# Patient Record
Sex: Male | Born: 1945
Health system: Southern US, Community
[De-identification: ages and names within clinical notes are randomized; demographics above are authoritative.]

## PROBLEM LIST (undated history)

## (undated) DIAGNOSIS — D509 Iron deficiency anemia, unspecified: Secondary | ICD-10-CM

## (undated) DIAGNOSIS — Z9289 Personal history of other medical treatment: Secondary | ICD-10-CM

## (undated) DIAGNOSIS — S060X9A Concussion with loss of consciousness of unspecified duration, initial encounter: Secondary | ICD-10-CM

## (undated) DIAGNOSIS — D126 Benign neoplasm of colon, unspecified: Secondary | ICD-10-CM

## (undated) DIAGNOSIS — H9190 Unspecified hearing loss, unspecified ear: Secondary | ICD-10-CM

## (undated) DIAGNOSIS — D649 Anemia, unspecified: Secondary | ICD-10-CM

## (undated) DIAGNOSIS — G40909 Epilepsy, unspecified, not intractable, without status epilepticus: Secondary | ICD-10-CM

## (undated) DIAGNOSIS — R55 Syncope and collapse: Secondary | ICD-10-CM

## (undated) DIAGNOSIS — I10 Essential (primary) hypertension: Secondary | ICD-10-CM

## (undated) DIAGNOSIS — I6782 Cerebral ischemia: Secondary | ICD-10-CM

## (undated) DIAGNOSIS — G4733 Obstructive sleep apnea (adult) (pediatric): Secondary | ICD-10-CM

## (undated) DIAGNOSIS — Z87442 Personal history of urinary calculi: Secondary | ICD-10-CM

## (undated) DIAGNOSIS — I471 Supraventricular tachycardia, unspecified: Secondary | ICD-10-CM

## (undated) DIAGNOSIS — Z973 Presence of spectacles and contact lenses: Secondary | ICD-10-CM

## (undated) DIAGNOSIS — N183 Chronic kidney disease, stage 3 unspecified: Secondary | ICD-10-CM

## (undated) DIAGNOSIS — M545 Low back pain: Secondary | ICD-10-CM

## (undated) DIAGNOSIS — K259 Gastric ulcer, unspecified as acute or chronic, without hemorrhage or perforation: Secondary | ICD-10-CM

## (undated) DIAGNOSIS — E034 Atrophy of thyroid (acquired): Secondary | ICD-10-CM

## (undated) DIAGNOSIS — F419 Anxiety disorder, unspecified: Secondary | ICD-10-CM

## (undated) DIAGNOSIS — E119 Type 2 diabetes mellitus without complications: Secondary | ICD-10-CM

## (undated) DIAGNOSIS — Z9114 Patient's other noncompliance with medication regimen: Secondary | ICD-10-CM

## (undated) DIAGNOSIS — I2510 Atherosclerotic heart disease of native coronary artery without angina pectoris: Secondary | ICD-10-CM

## (undated) DIAGNOSIS — M479 Spondylosis, unspecified: Secondary | ICD-10-CM

## (undated) DIAGNOSIS — F32A Depression, unspecified: Secondary | ICD-10-CM

## (undated) DIAGNOSIS — Z8489 Family history of other specified conditions: Secondary | ICD-10-CM

## (undated) DIAGNOSIS — Z91148 Patient's other noncompliance with medication regimen for other reason: Secondary | ICD-10-CM

## (undated) DIAGNOSIS — E785 Hyperlipidemia, unspecified: Secondary | ICD-10-CM

## (undated) DIAGNOSIS — R011 Cardiac murmur, unspecified: Secondary | ICD-10-CM

## (undated) DIAGNOSIS — I951 Orthostatic hypotension: Secondary | ICD-10-CM

## (undated) DIAGNOSIS — Z7901 Long term (current) use of anticoagulants: Secondary | ICD-10-CM

## (undated) DIAGNOSIS — I480 Paroxysmal atrial fibrillation: Secondary | ICD-10-CM

## (undated) DIAGNOSIS — E079 Disorder of thyroid, unspecified: Secondary | ICD-10-CM

## (undated) DIAGNOSIS — F329 Major depressive disorder, single episode, unspecified: Secondary | ICD-10-CM

## (undated) DIAGNOSIS — I739 Peripheral vascular disease, unspecified: Secondary | ICD-10-CM

## (undated) DIAGNOSIS — E559 Vitamin D deficiency, unspecified: Secondary | ICD-10-CM

## (undated) DIAGNOSIS — G2581 Restless legs syndrome: Secondary | ICD-10-CM

## (undated) DIAGNOSIS — K219 Gastro-esophageal reflux disease without esophagitis: Secondary | ICD-10-CM

## (undated) DIAGNOSIS — S060XAA Concussion with loss of consciousness status unknown, initial encounter: Secondary | ICD-10-CM

## (undated) DIAGNOSIS — N200 Calculus of kidney: Secondary | ICD-10-CM

## (undated) DIAGNOSIS — R001 Bradycardia, unspecified: Secondary | ICD-10-CM

## (undated) DIAGNOSIS — Z951 Presence of aortocoronary bypass graft: Secondary | ICD-10-CM

## (undated) DIAGNOSIS — R609 Edema, unspecified: Secondary | ICD-10-CM

## (undated) DIAGNOSIS — Z87820 Personal history of traumatic brain injury: Secondary | ICD-10-CM

## (undated) DIAGNOSIS — I4510 Unspecified right bundle-branch block: Secondary | ICD-10-CM

## (undated) DIAGNOSIS — R296 Repeated falls: Secondary | ICD-10-CM

## (undated) HISTORY — DX: Type 2 diabetes mellitus without complications: E11.9

## (undated) HISTORY — DX: Chronic kidney disease, stage 3 (moderate): N18.3

## (undated) HISTORY — DX: Restless legs syndrome: G25.81

## (undated) HISTORY — DX: Hyperlipidemia, unspecified: E78.5

## (undated) HISTORY — DX: Major depressive disorder, single episode, unspecified: F32.9

## (undated) HISTORY — DX: Repeated falls: R29.6

## (undated) HISTORY — PX: NASAL SEPTUM SURGERY: SHX37

## (undated) HISTORY — DX: Essential (primary) hypertension: I10

## (undated) HISTORY — DX: Personal history of traumatic brain injury: Z87.820

## (undated) HISTORY — DX: Presence of aortocoronary bypass graft: Z95.1

## (undated) HISTORY — DX: Bradycardia, unspecified: R00.1

## (undated) HISTORY — DX: Supraventricular tachycardia: I47.1

## (undated) HISTORY — DX: Long term (current) use of anticoagulants: Z79.01

## (undated) HISTORY — DX: Epilepsy, unspecified, not intractable, without status epilepticus: G40.909

## (undated) HISTORY — DX: Calculus of kidney: N20.0

## (undated) HISTORY — PX: ESOPHAGOGASTRODUODENOSCOPY: SHX1529

## (undated) HISTORY — DX: Atherosclerotic heart disease of native coronary artery without angina pectoris: I25.10

## (undated) HISTORY — DX: Atrophy of thyroid (acquired): E03.4

## (undated) HISTORY — DX: Chronic kidney disease, stage 3 unspecified: N18.30

## (undated) HISTORY — DX: Unspecified right bundle-branch block: I45.10

## (undated) HISTORY — PX: CORONARY ARTERY BYPASS GRAFT: SHX141

## (undated) HISTORY — DX: Cerebral ischemia: I67.82

## (undated) HISTORY — PX: COLONOSCOPY: SHX174

## (undated) HISTORY — DX: Supraventricular tachycardia, unspecified: I47.10

## (undated) HISTORY — DX: Paroxysmal atrial fibrillation: I48.0

## (undated) HISTORY — DX: Vitamin D deficiency, unspecified: E55.9

## (undated) HISTORY — DX: Low back pain: M54.5

## (undated) HISTORY — DX: Edema, unspecified: R60.9

## (undated) HISTORY — DX: Depression, unspecified: F32.A

## (undated) HISTORY — DX: Orthostatic hypotension: I95.1

## (undated) HISTORY — DX: Peripheral vascular disease, unspecified: I73.9

## (undated) HISTORY — DX: Anemia, unspecified: D64.9

## (undated) HISTORY — DX: Spondylosis, unspecified: M47.9

## (undated) HISTORY — DX: Benign neoplasm of colon, unspecified: D12.6

## (undated) HISTORY — DX: Personal history of other medical treatment: Z92.89

## (undated) HISTORY — PX: CATARACT EXTRACTION: SUR2

## (undated) HISTORY — DX: Obstructive sleep apnea (adult) (pediatric): G47.33

## (undated) HISTORY — DX: Iron deficiency anemia, unspecified: D50.9

## (undated) HISTORY — DX: Gastric ulcer, unspecified as acute or chronic, without hemorrhage or perforation: K25.9

---

## 1998-04-18 ENCOUNTER — Emergency Department (HOSPITAL_COMMUNITY): Admission: EM | Admit: 1998-04-18 | Discharge: 1998-04-18 | Payer: Self-pay | Admitting: Emergency Medicine

## 1998-12-11 ENCOUNTER — Encounter: Admission: RE | Admit: 1998-12-11 | Discharge: 1998-12-11 | Payer: Self-pay | Admitting: *Deleted

## 1999-01-11 ENCOUNTER — Encounter: Payer: Self-pay | Admitting: Emergency Medicine

## 1999-01-11 ENCOUNTER — Inpatient Hospital Stay (HOSPITAL_COMMUNITY): Admission: EM | Admit: 1999-01-11 | Discharge: 1999-01-13 | Payer: Self-pay | Admitting: Emergency Medicine

## 1999-09-07 ENCOUNTER — Encounter: Payer: Self-pay | Admitting: Emergency Medicine

## 1999-09-07 ENCOUNTER — Inpatient Hospital Stay (HOSPITAL_COMMUNITY): Admission: EM | Admit: 1999-09-07 | Discharge: 1999-09-11 | Payer: Self-pay | Admitting: Emergency Medicine

## 1999-09-08 ENCOUNTER — Encounter: Payer: Self-pay | Admitting: Cardiovascular Disease

## 1999-09-10 ENCOUNTER — Encounter: Payer: Self-pay | Admitting: Cardiovascular Disease

## 1999-09-18 ENCOUNTER — Encounter: Payer: Self-pay | Admitting: Cardiothoracic Surgery

## 1999-09-21 ENCOUNTER — Inpatient Hospital Stay (HOSPITAL_COMMUNITY): Admission: RE | Admit: 1999-09-21 | Discharge: 1999-09-28 | Payer: Self-pay | Admitting: Cardiothoracic Surgery

## 1999-09-21 ENCOUNTER — Encounter: Payer: Self-pay | Admitting: Cardiothoracic Surgery

## 1999-09-22 ENCOUNTER — Encounter: Payer: Self-pay | Admitting: Cardiothoracic Surgery

## 1999-09-23 ENCOUNTER — Encounter: Payer: Self-pay | Admitting: Cardiothoracic Surgery

## 1999-09-24 ENCOUNTER — Encounter: Payer: Self-pay | Admitting: Cardiovascular Disease

## 1999-09-25 ENCOUNTER — Encounter: Payer: Self-pay | Admitting: Cardiothoracic Surgery

## 1999-09-25 ENCOUNTER — Encounter: Payer: Self-pay | Admitting: Cardiovascular Disease

## 1999-09-26 ENCOUNTER — Encounter: Payer: Self-pay | Admitting: Cardiothoracic Surgery

## 1999-09-27 ENCOUNTER — Encounter: Payer: Self-pay | Admitting: Cardiothoracic Surgery

## 1999-09-28 ENCOUNTER — Encounter: Payer: Self-pay | Admitting: Cardiothoracic Surgery

## 1999-10-23 ENCOUNTER — Encounter: Admission: RE | Admit: 1999-10-23 | Discharge: 1999-10-23 | Payer: Self-pay | Admitting: Cardiothoracic Surgery

## 1999-10-23 ENCOUNTER — Encounter: Payer: Self-pay | Admitting: Cardiothoracic Surgery

## 1999-11-02 ENCOUNTER — Encounter: Admission: RE | Admit: 1999-11-02 | Discharge: 1999-12-16 | Payer: Self-pay | Admitting: Cardiothoracic Surgery

## 1999-11-10 ENCOUNTER — Encounter (HOSPITAL_COMMUNITY): Admission: RE | Admit: 1999-11-10 | Discharge: 2000-02-08 | Payer: Self-pay | Admitting: Interventional Cardiology

## 2000-02-09 ENCOUNTER — Encounter (HOSPITAL_COMMUNITY): Admission: RE | Admit: 2000-02-09 | Discharge: 2000-03-09 | Payer: Self-pay | Admitting: Interventional Cardiology

## 2000-02-20 ENCOUNTER — Encounter: Payer: Self-pay | Admitting: Pulmonary Disease

## 2000-02-20 ENCOUNTER — Ambulatory Visit: Admission: RE | Admit: 2000-02-20 | Discharge: 2000-02-20 | Payer: Self-pay | Admitting: Internal Medicine

## 2000-05-15 ENCOUNTER — Ambulatory Visit (HOSPITAL_BASED_OUTPATIENT_CLINIC_OR_DEPARTMENT_OTHER): Admission: RE | Admit: 2000-05-15 | Discharge: 2000-05-15 | Payer: Self-pay | Admitting: Pulmonary Disease

## 2000-05-15 ENCOUNTER — Encounter: Payer: Self-pay | Admitting: Pulmonary Disease

## 2000-08-11 ENCOUNTER — Encounter: Payer: Self-pay | Admitting: Emergency Medicine

## 2000-08-11 ENCOUNTER — Inpatient Hospital Stay (HOSPITAL_COMMUNITY): Admission: EM | Admit: 2000-08-11 | Discharge: 2000-08-13 | Payer: Self-pay | Admitting: Emergency Medicine

## 2001-01-10 ENCOUNTER — Encounter: Payer: Self-pay | Admitting: Otolaryngology

## 2001-01-12 ENCOUNTER — Inpatient Hospital Stay (HOSPITAL_COMMUNITY): Admission: RE | Admit: 2001-01-12 | Discharge: 2001-01-13 | Payer: Self-pay | Admitting: Otolaryngology

## 2001-01-12 ENCOUNTER — Encounter (INDEPENDENT_AMBULATORY_CARE_PROVIDER_SITE_OTHER): Payer: Self-pay | Admitting: *Deleted

## 2001-04-07 ENCOUNTER — Ambulatory Visit (HOSPITAL_BASED_OUTPATIENT_CLINIC_OR_DEPARTMENT_OTHER): Admission: RE | Admit: 2001-04-07 | Discharge: 2001-04-07 | Payer: Self-pay | Admitting: Otolaryngology

## 2002-01-19 ENCOUNTER — Ambulatory Visit (HOSPITAL_COMMUNITY): Admission: RE | Admit: 2002-01-19 | Discharge: 2002-01-19 | Payer: Self-pay

## 2002-05-31 ENCOUNTER — Inpatient Hospital Stay (HOSPITAL_COMMUNITY): Admission: EM | Admit: 2002-05-31 | Discharge: 2002-06-04 | Payer: Self-pay | Admitting: Emergency Medicine

## 2002-05-31 ENCOUNTER — Encounter: Payer: Self-pay | Admitting: Internal Medicine

## 2002-06-02 ENCOUNTER — Encounter: Payer: Self-pay | Admitting: Interventional Cardiology

## 2002-06-03 ENCOUNTER — Encounter: Payer: Self-pay | Admitting: Interventional Cardiology

## 2002-07-02 ENCOUNTER — Encounter: Admission: RE | Admit: 2002-07-02 | Discharge: 2002-07-02 | Payer: Self-pay | Admitting: Internal Medicine

## 2002-07-02 ENCOUNTER — Encounter: Payer: Self-pay | Admitting: Internal Medicine

## 2002-08-21 ENCOUNTER — Ambulatory Visit (HOSPITAL_COMMUNITY): Admission: RE | Admit: 2002-08-21 | Discharge: 2002-08-21 | Payer: Self-pay | Admitting: Interventional Cardiology

## 2002-08-21 ENCOUNTER — Encounter: Payer: Self-pay | Admitting: Interventional Cardiology

## 2004-04-02 ENCOUNTER — Observation Stay (HOSPITAL_COMMUNITY): Admission: EM | Admit: 2004-04-02 | Discharge: 2004-04-03 | Payer: Self-pay | Admitting: Emergency Medicine

## 2004-05-28 ENCOUNTER — Ambulatory Visit (HOSPITAL_COMMUNITY): Admission: RE | Admit: 2004-05-28 | Discharge: 2004-05-28 | Payer: Self-pay | Admitting: Interventional Cardiology

## 2004-06-11 ENCOUNTER — Ambulatory Visit (HOSPITAL_COMMUNITY): Admission: RE | Admit: 2004-06-11 | Discharge: 2004-06-11 | Payer: Self-pay | Admitting: Internal Medicine

## 2004-06-19 ENCOUNTER — Encounter: Admission: RE | Admit: 2004-06-19 | Discharge: 2004-06-19 | Payer: Self-pay | Admitting: Internal Medicine

## 2004-07-15 ENCOUNTER — Ambulatory Visit (HOSPITAL_COMMUNITY): Admission: RE | Admit: 2004-07-15 | Discharge: 2004-07-15 | Payer: Self-pay | Admitting: Internal Medicine

## 2005-01-18 ENCOUNTER — Inpatient Hospital Stay (HOSPITAL_COMMUNITY): Admission: EM | Admit: 2005-01-18 | Discharge: 2005-01-20 | Payer: Self-pay | Admitting: Emergency Medicine

## 2005-07-30 ENCOUNTER — Emergency Department (HOSPITAL_COMMUNITY): Admission: EM | Admit: 2005-07-30 | Discharge: 2005-07-31 | Payer: Self-pay | Admitting: Emergency Medicine

## 2005-08-04 ENCOUNTER — Encounter: Admission: RE | Admit: 2005-08-04 | Discharge: 2005-08-04 | Payer: Self-pay | Admitting: Emergency Medicine

## 2005-08-04 ENCOUNTER — Emergency Department (HOSPITAL_COMMUNITY): Admission: EM | Admit: 2005-08-04 | Discharge: 2005-08-04 | Payer: Self-pay | Admitting: Emergency Medicine

## 2005-08-20 ENCOUNTER — Ambulatory Visit: Payer: Self-pay | Admitting: Cardiology

## 2005-08-26 ENCOUNTER — Inpatient Hospital Stay (HOSPITAL_BASED_OUTPATIENT_CLINIC_OR_DEPARTMENT_OTHER): Admission: RE | Admit: 2005-08-26 | Discharge: 2005-08-26 | Payer: Self-pay | Admitting: Cardiology

## 2005-08-26 ENCOUNTER — Ambulatory Visit: Payer: Self-pay | Admitting: Cardiology

## 2005-09-08 ENCOUNTER — Ambulatory Visit: Payer: Self-pay | Admitting: Cardiology

## 2005-09-28 ENCOUNTER — Ambulatory Visit: Payer: Self-pay | Admitting: Internal Medicine

## 2005-10-12 ENCOUNTER — Ambulatory Visit: Payer: Self-pay | Admitting: Internal Medicine

## 2005-10-22 ENCOUNTER — Ambulatory Visit: Payer: Self-pay | Admitting: Gastroenterology

## 2005-10-26 ENCOUNTER — Ambulatory Visit: Payer: Self-pay | Admitting: Internal Medicine

## 2005-11-26 ENCOUNTER — Ambulatory Visit: Payer: Self-pay | Admitting: Gastroenterology

## 2005-12-06 DIAGNOSIS — D126 Benign neoplasm of colon, unspecified: Secondary | ICD-10-CM

## 2005-12-06 HISTORY — DX: Benign neoplasm of colon, unspecified: D12.6

## 2005-12-08 ENCOUNTER — Encounter (INDEPENDENT_AMBULATORY_CARE_PROVIDER_SITE_OTHER): Payer: Self-pay | Admitting: Specialist

## 2005-12-08 ENCOUNTER — Ambulatory Visit: Payer: Self-pay | Admitting: Gastroenterology

## 2005-12-10 ENCOUNTER — Ambulatory Visit: Payer: Self-pay | Admitting: Cardiology

## 2005-12-31 ENCOUNTER — Ambulatory Visit: Payer: Self-pay | Admitting: Internal Medicine

## 2006-01-05 ENCOUNTER — Ambulatory Visit: Payer: Self-pay | Admitting: Internal Medicine

## 2006-01-07 ENCOUNTER — Ambulatory Visit: Payer: Self-pay | Admitting: Cardiology

## 2006-03-01 ENCOUNTER — Observation Stay (HOSPITAL_COMMUNITY): Admission: EM | Admit: 2006-03-01 | Discharge: 2006-03-03 | Payer: Self-pay | Admitting: Emergency Medicine

## 2006-03-01 ENCOUNTER — Ambulatory Visit: Payer: Self-pay | Admitting: Cardiology

## 2006-03-22 ENCOUNTER — Ambulatory Visit: Payer: Self-pay | Admitting: Cardiology

## 2006-03-25 ENCOUNTER — Ambulatory Visit: Payer: Self-pay | Admitting: Internal Medicine

## 2006-04-01 ENCOUNTER — Ambulatory Visit: Payer: Self-pay | Admitting: Cardiology

## 2006-04-28 ENCOUNTER — Ambulatory Visit: Payer: Self-pay | Admitting: Cardiology

## 2006-09-03 ENCOUNTER — Emergency Department (HOSPITAL_COMMUNITY): Admission: EM | Admit: 2006-09-03 | Discharge: 2006-09-04 | Payer: Self-pay | Admitting: Emergency Medicine

## 2006-11-25 ENCOUNTER — Ambulatory Visit: Payer: Self-pay | Admitting: Cardiology

## 2007-02-07 ENCOUNTER — Ambulatory Visit: Payer: Self-pay | Admitting: Cardiology

## 2007-02-15 ENCOUNTER — Ambulatory Visit: Payer: Self-pay

## 2007-03-14 ENCOUNTER — Ambulatory Visit: Payer: Self-pay | Admitting: Internal Medicine

## 2007-03-14 LAB — CONVERTED CEMR LAB
ALT: 29 units/L (ref 0–40)
AST: 26 units/L (ref 0–37)
Basophils Relative: 1 % (ref 0.0–1.0)
Bilirubin, Direct: 0.2 mg/dL (ref 0.0–0.3)
CO2: 30 meq/L (ref 19–32)
Calcium: 8.9 mg/dL (ref 8.4–10.5)
Chloride: 111 meq/L (ref 96–112)
Creatinine, Ser: 0.9 mg/dL (ref 0.4–1.5)
Eosinophils Relative: 2.4 % (ref 0.0–5.0)
Glucose, Bld: 78 mg/dL (ref 70–99)
HCT: 42.2 % (ref 39.0–52.0)
HDL: 32.3 mg/dL — ABNORMAL LOW (ref >39.0)
Ketones, ur: NEGATIVE mg/dL
Leukocytes, UA: NEGATIVE
Neutrophils Relative %: 58.6 % (ref 43.0–77.0)
Platelets: 199 10*3/uL (ref 150–400)
RBC: 4.63 M/uL (ref 4.22–5.81)
Specific Gravity, Urine: 1.025 (ref 1.000–1.03)
Total Bilirubin: 0.8 mg/dL (ref 0.3–1.2)
Total Protein: 6.5 g/dL (ref 6.0–8.3)
Triglycerides: 76 mg/dL (ref 0–149)
Urine Glucose: NEGATIVE mg/dL
VLDL: 15 mg/dL (ref 0–40)
WBC: 6.7 10*3/uL (ref 4.5–10.5)

## 2007-03-16 ENCOUNTER — Ambulatory Visit: Payer: Self-pay | Admitting: Internal Medicine

## 2007-04-20 ENCOUNTER — Ambulatory Visit: Payer: Self-pay | Admitting: Internal Medicine

## 2007-05-31 ENCOUNTER — Ambulatory Visit: Payer: Self-pay | Admitting: Internal Medicine

## 2007-06-06 ENCOUNTER — Ambulatory Visit: Payer: Self-pay | Admitting: Internal Medicine

## 2007-08-28 ENCOUNTER — Ambulatory Visit: Payer: Self-pay | Admitting: Cardiology

## 2007-12-20 DIAGNOSIS — Z87442 Personal history of urinary calculi: Secondary | ICD-10-CM | POA: Insufficient documentation

## 2007-12-27 ENCOUNTER — Emergency Department (HOSPITAL_COMMUNITY): Admission: EM | Admit: 2007-12-27 | Discharge: 2007-12-27 | Payer: Self-pay | Admitting: Emergency Medicine

## 2008-02-20 ENCOUNTER — Encounter: Payer: Self-pay | Admitting: Internal Medicine

## 2008-02-21 ENCOUNTER — Ambulatory Visit: Payer: Self-pay | Admitting: Cardiology

## 2008-02-21 LAB — CONVERTED CEMR LAB
Basophils Absolute: 0.1 10*3/uL (ref 0.0–0.1)
CO2: 26 meq/L (ref 19–32)
Creatinine, Ser: 1.1 mg/dL (ref 0.4–1.5)
HCT: 42 % (ref 39.0–52.0)
Hemoglobin: 13.9 g/dL (ref 13.0–17.0)
INR: 0.8 (ref 0.8–1.0)
Lymphocytes Relative: 28.6 % (ref 12.0–46.0)
MCHC: 33.1 g/dL (ref 30.0–36.0)
MCV: 93.7 fL (ref 78.0–100.0)
Monocytes Absolute: 0.6 10*3/uL (ref 0.2–0.7)
Neutrophils Relative %: 61.5 % (ref 43.0–77.0)
Potassium: 4.6 meq/L (ref 3.5–5.1)
RDW: 12.2 % (ref 11.5–14.6)
Sodium: 136 meq/L (ref 135–145)
aPTT: 26.1 s (ref 21.7–29.8)

## 2008-02-23 ENCOUNTER — Ambulatory Visit: Payer: Self-pay | Admitting: Cardiology

## 2008-02-23 ENCOUNTER — Inpatient Hospital Stay (HOSPITAL_BASED_OUTPATIENT_CLINIC_OR_DEPARTMENT_OTHER): Admission: RE | Admit: 2008-02-23 | Discharge: 2008-02-23 | Payer: Self-pay | Admitting: Cardiology

## 2008-02-28 ENCOUNTER — Ambulatory Visit: Payer: Self-pay | Admitting: Cardiology

## 2008-03-11 ENCOUNTER — Ambulatory Visit: Payer: Self-pay | Admitting: Cardiology

## 2008-03-27 ENCOUNTER — Ambulatory Visit: Payer: Self-pay | Admitting: Cardiology

## 2008-03-29 ENCOUNTER — Ambulatory Visit: Payer: Self-pay | Admitting: Cardiology

## 2008-04-05 ENCOUNTER — Ambulatory Visit: Payer: Self-pay | Admitting: Internal Medicine

## 2008-04-05 LAB — CONVERTED CEMR LAB
AST: 23 units/L (ref 0–37)
Albumin: 4.5 g/dL (ref 3.5–5.2)
Alkaline Phosphatase: 73 units/L (ref 39–117)
BUN: 14 mg/dL (ref 6–23)
Chloride: 107 meq/L (ref 96–112)
Eosinophils Relative: 3.2 % (ref 0.0–5.0)
GFR calc non Af Amer: 92 mL/min
Glucose, Bld: 185 mg/dL — ABNORMAL HIGH (ref 70–99)
Hemoglobin, Urine: NEGATIVE
LDL Cholesterol: 60 mg/dL (ref 0–99)
Monocytes Relative: 7.9 % (ref 3.0–12.0)
Neutrophils Relative %: 58.4 % (ref 43.0–77.0)
Nitrite: NEGATIVE
Platelets: 198 10*3/uL (ref 150–400)
Potassium: 4.3 meq/L (ref 3.5–5.1)
Total Protein: 7.2 g/dL (ref 6.0–8.3)
Urobilinogen, UA: 0.2 (ref 0.0–1.0)
VLDL: 28 mg/dL (ref 0–40)
WBC: 7.1 10*3/uL (ref 4.5–10.5)

## 2008-04-08 ENCOUNTER — Telehealth (INDEPENDENT_AMBULATORY_CARE_PROVIDER_SITE_OTHER): Payer: Self-pay | Admitting: *Deleted

## 2008-04-10 ENCOUNTER — Ambulatory Visit: Payer: Self-pay | Admitting: Internal Medicine

## 2008-04-11 ENCOUNTER — Encounter: Payer: Self-pay | Admitting: Internal Medicine

## 2008-04-15 ENCOUNTER — Ambulatory Visit: Payer: Self-pay | Admitting: Cardiology

## 2008-05-03 ENCOUNTER — Ambulatory Visit: Payer: Self-pay | Admitting: Cardiology

## 2008-06-06 ENCOUNTER — Ambulatory Visit: Payer: Self-pay | Admitting: Cardiology

## 2008-06-12 ENCOUNTER — Encounter: Payer: Self-pay | Admitting: Internal Medicine

## 2008-06-21 ENCOUNTER — Ambulatory Visit: Payer: Self-pay | Admitting: Cardiology

## 2008-07-17 ENCOUNTER — Ambulatory Visit: Payer: Self-pay | Admitting: Cardiology

## 2008-08-23 ENCOUNTER — Ambulatory Visit: Payer: Self-pay

## 2008-08-23 ENCOUNTER — Encounter: Payer: Self-pay | Admitting: Cardiology

## 2008-08-23 ENCOUNTER — Encounter: Payer: Self-pay | Admitting: Internal Medicine

## 2008-08-29 ENCOUNTER — Ambulatory Visit: Payer: Self-pay | Admitting: Cardiology

## 2008-10-06 HISTORY — PX: PERCUTANEOUS CORONARY STENT INTERVENTION (PCI-S): SHX6016

## 2008-10-10 ENCOUNTER — Ambulatory Visit: Payer: Self-pay | Admitting: Cardiology

## 2008-10-23 ENCOUNTER — Ambulatory Visit: Payer: Self-pay | Admitting: Cardiology

## 2008-10-23 LAB — CONVERTED CEMR LAB
BUN: 10 mg/dL (ref 6–23)
Basophils Absolute: 0.1 10*3/uL (ref 0.0–0.1)
Basophils Relative: 0.9 % (ref 0.0–3.0)
CO2: 25 meq/L (ref 19–32)
Calcium: 9.7 mg/dL (ref 8.4–10.5)
Creatinine, Ser: 1 mg/dL (ref 0.4–1.5)
Eosinophils Relative: 5.6 % — ABNORMAL HIGH (ref 0.0–5.0)
Glucose, Bld: 256 mg/dL — ABNORMAL HIGH (ref 70–99)
Hemoglobin: 13.9 g/dL (ref 13.0–17.0)
Lymphocytes Relative: 30.1 % (ref 12.0–46.0)
MCHC: 34.6 g/dL (ref 30.0–36.0)
Neutro Abs: 3.1 10*3/uL (ref 1.4–7.7)
Prothrombin Time: 11 s (ref 10.9–13.3)
RBC: 4.21 M/uL — ABNORMAL LOW (ref 4.22–5.81)
WBC: 6.4 10*3/uL (ref 4.5–10.5)

## 2008-10-29 ENCOUNTER — Ambulatory Visit: Payer: Self-pay | Admitting: Cardiology

## 2008-10-29 ENCOUNTER — Encounter: Payer: Self-pay | Admitting: Cardiology

## 2008-10-29 ENCOUNTER — Inpatient Hospital Stay (HOSPITAL_BASED_OUTPATIENT_CLINIC_OR_DEPARTMENT_OTHER): Admission: RE | Admit: 2008-10-29 | Discharge: 2008-10-29 | Payer: Self-pay | Admitting: Cardiology

## 2008-10-29 DIAGNOSIS — Z955 Presence of coronary angioplasty implant and graft: Secondary | ICD-10-CM

## 2008-11-08 ENCOUNTER — Ambulatory Visit: Payer: Self-pay | Admitting: Cardiology

## 2008-11-25 ENCOUNTER — Ambulatory Visit: Payer: Self-pay | Admitting: Cardiology

## 2008-11-25 LAB — CONVERTED CEMR LAB
Basophils Relative: 0.6 % (ref 0.0–3.0)
CO2: 28 meq/L (ref 19–32)
Calcium: 9.2 mg/dL (ref 8.4–10.5)
Creatinine, Ser: 1.2 mg/dL (ref 0.4–1.5)
Glucose, Bld: 355 mg/dL — ABNORMAL HIGH (ref 70–99)
HCT: 42.2 % (ref 39.0–52.0)
Hemoglobin: 15 g/dL (ref 13.0–17.0)
Lymphocytes Relative: 28.3 % (ref 12.0–46.0)
MCHC: 35.5 g/dL (ref 30.0–36.0)
Monocytes Absolute: 0.5 10*3/uL (ref 0.1–1.0)
Monocytes Relative: 6.6 % (ref 3.0–12.0)
Neutro Abs: 4.8 10*3/uL (ref 1.4–7.7)
Prothrombin Time: 10.6 s — ABNORMAL LOW (ref 10.9–13.3)
RBC: 4.59 M/uL (ref 4.22–5.81)

## 2008-11-26 ENCOUNTER — Ambulatory Visit: Payer: Self-pay | Admitting: Cardiology

## 2008-11-26 ENCOUNTER — Inpatient Hospital Stay (HOSPITAL_COMMUNITY): Admission: AD | Admit: 2008-11-26 | Discharge: 2008-11-27 | Payer: Self-pay | Admitting: Cardiology

## 2008-12-12 ENCOUNTER — Ambulatory Visit: Payer: Self-pay | Admitting: Cardiology

## 2008-12-16 ENCOUNTER — Ambulatory Visit: Payer: Self-pay | Admitting: Internal Medicine

## 2008-12-16 LAB — CONVERTED CEMR LAB
Chloride: 107 meq/L (ref 96–112)
Creatinine,U: 110.3 mg/dL
GFR calc non Af Amer: 105 mL/min
Hgb A1c MFr Bld: 10.3 % — ABNORMAL HIGH (ref 4.6–6.0)
LDL Cholesterol: 49 mg/dL (ref 0–99)
Microalb Creat Ratio: 5.4 mg/g (ref 0.0–30.0)
Microalb, Ur: 0.6 mg/dL (ref 0.0–1.9)
Potassium: 4.4 meq/L (ref 3.5–5.1)
Sodium: 141 meq/L (ref 135–145)
Total CHOL/HDL Ratio: 3.4

## 2008-12-18 ENCOUNTER — Ambulatory Visit: Payer: Self-pay | Admitting: Internal Medicine

## 2009-01-08 ENCOUNTER — Telehealth (INDEPENDENT_AMBULATORY_CARE_PROVIDER_SITE_OTHER): Payer: Self-pay | Admitting: *Deleted

## 2009-01-08 DIAGNOSIS — Z860100 Personal history of colon polyps, unspecified: Secondary | ICD-10-CM | POA: Insufficient documentation

## 2009-01-09 ENCOUNTER — Ambulatory Visit: Payer: Self-pay | Admitting: Gastroenterology

## 2009-01-09 DIAGNOSIS — K219 Gastro-esophageal reflux disease without esophagitis: Secondary | ICD-10-CM | POA: Insufficient documentation

## 2009-01-13 ENCOUNTER — Telehealth: Payer: Self-pay | Admitting: Internal Medicine

## 2009-01-17 ENCOUNTER — Ambulatory Visit: Payer: Self-pay | Admitting: *Deleted

## 2009-01-17 DIAGNOSIS — R51 Headache: Secondary | ICD-10-CM | POA: Insufficient documentation

## 2009-01-17 DIAGNOSIS — R42 Dizziness and giddiness: Secondary | ICD-10-CM | POA: Insufficient documentation

## 2009-01-17 DIAGNOSIS — M5430 Sciatica, unspecified side: Secondary | ICD-10-CM | POA: Insufficient documentation

## 2009-01-17 DIAGNOSIS — R519 Headache, unspecified: Secondary | ICD-10-CM | POA: Insufficient documentation

## 2009-01-20 ENCOUNTER — Ambulatory Visit: Payer: Self-pay | Admitting: Internal Medicine

## 2009-02-10 ENCOUNTER — Ambulatory Visit: Payer: Self-pay | Admitting: Gastroenterology

## 2009-02-10 ENCOUNTER — Encounter: Payer: Self-pay | Admitting: Gastroenterology

## 2009-02-10 LAB — HM COLONOSCOPY

## 2009-02-10 LAB — CONVERTED CEMR LAB: UREASE: NEGATIVE

## 2009-02-11 ENCOUNTER — Encounter: Payer: Self-pay | Admitting: Gastroenterology

## 2009-03-15 ENCOUNTER — Emergency Department (HOSPITAL_BASED_OUTPATIENT_CLINIC_OR_DEPARTMENT_OTHER): Admission: EM | Admit: 2009-03-15 | Discharge: 2009-03-15 | Payer: Self-pay | Admitting: Emergency Medicine

## 2009-03-17 ENCOUNTER — Ambulatory Visit: Payer: Self-pay | Admitting: Internal Medicine

## 2009-03-18 ENCOUNTER — Ambulatory Visit: Payer: Self-pay | Admitting: Internal Medicine

## 2009-06-05 ENCOUNTER — Ambulatory Visit: Payer: Self-pay | Admitting: Internal Medicine

## 2009-06-05 LAB — CONVERTED CEMR LAB
ALT: 17 units/L (ref 0–53)
AST: 18 units/L (ref 0–37)
BUN: 15 mg/dL (ref 6–23)
Chloride: 108 meq/L (ref 96–112)
Cholesterol: 97 mg/dL (ref 0–200)
Creatinine,U: 74.5 mg/dL
Glucose, Bld: 83 mg/dL (ref 70–99)
LDL Cholesterol: 57 mg/dL (ref 0–99)
Microalb, Ur: 0.1 mg/dL (ref 0.0–1.9)
Potassium: 4.1 meq/L (ref 3.5–5.1)
Total Bilirubin: 0.6 mg/dL (ref 0.3–1.2)

## 2009-06-12 ENCOUNTER — Ambulatory Visit: Payer: Self-pay | Admitting: Internal Medicine

## 2009-06-12 DIAGNOSIS — M545 Low back pain, unspecified: Secondary | ICD-10-CM | POA: Insufficient documentation

## 2009-06-12 HISTORY — DX: Low back pain, unspecified: M54.50

## 2009-06-19 ENCOUNTER — Telehealth: Payer: Self-pay | Admitting: Internal Medicine

## 2009-06-20 ENCOUNTER — Encounter: Payer: Self-pay | Admitting: Internal Medicine

## 2009-06-25 ENCOUNTER — Ambulatory Visit: Payer: Self-pay | Admitting: Diagnostic Radiology

## 2009-06-25 ENCOUNTER — Ambulatory Visit (HOSPITAL_BASED_OUTPATIENT_CLINIC_OR_DEPARTMENT_OTHER): Admission: RE | Admit: 2009-06-25 | Discharge: 2009-06-25 | Payer: Self-pay | Admitting: Internal Medicine

## 2009-06-25 ENCOUNTER — Ambulatory Visit: Payer: Self-pay | Admitting: Internal Medicine

## 2009-06-26 ENCOUNTER — Encounter: Payer: Self-pay | Admitting: Internal Medicine

## 2009-06-27 ENCOUNTER — Encounter: Payer: Self-pay | Admitting: Internal Medicine

## 2009-06-30 ENCOUNTER — Telehealth: Payer: Self-pay | Admitting: Internal Medicine

## 2009-07-04 ENCOUNTER — Ambulatory Visit: Payer: Self-pay

## 2009-07-04 ENCOUNTER — Encounter: Payer: Self-pay | Admitting: Internal Medicine

## 2009-07-09 ENCOUNTER — Telehealth: Payer: Self-pay | Admitting: Internal Medicine

## 2009-07-09 ENCOUNTER — Encounter: Payer: Self-pay | Admitting: Cardiology

## 2009-07-09 DIAGNOSIS — F329 Major depressive disorder, single episode, unspecified: Secondary | ICD-10-CM

## 2009-07-09 DIAGNOSIS — I4510 Unspecified right bundle-branch block: Secondary | ICD-10-CM

## 2009-07-09 DIAGNOSIS — F418 Other specified anxiety disorders: Secondary | ICD-10-CM | POA: Insufficient documentation

## 2009-07-09 HISTORY — DX: Unspecified right bundle-branch block: I45.10

## 2009-07-10 ENCOUNTER — Ambulatory Visit: Payer: Self-pay | Admitting: Cardiology

## 2009-07-17 ENCOUNTER — Telehealth: Payer: Self-pay | Admitting: Internal Medicine

## 2009-07-31 LAB — CONVERTED CEMR LAB: Folate: >20 ng/mL

## 2009-08-10 ENCOUNTER — Ambulatory Visit: Payer: Self-pay | Admitting: Diagnostic Radiology

## 2009-08-10 ENCOUNTER — Emergency Department (HOSPITAL_BASED_OUTPATIENT_CLINIC_OR_DEPARTMENT_OTHER): Admission: EM | Admit: 2009-08-10 | Discharge: 2009-08-10 | Payer: Self-pay | Admitting: Emergency Medicine

## 2009-08-19 ENCOUNTER — Telehealth: Payer: Self-pay | Admitting: Cardiology

## 2009-09-12 ENCOUNTER — Ambulatory Visit: Payer: Self-pay | Admitting: Internal Medicine

## 2009-09-12 LAB — CONVERTED CEMR LAB
CO2: 24 meq/L (ref 19–32)
Calcium: 9.4 mg/dL (ref 8.4–10.5)
Chloride: 110 meq/L (ref 96–112)
Glucose, Bld: 88 mg/dL (ref 70–99)
Potassium: 4.5 meq/L (ref 3.5–5.3)
Sodium: 144 meq/L (ref 135–145)

## 2009-09-19 ENCOUNTER — Ambulatory Visit: Payer: Self-pay | Admitting: Internal Medicine

## 2009-09-19 DIAGNOSIS — G2581 Restless legs syndrome: Secondary | ICD-10-CM

## 2009-09-19 DIAGNOSIS — H8110 Benign paroxysmal vertigo, unspecified ear: Secondary | ICD-10-CM | POA: Insufficient documentation

## 2009-09-19 HISTORY — DX: Restless legs syndrome: G25.81

## 2009-09-22 ENCOUNTER — Telehealth: Payer: Self-pay | Admitting: Internal Medicine

## 2009-09-24 ENCOUNTER — Encounter: Admission: RE | Admit: 2009-09-24 | Discharge: 2009-10-07 | Payer: Self-pay | Admitting: Internal Medicine

## 2009-10-06 ENCOUNTER — Encounter: Payer: Self-pay | Admitting: Internal Medicine

## 2009-10-07 ENCOUNTER — Encounter: Payer: Self-pay | Admitting: Internal Medicine

## 2009-10-10 ENCOUNTER — Ambulatory Visit: Payer: Self-pay | Admitting: Internal Medicine

## 2009-10-14 ENCOUNTER — Ambulatory Visit: Payer: Self-pay | Admitting: Cardiology

## 2009-10-14 DIAGNOSIS — R609 Edema, unspecified: Secondary | ICD-10-CM | POA: Insufficient documentation

## 2009-10-15 ENCOUNTER — Telehealth: Payer: Self-pay | Admitting: Internal Medicine

## 2009-10-15 ENCOUNTER — Ambulatory Visit: Payer: Self-pay | Admitting: Diagnostic Radiology

## 2009-10-15 ENCOUNTER — Ambulatory Visit (HOSPITAL_BASED_OUTPATIENT_CLINIC_OR_DEPARTMENT_OTHER): Admission: RE | Admit: 2009-10-15 | Discharge: 2009-10-15 | Payer: Self-pay | Admitting: Internal Medicine

## 2009-10-20 ENCOUNTER — Encounter: Payer: Self-pay | Admitting: Cardiology

## 2009-10-23 ENCOUNTER — Telehealth: Payer: Self-pay | Admitting: Internal Medicine

## 2009-11-05 ENCOUNTER — Encounter: Payer: Self-pay | Admitting: Cardiology

## 2009-11-14 ENCOUNTER — Ambulatory Visit: Payer: Self-pay | Admitting: Internal Medicine

## 2009-11-16 ENCOUNTER — Encounter: Payer: Self-pay | Admitting: Cardiology

## 2009-11-17 ENCOUNTER — Ambulatory Visit: Payer: Self-pay | Admitting: Cardiology

## 2009-11-21 ENCOUNTER — Ambulatory Visit: Payer: Self-pay | Admitting: Internal Medicine

## 2009-12-19 ENCOUNTER — Ambulatory Visit: Payer: Self-pay | Admitting: Internal Medicine

## 2009-12-23 ENCOUNTER — Ambulatory Visit: Payer: Self-pay | Admitting: Cardiology

## 2009-12-24 ENCOUNTER — Ambulatory Visit: Payer: Self-pay | Admitting: Cardiology

## 2009-12-25 ENCOUNTER — Inpatient Hospital Stay (HOSPITAL_COMMUNITY): Admission: AD | Admit: 2009-12-25 | Discharge: 2009-12-27 | Payer: Self-pay | Admitting: Cardiology

## 2009-12-25 ENCOUNTER — Ambulatory Visit: Payer: Self-pay | Admitting: Cardiology

## 2009-12-25 ENCOUNTER — Ambulatory Visit: Payer: Self-pay | Admitting: Gastroenterology

## 2009-12-25 ENCOUNTER — Encounter: Payer: Self-pay | Admitting: Cardiology

## 2009-12-25 LAB — CONVERTED CEMR LAB
BUN: 14 mg/dL (ref 6–23)
Basophils Absolute: 0 10*3/uL (ref 0.0–0.1)
CO2: 26 meq/L (ref 19–32)
Chloride: 108 meq/L (ref 96–112)
Glucose, Bld: 87 mg/dL (ref 70–99)
HCT: 23.7 % — CL (ref 39.0–52.0)
Lymphs Abs: 1.9 10*3/uL (ref 0.7–4.0)
MCV: 71.6 fL — ABNORMAL LOW (ref 78.0–100.0)
Monocytes Absolute: 0.6 10*3/uL (ref 0.1–1.0)
Neutro Abs: 3.9 10*3/uL (ref 1.4–7.7)
Platelets: 202 10*3/uL (ref 150.0–400.0)
Potassium: 4.2 meq/L (ref 3.5–5.1)
Prothrombin Time: 11.1 s (ref 9.1–11.7)
RDW: 17.7 % — ABNORMAL HIGH (ref 11.5–14.6)

## 2009-12-26 ENCOUNTER — Encounter: Payer: Self-pay | Admitting: Cardiology

## 2009-12-26 ENCOUNTER — Encounter: Payer: Self-pay | Admitting: Gastroenterology

## 2009-12-30 ENCOUNTER — Telehealth: Payer: Self-pay | Admitting: Cardiology

## 2009-12-30 ENCOUNTER — Telehealth: Payer: Self-pay | Admitting: Gastroenterology

## 2009-12-31 ENCOUNTER — Encounter: Payer: Self-pay | Admitting: Cardiology

## 2009-12-31 ENCOUNTER — Telehealth: Payer: Self-pay | Admitting: Gastroenterology

## 2010-01-01 ENCOUNTER — Ambulatory Visit: Payer: Self-pay | Admitting: Cardiology

## 2010-01-02 ENCOUNTER — Encounter: Payer: Self-pay | Admitting: Gastroenterology

## 2010-01-04 LAB — CONVERTED CEMR LAB
Basophils Absolute: 0.1 10*3/uL (ref 0.0–0.1)
Eosinophils Absolute: 0.3 10*3/uL (ref 0.0–0.7)
Hemoglobin: 10.2 g/dL — ABNORMAL LOW (ref 13.0–17.0)
Lymphocytes Relative: 31.1 % (ref 12.0–46.0)
MCHC: 31.6 g/dL (ref 30.0–36.0)
MCV: 76.1 fL — ABNORMAL LOW (ref 78.0–100.0)
Monocytes Absolute: 0.3 10*3/uL (ref 0.1–1.0)
Neutro Abs: 3.5 10*3/uL (ref 1.4–7.7)
Neutrophils Relative %: 57.6 % (ref 43.0–77.0)
RDW: 20.5 % — ABNORMAL HIGH (ref 11.5–14.6)

## 2010-01-05 ENCOUNTER — Telehealth (INDEPENDENT_AMBULATORY_CARE_PROVIDER_SITE_OTHER): Payer: Self-pay | Admitting: *Deleted

## 2010-01-07 ENCOUNTER — Encounter: Payer: Self-pay | Admitting: Gastroenterology

## 2010-01-07 ENCOUNTER — Ambulatory Visit: Payer: Self-pay | Admitting: Internal Medicine

## 2010-01-09 ENCOUNTER — Telehealth: Payer: Self-pay | Admitting: Gastroenterology

## 2010-01-14 ENCOUNTER — Telehealth: Payer: Self-pay | Admitting: Gastroenterology

## 2010-01-19 ENCOUNTER — Telehealth: Payer: Self-pay | Admitting: Internal Medicine

## 2010-01-21 ENCOUNTER — Telehealth: Payer: Self-pay | Admitting: Internal Medicine

## 2010-01-22 ENCOUNTER — Ambulatory Visit: Payer: Self-pay | Admitting: Cardiology

## 2010-01-23 ENCOUNTER — Encounter: Payer: Self-pay | Admitting: Cardiology

## 2010-01-23 ENCOUNTER — Telehealth: Payer: Self-pay | Admitting: Cardiology

## 2010-01-23 LAB — CONVERTED CEMR LAB
Basophils Absolute: 0.1 10*3/uL (ref 0.0–0.1)
CO2: 26 meq/L (ref 19–32)
Chloride: 108 meq/L (ref 96–112)
Creatinine, Ser: 1.1 mg/dL (ref 0.4–1.5)
Eosinophils Absolute: 0.2 10*3/uL (ref 0.0–0.7)
Hemoglobin: 10.9 g/dL — ABNORMAL LOW (ref 13.0–17.0)
INR: 1.1 — ABNORMAL HIGH (ref 0.8–1.0)
Lymphocytes Relative: 31.4 % (ref 12.0–46.0)
Monocytes Relative: 2.9 % — ABNORMAL LOW (ref 3.0–12.0)
Neutro Abs: 4.6 10*3/uL (ref 1.4–7.7)
Neutrophils Relative %: 61.5 % (ref 43.0–77.0)
Potassium: 4.5 meq/L (ref 3.5–5.1)
Prothrombin Time: 11 s (ref 9.1–11.7)
RDW: 24.3 % — ABNORMAL HIGH (ref 11.5–14.6)
Sodium: 138 meq/L (ref 135–145)

## 2010-01-26 ENCOUNTER — Inpatient Hospital Stay (HOSPITAL_BASED_OUTPATIENT_CLINIC_OR_DEPARTMENT_OTHER): Admission: RE | Admit: 2010-01-26 | Discharge: 2010-01-26 | Payer: Self-pay | Admitting: Cardiology

## 2010-01-26 ENCOUNTER — Ambulatory Visit: Payer: Self-pay | Admitting: Cardiology

## 2010-01-30 ENCOUNTER — Ambulatory Visit: Payer: Self-pay | Admitting: Gastroenterology

## 2010-02-13 ENCOUNTER — Ambulatory Visit: Payer: Self-pay | Admitting: Gastroenterology

## 2010-02-13 LAB — CONVERTED CEMR LAB
Basophils Absolute: 0 10*3/uL (ref 0.0–0.1)
Eosinophils Absolute: 0.2 10*3/uL (ref 0.0–0.7)
Hemoglobin: 13.5 g/dL (ref 13.0–17.0)
Lymphocytes Relative: 26.7 % (ref 12.0–46.0)
MCHC: 32.2 g/dL (ref 30.0–36.0)
Monocytes Relative: 8.5 % (ref 3.0–12.0)
Neutro Abs: 3.6 10*3/uL (ref 1.4–7.7)
Neutrophils Relative %: 61.1 % (ref 43.0–77.0)
Platelets: 193 10*3/uL (ref 150.0–400.0)
RDW: 25.4 % — ABNORMAL HIGH (ref 11.5–14.6)

## 2010-02-16 ENCOUNTER — Encounter: Payer: Self-pay | Admitting: Cardiology

## 2010-02-17 ENCOUNTER — Ambulatory Visit: Payer: Self-pay | Admitting: Cardiology

## 2010-03-06 ENCOUNTER — Ambulatory Visit: Payer: Self-pay | Admitting: Internal Medicine

## 2010-03-06 LAB — CONVERTED CEMR LAB
CO2: 23 meq/L (ref 19–32)
Chloride: 104 meq/L (ref 96–112)
Creatinine, Ser: 1.03 mg/dL (ref 0.40–1.50)
Sodium: 139 meq/L (ref 135–145)

## 2010-03-12 ENCOUNTER — Ambulatory Visit: Payer: Self-pay | Admitting: Internal Medicine

## 2010-03-12 ENCOUNTER — Ambulatory Visit (HOSPITAL_COMMUNITY): Admission: RE | Admit: 2010-03-12 | Discharge: 2010-03-12 | Payer: Self-pay | Admitting: Cardiology

## 2010-03-12 ENCOUNTER — Encounter: Payer: Self-pay | Admitting: Cardiology

## 2010-03-13 ENCOUNTER — Telehealth: Payer: Self-pay | Admitting: Internal Medicine

## 2010-03-20 ENCOUNTER — Ambulatory Visit: Payer: Self-pay | Admitting: Internal Medicine

## 2010-03-24 ENCOUNTER — Encounter: Payer: Self-pay | Admitting: Internal Medicine

## 2010-04-09 ENCOUNTER — Encounter: Payer: Self-pay | Admitting: Cardiology

## 2010-04-10 ENCOUNTER — Ambulatory Visit: Payer: Self-pay | Admitting: Cardiology

## 2010-04-17 ENCOUNTER — Telehealth: Payer: Self-pay | Admitting: Gastroenterology

## 2010-04-23 ENCOUNTER — Ambulatory Visit: Payer: Self-pay | Admitting: Gastroenterology

## 2010-04-23 ENCOUNTER — Observation Stay (HOSPITAL_COMMUNITY): Admission: EM | Admit: 2010-04-23 | Discharge: 2010-04-24 | Payer: Self-pay | Admitting: Emergency Medicine

## 2010-04-23 ENCOUNTER — Ambulatory Visit: Payer: Self-pay | Admitting: Cardiology

## 2010-05-07 ENCOUNTER — Encounter: Payer: Self-pay | Admitting: Cardiology

## 2010-05-08 ENCOUNTER — Ambulatory Visit: Payer: Self-pay | Admitting: Cardiology

## 2010-05-11 ENCOUNTER — Ambulatory Visit: Payer: Self-pay | Admitting: Internal Medicine

## 2010-05-14 ENCOUNTER — Telehealth: Payer: Self-pay | Admitting: Internal Medicine

## 2010-05-20 ENCOUNTER — Telehealth: Payer: Self-pay | Admitting: Cardiology

## 2010-05-21 ENCOUNTER — Ambulatory Visit: Payer: Self-pay | Admitting: Cardiology

## 2010-05-22 ENCOUNTER — Ambulatory Visit: Payer: Self-pay | Admitting: Pulmonary Disease

## 2010-05-25 ENCOUNTER — Telehealth: Payer: Self-pay | Admitting: Cardiology

## 2010-05-26 ENCOUNTER — Telehealth (INDEPENDENT_AMBULATORY_CARE_PROVIDER_SITE_OTHER): Payer: Self-pay | Admitting: *Deleted

## 2010-06-10 ENCOUNTER — Telehealth: Payer: Self-pay | Admitting: Cardiology

## 2010-06-15 ENCOUNTER — Telehealth: Payer: Self-pay | Admitting: Internal Medicine

## 2010-06-16 ENCOUNTER — Encounter: Payer: Self-pay | Admitting: Internal Medicine

## 2010-06-16 LAB — HM DIABETES EYE EXAM: HM Diabetic Eye Exam: NORMAL

## 2010-06-17 ENCOUNTER — Telehealth (INDEPENDENT_AMBULATORY_CARE_PROVIDER_SITE_OTHER): Payer: Self-pay | Admitting: *Deleted

## 2010-06-22 ENCOUNTER — Telehealth: Payer: Self-pay | Admitting: Internal Medicine

## 2010-06-24 ENCOUNTER — Encounter: Payer: Self-pay | Admitting: Internal Medicine

## 2010-06-24 LAB — CONVERTED CEMR LAB
Albumin: 4.4 g/dL (ref 3.5–5.2)
Basophils Absolute: 0 10*3/uL (ref 0.0–0.1)
Basophils Relative: 0 % (ref 0–1)
CO2: 24 meq/L (ref 19–32)
Chloride: 107 meq/L (ref 96–112)
Creatinine, Urine: 149.1 mg/dL
HDL: 30 mg/dL — ABNORMAL LOW (ref >39)
Hgb A1c MFr Bld: 7.7 % — ABNORMAL HIGH (ref <5.7)
LDL Cholesterol: 40 mg/dL (ref 0–99)
Lymphocytes Relative: 22 % (ref 12–46)
MCHC: 33.7 g/dL (ref 30.0–36.0)
Microalb Creat Ratio: 6.5 mg/g (ref 0.0–30.0)
Microalb, Ur: 0.97 mg/dL (ref 0.00–1.89)
Monocytes Relative: 9 % (ref 3–12)
Neutro Abs: 4.5 10*3/uL (ref 1.7–7.7)
Neutrophils Relative %: 66 % (ref 43–77)
Potassium: 4.3 meq/L (ref 3.5–5.3)
RBC: 4.34 M/uL (ref 4.22–5.81)
RDW: 14.1 % (ref 11.5–15.5)
TSH: 8.183 microintl units/mL — ABNORMAL HIGH (ref 0.350–4.500)
Total Protein: 6.8 g/dL (ref 6.0–8.3)
Triglycerides: 90 mg/dL (ref <150)
VLDL: 18 mg/dL (ref 0–40)

## 2010-06-25 ENCOUNTER — Encounter: Payer: Self-pay | Admitting: Internal Medicine

## 2010-06-26 ENCOUNTER — Ambulatory Visit: Payer: Self-pay | Admitting: Internal Medicine

## 2010-06-26 DIAGNOSIS — E039 Hypothyroidism, unspecified: Secondary | ICD-10-CM | POA: Insufficient documentation

## 2010-07-01 ENCOUNTER — Telehealth (INDEPENDENT_AMBULATORY_CARE_PROVIDER_SITE_OTHER): Payer: Self-pay | Admitting: *Deleted

## 2010-07-09 ENCOUNTER — Telehealth (INDEPENDENT_AMBULATORY_CARE_PROVIDER_SITE_OTHER): Payer: Self-pay | Admitting: *Deleted

## 2010-07-18 ENCOUNTER — Encounter: Payer: Self-pay | Admitting: Pulmonary Disease

## 2010-07-21 ENCOUNTER — Encounter: Payer: Self-pay | Admitting: Cardiology

## 2010-07-22 ENCOUNTER — Telehealth: Payer: Self-pay | Admitting: Cardiology

## 2010-08-11 ENCOUNTER — Ambulatory Visit: Payer: Self-pay | Admitting: Cardiology

## 2010-08-11 LAB — CONVERTED CEMR LAB
Eosinophils Relative: 2.7 % (ref 0.0–5.0)
Lymphocytes Relative: 25.2 % (ref 12.0–46.0)
Monocytes Absolute: 0.5 10*3/uL (ref 0.1–1.0)
Monocytes Relative: 7.8 % (ref 3.0–12.0)
Neutrophils Relative %: 63.9 % (ref 43.0–77.0)
Platelets: 184 10*3/uL (ref 150.0–400.0)
WBC: 6.6 10*3/uL (ref 4.5–10.5)

## 2010-08-20 LAB — CONVERTED CEMR LAB
Chloride: 105 meq/L (ref 96–112)
Glucose, Bld: 183 mg/dL — ABNORMAL HIGH (ref 70–99)
Hgb A1c MFr Bld: 9.1 % — ABNORMAL HIGH (ref <5.7)
Potassium: 4.4 meq/L (ref 3.5–5.3)
Sodium: 141 meq/L (ref 135–145)

## 2010-08-28 ENCOUNTER — Ambulatory Visit: Payer: Self-pay | Admitting: Internal Medicine

## 2010-09-08 ENCOUNTER — Telehealth: Payer: Self-pay | Admitting: Internal Medicine

## 2010-09-17 ENCOUNTER — Telehealth: Payer: Self-pay | Admitting: Internal Medicine

## 2010-09-22 ENCOUNTER — Encounter: Payer: Self-pay | Admitting: Cardiology

## 2010-09-28 ENCOUNTER — Ambulatory Visit: Payer: Self-pay | Admitting: Internal Medicine

## 2010-10-08 ENCOUNTER — Ambulatory Visit: Payer: Self-pay | Admitting: Cardiology

## 2010-11-04 ENCOUNTER — Emergency Department (HOSPITAL_BASED_OUTPATIENT_CLINIC_OR_DEPARTMENT_OTHER): Admission: EM | Admit: 2010-11-04 | Discharge: 2010-11-04 | Payer: Self-pay | Admitting: Emergency Medicine

## 2010-11-04 ENCOUNTER — Ambulatory Visit: Payer: Self-pay | Admitting: Diagnostic Radiology

## 2010-11-06 ENCOUNTER — Ambulatory Visit: Payer: Self-pay | Admitting: Internal Medicine

## 2010-11-07 ENCOUNTER — Encounter: Payer: Self-pay | Admitting: Gastroenterology

## 2010-11-09 ENCOUNTER — Telehealth: Payer: Self-pay | Admitting: Internal Medicine

## 2010-11-11 ENCOUNTER — Telehealth (INDEPENDENT_AMBULATORY_CARE_PROVIDER_SITE_OTHER): Payer: Self-pay | Admitting: *Deleted

## 2010-11-25 ENCOUNTER — Encounter: Payer: Self-pay | Admitting: Internal Medicine

## 2010-11-25 ENCOUNTER — Ambulatory Visit: Payer: Self-pay | Admitting: Internal Medicine

## 2010-11-25 LAB — CONVERTED CEMR LAB
BUN: 16 mg/dL (ref 6–23)
Calcium: 9.8 mg/dL (ref 8.4–10.5)
Creatinine, Ser: 1.03 mg/dL (ref 0.40–1.50)
Creatinine, Urine: 158.3 mg/dL
Glucose, Bld: 152 mg/dL — ABNORMAL HIGH (ref 70–99)
HCT: 38.7 % — ABNORMAL LOW (ref 39.0–52.0)
Hemoglobin: 13.2 g/dL (ref 13.0–17.0)
Hgb A1c MFr Bld: 6.9 % — ABNORMAL HIGH (ref <5.7)
MCHC: 34.1 g/dL (ref 30.0–36.0)
MCV: 89.8 fL (ref 78.0–100.0)
Potassium: 4.5 meq/L (ref 3.5–5.3)
RBC: 4.31 M/uL (ref 4.22–5.81)
RDW: 13.1 % (ref 11.5–15.5)
TSH: 4.434 microintl units/mL (ref 0.350–4.500)

## 2010-11-27 ENCOUNTER — Encounter: Payer: Self-pay | Admitting: Internal Medicine

## 2010-11-27 ENCOUNTER — Telehealth: Payer: Self-pay | Admitting: Internal Medicine

## 2010-12-10 ENCOUNTER — Encounter: Payer: Self-pay | Admitting: Internal Medicine

## 2010-12-10 ENCOUNTER — Ambulatory Visit
Admission: RE | Admit: 2010-12-10 | Discharge: 2010-12-10 | Payer: Self-pay | Source: Home / Self Care | Attending: Internal Medicine | Admitting: Internal Medicine

## 2010-12-10 DIAGNOSIS — R5381 Other malaise: Secondary | ICD-10-CM | POA: Insufficient documentation

## 2010-12-10 DIAGNOSIS — R5383 Other fatigue: Secondary | ICD-10-CM | POA: Insufficient documentation

## 2010-12-10 LAB — CONVERTED CEMR LAB
Glucose, Urine, Semiquant: 250
Nitrite: NEGATIVE
Protein, U semiquant: 30
Specific Gravity, Urine: >=1.03
pH: 5

## 2010-12-18 ENCOUNTER — Observation Stay (HOSPITAL_COMMUNITY)
Admission: EM | Admit: 2010-12-18 | Discharge: 2010-12-19 | Payer: Self-pay | Source: Home / Self Care | Attending: Cardiology | Admitting: Cardiology

## 2010-12-21 ENCOUNTER — Ambulatory Visit: Admit: 2010-12-21 | Payer: Self-pay | Admitting: Internal Medicine

## 2010-12-21 LAB — HEPATIC FUNCTION PANEL
ALT: 21 U/L (ref 0–53)
AST: 17 U/L (ref 0–37)
Albumin: 3.4 g/dL — ABNORMAL LOW (ref 3.5–5.2)
Alkaline Phosphatase: 60 U/L (ref 39–117)
Bilirubin, Direct: 0.1 mg/dL (ref 0.0–0.3)
Indirect Bilirubin: 0.3 mg/dL (ref 0.3–0.9)
Total Bilirubin: 0.4 mg/dL (ref 0.3–1.2)
Total Protein: 5.6 g/dL — ABNORMAL LOW (ref 6.0–8.3)

## 2010-12-21 LAB — CK TOTAL AND CKMB (NOT AT ARMC)
CK, MB: 0.4 ng/mL (ref 0.3–4.0)
Relative Index: INVALID (ref 0.0–2.5)
Total CK: 31 U/L (ref 7–232)

## 2010-12-21 LAB — BASIC METABOLIC PANEL
BUN: 17 mg/dL (ref 6–23)
CO2: 20 mEq/L (ref 19–32)
Calcium: 8.2 mg/dL — ABNORMAL LOW (ref 8.4–10.5)
Chloride: 106 mEq/L (ref 96–112)
Creatinine, Ser: 0.93 mg/dL (ref 0.4–1.5)
GFR calc Af Amer: >60 mL/min (ref >60)
GFR calc non Af Amer: >60 mL/min (ref >60)
Glucose, Bld: 231 mg/dL — ABNORMAL HIGH (ref 70–99)
Potassium: 3.8 mEq/L (ref 3.5–5.1)
Sodium: 136 mEq/L (ref 135–145)

## 2010-12-21 LAB — LIPID PANEL
Cholesterol: 93 mg/dL (ref 0–200)
HDL: 27 mg/dL — ABNORMAL LOW (ref >39)
LDL Cholesterol: 44 mg/dL (ref 0–99)
Total CHOL/HDL Ratio: 3.4 RATIO
Triglycerides: 109 mg/dL (ref <150)
VLDL: 22 mg/dL (ref 0–40)

## 2010-12-21 LAB — POCT CARDIAC MARKERS
CKMB, poc: <1 ng/mL — ABNORMAL LOW (ref 1.0–8.0)
CKMB, poc: <1 ng/mL — ABNORMAL LOW (ref 1.0–8.0)
Myoglobin, poc: 34.3 ng/mL (ref 12–200)
Myoglobin, poc: 47.5 ng/mL (ref 12–200)
Troponin i, poc: <0.05 ng/mL (ref 0.00–0.09)
Troponin i, poc: <0.05 ng/mL (ref 0.00–0.09)

## 2010-12-21 LAB — CBC
HCT: 36 % — ABNORMAL LOW (ref 39.0–52.0)
Hemoglobin: 12.1 g/dL — ABNORMAL LOW (ref 13.0–17.0)
MCH: 29.4 pg (ref 26.0–34.0)
MCHC: 33.6 g/dL (ref 30.0–36.0)
MCV: 87.4 fL (ref 78.0–100.0)
Platelets: 174 10*3/uL (ref 150–400)
RBC: 4.12 MIL/uL — ABNORMAL LOW (ref 4.22–5.81)
RDW: 12.8 % (ref 11.5–15.5)
WBC: 10.7 10*3/uL — ABNORMAL HIGH (ref 4.0–10.5)

## 2010-12-21 LAB — PROTIME-INR
INR: 1.05 (ref 0.00–1.49)
Prothrombin Time: 13.9 seconds (ref 11.6–15.2)

## 2010-12-21 LAB — HEMOGLOBIN A1C
Hgb A1c MFr Bld: 7.7 % — ABNORMAL HIGH (ref <5.7)
Mean Plasma Glucose: 174 mg/dL — ABNORMAL HIGH (ref <117)

## 2010-12-21 LAB — CARDIAC PANEL(CRET KIN+CKTOT+MB+TROPI)
CK, MB: 0.3 ng/mL (ref 0.3–4.0)
Relative Index: INVALID (ref 0.0–2.5)
Total CK: 30 U/L (ref 7–232)
Troponin I: 0.01 ng/mL (ref 0.00–0.06)

## 2010-12-21 LAB — GLUCOSE, CAPILLARY: Glucose-Capillary: 221 mg/dL — ABNORMAL HIGH (ref 70–99)

## 2010-12-21 LAB — TROPONIN I: Troponin I: <0.01 ng/mL (ref 0.00–0.06)

## 2010-12-27 ENCOUNTER — Encounter: Payer: Self-pay | Admitting: Internal Medicine

## 2010-12-28 ENCOUNTER — Encounter: Payer: Self-pay | Admitting: Internal Medicine

## 2010-12-28 NOTE — Discharge Summary (Signed)
NAMEFARMER, KUNDINGER             ACCOUNT NO.:  [id number]  MEDICAL RECORD NO.:  [id number]          PATIENT TYPE:  INP  LOCATION:  2030                         FACILITY:  [facility]  PHYSICIAN:  Wallis Bamberg. Johnsie Cancel, MD, FACCDATE OF BIRTH:  1946-11-27  DATE OF ADMISSION:  12/18/2010 DATE OF DISCHARGE:  12/19/2010                              DISCHARGE SUMMARY   DISCHARGE DIAGNOSES: 1. Chest pain, felt noncardiac with no other immediate cause.     Negative for myocardial infarction this admission. 2. Coronary artery disease, status post coronary artery bypass graft     in October 2010.     a.     Patent grafts by catheterization, February 2011. 3. Hypertension. 4. Hyperlipidemia. 5. Depression. 6. Right bundle-branch block. 7. Headache. 8. Anemia. 9. Orthostatic hypotension. 10.Restless legs syndrome. 11.Nephrolithiasis. 12.Cervical spondylosis.  SECONDARY DIAGNOSIS:  Diabetes mellitus.  HOSPITAL COURSE:  Mr. Maunu is a 65 year old a gentleman with a prior history of CAD, status post CABG; hypertension; hyperlipidemia; diabetes who presented to [facility] with complaints of left shoulder blade pain radiating to his left arm with associated cramps in his chest.  His shoulder pain improved slightly with sublingual nitroglycerin.  EKG showed no acute changes.  Initial cardiac enzymes were negative.  He was going be started on IV heparin, but apparently had anemia, requiring blood transfusion several months ago, so that was put on hold.  He was kept n.p.o. over midnight in case intervention was needed.  His EKG remained stable and his cardiac enzymes were negative x4.  Chest x-ray was stable.  There was no obvious immediate cause for his chest pain.  The patient was pain free today.  Dr. Johnsie Cancel has seen and examined him today and feels he is stable for discharge.  DISCHARGE LABS:  WBC 10.1, hemoglobin 12.1, hematocrit 36, platelet count 174.  Sodium 136, potassium 2.8,  chloride 106, CO2 20, glucose 230, BUN 17, creatinine 0.93.  LFTs were within normal limits with the exception of decreased total protein 5.6 and albumin at 3.4.  Hemoglobin A1c is 7.7.  Cardiac enzymes negative x4.  Total cholesterol 93, triglycerides 109, HDL 27, LDL 44.  STUDIES:  Chest x-ray on December 18, 2010, showed no acute findings, prior CABG.  DISCHARGE MEDICATIONS: 1. Levothroid 50 mcg daily. 2. Carafate one tablespoon daily. 3. Nebivolol 5 mg daily. 4. NovoLog Pen 1-7 units daily p.r.n. 5. Voltaren gel 2 g q.i.d. p.r.n. 6. Imdur 6 mg daily. 7. QVAR 40 mcg two puffs inhaled b.i.d. 8. Fish oil 1000 mg two capsule b.i.d. 9. Plavix 75 mg daily. 10.Ferrous sulfate 325 mg two time daily. 11.Aspirin 81 mg daily. 12.Triamcinolone ointment apply to hands one outpatient b.i.d. p.r.n. 13.Protonix 40 mg daily. 14.Pramipexole 0.25 mg nightly. 15.Sertraline 100 mg b.i.d. 16.Gabapentin 300 mg b.i.d. 17.Simvastatin 40 mg nightly. 18.Metformin 1000 mg b.i.d. 19.Glimepiride 2 mg daily. 20.Nitroglycerin 0.4 mg sublingual every 5 minutes as needed up to     three doses for chest pain. 21.Lantus Solostar Pen 26-35 units daily at bedtime per sliding scale.  DISPOSITION:  The patient will be discharged in stable condition to home.  He is  instructed to increase activity slowly and follow a low- sodium, heart-healthy diabetic diet.  If he notices any recurrence of chest pain, shortness of breath, dizziness, sweating or excessive weakness, he is to call or return.  He will follow up with Dr. [name] approximately 2 weeks and our office will call him with this appointment.  DURATION OF DISCHARGE ENCOUNTER:  Greater than 30 minutes including physician and PA time.     Dayna Dunn, P.A.C.   ______________________________ Wallis Bamberg Johnsie Cancel, MD, [facility]    DD/MEDQ  D:  12/19/2010  T:  12/20/2010  Job:  [id number]  cc:   Carlena Bjornstad, MD, [facility] Sandy Salaam. Shawna Orleans, DO Sandy Salaam. Deatra Ina,  MD,FACG  Electronically Signed by Jenkins Rouge MD [facility] on 12/22/2010 01:00:50 PM Electronically Signed by Melina Copa  on 12/28/2010 10:32:44 AM

## 2011-01-05 ENCOUNTER — Ambulatory Visit
Admission: RE | Admit: 2011-01-05 | Discharge: 2011-01-05 | Payer: Self-pay | Source: Home / Self Care | Attending: Cardiology | Admitting: Cardiology

## 2011-01-07 ENCOUNTER — Encounter: Payer: Self-pay | Admitting: Internal Medicine

## 2011-01-07 NOTE — Progress Notes (Signed)
Summary: flexaril not working   Phone Note Call from Patient Call back at [employer] [phone]   Caller: Patient Call For: [name] [name]  Summary of Call: the flexaril is not helping with the back pain.  He cannot rest.  Is there something different he could try to releave the pain and allow him to sleep some at night.  Initial call taken by: Shanon Payor,  November 09, 2010 11:29 AM  Follow-up for Phone Call        take 325 mg of tylenol with tramadol Follow-up by: D. Drema Pry DO,  November 09, 2010 5:13 PM  Additional Follow-up for Phone Call Additional follow up Details #1::        call returned to patient at [phone], no answer, a detailed voice message was left informing patient per Dr Shawna Orleans instructions. Message was left for patient to call if any questions Additional Follow-up by: Jiles Garter CMA,  November 10, 2010 8:04 AM    New/Updated Medications: TRAMADOL HCL 50 MG TABS (TRAMADOL HCL) one by mouth two times a day as needed Prescriptions: TRAMADOL HCL 50 MG TABS (TRAMADOL HCL) one by mouth two times a day as needed  #30 x 0   Entered and Authorized by:   D. Drema Pry DO   Signed by:   D. Drema Pry DO on 11/09/2010   Method used:   Electronically to        [facility] (retail)       [address]       [place], [place]  [address]       Ph: [phone] or [phone]       Fax: [phone]   RxID:   [id number]

## 2011-01-07 NOTE — Letter (Signed)
Summary: Cardiac Catheterization Instructions- [place], [place]  [id number] [place]   [place], [place] [address]   Phone: [phone]  Fax: [phone]     01/23/2010 MRN: [id number]  [facility] [address] [place], [place]  [address]  Dear Mr. Nobile,  Mo You are scheduled for a Cardiac Catheterization on Monday 2/21 with  Dr. Lia Foyer  Please arrive to the 1st floor of the Heart and Vascular Center at [facility] at 6:30 am / pm on the day of your procedure. Please do not arrive before 6:30 a.m. Call the Heart and Vascular Center at [phone] if you are unable to make your appointmnet. The Code to get into the parking garage under the building is 0900. Take the elevators to the 1st floor. You must have someone to drive you home. Someone must be with you for the first 24 hours after you arrive home. Please wear clothes that are easy to get on and off and wear slip-on shoes. Do not eat or drink after midnight except water with your medications that morning. Bring all your medications and current insurance cards with you.  _X__ DO NOT take these medications before your procedure:   Hold Metformin Mon, Tue and Wed.   Hold Glimepiride, Lantus, and Novolog Mon AM   Sun PM take 1/2 of Lantus dose. ___ Make sure you take your aspirin.  ___ You may take ALL of your medications with water that morning. ________________________________________________________________________________________________________________________________  ___ DO NOT take ANY medications before your procedure.  ___ Pre-med instructions:  ________________________________________________________________________________________________________________________________  The usual length of stay after your procedure is 2 to 3 hours. This can vary.  If you have any questions, please call the office at the number listed above.   Kevan Rosebush, RN  Appended  Document: Cardiac Catheterization Instructions- [facility] Lab instructions reviewed w/pts wife over phone

## 2011-01-07 NOTE — Progress Notes (Signed)
Summary: Lantus Refill  Phone Note Refill Request Message from:  Fax from Pharmacy on September 17, 2010 10:48 AM  Refills Requested: Medication #1:  LANTUS SOLOSTAR 100 UNIT/ML SOLN Inject 28 - 35 units daily   Dosage confirmed as above?Dosage Confirmed   Brand Name Necessary? No   Supply Requested: 1 month   Last Refilled: 08/14/2010  Method Requested: Electronic Next Appointment Scheduled: 10-24- Dr Shawna Orleans  Initial call taken by: Shanon Payor,  September 17, 2010 10:49 AM  Follow-up for Phone Call        Rx completed in Dr. Brantley Stage Follow-up by: Jiles Garter CMA,  September 17, 2010 11:53 AM    Prescriptions: LANTUS SOLOSTAR 100 UNIT/ML SOLN (INSULIN GLARGINE) Inject 28 - 35 units daily  #15 x 3   Entered by:   Jiles Garter CMA   Authorized by:   D. Drema Pry DO   Signed by:   Jiles Garter CMA on 09/17/2010   Method used:   Electronically to        [facility] [phone]* (retail)       [address]       [place], [place]  [address]       Ph: [phone] or [phone]       Fax: [phone]   RxID:   [id number]

## 2011-01-07 NOTE — Assessment & Plan Note (Signed)
Summary: Follow up pill cam/dfs   History of Present Illness Visit Type: Follow-up Visit Primary GI MD: Verl Blalock MD FACP [place] Primary Provider: [name] DO Requesting Provider: n/a Chief Complaint: follow up after capsule, pt states he is having some fatigue and dizziness when bending over History of Present Illness:   65 year old Caucasian male with coronary artery disease, recent stenting, and chronically on Plavix and aspirin. He's had recurrent bleeding from erosive gastric duodenal disease despite taking AcipHex 20 mg a day. He recent was hospitalized and had a 2 unit transfusion. Repeat endoscopy at that time by Dr. Deatra Ina again showed erosive mucosal disease. Small bowel camera exam confirmed erosions in the stomach but otherwise was negative. He has had negative colonoscopy within the last year.  Patient did have a melanotic stool before his hospitalization. He denies any GI symptomatology. His aspirin is currently been decreased to 81 mg a day, and his Plavix is on hold. He takes AcipHex b.i.d. and denies over-the-counter NSAID use or other gastric irritants. He currently is asymptomatic in terms of any cardiovascular, pulmonary, or general medical issues. He has multiple medical problems and is on multiple medications.   GI Review of Systems      Denies abdominal pain, acid reflux, belching, bloating, chest pain, dysphagia with liquids, dysphagia with solids, heartburn, loss of appetite, nausea, vomiting, vomiting blood, weight loss, and  weight gain.        Denies anal fissure, black tarry stools, change in bowel habit, constipation, diarrhea, diverticulosis, fecal incontinence, heme positive stool, hemorrhoids, irritable bowel syndrome, jaundice, light color stool, liver problems, rectal bleeding, and  rectal pain.    Current Medications (verified): 1)  Glimepiride 2 Mg Tabs (Glimepiride) .... One By Mouth Once Daily 2)  Lantus Solostar 100 Unit/ml Soln (Insulin  Glargine) .... Inject 28 - 35 Units Daily 3)  Metformin Hcl 1000 Mg  Tabs (Metformin Hcl) .... Take 1 Tablet By Mouth Two Times A Day 4)  Simvastatin 40 Mg  Tabs (Simvastatin) .... Take 1 Tablet By Mouth At Bedtime 5)  Gabapentin 300 Mg  Caps (Gabapentin) .Marland Kitchen.. 1 By Mouth Two Times A Day 6)  Diovan 40 Mg  Tabs (Valsartan) .... Take 1 Tablet By Mouth Once A Day 7)  Sertraline Hcl 100 Mg  Tabs (Sertraline Hcl) .... Take 1 Tablet By Mouth Two Times A Day 8)  Imdur 60 Mg  Tb24 (Isosorbide Mononitrate) .Marland Kitchen.. 1 Tab Daily 9)  Carvedilol 12.5 Mg Tabs (Carvedilol) .... Take One Tablet By Mouth Twice A Day 10)  Fish Oil 1000 Mg Caps (Omega-3 Fatty Acids) .... Take Two Gel Caps Two Times A Day 11)  Nitroglycerin 0.4 Mg Subl (Nitroglycerin) .... Disolve Under Tounge As Needed 12)  Voltaren 1 % Gel (Diclofenac Sodium) .... Apply 2 Grams Qid 13)  Pramipexole Dihydrochloride 0.25 Mg Tabs (Pramipexole Dihydrochloride) .... One By Mouth Qhs 14)  Vicodin 5-500 Mg Tabs (Hydrocodone-Acetaminophen) .... Take 1 Tablet By Mouth Two Times A Day As Needed 15)  Aspirin 325 Mg  Tabs (Aspirin) .Marland Kitchen.. 1 By Mouth Daily 16)  Novolog Penfill 100 Unit/ml Soln (Insulin Aspart) .... Inject 1-7 Units Subcutaneously Sliding Scale As Needed 17)  Metaxalone 800 Mg Tabs (Metaxalone) .... One By Mouth At Bedtime As Needed 18)  Triamcinolone Acetonide 0.1 % Oint (Triamcinolone Acetonide) .... Apply Two Times A Day To Dry Hands 19)  Ferrous Sulfate 325 (65 Fe) Mg  Tabs (Ferrous Sulfate) .... Two Times A Day 20)  Aciphex 20 Mg  Tbec (Rabeprazole Sodium) .... Take 1 Tablet By Mouth Two Times A Day 21)  Novofine 30g X 8 Mm Misc (Insulin Pen Needle) .... Use For Insulin Injection Three Times A Day  Allergies (verified): 1)  ! Morphine  Past History:  Past medical, surgical, family and social histories (including risk factors) reviewed for relevance to current acute and chronic problems.  Past Medical History: Reviewed history from  01/22/2010 and no changes required. Diabetes Mellitus Type II - uncontrolled. CAD.....Marland Kitchenstatus post coronary artery bypass graft in 2000, status post cardiac catheterization in 2006,     2009..continued chest pain and shortness of breath despite oral medication adjustments including ranexa.     Catheter November 2009/...DES Stent..11/26/2008 to distal RCA leading to acute marginal. CABG..2000 Obstructive sleep apnea.  I do not know if it is possible if this is playing any role in his overall general fatigue. Depression.  He is on sertraline for this.   Hyperlipidemia and he is tolerating simvastatin.  Good left ventricular function.  Palpitations.  He had sinus rhythm when he wore his  event recorder.  Renal stones. Rrestless legs. ..possible RBBB Depression / Bipolar  Median sternotomy suture... one is broken... noted on chest x-ray November, 2010... no clinical significance Dizziness Edema Hypertension SOB Spondylosis.Marland KitchenMarland KitchenC5-6,C6-7  MRI...10/15/2009 MRI  Head....chronic microvascular ischemia....10/2009 Anemia..  hemoglobin 7.4.Marland KitchenMarland KitchenMarland KitchenMarland Kitchen iron deficiency... January, 2011.... endoscopy normal....2 unit transfusion in hospital  /  capsule endoscopy..February, 2011.... no small bowel abnormalities.... most likely source of bleeding was the gastric erosions..... meds adjusted.... patient to see Dr.Riya Huxford soon    Past Surgical History: Reviewed history from 12/19/2009 and no changes required. Nasal Septum repair Coronary artery bypass graft 2000  percutaneous stenting of the distal right  coronary artery 11/2008         Family History: Reviewed history from 12/19/2009 and no changes required. Mother deceased at age 32, has history of heart disease, died during coronary artery bypass graft.  Father deceased at age 40 from unknown arrhythmia.  The patient has 4 brothers, all have diabetes, 1 has a history of cerebrovascular accident and 1 has prostate cancer.    One of his brothers recently  diagnosed to pancreatic cancer. Brother with recent diagnosis of CAD and CABG sheduled. Nephew with cardiac arrest  Family History of Prostate Cancer:brother          Social History: Reviewed history from 12/19/2009 and no changes required. The patient is married, has 4 daughters, 1 daughter lives with the patient.  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    Never Smoked   Alcohol use-no   Illicit Drug Use - no    Daily Caffeine Use coffee in the AM    Review of Systems  The patient denies allergy/sinus, anemia, anxiety-new, arthritis/joint pain, back pain, blood in urine, breast changes/lumps, confusion, cough, coughing up blood, depression-new, fainting, fatigue, fever, headaches-new, hearing problems, heart murmur, heart rhythm changes, itching, muscle pains/cramps, night sweats, nosebleeds, shortness of breath, skin rash, sleeping problems, sore throat, swelling of feet/legs, swollen lymph glands, thirst - excessive, urination - excessive, urination changes/pain, urine leakage, vision changes, and voice change.    Vital Signs:  Patient profile:   65 year old male Height:      69 inches Weight:      189 pounds BMI:     28.01 Pulse rate:   60 / minute Pulse rhythm:   regular BP sitting:   132 / 70  (left arm) Cuff size:  regular  Vitals Entered By: Abelino Derrick CMA Deborra Medina) (January 30, 2010 10:35 AM)  Physical Exam  General:  Well developed, well nourished, no acute distress.healthy appearing.   Head:  Normocephalic and atraumatic. Eyes:  PERRLA, no icterus.exam deferred to patient's ophthalmologist.   Psych:  Alert and cooperative. Normal mood and affect.   Impression & Recommendations:  Problem # 1:  ANEMIA (N067566.9) Assessment Improved Continue AcipHex 20 mg twice a day we'll add Carafate suspension 1 tablespoon after meals and at bedtime. Repeat CBC in 2 weeks, continue oral iron therapy, agree with lower dose of aspirin, and have okayed  reinstitution of Plavix with close cardiac, GI, and laboratory followup. I will try to review all of his records to be sure that he had been evaluated for H. pylori. We may need to repeat stool H. pylori antigen. I am not sure this was done recently at the time of endoscopy per Dr. Deatra Ina. This certainly is a consideration.  Problem # 2:  PRE-OPERATIVE CARDIOVASCULAR EXAMINATION (ICD-V72.81) Assessment: Improved Continue all medications per Dr. [name]. He currently is on aspirin 81 mg a day. We will restart his Plavix therapy as recommended by cardiology.  Patient Instructions: 1)  Copy sent to : Dr. Dola Argyle and Dr. Drema Pry 2)  Please continue current medications. 3)  repeat CBC in 2 weeks with GI followup in 6 weeks 4)  Add Carafate suspension 1 tablespoon after meals and at bedtime 5)  Avoid all NSAIDs and other over-the-counter meds not approved by physicians  6)  The medication list was reviewed and reconciled.  All changed / newly prescribed medications were explained.  A complete medication list was provided to the patient / caregiver.  Appended Document: Follow up pill cam/dfs    Clinical Lists Changes  Medications: Added new medication of CARAFATE 1 GM/10ML  SUSP (SUCRALFATE) One tbsp qid - Signed Rx of CARAFATE 1 GM/10ML  SUSP (SUCRALFATE) One tbsp qid;  #14 oz x 3;  Signed;  Entered by: Alberteen Spindle RN;  Authorized by: Sable Feil MD [facility];  Method used: Electronically to [facility] [phone]*, [address], [place], [place]  [address], Ph: [phone] or [phone], Fax: [phone]    Prescriptions: CARAFATE 1 GM/10ML  SUSP (SUCRALFATE) One tbsp qid  #14 oz x 3   Entered by:   Alberteen Spindle RN   Authorized by:   Sable Feil MD [facility]   Signed by:   Alberteen Spindle RN on 01/30/2010   Method used:   Electronically to        [facility] [phone]* (retail)       [address]       [place], [place]  [address]       Ph: [phone] or [phone]       Fax:  [phone]   RxID:   [id number]

## 2011-01-07 NOTE — Procedures (Signed)
Summary: Upper Endoscopy  Patient: [name] [name] Note: All result statuses are Final unless otherwise noted.  Tests: (1) Upper Endoscopy (EGD)   EGD Upper Endoscopy       DONE (C)     [facility]     [place], [place]  [address]           ENDOSCOPY PROCEDURE REPORT           PATIENT:  [name] [name], [name] [name]  MR#:  [id number]     BIRTHDATE:  1946/11/01, 63 yrs. old  GENDER:  male           ENDOSCOPIST:  [name] Salaam. Deatra Ina, MD     Referred by:           PROCEDURE DATE:  12/26/2009     PROCEDURE:  EGD, diagnostic     ASA CLASS:  Class II     INDICATIONS:  iron deficiency anemia           MEDICATIONS:   Fentanyl 50 mcg IV, Versed 5 mg IV     TOPICAL ANESTHETIC:  Cetacaine Spray           DESCRIPTION OF PROCEDURE:   After the risks benefits and     alternatives of the procedure were thoroughly explained, informed     consent was obtained.  The [id number] endoscope was     introduced through the mouth and advanced to the third portion of     the duodenum, without limitations.  The instrument was slowly     withdrawn as the mucosa was fully examined.     <<PROCEDUREIMAGES>>           The upper, middle, and distal third of the esophagus were     carefully inspected and no abnormalities were noted. The z-line     was well seen at the GEJ. The endoscope was pushed into the fundus     which was normal including a retroflexed view. The antrum,gastric     body, first and second part of the duodenum were unremarkable (see     image001, image002, image003, image004, image005, image006, and     image007).  Multiple erosions were found in the antrum (see     image004). Multiple superficial nonbleeding erosions in antrum     Otherwise the examination was normal (see image001, image002,     image005, image006, and image007).    Retroflexed views revealed     no abnormalities.    The scope was then withdrawn from the patient     and the procedure  completed.           COMPLICATIONS:  None           ENDOSCOPIC IMPRESSION:     1)Mild erosive gastritis           Findings do not explain h/o GI bleeding and anemia           RECOMMENDATIONS:     1) capsule endoscopy           REPEAT EXAM:  No           ______________________________     [name] Salaam. Deatra Ina, MD           CC:  [name] Bjornstad, MD           n.     REVISED:  12/26/2009 12:46 PM     eSIGNED:   Sandy Salaam. Deatra [name]  at 12/26/2009 12:46 PM           [name] [name], [place], [id number]  Note: An exclamation mark (!) indicates a result that was not dispersed into the flowsheet. Document Creation Date: 12/26/2009 12:46 PM _______________________________________________________________________  (1) Order result status: Final Collection or observation date-time: 12/26/2009 12:30 Requested date-time:  Receipt date-time:  Reported date-time:  Referring Physician:   Ordering Physician: [name] [name] [phone]) Specimen Source:  Source: [name] [name] Order Number: [id number] Lab site:   Appended Document: Upper Endoscopy Alberteen Spindle RN to schedule pt. Capsule Endoscopy.

## 2011-01-07 NOTE — Assessment & Plan Note (Signed)
Summary: consult for osa   Copy to:  [name] Primary Provider/Referring Provider:  [name] DO  CC:  Sleep Consult.  History of Present Illness: The pt is a 65y/o male who I have been asked to see for osa.  He was diagnosed in 2001 with moderate osa with AHI of 21/hr, and ultimatel titrated to optimal cpap pressure of 8cm.  He was started on cpap, but could never get used to the mask, and discontinued.  He underwent NSR/UP3, and states that his snoring and sleep quality improved after the surgery.  The past 43mos he has noted that his snoring is getting worse, and now is having snoring/choking arousals.  No one has mentioned pauses in his breathing during sleep.  He goes to bed btw 10-11pm, and arises at 5am to start his day.  He is not rested upon arising.  He notes definite sleep pressure during the day, and falls asleep anytime he sits down.  He does have some sleepiness with driving.  His weight is neutral since his last sleep study, and his epworth score today is 22.  Current Medications (verified): 1)  Glimepiride 2 Mg Tabs (Glimepiride) .... One By Mouth Once Daily 2)  Lantus Solostar 100 Unit/ml Soln (Insulin Glargine) .... Inject 28 - 35 Units Daily 3)  Metformin Hcl 1000 Mg  Tabs (Metformin Hcl) .... Take 1 Tablet By Mouth Two Times A Day 4)  Simvastatin 40 Mg  Tabs (Simvastatin) .... Take 1 Tablet By Mouth At Bedtime 5)  Gabapentin 300 Mg  Caps (Gabapentin) .Marland Kitchen.. 1 By Mouth Two Times A Day 6)  Sertraline Hcl 100 Mg  Tabs (Sertraline Hcl) .... Take 1 Tablet By Mouth Two Times A Day 7)  Imdur 60 Mg  Tb24 (Isosorbide Mononitrate) .... 1/2 Tablet By Mouth Once Daily 8)  Fish Oil 1000 Mg Caps (Omega-3 Fatty Acids) .... Take Two Gel Caps Two Times A Day 9)  Nitroglycerin 0.4 Mg Subl (Nitroglycerin) .... Disolve Under Tounge As Needed 10)  Voltaren 1 % Gel (Diclofenac Sodium) .... Apply 2 Grams Qid As Needed 11)  Pramipexole Dihydrochloride 0.25 Mg Tabs (Pramipexole Dihydrochloride) ....  One By Mouth Qhs 12)  Aspirin 81 Mg Tbec (Aspirin) .... Take One Tablet By Mouth Daily 13)  Novolog Penfill 100 Unit/ml Soln (Insulin Aspart) .... Inject 1-7 Units Subcutaneously Sliding Scale As Needed 14)  Triamcinolone Acetonide 0.1 % Oint (Triamcinolone Acetonide) .... Apply Two Times A Day To Dry Hands 15)  Ferrous Sulfate 325 (65 Fe) Mg  Tabs (Ferrous Sulfate) .... Two Times A Day 16)  Pantoprazole Sodium 40 Mg Tbec (Pantoprazole Sodium) .Marland Kitchen.. 1 By Mouth Once Daily 17)  Novofine 30g X 8 Mm Misc (Insulin Pen Needle) .... Use For Insulin Injection Three Times A Day 18)  Carafate 1 Gm/75ml  Susp (Sucralfate) .... One Tbsp Qid 19)  Plavix 75 Mg Tabs (Clopidogrel Bisulfate) .... Take 1 Tablet By Mouth Once A Day 20)  Bystolic 10 Mg Tabs (Nebivolol Hcl) .... Take 1  Tab Daily As Directed  Allergies: 1)  ! Morphine  Past History:  Past Medical History: Diabetes Mellitus Type II - uncontrolled. CAD.....Marland Kitchenstatus post coronary artery bypass graft in 2000, status post cardiac catheterization in 2006,     2009..continued chest pain and shortness of breath despite oral medication adjustments including ranexa.     Catheter November 2009/...DES Stent..11/26/2008 to distal RCA leading to acute marginal.  /  cath..01/26/2010..continued patentcy of grafts and stent to RCA.Marland KitchenMarland KitchenModest progression of distal disease...medical  Rx  /  [facility]  Apr 23, 2010... no MI... home.. Diovan stopped and into her dose reduced for relative low blood pressure CABG..2000 Obstructive sleep apnea.  I do not know if it is possible if this is playing any role in his overall general fatigue. Depression.  He is on sertraline for this.   Hyperlipidemia and he is tolerating simvastatin.   Good left ventricular function.  Palpitations.  He had sinus rhythm when he wore his  event recorder. Renal stones. Rrestless legs. ..on dopamine agonist. RBBB Depression / Bipolar  Median sternotomy suture... one is broken... noted on chest x-ray  November, 2010... no clinical significance Dizziness Edema Hypertension SOB....cardiopulmonary exercise test... March 12, 2010... mild functional limitation.... no clear pulmonary or cardiac limitation.... possible deconditioning and mild chronotropic incompetence...( peak heart rate 130) Spondylosis.Marland KitchenMarland KitchenC5-6,C6-7  MRI...10/15/2009 MRI  Head....chronic microvascular ischemia....10/2009 Anemia..  hemoglobin 7.4.Marland KitchenMarland KitchenMarland KitchenMarland Kitchen iron deficiency... January, 2011.... endoscopy normal....2 unit transfusion in hospital  /  capsule endoscopy..February, 2011.... no small bowel abnormalities.... most likely source of bleeding was the gastric erosions..... meds adjusted.... patient to see Dr.Patterson soon Recommendation for disability   ... May 21, 2010     Past Surgical History: Nasal Septum repair- UP3 Coronary artery bypass graft 2000  percutaneous stenting of the distal right  coronary artery 11/2008           Family History: Reviewed history from 03/20/2010 and no changes required. Mother deceased at age 40, has history of heart disease, died during coronary artery bypass graft.  Father deceased at age 28 from unknown arrhythmia.  The patient has 4 brothers, all have diabetes, 1 has a history of cerebrovascular accident and 1 has prostate cancer.    One of his brothers recently diagnosed to pancreatic cancer. Brother with recent diagnosis of CAD and CABG sheduled. Nephew with cardiac arrest  Family History of Prostate Cancer:brother            Social History: Reviewed history from 03/20/2010 and no changes required. The patient is married, has 4 daughters, 1 daughter lives with the patient.  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    Never Smoked   Alcohol use-no    Illicit Drug Use - no     Daily Caffeine Use coffee in the AM    Review of Systems       The patient complains of shortness of breath with activity, shortness of breath at rest, chest pain, irregular heartbeats,  acid heartburn, tooth/dental problems, headaches, anxiety, depression, hand/feet swelling, and joint stiffness or pain.  The patient denies productive cough, non-productive cough, coughing up blood, indigestion, loss of appetite, weight change, abdominal pain, difficulty swallowing, sore throat, nasal congestion/difficulty breathing through nose, sneezing, itching, ear ache, rash, change in color of mucus, and fever.    Vital Signs:  Patient profile:   65 year old male Height:      69 inches Weight:      190 pounds BMI:     28.16 O2 Sat:      100 % on Room air Temp:     97.7 degrees F oral Pulse rate:   67 / minute BP sitting:   108 / 70  (left arm)  Vitals Entered By: Matthew Folks LPN (June 17, 624THL 075-GRM PM)  O2 Flow:  Room air CC: Sleep Consult Comments Medications reviewed with patient Matthew Folks LPN  June 17, 624THL [id number] PM    Physical Exam  General:  wd male in nad  Eyes:  PERRLA and EOMI.   Nose:  patent without discharge Mouth:  trimmed palate and uvula, no soft tissue redundancy Neck:  no jvd, tmg, LN Lungs:  clear to auscultation Heart:  rrr, no mrg Abdomen:  soft and nontender, bs+ Extremities:  no edema or cyanosis pulses intact distally Neurologic:  alert and oriented, moves all 4.   Impression & Recommendations:  Problem # 1:  OBSTRUCTIVE SLEEP APNEA (ICD-327.23) the pt has a h/o moderate osa, and had a good response to UP3/NSR in the past.  He is now having symptoms most c/w sleep disordered breathing.  His weight is neutral from last study, but it is unclear how much his surgery resolved his sleep apnea since no f/u study was done.  I think his history is suspicious enough to warrant a trial of treatment with cpap, and the pt is willing to try this.  If he has a poor response, or if he has poor tolerance of cpap again, would repeat his sleep study.  Medications Added to Medication List This Visit: 1)  Voltaren 1 % Gel (Diclofenac sodium) .... Apply 2 grams  qid as needed  Other Orders: Consultation Level IV [id number]) DME Referral (DME)  Patient Instructions: 1)  will restart cpap with full face mask.  Please call if issues with cpap tolerance. 2)  work on weight loss 3)  followup with me in 5 weeks.

## 2011-01-07 NOTE — Progress Notes (Signed)
Summary: sch cath   Phone Note Outgoing Call   Call placed by: Kevan Rosebush, RN,  January 23, 2010 3:25 PM Summary of Call: Per Dr [name] pts hgb stable sch pt for cath w/Dr Lia Foyer, pt sch for cath on 2/21 at 7:30, instructions refviewed w/pt over phone

## 2011-01-07 NOTE — Procedures (Signed)
Summary: Capsule prep/[facility]  Capsule prep/[facility]   Imported By: Bubba Hales 01/06/2010 10:16:18  _____________________________________________________________________  External Attachment:    Type:   Image     Comment:   External Document

## 2011-01-07 NOTE — Assessment & Plan Note (Signed)
Summary: 1 month fu/dt   Vital Signs:  Patient profile:   65 year old male Height:      69 inches Weight:      183.75 pounds BMI:     27.23 O2 Sat:      97 % on Room air Temp:     97.7 degrees F oral Pulse rate:   55 / minute Pulse rhythm:   regular Resp:     16 per minute BP sitting:   110 / 70  (left arm) Cuff size:   large  Vitals Entered By: Jiles Garter CMA (September 28, 2010 9:25 AM)  O2 Flow:  Room air CC: 1 Month Follow up , Type 2 diabetes mellitus follow-up Is Patient Diabetic? Yes Did you bring your meter with you today? Yes Pain Assessment Patient in pain? no      Comments low blood sugar 70' high 250 avg 84. patients states no concerns   Primary Care Provider:  DDrema Pry DO  CC:  1 Month Follow up  and Type 2 diabetes mellitus follow-up.  History of Present Illness:  Type 2 Diabetes Mellitus Follow-Up      This is a 65 year old man who presents for Type 2 diabetes mellitus follow-up.  The patient denies weight gain.  The patient denies the following symptoms: chest pain.  Since the last visit the patient reports good dietary compliance, compliance with medications, and monitoring blood glucose. feeling much better since signficant dietary change  .  less fatigue  Preventive Screening-Counseling & Management  Alcohol-Tobacco     Smoking Status: never  Allergies: 1)  ! Morphine  Past History:  Past Medical History: Diabetes Mellitus Type II - uncontrolled. CAD.....Marland Kitchenstatus post coronary artery bypass graft in 2000, status post cardiac catheterization in 2006,     2009..continued chest pain and shortness of breath despite oral medication adjustments including ranexa.     Catheter November 2009/...DES Stent..11/26/2008 to distal RCA leading to acute marginal.  /  cath..01/26/2010..continued patentcy of grafts and stent to RCA.Marland KitchenMarland KitchenModest progression of distal disease...medical Rx  /  [facility]  Apr 23, 2010... no MI... home.. Diovan stopped and into her dose  reduced for relative low blood pressure  CABG..2000  Obstructive sleep apnea.  I do not know if it is possible if this is playing any role in his overall general fatigue. Depression.  He is on sertraline for this.   Hyperlipidemia and he is tolerating simvastatin.   Good left ventricular function.   Palpitations.  He had sinus rhythm when he wore his  event recorder. Renal stones. Rrestless legs. ..on dopamine agonist. RBBB Depression / Bipolar  Median sternotomy suture... one is broken... noted on chest x-ray November, 2010... no clinical significance Dizziness Edema Hypertension SOB....cardiopulmonary exercise test... March 12, 2010... mild functional limitation.... no clear pulmonary or cardiac limitation.... possible deconditioning and mild chronotropic incompetence...( peak heart rate 130) Spondylosis.Marland KitchenMarland KitchenC5-6,C6-7  MRI...10/15/2009 MRI  Head....chronic microvascular ischemia....10/2009 Anemia..  hemoglobin 7.4.Marland KitchenMarland KitchenMarland KitchenMarland Kitchen iron deficiency... January, 2011.... endoscopy normal....2 unit transfusion in hospital  /  capsule endoscopy..February, 2011.... no small bowel abnormalities.... most likely source of bleeding was the gastric erosions..... meds adjusted.... patient to see Dr.Patterson soon Recommendation for disability   ... May 21, 2010 Falling spells    these have occurred in the past and are again recurring 2011     Past Surgical History: Nasal Septum repair- UP3 Coronary artery bypass graft 2000  percutaneous stenting of the distal right  coronary artery 11/2008  Family History: Mother deceased at age 30, has history of heart disease, died during coronary artery bypass graft.  Father deceased at age 76 from unknown arrhythmia.  The patient has 4 brothers, all have diabetes, 1 has a history of cerebrovascular accident and 1 has prostate cancer.    One of his brothers recently diagnosed to pancreatic cancer. Brother with recent diagnosis of CAD and CABG  sheduled. Nephew with cardiac arrest  Family History of Prostate Cancer:brother    Brother - hypothyroidism     Social History: The patient is married, has 4 daughters, 1 daughter lives with the patient.  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    Never Smoked    Alcohol use-no    Illicit Drug Use - no      Daily Caffeine Use coffee in the AM     Physical Exam  General:  alert, well-developed, and well-nourished.   Lungs:  normal respiratory effort, normal breath sounds, no crackles, and no wheezes.   Heart:  normal rate, regular rhythm, and no gallop.     Impression & Recommendations:  Problem # 1:  DIABETES MELLITUS, TYPE II (ICD-250.00) Assessment Improved pt to titrate lantus lower if AM blood sugars < 120-130.  pt to monitor for hypoglycemia  His updated medication list for this problem includes:    Lantus Solostar 100 Unit/ml Soln (Insulin glargine) ..... Inject 20 - 25 units daily    Metformin Hcl 1000 Mg Tabs (Metformin hcl) .Marland Kitchen... Take 1/2 tablet by mouth two times a day    Aspirin 81 Mg Tbec (Aspirin) .Marland Kitchen... Take one tablet by mouth daily    Novolog Penfill 100 Unit/ml Soln (Insulin aspart) ..... Inject 1-7 units subcutaneously sliding scale as needed  Labs Reviewed: Creat: 1.08 (08/20/2010)     Last Eye Exam: normal (06/16/2010) Reviewed HgBA1c results: 9.1 (08/20/2010)  7.7 (06/24/2010)  Complete Medication List: 1)  Lantus Solostar 100 Unit/ml Soln (Insulin glargine) .... Inject 20 - 25 units daily 2)  Metformin Hcl 1000 Mg Tabs (Metformin hcl) .... Take 1/2 tablet by mouth two times a day 3)  Simvastatin 40 Mg Tabs (Simvastatin) .... Take 1 tablet by mouth at bedtime 4)  Gabapentin 300 Mg Caps (Gabapentin) .Marland Kitchen.. 1 by mouth by mouth qpm 5)  Sertraline Hcl 100 Mg Tabs (Sertraline hcl) .... One by mouth two times a day 6)  Imdur 60 Mg Tb24 (Isosorbide mononitrate) .Marland Kitchen.. 1 tablet by mouth once daily 7)  Fish Oil 1000 Mg Caps (Omega-3 fatty acids)  .... Take two gel caps two times a day 8)  Nitrostat 0.4 Mg Subl (Nitroglycerin) .Marland Kitchen.. 1 tablet under tongue at onset of chest pain; you may repeat every 5 minutes for up to 3 doses. 9)  Voltaren 1 % Gel (Diclofenac sodium) .... Apply 2 grams qid as needed 10)  Pramipexole Dihydrochloride 0.25 Mg Tabs (Pramipexole dihydrochloride) .... One by mouth qhs 11)  Aspirin 81 Mg Tbec (Aspirin) .... Take one tablet by mouth daily 12)  Novolog Penfill 100 Unit/ml Soln (Insulin aspart) .... Inject 1-7 units subcutaneously sliding scale as needed 13)  Triamcinolone Acetonide 0.1 % Oint (Triamcinolone acetonide) .... Apply two times a day to dry hands 14)  Ferrous Sulfate 325 (65 Fe) Mg Tabs (Ferrous sulfate) .... Two times a day 15)  Pantoprazole Sodium 40 Mg Tbec (Pantoprazole sodium) .Marland Kitchen.. 1 by mouth once daily 16)  Novofine 30g X 8 Mm Misc (Insulin pen needle) .... Use for insulin injection three times a  day 17)  Carafate 1 Gm/67ml Susp (Sucralfate) .... One tbsp qid 18)  Plavix 75 Mg Tabs (Clopidogrel bisulfate) .... Take 1 tablet by mouth once a day 19)  Bystolic 5 Mg Tabs (Nebivolol hcl) .... Take 1 tablet by mouth once a day 20)  Levothyroxine Sodium 50 Mcg Tabs (Levothyroxine sodium) .... One by mouth once daily 21)  Freestyle Lite Test Strp (Glucose blood) .... Use to test blood test blood sugar three times a day  Patient Instructions: 1)  Please schedule a follow-up appointment in 3 months. 2)  BMP prior to visit, ICD-9:  401.9 3)  HbgA1C prior to visit, ICD-9:  250.00 4)  Urine Microalbumin prior to visit, ICD-9:  250.00 5)  Please return for lab work one (1) week before your next appointment.  Prescriptions: LANTUS SOLOSTAR 100 UNIT/ML SOLN (INSULIN GLARGINE) Inject 20 - 25 units daily  #1 month x 3   Entered and Authorized by:   D. Drema Pry DO   Signed by:   D. Drema Pry DO on 09/28/2010   Method used:   Electronically to        [facility] (retail)       [address]        [place], [place]  [address]       Ph: [phone] or [phone]       Fax: [phone]   RxID:   [id number]    Orders Added: 1)  Est. Patient Level III [id number]   Immunization History:  Influenza Immunization History:    Influenza:  historical (09/22/2010)   Immunization History:  Influenza Immunization History:    Influenza:  Historical (09/22/2010)  Current Allergies (reviewed today): ! MORPHINE

## 2011-01-07 NOTE — Letter (Signed)
Summary: out of work  [employer] [employer]  [address] [place]   [place], [place] [address]   Phone: [phone]  Fax: [phone]    05/21/2010  TO: [name] [name] IT MAY CONCERN   RE: [name] [name]   The above named individual is under my medical care and is to remain out of work until further notice.  If you have any further questions or need additional information, please call.     Sincerely,   Dola Argyle, MD Kevan Rosebush, RN

## 2011-01-07 NOTE — Miscellaneous (Signed)
  Clinical Lists Changes  Observations: Added new observation of PAST MED HX: Diabetes Mellitus Type II - uncontrolled. CAD.....Marland Kitchenstatus post coronary artery bypass graft in 2000, status post cardiac catheterization in 2006,     2009..continued chest pain and shortness of breath despite oral medication adjustments including ranexa.     Catheter November 2009/...DES Stent..11/26/2008 to distal RCA leading to acute marginal. CABG..2000 Obstructive sleep apnea.  I do not know if it is possible if this is playing any role in his overall general fatigue. Depression.  He is on sertraline for this.   Hyperlipidemia and he is tolerating simvastatin.  Good left ventricular function.  Palpitations.  He had sinus rhythm when he wore his  event recorder.  Renal stones. Rrestless legs. ..possible RBBB Depression / Bipolar  Median sternotomy suture... one is broken... noted on chest x-ray November, 2010... no clinical significance Dizziness Edema Hypertension SOB Spondylosis.Marland KitchenMarland KitchenC5-6,C6-7  MRI...10/15/2009 MRI  Head....chronic microvascular ischemia....10/2009 Anemia..  hemoglobin 7.4.Marland KitchenMarland KitchenMarland KitchenMarland Kitchen iron deficiency... January, 2011.... endoscopy normal....2 unit transfusion in hospital  /  capsule endoscopy pending   (12/31/2009 14:31) Added new observation of PRIMARY MD: [name] DO (12/31/2009 14:31)       Past History:  Past Medical History: Diabetes Mellitus Type II - uncontrolled. CAD.....Marland Kitchenstatus post coronary artery bypass graft in 2000, status post cardiac catheterization in 2006,     2009..continued chest pain and shortness of breath despite oral medication adjustments including ranexa.     Catheter November 2009/...DES Stent..11/26/2008 to distal RCA leading to acute marginal. CABG..2000 Obstructive sleep apnea.  I do not know if it is possible if this is playing any role in his overall general fatigue. Depression.  He is on sertraline for this.   Hyperlipidemia and he is tolerating  simvastatin.  Good left ventricular function.  Palpitations.  He had sinus rhythm when he wore his  event recorder.  Renal stones. Rrestless legs. ..possible RBBB Depression / Bipolar  Median sternotomy suture... one is broken... noted on chest x-ray November, 2010... no clinical significance Dizziness Edema Hypertension SOB Spondylosis.Marland KitchenMarland KitchenC5-6,C6-7  MRI...10/15/2009 MRI  Head....chronic microvascular ischemia....10/2009 Anemia..  hemoglobin 7.4.Marland KitchenMarland KitchenMarland KitchenMarland Kitchen iron deficiency... January, 2011.... endoscopy normal....2 unit transfusion in hospital  /  capsule endoscopy pending

## 2011-01-07 NOTE — Assessment & Plan Note (Signed)
Summary: 1 month fu/dt rsc with pt wife from [place]/mh[facility]   Vital Signs:  Patient profile:   65 year old male Weight:      187.50 pounds BMI:     27.79 O2 Sat:      97 % on Room air Temp:     98.0 degrees F oral Pulse rate:   65 / minute Pulse rhythm:   regular Resp:     16 per minute BP sitting:   116 / 70  (right arm) Cuff size:   large  Vitals Entered By: Jiles Garter CMA (June 26, 2010 9:56 AM)  O2 Flow:  Room air  CC: Rm 2- 1 Month Follow up disease management, Type 2 diabetes mellitus follow-up Is Patient Diabetic? Yes Did you bring your meter with you today? No Pain Assessment Patient in pain? no      Comments low blood sugar 75 high 200 avg 140's   Primary Care Provider:  [name] DO  CC:  Rm 2- 1 Month Follow up disease management and Type 2 diabetes mellitus follow-up.  History of Present Illness:  Type 2 Diabetes Mellitus Follow-Up      This is a 65 year old man who presents for Type 2 diabetes mellitus follow-up.  The patient denies hypoglycemia requiring help.  Since the last visit the patient reports compliance with medications, not exercising regularly, and monitoring blood glucose.    pt retired / medical disability noticed chest pain is better when he is at [employer] denies wheezing or chronic cough  reviewed labs - TSH is elevated brother has hypothyroidism hx of fatigue  htn - stable    Preventive Screening-Counseling & Management  Alcohol-Tobacco     Smoking Status: never  Allergies: 1)  ! Morphine  Past History:  Past Medical History: Diabetes Mellitus Type II - uncontrolled. CAD.....Marland Kitchenstatus post coronary artery bypass graft in 2000, status post cardiac catheterization in 2006,     2009..continued chest pain and shortness of breath despite oral medication adjustments including ranexa.     Catheter November 2009/...DES Stent..11/26/2008 to distal RCA leading to acute marginal.  /  cath..01/26/2010..continued patentcy of grafts and  stent to RCA.Marland KitchenMarland KitchenModest progression of distal disease...medical Rx  /  [facility]  Apr 23, 2010... no MI... home.. Diovan stopped and into her dose reduced for relative low blood pressure CABG..2000  Obstructive sleep apnea.  I do not know if it is possible if this is playing any role in his overall general fatigue. Depression.  He is on sertraline for this.   Hyperlipidemia and he is tolerating simvastatin.   Good left ventricular function.  Palpitations.  He had sinus rhythm when he wore his  event recorder. Renal stones. Rrestless legs. ..on dopamine agonist. RBBB Depression / Bipolar  Median sternotomy suture... one is broken... noted on chest x-ray November, 2010... no clinical significance Dizziness Edema Hypertension SOB....cardiopulmonary exercise test... March 12, 2010... mild functional limitation.... no clear pulmonary or cardiac limitation.... possible deconditioning and mild chronotropic incompetence...( peak heart rate 130) Spondylosis.Marland KitchenMarland KitchenC5-6,C6-7  MRI...10/15/2009 MRI  Head....chronic microvascular ischemia....10/2009 Anemia..  hemoglobin 7.4.Marland KitchenMarland KitchenMarland KitchenMarland Kitchen iron deficiency... January, 2011.... endoscopy normal....2 unit transfusion in hospital  /  capsule endoscopy..February, 2011.... no small bowel abnormalities.... most likely source of bleeding was the gastric erosions..... meds adjusted.... patient to see Dr.Patterson soon Recommendation for disability   ... May 21, 2010     Past Surgical History: Nasal Septum repair- UP3 Coronary artery bypass graft 2000  percutaneous stenting of the distal right  coronary artery 11/2008            Family History: Mother deceased at age 68, has history of heart disease, died during coronary artery bypass graft.  Father deceased at age 1 from unknown arrhythmia.  The patient has 4 brothers, all have diabetes, 1 has a history of cerebrovascular accident and 1 has prostate cancer.    One of his brothers recently diagnosed to pancreatic  cancer. Brother with recent diagnosis of CAD and CABG sheduled. Nephew with cardiac arrest  Family History of Prostate Cancer:brother    Brother - hypothyroidism  Social History: The patient is married, has 4 daughters, 1 daughter lives with the patient.  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    Never Smoked   Alcohol use-no    Illicit Drug Use - no      Daily Caffeine Use coffee in the AM     Review of Systems  The patient denies prolonged cough.    Physical Exam  General:  alert, well-developed, and well-nourished.   Neck:  No deformities, masses, or tenderness noted. Lungs:  normal respiratory effort, normal breath sounds, no crackles, and no wheezes.   Heart:  normal rate, regular rhythm, and no gallop.   Extremities:  No lower extremity edema  Neurologic:  cranial nerves II-XII intact and gait normal.   Psych:  normally interactive, good eye contact, not anxious appearing, and not depressed appearing.     Impression & Recommendations:  Problem # 1:  HYPOTHYROIDISM (ICD-244.9) probable autoimmune hypothyroidism.  start thyroid replacement.  goal TSH between 1-2 His updated medication list for this problem includes:    Levothyroxine Sodium 25 Mcg Tabs (Levothyroxine sodium) ..... One by mouth once daily    Levothyroxine Sodium 50 Mcg Tabs (Levothyroxine sodium) ..... One by mouth once daily  Labs Reviewed: TSH: 8.183 (06/24/2010)    HgBA1c: 7.7 (06/24/2010) Chol: 88 (06/24/2010)   HDL: 30 (06/24/2010)   LDL: 40 (06/24/2010)   TG: 90 (06/24/2010)  Problem # 2:  CHEST PAIN UNSPECIFIED (ICD-786.50) Assessment: Improved pt has noticed chest pain better esp when [place] is [place] the beach. consider RAD as etiology trial of low dose Qvar pt instructed on proper use of Qvar  Problem # 3:  DIABETES MELLITUS, TYPE II (ICD-250.00) stable.  regular low intensity exercise encouraged.  pt to monitor for hypoglycemia.   pt instructed to hold glimepiride on  exercise days  His updated medication list for this problem includes:    Glimepiride 2 Mg Tabs (Glimepiride) ..... One by mouth once daily    Lantus Solostar 100 Unit/ml Soln (Insulin glargine) ..... Inject 28 - 35 units daily    Metformin Hcl 1000 Mg Tabs (Metformin hcl) .Marland Kitchen... Take 1 tablet by mouth two times a day    Aspirin 81 Mg Tbec (Aspirin) .Marland Kitchen... Take one tablet by mouth daily    Novolog Penfill 100 Unit/ml Soln (Insulin aspart) ..... Inject 1-7 units subcutaneously sliding scale as needed  Labs Reviewed: Creat: 0.98 (06/24/2010)     Last Eye Exam: normal (06/16/2010) Reviewed HgBA1c results: 7.7 (06/24/2010)  7.8 (03/06/2010)  Problem # 4:  HYPERTENSION (ICD-401.9) stable.  Maintain current medication regimen.  His updated medication list for this problem includes:    Bystolic 10 Mg Tabs (Nebivolol hcl) .Marland Kitchen... Take 1  tab daily as directed  BP today: 116/70 Prior BP: 108/70 (05/22/2010)  Labs Reviewed: K+: 4.3 (06/24/2010) Creat: : 0.98 (06/24/2010)   Chol: 88 (06/24/2010)   HDL:  30 (06/24/2010)   LDL: 40 (06/24/2010)   TG: 90 (06/24/2010)  Complete Medication List: 1)  Glimepiride 2 Mg Tabs (Glimepiride) .... One by mouth once daily 2)  Lantus Solostar 100 Unit/ml Soln (Insulin glargine) .... Inject 28 - 35 units daily 3)  Metformin Hcl 1000 Mg Tabs (Metformin hcl) .... Take 1 tablet by mouth two times a day 4)  Simvastatin 40 Mg Tabs (Simvastatin) .... Take 1 tablet by mouth at bedtime 5)  Gabapentin 300 Mg Caps (Gabapentin) .Marland Kitchen.. 1 by mouth two times a day 6)  Sertraline Hcl 100 Mg Tabs (Sertraline hcl) .... Take 1 tablet by mouth two times a day 7)  Imdur 60 Mg Tb24 (Isosorbide mononitrate) .... 1/2 tablet by mouth once daily 8)  Fish Oil 1000 Mg Caps (Omega-3 fatty acids) .... Take two gel caps two times a day 9)  Nitroglycerin 0.4 Mg Subl (Nitroglycerin) .... Disolve under tounge as needed 10)  Voltaren 1 % Gel (Diclofenac sodium) .... Apply 2 grams qid as  needed 11)  Pramipexole Dihydrochloride 0.25 Mg Tabs (Pramipexole dihydrochloride) .... One by mouth qhs 12)  Aspirin 81 Mg Tbec (Aspirin) .... Take one tablet by mouth daily 13)  Novolog Penfill 100 Unit/ml Soln (Insulin aspart) .... Inject 1-7 units subcutaneously sliding scale as needed 14)  Triamcinolone Acetonide 0.1 % Oint (Triamcinolone acetonide) .... Apply two times a day to dry hands 15)  Ferrous Sulfate 325 (65 Fe) Mg Tabs (Ferrous sulfate) .... Two times a day 16)  Pantoprazole Sodium 40 Mg Tbec (Pantoprazole sodium) .Marland Kitchen.. 1 by mouth once daily 17)  Novofine 30g X 8 Mm Misc (Insulin pen needle) .... Use for insulin injection three times a day 18)  Carafate 1 Gm/77ml Susp (Sucralfate) .... One tbsp qid 19)  Plavix 75 Mg Tabs (Clopidogrel bisulfate) .... Take 1 tablet by mouth once a day 20)  Bystolic 10 Mg Tabs (Nebivolol hcl) .... Take 1  tab daily as directed 21)  Levothyroxine Sodium 25 Mcg Tabs (Levothyroxine sodium) .... One by mouth once daily 22)  Levothyroxine Sodium 50 Mcg Tabs (Levothyroxine sodium) .... One by mouth once daily 23)  Qvar 40 Mcg/act Aers (Beclomethasone dipropionate) .... 2 puffs two times a day  Patient Instructions: 1)  Starting regular exercise program 2)  Please schedule a follow-up appointment in 2 months. 3)  TSH prior to visit, ICD-9: 244.90 4)  Thyriod antibodies:  244.90 5)  Please return for lab work one (1) week before your next appointment.  Prescriptions: QVAR 40 MCG/ACT AERS (BECLOMETHASONE DIPROPIONATE) 2 puffs two times a day  #1 x 2   Entered and Authorized by:   D. Drema Pry DO   Signed by:   D. Drema Pry DO on 06/26/2010   Method used:   Electronically to        [facility] (retail)       [address]       [place], [place]  [address]       Ph: [phone] or [phone]       Fax: [phone]   RxID:   [id number] LEVOTHYROXINE SODIUM 50 MCG TABS (LEVOTHYROXINE SODIUM) one by mouth once daily  #30 x 1   Entered and  Authorized by:   D. Drema Pry DO   Signed by:   D. Drema Pry DO on 06/26/2010   Method used:   Print then Give to Patient   RxID:   [id number] LEVOTHYROXINE SODIUM 25 MCG TABS (LEVOTHYROXINE SODIUM) one by  mouth once daily  #30 x 0   Entered and Authorized by:   D. Drema Pry DO   Signed by:   D. Drema Pry DO on 06/26/2010   Method used:   Electronically to        [facility] (retail)       [address]       [place], [place]  [address]       Ph: [phone] or [phone]       Fax: [phone]   RxID:   [id number]   Current Allergies (reviewed today): ! MORPHINE

## 2011-01-07 NOTE — Assessment & Plan Note (Signed)
Summary: per check out/sf      Allergies Added:   Visit Type:  Follow-up Referring [name] [name]:  [name] [name] Primary [name] [name]:  [name] Books DO  CC:  CAD.  History of Present Illness: The patient is seen for cardiology followup.  He has some mild chest pain but this is relatively stable for him as long as he is not undergoing excessive activity.  He continues to apply for disability and I am in full agreement with this.  Current Medications (verified): 1)  Lantus Solostar 100 Unit/ml Soln (Insulin Glargine) .... Inject 20 - 25 Units Daily 2)  Metformin Hcl 1000 Mg  Tabs (Metformin Hcl) .... Take 1/2 Tablet By Mouth Two Times A Day 3)  Simvastatin 40 Mg  Tabs (Simvastatin) .... Take 1 Tablet By Mouth At Bedtime 4)  Gabapentin 300 Mg  Caps (Gabapentin) .Marland Kitchen.. 1 By Mouth By Mouth Qpm 5)  Sertraline Hcl 100 Mg  Tabs (Sertraline Hcl) .... One By Mouth Two Times A Day 6)  Imdur 60 Mg  Tb24 (Isosorbide Mononitrate) .Marland Kitchen.. 1 Tablet By Mouth Once Daily 7)  Fish Oil 1000 Mg Caps (Omega-3 Fatty Acids) .... Take Two Gel Caps Two Times A Day 8)  Nitrostat 0.4 Mg Subl (Nitroglycerin) .Marland Kitchen.. 1 Tablet Under Tongue At Onset of Chest Pain; You May Repeat Every 5 Minutes For Up To 3 Doses. 9)  Voltaren 1 % Gel (Diclofenac Sodium) .... Apply 2 Grams Qid As Needed 10)  Pramipexole Dihydrochloride 0.25 Mg Tabs (Pramipexole Dihydrochloride) .... One By Mouth Qhs 11)  Aspirin 81 Mg Tbec (Aspirin) .... Take One Tablet By Mouth Daily 12)  Novolog Penfill 100 Unit/ml Soln (Insulin Aspart) .... Inject 1-7 Units Subcutaneously Sliding Scale As Needed 13)  Triamcinolone Acetonide 0.1 % Oint (Triamcinolone Acetonide) .... Apply Two Times A Day To Dry Hands 14)  Ferrous Sulfate 325 (65 Fe) Mg  Tabs (Ferrous Sulfate) .... Two Times A Day 15)  Pantoprazole Sodium 40 Mg Tbec (Pantoprazole Sodium) .Marland Kitchen.. 1 By Mouth Once Daily 16)  Novofine 30g X 8 Mm Misc (Insulin Pen Needle) .... Use For Insulin Injection Three Times A Day 17)   Carafate 1 Gm/88ml  Susp (Sucralfate) .... One Tbsp Qid 18)  Plavix 75 Mg Tabs (Clopidogrel Bisulfate) .... Take 1 Tablet By Mouth Once A Day 19)  Bystolic 5 Mg Tabs (Nebivolol Hcl) .... Take 1 Tablet By Mouth Once A Day 20)  Levothyroxine Sodium 50 Mcg Tabs (Levothyroxine Sodium) .... One By Mouth Once Daily 21)  Freestyle Lite Test  Strp (Glucose Blood) .... Use To Test Blood Test Blood Sugar Three Times A Day  Allergies (verified): 1)  ! Morphine  Past History:  Past Medical History: Diabetes Mellitus Type II - uncontrolled. CAD.....Marland Kitchenstatus post coronary artery bypass graft in 2000, status post cardiac catheterization in 2006,     2009..continued chest pain and shortness of breath despite oral medication adjustments including ranexa.     Catheter November 2009/...DES Stent..11/26/2008 to distal RCA leading to acute marginal.  /  cath..01/26/2010..continued patentcy of grafts and stent to RCA.Marland KitchenMarland KitchenModest progression of distal disease...medical Rx  /  [facility]  Apr 23, 2010... no MI... home.. Diovan stopped and into her dose reduced for relative low blood pressure CABG..2000  Obstructive sleep apnea.  I do not know if it is possible if this is playing any role in his overall general fatigue. Depression.  He is on sertraline for this.   Hyperlipidemia and he is tolerating simvastatin.   Good left ventricular function.  Palpitations.  He had sinus rhythm when he wore his  event recorder... Renal stones. Rrestless legs. ..on dopamine agonist. RBBB Depression / Bipolar  Median sternotomy suture... one is broken... noted on chest x-ray November, 2010... no clinical significance Dizziness Edema Hypertension SOB....cardiopulmonary exercise test... March 12, 2010... mild functional limitation.... no clear pulmonary or cardiac limitation.... possible deconditioning and mild chronotropic incompetence...( peak heart rate 130) Spondylosis.Marland KitchenMarland KitchenC5-6,C6-7  MRI...10/15/2009 MRI  Head....chronic microvascular  ischemia....10/2009 Anemia..  hemoglobin 7.4.Marland KitchenMarland KitchenMarland KitchenMarland Kitchen iron deficiency... January, 2011.... endoscopy normal....2 unit transfusion in hospital  /  capsule endoscopy..February, 2011.... no small bowel abnormalities.... most likely source of bleeding was the gastric erosions..... meds adjusted.... patient to see Dr.Patterson soon Recommendation for disability   ... May 21, 2010 Falling spells    these have occurred in the past and are again recurring 2011     Review of Systems       Patient denies fever, chills, headache, sweats, rash, change in vision, change in hearing, chest pain, cough, nausea vomiting, urinary symptoms.  All other systems are reviewed and are negative.  Vital Signs:  Patient profile:   65 year old male Height:      69 inches Weight:      185 pounds BMI:     27.42 Pulse rate:   70 / minute BP sitting:   110 / 50  (right arm) Cuff size:   regular  Vitals Entered By: [name] [name], RMA (October 08, 2010 10:54 AM)  Physical Exam  General:  patient is stable. Eyes:  no xanthelasma. Neck:  no jugular venous distention. Lungs:  lungs are clear.  Respiratory effort is nonlabored. Heart:  cardiac exam reveals S1-S2.  There are no clicks or significant murmurs. Abdomen:  abdomen is soft. Extremities:  no peripheral edema.   Impression & Recommendations:  Problem # 1:  * FALLING SPELLS no recent falling spells.  Problem # 2:  CORONARY ATHEROSCLEROSIS NATIVE CORONARY ARTERY (ICD-414.01) coronary disease is stable today.  No change in therapy.  We had a careful discussion about his exercise program.  He is to do aerobic exercise.  Problem # 3:  PALPITATIONS (ICD-785.1)  His updated medication list for this problem includes:    Imdur 60 Mg Tb24 (Isosorbide mononitrate) .Marland Kitchen... 1 tablet by mouth once daily    Nitrostat 0.4 Mg Subl (Nitroglycerin) .Marland Kitchen... 1 tablet under tongue at onset of chest pain; you may repeat every 5 minutes for up to 3 doses.    Aspirin 81 Mg Tbec  (Aspirin) .Marland Kitchen... Take one tablet by mouth daily    Plavix 75 Mg Tabs (Clopidogrel bisulfate) .Marland Kitchen... Take 1 tablet by mouth once a day    Bystolic 5 Mg Tabs (Nebivolol hcl) .Marland Kitchen... Take 1 tablet by mouth once a day no significant palpitations recently.  Problem # 4:  DIABETES MELLITUS, TYPE II (ICD-250.00)  His updated medication list for this problem includes:    Lantus Solostar 100 Unit/ml Soln (Insulin glargine) ..... Inject 20 - 25 units daily    Metformin Hcl 1000 Mg Tabs (Metformin hcl) .Marland Kitchen... Take 1/2 tablet by mouth two times a day    Aspirin 81 Mg Tbec (Aspirin) .Marland Kitchen... Take one tablet by mouth daily    Novolog Penfill 100 Unit/ml Soln (Insulin aspart) ..... Inject 1-7 units subcutaneously sliding scale as needed Is getting Advice from Dr. Shawna Orleans and he is taking it seriously.  He has lost some weight and he is watching his diet much better.  Patient Instructions: 1)  Follow  up in 3 months.

## 2011-01-07 NOTE — Procedures (Signed)
Summary: Capsule Endo, 280.0  Patient here today for capsule endoscopy for Dr. Sharlett Iles .  Pt verbalized understanding of all verbal and written instructions.  Pt tolerated well.  Lot #  [id number]  exp 2012-01 .

## 2011-01-07 NOTE — Progress Notes (Signed)
   Request 4 Records recieved from [facility] forwarded to [facility]  June 17, 2010 12:46 PM

## 2011-01-07 NOTE — Miscellaneous (Signed)
Summary: Lab Orders  Clinical Lists Changes  Orders: Added new Test order of T-Basic Metabolic Panel ([id number]) - Signed Added new Test order of T- Hemoglobin A1C ([id number]) - Signed 

## 2011-01-07 NOTE — Letter (Signed)
Summary: Cardiac Catheterization Instructions- [place] [place]  [id number] [place]   [place], [place] [address]   Phone: [phone]  Fax: [phone]     12/24/2009 MRN: [id number]  [facility] [address] [place],   [address]  Dear Mr. Zenon,   You are scheduled for a Cardiac Catheterization on Friday 12/26/09 with Dr. Lia Foyer  Please arrive to the 1st floor of the Heart and Vascular Center at [facility] at 9:30 am / pm on the day of your procedure. Please do not arrive before 6:30 a.m. Call the Heart and Vascular Center at [phone] if you are unable to make your appointmnet. The Code to get into the parking garage under the building is 0090. Take the elevators to the 1st floor. You must have someone to drive you home. Someone must be with you for the first 24 hours after you arrive home. Please wear clothes that are easy to get on and off and wear slip-on shoes. Do not eat or drink after midnight except water with your medications that morning. Bring all your medications and current insurance cards with you.  _X__ DO NOT take these medications before your procedure:   AM of test hold glimepiride, Lantus, and Novolog   Hold Metformin day of test and for 2 days after  ___ Make sure you take your aspirin.  ___ You may take ALL of your medications with water that morning. ________________________________________________________________________________________________________________________________  ___ DO NOT take ANY medications before your procedure.  ___ Pre-med instructions:  ________________________________________________________________________________________________________________________________  The usual length of stay after your procedure is 2 to 3 hours. This can vary.  If you have any questions, please call the office at the number listed above.   Kevan Rosebush, RN

## 2011-01-07 NOTE — Letter (Signed)
   [facility] [place], [place]  [address]  [place] Phone: [phone]      November 27, 2010   [place], [place] [address]  RE:  LAB RESULTS  Dear  [name] [name],  The following is an interpretation of your most recent lab tests.  Please take note of any instructions provided or changes to medications that have resulted from your lab work.  PSA:  normal - no follow-up needed PSA: 2.39  ELECTROLYTES:  Good - no changes needed  KIDNEY FUNCTION TESTS:  Good - no changes needed   THYROID STUDIES:  Increase thyroid medication (see enclosed Rx) TSH: 4.434     DIABETIC STUDIES:  Good - no changes needed Blood Glucose: 152   HgbA1C: 6.9   Microalbumin/Creatinine Ratio: 5.4     CBC:  Good - no changes needed   Your A1c looks great !         Sincerely Yours,    Dr. Drema Pry  Appended Document:  mailed

## 2011-01-07 NOTE — Assessment & Plan Note (Signed)
Summary: f2w  Medications Added PLAVIX 75 MG TABS (CLOPIDOGREL BISULFATE) ON HOLD FERROUS SULFATE 325 (65 FE) MG  TABS (FERROUS SULFATE) on hold till endoscopy      Allergies Added:   Visit Type:  posthospital Primary Provider:  [name] DO  CC:  coronary disease.  History of Present Illness: Patient was seen last in the office on December 23, 2009.  We are planning for him to undergo cardiac catheterization.  However his screening lab revealed a hemoglobin of 7.4.  He was hospitalized and transfused and he is feeling better.  He had an endoscopy that showed no bleeding.  He will be having a capsule endoscopy to assess the small bowel.  He's not been having any major chest pain.  He seems stable in general.  Current Medications (verified): 1)  Glimepiride 2 Mg Tabs (Glimepiride) .... One By Mouth Once Daily 2)  Lantus Solostar 100 Unit/ml Soln (Insulin Glargine) .... Inject 28 - 35 Units Daily 3)  Metformin Hcl 1000 Mg  Tabs (Metformin Hcl) .... Take 1 Tablet By Mouth Two Times A Day 4)  Simvastatin 40 Mg  Tabs (Simvastatin) .... Take 1 Tablet By Mouth At Bedtime 5)  Gabapentin 300 Mg  Caps (Gabapentin) .Marland Kitchen.. 1 By Mouth Two Times A Day 6)  Diovan 40 Mg  Tabs (Valsartan) .... Take 1 Tablet By Mouth Once A Day 7)  Sertraline Hcl 100 Mg  Tabs (Sertraline Hcl) .... Take 1 Tablet By Mouth Two Times A Day 8)  Imdur 60 Mg  Tb24 (Isosorbide Mononitrate) .Marland Kitchen.. 1 Tab Daily 9)  Plavix 75 Mg Tabs (Clopidogrel Bisulfate) .... On Hold 10)  Carvedilol 12.5 Mg Tabs (Carvedilol) .... Take One Tablet By Mouth Twice A Day 11)  Fish Oil 1000 Mg Caps (Omega-3 Fatty Acids) .... Take Two Gel Caps Two Times A Day 12)  Nitroglycerin 0.4 Mg Subl (Nitroglycerin) .... Disolve Under Tounge As Needed 13)  Voltaren 1 % Gel (Diclofenac Sodium) .... Apply 2 Grams Qid 14)  Pramipexole Dihydrochloride 0.25 Mg Tabs (Pramipexole Dihydrochloride) .... One By Mouth Qhs 15)  Vicodin 5-500 Mg Tabs  (Hydrocodone-Acetaminophen) .... Take 1 Tablet By Mouth Two Times A Day As Needed 16)  Aspirin 325 Mg  Tabs (Aspirin) .Marland Kitchen.. 1 By Mouth Daily 17)  Novolog Penfill 100 Unit/ml Soln (Insulin Aspart) .... Inject 1-7 Units Subcutaneously Sliding Scale As Needed 18)  Metaxalone 800 Mg Tabs (Metaxalone) .... One By Mouth At Bedtime As Needed 19)  Triamcinolone Acetonide 0.1 % Oint (Triamcinolone Acetonide) .... Apply Two Times A Day To Dry Hands 20)  Ferrous Sulfate 325 (65 Fe) Mg  Tabs (Ferrous Sulfate) .... On Hold Till Endoscopy  Allergies (verified): 1)  ! Morphine  Past History:  Past Medical History: Last updated: 12/31/2009 Diabetes Mellitus Type II - uncontrolled. CAD.....Marland Kitchenstatus post coronary artery bypass graft in 2000, status post cardiac catheterization in 2006,     2009..continued chest pain and shortness of breath despite oral medication adjustments including ranexa.     Catheter November 2009/...DES Stent..11/26/2008 to distal RCA leading to acute marginal. CABG..2000 Obstructive sleep apnea.  I do not know if it is possible if this is playing any role in his overall general fatigue. Depression.  He is on sertraline for this.   Hyperlipidemia and he is tolerating simvastatin.  Good left ventricular function.  Palpitations.  He had sinus rhythm when he wore his  event recorder.  Renal stones. Rrestless legs. ..possible RBBB Depression / Bipolar  Median sternotomy suture.Marland KitchenMarland Kitchen  one is broken... noted on chest x-ray November, 2010... no clinical significance Dizziness Edema Hypertension SOB Spondylosis.Marland KitchenMarland KitchenC5-6,C6-7  MRI...10/15/2009 MRI  Head....chronic microvascular ischemia....10/2009 Anemia..  hemoglobin 7.4.Marland KitchenMarland KitchenMarland KitchenMarland Kitchen iron deficiency... January, 2011.... endoscopy normal....2 unit transfusion in hospital  /  capsule endoscopy pending    Review of Systems       Patient denies fever, chills, sweats, rash.  He still has some mild headaches.  Has no change in vision or hearing.  Is not  having chest pain or cough.  There is no nausea vomiting or urinary symptoms.  All of the systems are reviewed and are negative.  Vital Signs:  Patient profile:   65 year old male Height:      69 inches Weight:      188 pounds BMI:     27.86 Pulse rate:   70 / minute BP sitting:   132 / 62  (left arm) Cuff size:   regular  Vitals Entered By: Mignon Pine, RMA (January 01, 2010 4:10 PM)  Physical Exam  General:  patient is stable in general today. Eyes:  no xanthelasma. Neck:  no jugular venous distention. Lungs:  lungs are clear respiratory effort is nonlabored. Heart:  cardiac exam reveals S1 and S2.  No clicks or significant murmurs. Abdomen:  abdomen is soft. Extremities:  no peripheral edema. Psych:  patient is oriented to person time and place.  Affect is normal.   Impression & Recommendations:  Problem # 1:  ANEMIA (N067566.9)  CBC will be checked today.  We will follow up the results of his capsule endoscopy.  Orders: TLB-CBC Platelet - w/Differential (85025-CBCD)  Problem # 2:  SHORTNESS OF BREATH (ICD-786.05)  His updated medication list for this problem includes:    Diovan 40 Mg Tabs (Valsartan) .Marland Kitchen... Take 1 tablet by mouth once a day    Carvedilol 12.5 Mg Tabs (Carvedilol) .Marland Kitchen... Take one tablet by mouth twice a day    Aspirin 325 Mg Tabs (Aspirin) .Marland Kitchen... 1 by mouth daily Shortness of breath is somewhat better today.  Follow him.  Problem # 3:  CORONARY ARTERY DISEASE (ICD-414.00)  His updated medication list for this problem includes:    Imdur 60 Mg Tb24 (Isosorbide mononitrate) .Marland Kitchen... 1 tab daily    Plavix 75 Mg Tabs (Clopidogrel bisulfate) ..... On hold    Carvedilol 12.5 Mg Tabs (Carvedilol) .Marland Kitchen... Take one tablet by mouth twice a day    Nitroglycerin 0.4 Mg Subl (Nitroglycerin) .Marland Kitchen... Disolve under tounge as needed    Aspirin 325 Mg Tabs (Aspirin) .Marland Kitchen... 1 by mouth daily Coronary disease is stable at this time.  I will follow him as we see what happens  with his anemia.  Patient Instructions: 1)  Labs today  2)  Follow up in 4 weeks

## 2011-01-07 NOTE — Progress Notes (Signed)
Summary: disability info    Phone Note Call from Patient Call back at Home Phone [phone]   Caller: Patient Reason for Call: Talk to Nurse Summary of Call:  per pt calling back with info - disability # [id number] Initial call taken by: Neil Crouch,  July 22, 2010 4:12 PM  Follow-up for Phone Call        letter faxed to [place]: Raquel at [phone] pt aware, copy mailed to pt Kevan Rosebush, RN  July 22, 2010 5:48 PM

## 2011-01-07 NOTE — Progress Notes (Signed)
Summary: Needs a capsule endo scheduled   Phone Note Call from Patient Call back at [place]   Call For: Dr.Kasiyah Platter Reason for Call: Talk to Nurse Summary of Call: Was told we would call him with an appoinment to have a capsule endo. Initial call taken by: Irwin Brakeman [facility],  December 30, 2009 2:14 PM  Follow-up for Phone Call        Talked with pt.  He is taking iron suppliments. WIll check with Marisue Humble CMA re appt  time for this pt and call pt back details.  Pt states he will come by for insturctions as needed. Follow-up by: Alberteen Spindle RN,  December 31, 2009 8:59 AM  Additional Follow-up for Phone Call Additional follow up Details #1::        Spoke with pt and he scheduled the Capsule Endo  for 01-09-10 at [facility].  Pt is coming in Fri 01-02-10 after he drops his daughter off at school to get his instructions.  I told him to ask for me.  I advised him to stop his iron supp on Fri 01-02-10 and hold it until day after his Capsule Endo on 01-09-10. Additional Follow-up by: Sharol Roussel,  December 31, 2009 10:35 AM     Appended Document: Needs a capsule endo scheduled Pt call back and asked me to change the date for the Capsule Endo to 01-07-10.  He is still coming in on Friday 01-02-10 for his instructions.  I told him to ask for Pam.  Appended Document: Needs a capsule endo scheduled    Clinical Lists Changes  Problems: Added new problem of ANEMIA (ICD-285.9) Orders: Added new Test order of Capsule Endoscopy (Capsule Endoscopy) - Signed

## 2011-01-07 NOTE — Assessment & Plan Note (Signed)
Summary: 4WK F/U SL  Medications Added PLAVIX 75 MG TABS (CLOPIDOGREL BISULFATE) Take 1 tablet by mouth once a day FERROUS SULFATE 325 (65 FE) MG  TABS (FERROUS SULFATE) two times a day ACIPHEX 20 MG  TBEC (RABEPRAZOLE SODIUM) Take 1 tablet by mouth two times a day      Allergies Added:   Visit Type:  Follow-up Referring Provider:  Verl Blalock Primary Provider:  [name] DO   History of Present Illness: The patient is seen for cardiology followup.  I saw him last January 27,2011.  At that time he was posthospitalization.  We had been planning cardiac catheterization but his hemoglobin was 7.4.  He was hospitalized and transfused.  Endoscopy revealed gastric erosions.  Then as an outpatient he had a capsule study of the small bowel.  This did not show any small bowel lesions.  I spoke today with Dr.Patterson about the findings.  He felt that the overall data was compatible with gastric erosions.  The plan is for him to be on aspirin but not Plavix.  He is good to continue his iron.  He also has an appointment now to see Dr. Sharlett Iles on February 25.  Also he will increase his AcipHex to b.i.d.  Today unfortunately he is complaining of pressure in his chest.  I cannot tell if this is GI or cardiac in origin.  He also has had another falling spell at work.  He has some discomfort in his legs.  Current Medications (verified): 1)  Glimepiride 2 Mg Tabs (Glimepiride) .... One By Mouth Once Daily 2)  Lantus Solostar 100 Unit/ml Soln (Insulin Glargine) .... Inject 28 - 35 Units Daily 3)  Metformin Hcl 1000 Mg  Tabs (Metformin Hcl) .... Take 1 Tablet By Mouth Two Times A Day 4)  Simvastatin 40 Mg  Tabs (Simvastatin) .... Take 1 Tablet By Mouth At Bedtime 5)  Gabapentin 300 Mg  Caps (Gabapentin) .Marland Kitchen.. 1 By Mouth Two Times A Day 6)  Diovan 40 Mg  Tabs (Valsartan) .... Take 1 Tablet By Mouth Once A Day 7)  Sertraline Hcl 100 Mg  Tabs (Sertraline Hcl) .... Take 1 Tablet By Mouth Two Times A  Day 8)  Imdur 60 Mg  Tb24 (Isosorbide Mononitrate) .Marland Kitchen.. 1 Tab Daily 9)  Plavix 75 Mg Tabs (Clopidogrel Bisulfate) .... Take 1 Tablet By Mouth Once A Day 10)  Carvedilol 12.5 Mg Tabs (Carvedilol) .... Take One Tablet By Mouth Twice A Day 11)  Fish Oil 1000 Mg Caps (Omega-3 Fatty Acids) .... Take Two Gel Caps Two Times A Day 12)  Nitroglycerin 0.4 Mg Subl (Nitroglycerin) .... Disolve Under Tounge As Needed 13)  Voltaren 1 % Gel (Diclofenac Sodium) .... Apply 2 Grams Qid 14)  Pramipexole Dihydrochloride 0.25 Mg Tabs (Pramipexole Dihydrochloride) .... One By Mouth Qhs 15)  Vicodin 5-500 Mg Tabs (Hydrocodone-Acetaminophen) .... Take 1 Tablet By Mouth Two Times A Day As Needed 16)  Aspirin 325 Mg  Tabs (Aspirin) .Marland Kitchen.. 1 By Mouth Daily 17)  Novolog Penfill 100 Unit/ml Soln (Insulin Aspart) .... Inject 1-7 Units Subcutaneously Sliding Scale As Needed 18)  Metaxalone 800 Mg Tabs (Metaxalone) .... One By Mouth At Bedtime As Needed 19)  Triamcinolone Acetonide 0.1 % Oint (Triamcinolone Acetonide) .... Apply Two Times A Day To Dry Hands 20)  Ferrous Sulfate 325 (65 Fe) Mg  Tabs (Ferrous Sulfate) .... Two Times A Day 21)  Aciphex 20 Mg  Tbec (Rabeprazole Sodium) .... Take 1 Each Day 30 Minutes Before  Breakfast 22)  Novofine 30g X 8 Mm Misc (Insulin Pen Needle) .... Use For Insulin Injection Three Times A Day  Allergies (verified): 1)  ! Morphine  Past History:  Past Surgical History: Last updated: 12/19/2009 Nasal Septum repair Coronary artery bypass graft 2000  percutaneous stenting of the distal right  coronary artery 11/2008         Past Medical History: Diabetes Mellitus Type II - uncontrolled. CAD.....Marland Kitchenstatus post coronary artery bypass graft in 2000, status post cardiac catheterization in 2006,     2009..continued chest pain and shortness of breath despite oral medication adjustments including ranexa.     Catheter November 2009/...DES Stent..11/26/2008 to distal RCA leading to acute  marginal. CABG..2000 Obstructive sleep apnea.  I do not know if it is possible if this is playing any role in his overall general fatigue. Depression.  He is on sertraline for this.   Hyperlipidemia and he is tolerating simvastatin.  Good left ventricular function.  Palpitations.  He had sinus rhythm when he wore his  event recorder.  Renal stones. Rrestless legs. ..possible RBBB Depression / Bipolar  Median sternotomy suture... one is broken... noted on chest x-ray November, 2010... no clinical significance Dizziness Edema Hypertension SOB Spondylosis.Marland KitchenMarland KitchenC5-6,C6-7  MRI...10/15/2009 MRI  Head....chronic microvascular ischemia....10/2009 Anemia..  hemoglobin 7.4.Marland KitchenMarland KitchenMarland KitchenMarland Kitchen iron deficiency... January, 2011.... endoscopy normal....2 unit transfusion in hospital  /  capsule endoscopy..February, 2011.... no small bowel abnormalities.... most likely source of bleeding was the gastric erosions..... meds adjusted.... patient to see Dr.Patterson soon    Review of Systems       Patient denies fever, chills, headache, sweats, rash, change in vision, change in hearing.  He does have dizziness.  He has no nausea or vomiting.  There no urinary symptoms.  Vital Signs:  Patient profile:   65 year old male Height:      69 inches Weight:      189 pounds BMI:     28.01 Pulse rate:   65 / minute BP sitting:   108 / 58  (left arm) Cuff size:   regular  Vitals Entered By: Mignon Pine, RMA (January 22, 2010 3:49 PM)  Physical Exam  General:  patient is stable today. Head:  head is atraumatic. Eyes:  no xanthelasma. Neck:  no jugular venous distention. Chest Wall:  no chest wall tenderness. Lungs:  lungs are clear.  Respiratory effort is nonlabored. Heart:  cardiac exam reveals S1 and S2.  There are no clicks or significant murmurs. Abdomen:  abdomen is soft. Msk:  no musculoskeletal deformities. Extremities:  no peripheral edema. Skin:  no skin rashes. Psych:  patient is oriented to person  time and place.  Affect is normal.   Impression & Recommendations:  Problem # 1:  ANEMIA (ICD-285.9)  Orders: TLB-CBC Platelet - w/Differential (85025-CBCD) CBC will be checked again today.  If his hemoglobin is low he will need further transfusion.  His hemoglobin is not low we will proceed with cardiac catheterization in the near future.  See history of present illness.  Patient has early GI followup arranged.  I've instructed him to increase his AcipHex to b.i.d.  He will also stop Plavix but remain on aspirin.  Problem # 2:  CORONARY ATHEROSCLEROSIS NATIVE CORONARY ARTERY (ICD-414.01)  The following medications were removed from the medication list:    Plavix 75 Mg Tabs (Clopidogrel bisulfate) .Marland Kitchen... Take 1 tablet by mouth once a day His updated medication list for this problem includes:    Imdur 60 Mg Tb24 (  Isosorbide mononitrate) .Marland Kitchen... 1 tab daily    Carvedilol 12.5 Mg Tabs (Carvedilol) .Marland Kitchen... Take one tablet by mouth twice a day    Nitroglycerin 0.4 Mg Subl (Nitroglycerin) .Marland Kitchen... Disolve under tounge as needed    Aspirin 325 Mg Tabs (Aspirin) .Marland Kitchen... 1 by mouth daily I'm concerned about his ongoing chest pain.  Unless he has return of severe anemia will proceed with outpatient cardiac catheterization.  Hemoglobin was checked and is not significantly reduced. Cath will be arranged.  Problem # 3:  DIZZINESS (ICD-780.4) Unfortunately his dizziness continues.  I am not able to assess this further.  Problem # 4:  LEG PAIN, BILATERAL (ICD-729.5) The etiology of his leg pain is not clear.  Up to this point we have found no proof that is vascular in origin.  Problem # 5:  HYPERTENSION (ICD-401.9)  His updated medication list for this problem includes:    Diovan 40 Mg Tabs (Valsartan) .Marland Kitchen... Take 1 tablet by mouth once a day    Carvedilol 12.5 Mg Tabs (Carvedilol) .Marland Kitchen... Take one tablet by mouth twice a day    Aspirin 325 Mg Tabs (Aspirin) .Marland Kitchen... 1 by mouth daily blood pressure is under  reasonable control.  No change in therapy.  Other Orders: TLB-BMP (Basic Metabolic Panel-BMET) (99991111) TLB-PT (Protime) (85610-PTP)  Patient Instructions: 1)  Labs today 2)  We will be in touch with you tomorrow

## 2011-01-07 NOTE — Progress Notes (Signed)
Summary: Metformin Refill  Phone Note Refill Request Message from:  Fax from Pharmacy  Refills Requested: Medication #1:  METFORMIN HCL 1000 MG  TABS Take 1 tablet by mouth two times a day   Dosage confirmed as above?Dosage Confirmed   Brand Name Necessary? No   Supply Requested: 1 month   Last Refilled: 04/15/2010  Method Requested: Electronic Next Appointment Scheduled: June 19, 2010 @ 10a Dr Shawna Orleans Initial call taken by: Jiles Garter CMA,  May 14, 2010 9:22 AM    Prescriptions: METFORMIN HCL 1000 MG  TABS (METFORMIN HCL) Take 1 tablet by mouth two times a day  #60 x 5   Entered by:   Jiles Garter CMA   Authorized by:   D. Drema Pry DO   Signed by:   Jiles Garter CMA on 05/14/2010   Method used:   Electronically to        [phone]* (retail)       [address]       [place], [place]  [address]       Ph: [phone] or [phone]       Fax: [phone]   RxID:   [id number]

## 2011-01-07 NOTE — Letter (Signed)
Summary: Return To Work  [facility] [place]  [address] [place]   [place], [place] [address]   Phone: [phone]  Fax: [phone]    07/21/2010  [facility] [employer] [place] Fax [phone]   RE: ANDREAUS TEZAK [place] [place] #  ss# [id number]  To Whom It May Concern:  Mr Hin has severe heart disease.  This is well documented in all of the records.  He is totally disabled and can not return to work.    The purpose of this letter is to make sure that it is understood that the patient is totally disabled.  In the forms completed under the section Return to Work I clearly outline that the patient can never return to work full-time or part-time.  There seems to be confusion about whether he can return to work in other settings.  The form that I was asked to complete is misleading.  I did not intend to imply that he could return to work when the temperature is less than 75 degrees.  There is no work available at his workplace that could accommodate him medically.    I want to repeat that the patient is permanently and totally disabled.  My records clearly document the severity of his disease and his symptoms.    Sincerely,   Cleatis Polka, MD

## 2011-01-07 NOTE — Progress Notes (Signed)
   Request recieved from [facility], sent to [facility]  July 01, 2010 9:57 AM

## 2011-01-07 NOTE — Progress Notes (Signed)
Summary: Lab result, app clarification  Phone Note Outgoing Call   Summary of Call: call pt - blood work shows pt needs higher dose of thyroid medication. see rx.   needs repeat TSH in 54months  244.90 Initial call taken by: D. Drema Pry DO,  November 27, 2010 3:06 PM  Follow-up for Phone Call        Pt notified per Dr Lora Havens instruction. Pt states he has a f/u with Dr Shawna Orleans on 12/21/09 and is supposed to have his Hgb A1c?  checked 1 week prior to that. Pt wants to know if he can move f/u with you to after his TSH check in February? Also needs to clarify if he is do for another Hgb A1c so soon as it was just checked on 11/25/10?  Please advise. Gilmore Laroche Fergerson CMA Deborra Medina)  November 27, 2010 4:04 PM   Additional Follow-up for Phone Call Additional follow up Details #1::        yes, he can post pone next appt until feb A1c not needed until March Additional Follow-up by: D. Drema Pry DO,  December 08, 2010 1:25 PM    Additional Follow-up for Phone Call Additional follow up Details #2::    Spoke to pt's wife and notified her per Dr Lora Havens recommendation. She states they are traveling home from vacation and reports that pt has been very fatigued since starting the increased dose of Levothyroxine. States pt was seen in ER on New Year's Eve while on vacation and was told he was having rib pain. She reports all blood work was normal at that time. Wants to know if fatigue can be coming from the increase of thyroid med ?  Please advise. Gilmore Laroche Fergerson CMA Deborra Medina)  December 08, 2010 4:51 PM   Additional Follow-up for Phone Call Additional follow up Details #3:: Details for Additional Follow-up Action Taken: unlikely.  [name] DO,  December 08, 2010 5:41 PM  Notified pt. Pt still having episodes of fatigue that requires him to lay down. Offered pt appt today but pt wishes to wait and see Dr Shawna Orleans. Appt scheduled for 12/10/10 @ 2pm.  3 month f/u has been scheduled for 03/02/11 and lab work 1 week prior on  02/24/11. Order entered and faxed to the lab. Gilmore Laroche Fergerson CMA Deborra Medina)  December 09, 2010 8:57 AM   New/Updated Medications: LEVOTHYROXINE SODIUM 75 MCG TABS (LEVOTHYROXINE SODIUM) one by mouth once daily Prescriptions: LEVOTHYROXINE SODIUM 75 MCG TABS (LEVOTHYROXINE SODIUM) one by mouth once daily  #90 x 0   Entered and Authorized by:   D. Drema Pry DO   Signed by:   D. Drema Pry DO on 11/27/2010   Method used:   Electronically to        [phone]* (retail)       [address]       [place], [place]  [address]       Ph: [phone] or [phone]       Fax: [phone]   RxID:   [id number]   Appended Document: Lab result, app clarification Appt reminders mailed to pt for lab work and follow up.

## 2011-01-07 NOTE — Assessment & Plan Note (Signed)
Summary: 2 week f/u - jr   Vital Signs:  Patient profile:   65 year old male Height:      69 inches Weight:      189 pounds BMI:     28.01 Temp:     98.2 degrees F oral Pulse rate:   66 / minute Pulse rhythm:   regular Resp:     16 per minute BP sitting:   100 / 60  (right arm) Cuff size:   large  Vitals Entered By: Jiles Garter CMA (March 20, 2010 10:02 AM) CC: Rm 2- 2 Week Follow    Primary Care Provider:  [name] DO  CC:  Rm 2- 2 Week Follow .  History of Present Illness: 65 y/o white male for follow up still getting SOB but somewhat better with bystolic reviewed CPX study  still getting dizzy.  occurs mainly with bending forward  Allergies: 1)  ! Morphine  Past History:  Past Medical History: Diabetes Mellitus Type II - uncontrolled. CAD.....Marland Kitchenstatus post coronary artery bypass graft in 2000, status post cardiac catheterization in 2006,     2009..continued chest pain and shortness of breath despite oral medication adjustments including ranexa.     Catheter November 2009/...DES Stent..11/26/2008 to distal RCA leading to acute marginal.  /  cath..01/26/2010..continued patentcy of grafts and stent to RCA.Marland KitchenMarland KitchenModest progression of distal disease...medical Rx  CABG..2000 Obstructive sleep apnea.  I do not know if it is possible if this is playing any role in his overall general fatigue. Depression.  He is on sertraline for this.   Hyperlipidemia and he is tolerating simvastatin.   Good left ventricular function.  Palpitations.  He had sinus rhythm when he wore his  event recorder.  Renal stones. Rrestless legs. ..possible RBBB Depression / Bipolar  Median sternotomy suture... one is broken... noted on chest x-ray November, 2010... no clinical significance Dizziness Edema Hypertension SOB Spondylosis.Marland KitchenMarland KitchenC5-6,C6-7  MRI...10/15/2009 MRI  Head....chronic microvascular ischemia....10/2009 Anemia..  hemoglobin 7.4.Marland KitchenMarland KitchenMarland KitchenMarland Kitchen iron deficiency... January, 2011.... endoscopy  normal....2 unit transfusion in hospital  /  capsule endoscopy..February, 2011.... no small bowel abnormalities.... most likely source of bleeding was the gastric erosions..... meds adjusted.... patient to see Dr.Patterson soon    Past Surgical History: Nasal Septum repair Coronary artery bypass graft 2000  percutaneous stenting of the distal right  coronary artery 11/2008           Family History: Mother deceased at age 41, has history of heart disease, died during coronary artery bypass graft.  Father deceased at age 90 from unknown arrhythmia.  The patient has 4 brothers, all have diabetes, 1 has a history of cerebrovascular accident and 1 has prostate cancer.    One of his brothers recently diagnosed to pancreatic cancer. Brother with recent diagnosis of CAD and CABG sheduled. Nephew with cardiac arrest  Family History of Prostate Cancer:brother            Social History: The patient is married, has 4 daughters, 1 daughter lives with the patient.  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    Never Smoked   Alcohol use-no    Illicit Drug Use - no     Daily Caffeine Use coffee in the AM   i Physical Exam  General:  alert, well-developed, and well-nourished.   Neck:  supple and no masses.   Lungs:  normal respiratory effort and normal breath sounds.   Heart:  normal rate, regular rhythm, and no gallop.  Extremities:  No lower extremity edema    Impression & Recommendations:  Problem # 1:  SHORTNESS OF BREATH (ICD-786.05) Assessment Improved less dyspnea since treatment for  anemia / GI blood loss   Problem # 2:  HYPERTENSION (ICD-401.9) tolerating bystolic.  I am hopeful that switch to bystolic will help with exercise tolerance  His updated medication list for this problem includes:    Diovan 40 Mg Tabs (Valsartan) .Marland Kitchen... Take 1 tablet by mouth once a day    Bystolic 10 Mg Tabs (Nebivolol hcl) .Marland Kitchen... Take 1  tab daily as directed  BP today:  100/60 Prior BP: 110/60 (03/06/2010)  Labs Reviewed: K+: 4.7 (03/06/2010) Creat: : 1.03 (03/06/2010)   Chol: 97 (06/05/2009)   HDL: 31.80 (06/05/2009)   LDL: 57 (06/05/2009)   TG: 42.0 (06/05/2009)  Problem # 3:  DIZZINESS (ICD-780.4) pt dizziness always assoc with bending forward.  consider baroreceptor hypersensitivity  Complete Medication List: 1)  Glimepiride 2 Mg Tabs (Glimepiride) .... One by mouth once daily 2)  Lantus Solostar 100 Unit/ml Soln (Insulin glargine) .... Inject 28 - 35 units daily 3)  Metformin Hcl 1000 Mg Tabs (Metformin hcl) .... Take 1 tablet by mouth two times a day 4)  Simvastatin 40 Mg Tabs (Simvastatin) .... Take 1 tablet by mouth at bedtime 5)  Gabapentin 300 Mg Caps (Gabapentin) .Marland Kitchen.. 1 by mouth two times a day 6)  Diovan 40 Mg Tabs (Valsartan) .... Take 1 tablet by mouth once a day 7)  Sertraline Hcl 100 Mg Tabs (Sertraline hcl) .... Take 1 tablet by mouth two times a day 8)  Imdur 60 Mg Tb24 (Isosorbide mononitrate) .Marland Kitchen.. 1 tab daily 9)  Fish Oil 1000 Mg Caps (Omega-3 fatty acids) .... Take two gel caps two times a day 10)  Nitroglycerin 0.4 Mg Subl (Nitroglycerin) .... Disolve under tounge as needed 11)  Voltaren 1 % Gel (Diclofenac sodium) .... Apply 2 grams qid 12)  Pramipexole Dihydrochloride 0.25 Mg Tabs (Pramipexole dihydrochloride) .... One by mouth qhs 13)  Vicodin 5-500 Mg Tabs (Hydrocodone-acetaminophen) .... Take 1 tablet by mouth two times a day as needed 14)  Aspirin 325 Mg Tabs (Aspirin) .Marland Kitchen.. 1 by mouth daily 15)  Novolog Penfill 100 Unit/ml Soln (Insulin aspart) .... Inject 1-7 units subcutaneously sliding scale as needed 16)  Triamcinolone Acetonide 0.1 % Oint (Triamcinolone acetonide) .... Apply two times a day to dry hands 17)  Ferrous Sulfate 325 (65 Fe) Mg Tabs (Ferrous sulfate) .... Two times a day 18)  Aciphex 20 Mg Tbec (Rabeprazole sodium) .... Take 1 tablet by mouth two times a day 19)  Novofine 30g X 8 Mm Misc (Insulin pen needle)  .... Use for insulin injection three times a day 20)  Carafate 1 Gm/21ml Susp (Sucralfate) .... One tbsp qid 21)  Plavix 75 Mg Tabs (Clopidogrel bisulfate) .... Take 1 tablet by mouth once a day 22)  Bystolic 10 Mg Tabs (Nebivolol hcl) .... Take 1  tab daily as directed  Patient Instructions: 1)  Please schedule a follow-up appointment in 3 months. 2)  BMP prior to visit, ICD-9:  401.9 3)  HbgA1C prior to visit, ICD-9: 250.00 4)  Please return for lab work one (1) week before your next appointment.  Prescriptions: BYSTOLIC 10 MG TABS (NEBIVOLOL HCL) take 1  tab daily as directed  #30 x 5   Entered and Authorized by:   D. Drema Pry DO   Signed by:   D. Drema Pry DO on 03/20/2010  Method used:   Electronically to        [facility] (retail)       [address]       [place], [place]  [address]       Ph: [phone] or [phone]       Fax: [phone]   RxID:   [id number]   Current Allergies (reviewed today): ! MORPHINE

## 2011-01-07 NOTE — Progress Notes (Signed)
Summary: Question about results   Phone Note Call from Patient Call back at [phone]   Call For: Dr Sharlett Iles Summary of Call: Has a question for nurse about hi sresult given to him. Initial call taken by: Irwin Brakeman [facility],  January 14, 2010 3:24 PM  Follow-up for Phone Call        Pt asks where the ulcers were located.  Pt notified gastric ulcers.   Follow-up by: Alberteen Spindle RN,  January 14, 2010 3:47 PM

## 2011-01-07 NOTE — Assessment & Plan Note (Signed)
Summary: 2 month fu/dt   Vital Signs:  Patient profile:   65 year old male Weight:      188.50 pounds BMI:     27.94 O2 Sat:      98 % on Room air Temp:     98.0 degrees F oral Pulse rate:   66 / minute Pulse rhythm:   regular Resp:     18 per minute BP sitting:   122 / 80  (left arm) Cuff size:   large  Vitals Entered By: Jiles Garter CMA (August 28, 2010 10:22 AM)  O2 Flow:  Room air CC: 2 Month Follow up Is Patient Diabetic? Yes Did you bring your meter with you today? No Pain Assessment Patient in pain? no      Comments 2 month follow up, has not been monitoring blood sugars, no refills of medication needed, flu shot to be given with employer   Primary Care Provider:  D. Drema Pry DO  CC:  2 Month Follow up.  History of Present Illness: 65 y/o white male with CAD, DM II uncontrolled, and [place] gets confused " when I pick my foot up, I'm not sure where to put my foot down"  not checking blood sugar  moved to smaller apt "downsided"  chest pains - better since using inhaler  DM II - poor dietary compliance  Current Diet: Breakfast: go to dinner - eggs, bacon, potatoe, pan cakes Lunch:  hamburger, chicken nuggests DInner:  apple bees,  steak and potatoes Snacks: coffee and water  Beverage: unsweet tea   Preventive Screening-Counseling & Management  Alcohol-Tobacco     Smoking Status: never  Allergies: 1)  ! Morphine  Past History:  Past Medical History: Diabetes Mellitus Type II - uncontrolled. CAD.....Marland Kitchenstatus post coronary artery bypass graft in 2000, status post cardiac catheterization in 2006,     2009..continued chest pain and shortness of breath despite oral medication adjustments including ranexa.     Catheter November 2009/...DES Stent..11/26/2008 to distal RCA leading to acute marginal.  /  cath..01/26/2010..continued patentcy of grafts and stent to RCA.Marland KitchenMarland KitchenModest progression of distal  disease...medical Rx  /  [facility]  Apr 23, 2010... no MI... home.. Diovan stopped and into her dose reduced for relative low blood pressure CABG..2000  Obstructive sleep apnea.  I do not know if it is possible if this is playing any role in his overall general fatigue. Depression.  He is on sertraline for this.   Hyperlipidemia and he is tolerating simvastatin.   Good left ventricular function.   Palpitations.  He had sinus rhythm when he wore his  event recorder. Renal stones. Rrestless legs. ..on dopamine agonist. RBBB Depression / Bipolar  Median sternotomy suture... one is broken... noted on chest x-ray November, 2010... no clinical significance Dizziness Edema Hypertension SOB....cardiopulmonary exercise test... March 12, 2010... mild functional limitation.... no clear pulmonary or cardiac limitation.... possible deconditioning and mild chronotropic incompetence...( peak heart rate 130) Spondylosis.Marland KitchenMarland KitchenC5-6,C6-7  MRI...10/15/2009 MRI  Head....chronic microvascular ischemia....10/2009 Anemia..  hemoglobin 7.4.Marland KitchenMarland KitchenMarland KitchenMarland Kitchen iron deficiency... January, 2011.... endoscopy normal....2 unit transfusion in hospital  /  capsule endoscopy..February, 2011.... no small bowel abnormalities.... most likely source of bleeding was the gastric erosions..... meds adjusted.... patient to see Dr.Patterson soon Recommendation for disability   ... May 21, 2010 Falling spells    these have occurred in the past and are again recurring 2011     Past Surgical History: Nasal Septum repair- UP3 Coronary  artery bypass graft 2000  percutaneous stenting of the distal right  coronary artery 11/2008             Family History: Mother deceased at age 51, has history of heart disease, died during coronary artery bypass graft.  Father deceased at age 27 from unknown arrhythmia.  The patient has 4 brothers, all have diabetes, 1 has a history of cerebrovascular accident and 1 has prostate cancer.    One of his brothers recently  diagnosed to pancreatic cancer. Brother with recent diagnosis of CAD and CABG sheduled. Nephew with cardiac arrest  Family History of Prostate Cancer:brother    Brother - hypothyroidism    Physical Exam  General:  alert, well-developed, and well-nourished.   Lungs:  normal respiratory effort, normal breath sounds, no crackles, and no wheezes.   Heart:  normal rate, regular rhythm, and no gallop.   Neurologic:  cranial nerves II-XII intact and gait normal.     Impression & Recommendations:  Problem # 1:  DIZZINESS (ICD-780.4) unclear etiology of dizziness and intermittent confusion pt advised to monitor blood sugars more closely pt on higher dose of SSRI - question medication side effect trial of tapering dose slowly  Problem # 2:  DIABETES MELLITUS, TYPE II (ICD-250.00) stress importance of dietary compliance.  pt to monitor of hypoglycemia after he adjusts his diet.  pt understands to decrease lantus dose and  adjust novolog dose according to carb intake  His updated medication list for this problem includes:    Glimepiride 2 Mg Tabs (Glimepiride) ..... One by mouth once daily    Lantus Solostar 100 Unit/ml Soln (Insulin glargine) ..... Inject 28 - 35 units daily    Metformin Hcl 1000 Mg Tabs (Metformin hcl) .Marland Kitchen... Take 1/2 tablet by mouth two times a day    Aspirin 81 Mg Tbec (Aspirin) .Marland Kitchen... Take one tablet by mouth daily    Novolog Penfill 100 Unit/ml Soln (Insulin aspart) ..... Inject 1-7 units subcutaneously sliding scale as needed  Labs Reviewed: Creat: 1.08 (08/20/2010)     Last Eye Exam: normal (06/16/2010) Reviewed HgBA1c results: 9.1 (08/20/2010)  7.7 (06/24/2010)  Problem # 3:  HYPERTENSION (ICD-401.9) Assessment: Unchanged  His updated medication list for this problem includes:    Bystolic 5 Mg Tabs (Nebivolol hcl) .Marland Kitchen... Take 1 tablet by mouth once a day  BP today: 122/80 Prior BP: 121/70 (08/11/2010)  Labs Reviewed: K+: 4.4 (08/20/2010) Creat: : 1.08  (08/20/2010)   Chol: 88 (06/24/2010)   HDL: 30 (06/24/2010)   LDL: 40 (06/24/2010)   TG: 90 (06/24/2010)  Complete Medication List: 1)  Glimepiride 2 Mg Tabs (Glimepiride) .... One by mouth once daily 2)  Lantus Solostar 100 Unit/ml Soln (Insulin glargine) .... Inject 28 - 35 units daily 3)  Metformin Hcl 1000 Mg Tabs (Metformin hcl) .... Take 1/2 tablet by mouth two times a day 4)  Simvastatin 40 Mg Tabs (Simvastatin) .... Take 1 tablet by mouth at bedtime 5)  Gabapentin 300 Mg Caps (Gabapentin) .Marland Kitchen.. 1 by mouth by mouth qpm 6)  Sertraline Hcl 100 Mg Tabs (Sertraline hcl) .... One by mouth two times a day 7)  Imdur 60 Mg Tb24 (Isosorbide mononitrate) .Marland Kitchen.. 1 tablet by mouth once daily 8)  Fish Oil 1000 Mg Caps (Omega-3 fatty acids) .... Take two gel caps two times a day 9)  Nitrostat 0.4 Mg Subl (Nitroglycerin) .Marland Kitchen.. 1 tablet under tongue at onset of chest pain; you may repeat every 5 minutes for up to 3  doses. 10)  Voltaren 1 % Gel (Diclofenac sodium) .... Apply 2 grams qid as needed 11)  Pramipexole Dihydrochloride 0.25 Mg Tabs (Pramipexole dihydrochloride) .... One by mouth qhs 12)  Aspirin 81 Mg Tbec (Aspirin) .... Take one tablet by mouth daily 13)  Novolog Penfill 100 Unit/ml Soln (Insulin aspart) .... Inject 1-7 units subcutaneously sliding scale as needed 14)  Triamcinolone Acetonide 0.1 % Oint (Triamcinolone acetonide) .... Apply two times a day to dry hands 15)  Ferrous Sulfate 325 (65 Fe) Mg Tabs (Ferrous sulfate) .... Two times a day 16)  Pantoprazole Sodium 40 Mg Tbec (Pantoprazole sodium) .Marland Kitchen.. 1 by mouth once daily 17)  Novofine 30g X 8 Mm Misc (Insulin pen needle) .... Use for insulin injection three times a day 18)  Carafate 1 Gm/77ml Susp (Sucralfate) .... One tbsp qid 19)  Plavix 75 Mg Tabs (Clopidogrel bisulfate) .... Take 1 tablet by mouth once a day 20)  Bystolic 5 Mg Tabs (Nebivolol hcl) .... Take 1 tablet by mouth once a day 21)  Levothyroxine Sodium 50 Mcg Tabs  (Levothyroxine sodium) .... One by mouth once daily 22)  Qvar 40 Mcg/act Aers (Beclomethasone dipropionate) .... 2 puffs two times a day 23)  Freestyle Lite Test Strp (Glucose blood) .... Use to test blood test blood sugar three times a day  Patient Instructions: 1)  Please schedule a follow-up appointment in 1 month.  Current Allergies (reviewed today): ! MORPHINE

## 2011-01-07 NOTE — Cardiovascular Report (Signed)
Summary: CDC Pre-Cath Orders  CDC Pre-Cath Orders   Imported By: Jamelle Haring 01/26/2010 11:21:55  _____________________________________________________________________  External Attachment:    Type:   Image     Comment:   External Document

## 2011-01-07 NOTE — Letter (Signed)
Summary: [employer] - Long Term Disability Claim  [employer] - Long Term Disability Claim   Imported By: Marilynne Drivers 10/26/2010 09:21:26  _____________________________________________________________________  External Attachment:    Type:   Image     Comment:   External Document

## 2011-01-07 NOTE — Procedures (Signed)
Summary: Capsule Endoscopy   Capsule Endoscopy  Procedure date:  01/07/2010  Findings:      Performing Location: [facility]   Ordering Physician: Verl Blalock, MD  Report created/read [id number] Carlean Purl, MD  Reason for Referral: 65 y/o male on ASA and Plavix with recent admission for symptomatic anemia, and iron deficiency.  EGD on 12/26/09 showed gastritis.  Last colonoscopy 3/10 shower internal hemorrhoids only.  Procedure Information and Findings:  1) Complete study, one segment with poor visulalization from 3 hours to 4 hours.  2) three gastric ulcers and several gastric erosions 3) no small bowel abnormality noted.  Distal terminal ileum not seen well.  Summary and Recommendations:  As above.  Looks like gastritis which was seen on EGD.  No small bowel abnormality with limited views of distal terminal ileum.  Plans per Dr, Sharlett Iles.  This report was created from the original report, which was reviewed and signed by the above listed reading physician.   Appended Document: Capsule Endoscopy PLEASE SEE ME...DRP  Appended Document: Capsule Endoscopy Appt scheduled.

## 2011-01-07 NOTE — Assessment & Plan Note (Signed)
Summary: 1 MONTH FOLLOW UP/MHF   Vital Signs:  Patient profile:   65 year old male Height:      69 inches Weight:      186.50 pounds BMI:     27.64 O2 Sat:      98 % on Room air Temp:     98.2 degrees F oral Pulse rate:   72 / minute Pulse rhythm:   regular Resp:     18 per minute BP sitting:   110 / 60  (right arm) Cuff size:   regular  Vitals Entered By: Jiles Garter CMA (March 06, 2010 9:09 AM)  O2 Flow:  Room air CC: Rm 3- 1 Month Follow up Comments discuss follow up from Heart test, c/o daily headaches, low blood sugar 84 high 270, avg 140-elevation is usually in the afternoon   Primary Care Provider:  DDrema Pry DO  CC:  Rm 3- 1 Month Follow up.  History of Present Illness: 65 y/o white male with hx of DM II, CAD and htn for follow up reviewed hosp, cardiology and GI workup anemia improved cath results reviewed pt still having issues with chest discomfort and dypspnea  occ headaches.  neck tension - occiptal area  still getting dizzy with bending forward  DM II - stable.  Allergies: 1)  ! Morphine  Past History:  Past Medical History: Diabetes Mellitus Type II - uncontrolled. CAD.....Marland Kitchenstatus post coronary artery bypass graft in 2000, status post cardiac catheterization in 2006,     2009..continued chest pain and shortness of breath despite oral medication adjustments including ranexa.     Catheter November 2009/...DES Stent..11/26/2008 to distal RCA leading to acute marginal.  /  cath..01/26/2010..continued patentcy of grafts and stent to RCA.Marland KitchenMarland KitchenModest progression of distal disease...medical Rx  CABG..2000 Obstructive sleep apnea.  I do not know if it is possible if this is playing any role in his overall general fatigue. Depression.  He is on sertraline for this.   Hyperlipidemia and he is tolerating simvastatin.  Good left ventricular function.  Palpitations.  He had sinus rhythm when he wore his  event recorder.  Renal stones. Rrestless legs.  ..possible RBBB Depression / Bipolar  Median sternotomy suture... one is broken... noted on chest x-ray November, 2010... no clinical significance Dizziness Edema Hypertension SOB Spondylosis.Marland KitchenMarland KitchenC5-6,C6-7  MRI...10/15/2009 MRI  Head....chronic microvascular ischemia....10/2009 Anemia..  hemoglobin 7.4.Marland KitchenMarland KitchenMarland KitchenMarland Kitchen iron deficiency... January, 2011.... endoscopy normal....2 unit transfusion in hospital  /  capsule endoscopy..February, 2011.... no small bowel abnormalities.... most likely source of bleeding was the gastric erosions..... meds adjusted.... patient to see Dr.Patterson soon    Past Surgical History: Nasal Septum repair Coronary artery bypass graft 2000  percutaneous stenting of the distal right  coronary artery 11/2008          Family History: Mother deceased at age 37, has history of heart disease, died during coronary artery bypass graft.  Father deceased at age 58 from unknown arrhythmia.  The patient has 4 brothers, all have diabetes, 1 has a history of cerebrovascular accident and 1 has prostate cancer.    One of his brothers recently diagnosed to pancreatic cancer. Brother with recent diagnosis of CAD and CABG sheduled. Nephew with cardiac arrest  Family History of Prostate Cancer:brother           Social History: The patient is married, has 4 daughters, 1 daughter lives with the patient.  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    Never Smoked  Alcohol use-no   Illicit Drug Use - no     Daily Caffeine Use coffee in the AM    Physical Exam  General:  alert, well-developed, and well-nourished.   Head:  normocephalic and atraumatic.   Neck:  increased in muscle tone of c spine paraspinal muscles Lungs:  normal respiratory effort and normal breath sounds.   Heart:  normal rate, regular rhythm, and no gallop.   Abdomen:  soft, non-tender, and normal bowel sounds.   Extremities:  No lower extremity edema    Impression &  Recommendations:  Problem # 1:  HEADACHE (ICD-784.0) Chronic tension headaches.  neck exercises reviewed.  avoid muscle relaxers due to somnolence  The following medications were removed from the medication list:    Carvedilol 12.5 Mg Tabs (Carvedilol) .Marland Kitchen... Take one tablet by mouth twice a day His updated medication list for this problem includes:    Vicodin 5-500 Mg Tabs (Hydrocodone-acetaminophen) .Marland Kitchen... Take 1 tablet by mouth two times a day as needed    Aspirin 325 Mg Tabs (Aspirin) .Marland Kitchen... 1 by mouth daily    Bystolic 10 Mg Tabs (Nebivolol hcl) .Marland Kitchen... Take 1/2 tab daily as directed  Problem # 2:  CORONARY ARTERY DISEASE (ICD-414.00) Pt having persistent shortness of breath, fatigue and chest discomfort.  Cardiac cath results reviewed.  Fatigue and sob may be related to non selective b blocker .  switch to bystolic.  The following medications were removed from the medication list:    Carvedilol 12.5 Mg Tabs (Carvedilol) .Marland Kitchen... Take one tablet by mouth twice a day His updated medication list for this problem includes:    Diovan 40 Mg Tabs (Valsartan) .Marland Kitchen... Take 1 tablet by mouth once a day    Imdur 60 Mg Tb24 (Isosorbide mononitrate) .Marland Kitchen... 1 tab daily    Nitroglycerin 0.4 Mg Subl (Nitroglycerin) .Marland Kitchen... Disolve under tounge as needed    Aspirin 325 Mg Tabs (Aspirin) .Marland Kitchen... 1 by mouth daily    Plavix 75 Mg Tabs (Clopidogrel bisulfate) .Marland Kitchen... Take 1 tablet by mouth once a day    Bystolic 10 Mg Tabs (Nebivolol hcl) .Marland Kitchen... Take 1/2 tab daily as directed  Problem # 3:  DIABETES MELLITUS, TYPE II (ICD-250.00) Assessment: Unchanged He denies hypoglycemia.  Maintain current medication regimen.  His updated medication list for this problem includes:    Glimepiride 2 Mg Tabs (Glimepiride) ..... One by mouth once daily    Lantus Solostar 100 Unit/ml Soln (Insulin glargine) ..... Inject 28 - 35 units daily    Metformin Hcl 1000 Mg Tabs (Metformin hcl) .Marland Kitchen... Take 1 tablet by mouth two times a day     Diovan 40 Mg Tabs (Valsartan) .Marland Kitchen... Take 1 tablet by mouth once a day    Aspirin 325 Mg Tabs (Aspirin) .Marland Kitchen... 1 by mouth daily    Novolog Penfill 100 Unit/ml Soln (Insulin aspart) ..... Inject 1-7 units subcutaneously sliding scale as needed  Orders: T- Hemoglobin A1C [id number])  Labs Reviewed: Creat: 1.1 (01/22/2010)    Reviewed HgBA1c results: 7.9 (11/14/2009)  8.1 (09/12/2009)  Complete Medication List: 1)  Glimepiride 2 Mg Tabs (Glimepiride) .... One by mouth once daily 2)  Lantus Solostar 100 Unit/ml Soln (Insulin glargine) .... Inject 28 - 35 units daily 3)  Metformin Hcl 1000 Mg Tabs (Metformin hcl) .... Take 1 tablet by mouth two times a day 4)  Simvastatin 40 Mg Tabs (Simvastatin) .... Take 1 tablet by mouth at bedtime 5)  Gabapentin 300 Mg Caps (Gabapentin) .Marland Kitchen.. 1 by mouth  two times a day 6)  Diovan 40 Mg Tabs (Valsartan) .... Take 1 tablet by mouth once a day 7)  Sertraline Hcl 100 Mg Tabs (Sertraline hcl) .... Take 1 tablet by mouth two times a day 8)  Imdur 60 Mg Tb24 (Isosorbide mononitrate) .Marland Kitchen.. 1 tab daily 9)  Fish Oil 1000 Mg Caps (Omega-3 fatty acids) .... Take two gel caps two times a day 10)  Nitroglycerin 0.4 Mg Subl (Nitroglycerin) .... Disolve under tounge as needed 11)  Voltaren 1 % Gel (Diclofenac sodium) .... Apply 2 grams qid 12)  Pramipexole Dihydrochloride 0.25 Mg Tabs (Pramipexole dihydrochloride) .... One by mouth qhs 13)  Vicodin 5-500 Mg Tabs (Hydrocodone-acetaminophen) .... Take 1 tablet by mouth two times a day as needed 14)  Aspirin 325 Mg Tabs (Aspirin) .Marland Kitchen.. 1 by mouth daily 15)  Novolog Penfill 100 Unit/ml Soln (Insulin aspart) .... Inject 1-7 units subcutaneously sliding scale as needed 16)  Triamcinolone Acetonide 0.1 % Oint (Triamcinolone acetonide) .... Apply two times a day to dry hands 17)  Ferrous Sulfate 325 (65 Fe) Mg Tabs (Ferrous sulfate) .... Two times a day 18)  Aciphex 20 Mg Tbec (Rabeprazole sodium) .... Take 1 tablet by mouth two  times a day 19)  Novofine 30g X 8 Mm Misc (Insulin pen needle) .... Use for insulin injection three times a day 20)  Carafate 1 Gm/43ml Susp (Sucralfate) .... One tbsp qid 21)  Plavix 75 Mg Tabs (Clopidogrel bisulfate) .... Take 1 tablet by mouth once a day 22)  Bystolic 10 Mg Tabs (Nebivolol hcl) .... Take 1/2 tab daily as directed  Other Orders: T-Basic Metabolic Panel ([id number])  Patient Instructions: 1)  Please schedule a follow-up appointment in 2 weeks. 2)  Monitor your blood pressure at home 3)  If SBP is greater than 130, take 10 mg of bystolic. Prescriptions: GLIMEPIRIDE 2 MG TABS (GLIMEPIRIDE) one by mouth once daily  #30 x 5   Entered by:   Jiles Garter CMA   Authorized by:   D. Drema Pry DO   Signed by:   Jiles Garter CMA on 03/06/2010   Method used:   Electronically to        [facility] [phone]* (retail)       [address]       [place], [place]  [address]       Ph: [phone] or [phone]       Fax: [phone]   RxID:   [id number]   Current Allergies (reviewed today): ! MORPHINE

## 2011-01-07 NOTE — Assessment & Plan Note (Signed)
Summary: rov      Allergies Added:   Visit Type:  Follow-up Referring Provider:  Verl Blalock Primary Provider:  [name] DO  CC:   chest pain.  History of Present Illness:  The patient is here for followup of chest pain.  Unfortunately he is doing poorly.  As the records show, I have seen him on a very regular basis both in the hospital and out of the hospital.  I've arranged for multiple tests.  He continues to be bothered by chest pain, shortness of breath, and presyncope.  He has severe coronary disease.  His disease his distal and this does not make him a good candidate at this time for bypass surgery.  Current Medications (verified): 1)  Glimepiride 2 Mg Tabs (Glimepiride) .... One By Mouth Once Daily 2)  Lantus Solostar 100 Unit/ml Soln (Insulin Glargine) .... Inject 28 - 35 Units Daily 3)  Metformin Hcl 1000 Mg  Tabs (Metformin Hcl) .... Take 1 Tablet By Mouth Two Times A Day 4)  Simvastatin 40 Mg  Tabs (Simvastatin) .... Take 1 Tablet By Mouth At Bedtime 5)  Gabapentin 300 Mg  Caps (Gabapentin) .Marland Kitchen.. 1 By Mouth Two Times A Day 6)  Sertraline Hcl 100 Mg  Tabs (Sertraline Hcl) .... Take 1 Tablet By Mouth Two Times A Day 7)  Imdur 60 Mg  Tb24 (Isosorbide Mononitrate) .... 1/2 Tablet By Mouth Once Daily 8)  Fish Oil 1000 Mg Caps (Omega-3 Fatty Acids) .... Take Two Gel Caps Two Times A Day 9)  Nitroglycerin 0.4 Mg Subl (Nitroglycerin) .... Disolve Under Tounge As Needed 10)  Voltaren 1 % Gel (Diclofenac Sodium) .... Apply 2 Grams Qid 11)  Pramipexole Dihydrochloride 0.25 Mg Tabs (Pramipexole Dihydrochloride) .... One By Mouth Qhs 12)  Aspirin 81 Mg Tbec (Aspirin) .... Take One Tablet By Mouth Daily 13)  Novolog Penfill 100 Unit/ml Soln (Insulin Aspart) .... Inject 1-7 Units Subcutaneously Sliding Scale As Needed 14)  Triamcinolone Acetonide 0.1 % Oint (Triamcinolone Acetonide) .... Apply Two Times A Day To Dry Hands 15)  Ferrous Sulfate 325 (65 Fe) Mg  Tabs (Ferrous Sulfate)  .... Two Times A Day 16)  Pantoprazole Sodium 40 Mg Tbec (Pantoprazole Sodium) .Marland Kitchen.. 1 By Mouth Once Daily 17)  Novofine 30g X 8 Mm Misc (Insulin Pen Needle) .... Use For Insulin Injection Three Times A Day 18)  Carafate 1 Gm/23ml  Susp (Sucralfate) .... One Tbsp Qid 19)  Plavix 75 Mg Tabs (Clopidogrel Bisulfate) .... Take 1 Tablet By Mouth Once A Day 20)  Bystolic 10 Mg Tabs (Nebivolol Hcl) .... Take 1  Tab Daily As Directed  Allergies (verified): 1)  ! Morphine  Past History:  Past Medical History: Diabetes Mellitus Type II - uncontrolled. CAD.....Marland Kitchenstatus post coronary artery bypass graft in 2000, status post cardiac catheterization in 2006,     2009..continued chest pain and shortness of breath despite oral medication adjustments including ranexa.     Catheter November 2009/...DES Stent..11/26/2008 to distal RCA leading to acute marginal.  /  cath..01/26/2010..continued patentcy of grafts and stent to RCA.Marland KitchenMarland KitchenModest progression of distal disease...medical Rx  /  [facility]  Apr 23, 2010... no MI... home.. Diovan stopped and into her dose reduced for relative low blood pressure CABG..2000 Obstructive sleep apnea.  I do not know if it is possible if this is playing any role in his overall general fatigue. Depression.  He is on sertraline for this.   Hyperlipidemia and he is tolerating simvastatin.   Good left  ventricular function.  Palpitations.  He had sinus rhythm when he wore his  event recorder. Renal stones. Rrestless legs. ..possible RBBB Depression / Bipolar  Median sternotomy suture... one is broken... noted on chest x-ray November, 2010... no clinical significance Dizziness Edema Hypertension SOB....cardiopulmonary exercise test... March 12, 2010... mild functional limitation.... no clear pulmonary or cardiac limitation.... possible deconditioning and mild chronotropic incompetence...( peak heart rate 130) Spondylosis.Marland KitchenMarland KitchenC5-6,C6-7  MRI...10/15/2009 MRI  Head....chronic microvascular  ischemia....10/2009 Anemia..  hemoglobin 7.4.Marland KitchenMarland KitchenMarland KitchenMarland Kitchen iron deficiency... January, 2011.... endoscopy normal....2 unit transfusion in hospital  /  capsule endoscopy..February, 2011.... no small bowel abnormalities.... most likely source of bleeding was the gastric erosions..... meds adjusted.... patient to see Dr.Patterson soon Recommendation for disability   ... May 21, 2010     Review of Systems       Patient denies fever, chills.  He does have repeated headaches.  Is not having the sweats.  There is no change in vision or hearing.  He has regular chest pain.  There is no definite cough.  He has no nausea or vomiting or urinary symptoms.  All other systems are reviewed and are negative.  Vital Signs:  Patient profile:   65 year old male Height:      69 inches Weight:      189 pounds BMI:     28.01 Pulse rate:   71 / minute BP sitting:   118 / 57  (left arm) Cuff size:   regular  Vitals Entered By: Lubertha Basque, CNA (May 21, 2010 2:45 PM)  Physical Exam  General:  patient looks fatigued. Eyes:  no xanthelasma. Neck:  no jugular venous distention. Lungs:  lungs are clear respiratory effort is nonlabored. Heart:  cardiac exam reveals S1-S2.  No clicks or significant murmurs. Abdomen:  abdomen is soft. Extremities:  no peripheral edema. Psych:  patient is oriented to person time and place.  Affect is normal.   Impression & Recommendations:  Problem # 1:  CORONARY ATHEROSCLEROSIS NATIVE CORONARY ARTERY (ICD-414.01)  His updated medication list for this problem includes:    Imdur 60 Mg Tb24 (Isosorbide mononitrate) .Marland Kitchen... 1/2 tablet by mouth once daily    Nitroglycerin 0.4 Mg Subl (Nitroglycerin) .Marland Kitchen... Disolve under tounge as needed    Aspirin 81 Mg Tbec (Aspirin) .Marland Kitchen... Take one tablet by mouth daily    Plavix 75 Mg Tabs (Clopidogrel bisulfate) .Marland Kitchen... Take 1 tablet by mouth once a day    Bystolic 10 Mg Tabs (Nebivolol hcl) .Marland Kitchen... Take 1  tab daily as directed The patient has  ongoing angina and does not respond to medications.  He has significant distal disease.  He is not a candidate at this time for intervention to his proximal coronaries.  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  I feel strongly that he is disabled for this kind of work.  I have recommended disability to him.  Over the years he has been very hesitant about this possibility as he wants to continue to work.  I believe that he is not able to work.  Problem # 2:  SHORTNESS OF BREATH (ICD-786.05)  His updated medication list for this problem includes:    Aspirin 81 Mg Tbec (Aspirin) .Marland Kitchen... Take one tablet by mouth daily    Bystolic 10 Mg Tabs (Nebivolol hcl) .Marland Kitchen... Take 1  tab daily as directed Shortness of breath is significant.  He is not volume overloaded.  Problem # 3:  PALPITATIONS (ICD-785.1)  His updated medication list for  this problem includes:    Imdur 60 Mg Tb24 (Isosorbide mononitrate) .Marland Kitchen... 1/2 tablet by mouth once daily    Nitroglycerin 0.4 Mg Subl (Nitroglycerin) .Marland Kitchen... Disolve under tounge as needed    Aspirin 81 Mg Tbec (Aspirin) .Marland Kitchen... Take one tablet by mouth daily    Plavix 75 Mg Tabs (Clopidogrel bisulfate) .Marland Kitchen... Take 1 tablet by mouth once a day    Bystolic 10 Mg Tabs (Nebivolol hcl) .Marland Kitchen... Take 1  tab daily as directed palpitations continued.  We know that he does not have any dangerous arrhythmias.  Patient Instructions: 1)  We have provided you a note to be out of work when you get your disability forms drop them off and we will complete them 2)  Follow up in 3 months

## 2011-01-07 NOTE — Progress Notes (Signed)
  Phone Note Other Incoming   Summary of Call: Patient's chart has been requested for [facility]-request forwarded to [facility] on 01/05/10.

## 2011-01-07 NOTE — Progress Notes (Signed)
Summary: Lab  Results  Phone Note Call from Patient Call back at [phone]   Caller: Patient Call For: D. Drema Pry DO Reason for Call: Lab or Test Results Complaint: Earache/Ear Infection Summary of Call: Patient called requesting results of lab test. He is scheduled for a follow up appointment on 03/20/2010. He also wanted Dr. Shawna Orleans to be aware that his wife is home and she is doing well. Initial call taken by: Jiles Garter CMA,  March 13, 2010 11:59 AM  Follow-up for Phone Call        A1c is 7.8.  electrolytes and kidney function stable Follow-up by: D. Drema Pry DO,  March 15, 2010 8:01 AM  Additional Follow-up for Phone Call Additional follow up Details #1::        call placed to patient at [phone], patient not available, wife Santiago Glad informed of results Additional Follow-up by: Jiles Garter CMA,  March 16, 2010 9:27 AM

## 2011-01-07 NOTE — Progress Notes (Signed)
Summary: lantus refill  Phone Note Refill Request Message from:  Fax from [facility] on June 14, 2010 12:44 PM  Refills Requested: Medication #1:  LANTUS SOLOSTAR 100 UNIT/ML SOLN Inject 28 - 35 units daily   Dosage confirmed as above?Dosage Confirmed   Supply Requested: 1 month   Last Refilled: 05/06/2010 Next Appointment Scheduled: 06-19-10  Dr. Shawna Orleans Initial call taken by: Kelle Darting CMA Deborra Medina),  June 15, 2010 3:33 PM    Prescriptions: LANTUS SOLOSTAR 100 UNIT/ML SOLN (INSULIN GLARGINE) Inject 28 - 35 units daily  #15 Each x 2   Entered by:   Kelle Darting CMA ([place])   Authorized by:   D. Drema Pry DO   Signed by:   Kelle Darting CMA ([place]) on 06/15/2010   Method used:   Electronically to        [facility] [phone]* (retail)       [address]       [place], [place]  [address]       Ph: [phone] or [phone]       Fax: [phone]   RxID:   [id number]

## 2011-01-07 NOTE — Progress Notes (Signed)
   Walk in Patient Form Recieved " Pt left Short Term Disability Packet, and FMLA papers" Sent to [facility]  May 26, 2010 9:22 AM

## 2011-01-07 NOTE — Assessment & Plan Note (Signed)
Summary: recheck      Allergies Added:   History of Present Illness Primary GI MD: [name] Blalock MD FACP [place] Primary Provider: [name] DO Requesting Provider: n/a Chief Complaint: Chest and right arm pain History of Present Illness:   [name] [name] was scheduled for a GI office appointment today with Dr. Sharlett [name].  While getting weighed in  he developed dizziness and discomfort in both his chest, left shoulder and left upper extremity.  He has had similar but less severe pain in the past.  He has known coronary artery disease and had a recent MI.   I was called to assist patient with right arm and shoulder pain and dizziness.  Patient reported 2 week hx of shoulder,   arm pain , and chest tightness with exertion.  Patient  was started on 2 L O2 and nitroglycerin  per Dr Kelby Fam orders.  Patient  was given a total of 3 nitro while waiting for EMS arrival .  Patient  never reported a relief of his chest and arm pain, and while waiting for EMS started to report jaw pain and nausea.  Reported a 9 on a scale of 1-10 at its worst and 4 at its best.  Vitals upon transfer to EMS 108/60, P 76, R 18 and he reported his pain a 4.   Patient 's wife was at the bedside and verified medications and allergies.   [name] Merino RN, CGRN  Apr 23, 2010 12:07 PM           Current Medications (verified): 1)  Glimepiride 2 Mg Tabs (Glimepiride) .... One By Mouth Once Daily 2)  Lantus Solostar 100 Unit/ml Soln (Insulin Glargine) .... Inject 28 - 35 Units Daily 3)  Metformin Hcl 1000 Mg  Tabs (Metformin Hcl) .... Take 1 Tablet By Mouth Two Times A Day 4)  Simvastatin 40 Mg  Tabs (Simvastatin) .... Take 1 Tablet By Mouth At Bedtime 5)  Gabapentin 300 Mg  Caps (Gabapentin) .Marland Kitchen.. 1 By Mouth Two Times A Day 6)  Diovan 40 Mg  Tabs (Valsartan) .... Take 1 Tablet By Mouth Once A Day 7)  Sertraline Hcl 100 Mg  Tabs (Sertraline Hcl) .... Take 1 Tablet By Mouth Two Times A Day 8)  Imdur 60 Mg  Tb24 (Isosorbide  Mononitrate) .Marland Kitchen.. 1 Tab Daily 9)  Fish Oil 1000 Mg Caps (Omega-3 Fatty Acids) .... Take Two Gel Caps Two Times A Day 10)  Nitroglycerin 0.4 Mg Subl (Nitroglycerin) .... Disolve Under Tounge As Needed 11)  Voltaren 1 % Gel (Diclofenac Sodium) .... Apply 2 Grams Qid 12)  Pramipexole Dihydrochloride 0.25 Mg Tabs (Pramipexole Dihydrochloride) .... One By Mouth Qhs 13)  Aspirin 325 Mg  Tabs (Aspirin) .Marland Kitchen.. 1 By Mouth Daily 14)  Novolog Penfill 100 Unit/ml Soln (Insulin Aspart) .... Inject 1-7 Units Subcutaneously Sliding Scale As Needed 15)  Triamcinolone Acetonide 0.1 % Oint (Triamcinolone Acetonide) .... Apply Two Times A Day To Dry Hands 16)  Ferrous Sulfate 325 (65 Fe) Mg  Tabs (Ferrous Sulfate) .... Two Times A Day 17)  Pantoprazole Sodium 40 Mg Tbec (Pantoprazole Sodium) .Marland Kitchen.. 1 By Mouth Once Daily 18)  Novofine 30g X 8 Mm Misc (Insulin Pen Needle) .... Use For Insulin Injection Three Times A Day 19)  Carafate 1 Gm/48ml  Susp (Sucralfate) .... One Tbsp Qid 20)  Plavix 75 Mg Tabs (Clopidogrel Bisulfate) .... Take 1 Tablet By Mouth Once A Day 21)  Bystolic 10 Mg Tabs (Nebivolol Hcl) .... Take 1  Tab Daily As Directed  Allergies (verified): 1)  ! Morphine  Vital Signs:  Patient profile:   65 year old male Height:      69 inches Weight:      188 pounds BMI:     27.86 BSA:     2.01 O2 Sat:      2 % Pulse rate:   76 / minute Pulse rhythm:   regular Resp:     18 per minute BP sitting:   102 / 60  (right arm) Cuff size:   regular  Physical Exam  Additional Exam:  On physical exam he is an ill-appearing male who is slightly diaphoretic  Chest is clear on cardiac exam are no murmurs, gallops or rubs   Impression & Recommendations:  Problem # 1:  CHEST PAIN UNSPECIFIED (ICD-786.50) Patient was given 2 nitroglycerin with some improvement in his chest and shoulder discomfort.  Cardiac ischemia is an obvious concern.  Recommendations #1 EMS was called to take the patient to the  emergency room for evaluation.

## 2011-01-07 NOTE — Miscellaneous (Signed)
  Clinical Lists Changes  Observations: Added new observation of PAST MED HX: Diabetes Mellitus Type II - uncontrolled. CAD.....Marland Kitchenstatus post coronary artery bypass graft in 2000, status post cardiac catheterization in 2006,     2009..continued chest pain and shortness of breath despite oral medication adjustments including ranexa.     Catheter November 2009/...DES Stent..11/26/2008 to distal RCA leading to acute marginal.  /  cath..01/26/2010..continued patentcy of grafts and stent to RCA.Marland KitchenMarland KitchenModest progression of distal disease...medical Rx CABG..2000 Obstructive sleep apnea.  I do not know if it is possible if this is playing any role in his overall general fatigue. Depression.  He is on sertraline for this.   Hyperlipidemia and he is tolerating simvastatin.  Good left ventricular function.  Palpitations.  He had sinus rhythm when he wore his  event recorder.  Renal stones. Rrestless legs. ..possible RBBB Depression / Bipolar  Median sternotomy suture... one is broken... noted on chest x-ray November, 2010... no clinical significance Dizziness Edema Hypertension SOB Spondylosis.Marland KitchenMarland KitchenC5-6,C6-7  MRI...10/15/2009 MRI  Head....chronic microvascular ischemia....10/2009 Anemia..  hemoglobin 7.4.Marland KitchenMarland KitchenMarland KitchenMarland Kitchen iron deficiency... January, 2011.... endoscopy normal....2 unit transfusion in hospital  /  capsule endoscopy..February, 2011.... no small bowel abnormalities.... most likely source of bleeding was the gastric erosions..... meds adjusted.... patient to see Dr.Patterson soon   (02/16/2010 14:21) Added new observation of REFERRING MD: Verl Blalock (02/16/2010 14:21) Added new observation of PRIMARY MD: [name] DO (02/16/2010 14:21)       Past History:  Past Medical History: Diabetes Mellitus Type II - uncontrolled. CAD.....Marland Kitchenstatus post coronary artery bypass graft in 2000, status post cardiac catheterization in 2006,     2009..continued chest pain and shortness of breath despite oral  medication adjustments including ranexa.     Catheter November 2009/...DES Stent..11/26/2008 to distal RCA leading to acute marginal.  /  cath..01/26/2010..continued patentcy of grafts and stent to RCA.Marland KitchenMarland KitchenModest progression of distal disease...medical Rx CABG..2000 Obstructive sleep apnea.  I do not know if it is possible if this is playing any role in his overall general fatigue. Depression.  He is on sertraline for this.   Hyperlipidemia and he is tolerating simvastatin.  Good left ventricular function.  Palpitations.  He had sinus rhythm when he wore his  event recorder.  Renal stones. Rrestless legs. ..possible RBBB Depression / Bipolar  Median sternotomy suture... one is broken... noted on chest x-ray November, 2010... no clinical significance Dizziness Edema Hypertension SOB Spondylosis.Marland KitchenMarland KitchenC5-6,C6-7  MRI...10/15/2009 MRI  Head....chronic microvascular ischemia....10/2009 Anemia..  hemoglobin 7.4.Marland KitchenMarland KitchenMarland KitchenMarland Kitchen iron deficiency... January, 2011.... endoscopy normal....2 unit transfusion in hospital  /  capsule endoscopy..February, 2011.... no small bowel abnormalities.... most likely source of bleeding was the gastric erosions..... meds adjusted.... patient to see Dr.Patterson soon

## 2011-01-07 NOTE — Assessment & Plan Note (Signed)
Summary: rov/sob/dizziness  Medications Added IMDUR 60 MG  TB24 (ISOSORBIDE MONONITRATE) 1 tab daily METAXALONE 800 MG TABS (METAXALONE) one by mouth at bedtime as needed      Allergies Added:   Visit Type:  Follow-up Primary Provider:  [name] DO  CC:  CAD.  History of Present Illness: Patient is seen for follow up of coronary disease.  I saw him last November 17, 2009.  Since that time the patient has seen Dr.Yoo on 2 occasions.  I have reviewed the records from those visits.  Patient continues to have dizziness and headaches.  His MRI data was reviewed by Dr.Yoo.  At this point there is no active neurologic workup ongoing.  The patient has dizziness.  His wife states that he staggers at times.  He does not seem to remember these events as well.  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  Sometimes he has some bruises that can't be explained well although he is on Plavix.  He says at times that he gets an indigestion feeling.  This may well be ischemia.  In the past he has worn an event recorder.  We may need to proceed with a loop recorder.  Current Medications (verified): 1)  Glimepiride 2 Mg Tabs (Glimepiride) .... One By Mouth Once Daily 2)  Lantus Solostar 100 Unit/ml Soln (Insulin Glargine) .... Inject 28 - 35 Units Daily 3)  Metformin Hcl 1000 Mg  Tabs (Metformin Hcl) .... Take 1 Tablet By Mouth Two Times A Day 4)  Simvastatin 40 Mg  Tabs (Simvastatin) .... Take 1 Tablet By Mouth At Bedtime 5)  Gabapentin 300 Mg  Caps (Gabapentin) .Marland Kitchen.. 1 By Mouth Two Times A Day 6)  Diovan 40 Mg  Tabs (Valsartan) .... Take 1 Tablet By Mouth Once A Day 7)  Sertraline Hcl 100 Mg  Tabs (Sertraline Hcl) .... Take 1 Tablet By Mouth Two Times A Day 8)  Imdur 60 Mg  Tb24 (Isosorbide Mononitrate) .Marland Kitchen.. 1 &1/2 Tablets By Mouth Once Daily 9)  Plavix 75 Mg Tabs (Clopidogrel Bisulfate) .... Take One By Mouth Once Daily 10)  Carvedilol 12.5 Mg Tabs (Carvedilol) .... Take One Tablet By  Mouth Twice A Day 11)  Fish Oil 1000 Mg Caps (Omega-3 Fatty Acids) .... Take Two Gel Caps Two Times A Day 12)  Nitroglycerin 0.4 Mg Subl (Nitroglycerin) .... Disolve Under Tounge As Needed 13)  Voltaren 1 % Gel (Diclofenac Sodium) .... Apply 2 Grams Qid 14)  Pramipexole Dihydrochloride 0.25 Mg Tabs (Pramipexole Dihydrochloride) .... One By Mouth Qhs 15)  Vicodin 5-500 Mg Tabs (Hydrocodone-Acetaminophen) .... Take 1 Tablet By Mouth Two Times A Day As Needed 16)  Aspirin 325 Mg  Tabs (Aspirin) .Marland Kitchen.. 1 By Mouth Daily 17)  Novolog Penfill 100 Unit/ml Soln (Insulin Aspart) .... Inject 1-7 Units Subcutaneously Sliding Scale As Needed 18)  Amlodipine Besylate 2.5 Mg Tabs (Amlodipine Besylate) .... One By Mouth Once Daily 19)  Metaxalone 800 Mg Tabs (Metaxalone) .... One By Mouth At Bedtime As Needed 20)  Triamcinolone Acetonide 0.1 % Oint (Triamcinolone Acetonide) .... Apply Two Times A Day To Dry Hands  Allergies (verified): 1)  ! Morphine  Past History:  Past Medical History: Last updated: 12/19/2009 Diabetes Mellitus Type II - uncontrolled. CAD.....Marland Kitchenstatus post coronary artery bypass graft in 2000, status post cardiac catheterization in 2006,     2009..continued chest pain and shortness of breath despite oral medication adjustments including ranexa.     Catheter November 2009/...DES Stent..11/26/2008 to  distal RCA leading to acute marginal. CABG..2000 Obstructive sleep apnea.  I do not know if it is possible if this is playing any role in his overall general fatigue. Depression.  He is on sertraline for this.   Hyperlipidemia and he is tolerating simvastatin.  Good left ventricular function.  Palpitations.  He had sinus rhythm when he wore his  event recorder.  Renal stones. Rrestless legs. ..possible RBBB Depression / Bipolar  Median sternotomy suture... one is broken... noted on chest x-ray November, 2010... no clinical  significance Dizziness Edema Hypertension SOB Spondylosis.Marland KitchenMarland KitchenC5-6,C6-7  MRI...10/15/2009 MRI  Head....chronic microvascular ischemia....10/2009    Review of Systems       Patient denies fever, chills, sweats.  Is not having change in vision or hearing.  He does have chest discomfort as described in the history of present illness.  He does not have cough.  There is no nausea or vomiting.  There no urinary symptoms.  All other systems are reviewed and are negative.  Vital Signs:  Patient profile:   65 year old male Height:      69 inches Weight:      190 pounds BMI:     28.16 Pulse rate:   75 / minute Pulse (ortho):   70 / minute BP sitting:   114 / 60  (left arm) BP standing:   113 / 58 Cuff size:   regular  Vitals Entered By: Mignon Pine, RMA (December 23, 2009 4:05 PM)  Serial Vital Signs/Assessments:  Time      Position  BP       Pulse  Resp  Temp     By 5:05pm    Lying RA  123/63   60                    Kevan Rosebush, RN 5:05pm    Sitting   117/62   62                    Kevan Rosebush, RN 5:05pm    Standing  113/58   70                    Kevan Rosebush, RN  Comments: 5:05pm w/sitting pt developed a thightness in his throat w/standing he dev. dizziness 2 min standing--BP 121/66 HR 66 symptoms improved 5 min standing--BP 120/69 HR 71 no symptoms By: Kevan Rosebush, RN    Physical Exam  General:  patient is stable in general today. Head:  head is atraumatic. Eyes:  no xanthelasma. Neck:  no jugular venous distention.  No carotid bruits. Chest Wall:  no chest wall tenderness. Lungs:  lungs are clear.  Respiratory effort is nonlabored. Heart:  cardiac exam S1 and S2 present no clicks or significant murmurs. Abdomen:  abdomen is soft. Msk:  no musculoskeletal deformities. Extremities:  no peripheral edema. Skin:  no rashes are seen. Psych:  patient is oriented to person time and place.  Affect is normal.  The patient is here with his wife.   Impression &  Recommendations:  Problem # 1:  HEADACHE (ICD-784.0)  His updated medication list for this problem includes:    Carvedilol 12.5 Mg Tabs (Carvedilol) .Marland Kitchen... Take one tablet by mouth twice a day    Vicodin 5-500 Mg Tabs (Hydrocodone-acetaminophen) .Marland Kitchen... Take 1 tablet by mouth two times a day as needed    Aspirin 325 Mg Tabs (Aspirin) .Marland Kitchen... 1 by mouth daily At this point I have nothing  to add to the patient's intermittent headaches.  This is being evaluated by his primary physician.  Problem # 2:  DIZZINESS (ICD-780.4) This time it is very difficult to explain his dizziness.  We have no proof that it is a bradycardia or tachyarrhythmia.  Over time I will consider an implantable loop recorder. Orthostatic blood pressure check was done.  The patient had a 10 mm drop in systolic blood pressure when standing.  He did have some slight dizziness and slight discomfort in his throat.  It is possible that some of his symptoms are from orthostasis.  We will stop his amlodipine and deceased 2.5 mg.  Also Imdur will be reduced to 60 mg daily.  Problem # 3:  BENIGN POSITIONAL VERTIGO (ICD-386.11) We believe that the patient has some benign positional vertigo.  I need to be sure that he does not have significant orthostatic hypotension.  Orthostatic blood pressure check is currently being done.  Problem # 4:  PALPITATIONS (ICD-785.1) The patient does have some rare palpitations.  Problem # 5:  CORONARY ARTERY DISEASE (ICD-414.00) The patient has known significant coronary disease.  He now describes epigastric burning.  This is exertional.  I feel we do need to proceed with cardiac catheterization to assess this further.  This will be arranged.  Problem # 6:  Addendum Hemoglobin came back 7.4...Marland KitchenMarland KitchenPatient is to be admitted 12/25/2008 for anemia treatment and evaluation.  Other Orders: Cardiac Catheterization (Cardiac Cath)  Patient Instructions: 1)  Stop Amlodipine 2)  Decrease Imdur to 60mg  daily 3)   Return for labs on Wed. 12/24/09, I will give you your cath instructions at that time. 4)  Your physician has requested that you have a cardiac catheterization.  Cardiac catheterization is used to diagnose and/or treat various heart conditions. Doctors may recommend this procedure for a number of different reasons. The most common reason is to evaluate chest pain. Chest pain can be a symptom of coronary artery disease (CAD), and cardiac catheterization can show whether plaque is narrowing or blocking your heart's arteries. This procedure is also used to evaluate the valves, as well as measure the blood flow and oxygen levels in different parts of your heart.  For further information please visit [email or web address].  Please follow instruction sheet, as given.

## 2011-01-07 NOTE — Assessment & Plan Note (Signed)
Summary: 1 month follow up/mhf   Vital Signs:  Patient profile:   65 year old male Weight:      188.50 pounds BMI:     27.94 O2 Sat:      100 % on Room air Temp:     98.2 degrees F oral Pulse rate:   63 / minute Pulse rhythm:   regular Resp:     18 per minute BP sitting:   110 / 60  (right arm) BP standing:   112 / 60  (right arm) Cuff size:   regular  Vitals Entered By: Jiles Garter CMA (December 19, 2009 10:03 AM)  O2 Flow:  Room air  Primary Care Provider:  D. Drema Pry DO  CC:  1 month Follow up .  History of Present Illness: 1 Month follow up   65 y/o white male with hx of CAD and DM II for follow up.   Pt continues to have spells of dizziness.  He is accompanied by his wife.  His symptoms are not limited to head movement.   He will get dizzy with any strenuous activity.  No orthostatic symptoms. No hypoglycemia   He also continues to have dyspnea and intermittent stabbing type chest. He also report new heartburn like symptoms that is triggered by activity and relieved by rest.  Allergies: 1)  ! Morphine  Past History:  Past Medical History: Diabetes Mellitus Type II - uncontrolled. CAD.....Marland Kitchenstatus post coronary artery bypass graft in 2000, status post cardiac catheterization in 2006,     2009..continued chest pain and shortness of breath despite oral medication adjustments including ranexa.     Catheter November 2009/...DES Stent..11/26/2008 to distal RCA leading to acute marginal. CABG..2000 Obstructive sleep apnea.  I do not know if it is possible if this is playing any role in his overall general fatigue. Depression.  He is on sertraline for this.   Hyperlipidemia and he is tolerating simvastatin.  Good left ventricular function.  Palpitations.  He had sinus rhythm when he wore his  event recorder.  Renal stones. Rrestless legs. ..possible RBBB Depression / Bipolar  Median sternotomy suture... one is broken... noted on chest x-ray November, 2010... no  clinical significance Dizziness Edema Hypertension SOB Spondylosis.Marland KitchenMarland KitchenC5-6,C6-7  MRI...10/15/2009 MRI  Head....chronic microvascular ischemia....10/2009    Past Surgical History: Nasal Septum repair Coronary artery bypass graft 2000  percutaneous stenting of the distal right  coronary artery 11/2008         Family History: Mother deceased at age 47, has history of heart disease, died during coronary artery bypass graft.  Father deceased at age 62 from unknown arrhythmia.  The patient has 4 brothers, all have diabetes, 1 has a history of cerebrovascular accident and 1 has prostate cancer.    One of his brothers recently diagnosed to pancreatic cancer. Brother with recent diagnosis of CAD and CABG sheduled. Nephew with cardiac arrest  Family History of Prostate Cancer:brother          Social History: The patient is married, has 4 daughters, 1 daughter lives with the patient.  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    Never Smoked   Alcohol use-no   Illicit Drug Use - no    Daily Caffeine Use coffee in the AM    Review of Systems       The patient complains of chest pain and dyspnea on exertion.  The patient denies weight loss and weight gain.    Physical Exam  General:  alert, well-developed, and well-nourished.  somewhat pale Head:  normocephalic and atraumatic.   Eyes:  pupils equal, pupils round, and pupils reactive to light.  no nystagmus Ears:  R ear normal and L ear normal.   Neck:  supple, full ROM, and no masses.   Lungs:  normal respiratory effort and normal breath sounds.   Heart:  normal rate, regular rhythm, and no gallop.   Abdomen:  soft, non-tender, and normal bowel sounds.   Extremities:  No lower extremity edema  Neurologic:  cranial nerves II-XII intact and gait normal.     Impression & Recommendations:  Problem # 1:  DIZZINESS (ICD-780.4) Pt having persistent dizziness.  His symptoms are not positional or orthostatic.  I am  concerned symptoms worse with exertion.   I would favor further cardiac work up.  Exertional heartburn symptoms especially concerning for anginal equivalent.   Copy note to Dr. [name] and Dr. Lia Foyer  Problem # 2:  LEG PAIN, BILATERAL (ICD-729.5) Reduce gabapentin dose as it may be contributing factor to dizziness  Problem # 3:  HYPERTENSION (ICD-401.9) stable.  Maintain current medication regimen.  His updated medication list for this problem includes:    Diovan 40 Mg Tabs (Valsartan) .Marland Kitchen... Take 1 tablet by mouth once a day    Carvedilol 12.5 Mg Tabs (Carvedilol) .Marland Kitchen... Take one tablet by mouth twice a day    Amlodipine Besylate 2.5 Mg Tabs (Amlodipine besylate) ..... One by mouth once daily  BP today: 110/60 Prior BP: 120/70 (11/21/2009)  Labs Reviewed: K+: 4.5 (09/12/2009) Creat: : 0.91 (09/12/2009)   Chol: 97 (06/05/2009)   HDL: 31.80 (06/05/2009)   LDL: 57 (06/05/2009)   TG: 42.0 (06/05/2009)  Problem # 4:  SHORTNESS OF BREATH (ICD-786.05) I suggest further cardiac w/u  Complete Medication List: 1)  Glimepiride 2 Mg Tabs (Glimepiride) .... One by mouth once daily 2)  Lantus Solostar 100 Unit/ml Soln (Insulin glargine) .... Inject 28 - 35 units daily 3)  Metformin Hcl 1000 Mg Tabs (Metformin hcl) .... Take 1 tablet by mouth two times a day 4)  Simvastatin 40 Mg Tabs (Simvastatin) .... Take 1 tablet by mouth at bedtime 5)  Gabapentin 300 Mg Caps (Gabapentin) .Marland Kitchen.. 1 by mouth two times a day 6)  Diovan 40 Mg Tabs (Valsartan) .... Take 1 tablet by mouth once a day 7)  Sertraline Hcl 100 Mg Tabs (Sertraline hcl) .... Take 1 tablet by mouth two times a day 8)  Imdur 60 Mg Tb24 (Isosorbide mononitrate) .Marland Kitchen.. 1 &1/2 tablets by mouth once daily 9)  Plavix 75 Mg Tabs (Clopidogrel bisulfate) .... Take one by mouth once daily 10)  Carvedilol 12.5 Mg Tabs (Carvedilol) .... Take one tablet by mouth twice a day 11)  Fish Oil 1000 Mg Caps (Omega-3 fatty acids) .... Take two gel caps two times a  day 12)  Nitroglycerin 0.4 Mg Subl (Nitroglycerin) .... Disolve under tounge as needed 13)  Voltaren 1 % Gel (Diclofenac sodium) .... Apply 2 grams qid 14)  Pramipexole Dihydrochloride 0.25 Mg Tabs (Pramipexole dihydrochloride) .... One by mouth qhs 15)  Vicodin 5-500 Mg Tabs (Hydrocodone-acetaminophen) .... Take 1 tablet by mouth two times a day as needed 16)  Aspirin 325 Mg Tabs (Aspirin) .Marland Kitchen.. 1 by mouth daily 17)  Novolog Penfill 100 Unit/ml Soln (Insulin aspart) .... Inject 1-7 units subcutaneously sliding scale as needed 18)  Amlodipine Besylate 2.5 Mg Tabs (Amlodipine besylate) .... One by mouth once daily 19)  Metaxalone 800 Mg Tabs (  Metaxalone) .... One by mouth qhs 20)  Triamcinolone Acetonide 0.1 % Oint (Triamcinolone acetonide) .... Apply two times a day to dry hands  Other Orders: EKG w/ Interpretation (93000)  Patient Instructions: 1)  Follow up with Dr. [name]   Current Allergies (reviewed today): ! MORPHINE    Appended Document: Orders Update    Clinical Lists Changes  Orders: Added new Referral order of Cardiology Referral (Cardiology) - Signed      Appended Document: 1 month follow up/mhf Discussed with Dr. [name].  He follows patient.  TS

## 2011-01-07 NOTE — Medication Information (Signed)
Summary: Approved Pantoprazole / [place]  Approved Pantoprazole / [place] By: Rise Patience 11/11/2010 13:58:41  _____________________________________________________________________  External Attachment:    Type:   Image     Comment:   External Document

## 2011-01-07 NOTE — Progress Notes (Signed)
Summary: Medication Refill  Phone Note Refill Request   Refills Requested: Medication #1:  skelaxin 800 mg tablet   Dosage confirmed as above?Dosage Confirmed   Brand Name Necessary? No   Supply Requested: 1 month   Last Refilled: 12/18/2009  Medication #2:  amlodipine besylate 2.5 mg tab   Dosage confirmed as above?Dosage Confirmed   Brand Name Necessary? No   Supply Requested: 1 month   Last Refilled: 12/18/2009  Medication #3:  METFORMIN HCL 1000 MG  TABS Take 1 tablet by mouth two times a day   Dosage confirmed as above?Dosage Confirmed   Brand Name Necessary? No   Supply Requested: 1 month  Method Requested: Electronic Next Appointment Scheduled: 01-29-2010 Dr [name] Initial call taken by: Shanon Payor,  January 19, 2010 10:31 AM  Follow-up for Phone Call        refill x 1 Follow-up by: D. Drema Pry DO,  January 19, 2010 2:07 PM  Additional Follow-up for Phone Call Additional follow up Details #1::        call placed to patient  to verify medication , wife Santiago Glad stated patient was not available. She was not able to verify if patient requested refills on medication. Spoke with Sarah at [facility] and she states she was unable to verify if patient requested a refill on Medication. She was informed that patient was no longer taking Amlodipine.  Additional Follow-up by: Jiles Garter CMA,  January 19, 2010 2:57 PM    Additional Follow-up for Phone Call Additional follow up Details #2::    no return call from patient regarding medication refill request Follow-up by: Jiles Garter CMA,  January 20, 2010 4:33 PM

## 2011-01-07 NOTE — Progress Notes (Signed)
Summary: Triage   Phone Note Call from Patient Call back at [phone]   Caller: Patient Call For: Dr. Sharlett Iles Reason for Call: Talk to Nurse Summary of Call: Pt. was taken off his Plavix to have his Endo and wants to know when he can continue taking them Initial call taken by: Webb Laws,  January 09, 2010 4:22 PM  Follow-up for Phone Call        Pt had Pill Cam 2 days ago.  Pt instructed to remain off plavix as instructed at hosp discharge (see summary) until results of Pill cam are read. Follow-up by: Alberteen Spindle RN,  January 09, 2010 4:37 PM     Appended Document: Triage needs to be on  chronic Aciphex 20 mg qam forever.Marland KitchenMarland KitchenResume plavix.Marland KitchenMarland Kitchen  Appended Document: Triage DONNA,MAKE SURE HE IS ON ACIPHEX DAILY SHOULD TAKE IN AM AND TAKE THE PLAVIX IN PM. HE SHOULD HAVE A FOLLOW UP CBC IN A MONTH.  Appended Document: Triage Pt notified.  Pt has Post Hosp follow up appt with Dr. [name] on 2/24//11.  If CBC is not done at this appt pt will call us.  Rx sent to Pharmacy.    Clinical Lists Changes  Medications: Added new medication of ACIPHEX 20 MG  TBEC (RABEPRAZOLE SODIUM) Take 1 each day 30 minutes before breakfast - Signed Rx of ACIPHEX 20 MG  TBEC (RABEPRAZOLE SODIUM) Take 1 each day 30 minutes before breakfast;  #30 x 11;  Signed;  Entered by: Alberteen Spindle RN;  Authorized by: Sable Feil MD [facility];  Method used: Electronically to [facility] [phone]*, [address], [place], [place]  [address], Ph: [phone] or [phone], Fax: [phone]    Prescriptions: ACIPHEX 20 MG  TBEC (RABEPRAZOLE SODIUM) Take 1 each day 30 minutes before breakfast  #30 x 11   Entered by:   Alberteen Spindle RN   Authorized by:   Sable Feil MD [facility]   Signed by:   Alberteen Spindle RN on 01/12/2010   Method used:   Electronically to        [facility] [phone] (retail)       [address]       [place], [place]  [address]       Ph: [phone] or [phone]       Fax: [phone]   RxID:    [id number]

## 2011-01-07 NOTE — Assessment & Plan Note (Signed)
Summary: per check out/sf  Medications Added IMDUR 60 MG  TB24 (ISOSORBIDE MONONITRATE) 1 tablet by mouth once daily BYSTOLIC 5 MG TABS (NEBIVOLOL HCL)       Allergies Added:   Visit Type:  Follow-up Primary Provider:  [name] DO  CC:   CAD  /  falling spells.  History of Present Illness: Patient is seen for followup of coronary artery disease.  He has severe disease.  I have recommended disability.  He continues to have some intermittent chest pain.  In addition he is having falling spells.  These have been witnessed by his wife.  He has not been injured.  There is no syncope or presyncope.  Current Medications (verified): 1)  Glimepiride 2 Mg Tabs (Glimepiride) .... One By Mouth Once Daily 2)  Lantus Solostar 100 Unit/ml Soln (Insulin Glargine) .... Inject 28 - 35 Units Daily 3)  Metformin Hcl 1000 Mg  Tabs (Metformin Hcl) .... Take 1 Tablet By Mouth Two Times A Day 4)  Simvastatin 40 Mg  Tabs (Simvastatin) .... Take 1 Tablet By Mouth At Bedtime 5)  Gabapentin 300 Mg  Caps (Gabapentin) .Marland Kitchen.. 1 By Mouth Two Times A Day 6)  Sertraline Hcl 100 Mg  Tabs (Sertraline Hcl) .... Take 1 Tablet By Mouth Two Times A Day 7)  Imdur 60 Mg  Tb24 (Isosorbide Mononitrate) .Marland Kitchen.. 1 Tablet By Mouth Once Daily 8)  Fish Oil 1000 Mg Caps (Omega-3 Fatty Acids) .... Take Two Gel Caps Two Times A Day 9)  Nitrostat 0.4 Mg Subl (Nitroglycerin) .Marland Kitchen.. 1 Tablet Under Tongue At Onset of Chest Pain; You May Repeat Every 5 Minutes For Up To 3 Doses. 10)  Voltaren 1 % Gel (Diclofenac Sodium) .... Apply 2 Grams Qid As Needed 11)  Pramipexole Dihydrochloride 0.25 Mg Tabs (Pramipexole Dihydrochloride) .... One By Mouth Qhs 12)  Aspirin 81 Mg Tbec (Aspirin) .... Take One Tablet By Mouth Daily 13)  Novolog Penfill 100 Unit/ml Soln (Insulin Aspart) .... Inject 1-7 Units Subcutaneously Sliding Scale As Needed 14)  Triamcinolone Acetonide 0.1 % Oint (Triamcinolone Acetonide) .... Apply Two Times A Day To Dry Hands 15)   Ferrous Sulfate 325 (65 Fe) Mg  Tabs (Ferrous Sulfate) .... Two Times A Day 16)  Pantoprazole Sodium 40 Mg Tbec (Pantoprazole Sodium) .Marland Kitchen.. 1 By Mouth Once Daily 17)  Novofine 30g X 8 Mm Misc (Insulin Pen Needle) .... Use For Insulin Injection Three Times A Day 18)  Carafate 1 Gm/46ml  Susp (Sucralfate) .... One Tbsp Qid 19)  Plavix 75 Mg Tabs (Clopidogrel Bisulfate) .... Take 1 Tablet By Mouth Once A Day 20)  Bystolic 10 Mg Tabs (Nebivolol Hcl) .... Take 1  Tab Daily As Directed 21)  Levothyroxine Sodium 50 Mcg Tabs (Levothyroxine Sodium) .... One By Mouth Once Daily 22)  Qvar 40 Mcg/act Aers (Beclomethasone Dipropionate) .... 2 Puffs Two Times A Day  Allergies (verified): 1)  ! Morphine  Past History:  Past Medical History: Diabetes Mellitus Type II - uncontrolled. CAD.....Marland Kitchenstatus post coronary artery bypass graft in 2000, status post cardiac catheterization in 2006,     2009..continued chest pain and shortness of breath despite oral medication adjustments including ranexa.     Catheter November 2009/...DES Stent..11/26/2008 to distal RCA leading to acute marginal.  /  cath..01/26/2010..continued patentcy of grafts and stent to RCA.Marland KitchenMarland KitchenModest progression of distal disease...medical Rx  /  [facility]  Apr 23, 2010... no MI... home.. Diovan stopped and into her dose reduced for relative low blood pressure CABG..2000  Obstructive sleep apnea.  I do not know if it is possible if this is playing any role in his overall general fatigue. Depression.  He is on sertraline for this.   Hyperlipidemia and he is tolerating simvastatin.   Good left ventricular function.  Palpitations.  He had sinus rhythm when he wore his  event recorder. Renal stones. Rrestless legs. ..on dopamine agonist. RBBB Depression / Bipolar  Median sternotomy suture... one is broken... noted on chest x-ray November, 2010... no clinical significance Dizziness Edema Hypertension SOB....cardiopulmonary exercise test... March 12, 2010...  mild functional limitation.... no clear pulmonary or cardiac limitation.... possible deconditioning and mild chronotropic incompetence...( peak heart rate 130) Spondylosis.Marland KitchenMarland KitchenC5-6,C6-7  MRI...10/15/2009 MRI  Head....chronic microvascular ischemia....10/2009 Anemia..  hemoglobin 7.4.Marland KitchenMarland KitchenMarland KitchenMarland Kitchen iron deficiency... January, 2011.... endoscopy normal....2 unit transfusion in hospital  /  capsule endoscopy..February, 2011.... no small bowel abnormalities.... most likely source of bleeding was the gastric erosions..... meds adjusted.... patient to see Dr.Patterson soon Recommendation for disability   ... May 21, 2010 Falling spells    these have occurred in the past and are again recurring 2011     Review of Systems       Patient denies fever, chills, headache, sweats, rash, change in vision, change in hearing, cough, nausea vomiting, urinary symptoms.  All of the systems are reviewed and are negative.  Vital Signs:  Patient profile:   65 year old male Height:      69 inches Weight:      189 pounds BMI:     28.01 Pulse rate:   70 / minute Pulse (ortho):   66 / minute BP sitting:   122 / 66  (left arm) BP standing:   121 / 70 Cuff size:   regular  Vitals Entered By: Mignon Pine, RMA (August 11, 2010 9:47 AM)  Serial Vital Signs/Assessments:  Time      Position  BP       Pulse  Resp  Temp     By           Lying RA  118/72   [address] [place]           Sitting   118/69   [address], [place]           Standing  121/70   66                    Mignon Pine, [place]  Comments: 2 mins  BP 110/69  P 72 4 mins  BP  111/73 P 69 Chest pains with laying   Slight dizzy when sitting stayed till last standing By: Mignon Pine, RMA    Physical Exam  General:  patient is stable today.  He is here with his wife. Head:  head is atraumatic. Eyes:  no xanthelasma. Neck:  no jugular venous distention. Chest Wall:  no chest wall tenderness. Lungs:   lungs are clear.  Respiratory effort is nonlabored. Heart:  cardiac exam reveals S1-S2.  No clicks or significant murmurs. Abdomen:  abdomen is soft. Msk:  no musculoskeletal deformities. Extremities:  no peripheral edema. Skin:  no skin rashes. Psych:  patient is oriented to person time and place.  Affect is normal.   Impression & Recommendations:  Problem #  1:  HYPOTHYROIDISM (ICD-244.9)  The following medications were removed from the medication list:    Levothyroxine Sodium 25 Mcg Tabs (Levothyroxine sodium) ..... One by mouth once daily His updated medication list for this problem includes:    Levothyroxine Sodium 50 Mcg Tabs (Levothyroxine sodium) ..... One by mouth once daily This problem was treated.  No change in therapy.  Problem # 2:  ANEMIA (ICD-285.9)  Hemoglobin will be checked again today to be sure that it is not causing his spells.  Orders: TLB-CBC Platelet - w/Differential (85025-CBCD)  Problem # 3:  SHORTNESS OF BREATH (ICD-786.05)  His updated medication list for this problem includes:    Aspirin 81 Mg Tbec (Aspirin) .Marland Kitchen... Take one tablet by mouth daily    Bystolic 5 Mg Tabs (Nebivolol hcl) No significant shortness of breath today.  Problem # 4:  EDEMA (ICD-782.3) no significant edema.  Problem # 5:  PALPITATIONS (ICD-785.1)  His updated medication list for this problem includes:    Imdur 60 Mg Tb24 (Isosorbide mononitrate) .Marland Kitchen... 1 tablet by mouth once daily    Nitrostat 0.4 Mg Subl (Nitroglycerin) .Marland Kitchen... 1 tablet under tongue at onset of chest pain; you may repeat every 5 minutes for up to 3 doses.    Aspirin 81 Mg Tbec (Aspirin) .Marland Kitchen... Take one tablet by mouth daily    Plavix 75 Mg Tabs (Clopidogrel bisulfate) .Marland Kitchen... Take 1 tablet by mouth once a day    Bystolic 5 Mg Tabs (Nebivolol hcl) Is having no significant palpitations.  Problem # 6:  CORONARY ARTERY DISEASE (ICD-414.00)  His updated medication list for this problem includes:    Imdur 60 Mg  Tb24 (Isosorbide mononitrate) .Marland Kitchen... 1 tablet by mouth once daily    Nitrostat 0.4 Mg Subl (Nitroglycerin) .Marland Kitchen... 1 tablet under tongue at onset of chest pain; you may repeat every 5 minutes for up to 3 doses.    Aspirin 81 Mg Tbec (Aspirin) .Marland Kitchen... Take one tablet by mouth daily    Plavix 75 Mg Tabs (Clopidogrel bisulfate) .Marland Kitchen... Take 1 tablet by mouth once a day    Bystolic 5 Mg Tabs (Nebivolol hcl) Coronary disease is stable.  He is having some chest pain but no further testing will be done at this time.  Problem # 7:  * FALLING SPELLS  Etiology of the falling spells remains unclear.  Orthostatic blood pressures were checked today.  The patient's pulse was in the range of 60 With blood pressure 118/72.  He did have some slight dizziness when standing.  Blood pressure initially was 120 and then settled at [id number] systolic when standing.  Peak rate was 72.  This does not appear to show a marked orthostatic change.  However I plan to decrease his diastolic from 10 mg to 5 mg daily.  I will also see him for follow up soon.  We need a fall neurologic evaluation to help assess his overall status and to see if we can find an etiology for his falling spells.  Orders: Neurology Referral (Neuro)  Patient Instructions: 1)  Decrease Bystolic to 5mg  daily 2)  Labwork today 3)  You have been referred to Neurology 4)  Follow up in 8 weeks

## 2011-01-07 NOTE — Assessment & Plan Note (Signed)
Summary: fatigue / tf,cma   Vital Signs:  Patient profile:   65 year old male Height:      69 inches Weight:      181.50 pounds BMI:     26.90 O2 Sat:      99 % on Room air Temp:     97.5 degrees F oral Pulse rate:   64 / minute Resp:     18 per minute BP sitting:   126 / 60  (right arm) Cuff size:   large  Vitals Entered By: Jiles Garter CMA (December 10, 2010 2:06 PM)  O2 Flow:  Room air CC: Fatigue Is Patient Diabetic? Yes Pain Assessment Patient in pain? no      Comments Sudden onset Friday morning, c/o  left groin discomfort, and lower left back discomfort, becomes fatigue after minimal activity   Primary Care Provider:  [name] DO  CC:  Fatigue.  History of Present Illness: feeling tired since Friday some pain lower abd  1 week ago - he felt weak and exp chest pain he was seen at ER in [facility] cardiac enzymes reported normal  lowest blood sugar 108 he has been checking his blood sugar regularly  Allergies: 1)  ! Morphine  Past History:  Past Medical History: Diabetes Mellitus Type II - uncontrolled. CAD.....Marland Kitchenstatus post coronary artery bypass graft in 2000, status post cardiac catheterization in 2006,     2009..continued chest pain and shortness of breath despite oral medication adjustments including ranexa.     Catheter November 2009/...DES Stent..11/26/2008 to distal RCA leading to acute marginal.  /  cath..01/26/2010..continued patentcy of grafts and stent to RCA.Marland KitchenMarland KitchenModest progression of distal disease...medical Rx  /  [facility]  Apr 23, 2010... no MI... home.. Diovan stopped and into her dose reduced for relative low blood pressure CABG..2000      Obstructive sleep apnea.  I do not know if it is possible if this is playing any role in his overall general fatigue. Depression.  He is on sertraline for this.   Hyperlipidemia and he is tolerating simvastatin.   Good left ventricular function.   Palpitations.  He had sinus rhythm when he wore his   event recorder... Renal stones. Rrestless legs. ..on dopamine agonist. RBBB Depression / Bipolar  Median sternotomy suture... one is broken... noted on chest x-ray November, 2010... no clinical significance Dizziness Edema Hypertension SOB....cardiopulmonary exercise test... March 12, 2010... mild functional limitation.... no clear pulmonary or cardiac limitation.... possible deconditioning and mild chronotropic incompetence...( peak heart rate 130) Spondylosis.Marland KitchenMarland KitchenC5-6,C6-7  MRI...10/15/2009 MRI  Head....chronic microvascular ischemia....10/2009 Anemia..  hemoglobin 7.4.Marland KitchenMarland KitchenMarland KitchenMarland Kitchen iron deficiency... January, 2011.... endoscopy normal....2 unit transfusion in hospital  /  capsule endoscopy..February, 2011.... no small bowel abnormalities.... most likely source of bleeding was the gastric erosions..... meds adjusted.... patient to see Dr.Patterson soon Recommendation for disability   ... May 21, 2010 Falling spells    these have occurred in the past and are again recurring 2011     Past Surgical History: Nasal Septum repair- UP3 Coronary artery bypass graft 2000   percutaneous stenting of the distal right  coronary artery 11/2008                 Family History: Mother deceased at age 11, has history of heart disease, died during coronary artery bypass graft.  Father deceased at age 63 from unknown arrhythmia.  The patient has 4 brothers, all have diabetes, 1 has a history of cerebrovascular accident and 1 has prostate  cancer.    One of his brothers recently diagnosed to pancreatic cancer. Brother with recent diagnosis of CAD and CABG sheduled. Nephew with cardiac arrest  Family History of Prostate Cancer:brother    Brother - hypothyroidism       Social History: The patient is married, has 4 daughters, 1 daughter lives with the patient.  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    Never Smoked     Alcohol use-no     Illicit Drug Use - no      Daily Caffeine Use coffee  in the AM     Review of Systems  The patient denies melena, hematochezia, and severe indigestion/heartburn.    Physical Exam  General:  alert, well-developed, and well-nourished.   Head:  normocephalic and atraumatic.   Mouth:  pharynx pink and moist.   Lungs:  normal respiratory effort, normal breath sounds, no crackles, and no wheezes.   Heart:  normal rate, regular rhythm, and no gallop.   Abdomen:  soft, non-tender, normal bowel sounds, no masses, no hepatomegaly, and no splenomegaly.   Extremities:  No lower extremity edema Neurologic:  cranial nerves II-XII intact and gait normal.     Impression & Recommendations:  Problem # 1:  FATIGUE (ICD-780.79) pt having unexplained fatigue pt occ taking short acting insulin after meals pt advised not to use short acting insulin to "chase" elevated blood due to risk of hypoglycemia  Problem # 2:  CHEST PAIN UNSPECIFIED (ICD-786.50) follow up with cardiology [place] (cardiac enzymes) reported normal  EKG shows stable right bundle branch block  Problem # 3:  HYPERTENSION (ICD-401.9) Assessment: Unchanged  His updated medication list for this problem includes:    Bystolic 10 Mg Tabs (Nebivolol hcl) .Marland Kitchen... Take 1 tablet by mouth once a day  BP today: 126/60 Prior BP: 110/70 (11/25/2010)  Labs Reviewed: K+: 4.5 (11/25/2010) Creat: : 1.03 (11/25/2010)   Chol: 88 (06/24/2010)   HDL: 30 (06/24/2010)   LDL: 40 (06/24/2010)   TG: 90 (06/24/2010)  Complete Medication List: 1)  Lantus Solostar 100 Unit/ml Soln (Insulin glargine) .... Inject 20 - 25 units daily 2)  Metformin Hcl 1000 Mg Tabs (Metformin hcl) .... Take 1/2 tablet by mouth two times a day 3)  Simvastatin 40 Mg Tabs (Simvastatin) .... Take 1 tablet by mouth at bedtime 4)  Gabapentin 300 Mg Caps (Gabapentin) .Marland Kitchen.. 1 by mouth by mouth qpm 5)  Sertraline Hcl 100 Mg Tabs (Sertraline hcl) .... One by mouth two times a day 6)  Imdur  60 Mg Tb24 (Isosorbide mononitrate) .Marland Kitchen.. 1 tablet by mouth once daily 7)  Fish Oil 1000 Mg Caps (Omega-3 fatty acids) .... Take two gel caps two times a day 8)  Nitrostat 0.4 Mg Subl (Nitroglycerin) .Marland Kitchen.. 1 tablet under tongue at onset of chest pain; you may repeat every 5 minutes for up to 3 doses. 9)  Voltaren 1 % Gel (Diclofenac sodium) .... Apply 2 grams qid as needed 10)  Pramipexole Dihydrochloride 0.25 Mg Tabs (Pramipexole dihydrochloride) .... One by mouth qhs 11)  Aspirin 81 Mg Tbec (Aspirin) .... Take one tablet by mouth daily 12)  Novolog Flexpen 100 Unit/ml Soln (Insulin aspart) .... Use 5-10 units as directed three times a day 13)  Triamcinolone Acetonide 0.1 % Oint (Triamcinolone acetonide) .... Apply two times a day to dry hands 14)  Ferrous Sulfate 325 (65 Fe) Mg Tabs (Ferrous sulfate) .... Two times a day  15)  Pantoprazole Sodium 40 Mg Tbec (Pantoprazole sodium) .Marland Kitchen.. 1 by mouth once daily 16)  Novofine 30g X 8 Mm Misc (Insulin pen needle) .... Use for insulin injection three times a day 17)  Carafate 1 Gm/75ml Susp (Sucralfate) .... One tbsp qid 18)  Plavix 75 Mg Tabs (Clopidogrel bisulfate) .... Take 1 tablet by mouth once a day 19)  Bystolic 10 Mg Tabs (Nebivolol hcl) .... Take 1 tablet by mouth once a day 20)  Levothyroxine Sodium 75 Mcg Tabs (Levothyroxine sodium) .... One by mouth once daily 21)  Freestyle Lite Test Strp (Glucose blood) .... Use to test blood test blood sugar three times a day 22)  Zofran 8 Mg Tabs (Ondansetron hcl) .... Take 1 tablet by mouth two times a day 23)  Tramadol Hcl 50 Mg Tabs (Tramadol hcl) .... One by mouth two times a day as needed  Other Orders: UA Dipstick w/o Micro (manual) [id number])  Patient Instructions: 1)  Keep your appointment in March, 2012 2)  Do not use short acting insulin at bedtime or after meals   Orders Added: 1)  UA Dipstick w/o Micro (manual) [81002] 2)  Est. Patient Level IV [id number]    Current Allergies (reviewed  today): ! MORPHINE    Laboratory Results   Urine Tests    Routine Urinalysis   Color: straw Appearance: Clear Glucose: 250   (Normal Range: Negative) Bilirubin: negative   (Normal Range: Negative) Ketone: trace (5)   (Normal Range: Negative) Spec. Gravity: >=1.030   (Normal Range: 1.003-1.035) Blood: negative   (Normal Range: Negative) pH: 5.0   (Normal Range: 5.0-8.0) Protein: 30   (Normal Range: Negative) Urobilinogen: 0.2   (Normal Range: 0-1) Nitrite: negative   (Normal Range: Negative) Leukocyte Esterace: negative   (Normal Range: Negative)

## 2011-01-07 NOTE — Assessment & Plan Note (Signed)
Summary: rib & back pain  /hea   Vital Signs:  Patient profile:   65 year old male Height:      69 inches Weight:      186.50 pounds BMI:     27.64 O2 Sat:      97 % on Room air Temp:     97.6 degrees F oral Pulse rate:   55 / minute Resp:     16 per minute BP sitting:   110 / 60  (left arm) Cuff size:   large  Vitals Entered By: Jiles Garter CMA (November 06, 2010 2:15 PM)  O2 Flow:  Room air CC: Left side Abdominal pain Is Patient Diabetic? Yes Pain Assessment Patient in pain? yes     Location: abdomen & back Intensity: 10 Type: sharp Onset of pain  With activity   Primary Care Dessire Grimes:  [name] DO  CC:  Left side Abdominal pain.  History of Present Illness: 65 y/o c/o severe left sided rib pain- pt tried to pick up heavy piece of furnitureand heard a popping noise  .  He was seen in ER for radiating rib pain.  x ray was negative for rib fracture pt having pain with deep inhalation.  no sob.  no cough   Allergies: 1)  ! Morphine  Past History:  Past Medical History: Diabetes Mellitus Type II - uncontrolled. CAD.....Marland Kitchenstatus post coronary artery bypass graft in 2000, status post cardiac catheterization in 2006,     2009..continued chest pain and shortness of breath despite oral medication adjustments including ranexa.     Catheter November 2009/...DES Stent..11/26/2008 to distal RCA leading to acute marginal.  /  cath..01/26/2010..continued patentcy of grafts and stent to RCA.Marland KitchenMarland KitchenModest progression of distal disease...medical Rx  /  [facility]  Apr 23, 2010... no MI... home.. Diovan stopped and into her dose reduced for relative low blood pressure CABG..2000    Obstructive sleep apnea.  I do not know if it is possible if this is playing any role in his overall general fatigue. Depression.  He is on sertraline for this.   Hyperlipidemia and he is tolerating simvastatin.   Good left ventricular function.   Palpitations.  He had sinus rhythm when he wore his  event  recorder... Renal stones. Rrestless legs. ..on dopamine agonist. RBBB Depression / Bipolar  Median sternotomy suture... one is broken... noted on chest x-ray November, 2010... no clinical significance Dizziness Edema Hypertension SOB....cardiopulmonary exercise test... March 12, 2010... mild functional limitation.... no clear pulmonary or cardiac limitation.... possible deconditioning and mild chronotropic incompetence...( peak heart rate 130) Spondylosis.Marland KitchenMarland KitchenC5-6,C6-7  MRI...10/15/2009 MRI  Head....chronic microvascular ischemia....10/2009 Anemia..  hemoglobin 7.4.Marland KitchenMarland KitchenMarland KitchenMarland Kitchen iron deficiency... January, 2011.... endoscopy normal....2 unit transfusion in hospital  /  capsule endoscopy..February, 2011.... no small bowel abnormalities.... most likely source of bleeding was the gastric erosions..... meds adjusted.... patient to see Dr.Patterson soon Recommendation for disability   ... May 21, 2010 Falling spells    these have occurred in the past and are again recurring 2011     Past Surgical History: Nasal Septum repair- UP3 Coronary artery bypass graft 2000  percutaneous stenting of the distal right  coronary artery 11/2008               Family History: Mother deceased at age 60, has history of heart disease, died during coronary artery bypass graft.  Father deceased at age 4 from unknown arrhythmia.  The patient has 4 brothers, all have diabetes, 1 has a history of  cerebrovascular accident and 1 has prostate cancer.    One of his brothers recently diagnosed to pancreatic cancer. Brother with recent diagnosis of CAD and CABG sheduled. Nephew with cardiac arrest  Family History of Prostate Cancer:brother    Brother - hypothyroidism      Social History: The patient is married, has 4 daughters, 1 daughter lives with the patient.  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    Never Smoked    Alcohol use-no     Illicit Drug Use - no      Daily Caffeine Use coffee in the AM      Physical Exam  General:  alert, well-developed, and well-nourished.   Chest Wall:  left lower rib tenderness,  no bruising Lungs:  normal respiratory effort and normal breath sounds.   Heart:  normal rate, regular rhythm, and no gallop.     Impression & Recommendations:  Problem # 1:  RIB PAIN, LEFT SIDED (ICD-786.50) Assessment New probable strain of left intercostal muscles use muscle relaxer as directed  we discussed risk of pna with splinting Patient understands he will need re evaluation if he develops signs and symptoms of pna  Complete Medication List: 1)  Lantus Solostar 100 Unit/ml Soln (Insulin glargine) .... Inject 20 - 25 units daily 2)  Metformin Hcl 1000 Mg Tabs (Metformin hcl) .... Take 1/2 tablet by mouth two times a day 3)  Simvastatin 40 Mg Tabs (Simvastatin) .... Take 1 tablet by mouth at bedtime 4)  Gabapentin 300 Mg Caps (Gabapentin) .Marland Kitchen.. 1 by mouth by mouth qpm 5)  Sertraline Hcl 100 Mg Tabs (Sertraline hcl) .... One by mouth two times a day 6)  Imdur 60 Mg Tb24 (Isosorbide mononitrate) .Marland Kitchen.. 1 tablet by mouth once daily 7)  Fish Oil 1000 Mg Caps (Omega-3 fatty acids) .... Take two gel caps two times a day 8)  Nitrostat 0.4 Mg Subl (Nitroglycerin) .Marland Kitchen.. 1 tablet under tongue at onset of chest pain; you may repeat every 5 minutes for up to 3 doses. 9)  Voltaren 1 % Gel (Diclofenac sodium) .... Apply 2 grams qid as needed 10)  Pramipexole Dihydrochloride 0.25 Mg Tabs (Pramipexole dihydrochloride) .... One by mouth qhs 11)  Aspirin 81 Mg Tbec (Aspirin) .... Take one tablet by mouth daily 12)  Novolog Flexpen 100 Unit/ml Soln (Insulin aspart) .... Use 5-10 units as directed three times a day 13)  Triamcinolone Acetonide 0.1 % Oint (Triamcinolone acetonide) .... Apply two times a day to dry hands 14)  Ferrous Sulfate 325 (65 Fe) Mg Tabs (Ferrous sulfate) .... Two times a day 15)  Pantoprazole Sodium 40 Mg Tbec (Pantoprazole sodium) .Marland Kitchen.. 1 by mouth once daily 16)   Novofine 30g X 8 Mm Misc (Insulin pen needle) .... Use for insulin injection three times a day 17)  Carafate 1 Gm/81ml Susp (Sucralfate) .... One tbsp qid 18)  Plavix 75 Mg Tabs (Clopidogrel bisulfate) .... Take 1 tablet by mouth once a day 19)  Bystolic 10 Mg Tabs (Nebivolol hcl) .... Take 1 tablet by mouth once a day 20)  Levothyroxine Sodium 50 Mcg Tabs (Levothyroxine sodium) .... One by mouth once daily 21)  Freestyle Lite Test Strp (Glucose blood) .... Use to test blood test blood sugar three times a day 22)  Zofran 8 Mg Tabs (Ondansetron hcl) .... Take 1 tablet by mouth two times a day 23)  Tramadol Hcl 50 Mg Tabs (Tramadol hcl) .... One by mouth two times a day as needed  Patient Instructions: 1)  Call our office if your symptoms do not  improve or gets worse. Prescriptions: NOVOLOG FLEXPEN 100 UNIT/ML SOLN (INSULIN ASPART) use 5-10 units as directed three times a day  #1 month x 3   Entered and Authorized by:   D. Drema Pry DO   Signed by:   D. Drema Pry DO on 11/06/2010   Method used:   Electronically to        [facility] (retail)       [address]       [place], [place]  [address]       Ph: [phone] or [phone]       Fax: [phone]   RxID:   [id number] CYCLOBENZAPRINE HCL 5 MG TABS (CYCLOBENZAPRINE HCL) one by mouth three times a day as needed  #30 x 1   Entered and Authorized by:   D. Drema Pry DO   Signed by:   D. Drema Pry DO on 11/06/2010   Method used:   Electronically to        [facility] (retail)       [address]       [place], [place]  [address]       Ph: [phone] or [phone]       Fax: [phone]   RxID:   [id number]    Orders Added: 1)  Est. Patient Level III [id number]    Current Allergies (reviewed today): ! MORPHINE

## 2011-01-07 NOTE — Progress Notes (Signed)
Summary: Change meds  Medications Added PANTOPRAZOLE SODIUM 40 MG TBEC (PANTOPRAZOLE SODIUM) 1 by mouth once daily       Phone Note From Pharmacy   Caller: [facility]  Chanda Busing [phone]* Summary of Call: Pharmacy requesting change of med.  Aciphex not covered.  Pt can get omeprazole of pantoprazole at a big savings to pt.  Discussed with pt and he would like to change to pantoprazole.  Took protonix in the past and it worked well. Initial call taken by: Alberteen Spindle RN,  Apr 17, 2010 2:58 PM  Follow-up for Phone Call        Rx sent. Follow-up by: Alberteen Spindle RN,  Apr 17, 2010 2:59 PM    New/Updated Medications: PANTOPRAZOLE SODIUM 40 MG TBEC (PANTOPRAZOLE SODIUM) 1 by mouth once daily Prescriptions: PANTOPRAZOLE SODIUM 40 MG TBEC (PANTOPRAZOLE SODIUM) 1 by mouth once daily  #30 x 11   Entered by:   Alberteen Spindle RN   Authorized by:   Sable Feil MD [facility]   Signed by:   Alberteen Spindle RN on 04/17/2010   Method used:   Electronically to        [facility] [phone]* (retail)       [address]       [place], [place]  [address]       Ph: [phone] or [phone]       Fax: [phone]   RxID:   [id number]

## 2011-01-07 NOTE — Progress Notes (Signed)
Summary: disability forms   ---- Converted from flag ---- ---- 06/10/2010 11:58 AM, Guadlupe Spanish wrote: Rec'd signed FMLA papers from Dr. [name] 7/6. calling pt to come p/u as requested pas ------------------------------ Kevan Rosebush, RN  June 10, 2010 12:10 PM

## 2011-01-07 NOTE — Miscellaneous (Signed)
  Clinical Lists Changes  Observations: Added new observation of PAST MED HX: Diabetes Mellitus Type II - uncontrolled. CAD.....Marland Kitchenstatus post coronary artery bypass graft in 2000, status post cardiac catheterization in 2006,     2009..continued chest pain and shortness of breath despite oral medication adjustments including ranexa.     Catheter November 2009/...DES Stent..11/26/2008 to distal RCA leading to acute marginal.  /  cath..01/26/2010..continued patentcy of grafts and stent to RCA.Marland KitchenMarland KitchenModest progression of distal disease...medical Rx  CABG..2000 Obstructive sleep apnea.  I do not know if it is possible if this is playing any role in his overall general fatigue. Depression.  He is on sertraline for this.   Hyperlipidemia and he is tolerating simvastatin.   Good left ventricular function.  Palpitations.  He had sinus rhythm when he wore his  event recorder.  Renal stones. Rrestless legs. ..possible RBBB Depression / Bipolar  Median sternotomy suture... one is broken... noted on chest x-ray November, 2010... no clinical significance Dizziness Edema Hypertension SOB....cardiopulmonary exercise test... March 12, 2010... mild functional limitation.... no clear pulmonary or cardiac limitation.... possible deconditioning and mild chronotropic incompetence...( peak heart rate 130) Spondylosis.Marland KitchenMarland KitchenC5-6,C6-7  MRI...10/15/2009 MRI  Head....chronic microvascular ischemia....10/2009 Anemia..  hemoglobin 7.4.Marland KitchenMarland KitchenMarland KitchenMarland Kitchen iron deficiency... January, 2011.... endoscopy normal....2 unit transfusion in hospital  /  capsule endoscopy..February, 2011.... no small bowel abnormalities.... most likely source of bleeding was the gastric erosions..... meds adjusted.... patient to see Dr.Patterson soon   (04/09/2010 10:30) Added new observation of REFERRING MD: Verl Blalock (04/09/2010 10:30) Added new observation of PRIMARY MD: [name] DO (04/09/2010 10:30)       Past History:  Past Medical  History: Diabetes Mellitus Type II - uncontrolled. CAD.....Marland Kitchenstatus post coronary artery bypass graft in 2000, status post cardiac catheterization in 2006,     2009..continued chest pain and shortness of breath despite oral medication adjustments including ranexa.     Catheter November 2009/...DES Stent..11/26/2008 to distal RCA leading to acute marginal.  /  cath..01/26/2010..continued patentcy of grafts and stent to RCA.Marland KitchenMarland KitchenModest progression of distal disease...medical Rx  CABG..2000 Obstructive sleep apnea.  I do not know if it is possible if this is playing any role in his overall general fatigue. Depression.  He is on sertraline for this.   Hyperlipidemia and he is tolerating simvastatin.   Good left ventricular function.  Palpitations.  He had sinus rhythm when he wore his  event recorder.  Renal stones. Rrestless legs. ..possible RBBB Depression / Bipolar  Median sternotomy suture... one is broken... noted on chest x-ray November, 2010... no clinical significance Dizziness Edema Hypertension SOB....cardiopulmonary exercise test... March 12, 2010... mild functional limitation.... no clear pulmonary or cardiac limitation.... possible deconditioning and mild chronotropic incompetence...( peak heart rate 130) Spondylosis.Marland KitchenMarland KitchenC5-6,C6-7  MRI...10/15/2009 MRI  Head....chronic microvascular ischemia....10/2009 Anemia..  hemoglobin 7.4.Marland KitchenMarland KitchenMarland KitchenMarland Kitchen iron deficiency... January, 2011.... endoscopy normal....2 unit transfusion in hospital  /  capsule endoscopy..February, 2011.... no small bowel abnormalities.... most likely source of bleeding was the gastric erosions..... meds adjusted.... patient to see Dr.Patterson soon

## 2011-01-07 NOTE — Letter (Signed)
Summary: [facility]  [facility]   Imported By: Bubba Hales 07/22/2010 09:32:22  _____________________________________________________________________  External Attachment:    Type:   Image     Comment:   External Document

## 2011-01-07 NOTE — Progress Notes (Signed)
Summary: speak to nurse   Phone Note Call from Patient Call back at Home Phone [phone]   Caller: Spouse Reason for Call: Talk to Nurse Summary of Call: request to speak to nurse Initial call taken by: Darnell Level,  May 25, 2010 1:33 PM  Follow-up for Phone Call        spoke w/pts wife she will drop off disability forms  Follow-up by: Kevan Rosebush, RN,  May 25, 2010 2:18 PM

## 2011-01-07 NOTE — Progress Notes (Signed)
  Phone Note Call from Patient   Complaint: Chest Pain Summary of Call: pt notes worsening mood since lower dose.   pt advised to resume prev dose Initial call taken by: D. Drema Pry DO,  September 08, 2010 11:24 AM    New/Updated Medications: SERTRALINE HCL 100 MG  TABS (SERTRALINE HCL) one by mouth two times a day Prescriptions: SERTRALINE HCL 100 MG  TABS (SERTRALINE HCL) one by mouth two times a day  #180 x 1   Entered and Authorized by:   D. Drema Pry DO   Signed by:   D. Drema Pry DO on 09/08/2010   Method used:   Electronically to        [id number] (retail)       [address]       [place], [place]  [address]       Ph: [phone] or [phone]       Fax: [phone]   RxID:   [id number]   Appended Document: blood sugar readings    Phone Note Outgoing Call   Summary of Call: call pt - if he is having low blood sugars.  stop taking glimepiride Initial call taken by: D. Drema Pry DO,  September 09, 2010 8:55 AM  Follow-up for Phone Call        Left message on machine to return my call with blood sugar status. Gilmore Laroche Fergerson CMA [facility])  September 09, 2010 9:14 AM   Additional Follow-up for Phone Call Additional follow up Details #1::        Pt returned my call and stated he has had some low blood sugar episodes since he last saw [place]. Advised pt per Dr. Lora Havens instruction to stop Glimepiride. Pt states he has been following a more healthy diet and his blood sugar average has come down to 146.  Gilmore Laroche Fergerson CMA ([place])  September 09, 2010 11:22 AM

## 2011-01-07 NOTE — Assessment & Plan Note (Signed)
Summary: CPX Orma Flaming   Vital Signs:  Patient profile:   65 year old male Height:      69 inches Weight:      181.25 pounds BMI:     26.86 O2 Sat:      97 % on Room air Temp:     97.9 degrees F oral Pulse rate:   65 / minute Resp:     18 per minute BP sitting:   110 / 70  (left arm) Cuff size:   large  Vitals Entered By: Jiles Garter CMA (November 25, 2010 8:27 AM)  O2 Flow:  Room air CC: CPX Is Patient Diabetic? Yes Pain Assessment Patient in pain? no      Comments Fasting for blood work   Primary Care Provider:  D. Drema Pry DO  CC:  CPX.  History of Present Illness: 65 y/o white male for routine cpx adult health maintenance protocols reviewed  FLU VAX          Every 12 months         09/22/2010  Historical Due On: 09/23/2011  TD BOOSTER       Every 10 years          12/18/2007  given      Due On: 12/17/2017  ZOSTAVAX         At Age 18 years         11/25/2010  Zostavax   Done  PSA              Every 12 months         04/05/2008  1.48       Due Now   or PSA OFFERED                                        COLONOSCOPY      Every 10 years          02/10/2009  Location.Marland KitchenMarland KitchenDue On: 02/11/2019  COLONNXTDUE      At Age 70 years         02/10/2009  02/2014    Done  FLEX SIGMOID     Every 5 years                                  Due Now  HEMOCCULT        Every 12 months                                Due Now  CHOLESTEROL      Every 5 years           06/24/2010  88         Due On: 06/25/2015  Preventive Screening-Counseling & Management  Alcohol-Tobacco     Alcohol drinks/day: 0     Smoking Status: never  Caffeine-Diet-Exercise     Caffeine use/day: None     Does Patient Exercise: yes     Type of exercise: walking     Times/week: 7  Allergies: 1)  ! Morphine  Past History:  Past Medical History: Diabetes Mellitus Type II - uncontrolled. CAD.....Marland Kitchenstatus post coronary artery bypass graft in 2000, status post cardiac catheterization in 2006,     2009..continued  chest pain and shortness of breath  despite oral medication adjustments including ranexa.     Catheter November 2009/...DES Stent..11/26/2008 to distal RCA leading to acute marginal.  /  cath..01/26/2010..continued patentcy of grafts and stent to RCA.Marland KitchenMarland KitchenModest progression of distal disease...medical Rx  /  [facility]  Apr 23, 2010... no MI... home.. Diovan stopped and into her dose reduced for relative low blood pressure CABG..2000     Obstructive sleep apnea.  I do not know if it is possible if this is playing any role in his overall general fatigue. Depression.  He is on sertraline for this.   Hyperlipidemia and he is tolerating simvastatin.   Good left ventricular function.   Palpitations.  He had sinus rhythm when he wore his  event recorder... Renal stones. Rrestless legs. ..on dopamine agonist. RBBB Depression / Bipolar  Median sternotomy suture... one is broken... noted on chest x-ray November, 2010... no clinical significance Dizziness Edema Hypertension SOB....cardiopulmonary exercise test... March 12, 2010... mild functional limitation.... no clear pulmonary or cardiac limitation.... possible deconditioning and mild chronotropic incompetence...( peak heart rate 130) Spondylosis.Marland KitchenMarland KitchenC5-6,C6-7  MRI...10/15/2009 MRI  Head....chronic microvascular ischemia....10/2009 Anemia..  hemoglobin 7.4.Marland KitchenMarland KitchenMarland KitchenMarland Kitchen iron deficiency... January, 2011.... endoscopy normal....2 unit transfusion in hospital  /  capsule endoscopy..February, 2011.... no small bowel abnormalities.... most likely source of bleeding was the gastric erosions..... meds adjusted.... patient to see Dr.Patterson soon Recommendation for disability   ... May 21, 2010 Falling spells    these have occurred in the past and are again recurring 2011     Past Surgical History: Nasal Septum repair- UP3 Coronary artery bypass graft 2000  percutaneous stenting of the distal right  coronary artery 11/2008                 Social History: Caffeine  use/day:  None  Review of Systems  The patient denies fever, vision loss, prolonged cough, abdominal pain, severe indigestion/heartburn, and depression.    Physical Exam  General:  alert, well-developed, and well-nourished.   Head:  normocephalic and atraumatic.   Ears:  R ear normal and L ear normal.   Mouth:  good dentition and pharynx pink and moist.   Neck:  No deformities, masses, or tenderness noted. Lungs:  normal respiratory effort and normal breath sounds.   Heart:  normal rate, regular rhythm, and no gallop.   Abdomen:  soft, non-tender, normal bowel sounds, no masses, no hepatomegaly, and no splenomegaly.   Rectal:  normal sphincter tone, no masses, and stool positive for occult blood.   Genitalia:  circumcised, no scrotal masses, and no testicular masses or atrophy.   Prostate:  no nodules, no asymmetry, and 1+ enlarged.   Extremities:  No lower extremity edema Neurologic:  cranial nerves II-XII intact and gait normal.    Diabetes Management Exam:    Foot Exam (with socks and/or shoes not present):       Inspection:          Left foot: normal          Right foot: normal       Nails:          Left foot: normal          Right foot: normal   Impression & Recommendations:  Problem # 1:  HEALTH MAINTENANCE EXAM (ICD-V70.0) Reviewed adult health maintenance protocols.  Orders: EKG w/ Interpretation (93000)  Colonoscopy: Location:  [place].   (02/10/2009) Td Booster: given (12/18/2007)   Flu Vax: Historical (09/22/2010)   Pneumovax: Pneumovax (12/18/2008) Chol: 88 (06/24/2010)  HDL: 30 (06/24/2010)   LDL: 40 (06/24/2010)   TG: 90 (06/24/2010) TSH: 8.183 (06/24/2010)   HgbA1C: 9.1 (08/20/2010)   PSA: 1.48 (04/05/2008) Next Colonoscopy due:: 02/2014 (02/10/2009)  Complete Medication List: 1)  Lantus Solostar 100 Unit/ml Soln (Insulin glargine) .... Inject 20 - 25 units daily 2)  Metformin Hcl 1000 Mg Tabs (Metformin hcl) .... Take 1/2 tablet by  mouth two times a day 3)  Simvastatin 40 Mg Tabs (Simvastatin) .... Take 1 tablet by mouth at bedtime 4)  Gabapentin 300 Mg Caps (Gabapentin) .Marland Kitchen.. 1 by mouth by mouth qpm 5)  Sertraline Hcl 100 Mg Tabs (Sertraline hcl) .... One by mouth two times a day 6)  Imdur 60 Mg Tb24 (Isosorbide mononitrate) .Marland Kitchen.. 1 tablet by mouth once daily 7)  Fish Oil 1000 Mg Caps (Omega-3 fatty acids) .... Take two gel caps two times a day 8)  Nitrostat 0.4 Mg Subl (Nitroglycerin) .Marland Kitchen.. 1 tablet under tongue at onset of chest pain; you may repeat every 5 minutes for up to 3 doses. 9)  Voltaren 1 % Gel (Diclofenac sodium) .... Apply 2 grams qid as needed 10)  Pramipexole Dihydrochloride 0.25 Mg Tabs (Pramipexole dihydrochloride) .... One by mouth qhs 11)  Aspirin 81 Mg Tbec (Aspirin) .... Take one tablet by mouth daily 12)  Novolog Flexpen 100 Unit/ml Soln (Insulin aspart) .... Use 5-10 units as directed three times a day 13)  Triamcinolone Acetonide 0.1 % Oint (Triamcinolone acetonide) .... Apply two times a day to dry hands 14)  Ferrous Sulfate 325 (65 Fe) Mg Tabs (Ferrous sulfate) .... Two times a day 15)  Pantoprazole Sodium 40 Mg Tbec (Pantoprazole sodium) .Marland Kitchen.. 1 by mouth once daily 16)  Novofine 30g X 8 Mm Misc (Insulin pen needle) .... Use for insulin injection three times a day 17)  Carafate 1 Gm/93ml Susp (Sucralfate) .... One tbsp qid 18)  Plavix 75 Mg Tabs (Clopidogrel bisulfate) .... Take 1 tablet by mouth once a day 19)  Bystolic 10 Mg Tabs (Nebivolol hcl) .... Take 1 tablet by mouth once a day 20)  Levothyroxine Sodium 50 Mcg Tabs (Levothyroxine sodium) .... One by mouth once daily 21)  Freestyle Lite Test Strp (Glucose blood) .... Use to test blood test blood sugar three times a day 22)  Zofran 8 Mg Tabs (Ondansetron hcl) .... Take 1 tablet by mouth two times a day 23)  Tramadol Hcl 50 Mg Tabs (Tramadol hcl) .... One by mouth two times a day as needed  Other Orders: T-TSH [id number]) T-CBC No Diff  (123456) T-Basic Metabolic Panel (99991111) T- Hemoglobin A1C [id number]) T-Urine Microalbumin w/creat. ratio [phone]) T-PSA [id number]) Zoster (Shingles) Vaccine Live [phone]) Admin 1st Vaccine [phone])   Orders Added: 1)  EKG w/ Interpretation [93000] 2)  T-TSH [id number] 3)  T-CBC No Diff [[id number]] 4)  T-Basic Metabolic Panel 0000000 5)  T- Hemoglobin A1C [83036-23375] 6)  T-Urine Microalbumin w/creat. ratio [[id number]] 7)  T-PSA [id number] 8)  Zoster (Shingles) Vaccine Live [90736] 9)  Admin 1st Vaccine [90471] 10)  Est. Patient age 81-64 [39]   Immunizations Administered:  Zostavax # 1:    Vaccine Type: Zostavax    Site: left arm    Mfr: Merck    Dose: 0.5 ml    Route: [place]    Given by: Kelle Darting CMA ([place])    Exp. Date: 07/24/2011    Lot #: [id number]    VIS given: 09/17/05 given November 25, 2010.   Immunizations Administered:  Zostavax # 1:  Vaccine Type: Zostavax    Site: left arm    Mfr: Merck    Dose: 0.5 ml    Route: [place]    Given by: Kelle Darting CMA ([place])    Exp. Date: 07/24/2011    Lot #: [id number]    VIS given: 09/17/05 given November 25, 2010.  Current Allergies (reviewed today): ! MORPHINE

## 2011-01-07 NOTE — Assessment & Plan Note (Signed)
Summary: EPH/ PER [facility]/ 7-10 DAYS/ GD  Medications Added ASPIRIN 81 MG TBEC (ASPIRIN) Take one tablet by mouth daily      Allergies Added:   Visit Type:  Follow-up Referring Provider:  Verl Blalock Primary Provider:  [name] DO  CC:  CAD.  History of Present Illness: The patient is seen for followup of coronary artery disease.  I saw him last in the office on Apr 23, 2010.  He was hospitalized Apr 23, 2010.  There is no MI.  He had dizziness earlier that day when he went for GI followup.  I was wondering if his dizziness could be related to low blood pressure.  With this he might become anxious and had some chest discomfort.  I therefore sent him home from the hospital but his Diovan on hold and Imdur were dose reduced.  Today the patient is here with his wife.  Unfortunately he continues to feel poorly in many regards.  He has some leg discomfort.  He has had some chest discomfort.  He thinks he sleeping adequately but his wife says that he is snoring heavily.  He does have a history of obstructive sleep apnea.  He is not able to use his CPAP from 8 years ago.  I will arrange for further evaluation to see if it were equipment and techniques can help him.  He has marked fatigue during the day and sleep apnea may be playing a role.  In addition he is having headaches again.  This has been evaluated previously without significant findings.  Patient has significant stress at his home with his children.  Also he works in an environment with significant heat.  He has no options in this regard.  Certainly he could be considered medically disabled.  Current Medications (verified): 1)  Glimepiride 2 Mg Tabs (Glimepiride) .... One By Mouth Once Daily 2)  Lantus Solostar 100 Unit/ml Soln (Insulin Glargine) .... Inject 28 - 35 Units Daily 3)  Metformin Hcl 1000 Mg  Tabs (Metformin Hcl) .... Take 1 Tablet By Mouth Two Times A Day 4)  Simvastatin 40 Mg  Tabs (Simvastatin) .... Take 1 Tablet By  Mouth At Bedtime 5)  Gabapentin 300 Mg  Caps (Gabapentin) .Marland Kitchen.. 1 By Mouth Two Times A Day 6)  Diovan 40 Mg  Tabs (Valsartan) .... Take 1 Tablet By Mouth Once A Day 7)  Sertraline Hcl 100 Mg  Tabs (Sertraline Hcl) .... Take 1 Tablet By Mouth Two Times A Day 8)  Imdur 60 Mg  Tb24 (Isosorbide Mononitrate) .Marland Kitchen.. 1 Tab Daily 9)  Fish Oil 1000 Mg Caps (Omega-3 Fatty Acids) .... Take Two Gel Caps Two Times A Day 10)  Nitroglycerin 0.4 Mg Subl (Nitroglycerin) .... Disolve Under Tounge As Needed 11)  Voltaren 1 % Gel (Diclofenac Sodium) .... Apply 2 Grams Qid 12)  Pramipexole Dihydrochloride 0.25 Mg Tabs (Pramipexole Dihydrochloride) .... One By Mouth Qhs 13)  Aspirin 81 Mg Tbec (Aspirin) .... Take One Tablet By Mouth Daily 14)  Novolog Penfill 100 Unit/ml Soln (Insulin Aspart) .... Inject 1-7 Units Subcutaneously Sliding Scale As Needed 15)  Triamcinolone Acetonide 0.1 % Oint (Triamcinolone Acetonide) .... Apply Two Times A Day To Dry Hands 16)  Ferrous Sulfate 325 (65 Fe) Mg  Tabs (Ferrous Sulfate) .... Two Times A Day 17)  Pantoprazole Sodium 40 Mg Tbec (Pantoprazole Sodium) .Marland Kitchen.. 1 By Mouth Once Daily 18)  Novofine 30g X 8 Mm Misc (Insulin Pen Needle) .... Use For Insulin Injection Three Times  A Day 19)  Carafate 1 Gm/7ml  Susp (Sucralfate) .... One Tbsp Qid 20)  Plavix 75 Mg Tabs (Clopidogrel Bisulfate) .... Take 1 Tablet By Mouth Once A Day 21)  Bystolic 10 Mg Tabs (Nebivolol Hcl) .... Take 1  Tab Daily As Directed  Allergies (verified): 1)  ! Morphine  Past History:  Past Medical History: Diabetes Mellitus Type II - uncontrolled. CAD.....Marland Kitchenstatus post coronary artery bypass graft in 2000, status post cardiac catheterization in 2006,     2009..continued chest pain and shortness of breath despite oral medication adjustments including ranexa.     Catheter November 2009/...DES Stent..11/26/2008 to distal RCA leading to acute marginal.  /  cath..01/26/2010..continued patentcy of grafts and stent to  RCA.Marland KitchenMarland KitchenModest progression of distal disease...medical Rx  /  [facility]  Apr 23, 2010... no MI... home.. Diovan stopped and into her dose reduced for relative low blood pressure CABG..2000 Obstructive sleep apnea.  I do not know if it is possible if this is playing any role in his overall general fatigue. Depression.  He is on sertraline for this.   Hyperlipidemia and he is tolerating simvastatin.   Good left ventricular function.  Palpitations.  He had sinus rhythm when he wore his  event recorder. Renal stones. Rrestless legs. ..possible RBBB Depression / Bipolar  Median sternotomy suture... one is broken... noted on chest x-ray November, 2010... no clinical significance Dizziness Edema Hypertension SOB....cardiopulmonary exercise test... March 12, 2010... mild functional limitation.... no clear pulmonary or cardiac limitation.... possible deconditioning and mild chronotropic incompetence...( peak heart rate 130) Spondylosis.Marland KitchenMarland KitchenC5-6,C6-7  MRI...10/15/2009 MRI  Head....chronic microvascular ischemia....10/2009 Anemia..  hemoglobin 7.4.Marland KitchenMarland KitchenMarland KitchenMarland Kitchen iron deficiency... January, 2011.... endoscopy normal....2 unit transfusion in hospital  /  capsule endoscopy..February, 2011.... no small bowel abnormalities.... most likely source of bleeding was the gastric erosions..... meds adjusted.... patient to see Dr.Patterson soon     Review of Systems       Patient denies fever, chills, rash, change in vision, change in hearing.  He has had some sweats.  He does not have any GI symptoms or GU symptoms.  All of the systems are reviewed and are negative.  Vital Signs:  Patient profile:   65 year old male Height:      69 inches Weight:      188 pounds BMI:     27.86 Pulse rate:   67 / minute BP sitting:   120 / 74  (left arm) Cuff size:   regular  Vitals Entered By: Lubertha Basque, CNA (May 08, 2010 1:56 PM)  Physical Exam  General:  patient is stable in general. Head:  head is atraumatic. Eyes:  no  xanthelasma. Neck:  no jugular venous distention. Chest Wall:  no chest wall tenderness. Lungs:  lungs are clear respiratory effort is nonlabored. Heart:  cardiac exam reveals S1-S2.  No clicks or significant murmurs. Abdomen:  abdomen is soft. Msk:  no musculoskeletal deformities. Extremities:  no peripheral edema. Skin:  no skin rashes. Psych:  patient is oriented to person time and place.  Affect is normal.   Impression & Recommendations:  Problem # 1:  * STRESS FROM HIS DAUGHTERS ILLNESS This continues to be a significant issue.  Problem # 2:  ANEMIA (N067566.9) Recent labs show that his hemoglobin is stable.  No labs dated today.  Problem # 3:  CORONARY ATHEROSCLEROSIS NATIVE CORONARY ARTERY (ICD-414.01)  His updated medication list for this problem includes:    Imdur 60 Mg Tb24 (Isosorbide mononitrate) .Marland Kitchen... 1 tab daily  Nitroglycerin 0.4 Mg Subl (Nitroglycerin) .Marland Kitchen... Disolve under tounge as needed    Aspirin 81 Mg Tbec (Aspirin) .Marland Kitchen... Take one tablet by mouth daily    Plavix 75 Mg Tabs (Clopidogrel bisulfate) .Marland Kitchen... Take 1 tablet by mouth once a day    Bystolic 10 Mg Tabs (Nebivolol hcl) .Marland Kitchen... Take 1  tab daily as directed Patient has severe coronary disease.  His recent catheter showed that he has distal disease.  Is not a candidate now for CABG at this point.  I believe that he is medically disabled.  He insists on continuing to work.  He did his place of work this very high.  I will not change his medicines today.EKG is done today and reviewed by me.  There is no significant change.  Problem # 4:  SHORTNESS OF BREATH (ICD-786.05)  His updated medication list for this problem includes:    Diovan 40 Mg Tabs (Valsartan) .Marland Kitchen... Take 1 tablet by mouth once a day    Aspirin 81 Mg Tbec (Aspirin) .Marland Kitchen... Take one tablet by mouth daily    Bystolic 10 Mg Tabs (Nebivolol hcl) .Marland Kitchen... Take 1  tab daily as directed We know from his cardiopulmonary exercise test that he does not have  severe impairment.  Problem # 5:  DIZZINESS (ICD-780.4) the dizziness is somewhat improved since we lowered some of his medicines.  Eventually when one him back on an ACE or an ARB because of his diabetes.  Problem # 6:  LEG PAIN, BILATERAL (ICD-729.5) His leg pain is not vascular.  Problem # 7:  HEADACHE (ICD-784.0)  His updated medication list for this problem includes:    Aspirin 81 Mg Tbec (Aspirin) .Marland Kitchen... Take one tablet by mouth daily    Bystolic 10 Mg Tabs (Nebivolol hcl) .Marland Kitchen... Take 1  tab daily as directed I do not know any other studies to help assess his headaches.  This has already been done.  Problem # 8:  OBSTRUCTIVE SLEEP APNEA (ICD-327.23)  I believe that his lack of sleep may be playing a role here.  I will arrange for him to have early followup for sleep apnea.  The device he has a years old.  I am hopeful that they were going to be family can help him.  Orders: Pulmonary Referral (Pulmonary)  Patient Instructions: 1)  Your physician recommends that you schedule a follow-up appointment  5weeks 2)  You have been referred to--please call [facility] and schedule with first available pulmonologist to do f/u sleep analysis 3)

## 2011-01-07 NOTE — Procedures (Signed)
Summary: EGD   EGD   Imported By: Sallee Provencal 04/03/2010 13:59:13  _____________________________________________________________________  External Attachment:    Type:   Image     Comment:   External Document

## 2011-01-07 NOTE — Assessment & Plan Note (Signed)
Summary: eph/ post cath/ gd      Allergies Added:   Visit Type:  Follow-up Referring Provider:  Verl Blalock Primary Provider:  [name] DO  CC:  CAD.  History of Present Illness: The patient returns for followup of his coronary disease.  I saw him last week decided to proceed with cardiac catheterization.  This study was done February 21, 20110. There is continued patency of his grafts.  There is modest progression of distal disease.  Medical therapy is recommended.  The patient continues to have significant exertional fatigue and shortness of breath.  The exact etiology is not clear.  Current Medications (verified): 1)  Glimepiride 2 Mg Tabs (Glimepiride) .... One By Mouth Once Daily 2)  Lantus Solostar 100 Unit/ml Soln (Insulin Glargine) .... Inject 28 - 35 Units Daily 3)  Metformin Hcl 1000 Mg  Tabs (Metformin Hcl) .... Take 1 Tablet By Mouth Two Times A Day 4)  Simvastatin 40 Mg  Tabs (Simvastatin) .... Take 1 Tablet By Mouth At Bedtime 5)  Gabapentin 300 Mg  Caps (Gabapentin) .Marland Kitchen.. 1 By Mouth Two Times A Day 6)  Diovan 40 Mg  Tabs (Valsartan) .... Take 1 Tablet By Mouth Once A Day 7)  Sertraline Hcl 100 Mg  Tabs (Sertraline Hcl) .... Take 1 Tablet By Mouth Two Times A Day 8)  Imdur 60 Mg  Tb24 (Isosorbide Mononitrate) .Marland Kitchen.. 1 Tab Daily 9)  Carvedilol 12.5 Mg Tabs (Carvedilol) .... Take One Tablet By Mouth Twice A Day 10)  Fish Oil 1000 Mg Caps (Omega-3 Fatty Acids) .... Take Two Gel Caps Two Times A Day 11)  Nitroglycerin 0.4 Mg Subl (Nitroglycerin) .... Disolve Under Tounge As Needed 12)  Voltaren 1 % Gel (Diclofenac Sodium) .... Apply 2 Grams Qid 13)  Pramipexole Dihydrochloride 0.25 Mg Tabs (Pramipexole Dihydrochloride) .... One By Mouth Qhs 14)  Vicodin 5-500 Mg Tabs (Hydrocodone-Acetaminophen) .... Take 1 Tablet By Mouth Two Times A Day As Needed 15)  Aspirin 325 Mg  Tabs (Aspirin) .Marland Kitchen.. 1 By Mouth Daily 16)  Novolog Penfill 100 Unit/ml Soln (Insulin Aspart) ....  Inject 1-7 Units Subcutaneously Sliding Scale As Needed 17)  Metaxalone 800 Mg Tabs (Metaxalone) .... One By Mouth At Bedtime As Needed 18)  Triamcinolone Acetonide 0.1 % Oint (Triamcinolone Acetonide) .... Apply Two Times A Day To Dry Hands 19)  Ferrous Sulfate 325 (65 Fe) Mg  Tabs (Ferrous Sulfate) .... Two Times A Day 20)  Aciphex 20 Mg  Tbec (Rabeprazole Sodium) .... Take 1 Tablet By Mouth Two Times A Day 21)  Novofine 30g X 8 Mm Misc (Insulin Pen Needle) .... Use For Insulin Injection Three Times A Day 22)  Carafate 1 Gm/19ml  Susp (Sucralfate) .... One Tbsp Qid  Allergies (verified): 1)  ! Morphine  Past History:  Past Medical History: Last updated: 02/16/2010 Diabetes Mellitus Type II - uncontrolled. CAD.....Marland Kitchenstatus post coronary artery bypass graft in 2000, status post cardiac catheterization in 2006,     2009..continued chest pain and shortness of breath despite oral medication adjustments including ranexa.     Catheter November 2009/...DES Stent..11/26/2008 to distal RCA leading to acute marginal.  /  cath..01/26/2010..continued patentcy of grafts and stent to RCA.Marland KitchenMarland KitchenModest progression of distal disease...medical Rx CABG..2000 Obstructive sleep apnea.  I do not know if it is possible if this is playing any role in his overall general fatigue. Depression.  He is on sertraline for this.   Hyperlipidemia and he is tolerating simvastatin.  Good left ventricular function.  Palpitations.  He had sinus rhythm when he wore his  event recorder.  Renal stones. Rrestless legs. ..possible RBBB Depression / Bipolar  Median sternotomy suture... one is broken... noted on chest x-ray November, 2010... no clinical significance Dizziness Edema Hypertension SOB Spondylosis.Marland KitchenMarland KitchenC5-6,C6-7  MRI...10/15/2009 MRI  Head....chronic microvascular ischemia....10/2009 Anemia..  hemoglobin 7.4.Marland KitchenMarland KitchenMarland KitchenMarland Kitchen iron deficiency... January, 2011.... endoscopy normal....2 unit transfusion in hospital  /  capsule  endoscopy..February, 2011.... no small bowel abnormalities.... most likely source of bleeding was the gastric erosions..... meds adjusted.... patient to see Dr.Patterson soon    Review of Systems       The patient denies fevers, chills.  He has significant ongoing headaches.  There no rashes.  He's not had any sweats.  Is not having chest pain.  He does have shortness of breath.  There is no cough.  He has no nausea or vomiting.  There no urinary symptoms.  All other systems are reviewed and are negative.  Vital Signs:  Patient profile:   65 year old male Height:      69 inches Weight:      191 pounds BMI:     28.31 Pulse rate:   70 / minute BP sitting:   110 / 56  (left arm) Cuff size:   regular  Vitals Entered By: Mignon Pine, RMA (February 17, 2010 11:06 AM)  Physical Exam  General:  Patient is stable today. Head:  head is atraumatic. Eyes:  no xanthelasma. Neck:  no jugular venous distention. Chest Wall:  no chest wall tenderness. Lungs:  lungs are clear.  Respiratory effort is nonlabored. Heart:  cardiac exam reveals S1 and S2.  No clicks or significant murmurs. Abdomen:  abdomen is soft. Msk:  no musculoskeletal deformities.  Patient does have a small bruise on his leg.  This is spontaneous. Extremities:  no peripheral edema. Skin:  no skin rashes. Psych:  patient is oriented to person time and place affect is normal.   Impression & Recommendations:  Problem # 1:  ANEMIA (ICD-285.9) The patient's hemoglobin was checked recently.  I reviewed the lab and his hemoglobin is 13.  He continues to improve.  Problem # 2:  CORONARY ATHEROSCLEROSIS NATIVE CORONARY ARTERY (ICD-414.01) patient has significant coronary disease.  I have reviewed all the data concerning his catheter report.  As noted he has patent grafts.  He does have some distal progression of his disease.  I will not be changing his cardiac medicines as of today.  Problem # 3:  SHORTNESS OF BREATH  (ICD-786.05)  The patient continues to have significant exertional shortness of breath.  The etiology is not clear.  I will schedule a cardiopulmonary exercise test to see if we can obtain more information about this.  Orders: CPX Test at [facility] (CPX Test)  Problem # 4:  EDEMA (ICD-782.3) no significant edema today.  No change in meds.  Problem # 5:  DIZZINESS (ICD-780.4) He continues to have dizziness but it is stable at this time.  Problem # 6:  PALPITATIONS (ICD-785.1) he continues to have mild palpitations.  No change in therapy.  Problem # 7:  HEADACHE (ICD-784.0) The patient continues to have headaches.  This is being evaluated by his other physicians.  Problem # 8:  * STRESS FROM HIS DAUGHTERS ILLNESS The patient's daughter has seizures.  These are not completely diagnose at this time.  There is ongoing workup.  This certainly plays a role with his overall demeanor.  Patient Instructions: 1)  Your  physician has recommended that you have a cardiopulmonary stress test (CPX).  CPX testing is a non-invasive measurement of heart and lung function. It replaces a traditional treadmill stress test. This type of test provides a tremendous amount of information that relates not only to your present condition but also for future outcomes.  This test combines measurements of your ventilation, respiratory gas exchange in the lungs, electrocardiogram (EKG), blood pressure and physical response before, during, and following an exercise protocol. 2)  Follow up in 2 months

## 2011-01-07 NOTE — Progress Notes (Signed)
Summary: Metformin Refill  Phone Note Refill Request Message from:  Fax from Pharmacy on January 21, 2010 1:51 PM  Refills Requested: Medication #1:  METFORMIN HCL 1000 MG  TABS Take 1 tablet by mouth two times a day   Dosage confirmed as above?Dosage Confirmed   Brand Name Necessary? No   Supply Requested: 1 month   Last Refilled: 12/14/2009  Method Requested: Electronic Next Appointment Scheduled: 01-22-10 330 DR  kATZ Initial call taken by: Shanon Payor,  January 21, 2010 1:52 PM  Follow-up for Phone Call        Rx completed in Dr. Brantley Stage Follow-up by: Jiles Garter CMA,  January 21, 2010 4:35 PM    Prescriptions: METFORMIN HCL 1000 MG  TABS (METFORMIN HCL) Take 1 tablet by mouth two times a day  #60 x 3   Entered by:   Jiles Garter CMA   Authorized by:   D. Drema Pry DO   Signed by:   Jiles Garter CMA on 01/21/2010   Method used:   Electronically to        [facility] [phone]* (retail)       [address]       [place], [place]  [address]       Ph: [phone] or [phone]       Fax: [phone]   RxID:   [id number]

## 2011-01-07 NOTE — Progress Notes (Signed)
Summary: pt has concerns   Phone Note Call from Patient Call back at Home Phone [phone]   Caller: Patient [phone] Reason for Call: Talk to Nurse Summary of Call: pt would like to talk w/ [name] [name] has concerns Initial call taken by: Lorenda Hatchet,  May 20, 2010 2:30 PM  Follow-up for Phone Call        pt hurting all the time (chest pain), takes ntg all the time "like candy" he feels he stumbles all over b/c of dizziness, he feels very bad, imdur was cut back due to dizziness, pt unable to tolerate Ranexa in the past, saw Dr Shawna Orleans recently, will discuss w/Dr Soundra Pilon and call back [name] Rosebush, RN  May 20, 2010 2:54 PM   Additional Follow-up for Phone Call Additional follow up Details #1::        per Dr [name] bring in for appt today at 2:30, pt is aware and agreeable to come [facility], RN  May 21, 2010 8:25 AM

## 2011-01-07 NOTE — Assessment & Plan Note (Signed)
Summary: fatigue/dt   Vital Signs:  Patient profile:   65 year old male Height:      69 inches Weight:      187 pounds BMI:     27.71 O2 Sat:      99 % on Room air Temp:     97.8 degrees F oral Pulse rate:   67 / minute Pulse rhythm:   regular Resp:     18 per minute BP sitting:   110 / 60  (left arm) Cuff size:   large  Vitals Entered By: Jiles Garter CMA (May 11, 2010 3:38 PM)  O2 Flow:  Room air CC: Rm 2- Feels bad Comments no energy, severe headaches, chest pain, legs hurt all all the time, ongoing for the past several months   Primary Care Provider:  [name] DO  CC:  Rm 2- Feels bad.  History of Present Illness: 65 y/o white male with hx of CAD, DM II, and htn for follow up pt continues to have intermittent exertional chest pain at work. job is physically demanding difficult to perform job w/o experiencing chest pain and shortness of breath  DM II - denies severe hypoglyemia.  still occ enjoys sweets  Allergies: 1)  ! Morphine  Past History:  Past Medical History: Diabetes Mellitus Type II - uncontrolled. CAD.....Marland Kitchenstatus post coronary artery bypass graft in 2000, status post cardiac catheterization in 2006,     2009..continued chest pain and shortness of breath despite oral medication adjustments including ranexa.     Catheter November 2009/...DES Stent..11/26/2008 to distal RCA leading to acute marginal.  /  cath..01/26/2010..continued patentcy of grafts and stent to RCA.Marland KitchenMarland KitchenModest progression of distal disease...medical Rx  /  [facility]  Apr 23, 2010... no MI... home.. Diovan stopped and into her dose reduced for relative low blood pressure  CABG..2000 Obstructive sleep apnea.  I do not know if it is possible if this is playing any role in his overall general fatigue. Depression.  He is on sertraline for this.   Hyperlipidemia and he is tolerating simvastatin.    Good left ventricular function.  Palpitations.  He had sinus rhythm when he wore his  event  recorder. Renal stones. Rrestless legs. ..possible RBBB Depression / Bipolar  Median sternotomy suture... one is broken... noted on chest x-ray November, 2010... no clinical significance Dizziness Edema Hypertension SOB....cardiopulmonary exercise test... March 12, 2010... mild functional limitation.... no clear pulmonary or cardiac limitation.... possible deconditioning and mild chronotropic incompetence...( peak heart rate 130) Spondylosis.Marland KitchenMarland KitchenC5-6,C6-7  MRI...10/15/2009 MRI  Head....chronic microvascular ischemia....10/2009 Anemia..  hemoglobin 7.4.Marland KitchenMarland KitchenMarland KitchenMarland Kitchen iron deficiency... January, 2011.... endoscopy normal....2 unit transfusion in hospital  /  capsule endoscopy..February, 2011.... no small bowel abnormalities.... most likely source of bleeding was the gastric erosions..... meds adjusted.... patient to see Dr.Patterson soon     Past Surgical History: Nasal Septum repair Coronary artery bypass graft 2000   percutaneous stenting of the distal right  coronary artery 11/2008            Family History: Mother deceased at age 15, has history of heart disease, died during coronary artery bypass graft.  Father deceased at age 53 from unknown arrhythmia.  The patient has 4 brothers, all have diabetes, 1 has a history of cerebrovascular accident and 1 has prostate cancer.    One of his brothers recently diagnosed to pancreatic cancer. Brother with recent diagnosis of CAD and CABG sheduled. Nephew with cardiac arrest  Family History of Prostate Cancer:brother  Social History: The patient is married, has 4 daughters, 1 daughter lives with the patient.  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    Never Smoked   Alcohol use-no    Illicit Drug Use - no      Daily Caffeine Use coffee in the AM    i Physical Exam  General:  alert, well-developed, and well-nourished.   Neck:  supple and no masses.   Lungs:  normal respiratory effort and normal breath sounds.     Heart:  normal rate, regular rhythm, and no gallop.   Abdomen:  soft, non-tender, and normal bowel sounds.   Extremities:  No lower extremity edema  Neurologic:  cranial nerves II-XII intact and gait normal.   Psych:  normally interactive, good eye contact, and not depressed appearing.     Impression & Recommendations:  Problem # 1:  CHEST PAIN UNSPECIFIED (ICD-786.50) Assessment Unchanged pt having recurrent chest pain with exertion.  job is too physically demanding.  pt will likely need to take medical disability  Problem # 2:  DIABETES MELLITUS, TYPE II (ICD-250.00) pt advised to use short acting insulin pre meals / snacks to minimize hypoglycemia  The following medications were removed from the medication list:    Diovan 40 Mg Tabs (Valsartan) .Marland Kitchen... Take 1 tablet by mouth once a day His updated medication list for this problem includes:    Glimepiride 2 Mg Tabs (Glimepiride) ..... One by mouth once daily    Lantus Solostar 100 Unit/ml Soln (Insulin glargine) ..... Inject 28 - 35 units daily    Metformin Hcl 1000 Mg Tabs (Metformin hcl) .Marland Kitchen... Take 1 tablet by mouth two times a day    Aspirin 81 Mg Tbec (Aspirin) .Marland Kitchen... Take one tablet by mouth daily    Novolog Penfill 100 Unit/ml Soln (Insulin aspart) ..... Inject 1-7 units subcutaneously sliding scale as needed  Labs Reviewed: Creat: 1.03 (03/06/2010)    Reviewed HgBA1c results: 7.8 (03/06/2010)  7.9 (11/14/2009)  Problem # 3:  HYPERTENSION (ICD-401.9) Maintain current medication regimen.  The following medications were removed from the medication list:    Diovan 40 Mg Tabs (Valsartan) .Marland Kitchen... Take 1 tablet by mouth once a day His updated medication list for this problem includes:    Bystolic 10 Mg Tabs (Nebivolol hcl) .Marland Kitchen... Take 1  tab daily as directed  BP today: 110/60 Prior BP: 120/74 (05/08/2010)  Labs Reviewed: K+: 4.7 (03/06/2010) Creat: : 1.03 (03/06/2010)   Chol: 97 (06/05/2009)   HDL: 31.80 (06/05/2009)   LDL:  57 (06/05/2009)   TG: 42.0 (06/05/2009)  Complete Medication List: 1)  Glimepiride 2 Mg Tabs (Glimepiride) .... One by mouth once daily 2)  Lantus Solostar 100 Unit/ml Soln (Insulin glargine) .... Inject 28 - 35 units daily 3)  Metformin Hcl 1000 Mg Tabs (Metformin hcl) .... Take 1 tablet by mouth two times a day 4)  Simvastatin 40 Mg Tabs (Simvastatin) .... Take 1 tablet by mouth at bedtime 5)  Gabapentin 300 Mg Caps (Gabapentin) .Marland Kitchen.. 1 by mouth two times a day 6)  Sertraline Hcl 100 Mg Tabs (Sertraline hcl) .... Take 1 tablet by mouth two times a day 7)  Imdur 60 Mg Tb24 (Isosorbide mononitrate) .... 1/2 tablet by mouth once daily 8)  Fish Oil 1000 Mg Caps (Omega-3 fatty acids) .... Take two gel caps two times a day 9)  Nitroglycerin 0.4 Mg Subl (Nitroglycerin) .... Disolve under tounge as needed 10)  Voltaren 1 % Gel (Diclofenac sodium) .Marland KitchenMarland KitchenMarland Kitchen  Apply 2 grams qid as needed 11)  Pramipexole Dihydrochloride 0.25 Mg Tabs (Pramipexole dihydrochloride) .... One by mouth qhs 12)  Aspirin 81 Mg Tbec (Aspirin) .... Take one tablet by mouth daily 13)  Novolog Penfill 100 Unit/ml Soln (Insulin aspart) .... Inject 1-7 units subcutaneously sliding scale as needed 14)  Triamcinolone Acetonide 0.1 % Oint (Triamcinolone acetonide) .... Apply two times a day to dry hands 15)  Ferrous Sulfate 325 (65 Fe) Mg Tabs (Ferrous sulfate) .... Two times a day 16)  Pantoprazole Sodium 40 Mg Tbec (Pantoprazole sodium) .Marland Kitchen.. 1 by mouth once daily 17)  Novofine 30g X 8 Mm Misc (Insulin pen needle) .... Use for insulin injection three times a day 18)  Carafate 1 Gm/32ml Susp (Sucralfate) .... One tbsp qid 19)  Plavix 75 Mg Tabs (Clopidogrel bisulfate) .... Take 1 tablet by mouth once a day 20)  Bystolic 10 Mg Tabs (Nebivolol hcl) .... Take 1  tab daily as directed  Patient Instructions: 1)  Please schedule a follow-up appointment in 1 month. 2)  Bring blood sugar log  Current Allergies (reviewed today): ! MORPHINE

## 2011-01-07 NOTE — Miscellaneous (Signed)
  Clinical Lists Changes  Observations: Added new observation of PAST MED HX: Diabetes Mellitus Type II - uncontrolled. CAD.....Marland Kitchenstatus post coronary artery bypass graft in 2000, status post cardiac catheterization in 2006,     2009..continued chest pain and shortness of breath despite oral medication adjustments including ranexa.     Catheter November 2009/...DES Stent..11/26/2008 to distal RCA leading to acute marginal.  /  cath..01/26/2010..continued patentcy of grafts and stent to RCA.Marland KitchenMarland KitchenModest progression of distal disease...medical Rx  /  [facility]  Apr 23, 2010... no MI... home.. Diovan stopped and into her dose reduced for relative low blood pressure CABG..2000 Obstructive sleep apnea.  I do not know if it is possible if this is playing any role in his overall general fatigue. Depression.  He is on sertraline for this.   Hyperlipidemia and he is tolerating simvastatin.   Good left ventricular function.  Palpitations.  He had sinus rhythm when he wore his  event recorder.  Renal stones. Rrestless legs. ..possible RBBB Depression / Bipolar  Median sternotomy suture... one is broken... noted on chest x-ray November, 2010... no clinical significance Dizziness Edema Hypertension SOB....cardiopulmonary exercise test... March 12, 2010... mild functional limitation.... no clear pulmonary or cardiac limitation.... possible deconditioning and mild chronotropic incompetence...( peak heart rate 130) Spondylosis.Marland KitchenMarland KitchenC5-6,C6-7  MRI...10/15/2009 MRI  Head....chronic microvascular ischemia....10/2009 Anemia..  hemoglobin 7.4.Marland KitchenMarland KitchenMarland KitchenMarland Kitchen iron deficiency... January, 2011.... endoscopy normal....2 unit transfusion in hospital  /  capsule endoscopy..February, 2011.... no small bowel abnormalities.... most likely source of bleeding was the gastric erosions..... meds adjusted.... patient to see Dr.Patterson soon    (05/07/2010 13:05) Added new observation of REFERRING MD: Verl Blalock (05/07/2010 13:05) Added new  observation of PRIMARY MD: [name] DO (05/07/2010 13:05)       Past History:  Past Medical History: Diabetes Mellitus Type II - uncontrolled. CAD.....Marland Kitchenstatus post coronary artery bypass graft in 2000, status post cardiac catheterization in 2006,     2009..continued chest pain and shortness of breath despite oral medication adjustments including ranexa.     Catheter November 2009/...DES Stent..11/26/2008 to distal RCA leading to acute marginal.  /  cath..01/26/2010..continued patentcy of grafts and stent to RCA.Marland KitchenMarland KitchenModest progression of distal disease...medical Rx  /  [facility]  Apr 23, 2010... no MI... home.. Diovan stopped and into her dose reduced for relative low blood pressure CABG..2000 Obstructive sleep apnea.  I do not know if it is possible if this is playing any role in his overall general fatigue. Depression.  He is on sertraline for this.   Hyperlipidemia and he is tolerating simvastatin.   Good left ventricular function.  Palpitations.  He had sinus rhythm when he wore his  event recorder.  Renal stones. Rrestless legs. ..possible RBBB Depression / Bipolar  Median sternotomy suture... one is broken... noted on chest x-ray November, 2010... no clinical significance Dizziness Edema Hypertension SOB....cardiopulmonary exercise test... March 12, 2010... mild functional limitation.... no clear pulmonary or cardiac limitation.... possible deconditioning and mild chronotropic incompetence...( peak heart rate 130) Spondylosis.Marland KitchenMarland KitchenC5-6,C6-7  MRI...10/15/2009 MRI  Head....chronic microvascular ischemia....10/2009 Anemia..  hemoglobin 7.4.Marland KitchenMarland KitchenMarland KitchenMarland Kitchen iron deficiency... January, 2011.... endoscopy normal....2 unit transfusion in hospital  /  capsule endoscopy..February, 2011.... no small bowel abnormalities.... most likely source of bleeding was the gastric erosions..... meds adjusted.... patient to see Dr.Patterson soon

## 2011-01-07 NOTE — Progress Notes (Signed)
Summary: Lab work?  Phone Note Call from Patient Call back at Home Phone [phone]   Caller: Spouse Summary of Call: Pt wants to know if he needs to come in for lab work before 7.22.11 appt Initial call taken by: Titus Dubin,  June 22, 2010 11:21 AM  Follow-up for Phone Call        BMP prior to visit, ICD-9:  401.9 Hepatic Panel prior to visit, ICD-9:  272.4 Lipid Panel prior to visit, ICD-9:  272.4 TSH prior to visit, ICD-9: 272.4 HbgA1C prior to visit, ICD-9: 250.00 Urine Microalbumin prior to visit, ICD-9: 250.00 CBCD:  285.9  Follow-up by: D. Drema Pry DO,  June 22, 2010 2:09 PM  Additional Follow-up for Phone Call Additional follow up Details #1::        spoke with patients wife [name] [name], she has been advise of the blood work needed per Dr Shawna Orleans instructions. She states patient will come in on Wednesday for blood work Additional Follow-up by: Jiles Garter CMA,  June 22, 2010 3:06 PM

## 2011-01-07 NOTE — Miscellaneous (Signed)
Summary: Eye exam  Clinical Lists Changes  Observations: Added new observation of DMEYEEXAMNXT: 06/2011 (06/25/2010 13:47) Added new observation of DMEYEEXMRES: normal (06/16/2010 13:48) Added new observation of EYE EXAM BY: [facility], Francia Greaves, OD (06/16/2010 13:48) Added new observation of DIAB EYE EX: normal (06/16/2010 13:48)        Diabetes Management Exam:    Eye Exam:       Eye Exam done elsewhere          Date: 06/16/2010          Results: normal          Done by: [facility], Francia Greaves, OD

## 2011-01-07 NOTE — Miscellaneous (Signed)
Summary: Capsule Endoscopy Consent & Schedule forms/[facility]  Capsule Endoscopy Consent & Schedule forms/[facility]   Imported By: Phillis Knack 01/27/2010 07:02:45  _____________________________________________________________________  External Attachment:    Type:   Image     Comment:   External Document

## 2011-01-07 NOTE — Letter (Signed)
Summary: Energy manager, [place]  [id number] [place]   [place], [place] [address]   Phone: [phone]  Fax: [phone]    January 01, 2010  [place]  [place], [place] [address]  [id number] Barabas  Juror: # [id number]  Dear Lessie Dings:   Othella Boyer of [place], [place], [place] has just informed me that he has been chosen to serve on the jury beginning the 3rd day of February, 2011.  Mr Hardin is a patient under my care with the diagnosis of severe coronary artery disease. I do not feel that he should serve on the jury. I would like to request that he be excused from jury duty permanently.  Your consideration of this matter is greatly appreciated.  Respectfully,   Dola Argyle, MD

## 2011-01-07 NOTE — Assessment & Plan Note (Signed)
Summary: per check out/sf      Allergies Added:   Visit Type:  Follow-up Referring Provider:  Verl Blalock Primary Provider:  [name] DO  CC:  CAD.  History of Present Illness: The patient is seen in followup of coronary disease.  The patient has significant stresses at home in addition to his home health care.  His wife recently had a small MI and she has had some ongoing difficulties.  He has a daughter with seizures unfortunately this is stabilizing.  Since seen him last he had a cardiopulmonary exercise test.  This was done March 12, 2010.  He has only a mild motion limitation.  There was no clear-cut cardiac limitation or pulmonary limitation.  It was felt that some of his symptoms might be due to deconditioning and mild atrophic incompetence.  His heart rate peak was 130.  He is feeling relatively well.  He has returned to [employer].  This is a lot of of his and it makes him feel good.  I am in favor of this activity.  Current Medications (verified): 1)  Glimepiride 2 Mg Tabs (Glimepiride) .... One By Mouth Once Daily 2)  Lantus Solostar 100 Unit/ml Soln (Insulin Glargine) .... Inject 28 - 35 Units Daily 3)  Metformin Hcl 1000 Mg  Tabs (Metformin Hcl) .... Take 1 Tablet By Mouth Two Times A Day 4)  Simvastatin 40 Mg  Tabs (Simvastatin) .... Take 1 Tablet By Mouth At Bedtime 5)  Gabapentin 300 Mg  Caps (Gabapentin) .Marland Kitchen.. 1 By Mouth Two Times A Day 6)  Diovan 40 Mg  Tabs (Valsartan) .... Take 1 Tablet By Mouth Once A Day 7)  Sertraline Hcl 100 Mg  Tabs (Sertraline Hcl) .... Take 1 Tablet By Mouth Two Times A Day 8)  Imdur 60 Mg  Tb24 (Isosorbide Mononitrate) .Marland Kitchen.. 1 Tab Daily 9)  Fish Oil 1000 Mg Caps (Omega-3 Fatty Acids) .... Take Two Gel Caps Two Times A Day 10)  Nitroglycerin 0.4 Mg Subl (Nitroglycerin) .... Disolve Under Tounge As Needed 11)  Voltaren 1 % Gel (Diclofenac Sodium) .... Apply 2 Grams Qid 12)  Pramipexole Dihydrochloride 0.25 Mg Tabs (Pramipexole  Dihydrochloride) .... One By Mouth Qhs 13)  Vicodin 5-500 Mg Tabs (Hydrocodone-Acetaminophen) .... Take 1 Tablet By Mouth Two Times A Day As Needed 14)  Aspirin 325 Mg  Tabs (Aspirin) .Marland Kitchen.. 1 By Mouth Daily 15)  Novolog Penfill 100 Unit/ml Soln (Insulin Aspart) .... Inject 1-7 Units Subcutaneously Sliding Scale As Needed 16)  Triamcinolone Acetonide 0.1 % Oint (Triamcinolone Acetonide) .... Apply Two Times A Day To Dry Hands 17)  Ferrous Sulfate 325 (65 Fe) Mg  Tabs (Ferrous Sulfate) .... Two Times A Day 18)  Aciphex 20 Mg  Tbec (Rabeprazole Sodium) .... Take 1 Tablet By Mouth Two Times A Day 19)  Novofine 30g X 8 Mm Misc (Insulin Pen Needle) .... Use For Insulin Injection Three Times A Day 20)  Carafate 1 Gm/65ml  Susp (Sucralfate) .... One Tbsp Qid 21)  Plavix 75 Mg Tabs (Clopidogrel Bisulfate) .... Take 1 Tablet By Mouth Once A Day 22)  Bystolic 10 Mg Tabs (Nebivolol Hcl) .... Take 1  Tab Daily As Directed  Allergies (verified): 1)  ! Morphine  Past History:  Past Medical History: Last updated: 04/09/2010 Diabetes Mellitus Type II - uncontrolled. CAD.....Marland Kitchenstatus post coronary artery bypass graft in 2000, status post cardiac catheterization in 2006,     2009..continued chest pain and shortness of breath despite oral medication adjustments  including ranexa.     Catheter November 2009/...DES Stent..11/26/2008 to distal RCA leading to acute marginal.  /  cath..01/26/2010..continued patentcy of grafts and stent to RCA.Marland KitchenMarland KitchenModest progression of distal disease...medical Rx  CABG..2000 Obstructive sleep apnea.  I do not know if it is possible if this is playing any role in his overall general fatigue. Depression.  He is on sertraline for this.   Hyperlipidemia and he is tolerating simvastatin.   Good left ventricular function.  Palpitations.  He had sinus rhythm when he wore his  event recorder.  Renal stones. Rrestless legs. ..possible RBBB Depression / Bipolar  Median sternotomy suture... one  is broken... noted on chest x-ray November, 2010... no clinical significance Dizziness Edema Hypertension SOB....cardiopulmonary exercise test... March 12, 2010... mild functional limitation.... no clear pulmonary or cardiac limitation.... possible deconditioning and mild chronotropic incompetence...( peak heart rate 130) Spondylosis.Marland KitchenMarland KitchenC5-6,C6-7  MRI...10/15/2009 MRI  Head....chronic microvascular ischemia....10/2009 Anemia..  hemoglobin 7.4.Marland KitchenMarland KitchenMarland KitchenMarland Kitchen iron deficiency... January, 2011.... endoscopy normal....2 unit transfusion in hospital  /  capsule endoscopy..February, 2011.... no small bowel abnormalities.... most likely source of bleeding was the gastric erosions..... meds adjusted.... patient to see Dr.Patterson soon    Review of Systems       Patient denies fever, chills, headache, sweats, rash, change in vision, change in hearing,cough, nausea vomiting, urinary symptoms.  All of the systems are reviewed and are negative.  Vital Signs:  Patient profile:   65 year old male Height:      69 inches Weight:      186 pounds BMI:     27.57 Pulse rate:   70 / minute BP sitting:   128 / 64  (left arm) Cuff size:   regular  Vitals Entered By: Mignon Pine, RMA (Apr 10, 2010 10:45 AM)  Physical Exam  General:  he looks good today. Eyes:  no xanthelasma. Neck:  no jugular venous distention. Lungs:  lungs are clear.  Respiratory effort is nonlabored. Heart:  cardiac exam reveals S1-S2.  No clicks or significant murmurs. Abdomen:  abdomen is soft. Extremities:  no peripheral edema. Psych:  patient is oriented to person time and place her affect is normal.   Impression & Recommendations:  Problem # 1:  * STRESS FROM HIS DAUGHTERS ILLNESS Is doing relatively well relative to his daughter's illness and his wife's illness.  Problem # 2:  ANEMIA (R2654735.9) Anemia stabilized.  No labs are needed today.  Problem # 3:  CORONARY ATHEROSCLEROSIS NATIVE CORONARY ARTERY (ICD-414.01)  His  updated medication list for this problem includes:    Imdur 60 Mg Tb24 (Isosorbide mononitrate) .Marland Kitchen... 1 tab daily    Nitroglycerin 0.4 Mg Subl (Nitroglycerin) .Marland Kitchen... Disolve under tounge as needed    Aspirin 325 Mg Tabs (Aspirin) .Marland Kitchen... 1 by mouth daily    Plavix 75 Mg Tabs (Clopidogrel bisulfate) .Marland Kitchen... Take 1 tablet by mouth once a day    Bystolic 10 Mg Tabs (Nebivolol hcl) .Marland Kitchen... Take 1  tab daily as directed Coronary disease is stable.  No further workup.K. benign that he might have mild chronotropic incompetence.  Problem # 4:  SHORTNESS OF BREATH (ICD-786.05)  His updated medication list for this problem includes:    Diovan 40 Mg Tabs (Valsartan) .Marland Kitchen... Take 1 tablet by mouth once a day    Aspirin 325 Mg Tabs (Aspirin) .Marland Kitchen... 1 by mouth daily    Bystolic 10 Mg Tabs (Nebivolol hcl) .Marland Kitchen... Take 1  tab daily as directed Shortness of breath is stable.  Cardiopulmonary exercise  test revealed no cardiac or pulmonary limitation.  No further workup at this time.  Problem # 5:  DIZZINESS (ICD-780.4) Dizziness is stable today.  Problem # 6:  PALPITATIONS (ICD-785.1)  His updated medication list for this problem includes:    Imdur 60 Mg Tb24 (Isosorbide mononitrate) .Marland Kitchen... 1 tab daily    Nitroglycerin 0.4 Mg Subl (Nitroglycerin) .Marland Kitchen... Disolve under tounge as needed    Aspirin 325 Mg Tabs (Aspirin) .Marland Kitchen... 1 by mouth daily    Plavix 75 Mg Tabs (Clopidogrel bisulfate) .Marland Kitchen... Take 1 tablet by mouth once a day    Bystolic 10 Mg Tabs (Nebivolol hcl) .Marland Kitchen... Take 1  tab daily as directed Is not having any significant palpitations.

## 2011-01-07 NOTE — Progress Notes (Signed)
   EMSI request recieved sent to [facility]. Joelene Millin [place]  July 09, 2010 8:34 AM    Appended Document:  Request for records received from [facility]. Request forwarded to [facility].   Appended Document:  Request for records received from  [facility]. Request forwarded to [facility].

## 2011-01-07 NOTE — Progress Notes (Signed)
Summary: pt still has no appt with dr Deatra Ina yet   Phone Note Call from Patient Call back at [phone]   Caller: Patient Reason for Call: Talk to Nurse, Talk to Doctor Summary of Call: pt was suppose to have an appt with Dr. Deatra Ina by today and if he didn't have one Dr. [name] told him to call Initial call taken by: Shelda Pal,  December 30, 2009 4:29 PM  Follow-up for Phone Call        spoke w/pt he is suppose to be set up for capsule endo and has not heard from gi, will give them a call in am  Kevan Rosebush, RN  December 30, 2009 6:00 PM   spoke w/Dr [name], he wants me to call GI to push them to sch pt soon, left message for Dr Kelby Fam nurse to call back Kevan Rosebush, RN  December 31, 2009 8:57 AM   Additional Follow-up for Phone Call Additional follow up Details #1::        pt is sch, he has appt w/jk tom and will keep Kevan Rosebush, RN  December 31, 2009 3:48 PM

## 2011-01-07 NOTE — Progress Notes (Signed)
  Phone Note Other Incoming   Request: Send information Summary of Call: Request for records received from [facility]. Request forwarded to [facility].

## 2011-01-07 NOTE — Progress Notes (Signed)
Summary: Needs A Capsule Endo Scheduled   Phone Note Other Incoming   Caller: Heather Dr. [name] [phone] Summary of Call: Pt was released from the hospital and is needing a Capsule Endo scheduled Initial call taken by: Webb Laws,  December 31, 2009 8:57 AM  Follow-up for Phone Call        See phone note from 12/30/09.   Follow-up by: Alberteen Spindle RN,  December 31, 2009 9:10 AM

## 2011-01-08 NOTE — Letter (Signed)
Summary: [facility]  [facility]   Imported By: Edmonia James 07/03/2010 14:13:28  _____________________________________________________________________  External Attachment:    Type:   Image     Comment:   External Document

## 2011-01-13 NOTE — Assessment & Plan Note (Signed)
Summary: 2 week eph/mt      Allergies Added:   Visit Type:  post hospital visit Primary Provider:  [name] DO  CC:  CAD.  History of Present Illness: The patient has known significant coronary disease.  Recently he had the sudden onset of feeling quite poorly.  He was seen at the emergency room.  It turns out that it was probably GI.  However he was monitored overnight and there was no evidence of cardiac ischemia.  Since then he is feeling better.  He has noted that at nighttime he sometimes feels his heart "quivering." He has been assessed with monitors in the past and we will not pursue this workup  Current Medications (verified): 1)  Lantus Solostar 100 Unit/ml Soln (Insulin Glargine) .... Inject 20 - 25 Units Daily 2)  Metformin Hcl 1000 Mg  Tabs (Metformin Hcl) .... Take 1/2 Tablet By Mouth Two Times A Day 3)  Simvastatin 40 Mg  Tabs (Simvastatin) .... Take 1 Tablet By Mouth At Bedtime 4)  Gabapentin 300 Mg  Caps (Gabapentin) .Marland Kitchen.. 1 By Mouth By Mouth Qpm 5)  Sertraline Hcl 100 Mg  Tabs (Sertraline Hcl) .... One By Mouth Two Times A Day 6)  Imdur 60 Mg  Tb24 (Isosorbide Mononitrate) .Marland Kitchen.. 1 Tablet By Mouth Once Daily 7)  Fish Oil 1000 Mg Caps (Omega-3 Fatty Acids) .... Take Two Gel Caps Two Times A Day 8)  Nitrostat 0.4 Mg Subl (Nitroglycerin) .Marland Kitchen.. 1 Tablet Under Tongue At Onset of Chest Pain; You May Repeat Every 5 Minutes For Up To 3 Doses. 9)  Voltaren 1 % Gel (Diclofenac Sodium) .... Apply 2 Grams Qid As Needed 10)  Pramipexole Dihydrochloride 0.25 Mg Tabs (Pramipexole Dihydrochloride) .... One By Mouth Qhs 11)  Aspirin 81 Mg Tbec (Aspirin) .... Take One Tablet By Mouth Daily 12)  Novolog Flexpen 100 Unit/ml Soln (Insulin Aspart) .... Use 5-10 Units As Directed Three Times A Day 13)  Triamcinolone Acetonide 0.1 % Oint (Triamcinolone Acetonide) .... Apply Two Times A Day To Dry Hands 14)  Ferrous Sulfate 325 (65 Fe) Mg  Tabs (Ferrous Sulfate) .... Two Times A Day 15)   Pantoprazole Sodium 40 Mg Tbec (Pantoprazole Sodium) .Marland Kitchen.. 1 By Mouth Once Daily 16)  Novofine 30g X 8 Mm Misc (Insulin Pen Needle) .... Use For Insulin Injection Three Times A Day 17)  Carafate 1 Gm/86ml  Susp (Sucralfate) .... One Tbsp Qid 18)  Plavix 75 Mg Tabs (Clopidogrel Bisulfate) .... Take 1 Tablet By Mouth Once A Day 19)  Bystolic 10 Mg Tabs (Nebivolol Hcl) .... Take 1 Tablet By Mouth Once A Day 20)  Levothyroxine Sodium 75 Mcg Tabs (Levothyroxine Sodium) .... One By Mouth Once Daily 21)  Freestyle Lite Test  Strp (Glucose Blood) .... Use To Test Blood Test Blood Sugar Three Times A Day 22)  Zofran 8 Mg Tabs (Ondansetron Hcl) .... Take 1 Tablet By Mouth Two Times A Day 23)  Tramadol Hcl 50 Mg Tabs (Tramadol Hcl) .... One By Mouth Two Times A Day As Needed  Allergies (verified): 1)  ! Morphine  Past History:  Past Medical History: Diabetes Mellitus Type II - uncontrolled. CAD.....Marland Kitchenstatus post coronary artery bypass graft in 2000, status post cardiac catheterization in 2006,     2009..continued chest pain and shortness of breath despite oral medication adjustments including ranexa.     Catheter November 2009/...DES Stent..11/26/2008 to distal RCA leading to acute marginal.  /  cath..01/26/2010..continued patentcy of grafts and stent to  RCA.Marland KitchenMarland KitchenModest progression of distal disease...medical Rx  /  [facility]  Apr 23, 2010... no MI... home.. Diovan stopped and into her dose reduced for relative low blood pressure CABG..2000      Obstructive sleep apnea.  I do not know if it is possible if this is playing any role in his overall general fatigue. Depression.  He is on sertraline for this.   Hyperlipidemia and he is tolerating simvastatin.   Good left ventricular function.   Palpitations.  He had sinus rhythm when he wore his  event recorder..... Renal stones. Rrestless legs. ..on dopamine agonist. RBBB Depression / Bipolar  Median sternotomy suture... one is broken... noted on chest x-ray  November, 2010... no clinical significance Dizziness Edema Hypertension SOB....cardiopulmonary exercise test... March 12, 2010... mild functional limitation.... no clear pulmonary or cardiac limitation.... possible deconditioning and mild chronotropic incompetence...( peak heart rate 130) Spondylosis.Marland KitchenMarland KitchenC5-6,C6-7  MRI...10/15/2009 MRI  Head....chronic microvascular ischemia....10/2009 Anemia..  hemoglobin 7.4.Marland KitchenMarland KitchenMarland KitchenMarland Kitchen iron deficiency... January, 2011.... endoscopy normal....2 unit transfusion in hospital  /  capsule endoscopy..February, 2011.... no small bowel abnormalities.... most likely source of bleeding was the gastric erosions..... meds adjusted.... patient to see Dr.Patterson soon Recommendation for disability   ... May 21, 2010 Falling spells    these have occurred in the past and are again recurring 2011     Review of Systems       Patient denies fever, chills, headache, sweats, rash, change in vision, change in hearing, chest pain, cough, nausea vomiting, urinary symptoms.  All other systems are reviewed and are negative.  Vital Signs:  Patient profile:   65 year old male Height:      69 inches Weight:      183 pounds BMI:     27.12 Pulse rate:   70 / minute BP sitting:   114 / 52  (left arm) Cuff size:   regular  Vitals Entered By: Mignon Pine, RMA (January 05, 2011 12:00 PM)  Physical Exam  General:  he stable today. Eyes:  no xanthelasma. Neck:  no jugular venous distention. Lungs:  lungs are clear respiratory effort is nonlabored. Heart:  cardiac exam reveals S1 and S2.  Soft systolic murmur. Abdomen:  abdomen is soft. Extremities:  no peripheral edema. Psych:  patient is oriented to person time and place affect is normal.  He is here with his wife today.   Impression & Recommendations:  Problem # 1:  * FALLING SPELLS He has not had any recent falling spells.  Problem # 2:  CORONARY ATHEROSCLEROSIS NATIVE CORONARY ARTERY (ICD-414.01) Coronary disease is  stable.  No further workup today.  Problem # 3:  PALPITATIONS (ICD-785.1) He may be having mild palpitations.  I doubt significant arrhythmias.  No further workup.  Patient Instructions: 1)  Your physician wants you to follow-up in:  3 months.  You will receive a reminder letter in the mail two months in advance. If you don't receive a letter, please call our office to schedule the follow-up appointment.

## 2011-02-01 ENCOUNTER — Telehealth: Payer: Self-pay | Admitting: Internal Medicine

## 2011-02-02 ENCOUNTER — Telehealth: Payer: Self-pay | Admitting: Internal Medicine

## 2011-02-11 NOTE — Progress Notes (Signed)
Summary: RX FOR SINUS INFECTION   Phone Note Call from Patient Call back at [phone]   Caller: PATIENT WIFE Call For: YOO  Summary of Call: HE IS AT [place] INFECTION.  HE IS TAKING AMOXICILLAN FOR SOME DENTAL WORK HE HAD DONE.  WHAT WOULD DR YOO SUGGEST HE TAKE FOR THE CONGESTION AND HEADACHE WITH THE SINUS INFECTION.  IS THERE SOMETHING DR YOO COULD CALL IN TO [facility] ON [address] FOR THE PATIENT DAUGHTER TO PICK UP AND TAKE [place]  Initial call taken by: Shanon Payor,  February 02, 2011 10:41 AM  Follow-up for Phone Call        try taking mucinex, nasal saline spray, and tylenol all avail over the counter Follow-up by: D. Drema Pry DO,  February 02, 2011 1:22 PM  Additional Follow-up for Phone Call Additional follow up Details #1::        Pt advised. Gilmore Laroche Fergerson CMA ([place])  February 02, 2011 1:48 PM

## 2011-02-11 NOTE — Progress Notes (Signed)
Summary: alternative for glucometer strips  Phone Note Outgoing Call Call back at [facility] Phone [phone]   Summary of Call: please give pt new one touch meter and send rx for new strips and lancets Initial call taken by: D. Drema Pry DO,  February 02, 2011 1:08 PM [facility]  Chanda Busing [id number]* Call For: Dr Shawna Orleans  Summary of Call: Received fax from pharmacy that [facility] Lite Test strips are non-preferred with pt's ins.  Preferred alternatives are Accu-chek aviva, accu-chek active, accu-chek compact, One Touch test strips and one Touch ultra test strips. Pharmacy is requesting script for one of these, please advise. Gilmore Laroche Fergerson CMA [place])  February 01, 2011 3:28 PM   Follow-up for Phone Call        Pt advised. Will leave One Touch ultraMini meter at front desk for pt's daughter to pick up. Strip and lancet Rxs sent to pharmacy. Gilmore Laroche Fergerson CMA ([place])  February 02, 2011 1:53 PM     New/Updated Medications: ONETOUCH ULTRA BLUE  STRP (GLUCOSE BLOOD) Use to test blood sugar three times a day. ONETOUCH DELICA LANCETS  MISC (LANCETS) Use to test blood sugar three times a day. Prescriptions: ONETOUCH DELICA LANCETS  MISC (LANCETS) Use to test blood sugar three times a day.  #100 x 3   Entered by:   Kelle Darting CMA ([place])   Authorized by:   D. Drema Pry DO   Signed by:   Kelle Darting CMA ([place]) on 02/02/2011   Method used:   Electronically to        [facility] (retail)       [place], [place]  [address]       Ph: [phone] or [phone]       Fax: [phone]   RxID:   [id number] ONETOUCH ULTRA BLUE  STRP (GLUCOSE BLOOD) Use to test blood sugar three times a day.  #100 x 3   Entered by:   Kelle Darting CMA ([place])   Authorized by:   D. Drema Pry DO   Signed by:   Kelle Darting CMA ([place]) on 02/02/2011   Method used:   Electronically to        [facility] [phone]* (retail)       [address]       [place], [place]   [address]       Ph: [phone] or [phone]       Fax: [phone]   RxID:   [id number]

## 2011-02-17 ENCOUNTER — Telehealth (INDEPENDENT_AMBULATORY_CARE_PROVIDER_SITE_OTHER): Payer: Self-pay | Admitting: *Deleted

## 2011-02-19 ENCOUNTER — Telehealth: Payer: Self-pay | Admitting: Internal Medicine

## 2011-02-22 LAB — GLUCOSE, CAPILLARY
Glucose-Capillary: 138 mg/dL — ABNORMAL HIGH (ref 70–99)
Glucose-Capillary: 145 mg/dL — ABNORMAL HIGH (ref 70–99)
Glucose-Capillary: 173 mg/dL — ABNORMAL HIGH (ref 70–99)
Glucose-Capillary: 243 mg/dL — ABNORMAL HIGH (ref 70–99)
Glucose-Capillary: 297 mg/dL — ABNORMAL HIGH (ref 70–99)
Glucose-Capillary: 130 mg/dL — ABNORMAL HIGH (ref 70–99)

## 2011-02-22 LAB — CBC
HCT: 30.9 % — ABNORMAL LOW (ref 39.0–52.0)
HCT: 34.1 % — ABNORMAL LOW (ref 39.0–52.0)
HCT: 36.3 % — ABNORMAL LOW (ref 39.0–52.0)
Hemoglobin: 7.6 g/dL — ABNORMAL LOW (ref 13.0–17.0)
Hemoglobin: 9.6 g/dL — ABNORMAL LOW (ref 13.0–17.0)
Hemoglobin: 12.6 g/dL — ABNORMAL LOW (ref 13.0–17.0)
MCHC: 31.7 g/dL (ref 30.0–36.0)
MCV: 73.6 fL — ABNORMAL LOW (ref 78.0–100.0)
MCV: 89.4 fL (ref 78.0–100.0)
Platelets: 182 10*3/uL (ref 150–400)
Platelets: 154 10*3/uL (ref 150–400)
RBC: 3.4 MIL/uL — ABNORMAL LOW (ref 4.22–5.81)
RBC: 3.91 MIL/uL — ABNORMAL LOW (ref 4.22–5.81)
RBC: 4.04 MIL/uL — ABNORMAL LOW (ref 4.22–5.81)
RDW: 20.7 % — ABNORMAL HIGH (ref 11.5–15.5)
RDW: 15 % (ref 11.5–15.5)
RDW: 15.3 % (ref 11.5–15.5)
WBC: 5 10*3/uL (ref 4.0–10.5)
WBC: 6.8 10*3/uL (ref 4.0–10.5)

## 2011-02-22 LAB — LIPID PANEL
Cholesterol: 93 mg/dL (ref 0–200)
HDL: 31 mg/dL — ABNORMAL LOW (ref >39)
LDL Cholesterol: 45 mg/dL (ref 0–99)
Total CHOL/HDL Ratio: 3 RATIO
Triglycerides: 83 mg/dL (ref <150)

## 2011-02-22 LAB — DIFFERENTIAL
Basophils Absolute: 0 10*3/uL (ref 0.0–0.1)
Basophils Relative: 0 % (ref 0–1)
Eosinophils Absolute: 0.2 10*3/uL (ref 0.0–0.7)
Eosinophils Relative: 3 % (ref 0–5)
Lymphocytes Relative: 28 % (ref 12–46)
Lymphs Abs: 1.9 10*3/uL (ref 0.7–4.0)
Monocytes Relative: 9 % (ref 3–12)
Neutro Abs: 4 10*3/uL (ref 1.7–7.7)
Neutrophils Relative %: 59 % (ref 43–77)

## 2011-02-22 LAB — POCT I-STAT, CHEM 8
Calcium, Ion: 1.16 mmol/L (ref 1.12–1.32)
Glucose, Bld: 141 mg/dL — ABNORMAL HIGH (ref 70–99)
HCT: 35 % — ABNORMAL LOW (ref 39.0–52.0)
Hemoglobin: 11.9 g/dL — ABNORMAL LOW (ref 13.0–17.0)

## 2011-02-22 LAB — CROSSMATCH: ABO/RH(D): O POS

## 2011-02-22 LAB — VITAMIN B12: Vitamin B-12: 228 pg/mL (ref 211–911)

## 2011-02-22 LAB — HEPATIC FUNCTION PANEL
ALT: 19 U/L (ref 0–53)
Albumin: 3.9 g/dL (ref 3.5–5.2)
Alkaline Phosphatase: 49 U/L (ref 39–117)
Total Protein: 6.5 g/dL (ref 6.0–8.3)

## 2011-02-22 LAB — COMPREHENSIVE METABOLIC PANEL
Alkaline Phosphatase: 59 U/L (ref 39–117)
BUN: 8 mg/dL (ref 6–23)
CO2: 27 mEq/L (ref 19–32)
Chloride: 106 mEq/L (ref 96–112)
GFR calc non Af Amer: >60 mL/min (ref >60)
Glucose, Bld: 130 mg/dL — ABNORMAL HIGH (ref 70–99)
Potassium: 3.9 mEq/L (ref 3.5–5.1)
Total Bilirubin: 0.6 mg/dL (ref 0.3–1.2)
Total Protein: 6.3 g/dL (ref 6.0–8.3)

## 2011-02-22 LAB — CARDIAC PANEL(CRET KIN+CKTOT+MB+TROPI)
CK, MB: 0.9 ng/mL (ref 0.3–4.0)
CK, MB: 1.1 ng/mL (ref 0.3–4.0)
Total CK: 65 U/L (ref 7–232)
Troponin I: <0.01 ng/mL (ref 0.00–0.06)

## 2011-02-22 LAB — BASIC METABOLIC PANEL
BUN: 13 mg/dL (ref 6–23)
Calcium: 8.8 mg/dL (ref 8.4–10.5)
Creatinine, Ser: 0.79 mg/dL (ref 0.4–1.5)
GFR calc Af Amer: >60 mL/min (ref >60)
GFR calc non Af Amer: >60 mL/min (ref >60)
GFR calc non Af Amer: >60 mL/min (ref >60)
Glucose, Bld: 177 mg/dL — ABNORMAL HIGH (ref 70–99)
Potassium: 4.1 mEq/L (ref 3.5–5.1)
Sodium: 137 mEq/L (ref 135–145)

## 2011-02-22 LAB — FOLATE RBC: RBC Folate: 735 ng/mL — ABNORMAL HIGH (ref 180–600)

## 2011-02-22 LAB — IRON AND TIBC
Iron: <10 ug/dL — ABNORMAL LOW (ref 42–135)
UIBC: 470 ug/dL

## 2011-02-22 LAB — CK TOTAL AND CKMB (NOT AT ARMC)
CK, MB: 1.4 ng/mL (ref 0.3–4.0)
Total CK: 79 U/L (ref 7–232)

## 2011-02-22 LAB — FERRITIN: Ferritin: 3 ng/mL — ABNORMAL LOW (ref 22–322)

## 2011-02-22 LAB — POCT CARDIAC MARKERS
CKMB, poc: <1 ng/mL — ABNORMAL LOW (ref 1.0–8.0)
Troponin i, poc: <0.05 ng/mL (ref 0.00–0.09)

## 2011-02-22 LAB — PROTIME-INR: Prothrombin Time: 12.8 seconds (ref 11.6–15.2)

## 2011-02-22 LAB — TSH: TSH: 2.451 u[IU]/mL (ref 0.350–4.500)

## 2011-02-22 LAB — APTT: aPTT: 29 seconds (ref 24–37)

## 2011-02-23 NOTE — Progress Notes (Signed)
   DDS Request received sent to [facility]  February 17, 2011 8:48 AM

## 2011-02-23 NOTE — Progress Notes (Signed)
Summary: refill-novofine  Phone Note Refill Request Message from:  Fax from Pharmacy on February 19, 2011 8:41 AM  Refills Requested: Medication #1:  novofine 30g x 1/3" needles use for insulin injection three times a day   Brand Name Necessary? No   Supply Requested: 1 month   Last [facility]: [facility] [id number] [place] [address] fax [phone]   Method Requested: Electronic Next Appointment Scheduled: 3.27.12 yoo Initial call taken by: Trenton Gammon,  February 19, 2011 8:42 AM  Follow-up for Phone Call        Rx completed in Dr. Brantley Stage Follow-up by: Jiles Garter CMA,  February 19, 2011 9:10 AM    Prescriptions: NOVOFINE 30G X 8 MM MISC (INSULIN PEN NEEDLE) use for insulin injection three times a day  #100 x 5   Entered by:   Jiles Garter CMA   Authorized by:   D. Drema Pry DO   Signed by:   Jiles Garter CMA on 02/19/2011   Method used:   Electronically to        [facility] [phone]* (retail)       [address]       [place], [place]  [address]       Ph: [phone] or [phone]       Fax: [phone]   RxID:   [id number]

## 2011-03-01 ENCOUNTER — Other Ambulatory Visit: Payer: Self-pay | Admitting: Internal Medicine

## 2011-03-02 ENCOUNTER — Encounter: Payer: Self-pay | Admitting: Internal Medicine

## 2011-03-02 ENCOUNTER — Ambulatory Visit (INDEPENDENT_AMBULATORY_CARE_PROVIDER_SITE_OTHER): Payer: 59 | Admitting: Internal Medicine

## 2011-03-02 DIAGNOSIS — E119 Type 2 diabetes mellitus without complications: Secondary | ICD-10-CM

## 2011-03-02 DIAGNOSIS — E039 Hypothyroidism, unspecified: Secondary | ICD-10-CM

## 2011-03-02 MED ORDER — INSULIN ASPART 100 UNIT/ML ~~LOC~~ SOLN: SUBCUTANEOUS | Status: DC

## 2011-03-02 NOTE — Assessment & Plan Note (Signed)
Lab Results  Component Value Date   TSH 3.390 03/01/2011   Continue same dose.

## 2011-03-02 NOTE — Progress Notes (Signed)
  Subjective:    Patient ID: [name] [name], male    DOB: Oct 21, 1946, 65 y.o.   MRN: [id number]  Diabetes He presents for his follow-up diabetic visit. He has type 2 diabetes mellitus. His disease course has been worsening. There are no hypoglycemic associated symptoms. Pertinent negatives for diabetes include no chest pain and no visual change. There are no hypoglycemic complications. Current diabetic treatment includes insulin injections. He is compliant with treatment most of the time. His weight is stable. He is following a generally unhealthy diet. He participates in exercise intermittently. His home blood glucose trend is increasing steadily. An ACE inhibitor/angiotensin II receptor blocker is being taken.   Pt did well for several months on low carb diet but pt going back to prev eating habits     Review of Systems  Cardiovascular: Negative for chest pain.       Objective:   Physical Exam  Constitutional: He is oriented to person, place, and time. He appears well-developed and well-nourished.  Cardiovascular: Normal rate and regular rhythm.   Pulmonary/Chest: Effort normal and breath sounds normal.  Neurological: He is oriented to person, place, and time.  Psychiatric: He has a normal mood and affect. His behavior is normal.          Assessment & Plan:

## 2011-03-02 NOTE — Assessment & Plan Note (Addendum)
A1c deteriorated.    Dietary compliance poor.  If pt unable avoid sweets, add short acting insulin before snacks  BMET and A1c reviewed

## 2011-03-02 NOTE — Progress Notes (Signed)
Addended by: Drema Pry DR. on: 03/02/2011 10:06 AM   Modules accepted: Orders

## 2011-03-02 NOTE — Progress Notes (Signed)
  Subjective:    Patient ID: [name] [name], male    DOB: [birth year], 65 y.o.   MRN: [id number]  HPI    Review of Systems     Objective:   Physical Exam        Assessment & Plan:

## 2011-03-10 ENCOUNTER — Other Ambulatory Visit: Payer: Self-pay | Admitting: Internal Medicine

## 2011-03-10 DIAGNOSIS — IMO0001 Reserved for inherently not codable concepts without codable children: Secondary | ICD-10-CM

## 2011-03-10 DIAGNOSIS — E785 Hyperlipidemia, unspecified: Secondary | ICD-10-CM

## 2011-03-18 ENCOUNTER — Other Ambulatory Visit: Payer: Self-pay | Admitting: Cardiology

## 2011-03-18 LAB — GLUCOSE, CAPILLARY: Glucose-Capillary: 122 mg/dL — ABNORMAL HIGH (ref 70–99)

## 2011-03-18 NOTE — Telephone Encounter (Signed)
No please have pcp refill

## 2011-03-19 NOTE — Telephone Encounter (Signed)
Med refill sent to pharmacy  

## 2011-04-01 ENCOUNTER — Ambulatory Visit (INDEPENDENT_AMBULATORY_CARE_PROVIDER_SITE_OTHER): Payer: 59 | Admitting: Internal Medicine

## 2011-04-01 ENCOUNTER — Encounter: Payer: Self-pay | Admitting: Internal Medicine

## 2011-04-01 ENCOUNTER — Telehealth: Payer: Self-pay | Admitting: Internal Medicine

## 2011-04-01 DIAGNOSIS — G4700 Insomnia, unspecified: Secondary | ICD-10-CM

## 2011-04-01 DIAGNOSIS — E119 Type 2 diabetes mellitus without complications: Secondary | ICD-10-CM

## 2011-04-01 MED ORDER — INSULIN GLARGINE 100 UNIT/ML ~~LOC~~ SOLN: SUBCUTANEOUS | Status: DC

## 2011-04-01 NOTE — Patient Instructions (Signed)
Use over the counter melatonin as directed Please call our office if your low blood sugar symptoms do not improve or gets worse.

## 2011-04-01 NOTE — Telephone Encounter (Signed)
Rx refill sent to pharmacy. 

## 2011-04-01 NOTE — Telephone Encounter (Signed)
lantus needs refill to [facility] [address]   Patient # [phone]

## 2011-04-01 NOTE — Progress Notes (Signed)
Subjective:    Patient ID: [name] [name], male    DOB: [birth year], 65 y.o.   MRN: [id number]  HPI  65 y/o male with DM II for follow up.  Pt experiencing intermittent hypoglycemia.   Hypoglycemia episodes usually occur in afternoon.  Pt not adjusting insulin dose based upon carb intake.  Pt also c/o chronic insomnia.  Review of Systems  Past Medical History  Diagnosis Date  . Diabetes mellitus type II, uncontrolled   . CAD (coronary artery disease)   . OSA (obstructive sleep apnea)   . Hyperlipidemia   . RBBB (right bundle branch block)   . Depression     Bipolar  . Edema   . Hypertension   . Falling episodes     these have occurred in the past and again recurring 2011  . Spondylosis     C5-6, C6-7 MRI 2010    History   Social History  . Marital Status: Married    Spouse Name: N/A    Number of Children: N/A  . Years of Education: N/A   Occupational History  . Not on file.   Social History Main Topics  . Smoking status: Never Smoker   . Smokeless tobacco: Not on file  . Alcohol Use: No  . Drug Use: No  . Sexually Active:    Other Topics Concern  . Not on file   Social History Narrative  . No narrative on file    Past Surgical History  Procedure Date  . Nasal septum surgery     UP3  . Coronary artery bypass graft     2000    Family History  Problem Relation Age of Onset  . Cancer Brother     pancreatic cancer  . Diabetes Brother     4   . Stroke Brother   . Cancer Brother   . Coronary artery disease Brother   . Hypothyroidism Brother   . Heart attack      Nephew    Allergies  Allergen Reactions  . Morphine     REACTION: hallucinations    Current Outpatient Prescriptions on File Prior to Visit  Medication Sig Dispense Refill  . aspirin 81 MG tablet Take 81 mg by mouth daily.        . clopidogrel (PLAVIX) 75 MG tablet Take 75 mg by mouth daily.        . [facility] IRON 325 (65 FE) MG tablet TAKE 1 TABLET BY MOUTH TWICE A DAY  60 tablet   2  . diclofenac sodium (VOLTAREN) 1 % GEL Apply topically. Apply two times a day as needed       . fish oil-omega-3 fatty acids 1000 MG capsule Take 2 g by mouth 2 (two) times daily.        Marland Kitchen glucose blood (FREESTYLE LITE) test strip 1 each by Other route. Use as instructed to test blood sugar three times daily       . Insulin Pen Needle (NOVOFINE) 30G X 8 MM MISC Inject 1 packet into the skin as needed. Use for insulin injections three times a day       . isosorbide mononitrate (IMDUR) 60 MG 24 hr tablet Take 60 mg by mouth daily.        . metFORMIN (GLUCOPHAGE) 1000 MG tablet Take 1,000 mg by mouth. 1/2 tablet by mouth two times a day       . nebivolol (BYSTOLIC) 10 MG tablet Take 10 mg by mouth daily.        Marland Kitchen  nitroGLYCERIN (NITROSTAT) 0.4 MG SL tablet Place 0.4 mg under the tongue every 5 (five) minutes as needed. 1 tablet under tongue at onset of chest pain; may repeat every 5 minutes for up to 3 doses       . ondansetron (ZOFRAN) 8 MG tablet Take by mouth every 12 (twelve) hours as needed.        . pantoprazole (PROTONIX) 40 MG tablet Take 40 mg by mouth daily.        . pramipexole (MIRAPEX) 0.25 MG tablet Take 0.25 mg by mouth at bedtime.        . simvastatin (ZOCOR) 40 MG tablet Take 40 mg by mouth at bedtime.        . sucralfate (CARAFATE) 1 GM/10ML suspension Take 1 g by mouth 4 (four) times daily.        . traMADol (ULTRAM) 50 MG tablet Take 50 mg by mouth 2 (two) times daily as needed.          BP 124/64  Pulse 62  Temp(Src) 97.7 F (36.5 C) (Oral)  Resp 16  Wt 189 lb (85.73 kg)  SpO2 98%       Objective:   Physical Exam  Constitutional: He appears well-developed and well-nourished.  Cardiovascular: Normal rate, regular rhythm and normal heart sounds.   Pulmonary/Chest: Effort normal and breath sounds normal. He has no wheezes. He has no rales.  Skin: Skin is warm and dry.  Psychiatric: He has a normal mood and affect.          Assessment & Plan:

## 2011-04-02 ENCOUNTER — Telehealth: Payer: Self-pay | Admitting: Internal Medicine

## 2011-04-02 MED ORDER — LEVOTHYROXINE SODIUM 75 MCG PO TABS: 75.0000 ug | ORAL_TABLET | Freq: Every day | ORAL | Status: DC

## 2011-04-02 MED ORDER — INSULIN LISPRO 100 UNIT/ML ~~LOC~~ SOLN: SUBCUTANEOUS | Status: DC

## 2011-04-02 NOTE — Telephone Encounter (Signed)
Humalog should work just as well as novolg. Please call in refill for levothyroxine

## 2011-04-02 NOTE — Telephone Encounter (Signed)
Pt states that he received call from his ins company stating that they will no longer pay for novolog. Pt is very upset over this.pt states that ins co would be faxing over papers regarding novolog. Pt states pharmacy gave him hamalog instead of novolog to take. Pt refuses to take this medicine. Also, pharmacy states that they never got a refill for levothyroxine.

## 2011-04-02 NOTE — Telephone Encounter (Signed)
Ok to send rx for [employer]

## 2011-04-02 NOTE — Telephone Encounter (Signed)
Call returned to patient at  [phone], He stated that his insurance denied the approval on the Novolog refill from his previous, but Humalog is the preferred medication. He is requesting a new Rx for the Humalog sent [facility].

## 2011-04-06 ENCOUNTER — Telehealth: Payer: Self-pay | Admitting: Internal Medicine

## 2011-04-06 MED ORDER — SERTRALINE HCL 100 MG PO TABS: 100.0000 mg | ORAL_TABLET | Freq: Two times a day (BID) | ORAL | Status: DC

## 2011-04-06 NOTE — Telephone Encounter (Signed)
Refill- sertraline hcl 100mg  tablet. Take 1 tablet twice a day. Qty 180. Last fill 2.24.12

## 2011-04-13 ENCOUNTER — Other Ambulatory Visit: Payer: Self-pay | Admitting: *Deleted

## 2011-04-13 NOTE — Telephone Encounter (Signed)
Spoke to Gus at [facility] re: pt request to change to Humalog Kwikpen instead of vial. Refilled Kwikpen 1 bx/5pens 16mL each x 3 refills. Refilled gabapentin 300mg  1capsule every evening #90 x no refills.

## 2011-04-14 ENCOUNTER — Other Ambulatory Visit: Payer: Self-pay | Admitting: Gastroenterology

## 2011-04-20 NOTE — Assessment & Plan Note (Signed)
[facility] OFFICE NOTE   NAME:Tuch, BELINDA OGANDO                    MRN:          [id number]  DATE:08/28/2007                            DOB:          [birth year]    The patient is seen for follow-up.  He has known coronary artery  disease.  He has been stable.  I saw him last in March of 2008.  At that  time he had some chest burning.  We did a nuclear study and there was no  ischemia.  He has some mild persistent burning, but it does not cause  major problems and I believe it is stable.  He has not had any syncope  or presyncope.   ALLERGIES:  MORPHINE.   MEDICATIONS:  1. IMDUR.  2. Advicor.  3. Aspirin.  4. Metformin.  5. Amaryl.  6. Sertraline.  7. Gabapentin.  8. Metoprolol.  9. __________  10.Diovan.   PAST MEDICAL HISTORY:  See the list on the note of February 07, 2007.   REVIEW OF SYSTEMS:  He is stable and his review of systems otherwise is  negative.   PHYSICAL EXAMINATION:  VITAL SIGNS:  Weight 181 pounds, blood pressure  128/70, pulse 66.  GENERAL:  The patient is oriented to person, time, and place.  Affect is  normal.  HEENT:  No xanthelasma.  He has normal extraocular motion.  There are no  carotid bruits.  There is no jugular venous distention.  LUNGS:  Clear.  Respiratory effort is not labored.  HEART:  S1 and S2.  There are no clicks or significant murmurs.  ABDOMEN:  Soft.  There are no masses or bruits.  He has no peripheral  edema.   EKG reveals old right bundle branch block, no significant change.   PROBLEM LIST:  Problems are listed on the note of February 07, 2007, and  reviewed completely.  #5.  Coronary artery disease.  His grafts were patent in March of 2007.  In March of 2008 he had no ischemia by Myoview scan.  #6.  Good left ventricular function.   I believe his overall cardiac status is stable.  There will be no change  in his medicines.     Carlena Bjornstad, MD,  [facility]  Electronically Signed    JDK/MedQ  DD: 08/28/2007  DT: 08/28/2007  Job #: [id number]   cc:   Sandy Salaam. Shawna Orleans, DO

## 2011-04-20 NOTE — Assessment & Plan Note (Signed)
[name]                            CARDIOLOGY OFFICE NOTE   NAME:[name], [name] [name]                    MRN:          [id number]  DATE:04/15/2008                            DOB:          1946-10-16    [name] [name] is here for follow-up.  Unfortunately, he continues to have  exertional shortness of breath and a cramping sensation in his left  chest.  We had decided to try some amlodipine.  Also, he was to wear an  event recorder.  The event recorder reveals sinus rhythm.  There is no  evidence that he is having significant tachy or brady arrhythmias  causing his problems.  Unfortunately he is still limited.  When he walks  across the hospital visiting his brother who had CABG, he has some  shortness of breath and some discomfort in his left upper chest.   PAST MEDICAL HISTORY:   ALLERGIES:  MORPHINE.   MEDICATIONS:  1. Metformin.  2. Imdur 90.  3. Diovan 40.  4. Amaryl 4 b.i.d.  5. Sertraline 100 b.i.d.  6. Aspirin 325.  7. Levemir insulin.  8. Simvastatin 40.  9. Amlodipine 5.  10.Coreg 12.5 b.i.d.   OTHER MEDICAL PROBLEMS:  See the list below.   REVIEW OF SYSTEMS:  He has a lot of stress in that family members have  been quite ill.  He is, of course, disappointed that he is continuing to  be limited by his symptoms.  Otherwise, review of systems is negative.   PHYSICAL EXAMINATION:  VITAL SIGNS:  Blood pressure is 110/58 with a  pulse of 64.  GENERAL APPEARANCE:  The patient is oriented to person, time and place.  Affect is normal.  HEENT:  No xanthelasma.  He has normal extraocular motion.  NECK:  There are no carotid bruits.  There is no jugular venous  distention.  LUNGS:  Clear.  Respiratory effort is not labored.  CARDIAC:  Exam reveals an S1 with an S2.  There are no clicks or  significant murmurs.  ABDOMEN:  Soft.  He has no peripheral edema.   No labs were done today.  He does have the event recorder that he has  been  wearing and he has sinus rhythm.   PROBLEMS:  1. Obstructive sleep apnea.  2. History of depression.  3. Hyperlipidemia.  We have switched him to Zocor and he is tolerating      this.  4. Diabetes.  5. Coronary disease.  Cath in March 2009 revealed that his grafts were      patent but he had significant distal disease and he continues to      have symptoms.  He is quite limited by this symptomatology.  I have      chosen to give him a trial of adding Ranexa to his medications and      then see him back.  After this time, I may have to ask for further      advice concerning how to approach his discomfort.  6. History of good left ventricular function.  7. History of some palpitations.  With the event recorder, he has had      normal sinus.  8. History of renal stones.  9. Possible restless legs.  10.Old right bundle branch block.   We will add Ranexa.  He is definitely disabled from his work at this  time.     Carlena Bjornstad, MD, [facility]  Electronically Signed    JDK/MedQ  DD: 04/15/2008  DT: 04/15/2008  Job #: [id number]   cc:   Sandy Salaam. Shawna Orleans, DO

## 2011-04-20 NOTE — Cardiovascular Report (Signed)
[name] [name], [name] [name]             ACCOUNT NO.:  [id number]   MEDICAL RECORD NO.:  [id number]          PATIENT TYPE:  OIB   LOCATION:  1963                         FACILITY:  [facility]   PHYSICIAN:  Loretha Brasil. Lia Foyer, MD, FACCDATE OF BIRTH:  Aug 03, 1946   DATE OF PROCEDURE:  10/29/2008  DATE OF DISCHARGE:  10/29/2008                            CARDIAC CATHETERIZATION   INDICATIONS:  Mr. Brahmbhatt is a 65 year old well known to [place].  He has  previously undergone revascularization surgery, last cath was done in  March 2009.  The current study was done to assess recurrent ischemic-  type symptoms.  Risks, benefits, and alternatives were discussed and the  patient was agreeable to proceed.   PROCEDURES:  1. Left heart catheterization.  2. Selective coronary arteriography.  3. Selective left ventriculography.  4. Saphenous vein graft angiography.  5. Selective left internal mammary angiography.   DESCRIPTION OF PROCEDURE:  The patient was brought to the  catheterization laboratory, and prepped and draped in the usual fashion.  Through the anterior puncture, the femoral artery was easily entered and  a 4-French sheath was placed.  Views of the left and right coronary  arteries were then obtained in multiple angiographic projections.  All  vein grafts and internal mammary injections were performed.  Central  aortic and left ventricular pressures were measured with pigtail.  Ventriculography was performed in the RAO projection.  The patient  tolerated the procedure well.  There were no major complications.  He  was taken to the holding area in satisfactory clinical condition.   HEMODYNAMIC DATA:  1. Central aortic pressure was 135/70, mean 96.  2. Left ventricular pressure was 135/12 with LVEDP of 16.  3. There was no gradient pullback across the aortic valve.   ANGIOGRAPHIC DATA:  1. Ventriculography was performed in the RAO projection.  Overall      systolic function was preserved.  At  most, there would be minimal      inferior hypokinesis.  2. The left anterior descending artery provides a septal and a      diagonal that is occluded with a stent in place.  3. The internal mammary to the distal LAD is intact.  4. The saphenous vein graft to the diagonal is intact and fills      retrograde into the proximal LAD.  5. The saphenous vein graft to the second diagonal is intact.  6. The circumflex is a large caliber vessel providing a large marginal      system.  There is an AV circumflex distally that has probably 30-      40% segmental plaquing.  7. The right coronary artery provides an acute marginal, which      supplies the distal portion of the inferior wall.  There is      segmental 60-70% narrowing in its midportion.  There does not      appear to be flow into the posterolateral system.  8. The saphenous vein graft to the posterolateral system is widely      patent.   CONCLUSION:  1. Preserved LV function.  2. Continued patency  of the internal mammary to the LAD.  3. Continued patency of the saphenous vein grafts to the diagonals.  4. Continued patency of the circumflex native.  5. Continued patency of the saphenous vein graft to the posterolateral      system.  6. A 50-70% narrowing of the mid right coronary artery that supplies      the acute marginal which supplies the distal portion of the      inferior wall.   Dr. [name] and I plan to review the films.  It may be that he would be a  candidate for stenting of the mid right coronary given his symptoms.  We  will discuss all the options with the patient and his family.      Loretha Brasil. Lia Foyer, MD, [facility]  Electronically Signed     TDS/MEDQ  D:  10/29/2008  T:  10/29/2008  Job:  [id number]   cc:   Carlena Bjornstad, MD, [facility]  CV Laboratory

## 2011-04-20 NOTE — Cardiovascular Report (Signed)
[name] [name], [name] [name]             ACCOUNT NO.:  [id number]   MEDICAL RECORD NO.:  [id number]          PATIENT TYPE:  OIB   LOCATION:  1966                         FACILITY:  [facility]   PHYSICIAN:  Loretha Brasil. Lia Foyer, MD, FACCDATE OF BIRTH:  [birth year]   DATE OF PROCEDURE:  02/23/2008  DATE OF DISCHARGE:                            CARDIAC CATHETERIZATION   INDICATIONS:  Mr. Falkowitz is a very delightful gentleman who has  undergone revascularization surgery.  He has developed some mild  recurrence of chest pain.  He has had previous revascularization  surgery.  The current study is done to assess coronary anatomy.   PROCEDURE:  1. Left heart catheterization  2. Selective coronary arteriography.  3. Selective left ventriculography.  4. Saphenous vein graft angiography.  5. Selective left internal mammary angiography.   DESCRIPTION OF PROCEDURE:  The patient was brought to the  catheterization laboratory and  prepped and draped in the usual fashion.  Through an anterior puncture the femoral artery was easily entered and a  4-French sheath was placed.  Following this, views of the left and right  coronary arteries were obtained in multiple angiographic projections.  Vein graft angiography and internal mammary angiography were also  performed.  Ventriculography was performed in the RAO projection.  He  tolerated the procedure well.  There were no major complications.   HEMODYNAMIC DATA:  1. Central aortic pressure 132/67, mean 92.  2. Left ventricular pressure 120/12.  3. There was no gradient on pullback across the aortic valve.   ANGIOGRAPHIC DATA:  1. The left main is free of critical disease.  2. The left anterior descending artery courses to the apex.  There is      a small first diagonal branch that is insignificant.  There is a      70% stenosis just prior to the takeoff of the second diagonal that      is about 70% segmental diseased.  There is also evidence of  competitive filling at this location.  The LAD then fills beyond      this.  There is a stent in the second diagonal which is occluded.      There appears to be a stent in the distal LAD which is also      occluded and there is a small caliber vessel in this location.  3. The saphenous vein graft to the first diagonal appears to be      intact, and fills nicely in a retrograde fashion into the      midvessel.  4. The saphenous vein graft to the previously stented second diagonal      is also intact.  It is small in caliber, but does fill the distal      vessel which is also small in caliber.  5. The left internal mammary to the distal LAD is intact.  The distal      LAD itself is a small caliber vessel that appears diffusely      irregular throughout down to the distal apex.  6. The circumflex is a fairly large caliber vessel.  The circumflex  provides a small proximal marginal branch that is small in caliber.      It supplies a second marginal branch that is small in caliber.      There is a large third marginal branch that has segmental plaquing      of about 30% in its proximal portion and the fourth marginal branch      is widely patent.  7. The right coronary artery has about a 50% to at most 60% lesion in      the mid vessel which supplies an acute marginal branch.  The distal      right coronary system is not seen.  8. The saphenous vein graft to the distal right coronary artery fills      nicely into the posterolateral segment.  9. Left ventriculography demonstrates ejection fraction in the range      of about 50-55%.  Overall wall motion appears to be reasonably      preserved.   CONCLUSIONS:  1. Continued patency of the native circumflex.  2. Continued patency of the saphenous vein, previously stented second      diagonal, and distal right coronary circulation.  3. Continued patency of the internal mammary to what appears to be      probably a diffusely irregular distal  left anterior descending.  4. A 50-60% narrowing in the mid right coronary supplying an acute      marginal which supplies the distal portion of the inferior wall.   PLAN:  The patient will follow up with Dr. [name], probable medical  therapy.      Loretha Brasil. Lia Foyer, MD, [facility]  Electronically Signed     TDS/MEDQ  D:  02/23/2008  T:  02/23/2008  Job:  [id number]   cc:   Carlena Bjornstad, MD, [facility]  Loretha Brasil. Lia Foyer, MD, [facility]  CV Laboratory

## 2011-04-20 NOTE — Assessment & Plan Note (Signed)
[facility]                            CARDIOLOGY OFFICE NOTE   NAME:[name] [name], [name] [name]                    MRN:          [id number]  DATE:10/10/2008                            DOB:          Aug 21, 1946    [name] [name] is here for followup.  He has severe coronary artery  disease.  I have reviewed his situation extensively over time and  adjusted his medicines.  Most recently on September 03, 2008, I reviewed  the patient's films extensively with Dr. Lia Foyer.  We have decided that  it would be appropriate to re-cath the patient.  If there were no change  from the past, there would be no indication for intervention.  If there  were to be a significant change, then an intervention could be  considered.   Today, I have discussed this with [name] [name].  He is in agreement.   The patient is able to continue working.  He cannot predict when he will  have discomfort.  There are times when he has profuse sweating with a  cramping sensation in his chest.  There other times when he works and  has no difficulties at all.  At this point, it is very difficult to know  if this represents ischemia or not.  It does affect his life  significant.   PAST MEDICAL HISTORY:   ALLERGIES:  MORPHINE.   MEDICATIONS:  See the flow sheet.   REVIEW OF SYSTEMS:  No GI or GU symptoms.  No fevers.  He has no rashes.  His review of systems otherwise is negative.   PHYSICAL EXAMINATION:  VITAL SIGNS:  Blood pressure is 124/64 with a  pulse of 75.  GENERAL:  The patient is oriented to person, time, and place.  Affect is  normal.  HEENT:  No xanthelasma.  He has normal extraocular motion.  NECK:  There are no carotid bruits.  There is no jugular venous  distention.  LUNGS:  Clear.  Respiratory effort is nonlabored.  CARDIAC:  An S1 with an S2.  There are no clicks or significant murmurs.  ABDOMEN:  Soft.  EXTREMITIES:  He has no peripheral edema.   Problems are listed on  the prior notes.  Coronary artery disease is  significant and he is post CABG.  I will range for a followup  catheterization specifically with Dr. Lia Foyer during the week of  Thanksgiving.  This is to be scheduled.     Carlena Bjornstad, MD, [facility]  Electronically Signed   JDK/MedQ  DD: 10/10/2008  DT: 10/11/2008  Job #: [id number]

## 2011-04-20 NOTE — Assessment & Plan Note (Signed)
[place] OFFICE NOTE   NAME:Kho, SUFYAN SCHOOL                    MRN:          [id number]  DATE:02/28/2008                            DOB:          12-Nov-1946    [name] [name] is seen for cardiology followup.  I saw [name] [name] last,  and we decided to proceed with cardiac catheterization.  The study was  done on February 23, 2008 by [name] [name].  [name] [name] called me, and we  had a very careful and lengthy discussion about this patient.  He has  patent vein graft.  He has a patent internal mammary.  He has a distal  LAD disease that is significant.  There is 50% to 60% narrowing of the  mid-right, supplying an acute marginal that supplies the distal portion  of the inferior wall.  It is felt that the best approach for the patient  is medically.  Unfortunately, he has some chest pain symptoms.  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   PAST MEDICAL HISTORY:  ALLERGIES TO MORPHINE.   MEDICATIONS:  1. Imdur 60 (dose to be increased 90).  2. Aspirin.  3. Metformin.  4. Amaryl.  5. Sertraline.  6. __________  7. Diovan 80.  8. Advicor 500.  Note that he had been on metoprolol, and at some point it was stopped.  I do not know exactly when.   OTHER MEDICAL PROBLEMS:  See the list below.   REVIEW OF SYSTEMS:  He feels fatigued in general.  He is not returned to  full activity yet, because we told him to limit his activities.  Otherwise, his review of systems is negative.   PHYSICAL EXAM:  VITAL SIGNS:  Blood pressure is 118/71 with a pulse of  40.  GENERAL:  The patient is oriented to person, time and place.  Affect is  normal.  HEENT:  Reveals no xanthelasma.  He has normal extraocular motion.  There are no carotid bruits.  There is no jugular venous distention.  LUNGS:  Clear.  Respiratory effort is not labored.  CARDIAC:   Exam reveals S1 with an S2.  There are no clicks or  significant murmurs.  ABDOMEN:  Soft.  There are normal bowel sounds.  There is no peripheral  edema.   EKG reveals no change.  He has an old right bundle branch block.   PROBLEMS:  Include:  1. Obstructive sleep apnea with surgery in the past.  2. History of some depression.  3. Hyperlipidemia.  4. Diabetes.  5. Coronary disease.  See the cath note from February 23, 2008.  His      grafts are patent, but he has significant distal disease.  Medical      therapy is recommended.  6. History of good LV function.  7. Question of some palpitations.  8. History of renal stones.  9. Possible restless leg.  10.Old right bundle branch block.   [name] [name] is stable.  However, it  is a very difficult decision to  decide when and how to allow him to return to full work duty.  We will  increase his Imdur to 90.  We will restart him on 25 b.i.d. of her  metoprolol tartrate, and we will cut his Diovan dose down from 80 to 40.  I will see him back in no later than 2 weeks.  We will see about his  symptoms.  In the meantime, he remains on short-term disability, and our  notes will make it clear that he is on short-term disability and that we  will make further decisions when I see him back.     Carlena Bjornstad, MD, [facility]  Electronically Signed    JDK/MedQ  DD: 02/28/2008  DT: 02/28/2008  Job #: [id number]   cc:   Sandy Salaam. Shawna Orleans, DO

## 2011-04-20 NOTE — Assessment & Plan Note (Signed)
[place] OFFICE NOTE   NAME:[name] [name], [name] [name]                    MRN:          [id number]  DATE:11/08/2008                            DOB:          03/06/1946    Mr. Journigan is seen for followup.  In September of 2009 I saw the  patient and decided that I would review his films with Dr. Lia Foyer.  Dr.  Lia Foyer and I reviewed his films carefully.  We decided to repeat his  cardiac catheterization.  I discussed this with the patient in the  office on October 10, 2008.  Ultimately his catheterization was done on  October 29, 2008.  The patient has a 70% narrowing of the mid right  coronary artery that supplies the acute marginal which supplies the  distal portion of the inferior wall.  I met with Dr. Lia Foyer and  reviewed these films and we discussed his case at length.  It is not  known if this will help the patient symptomatically or not.  I brought  him back into the office today to have a full and complete discussion  with him.  His wife was present.   He is able to work.  He has chest discomfort at times.  His wife tells  me that he takes a great deal of nitroglycerin.  He had not told me  this.  It is hard to know if his symptoms represent definite angina.  We  talked about the pros and cons of proceeding with an intervention.  As  of today he and his wife decided that they want to go ahead.  I will  review the issue with Dr. Lia Foyer one last time.   PAST MEDICAL HISTORY:  Allergies MORPHINE.   MEDICATIONS:  Metformin, Imdur, Diovan, Amaryl, sertraline, aspirin,  Levemir insulin, simvastatin, Coreg, fish oil, gabapentin.   OTHER MEDICAL PROBLEMS:  See the complete list below.   REVIEW OF SYSTEMS:  Today he is having no fevers or chills.  He has no  GI or GU symptoms.  Has no headache or skin rashes.  Otherwise, his  review of systems is negative.  The patient's wife says that the patient  feels  fatigue all of the time.   PHYSICAL EXAM:  VITAL SIGNS:  Blood pressure today 118/60 with a pulse  of 59.  GENERAL:  The patient is oriented to person, time and place.  Affect is  normal.  His wife is present throughout the evaluation.  HEENT:  Reveals no xanthelasma.  He has normal extraocular motion.  NECK:  There are no carotid bruits.  There is no jugular venous  distention.  LUNGS:  Lungs are clear.  Respiratory effort is not labored.  CARDIAC:  Exam reveals an S1 and S2.  There are no clicks or significant  murmurs.  ABDOMEN:  His abdomen is soft.  He has no peripheral edema.   EKG today reveals no change.  He has old right bundle branch block.   PROBLEMS INCLUDE:  1. Obstructive sleep apnea.  I do not know if this plays a role  in his      overall fatigue.  2. History of depression and he is on sertraline.  3. Chronic fatigue.  4. Hyperlipidemia tolerating simvastatin.  5. Diabetes.  6. Good LV function.  7. History of palpitations.  He had sinus rhythm when he wore his      event recorder.  8. History of renal stones.  9. History of possible restless leg syndrome.  10.Old right bundle branch block.  11.Coronary artery disease.   As outlined in my multiple prior notes I have tried every medication  including a trial of Ranexa.  The patient then had his most recent cath.  It is possible that he can be helped with an intervention to his right  coronary artery.  He wants to proceed and his wife wants to proceed.  I  will be discussing this further with Dr. Lia Foyer.     Carlena Bjornstad, MD, [facility]  Electronically Signed    JDK/MedQ  DD: 11/08/2008  DT: 11/08/2008  Job #: [id number]   cc:   Sandy Salaam. Shawna Orleans, DO

## 2011-04-20 NOTE — Assessment & Plan Note (Signed)
[place] OFFICE NOTE   NAME:Engelmann, JARION PRANTE                    MRN:          [id number]  DATE:08/14/2008                            DOB:          11-04-1946    Since seeing Mr. Albrecht last, I have very carefully considered what  the next steps will be.  I considered arranging for repeat  catheterization after possible review of his films with Dr. Lia Foyer.  However, I suspect we will want physiologic data before making any  further decisions.  Therefore, I have decided to proceed with a stress  Myoview with the patient walking on the treadmill.  This will give [place] a  better idea of his overall exercise capacity and his overall status.  I  called him and spoke with him about this and he is in agreement.  It  will be arranged and then I will plan to see him in the office sometime  after that exercise test.     Carlena Bjornstad, MD, [facility]  Electronically Signed    JDK/MedQ  DD: 08/14/2008  DT: 08/14/2008  Job #: [id number]

## 2011-04-20 NOTE — Assessment & Plan Note (Signed)
[facility]                            CARDIOLOGY OFFICE NOTE   NAME:[name] [name], [name] [name]                    MRN:          [id number]  DATE:03/27/2008                            DOB:          29-Mar-1946    [name] [name] is seen for followup.  Unfortunately, he continues to have  chest discomfort.  I saw him last on March 11, 2008; at that time we  added amlodipine.  He has felt somewhat better, but then at night has  episodes of discomfort and feeling a racing heart beat.   PAST MEDICAL HISTORY:   ALLERGIES:  MORPHINE.   MEDICATIONS:  1. Metformin.  2. Imdur 90 mg.  3. Diovan 40 mg.  4. Amaryl 4 mg b.i.d.  5. Sertraline 100 mg b.i.d..  6. Aspirin.  7. Levemir insulin.  8. Simvastatin 40 mg; (and he is tolerating this recent change from      [place]).  9. Metoprolol (to be changed to carvedilol at a dose of 12.5 mg b.i.d.      and amlodipine 5 mg daily).   OTHER MEDICAL PROBLEMS:  See the list on my note of February 28, 2008.   REVIEW OF SYSTEMS:  Other than the HPI, his review of systems is  negative.   PHYSICAL EXAMINATION:  Blood pressure 124/77, pulse 72.  The patient is  oriented to person, time and place.  Affect is normal.  One of the  patient's daughters in the room.  HEENT:  Reveals no xanthelasma.  He has normal extraocular motion.  There are no carotid bruits.  There is no jugular venous distention.  LUNGS:  Clear.  Respiratory effort is not labored.  CARDIAC:  Exam reveals an S1 with an S2.  There are no clicks or  significant murmurs.  ABDOMEN:  Soft.  There are no masses or bruits.  EXTREMITIES:  There is no peripheral edema.   EKG:  (Today)  Shows sinus rhythm with an old right bundle branch block.   PROBLEMS:  1. Obstructive sleep apnea.  2. History of depression.  3. Hyperlipidemia.  We changed him from [place] to Zocor, and we will      give him some time on the medicine and then recheck his labs.  4. Diabetes.  5.  Coronary disease.  See the catheterization note from February 23, 2008.  Grafts are patent, but he had significant distal disease.  I      am continuing to titrate his medicines.  I had lowered his beta      blocker for a while, with some palpitations.  We will increase it,      but also switch him to carvedilol because that it is a better      profile with diabetes.  6. History of good left ventricular function.  7. History of some palpitations, and is it now time for him to wear an      event recorder.  8. History of renal stones.  9. Possible restless leg.  10.Old right bundle branch block.   The patient continues to be  disabled.  We will change his metoprolol to  carvedilol 12.5 mg b.i.d..  He will also wear an event recorder, and  then I will see him back to reassess his status.     Carlena Bjornstad, MD, [facility]  Electronically Signed    JDK/MedQ  DD: 03/27/2008  DT: 03/27/2008  Job #: [id number]

## 2011-04-20 NOTE — Assessment & Plan Note (Signed)
[place]                            CARDIOLOGY OFFICE NOTE   NAME:[name] [name], [name] [name]                    MRN:          [id number]  DATE:07/17/2008                            DOB:          1946/06/03    [name] [name] is seen for followup.  I saw him last on June 06, 2008.  Unfortunately, he continues to have some mild dizziness.  He also has  some chest tightness.  His activities are limited.  He is working  Scientist, product/process development.  It is very difficult to assess whether or not there is  anything we can do that could help him with his symptoms.  He has  diffuse disease.  We could consider further exercise testing.  Also, I  will consider repeat cath.  First of all, I will review the films with  Dr. Lia Foyer understanding that he is having unrelenting symptoms.   PAST MEDICAL HISTORY:   ALLERGIES:  MORPHINE.   MEDICATIONS:  Metformin, Imdur, Diovan, Amaryl, sertraline, aspirin,  Levemir insulin, simvastatin, Coreg, fish oil, and Ranexa 1000 mg b.i.d.   OTHER MEDICAL PROBLEMS:  See the list on the note of June 06, 2008.   REVIEW OF SYSTEMS:  Other than the HPI, his review of systems is  negative.   PHYSICAL EXAMINATION:  VITAL SIGNS:  Blood pressure is 133/72 with a  pulse of 64.  GENERAL:  The patient is oriented to person, time, and place.  Affect is  normal.  HEENT:  No xanthelasma.  He has normal extraocular motion.  NECK:  There are no carotid bruits.  There is no jugular venous  distention.  LUNGS:  Clear.  RESPIRATORY:  Effort is not labored.  CARDIAC:  S1 and S2.  There are no clicks or significant murmurs.  ABDOMEN:  Soft.  There is no peripheral edema.   No labs were done today.   Problems are listed on the note of June 06, 2008.  He has continued  symptoms of chest discomfort.  I will not change his medicines.  I will  review the films with Dr. Lia Foyer and call [name] [name].  I will then  decide if he will have repeat exercise test or repeat cath  with the  possibility of intervening at this time.     [name] Bjornstad, MD, [facility]  Electronically Signed   JDK/MedQ  DD: 07/17/2008  DT: 07/18/2008  Job #: [id number]   cc:   Sandy Salaam. Shawna Orleans, DO

## 2011-04-20 NOTE — Assessment & Plan Note (Signed)
[place] OFFICE NOTE   NAME:Poellnitz, DANNELL LETSCH                    MRN:          [id number]  DATE:08/29/2008                            DOB:          Sep 05, 1946    Mr. Zahnow is here for followup.  I saw him last on July 17, 2008.  At that time, I told him that I would review films with Dr. Lia Foyer.  After further careful review, I decided that it was most appropriate to  have the patient undergo nuclear exercise testing again to see if we  could prove a definite area of ischemia.  This study was done.  The  patient walked 9-1/2 minutes on the Bruce protocol reaching 10.9 METs.  This did represent 80% predicted maximum heart rate.  He had chest  cramping.  His O2 sat started at 98%.  There was slight decrease  overtime, but in general, they did not drop below 94% for any length of  time.  He did have some chest cramping.  His EKG did reveal 1 mm of ST  depression in V2-V4 at peak exercise that was flat to downsloping.  This  was nondiagnostic due to baseline changes.  The nuclear images revealed  no definite evidence of significant scar or ischemia.   Mr. Loflin is now back for followup.  He is managing, but has  significant symptoms during many days when he is completely exhausted  when he returns home from work.  During some of these days, he has had  several episodes of chest tightness as the day has gone on.  It is very  hard to know if his symptoms are from ischemia or not, but they are  limiting to him.   PAST MEDICAL HISTORY:   ALLERGIES:  MORPHINE.   MEDICATIONS:  1. Metformin 1000 b.i.d.  2. Imdur 90 mg daily.  3. Diovan 40 daily.  4. Amaryl 4 b.i.d.  5. Sertraline 100 b.i.d.  6. Aspirin 325.  7. Levemir insulin.  8. Simvastatin 40.  9. Coreg 12.5 b.i.d.  10.Fish oil.   OTHER MEDICAL PROBLEMS:  See the complete list below.   REVIEW OF SYSTEMS:  Other than the HPI, his review of  systems is  negative.   PHYSICAL EXAMINATION:  Weight is 185 pounds.  Blood pressure is 132/68  with a pulse of 72.  The patient is oriented to person, time, and place.  Affect is normal.  HEENT reveals no xanthelasma.  He has normal  extraocular motion.  There are no carotid bruits.  There is no jugular  venous distention.  Lungs are clear.  Respiratory effort is not labored.  Cardiac exam reveals an S1 with an S2.  There are no clicks or  significant murmurs.  The abdomen is soft.  He has no significant  peripheral edema.  No labs are done today.  As mentioned in the HPI, the  patient has had a nuclear scan since his last visit.  There is some ST  change that is not diagnostic and the nuclear images reveal no marked  ischemia and his  left ventricular function continues to be good.   Problems include,  1. Obstructive sleep apnea.  I do not know if it is possible if this      is playing any role in his overall general fatigue.  2. History of depression.  He is on sertraline for this.  3. Hyperlipidemia and he is tolerating simvastatin.  4. Diabetes.  This is treated.  5. Good left ventricular function.  6. History of palpitations.  He had sinus rhythm when he wore his      event recorder.  7. History of renal stones.  8. History of possible restless legs.  9. Old right bundle-branch block.  10.*Coronary artery disease.  In March 2009, his grafts were patent.      He had significant distal disease.  Since that time, I have tried      at great length to titrate his medications.  This included using      Ranexa.  We finally decided that Ranexa was not helping him.  His      Imdur is at 90 mg daily.  Coreg is at 12.5 b.i.d.  I do not think      his symptoms are related to tachycardia.  I do not see that any      other options that we have in terms of pushing his medications.  I      have thought about the possibility of a cardiopulmonary exercise      test.  I am not convinced that  this will help [place] at this point.  I      will review the patient's March cath films with Dr. Lia Foyer.  We      will talk about whether there will be advantage in proceeding with      repeat cath for the possibility of looking for new lesions and/or      the possibility of whether any of his prior lesions can be dilated      possibly leading to improve.  I will review this with Dr. Lia Foyer      soon and be back in touch with the patient.  I have spent an      extensive amount of time reviewing and rereviewing the patient's      symptoms and discussing them with him and considering all options.     Carlena Bjornstad, MD, [facility]  Electronically Signed    JDK/MedQ  DD: 08/29/2008  DT: 08/29/2008  Job #: [id number]   cc:   Sandy Salaam. Shawna Orleans, DO

## 2011-04-20 NOTE — Discharge Summary (Signed)
[name], [name] [name]             ACCOUNT NO.:  [id number]   MEDICAL RECORD NO.:  [id number]          PATIENT TYPE:  INP   LOCATION:  6526                         FACILITY:  [place]   PHYSICIAN:  Carlena Bjornstad, MD, FACCDATE OF BIRTH:  [birth year]   DATE OF ADMISSION:  11/26/2008  DATE OF DISCHARGE:  11/27/2008                               DISCHARGE SUMMARY   DISCHARGING DIAGNOSIS:  Coronary artery disease, status post  percutaneous coronary intervention with a drug-eluting stent to the  right coronary artery.  The patient received XIENCE V stent on November 26, 2008   PAST MEDICAL HISTORY:  Coronary artery disease, diabetes, depression,  obstructive sleep apnea, and right bundle-branch block.   HOSPITAL COURSE:  [name] was seen in the office on November 08, 2008, by Dr. [name] with complaints of increasing angina.  Dr. [name] felt  the patient needed further evaluation.  Discussed the patient's coronary  anatomy with Dr. Lia Foyer who agreed to intervene on RCA.  The patient  brought in on date of admission, underwent PCI as stated above,  tolerated the procedure without complications, seen by Dr. [name] on day  of discharge.  Per Dr. [name], the patient is stable to be discharged to  home.  The patient continue prehospitalization.   MEDICATIONS:  Metformin, can resume on November 29, 2008.   OTHER MEDICINES:  1. Aspirin 325.  2. Plavix 75.  3. Isosorbide mononitrate 90 daily.  4. Carvedilol 12.5 b.i.d.  5. Diovan 40.  6. Amaryl 4 mg as previously prescribed.  7. Levemir insulin as previously prescribed.  8. Simvastatin 40 mg daily.  9. Fish oil 1 g daily.  10.Gabapentin 600 mg at bedtime.  11.Zoloft 100 mg b.i.d.  12.Nitroglycerin as needed.   The patient has received a prescription for the Plavix and the  nitroglycerin.  He has been given a post cardiac catheterization.   DISCHARGE INSTRUCTIONS:  Follow up with Dr. [name] December 12, 2008, at  12:15.   DURATION OF  DISCHARGE/ENCOUNTER:  Less than 30 minutes.      Rosanne Sack, ACNP      Carlena Bjornstad, MD, [facility]  Electronically Signed    MB/MEDQ  D:  11/27/2008  T:  11/27/2008  Job:  [id number]

## 2011-04-20 NOTE — Assessment & Plan Note (Signed)
[place] OFFICE NOTE   NAME:[name] [name], [name] [name]                    MRN:          [id number]  DATE:06/06/2008                            DOB:          [birth year]    HISTORY:  [name] [name] is here for followup.  Managing his coronary  disease has been very difficult.  The patient has severe diffuse  disease.  We had started him on Ranexa 500 mg b.i.d. on Apr 15, 2008.  This seems to be helping him.  Therefore, I gave him permission to  return to work.  He has worked Scientist, product/process development.  However, at the end of some  days, he has significant pain.  Also, he has some mild dizziness when he  leans over and then stands back up, and then stretches up.  He has not  had any syncope.  We know from his last catheterization in March 2009  that his grafts were patent, but he had significant distal disease.   PAST MEDICAL HISTORY:   ALLERGIES:  MORPHINE.   MEDICATIONS:  1. Metformin.  2. Imdur.  3. Diovan.  4. Amaryl.  5. Sertraline.  6. Aspirin.  7. Levemir.  8. Simvastatin.  9. Coreg.  10.Ranexa 500 b.i.d.  11.Fish oil.   OTHER MEDICAL PROBLEMS:  See the list below.   REVIEW OF SYSTEMS:  Other than the HPI, his review of systems is  negative.   PHYSICAL EXAMINATION:  VITAL SIGNS:  Blood pressure is 116/72 with a  pulse of 75.  GENERAL:  The patient is oriented to person, time, and place.  Affect is  normal.  He is here with his daughter today.  HEENT:  Reveals no xanthelasma.  He has normal extraocular motion.  NECK:  There are no carotid bruits.  There is no jugular venous  distention.  LUNGS:  Clear.  Respiratory effort is not labored.  CARDIAC:  Reveals S1 and S2.  There are no clicks or significant  murmurs.  ABDOMEN:  Soft.  He has no significant peripheral edema.   PROBLEMS:  1. Obstructive sleep apnea.  2. History of depression.  3. Hyperlipidemia.  He is tolerating Zocor.  4. Diabetes.  5. Coronary  disease.  Last cath was in March 2009.  The only option as      of today is to try to push his Ranexa up to the higher dose.  He is      on 500 b.i.d..  He will be increased to 1000 in the morning and 500      in the evening and then 1000 b.i.d., and I will see him back for      followup.  We cannot push his other medicines because of his blood      pressure and symptomatology with borderline orthostasis.  Further      down the line, we will have to reconsider whether there is any      possibility that there is a culprit lesion that could be      approached.  Also disability certainly could be considered at some  point.  6. Good left ventricular function.  7. History of palpitations.  He had sinus rhythm when he wore an event      recorder.  8. History of renal stones.  9. Possible restless leg.  10.Old right bundle branch block.   Ranexa dose has to be increased.  I will see him back in followup.     Carlena Bjornstad, MD, [facility]  Electronically Signed    JDK/MedQ  DD: 06/06/2008  DT: 06/07/2008  Job #: [id number]   cc:   Sandy Salaam. Shawna Orleans, DO

## 2011-04-20 NOTE — Assessment & Plan Note (Signed)
HEALTHCARE                            CARDIOLOGY OFFICE NOTE   NAME:Pollina, BACH DEEMS                    MRN:          [id number]  DATE:02/21/2008                            DOB:          October 30, 1946    Mr. Scherff has known coronary disease.  He underwent CABG in the past.  In 2007 a catheterization showed that his grafts were patent.  In 2008 a  Myoview scan revealed question of a slight abnormality.  He stabilized  and he has done well since then.  I saw him in September and he had some  of his burning chest discomfort that he has had intermittently.  However, more recently he has had an increase in this chest pain.  He  has prolonged episodes and takes nitroglycerin.  It has limited  activities both at work and with his home activities.   The pain occurs in his chest.  He has radiation to his arm.  Sometimes  he feels it in both shoulders.   PAST MEDICAL HISTORY:  Allergies:  MORPHINE.   Medications:  1. Imdur 60 mg.  2. Advicor 1000/40 mg.  3. Aspirin 325 mg.  4. Metformin 1000 mg b.i.d.  5. Amaryl 4 mg b.i.d.  6. Sertraline 100 mg b.i.d.  7. Gabapentin 300 mg in the evening.  8. Metoprolol extended release 25 mg.  9. Levemir 35 units.  10.Diovan 80 mg.   Other medical problems:  See the list below.   REVIEW OF SYSTEMS:  Other than the HPI, his review of systems is  negative.   Laboratory data reveals old right bundle branch block with sinus rhythm.   PHYSICAL EXAM:  Weight is 179 pounds.  Blood pressure is 125/59 with a  pulse of 81.  The patient is oriented to person, time and place.  Affect is normal.  HEENT:  No xanthelasma.  He has normal extraocular motion.  There are no  carotid bruits.  There is no jugular venous distention.  LUNGS:  Clear.  Respiratory effort is not labored.  CARDIAC:  An S1 with an S2.  There are no clicks or significant murmurs.  ABDOMEN:  Soft.  There are no masses or bruits.  There is no peripheral  edema.   PROBLEMS:  1. Obstructive sleep apnea with surgery in the past.  2. History of some depression.  3. Hyperlipidemia.  4. Diabetes.  5. Coronary disease.  He is post coronary artery bypass grafting in      the past.  Grafts were patent in 2007.  I am concerned about his      symptoms at this time.  There is been a definite increase.  He is      on medications.  I am hesitant to do stress testing.  I feel that a      catheterization is appropriate and he is in agreement and this will      be arranged.  6. History of good left ventricular function.  7. Question of some palpitations.  We will talk about it more in the      future.  8. History of renal stones.  9. Possible restless legs.  10.Old right bundle branch block.   Catheterization will be arranged as an outpatient and I will see him  back in follow-up.     Carlena Bjornstad, MD, [facility]  Electronically Signed    JDK/MedQ  DD: 02/21/2008  DT: 02/22/2008  Job #: [id number]   cc:   Sandy Salaam. Shawna Orleans, DO

## 2011-04-20 NOTE — Cardiovascular Report (Signed)
[name] [name], [name] [name]             ACCOUNT NO.:  [id number]   MEDICAL RECORD NO.:  [id number]          PATIENT TYPE:  INP   LOCATION:  6526                         FACILITY:  [facility]   PHYSICIAN:  Loretha Brasil. Lia Foyer, MD, FACCDATE OF BIRTH:  14-Aug-1946   DATE OF PROCEDURE:  11/26/2008  DATE OF DISCHARGE:                            CARDIAC CATHETERIZATION   INDICATIONS:  Mr. Kerns is followed closely by Dr. [name].  He  underwent diagnostic catheterization about a month ago.  He had prior  revascularization surgery.  The study demonstrated a moderate stenosis  of a mid right coronary that supplied an acute marginal branch which  supplies the distal portion of the inferior wall.  Dr. [name] and I  reviewed the films in detail.  He continue to follow him medically, but  his nitroglycerin usage was frequent.  The patient and the patient's  wife discussed the situation in the office in detail and felt that they  would prefer to proceed with percutaneous intervention of the mid right  coronary.  Risks, benefits, and alternatives were discussed with the  patient in detail and he consented to proceed.  Importantly, the patient  was given 600 mg of oral clopidogrel several hours prior to the  procedure.   PROCEDURE:  Percutaneous stenting of the right coronary artery leading  into the acute marginal branch.   DESCRIPTION OF PROCEDURE:  The patient was brought to the Cath Lab after  informed consent and prepped and draped in the usual fashion.  Through  an anterior puncture, femoral artery was entered and a 6-French sheath  was placed.  Bivalirudin was given according to protocol.  ACT was  checked and found to be appropriate.  A JR-4 guiding catheter with side  holes was utilized with an appropriate ACT, the Prowater wire was then  advanced down across the lesion and the vessel predilated using a 2.25 x  12 apex balloon.  A 2.75 x 23 Abbott Xience V drug-eluting stent was  then passed to the  site of narrowing.  This was then inflated to  approximately 12-13 atmospheres.  This was followed by high pressure  balloon dilatation using a 3-mm noncompliant Reid Hope King Voyager balloon  throughout the course of the stent.  This was removed and final followup  views obtained.  There were no major complications.  All catheters were  removed and the femoral sheath was sewn into place for sheath removal in  2 hours.   ANGIOGRAPHIC DATA:  The right coronary artery is a moderate-sized  vessel.  The acute marginal branch is supplied by a fairly large mid  right vessel.  There is a 75% area of segmental plaquing in the  midvessel, tightest at one small location.  Slightly more distally,  there is a small area of about 30% narrowing well beyond the treatment  site.  Following stenting, this is reduced to 0% with an excellent  angiographic appearance.  There were no major complications.   CONCLUSION:  Successful percutaneous stenting of the distal right  coronary artery as noted above.   DISPOSITION:  The patient will be discharged tomorrow  and follow up with  Dr. [name].     Loretha Brasil. Lia Foyer, MD, [facility]  Electronically Signed    TDS/MEDQ  D:  11/26/2008  T:  11/27/2008  Job:  [id number]   cc:   Carlena Bjornstad, MD, [facility]

## 2011-04-20 NOTE — Assessment & Plan Note (Signed)
[facility] OFFICE NOTE   NAME:[name] [name], [name] [name]                    MRN:          [id number]  DATE:12/12/2008                            DOB:          [birth year]    [name] [name] is back for followup.  When I saw him last on November 08, 2008, we finalized the plan for him to undergo an intervention by Dr.  Lia Foyer.  This was done on November 26, 2008.  He received successful  stenting of the distal right coronary artery leading to the acute  marginal branch.  He received a drug-eluting stent.  He felt better  immediately and has continued to feel well since then.  He is very  pleased with his overall progress as I am.   PAST MEDICAL HISTORY:   ALLERGIES:  MORPHINE.   MEDICATIONS:  See the flow sheet.   REVIEW OF SYSTEMS:  He has no GI or GU symptoms.  He has no headaches,  fevers, chills, or skin rashes.  His review of systems is negative.   PHYSICAL EXAMINATION:  VITAL SIGNS:  Blood pressure 114/56 with a pulse  of 77.  GENERAL:  The patient is oriented to person, time, and place.  Affect is  normal.  HEENT:  No xanthelasma.  He has normal extraocular motion.  NECK:  There are no carotid bruits.  There is no jugular venous  distention.  LUNGS:  Clear.  Respiratory effort is not labored.  CARDIAC:  An S1 with an S2.  There are no clicks or significant murmurs.  ABDOMEN:  Soft.  EXTREMITIES:  He has no peripheral edema.   No labs were done today.   [name] [name]' coronary status is stable.  See the prior problem list.  He underwent PCI on November 26, 2008 and he is doing very well.  No  change in his therapy at this time.  I will see him back in 6 months.     Carlena Bjornstad, MD, [facility]  Electronically Signed    JDK/MedQ  DD: 12/12/2008  DT: 12/13/2008  Job #: [id number]

## 2011-04-20 NOTE — Assessment & Plan Note (Signed)
[facility] OFFICE NOTE   NAME:Aguiniga, DEMARYIUS MEININGER                    MRN:          [id number]  DATE:03/11/2008                            DOB:          [birth year]    Mr. Buerkle is here for followup.  When I saw him last on February 28, 2008, unfortunately he was still not doing well.  He has had increase in  his distal coronary disease.  He was cathed on February 23, 2008 by Dr.  Lia Foyer, and it is felt that he has distal LAD disease that is very  significant.  There is a 50-60% mid right.  I have a written note from  the patient's wife saying that he gets short of breath very easily and  that in addition he is not sleeping and he is unfortunately very  irritable with all this.  He says that he is limited.  He has spoken to  his employers.  They are being understanding about his current ongoing  change in therapy but that the only job that is available for him will  be his job working at [employer].   ALLERGIES:  MORPHINE.   MEDICATIONS:  THE PATIENT IS TO CALL BACK TO RECONFIRM HIS MEDICATIONS.  As I understand them at this time he is on:  1. Aspirin 325.  2. Metformin 1000 b.i.d.  3. Amaryl 4 b.i.d.  4. Sertraline 100 b.i.d.  5. Levemir  35 units.  6. Diovan (? I had encouraged him to lower it from 80 to 40, but I do      not know if this was done).  7. Advicor.  8. Metoprolol, recently increased to 25 b.i.d.  9. Imdur, recently increased to 90 mg daily.   OTHER MEDICAL PROBLEMS:  See the complete list on the note of February 28, 2008.   REVIEW OF SYSTEMS:  Other than the HPI, his review of systems is  negative.   PHYSICAL EXAMINATION:  VITAL SIGNS:  Blood pressure is 102/64 with a  pulse of 77.  GENERAL:  The patient is oriented to person, time and place.  Affect is  normal.  He is here with his daughter today who actually brought the  note from his wife to be sure that we received this note in an  attempt  just to be caring.  HEENT:  No xanthelasma.  He has normal extraocular motion.  There are no  carotid bruits.  There is no jugular venous tension.  LUNGS:  Clear.  Respiratory effort is not labored.  CARDIAC:  S1 with an S2.  There are no clicks or significant murmurs.  ABDOMEN:  Soft.  He has no peripheral edema.   EKG is not showing any acute change.  He still has a right bundle branch  block.   PROBLEMS:  Problems are listed on my note of February 28, 2008.  #5.  Coronary disease.  He continues to have significant distal disease  with symptoms.  At this point, I want to add amlodipine to his  medicines.  When he calls back,  I hope to try to be sure that he is on  Imdur 90, a new dose of amlodipine 5, Lopressor 25 b.i.d., and that his  Diovan has been cut from 80 to 40.  Also, he has been on Advicor for a  long period of time.  I doubt that this is optimal therapy for him.  He  will be changed to simvastatin 40 daily.     Carlena Bjornstad, MD, [facility]  Electronically Signed    JDK/MedQ  DD: 03/11/2008  DT: 03/11/2008  Job #: [id number]   cc:   Sandy Salaam. Shawna Orleans, DO

## 2011-04-20 NOTE — Assessment & Plan Note (Signed)
[facility] OFFICE NOTE   NAME:[name], [name] RIVES                    MRN:          [id number]  DATE:05/03/2008                            DOB:          May 28, 1946    [name] [name] is here for followup.  His brother, who had been quite ill  with coronary disease, has passed away and the funeral is today.  Mr.  [name] is appreciative of everyone's care, and he is dealing with his  brother's death adequately.  I had started him on Ranexa on Apr 15, 2008.  He has been helped by this and is doing better.  His activity  level is up and overall he is doing better.  He has some chest tightness  if he pushes himself.  This is to be expected with his anatomy.  Otherwise, he is doing well.   PAST MEDICAL HISTORY:   ALLERGIES:  MORPHINE.   MEDICATIONS:  Metformin, Imdur, Diovan, Amaryl, sertraline, aspirin,  ________, simvastatin, amlodipine, Coreg, and Ranexa 500 p.o. b.i.d.   OTHER MEDICAL PROBLEMS:  See the complete list on my note of Apr 15, 2008.   REVIEW OF SYSTEMS:  Today his review of systems is negative other than  the HPI.   PHYSICAL EXAMINATION:  Blood pressure is 116/70 with a pulse of 76.  The patient is oriented to person, time, and place.  Affect is normal.  HEENT:  Reveals no xanthelasma.  He has normal extraocular  motion.  There are no carotid bruits.  There is no jugular venous distension.  LUNGS:  Clear.  Respiratory effort is not labored.  CARDIAC EXAM:  Reveals an S1 with an S2.  There are no clicks or  significant murmurs.  ABDOMEN:  Soft.  EXTREMITIES:  No peripheral edema.  EKG reveals right bundle branch block.  The rate is controlled.  His QT  interval corrected is 480 ms.   [name] [name] is better.  Ranexa appears to be helping him.  I will not  change his medicines further.  Problems are listed on my note of December 16, 2007.  The patient has significant coronary disease with  distal  disease.  We need to follow him and adjust his meds for his symptoms;  however, it is good for him to return to work and he can return on  Monday.  I do not want him to lift more than 40 pounds.     [name] Bjornstad, MD, [facility]  Electronically Signed    JDK/MedQ  DD: 05/03/2008  DT: 05/03/2008  Job #: [id number]

## 2011-04-20 NOTE — Assessment & Plan Note (Signed)
[name]                                 ON-CALL NOTE   NAME:[name], [name]                      MRN:          [id number]  DATE:05/30/2007                            DOB:          02-28-1946    TIME RECEIVED:  09:33 p.m.   CALLER  His wife.   TELEPHONE:  [phone]   The patient has had pleuritic type pain in his lungs and hives all over  his body for the past 36 hours.  He is not short of breath at all.  There is no tongue-swelling, no difficulty breathing at all, although he  has a mild sharp pain in his chest when he takes a deep breath.  He was  seen at urgent care earlier today and it was felt this was an allergic  type reaction.  He was given a shot of Benadryl, as well as tablets of  hydroxyzine and he has been taking hydroxyzine every four hours, but is  really no better.  He is no worse either.  His wife would like to have  some advice.  He is on no new medications recently.  He has never done  this before.   My advice is, as long as he is maintaining and breathing comfortably, he  can be seen in Dr. Lora Havens office tomorrow morning.  Otherwise, if he  develops any swelling in his throat or tongue tonight, or if he develops  difficulty breathing, she should take him immediately to the emergency  room.     [name]. Sarajane Jews, MD  Electronically Signed    SAF/MedQ  DD: 05/31/2007  DT: 05/31/2007  Job #: [id number]   cc:   Sandy Salaam. Shawna Orleans, DO

## 2011-04-23 NOTE — Discharge Summary (Signed)
[facility]  Patient:    [name] [name]                     MRN: [id number] Adm. Date:  [id number] Disc. Date: 01/07/98 Attending:  [name]. Iii Dictator:   Beverly A. Johny Blamer, R.N., N.P.                  Referring Physician Discharge [facility]  HISTORY OF PRESENT ILLNESS:  This is a 65 year old white male patient with known prior history of ASCVD.  He apparently has adult onset diabetes.  He sees a primary physician in [place], [place], Dr. Redmond Pulling.  He apparently had chest pain and came to the ER and was admitted.  HOSPITAL COURSE:  He was put on IV heparin, aspirin, and Plavix. On January 03, 1998, he underwent cardiac catheterization by Dr. Quay Burow which revealed a 90% stenosis in a diagonal-2 and a 90% stenosis in LAD.  He underwent PTCA of both those areas.  He was given ReoPro.  Both areas were reduced to less than 10%.  Post sheath removal on ambulating, he had recurrent chest pain, thus it was decided that he should undergo relook catheterization.  On January 06, 1998, he had relook catheterization.  His left main was normal, his LAD had a long intramyocardial segment, and he had a 70-75% distal restenosis.  It was a very small vessel.  He had 40-50% in his diagonal that had undergone PTCA.  His left circumflex was within normal limits, his RCA was within normal limits, and his LV function was within normal limits.  Thus, he was put on Norvasc 5 mg every day.  He was seen by Dr. Quay Burow on January 07, 1998.  His right groin was without hematoma or bruise, he was considered stable, and discharged home.  His telemetry during his hospitalization showed a sinus rhythm to sinus bradycardia with a rate of 55 to 74, thus a beta-blocker was not introduced.  He did not have an MI.  LABORATORY AND X-RAY DATA:  His labs revealed CK-MBs negative x 3, troponins negative.  He did undergo a lipid panel on January 03, 1998.  His total  cholesterol was 156, his triglycerides were 156, his HDL was 33, and his LDL was 90.  His total cholesterol-HDL was 4.7.  His BMP on January 03, 1998, showed a sodium of 142, potassium 4.2, BUN 14, creatinine 0.9, glucose 165. Hemoglobin was 14.6, hematocrit of 39.5, MCV was 82.8, and his platelets were 167,000.  His x-ray showed a normal chest.  DISCHARGE MEDICATIONS: 1. Aspirin 325 mg once a day. 2. Glucophage 500 mg twice a day with meals.  (He may restart that    on January 09, 1998.) 3. Micronase as previously prescribed. 4. Norvasc 5 mg once per day. 5. Nitrostat p.r.n. sublingually for chest pain symptoms. 6. After reviewing his lipid profile with himself and his wife, it    was decided to start him on Niaspan.  He will be given two    starter packets from my office and he will take two weeks of    each increasing dosage.  He will have his lipids rechecked and    his LFTs rechecked when he reaches 500 mg after two weeks.  He    will then go on to the 875 mg.  He will then be given a    prescription for 1000mg  every day.  He was instructed on  possible side effects and how to alter his medication taking to    avoid these. 7. Vitamin E 400 international units every day. 8. A multivitamin or a B complex vitamin.  DISCHARGE INSTRUCTIONS:  Activity:  He will do no strenuous activity, no lifting greater than five pounds, and no driving for three days.  DIET:  He will be on a low-fat diet.  His wife was also instructed by me on a low-fat diet.  I will see her in the office and give her more information about reading food labels and such.  FOLLOWUP:  He will see Dr. Gwenlyn Found in about two weeks.  DISCHARGE DIAGNOSES: 1. _______ cardiovascular disease.    A. Status post cardiac cath with percutaneous transluminal       coronary angioplasty of two vessels.  His diagonal is       reduced from 90 down to less than 10 his left anterior       descending artery from 90 to less than  10.  His left       ventricular function was normal and his left internal       mammary artery was okay.  He has no other blockages.    B. He then went on to have recurrent chest pain with walking.       He was recathed on January 06, 1998.  He had some restenosis       in his left anterior descending artery of 70-75%.  It was a       small vessel and it was decided to try to treat him       medically.  Also, a 40-50% diagonal restenosis. 2. Low high-density lipoprotein syndrome. 3. Normal left ventricular function. 4. Adult onset diabetes mellitus. 5. Sinus bradycardia. DD:  01/07/98 TD:  01/07/98 Job: [id number]

## 2011-04-23 NOTE — Assessment & Plan Note (Signed)
[facility] OFFICE NOTE   NAME:[name], [name] [name]                    MRN:          [id number]  DATE:09/03/2008                            DOB:          Mar 01, 1946    I have reviewed the cath films with Dr. Lia Foyer.  I saw the patient last  on August 29, 2008.  We decided that I would discuss with Dr. Lia Foyer  and then get back in touch with the patient.  The review of the  angiograms reveal that the patient's grafts were patent.  He had  significant distal disease.  His native right is patent but it is a  diffusely diseased vessel and the graft to that vessel is patent.  The  LAD is diffusely diseased and the LIMA is patent to it.  The vein grafts  to diagonal 1 are also patent.  There is competitive flow in different  systems.  There are no areas that would be approachable.  It would be  very difficult to decide which potentially small area of a diffusely  diseased vessel could possibly be causing any symptoms.  Most probably  it is all of the areas.  Dr. Lia Foyer mentioned that with this being the  case, that Ranexa may well have been a good choice for him.  I reminded  him that we thought that the Renexa ultimately probably did not help.   Dr. Lia Foyer and I decided that it could be possible that the patient's  anatomy has changed.  I will discuss this with the patient.  If we  decide to do repeat catheterization, the plan will be to see if there  has been a significant change since his prior study.  If so and if this  significant change could be approached with an intervention, then  this  could be done.  If there has not been a significant change, there would  be no plan to try to intervene.   I will talk with [name] about this.     Carlena Bjornstad, MD, [facility]  Electronically Signed    JDK/MedQ  DD: 09/03/2008  DT: 09/03/2008  Job #: [id number]

## 2011-04-23 NOTE — Cardiovascular Report (Signed)
[facility]  Patient:    [name] [name]                     MRN: [id number] Proc. Date: 01/12/99 Adm. Date:  [id number] Attending:  [name]. Iii CC:         [name], [name]             Dr. Reginia Naas Fan[place]Wilks in [place], [place]                        Cardiac Catheterization  PROCEDURE:                    Left heart catheterization.  INDICATIONS FOR TEST:         The patient is a 65 year old male who has had two  stents placed previously, one in his LAD and one in the second diagonal vessel. He presented with a 1-2 week history of exertional chest pressure associated with shortness of breath.  He described it as a cramping sensation, similar to what e had when he had the previous cardiac problem.  DESCRIPTION OF PROCEDURE:     The patient was prepped and draped in the usual sterile fashion, exposing the right groin and applying local anesthetic with 1%  Xylocaine.  The standard technique was employed with a 6-French introducer sheath and was placed in the right femoral artery.  Selective right and left coronary arteriography and ventriculography in the RAO projection was performed.  RESULTS:  HEMODYNAMIC MONITORING:       Central aortic pressure was 112/57, left ventricular pressure 113/11.  There was no aortic valve gradient noted on pull back.  VENTRICULOGRAPHY:             Ventriculography in the RAO projection using 25 cc of contrast media at 12 cc per second revealed normal opacification of the left ventricle.  There was normal left ventricular systolic function.  Ejection fraction calculated at 70%.  Premature ventricular contractions were noted and it was associated with mitral regurgitation, but only with the premature ventricular contractions.  The end-diastolic pressure was 12.  CORONARY ARTERIOGRAPHY:       On fluoroscopy, there were stents noted in the distribution of the LAD.   #1 - Left main normal.                                #2 - LAD.  The LAD was a medium size vessel extending down to the apex of the heart.  It gave rise to two diagonal branches.  In the proximal portion of the LAD, there was a stent that was widely patent.  The mid and distal LAD were free of significant disease.                                #3 - First diagonal - widely patent.                                #4 - Second diagonal.  The second diagonal had a stent in its proximal segment which was less than 30% ostial narrowing.  The distal portion of the second diagonal was a very small vessel with moderate irregularities.  It is unclear whether  this __________ represents diffuse coronary disease or just a very small diameter vessel.  In any event, it was too small to consider any type of intervention.  The stent is widely patent.                                #5 - Circumflex.  The circumflex gave rise to a single marginal vessel.  The system was free of significant disease.                                #6 - Right coronary artery.  The right coronary artery is a large dominant vessel with minor irregularities.  CONCLUSION:                   1. Normal left ventricular systolic function.                               2. Widely patent stent of both the left anterior                                  descending and second diagonal.                               3. Minor irregularities in the left anterior                                  descending and right coronary artery.  DISCUSSION:                   At this point, there is nothing to indicate that is chest pain is secondary to coronary artery disease.  If he is having anginal symptoms, it would be only by the distal second diagonal which is a very small vessel, too small to treat otherwise except for medical therapy.                                Another possibility for his chest pain would be Glucophage, since in some  people this can cause GI discomfort that mimics cardiac pain.  At this point, I have stopped his intravenous nitroglycerin and heparin,  added Imdur, and will continue Plavix, and he will be discharged to home in the  morning. DD:  01/12/99 TD:  01/13/99 Job: [id number]

## 2011-04-23 NOTE — Discharge Summary (Signed)
[name] [name], [name] [name]             ACCOUNT NO.:  [id number]   MEDICAL RECORD NO.:  [id number]          PATIENT TYPE:  INP   LOCATION:  6526                         FACILITY:  [facility]   PHYSICIAN:  [name], [name]   DATE OF BIRTH:  24-Mar-1946   DATE OF ADMISSION:  01/18/2005  DATE OF DISCHARGE:  01/20/2005                                 DISCHARGE SUMMARY   CHIEF COMPLAINT:  Ms. Dunner is a 65 year old male patient admitted for  chest pain, known history of CAD since 1999, stent to the LAD and diagonal  in 1999.  Subsequent CABG surgery in 2000.  Last catheterization due to  recurrent chest pain in June of 2005, all grafts were patent.  30% proximal  lesion in the SVG to the second diagonal.  This was not different when  compared to his cardiac catheterization in 2003 and in June of 2005, it was  felt his chest pain was noncardiac in origin.   The patient's recent history relating to chest pain for much of the day of  admission began at work.  He works as a Set designer.  He  described this as crampy and sharp in discomfort with left anterior upper  chest radiating to the left arm.  It was associated with dyspnea,  diaphoresis, and nausea, lasted throughout the day.  Took nitroglycerin with  minimal effect on the chest pain.  He was chest pain-free by the time he  arrived in the ER.  Please refer to the history and physical for additional  specifics noting that the initial point of care enzymes were negative.  Labs  were normal and EKG was unremarkable except for sinus rhythm and right  bundle branch block, no ischemia.  The patient was admitted by Broadus John, [name] with the following diagnosis.   ADMISSION DIAGNOSES:  1.  Chest pain, rule out unstable angina.  2.  Known history of coronary artery disease, status post coronary artery      bypass graft in 2000.  Last catheterization in 2005 showed      nonobstructive disease in the SVG to the diagonal  graft.  3.  Insulin-dependent diabetes mellitus.  4.  Dyslipidemia.  5.  Depression.  6.  Obstructive sleep apnea, status post corrective surgery.   HOSPITAL COURSE:  Problem 1.  Chest pain.  The patient was admitted, started  on routine aspirin, beta blockers and heparin.  Dr. Tamala Julian saw him the  following morning, got a history out of him relating to exertional fatigue  for 2 months, no energy.  Yesterday had exertional chest burning similar to  pre-CABG symptoms.  Dr. Tamala Julian felt this was atypical presentation of chest  pain.  Because of the fatigue, recommended checking a TSH, performing a  stress Cardiolite, checking a BNP to rule out atypical presentation of heart  failure, and to continue with IV nitroglycerin.  BNP was less than 30.  Cardiolite was performed on January 21, 2004.  The patient experienced  chest tightness, shortness of breath, and fatigue with walking, although,  the patient was able to walk  a total of 9 minutes 30 seconds.  It took him  to 8 minute and 30 seconds before achieving target heart rate.  He also had  some subtle T wave depression in V2 and V3 with exertion that resolved  within 3 minutes of recovery.  The perfusion images showed no ischemia, EF  53%, and the patient was deemed appropriate for discharge home with  recommendations to follow up with his PCP regarding problems related to  fatigue and depression.  The patient states he was recently diagnosed with  bipolar disorder and his family doctor is managing.   DISCHARGE DIAGNOSES:  1.  Atypical chest pain, noncardiac in etiology with nonischemic Cardiolite      study.  2.  Dyslipidemia.  3.  Diabetes.  4.  Gastroesophageal reflux disease.  5.  New diagnosis of bipolar disorder.   DISCHARGE MEDICATIONS:  1.  Glucophage 500 mg two daily.  2.  Glucotrol 10 twice daily.  3.  Advicor 500 mg daily.  4.  Altace 2.5 mg daily.  5.  Zoloft 100 mg two daily.  6.  Aspirin 325 mg daily.  7.  IMDUR 60 mg  daily.  8.  Lantus Insulin 10 units at bedtime.   ACTIVITY:  As previous.   DIET:  Heart healthy.   FOLLOW UP:  Keep previous appointments with Dr. Tamala Julian.  Call family doctor  for appointment regarding fatigue and depression.      ALE/MEDQ  D:  04/05/2005  T:  04/05/2005  Job:  [id number]

## 2011-04-23 NOTE — Assessment & Plan Note (Signed)
[facility] OFFICE NOTE   NAME:[name] [name]                    MRN:          [id number]  DATE:02/07/2007                            DOB:          28-Jul-1946    [name] [name] is seen for follow up. He has not had any recurrent  syncopal spells. He does mention if he overworks in certain situations  he has some burning sensation in his chest. This is short in duration.  We have to be sure that this is not angina. It does not sound unstable  at this time.   PAST MEDICAL HISTORY:   ALLERGIES:  MORPHINE.   MEDICATIONS:  Lantus, Altace, Imdur, Advicor, aspirin, metformin,  Amaryl, Zyprexa, sertraline.   OTHER MEDICAL PROBLEMS:  See the list below.   REVIEW OF SYSTEMS:  Otherwise he feels fine and his review of systems is  negative.   PHYSICAL EXAMINATION:  Blood pressure is 112/72, with a pulse of 63. The  patient is oriented to person, time, and place. Affect is normal.  HEENT: Reveals no xanthelasma. He has normal extraocular motion. There  are carotid bruits. There is no jugular venous distension.  CARDIAC EXAM; reveals and S1, with an S2. There are no clicks or  significant murmurs.  LUNGS: Clear. Respiratory effort is not labored.  ABDOMEN: Reveals normal bowel sounds.  He has no peripheral edema.   EKG: Reveals his old right bundle branch block.   PROBLEMS:  Include;  1. Obstructive sleep apnea with surgery in the past.  2. Depression.  3. Hyperlipidemia.  4. Diabetes.  5. Coronary disease. His last catheterization was in March of 2007.      Because he is having some burning in his chest we will proceed with      a stress Myoview scan and be in touch with him with the result.  6. Good left ventricular function.  7. Some sensation of increased heart rate. We had tried him on low      dose Toprol. He is not on it at this time and I will re discuss      this with him.  8. History of kidney  stones.  9. History of possible restless leg.  10.Old right bundle branch block.  11.PACs.   With some recent chest burning we will proceed with an exercise test.     [name] Bjornstad, MD, [facility]  Electronically Signed    JDK/MedQ  DD: 02/07/2007  DT: 02/07/2007  Job #: [id number]   cc:   [name] [name]. [name] Orleans, DO

## 2011-04-23 NOTE — Cardiovascular Report (Signed)
[name] [name], [name] [name]             ACCOUNT NO.:  [id number]   MEDICAL RECORD NO.:  [id number]          PATIENT TYPE:  INP   LOCATION:  2019                         FACILITY:  [facility]   PHYSICIAN:  Minus Breeding, [name]   DATE OF BIRTH:  [birth year]   DATE OF PROCEDURE:  03/02/2006  DATE OF DISCHARGE:                              CARDIAC CATHETERIZATION   PRIMARY CARE PHYSICIAN:  Drema Pry, D.O. [facility]   PROCEDURE:  Left heart catheterization/coronary arteriography.   DESCRIPTION OF PROCEDURE:  Left heart catheterization was performed via the  right femoral artery.  The artery was cannulated using anterior wall  puncture.  A #6 French arterial sheath was inserted via the modified  Seldinger technique.  A preformed Judkins and pigtail catheter were  utilized.  The patient tolerated the procedure well and left the lab in  stable condition.   RESULTS:  HEMODYNAMICS:  LV 109/16, AO 108/80.   CORONARIES:  The left main was normal.  The LAD had a proximal long 50%  stenosis.  The mid and distal vessel was severely diffusely diseased.  There  was a stent in the midsegment that was patent with diffuse in-stent  restenosis, moderate.  There was a proximal diagonal that was large and had  proximal 50% stenosis.  There was backfilling of a vein graft via this  diagonal.  The second diagonal which was occluded at a previous stent site.  It was seen to fill via vein graft.  The circumflex in the AV groove had 25%  stenosis proximally.  There was 25% stenosis after the first posterolateral.  The ramus intermedius was small with proximal 25% stenosis.  The first  obtuse marginal was small and normal.  There were two large posterolaterals  which were normal.  The right coronary artery was dominant.  There was a  large acute marginal branch.  After this, the vessel was occluded.  The PDA  was seen to fill via vein grafts and was moderate-size and normal.   GRAFTS:  The LIMA to the LAD was widely patent.   The saphenous vein graft to  the first diagonal was widely patent, saphenous vein graft to the second  diagonal was patent.  Saphenous vein graft to the right coronary artery was  widely patent.   LEFT VENTRICULOGRAM:  The left ventriculogram was obtained in the RAO  projection.  EF was 55% with mild inferior hypokinesis.   CONCLUSION:  Three-vessel coronary artery disease with patent grafts and  well-preserved ejection fraction.   PLAN:  The patient will continue to have aggressive medical management.           ______________________________  Minus Breeding, [name]     JH/MEDQ  D:  03/02/2006  T:  03/04/2006  Job:  [id number]   cc:   Drema Pry, D.O. [place]  [address] [name], [place] [address]

## 2011-04-23 NOTE — Discharge Summary (Signed)
[place]  Patient:    [name] [name], [name] [name]                    MRN: [id number] Adm. Date:  [id number] Disc. Date: [id number] Attending:  Delsa Bern                           Discharge Summary  ADMISSION DIAGNOSES: 1. Obstructive sleep apnea. 2. Nasoseptal deviation with obstruction. 3. Uvulopalatal and tonsillary hypertrophy. 4. Coronary artery disease with history of coronary artery bypass grafting. 5. Non-insulin-dependent diabetes.  DISCHARGE DIAGNOSES: 1. Obstructive sleep apnea. 2. Nasoseptal deviation with obstruction. 3. Uvulopalatal and tonsillary hypertrophy. 4. Coronary artery disease with history of coronary artery bypass grafting. 5. Non-insulin-dependent diabetes.  SURGICAL PROCEDURES THIS ADMISSION: 1. Uvulopalatopharyngoplasty. 2. Nasal septoplasty. 3. Tonsillectomy. 4. Inferior turbinate intramural cautery.  ANESTHESIA FOR THE ABOVE SURGERY:  General endotracheal.  DATE OF SURGERY:  01/12/01  DISPOSITION:  The patient is discharged to home in stable condition in the company of his family.  DISCHARGE MEDICATIONS:  1. Glucotrol XL 10 mg p.o. b.i.d.  2. Avandia 4 mg p.o. b.i.d.  3. Lipitor 5 mg p.o. q.d.  4. Toprol XL 50 mg p.o. q.a.m.  5. Zoloft 100 mg p.o. q.p.m.  6. Prilosec 20 mg p.o. q.a.m.  7. Wellbutrin SR 150 mg p.o. q.p.m.  8. Celebrex 200 mg p.o. q.d.  9. Nitroglycerin patch applied to the chest wall per day. 10. Imdur 30 mg p.o. q.d. 11. Lortab elixir two to three teaspoons every four to six hours as needed for     pain control.  Dispense 300 cc without refills. 12. Augmentin elixir 400 mg/5 cc one teaspoon b.i.d. for 10 days.  ACTIVITY:  Limited.  No lifting or straining.  No nose blowing or sneezing.  DIET:  Clear liquids and soft diet as tolerated.  WOUND CARE:  Saline nasal spray four sprays in each nostril every hour while awake.  FOLLOW-UP:  The patient will follow up in my office on January 19, 2001, for further care or sooner if warranted by worsening symptoms.  BRIEF HISTORY:  Mr. Maye is a 65 year old white male who was referred for evaluation of obstructive sleep apnea.  He has a significant history of daytime fatigue and hypersomnolence.  An inpatient sleep study was performed, which showed moderately severe obstructive sleep apnea with mild O2 desaturation.  Given the patients symptoms, significant history of coronary artery disease with previous bypass grafting, and inability to tolerate the use of CPAP, we recommended that we undertake the above surgical procedures. Prior to admission to the hospital, the risks, benefits, and possible complications of the above surgical procedures were discussed in detail with the patient and his family.  They understood and concurred with our plan for surgery.  HOSPITAL COURSE:  The patient was brought to the operating room on January 12, 2001, and under general anesthesia uvulopalatopharyngoplasty, nasal septoplasty, tonsillectomy, and inferior turbinate cautery were performed. The patient was then transferred from the operating room to recovery and then admitted to hospital for postoperative observation on the ENT service under Dr. Shanon Brow L. [facility] care.  The patient was transferred from recovery to unit 2100 for postoperative evaluation and follow-up.  He was monitored and continued his pulse oximetry and cardiac monitoring in the postoperative period.  On room air, the patients oxygen saturations were greater than 94% throughout the first postoperative night and he was tolerating  a soft oral diet without difficulty, as well as liquids and medications.  The patients cardiac rhythm and blood pressure and heart rate were stable throughout the postoperative period.  Examination revealed a patent nasal cavity with bilateral nasal septal splints, status post uvulopalatopharyngoplasty without bleeding or discharge.  The patient  was then discharged to home on the first postoperative morning.  He was ambulating without difficulty.  Normal bowel and bladder function and no complications during the hospitalization.  He was discharged home in the company of his wife with the above discharge instructions.  He will follow up in my office in one week for further postoperative care or sooner if needed.  Mr. and Mrs. Grandinetti are comfortable with this discharge planning and will follow up as above. DD:  01/13/01 TD:  01/15/01 Job: [id number]

## 2011-04-23 NOTE — Discharge Summary (Signed)
[name] [name], [name] [name]             ACCOUNT NO.:  [id number]   MEDICAL RECORD NO.:  [id number]          PATIENT TYPE:  INP   LOCATION:  2019                         FACILITY:  [facility]   PHYSICIAN:  Dola Argyle, [name]     DATE OF BIRTH:  12/26/1945   DATE OF ADMISSION:  03/01/2006  DATE OF DISCHARGE:  03/03/2006                                 DISCHARGE SUMMARY   PRINCIPAL DIAGNOSIS:  Unstable angina pectoris.  1  Normal serial cardiac markers.  1.  Patent bypass grafts/ preserved left ventricular function by cardiac      catheterization.   SECONDARY DIAGNOSES:  1.  Coronary artery disease.      1.  Four vessel coronary artery bypass grafting in 2000.      2.  Status post stenting left anterior descending in 1999.  2.  Hypertension.  3.  Hyperlipidemia.  4.  Insulin-dependent diabetes mellitus.  5.  Obstructive sleep apnea.  6.  Anxiety/depression.   PROCEDURE:  Coronary angiography March 02, 2006:  Widely patent grafts/EF  55% with mild inferior hypokinesis.   REASON FOR ADMISSION:  Mr. Iaquinto is a 65 year old male, with known  coronary artery disease status post prior CABG and prior stenting of the LAD  in 1999, with most recent cardiac catheterization September of 2006,  revealing widely patent grafts with native LAD and PDA disease.  Patient  presented to the emergency room with complaints of chest pain and was  admitted for further evaluation and management. Symptoms worrisome for  unstable angina pectoris.   HOSPITAL COURSE:  Patient ruled out for myocardial infarction with all  serial cardiac markers within normal limits.   Cardiac catheterization was performed the following day by Dr. Minus Breeding (see report for full details), revealing patent LIMA-LAD, SVG-first  diagonal, SVG-second diagonal, and SVG-RCA grafts.  LV function was  preserved (EF 55%).   Dr. Percival Spanish recommended continued medical management.  No new medication  adjustments were made.  Patient  was kept for overnight observation, and  cleared for discharge the following morning in hemodynamically stable  condition.  There were no noted complications of the right groin incision  site.   DISCHARGE LABORATORY DATA:  Normal CBC.  Sodium 137, potassium 3.6, glucose  184, BUN 11, creatinine 1.  Normal cardiac markers.  Lipid profile:  Total  cholesterol 113, triglycerides 67, HDL 37, and LDL 63.  TSH 4.9.   Admission chest x-ray:  NAD.   DISCHARGE MEDICATIONS:  1.  Metformin 1000 mg b.i.d.  2.  Glipizide 10 mg b.i.d.  3.  Lantus 40 units nightly.  4.  Zoloft 100 mg daily.  5.  Imdur 60 mg daily.  6.  Altace 2.5 mg daily.  7.  Aspirin 325 mg daily.  8.  Advicor 500/20 mg daily.  9.  Alprazolam 0.5 mg nightly.   DISCHARGE INSTRUCTIONS:  Patient instructed to resume metformin in two days.   FOLLOW UP:  Dola Argyle, [name], Tuesday, March 22, 2006, at 11:45 a.m.   DISPOSITION:  Stable.   DURATION OF DISCHARGE ENCOUNTER:  Less than 30 minutes.  Gene Serpe, P.A. [facility]    ______________________________  Dola Argyle, [name]    GS/MEDQ  D:  03/03/2006  T:  03/04/2006  Job:  [id number]

## 2011-04-23 NOTE — Discharge Summary (Signed)
[facility]  Patient:    [name] [name], COPPES                    MRN: [id number] Adm. Date:  [id number] Disc. Date: [id number] Attending:  Delsa Bern Dictator:   Julianne Handler, N.P. CC:         [name], [name]   Discharge Summary  DATE OF BIRTH:  1946/08/08  PRIMARY CARE [facility]:  [name], [name]  [facility] COURSE: #1 - UNSTABLE ANGINA PECTORIS:  Mr. Droge is a 65 year old, history of CABG x 4 by Dr. Tharon Aquas Trigt October 2000 who is status post Cardiolite in clinic July 26, 2000 for anginal symptoms; Cardiolite suspicious for anterolateral ischemia, old inferior MI with lateral peri-infarct.  He was scheduled to have undergone elective cardiac catheterization but developed two episodes of unstable angina pectoris relieved with sublingual nitrate.  When he returned home from work, he and his wife decided to come to the emergency room.  He was seen by Dr. Darlin Coco in the emergency room and was admitted to telemetry.  He did rule out for myocardial infarction by negative serial cardiac enzymes.  He remained pain-free on subcu Lovenox, aspirin, IV nitrates, and beta blocker.  On August 08, 2000, he underwent coronary angiography with the following results.  A. LV gram: Normal. B. Coronary angiography:    a. Left main okay.    b. LAD: 60% ostial lesion, 90% distal in stent stenosis, 90% diagonal #1,       patent diagonal #2, 90% in stent diagonal #3.    c. CFX: 70% (small) OM1.  40% OM3 and OM4.    d. RCA: 50% mid. C. Bypass angiography:    a. SVG to PDA: Patent.    b. SVG to diagonal #3: 50% ostial.    c. SVG to diagonal #2: Patent.    d. LIMA to LAD: Patent.  Patient was felt to have patent SVG and LIMA.  There was progression of native vessel disease with 70% OM1 (small) and 80% diagonal #1.  Patient had evidence of diffuse coronary artery spasm in LAD on first angiography shot  despite IV nitrates.  Plan was to continue medical therapy with increasing of nitrates. Next day patient did well and was discharged home in stable condition on augmented nitrate regimen.  #2 - DIABETES MELLITUS:  Avandia and Glucotrol were on hold pending cath.  His CBGs were within acceptable range.  #3 - HISTORY OF DEPRESSION:  Stable on Zoloft.  #4 - RIGHT ANKLE DISCOMFORT SINCE CORONARY ARTERY BYPASS GRAFT:  He was being treated on Vioxx but this will be changed to Celebrex at time of discharge.  PLAN: 1. Patient is discharged home in stable condition. 2. Discharge medications:    A. (NEW) Nitroglycerin patch 0.4 mg/hr on in the morning/off at bedtime.    B. (NEW) Celebrex 200 mg p.o. q.d.    C. Glucotrol XL 10 mg p.o. b.i.d.    D. Avandia 4 mg p.o. b.i.d.    E. Lipitor 10 mg one-half tab p.o. q.d.    F. Toprol XL 50 mg p.o. q.d.    G. Zoloft 100 mg p.o. q.d.    H. Prilosec 20 mg p.o. q.d.    I. Enteric-coated aspirin 325 mg once daily.    J. (NEW) Nitrolingual spray 0.4 one spray on or under tongue q.1m. p.r.n.       chest pain  up to three sprays in 15 minutes. 3. Activity: No heavy lifting or pushing or driving day of discharge, then as    before. 4. Diet: Low fat, low cholesterol. 5. Wound care: May shower. 6. Special instructions:    A. Stop Vioxx.    B. Call our clinic if develop large amount of swelling or bruising in groin       area. 7. Followup: Our clinic will call with followup office visit to be seen by    Dr. Tamala Julian in approximately two weeks.  LABORATORY TESTS AND DATA:  Sodium 144, potassium 3.9, chloride 104, glucose of 146, BUN of 19, creatinine 1.3.  Serial cardiac enzymes were negative x 3 sets.  Admission coagulopathies were within normal limits.  CBC: WBC of 6.6, hematocrit of 35.9, hemoglobin of 12.8, platelets of 173.  Admission chest x-ray revealed post coronary artery bypass graft, no active disease.  EKG revealed NSR with right bundle branch  block, no acute changes. DD:  01/23/01 TD:  01/24/01 Job: [id number]

## 2011-04-23 NOTE — H&P (Signed)
NAMENADIA, PIZZOFERRATO             ACCOUNT NO.:  [id number]   MEDICAL RECORD NO.:  [id number]          PATIENT TYPE:  OBV   LOCATION:  [id number]                         FACILITY:  [place]   PHYSICIAN:  Broadus John, MD DATE OF BIRTH:  1946-12-05   DATE OF ADMISSION:  01/18/2005  DATE OF DISCHARGE:                                HISTORY & PHYSICAL   HISTORY OF PRESENT ILLNESS:  [name] [name] is a 65 year old white male who  is admitted to [facility] for further evaluation of chest  pain. The patient has a history of coronary artery disease which dates back  to 1999. At that time, he underwent stent deployment in the LAD and  diagonal. In 2000, he underwent coronary artery bypass surgery. He received  a left internal mammary artery graft to the LAD, saphenous vein graft to the  first diagonal, a saphenous vein graft to the second diagonal, and a  saphenous vein graft to the right coronary artery. His last cardiac  catheterization performed because of recurrent chest pain was in June of  2005. His left ventricular function was normal. All grafts were patent.  There was a 30% proximal lesion in the saphenous vein graft to the second  diagonal. This was not different from his cardiac catheterization in 2003.  It was felt that his chest pain was not cardiac in origin.   The patient returned to the emergency room after experiencing chest pain for  much of the day today. 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 The chest pain was described as a crampy and sharp  discomfort in the left anterior upper chest. It radiated to the left arm. It  was associated with dyspnea, diaphoresis, and nausea. It lasted throughout  much of the day. He took one nitroglycerin which had minimal effect on his  chest pain. He ultimately presented to the emergency department. He is free  of chest pain at this time and is otherwise asymptomatic. There were no  exacerbating  or ameliorating factors. The chest pain appeared not to be  related to position, activity, meals, or respirations. He believes the chest  pain was similar to his prior chest pain.   There is no history of congestive heart failure or arrhythmia. The patient  has a history of insulin-dependent diabetes mellitus and dyslipidemia. There  is no history of hypertension. There is a strong family history of early  coronary artery disease (both father and mother). The patient does not  smoke.   MEDICATIONS:  The patient is on a number of medications for the  aforementioned medical problems. These include insulin, Glucophage,  Glucotrol, Altace, Zoloft, Wellbutrin, Prevacid, and aspirin.   ALLERGIES:  None.   PAST SURGICAL HISTORY:  1.  Uvulopalatopharyngoplasty.  2.  Nasal septoplasty.  3.  Tonsillectomy for the treatment of obstructive sleep apnea.  4.  Coronary artery bypass surgery.   SOCIAL HISTORY:  The patient works as a Human resources officer. He is married and lives with his wife. He has four  children.  FAMILY HISTORY:  His mother died at age 46 of coronary artery disease. His  father suffered a myocardial infarction at age 24 and ultimately died at age  67 due to ventricular arrhythmia. He has a brother with coronary artery  disease. Another brother has diabetes. Another brother is alive and well.   REVIEW OF SYMPTOMS:  Reveals no new problems related to his head, eyes,  ears, nose, mouth, throat, lungs, gastrointestinal system, genitourinary  system, or extremities. There is no history of neurologic or psychiatric  disorder. There is no history of fever, chills, or weight loss.   PHYSICAL EXAMINATION:  VITAL SIGNS:  Blood pressure 120/67, pulse 60 and  regular, respirations 16, temperature 97.5.  GENERAL:  The patient was a middle-aged white man in no discomfort. He was  alert, oriented, appropriate, and responsive.  HEENT:  Normal.  NECK:   Without thyromegaly or adenopathy. Carotid pulses were palpable  bilaterally without bruits.  CARDIOVASCULAR:  Normal S1 and S2. There was no S3, S4, murmur, rub, or  click.  CARDIOVASCULAR:  Regular. No chest wall tenderness was noted.  LUNGS:  Clear.  ABDOMEN:  Soft and nontender. There was no mass, hepatosplenomegaly, bruit,  distention, rebound, guarding, or rigidity. Bowel sounds were normal.  RECTAL:  Not performed as they were not pertinent for the reason for acute  care hospitalization.  GENITAL:  Not performed as they were not pertinent for the reason for acute  care hospitalization.  EXTREMITIES:  Without edema, deviation, or deformity. Radial and dorsalis  pedis pulses were palpably bilaterally.  NEUROLOGICAL:  Brief screening neurologic survey was unremarkable.   LABORATORY DATA:  The electrocardiogram revealed normal sinus rhythm with  right bundle branch block. The chest radiograph revealed no evidence of  acute disease.   The initial set of cardiac markers revealed a CK-MB of less than 1.0,  myoglobin 74.6, and troponin of less than 0.05. The second set revealed a CK-  MB of less than 1.0, myoglobin 65.6, and troponin less than 0.05. The third  set revealed a CK-MB of less than 1.0, myoglobin of 64.3, troponin less than  0.05. BUN was 13, creatinine 1.0, and potassium 3.4. White count was 8.1  with a hemoglobin of 14.1 and hematocrit of 40.8. The remaining studies were  pending at the time of this dictation.   IMPRESSION:  1.  Chest pain:  Rule out unstable angina.  2.  Coronary artery disease:  Status post left anterior descending artery      and diagonal stent in 1999. Status post coronary artery bypass surgery      in 2000 with a left internal mammary artery to the left anterior      descending artery, saphenous vein graft to the first diagonal, saphenous      vein graft to the second diagonal, and saphenous vein graft to the right     coronary artery. The last  cardiac catheterization in June of 2005      demonstrated normal left ventricular function and all grafts patent.  3.  Insulin-dependent diabetes mellitus.  4.  Dyslipidemia.  5.  Depression.  6.  Obstructive sleep apnea, status post corrective surgery.   PLAN:  1.  Telemetry.  2.  Serial cardiac enzymes.  3.  Aspirin.  4.  Intravenous heparin.  5.  Intravenous nitroglycerin.  6.  Further measures per Dr. [name].      MSC/MEDQ  D:  01/18/2005  T:  01/19/2005  Job:  [id number]  cc:   Sinclair Grooms, [name]  [place] [employer]  [address]  [place] [address]  Fax: [phone]

## 2011-04-23 NOTE — Cardiovascular Report (Signed)
NAME:  [name] [name], [name] [name]                       ACCOUNT NO.:  [id number]   MEDICAL RECORD NO.:  [id number]                   PATIENT TYPE:  OIB   LOCATION:  2861                                 FACILITY:  [facility]   PHYSICIAN:  Sinclair Grooms, [name]            DATE OF BIRTH:  [birth year]   DATE OF PROCEDURE:  05/28/2004  DATE OF DISCHARGE:  05/28/2004                              CARDIAC CATHETERIZATION   INDICATIONS FOR PROCEDURE:  The patient has had vague near continuous  symptoms since bypass surgery in 2000.  He had previously undergone coronary  stenting by Dr. Quay Burow to the LAD and diagonal in 1999.  Developed  in-stent restenosis and had bypass surgery with saphenous vein graft to the  first and second diagonal.  Both were individual grafts.  Saphenous vein  graft to the distal right coronary and a LIMA to the LAD.  Since that time,  he has had three repeat cardiac catheterizations because of symptoms of  chest discomfort with documentation of wide graft patency and also patent  native circulation with the exception of total occlusion of the second  diagonal which is supplied by a graft.  His symptoms continue and he was  recently hospitalized.  No cardiac evaluation was done because symptoms were  too vague to be considered cardiac.  He has subsequently seen Dr. Shelia Media and  Dr. Radford Pax and was set up to have this coronary angiogram performed because  of his persistent symptoms despite a normal Cardiolite study in April.   PROCEDURE PERFORMED:  1. Left heart catheterization.  2. Selective coronary angiography.  3. Left ventriculography.  4. Left internal mammary graft angiography.  5. Saphenous vein graft angiography.  6. Angio-Seal arteriotomy closure.   DESCRIPTION:  After informed consent, a 6-French sheath was placed in the  right femoral artery using modified Seldinger technique.  A 6-French A2  multipurpose catheter was used for hemodynamic recordings, left  ventriculography by hand injection and selective left and right coronary  angiography as well as saphenous vein graft angiography.  We then used an  internal mammary artery catheter for left internal mammary artery  angiography.  The patient tolerated the procedure without complications.  Intracoronary nitroglycerin was administered into the left main coronary and  also into the right coronary.  Each receiving 200 mcg of intracoronary  nitroglycerin.   Arteriotomy closure was performed after angiography of the right femoral.  Good hemostasis was achieved.   RESULTS:   I. HEMODYNAMIC DATA:  A.  Aortic pressure 135/72.  B.  Left ventricular pressure 135/11.   II. LEFT VENTRICULOGRAPHY:  The left ventricular cavity size and systolic  function are normal.  EF is 55%.   III. CORONARY ANGIOGRAPHY:  A.  Left main coronary:  Normal.  B.  Left anterior descending coronary:  The LAD is patent.  There is  competitive flow in the distal LAD from the patent LIMA.  There is  also  retrograde filling in the saphenous vein graft to the first diagonal.  The  second diagonal is totally occluded at the site of the previous stent.  C.  Circumflex artery:  Circumflex artery is large.  It gives origin to two  large inferior wall and lateral wall obtuse marginals.  No significant  obstruction is noted in the circumflex.  D.  Right coronary:  The right coronary is patent.  There is mid 40-50%  narrowing.  An RV branch wraps around the right ventricular free wall and  supplies the mid and distal inferior interventricular groove.  The  continuation of the right coronary beyond the origin of this marginal branch  is totally occluded, but is supplied by a saphenous vein graft.   IV. SAPHENOUS VEIN GRAFT ANGIOGRAPHY:  A.  Saphenous vein graft to the first  diagonal:  Widely patent.  B.  Saphenous vein graft to the totally occluded second diagonal:  This  graft is widely patent.  There is mild proximal/ostial  30% narrowing.  C.  Saphenous vein graft to the distal right coronary:  This graft is widely  patent.  There is perhaps 20% narrowing at the distal anastomosis site.  D.  Left internal mammary graft to the distal LAD:  Widely patent with  reflux of contrast throughout the entire left coronary system.   CONCLUSION:  1. Widely patent saphenous vein and left internal mammary grafts.  2. Widely patent circumflex.  There is 40-50% narrowing in the mid right     coronary proximal to the marginal branch that supplies the mid and distal     inferior interventricular groove.  The distal right coronary artery is     totally occluded, but its territory is supplied by saphenous vein graft     to the distal right.  There is 40% narrowing in the proximal and mid LAD,     but the LAD is still widely patent throughout the native circulation.  3. Normal left ventricular function.  4. When compared to the angiogram from two years ago, no significant changes     occurred.   RECOMMENDATIONS:  The patient's symptoms at this point appear to obviously  not be related to significant obstructive coronary disease.  I doubt that  coronary artery spasm has any relationship to the patient's symptoms.  I am  of the thought process that his chest symptoms may be psychosomatic or that  there may be a primary pulmonary process going on.  Before assuming that the  patient's symptoms are psychosomatic I would consider having a pulmonary  evaluation and also consideration of connective tissue disease.                                               Sinclair Grooms, [name]    HWS/MEDQ  D:  05/28/2004  T:  05/29/2004  Job:  [id number]   cc:   [name], [name]  [place]  [place] [address]  Fax: [phone]   Fransico Him, [name]  [address] [place]  [place], [place] [address]  Fax: [phone]

## 2011-04-23 NOTE — Discharge Summary (Signed)
NAME:  [name] [name], [name] [name]                       ACCOUNT NO.:  [id number]   MEDICAL RECORD NO.:  [id number]                   PATIENT TYPE:  NP   LOCATION:  [id number]                                 FACILITY:  [facility]   PHYSICIAN:  [name], [name]                DATE OF BIRTH:  [birth year]   DATE OF ADMISSION:  05/31/2002  DATE OF DISCHARGE:  06/04/2002                                 DISCHARGE SUMMARY   PRIMARY CARE [facility]:  [name], [name]   PROCEDURE:  1. Adenosine Cardiolite revealing no evidence of ischemia but an inferior     defect more pronounced on the stress images, question diaphragmatic     versus ischemia.  2. On June 04, 2002, a cardiac catheterization revealing patent  bypass     grafts without change in the native coronary anatomy.  No setup for     inferior ischemia.  Normal LV function.   DISCHARGE DIAGNOSES:  1. Atypical left shoulder discomfort and pleuritic chest pain in a 65-year-     old diabetic male, with known coronary artery disease (prior coronary     artery bypass grafting three years earlier), with a follow-up cardiac     catheterization revealing patent grafts with some progression of the     native coronary artery disease.  He had refractory symptoms over the past     year, and on admission.     a. He ruled out for a myocardial infarction with negative serial cardiac        enzymes.  An electrocardiogram revealed no change from the electrocardiogram of  September 2001.    b.  A discharge Cardiolite on May 31, 2002, revealed no ischemia but an  inferior defect all throughout the stress images, question diaphragmatic  versus ischemia.    c.  A coronary angiography and bypass angiography:  Left ventriculogram  revealed normal, and coronary angiography revealed normal, the left anterior  descending coronary artery had a 90% in-stent restenosis which was proximal  to the left internal mammary artery graft insertion site.  The diagonal II  with  100% in-stent.  The left anterior descending coronary artery with  competitive flow, secondary to left internal mammary artery and two diagonal  grafts.  The circumflex was normal.  The right coronary artery had luminal  irregularities.  d.  Bypass angiography:  Saphenous venous graft to diagonal I patent.  The  saphenous vein graft to diagonal II had a 30% proximal.  The saphenous vein  graft to the posterior descending coronary artery was patent.  The left  internal mammary artery to the left anterior descending coronary artery was  patent.  It was the impression of Dr. Tamala Julian that the patient had patent  grafts.  No change in the native coronary anatomy compared to prior cardiac  catheterization in September 2001.  It was felt that his chest and shoulder  pain  was noncardiac in etiology.  1. Diabetes mellitus:  CBGs were within acceptable range during the course     of his admission.   PLAN:  The patient is discharged home in stable condition.   DISCHARGE MEDICATIONS:  1. Glucotrol XL 10 mg p.o. b.i.d.  2. Glucophage 500 mg p.o. every evening.  Do not restart until the morning     of June 07, 2002.  3. Avandia 4 mg p.o. b.i.d.  4. Fish oil capsule once q.d. as before.  5. Imdur 60 mg p.o. q.d.  6. Nitroglycerin tab 0.4 mg sublingual p.r.n. chest pain x2 tab q.15     minutes.  7. Celebrex 200 mg q.d. or Dextra 10 mg q.d.  8. Prilosec 20 mg q.d.  9. Enteric-coated aspirin 325 mg q.d.  10.      Altace 2.5 mg p.o. q.d.  11.      Advicor 500/20 mg p.o. q.h.s.  12.      Zoloft 100 mg p.o. q.d.  13.      Wellbutrin 150 mg b.i.d.  14.      Clarinex 5 mg p.o. q.d.   DIET:  Low-fat, low-cholesterol diabetic diet.   FOLLOW UP:  Dr. Daneen Schick as previously scheduled.   HISTORY OF PRESENT ILLNESS:  The patient is a very pleasant 65 year old  gentleman with a history of diabetes mellitus, dyslipidemia, and known  history of coronary artery disease, status post CABG three years earlier  and  follow-up cardiac catheterization in September 2001, revealing patent grafts  but some question of native coronary artery disease.  At the time of that  catheterization the patient did reveal an element of coronary artery spasm.  The patient presented with a three-day history of malaise/fatigue.  Emesis  two days prior to admission.  He had bilateral throat discomfort radiating  to the left jaw.  It seemed to be exacerbated with activities.  Noted more  dyspneic on exertion.  On the day of admission he had left shoulder sharp  constant pain which waxed and waned in intensity.  He took two SL nitrates  and an additional two SL en route to the emergency room with improvement.  It seemed his epigastric discomfort was exacerbated and precipitated by deep  inspiration.   PAST MEDICAL HISTORY:  1. Coronary atherosclerotic heart disease     a. A CABG by Dr. Tharon Aquas Trigt.     b. In September 2001, a cardiac catheterization revealing native vessel        60% ostial LAD, 90% distal in-stent LAD restenosis.  The diagonal I        had 90%, diagonal II patent, diagonal III 90% in-stent restenosis.        Native circumflex was small.  The OM I had 70% stenosis.  OM III and        OM IV had 40%.  It was noted that he had diffuse coronary artery        spasm.  The RCA 50%, new.  Bypass grafts:  LIMA to the LAD patent.        The SVG to PDA patent.  The SVG to diagonal III had a 50% ostial.  The        SVG to diagonal II patent.  2. Diabetes mellitus for 13 years.  3. Dyslipidemia, on Lipitor in the past causing a decrease in HDL.     Currently on fish oil and Advicor.  4. Depression, under good control on  Wellbutrin and Zoloft.  5. Right ankle discomfort since bypass surgery, for which he is being     treated with Celebrex.  6. History of obstructive sleep apnea for which he has had     Uvulopalatopharyngoplasty, nasal septoplasty, and a tonsillectomy. 7. Right bundle branch block.   PAST  SURGICAL HISTORY:  1. Uvulopalatopharyngoplasty, nasal septoplasty, and tonsillectomy for     treatment of obstructive sleep apnea.  2. In October 2000, a CABG.   LABORATORY DATA:  WBC 5.6, hemoglobin 13.4, hematocrit 38.2, platelets 155.  PT, INR within normal range.  Sodium 137, K 2.9, chloride 108, CO2 of 22,  glucose 163, BUN 18, creatinine 1.1.  Liver function tests all within normal  limits.  Serial cardiac enzymes including CK, MB, and troponin I were all  within normal range.   His chest x-ray revealed changes consistent with a prior CABG.  No active  disease.   Electrocardiogram revealed NSR, a right bundle branch block, with T-wave  inversion in V1 through V3, without significant change from the  electrocardiogram in September 2001.   The discharge time was greater than 40 minutes, including dictating this  discharge summary and arranging followup office visits, and reviewing  discharge instructions with the patient.        MARY SERPE, P.A.                          [name], [name]    MS/MEDQ  D:  07/24/2002  T:  07/26/2002  Job:  [id number]   cc:   [name], [name]  Fax: [phone]

## 2011-04-23 NOTE — Assessment & Plan Note (Signed)
[place] OFFICE NOTE   NAME:[name] [name], [name] [name]                    MRN:          [id number]  DATE:11/25/2006                            DOB:          [birth year]    Mr. Groot is seen for cardiology followup. I had seen him last in May  2007. This summer in the heat up in [place], he did have a syncopal  spell. He was observed and assessed at the time and was stable. He was  done well since then. We had done a repeat catheterization in March 2007  showing his grafts were patent and his LV was good.   He is going about full activities. He has some rare cramping in his left  upper chest. His original angina felt something like this, but at this  point we do not think this is his angina. He has not had any recurrent  dizziness or syncope.   PAST MEDICAL HISTORY:   ALLERGIES:  MORPHINE.   MEDICATIONS:  1. Lantus.  2. Altace.  3. Imdur.  4. Avecor.  5. Aspirin.  6. Metformin.  7. Amaryl.  8. Zyprexa.  9. Sertraline.   OTHER MEDICAL PROBLEMS:  See the complete list on my note of 03/22/06.   REVIEW OF SYSTEMS:  He is doing well other than the history of present  illness.   PHYSICAL EXAMINATION:  VITAL SIGNS: Blood pressure was 132/70. His rate  is 73.  GENERAL: The patient is oriented to person, time, and place and his  affect is normal.  There is no xanthelasma. There is normal extraocular motion.  There are no carotid bruits. There is no jugular venous distension.  CARDIAC: Reveals an S1 with an S2. There are no clicks or significant  murmurs.  LUNGS: Clear. Respiratory effort is not labored.  ABDOMEN: Soft. There are no masses or bruits.   EKG:  Reveals his old right bundle branch block. There is normal sinus  rhythm.   PROBLEMS:  Problems are listed on my note of 03/22/2006  1. Coronary disease stable.   The fact that he had a syncopal episode this past summer, of course was  concerning, but  there were circumstances for that. He is stable since  then. I am not inclined to do any other testing at this time, other than  checking a fasting lipid profile. However, I do plan to see him back in  approximately 9 weeks to re-discuss his situation and to see if any  further studies are needed.     Carlena Bjornstad, MD, [facility]  Electronically Signed    JDK/MedQ  DD: 11/25/2006  DT: 11/26/2006  Job #: [id number]   cc:   Sandy Salaam. Shawna Orleans, DO

## 2011-04-23 NOTE — Op Note (Signed)
[place]. [facility]  Patient:    [name] [name]                     MRN: [id number] Proc. Date: 09/21/99 Adm. Date:  [id number] Attending:  [name]. Iii CC:         Alla German, [name]             CVTS office                           Operative Report  PREOPERATIVE DIAGNOSIS:  Class 3 recurrent progressive angina with severe two-vessel coronary disease, status post percutaneous transluminal coronary angioplasty stenting of the left anterior descending and diagonal 2.  POSTOPERATIVE DIAGNOSIS:  Class 3 recurrent progressive angina with severe two-vessel coronary disease, status post percutaneous transluminal coronary angioplasty stenting of the left anterior descending and diagonal 2.  OPERATION PERFORMED:  Coronary artery bypass grafting x 4 (left internal mammary artery to the left anterior descending, saphenous vein graft to anterolateral, saphenous vein graft to second diagonal, saphenous vein graft to posterior descending branch of the right coronary artery).  SURGEON:  Len Childs, [name]  ASSISTANT:  Ricki Miller, P.A.  ANESTHESIA:  General.  ANESTHESIOLOGIST:  Arnette Schaumann. Lutricia Feil, [name]  INDICATIONS FOR PROCEDURE:  The patient is a 65 year old non-insulin-dependent diabetic with known coronary disease who presents with recurrent chest pain after having been treated with PTCA and stenting of the LAD diagonal in late 1999. He had had recurrent chest pain and a study performed by Dr. Tami Ribas in early October of this year demonstrated stenosis of the coronaries in the area of the previously placed stents.  He also had subtotal stenosis of the right coronary of 50 to 60%. He was referred for evaluation for coronary artery bypass grafting.  The patient was examined in the office and the results of the cardiac catheterization and coronary arteriograms were reviewed with the patient and his wife.  The indications and  expected benefits of coronary artery bypass grafting  were discussed.  I discussed the details of the operation, including the placement of surgical incisions, the use of mammary artery and saphenous vein as conduits, use of cardiopulmonary bypass and the expected postoperative recovery period.  reviewed the potential risks of the operation with the patient and wife including the risk of MI, CVA, bleeding, infection, and death.  He understood the implications for surgery and agreed to proceed with the operation as planned under informed consent.  DESCRIPTION OF PROCEDURE:  The patient was brought to the operating room and placed supine on the operating room table where general anesthesia was induced under invasive hemodynamic monitoring.  The chest, abdomen and legs were prepped with  Betadine and draped as a sterile field.  A median sternotomy was performed as the saphenous vein was harvested from the right lower extremity.  The internal mammary artery was harvested as a pedicle graft from its origin at the subclavian vessels and was a good vessel with excellent blood flow. Heparin was administered systemically and sternal retractor was placed.  Through pursestrings placed in he ascending aorta and right atrium, the patient was cannulated and placed on cardiopulmonary bypass and cooled to 32 degrees.  Prior to going on bypass, the ACT was documented as being therapeutic.  The heart was inspected and found to have no evidence of myocardial scarring or injury.  The LAD, first and second diagonals, and  distal right and posterior descending were adequate targets for grafting. he LAD had proximal disease in the area of the stent and in the proximal vessel to the stent.  The second diagonal had disease proximally and in the area of the stent and the first diagonal also had vessel wall thickening and sclerosis on palpation of the proximal vessel and was felt to be a graftable  vessel.  The mammary artery nd saphenous vein grafts were prepared for the distal anastomoses and a cardioplegia cannula was placed.  The patient was cooled to 28 degrees and as the aortic crossclamp was applied, 500 cc of cold blood cardioplegia was delivered to the aortic root with immediate cardioplegic arrest and septal temperature dropping ess than 12 degrees.  Topical iced saline slush was used to augment myocardial preservation and the pericardial insulator pad was used to protect the the left  phrenic nerve.  The distal coronary anastomoses were then performed.  The first distal anastomosis was to the distal right and the posterior descending coronary artery.  This was a 1.5 mm vessel with proximal 50 to 60% stenosis.  A reversed saphenous vein was ewn end-to-side with running 7-0 Prolene and there was good flow through the graft.  The second distal anastomosis was to the first diagonal.  This was a 1.5 mm vessel with proximal 70% stenosis.  A 1.5 mm probe would pass antegrade and met resistance passing retrograde.  A reversed saphenous vein was sewn end-to-side with running 7-0 Prolene and there was good flow through the graft.  The third distal anastomosis was to the second diagonal which was a 1.5 mm vessel which was previously stented.  The proximal vessel was diseased and a 1.5 mm probe would ot pass back through the stent. A reversed saphenous vein was sewn end-to-side with running 7-0 Prolene and there was good flow through the graft.  The fourth distal anastomosis was to the distal LAD distal to the previously placed stent.  It was a 1.5 mm vessel and a 1.5 mm probe would not pass back through the previously placed LAD stent.  The left internal mammary artery pedicle was brought through an opening created in the left lateral pericardium, was brought down on the LAD and sewn end-to-side with running 8-0 Prolene.  There was excellent flow through  the anastomosis with immediate rise in septal temperature after release of the pedicle clamp on the mammary artery.  The mammary pedicle was secured to the epicardium and the aortic crossclamp was removed.   The heart was cardioverted back to a regular rhythm.  A partial occlusion clamp was placed on the ascending aorta and three proximal vein anastomoses were performed. A 4.0 mm punch was used and running 6-0 Prolene.  The partial occlusion clamp was removed and the vein grafts were perfused.  Each had excellent flow and hemostasis was documented on the proximal and distal anastomoses.  The patient was rewarmed and reperfused and temporary packing wires were applied.  When the patient reached 37 degrees he was weaned from cardiopulmonary bypass without difficulty after reinflation of the lungs.  Protamine was administered and the cannulas were removed.  The mediastinum was irrigated with warm antibiotic irrigation.  The pericardium was closed loosely.  The leg incision was irrigated and closed in a  standard fashion.  The sternum was reapproximated with interrupted steel wire and the pectoralis fascia and subcutaneous layers closed with running Vicryl.  The kin was closed with a subcuticular and sterile dressings were  applied.  Total cardiopulmonary bypass time was 110 minutes with aortic crossclamp time of 45 minutes. DD:  09/21/99 TD:  09/22/99 Job: [id number]

## 2011-04-23 NOTE — Cardiovascular Report (Signed)
[place]. [facility]  Patient:    [name] [name], [name] [name] Visit Number: [id number] MRN: [id number]          Service Type: MED Location: [id number] Attending Physician:  [name]. Iii Dictated by:   Illene Labrador, [name] Proc. Date: 06/04/02 Admit Date:  05/31/2002 Discharge Date: 06/04/2002   CC:         Cardiac Catheterization Laboratory  CVTS  Fransico Him, [name]   Cardiac Catheterization  INDICATION FOR PROCEDURE: Left arm discomfort, atypical for angina. Cardiolite study with questionable inferior wall ischemia versus diaphragm attenuation. This study is being done to rule out evidence of significant change in coronary anatomy.  PROCEDURES PERFORMED: 1. Left heart catheterization. 2. Selective coronary angiography. 3. Left ventriculography. 4. Bypass graft angiography. 5. Internal mammary artery graft angiography, left.  DESCRIPTION OF PROCEDURE: After informed consent, a 6 French sheath was inserted. A 6 French A2 multipurpose catheter was used for hemodynamic recordings, left ventriculography by hand injection, and selective saphenous vein graft angiography. A #4 6 French left and right Judkins catheter was used for left and right coronary angiography and a internal mammary 6 French catheter for left internal mammary artery angiography. The patient tolerated the procedure without complications.  RESULTS:  I: HEMODYNAMIC DATA:     a. The aortic pressure 117/70 mmHg.     b. Left ventricular pressure 116/8 mmHg.  II: LEFT VENTRICULOGRAPHY: The left ventricle demonstrated hyperdynamic LV contractility. EF greater than 65%.  III: SELECTIVE CORONARY ANGIOGRAPHY:     a. Left main coronary: Normal.     b. Left anterior descending coronary artery: A 30-40% proximal        narrowing. Competitive distal flow due to saphenous vein grafts and        LIMA to the diagonals and LAD, respectively.     c. Circumflex artery: Normal. Two large  obtuse marginal branches are        noted.     d. Right coronary artery: The native right coronary is widely patent up to        the large inferoapical RV branch. Minimal luminal irregularities are        noted. No flow is noted into the PDA. Because of the saphenous vein        graft it fills the PDA.  IV: BYPASS GRAFT ANGIOGRAPHY:     a. Saphenous vein graft to the second diagonal: There is a 60%        ostial narrowing. This graft is very small. Distal runoff is        very minimal, but the graft is patent.     b. Saphenous vein graft to the first diagonal: Widely patent. Minimal        luminal irregularities are noted in the ostium of this graft.     c. Right coronary to the PDA: This graft is widely patent. There is no        reflux noted on this study of contrast into the more proximal RCA        as on previous studies, but clearly no significant obstruction is noted        in the graft or antegrade from the graft insertion site in the        native right coronary.  V: INTERNAL MAMMARY ARTERY GRAFT TO THE LEFT ANTERIOR DESCENDING: Widely patent.  CONCLUSIONS: 1. Patent saphenous vein grafts without change in anatomy since the September  of 2001 study. 2. Native vessel coronary artery disease with high-grade in-stent re-stenosis   in the mid left anterior descending stent and also the stent to the second    diagonal. These are chronic findings. 3. Normal left ventricular function. 4. Patent left internal mammary artery to the left anterior descending. 5. Left shoulder discomfort, noncardiac. 6. Questionable inferior wall abnormalities on Cardiolite, likely represent    diaphragm attenuation.  PLAN: Medical therapy. Dictated by:   Illene Labrador, [name] Attending Physician:  [name]. Iii DD:  06/04/02 TD:  06/05/02 Job: [id number]

## 2011-04-23 NOTE — Op Note (Signed)
[place]. [facility]  Patient:    [name] [name], [name] [name]                    MRN: [id number] Proc. Date: 01/12/01 Adm. Date:  [id number] Attending:  Delsa Bern                           Operative Report  PREOPERATIVE DIAGNOSES: 1. Obstructive sleep apnea. 2. Nasal septal deviation with nasal obstruction. 3. Uvulopalatal hypertrophy. 4. Tonsillar hypertrophy. 5. Inferior turbinate hypertrophy.  POSTOPERATIVE DIAGNOSES: 1. Obstructive sleep apnea. 2. Nasal septal deviation with nasal obstruction. 3. Uvulopalatal hypertrophy. 4. Tonsillar hypertrophy. 5. Inferior turbinate hypertrophy.  INDICATIONS: 1. Obstructive sleep apnea. 2. Nasal septal deviation with nasal obstruction. 3. Uvulopalatal hypertrophy. 4. Tonsillar hypertrophy. 5. Inferior turbinate hypertrophy.  SURGICAL PROCEDURE:  This admission. 1. Uvulopalatopharyngoplasty. 2. Nasal septoplasty. 3. Tonsillectomy. 4. Bilateral inferior turbinate intramural cautery.  ANESTHESIA:  General endotracheal.  SURGEON:  Early Chars. Wilburn Cornelia, [name]  ESTIMATED BLOOD LOSS:  Less than 100 cc.  Patient was transferred from the operating room to the recovery room in stable condition.  BRIEF HISTORY:  [name] [name] is a 65 year old white male, who was referred for evaluation of obstructive sleep apnea.  Patient had an inpatient sleep study performed at [facility] sleep laboratory with an RDI of 22 events per hour. The patient had significant history of coronary artery disease, has undergone coronary artery bypass grafting, has a history of hypertension, and non-insulin-dependent diabetes; and given his ongoing cardiac condition with sleep apnea, concerns were raised regarding exacerbation of his coronary artery disease.  The patient was titrated with CPAP and home CPAP trial was performed, unfortunately, the patient was unable to tolerate CPAP on an ongoing basis and felt that CPAP was not a  reasonable for option ______ sleep apnea.  Given this history and findings on physical examination, I recommend that we undertake the above surgical procedure. The risks, benefits, and possible complications of uvulopalatopharyngoplasty, tonsillectomy, nasal septoplasty, and turbinate reduction were discussed in detail with the patient, who understood and concurred with our plan for surgery, which was scheduled as above.  Prior to scheduling surgery, preoperative clearance was obtained from Dr. Daneen Schick, the patients cardiologist.  Dr. Tamala Julian cleared [name] for general anesthesia and the above surgical procedures.  SURGICAL PROCEDURE:  The patient was brought to the operating room on January 12, 2001 at [facility] main OR, general endotracheal anesthesia was established without difficulty.  When the patient had been adequately anesthetized, he was injected with 13 cc of 1% lidocaine, 1:100,000 solution epinephrine injected in a submucosal fashion along the nasal septum and inferior turbinates bilaterally.  Afrin-soaked cottonoid pledgets were then placed into the patients nasal cavity and left in place for 10 minutes to allow for vasoconstriction.  The patient was prepped and draped in a stable fashion and surgical procedure was begun by creating a left hemitransfixion incision.  This was carried through the mucosa and underlying submucosa and a mucoperiosteal flap was elevated from anterior to posterior.  The patient was found to have a significantly deviated nasal septum in the mid and posterior aspects of the septum with a large left inferior septal spur.  The bony cartilaginous junction was crossed at the midline and a mucoperiosteal flap was elevated along the patients right hand side.  Most of the cartilaginous aspect of the septum was ossified.  The anterior nasal dorsum and columellar  cartilages were left intact as these were not deviated, but the mid and posterior  aspects were resected.  With removal of deviated bone and cartilage, the inferior septal spur was resected using a 4 mm osteotome preserving the overlying mucosa.  The mucoperichondrial flaps were then reapproximated with a 4-0 gut suture on a Keith needle in a horizontal mattressing fashion. Bilateral nasal septal splints were then placed after the application of Bactroban ointment and they were sutured into position with a 3-0 Ethilon suture.  Inferior turbinate cautery was then performed with the cautery set at 12 watts.  Two passes were made in each inferior turbinate in a submucosal fashion with the turbinates adequately cauterized, they were out-fractured to create a more patent nasal cavity.  Nasal cavity and nasopharynx were then thoroughly irrigated.  Attention was then turned to the patients oral cavity and a Crowe-Davis mouthgag was inserted without difficulty.  There were no loose or broken teeth and the hard and soft palate were intact.  The patient was found to have 1+ tonsils.  A tonsillectomy was performed using the Harmonic scalpel, dissecting in a subcapsular fashion beginning on the left hand side, the entire left tonsil was removed.  Dissection was carried out from the superior pole to tongue base.  The right tonsil was removed in a similar fashion.  Spot cautery was then used to control hemostasis.  Tonsillar fossae were gently abraded with a dry tonsillar sponge, Crowe-Davis mouthgag was released and reapplied and there was no active bleeding.  Attention was then turned to the patients palate.  A uvulopalatopharyngoplasty was then performed approximately the posterior extent of the palate approximately 1 cm palatal tissue, including the uvula and the anterior tonsillar pillar, the superior aspect of the anterior tonsillar pillar was resected using the Harmonic scalpel.  Dissection was carried along the palate from superior tonsillar fossa to superior  tonsillar fossae on each side.  Dissection was bevelled from anterior to posterior in order to create a posterior mucosal flap and resected tissue was then removed  and sent to pathology for gross and microscopic evaluation.  With excessive tissue resected, suspensory sutures were then placed.  These consisted of a 3-0 Vicryl suture on a taper needle in a horizontal mattressing fashion to advance the posterior tonsillar pillars anteriorly and coapt them.  The mucosal margin of the palate was then closed with interrupted 3-0 Vicryl sutures to reapproximate the palatal margin.  The patients oral cavity, oropharynx, nasal cavity and nasopharynx were then thoroughly irrigated and suctioned and an oral gastric tube was passed and the stomach contents were aspirated.  Crowe-Davis mouthgag was released and removed and again there were no loose or broken teeth and no active bleeding.  The patient was then awakened from his anesthetic and extubated without difficulty and was transferred from the operating room to the recovery room in stable condition. DD:  01/12/01 TD:  01/12/01 Job: [id number]

## 2011-04-23 NOTE — H&P (Signed)
[place]. [facility]  Patient:    [name] [name], [name] [name] Visit Number: [id number] MRN: [id number]          Service Type: MED Location: [phone] Attending Physician:  [name]. Iii Dictated by:   [name] [name], N.P. Admit Date:  05/31/2002   CC:         [name] Chad. Shelia Media, [name]   History and Physical  DATE OF BIRTH:  September 09, 1944  PRIMARY CARE [name] Benge:  [name] Chad. Shelia Media, [name]  IMPRESSION:  Symptoms suspicious for unstable angina pectoris in this 65 year old, diabetic, male, with history of dyslipidemia and known history of coronary artery disease, status post CABG three years earlier and follow-up cardiac catheterization September 2001, revealing patent grafts but some progression of native coronary artery disease.  Currently the patient is pain free, and his first set of enzymes are negative.  His EKG is unchanged from that done September 2001.  Note, the patient had evidence of coronary artery spasm at the time of his heart cath, September 2001, and he has been treated with nitrates for this since cath.  (As dictated by Dr. Daneen [name])  [name]: 1. Coronary artery disease.    a. CABG, October 2000, Dr. Tharon Aquas Trigt.    b. Cardiac catheterization, September 2001, revealing native vessel 60%       ostial LAD, 90% distal in-stent LAD stenosis.  Diagonal 1 90%.  Diagonal       2 patent.  Diagonal 3 90% in-stent restenosis.  Native CFX was a small,       OM-1 70% stenosed.  OM-3 and OM-4 were 40%.  It was noted that he had       diffuse coronary artery spasm.  RCA 50% mid.  Bypass grafts:  LIMA to       LAD patent.  SVG to PDA patent.  SVG to diagonal 3:  50% ostial.  SVG to       diagonal 2 patent. 2. Diabetes mellitus for 13 years. 3. Dyslipidemia for which he has been on Lipitor in the past causing a    decrease in HDL.  Currently on fish oil and Advicor. 4. Depression, good control on Wellbutrin and Zoloft. 5. Right ankle  discomfort since bypass surgery for which he is being treated    with Celebrex. 6. History of obstructive sleep apnea for which he had a    uvulopalatopharyngoplasty, nasal septoplasty, and tonsillectomy.  PLAN:  (As dictated by Dr. Daneen [name]) 1. The patient counseled to undergo for native coronary and bypass graft    angiography with possible percutaneous intervention if indicated and able.    Risks, potential complications, benefits, alternatives to the procedure    were discussed in detail.   The patient indicates that his questions and    concerns have been addressed and is agreeable to proceed.  Heart cath is    scheduled for June 04, 2002, a.m. 2. IV nitrates, IV heparin, aspirin.  Will hold glucophage. 3. Telemetry. 4. Serial cardiac enzymes to rule out for myocardial infarction.  Daily EKG. 5. Oxygen and aspirin.  HISTORY OF PRESENT ILLNESS:  [name] [name] is a pleasant 65 year old diabetic male with a history of CABG October 2000, with subsequent cardiac catheterization September 2001, revealing patent bypass grafts, element of coronary artery spasm.  He did have progression of native vessel CAD of OM-1 and diagonal 1.  He was medically managed thereafter.  The patient with a three  day history of malaise/fatigue; emesis two days earlier.  He had bilateral throat discomfort radiating to the his left jaw. It seemed exacerbated with activity; noted more dyspneic with exertion. Today, he had a left shoulder sharp, constant pain which waxed and waned in intensity.  He took two sublingual nitrates at home and had additional two sublinguals en route to ER with improvement.  He is currently pain free on IV nitrates.  The patient claims his epigastric pain exacerbated and precipitated by deep inspiration.  PAST SURGICAL HISTORY: 1. (February 2002):  Uvulopalatopharyngoplasty, nasal septoplasty, and    tonsillectomy for treatment of obstructive sleep apnea. 2. (October 2000):   CABG.  ALLERGIES:  MORPHINE.  MEDICATIONS:  1. Glucotrol XL 10 mg p.o. b.i.d.  2. Glucophage 500 mg 1 p.o. q.h.s.  3. Avandia 4 mg p.o. b.i.d.  4. Fish oil tabs q.d.  5. Imdur 60 mg p.o. q.d.  6. Nitroglycerin tabs 0.4 sublingual p.r.n. chest pain.  7. Celebrex 200 mg p.o. q.d.  8. Bextra 10 mg p.o. q.d.  I am not sure if he is taking both Celebrex and     Bextra together.  9. Prilosec 20 mg p.o. q.d. 10. Aspirin 325 mg q.d. 11. Altace 2.5 mg p.o. q.d. 12. Advicor 500/20 p.o. q.h.s. 13. Zoloft 100 mg p.o. q.d. 14. Wellbutrin 150 mg p.o. b.i.d. 15. Clarinex 5 mg p.o. q.d.  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  Married for 19 years.  He has four children, all girls.  Alive and well.  FAMILY HISTORY:  Mother died age 57 CAD.  Dad died age 36, had prior MI age 88.  His death was related to ventricular arrhythmias.  His mother had prior bypass graft surgery.  One brother with CAD, one brother with diabetes, one brother alive and well.  REVIEW OF SYSTEMS:  As in HPI, past medical history, otherwise complaints with problems with lightheadedness with bending over.  He does have decreased bilateral hearing.  Negative dysphagia with food or fluid.  Does complain of right hand arthritic-type complaints.  Right lower extremity foot swelling. This is episodic.  Positive DOE, worse in the heat.  Positive episodes of PND. GI/GU:  Negative.  Otherwise review of systems normal.  PHYSICAL EXAMINATION:  (As dictated by Dr. Daneen [name])  VITAL SIGNS:  Blood pressure 137/65, heart rate 81 and regular, respiratory rate 20, temperature 97.0, O2 saturation 95% on room air.  GENERAL:  He is a well-nourished, pleasantly conversant, middle-aged male in no apparent distress.  His wife and older daughter are in attendance.  HEENT:  Brisk bilateral carotid upstroke without bruit.  NECK:  Significant JVD, no  thyromegaly.   CHEST:  Sounds clear without excursion.  Negative CVA tenderness.  CARDIAC:  Regular rate and rhythm without murmur, rub or gallop.  Normal S1, S2.  ABDOMEN:  Soft, nondistended, normoactive bowel sounds.  Negative aortic, femoral bruit.  Diffuse discomfort to applied pressure.  No masses or organomegaly appreciated.  EXTREMITIES:  Good distal pulses.  Negative pedal edema.  NEUROLOGIC:  Cranial nerves 2-12 grossly intact.  Alert and oriented x 3.  GENITOURINARY/RECTAL:  Deferred.  LABORATORY TESTS TODAY:  Sodium 137, potassium 3.9, chloride 108, CO2 22, glucose 163, BUN 18, creatinine 1.1.  LFTs within normal range.  Hemoglobin 13.4, hematocrit 38.8, WBC 5.6, platelets 165.  CK 132, MB fraction 1.6, troponin I 0.01.  Pro time 13, INR 0.9.  PTT 29.  Chest x-ray reveals changes consistent with prior CABG.  No active disease. EKG reveals NSR, right bundle-branch block, T wave inversion in V1 through V3 without significant change from EKG from September 2001. Dictated by:   [name] [name], N.P. Attending Physician:  [name]. Iii DD:  05/31/02 TD:  05/31/02 Job: [id number]

## 2011-04-23 NOTE — Discharge Summary (Signed)
[place]. [facility]  Patient:    [name] [name]                     MRN: [id number] Adm. Date:  [id number] Disc. Date: 01/13/99 Attending:  [name]. Iii Dictator:   Baker Janus, P.A. CC:         Jeanella Craze. Little, [name]             [name], [name]             Haze Rushing, [name], [place], [place]                           Discharge Summary  DATE OF BIRTH:  Nov 16, 1946  DISCHARGE DIAGNOSES:  1. Noncardiac chest pain believed to be related to Glucophage.  2. Adult onset diabetes mellitus.  3. Hyperlipidemia.  4. Status post stent placement x 2 in February 1999.  DISCHARGE MEDICATIONS:  1. Plavix 75 mg p.o. q.d.  2. Imdur 30 mg 1/2 tablet p.o. q.d.  3. Toprol XL 50 mg p.o. q.d.  4. Norvasc 5 mg p.o. q.d.  5. Prevacid 30 mg p.o. q.d.  6. Zoloft 50 mg p.o. q.d.  7. Glucotrol 5 mg p.o. q.d.  8. Enteric-coated aspirin 325 mg p.o. q.d.  9. Nitroglycerin sublingual p.r.n. 10. Niaspan 750 mg p.o. q.d.  CONSULTATIONS:  None.  PROCEDURES:  Cardiac catheterization on January 12, 1999, by Dr. Aldona Bar, without intervention.  CONDITION ON DISCHARGE:  No chest pain, no shortness of breath, no dizziness, no arrhythmias, no edema on ambulation in the hallway.  Much improved.  PHYSICAL EXAMINATION ON DISCHARGE:  Blood pressure 110/70, pulse 55, normal sinus rhythm.  No chest pain or cramping.  Patient up in room.  Catheterization site okay.  DISCHARGE INSTRUCTIONS:  The patient may resume normal activity in 24-48 hours. No strenuous activity or lifting greater than 10 pounds for the next 48 hours. May drive in 24 hours.  Maintain low salt, low fat, diabetic diet.  May shower or bathe with soap and water to right groin site.  Observe for severe bruising, inflammation, or bleeding.  Observe for fever greater than 101.  Absolutely no Glucophage.  FOLLOW-UP:  With Dr. Gwenlyn Found in one to two weeks time.  Call the office  for appointment.  HISTORY OF PRESENT ILLNESS:  [age]-year-old white male with stent placement x 2 in February 1999.  Approximately two weeks ago started having chest "cramp" with radiation down left arm, associated with shortness of breath, nausea, diaphoresis. Usually episode only lasted a few minutes.  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  Did not take any nitroglycerin, and was pain-free on arrival.  The  patient is a nonsmoker, but with a history of adult onset diabetes and family history of CAD.  ALLERGIES:  No known drug allergies.  FAMILY HISTORY, SOCIAL HISTORY, REVIEW OF SYSTEMS:  Please see H&P.  LABORATORY DATA:  EKG done on January 11, 1999, at 2139 showed a normal sinus rhythm.  January 11, 1999, white blood count 33.7, hemoglobin 14, hematocrit 41.1, platelets 259.  Sodium 131, potassium 4.6, chloride 92, CO2 16, glucose 492, BUN 22, creatinine 1.6, total bilirubin 1.4, alkaline phosphatase 144, SGOT 14, total protein 6.5, albumin 3.5, calcium 8.8.  Total cholesterol 152, triglycerides 137, HDL 39, LDL cholesterol 86.  UA showed a pH  of 5.5, glucose greater than 1000, ketones greater than 80, trace of blood, protein 100, urobilinogen 1, and a few  bacteria.  Urine culture showed granular casts present and hyaline casts present. Blood culture x 2 negative.  Urine culture showed no growth.  January 12, 1999,  white blood count 20.4, hemoglobin 13, hematocrit 38.4, platelets 273.  Sodium 34, potassium 4.3, chloride 99, CO2 20, glucose 244, BUN 14, creatinine 1.2, total bilirubin 0.6, alkaline phosphatase 105, SGOT 8, total protein 5.8, albumin 3.1, calcium 8.2.  Urinalysis showed a pH of 5.5, glucose greater than 1000, ketones 40, trace of blood, proteins 30, and a few bacteria.  Portable chest x-ray on January 11, 1999, showed no active chest disease.  HOSPITAL COURSE:   The patient was admitted on January 11, 1999, pain-free without complications.  On January 12, 1999, vital signs stable.  On telemetry patient ith sinus bradycardia with a rate of 51.  Patient was on nitroglycerin drip with 3 c an hour.  Laboratories were within normal limits, and cardiac enzymes were negative.  The patient complained of mild tenderness on left parasternal area. No other complaints.  Cardiac catheterization was planned for the day.  Conclusion of cardiac catheterization showed normal left ventricular systolic function with widely patent stent of both the left anterior descending and second diagonal. There were minor irregularities in the left anterior descending and right coronary artery.  It was concluded that the patients chest pain is secondary to coronary  artery disease.  If the patient is having anginal symptoms, it would be ______ to the distal second diagonal, which is a very small vessel, too small to treat otherwise except for medical therapy.  Another possibility for the patients chest pain would be the Glucotrol, since in some people this can cause GI discomfort nd mimic cardiac pain.  At this point, IV nitroglycerin and heparin were stopped, nd Imdur was added.  The patient was to continue on Plavix and be discharged home n the morning if no complications arose.  The patients left main was normal.  In he proximal portion of the LAD there was a stent that was widely patent.  The mid nd distal LAD were free of significant disease.  First diagonal was widely patent.  The second diagonal had a stent of its proximal segment, which was less than 30% ostial narrowing.  The distal portion of the second diagonal was a very small vessel with moderate irregularities.  The circumflex was free of disease.  The right coronary had some minor irregularities.  The patient tolerated the cardiac catheterization well, without any complications.  On January 13, 1999, the patient had no chest pain or cramping.  The patient was up  in the room.  Vital signs were stable.  Laboratories had all come back within normal limits.  Catheterization site was okay.  The patient was to be discharged home that morning, and to follow up with Dr. Gwenlyn Found in two to three weeks.  The patient was also allowed to return to work on Thursday.  The patient was told absolutely no Glucophage to be taken again.  The patient was told to contact his primary physician about these changes. DD:  01/21/99 TD:  01/23/99 Job: [id number]

## 2011-04-23 NOTE — H&P (Signed)
[place]. [facility]  Patient:    [name] [name], [name] [name]                    MRN: [id number] Adm. Date:  [id number] Attending:  [name]. Iii CC:         [name], [name]  Illene Labrador, [name]  Len Childs, [name]   History and Physical  CHIEF COMPLAINT:  Chest pain.  HISTORY OF PRESENT ILLNESS:  This is a 65 year old married Caucasian male diabetic, admitted with chest pain.  The patient has a history of known coronary artery disease and had coronary artery bypass graft surgery on September 21, 1999, by Dr. Tharon Aquas Trigt.  He had two previous angioplasties with stents.  Post-CABG, the patient has done well until about a month ago, when he noted recurrent chest pain with exertion, relieved by rest.  The pain was similar to what he had experienced prior to his bypass.  Because of these symptoms, he went to see Dr. Daneen Schick, who performed a treadmill Cardiolite stress test on him.  The stress test was positive for ischemia, and the patient was scheduled for cardiac catheterization August 29, 2000.  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  He took nitroglycerin x 1, which relieved, and he was able to go back to work after resting for awhile in the air conditioning.  Pain recurred later as he was finishing work, and he took another nitroglycerin when he got home.  When he told his wife about it, they agreed that he should come to the emergency room.  MEDICATIONS:  Glucotrol 10 mg b.i.d., Avandia 4 mg b.i.d., Lipitor 10 mg half-tablet daily, Toprol XL 50 mg once a day, Zoloft 10 mg daily, Prilosec 20 mg daily, Vioxx uncertain dose daily, Ecotrin 325 mg daily, Nitrostat one _____ sublingually p.r.n., nitroglycerin patch 0.1 mg each morning, removed each evening.  SOCIAL HISTORY:  He is married.  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   He has four children, two by his present marriage.  The patient is a nonsmoker, does not use alcohol.  ALLERGIES:  No known drug allergies.  PAST SURGICAL HISTORY:  Coronary bypass grafting surgery.  REVIEW OF SYSTEMS:  GASTROINTESTINAL:  No recent history of any dyspepsia or hiatal hernia symptoms.  GENITOURINARY:  Remote history of kidney stones but no recent symptoms.  RESPIRATORY:  No cough or sputum production.  ENDOCRINE: He has been a noninsulin-dependent diabetic for 11 years.  PHYSICAL EXAMINATION:  VITAL SIGNS:  Blood pressure is 136/74, pulse is 58 and regular, respirations are normal.  HEENT:  Carotids:  No bruits, jugular venous pressure _____.  CHEST:  Clear.  HEART:  Regular sinus rhythm, no murmur, gallop, or rub.  ABDOMEN:  Soft, nontender.  EXTREMITIES:  Good pulses, no edema.  DIAGNOSTIC EVALUATION:  EKG shows normal sinus rhythm with a right bundle branch block, no acute change.  Chest x-ray shows no active disease. Laboratory studies show a CBC normal.  CK-MB 1.1, troponin I 0.06.  ISTAT is unremarkable.  DIAGNOSTIC IMPRESSION: 1. Chest pain, rule out myocardial infarction. 2. Recurrent angina pectoris. 3. Status post coronary artery bypass graft October 2000. 4. Old right bundle branch block. 5. Diabetes mellitus. 6. Hyperlipidemia.  DISPOSITION:  Admit to Dr. Daneen Schick to telemetry.  Serial enzymes will be obtained.  He did receive 90 mg  of Lovenox subcutaneously in the emergency room.  He will be given IV nitroglycerin, will continue beta blockers. Anticipate probable cardiac catheterization on September 7 by Dr. Daneen Schick if the schedule permits. DD:  08/11/00 TD:  08/12/00 Job: [id number]

## 2011-04-23 NOTE — H&P (Signed)
NAMEYUNIEL, SAWHNEY NO.:  [id number]   MEDICAL RECORD NO.:  [id number]          PATIENT TYPE:  EMS   LOCATION:  [facility]                         FACILITY:  [facility]   PHYSICIAN:  Ethelle Lyon, [name] LHCDATE OF BIRTH:  1946-04-05   DATE OF ADMISSION:  03/01/2006  DATE OF DISCHARGE:                                HISTORY & PHYSICAL   PRIMARY CARE PHYSICIAN:  Drema Pry, D.O. [facility].   PRIMARY CARDIOLOGIST:  Dola Argyle, [name]   PATIENT PROFILE:  A 65 year old, married, white male with a prior history of  CAD who presents with unstable angina.   PROBLEM LIST:  1.  Unstable angina/CAD.      1.  Status post PCI and stenting of the LAD in 1999.      2.  September 21, 1999, CABG x4:  LIMA to LAD, vein graft to          anterolateral, vein graft to D-2, and vein graft to PDA.      3.  August 26, 2005, cardiac catheterization:  Native LAD and PDA          disease, widely patent grafts.  2.  Hypertension.  3.  Hyperlipidemia.  4.  Type 2 diabetes mellitus.  5.  Depression and anxiety.  6.  Obstructive sleep apnea.   HISTORY OF PRESENT ILLNESS:  A 65 year old, married, white male with a  history of CAD status post CABG x4, in October 2000, with multiple caths  since then with the last, occurring in September 2006, revealing patent  grafts.  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  Approximately two weeks ago, he began to experience daily exertional 7 out  of 10 substernal chest tightness with radiation to the neck and jaw  associated with nausea, diaphoresis, and shortness of breath, lasting  approximately 15 minutes and relieved with rest.  The symptoms have been  more frequent and severe times the past three days and now are occurring  with minimal exertion.  This morning, while at work, he was not exerting  himself too heavily and had sudden onset of 8 out of 10 substernal chest  tightness with shortness of breath prompting him to take a  nitroglycerin  which relieved his discomfort in approximately 15 minutes.  He then got up  and walked down a flat hallway and after a short distance had recurrent  7  out of 10 chest tightness with marked shortness of breath and diaphoresis as  well as nausea.  He then went outside and sat down and called his wife.  By  the time she had arrived, approximately 20 minutes later, he was pain free.  She then drove him to the ED.  He is currently pain free and ECG is without  acute changes.  His cardiac markers are negative x1.  He notes that his  symptoms are similar to previous angina and worse than what he had been  experiencing prior to previous cath secondary to the significant shortness  of breath.   ALLERGIES:  MORPHINE SULFATE.   HOME MEDICATIONS:  1.  Metformin 1,000  mg b.i.d.  2.  Glipizide ER 10 mg b.i.d.  3.  Lantus 40 units q.h.s.  4.  Zoloft 100 mg every day.  5.  Imdur 60 mg every day.  6.  Altace 2.5 mg every day.  7.  Aspirin 325 mg every day.  8.  Advicor 500/20 mg every day.  9.  Alprazolam 0.5 mg q.h.s.   FAMILY HISTORY:  Mother died of CAD at 39.  Father died of CAD at 38.  He  had his first MI at 77.  He has three brothers, one has CAD, one has  diabetes, and the other is alive and well.   REVIEW OF SYSTEMS:  Positive for chest pain, shortness of breath, dyspnea on  exertion, diaphoresis, depression, anxiety, nausea.  All other systems  reviewed and negative.   PHYSICAL EXAMINATION:  VITAL SIGNS:  Temperature 98.4, heart rate 73,  respirations 20, blood pressure 128/69, pulse ox 98% on room air.  GENERAL:  A pleasant white male in no acute distress.  Awake, alert, and  oriented x3.  NECK:  Normal carotid upstrokes.  No bruits or JVD.  LUNGS:  Respirations regular non-labored.  Clear to auscultation.  CARDIAC:  Regular S1 S2.  No S3, S4, or murmurs.  ABDOMEN:  Round, soft, nontender, nondistended.  Bowel sounds present x4.  EXTREMITIES:  Warm, dry, pink.  No  clubbing, cyanosis, or edema.  Dorsalis  pedis posterior tibial pulses 2+ and equal bilaterally.  HEENT:  Atraumatic, normocephalic.  NEUROLOGIC:  Cranial nerves II-XII grossly intact.  No focal deficit.   Chest x-ray shows no active cardiopulmonary disease.  ECG shows sinus  bradycardia with a rate of 59, normal axis, and a right bundle branch block.  No acute ST-T changes.   LABORATORY:  Hemoglobin 13.6, hematocrit 40.0.  Sodium 137, potassium 3.9,  chloride 109, CO2 18.9, BUN 17, creatinine 1.0, glucose 155.  CK-MB 1.3,  troponin I less than 0.05.   ASSESSMENT/PLAN:  1.  Unstable angina/coronary artery disease.  The patient presents with a      two-week history of progressive exertional chest pain similar to pre-      percutaneous coronary intervention/pre coronary artery bypass graft      chest pain.  He has had multiple catheterizations with patent grafts,      and he notes the symptoms are different to what he had been experiencing      at those times and that he is now markedly short of breath with      exertion.  Plan to admit, cycle enzymes, follow up electrocardiogram      (currently no changes), and perform cardiac catheterization tomorrow.      We will hold Glucophage and continue aspirin, Statin, ACE inhibitor,      nitrates.  We will add low does beta-blocker.  2.  Hypertension, stable.  3.  Hyperlipidemia.  Continue Advicor.  Check fasting lipids and LFTs.  4.  Type 2 diabetes mellitus.  We will hold Metformin.  Continue Lantus and      add sliding scale insulin as needed.  5.  Depression/anxiety.  Continue Zoloft and h.s. alprazolam.      Rogelia Mire, NP      Ethelle Lyon, [name] [facility]  Electronically Signed    CRB/MEDQ  D:  03/01/2006  T:  03/02/2006  Job:  [id number]

## 2011-04-23 NOTE — Cardiovascular Report (Signed)
[place]. [facility]  Patient:    [name] [name], [name] [name]                    MRN: [id number] Proc. Date: 08/12/00 Adm. Date:  [id number] Attending:  [name]. Iii CC:         Cardiac Catheterization Laboratory  [name], [name]  Len Childs, [name]   Cardiac Catheterization  REASON FOR PRESENTATION:  Exertional angina, recent Cardiolite demonstrating anterior ischemia in a localized area, very possibly in the distribution of the diagonal.  PROCEDURES PERFORMED: 1. Left heart catheterization. 2. Selective coronary angiography. 3. Left ventriculography. 4. Saphenous vein graft angiography. 5. Left internal mammary artery angiography.  RESULTS:  I:  LEFT VENTRICULOGRAPHY:  The left ventricle is normal in size and demonstrates normal contractility.  No regional wall motion abnormality is noted.  LVEF is 60%.  II:  HEMODYNAMIC DATA:     a. The aortic pressure 124/68 mmHg.     b. Left ventricular pressure 124/12 mmHg.  III:  CORONARY ANGIOGRAPHY:     a. Left main:  Widely patent.     b. Left anterior descending coronary artery:  The LAD contains an        ostial 60% stenosis.  There was a tiny first diagonal vessel that        contains an ostial 80-90% stenosis.  The second diagonal is open and        there is reflux of contrast into a saphenous vein graft that supplies        this branch.  The third diagonal contains a stent and this appears        to be severely and diffusely diseased and very small in caliber.  No        competitive flow is noted.     c. Circumflex artery:  The circumflex artery for the most part is widely        patent.  The third and fourth obtuse marginal branches are large        and arise distally, each containing an ostial 40% stenosis.  The        first obtuse marginal is small and contains a 50-70% mid vessel        stenosis.  This is unchanged from previous angiogram.     d. Right coronary artery:  There was  a 50% mid vessel stenosis.  The        distal right coronary before the PDA causes reflux of contrast into        the saphenous vein graft.  IV:  BYPASS GRAFT ANGIOGRAPHY:     a. Saphenous vein graft to the distal right coronary before the        PDA:  This is widely patent.     b. Saphenous vein graft to the small third diagonal branch which contains        a stent:  This graft is widely patent.  There is 50% ostial stenosis of        the graft.  Flow into the diagonal is sluggish and there is contrast        spaces within the graft because of sluggish runoff.     c. The saphenous vein graft to the second diagonal is widely patent.        There is perhaps 20% narrowing in the saphenous vein graft.  V:  LEFT INTERNAL MAMMARY GRAFT TO THE LEFT  ANTERIOR DESCENDING:  This graft is widely patent.  CONCLUSIONS: 1. The saphenous vein grafts are all patent.  The saphenous vein graft to the    small third diagonal branch contains a 50% ostial stenosis.  The    left internal mammary graft to the left anterior descending is widely    patent. 2. There is significant native vessel coronary disease.  There is a 50%    mid native right coronary stenosis, there is a 80% stenosis in the    very small first diagonal (unchanged), there is 90% in-stent re-stenosis    in the third diagonal, there is 90% in-stent re-stenosis in the distal    left anterior descending stent. 3. There was diffuse left anterior descending spasm despite intravenous    nitroglycerin on the first angiogram.  Intracoronary nitroglycerin brought    about significant improvement in vessel caliber.  PLAN:  Medical therapy.  Increase topical nitrites.  Hopeful discharge in a.m. D:  08/12/00 TD:  08/13/00 Job: [id number]

## 2011-04-23 NOTE — Cardiovascular Report (Signed)
[name] [name], [name] [name]             ACCOUNT NO.:  [id number]   MEDICAL RECORD NO.:  [id number]          PATIENT TYPE:  OIB   LOCATION:  1961                         FACILITY:  [facility]   PHYSICIAN:  Ethelle Lyon, [name] LHCDATE OF BIRTH:  1946/06/11   DATE OF PROCEDURE:  08/26/2005  DATE OF DISCHARGE:  08/26/2005                              CARDIAC CATHETERIZATION   PROCEDURE:  Left heart catheterization, left ventriculography, coronary  angiography, saphenous vein graft angiography x 3, left internal mammary  angiography, abdominal aortography.   INDICATIONS FOR PROCEDURE:  [name] [name] is a 65 year old gentleman with  coronary disease.  He is status post stenting to the LAD in 1999 followed by  coronary artery bypass grafting in 2000.  Since his bypass grafting, he has  been plagued by atypical chest discomfort.  This has lead to multiple stress  tests which have shown no evidence of ischemia and repeat cardiac  catheterization in 2003 and 2005, both of which showed all the grafts to be  patent with preserved left ventricular systolic function.  Symptoms  continue.  He recently sought a second opinion from Dr. [name] who referred  him for repeat angiography.   PROCEDURE TECHNIQUE:  Informed consent was obtained.  Under 1% lidocaine  local anesthesia, a 4 French sheath was placed in the right common femoral  artery using the modified Seldinger technique.  Diagnostic angiography of  the native systems was performed using JL4 and JR4 catheters.  JR4 catheter  was used for each of the vein grafts to the left system.  AL1 catheter was  used for the vein graft to the RCA, JR4 was used for the left subclavian,  and LIMA catheter for the left internal mammary artery.  Left heart  catheterization and ventriculography were then performed using a pigtail  catheter.  The pigtail catheter was then pulled back to the suprarenal  abdominal aorta.  Abdominal aortography was performed by power  injection.  The patient tolerated the procedure well and was transferred to the holding  room in stable condition.   COMPLICATIONS:  None.   FINDINGS:  1.  Left ventricle:  118/7/11.  EF 60% with focal mid anterior hypokinesis.  2.  Left main:  Angiographically normal.  3.  LAD:  Moderate size vessel reaching the apex of the heart and giving      rise to a moderate size first and small second diagonal branch.  There      is a previously placed stent in the mid LAD just after the take off of      the first diagonal.  The stent has occlusive in stent restenosis.  There      is a LIMA graft to the mid LAD distal to the stented segment which is      widely patent with excellent distal run off.  There is a 40% stenosis of      the proximal LAD just before the take off of the first diagonal.  The      vein graft to the diagonal is widely patent with excellent distal run  off.  The second diagonal is occluded at its origin.  The saphenous vein      graft to the small vessel is widely patent with excellent distal run      off.  4.  Circumflex:  Relatively large vessel giving rise to three obtuse      marginals.  It is angiographically normal.  5.  Right coronary artery:  Moderate size dominant vessel.  The native      vessel supplies the PDA.  There is no significant stenosis.  However,      the posterior left ventricular branch has a subtotal occlusion at its      origin.  The vein graft to this posterior left ventricular branch is      widely patent with excellent distal run off.  6.  Left subclavian:  Widely patent without stenosis.  7.  Abdominal aorta:  Minimal plaquing and no evidence of aneurysm.  8.  Renal arteries:  Single vessels bilaterally.  Both are normal.   IMPRESSION/RECOMMENDATION:  The patient has widely patent bypass grafts and  no significant stenoses within the bypassed native circulation.  In short,  he has excellent flow to all myocardial territories.  Left  ventricular  systolic function remains overall normal with a small area of anterior  hypokinesis.  I suspect this chest pain is  noncardiac in etiology.  I have  asked him to follow up with Dr. [name] for discussion of further evaluation.      Ethelle Lyon, [name] [facility]  Electronically Signed     WED/MEDQ  D:  08/26/2005  T:  08/26/2005  Job:  [id number]   cc:   Dola Argyle, [name]  [address] [place]  [place] [address]

## 2011-04-26 DIAGNOSIS — G4700 Insomnia, unspecified: Secondary | ICD-10-CM | POA: Insufficient documentation

## 2011-04-26 MED ORDER — INSULIN LISPRO 100 UNIT/ML ~~LOC~~ SOLN: SUBCUTANEOUS | Status: DC

## 2011-04-26 MED ORDER — GABAPENTIN 300 MG PO CAPS: 300.0000 mg | ORAL_CAPSULE | Freq: Every evening | ORAL | Status: DC

## 2011-04-26 NOTE — Assessment & Plan Note (Signed)
I would like to avoid use of sedatives if possible Reviewed sleep hygiene handout Trial of OTC melatonin

## 2011-04-26 NOTE — Assessment & Plan Note (Addendum)
Pt experiencing intermittent hypoglycemia.  We reviewed adjusting dose based upon carb intake.  If persistent hypoglycemia, refer to diabetic educator Leonia Reader) for further insulin teaching.  Lab Results  Component Value Date   HGBA1C 9.2* 03/01/2011   Lab Results  Component Value Date   CREATININE 0.93 12/18/2010

## 2011-04-27 ENCOUNTER — Other Ambulatory Visit: Payer: Self-pay | Admitting: Gastroenterology

## 2011-05-11 ENCOUNTER — Encounter: Payer: Self-pay | Admitting: Family Medicine

## 2011-05-11 ENCOUNTER — Ambulatory Visit (INDEPENDENT_AMBULATORY_CARE_PROVIDER_SITE_OTHER): Payer: 59 | Admitting: Family Medicine

## 2011-05-11 DIAGNOSIS — K219 Gastro-esophageal reflux disease without esophagitis: Secondary | ICD-10-CM

## 2011-05-11 DIAGNOSIS — E119 Type 2 diabetes mellitus without complications: Secondary | ICD-10-CM

## 2011-05-11 DIAGNOSIS — R079 Chest pain, unspecified: Secondary | ICD-10-CM

## 2011-05-11 MED ORDER — DICLOFENAC SODIUM 1 % TD GEL: 1.0000 "application " | Freq: Four times a day (QID) | TRANSDERMAL | Status: DC

## 2011-05-11 MED ORDER — PANTOPRAZOLE SODIUM 40 MG PO TBEC: 40.0000 mg | DELAYED_RELEASE_TABLET | Freq: Every day | ORAL | Status: DC

## 2011-05-11 NOTE — Progress Notes (Signed)
OFFICE VISIT  05/11/2011   CC:  Chief Complaint  Patient presents with  . Diabetes    problems with short term insulin  . Medication Refill    Rx for protonix-sick after eating     HPI:    Patient is a 65 y.o. Caucasian male who presents for f/u DM 2.   Had been having some problems with hypoglycemia last o/v.   Denies any problems with this lately.  Does say that avg fasting gluc is around 150, and avg 2H PP is hard to say b/c of very erratic control. He seems to think he doesn't vary intake much, but sometimes PP gluc is in the 300s and sometimes around high 100s.   Currently on 33 units lantus but hasn't titrated this in about 3 wks despite still having elevated fasting glucoses. Currently giving himself 5-8 units of humalog with meals.  Also reports chest pains the last 89mo.  Onset of left sided achy CP with "moving around a lot" and this abates with rest.  Occasional sweating with this but no SOB, arm pain, palpitations, or nausea.  Last cardiology f/u was 12/2010 and no changes were made.  Last cath 01/2010 showed patent grafts and stent but mildly increased distal disease--med mgmt recommended.  He had been doing well until 2 mo ago.  He asks for RF of protonix, says his GERD manifests as postprandial nausea and protonix helps immensely.  He reports a past history of PUD and has seen Dr. Sharlett Iles and is due for routine f/u.  Past Medical History  Diagnosis Date  . Diabetes mellitus type II, uncontrolled   . CAD (coronary artery disease)   . OSA (obstructive sleep apnea)   . Hyperlipidemia   . RBBB (right bundle branch block)   . Depression     Bipolar  . Edema   . Hypertension   . Falling episodes     these have occurred in the past and again recurring 2011  . Spondylosis     C5-6, C6-7 MRI 2010    Past Surgical History  Procedure Date  . Nasal septum surgery     UP3  . Coronary artery bypass graft     2000    Outpatient Prescriptions Prior to Visit    Medication Sig Dispense Refill  . aspirin 81 MG tablet Take 81 mg by mouth daily.        . clopidogrel (PLAVIX) 75 MG tablet Take 75 mg by mouth daily.        . [employer] IRON 325 (65 FE) MG tablet TAKE 1 TABLET BY MOUTH TWICE A DAY  60 tablet  2  . fish oil-omega-3 fatty acids 1000 MG capsule Take 2 g by mouth 2 (two) times daily.        Marland Kitchen gabapentin (NEURONTIN) 300 MG capsule Take 1 capsule (300 mg total) by mouth every evening.  90 capsule  0  . glucose blood (FREESTYLE LITE) test strip 1 each by Other route. Use as instructed to test blood sugar three times daily       . insulin glargine (LANTUS SOLOSTAR) 100 UNIT/ML injection Inject  30 units subcutaneously  Once a day  10 mL  3  . insulin lispro (HUMALOG KWIKPEN) 100 UNIT/ML injection As directed before meals and snacks up to 5 times daily.  15 mL  3  . Insulin Pen Needle (NOVOFINE) 30G X 8 MM MISC Inject 1 packet into the skin as needed. Use for insulin injections three  times a day       . isosorbide mononitrate (IMDUR) 60 MG 24 hr tablet Take 60 mg by mouth daily.        Marland Kitchen levothyroxine (SYNTHROID, LEVOTHROID) 75 MCG tablet Take 1 tablet (75 mcg total) by mouth daily.  30 tablet  3  . metFORMIN (GLUCOPHAGE) 1000 MG tablet Take 1,000 mg by mouth 2 (two) times daily.       . nebivolol (BYSTOLIC) 10 MG tablet Take 10 mg by mouth daily.        . nitroGLYCERIN (NITROSTAT) 0.4 MG SL tablet Place 0.4 mg under the tongue every 5 (five) minutes as needed. 1 tablet under tongue at onset of chest pain; may repeat every 5 minutes for up to 3 doses       . ondansetron (ZOFRAN) 8 MG tablet Take by mouth every 12 (twelve) hours as needed.       . pramipexole (MIRAPEX) 0.25 MG tablet Take 0.25 mg by mouth at bedtime.        . sertraline (ZOLOFT) 100 MG tablet Take 1 tablet (100 mg total) by mouth 2 (two) times daily.  60 tablet  1  . simvastatin (ZOCOR) 40 MG tablet Take 40 mg by mouth at bedtime.        . sucralfate (CARAFATE) 1 GM/10ML suspension Take 1 g  by mouth 4 (four) times daily.        . traMADol (ULTRAM) 50 MG tablet Take 50 mg by mouth 2 (two) times daily as needed.        . diclofenac sodium (VOLTAREN) 1 % GEL Apply topically. Apply two times a day as needed       . pantoprazole (PROTONIX) 40 MG tablet Take 40 mg by mouth daily.          Allergies  Allergen Reactions  . Morphine     REACTION: hallucinations    ROS As per HPI  PE: Blood pressure 116/64, pulse 58, temperature 98 F (36.7 C), temperature source Oral, resp. rate 18, weight 191 lb (86.637 kg), SpO2 98.00%. Gen: Alert, well appearing.  Patient is oriented to person, place, time, and situation. Neck: supple, ROM full.  Carotids 2+ bilat, without bruit.  No lymphadenopathy, thyromegaly, or mass. Chest: symmetric expansion, nonlabored respirations.  Clear and equal breath sounds in all lung fields.   CV: RRR, no m/r/g.  Peripheral pulses 2+ and symmetric. EXT: no clubbing, cyanosis, or edema.    LABS:  12 lead EKG today showed NSR, RBBB: no changes from prior EKG done 12/2010.  IMPRESSION AND PLAN:  Chest pain EKG today unchanged. Suspect angina in this pt with known CAD.  No sign of ACS. Recommended he continue current meds and we have arranged f/u with the cardiologist 05/20/11.  He has nitroglycerin to use if he needs it, and he'll go to ED if CP worsening or unresponsive to nitroglycerin, or other new/acute symptoms accompany his CP.  DIABETES MELLITUS, TYPE II Control poor but patient has been hesitant to titrate lantus and novolog. We discussed getting a little more aggressive with this, with a goal of 100-110 consistently for fasting a.m and goal of around 150 consistently at [facility] PP.   Reviewed the effects of exercise on blood sugar levels.   Not time for HbA1c yet.  He is due for D.R. Screening and he'll call to set this up.  Next urine microalb due 11/2011.  F/u in 43mo.  GERD Protonix RF'd today x 90d, no additional  RF.  He is due for routine GI f/u  with Dr. Sharlett Iles and he is going to get this arranged.     FOLLOW UP: Return in about 2 months (around 07/11/2011).

## 2011-05-11 NOTE — Assessment & Plan Note (Signed)
EKG today unchanged. Suspect angina in this pt with known CAD.  No sign of ACS. Recommended he continue current meds and we have arranged f/u with the cardiologist 05/20/11.  He has nitroglycerin to use if he needs it, and he'll go to ED if CP worsening or unresponsive to nitroglycerin, or other new/acute symptoms accompany his CP.

## 2011-05-11 NOTE — Assessment & Plan Note (Addendum)
Protonix RF'd today x 90d, no additional RF.  He is due for routine GI f/u with Dr. Sharlett Iles and he is going to get this arranged.

## 2011-05-11 NOTE — Assessment & Plan Note (Addendum)
Control poor but patient has been hesitant to titrate lantus and novolog. We discussed getting a little more aggressive with this, with a goal of 100-110 consistently for fasting a.m and goal of around 150 consistently at [facility] PP.   Reviewed the effects of exercise on blood sugar levels.   Not time for HbA1c yet.  He is due for D.R. Screening and he'll call to set this up.  Next urine microalb due 11/2011.  F/u in 21mo.

## 2011-05-14 ENCOUNTER — Telehealth: Payer: Self-pay | Admitting: Internal Medicine

## 2011-05-14 MED ORDER — TRIAMCINOLONE ACETONIDE 0.1 % EX OINT: 1.0000 "application " | TOPICAL_OINTMENT | Freq: Two times a day (BID) | CUTANEOUS | Status: DC

## 2011-05-14 NOTE — Telephone Encounter (Signed)
Refill- triamcinolone 0.1% ointment. Apply two times a day to dry hands. Qty 30.0 gm. Last fill 9.22.11

## 2011-05-14 NOTE — Telephone Encounter (Signed)
OK to RF as rx'd x 3--PM

## 2011-05-14 NOTE — Telephone Encounter (Signed)
Rx refill sent to pharmacy. 

## 2011-05-17 ENCOUNTER — Telehealth: Payer: Self-pay | Admitting: Internal Medicine

## 2011-05-17 NOTE — Telephone Encounter (Signed)
Refill tramadol hcl 50 mg tablet qty 30 last fill 11-09-2010 take 1 table by mouth twice a day as needed

## 2011-05-17 NOTE — Telephone Encounter (Signed)
LEVOTHYROXINE 50 MCG TABLET QTY 30 LAST  11-11-2010 TAKE 1 TABLET BY MOUTH EVERY DAY  SERTALINE HCL 100 MG TABLET 180 LAST FILL 01-29-2011 TAKE 1 TABLET TWICE A DAY

## 2011-05-19 ENCOUNTER — Ambulatory Visit: Payer: 59 | Admitting: Internal Medicine

## 2011-05-19 NOTE — Telephone Encounter (Signed)
Patient returned call. He was asked about refill on Tramadol, and could not verify what he was taking the medication for. He was informed that it was a pain medication and stated that he uses it to help him sleep at night, and would like to know if it could be refilled

## 2011-05-19 NOTE — Telephone Encounter (Signed)
Call placed to patient at [phone], no answer. A detailed voice message was left for patient to return call regarding clarification on rx refills. Rx for synthroid sent to pharmacy in April 2012  For qty 90-(check pharmacy for refill on file), Sertraline sent to pharmacy on 5/1- qty 60 with 1 refill, and need to find out why he is requesting refill on Tramadol.

## 2011-05-20 ENCOUNTER — Other Ambulatory Visit: Payer: Self-pay | Admitting: Family Medicine

## 2011-05-20 ENCOUNTER — Ambulatory Visit: Payer: 59 | Admitting: Cardiovascular Disease

## 2011-05-20 NOTE — Telephone Encounter (Signed)
May RF sertraline, synthroid--both as previously prescribed with 5 additional RFs. May RF ultram 50mg , 1-2 tabs po qhs prn, #60, no RF. --PM

## 2011-05-21 MED ORDER — LEVOTHYROXINE SODIUM 75 MCG PO TABS: 75.0000 ug | ORAL_TABLET | Freq: Every day | ORAL | Status: DC

## 2011-05-21 MED ORDER — SERTRALINE HCL 100 MG PO TABS: 100.0000 mg | ORAL_TABLET | Freq: Two times a day (BID) | ORAL | Status: DC

## 2011-05-21 MED ORDER — TRAMADOL HCL 50 MG PO TABS: 50.0000 mg | ORAL_TABLET | Freq: Two times a day (BID) | ORAL | Status: DC | PRN

## 2011-05-21 NOTE — Telephone Encounter (Signed)
Rx refills sent to pharmacy. 

## 2011-05-24 ENCOUNTER — Telehealth: Payer: Self-pay | Admitting: Internal Medicine

## 2011-05-24 MED ORDER — METFORMIN HCL 1000 MG PO TABS: 1000.0000 mg | ORAL_TABLET | Freq: Two times a day (BID) | ORAL | Status: DC

## 2011-05-24 NOTE — Telephone Encounter (Signed)
Rx refill sent to pharmacy. 

## 2011-05-24 NOTE — Telephone Encounter (Signed)
Refill- metformin hcl 1000mg  tablet. Take 1 tablet twice a day. Qty 60. Last fill 5.22.12

## 2011-05-25 ENCOUNTER — Telehealth: Payer: Self-pay | Admitting: Internal Medicine

## 2011-05-25 NOTE — Telephone Encounter (Signed)
Refill- qvar 47mcg inhaler. Use 2 puffs twice a day. Qty 8.7gm. Last fill 4.26.12

## 2011-05-25 NOTE — Telephone Encounter (Signed)
Call placed to patient at  [phone], no answer. A detailed voice message was left for patient to return call regarding refill on Qvar inhaler

## 2011-05-27 NOTE — Telephone Encounter (Signed)
Call placed to patient at [phone], no answer. A detailed voice message was left for patient to return call regarding rx refill for inhaler

## 2011-05-28 NOTE — Telephone Encounter (Signed)
No return call from patient regarding medication refill

## 2011-06-07 ENCOUNTER — Other Ambulatory Visit: Payer: Self-pay | Admitting: Internal Medicine

## 2011-06-07 NOTE — Telephone Encounter (Signed)
Rx refill sent to pharmacy. 

## 2011-07-05 ENCOUNTER — Telehealth: Payer: Self-pay | Admitting: Internal Medicine

## 2011-07-05 NOTE — Telephone Encounter (Signed)
Refill- sertraline hcl 100mg  tablet. Take one tablet by mouth twice a day. Qty 60. Last fill 6.5.12

## 2011-07-05 NOTE — Telephone Encounter (Signed)
Call placed to [place] [phone]; spoke with [name]. He was informed a rx was sent to pharmacy on 05/21/2011 with refills. He has verified the rx has been received

## 2011-07-09 ENCOUNTER — Other Ambulatory Visit: Payer: Self-pay | Admitting: Cardiology

## 2011-07-09 ENCOUNTER — Telehealth: Payer: Self-pay | Admitting: Internal Medicine

## 2011-07-09 ENCOUNTER — Other Ambulatory Visit: Payer: Self-pay | Admitting: Internal Medicine

## 2011-07-09 MED ORDER — PRAMIPEXOLE DIHYDROCHLORIDE 0.25 MG PO TABS: 0.2500 mg | ORAL_TABLET | Freq: Every day | ORAL | Status: DC

## 2011-07-09 NOTE — Telephone Encounter (Signed)
Patient called in stating he needs refills on pramipexole and norvasc sent to [facility] on [address].

## 2011-07-09 NOTE — Telephone Encounter (Signed)
Call was placed to patient at [phone], he was informed Norvasc was changed to Bystolic, and he should have a rx at the pharmacy.  Rx for Mirapex sent to pharmacy.patient aware.

## 2011-07-09 NOTE — Telephone Encounter (Signed)
Rx refill sent to pharmacy. 

## 2011-07-13 ENCOUNTER — Encounter: Payer: Self-pay | Admitting: Family Medicine

## 2011-07-13 ENCOUNTER — Ambulatory Visit (INDEPENDENT_AMBULATORY_CARE_PROVIDER_SITE_OTHER): Payer: 59 | Admitting: Family Medicine

## 2011-07-13 DIAGNOSIS — R609 Edema, unspecified: Secondary | ICD-10-CM

## 2011-07-13 DIAGNOSIS — R079 Chest pain, unspecified: Secondary | ICD-10-CM

## 2011-07-13 MED ORDER — ISOSORBIDE MONONITRATE 30 MG PO TB24: 30.0000 mg | ORAL_TABLET | ORAL | Status: DC

## 2011-07-13 NOTE — Assessment & Plan Note (Signed)
Worrisome for progression of CAD/unstable angina. Discussed pt with Dr. Verl Blalock at [facility] cardiology today and he recommended increasing imdur to 90mg  daily, follow up with Dr. [name] next week. If he has any rest pain or nocturnal chest pain then he is to take sl ntg q21min x 3 and call [phone] if incomplete response.

## 2011-07-13 NOTE — Assessment & Plan Note (Signed)
Suspect mild venous insufficiency.  I really don't detect any significant edema today. No sign of heart failure/volume overload. Discussed sodium restriction, prn elevation of legs.

## 2011-07-13 NOTE — Progress Notes (Signed)
OFFICE VISIT  07/13/2011   CC: No chief complaint on file.    HPI:    Patient is a 65 y.o. Caucasian male who presents for lower extremity swelling. Describes intermittent swelling in lower legs, ankles, feet.  Right greater than left slightly.  No pain. Says it has never been a problem before but I do note it in his problem list from the past. He doesn't watch sodium intake consistently.  Glucoses erratic, some in 100s, some into 300s.  He is titrating insulin slowly.  Also reports ongoing problem with exertional chest pain.  This was a problem when I saw him two months ago and his EKG showed no ischemic changes: no changes compared to EKG 12/2010.  He was supposed to see his cardiologist, Dr. [name], shortly after I saw him but he decided not to go b/c he was going to have to see a different cardiologist whom he was not familiar with.  He describes left sided squeezing CP with exertion that radiates into left arm and is associated with nausea, diaphoresis, palpitations, and SOB.  Over the last 2 mo he notes that it seems to be taking less and less to bring on the pain, longer to go away when he rests.  He takes sl ntg sometimes.  He does not have CP at rest or in hours of sleep.  Past Medical History  Diagnosis Date  . Diabetes mellitus type II, uncontrolled   . CAD (coronary artery disease)   . OSA (obstructive sleep apnea)   . Hyperlipidemia   . RBBB (right bundle branch block)   . Depression     Bipolar  . Edema   . Hypertension   . Falling episodes     these have occurred in the past and again recurring 2011  . Spondylosis     C5-6, C6-7 MRI 2010    Past Surgical History  Procedure Date  . Nasal septum surgery     UP3  . Coronary artery bypass graft     2000    Outpatient Prescriptions Prior to Visit  Medication Sig Dispense Refill  . aspirin 81 MG tablet Take 81 mg by mouth daily.        Marland Kitchen BYSTOLIC 10 MG tablet TAKE 1 TABLET EVERY DAY  30 tablet  3  . clopidogrel  (PLAVIX) 75 MG tablet Take 75 mg by mouth daily.        . [facility] IRON 325 (65 FE) MG tablet TAKE 1 TABLET BY MOUTH TWICE A DAY  60 tablet  2  . diclofenac sodium (VOLTAREN) 1 % GEL Apply 1 application topically 4 (four) times daily. Apply two times a day as needed  1 Tube  3  . fish oil-omega-3 fatty acids 1000 MG capsule Take 2 g by mouth 2 (two) times daily.        Marland Kitchen gabapentin (NEURONTIN) 300 MG capsule TAKE ONE CAPSULE BY MOUTH 3 TIMES A DAY  90 capsule  0  . glucose blood (FREESTYLE LITE) test strip 1 each by Other route. Use as instructed to test blood sugar three times daily       . insulin glargine (LANTUS SOLOSTAR) 100 UNIT/ML injection Inject  30 units subcutaneously  Once a day  10 mL  3  . insulin lispro (HUMALOG KWIKPEN) 100 UNIT/ML injection As directed before meals and snacks up to 5 times daily.  15 mL  3  . Insulin Pen Needle (NOVOFINE) 30G X 8 MM MISC Inject 1  packet into the skin as needed. Use for insulin injections three times a day       . isosorbide mononitrate (IMDUR) 60 MG 24 hr tablet TAKE 1 & 1/2 TABLET BY MOUTH EVERY DAY  45 tablet  7  . levothyroxine (SYNTHROID, LEVOTHROID) 75 MCG tablet Take 1 tablet (75 mcg total) by mouth daily.  30 tablet  5  . metFORMIN (GLUCOPHAGE) 1000 MG tablet Take 1 tablet (1,000 mg total) by mouth 2 (two) times daily.  60 tablet  3  . nitroGLYCERIN (NITROSTAT) 0.4 MG SL tablet Place 0.4 mg under the tongue every 5 (five) minutes as needed. 1 tablet under tongue at onset of chest pain; may repeat every 5 minutes for up to 3 doses       . ondansetron (ZOFRAN) 8 MG tablet Take by mouth every 12 (twelve) hours as needed.       . pantoprazole (PROTONIX) 40 MG tablet Take 1 tablet (40 mg total) by mouth daily.  90 tablet  0  . pramipexole (MIRAPEX) 0.25 MG tablet Take 1 tablet (0.25 mg total) by mouth at bedtime.  30 tablet  1  . sertraline (ZOLOFT) 100 MG tablet Take 1 tablet (100 mg total) by mouth 2 (two) times daily.  60 tablet  5  . simvastatin  (ZOCOR) 40 MG tablet Take 40 mg by mouth at bedtime.        . sucralfate (CARAFATE) 1 GM/10ML suspension Take 1 g by mouth 4 (four) times daily.        . traMADol (ULTRAM) 50 MG tablet Take 1 tablet (50 mg total) by mouth 2 (two) times daily as needed.  60 tablet  0  . triamcinolone (KENALOG) 0.1 % ointment Apply 1 application topically 2 (two) times daily.  30 g  3    Allergies  Allergen Reactions  . Morphine     REACTION: hallucinations    ROS As per HPI.  Also, no cough, no fever.  PE: Blood pressure 116/72, pulse 68, temperature 98 F (36.7 C), temperature source Oral, height 5\' 9"  (1.753 m), weight 189 lb (85.73 kg), SpO2 97.00%. Gen: Alert, well appearing.  Patient is oriented to person, place, time, and situation. Neck: supple, ROM full.  Carotids 2+ bilat, without bruit.  No lymphadenopathy, thyromegaly, or mass. Chest: symmetric expansion, nonlabored respirations.  Clear and equal breath sounds in all lung fields.   CV: RRR, no m/r/g.  Peripheral pulses 2+ and symmetric. ABD: soft, NT, ND, BS normal.  No hepatospenomegaly or mass.  No bruits. EXT: no clubbing or cyanosis.  Trace edema in both lower legs/ankles.  No significant varicose veins.  No rash.  DP/PT/radial pulses all 2+.    LABS:  12 lead EKG today showed RBBB: no change from prior EKGs 05/2011 and 12/2010.  IMPRESSION AND PLAN:  Chest pain Worrisome for progression of CAD/unstable angina. Discussed pt with Dr. Verl Blalock at [facility] cardiology today and he recommended increasing imdur to 90mg  daily, follow up with Dr. [name] next week. If he has any rest pain or nocturnal chest pain then he is to take sl ntg q24min x 3 and call 911 if incomplete response.  Edema Suspect mild venous insufficiency.  I really don't detect any significant edema today. No sign of heart failure/volume overload. Discussed sodium restriction, prn elevation of legs.     FOLLOW UP: Return in about 1 month (around 08/13/2011) for DM 2 f/u  (morning appt so fasting labs can be done at the  visit).

## 2011-07-14 ENCOUNTER — Encounter: Payer: Self-pay | Admitting: Cardiology

## 2011-07-15 ENCOUNTER — Encounter: Payer: Self-pay | Admitting: Cardiology

## 2011-07-15 DIAGNOSIS — I4510 Unspecified right bundle-branch block: Secondary | ICD-10-CM | POA: Insufficient documentation

## 2011-07-15 DIAGNOSIS — G2581 Restless legs syndrome: Secondary | ICD-10-CM | POA: Insufficient documentation

## 2011-07-15 DIAGNOSIS — R002 Palpitations: Secondary | ICD-10-CM | POA: Insufficient documentation

## 2011-07-15 DIAGNOSIS — R0602 Shortness of breath: Secondary | ICD-10-CM | POA: Insufficient documentation

## 2011-07-15 DIAGNOSIS — R296 Repeated falls: Secondary | ICD-10-CM | POA: Insufficient documentation

## 2011-07-15 DIAGNOSIS — R42 Dizziness and giddiness: Secondary | ICD-10-CM | POA: Insufficient documentation

## 2011-07-15 DIAGNOSIS — I2510 Atherosclerotic heart disease of native coronary artery without angina pectoris: Secondary | ICD-10-CM | POA: Insufficient documentation

## 2011-07-15 DIAGNOSIS — D649 Anemia, unspecified: Secondary | ICD-10-CM | POA: Insufficient documentation

## 2011-07-15 DIAGNOSIS — Z951 Presence of aortocoronary bypass graft: Secondary | ICD-10-CM | POA: Insufficient documentation

## 2011-07-15 DIAGNOSIS — R609 Edema, unspecified: Secondary | ICD-10-CM | POA: Insufficient documentation

## 2011-07-15 DIAGNOSIS — G4733 Obstructive sleep apnea (adult) (pediatric): Secondary | ICD-10-CM | POA: Insufficient documentation

## 2011-07-16 ENCOUNTER — Encounter: Payer: Self-pay | Admitting: Cardiology

## 2011-07-16 ENCOUNTER — Telehealth: Payer: Self-pay | Admitting: Internal Medicine

## 2011-07-16 ENCOUNTER — Ambulatory Visit (INDEPENDENT_AMBULATORY_CARE_PROVIDER_SITE_OTHER): Payer: 59 | Admitting: Cardiology

## 2011-07-16 ENCOUNTER — Ambulatory Visit (HOSPITAL_COMMUNITY): Payer: 59 | Attending: Cardiology | Admitting: Radiology

## 2011-07-16 VITALS — BP 132/76 | HR 62 | Ht 69.0 in | Wt 192.0 lb

## 2011-07-16 DIAGNOSIS — I10 Essential (primary) hypertension: Secondary | ICD-10-CM | POA: Insufficient documentation

## 2011-07-16 DIAGNOSIS — R002 Palpitations: Secondary | ICD-10-CM

## 2011-07-16 DIAGNOSIS — G4733 Obstructive sleep apnea (adult) (pediatric): Secondary | ICD-10-CM | POA: Insufficient documentation

## 2011-07-16 DIAGNOSIS — R0602 Shortness of breath: Secondary | ICD-10-CM

## 2011-07-16 DIAGNOSIS — R609 Edema, unspecified: Secondary | ICD-10-CM | POA: Insufficient documentation

## 2011-07-16 DIAGNOSIS — I079 Rheumatic tricuspid valve disease, unspecified: Secondary | ICD-10-CM | POA: Insufficient documentation

## 2011-07-16 DIAGNOSIS — E785 Hyperlipidemia, unspecified: Secondary | ICD-10-CM | POA: Insufficient documentation

## 2011-07-16 DIAGNOSIS — I4510 Unspecified right bundle-branch block: Secondary | ICD-10-CM | POA: Insufficient documentation

## 2011-07-16 DIAGNOSIS — I059 Rheumatic mitral valve disease, unspecified: Secondary | ICD-10-CM | POA: Insufficient documentation

## 2011-07-16 DIAGNOSIS — R0609 Other forms of dyspnea: Secondary | ICD-10-CM | POA: Insufficient documentation

## 2011-07-16 DIAGNOSIS — I0989 Other specified rheumatic heart diseases: Secondary | ICD-10-CM | POA: Insufficient documentation

## 2011-07-16 DIAGNOSIS — E119 Type 2 diabetes mellitus without complications: Secondary | ICD-10-CM | POA: Insufficient documentation

## 2011-07-16 DIAGNOSIS — R279 Unspecified lack of coordination: Secondary | ICD-10-CM

## 2011-07-16 DIAGNOSIS — E039 Hypothyroidism, unspecified: Secondary | ICD-10-CM

## 2011-07-16 DIAGNOSIS — R42 Dizziness and giddiness: Secondary | ICD-10-CM

## 2011-07-16 DIAGNOSIS — I2510 Atherosclerotic heart disease of native coronary artery without angina pectoris: Secondary | ICD-10-CM

## 2011-07-16 DIAGNOSIS — W19XXXA Unspecified fall, initial encounter: Secondary | ICD-10-CM

## 2011-07-16 DIAGNOSIS — D649 Anemia, unspecified: Secondary | ICD-10-CM

## 2011-07-16 DIAGNOSIS — R296 Repeated falls: Secondary | ICD-10-CM

## 2011-07-16 DIAGNOSIS — R0989 Other specified symptoms and signs involving the circulatory and respiratory systems: Secondary | ICD-10-CM | POA: Insufficient documentation

## 2011-07-16 LAB — CBC WITH DIFFERENTIAL/PLATELET
Basophils Absolute: 0 10*3/uL (ref 0.0–0.1)
Eosinophils Absolute: 0.2 10*3/uL (ref 0.0–0.7)
HCT: 41.3 % (ref 39.0–52.0)
Hemoglobin: 14.1 g/dL (ref 13.0–17.0)
Lymphs Abs: 1.9 10*3/uL (ref 0.7–4.0)
MCHC: 34.1 g/dL (ref 30.0–36.0)
MCV: 92.5 fl (ref 78.0–100.0)
Monocytes Absolute: 0.5 10*3/uL (ref 0.1–1.0)
Neutro Abs: 5.8 10*3/uL (ref 1.4–7.7)
RDW: 13.1 % (ref 11.5–14.6)

## 2011-07-16 LAB — BASIC METABOLIC PANEL
CO2: 25 mEq/L (ref 19–32)
Calcium: 9 mg/dL (ref 8.4–10.5)
Chloride: 105 mEq/L (ref 96–112)
Glucose, Bld: 257 mg/dL — ABNORMAL HIGH (ref 70–99)
Potassium: 4.2 mEq/L (ref 3.5–5.1)
Sodium: 140 mEq/L (ref 135–145)

## 2011-07-16 MED ORDER — FUROSEMIDE 20 MG PO TABS: 20.0000 mg | ORAL_TABLET | Freq: Every day | ORAL | Status: DC

## 2011-07-16 MED ORDER — METFORMIN HCL 1000 MG PO TABS: 1000.0000 mg | ORAL_TABLET | Freq: Two times a day (BID) | ORAL | Status: DC

## 2011-07-16 NOTE — Telephone Encounter (Signed)
Rx refill sent to pharmacy. 

## 2011-07-16 NOTE — Telephone Encounter (Signed)
Refill-metformin hcl 1000mg  tablet. Take one tablet twice a day. Qty 60. Last fill 5.22.12

## 2011-07-16 NOTE — Assessment & Plan Note (Signed)
He had a period of unexplained marked decrease in hemoglobin.  Hemoglobin will be checked today.

## 2011-07-16 NOTE — Assessment & Plan Note (Signed)
The primary complaint today is shortness of breath.  In the past his anginal equivalent has been chest discomfort.  I am not convinced that the current shortness of breath is angina.  The fact that he has some edema suggests that volume overload may be playing a role.  There is no EKG changes to suggest an MI since his last visit.  He does need a 2-D echo to reassess his LV function that has been normal in the past.  I will add a small dose of Lasix and check chemistry today.  I will then arrange for followup phone call to [place] to let us know how he is doing

## 2011-07-16 NOTE — Assessment & Plan Note (Signed)
He is not having any significant dizziness at this time.  No further workup.

## 2011-07-16 NOTE — Progress Notes (Signed)
HPI Patient is seen today for the assessment of shortness of breath.  He has been on for hours from [name] where he is staying to see me today.  He has known significant coronary disease.  I have reviewed his extensive records.  I have spent a prolonged period of time completely updating this EMR to be sure that reflects his prior evaluations.  Over time he has undergone multiple cardiac catheterizations.  He had a severe residual disease after his CABG and other interventions. He has had some shortness of breath in the past.  However this was limited and was not causing any significant problems recently.  He actually had a cardiopulmonary exercise test.  There is no question that he might have some chronotropic incompetence that his heart rate did reach 135.  More recently he noted that he has shortness of breath with limited walking.  He is not having any marked PND orthopnea.  He does feel that he has some swelling.  He has not had any significant edema recently.  There was some in the past. Allergies  Allergen Reactions  . Morphine     REACTION: hallucinations    Current Outpatient Prescriptions  Medication Sig Dispense Refill  . aspirin 81 MG tablet Take 81 mg by mouth daily.        Marland Kitchen BYSTOLIC 10 MG tablet TAKE 1 TABLET EVERY DAY  30 tablet  3  . clopidogrel (PLAVIX) 75 MG tablet Take 75 mg by mouth daily.        . [facility] IRON 325 (65 FE) MG tablet TAKE 1 TABLET BY MOUTH TWICE A DAY  60 tablet  2  . diclofenac sodium (VOLTAREN) 1 % GEL Apply 1 application topically 4 (four) times daily. Apply two times a day as needed  1 Tube  3  . fish oil-omega-3 fatty acids 1000 MG capsule Take 2 g by mouth 2 (two) times daily.        Marland Kitchen gabapentin (NEURONTIN) 300 MG capsule TAKE ONE CAPSULE BY MOUTH 3 TIMES A DAY  90 capsule  0  . glucose blood (FREESTYLE LITE) test strip 1 each by Other route. Use as instructed to test blood sugar three times daily       . insulin glargine (LANTUS) 100  UNIT/ML injection Sliding scale       . insulin lispro (HUMALOG KWIKPEN) 100 UNIT/ML injection As directed before meals and snacks up to 5 times daily.  15 mL  3  . Insulin Pen Needle (NOVOFINE) 30G X 8 MM MISC Inject 1 packet into the skin as needed. Use for insulin injections three times a day       . isosorbide mononitrate (IMDUR) 30 MG CR tablet Take 1 tablet (30 mg total) by mouth every morning.  30 tablet  5  . isosorbide mononitrate (IMDUR) 60 MG 24 hr tablet TAKE 1 & 1/2 TABLET BY MOUTH EVERY DAY  45 tablet  7  . levothyroxine (SYNTHROID, LEVOTHROID) 75 MCG tablet Take 1 tablet (75 mcg total) by mouth daily.  30 tablet  5  . metFORMIN (GLUCOPHAGE) 1000 MG tablet Take 1 tablet (1,000 mg total) by mouth 2 (two) times daily.  60 tablet  3  . nitroGLYCERIN (NITROSTAT) 0.4 MG SL tablet Place 0.4 mg under the tongue every 5 (five) minutes as needed. 1 tablet under tongue at onset of chest pain; may repeat every 5 minutes for up to 3 doses       . ondansetron (  ZOFRAN) 8 MG tablet Take by mouth every 12 (twelve) hours as needed.       . pantoprazole (PROTONIX) 40 MG tablet Take 1 tablet (40 mg total) by mouth daily.  90 tablet  0  . pramipexole (MIRAPEX) 0.25 MG tablet Take 1 tablet (0.25 mg total) by mouth at bedtime.  30 tablet  1  . sertraline (ZOLOFT) 100 MG tablet Take 1 tablet (100 mg total) by mouth 2 (two) times daily.  60 tablet  5  . simvastatin (ZOCOR) 40 MG tablet Take 40 mg by mouth at bedtime.        . sucralfate (CARAFATE) 1 GM/10ML suspension Take 1 g by mouth 4 (four) times daily.        . traMADol (ULTRAM) 50 MG tablet Take 1 tablet (50 mg total) by mouth 2 (two) times daily as needed.  60 tablet  0  . triamcinolone (KENALOG) 0.1 % ointment Apply 1 application topically 2 (two) times daily.  30 g  3    History   Social History  . Marital Status: Married    Spouse Name: N/A    Number of Children: N/A  . Years of Education: N/A   Occupational History  . Not on file.    Social History Main Topics  . Smoking status: Never Smoker   . Smokeless tobacco: Never Used  . Alcohol Use: No  . Drug Use: No  . Sexually Active: Not on file   Other Topics Concern  . Not on file   Social History Narrative  . No narrative on file    Family History  Problem Relation Age of Onset  . Cancer Brother     pancreatic cancer  . Diabetes Brother     4   . Stroke Brother   . Diabetes Brother   . Cancer Brother   . Diabetes Brother   . Coronary artery disease Brother   . Diabetes Brother   . Hypothyroidism Brother   . Heart attack      Nephew    Past Medical History  Diagnosis Date  . Diabetes mellitus type II, uncontrolled   . OSA (obstructive sleep apnea)   . Hyperlipidemia   . RBBB (right bundle branch block)   . Depression     Bipolar  . Edema   . Hypertension   . Falling episodes     these have occurred in the past and again recurring 2011  . Spondylosis     C5-6, C6-7 MRI 2010  . CAD (coronary artery disease)     status post coronary artery bypass graft in 2000,status post cardiac cath in 2006, 2009 ....continued chest pain and SOB despite oral medication adjestments including Ranexa.  Catheter November 2009/ DES  Stent...11/26/2008 to distal  RCA leading to acute marginal.  Cath 01/26/2010 continued patentcy grafts and stent to RCA , Modest progression of distal disease...medical Rx/   . Hx of CABG     2000,  / one median sternotomy suture broken her chest x-ray November, 2010, no clinical significance  . Ejection fraction     normal  . Palpitations     event recorder showed sinus rhythm  . Nephrolithiasis   . Restless leg   . Dizziness   . Shortness of breath     CPX April, 2011, mild functional limitation, no clear pulmonary or cardiac limitation, possible deconditioning and mild chronotropic incompetence( peak heart rate 130)  . Cerebral ischemia     MRI November, 2010,  chronic microvascular ischemia  . Anemia     hemoglobin 7.4, iron  deficiency, January, 2011, 2 unit transfusion, endoscopy normal, capsule endoscopy February, 2011 no small bowel abnormalities.   Most likely source gastric erosions, followed by GI    Past Surgical History  Procedure Date  . Nasal septum surgery     UP3  . Coronary artery bypass graft     2000    ROS  Patient denies fever, chills, headache, sweats, rash, change in vision, change in hearing, chest pain, cough, nausea vomiting, urinary symptoms.  All other systems are reviewed and are negative.  PHYSICAL EXAM Patient is oriented to person time and place.  Affect is normal.  Head is atraumatic.  There is no jugular venous distention.  Lungs reveal no definite rales.  There is no respiratory distress.  Cardiac exam reveals S1 and S2.  No clicks or significant murmurs.  The abdomen is soft.  He has trace peripheral edema in the right ankle.  There no musculoskeletal deformities.  No skin rashes. Filed Vitals:   07/16/11 1147  BP: 132/76  Pulse: 62  Height: 5\' 9"  (1.753 m)  Weight: 192 lb (87.091 kg)    EKG Is done today and reviewed by me.  There is normal sinus rhythm.  There is old right bundle branch block.  There is no significant change from the past.  ASSESSMENT & PLAN

## 2011-07-16 NOTE — Assessment & Plan Note (Signed)
TSH will be checked today to be sure that his thyroid is not playing a role in his overall symptoms at this time.

## 2011-07-16 NOTE — Assessment & Plan Note (Signed)
He has not been having any marked palpitations.  No further workup.

## 2011-07-16 NOTE — Assessment & Plan Note (Signed)
He has not had any recent falling spells.  No further workup.

## 2011-07-16 NOTE — Assessment & Plan Note (Signed)
Coronary disease currently is stable.  No further workup as of today.

## 2011-07-16 NOTE — Patient Instructions (Signed)
Your physician recommends that you schedule a follow-up appointment in: 3 months with Dr [name]. Your physician has recommended you make the following change in your medication: Start take Lasix 20 mg one tablet by mouth daily. Your Doctor wants for you to call Ubal[facility] Glassing in 3 to 4 days  For F/U of urine out put and if swelling in lower extremities are better.  Your physician recommends that you return for lab work in: CBC,BMET, TSH today. Your physician has requested that you have an echocardiogram. Echocardiography is a painless test that uses sound waves to create images of your heart. It provides your doctor with information about the size and shape of your heart and how well your heart's chambers and valves are working. This procedure takes approximately one hour. There are no restrictions for this procedure. Today.

## 2011-07-21 ENCOUNTER — Ambulatory Visit: Payer: 59 | Admitting: Internal Medicine

## 2011-07-23 ENCOUNTER — Encounter: Payer: Self-pay | Admitting: Family Medicine

## 2011-07-23 ENCOUNTER — Other Ambulatory Visit: Payer: Self-pay | Admitting: *Deleted

## 2011-07-23 NOTE — Telephone Encounter (Signed)
Last filled 01/29/11.

## 2011-07-23 NOTE — Telephone Encounter (Signed)
Error on pharmacy's part- this was last filled on 7/30, pt has 5 refills remaining.

## 2011-08-13 ENCOUNTER — Ambulatory Visit: Payer: 59 | Admitting: Family Medicine

## 2011-08-31 ENCOUNTER — Ambulatory Visit (INDEPENDENT_AMBULATORY_CARE_PROVIDER_SITE_OTHER): Payer: 59 | Admitting: Internal Medicine

## 2011-08-31 ENCOUNTER — Encounter: Payer: Self-pay | Admitting: Internal Medicine

## 2011-08-31 DIAGNOSIS — IMO0002 Reserved for concepts with insufficient information to code with codable children: Secondary | ICD-10-CM | POA: Insufficient documentation

## 2011-08-31 DIAGNOSIS — IMO0001 Reserved for inherently not codable concepts without codable children: Secondary | ICD-10-CM

## 2011-08-31 DIAGNOSIS — E039 Hypothyroidism, unspecified: Secondary | ICD-10-CM

## 2011-08-31 DIAGNOSIS — E785 Hyperlipidemia, unspecified: Secondary | ICD-10-CM

## 2011-08-31 DIAGNOSIS — E1165 Type 2 diabetes mellitus with hyperglycemia: Secondary | ICD-10-CM

## 2011-08-31 DIAGNOSIS — I10 Essential (primary) hypertension: Secondary | ICD-10-CM

## 2011-08-31 MED ORDER — SERTRALINE HCL 100 MG PO TABS: 100.0000 mg | ORAL_TABLET | Freq: Two times a day (BID) | ORAL | Status: DC

## 2011-08-31 MED ORDER — SIMVASTATIN 40 MG PO TABS: 40.0000 mg | ORAL_TABLET | Freq: Every day | ORAL | Status: DC

## 2011-08-31 MED ORDER — METFORMIN HCL 1000 MG PO TABS: 1000.0000 mg | ORAL_TABLET | Freq: Two times a day (BID) | ORAL | Status: DC

## 2011-08-31 MED ORDER — LEVOTHYROXINE SODIUM 75 MCG PO TABS: 75.0000 ug | ORAL_TABLET | Freq: Every day | ORAL | Status: DC

## 2011-08-31 MED ORDER — PRAMIPEXOLE DIHYDROCHLORIDE 0.25 MG PO TABS: 0.2500 mg | ORAL_TABLET | Freq: Every day | ORAL | Status: DC

## 2011-08-31 MED ORDER — INSULIN GLARGINE 100 UNIT/ML ~~LOC~~ SOLN: 40.0000 [IU] | Freq: Every day | SUBCUTANEOUS | Status: DC

## 2011-08-31 NOTE — Assessment & Plan Note (Signed)
Patient having financial issues. We may have to change Lantus and Humalog to NPH and regular insulin. Samples of insulin provided today.   Repeat A1c before next OV

## 2011-08-31 NOTE — Patient Instructions (Signed)
Please complete the following lab tests before your next follow up appointment:  BMET, A1c - 250.02 

## 2011-08-31 NOTE — Assessment & Plan Note (Signed)
Restart simvastatin.  Compliance encouraged.

## 2011-08-31 NOTE — Assessment & Plan Note (Signed)
Refilled levothyroxine. I encouraged compliance

## 2011-08-31 NOTE — Assessment & Plan Note (Signed)
Patient has not been taking any of his blood pressure medications Samples of bystolic provided.  Patient not clear on how much imdur to take. He was instructed to contact his cardiologist. Compliance strongly encouraged

## 2011-08-31 NOTE — Progress Notes (Signed)
Subjective:    Patient ID: [name], male    DOB: 12-25-1945, 65 y.o.   MRN: [id number]  HPI  65 year old white male with history of coronary artery disease, type 2 diabetes and hyperlipidemia for routine followup. Patient has been monitoring his blood sugar but they have been recently rising. He is having financial issues and not using insulin on a consistent basis.  He has also stopped using most of his other medications.  Fortunately his blood pressure has been fairly stable.   Review of Systems Negative for chest pain  Past Medical History  Diagnosis Date  . Diabetes mellitus type II, uncontrolled   . OSA (obstructive sleep apnea)   . Hyperlipidemia   . RBBB (right bundle branch block)   . Depression     Bipolar  . Edema   . Hypertension   . Falling episodes     these have occurred in the past and again recurring 2011  . Spondylosis     C5-6, C6-7 MRI 2010  . CAD (coronary artery disease)     status post coronary artery bypass graft in 2000,status post cardiac cath in 2006, 2009 ....continued chest pain and SOB despite oral medication adjestments including Ranexa.  Catheter November 2009/ DES  Stent...11/26/2008 to distal  RCA leading to acute marginal.  Cath 01/26/2010 continued patentcy grafts and stent to RCA , Modest progression of distal disease...medical Rx/   . Hx of CABG     2000,  / one median sternotomy suture broken her chest x-ray November, 2010, no clinical significance  . Ejection fraction     normal (echo 07/16/2011)  . Palpitations     event recorder showed sinus rhythm  . Nephrolithiasis   . Restless leg   . Dizziness   . Shortness of breath     CPX April, 2011, mild functional limitation, no clear pulmonary or cardiac limitation, possible deconditioning and mild chronotropic incompetence( peak heart rate 130)  . Cerebral ischemia     MRI November, 2010, chronic microvascular ischemia  . Anemia     hemoglobin 7.4, iron deficiency, January, 2011,  2 unit transfusion, endoscopy normal, capsule endoscopy February, 2011 no small bowel abnormalities.   Most likely source gastric erosions, followed by GI    History   Social History  . Marital Status: Married    Spouse Name: N/A    Number of Children: N/A  . Years of Education: N/A   Occupational History  . Not on file.   Social History Main Topics  . Smoking status: Never Smoker   . Smokeless tobacco: Never Used  . Alcohol Use: No  . Drug Use: No  . Sexually Active: Not on file   Other Topics Concern  . Not on file   Social History Narrative  . No narrative on file    Past Surgical History  Procedure Date  . Nasal septum surgery     UP3  . Coronary artery bypass graft     2000    Family History  Problem Relation Age of Onset  . Cancer Brother     pancreatic cancer  . Diabetes Brother     4   . Stroke Brother   . Diabetes Brother   . Cancer Brother   . Diabetes Brother   . Coronary artery disease Brother   . Diabetes Brother   . Hypothyroidism Brother   . Heart attack      Nephew    Allergies  Allergen Reactions  .  Morphine     REACTION: hallucinations    Current Outpatient Prescriptions on File Prior to Visit  Medication Sig Dispense Refill  . aspirin 81 MG tablet Take 81 mg by mouth daily.        Marland Kitchen BYSTOLIC 10 MG tablet TAKE 1 TABLET EVERY DAY  30 tablet  3  . clopidogrel (PLAVIX) 75 MG tablet Take 75 mg by mouth daily.        . [facility] IRON 325 (65 FE) MG tablet TAKE 1 TABLET BY MOUTH TWICE A DAY  60 tablet  2  . diclofenac sodium (VOLTAREN) 1 % GEL Apply 1 application topically 4 (four) times daily. Apply two times a day as needed  1 Tube  3  . fish oil-omega-3 fatty acids 1000 MG capsule Take 2 g by mouth 2 (two) times daily.        . furosemide (LASIX) 20 MG tablet Take 1 tablet (20 mg total) by mouth daily.  30 tablet  11  . gabapentin (NEURONTIN) 300 MG capsule TAKE ONE CAPSULE BY MOUTH 3 TIMES A DAY  90 capsule  0  . glucose blood (FREESTYLE  LITE) test strip 1 each by Other route. Use as instructed to test blood sugar three times daily       . insulin lispro (HUMALOG KWIKPEN) 100 UNIT/ML injection As directed before meals and snacks up to 5 times daily.  15 mL  3  . Insulin Pen Needle (NOVOFINE) 30G X 8 MM MISC Inject 1 packet into the skin as needed. Use for insulin injections three times a day       . isosorbide mononitrate (IMDUR) 30 MG CR tablet Take 1 tablet (30 mg total) by mouth every morning.  30 tablet  5  . isosorbide mononitrate (IMDUR) 60 MG 24 hr tablet TAKE 1 & 1/2 TABLET BY MOUTH EVERY DAY  45 tablet  7  . nitroGLYCERIN (NITROSTAT) 0.4 MG SL tablet Place 0.4 mg under the tongue every 5 (five) minutes as needed. 1 tablet under tongue at onset of chest pain; may repeat every 5 minutes for up to 3 doses       . ondansetron (ZOFRAN) 8 MG tablet Take by mouth every 12 (twelve) hours as needed.       . pantoprazole (PROTONIX) 40 MG tablet Take 1 tablet (40 mg total) by mouth daily.  90 tablet  0  . sucralfate (CARAFATE) 1 GM/10ML suspension Take 1 g by mouth 4 (four) times daily.        . traMADol (ULTRAM) 50 MG tablet Take 1 tablet (50 mg total) by mouth 2 (two) times daily as needed.  60 tablet  0  . triamcinolone (KENALOG) 0.1 % ointment Apply 1 application topically 2 (two) times daily.  30 g  3    BP 134/82  Pulse 76  Temp(Src) 98.1 F (36.7 C) (Oral)  Wt 182 lb (82.555 kg)       Objective:   Physical Exam   Constitutional: Appears well-developed and well-nourished. No distress.  Neck: Normal range of motion. Neck supple. No thyromegaly present. No carotid bruit Cardiovascular: Normal rate, regular rhythm and normal heart sounds.  Exam reveals no gallop and no friction rub.   No murmur heard. Pulmonary/Chest: Effort normal and breath sounds normal.  No wheezes. No rales.  Neurological: Alert. No cranial nerve deficit.  Skin: Skin is warm and dry.  Psychiatric: Normal mood and affect. Behavior is normal.  Assessment & Plan:

## 2011-09-10 LAB — GLUCOSE, CAPILLARY
Glucose-Capillary: 182 mg/dL — ABNORMAL HIGH (ref 70–99)
Glucose-Capillary: 184 mg/dL — ABNORMAL HIGH (ref 70–99)
Glucose-Capillary: 250 mg/dL — ABNORMAL HIGH (ref 70–99)
Glucose-Capillary: 340 mg/dL — ABNORMAL HIGH (ref 70–99)
Glucose-Capillary: 380 mg/dL — ABNORMAL HIGH (ref 70–99)

## 2011-09-10 LAB — BASIC METABOLIC PANEL
BUN: 11 mg/dL (ref 6–23)
CO2: 25 mEq/L (ref 19–32)
Calcium: 8.8 mg/dL (ref 8.4–10.5)
Glucose, Bld: 242 mg/dL — ABNORMAL HIGH (ref 70–99)
Sodium: 138 mEq/L (ref 135–145)

## 2011-09-10 LAB — CBC
Hemoglobin: 14.4 g/dL (ref 13.0–17.0)
MCHC: 34.5 g/dL (ref 30.0–36.0)
Platelets: 149 10*3/uL — ABNORMAL LOW (ref 150–400)
RDW: 12.4 % (ref 11.5–15.5)

## 2011-09-17 ENCOUNTER — Telehealth: Payer: Self-pay | Admitting: Internal Medicine

## 2011-09-17 ENCOUNTER — Other Ambulatory Visit: Payer: Self-pay | Admitting: Cardiology

## 2011-09-17 MED ORDER — FERROUS SULFATE 325 (65 FE) MG PO TABS: 325.0000 mg | ORAL_TABLET | Freq: Two times a day (BID) | ORAL | Status: DC

## 2011-09-17 NOTE — Telephone Encounter (Signed)
rx sent in electronically 

## 2011-09-17 NOTE — Telephone Encounter (Signed)
Refill- [facility] iron 65mg  tablet. Take one tablet by mouth twice a day. Qty 60. Last fill 8.3.12

## 2011-09-23 ENCOUNTER — Other Ambulatory Visit: Payer: 59

## 2011-09-24 ENCOUNTER — Other Ambulatory Visit (INDEPENDENT_AMBULATORY_CARE_PROVIDER_SITE_OTHER): Payer: 59

## 2011-09-24 DIAGNOSIS — E119 Type 2 diabetes mellitus without complications: Secondary | ICD-10-CM

## 2011-09-24 LAB — BASIC METABOLIC PANEL WITH GFR
BUN: 18 mg/dL (ref 6–23)
CO2: 27 meq/L (ref 19–32)
Calcium: 9 mg/dL (ref 8.4–10.5)
Chloride: 106 meq/L (ref 96–112)
Creatinine, Ser: 1 mg/dL (ref 0.4–1.5)
GFR: 76.65 mL/min
Glucose, Bld: 141 mg/dL — ABNORMAL HIGH (ref 70–99)
Potassium: 4.2 meq/L (ref 3.5–5.1)
Sodium: 142 meq/L (ref 135–145)

## 2011-09-24 LAB — HEMOGLOBIN A1C: Hgb A1c MFr Bld: 8.4 % — ABNORMAL HIGH (ref 4.6–6.5)

## 2011-09-30 ENCOUNTER — Ambulatory Visit: Payer: 59 | Admitting: Internal Medicine

## 2011-10-06 ENCOUNTER — Ambulatory Visit (INDEPENDENT_AMBULATORY_CARE_PROVIDER_SITE_OTHER): Payer: 59 | Admitting: Internal Medicine

## 2011-10-06 ENCOUNTER — Encounter: Payer: Self-pay | Admitting: Internal Medicine

## 2011-10-06 DIAGNOSIS — IMO0002 Reserved for concepts with insufficient information to code with codable children: Secondary | ICD-10-CM

## 2011-10-06 DIAGNOSIS — IMO0001 Reserved for inherently not codable concepts without codable children: Secondary | ICD-10-CM

## 2011-10-06 DIAGNOSIS — E1165 Type 2 diabetes mellitus with hyperglycemia: Secondary | ICD-10-CM

## 2011-10-06 DIAGNOSIS — I10 Essential (primary) hypertension: Secondary | ICD-10-CM

## 2011-10-06 NOTE — Assessment & Plan Note (Signed)
Improved. Patient advised to notify office if he is having financial difficulty obtaining his insulin. We discussed potentially switching to NPH and regular insulin. Dietary compliance and insulin use strongly encouraged. Lab Results  Component Value Date   HGBA1C 8.4* 09/24/2011

## 2011-10-06 NOTE — Progress Notes (Signed)
Subjective:    Patient ID: [name] [name], male    DOB: 02/27/1946, 65 y.o.   MRN: [id number]  HPI   65 year old white male with history of type 2 diabetes, hypertension and coronary artery disease for followup. Since previous visit his compliance with medications has significantly improved. He has been using his insulin as directed. He denies any hypoglycemic episodes. His blood pressure is also significantly improved.    Review of Systems     negative for chest pain   Past Medical History  Diagnosis Date  . Diabetes mellitus type II, uncontrolled   . OSA (obstructive sleep apnea)   . Hyperlipidemia   . RBBB (right bundle branch block)   . Depression     Bipolar  . Edema   . Hypertension   . Falling episodes     these have occurred in the past and again recurring 2011  . Spondylosis     C5-6, C6-7 MRI 2010  . CAD (coronary artery disease)     status post coronary artery bypass graft in 2000,status post cardiac cath in 2006, 2009 ....continued chest pain and SOB despite oral medication adjestments including Ranexa.  Catheter November 2009/ DES  Stent...11/26/2008 to distal  RCA leading to acute marginal.  Cath 01/26/2010 continued patentcy grafts and stent to RCA , Modest progression of distal disease...medical Rx/   . Hx of CABG     2000,  / one median sternotomy suture broken her chest x-ray November, 2010, no clinical significance  . Ejection fraction     normal (echo 07/16/2011)  . Palpitations     event recorder showed sinus rhythm  . Nephrolithiasis   . Restless leg   . Dizziness   . Shortness of breath     CPX April, 2011, mild functional limitation, no clear pulmonary or cardiac limitation, possible deconditioning and mild chronotropic incompetence( peak heart rate 130)  . Cerebral ischemia     MRI November, 2010, chronic microvascular ischemia  . Anemia     hemoglobin 7.4, iron deficiency, January, 2011, 2 unit transfusion, endoscopy normal, capsule endoscopy  February, 2011 no small bowel abnormalities.   Most likely source gastric erosions, followed by GI    History   Social History  . Marital Status: Married    Spouse Name: N/A    Number of Children: N/A  . Years of Education: N/A   Occupational History  . Not on file.   Social History Main Topics  . Smoking status: Never Smoker   . Smokeless tobacco: Never Used  . Alcohol Use: No  . Drug Use: No  . Sexually Active: Not on file   Other Topics Concern  . Not on file   Social History Narrative  . No narrative on file    Past Surgical History  Procedure Date  . Nasal septum surgery     UP3  . Coronary artery bypass graft     2000    Family History  Problem Relation Age of Onset  . Cancer Brother     pancreatic cancer  . Diabetes Brother     4   . Stroke Brother   . Diabetes Brother   . Cancer Brother   . Diabetes Brother   . Coronary artery disease Brother   . Diabetes Brother   . Hypothyroidism Brother   . Heart attack      Nephew    Allergies  Allergen Reactions  . Morphine     REACTION: hallucinations  Current Outpatient Prescriptions on File Prior to Visit  Medication Sig Dispense Refill  . aspirin 81 MG tablet Take 81 mg by mouth daily.        Marland Kitchen BYSTOLIC 10 MG tablet TAKE 1 TABLET EVERY DAY  30 tablet  3  . clopidogrel (PLAVIX) 75 MG tablet TAKE 1 TABLET EVERY DAY  32 tablet  12  . diclofenac sodium (VOLTAREN) 1 % GEL Apply 1 application topically 4 (four) times daily. Apply two times a day as needed  1 Tube  3  . ferrous sulfate ([employer] IRON) 325 (65 FE) MG tablet Take 1 tablet (325 mg total) by mouth 2 (two) times daily.  60 tablet  2  . fish oil-omega-3 fatty acids 1000 MG capsule Take 2 g by mouth 2 (two) times daily.        . furosemide (LASIX) 20 MG tablet Take 1 tablet (20 mg total) by mouth daily.  30 tablet  11  . gabapentin (NEURONTIN) 300 MG capsule TAKE ONE CAPSULE BY MOUTH 3 TIMES A DAY  90 capsule  0  . glucose blood (FREESTYLE LITE)  test strip 1 each by Other route. Use as instructed to test blood sugar three times daily       . insulin glargine (LANTUS) 100 UNIT/ML injection Inject 40 Units into the skin at bedtime.  10 mL  3  . insulin lispro (HUMALOG KWIKPEN) 100 UNIT/ML injection As directed before meals and snacks up to 5 times daily.  15 mL  3  . Insulin Pen Needle (NOVOFINE) 30G X 8 MM MISC Inject 1 packet into the skin as needed. Use for insulin injections three times a day       . isosorbide mononitrate (IMDUR) 30 MG CR tablet Take 1 tablet (30 mg total) by mouth every morning.  30 tablet  5  . isosorbide mononitrate (IMDUR) 60 MG 24 hr tablet TAKE 1 & 1/2 TABLET BY MOUTH EVERY DAY  45 tablet  7  . levothyroxine (SYNTHROID, LEVOTHROID) 75 MCG tablet Take 1 tablet (75 mcg total) by mouth daily.  90 tablet  0  . metFORMIN (GLUCOPHAGE) 1000 MG tablet Take 1 tablet (1,000 mg total) by mouth 2 (two) times daily.  180 tablet  0  . NITROSTAT 0.4 MG SL tablet 1 TABLET UNDER TONGUE AT ONSET OF CHEST PAIN YOU MAY REPEAT EVERY 5 MINUTES FOR UP TO 3 DOSES.  25 tablet  7  . ondansetron (ZOFRAN) 8 MG tablet Take by mouth every 12 (twelve) hours as needed.       . pantoprazole (PROTONIX) 40 MG tablet Take 1 tablet (40 mg total) by mouth daily.  90 tablet  0  . pramipexole (MIRAPEX) 0.25 MG tablet Take 1 tablet (0.25 mg total) by mouth at bedtime.  90 tablet  0  . sertraline (ZOLOFT) 100 MG tablet Take 1 tablet (100 mg total) by mouth 2 (two) times daily.  180 tablet  0  . simvastatin (ZOCOR) 40 MG tablet Take 1 tablet (40 mg total) by mouth at bedtime.  90 tablet  0  . sucralfate (CARAFATE) 1 GM/10ML suspension Take 1 g by mouth 4 (four) times daily.        . traMADol (ULTRAM) 50 MG tablet Take 1 tablet (50 mg total) by mouth 2 (two) times daily as needed.  60 tablet  0  . triamcinolone (KENALOG) 0.1 % ointment Apply 1 application topically 2 (two) times daily.  30 g  3  BP 116/64  Pulse 68  Temp(Src) 98.3 F (36.8 C) (Oral)   Wt 185 lb (83.915 kg)    Objective:   Physical Exam   Constitutional: Appears well-developed and well-nourished. No distress.  Neck: Normal range of motion. Neck supple. No thyromegaly present. No carotid bruit Cardiovascular: Normal rate, regular rhythm and normal heart sounds.  Exam reveals no gallop and no friction rub.  No murmur heard. Pulmonary/Chest: Effort normal and breath sounds normal.  No wheezes. No rales.  Abdominal: Soft. Bowel sounds are normal. No mass. There is no tenderness.  Neurological: Alert. No cranial nerve deficit.  Skin: Skin is warm and dry.  Psychiatric: Normal mood and affect. Behavior is normal.      Assessment & Plan:

## 2011-10-06 NOTE — Patient Instructions (Signed)
Please complete the following lab tests before your next follow up appointment: Bmet, a1c - 250.02

## 2011-10-15 ENCOUNTER — Ambulatory Visit: Payer: 59 | Admitting: Cardiology

## 2011-10-19 ENCOUNTER — Other Ambulatory Visit: Payer: Self-pay

## 2011-10-19 ENCOUNTER — Encounter (HOSPITAL_COMMUNITY): Payer: Self-pay | Admitting: *Deleted

## 2011-10-19 ENCOUNTER — Inpatient Hospital Stay (HOSPITAL_COMMUNITY)
Admission: EM | Admit: 2011-10-19 | Discharge: 2011-10-20 | DRG: 303 | Disposition: A | Discharge status: Home / Self Care | Payer: 59 | Source: Ambulatory Visit | Attending: Internal Medicine | Admitting: Internal Medicine

## 2011-10-19 ENCOUNTER — Emergency Department (HOSPITAL_COMMUNITY): Payer: 59

## 2011-10-19 DIAGNOSIS — I10 Essential (primary) hypertension: Secondary | ICD-10-CM | POA: Diagnosis present

## 2011-10-19 DIAGNOSIS — I4510 Unspecified right bundle-branch block: Secondary | ICD-10-CM | POA: Diagnosis present

## 2011-10-19 DIAGNOSIS — Z23 Encounter for immunization: Secondary | ICD-10-CM

## 2011-10-19 DIAGNOSIS — I200 Unstable angina: Secondary | ICD-10-CM | POA: Diagnosis present

## 2011-10-19 DIAGNOSIS — Z951 Presence of aortocoronary bypass graft: Secondary | ICD-10-CM

## 2011-10-19 DIAGNOSIS — Z7902 Long term (current) use of antithrombotics/antiplatelets: Secondary | ICD-10-CM

## 2011-10-19 DIAGNOSIS — I2510 Atherosclerotic heart disease of native coronary artery without angina pectoris: Secondary | ICD-10-CM | POA: Diagnosis present

## 2011-10-19 DIAGNOSIS — Z9861 Coronary angioplasty status: Secondary | ICD-10-CM

## 2011-10-19 DIAGNOSIS — G4733 Obstructive sleep apnea (adult) (pediatric): Secondary | ICD-10-CM | POA: Diagnosis present

## 2011-10-19 DIAGNOSIS — Z8249 Family history of ischemic heart disease and other diseases of the circulatory system: Secondary | ICD-10-CM

## 2011-10-19 DIAGNOSIS — M47812 Spondylosis without myelopathy or radiculopathy, cervical region: Secondary | ICD-10-CM | POA: Diagnosis present

## 2011-10-19 DIAGNOSIS — F319 Bipolar disorder, unspecified: Secondary | ICD-10-CM | POA: Diagnosis present

## 2011-10-19 DIAGNOSIS — R079 Chest pain, unspecified: Secondary | ICD-10-CM

## 2011-10-19 DIAGNOSIS — G2581 Restless legs syndrome: Secondary | ICD-10-CM | POA: Diagnosis present

## 2011-10-19 DIAGNOSIS — E785 Hyperlipidemia, unspecified: Secondary | ICD-10-CM | POA: Diagnosis present

## 2011-10-19 DIAGNOSIS — Z833 Family history of diabetes mellitus: Secondary | ICD-10-CM

## 2011-10-19 DIAGNOSIS — Z794 Long term (current) use of insulin: Secondary | ICD-10-CM

## 2011-10-19 DIAGNOSIS — Z79899 Other long term (current) drug therapy: Secondary | ICD-10-CM

## 2011-10-19 DIAGNOSIS — E119 Type 2 diabetes mellitus without complications: Secondary | ICD-10-CM | POA: Diagnosis present

## 2011-10-19 DIAGNOSIS — Z7982 Long term (current) use of aspirin: Secondary | ICD-10-CM

## 2011-10-19 MED ORDER — NITROGLYCERIN IN D5W 200-5 MCG/ML-% IV SOLN
3.0000 ug/min | Freq: Once | INTRAVENOUS | Status: AC
  Administered 2011-10-19: 3 ug/min via INTRAVENOUS
  Filled 2011-10-19: qty 250

## 2011-10-19 MED ORDER — ONDANSETRON HCL 4 MG/2ML IJ SOLN
INTRAMUSCULAR | Status: AC
  Filled 2011-10-19: qty 2

## 2011-10-19 NOTE — ED Notes (Signed)
CBG 139 via EMS

## 2011-10-19 NOTE — ED Notes (Signed)
See triage note.  Pt denies CP at this time, confirms all information provided by EMS.  Pt denies nausea.  Pt is tender on palpation of his midchest.   Skin warm, dry and intact.  Neuro intact.  Denies HA.  famiyl at bedside.

## 2011-10-19 NOTE — ED Notes (Signed)
Pt started to have Chest tightness, nausea, vomiting earlier today.  Pt took 1SL nitro with relief.  Had more intermittent CP tonight with diaphoresis, nausea, vomiting, radiation to (L) jaw.  Pt went to medic station, got 1SL nitro, 324 asa, 4mg  zofran (L-hand, 20ga).  Since that time, no CP, nausea, diaphoresis.  Pt tender to palpation.  A/O x 3.  Noted to be in a 1degree HB, RBB.

## 2011-10-20 ENCOUNTER — Encounter (HOSPITAL_COMMUNITY): Payer: Self-pay | Admitting: *Deleted

## 2011-10-20 ENCOUNTER — Other Ambulatory Visit: Payer: Self-pay

## 2011-10-20 DIAGNOSIS — R079 Chest pain, unspecified: Secondary | ICD-10-CM

## 2011-10-20 LAB — CBC
HCT: 39.4 % (ref 39.0–52.0)
HCT: 40.2 % (ref 39.0–52.0)
MCHC: 36.3 g/dL — ABNORMAL HIGH (ref 30.0–36.0)
MCV: 86.6 fL (ref 78.0–100.0)
MCV: 87.6 fL (ref 78.0–100.0)
RDW: 13 % (ref 11.5–15.5)
RDW: 13.2 % (ref 11.5–15.5)
WBC: 7.7 10*3/uL (ref 4.0–10.5)

## 2011-10-20 LAB — COMPREHENSIVE METABOLIC PANEL
AST: 18 U/L (ref 0–37)
Albumin: 4.1 g/dL (ref 3.5–5.2)
Calcium: 9.6 mg/dL (ref 8.4–10.5)
Creatinine, Ser: 0.84 mg/dL (ref 0.50–1.35)
GFR calc non Af Amer: >90 mL/min (ref >90)
Total Protein: 7.1 g/dL (ref 6.0–8.3)

## 2011-10-20 LAB — BASIC METABOLIC PANEL
BUN: 8 mg/dL (ref 6–23)
CO2: 25 mEq/L (ref 19–32)
Chloride: 107 mEq/L (ref 96–112)
Creatinine, Ser: 0.84 mg/dL (ref 0.50–1.35)
GFR calc Af Amer: >90 mL/min (ref >90)

## 2011-10-20 LAB — LIPID PANEL
Total CHOL/HDL Ratio: 4.3 RATIO
VLDL: 25 mg/dL (ref 0–40)

## 2011-10-20 LAB — PROTIME-INR
INR: 1 (ref 0.00–1.49)
Prothrombin Time: 13.4 seconds (ref 11.6–15.2)

## 2011-10-20 LAB — CARDIAC PANEL(CRET KIN+CKTOT+MB+TROPI)
CK, MB: 2 ng/mL (ref 0.3–4.0)
Total CK: 41 U/L (ref 7–232)
Total CK: 41 U/L (ref 7–232)
Troponin I: <0.3 ng/mL (ref <0.30)

## 2011-10-20 LAB — POCT I-STAT TROPONIN I

## 2011-10-20 LAB — DIFFERENTIAL
Basophils Absolute: 0 10*3/uL (ref 0.0–0.1)
Basophils Relative: 0 % (ref 0–1)
Eosinophils Relative: 1 % (ref 0–5)
Monocytes Absolute: 0.7 10*3/uL (ref 0.1–1.0)

## 2011-10-20 LAB — GLUCOSE, CAPILLARY: Glucose-Capillary: 128 mg/dL — ABNORMAL HIGH (ref 70–99)

## 2011-10-20 LAB — TSH: TSH: 2.85 u[IU]/mL (ref 0.350–4.500)

## 2011-10-20 MED ORDER — HEPARIN (PORCINE) IN NACL 100-0.45 UNIT/ML-% IJ SOLN
1400.0000 [IU]/h | INTRAMUSCULAR | Status: DC
  Filled 2011-10-20: qty 250

## 2011-10-20 MED ORDER — NEBIVOLOL HCL 10 MG PO TABS
10.0000 mg | ORAL_TABLET | Freq: Every day | ORAL | Status: DC
Start: 2011-10-20 — End: 2011-10-20
  Administered 2011-10-20: 10 mg via ORAL
  Filled 2011-10-20: qty 1

## 2011-10-20 MED ORDER — ASPIRIN 300 MG RE SUPP
300.0000 mg | RECTAL | Status: DC
  Filled 2011-10-20: qty 1

## 2011-10-20 MED ORDER — OMEGA-3-ACID ETHYL ESTERS 1 G PO CAPS
2.0000 g | ORAL_CAPSULE | Freq: Two times a day (BID) | ORAL | Status: DC
  Administered 2011-10-20: 2 g via ORAL
  Filled 2011-10-20 (×2): qty 2

## 2011-10-20 MED ORDER — INSULIN GLARGINE 100 UNIT/ML ~~LOC~~ SOLN
40.0000 [IU] | Freq: Every day | SUBCUTANEOUS | Status: DC
  Filled 2011-10-20: qty 3

## 2011-10-20 MED ORDER — INSULIN ASPART 100 UNIT/ML ~~LOC~~ SOLN
0.0000 [IU] | Freq: Three times a day (TID) | SUBCUTANEOUS | Status: DC
  Administered 2011-10-20: 2 [IU] via SUBCUTANEOUS
  Filled 2011-10-20: qty 3

## 2011-10-20 MED ORDER — ISOSORBIDE MONONITRATE ER 60 MG PO TB24: 90.0000 mg | ORAL_TABLET | Freq: Every day | ORAL | Status: DC

## 2011-10-20 MED ORDER — NITROGLYCERIN 0.4 MG SL SUBL: 0.4000 mg | SUBLINGUAL_TABLET | SUBLINGUAL | Status: DC | PRN

## 2011-10-20 MED ORDER — HEPARIN (PORCINE) IN NACL 100-0.45 UNIT/ML-% IJ SOLN
1100.0000 [IU]/h | INTRAMUSCULAR | Status: DC
  Filled 2011-10-20: qty 250

## 2011-10-20 MED ORDER — ISOSORBIDE MONONITRATE ER 30 MG PO TB24: 30.0000 mg | ORAL_TABLET | ORAL | Status: DC

## 2011-10-20 MED ORDER — OMEGA-3 FATTY ACIDS 1000 MG PO CAPS: 2.0000 g | ORAL_CAPSULE | Freq: Two times a day (BID) | ORAL | Status: DC

## 2011-10-20 MED ORDER — ASPIRIN 81 MG PO CHEW: 324.0000 mg | CHEWABLE_TABLET | ORAL | Status: DC

## 2011-10-20 MED ORDER — ONDANSETRON HCL 4 MG/2ML IJ SOLN: 4.0000 mg | Freq: Four times a day (QID) | INTRAMUSCULAR | Status: DC | PRN

## 2011-10-20 MED ORDER — FERROUS SULFATE 325 (65 FE) MG PO TABS
325.0000 mg | ORAL_TABLET | Freq: Two times a day (BID) | ORAL | Status: DC
  Administered 2011-10-20: 325 mg via ORAL
  Filled 2011-10-20 (×2): qty 1

## 2011-10-20 MED ORDER — ISOSORBIDE MONONITRATE ER 60 MG PO TB24
120.0000 mg | ORAL_TABLET | Freq: Every day | ORAL | Status: DC
  Administered 2011-10-20: 120 mg via ORAL
  Filled 2011-10-20: qty 2

## 2011-10-20 MED ORDER — SODIUM CHLORIDE 0.9 % IV SOLN
INTRAVENOUS | Status: DC
  Administered 2011-10-20: 05:00:00 via INTRAVENOUS

## 2011-10-20 MED ORDER — INFLUENZA VIRUS VACC SPLIT PF IM SUSP
0.5000 mL | INTRAMUSCULAR | Status: DC
  Filled 2011-10-20 (×2): qty 0.5

## 2011-10-20 MED ORDER — LEVOTHYROXINE SODIUM 75 MCG PO TABS
75.0000 ug | ORAL_TABLET | Freq: Every day | ORAL | Status: DC
  Administered 2011-10-20: 75 ug via ORAL
  Filled 2011-10-20: qty 1

## 2011-10-20 MED ORDER — SIMVASTATIN 40 MG PO TABS
40.0000 mg | ORAL_TABLET | Freq: Every day | ORAL | Status: DC
  Filled 2011-10-20: qty 1

## 2011-10-20 MED ORDER — HEPARIN (PORCINE) IN NACL 100-0.45 UNIT/ML-% IJ SOLN
1100.0000 [IU]/h | INTRAMUSCULAR | Status: DC
  Administered 2011-10-20: 1100 [IU]/h via INTRAVENOUS
  Filled 2011-10-20: qty 250

## 2011-10-20 MED ORDER — CLOPIDOGREL BISULFATE 75 MG PO TABS
75.0000 mg | ORAL_TABLET | Freq: Every day | ORAL | Status: DC
  Administered 2011-10-20: 75 mg via ORAL
  Filled 2011-10-20: qty 1

## 2011-10-20 MED ORDER — PANTOPRAZOLE SODIUM 40 MG PO TBEC
40.0000 mg | DELAYED_RELEASE_TABLET | Freq: Every day | ORAL | Status: DC
  Administered 2011-10-20: 40 mg via ORAL
  Filled 2011-10-20: qty 1

## 2011-10-20 MED ORDER — ACETAMINOPHEN 325 MG PO TABS: 650.0000 mg | ORAL_TABLET | ORAL | Status: DC | PRN

## 2011-10-20 MED ORDER — GABAPENTIN 300 MG PO CAPS
300.0000 mg | ORAL_CAPSULE | Freq: Three times a day (TID) | ORAL | Status: DC
  Administered 2011-10-20: 300 mg via ORAL
  Filled 2011-10-20 (×3): qty 1

## 2011-10-20 MED ORDER — INSULIN ASPART 100 UNIT/ML ~~LOC~~ SOLN: 0.0000 [IU] | Freq: Every day | SUBCUTANEOUS | Status: DC

## 2011-10-20 MED ORDER — SERTRALINE HCL 100 MG PO TABS
100.0000 mg | ORAL_TABLET | Freq: Two times a day (BID) | ORAL | Status: DC
Start: 2011-10-20 — End: 2011-10-20
  Administered 2011-10-20: 100 mg via ORAL
  Filled 2011-10-20 (×2): qty 1

## 2011-10-20 MED ORDER — FUROSEMIDE 20 MG PO TABS
20.0000 mg | ORAL_TABLET | Freq: Every day | ORAL | Status: DC
  Administered 2011-10-20: 20 mg via ORAL
  Filled 2011-10-20: qty 1

## 2011-10-20 MED ORDER — PRAMIPEXOLE DIHYDROCHLORIDE 0.25 MG PO TABS
0.2500 mg | ORAL_TABLET | Freq: Two times a day (BID) | ORAL | Status: DC
  Administered 2011-10-20: 0.25 mg via ORAL
  Filled 2011-10-20 (×2): qty 1

## 2011-10-20 MED ORDER — HEPARIN BOLUS VIA INFUSION
2500.0000 [IU] | Freq: Once | INTRAVENOUS | Status: AC
  Administered 2011-10-20: 2500 [IU] via INTRAVENOUS

## 2011-10-20 MED ORDER — ASPIRIN EC 325 MG PO TBEC: 325.0000 mg | DELAYED_RELEASE_TABLET | Freq: Every day | ORAL | Status: DC

## 2011-10-20 NOTE — Discharge Summary (Signed)
Patient ID: [name] [name],  MRN: [id number], DOB/AGE: 63-Jan-1947 63 y.o.  Admit date: 10/19/2011 Discharge date: 10/20/2011  Primary MD: Dr. Shawna Orleans Primary Cardiologist: [name], JEFFREY [name]  Discharge Diagnoses: Principal Problem:  *Chest pain Active Problems:  CAD (coronary artery disease)  Allergies Allergies  Allergen Reactions  . Morphine     REACTION: hallucinations   Procedures: None  History of Present Illness: Mr. Beichner is a 65 y.o.male with CAD s/p CABG (3 SVG and LIMA to LAD) in 2000 and s/p PCI to native RCA with most recent LHC in 01/2010 showing patent grafts and distal disease who presented to [facility] on 10/19/11 with two episodes of chest pressure. The second episode was severe, lasted 1hr and occurred at rest with assoicated SOB and nausea. It was relieved with SL nitroglycerin.  Hospital Course: In the ED his EKG was unchanged and showed NSR w/ RBBB w/o ST/T changes. His POC Troponin I was negative and his CXR was without any acute cardiopulmonary changes. A nitroglycerin and heparin gtt were initiated and his home ASA, Plavix, BB, and statin were continued. He was admitted to the stepdown unit to further rule out for any acute cardiac ischemia given his significant cardiac history. He had no further chest pain overnight, his cardiac enzymes were negative, and he had no acute changes on telemetry. He will be discharged home in good condition and follow up as previously scheduled with Dr. [name].  Discharge Vitals:  Blood pressure 134/78, pulse 64, temperature 97.8 F (36.6 C), temperature source Oral, resp. rate 18, height 5\' 9"  (1.753 m), weight 82.8 kg (182 lb 8.7 oz), SpO2 98.00%.   Labs:  Lab Results  Component Value Date   WBC 7.7 10/20/2011   HGB 14.1 10/20/2011   HCT 40.2 10/20/2011   MCV 87.6 10/20/2011   PLT 161 10/20/2011     Lab 10/20/11 0500 10/19/11 2340  NA 141 --  K 3.9 --  CL 107 --  CO2 25 --  BUN 8 --  CREATININE 0.84 --  CALCIUM  9.4 --  PROT -- 7.1  BILITOT -- 0.4  ALKPHOS -- 64  ALT -- 20  AST -- 18  GLUCOSE 104* --   10/19/11 Troponin i, poc 0.00 Basename 10/20/11 1028 10/20/11 0600  CKTOTAL 41 41  CKMB 2.0 2.0  TROPONINI <0.30 <0.30    Lab Results  Component Value Date   CHOL 136 10/20/2011   Lab Results  Component Value Date   HDL 32* 10/20/2011   Lab Results  Component Value Date   LDLCALC 79 10/20/2011   Lab Results  Component Value Date   TRIG 124 10/20/2011   Lab Results  Component Value Date   CHOLHDL 4.3 10/20/2011    10/20/2011 02:54  TSH 2.850    10/19/2011 23:40  Prothrombin Time 13.4  INR 1.00    Follow-up Information    Follow up with Dola Argyle, MD on 10/26/2011. (9:30; As previously scheduled)    Contact information:   [facility] [address], [place] [phone]         Discharge Medications:  Current Discharge Medication List    CONTINUE these medications which have NOT CHANGED   Details  aspirin EC 81 MG tablet Take 81 mg by mouth daily.      clopidogrel (PLAVIX) 75 MG tablet Take 75 mg by mouth daily.      diclofenac sodium (VOLTAREN) 1 % GEL Apply 1 application topically 2 (two)  times daily as needed. For pain to affected area     ferrous sulfate 325 (65 FE) MG tablet Take 325 mg by mouth 2 (two) times daily.      fish oil-omega-3 fatty acids 1000 MG capsule Take 2 g by mouth 2 (two) times daily.     furosemide (LASIX) 20 MG tablet Take 20 mg by mouth daily.      gabapentin (NEURONTIN) 300 MG capsule Take 300 mg by mouth 3 (three) times daily.      glucose blood (FREESTYLE LITE) test strip 1 each by Other route. Use as instructed to test blood sugar three times daily     insulin glargine (LANTUS) 100 UNIT/ML injection Inject 40 Units into the skin at bedtime.      insulin lispro (HUMALOG) 100 UNIT/ML injection Inject 10-25 Units into the skin 3 (three) times daily before meals. As directed;  patient uses home sliding scale     Insulin Pen Needle (NOVOFINE) 30G X 8 MM MISC Inject 1 packet into the skin as needed. Use for insulin injections three times a day     !! isosorbide mononitrate (IMDUR) 30 MG CR tablet Take 30 mg by mouth every morning. Takes with 1.5 - 60mg  tablet for total dose of 120mg      !! isosorbide mononitrate (IMDUR) 60 MG 24 hr tablet Take 90 mg by mouth daily. (takes in addition to 30mg  tablet for total dose of 120mg )     levothyroxine (SYNTHROID, LEVOTHROID) 75 MCG tablet Take 75 mcg by mouth daily.      metFORMIN (GLUCOPHAGE) 1000 MG tablet Take 1,000 mg by mouth 2 (two) times daily.      nebivolol (BYSTOLIC) 10 MG tablet Take 10 mg by mouth daily.      nitroGLYCERIN (NITROSTAT) 0.4 MG SL tablet Place 0.4 mg under the tongue every 5 (five) minutes as needed. For three doses; For chest pain     ondansetron (ZOFRAN) 8 MG tablet Take 8 mg by mouth every 12 (twelve) hours as needed. For nausea    pantoprazole (PROTONIX) 40 MG tablet Take 40 mg by mouth daily.      pramipexole (MIRAPEX) 0.25 MG tablet Take 0.25 mg by mouth 2 (two) times daily as needed. For restless legs     sertraline (ZOLOFT) 100 MG tablet Take 1 tablet (100 mg total) by mouth 2 (two) times daily. Qty: 180 tablet, Refills: 0    simvastatin (ZOCOR) 40 MG tablet Take 1 tablet (40 mg total) by mouth at bedtime. Qty: 90 tablet, Refills: 0     !! - Potential duplicate medications found. Please discuss with provider.     Outstanding Labs/Studies: None  Duration of Discharge Encounter: Greater than 30 minutes including physician time.  Signed, Dehaven Sine, PA-C 10/20/2011, 2:34 PM

## 2011-10-20 NOTE — Progress Notes (Signed)
ANTICOAGULATION CONSULT NOTE - Follow up [facility] for heparin Indication: chest pain/ACS  Allergies  Allergen Reactions  . Morphine     REACTION: hallucinations    Patient Measurements: Height: 5\' 9"  (175.3 cm) Weight: 182 lb 8.7 oz (82.8 kg) IBW/kg (Calculated) : 70.7   Vital Signs: Temp: 96.8 F (36 C) (11/14 0400) Temp src: Oral (11/14 0400) BP: 142/81 mmHg (11/14 0400) Pulse Rate: 53  (11/14 0400)  Labs:  Basename 10/20/11 1025 10/20/11 0600 10/20/11 0500 10/19/11 2340  HGB -- -- 14.1 14.3  HCT -- -- 40.2 39.4  PLT -- -- 161 167  APTT -- -- -- 30  LABPROT -- -- -- 13.4  INR -- -- -- 1.00  HEPARINUNFRC 0.13* -- -- --  CREATININE -- -- 0.84 0.84  CKTOTAL -- 41 -- --  CKMB -- 2.0 -- --  TROPONINI -- <0.30 -- --   Estimated Creatinine Clearance: 90 ml/min (by C-G formula based on Cr of 0.84).  Medical History: Past Medical History  Diagnosis Date  . Diabetes mellitus type II, uncontrolled   . OSA (obstructive sleep apnea)   . Hyperlipidemia   . RBBB (right bundle branch block)   . Depression     Bipolar  . Edema   . Hypertension   . Falling episodes     these have occurred in the past and again recurring 2011  . Spondylosis     C5-6, C6-7 MRI 2010  . CAD (coronary artery disease)     status post coronary artery bypass graft in 2000,status post cardiac cath in 2006, 2009 ....continued chest pain and SOB despite oral medication adjestments including Ranexa.  Catheter November 2009/ DES  Stent...11/26/2008 to distal  RCA leading to acute marginal.  Cath 01/26/2010 continued patentcy grafts and stent to RCA , Modest progression of distal disease...medical Rx/   . Hx of CABG     2000,  / one median sternotomy suture broken her chest x-ray November, 2010, no clinical significance  . Ejection fraction     normal (echo 07/16/2011)  . Palpitations     event recorder showed sinus rhythm  . Nephrolithiasis   . Restless leg   . Dizziness   .  Shortness of breath     CPX April, 2011, mild functional limitation, no clear pulmonary or cardiac limitation, possible deconditioning and mild chronotropic incompetence( peak heart rate 130)  . Cerebral ischemia     MRI November, 2010, chronic microvascular ischemia  . Anemia     hemoglobin 7.4, iron deficiency, January, 2011, 2 unit transfusion, endoscopy normal, capsule endoscopy February, 2011 no small bowel abnormalities.   Most likely source gastric erosions, followed by GI    Medications:  Scheduled:     . aspirin  324 mg Oral NOW   Or  . aspirin  300 mg Rectal NOW  . aspirin EC  325 mg Oral Daily  . clopidogrel  75 mg Oral Daily  . ferrous sulfate  325 mg Oral BID  . furosemide  20 mg Oral Daily  . gabapentin  300 mg Oral TID  . heparin  1,100 Units/hr Intravenous To Major  . influenza  inactive virus vaccine  0.5 mL Intramuscular Tomorrow-1000  . insulin aspart  0-15 Units Subcutaneous TID WC  . insulin aspart  0-5 Units Subcutaneous QHS  . insulin glargine  40 Units Subcutaneous QHS  . isosorbide mononitrate  120 mg Oral Daily  . levothyroxine  75 mcg Oral Daily  .  nebivolol  10 mg Oral Daily  . nitroGLYCERIN  3 mcg/min Intravenous Once  . omega-3 acid ethyl esters  2 g Oral BID  . ondansetron      . pantoprazole  40 mg Oral Daily  . pramipexole  0.25 mg Oral BID  . sertraline  100 mg Oral BID  . simvastatin  40 mg Oral QHS  . DISCONTD: fish oil-omega-3 fatty acids  2 g Oral BID  . DISCONTD: isosorbide mononitrate  30 mg Oral QAM  . DISCONTD: isosorbide mononitrate  90 mg Oral Daily    Assessment: 65 yo M on heparin infusion for chest pain. Heparin level is below-goal. No problem with line per RN.    Goal of Therapy:  Heparin level 0.3-0.7 units/ml   Plan:  1. Heparin IV bolus of 2500 units x 1, then increase infusion to 1400 units/hr. 2. Heparin level 6 hours after rate change.   Otila Back 10/20/2011,11:33 AM

## 2011-10-20 NOTE — Progress Notes (Signed)
ANTICOAGULATION CONSULT NOTE - Initial Consult  Pharmacy Consult for heparin Indication: chest pain/ACS  Allergies  Allergen Reactions  . Morphine     REACTION: hallucinations    Patient Measurements: Height: 5' 10.8" (179.8 cm) Weight: 185 lb (83.915 kg) IBW/kg (Calculated) : 74.84   Vital Signs: Temp: 97.4 F (36.3 C) (11/13 2338) Temp src: Oral (11/13 2338) BP: 121/58 mmHg (11/13 2338) Pulse Rate: 48  (11/14 0300)  Labs:  Basename 10/19/11 2340  HGB 14.3  HCT 39.4  PLT 167  APTT 30  LABPROT 13.4  INR 1.00  HEPARINUNFRC --  CREATININE 0.84  CKTOTAL --  CKMB --  TROPONINI --   Estimated Creatinine Clearance: 95.2 ml/min (by C-G formula based on Cr of 0.84).  Medical History: Past Medical History  Diagnosis Date  . Diabetes mellitus type II, uncontrolled   . OSA (obstructive sleep apnea)   . Hyperlipidemia   . RBBB (right bundle branch block)   . Depression     Bipolar  . Edema   . Hypertension   . Falling episodes     these have occurred in the past and again recurring 2011  . Spondylosis     C5-6, C6-7 MRI 2010  . CAD (coronary artery disease)     status post coronary artery bypass graft in 2000,status post cardiac cath in 2006, 2009 ....continued chest pain and SOB despite oral medication adjestments including Ranexa.  Catheter November 2009/ DES  Stent...11/26/2008 to distal  RCA leading to acute marginal.  Cath 01/26/2010 continued patentcy grafts and stent to RCA , Modest progression of distal disease...medical Rx/   . Hx of CABG     2000,  / one median sternotomy suture broken her chest x-ray November, 2010, no clinical significance  . Ejection fraction     normal (echo 07/16/2011)  . Palpitations     event recorder showed sinus rhythm  . Nephrolithiasis   . Restless leg   . Dizziness   . Shortness of breath     CPX April, 2011, mild functional limitation, no clear pulmonary or cardiac limitation, possible deconditioning and mild chronotropic  incompetence( peak heart rate 130)  . Cerebral ischemia     MRI November, 2010, chronic microvascular ischemia  . Anemia     hemoglobin 7.4, iron deficiency, January, 2011, 2 unit transfusion, endoscopy normal, capsule endoscopy February, 2011 no small bowel abnormalities.   Most likely source gastric erosions, followed by GI    Medications:  Scheduled:    . heparin  1,100 Units/hr Intravenous To Major  . nitroGLYCERIN  3 mcg/min Intravenous Once  . ondansetron        Assessment: 65yo male w/ prior CAD Hx c/o CP at rest x2 episodes, second more severe and lasting one hr, relieved spontaneously, to begin heparin.  Goal of Therapy:  Heparin level 0.3-0.7 units/ml   Plan:  Will give heparin 4000 units IV bolus x1 followed by gtt at 1100 units/hr.  Monitor CBC and heparin levels.  Rogue Bussing PharmD BCPS 10/20/2011,3:19 AM

## 2011-10-20 NOTE — Progress Notes (Signed)
SUBJECTIVE:    Patient is feeling better this morning. I know him extremely well. He has significant coronary disease. He was stressed by situation yesterday and felt poorly. He had chest discomfort and nausea and vomiting. His first enzyme is normal and he feels much better today. There is no diagnostic EKG changes.   Filed Vitals:   10/19/11 2200 10/19/11 2338 10/20/11 0300 10/20/11 0400  BP: 122/61 121/58  142/81  Pulse: 52 53 48 53  Temp: 97.6 F (36.4 C) 97.4 F (36.3 C)  96.8 F (36 C)  TempSrc: Oral Oral  Oral  Resp: 18 15 11 18   Height:    5\' 9"  (1.753 m)  Weight:    182 lb 8.7 oz (82.8 kg)  SpO2: 100% 97% 98% 98%    Intake/Output Summary (Last 24 hours) at 10/20/11 0725 Last data filed at 10/20/11 0630  Gross per 24 hour  Intake  124.7 ml  Output      0 ml  Net  124.7 ml    LABS: Basic Metabolic Panel:  Basename 10/19/11 2340  NA 137  K 4.5  CL 100  CO2 27  GLUCOSE 136*  BUN 9  CREATININE 0.84  CALCIUM 9.6  MG --  PHOS --   Liver Function Tests:  Basename 10/19/11 2340  AST 18  ALT 20  ALKPHOS 64  BILITOT 0.4  PROT 7.1  ALBUMIN 4.1   No results found for this basename: LIPASE:2,AMYLASE:2 in the last 72 hours CBC:  Basename 10/19/11 2340  WBC 10.6*  NEUTROABS 8.0*  HGB 14.3  HCT 39.4  MCV 86.6  PLT 167   Cardiac Enzymes: No results found for this basename: CKTOTAL:3,CKMB:3,CKMBINDEX:3,TROPONINI:3 in the last 72 hours BNP: No results found for this basename: POCBNP:3 in the last 72 hours D-Dimer: No results found for this basename: DDIMER:2 in the last 72 hours Hemoglobin A1C: No results found for this basename: HGBA1C in the last 72 hours Fasting Lipid Panel: No results found for this basename: CHOL,HDL,LDLCALC,TRIG,CHOLHDL,LDLDIRECT in the last 72 hours Thyroid Function Tests: No results found for this basename: TSH,T4TOTAL,FREET3,T3FREE,THYROIDAB in the last 72 hours  RADIOLOGY: Dg Chest Port 1 View  10/19/2011  *RADIOLOGY  REPORT*  Clinical Data: Mid chest pain.  PORTABLE CHEST - 1 VIEW  Comparison: Chest radiograph performed 12/18/2010  Findings: The lungs are well-aerated and clear.  There is no evidence of focal opacification, pleural effusion or pneumothorax. Bilateral nipple shadows are noted.  The cardiomediastinal silhouette is borderline normal in size; the patient is status post median sternotomy, with evidence of prior CABG.  No acute osseous abnormalities are seen.  IMPRESSION: No acute cardiopulmonary process seen.  Original Report Authenticated By: Santa Lighter, [name]    PHYSICAL EXAM   he stable and feeling much better. He is oriented to person time and place. Affect is normal. Head is atraumatic. There is no jugulovenous distention. Lungs are clear. Respiratory effort is nonlabored. Cardiac exam reveals S1-S2. There no clicks or significant murmurs. The abdomen is soft. There is no peripheral edema.  TELEMETRY: Telemetry is reviewed by me. There is normal sinus rhythm.   ASSESSMENT AND PLAN:  Active Problems:  CAD (coronary artery disease)  Acute coronary syndrome        At this point the patient appears to be stable. I believe that his symptoms yesterday may have been related to stress. I will await further cardiac enzymes. If his troponins are normal he can be discharged possibly later today. If his troponins  are abnormal we will have to proceed with repeat catheterization.   Dola Argyle 10/20/2011 7:25 AM

## 2011-10-20 NOTE — ED Provider Notes (Signed)
History     CSN: [id number] Arrival date & time: 10/19/2011 10:00 PM   First MD Initiated Contact with Patient 10/19/11 2306      Chief Complaint  Patient presents with  . Chest Pain    (Consider location/radiation/quality/duration/timing/severity/associated sxs/prior Treatment) HPI  Patient relates he had four-vessel bypass in 2000, and he had 2 stents prior to surgery and had a another stent placed last year. Relates this afternoon about 4 PM he was driving back after checking his daughter's car which was being towed and he felt like his whole chest was tight. He states it radiated into both shoulders with nausea but no vomiting. He took one nitroglycerin and the pain resolved. He states the pain lasted about 35 minutes. He states the pain he had before tonight was located in the left upper area of the chest and described as a cramp. He relates about 7:15 he was watching a friend's daughter play in a basketball game and the pain returned with diaphoresis. He states this time the pain lasted about 10 minutes. He was driving home from the game and had some nausea with vomiting x3-4 times. He states his pain was not as severe this time. He went to the EMS station and he was given one nitroglycerin and 3 baby aspirin to chew. He relates his pain is now a 0 it wasn't 8 with the first episode. He states he also had a headache before he got nitroglycerin which is frontal. He also describes mild shortness of breath with the pain.   Primary care physician Dr. Shawna [facility] Cardiology Dr. [name]   Past Medical History  Diagnosis Date  . Diabetes mellitus type II, uncontrolled   . OSA (obstructive sleep apnea)   . Hyperlipidemia   . RBBB (right bundle branch block)   . Depression     Bipolar  . Edema   . Hypertension   . Falling episodes     these have occurred in the past and again recurring 2011  . Spondylosis     C5-6, C6-7 MRI 2010  . CAD (coronary artery disease)     status post coronary artery  bypass graft in 2000,status post cardiac cath in 2006, 2009 ....continued chest pain and SOB despite oral medication adjestments including Ranexa.  Catheter November 2009/ DES  Stent...11/26/2008 to distal  RCA leading to acute marginal.  Cath 01/26/2010 continued patentcy grafts and stent to RCA , Modest progression of distal disease...medical Rx/   . Hx of CABG     2000,  / one median sternotomy suture broken her chest x-ray November, 2010, no clinical significance  . Ejection fraction     normal (echo 07/16/2011)  . Palpitations     event recorder showed sinus rhythm  . Nephrolithiasis   . Restless leg   . Dizziness   . Shortness of breath     CPX April, 2011, mild functional limitation, no clear pulmonary or cardiac limitation, possible deconditioning and mild chronotropic incompetence( peak heart rate 130)  . Cerebral ischemia     MRI November, 2010, chronic microvascular ischemia  . Anemia     hemoglobin 7.4, iron deficiency, January, 2011, 2 unit transfusion, endoscopy normal, capsule endoscopy February, 2011 no small bowel abnormalities.   Most likely source gastric erosions, followed by GI    Past Surgical History  Procedure Date  . Nasal septum surgery     UP3  . Coronary artery bypass graft     2000    Family History  Problem Relation Age of Onset  . Cancer Brother     pancreatic cancer  . Diabetes Brother     4   . Stroke Brother   . Diabetes Brother   . Cancer Brother   . Diabetes Brother   . Coronary artery disease Brother   . Diabetes Brother   . Hypothyroidism Brother   . Heart attack      Nephew   coronary artery disease both parents  History  Substance Use Topics  . Smoking status: Never Smoker   . Smokeless tobacco: Never Used  . Alcohol Use: No   Lives with spouse Patient on disability for cardiac disease   Review of Systems  All other systems reviewed and are negative.    Allergies  Morphine  Home Medications   Current Outpatient Rx    Name Route Sig Dispense Refill  . ASPIRIN EC 81 MG PO TBEC Oral Take 81 mg by mouth daily.      Marland Kitchen CLOPIDOGREL BISULFATE 75 MG PO TABS Oral Take 75 mg by mouth daily.      Marland Kitchen DICLOFENAC SODIUM 1 % TD GEL Topical Apply 1 application topically 2 (two) times daily as needed. For pain to affected area     . FERROUS SULFATE 325 (65 FE) MG PO TABS Oral Take 325 mg by mouth 2 (two) times daily.      . OMEGA-3 FATTY ACIDS 1000 MG PO CAPS Oral Take 2 g by mouth 2 (two) times daily.     . FUROSEMIDE 20 MG PO TABS Oral Take 20 mg by mouth daily.      Marland Kitchen GABAPENTIN 300 MG PO CAPS Oral Take 300 mg by mouth 3 (three) times daily.      Marland Kitchen GLUCOSE BLOOD VI STRP Other 1 each by Other route. Use as instructed to test blood sugar three times daily     . INSULIN GLARGINE 100 UNIT/ML [place] SOLN Subcutaneous Inject 40 Units into the skin at bedtime.      . INSULIN LISPRO (HUMAN) 100 UNIT/ML [place] SOLN Subcutaneous Inject 10-25 Units into the skin 3 (three) times daily before meals. As directed; patient uses home sliding scale     . INSULIN PEN NEEDLE 30G X 8 MM MISC Subcutaneous Inject 1 packet into the skin as needed. Use for insulin injections three times a day     . ISOSORBIDE MONONITRATE 30 MG PO TB24 Oral Take 30 mg by mouth every morning. Takes with 1.5 - 60mg  tablet for total dose of 120mg      . ISOSORBIDE MONONITRATE ER 60 MG PO TB24 Oral Take 90 mg by mouth daily. (takes in addition to 30mg  tablet for total dose of 120mg )     . LEVOTHYROXINE SODIUM 75 MCG PO TABS Oral Take 75 mcg by mouth daily.      Marland Kitchen METFORMIN HCL 1000 MG PO TABS Oral Take 1,000 mg by mouth 2 (two) times daily.      . NEBIVOLOL HCL 10 MG PO TABS Oral Take 10 mg by mouth daily.      Marland Kitchen NITROGLYCERIN 0.4 MG SL SUBL Sublingual Place 0.4 mg under the tongue every 5 (five) minutes as needed. For three doses; For chest pain     . ONDANSETRON HCL 8 MG PO TABS Oral Take 8 mg by mouth every 12 (twelve) hours as needed. For nausea    . PANTOPRAZOLE SODIUM 40  MG PO TBEC Oral Take 40 mg by mouth daily.      Marland Kitchen  PRAMIPEXOLE DIHYDROCHLORIDE 0.25 MG PO TABS Oral Take 0.25 mg by mouth 2 (two) times daily as needed. For restless legs     . SERTRALINE HCL 100 MG PO TABS Oral Take 1 tablet (100 mg total) by mouth 2 (two) times daily. 180 tablet 0  . SIMVASTATIN 40 MG PO TABS Oral Take 1 tablet (40 mg total) by mouth at bedtime. 90 tablet 0    BP 121/58  Pulse 53  Temp(Src) 97.4 F (36.3 C) (Oral)  Resp 15  SpO2 97%  Physical Exam  Constitutional: He is oriented to person, place, and time. He appears well-developed and well-nourished.  Non-toxic appearance. He does not appear ill. No distress.  HENT:  Head: Normocephalic and atraumatic.  Right Ear: External ear normal.  Left Ear: External ear normal.  Nose: Nose normal. No mucosal edema or rhinorrhea.  Mouth/Throat: Oropharynx is clear and moist and mucous membranes are normal. No dental abscesses or uvula swelling.  Eyes: Conjunctivae and EOM are normal. Pupils are equal, round, and reactive to light.  Neck: Normal range of motion and full passive range of motion without pain. Neck supple.  Cardiovascular: Normal rate, regular rhythm and normal heart sounds.  Exam reveals no gallop and no friction rub.   No murmur heard. Pulmonary/Chest: Effort normal and breath sounds normal. No respiratory distress. He has no wheezes. He has no rhonchi. He has no rales. He exhibits no tenderness and no crepitus.  Abdominal: Soft. Normal appearance and bowel sounds are normal. He exhibits no distension. There is no tenderness. There is no rebound and no guarding.  Musculoskeletal: Normal range of motion. He exhibits no edema and no tenderness.       Moves all extremities well. No edema  Neurological: He is alert and oriented to person, place, and time. He has normal strength. No cranial nerve deficit.  Skin: Skin is warm, dry and intact. No rash noted. No erythema. No pallor.  Psychiatric: He has a normal mood and  affect. His speech is normal and behavior is normal. His mood appears not anxious.    ED Course  Procedures (including critical care time) Results for orders placed during the hospital encounter of 10/19/11  CBC      Component Value Range   WBC 10.6 (*) 4.0 - 10.5 (K/uL)   RBC 4.55  4.22 - 5.81 (MIL/uL)   Hemoglobin 14.3  13.0 - 17.0 (g/dL)   HCT 39.4  39.0 - 52.0 (%)   MCV 86.6  78.0 - 100.0 (fL)   MCH 31.4  26.0 - 34.0 (pg)   MCHC 36.3 (*) 30.0 - 36.0 (g/dL)   RDW 13.0  11.5 - 15.5 (%)   Platelets 167  150 - 400 (K/uL)  DIFFERENTIAL      Component Value Range   Neutrophils Relative 75  43 - 77 (%)   Neutro Abs 8.0 (*) 1.7 - 7.7 (K/uL)   Lymphocytes Relative 17  12 - 46 (%)   Lymphs Abs 1.8  0.7 - 4.0 (K/uL)   Monocytes Relative 7  3 - 12 (%)   Monocytes Absolute 0.7  0.1 - 1.0 (K/uL)   Eosinophils Relative 1  0 - 5 (%)   Eosinophils Absolute 0.1  0.0 - 0.7 (K/uL)   Basophils Relative 0  0 - 1 (%)   Basophils Absolute 0.0  0.0 - 0.1 (K/uL)  COMPREHENSIVE METABOLIC PANEL      Component Value Range   Sodium 137  135 - 145 (mEq/L)  Potassium 4.5  3.5 - 5.1 (mEq/L)   Chloride 100  96 - 112 (mEq/L)   CO2 27  19 - 32 (mEq/L)   Glucose, Bld 136 (*) 70 - 99 (mg/dL)   BUN 9  6 - 23 (mg/dL)   Creatinine, Ser 0.84  0.50 - 1.35 (mg/dL)   Calcium 9.6  8.4 - 10.5 (mg/dL)   Total Protein 7.1  6.0 - 8.3 (g/dL)   Albumin 4.1  3.5 - 5.2 (g/dL)   AST 18  0 - 37 (U/L)   ALT 20  0 - 53 (U/L)   Alkaline Phosphatase 64  39 - 117 (U/L)   Total Bilirubin 0.4  0.3 - 1.2 (mg/dL)   GFR calc non Af Amer >90  >90 (mL/min)   GFR calc Af Amer >90  >90 (mL/min)  APTT      Component Value Range   aPTT 30  24 - 37 (seconds)  PROTIME-INR      Component Value Range   Prothrombin Time 13.4  11.6 - 15.2 (seconds)   INR 1.00  0.00 - 1.49   POCT I-STAT TROPONIN I      Component Value Range   Troponin i, poc 0.00  0.00 - 0.08 (ng/mL)   Comment 3             Laboratory interpretation minimal  leukocytosis and hyperglycemia otherwise normal  Dg Chest Port 1 View  10/19/2011  *RADIOLOGY REPORT*  Clinical Data: Mid chest pain.  PORTABLE CHEST - 1 VIEW  Comparison: Chest radiograph performed 12/18/2010  Findings: The lungs are well-aerated and clear.  There is no evidence of focal opacification, pleural effusion or pneumothorax. Bilateral nipple shadows are noted.  The cardiomediastinal silhouette is borderline normal in size; the patient is status post median sternotomy, with evidence of prior CABG.  No acute osseous abnormalities are seen.  IMPRESSION: No acute cardiopulmonary process seen.  Original Report Authenticated By: Santa Lighter, [name]    Date: 10/20/2011  Rate: 53  Rhythm: sinus bradycardia  QRS Axis: left  Intervals: normal  ST/T Wave abnormalities: inverted T waves anteriorly  Conduction Disutrbances:right bundle branch block  Narrative Interpretation:   Old EKG Reviewed: unchanged from 12/19/2010  Course Patient already given aspirin by EMS. He was started on a nitroglycerin drip. Will consult cardiology once his initial cardiac enzymes are back.  00:50 Dr Melina Copa will come see patient for admission.    Diagnoses that have been ruled out:  Diagnoses that are still under consideration:  Final diagnoses:  Chest pain  Coronary artery disease    Plan admit  Rolland Porter, MD, [place], MD 10/20/11 [phone]

## 2011-10-20 NOTE — ED Notes (Signed)
Attempt to call report x 1.  RN to call back.

## 2011-10-20 NOTE — H&P (Signed)
Cardiology H&P  Primary Care Povider: Drema Pry Primary Cardiologist: Cleatis Polka   HPI: [name] is a 65 y.o.male with CAD s/p CABG (3 SVG and LIMA to LAD) in 2000 and s/p PCI to native RCA  with most recent LHC in 01/2010 showing patent grafts and distal disease.  He presents to the ER today with an episode of chest pressure x 2 today.  The second episode was severe and lasted x 1hr.  It occurred at rest and was assoicated with SOB and nausea.  It was finally relieved spontaneously.  The pain was not similar to previous angina but seemed more severe.  He is currently on a nitro ggt but is pain free.  Past Medical History  Diagnosis Date  . Diabetes mellitus type II, uncontrolled   . OSA (obstructive sleep apnea)   . Hyperlipidemia   . RBBB (right bundle branch block)   . Depression     Bipolar  . Edema   . Hypertension   . Falling episodes     these have occurred in the past and again recurring 2011  . Spondylosis     C5-6, C6-7 MRI 2010  . CAD (coronary artery disease)     status post coronary artery bypass graft in 2000,status post cardiac cath in 2006, 2009 ....continued chest pain and SOB despite oral medication adjestments including Ranexa.  Catheter November 2009/ DES  Stent...11/26/2008 to distal  RCA leading to acute marginal.  Cath 01/26/2010 continued patentcy grafts and stent to RCA , Modest progression of distal disease...medical Rx/   . Hx of CABG     2000,  / one median sternotomy suture broken her chest x-ray November, 2010, no clinical significance  . Ejection fraction     normal (echo 07/16/2011)  . Palpitations     event recorder showed sinus rhythm  . Nephrolithiasis   . Restless leg   . Dizziness   . Shortness of breath     CPX April, 2011, mild functional limitation, no clear pulmonary or cardiac limitation, possible deconditioning and mild chronotropic incompetence( peak heart rate 130)  . Cerebral ischemia     MRI November, 2010, chronic microvascular  ischemia  . Anemia     hemoglobin 7.4, iron deficiency, January, 2011, 2 unit transfusion, endoscopy normal, capsule endoscopy February, 2011 no small bowel abnormalities.   Most likely source gastric erosions, followed by GI    Past Surgical History  Procedure Date  . Nasal septum surgery     UP3  . Coronary artery bypass graft     2000    Family History  Problem Relation Age of Onset  . Cancer Brother     pancreatic cancer  . Diabetes Brother     4   . Stroke Brother   . Diabetes Brother   . Cancer Brother   . Diabetes Brother   . Coronary artery disease Brother   . Diabetes Brother   . Hypothyroidism Brother   . Heart attack      Nephew    Social History:  reports that he has never smoked. He has never used smokeless tobacco. He reports that he does not drink alcohol or use illicit drugs.  Allergies:  Allergies  Allergen Reactions  . Morphine     REACTION: hallucinations    Current Facility-Administered Medications  Medication Dose Route Frequency Provider Last Rate Last Dose  . nitroGLYCERIN 0.2 mg/mL in dextrose 5 % infusion  3 mcg/min Intravenous Once Janice Norrie, MD 0.9  mL/hr at 10/19/11 2344 3 mcg/min at 10/19/11 2344  . ondansetron (ZOFRAN) 4 MG/2ML injection            Current Outpatient Prescriptions  Medication Sig Dispense Refill  . aspirin EC 81 MG tablet Take 81 mg by mouth daily.        . clopidogrel (PLAVIX) 75 MG tablet Take 75 mg by mouth daily.        . diclofenac sodium (VOLTAREN) 1 % GEL Apply 1 application topically 2 (two) times daily as needed. For pain to affected area       . ferrous sulfate 325 (65 FE) MG tablet Take 325 mg by mouth 2 (two) times daily.        . fish oil-omega-3 fatty acids 1000 MG capsule Take 2 g by mouth 2 (two) times daily.       . furosemide (LASIX) 20 MG tablet Take 20 mg by mouth daily.        Marland Kitchen gabapentin (NEURONTIN) 300 MG capsule Take 300 mg by mouth 3 (three) times daily.        Marland Kitchen glucose blood (FREESTYLE  LITE) test strip 1 each by Other route. Use as instructed to test blood sugar three times daily       . insulin glargine (LANTUS) 100 UNIT/ML injection Inject 40 Units into the skin at bedtime.        . insulin lispro (HUMALOG) 100 UNIT/ML injection Inject 10-25 Units into the skin 3 (three) times daily before meals. As directed; patient uses home sliding scale       . Insulin Pen Needle (NOVOFINE) 30G X 8 MM MISC Inject 1 packet into the skin as needed. Use for insulin injections three times a day       . isosorbide mononitrate (IMDUR) 30 MG CR tablet Take 30 mg by mouth every morning. Takes with 1.5 - 60mg  tablet for total dose of 120mg        . isosorbide mononitrate (IMDUR) 60 MG 24 hr tablet Take 90 mg by mouth daily. (takes in addition to 30mg  tablet for total dose of 120mg )       . levothyroxine (SYNTHROID, LEVOTHROID) 75 MCG tablet Take 75 mcg by mouth daily.        . metFORMIN (GLUCOPHAGE) 1000 MG tablet Take 1,000 mg by mouth 2 (two) times daily.        . nebivolol (BYSTOLIC) 10 MG tablet Take 10 mg by mouth daily.        . nitroGLYCERIN (NITROSTAT) 0.4 MG SL tablet Place 0.4 mg under the tongue every 5 (five) minutes as needed. For three doses; For chest pain       . ondansetron (ZOFRAN) 8 MG tablet Take 8 mg by mouth every 12 (twelve) hours as needed. For nausea      . pantoprazole (PROTONIX) 40 MG tablet Take 40 mg by mouth daily.        . pramipexole (MIRAPEX) 0.25 MG tablet Take 0.25 mg by mouth 2 (two) times daily as needed. For restless legs       . sertraline (ZOLOFT) 100 MG tablet Take 1 tablet (100 mg total) by mouth 2 (two) times daily.  180 tablet  0  . simvastatin (ZOCOR) 40 MG tablet Take 1 tablet (40 mg total) by mouth at bedtime.  90 tablet  0    ROS: A full review of systems is obtained and is negative except as noted in the HPI.  Physical Exam: Blood pressure  121/58, pulse 53, temperature 97.4 F (36.3 C), temperature source Oral, resp. rate 15, SpO2 97.00%.  GENERAL:  no acute distress.  EYES: Extra ocular movements are intact. There is no lid lag. Sclera is anicteric.  ENT: Oropharynx is clear. Dentition is within normal limits.  NECK: Supple. The thyroid is not enlarged.  LYMPH: There are no masses or lymphadenopathy present.  HEART: Regular rate and rhythm with no m/g/r.  Normal S1/S2. No JVD LUNGS: Clear to auscultation There are no rales, rhonchi, or wheezes.  ABDOMEN: Soft, non-tender, and non-distended with normoactive bowel sounds. There is no hepatosplenomegaly.  EXTREMITIES: No clubbing, cyanosis, or edema.  PULSES: Carotids were +2 and equal bilaterally with no bruits. Femoral pulses were +2 and equal bilaterally. DP/PT pulses were +2 and equal bilaterally.  SKIN: Warm, dry, and intact.  NEUROLOGIC: The patient was oriented to person, place, and time. No overt neurologic deficits were detected.  PSYCH: Normal judgment and insight, mood is appropriate.   Results: Lab Results  Component Value Date   WBC 10.6* 10/19/2011   HGB 14.3 10/19/2011   HCT 39.4 10/19/2011   MCV 86.6 10/19/2011   PLT 167 10/19/2011    Lab Results  Component Value Date   CREATININE 0.84 10/19/2011   BUN 9 10/19/2011   NA 137 10/19/2011   K 4.5 10/19/2011   CL 100 10/19/2011   CO2 27 10/19/2011    Lab Results  Component Value Date   ALT 20 10/19/2011   AST 18 10/19/2011   ALKPHOS 64 10/19/2011   BILITOT 0.4 10/19/2011    Lab Results  Component Value Date   TSH 1.88 07/16/2011    Lab Results  Component Value Date   LDLCALC  Value: 44        Total Cholesterol/HDL:CHD Risk Coronary Heart Disease Risk Table                     Men   Women  1/2 Average Risk   3.4   3.3  Average Risk       5.0   4.4  2 X Average Risk   9.6   7.1  3 X Average Risk  23.4   11.0        Use the calculated Patient Ratio above and the CHD Risk Table to determine the patient's CHD Risk.        ATP III CLASSIFICATION (LDL):  <100     mg/dL   Optimal  100-129  mg/dL   Near or Above                     Optimal  130-159  mg/dL   Borderline  160-189  mg/dL   High  >190     mg/dL   Very High 12/19/2010    Lab Results  Component Value Date   CKTOTAL 30 12/19/2010   CKMB 0.3 12/19/2010   TROPONINI  Value: 0.01        NO INDICATION OF MYOCARDIAL INJURY. 12/19/2010    CXR:  clear  EKG:  NSR with RBBB and no STT changes  Assessment/Plan: 65 yo WM with CAD and CABG in 2000 with 3 vein grafts and a LIMA as well as previous PCI to native RCA presents with ACS.  Currently pain free on nitro ggt. - admit to stepdown unit - continue ASA 325 and plavix 75 - start heparin ggt - continue nitro as needed - continue home betablocker and statin -  follow cardiac enzymes - probably LHC in AM, NPO except meds   [name] [name] 10/20/2011, 2:20 AM

## 2011-10-21 ENCOUNTER — Other Ambulatory Visit: Payer: Self-pay | Admitting: *Deleted

## 2011-10-21 MED ORDER — INSULIN GLARGINE 100 UNIT/ML ~~LOC~~ SOLN: 40.0000 [IU] | Freq: Every day | SUBCUTANEOUS | Status: DC

## 2011-10-26 ENCOUNTER — Encounter: Payer: Self-pay | Admitting: Cardiology

## 2011-10-26 ENCOUNTER — Telehealth: Payer: Self-pay | Admitting: Internal Medicine

## 2011-10-26 ENCOUNTER — Ambulatory Visit (INDEPENDENT_AMBULATORY_CARE_PROVIDER_SITE_OTHER): Payer: 59 | Admitting: Cardiology

## 2011-10-26 DIAGNOSIS — R0602 Shortness of breath: Secondary | ICD-10-CM

## 2011-10-26 DIAGNOSIS — D649 Anemia, unspecified: Secondary | ICD-10-CM

## 2011-10-26 DIAGNOSIS — I2510 Atherosclerotic heart disease of native coronary artery without angina pectoris: Secondary | ICD-10-CM

## 2011-10-26 LAB — CBC WITH DIFFERENTIAL/PLATELET
Basophils Absolute: 0 10*3/uL (ref 0.0–0.1)
Basophils Relative: 0.3 % (ref 0.0–3.0)
Eosinophils Absolute: 0.2 10*3/uL (ref 0.0–0.7)
Hemoglobin: 13.9 g/dL (ref 13.0–17.0)
Lymphs Abs: 1.8 10*3/uL (ref 0.7–4.0)
MCHC: 34.6 g/dL (ref 30.0–36.0)
MCV: 91.8 fl (ref 78.0–100.0)
Monocytes Absolute: 0.5 10*3/uL (ref 0.1–1.0)
Neutro Abs: 5.5 10*3/uL (ref 1.4–7.7)
RBC: 4.4 Mil/uL (ref 4.22–5.81)
RDW: 13.6 % (ref 11.5–14.6)

## 2011-10-26 MED ORDER — ISOSORBIDE MONONITRATE ER 120 MG PO TB24: 120.0000 mg | ORAL_TABLET | Freq: Every day | ORAL | Status: DC

## 2011-10-26 NOTE — Assessment & Plan Note (Signed)
Patient mentions that he had a dark stool that may have been tarry.  Today we will check a hemoglobin.  Will also try to arrange for him to have stool cards to see if his stool has turned positive.  Many of these things are positive he will be referred back to GI who have seen him over time.

## 2011-10-26 NOTE — Progress Notes (Signed)
HPI   Patient is seen back today for cardiology followup.  He is also seeing back for a post hospital visit.  The patient was admitted to hospital on October 19, 2011.  He had some chest discomfort and also some nausea.  He was upset at that time.  Eventually he came into the hospital and he stabilized.  His troponins were normal.  I have reviewed all the hospital records including the labs the H&P and discharge summary.  Since being in the hospital he is stable.  He has not had any recurrent chest pain.  He's had some mild arm discomfort.  He does mention that he had a dark stool that may have been tolerated in the last day or 2.  He has had GI bleeding in the past and the source was never found.  His hemoglobin in the hospital was 14.  Allergies  Allergen Reactions  . Morphine     REACTION: hallucinations    Current Outpatient Prescriptions  Medication Sig Dispense Refill  . aspirin EC 81 MG tablet Take 81 mg by mouth daily.        . clopidogrel (PLAVIX) 75 MG tablet Take 75 mg by mouth daily.        . diclofenac sodium (VOLTAREN) 1 % GEL Apply 1 application topically 2 (two) times daily as needed. For pain to affected area       . ferrous sulfate 325 (65 FE) MG tablet Take 325 mg by mouth 2 (two) times daily.        . fish oil-omega-3 fatty acids 1000 MG capsule Take 2 g by mouth 2 (two) times daily.       . furosemide (LASIX) 20 MG tablet Take 20 mg by mouth daily.        Marland Kitchen gabapentin (NEURONTIN) 300 MG capsule Take 300 mg by mouth 3 (three) times daily.        Marland Kitchen glucose blood (FREESTYLE LITE) test strip 1 each by Other route. Use as instructed to test blood sugar three times daily       . insulin glargine (LANTUS SOLOSTAR) 100 UNIT/ML injection Inject 40 Units into the skin daily.  3 mL  5  . insulin lispro (HUMALOG) 100 UNIT/ML injection Inject 10-25 Units into the skin 3 (three) times daily before meals. As directed; patient uses home sliding scale       . Insulin Pen Needle (NOVOFINE)  30G X 8 MM MISC Inject 1 packet into the skin as needed. Use for insulin injections three times a day       . isosorbide mononitrate (IMDUR) 120 MG 24 hr tablet Take 120 mg by mouth daily.        Marland Kitchen levothyroxine (SYNTHROID, LEVOTHROID) 75 MCG tablet Take 75 mcg by mouth daily.        . metFORMIN (GLUCOPHAGE) 1000 MG tablet Take 1,000 mg by mouth 2 (two) times daily.        . nebivolol (BYSTOLIC) 10 MG tablet Take 10 mg by mouth daily.        . nitroGLYCERIN (NITROSTAT) 0.4 MG SL tablet Place 0.4 mg under the tongue every 5 (five) minutes as needed. For three doses; For chest pain       . ondansetron (ZOFRAN) 8 MG tablet Take 8 mg by mouth every 12 (twelve) hours as needed. For nausea      . pantoprazole (PROTONIX) 40 MG tablet Take 40 mg by mouth daily.        Marland Kitchen  pramipexole (MIRAPEX) 0.25 MG tablet Take 0.25 mg by mouth 2 (two) times daily as needed. For restless legs       . sertraline (ZOLOFT) 100 MG tablet Take 1 tablet (100 mg total) by mouth 2 (two) times daily.  180 tablet  0  . simvastatin (ZOCOR) 40 MG tablet Take 1 tablet (40 mg total) by mouth at bedtime.  90 tablet  0    History   Social History  . Marital Status: Married    Spouse Name: N/A    Number of Children: N/A  . Years of Education: N/A   Occupational History  . Not on file.   Social History Main Topics  . Smoking status: Never Smoker   . Smokeless tobacco: Never Used  . Alcohol Use: No  . Drug Use: No  . Sexually Active: Not on file   Other Topics Concern  . Not on file   Social History Narrative  . No narrative on file    Family History  Problem Relation Age of Onset  . Cancer Brother     pancreatic cancer  . Diabetes Brother     4   . Stroke Brother   . Diabetes Brother   . Cancer Brother   . Diabetes Brother   . Coronary artery disease Brother   . Diabetes Brother   . Hypothyroidism Brother   . Heart attack      Nephew    Past Medical History  Diagnosis Date  . Diabetes mellitus type II,  uncontrolled   . OSA (obstructive sleep apnea)   . Hyperlipidemia   . RBBB (right bundle branch block)   . Depression     Bipolar  . Edema   . Hypertension   . Falling episodes     these have occurred in the past and again recurring 2011  . Spondylosis     C5-6, C6-7 MRI 2010  . CAD (coronary artery disease)     status post coronary artery bypass graft in 2000,status post cardiac cath in 2006, 2009 ....continued chest pain and SOB despite oral medication adjestments including Ranexa.  Catheter November 2009/ DES  Stent...11/26/2008 to distal  RCA leading to acute marginal.  Cath 01/26/2010 continued patentcy grafts and stent to RCA , Modest progression of distal disease...medical Rx/   . Hx of CABG     2000,  / one median sternotomy suture broken her chest x-ray November, 2010, no clinical significance  . Ejection fraction     normal (echo 07/16/2011)  . Palpitations     event recorder showed sinus rhythm  . Nephrolithiasis   . Restless leg   . Dizziness   . Shortness of breath     CPX April, 2011, mild functional limitation, no clear pulmonary or cardiac limitation, possible deconditioning and mild chronotropic incompetence( peak heart rate 130)  . Cerebral ischemia     MRI November, 2010, chronic microvascular ischemia  . Anemia     hemoglobin 7.4, iron deficiency, January, 2011, 2 unit transfusion, endoscopy normal, capsule endoscopy February, 2011 no small bowel abnormalities.   Most likely source gastric erosions, followed by GI    Past Surgical History  Procedure Date  . Nasal septum surgery     UP3  . Coronary artery bypass graft     2000    ROS     Patient denies fever, chills, headache, sweats, rash, change in vision, change in hearing, chest pain, cough, nausea vomiting, urinary symptoms.  All other systems  are reviewed and are negative.  PHYSICAL EXAM   Patient is oriented to person time and place.  Affect is normal.  He is here with his wife today.  It is  atraumatic.  There is no jugular venous distention.  Lungs are clear.  Respiratory effort is not labored.  Cardiac exam reveals an S1-S2.  No clicks or significant murmurs.  The abdomen is soft.  There is no peripheral edema.  There no musculoskeletal deformities.  No skin rashes. Filed Vitals:   10/26/11 0941  BP: 120/62  Pulse: 60  Height: 5\' 9"  (1.753 m)  Weight: 184 lb (83.462 kg)     ASSESSMENT & PLAN

## 2011-10-26 NOTE — Assessment & Plan Note (Signed)
He is not having any significant shortness of breath at this time.

## 2011-10-26 NOTE — Telephone Encounter (Signed)
Wants Jenny Reichmann to return his call about a medication. No further message was left. Thanks.

## 2011-10-26 NOTE — Telephone Encounter (Signed)
[name] [name] please call pt at [phone]

## 2011-10-26 NOTE — Assessment & Plan Note (Signed)
Coronary disease is stable.  He did not have a definite cardiac event with his recent chest pain that led to hospitalization.  No further workup today.

## 2011-10-26 NOTE — Patient Instructions (Addendum)
Your physician recommends that you return for lab work in:  Today (CBCw/Diff)-Return your stool cards to the [facility] lab or our lab when they are finished.  Marland Kitchen

## 2011-10-27 NOTE — Telephone Encounter (Signed)
Pt lives at [facility] and wanted samples of insulin and Bystolic.  He left his medicine at home and is here for the holidays.  Told pt he could come by and pick up samples

## 2011-12-16 ENCOUNTER — Other Ambulatory Visit: Payer: Self-pay | Admitting: *Deleted

## 2011-12-16 MED ORDER — SIMVASTATIN 40 MG PO TABS: 40.0000 mg | ORAL_TABLET | Freq: Every day | ORAL | Status: DC

## 2011-12-17 ENCOUNTER — Other Ambulatory Visit: Payer: Self-pay | Admitting: *Deleted

## 2011-12-17 MED ORDER — PANTOPRAZOLE SODIUM 40 MG PO TBEC: 40.0000 mg | DELAYED_RELEASE_TABLET | Freq: Every day | ORAL | Status: DC

## 2012-01-20 ENCOUNTER — Telehealth: Payer: Self-pay | Admitting: Internal Medicine

## 2012-01-20 NOTE — Telephone Encounter (Signed)
Pt is in town from [employer] and is wanting to come in to check hga1c. Pt wife is sch for ov with Dr Shawna Orleans on 01/21/12 at 1:15pm and pt would like to come in then for lab. Pls advise if ok.

## 2012-01-21 ENCOUNTER — Other Ambulatory Visit: Payer: 59 | Admitting: Internal Medicine

## 2012-01-21 ENCOUNTER — Other Ambulatory Visit: Payer: Self-pay | Admitting: *Deleted

## 2012-01-21 ENCOUNTER — Other Ambulatory Visit (INDEPENDENT_AMBULATORY_CARE_PROVIDER_SITE_OTHER): Payer: 59

## 2012-01-21 ENCOUNTER — Other Ambulatory Visit: Payer: Self-pay

## 2012-01-21 DIAGNOSIS — E119 Type 2 diabetes mellitus without complications: Secondary | ICD-10-CM

## 2012-01-21 LAB — HEMOGLOBIN A1C: Hgb A1c MFr Bld: 7.3 % — ABNORMAL HIGH (ref 4.6–6.5)

## 2012-01-21 LAB — BASIC METABOLIC PANEL
BUN: 12 mg/dL (ref 6–23)
Calcium: 9 mg/dL (ref 8.4–10.5)
Creatinine, Ser: 1.1 mg/dL (ref 0.4–1.5)
GFR: 74.91 mL/min (ref >60.00)

## 2012-01-21 MED ORDER — INSULIN PEN NEEDLE 30G X 8 MM MISC: 1.0000 | Status: DC | PRN

## 2012-01-21 MED ORDER — GABAPENTIN 300 MG PO CAPS: 300.0000 mg | ORAL_CAPSULE | Freq: Three times a day (TID) | ORAL | Status: DC

## 2012-01-21 MED ORDER — METFORMIN HCL 1000 MG PO TABS: 1000.0000 mg | ORAL_TABLET | Freq: Two times a day (BID) | ORAL | Status: DC

## 2012-01-21 MED ORDER — INSULIN LISPRO 100 UNIT/ML ~~LOC~~ SOLN: 10.0000 [IU] | Freq: Three times a day (TID) | SUBCUTANEOUS | Status: DC

## 2012-01-21 MED ORDER — PRAMIPEXOLE DIHYDROCHLORIDE 0.25 MG PO TABS: 0.2500 mg | ORAL_TABLET | Freq: Every day | ORAL | Status: DC

## 2012-01-21 NOTE — Telephone Encounter (Signed)
Yes, ok for lab

## 2012-01-21 NOTE — Telephone Encounter (Signed)
Rx sent to pharmacy   

## 2012-01-21 NOTE — Telephone Encounter (Signed)
Addended by: Colleen Can on: 01/21/2012 04:30 PM   Modules accepted: Orders

## 2012-01-25 ENCOUNTER — Ambulatory Visit (INDEPENDENT_AMBULATORY_CARE_PROVIDER_SITE_OTHER): Payer: 59 | Admitting: Cardiology

## 2012-01-25 ENCOUNTER — Encounter: Payer: Self-pay | Admitting: Cardiology

## 2012-01-25 ENCOUNTER — Other Ambulatory Visit: Payer: Self-pay

## 2012-01-25 DIAGNOSIS — I2510 Atherosclerotic heart disease of native coronary artery without angina pectoris: Secondary | ICD-10-CM

## 2012-01-25 DIAGNOSIS — D649 Anemia, unspecified: Secondary | ICD-10-CM

## 2012-01-25 DIAGNOSIS — R42 Dizziness and giddiness: Secondary | ICD-10-CM

## 2012-01-25 MED ORDER — INSULIN LISPRO 100 UNIT/ML ~~LOC~~ SOLN: 10.0000 [IU] | Freq: Three times a day (TID) | SUBCUTANEOUS | Status: DC

## 2012-01-25 NOTE — Assessment & Plan Note (Signed)
He is not having any marked dizziness. No change in therapy.

## 2012-01-25 NOTE — Telephone Encounter (Signed)
Pt states he usually gets humalog flex pens.  Rx sent for flex pens.  Rx resent to pharmacy for flex pens.

## 2012-01-25 NOTE — Patient Instructions (Signed)
Your physician wants you to follow-up in:  6 months. You will receive a reminder letter in the mail two months in advance. If you don't receive a letter, please call our office to schedule the follow-up appointment.   

## 2012-01-25 NOTE — Progress Notes (Signed)
HPI Patient is here to followup coronary artery disease. He has complex disease. He does have intermittent pain for which he takes nitroglycerin. However his pattern has been stable. He has not had any syncope or presyncope. Allergies  Allergen Reactions  . Morphine     REACTION: hallucinations    Current Outpatient Prescriptions  Medication Sig Dispense Refill  . aspirin EC 81 MG tablet Take 81 mg by mouth daily.        . clopidogrel (PLAVIX) 75 MG tablet Take 75 mg by mouth daily.        . diclofenac sodium (VOLTAREN) 1 % GEL Apply 1 application topically 2 (two) times daily as needed. For pain to affected area       . ferrous sulfate 325 (65 FE) MG tablet Take 325 mg by mouth 2 (two) times daily.        . fish oil-omega-3 fatty acids 1000 MG capsule Take 2 g by mouth 2 (two) times daily.       . furosemide (LASIX) 20 MG tablet Take 20 mg by mouth daily.        Marland Kitchen gabapentin (NEURONTIN) 300 MG capsule Take 1 capsule (300 mg total) by mouth 3 (three) times daily.  90 capsule  5  . glucose blood (FREESTYLE LITE) test strip 1 each by Other route. Use as instructed to test blood sugar three times daily       . insulin glargine (LANTUS SOLOSTAR) 100 UNIT/ML injection Inject 40 Units into the skin daily.  3 mL  5  . insulin lispro (HUMALOG) 100 UNIT/ML injection Inject 10-25 Units into the skin 3 (three) times daily before meals. As directed; patient uses home sliding scale  3 mL  5  . Insulin Pen Needle (NOVOFINE) 30G X 8 MM MISC Inject 10 each into the skin as needed. Use for insulin injections three times a day  100 each  3  . isosorbide mononitrate (IMDUR) 120 MG 24 hr tablet Take 1 tablet (120 mg total) by mouth daily.  30 tablet  10  . levothyroxine (SYNTHROID, LEVOTHROID) 75 MCG tablet Take 75 mcg by mouth daily.        . metFORMIN (GLUCOPHAGE) 1000 MG tablet Take 1 tablet (1,000 mg total) by mouth 2 (two) times daily.  180 tablet  0  . nebivolol (BYSTOLIC) 10 MG tablet Take 10 mg by mouth  daily.        . nitroGLYCERIN (NITROSTAT) 0.4 MG SL tablet Place 0.4 mg under the tongue every 5 (five) minutes as needed. For three doses; For chest pain       . ondansetron (ZOFRAN) 8 MG tablet Take 8 mg by mouth every 12 (twelve) hours as needed. For nausea      . pantoprazole (PROTONIX) 40 MG tablet Take 1 tablet (40 mg total) by mouth daily.  90 tablet  1  . pramipexole (MIRAPEX) 0.25 MG tablet Take 1 tablet (0.25 mg total) by mouth at bedtime. For restless legs  90 tablet  0  . sertraline (ZOLOFT) 100 MG tablet Take 1 tablet (100 mg total) by mouth 2 (two) times daily.  180 tablet  0  . simvastatin (ZOCOR) 40 MG tablet Take 1 tablet (40 mg total) by mouth at bedtime.  90 tablet  0    History   Social History  . Marital Status: Married    Spouse Name: N/A    Number of Children: N/A  . Years of Education: N/A   Occupational  History  . Not on file.   Social History Main Topics  . Smoking status: Never Smoker   . Smokeless tobacco: Never Used  . Alcohol Use: No  . Drug Use: No  . Sexually Active: Not on file   Other Topics Concern  . Not on file   Social History Narrative  . No narrative on file    Family History  Problem Relation Age of Onset  . Cancer Brother     pancreatic cancer  . Diabetes Brother     4   . Stroke Brother   . Diabetes Brother   . Cancer Brother   . Diabetes Brother   . Coronary artery disease Brother   . Diabetes Brother   . Hypothyroidism Brother   . Heart attack      Nephew    Past Medical History  Diagnosis Date  . Diabetes mellitus type II, uncontrolled   . OSA (obstructive sleep apnea)   . Hyperlipidemia   . RBBB (right bundle branch block)   . Depression     Bipolar  . Edema   . Hypertension   . Falling episodes     these have occurred in the past and again recurring 2011  . Spondylosis     C5-6, C6-7 MRI 2010  . CAD (coronary artery disease)     status post coronary artery bypass graft in 2000,status post cardiac cath in  2006, 2009 ....continued chest pain and SOB despite oral medication adjestments including Ranexa.  Catheter November 2009/ DES  Stent...11/26/2008 to distal  RCA leading to acute marginal.  Cath 01/26/2010 continued patentcy grafts and stent to RCA , Modest progression of distal disease...medical Rx/   . Hx of CABG     2000,  / one median sternotomy suture broken her chest x-ray November, 2010, no clinical significance  . Ejection fraction     normal (echo 07/16/2011)  . Palpitations     event recorder showed sinus rhythm  . Nephrolithiasis   . Restless leg   . Dizziness   . Shortness of breath     CPX April, 2011, mild functional limitation, no clear pulmonary or cardiac limitation, possible deconditioning and mild chronotropic incompetence( peak heart rate 130)  . Cerebral ischemia     MRI November, 2010, chronic microvascular ischemia  . Anemia     hemoglobin 7.4, iron deficiency, January, 2011, 2 unit transfusion, endoscopy normal, capsule endoscopy February, 2011 no small bowel abnormalities.   Most likely source gastric erosions, followed by GI    Past Surgical History  Procedure Date  . Nasal septum surgery     UP3  . Coronary artery bypass graft     2000    ROS  Patient denies fever, chills, headache, sweats, rash, change in vision, change in hearing, cough, nausea vomiting, urinary symptoms. He has been having some mild intermittent leg pain with ambulation. He also has some mild nighttime leg cramps. These are not limiting problems. All other systems are reviewed and are negative.  PHYSICAL EXAM Patient is oriented to person time and place. Affect is normal. There is no jugular venous distention. Lungs are clear. Respiratory effort is nonlabored. Cardiac exam reveals S1 and S2. There no clicks or significant murmurs. The abdomen is soft. There is no peripheral edema. Filed Vitals:   01/25/12 1116  BP: 122/60  Pulse: 65  Height: 5\' 9"  (1.753 m)  Weight: 193 lb (87.544 kg)      ASSESSMENT & PLAN

## 2012-01-25 NOTE — Assessment & Plan Note (Signed)
Hemoglobin was checked at the time of his last visit in November, hemoglobin was 13.9. No further workup.

## 2012-01-25 NOTE — Assessment & Plan Note (Signed)
Coronary disease is stable. No further workup at this time. 

## 2012-01-26 NOTE — Progress Notes (Signed)
Quick Note:  Left a message for pt to return call. ______ 

## 2012-01-28 ENCOUNTER — Ambulatory Visit (INDEPENDENT_AMBULATORY_CARE_PROVIDER_SITE_OTHER): Payer: 59 | Admitting: Internal Medicine

## 2012-01-28 ENCOUNTER — Encounter: Payer: Self-pay | Admitting: Internal Medicine

## 2012-01-28 VITALS — BP 130/70 | HR 69 | Temp 98.1°F | Wt 191.0 lb

## 2012-01-28 DIAGNOSIS — IMO0001 Reserved for inherently not codable concepts without codable children: Secondary | ICD-10-CM

## 2012-01-28 DIAGNOSIS — E1165 Type 2 diabetes mellitus with hyperglycemia: Secondary | ICD-10-CM

## 2012-01-28 DIAGNOSIS — IMO0002 Reserved for concepts with insufficient information to code with codable children: Secondary | ICD-10-CM

## 2012-01-28 NOTE — Assessment & Plan Note (Signed)
Overall, patient's diabetes control has improved. We discussed avoiding episodes of hypoglycemia. He will keep a food log.   He understands to eat a snack before any strenuous physical activity.  We discussed adjusting short term insulin dose based upon carbohydrate intake.  Lab Results  Component Value Date   HGBA1C 7.3* 01/21/2012

## 2012-01-28 NOTE — Progress Notes (Signed)
Subjective:    Patient ID: [name] [name], male    DOB: 1946-02-14, 66 y.o.   MRN: [id number]  HPI  66 year old white male with history of coronary artery disease and type 2 diabetes uncontrolled for followup. Overall patient has been doing quite well. His dietary compliance is good and he is using his mealtime insulin as directed. He has rare episodes of hypoglycemia.  Review of Systems Negative for chest pain or shortness of breath  Past Medical History  Diagnosis Date  . Diabetes mellitus type II, uncontrolled   . OSA (obstructive sleep apnea)   . Hyperlipidemia   . RBBB (right bundle branch block)   . Depression     Bipolar  . Edema   . Hypertension   . Falling episodes     these have occurred in the past and again recurring 2011  . Spondylosis     C5-6, C6-7 MRI 2010  . CAD (coronary artery disease)     status post coronary artery bypass graft in 2000,status post cardiac cath in 2006, 2009 ....continued chest pain and SOB despite oral medication adjestments including Ranexa.  Catheter November 2009/ DES  Stent...11/26/2008 to distal  RCA leading to acute marginal.  Cath 01/26/2010 continued patentcy grafts and stent to RCA , Modest progression of distal disease...medical Rx/   . Hx of CABG     2000,  / one median sternotomy suture broken her chest x-ray November, 2010, no clinical significance  . Ejection fraction     normal (echo 07/16/2011)  . Palpitations     event recorder showed sinus rhythm  . Nephrolithiasis   . Restless leg   . Dizziness   . Shortness of breath     CPX April, 2011, mild functional limitation, no clear pulmonary or cardiac limitation, possible deconditioning and mild chronotropic incompetence( peak heart rate 130)  . Cerebral ischemia     MRI November, 2010, chronic microvascular ischemia  . Anemia     hemoglobin 7.4, iron deficiency, January, 2011, 2 unit transfusion, endoscopy normal, capsule endoscopy February, 2011 no small bowel  abnormalities.   Most likely source gastric erosions, followed by GI    History   Social History  . Marital Status: Married    Spouse Name: N/A    Number of Children: N/A  . Years of Education: N/A   Occupational History  . Not on file.   Social History Main Topics  . Smoking status: Never Smoker   . Smokeless tobacco: Never Used  . Alcohol Use: No  . Drug Use: No  . Sexually Active: Not on file   Other Topics Concern  . Not on file   Social History Narrative  . No narrative on file    Past Surgical History  Procedure Date  . Nasal septum surgery     UP3  . Coronary artery bypass graft     2000    Family History  Problem Relation Age of Onset  . Cancer Brother     pancreatic cancer  . Diabetes Brother     4   . Stroke Brother   . Diabetes Brother   . Cancer Brother   . Diabetes Brother   . Coronary artery disease Brother   . Diabetes Brother   . Hypothyroidism Brother   . Heart attack      Nephew    Allergies  Allergen Reactions  . Morphine     REACTION: hallucinations    Current Outpatient Prescriptions on File Prior  to Visit  Medication Sig Dispense Refill  . aspirin EC 81 MG tablet Take 81 mg by mouth daily.        . clopidogrel (PLAVIX) 75 MG tablet Take 75 mg by mouth daily.        . diclofenac sodium (VOLTAREN) 1 % GEL Apply 1 application topically 2 (two) times daily as needed. For pain to affected area       . ferrous sulfate 325 (65 FE) MG tablet Take 325 mg by mouth 2 (two) times daily.        . fish oil-omega-3 fatty acids 1000 MG capsule Take 2 g by mouth 2 (two) times daily.       . furosemide (LASIX) 20 MG tablet Take 20 mg by mouth daily.        Marland Kitchen gabapentin (NEURONTIN) 300 MG capsule Take 1 capsule (300 mg total) by mouth 3 (three) times daily.  90 capsule  5  . glucose blood (FREESTYLE LITE) test strip 1 each by Other route. Use as instructed to test blood sugar three times daily       . insulin glargine (LANTUS SOLOSTAR) 100  UNIT/ML injection Inject 40 Units into the skin daily.  3 mL  5  . insulin lispro (HUMALOG) 100 UNIT/ML injection Inject 10-25 Units into the skin 3 (three) times daily before meals. As directed; patient uses home sliding scale  3 mL  5  . Insulin Pen Needle (NOVOFINE) 30G X 8 MM MISC Inject 10 each into the skin as needed. Use for insulin injections three times a day  100 each  3  . isosorbide mononitrate (IMDUR) 120 MG 24 hr tablet Take 1 tablet (120 mg total) by mouth daily.  30 tablet  10  . levothyroxine (SYNTHROID, LEVOTHROID) 75 MCG tablet Take 75 mcg by mouth daily.        . metFORMIN (GLUCOPHAGE) 1000 MG tablet Take 1 tablet (1,000 mg total) by mouth 2 (two) times daily.  180 tablet  0  . nebivolol (BYSTOLIC) 10 MG tablet Take 10 mg by mouth daily.        . nitroGLYCERIN (NITROSTAT) 0.4 MG SL tablet Place 0.4 mg under the tongue every 5 (five) minutes as needed. For three doses; For chest pain       . ondansetron (ZOFRAN) 8 MG tablet Take 8 mg by mouth every 12 (twelve) hours as needed. For nausea      . pantoprazole (PROTONIX) 40 MG tablet Take 1 tablet (40 mg total) by mouth daily.  90 tablet  1  . pramipexole (MIRAPEX) 0.25 MG tablet Take 1 tablet (0.25 mg total) by mouth at bedtime. For restless legs  90 tablet  0  . sertraline (ZOLOFT) 100 MG tablet Take 1 tablet (100 mg total) by mouth 2 (two) times daily.  180 tablet  0  . simvastatin (ZOCOR) 40 MG tablet Take 1 tablet (40 mg total) by mouth at bedtime.  90 tablet  0    BP 130/70  Pulse 69  Temp(Src) 98.1 F (36.7 C) (Oral)  Wt 191 lb (86.637 kg)  SpO2 98%     Objective:   Physical Exam  Constitutional: He appears well-developed and well-nourished.  Cardiovascular: Normal rate, regular rhythm and normal heart sounds.   Pulmonary/Chest: Effort normal and breath sounds normal. He has no wheezes. He has no rales.  Musculoskeletal: He exhibits no edema.  Skin: Skin is warm and dry.  Psychiatric: He has a normal mood and  affect. His behavior is normal.          Assessment & Plan:

## 2012-01-28 NOTE — Patient Instructions (Signed)
Please complete the following lab tests before your next follow up appointment: BMET, A1c, microalb/cr ratio  - 250.00

## 2012-05-16 ENCOUNTER — Telehealth: Payer: Self-pay | Admitting: Internal Medicine

## 2012-05-16 NOTE — Telephone Encounter (Signed)
Pt called req to get samples on Lantus insulin and some Humalog? Pt says that he can not afford insulin through pharmacy.

## 2012-05-17 NOTE — Telephone Encounter (Signed)
Ok per Dr Shawna Orleans, pt aware to come p/u

## 2012-07-07 ENCOUNTER — Telehealth: Payer: Self-pay | Admitting: Internal Medicine

## 2012-07-07 NOTE — Telephone Encounter (Signed)
[name] [name] calling about insulin. Pt without insurance and unable to get any refills. Wanting refills again from the office to hold him for a while. RN lost connection with pt and unable to triage current situation. Left messages on pt number [phone].

## 2012-07-11 ENCOUNTER — Telehealth: Payer: Self-pay | Admitting: Internal Medicine

## 2012-07-11 NOTE — Telephone Encounter (Signed)
No lantus avaliable, pt is coming to pick up humalog

## 2012-07-11 NOTE — Telephone Encounter (Signed)
Do we a sample lantus solar star pen? Patient does not have insurance yet.

## 2012-07-12 NOTE — Telephone Encounter (Signed)
Advised pt we do not have any samples of lantus.

## 2012-07-30 ENCOUNTER — Inpatient Hospital Stay (HOSPITAL_COMMUNITY)
Admission: EM | Admit: 2012-07-30 | Discharge: 2012-08-03 | DRG: 287 | Disposition: A | Discharge status: Home / Self Care | Payer: Medicaid Other | Attending: Cardiology | Admitting: Cardiology

## 2012-07-30 ENCOUNTER — Emergency Department (HOSPITAL_COMMUNITY): Payer: Medicaid Other

## 2012-07-30 ENCOUNTER — Encounter (HOSPITAL_COMMUNITY): Payer: Self-pay | Admitting: *Deleted

## 2012-07-30 DIAGNOSIS — K219 Gastro-esophageal reflux disease without esophagitis: Secondary | ICD-10-CM | POA: Diagnosis present

## 2012-07-30 DIAGNOSIS — G2581 Restless legs syndrome: Secondary | ICD-10-CM | POA: Diagnosis present

## 2012-07-30 DIAGNOSIS — G4733 Obstructive sleep apnea (adult) (pediatric): Secondary | ICD-10-CM | POA: Diagnosis present

## 2012-07-30 DIAGNOSIS — Z91199 Patient's noncompliance with other medical treatment and regimen due to unspecified reason: Secondary | ICD-10-CM

## 2012-07-30 DIAGNOSIS — R002 Palpitations: Secondary | ICD-10-CM | POA: Diagnosis present

## 2012-07-30 DIAGNOSIS — I200 Unstable angina: Secondary | ICD-10-CM

## 2012-07-30 DIAGNOSIS — Z951 Presence of aortocoronary bypass graft: Secondary | ICD-10-CM

## 2012-07-30 DIAGNOSIS — I6789 Other cerebrovascular disease: Secondary | ICD-10-CM | POA: Diagnosis present

## 2012-07-30 DIAGNOSIS — Z7982 Long term (current) use of aspirin: Secondary | ICD-10-CM

## 2012-07-30 DIAGNOSIS — I4510 Unspecified right bundle-branch block: Secondary | ICD-10-CM | POA: Diagnosis present

## 2012-07-30 DIAGNOSIS — I959 Hypotension, unspecified: Secondary | ICD-10-CM | POA: Diagnosis not present

## 2012-07-30 DIAGNOSIS — Z9861 Coronary angioplasty status: Secondary | ICD-10-CM

## 2012-07-30 DIAGNOSIS — M47812 Spondylosis without myelopathy or radiculopathy, cervical region: Secondary | ICD-10-CM | POA: Diagnosis present

## 2012-07-30 DIAGNOSIS — E039 Hypothyroidism, unspecified: Secondary | ICD-10-CM | POA: Diagnosis present

## 2012-07-30 DIAGNOSIS — IMO0001 Reserved for inherently not codable concepts without codable children: Secondary | ICD-10-CM | POA: Diagnosis present

## 2012-07-30 DIAGNOSIS — I10 Essential (primary) hypertension: Secondary | ICD-10-CM | POA: Diagnosis present

## 2012-07-30 DIAGNOSIS — Z882 Allergy status to sulfonamides status: Secondary | ICD-10-CM

## 2012-07-30 DIAGNOSIS — M25519 Pain in unspecified shoulder: Secondary | ICD-10-CM | POA: Diagnosis present

## 2012-07-30 DIAGNOSIS — Z794 Long term (current) use of insulin: Secondary | ICD-10-CM

## 2012-07-30 DIAGNOSIS — E785 Hyperlipidemia, unspecified: Secondary | ICD-10-CM | POA: Diagnosis present

## 2012-07-30 DIAGNOSIS — Z9181 History of falling: Secondary | ICD-10-CM

## 2012-07-30 DIAGNOSIS — I209 Angina pectoris, unspecified: Secondary | ICD-10-CM | POA: Diagnosis present

## 2012-07-30 DIAGNOSIS — I2510 Atherosclerotic heart disease of native coronary artery without angina pectoris: Principal | ICD-10-CM | POA: Diagnosis present

## 2012-07-30 DIAGNOSIS — Z9119 Patient's noncompliance with other medical treatment and regimen: Secondary | ICD-10-CM

## 2012-07-30 DIAGNOSIS — F3130 Bipolar disorder, current episode depressed, mild or moderate severity, unspecified: Secondary | ICD-10-CM | POA: Diagnosis present

## 2012-07-30 DIAGNOSIS — Z8249 Family history of ischemic heart disease and other diseases of the circulatory system: Secondary | ICD-10-CM

## 2012-07-30 HISTORY — DX: Patient's other noncompliance with medication regimen: Z91.14

## 2012-07-30 HISTORY — DX: Patient's other noncompliance with medication regimen for other reason: Z91.148

## 2012-07-30 LAB — BASIC METABOLIC PANEL
Calcium: 9.7 mg/dL (ref 8.4–10.5)
Creatinine, Ser: 0.9 mg/dL (ref 0.50–1.35)
GFR calc Af Amer: >90 mL/min (ref >90)

## 2012-07-30 LAB — TROPONIN I: Troponin I: <0.3 ng/mL (ref <0.30)

## 2012-07-30 LAB — URINE MICROSCOPIC-ADD ON

## 2012-07-30 LAB — CBC
HCT: 43.8 % (ref 39.0–52.0)
MCHC: 36.9 g/dL — ABNORMAL HIGH (ref 30.0–36.0)
Platelets: 173 10*3/uL (ref 150–400)
RDW: 12.6 % (ref 11.5–15.5)
WBC: 7.5 10*3/uL (ref 4.0–10.5)

## 2012-07-30 LAB — URINALYSIS, ROUTINE W REFLEX MICROSCOPIC
Bilirubin Urine: NEGATIVE
Ketones, ur: NEGATIVE mg/dL
Nitrite: NEGATIVE
pH: 6 (ref 5.0–8.0)

## 2012-07-30 LAB — POCT I-STAT, CHEM 8
Glucose, Bld: 495 mg/dL — ABNORMAL HIGH (ref 70–99)
HCT: 49 % (ref 39.0–52.0)
Hemoglobin: 16.7 g/dL (ref 13.0–17.0)
Potassium: 4.8 mEq/L (ref 3.5–5.1)

## 2012-07-30 MED ORDER — ASPIRIN 81 MG PO CHEW
324.0000 mg | CHEWABLE_TABLET | Freq: Once | ORAL | Status: AC
  Administered 2012-07-30: 324 mg via ORAL
  Filled 2012-07-30: qty 4

## 2012-07-30 MED ORDER — HYDROCODONE-ACETAMINOPHEN 5-325 MG PO TABS
2.0000 | ORAL_TABLET | Freq: Once | ORAL | Status: AC
  Administered 2012-07-30: 2 via ORAL
  Filled 2012-07-30: qty 2

## 2012-07-30 MED ORDER — NITROGLYCERIN 0.4 MG SL SUBL: 0.4000 mg | SUBLINGUAL_TABLET | SUBLINGUAL | Status: DC | PRN

## 2012-07-30 NOTE — ED Notes (Signed)
To ED for eval of left side cp. States he has had this pain for a cple of weeks but has become worse. Hx of CABG. States he felt this same pain prior to CABG. Also had nausea, vomiting, and diaphoresis this am. Pt is a patient of Dr [name] with [facility].

## 2012-07-30 NOTE — H&P (Addendum)
Primary Cardiologist: Dr. Dola Argyle [facility])  Patient Location: ED, Pod B, Room 19  Chief Complaint: chest pain  HPI:  [name] [name] is a 66 year old gentleman with known coronary artery disease requiring surgical bypass grafting (2000) and subsequent RCA PCI, diabetes, obstructive sleep apnea, hypertension who presents with chest pain. He is accompanied in the ED by his daughter.   For the past few months, he has been experiencing regular episodes of left-sided chest pain which generally occur with exertion. The pain occasionally radiates to his left shoulder and to his jaw. They are associated with mild nausea and diaphoresis. The chest pain is also associated with lightheadedness and a feeling like he is going to pass out. In fact he had a fall while in the shower a few weeks ago in the setting of chest pain, resulting in a right shoulder injury which still continues to bother him. His episodic chest pain symptoms usually improve with rest within a few minutes. He sometimes taken sublingual nitroglycerine for these episodes which does provide some relief. Today, his chest pain occurred with activity and was more pronounced than usual. He also experienced fairly significant nausea and diaphoresis. He reports vomiting during today's episode.   He readily admits to not taking his prescribed medications due to insurance/financial barriers, though he has continued to take a daily aspirin. He takes insulin "occasionally." He denies PND/orthopnea/leg swelling.   During my interview in the ED, he expereinced paroxysms of chest pain lasting a few seconds, described as sharp.   Past Medical History  Diagnosis Date  . Diabetes mellitus type II, uncontrolled   . OSA (obstructive sleep apnea)   . Hyperlipidemia   . RBBB (right bundle branch block)   . Depression     Bipolar  . Edema   . Hypertension   . Falling episodes     these have occurred in the past and again recurring 2011  . Spondylosis       C5-6, C6-7 MRI 2010  . CAD (coronary artery disease)     status post coronary artery bypass graft in 2000,status post cardiac cath in 2006, 2009 ....continued chest pain and SOB despite oral medication adjestments including Ranexa.  Catheter November 2009/ DES  Stent...11/26/2008 to distal  RCA leading to acute marginal.  Cath 01/26/2010 continued patentcy grafts and stent to RCA , Modest progression of distal disease...medical Rx/   . Hx of CABG     2000,  / one median sternotomy suture broken her chest x-ray November, 2010, no clinical significance  . Ejection fraction     normal (echo 07/16/2011)  . Palpitations     event recorder showed sinus rhythm  . Nephrolithiasis   . Restless leg   . Dizziness   . Shortness of breath     CPX April, 2011, mild functional limitation, no clear pulmonary or cardiac limitation, possible deconditioning and mild chronotropic incompetence( peak heart rate 130)  . Cerebral ischemia     MRI November, 2010, chronic microvascular ischemia  . Anemia     hemoglobin 7.4, iron deficiency, January, 2011, 2 unit transfusion, endoscopy normal, capsule endoscopy February, 2011 no small bowel abnormalities.   Most likely source gastric erosions, followed by GI   Family History: CAD (brother) DM/CVA (brother)  Social History:  Lifelong non-smoker Does not drink EtOH Currently not employed Accompanied by daughter in the ED Married and lives with his wife  Review of Systems: As per HPI; otherwise is comprehensively negative  Allergies:  Allergies  Allergen Reactions  . Morphine     REACTION: hallucinations   Medications: Aspirin 81mg  Insulin lantus 40 units nightly He otherwise reports not taking his prescribed medications for the past few months  Exam  Afebrile  130/74  HR 68  RR 14  Pulse Ox 98% RA  Initially sleeping when I arrived; audibly snoring  No acute distress; speaking in full sentences with no use of accessory muscles  JVP 6 cm H2O;  no carotid bruits  Cardiac: S1 S2, regular without murmurs  Chest clear to auscultation bilaterally; no wheezes detected; mid-line sternal scar Abd soft, non-tender, non-distended  Ext warm, well-perfused with no edema  Neuro A&Ox3  Skin warm & dry  Labs: Trop 0.00 Na 134 K 4.8 Cl 98 CO2 25  BUN 18 Cr 1.0 Glucose 495 WBC 7.5 Hgb 16 Plts 173K Urine (+)glucose  ECG: sinus rhythm, RBBB, left axis deviation  CXR: no evident infiltrate, edema, or effusions  Assessment/Plan 66 year old gentleman with known coronary disease requiring prior CABG, poorly-controlled diabetes, and medication non-adherence who presents with chest pain concerning for unstable angina. While there is no electrocardiographic or enzymatic evidence for ischemia thus far, his history over the past few months is concerning for progressive angina and the potential for new, flow-limiting coronary or bypass graft disease. Of note, he has a history of normal stress tests, including one 5 days prior to his CABG. In light of his symptoms, revascularization history, and his stress test history, I would favor pursuit of invasive angiography and with potential revascularization depending on his coronary & bypass graft anatomy.   1) Unstable angina with history of known CAD - IV Heparin infusion with continued trending of his cardiac enzymes overnight - Anti-platelet therapy in the form of aspirin - Metoprolol started at 25mg  BID; SL NTG as needed - NPO after midnight for possible catheterization in the AM  2) Hypertension - Will initiate Metoprolol 25mg  BID for anti-anginal and anti-hypertensive effects - Given his diabetes, he would also benefit from ACE-Inhibition for protection against diabetic nephropathy. Will start Lisinopril 5 mg with further adjustments as necessary.   3) Diabetes, Type II - Will restart an insulin regimen. Will start Lantus 20 units (half of his reported home dose, since he will be NPO) together  with sliding scale coverage - Check a HA1C, which I suspect will be fairly high given his Rx non-adherence and his presenting glucose of nearly 500  I discussed my impressions with the patient. His questions were answered to the best of my ability.  Toya Smothers, MD Cardiology Fellow On-Call Pager [phone]  Addendum: Patient had a transient episode of hypotension at around 3am in the setting of SL NTG for chest discomfort and Zofran for nausea. No ECG changes. Now normotensive again and is currently chest pain free. Will d/c planned Lisinopril and will decrease Metoprolol dose from 25mg  BID to 12.5mg  BID.

## 2012-07-30 NOTE — ED Provider Notes (Signed)
History     CSN: [id number]  Arrival date & time 07/30/12  [id number]   First MD Initiated Contact with Patient 07/30/12 1905      Chief Complaint  Patient presents with  . Chest Pain    (Consider location/radiation/quality/duration/timing/severity/associated sxs/prior treatment) HPI Comments: Remote history of CABG present with left-sided chest pain that was there all morning associated with dizziness, lightheadedness and nausea. He's had this pain on and off for the past several months almost daily. He states he's been off of his heart medications including Plavix for the past 4 months. He took nitroglycerin today with mild improvement. Denies any abdominal pain, cough, fever, urinary symptoms. Denies any leg pain or swelling.  The history is provided by the patient and the spouse.    Past Medical History  Diagnosis Date  . Diabetes mellitus type II, uncontrolled   . OSA (obstructive sleep apnea)   . Hyperlipidemia   . RBBB (right bundle branch block)   . Depression     Bipolar  . Edema   . Hypertension   . Falling episodes     these have occurred in the past and again recurring 2011  . Spondylosis     C5-6, C6-7 MRI 2010  . CAD (coronary artery disease)     status post coronary artery bypass graft in 2000,status post cardiac cath in 2006, 2009 ....continued chest pain and SOB despite oral medication adjestments including Ranexa.  Catheter November 2009/ DES  Stent...11/26/2008 to distal  RCA leading to acute marginal.  Cath 01/26/2010 continued patentcy grafts and stent to RCA , Modest progression of distal disease...medical Rx/   . Hx of CABG     2000,  / one median sternotomy suture broken her chest x-ray November, 2010, no clinical significance  . Ejection fraction     normal (echo 07/16/2011)  . Palpitations     event recorder showed sinus rhythm  . Nephrolithiasis   . Restless leg   . Dizziness   . Shortness of breath     CPX April, 2011, mild functional limitation, no  clear pulmonary or cardiac limitation, possible deconditioning and mild chronotropic incompetence( peak heart rate 130)  . Cerebral ischemia     MRI November, 2010, chronic microvascular ischemia  . Anemia     hemoglobin 7.4, iron deficiency, January, 2011, 2 unit transfusion, endoscopy normal, capsule endoscopy February, 2011 no small bowel abnormalities.   Most likely source gastric erosions, followed by GI    Past Surgical History  Procedure Date  . Nasal septum surgery     UP3  . Coronary artery bypass graft     2000    Family History  Problem Relation Age of Onset  . Cancer Brother     pancreatic cancer  . Diabetes Brother     4   . Stroke Brother   . Diabetes Brother   . Cancer Brother   . Diabetes Brother   . Coronary artery disease Brother   . Diabetes Brother   . Hypothyroidism Brother   . Heart attack      Nephew    History  Substance Use Topics  . Smoking status: Never Smoker   . Smokeless tobacco: Never Used  . Alcohol Use: No      Review of Systems  Constitutional: Negative for fever, activity change and appetite change.  HENT: Negative for congestion and rhinorrhea.   Respiratory: Positive for chest tightness and shortness of breath. Negative for cough.   Cardiovascular: Positive  for chest pain.  Gastrointestinal: Negative for nausea, vomiting and abdominal pain.  Genitourinary: Negative for dysuria and hematuria.  Musculoskeletal: Negative for back pain.  Skin: Negative for rash.  Neurological: Positive for dizziness and light-headedness.    Allergies  Morphine  Home Medications   No current outpatient prescriptions on file.  BP 106/55  Pulse 52  Temp 97.4 F (36.3 C) (Oral)  Resp 17  Ht 5\' 9"  (1.753 m)  Wt 157 lb 10.1 oz (71.5 kg)  BMI 23.28 kg/m2  SpO2 96%  Physical Exam  Constitutional: He is oriented to person, place, and time. He appears well-developed and well-nourished. No distress.  HENT:  Head: Normocephalic and  atraumatic.  Mouth/Throat: Oropharynx is clear and moist. No oropharyngeal exudate.  Eyes: Conjunctivae are normal. Pupils are equal, round, and reactive to light.  Neck: Normal range of motion. Neck supple.  Cardiovascular: Normal rate, regular rhythm and normal heart sounds.   Pulmonary/Chest: Effort normal and breath sounds normal. No respiratory distress.  Abdominal: Soft. There is no tenderness. There is no rebound and no guarding.  Musculoskeletal: Normal range of motion. He exhibits no edema and no tenderness.  Neurological: He is alert and oriented to person, place, and time. No cranial nerve deficit.  Skin: Skin is warm.    ED Course  Procedures (including critical care time)  Labs Reviewed  CBC - Abnormal; Notable for the following:    MCHC 36.9 (*)     All other components within normal limits  POCT I-STAT, CHEM 8 - Abnormal; Notable for the following:    Sodium 134 (*)     Glucose, Bld 495 (*)     All other components within normal limits  BASIC METABOLIC PANEL - Abnormal; Notable for the following:    Sodium 133 (*)     Glucose, Bld 432 (*)     GFR calc non Af Amer 89 (*)     All other components within normal limits  URINALYSIS, ROUTINE W REFLEX MICROSCOPIC - Abnormal; Notable for the following:    APPearance CLOUDY (*)     Glucose, UA >1000 (*)     All other components within normal limits  LIPID PANEL - Abnormal; Notable for the following:    Cholesterol 218 (*)     Triglycerides 264 (*)     HDL 32 (*)     VLDL 53 (*)     LDL Cholesterol 133 (*)     All other components within normal limits  GLUCOSE, CAPILLARY - Abnormal; Notable for the following:    Glucose-Capillary 518 (*)     All other components within normal limits  BASIC METABOLIC PANEL - Abnormal; Notable for the following:    Glucose, Bld 192 (*)     GFR calc non Af Amer 85 (*)     All other components within normal limits  HEPARIN LEVEL (UNFRACTIONATED) - Abnormal; Notable for the following:      Heparin Unfractionated <0.10 (*)     All other components within normal limits  GLUCOSE, CAPILLARY - Abnormal; Notable for the following:    Glucose-Capillary 195 (*)     All other components within normal limits  TROPONIN I  POCT I-STAT TROPONIN I  URINE MICROSCOPIC-ADD ON  MRSA PCR SCREENING  CARDIAC PANEL(CRET KIN+CKTOT+MB+TROPI)  CARDIAC PANEL(CRET KIN+CKTOT+MB+TROPI)  MAGNESIUM  PROTIME-INR  CARDIAC PANEL(CRET KIN+CKTOT+MB+TROPI)  HEMOGLOBIN A1C  TSH  HEPATIC FUNCTION PANEL   Dg Chest 2 View  07/30/2012  *RADIOLOGY REPORT*  Clinical Data:  Chest pain and shortness of breath.  CHEST - 2 VIEW  Comparison: 10/19/2011.  Findings: The cardiac silhouette, mediastinal and hilar contours are within normal limits and stable. Stable surgical changes from triple bypass surgery.  The lungs are clear of acute process. Minimal left basilar scarring changes.  No pleural effusion.  The bony thorax is intact.  IMPRESSION: No acute cardiopulmonary findings.   Original Report Authenticated By: P. Kalman Jewels, [name]      1. Unstable angina       MDM  Left-sided chest pain on exertion associated with shortness of breath nausea and diaphoresis. Concerning for unstable angina.  EKG unchanged from previous, troponin negative. Hyperglycemia without DKA.  IV nitro, heparin, ASA.  D/w Dr. Nira Retort who will admit patient for Dr. [name].   Date: 07/30/2012  Rate: 82  Rhythm: normal sinus rhythm  QRS Axis: left  Intervals: normal  ST/T Wave abnormalities: normal  Conduction Disutrbances:right bundle branch block  Narrative Interpretation:   Old EKG Reviewed: unchanged  CRITICAL CARE Performed by: Ezequiel Essex   Total critical care time: 30  Critical care time was exclusive of separately billable procedures and treating other patients.  Critical care was necessary to treat or prevent imminent or life-threatening deterioration.  Critical care was time spent personally by me on the  following activities: development of treatment plan with patient and/or surrogate as well as nursing, discussions with consultants, evaluation of patient's response to treatment, examination of patient, obtaining history from patient or surrogate, ordering and performing treatments and interventions, ordering and review of laboratory studies, ordering and review of radiographic studies, pulse oximetry and re-evaluation of patient's condition.       Ezequiel Essex, MD 07/31/12 1125

## 2012-07-31 ENCOUNTER — Other Ambulatory Visit: Payer: Self-pay

## 2012-07-31 DIAGNOSIS — R072 Precordial pain: Secondary | ICD-10-CM

## 2012-07-31 DIAGNOSIS — I200 Unstable angina: Secondary | ICD-10-CM

## 2012-07-31 LAB — BASIC METABOLIC PANEL
CO2: 29 mEq/L (ref 19–32)
Chloride: 103 mEq/L (ref 96–112)
Glucose, Bld: 192 mg/dL — ABNORMAL HIGH (ref 70–99)
Potassium: 4.7 mEq/L (ref 3.5–5.1)
Sodium: 139 mEq/L (ref 135–145)

## 2012-07-31 LAB — HEPATIC FUNCTION PANEL
ALT: 14 U/L (ref 0–53)
AST: 15 U/L (ref 0–37)
Albumin: 3.9 g/dL (ref 3.5–5.2)
Alkaline Phosphatase: 85 U/L (ref 39–117)
Total Bilirubin: 0.5 mg/dL (ref 0.3–1.2)

## 2012-07-31 LAB — HEMOGLOBIN A1C
Hgb A1c MFr Bld: 11.9 % — ABNORMAL HIGH (ref <5.7)
Mean Plasma Glucose: 295 mg/dL — ABNORMAL HIGH (ref <117)

## 2012-07-31 LAB — GLUCOSE, CAPILLARY: Glucose-Capillary: 195 mg/dL — ABNORMAL HIGH (ref 70–99)

## 2012-07-31 LAB — LIPID PANEL
Cholesterol: 218 mg/dL — ABNORMAL HIGH (ref 0–200)
HDL: 32 mg/dL — ABNORMAL LOW (ref >39)
LDL Cholesterol: 133 mg/dL — ABNORMAL HIGH (ref 0–99)
Triglycerides: 264 mg/dL — ABNORMAL HIGH (ref <150)

## 2012-07-31 LAB — PROTIME-INR
INR: 0.98 (ref 0.00–1.49)
Prothrombin Time: 13.2 seconds (ref 11.6–15.2)

## 2012-07-31 LAB — CARDIAC PANEL(CRET KIN+CKTOT+MB+TROPI)
CK, MB: 1.4 ng/mL (ref 0.3–4.0)
CK, MB: 1.3 ng/mL (ref 0.3–4.0)
Total CK: 29 U/L (ref 7–232)
Troponin I: <0.3 ng/mL (ref <0.30)
Troponin I: <0.3 ng/mL (ref <0.30)

## 2012-07-31 LAB — HEPARIN LEVEL (UNFRACTIONATED): Heparin Unfractionated: <0.1 IU/mL — ABNORMAL LOW (ref 0.30–0.70)

## 2012-07-31 MED ORDER — LEVOTHYROXINE SODIUM 75 MCG PO TABS
75.0000 ug | ORAL_TABLET | Freq: Every day | ORAL | Status: DC
  Administered 2012-07-31 – 2012-08-03 (×3): 75 ug via ORAL
  Filled 2012-07-31 (×6): qty 1

## 2012-07-31 MED ORDER — INSULIN ASPART 100 UNIT/ML ~~LOC~~ SOLN
0.0000 [IU] | Freq: Every day | SUBCUTANEOUS | Status: DC
  Administered 2012-07-31: 2 [IU] via SUBCUTANEOUS

## 2012-07-31 MED ORDER — ASPIRIN 81 MG PO CHEW: 324.0000 mg | CHEWABLE_TABLET | ORAL | Status: DC

## 2012-07-31 MED ORDER — HEPARIN (PORCINE) IN NACL 100-0.45 UNIT/ML-% IJ SOLN
1300.0000 [IU]/h | INTRAMUSCULAR | Status: DC
  Administered 2012-07-31 (×2): 1050 [IU]/h via INTRAVENOUS
  Administered 2012-08-01: 1300 [IU]/h via INTRAVENOUS
  Filled 2012-07-31 (×3): qty 250

## 2012-07-31 MED ORDER — ONDANSETRON HCL 4 MG/2ML IJ SOLN
4.0000 mg | Freq: Four times a day (QID) | INTRAMUSCULAR | Status: DC | PRN
  Administered 2012-07-31: 4 mg via INTRAVENOUS
  Filled 2012-07-31: qty 2

## 2012-07-31 MED ORDER — ATORVASTATIN CALCIUM 40 MG PO TABS
40.0000 mg | ORAL_TABLET | Freq: Every day | ORAL | Status: DC
  Administered 2012-07-31 – 2012-08-02 (×3): 40 mg via ORAL
  Filled 2012-07-31 (×5): qty 1

## 2012-07-31 MED ORDER — ASPIRIN EC 81 MG PO TBEC
81.0000 mg | DELAYED_RELEASE_TABLET | Freq: Every day | ORAL | Status: DC
  Administered 2012-08-02 – 2012-08-03 (×2): 81 mg via ORAL
  Filled 2012-07-31 (×4): qty 1

## 2012-07-31 MED ORDER — INSULIN GLARGINE 100 UNIT/ML ~~LOC~~ SOLN
20.0000 [IU] | Freq: Every day | SUBCUTANEOUS | Status: DC
  Administered 2012-07-31 (×2): 20 [IU] via SUBCUTANEOUS

## 2012-07-31 MED ORDER — LISINOPRIL 5 MG PO TABS
5.0000 mg | ORAL_TABLET | Freq: Every day | ORAL | Status: DC
  Filled 2012-07-31: qty 1

## 2012-07-31 MED ORDER — NITROGLYCERIN 0.4 MG SL SUBL
0.4000 mg | SUBLINGUAL_TABLET | SUBLINGUAL | Status: DC | PRN
  Administered 2012-07-31: 0.4 mg via SUBLINGUAL
  Filled 2012-07-31: qty 75

## 2012-07-31 MED ORDER — SODIUM CHLORIDE 0.9 % IV SOLN
Freq: Once | INTRAVENOUS | Status: AC
  Administered 2012-07-31: 250 mL via INTRAVENOUS

## 2012-07-31 MED ORDER — INSULIN ASPART 100 UNIT/ML ~~LOC~~ SOLN: 5.0000 [IU] | Freq: Once | SUBCUTANEOUS | Status: DC

## 2012-07-31 MED ORDER — METOPROLOL TARTRATE 12.5 MG HALF TABLET
12.5000 mg | ORAL_TABLET | Freq: Two times a day (BID) | ORAL | Status: DC
  Administered 2012-07-31 – 2012-08-03 (×5): 12.5 mg via ORAL
  Filled 2012-07-31 (×10): qty 1

## 2012-07-31 MED ORDER — SODIUM CHLORIDE 0.9 % IV SOLN
INTRAVENOUS | Status: DC
  Administered 2012-07-31 (×2): via INTRAVENOUS
  Administered 2012-08-01: 50 mL/h via INTRAVENOUS

## 2012-07-31 MED ORDER — ASPIRIN 300 MG RE SUPP: 300.0000 mg | RECTAL | Status: DC

## 2012-07-31 MED ORDER — OMEGA-3-ACID ETHYL ESTERS 1 G PO CAPS
2.0000 g | ORAL_CAPSULE | Freq: Two times a day (BID) | ORAL | Status: DC
  Administered 2012-07-31 – 2012-08-03 (×7): 2 g via ORAL
  Filled 2012-07-31 (×10): qty 2

## 2012-07-31 MED ORDER — HEPARIN BOLUS VIA INFUSION
4000.0000 [IU] | Freq: Once | INTRAVENOUS | Status: AC
  Administered 2012-07-31: 4000 [IU] via INTRAVENOUS
  Filled 2012-07-31: qty 4000

## 2012-07-31 MED ORDER — GABAPENTIN 300 MG PO CAPS
300.0000 mg | ORAL_CAPSULE | Freq: Three times a day (TID) | ORAL | Status: DC
  Administered 2012-07-31 – 2012-08-03 (×10): 300 mg via ORAL
  Filled 2012-07-31 (×14): qty 1

## 2012-07-31 MED ORDER — INSULIN ASPART 100 UNIT/ML ~~LOC~~ SOLN
8.0000 [IU] | Freq: Once | SUBCUTANEOUS | Status: AC
  Administered 2012-07-31: 8 [IU] via SUBCUTANEOUS

## 2012-07-31 MED ORDER — SERTRALINE HCL 100 MG PO TABS
100.0000 mg | ORAL_TABLET | Freq: Two times a day (BID) | ORAL | Status: DC
  Administered 2012-07-31 – 2012-08-03 (×7): 100 mg via ORAL
  Filled 2012-07-31 (×10): qty 1

## 2012-07-31 MED ORDER — TRAMADOL HCL 50 MG PO TABS
50.0000 mg | ORAL_TABLET | Freq: Four times a day (QID) | ORAL | Status: DC | PRN
  Filled 2012-07-31: qty 1

## 2012-07-31 MED ORDER — ASPIRIN 81 MG PO CHEW
324.0000 mg | CHEWABLE_TABLET | ORAL | Status: AC
  Administered 2012-08-01: 324 mg via ORAL
  Filled 2012-07-31: qty 4

## 2012-07-31 MED ORDER — ACETAMINOPHEN 325 MG PO TABS
650.0000 mg | ORAL_TABLET | ORAL | Status: DC | PRN
  Administered 2012-07-31 – 2012-08-02 (×2): 650 mg via ORAL
  Filled 2012-07-31 (×2): qty 2

## 2012-07-31 MED ORDER — PANTOPRAZOLE SODIUM 40 MG PO TBEC
40.0000 mg | DELAYED_RELEASE_TABLET | Freq: Every day | ORAL | Status: DC
  Administered 2012-07-31 – 2012-08-03 (×4): 40 mg via ORAL
  Filled 2012-07-31 (×4): qty 1

## 2012-07-31 MED ORDER — DIAZEPAM 5 MG PO TABS
5.0000 mg | ORAL_TABLET | ORAL | Status: AC
  Administered 2012-08-01: 5 mg via ORAL
  Filled 2012-07-31: qty 1

## 2012-07-31 MED ORDER — PRAMIPEXOLE DIHYDROCHLORIDE 0.25 MG PO TABS
0.2500 mg | ORAL_TABLET | Freq: Every day | ORAL | Status: DC
  Administered 2012-07-31 – 2012-08-02 (×3): 0.25 mg via ORAL
  Filled 2012-07-31 (×5): qty 1

## 2012-07-31 MED ORDER — METOPROLOL TARTRATE 25 MG PO TABS
25.0000 mg | ORAL_TABLET | Freq: Two times a day (BID) | ORAL | Status: DC
  Administered 2012-07-31: 25 mg via ORAL
  Filled 2012-07-31 (×3): qty 1

## 2012-07-31 MED ORDER — INSULIN ASPART 100 UNIT/ML ~~LOC~~ SOLN
0.0000 [IU] | Freq: Three times a day (TID) | SUBCUTANEOUS | Status: DC
  Administered 2012-07-31: 3 [IU] via SUBCUTANEOUS
  Administered 2012-07-31: 8 [IU] via SUBCUTANEOUS
  Administered 2012-07-31: 3 [IU] via SUBCUTANEOUS
  Administered 2012-08-01: 5 [IU] via SUBCUTANEOUS
  Administered 2012-08-01 – 2012-08-02 (×2): 11 [IU] via SUBCUTANEOUS
  Administered 2012-08-02 – 2012-08-03 (×2): 5 [IU] via SUBCUTANEOUS
  Administered 2012-08-03: 2 [IU] via SUBCUTANEOUS

## 2012-07-31 MED ORDER — HEPARIN (PORCINE) IN NACL 100-0.45 UNIT/ML-% IJ SOLN
850.0000 [IU]/h | INTRAMUSCULAR | Status: DC
  Administered 2012-07-31: 850 [IU]/h via INTRAVENOUS
  Filled 2012-07-31: qty 250

## 2012-07-31 MED ORDER — HEPARIN BOLUS VIA INFUSION
2000.0000 [IU] | Freq: Once | INTRAVENOUS | Status: AC
  Administered 2012-07-31: 2000 [IU] via INTRAVENOUS
  Filled 2012-07-31: qty 2000

## 2012-07-31 MED ORDER — OMEGA-3 FATTY ACIDS 1000 MG PO CAPS: 2.0000 g | ORAL_CAPSULE | Freq: Two times a day (BID) | ORAL | Status: DC

## 2012-07-31 NOTE — Progress Notes (Signed)
  Echocardiogram 2D Echocardiogram has been performed.  Basilia Jumbo 07/31/2012, 3:13 PM

## 2012-07-31 NOTE — Progress Notes (Signed)
ANTICOAGULATION CONSULT NOTE - Initial Consult  Pharmacy Consult for heparin Indication: chest pain/ACS  Allergies  Allergen Reactions  . Morphine     REACTION: hallucinations    Patient Measurements: Height: 5\' 9"  (175.3 cm) Weight: 157 lb 10.1 oz (71.5 kg) IBW/kg (Calculated) : 70.7  Heparin Dosing Weight: 72 kg   Vital Signs: Temp: 97.7 F (36.5 C) (08/25 2345) Temp src: Oral (08/25 2345) BP: 128/59 mmHg (08/25 2345) Pulse Rate: 68  (08/25 2345)  Labs:  Basename 07/30/12 2035 07/30/12 2034 07/30/12 1911 07/30/12 1900  HGB -- -- 16.7 16.2  HCT -- -- 49.0 43.8  PLT -- -- -- 173  APTT -- -- -- --  LABPROT -- -- -- --  INR -- -- -- --  HEPARINUNFRC -- -- -- --  CREATININE -- 0.90 1.00 --  CKTOTAL -- -- -- --  CKMB -- -- -- --  TROPONINI <0.30 -- -- --    Estimated Creatinine Clearance: 84 ml/min (by C-G formula based on Cr of 0.9).   Medical History: Past Medical History  Diagnosis Date  . Diabetes mellitus type II, uncontrolled   . OSA (obstructive sleep apnea)   . Hyperlipidemia   . RBBB (right bundle branch block)   . Depression     Bipolar  . Edema   . Hypertension   . Falling episodes     these have occurred in the past and again recurring 2011  . Spondylosis     C5-6, C6-7 MRI 2010  . CAD (coronary artery disease)     status post coronary artery bypass graft in 2000,status post cardiac cath in 2006, 2009 ....continued chest pain and SOB despite oral medication adjestments including Ranexa.  Catheter November 2009/ DES  Stent...11/26/2008 to distal  RCA leading to acute marginal.  Cath 01/26/2010 continued patentcy grafts and stent to RCA , Modest progression of distal disease...medical Rx/   . Hx of CABG     2000,  / one median sternotomy suture broken her chest x-ray November, 2010, no clinical significance  . Ejection fraction     normal (echo 07/16/2011)  . Palpitations     event recorder showed sinus rhythm  . Nephrolithiasis   . Restless leg    . Dizziness   . Shortness of breath     CPX April, 2011, mild functional limitation, no clear pulmonary or cardiac limitation, possible deconditioning and mild chronotropic incompetence( peak heart rate 130)  . Cerebral ischemia     MRI November, 2010, chronic microvascular ischemia  . Anemia     hemoglobin 7.4, iron deficiency, January, 2011, 2 unit transfusion, endoscopy normal, capsule endoscopy February, 2011 no small bowel abnormalities.   Most likely source gastric erosions, followed by GI    Medications:  Scheduled:    . aspirin  324 mg Oral Once  . aspirin  324 mg Oral NOW   Or  . aspirin  300 mg Rectal NOW  . aspirin EC  81 mg Oral Daily  . atorvastatin  40 mg Oral q1800  . HYDROcodone-acetaminophen  2 tablet Oral Once  . insulin aspart  0-15 Units Subcutaneous TID WC  . insulin aspart  0-5 Units Subcutaneous QHS  . insulin glargine  20 Units Subcutaneous QHS  . lisinopril  5 mg Oral Daily  . metoprolol tartrate  25 mg Oral BID    Assessment: 66 yo male with h/o CABG (2000) admitted with r/o ACS. Pharmacy consulted to manage heparin.   Goal of Therapy:  Heparin level 0.3-0.7 units/ml Monitor platelets by anticoagulation protocol: Yes   Plan:  1. Heparin IV bolus of 4000 units x 1, then 850 units/hr.  2. Heparin level in 6 hours.  3. Daily CBC, heparin level  Otila Back 07/31/2012,12:24 AM

## 2012-07-31 NOTE — Progress Notes (Addendum)
Pt c/o chest pain 3/10; states it is like the pain that brought him to the hospital, but not as intense; EKG obtained and compared to previous EKG; no changes noted;0233  one sublingual NTG 0.4mg  tab given; after 5 minutes pt states that pain is goine; will continue to monitor; 0245  pt became nauseated; BP dropped from 107/61 to 47/30 after SL NTG; Zofran 4mg  IV given for nausea; Dr. Nira Retort page

## 2012-07-31 NOTE — Progress Notes (Signed)
Cardiology Progress Note Patient Name: [name] [name] Date of Encounter: 07/31/2012, 8:16 AM     Subjective  Patient had episode of chest pain early this morning requiring NTG. He then developed hypotension resolved with IVF. Denies chest pain or sob this morning. Patient reports he has not been taking any of his medications except insulin, ASA, bystoilc and PRN lasix, for the last 3-46mos due to loss of insurance.   Objective   Telemetry: Sinus rhythm 40-80s  Medications: . sodium chloride   Intravenous Once  . aspirin  324 mg Oral Once  . aspirin EC  81 mg Oral Daily  . atorvastatin  40 mg Oral q1800  . heparin  2,000 Units Intravenous Once  . heparin  4,000 Units Intravenous Once  . HYDROcodone-acetaminophen  2 tablet Oral Once  . insulin aspart  0-15 Units Subcutaneous TID WC  . insulin aspart  0-5 Units Subcutaneous QHS  . insulin aspart  8 Units Subcutaneous Once  . insulin glargine  20 Units Subcutaneous QHS  . metoprolol tartrate  12.5 mg Oral BID  . DISCONTD: lisinopril  5 mg Oral Daily  . DISCONTD: metoprolol tartrate  25 mg Oral BID   . sodium chloride 10 mL/hr at 07/31/12 0130  . heparin 1,050 Units/hr (07/31/12 0815)    Physical Exam: Temp:  [97.4 F (36.3 C)-98.2 F (36.8 C)] 97.4 F (36.3 C) (08/26 0756) Pulse Rate:  [51-84] 63  (08/26 0345) Resp:  [9-18] 18  (08/25 2345) BP: (74-148)/(48-84) 100/56 mmHg (08/26 0345) SpO2:  [94 %-99 %] 95 % (08/26 0345) Weight:  [157 lb 10.1 oz (71.5 kg)] 157 lb 10.1 oz (71.5 kg) (08/26 0000)  General: Well developed white male, in no acute distress. Head: Normocephalic, atraumatic, sclera non-icteric, nares are without discharge.  Neck: Supple. Negative for carotid bruits or JVD Lungs: Clear bilaterally to auscultation without wheezes, rales, or rhonchi. Breathing is unlabored. Heart: RRR S1 S2 without murmurs, rubs, or gallops.  Abdomen: Soft, non-tender, non-distended with normoactive bowel sounds. No  rebound/guarding. No obvious abdominal masses. Msk:  Strength and tone appear normal for age. Extremities: No edema. No clubbing or cyanosis. Distal pedal pulses are intact and equal bilaterally. Neuro: Alert and oriented X 3. Moves all extremities spontaneously. Psych:  Responds to questions appropriately with a normal affect.   Intake/Output Summary (Last 24 hours) at 07/31/12 0816 Last data filed at 07/31/12 0305  Gross per 24 hour  Intake 281.59 ml  Output      0 ml  Net 281.59 ml    Labs:  [facility] 07/31/12 0608 07/31/12 0028 07/30/12 2034  NA 139 -- 133*  K 4.7 -- 4.2  CL 103 -- 96  CO2 29 -- 23  GLUCOSE 192* -- 432*  BUN 17 -- 16  CREATININE 0.99 -- 0.90  CALCIUM 10.0 -- 9.7  MG -- 2.1 --   Basename 07/30/12 1911 07/30/12 1900  WBC -- 7.5  HGB 16.7 16.2  HCT 49.0 43.8  MCV -- 87.8  PLT -- 173   Basename 07/31/12 0608 07/31/12 0028 07/30/12 2035  CKTOTAL 29 29 --  CKMB 1.4 1.4 --  TROPONINI <0.30 <0.30 <0.30   Basename 07/31/12 0608  CHOL 218*  HDL 32*  LDLCALC 133*  TRIG 264*  CHOLHDL 6.8   Radiology/Studies:   8/25/201 - Chest 2 View Findings: The cardiac silhouette, mediastinal and hilar contours are within normal limits and stable. Stable surgical changes from triple bypass surgery.  The lungs are clear of acute process. Minimal left basilar scarring changes.  No pleural effusion.  The bony thorax is intact.  IMPRESSION: No acute cardiopulmonary findings.        Assessment and Plan   1. Unstable Angina: Patient has history of CAD with CABG and PCI. He has been off of his medications including Plavix, Imdur, and Zocor for 3-87mos due to loss of insurance. He presents with progressive chest pain concerning for unstable angina. Cardiac enzymes normal and EKG without acute ischemic changes. Given his symptoms and cardiac history he will need further ischemic evaluation with cardiac cath, however, given his significant hyperglycemia will hydrate and  normalize his blood glucose and plan for cath tomorrow morning unless he becomes unstable. Obtain echo today to assess LV function. Hydrate with IVF @ 57ml/hr. Cont IV heparin, ASA, BB, statin, PRN NTG. Resume home Imdur once off NTG drip.  2. Coronary Artery Disease: H/o CABG 2000, DES to RCA '09. Cath 01/2010 with patent grafts/stent and modest progression of distal disease. Plan as above.   3. Diabetes Mellitus, Type 2: Uncontrolled. Blood glucose >500 on admission. Was getting samples of his insulin, but was not taking metformin. Will hold metformin for cath. Restarted on Lantus and placed on SSI. A1C pending.   4. Hypertension: Was taking Bystolic 10mg  daily at home. Started on Lisinopril and Metoprolol on admission. Episode of hypotension last night with NTG. Lisinopril stopped and Metoprolol decreased. BP stable.  5. Hyperlipidemia: No statin for the last 3-105mos. LDL 133. Restarted on statin and Fish oil. Check LFTs. Will need f/u LFTs/Lipid panel in 6-8wks.  6. Hypothyroidism: No Synthroid for the last 3-52mos. Was on 66mcg daily. Discussed with pharmacist and will resume home dose and check TSH.  7. Depression: No Zoloft for the last 3-61mos. Will resume.  8. GERD: Restart Protonix  9. Medication Noncompliance: Lost medical insurance about 3-79mos ago. Has been unable to afford most of his medications. Will have Medicare in December. Consult for case management to assist with medications at discharge until Medicare coverage begins.    Signed, HOPE, JESSICA PA-C  Patient seen and examined.  He has not been taking a lot of his medications due to cost.  Plan cath study tomorrow.  Patient agreeable.

## 2012-07-31 NOTE — Progress Notes (Signed)
INITIAL ADULT NUTRITION ASSESSMENT Date: 07/31/2012   Time: 11:47 AM Reason for Assessment: Malnutrition Screening Tool, score 4  ASSESSMENT: Male 66 y.o.  Dx: Chest Pain   Hx:  Past Medical History  Diagnosis Date  . Diabetes mellitus type II, uncontrolled   . OSA (obstructive sleep apnea)   . Hyperlipidemia   . RBBB (right bundle branch block)   . Depression     Bipolar  . Edema   . Hypertension   . Falling episodes     these have occurred in the past and again recurring 2011  . Spondylosis     C5-6, C6-7 MRI 2010  . CAD (coronary artery disease)     status post coronary artery bypass graft in 2000,status post cardiac cath in 2006, 2009 ....continued chest pain and SOB despite oral medication adjestments including Ranexa.  Catheter November 2009/ DES  Stent...11/26/2008 to distal  RCA leading to acute marginal.  Cath 01/26/2010 continued patentcy grafts and stent to RCA , Modest progression of distal disease...medical Rx/   . Hx of CABG     2000,  / one median sternotomy suture broken her chest x-ray November, 2010, no clinical significance  . Ejection fraction     normal (echo 07/16/2011)  . Palpitations     event recorder showed sinus rhythm  . Nephrolithiasis   . Restless leg   . Dizziness   . Shortness of breath     CPX April, 2011, mild functional limitation, no clear pulmonary or cardiac limitation, possible deconditioning and mild chronotropic incompetence( peak heart rate 130)  . Cerebral ischemia     MRI November, 2010, chronic microvascular ischemia  . Anemia     hemoglobin 7.4, iron deficiency, January, 2011, 2 unit transfusion, endoscopy normal, capsule endoscopy February, 2011 no small bowel abnormalities.   Most likely source gastric erosions, followed by GI    Related Meds:     . sodium chloride   Intravenous Once  . aspirin  324 mg Oral Once  . aspirin EC  81 mg Oral Daily  . atorvastatin  40 mg Oral q1800  . gabapentin  300 mg Oral TID  . heparin   2,000 Units Intravenous Once  . heparin  4,000 Units Intravenous Once  . HYDROcodone-acetaminophen  2 tablet Oral Once  . insulin aspart  0-15 Units Subcutaneous TID WC  . insulin aspart  0-5 Units Subcutaneous QHS  . insulin aspart  8 Units Subcutaneous Once  . insulin glargine  20 Units Subcutaneous QHS  . levothyroxine  75 mcg Oral QAC breakfast  . metoprolol tartrate  12.5 mg Oral BID  . omega-3 acid ethyl esters  2 g Oral BID  . pantoprazole  40 mg Oral Q1200  . pramipexole  0.25 mg Oral QHS  . sertraline  100 mg Oral BID  . DISCONTD: aspirin  324 mg Oral NOW  . DISCONTD: aspirin  300 mg Rectal NOW  . DISCONTD: fish oil-omega-3 fatty acids  2 g Oral BID  . DISCONTD: insulin aspart  5 Units Subcutaneous Once  . DISCONTD: lisinopril  5 mg Oral Daily  . DISCONTD: metoprolol tartrate  25 mg Oral BID     Ht: 5\' 9"  (175.3 cm)  Wt: 157 lb 10.1 oz (71.5 kg)  Ideal Wt: 72.7 kg  % Ideal Wt: 98%  Usual Wt:  Wt Readings from Last 5 Encounters:  07/31/12 157 lb 10.1 oz (71.5 kg)  01/28/12 191 lb (86.637 kg)  01/25/12 193 lb (87.544 kg)  10/26/11 184 lb (83.462 kg)  10/20/11 182 lb 8.7 oz (82.8 kg)     % Usual Wt: 81%  Body mass index is 23.28 kg/(m^2). Pt is WNL based on current BMI   Food/Nutrition Related Hx: Pt reports weight loss and poor appetite PTA per malnutrition screening tool  Labs:  CMP     Component Value Date/Time   NA 139 07/31/2012 0608   K 4.7 07/31/2012 0608   CL 103 07/31/2012 0608   CO2 29 07/31/2012 0608   GLUCOSE 192* 07/31/2012 0608   BUN 17 07/31/2012 0608   CREATININE 0.99 07/31/2012 0608   CALCIUM 10.0 07/31/2012 0608   PROT 7.1 10/19/2011 2340   ALBUMIN 4.1 10/19/2011 2340   AST 18 10/19/2011 2340   ALT 20 10/19/2011 2340   ALKPHOS 64 10/19/2011 2340   BILITOT 0.4 10/19/2011 2340   GFRNONAA 85* 07/31/2012 0608   GFRAA >90 07/31/2012 0608   CBG (last 3)   Basename 07/31/12 0759 07/31/12 0018  GLUCAP 195* 518*     Lab Results  Component  Value Date   HGBA1C 7.3* 01/21/2012    Intake/Output Summary (Last 24 hours) at 07/31/12 1150 Last data filed at 07/31/12 1100  Gross per 24 hour  Intake 433.59 ml  Output      0 ml  Net 433.59 ml     Diet Order: Carb Control  Supplements/Tube Feeding: none   IVF:    sodium chloride Last Rate: 50 mL/hr at 07/31/12 1039  heparin Last Rate: 1,050 Units/hr (07/31/12 0815)  DISCONTD: heparin Last Rate: 850 Units/hr (07/31/12 0117)    Estimated Nutritional Needs:   Kcal: 1800-2000 Protein: 75-85 gm  Fluid: 1.8-2 L   Per notes pt with elevated BS on admission, no new HgbA1c at this time. Pt was not taking medications r/t not having insurance for several months. Pt reports weight loss, per weight hx, pt with 34 lb weight loss over 6 months (17.8% body weight, severe weight loss).  RD spoke with Pt and wife, states that he was eating well and appetite was normal. Weight loss started when he stopped taking his insulin.  RD expects pt to regain weight as he is receiving insulin again. Encouraged pt to follow carbohydrate controlled diet and continue with insulin as able for weight maintenance.  Pt denies needs for nutrition supplements or additional education needs.   NUTRITION DIAGNOSIS: -Impaired nutrient utilization (NI-2.1).  Status: Resolved  RELATED TO: pt not taking insulin   AS EVIDENCE BY: weight loss despite normal intake   MONITORING/EVALUATION(Goals): Goal: PO intake of meals to meet >/=90% estimated nutrition needs Monitor: PO intake, weight, labs, I/O's  EDUCATION NEEDS: -Education needs addressed---pt denied needs for education at this time.  INTERVENTION: No nutrition interventions at this time. RD will continue to follow for needs   DOCUMENTATION CODES Per approved criteria  -Not Applicable    Orson Slick RD, LDN Pager [phone] After Hours pager [phone]  07/31/2012, 11:47 AM

## 2012-07-31 NOTE — Progress Notes (Signed)
[place] for heparin Indication: chest pain/ACS  Allergies  Allergen Reactions  . Morphine     REACTION: hallucinations    Patient Measurements: Height: 5\' 9"  (175.3 cm) Weight: 157 lb 10.1 oz (71.5 kg) IBW/kg (Calculated) : 70.7  Heparin Dosing Weight: 72 kg   Vital Signs: Temp: 97.4 F (36.3 C) (08/26 0756) Temp src: Oral (08/26 0756) BP: 100/56 mmHg (08/26 0345) Pulse Rate: 63  (08/26 0345)  Labs:  [facility] 07/31/12 0608 07/31/12 0028 07/30/12 2035 07/30/12 2034 07/30/12 1911 07/30/12 1900  HGB -- -- -- -- 16.7 16.2  HCT -- -- -- -- 49.0 43.8  PLT -- -- -- -- -- 173  APTT -- -- -- -- -- --  LABPROT -- 13.2 -- -- -- --  INR -- 0.98 -- -- -- --  HEPARINUNFRC <0.10* -- -- -- -- --  CREATININE 0.99 -- -- 0.90 1.00 --  CKTOTAL 29 29 -- -- -- --  CKMB 1.4 1.4 -- -- -- --  TROPONINI <0.30 <0.30 <0.30 -- -- --    Estimated Creatinine Clearance: 76.4 ml/min (by C-G formula based on Cr of 0.99).   Medications:  Scheduled:     . sodium chloride   Intravenous Once  . aspirin  324 mg Oral Once  . aspirin EC  81 mg Oral Daily  . atorvastatin  40 mg Oral q1800  . heparin  4,000 Units Intravenous Once  . HYDROcodone-acetaminophen  2 tablet Oral Once  . insulin aspart  0-15 Units Subcutaneous TID WC  . insulin aspart  0-5 Units Subcutaneous QHS  . insulin aspart  8 Units Subcutaneous Once  . insulin glargine  20 Units Subcutaneous QHS  . metoprolol tartrate  12.5 mg Oral BID  . DISCONTD: aspirin  324 mg Oral NOW  . DISCONTD: aspirin  300 mg Rectal NOW  . DISCONTD: insulin aspart  5 Units Subcutaneous Once  . DISCONTD: lisinopril  5 mg Oral Daily  . DISCONTD: metoprolol tartrate  25 mg Oral BID    Assessment: 66 yo male with h/o CABG (2000) admitted with r/o ACS. Pharmacy consulted to manage heparin. Initial heparin level is < 0.1, patient noted for cath today  Goal of Therapy:  Heparin level 0.3-0.7 units/ml Monitor  platelets by anticoagulation protocol: Yes   Plan:  1. Heparin IV bolus of 2000 units x 1, then increase to 1050 units/hr.  2. Will follow post cath  Hildred Laser, Pharm D 07/31/2012 8:08 AM

## 2012-07-31 NOTE — Care Management Note (Addendum)
    Page 1 of 1   08/02/2012     2:56:16 PM   CARE MANAGEMENT NOTE 08/02/2012  Patient:  RichardRichard [name]   Account Number:  [id number]  Date Initiated:  07/31/2012  Documentation initiated by:  Elissa Hefty  Subjective/Objective Assessment:   adm w angina     Action/Plan:   lives w wife, pcp dr Shawna Orleans   Anticipated DC Date:     Anticipated DC Plan:        DC Planning Services  CM consult      Choice offered to / List presented to:             Status of service:  Completed, signed off Medicare Important Message given?   (If response is "NO", the following Medicare IM given date fields will be blank) Date Medicare IM given:   Date Additional Medicare IM given:    Discharge Disposition:  HOME/SELF CARE  Per UR Regulation:  Reviewed for med. necessity/level of care/duration of stay  If discussed at [place] of Stay Meetings, dates discussed:    Comments:  07-31-12 [place], [place] [phone] Cm spoke to pt and he has concerns with cost of medications. Pt states he and wife are visiting daughter and they live at [place]. Disability will not start until December. Pt states he uses [facility]. CM did discuss with pt about [facility]  and the $4.00 med list. CM did speak to Diabetes Coordinator Rubie Maid and she will talk to pt about [name] compatible insulin that is cheaper. MD please write Rx for cheaper statin at d/c. Pt will not be able to afford lipitor. All the other meds seemed to be affordable. Thanks   8/26 11:05a debbie dowell rn,bsn [id number] no ins at present. gave pt 2 preacription copay cards that may help w copay for nongeneric meds.

## 2012-08-01 ENCOUNTER — Encounter (HOSPITAL_COMMUNITY): Payer: Self-pay | Admitting: Cardiology

## 2012-08-01 ENCOUNTER — Encounter (HOSPITAL_COMMUNITY)
Admission: EM | Disposition: A | Discharge status: Home / Self Care | Payer: Self-pay | Source: Home / Self Care | Attending: Cardiology

## 2012-08-01 ENCOUNTER — Inpatient Hospital Stay (HOSPITAL_COMMUNITY): Payer: Medicaid Other

## 2012-08-01 DIAGNOSIS — I2510 Atherosclerotic heart disease of native coronary artery without angina pectoris: Secondary | ICD-10-CM

## 2012-08-01 HISTORY — PX: LEFT HEART CATHETERIZATION WITH CORONARY/GRAFT ANGIOGRAM: SHX5450

## 2012-08-01 LAB — CBC
Hemoglobin: 14.3 g/dL (ref 13.0–17.0)
MCH: 32.1 pg (ref 26.0–34.0)
MCV: 87 fL (ref 78.0–100.0)
RBC: 4.46 MIL/uL (ref 4.22–5.81)

## 2012-08-01 LAB — BASIC METABOLIC PANEL
CO2: 26 mEq/L (ref 19–32)
Calcium: 9.1 mg/dL (ref 8.4–10.5)
Chloride: 104 mEq/L (ref 96–112)
Creatinine, Ser: 0.89 mg/dL (ref 0.50–1.35)
Glucose, Bld: 249 mg/dL — ABNORMAL HIGH (ref 70–99)

## 2012-08-01 LAB — GLUCOSE, CAPILLARY
Glucose-Capillary: 226 mg/dL — ABNORMAL HIGH (ref 70–99)
Glucose-Capillary: 205 mg/dL — ABNORMAL HIGH (ref 70–99)

## 2012-08-01 LAB — D-DIMER, QUANTITATIVE: D-Dimer, Quant: <0.22 ug/mL-FEU (ref 0.00–0.48)

## 2012-08-01 SURGERY — LEFT HEART CATHETERIZATION WITH CORONARY/GRAFT ANGIOGRAM
Anesthesia: LOCAL

## 2012-08-01 MED ORDER — LIDOCAINE HCL (PF) 1 % IJ SOLN
INTRAMUSCULAR | Status: AC
  Filled 2012-08-01: qty 30

## 2012-08-01 MED ORDER — SODIUM CHLORIDE 0.9 % IV SOLN
INTRAVENOUS | Status: AC
  Administered 2012-08-01: 09:00:00 via INTRAVENOUS

## 2012-08-01 MED ORDER — MIDAZOLAM HCL 2 MG/2ML IJ SOLN
INTRAMUSCULAR | Status: AC
  Filled 2012-08-01: qty 2

## 2012-08-01 MED ORDER — HEPARIN (PORCINE) IN NACL 2-0.9 UNIT/ML-% IJ SOLN
INTRAMUSCULAR | Status: AC
  Filled 2012-08-01: qty 2000

## 2012-08-01 MED ORDER — INSULIN GLARGINE 100 UNIT/ML ~~LOC~~ SOLN
40.0000 [IU] | Freq: Every day | SUBCUTANEOUS | Status: DC
  Administered 2012-08-01 – 2012-08-02 (×2): 40 [IU] via SUBCUTANEOUS

## 2012-08-01 MED ORDER — NITROGLYCERIN 0.2 MG/ML ON CALL CATH LAB
INTRAVENOUS | Status: AC
  Filled 2012-08-01: qty 1

## 2012-08-01 NOTE — H&P (View-Only) (Signed)
Cardiology Progress Note Patient Name: [name] [name] Date of Encounter: 07/31/2012, 8:16 AM     Subjective  Patient had episode of chest pain early this morning requiring NTG. He then developed hypotension resolved with IVF. Denies chest pain or sob this morning. Patient reports he has not been taking any of his medications except insulin, ASA, bystoilc and PRN lasix, for the last 3-54mos due to loss of insurance.   Objective   Telemetry: Sinus rhythm 40-80s  Medications: . sodium chloride   Intravenous Once  . aspirin  324 mg Oral Once  . aspirin EC  81 mg Oral Daily  . atorvastatin  40 mg Oral q1800  . heparin  2,000 Units Intravenous Once  . heparin  4,000 Units Intravenous Once  . HYDROcodone-acetaminophen  2 tablet Oral Once  . insulin aspart  0-15 Units Subcutaneous TID WC  . insulin aspart  0-5 Units Subcutaneous QHS  . insulin aspart  8 Units Subcutaneous Once  . insulin glargine  20 Units Subcutaneous QHS  . metoprolol tartrate  12.5 mg Oral BID  . DISCONTD: lisinopril  5 mg Oral Daily  . DISCONTD: metoprolol tartrate  25 mg Oral BID   . sodium chloride 10 mL/hr at 07/31/12 0130  . heparin 1,050 Units/hr (07/31/12 0815)    Physical Exam: Temp:  [97.4 F (36.3 C)-98.2 F (36.8 C)] 97.4 F (36.3 C) (08/26 0756) Pulse Rate:  [51-84] 63  (08/26 0345) Resp:  [9-18] 18  (08/25 2345) BP: (74-148)/(48-84) 100/56 mmHg (08/26 0345) SpO2:  [94 %-99 %] 95 % (08/26 0345) Weight:  [157 lb 10.1 oz (71.5 kg)] 157 lb 10.1 oz (71.5 kg) (08/26 0000)  General: Well developed white male, in no acute distress. Head: Normocephalic, atraumatic, sclera non-icteric, nares are without discharge.  Neck: Supple. Negative for carotid bruits or JVD Lungs: Clear bilaterally to auscultation without wheezes, rales, or rhonchi. Breathing is unlabored. Heart: RRR S1 S2 without murmurs, rubs, or gallops.  Abdomen: Soft, non-tender, non-distended with normoactive bowel sounds. No  rebound/guarding. No obvious abdominal masses. Msk:  Strength and tone appear normal for age. Extremities: No edema. No clubbing or cyanosis. Distal pedal pulses are intact and equal bilaterally. Neuro: Alert and oriented X 3. Moves all extremities spontaneously. Psych:  Responds to questions appropriately with a normal affect.   Intake/Output Summary (Last 24 hours) at 07/31/12 0816 Last data filed at 07/31/12 0305  Gross per 24 hour  Intake 281.59 ml  Output      0 ml  Net 281.59 ml    Labs:  [facility] 07/31/12 0608 07/31/12 0028 07/30/12 2034  NA 139 -- 133*  K 4.7 -- 4.2  CL 103 -- 96  CO2 29 -- 23  GLUCOSE 192* -- 432*  BUN 17 -- 16  CREATININE 0.99 -- 0.90  CALCIUM 10.0 -- 9.7  MG -- 2.1 --   Basename 07/30/12 1911 07/30/12 1900  WBC -- 7.5  HGB 16.7 16.2  HCT 49.0 43.8  MCV -- 87.8  PLT -- 173   Basename 07/31/12 0608 07/31/12 0028 07/30/12 2035  CKTOTAL 29 29 --  CKMB 1.4 1.4 --  TROPONINI <0.30 <0.30 <0.30   Basename 07/31/12 0608  CHOL 218*  HDL 32*  LDLCALC 133*  TRIG 264*  CHOLHDL 6.8   Radiology/Studies:   8/25/201 - Chest 2 View Findings: The cardiac silhouette, mediastinal and hilar contours are within normal limits and stable. Stable surgical changes from triple bypass surgery.  The lungs are clear of acute process. Minimal left basilar scarring changes.  No pleural effusion.  The bony thorax is intact.  IMPRESSION: No acute cardiopulmonary findings.        Assessment and Plan   1. Unstable Angina: Patient has history of CAD with CABG and PCI. He has been off of his medications including Plavix, Imdur, and Zocor for 3-40mos due to loss of insurance. He presents with progressive chest pain concerning for unstable angina. Cardiac enzymes normal and EKG without acute ischemic changes. Given his symptoms and cardiac history he will need further ischemic evaluation with cardiac cath, however, given his significant hyperglycemia will hydrate and  normalize his blood glucose and plan for cath tomorrow morning unless he becomes unstable. Obtain echo today to assess LV function. Hydrate with IVF @ 71ml/hr. Cont IV heparin, ASA, BB, statin, PRN NTG. Resume home Imdur once off NTG drip.  2. Coronary Artery Disease: H/o CABG 2000, DES to RCA '09. Cath 01/2010 with patent grafts/stent and modest progression of distal disease. Plan as above.   3. Diabetes Mellitus, Type 2: Uncontrolled. Blood glucose >500 on admission. Was getting samples of his insulin, but was not taking metformin. Will hold metformin for cath. Restarted on Lantus and placed on SSI. A1C pending.   4. Hypertension: Was taking Bystolic 10mg  daily at home. Started on Lisinopril and Metoprolol on admission. Episode of hypotension last night with NTG. Lisinopril stopped and Metoprolol decreased. BP stable.  5. Hyperlipidemia: No statin for the last 3-45mos. LDL 133. Restarted on statin and Fish oil. Check LFTs. Will need f/u LFTs/Lipid panel in 6-8wks.  6. Hypothyroidism: No Synthroid for the last 3-65mos. Was on 38mcg daily. Discussed with pharmacist and will resume home dose and check TSH.  7. Depression: No Zoloft for the last 3-60mos. Will resume.  8. GERD: Restart Protonix  9. Medication Noncompliance: Lost medical insurance about 3-53mos ago. Has been unable to afford most of his medications. Will have Medicare in December. Consult for case management to assist with medications at discharge until Medicare coverage begins.    Signed, HOPE, JESSICA PA-C  Patient seen and examined.  He has not been taking a lot of his medications due to cost.  Plan cath study tomorrow.  Patient agreeable.

## 2012-08-01 NOTE — Interval H&P Note (Signed)
History and Physical Interval Note:  08/01/2012 7:45 AM  [name] [name]  has presented today for surgery, with the diagnosis of c/p  The various methods of treatment have been discussed with the patient and family. After consideration of risks, benefits and other options for treatment, the patient has consented to  Procedure(s) (LRB): LEFT HEART CATHETERIZATION WITH CORONARY/GRAFT ANGIOGRAM (N/A) as a surgical intervention .  The patient's history has been reviewed, patient examined, no change in status, stable for surgery.  I have reviewed the patient's chart and labs.  Questions were answered to the patient's satisfaction.     Bing Quarry

## 2012-08-01 NOTE — CV Procedure (Signed)
   Cardiac Catheterization Procedure Note  Name: [name] [name] MRN: [id number] DOB: 1946/07/03  Procedure: Left Heart Cath, Selective Coronary Angiography, SVG and LIMA angiography,  LV angiography  Indication: chest pain, meds stopped.  Recurrent chest pain.       Procedural details: The right groin was prepped, draped, and anesthetized with 1% lidocaine. Using modified Seldinger technique, a 4 French sheath was introduced into the right femoral artery. Standard Judkins catheters were used for coronary angiography and left ventriculography. Catheter exchanges were performed over a guidewire. There were no immediate procedural complications. The patient was transferred to the post catheterization recovery area for further monitoring.  Procedural Findings: Hemodynamics:  AO 133/63 (86) LV 132/8   Coronary angiography: Coronary dominance: right  Left mainstem: Normal, no significant obstruction  Left anterior descending (LAD): 80-90% segmental plaque after tiny diagonal with evidence of competitive filling of both a diagonal and distal LAD.    Left circumflex (LCx): Provides a tiny ramus, small OM2, and two large PLA branches.  The distal branch appears improved from the prior study.  No significant obstruction.   Right coronary artery (RCA): The RCA provides a large AM or PDA branch and has a widely patent stent in the mid vessel without obstruction.  The continuation branch is occluded after the takeoff of the AM/PDA  The SVG to small intermediate is widely patent.  Distal runoff is good.  Distal vessel caliber is small  The SVG to D1 is patent with good distal runoff.  Distal vessel caliber is small.  The SVG to PLA system is widely patent.  Distal vessel caliber is small.  The LIMA to the LAD is widely patent.    Left ventriculography: Not done due to echo and goal to reduce contrast in diabetic patient.     Final Conclusions:   1.  Preserved LV function by  echocardiography. 2.  Patent IMA to the LAD and patent SVGs to all three grafts. 3.  Patent native CFX   Recommendations:  1.  Continue medical therapy.    Bing Quarry MD [facility], [place] 08/01/2012, 8:44 AM

## 2012-08-01 NOTE — Progress Notes (Addendum)
Inpatient Diabetes Program Recommendations  AACE/ADA: New Consensus Statement on Inpatient Glycemic Control (2013)  Target Ranges:  Prepandial:   less than 140 mg/dL      Peak postprandial:   less than 180 mg/dL (1-2 hours)      Critically ill patients:  140 - 180 mg/dL   Reason for Visit: Results for JAKEITH, DEMONT (MRN [id number]) as of 08/01/2012 14:26  Ref. Range 07/31/2012 16:46 07/31/2012 21:27 08/01/2012 06:30 08/01/2012 08:35 08/01/2012 11:41  Glucose-Capillary Latest Range: 70-99 mg/dL 253 (H) 230 (H) 226 (H) 234 (H) 205 (H)   Note A1C=11.9%.  Note home dose of Lantus is 40 units q HS.  CBG's greater than 200 mg/dL.  Please consider increasing Lantus to home dose of 40 units at HS.      Note: Will follow.  Addendum:  1600 received call that patient is unable to afford Lantus.  He is Medicaid pending.  He could use generic insulins such as NPH or 70/30, however it is not clear whether patient will be able to follow-up with PCP for adjustments in insulin dosages.  Called and discussed with PA-Rhonda.  She will write Rx's for [employer] fund for 1 vial of lantus.  This will last approximately 24 days.  Gave wife and patient other resources regarding patient assistance through [employer].  If he qualifies for Medicaid these meds will be covered.

## 2012-08-01 NOTE — Progress Notes (Signed)
[place] for heparin Indication: chest pain/ACS  Allergies  Allergen Reactions  . Morphine     REACTION: hallucinations    Patient Measurements: Height: 5\' 9"  (175.3 cm) Weight: 166 lb 0.1 oz (75.3 kg) IBW/kg (Calculated) : 70.7  Heparin Dosing Weight: 72 kg   Vital Signs: Temp: 98 F (36.7 C) (08/27 0250) Temp src: Oral (08/27 0250) BP: 95/51 mmHg (08/27 0250) Pulse Rate: 51  (08/27 0250)  Labs:  Basename 08/01/12 0500 07/31/12 1123 07/31/12 0608 07/31/12 0028 07/30/12 2034 07/30/12 1911 07/30/12 1900  HGB 14.3 -- -- -- -- 16.7 --  HCT 38.8* -- -- -- -- 49.0 43.8  PLT 145* -- -- -- -- -- 173  APTT -- -- -- -- -- -- --  LABPROT -- -- -- 13.2 -- -- --  INR -- -- -- 0.98 -- -- --  HEPARINUNFRC <0.10* -- <0.10* -- -- -- --  CREATININE 0.89 -- 0.99 -- 0.90 -- --  CKTOTAL -- 31 29 29  -- -- --  CKMB -- 1.3 1.4 1.4 -- -- --  TROPONINI -- <0.30 <0.30 <0.30 -- -- --    Estimated Creatinine Clearance: 85 ml/min (by C-G formula based on Cr of 0.89).   Medications:  Scheduled:     . aspirin  324 mg Oral Pre-Cath  . aspirin EC  81 mg Oral Daily  . atorvastatin  40 mg Oral q1800  . diazepam  5 mg Oral On Call  . gabapentin  300 mg Oral TID  . heparin  2,000 Units Intravenous Once  . insulin aspart  0-15 Units Subcutaneous TID WC  . insulin aspart  0-5 Units Subcutaneous QHS  . insulin glargine  20 Units Subcutaneous QHS  . levothyroxine  75 mcg Oral QAC breakfast  . metoprolol tartrate  12.5 mg Oral BID  . omega-3 acid ethyl esters  2 g Oral BID  . pantoprazole  40 mg Oral Q1200  . pramipexole  0.25 mg Oral QHS  . sertraline  100 mg Oral BID  . DISCONTD: fish oil-omega-3 fatty acids  2 g Oral BID    Assessment: 66 yo male with h/o CABG (2000) admitted with r/o ACS. Pharmacy consulted to manage heparin. Heparin level (<0.1) is below-goal on 1050 units/hr. No problem with line / infusion per RN. Patient for cath today, currently  scheduled for 07:30.  Goal of Therapy:  Heparin level 0.3-0.7 units/ml Monitor platelets by anticoagulation protocol: Yes   Plan:  1. Increase IV heparin to 1300 units/hr.  2. Heparin level in 6 hr vs post-cath.    Raye Sorrow, PharmD 08/01/2012 6:14 AM

## 2012-08-02 ENCOUNTER — Telehealth: Payer: Self-pay | Admitting: Internal Medicine

## 2012-08-02 DIAGNOSIS — I200 Unstable angina: Secondary | ICD-10-CM

## 2012-08-02 LAB — GLUCOSE, CAPILLARY
Glucose-Capillary: 107 mg/dL — ABNORMAL HIGH (ref 70–99)
Glucose-Capillary: 314 mg/dL — ABNORMAL HIGH (ref 70–99)
Glucose-Capillary: 146 mg/dL — ABNORMAL HIGH (ref 70–99)

## 2012-08-02 NOTE — Telephone Encounter (Signed)
No lantus available.  Pt can have some levemir per Dr Shawna Orleans

## 2012-08-02 NOTE — Telephone Encounter (Signed)
Pt spouse called and is req to get samples of insulin glargine (LANTUS SOLOSTAR) 100 UNIT/ML injection pens or vials. Or possibly some Levimer.  Pls call.

## 2012-08-02 NOTE — Progress Notes (Signed)
Subjective:  Patient stable at present.  Underwent cath yesterday.  Findings are noted below:  Final Conclusions:  1. Preserved LV function by echocardiography.  2. Patent IMA to the LAD and patent SVGs to all three grafts.  3. Patent native CFX  Recommendations:  1. Continue medical therapy.  Bing Quarry MD [facility], [facility]  08/01/2012, 8:44 AM   He has not been taking meds recently because of funding issues.  A1C is obviously elevated.  D-dimer is negative. He still feels a mild degree of burning.  He has not been taking his meds, at times not getting insulin due to cost.  He is on disability at present, but only gets about $1800 per month.  He says when he is on his meds he is fine.  Shoulder bothers him.  Xray is negative.      Objective:  Vital Signs in the last 24 hours: Temp:  [97.7 F (36.5 C)-98.2 F (36.8 C)] 97.7 F (36.5 C) (08/28 0500) Pulse Rate:  [53-64] 53  (08/28 0952) Resp:  [20] 20  (08/27 1356) BP: (112-131)/(55-73) 131/73 mmHg (08/28 0952) SpO2:  [98 %-99 %] 99 % (08/28 0500)  Intake/Output from previous day:     Physical Exam: General: Well developed, well nourished, in no acute distress. Head:  Normocephalic and atraumatic. Lungs: Clear to auscultation and percussion.  MS intact Heart: Normal S1 and S2.  No murmur, rubs or gallops.  Pulses: Pulses normal in all 4 extremities.  Cath site looks good.   Extremities: No clubbing or cyanosis. No edema. Neurologic: Alert and oriented x 3.    Lab Results:  Basename 08/01/12 0500 07/30/12 1911 07/30/12 1900  WBC 6.1 -- 7.5  HGB 14.3 16.7 --  PLT 145* -- 173    Basename 08/01/12 0500 07/31/12 0608  NA 139 139  K 4.7 4.7  CL 104 103  CO2 26 29  GLUCOSE 249* 192*  BUN 13 17  CREATININE 0.89 0.99    Basename 07/31/12 1123 07/31/12 0608  TROPONINI <0.30 <0.30   Hepatic Function Panel  Basename 07/31/12 1123  PROT 6.8  ALBUMIN 3.9  AST 15  ALT 14  ALKPHOS 85  BILITOT 0.5  BILIDIR <0.1    IBILI NOT CALCULATED    Basename 07/31/12 0608  CHOL 218*   No results found for this basename: PROTIME in the last 72 hours  Imaging: Dg Shoulder Right  08/01/2012  *RADIOLOGY REPORT*  Clinical Data: Fall, right shoulder pain  RIGHT SHOULDER - 2+ VIEW  Comparison: None.  Findings: No fracture or dislocation is seen.  The joint spaces are preserved.  Visualized right lung is clear.  IMPRESSION: No fracture or dislocation is seen.   Original Report Authenticated By: Julian Hy, [name]       Assessment/Plan:  1.  Chest pain  -- all grafts are patent as noted, and findings have not changed substantially.  I think he can go home, and he wants to.  I would not resume clopidogrel as he has not been taking it for nearly six months, and with that, and time frame, he is probably stable.  He has an EES DES (second generation) in his RCA, and is well beyond the window.  Therefore will stop.  Isosorbide helps him but would reduce dose to 60mg  per day.  2.  DM  --very poor control at this point.  A1c is high.  He knows what he needs at home so would resume home meds.  3.  Shoulder  pain  -- he may need an MRI but this can be done as OP.  He gets Medicare in December. 4.  HTN  -  BP is well controlled.  Pulse is slow but stable.  On Metoprolol, but bystolic 10 might still be the best OP med.   Other meds should be prescribed, but no ace or furosemide for now.         Bing Quarry, MD, [facility], [place] 08/02/2012, 10:49 AM

## 2012-08-03 ENCOUNTER — Encounter (HOSPITAL_COMMUNITY): Payer: Self-pay | Admitting: Physician Assistant

## 2012-08-03 DIAGNOSIS — R079 Chest pain, unspecified: Secondary | ICD-10-CM

## 2012-08-03 MED ORDER — ISOSORBIDE MONONITRATE ER 60 MG PO TB24: 60.0000 mg | ORAL_TABLET | Freq: Every day | ORAL | Status: DC

## 2012-08-03 MED ORDER — NEBIVOLOL HCL 10 MG PO TABS: 10.0000 mg | ORAL_TABLET | Freq: Every day | ORAL | Status: DC

## 2012-08-03 MED ORDER — METFORMIN HCL 1000 MG PO TABS: 1000.0000 mg | ORAL_TABLET | Freq: Two times a day (BID) | ORAL | Status: DC

## 2012-08-03 MED ORDER — NITROGLYCERIN 0.4 MG SL SUBL: 0.4000 mg | SUBLINGUAL_TABLET | SUBLINGUAL | Status: DC | PRN

## 2012-08-03 MED ORDER — SIMVASTATIN 40 MG PO TABS: 40.0000 mg | ORAL_TABLET | Freq: Every evening | ORAL | Status: DC

## 2012-08-03 NOTE — Progress Notes (Signed)
Subjective:  Patient stable and ready for dc.  Issue of insulin now resolved.  Will get vial from hospital and then get a one from office in [place].  Minor discomfort, but stable.   Objective:  Vital Signs in the last 24 hours: Temp:  [97.4 F (36.3 C)-97.8 F (36.6 C)] 97.6 F (36.4 C) (08/29 0453) Pulse Rate:  [52-59] 52  (08/29 0453) Resp:  [16-18] 16  (08/29 0453) BP: (114-134)/(72-75) 114/74 mmHg (08/29 0453) SpO2:  [95 %-100 %] 95 % (08/29 0453)  Intake/Output from previous day: 08/28 0701 - 08/29 0700 In: 1200 [P.O.:1200] Out: -    Physical Exam: General: Well developed, well nourished, in no acute distress. Head:  Normocephalic and atraumatic. Lungs: Clear to auscultation and percussion. Heart: Normal S1 and S2.  No murmur, rubs or gallops.  Neurologic: Alert and oriented x 3.    Lab Results:  Basename 08/01/12 0500  WBC 6.1  HGB 14.3  PLT 145*    Basename 08/01/12 0500  NA 139  K 4.7  CL 104  CO2 26  GLUCOSE 249*  BUN 13  CREATININE 0.89   No results found for this basename: TROPONINI:2,CK,MB:2 in the last 72 hours Hepatic Function Panel No results found for this basename: PROT,ALBUMIN,AST,ALT,ALKPHOS,BILITOT,BILIDIR,IBILI in the last 72 hours No results found for this basename: CHOL in the last 72 hours No results found for this basename: PROTIME in the last 72 hours  Imaging: Dg Shoulder Right  08/01/2012  *RADIOLOGY REPORT*  Clinical Data: Fall, right shoulder pain  RIGHT SHOULDER - 2+ VIEW  Comparison: None.  Findings: No fracture or dislocation is seen.  The joint spaces are preserved.  Visualized right lung is clear.  IMPRESSION: No fracture or dislocation is seen.   Original Report Authenticated By: Julian Hy, [name]       Assessment/Plan:  1.  Chest pain   -  Resolved.  See cath report.  Ready for discharge 2.  DM  -   Poorly controlled and not med compliant. 3.  Shoulder pain  -  Neg xray prob needs MRI.    Patient ready for  dc.        Bing Quarry, MD, [facility], [place] 08/03/2012, 12:03 PM

## 2012-08-03 NOTE — Progress Notes (Signed)
Nutrition Follow-up  Intervention:    No nutrition intervention at this time RD to continue to follow  Assessment:   Patient s/p cardiac cath 8/27. Reports a good appetite. PO intake 100% per flowsheet records.  Diet Order:  Carbohydrate Modified Medium Calorie  Meds: Scheduled Meds:   . aspirin EC  81 mg Oral Daily  . atorvastatin  40 mg Oral q1800  . gabapentin  300 mg Oral TID  . insulin aspart  0-15 Units Subcutaneous TID WC  . insulin aspart  0-5 Units Subcutaneous QHS  . insulin glargine  40 Units Subcutaneous QHS  . levothyroxine  75 mcg Oral QAC breakfast  . metoprolol tartrate  12.5 mg Oral BID  . omega-3 acid ethyl esters  2 g Oral BID  . pantoprazole  40 mg Oral Q1200  . pramipexole  0.25 mg Oral QHS  . sertraline  100 mg Oral BID   Continuous Infusions:  PRN Meds:.acetaminophen, nitroGLYCERIN, nitroGLYCERIN, ondansetron (ZOFRAN) IV, traMADol  Labs:  CMP     Component Value Date/Time   NA 139 08/01/2012 0500   K 4.7 08/01/2012 0500   CL 104 08/01/2012 0500   CO2 26 08/01/2012 0500   GLUCOSE 249* 08/01/2012 0500   BUN 13 08/01/2012 0500   CREATININE 0.89 08/01/2012 0500   CALCIUM 9.1 08/01/2012 0500   PROT 6.8 07/31/2012 1123   ALBUMIN 3.9 07/31/2012 1123   AST 15 07/31/2012 1123   ALT 14 07/31/2012 1123   ALKPHOS 85 07/31/2012 1123   BILITOT 0.5 07/31/2012 1123   GFRNONAA 89* 08/01/2012 0500   GFRAA >90 08/01/2012 0500     Intake/Output Summary (Last 24 hours) at 08/03/12 1139 Last data filed at 08/03/12 0800  Gross per 24 hour  Intake   1240 ml  Output      0 ml  Net   1240 ml    CBG (last 3)   Basename 08/03/12 0729 08/02/12 2023 08/02/12 1624  GLUCAP 136* 146* 314*    Weight Status:  75.3 kg (8/26) -- no current weight available  Estimated needs:  1800-2000 kcals, 75-85 gm protein  Nutrition Dx: Impaired nutrient utilization, resolved  Goal:  Oral intake with meals & supplements to meet >/= 90% of estimated nutrition needs, met  Monitor:  PO  intake, weight, labs, I/O's  Phillips Odor, RD, LDN Pager #: [phone] After-Hours Pager #: [phone]

## 2012-08-03 NOTE — Discharge Summary (Signed)
Discharge Summary   Patient ID: [name] [name] MRN: [id number], DOB/AGE: 07-28-1946 66 y.o. Admit date: 07/30/2012 D/C date:     08/03/2012  Primary Cardiologist: [name]  Primary Discharge Diagnoses:  1. Chest pain with stable coronary anatomy by cath 2. CAD - cath as above - hx: CABG 2000, subsequent RCA PCI 3. Diabetes mellitus, uncontrolled 4. Shoulder pain - pt to f/u with PCP  5. Medication noncompliance 6. HTN 7. HL  Secondary Discharge Diagnoses:  1. OSA 2. RBBB 3. Depression bipolar 4. Falling episodes occuring in the past 5. Spondylosis 6. Palpitations - prior event recorder showing NSR 7. Nephrolithiasis 8. Restless leg 10. SOB - CPX April, 2011, mild functional limitation, no clear pulmonary or cardiac limitation, possible deconditioning and mild chronotropic incompetence( peak heart rate 130)  11. Cerebral ischemia - MRI November, 2010, chronic microvascular ischemia  12. Anemia - hemoglobin 7.4, iron deficiency, January, 2011, 2 unit transfusion, endoscopy normal, capsule endoscopy February, 2011 no small bowel abnormalities. Most likely source gastric erosions, followed by GI  Hospital Course: [name] [name] is a 66 year old gentleman with known coronary artery disease requiring surgical bypass grafting (2000) and subsequent RCA PCI, diabetes, obstructive sleep apnea, hypertension who presented with chest pain. For the past few months, he has been experiencing regular episodes of left-sided chest pain which generally occurred with exertion. It occasionally radiated to his left shoulder and to his jaw, associated with mild nausea, diaphoresis and lightheadedness. In fact he had a fall while in the shower a few weeks ago in the setting of chest pain, resulting in a right shoulder injury which still continues to bother him. His episodic chest pain symptoms usually improve with rest within a few minutes. NTG has given some relief. He experienced a more pronounced episode on  day of admission associated with vomiting and diaphoresis so came to the ER. He also admitted to not taking his prescribed medications due to insurance/financial barriers, though he has continued to take a daily aspirin. In the ER, EKG showed sinus rhythm, RBBB, left axis deviation. Initial troponin was negative. CBG was 495. He was started on IV heparin. Metoprolol, IV NTG gtt were initiated. Insulin was re-initiated (initial glucose was nearly 500). 2D Echo was obtained 07/31/12 demonstrating normal EF, no RWMA. Cardiac cath was performed for concern for unstable angina demonstrating patent IMA to the LAD and patent SVGs to all three grafts, patent native Cx. Cardiac enzymes remained negative. Med rx was recommended. D-dimer was negative. Dr. Lia Foyer has seen and examined the patient and feels he is stable for discharge. Regarding Dr. Maren Beach medication recommendations: He does not want to resume Plavix as the patient has not been taking it for 6 months and is probably stable. Imdur will be reduced to 60mg  daily. The patient will go home on Lantus. He has had shoulder pain but plans to f/u with PCP for possible MRI once his Medicare starts in December (negative plain film this admission). He will resume bystolic 10mg  daily. He will not go home on ACEI or furosemide for now. He will receive a zzfund rx for Lantus for 1 vial with assistance from [facility], and the patient has also contacted his primary care doctor already regarding samples. His Metformin will be restarted tomorrow (given that he was still requiring SSI while in the hospital on top of his Lantus). His home sliding scale insulin will remain on hold until he can discuss a plan for monitoring his diabetes and we will  defer to PCP regarding re-initiation.  The patient was also taking levothyroxine as an outpatient and this was resumed in the hospital. He will need to continue to f/u with PCP regarding thyroid function given inconsistent and  questionable compliance.   Discharge Vitals: Blood pressure 114/74, pulse 52, temperature 97.6 F (36.4 C), temperature source Oral, resp. rate 16, height 5\' 9"  (1.753 m), weight 166 lb 0.1 oz (75.3 kg), SpO2 95.00%.  Labs: Lab Results  Component Value Date   WBC 6.1 08/01/2012   HGB 14.3 08/01/2012   HCT 38.8* 08/01/2012   MCV 87.0 08/01/2012   PLT 145* 08/01/2012     Lab 08/01/12 0500 07/31/12 1123  NA 139 --  K 4.7 --  CL 104 --  CO2 26 --  BUN 13 --  CREATININE 0.89 --  CALCIUM 9.1 --  PROT -- 6.8  BILITOT -- 0.5  ALKPHOS -- 85  ALT -- 14  AST -- 15  GLUCOSE 249* --    Lab Results  Component Value Date   CHOL 218* 07/31/2012   HDL 32* 07/31/2012   LDLCALC 133* 07/31/2012   TRIG 264* 07/31/2012   Lab Results  Component Value Date   DDIMER <0.22 08/01/2012    Diagnostic Studies/Procedures   1. 2D Echo 07/31/12 study Conclusions Left ventricle: The cavity size was normal. Wall thickness was normal. Systolic function was normal. The estimated ejection fraction was in the range of 60% to 65%. Wall motion was normal; there were no regional wall motion abnormalities. Doppler parameters are consistent with abnormal left ventricular relaxation (grade 1 diastolic dysfunction).  2. Cardiac catheterization this admission, please see full report and above for summary.  3. Dg Chest 2 View 07/30/2012  *RADIOLOGY REPORT*  Clinical Data: Chest pain and shortness of breath.  CHEST - 2 VIEW  Comparison: 10/19/2011.  Findings: The cardiac silhouette, mediastinal and hilar contours are within normal limits and stable. Stable surgical changes from triple bypass surgery.  The lungs are clear of acute process. Minimal left basilar scarring changes.  No pleural effusion.  The bony thorax is intact.  IMPRESSION: No acute cardiopulmonary findings.   Original Report Authenticated By: P. Kalman Jewels, [name]    4. Dg Shoulder Right8/27/2013  *RADIOLOGY REPORT*  Clinical Data: Fall, right shoulder pain   RIGHT SHOULDER - 2+ VIEW  Comparison: None.  Findings: No fracture or dislocation is seen.  The joint spaces are preserved.  Visualized right lung is clear.  IMPRESSION: No fracture or dislocation is seen.   Original Report Authenticated By: Julian Hy, [name]    Discharge Medications   Current Discharge Medication List    CONTINUE these medications which have CHANGED   Details  isosorbide mononitrate (IMDUR) 60 MG 24 hr tablet Take 1 tablet (60 mg total) by mouth daily. Qty: 30 tablet, Refills: 6    metFORMIN (GLUCOPHAGE) 1000 MG tablet Take 1 tablet (1,000 mg total) by mouth 2 (two) times daily with a meal. Qty: 60 tablet, Refills: 1   Comments: Follow up with your primary doctor for this medicine. Do not start until tomorrow 08/04/12.    nebivolol (BYSTOLIC) 10 MG tablet Take 1 tablet (10 mg total) by mouth daily. Qty: 30 tablet, Refills: 6    nitroGLYCERIN (NITROSTAT) 0.4 MG SL tablet Place 1 tablet (0.4 mg total) under the tongue every 5 (five) minutes as needed (up to 3 doses). Qty: 25 tablet, Refills: 4    simvastatin (ZOCOR) 40 MG tablet Take 1 tablet (40 mg  total) by mouth every evening. Qty: 30 tablet, Refills: 6      CONTINUE these medications which have NOT CHANGED   Details  aspirin EC 81 MG tablet Take 81 mg by mouth at bedtime.     diclofenac sodium (VOLTAREN) 1 % GEL Apply 1 application topically 2 (two) times daily as needed. For pain to affected area     fish oil-omega-3 fatty acids 1000 MG capsule Take 2 g by mouth 2 (two) times daily.    gabapentin (NEURONTIN) 300 MG capsule Take 300 mg by mouth 3 (three) times daily.    insulin glargine (LANTUS SOLOSTAR) 100 UNIT/ML injection Inject 40 Units into the skin at bedtime.    levothyroxine (SYNTHROID, LEVOTHROID) 75 MCG tablet Take 75 mcg by mouth daily.    ondansetron (ZOFRAN) 8 MG tablet Take 8 mg by mouth every 12 (twelve) hours as needed. For nausea    pantoprazole (PROTONIX) 40 MG tablet Take 40 mg by  mouth daily.    pramipexole (MIRAPEX) 0.25 MG tablet Take 0.25 mg by mouth at bedtime.    sertraline (ZOLOFT) 100 MG tablet Take 100 mg by mouth 2 (two) times daily.      STOP taking these medications     clopidogrel (PLAVIX) 75 MG tablet Comments:  Reason for Stopping:       furosemide (LASIX) 20 MG tablet Comments:  Reason for Stopping:       insulin lispro (HUMALOG) 100 UNIT/ML injection Comments:  Reason for Stopping:            Disposition   The patient will be discharged in stable condition to home. Discharge Orders    Future Appointments: Provider: Department: Dept Phone: Center:   09/01/2012 3:30 PM Carlena Bjornstad, [place] [phone] [id number]     Future Orders Please Complete By Expires   Diet - low sodium heart healthy      Comments:   Diabetic Diet   Increase activity slowly      Comments:   No driving for 1 day. No lifting over 5 lbs for 1 week. No sexual activity for 1 week. Keep procedure site clean & dry. If you notice increased pain, swelling, bleeding or pus, call/return!  You may shower, but no soaking baths/hot tubs/pools for 1 week.   Discharge instructions      Comments:   Follow up with your primary care doctor for the following issues: 1. Your Lantus was restarted. Your sliding scale insulin will remain on hold until you can talk to your primary doctor about a plan to monitor your diabetes. Please check your sugar at least once a day and keep a log. Call your primary care doctor if you are getting low readings. 2. Your shoulder x-ray was negative but you may need further evaluation. 3. Your thyroid medicine was restarted and may require periodic monitoring soon after discharge.     Follow-up Information    Follow up with Drema Pry, DO. (Call your primary doctor at discharge to set up an appointment to discuss the following: 1) managing your diabetes medications, 2) evaluation of your shoulder pain, 3) to determine what  monitoring you will need for your thyroid function.)    Contact information:   [place] [phone]       Follow up with Dola Argyle, MD. (09/01/12 at 3:30pm)    Contact information:   [address] [place] [phone]  Duration of Discharge Encounter: Greater than 30 minutes including physician and PA time.  Signed, Melina Copa PA-C 08/03/2012, 12:45 PM

## 2012-08-24 NOTE — Discharge Summary (Signed)
See my progress note from today.  Agree with plans as outlined.  He was kept because the issue of his insulin was not yet worked out from the outpatient side, and now has been resolved on the short term.  Long discussion with patient.

## 2012-09-01 ENCOUNTER — Encounter: Payer: Self-pay | Admitting: Cardiology

## 2012-09-19 ENCOUNTER — Encounter: Payer: Self-pay | Admitting: Cardiology

## 2012-09-19 DIAGNOSIS — IMO0002 Reserved for concepts with insufficient information to code with codable children: Secondary | ICD-10-CM | POA: Insufficient documentation

## 2012-09-19 DIAGNOSIS — R9430 Abnormal result of cardiovascular function study, unspecified: Secondary | ICD-10-CM | POA: Insufficient documentation

## 2012-09-20 ENCOUNTER — Telehealth: Payer: Self-pay | Admitting: Internal Medicine

## 2012-09-20 ENCOUNTER — Ambulatory Visit (INDEPENDENT_AMBULATORY_CARE_PROVIDER_SITE_OTHER): Payer: Medicaid Other | Admitting: Cardiology

## 2012-09-20 ENCOUNTER — Encounter: Payer: Self-pay | Admitting: Cardiology

## 2012-09-20 VITALS — BP 114/60 | HR 70 | Ht 69.0 in | Wt 182.8 lb

## 2012-09-20 DIAGNOSIS — R002 Palpitations: Secondary | ICD-10-CM

## 2012-09-20 DIAGNOSIS — I2510 Atherosclerotic heart disease of native coronary artery without angina pectoris: Secondary | ICD-10-CM

## 2012-09-20 MED ORDER — ISOSORBIDE MONONITRATE ER 120 MG PO TB24: 120.0000 mg | ORAL_TABLET | Freq: Every day | ORAL | Status: DC

## 2012-09-20 NOTE — Assessment & Plan Note (Signed)
He is not having any marked palpitations. No further workup.  As part of today's evaluation I have reviewed extensively the records from the hospital. I have updated the problem list in the medical record.

## 2012-09-20 NOTE — Assessment & Plan Note (Signed)
Coronary disease is stable. No change in therapy. It is important that he gets Imdur 120 mg daily.

## 2012-09-20 NOTE — Patient Instructions (Addendum)
Your physician wants you to follow-up in: 6 months.   You will receive a reminder letter in the mail two months in advance. If you don't receive a letter, please call our office to schedule the follow-up appointment.  Your physician has recommended you make the following change in your medication: INCREASE your Imdur to 120mg  daily

## 2012-09-20 NOTE — Progress Notes (Signed)
HPI  Patient is seen post hospitalization to followup coronary disease. Recently he was hospitalized and required repeat catheterization. His vessels looked excellent. There have been no significant progression. He is actually doing well. Unfortunately his finances change such that he could not afford his medicines. He and his wife are making progress getting Medicaid and Medicare and this will be Solved soon.  Allergies  Allergen Reactions  . Morphine     REACTION: hallucinations    Current Outpatient Prescriptions  Medication Sig Dispense Refill  . aspirin EC 81 MG tablet Take 81 mg by mouth at bedtime.       . diclofenac sodium (VOLTAREN) 1 % GEL Apply 1 application topically 2 (two) times daily as needed. For pain to affected area       . fish oil-omega-3 fatty acids 1000 MG capsule Take 2 g by mouth 2 (two) times daily.      Marland Kitchen gabapentin (NEURONTIN) 300 MG capsule Take 300 mg by mouth 3 (three) times daily.      . insulin glargine (LANTUS SOLOSTAR) 100 UNIT/ML injection Inject 40 Units into the skin at bedtime.      . insulin lispro (HUMALOG) 100 UNIT/ML injection Inject into the skin as directed.      . isosorbide mononitrate (IMDUR) 60 MG 24 hr tablet Take 1 tablet (60 mg total) by mouth daily.  30 tablet  6  . levothyroxine (SYNTHROID, LEVOTHROID) 75 MCG tablet Take 75 mcg by mouth daily.      . metFORMIN (GLUCOPHAGE) 1000 MG tablet Take 1 tablet (1,000 mg total) by mouth 2 (two) times daily with a meal.  60 tablet  1  . nebivolol (BYSTOLIC) 10 MG tablet Take 1 tablet (10 mg total) by mouth daily.  30 tablet  6  . nitroGLYCERIN (NITROSTAT) 0.4 MG SL tablet Place 1 tablet (0.4 mg total) under the tongue every 5 (five) minutes as needed (up to 3 doses).  25 tablet  4  . ondansetron (ZOFRAN) 8 MG tablet Take 8 mg by mouth every 12 (twelve) hours as needed. For nausea      . pantoprazole (PROTONIX) 40 MG tablet Take 40 mg by mouth daily.      . pramipexole (MIRAPEX) 0.25 MG tablet  Take 0.25 mg by mouth at bedtime.      . sertraline (ZOLOFT) 100 MG tablet Take 100 mg by mouth 2 (two) times daily.      . simvastatin (ZOCOR) 40 MG tablet Take 1 tablet (40 mg total) by mouth every evening.  30 tablet  6    History   Social History  . Marital Status: Married    Spouse Name: N/A    Number of Children: N/A  . Years of Education: N/A   Occupational History  . Not on file.   Social History Main Topics  . Smoking status: Never Smoker   . Smokeless tobacco: Never Used  . Alcohol Use: No  . Drug Use: No  . Sexually Active: Not on file   Other Topics Concern  . Not on file   Social History Narrative  . No narrative on file    Family History  Problem Relation Age of Onset  . Cancer Brother     pancreatic cancer  . Diabetes Brother     4   . Stroke Brother   . Diabetes Brother   . Cancer Brother   . Diabetes Brother   . Coronary artery disease Brother   . Diabetes  Brother   . Hypothyroidism Brother   . Heart attack      Nephew    Past Medical History  Diagnosis Date  . Diabetes mellitus type II, uncontrolled   . OSA (obstructive sleep apnea)   . Hyperlipidemia   . RBBB (right bundle branch block)   . Depression     Bipolar  . Edema   . Hypertension   . Falling episodes     these have occurred in the past and again recurring 2011  . Spondylosis     C5-6, C6-7 MRI 2010  . CAD (coronary artery disease)     A. CABG in 2000,status post cardiac cath in 2006, 2009 ....continued chest pain and SOB despite oral medication adjestments including Ranexa. B. Cath November 2009/ DES  Stent...11/26/2008 to distal  RCA leading to acute marginal.  C. Cath 07/2012 for CP - stable anatomy, med rx.  Marland Kitchen Hx of CABG     2000,  / one median sternotomy suture broken her chest x-ray November, 2010, no clinical significance  . Palpitations     event recorder showed sinus rhythm  . Nephrolithiasis   . Restless leg   . Dizziness   . Shortness of breath     CPX April,  2011, mild functional limitation, no clear pulmonary or cardiac limitation, possible deconditioning and mild chronotropic incompetence( peak heart rate 130)  . Cerebral ischemia     MRI November, 2010, chronic microvascular ischemia  . Anemia     hemoglobin 7.4, iron deficiency, January, 2011, 2 unit transfusion, endoscopy normal, capsule endoscopy February, 2011 no small bowel abnormalities.   Most likely source gastric erosions, followed by GI  . H/O medication noncompliance     Due to loss of insurance  . Ejection fraction     EF 60%, echo, July 31, 2012    Past Surgical History  Procedure Date  . Nasal septum surgery     UP3  . Coronary artery bypass graft     2000    Patient Active Problem List  Diagnosis  . HYPOTHYROIDISM  . DEPRESSIVE DISORDER NOT ELSEWHERE CLASSIFIED  . RESTLESS LEG SYNDROME  . BENIGN POSITIONAL VERTIGO  . RBBB  . GERD  . SCIATICA  . LEG PAIN, BILATERAL  . DIZZINESS  . TUBULOVILLOUS ADENOMA, COLON, HX OF  . NEPHROLITHIASIS, HX OF  . FATIGUE  . Insomnia  . CAD (coronary artery disease)  . Anemia  . OSA (obstructive sleep apnea)  . Hyperlipidemia  . RBBB (right bundle branch block)  . Edema  . Hypertension  . Falling episodes  . Hx of CABG  . Palpitations  . Restless leg  . Dizziness  . Shortness of breath  . Diabetes mellitus type II, uncontrolled  . Chest pain  . Ejection fraction    ROS   Patient denies fever, chills, headache, sweats, rash, change in vision, change in hearing, chest pain, cough, nausea vomiting, urinary symptoms. All other systems are reviewed and are negative.  PHYSICAL EXAM  Patient is stable. He is oriented to person time and place. Affect is normal. He's here with his wife. There is no jugulovenous distention. Lungs are clear. Respiratory effort is nonlabored. Cardiac exam reveals S1 and S2. There no clicks or significant murmurs. The abdomen is soft. There is no peripheral edema.  Filed Vitals:   09/20/12  1428  BP: 114/60  Pulse: 70  Height: 5\' 9"  (1.753 m)  Weight: 182 lb 12.8 oz (82.918 kg)  SpO2: 98%  ASSESSMENT & PLAN

## 2012-09-20 NOTE — Telephone Encounter (Signed)
Pt is coming in for ov tomorrow at 3:45pm to see Dr Shawna Orleans. Pt is wondering if he needs to get his a1c checked that morning prior to ov?

## 2012-09-21 ENCOUNTER — Ambulatory Visit (INDEPENDENT_AMBULATORY_CARE_PROVIDER_SITE_OTHER): Payer: Medicaid Other | Admitting: Internal Medicine

## 2012-09-21 ENCOUNTER — Encounter: Payer: Self-pay | Admitting: Internal Medicine

## 2012-09-21 VITALS — BP 130/78 | HR 72 | Temp 97.6°F | Wt 184.0 lb

## 2012-09-21 DIAGNOSIS — IMO0002 Reserved for concepts with insufficient information to code with codable children: Secondary | ICD-10-CM

## 2012-09-21 DIAGNOSIS — IMO0001 Reserved for inherently not codable concepts without codable children: Secondary | ICD-10-CM

## 2012-09-21 DIAGNOSIS — R1032 Left lower quadrant pain: Secondary | ICD-10-CM

## 2012-09-21 DIAGNOSIS — E785 Hyperlipidemia, unspecified: Secondary | ICD-10-CM

## 2012-09-21 DIAGNOSIS — E039 Hypothyroidism, unspecified: Secondary | ICD-10-CM

## 2012-09-21 DIAGNOSIS — Z23 Encounter for immunization: Secondary | ICD-10-CM

## 2012-09-21 DIAGNOSIS — R1031 Right lower quadrant pain: Secondary | ICD-10-CM | POA: Insufficient documentation

## 2012-09-21 DIAGNOSIS — E1165 Type 2 diabetes mellitus with hyperglycemia: Secondary | ICD-10-CM

## 2012-09-21 DIAGNOSIS — I2510 Atherosclerotic heart disease of native coronary artery without angina pectoris: Secondary | ICD-10-CM

## 2012-09-21 DIAGNOSIS — R109 Unspecified abdominal pain: Secondary | ICD-10-CM

## 2012-09-21 DIAGNOSIS — I10 Essential (primary) hypertension: Secondary | ICD-10-CM

## 2012-09-21 MED ORDER — PRAMIPEXOLE DIHYDROCHLORIDE 0.25 MG PO TABS: 0.2500 mg | ORAL_TABLET | Freq: Every day | ORAL | Status: DC

## 2012-09-21 MED ORDER — SIMVASTATIN 40 MG PO TABS: 40.0000 mg | ORAL_TABLET | Freq: Every evening | ORAL | Status: DC

## 2012-09-21 MED ORDER — LEVOTHYROXINE SODIUM 75 MCG PO TABS: 75.0000 ug | ORAL_TABLET | Freq: Every day | ORAL | Status: DC

## 2012-09-21 MED ORDER — FUROSEMIDE 20 MG PO TABS: 20.0000 mg | ORAL_TABLET | Freq: Every day | ORAL | Status: DC

## 2012-09-21 MED ORDER — METFORMIN HCL 1000 MG PO TABS: 1000.0000 mg | ORAL_TABLET | Freq: Two times a day (BID) | ORAL | Status: DC

## 2012-09-21 MED ORDER — PANTOPRAZOLE SODIUM 40 MG PO TBEC: 40.0000 mg | DELAYED_RELEASE_TABLET | Freq: Every day | ORAL | Status: DC

## 2012-09-21 MED ORDER — INSULIN GLARGINE 100 UNIT/ML ~~LOC~~ SOLN: 40.0000 [IU] | Freq: Every day | SUBCUTANEOUS | Status: DC

## 2012-09-21 MED ORDER — SERTRALINE HCL 100 MG PO TABS: 100.0000 mg | ORAL_TABLET | Freq: Two times a day (BID) | ORAL | Status: DC

## 2012-09-21 MED ORDER — INSULIN LISPRO 100 UNIT/ML ~~LOC~~ SOLN: SUBCUTANEOUS | Status: DC

## 2012-09-21 NOTE — Assessment & Plan Note (Addendum)
Bilateral groin pain of unclear etiology.  He does not have any obvious hernia on exam. He has mild tenderness of right inguinal area. Testicular exam is normal.  Symptoms may be secondary to muscular strain. Observe for now.  Patient advised to call office if symptoms persist or worsen.

## 2012-09-21 NOTE — Progress Notes (Signed)
Subjective:    Patient ID: [name] [name], male    DOB: 05/09/1946, 66 y.o.   MRN: [id number]  HPI  66 year old white male with history of coronary artery disease, type 2 diabetes uncontrolled, anemia, hypertension and hypothyroidism for follow up. Has been several months since his previous office visit. Patient lost his health insurance. He was seen by his cardiologist in August of 2013 secondary to chest pain. Cardiac cath was performed. Fortunately he has stable coronary artery disease.  Type 2 diabetes-he has a brother who uses the same insulins.  he has taken some insulin but his blood sugars have been suboptimally controlled.  Hypertension-he has managed to obtain samples of bystolic.  Hypothyroidism-he got out of thyroid medication months ago.   Patient complains of intermittent bilateral inguinal pain. He has not noticed any inguinal protrusions. He denies any testicular discomfort.  He has been experiencing significant major she had night. He has not taken his Mirapex for several months.  Review of Systems Positive for fatigue, negative for dysuria    Past Medical History  Diagnosis Date  . Diabetes mellitus type II, uncontrolled   . OSA (obstructive sleep apnea)   . Hyperlipidemia   . RBBB (right bundle branch block)   . Depression     Bipolar  . Edema   . Hypertension   . Falling episodes     these have occurred in the past and again recurring 2011  . Spondylosis     C5-6, C6-7 MRI 2010  . CAD (coronary artery disease)     A. CABG in 2000,status post cardiac cath in 2006, 2009 ....continued chest pain and SOB despite oral medication adjestments including Ranexa. B. Cath November 2009/ DES  Stent...11/26/2008 to distal  RCA leading to acute marginal.  C. Cath 07/2012 for CP - stable anatomy, med rx.  Marland Kitchen Hx of CABG     2000,  / one median sternotomy suture broken her chest x-ray November, 2010, no clinical significance  . Palpitations     event recorder showed sinus  rhythm  . Nephrolithiasis   . Restless leg   . Dizziness   . Shortness of breath     CPX April, 2011, mild functional limitation, no clear pulmonary or cardiac limitation, possible deconditioning and mild chronotropic incompetence( peak heart rate 130)  . Cerebral ischemia     MRI November, 2010, chronic microvascular ischemia  . Anemia     hemoglobin 7.4, iron deficiency, January, 2011, 2 unit transfusion, endoscopy normal, capsule endoscopy February, 2011 no small bowel abnormalities.   Most likely source gastric erosions, followed by GI  . H/O medication noncompliance     Due to loss of insurance  . Ejection fraction     EF 60%, echo, July 31, 2012    History   Social History  . Marital Status: Married    Spouse Name: N/A    Number of Children: N/A  . Years of Education: N/A   Occupational History  . Not on file.   Social History Main Topics  . Smoking status: Never Smoker   . Smokeless tobacco: Never Used  . Alcohol Use: No  . Drug Use: No  . Sexually Active: Not on file   Other Topics Concern  . Not on file   Social History Narrative  . No narrative on file    Past Surgical History  Procedure Date  . Nasal septum surgery     UP3  . Coronary artery bypass graft  2000    Family History  Problem Relation Age of Onset  . Cancer Brother     pancreatic cancer  . Diabetes Brother     4   . Stroke Brother   . Diabetes Brother   . Cancer Brother   . Diabetes Brother   . Coronary artery disease Brother   . Diabetes Brother   . Hypothyroidism Brother   . Heart attack      Nephew    Allergies  Allergen Reactions  . Morphine     REACTION: hallucinations    Current Outpatient Prescriptions on File Prior to Visit  Medication Sig Dispense Refill  . aspirin EC 81 MG tablet Take 81 mg by mouth at bedtime.       . diclofenac sodium (VOLTAREN) 1 % GEL Apply 1 application topically 2 (two) times daily as needed. For pain to affected area       . fish  oil-omega-3 fatty acids 1000 MG capsule Take 2 g by mouth 2 (two) times daily.      Marland Kitchen gabapentin (NEURONTIN) 300 MG capsule Take 300 mg by mouth 3 (three) times daily.      . isosorbide mononitrate (IMDUR) 120 MG 24 hr tablet Take 1 tablet (120 mg total) by mouth daily.  30 tablet  6  . nebivolol (BYSTOLIC) 10 MG tablet Take 1 tablet (10 mg total) by mouth daily.  30 tablet  6  . nitroGLYCERIN (NITROSTAT) 0.4 MG SL tablet Place 1 tablet (0.4 mg total) under the tongue every 5 (five) minutes as needed (up to 3 doses).  25 tablet  4    BP 130/78  Pulse 72  Temp 97.6 F (36.4 C) (Oral)  Wt 184 lb (83.462 kg)    Objective:   Physical Exam  Constitutional: He is oriented to person, place, and time. He appears well-developed and well-nourished. No distress.  HENT:  Head: Normocephalic and atraumatic.  Neck: Neck supple.  Cardiovascular: Normal rate and normal heart sounds.   Pulmonary/Chest: Effort normal and breath sounds normal. He has no wheezes.  Abdominal: Soft. Bowel sounds are normal. He exhibits no mass.       No inguinal hernia  Genitourinary:       No testicular abnormality  Musculoskeletal: He exhibits no edema.  Lymphadenopathy:    He has no cervical adenopathy.  Neurological: He is alert and oriented to person, place, and time. No cranial nerve deficit.  Psychiatric: He has a normal mood and affect. His behavior is normal.          Assessment & Plan:

## 2012-09-21 NOTE — Assessment & Plan Note (Signed)
Blood pressure stable. Continue bystolic 10 mg once daily. Refilled Lasix 20 mg once daily.  Monitor electrolytes and kidney function.

## 2012-09-21 NOTE — Telephone Encounter (Signed)
Called pt and told pt he could wait till OV, he does not need to be fasting, pt verbalized understanding

## 2012-09-21 NOTE — Assessment & Plan Note (Addendum)
He has not taken thyroid medications for several months. He is likely to significantly hypothyroid. Patient advised to take half dose of 75 mcg of Synthroid for 2 weeks then take full dose thereafter.  Monitor TFTs

## 2012-09-21 NOTE — Assessment & Plan Note (Signed)
Patient has been taking some insulin he obtained from his family members. Samples of Lantus and Humalog provided. Hopefully his insurance will cover prescription for his insulin.  Monitor A1c and serum creatinine

## 2012-09-22 LAB — CBC WITH DIFFERENTIAL/PLATELET
Basophils Relative: 0.7 % (ref 0.0–3.0)
HCT: 43.8 % (ref 39.0–52.0)
Hemoglobin: 14.4 g/dL (ref 13.0–17.0)
Lymphocytes Relative: 31.1 % (ref 12.0–46.0)
Lymphs Abs: 2.4 10*3/uL (ref 0.7–4.0)
MCHC: 32.8 g/dL (ref 30.0–36.0)
Monocytes Relative: 7.1 % (ref 3.0–12.0)
Neutro Abs: 4.6 10*3/uL (ref 1.4–7.7)
RBC: 4.62 Mil/uL (ref 4.22–5.81)

## 2012-09-22 LAB — HEPATIC FUNCTION PANEL
ALT: 27 U/L (ref 0–53)
Albumin: 4.1 g/dL (ref 3.5–5.2)
Alkaline Phosphatase: 72 U/L (ref 39–117)
Bilirubin, Direct: 0.1 mg/dL (ref 0.0–0.3)
Total Protein: 7.2 g/dL (ref 6.0–8.3)

## 2012-09-22 LAB — BASIC METABOLIC PANEL
BUN: 19 mg/dL (ref 6–23)
Creatinine, Ser: 1.1 mg/dL (ref 0.4–1.5)
GFR: 72.38 mL/min (ref >60.00)
Potassium: 4.8 mEq/L (ref 3.5–5.1)

## 2012-09-22 LAB — LDL CHOLESTEROL, DIRECT: Direct LDL: 46.5 mg/dL

## 2012-09-22 LAB — TSH: TSH: 3.12 u[IU]/mL (ref 0.35–5.50)

## 2012-10-24 ENCOUNTER — Ambulatory Visit (INDEPENDENT_AMBULATORY_CARE_PROVIDER_SITE_OTHER): Payer: Medicaid Other | Admitting: Internal Medicine

## 2012-10-24 ENCOUNTER — Encounter: Payer: Self-pay | Admitting: Internal Medicine

## 2012-10-24 VITALS — BP 112/62 | HR 76 | Temp 98.0°F | Wt 180.0 lb

## 2012-10-24 DIAGNOSIS — IMO0002 Reserved for concepts with insufficient information to code with codable children: Secondary | ICD-10-CM

## 2012-10-24 DIAGNOSIS — L309 Dermatitis, unspecified: Secondary | ICD-10-CM

## 2012-10-24 DIAGNOSIS — M25511 Pain in right shoulder: Secondary | ICD-10-CM

## 2012-10-24 DIAGNOSIS — IMO0001 Reserved for inherently not codable concepts without codable children: Secondary | ICD-10-CM

## 2012-10-24 DIAGNOSIS — L259 Unspecified contact dermatitis, unspecified cause: Secondary | ICD-10-CM

## 2012-10-24 DIAGNOSIS — M25519 Pain in unspecified shoulder: Secondary | ICD-10-CM

## 2012-10-24 DIAGNOSIS — E039 Hypothyroidism, unspecified: Secondary | ICD-10-CM

## 2012-10-24 DIAGNOSIS — E1165 Type 2 diabetes mellitus with hyperglycemia: Secondary | ICD-10-CM

## 2012-10-24 LAB — TSH: TSH: 1.78 u[IU]/mL (ref 0.35–5.50)

## 2012-10-24 MED ORDER — DICLOFENAC SODIUM 1 % TD GEL: 2.0000 g | Freq: Three times a day (TID) | TRANSDERMAL | Status: DC | PRN

## 2012-10-24 MED ORDER — INSULIN ASPART 100 UNIT/ML ~~LOC~~ SOLN: SUBCUTANEOUS | Status: DC

## 2012-10-24 MED ORDER — CLOTRIMAZOLE-BETAMETHASONE 1-0.05 % EX CREA: TOPICAL_CREAM | Freq: Two times a day (BID) | CUTANEOUS | Status: DC

## 2012-10-24 NOTE — Assessment & Plan Note (Signed)
Monitor TFTs.  Patient recently resumed his normal dose.

## 2012-10-24 NOTE — Assessment & Plan Note (Signed)
Diabetes is poorly controlled.  Continue same dose of Lantus. Change Humalog to NovoLog due to insurance reasons. Dietary compliance encouraged. Lab Results  Component Value Date   HGBA1C 9.6* 09/21/2012

## 2012-10-24 NOTE — Progress Notes (Signed)
Subjective:    Patient ID: [name], male    DOB: [birth year], 66 y.o.   MRN: [id number]  HPI  66 year old white male with history of coronary artery disease, uncontrolled type 2 diabetes, hypothyroidism and hypertension for follow up.  Previous labs showed TSH was between 3 and 4. Patient reports he was taking half dose of his brothers thyroid medication. He has resumed his usual dose of levothyroxine.  Type 2 diabetes-his blood sugars have been improving since he restarted his insulin use. He denies any hypoglycemia.  He complains of scaly patch of skin in his right palm. It is mildly pruritic.  Patient also complains of intermittent right shoulder pain. He has been doing more chores around the house. His pain is worse with raising his right arm. Pain localized to right a.c. Joint. He also has discomfort when he slid on his right side.  Review of Systems Negative for chest pain or shortness of breath  Past Medical History  Diagnosis Date  . Diabetes mellitus type II, uncontrolled   . OSA (obstructive sleep apnea)   . Hyperlipidemia   . RBBB (right bundle branch block)   . Depression     Bipolar  . Edema   . Hypertension   . Falling episodes     these have occurred in the past and again recurring 2011  . Spondylosis     C5-6, C6-7 MRI 2010  . CAD (coronary artery disease)     A. CABG in 2000,status post cardiac cath in 2006, 2009 ....continued chest pain and SOB despite oral medication adjestments including Ranexa. B. Cath November 2009/ DES  Stent...11/26/2008 to distal  RCA leading to acute marginal.  C. Cath 07/2012 for CP - stable anatomy, med rx.  Marland Kitchen Hx of CABG     2000,  / one median sternotomy suture broken her chest x-ray November, 2010, no clinical significance  . Palpitations     event recorder showed sinus rhythm  . Nephrolithiasis   . Restless leg   . Dizziness   . Shortness of breath     CPX April, 2011, mild functional limitation, no clear pulmonary or  cardiac limitation, possible deconditioning and mild chronotropic incompetence( peak heart rate 130)  . Cerebral ischemia     MRI November, 2010, chronic microvascular ischemia  . Anemia     hemoglobin 7.4, iron deficiency, January, 2011, 2 unit transfusion, endoscopy normal, capsule endoscopy February, 2011 no small bowel abnormalities.   Most likely source gastric erosions, followed by GI  . H/O medication noncompliance     Due to loss of insurance  . Ejection fraction     EF 60%, echo, July 31, 2012    History   Social History  . Marital Status: Married    Spouse Name: N/A    Number of Children: N/A  . Years of Education: N/A   Occupational History  . Not on file.   Social History Main Topics  . Smoking status: Never Smoker   . Smokeless tobacco: Never Used  . Alcohol Use: No  . Drug Use: No  . Sexually Active: Not on file   Other Topics Concern  . Not on file   Social History Narrative  . No narrative on file    Past Surgical History  Procedure Date  . Nasal septum surgery     UP3  . Coronary artery bypass graft     2000    Family History  Problem Relation Age of Onset  .  Cancer Brother     pancreatic cancer  . Diabetes Brother     4   . Stroke Brother   . Diabetes Brother   . Cancer Brother   . Diabetes Brother   . Coronary artery disease Brother   . Diabetes Brother   . Hypothyroidism Brother   . Heart attack      Nephew    Allergies  Allergen Reactions  . Morphine     REACTION: hallucinations    Current Outpatient Prescriptions on File Prior to Visit  Medication Sig Dispense Refill  . aspirin EC 81 MG tablet Take 81 mg by mouth at bedtime.       . fish oil-omega-3 fatty acids 1000 MG capsule Take 2 g by mouth 2 (two) times daily.      . furosemide (LASIX) 20 MG tablet Take 1 tablet (20 mg total) by mouth daily.  90 tablet  1  . gabapentin (NEURONTIN) 300 MG capsule Take 300 mg by mouth 3 (three) times daily.      . insulin glargine  (LANTUS SOLOSTAR) 100 UNIT/ML injection Inject 40 Units into the skin at bedtime.  15 mL  5  . isosorbide mononitrate (IMDUR) 120 MG 24 hr tablet Take 1 tablet (120 mg total) by mouth daily.  30 tablet  6  . levothyroxine (SYNTHROID, LEVOTHROID) 75 MCG tablet Take 1 tablet (75 mcg total) by mouth daily.  90 tablet  1  . metFORMIN (GLUCOPHAGE) 1000 MG tablet Take 1 tablet (1,000 mg total) by mouth 2 (two) times daily with a meal.  180 tablet  1  . nebivolol (BYSTOLIC) 10 MG tablet Take 1 tablet (10 mg total) by mouth daily.  30 tablet  6  . nitroGLYCERIN (NITROSTAT) 0.4 MG SL tablet Place 1 tablet (0.4 mg total) under the tongue every 5 (five) minutes as needed (up to 3 doses).  25 tablet  4  . pantoprazole (PROTONIX) 40 MG tablet Take 1 tablet (40 mg total) by mouth daily.  90 tablet  1  . pramipexole (MIRAPEX) 0.25 MG tablet Take 1 tablet (0.25 mg total) by mouth at bedtime.  90 tablet  1  . sertraline (ZOLOFT) 100 MG tablet Take 1 tablet (100 mg total) by mouth 2 (two) times daily.  180 tablet  1  . simvastatin (ZOCOR) 40 MG tablet Take 1 tablet (40 mg total) by mouth every evening.  90 tablet  1  . insulin aspart (NOVOLOG FLEXPEN) 100 UNIT/ML injection Use 5 to 15 unit 3 times daily before meals  15 mL  5    BP 112/62  Pulse 76  Temp 98 F (36.7 C) (Oral)  Wt 180 lb (81.647 kg)       Objective:   Physical Exam  Constitutional: He is oriented to person, place, and time. He appears well-developed and well-nourished.  HENT:  Head: Normocephalic and atraumatic.  Neck: Neck supple.  Cardiovascular: Normal rate, regular rhythm and normal heart sounds.   Pulmonary/Chest: Effort normal and breath sounds normal. He has no wheezes.  Musculoskeletal: He exhibits no edema.       Pain at right [facility] joint with abduction of right arm  Neurological: He is alert and oriented to person, place, and time.  Skin: Skin is warm and dry.       Scaly area right palm and base of thumb  Psychiatric: He has  a normal mood and affect. His behavior is normal.  Assessment & Plan:

## 2012-10-24 NOTE — Patient Instructions (Signed)
Check your blood sugars 3 times daily Bring your glucometer to your next follow up appointment

## 2012-10-24 NOTE — Assessment & Plan Note (Signed)
Patient having intermittent right shoulder pain. He has positive impingement sign. Symptoms likely secondary to tendinitis of right supraspinatus. Use diclofenac gel 3 times a day as directed. Patient advised to avoid strenuous activity.

## 2012-10-24 NOTE — Assessment & Plan Note (Signed)
Patient has scaly area on right palm and base of right thumb consistent with eczema. Use Lotrisone cream as directed for 2 weeks.

## 2012-10-26 ENCOUNTER — Other Ambulatory Visit: Payer: Self-pay | Admitting: *Deleted

## 2012-10-26 ENCOUNTER — Telehealth: Payer: Self-pay | Admitting: *Deleted

## 2012-10-26 MED ORDER — PRAMIPEXOLE DIHYDROCHLORIDE 0.25 MG PO TABS: 0.2500 mg | ORAL_TABLET | Freq: Every day | ORAL | Status: DC

## 2012-10-26 NOTE — Telephone Encounter (Signed)
Please call in generic mirapex 0.25 mg # 90 one po qhs. RF x 1.

## 2012-10-26 NOTE — Telephone Encounter (Signed)
Pt aware, rx sent in electronically 

## 2012-10-26 NOTE — Telephone Encounter (Signed)
Pt wants to know if you can him something for restless leg syndrome.  He states that you have rx'd it for him in the past

## 2012-11-30 ENCOUNTER — Ambulatory Visit (INDEPENDENT_AMBULATORY_CARE_PROVIDER_SITE_OTHER): Payer: Medicaid Other | Admitting: Internal Medicine

## 2012-11-30 ENCOUNTER — Encounter: Payer: Self-pay | Admitting: Internal Medicine

## 2012-11-30 VITALS — BP 114/62 | HR 76 | Temp 97.9°F | Wt 182.0 lb

## 2012-11-30 DIAGNOSIS — IMO0001 Reserved for inherently not codable concepts without codable children: Secondary | ICD-10-CM

## 2012-11-30 DIAGNOSIS — IMO0002 Reserved for concepts with insufficient information to code with codable children: Secondary | ICD-10-CM

## 2012-11-30 DIAGNOSIS — M25519 Pain in unspecified shoulder: Secondary | ICD-10-CM

## 2012-11-30 DIAGNOSIS — E1165 Type 2 diabetes mellitus with hyperglycemia: Secondary | ICD-10-CM

## 2012-11-30 DIAGNOSIS — I10 Essential (primary) hypertension: Secondary | ICD-10-CM

## 2012-11-30 DIAGNOSIS — M25511 Pain in right shoulder: Secondary | ICD-10-CM

## 2012-11-30 DIAGNOSIS — Z23 Encounter for immunization: Secondary | ICD-10-CM

## 2012-11-30 NOTE — Assessment & Plan Note (Signed)
Well controlled.  Improved with medication compliance.  BP: 114/62 mmHg  Lab Results  Component Value Date   CREATININE 1.1 09/21/2012   Monitor kidney function and electrolytes.

## 2012-11-30 NOTE — Assessment & Plan Note (Addendum)
Blood sugars seem to be stable but A1c is suboptimally controlled. Patient advised to increase his mealtime insulin so that his postprandial blood sugars are closer to 150.  Monitor A1c.  Goal A1c - mid 7's Lab Results  Component Value Date   HGBA1C 9.6* 09/21/2012   Patient reports for last several weeks he reports loose stools. He has been on same dose of metformin for years. Patient to try it taking half dose of 1 g of metformin.

## 2012-11-30 NOTE — Assessment & Plan Note (Signed)
Improved with rest and diclofenac gel.

## 2012-11-30 NOTE — Progress Notes (Signed)
Subjective:    Patient ID: [name] [name], male    DOB: 26-Sep-1946, 66 y.o.   MRN: [id number]  HPI  66 year old white male with uncontrolled type 2 diabetes, coronary artery disease and hypertension for routine followup. Patient previously seen for right shoulder pain. He has been using diclofenac gel as needed. He has refrained from strenuous activity as directed. Right shoulder pain has improved.  Type 2 diabetes - his blood sugars are fairly stable. He does not always use his mealtime insulin. He denies any hypoglycemic episodes.  Review of Systems Negative for chest pain or shortness of breath  Past Medical History  Diagnosis Date  . Diabetes mellitus type II, uncontrolled   . OSA (obstructive sleep apnea)   . Hyperlipidemia   . RBBB (right bundle branch block)   . Depression     Bipolar  . Edema   . Hypertension   . Falling episodes     these have occurred in the past and again recurring 2011  . Spondylosis     C5-6, C6-7 MRI 2010  . CAD (coronary artery disease)     A. CABG in 2000,status post cardiac cath in 2006, 2009 ....continued chest pain and SOB despite oral medication adjestments including Ranexa. B. Cath November 2009/ DES  Stent...11/26/2008 to distal  RCA leading to acute marginal.  C. Cath 07/2012 for CP - stable anatomy, med rx.  Marland Kitchen Hx of CABG     2000,  / one median sternotomy suture broken her chest x-ray November, 2010, no clinical significance  . Palpitations     event recorder showed sinus rhythm  . Nephrolithiasis   . Restless leg   . Dizziness   . Shortness of breath     CPX April, 2011, mild functional limitation, no clear pulmonary or cardiac limitation, possible deconditioning and mild chronotropic incompetence( peak heart rate 130)  . Cerebral ischemia     MRI November, 2010, chronic microvascular ischemia  . Anemia     hemoglobin 7.4, iron deficiency, January, 2011, 2 unit transfusion, endoscopy normal, capsule endoscopy February, 2011 no small  bowel abnormalities.   Most likely source gastric erosions, followed by GI  . H/O medication noncompliance     Due to loss of insurance  . Ejection fraction     EF 60%, echo, July 31, 2012    History   Social History  . Marital Status: Married    Spouse Name: N/A    Number of Children: N/A  . Years of Education: N/A   Occupational History  . Not on file.   Social History Main Topics  . Smoking status: Never Smoker   . Smokeless tobacco: Never Used  . Alcohol Use: No  . Drug Use: No  . Sexually Active: Not on file   Other Topics Concern  . Not on file   Social History Narrative  . No narrative on file    Past Surgical History  Procedure Date  . Nasal septum surgery     UP3  . Coronary artery bypass graft     2000    Family History  Problem Relation Age of Onset  . Cancer Brother     pancreatic cancer  . Diabetes Brother     4   . Stroke Brother   . Diabetes Brother   . Cancer Brother   . Diabetes Brother   . Coronary artery disease Brother   . Diabetes Brother   . Hypothyroidism Brother   . Heart attack  Nephew    Allergies  Allergen Reactions  . Morphine     REACTION: hallucinations    Current Outpatient Prescriptions on File Prior to Visit  Medication Sig Dispense Refill  . aspirin EC 81 MG tablet Take 81 mg by mouth at bedtime.       . clotrimazole-betamethasone (LOTRISONE) cream Apply topically 2 (two) times daily.  30 g  2  . diclofenac sodium (VOLTAREN) 1 % GEL Apply 2 g topically 3 (three) times daily as needed. For pain to affected area  2 Tube  3  . fish oil-omega-3 fatty acids 1000 MG capsule Take 2 g by mouth 2 (two) times daily.      . furosemide (LASIX) 20 MG tablet Take 1 tablet (20 mg total) by mouth daily.  90 tablet  1  . gabapentin (NEURONTIN) 300 MG capsule Take 300 mg by mouth 3 (three) times daily.      . insulin aspart (NOVOLOG FLEXPEN) 100 UNIT/ML injection Use 5 to 15 unit 3 times daily before meals  15 mL  5  .  insulin glargine (LANTUS SOLOSTAR) 100 UNIT/ML injection Inject 40 Units into the skin at bedtime.  15 mL  5  . isosorbide mononitrate (IMDUR) 120 MG 24 hr tablet Take 1 tablet (120 mg total) by mouth daily.  30 tablet  6  . levothyroxine (SYNTHROID, LEVOTHROID) 75 MCG tablet Take 1 tablet (75 mcg total) by mouth daily.  90 tablet  1  . metFORMIN (GLUCOPHAGE) 1000 MG tablet Take 1 tablet (1,000 mg total) by mouth 2 (two) times daily with a meal.  180 tablet  1  . nebivolol (BYSTOLIC) 10 MG tablet Take 1 tablet (10 mg total) by mouth daily.  30 tablet  6  . nitroGLYCERIN (NITROSTAT) 0.4 MG SL tablet Place 1 tablet (0.4 mg total) under the tongue every 5 (five) minutes as needed (up to 3 doses).  25 tablet  4  . pantoprazole (PROTONIX) 40 MG tablet Take 1 tablet (40 mg total) by mouth daily.  90 tablet  1  . pramipexole (MIRAPEX) 0.25 MG tablet Take 1 tablet (0.25 mg total) by mouth at bedtime.  90 tablet  1  . sertraline (ZOLOFT) 100 MG tablet Take 1 tablet (100 mg total) by mouth 2 (two) times daily.  180 tablet  1  . simvastatin (ZOCOR) 40 MG tablet Take 1 tablet (40 mg total) by mouth every evening.  90 tablet  1    BP 114/62  Pulse 76  Temp 97.9 F (36.6 C) (Oral)  Wt 182 lb (82.555 kg)       Objective:   Physical Exam  Constitutional: He appears well-developed and well-nourished.  Cardiovascular: Normal rate, regular rhythm and normal heart sounds.   Pulmonary/Chest: Effort normal and breath sounds normal. He has no wheezes.  Musculoskeletal: He exhibits no edema.  Skin: Skin is warm and dry.          Assessment & Plan:

## 2012-12-01 LAB — BASIC METABOLIC PANEL
Calcium: 9.6 mg/dL (ref 8.4–10.5)
Creatinine, Ser: 1.2 mg/dL (ref 0.4–1.5)
GFR: 68 mL/min (ref >60.00)
Glucose, Bld: 129 mg/dL — ABNORMAL HIGH (ref 70–99)
Sodium: 141 mEq/L (ref 135–145)

## 2012-12-01 LAB — MICROALBUMIN / CREATININE URINE RATIO: Creatinine,U: 71.4 mg/dL

## 2013-02-16 ENCOUNTER — Telehealth: Payer: Self-pay | Admitting: Internal Medicine

## 2013-02-16 MED ORDER — GLUCOSE BLOOD VI STRP: ORAL_STRIP | Status: DC

## 2013-02-16 NOTE — Telephone Encounter (Signed)
Pt needs new rx for test strips for contour next EZ machine call into [facility] [phone]

## 2013-02-16 NOTE — Telephone Encounter (Signed)
rx for strips call in to [facility]

## 2013-03-02 ENCOUNTER — Ambulatory Visit: Payer: Medicare Other | Admitting: Internal Medicine

## 2013-03-06 ENCOUNTER — Ambulatory Visit (INDEPENDENT_AMBULATORY_CARE_PROVIDER_SITE_OTHER): Payer: Medicare Other | Admitting: Internal Medicine

## 2013-03-06 ENCOUNTER — Encounter: Payer: Self-pay | Admitting: Internal Medicine

## 2013-03-06 VITALS — BP 130/70 | Temp 98.3°F | Wt 184.0 lb

## 2013-03-06 DIAGNOSIS — I10 Essential (primary) hypertension: Secondary | ICD-10-CM

## 2013-03-06 DIAGNOSIS — IMO0001 Reserved for inherently not codable concepts without codable children: Secondary | ICD-10-CM

## 2013-03-06 DIAGNOSIS — IMO0002 Reserved for concepts with insufficient information to code with codable children: Secondary | ICD-10-CM

## 2013-03-06 DIAGNOSIS — E1165 Type 2 diabetes mellitus with hyperglycemia: Secondary | ICD-10-CM

## 2013-03-06 DIAGNOSIS — R109 Unspecified abdominal pain: Secondary | ICD-10-CM

## 2013-03-06 DIAGNOSIS — G4700 Insomnia, unspecified: Secondary | ICD-10-CM

## 2013-03-06 LAB — POCT URINALYSIS DIPSTICK
Bilirubin, UA: NEGATIVE
Blood, UA: NEGATIVE
Ketones, UA: NEGATIVE
Spec Grav, UA: 1.01
Urobilinogen, UA: 0.2

## 2013-03-06 MED ORDER — GLUCOSE BLOOD VI STRP: ORAL_STRIP | Status: DC

## 2013-03-06 MED ORDER — HYOSCYAMINE SULFATE 0.125 MG SL SUBL: 0.1250 mg | SUBLINGUAL_TABLET | Freq: Four times a day (QID) | SUBLINGUAL | Status: DC | PRN

## 2013-03-06 MED ORDER — ZOLPIDEM TARTRATE 5 MG PO TABS: 5.0000 mg | ORAL_TABLET | Freq: Every evening | ORAL | Status: DC | PRN

## 2013-03-06 MED ORDER — INSULIN PEN NEEDLE 31G X 8 MM MISC: Status: DC

## 2013-03-06 MED ORDER — HYOSCYAMINE SULFATE 0.125 MG PO TABS: 0.1250 mg | ORAL_TABLET | Freq: Three times a day (TID) | ORAL | Status: DC | PRN

## 2013-03-06 NOTE — Assessment & Plan Note (Signed)
Stable.  Continue same medication regimen.  BP: 130/70 mmHg

## 2013-03-06 NOTE — Progress Notes (Signed)
Subjective:    Patient ID: [name] [name], male    DOB: 1946/11/21, 67 y.o.   MRN: [id number]  HPI  67 year old white male with type 2 diabetes and coronary artery disease complains of intermittent lower abdominal pain for last 2 or 3 months.  Sometimes abdominal discomfort is worse after meals. His symptoms also seem to be worse in the evenings. Patient reports bout of gastroenteritis since previous office visit. His entire family had similar gastrointestinal illness. Ever since then,  patient reports loose stools daily. He denies any fever. He also denies dysuria.  Patient also complains of insomnia. No specific trigger.  Review of Systems Negative for chest pain or shortness of breath. no significant change in appetite or weight    Past Medical History  Diagnosis Date  . Diabetes mellitus type II, uncontrolled   . OSA (obstructive sleep apnea)   . Hyperlipidemia   . RBBB (right bundle branch block)   . Depression     Bipolar  . Edema   . Hypertension   . Falling episodes     these have occurred in the past and again recurring 2011  . Spondylosis     C5-6, C6-7 MRI 2010  . CAD (coronary artery disease)     A. CABG in 2000,status post cardiac cath in 2006, 2009 ....continued chest pain and SOB despite oral medication adjestments including Ranexa. B. Cath November 2009/ DES  Stent...11/26/2008 to distal  RCA leading to acute marginal.  C. Cath 07/2012 for CP - stable anatomy, med rx.  Marland Kitchen Hx of CABG     2000,  / one median sternotomy suture broken her chest x-ray November, 2010, no clinical significance  . Palpitations     event recorder showed sinus rhythm  . Nephrolithiasis   . Restless leg   . Dizziness   . Shortness of breath     CPX April, 2011, mild functional limitation, no clear pulmonary or cardiac limitation, possible deconditioning and mild chronotropic incompetence( peak heart rate 130)  . Cerebral ischemia     MRI November, 2010, chronic microvascular ischemia  .  Anemia     hemoglobin 7.4, iron deficiency, January, 2011, 2 unit transfusion, endoscopy normal, capsule endoscopy February, 2011 no small bowel abnormalities.   Most likely source gastric erosions, followed by GI  . H/O medication noncompliance     Due to loss of insurance  . Ejection fraction     EF 60%, echo, July 31, 2012    History   Social History  . Marital Status: Married    Spouse Name: N/A    Number of Children: N/A  . Years of Education: N/A   Occupational History  . Not on file.   Social History Main Topics  . Smoking status: Never Smoker   . Smokeless tobacco: Never Used  . Alcohol Use: No  . Drug Use: No  . Sexually Active: Not on file   Other Topics Concern  . Not on file   Social History Narrative  . No narrative on file    Past Surgical History  Procedure Laterality Date  . Nasal septum surgery      UP3  . Coronary artery bypass graft      2000    Family History  Problem Relation Age of Onset  . Cancer Brother     pancreatic cancer  . Diabetes Brother     4   . Stroke Brother   . Diabetes Brother   . Cancer Brother   .  Diabetes Brother   . Coronary artery disease Brother   . Diabetes Brother   . Hypothyroidism Brother   . Heart attack      Nephew    Allergies  Allergen Reactions  . Morphine     REACTION: hallucinations    Current Outpatient Prescriptions on File Prior to Visit  Medication Sig Dispense Refill  . aspirin EC 81 MG tablet Take 81 mg by mouth at bedtime.       . clotrimazole-betamethasone (LOTRISONE) cream Apply topically 2 (two) times daily.  30 g  2  . diclofenac sodium (VOLTAREN) 1 % GEL Apply 2 g topically 3 (three) times daily as needed. For pain to affected area  2 Tube  3  . fish oil-omega-3 fatty acids 1000 MG capsule Take 2 g by mouth 2 (two) times daily.      . furosemide (LASIX) 20 MG tablet Take 1 tablet (20 mg total) by mouth daily.  90 tablet  1  . gabapentin (NEURONTIN) 300 MG capsule Take 300 mg by  mouth 3 (three) times daily.      . insulin aspart (NOVOLOG FLEXPEN) 100 UNIT/ML injection Use 5 to 15 unit 3 times daily before meals  15 mL  5  . insulin glargine (LANTUS SOLOSTAR) 100 UNIT/ML injection Inject 40 Units into the skin at bedtime.  15 mL  5  . isosorbide mononitrate (IMDUR) 120 MG 24 hr tablet Take 1 tablet (120 mg total) by mouth daily.  30 tablet  6  . levothyroxine (SYNTHROID, LEVOTHROID) 75 MCG tablet Take 1 tablet (75 mcg total) by mouth daily.  90 tablet  1  . nebivolol (BYSTOLIC) 10 MG tablet Take 1 tablet (10 mg total) by mouth daily.  30 tablet  6  . nitroGLYCERIN (NITROSTAT) 0.4 MG SL tablet Place 1 tablet (0.4 mg total) under the tongue every 5 (five) minutes as needed (up to 3 doses).  25 tablet  4  . pantoprazole (PROTONIX) 40 MG tablet Take 1 tablet (40 mg total) by mouth daily.  90 tablet  1  . pramipexole (MIRAPEX) 0.25 MG tablet Take 1 tablet (0.25 mg total) by mouth at bedtime.  90 tablet  1  . sertraline (ZOLOFT) 100 MG tablet Take 1 tablet (100 mg total) by mouth 2 (two) times daily.  180 tablet  1  . simvastatin (ZOCOR) 40 MG tablet Take 1 tablet (40 mg total) by mouth every evening.  90 tablet  1   No current facility-administered medications on file prior to visit.    BP 130/70  Temp(Src) 98.3 F (36.8 C) (Oral)  Wt 184 lb (83.462 kg)  BMI 27.16 kg/m2    Objective:   Physical Exam  Constitutional: He is oriented to person, place, and time. He appears well-developed and well-nourished.  HENT:  Head: Normocephalic and atraumatic.  Cardiovascular: Normal rate, regular rhythm and normal heart sounds.   No murmur heard. Pulmonary/Chest: Effort normal and breath sounds normal. He has no wheezes.  Abdominal: Soft. Bowel sounds are normal. He exhibits no mass. There is no guarding.  Mild epigastric tenderness  Musculoskeletal: He exhibits no edema.  Neurological: He is alert and oriented to person, place, and time. No cranial nerve deficit.  Skin:  Skin is warm and dry.  Psychiatric: He has a normal mood and affect. His behavior is normal.          Assessment & Plan:

## 2013-03-06 NOTE — Assessment & Plan Note (Addendum)
Monitor A1c.  Trial of lower dose of metformin considering issues with diarrhea

## 2013-03-06 NOTE — Assessment & Plan Note (Signed)
67 year old white male experiencing intermittent abdominal pain and loose stools. Patient and his family had bout of gastroenteritis within the recent past. He may have postinfectious IBS. Trial of antispasmodics (Levsin 0.125 mg every 8 hours as needed).  Patient also advised to use probiotic supplement and  Metamucil daily. Reassess in 2 weeks. Check CBCD, BMET, LFTs, lipase

## 2013-03-06 NOTE — Assessment & Plan Note (Signed)
Patient experiencing chronic insomnia.  Trial of zolpidem 5 mg qhs prn.

## 2013-03-07 LAB — BASIC METABOLIC PANEL
BUN: 9 mg/dL (ref 6–23)
CO2: 27 mEq/L (ref 19–32)
Chloride: 102 mEq/L (ref 96–112)
Glucose, Bld: 154 mg/dL — ABNORMAL HIGH (ref 70–99)
Potassium: 3.9 mEq/L (ref 3.5–5.1)

## 2013-03-07 LAB — HEPATIC FUNCTION PANEL
ALT: 27 U/L (ref 0–53)
Alkaline Phosphatase: 62 U/L (ref 39–117)
Bilirubin, Direct: 0.1 mg/dL (ref 0.0–0.3)
Total Protein: 7.4 g/dL (ref 6.0–8.3)

## 2013-03-07 LAB — CBC WITH DIFFERENTIAL/PLATELET
Basophils Absolute: 0.1 10*3/uL (ref 0.0–0.1)
HCT: 40.7 % (ref 39.0–52.0)
Lymphs Abs: 2.4 10*3/uL (ref 0.7–4.0)
MCHC: 34.1 g/dL (ref 30.0–36.0)
MCV: 90.9 fl (ref 78.0–100.0)
Monocytes Absolute: 0.5 10*3/uL (ref 0.1–1.0)
Neutro Abs: 5.4 10*3/uL (ref 1.4–7.7)
Platelets: 177 10*3/uL (ref 150.0–400.0)
RDW: 12.9 % (ref 11.5–14.6)

## 2013-03-20 ENCOUNTER — Ambulatory Visit: Payer: Medicaid Other | Admitting: Internal Medicine

## 2013-03-30 ENCOUNTER — Ambulatory Visit: Payer: Medicaid Other | Admitting: Family Medicine

## 2013-05-01 DIAGNOSIS — N138 Other obstructive and reflux uropathy: Secondary | ICD-10-CM | POA: Insufficient documentation

## 2013-05-01 DIAGNOSIS — I2510 Atherosclerotic heart disease of native coronary artery without angina pectoris: Secondary | ICD-10-CM | POA: Insufficient documentation

## 2013-05-01 DIAGNOSIS — E119 Type 2 diabetes mellitus without complications: Secondary | ICD-10-CM | POA: Insufficient documentation

## 2013-05-07 ENCOUNTER — Other Ambulatory Visit: Payer: Self-pay | Admitting: *Deleted

## 2013-05-07 ENCOUNTER — Telehealth: Payer: Self-pay | Admitting: Internal Medicine

## 2013-05-07 MED ORDER — FUROSEMIDE 20 MG PO TABS: 20.0000 mg | ORAL_TABLET | Freq: Every day | ORAL | Status: DC

## 2013-05-07 MED ORDER — SERTRALINE HCL 100 MG PO TABS: 100.0000 mg | ORAL_TABLET | Freq: Two times a day (BID) | ORAL | Status: DC

## 2013-05-07 MED ORDER — LEVOTHYROXINE SODIUM 75 MCG PO TABS: 75.0000 ug | ORAL_TABLET | Freq: Every day | ORAL | Status: DC

## 2013-05-07 NOTE — Telephone Encounter (Signed)
Pt needs refill of:  (90 day supply each) gabapentin (NEURONTIN) 300 MG capsule isosorbide mononitrate (IMDUR) 120 MG 24 hr tablet metFORMIN (GLUCOPHAGE) 1000 MG tablet nebivolol (BYSTOLIC) 10 MG tablet pramipexole (MIRAPEX) 0.25 MG tablet nitroGLYCERIN (NITROSTAT) 0.4 MG SL tablet pantoprazole (PROTONIX) 40 MG tablet pantoprazole (PROTONIX) 40 MG tablet [facility]/ [address]  (Pt had called pharmacy, last week, we had no record so I took refill request.)

## 2013-05-08 MED ORDER — NITROGLYCERIN 0.4 MG SL SUBL: 0.4000 mg | SUBLINGUAL_TABLET | SUBLINGUAL | Status: DC | PRN

## 2013-05-08 MED ORDER — PRAMIPEXOLE DIHYDROCHLORIDE 0.25 MG PO TABS: 0.2500 mg | ORAL_TABLET | Freq: Every day | ORAL | Status: DC

## 2013-05-08 MED ORDER — METFORMIN HCL 1000 MG PO TABS: 500.0000 mg | ORAL_TABLET | Freq: Two times a day (BID) | ORAL | Status: DC

## 2013-05-08 MED ORDER — NEBIVOLOL HCL 10 MG PO TABS: 10.0000 mg | ORAL_TABLET | Freq: Every day | ORAL | Status: DC

## 2013-05-08 MED ORDER — PANTOPRAZOLE SODIUM 40 MG PO TBEC: 40.0000 mg | DELAYED_RELEASE_TABLET | Freq: Every day | ORAL | Status: DC

## 2013-05-08 MED ORDER — ISOSORBIDE MONONITRATE ER 120 MG PO TB24: 120.0000 mg | ORAL_TABLET | Freq: Every day | ORAL | Status: DC

## 2013-05-08 NOTE — Telephone Encounter (Signed)
rx's sent in electronically 

## 2013-05-11 ENCOUNTER — Other Ambulatory Visit: Payer: Self-pay | Admitting: *Deleted

## 2013-05-11 MED ORDER — SIMVASTATIN 40 MG PO TABS: 40.0000 mg | ORAL_TABLET | Freq: Every evening | ORAL | Status: DC

## 2013-06-05 DIAGNOSIS — E034 Atrophy of thyroid (acquired): Secondary | ICD-10-CM | POA: Insufficient documentation

## 2013-06-05 DIAGNOSIS — F329 Major depressive disorder, single episode, unspecified: Secondary | ICD-10-CM | POA: Insufficient documentation

## 2013-06-05 DIAGNOSIS — M19049 Primary osteoarthritis, unspecified hand: Secondary | ICD-10-CM | POA: Insufficient documentation

## 2013-06-05 DIAGNOSIS — I10 Essential (primary) hypertension: Secondary | ICD-10-CM

## 2013-06-05 DIAGNOSIS — G2581 Restless legs syndrome: Secondary | ICD-10-CM | POA: Insufficient documentation

## 2013-06-05 DIAGNOSIS — K219 Gastro-esophageal reflux disease without esophagitis: Secondary | ICD-10-CM | POA: Insufficient documentation

## 2013-06-05 DIAGNOSIS — F32A Depression, unspecified: Secondary | ICD-10-CM | POA: Insufficient documentation

## 2013-06-05 HISTORY — DX: Atrophy of thyroid (acquired): E03.4

## 2013-06-05 HISTORY — DX: Essential (primary) hypertension: I10

## 2013-06-14 ENCOUNTER — Other Ambulatory Visit: Payer: Self-pay

## 2013-06-25 ENCOUNTER — Other Ambulatory Visit: Payer: Self-pay | Admitting: *Deleted

## 2013-06-25 MED ORDER — INSULIN GLARGINE 100 UNIT/ML ~~LOC~~ SOLN: 40.0000 [IU] | Freq: Every day | SUBCUTANEOUS | Status: DC

## 2013-06-27 DIAGNOSIS — E785 Hyperlipidemia, unspecified: Secondary | ICD-10-CM | POA: Insufficient documentation

## 2013-09-10 ENCOUNTER — Other Ambulatory Visit: Payer: Self-pay | Admitting: *Deleted

## 2013-09-10 MED ORDER — GLUCOSE BLOOD VI STRP: ORAL_STRIP | Status: DC

## 2013-09-10 NOTE — Addendum Note (Signed)
Addended by: Townsend Roger D on: 09/10/2013 02:43 PM   Modules accepted: Orders

## 2013-09-11 ENCOUNTER — Telehealth: Payer: Self-pay | Admitting: Internal Medicine

## 2013-09-11 ENCOUNTER — Other Ambulatory Visit: Payer: Self-pay | Admitting: *Deleted

## 2013-09-11 MED ORDER — INSULIN PEN NEEDLE 31G X 8 MM MISC: 1.0000 | Freq: Three times a day (TID) | Status: DC

## 2013-09-11 MED ORDER — INSULIN PEN NEEDLE 31G X 8 MM MISC: 1.0000 | Freq: Four times a day (QID) | Status: DC

## 2013-09-11 NOTE — Telephone Encounter (Signed)
Pharm called concerning pt test strips. They did not need pin needles. Pt needs test strips. RX needs to state " pt test 4 times a day." pharm states the rx that came in stated "pt test 3-4 x/day".  Medicare requires rx be specific when stating instructions. pls resend new rx to [facility] [address]

## 2013-09-11 NOTE — Telephone Encounter (Signed)
rx sent in electronically 

## 2013-11-22 ENCOUNTER — Other Ambulatory Visit: Payer: Self-pay | Admitting: Internal Medicine

## 2013-11-23 ENCOUNTER — Other Ambulatory Visit: Payer: Self-pay | Admitting: Internal Medicine

## 2013-12-20 DIAGNOSIS — Z9289 Personal history of other medical treatment: Secondary | ICD-10-CM | POA: Insufficient documentation

## 2013-12-20 DIAGNOSIS — Z7983 Long term (current) use of bisphosphonates: Secondary | ICD-10-CM | POA: Insufficient documentation

## 2013-12-20 HISTORY — DX: Personal history of other medical treatment: Z92.89

## 2014-01-04 ENCOUNTER — Encounter: Payer: Self-pay | Admitting: Gastroenterology

## 2014-03-25 ENCOUNTER — Other Ambulatory Visit: Payer: Self-pay | Admitting: Internal Medicine

## 2014-03-26 ENCOUNTER — Other Ambulatory Visit: Payer: Self-pay | Admitting: Internal Medicine

## 2014-04-24 LAB — HM HEPATITIS C SCREENING LAB: HM Hepatitis Screen: NEGATIVE

## 2014-04-26 ENCOUNTER — Encounter: Payer: Self-pay | Admitting: Pulmonary Disease

## 2014-05-10 DIAGNOSIS — M25569 Pain in unspecified knee: Secondary | ICD-10-CM | POA: Insufficient documentation

## 2014-05-10 DIAGNOSIS — M25561 Pain in right knee: Secondary | ICD-10-CM | POA: Insufficient documentation

## 2014-06-02 ENCOUNTER — Other Ambulatory Visit: Payer: Self-pay | Admitting: Internal Medicine

## 2014-10-30 ENCOUNTER — Encounter (HOSPITAL_BASED_OUTPATIENT_CLINIC_OR_DEPARTMENT_OTHER): Payer: Self-pay

## 2014-10-30 ENCOUNTER — Emergency Department (HOSPITAL_BASED_OUTPATIENT_CLINIC_OR_DEPARTMENT_OTHER): Payer: Medicare PPO

## 2014-10-30 ENCOUNTER — Emergency Department (HOSPITAL_BASED_OUTPATIENT_CLINIC_OR_DEPARTMENT_OTHER)
Admission: EM | Admit: 2014-10-30 | Discharge: 2014-10-30 | Disposition: A | Discharge status: Home / Self Care | Payer: Medicare PPO | Attending: Emergency Medicine | Admitting: Emergency Medicine

## 2014-10-30 DIAGNOSIS — Z79899 Other long term (current) drug therapy: Secondary | ICD-10-CM | POA: Diagnosis not present

## 2014-10-30 DIAGNOSIS — Z7982 Long term (current) use of aspirin: Secondary | ICD-10-CM | POA: Insufficient documentation

## 2014-10-30 DIAGNOSIS — Z794 Long term (current) use of insulin: Secondary | ICD-10-CM | POA: Diagnosis not present

## 2014-10-30 DIAGNOSIS — S299XXA Unspecified injury of thorax, initial encounter: Secondary | ICD-10-CM | POA: Diagnosis present

## 2014-10-30 DIAGNOSIS — S20211A Contusion of right front wall of thorax, initial encounter: Secondary | ICD-10-CM | POA: Diagnosis not present

## 2014-10-30 DIAGNOSIS — I2510 Atherosclerotic heart disease of native coronary artery without angina pectoris: Secondary | ICD-10-CM | POA: Insufficient documentation

## 2014-10-30 DIAGNOSIS — Z951 Presence of aortocoronary bypass graft: Secondary | ICD-10-CM | POA: Diagnosis not present

## 2014-10-30 DIAGNOSIS — Y9389 Activity, other specified: Secondary | ICD-10-CM | POA: Insufficient documentation

## 2014-10-30 DIAGNOSIS — W19XXXA Unspecified fall, initial encounter: Secondary | ICD-10-CM

## 2014-10-30 DIAGNOSIS — I10 Essential (primary) hypertension: Secondary | ICD-10-CM | POA: Diagnosis not present

## 2014-10-30 DIAGNOSIS — E785 Hyperlipidemia, unspecified: Secondary | ICD-10-CM | POA: Diagnosis not present

## 2014-10-30 DIAGNOSIS — Z862 Personal history of diseases of the blood and blood-forming organs and certain disorders involving the immune mechanism: Secondary | ICD-10-CM | POA: Diagnosis not present

## 2014-10-30 DIAGNOSIS — E119 Type 2 diabetes mellitus without complications: Secondary | ICD-10-CM | POA: Diagnosis not present

## 2014-10-30 DIAGNOSIS — Z9119 Patient's noncompliance with other medical treatment and regimen: Secondary | ICD-10-CM | POA: Insufficient documentation

## 2014-10-30 DIAGNOSIS — Z9181 History of falling: Secondary | ICD-10-CM | POA: Diagnosis not present

## 2014-10-30 DIAGNOSIS — W108XXA Fall (on) (from) other stairs and steps, initial encounter: Secondary | ICD-10-CM | POA: Insufficient documentation

## 2014-10-30 DIAGNOSIS — Z87442 Personal history of urinary calculi: Secondary | ICD-10-CM | POA: Diagnosis not present

## 2014-10-30 DIAGNOSIS — Y9289 Other specified places as the place of occurrence of the external cause: Secondary | ICD-10-CM | POA: Insufficient documentation

## 2014-10-30 DIAGNOSIS — F329 Major depressive disorder, single episode, unspecified: Secondary | ICD-10-CM | POA: Diagnosis not present

## 2014-10-30 DIAGNOSIS — Y998 Other external cause status: Secondary | ICD-10-CM | POA: Diagnosis not present

## 2014-10-30 MED ORDER — HYDROCODONE-ACETAMINOPHEN 5-325 MG PO TABS: 2.0000 | ORAL_TABLET | ORAL | Status: DC | PRN

## 2014-10-30 NOTE — ED Provider Notes (Signed)
CSN: [id number]     Arrival date & time 10/30/14  1805 History   First MD Initiated Contact with Patient 10/30/14 1846     Chief Complaint  Patient presents with  . Fall     (Consider location/radiation/quality/duration/timing/severity/associated sxs/prior Treatment) Patient is a 67 y.o. male presenting with fall. The history is provided by the patient. No language interpreter was used.  Fall This is a new problem. Episode onset: 6 days. The problem occurs constantly. The problem has been gradually worsening. Associated symptoms include chest pain. Pertinent negatives include no congestion or coughing. Nothing aggravates the symptoms. He has tried nothing for the symptoms. The treatment provided moderate relief.  Pt fell on stairs and hit right ribs.  Pt complains of pain.  Pt reports pain is worse at night.  Pt reports some pain with he moves at night.  Pt thinks he may have a broken rib. No impact of head. No loc.  Past Medical History  Diagnosis Date  . Diabetes mellitus type II, uncontrolled   . OSA (obstructive sleep apnea)   . Hyperlipidemia   . RBBB (right bundle branch block)   . Depression     Bipolar  . Edema   . Hypertension   . Falling episodes     these have occurred in the past and again recurring 2011  . Spondylosis     C5-6, C6-7 MRI 2010  . CAD (coronary artery disease)     A. CABG in 2000,status post cardiac cath in 2006, 2009 ....continued chest pain and SOB despite oral medication adjestments including Ranexa. B. Cath November 2009/ DES  Stent...11/26/2008 to distal  RCA leading to acute marginal.  C. Cath 07/2012 for CP - stable anatomy, med rx.  Marland Kitchen Hx of CABG     2000,  / one median sternotomy suture broken her chest x-ray November, 2010, no clinical significance  . Palpitations     event recorder showed sinus rhythm  . Nephrolithiasis   . Restless leg   . Dizziness   . Shortness of breath     CPX April, 2011, mild functional limitation, no clear pulmonary  or cardiac limitation, possible deconditioning and mild chronotropic incompetence( peak heart rate 130)  . Cerebral ischemia     MRI November, 2010, chronic microvascular ischemia  . Anemia     hemoglobin 7.4, iron deficiency, January, 2011, 2 unit transfusion, endoscopy normal, capsule endoscopy February, 2011 no small bowel abnormalities.   Most likely source gastric erosions, followed by GI  . H/O medication noncompliance     Due to loss of insurance  . Ejection fraction     EF 60%, echo, July 31, 2012   Past Surgical History  Procedure Laterality Date  . Nasal septum surgery      UP3  . Coronary artery bypass graft      2000   Family History  Problem Relation Age of Onset  . Cancer Brother     pancreatic cancer  . Diabetes Brother     4   . Stroke Brother   . Diabetes Brother   . Cancer Brother   . Diabetes Brother   . Coronary artery disease Brother   . Diabetes Brother   . Hypothyroidism Brother   . Heart attack      Nephew   History  Substance Use Topics  . Smoking status: Never Smoker   . Smokeless tobacco: Never Used  . Alcohol Use: No    Review of Systems  HENT:  Negative for congestion.   Respiratory: Negative for cough.   Cardiovascular: Positive for chest pain.  All other systems reviewed and are negative.     Allergies  Morphine  Home Medications   Prior to Admission medications   Medication Sig Start Date End Date Taking? Authorizing Provider  aspirin EC 81 MG tablet Take 81 mg by mouth at bedtime.     Historical Provider, MD  BYSTOLIC 10 MG tablet TAKE 1 TABLET BY MOUTH EVERY DAY    Doe-Hyun R Yoo, DO  clotrimazole-betamethasone (LOTRISONE) cream APPLY TO THE AFFECTED TWICE DAILY 11/23/13   Lisabeth Pick, MD  diclofenac sodium (VOLTAREN) 1 % GEL Apply 2 g topically 3 (three) times daily as needed. For pain to affected area 10/24/12   Doe-Hyun R Shawna Orleans, DO  fish oil-omega-3 fatty acids 1000 MG capsule Take 2 g by mouth 2 (two) times daily.     Historical Provider, MD  furosemide (LASIX) 20 MG tablet Take 1 tablet (20 mg total) by mouth daily. 05/07/13   Doe-Hyun R Shawna Orleans, DO  gabapentin (NEURONTIN) 300 MG capsule Take 300 mg by mouth 3 (three) times daily.    Historical Provider, MD  glucose blood (BAYER CONTOUR NEXT TEST) test strip Use 3-4 times per day as instructed 09/10/13   Doe-Hyun R Shawna Orleans, DO  hyoscyamine (LEVSIN, ANASPAZ) 0.125 MG tablet Take 1 tablet (0.125 mg total) by mouth every 8 (eight) hours as needed for cramping. 03/06/13   Doe-Hyun R Shawna Orleans, DO  insulin aspart (NOVOLOG FLEXPEN) 100 UNIT/ML injection Use 5 to 15 unit 3 times daily before meals 10/24/12   Doe-Hyun R Shawna Orleans, DO  Insulin Pen Needle (COMFORT EZ PEN NEEDLES) 31G X 8 MM MISC 1 each by Other route 4 (four) times daily. 09/11/13   Doe-Hyun R Shawna Orleans, DO  isosorbide mononitrate (IMDUR) 120 MG 24 hr tablet Take 1 tablet (120 mg total) by mouth daily. 05/08/13   Doe-Hyun R Shawna Orleans, DO  LANTUS SOLOSTAR 100 UNIT/ML Solostar Pen INJECT 40 UNITS INTO SKIN AT BEDTIME 03/25/14   Doe-Hyun R Shawna Orleans, DO  levothyroxine (SYNTHROID, LEVOTHROID) 75 MCG tablet Take 1 tablet (75 mcg total) by mouth daily. 05/07/13   Doe-Hyun R Shawna Orleans, DO  metFORMIN (GLUCOPHAGE) 1000 MG tablet Take 0.5 tablets (500 mg total) by mouth 2 (two) times daily with a meal. 05/08/13   Doe-Hyun R Shawna Orleans, DO  nitroGLYCERIN (NITROSTAT) 0.4 MG SL tablet Place 1 tablet (0.4 mg total) under the tongue every 5 (five) minutes as needed (up to 3 doses). 05/08/13   Doe-Hyun R Shawna Orleans, DO  pantoprazole (PROTONIX) 40 MG tablet Take 1 tablet (40 mg total) by mouth daily. 05/08/13   Doe-Hyun R Shawna Orleans, DO  pramipexole (MIRAPEX) 0.25 MG tablet Take 1 tablet (0.25 mg total) by mouth at bedtime. 05/08/13   Doe-Hyun R Shawna Orleans, DO  sertraline (ZOLOFT) 100 MG tablet Take 1 tablet (100 mg total) by mouth 2 (two) times daily. 05/07/13   Doe-Hyun R Shawna Orleans, DO  simvastatin (ZOCOR) 40 MG tablet Take 1 tablet (40 mg total) by mouth every evening. 05/11/13   Doe-Hyun R Shawna Orleans, DO  zolpidem (AMBIEN) 5  MG tablet Take 1 tablet (5 mg total) by mouth at bedtime as needed for sleep. 03/06/13   Doe-Hyun R Shawna Orleans, DO   BP 140/61 mmHg  Pulse 58  Temp(Src) 98 F (36.7 C) (Oral)  Resp 16  Ht 5\' 9"  (1.753 m)  Wt 185 lb (83.915 kg)  BMI 27.31 kg/m2  SpO2 100% Physical Exam  Constitutional: He appears well-developed and well-nourished.  HENT:  Head: Normocephalic.  Eyes: Conjunctivae and EOM are normal. Pupils are equal, round, and reactive to light.  Neck: Normal range of motion.  Pulmonary/Chest: Effort normal. He exhibits tenderness.  Tender lateral right ribs  Abdominal: Soft.  Musculoskeletal: Normal range of motion.  Neurological: He is alert.  Skin: Skin is warm.  Nursing note and vitals reviewed.   ED Course  Procedures (including critical care time) Labs Review Labs Reviewed - No data to display  Imaging Review Dg Ribs Unilateral W/chest Right  10/30/2014   CLINICAL DATA:  Fall 5 days previously with pain  EXAM: RIGHT RIBS AND CHEST - 3+ VIEW  COMPARISON:  Chest radiograph July 30, 2012  FINDINGS: Frontal chest as well as oblique and cone-down lower rib images were obtained. Lungs are clear. Heart size and pulmonary vascularity are normal. Patient is status post coronary artery bypass grafting. No adenopathy.  No pneumothorax or effusion.  There is no demonstrable rib fracture.  IMPRESSION: No edema or consolidation.  No demonstrable rib fracture.   Electronically Signed   By: Lowella Grip [name]   On: 10/30/2014 19:32     EKG Interpretation None      MDM   Pt counseled on xray.   Pt given hydrocodone for food   Final diagnoses:  [place], PA-C 10/30/14 [place], PA-C 10/30/14 [place], MD 10/30/14 2325

## 2014-10-30 NOTE — Discharge Instructions (Signed)
Chest Contusion °A chest contusion is a deep bruise on your chest area. Contusions are the result of an injury that caused bleeding under the skin. A chest contusion may involve bruising of the skin, muscles, or ribs. The contusion may turn blue, purple, or yellow. Minor injuries will give you a painless contusion, but more severe contusions may stay painful and swollen for a few weeks. °CAUSES  °A contusion is usually caused by a blow, trauma, or direct force to an area of the body. °SYMPTOMS  °· Swelling and redness of the injured area. °· Discoloration of the injured area. °· Tenderness and soreness of the injured area. °· Pain. °DIAGNOSIS  °The diagnosis can be made by taking a history and performing a physical exam. An X-ray, CT scan, or MRI may be needed to determine if there were any associated injuries, such as broken bones (fractures) or internal injuries. °TREATMENT  °Often, the best treatment for a chest contusion is resting, icing, and applying cold compresses to the injured area. Deep breathing exercises may be recommended to reduce the risk of pneumonia. Over-the-counter medicines may also be recommended for pain control. °HOME CARE INSTRUCTIONS  °· Put ice on the injured area. °¨ Put ice in a plastic bag. °¨ Place a towel between your skin and the bag. °¨ Leave the ice on for 15-20 minutes, 03-04 times a day. °· Only take over-the-counter or prescription medicines as directed by your caregiver. Your caregiver may recommend avoiding anti-inflammatory medicines (aspirin, ibuprofen, and naproxen) for 48 hours because these medicines may increase bruising. °· Rest the injured area. °· Perform deep-breathing exercises as directed by your caregiver. °· Stop smoking if you smoke. °· Do not lift objects over 5 pounds (2.3 kg) for 3 days or longer if recommended by your caregiver. °SEEK IMMEDIATE MEDICAL CARE IF:  °· You have increased bruising or swelling. °· You have pain that is getting worse. °· You have  difficulty breathing. °· You have dizziness, weakness, or fainting. °· You have blood in your urine or stool. °· You cough up or vomit blood. °· Your swelling or pain is not relieved with medicines. °MAKE SURE YOU:  °· Understand these instructions. °· Will watch your condition. °· Will get help right away if you are not doing well or get worse. °Document Released: 08/17/2001 Document Revised: 08/16/2012 Document Reviewed: 05/15/2012 °[employer]® Patient Information ©2015 [employer]. This information is not intended to replace advice given to you by your health care provider. Make sure you discuss any questions you have with your health care provider. ° °

## 2014-10-30 NOTE — ED Notes (Signed)
Fell down steps 5 days ago-pain to right rib and bruise to mid chest

## 2014-11-09 ENCOUNTER — Encounter (HOSPITAL_BASED_OUTPATIENT_CLINIC_OR_DEPARTMENT_OTHER): Payer: Self-pay | Admitting: *Deleted

## 2014-11-09 ENCOUNTER — Emergency Department (HOSPITAL_BASED_OUTPATIENT_CLINIC_OR_DEPARTMENT_OTHER)
Admission: EM | Admit: 2014-11-09 | Discharge: 2014-11-09 | Disposition: A | Discharge status: Home / Self Care | Payer: Medicare PPO | Attending: Emergency Medicine | Admitting: Emergency Medicine

## 2014-11-09 ENCOUNTER — Emergency Department (HOSPITAL_BASED_OUTPATIENT_CLINIC_OR_DEPARTMENT_OTHER): Payer: Medicare PPO

## 2014-11-09 DIAGNOSIS — F329 Major depressive disorder, single episode, unspecified: Secondary | ICD-10-CM | POA: Diagnosis not present

## 2014-11-09 DIAGNOSIS — Z87442 Personal history of urinary calculi: Secondary | ICD-10-CM | POA: Insufficient documentation

## 2014-11-09 DIAGNOSIS — Z7982 Long term (current) use of aspirin: Secondary | ICD-10-CM | POA: Insufficient documentation

## 2014-11-09 DIAGNOSIS — Z8669 Personal history of other diseases of the nervous system and sense organs: Secondary | ICD-10-CM | POA: Insufficient documentation

## 2014-11-09 DIAGNOSIS — I10 Essential (primary) hypertension: Secondary | ICD-10-CM | POA: Insufficient documentation

## 2014-11-09 DIAGNOSIS — S99922A Unspecified injury of left foot, initial encounter: Secondary | ICD-10-CM | POA: Diagnosis present

## 2014-11-09 DIAGNOSIS — Y92009 Unspecified place in unspecified non-institutional (private) residence as the place of occurrence of the external cause: Secondary | ICD-10-CM | POA: Insufficient documentation

## 2014-11-09 DIAGNOSIS — Z9181 History of falling: Secondary | ICD-10-CM | POA: Insufficient documentation

## 2014-11-09 DIAGNOSIS — S9032XA Contusion of left foot, initial encounter: Secondary | ICD-10-CM | POA: Diagnosis not present

## 2014-11-09 DIAGNOSIS — Z79899 Other long term (current) drug therapy: Secondary | ICD-10-CM | POA: Diagnosis not present

## 2014-11-09 DIAGNOSIS — Y9389 Activity, other specified: Secondary | ICD-10-CM | POA: Diagnosis not present

## 2014-11-09 DIAGNOSIS — Y998 Other external cause status: Secondary | ICD-10-CM | POA: Diagnosis not present

## 2014-11-09 DIAGNOSIS — Z794 Long term (current) use of insulin: Secondary | ICD-10-CM | POA: Diagnosis not present

## 2014-11-09 DIAGNOSIS — Z791 Long term (current) use of non-steroidal anti-inflammatories (NSAID): Secondary | ICD-10-CM | POA: Diagnosis not present

## 2014-11-09 DIAGNOSIS — Z862 Personal history of diseases of the blood and blood-forming organs and certain disorders involving the immune mechanism: Secondary | ICD-10-CM | POA: Diagnosis not present

## 2014-11-09 DIAGNOSIS — Z951 Presence of aortocoronary bypass graft: Secondary | ICD-10-CM | POA: Diagnosis not present

## 2014-11-09 DIAGNOSIS — E785 Hyperlipidemia, unspecified: Secondary | ICD-10-CM | POA: Diagnosis not present

## 2014-11-09 DIAGNOSIS — I2510 Atherosclerotic heart disease of native coronary artery without angina pectoris: Secondary | ICD-10-CM | POA: Insufficient documentation

## 2014-11-09 DIAGNOSIS — E119 Type 2 diabetes mellitus without complications: Secondary | ICD-10-CM | POA: Insufficient documentation

## 2014-11-09 DIAGNOSIS — W1830XA Fall on same level, unspecified, initial encounter: Secondary | ICD-10-CM | POA: Diagnosis not present

## 2014-11-09 MED ORDER — ACETAMINOPHEN 500 MG PO TABS
1000.0000 mg | ORAL_TABLET | Freq: Once | ORAL | Status: AC
  Administered 2014-11-09: 1000 mg via ORAL
  Filled 2014-11-09: qty 2

## 2014-11-09 NOTE — ED Provider Notes (Addendum)
CSN: [id number]     Arrival date & time 11/09/14  1325 History   First MD Initiated Contact with Patient 11/09/14 1337     Chief Complaint  Patient presents with  . Foot Injury     (Consider location/radiation/quality/duration/timing/severity/associated sxs/prior Treatment) HPI  68 year old male presents after a fall and foot injury last night. He was walking his dogs back into the house and thinks he slipped on the wet steps while trying to go inside. He fell down multiple steps but did not think he had a significant injuries and stood up and walked back inside. When he woke up this morning his foot was significantly swollen and he had pain with movement and walking. The pain is mostly localized to his medial distal foot over his great toe. His wife states his ankle was swollen. He denies any ankle pain at this time. Rates the pain is moderate. Has not taken anything for the pain.  Past Medical History  Diagnosis Date  . Diabetes mellitus type II, uncontrolled   . OSA (obstructive sleep apnea)   . Hyperlipidemia   . RBBB (right bundle branch block)   . Depression     Bipolar  . Edema   . Hypertension   . Falling episodes     these have occurred in the past and again recurring 2011  . Spondylosis     C5-6, C6-7 MRI 2010  . CAD (coronary artery disease)     A. CABG in 2000,status post cardiac cath in 2006, 2009 ....continued chest pain and SOB despite oral medication adjestments including Ranexa. B. Cath November 2009/ DES  Stent...11/26/2008 to distal  RCA leading to acute marginal.  C. Cath 07/2012 for CP - stable anatomy, med rx.  Marland Kitchen Hx of CABG     2000,  / one median sternotomy suture broken her chest x-ray November, 2010, no clinical significance  . Palpitations     event recorder showed sinus rhythm  . Nephrolithiasis   . Restless leg   . Dizziness   . Shortness of breath     CPX April, 2011, mild functional limitation, no clear pulmonary or cardiac limitation, possible  deconditioning and mild chronotropic incompetence( peak heart rate 130)  . Cerebral ischemia     MRI November, 2010, chronic microvascular ischemia  . Anemia     hemoglobin 7.4, iron deficiency, January, 2011, 2 unit transfusion, endoscopy normal, capsule endoscopy February, 2011 no small bowel abnormalities.   Most likely source gastric erosions, followed by GI  . H/O medication noncompliance     Due to loss of insurance  . Ejection fraction     EF 60%, echo, July 31, 2012  . Diabetes mellitus without complication    Past Surgical History  Procedure Laterality Date  . Nasal septum surgery      UP3  . Coronary artery bypass graft      2000   Family History  Problem Relation Age of Onset  . Cancer Brother     pancreatic cancer  . Diabetes Brother     4   . Stroke Brother   . Diabetes Brother   . Cancer Brother   . Diabetes Brother   . Coronary artery disease Brother   . Diabetes Brother   . Hypothyroidism Brother   . Heart attack      Nephew   History  Substance Use Topics  . Smoking status: Never Smoker   . Smokeless tobacco: Never Used  . Alcohol Use: No  Review of Systems  Musculoskeletal: Positive for joint swelling and arthralgias.  Skin: Negative for wound.  Neurological: Negative for weakness and numbness.  All other systems reviewed and are negative.     Allergies  Morphine  Home Medications   Prior to Admission medications   Medication Sig Start Date End Date Taking? Authorizing Provider  aspirin EC 81 MG tablet Take 81 mg by mouth at bedtime.     Historical Provider, MD  BYSTOLIC 10 MG tablet TAKE 1 TABLET BY MOUTH EVERY DAY    Doe-Hyun R Yoo, DO  clotrimazole-betamethasone (LOTRISONE) cream APPLY TO THE AFFECTED TWICE DAILY 11/23/13   Lisabeth Pick, MD  diclofenac sodium (VOLTAREN) 1 % GEL Apply 2 g topically 3 (three) times daily as needed. For pain to affected area 10/24/12   Doe-Hyun R Shawna Orleans, DO  fish oil-omega-3 fatty acids 1000 MG capsule  Take 2 g by mouth 2 (two) times daily.    Historical Provider, MD  furosemide (LASIX) 20 MG tablet Take 1 tablet (20 mg total) by mouth daily. 05/07/13   Doe-Hyun R Shawna Orleans, DO  gabapentin (NEURONTIN) 300 MG capsule Take 300 mg by mouth 3 (three) times daily.    Historical Provider, MD  glucose blood (BAYER CONTOUR NEXT TEST) test strip Use 3-4 times per day as instructed 09/10/13   Doe-Hyun R Shawna Orleans, DO  HYDROcodone-acetaminophen (NORCO/VICODIN) 5-325 MG per tablet Take 2 tablets by mouth every 4 (four) hours as needed. 10/30/14   Fransico Meadow, PA-C  hyoscyamine (LEVSIN, ANASPAZ) 0.125 MG tablet Take 1 tablet (0.125 mg total) by mouth every 8 (eight) hours as needed for cramping. 03/06/13   Doe-Hyun R Shawna Orleans, DO  insulin aspart (NOVOLOG FLEXPEN) 100 UNIT/ML injection Use 5 to 15 unit 3 times daily before meals 10/24/12   Doe-Hyun R Shawna Orleans, DO  Insulin Pen Needle (COMFORT EZ PEN NEEDLES) 31G X 8 MM MISC 1 each by Other route 4 (four) times daily. 09/11/13   Doe-Hyun R Shawna Orleans, DO  isosorbide mononitrate (IMDUR) 120 MG 24 hr tablet Take 1 tablet (120 mg total) by mouth daily. 05/08/13   Doe-Hyun R Shawna Orleans, DO  LANTUS SOLOSTAR 100 UNIT/ML Solostar Pen INJECT 40 UNITS INTO SKIN AT BEDTIME 03/25/14   Doe-Hyun R Shawna Orleans, DO  levothyroxine (SYNTHROID, LEVOTHROID) 75 MCG tablet Take 1 tablet (75 mcg total) by mouth daily. 05/07/13   Doe-Hyun R Shawna Orleans, DO  metFORMIN (GLUCOPHAGE) 1000 MG tablet Take 0.5 tablets (500 mg total) by mouth 2 (two) times daily with a meal. 05/08/13   Doe-Hyun R Shawna Orleans, DO  nitroGLYCERIN (NITROSTAT) 0.4 MG SL tablet Place 1 tablet (0.4 mg total) under the tongue every 5 (five) minutes as needed (up to 3 doses). 05/08/13   Doe-Hyun R Shawna Orleans, DO  pantoprazole (PROTONIX) 40 MG tablet Take 1 tablet (40 mg total) by mouth daily. 05/08/13   Doe-Hyun R Shawna Orleans, DO  pramipexole (MIRAPEX) 0.25 MG tablet Take 1 tablet (0.25 mg total) by mouth at bedtime. 05/08/13   Doe-Hyun R Shawna Orleans, DO  sertraline (ZOLOFT) 100 MG tablet Take 1 tablet (100 mg total)  by mouth 2 (two) times daily. 05/07/13   Doe-Hyun R Shawna Orleans, DO  simvastatin (ZOCOR) 40 MG tablet Take 1 tablet (40 mg total) by mouth every evening. 05/11/13   Doe-Hyun R Shawna Orleans, DO  zolpidem (AMBIEN) 5 MG tablet Take 1 tablet (5 mg total) by mouth at bedtime as needed for sleep. 03/06/13   Doe-Hyun R Shawna Orleans, DO   BP 117/57 mmHg  Pulse  60  Temp(Src) 97.8 F (36.6 C) (Oral)  Resp 20  SpO2 99% Physical Exam  Constitutional: He is oriented to person, place, and time. He appears well-developed and well-nourished.  HENT:  Head: Normocephalic and atraumatic.  Right Ear: External ear normal.  Left Ear: External ear normal.  Nose: Nose normal.  Eyes: Right eye exhibits no discharge. Left eye exhibits no discharge.  Cardiovascular: Intact distal pulses.   Pulses:      Dorsalis pedis pulses are 2+ on the left side.  Pulmonary/Chest: Effort normal.  Musculoskeletal: He exhibits no edema.       Left ankle: He exhibits normal range of motion and no swelling. No tenderness.       Left foot: There is decreased range of motion, tenderness and bony tenderness. There is no deformity and no laceration.       Feet:  Neurological: He is alert and oriented to person, place, and time.  Skin: Skin is warm and dry.  Nursing note and vitals reviewed.   ED Course  Procedures (including critical care time) Labs Review Labs Reviewed - No data to display  Imaging Review Dg Foot Complete Left  11/09/2014   CLINICAL DATA:  Fall, acute foot injury, pain and swelling  EXAM: LEFT FOOT - COMPLETE 3+ VIEW  COMPARISON:  None.  FINDINGS: Normal alignment without acute displaced fracture. Preserved joint spaces. No significant arthropathy. No soft tissue abnormality or radiopaque foreign body. Small plantar calcaneal spur noted. Peripheral atherosclerosis present.  IMPRESSION: No acute osseous finding   Electronically Signed   By: Daryll Brod [name]   On: 11/09/2014 14:32     EKG Interpretation None      MDM   Final  diagnoses:  Foot contusion, left, initial encounter    X-ray negative for fracture. At this point will treat with ice, elevation, Tylenol, and postop shoe. Patient is able to ambulate but with pain. Is neurovascularly intact. Will discharge with symptomatic care and PCP follow-up as needed.    Ephraim Hamburger, MD 11/09/14 1514  Ephraim Hamburger, MD 11/09/14 [phone]

## 2014-11-09 NOTE — Discharge Instructions (Signed)
Foot Contusion A foot contusion is a deep bruise to the foot. Contusions are the result of an injury that caused bleeding under the skin. The contusion may turn blue, purple, or yellow. Minor injuries will give you a painless contusion, but more severe contusions may stay painful and swollen for a few weeks. CAUSES  A foot contusion comes from a direct blow to that area, such as a heavy object falling on the foot. SYMPTOMS   Swelling of the foot.  Discoloration of the foot.  Tenderness or soreness of the foot. DIAGNOSIS  You will have a physical exam and will be asked about your history. You may need an X-ray of your foot to look for a broken bone (fracture).  TREATMENT  An elastic wrap may be recommended to support your foot. Resting, elevating, and applying cold compresses to your foot are often the best treatments for a foot contusion. Over-the-counter medicines may also be recommended for pain control. HOME CARE INSTRUCTIONS   Put ice on the injured area.  Put ice in a plastic bag.  Place a towel between your skin and the bag.  Leave the ice on for 15-20 minutes, 03-04 times a day.  Only take over-the-counter or prescription medicines for pain, discomfort, or fever as directed by your caregiver.  If told, use an elastic wrap as directed. This can help reduce swelling. You may remove the wrap for sleeping, showering, and bathing. If your toes become numb, cold, or blue, take the wrap off and reapply it more loosely.  Elevate your foot with pillows to reduce swelling.  Try to avoid standing or walking while the foot is painful. Do not resume use until instructed by your caregiver. Then, begin use gradually. If pain develops, decrease use. Gradually increase activities that do not cause discomfort until you have normal use of your foot.  See your caregiver as directed. It is very important to keep all follow-up appointments in order to avoid any lasting problems with your foot,  including long-term (chronic) pain. SEEK IMMEDIATE MEDICAL CARE IF:   You have increased redness, swelling, or pain in your foot.  Your swelling or pain is not relieved with medicines.  You have loss of feeling in your foot or are unable to move your toes.  Your foot turns cold or blue.  You have pain when you move your toes.  Your foot becomes warm to the touch.  Your contusion does not improve in 2 days. MAKE SURE YOU:   Understand these instructions.  Will watch your condition.  Will get help right away if you are not doing well or get worse. Document Released: 09/13/2006 Document Revised: 05/23/2012 Document Reviewed: 10/26/2011 [facility] Patient Information 2015 [place], [place]. This information is not intended to replace advice given to you by your health care provider. Make sure you discuss any questions you have with your health care provider.   RICE: Routine Care for Injuries The routine care of many injuries includes Rest, Ice, Compression, and Elevation (RICE). HOME CARE INSTRUCTIONS  Rest is needed to allow your body to heal. Routine activities can usually be resumed when comfortable. Injured tendons and bones can take up to 6 weeks to heal. Tendons are the cord-like structures that attach muscle to bone.  Ice following an injury helps keep the swelling down and reduces pain.  Put ice in a plastic bag.  Place a towel between your skin and the bag.  Leave the ice on for 15-20 minutes, 3-4 times a  day, or as directed by your health care provider. Do this while awake, for the first 24 to 48 hours. After that, continue as directed by your caregiver.  Compression helps keep swelling down. It also gives support and helps with discomfort. If an elastic bandage has been applied, it should be removed and reapplied every 3 to 4 hours. It should not be applied tightly, but firmly enough to keep swelling down. Watch fingers or toes for swelling, bluish discoloration, coldness,  numbness, or excessive pain. If any of these problems occur, remove the bandage and reapply loosely. Contact your caregiver if these problems continue.  Elevation helps reduce swelling and decreases pain. With extremities, such as the arms, hands, legs, and feet, the injured area should be placed near or above the level of the heart, if possible. SEEK IMMEDIATE MEDICAL CARE IF:  You have persistent pain and swelling.  You develop redness, numbness, or unexpected weakness.  Your symptoms are getting worse rather than improving after several days. These symptoms may indicate that further evaluation or further X-rays are needed. Sometimes, X-rays may not show a small broken bone (fracture) until 1 week or 10 days later. Make a follow-up appointment with your caregiver. Ask when your X-ray results will be ready. Make sure you get your X-ray results. Document Released: 03/06/2001 Document Revised: 11/27/2013 Document Reviewed: 04/23/2011 [facility] Patient Information 2015 [place], [place]. This information is not intended to replace advice given to you by your health care provider. Make sure you discuss any questions you have with your health care provider.

## 2014-11-09 NOTE — ED Notes (Signed)
Reports he slipped on wet porch while walking dog last night- c/o pain in left foot and swelling in ankle

## 2014-11-14 ENCOUNTER — Encounter (HOSPITAL_COMMUNITY): Payer: Self-pay | Admitting: Cardiology

## 2014-11-22 ENCOUNTER — Ambulatory Visit (INDEPENDENT_AMBULATORY_CARE_PROVIDER_SITE_OTHER): Payer: 59 | Admitting: Family Medicine

## 2014-11-22 ENCOUNTER — Encounter: Payer: Self-pay | Admitting: Family Medicine

## 2014-11-22 ENCOUNTER — Other Ambulatory Visit: Payer: Self-pay | Admitting: Family Medicine

## 2014-11-22 VITALS — BP 114/64 | HR 83 | Temp 98.3°F | Ht 68.75 in | Wt 179.0 lb

## 2014-11-22 DIAGNOSIS — E079 Disorder of thyroid, unspecified: Secondary | ICD-10-CM

## 2014-11-22 DIAGNOSIS — E1149 Type 2 diabetes mellitus with other diabetic neurological complication: Secondary | ICD-10-CM

## 2014-11-22 DIAGNOSIS — K219 Gastro-esophageal reflux disease without esophagitis: Secondary | ICD-10-CM

## 2014-11-22 DIAGNOSIS — E785 Hyperlipidemia, unspecified: Secondary | ICD-10-CM

## 2014-11-22 DIAGNOSIS — E1140 Type 2 diabetes mellitus with diabetic neuropathy, unspecified: Secondary | ICD-10-CM | POA: Diagnosis not present

## 2014-11-22 DIAGNOSIS — IMO0002 Reserved for concepts with insufficient information to code with codable children: Secondary | ICD-10-CM

## 2014-11-22 DIAGNOSIS — M545 Low back pain: Secondary | ICD-10-CM

## 2014-11-22 DIAGNOSIS — E1165 Type 2 diabetes mellitus with hyperglycemia: Secondary | ICD-10-CM

## 2014-11-22 DIAGNOSIS — I25810 Atherosclerosis of coronary artery bypass graft(s) without angina pectoris: Secondary | ICD-10-CM

## 2014-11-22 DIAGNOSIS — G4733 Obstructive sleep apnea (adult) (pediatric): Secondary | ICD-10-CM

## 2014-11-22 DIAGNOSIS — I10 Essential (primary) hypertension: Secondary | ICD-10-CM

## 2014-11-22 LAB — LIPID PANEL
CHOL/HDL RATIO: 4
Cholesterol: 112 mg/dL (ref 0–200)
HDL: 30.9 mg/dL — AB (ref >39.00)
LDL Cholesterol: 62 mg/dL (ref 0–99)
NonHDL: 81.1
Triglycerides: 94 mg/dL (ref 0.0–149.0)
VLDL: 18.8 mg/dL (ref 0.0–40.0)

## 2014-11-22 LAB — CBC
HCT: 42.3 % (ref 39.0–52.0)
Hemoglobin: 14 g/dL (ref 13.0–17.0)
MCHC: 33.2 g/dL (ref 30.0–36.0)
MCV: 91.2 fl (ref 78.0–100.0)
PLATELETS: 177 10*3/uL (ref 150.0–400.0)
RBC: 4.64 Mil/uL (ref 4.22–5.81)
RDW: 13.5 % (ref 11.5–15.5)
WBC: 11.4 10*3/uL — ABNORMAL HIGH (ref 4.0–10.5)

## 2014-11-22 LAB — RENAL FUNCTION PANEL
Albumin: 4.2 g/dL (ref 3.5–5.2)
BUN: 13 mg/dL (ref 6–23)
CHLORIDE: 100 meq/L (ref 96–112)
CO2: 24 meq/L (ref 19–32)
Calcium: 9 mg/dL (ref 8.4–10.5)
Creatinine, Ser: 1.1 mg/dL (ref 0.4–1.5)
GFR: 75.06 mL/min (ref >60.00)
Glucose, Bld: 204 mg/dL — ABNORMAL HIGH (ref 70–99)
Phosphorus: 3.4 mg/dL (ref 2.3–4.6)
Potassium: 4 mEq/L (ref 3.5–5.1)
SODIUM: 134 meq/L — AB (ref 135–145)

## 2014-11-22 LAB — HEPATIC FUNCTION PANEL
ALBUMIN: 4.2 g/dL (ref 3.5–5.2)
ALT: 17 U/L (ref 0–53)
AST: 16 U/L (ref 0–37)
Alkaline Phosphatase: 78 U/L (ref 39–117)
Bilirubin, Direct: 0.1 mg/dL (ref 0.0–0.3)
TOTAL PROTEIN: 7.8 g/dL (ref 6.0–8.3)
Total Bilirubin: 1.3 mg/dL — ABNORMAL HIGH (ref 0.2–1.2)

## 2014-11-22 LAB — HEMOGLOBIN A1C: HEMOGLOBIN A1C: 9.8 % — AB (ref 4.6–6.5)

## 2014-11-22 NOTE — Progress Notes (Signed)
[name] GEWIRTZ  [id number] 13-Jan-1946 11/22/2014      Progress Note-Follow Up  Subjective  Chief Complaint  Chief Complaint  Patient presents with  . Establish Care    head congestion,diabetes    HPI  Patient is a 68 y.o. male in today for routine medical care. Patient is in today to establish care. Reports his last hemoglobin A1c was 11. He denies polyuria or polydipsia. Only complaint acutely today is of some congestion. No fevers or chills. Struggles with chronic low back pain and bilateral lower extremity radiculopathy has trouble with his unsteady gait at times. No acute changes recently. Denies CP/palp/SOB/HA/congestion/fevers/GI or GU c/o. Taking meds as prescribed  Past Medical History  Diagnosis Date  . Diabetes mellitus type II, uncontrolled   . OSA (obstructive sleep apnea)   . Hyperlipidemia   . RBBB (right bundle branch block)   . Depression     Bipolar  . Edema   . Hypertension   . Falling episodes     these have occurred in the past and again recurring 2011  . Spondylosis     C5-6, C6-7 MRI 2010  . CAD (coronary artery disease)     A. CABG in 2000,status post cardiac cath in 2006, 2009 ....continued chest pain and SOB despite oral medication adjestments including Ranexa. B. Cath November 2009/ DES  Stent...11/26/2008 to distal  RCA leading to acute marginal.  C. Cath 07/2012 for CP - stable anatomy, med rx.  Marland Kitchen Hx of CABG     2000,  / one median sternotomy suture broken her chest x-ray November, 2010, no clinical significance  . Palpitations     event recorder showed sinus rhythm  . Nephrolithiasis   . Restless leg   . Dizziness   . Shortness of breath     CPX April, 2011, mild functional limitation, no clear pulmonary or cardiac limitation, possible deconditioning and mild chronotropic incompetence( peak heart rate 130)  . Cerebral ischemia     MRI November, 2010, chronic microvascular ischemia  . Anemia     hemoglobin 7.4, iron deficiency, January,  2011, 2 unit transfusion, endoscopy normal, capsule endoscopy February, 2011 no small bowel abnormalities.   Most likely source gastric erosions, followed by GI  . H/O medication noncompliance     Due to loss of insurance  . Ejection fraction     EF 60%, echo, July 31, 2012  . Diabetes mellitus without complication     Past Surgical History  Procedure Laterality Date  . Nasal septum surgery      UP3  . Coronary artery bypass graft      2000  . Left heart catheterization with coronary/graft angiogram N/A 08/01/2012    Procedure: LEFT HEART CATHETERIZATION WITH Beatrix Fetters;  Surgeon: [name], MD;  Location: [facility] CATH LAB;  Service: Cardiovascular;  Laterality: N/A;    Family History  Problem Relation Age of Onset  . Cancer Brother     pancreatic cancer  . Diabetes Brother     4   . Stroke Brother   . Diabetes Brother   . Cancer Brother   . Diabetes Brother   . Coronary artery disease Brother   . Diabetes Brother   . Hypothyroidism Brother   . Heart attack      Nephew    History   Social History  . Marital Status: Married    Spouse Name: N/A    Number of Children: N/A  . Years of Education: N/A  Occupational History  . Not on file.   Social History Main Topics  . Smoking status: Never Smoker   . Smokeless tobacco: Never Used  . Alcohol Use: No  . Drug Use: No  . Sexual Activity: Not on file   Other Topics Concern  . Not on file   Social History Narrative    Current Outpatient Prescriptions on File Prior to Visit  Medication Sig Dispense Refill  . aspirin EC 81 MG tablet Take 81 mg by mouth at bedtime.     Marland Kitchen BYSTOLIC 10 MG tablet TAKE 1 TABLET BY MOUTH EVERY DAY 30 tablet 0  . clotrimazole-betamethasone (LOTRISONE) cream APPLY TO THE AFFECTED TWICE DAILY 30 g 0  . diclofenac sodium (VOLTAREN) 1 % GEL Apply 2 g topically 3 (three) times daily as needed. For pain to affected area 2 Tube 3  . fish oil-omega-3 fatty acids 1000 MG capsule  Take 2 g by mouth 2 (two) times daily.    . furosemide (LASIX) 20 MG tablet Take 1 tablet (20 mg total) by mouth daily. 90 tablet 1  . gabapentin (NEURONTIN) 300 MG capsule Take 300 mg by mouth 3 (three) times daily.    Marland Kitchen glucose blood (BAYER CONTOUR NEXT TEST) test strip Use 3-4 times per day as instructed 100 each 5  . hyoscyamine (LEVSIN, ANASPAZ) 0.125 MG tablet Take 1 tablet (0.125 mg total) by mouth every 8 (eight) hours as needed for cramping. 30 tablet 0  . insulin aspart (NOVOLOG FLEXPEN) 100 UNIT/ML injection Use 5 to 15 unit 3 times daily before meals 15 mL 5  . Insulin Pen Needle (COMFORT EZ PEN NEEDLES) 31G X 8 MM MISC 1 each by Other route 4 (four) times daily. 100 each 5  . isosorbide mononitrate (IMDUR) 120 MG 24 hr tablet Take 1 tablet (120 mg total) by mouth daily. 90 tablet 1  . LANTUS SOLOSTAR 100 UNIT/ML Solostar Pen INJECT 40 UNITS INTO SKIN AT BEDTIME 15 mL 0  . levothyroxine (SYNTHROID, LEVOTHROID) 75 MCG tablet Take 1 tablet (75 mcg total) by mouth daily. 90 tablet 1  . metFORMIN (GLUCOPHAGE) 1000 MG tablet Take 0.5 tablets (500 mg total) by mouth 2 (two) times daily with a meal. 180 tablet 1  . nitroGLYCERIN (NITROSTAT) 0.4 MG SL tablet Place 1 tablet (0.4 mg total) under the tongue every 5 (five) minutes as needed (up to 3 doses). 25 tablet 4  . pantoprazole (PROTONIX) 40 MG tablet Take 1 tablet (40 mg total) by mouth daily. 90 tablet 1  . pramipexole (MIRAPEX) 0.25 MG tablet Take 1 tablet (0.25 mg total) by mouth at bedtime. 90 tablet 1  . sertraline (ZOLOFT) 100 MG tablet Take 1 tablet (100 mg total) by mouth 2 (two) times daily. 180 tablet 1  . simvastatin (ZOCOR) 40 MG tablet Take 1 tablet (40 mg total) by mouth every evening. 90 tablet 1  . zolpidem (AMBIEN) 5 MG tablet Take 1 tablet (5 mg total) by mouth at bedtime as needed for sleep. 30 tablet 3  . HYDROcodone-acetaminophen (NORCO/VICODIN) 5-325 MG per tablet Take 2 tablets by mouth every 4 (four) hours as needed.  (Patient not taking: Reported on 11/22/2014) 20 tablet 0   No current facility-administered medications on file prior to visit.    Allergies  Allergen Reactions  . Morphine     REACTION: hallucinations    Review of Systems  Review of Systems  Constitutional: Positive for malaise/fatigue. Negative for fever and chills.  HENT: Negative for  congestion, hearing loss and nosebleeds.   Eyes: Negative for discharge.  Respiratory: Negative for cough, sputum production, shortness of breath and wheezing.   Cardiovascular: Negative for chest pain, palpitations and leg swelling.  Gastrointestinal: Negative for heartburn, nausea, vomiting, abdominal pain, diarrhea, constipation and blood in stool.  Genitourinary: Negative for dysuria, urgency, frequency and hematuria.  Musculoskeletal: Positive for back pain and joint pain. Negative for myalgias and falls.  Skin: Negative for rash.  Neurological: Negative for dizziness, tremors, sensory change, focal weakness, loss of consciousness, weakness and headaches.  Endo/Heme/Allergies: Negative for polydipsia. Does not bruise/bleed easily.  Psychiatric/Behavioral: Negative for depression and suicidal ideas. The patient is not nervous/anxious and does not have insomnia.     Objective  BP 114/64 mmHg  Pulse 83  Temp(Src) 98.3 F (36.8 C) (Oral)  Ht 5' 8.75" (1.746 m)  Wt 179 lb (81.194 kg)  BMI 26.63 kg/m2  SpO2 100%  Physical Exam  Physical Exam  Constitutional: He is oriented to person, place, and time and well-developed, well-nourished, and in no distress. No distress.  HENT:  Head: Normocephalic and atraumatic.  Eyes: Conjunctivae are normal.  Neck: Neck supple. No thyromegaly present.  Cardiovascular: Normal rate, regular rhythm and normal heart sounds.   No murmur heard. Pulmonary/Chest: Effort normal and breath sounds normal. No respiratory distress.  Abdominal: He exhibits no distension and no mass. There is no tenderness.    Musculoskeletal: He exhibits no edema.  Neurological: He is alert and oriented to person, place, and time.  Skin: Skin is warm.  Psychiatric: Memory, affect and judgment normal.    Lab Results  Component Value Date   TSH 1.78 10/24/2012   Lab Results  Component Value Date   WBC 8.6 03/06/2013   HGB 13.9 03/06/2013   HCT 40.7 03/06/2013   MCV 90.9 03/06/2013   PLT 177.0 03/06/2013   Lab Results  Component Value Date   CREATININE 0.9 03/06/2013   BUN 9 03/06/2013   NA 138 03/06/2013   K 3.9 03/06/2013   CL 102 03/06/2013   CO2 27 03/06/2013   Lab Results  Component Value Date   ALT 27 03/06/2013   AST 24 03/06/2013   ALKPHOS 62 03/06/2013   BILITOT 0.6 03/06/2013   Lab Results  Component Value Date   CHOL 99 09/21/2012   Lab Results  Component Value Date   HDL 28.10* 09/21/2012   Lab Results  Component Value Date   LDLCALC 133* 07/31/2012   Lab Results  Component Value Date   TRIG 244.0* 09/21/2012   Lab Results  Component Value Date   CHOLHDL 4 09/21/2012     Assessment & Plan  OSA (obstructive sleep apnea) Previously diagnosed roughly 10 years ago per patient, tried CPAP in past and did not tolerate. Will refer for new work up and treatment as needed.  CAD (coronary artery disease) asymptomatic at this time. No change in meds  Hypertension Well controlled, no changes to meds. Encouraged heart healthy diet such as the DASH diet and exercise as tolerated.   GERD Avoid offending foods, start probiotics. Do not eat large meals in late evening and consider raising head of bed.   Hypothyroidism On Levothyroxine, continue to monitor  Low back pain Encouraged moist heat and gentle stretching as tolerated. May try NSAIDs and prescription meds as directed and report if symptoms worsen or seek immediate care. Has responded to cortisone shots in past  Hyperlipidemia Tolerating statin, encouraged heart healthy diet, avoid trans fats, minimize  simple  carbs and saturated fats. Increase exercise as tolerated  Diabetes mellitus type II, uncontrolled Patient reports last A1C was 11, repeat prior to next visit, minimize simple carbs. Increase exercise as tolerated. Continue current meds for now.

## 2014-11-22 NOTE — Patient Instructions (Signed)

## 2014-11-22 NOTE — Progress Notes (Signed)
Pre visit review using our clinic review tool, if applicable. No additional management support is needed unless otherwise documented below in the visit note. 

## 2014-11-22 NOTE — Assessment & Plan Note (Signed)
Previously diagnosed roughly 10 years ago per patient, tried CPAP in past and did not tolerate. Will refer for new work up and treatment as needed.

## 2014-11-25 ENCOUNTER — Encounter: Payer: Self-pay | Admitting: Family Medicine

## 2014-11-25 NOTE — Assessment & Plan Note (Signed)
Well controlled, no changes to meds. Encouraged heart healthy diet such as the DASH diet and exercise as tolerated.  °

## 2014-11-25 NOTE — Assessment & Plan Note (Signed)
Avoid offending foods, start probiotics. Do not eat large meals in late evening and consider raising head of bed.  

## 2014-11-25 NOTE — Assessment & Plan Note (Signed)
Tolerating statin, encouraged heart healthy diet, avoid trans fats, minimize simple carbs and saturated fats. Increase exercise as tolerated 

## 2014-11-25 NOTE — Assessment & Plan Note (Signed)
Encouraged moist heat and gentle stretching as tolerated. May try NSAIDs and prescription meds as directed and report if symptoms worsen or seek immediate care. Has responded to cortisone shots in past

## 2014-11-25 NOTE — Assessment & Plan Note (Signed)
On Levothyroxine, continue to monitor 

## 2014-11-25 NOTE — Assessment & Plan Note (Signed)
Patient reports last A1C was 11, repeat prior to next visit, minimize simple carbs. Increase exercise as tolerated. Continue current meds for now.

## 2014-11-25 NOTE — Assessment & Plan Note (Signed)
asymptomatic at this time. No change in meds

## 2014-11-26 LAB — TSH: TSH: 1.464 u[IU]/mL (ref 0.350–4.500)

## 2014-12-09 ENCOUNTER — Ambulatory Visit: Payer: 59 | Admitting: Internal Medicine

## 2014-12-12 ENCOUNTER — Ambulatory Visit (INDEPENDENT_AMBULATORY_CARE_PROVIDER_SITE_OTHER): Payer: 59 | Admitting: Internal Medicine

## 2014-12-12 ENCOUNTER — Encounter: Payer: Self-pay | Admitting: Internal Medicine

## 2014-12-12 VITALS — BP 114/62 | HR 79 | Temp 98.1°F | Resp 12 | Ht 69.0 in | Wt 181.6 lb

## 2014-12-12 DIAGNOSIS — E1159 Type 2 diabetes mellitus with other circulatory complications: Secondary | ICD-10-CM

## 2014-12-12 MED ORDER — INSULIN GLARGINE 300 UNIT/ML ~~LOC~~ SOPN: 36.0000 [IU] | PEN_INJECTOR | Freq: Every day | SUBCUTANEOUS | Status: DC

## 2014-12-12 MED ORDER — INSULIN ASPART 100 UNIT/ML ~~LOC~~ SOLN: SUBCUTANEOUS | Status: DC

## 2014-12-12 MED ORDER — METFORMIN HCL 1000 MG PO TABS: 1000.0000 mg | ORAL_TABLET | Freq: Two times a day (BID) | ORAL | Status: DC

## 2014-12-12 NOTE — Patient Instructions (Addendum)
Please increase Metformin to 1000 mg 2x a day. Stop Lantus. Start Toujeo 36 units at bedtime. Change the NovoLog regimen as follows - please inject 15 min before a meal: 10 units with a smaller meal 14 units for a larger meal Add the following NovoLog Sliding Scale: - 150-175: + 1 unit  - 176-200: + 2 units  - 201-225: + 3 units  - 226-250: + 4 units  - 251-275: + 5 units - 276-300: + 6 units Please let me know if the sugars are consistently <80 or >200.  When injecting insulin:  Inject in the abdomen  Rotate the injection sites around the belly button  Change needle for each injection  Keep needle in for 10 sec after last unit of insulin in  Keep the insulin in use out of the fridge  Please call and schedule an eye appt with Dr. Prudencio Burly: [facility]:  Dr. Sherlyn Lick MD ?  Address: [address], [place], [place] [address]  [phone]  PATIENT INSTRUCTIONS FOR TYPE 2 DIABETES:  **Please join MyChart!** - see attached instructions about how to join if you have not done so already.  DIET AND EXERCISE Diet and exercise is an important part of diabetic treatment.  We recommended aerobic exercise in the form of brisk walking (working between 40-60% of maximal aerobic capacity, similar to brisk walking) for 150 minutes per week (such as 30 minutes five days per week) along with 3 times per week performing 'resistance' training (using various gauge rubber tubes with handles) 5-10 exercises involving the major muscle groups (upper body, lower body and core) performing 10-15 repetitions (or near fatigue) each exercise. Start at half the above goal but build slowly to reach the above goals. If limited by weight, joint pain, or disability, we recommend daily walking in a swimming pool with water up to waist to reduce pressure from joints while allow for adequate exercise.    BLOOD GLUCOSES Monitoring your blood glucoses is important for continued  management of your diabetes. Please check your blood glucoses 2-4 times a day: fasting, before meals and at bedtime (you can rotate these measurements - e.g. one day check before the 3 meals, the next day check before 2 of the meals and before bedtime, etc.).   HYPOGLYCEMIA (low blood sugar) Hypoglycemia is usually a reaction to not eating, exercising, or taking too much insulin/ other diabetes drugs.  Symptoms include tremors, sweating, hunger, confusion, headache, etc. Treat IMMEDIATELY with 15 grams of Carbs: . 4 glucose tablets .  cup regular juice/soda . 2 tablespoons raisins . 4 teaspoons sugar . 1 tablespoon honey Recheck blood glucose in 15 mins and repeat above if still symptomatic/blood glucose <100.  RECOMMENDATIONS TO REDUCE YOUR RISK OF DIABETIC COMPLICATIONS: * Take your prescribed MEDICATION(S) * Follow a DIABETIC diet: Complex carbs, fiber rich foods, (monounsaturated and polyunsaturated) fats * AVOID saturated/trans fats, high fat foods, >2,300 mg salt per day. * EXERCISE at least 5 times a week for 30 minutes or preferably daily.  * DO NOT SMOKE OR DRINK more than 1 drink a day. * Check your FEET every day. Do not wear tightfitting shoes. Contact us if you develop an ulcer * See your EYE doctor once a year or more if needed * Get a FLU shot once a year * Get a PNEUMONIA vaccine once before and once after age 34 years  GOALS:  * Your Hemoglobin A1c of <7%  * fasting sugars need to be <  130 * after meals sugars need to be <180 (2h after you start eating) * Your Systolic BP should be [id number] or lower  * Your Diastolic BP should be 80 or lower  * Your HDL (Good Cholesterol) should be 40 or higher  * Your LDL (Bad Cholesterol) should be 100 or lower. * Your Triglycerides should be 150 or lower  * Your Urine microalbumin (kidney function) should be <30 * Your Body Mass Index should be 25 or lower    Please consider the following ways to cut down carbs and fat and increase  fiber and micronutrients in your diet: - substitute whole grain for white bread or pasta - substitute brown rice for white rice - substitute 90-calorie flat bread pieces for slices of bread when possible - substitute sweet potatoes or yams for white potatoes - substitute humus for margarine - substitute tofu for cheese when possible - substitute almond or rice milk for regular milk (would not drink soy milk daily due to concern for soy estrogen influence on breast cancer risk) - substitute dark chocolate for other sweets when possible - substitute water - can add lemon or orange slices for taste - for diet sodas (artificial sweeteners will trick your body that you can eat sweets without getting calories and will lead you to overeating and weight gain in the long run) - do not skip breakfast or other meals (this will slow down the metabolism and will result in more weight gain over time)  - can try smoothies made from fruit and almond/rice milk in am instead of regular breakfast - can also try old-fashioned (not instant) oatmeal made with almond/rice milk in am - order the dressing on the side when eating salad at a restaurant (pour less than half of the dressing on the salad) - eat as little meat as possible - can try juicing, but should not forget that juicing will get rid of the fiber, so would alternate with eating raw veg./fruits or drinking smoothies - use as little oil as possible, even when using olive oil - can dress a salad with a mix of balsamic vinegar and lemon juice, for e.g. - use agave nectar, stevia sugar, or regular sugar rather than artificial sweateners - steam or broil/roast veggies  - snack on veggies/fruit/nuts (unsalted, preferably) when possible, rather than processed foods - reduce or eliminate aspartame in diet (it is in diet sodas, chewing gum, etc) Read the labels!  Try to read Dr. Janene Harvey book: "Program for Reversing Diabetes" for other ideas for healthy  eating.

## 2014-12-12 NOTE — Progress Notes (Signed)
Patient ID: [name] [name], male   DOB: 10/10/1946, 69 y.o.   MRN: [id number]  HPI: [name] [name] is a 69 y.o.-year-old male, referred by his PCP, Dr. Charlett Blake, for management of DM2, dx in 1990, insulin-dependent in ~2005, uncontrolled, with complications (CAD, s/p CABG 2000).  Last hemoglobin A1c was: Lab Results  Component Value Date   HGBA1C 9.8* 11/22/2014   HGBA1C 7.6* 03/06/2013   HGBA1C 8.8* 11/30/2012  He tells me that his HbA1c increased b/c ran out of meds >> 08-11/2014.  Pt is on a regimen of: - Metformin 500 mg po bid - Lantus 42 units qhs - Novolog 6-12 units tid ac, rarely 20 units  >150: 4-6 units >200: 12 units before meal He was started on Alogliptin-Pioglitazone 25-15 mg daily (Oseni) >> samples, but could not afford it >> stopped in 07/2014.  Pt checks his sugars 3-4 a day and they are: - am: 80-294, mostly 200s - 2h after b'fast: n/c - before lunch: 65, 536 - 2h after lunch: 283 - before dinner: 91-315 - 2h after dinner: 315, 384, 478 - bedtime: - nighttime: 3 am: 65 No lows. Lowest sugar was 54 (night >> took M&M); he has hypoglycemia awareness at 70.  Highest sugar was >550 recently - past week.  Glucometer: [employer]  Pt's meals are: - Breakfast: egg mcmuffin or cereal - Lunch: sandwich or hamburger >> can skip  - Dinner: grilled meat, 2 veggies, starches - Snacks: no desserts, fruit (apples, bananas, grapes)  Walks 3x a week.  - mild CKD, last BUN/creatinine:  Lab Results  Component Value Date   BUN 13 11/22/2014   CREATININE 1.1 11/22/2014   - last set of lipids: Lab Results  Component Value Date   CHOL 112 11/22/2014   HDL 30.90* 11/22/2014   LDLCALC 62 11/22/2014   LDLDIRECT 46.5 09/21/2012   TRIG 94.0 11/22/2014   CHOLHDL 4 11/22/2014  On Zocor 40, omega FAs. - last eye exam was several years ago. No DR.  - + mild numbness and tingling in his feet. On Neurontin for RLS.  Pt has FH of DM in mother, 2 brothers.  Pt also  has hypothyroidism, on LT4 75 mcg daily.  Last TSH normal: Lab Results  Component Value Date   TSH 1.464 11/22/2014   Pt also has a h/o HL, GERD, anxiety + depression.  ROS: Constitutional: + weight loss, + fatigue, + subjective hypothermia, + nocturia, + poor sleep Eyes: no blurry vision, no xerophthalmia ENT: no sore throat, no nodules palpated in throat, no dysphagia/odynophagia, no hoarseness, + decreased hearing Cardiovascular:+ CP/+ SOB/no palpitations/leg swelling Respiratory: + cough/+ SOB Gastrointestinal: no N/V/D/C/+ acid reflux Musculoskeletal: + both: muscle/joint aches Skin: no rashes Neurological: no tremors/numbness/tingling/dizziness, + HA Psychiatric: + both: depression/anxiety  Past Medical History  Diagnosis Date  . Diabetes mellitus type II, uncontrolled   . OSA (obstructive sleep apnea)   . Hyperlipidemia   . RBBB (right bundle branch block)   . Depression     Bipolar  . Edema   . Hypertension   . Falling episodes     these have occurred in the past and again recurring 2011  . Spondylosis     C5-6, C6-7 MRI 2010  . CAD (coronary artery disease)     A. CABG in 2000,status post cardiac cath in 2006, 2009 ....continued chest pain and SOB despite oral medication adjestments including Ranexa. B. Cath November 2009/ DES  Stent...11/26/2008 to distal  RCA leading to acute  marginal.  C. Cath 07/2012 for CP - stable anatomy, med rx.  Marland Kitchen Hx of CABG     2000,  / one median sternotomy suture broken her chest x-ray November, 2010, no clinical significance  . Palpitations     event recorder showed sinus rhythm  . Nephrolithiasis   . Restless leg   . Dizziness   . Shortness of breath     CPX April, 2011, mild functional limitation, no clear pulmonary or cardiac limitation, possible deconditioning and mild chronotropic incompetence( peak heart rate 130)  . Cerebral ischemia     MRI November, 2010, chronic microvascular ischemia  . Anemia     hemoglobin 7.4, iron  deficiency, January, 2011, 2 unit transfusion, endoscopy normal, capsule endoscopy February, 2011 no small bowel abnormalities.   Most likely source gastric erosions, followed by GI  . H/O medication noncompliance     Due to loss of insurance  . Ejection fraction     EF 60%, echo, July 31, 2012  . Diabetes mellitus without complication   . Pain in limb 06/12/2009    Qualifier: Diagnosis of  By: [name]   . Low back pain 06/12/2009    Qualifier: Diagnosis of  By: [name]    Past Surgical History  Procedure Laterality Date  . Nasal septum surgery      UP3  . Coronary artery bypass graft      2000  . Left heart catheterization with coronary/graft angiogram N/A 08/01/2012    Procedure: LEFT HEART CATHETERIZATION WITH Beatrix Fetters;  Surgeon: [name], MD;  Location: [facility] CATH LAB;  Service: Cardiovascular;  Laterality: N/A;   History   Social History  . Marital Status: Married    Spouse Name: N/A    Children: yes   Occupational History  . retired   Social History Main Topics  . Smoking status: Never Smoker   . Smokeless tobacco: Never Used  . Alcohol Use: No  . Drug Use: No   Current Outpatient Prescriptions on File Prior to Visit  Medication Sig Dispense Refill  . Alogliptin-Pioglitazone 25-15 MG TABS Take 1 tablet by mouth daily.    Marland Kitchen aspirin EC 81 MG tablet Take 81 mg by mouth at bedtime.     Marland Kitchen atenolol (TENORMIN) 25 MG tablet Take 25 mg by mouth daily.    . clotrimazole-betamethasone (LOTRISONE) cream APPLY TO THE AFFECTED TWICE DAILY 30 g 0  . diclofenac sodium (VOLTAREN) 1 % GEL Apply 2 g topically 3 (three) times daily as needed. For pain to affected area 2 Tube 3  . fish oil-omega-3 fatty acids 1000 MG capsule Take 2 g by mouth 2 (two) times daily.    . furosemide (LASIX) 20 MG tablet Take 1 tablet (20 mg total) by mouth daily. 90 tablet 1  . gabapentin (NEURONTIN) 300 MG capsule Take 300 mg by mouth 3 (three) times daily.    Marland Kitchen  glucose blood (BAYER CONTOUR NEXT TEST) test strip Use 3-4 times per day as instructed 100 each 5  . HYDROcodone-acetaminophen (NORCO/VICODIN) 5-325 MG per tablet Take 2 tablets by mouth every 4 (four) hours as needed. 20 tablet 0  . hyoscyamine (LEVSIN, ANASPAZ) 0.125 MG tablet Take 1 tablet (0.125 mg total) by mouth every 8 (eight) hours as needed for cramping. 30 tablet 0  . insulin aspart (NOVOLOG FLEXPEN) 100 UNIT/ML injection Use 5 to 15 unit 3 times daily before meals 15 mL 5  . Insulin Pen Needle (  COMFORT EZ PEN NEEDLES) 31G X 8 MM MISC 1 each by Other route 4 (four) times daily. 100 each 5  . isosorbide mononitrate (IMDUR) 120 MG 24 hr tablet Take 1 tablet (120 mg total) by mouth daily. 90 tablet 1  . LANTUS SOLOSTAR 100 UNIT/ML Solostar Pen INJECT 40 UNITS INTO SKIN AT BEDTIME 15 mL 0  . levothyroxine (SYNTHROID, LEVOTHROID) 75 MCG tablet Take 1 tablet (75 mcg total) by mouth daily. 90 tablet 1  . metFORMIN (GLUCOPHAGE) 1000 MG tablet Take 0.5 tablets (500 mg total) by mouth 2 (two) times daily with a meal. 180 tablet 1  . nitroGLYCERIN (NITROSTAT) 0.4 MG SL tablet Place 1 tablet (0.4 mg total) under the tongue every 5 (five) minutes as needed (up to 3 doses). 25 tablet 4  . pantoprazole (PROTONIX) 40 MG tablet Take 1 tablet (40 mg total) by mouth daily. 90 tablet 1  . pramipexole (MIRAPEX) 0.25 MG tablet Take 1 tablet (0.25 mg total) by mouth at bedtime. 90 tablet 1  . Probiotic Product (ALIGN) 4 MG CAPS Take 1 capsule by mouth daily.    . sertraline (ZOLOFT) 100 MG tablet Take 1 tablet (100 mg total) by mouth 2 (two) times daily. 180 tablet 1  . simvastatin (ZOCOR) 40 MG tablet Take 1 tablet (40 mg total) by mouth every evening. 90 tablet 1  . zolpidem (AMBIEN) 5 MG tablet Take 1 tablet (5 mg total) by mouth at bedtime as needed for sleep. 30 tablet 3   No current facility-administered medications on file prior to visit.   Allergies  Allergen Reactions  . Morphine     REACTION:  hallucinations  . Shellfish-Derived Products Itching   Family History  Problem Relation Age of Onset  . Cancer Brother     pancreatic cancer  . Diabetes Brother     4   . Stroke Brother   . Diabetes Brother   . Cancer Brother   . Diabetes Brother   . Coronary artery disease Brother   . Diabetes Brother   . Hypothyroidism Brother   . Heart attack      Nephew   PE: BP 114/62 mmHg  Pulse 79  Temp(Src) 98.1 F (36.7 C) (Oral)  Resp 12  Ht 5\' 9"  (1.753 m)  Wt 181 lb 9.6 oz (82.373 kg)  BMI 26.81 kg/m2  SpO2 97% Wt Readings from Last 3 Encounters:  12/12/14 181 lb 9.6 oz (82.373 kg)  11/22/14 179 lb (81.194 kg)  10/30/14 185 lb (83.915 kg)   Constitutional: slightly overweight, in NAD Eyes: PERRLA, EOMI, no exophthalmos ENT: moist mucous membranes, no thyromegaly, no cervical lymphadenopathy Cardiovascular: RRR, No MRG Respiratory: CTA B Gastrointestinal: abdomen soft, NT, ND, BS+ Musculoskeletal: no deformities, strength intact in all 4 Skin: moist, warm, no rashes Neurological: no tremor with outstretched hands, DTR normal in all 4  ASSESSMENT: 1. DM2, insulin-dependent, uncontrolled, with complications - CAD, s/p CABG - Dr [name]  PLAN:  1. Patient with long-standing, uncontrolled diabetes, on basal-bolus insulin regimen, which became insufficient. Sugars are higher as the day goes by and then he has a large drop in CBGs overnight, a sign of too much basal insulin and too little mealtime insulin. Will decrease Lantus, increase metformin and increase Novolog (add mealtime insulin along with SSI). Also, discussed correct injection technique. He was not keeping the needle in after the insulin injected, also, he was injecting at the start of the meals, not before. - We discussed about options for treatment,  and I suggested to:  Patient Instructions  Please increase Metformin to 1000 mg 2x a day. Stop Lantus. Start Toujeo 36 units at bedtime. Change the NovoLog regimen as  follows - please inject 15 min before a meal: 10 units with a smaller meal 14 units for a larger meal Add the following NovoLog Sliding Scale: - 150-175: + 1 unit  - 176-200: + 2 units  - 201-225: + 3 units  - 226-250: + 4 units  - 251-275: + 5 units - 276-300: + 6 units Please let me know if the sugars are consistently <80 or >200.  When injecting insulin:  Inject in the abdomen  Rotate the injection sites around the belly button  Change needle for each injection  Keep needle in for 10 sec after last unit of insulin in  Keep the insulin in use out of the fridge  Please call and schedule an eye appt with Dr. Prudencio Burly: [facility]:  Dr. Sherlyn Lick MD ?  Address: [address], [place], [place] [address]  [phone]  - continue checking sugars at different times of the day - check 3-4 times a day, rotating checks - given sugar log and advised how to fill it and to bring it at next appt  - given foot care handout and explained the principles  - given instructions for hypoglycemia management "15-15 rule"  - advised for yearly eye exams >> needs ne >> recommended to see Dr Prudencio Burly - Return to clinic in 1 mo with sugar log   - time spent with the patient: 1 hour, of which >50% was spent in obtaining information about his DM, reviewing previous labs and treatments, reviewing his sugar log, counseling him and his wife about his condition (please see the discussed topics above), and developing a plan to further investigate it. Hie and his wife had a number of questions which I addressed.

## 2014-12-13 ENCOUNTER — Ambulatory Visit: Payer: 59 | Admitting: Internal Medicine

## 2014-12-20 ENCOUNTER — Telehealth: Payer: Self-pay | Admitting: Internal Medicine

## 2014-12-25 ENCOUNTER — Institutional Professional Consult (permissible substitution): Payer: 59 | Admitting: Pulmonary Disease

## 2014-12-25 NOTE — Telephone Encounter (Signed)
error 

## 2015-01-02 ENCOUNTER — Observation Stay (HOSPITAL_COMMUNITY)
Admission: EM | Admit: 2015-01-02 | Discharge: 2015-01-04 | Disposition: A | Discharge status: Home / Self Care | Payer: Medicare PPO | Attending: Internal Medicine | Admitting: Internal Medicine

## 2015-01-02 ENCOUNTER — Encounter (HOSPITAL_COMMUNITY): Payer: Self-pay | Admitting: Emergency Medicine

## 2015-01-02 ENCOUNTER — Emergency Department (HOSPITAL_COMMUNITY): Payer: Medicare PPO

## 2015-01-02 DIAGNOSIS — I10 Essential (primary) hypertension: Secondary | ICD-10-CM | POA: Insufficient documentation

## 2015-01-02 DIAGNOSIS — I4510 Unspecified right bundle-branch block: Secondary | ICD-10-CM | POA: Insufficient documentation

## 2015-01-02 DIAGNOSIS — Z9114 Patient's other noncompliance with medication regimen: Secondary | ICD-10-CM | POA: Insufficient documentation

## 2015-01-02 DIAGNOSIS — E039 Hypothyroidism, unspecified: Secondary | ICD-10-CM | POA: Diagnosis not present

## 2015-01-02 DIAGNOSIS — Z951 Presence of aortocoronary bypass graft: Secondary | ICD-10-CM | POA: Diagnosis not present

## 2015-01-02 DIAGNOSIS — F319 Bipolar disorder, unspecified: Secondary | ICD-10-CM | POA: Diagnosis not present

## 2015-01-02 DIAGNOSIS — Z955 Presence of coronary angioplasty implant and graft: Secondary | ICD-10-CM | POA: Diagnosis not present

## 2015-01-02 DIAGNOSIS — E785 Hyperlipidemia, unspecified: Secondary | ICD-10-CM | POA: Insufficient documentation

## 2015-01-02 DIAGNOSIS — R079 Chest pain, unspecified: Principal | ICD-10-CM | POA: Insufficient documentation

## 2015-01-02 DIAGNOSIS — R0602 Shortness of breath: Secondary | ICD-10-CM | POA: Diagnosis not present

## 2015-01-02 DIAGNOSIS — Z23 Encounter for immunization: Secondary | ICD-10-CM | POA: Diagnosis not present

## 2015-01-02 DIAGNOSIS — Z794 Long term (current) use of insulin: Secondary | ICD-10-CM | POA: Diagnosis not present

## 2015-01-02 DIAGNOSIS — G4733 Obstructive sleep apnea (adult) (pediatric): Secondary | ICD-10-CM | POA: Insufficient documentation

## 2015-01-02 DIAGNOSIS — Z79899 Other long term (current) drug therapy: Secondary | ICD-10-CM | POA: Diagnosis not present

## 2015-01-02 DIAGNOSIS — Z79891 Long term (current) use of opiate analgesic: Secondary | ICD-10-CM | POA: Diagnosis not present

## 2015-01-02 DIAGNOSIS — I2510 Atherosclerotic heart disease of native coronary artery without angina pectoris: Secondary | ICD-10-CM | POA: Diagnosis not present

## 2015-01-02 DIAGNOSIS — IMO0002 Reserved for concepts with insufficient information to code with codable children: Secondary | ICD-10-CM | POA: Diagnosis present

## 2015-01-02 DIAGNOSIS — E1165 Type 2 diabetes mellitus with hyperglycemia: Secondary | ICD-10-CM | POA: Insufficient documentation

## 2015-01-02 HISTORY — DX: Disorder of thyroid, unspecified: E07.9

## 2015-01-02 LAB — BASIC METABOLIC PANEL
Anion gap: 11 (ref 5–15)
BUN: 15 mg/dL (ref 6–23)
CO2: 22 mmol/L (ref 19–32)
Calcium: 9.3 mg/dL (ref 8.4–10.5)
Chloride: 103 mmol/L (ref 96–112)
Creatinine, Ser: 1.14 mg/dL (ref 0.50–1.35)
GFR calc Af Amer: 76 mL/min — ABNORMAL LOW (ref >90)
GFR calc non Af Amer: 65 mL/min — ABNORMAL LOW (ref >90)
Glucose, Bld: 360 mg/dL — ABNORMAL HIGH (ref 70–99)
Potassium: 4 mmol/L (ref 3.5–5.1)
SODIUM: 136 mmol/L (ref 135–145)

## 2015-01-02 LAB — I-STAT TROPONIN, ED: Troponin i, poc: 0 ng/mL (ref 0.00–0.08)

## 2015-01-02 LAB — CBC
HCT: 42.6 % (ref 39.0–52.0)
Hemoglobin: 15 g/dL (ref 13.0–17.0)
MCH: 29.9 pg (ref 26.0–34.0)
MCHC: 35.2 g/dL (ref 30.0–36.0)
MCV: 85 fL (ref 78.0–100.0)
PLATELETS: 165 10*3/uL (ref 150–400)
RBC: 5.01 MIL/uL (ref 4.22–5.81)
RDW: 13.1 % (ref 11.5–15.5)
WBC: 7.9 10*3/uL (ref 4.0–10.5)

## 2015-01-02 LAB — BRAIN NATRIURETIC PEPTIDE: B NATRIURETIC PEPTIDE 5: 15.2 pg/mL (ref 0.0–100.0)

## 2015-01-02 MED ORDER — FUROSEMIDE 20 MG PO TABS
20.0000 mg | ORAL_TABLET | Freq: Every day | ORAL | Status: DC
  Administered 2015-01-03 – 2015-01-04 (×2): 20 mg via ORAL
  Filled 2015-01-02 (×2): qty 1

## 2015-01-02 MED ORDER — METFORMIN HCL 500 MG PO TABS
1000.0000 mg | ORAL_TABLET | Freq: Two times a day (BID) | ORAL | Status: DC
  Filled 2015-01-02: qty 2

## 2015-01-02 MED ORDER — INSULIN ASPART 100 UNIT/ML ~~LOC~~ SOLN
0.0000 [IU] | Freq: Every day | SUBCUTANEOUS | Status: DC
  Administered 2015-01-03 (×2): 4 [IU] via SUBCUTANEOUS

## 2015-01-02 MED ORDER — PNEUMOCOCCAL VAC POLYVALENT 25 MCG/0.5ML IJ INJ
0.5000 mL | INJECTION | INTRAMUSCULAR | Status: AC
  Administered 2015-01-03: 0.5 mL via INTRAMUSCULAR
  Filled 2015-01-02: qty 0.5

## 2015-01-02 MED ORDER — GABAPENTIN 300 MG PO CAPS
300.0000 mg | ORAL_CAPSULE | Freq: Three times a day (TID) | ORAL | Status: DC
  Administered 2015-01-03 (×2): 300 mg via ORAL
  Filled 2015-01-02 (×2): qty 1

## 2015-01-02 MED ORDER — INSULIN ASPART 100 UNIT/ML ~~LOC~~ SOLN
0.0000 [IU] | Freq: Three times a day (TID) | SUBCUTANEOUS | Status: DC
  Administered 2015-01-03 – 2015-01-04 (×2): 2 [IU] via SUBCUTANEOUS

## 2015-01-02 MED ORDER — ACETAMINOPHEN 325 MG PO TABS: 650.0000 mg | ORAL_TABLET | Freq: Four times a day (QID) | ORAL | Status: DC | PRN

## 2015-01-02 MED ORDER — HYOSCYAMINE SULFATE 0.125 MG PO TABS
0.1250 mg | ORAL_TABLET | Freq: Three times a day (TID) | ORAL | Status: DC | PRN
  Filled 2015-01-02: qty 1

## 2015-01-02 MED ORDER — INSULIN GLARGINE 300 UNIT/ML ~~LOC~~ SOPN: 36.0000 [IU] | PEN_INJECTOR | Freq: Every day | SUBCUTANEOUS | Status: DC

## 2015-01-02 MED ORDER — NITROGLYCERIN 0.4 MG SL SUBL: 0.4000 mg | SUBLINGUAL_TABLET | SUBLINGUAL | Status: DC | PRN

## 2015-01-02 MED ORDER — ISOSORBIDE MONONITRATE ER 60 MG PO TB24
120.0000 mg | ORAL_TABLET | Freq: Every day | ORAL | Status: DC
  Administered 2015-01-03 – 2015-01-04 (×2): 120 mg via ORAL
  Filled 2015-01-02 (×2): qty 2

## 2015-01-02 MED ORDER — PRAMIPEXOLE DIHYDROCHLORIDE 0.25 MG PO TABS
0.2500 mg | ORAL_TABLET | Freq: Every day | ORAL | Status: DC
  Administered 2015-01-03 (×2): 0.25 mg via ORAL
  Filled 2015-01-02 (×3): qty 1

## 2015-01-02 MED ORDER — ASPIRIN EC 325 MG PO TBEC
325.0000 mg | DELAYED_RELEASE_TABLET | Freq: Every day | ORAL | Status: DC
  Administered 2015-01-03 – 2015-01-04 (×2): 325 mg via ORAL
  Filled 2015-01-02 (×2): qty 1

## 2015-01-02 MED ORDER — HYDROCODONE-ACETAMINOPHEN 5-325 MG PO TABS: 2.0000 | ORAL_TABLET | ORAL | Status: DC | PRN

## 2015-01-02 MED ORDER — PANTOPRAZOLE SODIUM 40 MG PO TBEC
40.0000 mg | DELAYED_RELEASE_TABLET | Freq: Every day | ORAL | Status: DC
  Administered 2015-01-03 – 2015-01-04 (×2): 40 mg via ORAL
  Filled 2015-01-02 (×2): qty 1

## 2015-01-02 MED ORDER — INSULIN GLARGINE 100 UNIT/ML ~~LOC~~ SOLN
36.0000 [IU] | Freq: Every day | SUBCUTANEOUS | Status: DC
  Administered 2015-01-03 (×2): 36 [IU] via SUBCUTANEOUS
  Filled 2015-01-02 (×3): qty 0.36

## 2015-01-02 MED ORDER — ENOXAPARIN SODIUM 40 MG/0.4ML ~~LOC~~ SOLN
40.0000 mg | SUBCUTANEOUS | Status: DC
  Administered 2015-01-03: 40 mg via SUBCUTANEOUS
  Filled 2015-01-02: qty 0.4

## 2015-01-02 MED ORDER — ACETAMINOPHEN 650 MG RE SUPP: 650.0000 mg | Freq: Four times a day (QID) | RECTAL | Status: DC | PRN

## 2015-01-02 MED ORDER — SODIUM CHLORIDE 0.9 % IV SOLN
INTRAVENOUS | Status: AC
  Administered 2015-01-03: via INTRAVENOUS

## 2015-01-02 MED ORDER — SERTRALINE HCL 100 MG PO TABS
100.0000 mg | ORAL_TABLET | Freq: Two times a day (BID) | ORAL | Status: DC
  Administered 2015-01-03 – 2015-01-04 (×4): 100 mg via ORAL
  Filled 2015-01-02 (×4): qty 1

## 2015-01-02 MED ORDER — ASPIRIN 81 MG PO CHEW
324.0000 mg | CHEWABLE_TABLET | Freq: Once | ORAL | Status: AC
  Administered 2015-01-02: 243 mg via ORAL
  Filled 2015-01-02: qty 4

## 2015-01-02 MED ORDER — SIMVASTATIN 40 MG PO TABS
40.0000 mg | ORAL_TABLET | Freq: Every evening | ORAL | Status: DC
  Administered 2015-01-03: 40 mg via ORAL
  Filled 2015-01-02: qty 1

## 2015-01-02 MED ORDER — SODIUM CHLORIDE 0.9 % IJ SOLN
3.0000 mL | Freq: Two times a day (BID) | INTRAMUSCULAR | Status: DC
  Administered 2015-01-03 – 2015-01-04 (×3): 3 mL via INTRAVENOUS

## 2015-01-02 MED ORDER — LEVOTHYROXINE SODIUM 75 MCG PO TABS
75.0000 ug | ORAL_TABLET | Freq: Every day | ORAL | Status: DC
  Administered 2015-01-03 – 2015-01-04 (×2): 75 ug via ORAL
  Filled 2015-01-02 (×2): qty 1

## 2015-01-02 MED ORDER — ZOLPIDEM TARTRATE 5 MG PO TABS: 5.0000 mg | ORAL_TABLET | Freq: Every evening | ORAL | Status: DC | PRN

## 2015-01-02 NOTE — ED Notes (Signed)
Admitting at bedside 

## 2015-01-02 NOTE — ED Notes (Signed)
Pt. reports central chest pain with SOB , nausea and diaphoresis onset last week , history of CAD/CABG his cardiologist is Dr. [name].

## 2015-01-02 NOTE — H&P (Signed)
[name] [name] is an 69 y.o. male.    Dr. [name] (cardiology)  Chief Complaint: chest pain HPI: 69 yo male with hx of CAD s/p CABG , Dm2 apparently c/o chest pain over the past 2 weeks.  Pt had chest pain this evening prior to supper which he describes as burning. Radiation to the left hand/arm.  Pt states that this pain was similar to pain that precipitated bypass.  + diaphoresis tonight as well as generally fatigued.   Denies fever, chills, cough, palp, n/v, diarrhea, brbpr, black stool.  CXR negative.  EKG=> NSR at 75,  RBBB.  Pt will be admitted for cp.  Pt declines stress testing, "they don't work on him, negative"  Past Medical History  Diagnosis Date  . Diabetes mellitus type II, uncontrolled   . OSA (obstructive sleep apnea)   . Hyperlipidemia   . RBBB (right bundle branch block)   . Depression     Bipolar  . Edema   . Hypertension   . Falling episodes     these have occurred in the past and again recurring 2011  . Spondylosis     C5-6, C6-7 MRI 2010  . CAD (coronary artery disease)     A. CABG in 2000,status post cardiac cath in 2006, 2009 ....continued chest pain and SOB despite oral medication adjestments including Ranexa. B. Cath November 2009/ DES  Stent...11/26/2008 to distal  RCA leading to acute marginal.  C. Cath 07/2012 for CP - stable anatomy, med rx.  Marland Kitchen Hx of CABG     2000,  / one median sternotomy suture broken her chest x-ray November, 2010, no clinical significance  . Palpitations     event recorder showed sinus rhythm  . Nephrolithiasis   . Restless leg   . Dizziness   . Shortness of breath     CPX April, 2011, mild functional limitation, no clear pulmonary or cardiac limitation, possible deconditioning and mild chronotropic incompetence( peak heart rate 130)  . Cerebral ischemia     MRI November, 2010, chronic microvascular ischemia  . Anemia     hemoglobin 7.4, iron deficiency, January, 2011, 2 unit transfusion, endoscopy normal, capsule endoscopy  February, 2011 no small bowel abnormalities.   Most likely source gastric erosions, followed by GI  . H/O medication noncompliance     Due to loss of insurance  . Ejection fraction     EF 60%, echo, July 31, 2012  . Diabetes mellitus without complication   . Pain in limb 06/12/2009    Qualifier: Diagnosis of  By: [name]   . Low back pain 06/12/2009    Qualifier: Diagnosis of  By: [name]   . Thyroid disease     Past Surgical History  Procedure Laterality Date  . Nasal septum surgery      UP3  . Coronary artery bypass graft      2000  . Left heart catheterization with coronary/graft angiogram N/A 08/01/2012    Procedure: LEFT HEART CATHETERIZATION WITH Beatrix Fetters;  Surgeon: [name], MD;  Location: [facility] CATH LAB;  Service: Cardiovascular;  Laterality: N/A;    Family History  Problem Relation Age of Onset  . Cancer Brother     pancreatic cancer  . Diabetes Brother     4   . Stroke Brother   . Diabetes Brother   . Cancer Brother   . Diabetes Brother   . Coronary artery disease Brother   . Diabetes Brother   .  Hypothyroidism Brother   . Heart attack      Nephew   Social History:  reports that he has never smoked. He has never used smokeless tobacco. He reports that he does not drink alcohol or use illicit drugs.  Allergies:  Allergies  Allergen Reactions  . Morphine     REACTION: hallucinations  . Shellfish-Derived Products Itching     (Not in a hospital admission)  Results for orders placed or performed during the hospital encounter of 01/02/15 (from the past 48 hour(s))  CBC     Status: None   Collection Time: 01/02/15  9:00 PM  Result Value Ref Range   WBC 7.9 4.0 - 10.5 K/uL   RBC 5.01 4.22 - 5.81 MIL/uL   Hemoglobin 15.0 13.0 - 17.0 g/dL   HCT 42.6 39.0 - 52.0 %   MCV 85.0 78.0 - 100.0 fL   MCH 29.9 26.0 - 34.0 pg   MCHC 35.2 30.0 - 36.0 g/dL   RDW 13.1 11.5 - 15.5 %   Platelets 165 150 - 400 K/uL  Basic metabolic  panel     Status: Abnormal   Collection Time: 01/02/15  9:00 PM  Result Value Ref Range   Sodium 136 135 - 145 mmol/L   Potassium 4.0 3.5 - 5.1 mmol/L   Chloride 103 96 - 112 mmol/L   CO2 22 19 - 32 mmol/L   Glucose, Bld 360 (H) 70 - 99 mg/dL   BUN 15 6 - 23 mg/dL   Creatinine, Ser 1.14 0.50 - 1.35 mg/dL   Calcium 9.3 8.4 - 10.5 mg/dL   GFR calc non Af Amer 65 (L) >90 mL/min   GFR calc Af Amer 76 (L) >90 mL/min    Comment: (NOTE) The eGFR has been calculated using the CKD EPI equation. This calculation has not been validated in all clinical situations. eGFR's persistently <90 mL/min signify possible Chronic Kidney Disease.    Anion gap 11 5 - 15  BNP (order ONLY if patient complains of dyspnea/SOB AND you have documented it for THIS visit)     Status: None   Collection Time: 01/02/15  9:00 PM  Result Value Ref Range   B Natriuretic Peptide 15.2 0.0 - 100.0 pg/mL  I-stat troponin, ED (not at [facility])     Status: None   Collection Time: 01/02/15  9:17 PM  Result Value Ref Range   Troponin i, poc 0.00 0.00 - 0.08 ng/mL   Comment 3            Comment: Due to the release kinetics of cTnI, a negative result within the first hours of the onset of symptoms does not rule out myocardial infarction with certainty. If myocardial infarction is still suspected, repeat the test at appropriate intervals.    Dg Chest 2 View  01/02/2015   CLINICAL DATA:  Chest pain for 2 weeks. Left shoulder pain. History of CABG. Initial encounter.  EXAM: CHEST  2 VIEW  COMPARISON:  10/30/2014 and 07/30/2012.  FINDINGS: The heart size and mediastinal contours are stable status post CABG. The lungs are clear. There is no pleural effusion or pneumothorax. No osseous abnormalities are seen.  IMPRESSION: Stable postoperative chest.  No active cardiopulmonary process.   Electronically Signed   By: Camie Patience [name]   On: 01/02/2015 21:16    Review of Systems  Constitutional: Negative for fever, chills, weight loss,  malaise/fatigue and diaphoresis.  HENT: Negative for congestion, ear discharge, ear pain, hearing loss, nosebleeds, sore  throat and tinnitus.   Eyes: Negative for blurred vision, double vision, photophobia, pain, discharge and redness.  Respiratory: Negative for cough, hemoptysis, sputum production, shortness of breath, wheezing and stridor.   Cardiovascular: Positive for chest pain. Negative for palpitations, orthopnea, claudication, leg swelling and PND.  Gastrointestinal: Negative for heartburn, nausea, vomiting, abdominal pain, diarrhea, constipation, blood in stool and melena.  Genitourinary: Negative for dysuria, urgency, frequency, hematuria and flank pain.  Musculoskeletal: Negative for myalgias, back pain, joint pain, falls and neck pain.  Skin: Negative for itching and rash.  Neurological: Negative for dizziness, tingling, tremors, sensory change, speech change, focal weakness, seizures, loss of consciousness, weakness and headaches.  Endo/Heme/Allergies: Negative for environmental allergies and polydipsia. Does not bruise/bleed easily.  Psychiatric/Behavioral: Negative for depression, suicidal ideas, hallucinations, memory loss and substance abuse. The patient is not nervous/anxious and does not have insomnia.     Blood pressure 149/68, pulse 78, temperature 98.3 F (36.8 C), temperature source Oral, resp. rate 14, height 5' 9"  (1.753 m), weight 84.369 kg (186 lb), SpO2 97 %. Physical Exam  Constitutional: He is oriented to person, place, and time. He appears well-developed and well-nourished.  HENT:  Head: Normocephalic and atraumatic.  Mouth/Throat: No oropharyngeal exudate.  Eyes: Conjunctivae and EOM are normal. Pupils are equal, round, and reactive to light. No scleral icterus.  Neck: Normal range of motion. Neck supple. No JVD present. No tracheal deviation present. No thyromegaly present.  Cardiovascular: Normal rate and regular rhythm.  Exam reveals no gallop and no friction  rub.   No murmur heard. Respiratory: Effort normal and breath sounds normal. No respiratory distress. He has no wheezes. He has no rales.  GI: Soft. Bowel sounds are normal. He exhibits no distension. There is no tenderness. There is no rebound and no guarding.  Musculoskeletal: Normal range of motion. He exhibits no edema or tenderness.  Lymphadenopathy:    He has no cervical adenopathy.  Neurological: He is alert and oriented to person, place, and time. He has normal reflexes. He displays normal reflexes. No cranial nerve deficit. He exhibits normal muscle tone. Coordination normal.  Skin: Skin is warm and dry. No rash noted. No erythema. No pallor.  Psychiatric: He has a normal mood and affect. His behavior is normal. Judgment and thought content normal.     Assessment/Plan CP Telemetry Check cpk , mb trop q6h x3 Aspirin, statin, b blocker NPO after midnite ED discussed with cardiology who requested that medicine admit the patient We appreciate their input,  For possible cardiac cath  Dm2 fsbs ac and qhs, iss  Hypothyroidism Cont synthroid  Annell Canty 01/02/2015, 10:30 PM

## 2015-01-02 NOTE — ED Notes (Signed)
Attempted report x1. 

## 2015-01-02 NOTE — ED Provider Notes (Signed)
CSN: [id number]     Arrival date & time 01/02/15  2030 History   First MD Initiated Contact with Patient 01/02/15 2117     Chief Complaint  Patient presents with  . Chest Pain     (Consider location/radiation/quality/duration/timing/severity/associated sxs/prior Treatment) HPI  69 year old male presents with chest pain that started tonight. Overall his been having intermittent chest pain and exertional shortness of breath with nausea and diaphoresis over the past 2 weeks. Typical things that do not cause him to be short of breath like walking the dog or walking in the house have caused him to get short of breath over the last 2 weeks. Patient had an episode of chest pain tonight it was similar to prior anginal equivalent to him. Relieved with one dose of nitroglycerin. Now feels normal. His cardiologist is Dr. [name], recently had a cardiac cath in 2013. Of note, patient has not had any of his medicines in the last 3 weeks due to losing his Medicare.  Past Medical History  Diagnosis Date  . Diabetes mellitus type II, uncontrolled   . OSA (obstructive sleep apnea)   . Hyperlipidemia   . RBBB (right bundle branch block)   . Depression     Bipolar  . Edema   . Hypertension   . Falling episodes     these have occurred in the past and again recurring 2011  . Spondylosis     C5-6, C6-7 MRI 2010  . CAD (coronary artery disease)     A. CABG in 2000,status post cardiac cath in 2006, 2009 ....continued chest pain and SOB despite oral medication adjestments including Ranexa. B. Cath November 2009/ DES  Stent...11/26/2008 to distal  RCA leading to acute marginal.  C. Cath 07/2012 for CP - stable anatomy, med rx.  Marland Kitchen Hx of CABG     2000,  / one median sternotomy suture broken her chest x-ray November, 2010, no clinical significance  . Palpitations     event recorder showed sinus rhythm  . Nephrolithiasis   . Restless leg   . Dizziness   . Shortness of breath     CPX April, 2011, mild functional  limitation, no clear pulmonary or cardiac limitation, possible deconditioning and mild chronotropic incompetence( peak heart rate 130)  . Cerebral ischemia     MRI November, 2010, chronic microvascular ischemia  . Anemia     hemoglobin 7.4, iron deficiency, January, 2011, 2 unit transfusion, endoscopy normal, capsule endoscopy February, 2011 no small bowel abnormalities.   Most likely source gastric erosions, followed by GI  . H/O medication noncompliance     Due to loss of insurance  . Ejection fraction     EF 60%, echo, July 31, 2012  . Diabetes mellitus without complication   . Pain in limb 06/12/2009    Qualifier: Diagnosis of  By: [name]   . Low back pain 06/12/2009    Qualifier: Diagnosis of  By: [name]   . Thyroid disease    Past Surgical History  Procedure Laterality Date  . Nasal septum surgery      UP3  . Coronary artery bypass graft      2000  . Left heart catheterization with coronary/graft angiogram N/A 08/01/2012    Procedure: LEFT HEART CATHETERIZATION WITH Beatrix Fetters;  Surgeon: [name], MD;  Location: [facility] CATH LAB;  Service: Cardiovascular;  Laterality: N/A;   Family History  Problem Relation Age of Onset  . Cancer Brother  pancreatic cancer  . Diabetes Brother     4   . Stroke Brother   . Diabetes Brother   . Cancer Brother   . Diabetes Brother   . Coronary artery disease Brother   . Diabetes Brother   . Hypothyroidism Brother   . Heart attack      Nephew   History  Substance Use Topics  . Smoking status: Never Smoker   . Smokeless tobacco: Never Used  . Alcohol Use: No    Review of Systems  Constitutional: Positive for diaphoresis.  Respiratory: Positive for shortness of breath.   Cardiovascular: Positive for chest pain.  Gastrointestinal: Positive for nausea.  All other systems reviewed and are negative.     Allergies  Morphine and Shellfish-derived products  Home Medications   Prior to  Admission medications   Medication Sig Start Date End Date Taking? Authorizing Provider  gabapentin (NEURONTIN) 300 MG capsule Take 300 mg by mouth 3 (three) times daily.   Yes Historical Provider, MD  insulin aspart (NOVOLOG) 100 UNIT/ML injection Use 10-17 unit 3 times daily before meals 12/12/14  Yes Philemon Kingdom, MD  clotrimazole-betamethasone (LOTRISONE) cream APPLY TO THE AFFECTED TWICE DAILY Patient not taking: Reported on 01/02/2015 11/23/13   Lisabeth Pick, MD  diclofenac sodium (VOLTAREN) 1 % GEL Apply 2 g topically 3 (three) times daily as needed. For pain to affected area Patient not taking: Reported on 01/02/2015 10/24/12   Doe-Hyun R Shawna Orleans, DO  furosemide (LASIX) 20 MG tablet Take 1 tablet (20 mg total) by mouth daily. Patient not taking: Reported on 01/02/2015 05/07/13   Doe-Hyun R Shawna Orleans, DO  HYDROcodone-acetaminophen (NORCO/VICODIN) 5-325 MG per tablet Take 2 tablets by mouth every 4 (four) hours as needed. Patient not taking: Reported on 01/02/2015 10/30/14   Fransico Meadow, PA-C  hyoscyamine (LEVSIN, ANASPAZ) 0.125 MG tablet Take 1 tablet (0.125 mg total) by mouth every 8 (eight) hours as needed for cramping. Patient not taking: Reported on 01/02/2015 03/06/13   Doe-Hyun R Shawna Orleans, DO  Insulin Glargine (TOUJEO SOLOSTAR) 300 UNIT/ML SOPN Inject 36 Units into the skin at bedtime. Patient not taking: Reported on 01/02/2015 12/12/14   Philemon Kingdom, MD  isosorbide mononitrate (IMDUR) 120 MG 24 hr tablet Take 1 tablet (120 mg total) by mouth daily. Patient not taking: Reported on 01/02/2015 05/08/13   Doe-Hyun R Shawna Orleans, DO  levothyroxine (SYNTHROID, LEVOTHROID) 75 MCG tablet Take 1 tablet (75 mcg total) by mouth daily. Patient not taking: Reported on 01/02/2015 05/07/13   Doe-Hyun R Shawna Orleans, DO  metFORMIN (GLUCOPHAGE) 1000 MG tablet Take 1 tablet (1,000 mg total) by mouth 2 (two) times daily with a meal. Patient not taking: Reported on 01/02/2015 12/12/14   Philemon Kingdom, MD  nitroGLYCERIN (NITROSTAT) 0.4 MG  SL tablet Place 1 tablet (0.4 mg total) under the tongue every 5 (five) minutes as needed (up to 3 doses). Patient not taking: Reported on 01/02/2015 05/08/13   Doe-Hyun R Shawna Orleans, DO  pantoprazole (PROTONIX) 40 MG tablet Take 1 tablet (40 mg total) by mouth daily. Patient not taking: Reported on 01/02/2015 05/08/13   Doe-Hyun R Shawna Orleans, DO  pramipexole (MIRAPEX) 0.25 MG tablet Take 1 tablet (0.25 mg total) by mouth at bedtime. Patient not taking: Reported on 01/02/2015 05/08/13   Doe-Hyun R Shawna Orleans, DO  sertraline (ZOLOFT) 100 MG tablet Take 1 tablet (100 mg total) by mouth 2 (two) times daily. Patient not taking: Reported on 01/02/2015 05/07/13   Doe-Hyun R Shawna Orleans, DO  simvastatin (ZOCOR)  40 MG tablet Take 1 tablet (40 mg total) by mouth every evening. Patient not taking: Reported on 01/02/2015 05/11/13   Doe-Hyun R Shawna Orleans, DO  zolpidem (AMBIEN) 5 MG tablet Take 1 tablet (5 mg total) by mouth at bedtime as needed for sleep. Patient not taking: Reported on 01/02/2015 03/06/13   Doe-Hyun R Shawna Orleans, DO   BP 134/63 mmHg  Pulse 61  Temp(Src) 98.3 F (36.8 C) (Oral)  Resp 14  Ht 5\' 9"  (1.753 m)  Wt 186 lb (84.369 kg)  BMI 27.45 kg/m2  SpO2 95% Physical Exam  Constitutional: He is oriented to person, place, and time. He appears well-developed and well-nourished.  HENT:  Head: Normocephalic and atraumatic.  Right Ear: External ear normal.  Left Ear: External ear normal.  Nose: Nose normal.  Eyes: Right eye exhibits no discharge. Left eye exhibits no discharge.  Neck: Neck supple.  Cardiovascular: Normal rate, regular rhythm, normal heart sounds and intact distal pulses.   Pulmonary/Chest: Effort normal and breath sounds normal.  Abdominal: Soft. There is no tenderness.  Musculoskeletal: He exhibits no edema.  Neurological: He is alert and oriented to person, place, and time.  Skin: Skin is warm and dry. He is not diaphoretic.  Nursing note and vitals reviewed.   ED Course  Procedures (including critical care time) Labs  Review Labs Reviewed  BASIC METABOLIC PANEL - Abnormal; Notable for the following:    Glucose, Bld 360 (*)    GFR calc non Af Amer 65 (*)    GFR calc Af Amer 76 (*)    All other components within normal limits  CBC  BRAIN NATRIURETIC PEPTIDE  HEMOGLOBIN A1C  TROPONIN I  TROPONIN I  TROPONIN I  LIPID PANEL  CK TOTAL AND CKMB  CK TOTAL AND CKMB  CK TOTAL AND CKMB  I-STAT TROPOININ, ED    Imaging Review Dg Chest 2 View  01/02/2015   CLINICAL DATA:  Chest pain for 2 weeks. Left shoulder pain. History of CABG. Initial encounter.  EXAM: CHEST  2 VIEW  COMPARISON:  10/30/2014 and 07/30/2012.  FINDINGS: The heart size and mediastinal contours are stable status post CABG. The lungs are clear. There is no pleural effusion or pneumothorax. No osseous abnormalities are seen.  IMPRESSION: Stable postoperative chest.  No active cardiopulmonary process.   Electronically Signed   By: Camie Patience [name]   On: 01/02/2015 21:16     EKG Interpretation   Date/Time:  Thursday January 02 2015 20:45:30 EST Ventricular Rate:  75 PR Interval:  166 QRS Duration: 120 QT Interval:  412 QTC Calculation: 460 R Axis:   8 Text Interpretation:  Normal sinus rhythm Right bundle branch block  Abnormal ECG no significant change since 2013 Confirmed by Alera Quevedo  MD,  Teonna Coonan ([id number]) on 01/02/2015 9:18:13 PM      MDM   Final diagnoses:  Chest pain, unspecified chest pain type    Patient is well-appearing at this time, no anginal symptoms currently. Initial EKG and troponin are negative. Discussed with cardiology even his prior coronary history, they recommend medicine admission given his social concerns, hyperglycemia, and negative initial troponin. Tele obs admit.    Ephraim Hamburger, MD 01/02/15 [phone]

## 2015-01-03 ENCOUNTER — Encounter (HOSPITAL_COMMUNITY)
Admission: EM | Disposition: A | Discharge status: Home / Self Care | Payer: Self-pay | Source: Home / Self Care | Attending: Emergency Medicine

## 2015-01-03 ENCOUNTER — Encounter (HOSPITAL_COMMUNITY): Payer: Self-pay | Admitting: Cardiovascular Disease

## 2015-01-03 DIAGNOSIS — I25110 Atherosclerotic heart disease of native coronary artery with unstable angina pectoris: Secondary | ICD-10-CM

## 2015-01-03 DIAGNOSIS — I10 Essential (primary) hypertension: Secondary | ICD-10-CM

## 2015-01-03 DIAGNOSIS — E039 Hypothyroidism, unspecified: Secondary | ICD-10-CM

## 2015-01-03 DIAGNOSIS — I25119 Atherosclerotic heart disease of native coronary artery with unspecified angina pectoris: Secondary | ICD-10-CM

## 2015-01-03 DIAGNOSIS — E785 Hyperlipidemia, unspecified: Secondary | ICD-10-CM

## 2015-01-03 DIAGNOSIS — I200 Unstable angina: Secondary | ICD-10-CM

## 2015-01-03 HISTORY — PX: LEFT HEART CATHETERIZATION WITH CORONARY/GRAFT ANGIOGRAM: SHX5450

## 2015-01-03 LAB — LIPID PANEL
CHOL/HDL RATIO: 5.6 ratio
Cholesterol: 173 mg/dL (ref 0–200)
HDL: 31 mg/dL — AB (ref >39)
LDL CALC: 102 mg/dL — AB (ref 0–99)
Triglycerides: 198 mg/dL — ABNORMAL HIGH (ref <150)
VLDL: 40 mg/dL (ref 0–40)

## 2015-01-03 LAB — CK TOTAL AND CKMB (NOT AT ARMC)
CK TOTAL: 42 U/L (ref 7–232)
CK TOTAL: 45 U/L (ref 7–232)
CK, MB: 0.9 ng/mL (ref 0.3–4.0)
CK, MB: 0.9 ng/mL (ref 0.3–4.0)
CK, MB: 0.9 ng/mL (ref 0.3–4.0)
RELATIVE INDEX: INVALID (ref 0.0–2.5)
RELATIVE INDEX: INVALID (ref 0.0–2.5)
Relative Index: INVALID (ref 0.0–2.5)
Total CK: 37 U/L (ref 7–232)

## 2015-01-03 LAB — TROPONIN I
Troponin I: <0.03 ng/mL (ref <0.031)
Troponin I: <0.03 ng/mL (ref <0.031)

## 2015-01-03 LAB — GLUCOSE, CAPILLARY
GLUCOSE-CAPILLARY: 321 mg/dL — AB (ref 70–99)
GLUCOSE-CAPILLARY: 334 mg/dL — AB (ref 70–99)
Glucose-Capillary: 149 mg/dL — ABNORMAL HIGH (ref 70–99)
Glucose-Capillary: 189 mg/dL — ABNORMAL HIGH (ref 70–99)
Glucose-Capillary: 171 mg/dL — ABNORMAL HIGH (ref 70–99)

## 2015-01-03 LAB — PROTIME-INR
INR: 1.11 (ref 0.00–1.49)
Prothrombin Time: 14.5 seconds (ref 11.6–15.2)

## 2015-01-03 SURGERY — LEFT HEART CATHETERIZATION WITH CORONARY/GRAFT ANGIOGRAM
Anesthesia: LOCAL

## 2015-01-03 MED ORDER — ASPIRIN 81 MG PO CHEW
81.0000 mg | CHEWABLE_TABLET | ORAL | Status: AC
  Administered 2015-01-03: 81 mg via ORAL
  Filled 2015-01-03: qty 1

## 2015-01-03 MED ORDER — FENTANYL CITRATE 0.05 MG/ML IJ SOLN
INTRAMUSCULAR | Status: AC
  Filled 2015-01-03: qty 2

## 2015-01-03 MED ORDER — METFORMIN HCL 500 MG PO TABS: 1000.0000 mg | ORAL_TABLET | Freq: Two times a day (BID) | ORAL | Status: DC

## 2015-01-03 MED ORDER — REGADENOSON 0.4 MG/5ML IV SOLN
0.4000 mg | Freq: Once | INTRAVENOUS | Status: DC
  Filled 2015-01-03: qty 5

## 2015-01-03 MED ORDER — MIDAZOLAM HCL 2 MG/2ML IJ SOLN
INTRAMUSCULAR | Status: AC
  Filled 2015-01-03: qty 2

## 2015-01-03 MED ORDER — SODIUM CHLORIDE 0.9 % IV SOLN
INTRAVENOUS | Status: AC
  Administered 2015-01-03 (×2): via INTRAVENOUS

## 2015-01-03 MED ORDER — ASPIRIN 81 MG PO CHEW
81.0000 mg | CHEWABLE_TABLET | Freq: Every day | ORAL | Status: DC
  Administered 2015-01-04: 81 mg via ORAL
  Filled 2015-01-03: qty 1

## 2015-01-03 MED ORDER — SODIUM CHLORIDE 0.9 % IV SOLN: 1.0000 mL/kg/h | INTRAVENOUS | Status: DC

## 2015-01-03 MED ORDER — ONDANSETRON HCL 4 MG/2ML IJ SOLN: 4.0000 mg | Freq: Four times a day (QID) | INTRAMUSCULAR | Status: DC | PRN

## 2015-01-03 MED ORDER — HEPARIN (PORCINE) IN NACL 100-0.45 UNIT/ML-% IJ SOLN
1100.0000 [IU]/h | INTRAMUSCULAR | Status: DC
  Administered 2015-01-03: 1100 [IU]/h via INTRAVENOUS
  Filled 2015-01-03: qty 250

## 2015-01-03 MED ORDER — ACETAMINOPHEN 325 MG PO TABS: 650.0000 mg | ORAL_TABLET | ORAL | Status: DC | PRN

## 2015-01-03 MED ORDER — SODIUM CHLORIDE 0.9 % IJ SOLN: 3.0000 mL | Freq: Two times a day (BID) | INTRAMUSCULAR | Status: DC

## 2015-01-03 MED ORDER — HEPARIN BOLUS VIA INFUSION
2000.0000 [IU] | Freq: Once | INTRAVENOUS | Status: AC
  Administered 2015-01-03: 2000 [IU] via INTRAVENOUS
  Filled 2015-01-03: qty 2000

## 2015-01-03 MED ORDER — HEPARIN (PORCINE) IN NACL 2-0.9 UNIT/ML-% IJ SOLN
INTRAMUSCULAR | Status: AC
  Filled 2015-01-03: qty 1500

## 2015-01-03 MED ORDER — ATROPINE SULFATE 0.1 MG/ML IJ SOLN
INTRAMUSCULAR | Status: AC
  Filled 2015-01-03: qty 10

## 2015-01-03 MED ORDER — SODIUM CHLORIDE 0.9 % IJ SOLN: 3.0000 mL | INTRAMUSCULAR | Status: DC | PRN

## 2015-01-03 MED ORDER — SODIUM CHLORIDE 0.9 % IV SOLN: 250.0000 mL | INTRAVENOUS | Status: DC | PRN

## 2015-01-03 MED ORDER — NITROGLYCERIN 1 MG/10 ML FOR IR/CATH LAB
INTRA_ARTERIAL | Status: AC
  Filled 2015-01-03: qty 10

## 2015-01-03 MED ORDER — DOPAMINE-DEXTROSE 3.2-5 MG/ML-% IV SOLN
INTRAVENOUS | Status: AC
  Filled 2015-01-03: qty 250

## 2015-01-03 MED ORDER — LIDOCAINE HCL (PF) 1 % IJ SOLN
INTRAMUSCULAR | Status: AC
  Filled 2015-01-03: qty 30

## 2015-01-03 NOTE — Progress Notes (Signed)
Ur COMPLETED. 

## 2015-01-03 NOTE — Progress Notes (Signed)
ANTICOAGULATION CONSULT NOTE - Initial Consult  Pharmacy Consult for heparin Indication: chest pain/ACS  Allergies  Allergen Reactions  . Morphine     REACTION: hallucinations  . Shellfish-Derived Products Itching    Patient Measurements: Height: 5\' 9"  (175.3 cm) Weight: 178 lb 6.4 oz (80.922 kg) IBW/kg (Calculated) : 70.7   Vital Signs: Temp: 97.9 F (36.6 C) (01/29 0500) Temp Source: Oral (01/29 0500) BP: 133/68 mmHg (01/29 0500) Pulse Rate: 68 (01/29 0500)  Labs:  Recent Labs  01/02/15 2100 01/03/15 0040 01/03/15 0330  HGB 15.0  --   --   HCT 42.6  --   --   PLT 165  --   --   CREATININE 1.14  --   --   CKTOTAL  --  42 45  CKMB  --  0.9 0.9  TROPONINI  --  <0.03 <0.03    Estimated Creatinine Clearance: 63.7 mL/min (by C-G formula based on Cr of 1.14).   Medical History: Past Medical History  Diagnosis Date  . Diabetes mellitus type II, uncontrolled   . OSA (obstructive sleep apnea)   . Hyperlipidemia   . RBBB (right bundle branch block)   . Depression     Bipolar  . Edema   . Hypertension   . Falling episodes     these have occurred in the past and again recurring 2011  . Spondylosis     C5-6, C6-7 MRI 2010  . CAD (coronary artery disease)     A. CABG in 2000,status post cardiac cath in 2006, 2009 ....continued chest pain and SOB despite oral medication adjestments including Ranexa. B. Cath November 2009/ DES  Stent...11/26/2008 to distal  RCA leading to acute marginal.  C. Cath 07/2012 for CP - stable anatomy, med rx.  Marland Kitchen Hx of CABG     2000,  / one median sternotomy suture broken her chest x-ray November, 2010, no clinical significance  . Palpitations     event recorder showed sinus rhythm  . Nephrolithiasis   . Restless leg   . Dizziness   . Shortness of breath     CPX April, 2011, mild functional limitation, no clear pulmonary or cardiac limitation, possible deconditioning and mild chronotropic incompetence( peak heart rate 130)  . Cerebral  ischemia     MRI November, 2010, chronic microvascular ischemia  . Anemia     hemoglobin 7.4, iron deficiency, January, 2011, 2 unit transfusion, endoscopy normal, capsule endoscopy February, 2011 no small bowel abnormalities.   Most likely source gastric erosions, followed by GI  . H/O medication noncompliance     Due to loss of insurance  . Ejection fraction     EF 60%, echo, July 31, 2012  . Diabetes mellitus without complication   . Pain in limb 06/12/2009    Qualifier: Diagnosis of  By: [name]   . Low back pain 06/12/2009    Qualifier: Diagnosis of  By: [name]   . Thyroid disease     Medications:  Prescriptions prior to admission  Medication Sig Dispense Refill Last Dose  . gabapentin (NEURONTIN) 300 MG capsule Take 300 mg by mouth 3 (three) times daily.   01/02/2015 at Unknown time  . insulin aspart (NOVOLOG) 100 UNIT/ML injection Use 10-17 unit 3 times daily before meals 15 mL 2 01/02/2015 at Unknown time  . clotrimazole-betamethasone (LOTRISONE) cream APPLY TO THE AFFECTED TWICE DAILY (Patient not taking: Reported on 01/02/2015) 30 g 0 Taking  . diclofenac sodium (  VOLTAREN) 1 % GEL Apply 2 g topically 3 (three) times daily as needed. For pain to affected area (Patient not taking: Reported on 01/02/2015) 2 Tube 3 Taking  . furosemide (LASIX) 20 MG tablet Take 1 tablet (20 mg total) by mouth daily. (Patient not taking: Reported on 01/02/2015) 90 tablet 1 Taking  . HYDROcodone-acetaminophen (NORCO/VICODIN) 5-325 MG per tablet Take 2 tablets by mouth every 4 (four) hours as needed. (Patient not taking: Reported on 01/02/2015) 20 tablet 0 Taking  . hyoscyamine (LEVSIN, ANASPAZ) 0.125 MG tablet Take 1 tablet (0.125 mg total) by mouth every 8 (eight) hours as needed for cramping. (Patient not taking: Reported on 01/02/2015) 30 tablet 0 Taking  . Insulin Glargine (TOUJEO SOLOSTAR) 300 UNIT/ML SOPN Inject 36 Units into the skin at bedtime. (Patient not taking: Reported on  01/02/2015) 3 pen 2   . isosorbide mononitrate (IMDUR) 120 MG 24 hr tablet Take 1 tablet (120 mg total) by mouth daily. (Patient not taking: Reported on 01/02/2015) 90 tablet 1 Taking  . levothyroxine (SYNTHROID, LEVOTHROID) 75 MCG tablet Take 1 tablet (75 mcg total) by mouth daily. (Patient not taking: Reported on 01/02/2015) 90 tablet 1 Taking  . metFORMIN (GLUCOPHAGE) 1000 MG tablet Take 1 tablet (1,000 mg total) by mouth 2 (two) times daily with a meal. (Patient not taking: Reported on 01/02/2015) 180 tablet 1   . nitroGLYCERIN (NITROSTAT) 0.4 MG SL tablet Place 1 tablet (0.4 mg total) under the tongue every 5 (five) minutes as needed (up to 3 doses). (Patient not taking: Reported on 01/02/2015) 25 tablet 4 Taking  . pantoprazole (PROTONIX) 40 MG tablet Take 1 tablet (40 mg total) by mouth daily. (Patient not taking: Reported on 01/02/2015) 90 tablet 1 Taking  . pramipexole (MIRAPEX) 0.25 MG tablet Take 1 tablet (0.25 mg total) by mouth at bedtime. (Patient not taking: Reported on 01/02/2015) 90 tablet 1 Taking  . sertraline (ZOLOFT) 100 MG tablet Take 1 tablet (100 mg total) by mouth 2 (two) times daily. (Patient not taking: Reported on 01/02/2015) 180 tablet 1 Taking  . simvastatin (ZOCOR) 40 MG tablet Take 1 tablet (40 mg total) by mouth every evening. (Patient not taking: Reported on 01/02/2015) 90 tablet 1 Taking  . zolpidem (AMBIEN) 5 MG tablet Take 1 tablet (5 mg total) by mouth at bedtime as needed for sleep. (Patient not taking: Reported on 01/02/2015) 30 tablet 3 Taking    Assessment: 69 yo man to start heparin for CP.  He is schedule for cath later today.  He received 40 mg sq lovenox this am.   Goal of Therapy:  Heparin level 0.3-0.7 units/ml Monitor platelets by anticoagulation protocol: Yes   Plan:  Heparin drip at 1100 units/hr after a small 2000 unit bolus. Check heparin level later today or f/u after cath  Thanks for allowing pharmacy to be a part of this patient's care.  Excell Seltzer, PharmD Clinical Pharmacist, [phone]  01/03/2015,9:53 AM

## 2015-01-03 NOTE — H&P (View-Only) (Signed)
CARDIOLOGY CONSULT NOTE   Patient ID: [name] [name] MRN: [id number], DOB/AGE: 67-Dec-1947   Admit date: 01/02/2015 Date of Consult: 01/03/2015  Primary Physician: Penni Homans, MD Primary Cardiologist: Dr [name]  Reason for consult:  Chest pain  Problem List  Past Medical History  Diagnosis Date  . Diabetes mellitus type II, uncontrolled   . OSA (obstructive sleep apnea)   . Hyperlipidemia   . RBBB (right bundle branch block)   . Depression     Bipolar  . Edema   . Hypertension   . Falling episodes     these have occurred in the past and again recurring 2011  . Spondylosis     C5-6, C6-7 MRI 2010  . CAD (coronary artery disease)     A. CABG in 2000,status post cardiac cath in 2006, 2009 ....continued chest pain and SOB despite oral medication adjestments including Ranexa. B. Cath November 2009/ DES  Stent...11/26/2008 to distal  RCA leading to acute marginal.  C. Cath 07/2012 for CP - stable anatomy, med rx.  Marland Kitchen Hx of CABG     2000,  / one median sternotomy suture broken her chest x-ray November, 2010, no clinical significance  . Palpitations     event recorder showed sinus rhythm  . Nephrolithiasis   . Restless leg   . Dizziness   . Shortness of breath     CPX April, 2011, mild functional limitation, no clear pulmonary or cardiac limitation, possible deconditioning and mild chronotropic incompetence( peak heart rate 130)  . Cerebral ischemia     MRI November, 2010, chronic microvascular ischemia  . Anemia     hemoglobin 7.4, iron deficiency, January, 2011, 2 unit transfusion, endoscopy normal, capsule endoscopy February, 2011 no small bowel abnormalities.   Most likely source gastric erosions, followed by GI  . H/O medication noncompliance     Due to loss of insurance  . Ejection fraction     EF 60%, echo, July 31, 2012  . Diabetes mellitus without complication   . Pain in limb 06/12/2009    Qualifier: Diagnosis of  By: [name]   . Low back pain  06/12/2009    Qualifier: Diagnosis of  By: [name]   . Thyroid disease     Past Surgical History  Procedure Laterality Date  . Nasal septum surgery      UP3  . Coronary artery bypass graft      2000  . Left heart catheterization with coronary/graft angiogram N/A 08/01/2012    Procedure: LEFT HEART CATHETERIZATION WITH Beatrix Fetters;  Surgeon: [name], MD;  Location[facility] CATH LAB;  Service: Cardiovascular;  Laterality: N/A;     Allergies  Allergies  Allergen Reactions  . Morphine     REACTION: hallucinations  . Shellfish-Derived Products Itching    HPI   69 yo male with hx of HTN, DM2, CAD s/p CABG in 2000, status post cardiac cath in 2006, 2009 ....continued chest pain and SOB despite oral medication adjestments including Ranexa. B. Cath November 2009/ DES  Stent...11/26/2008 to distal  RCA leading to acute marginal.  C. Cath 07/2012 for CP - stable anatomy, recommendations for medical therapy, followed by Dr [name], last seen in 2013.  The patient hasn't seen Dr [name] in 3 years. He has had a stable anginal symptoms since then, until 2 weeks ago when he developed anginal pains with minimal activity on a daily basis - similar in character to pains before CABG  with diaphoresis and radiation to his left arm.As well as generally fatigued.  Denies fever, chills, cough, palp, n/v, diarrhea, brbpr, black stool. CXR negative. EKG=> NSR at 75, RBBB. Pt will be admitted for cp. Pt declines stress testing, "they don't work on him, negative". The patient admits to not taking all his meds in the last couple of months as he fell into a donut hole, but he was taking aspirin daily.  Inpatient Medications  . aspirin EC  325 mg Oral Daily  . enoxaparin (LOVENOX) injection  40 mg Subcutaneous Q24H  . furosemide  20 mg Oral Daily  . gabapentin  300 mg Oral TID  . insulin aspart  0-5 Units Subcutaneous QHS  . insulin aspart  0-9 Units Subcutaneous TID WC  . insulin  glargine  36 Units Subcutaneous QHS  . isosorbide mononitrate  120 mg Oral Daily  . levothyroxine  75 mcg Oral Q breakfast  . metFORMIN  1,000 mg Oral BID WC  . pantoprazole  40 mg Oral Daily  . pneumococcal 23 valent vaccine  0.5 mL Intramuscular Tomorrow-1000  . pramipexole  0.25 mg Oral QHS  . sertraline  100 mg Oral BID  . simvastatin  40 mg Oral QPM  . sodium chloride  3 mL Intravenous Q12H   Family History Family History  Problem Relation Age of Onset  . Cancer Brother     pancreatic cancer  . Diabetes Brother     4   . Stroke Brother   . Diabetes Brother   . Cancer Brother   . Diabetes Brother   . Coronary artery disease Brother   . Diabetes Brother   . Hypothyroidism Brother   . Heart attack      Nephew     Social History History   Social History  . Marital Status: Married    Spouse Name: N/A    Number of Children: N/A  . Years of Education: N/A   Occupational History  . Not on file.   Social History Main Topics  . Smoking status: Never Smoker   . Smokeless tobacco: Never Used  . Alcohol Use: No  . Drug Use: No  . Sexual Activity: Not on file   Other Topics Concern  . Not on file   Social History Narrative     Review of Systems  General:  No chills, fever, night sweats or weight changes.  Cardiovascular:  No chest pain, dyspnea on exertion, edema, orthopnea, palpitations, paroxysmal nocturnal dyspnea. Dermatological: No rash, lesions/masses Respiratory: No cough, dyspnea Urologic: No hematuria, dysuria Abdominal:   No nausea, vomiting, diarrhea, bright red blood per rectum, melena, or hematemesis Neurologic:  No visual changes, wkns, changes in mental status. All other systems reviewed and are otherwise negative except as noted above.  Physical Exam  Blood pressure 133/68, pulse 68, temperature 97.9 F (36.6 C), temperature source Oral, resp. rate 20, height 5\' 9"  (1.753 m), weight 178 lb 6.4 oz (80.922 kg), SpO2 97 %.  General: Pleasant,  NAD Psych: Normal affect. Neuro: Alert and oriented X 3. Moves all extremities spontaneously. HEENT: Normal  Neck: Supple without bruits or JVD. Lungs:  Resp regular and unlabored, CTA. Heart: RRR no s3, s4, or murmurs. Abdomen: Soft, non-tender, non-distended, BS + x 4.  Extremities: No clubbing, cyanosis or edema. DP/PT/Radials 2+ and equal bilaterally.  Labs   Recent Labs  01/03/15 0040 01/03/15 0330  CKTOTAL 42 45  CKMB 0.9 0.9  TROPONINI <0.03 <0.03   Lab Results  Component Value Date   WBC 7.9 01/02/2015   HGB 15.0 01/02/2015   HCT 42.6 01/02/2015   MCV 85.0 01/02/2015   PLT 165 01/02/2015    Recent Labs Lab 01/02/15 2100  NA 136  K 4.0  CL 103  CO2 22  BUN 15  CREATININE 1.14  CALCIUM 9.3  GLUCOSE 360*   Lab Results  Component Value Date   CHOL 173 01/03/2015   HDL 31* 01/03/2015   LDLCALC 102* 01/03/2015   TRIG 198* 01/03/2015   Lab Results  Component Value Date   DDIMER <0.22 08/01/2012   Invalid input(s): POCBNP  Radiology/Studies  Dg Chest 2 View  01/02/2015   CLINICAL DATA:  Chest pain for 2 weeks. Left shoulder pain. History of CABG. Initial encounter.  EXAM: CHEST  2 VIEW  COMPARISON:  10/30/2014 and 07/30/2012.  FINDINGS: The heart size and mediastinal contours are stable status post CABG. The lungs are clear. There is no pleural effusion or pneumothorax. No osseous abnormalities are seen.  IMPRESSION: Stable postoperative chest.  No active cardiopulmonary process.   Electronically Signed   By: Camie Patience [name]   On: 01/02/2015 21:16    Echocardiogram - 07/31/2012  Left ventricle: The cavity size was normal. Wall thickness was normal. Systolic function was normal. The estimated ejection fraction was in the range of 60% to 65%. Wall motion was normal; there were no regional wall motion abnormalities. Doppler parameters are consistent with abnormal left ventricular relaxation (grade 1  diastolic dysfunction). ------------------------------------------------------------ Aortic valve:  Mildly thickened leaflets. Doppler:  No significant regurgitation. ---------------------------------------------------------- Mitral valve:  Mildly thickened leaflets . Doppler: Trivial regurgitation.  Peak gradient: 65mm Hg (D). ------------------------------------------------------------ Left atrium: The atrium was normal in size. ------------------------------------------------------------ Right ventricle: The cavity size was normal. Wall thickness was normal. Systolic function was normal. ------------------------------------------------------------ Tricuspid valve:  Structurally normal valve.  Leaflet separation was normal. Doppler: Transvalvular velocity was within the normal range. Trivial regurgitation.   ECG: SR, RBBB, unchanged from 2013     ASSESSMENT AND PLAN  69 year old male   1. Unstable angina - with daily chest pains, might be due to medication non-compliance (unable to afford sec to donut hole), but symptoms significant. We will schedule a cath for today, he is NPO> - troponins are negative, ECG unchanged from 2013.  - continue asa, start heparin drip, no BB as he is bradycardic at baseline, start ACEI after the cath - check echo foe LVEF and WMA  2. HTN - controlled  3. Hyperlipidemia - restarted simvastatin  Signed, Dorothy Spark, MD, [place] 01/03/2015, 8:01 AM

## 2015-01-03 NOTE — Progress Notes (Signed)
Inpatient Diabetes Program Recommendations  AACE/ADA: New Consensus Statement on Inpatient Glycemic Control (2013)  Target Ranges:  Prepandial:   less than 140 mg/dL      Peak postprandial:   less than 180 mg/dL (1-2 hours)      Critically ill patients:  140 - 180 mg/dL   Reason for assessment: history of diabetes  Diabetes history: Type 2, patient of Dr. Cruzita Lederer, last seen December 12, 2014 Outpatient Diabetes medications: Toujeo 36 units qhs, Novolog 10-17 units tid with meals, Metformin 1000mg  bid Current orders for Inpatient glycemic control: Lantus 36 units qhs, Novolog correction 0-5 units at hs, Novolog correction 0-9 units tid with meals.   Agree with current orders for diabetes control.    Gentry Fitz, RN, BA, MHA, CDE Diabetes Coordinator Inpatient Diabetes Program  [phone] (Team Pager) [phone] [facility]) 01/03/2015 10:26 AM

## 2015-01-03 NOTE — Consult Note (Signed)
CARDIOLOGY CONSULT NOTE   Patient ID: [name] [name] MRN: [id number], DOB/AGE: 67-May-1947   Admit date: 01/02/2015 Date of Consult: 01/03/2015  Primary Physician: Penni Homans, MD Primary Cardiologist: Dr [name]  Reason for consult:  Chest pain  Problem List  Past Medical History  Diagnosis Date  . Diabetes mellitus type II, uncontrolled   . OSA (obstructive sleep apnea)   . Hyperlipidemia   . RBBB (right bundle branch block)   . Depression     Bipolar  . Edema   . Hypertension   . Falling episodes     these have occurred in the past and again recurring 2011  . Spondylosis     C5-6, C6-7 MRI 2010  . CAD (coronary artery disease)     A. CABG in 2000,status post cardiac cath in 2006, 2009 ....continued chest pain and SOB despite oral medication adjestments including Ranexa. B. Cath November 2009/ DES  Stent...11/26/2008 to distal  RCA leading to acute marginal.  C. Cath 07/2012 for CP - stable anatomy, med rx.  Marland Kitchen Hx of CABG     2000,  / one median sternotomy suture broken her chest x-ray November, 2010, no clinical significance  . Palpitations     event recorder showed sinus rhythm  . Nephrolithiasis   . Restless leg   . Dizziness   . Shortness of breath     CPX April, 2011, mild functional limitation, no clear pulmonary or cardiac limitation, possible deconditioning and mild chronotropic incompetence( peak heart rate 130)  . Cerebral ischemia     MRI November, 2010, chronic microvascular ischemia  . Anemia     hemoglobin 7.4, iron deficiency, January, 2011, 2 unit transfusion, endoscopy normal, capsule endoscopy February, 2011 no small bowel abnormalities.   Most likely source gastric erosions, followed by GI  . H/O medication noncompliance     Due to loss of insurance  . Ejection fraction     EF 60%, echo, July 31, 2012  . Diabetes mellitus without complication   . Pain in limb 06/12/2009    Qualifier: Diagnosis of  By: [name]   . Low back pain  06/12/2009    Qualifier: Diagnosis of  By: [name]   . Thyroid disease     Past Surgical History  Procedure Laterality Date  . Nasal septum surgery      UP3  . Coronary artery bypass graft      2000  . Left heart catheterization with coronary/graft angiogram N/A 08/01/2012    Procedure: LEFT HEART CATHETERIZATION WITH Beatrix Fetters;  Surgeon: [name], MD;  Location: [facility] CATH LAB;  Service: Cardiovascular;  Laterality: N/A;     Allergies  Allergies  Allergen Reactions  . Morphine     REACTION: hallucinations  . Shellfish-Derived Products Itching    HPI   69 yo male with hx of HTN, DM2, CAD s/p CABG in 2000, status post cardiac cath in 2006, 2009 ....continued chest pain and SOB despite oral medication adjestments including Ranexa. B. Cath November 2009/ DES  Stent...11/26/2008 to distal  RCA leading to acute marginal.  C. Cath 07/2012 for CP - stable anatomy, recommendations for medical therapy, followed by Dr [name], last seen in 2013.  The patient hasn't seen Dr [name] in 3 years. He has had a stable anginal symptoms since then, until 2 weeks ago when he developed anginal pains with minimal activity on a daily basis - similar in character to pains before CABG  with diaphoresis and radiation to his left arm.As well as generally fatigued.  Denies fever, chills, cough, palp, n/v, diarrhea, brbpr, black stool. CXR negative. EKG=> NSR at 75, RBBB. Pt will be admitted for cp. Pt declines stress testing, "they don't work on him, negative". The patient admits to not taking all his meds in the last couple of months as he fell into a donut hole, but he was taking aspirin daily.  Inpatient Medications  . aspirin EC  325 mg Oral Daily  . enoxaparin (LOVENOX) injection  40 mg Subcutaneous Q24H  . furosemide  20 mg Oral Daily  . gabapentin  300 mg Oral TID  . insulin aspart  0-5 Units Subcutaneous QHS  . insulin aspart  0-9 Units Subcutaneous TID WC  . insulin  glargine  36 Units Subcutaneous QHS  . isosorbide mononitrate  120 mg Oral Daily  . levothyroxine  75 mcg Oral Q breakfast  . metFORMIN  1,000 mg Oral BID WC  . pantoprazole  40 mg Oral Daily  . pneumococcal 23 valent vaccine  0.5 mL Intramuscular Tomorrow-1000  . pramipexole  0.25 mg Oral QHS  . sertraline  100 mg Oral BID  . simvastatin  40 mg Oral QPM  . sodium chloride  3 mL Intravenous Q12H   Family History Family History  Problem Relation Age of Onset  . Cancer Brother     pancreatic cancer  . Diabetes Brother     4   . Stroke Brother   . Diabetes Brother   . Cancer Brother   . Diabetes Brother   . Coronary artery disease Brother   . Diabetes Brother   . Hypothyroidism Brother   . Heart attack      Nephew     Social History History   Social History  . Marital Status: Married    Spouse Name: N/A    Number of Children: N/A  . Years of Education: N/A   Occupational History  . Not on file.   Social History Main Topics  . Smoking status: Never Smoker   . Smokeless tobacco: Never Used  . Alcohol Use: No  . Drug Use: No  . Sexual Activity: Not on file   Other Topics Concern  . Not on file   Social History Narrative     Review of Systems  General:  No chills, fever, night sweats or weight changes.  Cardiovascular:  No chest pain, dyspnea on exertion, edema, orthopnea, palpitations, paroxysmal nocturnal dyspnea. Dermatological: No rash, lesions/masses Respiratory: No cough, dyspnea Urologic: No hematuria, dysuria Abdominal:   No nausea, vomiting, diarrhea, bright red blood per rectum, melena, or hematemesis Neurologic:  No visual changes, wkns, changes in mental status. All other systems reviewed and are otherwise negative except as noted above.  Physical Exam  Blood pressure 133/68, pulse 68, temperature 97.9 F (36.6 C), temperature source Oral, resp. rate 20, height 5\' 9"  (1.753 m), weight 178 lb 6.4 oz (80.922 kg), SpO2 97 %.  General: Pleasant,  NAD Psych: Normal affect. Neuro: Alert and oriented X 3. Moves all extremities spontaneously. HEENT: Normal  Neck: Supple without bruits or JVD. Lungs:  Resp regular and unlabored, CTA. Heart: RRR no s3, s4, or murmurs. Abdomen: Soft, non-tender, non-distended, BS + x 4.  Extremities: No clubbing, cyanosis or edema. DP/PT/Radials 2+ and equal bilaterally.  Labs   Recent Labs  01/03/15 0040 01/03/15 0330  CKTOTAL 42 45  CKMB 0.9 0.9  TROPONINI <0.03 <0.03   Lab Results  Component Value Date   WBC 7.9 01/02/2015   HGB 15.0 01/02/2015   HCT 42.6 01/02/2015   MCV 85.0 01/02/2015   PLT 165 01/02/2015    Recent Labs Lab 01/02/15 2100  NA 136  K 4.0  CL 103  CO2 22  BUN 15  CREATININE 1.14  CALCIUM 9.3  GLUCOSE 360*   Lab Results  Component Value Date   CHOL 173 01/03/2015   HDL 31* 01/03/2015   LDLCALC 102* 01/03/2015   TRIG 198* 01/03/2015   Lab Results  Component Value Date   DDIMER <0.22 08/01/2012   Invalid input(s): POCBNP  Radiology/Studies  Dg Chest 2 View  01/02/2015   CLINICAL DATA:  Chest pain for 2 weeks. Left shoulder pain. History of CABG. Initial encounter.  EXAM: CHEST  2 VIEW  COMPARISON:  10/30/2014 and 07/30/2012.  FINDINGS: The heart size and mediastinal contours are stable status post CABG. The lungs are clear. There is no pleural effusion or pneumothorax. No osseous abnormalities are seen.  IMPRESSION: Stable postoperative chest.  No active cardiopulmonary process.   Electronically Signed   By: Camie Patience [name]   On: 01/02/2015 21:16    Echocardiogram - 07/31/2012  Left ventricle: The cavity size was normal. Wall thickness was normal. Systolic function was normal. The estimated ejection fraction was in the range of 60% to 65%. Wall motion was normal; there were no regional wall motion abnormalities. Doppler parameters are consistent with abnormal left ventricular relaxation (grade 1  diastolic dysfunction). ------------------------------------------------------------ Aortic valve:  Mildly thickened leaflets. Doppler:  No significant regurgitation. ---------------------------------------------------------- Mitral valve:  Mildly thickened leaflets . Doppler: Trivial regurgitation.  Peak gradient: 40mm Hg (D). ------------------------------------------------------------ Left atrium: The atrium was normal in size. ------------------------------------------------------------ Right ventricle: The cavity size was normal. Wall thickness was normal. Systolic function was normal. ------------------------------------------------------------ Tricuspid valve:  Structurally normal valve.  Leaflet separation was normal. Doppler: Transvalvular velocity was within the normal range. Trivial regurgitation.   ECG: SR, RBBB, unchanged from 2013     ASSESSMENT AND PLAN  69 year old male   1. Unstable angina - with daily chest pains, might be due to medication non-compliance (unable to afford sec to donut hole), but symptoms significant. We will schedule a cath for today, he is NPO> - troponins are negative, ECG unchanged from 2013.  - continue asa, start heparin drip, no BB as he is bradycardic at baseline, start ACEI after the cath - check echo foe LVEF and WMA  2. HTN - controlled  3. Hyperlipidemia - restarted simvastatin  Signed, Dorothy Spark, MD, [facility] 01/03/2015, 8:01 AM

## 2015-01-03 NOTE — Interval H&P Note (Signed)
Cath Lab Visit (complete for each Cath Lab visit)  Clinical Evaluation Leading to the Procedure:   ACS: Yes.    Non-ACS:    Anginal Classification: CCS IV  Anti-ischemic medical therapy: Maximal Therapy (2 or more classes of medications)  Non-Invasive Test Results: No non-invasive testing performed  Prior CABG: Previous CABG      History and Physical Interval Note:  01/03/2015 1:00 PM  [name] [name]  has presented today for surgery, with the diagnosis of cp  The various methods of treatment have been discussed with the patient and family. After consideration of risks, benefits and other options for treatment, the patient has consented to  Procedure(s): LEFT HEART CATHETERIZATION WITH CORONARY/GRAFT ANGIOGRAM (N/A) as a surgical intervention .  The patient's history has been reviewed, patient examined, no change in status, stable for surgery.  I have reviewed the patient's chart and labs.  Questions were answered to the patient's satisfaction.     [name]

## 2015-01-03 NOTE — CV Procedure (Signed)
[name] [name] is a 69 y.o. male    [id number] LOCATION:  FACILITY: [facility]  PHYSICIAN: Quay Burow, [name] 1946/04/18   DATE OF PROCEDURE:  01/03/2015  DATE OF DISCHARGE:     CARDIAC CATHETERIZATION     History obtained from chart review. [name] [name] is a 69 year old thin-appearing Caucasian male patient of Dr. Dola Argyle who was admitted to [facility] by Dr. Meda Coffee for unstable angina. He has a history of hypertension, diabetes and prior CAD. He had bypass surgery in 2000, multiple chronic catheterization since. He had a stent placed in his RCA in 2009. Left calf and 2013 showed widely patent grafts and medical therapy was recommended.The patient has not been taking his medications recently because of pain in the "hole". He had chest pain reminiscent of his pre-bypass symptoms and was admitted for evaluation. His enzymes were negative and his EKG showed chronic right bundle branch block. He presents now for diagnostic coronary arteriography.   PROCEDURE DESCRIPTION:   The patient was brought to the second floor [facility] cath lab in the postabsorptive state. He was premedicated with Valium 5 mg by mouth, IV Versed and fentanyl. His right groin was prepped and shaved in usual sterile fashion. Xylocaine 1% was used for local anesthesia. A 5 French sheath was inserted into the right common femoral artery using standard Seldinger technique. The patient was somewhat hypotensive at the beginning of the case with a blood pressure measured intra-arterially of 70/40. He received intravenous fluid resuscitation and low-dose dopamine which resulted in improvement in his blood pressures. 5 French right and left Judkins diagnostic catheters along with a 5 French pigtail catheter was used for selective coronary angiography, selective vein graft angiography and internal mammary artery graft angiography and left ventriculography. A right femoral antrum was also performed. Visipaque dye was  used for the entirety of the case (90 mL measured the patient). Retrograde aortic, left ventricular and pullback pressures were recorded.   HEMODYNAMICS:    AO SYSTOLIC/AO DIASTOLIC: 0000000   LV SYSTOLIC/LV DIASTOLIC: [id number]  ANGIOGRAPHIC RESULTS:   1. Left main; normal  2. LAD; - diffuse 50-70% segmental proximal. There was competitive flow in the diagonal branch. There was also competitive flow in the apical LAD. 3. Left circumflex; . 30-45% proximal. There was competitive flow within a marginal branch graft was patent. There was also a 40-50% distal stenosis in the beginning portion of the posterior lateral branch. 4. Right coronary artery; dominant with patent stent in the midportion 5.LIMA TO LAD; widely patent 6. SVG TO obtuse marginal branch was widely patent     SVG TO diagonal branch was widely patent     SVG TO PDA was widely patent  7. Left ventriculography; RAO left ventriculogram was performed using  25 mL of Visipaque dye at 12 mL/second. The overall LVEF estimated  60 %  Without wall motion abnormalities  IMPRESSION:[name] [name] has widely patent grafts with normal left ventricular function. The stent in the RCA is patent as well. There are no culprit lesions. Continued medical therapy will be recommended. A right femoral arteriogram was performed and the groin was hemostatically sealed with a Vascade hemostasis device successfully. The patient left the lab in stable condition. His dopamine was turned off at the end of the case. He'll be observed overnight and discharged home in the morning. He will follow-up with Dr. [name].  [name] MD, [facility] 01/03/2015 1:53 PM

## 2015-01-03 NOTE — Progress Notes (Signed)
[facility] PROGRESS NOTE  [name] [name] [id number] DOB: [birth year] DOA: 01/02/2015  PCP: Penni Homans, MD  Brief HPI: 69 year old Caucasian male with a past medical history of coronary artery disease, status post CABG in past along with stent placement in 2013. He also has a history of diabetes. He presented with on and off chest pain over the past 2 weeks. He was admitted for further management.  Past medical history:  Past Medical History  Diagnosis Date  . Diabetes mellitus type II, uncontrolled   . OSA (obstructive sleep apnea)   . Hyperlipidemia   . RBBB (right bundle branch block)   . Depression     Bipolar  . Edema   . Hypertension   . Falling episodes     these have occurred in the past and again recurring 2011  . Spondylosis     C5-6, C6-7 MRI 2010  . CAD (coronary artery disease)     A. CABG in 2000,status post cardiac cath in 2006, 2009 ....continued chest pain and SOB despite oral medication adjestments including Ranexa. B. Cath November 2009/ DES  Stent...11/26/2008 to distal  RCA leading to acute marginal.  C. Cath 07/2012 for CP - stable anatomy, med rx.  Marland Kitchen Hx of CABG     2000,  / one median sternotomy suture broken her chest x-ray November, 2010, no clinical significance  . Palpitations     event recorder showed sinus rhythm  . Nephrolithiasis   . Restless leg   . Dizziness   . Shortness of breath     CPX April, 2011, mild functional limitation, no clear pulmonary or cardiac limitation, possible deconditioning and mild chronotropic incompetence( peak heart rate 130)  . Cerebral ischemia     MRI November, 2010, chronic microvascular ischemia  . Anemia     hemoglobin 7.4, iron deficiency, January, 2011, 2 unit transfusion, endoscopy normal, capsule endoscopy February, 2011 no small bowel abnormalities.   Most likely source gastric erosions, followed by GI  . H/O medication noncompliance     Due to loss of insurance  . Ejection fraction     EF  60%, echo, July 31, 2012  . Diabetes mellitus without complication   . Pain in limb 06/12/2009    Qualifier: Diagnosis of  By: [name]   . Low back pain 06/12/2009    Qualifier: Diagnosis of  By: [name]   . Thyroid disease     Consultants: Cardiology  Procedures: Cardiac catheterization 1/29 pending  Antibiotics: None  Subjective: Patient complains of on and off chest pain which he describes as a burning sensation in the retrosternal area. Denies any difficulty breathing. No lightheadedness currently. No nausea, vomiting. No palpitations.  Objective: Vital Signs  Filed Vitals:   01/02/15 2230 01/02/15 2245 01/03/15 0000 01/03/15 0500  BP: 148/68  147/66 133/68  Pulse:  64 58 68  Temp:   97.9 F (36.6 C) 97.9 F (36.6 C)  TempSrc:   Oral Oral  Resp:  21 20 20   Height:   5\' 9"  (1.753 m)   Weight:   80.922 kg (178 lb 6.4 oz)   SpO2:  96% 97% 97%    Intake/Output Summary (Last 24 hours) at 01/03/15 1318 Last data filed at 01/03/15 0040  Gross per 24 hour  Intake    243 ml  Output      0 ml  Net    243 ml   Filed Weights   01/02/15 2049  01/03/15 0000  Weight: 84.369 kg (186 lb) 80.922 kg (178 lb 6.4 oz)    General appearance: alert, cooperative, appears stated age and no distress Head: Normocephalic, without obvious abnormality, atraumatic Resp: clear to auscultation bilaterally Cardio: regular rate and rhythm, S1, S2 normal, no murmur, click, rub or gallop GI: soft, non-tender; bowel sounds normal; no masses,  no organomegaly Extremities: extremities normal, atraumatic, no cyanosis or edema Neurologic: No focal deficits.  Lab Results:  Basic Metabolic Panel:  Recent Labs Lab 01/02/15 2100  NA 136  K 4.0  CL 103  CO2 22  GLUCOSE 360*  BUN 15  CREATININE 1.14  CALCIUM 9.3   CBC:  Recent Labs Lab 01/02/15 2100  WBC 7.9  HGB 15.0  HCT 42.6  MCV 85.0  PLT 165   Cardiac Enzymes:  Recent Labs Lab 01/03/15 0040  01/03/15 0330 01/03/15 1105  CKTOTAL 42 45 37  CKMB 0.9 0.9 0.9  TROPONINI <0.03 <0.03 <0.03   CBG:  Recent Labs Lab 01/03/15 0031 01/03/15 0744 01/03/15 1153 01/03/15 1238  GLUCAP 334* 149* 189* 171*    Studies/Results: Dg Chest 2 View  01/02/2015   CLINICAL DATA:  Chest pain for 2 weeks. Left shoulder pain. History of CABG. Initial encounter.  EXAM: CHEST  2 VIEW  COMPARISON:  10/30/2014 and 07/30/2012.  FINDINGS: The heart size and mediastinal contours are stable status post CABG. The lungs are clear. There is no pleural effusion or pneumothorax. No osseous abnormalities are seen.  IMPRESSION: Stable postoperative chest.  No active cardiopulmonary process.   Electronically Signed   By: Camie Patience [name]   On: 01/02/2015 21:16    Medications:  Scheduled: . [MAR Hold] aspirin EC  325 mg Oral Daily  . [MAR Hold] furosemide  20 mg Oral Daily  . [MAR Hold] gabapentin  300 mg Oral TID  . [MAR Hold] insulin aspart  0-5 Units Subcutaneous QHS  . [MAR Hold] insulin aspart  0-9 Units Subcutaneous TID WC  . [MAR Hold] insulin glargine  36 Units Subcutaneous QHS  . [MAR Hold] isosorbide mononitrate  120 mg Oral Daily  . [MAR Hold] levothyroxine  75 mcg Oral Q breakfast  . [MAR Hold] metFORMIN  1,000 mg Oral BID WC  . [MAR Hold] pantoprazole  40 mg Oral Daily  . [MAR Hold] pramipexole  0.25 mg Oral QHS  . regadenoson  0.4 mg Intravenous Once  . [MAR Hold] sertraline  100 mg Oral BID  . [MAR Hold] simvastatin  40 mg Oral QPM  . [MAR Hold] sodium chloride  3 mL Intravenous Q12H  . sodium chloride  3 mL Intravenous Q12H   Continuous: . [START ON 01/04/2015] sodium chloride    . heparin 1,100 Units/hr (01/03/15 1040)   [id number] chloride, [MAR Hold] acetaminophen **OR** [MAR Hold] acetaminophen, [MAR Hold] HYDROcodone-acetaminophen, [MAR Hold] hyoscyamine, [MAR Hold] nitroGLYCERIN, sodium chloride, [MAR Hold] zolpidem  Assessment/Plan:  Principal Problem:   Chest pain Active  Problems:   Hypothyroidism   CAD (coronary artery disease)   Diabetes mellitus type II, uncontrolled    Chest pain in the setting of known coronary artery disease EKG doesn't show any significant new changes compared to previous. However, his symptoms are concerning. He is scheduled for a cardiac catheterization today. He is on intravenous heparin which will be continued. Continue aspirin. He's not on Plavix at home. He does report acid reflux at home for which he is on Protonix once daily.  History of diabetes mellitus type 2 Continue  Lantus. Sliding scale coverage. Monitor CBGs.  History of hypothyroidism Continue with levothyroxine  History of obstructive sleep apnea CPAP Overnight  DVT Prophylaxis: On IV heparin    Code Status: Full code  Family Communication: Discussed with the patient  Disposition Plan: Not ready for discharge.    LOS: 1 day   [facility] Pager [phone] 01/03/2015, 1:18 PM  If 7PM-7AM, please contact night-coverage at [email or web address], password [facility]

## 2015-01-04 DIAGNOSIS — I2510 Atherosclerotic heart disease of native coronary artery without angina pectoris: Secondary | ICD-10-CM

## 2015-01-04 LAB — BASIC METABOLIC PANEL
Anion gap: 3 — ABNORMAL LOW (ref 5–15)
BUN: 12 mg/dL (ref 6–23)
CALCIUM: 8.8 mg/dL (ref 8.4–10.5)
CHLORIDE: 107 mmol/L (ref 96–112)
CO2: 27 mmol/L (ref 19–32)
Creatinine, Ser: 1 mg/dL (ref 0.50–1.35)
GFR calc non Af Amer: 76 mL/min — ABNORMAL LOW (ref >90)
GFR, EST AFRICAN AMERICAN: 89 mL/min — AB (ref >90)
Glucose, Bld: 256 mg/dL — ABNORMAL HIGH (ref 70–99)
POTASSIUM: 3.9 mmol/L (ref 3.5–5.1)
Sodium: 137 mmol/L (ref 135–145)

## 2015-01-04 LAB — HEMOGLOBIN A1C
Hgb A1c MFr Bld: 10.8 % — ABNORMAL HIGH (ref 4.8–5.6)
MEAN PLASMA GLUCOSE: 263 mg/dL

## 2015-01-04 LAB — GLUCOSE, CAPILLARY
GLUCOSE-CAPILLARY: 167 mg/dL — AB (ref 70–99)
GLUCOSE-CAPILLARY: 168 mg/dL — AB (ref 70–99)

## 2015-01-04 MED ORDER — METFORMIN HCL 1000 MG PO TABS: 1000.0000 mg | ORAL_TABLET | Freq: Two times a day (BID) | ORAL | Status: DC

## 2015-01-04 MED ORDER — ASPIRIN 325 MG PO TBEC: 325.0000 mg | DELAYED_RELEASE_TABLET | Freq: Every day | ORAL | Status: DC

## 2015-01-04 MED ORDER — PANTOPRAZOLE SODIUM 40 MG PO TBEC: DELAYED_RELEASE_TABLET | ORAL | Status: DC

## 2015-01-04 NOTE — Progress Notes (Signed)
Subjective:  He remains stable overnight.  No chest pain or shortness of breath.  Catheterization site is fine.  Objective:  Vital Signs in the last 24 hours: BP 124/65 mmHg  Pulse 63  Temp(Src) 97.9 F (36.6 C) (Oral)  Resp 18  Ht 5\' 9"  (1.753 m)  Wt 79.742 kg (175 lb 12.8 oz)  BMI 25.95 kg/m2  SpO2 97%  Physical Exam: Pleasant male in no acute distress Lungs:  Clear Cardiac:  Regular rhythm, normal S1 and S2, no S3 Extremities: Cath site clean and dry without ecchymoses   Intake/Output from previous day: 01/29 0701 - 01/30 0700 In: 1656.3 [P.O.:340; I.V.:1316.3] Out: 1000 [Urine:1000]  Weight Filed Weights   01/02/15 2049 01/03/15 0000 01/04/15 0500  Weight: 84.369 kg (186 lb) 80.922 kg (178 lb 6.4 oz) 79.742 kg (175 lb 12.8 oz)    Lab Results: Basic Metabolic Panel:  Recent Labs  01/02/15 2100 01/04/15 0236  NA 136 137  K 4.0 3.9  CL 103 107  CO2 22 27  GLUCOSE 360* 256*  BUN 15 12  CREATININE 1.14 1.00   CBC:  Recent Labs  01/02/15 2100  WBC 7.9  HGB 15.0  HCT 42.6  MCV 85.0  PLT 165   Cardiac Enzymes:  Recent Labs  01/03/15 0040 01/03/15 0330 01/03/15 1105  CKTOTAL 42 45 37  CKMB 0.9 0.9 0.9  TROPONINI <0.03 <0.03 <0.03    Telemetry: Sinus rhythm.    Assessment/Plan:  1.  Coronary artery disease with previous bypass grafting with patent grafts and patent stent.  Recommendations:  Made be discharged on medical therapy.  Should follow-up with Dr. [name] in the next one to 2 weeks following discharge.     Kerry Hough  MD [facility]  01/04/2015, 10:33 AM

## 2015-01-04 NOTE — Discharge Summary (Signed)
[facility]  Physician Discharge Summary   Patient ID: [name] [name] MRN: [id number] DOB/AGE: 1946-04-18 69 y.o.  Admit date: 01/02/2015 Discharge date: 01/04/2015  PCP: Penni Homans, MD  DISCHARGE DIAGNOSES:  Principal Problem:   Chest pain Active Problems:   Hypothyroidism   CAD (coronary artery disease)   Diabetes mellitus type II, uncontrolled   RECOMMENDATIONS FOR OUTPATIENT FOLLOW UP: 1. Cardiology to arrange a patient follow-up  DISCHARGE CONDITION: fair  Diet recommendation: Heart healthy  Filed Weights   01/02/15 2049 01/03/15 0000 01/04/15 0500  Weight: 84.369 kg (186 lb) 80.922 kg (178 lb 6.4 oz) 79.742 kg (175 lb 12.8 oz)    INITIAL HISTORY: 69 year old Caucasian male with a past medical history of coronary artery disease, status post CABG in past along with stent placement in 2013. He also has a history of diabetes. He presented with on and off chest pain over the past 2 weeks. He was admitted for further management.  Consultations:  Cardiology  Procedures: Cardiac catheterization 1/29: See report  HOSPITAL COURSE:   Chest pain in the setting of known coronary artery disease EKG did not show any significant new changes compared to previous. However, his symptoms where concerning. Patient was seen by cardiology. Cardiac catheterization was done yesterday which did not show any new occlusions. Medical management was recommended. Continue with aspirin and his other home medications. He is having some financial difficulties with getting medicines and has not been taking them for the past few weeks. This could be the reason for his angina. Case management will be consulted to assist with this issue. Other possibilities include the worsening acid reflux. He has been asked to take his PPI twice a day for now.   History of diabetes mellitus type 2 Continue Lantus.   History of hypothyroidism Continue with levothyroxine  History of obstructive  sleep apnea CPAP Overnight  Patient denies any further chest pain or shortness of breath. He can be discharged home today. Discussed with cardiology.   PERTINENT LABS:  The results of significant diagnostics from this hospitalization (including imaging, microbiology, ancillary and laboratory) are listed below for reference.    Labs: Basic Metabolic Panel:  Recent Labs Lab 01/02/15 2100 01/04/15 0236  NA 136 137  K 4.0 3.9  CL 103 107  CO2 22 27  GLUCOSE 360* 256*  BUN 15 12  CREATININE 1.14 1.00  CALCIUM 9.3 8.8   CBC:  Recent Labs Lab 01/02/15 2100  WBC 7.9  HGB 15.0  HCT 42.6  MCV 85.0  PLT 165   Cardiac Enzymes:  Recent Labs Lab 01/03/15 0040 01/03/15 0330 01/03/15 1105  CKTOTAL 42 45 37  CKMB 0.9 0.9 0.9  TROPONINI <0.03 <0.03 <0.03   CBG:  Recent Labs Lab 01/03/15 0744 01/03/15 1153 01/03/15 1238 01/03/15 2107 01/04/15 0745  GLUCAP 149* 189* 171* 321* 167*     IMAGING STUDIES Dg Chest 2 View  01/02/2015   CLINICAL DATA:  Chest pain for 2 weeks. Left shoulder pain. History of CABG. Initial encounter.  EXAM: CHEST  2 VIEW  COMPARISON:  10/30/2014 and 07/30/2012.  FINDINGS: The heart size and mediastinal contours are stable status post CABG. The lungs are clear. There is no pleural effusion or pneumothorax. No osseous abnormalities are seen.  IMPRESSION: Stable postoperative chest.  No active cardiopulmonary process.   Electronically Signed   By: Camie Patience [name]   On: 01/02/2015 21:16    DISCHARGE EXAMINATION: Filed Vitals:   01/03/15 1800 01/03/15 2015 01/03/15  2200 01/04/15 0500  BP: 112/52 115/55 125/56 124/65  Pulse: 64 68  63  Temp:  97.9 F (36.6 C)  97.9 F (36.6 C)  TempSrc:  Oral  Oral  Resp:  20  18  Height:      Weight:    79.742 kg (175 lb 12.8 oz)  SpO2: 97% 98%  97%   General appearance: alert, cooperative, appears stated age and no distress Head: Normocephalic, without obvious abnormality, atraumatic Resp: clear to  auscultation bilaterally Cardio: regular rate and rhythm, S1, S2 normal, no murmur, click, rub or gallop GI: soft, non-tender; bowel sounds normal; no masses,  no organomegaly Extremities: extremities normal, atraumatic, no cyanosis or edema  DISPOSITION: Home  Discharge Instructions    Call MD for:  difficulty breathing, headache or visual disturbances    Complete by:  As directed      Call MD for:  extreme fatigue    Complete by:  As directed      Call MD for:  persistant dizziness or light-headedness    Complete by:  As directed      Call MD for:  persistant nausea and vomiting    Complete by:  As directed      Call MD for:  severe uncontrolled pain    Complete by:  As directed      Diet - low sodium heart healthy    Complete by:  As directed      Increase activity slowly    Complete by:  As directed            ALLERGIES:  Allergies  Allergen Reactions  . Morphine     REACTION: hallucinations  . Shellfish-Derived Products Itching     Current Discharge Medication List    START taking these medications   Details  aspirin EC 325 MG EC tablet Take 1 tablet (325 mg total) by mouth daily. Qty: 30 tablet, Refills: 2      CONTINUE these medications which have CHANGED   Details  metFORMIN (GLUCOPHAGE) 1000 MG tablet Take 1 tablet (1,000 mg total) by mouth 2 (two) times daily with a meal. Please resume on 01/05/15 Qty: 180 tablet, Refills: 1    pantoprazole (PROTONIX) 40 MG tablet Take twice daily for 2 weeks to see if it helps the symptoms. Qty: 90 tablet, Refills: 1      CONTINUE these medications which have NOT CHANGED   Details  gabapentin (NEURONTIN) 300 MG capsule Take 300 mg by mouth 3 (three) times daily.    insulin aspart (NOVOLOG) 100 UNIT/ML injection Use 10-17 unit 3 times daily before meals Qty: 15 mL, Refills: 2    clotrimazole-betamethasone (LOTRISONE) cream APPLY TO THE AFFECTED TWICE DAILY Qty: 30 g, Refills: 0    diclofenac sodium (VOLTAREN) 1 %  GEL Apply 2 g topically 3 (three) times daily as needed. For pain to affected area Qty: 2 Tube, Refills: 3    furosemide (LASIX) 20 MG tablet Take 1 tablet (20 mg total) by mouth daily. Qty: 90 tablet, Refills: 1    HYDROcodone-acetaminophen (NORCO/VICODIN) 5-325 MG per tablet Take 2 tablets by mouth every 4 (four) hours as needed. Qty: 20 tablet, Refills: 0    hyoscyamine (LEVSIN, ANASPAZ) 0.125 MG tablet Take 1 tablet (0.125 mg total) by mouth every 8 (eight) hours as needed for cramping. Qty: 30 tablet, Refills: 0    Insulin Glargine (TOUJEO SOLOSTAR) 300 UNIT/ML SOPN Inject 36 Units into the skin at bedtime. Qty: 3 pen, Refills:  2    isosorbide mononitrate (IMDUR) 120 MG 24 hr tablet Take 1 tablet (120 mg total) by mouth daily. Qty: 90 tablet, Refills: 1    levothyroxine (SYNTHROID, LEVOTHROID) 75 MCG tablet Take 1 tablet (75 mcg total) by mouth daily. Qty: 90 tablet, Refills: 1    nitroGLYCERIN (NITROSTAT) 0.4 MG SL tablet Place 1 tablet (0.4 mg total) under the tongue every 5 (five) minutes as needed (up to 3 doses). Qty: 25 tablet, Refills: 4    pramipexole (MIRAPEX) 0.25 MG tablet Take 1 tablet (0.25 mg total) by mouth at bedtime. Qty: 90 tablet, Refills: 1    sertraline (ZOLOFT) 100 MG tablet Take 1 tablet (100 mg total) by mouth 2 (two) times daily. Qty: 180 tablet, Refills: 1    simvastatin (ZOCOR) 40 MG tablet Take 1 tablet (40 mg total) by mouth every evening. Qty: 90 tablet, Refills: 1    zolpidem (AMBIEN) 5 MG tablet Take 1 tablet (5 mg total) by mouth at bedtime as needed for sleep. Qty: 30 tablet, Refills: 3       Follow-up Information    Follow up with Penni Homans, MD. Schedule an appointment as soon as possible for a visit in 1 week.   Specialty:  Family Medicine   Why:  post hospitalization follow up   Contact information:   [address] [place] [address] [phone]       Follow up with Dola Argyle, MD. Schedule an  appointment as soon as possible for a visit in 2 weeks.   Specialty:  Cardiology   Why:  post hospitalization follow up   Contact information:   [address] [id number] [phone]       TOTAL DISCHARGE TIME: 35 mins  [facility] Pager [phone]  01/04/2015, 11:19 AM

## 2015-01-04 NOTE — Discharge Instructions (Signed)

## 2015-01-05 NOTE — Care Management Note (Signed)
    Page 1 of 1   01/05/2015     5:02:31 PM CARE MANAGEMENT NOTE 01/05/2015  Patient:  [name] [name],[name] [name]   Account Number:  [id number]  Date Initiated:  01/04/2015  Documentation initiated by:  [facility]  Subjective/Objective Assessment:     Action/Plan:   Anticipated DC Date:  01/04/2015   Anticipated DC Plan:           Choice offered to / List presented to:             Status of service:   Medicare Important Message given?   (If response is "NO", the following Medicare IM given date fields will be blank) Date Medicare IM given:   Medicare IM given by:   Date Additional Medicare IM given:   Additional Medicare IM given by:    Discharge Disposition:  HOME/SELF CARE  Per UR Regulation:    If discussed at Long Length of Stay Meetings, dates discussed:    Comments:  01/04/15 CM received call from RN requesting financial asst with medicaitons.  CM explained pt has insurance and does not have the [facility] program available to him. No other CM needs were communicated.  Mariane Masters, BSn, CM [phone].

## 2015-01-06 ENCOUNTER — Telehealth: Payer: Self-pay

## 2015-01-06 ENCOUNTER — Telehealth: Payer: Self-pay | Admitting: *Deleted

## 2015-01-06 NOTE — Telephone Encounter (Signed)
I had a meeting cancel for tomorrow. Could see him at 7:45 tomorrow if he is willing

## 2015-01-06 NOTE — Telephone Encounter (Signed)
Caller name: Torrie Mayers Relation to pt: daughter Call back number: [phone] Pharmacy:  Reason for call: Pt was admitted to [facility] for chest pain 01/02/15 and discharged 01/05/15.  Instructions were to follow up with PCP within 1 week.

## 2015-01-06 NOTE — Telephone Encounter (Signed)
Admit date: 01/02/2015 Discharge date: 01/04/2015  Reason for admission: Chest pain  Pt states that he is feeling much better since discharge.  No complaints of chest pain or shortness of breath.  Attempted to schedule 1 week follow up, however, with Dr. Frederik Pear schedule being full, he refused to see another provider.  Pt has not already scheduled his appointment with Dr. [name].  Therefore, pt was strongly advised to schedule appointment with Dr. [name] as soon as possible.  Pt stated understanding and said he would comply.

## 2015-01-06 NOTE — Telephone Encounter (Signed)
Noted. Thanks.

## 2015-01-07 ENCOUNTER — Ambulatory Visit (INDEPENDENT_AMBULATORY_CARE_PROVIDER_SITE_OTHER): Payer: Medicare PPO | Admitting: Family Medicine

## 2015-01-07 ENCOUNTER — Encounter: Payer: Self-pay | Admitting: Family Medicine

## 2015-01-07 DIAGNOSIS — E785 Hyperlipidemia, unspecified: Secondary | ICD-10-CM

## 2015-01-07 DIAGNOSIS — K219 Gastro-esophageal reflux disease without esophagitis: Secondary | ICD-10-CM

## 2015-01-07 DIAGNOSIS — M25561 Pain in right knee: Secondary | ICD-10-CM

## 2015-01-07 DIAGNOSIS — I10 Essential (primary) hypertension: Secondary | ICD-10-CM

## 2015-01-07 DIAGNOSIS — IMO0002 Reserved for concepts with insufficient information to code with codable children: Secondary | ICD-10-CM

## 2015-01-07 DIAGNOSIS — E1165 Type 2 diabetes mellitus with hyperglycemia: Secondary | ICD-10-CM

## 2015-01-07 DIAGNOSIS — M179 Osteoarthritis of knee, unspecified: Secondary | ICD-10-CM | POA: Insufficient documentation

## 2015-01-07 DIAGNOSIS — H8110 Benign paroxysmal vertigo, unspecified ear: Secondary | ICD-10-CM

## 2015-01-07 DIAGNOSIS — I2583 Coronary atherosclerosis due to lipid rich plaque: Secondary | ICD-10-CM

## 2015-01-07 DIAGNOSIS — I2510 Atherosclerotic heart disease of native coronary artery without angina pectoris: Secondary | ICD-10-CM

## 2015-01-07 DIAGNOSIS — M25562 Pain in left knee: Secondary | ICD-10-CM

## 2015-01-07 MED ORDER — PANTOPRAZOLE SODIUM 40 MG PO TBEC: DELAYED_RELEASE_TABLET | ORAL | Status: DC

## 2015-01-07 NOTE — Progress Notes (Signed)
Pre visit review using our clinic review tool, if applicable. No additional management support is needed unless otherwise documented below in the visit note. 

## 2015-01-07 NOTE — Progress Notes (Signed)
[name] [name]  [id number] 23-Apr-1946 01/07/2015      Progress Note-Follow Up  Subjective  Chief Complaint  Chief Complaint  Patient presents with  . Hospitalization Follow-up    "feeling good overall, just weak", dizziness and near syncope episodes before hospital and during hospital visit, none afterwards    HPI  Patient is a 69 y.o. male in today for routine medical care. Patient is in today for hospital follow-up. Was admitted to: Hospital with chest pain and underwent an extensive cardiac workup including a cardiac catheterization which was negative for any concerning lesions. Since coming home from the hospital he's had no further chest pain. He was started on pantoprazole in the hospital for heartburn which he acknowledges he has frequently. He acknowledges also he never started the new insulin prescribed by endocrinology but instead stayed on low doses of his Lantus. Today his only complaint is of feeling lightheaded upon arising quickly. No syncope no symptoms at any other time no palpitations, shortness of breath, GU complaints are noted at today's visit except for persistent right knee pain. Right knee in the past has been evaluated with x-ray while he was living in [facility]. He was told joint was bone on bone. Has gotten relief from cortisone injections in the past.  Past Medical History  Diagnosis Date  . Diabetes mellitus type II, uncontrolled   . OSA (obstructive sleep apnea)   . Hyperlipidemia   . RBBB (right bundle branch block)   . Depression     Bipolar  . Edema   . Hypertension   . Falling episodes     these have occurred in the past and again recurring 2011  . Spondylosis     C5-6, C6-7 MRI 2010  . CAD (coronary artery disease)     A. CABG in 2000,status post cardiac cath in 2006, 2009 ....continued chest pain and SOB despite oral medication adjestments including Ranexa. B. Cath November 2009/ DES  Stent...11/26/2008 to distal  RCA leading to acute  marginal.  C. Cath 07/2012 for CP - stable anatomy, med rx.  Marland Kitchen Hx of CABG     2000,  / one median sternotomy suture broken her chest x-ray November, 2010, no clinical significance  . Palpitations     event recorder showed sinus rhythm  . Nephrolithiasis   . Restless leg   . Dizziness   . Shortness of breath     CPX April, 2011, mild functional limitation, no clear pulmonary or cardiac limitation, possible deconditioning and mild chronotropic incompetence( peak heart rate 130)  . Cerebral ischemia     MRI November, 2010, chronic microvascular ischemia  . Anemia     hemoglobin 7.4, iron deficiency, January, 2011, 2 unit transfusion, endoscopy normal, capsule endoscopy February, 2011 no small bowel abnormalities.   Most likely source gastric erosions, followed by GI  . H/O medication noncompliance     Due to loss of insurance  . Ejection fraction     EF 60%, echo, July 31, 2012  . Diabetes mellitus without complication   . Pain in limb 06/12/2009    Qualifier: Diagnosis of  By: [name]   . Low back pain 06/12/2009    Qualifier: Diagnosis of  By: [name]   . Thyroid disease     Past Surgical History  Procedure Laterality Date  . Nasal septum surgery      UP3  . Coronary artery bypass graft      2000  .  Left heart catheterization with coronary/graft angiogram N/A 08/01/2012    Procedure: LEFT HEART CATHETERIZATION WITH Beatrix Fetters;  Surgeon: [name], MD;  Location: [facility] CATH LAB;  Service: Cardiovascular;  Laterality: N/A;  . Left heart catheterization with coronary/graft angiogram N/A 01/03/2015    Procedure: LEFT HEART CATHETERIZATION WITH Beatrix Fetters;  Surgeon: [name], MD;  Location: [facility] CATH LAB;  Service: Cardiovascular;  Laterality: N/A;    Family History  Problem Relation Age of Onset  . Cancer Brother     pancreatic cancer  . Diabetes Brother     4   . Stroke Brother   . Diabetes Brother   . Cancer Brother   .  Diabetes Brother   . Coronary artery disease Brother   . Diabetes Brother   . Hypothyroidism Brother   . Heart attack      Nephew    History   Social History  . Marital Status: Married    Spouse Name: N/A    Number of Children: N/A  . Years of Education: N/A   Occupational History  . Not on file.   Social History Main Topics  . Smoking status: Never Smoker   . Smokeless tobacco: Never Used  . Alcohol Use: No  . Drug Use: No  . Sexual Activity: Not on file   Other Topics Concern  . Not on file   Social History Narrative    Current Outpatient Prescriptions on File Prior to Visit  Medication Sig Dispense Refill  . aspirin EC 325 MG EC tablet Take 1 tablet (325 mg total) by mouth daily. 30 tablet 2  . clotrimazole-betamethasone (LOTRISONE) cream APPLY TO THE AFFECTED TWICE DAILY 30 g 0  . diclofenac sodium (VOLTAREN) 1 % GEL Apply 2 g topically 3 (three) times daily as needed. For pain to affected area 2 Tube 3  . furosemide (LASIX) 20 MG tablet Take 1 tablet (20 mg total) by mouth daily. 90 tablet 1  . gabapentin (NEURONTIN) 300 MG capsule Take 300 mg by mouth 3 (three) times daily.    Marland Kitchen HYDROcodone-acetaminophen (NORCO/VICODIN) 5-325 MG per tablet Take 2 tablets by mouth every 4 (four) hours as needed. 20 tablet 0  . hyoscyamine (LEVSIN, ANASPAZ) 0.125 MG tablet Take 1 tablet (0.125 mg total) by mouth every 8 (eight) hours as needed for cramping. 30 tablet 0  . insulin aspart (NOVOLOG) 100 UNIT/ML injection Use 10-17 unit 3 times daily before meals 15 mL 2  . Insulin Glargine (TOUJEO SOLOSTAR) 300 UNIT/ML SOPN Inject 36 Units into the skin at bedtime. 3 pen 2  . isosorbide mononitrate (IMDUR) 120 MG 24 hr tablet Take 1 tablet (120 mg total) by mouth daily. 90 tablet 1  . levothyroxine (SYNTHROID, LEVOTHROID) 75 MCG tablet Take 1 tablet (75 mcg total) by mouth daily. 90 tablet 1  . metFORMIN (GLUCOPHAGE) 1000 MG tablet Take 1 tablet (1,000 mg total) by mouth 2 (two) times  daily with a meal. Please resume on 01/05/15 180 tablet 1  . nitroGLYCERIN (NITROSTAT) 0.4 MG SL tablet Place 1 tablet (0.4 mg total) under the tongue every 5 (five) minutes as needed (up to 3 doses). 25 tablet 4  . pantoprazole (PROTONIX) 40 MG tablet Take twice daily for 2 weeks to see if it helps the symptoms. 90 tablet 1  . pramipexole (MIRAPEX) 0.25 MG tablet Take 1 tablet (0.25 mg total) by mouth at bedtime. 90 tablet 1  . sertraline (ZOLOFT) 100 MG tablet Take 1 tablet (100  mg total) by mouth 2 (two) times daily. 180 tablet 1  . simvastatin (ZOCOR) 40 MG tablet Take 1 tablet (40 mg total) by mouth every evening. 90 tablet 1  . zolpidem (AMBIEN) 5 MG tablet Take 1 tablet (5 mg total) by mouth at bedtime as needed for sleep. 30 tablet 3   No current facility-administered medications on file prior to visit.    Allergies  Allergen Reactions  . Morphine     REACTION: hallucinations  . Shellfish-Derived Products Itching    Review of Systems  Review of Systems  Constitutional: Negative for fever and malaise/fatigue.  HENT: Negative for congestion.   Eyes: Negative for discharge.  Respiratory: Negative for shortness of breath.   Cardiovascular: Positive for chest pain. Negative for palpitations and leg swelling.  Gastrointestinal: Negative for nausea, abdominal pain and diarrhea.  Genitourinary: Negative for dysuria.  Musculoskeletal: Negative for falls.  Skin: Negative for rash.  Neurological: Negative for loss of consciousness and headaches.  Endo/Heme/Allergies: Negative for polydipsia.  Psychiatric/Behavioral: Negative for depression and suicidal ideas. The patient is not nervous/anxious and does not have insomnia.     Objective  There were no vitals taken for this visit.  Physical Exam  Physical Exam  Constitutional: He is oriented to person, place, and time and well-developed, well-nourished, and in no distress. No distress.  HENT:  Head: Normocephalic and atraumatic.    Eyes: Conjunctivae are normal.  Neck: Neck supple. No thyromegaly present.  Cardiovascular: Normal rate, regular rhythm and normal heart sounds.   No murmur heard. Pulmonary/Chest: Effort normal and breath sounds normal. No respiratory distress.  Abdominal: He exhibits no distension and no mass. There is no tenderness.  Musculoskeletal: He exhibits no edema.  Neurological: He is alert and oriented to person, place, and time.  Skin: Skin is warm.  Psychiatric: Memory, affect and judgment normal.    Lab Results  Component Value Date   TSH 1.464 11/22/2014   Lab Results  Component Value Date   WBC 7.9 01/02/2015   HGB 15.0 01/02/2015   HCT 42.6 01/02/2015   MCV 85.0 01/02/2015   PLT 165 01/02/2015   Lab Results  Component Value Date   CREATININE 1.00 01/04/2015   BUN 12 01/04/2015   NA 137 01/04/2015   K 3.9 01/04/2015   CL 107 01/04/2015   CO2 27 01/04/2015   Lab Results  Component Value Date   ALT 17 11/22/2014   AST 16 11/22/2014   ALKPHOS 78 11/22/2014   BILITOT 1.3* 11/22/2014   Lab Results  Component Value Date   CHOL 173 01/03/2015   Lab Results  Component Value Date   HDL 31* 01/03/2015   Lab Results  Component Value Date   LDLCALC 102* 01/03/2015   Lab Results  Component Value Date   TRIG 198* 01/03/2015   Lab Results  Component Value Date   CHOLHDL 5.6 01/03/2015     Assessment & Plan  Hypertension Well controlled, no changes to meds. Encouraged heart healthy diet such as the DASH diet and exercise as tolerated.    GERD Avoid offending foods, start probiotics. Do not eat large meals in late evening and consider raising head of bed. Started on Pantoprazole in hospital but sent home without rx, given paper prescription to take to pharmacy today. No further chest pain since starting Pantoprazole   CAD (coronary artery disease) Doing much better since leaving hospital. Cardiac cath did not show any cardiac causes for CP and he is no longer  having symptoms   BENIGN POSITIONAL VERTIGO Describes being light headed upon arising. Suspect hypotension with arising too quickly discussed need to improve hydration and arise more slowly. Notify us if no improvement or worsens, follow up in 6 weeks or as needed   Diabetes mellitus type II, uncontrolled Patient was unable to afford the Tuojeo and stayed on his Lantus to stretch his money. He is given a sample of the Tuojeo to try and has follow up appt with endocrinology. Has noted an increase in the cost of his Novolog but has stayed on that. May need to consider switch to Humalog when follows up with endocrinology next week.   Hyperlipidemia Tolerating statin, encouraged heart healthy diet, avoid trans fats, minimize simple carbs and saturated fats. Increase exercise as tolerated   Right knee pain Offered referral today for what is described by patient as end stage disease and daily pain. He is managing the pain and declines referral at this time. Will call if he would like referral prior to next visit.

## 2015-01-07 NOTE — Assessment & Plan Note (Signed)
Describes being light headed upon arising. Suspect hypotension with arising too quickly discussed need to improve hydration and arise more slowly. Notify us if no improvement or worsens, follow up in 6 weeks or as needed

## 2015-01-07 NOTE — Assessment & Plan Note (Signed)
Offered referral today for what is described by patient as end stage disease and daily pain. He is managing the pain and declines referral at this time. Will call if he would like referral prior to next visit.

## 2015-01-07 NOTE — Patient Instructions (Signed)

## 2015-01-07 NOTE — Assessment & Plan Note (Signed)
Well controlled, no changes to meds. Encouraged heart healthy diet such as the DASH diet and exercise as tolerated.  °

## 2015-01-07 NOTE — Assessment & Plan Note (Signed)
Tolerating statin, encouraged heart healthy diet, avoid trans fats, minimize simple carbs and saturated fats. Increase exercise as tolerated 

## 2015-01-07 NOTE — Assessment & Plan Note (Signed)
Doing much better since leaving hospital. Cardiac cath did not show any cardiac causes for CP and he is no longer having symptoms

## 2015-01-07 NOTE — Assessment & Plan Note (Signed)
Patient was unable to afford the [facility] and stayed on his Lantus to stretch his money. He is given a sample of the Tuojeo to try and has follow up appt with endocrinology. Has noted an increase in the cost of his Novolog but has stayed on that. May need to consider switch to Humalog when follows up with endocrinology next week.

## 2015-01-07 NOTE — Telephone Encounter (Signed)
Pt was scheduled and saw Dr. Charlett Blake today (01/07/15).

## 2015-01-07 NOTE — Assessment & Plan Note (Addendum)
Avoid offending foods, start probiotics. Do not eat large meals in late evening and consider raising head of bed. Started on Pantoprazole in hospital but sent home without rx, given paper prescription to take to pharmacy today. No further chest pain since starting Pantoprazole

## 2015-01-09 ENCOUNTER — Ambulatory Visit: Payer: Medicare PPO | Admitting: Family Medicine

## 2015-01-13 ENCOUNTER — Ambulatory Visit: Payer: 59 | Admitting: Internal Medicine

## 2015-01-23 ENCOUNTER — Encounter: Payer: Self-pay | Admitting: Cardiology

## 2015-01-23 ENCOUNTER — Ambulatory Visit (INDEPENDENT_AMBULATORY_CARE_PROVIDER_SITE_OTHER): Payer: Medicare PPO | Admitting: Cardiology

## 2015-01-23 VITALS — BP 114/70 | HR 66 | Ht 69.0 in | Wt 179.2 lb

## 2015-01-23 DIAGNOSIS — R42 Dizziness and giddiness: Secondary | ICD-10-CM

## 2015-01-23 DIAGNOSIS — R002 Palpitations: Secondary | ICD-10-CM

## 2015-01-23 DIAGNOSIS — I10 Essential (primary) hypertension: Secondary | ICD-10-CM

## 2015-01-23 DIAGNOSIS — I25810 Atherosclerosis of coronary artery bypass graft(s) without angina pectoris: Secondary | ICD-10-CM

## 2015-01-23 NOTE — Assessment & Plan Note (Signed)
Blood pressure control. No change in therapy.  As part of today's evaluation I spent greater than 25 minutes with his total care. We had an extensive discussion about his chest pain history. We also talked about his cardiac catheterization. We reviewed multiple aspects about his care. He also asked me other questions related to his wife's care,(she is my patient.)

## 2015-01-23 NOTE — Assessment & Plan Note (Signed)
Fortunately his coronary disease is stable. His LIMA is patent of the LAD. Vein grafts are patent to the OM, diagonal, PDA. The stent in his RCA is patent. EF is 60%. Over the years it has been difficult to assess his chest pain. He obviously has significant disease. On the other hand there have been times when we have not been able to fully understand his chest discomfort. No change in therapy at this time. As part of today's evaluation I did carefully review all aspects of his hospitalization.

## 2015-01-23 NOTE — Assessment & Plan Note (Signed)
He is not having any significant dizziness at this time. No further workup.

## 2015-01-23 NOTE — Progress Notes (Signed)
Cardiology Office Note   Date:  01/23/2015   ID:  [name] [name], DOB 1946-05-07, MRN [id number]  PCP:  Penni Homans, MD  Cardiologist:  Dola Argyle, MD   Chief Complaint  Patient presents with  . Appointment    Follow-up post hospitalization for coronary artery disease.      History of Present Illness: [name] [name] is a 69 y.o. male who presents today to follow-up coronary artery disease. I have followed him in the past. I know the patient and his wife very well from the medical viewpoint. I had seen him last in the office in October, 2013. [place] and his wife have moved back. He was hospitalized recently with chest discomfort. Decision was made to proceed with cardiac catheterization. This was done and it showed that the coronaries were quite stable. There was no intervention needed. He had normal left ventricular function. His cath was done from the right femoral. He tells me the decision had to do with the fact that he had been advised elsewhere that his coronaries could not be assessed fully in his case post CABG from the arm. He is not having any significant recurrent chest pain.    Past Medical History  Diagnosis Date  . Diabetes mellitus type II, uncontrolled   . OSA (obstructive sleep apnea)   . Hyperlipidemia   . RBBB (right bundle branch block)   . Depression     Bipolar  . Edema   . Hypertension   . Falling episodes     these have occurred in the past and again recurring 2011  . Spondylosis     C5-6, C6-7 MRI 2010  . CAD (coronary artery disease)     A. CABG in 2000,status post cardiac cath in 2006, 2009 ....continued chest pain and SOB despite oral medication adjestments including Ranexa. B. Cath November 2009/ DES  Stent...11/26/2008 to distal  RCA leading to acute marginal.  C. Cath 07/2012 for CP - stable anatomy, med rx.  Marland Kitchen Hx of CABG     2000,  / one median sternotomy suture broken her chest x-ray November, 2010,  no clinical significance  . Palpitations     event recorder showed sinus rhythm  . Nephrolithiasis   . Restless leg   . Dizziness   . Shortness of breath     CPX April, 2011, mild functional limitation, no clear pulmonary or cardiac limitation, possible deconditioning and mild chronotropic incompetence( peak heart rate 130)  . Cerebral ischemia     MRI November, 2010, chronic microvascular ischemia  . Anemia     hemoglobin 7.4, iron deficiency, January, 2011, 2 unit transfusion, endoscopy normal, capsule endoscopy February, 2011 no small bowel abnormalities.   Most likely source gastric erosions, followed by GI  . H/O medication noncompliance     Due to loss of insurance  . Ejection fraction     EF 60%, echo, July 31, 2012  . Diabetes mellitus without complication   . Pain in limb 06/12/2009    Qualifier: Diagnosis of  By: [name]   . Low back pain 06/12/2009    Qualifier: Diagnosis of  By: [name]   . Thyroid disease   . Right knee pain 01/07/2015    Past Surgical History  Procedure Laterality Date  . Nasal septum surgery      UP3  . Coronary artery bypass graft      2000  .  Left heart catheterization with coronary/graft angiogram N/A 08/01/2012    Procedure: LEFT HEART CATHETERIZATION WITH Beatrix Fetters;  Surgeon: [name], MD;  Location: [facility] CATH LAB;  Service: Cardiovascular;  Laterality: N/A;  . Left heart catheterization with coronary/graft angiogram N/A 01/03/2015    Procedure: LEFT HEART CATHETERIZATION WITH Beatrix Fetters;  Surgeon: [name], MD;  Location: [facility] CATH LAB;  Service: Cardiovascular;  Laterality: N/A;    Patient Active Problem List   Diagnosis Date Noted  . CAD (coronary artery disease)     Priority: High  . Anemia     Priority: High  . Edema     Priority: High  . Falling episodes     Priority: High  . Hx of CABG     Priority: High  . Palpitations     Priority: High  . Shortness of breath      Priority: High  . DIZZINESS 01/17/2009    Priority: High  . Right knee pain 01/07/2015  . Chest pain 01/02/2015  . Abdominal pain, unspecified site 03/06/2013  . Right shoulder pain 10/24/2012  . Eczema 10/24/2012  . Bilateral groin pain 09/21/2012  . Ejection fraction   . Chest pain 10/20/2011  . Diabetes mellitus type II, uncontrolled 08/31/2011  . OSA (obstructive sleep apnea)   . Hyperlipidemia   . Hypertension   . Insomnia 04/26/2011  . FATIGUE 12/10/2010  . Hypothyroidism 06/26/2010  . RESTLESS LEG SYNDROME 09/19/2009  . BENIGN POSITIONAL VERTIGO 09/19/2009  . DEPRESSIVE DISORDER NOT ELSEWHERE CLASSIFIED 07/09/2009  . RBBB 07/09/2009  . Low back pain 06/12/2009  . SCIATICA 01/17/2009  . GERD 01/09/2009  . TUBULOVILLOUS ADENOMA, COLON, HX OF 01/08/2009  . NEPHROLITHIASIS, HX OF 12/20/2007      Current Outpatient Prescriptions  Medication Sig Dispense Refill  . aspirin EC 325 MG EC tablet Take 1 tablet (325 mg total) by mouth daily. 30 tablet 2  . clotrimazole-betamethasone (LOTRISONE) cream APPLY TO THE AFFECTED TWICE DAILY 30 g 0  . diclofenac sodium (VOLTAREN) 1 % GEL Apply 2 g topically 3 (three) times daily as needed. For pain to affected area 2 Tube 3  . furosemide (LASIX) 20 MG tablet Take 1 tablet (20 mg total) by mouth daily. 90 tablet 1  . gabapentin (NEURONTIN) 300 MG capsule Take 300 mg by mouth 3 (three) times daily.    Marland Kitchen HYDROcodone-acetaminophen (NORCO/VICODIN) 5-325 MG per tablet Take 2 tablets by mouth every 4 (four) hours as needed. 20 tablet 0  . hyoscyamine (LEVSIN, ANASPAZ) 0.125 MG tablet Take 1 tablet (0.125 mg total) by mouth every 8 (eight) hours as needed for cramping. 30 tablet 0  . insulin aspart (NOVOLOG) 100 UNIT/ML injection Use 10-17 unit 3 times daily before meals 15 mL 2  . Insulin Glargine (TOUJEO SOLOSTAR) 300 UNIT/ML SOPN Inject 36 Units into the skin at bedtime. 3 pen 2  . isosorbide mononitrate (IMDUR) 120 MG 24 hr tablet Take 1  tablet (120 mg total) by mouth daily. 90 tablet 1  . levothyroxine (SYNTHROID, LEVOTHROID) 75 MCG tablet Take 1 tablet (75 mcg total) by mouth daily. 90 tablet 1  . metFORMIN (GLUCOPHAGE) 1000 MG tablet Take 1 tablet (1,000 mg total) by mouth 2 (two) times daily with a meal. Please resume on 01/05/15 180 tablet 1  . nitroGLYCERIN (NITROSTAT) 0.4 MG SL tablet Place 1 tablet (0.4 mg total) under the tongue every 5 (five) minutes as needed (up to 3 doses). 25 tablet 4  . pantoprazole (PROTONIX)  40 MG tablet Take twice daily for 2 weeks to see if it helps the symptoms. 60 tablet 1  . pramipexole (MIRAPEX) 0.25 MG tablet Take 1 tablet (0.25 mg total) by mouth at bedtime. 90 tablet 1  . sertraline (ZOLOFT) 100 MG tablet Take 1 tablet (100 mg total) by mouth 2 (two) times daily. 180 tablet 1  . simvastatin (ZOCOR) 40 MG tablet Take 1 tablet (40 mg total) by mouth every evening. 90 tablet 1  . zolpidem (AMBIEN) 5 MG tablet Take 1 tablet (5 mg total) by mouth at bedtime as needed for sleep. 30 tablet 3   No current facility-administered medications for this visit.    Allergies:   Morphine and Shellfish-derived products    Social History:  The patient  reports that he has never smoked. He has never used smokeless tobacco. He reports that he does not drink alcohol or use illicit drugs.   Family History:  The patient's family history includes Cancer in his brother and brother; Coronary artery disease in his brother; Diabetes in his brother, brother, brother, and brother; Heart attack in an other family member; Hypothyroidism in his brother; Stroke in his brother.    ROS:  Please see the history of present illness.   Patient denies fever, chills, headache, sweats, rash, change in vision, change in hearing, chest pain, cough, nausea or vomiting, urinary symptoms. He does have some chronic exertional shortness of breath. All other systems are reviewed and are negative.     PHYSICAL EXAM: VS:  BP 114/70 mmHg   Pulse 66  Ht 5\' 9"  (1.753 m)  Wt 179 lb 3.2 oz (81.285 kg)  BMI 26.45 kg/m2 , Patient is oriented to person time and place. Affect is normal. Head is atraumatic. Sclerae and conjunctivae are normal. There is no jugulovenous distention. Lungs are clear. Respiratory effort is not labored. Cardiac exam reveals an S1 and S2. Abdomen is soft. There is no peripheral edema. There are no musculoskeletal deformities. There are no skin rashes. Neurologic is grossly intact.  EKG:  EKG was done. No charge was placed because the patient had recently had EKG in the hospital. I did review the study. It shows his old right bundle branch block.   Recent Labs: 11/22/2014: ALT 17; TSH 1.464 01/02/2015: B Natriuretic Peptide 15.2; Hemoglobin 15.0; Platelets 165 01/04/2015: BUN 12; Creatinine 1.00; Potassium 3.9; Sodium 137    Lipid Panel    Component Value Date/Time   CHOL 173 01/03/2015 0040   TRIG 198* 01/03/2015 0040   HDL 31* 01/03/2015 0040   CHOLHDL 5.6 01/03/2015 0040   VLDL 40 01/03/2015 0040   LDLCALC 102* 01/03/2015 0040   LDLDIRECT 46.5 09/21/2012 1658      Wt Readings from Last 3 Encounters:  01/23/15 179 lb 3.2 oz (81.285 kg)  01/04/15 175 lb 12.8 oz (79.742 kg)  12/12/14 181 lb 9.6 oz (82.373 kg)      Current medicines are reviewed. Patient understands his medicines.       ASSESSMENT AND PLAN:

## 2015-01-23 NOTE — Patient Instructions (Signed)
Your physician recommends that you continue on your current medications as directed. Please refer to the Current Medication list given to you today.  Your physician recommends that you schedule a follow-up appointment in: 3 months  

## 2015-01-23 NOTE — Assessment & Plan Note (Signed)
He is not having any significant palpitations currently. No further workup.

## 2015-01-27 ENCOUNTER — Institutional Professional Consult (permissible substitution): Payer: Medicare PPO | Admitting: Pulmonary Disease

## 2015-01-31 ENCOUNTER — Other Ambulatory Visit: Payer: Self-pay | Admitting: Family Medicine

## 2015-01-31 MED ORDER — PANTOPRAZOLE SODIUM 40 MG PO TBEC: DELAYED_RELEASE_TABLET | ORAL | Status: DC

## 2015-02-12 ENCOUNTER — Emergency Department (HOSPITAL_COMMUNITY): Payer: Medicare PPO

## 2015-02-12 ENCOUNTER — Emergency Department (HOSPITAL_COMMUNITY)
Admission: EM | Admit: 2015-02-12 | Discharge: 2015-02-12 | Disposition: A | Discharge status: Home / Self Care | Payer: Medicare PPO | Attending: Emergency Medicine | Admitting: Emergency Medicine

## 2015-02-12 ENCOUNTER — Encounter (HOSPITAL_COMMUNITY): Payer: Self-pay | Admitting: *Deleted

## 2015-02-12 DIAGNOSIS — F319 Bipolar disorder, unspecified: Secondary | ICD-10-CM | POA: Diagnosis not present

## 2015-02-12 DIAGNOSIS — Z951 Presence of aortocoronary bypass graft: Secondary | ICD-10-CM | POA: Diagnosis not present

## 2015-02-12 DIAGNOSIS — Z7982 Long term (current) use of aspirin: Secondary | ICD-10-CM | POA: Insufficient documentation

## 2015-02-12 DIAGNOSIS — W228XXA Striking against or struck by other objects, initial encounter: Secondary | ICD-10-CM | POA: Diagnosis not present

## 2015-02-12 DIAGNOSIS — Z9181 History of falling: Secondary | ICD-10-CM | POA: Diagnosis not present

## 2015-02-12 DIAGNOSIS — S0990XA Unspecified injury of head, initial encounter: Secondary | ICD-10-CM | POA: Diagnosis present

## 2015-02-12 DIAGNOSIS — E785 Hyperlipidemia, unspecified: Secondary | ICD-10-CM | POA: Diagnosis not present

## 2015-02-12 DIAGNOSIS — S199XXA Unspecified injury of neck, initial encounter: Secondary | ICD-10-CM | POA: Insufficient documentation

## 2015-02-12 DIAGNOSIS — Z79899 Other long term (current) drug therapy: Secondary | ICD-10-CM | POA: Insufficient documentation

## 2015-02-12 DIAGNOSIS — Z9889 Other specified postprocedural states: Secondary | ICD-10-CM | POA: Diagnosis not present

## 2015-02-12 DIAGNOSIS — Y9289 Other specified places as the place of occurrence of the external cause: Secondary | ICD-10-CM | POA: Insufficient documentation

## 2015-02-12 DIAGNOSIS — I10 Essential (primary) hypertension: Secondary | ICD-10-CM | POA: Insufficient documentation

## 2015-02-12 DIAGNOSIS — Z87442 Personal history of urinary calculi: Secondary | ICD-10-CM | POA: Diagnosis not present

## 2015-02-12 DIAGNOSIS — I2510 Atherosclerotic heart disease of native coronary artery without angina pectoris: Secondary | ICD-10-CM | POA: Diagnosis not present

## 2015-02-12 DIAGNOSIS — S060X0A Concussion without loss of consciousness, initial encounter: Secondary | ICD-10-CM | POA: Diagnosis not present

## 2015-02-12 DIAGNOSIS — Y998 Other external cause status: Secondary | ICD-10-CM | POA: Insufficient documentation

## 2015-02-12 DIAGNOSIS — Z8669 Personal history of other diseases of the nervous system and sense organs: Secondary | ICD-10-CM | POA: Insufficient documentation

## 2015-02-12 DIAGNOSIS — Z791 Long term (current) use of non-steroidal anti-inflammatories (NSAID): Secondary | ICD-10-CM | POA: Diagnosis not present

## 2015-02-12 DIAGNOSIS — Z794 Long term (current) use of insulin: Secondary | ICD-10-CM | POA: Insufficient documentation

## 2015-02-12 DIAGNOSIS — Z862 Personal history of diseases of the blood and blood-forming organs and certain disorders involving the immune mechanism: Secondary | ICD-10-CM | POA: Insufficient documentation

## 2015-02-12 DIAGNOSIS — M542 Cervicalgia: Secondary | ICD-10-CM

## 2015-02-12 DIAGNOSIS — E119 Type 2 diabetes mellitus without complications: Secondary | ICD-10-CM | POA: Diagnosis not present

## 2015-02-12 DIAGNOSIS — E079 Disorder of thyroid, unspecified: Secondary | ICD-10-CM | POA: Diagnosis not present

## 2015-02-12 DIAGNOSIS — Y9389 Activity, other specified: Secondary | ICD-10-CM | POA: Diagnosis not present

## 2015-02-12 LAB — BASIC METABOLIC PANEL
Anion gap: 8 (ref 5–15)
BUN: 16 mg/dL (ref 6–23)
CALCIUM: 9.3 mg/dL (ref 8.4–10.5)
CHLORIDE: 103 mmol/L (ref 96–112)
CO2: 28 mmol/L (ref 19–32)
CREATININE: 0.91 mg/dL (ref 0.50–1.35)
GFR, EST NON AFRICAN AMERICAN: 86 mL/min — AB (ref >90)
GLUCOSE: 84 mg/dL (ref 70–99)
Potassium: 3.6 mmol/L (ref 3.5–5.1)
SODIUM: 139 mmol/L (ref 135–145)

## 2015-02-12 LAB — CBC WITH DIFFERENTIAL/PLATELET
BASOS PCT: 0 % (ref 0–1)
Basophils Absolute: 0 10*3/uL (ref 0.0–0.1)
EOS PCT: 3 % (ref 0–5)
Eosinophils Absolute: 0.2 10*3/uL (ref 0.0–0.7)
HCT: 39.5 % (ref 39.0–52.0)
Hemoglobin: 14 g/dL (ref 13.0–17.0)
LYMPHS ABS: 2.8 10*3/uL (ref 0.7–4.0)
Lymphocytes Relative: 36 % (ref 12–46)
MCH: 30.5 pg (ref 26.0–34.0)
MCHC: 35.4 g/dL (ref 30.0–36.0)
MCV: 86.1 fL (ref 78.0–100.0)
Monocytes Absolute: 0.6 10*3/uL (ref 0.1–1.0)
Monocytes Relative: 8 % (ref 3–12)
NEUTROS ABS: 4.3 10*3/uL (ref 1.7–7.7)
NEUTROS PCT: 53 % (ref 43–77)
Platelets: 193 10*3/uL (ref 150–400)
RBC: 4.59 MIL/uL (ref 4.22–5.81)
RDW: 13.9 % (ref 11.5–15.5)
WBC: 7.9 10*3/uL (ref 4.0–10.5)

## 2015-02-12 MED ORDER — HYDROCODONE-ACETAMINOPHEN 5-325 MG PO TABS
1.0000 | ORAL_TABLET | Freq: Once | ORAL | Status: AC
  Administered 2015-02-12: 1 via ORAL
  Filled 2015-02-12: qty 1

## 2015-02-12 MED ORDER — HYDROCODONE-ACETAMINOPHEN 5-325 MG PO TABS: 1.0000 | ORAL_TABLET | Freq: Four times a day (QID) | ORAL | Status: DC | PRN

## 2015-02-12 MED ORDER — ONDANSETRON 4 MG PO TBDP
4.0000 mg | ORAL_TABLET | Freq: Once | ORAL | Status: AC
  Administered 2015-02-12: 4 mg via ORAL
  Filled 2015-02-12: qty 1

## 2015-02-12 NOTE — ED Notes (Signed)
MD at bedside. 

## 2015-02-12 NOTE — ED Notes (Signed)
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 Denies loc. Pt having pain to back of head since Saturday, increases when he lays head back on seat. Also feeling off balance since Saturday. Denies n/v.

## 2015-02-12 NOTE — ED Notes (Signed)
Patient transported to CT 
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
## 2015-02-12 NOTE — Discharge Instructions (Signed)
Concussion A concussion is a brain injury. It is caused by:  A hit to the head.  A quick and sudden movement (jolt) of the head or neck. A concussion is usually not life threatening. Even so, it can cause serious problems. If you had a concussion before, you may have concussion-like problems after a hit to your head. HOME CARE General Instructions  Follow your doctor's directions carefully.  Take medicines only as told by your doctor.  Only take medicines your doctor says are safe.  Do not drink alcohol until your doctor says it is okay. Alcohol and some drugs can slow down healing. They can also put you at risk for further injury.  If you are having trouble remembering things, write them down.  Try to do one thing at a time if you get distracted easily. For example, do not watch TV while making dinner.  Talk to your family members or close friends when making important decisions.  Follow up with your doctor as told.  Watch your symptoms. Tell others to do the same. Serious problems can sometimes happen after a concussion. Older adults are more likely to have these problems.  Tell your teachers, school nurse, school counselor, coach, Product/process development scientist, or work Freight forwarder about your concussion. Tell them about what you can or cannot do. They should watch to see if:  It gets even harder for you to pay attention or concentrate.  It gets even harder for you to remember things or learn new things.  You need more time than normal to finish things.  You become annoyed (irritable) more than before.  You are not able to deal with stress as well.  You have more problems than before.  Rest. Make sure you:  Get plenty of sleep at night.  Go to sleep early.  Go to bed at the same time every day. Try to wake up at the same time.  Rest during the day.  Take naps when you feel tired.  Limit activities where you have to think a lot or concentrate. These include:  Doing  homework.  Doing work related to a job.  Watching TV.  Using the computer. Returning To Your Regular Activities Return to your normal activities slowly, not all at once. You must give your body and brain enough time to heal.   Do not play sports or do other athletic activities until your doctor says it is okay.  Ask your doctor when you can drive, ride a bicycle, or work other vehicles or machines. Never do these things if you feel dizzy.  Ask your doctor about when you can return to work or school. Preventing Another Concussion It is very important to avoid another brain injury, especially before you have healed. In rare cases, another injury can lead to permanent brain damage, brain swelling, or death. The risk of this is greatest during the first 7-10 days after your injury. Avoid injuries by:   Wearing a seat belt when riding in a car.  Not drinking too much alcohol.  Avoiding activities that could lead to a second concussion (such as contact sports).  Wearing a helmet when doing activities like:  Biking.  Skiing.  Skateboarding.  Skating.  Making your home safer by:  Removing things from the floor or stairways that could make you trip.  Using grab bars in bathrooms and handrails by stairs.  Placing non-slip mats on floors and in bathtubs.  Improve lighting in dark areas. GET HELP IF:  It  gets even harder for you to pay attention or concentrate.  It gets even harder for you to remember things or learn new things.  You need more time than normal to finish things.  You become annoyed (irritable) more than before.  You are not able to deal with stress as well.  You have more problems than before.  You have problems keeping your balance.  You are not able to react quickly when you should. Get help if you have any of these problems for more than 2 weeks:   Lasting (chronic) headaches.  Dizziness or trouble balancing.  Feeling sick to your stomach  (nausea).  Seeing (vision) problems.  Being affected by noises or light more than normal.  Feeling sad, low, down in the dumps, blue, gloomy, or empty (depressed).  Mood changes (mood swings).  Feeling of fear or nervousness about what may happen (anxiety).  Feeling annoyed.  Memory problems.  Problems concentrating or paying attention.  Sleep problems.  Feeling tired all the time. GET HELP RIGHT AWAY IF:   You have bad headaches or your headaches get worse.  You have weakness (even if it is in one hand, leg, or part of the face).  You have loss of feeling (numbness).  You feel off balance.  You keep throwing up (vomiting).  You feel tired.  One black center of your eye (pupil) is larger than the other.  You twitch or shake violently (convulse).  Your speech is not clear (slurred).  You are more confused, easily angered (agitated), or annoyed than before.  You have more trouble resting than before.  You are unable to recognize people or places.  You have neck pain.  It is difficult to wake you up.  You have unusual behavior changes.  You pass out (lose consciousness). MAKE SURE YOU:   Understand these instructions.  Will watch your condition.  Will get help right away if you are not doing well or get worse. Document Released: 11/10/2009 Document Revised: 04/08/2014 Document Reviewed: 06/14/2013 [facility] Patient Information 2015 [place], [place]. This information is not intended to replace advice given to you by your health care provider. Make sure you discuss any questions you have with your health care provider.  As we discussed recommend brain rest. Take the pain medicine as needed for headaches may be most beneficial at night. If not improved in a week follow-up with neurology referral information provided. Return for any new or worse symptoms.

## 2015-02-13 ENCOUNTER — Encounter (HOSPITAL_COMMUNITY): Payer: Self-pay

## 2015-02-13 ENCOUNTER — Emergency Department (HOSPITAL_COMMUNITY)
Admission: EM | Admit: 2015-02-13 | Discharge: 2015-02-13 | Disposition: A | Discharge status: Home / Self Care | Payer: Medicare PPO | Attending: Emergency Medicine | Admitting: Emergency Medicine

## 2015-02-13 DIAGNOSIS — Z79899 Other long term (current) drug therapy: Secondary | ICD-10-CM | POA: Insufficient documentation

## 2015-02-13 DIAGNOSIS — Z8669 Personal history of other diseases of the nervous system and sense organs: Secondary | ICD-10-CM | POA: Diagnosis not present

## 2015-02-13 DIAGNOSIS — W219XXD Striking against or struck by unspecified sports equipment, subsequent encounter: Secondary | ICD-10-CM | POA: Insufficient documentation

## 2015-02-13 DIAGNOSIS — I2510 Atherosclerotic heart disease of native coronary artery without angina pectoris: Secondary | ICD-10-CM | POA: Diagnosis not present

## 2015-02-13 DIAGNOSIS — I10 Essential (primary) hypertension: Secondary | ICD-10-CM | POA: Insufficient documentation

## 2015-02-13 DIAGNOSIS — Z7982 Long term (current) use of aspirin: Secondary | ICD-10-CM | POA: Insufficient documentation

## 2015-02-13 DIAGNOSIS — Z951 Presence of aortocoronary bypass graft: Secondary | ICD-10-CM | POA: Diagnosis not present

## 2015-02-13 DIAGNOSIS — E119 Type 2 diabetes mellitus without complications: Secondary | ICD-10-CM | POA: Diagnosis not present

## 2015-02-13 DIAGNOSIS — Z794 Long term (current) use of insulin: Secondary | ICD-10-CM | POA: Diagnosis not present

## 2015-02-13 DIAGNOSIS — S060X0D Concussion without loss of consciousness, subsequent encounter: Secondary | ICD-10-CM

## 2015-02-13 DIAGNOSIS — Z87448 Personal history of other diseases of urinary system: Secondary | ICD-10-CM | POA: Insufficient documentation

## 2015-02-13 DIAGNOSIS — E785 Hyperlipidemia, unspecified: Secondary | ICD-10-CM | POA: Insufficient documentation

## 2015-02-13 DIAGNOSIS — Z87442 Personal history of urinary calculi: Secondary | ICD-10-CM | POA: Diagnosis not present

## 2015-02-13 DIAGNOSIS — R4182 Altered mental status, unspecified: Secondary | ICD-10-CM | POA: Diagnosis present

## 2015-02-13 DIAGNOSIS — Z862 Personal history of diseases of the blood and blood-forming organs and certain disorders involving the immune mechanism: Secondary | ICD-10-CM | POA: Diagnosis not present

## 2015-02-13 MED ORDER — DEXAMETHASONE SODIUM PHOSPHATE 10 MG/ML IJ SOLN
10.0000 mg | Freq: Once | INTRAMUSCULAR | Status: AC
  Administered 2015-02-13: 10 mg via INTRAVENOUS
  Filled 2015-02-13: qty 1

## 2015-02-13 MED ORDER — SODIUM CHLORIDE 0.9 % IV BOLUS (SEPSIS)
1000.0000 mL | Freq: Once | INTRAVENOUS | Status: AC
  Administered 2015-02-13: 1000 mL via INTRAVENOUS

## 2015-02-13 MED ORDER — DIPHENHYDRAMINE HCL 50 MG/ML IJ SOLN
25.0000 mg | Freq: Once | INTRAMUSCULAR | Status: AC
  Administered 2015-02-13: 25 mg via INTRAVENOUS
  Filled 2015-02-13: qty 1

## 2015-02-13 MED ORDER — METOCLOPRAMIDE HCL 5 MG/ML IJ SOLN
10.0000 mg | Freq: Once | INTRAMUSCULAR | Status: AC
  Administered 2015-02-13: 10 mg via INTRAVENOUS
  Filled 2015-02-13: qty 2

## 2015-02-13 MED ORDER — KETOROLAC TROMETHAMINE 15 MG/ML IJ SOLN
15.0000 mg | Freq: Once | INTRAMUSCULAR | Status: AC
  Administered 2015-02-13: 15 mg via INTRAVENOUS
  Filled 2015-02-13: qty 1

## 2015-02-13 MED ORDER — DIPHENHYDRAMINE HCL 50 MG/ML IJ SOLN
25.0000 mg | Freq: Once | INTRAMUSCULAR | Status: DC
  Filled 2015-02-13: qty 1

## 2015-02-13 NOTE — ED Provider Notes (Signed)
CSN: [id number]     Arrival date & time 02/13/15  1527 History   First MD Initiated Contact with Patient 02/13/15 1528     Chief Complaint  Patient presents with  . Altered Mental Status     (Consider location/radiation/quality/duration/timing/severity/associated sxs/prior Treatment) Patient is a 69 y.o. male presenting with head injury. The history is provided by the patient and medical records.  Head Injury Location:  Occipital Time since incident:  2 days Mechanism of injury: direct blow   Mechanism of injury comment:  Baseball to the head Pain details:    Quality:  Aching   Radiates to:  Face   Severity:  Moderate   Timing:  Constant   Progression:  Unchanged Chronicity:  New Relieved by:  None tried Worsened by:  Movement and position changes Ineffective treatments:  None tried Associated symptoms: disorientation, headache, loss of consciousness, nausea and vomiting   Associated symptoms: no blurred vision and no focal weakness     Past Medical History  Diagnosis Date  . Diabetes mellitus type II, uncontrolled   . OSA (obstructive sleep apnea)   . Hyperlipidemia   . RBBB (right bundle branch block)   . Depression     Bipolar  . Edema   . Hypertension   . Falling episodes     these have occurred in the past and again recurring 2011  . Spondylosis     C5-6, C6-7 MRI 2010  . CAD (coronary artery disease)     A. CABG in 2000,status post cardiac cath in 2006, 2009 ....continued chest pain and SOB despite oral medication adjestments including Ranexa. B. Cath November 2009/ DES  Stent...11/26/2008 to distal  RCA leading to acute marginal.  C. Cath 07/2012 for CP - stable anatomy, med rx.  Marland Kitchen Hx of CABG     2000,  / one median sternotomy suture broken her chest x-ray November, 2010, no clinical significance  . Palpitations     event recorder showed sinus rhythm  . Nephrolithiasis   . Restless leg   . Dizziness   . Shortness of breath     CPX April, 2011, mild  functional limitation, no clear pulmonary or cardiac limitation, possible deconditioning and mild chronotropic incompetence( peak heart rate 130)  . Cerebral ischemia     MRI November, 2010, chronic microvascular ischemia  . Anemia     hemoglobin 7.4, iron deficiency, January, 2011, 2 unit transfusion, endoscopy normal, capsule endoscopy February, 2011 no small bowel abnormalities.   Most likely source gastric erosions, followed by GI  . H/O medication noncompliance     Due to loss of insurance  . Ejection fraction     EF 60%, echo, July 31, 2012  . Diabetes mellitus without complication   . Pain in limb 06/12/2009    Qualifier: Diagnosis of  By: [name]   . Low back pain 06/12/2009    Qualifier: Diagnosis of  By: [name]   . Thyroid disease   . Right knee pain 01/07/2015   Past Surgical History  Procedure Laterality Date  . Nasal septum surgery      UP3  . Coronary artery bypass graft      2000  . Left heart catheterization with coronary/graft angiogram N/A 08/01/2012    Procedure: LEFT HEART CATHETERIZATION WITH Beatrix Fetters;  Surgeon: [name], MD;  Location: [facility] C[facility] LAB;  Service: Cardiovascular;  Laterality: N/A;  . Left heart catheterization with coronary/graft angiogram N/A 01/03/2015  Procedure: LEFT HEART CATHETERIZATION WITH Beatrix Fetters;  Surgeon: [name], MD;  Location: [facility] CATH LAB;  Service: Cardiovascular;  Laterality: N/A;   Family History  Problem Relation Age of Onset  . Cancer Brother     pancreatic cancer  . Diabetes Brother     4   . Stroke Brother   . Diabetes Brother   . Cancer Brother   . Diabetes Brother   . Coronary artery disease Brother   . Diabetes Brother   . Hypothyroidism Brother   . Heart attack      Nephew   History  Substance Use Topics  . Smoking status: Never Smoker   . Smokeless tobacco: Never Used  . Alcohol Use: No    Review of Systems  Constitutional: Positive for  appetite change. Negative for activity change.  Eyes: Negative for blurred vision.  Respiratory: Negative for cough, chest tightness and shortness of breath.   Cardiovascular: Negative for chest pain.  Gastrointestinal: Positive for nausea and vomiting. Negative for diarrhea and constipation.  Neurological: Positive for loss of consciousness and headaches. Negative for focal weakness, speech difficulty, weakness and light-headedness.  Psychiatric/Behavioral: Positive for confusion.  All other systems reviewed and are negative.     Allergies  Morphine and Shellfish-derived products  Home Medications   Prior to Admission medications   Medication Sig Start Date End Date Taking? Authorizing Provider  aspirin EC 325 MG EC tablet Take 1 tablet (325 mg total) by mouth daily. 01/04/15   Bonnielee Haff, MD  BAYER CONTOUR NEXT TEST test strip  01/29/15   Historical Provider, MD  clotrimazole-betamethasone (LOTRISONE) cream APPLY TO THE AFFECTED TWICE DAILY 11/23/13   Lisabeth Pick, MD  diclofenac sodium (VOLTAREN) 1 % GEL Apply 2 g topically 3 (three) times daily as needed. For pain to affected area 10/24/12   Doe-Hyun R Shawna Orleans, DO  furosemide (LASIX) 20 MG tablet Take 1 tablet (20 mg total) by mouth daily. 05/07/13   Doe-Hyun R Shawna Orleans, DO  gabapentin (NEURONTIN) 300 MG capsule Take 300 mg by mouth 3 (three) times daily.    Historical Provider, MD  HYDROcodone-acetaminophen (NORCO/VICODIN) 5-325 MG per tablet Take 2 tablets by mouth every 4 (four) hours as needed. 10/30/14   Fransico Meadow, PA-C  HYDROcodone-acetaminophen (NORCO/VICODIN) 5-325 MG per tablet Take 1-2 tablets by mouth every 6 (six) hours as needed for moderate pain. 02/12/15   Fredia Sorrow, MD  hyoscyamine (LEVSIN, ANASPAZ) 0.125 MG tablet Take 1 tablet (0.125 mg total) by mouth every 8 (eight) hours as needed for cramping. 03/06/13   Doe-Hyun R Shawna Orleans, DO  insulin aspart (NOVOLOG) 100 UNIT/ML injection Use 10-17 unit 3 times daily before meals  12/12/14   Philemon Kingdom, MD  Insulin Glargine (TOUJEO SOLOSTAR) 300 UNIT/ML SOPN Inject 36 Units into the skin at bedtime. 12/12/14   Philemon Kingdom, MD  isosorbide mononitrate (IMDUR) 120 MG 24 hr tablet Take 1 tablet (120 mg total) by mouth daily. 05/08/13   Doe-Hyun R Shawna Orleans, DO  levothyroxine (SYNTHROID, LEVOTHROID) 75 MCG tablet Take 1 tablet (75 mcg total) by mouth daily. 05/07/13   Doe-Hyun R Shawna Orleans, DO  metFORMIN (GLUCOPHAGE) 1000 MG tablet Take 1 tablet (1,000 mg total) by mouth 2 (two) times daily with a meal. Please resume on 01/05/15 01/04/15   Bonnielee Haff, MD  nitroGLYCERIN (NITROSTAT) 0.4 MG SL tablet Place 1 tablet (0.4 mg total) under the tongue every 5 (five) minutes as needed (up to 3 doses). 05/08/13   Doe-Hyun  Kyra Searles, DO  NOVOLOG FLEXPEN 100 UNIT/ML FlexPen  01/29/15   Historical Provider, MD  pantoprazole (PROTONIX) 40 MG tablet Take twice daily for 2 weeks to see if it helps the symptoms. 01/31/15   [name], MD  pramipexole (MIRAPEX) 0.25 MG tablet Take 1 tablet (0.25 mg total) by mouth at bedtime. 05/08/13   Doe-Hyun R Shawna Orleans, DO  sertraline (ZOLOFT) 100 MG tablet Take 1 tablet (100 mg total) by mouth 2 (two) times daily. 05/07/13   Doe-Hyun R Shawna Orleans, DO  simvastatin (ZOCOR) 40 MG tablet Take 1 tablet (40 mg total) by mouth every evening. 05/11/13   Doe-Hyun R Shawna Orleans, DO  zolpidem (AMBIEN) 5 MG tablet Take 1 tablet (5 mg total) by mouth at bedtime as needed for sleep. 03/06/13   Doe-Hyun R Shawna Orleans, DO   BP 132/66 mmHg  Pulse 81  Temp(Src) 98 F (36.7 C) (Oral)  Resp 14  SpO2 97% Physical Exam  Constitutional: He is oriented to person, place, and time. He appears well-developed and well-nourished. No distress.  Alert, oriented, pleasant, cooperative  HENT:  Head: Normocephalic and atraumatic.  Mouth/Throat: Oropharynx is clear and moist. No oropharyngeal exudate.  Eyes: Conjunctivae and EOM are normal. Pupils are equal, round, and reactive to light.  Neck: Normal range of motion. Neck supple.   Cardiovascular: Normal rate, regular rhythm, normal heart sounds and intact distal pulses.  Exam reveals no gallop and no friction rub.   No murmur heard. Pulmonary/Chest: Effort normal and breath sounds normal. No respiratory distress. He has no wheezes. He has no rales.  Abdominal: Soft. He exhibits no distension and no mass. There is no tenderness. There is no rebound and no guarding.  Musculoskeletal: Normal range of motion. He exhibits no edema or tenderness.  Lymphadenopathy:    He has no cervical adenopathy.  Neurological: He is alert and oriented to person, place, and time. He has normal strength. No cranial nerve deficit or sensory deficit. He displays a negative Romberg sign. Coordination and gait normal.  Intact finger-nose-finger. Normal gait and tandem gait.  Skin: Skin is warm and dry. No rash noted. He is not diaphoretic.  Psychiatric: He has a normal mood and affect. His behavior is normal. Judgment and thought content normal.  Nursing note and vitals reviewed.   ED Course  Procedures (including critical care time) Labs Review Labs Reviewed - No data to display  Imaging Review Ct Head Wo Contrast  02/12/2015   CLINICAL DATA:  Occipital headache, dizziness and unsteady gait since the patient suffered blows to his face mask 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 Initial encounter.  EXAM: CT HEAD WITHOUT CONTRAST  CT CERVICAL SPINE WITHOUT CONTRAST  TECHNIQUE: Multidetector CT imaging of the head and cervical spine was performed following the standard protocol without intravenous contrast. Multiplanar CT image reconstructions of the cervical spine were also generated.  COMPARISON:  Head CT scan 07/30/2005.  Brain MRI 10/15/2009.  FINDINGS: CT HEAD FINDINGS  There is no evidence of acute intracranial abnormality including hemorrhage, infarct, mass lesion, mass effect, midline shift or abnormal extra-axial fluid collection. Imaged paranasal sinuses and mastoid air cells are  clear. The calvarium is intact.  CT CERVICAL SPINE FINDINGS  No fracture or malalignment of the cervical spine is identified. Loss of disc space height and endplate spurring appear worst at C6-7. Scattered facet arthropathy is noted. Lung apices are clear.  IMPRESSION: No acute abnormality head or cervical spine.  C6-7 degenerative disc disease.   Electronically Signed  By: Inge Rise [name]   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 Initial encounter.  EXAM: CT HEAD WITHOUT CONTRAST  CT CERVICAL SPINE WITHOUT CONTRAST  TECHNIQUE: Multidetector CT imaging of the head and cervical spine was performed following the standard protocol without intravenous contrast. Multiplanar CT image reconstructions of the cervical spine were also generated.  COMPARISON:  Head CT scan 07/30/2005.  Brain MRI 10/15/2009.  FINDINGS: CT HEAD FINDINGS  There is no evidence of acute intracranial abnormality including hemorrhage, infarct, mass lesion, mass effect, midline shift or abnormal extra-axial fluid collection. Imaged paranasal sinuses and mastoid air cells are clear. The calvarium is intact.  CT CERVICAL SPINE FINDINGS  No fracture or malalignment of the cervical spine is identified. Loss of disc space height and endplate spurring appear worst at C6-7. Scattered facet arthropathy is noted. Lung apices are clear.  IMPRESSION: No acute abnormality head or cervical spine.  C6-7 degenerative disc disease.   Electronically Signed   By: Inge Rise [name]   On: 02/12/2015 14:51     EKG Interpretation   Date/Time:  Thursday February 13 2015 16:24:20 EST Ventricular Rate:  76 PR Interval:  188 QRS Duration: 121 QT Interval:  412 QTC Calculation: 463 R Axis:   25 Text Interpretation:  Sinus rhythm Right bundle branch block No  significant  change was found Confirmed by CAMPOS  MD, Lennette Bihari ([id number]) on  02/13/2015 5:52:51 PM      MDM   Final diagnoses:  Concussion, without loss of consciousness, subsequent encounter    [name] [name] is a 69 y.o. male who presents with headache, nausea, dizziness, possible syncope. Hit in the head two days ago with a baseball. Has repeated head trauma and concussions. Seen here yesterday for concussive symptoms and CT head negative and labs normal. Returns with persistent symptoms and possible syncopal episode after feeling unwell, nausea. He laid down on the bed and was hard to awaken.  [facility]VSS on arrival. Non-focal neuro exam. Headache cocktail given with significant improvement in pain. Able to ambulate unassisted after treatment and fluids. Symptoms consistent with continued post-concussive symptoms. Has neuro follow up. Unlikely delayed bleed not on blood thinners and no other trauma. No signs of CVA or stroke. Will defer further workup at this time given clear etiology of trauma and concussion. Follow up with neurology.      Larence Penning, MD 02/13/15 1805  Jola Schmidt, MD 02/14/15 [phone]

## 2015-02-13 NOTE — ED Notes (Signed)
[facility]- Pt coming in from home after being hit in the umpire mask 3 times by a ball this week. Headache and neck pain began Monday and he was seen here with a negative CT. Pt noted to be lethargic by his family today. Went to sleep and became nearly unconscious. Also reports blurred vision and weakness. No distress noted.

## 2015-02-13 NOTE — Discharge Instructions (Signed)
Concussion A concussion is a brain injury. It is caused by:  A hit to the head.  A quick and sudden movement (jolt) of the head or neck. A concussion is usually not life threatening. Even so, it can cause serious problems. If you had a concussion before, you may have concussion-like problems after a hit to your head. HOME CARE General Instructions  Follow your doctor's directions carefully.  Take medicines only as told by your doctor.  Only take medicines your doctor says are safe.  Do not drink alcohol until your doctor says it is okay. Alcohol and some drugs can slow down healing. They can also put you at risk for further injury.  If you are having trouble remembering things, write them down.  Try to do one thing at a time if you get distracted easily. For example, do not watch TV while making dinner.  Talk to your family members or close friends when making important decisions.  Follow up with your doctor as told.  Watch your symptoms. Tell others to do the same. Serious problems can sometimes happen after a concussion. Older adults are more likely to have these problems.  Tell your teachers, school nurse, school counselor, coach, Product/process development scientist, or work Freight forwarder about your concussion. Tell them about what you can or cannot do. They should watch to see if:  It gets even harder for you to pay attention or concentrate.  It gets even harder for you to remember things or learn new things.  You need more time than normal to finish things.  You become annoyed (irritable) more than before.  You are not able to deal with stress as well.  You have more problems than before.  Rest. Make sure you:  Get plenty of sleep at night.  Go to sleep early.  Go to bed at the same time every day. Try to wake up at the same time.  Rest during the day.  Take naps when you feel tired.  Limit activities where you have to think a lot or concentrate. These include:  Doing  homework.  Doing work related to a job.  Watching TV.  Using the computer. Returning To Your Regular Activities Return to your normal activities slowly, not all at once. You must give your body and brain enough time to heal.   Do not play sports or do other athletic activities until your doctor says it is okay.  Ask your doctor when you can drive, ride a bicycle, or work other vehicles or machines. Never do these things if you feel dizzy.  Ask your doctor about when you can return to work or school. Preventing Another Concussion It is very important to avoid another brain injury, especially before you have healed. In rare cases, another injury can lead to permanent brain damage, brain swelling, or death. The risk of this is greatest during the first 7-10 days after your injury. Avoid injuries by:   Wearing a seat belt when riding in a car.  Not drinking too much alcohol.  Avoiding activities that could lead to a second concussion (such as contact sports).  Wearing a helmet when doing activities like:  Biking.  Skiing.  Skateboarding.  Skating.  Making your home safer by:  Removing things from the floor or stairways that could make you trip.  Using grab bars in bathrooms and handrails by stairs.  Placing non-slip mats on floors and in bathtubs.  Improve lighting in dark areas. GET HELP IF:  It  gets even harder for you to pay attention or concentrate.  It gets even harder for you to remember things or learn new things.  You need more time than normal to finish things.  You become annoyed (irritable) more than before.  You are not able to deal with stress as well.  You have more problems than before.  You have problems keeping your balance.  You are not able to react quickly when you should. Get help if you have any of these problems for more than 2 weeks:   Lasting (chronic) headaches.  Dizziness or trouble balancing.  Feeling sick to your stomach  (nausea).  Seeing (vision) problems.  Being affected by noises or light more than normal.  Feeling sad, low, down in the dumps, blue, gloomy, or empty (depressed).  Mood changes (mood swings).  Feeling of fear or nervousness about what may happen (anxiety).  Feeling annoyed.  Memory problems.  Problems concentrating or paying attention.  Sleep problems.  Feeling tired all the time. GET HELP RIGHT AWAY IF:   You have bad headaches or your headaches get worse.  You have weakness (even if it is in one hand, leg, or part of the face).  You have loss of feeling (numbness).  You feel off balance.  You keep throwing up (vomiting).  You feel tired.  One black center of your eye (pupil) is larger than the other.  You twitch or shake violently (convulse).  Your speech is not clear (slurred).  You are more confused, easily angered (agitated), or annoyed than before.  You have more trouble resting than before.  You are unable to recognize people or places.  You have neck pain.  It is difficult to wake you up.  You have unusual behavior changes.  You pass out (lose consciousness). MAKE SURE YOU:   Understand these instructions.  Will watch your condition.  Will get help right away if you are not doing well or get worse. Document Released: 11/10/2009 Document Revised: 04/08/2014 Document Reviewed: 06/14/2013 [facility] Patient Information 2015 [place], [place]. This information is not intended to replace advice given to you by your health care provider. Make sure you discuss any questions you have with your health care provider.

## 2015-02-18 ENCOUNTER — Ambulatory Visit (INDEPENDENT_AMBULATORY_CARE_PROVIDER_SITE_OTHER): Payer: Medicare PPO | Admitting: Neurology

## 2015-02-18 ENCOUNTER — Encounter: Payer: Self-pay | Admitting: Neurology

## 2015-02-18 VITALS — BP 129/75 | HR 78 | Ht 69.0 in | Wt 185.2 lb

## 2015-02-18 DIAGNOSIS — S060X0A Concussion without loss of consciousness, initial encounter: Secondary | ICD-10-CM | POA: Insufficient documentation

## 2015-02-18 DIAGNOSIS — G44309 Post-traumatic headache, unspecified, not intractable: Secondary | ICD-10-CM | POA: Insufficient documentation

## 2015-02-18 DIAGNOSIS — G44311 Acute post-traumatic headache, intractable: Secondary | ICD-10-CM

## 2015-02-18 MED ORDER — TOPIRAMATE 25 MG PO TABS: ORAL_TABLET | ORAL | Status: DC

## 2015-02-18 NOTE — Patient Instructions (Signed)
Concussion  A concussion, or closed-head injury, is a brain injury caused by a direct blow to the head or by a quick and sudden movement (jolt) of the head or neck. Concussions are usually not life-threatening. Even so, the effects of a concussion can be serious. If you have had a concussion before, you are more likely to experience concussion-like symptoms after a direct blow to the head.   CAUSES  · Direct blow to the head, such as from running into another player during a soccer game, being hit in a fight, or hitting your head on a hard surface.  · A jolt of the head or neck that causes the brain to move back and forth inside the skull, such as in a car crash.  SIGNS AND SYMPTOMS  The signs of a concussion can be hard to notice. Early on, they may be missed by you, family members, and health care providers. You may look fine but act or feel differently.  Symptoms are usually temporary, but they may last for days, weeks, or even longer. Some symptoms may appear right away while others may not show up for hours or days. Every head injury is different. Symptoms include:  · Mild to moderate headaches that will not go away.  · A feeling of pressure inside your head.  · Having more trouble than usual:  ¨ Learning or remembering things you have heard.  ¨ Answering questions.  ¨ Paying attention or concentrating.  ¨ Organizing daily tasks.  ¨ Making decisions and solving problems.  · Slowness in thinking, acting or reacting, speaking, or reading.  · Getting lost or being easily confused.  · Feeling tired all the time or lacking energy (fatigued).  · Feeling drowsy.  · Sleep disturbances.  ¨ Sleeping more than usual.  ¨ Sleeping less than usual.  ¨ Trouble falling asleep.  ¨ Trouble sleeping (insomnia).  · Loss of balance or feeling lightheaded or dizzy.  · Nausea or vomiting.  · Numbness or tingling.  · Increased sensitivity to:  ¨ Sounds.  ¨ Lights.  ¨ Distractions.  · Vision problems or eyes that tire  easily.  · Diminished sense of taste or smell.  · Ringing in the ears.  · Mood changes such as feeling sad or anxious.  · Becoming easily irritated or angry for little or no reason.  · Lack of motivation.  · Seeing or hearing things other people do not see or hear (hallucinations).  DIAGNOSIS  Your health care provider can usually diagnose a concussion based on a description of your injury and symptoms. He or she will ask whether you passed out (lost consciousness) and whether you are having trouble remembering events that happened right before and during your injury.  Your evaluation might include:  · A brain scan to look for signs of injury to the brain. Even if the test shows no injury, you may still have a concussion.  · Blood tests to be sure other problems are not present.  TREATMENT  · Concussions are usually treated in an emergency department, in urgent care, or at a clinic. You may need to stay in the hospital overnight for further treatment.  · Tell your health care provider if you are taking any medicines, including prescription medicines, over-the-counter medicines, and natural remedies. Some medicines, such as blood thinners (anticoagulants) and aspirin, may increase the chance of complications. Also tell your health care provider whether you have had alcohol or are taking illegal drugs. This information   may affect treatment.  · Your health care provider will send you home with important instructions to follow.  · How fast you will recover from a concussion depends on many factors. These factors include how severe your concussion is, what part of your brain was injured, your age, and how healthy you were before the concussion.  · Most people with mild injuries recover fully. Recovery can take time. In general, recovery is slower in older persons. Also, persons who have had a concussion in the past or have other medical problems may find that it takes longer to recover from their current injury.  HOME  CARE INSTRUCTIONS  General Instructions  · Carefully follow the directions your health care provider gave you.  · Only take over-the-counter or prescription medicines for pain, discomfort, or fever as directed by your health care provider.  · Take only those medicines that your health care provider has approved.  · Do not drink alcohol until your health care provider says you are well enough to do so. Alcohol and certain other drugs may slow your recovery and can put you at risk of further injury.  · If it is harder than usual to remember things, write them down.  · If you are easily distracted, try to do one thing at a time. For example, do not try to watch TV while fixing dinner.  · Talk with family members or close friends when making important decisions.  · Keep all follow-up appointments. Repeated evaluation of your symptoms is recommended for your recovery.  · Watch your symptoms and tell others to do the same. Complications sometimes occur after a concussion. Older adults with a brain injury may have a higher risk of serious complications, such as a blood clot on the brain.  · Tell your teachers, school nurse, school counselor, coach, athletic trainer, or work manager about your injury, symptoms, and restrictions. Tell them about what you can or cannot do. They should watch for:  ¨ Increased problems with attention or concentration.  ¨ Increased difficulty remembering or learning new information.  ¨ Increased time needed to complete tasks or assignments.  ¨ Increased irritability or decreased ability to cope with stress.  ¨ Increased symptoms.  · Rest. Rest helps the brain to heal. Make sure you:  ¨ Get plenty of sleep at night. Avoid staying up late at night.  ¨ Keep the same bedtime hours on weekends and weekdays.  ¨ Rest during the day. Take daytime naps or rest breaks when you feel tired.  · Limit activities that require a lot of thought or concentration. These include:  ¨ Doing homework or job-related  work.  ¨ Watching TV.  ¨ Working on the computer.  · Avoid any situation where there is potential for another head injury (football, hockey, soccer, basketball, martial arts, downhill snow sports and horseback riding). Your condition will get worse every time you experience a concussion. You should avoid these activities until you are evaluated by the appropriate follow-up health care providers.  Returning To Your Regular Activities  You will need to return to your normal activities slowly, not all at once. You must give your body and brain enough time for recovery.  · Do not return to sports or other athletic activities until your health care provider tells you it is safe to do so.  · Ask your health care provider when you can drive, ride a bicycle, or operate heavy machinery. Your ability to react may be slower after a   brain injury. Never do these activities if you are dizzy.  · Ask your health care provider about when you can return to work or school.  Preventing Another Concussion  It is very important to avoid another brain injury, especially before you have recovered. In rare cases, another injury can lead to permanent brain damage, brain swelling, or death. The risk of this is greatest during the first 7-10 days after a head injury. Avoid injuries by:  · Wearing a seat belt when riding in a car.  · Drinking alcohol only in moderation.  · Wearing a helmet when biking, skiing, skateboarding, skating, or doing similar activities.  · Avoiding activities that could lead to a second concussion, such as contact or recreational sports, until your health care provider says it is okay.  · Taking safety measures in your home.  ¨ Remove clutter and tripping hazards from floors and stairways.  ¨ Use grab bars in bathrooms and handrails by stairs.  ¨ Place non-slip mats on floors and in bathtubs.  ¨ Improve lighting in dim areas.  SEEK MEDICAL CARE IF:  · You have increased problems paying attention or  concentrating.  · You have increased difficulty remembering or learning new information.  · You need more time to complete tasks or assignments than before.  · You have increased irritability or decreased ability to cope with stress.  · You have more symptoms than before.  Seek medical care if you have any of the following symptoms for more than 2 weeks after your injury:  · Lasting (chronic) headaches.  · Dizziness or balance problems.  · Nausea.  · Vision problems.  · Increased sensitivity to noise or light.  · Depression or mood swings.  · Anxiety or irritability.  · Memory problems.  · Difficulty concentrating or paying attention.  · Sleep problems.  · Feeling tired all the time.  SEEK IMMEDIATE MEDICAL CARE IF:  · You have severe or worsening headaches. These may be a sign of a blood clot in the brain.  · You have weakness (even if only in one hand, leg, or part of the face).  · You have numbness.  · You have decreased coordination.  · You vomit repeatedly.  · You have increased sleepiness.  · One pupil is larger than the other.  · You have convulsions.  · You have slurred speech.  · You have increased confusion. This may be a sign of a blood clot in the brain.  · You have increased restlessness, agitation, or irritability.  · You are unable to recognize people or places.  · You have neck pain.  · It is difficult to wake you up.  · You have unusual behavior changes.  · You lose consciousness.  MAKE SURE YOU:  · Understand these instructions.  · Will watch your condition.  · Will get help right away if you are not doing well or get worse.  Document Released: 02/12/2004 Document Revised: 11/27/2013 Document Reviewed: 06/14/2013  ExitCare® Patient Information ©2015 [employer]. This information is not intended to replace advice given to you by your health care provider. Make sure you discuss any questions you have with your health care provider.

## 2015-02-18 NOTE — Progress Notes (Signed)
Reason for visit: Concussion  Referring physician: [place]  [name] [name] is a 69 y.o. male  History of present illness:  [name] [name] is a 69 year old right-handed white male with a history of diabetes and coronary artery disease. [name] [name] has been playing baseball recently, and he was playing as a Geneticist, molecular. [name] [name] was struck in [name] face mask on 02/11/2015, and he was hit twice [name] next day, again both times in [name] face mask. [name] [name] developed headaches, and neck stiffness following these events. [name] [name] apparently went home, and fell asleep, and was difficult to arouse, and had a headache and confusion when he awakened. EMS was called, and he went to [name] emergency room and a CT scan of [name] head was done, this was unremarkable. [name] [name] was told that he had a concussion. Since that time, [name] [name] has continued to have daily headaches, better in [name] morning, coming on later in [name] occipital area, occasionally spreading to [name] frontal area. [name] [name] also has some neck stiffness and crepitus in [name] neck. He may have some nausea and photophobia with [name] headache. He denies any pre-existing history of headache. [name] [name] denies any numbness or weakness of [name] extremities, and he does have some dizziness and balance issues with [name] headache. He denies troubles controlling [name] bowels or [name] bladder. He comes to this office for further evaluation.  Past Medical History  Diagnosis Date  . Diabetes mellitus type II, uncontrolled   . OSA (obstructive sleep apnea)   . Hyperlipidemia   . RBBB (right bundle branch block)   . Depression     Bipolar  . Edema   . Hypertension   . Falling episodes     these have occurred in [name] past and again recurring 2011  . Spondylosis     C5-6, C6-7 MRI 2010  . CAD (coronary artery disease)     A. CABG in 2000,status post cardiac cath in 2006, 2009 ....continued chest pain and SOB despite oral medication adjestments including Ranexa.  B. Cath November 2009/ DES  Stent...11/26/2008 to distal  RCA leading to acute marginal.  C. Cath 07/2012 for CP - stable anatomy, med rx.  Marland Kitchen Hx of CABG     2000,  / one median sternotomy suture broken her chest x-ray November, 2010, no clinical significance  . Palpitations     event recorder showed sinus rhythm  . Nephrolithiasis   . Restless leg   . Dizziness   . Shortness of breath     CPX April, 2011, mild functional limitation, no clear pulmonary or cardiac limitation, possible deconditioning and mild chronotropic incompetence( peak heart rate 130)  . Cerebral ischemia     MRI November, 2010, chronic microvascular ischemia  . Anemia     hemoglobin 7.4, iron deficiency, January, 2011, 2 unit transfusion, endoscopy normal, capsule endoscopy February, 2011 no small bowel abnormalities.   Most likely source gastric erosions, followed by GI  . H/O medication noncompliance     Due to loss of insurance  . Ejection fraction     EF 60%, echo, July 31, 2012  . Diabetes mellitus without complication   . Pain in limb 06/12/2009    Qualifier: Diagnosis of  By: [name]   . Low back pain 06/12/2009    Qualifier: Diagnosis of  By: [name]   . Thyroid disease   . Right knee pain 01/07/2015    Past Surgical History  Procedure Laterality Date  . Nasal septum surgery      UP3  . Coronary artery bypass graft      2000  . Left heart catheterization with coronary/graft angiogram N/A 08/01/2012    Procedure: LEFT HEART CATHETERIZATION WITH Beatrix Fetters;  Surgeon: [name], MD;  Location: [facility] CATH LAB;  Service: Cardiovascular;  Laterality: N/A;  . Left heart catheterization with coronary/graft angiogram N/A 01/03/2015    Procedure: LEFT HEART CATHETERIZATION WITH Beatrix Fetters;  Surgeon: [name], MD;  Location: [facility] CATH LAB;  Service: Cardiovascular;  Laterality: N/A;    Family History  Problem Relation Age of Onset  . Cancer Brother      pancreatic cancer  . Diabetes Brother     4   . Stroke Brother   . Diabetes Brother   . Cancer Brother   . Diabetes Brother   . Coronary artery disease Brother   . Diabetes Brother   . Hypothyroidism Brother   . Heart attack      Nephew    Social history:  reports that he has never smoked. He has never used smokeless tobacco. He reports that he does not drink alcohol or use illicit drugs.  Medications:  Prior to Admission medications   Medication Sig Start Date End Date Taking? Authorizing Provider  aspirin EC 325 MG EC tablet Take 1 tablet (325 mg total) by mouth daily. 01/04/15   Bonnielee Haff, MD  BAYER CONTOUR NEXT TEST test strip  01/29/15   Historical Provider, MD  clotrimazole-betamethasone (LOTRISONE) cream APPLY TO [name] AFFECTED TWICE DAILY 11/23/13   Lisabeth Pick, MD  diclofenac sodium (VOLTAREN) 1 % GEL Apply 2 g topically 3 (three) times daily as needed. For pain to affected area 10/24/12   Doe-Hyun R Shawna Orleans, DO  furosemide (LASIX) 20 MG tablet Take 1 tablet (20 mg total) by mouth daily. 05/07/13   Doe-Hyun R Shawna Orleans, DO  gabapentin (NEURONTIN) 300 MG capsule Take 300 mg by mouth 3 (three) times daily.    Historical Provider, MD  HYDROcodone-acetaminophen (NORCO/VICODIN) 5-325 MG per tablet Take 2 tablets by mouth every 4 (four) hours as needed. 10/30/14   Fransico Meadow, PA-C  HYDROcodone-acetaminophen (NORCO/VICODIN) 5-325 MG per tablet Take 1-2 tablets by mouth every 6 (six) hours as needed for moderate pain. 02/12/15   Fredia Sorrow, MD  hyoscyamine (LEVSIN, ANASPAZ) 0.125 MG tablet Take 1 tablet (0.125 mg total) by mouth every 8 (eight) hours as needed for cramping. 03/06/13   Doe-Hyun R Shawna Orleans, DO  insulin aspart (NOVOLOG) 100 UNIT/ML injection Use 10-17 unit 3 times daily before meals 12/12/14   Philemon Kingdom, MD  Insulin Glargine (TOUJEO SOLOSTAR) 300 UNIT/ML SOPN Inject 36 Units into [name] skin at bedtime. 12/12/14   Philemon Kingdom, MD  isosorbide mononitrate (IMDUR) 120 MG 24 hr  tablet Take 1 tablet (120 mg total) by mouth daily. 05/08/13   Doe-Hyun R Shawna Orleans, DO  levothyroxine (SYNTHROID, LEVOTHROID) 75 MCG tablet Take 1 tablet (75 mcg total) by mouth daily. 05/07/13   Doe-Hyun R Shawna Orleans, DO  metFORMIN (GLUCOPHAGE) 1000 MG tablet Take 1 tablet (1,000 mg total) by mouth 2 (two) times daily with a meal. Please resume on 01/05/15 01/04/15   Bonnielee Haff, MD  nitroGLYCERIN (NITROSTAT) 0.4 MG SL tablet Place 1 tablet (0.4 mg total) under [name] tongue every 5 (five) minutes as needed (up to 3 doses). 05/08/13   Doe-Hyun R Shawna Orleans, DO  NOVOLOG FLEXPEN 100 UNIT/ML FlexPen  01/29/15  Historical Provider, MD  pantoprazole (PROTONIX) 40 MG tablet Take twice daily for 2 weeks to see if it helps [name] symptoms. 01/31/15   [name], MD  pramipexole (MIRAPEX) 0.25 MG tablet Take 1 tablet (0.25 mg total) by mouth at bedtime. 05/08/13   Doe-Hyun R Shawna Orleans, DO  sertraline (ZOLOFT) 100 MG tablet Take 1 tablet (100 mg total) by mouth 2 (two) times daily. 05/07/13   Doe-Hyun R Shawna Orleans, DO  simvastatin (ZOCOR) 40 MG tablet Take 1 tablet (40 mg total) by mouth every evening. 05/11/13   Doe-Hyun R Shawna Orleans, DO  zolpidem (AMBIEN) 5 MG tablet Take 1 tablet (5 mg total) by mouth at bedtime as needed for sleep. 03/06/13   Doe-Hyun Kyra Searles, DO      Allergies  Allergen Reactions  . Morphine     REACTION: hallucinations  . Shellfish-Derived Products Itching    ROS:  Out of a complete 14 system review of symptoms, [name] [name] complains only of [name] following symptoms, and all other reviewed systems are negative.  Fatigue Hearing loss, dizziness Shortness of breath, snoring Memory loss, confusion, headache, numbness, weakness, dizziness, passing out Depression Snoring, restless legs  Blood pressure 129/75, pulse 78, height 5\' 9"  (1.753 m), weight 185 lb 3.2 oz (84.006 kg).  Physical Exam  General: [name] [name] is alert and cooperative at [name] time of [name] examination.  Eyes: Pupils are equal, round, and reactive to light. Discs  are flat bilaterally.  Neck: [name] neck is supple, no carotid bruits are noted.  Respiratory: [name] respiratory examination is clear.  Cardiovascular: [name] cardiovascular examination reveals a regular rate and rhythm, no obvious murmurs or rubs are noted.  Neuromuscular: Range of movement of [name] cervical spine is full.  Skin: Extremities are without significant edema.  Neurologic Exam  Mental status: [name] [name] is alert and oriented x 3 at [name] time of [name] examination. [name] [name] has apparent normal recent and remote memory, with an apparently normal attention span and concentration ability. Mini-Mental Status Examination done today shows a total score of 27/30.  Cranial nerves: Facial symmetry is present. There is good sensation of [name] face to pinprick and soft touch bilaterally. [name] strength of [name] facial muscles and [name] muscles to head turning and shoulder shrug are normal bilaterally. Speech is well enunciated, no aphasia or dysarthria is noted. Extraocular movements are full. Visual fields are full. [name] tongue is midline, and [name] [name] has symmetric elevation of [name] soft palate. No obvious hearing deficits are noted.  Motor: [name] motor testing reveals 5 over 5 strength of all 4 extremities. Good symmetric motor tone is noted throughout.  Sensory: Sensory testing is intact to pinprick, soft touch, vibration sensation, and position sense on all 4 extremities. No evidence of extinction is noted.  Coordination: Cerebellar testing reveals good finger-nose-finger and heel-to-shin bilaterally.  Gait and station: Gait is normal. Tandem gait is normal. Romberg is negative. No drift is seen.  Reflexes: Deep tendon reflexes are symmetric and normal bilaterally. Toes are downgoing bilaterally.   CT head 02/13/15:  IMPRESSION: No acute abnormality head or cervical spine.  C6-7 degenerative disc disease.  * CT scan images were reviewed online. I agree with [name] written  report.    Assessment/Plan:  1. Headache, confusion, probable trauma triggered migraine  2. Mild cervical strain syndrome  [name] [name] already appears to have improved from last week. He reports severe headaches last week associated with some confusion. [name] [name] had relatively minor trauma to [name] head,  [name] baseball hit him on [name] mask, not directly on [name] head. [name] [name] should have a full recovery, I am not clear that any further workup needs to be undertaken. [name] [name] will be placed on Topamax for what is felt to be trauma triggered migraine, he will follow-up in 6 weeks. We may go to get him off of [name] Topamax at that point. He is to refrain from playing baseball for least 4 weeks.  Jill Alexanders MD 02/18/2015 8:09 PM  [facility] [address] [place], [place] [address]  Phone [phone] Fax [phone]

## 2015-02-20 ENCOUNTER — Other Ambulatory Visit: Payer: Self-pay | Admitting: Family Medicine

## 2015-02-20 ENCOUNTER — Ambulatory Visit (INDEPENDENT_AMBULATORY_CARE_PROVIDER_SITE_OTHER): Payer: Medicare PPO | Admitting: Family Medicine

## 2015-02-20 ENCOUNTER — Encounter: Payer: Self-pay | Admitting: Family Medicine

## 2015-02-20 VITALS — BP 128/62 | HR 86 | Temp 98.2°F | Ht 69.0 in | Wt 184.2 lb

## 2015-02-20 DIAGNOSIS — I10 Essential (primary) hypertension: Secondary | ICD-10-CM | POA: Diagnosis not present

## 2015-02-20 DIAGNOSIS — E1165 Type 2 diabetes mellitus with hyperglycemia: Secondary | ICD-10-CM

## 2015-02-20 DIAGNOSIS — S060X0A Concussion without loss of consciousness, initial encounter: Secondary | ICD-10-CM

## 2015-02-20 DIAGNOSIS — E785 Hyperlipidemia, unspecified: Secondary | ICD-10-CM | POA: Diagnosis not present

## 2015-02-20 DIAGNOSIS — G44309 Post-traumatic headache, unspecified, not intractable: Secondary | ICD-10-CM

## 2015-02-20 DIAGNOSIS — G44311 Acute post-traumatic headache, intractable: Secondary | ICD-10-CM

## 2015-02-20 DIAGNOSIS — E039 Hypothyroidism, unspecified: Secondary | ICD-10-CM

## 2015-02-20 DIAGNOSIS — IMO0002 Reserved for concepts with insufficient information to code with codable children: Secondary | ICD-10-CM

## 2015-02-20 MED ORDER — INSULIN GLARGINE 300 UNIT/ML ~~LOC~~ SOPN: 42.0000 [IU] | PEN_INJECTOR | Freq: Every day | SUBCUTANEOUS | Status: DC

## 2015-02-20 MED ORDER — TOPIRAMATE 25 MG PO TABS
ORAL_TABLET | ORAL | Status: DC
Start: 2015-02-20 — End: 2015-02-20

## 2015-02-20 NOTE — Progress Notes (Signed)
Pre visit review using our clinic review tool, if applicable. No additional management support is needed unless otherwise documented below in the visit note. 

## 2015-02-20 NOTE — Patient Instructions (Signed)
Post-Concussion Syndrome Post-concussion syndrome describes the symptoms that can occur after a head injury. These symptoms can last from weeks to months. CAUSES  It is not clear why some head injuries cause post-concussion syndrome. It can occur whether your head injury was mild or severe and whether you were wearing head protection or not.  SIGNS AND SYMPTOMS  Memory difficulties.  Dizziness.  Headaches.  Double vision or blurry vision.  Sensitivity to light.  Hearing difficulties.  Depression.  Tiredness.  Weakness.  Difficulty with concentration.  Difficulty sleeping or staying asleep.  Vomiting.  Poor balance or instability on your feet.  Slow reaction time.  Difficulty learning and remembering things you have heard. DIAGNOSIS  There is no test to determine whether you have post-concussion syndrome. Your health care provider may order an imaging scan of your brain, such as a CT scan, to check for other problems that may be causing your symptoms (such as severe injury inside your skull). TREATMENT  Usually, these problems disappear over time without medical care. Your health care provider may prescribe medicine to help ease your symptoms. It is important to follow up with a neurologist to evaluate your recovery and address any lingering symptoms or issues. HOME CARE INSTRUCTIONS   Only take over-the-counter or prescription medicines for pain, discomfort, or fever as directed by your health care provider. Do not take aspirin. Aspirin can slow blood clotting.  Sleep with your head slightly elevated to help with headaches.  Avoid any situation where there is potential for another head injury (football, hockey, soccer, basketball, martial arts, downhill snow sports, and horseback riding). Your condition will get worse every time you experience a concussion. You should avoid these activities until you are evaluated by the appropriate follow-up health care  providers.  Keep all follow-up appointments as directed by your health care provider. SEEK IMMEDIATE MEDICAL CARE IF:  You develop confusion or unusual drowsiness.  You cannot wake the injured person.  You develop nausea or persistent, forceful vomiting.  You feel like you are moving when you are not (vertigo).  You notice the injured person's eyes moving rapidly back and forth. This may be a sign of vertigo.  You have convulsions or faint.  You have severe, persistent headaches that are not relieved by medicine.  You cannot use your arms or legs normally.  Your pupils change size.  You have clear or bloody discharge from the nose or ears.  Your problems are getting worse, not better. MAKE SURE YOU:  Understand these instructions.  Will watch your condition.  Will get help right away if you are not doing well or get worse. Document Released: 05/14/2002 Document Revised: 09/12/2013 Document Reviewed: 02/27/2014 Exit[employer] Patient Information 2015 [employer]. This information is not intended to replace advice given to you by your health care provider. Make sure you discuss any questions you have with your health care provider.  

## 2015-02-24 ENCOUNTER — Telehealth: Payer: Self-pay | Admitting: Family Medicine

## 2015-02-24 ENCOUNTER — Telehealth: Payer: Self-pay

## 2015-02-24 NOTE — Telephone Encounter (Signed)
Patient called and was transferred for headaches post concussion. Spoke with patient who states that the headache feels better when he is lying down. He has tried tylenol without relief. Please advise recommendations.

## 2015-02-24 NOTE — Telephone Encounter (Signed)
[facility] - Client [place] Patient Name: [name] [name] DOB: March 31, 1946 Initial Comment Caller states father had concussion 3 weeks ago today he is having severe headaches and having tingling in back of head. Nurse Assessment Nurse: Markus Daft, RN, Sherre Poot Date/Time Eilene Ghazi Time): 02/24/2015 1:54:24 PM Confirm and document reason for call. If symptomatic, describe symptoms. ---Caller states father had concussion 3 weeks ago, 2 different incidences and the 2nd one he was knocked out for several minutes and then CT scan was done in the ER and it was ok. 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 Referred to neurologist to see in April. He had HA and numbness in hands which has always been there, but today he is having severe headaches and having tingling in back of head. HA is less with laying down, but with any activity it is severe. Is there anything than can be given to him til he sees specialists? Has the patient traveled out of the country within the last 30 days? ---Not Applicable Does the patient require triage? ---Yes Related visit to physician within the last 2 weeks? ---Yes Does the PT have any chronic conditions? (i.e. diabetes, asthma, etc.) ---Yes List chronic conditions. ---IDDM, CABG x 4 about 16 years ago, CAD Guidelines Guideline Title Affirmed Question Affirmed Notes Concussion (mTBI) Less Than 14 Days Ago Follow-up Call Concussion symptoms are worsening - Also unsteady after a few minutes of walking Final Disposition User Go to ED Now (or PCP triage) Markus Daft, RN, Windy No appts available, reaching back office staff to see about working in or  Going to ER.  Spoke with Abner Greenspan, nurse.

## 2015-02-24 NOTE — Telephone Encounter (Signed)
He needs to be seen if his headaches are getting worse, recommend he be seen by [place], neurology or ED to do a neurologic assessment.

## 2015-02-24 NOTE — Telephone Encounter (Signed)
Patient has been added to Tuesday schedule at 1015am. Advised that if symptoms change or worsen, to go to the ED. Patient agrees with plan.

## 2015-02-25 ENCOUNTER — Ambulatory Visit (INDEPENDENT_AMBULATORY_CARE_PROVIDER_SITE_OTHER): Payer: Medicare PPO | Admitting: Family Medicine

## 2015-02-25 ENCOUNTER — Ambulatory Visit (HOSPITAL_BASED_OUTPATIENT_CLINIC_OR_DEPARTMENT_OTHER)
Admission: RE | Admit: 2015-02-25 | Discharge: 2015-02-25 | Disposition: A | Discharge status: Home / Self Care | Payer: Medicare PPO | Source: Ambulatory Visit | Attending: Family Medicine | Admitting: Family Medicine

## 2015-02-25 ENCOUNTER — Encounter: Payer: Self-pay | Admitting: Family Medicine

## 2015-02-25 VITALS — BP 110/78 | HR 79 | Temp 98.1°F | Resp 16 | Wt 184.0 lb

## 2015-02-25 DIAGNOSIS — K219 Gastro-esophageal reflux disease without esophagitis: Secondary | ICD-10-CM

## 2015-02-25 DIAGNOSIS — H539 Unspecified visual disturbance: Secondary | ICD-10-CM | POA: Diagnosis not present

## 2015-02-25 DIAGNOSIS — W2103XD Struck by baseball, subsequent encounter: Secondary | ICD-10-CM | POA: Insufficient documentation

## 2015-02-25 DIAGNOSIS — M542 Cervicalgia: Secondary | ICD-10-CM | POA: Diagnosis not present

## 2015-02-25 DIAGNOSIS — S0990XD Unspecified injury of head, subsequent encounter: Secondary | ICD-10-CM | POA: Insufficient documentation

## 2015-02-25 DIAGNOSIS — R519 Headache, unspecified: Secondary | ICD-10-CM

## 2015-02-25 DIAGNOSIS — IMO0002 Reserved for concepts with insufficient information to code with codable children: Secondary | ICD-10-CM

## 2015-02-25 DIAGNOSIS — I10 Essential (primary) hypertension: Secondary | ICD-10-CM

## 2015-02-25 DIAGNOSIS — G44311 Acute post-traumatic headache, intractable: Secondary | ICD-10-CM

## 2015-02-25 DIAGNOSIS — R51 Headache: Secondary | ICD-10-CM

## 2015-02-25 DIAGNOSIS — E1165 Type 2 diabetes mellitus with hyperglycemia: Secondary | ICD-10-CM

## 2015-02-25 NOTE — Progress Notes (Signed)
Pre visit review using our clinic review tool, if applicable. No additional management support is needed unless otherwise documented below in the visit note. 

## 2015-02-25 NOTE — Patient Instructions (Signed)
Concussion  A concussion, or closed-head injury, is a brain injury caused by a direct blow to the head or by a quick and sudden movement (jolt) of the head or neck. Concussions are usually not life-threatening. Even so, the effects of a concussion can be serious. If you have had a concussion before, you are more likely to experience concussion-like symptoms after a direct blow to the head.   CAUSES  · Direct blow to the head, such as from running into another player during a soccer game, being hit in a fight, or hitting your head on a hard surface.  · A jolt of the head or neck that causes the brain to move back and forth inside the skull, such as in a car crash.  SIGNS AND SYMPTOMS  The signs of a concussion can be hard to notice. Early on, they may be missed by you, family members, and health care providers. You may look fine but act or feel differently.  Symptoms are usually temporary, but they may last for days, weeks, or even longer. Some symptoms may appear right away while others may not show up for hours or days. Every head injury is different. Symptoms include:  · Mild to moderate headaches that will not go away.  · A feeling of pressure inside your head.  · Having more trouble than usual:  ¨ Learning or remembering things you have heard.  ¨ Answering questions.  ¨ Paying attention or concentrating.  ¨ Organizing daily tasks.  ¨ Making decisions and solving problems.  · Slowness in thinking, acting or reacting, speaking, or reading.  · Getting lost or being easily confused.  · Feeling tired all the time or lacking energy (fatigued).  · Feeling drowsy.  · Sleep disturbances.  ¨ Sleeping more than usual.  ¨ Sleeping less than usual.  ¨ Trouble falling asleep.  ¨ Trouble sleeping (insomnia).  · Loss of balance or feeling lightheaded or dizzy.  · Nausea or vomiting.  · Numbness or tingling.  · Increased sensitivity to:  ¨ Sounds.  ¨ Lights.  ¨ Distractions.  · Vision problems or eyes that tire  easily.  · Diminished sense of taste or smell.  · Ringing in the ears.  · Mood changes such as feeling sad or anxious.  · Becoming easily irritated or angry for little or no reason.  · Lack of motivation.  · Seeing or hearing things other people do not see or hear (hallucinations).  DIAGNOSIS  Your health care provider can usually diagnose a concussion based on a description of your injury and symptoms. He or she will ask whether you passed out (lost consciousness) and whether you are having trouble remembering events that happened right before and during your injury.  Your evaluation might include:  · A brain scan to look for signs of injury to the brain. Even if the test shows no injury, you may still have a concussion.  · Blood tests to be sure other problems are not present.  TREATMENT  · Concussions are usually treated in an emergency department, in urgent care, or at a clinic. You may need to stay in the hospital overnight for further treatment.  · Tell your health care provider if you are taking any medicines, including prescription medicines, over-the-counter medicines, and natural remedies. Some medicines, such as blood thinners (anticoagulants) and aspirin, may increase the chance of complications. Also tell your health care provider whether you have had alcohol or are taking illegal drugs. This information   may affect treatment.  · Your health care provider will send you home with important instructions to follow.  · How fast you will recover from a concussion depends on many factors. These factors include how severe your concussion is, what part of your brain was injured, your age, and how healthy you were before the concussion.  · Most people with mild injuries recover fully. Recovery can take time. In general, recovery is slower in older persons. Also, persons who have had a concussion in the past or have other medical problems may find that it takes longer to recover from their current injury.  HOME  CARE INSTRUCTIONS  General Instructions  · Carefully follow the directions your health care provider gave you.  · Only take over-the-counter or prescription medicines for pain, discomfort, or fever as directed by your health care provider.  · Take only those medicines that your health care provider has approved.  · Do not drink alcohol until your health care provider says you are well enough to do so. Alcohol and certain other drugs may slow your recovery and can put you at risk of further injury.  · If it is harder than usual to remember things, write them down.  · If you are easily distracted, try to do one thing at a time. For example, do not try to watch TV while fixing dinner.  · Talk with family members or close friends when making important decisions.  · Keep all follow-up appointments. Repeated evaluation of your symptoms is recommended for your recovery.  · Watch your symptoms and tell others to do the same. Complications sometimes occur after a concussion. Older adults with a brain injury may have a higher risk of serious complications, such as a blood clot on the brain.  · Tell your teachers, school nurse, school counselor, coach, athletic trainer, or work manager about your injury, symptoms, and restrictions. Tell them about what you can or cannot do. They should watch for:  ¨ Increased problems with attention or concentration.  ¨ Increased difficulty remembering or learning new information.  ¨ Increased time needed to complete tasks or assignments.  ¨ Increased irritability or decreased ability to cope with stress.  ¨ Increased symptoms.  · Rest. Rest helps the brain to heal. Make sure you:  ¨ Get plenty of sleep at night. Avoid staying up late at night.  ¨ Keep the same bedtime hours on weekends and weekdays.  ¨ Rest during the day. Take daytime naps or rest breaks when you feel tired.  · Limit activities that require a lot of thought or concentration. These include:  ¨ Doing homework or job-related  work.  ¨ Watching TV.  ¨ Working on the computer.  · Avoid any situation where there is potential for another head injury (football, hockey, soccer, basketball, martial arts, downhill snow sports and horseback riding). Your condition will get worse every time you experience a concussion. You should avoid these activities until you are evaluated by the appropriate follow-up health care providers.  Returning To Your Regular Activities  You will need to return to your normal activities slowly, not all at once. You must give your body and brain enough time for recovery.  · Do not return to sports or other athletic activities until your health care provider tells you it is safe to do so.  · Ask your health care provider when you can drive, ride a bicycle, or operate heavy machinery. Your ability to react may be slower after a   brain injury. Never do these activities if you are dizzy.  · Ask your health care provider about when you can return to work or school.  Preventing Another Concussion  It is very important to avoid another brain injury, especially before you have recovered. In rare cases, another injury can lead to permanent brain damage, brain swelling, or death. The risk of this is greatest during the first 7-10 days after a head injury. Avoid injuries by:  · Wearing a seat belt when riding in a car.  · Drinking alcohol only in moderation.  · Wearing a helmet when biking, skiing, skateboarding, skating, or doing similar activities.  · Avoiding activities that could lead to a second concussion, such as contact or recreational sports, until your health care provider says it is okay.  · Taking safety measures in your home.  ¨ Remove clutter and tripping hazards from floors and stairways.  ¨ Use grab bars in bathrooms and handrails by stairs.  ¨ Place non-slip mats on floors and in bathtubs.  ¨ Improve lighting in dim areas.  SEEK MEDICAL CARE IF:  · You have increased problems paying attention or  concentrating.  · You have increased difficulty remembering or learning new information.  · You need more time to complete tasks or assignments than before.  · You have increased irritability or decreased ability to cope with stress.  · You have more symptoms than before.  Seek medical care if you have any of the following symptoms for more than 2 weeks after your injury:  · Lasting (chronic) headaches.  · Dizziness or balance problems.  · Nausea.  · Vision problems.  · Increased sensitivity to noise or light.  · Depression or mood swings.  · Anxiety or irritability.  · Memory problems.  · Difficulty concentrating or paying attention.  · Sleep problems.  · Feeling tired all the time.  SEEK IMMEDIATE MEDICAL CARE IF:  · You have severe or worsening headaches. These may be a sign of a blood clot in the brain.  · You have weakness (even if only in one hand, leg, or part of the face).  · You have numbness.  · You have decreased coordination.  · You vomit repeatedly.  · You have increased sleepiness.  · One pupil is larger than the other.  · You have convulsions.  · You have slurred speech.  · You have increased confusion. This may be a sign of a blood clot in the brain.  · You have increased restlessness, agitation, or irritability.  · You are unable to recognize people or places.  · You have neck pain.  · It is difficult to wake you up.  · You have unusual behavior changes.  · You lose consciousness.  MAKE SURE YOU:  · Understand these instructions.  · Will watch your condition.  · Will get help right away if you are not doing well or get worse.  Document Released: 02/12/2004 Document Revised: 11/27/2013 Document Reviewed: 06/14/2013  ExitCare® Patient Information ©2015 [employer]. This information is not intended to replace advice given to you by your health care provider. Make sure you discuss any questions you have with your health care provider.

## 2015-03-03 ENCOUNTER — Encounter: Payer: Self-pay | Admitting: Family Medicine

## 2015-03-03 NOTE — Assessment & Plan Note (Signed)
Well controlled, no changes to meds. Encouraged heart healthy diet such as the DASH diet and exercise as tolerated.  °

## 2015-03-03 NOTE — Assessment & Plan Note (Signed)
Tolerating statin, encouraged heart healthy diet, avoid trans fats, minimize simple carbs and saturated fats. Increase exercise as tolerated 

## 2015-03-03 NOTE — Assessment & Plan Note (Signed)
Avoid offending foods, start probiotics. Do not eat large meals in late evening and consider raising head of bed. May continue Omeprazole prn

## 2015-03-03 NOTE — Assessment & Plan Note (Signed)
Worsening CT ordered today showed on acute concerns or bleeding. He will continue on Topamax and establish with neurology, seek care if worsens. May use pain meds prn.

## 2015-03-03 NOTE — Assessment & Plan Note (Signed)
Has had follow up with neurology but did not start Topamax agrees to start and follow up with neurology but would like to switch practices. Was not satisfied with previous practice

## 2015-03-03 NOTE — Progress Notes (Signed)
[name] [name]  [id number] 1946/04/10 03/03/2015      Progress Note-Follow Up  Subjective  Chief Complaint  Chief Complaint  Patient presents with  . Headache    post concussion    HPI  Patient is a 69 y.o. male in today for routine medical care.  Patient is in today for concerns over headaches persisting. He has had some increased headache especially with position changes and bending. Has intermittent photophobia and nausea with increased headache. No other neurologic complaints. Denies CP/palp/SOB/congestion/fevers/GI or GU c/o. Taking meds as prescribed  Past Medical History  Diagnosis Date  . Diabetes mellitus type II, uncontrolled   . OSA (obstructive sleep apnea)   . Hyperlipidemia   . RBBB (right bundle branch block)   . Depression     Bipolar  . Edema   . Hypertension   . Falling episodes     these have occurred in the past and again recurring 2011  . Spondylosis     C5-6, C6-7 MRI 2010  . CAD (coronary artery disease)     A. CABG in 2000,status post cardiac cath in 2006, 2009 ....continued chest pain and SOB despite oral medication adjestments including Ranexa. B. Cath November 2009/ DES  Stent...11/26/2008 to distal  RCA leading to acute marginal.  C. Cath 07/2012 for CP - stable anatomy, med rx.  Marland Kitchen Hx of CABG     2000,  / one median sternotomy suture broken her chest x-ray November, 2010, no clinical significance  . Palpitations     event recorder showed sinus rhythm  . Nephrolithiasis   . Restless leg   . Dizziness   . Shortness of breath     CPX April, 2011, mild functional limitation, no clear pulmonary or cardiac limitation, possible deconditioning and mild chronotropic incompetence( peak heart rate 130)  . Cerebral ischemia     MRI November, 2010, chronic microvascular ischemia  . Anemia     hemoglobin 7.4, iron deficiency, January, 2011, 2 unit transfusion, endoscopy normal, capsule endoscopy February, 2011 no small bowel abnormalities.   Most  likely source gastric erosions, followed by GI  . H/O medication noncompliance     Due to loss of insurance  . Ejection fraction     EF 60%, echo, July 31, 2012  . Diabetes mellitus without complication   . Pain in limb 06/12/2009    Qualifier: Diagnosis of  By: [name]   . Low back pain 06/12/2009    Qualifier: Diagnosis of  By: [name]   . Thyroid disease   . Right knee pain 01/07/2015    Past Surgical History  Procedure Laterality Date  . Nasal septum surgery      UP3  . Coronary artery bypass graft      2000  . Left heart catheterization with coronary/graft angiogram N/A 08/01/2012    Procedure: LEFT HEART CATHETERIZATION WITH Beatrix Fetters;  Surgeon: [name], MD;  Location: [facility] CATH LAB;  Service: Cardiovascular;  Laterality: N/A;  . Left heart catheterization with coronary/graft angiogram N/A 01/03/2015    Procedure: LEFT HEART CATHETERIZATION WITH Beatrix Fetters;  Surgeon: [name], MD;  Location: [facility] CATH LAB;  Service: Cardiovascular;  Laterality: N/A;    Family History  Problem Relation Age of Onset  . Cancer Brother     pancreatic cancer  . Diabetes Brother     4   . Stroke Brother   . Diabetes Brother   . Cancer Brother   .  Diabetes Brother   . Coronary artery disease Brother   . Diabetes Brother   . Hypothyroidism Brother   . Heart attack      Nephew    History   Social History  . Marital Status: Married    Spouse Name: N/A  . Number of Children: 4  . Years of Education: 13   Occupational History  . retired    Social History Main Topics  . Smoking status: Never Smoker   . Smokeless tobacco: Never Used  . Alcohol Use: No  . Drug Use: No  . Sexual Activity: Not on file   Other Topics Concern  . Not on file   Social History Narrative   Patient is right handed.   Patient does not drink caffeine.    Current Outpatient Prescriptions on File Prior to Visit  Medication Sig Dispense Refill  .  BAYER CONTOUR NEXT TEST test strip   9  . clotrimazole-betamethasone (LOTRISONE) cream APPLY TO THE AFFECTED TWICE DAILY 30 g 0  . diclofenac sodium (VOLTAREN) 1 % GEL Apply 2 g topically 3 (three) times daily as needed. For pain to affected area 2 Tube 3  . furosemide (LASIX) 20 MG tablet Take 1 tablet (20 mg total) by mouth daily. 90 tablet 1  . gabapentin (NEURONTIN) 300 MG capsule Take 300 mg by mouth 3 (three) times daily.    Marland Kitchen HYDROcodone-acetaminophen (NORCO/VICODIN) 5-325 MG per tablet Take 2 tablets by mouth every 4 (four) hours as needed. 20 tablet 0  . hyoscyamine (LEVSIN, ANASPAZ) 0.125 MG tablet Take 1 tablet (0.125 mg total) by mouth every 8 (eight) hours as needed for cramping. 30 tablet 0  . insulin aspart (NOVOLOG) 100 UNIT/ML injection Use 10-17 unit 3 times daily before meals 15 mL 2  . Insulin Glargine (TOUJEO SOLOSTAR) 300 UNIT/ML SOPN Inject 42 Units into the skin at bedtime. 3 pen 2  . isosorbide mononitrate (IMDUR) 120 MG 24 hr tablet Take 1 tablet (120 mg total) by mouth daily. 90 tablet 1  . levothyroxine (SYNTHROID, LEVOTHROID) 75 MCG tablet Take 1 tablet (75 mcg total) by mouth daily. 90 tablet 1  . metFORMIN (GLUCOPHAGE) 1000 MG tablet Take 1 tablet (1,000 mg total) by mouth 2 (two) times daily with a meal. Please resume on 01/05/15 180 tablet 1  . nitroGLYCERIN (NITROSTAT) 0.4 MG SL tablet Place 1 tablet (0.4 mg total) under the tongue every 5 (five) minutes as needed (up to 3 doses). 25 tablet 4  . NOVOLOG FLEXPEN 100 UNIT/ML FlexPen   3  . pantoprazole (PROTONIX) 40 MG tablet Take twice daily for 2 weeks to see if it helps the symptoms. 180 tablet 1  . pramipexole (MIRAPEX) 0.25 MG tablet Take 1 tablet (0.25 mg total) by mouth at bedtime. 90 tablet 1  . sertraline (ZOLOFT) 100 MG tablet Take 1 tablet (100 mg total) by mouth 2 (two) times daily. 180 tablet 1  . simvastatin (ZOCOR) 40 MG tablet Take 1 tablet (40 mg total) by mouth every evening. 90 tablet 1  .  topiramate (TOPAMAX) 25 MG tablet TAKE 1 TABLET BY MOUTH AT NIGHT FOR 1 WEEK THEN TAKE 2 TABLETS AT NIGHT 158 tablet 1  . zolpidem (AMBIEN) 5 MG tablet Take 1 tablet (5 mg total) by mouth at bedtime as needed for sleep. 30 tablet 3  . aspirin EC 325 MG EC tablet Take 1 tablet (325 mg total) by mouth daily. (Patient not taking: Reported on 02/25/2015) 30 tablet 2  No current facility-administered medications on file prior to visit.    Allergies  Allergen Reactions  . Morphine     REACTION: hallucinations  . Shellfish-Derived Products Itching    Review of Systems  Review of Systems  Constitutional: Negative for fever and malaise/fatigue.  HENT: Negative for congestion.   Eyes: Positive for photophobia. Negative for discharge.  Respiratory: Negative for shortness of breath.   Cardiovascular: Negative for chest pain, palpitations and leg swelling.  Gastrointestinal: Positive for nausea. Negative for abdominal pain and diarrhea.  Genitourinary: Negative for dysuria.  Musculoskeletal: Negative for falls.  Skin: Negative for rash.  Neurological: Positive for headaches. Negative for loss of consciousness.  Endo/Heme/Allergies: Negative for polydipsia.  Psychiatric/Behavioral: Negative for depression and suicidal ideas. The patient is not nervous/anxious and does not have insomnia.     Objective  BP 110/78 mmHg  Pulse 79  Temp(Src) 98.1 F (36.7 C) (Oral)  Resp 16  Wt 184 lb (83.462 kg)  SpO2 96%  Physical Exam  Physical Exam  Constitutional: He is oriented to person, place, and time and well-developed, well-nourished, and in no distress. No distress.  HENT:  Head: Normocephalic and atraumatic.  Eyes: Conjunctivae are normal.  Neck: Neck supple. No thyromegaly present.  Cardiovascular: Normal rate, regular rhythm and normal heart sounds.   No murmur heard. Pulmonary/Chest: Effort normal and breath sounds normal. No respiratory distress.  Abdominal: He exhibits no distension  and no mass. There is no tenderness.  Musculoskeletal: He exhibits no edema.  Neurological: He is alert and oriented to person, place, and time. He has normal reflexes. No cranial nerve deficit. He exhibits normal muscle tone. Coordination normal. GCS score is 15.  Skin: Skin is warm.  Psychiatric: Memory, affect and judgment normal.    Lab Results  Component Value Date   TSH 1.464 11/22/2014   Lab Results  Component Value Date   WBC 7.9 02/12/2015   HGB 14.0 02/12/2015   HCT 39.5 02/12/2015   MCV 86.1 02/12/2015   PLT 193 02/12/2015   Lab Results  Component Value Date   CREATININE 0.91 02/12/2015   BUN 16 02/12/2015   NA 139 02/12/2015   K 3.6 02/12/2015   CL 103 02/12/2015   CO2 28 02/12/2015   Lab Results  Component Value Date   ALT 17 11/22/2014   AST 16 11/22/2014   ALKPHOS 78 11/22/2014   BILITOT 1.3* 11/22/2014   Lab Results  Component Value Date   CHOL 173 01/03/2015   Lab Results  Component Value Date   HDL 31* 01/03/2015   Lab Results  Component Value Date   LDLCALC 102* 01/03/2015   Lab Results  Component Value Date   TRIG 198* 01/03/2015   Lab Results  Component Value Date   CHOLHDL 5.6 01/03/2015     Assessment & Plan  Hypertension Well controlled, no changes to meds. Encouraged heart healthy diet such as the DASH diet and exercise as tolerated.    Diabetes mellitus type II, uncontrolled Improving with the increase in insulin   Post-traumatic headache Worsening CT ordered today showed on acute concerns or bleeding. He will continue on Topamax and establish with neurology, seek care if worsens. May use pain meds prn.   GERD Avoid offending foods, start probiotics. Do not eat large meals in late evening and consider raising head of bed. May continue Omeprazole prn

## 2015-03-03 NOTE — Assessment & Plan Note (Signed)
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 Took one blow to the side of the mask, he has struggled with worsening headache and some nausea. Since. He was started on Topamax by neurology but did not start it, he agrees to start it today. He will follow up with neurology. Encouraged increased hydration, 64 ounces of clear fluids daily. Minimize alcohol and caffeine. Eat small frequent meals with lean proteins and complex carbs. Avoid high and low blood sugars. Get adequate sleep, 7-8 hours a night. Needs exercise daily preferably in the morning. Seek care if worsens.

## 2015-03-03 NOTE — Assessment & Plan Note (Signed)
Improving with the increase in insulin

## 2015-03-03 NOTE — Progress Notes (Signed)
[name] LINSEY  [id number] [birth year] 03/03/2015      Progress Note-Follow Up  Subjective  Chief Complaint  Chief Complaint  Patient presents with  . Follow-up    6 weeks    HPI  Patient is a 69 y.o. male in today for routine medical care.  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 He took 2 blows to the face mask, continues to struggle with photophobia, nausea and headache. Has seen neurology once since hospitalization but has not started the Topamax they suggested. His sugars continue to be labile. As low as 73 and as hi as 400. No polyuria or polydipsia. Denies CP/palp/SOB/congestion/fevers/GI or GU c/o. Taking meds as prescribed  Past Medical History  Diagnosis Date  . Diabetes mellitus type II, uncontrolled   . OSA (obstructive sleep apnea)   . Hyperlipidemia   . RBBB (right bundle branch block)   . Depression     Bipolar  . Edema   . Hypertension   . Falling episodes     these have occurred in the past and again recurring 2011  . Spondylosis     C5-6, C6-7 MRI 2010  . CAD (coronary artery disease)     A. CABG in 2000,status post cardiac cath in 2006, 2009 ....continued chest pain and SOB despite oral medication adjestments including Ranexa. B. Cath November 2009/ DES  Stent...11/26/2008 to distal  RCA leading to acute marginal.  C. Cath 07/2012 for CP - stable anatomy, med rx.  Marland Kitchen Hx of CABG     2000,  / one median sternotomy suture broken her chest x-ray November, 2010, no clinical significance  . Palpitations     event recorder showed sinus rhythm  . Nephrolithiasis   . Restless leg   . Dizziness   . Shortness of breath     CPX April, 2011, mild functional limitation, no clear pulmonary or cardiac limitation, possible deconditioning and mild chronotropic incompetence( peak heart rate 130)  . Cerebral ischemia     MRI November, 2010, chronic microvascular ischemia  . Anemia     hemoglobin 7.4, iron deficiency,  January, 2011, 2 unit transfusion, endoscopy normal, capsule endoscopy February, 2011 no small bowel abnormalities.   Most likely source gastric erosions, followed by GI  . H/O medication noncompliance     Due to loss of insurance  . Ejection fraction     EF 60%, echo, July 31, 2012  . Diabetes mellitus without complication   . Pain in limb 06/12/2009    Qualifier: Diagnosis of  By: [name]   . Low back pain 06/12/2009    Qualifier: Diagnosis of  By: [name]   . Thyroid disease   . Right knee pain 01/07/2015    Past Surgical History  Procedure Laterality Date  . Nasal septum surgery      UP3  . Coronary artery bypass graft      2000  . Left heart catheterization with coronary/graft angiogram N/A 08/01/2012    Procedure: LEFT HEART CATHETERIZATION WITH Beatrix Fetters;  Surgeon: [name], MD;  Location: [facility] CATH LAB;  Service: Cardiovascular;  Laterality: N/A;  . Left heart catheterization with coronary/graft angiogram N/A 01/03/2015    Procedure: LEFT HEART CATHETERIZATION WITH Beatrix Fetters;  Surgeon: [name], MD;  Location: [facility] CATH LAB;  Service: Cardiovascular;  Laterality: N/A;    Family History  Problem Relation Age of Onset  . Cancer  Brother     pancreatic cancer  . Diabetes Brother     4   . Stroke Brother   . Diabetes Brother   . Cancer Brother   . Diabetes Brother   . Coronary artery disease Brother   . Diabetes Brother   . Hypothyroidism Brother   . Heart attack      Nephew    History   Social History  . Marital Status: Married    Spouse Name: N/A  . Number of Children: 4  . Years of Education: 13   Occupational History  . retired    Social History Main Topics  . Smoking status: Never Smoker   . Smokeless tobacco: Never Used  . Alcohol Use: No  . Drug Use: No  . Sexual Activity: Not on file   Other Topics Concern  . Not on file   Social History Narrative   Patient is right handed.   Patient  does not drink caffeine.    Current Outpatient Prescriptions on File Prior to Visit  Medication Sig Dispense Refill  . aspirin EC 325 MG EC tablet Take 1 tablet (325 mg total) by mouth daily. (Patient not taking: Reported on 02/25/2015) 30 tablet 2  . BAYER CONTOUR NEXT TEST test strip   9  . clotrimazole-betamethasone (LOTRISONE) cream APPLY TO THE AFFECTED TWICE DAILY 30 g 0  . diclofenac sodium (VOLTAREN) 1 % GEL Apply 2 g topically 3 (three) times daily as needed. For pain to affected area 2 Tube 3  . furosemide (LASIX) 20 MG tablet Take 1 tablet (20 mg total) by mouth daily. 90 tablet 1  . gabapentin (NEURONTIN) 300 MG capsule Take 300 mg by mouth 3 (three) times daily.    Marland Kitchen HYDROcodone-acetaminophen (NORCO/VICODIN) 5-325 MG per tablet Take 2 tablets by mouth every 4 (four) hours as needed. 20 tablet 0  . hyoscyamine (LEVSIN, ANASPAZ) 0.125 MG tablet Take 1 tablet (0.125 mg total) by mouth every 8 (eight) hours as needed for cramping. 30 tablet 0  . insulin aspart (NOVOLOG) 100 UNIT/ML injection Use 10-17 unit 3 times daily before meals 15 mL 2  . isosorbide mononitrate (IMDUR) 120 MG 24 hr tablet Take 1 tablet (120 mg total) by mouth daily. 90 tablet 1  . levothyroxine (SYNTHROID, LEVOTHROID) 75 MCG tablet Take 1 tablet (75 mcg total) by mouth daily. 90 tablet 1  . metFORMIN (GLUCOPHAGE) 1000 MG tablet Take 1 tablet (1,000 mg total) by mouth 2 (two) times daily with a meal. Please resume on 01/05/15 180 tablet 1  . nitroGLYCERIN (NITROSTAT) 0.4 MG SL tablet Place 1 tablet (0.4 mg total) under the tongue every 5 (five) minutes as needed (up to 3 doses). 25 tablet 4  . NOVOLOG FLEXPEN 100 UNIT/ML FlexPen   3  . pantoprazole (PROTONIX) 40 MG tablet Take twice daily for 2 weeks to see if it helps the symptoms. 180 tablet 1  . pramipexole (MIRAPEX) 0.25 MG tablet Take 1 tablet (0.25 mg total) by mouth at bedtime. 90 tablet 1  . sertraline (ZOLOFT) 100 MG tablet Take 1 tablet (100 mg total) by  mouth 2 (two) times daily. 180 tablet 1  . simvastatin (ZOCOR) 40 MG tablet Take 1 tablet (40 mg total) by mouth every evening. 90 tablet 1  . zolpidem (AMBIEN) 5 MG tablet Take 1 tablet (5 mg total) by mouth at bedtime as needed for sleep. 30 tablet 3   No current facility-administered medications on file prior to visit.  Allergies  Allergen Reactions  . Morphine     REACTION: hallucinations  . Shellfish-Derived Products Itching    Review of Systems  Review of Systems  Constitutional: Positive for malaise/fatigue. Negative for fever.  HENT: Negative for congestion.   Eyes: Positive for photophobia. Negative for discharge.  Respiratory: Negative for shortness of breath.   Cardiovascular: Negative for chest pain, palpitations and leg swelling.  Gastrointestinal: Positive for nausea. Negative for abdominal pain and diarrhea.  Genitourinary: Negative for dysuria.  Musculoskeletal: Negative for falls.  Skin: Negative for rash.  Neurological: Positive for headaches. Negative for loss of consciousness.  Endo/Heme/Allergies: Negative for polydipsia.  Psychiatric/Behavioral: Negative for depression and suicidal ideas. The patient is not nervous/anxious and does not have insomnia.     Objective  BP 128/62 mmHg  Pulse 86  Temp(Src) 98.2 F (36.8 C) (Oral)  Ht 5\' 9"  (1.753 m)  Wt 184 lb 3.2 oz (83.553 kg)  BMI 27.19 kg/m2  SpO2 97%  Physical Exam  Physical Exam  Constitutional: He is oriented to person, place, and time and well-developed, well-nourished, and in no distress. No distress.  HENT:  Head: Normocephalic and atraumatic.  Eyes: Conjunctivae are normal.  Neck: Neck supple. No thyromegaly present.  Cardiovascular: Normal rate, regular rhythm and normal heart sounds.   No murmur heard. Pulmonary/Chest: Effort normal and breath sounds normal. No respiratory distress.  Abdominal: He exhibits no distension and no mass. There is no tenderness.  Musculoskeletal: He  exhibits no edema.  Neurological: He is alert and oriented to person, place, and time. He has normal reflexes. No cranial nerve deficit. He exhibits normal muscle tone. Coordination normal. GCS score is 15.  Skin: Skin is warm.  Psychiatric: Memory, affect and judgment normal.    Lab Results  Component Value Date   TSH 1.464 11/22/2014   Lab Results  Component Value Date   WBC 7.9 02/12/2015   HGB 14.0 02/12/2015   HCT 39.5 02/12/2015   MCV 86.1 02/12/2015   PLT 193 02/12/2015   Lab Results  Component Value Date   CREATININE 0.91 02/12/2015   BUN 16 02/12/2015   NA 139 02/12/2015   K 3.6 02/12/2015   CL 103 02/12/2015   CO2 28 02/12/2015   Lab Results  Component Value Date   ALT 17 11/22/2014   AST 16 11/22/2014   ALKPHOS 78 11/22/2014   BILITOT 1.3* 11/22/2014   Lab Results  Component Value Date   CHOL 173 01/03/2015   Lab Results  Component Value Date   HDL 31* 01/03/2015   Lab Results  Component Value Date   LDLCALC 102* 01/03/2015   Lab Results  Component Value Date   TRIG 198* 01/03/2015   Lab Results  Component Value Date   CHOLHDL 5.6 01/03/2015     Assessment & Plan  Hypertension Well controlled, no changes to meds. Encouraged heart healthy diet such as the DASH diet and exercise as tolerated.    Hypothyroidism On Levothyroxine, continue to monitor   Hyperlipidemia Tolerating statin, encouraged heart healthy diet, avoid trans fats, minimize simple carbs and saturated fats. 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 Took one blow to the side of the mask, he has struggled with worsening headache and some nausea. Since. He was started on Topamax by neurology but did not start it, he agrees to start it today. He will follow up with neurology. Encouraged increased  hydration, 64 ounces of clear fluids daily. Minimize alcohol and caffeine. Eat  small frequent meals with lean proteins and complex carbs. Avoid high and low blood sugars. Get adequate sleep, 7-8 hours a night. Needs exercise daily preferably in the morning. Seek care if worsens.   Diabetes mellitus type II, uncontrolled Increase TUojeo to 42 units and increase Novolog by 2 units each dose as needed. Minimize simple carbs.   Post-traumatic headache Has had follow up with neurology but did not start Topamax agrees to start and follow up with neurology but would like to switch practices. Was not satisfied with previous practice

## 2015-03-03 NOTE — Assessment & Plan Note (Signed)
Increase TUojeo to 42 units and increase Novolog by 2 units each dose as needed. Minimize simple carbs.

## 2015-03-03 NOTE — Assessment & Plan Note (Signed)
On Levothyroxine, continue to monitor 

## 2015-03-12 ENCOUNTER — Encounter: Payer: Self-pay | Admitting: Neurology

## 2015-03-12 ENCOUNTER — Ambulatory Visit (INDEPENDENT_AMBULATORY_CARE_PROVIDER_SITE_OTHER): Payer: Medicare PPO | Admitting: Neurology

## 2015-03-12 VITALS — BP 122/54 | HR 84 | Resp 20 | Ht 69.0 in | Wt 185.9 lb

## 2015-03-12 DIAGNOSIS — G44309 Post-traumatic headache, unspecified, not intractable: Secondary | ICD-10-CM

## 2015-03-12 DIAGNOSIS — M5481 Occipital neuralgia: Secondary | ICD-10-CM

## 2015-03-12 DIAGNOSIS — S060X0A Concussion without loss of consciousness, initial encounter: Secondary | ICD-10-CM | POA: Diagnosis not present

## 2015-03-12 MED ORDER — TIZANIDINE HCL 2 MG PO TABS: ORAL_TABLET | ORAL | Status: DC

## 2015-03-12 NOTE — Progress Notes (Signed)
NEUROLOGY CONSULTATION NOTE  [name] [name] MRN: [id number] DOB: November 13, 1946  Referring provider: Dr. Charlett Blake Primary care provider: Dr. Charlett Blake  Reason for consult:  Headache  HISTORY OF PRESENT ILLNESS: [name] [name] is a 69 year old right-handed man with type 2 diabetes mellitus, OSA, hyperlipidemia, coronary artery disease, and hypertension who presents for headache and increased falls.  Records, both CT of the head, CT of cervical spine and labs reviewed.  He is accompanied by his daughter who provides some history.  Last month, he was struck several times in the face mask while playing catcher in a baseball game over the course of two games.   At least one of the times, he was hit in the left eye.  He doesn't think he lost consciousness, but it did knock him down to the ground.  The next day, he began to feel nauseous and diaphoretic.  He went home and passed out of the bed.  EMS was called and he was brought to the ED, where CT of the head was unremarkable.  CT of the cervical spine showed C6-7 degenerative disc disease but otherwise unremarkable.  He also developed a headache.  It starts in the back of his head and travels up to the forehead.  It is a "hurting" pain and his eyes hurt.  It can be anywhere from 3 to 9/10.  It lasts from 30 to 60 minutes and occurs three times a day.  It is associated with nausea, photophobia, phonophobia and some blurred vision.  It causes him to stagger.  It is not positional.  He also notes pain in the right suboccipital region, which causes numbness and tingling traveling up the back of his head.  He notes that his neck feels stiff.  He saw a neurologist who prescribed Topamax.  He has been on 50mg  at bedtime for about 1.5 weeks.  He has not been taking any over the counter medications.  Due to persistent headache, he had another CT of the head performed on 02/26/15, which was stable.  He has worked as a Environmental education officer for 30 years and has been hit in  the face mask before.  He also played football in his youth.  He has no prior history of headache.  He takes gabapentin for neuralgia in his hands.  He takes sertraline 100mg  for depression.  PAST MEDICAL HISTORY: Past Medical History  Diagnosis Date  . Diabetes mellitus type II, uncontrolled   . OSA (obstructive sleep apnea)   . Hyperlipidemia   . RBBB (right bundle branch block)   . Depression     Bipolar  . Edema   . Hypertension   . Falling episodes     these have occurred in the past and again recurring 2011  . Spondylosis     C5-6, C6-7 MRI 2010  . CAD (coronary artery disease)     A. CABG in 2000,status post cardiac cath in 2006, 2009 ....continued chest pain and SOB despite oral medication adjestments including Ranexa. B. Cath November 2009/ DES  Stent...11/26/2008 to distal  RCA leading to acute marginal.  C. Cath 07/2012 for CP - stable anatomy, med rx.  Marland Kitchen Hx of CABG     2000,  / one median sternotomy suture broken her chest x-ray November, 2010, no clinical significance  . Palpitations     event recorder showed sinus rhythm  . Nephrolithiasis   . Restless leg   . Dizziness   . Shortness of breath  CPX April, 2011, mild functional limitation, no clear pulmonary or cardiac limitation, possible deconditioning and mild chronotropic incompetence( peak heart rate 130)  . Cerebral ischemia     MRI November, 2010, chronic microvascular ischemia  . Anemia     hemoglobin 7.4, iron deficiency, January, 2011, 2 unit transfusion, endoscopy normal, capsule endoscopy February, 2011 no small bowel abnormalities.   Most likely source gastric erosions, followed by GI  . H/O medication noncompliance     Due to loss of insurance  . Ejection fraction     EF 60%, echo, July 31, 2012  . Diabetes mellitus without complication   . Pain in limb 06/12/2009    Qualifier: Diagnosis of  By: [name]   . Low back pain 06/12/2009    Qualifier: Diagnosis of  By: [name]   .  Thyroid disease   . Right knee pain 01/07/2015    PAST SURGICAL HISTORY: Past Surgical History  Procedure Laterality Date  . Nasal septum surgery      UP3  . Coronary artery bypass graft      2000  . Left heart catheterization with coronary/graft angiogram N/A 08/01/2012    Procedure: LEFT HEART CATHETERIZATION WITH Beatrix Fetters;  Surgeon: [name], MD;  Location: [facility] CATH LAB;  Service: Cardiovascular;  Laterality: N/A;  . Left heart catheterization with coronary/graft angiogram N/A 01/03/2015    Procedure: LEFT HEART CATHETERIZATION WITH Beatrix Fetters;  Surgeon: [name], MD;  Location: [facility] CATH LAB;  Service: Cardiovascular;  Laterality: N/A;    MEDICATIONS: Current Outpatient Prescriptions on File Prior to Visit  Medication Sig Dispense Refill  . aspirin EC 325 MG EC tablet Take 1 tablet (325 mg total) by mouth daily. 30 tablet 2  . BAYER CONTOUR NEXT TEST test strip   9  . clotrimazole-betamethasone (LOTRISONE) cream APPLY TO THE AFFECTED TWICE DAILY 30 g 0  . diclofenac sodium (VOLTAREN) 1 % GEL Apply 2 g topically 3 (three) times daily as needed. For pain to affected area 2 Tube 3  . furosemide (LASIX) 20 MG tablet Take 1 tablet (20 mg total) by mouth daily. 90 tablet 1  . gabapentin (NEURONTIN) 300 MG capsule Take 300 mg by mouth 3 (three) times daily.    Marland Kitchen HYDROcodone-acetaminophen (NORCO/VICODIN) 5-325 MG per tablet Take 2 tablets by mouth every 4 (four) hours as needed. 20 tablet 0  . hyoscyamine (LEVSIN, ANASPAZ) 0.125 MG tablet Take 1 tablet (0.125 mg total) by mouth every 8 (eight) hours as needed for cramping. 30 tablet 0  . insulin aspart (NOVOLOG) 100 UNIT/ML injection Use 10-17 unit 3 times daily before meals 15 mL 2  . Insulin Glargine (TOUJEO SOLOSTAR) 300 UNIT/ML SOPN Inject 42 Units into the skin at bedtime. 3 pen 2  . isosorbide mononitrate (IMDUR) 120 MG 24 hr tablet Take 1 tablet (120 mg total) by mouth daily. 90 tablet 1  .  levothyroxine (SYNTHROID, LEVOTHROID) 75 MCG tablet Take 1 tablet (75 mcg total) by mouth daily. 90 tablet 1  . metFORMIN (GLUCOPHAGE) 1000 MG tablet Take 1 tablet (1,000 mg total) by mouth 2 (two) times daily with a meal. Please resume on 01/05/15 180 tablet 1  . nitroGLYCERIN (NITROSTAT) 0.4 MG SL tablet Place 1 tablet (0.4 mg total) under the tongue every 5 (five) minutes as needed (up to 3 doses). 25 tablet 4  . NOVOLOG FLEXPEN 100 UNIT/ML FlexPen   3  . pantoprazole (PROTONIX) 40 MG tablet Take  twice daily for 2 weeks to see if it helps the symptoms. 180 tablet 1  . pramipexole (MIRAPEX) 0.25 MG tablet Take 1 tablet (0.25 mg total) by mouth at bedtime. 90 tablet 1  . sertraline (ZOLOFT) 100 MG tablet Take 1 tablet (100 mg total) by mouth 2 (two) times daily. 180 tablet 1  . simvastatin (ZOCOR) 40 MG tablet Take 1 tablet (40 mg total) by mouth every evening. 90 tablet 1  . topiramate (TOPAMAX) 25 MG tablet TAKE 1 TABLET BY MOUTH AT NIGHT FOR 1 WEEK THEN TAKE 2 TABLETS AT NIGHT 158 tablet 1  . zolpidem (AMBIEN) 5 MG tablet Take 1 tablet (5 mg total) by mouth at bedtime as needed for sleep. 30 tablet 3   No current facility-administered medications on file prior to visit.    ALLERGIES: Allergies  Allergen Reactions  . Morphine     REACTION: hallucinations  . Shellfish-Derived Products Itching    FAMILY HISTORY: Family History  Problem Relation Age of Onset  . Cancer Brother     pancreatic cancer  . Diabetes Brother     4   . Stroke Brother   . Diabetes Brother   . Cancer Brother   . Diabetes Brother   . Coronary artery disease Brother   . Diabetes Brother   . Hypothyroidism Brother   . Heart attack      Nephew  . Diabetes Mother   . Heart failure Mother   . Heart failure Father   . Irregular heart beat Daughter   . Cancer Maternal Grandmother     unknown     SOCIAL HISTORY: History   Social History  . Marital Status: Married    Spouse Name: N/A  . Number of  Children: 4  . Years of Education: 13   Occupational History  . retired    Social History Main Topics  . Smoking status: Never Smoker   . Smokeless tobacco: Never Used  . Alcohol Use: No  . Drug Use: No  . Sexual Activity:    Partners: Female   Other Topics Concern  . Not on file   Social History Narrative   Patient is right handed.   Patient does not drink caffeine.    REVIEW OF SYSTEMS: Constitutional: No fevers, chills, or sweats, no generalized fatigue, change in appetite Eyes: No visual changes, double vision, eye pain Ear, nose and throat: No hearing loss, ear pain, nasal congestion, sore throat Cardiovascular: No chest pain, palpitations Respiratory:  No shortness of breath at rest or with exertion, wheezes GastrointestinaI: No nausea, vomiting, diarrhea, abdominal pain, fecal incontinence Genitourinary:  No dysuria, urinary retention or frequency Musculoskeletal:  No neck pain, back pain Integumentary: No rash, pruritus, skin lesions Neurological: as above Psychiatric: No depression, insomnia, anxiety Endocrine: No palpitations, fatigue, diaphoresis, mood swings, change in appetite, change in weight, increased thirst Hematologic/Lymphatic:  No anemia, purpura, petechiae. Allergic/Immunologic: no itchy/runny eyes, nasal congestion, recent allergic reactions, rashes  PHYSICAL EXAM: Filed Vitals:   03/12/15 1420  BP: 122/54  Pulse: 84  Resp: 20   General: No acute distress Head:  Normocephalic/atraumatic Eyes:  fundi unremarkable, without vessel changes, exudates, hemorrhages or papilledema. Neck: supple, no paraspinal tenderness, full range of motion, tenderness to palpation at the right occipital notch. Back: No paraspinal tenderness Heart: regular rate and rhythm Lungs: Clear to auscultation bilaterally. Vascular: No carotid bruits. Neurological Exam: Mental status: alert and oriented to person, place, and time, recent and remote memory intact, fund of  knowledge intact, attention and concentration intact, speech fluent and not dysarthric, language intact. Cranial nerves: CN I: not tested CN II: pupils equal, round and reactive to light, visual fields intact, fundi unremarkable, without vessel changes, exudates, hemorrhages or papilledema. CN III, IV, VI:  full range of motion, no nystagmus, no ptosis CN V: facial sensation intact CN VII: upper and lower face symmetric CN VIII: hearing intact CN IX, X: gag intact, uvula midline CN XI: sternocleidomastoid and trapezius muscles intact CN XII: tongue midline Bulk & Tone: normal, no fasciculations. Motor:  5/5 throughout Sensation:  Temperature and vibration intact Deep Tendon Reflexes:  2+ throughout, toes downgoing Finger to nose testing:  No dysmetria Heel to shin:  No dysmetria Gait:  Normal station and stride.  Able to turn and walk in tandem. Romberg negative.  IMPRESSION: Post-concussive migraines Right occipital neuralgia  PLAN: 1.  Advised to continue topiramate 50mg  at bedtime for now. 2.  Will add tizanidine 2mg  to 4mg  at bedtime. 3.  Will contact us in 2 weeks.  If headaches persist, will set up for occipital nerve block 4.  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 5.  Follow up in 6 weeks.  Thank you for allowing me to take part in the care of this patient.  Metta Clines, DO  CC:  Penni Homans, MD

## 2015-03-12 NOTE — Patient Instructions (Signed)
1.  Continue topamax 50mg  at bedtime 2.  Start tizanidine (a muscle relaxant).  Take 1 tablet at bedtime.  If not effective, then may take 2 tablets at bedtime.   3.  Call in 2 weeks with update.  If headaches persist, we can schedule for occipital nerve block 4.  Follow up in 6 weeks.

## 2015-03-24 ENCOUNTER — Other Ambulatory Visit: Payer: Self-pay | Admitting: *Deleted

## 2015-03-24 ENCOUNTER — Telehealth: Payer: Self-pay | Admitting: *Deleted

## 2015-03-24 NOTE — Telephone Encounter (Signed)
Patient would like to come in for a nerve block per his conversation at his last visit is it ok to schedule for this and any special time or date for it  Call back number [phone] Torrie Mayers)

## 2015-03-24 NOTE — Telephone Encounter (Signed)
I scheduled him  For may the 10 at 3:30 pm  Patient is aware

## 2015-03-26 ENCOUNTER — Ambulatory Visit (INDEPENDENT_AMBULATORY_CARE_PROVIDER_SITE_OTHER): Payer: Medicare PPO | Admitting: Neurology

## 2015-03-26 DIAGNOSIS — M5481 Occipital neuralgia: Secondary | ICD-10-CM | POA: Diagnosis not present

## 2015-03-26 MED ORDER — BUPIVACAINE HCL (PF) 0.25 % IJ SOLN
30.0000 mL | Freq: Once | INTRAMUSCULAR | Status: AC
  Administered 2015-03-26: 30 mL

## 2015-03-26 MED ORDER — TRIAMCINOLONE ACETONIDE 40 MG/ML IJ SUSP
40.0000 mg | Freq: Once | INTRAMUSCULAR | Status: AC
  Administered 2015-03-26: 40 mg

## 2015-03-26 NOTE — Progress Notes (Signed)
See procedure note.

## 2015-03-26 NOTE — Procedures (Signed)
Procedure: Occipital nerve block  Procedure risks, benefits and alternative treatments explained to patient and they agree to proceed.   A concentration of 0.25% bupivacaine (3 ml) was mixed with 40 mg of Kenalog (1 ml). A 27 gauge needle was used for injection. The region of the right greater occipital nerve was located by palpation. The area was prepped with alcohol. A total of 2 cc of the above mixture was injected without difficulty. The patient tolerated the procedure well.    Sharlee Rufino R. [name], DO

## 2015-03-28 ENCOUNTER — Emergency Department (HOSPITAL_BASED_OUTPATIENT_CLINIC_OR_DEPARTMENT_OTHER): Payer: Medicare PPO

## 2015-03-28 ENCOUNTER — Emergency Department (HOSPITAL_BASED_OUTPATIENT_CLINIC_OR_DEPARTMENT_OTHER)
Admission: EM | Admit: 2015-03-28 | Discharge: 2015-03-28 | Disposition: A | Discharge status: Home / Self Care | Payer: Medicare PPO | Attending: Emergency Medicine | Admitting: Emergency Medicine

## 2015-03-28 ENCOUNTER — Telehealth: Payer: Self-pay | Admitting: Neurology

## 2015-03-28 ENCOUNTER — Encounter (HOSPITAL_BASED_OUTPATIENT_CLINIC_OR_DEPARTMENT_OTHER): Payer: Self-pay | Admitting: Emergency Medicine

## 2015-03-28 ENCOUNTER — Telehealth: Payer: Self-pay | Admitting: *Deleted

## 2015-03-28 DIAGNOSIS — E785 Hyperlipidemia, unspecified: Secondary | ICD-10-CM | POA: Diagnosis not present

## 2015-03-28 DIAGNOSIS — Z79899 Other long term (current) drug therapy: Secondary | ICD-10-CM | POA: Insufficient documentation

## 2015-03-28 DIAGNOSIS — Z951 Presence of aortocoronary bypass graft: Secondary | ICD-10-CM | POA: Diagnosis not present

## 2015-03-28 DIAGNOSIS — R739 Hyperglycemia, unspecified: Secondary | ICD-10-CM

## 2015-03-28 DIAGNOSIS — Z794 Long term (current) use of insulin: Secondary | ICD-10-CM | POA: Diagnosis not present

## 2015-03-28 DIAGNOSIS — E079 Disorder of thyroid, unspecified: Secondary | ICD-10-CM | POA: Insufficient documentation

## 2015-03-28 DIAGNOSIS — Z9119 Patient's noncompliance with other medical treatment and regimen: Secondary | ICD-10-CM | POA: Insufficient documentation

## 2015-03-28 DIAGNOSIS — I2510 Atherosclerotic heart disease of native coronary artery without angina pectoris: Secondary | ICD-10-CM | POA: Diagnosis not present

## 2015-03-28 DIAGNOSIS — Z791 Long term (current) use of non-steroidal anti-inflammatories (NSAID): Secondary | ICD-10-CM | POA: Insufficient documentation

## 2015-03-28 DIAGNOSIS — Z87442 Personal history of urinary calculi: Secondary | ICD-10-CM | POA: Diagnosis not present

## 2015-03-28 DIAGNOSIS — Z8669 Personal history of other diseases of the nervous system and sense organs: Secondary | ICD-10-CM | POA: Insufficient documentation

## 2015-03-28 DIAGNOSIS — Z7982 Long term (current) use of aspirin: Secondary | ICD-10-CM | POA: Insufficient documentation

## 2015-03-28 DIAGNOSIS — R410 Disorientation, unspecified: Secondary | ICD-10-CM | POA: Insufficient documentation

## 2015-03-28 DIAGNOSIS — I10 Essential (primary) hypertension: Secondary | ICD-10-CM | POA: Insufficient documentation

## 2015-03-28 DIAGNOSIS — E1165 Type 2 diabetes mellitus with hyperglycemia: Secondary | ICD-10-CM | POA: Diagnosis not present

## 2015-03-28 DIAGNOSIS — R42 Dizziness and giddiness: Secondary | ICD-10-CM | POA: Diagnosis present

## 2015-03-28 LAB — COMPREHENSIVE METABOLIC PANEL
ALT: 19 U/L (ref 0–53)
ANION GAP: 12 (ref 5–15)
AST: 19 U/L (ref 0–37)
Albumin: 4.7 g/dL (ref 3.5–5.2)
Alkaline Phosphatase: 110 U/L (ref 39–117)
BILIRUBIN TOTAL: 0.7 mg/dL (ref 0.3–1.2)
BUN: 24 mg/dL — ABNORMAL HIGH (ref 6–23)
CHLORIDE: 97 mmol/L (ref 96–112)
CO2: 22 mmol/L (ref 19–32)
Calcium: 9 mg/dL (ref 8.4–10.5)
Creatinine, Ser: 1.29 mg/dL (ref 0.50–1.35)
GFR calc non Af Amer: 56 mL/min — ABNORMAL LOW (ref >90)
GFR, EST AFRICAN AMERICAN: 65 mL/min — AB (ref >90)
Glucose, Bld: 417 mg/dL — ABNORMAL HIGH (ref 70–99)
Potassium: 4.4 mmol/L (ref 3.5–5.1)
Sodium: 131 mmol/L — ABNORMAL LOW (ref 135–145)
Total Protein: 7.9 g/dL (ref 6.0–8.3)

## 2015-03-28 LAB — CBC WITH DIFFERENTIAL/PLATELET
Basophils Absolute: 0 10*3/uL (ref 0.0–0.1)
Basophils Relative: 0 % (ref 0–1)
Eosinophils Absolute: 0.2 10*3/uL (ref 0.0–0.7)
Eosinophils Relative: 2 % (ref 0–5)
HCT: 42.8 % (ref 39.0–52.0)
HEMOGLOBIN: 15.3 g/dL (ref 13.0–17.0)
LYMPHS ABS: 2.5 10*3/uL (ref 0.7–4.0)
Lymphocytes Relative: 26 % (ref 12–46)
MCH: 30.4 pg (ref 26.0–34.0)
MCHC: 35.7 g/dL (ref 30.0–36.0)
MCV: 84.9 fL (ref 78.0–100.0)
MONOS PCT: 7 % (ref 3–12)
Monocytes Absolute: 0.7 10*3/uL (ref 0.1–1.0)
NEUTROS PCT: 65 % (ref 43–77)
Neutro Abs: 6.4 10*3/uL (ref 1.7–7.7)
PLATELETS: 205 10*3/uL (ref 150–400)
RBC: 5.04 MIL/uL (ref 4.22–5.81)
RDW: 14.1 % (ref 11.5–15.5)
WBC: 9.7 10*3/uL (ref 4.0–10.5)

## 2015-03-28 LAB — CBG MONITORING, ED
Glucose-Capillary: 389 mg/dL — ABNORMAL HIGH (ref 70–99)
Glucose-Capillary: 334 mg/dL — ABNORMAL HIGH (ref 70–99)

## 2015-03-28 LAB — URINE MICROSCOPIC-ADD ON

## 2015-03-28 LAB — ETHANOL

## 2015-03-28 LAB — URINALYSIS, ROUTINE W REFLEX MICROSCOPIC
Bilirubin Urine: NEGATIVE
Glucose, UA: >1000 mg/dL — AB
Hgb urine dipstick: NEGATIVE
Ketones, ur: NEGATIVE mg/dL
LEUKOCYTES UA: NEGATIVE
NITRITE: NEGATIVE
PROTEIN: NEGATIVE mg/dL
SPECIFIC GRAVITY, URINE: 1.016 (ref 1.005–1.030)
Urobilinogen, UA: 0.2 mg/dL (ref 0.0–1.0)
pH: 5 (ref 5.0–8.0)

## 2015-03-28 LAB — AMMONIA: AMMONIA: 9 umol/L — AB (ref 11–32)

## 2015-03-28 MED ORDER — INSULIN REGULAR HUMAN 100 UNIT/ML IJ SOLN
10.0000 [IU] | Freq: Once | INTRAMUSCULAR | Status: AC
  Administered 2015-03-28: 10 [IU] via INTRAVENOUS
  Filled 2015-03-28: qty 1

## 2015-03-28 MED ORDER — SODIUM CHLORIDE 0.9 % IV BOLUS (SEPSIS)
1000.0000 mL | Freq: Once | INTRAVENOUS | Status: AC
  Administered 2015-03-28: 1000 mL via INTRAVENOUS

## 2015-03-28 NOTE — ED Notes (Signed)
Pt having slurred speech, lethargy, and dizziness since Monday.  Pt had injection to neck at that time.  No weakness unilaterally.  No expressive aphasia, some memory loss.

## 2015-03-28 NOTE — Telephone Encounter (Signed)
Pt daughter crystal is calling to know the side effects of the shots that her father had please call [phone]

## 2015-03-28 NOTE — Telephone Encounter (Signed)
After speaking with Crystal patient daughter  And going over side effects for Mondays injection nerve block this is not a side effect patient is having slurred speech x 5 days and is very weak I advised her pre Dr [name]  To take to ED today .

## 2015-03-28 NOTE — Telephone Encounter (Signed)
I left message for Crystal to call office back I am returning her call

## 2015-03-28 NOTE — ED Provider Notes (Addendum)
CSN: [id number]     Arrival date & time 03/28/15  58 History   First MD Initiated Contact with Patient 03/28/15 1710     Chief Complaint  Patient presents with  . Fatigue  . Aphasia  . Dizziness     (Consider location/radiation/quality/duration/timing/severity/associated sxs/prior Treatment) HPI Comments:  Patient presents with his family today due to 4 days of worsening fatigue , generalized weakness, sleepiness. Family states that at home he has been stumbling around as if he were drunk but he does not drink alcohol. Daughter states when she calls them and wakes him up he has slurred speech. However the slurred speech has not been persistent. Patient states he's sleeping all the time but denies unilateral weakness, swallowing difficulty, visual changes. He denies fever, cough, shortness of breath, abdominal pain, dysuria, frequency or urgency. Patient has recently run out of some of his diabetic medication and has not taken any insulin today. He denies any recent medication changes or addition of new pain medications.  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    Past Medical History  Diagnosis Date  . Diabetes mellitus type II, uncontrolled   . OSA (obstructive sleep apnea)   . Hyperlipidemia   . RBBB (right bundle branch block)   . Depression     Bipolar  . Edema   . Hypertension   . Falling episodes     these have occurred in the past and again recurring 2011  . Spondylosis     C5-6, C6-7 MRI 2010  . CAD (coronary artery disease)     A. CABG in 2000,status post cardiac cath in 2006, 2009 ....continued chest pain and SOB despite oral medication adjestments including Ranexa. B. Cath November 2009/ DES  Stent...11/26/2008 to distal  RCA leading to acute marginal.  C.  Cath 07/2012 for CP - stable anatomy, med rx.  Marland Kitchen Hx of CABG     2000,  / one median sternotomy suture broken her chest x-ray November, 2010, no clinical significance  . Palpitations     event recorder showed sinus rhythm  . Nephrolithiasis   . Restless leg   . Dizziness   . Shortness of breath     CPX April, 2011, mild functional limitation, no clear pulmonary or cardiac limitation, possible deconditioning and mild chronotropic incompetence( peak heart rate 130)  . Cerebral ischemia     MRI November, 2010, chronic microvascular ischemia  . Anemia     hemoglobin 7.4, iron deficiency, January, 2011, 2 unit transfusion, endoscopy normal, capsule endoscopy February, 2011 no small bowel abnormalities.   Most likely source gastric erosions, followed by GI  . H/O medication noncompliance     Due to loss of insurance  . Ejection fraction     EF 60%, echo, July 31, 2012  . Diabetes mellitus without complication   . Pain in limb 06/12/2009    Qualifier: Diagnosis of  By: [name]   . Low back pain 06/12/2009    Qualifier: Diagnosis of  By: [name]   . Thyroid disease   . Right knee pain 01/07/2015   Past Surgical History  Procedure Laterality Date  . Nasal septum surgery      UP3  . Coronary artery bypass graft  2000  . Left heart catheterization with coronary/graft angiogram N/A 08/01/2012    Procedure: LEFT HEART CATHETERIZATION WITH Beatrix Fetters;  Surgeon: [name], MD;  Location: [facility] CATH LAB;  Service: Cardiovascular;  Laterality: N/A;  . Left heart catheterization with coronary/graft angiogram N/A 01/03/2015    Procedure: LEFT HEART CATHETERIZATION WITH Beatrix Fetters;  Surgeon: [name], MD;  Location: [facility] CATH LAB;  Service: Cardiovascular;  Laterality: N/A;   Family History  Problem Relation Age of Onset  . Cancer Brother     pancreatic cancer  . Diabetes Brother     4   . Stroke Brother   . Diabetes Brother   . Cancer  Brother   . Diabetes Brother   . Coronary artery disease Brother   . Diabetes Brother   . Hypothyroidism Brother   . Heart attack      Nephew  . Diabetes Mother   . Heart failure Mother   . Heart failure Father   . Irregular heart beat Daughter   . Cancer Maternal Grandmother     unknown    History  Substance Use Topics  . Smoking status: Never Smoker   . Smokeless tobacco: Never Used  . Alcohol Use: No    Review of Systems  All other systems reviewed and are negative.     Allergies  Morphine and Shellfish-derived products  Home Medications   Prior to Admission medications   Medication Sig Start Date End Date Taking? Authorizing Provider  aspirin EC 325 MG EC tablet Take 1 tablet (325 mg total) by mouth daily. 01/04/15   Bonnielee Haff, MD  BAYER CONTOUR NEXT TEST test strip  01/29/15   Historical Provider, MD  clotrimazole-betamethasone (LOTRISONE) cream APPLY TO THE AFFECTED TWICE DAILY 11/23/13   Lisabeth Pick, MD  diclofenac sodium (VOLTAREN) 1 % GEL Apply 2 g topically 3 (three) times daily as needed. For pain to affected area 10/24/12   Doe-Hyun R Shawna Orleans, DO  furosemide (LASIX) 20 MG tablet Take 1 tablet (20 mg total) by mouth daily. 05/07/13   Doe-Hyun R Shawna Orleans, DO  gabapentin (NEURONTIN) 300 MG capsule Take 300 mg by mouth 3 (three) times daily.    Historical Provider, MD  HYDROcodone-acetaminophen (NORCO/VICODIN) 5-325 MG per tablet Take 2 tablets by mouth every 4 (four) hours as needed. 10/30/14   Fransico Meadow, PA-C  hyoscyamine (LEVSIN, ANASPAZ) 0.125 MG tablet Take 1 tablet (0.125 mg total) by mouth every 8 (eight) hours as needed for cramping. 03/06/13   Doe-Hyun R Shawna Orleans, DO  insulin aspart (NOVOLOG) 100 UNIT/ML injection Use 10-17 unit 3 times daily before meals 12/12/14   Philemon Kingdom, MD  Insulin Glargine (TOUJEO SOLOSTAR) 300 UNIT/ML SOPN Inject 42 Units into the skin at bedtime. 02/20/15   [name], MD  isosorbide mononitrate (IMDUR) 120 MG 24 hr tablet Take  1 tablet (120 mg total) by mouth daily. 05/08/13   Doe-Hyun R Shawna Orleans, DO  levothyroxine (SYNTHROID, LEVOTHROID) 75 MCG tablet Take 1 tablet (75 mcg total) by mouth daily. 05/07/13   Doe-Hyun R Shawna Orleans, DO  metFORMIN (GLUCOPHAGE) 1000 MG tablet Take 1 tablet (1,000 mg total) by mouth 2 (two) times daily with a meal. Please resume on 01/05/15 01/04/15   Bonnielee Haff, MD  nitroGLYCERIN (NITROSTAT) 0.4 MG SL tablet Place 1 tablet (0.4 mg total) under the tongue every 5 (five) minutes as needed (up to 3 doses). 05/08/13   Doe-Hyun R Shawna Orleans, DO  NOVOLOG FLEXPEN 100 UNIT/ML FlexPen  01/29/15   Historical Provider, MD  pantoprazole (PROTONIX) 40 MG tablet Take twice daily for 2 weeks to see if it helps the symptoms. 01/31/15   [name], MD  pramipexole (MIRAPEX) 0.25 MG tablet Take 1 tablet (0.25 mg total) by mouth at bedtime. 05/08/13   Doe-Hyun R Shawna Orleans, DO  sertraline (ZOLOFT) 100 MG tablet Take 1 tablet (100 mg total) by mouth 2 (two) times daily. 05/07/13   Doe-Hyun R Shawna Orleans, DO  simvastatin (ZOCOR) 40 MG tablet Take 1 tablet (40 mg total) by mouth every evening. 05/11/13   Doe-Hyun R Shawna Orleans, DO  tiZANidine (ZANAFLEX) 2 MG tablet Take 1 to 2 tab at bedtime. 03/12/15   Pieter Partridge, DO  topiramate (TOPAMAX) 25 MG tablet TAKE 1 TABLET BY MOUTH AT NIGHT FOR 1 WEEK THEN TAKE 2 TABLETS AT NIGHT 02/20/15   [name], MD  zolpidem (AMBIEN) 5 MG tablet Take 1 tablet (5 mg total) by mouth at bedtime as needed for sleep. 03/06/13   Doe-Hyun R Shawna Orleans, DO   There were no vitals taken for this visit. Physical Exam  Constitutional: He is oriented to person, place, and time. He appears well-developed and well-nourished. No distress.  HENT:  Head: Normocephalic and atraumatic.  Right Ear: Tympanic membrane normal.  Left Ear: Tympanic membrane normal.  Mouth/Throat: Oropharynx is clear and moist.  Eyes: Conjunctivae and EOM are normal. Pupils are equal, round, and reactive to light.  Neck: Normal range of motion. Neck supple.  Cardiovascular:  Normal rate, regular rhythm and intact distal pulses.   No murmur heard. Pulmonary/Chest: Effort normal and breath sounds normal. No respiratory distress. He has no wheezes. He has no rales.  Abdominal: Soft. He exhibits no distension. There is no tenderness. There is no rebound and no guarding.  Musculoskeletal: Normal range of motion. He exhibits no edema or tenderness.  Neurological: He is alert and oriented to person, place, and time. He has normal strength. No cranial nerve deficit or sensory deficit. He displays a negative Romberg sign. Coordination normal.  Normal heel to shin bilaterally.  Normal gait.  Speech is clear and coherent  Skin: Skin is warm and dry. No rash noted. No erythema.  Psychiatric: He has a normal mood and affect. His behavior is normal.  Nursing note and vitals reviewed.   ED Course  Procedures (including critical care time) Labs Review Labs Reviewed  COMPREHENSIVE METABOLIC PANEL - Abnormal; Notable for the following:    Sodium 131 (*)    Glucose, Bld 417 (*)    BUN 24 (*)    GFR calc non Af Amer 56 (*)    GFR calc Af Amer 65 (*)    All other components within normal limits  URINALYSIS, ROUTINE W REFLEX MICROSCOPIC - Abnormal; Notable for the following:    Glucose, UA >1000 (*)    All other components within normal limits  AMMONIA - Abnormal; Notable for the following:    Ammonia 9 (*)    All other components within normal limits  CBG MONITORING, ED - Abnormal; Notable for the following:    Glucose-Capillary 389 (*)    All other components within normal limits  CBG MONITORING, ED - Abnormal; Notable for the following:    Glucose-Capillary 334 (*)    All other components within normal limits  CBC WITH DIFFERENTIAL/PLATELET  ETHANOL  URINE MICROSCOPIC-ADD ON    Imaging Review Ct Head Wo Contrast  03/28/2015   CLINICAL DATA:  Confusion  EXAM: CT  HEAD WITHOUT CONTRAST  TECHNIQUE: Contiguous axial images were obtained from the base of the skull  through the vertex without intravenous contrast.  COMPARISON:  CT 02/25/2015  FINDINGS: Mild atrophy.  Negative for hydrocephalus.  Negative for acute infarct. Negative for hemorrhage or mass. No edema or shift of the midline structures  Calvarium intact.  IMPRESSION: Mild atrophy.  No acute intracranial abnormality.   Electronically Signed   By: Franchot Gallo [name]   On: 03/28/2015 17:51     EKG Interpretation None      MDM   Final diagnoses:  Confusion  Hyperglycemia     patient presenting with 4 days of intermittent slurred speech, drowsiness, fatigue , stumbling gait and generalized weakness. Patient states symptoms started after getting an occipital block which he was receiving for  Persistent severe headaches after a concussion. Patient denies alcohol or drug use. He denies any recent medication changes in the last 4 days that could contribute to his symptoms. Patient did run out of his Toujeo  2 days ago and is awaiting needles to come for his NovoLog. He has not had insulin today.   patient denies any urinary tract infection symptoms, no fever no vomiting no chest pain no shortness of breath no cough or URI symptoms. Patient has no evidence of slurred speech or neurologic abnormality on exam currently. He is not an alcohol user and family confirms this.  Occipital nerve block would not be causing his symptoms today.  Concerned that his hyperglycemia may be from contributing factor to his symptoms and may be worse due to receiving steroids in the block. However will do a CT of the brain to rule out stroke, EtOH, ammonia, CBC, CMP, urine for further evaluation.  6:57 PM  labs and imaging are within normal limits except for hyperglycemia. After IV fluid bolus patient's blood sugar is 336. He was given an IV dose of insulin. Speaking with the patient he has all of the prescriptions for his diabetic medication however he is unable to afford them. He has been working with [employer] to try to see  if he can get the medication cheaper. He was given insulin syringes here so he can administer his NovoLog sliding scale at home  Blanchie Dessert, MD 03/28/15 [place], MD 03/28/15 1859

## 2015-03-31 ENCOUNTER — Telehealth: Payer: Self-pay | Admitting: *Deleted

## 2015-03-31 NOTE — Telephone Encounter (Signed)
I called pt and wife, not working #.  I called and LMVM for pts daughter that noted Dr. [name] [facility]) has seen pt as well.   If appt not needed have pt call back and cancel.

## 2015-03-31 NOTE — Telephone Encounter (Signed)
Received call back and pt had injections last week and will back as needed.

## 2015-04-01 ENCOUNTER — Ambulatory Visit: Payer: Medicare PPO | Admitting: Nurse Practitioner

## 2015-04-15 ENCOUNTER — Ambulatory Visit: Payer: Medicare PPO | Admitting: Neurology

## 2015-04-25 ENCOUNTER — Ambulatory Visit (INDEPENDENT_AMBULATORY_CARE_PROVIDER_SITE_OTHER): Payer: Medicare PPO | Admitting: Neurology

## 2015-04-25 ENCOUNTER — Ambulatory Visit (INDEPENDENT_AMBULATORY_CARE_PROVIDER_SITE_OTHER): Payer: Medicare PPO | Admitting: Cardiology

## 2015-04-25 ENCOUNTER — Encounter: Payer: Self-pay | Admitting: Neurology

## 2015-04-25 ENCOUNTER — Encounter: Payer: Self-pay | Admitting: Cardiology

## 2015-04-25 VITALS — BP 122/70 | HR 76 | Ht 69.0 in | Wt 178.6 lb

## 2015-04-25 VITALS — BP 130/70 | HR 82 | Resp 16 | Ht 69.0 in | Wt 179.0 lb

## 2015-04-25 DIAGNOSIS — G44319 Acute post-traumatic headache, not intractable: Secondary | ICD-10-CM

## 2015-04-25 DIAGNOSIS — S060X0S Concussion without loss of consciousness, sequela: Secondary | ICD-10-CM

## 2015-04-25 DIAGNOSIS — I2510 Atherosclerotic heart disease of native coronary artery without angina pectoris: Secondary | ICD-10-CM

## 2015-04-25 MED ORDER — TOPIRAMATE 50 MG PO TABS: 50.0000 mg | ORAL_TABLET | Freq: Every day | ORAL | Status: DC

## 2015-04-25 NOTE — Progress Notes (Signed)
NEUROLOGY FOLLOW UP OFFICE NOTE  DEWAND VERDELL RN:[id number]  HISTORY OF PRESENT ILLNESS: [name] [name] is a 69 year old right-handed man with type 2 diabetes mellitus, OSA, hyperlipidemia, coronary artery disease, and hypertension who follows up for post-concussive migraines.  ED note and labs reviewed.  UPDATE: He is taking topiramate 50mg .  He didn't start the tizanidine.  He had a right-sided occipital nerve block.  Last month, he developed gradual fatigue, weakness and slurred speech.  He also seemed to be stumbling.  He went to the ED on 03/28/15.  Ammonia was negative.  Blood glucose was in the 400s.  Reportedly, he had run out of insulin.  He had a non-focal exam.  His symptoms were thought to be due to hyperglycemia.  Following the occipital nerve block, the headaches have improved.  They are usually mild and only occur twice a week now.  Every now and then, he may have a severe headache triggered at the back of the neck.  Overall, the tenderness in the right suboccipital region has resolved.  HISTORY: In March, he was struck several times in the face mask while playing catcher in a baseball game over the course of two games.   At least one of the times, he was hit in the left eye.  He doesn't think he lost consciousness, but it did knock him down to the ground.  The next day, he began to feel nauseous and diaphoretic.  He went home and passed out of the bed.  EMS was called and he was brought to the ED, where CT of the head was unremarkable.  CT of the cervical spine showed C6-7 degenerative disc disease but otherwise unremarkable.  He also developed a headache.  It starts in the back of his head and travels up to the forehead.  It is a "hurting" pain and his eyes hurt.  It can be anywhere from 3 to 9/10.  It lasts from 30 to 60 minutes and occurs three times a day.  It is associated with nausea, photophobia, phonophobia and some blurred vision.  It causes him to stagger.  It is not  positional.  He also notes pain in the right suboccipital region, which causes numbness and tingling traveling up the back of his head.  He notes that his neck feels stiff.  Due to persistent headache, he had another CT of the head performed on 02/26/15, which was stable.    He has worked as a Environmental education officer for 30 years and has been hit in the face mask before.  He also played football in his youth.  He has no prior history of headache.  He takes gabapentin for neuralgia in his hands.  He takes sertraline 100mg  for depression.  PAST MEDICAL HISTORY: Past Medical History  Diagnosis Date  . Diabetes mellitus type II, uncontrolled   . OSA (obstructive sleep apnea)   . Hyperlipidemia   . RBBB (right bundle branch block)   . Depression     Bipolar  . Edema   . Hypertension   . Falling episodes     these have occurred in the past and again recurring 2011  . Spondylosis     C5-6, C6-7 MRI 2010  . CAD (coronary artery disease)     A. CABG in 2000,status post cardiac cath in 2006, 2009 ....continued chest pain and SOB despite oral medication adjestments including Ranexa. B. Cath November 2009/ DES  Stent...11/26/2008 to distal  RCA leading to acute marginal.  C. Cath 07/2012 for CP - stable anatomy, med rx.  Marland Kitchen Hx of CABG     2000,  / one median sternotomy suture broken her chest x-ray November, 2010, no clinical significance  . Palpitations     event recorder showed sinus rhythm  . Nephrolithiasis   . Restless leg   . Dizziness   . Shortness of breath     CPX April, 2011, mild functional limitation, no clear pulmonary or cardiac limitation, possible deconditioning and mild chronotropic incompetence( peak heart rate 130)  . Cerebral ischemia     MRI November, 2010, chronic microvascular ischemia  . Anemia     hemoglobin 7.4, iron deficiency, January, 2011, 2 unit transfusion, endoscopy normal, capsule endoscopy February, 2011 no small bowel abnormalities.   Most likely source gastric  erosions, followed by GI  . H/O medication noncompliance     Due to loss of insurance  . Ejection fraction     EF 60%, echo, July 31, 2012  . Diabetes mellitus without complication   . Pain in limb 06/12/2009    Qualifier: Diagnosis of  By: [name]   . Low back pain 06/12/2009    Qualifier: Diagnosis of  By: [name]   . Thyroid disease   . Right knee pain 01/07/2015    MEDICATIONS: Current Outpatient Prescriptions on File Prior to Visit  Medication Sig Dispense Refill  . aspirin EC 325 MG EC tablet Take 1 tablet (325 mg total) by mouth daily. 30 tablet 2  . clotrimazole-betamethasone (LOTRISONE) cream APPLY TO THE AFFECTED TWICE DAILY 30 g 0  . diclofenac sodium (VOLTAREN) 1 % GEL Apply 2 g topically 3 (three) times daily as needed. For pain to affected area 2 Tube 3  . furosemide (LASIX) 20 MG tablet Take 1 tablet (20 mg total) by mouth daily. 90 tablet 1  . gabapentin (NEURONTIN) 300 MG capsule Take 300 mg by mouth 3 (three) times daily.    . hyoscyamine (LEVSIN, ANASPAZ) 0.125 MG tablet Take 1 tablet (0.125 mg total) by mouth every 8 (eight) hours as needed for cramping. 30 tablet 0  . insulin aspart (NOVOLOG) 100 UNIT/ML injection Use 10-17 unit 3 times daily before meals 15 mL 2  . Insulin Glargine (TOUJEO SOLOSTAR) 300 UNIT/ML SOPN Inject 42 Units into the skin at bedtime. 3 pen 2  . isosorbide mononitrate (IMDUR) 120 MG 24 hr tablet Take 1 tablet (120 mg total) by mouth daily. 90 tablet 1  . levothyroxine (SYNTHROID, LEVOTHROID) 75 MCG tablet Take 1 tablet (75 mcg total) by mouth daily. 90 tablet 1  . metFORMIN (GLUCOPHAGE) 1000 MG tablet Take 1 tablet (1,000 mg total) by mouth 2 (two) times daily with a meal. Please resume on 01/05/15 180 tablet 1  . NOVOLOG FLEXPEN 100 UNIT/ML FlexPen Sliding Scale  3  . pantoprazole (PROTONIX) 40 MG tablet Take twice daily for 2 weeks to see if it helps the symptoms. 180 tablet 1  . pramipexole (MIRAPEX) 0.25 MG tablet Take 1  tablet (0.25 mg total) by mouth at bedtime. 90 tablet 1  . sertraline (ZOLOFT) 100 MG tablet Take 1 tablet (100 mg total) by mouth 2 (two) times daily. 180 tablet 1  . simvastatin (ZOCOR) 40 MG tablet Take 1 tablet (40 mg total) by mouth every evening. 90 tablet 1  . zolpidem (AMBIEN) 5 MG tablet Take 1 tablet (5 mg total) by mouth at bedtime as needed for sleep. 30 tablet 3  .  nitroGLYCERIN (NITROSTAT) 0.4 MG SL tablet Place 1 tablet (0.4 mg total) under the tongue every 5 (five) minutes as needed (up to 3 doses). (Patient not taking: Reported on 04/25/2015) 25 tablet 4   No current facility-administered medications on file prior to visit.    ALLERGIES: Allergies  Allergen Reactions  . Morphine     REACTION: hallucinations  . Shellfish-Derived Products Itching    FAMILY HISTORY: Family History  Problem Relation Age of Onset  . Cancer Brother     pancreatic cancer  . Diabetes Brother     4   . Stroke Brother   . Diabetes Brother   . Cancer Brother   . Diabetes Brother   . Coronary artery disease Brother   . Diabetes Brother   . Hypothyroidism Brother   . Heart attack      Nephew  . Diabetes Mother   . Heart failure Mother   . Heart failure Father   . Irregular heart beat Daughter   . Cancer Maternal Grandmother     unknown     SOCIAL HISTORY: History   Social History  . Marital Status: Married    Spouse Name: N/A  . Number of Children: 4  . Years of Education: 13   Occupational History  . retired    Social History Main Topics  . Smoking status: Never Smoker   . Smokeless tobacco: Never Used  . Alcohol Use: No  . Drug Use: No  . Sexual Activity:    Partners: Female   Other Topics Concern  . Not on file   Social History Narrative   Patient is right handed.   Patient does not drink caffeine.    REVIEW OF SYSTEMS: Constitutional: No fevers, chills, or sweats, no generalized fatigue, change in appetite Eyes: No visual changes, double vision, eye  pain Ear, nose and throat: No hearing loss, ear pain, nasal congestion, sore throat Cardiovascular: No chest pain, palpitations Respiratory:  No shortness of breath at rest or with exertion, wheezes GastrointestinaI: No nausea, vomiting, diarrhea, abdominal pain, fecal incontinence Genitourinary:  No dysuria, urinary retention or frequency Musculoskeletal:  No neck pain, back pain Integumentary: No rash, pruritus, skin lesions Neurological: as above Psychiatric: No depression, insomnia, anxiety Endocrine: No palpitations, fatigue, diaphoresis, mood swings, change in appetite, change in weight, increased thirst Hematologic/Lymphatic:  No anemia, purpura, petechiae. Allergic/Immunologic: no itchy/runny eyes, nasal congestion, recent allergic reactions, rashes  PHYSICAL EXAM: Filed Vitals:   04/25/15 1502  BP: 130/70  Pulse: 82  Resp: 16   General: No acute distress Head:  Normocephalic/atraumatic Eyes:  Fundoscopic exam unremarkable without vessel changes, exudates, hemorrhages or papilledema. Neck: supple, no paraspinal tenderness, full range of motion Heart:  Regular rate and rhythm Lungs:  Clear to auscultation bilaterally Back: No paraspinal tenderness Neurological Exam: alert and oriented to person, place, and time. Attention span and concentration intact, recent and remote memory intact, fund of knowledge intact.  Speech fluent and not dysarthric, language intact.  CN II-XII intact. Fundoscopic exam unremarkable without vessel changes, exudates, hemorrhages or papilledema.  Bulk and tone normal, muscle strength 5/5 throughout.  Sensation to light touch, temperature and vibration intact.  Deep tendon reflexes 2+ throughout, toes downgoing.  Finger to nose and heel to shin testing intact.  Gait normal.  IMPRESSION: Post-concussive headaches, with cervicogenic component  PLAN: 1.  Continue topiramate 50mg  at bedtime 2.  For abortive therapy, try ibuprofen or naproxen (limited to  no more than 2 days per week) 3.  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  Follow up in 3 months.  Metta Clines, DO  CC: Penni Homans, MD

## 2015-04-25 NOTE — Progress Notes (Signed)
Cardiology Office Note   Date:  04/25/2015   ID:  [name] [name], DOB [birth year], MRN [id number]  PCP:  Penni Homans, MD  Cardiologist:  Dola Argyle, MD   Chief Complaint  Patient presents with  . Appointment    Follow-up CAD      History of Present Illness: [name] [name] is a 69 y.o. male who presents to follow up coronary artery disease. Despite problems in the past, he is stable. He's had multiple concussions over time related to playing softball. He had headaches had an injection to the back of his head by neurology. Later he had some confusion and was assessed fully in the emergency room. Today he's fine. He is not having any significant chest pain.    Past Medical History  Diagnosis Date  . Diabetes mellitus type II, uncontrolled   . OSA (obstructive sleep apnea)   . Hyperlipidemia   . RBBB (right bundle branch block)   . Depression     Bipolar  . Edema   . Hypertension   . Falling episodes     these have occurred in the past and again recurring 2011  . Spondylosis     C5-6, C6-7 MRI 2010  . CAD (coronary artery disease)     A. CABG in 2000,status post cardiac cath in 2006, 2009 ....continued chest pain and SOB despite oral medication adjestments including Ranexa. B. Cath November 2009/ DES  Stent...11/26/2008 to distal  RCA leading to acute marginal.  C. Cath 07/2012 for CP - stable anatomy, med rx.  Marland Kitchen Hx of CABG     2000,  / one median sternotomy suture broken her chest x-ray November, 2010, no clinical significance  . Palpitations     event recorder showed sinus rhythm  . Nephrolithiasis   . Restless leg   . Dizziness   . Shortness of breath     CPX April, 2011, mild functional limitation, no clear pulmonary or cardiac limitation, possible deconditioning and mild chronotropic incompetence( peak heart rate 130)  . Cerebral ischemia     MRI November, 2010, chronic microvascular ischemia  . Anemia     hemoglobin 7.4, iron deficiency, January, 2011, 2  unit transfusion, endoscopy normal, capsule endoscopy February, 2011 no small bowel abnormalities.   Most likely source gastric erosions, followed by GI  . H/O medication noncompliance     Due to loss of insurance  . Ejection fraction     EF 60%, echo, July 31, 2012  . Diabetes mellitus without complication   . Pain in limb 06/12/2009    Qualifier: Diagnosis of  By: [name]   . Low back pain 06/12/2009    Qualifier: Diagnosis of  By: [name]   . Thyroid disease   . Right knee pain 01/07/2015    Past Surgical History  Procedure Laterality Date  . Nasal septum surgery      UP3  . Coronary artery bypass graft      2000  . Left heart catheterization with coronary/graft angiogram N/A 08/01/2012    Procedure: LEFT HEART CATHETERIZATION WITH Beatrix Fetters;  Surgeon: [name], MD;  Location: [facility] CATH LAB;  Service: Cardiovascular;  Laterality: N/A;  . Left heart catheterization with coronary/graft angiogram N/A 01/03/2015    Procedure: LEFT HEART CATHETERIZATION WITH Beatrix Fetters;  Surgeon: [name], MD;  Location: [facility] CATH LAB;  Service: Cardiovascular;  Laterality: N/A;    Patient Active Problem List   Diagnosis  Date Noted  . CAD (coronary artery disease)     Priority: High  . Anemia     Priority: High  . Edema     Priority: High  . Falling episodes     Priority: High  . Hx of CABG     Priority: High  . Palpitations     Priority: High  . Shortness of breath     Priority: High  . DIZZINESS 01/17/2009    Priority: High  . Concussion without loss of consciousness 02/18/2015  . Post-traumatic headache 02/18/2015  . Right knee pain 01/07/2015  . Abdominal pain, unspecified site 03/06/2013  . Right shoulder pain 10/24/2012  . Eczema 10/24/2012  . Bilateral groin pain 09/21/2012  . Ejection fraction   . Diabetes mellitus type II, uncontrolled 08/31/2011  . OSA (obstructive sleep apnea)   . Hyperlipidemia   . Hypertension     . Insomnia 04/26/2011  . FATIGUE 12/10/2010  . Hypothyroidism 06/26/2010  . RESTLESS LEG SYNDROME 09/19/2009  . BENIGN POSITIONAL VERTIGO 09/19/2009  . DEPRESSIVE DISORDER NOT ELSEWHERE CLASSIFIED 07/09/2009  . RBBB 07/09/2009  . Low back pain 06/12/2009  . SCIATICA 01/17/2009  . GERD 01/09/2009  . TUBULOVILLOUS ADENOMA, COLON, HX OF 01/08/2009  . NEPHROLITHIASIS, HX OF 12/20/2007      Current Outpatient Prescriptions  Medication Sig Dispense Refill  . aspirin EC 325 MG EC tablet Take 1 tablet (325 mg total) by mouth daily. 30 tablet 2  . BAYER CONTOUR NEXT TEST test strip   9  . clotrimazole-betamethasone (LOTRISONE) cream APPLY TO THE AFFECTED TWICE DAILY 30 g 0  . diclofenac sodium (VOLTAREN) 1 % GEL Apply 2 g topically 3 (three) times daily as needed. For pain to affected area 2 Tube 3  . furosemide (LASIX) 20 MG tablet Take 1 tablet (20 mg total) by mouth daily. 90 tablet 1  . gabapentin (NEURONTIN) 300 MG capsule Take 300 mg by mouth 3 (three) times daily.    . hyoscyamine (LEVSIN, ANASPAZ) 0.125 MG tablet Take 1 tablet (0.125 mg total) by mouth every 8 (eight) hours as needed for cramping. 30 tablet 0  . insulin aspart (NOVOLOG) 100 UNIT/ML injection Use 10-17 unit 3 times daily before meals 15 mL 2  . Insulin Glargine (TOUJEO SOLOSTAR) 300 UNIT/ML SOPN Inject 42 Units into the skin at bedtime. 3 pen 2  . isosorbide mononitrate (IMDUR) 120 MG 24 hr tablet Take 1 tablet (120 mg total) by mouth daily. 90 tablet 1  . levothyroxine (SYNTHROID, LEVOTHROID) 75 MCG tablet Take 1 tablet (75 mcg total) by mouth daily. 90 tablet 1  . metFORMIN (GLUCOPHAGE) 1000 MG tablet Take 1 tablet (1,000 mg total) by mouth 2 (two) times daily with a meal. Please resume on 01/05/15 180 tablet 1  . nitroGLYCERIN (NITROSTAT) 0.4 MG SL tablet Place 1 tablet (0.4 mg total) under the tongue every 5 (five) minutes as needed (up to 3 doses). 25 tablet 4  . NOVOLOG FLEXPEN 100 UNIT/ML FlexPen   3  .  pantoprazole (PROTONIX) 40 MG tablet Take twice daily for 2 weeks to see if it helps the symptoms. 180 tablet 1  . pramipexole (MIRAPEX) 0.25 MG tablet Take 1 tablet (0.25 mg total) by mouth at bedtime. 90 tablet 1  . sertraline (ZOLOFT) 100 MG tablet Take 1 tablet (100 mg total) by mouth 2 (two) times daily. 180 tablet 1  . simvastatin (ZOCOR) 40 MG tablet Take 1 tablet (40 mg total) by mouth every evening. [place]  tablet 1  . tiZANidine (ZANAFLEX) 2 MG tablet Take 1 to 2 tab at bedtime. 60 tablet 0  . topiramate (TOPAMAX) 25 MG tablet TAKE 1 TABLET BY MOUTH AT NIGHT FOR 1 WEEK THEN TAKE 2 TABLETS AT NIGHT 158 tablet 1  . zolpidem (AMBIEN) 5 MG tablet Take 1 tablet (5 mg total) by mouth at bedtime as needed for sleep. 30 tablet 3   No current facility-administered medications for this visit.    Allergies:   Morphine and Shellfish-derived products    Social History:  The patient  reports that he has never smoked. He has never used smokeless tobacco. He reports that he does not drink alcohol or use illicit drugs.   Family History:  The patient's family history includes Cancer in his brother, brother, and maternal grandmother; Coronary artery disease in his brother; Diabetes in his brother, brother, brother, brother, and mother; Heart attack in an other family member; Heart failure in his father and mother; Hypothyroidism in his brother; Irregular heart beat in his daughter; Stroke in his brother.    ROS:  Please see the history of present illness.     Patient denies fever, chills, headache, sweats, rash, change in vision, change in hearing, chest pain, cough, nausea or vomiting, urinary symptoms. All other systems are reviewed and are negative.   PHYSICAL EXAM: VS:  BP 122/70 mmHg  Pulse 76  Ht 5\' 9"  (1.753 m)  Wt 178 lb 9.6 oz (81.012 kg)  BMI 26.36 kg/m2 , Patient is oriented to person time and place. Affect is normal. Head is atraumatic. Sclera and conjunctiva are normal. There is no  jugulovenous distention. Lungs are clear. Respiratory effort is nonlabored. Cardiac exam reveals an S1 and S2. The abdomen is soft. There is no peripheral edema. There are no musculoskeletal deformities. There are no skin rashes.   EKG:   EKG is not done today.  Recent Labs: 11/22/2014: TSH 1.464 01/02/2015: B Natriuretic Peptide 15.2 03/28/2015: ALT 19; BUN 24*; Creatinine 1.29; Hemoglobin 15.3; Platelets 205; Potassium 4.4; Sodium 131*    Lipid Panel    Component Value Date/Time   CHOL 173 01/03/2015 0040   TRIG 198* 01/03/2015 0040   HDL 31* 01/03/2015 0040   CHOLHDL 5.6 01/03/2015 0040   VLDL 40 01/03/2015 0040   LDLCALC 102* 01/03/2015 0040   LDLDIRECT 46.5 09/21/2012 1658      Wt Readings from Last 3 Encounters:  04/25/15 178 lb 9.6 oz (81.012 kg)  03/12/15 185 lb 14.4 oz (84.324 kg)  02/25/15 184 lb (83.462 kg)      Current medicines are reviewed  The patient understands his medications.     ASSESSMENT AND PLAN:

## 2015-04-25 NOTE — Patient Instructions (Signed)
**Note De-Identified  Obfuscation** Medication Instructions:  Same  Labwork: None  Testing/Procedures: None  Follow-Up: Your physician wants you to follow-up in: 6 months. You will receive a reminder letter in the mail two months in advance. If you don't receive a letter, please call our office to schedule the follow-up appointment.

## 2015-04-25 NOTE — Assessment & Plan Note (Signed)
Patient is post CABG and other procedures over time. His last catheterization was January, 2016. There was 50-70% LAD. The stent to the right coronary was patent. LIMA was patent to the LAD. Vein graft to the OM, diagonal, and PDA were patent. EF was 60% with normal wall motion. There were no culprit lesions at that time. He is not having any symptoms at this time. No further workup.

## 2015-04-25 NOTE — Patient Instructions (Signed)
1.  Continue topiramate 50mg  at bedtime 2.  If you get a headache, try taking ibuprofen or naproxen.  Limit use to no more than 2 days per week. 3.  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   Follow up in 3 months.

## 2015-04-25 NOTE — Assessment & Plan Note (Signed)
The concussions have been related to being hit when he is playing softball or reffing softball. He stable today.

## 2015-04-30 ENCOUNTER — Encounter: Payer: Self-pay | Admitting: *Deleted

## 2015-04-30 ENCOUNTER — Telehealth: Payer: Self-pay | Admitting: *Deleted

## 2015-04-30 NOTE — Telephone Encounter (Signed)
Pre-Visit Call completed with patient and chart updated.   Pre-Visit Info documented in Specialty Comments under [employer].    

## 2015-05-01 ENCOUNTER — Ambulatory Visit (INDEPENDENT_AMBULATORY_CARE_PROVIDER_SITE_OTHER): Payer: Medicare PPO | Admitting: Family Medicine

## 2015-05-01 ENCOUNTER — Encounter: Payer: Self-pay | Admitting: Family Medicine

## 2015-05-01 VITALS — BP 122/78 | HR 64 | Temp 97.6°F | Ht 69.0 in | Wt 174.4 lb

## 2015-05-01 DIAGNOSIS — E785 Hyperlipidemia, unspecified: Secondary | ICD-10-CM

## 2015-05-01 DIAGNOSIS — E039 Hypothyroidism, unspecified: Secondary | ICD-10-CM

## 2015-05-01 DIAGNOSIS — R002 Palpitations: Secondary | ICD-10-CM

## 2015-05-01 DIAGNOSIS — I10 Essential (primary) hypertension: Secondary | ICD-10-CM | POA: Diagnosis not present

## 2015-05-01 DIAGNOSIS — L578 Other skin changes due to chronic exposure to nonionizing radiation: Secondary | ICD-10-CM | POA: Diagnosis not present

## 2015-05-01 DIAGNOSIS — Z0000 Encounter for general adult medical examination without abnormal findings: Secondary | ICD-10-CM

## 2015-05-01 DIAGNOSIS — E119 Type 2 diabetes mellitus without complications: Secondary | ICD-10-CM

## 2015-05-01 DIAGNOSIS — K219 Gastro-esophageal reflux disease without esophagitis: Secondary | ICD-10-CM

## 2015-05-01 DIAGNOSIS — Z7189 Other specified counseling: Secondary | ICD-10-CM

## 2015-05-01 LAB — CBC
HCT: 46.8 % (ref 39.0–52.0)
HEMOGLOBIN: 15.8 g/dL (ref 13.0–17.0)
MCHC: 33.7 g/dL (ref 30.0–36.0)
MCV: 89 fl (ref 78.0–100.0)
Platelets: 212 10*3/uL (ref 150.0–400.0)
RBC: 5.25 Mil/uL (ref 4.22–5.81)
RDW: 13.7 % (ref 11.5–15.5)
WBC: 7.5 10*3/uL (ref 4.0–10.5)

## 2015-05-01 LAB — LIPID PANEL
CHOL/HDL RATIO: 5
Cholesterol: 162 mg/dL (ref 0–200)
HDL: 33.9 mg/dL — ABNORMAL LOW (ref >39.00)
LDL Cholesterol: 107 mg/dL — ABNORMAL HIGH (ref 0–99)
NONHDL: 128.1
TRIGLYCERIDES: 107 mg/dL (ref 0.0–149.0)
VLDL: 21.4 mg/dL (ref 0.0–40.0)

## 2015-05-01 LAB — COMPREHENSIVE METABOLIC PANEL
ALT: 18 U/L (ref 0–53)
AST: 21 U/L (ref 0–37)
Albumin: 4.7 g/dL (ref 3.5–5.2)
Alkaline Phosphatase: 93 U/L (ref 39–117)
BUN: 10 mg/dL (ref 6–23)
CO2: 29 mEq/L (ref 19–32)
Calcium: 9.8 mg/dL (ref 8.4–10.5)
Chloride: 101 mEq/L (ref 96–112)
Creatinine, Ser: 0.95 mg/dL (ref 0.40–1.50)
GFR: 84.14 mL/min (ref >60.00)
Glucose, Bld: 178 mg/dL — ABNORMAL HIGH (ref 70–99)
Potassium: 4.3 mEq/L (ref 3.5–5.1)
SODIUM: 136 meq/L (ref 135–145)
Total Bilirubin: 0.6 mg/dL (ref 0.2–1.2)
Total Protein: 7.7 g/dL (ref 6.0–8.3)

## 2015-05-01 LAB — TSH: TSH: 2.84 u[IU]/mL (ref 0.35–4.50)

## 2015-05-01 LAB — HEMOGLOBIN A1C: Hgb A1c MFr Bld: 9.3 % — ABNORMAL HIGH (ref 4.6–6.5)

## 2015-05-01 MED ORDER — LEVOTHYROXINE SODIUM 75 MCG PO TABS: 75.0000 ug | ORAL_TABLET | Freq: Every day | ORAL | Status: DC

## 2015-05-01 MED ORDER — METFORMIN HCL 1000 MG PO TABS: 1000.0000 mg | ORAL_TABLET | Freq: Two times a day (BID) | ORAL | Status: DC

## 2015-05-01 MED ORDER — SIMVASTATIN 40 MG PO TABS: 40.0000 mg | ORAL_TABLET | Freq: Every evening | ORAL | Status: DC

## 2015-05-01 MED ORDER — FUROSEMIDE 20 MG PO TABS: 20.0000 mg | ORAL_TABLET | Freq: Every day | ORAL | Status: DC

## 2015-05-01 MED ORDER — DICLOFENAC SODIUM 1 % TD GEL: 2.0000 g | Freq: Three times a day (TID) | TRANSDERMAL | Status: DC | PRN

## 2015-05-01 MED ORDER — CLOTRIMAZOLE-BETAMETHASONE 1-0.05 % EX CREA: TOPICAL_CREAM | CUTANEOUS | Status: DC

## 2015-05-01 NOTE — Assessment & Plan Note (Signed)
Avoid offending foods, start probiotics. Do not eat large meals in late evening and consider raising head of bed.  

## 2015-05-01 NOTE — Assessment & Plan Note (Addendum)
  Given and reviewed copy of ACP documents from [employer] and encouraged to complete and return

## 2015-05-01 NOTE — Assessment & Plan Note (Signed)
Tolerating statin, encouraged heart healthy diet, avoid trans fats, minimize simple carbs and saturated fats. Increase exercise as tolerated 

## 2015-05-01 NOTE — Assessment & Plan Note (Signed)
No c/o

## 2015-05-01 NOTE — Patient Instructions (Addendum)
Release of records for Colonoscopy results in [facility] for Adults A healthy lifestyle and preventive care can promote health and wellness. Preventive health guidelines for men include the following key practices:  A routine yearly physical is a good way to check with your health care provider about your health and preventative screening. It is a chance to share any concerns and updates on your health and to receive a thorough exam.  Visit your dentist for a routine exam and preventative care every 6 months. Brush your teeth twice a day and floss once a day. Good oral hygiene prevents tooth decay and gum disease.  The frequency of eye exams is based on your age, health, family medical history, use of contact lenses, and other factors. Follow your health care provider's recommendations for frequency of eye exams.  Eat a healthy diet. Foods such as vegetables, fruits, whole grains, low-fat dairy products, and lean protein foods contain the nutrients you need without too many calories. Decrease your intake of foods high in solid fats, added sugars, and salt. Eat the right amount of calories for you.Get information about a proper diet from your health care provider, if necessary.  Regular physical exercise is one of the most important things you can do for your health. Most adults should get at least 150 minutes of moderate-intensity exercise (any activity that increases your heart rate and causes you to sweat) each week. In addition, most adults need muscle-strengthening exercises on 2 or more days a week.  Maintain a healthy weight. The body mass index (BMI) is a screening tool to identify possible weight problems. It provides an estimate of body fat based on height and weight. Your health care provider can find your BMI and can help you achieve or maintain a healthy weight.For adults 20 years and older:  A BMI below 18.5 is considered underweight.  A BMI of 18.5 to 24.9 is  normal.  A BMI of 25 to 29.9 is considered overweight.  A BMI of 30 and above is considered obese.  Maintain normal blood lipids and cholesterol levels by exercising and minimizing your intake of saturated fat. Eat a balanced diet with plenty of fruit and vegetables. Blood tests for lipids and cholesterol should begin at age 77 and be repeated every 5 years. If your lipid or cholesterol levels are high, you are over 50, or you are at high risk for heart disease, you may need your cholesterol levels checked more frequently.Ongoing high lipid and cholesterol levels should be treated with medicines if diet and exercise are not working.  If you smoke, find out from your health care provider how to quit. If you do not use tobacco, do not start.  Lung cancer screening is recommended for adults aged 52-80 years who are at high risk for developing lung cancer because of a history of smoking. A yearly low-dose CT scan of the lungs is recommended for people who have at least a 30-pack-year history of smoking and are a current smoker or have quit within the past 15 years. A pack year of smoking is smoking an average of 1 pack of cigarettes a day for 1 year (for example: 1 pack a day for 30 years or 2 packs a day for 15 years). Yearly screening should continue until the smoker has stopped smoking for at least 15 years. Yearly screening should be stopped for people who develop a health problem that would prevent them from having lung cancer treatment.  If you choose to drink alcohol, do not have more than 2 drinks per day. One drink is considered to be 12 ounces (355 mL) of beer, 5 ounces (148 mL) of wine, or 1.5 ounces (44 mL) of liquor.  Avoid use of street drugs. Do not share needles with anyone. Ask for help if you need support or instructions about stopping the use of drugs.  High blood pressure causes heart disease and increases the risk of stroke. Your blood pressure should be checked at least every 1-2  years. Ongoing high blood pressure should be treated with medicines, if weight loss and exercise are not effective.  If you are 20-35 years old, ask your health care provider if you should take aspirin to prevent heart disease.  Diabetes screening involves taking a blood sample to check your fasting blood sugar level. This should be done once every 3 years, after age 78, if you are within normal weight and without risk factors for diabetes. Testing should be considered at a younger age or be carried out more frequently if you are overweight and have at least 1 risk factor for diabetes.  Colorectal cancer can be detected and often prevented. Most routine colorectal cancer screening begins at the age of 4 and continues through age 60. However, your health care provider may recommend screening at an earlier age if you have risk factors for colon cancer. On a yearly basis, your health care provider may provide home test kits to check for hidden blood in the stool. Use of a small camera at the end of a tube to directly examine the colon (sigmoidoscopy or colonoscopy) can detect the earliest forms of colorectal cancer. Talk to your health care provider about this at age 36, when routine screening begins. Direct exam of the colon should be repeated every 5-10 years through age 55, unless early forms of precancerous polyps or small growths are found.  People who are at an increased risk for hepatitis B should be screened for this virus. You are considered at high risk for hepatitis B if:  You were born in a country where hepatitis B occurs often. Talk with your health care provider about which countries are considered high risk.  Your parents were born in a high-risk country and you have not received a shot to protect against hepatitis B (hepatitis B vaccine).  You have HIV or AIDS.  You use needles to inject street drugs.  You live with, or have sex with, someone who has hepatitis B.  You are a man who  has sex with other men (MSM).  You get hemodialysis treatment.  You take certain medicines for conditions such as cancer, organ transplantation, and autoimmune conditions.  Hepatitis C blood testing is recommended for all people born from [birth year] through 1965 and any individual with known risks for hepatitis C.  Practice safe sex. Use condoms and avoid high-risk sexual practices to reduce the spread of sexually transmitted infections (STIs). STIs include gonorrhea, chlamydia, syphilis, trichomonas, herpes, HPV, and human immunodeficiency virus (HIV). Herpes, HIV, and HPV are viral illnesses that have no cure. They can result in disability, cancer, and death.  If you are at risk of being infected with HIV, it is recommended that you take a prescription medicine daily to prevent HIV infection. This is called preexposure prophylaxis (PrEP). You are considered at risk if:  You are a man who has sex with other men (MSM) and have other risk factors.  You are a heterosexual man,  are sexually active, and are at increased risk for HIV infection.  You take drugs by injection.  You are sexually active with a partner who has HIV.  Talk with your health care provider about whether you are at high risk of being infected with HIV. If you choose to begin PrEP, you should first be tested for HIV. You should then be tested every 3 months for as long as you are taking PrEP.  A one-time screening for abdominal aortic aneurysm (AAA) and surgical repair of large AAAs by ultrasound are recommended for men ages 85 to 72 years who are current or former smokers.  Healthy men should no longer receive prostate-specific antigen (PSA) blood tests as part of routine cancer screening. Talk with your health care provider about prostate cancer screening.  Testicular cancer screening is not recommended for adult males who have no symptoms. Screening includes self-exam, a health care provider exam, and other screening tests.  Consult with your health care provider about any symptoms you have or any concerns you have about testicular cancer.  Use sunscreen. Apply sunscreen liberally and repeatedly throughout the day. You should seek shade when your shadow is shorter than you. Protect yourself by wearing long sleeves, pants, a wide-brimmed hat, and sunglasses year round, whenever you are outdoors.  Once a month, do a whole-body skin exam, using a mirror to look at the skin on your back. Tell your health care provider about new moles, moles that have irregular borders, moles that are larger than a pencil eraser, or moles that have changed in shape or color.  Stay current with required vaccines (immunizations).  Influenza vaccine. All adults should be immunized every year.  Tetanus, diphtheria, and acellular pertussis (Td, Tdap) vaccine. An adult who has not previously received Tdap or who does not know his vaccine status should receive 1 dose of Tdap. This initial dose should be followed by tetanus and diphtheria toxoids (Td) booster doses every 10 years. Adults with an unknown or incomplete history of completing a 3-dose immunization series with Td-containing vaccines should begin or complete a primary immunization series including a Tdap dose. Adults should receive a Td booster every 10 years.  Varicella vaccine. An adult without evidence of immunity to varicella should receive 2 doses or a second dose if he has previously received 1 dose.  Human papillomavirus (HPV) vaccine. Males aged 76-21 years who have not received the vaccine previously should receive the 3-dose series. Males aged 22-26 years may be immunized. Immunization is recommended through the age of 87 years for any male who has sex with males and did not get any or all doses earlier. Immunization is recommended for any person with an immunocompromised condition through the age of 21 years if he did not get any or all doses earlier. During the 3-dose series, the  second dose should be obtained 4-8 weeks after the first dose. The third dose should be obtained 24 weeks after the first dose and 16 weeks after the second dose.  Zoster vaccine. One dose is recommended for adults aged 36 years or older unless certain conditions are present.  Measles, mumps, and rubella (MMR) vaccine. Adults born before [birth year] generally are considered immune to measles and mumps. Adults born in [birth year] or later should have 1 or more doses of MMR vaccine unless there is a contraindication to the vaccine or there is laboratory evidence of immunity to each of the three diseases. A routine second dose of MMR vaccine should be obtained  at least 28 days after the first dose for students attending postsecondary schools, health care workers, or international travelers. People who received inactivated measles vaccine or an unknown type of measles vaccine during 1963-1967 should receive 2 doses of MMR vaccine. People who received inactivated mumps vaccine or an unknown type of mumps vaccine before 1979 and are at high risk for mumps infection should consider immunization with 2 doses of MMR vaccine. Unvaccinated health care workers born before [birth year] who lack laboratory evidence of measles, mumps, or rubella immunity or laboratory confirmation of disease should consider measles and mumps immunization with 2 doses of MMR vaccine or rubella immunization with 1 dose of MMR vaccine.  Pneumococcal 13-valent conjugate (PCV13) vaccine. When indicated, a person who is uncertain of his immunization history and has no record of immunization should receive the PCV13 vaccine. An adult aged 62 years or older who has certain medical conditions and has not been previously immunized should receive 1 dose of PCV13 vaccine. This PCV13 should be followed with a dose of pneumococcal polysaccharide (PPSV23) vaccine. The PPSV23 vaccine dose should be obtained at least 8 weeks after the dose of PCV13 vaccine. An adult aged 61 years  or older who has certain medical conditions and previously received 1 or more doses of PPSV23 vaccine should receive 1 dose of PCV13. The PCV13 vaccine dose should be obtained 1 or more years after the last PPSV23 vaccine dose.  Pneumococcal polysaccharide (PPSV23) vaccine. When PCV13 is also indicated, PCV13 should be obtained first. All adults aged 9 years and older should be immunized. An adult younger than age 49 years who has certain medical conditions should be immunized. Any person who resides in a nursing home or long-term care facility should be immunized. An adult smoker should be immunized. People with an immunocompromised condition and certain other conditions should receive both PCV13 and PPSV23 vaccines. People with human immunodeficiency virus (HIV) infection should be immunized as soon as possible after diagnosis. Immunization during chemotherapy or radiation therapy should be avoided. Routine use of PPSV23 vaccine is not recommended for American Indians, [place] Natives, or people younger than 65 years unless there are medical conditions that require PPSV23 vaccine. When indicated, people who have unknown immunization and have no record of immunization should receive PPSV23 vaccine. One-time revaccination 5 years after the first dose of PPSV23 is recommended for people aged 19-64 years who have chronic kidney failure, nephrotic syndrome, asplenia, or immunocompromised conditions. People who received 1-2 doses of PPSV23 before age 26 years should receive another dose of PPSV23 vaccine at age 39 years or later if at least 5 years have passed since the previous dose. Doses of PPSV23 are not needed for people immunized with PPSV23 at or after age 36 years.  Meningococcal vaccine. Adults with asplenia or persistent complement component deficiencies should receive 2 doses of quadrivalent meningococcal conjugate (MenACWY-D) vaccine. The doses should be obtained at least 2 months apart. Microbiologists  working with certain meningococcal bacteria, [place] recruits, people at risk during an outbreak, and people who travel to or live in countries with a high rate of meningitis should be immunized. A first-year college student up through age 70 years who is living in a residence hall should receive a dose if he did not receive a dose on or after his 16th birthday. Adults who have certain high-risk conditions should receive one or more doses of vaccine.  Hepatitis A vaccine. Adults who wish to be protected from this disease, have certain high-risk conditions,  work with hepatitis A-infected animals, work in hepatitis A research labs, or travel to or work in countries with a high rate of hepatitis A should be immunized. Adults who were previously unvaccinated and who anticipate close contact with an international adoptee during the first 60 days after arrival in the [place] from a country with a high rate of hepatitis A should be immunized.  Hepatitis B vaccine. Adults should be immunized if they wish to be protected from this disease, have certain high-risk conditions, may be exposed to blood or other infectious body fluids, are household contacts or sex partners of hepatitis B positive people, are clients or workers in certain care facilities, or travel to or work in countries with a high rate of hepatitis B.  Haemophilus influenzae type b (Hib) vaccine. A previously unvaccinated person with asplenia or sickle cell disease or having a scheduled splenectomy should receive 1 dose of Hib vaccine. Regardless of previous immunization, a recipient of a hematopoietic stem cell transplant should receive a 3-dose series 6-12 months after his successful transplant. Hib vaccine is not recommended for adults with HIV infection. Preventive Service / Frequency Ages 25 to 65  Blood pressure check.** / Every 1 to 2 years.  Lipid and cholesterol check.** / Every 5 years beginning at age 84.  Hepatitis C blood  test.** / For any individual with known risks for hepatitis C.  Skin self-exam. / Monthly.  Influenza vaccine. / Every year.  Tetanus, diphtheria, and acellular pertussis (Tdap, Td) vaccine.** / Consult your health care provider. 1 dose of Td every 10 years.  Varicella vaccine.** / Consult your health care provider.  HPV vaccine. / 3 doses over 6 months, if 48 or younger.  Measles, mumps, rubella (MMR) vaccine.** / You need at least 1 dose of MMR if you were born in 1957 or later. You may also need a second dose.  Pneumococcal 13-valent conjugate (PCV13) vaccine.** / Consult your health care provider.  Pneumococcal polysaccharide (PPSV23) vaccine.** / 1 to 2 doses if you smoke cigarettes or if you have certain conditions.  Meningococcal vaccine.** / 1 dose if you are age 77 to 71 years and a Market researcher living in a residence hall, or have one of several medical conditions. You may also need additional booster doses.  Hepatitis A vaccine.** / Consult your health care provider.  Hepatitis B vaccine.** / Consult your health care provider.  Haemophilus influenzae type b (Hib) vaccine.** / Consult your health care provider. Ages 46 to 75  Blood pressure check.** / Every 1 to 2 years.  Lipid and cholesterol check.** / Every 5 years beginning at age 80.  Lung cancer screening. / Every year if you are aged 48-80 years and have a 30-pack-year history of smoking and currently smoke or have quit within the past 15 years. Yearly screening is stopped once you have quit smoking for at least 15 years or develop a health problem that would prevent you from having lung cancer treatment.  Fecal occult blood test (FOBT) of stool. / Every year beginning at age 77 and continuing until age 18. You may not have to do this test if you get a colonoscopy every 10 years.  Flexible sigmoidoscopy** or colonoscopy.** / Every 5 years for a flexible sigmoidoscopy or every 10 years for a colonoscopy  beginning at age 87 and continuing until age 82.  Hepatitis C blood test.** / For all people born from [birth year] through 1963 and any individual with known risks for  hepatitis C.  Skin self-exam. / Monthly.  Influenza vaccine. / Every year.  Tetanus, diphtheria, and acellular pertussis (Tdap/Td) vaccine.** / Consult your health care provider. 1 dose of Td every 10 years.  Varicella vaccine.** / Consult your health care provider.  Zoster vaccine.** / 1 dose for adults aged 42 years or older.  Measles, mumps, rubella (MMR) vaccine.** / You need at least 1 dose of MMR if you were born in 1955 or later. You may also need a second dose.  Pneumococcal 13-valent conjugate (PCV13) vaccine.** / Consult your health care provider.  Pneumococcal polysaccharide (PPSV23) vaccine.** / 1 to 2 doses if you smoke cigarettes or if you have certain conditions.  Meningococcal vaccine.** / Consult your health care provider.  Hepatitis A vaccine.** / Consult your health care provider.  Hepatitis B vaccine.** / Consult your health care provider.  Haemophilus influenzae type b (Hib) vaccine.** / Consult your health care provider. Ages 71 and over  Blood pressure check.** / Every 1 to 2 years.  Lipid and cholesterol check.**/ Every 5 years beginning at age 56.  Lung cancer screening. / Every year if you are aged 63-80 years and have a 30-pack-year history of smoking and currently smoke or have quit within the past 15 years. Yearly screening is stopped once you have quit smoking for at least 15 years or develop a health problem that would prevent you from having lung cancer treatment.  Fecal occult blood test (FOBT) of stool. / Every year beginning at age 59 and continuing until age 49. You may not have to do this test if you get a colonoscopy every 10 years.  Flexible sigmoidoscopy** or colonoscopy.** / Every 5 years for a flexible sigmoidoscopy or every 10 years for a colonoscopy beginning at age 85 and  continuing until age 25.  Hepatitis C blood test.** / For all people born from [birth year] through 1963 and any individual with known risks for hepatitis C.  Abdominal aortic aneurysm (AAA) screening.** / A one-time screening for ages 76 to 30 years who are current or former smokers.  Skin self-exam. / Monthly.  Influenza vaccine. / Every year.  Tetanus, diphtheria, and acellular pertussis (Tdap/Td) vaccine.** / 1 dose of Td every 10 years.  Varicella vaccine.** / Consult your health care provider.  Zoster vaccine.** / 1 dose for adults aged 43 years or older.  Pneumococcal 13-valent conjugate (PCV13) vaccine.** / Consult your health care provider.  Pneumococcal polysaccharide (PPSV23) vaccine.** / 1 dose for all adults aged 42 years and older.  Meningococcal vaccine.** / Consult your health care provider.  Hepatitis A vaccine.** / Consult your health care provider.  Hepatitis B vaccine.** / Consult your health care provider.  Haemophilus influenzae type b (Hib) vaccine.** / Consult your health care provider. **Family history and personal history of risk and conditions may change your health care provider's recommendations. Document Released: 01/18/2002 Document Revised: 11/27/2013 Document Reviewed: 04/19/2011 [facility] Patient Information 2015 [place], [place]. This information is not intended to replace advice given to you by your health care provider. Make sure you discuss any questions you have with your health care provider.

## 2015-05-01 NOTE — Assessment & Plan Note (Signed)
Well controlled, no changes to meds. Encouraged heart healthy diet such as the DASH diet and exercise as tolerated.  °

## 2015-05-01 NOTE — Progress Notes (Signed)
Pre visit review using our clinic review tool, if applicable. No additional management support is needed unless otherwise documented below in the visit note. 

## 2015-05-01 NOTE — Assessment & Plan Note (Signed)
hgba1c unacceptable, minimize simple carbs. Increase exercise as tolerated. Continue current meds for now and recheck hgba1c today

## 2015-05-02 LAB — MICROALBUMIN / CREATININE URINE RATIO
CREATININE, U: 85 mg/dL
MICROALB UR: 1.1 mg/dL (ref 0.0–1.9)
Microalb Creat Ratio: 1.3 mg/g (ref 0.0–30.0)

## 2015-05-06 ENCOUNTER — Encounter: Payer: Self-pay | Admitting: Family Medicine

## 2015-05-11 ENCOUNTER — Encounter: Payer: Self-pay | Admitting: Family Medicine

## 2015-05-11 DIAGNOSIS — Z7189 Other specified counseling: Secondary | ICD-10-CM | POA: Insufficient documentation

## 2015-05-11 DIAGNOSIS — L578 Other skin changes due to chronic exposure to nonionizing radiation: Secondary | ICD-10-CM | POA: Insufficient documentation

## 2015-05-11 DIAGNOSIS — Z0000 Encounter for general adult medical examination without abnormal findings: Secondary | ICD-10-CM | POA: Insufficient documentation

## 2015-05-11 NOTE — Assessment & Plan Note (Signed)
Patient referred to derm for this and a lesion on right ear.

## 2015-05-11 NOTE — Progress Notes (Signed)
[name] [name]  [id number] 1946-08-01 05/11/2015      Progress Note-Follow Up  Subjective  Chief Complaint  Chief Complaint  Patient presents with  . Medicare Wellness    HPI  Patient is a 69 y.o. male in today for routine medical care. Patient is in today for annual exam. His headaches have essentially resolved. He has been following with neurology but his postconcussive headaches have also improved. He is not left with any residual visual or hearing concerns. He has not yet started back on firing. Notes his blood sugars have generally been well controlled in the 95-150 range although he has had a low of 75 when he skipped a meal and a high of 280 after eating cheesecake. Has some mild urinary frequency but denies polydipsia. No recent illness. Is doing well with activities within his home. Denies CP/palp/SOB/HA/congestion/fevers/GI or GU c/o. Taking meds as prescribed  Past Medical History  Diagnosis Date  . Diabetes mellitus type II, uncontrolled   . OSA (obstructive sleep apnea)   . Hyperlipidemia   . RBBB (right bundle branch block)   . Depression     Bipolar  . Edema   . Hypertension   . Falling episodes     these have occurred in the past and again recurring 2011  . Spondylosis     C5-6, C6-7 MRI 2010  . CAD (coronary artery disease)     A. CABG in 2000,status post cardiac cath in 2006, 2009 ....continued chest pain and SOB despite oral medication adjestments including Ranexa. B. Cath November 2009/ DES  Stent...11/26/2008 to distal  RCA leading to acute marginal.  C. Cath 07/2012 for CP - stable anatomy, med rx.  Marland Kitchen Hx of CABG     2000,  / one median sternotomy suture broken her chest x-ray November, 2010, no clinical significance  . Palpitations     event recorder showed sinus rhythm  . Nephrolithiasis   . Restless leg   . Dizziness   . Shortness of breath     CPX April, 2011, mild functional limitation, no clear pulmonary or cardiac limitation, possible  deconditioning and mild chronotropic incompetence( peak heart rate 130)  . Cerebral ischemia     MRI November, 2010, chronic microvascular ischemia  . Anemia     hemoglobin 7.4, iron deficiency, January, 2011, 2 unit transfusion, endoscopy normal, capsule endoscopy February, 2011 no small bowel abnormalities.   Most likely source gastric erosions, followed by GI  . H/O medication noncompliance     Due to loss of insurance  . Ejection fraction     EF 60%, echo, July 31, 2012  . Diabetes mellitus without complication   . Pain in limb 06/12/2009    Qualifier: Diagnosis of  By: [name]   . Low back pain 06/12/2009    Qualifier: Diagnosis of  By: [name]   . Thyroid disease   . Right knee pain 01/07/2015  . Advance care planning 05/01/2015    Past Surgical History  Procedure Laterality Date  . Nasal septum surgery      UP3  . Coronary artery bypass graft      2000  . Left heart catheterization with coronary/graft angiogram N/A 08/01/2012    Procedure: LEFT HEART CATHETERIZATION WITH Beatrix Fetters;  Surgeon: [name], MD;  Location: [facility] CATH LAB;  Service: Cardiovascular;  Laterality: N/A;  . Left heart catheterization with coronary/graft angiogram N/A 01/03/2015    Procedure: LEFT HEART CATHETERIZATION  WITH Beatrix Fetters;  Surgeon: [name], MD;  Location: [facility] CATH LAB;  Service: Cardiovascular;  Laterality: N/A;    Family History  Problem Relation Age of Onset  . Cancer Brother     pancreatic cancer  . Diabetes Brother     4   . Stroke Brother   . Diabetes Brother   . Cancer Brother   . Diabetes Brother   . Coronary artery disease Brother   . Diabetes Brother   . Hypothyroidism Brother   . Heart attack      Nephew  . Diabetes Mother   . Heart failure Mother   . Heart failure Father   . Irregular heart beat Daughter   . Cancer Maternal Grandmother     unknown     History   Social History  . Marital Status: Married     Spouse Name: N/A  . Number of Children: 4  . Years of Education: 13   Occupational History  . retired    Social History Main Topics  . Smoking status: Never Smoker   . Smokeless tobacco: Never Used  . Alcohol Use: No  . Drug Use: No  . Sexual Activity:    Partners: Female   Other Topics Concern  . Not on file   Social History Narrative   Patient is right handed.   Patient does not drink caffeine.    Current Outpatient Prescriptions on File Prior to Visit  Medication Sig Dispense Refill  . aspirin EC 325 MG EC tablet Take 1 tablet (325 mg total) by mouth daily. 30 tablet 2  . gabapentin (NEURONTIN) 300 MG capsule Take 300 mg by mouth 3 (three) times daily.    . hyoscyamine (LEVSIN, ANASPAZ) 0.125 MG tablet Take 1 tablet (0.125 mg total) by mouth every 8 (eight) hours as needed for cramping. 30 tablet 0  . insulin aspart (NOVOLOG) 100 UNIT/ML injection Use 10-17 unit 3 times daily before meals 15 mL 2  . Insulin Glargine (TOUJEO SOLOSTAR) 300 UNIT/ML SOPN Inject 42 Units into the skin at bedtime. 3 pen 2  . isosorbide mononitrate (IMDUR) 120 MG 24 hr tablet Take 1 tablet (120 mg total) by mouth daily. 90 tablet 1  . nitroGLYCERIN (NITROSTAT) 0.4 MG SL tablet Place 1 tablet (0.4 mg total) under the tongue every 5 (five) minutes as needed (up to 3 doses). 25 tablet 4  . NOVOLOG FLEXPEN 100 UNIT/ML FlexPen Sliding Scale  3  . pantoprazole (PROTONIX) 40 MG tablet Take twice daily for 2 weeks to see if it helps the symptoms. 180 tablet 1  . pramipexole (MIRAPEX) 0.25 MG tablet Take 1 tablet (0.25 mg total) by mouth at bedtime. 90 tablet 1  . sertraline (ZOLOFT) 100 MG tablet Take 1 tablet (100 mg total) by mouth 2 (two) times daily. 180 tablet 1  . topiramate (TOPAMAX) 50 MG tablet Take 1 tablet (50 mg total) by mouth at bedtime. 30 tablet 3  . zolpidem (AMBIEN) 5 MG tablet Take 1 tablet (5 mg total) by mouth at bedtime as needed for sleep. 30 tablet 3   No current  facility-administered medications on file prior to visit.    Allergies  Allergen Reactions  . Morphine     REACTION: hallucinations  . Shellfish-Derived Products Itching    Review of Systems  Review of Systems  Constitutional: Negative for fever, chills and malaise/fatigue.  HENT: Negative for congestion, hearing loss and nosebleeds.   Eyes: Negative for discharge.  Respiratory: Negative  for cough, sputum production, shortness of breath and wheezing.   Cardiovascular: Negative for chest pain, palpitations and leg swelling.  Gastrointestinal: Negative for heartburn, nausea, vomiting, abdominal pain, diarrhea, constipation and blood in stool.  Genitourinary: Positive for frequency. Negative for dysuria, urgency and hematuria.  Musculoskeletal: Negative for myalgias, back pain and falls.  Skin: Negative for rash.  Neurological: Negative for dizziness, tremors, sensory change, focal weakness, loss of consciousness, weakness and headaches.  Endo/Heme/Allergies: Negative for polydipsia. Does not bruise/bleed easily.  Psychiatric/Behavioral: Negative for depression and suicidal ideas. The patient is not nervous/anxious and does not have insomnia.     Objective  BP 122/78 mmHg  Pulse 64  Temp(Src) 97.6 F (36.4 C) (Oral)  Ht 5\' 9"  (1.753 m)  Wt 174 lb 6 oz (79.096 kg)  BMI 25.74 kg/m2  SpO2 97%  Physical Exam  Physical Exam  Constitutional: He is oriented to person, place, and time and well-developed, well-nourished, and in no distress. No distress.  HENT:  Head: Normocephalic and atraumatic.  Eyes: Conjunctivae are normal.  Neck: Neck supple. No thyromegaly present.  Cardiovascular: Normal rate, regular rhythm and normal heart sounds.   No murmur heard. Pulmonary/Chest: Effort normal and breath sounds normal. No respiratory distress.  Abdominal: He exhibits no distension and no mass. There is no tenderness.  Musculoskeletal: He exhibits no edema.  Neurological: He is alert  and oriented to person, place, and time.  Skin: Skin is warm.  Psychiatric: Memory, affect and judgment normal.    Lab Results  Component Value Date   TSH 2.84 05/01/2015   Lab Results  Component Value Date   WBC 7.5 05/01/2015   HGB 15.8 05/01/2015   HCT 46.8 05/01/2015   MCV 89.0 05/01/2015   PLT 212.0 05/01/2015   Lab Results  Component Value Date   CREATININE 0.95 05/01/2015   BUN 10 05/01/2015   NA 136 05/01/2015   K 4.3 05/01/2015   CL 101 05/01/2015   CO2 29 05/01/2015   Lab Results  Component Value Date   ALT 18 05/01/2015   AST 21 05/01/2015   ALKPHOS 93 05/01/2015   BILITOT 0.6 05/01/2015   Lab Results  Component Value Date   CHOL 162 05/01/2015   Lab Results  Component Value Date   HDL 33.90* 05/01/2015   Lab Results  Component Value Date   LDLCALC 107* 05/01/2015   Lab Results  Component Value Date   TRIG 107.0 05/01/2015   Lab Results  Component Value Date   CHOLHDL 5 05/01/2015     Assessment & Plan  Hypertension Well controlled, no changes to meds. Encouraged heart healthy diet such as the DASH diet and exercise as tolerated.    GERD Avoid offending foods, start probiotics. Do not eat large meals in late evening and consider raising head of bed.    Palpitations No c/o   Hyperlipidemia Tolerating statin, encouraged heart healthy diet, avoid trans fats, minimize simple carbs and saturated fats. Increase exercise as tolerated   Diabetes mellitus type II, uncontrolled hgba1c unacceptable, minimize simple carbs. Increase exercise as tolerated. Continue current meds for now and recheck hgba1c today   Advance care planning  Given and reviewed copy of ACP documents from [place] of State and encouraged to complete and return    Medicare annual wellness visit, initial Patient denies any difficulties at home. No trouble with ADLs, depression or falls. No recent changes to vision or hearing. Is UTD with immunizations. Is UTD  with screening. Discussed  Advanced Directives, patient agrees to bring [place] copies of documents if can. Encouraged heart healthy diet, exercise as tolerated and adequate sleep. Doing well at home with bill paying, self care etc. Labs reviewed  Given and reviewed copy of ACP documents  Medication: Reviewed with patient and updated- did not get through full list patient stated nothing changed and he would look at it tomorrow  Preferred Pharmacy: [id number] - [place], [place] - [place] verified:   Immunization Status: Flu vaccine-- 09/04/14 Tdap-- 12/18/07 PNA-- 01/03/15  Shingles-- 11/30/12   A/P:  Changes to [place], PSH or Personal Hx: UTD  CCS: 02/10/09 per patient normal  PSA: none found  Bone Density: none found   Care Teams Updated: Metta Clines, MD - Neurology  ED/Hospital/Urgent Care Visits: 03/28/15 for hyperglycemia  See problem list for risk factors See AVS for preventative healthcare recommendations   Sun-damaged skin Patient referred to derm for this and a lesion on right ear.

## 2015-05-11 NOTE — Assessment & Plan Note (Addendum)
Patient denies any difficulties at home. No trouble with ADLs, depression or falls. No recent changes to vision or hearing. Is UTD with immunizations. Is UTD with screening. Discussed Advanced Directives, patient agrees to bring [place] copies of documents if can. Encouraged heart healthy diet, exercise as tolerated and adequate sleep. Doing well at home with bill paying, self care etc. Labs reviewed  Given and reviewed copy of ACP documents  Medication: Reviewed with patient and updated- did not get through full list patient stated nothing changed and he would look at it tomorrow  Preferred Pharmacy: [facility] [id number] - [place], [place] - [place] verified:   Immunization Status: Flu vaccine-- 09/04/14 Tdap-- 12/18/07 PNA-- 01/03/15  Shingles-- 11/30/12   A/P:  Changes to [place], PSH or Personal Hx: UTD  CCS: 02/10/09 per patient normal  PSA: none found  Bone Density: none found   Care Teams Updated: Metta Clines, MD - Neurology  ED/Hospital/Urgent Care Visits: 03/28/15 for hyperglycemia  See problem list for risk factors See AVS for preventative healthcare recommendations

## 2015-05-11 NOTE — Assessment & Plan Note (Deleted)
Given and reviewed copy of ACP documents from [employer] and encouraged to complete and return

## 2015-05-29 ENCOUNTER — Emergency Department (HOSPITAL_COMMUNITY): Payer: Medicare PPO

## 2015-05-29 ENCOUNTER — Observation Stay (HOSPITAL_COMMUNITY)
Admission: EM | Admit: 2015-05-29 | Discharge: 2015-05-30 | Disposition: A | Discharge status: Home / Self Care | Payer: Medicare PPO | Attending: Cardiovascular Disease | Admitting: Cardiovascular Disease

## 2015-05-29 ENCOUNTER — Encounter (HOSPITAL_COMMUNITY): Payer: Self-pay

## 2015-05-29 DIAGNOSIS — I10 Essential (primary) hypertension: Secondary | ICD-10-CM | POA: Diagnosis not present

## 2015-05-29 DIAGNOSIS — R079 Chest pain, unspecified: Secondary | ICD-10-CM | POA: Diagnosis present

## 2015-05-29 DIAGNOSIS — E785 Hyperlipidemia, unspecified: Secondary | ICD-10-CM | POA: Diagnosis not present

## 2015-05-29 DIAGNOSIS — I209 Angina pectoris, unspecified: Secondary | ICD-10-CM | POA: Diagnosis not present

## 2015-05-29 DIAGNOSIS — G4733 Obstructive sleep apnea (adult) (pediatric): Secondary | ICD-10-CM | POA: Insufficient documentation

## 2015-05-29 DIAGNOSIS — I2510 Atherosclerotic heart disease of native coronary artery without angina pectoris: Secondary | ICD-10-CM | POA: Diagnosis present

## 2015-05-29 DIAGNOSIS — Z7982 Long term (current) use of aspirin: Secondary | ICD-10-CM | POA: Diagnosis not present

## 2015-05-29 DIAGNOSIS — G2581 Restless legs syndrome: Secondary | ICD-10-CM | POA: Insufficient documentation

## 2015-05-29 DIAGNOSIS — Z951 Presence of aortocoronary bypass graft: Secondary | ICD-10-CM

## 2015-05-29 DIAGNOSIS — I25110 Atherosclerotic heart disease of native coronary artery with unstable angina pectoris: Principal | ICD-10-CM | POA: Insufficient documentation

## 2015-05-29 DIAGNOSIS — I200 Unstable angina: Secondary | ICD-10-CM

## 2015-05-29 DIAGNOSIS — E1165 Type 2 diabetes mellitus with hyperglycemia: Secondary | ICD-10-CM | POA: Insufficient documentation

## 2015-05-29 DIAGNOSIS — I959 Hypotension, unspecified: Secondary | ICD-10-CM | POA: Diagnosis not present

## 2015-05-29 DIAGNOSIS — T82857A Stenosis of cardiac prosthetic devices, implants and grafts, initial encounter: Secondary | ICD-10-CM | POA: Insufficient documentation

## 2015-05-29 DIAGNOSIS — Z791 Long term (current) use of non-steroidal anti-inflammatories (NSAID): Secondary | ICD-10-CM | POA: Insufficient documentation

## 2015-05-29 DIAGNOSIS — I4510 Unspecified right bundle-branch block: Secondary | ICD-10-CM | POA: Diagnosis not present

## 2015-05-29 DIAGNOSIS — E039 Hypothyroidism, unspecified: Secondary | ICD-10-CM | POA: Insufficient documentation

## 2015-05-29 DIAGNOSIS — Z794 Long term (current) use of insulin: Secondary | ICD-10-CM | POA: Insufficient documentation

## 2015-05-29 DIAGNOSIS — R42 Dizziness and giddiness: Secondary | ICD-10-CM

## 2015-05-29 DIAGNOSIS — IMO0002 Reserved for concepts with insufficient information to code with codable children: Secondary | ICD-10-CM | POA: Diagnosis present

## 2015-05-29 DIAGNOSIS — Z955 Presence of coronary angioplasty implant and graft: Secondary | ICD-10-CM

## 2015-05-29 DIAGNOSIS — Z79899 Other long term (current) drug therapy: Secondary | ICD-10-CM | POA: Diagnosis not present

## 2015-05-29 DIAGNOSIS — F319 Bipolar disorder, unspecified: Secondary | ICD-10-CM | POA: Diagnosis not present

## 2015-05-29 LAB — CREATININE, SERUM
Creatinine, Ser: 1.1 mg/dL (ref 0.61–1.24)
GFR calc Af Amer: >60 mL/min (ref >60)
GFR calc non Af Amer: >60 mL/min (ref >60)

## 2015-05-29 LAB — GLUCOSE, CAPILLARY
GLUCOSE-CAPILLARY: 202 mg/dL — AB (ref 65–99)
GLUCOSE-CAPILLARY: 473 mg/dL — AB (ref 65–99)

## 2015-05-29 LAB — CBC
HCT: 39 % (ref 39.0–52.0)
HEMOGLOBIN: 13.9 g/dL (ref 13.0–17.0)
MCH: 30 pg (ref 26.0–34.0)
MCHC: 35.6 g/dL (ref 30.0–36.0)
MCV: 84.1 fL (ref 78.0–100.0)
Platelets: 149 10*3/uL — ABNORMAL LOW (ref 150–400)
RBC: 4.64 MIL/uL (ref 4.22–5.81)
RDW: 13.2 % (ref 11.5–15.5)
WBC: 8 10*3/uL (ref 4.0–10.5)

## 2015-05-29 LAB — BRAIN NATRIURETIC PEPTIDE: B Natriuretic Peptide: 22.6 pg/mL (ref 0.0–100.0)

## 2015-05-29 LAB — APTT: aPTT: 29 seconds (ref 24–37)

## 2015-05-29 LAB — BASIC METABOLIC PANEL
ANION GAP: 8 (ref 5–15)
BUN: 11 mg/dL (ref 6–20)
CO2: 23 mmol/L (ref 22–32)
Calcium: 9 mg/dL (ref 8.9–10.3)
Chloride: 104 mmol/L (ref 101–111)
Creatinine, Ser: 1.06 mg/dL (ref 0.61–1.24)
Glucose, Bld: 313 mg/dL — ABNORMAL HIGH (ref 65–99)
POTASSIUM: 4.3 mmol/L (ref 3.5–5.1)
Sodium: 135 mmol/L (ref 135–145)

## 2015-05-29 LAB — I-STAT TROPONIN, ED: Troponin i, poc: 0 ng/mL (ref 0.00–0.08)

## 2015-05-29 LAB — TROPONIN I

## 2015-05-29 LAB — PROTIME-INR
INR: 1.15 (ref 0.00–1.49)
PROTHROMBIN TIME: 14.9 s (ref 11.6–15.2)

## 2015-05-29 MED ORDER — FUROSEMIDE 20 MG PO TABS
20.0000 mg | ORAL_TABLET | Freq: Every day | ORAL | Status: DC
  Administered 2015-05-30: 20 mg via ORAL
  Filled 2015-05-29: qty 1

## 2015-05-29 MED ORDER — TOPIRAMATE 25 MG PO TABS
50.0000 mg | ORAL_TABLET | Freq: Every day | ORAL | Status: DC | PRN
  Filled 2015-05-29: qty 2

## 2015-05-29 MED ORDER — NITROGLYCERIN 0.4 MG SL SUBL: 0.4000 mg | SUBLINGUAL_TABLET | SUBLINGUAL | Status: DC | PRN

## 2015-05-29 MED ORDER — NITROGLYCERIN 0.4 MG SL SUBL
0.4000 mg | SUBLINGUAL_TABLET | SUBLINGUAL | Status: DC | PRN
  Filled 2015-05-29: qty 1

## 2015-05-29 MED ORDER — LEVOTHYROXINE SODIUM 75 MCG PO TABS
75.0000 ug | ORAL_TABLET | Freq: Every day | ORAL | Status: DC
  Administered 2015-05-30: 75 ug via ORAL
  Filled 2015-05-29 (×2): qty 1

## 2015-05-29 MED ORDER — ACETAMINOPHEN 325 MG PO TABS
650.0000 mg | ORAL_TABLET | ORAL | Status: DC | PRN
  Administered 2015-05-29: 650 mg via ORAL
  Filled 2015-05-29: qty 2

## 2015-05-29 MED ORDER — INSULIN ASPART 100 UNIT/ML ~~LOC~~ SOLN
15.0000 [IU] | Freq: Once | SUBCUTANEOUS | Status: AC
  Administered 2015-05-29: 15 [IU] via SUBCUTANEOUS

## 2015-05-29 MED ORDER — ISOSORBIDE MONONITRATE ER 60 MG PO TB24
120.0000 mg | ORAL_TABLET | Freq: Every day | ORAL | Status: DC
  Administered 2015-05-30: 120 mg via ORAL
  Filled 2015-05-29: qty 2

## 2015-05-29 MED ORDER — SODIUM CHLORIDE 0.9 % IV SOLN: 250.0000 mL | INTRAVENOUS | Status: DC | PRN

## 2015-05-29 MED ORDER — INSULIN GLARGINE 100 UNIT/ML ~~LOC~~ SOLN
42.0000 [IU] | Freq: Every day | SUBCUTANEOUS | Status: DC
  Administered 2015-05-30: 42 [IU] via SUBCUTANEOUS
  Filled 2015-05-29 (×3): qty 0.42

## 2015-05-29 MED ORDER — ASPIRIN EC 81 MG PO TBEC
81.0000 mg | DELAYED_RELEASE_TABLET | Freq: Every day | ORAL | Status: DC
  Filled 2015-05-29: qty 1

## 2015-05-29 MED ORDER — SODIUM CHLORIDE 0.9 % IV SOLN
INTRAVENOUS | Status: DC
  Administered 2015-05-30: 04:00:00 via INTRAVENOUS

## 2015-05-29 MED ORDER — PANTOPRAZOLE SODIUM 40 MG PO TBEC
40.0000 mg | DELAYED_RELEASE_TABLET | Freq: Every day | ORAL | Status: DC
  Administered 2015-05-29 – 2015-05-30 (×2): 40 mg via ORAL
  Filled 2015-05-29 (×2): qty 1

## 2015-05-29 MED ORDER — ASPIRIN 81 MG PO CHEW
81.0000 mg | CHEWABLE_TABLET | ORAL | Status: AC
  Administered 2015-05-30: 81 mg via ORAL
  Filled 2015-05-29: qty 1

## 2015-05-29 MED ORDER — INSULIN ASPART 100 UNIT/ML ~~LOC~~ SOLN: 10.0000 [IU] | Freq: Three times a day (TID) | SUBCUTANEOUS | Status: DC

## 2015-05-29 MED ORDER — INSULIN ASPART 100 UNIT/ML ~~LOC~~ SOLN
0.0000 [IU] | Freq: Three times a day (TID) | SUBCUTANEOUS | Status: DC
  Administered 2015-05-30: 3 [IU] via SUBCUTANEOUS
  Administered 2015-05-30: 5 [IU] via SUBCUTANEOUS

## 2015-05-29 MED ORDER — SERTRALINE HCL 100 MG PO TABS
100.0000 mg | ORAL_TABLET | Freq: Two times a day (BID) | ORAL | Status: DC
  Administered 2015-05-29 – 2015-05-30 (×2): 100 mg via ORAL
  Filled 2015-05-29 (×3): qty 1

## 2015-05-29 MED ORDER — GABAPENTIN 300 MG PO CAPS
300.0000 mg | ORAL_CAPSULE | Freq: Three times a day (TID) | ORAL | Status: DC
  Administered 2015-05-29 – 2015-05-30 (×4): 300 mg via ORAL
  Filled 2015-05-29 (×5): qty 1

## 2015-05-29 MED ORDER — ONDANSETRON HCL 4 MG/2ML IJ SOLN: 4.0000 mg | Freq: Four times a day (QID) | INTRAMUSCULAR | Status: DC | PRN

## 2015-05-29 MED ORDER — SODIUM CHLORIDE 0.9 % IJ SOLN: 3.0000 mL | INTRAMUSCULAR | Status: DC | PRN

## 2015-05-29 MED ORDER — HEPARIN SODIUM (PORCINE) 5000 UNIT/ML IJ SOLN
5000.0000 [IU] | Freq: Three times a day (TID) | INTRAMUSCULAR | Status: DC
  Administered 2015-05-29 – 2015-05-30 (×4): 5000 [IU] via SUBCUTANEOUS
  Filled 2015-05-29 (×5): qty 1

## 2015-05-29 MED ORDER — ASPIRIN 81 MG PO CHEW
324.0000 mg | CHEWABLE_TABLET | Freq: Once | ORAL | Status: DC
  Filled 2015-05-29: qty 4

## 2015-05-29 MED ORDER — HYOSCYAMINE SULFATE 0.125 MG PO TABS
0.1250 mg | ORAL_TABLET | Freq: Three times a day (TID) | ORAL | Status: DC | PRN
  Filled 2015-05-29: qty 1

## 2015-05-29 MED ORDER — NAPROXEN 250 MG PO TABS
250.0000 mg | ORAL_TABLET | Freq: Once | ORAL | Status: AC
  Administered 2015-05-29: 250 mg via ORAL
  Filled 2015-05-29: qty 1

## 2015-05-29 MED ORDER — INSULIN GLARGINE 100 UNITS/ML SOLOSTAR PEN
42.0000 [IU] | PEN_INJECTOR | Freq: Every day | SUBCUTANEOUS | Status: DC
  Filled 2015-05-29: qty 3

## 2015-05-29 MED ORDER — SODIUM CHLORIDE 0.9 % IJ SOLN
3.0000 mL | Freq: Two times a day (BID) | INTRAMUSCULAR | Status: DC
  Administered 2015-05-29 – 2015-05-30 (×2): 3 mL via INTRAVENOUS

## 2015-05-29 MED ORDER — PRAMIPEXOLE DIHYDROCHLORIDE 0.25 MG PO TABS
0.2500 mg | ORAL_TABLET | Freq: Every day | ORAL | Status: DC
  Administered 2015-05-29: 0.25 mg via ORAL
  Filled 2015-05-29 (×2): qty 1

## 2015-05-29 MED ORDER — SIMVASTATIN 40 MG PO TABS
40.0000 mg | ORAL_TABLET | Freq: Every evening | ORAL | Status: DC
  Administered 2015-05-29: 40 mg via ORAL
  Filled 2015-05-29 (×2): qty 1

## 2015-05-29 NOTE — ED Provider Notes (Signed)
CSN: [id number]     Arrival date & time 05/29/15  [id number] History   First MD Initiated Contact with Patient 05/29/15 1002     Chief Complaint  Patient presents with  . Chest Pain     (Consider location/radiation/quality/duration/timing/severity/associated sxs/prior Treatment) HPI  69 year old male with a history of coronary disease status post coronary artery bypass grafting in the year 2000, multiple heart catheterizations since that time including 2006, 2009 and most recently 2013 which showed patent grafts, no other obvious acute obstructions, the stent to the PDA was wide open at that time. He presents today with recurrent chest pain.  He reports that over the last several days he has had intermittent left-sided chest pain which she describes as a cramping or aching pain in the left upper chest, sometimes it is sharp and stabbing, it recurred again this morning while he was trying to help his grandchild eat breakfast. At this time that pain is gone away. Each time it is associated with a feeling of lightheadedness or near syncope, he has not passed out. He had taken a nitroglycerin and aspirin prior to EMS arrival, his pain is significant only improved after that time. He is followed very closely by Dr. [name] with cardiology.  Past Medical History  Diagnosis Date  . Diabetes mellitus type II, uncontrolled   . OSA (obstructive sleep apnea)   . Hyperlipidemia   . RBBB (right bundle branch block)   . Depression     Bipolar  . Edema   . Hypertension   . Falling episodes     these have occurred in the past and again recurring 2011  . Spondylosis     C5-6, C6-7 MRI 2010  . CAD (coronary artery disease)     A. CABG in 2000,status post cardiac cath in 2006, 2009 ....continued chest pain and SOB despite oral medication adjestments including Ranexa. B. Cath November 2009/ DES  Stent...11/26/2008 to distal  RCA leading to acute marginal.  C. Cath 07/2012 for CP - stable anatomy, med rx.  Marland Kitchen Hx of  CABG     2000,  / one median sternotomy suture broken her chest x-ray November, 2010, no clinical significance  . Palpitations     event recorder showed sinus rhythm  . Nephrolithiasis   . Restless leg   . Dizziness   . Shortness of breath     CPX April, 2011, mild functional limitation, no clear pulmonary or cardiac limitation, possible deconditioning and mild chronotropic incompetence( peak heart rate 130)  . Cerebral ischemia     MRI November, 2010, chronic microvascular ischemia  . Anemia     hemoglobin 7.4, iron deficiency, January, 2011, 2 unit transfusion, endoscopy normal, capsule endoscopy February, 2011 no small bowel abnormalities.   Most likely source gastric erosions, followed by GI  . H/O medication noncompliance     Due to loss of insurance  . Ejection fraction     EF 60%, echo, July 31, 2012  . Diabetes mellitus without complication   . Pain in limb 06/12/2009    Qualifier: Diagnosis of  By: [name]   . Low back pain 06/12/2009    Qualifier: Diagnosis of  By: [name]   . Thyroid disease   . Right knee pain 01/07/2015  . Advance care planning 05/01/2015  . ACP (advance care planning) 05/11/2015   Past Surgical History  Procedure Laterality Date  . Nasal septum surgery      UP3  .  Coronary artery bypass graft      2000  . Left heart catheterization with coronary/graft angiogram N/A 08/01/2012    Procedure: LEFT HEART CATHETERIZATION WITH Beatrix Fetters;  Surgeon: [name], MD;  Location: [facility] CATH LAB;  Service: Cardiovascular;  Laterality: N/A;  . Left heart catheterization with coronary/graft angiogram N/A 01/03/2015    Procedure: LEFT HEART CATHETERIZATION WITH Beatrix Fetters;  Surgeon: [name], MD;  Location: [facility] CATH LAB;  Service: Cardiovascular;  Laterality: N/A;   Family History  Problem Relation Age of Onset  . Cancer Brother     pancreatic cancer  . Diabetes Brother     4   . Stroke Brother   . Diabetes  Brother   . Cancer Brother   . Diabetes Brother   . Coronary artery disease Brother   . Diabetes Brother   . Hypothyroidism Brother   . Heart attack      Nephew  . Diabetes Mother   . Heart failure Mother   . Heart failure Father   . Irregular heart beat Daughter   . Cancer Maternal Grandmother     unknown    History  Substance Use Topics  . Smoking status: Never Smoker   . Smokeless tobacco: Never Used  . Alcohol Use: No    Review of Systems  All other systems reviewed and are negative.     Allergies  Morphine and Shellfish-derived products  Home Medications   Prior to Admission medications   Medication Sig Start Date End Date Taking? Authorizing Provider  aspirin EC 325 MG EC tablet Take 1 tablet (325 mg total) by mouth daily. 01/04/15  Yes Bonnielee Haff, MD  clotrimazole-betamethasone (LOTRISONE) cream APPLY TO THE AFFECTED TWICE DAILY 05/01/15  Yes [name], MD  diclofenac sodium (VOLTAREN) 1 % GEL Apply 2 g topically 3 (three) times daily as needed. For pain to affected area 05/01/15  Yes [name], MD  furosemide (LASIX) 20 MG tablet Take 1 tablet (20 mg total) by mouth daily. 05/01/15  Yes [name], MD  gabapentin (NEURONTIN) 300 MG capsule Take 300 mg by mouth 3 (three) times daily.   Yes Historical Provider, MD  hyoscyamine (LEVSIN, ANASPAZ) 0.125 MG tablet Take 1 tablet (0.125 mg total) by mouth every 8 (eight) hours as needed for cramping. 03/06/13  Yes Doe-Hyun R Shawna Orleans, DO  insulin aspart (NOVOLOG) 100 UNIT/ML injection Use 10-17 unit 3 times daily before meals Patient taking differently: Inject 10-17 Units into the skin 3 (three) times daily with meals. Use 10-17 unit 3 times daily before meals 12/12/14  Yes Philemon Kingdom, MD  Insulin Glargine (TOUJEO SOLOSTAR) 300 UNIT/ML SOPN Inject 42 Units into the skin at bedtime. 02/20/15  Yes [name], MD  isosorbide mononitrate (IMDUR) 120 MG 24 hr tablet Take 1 tablet (120 mg total) by mouth daily.  05/08/13  Yes Doe-Hyun Kyra Searles, DO  levothyroxine (SYNTHROID, LEVOTHROID) 75 MCG tablet Take 1 tablet (75 mcg total) by mouth daily. 05/01/15  Yes [name], MD  metFORMIN (GLUCOPHAGE) 1000 MG tablet Take 1 tablet (1,000 mg total) by mouth 2 (two) times daily with a meal. Please resume on 01/05/15 05/01/15  Yes [name], MD  nitroGLYCERIN (NITROSTAT) 0.4 MG SL tablet Place 1 tablet (0.4 mg total) under the tongue every 5 (five) minutes as needed (up to 3 doses). 05/08/13  Yes Doe-Hyun R Shawna Orleans, DO  pantoprazole (PROTONIX) 40 MG tablet Take twice daily for 2 weeks  to see if it helps the symptoms. Patient taking differently: Take 40 mg by mouth daily.  01/31/15  Yes [name], MD  pramipexole (MIRAPEX) 0.25 MG tablet Take 1 tablet (0.25 mg total) by mouth at bedtime. 05/08/13  Yes Doe-Hyun R Shawna Orleans, DO  sertraline (ZOLOFT) 100 MG tablet Take 1 tablet (100 mg total) by mouth 2 (two) times daily. 05/07/13  Yes Doe-Hyun R Shawna Orleans, DO  simvastatin (ZOCOR) 40 MG tablet Take 1 tablet (40 mg total) by mouth every evening. 05/01/15  Yes [name], MD  topiramate (TOPAMAX) 50 MG tablet Take 1 tablet (50 mg total) by mouth at bedtime. Patient taking differently: Take 50 mg by mouth daily as needed (headache).  04/25/15  Yes Adam R Jaffe, DO   BP 109/57 mmHg  Pulse 59  Temp(Src) 97.8 F (36.6 C) (Oral)  Resp 18  SpO2 96% Physical Exam  Constitutional: He appears well-developed and well-nourished. No distress.  HENT:  Head: Normocephalic and atraumatic.  Mouth/Throat: Oropharynx is clear and moist. No oropharyngeal exudate.  Eyes: Conjunctivae and EOM are normal. Pupils are equal, round, and reactive to light. Right eye exhibits no discharge. Left eye exhibits no discharge. No scleral icterus.  Neck: Normal range of motion. Neck supple. No JVD present. No thyromegaly present.  Cardiovascular: Normal rate, regular rhythm, normal heart sounds and intact distal pulses.  Exam reveals no gallop and no friction rub.    No murmur heard. Pulmonary/Chest: Effort normal and breath sounds normal. No respiratory distress. He has no wheezes. He has no rales.  Abdominal: Soft. Bowel sounds are normal. He exhibits no distension and no mass. There is no tenderness.  Musculoskeletal: Normal range of motion. He exhibits no edema or tenderness.  Lymphadenopathy:    He has no cervical adenopathy.  Neurological: He is alert. Coordination normal.  Skin: Skin is warm and dry. No rash noted. No erythema.  Psychiatric: He has a normal mood and affect. His behavior is normal.  Nursing note and vitals reviewed.   ED Course  Procedures (including critical care time) Labs Review Labs Reviewed  CBC - Abnormal; Notable for the following:    Platelets 149 (*)    All other components within normal limits  BASIC METABOLIC PANEL - Abnormal; Notable for the following:    Glucose, Bld 313 (*)    All other components within normal limits  BRAIN NATRIURETIC PEPTIDE  APTT  PROTIME-INR  I-STAT TROPOININ, ED  I-STAT TROPOININ, ED    Imaging Review Dg Chest Portable 1 View  05/29/2015   CLINICAL DATA:  Central chest pain, cough, and shortness of breath for 3 days.  EXAM: PORTABLE CHEST - 1 VIEW  COMPARISON:  01/02/2015  FINDINGS: Sequelae of prior CABG are again identified. Cardiac silhouette is unchanged and likely within normal limits in size for portable AP technique and degree of lung inflation. The patient has taken a shallower inspiration than on the prior study. Multiple external monitoring leads overlie the chest, partially obscuring the right greater than left lung bases. Allowing for this, no airspace consolidation, edema, pleural effusion, or pneumothorax is identified. No acute osseous abnormality is seen.  IMPRESSION: No active disease.   Electronically Signed   By: Logan Bores   On: 05/29/2015 11:26     EKG Interpretation   Date/Time:  Thursday May 29 2015 09:57:03 EDT Ventricular Rate:  62 PR Interval:   179 QRS Duration: 128 QT Interval:  460 QTC Calculation: 467 R Axis:   42  Text Interpretation:  Sinus rhythm Right bundle branch block Abnormal ekg  since last tracing no significant change Confirmed by Sabra Heck  MD, Henrietta Cieslewicz  [phone]) on 05/29/2015 10:07:16 AM      MDM   Final diagnoses:  Unstable angina    The patient's physical exam is unremarkable, bedside ultrasound reveals no signs of abdominal aortic aneurysm, his EKG is unchanged and shows a right bundle branch block, he will need blood work to rule out myocardial infarction, cardiology consultation and admission for rule out as he does have a significant history and his symptoms may represent unstable angina. The patient has been informed of the workup and plan and is in agreement.  Discussed with the cardiology physician assistant who will come to see the patient in consultation for admission. Suspect unstable angina.  Noemi Chapel, MD 05/29/15 [phone]

## 2015-05-29 NOTE — H&P (Signed)
Patient ID: [name] [name] MRN: [id number], DOB/AGE: Jun 02, 1946   Admit date: 05/29/2015   Primary Physician: Penni Homans, MD Primary Cardiologist: Dr. [name]  Pt. Profile:   69 year old Caucasian male with past medical history of OSA, chronic RBBB, HTN, HLD, DM, and CAD s/p CABG 2000 (LIMA to LAD, SVG to OM, SAVG to D1, SVG to PLA) present with CP.  Problem List  Past Medical History  Diagnosis Date  . Diabetes mellitus type II, uncontrolled   . OSA (obstructive sleep apnea)   . Hyperlipidemia   . RBBB (right bundle branch block)   . Depression     Bipolar  . Edema   . Hypertension   . Falling episodes     these have occurred in the past and again recurring 2011  . Spondylosis     C5-6, C6-7 MRI 2010  . CAD (coronary artery disease)     A. CABG in 2000,status post cardiac cath in 2006, 2009 ....continued chest pain and SOB despite oral medication adjestments including Ranexa. B. Cath November 2009/ DES  Stent...11/26/2008 to distal  RCA leading to acute marginal.  C. Cath 07/2012 for CP - stable anatomy, med rx.  Marland Kitchen Hx of CABG     2000,  / one median sternotomy suture broken her chest x-ray November, 2010, no clinical significance  . Palpitations     event recorder showed sinus rhythm  . Nephrolithiasis   . Restless leg   . Dizziness   . Shortness of breath     CPX April, 2011, mild functional limitation, no clear pulmonary or cardiac limitation, possible deconditioning and mild chronotropic incompetence( peak heart rate 130)  . Cerebral ischemia     MRI November, 2010, chronic microvascular ischemia  . Anemia     hemoglobin 7.4, iron deficiency, January, 2011, 2 unit transfusion, endoscopy normal, capsule endoscopy February, 2011 no small bowel abnormalities.   Most likely source gastric erosions, followed by GI  . H/O medication noncompliance     Due to loss of insurance  . Ejection fraction     EF 60%, echo, July 31, 2012  . Diabetes mellitus without  complication   . Pain in limb 06/12/2009    Qualifier: Diagnosis of  By: [name]   . Low back pain 06/12/2009    Qualifier: Diagnosis of  By: [name]   . Thyroid disease   . Right knee pain 01/07/2015  . Advance care planning 05/01/2015  . ACP (advance care planning) 05/11/2015    Past Surgical History  Procedure Laterality Date  . Nasal septum surgery      UP3  . Coronary artery bypass graft      2000  . Left heart catheterization with coronary/graft angiogram N/A 08/01/2012    Procedure: LEFT HEART CATHETERIZATION WITH Beatrix Fetters;  Surgeon: [name], MD;  Location: [facility] CATH LAB;  Service: Cardiovascular;  Laterality: N/A;  . Left heart catheterization with coronary/graft angiogram N/A 01/03/2015    Procedure: LEFT HEART CATHETERIZATION WITH Beatrix Fetters;  Surgeon: [name], MD;  Location: [facility] CATH LAB;  Service: Cardiovascular;  Laterality: N/A;     Allergies  Allergies  Allergen Reactions  . Morphine     REACTION: hallucinations  . Shellfish-Derived Products Itching    HPI  The patient is a 69 year old Caucasian male with past medical history of OSA, chronic RBBB, HTN, HLD, DM, and CAD s/p CABG 2000 (LIMA to LAD, SVG to OM, SAVG  to D1, SVG to PLA). He had multiple PCI since that time in 2006, 2009, 2013 and 2016. According to the patient, Myoview does does not work for him as he had negative Myoview 4 days prior to his original MI and being told that he needed bypass surgery. His last Myoview was in 2006 which was negative. He had stable cath with stable grafts in both 2013 and most recently January 2016. It was noted the patient does have significant proximal LAD disease which has been stable on 2 previous cardiac catheterizations. He also has patent LIMA to LAD. According to the patient, his anginal manifestation is left-sided sharp aching chest pain. Since his last cardiac catheterization in 2016, has been doing well. He also has  been compliant with his medications, although does occasional miss his insulin.  He started having recurrent chest pain roughly 3-4 days ago which occur both at rest and with exertion. This morning, while making breakfast cereal for his grandchild, he had significant midsternal sharp chest pain radiating to left shoulder while lifting his grandchild. He also endorsed significant dizziness and shortness of breath. He went to the bathroom and CP became worse, so he sat down and took sublingual nitroglycerin with some improvement. However the chest discomfort persisted prompting the patient to call EMS who gave him some nitroglycerin and aspirin with resolution of symptoms. On arrival to [facility], he had normal CBC and BMP. Chest x-ray is normal. Troponins negative. EKG showed normal sinus rhythm with chronic RBBB and no acute changes. Cardiology has been consulted for chest pain.  Home Medications  Prior to Admission medications   Medication Sig Start Date End Date Taking? Authorizing Provider  aspirin EC 325 MG EC tablet Take 1 tablet (325 mg total) by mouth daily. 01/04/15  Yes Bonnielee Haff, MD  clotrimazole-betamethasone (LOTRISONE) cream APPLY TO THE AFFECTED TWICE DAILY 05/01/15  Yes [name], MD  diclofenac sodium (VOLTAREN) 1 % GEL Apply 2 g topically 3 (three) times daily as needed. For pain to affected area 05/01/15  Yes [name], MD  furosemide (LASIX) 20 MG tablet Take 1 tablet (20 mg total) by mouth daily. 05/01/15  Yes [name], MD  gabapentin (NEURONTIN) 300 MG capsule Take 300 mg by mouth 3 (three) times daily.   Yes Historical Provider, MD  hyoscyamine (LEVSIN, ANASPAZ) 0.125 MG tablet Take 1 tablet (0.125 mg total) by mouth every 8 (eight) hours as needed for cramping. 03/06/13  Yes Doe-Hyun R Shawna Orleans, DO  insulin aspart (NOVOLOG) 100 UNIT/ML injection Use 10-17 unit 3 times daily before meals Patient taking differently: Inject 10-17 Units into the skin 3 (three)  times daily with meals. Use 10-17 unit 3 times daily before meals 12/12/14  Yes Philemon Kingdom, MD  Insulin Glargine (TOUJEO SOLOSTAR) 300 UNIT/ML SOPN Inject 42 Units into the skin at bedtime. 02/20/15  Yes [name], MD  isosorbide mononitrate (IMDUR) 120 MG 24 hr tablet Take 1 tablet (120 mg total) by mouth daily. 05/08/13  Yes Doe-Hyun Kyra Searles, DO  levothyroxine (SYNTHROID, LEVOTHROID) 75 MCG tablet Take 1 tablet (75 mcg total) by mouth daily. 05/01/15  Yes [name], MD  metFORMIN (GLUCOPHAGE) 1000 MG tablet Take 1 tablet (1,000 mg total) by mouth 2 (two) times daily with a meal. Please resume on 01/05/15 05/01/15  Yes [name], MD  nitroGLYCERIN (NITROSTAT) 0.4 MG SL tablet Place 1 tablet (0.4 mg total) under the tongue every 5 (five) minutes as needed (  up to 3 doses). 05/08/13  Yes Doe-Hyun R Shawna Orleans, DO  pantoprazole (PROTONIX) 40 MG tablet Take twice daily for 2 weeks to see if it helps the symptoms. Patient taking differently: Take 40 mg by mouth daily.  01/31/15  Yes [name], MD  pramipexole (MIRAPEX) 0.25 MG tablet Take 1 tablet (0.25 mg total) by mouth at bedtime. 05/08/13  Yes Doe-Hyun R Shawna Orleans, DO  sertraline (ZOLOFT) 100 MG tablet Take 1 tablet (100 mg total) by mouth 2 (two) times daily. 05/07/13  Yes Doe-Hyun R Shawna Orleans, DO  simvastatin (ZOCOR) 40 MG tablet Take 1 tablet (40 mg total) by mouth every evening. 05/01/15  Yes [name], MD  topiramate (TOPAMAX) 50 MG tablet Take 1 tablet (50 mg total) by mouth at bedtime. Patient taking differently: Take 50 mg by mouth daily as needed (headache).  04/25/15  Yes Pieter Partridge, DO    Family History  Family History  Problem Relation Age of Onset  . Cancer Brother     pancreatic cancer  . Diabetes Brother     4   . Stroke Brother   . Diabetes Brother   . Cancer Brother   . Diabetes Brother   . Coronary artery disease Brother   . Diabetes Brother   . Hypothyroidism Brother   . Heart attack      Nephew  . Diabetes Mother   .  Heart failure Mother   . Heart failure Father   . Irregular heart beat Daughter   . Cancer Maternal Grandmother     unknown     Social History  History   Social History  . Marital Status: Married    Spouse Name: N/A  . Number of Children: 4  . Years of Education: 13   Occupational History  . retired    Social History Main Topics  . Smoking status: Never Smoker   . Smokeless tobacco: Never Used  . Alcohol Use: No  . Drug Use: No  . Sexual Activity:    Partners: Female   Other Topics Concern  . Not on file   Social History Narrative   Patient is right handed.   Patient does not drink caffeine.     Review of Systems General:  No chills, fever, night sweats or weight changes.  Cardiovascular:  No dyspnea on exertion, edema, orthopnea, palpitations, paroxysmal nocturnal dyspnea. +chest pain Dermatological: No rash, lesions/masses Respiratory: No cough +dyspnea Urologic: No hematuria, dysuria Abdominal:   No nausea, vomiting, diarrhea, bright red blood per rectum, melena, or hematemesis Neurologic:  No visual changes, wkns, changes in mental status. All other systems reviewed and are otherwise negative except as noted above.  Physical Exam  Blood pressure 119/65, pulse 58, temperature 97.8 F (36.6 C), temperature source Oral, resp. rate 20, SpO2 97 %.  General: Pleasant, NAD Psych: Normal affect. Neuro: Alert and oriented X 3. Moves all extremities spontaneously. HEENT: Normal  Neck: Supple without bruits or JVD. Lungs:  Resp regular and unlabored, CTA. Heart: RRR no s3, s4, or murmurs. Abdomen: Soft, non-tender, non-distended, BS + x 4.  Extremities: No clubbing, cyanosis or edema. DP/PT/Radials 2+ and equal bilaterally.  Labs  Troponin [facility] of Care Test)  Recent Labs  05/29/15 1128  TROPIPOC 0.00   No results for input(s): CKTOTAL, CKMB, TROPONINI in the last 72 hours. Lab Results  Component Value Date   WBC 8.0 05/29/2015   HGB 13.9 05/29/2015     HCT 39.0 05/29/2015   MCV  84.1 05/29/2015   PLT 149* 05/29/2015    Recent Labs Lab 05/29/15 1121  NA 135  K 4.3  CL 104  CO2 23  BUN 11  CREATININE 1.06  CALCIUM 9.0  GLUCOSE 313*   Lab Results  Component Value Date   CHOL 162 05/01/2015   HDL 33.90* 05/01/2015   LDLCALC 107* 05/01/2015   TRIG 107.0 05/01/2015   Lab Results  Component Value Date   DDIMER <0.22 08/01/2012     Radiology/Studies  Dg Chest Portable 1 View  05/29/2015   CLINICAL DATA:  Central chest pain, cough, and shortness of breath for 3 days.  EXAM: PORTABLE CHEST - 1 VIEW  COMPARISON:  01/02/2015  FINDINGS: Sequelae of prior CABG are again identified. Cardiac silhouette is unchanged and likely within normal limits in size for portable AP technique and degree of lung inflation. The patient has taken a shallower inspiration than on the prior study. Multiple external monitoring leads overlie the chest, partially obscuring the right greater than left lung bases. Allowing for this, no airspace consolidation, edema, pleural effusion, or pneumothorax is identified. No acute osseous abnormality is seen.  IMPRESSION: No active disease.   Electronically Signed   By: Logan Bores   On: 05/29/2015 11:26    ECG  EKG showed normal sinus rhythm with chronic RBBB and no acute changes.  Echocardiogram 07/31/2012  LV EF: 60% -  65%  ------------------------------------------------------------ Indications:   Chest pain 786.51.  ------------------------------------------------------------ History:  PMH:  Coronary artery disease. Risk factors: Hypothyroidism. Depression. Restless leg syndrome. Vertigo. RBBB. Sciatica. Bilateral leg pain. Dizziness. H/o colon adenoma. H/o nephrolithiasis. Fatigue. Insomnia. Anemia. OSA. Edema. Falling episodes. Palpitations. Hypertension. Diabetes mellitus. Dyslipidemia.  ------------------------------------------------------------ Study Conclusions  Left ventricle: The  cavity size was normal. Wall thickness was normal. Systolic function was normal. The estimated ejection fraction was in the range of 60% to 65%. Wall motion was normal; there were no regional wall motion abnormalities. Doppler parameters are consistent with abnormal left ventricular relaxation (grade 1 diastolic dysfunction).        ASSESSMENT AND PLAN  1. Chest pain occuring both at rest and with exertion  - 2 previous cath showed significant prox LAD dx with patent LIMA to LAD and SVG to OM, D1, and PLA  - pt very hesitant and states myoview did not work for him in the past. Will definitively assess with repeat cath tomorrow given small vessel dx.  -Risk and benefit of procedure explained to the patient who display clear understanding and agree to proceed. Discussed with patient possible procedural risk include bleeding, vascular injury, renal injury, arrythmia, MI, stroke and loss of limb or life.   2. Dizziness: check orthostatic vital sign and carotid U/S  3. CAD s/p CABG 2000 (LIMA to LAD, SVG to OM, SAVG to D1, SVG to PLA)  - patent grafts on cath 2013 and Jan 2016  4. OSA  5. chronic RBBB  6. HTN  7. HLD  8. DM: hold metformin, only partially compliant with insulin at home depend on his economic situation   Hilbert Corrigan, [place] 05/29/2015, 2:20 PM  Patient examined chart reviewed.  Ongoing SSCP with history of distant CABG and stents Myovoue non ishcemic inpast with subsequent CABG for 3VD.  Favor repeat cath to define anatomy.  Exam benign No acute ECG changes And troponin negative   DM A1c9.3  Issue is affording meds.  On meal coverage and LA insulin at night but does not Take it for  weeks due to lack of money.  Outpatient f/u Dr Randel Pigg to see if additional generic oral agents can be used Such as glimperide in lieu of financial issues and inconsistent use of LA nightly insulin.    Jenkins Rouge

## 2015-05-29 NOTE — ED Notes (Signed)
Per EMS- pt has had several episodes of intermittent CP for the past 2-3 days. Pain in center chest/left upper chest - states "it just hurts." No radiation. Some GI upset over last few days. Hx CABG, diabetes. Pt took 1 nitro and 324mg  aspirin prior to EMS arrival, pain 8/10 to 3/10. Pain increased to 5/10 en route, EMS gave 1 nitro, pain 0/10. 20G left hand. VSS- 120/65, 60bpm, 97% RA, CBG 280. RBBB on EKG - hx of RBBB.

## 2015-05-30 ENCOUNTER — Encounter (HOSPITAL_COMMUNITY)
Admission: EM | Disposition: A | Discharge status: Home / Self Care | Payer: Medicare PPO | Source: Home / Self Care | Attending: Emergency Medicine

## 2015-05-30 ENCOUNTER — Observation Stay (HOSPITAL_COMMUNITY): Payer: Medicare PPO

## 2015-05-30 ENCOUNTER — Encounter (HOSPITAL_COMMUNITY): Payer: Self-pay | Admitting: Cardiology

## 2015-05-30 DIAGNOSIS — I200 Unstable angina: Secondary | ICD-10-CM

## 2015-05-30 DIAGNOSIS — R42 Dizziness and giddiness: Secondary | ICD-10-CM

## 2015-05-30 DIAGNOSIS — R079 Chest pain, unspecified: Secondary | ICD-10-CM

## 2015-05-30 DIAGNOSIS — I10 Essential (primary) hypertension: Secondary | ICD-10-CM | POA: Diagnosis not present

## 2015-05-30 DIAGNOSIS — T82857A Stenosis of cardiac prosthetic devices, implants and grafts, initial encounter: Secondary | ICD-10-CM | POA: Diagnosis not present

## 2015-05-30 DIAGNOSIS — E785 Hyperlipidemia, unspecified: Secondary | ICD-10-CM | POA: Diagnosis not present

## 2015-05-30 DIAGNOSIS — I25110 Atherosclerotic heart disease of native coronary artery with unstable angina pectoris: Secondary | ICD-10-CM | POA: Diagnosis not present

## 2015-05-30 DIAGNOSIS — I2510 Atherosclerotic heart disease of native coronary artery without angina pectoris: Secondary | ICD-10-CM

## 2015-05-30 HISTORY — PX: CARDIAC CATHETERIZATION: SHX172

## 2015-05-30 LAB — GLUCOSE, CAPILLARY
Glucose-Capillary: 141 mg/dL — ABNORMAL HIGH (ref 65–99)
Glucose-Capillary: 155 mg/dL — ABNORMAL HIGH (ref 65–99)
Glucose-Capillary: 164 mg/dL — ABNORMAL HIGH (ref 65–99)
Glucose-Capillary: 231 mg/dL — ABNORMAL HIGH (ref 65–99)
Glucose-Capillary: 374 mg/dL — ABNORMAL HIGH (ref 65–99)

## 2015-05-30 LAB — BASIC METABOLIC PANEL
Anion gap: 10 (ref 5–15)
BUN: 12 mg/dL (ref 6–20)
CHLORIDE: 105 mmol/L (ref 101–111)
CO2: 23 mmol/L (ref 22–32)
Calcium: 9.2 mg/dL (ref 8.9–10.3)
Creatinine, Ser: 0.88 mg/dL (ref 0.61–1.24)
GFR calc Af Amer: >60 mL/min (ref >60)
GFR calc non Af Amer: >60 mL/min (ref >60)
Glucose, Bld: 162 mg/dL — ABNORMAL HIGH (ref 65–99)
POTASSIUM: 3.5 mmol/L (ref 3.5–5.1)
SODIUM: 138 mmol/L (ref 135–145)

## 2015-05-30 LAB — TROPONIN I: Troponin I: <0.03 ng/mL (ref <0.031)

## 2015-05-30 LAB — D-DIMER, QUANTITATIVE: D-Dimer, Quant: <0.27 ug/mL-FEU (ref 0.00–0.48)

## 2015-05-30 SURGERY — LEFT HEART CATH AND CORONARY ANGIOGRAPHY
Anesthesia: LOCAL

## 2015-05-30 MED ORDER — FENTANYL CITRATE (PF) 100 MCG/2ML IJ SOLN
INTRAMUSCULAR | Status: DC | PRN
  Administered 2015-05-30: 25 ug via INTRAVENOUS

## 2015-05-30 MED ORDER — HEPARIN SODIUM (PORCINE) 1000 UNIT/ML IJ SOLN
INTRAMUSCULAR | Status: DC | PRN
  Administered 2015-05-30: 4000 [IU] via INTRAVENOUS

## 2015-05-30 MED ORDER — SODIUM CHLORIDE 0.9 % IJ SOLN: 3.0000 mL | Freq: Two times a day (BID) | INTRAMUSCULAR | Status: DC

## 2015-05-30 MED ORDER — RANOLAZINE ER 500 MG PO TB12: 500.0000 mg | ORAL_TABLET | Freq: Two times a day (BID) | ORAL | Status: DC

## 2015-05-30 MED ORDER — SODIUM CHLORIDE 0.9 % IJ SOLN: 3.0000 mL | INTRAMUSCULAR | Status: DC | PRN

## 2015-05-30 MED ORDER — ATORVASTATIN CALCIUM 40 MG PO TABS: 40.0000 mg | ORAL_TABLET | Freq: Every day | ORAL | Status: DC

## 2015-05-30 MED ORDER — LIDOCAINE HCL (PF) 1 % IJ SOLN
INTRAMUSCULAR | Status: DC | PRN
  Administered 2015-05-30: 5 mL via INTRADERMAL

## 2015-05-30 MED ORDER — RANOLAZINE ER 500 MG PO TB12
500.0000 mg | ORAL_TABLET | Freq: Two times a day (BID) | ORAL | Status: DC
  Administered 2015-05-30: 500 mg via ORAL
  Filled 2015-05-30 (×2): qty 1

## 2015-05-30 MED ORDER — SODIUM CHLORIDE 0.9 % WEIGHT BASED INFUSION
3.0000 mL/kg/h | INTRAVENOUS | Status: AC
  Administered 2015-05-30: 3 mL/kg/h via INTRAVENOUS

## 2015-05-30 MED ORDER — VERAPAMIL HCL 2.5 MG/ML IV SOLN
INTRAVENOUS | Status: DC | PRN
  Administered 2015-05-30: 08:00:00 via INTRA_ARTERIAL

## 2015-05-30 MED ORDER — HEPARIN (PORCINE) IN NACL 2-0.9 UNIT/ML-% IJ SOLN
INTRAMUSCULAR | Status: AC
  Filled 2015-05-30: qty 1500

## 2015-05-30 MED ORDER — MIDAZOLAM HCL 2 MG/2ML IJ SOLN
INTRAMUSCULAR | Status: DC | PRN
  Administered 2015-05-30: 2 mg via INTRAVENOUS

## 2015-05-30 MED ORDER — ASPIRIN 81 MG PO TBEC: 81.0000 mg | DELAYED_RELEASE_TABLET | Freq: Every day | ORAL | Status: DC

## 2015-05-30 MED ORDER — FENTANYL CITRATE (PF) 100 MCG/2ML IJ SOLN
INTRAMUSCULAR | Status: AC
  Filled 2015-05-30: qty 2

## 2015-05-30 MED ORDER — NITROGLYCERIN 1 MG/10 ML FOR IR/CATH LAB
INTRA_ARTERIAL | Status: AC
  Filled 2015-05-30: qty 10

## 2015-05-30 MED ORDER — MIDAZOLAM HCL 2 MG/2ML IJ SOLN
INTRAMUSCULAR | Status: AC
  Filled 2015-05-30: qty 2

## 2015-05-30 MED ORDER — ATORVASTATIN CALCIUM 40 MG PO TABS
40.0000 mg | ORAL_TABLET | Freq: Every day | ORAL | Status: DC
  Administered 2015-05-30: 40 mg via ORAL
  Filled 2015-05-30 (×2): qty 1

## 2015-05-30 MED ORDER — HEPARIN SODIUM (PORCINE) 1000 UNIT/ML IJ SOLN
INTRAMUSCULAR | Status: AC
  Filled 2015-05-30: qty 1

## 2015-05-30 MED ORDER — IOHEXOL 350 MG/ML SOLN
INTRAVENOUS | Status: DC | PRN
  Administered 2015-05-30: 145 mL via INTRA_ARTERIAL

## 2015-05-30 MED ORDER — LIDOCAINE HCL (PF) 1 % IJ SOLN
INTRAMUSCULAR | Status: AC
Start: 2015-05-30 — End: 2015-05-30
  Filled 2015-05-30: qty 30

## 2015-05-30 MED ORDER — SODIUM CHLORIDE 0.9 % IV SOLN: 250.0000 mL | INTRAVENOUS | Status: DC | PRN

## 2015-05-30 MED ORDER — SODIUM CHLORIDE 0.9 % IV SOLN
INTRAVENOUS | Status: DC | PRN
  Administered 2015-05-30: 500 mL via INTRAVENOUS

## 2015-05-30 SURGICAL SUPPLY — 17 items
CATH INFINITI 5 FR IM (CATHETERS) ×1 IMPLANT
CATH INFINITI 5 FR JL3.5 (CATHETERS) ×1 IMPLANT
CATH INFINITI 5FR ANG PIGTAIL (CATHETERS) ×2 IMPLANT
CATH INFINITI 5FR JL4 (CATHETERS) ×1 IMPLANT
CATH INFINITI 5FR MULTPACK ANG (CATHETERS) IMPLANT
CATH OPTITORQUE TIG 4.0 5F (CATHETERS) ×2 IMPLANT
DEVICE RAD COMP TR BAND LRG (VASCULAR PRODUCTS) ×2 IMPLANT
GLIDESHEATH SLEND A-KIT 6F 22G (SHEATH) ×2 IMPLANT
KIT HEART LEFT (KITS) ×2 IMPLANT
PACK CARDIAC CATHETERIZATION (CUSTOM PROCEDURE TRAY) ×2 IMPLANT
SHEATH PINNACLE 5F 10CM (SHEATH) IMPLANT
SYR MEDRAD MARK V 150ML (SYRINGE) ×2 IMPLANT
TRANSDUCER W/STOPCOCK (MISCELLANEOUS) ×2 IMPLANT
TUBING CIL FLEX 10 FLL-RA (TUBING) ×2 IMPLANT
WIRE EMERALD 3MM-J .035X150CM (WIRE) IMPLANT
WIRE HI TORQ VERSACORE-J 145CM (WIRE) ×1 IMPLANT
WIRE SAFE-T 1.5MM-J .035X260CM (WIRE) ×2 IMPLANT

## 2015-05-30 NOTE — Discharge Summary (Signed)
Discharge Summary   Patient ID: [name] [name],  MRN: [id number], DOB/AGE: August 07, 1946 69 y.o.  Admit date: 05/29/2015 Discharge date: 05/30/2015  Primary Care Provider: Penni Homans Primary Cardiologist: Dr. [name]  Discharge Diagnoses Principal Problem:   Chest pain Active Problems:   Coronary artery disease involving native coronary artery   Hyperlipidemia with target LDL less than 70   Essential hypertension   Hx of CABG   Diabetes mellitus type II, uncontrolled   Presence of drug coated stent in right coronary artery   Allergies Allergies  Allergen Reactions  . Morphine     REACTION: hallucinations  . Shellfish-Derived Products Itching    Procedures  Carotid and vertebral U/S 05/30/2015  Bilateral carotid artery duplex completed: 1-39% ICA stenosis. Vertebral artery flow is antegrade.    Cardiac catheterization 05/30/2015 Conclusion    1. Prox LAD to Mid LAD lesion, 90% stenosed. Mid LAD to Dist LAD lesion, 100% stenosed within a bare metal stent (from Pre-CABG). 2. Ost 2nd Diag to 2nd Diag lesion, 100% stenosed within a BMS (pre-CABG) 3. Post Atrio lesion, 100% stenosed. 4. Widely patent DES stent to the mid RCA just prior to the bifurcation into RPDA and RPAV 5. Widely patent LIMA to a Small downstream distal LAD - minimal CAD 6. Widely patent saphenous vein grafts to the D1, D2 and RPAV 7. The left ventricular systolic function is normal. 8. No new lesion to explain unstable angina   Severe native disease of the LAD (occluded D1, as well as occluded stents in D2 and LAD.) as well as occluded RPAV. Patent RCA stents & Grafts x 4.  Relatively stable findings of angiography. There was sluggish flow down the grafts to the diagonal branches secondary to hypotension following radial cocktail administered.  Recommendations:  Standard post radial cath TR been removal  Anticipate discharge later on today with increased medical management.  In the future, I  think that we could consider noninvasive stress testing as he is unlikely to have a false negative stress test given his current anatomy.        HPI  The patient is a 68 year old Caucasian male with past medical history of OSA, chronic RBBB, HTN, HLD, DM, and CAD s/p CABG 2000 (LIMA to LAD, SVG to OM, SAVG to D1, SVG to PLA). He had multiple PCI since that time in 2006, 2009, 2013 and 2016. According to the patient, Myoview does does not work for him as he had negative Myoview 4 days prior to his original MI and being told that he needed bypass surgery. His last Myoview was in 2006 which was negative. He had stable cath with stable grafts in both 2013 and most recently January 2016. It was noted the patient does have significant proximal LAD disease which has been stable on 2 previous cardiac catheterizations. He also has patent LIMA to LAD. According to the patient, his anginal manifestation is left-sided sharp aching chest pain. Since his last cardiac catheterization in 2016, has been doing well. He also has been compliant with his medications, although does occasional miss his insulin.  Hospital Course  Patient presented to [facility] on 05/29/2015 with 3-4 days onset of chest pain. He had the worst episode of chest pain in the morning of arrival while making breakfast cereal for his grandson. Patient was concerned about the accuracy of Myoview as he had MI and CABG 4 days after his initial negative myoview in the past. After discussing various options, it was decided for him  to undergo cardiac catheterization to definitively assess coronary anatomy. Of note, on the day of arrival, during chest pain episode he had significant dizziness. Overnight his serial troponin was negative. Cardiac catheterization performed on 05/30/2015 showed severe native disease of LAD with occluded D1 as well as occluded stents in D2 and LAD, patent RCA stents, patent LIMA to LAD, patent SVG to D1, D2, RPAV. No new  lesion to explain his chest pain. It was recommended that in the future, we can attempt noninvasive stress testing as he is unlikely to have false negative stress test given his current anatomy. Carotid ultrasound was obtained on 6/24 which showed 1-39% ICA stenosis bilaterally, antegrade vertebral artery flow. Orthostatic vital sign was obtained on the same day which does show the patient is somewhat orthostatic especially comparing between sitting and standing position.  Given the persistent atypical chest pain, d-dimer was obtained which was negative. The patient is deemed stable for discharge from cardiology perspective. I have instructed the patient not to lift anything greater than 5 pounds for one week. If he has any recurrent chest pain in the future, a Myoview should be considered. The reason patient has been hesitant to obtain Myoview was he had a false negative Myoview in the past 4 days before heart attack which led to the bypass procedure. It is possible that the Myoview in the past was falsely negative due to balanced ischemia and his current anatomy will make falsely negative Myoview less likely. Prior to discharge, Renaxa 500mg  BID was added to his medical regimen. His diuretics was stopped due to hypotension during cath and lack of HF symptom in the past.. I have instructed the patient to hold metformin for 48 hours after cardiac catheterization, he can restarted on 06/02/2015. I have sent staff message to our scheduler to arrange outpatient followup in 2-4 weeks with Dr. Kae Heller office.   Orthostatic vital sign Lying: BP 107/53, pulse 73. Sitting BP 117/69, pulse 87. Standing BP 93/58, pulse 68.   Discharge Vitals Blood pressure 117/65, pulse 55, temperature 97.6 F (36.4 C), temperature source Oral, resp. rate 28, height 5\' 9"  (1.753 m), weight 166 lb 9.6 oz (75.569 kg), SpO2 100 %.  Filed Weights   05/29/15 1631 05/30/15 0455  Weight: 168 lb 1.6 oz (76.25 kg) 166 lb 9.6 oz (75.569 kg)     Labs  CBC  Recent Labs  05/29/15 1121  WBC 8.0  HGB 13.9  HCT 39.0  MCV 84.1  PLT 123456*   Basic Metabolic Panel  Recent Labs  05/29/15 1121 05/29/15 1727 05/30/15 0447  NA 135  --  138  K 4.3  --  3.5  CL 104  --  105  CO2 23  --  23  GLUCOSE 313*  --  162*  BUN 11  --  12  CREATININE 1.06 1.10 0.88  CALCIUM 9.0  --  9.2   Cardiac Enzymes  Recent Labs  05/29/15 1727 05/29/15 2242 05/30/15 0447  TROPONINI <0.03 <0.03 <0.03   D-Dimer  Recent Labs  05/30/15 1530  DDIMER <0.27    Disposition  Pt is being discharged home today in good condition.  Follow-up Plans & Appointments      Follow-up Information    Follow up with Penni Homans, MD On 05/30/2015.   Specialty:  Family Medicine   Why:  Followup with your primary care physician regarding uncontrolled diabetes   Contact information:   [address] [place] [address] [phone]  Follow up with Dola Argyle, MD.   Specialty:  Cardiology   Why:  Office will contact you to arrange followup, please give [place] a call if you do not hear from [place] in 2 business days   Contact information:   [address] [place] [address] [phone]       Discharge Medications    Medication List    STOP taking these medications        furosemide 20 MG tablet  Commonly known as:  LASIX     simvastatin 40 MG tablet  Commonly known as:  ZOCOR      TAKE these medications        aspirin 81 MG EC tablet  Take 1 tablet (81 mg total) by mouth daily.     atorvastatin 40 MG tablet  Commonly known as:  LIPITOR  Take 1 tablet (40 mg total) by mouth daily at 6 PM.     clotrimazole-betamethasone cream  Commonly known as:  LOTRISONE  APPLY TO THE AFFECTED TWICE DAILY     diclofenac sodium 1 % Gel  Commonly known as:  VOLTAREN  Apply 2 g topically 3 (three) times daily as needed. For pain to affected area     gabapentin 300 MG capsule  Commonly known as:   NEURONTIN  Take 300 mg by mouth 3 (three) times daily.     hyoscyamine 0.125 MG tablet  Commonly known as:  LEVSIN, ANASPAZ  Take 1 tablet (0.125 mg total) by mouth every 8 (eight) hours as needed for cramping.     insulin aspart 100 UNIT/ML injection  Commonly known as:  novoLOG  Use 10-17 unit 3 times daily before meals     Insulin Glargine 300 UNIT/ML Sopn  Commonly known as:  TOUJEO SOLOSTAR  Inject 42 Units into the skin at bedtime.     isosorbide mononitrate 120 MG 24 hr tablet  Commonly known as:  IMDUR  Take 1 tablet (120 mg total) by mouth daily.     levothyroxine 75 MCG tablet  Commonly known as:  SYNTHROID, LEVOTHROID  Take 1 tablet (75 mcg total) by mouth daily.     metFORMIN 1000 MG tablet  Commonly known as:  GLUCOPHAGE  Take 1 tablet (1,000 mg total) by mouth 2 (two) times daily with a meal. Please resume on 01/05/15     nitroGLYCERIN 0.4 MG SL tablet  Commonly known as:  NITROSTAT  Place 1 tablet (0.4 mg total) under the tongue every 5 (five) minutes as needed (up to 3 doses).     pantoprazole 40 MG tablet  Commonly known as:  PROTONIX  Take twice daily for 2 weeks to see if it helps the symptoms.     pramipexole 0.25 MG tablet  Commonly known as:  MIRAPEX  Take 1 tablet (0.25 mg total) by mouth at bedtime.     ranolazine 500 MG 12 hr tablet  Commonly known as:  RANEXA  Take 1 tablet (500 mg total) by mouth 2 (two) times daily.     sertraline 100 MG tablet  Commonly known as:  ZOLOFT  Take 1 tablet (100 mg total) by mouth 2 (two) times daily.     topiramate 50 MG tablet  Commonly known as:  TOPAMAX  Take 1 tablet (50 mg total) by mouth at bedtime.        Duration of Discharge Encounter   Greater than 30 minutes including physician time.  Signed, Almyra Deforest PA-C Pager: [phone]  05/30/2015, 5:26 PM

## 2015-05-30 NOTE — Progress Notes (Signed)
At 0900 pt return from cath with Lt radial arm band D&I.  Encourage to keep arm elevated and will withdraw air q 15 minutes as ordered.  Call bell at bedside and instructed to call as needed.  Verbalized understanding.  Will continue to monitor.  Karie Kirks, Therapist, sports.

## 2015-05-30 NOTE — Interval H&P Note (Signed)
History and Physical Interval Note:  05/30/2015 7:25 AM  [name] [name]  has presented today for surgery, with the diagnosis of cp - Unstable Angina.    The various methods of treatment have been discussed with the patient and family. Patient insisted upon invasive evaluation with catheterization as opposed to nuclear stress testing. He ruled out for MI  After consideration of risks, benefits and other options for treatment, the patient has consented to  Procedure(s): Left Heart Cath and Coronary Angiography (N/A) With Possible Percutaneous Coronary Intervention as a surgical intervention .  The patient's history has been reviewed, patient examined, no change in status, stable for surgery.  I have reviewed the patient's chart and labs.  Questions were answered to the patient's satisfaction.    Risks / Complications include, but not limited to: Death, MI, CVA/TIA, VF/VT (with defibrillation), Bradycardia (need for temporary pacer placement), contrast induced nephropathy, bleeding / bruising / hematoma / pseudoaneurysm, vascular or coronary injury (with possible emergent CT or Vascular Surgery), adverse medication reactions, infection.  Additional risks involving the use of radiation with the possibility of radiation burns and cancer were explained in detail.  The patient voices understanding and agree to proceed.     [place], [place]   Cath Lab Visit (complete for each Cath Lab visit)  Clinical Evaluation Leading to the Procedure:   ACS: Yes.    Non-ACS:    Anginal Classification: CCS IV  Anti-ischemic medical therapy: Minimal Therapy (1 class of medications)  Non-Invasive Test Results: No non-invasive testing performed  Prior CABG: Previous CABG   TIMI SCORE  Patient Information:  TIMI Score is 4  UA/NSTEMI and intermediate-risk features (e.g., TIMI score 3?4) for short-term risk of death or nonfatal MI  Revascularization of the presumed culprit artery   A (8)  Indication:  10; Score: 8

## 2015-05-30 NOTE — Progress Notes (Signed)
Bilateral carotid artery duplex completed:  1-39% ICA stenosis.  Vertebral artery flow is antegrade.     

## 2015-05-30 NOTE — Progress Notes (Signed)
Pt back from carotid duplex and notified Randall Hiss in lab that stat D.Dimer is ordered.  Instructed that he will send someone.  Karie Kirks, Therapist, sports.

## 2015-05-30 NOTE — Progress Notes (Signed)
The patient's CBG was 473. The on-call cardiologist was notified and new orders were given to administer 15 units of Novolog insulin to the patient along with the patient's Lantus.  His CBG upon recheck was 374.

## 2015-05-30 NOTE — Progress Notes (Addendum)
SUBJECTIVE:  Very minimal CP OBJECTIVE:   Vitals:   Filed Vitals:   05/30/15 0831 05/30/15 0835 05/30/15 0840 05/30/15 0845  BP: 98/58 104/59 110/57 117/65  Pulse: 56 59 56 55  Temp:      TempSrc:      Resp: 17 11 18 28   Height:      Weight:      SpO2: 96% 97% 96% 100%   I&O's:   Intake/Output Summary (Last 24 hours) at 05/30/15 1354 Last data filed at 05/30/15 1316  Gross per 24 hour  Intake   1560 ml  Output    450 ml  Net   1110 ml   TELEMETRY: Reviewed telemetry pt in NSR:     PHYSICAL EXAM General: Well developed, well nourished, in no acute distress Head: Eyes PERRLA, No xanthomas.   Normal cephalic and atramatic  Lungs:   Clear bilaterally to auscultation and percussion. Heart:   HRRR S1 S2 Pulses are 2+ & equal. Abdomen: Bowel sounds are positive, abdomen soft and non-tender without masses  Extremities:   No clubbing, cyanosis or edema.  DP +1 Neuro: Alert and oriented X 3. Psych:  Good affect, responds appropriately   LABS: Basic Metabolic Panel:  Recent Labs  05/29/15 1121 05/29/15 1727 05/30/15 0447  NA 135  --  138  K 4.3  --  3.5  CL 104  --  105  CO2 23  --  23  GLUCOSE 313*  --  162*  BUN 11  --  12  CREATININE 1.06 1.10 0.88  CALCIUM 9.0  --  9.2   Liver Function Tests: No results for input(s): AST, ALT, ALKPHOS, BILITOT, PROT, ALBUMIN in the last 72 hours. No results for input(s): LIPASE, AMYLASE in the last 72 hours. CBC:  Recent Labs  05/29/15 1121  WBC 8.0  HGB 13.9  HCT 39.0  MCV 84.1  PLT 149*   Cardiac Enzymes:  Recent Labs  05/29/15 1727 05/29/15 2242 05/30/15 0447  TROPONINI <0.03 <0.03 <0.03   BNP: Invalid input(s): POCBNP D-Dimer: No results for input(s): DDIMER in the last 72 hours. Hemoglobin A1C: No results for input(s): HGBA1C in the last 72 hours. Fasting Lipid Panel: No results for input(s): CHOL, HDL, LDLCALC, TRIG, CHOLHDL, LDLDIRECT in the last 72 hours. Thyroid Function Tests: No results  for input(s): TSH, T4TOTAL, T3FREE, THYROIDAB in the last 72 hours.  Invalid input(s): FREET3 Anemia Panel: No results for input(s): VITAMINB12, FOLATE, FERRITIN, TIBC, IRON, RETICCTPCT in the last 72 hours. Coag Panel:   Lab Results  Component Value Date   INR 1.15 05/29/2015   INR 1.11 01/03/2015   INR 0.98 07/31/2012    RADIOLOGY: Dg Chest Portable 1 View  05/29/2015   CLINICAL DATA:  Central chest pain, cough, and shortness of breath for 3 days.  EXAM: PORTABLE CHEST - 1 VIEW  COMPARISON:  01/02/2015  FINDINGS: Sequelae of prior CABG are again identified. Cardiac silhouette is unchanged and likely within normal limits in size for portable AP technique and degree of lung inflation. The patient has taken a shallower inspiration than on the prior study. Multiple external monitoring leads overlie the chest, partially obscuring the right greater than left lung bases. Allowing for this, no airspace consolidation, edema, pleural effusion, or pneumothorax is identified. No acute osseous abnormality is seen.  IMPRESSION: No active disease.   Electronically Signed   By: Logan Bores   On: 05/29/2015 11:26   ASSESSMENT AND PLAN  1. Chest pain  occuring both at rest and with exertion - 2 previous cath showed significant prox LAD dx with patent LIMA to LAD and SVG to OM, D1, and PLA  - Cath today showed Prox LAD to Mid LAD lesion, 90% stenosed. Mid LAD to Dist LAD lesion, 100% stenosed within a bare metal stent (from Pre-CABG), Ost 2nd Diag to 2nd Diag lesion, 100% stenosed within a BMS (pre-CABG), Post Atrio lesion, 100% stenosed, widely patent DES stent to the mid RCA just prior to the bifurcation into RPDA and RPA, Widely patent LIMA to a Small downstream distal LAD - minimal CAD, Widely patent saphenous vein grafts to the D1, D2 and RPAV.  The left ventricular systolic function is normal.  No new lesion to explain unstable angina.    - continue ASA/long acting nitrate/statin  - cannot use  BB due to bradycardia or dihydropyridine due to soft BP  - add Ranexa 500mg  BID  - check D-Dimer given sharpness of pain  2. Dizziness: hypotensive during cath which has resolved. Going down for carotid dopplers.  Will check orthostatics when he gets back.  Will stop diuretics.  No history of CHF in the past.  3. CAD s/p CABG 2000 (LIMA to LAD, SVG to OM, SAVG to D1, SVG to PLA) - patent grafts on cath 2013 and Jan 2016 and today    4. OSA  5. chronic RBBB  6. HTN - controlled  7. HLD - will have to change simvastatin to Lipitor due to adding ranexa.  Will actually do lipitor 40mg  daily since his LDL was not at goal a month ago (107).  Will need repeat FLP and ALT in 6 weeks  8. DM: hold metformin, only partially compliant with insulin at home depend on his economic situation.  Restart metformin in 48 hours.  Needs early followup with PCP for elevated HbA1C.    If d-dimer normal and orthostatic BP ok then d/c home later today.   [name] Margarita, MD  05/30/2015  1:54 PM

## 2015-05-30 NOTE — Progress Notes (Signed)
Pt's post cath vs unable to print from the BP machine as the machine had wiped out the readings.  Cause unknown.  New machine used and Vs obtained and recorded.  Notified [place], [place] when he came up to see pt for d/c.  Instructed it was ok and he saw orthostatic VS obtained.  Karie Kirks, Therapist, sports.

## 2015-05-30 NOTE — Progress Notes (Signed)
At [place] d/c instructions explained and given to pt.  Verbalized understanding.  D/c off floor by NT to awaiting transport.  Karie Kirks, RN

## 2015-05-30 NOTE — Progress Notes (Signed)
Inpatient Diabetes Program Recommendations  AACE/ADA: New Consensus Statement on Inpatient Glycemic Control (2013)  Target Ranges:  Prepandial:   less than 140 mg/dL      Peak postprandial:   less than 180 mg/dL (1-2 hours)      Critically ill patients:  140 - 180 mg/dL   Spoke with patient regarding his elevated HgbA1C of 9.3%. Pt takes Toujeo 42 units at HS at home and novolog SSI. However, pt states he can't always afford his insulins as his co-pays are expensive and he and his wife are on multiple meds. I emphasized need to control glucose with diet and exercise in hopes to lower the insulin doses he is requiring presently. Pt has PCP who possibly could give him sample pens when needed (?). Pt does not qualify for the insulin savings card nor financial assistance. His medicare coverage disqualifies pt to use the free savings card for Toujeo or any insulins. Even the novolin and humulin insulins are not always inexpensive as well.   Thank you Rosita Kea, RN, MSN, CDE  Diabetes Inpatient Program Office: [phone] Pager: [phone] 8:00 am to 5:00 pm

## 2015-05-31 LAB — HEMOGLOBIN A1C
Hgb A1c MFr Bld: 10.7 % — ABNORMAL HIGH (ref 4.8–5.6)
MEAN PLASMA GLUCOSE: 260 mg/dL

## 2015-06-02 MED FILL — Nitroglycerin IV Soln 100 MCG/ML in D5W: INTRA_ARTERIAL | Qty: 10 | Status: AC

## 2015-06-02 MED FILL — Lidocaine HCl Local Preservative Free (PF) Inj 1%: INTRAMUSCULAR | Qty: 30 | Status: AC

## 2015-06-02 MED FILL — Heparin Sodium (Porcine) 2 Unit/ML in Sodium Chloride 0.9%: INTRAMUSCULAR | Qty: 1500 | Status: AC

## 2015-06-04 ENCOUNTER — Encounter: Payer: Self-pay | Admitting: Cardiovascular Disease

## 2015-06-04 ENCOUNTER — Encounter: Payer: Self-pay | Admitting: Cardiology

## 2015-06-11 ENCOUNTER — Telehealth: Payer: Self-pay

## 2015-06-11 NOTE — Telephone Encounter (Signed)
Admit date: 05/29/2015 Discharge date: 05/30/2015  Reason for admission: Chest Pain, Dizziness  Pt states he feels good.  Symptoms are much improved.  Denies chest pain, but states he continues to experience some dizziness with "moving around" and not with initial standing.  He has been rising slowly as instructed and drinking enough fluids.  No needs voiced at this time.    Hospital follow up appointment scheduled for tomorrow (06/12/15) at 2:30pm with Dr. Charlett Blake.

## 2015-06-12 ENCOUNTER — Ambulatory Visit (INDEPENDENT_AMBULATORY_CARE_PROVIDER_SITE_OTHER): Payer: Medicare PPO | Admitting: Family Medicine

## 2015-06-12 ENCOUNTER — Encounter: Payer: Self-pay | Admitting: Family Medicine

## 2015-06-12 VITALS — BP 128/62 | HR 68 | Temp 98.3°F | Ht 69.0 in | Wt 176.1 lb

## 2015-06-12 DIAGNOSIS — E785 Hyperlipidemia, unspecified: Secondary | ICD-10-CM

## 2015-06-12 DIAGNOSIS — E1165 Type 2 diabetes mellitus with hyperglycemia: Secondary | ICD-10-CM

## 2015-06-12 DIAGNOSIS — R079 Chest pain, unspecified: Secondary | ICD-10-CM

## 2015-06-12 DIAGNOSIS — S060X0S Concussion without loss of consciousness, sequela: Secondary | ICD-10-CM

## 2015-06-12 DIAGNOSIS — K219 Gastro-esophageal reflux disease without esophagitis: Secondary | ICD-10-CM | POA: Diagnosis not present

## 2015-06-12 DIAGNOSIS — IMO0002 Reserved for concepts with insufficient information to code with codable children: Secondary | ICD-10-CM

## 2015-06-12 DIAGNOSIS — I10 Essential (primary) hypertension: Secondary | ICD-10-CM | POA: Diagnosis not present

## 2015-06-12 MED ORDER — METFORMIN HCL 1000 MG PO TABS: 1000.0000 mg | ORAL_TABLET | Freq: Two times a day (BID) | ORAL | Status: DC

## 2015-06-12 MED ORDER — PANTOPRAZOLE SODIUM 40 MG PO TBEC
DELAYED_RELEASE_TABLET | ORAL | Status: DC
Start: 2015-06-12 — End: 2015-08-04

## 2015-06-12 MED ORDER — GABAPENTIN 300 MG PO CAPS: 300.0000 mg | ORAL_CAPSULE | Freq: Three times a day (TID) | ORAL | Status: DC

## 2015-06-12 NOTE — Progress Notes (Signed)
Pre visit review using our clinic review tool, if applicable. No additional management support is needed unless otherwise documented below in the visit note. 

## 2015-06-12 NOTE — Patient Instructions (Signed)

## 2015-06-13 ENCOUNTER — Telehealth: Payer: Self-pay | Admitting: Cardiology

## 2015-06-13 NOTE — Telephone Encounter (Signed)
Follow Up  Pt states that he is calling nurse christine back, Please assist

## 2015-06-13 NOTE — Telephone Encounter (Signed)
Follow Up ° °Pt returned call//  °

## 2015-06-13 NOTE — Telephone Encounter (Signed)
**Note De-Identified  Obfuscation** LMTCB. I did not call the pt but Ive left a message asking him to call me back so I can help him figure out who did call him.

## 2015-06-13 NOTE — Telephone Encounter (Signed)
**Note De-Identified  Obfuscation** The pt states that it must have been another office that called him.

## 2015-06-13 NOTE — Telephone Encounter (Signed)
PT AWARE OF LAB RESULTS./CY 

## 2015-06-19 ENCOUNTER — Emergency Department (HOSPITAL_COMMUNITY)
Admission: EM | Admit: 2015-06-19 | Discharge: 2015-06-19 | Disposition: A | Discharge status: Home / Self Care | Payer: Medicare PPO | Attending: Emergency Medicine | Admitting: Emergency Medicine

## 2015-06-19 ENCOUNTER — Emergency Department (HOSPITAL_COMMUNITY): Payer: Medicare PPO

## 2015-06-19 ENCOUNTER — Encounter (HOSPITAL_COMMUNITY): Payer: Self-pay | Admitting: Neurology

## 2015-06-19 DIAGNOSIS — Z9119 Patient's noncompliance with other medical treatment and regimen: Secondary | ICD-10-CM | POA: Insufficient documentation

## 2015-06-19 DIAGNOSIS — E079 Disorder of thyroid, unspecified: Secondary | ICD-10-CM | POA: Diagnosis not present

## 2015-06-19 DIAGNOSIS — Z794 Long term (current) use of insulin: Secondary | ICD-10-CM | POA: Diagnosis not present

## 2015-06-19 DIAGNOSIS — Z87442 Personal history of urinary calculi: Secondary | ICD-10-CM | POA: Diagnosis not present

## 2015-06-19 DIAGNOSIS — G2581 Restless legs syndrome: Secondary | ICD-10-CM | POA: Diagnosis not present

## 2015-06-19 DIAGNOSIS — E119 Type 2 diabetes mellitus without complications: Secondary | ICD-10-CM | POA: Insufficient documentation

## 2015-06-19 DIAGNOSIS — E785 Hyperlipidemia, unspecified: Secondary | ICD-10-CM | POA: Insufficient documentation

## 2015-06-19 DIAGNOSIS — Z9889 Other specified postprocedural states: Secondary | ICD-10-CM | POA: Insufficient documentation

## 2015-06-19 DIAGNOSIS — Z862 Personal history of diseases of the blood and blood-forming organs and certain disorders involving the immune mechanism: Secondary | ICD-10-CM | POA: Insufficient documentation

## 2015-06-19 DIAGNOSIS — Z7982 Long term (current) use of aspirin: Secondary | ICD-10-CM | POA: Diagnosis not present

## 2015-06-19 DIAGNOSIS — I2510 Atherosclerotic heart disease of native coronary artery without angina pectoris: Secondary | ICD-10-CM | POA: Insufficient documentation

## 2015-06-19 DIAGNOSIS — F319 Bipolar disorder, unspecified: Secondary | ICD-10-CM | POA: Insufficient documentation

## 2015-06-19 DIAGNOSIS — I10 Essential (primary) hypertension: Secondary | ICD-10-CM | POA: Insufficient documentation

## 2015-06-19 DIAGNOSIS — R079 Chest pain, unspecified: Secondary | ICD-10-CM | POA: Diagnosis not present

## 2015-06-19 DIAGNOSIS — Z79899 Other long term (current) drug therapy: Secondary | ICD-10-CM | POA: Insufficient documentation

## 2015-06-19 DIAGNOSIS — Z8739 Personal history of other diseases of the musculoskeletal system and connective tissue: Secondary | ICD-10-CM | POA: Diagnosis not present

## 2015-06-19 DIAGNOSIS — R109 Unspecified abdominal pain: Secondary | ICD-10-CM | POA: Diagnosis not present

## 2015-06-19 DIAGNOSIS — Z951 Presence of aortocoronary bypass graft: Secondary | ICD-10-CM | POA: Diagnosis not present

## 2015-06-19 NOTE — ED Notes (Signed)
Pt reports cp since this morning, got worse this afternoon has taken 3 nitro with reduction in pain. 3/10, severe cramp in left upper chest, reports sob and nauseated with diaphoresis. Hx of cagb

## 2015-06-19 NOTE — ED Notes (Signed)
Called lab stated test results completed however the results are not crossing over to EPIC. Will tube results via tube system.

## 2015-06-19 NOTE — ED Provider Notes (Signed)
CSN: [id number]     Arrival date & time 06/19/15  1824 History   First MD Initiated Contact with Patient 06/19/15 2024     Chief Complaint  Patient presents with  . Chest Pain     (Consider location/radiation/quality/duration/timing/severity/associated sxs/prior Treatment) Patient is a 69 y.o. male presenting with chest pain.  Chest Pain Pain location:  Substernal area and L chest Pain quality: dull and sharp   Pain radiates to:  L arm and R arm (stomach) Pain radiates to the back: yes   Pain severity:  Moderate Onset quality:  Gradual Duration:  2 days Timing:  Constant Progression:  Waxing and waning Chronicity:  Recurrent Context: at rest   Relieved by:  Nothing Worsened by:  Nothing tried Associated symptoms: abdominal pain   Associated symptoms: no anxiety     Past Medical History  Diagnosis Date  . Diabetes mellitus type II, uncontrolled   . OSA (obstructive sleep apnea)   . Hyperlipidemia   . RBBB (right bundle branch block)   . Depression     Bipolar  . Edema   . Hypertension   . Falling episodes     these have occurred in the past and again recurring 2011  . Spondylosis     C5-6, C6-7 MRI 2010  . CAD (coronary artery disease)     A. CABG in 2000,status post cardiac cath in 2006, 2009 ....continued chest pain and SOB despite oral medication adjestments including Ranexa. B. Cath November 2009/ mRCA - 2.75 x 23 Abbott Xience V drug-eluting stent ...11/26/2008 to distal  RCA leading to acute marginal.  C. Cath 07/2012 for CP - stable anatomy, med rx. d. cath 2015 and 05/30/2015 stable anatomy, consider Myoview if has CP again  . Hx of CABG     2000,  / one median sternotomy suture broken her chest x-ray November, 2010, no clinical significance  . Palpitations     event recorder showed sinus rhythm  . Nephrolithiasis   . Restless leg   . Dizziness   . Shortness of breath     CPX April, 2011, mild functional limitation, no clear pulmonary or cardiac limitation,  possible deconditioning and mild chronotropic incompetence( peak heart rate 130)  . Cerebral ischemia     MRI November, 2010, chronic microvascular ischemia  . Anemia     hemoglobin 7.4, iron deficiency, January, 2011, 2 unit transfusion, endoscopy normal, capsule endoscopy February, 2011 no small bowel abnormalities.   Most likely source gastric erosions, followed by GI  . H/O medication noncompliance     Due to loss of insurance  . Ejection fraction     EF 60%, echo, July 31, 2012  . Diabetes mellitus without complication   . Pain in limb 06/12/2009    Qualifier: Diagnosis of  By: [name]   . Low back pain 06/12/2009    Qualifier: Diagnosis of  By: [name]   . Thyroid disease   . Right knee pain 01/07/2015  . Advance care planning 05/01/2015  . ACP (advance care planning) 05/11/2015   Past Surgical History  Procedure Laterality Date  . Nasal septum surgery      UP3  . Coronary artery bypass graft      2000  . Left heart catheterization with coronary/graft angiogram N/A 08/01/2012    Procedure: LEFT HEART CATHETERIZATION WITH Beatrix Fetters;  Surgeon: [name], MD;  Location: [facility] CATH LAB;  Service: Cardiovascular;  Laterality: N/A;  . Left  heart catheterization with coronary/graft angiogram N/A 01/03/2015    Procedure: LEFT HEART CATHETERIZATION WITH Beatrix Fetters;  Surgeon: [name], MD;  Location: [facility] CATH LAB;  Service: Cardiovascular;  Laterality: N/A;  . Percutaneous coronary stent intervention (pci-s)  10/2008    mRCA PCI  2.75 x 23 Abbott Xience V drug-eluting stent   . Cardiac catheterization N/A 05/30/2015    Procedure: Left Heart Cath and Coronary Angiography;  Surgeon: [name], MD;  Location: [facility] CV LAB;  Service: Cardiovascular;  Laterality: N/A;   Family History  Problem Relation Age of Onset  . Cancer Brother     pancreatic cancer  . Diabetes Brother     4   . Stroke Brother   . Diabetes Brother   .  Cancer Brother   . Diabetes Brother   . Coronary artery disease Brother   . Diabetes Brother   . Hypothyroidism Brother   . Heart attack      Nephew  . Diabetes Mother   . Heart failure Mother   . Heart failure Father   . Irregular heart beat Daughter   . Cancer Maternal Grandmother     unknown    History  Substance Use Topics  . Smoking status: Never Smoker   . Smokeless tobacco: Never Used  . Alcohol Use: No    Review of Systems  Cardiovascular: Positive for chest pain.  Gastrointestinal: Positive for abdominal pain.  All other systems reviewed and are negative.     Allergies  Morphine and Shellfish-derived products  Home Medications   Prior to Admission medications   Medication Sig Start Date End Date Taking? Authorizing Provider  aspirin EC 325 MG tablet Take 325 mg by mouth daily.   Yes Historical Provider, MD  atorvastatin (LIPITOR) 40 MG tablet Take 1 tablet (40 mg total) by mouth daily at 6 PM. 05/30/15  Yes Almyra Deforest, PA  clotrimazole-betamethasone (LOTRISONE) cream APPLY TO THE AFFECTED TWICE DAILY 05/01/15  Yes [name], MD  diclofenac sodium (VOLTAREN) 1 % GEL Apply 2 g topically 3 (three) times daily as needed. For pain to affected area 05/01/15  Yes [name], MD  gabapentin (NEURONTIN) 300 MG capsule Take 1 capsule (300 mg total) by mouth 3 (three) times daily. 06/12/15  Yes [name], MD  insulin aspart (NOVOLOG) 100 UNIT/ML injection Use 10-17 unit 3 times daily before meals Patient taking differently: Inject 10-17 Units into the skin 3 (three) times daily with meals. Use 10-17 unit 3 times daily before meals 12/12/14  Yes Philemon Kingdom, MD  Insulin Glargine (TOUJEO SOLOSTAR) 300 UNIT/ML SOPN Inject 42 Units into the skin at bedtime. 02/20/15  Yes [name], MD  isosorbide mononitrate (IMDUR) 120 MG 24 hr tablet Take 1 tablet (120 mg total) by mouth daily. 05/08/13  Yes Doe-Hyun Kyra Searles, DO  levothyroxine (SYNTHROID, LEVOTHROID) 75 MCG tablet  Take 1 tablet (75 mcg total) by mouth daily. 05/01/15  Yes [name], MD  metFORMIN (GLUCOPHAGE) 1000 MG tablet Take 1 tablet (1,000 mg total) by mouth 2 (two) times daily with a meal. 06/12/15  Yes [name], MD  nitroGLYCERIN (NITROSTAT) 0.4 MG SL tablet Place 1 tablet (0.4 mg total) under the tongue every 5 (five) minutes as needed (up to 3 doses). 05/08/13  Yes Doe-Hyun R Shawna Orleans, DO  pantoprazole (PROTONIX) 40 MG tablet Take twice daily for 2 weeks then drop to once daily 06/12/15  Yes [name], MD  pramipexole (MIRAPEX) 0.25 MG tablet Take 1 tablet (0.25 mg total) by mouth at bedtime. 05/08/13  Yes Doe-Hyun Kyra Searles, DO  ranolazine (RANEXA) 500 MG 12 hr tablet Take 1 tablet (500 mg total) by mouth 2 (two) times daily. 05/30/15  Yes Almyra Deforest, PA  sertraline (ZOLOFT) 100 MG tablet Take 1 tablet (100 mg total) by mouth 2 (two) times daily. 05/07/13  Yes Doe-Hyun R Shawna Orleans, DO  topiramate (TOPAMAX) 50 MG tablet Take 1 tablet (50 mg total) by mouth at bedtime. 04/25/15  Yes Adam R Jaffe, DO   BP 129/59 mmHg  Pulse 58  Temp(Src) 98.4 F (36.9 C) (Oral)  Resp 20  SpO2 95% Physical Exam  Constitutional: He is oriented to person, place, and time. He appears well-developed and well-nourished.  HENT:  Head: Normocephalic and atraumatic.  Eyes: Conjunctivae and EOM are normal.  Neck: Normal range of motion. Neck supple.  Cardiovascular: Normal rate, regular rhythm and normal heart sounds.   Pulmonary/Chest: Effort normal and breath sounds normal. No respiratory distress.  Abdominal: He exhibits no distension. There is no tenderness. There is no rebound and no guarding.  Musculoskeletal: Normal range of motion.  Neurological: He is alert and oriented to person, place, and time.  Skin: Skin is warm and dry.  Vitals reviewed.   ED Course  Procedures (including critical care time) Labs Review Labs Reviewed  BASIC METABOLIC PANEL  CBC  BASIC METABOLIC PANEL  CBC  I-STAT [place], ED    Imaging  Review Dg Chest 2 View  06/19/2015   CLINICAL DATA:  Chest pain for 3 days  EXAM: CHEST - 2 VIEW  COMPARISON:  05/29/2015  FINDINGS: Cardiac shadow is stable. Postoperative changes are again seen. The lungs are well aerated bilaterally without focal infiltrate or sizable effusion. No acute bony abnormality is noted.  IMPRESSION: No active disease.   Electronically Signed   By: Inez Catalina [name]   On: 06/19/2015 19:10     EKG Interpretation   Date/Time:  Thursday June 19 2015 18:35:39 EDT Ventricular Rate:  82 PR Interval:  164 QRS Duration: 124 QT Interval:  414 QTC Calculation: 483 R Axis:   -15 Text Interpretation:  Normal sinus rhythm Right bundle branch block  Abnormal ECG No significant change since last tracing Confirmed by Debby Freiberg [phone]) on 06/19/2015 8:53:56 PM      MDM   Final diagnoses:  Chest pain, unspecified chest pain type    69 y.o. male with pertinent PMH of CAD sp CABG with negative cath 1 month ago without change presents with recurrent chest pain, both at rest and with exertion.  He states it is mildly worse than his baseline so presented today for evaluation. He denies fever, nausea, vomiting, other concerning findings. On some of exam the patient is pain-free states that after he took nitroglycerin his pain had been relieved prior to arrival. Exam benign. Workup with negative delta troponin. Suspect likely stable angina. Encourage patient to follow-up with cardiology. Discharged home in stable condition with standard return precautions..    I have reviewed all laboratory and imaging studies if ordered as above  1. Chest pain, unspecified chest pain type         Debby Freiberg, MD 06/20/15 [phone]

## 2015-06-19 NOTE — ED Notes (Signed)
Called mini lab for i-Stat troponin test negative.

## 2015-06-19 NOTE — ED Notes (Signed)
MD at bedside. 

## 2015-06-19 NOTE — ED Notes (Signed)
Lab results not crossing over. i-STAT troponin showed to MD.

## 2015-06-19 NOTE — Discharge Instructions (Signed)

## 2015-06-20 LAB — CBC
HCT: 40.6 % (ref 39.0–52.0)
Hemoglobin: 14.2 g/dL (ref 13.0–17.0)
MCH: 30.2 pg (ref 26.0–34.0)
MCHC: 35 g/dL (ref 30.0–36.0)
MCV: 86.4 fL (ref 78.0–100.0)
PLATELETS: 164 10*3/uL (ref 150–400)
RBC: 4.7 MIL/uL (ref 4.22–5.81)
RDW: 14 % (ref 11.5–15.5)
WBC: 8.2 10*3/uL (ref 4.0–10.5)

## 2015-06-20 LAB — BASIC METABOLIC PANEL
Anion gap: 11 (ref 5–15)
BUN: 14 mg/dL (ref 6–20)
CO2: 22 mmol/L (ref 22–32)
CREATININE: 1.26 mg/dL — AB (ref 0.61–1.24)
Calcium: 9.3 mg/dL (ref 8.9–10.3)
Chloride: 107 mmol/L (ref 101–111)
GFR calc Af Amer: >60 mL/min (ref >60)
GFR calc non Af Amer: 58 mL/min — ABNORMAL LOW (ref >60)
Glucose, Bld: 185 mg/dL — ABNORMAL HIGH (ref 65–99)
POTASSIUM: 4.4 mmol/L (ref 3.5–5.1)
SODIUM: 140 mmol/L (ref 135–145)

## 2015-06-20 LAB — I-STAT TROPONIN, ED
Troponin i, poc: 0 ng/mL (ref 0.00–0.08)
Troponin i, poc: 0 ng/mL (ref 0.00–0.08)

## 2015-06-22 ENCOUNTER — Encounter: Payer: Self-pay | Admitting: Family Medicine

## 2015-06-22 NOTE — Assessment & Plan Note (Signed)
Post concussive Headache improving. Is established with neurology

## 2015-06-22 NOTE — Assessment & Plan Note (Signed)
Was just released from the hospital with chest pain, patient with complicated cardiac history, he is following closely with cardiology, no pain today. He is aware not to undergo any strenuous activity til follow up with cardiology and to keep his NTG with him. Seek immediate care if pain recurs and does not resolve

## 2015-06-22 NOTE — Assessment & Plan Note (Signed)
Well controlled, no changes to meds. Encouraged heart healthy diet such as the DASH diet and exercise as tolerated.  °

## 2015-06-22 NOTE — Progress Notes (Signed)
[name] [name]  [id number] 1946-07-30 06/22/2015      Progress Note-Follow Up  Subjective  Chief Complaint  Chief Complaint  Patient presents with  . Hospitalization Follow-up    HPI  Patient is a 69 y.o. male in today for routine medical care. Patient is in today for hospital follow-up. He was recently hospitalized with chest pain, shortness of breath and palpitations. He described the pain as sharp and radiating to his left arm. With aspirin and nitroglycerin the pain resolved but during hospitalization recurred. Patient with a complicated cardiac history that includes stents and bypasses. He has no symptoms here today. Has close follow-up with cardiology. His blood sugars continue to be labile. Patient is in today noting that his headaches are improving. Denies palp/congestion/fevers/GI or GU c/o. Taking meds as prescribed  Past Medical History  Diagnosis Date  . Diabetes mellitus type II, uncontrolled   . OSA (obstructive sleep apnea)   . Hyperlipidemia   . RBBB (right bundle branch block)   . Depression     Bipolar  . Edema   . Hypertension   . Falling episodes     these have occurred in the past and again recurring 2011  . Spondylosis     C5-6, C6-7 MRI 2010  . CAD (coronary artery disease)     A. CABG in 2000,status post cardiac cath in 2006, 2009 ....continued chest pain and SOB despite oral medication adjestments including Ranexa. B. Cath November 2009/ mRCA - 2.75 x 23 Abbott Xience V drug-eluting stent ...11/26/2008 to distal  RCA leading to acute marginal.  C. Cath 07/2012 for CP - stable anatomy, med rx. d. cath 2015 and 05/30/2015 stable anatomy, consider Myoview if has CP again  . Hx of CABG     2000,  / one median sternotomy suture broken her chest x-ray November, 2010, no clinical significance  . Palpitations     event recorder showed sinus rhythm  . Nephrolithiasis   . Restless leg   . Dizziness   . Shortness of breath     CPX April, 2011, mild  functional limitation, no clear pulmonary or cardiac limitation, possible deconditioning and mild chronotropic incompetence( peak heart rate 130)  . Cerebral ischemia     MRI November, 2010, chronic microvascular ischemia  . Anemia     hemoglobin 7.4, iron deficiency, January, 2011, 2 unit transfusion, endoscopy normal, capsule endoscopy February, 2011 no small bowel abnormalities.   Most likely source gastric erosions, followed by GI  . H/O medication noncompliance     Due to loss of insurance  . Ejection fraction     EF 60%, echo, July 31, 2012  . Diabetes mellitus without complication   . Pain in limb 06/12/2009    Qualifier: Diagnosis of  By: [name]   . Low back pain 06/12/2009    Qualifier: Diagnosis of  By: [name]   . Thyroid disease   . Right knee pain 01/07/2015  . Advance care planning 05/01/2015  . ACP (advance care planning) 05/11/2015    Past Surgical History  Procedure Laterality Date  . Nasal septum surgery      UP3  . Coronary artery bypass graft      2000  . Left heart catheterization with coronary/graft angiogram N/A 08/01/2012    Procedure: LEFT HEART CATHETERIZATION WITH Beatrix Fetters;  Surgeon: [name], MD;  Location: [facility] CATH LAB;  Service: Cardiovascular;  Laterality: N/A;  . Left heart catheterization  with coronary/graft angiogram N/A 01/03/2015    Procedure: LEFT HEART CATHETERIZATION WITH Beatrix Fetters;  Surgeon: [name], MD;  Location: [facility] CATH LAB;  Service: Cardiovascular;  Laterality: N/A;  . Percutaneous coronary stent intervention (pci-s)  10/2008    mRCA PCI  2.75 x 23 Abbott Xience V drug-eluting stent   . Cardiac catheterization N/A 05/30/2015    Procedure: Left Heart Cath and Coronary Angiography;  Surgeon: [name], MD;  Location: [place] CV LAB;  Service: Cardiovascular;  Laterality: N/A;    Family History  Problem Relation Age of Onset  . Cancer Brother     pancreatic cancer  .  Diabetes Brother     4   . Stroke Brother   . Diabetes Brother   . Cancer Brother   . Diabetes Brother   . Coronary artery disease Brother   . Diabetes Brother   . Hypothyroidism Brother   . Heart attack      Nephew  . Diabetes Mother   . Heart failure Mother   . Heart failure Father   . Irregular heart beat Daughter   . Cancer Maternal Grandmother     unknown     History   Social History  . Marital Status: Married    Spouse Name: N/A  . Number of Children: 4  . Years of Education: 13   Occupational History  . retired    Social History Main Topics  . Smoking status: Never Smoker   . Smokeless tobacco: Never Used  . Alcohol Use: No  . Drug Use: No  . Sexual Activity:    Partners: Female   Other Topics Concern  . Not on file   Social History Narrative   Patient is right handed.   Patient does not drink caffeine.    Current Outpatient Prescriptions on File Prior to Visit  Medication Sig Dispense Refill  . atorvastatin (LIPITOR) 40 MG tablet Take 1 tablet (40 mg total) by mouth daily at 6 PM. 30 tablet 5  . clotrimazole-betamethasone (LOTRISONE) cream APPLY TO THE AFFECTED TWICE DAILY 30 g 6  . diclofenac sodium (VOLTAREN) 1 % GEL Apply 2 g topically 3 (three) times daily as needed. For pain to affected area 2 Tube 3  . insulin aspart (NOVOLOG) 100 UNIT/ML injection Use 10-17 unit 3 times daily before meals (Patient taking differently: Inject 10-17 Units into the skin 3 (three) times daily with meals. Use 10-17 unit 3 times daily before meals) 15 mL 2  . Insulin Glargine (TOUJEO SOLOSTAR) 300 UNIT/ML SOPN Inject 42 Units into the skin at bedtime. 3 pen 2  . isosorbide mononitrate (IMDUR) 120 MG 24 hr tablet Take 1 tablet (120 mg total) by mouth daily. 90 tablet 1  . nitroGLYCERIN (NITROSTAT) 0.4 MG SL tablet Place 1 tablet (0.4 mg total) under the tongue every 5 (five) minutes as needed (up to 3 doses). 25 tablet 4  . ranolazine (RANEXA) 500 MG 12 hr tablet Take 1  tablet (500 mg total) by mouth 2 (two) times daily. 60 tablet 5  . sertraline (ZOLOFT) 100 MG tablet Take 1 tablet (100 mg total) by mouth 2 (two) times daily. 180 tablet 1  . levothyroxine (SYNTHROID, LEVOTHROID) 75 MCG tablet Take 1 tablet (75 mcg total) by mouth daily. 90 tablet 2  . pramipexole (MIRAPEX) 0.25 MG tablet Take 1 tablet (0.25 mg total) by mouth at bedtime. 90 tablet 1  . topiramate (TOPAMAX) 50 MG tablet Take 1 tablet (50 mg  total) by mouth at bedtime. 30 tablet 3   No current facility-administered medications on file prior to visit.    Allergies  Allergen Reactions  . Morphine     REACTION: hallucinations  . Shellfish-Derived Products Itching    Review of Systems  Review of Systems  Constitutional: Positive for malaise/fatigue. Negative for fever and chills.  HENT: Negative for congestion, hearing loss and nosebleeds.   Eyes: Negative for discharge.  Respiratory: Positive for shortness of breath. Negative for cough, sputum production and wheezing.   Cardiovascular: Positive for chest pain. Negative for palpitations and leg swelling.  Gastrointestinal: Negative for heartburn, nausea, vomiting, abdominal pain, diarrhea, constipation and blood in stool.  Genitourinary: Negative for dysuria, urgency, frequency and hematuria.  Musculoskeletal: Negative for myalgias, back pain and falls.  Skin: Negative for rash.  Neurological: Negative for dizziness, tremors, sensory change, focal weakness, loss of consciousness, weakness and headaches.  Endo/Heme/Allergies: Negative for polydipsia. Does not bruise/bleed easily.  Psychiatric/Behavioral: Negative for depression and suicidal ideas. The patient is not nervous/anxious and does not have insomnia.     Objective  BP 128/62 mmHg  Pulse 68  Temp(Src) 98.3 F (36.8 C) (Oral)  Ht 5\' 9"  (1.753 m)  Wt 176 lb 2 oz (79.89 kg)  BMI 26.00 kg/m2  SpO2 96%  Physical Exam  Physical Exam  Constitutional: He is oriented to  person, place, and time and well-developed, well-nourished, and in no distress. No distress.  HENT:  Head: Normocephalic and atraumatic.  Eyes: Conjunctivae are normal.  Neck: Neck supple. No thyromegaly present.  Cardiovascular: Normal rate, regular rhythm and normal heart sounds.   No murmur heard. Pulmonary/Chest: Effort normal and breath sounds normal. No respiratory distress.  Abdominal: He exhibits no distension and no mass. There is no tenderness.  Musculoskeletal: He exhibits no edema.  Neurological: He is alert and oriented to person, place, and time.  Skin: Skin is warm.  Psychiatric: Memory, affect and judgment normal.    Lab Results  Component Value Date   TSH 2.84 05/01/2015   Lab Results  Component Value Date   WBC 8.2 06/19/2015   HGB 14.2 06/19/2015   HCT 40.6 06/19/2015   MCV 86.4 06/19/2015   PLT 164 06/19/2015   Lab Results  Component Value Date   CREATININE 1.26* 06/19/2015   BUN 14 06/19/2015   NA 140 06/19/2015   K 4.4 06/19/2015   CL 107 06/19/2015   CO2 22 06/19/2015   Lab Results  Component Value Date   ALT 18 05/01/2015   AST 21 05/01/2015   ALKPHOS 93 05/01/2015   BILITOT 0.6 05/01/2015   Lab Results  Component Value Date   CHOL 162 05/01/2015   Lab Results  Component Value Date   HDL 33.90* 05/01/2015   Lab Results  Component Value Date   LDLCALC 107* 05/01/2015   Lab Results  Component Value Date   TRIG 107.0 05/01/2015   Lab Results  Component Value Date   CHOLHDL 5 05/01/2015     Assessment & Plan  Diabetes mellitus type II, uncontrolled Patient notes sugars continue to be labile despite medicaitons. Encouraged to increase insulin by 2 units per dose and to eat small frequent meals with lean proteins and complex carbs.Liana Gerold referral to endocrinology but declines for now.   Essential hypertension Well controlled, no changes to meds. Encouraged heart healthy diet such as the DASH diet and exercise as tolerated.    GERD Avoid offending foods, take probiotics. Do not eat  large meals in late evening and consider raising head of bed. Continue current meds  Hyperlipidemia with target LDL less than 70 Tolerating statin, encouraged heart healthy diet, avoid trans fats, minimize simple carbs and saturated fats. Increase exercise as tolerated  Concussion without loss of consciousness Post concussive Headache improving. Is established with neurology  Chest pain Was just released from the hospital with chest pain, patient with complicated cardiac history, he is following closely with cardiology, no pain today. He is aware not to undergo any strenuous activity til follow up with cardiology and to keep his NTG with him. Seek immediate care if pain recurs and does not resolve

## 2015-06-22 NOTE — Assessment & Plan Note (Signed)
Avoid offending foods, take probiotics. Do not eat large meals in late evening and consider raising head of bed. Continue current meds

## 2015-06-22 NOTE — Assessment & Plan Note (Addendum)
Patient notes sugars continue to be labile despite medicaitons. Encouraged to increase insulin by 2 units per dose and to eat small frequent meals with lean proteins and complex carbs.Liana Gerold referral to endocrinology but declines for now.

## 2015-06-22 NOTE — Assessment & Plan Note (Signed)
Tolerating statin, encouraged heart healthy diet, avoid trans fats, minimize simple carbs and saturated fats. Increase exercise as tolerated 

## 2015-07-08 ENCOUNTER — Ambulatory Visit: Payer: Medicare PPO | Admitting: Physician Assistant

## 2015-07-15 ENCOUNTER — Encounter: Payer: Self-pay | Admitting: Neurology

## 2015-07-15 ENCOUNTER — Ambulatory Visit (INDEPENDENT_AMBULATORY_CARE_PROVIDER_SITE_OTHER): Payer: Medicare PPO | Admitting: Neurology

## 2015-07-15 VITALS — BP 126/62 | HR 76 | Resp 20 | Ht 69.0 in | Wt 173.4 lb

## 2015-07-15 DIAGNOSIS — R51 Headache: Secondary | ICD-10-CM | POA: Diagnosis not present

## 2015-07-15 DIAGNOSIS — G4486 Cervicogenic headache: Secondary | ICD-10-CM | POA: Insufficient documentation

## 2015-07-15 DIAGNOSIS — M5481 Occipital neuralgia: Secondary | ICD-10-CM

## 2015-07-15 NOTE — Patient Instructions (Addendum)
1.  We will get MRI of the cervical spine without contrast to look for a structural cause for the headache. [facility] 07/23/15 at 2:45pm  2.  If it shows no explanation, then will have you come in for another occipital nerve block
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
## 2015-07-23 ENCOUNTER — Ambulatory Visit (HOSPITAL_COMMUNITY)
Admission: RE | Admit: 2015-07-23 | Discharge: 2015-07-23 | Disposition: A | Discharge status: Home / Self Care | Payer: Medicare PPO | Source: Ambulatory Visit | Attending: Neurology | Admitting: Neurology

## 2015-07-23 DIAGNOSIS — M5481 Occipital neuralgia: Secondary | ICD-10-CM

## 2015-07-23 DIAGNOSIS — R51 Headache: Secondary | ICD-10-CM

## 2015-07-23 DIAGNOSIS — M47892 Other spondylosis, cervical region: Secondary | ICD-10-CM | POA: Diagnosis not present

## 2015-07-23 DIAGNOSIS — M4802 Spinal stenosis, cervical region: Secondary | ICD-10-CM | POA: Insufficient documentation

## 2015-07-23 DIAGNOSIS — G4486 Cervicogenic headache: Secondary | ICD-10-CM

## 2015-07-24 ENCOUNTER — Ambulatory Visit (INDEPENDENT_AMBULATORY_CARE_PROVIDER_SITE_OTHER): Payer: Medicare PPO | Admitting: Family Medicine

## 2015-07-24 ENCOUNTER — Encounter: Payer: Self-pay | Admitting: Family Medicine

## 2015-07-24 VITALS — BP 108/62 | HR 79 | Temp 97.5°F | Ht 69.0 in | Wt 175.1 lb

## 2015-07-24 DIAGNOSIS — M545 Low back pain: Secondary | ICD-10-CM

## 2015-07-24 DIAGNOSIS — I10 Essential (primary) hypertension: Secondary | ICD-10-CM | POA: Diagnosis not present

## 2015-07-24 DIAGNOSIS — G44311 Acute post-traumatic headache, intractable: Secondary | ICD-10-CM

## 2015-07-24 DIAGNOSIS — E785 Hyperlipidemia, unspecified: Secondary | ICD-10-CM | POA: Diagnosis not present

## 2015-07-24 MED ORDER — SERTRALINE HCL 100 MG PO TABS: 100.0000 mg | ORAL_TABLET | Freq: Two times a day (BID) | ORAL | Status: DC

## 2015-07-24 NOTE — Progress Notes (Signed)
Pre visit review using our clinic review tool, if applicable. No additional management support is needed unless otherwise documented below in the visit note. 

## 2015-07-24 NOTE — Patient Instructions (Signed)
Back Pain, Adult Low back pain is very common. About 1 in 5 people have back pain.The cause of low back pain is rarely dangerous. The pain often gets better over time.About half of people with a sudden onset of back pain feel better in just 2 weeks. About 8 in 10 people feel better by 6 weeks.  CAUSES Some common causes of back pain include:  Strain of the muscles or ligaments supporting the spine.  Wear and tear (degeneration) of the spinal discs.  Arthritis.  Direct injury to the back. DIAGNOSIS Most of the time, the direct cause of low back pain is not known.However, back pain can be treated effectively even when the exact cause of the pain is unknown.Answering your caregiver's questions about your overall health and symptoms is one of the most accurate ways to make sure the cause of your pain is not dangerous. If your caregiver needs more information, he or she may order lab work or imaging tests (X-rays or MRIs).However, even if imaging tests show changes in your back, this usually does not require surgery. HOME CARE INSTRUCTIONS For many people, back pain returns.Since low back pain is rarely dangerous, it is often a condition that people can learn to manageon their own.   Remain active. It is stressful on the back to sit or stand in one place. Do not sit, drive, or stand in one place for more than 30 minutes at a time. Take short walks on level surfaces as soon as pain allows.Try to increase the length of time you walk each day.  Do not stay in bed.Resting more than 1 or 2 days can delay your recovery.  Do not avoid exercise or work.Your body is made to move.It is not dangerous to be active, even though your back may hurt.Your back will likely heal faster if you return to being active before your pain is gone.  Pay attention to your body when you bend and lift. Many people have less discomfortwhen lifting if they bend their knees, keep the load close to their bodies,and  avoid twisting. Often, the most comfortable positions are those that put less stress on your recovering back.  Find a comfortable position to sleep. Use a firm mattress and lie on your side with your knees slightly bent. If you lie on your back, put a pillow under your knees.  Only take over-the-counter or prescription medicines as directed by your caregiver. Over-the-counter medicines to reduce pain and inflammation are often the most helpful.Your caregiver may prescribe muscle relaxant drugs.These medicines help dull your pain so you can more quickly return to your normal activities and healthy exercise.  Put ice on the injured area.  Put ice in a plastic bag.  Place a towel between your skin and the bag.  Leave the ice on for 15-20 minutes, 03-04 times a day for the first 2 to 3 days. After that, ice and heat may be alternated to reduce pain and spasms.  Ask your caregiver about trying back exercises and gentle massage. This may be of some benefit.  Avoid feeling anxious or stressed.Stress increases muscle tension and can worsen back pain.It is important to recognize when you are anxious or stressed and learn ways to manage it.Exercise is a great option. SEEK MEDICAL CARE IF:  You have pain that is not relieved with rest or medicine.  You have pain that does not improve in 1 week.  You have new symptoms.  You are generally not feeling well. SEEK   IMMEDIATE MEDICAL CARE IF:   You have pain that radiates from your back into your legs.  You develop new bowel or bladder control problems.  You have unusual weakness or numbness in your arms or legs.  You develop nausea or vomiting.  You develop abdominal pain.  You feel faint. Document Released: 11/22/2005 Document Revised: 05/23/2012 Document Reviewed: 03/26/2014 [employer] Patient Information 2015 [employer]. This information is not intended to replace advice given to you by your health care provider. Make sure you  discuss any questions you have with your health care provider.  

## 2015-07-25 ENCOUNTER — Telehealth: Payer: Self-pay | Admitting: Neurology

## 2015-07-25 ENCOUNTER — Telehealth: Payer: Self-pay | Admitting: *Deleted

## 2015-07-25 DIAGNOSIS — M5090 Cervical disc disorder, unspecified, unspecified cervical region: Secondary | ICD-10-CM

## 2015-07-25 NOTE — Telephone Encounter (Signed)
Patient is aware of MRI  Results he will be waiting to hear from Dr Tamala Julian . He wishes to hold off on medication at this time

## 2015-07-25 NOTE — Telephone Encounter (Signed)
I left a message for patient to return my call regarding MRI . I have also sent a referral to Dr Tamala Julian

## 2015-07-25 NOTE — Telephone Encounter (Signed)
-----   Message from Pieter Partridge, DO sent at 07/25/2015 12:40 PM EDT ----- MRI shows disc bulges that could be contributing to the pain.  I would refer to Dr. Tamala Julian.  He has tried tizanidine in the past.  We can have him try cyclobenzaprine 10mg  every 8 hours as needed.  However he should be cautious of drowsiness (especially in regards to driving).  I would like to see him in 8 weeks.

## 2015-07-25 NOTE — Telephone Encounter (Signed)
Returning call/ [phone]

## 2015-07-30 ENCOUNTER — Ambulatory Visit (INDEPENDENT_AMBULATORY_CARE_PROVIDER_SITE_OTHER): Payer: Medicare PPO | Admitting: Family Medicine

## 2015-07-30 ENCOUNTER — Encounter: Payer: Self-pay | Admitting: Family Medicine

## 2015-07-30 VITALS — BP 122/70 | HR 68 | Ht 69.0 in | Wt 180.0 lb

## 2015-07-30 DIAGNOSIS — M4802 Spinal stenosis, cervical region: Secondary | ICD-10-CM | POA: Diagnosis not present

## 2015-07-30 MED ORDER — TIZANIDINE HCL 4 MG PO TABS: 4.0000 mg | ORAL_TABLET | Freq: Every evening | ORAL | Status: DC

## 2015-07-30 NOTE — Patient Instructions (Addendum)
Nice to meet you We will try an epidural(steroid injection) for your neck, specific to the level with issues  Add an extra  Gabapoentin nightly to help with nerve activity - 2 pills instead of 1  Adding the muscle relaxant to try at night as well  Consider adding the following -    Turmeric 500units 2x/day (can cause reflux)   Vitamin D 2000units/day  Come back 2 weeks after your epidural, schedule visit after you know date of epidural then we will get you doing exercises and maybe manipulation.

## 2015-07-30 NOTE — Progress Notes (Signed)
Pre visit review using our clinic review tool, if applicable. No additional management support is needed unless otherwise documented below in the visit note. 

## 2015-07-30 NOTE — Assessment & Plan Note (Signed)
I do believe the patient's cervical spinal stenosis could be contribute to some of the headache that he is having. I do think the patient is not having the nerve output and this is causing some muscle fatigue. Patient is having such significant amount of symptoms and does have some weakness on exam today that I do feel that a little more aggressive approach could be beneficial. We discussed many different options and patient has elected to try an epidural injection.  I think this would be better than the prednisone secondary to patient's complicated diabetes. Discussed with patient if any significant worsening weakness or numbness occurs that he should seek medical attention immediately. We discussed that when patient does return 2 weeks after the epidural we will consider possible osteopathic manipulation as well as try some home exercises. At this point I do not think he will tolerate. Increased patient's gabapentin to 600 mg at night and will continue the daily dosing. Patient also given a muscle relaxer.

## 2015-07-30 NOTE — Progress Notes (Signed)
[name] [address] [place], [place] [address] Phone: [phone] Subjective:    I'm seeing this patient by the request  of:  Jaffe MD.   CC: Headache and neck pain.   [id number] [name] [name] is a 69 y.o. male coming in with complaint of headache and neck pain. Patient has seen neurology. Patient is had this pain for greater than one month and may be going on 2 months at this time. Patient states seems to be all day can almost daily. Patient has had a past medical history significant for hypertension. Patient has tried many different medications with no significant improvement. Seem to be exacerbated with certain movements such as keeping his head to the left. States that most of it seems to be in the right suboccipital region but then travels across the head and onto the lateral aspect of the face sometimes. Patient thinks actually back in March while playing baseball as a catcher there is a possibility when he was hit in the left eye that could've started giving him difficulty. States that the next day he passed out. EMS was called and had a CT of the head as well as cervical spine that was fairly unremarkable except for C6-C7 degenerative disc disease. Since then come to have developed headache. Patient continued to have the headache and patient did have an MRI. MRI of the neck were reviewed by me. Patient does have moderate central canal's narrowing at C5-C6 and moderate foraminal stenosis at C6-C7 bilaterally. Patient states what is most concerning at this point is more the weakness he is having in his hands. States that it seems to be bilateral. More over the fifth and fourth finger she states. Patient states that throughout the day it seems to get worse and he is has to go to bed by about 2:00. An states when he wakes up in the morning he has significant stiffness in the hands and has to move them. States that after an hour he can do more activity but  then by 1 or 2 PM in the afternoon he has to go to bed again. Patient continues to have headaches as well. Patient states he only relief he gets at night is when he takes his wife's muscle relaxer. Rates the severity of the headache is 7 out of 10. Concerned because he is not made any improvement over the course last 6 months and may be worsening overall.  Past Medical History  Diagnosis Date  . Diabetes mellitus type II, uncontrolled   . OSA (obstructive sleep apnea)   . Hyperlipidemia   . RBBB (right bundle branch block)   . Depression     Bipolar  . Edema   . Hypertension   . Falling episodes     these have occurred in the past and again recurring 2011  . Spondylosis     C5-6, C6-7 MRI 2010  . CAD (coronary artery disease)     A. CABG in 2000,status post cardiac cath in 2006, 2009 ....continued chest pain and SOB despite oral medication adjestments including Ranexa. B. Cath November 2009/ mRCA - 2.75 x 23 Abbott Xience V drug-eluting stent ...11/26/2008 to distal  RCA leading to acute marginal.  C. Cath 07/2012 for CP - stable anatomy, med rx. d. cath 2015 and 05/30/2015 stable anatomy, consider Myoview if has CP again  . Hx of CABG     2000,  / one median sternotomy suture broken her chest x-ray November,  2010, no clinical significance  . Palpitations     event recorder showed sinus rhythm  . Nephrolithiasis   . Restless leg   . Dizziness   . Shortness of breath     CPX April, 2011, mild functional limitation, no clear pulmonary or cardiac limitation, possible deconditioning and mild chronotropic incompetence( peak heart rate 130)  . Cerebral ischemia     MRI November, 2010, chronic microvascular ischemia  . Anemia     hemoglobin 7.4, iron deficiency, January, 2011, 2 unit transfusion, endoscopy normal, capsule endoscopy February, 2011 no small bowel abnormalities.   Most likely source gastric erosions, followed by GI  . H/O medication noncompliance     Due to loss of insurance  .  Ejection fraction     EF 60%, echo, July 31, 2012  . Diabetes mellitus without complication   . Pain in limb 06/12/2009    Qualifier: Diagnosis of  By: [name]   . Low back pain 06/12/2009    Qualifier: Diagnosis of  By: [name]   . Thyroid disease   . Right knee pain 01/07/2015  . Advance care planning 05/01/2015  . ACP (advance care planning) 05/11/2015   Past Surgical History  Procedure Laterality Date  . Nasal septum surgery      UP3  . Coronary artery bypass graft      2000  . Left heart catheterization with coronary/graft angiogram N/A 08/01/2012    Procedure: LEFT HEART CATHETERIZATION WITH Beatrix Fetters;  Surgeon: [name], MD;  Location: [facility] CATH LAB;  Service: Cardiovascular;  Laterality: N/A;  . Left heart catheterization with coronary/graft angiogram N/A 01/03/2015    Procedure: LEFT HEART CATHETERIZATION WITH Beatrix Fetters;  Surgeon: [name], MD;  Location: [facility] CATH LAB;  Service: Cardiovascular;  Laterality: N/A;  . Percutaneous coronary stent intervention (pci-s)  10/2008    mRCA PCI  2.75 x 23 Abbott Xience V drug-eluting stent   . Cardiac catheterization N/A 05/30/2015    Procedure: Left Heart Cath and Coronary Angiography;  Surgeon: [name], MD;  Location: [facility] CV LAB;  Service: Cardiovascular;  Laterality: N/A;   Social History  Substance Use Topics  . Smoking status: Never Smoker   . Smokeless tobacco: Never Used  . Alcohol Use: No   Allergies  Allergen Reactions  . Morphine     REACTION: hallucinations  . Shellfish-Derived Products Itching   Family History  Problem Relation Age of Onset  . Cancer Brother     pancreatic cancer  . Diabetes Brother     4   . Stroke Brother   . Diabetes Brother   . Cancer Brother   . Diabetes Brother   . Coronary artery disease Brother   . Diabetes Brother   . Hypothyroidism Brother   . Heart attack      Nephew  . Diabetes Mother   . Heart failure  Mother   . Heart failure Father   . Irregular heart beat Daughter   . Cancer Maternal Grandmother     unknown        Past medical history, social, surgical and family history all reviewed in electronic medical record.   Review of Systems: No headache, visual changes, nausea, vomiting, diarrhea, constipation, dizziness, abdominal pain, skin rash, fevers, chills, night sweats, weight loss, swollen lymph nodes, body aches, joint swelling, muscle aches, chest pain, shortness of breath, mood changes.   Objective Blood pressure 122/70, pulse 68, height  5\' 9"  (1.753 m), weight 180 lb (81.647 kg), SpO2 97 %.  General: No apparent distress alert and oriented x3 mood and affect normal, dressed appropriately.  HEENT: Pupils equal, extraocular movements intact  Respiratory: Patient's speak in full sentences and does not appear short of breath  Cardiovascular: No lower extremity edema, non tender, no erythema  Skin: Warm dry intact with no signs of infection or rash on extremities or on axial skeleton.  Abdomen: Soft nontender  Neuro: Cranial nerves II through XII are intact, neurovascularly intact in all extremities with 2+ DTRs and 2+ pulses.  Lymph: No lymphadenopathy of posterior or anterior cervical chain or axillae bilaterally.  Gait normal with good balance and coordination.  MSK:  Non tender with full range of motion and good stability and symmetric strength and tone of shoulders, elbows, wrist, hip, knee and ankles bilaterally.  Neck: Inspection mild loss of lordosis noted. No palpable stepoffs. positiveSpurling's maneuver bilateral C7 nerve root. Range of motion lacks the last 2 of flexion last 10 of extension. Patient also lacks proximate 510 of rotation and side bending bilaterally right greater than left. Grip strength is decreased to 4 out of 5 but symmetric and sensation normal in bilateral hands Strength seems to be one week in the C7-T1 vicinity bilaterally No sensory change  to C4 to T1 Negative Hoffman sign bilaterally Reflexes normal    Impression and Recommendations:     This case required medical decision making of moderate complexity.

## 2015-08-01 ENCOUNTER — Encounter: Payer: Self-pay | Admitting: Cardiology

## 2015-08-01 DIAGNOSIS — I779 Disorder of arteries and arterioles, unspecified: Secondary | ICD-10-CM

## 2015-08-01 DIAGNOSIS — I739 Peripheral vascular disease, unspecified: Secondary | ICD-10-CM

## 2015-08-01 HISTORY — DX: Disorder of arteries and arterioles, unspecified: I77.9

## 2015-08-03 ENCOUNTER — Encounter: Payer: Self-pay | Admitting: Family Medicine

## 2015-08-03 NOTE — Assessment & Plan Note (Signed)
Now following with neurology, doing better but HA persist

## 2015-08-03 NOTE — Assessment & Plan Note (Signed)
Well controlled, no changes to meds. Encouraged heart healthy diet such as the DASH diet and exercise as tolerated.  °

## 2015-08-03 NOTE — Progress Notes (Signed)
Subjective:    Patient ID: [name] [name], male    DOB: 1946-11-25, 69 y.o.   MRN: [id number]  Chief Complaint  Patient presents with  . Follow-up    HPI Patient is in today for follow up on numerous concerns. No new concerns but continues to struggle with low back pain and ongoing headaches. The headaches are slowly improving. Is also noting some paresthesias and fatigue. Denies polyuria or polydipsia but is trying to eat better. Decreased carbs. Denies CP/palp/SOB/congestion/fevers/GI or GU c/o. Taking meds as prescribed  Past Medical History  Diagnosis Date  . Diabetes mellitus type II, uncontrolled   . OSA (obstructive sleep apnea)   . Hyperlipidemia   . RBBB (right bundle branch block)   . Depression     Bipolar  . Edema   . Hypertension   . Falling episodes     these have occurred in the past and again recurring 2011  . Spondylosis     C5-6, C6-7 MRI 2010  . CAD (coronary artery disease)     A. CABG in 2000,status post cardiac cath in 2006, 2009 ....continued chest pain and SOB despite oral medication adjestments including Ranexa. B. Cath November 2009/ mRCA - 2.75 x 23 Abbott Xience V drug-eluting stent ...11/26/2008 to distal  RCA leading to acute marginal.  C. Cath 07/2012 for CP - stable anatomy, med rx. d. cath 2015 and 05/30/2015 stable anatomy, consider Myoview if has CP again  . Hx of CABG     2000,  / one median sternotomy suture broken her chest x-ray November, 2010, no clinical significance  . Palpitations     event recorder showed sinus rhythm  . Nephrolithiasis   . Restless leg   . Dizziness   . Shortness of breath     CPX April, 2011, mild functional limitation, no clear pulmonary or cardiac limitation, possible deconditioning and mild chronotropic incompetence( peak heart rate 130)  . Cerebral ischemia     MRI November, 2010, chronic microvascular ischemia  . Anemia     hemoglobin 7.4, iron deficiency, January, 2011, 2 unit transfusion, endoscopy  normal, capsule endoscopy February, 2011 no small bowel abnormalities.   Most likely source gastric erosions, followed by GI  . H/O medication noncompliance     Due to loss of insurance  . Ejection fraction     EF 60%, echo, July 31, 2012  . Diabetes mellitus without complication   . Pain in limb 06/12/2009    Qualifier: Diagnosis of  By: [name]   . Low back pain 06/12/2009    Qualifier: Diagnosis of  By: [name]   . Thyroid disease   . Right knee pain 01/07/2015  . Advance care planning 05/01/2015  . ACP (advance care planning) 05/11/2015    Past Surgical History  Procedure Laterality Date  . Nasal septum surgery      UP3  . Coronary artery bypass graft      2000  . Left heart catheterization with coronary/graft angiogram N/A 08/01/2012    Procedure: LEFT HEART CATHETERIZATION WITH Beatrix Fetters;  Surgeon: [name], MD;  Location: [facility] CATH LAB;  Service: Cardiovascular;  Laterality: N/A;  . Left heart catheterization with coronary/graft angiogram N/A 01/03/2015    Procedure: LEFT HEART CATHETERIZATION WITH Beatrix Fetters;  Surgeon: [name], MD;  Location: [facility] CATH LAB;  Service: Cardiovascular;  Laterality: N/A;  . Percutaneous coronary stent intervention (pci-s)  10/2008    mRCA PCI  2.75 x 23 Abbott Xience V drug-eluting stent   . Cardiac catheterization N/A 05/30/2015    Procedure: Left Heart Cath and Coronary Angiography;  Surgeon: [name], MD;  Location: [place] CV LAB;  Service: Cardiovascular;  Laterality: N/A;    Family History  Problem Relation Age of Onset  . Cancer Brother     pancreatic cancer  . Diabetes Brother     4   . Stroke Brother   . Diabetes Brother   . Cancer Brother   . Diabetes Brother   . Coronary artery disease Brother   . Diabetes Brother   . Hypothyroidism Brother   . Heart attack      Nephew  . Diabetes Mother   . Heart failure Mother   . Heart failure Father   . Irregular heart  beat Daughter   . Cancer Maternal Grandmother     unknown     Social History   Social History  . Marital Status: Married    Spouse Name: N/A  . Number of Children: 4  . Years of Education: 13   Occupational History  . retired    Social History Main Topics  . Smoking status: Never Smoker   . Smokeless tobacco: Never Used  . Alcohol Use: No  . Drug Use: No  . Sexual Activity:    Partners: Female   Other Topics Concern  . Not on file   Social History Narrative   Patient is right handed.   Patient does not drink caffeine.    Outpatient Prescriptions Prior to Visit  Medication Sig Dispense Refill  . aspirin EC 325 MG tablet Take 325 mg by mouth daily.    Marland Kitchen atorvastatin (LIPITOR) 40 MG tablet Take 1 tablet (40 mg total) by mouth daily at 6 PM. 30 tablet 5  . BAYER CONTOUR NEXT TEST test strip     . clotrimazole-betamethasone (LOTRISONE) cream APPLY TO THE AFFECTED TWICE DAILY 30 g 6  . diclofenac sodium (VOLTAREN) 1 % GEL Apply 2 g topically 3 (three) times daily as needed. For pain to affected area 2 Tube 3  . gabapentin (NEURONTIN) 300 MG capsule Take 1 capsule (300 mg total) by mouth 3 (three) times daily. 90 capsule 5  . insulin aspart (NOVOLOG) 100 UNIT/ML injection Use 10-17 unit 3 times daily before meals (Patient taking differently: Inject 10-17 Units into the skin 3 (three) times daily with meals. Use 10-17 unit 3 times daily before meals) 15 mL 2  . Insulin Glargine (TOUJEO SOLOSTAR) 300 UNIT/ML SOPN Inject 42 Units into the skin at bedtime. 3 pen 2  . isosorbide mononitrate (IMDUR) 120 MG 24 hr tablet Take 1 tablet (120 mg total) by mouth daily. 90 tablet 1  . levothyroxine (SYNTHROID, LEVOTHROID) 75 MCG tablet Take 1 tablet (75 mcg total) by mouth daily. 90 tablet 2  . metFORMIN (GLUCOPHAGE) 1000 MG tablet Take 1 tablet (1,000 mg total) by mouth 2 (two) times daily with a meal. 60 tablet 5  . nitroGLYCERIN (NITROSTAT) 0.4 MG SL tablet Place 1 tablet (0.4 mg total)  under the tongue every 5 (five) minutes as needed (up to 3 doses). 25 tablet 4  . pantoprazole (PROTONIX) 40 MG tablet Take twice daily for 2 weeks then drop to once daily 45 tablet 5  . pramipexole (MIRAPEX) 0.25 MG tablet Take 1 tablet (0.25 mg total) by mouth at bedtime. 90 tablet 1  . ranolazine (RANEXA) 500 MG 12 hr tablet Take 1 tablet (500  mg total) by mouth 2 (two) times daily. 60 tablet 5  . topiramate (TOPAMAX) 50 MG tablet Take 1 tablet (50 mg total) by mouth at bedtime. 30 tablet 3  . sertraline (ZOLOFT) 100 MG tablet Take 1 tablet (100 mg total) by mouth 2 (two) times daily. 180 tablet 1   No facility-administered medications prior to visit.    Allergies  Allergen Reactions  . Morphine     REACTION: hallucinations  . Shellfish-Derived Products Itching    Review of Systems  Constitutional: Negative for fever and malaise/fatigue.  HENT: Negative for congestion.   Eyes: Negative for discharge.  Respiratory: Negative for shortness of breath.   Cardiovascular: Negative for chest pain, palpitations and leg swelling.  Gastrointestinal: Negative for nausea and abdominal pain.  Genitourinary: Negative for dysuria.  Musculoskeletal: Positive for back pain. Negative for falls.  Skin: Negative for rash.  Neurological: Positive for tingling and headaches. Negative for loss of consciousness.  Endo/Heme/Allergies: Negative for environmental allergies.  Psychiatric/Behavioral: Negative for depression. The patient is not nervous/anxious.        Objective:    Physical Exam  Constitutional: He is oriented to person, place, and time. He appears well-developed and well-nourished. No distress.  HENT:  Head: Normocephalic and atraumatic.  Nose: Nose normal.  Eyes: Right eye exhibits no discharge. Left eye exhibits no discharge.  Neck: Normal range of motion. Neck supple.  Cardiovascular: Normal rate and regular rhythm.   No murmur heard. Pulmonary/Chest: Effort normal and breath  sounds normal.  Abdominal: Soft. Bowel sounds are normal. There is no tenderness.  Musculoskeletal: He exhibits no edema.  Neurological: He is alert and oriented to person, place, and time.  Skin: Skin is warm and dry.  Psychiatric: He has a normal mood and affect.  Nursing note and vitals reviewed.   BP 108/62 mmHg  Pulse 79  Temp(Src) 97.5 F (36.4 C) (Oral)  Ht 5\' 9"  (1.753 m)  Wt 175 lb 2 oz (79.436 kg)  BMI 25.85 kg/m2  SpO2 97% Wt Readings from Last 3 Encounters:  07/30/15 180 lb (81.647 kg)  07/24/15 175 lb 2 oz (79.436 kg)  07/15/15 173 lb 6.4 oz (78.654 kg)     Lab Results  Component Value Date   WBC 8.2 06/19/2015   HGB 14.2 06/19/2015   HCT 40.6 06/19/2015   PLT 164 06/19/2015   GLUCOSE 185* 06/19/2015   CHOL 162 05/01/2015   TRIG 107.0 05/01/2015   HDL 33.90* 05/01/2015   LDLDIRECT 46.5 09/21/2012   LDLCALC 107* 05/01/2015   ALT 18 05/01/2015   AST 21 05/01/2015   NA 140 06/19/2015   K 4.4 06/19/2015   CL 107 06/19/2015   CREATININE 1.26* 06/19/2015   BUN 14 06/19/2015   CO2 22 06/19/2015   TSH 2.84 05/01/2015   PSA 2.39 11/25/2010   INR 1.15 05/29/2015   HGBA1C 10.7* 05/29/2015   MICROALBUR 1.1 05/01/2015    Lab Results  Component Value Date   TSH 2.84 05/01/2015   Lab Results  Component Value Date   WBC 8.2 06/19/2015   HGB 14.2 06/19/2015   HCT 40.6 06/19/2015   MCV 86.4 06/19/2015   PLT 164 06/19/2015   Lab Results  Component Value Date   NA 140 06/19/2015   K 4.4 06/19/2015   CO2 22 06/19/2015   GLUCOSE 185* 06/19/2015   BUN 14 06/19/2015   CREATININE 1.26* 06/19/2015   BILITOT 0.6 05/01/2015   ALKPHOS 93 05/01/2015   AST 21 05/01/2015  ALT 18 05/01/2015   PROT 7.7 05/01/2015   ALBUMIN 4.7 05/01/2015   CALCIUM 9.3 06/19/2015   ANIONGAP 11 06/19/2015   GFR 84.14 05/01/2015   Lab Results  Component Value Date   CHOL 162 05/01/2015   Lab Results  Component Value Date   HDL 33.90* 05/01/2015   Lab Results    Component Value Date   LDLCALC 107* 05/01/2015   Lab Results  Component Value Date   TRIG 107.0 05/01/2015   Lab Results  Component Value Date   CHOLHDL 5 05/01/2015   Lab Results  Component Value Date   HGBA1C 10.7* 05/29/2015       Assessment & Plan:   Problem List Items Addressed This Visit    Post-traumatic headache - Primary    Now following with neurology, doing better but HA persist      Relevant Medications   sertraline (ZOLOFT) 100 MG tablet   Low back pain    Encouraged moist heat and gentle stretching as tolerated. May try NSAIDs and prescription meds as directed and report if symptoms worsen or seek immediate care. Referred to sports med for further treatment      Hyperlipidemia with target LDL less than 70 (Chronic)    Tolerating statin, encouraged heart healthy diet, avoid trans fats, minimize simple carbs and saturated fats. Increase exercise as tolerated      Essential hypertension (Chronic)    Well controlled, no changes to meds. Encouraged heart healthy diet such as the DASH diet and exercise as tolerated.          I am having Mr. Tidd maintain his isosorbide mononitrate, pramipexole, nitroGLYCERIN, insulin aspart, Insulin Glargine, topiramate, clotrimazole-betamethasone, diclofenac sodium, levothyroxine, atorvastatin, ranolazine, pantoprazole, gabapentin, metFORMIN, aspirin EC, BAYER CONTOUR NEXT TEST, and sertraline.  Meds ordered this encounter  Medications  . sertraline (ZOLOFT) 100 MG tablet    Sig: Take 1 tablet (100 mg total) by mouth 2 (two) times daily.    Dispense:  180 tablet    Refill:  1     Penni Homans, MD

## 2015-08-03 NOTE — Assessment & Plan Note (Signed)
Encouraged moist heat and gentle stretching as tolerated. May try NSAIDs and prescription meds as directed and report if symptoms worsen or seek immediate care. Referred to sports med for further treatment

## 2015-08-03 NOTE — Assessment & Plan Note (Signed)
Tolerating statin, encouraged heart healthy diet, avoid trans fats, minimize simple carbs and saturated fats. Increase exercise as tolerated 

## 2015-08-04 ENCOUNTER — Ambulatory Visit: Payer: Medicare PPO | Admitting: Family Medicine

## 2015-08-04 ENCOUNTER — Telehealth: Payer: Self-pay | Admitting: Family Medicine

## 2015-08-04 ENCOUNTER — Ambulatory Visit (INDEPENDENT_AMBULATORY_CARE_PROVIDER_SITE_OTHER): Payer: Medicare PPO | Admitting: Cardiology

## 2015-08-04 ENCOUNTER — Encounter: Payer: Self-pay | Admitting: Cardiology

## 2015-08-04 VITALS — BP 138/72 | HR 64 | Ht 69.0 in | Wt 178.0 lb

## 2015-08-04 DIAGNOSIS — R51 Headache: Secondary | ICD-10-CM

## 2015-08-04 DIAGNOSIS — G4486 Cervicogenic headache: Secondary | ICD-10-CM

## 2015-08-04 DIAGNOSIS — I25118 Atherosclerotic heart disease of native coronary artery with other forms of angina pectoris: Secondary | ICD-10-CM | POA: Diagnosis not present

## 2015-08-04 DIAGNOSIS — M542 Cervicalgia: Secondary | ICD-10-CM

## 2015-08-04 NOTE — Telephone Encounter (Signed)
Dr Cats nurse ask you to call her concerning patient. [phone]

## 2015-08-04 NOTE — Patient Instructions (Signed)
Medication Instructions:  Same-no changes  Labwork: None  Testing/Procedures: None  Follow-Up: Your physician wants you to follow-up in: 6 months with Dr Irish Lack. You will receive a reminder letter in the mail two months in advance. If you don't receive a letter, please call our office to schedule the follow-up appointment.

## 2015-08-04 NOTE — Assessment & Plan Note (Signed)
The patient is having headaches that are being evaluated. He is to have an epidural injection into the cervical region. We made phone calls today specifically to be sure how old the scheduling is proceeding. He is now to call [facility] imaging to get the final scheduling. I've given approval for this epidural injection. Aspirin can be held if it must be.

## 2015-08-04 NOTE — Assessment & Plan Note (Signed)
The patient has coronary disease and he is post CABG in the past. His cath on May 29, 2015 revealed no new lesions to explain angina. There was a patent DES to the mid RCA. The LIMA was patent to a small downstream distal LAD with minimal LAD disease. There were patent vein grafts to D1 and D2 and RPAV. There was normal left ventricular function. There was sluggish flow down the grafts to the diagonals secondary to hypoperfusion following radial cocktail administered. Medical therapy is recommended. It was felt that nuclear stress testing could be used in the future to help assess his status. In the past he may have had a false-negative related to balanced ischemia. It is felt that this would not be the case at this time. I have chosen not to proceed with a nuclear stress test at this time. Plan to continue medical therapy.

## 2015-08-04 NOTE — Progress Notes (Signed)
Cardiology Office Note   Date:  08/04/2015   ID:  [name] [name], DOB [birth year], MRN [id number]  PCP:  Penni Homans, MD  Cardiologist:  Dola Argyle, MD   Chief Complaint  Patient presents with  . Appointment    Follow-up coronary artery disease      History of Present Illness: [name] [name] is a 69 y.o. male who presents today to follow-up coronary artery disease. I followed this patient for many years. His history with chest pain is complex. He has undergone bypass surgery. Most recently he was admitted in June, 2016 with chest discomfort. Cath was done. The results are outlined in the problem list below. Medical therapy was recommended. He does have some rare chest discomfort. Overall he is stable.    Past Medical History  Diagnosis Date  . Diabetes mellitus type II, uncontrolled   . OSA (obstructive sleep apnea)   . Hyperlipidemia   . RBBB (right bundle branch block)   . Depression     Bipolar  . Edema   . Hypertension   . Falling episodes     these have occurred in the past and again recurring 2011  . Spondylosis     C5-6, C6-7 MRI 2010  . CAD (coronary artery disease)     A. CABG in 2000,status post cardiac cath in 2006, 2009 ....continued chest pain and SOB despite oral medication adjestments including Ranexa. B. Cath November 2009/ mRCA - 2.75 x 23 Abbott Xience V drug-eluting stent ...11/26/2008 to distal  RCA leading to acute marginal.  C. Cath 07/2012 for CP - stable anatomy, med rx. d. cath 2015 and 05/30/2015 stable anatomy, consider Myoview if has CP again  . Hx of CABG     2000,  / one median sternotomy suture broken her chest x-ray November, 2010, no clinical significance  . Palpitations     event recorder showed sinus rhythm  . Nephrolithiasis   . Restless leg   . Dizziness   . Shortness of breath     CPX April, 2011, mild functional limitation, no clear pulmonary or cardiac limitation, possible deconditioning and mild chronotropic incompetence(  peak heart rate 130)  . Cerebral ischemia     MRI November, 2010, chronic microvascular ischemia  . Anemia     hemoglobin 7.4, iron deficiency, January, 2011, 2 unit transfusion, endoscopy normal, capsule endoscopy February, 2011 no small bowel abnormalities.   Most likely source gastric erosions, followed by GI  . H/O medication noncompliance     Due to loss of insurance  . Ejection fraction     EF 60%, echo, July 31, 2012  . Diabetes mellitus without complication   . Pain in limb 06/12/2009    Qualifier: Diagnosis of  By: [name]   . Low back pain 06/12/2009    Qualifier: Diagnosis of  By: [name]   . Thyroid disease   . Right knee pain 01/07/2015  . Advance care planning 05/01/2015  . ACP (advance care planning) 05/11/2015    Past Surgical History  Procedure Laterality Date  . Nasal septum surgery      UP3  . Coronary artery bypass graft      2000  . Left heart catheterization with coronary/graft angiogram N/A 08/01/2012    Procedure: LEFT HEART CATHETERIZATION WITH Beatrix Fetters;  Surgeon: [name], MD;  Location: [facility] CATH LAB;  Service: Cardiovascular;  Laterality: N/A;  . Left heart catheterization with coronary/graft angiogram N/A 01/03/2015  Procedure: LEFT HEART CATHETERIZATION WITH Beatrix Fetters;  Surgeon: [name], MD;  Location: [facility] CATH LAB;  Service: Cardiovascular;  Laterality: N/A;  . Percutaneous coronary stent intervention (pci-s)  10/2008    mRCA PCI  2.75 x 23 Abbott Xience V drug-eluting stent   . Cardiac catheterization N/A 05/30/2015    Procedure: Left Heart Cath and Coronary Angiography;  Surgeon: [name], MD;  Location: [place] CV LAB;  Service: Cardiovascular;  Laterality: N/A;    Patient Active Problem List   Diagnosis Date Noted  . Coronary artery disease involving native coronary artery     Priority: High  . Anemia     Priority: High  . Edema     Priority: High  . Falling episodes      Priority: High  . Hx of CABG     Priority: High  . Palpitations     Priority: High  . Shortness of breath     Priority: High  . Carotid artery disease 08/01/2015  . Spinal stenosis in cervical region 07/30/2015  . Occipital neuralgia of right side 07/15/2015  . Cervicogenic headache 07/15/2015  . Dizziness   . Chest pain 05/29/2015  . Medicare annual wellness visit, initial 05/11/2015  . Sun-damaged skin 05/11/2015  . Advance care planning 05/01/2015  . Concussion without loss of consciousness 02/18/2015  . Post-traumatic headache 02/18/2015  . Right knee pain 01/07/2015  . Abdominal pain, unspecified site 03/06/2013  . Right shoulder pain 10/24/2012  . Eczema 10/24/2012  . Bilateral groin pain 09/21/2012  . Ejection fraction   . Diabetes mellitus type II, uncontrolled 08/31/2011  . OSA (obstructive sleep apnea)   . Hyperlipidemia with target LDL less than 70   . Essential hypertension   . Insomnia 04/26/2011  . FATIGUE 12/10/2010  . Hypothyroidism 06/26/2010  . RESTLESS LEG SYNDROME 09/19/2009  . BENIGN POSITIONAL VERTIGO 09/19/2009  . DEPRESSIVE DISORDER NOT ELSEWHERE CLASSIFIED 07/09/2009  . RBBB 07/09/2009  . Low back pain 06/12/2009  . SCIATICA 01/17/2009  . GERD 01/09/2009  . TUBULOVILLOUS ADENOMA, COLON, HX OF 01/08/2009  . Presence of drug coated stent in right coronary artery 10/29/2008  . NEPHROLITHIASIS, HX OF 12/20/2007      Current Outpatient Prescriptions  Medication Sig Dispense Refill  . aspirin EC 325 MG tablet Take 325 mg by mouth daily.    Marland Kitchen atorvastatin (LIPITOR) 40 MG tablet Take 1 tablet (40 mg total) by mouth daily at 6 PM. 30 tablet 5  . BAYER CONTOUR NEXT TEST test strip     . clotrimazole-betamethasone (LOTRISONE) cream APPLY TO THE AFFECTED TWICE DAILY 30 g 6  . diclofenac sodium (VOLTAREN) 1 % GEL Apply 2 g topically 3 (three) times daily as needed. For pain to affected area 2 Tube 3  . gabapentin (NEURONTIN) 300 MG capsule Take one (1)  capsule (300 mg total) by mouth twice a day and take two (2) capsules (600 mg total) by mouth at bedtime.    . insulin aspart (NOVOLOG) 100 UNIT/ML injection Use 10-17 unit 3 times daily before meals (Patient taking differently: Inject 10-17 Units into the skin 3 (three) times daily with meals. Use 10-17 unit 3 times daily before meals) 15 mL 2  . Insulin Glargine (TOUJEO SOLOSTAR) 300 UNIT/ML SOPN Inject 42 Units into the skin at bedtime. 3 pen 2  . isosorbide mononitrate (IMDUR) 120 MG 24 hr tablet Take 1 tablet (120 mg total) by mouth daily. 90 tablet 1  . levothyroxine (SYNTHROID,  LEVOTHROID) 75 MCG tablet Take 1 tablet (75 mcg total) by mouth daily. 90 tablet 2  . metFORMIN (GLUCOPHAGE) 1000 MG tablet Take 1 tablet (1,000 mg total) by mouth 2 (two) times daily with a meal. 60 tablet 5  . nitroGLYCERIN (NITROSTAT) 0.4 MG SL tablet Place 1 tablet (0.4 mg total) under the tongue every 5 (five) minutes as needed (up to 3 doses). 25 tablet 4  . pantoprazole (PROTONIX) 40 MG tablet Take 40 mg by mouth daily.    . pramipexole (MIRAPEX) 0.25 MG tablet Take 1 tablet (0.25 mg total) by mouth at bedtime. 90 tablet 1  . sertraline (ZOLOFT) 100 MG tablet Take 1 tablet (100 mg total) by mouth 2 (two) times daily. 180 tablet 1  . tiZANidine (ZANAFLEX) 4 MG tablet Take 1 tablet (4 mg total) by mouth Nightly. 30 tablet 2  . topiramate (TOPAMAX) 50 MG tablet Take 1 tablet (50 mg total) by mouth at bedtime. 30 tablet 3   No current facility-administered medications for this visit.    Allergies:   Morphine and Shellfish-derived products    Social History:  The patient  reports that he has never smoked. He has never used smokeless tobacco. He reports that he does not drink alcohol or use illicit drugs.   Family History:  The patient's family history includes Cancer in his brother, brother, and maternal grandmother; Coronary artery disease in his brother; Diabetes in his brother, brother, brother, brother, and  mother; Heart attack in an other family member; Heart failure in his father and mother; Hypothyroidism in his brother; Irregular heart beat in his daughter; Stroke in his brother.    ROS:  Please see the history of present illness.     Patient denies fever, chills, headache, sweats, rash, change in vision, change in hearing, cough, nausea or vomiting, urinary symptoms. He has continued neck pain and headaches which are being evaluated. All other systems are reviewed and are negative.   PHYSICAL EXAM: VS:  BP 138/72 mmHg  Pulse 64  Ht 5\' 9"  (1.753 m)  Wt 178 lb (80.74 kg)  BMI 26.27 kg/m2  SpO2 98% , Patient is oriented to person time and place. Affect is normal. He is here with his wife. Head is atraumatic. Sclera and conjunctiva are normal. There is no jugulovenous distention. Lungs are clear. Respiratory effort is not labored. Cardiac exam reveals an S1 and S2. Abdomen is soft. There is no peripheral edema. There are no musculoskeletal deformities. There are no skin rashes.  EKG:   EKG is not done today.   Recent Labs: 05/01/2015: ALT 18; TSH 2.84 05/29/2015: B Natriuretic Peptide 22.6 06/19/2015: BUN 14; Creatinine, Ser 1.26*; Hemoglobin 14.2; Platelets 164; Potassium 4.4; Sodium 140    Lipid Panel    Component Value Date/Time   CHOL 162 05/01/2015 1136   TRIG 107.0 05/01/2015 1136   HDL 33.90* 05/01/2015 1136   CHOLHDL 5 05/01/2015 1136   VLDL 21.4 05/01/2015 1136   LDLCALC 107* 05/01/2015 1136   LDLDIRECT 46.5 09/21/2012 1658      Wt Readings from Last 3 Encounters:  08/04/15 178 lb (80.74 kg)  07/30/15 180 lb (81.647 kg)  07/24/15 175 lb 2 oz (79.436 kg)      Current medicines are reviewed. The patient has never received Renexa.     ASSESSMENT AND PLAN:

## 2015-08-04 NOTE — Telephone Encounter (Signed)
DR Tamala Julian spoke to Dr. [name].

## 2015-08-05 ENCOUNTER — Telehealth: Payer: Self-pay | Admitting: Family Medicine

## 2015-08-05 NOTE — Telephone Encounter (Signed)
Patient would like to know when the shot for neck will be ordered please advise

## 2015-08-06 NOTE — Telephone Encounter (Signed)
Epidural injection has already been ordered. I made the pt aware I called over to [place] & left a message for them to either call me or him back to schedule the epidural injection.

## 2015-08-13 ENCOUNTER — Ambulatory Visit
Admission: RE | Admit: 2015-08-13 | Discharge: 2015-08-13 | Disposition: A | Discharge status: Home / Self Care | Payer: Medicare PPO | Source: Ambulatory Visit | Attending: Family Medicine | Admitting: Family Medicine

## 2015-08-13 DIAGNOSIS — M542 Cervicalgia: Secondary | ICD-10-CM

## 2015-08-13 MED ORDER — IOHEXOL 300 MG/ML  SOLN
1.0000 mL | Freq: Once | INTRAMUSCULAR | Status: DC | PRN
  Administered 2015-08-13: 1 mL via EPIDURAL

## 2015-08-13 MED ORDER — TRIAMCINOLONE ACETONIDE 40 MG/ML IJ SUSP (RADIOLOGY)
60.0000 mg | Freq: Once | INTRAMUSCULAR | Status: AC
  Administered 2015-08-13: 60 mg via EPIDURAL

## 2015-08-13 NOTE — Discharge Instructions (Signed)

## 2015-08-15 ENCOUNTER — Ambulatory Visit: Payer: Medicare PPO | Admitting: Neurology

## 2015-08-22 ENCOUNTER — Ambulatory Visit: Payer: Medicare PPO

## 2015-08-22 DIAGNOSIS — Z23 Encounter for immunization: Secondary | ICD-10-CM

## 2015-08-22 MED ORDER — INFLUENZA VAC SPLIT QUAD 0.5 ML IM SUSY
0.5000 mL | PREFILLED_SYRINGE | Freq: Once | INTRAMUSCULAR | Status: AC
  Administered 2015-08-22 (×2): 0.5 mL via INTRAMUSCULAR

## 2015-09-04 ENCOUNTER — Ambulatory Visit: Payer: Medicare PPO | Admitting: Family Medicine

## 2015-09-08 ENCOUNTER — Ambulatory Visit: Payer: Medicare PPO | Admitting: Family Medicine

## 2015-09-15 ENCOUNTER — Emergency Department (HOSPITAL_BASED_OUTPATIENT_CLINIC_OR_DEPARTMENT_OTHER): Payer: Medicare PPO

## 2015-09-15 ENCOUNTER — Ambulatory Visit: Payer: Medicare PPO | Admitting: Family Medicine

## 2015-09-15 ENCOUNTER — Emergency Department (HOSPITAL_BASED_OUTPATIENT_CLINIC_OR_DEPARTMENT_OTHER)
Admission: EM | Admit: 2015-09-15 | Discharge: 2015-09-15 | Disposition: A | Discharge status: Home / Self Care | Payer: Medicare PPO | Attending: Emergency Medicine | Admitting: Emergency Medicine

## 2015-09-15 ENCOUNTER — Encounter (HOSPITAL_BASED_OUTPATIENT_CLINIC_OR_DEPARTMENT_OTHER): Payer: Self-pay | Admitting: Emergency Medicine

## 2015-09-15 DIAGNOSIS — M542 Cervicalgia: Secondary | ICD-10-CM | POA: Diagnosis not present

## 2015-09-15 DIAGNOSIS — Z9889 Other specified postprocedural states: Secondary | ICD-10-CM | POA: Diagnosis not present

## 2015-09-15 DIAGNOSIS — Z862 Personal history of diseases of the blood and blood-forming organs and certain disorders involving the immune mechanism: Secondary | ICD-10-CM | POA: Insufficient documentation

## 2015-09-15 DIAGNOSIS — F319 Bipolar disorder, unspecified: Secondary | ICD-10-CM | POA: Insufficient documentation

## 2015-09-15 DIAGNOSIS — E079 Disorder of thyroid, unspecified: Secondary | ICD-10-CM | POA: Insufficient documentation

## 2015-09-15 DIAGNOSIS — Z951 Presence of aortocoronary bypass graft: Secondary | ICD-10-CM | POA: Insufficient documentation

## 2015-09-15 DIAGNOSIS — E1310 Other specified diabetes mellitus with ketoacidosis without coma: Secondary | ICD-10-CM

## 2015-09-15 DIAGNOSIS — E785 Hyperlipidemia, unspecified: Secondary | ICD-10-CM | POA: Insufficient documentation

## 2015-09-15 DIAGNOSIS — Z87442 Personal history of urinary calculi: Secondary | ICD-10-CM | POA: Insufficient documentation

## 2015-09-15 DIAGNOSIS — I2510 Atherosclerotic heart disease of native coronary artery without angina pectoris: Secondary | ICD-10-CM | POA: Diagnosis not present

## 2015-09-15 DIAGNOSIS — Z8669 Personal history of other diseases of the nervous system and sense organs: Secondary | ICD-10-CM | POA: Diagnosis not present

## 2015-09-15 DIAGNOSIS — Z7982 Long term (current) use of aspirin: Secondary | ICD-10-CM | POA: Insufficient documentation

## 2015-09-15 DIAGNOSIS — R739 Hyperglycemia, unspecified: Secondary | ICD-10-CM

## 2015-09-15 DIAGNOSIS — I10 Essential (primary) hypertension: Secondary | ICD-10-CM | POA: Insufficient documentation

## 2015-09-15 DIAGNOSIS — Z79899 Other long term (current) drug therapy: Secondary | ICD-10-CM | POA: Diagnosis not present

## 2015-09-15 DIAGNOSIS — Z794 Long term (current) use of insulin: Secondary | ICD-10-CM | POA: Diagnosis not present

## 2015-09-15 DIAGNOSIS — E1165 Type 2 diabetes mellitus with hyperglycemia: Secondary | ICD-10-CM | POA: Diagnosis not present

## 2015-09-15 DIAGNOSIS — Z9119 Patient's noncompliance with other medical treatment and regimen: Secondary | ICD-10-CM | POA: Insufficient documentation

## 2015-09-15 LAB — I-STAT VENOUS BLOOD GAS, ED
ACID-BASE DEFICIT: 4 mmol/L — AB (ref 0.0–2.0)
BICARBONATE: 21 meq/L (ref 20.0–24.0)
O2 SAT: 70 %
PCO2 VEN: 38 mmHg — AB (ref 45.0–50.0)
PO2 VEN: 39 mmHg (ref 30.0–45.0)
Patient temperature: 98.7
TCO2: 22 mmol/L (ref 0–100)
pH, Ven: 7.351 — ABNORMAL HIGH (ref 7.250–7.300)

## 2015-09-15 LAB — COMPREHENSIVE METABOLIC PANEL
ALBUMIN: 4.3 g/dL (ref 3.5–5.0)
ALK PHOS: 117 U/L (ref 38–126)
ALT: 11 U/L — AB (ref 17–63)
AST: 13 U/L — AB (ref 15–41)
Anion gap: 8 (ref 5–15)
BILIRUBIN TOTAL: 0.9 mg/dL (ref 0.3–1.2)
BUN: 18 mg/dL (ref 6–20)
CO2: 22 mmol/L (ref 22–32)
CREATININE: 1.04 mg/dL (ref 0.61–1.24)
Calcium: 8.7 mg/dL — ABNORMAL LOW (ref 8.9–10.3)
Chloride: 96 mmol/L — ABNORMAL LOW (ref 101–111)
GFR calc Af Amer: >60 mL/min (ref >60)
GFR calc non Af Amer: >60 mL/min (ref >60)
GLUCOSE: 737 mg/dL — AB (ref 65–99)
POTASSIUM: 3.9 mmol/L (ref 3.5–5.1)
Sodium: 126 mmol/L — ABNORMAL LOW (ref 135–145)
TOTAL PROTEIN: 7.3 g/dL (ref 6.5–8.1)

## 2015-09-15 LAB — CBC WITH DIFFERENTIAL/PLATELET
Basophils Absolute: 0 10*3/uL (ref 0.0–0.1)
Basophils Relative: 0 %
Eosinophils Absolute: 0.1 10*3/uL (ref 0.0–0.7)
Eosinophils Relative: 2 %
HCT: 42.3 % (ref 39.0–52.0)
HEMOGLOBIN: 15.2 g/dL (ref 13.0–17.0)
LYMPHS ABS: 1.5 10*3/uL (ref 0.7–4.0)
Lymphocytes Relative: 27 %
MCH: 30.8 pg (ref 26.0–34.0)
MCHC: 35.9 g/dL (ref 30.0–36.0)
MCV: 85.8 fL (ref 78.0–100.0)
Monocytes Absolute: 0.4 10*3/uL (ref 0.1–1.0)
Monocytes Relative: 8 %
NEUTROS PCT: 63 %
Neutro Abs: 3.7 10*3/uL (ref 1.7–7.7)
PLATELETS: 136 10*3/uL — AB (ref 150–400)
RBC: 4.93 MIL/uL (ref 4.22–5.81)
RDW: 12.7 % (ref 11.5–15.5)
WBC: 5.8 10*3/uL (ref 4.0–10.5)

## 2015-09-15 LAB — URINE MICROSCOPIC-ADD ON

## 2015-09-15 LAB — URINALYSIS, ROUTINE W REFLEX MICROSCOPIC
BILIRUBIN URINE: NEGATIVE
Hgb urine dipstick: NEGATIVE
KETONES UR: 15 mg/dL — AB
Leukocytes, UA: NEGATIVE
Nitrite: NEGATIVE
PH: 5 (ref 5.0–8.0)
Protein, ur: NEGATIVE mg/dL
Specific Gravity, Urine: 1.037 — ABNORMAL HIGH (ref 1.005–1.030)
Urobilinogen, UA: 0.2 mg/dL (ref 0.0–1.0)

## 2015-09-15 LAB — CBG MONITORING, ED
GLUCOSE-CAPILLARY: 580 mg/dL — AB (ref 65–99)
GLUCOSE-CAPILLARY: 203 mg/dL — AB (ref 65–99)
Glucose-Capillary: >600 mg/dL (ref 65–99)
Glucose-Capillary: 461 mg/dL — ABNORMAL HIGH (ref 65–99)
Glucose-Capillary: 299 mg/dL — ABNORMAL HIGH (ref 65–99)

## 2015-09-15 LAB — TROPONIN I

## 2015-09-15 MED ORDER — SODIUM CHLORIDE 0.9 % IV SOLN
INTRAVENOUS | Status: DC
  Administered 2015-09-15: 5.2 [IU]/h via INTRAVENOUS

## 2015-09-15 MED ORDER — DEXTROSE-NACL 5-0.45 % IV SOLN: INTRAVENOUS | Status: DC

## 2015-09-15 MED ORDER — SODIUM CHLORIDE 0.9 % IV BOLUS (SEPSIS)
500.0000 mL | Freq: Once | INTRAVENOUS | Status: AC
  Administered 2015-09-15: 500 mL via INTRAVENOUS

## 2015-09-15 NOTE — ED Notes (Signed)
MD at bedside. 

## 2015-09-15 NOTE — ED Notes (Signed)
Patient transported to X-ray 

## 2015-09-15 NOTE — ED Provider Notes (Signed)
Patient accepted in sign out.  Patient well appearing on reevaluation.  Feels well and would like to go home.  Care management helping to coordinate outpatient support for payment for medications as best they can.  Patient to be called tomorrow for this issue.  Patient discharged home in stable condition with strict return precautions and instruction to follow up outpatient.  Harvel Quale, MD 09/15/15 [phone]

## 2015-09-15 NOTE — Discharge Instructions (Signed)
Hyperglycemia Your blood sugar was very high today but there was no sign of what is called acidemia.  Make sure you follow up as directed and keep working with available resources to help you get your medications so this can be better controlled.  Hyperglycemia occurs when the glucose (sugar) in your blood is too high. Hyperglycemia can happen for many reasons, but it most often happens to people who do not know they have diabetes or are not managing their diabetes properly.  CAUSES  Whether you have diabetes or not, there are other causes of hyperglycemia. Hyperglycemia can occur when you have diabetes, but it can also occur in other situations that you might not be as aware of, such as: Diabetes  If you have diabetes and are having problems controlling your blood glucose, hyperglycemia could occur because of some of the following reasons:  Not following your meal plan.  Not taking your diabetes medications or not taking it properly.  Exercising less or doing less activity than you normally do.  Being sick. Pre-diabetes  This cannot be ignored. Before people develop Type 2 diabetes, they almost always have "pre-diabetes." This is when your blood glucose levels are higher than normal, but not yet high enough to be diagnosed as diabetes. Research has shown that some long-term damage to the body, especially the heart and circulatory system, may already be occurring during pre-diabetes. If you take action to manage your blood glucose when you have pre-diabetes, you may delay or prevent Type 2 diabetes from developing. Stress  If you have diabetes, you may be "diet" controlled or on oral medications or insulin to control your diabetes. However, you may find that your blood glucose is higher than usual in the hospital whether you have diabetes or not. This is often referred to as "stress hyperglycemia." Stress can elevate your blood glucose. This happens because of hormones put out by the body  during times of stress. If stress has been the cause of your high blood glucose, it can be followed regularly by your caregiver. That way he/she can make sure your hyperglycemia does not continue to get worse or progress to diabetes. Steroids  Steroids are medications that act on the infection fighting system (immune system) to block inflammation or infection. One side effect can be a rise in blood glucose. Most people can produce enough extra insulin to allow for this rise, but for those who cannot, steroids make blood glucose levels go even higher. It is not unusual for steroid treatments to "uncover" diabetes that is developing. It is not always possible to determine if the hyperglycemia will go away after the steroids are stopped. A special blood test called an A1c is sometimes done to determine if your blood glucose was elevated before the steroids were started. SYMPTOMS  Thirsty.  Frequent urination.  Dry mouth.  Blurred vision.  Tired or fatigue.  Weakness.  Sleepy.  Tingling in feet or leg. DIAGNOSIS  Diagnosis is made by monitoring blood glucose in one or all of the following ways:  A1c test. This is a chemical found in your blood.  Fingerstick blood glucose monitoring.  Laboratory results. TREATMENT  First, knowing the cause of the hyperglycemia is important before the hyperglycemia can be treated. Treatment may include, but is not be limited to:  Education.  Change or adjustment in medications.  Change or adjustment in meal plan.  Treatment for an illness, infection, etc.  More frequent blood glucose monitoring.  Change in exercise plan.  Decreasing or stopping steroids.  Lifestyle changes. HOME CARE INSTRUCTIONS   Test your blood glucose as directed.  Exercise regularly. Your caregiver will give you instructions about exercise. Pre-diabetes or diabetes which comes on with stress is helped by exercising.  Eat wholesome, balanced meals. Eat often and at  regular, fixed times. Your caregiver or nutritionist will give you a meal plan to guide your sugar intake.  Being at an ideal weight is important. If needed, losing as little as 10 to 15 pounds may help improve blood glucose levels. SEEK MEDICAL CARE IF:   You have questions about medicine, activity, or diet.  You continue to have symptoms (problems such as increased thirst, urination, or weight gain). SEEK IMMEDIATE MEDICAL CARE IF:   You are vomiting or have diarrhea.  Your breath smells fruity.  You are breathing faster or slower.  You are very sleepy or incoherent.  You have numbness, tingling, or pain in your feet or hands.  You have chest pain.  Your symptoms get worse even though you have been following your caregiver's orders.  If you have any other questions or concerns.   This information is not intended to replace advice given to you by your health care provider. Make sure you discuss any questions you have with your health care provider.   Document Released: 05/18/2001 Document Revised: 02/14/2012 Document Reviewed: 07/29/2015 Elsevier Interactive Patient Education [employer].

## 2015-09-15 NOTE — ED Provider Notes (Signed)
CSN: [id number]     Arrival date & time 09/15/15  1349 History   First MD Initiated Contact with Patient 09/15/15 1401     Chief Complaint  Patient presents with  . Neck Pain  . Hyperglycemia     (Consider location/radiation/quality/duration/timing/severity/associated sxs/prior Treatment) HPI Comments: Patient with a history of diabetes, coronary artery disease, hypertension and hyperlipidemia presents with generalized weakness. He states that he's been out of his insulin for about a month and he has been out of all his other medications for about 2 months. He states over the last few weeks she's had increasing weakness. Today he just felt worse with no energy. He denies any nausea or vomiting. He states he had a concussion in April of this year. He was experiencing headaches and neck pain related to this. He had an epidural injection which resolved the headaches for a short period time but over the last month the headaches have come back again with associated neck pain and dizziness. These are the same symptoms that he had previously. He denies any new injuries. He denies any numbness or weakness to his extremities. He denies any speech deficits or vision changes. He does report an increased cough. There is no chest pain or shortness of breath currently. He does state that he's been having frequent episodes of chest pain since she's been out of his isosorbide. He states when he takes his isosorbide he doesn't notice any chest pain but since he's been out of it he has had more frequent episodes of chest pain. His chest pain is in any different than these had in the past. It's typical for pain these had before when he hasn't taken his isosorbide.  Patient is a 69 y.o. male presenting with neck pain and hyperglycemia.  Neck Pain Associated symptoms: headaches   Associated symptoms: no chest pain, no fever, no numbness and no weakness   Hyperglycemia Associated symptoms: dizziness and fatigue    Associated symptoms: no abdominal pain, no chest pain, no diaphoresis, no fever, no nausea, no shortness of breath, no vomiting and no weakness     Past Medical History  Diagnosis Date  . Diabetes mellitus type II, uncontrolled ([place])   . OSA (obstructive sleep apnea)   . Hyperlipidemia   . RBBB (right bundle branch block)   . Depression     Bipolar  . Edema   . Hypertension   . Falling episodes     these have occurred in the past and again recurring 2011  . Spondylosis     C5-6, C6-7 MRI 2010  . CAD (coronary artery disease)     A. CABG in 2000,status post cardiac cath in 2006, 2009 ....continued chest pain and SOB despite oral medication adjestments including Ranexa. B. Cath November 2009/ mRCA - 2.75 x 23 Abbott Xience V drug-eluting stent ...11/26/2008 to distal  RCA leading to acute marginal.  C. Cath 07/2012 for CP - stable anatomy, med rx. d. cath 2015 and 05/30/2015 stable anatomy, consider Myoview if has CP again  . Hx of CABG     2000,  / one median sternotomy suture broken her chest x-ray November, 2010, no clinical significance  . Palpitations     event recorder showed sinus rhythm  . Nephrolithiasis   . Restless leg   . Dizziness   . Shortness of breath     CPX April, 2011, mild functional limitation, no clear pulmonary or cardiac limitation, possible deconditioning and mild chronotropic incompetence( peak heart rate  130)  . Cerebral ischemia     MRI November, 2010, chronic microvascular ischemia  . Anemia     hemoglobin 7.4, iron deficiency, January, 2011, 2 unit transfusion, endoscopy normal, capsule endoscopy February, 2011 no small bowel abnormalities.   Most likely source gastric erosions, followed by GI  . H/O medication noncompliance     Due to loss of insurance  . Ejection fraction     EF 60%, echo, July 31, 2012  . Diabetes mellitus without complication ([place])   . Pain in limb 06/12/2009    Qualifier: Diagnosis of  By: [name]   . Low back pain  06/12/2009    Qualifier: Diagnosis of  By: [name]   . Thyroid disease   . Right knee pain 01/07/2015  . Advance care planning 05/01/2015  . ACP (advance care planning) 05/11/2015   Past Surgical History  Procedure Laterality Date  . Nasal septum surgery      UP3  . Coronary artery bypass graft      2000  . Left heart catheterization with coronary/graft angiogram N/A 08/01/2012    Procedure: LEFT HEART CATHETERIZATION WITH Beatrix Fetters;  Surgeon: [name], MD;  Location: [facility] CATH LAB;  Service: Cardiovascular;  Laterality: N/A;  . Left heart catheterization with coronary/graft angiogram N/A 01/03/2015    Procedure: LEFT HEART CATHETERIZATION WITH Beatrix Fetters;  Surgeon: [name], MD;  Location: [facility] CATH LAB;  Service: Cardiovascular;  Laterality: N/A;  . Percutaneous coronary stent intervention (pci-s)  10/2008    mRCA PCI  2.75 x 23 Abbott Xience V drug-eluting stent   . Cardiac catheterization N/A 05/30/2015    Procedure: Left Heart Cath and Coronary Angiography;  Surgeon: [name], MD;  Location: [facility] CV LAB;  Service: Cardiovascular;  Laterality: N/A;   Family History  Problem Relation Age of Onset  . Cancer Brother     pancreatic cancer  . Diabetes Brother     4   . Stroke Brother   . Diabetes Brother   . Cancer Brother   . Diabetes Brother   . Coronary artery disease Brother   . Diabetes Brother   . Hypothyroidism Brother   . Heart attack      Nephew  . Diabetes Mother   . Heart failure Mother   . Heart failure Father   . Irregular heart beat Daughter   . Cancer Maternal Grandmother     unknown    Social History  Substance Use Topics  . Smoking status: Never Smoker   . Smokeless tobacco: Never Used  . Alcohol Use: No    Review of Systems  Constitutional: Positive for fatigue. Negative for fever, chills and diaphoresis.  HENT: Negative for congestion, rhinorrhea and sneezing.   Eyes: Negative.    Respiratory: Positive for cough. Negative for chest tightness and shortness of breath.   Cardiovascular: Negative for chest pain and leg swelling.  Gastrointestinal: Negative for nausea, vomiting, abdominal pain, diarrhea and blood in stool.  Genitourinary: Negative for frequency, hematuria, flank pain and difficulty urinating.  Musculoskeletal: Positive for neck pain. Negative for back pain and arthralgias.  Skin: Negative for rash.  Neurological: Positive for dizziness and headaches. Negative for speech difficulty, weakness and numbness.      Allergies  Morphine and Shellfish-derived products  Home Medications   Prior to Admission medications   Medication Sig Start Date End Date Taking? Authorizing Provider  aspirin EC 325 MG tablet Take 325  mg by mouth daily.    Historical Provider, MD  atorvastatin (LIPITOR) 40 MG tablet Take 1 tablet (40 mg total) by mouth daily at 6 PM. 05/30/15   Almyra Deforest, PA  BAYER CONTOUR NEXT TEST test strip  07/09/15   Historical Provider, MD  clotrimazole-betamethasone (LOTRISONE) cream APPLY TO THE AFFECTED TWICE DAILY 05/01/15   [name], MD  diclofenac sodium (VOLTAREN) 1 % GEL Apply 2 g topically 3 (three) times daily as needed. For pain to affected area 05/01/15   [name], MD  gabapentin (NEURONTIN) 300 MG capsule Take one (1) capsule (300 mg total) by mouth twice a day and take two (2) capsules (600 mg total) by mouth at bedtime.    Historical Provider, MD  insulin aspart (NOVOLOG) 100 UNIT/ML injection Use 10-17 unit 3 times daily before meals Patient taking differently: Inject 10-17 Units into the skin 3 (three) times daily with meals. Use 10-17 unit 3 times daily before meals 12/12/14   Philemon Kingdom, MD  Insulin Glargine (TOUJEO SOLOSTAR) 300 UNIT/ML SOPN Inject 42 Units into the skin at bedtime. 02/20/15   [name], MD  isosorbide mononitrate (IMDUR) 120 MG 24 hr tablet Take 1 tablet (120 mg total) by mouth daily. 05/08/13   Doe-Hyun R  Shawna Orleans, DO  levothyroxine (SYNTHROID, LEVOTHROID) 75 MCG tablet Take 1 tablet (75 mcg total) by mouth daily. 05/01/15   [name], MD  metFORMIN (GLUCOPHAGE) 1000 MG tablet Take 1 tablet (1,000 mg total) by mouth 2 (two) times daily with a meal. 06/12/15   [name], MD  nitroGLYCERIN (NITROSTAT) 0.4 MG SL tablet Place 1 tablet (0.4 mg total) under the tongue every 5 (five) minutes as needed (up to 3 doses). 05/08/13   Doe-Hyun R Shawna Orleans, DO  pantoprazole (PROTONIX) 40 MG tablet Take 40 mg by mouth daily.    Historical Provider, MD  pramipexole (MIRAPEX) 0.25 MG tablet Take 1 tablet (0.25 mg total) by mouth at bedtime. 05/08/13   Doe-Hyun R Shawna Orleans, DO  sertraline (ZOLOFT) 100 MG tablet Take 1 tablet (100 mg total) by mouth 2 (two) times daily. 07/24/15   [name], MD  tiZANidine (ZANAFLEX) 4 MG tablet Take 1 tablet (4 mg total) by mouth Nightly. 07/30/15   Lyndal Pulley, DO  topiramate (TOPAMAX) 50 MG tablet Take 1 tablet (50 mg total) by mouth at bedtime. 04/25/15   Pieter Partridge, DO   BP 141/67 mmHg  Pulse 68  Temp(Src) 97.8 F (36.6 C) (Oral)  Resp 13  Ht 5\' 9"  (1.753 m)  Wt 178 lb (80.74 kg)  BMI 26.27 kg/m2  SpO2 98% Physical Exam  Constitutional: He is oriented to person, place, and time. He appears well-developed and well-nourished.  HENT:  Head: Normocephalic and atraumatic.  Eyes: Pupils are equal, round, and reactive to light.  Neck: Normal range of motion. Neck supple.  Cardiovascular: Normal rate, regular rhythm and normal heart sounds.   Pulmonary/Chest: Effort normal and breath sounds normal. No respiratory distress. He has no wheezes. He has no rales. He exhibits no tenderness.  Abdominal: Soft. Bowel sounds are normal. There is no tenderness. There is no rebound and no guarding.  Musculoskeletal: Normal range of motion. He exhibits no edema.  Lymphadenopathy:    He has no cervical adenopathy.  Neurological: He is alert and oriented to person, place, and time. He has normal  strength. No cranial nerve deficit or sensory deficit. GCS eye subscore is 4. GCS verbal  subscore is 5. GCS motor subscore is 6.  Finger-nose intact, no pronator drift  Skin: Skin is warm and dry. No rash noted.  Psychiatric: He has a normal mood and affect.    ED Course  Procedures (including critical care time) Labs Review Labs Reviewed  COMPREHENSIVE METABOLIC PANEL - Abnormal; Notable for the following:    Sodium 126 (*)    Chloride 96 (*)    Glucose, Bld 737 (*)    Calcium 8.7 (*)    AST 13 (*)    ALT 11 (*)    All other components within normal limits  CBC WITH DIFFERENTIAL/PLATELET - Abnormal; Notable for the following:    Platelets 136 (*)    All other components within normal limits  URINALYSIS, ROUTINE W REFLEX MICROSCOPIC (NOT AT [facility]) - Abnormal; Notable for the following:    Specific Gravity, Urine 1.037 (*)    Glucose, UA >1000 (*)    Ketones, ur 15 (*)    All other components within normal limits  CBG MONITORING, ED - Abnormal; Notable for the following:    Glucose-Capillary >600 (*)    All other components within normal limits  I-STAT VENOUS BLOOD GAS, ED - Abnormal; Notable for the following:    pH, Ven 7.351 (*)    pCO2, Ven 38.0 (*)    Acid-base deficit 4.0 (*)    All other components within normal limits  CBG MONITORING, ED - Abnormal; Notable for the following:    Glucose-Capillary 580 (*)    All other components within normal limits  TROPONIN I  URINE MICROSCOPIC-ADD ON  BLOOD GAS, VENOUS    Imaging Review Dg Chest 2 View  09/15/2015   CLINICAL DATA:  Cough. Diabetes with abnormal blood sugar. Chest pain for several days.  EXAM: CHEST  2 VIEW  COMPARISON:  06/19/2015  FINDINGS: Prior CABG.  The lungs appear clear.  No pleural effusion.  Mild thoracic spondylosis.  IMPRESSION: 1. Prior CABG; no active abnormality identified.   Electronically Signed   By: Van Clines [name]   On: 09/15/2015 15:18   I have personally reviewed and evaluated these  images and lab results as part of my medical decision-making.   EKG Interpretation   Date/Time:  Monday September 15 2015 14:31:21 EDT Ventricular Rate:  69 PR Interval:  172 QRS Duration: 114 QT Interval:  460 QTC Calculation: 492 R Axis:   19 Text Interpretation:  Normal sinus rhythm with sinus arrhythmia Right  bundle branch block Abnormal ECG similar to prior EKG, T wave inversion  inferiorly is different Confirmed by Monica Codd  MD, Chereese Cilento ([id number]) on  09/15/2015 2:53:53 PM      MDM   Final diagnoses:  Hyperglycemia    Patient presents with hyperglycemia. There is no evidence of DKA. He's not acidotic. His troponin is negative and he doesn't have any significant symptoms consistent with acute coronary syndrome. He was given IV fluids and started on glucose stabilizer. I feel that he likely can be discharged home after his sugar stabilizes. I have contacted the case manager at [facility] to see if she can be of any help in helping the patient taking his medications.  Care turned over to Dr. Alfonse Spruce.    Malvin Johns, MD 09/15/15 [phone]

## 2015-09-15 NOTE — ED Notes (Signed)
Patient states that he has not taken his medication for "a while", Reports that he thinking his blood sugar is way out of wack. The patient reports thought that his neck is hurting and that his his what is different today.

## 2015-09-15 NOTE — Care Management (Signed)
ED CM receive call from Dr. Tamera Punt concerning medication assistance. Patient presented to [facility] with c/o of running out of insulin for a month and other medications x 2 month. Patient reports not being able to afford co-pays. CBG 700 in ED. Patient has [facility] Medicare apparently with high deductible.  Patient does not qualify for [facility] medication assistance program because, he has health insurance. CM contacted [facility] care management. Regarding patient's issue, referral was placed with Erenest Rasher RN CM [facility]. CM spoke with patient and explained that he does not met the criteria for medication thru our [facility] program, but that [facility] management will follow up and  perform an assessment, Patient is agreeable with plan. Provided [facility] contact information on AVS. Updated Dr. Alfonse Spruce EDP assuming care. No further ED CM needs identified.

## 2015-09-15 NOTE — ED Notes (Signed)
I took CBG sample, resulted in "exceeds Limit", I notified nurse.

## 2015-09-15 NOTE — ED Notes (Signed)
Pt talking with Dr. Tamera Punt. States he has been out of his routine medications for 1-2 months. Unable to afford. 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 Also reports dizziness and off balance. States "I feel bad every day" since stopping medications

## 2015-09-16 ENCOUNTER — Telehealth: Payer: Self-pay | Admitting: Family Medicine

## 2015-09-16 MED ORDER — INSULIN ASPART 100 UNIT/ML ~~LOC~~ SOLN: 10.0000 [IU] | Freq: Three times a day (TID) | SUBCUTANEOUS | Status: DC

## 2015-09-16 MED ORDER — INSULIN GLARGINE 300 UNIT/ML ~~LOC~~ SOPN: 42.0000 [IU] | PEN_INJECTOR | Freq: Every day | SUBCUTANEOUS | Status: DC

## 2015-09-16 NOTE — Telephone Encounter (Signed)
Caller name: Adriyan Carroway  Relationship to patient: Self   Can be reached: [phone]  Pharmacy: [id number] - [place], [place] - [address] AT [place]  Reason for call: Pt says that he has been in the hospital because of his blood sugar. He is requesting a refill on his insulin .    He says that he needs both . insulin aspart (NOVOLOG) 100 UNIT/ML injection and also Insulin Glargine (TOUJEO SOLOSTAR) 300 UNIT/ML SOPN

## 2015-09-16 NOTE — Telephone Encounter (Signed)
Insulins sent in to [facility].  Called the patient informed sent in.

## 2015-09-16 NOTE — Telephone Encounter (Signed)
Looks like both of his insulins were sent in on 09/16/2015. I was told as I was leaving late on 09/16/2015 he had called. Please check with him and make sure he has what he needs and arrange for a hospital follow up with me or someone. I have already agreed to double book someone on Thursday and also have a meeting so might not be able to squeeze him in that day but maybe Friday or early next week?

## 2015-09-16 NOTE — Telephone Encounter (Signed)
Pt need a hospital follow up scheduled. Spoke with Dr. Jacinto Reap, she will look to see when it can be scheduled.

## 2015-09-17 ENCOUNTER — Telehealth: Payer: Self-pay | Admitting: Family Medicine

## 2015-09-17 NOTE — Telephone Encounter (Signed)
No charge for now

## 2015-09-17 NOTE — Telephone Encounter (Signed)
Appointment has been made

## 2015-09-17 NOTE — Telephone Encounter (Signed)
Pt was no show 09/08/15 8:00am, pt called 7:50am 10/3 and rs for 09/15/15. Pt was no show 09/15/15 bc they went to ER. Pt is scheduled for hospital f/u 10/17  Charge or no charge for 09/08/15? Charge or no charge for 09/15/15?

## 2015-09-22 ENCOUNTER — Telehealth: Payer: Self-pay | Admitting: Family Medicine

## 2015-09-22 ENCOUNTER — Ambulatory Visit: Payer: Medicare PPO | Admitting: Family Medicine

## 2015-09-22 NOTE — Telephone Encounter (Signed)
spouse called to cancel  Patient appointment charge or no charge

## 2015-09-22 NOTE — Telephone Encounter (Signed)
No charge since he called

## 2015-10-03 ENCOUNTER — Telehealth: Payer: Self-pay | Admitting: Neurology

## 2015-10-03 NOTE — Telephone Encounter (Signed)
Pt/wife/called to inform that he is having more ha's and stumbles when he [phone]

## 2015-10-06 NOTE — Telephone Encounter (Signed)
He can make an appointment for another occipital nerve block.

## 2015-10-06 NOTE — Telephone Encounter (Signed)
[facility] patient.

## 2015-10-06 NOTE — Telephone Encounter (Signed)
Spoke with wife. States pt is having severe headaches, and equilibrium issues. Pt's headaches are all day, everyday. He had some hydrocodone that he was taking for headaches, but he's out of those. He has been using OTC tylenol. No relief. He's been using ice packs daily. Wants to know what they can do for headaches. States he had an injection previously for headaches that did help some. (after reviewing chart, I believe she is referencing a triamcinolone acetonide (KENALOG-40) injection done 08/13/15. Please advise.

## 2015-10-06 NOTE — Telephone Encounter (Signed)
I thought that the headaches were coming from the neck.  He should have followed up with Dr. Tamala Julian for possible epidural injection.  Was this ever done?  If these are the same headaches, we can set him up for another occipital nerve block.  Although the real cause of the headaches is the neck, so I do recommend follow up with Dr. Tamala Julian too.  The dizziness may be related to his diabetes, so I would contact his PCP regarding this.

## 2015-10-06 NOTE — Telephone Encounter (Signed)
Pt/wife/ to inform that pt continue to have very bad ha's//and still stumbles when he walks//call back @ [phone]

## 2015-10-06 NOTE — Telephone Encounter (Signed)
Pt did have epidural injection done at the beginning of September. Believes that it helped for about a month. Has only had one. Pt is interested in having another occipital nerve block injection. These are the same headaches he has been experiencing. Will contact Dr. Tamala Julian and PCP. Number to Dr.Smith given. Please advise.

## 2015-10-06 NOTE — Telephone Encounter (Signed)
Pt aware. States he will call back to schedule, currently over at [place] trying to schedule another epidural injection.

## 2015-10-07 ENCOUNTER — Encounter: Payer: Self-pay | Admitting: Family Medicine

## 2015-10-07 ENCOUNTER — Ambulatory Visit (INDEPENDENT_AMBULATORY_CARE_PROVIDER_SITE_OTHER): Payer: Medicare PPO | Admitting: Family Medicine

## 2015-10-07 VITALS — BP 138/70 | HR 75 | Ht 69.0 in | Wt 177.0 lb

## 2015-10-07 DIAGNOSIS — M542 Cervicalgia: Secondary | ICD-10-CM | POA: Diagnosis not present

## 2015-10-07 DIAGNOSIS — M4802 Spinal stenosis, cervical region: Secondary | ICD-10-CM | POA: Diagnosis not present

## 2015-10-07 MED ORDER — TIZANIDINE HCL 4 MG PO TABS: 4.0000 mg | ORAL_TABLET | Freq: Three times a day (TID) | ORAL | Status: DC | PRN

## 2015-10-07 NOTE — Assessment & Plan Note (Signed)
I do believe the patient's underlying spinal stenosis is likely contributing to the bilateral pain in his arms as well as the headaches that he is having at this time. Encourage patient to actually have an epidural steroid injection again and this was ordered today. We discussed continuing home exercises, postural control exercises, and patient does have gabapentin for pain relief. Patient given a new prescription for a muscle relaxer that I think will be beneficial. Patient continues to have difficulty we may need to consider changing some of his other medications. Patient otherwise no see can have 3 epidurals within a 12 month period. Patient will return again in 2 weeks after the epidural to discuss further evaluation and treatment.  Spent  25 minutes with patient face-to-face and had greater than 50% of counseling including as described above in assessment and plan.

## 2015-10-07 NOTE — Progress Notes (Signed)
[name] [address] [place], [place] [address] Phone: [phone] Subjective:    I'm seeing this patient by the request  of:  Jaffe MD.   CC: Headache and neck pain.   [id number] [name] [name] is a 69 y.o. male coming in with complaint of headache and neck pain. Patient has seen neurology. Patient is had this pain for greater than one month and may be going on 2 months at this time. Patient states seems to be all day can almost daily. Patient has had a past medical history significant for hypertension. Patient has tried many different medications with no significant improvement. Seem to be exacerbated with certain movements such as keeping his head to the left. States that most of it seems to be in the right suboccipital region but then travels across the head and onto the lateral aspect of the face sometimes. Patient thinks actually back in March while playing baseball as a catcher there is a possibility when he was hit in the left eye that could've started giving him difficulty. States that the next day he passed out. EMS was called and had a CT of the head as well as cervical spine that was fairly unremarkable except for C6-C7 degenerative disc disease. Since then come to have developed headache. Patient continued to have the headache and patient did have an MRI. MRI of the neck were reviewed by me. Patient does have moderate central canal's narrowing at C5-C6 and moderate foraminal stenosis at C6-C7 bilaterally. Patient was initially sent for an epidural steroid injection back in September. Patient states that he is feeling significant a better. Over the course last several weeks he started having increasing in the headaches as well as some mild increase in the weakness. Patient states though that this seems to be worsening over the course of time and is concerned that this will be worse than prior to the epidural. Patient denies any new symptoms. Patient  stopped taking some of the medications including the muscle relaxer. Patient does not know why. Patient is having difficulty even doing daily activity secondary to the discomfort.  Past Medical History  Diagnosis Date  . Diabetes mellitus type II, uncontrolled ([place])   . OSA (obstructive sleep apnea)   . Hyperlipidemia   . RBBB (right bundle branch block)   . Depression     Bipolar  . Edema   . Hypertension   . Falling episodes     these have occurred in the past and again recurring 2011  . Spondylosis     C5-6, C6-7 MRI 2010  . CAD (coronary artery disease)     A. CABG in 2000,status post cardiac cath in 2006, 2009 ....continued chest pain and SOB despite oral medication adjestments including Ranexa. B. Cath November 2009/ mRCA - 2.75 x 23 Abbott Xience V drug-eluting stent ...11/26/2008 to distal  RCA leading to acute marginal.  C. Cath 07/2012 for CP - stable anatomy, med rx. d. cath 2015 and 05/30/2015 stable anatomy, consider Myoview if has CP again  . Hx of CABG     2000,  / one median sternotomy suture broken her chest x-ray November, 2010, no clinical significance  . Palpitations     event recorder showed sinus rhythm  . Nephrolithiasis   . Restless leg   . Dizziness   . Shortness of breath     CPX April, 2011, mild functional limitation, no clear pulmonary or cardiac limitation, possible deconditioning and mild chronotropic  incompetence( peak heart rate 130)  . Cerebral ischemia     MRI November, 2010, chronic microvascular ischemia  . Anemia     hemoglobin 7.4, iron deficiency, January, 2011, 2 unit transfusion, endoscopy normal, capsule endoscopy February, 2011 no small bowel abnormalities.   Most likely source gastric erosions, followed by GI  . H/O medication noncompliance     Due to loss of insurance  . Ejection fraction     EF 60%, echo, July 31, 2012  . Diabetes mellitus without complication ([place])   . Pain in limb 06/12/2009    Qualifier: Diagnosis of  By: [name]   . Low back pain 06/12/2009    Qualifier: Diagnosis of  By: [name]   . Thyroid disease   . Right knee pain 01/07/2015  . Advance care planning 05/01/2015  . ACP (advance care planning) 05/11/2015   Past Surgical History  Procedure Laterality Date  . Nasal septum surgery      UP3  . Coronary artery bypass graft      2000  . Left heart catheterization with coronary/graft angiogram N/A 08/01/2012    Procedure: LEFT HEART CATHETERIZATION WITH Beatrix Fetters;  Surgeon: [name], MD;  Location: [facility] CATH LAB;  Service: Cardiovascular;  Laterality: N/A;  . Left heart catheterization with coronary/graft angiogram N/A 01/03/2015    Procedure: LEFT HEART CATHETERIZATION WITH Beatrix Fetters;  Surgeon: [name], MD;  Location[facility] CATH LAB;  Service: Cardiovascular;  Laterality: N/A;  . Percutaneous coronary stent intervention (pci-s)  10/2008    mRCA PCI  2.75 x 23 Abbott Xience V drug-eluting stent   . Cardiac catheterization N/A 05/30/2015    Procedure: Left Heart Cath and Coronary Angiography;  Surgeon: [name], MD;  Location: [facility] CV LAB;  Service: Cardiovascular;  Laterality: N/A;   Social History  Substance Use Topics  . Smoking status: Never Smoker   . Smokeless tobacco: Never Used  . Alcohol Use: No   Allergies  Allergen Reactions  . Morphine Other (See Comments)    hallucinations  . Shellfish-Derived Products Itching   Family History  Problem Relation Age of Onset  . Cancer Brother     pancreatic cancer  . Diabetes Brother     4   . Stroke Brother   . Diabetes Brother   . Cancer Brother   . Diabetes Brother   . Coronary artery disease Brother   . Diabetes Brother   . Hypothyroidism Brother   . Heart attack      Nephew  . Diabetes Mother   . Heart failure Mother   . Heart failure Father   . Irregular heart beat Daughter   . Cancer Maternal Grandmother     unknown        Past medical history, social,  surgical and family history all reviewed in electronic medical record.   Review of Systems: No headache, visual changes, nausea, vomiting, diarrhea, constipation, dizziness, abdominal pain, skin rash, fevers, chills, night sweats, weight loss, swollen lymph nodes, body aches, joint swelling, muscle aches, chest pain, shortness of breath, mood changes.   Objective Blood pressure 138/70, pulse 75, height 5\' 9"  (1.753 m), weight 177 lb (80.287 kg), SpO2 97 %.  General: No apparent distress alert and oriented x3 mood and affect normal, dressed appropriately.  HEENT: Pupils equal, extraocular movements intact  Respiratory: Patient's speak in full sentences and does not appear short of breath  Cardiovascular: No lower extremity  edema, non tender, no erythema  Skin: Warm dry intact with no signs of infection or rash on extremities or on axial skeleton.  Abdomen: Soft nontender  Neuro: Cranial nerves II through XII are intact, neurovascularly intact in all extremities with 2+ DTRs and 2+ pulses.  Lymph: No lymphadenopathy of posterior or anterior cervical chain or axillae bilaterally.  Gait normal with good balance and coordination.  MSK:  Non tender with full range of motion and good stability and symmetric strength and tone of shoulders, elbows, wrist, hip, knee and ankles bilaterally.  Neck: Inspection mild loss of lordosis noted. No palpable stepoffs. positiveSpurling's maneuver bilateral C7 nerve root. Range of motion is lacking the last 10 in all planes. Worse than previous exam.. Grip strength is decreased to 4 out of 5 but symmetric and sensation normal in bilateral hands Strength seems to be one week in the C7-T1 bilaterally No sensory change to C4 to T1 Negative Hoffman sign bilaterally Reflexes normalstill today.    Impression and Recommendations:     This case required medical decision making of moderate complexity.

## 2015-10-07 NOTE — Patient Instructions (Signed)
Good to see you [name] [name] is your friend Muscle relaxer 3 times a day We will get an epidural again.  If it works see me when you need me If not then we may need to change some medicines.

## 2015-10-07 NOTE — Progress Notes (Signed)
Pre visit review using our clinic review tool, if applicable. No additional management support is needed unless otherwise documented below in the visit note. 

## 2015-10-16 ENCOUNTER — Ambulatory Visit
Admission: RE | Admit: 2015-10-16 | Discharge: 2015-10-16 | Disposition: A | Discharge status: Home / Self Care | Payer: Medicare PPO | Source: Ambulatory Visit | Attending: Family Medicine | Admitting: Family Medicine

## 2015-10-16 DIAGNOSIS — M542 Cervicalgia: Secondary | ICD-10-CM

## 2015-10-16 MED ORDER — TRIAMCINOLONE ACETONIDE 40 MG/ML IJ SUSP (RADIOLOGY)
60.0000 mg | Freq: Once | INTRAMUSCULAR | Status: AC
  Administered 2015-10-16: 60 mg via EPIDURAL

## 2015-10-16 MED ORDER — IOHEXOL 300 MG/ML  SOLN
1.0000 mL | Freq: Once | INTRAMUSCULAR | Status: DC | PRN
  Administered 2015-10-16: 1 mL via EPIDURAL

## 2015-10-29 ENCOUNTER — Telehealth: Payer: Self-pay | Admitting: Family Medicine

## 2015-10-29 NOTE — Telephone Encounter (Signed)
No current samples in office, notified pt's wife.

## 2015-10-29 NOTE — Telephone Encounter (Signed)
Caller name: Santiago Glad   Relationship to patient: Spouse   Can be reached: [phone]  Reason for call:  She would like to know if PCP has any sample insulin for pt. She says that he is currently out and they cant afford to pay for any right at the moment.    Please advise further.

## 2015-11-04 ENCOUNTER — Encounter: Payer: Self-pay | Admitting: Interventional Cardiology

## 2015-11-19 ENCOUNTER — Encounter: Payer: Self-pay | Admitting: Neurology

## 2015-11-19 ENCOUNTER — Ambulatory Visit (INDEPENDENT_AMBULATORY_CARE_PROVIDER_SITE_OTHER): Payer: Medicare PPO | Admitting: Neurology

## 2015-11-19 VITALS — BP 124/76 | HR 78 | Wt 177.9 lb

## 2015-11-19 DIAGNOSIS — R208 Other disturbances of skin sensation: Secondary | ICD-10-CM

## 2015-11-19 DIAGNOSIS — R200 Anesthesia of skin: Secondary | ICD-10-CM

## 2015-11-19 DIAGNOSIS — G4486 Cervicogenic headache: Secondary | ICD-10-CM

## 2015-11-19 DIAGNOSIS — M4802 Spinal stenosis, cervical region: Secondary | ICD-10-CM | POA: Diagnosis not present

## 2015-11-19 DIAGNOSIS — R51 Headache: Secondary | ICD-10-CM

## 2015-11-19 MED ORDER — GABAPENTIN 300 MG PO CAPS
ORAL_CAPSULE | ORAL | Status: DC
Start: 2015-11-19 — End: 2016-02-09

## 2015-11-19 MED ORDER — TIZANIDINE HCL 4 MG PO TABS: 4.0000 mg | ORAL_TABLET | Freq: Three times a day (TID) | ORAL | Status: DC | PRN

## 2015-11-19 NOTE — Progress Notes (Signed)
NEUROLOGY FOLLOW UP OFFICE NOTE  NAGEE PREY [id number]  HISTORY OF PRESENT ILLNESS: [name] [name] is a 69 year old right-handed man with type 2 diabetes mellitus, OSA, hyperlipidemia, coronary artery disease, and hypertension who follows up for cervicogenic headache and occipital neuralgia.  He is accompanied by his wife who provides some history.  UPDATE: MRI of the cervical spine showed multilevel spondylsosis with disc protrusion to the right at C3-4, mild to moderate central and moderate left foraminal stenosis at C4-5, moderate central canal narrowing at C5-6. He was referred to Dr. Tamala Julian for OMM and he received two rounds of epidural injections.  He responded well to the first injection but the second injection did not last very long. Headaches are daily Current abortive medication:  Nothing.  Was taking tizanidine but ran out. Antihypertensive medications:  furosemide,   Antidepressant medications:  sertraline 100mg  Anticonvulsant medications:  gabapentin 300mg  in AM and 600mg  in PM Other therapy:  none Other medication:  ASA 325mg , hyoscyamine, inuslin, Imdur, levothyroxine, metformin, novolog, Mirapex, Zocor, Ambien  He also notes intermittent numbness and tingling of his hands.  HISTORY: In March, he was struck several times in the face mask while playing catcher in a baseball game over the course of two games.   He developed headaches following this.  It starts in the right suboccipital region and travels up the head and across.  There is also associated numbness in the back of the head.   It is associated with nausea, photophobia, phonophobia and some blurred vision.    He has worked as a Environmental education officer for 30 years and has been hit in the face mask before.  He also played football in his youth.  He has no prior history of headache.    Past treatment:  Occipital nerve block (improved), topiramate 50mg  (ineffective)  PAST MEDICAL HISTORY: Past Medical History    Diagnosis Date  . Diabetes mellitus type II, uncontrolled ([place])   . OSA (obstructive sleep apnea)   . Hyperlipidemia   . RBBB (right bundle branch block)   . Depression     Bipolar  . Edema   . Hypertension   . Falling episodes     these have occurred in the past and again recurring 2011  . Spondylosis     C5-6, C6-7 MRI 2010  . CAD (coronary artery disease)     A. CABG in 2000,status post cardiac cath in 2006, 2009 ....continued chest pain and SOB despite oral medication adjestments including Ranexa. B. Cath November 2009/ mRCA - 2.75 x 23 Abbott Xience V drug-eluting stent ...11/26/2008 to distal  RCA leading to acute marginal.  C. Cath 07/2012 for CP - stable anatomy, med rx. d. cath 2015 and 05/30/2015 stable anatomy, consider Myoview if has CP again  . Hx of CABG     2000,  / one median sternotomy suture broken her chest x-ray November, 2010, no clinical significance  . Palpitations     event recorder showed sinus rhythm  . Nephrolithiasis   . Restless leg   . Dizziness   . Shortness of breath     CPX April, 2011, mild functional limitation, no clear pulmonary or cardiac limitation, possible deconditioning and mild chronotropic incompetence( peak heart rate 130)  . Cerebral ischemia     MRI November, 2010, chronic microvascular ischemia  . Anemia     hemoglobin 7.4, iron deficiency, January, 2011, 2 unit transfusion, endoscopy normal, capsule endoscopy February, 2011 no small bowel abnormalities.  Most likely source gastric erosions, followed by GI  . H/O medication noncompliance     Due to loss of insurance  . Ejection fraction     EF 60%, echo, July 31, 2012  . Diabetes mellitus without complication ([place])   . Pain in limb 06/12/2009    Qualifier: Diagnosis of  By: [name]   . Low back pain 06/12/2009    Qualifier: Diagnosis of  By: [name]   . Thyroid disease   . Right knee pain 01/07/2015  . Advance care planning 05/01/2015  . ACP (advance care  planning) 05/11/2015    MEDICATIONS: Current Outpatient Prescriptions on File Prior to Visit  Medication Sig Dispense Refill  . aspirin EC 325 MG tablet Take 325 mg by mouth daily.    Marland Kitchen atorvastatin (LIPITOR) 40 MG tablet Take 1 tablet (40 mg total) by mouth daily at 6 PM. 30 tablet 5  . BAYER CONTOUR NEXT TEST test strip     . clotrimazole-betamethasone (LOTRISONE) cream APPLY TO THE AFFECTED TWICE DAILY 30 g 6  . diclofenac sodium (VOLTAREN) 1 % GEL Apply 2 g topically 3 (three) times daily as needed. For pain to affected area 2 Tube 3  . insulin aspart (NOVOLOG) 100 UNIT/ML injection Inject 10-17 Units into the skin 3 (three) times daily with meals. Use 10-17 unit 3 times daily before meals 15 mL 4  . Insulin Glargine (TOUJEO SOLOSTAR) 300 UNIT/ML SOPN Inject 42 Units into the skin at bedtime. 3 pen 4  . isosorbide mononitrate (IMDUR) 120 MG 24 hr tablet Take 1 tablet (120 mg total) by mouth daily. 90 tablet 1  . levothyroxine (SYNTHROID, LEVOTHROID) 75 MCG tablet Take 1 tablet (75 mcg total) by mouth daily. 90 tablet 2  . metFORMIN (GLUCOPHAGE) 1000 MG tablet Take 1 tablet (1,000 mg total) by mouth 2 (two) times daily with a meal. 60 tablet 5  . nitroGLYCERIN (NITROSTAT) 0.4 MG SL tablet Place 1 tablet (0.4 mg total) under the tongue every 5 (five) minutes as needed (up to 3 doses). 25 tablet 4  . pantoprazole (PROTONIX) 40 MG tablet Take 40 mg by mouth daily.    . pramipexole (MIRAPEX) 0.25 MG tablet Take 1 tablet (0.25 mg total) by mouth at bedtime. 90 tablet 1  . sertraline (ZOLOFT) 100 MG tablet Take 1 tablet (100 mg total) by mouth 2 (two) times daily. 180 tablet 1  . topiramate (TOPAMAX) 50 MG tablet Take 1 tablet (50 mg total) by mouth at bedtime. 30 tablet 3   No current facility-administered medications on file prior to visit.    ALLERGIES: Allergies  Allergen Reactions  . Morphine Other (See Comments)    hallucinations  . Shellfish-Derived Products Itching    FAMILY  HISTORY: Family History  Problem Relation Age of Onset  . Cancer Brother     pancreatic cancer  . Diabetes Brother     4   . Stroke Brother   . Diabetes Brother   . Cancer Brother   . Diabetes Brother   . Coronary artery disease Brother   . Diabetes Brother   . Hypothyroidism Brother   . Heart attack      Nephew  . Diabetes Mother   . Heart failure Mother   . Heart failure Father   . Irregular heart beat Daughter   . Cancer Maternal Grandmother     unknown     SOCIAL HISTORY: Social History   Social History  .  Marital Status: Married    Spouse Name: N/A  . Number of Children: 4  . Years of Education: 13   Occupational History  . retired    Social History Main Topics  . Smoking status: Never Smoker   . Smokeless tobacco: Never Used  . Alcohol Use: No  . Drug Use: No  . Sexual Activity:    Partners: Female   Other Topics Concern  . Not on file   Social History Narrative   Patient is right handed.   Patient does not drink caffeine.    REVIEW OF SYSTEMS: Constitutional: No fevers, chills, or sweats, no generalized fatigue, change in appetite Eyes: No visual changes, double vision, eye pain Ear, nose and throat: No hearing loss, ear pain, nasal congestion, sore throat Cardiovascular: No chest pain, palpitations Respiratory:  No shortness of breath at rest or with exertion, wheezes GastrointestinaI: No nausea, vomiting, diarrhea, abdominal pain, fecal incontinence Genitourinary:  No dysuria, urinary retention or frequency Musculoskeletal:  Neck pain Integumentary: No rash, pruritus, skin lesions Neurological: as above Psychiatric: No depression, insomnia, anxiety Endocrine: No palpitations, fatigue, diaphoresis, mood swings, change in appetite, change in weight, increased thirst Hematologic/Lymphatic:  No anemia, purpura, petechiae. Allergic/Immunologic: no itchy/runny eyes, nasal congestion, recent allergic reactions, rashes  PHYSICAL EXAM: Filed  Vitals:   11/19/15 1356  BP: 124/76  Pulse: 78   General: No acute distress.  Patient appears well-groomed.  normal body habitus. Head:  Normocephalic/atraumatic Eyes:  Fundoscopic exam unremarkable without vessel changes, exudates, hemorrhages or papilledema. Neck: supple, bilateral suboccipital and neck tenderness, full range of motion Heart:  Regular rate and rhythm Lungs:  Clear to auscultation bilaterally Back: No paraspinal tenderness Neurological Exam: alert and oriented to person, place, and time. Attention span and concentration intact, recent and remote memory intact, fund of knowledge intact.  Speech fluent and not dysarthric, language intact.  CN II-XII intact. Fundoscopic exam unremarkable without vessel changes, exudates, hemorrhages or papilledema.  Bulk and tone normal, muscle strength 5/5 throughout.  Sensation to light touch intact.  Deep tendon reflexes 2+ throughout.  Finger to nose and heel to shin testing intact.  Gait normal.  IMPRESSION: Cervicogenic headache Cervical spinal stenosis Bilateral hand numbness and tingling  PLAN: 1.  Will titrate gabapentin to 600mg  in AM, 300mg  at noon and 600mg  at night. 2.  Refilled tizanidine 4mg  as needed for neck pain 3.  Plan is for him to have third round of epidural 4.  Will try wrist splints at night.  If ineffective, consider NCV-EMG 5.  Follow up in 3 months.  15 minutes spent face to face with patient, over 50% spent discussing management.  Metta Clines, DO  CC:  Penni Homans, MD

## 2015-11-19 NOTE — Patient Instructions (Signed)
1.  Increase gabapentin 300mg  capsules to 2 capsules in AM and 2 capsules at night for 7 days, then 2 capsules in AM, 1 capsule at noon, and 2 capsules at night. 2.  Refilled tizanidine (muscle relaxant) 3.  Follow up in  3 months.

## 2015-11-25 ENCOUNTER — Encounter (HOSPITAL_COMMUNITY): Payer: Self-pay | Admitting: *Deleted

## 2015-11-25 ENCOUNTER — Emergency Department (HOSPITAL_COMMUNITY)
Admission: EM | Admit: 2015-11-25 | Discharge: 2015-11-25 | Disposition: A | Discharge status: Home / Self Care | Payer: Medicare PPO | Attending: Emergency Medicine | Admitting: Emergency Medicine

## 2015-11-25 ENCOUNTER — Emergency Department (HOSPITAL_COMMUNITY): Payer: Medicare PPO

## 2015-11-25 DIAGNOSIS — R079 Chest pain, unspecified: Secondary | ICD-10-CM

## 2015-11-25 DIAGNOSIS — E079 Disorder of thyroid, unspecified: Secondary | ICD-10-CM | POA: Diagnosis not present

## 2015-11-25 DIAGNOSIS — Z951 Presence of aortocoronary bypass graft: Secondary | ICD-10-CM | POA: Insufficient documentation

## 2015-11-25 DIAGNOSIS — E119 Type 2 diabetes mellitus without complications: Secondary | ICD-10-CM | POA: Diagnosis not present

## 2015-11-25 DIAGNOSIS — Z79899 Other long term (current) drug therapy: Secondary | ICD-10-CM | POA: Insufficient documentation

## 2015-11-25 DIAGNOSIS — F329 Major depressive disorder, single episode, unspecified: Secondary | ICD-10-CM | POA: Diagnosis not present

## 2015-11-25 DIAGNOSIS — I2510 Atherosclerotic heart disease of native coronary artery without angina pectoris: Secondary | ICD-10-CM | POA: Diagnosis not present

## 2015-11-25 DIAGNOSIS — Z862 Personal history of diseases of the blood and blood-forming organs and certain disorders involving the immune mechanism: Secondary | ICD-10-CM | POA: Insufficient documentation

## 2015-11-25 DIAGNOSIS — R0789 Other chest pain: Secondary | ICD-10-CM

## 2015-11-25 DIAGNOSIS — Z794 Long term (current) use of insulin: Secondary | ICD-10-CM | POA: Insufficient documentation

## 2015-11-25 DIAGNOSIS — Z7982 Long term (current) use of aspirin: Secondary | ICD-10-CM | POA: Diagnosis not present

## 2015-11-25 DIAGNOSIS — I10 Essential (primary) hypertension: Secondary | ICD-10-CM

## 2015-11-25 DIAGNOSIS — E785 Hyperlipidemia, unspecified: Secondary | ICD-10-CM | POA: Diagnosis not present

## 2015-11-25 DIAGNOSIS — Z87442 Personal history of urinary calculi: Secondary | ICD-10-CM | POA: Diagnosis not present

## 2015-11-25 LAB — COMPREHENSIVE METABOLIC PANEL
ALBUMIN: 3.7 g/dL (ref 3.5–5.0)
ALT: 13 U/L — AB (ref 17–63)
AST: 15 U/L (ref 15–41)
Alkaline Phosphatase: 69 U/L (ref 38–126)
Anion gap: 10 (ref 5–15)
BUN: 9 mg/dL (ref 6–20)
CHLORIDE: 109 mmol/L (ref 101–111)
CO2: 23 mmol/L (ref 22–32)
CREATININE: 0.87 mg/dL (ref 0.61–1.24)
Calcium: 8.8 mg/dL — ABNORMAL LOW (ref 8.9–10.3)
GFR calc Af Amer: >60 mL/min (ref >60)
GFR calc non Af Amer: >60 mL/min (ref >60)
Glucose, Bld: 114 mg/dL — ABNORMAL HIGH (ref 65–99)
POTASSIUM: 4.1 mmol/L (ref 3.5–5.1)
Sodium: 142 mmol/L (ref 135–145)
Total Bilirubin: 0.6 mg/dL (ref 0.3–1.2)
Total Protein: 6.3 g/dL — ABNORMAL LOW (ref 6.5–8.1)

## 2015-11-25 LAB — CBC
HEMATOCRIT: 36.8 % — AB (ref 39.0–52.0)
Hemoglobin: 12.7 g/dL — ABNORMAL LOW (ref 13.0–17.0)
MCH: 30.2 pg (ref 26.0–34.0)
MCHC: 34.5 g/dL (ref 30.0–36.0)
MCV: 87.6 fL (ref 78.0–100.0)
PLATELETS: 175 10*3/uL (ref 150–400)
RBC: 4.2 MIL/uL — AB (ref 4.22–5.81)
RDW: 13.7 % (ref 11.5–15.5)
WBC: 6 10*3/uL (ref 4.0–10.5)

## 2015-11-25 LAB — PROTIME-INR
INR: 1.08 (ref 0.00–1.49)
Prothrombin Time: 14.2 seconds (ref 11.6–15.2)

## 2015-11-25 LAB — APTT: APTT: 31 s (ref 24–37)

## 2015-11-25 LAB — TROPONIN I: Troponin I: <0.03 ng/mL (ref <0.031)

## 2015-11-25 MED ORDER — ASPIRIN 81 MG PO CHEW
324.0000 mg | CHEWABLE_TABLET | Freq: Once | ORAL | Status: DC
Start: 2015-11-25 — End: 2015-11-25

## 2015-11-25 MED ORDER — METOPROLOL TARTRATE 25 MG PO TABS: 12.5000 mg | ORAL_TABLET | Freq: Two times a day (BID) | ORAL | Status: DC

## 2015-11-25 MED ORDER — NITROGLYCERIN 0.4 MG SL SUBL: 0.4000 mg | SUBLINGUAL_TABLET | SUBLINGUAL | Status: DC | PRN

## 2015-11-25 NOTE — ED Provider Notes (Addendum)
CSN: [id number]     Arrival date & time 11/25/15  1205 History   First MD Initiated Contact with Patient 11/25/15 1218     Chief Complaint  Patient presents with  . Chest Pain     (Consider location/radiation/quality/duration/timing/severity/associated sxs/prior Treatment) HPI Comments: The patient is a 69 year old male, he has a known history of diabetes, hypertension as well as cardiac disease, he has had a bypass over a decade ago, over the last 2 months he has had a progressive chest pain on exertion, he also has some dizziness and near syncope with exertion. He reports today that he is having a worsening lightheadedness and feeling of near-syncope with chest pain as he was mildly exerting himself, this went away as he rested, he got nitroglycerin by paramedics which also improved his symptoms, a full aspirin, at this time he is symptom-free. He reports having some shortness of breath, some diaphoresis and some radiation to the left shoulder when this occurs.  Patient is a 68 y.o. male presenting with chest pain. The history is provided by the patient.  Chest Pain   Past Medical History  Diagnosis Date  . Diabetes mellitus type II, uncontrolled ([place])   . OSA (obstructive sleep apnea)   . Hyperlipidemia   . RBBB (right bundle branch block)   . Depression     Bipolar  . Edema   . Hypertension   . Falling episodes     these have occurred in the past and again recurring 2011  . Spondylosis     C5-6, C6-7 MRI 2010  . CAD (coronary artery disease)     A. CABG in 2000,status post cardiac cath in 2006, 2009 ....continued chest pain and SOB despite oral medication adjestments including Ranexa. B. Cath November 2009/ mRCA - 2.75 x 23 Abbott Xience V drug-eluting stent ...11/26/2008 to distal  RCA leading to acute marginal.  C. Cath 07/2012 for CP - stable anatomy, med rx. d. cath 2015 and 05/30/2015 stable anatomy, consider Myoview if has CP again  . Hx of CABG     2000,  / one median  sternotomy suture broken her chest x-ray November, 2010, no clinical significance  . Palpitations     event recorder showed sinus rhythm  . Nephrolithiasis   . Restless leg   . Dizziness   . Shortness of breath     CPX April, 2011, mild functional limitation, no clear pulmonary or cardiac limitation, possible deconditioning and mild chronotropic incompetence( peak heart rate 130)  . Cerebral ischemia     MRI November, 2010, chronic microvascular ischemia  . Anemia     hemoglobin 7.4, iron deficiency, January, 2011, 2 unit transfusion, endoscopy normal, capsule endoscopy February, 2011 no small bowel abnormalities.   Most likely source gastric erosions, followed by GI  . H/O medication noncompliance     Due to loss of insurance  . Ejection fraction     EF 60%, echo, July 31, 2012  . Diabetes mellitus without complication ([place])   . Pain in limb 06/12/2009    Qualifier: Diagnosis of  By: [name]   . Low back pain 06/12/2009    Qualifier: Diagnosis of  By: [name]   . Thyroid disease   . Right knee pain 01/07/2015  . Advance care planning 05/01/2015  . ACP (advance care planning) 05/11/2015   Past Surgical History  Procedure Laterality Date  . Nasal septum surgery      UP3  . Coronary  artery bypass graft      2000  . Left heart catheterization with coronary/graft angiogram N/A 08/01/2012    Procedure: LEFT HEART CATHETERIZATION WITH Beatrix Fetters;  Surgeon: [name], MD;  Location: [facility] CATH LAB;  Service: Cardiovascular;  Laterality: N/A;  . Left heart catheterization with coronary/graft angiogram N/A 01/03/2015    Procedure: LEFT HEART CATHETERIZATION WITH Beatrix Fetters;  Surgeon: [name], MD;  Location: [facility] CATH LAB;  Service: Cardiovascular;  Laterality: N/A;  . Percutaneous coronary stent intervention (pci-s)  10/2008    mRCA PCI  2.75 x 23 Abbott Xience V drug-eluting stent   . Cardiac catheterization N/A 05/30/2015    Procedure:  Left Heart Cath and Coronary Angiography;  Surgeon: [name], MD;  Location: [place] CV LAB;  Service: Cardiovascular;  Laterality: N/A;   Family History  Problem Relation Age of Onset  . Cancer Brother     pancreatic cancer  . Diabetes Brother     4   . Stroke Brother   . Diabetes Brother   . Cancer Brother   . Diabetes Brother   . Coronary artery disease Brother   . Diabetes Brother   . Hypothyroidism Brother   . Heart attack      Nephew  . Diabetes Mother   . Heart failure Mother   . Heart failure Father   . Irregular heart beat Daughter   . Cancer Maternal Grandmother     unknown    Social History  Substance Use Topics  . Smoking status: Never Smoker   . Smokeless tobacco: Never Used  . Alcohol Use: No    Review of Systems  Cardiovascular: Positive for chest pain.  All other systems reviewed and are negative.     Allergies  Morphine and Shellfish-derived products  Home Medications   Prior to Admission medications   Medication Sig Start Date End Date Taking? Authorizing Provider  aspirin EC 325 MG tablet Take 325 mg by mouth at bedtime.    Yes Historical Provider, MD  atorvastatin (LIPITOR) 40 MG tablet Take 1 tablet (40 mg total) by mouth daily at 6 PM. 05/30/15  Yes Almyra Deforest, PA  clotrimazole-betamethasone (LOTRISONE) cream APPLY TO THE AFFECTED TWICE DAILY Patient taking differently: Apply 1 application topically 2 (two) times daily as needed (affected area).  05/01/15  Yes [name], MD  diclofenac sodium (VOLTAREN) 1 % GEL Apply 2 g topically 3 (three) times daily as needed. For pain to affected area 05/01/15  Yes [name], MD  gabapentin (NEURONTIN) 300 MG capsule 2 capsules in AM, 1 capsule at noon, 2 capsules in PM daily Patient taking differently: Take 300-600 mg by mouth 3 (three) times daily. 2 capsules in AM, 1 capsule at noon, 2 capsules in PM daily 11/19/15  Yes Pieter Partridge, DO  insulin aspart (NOVOLOG) 100 UNIT/ML injection  Inject 10-17 Units into the skin 3 (three) times daily with meals. Use 10-17 unit 3 times daily before meals Patient taking differently: Inject 10-17 Units into the skin 3 (three) times daily with meals. Use 10-17 units on a sliding scale. 17 units = 250 and above 09/16/15  Yes [name], MD  insulin glargine (LANTUS) 100 UNIT/ML injection Inject 42 Units into the skin at bedtime.   Yes Historical Provider, MD  isosorbide mononitrate (IMDUR) 120 MG 24 hr tablet Take 1 tablet (120 mg total) by mouth daily. 05/08/13  Yes Doe-Hyun Kyra Searles, DO  levothyroxine (SYNTHROID, LEVOTHROID)  75 MCG tablet Take 1 tablet (75 mcg total) by mouth daily. 05/01/15  Yes [name], MD  metFORMIN (GLUCOPHAGE) 1000 MG tablet Take 1 tablet (1,000 mg total) by mouth 2 (two) times daily with a meal. 06/12/15  Yes [name], MD  nitroGLYCERIN (NITROSTAT) 0.4 MG SL tablet Place 1 tablet (0.4 mg total) under the tongue every 5 (five) minutes as needed (up to 3 doses). 05/08/13  Yes Doe-Hyun R Shawna Orleans, DO  pantoprazole (PROTONIX) 40 MG tablet Take 40 mg by mouth daily.   Yes Historical Provider, MD  pramipexole (MIRAPEX) 0.25 MG tablet Take 1 tablet (0.25 mg total) by mouth at bedtime. 05/08/13  Yes Doe-Hyun R Shawna Orleans, DO  sertraline (ZOLOFT) 100 MG tablet Take 1 tablet (100 mg total) by mouth 2 (two) times daily. 07/24/15  Yes [name], MD  tiZANidine (ZANAFLEX) 4 MG tablet Take 1 tablet (4 mg total) by mouth every 8 (eight) hours as needed for muscle spasms. 11/19/15  Yes Adam Telford Nab, DO  topiramate (TOPAMAX) 50 MG tablet Take 1 tablet (50 mg total) by mouth at bedtime. 04/25/15  Yes Adam Telford Nab, DO  Insulin Glargine (TOUJEO SOLOSTAR) 300 UNIT/ML SOPN Inject 42 Units into the skin at bedtime. Patient not taking: Reported on 11/25/2015 09/16/15   [name], MD  metoprolol (LOPRESSOR) 25 MG tablet Take 0.5 tablets (12.5 mg total) by mouth 2 (two) times daily. 11/25/15   Noemi Chapel, MD   BP 135/77 mmHg  Pulse 73   Temp(Src) 97.7 F (36.5 C) (Oral)  Resp 17  SpO2 99% Physical Exam  Constitutional: He appears well-developed and well-nourished. No distress.  HENT:  Head: Normocephalic and atraumatic.  Mouth/Throat: Oropharynx is clear and moist. No oropharyngeal exudate.  Eyes: Conjunctivae and EOM are normal. Pupils are equal, round, and reactive to light. Right eye exhibits no discharge. Left eye exhibits no discharge. No scleral icterus.  Neck: Normal range of motion. Neck supple. No JVD present. No thyromegaly present.  Cardiovascular: Normal rate, regular rhythm, normal heart sounds and intact distal pulses.  Exam reveals no gallop and no friction rub.   No murmur heard. Pulmonary/Chest: Effort normal and breath sounds normal. No respiratory distress. He has no wheezes. He has no rales.  Abdominal: Soft. Bowel sounds are normal. He exhibits no distension and no mass. There is no tenderness.  Musculoskeletal: Normal range of motion. He exhibits no edema or tenderness.  Lymphadenopathy:    He has no cervical adenopathy.  Neurological: He is alert. Coordination normal.  Skin: Skin is warm and dry. No rash noted. No erythema.  Psychiatric: He has a normal mood and affect. His behavior is normal.  Nursing note and vitals reviewed.   ED Course  Procedures (including critical care time) Labs Review Labs Reviewed  CBC - Abnormal; Notable for the following:    RBC 4.20 (*)    Hemoglobin 12.7 (*)    HCT 36.8 (*)    All other components within normal limits  COMPREHENSIVE METABOLIC PANEL - Abnormal; Notable for the following:    Glucose, Bld 114 (*)    Calcium 8.8 (*)    Total Protein 6.3 (*)    ALT 13 (*)    All other components within normal limits  APTT  PROTIME-INR  TROPONIN I    Imaging Review Dg Chest Portable 1 View  11/25/2015  CLINICAL DATA:  Chest pain EXAM: PORTABLE CHEST 1 VIEW COMPARISON:  09/15/2015 FINDINGS: CABG changes. Negative for heart  failure. Lungs are clear without  infiltrate or effusion. No change from the prior study. IMPRESSION: No active disease. Electronically Signed   By: Franchot Gallo [name]   On: 11/25/2015 12:59   I have personally reviewed and evaluated these images and lab results as part of my medical decision-making.   EKG Interpretation   Date/Time:  Tuesday November 25 2015 12:14:29 EST Ventricular Rate:  71 PR Interval:  185 QRS Duration: 123 QT Interval:  414 QTC Calculation: 450 R Axis:   42 Text Interpretation:  Sinus rhythm Right bundle branch block since last  tracing no significant change Confirmed by Shekia Kuper  MD, Kitiara Hintze ([id number]) on  11/25/2015 12:33:00 PM      MDM   Final diagnoses:  Chest pain, unspecified chest pain type    Workup with labs, chest x-ray, repeat EKG, at this time the patient has classic unstable angina, anticipate admission to the cardiology service.  Meds given in ED:  Medications  aspirin chewable tablet 324 mg (324 mg Oral Not Given 11/25/15 1253)  nitroGLYCERIN (NITROSTAT) SL tablet 0.4 mg (not administered)    New Prescriptions   METOPROLOL (LOPRESSOR) 25 MG TABLET    Take 0.5 tablets (12.5 mg total) by mouth 2 (two) times daily.     Cath from 8/16 The patient has coronary disease and he is post CABG in the past. His cath on May 29, 2015 revealed no new lesions to explain angina. There was a patent DES to the mid RCA. The LIMA was patent to a small downstream distal LAD with minimal LAD disease. There were patent vein grafts to D1 and D2 and RPAV. There was normal left ventricular function. There was sluggish flow down the grafts to the diagonals secondary to hypoperfusion following radial cocktail administered. Medical therapy is recommended. It was felt that nuclear stress testing could be used in the future to help assess his status.  Trish updated on pt status - cards to consult and admit.  The cardiology service has seen pt - Dr. Marlou Porch wants pt to be d/c and they will set up appt in  the office - will start metoprolol 12.5 bid at their request.  Labs and ECG normal  VS normal   Noemi Chapel, MD 11/25/15 Robinson, MD 11/25/15 1506  Noemi Chapel, MD 11/25/15 [phone]

## 2015-11-25 NOTE — Discharge Instructions (Signed)
The cardiologist have seen you and have recommended that we send you home with a new prescription for metoprolol to take twice daily - please follow up at the time above - return to the ER for severe or worsening symptoms.  Please obtain all of your results from medical records or have your doctors office obtain the results - share them with your doctor - you should be seen at your doctors office in the next 2 days. Call today to arrange your follow up. Take the medications as prescribed. Please review all of the medicines and only take them if you do not have an allergy to them. Please be aware that if you are taking birth control pills, taking other prescriptions, ESPECIALLY ANTIBIOTICS may make the birth control ineffective - if this is the case, either do not engage in sexual activity or use alternative methods of birth control such as condoms until you have finished the medicine and your family doctor says it is OK to restart them. If you are on a blood thinner such as COUMADIN, be aware that any other medicine that you take may cause the coumadin to either work too much, or not enough - you should have your coumadin level rechecked in next 7 days if this is the case.  ?  It is also a possibility that you have an allergic reaction to any of the medicines that you have been prescribed - Everybody reacts differently to medications and while MOST people have no trouble with most medicines, you may have a reaction such as nausea, vomiting, rash, swelling, shortness of breath. If this is the case, please stop taking the medicine immediately and contact your physician.  ?  You should return to the ER if you develop severe or worsening symptoms.

## 2015-11-25 NOTE — ED Notes (Signed)
Pt states he has had intermittent chest pain x 2 months with worsening this AM.  Pt experienced Left sided chest pain that radiated to bi lateral shoulders with SOB, lightheaded and had to lower himself to the floor. Denies LOC Pt took 1 nitro with relief from 10/10 to 5/10.  GEMS :324 asp, 1 nitro with relief 1/10., 134/80, 68, 20, 97%  20 G L hand.

## 2015-11-25 NOTE — Consult Note (Signed)
Patient ID: [name] [name] MRN: [id number] DOB/AGE: 14-May-1946 69 y.o.  Admit date: 11/25/2015  Primary Physician   Penni Homans, MD Primary Cardiologist   [name] (previously Dr. [name]) Reason for Consultation   Chest pain  HPI: [name] [name] is a 69 y.o. male with a history of CAD s/p CABG 2000, htn, hyperlipidemia, OSA, RBBB, diabetes who came to [facility] ED for evaluation of ongoing chest pain for the past 3-4 months.  Hx of CABG (LIMA to LAD, SVG to OM, SAVG to D1, SVG to PLA). Had multiple PCI since that time in 2006, 2009, 2013 and 2016. Last cath 05/30/2015 showed severe native disease of the LAD (occluded D1, as well as occluded stents in D2 and LAD.) as well as occluded RPAV. Patent RCA stents & Grafts x 4. Relatively stable findings of angiography. There was sluggish flow down the grafts to the diagonal branches secondary to hypotension following radial cocktail administered. Medical therapy is recommended. It was recommended that in the future, we can attempt noninvasive stress testing as he is unlikely to have false negative stress test given his current anatomy.  He continued to have ongoing chest pain while last seen by Dr. [name] 08/04/2015 and recommended medical therapy. Recently evaluated by neurology for cervicogenic headache and occipital neuralgia.MRI of the cervical spine showed multilevel spondylsosis with disc protrusion to the right at C3-4, mild to moderate central and moderate left foraminal stenosis at C4-5, moderate central canal narrowing at C5-6. Received two rounds of epidural injections. Titrated titrate gabapentin to 600mg  in AM, 300mg  at noon and 600mg  at night.  This morning patient developed with dizziness while standing. He set down and then he developed head left upper chest pain that he described as a sharp and radiated to his back. He took sublingual nitroglycerin x 1 with minimal improvement. EMS was called and given another  nitroglycerin with resolution of symptoms. Intermittently patient has been having more exertional chest pain that resolved with rest. Occasionally pain occurs with rest as well as and resolved within 5-10 minutes. He also admits to having intermittent shortness of breath.  In ED patient is chest pain-free. EKG sinus rhythm with a chronic right bundle branch block. No ischemic change. Troponin I negative. Lytes normal. Chest x-ray clear.  Last echocardiogram 07/2012 shows left ventricular function of 60-65% with grade 1DD. Recently patient is recovering from cough/bronchitis.   Past Medical History  Diagnosis Date  . Diabetes mellitus type II, uncontrolled ([place])   . OSA (obstructive sleep apnea)   . Hyperlipidemia   . RBBB (right bundle branch block)   . Depression     Bipolar  . Edema   . Hypertension   . Falling episodes     these have occurred in the past and again recurring 2011  . Spondylosis     C5-6, C6-7 MRI 2010  . CAD (coronary artery disease)     A. CABG in 2000,status post cardiac cath in 2006, 2009 ....continued chest pain and SOB despite oral medication adjestments including Ranexa. B. Cath November 2009/ mRCA - 2.75 x 23 Abbott Xience V drug-eluting stent ...11/26/2008 to distal  RCA leading to acute marginal.  C. Cath 07/2012 for CP - stable anatomy, med rx. d. cath 2015 and 05/30/2015 stable anatomy, consider Myoview if has CP again  . Hx of CABG     2000,  / one median sternotomy suture broken her chest x-ray November, 2010, no clinical significance  .  Palpitations     event recorder showed sinus rhythm  . Nephrolithiasis   . Restless leg   . Dizziness   . Shortness of breath     CPX April, 2011, mild functional limitation, no clear pulmonary or cardiac limitation, possible deconditioning and mild chronotropic incompetence( peak heart rate 130)  . Cerebral ischemia     MRI November, 2010, chronic microvascular ischemia  . Anemia     hemoglobin 7.4, iron deficiency,  January, 2011, 2 unit transfusion, endoscopy normal, capsule endoscopy February, 2011 no small bowel abnormalities.   Most likely source gastric erosions, followed by GI  . H/O medication noncompliance     Due to loss of insurance  . Ejection fraction     EF 60%, echo, July 31, 2012  . Diabetes mellitus without complication ([place])   . Pain in limb 06/12/2009    Qualifier: Diagnosis of  By: [name]   . Low back pain 06/12/2009    Qualifier: Diagnosis of  By: [name]   . Thyroid disease   . Right knee pain 01/07/2015  . Advance care planning 05/01/2015  . ACP (advance care planning) 05/11/2015     Past Surgical History  Procedure Laterality Date  . Nasal septum surgery      UP3  . Coronary artery bypass graft      2000  . Left heart catheterization with coronary/graft angiogram N/A 08/01/2012    Procedure: LEFT HEART CATHETERIZATION WITH Beatrix Fetters;  Surgeon: [name], MD;  Location: [facility] CATH LAB;  Service: Cardiovascular;  Laterality: N/A;  . Left heart catheterization with coronary/graft angiogram N/A 01/03/2015    Procedure: LEFT HEART CATHETERIZATION WITH Beatrix Fetters;  Surgeon: [name], MD;  Location: [facility] CATH LAB;  Service: Cardiovascular;  Laterality: N/A;  . Percutaneous coronary stent intervention (pci-s)  10/2008    mRCA PCI  2.75 x 23 Abbott Xience V drug-eluting stent   . Cardiac catheterization N/A 05/30/2015    Procedure: Left Heart Cath and Coronary Angiography;  Surgeon: [name], MD;  Location: [place] CV LAB;  Service: Cardiovascular;  Laterality: N/A;    Allergies  Allergen Reactions  . Morphine Other (See Comments)    hallucinations  . Shellfish-Derived Products Itching    I have reviewed the patient's current medications . aspirin  324 mg Oral Once     nitroGLYCERIN  Prior to Admission medications   Medication Sig Start Date End Date Taking? Authorizing Provider  aspirin EC 325 MG tablet  Take 325 mg by mouth daily.    Historical Provider, MD  atorvastatin (LIPITOR) 40 MG tablet Take 1 tablet (40 mg total) by mouth daily at 6 PM. 05/30/15   Almyra Deforest, PA  BAYER CONTOUR NEXT TEST test strip  07/09/15   Historical Provider, MD  clotrimazole-betamethasone (LOTRISONE) cream APPLY TO THE AFFECTED TWICE DAILY 05/01/15   [name], MD  diclofenac sodium (VOLTAREN) 1 % GEL Apply 2 g topically 3 (three) times daily as needed. For pain to affected area 05/01/15   [name], MD  gabapentin (NEURONTIN) 300 MG capsule 2 capsules in AM, 1 capsule at noon, 2 capsules in PM daily 11/19/15   Pieter Partridge, DO  insulin aspart (NOVOLOG) 100 UNIT/ML injection Inject 10-17 Units into the skin 3 (three) times daily with meals. Use 10-17 unit 3 times daily before meals 09/16/15   [name], MD  Insulin Glargine (TOUJEO SOLOSTAR) 300 UNIT/ML SOPN  Inject 42 Units into the skin at bedtime. 09/16/15   [name], MD  isosorbide mononitrate (IMDUR) 120 MG 24 hr tablet Take 1 tablet (120 mg total) by mouth daily. 05/08/13   Doe-Hyun R Shawna Orleans, DO  levothyroxine (SYNTHROID, LEVOTHROID) 75 MCG tablet Take 1 tablet (75 mcg total) by mouth daily. 05/01/15   [name], MD  metFORMIN (GLUCOPHAGE) 1000 MG tablet Take 1 tablet (1,000 mg total) by mouth 2 (two) times daily with a meal. 06/12/15   [name], MD  nitroGLYCERIN (NITROSTAT) 0.4 MG SL tablet Place 1 tablet (0.4 mg total) under the tongue every 5 (five) minutes as needed (up to 3 doses). 05/08/13   Doe-Hyun R Shawna Orleans, DO  pantoprazole (PROTONIX) 40 MG tablet Take 40 mg by mouth daily.    Historical Provider, MD  pramipexole (MIRAPEX) 0.25 MG tablet Take 1 tablet (0.25 mg total) by mouth at bedtime. 05/08/13   Doe-Hyun R Shawna Orleans, DO  sertraline (ZOLOFT) 100 MG tablet Take 1 tablet (100 mg total) by mouth 2 (two) times daily. 07/24/15   [name], MD  tiZANidine (ZANAFLEX) 4 MG tablet Take 1 tablet (4 mg total) by mouth every 8 (eight) hours as needed for  muscle spasms. 11/19/15   Pieter Partridge, DO  topiramate (TOPAMAX) 50 MG tablet Take 1 tablet (50 mg total) by mouth at bedtime. 04/25/15   Pieter Partridge, DO     Social History   Social History  . Marital Status: Married    Spouse Name: N/A  . Number of Children: 4  . Years of Education: 13   Occupational History  . retired    Social History Main Topics  . Smoking status: Never Smoker   . Smokeless tobacco: Never Used  . Alcohol Use: No  . Drug Use: No  . Sexual Activity:    Partners: Female   Other Topics Concern  . Not on file   Social History Narrative   Patient is right handed.   Patient does not drink caffeine.    Family Status  Relation Status Death Age  . Brother Deceased   . Brother Alive   . Brother Alive   . Mother Deceased     Died during CABG.    . Father Deceased     unknown arrhythmia   Family History  Problem Relation Age of Onset  . Cancer Brother     pancreatic cancer  . Diabetes Brother     4   . Stroke Brother   . Diabetes Brother   . Cancer Brother   . Diabetes Brother   . Coronary artery disease Brother   . Diabetes Brother   . Hypothyroidism Brother   . Heart attack      Nephew  . Diabetes Mother   . Heart failure Mother   . Heart failure Father   . Irregular heart beat Daughter   . Cancer Maternal Grandmother     unknown        ROS:  Full 14 point review of systems complete and found to be negative unless listed above.  Physical Exam: Blood pressure 130/76, pulse 64, temperature 97.7 F (36.5 C), temperature source Oral, resp. rate 17, SpO2 95 %.  General: Well developed, well nourished, male in no acute distress Head: Eyes PERRLA, No xanthomas. Normocephalic and atraumatic, oropharynx without edema or exudate.  Lungs: Resp regular and unlabored, CTA. TTP at left upper chest.  Heart: RRR no s3, s4, or murmurs.Marland Kitchen  Neck: No carotid bruits. No lymphadenopathy.  No JVD. Abdomen: Bowel sounds present, abdomen soft and non-tender  without masses or hernias noted. Msk:  No spine or cva tenderness. No weakness, no joint deformities or effusions. Extremities: No clubbing, cyanosis or edema. DP/PT/Radials 2+ and equal bilaterally. Neuro: Alert and oriented X 3. No focal deficits noted. Psych:  Good affect, responds appropriately Skin: No rashes or lesions noted.  Labs:   Lab Results  Component Value Date   WBC 6.0 11/25/2015   HGB 12.7* 11/25/2015   HCT 36.8* 11/25/2015   MCV 87.6 11/25/2015   PLT 175 11/25/2015    Recent Labs  11/25/15 1259  INR 1.08    Recent Labs Lab 11/25/15 1259  NA 142  K 4.1  CL 109  CO2 23  BUN 9  CREATININE 0.87  CALCIUM 8.8*  PROT 6.3*  BILITOT 0.6  ALKPHOS 69  ALT 13*  AST 15  GLUCOSE 114*  ALBUMIN 3.7    Recent Labs  11/25/15 1259  TROPONINI <0.03   Echo: 07/31/2012 LV EF: 60% -  65%  ------------------------------------------------------------ Indications:   Chest pain 786.51.  ------------------------------------------------------------ History:  PMH:  Coronary artery disease. Risk factors: Hypothyroidism. Depression. Restless leg syndrome. Vertigo. RBBB. Sciatica. Bilateral leg pain. Dizziness. H/o colon adenoma. H/o nephrolithiasis. Fatigue. Insomnia. Anemia. OSA. Edema. Falling episodes. Palpitations. Hypertension. Diabetes mellitus. Dyslipidemia.  ------------------------------------------------------------ Study Conclusions  Left ventricle: The cavity size was normal. Wall thickness was normal. Systolic function was normal. The estimated ejection fraction was in the range of 60% to 65%. Wall motion was normal; there were no regional wall motion abnormalities. Doppler parameters are consistent with abnormal left ventricular relaxation (grade 1 diastolic dysfunction).   ECG:   Confirmed by MILLER MD, [place] ([id number]) on 11/25/2015 12:33:00 PM 1mm/s 19mm/mV 100Hz  8.0 SP2 CID: [id number] Referred by: Confirmed By: Noemi Chapel MD Vent. rate  71 BPM PR interval 185 ms QRS duration 123 ms QT/QTc 414/450 ms P-R-T axes 0 42 43  Cardiac catheterization 05/30/2015 Conclusion    1. Prox LAD to Mid LAD lesion, 90% stenosed. Mid LAD to Dist LAD lesion, 100% stenosed within a bare metal stent (from Pre-CABG). 2. Ost 2nd Diag to 2nd Diag lesion, 100% stenosed within a BMS (pre-CABG) 3. Post Atrio lesion, 100% stenosed. 4. Widely patent DES stent to the mid RCA just prior to the bifurcation into RPDA and RPAV 5. Widely patent LIMA to a Small downstream distal LAD - minimal CAD 6. Widely patent saphenous vein grafts to the D1, D2 and RPAV 7. The left ventricular systolic function is normal. 8. No new lesion to explain unstable angina   Severe native disease of the LAD (occluded D1, as well as occluded stents in D2 and LAD.) as well as occluded RPAV. Patent RCA stents & Grafts x 4.  Relatively stable findings of angiography. There was sluggish flow down the grafts to the diagonal branches secondary to hypotension following radial cocktail administered.  Recommendations:  Standard post radial cath TR been removal  Anticipate discharge later on today with increased medical management.  In the future, I think that we could consider noninvasive stress testing as he is unlikely to have a false negative stress test given his current anatomy.           Radiology:  Dg Chest Portable 1 View  11/25/2015  CLINICAL DATA:  Chest pain EXAM: PORTABLE CHEST 1 VIEW COMPARISON:  09/15/2015 FINDINGS: CABG changes. Negative for heart failure. Lungs are clear without infiltrate  or effusion. No change from the prior study. IMPRESSION: No active disease. Electronically Signed   By: Franchot Gallo [name]   On: 11/25/2015 12:59    ASSESSMENT AND PLAN:     1. Chest pain - His pain has both typical and atypical features. His chest pain is reproducible with palpation on exam and recently had bronchitis. - His pain is also concerning for unstable  angina which is nitroglycerin responsive. Pain is somewhat different from his prior cardiac pain. However, state that he never had any pain during his first PCI. - EKG nonischemic. Point-of-care troponin negative.  2. CAD - Hx of CABG (LIMA to LAD, SVG to OM, SAVG to D1, SVG to PLA). Had multiple PCI since that time in 2006, 2009, 2013 and 2016. - Last cath 05/30/2015 showed severe native disease of the LAD (occluded D1, as well as occluded stents in D2 and LAD.) as well as occluded RPAV. Patent RCA stents & Grafts x 4. Relatively stable findings of angiography. - Also recommended noninvasive stress testing in future as he is unlikely to have a false negative stress test given his current anatomy.  3. Hypertension - Stable and well controlled.  4. HL - 05/01/2015: Cholesterol 162; HDL 33.90*; LDL Cholesterol 107*; Triglycerides 107.0; VLDL 21.4  - Continue statin  Dispo: will start metoprolol 12.5mg  BID and f/u in clinic. Ok to discharge.   SignedLeanor Kail, [place] 11/25/2015, 3:17 PM Pager [phone]  Co-Sign MD Personally seen and examined. Agree with above.  Chest pain has fairly atypical quality to it, reproducible to palpation. Minor cough. Extensive coronary history however. Explained in detail how stress test works. I do not think at this point that he needs a stress test. I think it would be best to continue with anti-anginal medications. We have started low-dose metoprolol 12.5 mg twice a day.  Affordability is a concern of his, he was not able to afford Ranexa.  Continue isosorbide.  Okay to decrease aspirin to 81 mg.  Okay to discharge from emergency room.  He is comfortable with this disposition.  Candee Furbish, MD

## 2015-12-09 MED FILL — GABAPENTIN 300 MG CAPSULE: 300 | 30 days supply | Qty: 90 | Fill #1

## 2015-12-09 MED FILL — SERTRALINE HCL 100 MG TAB: 100 | 90 days supply | Qty: 180 | Fill #0

## 2015-12-09 MED FILL — ATORVASTATIN 40 MG TABLET: 40 | 30 days supply | Qty: 30 | Fill #2

## 2015-12-09 MED FILL — TOPIRAMATE 50 MG TABLET: 50 | 30 days supply | Qty: 30 | Fill #3

## 2015-12-09 MED FILL — RANEXA ER 500 MG TABLET: 500 | 30 days supply | Qty: 60 | Fill #1

## 2015-12-09 MED FILL — PANTOPRAZOLE SOD DR 40 MG T: 40 | 30 days supply | Qty: 45 | Fill #2

## 2015-12-09 MED FILL — metFORMIN HCL 1000 MG TABS: 1000 | 30 days supply | Qty: 60 | Fill #2

## 2015-12-09 MED FILL — tiZANidine HCL 4 MG TABS: 4 | 30 days supply | Qty: 30 | Fill #1

## 2015-12-19 ENCOUNTER — Telehealth: Payer: Self-pay

## 2015-12-19 MED ORDER — ISOSORBIDE MONONITRATE ER 120 MG PO TB24: 120.0000 mg | ORAL_TABLET | Freq: Every day | ORAL | Status: DC

## 2015-12-19 MED ORDER — PRAMIPEXOLE DIHYDROCHLORIDE 0.5 MG PO TABS: 0.5000 mg | ORAL_TABLET | Freq: Every day | ORAL | Status: DC

## 2015-12-19 NOTE — Telephone Encounter (Signed)
Pt in today with wife asked for increase on Mirapex, and also asked for refill on isosorbide spoke with dr. Charlett Blake she refilled medication until seen by cardiologist and increased mirapex needs an apt 6-8 weeks

## 2015-12-23 ENCOUNTER — Encounter: Payer: Commercial Managed Care - HMO | Admitting: Nurse Practitioner

## 2016-01-08 MED FILL — PRAMIPEXOLE 1 MG TABLET: 1 | 30 days supply | Qty: 30 | Fill #0

## 2016-01-09 MED FILL — RANEXA ER 500 MG TABLET: 500 | 30 days supply | Qty: 60 | Fill #2

## 2016-01-09 MED FILL — tiZANidine HCL 4 MG TABS: 4 | 30 days supply | Qty: 30 | Fill #2

## 2016-01-09 MED FILL — metFORMIN HCL 1000 MG TABS: 1000 | 30 days supply | Qty: 60 | Fill #3

## 2016-01-09 MED FILL — ISOSORBIDE MN ER 60 MG TAB: 60 | 30 days supply | Qty: 60 | Fill #0

## 2016-01-09 MED FILL — ATORVASTATIN 40 MG TABLET: 40 | 30 days supply | Qty: 30 | Fill #3

## 2016-01-14 ENCOUNTER — Telehealth: Payer: Self-pay | Admitting: Family Medicine

## 2016-01-14 NOTE — Telephone Encounter (Signed)
Patient Name: [name] [name] DOB: Nov 09, 1946 Initial Comment caller states he cannot eat - he is having vomiting, c/o a headache (heads hurts all over) body aches Nurse Assessment Nurse: Marcelline Deist, RN, Kermit Balo Date/Time (Eastern Time): 01/14/2016 11:01:36 AM Confirm and document reason for call. If symptomatic, describe symptoms. You must click the next button to save text entered. ---Caller states he cannot eat. He is having vomiting, c/o a headache (heads hurts all over) as well as body aches. His left hand starts shaking all of sudden. No fever. Symptoms started Friday, worsened over the weekend. Has the patient traveled out of the country within the last 30 days? ---Not Applicable Does the patient have any new or worsening symptoms? ---Yes Will a triage be completed? ---Yes Related visit to physician within the last 2 weeks? ---No Does the PT have any chronic conditions? (i.e. diabetes, asthma, etc.) ---Yes List chronic conditions. ---bypass 17 years ago, restless leg syndrome, diabetes - on insulin & Metformin Is this a behavioral health or substance abuse call? ---No Guidelines Guideline Title Affirmed Question Affirmed Notes Headache [1] Numbness of the face, arm or leg on one side of the body AND [2] new onset Final Disposition User Call EMS 911 Now Marcelline Deist, RN, Lynda Disagree/Comply: Leta Baptist

## 2016-01-15 ENCOUNTER — Encounter: Payer: Self-pay | Admitting: Neurology

## 2016-01-15 ENCOUNTER — Ambulatory Visit
Admission: RE | Admit: 2016-01-15 | Discharge: 2016-01-15 | Disposition: A | Discharge status: Home / Self Care | Payer: Commercial Managed Care - HMO | Source: Ambulatory Visit | Attending: Neurology | Admitting: Neurology

## 2016-01-15 ENCOUNTER — Ambulatory Visit (INDEPENDENT_AMBULATORY_CARE_PROVIDER_SITE_OTHER): Payer: Commercial Managed Care - HMO | Admitting: Neurology

## 2016-01-15 ENCOUNTER — Other Ambulatory Visit (INDEPENDENT_AMBULATORY_CARE_PROVIDER_SITE_OTHER): Payer: Commercial Managed Care - HMO

## 2016-01-15 VITALS — BP 124/76 | HR 70 | Ht 69.0 in | Wt 174.0 lb

## 2016-01-15 DIAGNOSIS — R413 Other amnesia: Secondary | ICD-10-CM | POA: Diagnosis not present

## 2016-01-15 DIAGNOSIS — F05 Delirium due to known physiological condition: Secondary | ICD-10-CM

## 2016-01-15 DIAGNOSIS — R410 Disorientation, unspecified: Secondary | ICD-10-CM

## 2016-01-15 LAB — CBC WITH DIFFERENTIAL/PLATELET
Basophils Absolute: 0 10*3/uL (ref 0.0–0.1)
Basophils Relative: 0.2 % (ref 0.0–3.0)
EOS PCT: 3.3 % (ref 0.0–5.0)
Eosinophils Absolute: 0.3 10*3/uL (ref 0.0–0.7)
HCT: 41.7 % (ref 39.0–52.0)
HEMOGLOBIN: 14.2 g/dL (ref 13.0–17.0)
Lymphocytes Relative: 30.6 % (ref 12.0–46.0)
Lymphs Abs: 2.6 10*3/uL (ref 0.7–4.0)
MCHC: 34.1 g/dL (ref 30.0–36.0)
MCV: 90.5 fl (ref 78.0–100.0)
MONOS PCT: 7.1 % (ref 3.0–12.0)
Monocytes Absolute: 0.6 10*3/uL (ref 0.1–1.0)
Neutro Abs: 5 10*3/uL (ref 1.4–7.7)
Neutrophils Relative %: 58.8 % (ref 43.0–77.0)
Platelets: 218 10*3/uL (ref 150.0–400.0)
RBC: 4.61 Mil/uL (ref 4.22–5.81)
RDW: 13.6 % (ref 11.5–15.5)
WBC: 8.5 10*3/uL (ref 4.0–10.5)

## 2016-01-15 LAB — URINALYSIS
BILIRUBIN URINE: NEGATIVE
Hgb urine dipstick: NEGATIVE
LEUKOCYTES UA: NEGATIVE
NITRITE: NEGATIVE
PH: 5.5 (ref 5.0–8.0)
Specific Gravity, Urine: 1.02 (ref 1.000–1.030)
Total Protein, Urine: NEGATIVE
Urine Glucose: 250 — AB
Urobilinogen, UA: 0.2 (ref 0.0–1.0)

## 2016-01-15 LAB — TSH: TSH: 4.44 u[IU]/mL (ref 0.35–4.50)

## 2016-01-15 LAB — VITAMIN B12: VITAMIN B 12: 340 pg/mL (ref 200–1100)

## 2016-01-15 LAB — FOLATE: Folate: >24 ng/mL (ref >5.4)

## 2016-01-15 MED ORDER — GADOBENATE DIMEGLUMINE 529 MG/ML IV SOLN
15.0000 mL | Freq: Once | INTRAVENOUS | Status: AC | PRN
  Administered 2016-01-15: 15 mL via INTRAVENOUS

## 2016-01-15 NOTE — Telephone Encounter (Signed)
Pt went to Neurology today (01/15/16).

## 2016-01-15 NOTE — Patient Instructions (Addendum)
1.  I would like to get MRI of brain with and without contrast.  If we cannot get it immediately, then we will get CT of head in the meantime 2.  Check urine for UTI and urine drug screen 3.  Check blood work for CBC with diff, CMP, B12, TSH, folate, RPR, heavy metal panel, vitamin A, vitamin D, ETOH 4.  If he seems to get worse, he will need to go to the ER. 5.  Further testing pending results

## 2016-01-15 NOTE — Progress Notes (Signed)
NEUROLOGY FOLLOW UP OFFICE NOTE  SHADY AKE RN:[id number]  HISTORY OF PRESENT ILLNESS: [name] [name] is a 70 year old right-handed man with type 2 diabetes mellitus, OSA, hyperlipidemia, coronary artery disease, and hypertension and cervicogenic headache and occipital neuralgia who now follows up for falls and confusion.  History obtained by patient, his daughter and wife.  Since last Saturday, he has had a decline in cognition.  He has had approximately 4 falls.  It is uncertain if he simply gets weak, passes out, or feels dizzy.  He appears more agitated.  He talks and fidgets in his sleep.  When he wakes up from a nap, he may briefly be talking to his grandson who is not there.  He cannot get comfortable, when he sits or lays down.  He seems more tremulous.  He has past history of confusion related to hyperglycemia, with sugars in the 700s.  However, he has been doing well and reportedly keeping sugars no higher than 130s-140s.  For the past couple of weeks, he has had increase in his typical headaches and will sometimes hold his head.  Last weekend, he vomited once.  He has not had any fevers or otherwise been ill.  He has not been drinking alcohol, taking new prescription medication or using illicit drugs.  He hit his head lightly on the freezer door of the refrigerator, but that occurred after onset of symptoms.  He has not had fever, hallucinations or incontinence.  For at least a year (exact onset unknown), he has had trouble with memory.  He sometimes gets disoriented on familiar routes when driving.  He can no longer pay bills because he forgets.  He has not had trouble recognizing people.    There is no known family history of dementia.  He graduated 12th grade.  He is retired.  For headache, he is taking gabapentin 600mg  in AM, 300mg  at noon, and 600mg  at night.  He takes tizanidine 4mg  as needed for neck pain.  PAST MEDICAL HISTORY: Past Medical History  Diagnosis Date  .  Diabetes mellitus type II, uncontrolled ([place])   . OSA (obstructive sleep apnea)   . Hyperlipidemia   . RBBB (right bundle branch block)   . Depression     Bipolar  . Edema   . Hypertension   . Falling episodes     these have occurred in the past and again recurring 2011  . Spondylosis     C5-6, C6-7 MRI 2010  . CAD (coronary artery disease)     A. CABG in 2000,status post cardiac cath in 2006, 2009 ....continued chest pain and SOB despite oral medication adjestments including Ranexa. B. Cath November 2009/ mRCA - 2.75 x 23 Abbott Xience V drug-eluting stent ...11/26/2008 to distal  RCA leading to acute marginal.  C. Cath 07/2012 for CP - stable anatomy, med rx. d. cath 2015 and 05/30/2015 stable anatomy, consider Myoview if has CP again  . Hx of CABG     2000,  / one median sternotomy suture broken her chest x-ray November, 2010, no clinical significance  . Palpitations     event recorder showed sinus rhythm  . Nephrolithiasis   . Restless leg   . Dizziness   . Shortness of breath     CPX April, 2011, mild functional limitation, no clear pulmonary or cardiac limitation, possible deconditioning and mild chronotropic incompetence( peak heart rate 130)  . Cerebral ischemia     MRI November, 2010, chronic microvascular ischemia  .  Anemia     hemoglobin 7.4, iron deficiency, January, 2011, 2 unit transfusion, endoscopy normal, capsule endoscopy February, 2011 no small bowel abnormalities.   Most likely source gastric erosions, followed by GI  . H/O medication noncompliance     Due to loss of insurance  . Ejection fraction     EF 60%, echo, July 31, 2012  . Diabetes mellitus without complication ([place])   . Pain in limb 06/12/2009    Qualifier: Diagnosis of  By: [name]   . Low back pain 06/12/2009    Qualifier: Diagnosis of  By: [name]   . Thyroid disease   . Right knee pain 01/07/2015  . Advance care planning 05/01/2015  . ACP (advance care planning) 05/11/2015     MEDICATIONS: Current Outpatient Prescriptions on File Prior to Visit  Medication Sig Dispense Refill  . aspirin EC 325 MG tablet Take 325 mg by mouth at bedtime.     Marland Kitchen atorvastatin (LIPITOR) 40 MG tablet Take 1 tablet (40 mg total) by mouth daily at 6 PM. 30 tablet 5  . clotrimazole-betamethasone (LOTRISONE) cream APPLY TO THE AFFECTED TWICE DAILY (Patient taking differently: Apply 1 application topically 2 (two) times daily as needed (affected area). ) 30 g 6  . diclofenac sodium (VOLTAREN) 1 % GEL Apply 2 g topically 3 (three) times daily as needed. For pain to affected area 2 Tube 3  . gabapentin (NEURONTIN) 300 MG capsule 2 capsules in AM, 1 capsule at noon, 2 capsules in PM daily (Patient taking differently: Take 300-600 mg by mouth 3 (three) times daily. 2 capsules in AM, 1 capsule at noon, 2 capsules in PM daily) 150 capsule 3  . insulin aspart (NOVOLOG) 100 UNIT/ML injection Inject 10-17 Units into the skin 3 (three) times daily with meals. Use 10-17 unit 3 times daily before meals (Patient taking differently: Inject 10-17 Units into the skin 3 (three) times daily with meals. Use 10-17 units on a sliding scale. 17 units = 250 and above) 15 mL 4  . insulin glargine (LANTUS) 100 UNIT/ML injection Inject 42 Units into the skin at bedtime.    . Insulin Glargine (TOUJEO SOLOSTAR) 300 UNIT/ML SOPN Inject 42 Units into the skin at bedtime. 3 pen 4  . isosorbide mononitrate (IMDUR) 120 MG 24 hr tablet Take 1 tablet (120 mg total) by mouth daily. 90 tablet 0  . levothyroxine (SYNTHROID, LEVOTHROID) 75 MCG tablet Take 1 tablet (75 mcg total) by mouth daily. 90 tablet 2  . metFORMIN (GLUCOPHAGE) 1000 MG tablet Take 1 tablet (1,000 mg total) by mouth 2 (two) times daily with a meal. 60 tablet 5  . metoprolol (LOPRESSOR) 25 MG tablet Take 0.5 tablets (12.5 mg total) by mouth 2 (two) times daily. 60 tablet 1  . nitroGLYCERIN (NITROSTAT) 0.4 MG SL tablet Place 1 tablet (0.4 mg total) under the tongue  every 5 (five) minutes as needed (up to 3 doses). 25 tablet 4  . pantoprazole (PROTONIX) 40 MG tablet Take 40 mg by mouth daily.    . pramipexole (MIRAPEX) 0.5 MG tablet Take 1-2 tablets (0.5-1 mg total) by mouth at bedtime. 60 tablet 2  . sertraline (ZOLOFT) 100 MG tablet Take 1 tablet (100 mg total) by mouth 2 (two) times daily. 180 tablet 1  . tiZANidine (ZANAFLEX) 4 MG tablet Take 1 tablet (4 mg total) by mouth every 8 (eight) hours as needed for muscle spasms. 90 tablet 2  . topiramate (TOPAMAX) 50 MG  tablet Take 1 tablet (50 mg total) by mouth at bedtime. 30 tablet 3   No current facility-administered medications on file prior to visit.    ALLERGIES: Allergies  Allergen Reactions  . Morphine Other (See Comments)    hallucinations  . Shellfish-Derived Products Itching    FAMILY HISTORY: Family History  Problem Relation Age of Onset  . Cancer Brother     pancreatic cancer  . Diabetes Brother     4   . Stroke Brother   . Diabetes Brother   . Cancer Brother   . Diabetes Brother   . Coronary artery disease Brother   . Diabetes Brother   . Hypothyroidism Brother   . Heart attack      Nephew  . Diabetes Mother   . Heart failure Mother   . Heart failure Father   . Irregular heart beat Daughter   . Cancer Maternal Grandmother     unknown     SOCIAL HISTORY: Social History   Social History  . Marital Status: Married    Spouse Name: N/A  . Number of Children: 4  . Years of Education: 13   Occupational History  . retired    Social History Main Topics  . Smoking status: Never Smoker   . Smokeless tobacco: Never Used  . Alcohol Use: No  . Drug Use: No  . Sexual Activity:    Partners: Female   Other Topics Concern  . Not on file   Social History Narrative   Patient is right handed.   Patient does not drink caffeine.    REVIEW OF SYSTEMS: Constitutional: No fevers, chills, or sweats, no generalized fatigue, change in appetite Eyes: No visual changes,  double vision, eye pain Ear, nose and throat: No hearing loss, ear pain, nasal congestion, sore throat Cardiovascular: No chest pain, palpitations Respiratory:  No shortness of breath at rest or with exertion, wheezes GastrointestinaI: No nausea, vomiting, diarrhea, abdominal pain, fecal incontinence Genitourinary:  No dysuria, urinary retention or frequency Musculoskeletal:  No neck pain, back pain Integumentary: No rash, pruritus, skin lesions Neurological: as above Psychiatric: No depression, insomnia, anxiety Endocrine: No palpitations, fatigue, diaphoresis, mood swings, change in appetite, change in weight, increased thirst Hematologic/Lymphatic:  No anemia, purpura, petechiae. Allergic/Immunologic: no itchy/runny eyes, nasal congestion, recent allergic reactions, rashes  PHYSICAL EXAM: Filed Vitals:   01/15/16 1402  BP: 124/76  Pulse: 70   General: No acute distress.  Patient appears well-groomed.  normal body habitus. Head:  Normocephalic/atraumatic Eyes:  Fundoscopic exam unremarkable without vessel changes, exudates, hemorrhages or papilledema. Neck: supple, no paraspinal tenderness, full range of motion Heart:  Regular rate and rhythm Lungs:  Clear to auscultation bilaterally Back: No paraspinal tenderness Neurological Exam: alert and oriented to person, place, and time. Attention span and concentration mildly impaired, recalled 2 of 3 words, remote memory intact, fund of knowledge intact.  Speech fluent and not dysarthric, language intact.   MMSE - Mini Mental State Exam 01/15/2016 02/18/2015  Orientation to time 5 5  Orientation to Place 4 5  Registration 3 3  Attention/ Calculation 0 2  Recall 2 3  Language- name 2 objects 2 2  Language- repeat 1 1  Language- follow 3 step command 3 3  Language- read & follow direction 1 1  Write a sentence 1 1  Copy design 1 1  Total score 23 27   CN II-XII intact. Fundoscopic exam unremarkable without vessel changes, exudates,  hemorrhages or papilledema.  Bulk and  tone normal, muscle strength 5/5 throughout.  Sensation to light touch, temperature and vibration intact.  Deep tendon reflexes 2+ throughout, toes downgoing.  Finger to nose and heel to shin testing intact.  Mild postural tremor in hands.  Gait mildly cautious.  Stumbled when turned.  Trouble with tandem walk.  Romberg negative  IMPRESSION: Altered mental status Cognitive impairment  I suspect he has an underlying neurodegenerative disease/dementia.  However, a more aggressive workup is warranted given the drastic decline over the past week.  PLAN: 1.  I would like to get MRI of brain with and without contrast.  If we cannot get it immediately, then we will get CT of head in the meantime 2.  Check urine for UTI and urine drug screen 3.  Check blood work for CBC with diff, CMP, B12, TSH, folate, RPR, heavy metal panel, vitamin A, vitamin D, ETOH 4.  If he seems to get worse, he will need to go to the ER. 5.  No driving 6.  Further testing pending results  28 minutes spent face to face with patient, over 50% spent discussing diagnosis and management.  Metta Clines, DO  CC:  Penni Homans, MD

## 2016-01-16 ENCOUNTER — Telehealth: Payer: Self-pay

## 2016-01-16 ENCOUNTER — Ambulatory Visit (INDEPENDENT_AMBULATORY_CARE_PROVIDER_SITE_OTHER): Payer: Commercial Managed Care - HMO | Admitting: Family Medicine

## 2016-01-16 ENCOUNTER — Encounter: Payer: Self-pay | Admitting: Family Medicine

## 2016-01-16 VITALS — BP 134/81 | HR 70 | Temp 97.8°F | Ht 69.0 in | Wt 174.1 lb

## 2016-01-16 DIAGNOSIS — G4733 Obstructive sleep apnea (adult) (pediatric): Secondary | ICD-10-CM

## 2016-01-16 DIAGNOSIS — E559 Vitamin D deficiency, unspecified: Secondary | ICD-10-CM

## 2016-01-16 DIAGNOSIS — R112 Nausea with vomiting, unspecified: Secondary | ICD-10-CM

## 2016-01-16 DIAGNOSIS — R519 Headache, unspecified: Secondary | ICD-10-CM

## 2016-01-16 DIAGNOSIS — I10 Essential (primary) hypertension: Secondary | ICD-10-CM

## 2016-01-16 DIAGNOSIS — R4182 Altered mental status, unspecified: Secondary | ICD-10-CM

## 2016-01-16 DIAGNOSIS — E1310 Other specified diabetes mellitus with ketoacidosis without coma: Secondary | ICD-10-CM | POA: Diagnosis not present

## 2016-01-16 DIAGNOSIS — R51 Headache: Secondary | ICD-10-CM

## 2016-01-16 DIAGNOSIS — R410 Disorientation, unspecified: Secondary | ICD-10-CM

## 2016-01-16 DIAGNOSIS — E1110 Type 2 diabetes mellitus with ketoacidosis without coma: Secondary | ICD-10-CM

## 2016-01-16 LAB — COMPREHENSIVE METABOLIC PANEL
ALK PHOS: 68 U/L (ref 40–115)
ALT: 13 U/L (ref 9–46)
AST: 16 U/L (ref 10–35)
Albumin: 4.6 g/dL (ref 3.6–5.1)
BILIRUBIN TOTAL: 0.7 mg/dL (ref 0.2–1.2)
BUN: 12 mg/dL (ref 7–25)
CO2: 28 mmol/L (ref 20–31)
Calcium: 9.4 mg/dL (ref 8.6–10.3)
Chloride: 102 mmol/L (ref 98–110)
Creat: 1.02 mg/dL (ref 0.70–1.25)
GLUCOSE: 107 mg/dL — AB (ref 65–99)
Potassium: 4.1 mmol/L (ref 3.5–5.3)
Sodium: 137 mmol/L (ref 135–146)
TOTAL PROTEIN: 6.9 g/dL (ref 6.1–8.1)

## 2016-01-16 LAB — URINE CULTURE

## 2016-01-16 LAB — ETHANOL

## 2016-01-16 LAB — VITAMIN D 25 HYDROXY (VIT D DEFICIENCY, FRACTURES): Vit D, 25-Hydroxy: 10 ng/mL — ABNORMAL LOW (ref 30–100)

## 2016-01-16 MED ORDER — CHOLECALCIFEROL 1.25 MG (50000 UT) PO TABS: 1.0000 | ORAL_TABLET | ORAL | Status: DC

## 2016-01-16 MED ORDER — ONDANSETRON 4 MG PO TBDP: 4.0000 mg | ORAL_TABLET | Freq: Three times a day (TID) | ORAL | Status: DC | PRN

## 2016-01-16 MED ORDER — HYOSCYAMINE SULFATE 0.125 MG PO TBDP: 0.1250 mg | ORAL_TABLET | ORAL | Status: DC | PRN

## 2016-01-16 MED FILL — VIT D3-50 50,000 UNITS CAPS: 1.25 MG | 84 days supply | Qty: 12 | Fill #0

## 2016-01-16 NOTE — Telephone Encounter (Signed)
Orders placed. Pt has PCP appointment today, will have drawn there. Patient aware of results.

## 2016-01-16 NOTE — Progress Notes (Signed)
Pre visit review using our clinic review tool, if applicable. No additional management support is needed unless otherwise documented below in the visit note. 

## 2016-01-16 NOTE — Telephone Encounter (Signed)
-----   Message from Pieter Partridge, DO sent at 01/16/2016  7:35 AM EST ----- MRI of brain shows nothing acute or concerning.  Labs are still pending but I notice that CMP, Urine Drug Screen and RPR weren't ordered, which is imperative.

## 2016-01-16 NOTE — Patient Instructions (Signed)
Hiccups °A hiccup is the result of a sudden shortening of the muscle below your lungs (diaphragm). This movement of your diaphragm causes a sudden inhalation followed by the closing of your vocal cords, which causes the hiccup sound. Most people get the hiccups. Typically, hiccups last only a short amount of time.  °There are three types of hiccups:  °· Benign. These hiccups last less than 48 hours.   °· Persistent. These hiccups last more than 48 hours, but less than 1 month.   °· Intractable. These hiccups last more than 1 month.   °A hiccup is a reflex. You cannot control reflexes.  °HOME CARE INSTRUCTIONS  °Watch your hiccups for any changes. The following actions may help to lessen any discomfort that you are feeling: °· Eat small meals.   °· Limit alcohol intake to no more than 1 drink per day for nonpregnant women and 2 drinks per day for men. One drink equals 12 oz of beer, 5 oz of wine, or 1½ oz of hard liquor. °· Limit drinking carbonated or fizzy drinks, such as soda. °· Eat and chew your food slowly.   °· Avoid eating or drinking hot or spicy foods and drinks. °· Take medicines only as directed by your health care provider.   °SEEK MEDICAL CARE IF:  °· Your hiccups last for more than 48 hours.   °· Your hiccups do not improve with treatment. °· You cannot sleep or eat due to the hiccups.   °· You have unexpected weight loss due to the hiccups.   °· You have a fever.   °· You have trouble breathing or swallowing.   °· You develop severe pain in your abdomen. °· You develop numbness, tingling, or weakness. °  °This information is not intended to replace advice given to you by your health care provider. Make sure you discuss any questions you have with your health care provider. °  °Document Released: 01/31/2002 Document Revised: 04/08/2015 Document Reviewed: 11/18/2014 °Elsevier Interactive Patient Education ©2016 [employer] ° °

## 2016-01-17 LAB — SEDIMENTATION RATE: SED RATE: 4 mm/h (ref 0–20)

## 2016-01-17 LAB — HEAVY METALS PANEL, BLOOD
Arsenic: <3 mcg/L (ref <23)
Lead: <1 ug/dL (ref <5)
Mercury, B: <4 mcg/L (ref <=10)

## 2016-01-17 LAB — RPR

## 2016-01-18 LAB — VITAMIN A: Vitamin A (Retinoic Acid): 70 ug/dL (ref 38–98)

## 2016-01-19 ENCOUNTER — Telehealth: Payer: Self-pay

## 2016-01-19 NOTE — Telephone Encounter (Signed)
Message relayed to patient. Verbalized understanding and denied questions.   

## 2016-01-19 NOTE — Telephone Encounter (Signed)
-----   Message from Alda Berthold, DO sent at 01/19/2016  8:04 AM EST ----- Please inform patient that labs are within normal limits, except vitamin D is very low.  Please send Rx for vitamin D 50000 units take one tablet weekly x 10 weeks.  Thanks.

## 2016-01-25 ENCOUNTER — Encounter: Payer: Self-pay | Admitting: Family Medicine

## 2016-01-25 DIAGNOSIS — R51 Headache: Secondary | ICD-10-CM

## 2016-01-25 DIAGNOSIS — R112 Nausea with vomiting, unspecified: Secondary | ICD-10-CM | POA: Insufficient documentation

## 2016-01-25 DIAGNOSIS — R519 Headache, unspecified: Secondary | ICD-10-CM | POA: Insufficient documentation

## 2016-01-25 NOTE — Assessment & Plan Note (Signed)
Imaging and labs unrevealing. Encouraged increased hydration, 64 ounces of clear fluids daily. Minimize alcohol and caffeine. Eat small frequent meals with lean proteins and complex carbs. Avoid high and low blood sugars. Get adequate sleep, 7-8 hours a night. Needs exercise daily preferably in the morning.

## 2016-01-25 NOTE — Progress Notes (Signed)
Patient ID: [name] [name], male   DOB: 08-02-1946, 70 y.o.   MRN: [id number]   Subjective:    Patient ID: [name] [name], male    DOB: December 21, 1945, 70 y.o.   MRN: [id number]  Chief Complaint  Patient presents with  . Fatigue  . Hiccups    HPI Patient is in today for followup on recent hospital visits. Continues to struggle with headaches although they are improving. No trauma or new concerns. He has no photophobia or phonophobia. Continues to struggle with with reflux, nausea and vomiting although these are improving also. Is noting daily headaches and he notes his left hand tremor is worsening as well. Denies CP/palp/SOB/HA/congestion/fevers/GU c/o. Taking meds as prescribed  Past Medical History  Diagnosis Date  . Diabetes mellitus type II, uncontrolled ([place])   . OSA (obstructive sleep apnea)   . Hyperlipidemia   . RBBB (right bundle branch block)   . Depression     Bipolar  . Edema   . Hypertension   . Falling episodes     these have occurred in the past and again recurring 2011  . Spondylosis     C5-6, C6-7 MRI 2010  . CAD (coronary artery disease)     A. CABG in 2000,status post cardiac cath in 2006, 2009 ....continued chest pain and SOB despite oral medication adjestments including Ranexa. B. Cath November 2009/ mRCA - 2.75 x 23 Abbott Xience V drug-eluting stent ...11/26/2008 to distal  RCA leading to acute marginal.  C. Cath 07/2012 for CP - stable anatomy, med rx. d. cath 2015 and 05/30/2015 stable anatomy, consider Myoview if has CP again  . Hx of CABG     2000,  / one median sternotomy suture broken her chest x-ray November, 2010, no clinical significance  . Palpitations     event recorder showed sinus rhythm  . Nephrolithiasis   . Restless leg   . Dizziness   . Shortness of breath     CPX April, 2011, mild functional limitation, no clear pulmonary or cardiac limitation, possible deconditioning and mild chronotropic incompetence( peak heart rate 130)  . Cerebral  ischemia     MRI November, 2010, chronic microvascular ischemia  . Anemia     hemoglobin 7.4, iron deficiency, January, 2011, 2 unit transfusion, endoscopy normal, capsule endoscopy February, 2011 no small bowel abnormalities.   Most likely source gastric erosions, followed by GI  . H/O medication noncompliance     Due to loss of insurance  . Ejection fraction     EF 60%, echo, July 31, 2012  . Diabetes mellitus without complication ([place])   . Pain in limb 06/12/2009    Qualifier: Diagnosis of  By: [name]   . Low back pain 06/12/2009    Qualifier: Diagnosis of  By: [name]   . Thyroid disease   . Right knee pain 01/07/2015  . Advance care planning 05/01/2015  . ACP (advance care planning) 05/11/2015  . Nausea with vomiting 01/25/2016  . Headache 01/25/2016    Past Surgical History  Procedure Laterality Date  . Nasal septum surgery      UP3  . Coronary artery bypass graft      2000  . Left heart catheterization with coronary/graft angiogram N/A 08/01/2012    Procedure: LEFT HEART CATHETERIZATION WITH Beatrix Fetters;  Surgeon: [name], MD;  Location: [facility] CATH LAB;  Service: Cardiovascular;  Laterality: N/A;  . Left heart catheterization with coronary/graft angiogram N/A 01/03/2015  Procedure: LEFT HEART CATHETERIZATION WITH Beatrix Fetters;  Surgeon: [name], MD;  Location: [facility] CATH LAB;  Service: Cardiovascular;  Laterality: N/A;  . Percutaneous coronary stent intervention (pci-s)  10/2008    mRCA PCI  2.75 x 23 Abbott Xience V drug-eluting stent   . Cardiac catheterization N/A 05/30/2015    Procedure: Left Heart Cath and Coronary Angiography;  Surgeon: [name], MD;  Location: [facility] CV LAB;  Service: Cardiovascular;  Laterality: N/A;    Family History  Problem Relation Age of Onset  . Cancer Brother     pancreatic cancer  . Diabetes Brother     4   . Stroke Brother   . Diabetes Brother   . Cancer Brother   .  Diabetes Brother   . Coronary artery disease Brother   . Diabetes Brother   . Hypothyroidism Brother   . Heart attack      Nephew  . Diabetes Mother   . Heart failure Mother   . Heart failure Father   . Irregular heart beat Daughter   . Cancer Maternal Grandmother     unknown     Social History   Social History  . Marital Status: Married    Spouse Name: N/A  . Number of Children: 4  . Years of Education: 13   Occupational History  . retired    Social History Main Topics  . Smoking status: Never Smoker   . Smokeless tobacco: Never Used  . Alcohol Use: No  . Drug Use: No  . Sexual Activity:    Partners: Female   Other Topics Concern  . Not on file   Social History Narrative   Patient is right handed.   Patient does not drink caffeine.    Outpatient Prescriptions Prior to Visit  Medication Sig Dispense Refill  . aspirin EC 325 MG tablet Take 325 mg by mouth at bedtime.     Marland Kitchen atorvastatin (LIPITOR) 40 MG tablet Take 1 tablet (40 mg total) by mouth daily at 6 PM. 30 tablet 5  . clotrimazole-betamethasone (LOTRISONE) cream APPLY TO THE AFFECTED TWICE DAILY (Patient taking differently: Apply 1 application topically 2 (two) times daily as needed (affected area). ) 30 g 6  . diclofenac sodium (VOLTAREN) 1 % GEL Apply 2 g topically 3 (three) times daily as needed. For pain to affected area 2 Tube 3  . gabapentin (NEURONTIN) 300 MG capsule 2 capsules in AM, 1 capsule at noon, 2 capsules in PM daily (Patient taking differently: Take 300-600 mg by mouth 3 (three) times daily. 2 capsules in AM, 1 capsule at noon, 2 capsules in PM daily) 150 capsule 3  . insulin aspart (NOVOLOG) 100 UNIT/ML injection Inject 10-17 Units into the skin 3 (three) times daily with meals. Use 10-17 unit 3 times daily before meals (Patient taking differently: Inject 10-17 Units into the skin 3 (three) times daily with meals. Use 10-17 units on a sliding scale. 17 units = 250 and above) 15 mL 4  . insulin  glargine (LANTUS) 100 UNIT/ML injection Inject 42 Units into the skin at bedtime.    . Insulin Glargine (TOUJEO SOLOSTAR) 300 UNIT/ML SOPN Inject 42 Units into the skin at bedtime. 3 pen 4  . isosorbide mononitrate (IMDUR) 120 MG 24 hr tablet Take 1 tablet (120 mg total) by mouth daily. 90 tablet 0  . levothyroxine (SYNTHROID, LEVOTHROID) 75 MCG tablet Take 1 tablet (75 mcg total) by mouth daily. 90 tablet 2  .  metFORMIN (GLUCOPHAGE) 1000 MG tablet Take 1 tablet (1,000 mg total) by mouth 2 (two) times daily with a meal. 60 tablet 5  . metoprolol (LOPRESSOR) 25 MG tablet Take 0.5 tablets (12.5 mg total) by mouth 2 (two) times daily. 60 tablet 1  . nitroGLYCERIN (NITROSTAT) 0.4 MG SL tablet Place 1 tablet (0.4 mg total) under the tongue every 5 (five) minutes as needed (up to 3 doses). 25 tablet 4  . pantoprazole (PROTONIX) 40 MG tablet Take 40 mg by mouth daily.    . pramipexole (MIRAPEX) 0.5 MG tablet Take 1-2 tablets (0.5-1 mg total) by mouth at bedtime. 60 tablet 2  . sertraline (ZOLOFT) 100 MG tablet Take 1 tablet (100 mg total) by mouth 2 (two) times daily. 180 tablet 1  . tiZANidine (ZANAFLEX) 4 MG tablet Take 1 tablet (4 mg total) by mouth every 8 (eight) hours as needed for muscle spasms. 90 tablet 2  . topiramate (TOPAMAX) 50 MG tablet Take 1 tablet (50 mg total) by mouth at bedtime. 30 tablet 3   No facility-administered medications prior to visit.    Allergies  Allergen Reactions  . Morphine Other (See Comments)    hallucinations  . Shellfish-Derived Products Itching    Review of Systems  Constitutional: Positive for malaise/fatigue. Negative for fever.  HENT: Negative for congestion.   Eyes: Negative for discharge.  Respiratory: Negative for shortness of breath.   Cardiovascular: Negative for chest pain, palpitations and leg swelling.  Gastrointestinal: Positive for heartburn, nausea and vomiting. Negative for abdominal pain.  Genitourinary: Negative for dysuria.    Musculoskeletal: Negative for falls.  Skin: Negative for rash.  Neurological: Positive for headaches. Negative for loss of consciousness.  Endo/Heme/Allergies: Negative for environmental allergies.  Psychiatric/Behavioral: Negative for depression. The patient is nervous/anxious.        Objective:    Physical Exam  Constitutional: He is oriented to person, place, and time. He appears well-developed and well-nourished. No distress.  HENT:  Head: Normocephalic and atraumatic.  Nose: Nose normal.  Eyes: Right eye exhibits no discharge. Left eye exhibits no discharge.  Neck: Normal range of motion. Neck supple.  Cardiovascular: Normal rate and regular rhythm.   No murmur heard. Pulmonary/Chest: Effort normal and breath sounds normal.  Abdominal: Soft. Bowel sounds are normal. There is no tenderness.  Musculoskeletal: He exhibits no edema.  Neurological: He is alert and oriented to person, place, and time. He displays normal reflexes. No cranial nerve deficit. Coordination normal.  Skin: Skin is warm and dry.  Psychiatric: He has a normal mood and affect.  Nursing note and vitals reviewed.   BP 134/81 mmHg  Pulse 70  Temp(Src) 97.8 F (36.6 C) (Oral)  Ht 5' 9"  (1.753 m)  Wt 174 lb 2 oz (78.983 kg)  BMI 25.70 kg/m2  SpO2 100% Wt Readings from Last 3 Encounters:  01/16/16 174 lb 2 oz (78.983 kg)  01/15/16 174 lb (78.926 kg)  11/19/15 177 lb 14.4 oz (80.695 kg)     Lab Results  Component Value Date   WBC 8.5 01/15/2016   HGB 14.2 01/15/2016   HCT 41.7 01/15/2016   PLT 218.0 01/15/2016   GLUCOSE 107* 01/16/2016   CHOL 162 05/01/2015   TRIG 107.0 05/01/2015   HDL 33.90* 05/01/2015   LDLDIRECT 46.5 09/21/2012   LDLCALC 107* 05/01/2015   ALT 13 01/16/2016   AST 16 01/16/2016   NA 137 01/16/2016   K 4.1 01/16/2016   CL 102 01/16/2016   CREATININE 1.02  01/16/2016   BUN 12 01/16/2016   CO2 28 01/16/2016   TSH 4.44 01/15/2016   PSA 2.39 11/25/2010   INR 1.08  11/25/2015   HGBA1C 10.7* 05/29/2015   MICROALBUR 1.1 05/01/2015    Lab Results  Component Value Date   TSH 4.44 01/15/2016   Lab Results  Component Value Date   WBC 8.5 01/15/2016   HGB 14.2 01/15/2016   HCT 41.7 01/15/2016   MCV 90.5 01/15/2016   PLT 218.0 01/15/2016   Lab Results  Component Value Date   NA 137 01/16/2016   K 4.1 01/16/2016   CO2 28 01/16/2016   GLUCOSE 107* 01/16/2016   BUN 12 01/16/2016   CREATININE 1.02 01/16/2016   BILITOT 0.7 01/16/2016   ALKPHOS 68 01/16/2016   AST 16 01/16/2016   ALT 13 01/16/2016   PROT 6.9 01/16/2016   ALBUMIN 4.6 01/16/2016   CALCIUM 9.4 01/16/2016   ANIONGAP 10 11/25/2015   GFR 84.14 05/01/2015   Lab Results  Component Value Date   CHOL 162 05/01/2015   Lab Results  Component Value Date   HDL 33.90* 05/01/2015   Lab Results  Component Value Date   LDLCALC 107* 05/01/2015   Lab Results  Component Value Date   TRIG 107.0 05/01/2015   Lab Results  Component Value Date   CHOLHDL 5 05/01/2015   Lab Results  Component Value Date   HGBA1C 10.7* 05/29/2015       Assessment & Plan:   Problem List Items Addressed This Visit    Diabetes mellitus type II, uncontrolled ([place]) (Chronic)   Relevant Orders   RPR (Completed)   Comprehensive metabolic panel (Completed)   Sed Rate (ESR) (Completed)   Essential hypertension (Chronic)    Well controlled, no changes to meds. Encouraged heart healthy diet such as the DASH diet and exercise as tolerated.       Headache    Imaging and labs unrevealing. Encouraged increased hydration, 64 ounces of clear fluids daily. Minimize alcohol and caffeine. Eat small frequent meals with lean proteins and complex carbs. Avoid high and low blood sugars. Get adequate sleep, 7-8 hours a night. Needs exercise daily preferably in the morning.      Nausea with vomiting    Improving but persistent. Referred to gastroenterology for further consideration      Relevant Orders    Ambulatory referral to Gastroenterology   OSA (obstructive sleep apnea)    Hs not treated in over 5 years. Agrees to referral to pulmonology for further treatment. Likely contributing to his headaches.        Other Visit Diagnoses    Altered mental status, unspecified altered mental status type    -  Primary    Relevant Orders    RPR (Completed)    Comprehensive metabolic panel (Completed)    Sed Rate (ESR) (Completed)    Vitamin D deficiency        Relevant Medications    Cholecalciferol (DIALYVITE VITAMIN D3 MAX) 32919 units TABS    Obstructive sleep apnea        Relevant Orders    Ambulatory referral to Pulmonology     Hiccups: Given Hyoscyamine to try prn.   I am having Mr. Brashier start on hyoscyamine, Cholecalciferol, and ondansetron. I am also having him maintain his nitroGLYCERIN, topiramate, clotrimazole-betamethasone, diclofenac sodium, levothyroxine, atorvastatin, metFORMIN, aspirin EC, sertraline, pantoprazole, Insulin Glargine, insulin aspart, gabapentin, tiZANidine, insulin glargine, metoprolol tartrate, pramipexole, and isosorbide mononitrate.  Meds ordered this encounter  Medications  . hyoscyamine (ANASPAZ) 0.125 MG TBDP disintergrating tablet    Sig: Place 1 tablet (0.125 mg total) under the tongue every 4 (four) hours as needed for cramping.    Dispense:  25 tablet    Refill:  2  . Cholecalciferol (DIALYVITE VITAMIN D3 MAX) 70263 units TABS    Sig: Take 1 tablet by mouth once a week.    Dispense:  12 tablet    Refill:  1  . ondansetron (ZOFRAN-ODT) 4 MG disintegrating tablet    Sig: Take 1 tablet (4 mg total) by mouth every 8 (eight) hours as needed for nausea or vomiting.    Dispense:  20 tablet    Refill:  0     Penni Homans, MD

## 2016-01-25 NOTE — Assessment & Plan Note (Signed)
Hs not treated in over 5 years. Agrees to referral to pulmonology for further treatment. Likely contributing to his headaches.

## 2016-01-25 NOTE — Assessment & Plan Note (Signed)
Improving but persistent. Referred to gastroenterology for further consideration

## 2016-01-25 NOTE — Assessment & Plan Note (Signed)
Well controlled, no changes to meds. Encouraged heart healthy diet such as the DASH diet and exercise as tolerated.  °

## 2016-01-26 ENCOUNTER — Encounter: Payer: Self-pay | Admitting: Gastroenterology

## 2016-01-30 ENCOUNTER — Telehealth: Payer: Self-pay

## 2016-01-30 NOTE — Telephone Encounter (Signed)
Left message on machine for pt to return call to the office.  

## 2016-01-30 NOTE — Telephone Encounter (Signed)
-----   Message from Pieter Partridge, DO sent at 01/30/2016  7:26 AM EST ----- Could you call and follow up with patient/family regarding how he is doing.  As workup has been unremarkable, I would like to set him up for neuropsychological testing to assess his memory/cognitive problems and for him to see me afterwards.

## 2016-02-06 MED FILL — PRAMIPEXOLE 1 MG TABLET: 1 | 30 days supply | Qty: 30 | Fill #1

## 2016-02-06 MED FILL — METOPROLOL TARTRATE 25 MG T: 25 | 60 days supply | Qty: 60 | Fill #1

## 2016-02-06 MED FILL — tiZANidine HCL 4 MG TABS: 4 | 30 days supply | Qty: 90 | Fill #0

## 2016-02-06 MED FILL — PANTOPRAZOLE SOD DR 40 MG T: 40 | 30 days supply | Qty: 45 | Fill #3

## 2016-02-06 MED FILL — ATORVASTATIN 40 MG TABLET: 40 | 30 days supply | Qty: 30 | Fill #4

## 2016-02-09 ENCOUNTER — Encounter: Payer: Self-pay | Admitting: Family Medicine

## 2016-02-09 ENCOUNTER — Ambulatory Visit (INDEPENDENT_AMBULATORY_CARE_PROVIDER_SITE_OTHER): Payer: Commercial Managed Care - HMO | Admitting: Family Medicine

## 2016-02-09 VITALS — BP 122/82 | HR 71 | Temp 98.0°F | Ht 69.0 in | Wt 171.4 lb

## 2016-02-09 DIAGNOSIS — E785 Hyperlipidemia, unspecified: Secondary | ICD-10-CM | POA: Diagnosis not present

## 2016-02-09 DIAGNOSIS — N138 Other obstructive and reflux uropathy: Secondary | ICD-10-CM

## 2016-02-09 DIAGNOSIS — R079 Chest pain, unspecified: Secondary | ICD-10-CM | POA: Diagnosis not present

## 2016-02-09 DIAGNOSIS — I10 Essential (primary) hypertension: Secondary | ICD-10-CM | POA: Diagnosis not present

## 2016-02-09 DIAGNOSIS — E1165 Type 2 diabetes mellitus with hyperglycemia: Secondary | ICD-10-CM | POA: Diagnosis not present

## 2016-02-09 DIAGNOSIS — E039 Hypothyroidism, unspecified: Secondary | ICD-10-CM | POA: Diagnosis not present

## 2016-02-09 DIAGNOSIS — N401 Enlarged prostate with lower urinary tract symptoms: Secondary | ICD-10-CM

## 2016-02-09 DIAGNOSIS — R109 Unspecified abdominal pain: Secondary | ICD-10-CM

## 2016-02-09 DIAGNOSIS — R197 Diarrhea, unspecified: Secondary | ICD-10-CM

## 2016-02-09 MED ORDER — CLOTRIMAZOLE-BETAMETHASONE 1-0.05 % EX CREA: TOPICAL_CREAM | CUTANEOUS | Status: DC

## 2016-02-09 MED ORDER — LEVOTHYROXINE SODIUM 75 MCG PO TABS: 75.0000 ug | ORAL_TABLET | Freq: Every day | ORAL | Status: DC

## 2016-02-09 MED ORDER — GLUCOSE BLOOD VI STRP: ORAL_STRIP | Status: DC

## 2016-02-09 MED ORDER — GABAPENTIN 300 MG PO CAPS: ORAL_CAPSULE | ORAL | Status: DC

## 2016-02-09 MED ORDER — INSULIN ASPART 100 UNIT/ML ~~LOC~~ SOLN: 10.0000 [IU] | Freq: Three times a day (TID) | SUBCUTANEOUS | Status: DC

## 2016-02-09 MED ORDER — NITROGLYCERIN 0.4 MG SL SUBL: 0.4000 mg | SUBLINGUAL_TABLET | SUBLINGUAL | Status: DC | PRN

## 2016-02-09 MED FILL — ACCU-CHEK AVIVA PLUS TEST S: 20 days supply | Qty: 100 | Fill #0

## 2016-02-09 NOTE — Patient Instructions (Signed)

## 2016-02-09 NOTE — Progress Notes (Signed)
Pre visit review using our clinic review tool, if applicable. No additional management support is needed unless otherwise documented below in the visit note. 

## 2016-02-09 NOTE — Progress Notes (Addendum)
Patient ID: [name] [name], male   DOB: Sep 13, 1946, 70 y.o.   MRN: [id number]   Subjective:    Patient ID: [name] [name], male    DOB: 30-Jun-1946, 70 y.o.   MRN: [id number]  Chief Complaint  Patient presents with  . Anorexia  . Fall    HPI Patient is in today for follow up. He continues to struggle with several loose stool daily. No fevers but he is noting abdominal cramping at times. Notes anorexia but denies any bloody stool. Nausea noted but no vomiting. He endorses urinary frequency. No dysuria or hematuria. He has not been using his insulin and his sugars are very hi. He endorses anhedonia and anxiety. Denies CP/palp/SOB/HA/congestion/fevers or GU c/o. Taking meds as prescribed   Past Medical History  Diagnosis Date  . Diabetes mellitus type II, uncontrolled ([place])   . OSA (obstructive sleep apnea)   . Hyperlipidemia   . RBBB (right bundle branch block)   . Depression     Bipolar  . Edema   . Hypertension   . Falling episodes     these have occurred in the past and again recurring 2011  . Spondylosis     C5-6, C6-7 MRI 2010  . CAD (coronary artery disease)     A. CABG in 2000,status post cardiac cath in 2006, 2009 ....continued chest pain and SOB despite oral medication adjestments including Ranexa. B. Cath November 2009/ mRCA - 2.75 x 23 Abbott Xience V drug-eluting stent ...11/26/2008 to distal  RCA leading to acute marginal.  C. Cath 07/2012 for CP - stable anatomy, med rx. d. cath 2015 and 05/30/2015 stable anatomy, consider Myoview if has CP again  . Hx of CABG     2000,  / one median sternotomy suture broken her chest x-ray November, 2010, no clinical significance  . Palpitations     event recorder showed sinus rhythm  . Nephrolithiasis   . Restless leg   . Dizziness   . Shortness of breath     CPX April, 2011, mild functional limitation, no clear pulmonary or cardiac limitation, possible deconditioning and mild chronotropic incompetence( peak heart rate 130)  .  Cerebral ischemia     MRI November, 2010, chronic microvascular ischemia  . Anemia     hemoglobin 7.4, iron deficiency, January, 2011, 2 unit transfusion, endoscopy normal, capsule endoscopy February, 2011 no small bowel abnormalities.   Most likely source gastric erosions, followed by GI  . H/O medication noncompliance     Due to loss of insurance  . Ejection fraction     EF 60%, echo, July 31, 2012  . Diabetes mellitus without complication ([place])   . Pain in limb 06/12/2009    Qualifier: Diagnosis of  By: [name]   . Low back pain 06/12/2009    Qualifier: Diagnosis of  By: [name]   . Thyroid disease   . Right knee pain 01/07/2015  . Advance care planning 05/01/2015  . ACP (advance care planning) 05/11/2015  . Nausea with vomiting 01/25/2016  . Headache 01/25/2016  . Diarrhea 02/15/2016    Past Surgical History  Procedure Laterality Date  . Nasal septum surgery      UP3  . Coronary artery bypass graft      2000  . Left heart catheterization with coronary/graft angiogram N/A 08/01/2012    Procedure: LEFT HEART CATHETERIZATION WITH Beatrix Fetters;  Surgeon: [name], MD;  Location: [facility] CATH LAB;  Service: Cardiovascular;  Laterality: N/A;  . Left heart catheterization with coronary/graft angiogram N/A 01/03/2015    Procedure: LEFT HEART CATHETERIZATION WITH Beatrix Fetters;  Surgeon: [name], MD;  Location: [facility] CATH LAB;  Service: Cardiovascular;  Laterality: N/A;  . Percutaneous coronary stent intervention (pci-s)  10/2008    mRCA PCI  2.75 x 23 Abbott Xience V drug-eluting stent   . Cardiac catheterization N/A 05/30/2015    Procedure: Left Heart Cath and Coronary Angiography;  Surgeon: [name], MD;  Location: [facility] CV LAB;  Service: Cardiovascular;  Laterality: N/A;    Family History  Problem Relation Age of Onset  . Cancer Brother     pancreatic cancer  . Diabetes Brother     4   . Stroke Brother   . Diabetes  Brother   . Cancer Brother   . Diabetes Brother   . Coronary artery disease Brother   . Diabetes Brother   . Hypothyroidism Brother   . Heart attack      Nephew  . Diabetes Mother   . Heart failure Mother   . Heart failure Father   . Irregular heart beat Daughter   . Cancer Maternal Grandmother     unknown     Social History   Social History  . Marital Status: Married    Spouse Name: N/A  . Number of Children: 4  . Years of Education: 13   Occupational History  . retired    Social History Main Topics  . Smoking status: Never Smoker   . Smokeless tobacco: Never Used  . Alcohol Use: No  . Drug Use: No  . Sexual Activity:    Partners: Female   Other Topics Concern  . Not on file   Social History Narrative   Patient is right handed.   Patient does not drink caffeine.    Outpatient Prescriptions Prior to Visit  Medication Sig Dispense Refill  . aspirin EC 325 MG tablet Take 325 mg by mouth at bedtime.     Marland Kitchen atorvastatin (LIPITOR) 40 MG tablet Take 1 tablet (40 mg total) by mouth daily at 6 PM. 30 tablet 5  . Cholecalciferol (DIALYVITE VITAMIN D3 MAX) 57846 units TABS Take 1 tablet by mouth once a week. 12 tablet 1  . diclofenac sodium (VOLTAREN) 1 % GEL Apply 2 g topically 3 (three) times daily as needed. For pain to affected area 2 Tube 3  . hyoscyamine (ANASPAZ) 0.125 MG TBDP disintergrating tablet Place 1 tablet (0.125 mg total) under the tongue every 4 (four) hours as needed for cramping. 25 tablet 2  . insulin glargine (LANTUS) 100 UNIT/ML injection Inject 42 Units into the skin at bedtime.    . Insulin Glargine (TOUJEO SOLOSTAR) 300 UNIT/ML SOPN Inject 42 Units into the skin at bedtime. 3 pen 4  . isosorbide mononitrate (IMDUR) 120 MG 24 hr tablet Take 1 tablet (120 mg total) by mouth daily. 90 tablet 0  . metFORMIN (GLUCOPHAGE) 1000 MG tablet Take 1 tablet (1,000 mg total) by mouth 2 (two) times daily with a meal. 60 tablet 5  . metoprolol (LOPRESSOR) 25 MG  tablet Take 0.5 tablets (12.5 mg total) by mouth 2 (two) times daily. 60 tablet 1  . ondansetron (ZOFRAN-ODT) 4 MG disintegrating tablet Take 1 tablet (4 mg total) by mouth every 8 (eight) hours as needed for nausea or vomiting. 20 tablet 0  . pantoprazole (PROTONIX) 40 MG tablet Take 40 mg by mouth daily.    . pramipexole (  MIRAPEX) 0.5 MG tablet Take 1-2 tablets (0.5-1 mg total) by mouth at bedtime. 60 tablet 2  . sertraline (ZOLOFT) 100 MG tablet Take 1 tablet (100 mg total) by mouth 2 (two) times daily. 180 tablet 1  . tiZANidine (ZANAFLEX) 4 MG tablet Take 1 tablet (4 mg total) by mouth every 8 (eight) hours as needed for muscle spasms. 90 tablet 2  . topiramate (TOPAMAX) 50 MG tablet Take 1 tablet (50 mg total) by mouth at bedtime. 30 tablet 3  . clotrimazole-betamethasone (LOTRISONE) cream APPLY TO THE AFFECTED TWICE DAILY (Patient taking differently: Apply 1 application topically 2 (two) times daily as needed (affected area). ) 30 g 6  . gabapentin (NEURONTIN) 300 MG capsule 2 capsules in AM, 1 capsule at noon, 2 capsules in PM daily (Patient taking differently: Take 300-600 mg by mouth 3 (three) times daily. 2 capsules in AM, 1 capsule at noon, 2 capsules in PM daily) 150 capsule 3  . insulin aspart (NOVOLOG) 100 UNIT/ML injection Inject 10-17 Units into the skin 3 (three) times daily with meals. Use 10-17 unit 3 times daily before meals (Patient taking differently: Inject 10-17 Units into the skin 3 (three) times daily with meals. Use 10-17 units on a sliding scale. 17 units = 250 and above) 15 mL 4  . levothyroxine (SYNTHROID, LEVOTHROID) 75 MCG tablet Take 1 tablet (75 mcg total) by mouth daily. 90 tablet 2  . nitroGLYCERIN (NITROSTAT) 0.4 MG SL tablet Place 1 tablet (0.4 mg total) under the tongue every 5 (five) minutes as needed (up to 3 doses). 25 tablet 4   No facility-administered medications prior to visit.    Allergies  Allergen Reactions  . Morphine Other (See Comments)     hallucinations  . Shellfish-Derived Products Itching    Review of Systems  Constitutional: Positive for malaise/fatigue. Negative for fever.  HENT: Negative for congestion.   Eyes: Negative for blurred vision.  Respiratory: Negative for shortness of breath.   Cardiovascular: Negative for chest pain, palpitations and leg swelling.  Gastrointestinal: Positive for abdominal pain and diarrhea. Negative for nausea, constipation and blood in stool.  Genitourinary: Negative for dysuria and frequency.  Musculoskeletal: Positive for myalgias and falls.  Skin: Negative for rash.  Neurological: Negative for dizziness, loss of consciousness and headaches.  Endo/Heme/Allergies: Negative for environmental allergies.  Psychiatric/Behavioral: Positive for depression. The patient is nervous/anxious.        Objective:    Physical Exam  Constitutional: He is oriented to person, place, and time. He appears well-developed and well-nourished. No distress.  HENT:  Head: Normocephalic and atraumatic.  Nose: Nose normal.  Eyes: Right eye exhibits no discharge. Left eye exhibits no discharge.  Neck: Normal range of motion. Neck supple.  Cardiovascular: Normal rate and regular rhythm.   No murmur heard. Pulmonary/Chest: Effort normal and breath sounds normal.  Abdominal: Soft. Bowel sounds are normal. He exhibits no mass. There is tenderness. There is no rebound and no guarding.  Musculoskeletal: He exhibits no edema.  Neurological: He is alert and oriented to person, place, and time.  Skin: Skin is warm and dry.  Psychiatric: He has a normal mood and affect.  Nursing note and vitals reviewed.   BP 122/82 mmHg  Pulse 71  Temp(Src) 98 F (36.7 C) (Oral)  Ht 5\' 9"  (1.753 m)  Wt 171 lb 6 oz (77.735 kg)  BMI 25.30 kg/m2  SpO2 97% Wt Readings from Last 3 Encounters:  02/09/16 171 lb 6 oz (77.735 kg)  01/16/16 174 lb 2 oz (78.983 kg)  01/15/16 174 lb (78.926 kg)     Lab Results  Component  Value Date   WBC 9.0 02/09/2016   HGB 14.5 02/09/2016   HCT 41.5 02/09/2016   PLT 198.0 02/09/2016   GLUCOSE 284* 02/09/2016   CHOL 75 02/09/2016   TRIG 244.0* 02/09/2016   HDL 27.20* 02/09/2016   LDLDIRECT 25.0 02/09/2016   LDLCALC 107* 05/01/2015   ALT 26 02/09/2016   AST 17 02/09/2016   NA 137 02/09/2016   K 4.4 02/09/2016   CL 99 02/09/2016   CREATININE 1.06 02/09/2016   BUN 13 02/09/2016   CO2 26 02/09/2016   TSH 4.43 02/09/2016   PSA 2.39 11/25/2010   INR 1.08 11/25/2015   HGBA1C 9.3* 02/09/2016   MICROALBUR 1.1 05/01/2015    Lab Results  Component Value Date   TSH 4.43 02/09/2016   Lab Results  Component Value Date   WBC 9.0 02/09/2016   HGB 14.5 02/09/2016   HCT 41.5 02/09/2016   MCV 89.3 02/09/2016   PLT 198.0 02/09/2016   Lab Results  Component Value Date   NA 137 02/09/2016   K 4.4 02/09/2016   CO2 26 02/09/2016   GLUCOSE 284* 02/09/2016   BUN 13 02/09/2016   CREATININE 1.06 02/09/2016   BILITOT 0.6 02/09/2016   ALKPHOS 73 02/09/2016   AST 17 02/09/2016   ALT 26 02/09/2016   PROT 6.9 02/09/2016   ALBUMIN 4.7 02/09/2016   CALCIUM 9.9 02/09/2016   ANIONGAP 10 11/25/2015   GFR 73.97 02/09/2016   Lab Results  Component Value Date   CHOL 75 02/09/2016   Lab Results  Component Value Date   HDL 27.20* 02/09/2016   Lab Results  Component Value Date   LDLCALC 107* 05/01/2015   Lab Results  Component Value Date   TRIG 244.0* 02/09/2016   Lab Results  Component Value Date   CHOLHDL 3 02/09/2016   Lab Results  Component Value Date   HGBA1C 9.3* 02/09/2016       Assessment & Plan:   Problem List Items Addressed This Visit    Benign prostatic hyperplasia with urinary obstruction    Referred to urology also noted to have hematuria and possible bladder wall thickening.       Chest pain   Relevant Orders   CT Abdomen Pelvis W Contrast (Completed)   Amylase (Completed)   Lipid panel (Completed)   TSH (Completed)   Hemoglobin A1c  (Completed)   CBC (Completed)   Comprehensive metabolic panel (Completed)   Lipase (Completed)   Diabetes mellitus type II, uncontrolled ([place]) - Primary (Chronic)    Had been out of insulin but has Novolin 70/30 now agrees to use it bid and is encouraged to minimize simple carbs.       Relevant Medications   insulin aspart (NOVOLOG) 100 UNIT/ML injection   Other Relevant Orders   CT Abdomen Pelvis W Contrast (Completed)   Amylase (Completed)   Lipid panel (Completed)   TSH (Completed)   Hemoglobin A1c (Completed)   CBC (Completed)   Comprehensive metabolic panel (Completed)   Lipase (Completed)   Diarrhea    With abdominal pain and diarrhea. Labs have not made diagnosis clear. CT abd/pelvis does not show any acute cause of symptoms. Enlarged prostate noted.       Relevant Orders   CT Abdomen Pelvis W Contrast (Completed)   Amylase (Completed)   Lipid panel (Completed)   TSH (Completed)  Hemoglobin A1c (Completed)   CBC (Completed)   Comprehensive metabolic panel (Completed)   Lipase (Completed)   Essential hypertension (Chronic)    Well controlled, no changes to meds. Encouraged heart healthy diet such as the DASH diet and exercise as tolerated.       Relevant Medications   nitroGLYCERIN (NITROSTAT) 0.4 MG SL tablet   Other Relevant Orders   CT Abdomen Pelvis W Contrast (Completed)   Amylase (Completed)   Lipid panel (Completed)   TSH (Completed)   Hemoglobin A1c (Completed)   CBC (Completed)   Comprehensive metabolic panel (Completed)   Lipase (Completed)   HLD (hyperlipidemia)   Relevant Medications   nitroGLYCERIN (NITROSTAT) 0.4 MG SL tablet   Other Relevant Orders   CT Abdomen Pelvis W Contrast (Completed)   Amylase (Completed)   Lipid panel (Completed)   TSH (Completed)   Hemoglobin A1c (Completed)   CBC (Completed)   Comprehensive metabolic panel (Completed)   Lipase (Completed)   Hypothyroidism   Relevant Medications   levothyroxine (SYNTHROID,  LEVOTHROID) 75 MCG tablet   Other Relevant Orders   CT Abdomen Pelvis W Contrast (Completed)   Amylase (Completed)   Lipid panel (Completed)   TSH (Completed)   Hemoglobin A1c (Completed)   CBC (Completed)   Comprehensive metabolic panel (Completed)   Lipase (Completed)    Other Visit Diagnoses    Abdominal pain, unspecified abdominal location        Relevant Orders    CT Abdomen Pelvis W Contrast (Completed)    Amylase (Completed)    Lipid panel (Completed)    TSH (Completed)    Hemoglobin A1c (Completed)    CBC (Completed)    Comprehensive metabolic panel (Completed)    Lipase (Completed)       I am having Mr. Bonifas maintain his topiramate, diclofenac sodium, atorvastatin, metFORMIN, aspirin EC, sertraline, pantoprazole, Insulin Glargine, tiZANidine, insulin glargine, metoprolol tartrate, pramipexole, isosorbide mononitrate, hyoscyamine, Cholecalciferol, ondansetron, clotrimazole-betamethasone, gabapentin, insulin aspart, levothyroxine, nitroGLYCERIN, and glucose blood.  Meds ordered this encounter  Medications  . DISCONTD: glucose blood (ACCU-CHEK AVIVA) test strip    Sig: Use as directed to check blood sugar 4 times daily.  DX E11.9  . clotrimazole-betamethasone (LOTRISONE) cream    Sig: APPLY TO THE AFFECTED TWICE DAILY    Dispense:  30 g    Refill:  6  . gabapentin (NEURONTIN) 300 MG capsule    Sig: 2 capsules in AM, 1 capsule at noon, 2 capsules in PM daily    Dispense:  150 capsule    Refill:  3  . insulin aspart (NOVOLOG) 100 UNIT/ML injection    Sig: Inject 10-17 Units into the skin 3 (three) times daily with meals. Use 10-17 unit 3 times daily before meals    Dispense:  15 mL    Refill:  4  . levothyroxine (SYNTHROID, LEVOTHROID) 75 MCG tablet    Sig: Take 1 tablet (75 mcg total) by mouth daily.    Dispense:  90 tablet    Refill:  2  . nitroGLYCERIN (NITROSTAT) 0.4 MG SL tablet    Sig: Place 1 tablet (0.4 mg total) under the tongue every 5 (five) minutes  as needed (up to 3 doses).    Dispense:  25 tablet    Refill:  4  . glucose blood (ACCU-CHEK AVIVA) test strip    Sig: Use as directed to check blood sugar 4 times daily.  DX E11.9    Dispense:  200 each    Refill:  6     Penni Homans, MD

## 2016-02-10 ENCOUNTER — Ambulatory Visit: Payer: Commercial Managed Care - HMO | Admitting: Family Medicine

## 2016-02-10 LAB — COMPREHENSIVE METABOLIC PANEL
ALT: 26 U/L (ref 0–53)
AST: 17 U/L (ref 0–37)
Albumin: 4.7 g/dL (ref 3.5–5.2)
Alkaline Phosphatase: 73 U/L (ref 39–117)
BILIRUBIN TOTAL: 0.6 mg/dL (ref 0.2–1.2)
BUN: 13 mg/dL (ref 6–23)
CALCIUM: 9.9 mg/dL (ref 8.4–10.5)
CO2: 26 meq/L (ref 19–32)
CREATININE: 1.06 mg/dL (ref 0.40–1.50)
Chloride: 99 mEq/L (ref 96–112)
GFR: 73.97 mL/min (ref >60.00)
GLUCOSE: 284 mg/dL — AB (ref 70–99)
Potassium: 4.4 mEq/L (ref 3.5–5.1)
Sodium: 137 mEq/L (ref 135–145)
Total Protein: 6.9 g/dL (ref 6.0–8.3)

## 2016-02-10 LAB — LDL CHOLESTEROL, DIRECT: LDL DIRECT: 25 mg/dL

## 2016-02-10 LAB — LIPASE: LIPASE: 14 U/L (ref 11.0–59.0)

## 2016-02-10 LAB — CBC
HCT: 41.5 % (ref 39.0–52.0)
Hemoglobin: 14.5 g/dL (ref 13.0–17.0)
MCHC: 35 g/dL (ref 30.0–36.0)
MCV: 89.3 fl (ref 78.0–100.0)
PLATELETS: 198 10*3/uL (ref 150.0–400.0)
RBC: 4.65 Mil/uL (ref 4.22–5.81)
RDW: 13.3 % (ref 11.5–15.5)
WBC: 9 10*3/uL (ref 4.0–10.5)

## 2016-02-10 LAB — TSH: TSH: 4.43 u[IU]/mL (ref 0.35–4.50)

## 2016-02-10 LAB — LIPID PANEL
Cholesterol: 75 mg/dL (ref 0–200)
HDL: 27.2 mg/dL — ABNORMAL LOW (ref >39.00)
NonHDL: 48.13
Total CHOL/HDL Ratio: 3
Triglycerides: 244 mg/dL — ABNORMAL HIGH (ref 0.0–149.0)
VLDL: 48.8 mg/dL — AB (ref 0.0–40.0)

## 2016-02-10 LAB — AMYLASE: Amylase: 22 U/L — ABNORMAL LOW (ref 27–131)

## 2016-02-10 LAB — HEMOGLOBIN A1C: HEMOGLOBIN A1C: 9.3 % — AB (ref 4.6–6.5)

## 2016-02-11 ENCOUNTER — Ambulatory Visit (HOSPITAL_BASED_OUTPATIENT_CLINIC_OR_DEPARTMENT_OTHER)
Admission: RE | Admit: 2016-02-11 | Discharge: 2016-02-11 | Disposition: A | Discharge status: Home / Self Care | Payer: Commercial Managed Care - HMO | Source: Ambulatory Visit | Attending: Family Medicine | Admitting: Family Medicine

## 2016-02-11 ENCOUNTER — Encounter (HOSPITAL_BASED_OUTPATIENT_CLINIC_OR_DEPARTMENT_OTHER): Payer: Self-pay

## 2016-02-11 DIAGNOSIS — R109 Unspecified abdominal pain: Secondary | ICD-10-CM | POA: Insufficient documentation

## 2016-02-11 DIAGNOSIS — I10 Essential (primary) hypertension: Secondary | ICD-10-CM | POA: Insufficient documentation

## 2016-02-11 DIAGNOSIS — R197 Diarrhea, unspecified: Secondary | ICD-10-CM | POA: Insufficient documentation

## 2016-02-11 DIAGNOSIS — I700 Atherosclerosis of aorta: Secondary | ICD-10-CM | POA: Diagnosis not present

## 2016-02-11 DIAGNOSIS — E039 Hypothyroidism, unspecified: Secondary | ICD-10-CM | POA: Diagnosis not present

## 2016-02-11 DIAGNOSIS — E785 Hyperlipidemia, unspecified: Secondary | ICD-10-CM | POA: Diagnosis not present

## 2016-02-11 DIAGNOSIS — R079 Chest pain, unspecified: Secondary | ICD-10-CM

## 2016-02-11 DIAGNOSIS — E1165 Type 2 diabetes mellitus with hyperglycemia: Secondary | ICD-10-CM | POA: Insufficient documentation

## 2016-02-11 MED ORDER — IOHEXOL 300 MG/ML  SOLN
100.0000 mL | Freq: Once | INTRAMUSCULAR | Status: AC | PRN
  Administered 2016-02-11: 100 mL via INTRAVENOUS

## 2016-02-12 ENCOUNTER — Ambulatory Visit: Payer: Commercial Managed Care - HMO | Admitting: Family Medicine

## 2016-02-12 ENCOUNTER — Other Ambulatory Visit: Payer: Self-pay | Admitting: Family Medicine

## 2016-02-12 DIAGNOSIS — R634 Abnormal weight loss: Secondary | ICD-10-CM

## 2016-02-12 DIAGNOSIS — N3289 Other specified disorders of bladder: Secondary | ICD-10-CM

## 2016-02-12 DIAGNOSIS — N400 Enlarged prostate without lower urinary tract symptoms: Secondary | ICD-10-CM

## 2016-02-13 ENCOUNTER — Telehealth: Payer: Self-pay | Admitting: Family Medicine

## 2016-02-13 NOTE — Telephone Encounter (Signed)
Informed the patient of husbands lab results.

## 2016-02-13 NOTE — Telephone Encounter (Signed)
Relationship to patient: Spouse - Santiago Glad   Can be reached: [phone]  Reason for call: Pt's spouse says that she need pt's lab results.

## 2016-02-15 ENCOUNTER — Encounter: Payer: Self-pay | Admitting: Family Medicine

## 2016-02-15 DIAGNOSIS — R197 Diarrhea, unspecified: Secondary | ICD-10-CM | POA: Insufficient documentation

## 2016-02-15 NOTE — Assessment & Plan Note (Signed)
Referred to urology also noted to have hematuria and possible bladder wall thickening.

## 2016-02-15 NOTE — Assessment & Plan Note (Signed)
Had been out of insulin but has Novolin 70/30 now agrees to use it bid and is encouraged to minimize simple carbs.

## 2016-02-15 NOTE — Assessment & Plan Note (Signed)
With abdominal pain and diarrhea. Labs have not made diagnosis clear. CT abd/pelvis does not show any acute cause of symptoms. Enlarged prostate noted.

## 2016-02-15 NOTE — Assessment & Plan Note (Signed)
Well controlled, no changes to meds. Encouraged heart healthy diet such as the DASH diet and exercise as tolerated.  °

## 2016-02-16 ENCOUNTER — Ambulatory Visit (INDEPENDENT_AMBULATORY_CARE_PROVIDER_SITE_OTHER): Payer: Commercial Managed Care - HMO | Admitting: Interventional Cardiology

## 2016-02-16 ENCOUNTER — Encounter: Payer: Self-pay | Admitting: Interventional Cardiology

## 2016-02-16 VITALS — BP 130/70 | HR 65 | Ht 69.0 in | Wt 175.8 lb

## 2016-02-16 DIAGNOSIS — E785 Hyperlipidemia, unspecified: Secondary | ICD-10-CM

## 2016-02-16 DIAGNOSIS — Z951 Presence of aortocoronary bypass graft: Secondary | ICD-10-CM

## 2016-02-16 DIAGNOSIS — I4510 Unspecified right bundle-branch block: Secondary | ICD-10-CM

## 2016-02-16 DIAGNOSIS — I25118 Atherosclerotic heart disease of native coronary artery with other forms of angina pectoris: Secondary | ICD-10-CM | POA: Diagnosis not present

## 2016-02-16 MED ORDER — ASPIRIN EC 81 MG PO TBEC: 81.0000 mg | DELAYED_RELEASE_TABLET | Freq: Every day | ORAL | Status: DC
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
## 2016-02-16 NOTE — Patient Instructions (Signed)
Medication Instructions:  Decrease Aspirin to 81 mg daily  Labwork: None  Testing/Procedures: None  Follow-Up: Your physician wants you to follow-up in: 1 year. You will receive a reminder letter in the mail two months in advance. If you don't receive a letter, please call our office to schedule the follow-up appointment.   If you need a refill on your cardiac medications before your next appointment, please call your pharmacy.

## 2016-03-06 DIAGNOSIS — R55 Syncope and collapse: Secondary | ICD-10-CM

## 2016-03-06 HISTORY — DX: Syncope and collapse: R55

## 2016-03-08 ENCOUNTER — Emergency Department (HOSPITAL_BASED_OUTPATIENT_CLINIC_OR_DEPARTMENT_OTHER)
Admission: EM | Admit: 2016-03-08 | Discharge: 2016-03-08 | Disposition: A | Discharge status: Home / Self Care | Payer: Commercial Managed Care - HMO | Attending: Emergency Medicine | Admitting: Emergency Medicine

## 2016-03-08 ENCOUNTER — Encounter (HOSPITAL_BASED_OUTPATIENT_CLINIC_OR_DEPARTMENT_OTHER): Payer: Self-pay | Admitting: Emergency Medicine

## 2016-03-08 DIAGNOSIS — Z8719 Personal history of other diseases of the digestive system: Secondary | ICD-10-CM | POA: Diagnosis not present

## 2016-03-08 DIAGNOSIS — R251 Tremor, unspecified: Secondary | ICD-10-CM | POA: Insufficient documentation

## 2016-03-08 DIAGNOSIS — Z86018 Personal history of other benign neoplasm: Secondary | ICD-10-CM | POA: Insufficient documentation

## 2016-03-08 DIAGNOSIS — I10 Essential (primary) hypertension: Secondary | ICD-10-CM | POA: Insufficient documentation

## 2016-03-08 DIAGNOSIS — E785 Hyperlipidemia, unspecified: Secondary | ICD-10-CM | POA: Diagnosis not present

## 2016-03-08 DIAGNOSIS — I2510 Atherosclerotic heart disease of native coronary artery without angina pectoris: Secondary | ICD-10-CM | POA: Diagnosis not present

## 2016-03-08 DIAGNOSIS — Z8669 Personal history of other diseases of the nervous system and sense organs: Secondary | ICD-10-CM | POA: Diagnosis not present

## 2016-03-08 DIAGNOSIS — Z794 Long term (current) use of insulin: Secondary | ICD-10-CM | POA: Diagnosis not present

## 2016-03-08 DIAGNOSIS — E119 Type 2 diabetes mellitus without complications: Secondary | ICD-10-CM | POA: Insufficient documentation

## 2016-03-08 DIAGNOSIS — R410 Disorientation, unspecified: Secondary | ICD-10-CM | POA: Diagnosis not present

## 2016-03-08 DIAGNOSIS — E079 Disorder of thyroid, unspecified: Secondary | ICD-10-CM | POA: Diagnosis not present

## 2016-03-08 DIAGNOSIS — Z7984 Long term (current) use of oral hypoglycemic drugs: Secondary | ICD-10-CM | POA: Diagnosis not present

## 2016-03-08 DIAGNOSIS — Z8739 Personal history of other diseases of the musculoskeletal system and connective tissue: Secondary | ICD-10-CM | POA: Insufficient documentation

## 2016-03-08 DIAGNOSIS — Z951 Presence of aortocoronary bypass graft: Secondary | ICD-10-CM | POA: Diagnosis not present

## 2016-03-08 DIAGNOSIS — Z7982 Long term (current) use of aspirin: Secondary | ICD-10-CM | POA: Insufficient documentation

## 2016-03-08 DIAGNOSIS — Z79899 Other long term (current) drug therapy: Secondary | ICD-10-CM | POA: Diagnosis not present

## 2016-03-08 DIAGNOSIS — Z9889 Other specified postprocedural states: Secondary | ICD-10-CM | POA: Diagnosis not present

## 2016-03-08 DIAGNOSIS — Z8673 Personal history of transient ischemic attack (TIA), and cerebral infarction without residual deficits: Secondary | ICD-10-CM | POA: Diagnosis not present

## 2016-03-08 DIAGNOSIS — Z7952 Long term (current) use of systemic steroids: Secondary | ICD-10-CM | POA: Insufficient documentation

## 2016-03-08 DIAGNOSIS — F4489 Other dissociative and conversion disorders: Secondary | ICD-10-CM

## 2016-03-08 DIAGNOSIS — Z9119 Patient's noncompliance with other medical treatment and regimen: Secondary | ICD-10-CM | POA: Diagnosis not present

## 2016-03-08 DIAGNOSIS — F319 Bipolar disorder, unspecified: Secondary | ICD-10-CM | POA: Diagnosis not present

## 2016-03-08 DIAGNOSIS — Z862 Personal history of diseases of the blood and blood-forming organs and certain disorders involving the immune mechanism: Secondary | ICD-10-CM | POA: Insufficient documentation

## 2016-03-08 DIAGNOSIS — R4789 Other speech disturbances: Secondary | ICD-10-CM | POA: Diagnosis present

## 2016-03-08 LAB — CBC WITH DIFFERENTIAL/PLATELET
Basophils Absolute: 0 10*3/uL (ref 0.0–0.1)
Basophils Relative: 0 %
EOS ABS: 0.2 10*3/uL (ref 0.0–0.7)
Eosinophils Relative: 3 %
HCT: 38.3 % — ABNORMAL LOW (ref 39.0–52.0)
HEMOGLOBIN: 13.5 g/dL (ref 13.0–17.0)
LYMPHS ABS: 1.7 10*3/uL (ref 0.7–4.0)
LYMPHS PCT: 26 %
MCH: 31.5 pg (ref 26.0–34.0)
MCHC: 35.2 g/dL (ref 30.0–36.0)
MCV: 89.5 fL (ref 78.0–100.0)
Monocytes Absolute: 0.6 10*3/uL (ref 0.1–1.0)
Monocytes Relative: 9 %
NEUTROS ABS: 4 10*3/uL (ref 1.7–7.7)
NEUTROS PCT: 62 %
Platelets: 148 10*3/uL — ABNORMAL LOW (ref 150–400)
RBC: 4.28 MIL/uL (ref 4.22–5.81)
RDW: 13.2 % (ref 11.5–15.5)
WBC: 6.6 10*3/uL (ref 4.0–10.5)

## 2016-03-08 LAB — SEDIMENTATION RATE: SED RATE: 3 mm/h (ref 0–16)

## 2016-03-08 LAB — COMPREHENSIVE METABOLIC PANEL
ALT: 34 U/L (ref 17–63)
ANION GAP: 8 (ref 5–15)
AST: 26 U/L (ref 15–41)
Albumin: 4.3 g/dL (ref 3.5–5.0)
Alkaline Phosphatase: 82 U/L (ref 38–126)
BUN: 15 mg/dL (ref 6–20)
CHLORIDE: 100 mmol/L — AB (ref 101–111)
CO2: 26 mmol/L (ref 22–32)
Calcium: 9.1 mg/dL (ref 8.9–10.3)
Creatinine, Ser: 1.18 mg/dL (ref 0.61–1.24)
GFR calc non Af Amer: >60 mL/min (ref >60)
Glucose, Bld: 450 mg/dL — ABNORMAL HIGH (ref 65–99)
POTASSIUM: 4.7 mmol/L (ref 3.5–5.1)
SODIUM: 134 mmol/L — AB (ref 135–145)
Total Bilirubin: 0.7 mg/dL (ref 0.3–1.2)
Total Protein: 7.2 g/dL (ref 6.5–8.1)

## 2016-03-08 LAB — URINALYSIS, ROUTINE W REFLEX MICROSCOPIC
Bilirubin Urine: NEGATIVE
Glucose, UA: >1000 mg/dL — AB
HGB URINE DIPSTICK: NEGATIVE
Ketones, ur: NEGATIVE mg/dL
Leukocytes, UA: NEGATIVE
Nitrite: NEGATIVE
Protein, ur: NEGATIVE mg/dL
SPECIFIC GRAVITY, URINE: 1.018 (ref 1.005–1.030)
pH: 6 (ref 5.0–8.0)

## 2016-03-08 LAB — URINE MICROSCOPIC-ADD ON: BACTERIA UA: NONE SEEN

## 2016-03-08 LAB — CBG MONITORING, ED: Glucose-Capillary: 521 mg/dL — ABNORMAL HIGH (ref 65–99)

## 2016-03-08 LAB — TROPONIN I: Troponin I: <0.03 ng/mL (ref <0.031)

## 2016-03-08 MED FILL — SERTRALINE HCL 100 MG TAB: 100 | 90 days supply | Qty: 180 | Fill #1

## 2016-03-08 MED FILL — ATORVASTATIN 40 MG TABLET: 40 | 30 days supply | Qty: 30 | Fill #5

## 2016-03-08 MED FILL — ISOSORBIDE MN ER 60 MG TAB: 60 | 30 days supply | Qty: 60 | Fill #1

## 2016-03-08 MED FILL — PANTOPRAZOLE SOD DR 40 MG T: 40 | 30 days supply | Qty: 45 | Fill #4

## 2016-03-08 MED FILL — metFORMIN HCL 1000 MG TABS: 1000 | 30 days supply | Qty: 60 | Fill #4

## 2016-03-08 MED FILL — RANEXA ER 500 MG TABLET: 500 | 30 days supply | Qty: 60 | Fill #3

## 2016-03-08 MED FILL — PRAMIPEXOLE 1 MG TABLET: 1 | 30 days supply | Qty: 30 | Fill #2

## 2016-03-08 NOTE — ED Notes (Signed)
Patient woke up this am at 915 and was "normal" per wife and got up and started to drive to [place]. The patient while on the way to [place] the patient became acutely sleepy - 11 45  - patient then turned around and went to [place] and got a candy bar and OJ because the patient is a Diabetic. The patient slept form [place] to [place]. He ate his snack at about 130. The patient was slumped over and a child was trying to wake the patient up while he was in car, the patient at 115 started to have slurred speech. The patient then had some staggering and slurred speech is almost gone, he has since eaten another meal and gone to the MD's upstairs.

## 2016-03-08 NOTE — ED Notes (Signed)
MD at bedside. 

## 2016-03-08 NOTE — Discharge Instructions (Signed)
Confusion Confusion is the inability to think with your usual speed or clarity. Confusion may come on quickly or slowly over time. How quickly the confusion comes on depends on the cause. Confusion can be due to any number of causes. CAUSES   Concussion, head injury, or head trauma.  Seizures.  Stroke.  Fever.  Brain tumor.  Age related decreased brain function (dementia).  Heightened emotional states like rage or terror.  Mental illness in which the person loses the ability to determine what is real and what is not (hallucinations).  Infections such as a urinary tract infection (UTI).  Toxic effects from alcohol, drugs, or prescription medicines.  Dehydration and an imbalance of salts in the body (electrolytes).  Lack of sleep.  Low blood sugar (diabetes).  Low levels of oxygen from conditions such as chronic lung disorders.  Drug interactions or other medicine side effects.  Nutritional deficiencies, especially niacin, thiamine, vitamin C, or vitamin B.  Sudden drop in body temperature (hypothermia).  Change in routine, such as when traveling or hospitalized. SIGNS AND SYMPTOMS  People often describe their thinking as cloudy or unclear when they are confused. Confusion can also include feeling disoriented. That means you are unaware of where or who you are. You may also not know what the date or time is. If confused, you may also have difficulty paying attention, remembering, and making decisions. Some people also act aggressively when they are confused.  DIAGNOSIS  The medical evaluation of confusion may include:  Blood and urine tests.  X-rays.  Brain and nervous system tests.  Analyzing your brain waves (electroencephalogram or EEG).  Magnetic resonance imaging (MRI) of your head.  Computed tomography (CT) scan of your head.  Mental status tests in which your health care provider may ask many questions. Some of these questions may seem silly or strange,  but they are a very important test to help diagnose and treat confusion. TREATMENT  An admission to the hospital may not be needed, but a person with confusion should not be left alone. Stay with a family member or friend until the confusion clears. Avoid alcohol, pain relievers, or sedative drugs until you have fully recovered. Do not drive until directed by your health care provider. HOME CARE INSTRUCTIONS  What family and friends can do:  To find out if someone is confused, ask the person to state his or her name, age, and the date. If the person is unsure or answers incorrectly, he or she is confused.  Always introduce yourself, no matter how well the person knows you.  Often remind the person of his or her location.  Place a calendar and clock near the confused person.  Help the person with his or her medicines. You may want to use a pill box, an alarm as a reminder, or give the person each dose as prescribed.  Talk about current events and plans for the day.  Try to keep the environment calm, quiet, and peaceful.  Make sure the person keeps follow-up visits with his or her health care provider. PREVENTION  Ways to prevent confusion:  Avoid alcohol.  Eat a balanced diet.  Get enough sleep.  Take medicine only as directed by your health care provider.  Do not become isolated. Spend time with other people and make plans for your days.  Keep careful watch on your blood sugar levels if you are diabetic. SEEK IMMEDIATE MEDICAL CARE IF:   You develop severe headaches, repeated vomiting, seizures, blackouts, or  slurred speech.  There is increasing confusion, weakness, numbness, restlessness, or personality changes.  You develop a loss of balance, have marked dizziness, feel uncoordinated, or fall.  You have delusions, hallucinations, or develop severe anxiety.  Your family members think you need to be rechecked.   This information is not intended to replace advice given  to you by your health care provider. Make sure you discuss any questions you have with your health care provider.   Document Released: 12/30/2004 Document Revised: 12/13/2014 Document Reviewed: 12/28/2013 Elsevier Interactive Patient Education 2016 [employer]. Suspected episode of Hypoglycemia Hypoglycemia occurs when the glucose in your blood is too low. Glucose is a type of sugar that is your body's main energy source. Hormones, such as insulin and glucagon, control the level of glucose in the blood. Insulin lowers blood glucose and glucagon increases blood glucose. Having too much insulin in your blood stream, or not eating enough food containing sugar, can result in hypoglycemia. Hypoglycemia can happen to people with or without diabetes. It can develop quickly and can be a medical emergency.  CAUSES   Missing or delaying meals.  Not eating enough carbohydrates at meals.  Taking too much diabetes medicine.  Not timing your oral diabetes medicine or insulin doses with meals, snacks, and exercise.  Nausea and vomiting.  Certain medicines.  Severe illnesses, such as hepatitis, kidney disorders, and certain eating disorders.  Increased activity or exercise without eating something extra or adjusting medicines.  Drinking too much alcohol.  A nerve disorder that affects body functions like your heart rate, blood pressure, and digestion (autonomic neuropathy).  A condition where the stomach muscles do not function properly (gastroparesis). Therefore, medicines and food may not absorb properly.  Rarely, a tumor of the pancreas can produce too much insulin. SYMPTOMS   Hunger.  Sweating (diaphoresis).  Change in body temperature.  Shakiness.  Headache.  Anxiety.  Lightheadedness.  Irritability.  Difficulty concentrating.  Dry mouth.  Tingling or numbness in the hands or feet.  Restless sleep or sleep disturbances.  Altered speech and coordination.  Change in  mental status.  Seizures or prolonged convulsions.  Combativeness.  Drowsiness (lethargic).  Weakness.  Increased heart rate or palpitations.  Confusion.  Pale, gray skin color.  Blurred or double vision.  Fainting. DIAGNOSIS  A physical exam and medical history will be performed. Your caregiver may make a diagnosis based on your symptoms. Blood tests and other lab tests may be performed to confirm a diagnosis. Once the diagnosis is made, your caregiver will see if your signs and symptoms go away once your blood glucose is raised.  TREATMENT  Usually, you can easily treat your hypoglycemia when you notice symptoms.  Check your blood glucose. If it is less than 70 mg/dl, take one of the following:   3-4 glucose tablets.    cup juice.    cup regular soda.   1 cup skim milk.   -1 tube of glucose gel.   5-6 hard candies.   Avoid high-fat drinks or food that may delay a rise in blood glucose levels.  Do not take more than the recommended amount of sugary foods, drinks, gel, or tablets. Doing so will cause your blood glucose to go too high.   Wait 10-15 minutes and recheck your blood glucose. If it is still less than 70 mg/dl or below your target range, repeat treatment.   Eat a snack if it is more than 1 hour until your next meal.  There  may be a time when your blood glucose may go so low that you are unable to treat yourself at home when you start to notice symptoms. You may need someone to help you. You may even faint or be unable to swallow. If you cannot treat yourself, someone will need to bring you to the hospital.  [place]  If you have diabetes, follow your diabetes management plan by:  Taking your medicines as directed.  Following your exercise plan.  Following your meal plan. Do not skip meals. Eat on time.  Testing your blood glucose regularly. Check your blood glucose before and after exercise. If you exercise longer or  different than usual, be sure to check blood glucose more frequently.  Wearing your medical alert jewelry that says you have diabetes.  Identify the cause of your hypoglycemia. Then, develop ways to prevent the recurrence of hypoglycemia.  Do not take a hot bath or shower right after an insulin shot.  Always carry treatment with you. Glucose tablets are the easiest to carry.  If you are going to drink alcohol, drink it only with meals.  Tell friends or family members ways to keep you safe during a seizure. This may include removing hard or sharp objects from the area or turning you on your side.  Maintain a healthy weight. SEEK MEDICAL CARE IF:   You are having problems keeping your blood glucose in your target range.  You are having frequent episodes of hypoglycemia.  You feel you might be having side effects from your medicines.  You are not sure why your blood glucose is dropping so low.  You notice a change in vision or a new problem with your vision. SEEK IMMEDIATE MEDICAL CARE IF:   Confusion develops.  A change in mental status occurs.  The inability to swallow develops.  Fainting occurs.   This information is not intended to replace advice given to you by your health care provider. Make sure you discuss any questions you have with your health care provider.   Document Released: 11/22/2005 Document Revised: 11/27/2013 Document Reviewed: 07/29/2015 Elsevier Interactive Patient Education 2016 [employer]. Possible Seizure, Adult Sometimes people have a "focal seizure". This may be localized to one area of the body or only be observable by confusion or inattention. You must follow-up with your neurologist for further assessment. No driving until your neurologist has determine your safe to resume operating an automobile. A seizure is abnormal electrical activity in the brain. Seizures usually last from 30 seconds to 2 minutes. There are various types of  seizures. Before a seizure, you may have a warning sensation (aura) that a seizure is about to occur. An aura may include the following symptoms:   Fear or anxiety.  Nausea.  Feeling like the room is spinning (vertigo).  Vision changes, such as seeing flashing lights or spots. Common symptoms during a seizure include:  A change in attention or behavior (altered mental status).  Convulsions with rhythmic jerking movements.  Drooling.  Rapid eye movements.  Grunting.  Loss of bladder and bowel control.  Bitter taste in the mouth.  Tongue biting. After a seizure, you may feel confused and sleepy. You may also have an injury resulting from convulsions during the seizure. HOME CARE INSTRUCTIONS   If you are given medicines, take them exactly as prescribed by your health care provider.  Keep all follow-up appointments as directed by your health care provider.  Do not swim or drive or engage in  risky activity during which a seizure could cause further injury to you or others until your health care provider says it is OK.  Get adequate rest.  Teach friends and family what to do if you have a seizure. They should:  Lay you on the ground to prevent a fall.  Put a cushion under your head.  Loosen any tight clothing around your neck.  Turn you on your side. If vomiting occurs, this helps keep your airway clear.  Stay with you until you recover.  Know whether or not you need emergency care. SEEK IMMEDIATE MEDICAL CARE IF:  The seizure lasts longer than 5 minutes.  The seizure is severe or you do not wake up immediately after the seizure.  You have an altered mental status after the seizure.  You are having more frequent or worsening seizures. Someone should drive you to the emergency department or call local emergency services (911 in [place] MAKE SURE YOU:  Understand these instructions.  Will watch your condition.  Will get help right away if you are not doing  well or get worse.   This information is not intended to replace advice given to you by your health care provider. Make sure you discuss any questions you have with your health care provider.   Document Released: 11/19/2000 Document Revised: 12/13/2014 Document Reviewed: 07/04/2013 Elsevier Interactive Patient Education [employer].

## 2016-03-08 NOTE — ED Provider Notes (Signed)
CSN: [id number]     Arrival date & time 03/08/16  1535 History  By signing my name below, I, [name] [name], attest that this documentation has been prepared under the direction and in the presence of No att. providers found. Electronically Signed: Doran [name], ED Scribe. 03/09/2016. 10:18 PM.   Chief Complaint  Patient presents with  . Neurologic Problem   The history is provided by the patient and the spouse. No language interpreter was used.   HPI Comments: [name] [name] is a 70 y.o. male who presents to the Emergency Department complaining of speech and gait difficulty that began around noon today. Pt took his insulin last night 10 PM and did not eating anything when he woke up, He took all his medicine at 10:30 AM except his metformin since he did not have any more. Wife was then out with the pt at 11 AM and noted slurred speech, and unsteady gait. Pt did not remembered experiencing any of these symptoms. Pt had steak and salad for lunch. Later, he drank OJ and candy bar at 12:30 PM but the wife states the pt still did not return to baseline. Afterwards, pt and wife came to [facility] for f/u appointment with PCP however their appointment is not until tomorrow. Wife is worried so she brought the pt to the ED for evaluation. Wife states pt is still not at baseline while in the ED . Pt denies any dizziness with positional changes, fevers, chills, or any other symptoms at this time.   Wife also notes she shake the patient excessively to wake him up.   Pt reports episodes of left arm tremors 2-3 times daily that began over a year ago.    Follow up with Neurologist in 2 weeks.  Past Medical History  Diagnosis Date  . Diabetes mellitus type II, uncontrolled ([place])   . OSA (obstructive sleep apnea)   . Hyperlipidemia   . RBBB (right bundle branch block)   . Depression     Bipolar  . Edema   . Hypertension   . Falling episodes     these have occurred in the past and again recurring 2011  .  Spondylosis     C5-6, C6-7 MRI 2010  . CAD (coronary artery disease)     A. CABG in 2000,status post cardiac cath in 2006, 2009 ....continued chest pain and SOB despite oral medication adjestments including Ranexa. B. Cath November 2009/ mRCA - 2.75 x 23 Abbott Xience V drug-eluting stent ...11/26/2008 to distal  RCA leading to acute marginal.  C. Cath 07/2012 for CP - stable anatomy, med rx. d. cath 2015 and 05/30/2015 stable anatomy, consider Myoview if has CP again  . Hx of CABG     2000,  / one median sternotomy suture broken her chest x-ray November, 2010, no clinical significance  . Palpitations     event recorder showed sinus rhythm  . Nephrolithiasis   . Restless leg   . Dizziness   . Shortness of breath     CPX April, 2011, mild functional limitation, no clear pulmonary or cardiac limitation, possible deconditioning and mild chronotropic incompetence( peak heart rate 130)  . Cerebral ischemia     MRI November, 2010, chronic microvascular ischemia  . Anemia     hemoglobin 7.4, iron deficiency, January, 2011, 2 unit transfusion, endoscopy normal, capsule endoscopy February, 2011 no small bowel abnormalities.   Most likely source gastric erosions, followed by GI  . H/O medication noncompliance  Due to loss of insurance  . Ejection fraction     EF 60%, echo, July 31, 2012  . Diabetes mellitus without complication ([place])   . Pain in limb 06/12/2009    Qualifier: Diagnosis of  By: [name]   . Low back pain 06/12/2009    Qualifier: Diagnosis of  By: [name]   . Thyroid disease   . Right knee pain 01/07/2015  . Advance care planning 05/01/2015  . ACP (advance care planning) 05/11/2015  . Nausea with vomiting 01/25/2016  . Headache 01/25/2016  . Diarrhea 02/15/2016  . Gastric ulcer   . Tubulovillous adenoma of colon 2007  . Iron deficiency anemia    Past Surgical History  Procedure Laterality Date  . Nasal septum surgery      UP3  . Coronary artery bypass graft       2000  . Left heart catheterization with coronary/graft angiogram N/A 08/01/2012    Procedure: LEFT HEART CATHETERIZATION WITH Beatrix Fetters;  Surgeon: [name], MD;  Location: [facility] CATH LAB;  Service: Cardiovascular;  Laterality: N/A;  . Left heart catheterization with coronary/graft angiogram N/A 01/03/2015    Procedure: LEFT HEART CATHETERIZATION WITH Beatrix Fetters;  Surgeon: [name], MD;  Location: [facility] CATH LAB;  Service: Cardiovascular;  Laterality: N/A;  . Percutaneous coronary stent intervention (pci-s)  10/2008    mRCA PCI  2.75 x 23 Abbott Xience V drug-eluting stent   . Cardiac catheterization N/A 05/30/2015    Procedure: Left Heart Cath and Coronary Angiography;  Surgeon: [name], MD;  Location: [place] CV LAB;  Service: Cardiovascular;  Laterality: N/A;   Family History  Problem Relation Age of Onset  . Cancer Brother     pancreatic cancer  . Diabetes Brother     4   . Stroke Brother   . Diabetes Brother   . Cancer Brother   . Diabetes Brother   . Coronary artery disease Brother   . Diabetes Brother   . Hypothyroidism Brother   . Heart attack      Nephew  . Diabetes Mother   . Heart failure Mother   . Heart failure Father   . Irregular heart beat Daughter   . Cancer Maternal Grandmother     unknown    Social History  Substance Use Topics  . Smoking status: Never Smoker   . Smokeless tobacco: Never Used  . Alcohol Use: No     Comment: stopped drinking in 1998    Review of Systems  Constitutional: Negative for fever and chills.  Neurological: Positive for tremors and speech difficulty. Negative for dizziness.  All other systems reviewed and are negative.  Allergies  Morphine and Shellfish-derived products  Home Medications   Prior to Admission medications   Medication Sig Start Date End Date Taking? Authorizing Provider  aspirin EC 81 MG tablet Take 1 tablet (81 mg total) by mouth daily. 02/16/16   [name], MD  atorvastatin (LIPITOR) 40 MG tablet Take 1 tablet (40 mg total) by mouth daily at 6 PM. 05/30/15   Almyra Deforest, PA  Cholecalciferol (DIALYVITE VITAMIN D3 MAX) 09811 units TABS Take 1 tablet by mouth once a week. 01/16/16   [name], MD  clotrimazole-betamethasone (LOTRISONE) cream APPLY TO THE AFFECTED TWICE DAILY 02/09/16   [name], MD  diclofenac sodium (VOLTAREN) 1 % GEL Apply 2 g topically 3 (three) times daily as needed. For pain to  affected area 05/01/15   [name], MD  gabapentin (NEURONTIN) 300 MG capsule 2 capsules in AM, 1 capsule at noon, 2 capsules in PM daily 02/09/16   [name], MD  glucose blood (ACCU-CHEK AVIVA) test strip Use as directed to check blood sugar 4 times daily.  DX E11.9 02/09/16   [name], MD  hyoscyamine (ANASPAZ) 0.125 MG TBDP disintergrating tablet Place 1 tablet (0.125 mg total) under the tongue every 4 (four) hours as needed for cramping. 01/16/16   [name], MD  insulin aspart (NOVOLOG) 100 UNIT/ML injection Inject 10-17 Units into the skin 3 (three) times daily with meals. Use 10-17 unit 3 times daily before meals 02/09/16   [name], MD  Insulin Glargine (TOUJEO SOLOSTAR) 300 UNIT/ML SOPN Inject 42 Units into the skin at bedtime. 09/16/15   [name], MD  isosorbide mononitrate (IMDUR) 120 MG 24 hr tablet Take 1 tablet (120 mg total) by mouth daily. 12/19/15   [name], MD  levothyroxine (SYNTHROID, LEVOTHROID) 75 MCG tablet Take 1 tablet (75 mcg total) by mouth daily. 02/09/16   [name], MD  metFORMIN (GLUCOPHAGE) 1000 MG tablet Take 1 tablet (1,000 mg total) by mouth 2 (two) times daily with a meal. 06/12/15   [name], MD  metoprolol (LOPRESSOR) 25 MG tablet Take 0.5 tablets (12.5 mg total) by mouth 2 (two) times daily. 11/25/15   Noemi Chapel, MD  nitroGLYCERIN (NITROSTAT) 0.4 MG SL tablet Place 1 tablet (0.4 mg total) under the tongue every 5 (five) minutes as needed (up to 3 doses). 02/09/16   [name], MD  ondansetron (ZOFRAN-ODT) 4 MG disintegrating tablet Take 1 tablet (4 mg total) by mouth every 8 (eight) hours as needed for nausea or vomiting. 01/16/16   [name], MD  pantoprazole (PROTONIX) 40 MG tablet Take 40 mg by mouth daily.    Historical Provider, MD  pramipexole (MIRAPEX) 0.5 MG tablet Take 1-2 tablets (0.5-1 mg total) by mouth at bedtime. 12/19/15   [name], MD  sertraline (ZOLOFT) 100 MG tablet Take 1 tablet (100 mg total) by mouth 2 (two) times daily. 07/24/15   [name], MD  tiZANidine (ZANAFLEX) 4 MG tablet Take 1 tablet (4 mg total) by mouth every 8 (eight) hours as needed for muscle spasms. 11/19/15   Pieter Partridge, DO  topiramate (TOPAMAX) 50 MG tablet Take 1 tablet (50 mg total) by mouth at bedtime. 04/25/15   Pieter Partridge, DO   BP 159/65 mmHg  Pulse 53  Temp(Src) 98.5 F (36.9 C) (Oral)  Resp 18  Ht 5\' 9"  (1.753 m)  Wt 174 lb (78.926 kg)  BMI 25.68 kg/m2  SpO2 99%   Physical Exam  Constitutional: He is oriented to person, place, and time. He appears well-developed and well-nourished.  HENT:  Head: Normocephalic and atraumatic.  Mouth/Throat: Oropharynx is clear and moist. No oropharyngeal exudate, posterior oropharyngeal edema or posterior oropharyngeal erythema.  Eyes: Conjunctivae and EOM are normal. Pupils are equal, round, and reactive to light.  Normal consensual reaction   Neck: Normal range of motion. Neck supple.  Cardiovascular: Normal rate, regular rhythm, normal heart sounds and intact distal pulses.  Exam reveals no gallop and no friction rub.   No murmur heard. Pulmonary/Chest: Effort normal and breath sounds normal. He has no wheezes. He has no rhonchi. He has no rales.  Abdominal: Soft. There is no tenderness.  Musculoskeletal: Normal  range of motion. He exhibits no edema or tenderness.  Neurological: He is alert and oriented to person, place, and time. He has normal reflexes. He displays normal reflexes. No cranial nerve  deficit. He exhibits normal muscle tone. Coordination normal.  Cranial nerves are symmetrical  Skin: Skin is warm and dry.    ED Course  Procedures   DIAGNOSTIC STUDIES: Oxygen Saturation is 99% on room air, normal by my interpretation.    COORDINATION OF CARE: 5:49 PM Will order blood work, EKG, and Urinalysis. Discussed treatment plan with pt and wife at bedside and they agreed to plan.   Labs Review Labs Reviewed  COMPREHENSIVE METABOLIC PANEL - Abnormal; Notable for the following:    Sodium 134 (*)    Chloride 100 (*)    Glucose, Bld 450 (*)    All other components within normal limits  CBC WITH DIFFERENTIAL/PLATELET - Abnormal; Notable for the following:    HCT 38.3 (*)    Platelets 148 (*)    All other components within normal limits  URINALYSIS, ROUTINE W REFLEX MICROSCOPIC (NOT AT [facility]) - Abnormal; Notable for the following:    Glucose, UA >1000 (*)    All other components within normal limits  URINE MICROSCOPIC-ADD ON - Abnormal; Notable for the following:    Squamous Epithelial / LPF 0-5 (*)    All other components within normal limits  CBG MONITORING, ED - Abnormal; Notable for the following:    Glucose-Capillary 521 (*)    All other components within normal limits  TROPONIN I  SEDIMENTATION RATE    Imaging Review No results found. I have personally reviewed and evaluated these images and lab results as part of my medical decision-making.   EKG Interpretation   Date/Time:  Monday March 08 2016 18:25:59 EDT Ventricular Rate:  51 PR Interval:  193 QRS Duration: 121 QT Interval:  471 QTC Calculation: 434 R Axis:   0 Text Interpretation:  Sinus rhythm Right bundle branch block ED PHYSICIAN  INTERPRETATION AVAILABLE IN [facility] Confirmed by TEST, Record  ([id number]) on 03/09/2016 6:51:18 AM      MDM   Final diagnoses:  Confusion state   Patient experienced an episode of confusion and acute somnolence. He had taken his Lantus tonight before and  not eaten prior to going to his family 75 office. His wife describes a prolonged episode of symptoms. He did however eat a candy bar and some juice and then subsequently ate a very large meal of steak and potatoes and salad. Symptoms had resolved by the time he is seen in the emergency department. There is no neurologic deficit present. Patient's cognitive function is intact. By description, there was not localizing motor deficit. Patient describes episodes over the past number of months of left arm tremor that has become more frequent. Also described a certain episodes of decreased cognitive function. These are apparently episodic. I most suspected the patient had hypoglycemia from not having eaten and having had his Lantus. Secondary consideration is for possible partial seizure type presentation. Patient describes a head injury about 6 months ago. He had MRIs since then without acute findings. Recommendation is for close follow-up with the PCP and with neurology. Return signs and symptoms are reviewed.  Charlesetta Shanks, MD 03/09/16 2221

## 2016-03-09 ENCOUNTER — Observation Stay (HOSPITAL_BASED_OUTPATIENT_CLINIC_OR_DEPARTMENT_OTHER)
Admission: EM | Admit: 2016-03-09 | Discharge: 2016-03-11 | Disposition: A | Discharge status: Home / Self Care | Payer: Commercial Managed Care - HMO | Source: Home / Self Care | Attending: Emergency Medicine | Admitting: Emergency Medicine

## 2016-03-09 ENCOUNTER — Encounter (HOSPITAL_COMMUNITY): Payer: Self-pay | Admitting: Emergency Medicine

## 2016-03-09 ENCOUNTER — Encounter: Payer: Self-pay | Admitting: Family Medicine

## 2016-03-09 ENCOUNTER — Ambulatory Visit (INDEPENDENT_AMBULATORY_CARE_PROVIDER_SITE_OTHER): Payer: Commercial Managed Care - HMO | Admitting: Family Medicine

## 2016-03-09 VITALS — BP 132/71 | HR 65 | Temp 98.7°F | Ht 69.0 in | Wt 180.2 lb

## 2016-03-09 DIAGNOSIS — Z9119 Patient's noncompliance with other medical treatment and regimen: Secondary | ICD-10-CM

## 2016-03-09 DIAGNOSIS — R55 Syncope and collapse: Secondary | ICD-10-CM

## 2016-03-09 DIAGNOSIS — E079 Disorder of thyroid, unspecified: Secondary | ICD-10-CM | POA: Insufficient documentation

## 2016-03-09 DIAGNOSIS — I10 Essential (primary) hypertension: Secondary | ICD-10-CM | POA: Diagnosis not present

## 2016-03-09 DIAGNOSIS — K259 Gastric ulcer, unspecified as acute or chronic, without hemorrhage or perforation: Secondary | ICD-10-CM

## 2016-03-09 DIAGNOSIS — K219 Gastro-esophageal reflux disease without esophagitis: Secondary | ICD-10-CM | POA: Diagnosis present

## 2016-03-09 DIAGNOSIS — G4733 Obstructive sleep apnea (adult) (pediatric): Secondary | ICD-10-CM | POA: Insufficient documentation

## 2016-03-09 DIAGNOSIS — E1100 Type 2 diabetes mellitus with hyperosmolarity without nonketotic hyperglycemic-hyperosmolar coma (NKHHC): Secondary | ICD-10-CM

## 2016-03-09 DIAGNOSIS — R4182 Altered mental status, unspecified: Secondary | ICD-10-CM | POA: Diagnosis not present

## 2016-03-09 DIAGNOSIS — I779 Disorder of arteries and arterioles, unspecified: Secondary | ICD-10-CM | POA: Diagnosis present

## 2016-03-09 DIAGNOSIS — E1143 Type 2 diabetes mellitus with diabetic autonomic (poly)neuropathy: Secondary | ICD-10-CM | POA: Diagnosis not present

## 2016-03-09 DIAGNOSIS — E038 Other specified hypothyroidism: Secondary | ICD-10-CM | POA: Diagnosis not present

## 2016-03-09 DIAGNOSIS — D649 Anemia, unspecified: Secondary | ICD-10-CM | POA: Diagnosis present

## 2016-03-09 DIAGNOSIS — I5032 Chronic diastolic (congestive) heart failure: Secondary | ICD-10-CM | POA: Insufficient documentation

## 2016-03-09 DIAGNOSIS — R410 Disorientation, unspecified: Secondary | ICD-10-CM

## 2016-03-09 DIAGNOSIS — I4510 Unspecified right bundle-branch block: Secondary | ICD-10-CM

## 2016-03-09 DIAGNOSIS — E1165 Type 2 diabetes mellitus with hyperglycemia: Secondary | ICD-10-CM

## 2016-03-09 DIAGNOSIS — R401 Stupor: Secondary | ICD-10-CM

## 2016-03-09 DIAGNOSIS — G2581 Restless legs syndrome: Secondary | ICD-10-CM | POA: Insufficient documentation

## 2016-03-09 DIAGNOSIS — I739 Peripheral vascular disease, unspecified: Secondary | ICD-10-CM

## 2016-03-09 DIAGNOSIS — E785 Hyperlipidemia, unspecified: Secondary | ICD-10-CM

## 2016-03-09 DIAGNOSIS — K635 Polyp of colon: Secondary | ICD-10-CM | POA: Insufficient documentation

## 2016-03-09 DIAGNOSIS — F329 Major depressive disorder, single episode, unspecified: Secondary | ICD-10-CM | POA: Insufficient documentation

## 2016-03-09 DIAGNOSIS — Z951 Presence of aortocoronary bypass graft: Secondary | ICD-10-CM | POA: Insufficient documentation

## 2016-03-09 DIAGNOSIS — F418 Other specified anxiety disorders: Secondary | ICD-10-CM | POA: Diagnosis present

## 2016-03-09 DIAGNOSIS — I6782 Cerebral ischemia: Secondary | ICD-10-CM | POA: Insufficient documentation

## 2016-03-09 DIAGNOSIS — D509 Iron deficiency anemia, unspecified: Secondary | ICD-10-CM

## 2016-03-09 DIAGNOSIS — R42 Dizziness and giddiness: Secondary | ICD-10-CM

## 2016-03-09 DIAGNOSIS — Z794 Long term (current) use of insulin: Secondary | ICD-10-CM

## 2016-03-09 DIAGNOSIS — I2510 Atherosclerotic heart disease of native coronary artery without angina pectoris: Secondary | ICD-10-CM

## 2016-03-09 DIAGNOSIS — E119 Type 2 diabetes mellitus without complications: Secondary | ICD-10-CM

## 2016-03-09 DIAGNOSIS — R739 Hyperglycemia, unspecified: Secondary | ICD-10-CM

## 2016-03-09 HISTORY — DX: Syncope and collapse: R55

## 2016-03-09 LAB — CBC WITH DIFFERENTIAL/PLATELET
BASOS PCT: 0 %
Basophils Absolute: 0 10*3/uL (ref 0.0–0.1)
EOS ABS: 0.2 10*3/uL (ref 0.0–0.7)
EOS PCT: 2 %
HCT: 34 % — ABNORMAL LOW (ref 39.0–52.0)
Hemoglobin: 12.2 g/dL — ABNORMAL LOW (ref 13.0–17.0)
LYMPHS ABS: 2 10*3/uL (ref 0.7–4.0)
Lymphocytes Relative: 25 %
MCH: 31.7 pg (ref 26.0–34.0)
MCHC: 35.9 g/dL (ref 30.0–36.0)
MCV: 88.3 fL (ref 78.0–100.0)
MONOS PCT: 8 %
Monocytes Absolute: 0.6 10*3/uL (ref 0.1–1.0)
NEUTROS PCT: 65 %
Neutro Abs: 5.2 10*3/uL (ref 1.7–7.7)
PLATELETS: 144 10*3/uL — AB (ref 150–400)
RBC: 3.85 MIL/uL — AB (ref 4.22–5.81)
RDW: 13.2 % (ref 11.5–15.5)
WBC: 8 10*3/uL (ref 4.0–10.5)

## 2016-03-09 LAB — COMPREHENSIVE METABOLIC PANEL
ALK PHOS: 89 U/L (ref 38–126)
ALT: 29 U/L (ref 17–63)
ANION GAP: 12 (ref 5–15)
AST: 23 U/L (ref 15–41)
Albumin: 3.5 g/dL (ref 3.5–5.0)
BILIRUBIN TOTAL: 0.8 mg/dL (ref 0.3–1.2)
BUN: 15 mg/dL (ref 6–20)
CALCIUM: 8.6 mg/dL — AB (ref 8.9–10.3)
CO2: 17 mmol/L — AB (ref 22–32)
CREATININE: 1.25 mg/dL — AB (ref 0.61–1.24)
Chloride: 105 mmol/L (ref 101–111)
GFR calc non Af Amer: 58 mL/min — ABNORMAL LOW (ref >60)
Glucose, Bld: 445 mg/dL — ABNORMAL HIGH (ref 65–99)
Potassium: 4.4 mmol/L (ref 3.5–5.1)
SODIUM: 134 mmol/L — AB (ref 135–145)
TOTAL PROTEIN: 5.7 g/dL — AB (ref 6.5–8.1)

## 2016-03-09 LAB — CBG MONITORING, ED: GLUCOSE-CAPILLARY: 422 mg/dL — AB (ref 65–99)

## 2016-03-09 LAB — TROPONIN I: Troponin I: <0.03 ng/mL (ref <0.031)

## 2016-03-09 MED ORDER — SODIUM CHLORIDE 0.9 % IV BOLUS (SEPSIS)
500.0000 mL | Freq: Once | INTRAVENOUS | Status: AC
  Administered 2016-03-09: 500 mL via INTRAVENOUS

## 2016-03-09 MED ORDER — SODIUM CHLORIDE 0.9 % IV SOLN
INTRAVENOUS | Status: DC
  Administered 2016-03-10 (×2): via INTRAVENOUS

## 2016-03-09 MED ORDER — INSULIN ASPART 100 UNIT/ML ~~LOC~~ SOLN
15.0000 [IU] | Freq: Once | SUBCUTANEOUS | Status: AC
  Administered 2016-03-09: 15 [IU] via SUBCUTANEOUS
  Filled 2016-03-09: qty 1

## 2016-03-09 MED FILL — ACCU-CHEK AVIVA PLUS TEST S: 20 days supply | Qty: 100 | Fill #1

## 2016-03-09 NOTE — Consult Note (Signed)
NEURO HOSPITALIST CONSULT NOTE      Reason for Consult:episodes of presyncope, confusion vs seizure    History obtained from:  Patient     HPI:                                                                                                                                          [name] [name] is an 70 y.o. male is presenting after experiencing episodes which he describes as presyncope in which he feels like he is going to faint but doesn't, he gets unsteady on his feet and developes slurry speech. At times he gets confused after these episodes and today it took him 48min to get back to his baseline. Denies any weakness, headache, visual symptoms   Past Medical History  Diagnosis Date  . Diabetes mellitus type II, uncontrolled ([place])   . OSA (obstructive sleep apnea)   . Hyperlipidemia   . RBBB (right bundle branch block)   . Depression     Bipolar  . Edema   . Hypertension   . Falling episodes     these have occurred in the past and again recurring 2011  . Spondylosis     C5-6, C6-7 MRI 2010  . CAD (coronary artery disease)     A. CABG in 2000,status post cardiac cath in 2006, 2009 ....continued chest pain and SOB despite oral medication adjestments including Ranexa. B. Cath November 2009/ mRCA - 2.75 x 23 Abbott Xience V drug-eluting stent ...11/26/2008 to distal  RCA leading to acute marginal.  C. Cath 07/2012 for CP - stable anatomy, med rx. d. cath 2015 and 05/30/2015 stable anatomy, consider Myoview if has CP again  . Hx of CABG     2000,  / one median sternotomy suture broken her chest x-ray November, 2010, no clinical significance  . Palpitations     event recorder showed sinus rhythm  . Nephrolithiasis   . Restless leg   . Dizziness   . Shortness of breath     CPX April, 2011, mild functional limitation, no clear pulmonary or cardiac limitation, possible deconditioning and mild chronotropic incompetence( peak heart rate 130)  . Cerebral ischemia      MRI November, 2010, chronic microvascular ischemia  . Anemia     hemoglobin 7.4, iron deficiency, January, 2011, 2 unit transfusion, endoscopy normal, capsule endoscopy February, 2011 no small bowel abnormalities.   Most likely source gastric erosions, followed by GI  . H/O medication noncompliance     Due to loss of insurance  . Ejection fraction     EF 60%, echo, July 31, 2012  . Diabetes mellitus without complication ([place])   . Pain in limb 06/12/2009    Qualifier: Diagnosis of  By: [name]   .  Low back pain 06/12/2009    Qualifier: Diagnosis of  By: [name]   . Thyroid disease   . Right knee pain 01/07/2015  . Advance care planning 05/01/2015  . ACP (advance care planning) 05/11/2015  . Nausea with vomiting 01/25/2016  . Headache 01/25/2016  . Diarrhea 02/15/2016  . Gastric ulcer   . Tubulovillous adenoma of colon 2007  . Iron deficiency anemia     Past Surgical History  Procedure Laterality Date  . Nasal septum surgery      UP3  . Coronary artery bypass graft      2000  . Left heart catheterization with coronary/graft angiogram N/A 08/01/2012    Procedure: LEFT HEART CATHETERIZATION WITH Beatrix Fetters;  Surgeon: [name], MD;  Location: [facility] CATH LAB;  Service: Cardiovascular;  Laterality: N/A;  . Left heart catheterization with coronary/graft angiogram N/A 01/03/2015    Procedure: LEFT HEART CATHETERIZATION WITH Beatrix Fetters;  Surgeon: [name], MD;  Location: [facility] CATH LAB;  Service: Cardiovascular;  Laterality: N/A;  . Percutaneous coronary stent intervention (pci-s)  10/2008    mRCA PCI  2.75 x 23 Abbott Xience V drug-eluting stent   . Cardiac catheterization N/A 05/30/2015    Procedure: Left Heart Cath and Coronary Angiography;  Surgeon: [name], MD;  Location: [place] CV LAB;  Service: Cardiovascular;  Laterality: N/A;    Family History  Problem Relation Age of Onset  . Cancer Brother     pancreatic cancer   . Diabetes Brother     4   . Stroke Brother   . Diabetes Brother   . Cancer Brother   . Diabetes Brother   . Coronary artery disease Brother   . Diabetes Brother   . Hypothyroidism Brother   . Heart attack      Nephew  . Diabetes Mother   . Heart failure Mother   . Heart failure Father   . Irregular heart beat Daughter   . Cancer Maternal Grandmother     unknown       Social History:  reports that he has never smoked. He has never used smokeless tobacco. He reports that he does not drink alcohol or use illicit drugs.  Allergies  Allergen Reactions  . Morphine Other (See Comments)    hallucinations  . Shellfish-Derived Products Itching    MEDICATIONS:                                                                                                                     I have reviewed the patient's current medications.   ROS:  History obtained from the patient  General ROS: negative for - chills, fatigue, fever, night sweats, weight gain or weight loss Psychological ROS: negative for - behavioral disorder, hallucinations, memory difficulties, mood swings or suicidal ideation Ophthalmic ROS: negative for - blurry vision, double vision, eye pain or loss of vision ENT ROS: negative for - epistaxis, nasal discharge, oral lesions, sore throat, tinnitus or vertigo Allergy and Immunology ROS: negative for - hives or itchy/watery eyes Hematological and Lymphatic ROS: negative for - bleeding problems, bruising or swollen lymph nodes Endocrine ROS: negative for - galactorrhea, hair pattern changes, polydipsia/polyuria or temperature intolerance Respiratory ROS: negative for - cough, hemoptysis, shortness of breath or wheezing Cardiovascular ROS: negative for - chest pain, dyspnea on exertion, edema or irregular heartbeat Gastrointestinal ROS: negative  for - abdominal pain, diarrhea, hematemesis, nausea/vomiting or stool incontinence Genito-Urinary ROS: negative for - dysuria, hematuria, incontinence or urinary frequency/urgency Musculoskeletal ROS: negative for - joint swelling or muscular weakness Neurological ROS: as noted in HPI Dermatological ROS: negative for rash and skin lesion changes   Blood pressure 104/54, pulse 60, temperature 97.6 F (36.4 C), temperature source Oral, resp. rate 13, height 5\' 9"  (1.753 m), weight 81.647 kg (180 lb), SpO2 97 %.   Neurologic Examination:                                                                                                      HEENT-  Normocephalic, no lesions, without obvious abnormality.  Normal external eye and conjunctiva.  Normal TM's bilaterally.  Normal auditory canals and external ears. Normal external nose, mucus membranes and septum.  Normal pharynx. Cardiovascular- regular rate and rhythm, S1, S2 normal, no murmur, click, rub or gallop, pulses palpable throughout   Lungs- chest clear, no wheezing, rales, normal symmetric air entry, Heart exam - S1, S2 normal, no murmur, no gallop, rate regular Abdomen- soft, non-tender; bowel sounds normal; no masses,  no organomegaly Extremities- no edema   Neurological Examination Mental Status: Alert, oriented, thought content appropriate.  Speech fluent without evidence of aphasia.  Able to follow 3 step commands without difficulty. Cranial Nerves: II: Discs flat bilaterally; Visual fields grossly normal, pupils equal, round, reactive to light and accommodation III,IV, VI: ptosis not present, extra-ocular motions intact bilaterally V,VII: smile symmetric, facial light touch sensation normal bilaterally VIII: hearing normal bilaterally IX,X: uvula rises symmetrically XI: bilateral shoulder shrug XII: midline tongue extension Motor: Right : Upper extremity   5/5    Left:     Upper extremity   5/5  Lower extremity   5/5     Lower  extremity   5/5 Tone and bulk:normal tone throughout; no atrophy noted Sensory: Pinprick and light touch intact throughout, bilaterally Plantars: Right: downgoing   Left: downgoing Cerebellar: normal finger-to-nose, normal rapid alternating movements and normal heel-to-shin test       Lab Results: Basic Metabolic Panel:  Recent Labs Lab 03/08/16 1728 03/09/16 2036  NA 134* 134*  K 4.7 4.4  CL 100* 105  CO2 26 17*  GLUCOSE 450* 445*  BUN 15 15  CREATININE 1.18 1.25*  CALCIUM 9.1 8.6*    Liver Function Tests:  Recent Labs Lab 03/08/16 1728 03/09/16 2036  AST 26 23  ALT 34 29  ALKPHOS 82 89  BILITOT 0.7 0.8  PROT 7.2 5.7*  ALBUMIN 4.3 3.5   No results for input(s): LIPASE, AMYLASE in the last 168 hours. No results for input(s): AMMONIA in the last 168 hours.  CBC:  Recent Labs Lab 03/08/16 1728 03/09/16 2036  WBC 6.6 8.0  NEUTROABS 4.0 5.2  HGB 13.5 12.2*  HCT 38.3* 34.0*  MCV 89.5 88.3  PLT 148* 144*    Cardiac Enzymes:  Recent Labs Lab 03/08/16 1728 03/09/16 2036  TROPONINI <0.03 <0.03    Lipid Panel: No results for input(s): CHOL, TRIG, HDL, CHOLHDL, VLDL, LDLCALC in the last 168 hours.  CBG:  Recent Labs Lab 03/08/16 1555 03/09/16 2000  GLUCAP 521* 422*    Microbiology: Results for orders placed or performed in visit on 01/15/16  Urine culture     Status: None   Collection Time: 01/15/16  3:10 PM  Result Value Ref Range Status   Colony Count 3,000 COLONIES/ML  Final   Organism ID, Bacteria Insignificant Growth  Final    Coagulation Studies: No results for input(s): LABPROT, INR in the last 72 hours.  Imaging: No results found.      Assessment/Plan:  70 y.o. male is presenting after experiencing episodes which he describes as presyncope in which he feels like he is going to faint but doesn't, he gets unsteady on his feet and developes slurry speech. At times he gets confused after these episodes and today it took him  36min to get back to his baseline. Denies any weakness, headache, visual symptoms   Transient episodes of altered mental status Presyncope/syncope vs seizure   Unclear if these episodes are vascular or ictal in nature, will need the below workup and based on findings can come with a better conclusion.  - Would recommend Echo and holter monitoring to rule our arhythmias - MRI brain with and without contrast - EEG - CTA head and neck

## 2016-03-09 NOTE — ED Notes (Signed)
Pt was at home experiencing dizziness and weakness upon standing and hyperglycemia.  CBG was 485 in the field, 12 lead was "unremarkable w/ exception of bundle block." He was given 931ml ns in the field and last bp was 90/58.  He was seen at [facility] yesterday for the same thing.

## 2016-03-09 NOTE — Patient Instructions (Signed)

## 2016-03-09 NOTE — Progress Notes (Signed)
Pre visit review using our clinic review tool, if applicable. No additional management support is needed unless otherwise documented below in the visit note. 

## 2016-03-09 NOTE — ED Provider Notes (Signed)
CSN: [id number]     Arrival date & time 03/09/16  1946 History   First MD Initiated Contact with Patient 03/09/16 2010     Chief Complaint  Patient presents with  . Hypotension  . Hyperglycemia     (Consider location/radiation/quality/duration/timing/severity/associated sxs/prior Treatment) HPI Patient was seen by myself yesterday for an episode of change in mental status. He and his wife report he had another episode today. She reports they were driving in the car at about 5:45 and the patient just slumped to the side and did not awaken. Patient remained poorly responsive for about 30 minutes. He does not recall the event. EMS evaluated the patient and found that his blood pressures were slightly low, 98/54. The patient did not have focal neurologic deficit. At this time patient does not have headache, visual change, focal weakness numbness or tingling. He did not have associated chest pain or shortness of breath. Past Medical History  Diagnosis Date  . Diabetes mellitus type II, uncontrolled ([place])   . OSA (obstructive sleep apnea)   . Hyperlipidemia   . RBBB (right bundle branch block)   . Depression     Bipolar  . Edema   . Hypertension   . Falling episodes     these have occurred in the past and again recurring 2011  . Spondylosis     C5-6, C6-7 MRI 2010  . CAD (coronary artery disease)     A. CABG in 2000,status post cardiac cath in 2006, 2009 ....continued chest pain and SOB despite oral medication adjestments including Ranexa. B. Cath November 2009/ mRCA - 2.75 x 23 Abbott Xience V drug-eluting stent ...11/26/2008 to distal  RCA leading to acute marginal.  C. Cath 07/2012 for CP - stable anatomy, med rx. d. cath 2015 and 05/30/2015 stable anatomy, consider Myoview if has CP again  . Hx of CABG     2000,  / one median sternotomy suture broken her chest x-ray November, 2010, no clinical significance  . Palpitations     event recorder showed sinus rhythm  . Nephrolithiasis   .  Restless leg   . Dizziness   . Shortness of breath     CPX April, 2011, mild functional limitation, no clear pulmonary or cardiac limitation, possible deconditioning and mild chronotropic incompetence( peak heart rate 130)  . Cerebral ischemia     MRI November, 2010, chronic microvascular ischemia  . Anemia     hemoglobin 7.4, iron deficiency, January, 2011, 2 unit transfusion, endoscopy normal, capsule endoscopy February, 2011 no small bowel abnormalities.   Most likely source gastric erosions, followed by GI  . H/O medication noncompliance     Due to loss of insurance  . Ejection fraction     EF 60%, echo, July 31, 2012  . Diabetes mellitus without complication ([place])   . Pain in limb 06/12/2009    Qualifier: Diagnosis of  By: [name]   . Low back pain 06/12/2009    Qualifier: Diagnosis of  By: [name]   . Thyroid disease   . Right knee pain 01/07/2015  . Advance care planning 05/01/2015  . ACP (advance care planning) 05/11/2015  . Nausea with vomiting 01/25/2016  . Headache 01/25/2016  . Diarrhea 02/15/2016  . Gastric ulcer   . Tubulovillous adenoma of colon 2007  . Iron deficiency anemia    Past Surgical History  Procedure Laterality Date  . Nasal septum surgery      UP3  . Coronary  artery bypass graft      2000  . Left heart catheterization with coronary/graft angiogram N/A 08/01/2012    Procedure: LEFT HEART CATHETERIZATION WITH Beatrix Fetters;  Surgeon: [name], MD;  Location: [facility] CATH LAB;  Service: Cardiovascular;  Laterality: N/A;  . Left heart catheterization with coronary/graft angiogram N/A 01/03/2015    Procedure: LEFT HEART CATHETERIZATION WITH Beatrix Fetters;  Surgeon: [name], MD;  Location: [facility] CATH LAB;  Service: Cardiovascular;  Laterality: N/A;  . Percutaneous coronary stent intervention (pci-s)  10/2008    mRCA PCI  2.75 x 23 Abbott Xience V drug-eluting stent   . Cardiac catheterization N/A 05/30/2015     Procedure: Left Heart Cath and Coronary Angiography;  Surgeon: [name], MD;  Location: [facility] CV LAB;  Service: Cardiovascular;  Laterality: N/A;   Family History  Problem Relation Age of Onset  . Cancer Brother     pancreatic cancer  . Diabetes Brother     4   . Stroke Brother   . Diabetes Brother   . Cancer Brother   . Diabetes Brother   . Coronary artery disease Brother   . Diabetes Brother   . Hypothyroidism Brother   . Heart attack      Nephew  . Diabetes Mother   . Heart failure Mother   . Heart failure Father   . Irregular heart beat Daughter   . Cancer Maternal Grandmother     unknown    Social History  Substance Use Topics  . Smoking status: Never Smoker   . Smokeless tobacco: Never Used  . Alcohol Use: No     Comment: stopped drinking in 1998    Review of Systems  10 Systems reviewed and are negative for acute change except as noted in the HPI.   Allergies  Morphine and Shellfish-derived products  Home Medications   Prior to Admission medications   Medication Sig Start Date End Date Taking? Authorizing Provider  aspirin EC 81 MG tablet Take 1 tablet (81 mg total) by mouth daily. 02/16/16   [name], MD  atorvastatin (LIPITOR) 40 MG tablet Take 1 tablet (40 mg total) by mouth daily at 6 PM. 05/30/15   Almyra Deforest, PA  Cholecalciferol (DIALYVITE VITAMIN D3 MAX) 60454 units TABS Take 1 tablet by mouth once a week. 01/16/16   [name], MD  clotrimazole-betamethasone (LOTRISONE) cream APPLY TO THE AFFECTED TWICE DAILY 02/09/16   [name], MD  diclofenac sodium (VOLTAREN) 1 % GEL Apply 2 g topically 3 (three) times daily as needed. For pain to affected area 05/01/15   [name], MD  gabapentin (NEURONTIN) 300 MG capsule 2 capsules in AM, 1 capsule at noon, 2 capsules in PM daily 02/09/16   [name], MD  glucose blood (ACCU-CHEK AVIVA) test strip Use as directed to check blood sugar 4 times daily.  DX E11.9 02/09/16   [name], MD  hyoscyamine (ANASPAZ) 0.125 MG TBDP disintergrating tablet Place 1 tablet (0.125 mg total) under the tongue every 4 (four) hours as needed for cramping. 01/16/16   [name], MD  insulin aspart (NOVOLOG) 100 UNIT/ML injection Inject 10-17 Units into the skin 3 (three) times daily with meals. Use 10-17 unit 3 times daily before meals 02/09/16   [name], MD  Insulin Glargine (TOUJEO SOLOSTAR) 300 UNIT/ML SOPN Inject 42 Units into the skin at bedtime. 09/16/15   [name], MD  isosorbide  mononitrate (IMDUR) 120 MG 24 hr tablet Take 1 tablet (120 mg total) by mouth daily. 12/19/15   [name], MD  levothyroxine (SYNTHROID, LEVOTHROID) 75 MCG tablet Take 1 tablet (75 mcg total) by mouth daily. 02/09/16   [name], MD  metFORMIN (GLUCOPHAGE) 1000 MG tablet Take 1 tablet (1,000 mg total) by mouth 2 (two) times daily with a meal. 06/12/15   [name], MD  metoprolol (LOPRESSOR) 25 MG tablet Take 0.5 tablets (12.5 mg total) by mouth 2 (two) times daily. 11/25/15   Noemi Chapel, MD  nitroGLYCERIN (NITROSTAT) 0.4 MG SL tablet Place 1 tablet (0.4 mg total) under the tongue every 5 (five) minutes as needed (up to 3 doses). 02/09/16   [name], MD  ondansetron (ZOFRAN-ODT) 4 MG disintegrating tablet Take 1 tablet (4 mg total) by mouth every 8 (eight) hours as needed for nausea or vomiting. 01/16/16   [name], MD  pantoprazole (PROTONIX) 40 MG tablet Take 40 mg by mouth daily.    Historical Provider, MD  pramipexole (MIRAPEX) 0.5 MG tablet Take 1-2 tablets (0.5-1 mg total) by mouth at bedtime. 12/19/15   [name], MD  sertraline (ZOLOFT) 100 MG tablet Take 1 tablet (100 mg total) by mouth 2 (two) times daily. 07/24/15   [name], MD  tiZANidine (ZANAFLEX) 4 MG tablet Take 1 tablet (4 mg total) by mouth every 8 (eight) hours as needed for muscle spasms. 11/19/15   Pieter Partridge, DO  topiramate (TOPAMAX) 50 MG tablet Take 1 tablet (50 mg total) by mouth at bedtime.  04/25/15   Pieter Partridge, DO   BP 110/63 mmHg  Pulse 54  Temp(Src) 97.3 F (36.3 C) (Oral)  Resp 16  Ht 5\' 9"  (1.753 m)  Wt 180 lb (81.647 kg)  BMI 26.57 kg/m2  SpO2 97% Physical Exam  Constitutional: He is oriented to person, place, and time. He appears well-developed and well-nourished.  HENT:  Head: Normocephalic and atraumatic.  Eyes: EOM are normal. Pupils are equal, round, and reactive to light.  Neck: Neck supple.  Cardiovascular: Normal rate, regular rhythm, normal heart sounds and intact distal pulses.   Pulmonary/Chest: Effort normal and breath sounds normal.  Abdominal: Soft. Bowel sounds are normal. He exhibits no distension. There is no tenderness.  Musculoskeletal: Normal range of motion. He exhibits no edema.  Neurological: He is alert and oriented to person, place, and time. He has normal strength. No cranial nerve deficit. He exhibits normal muscle tone. Coordination normal. GCS eye subscore is 4. GCS verbal subscore is 5. GCS motor subscore is 6.  Cognitive function is intact. Patient is alert and oriented 4. Follows all commands appropriately. Sensation normal to light touch. Motor strength is 5/5 4 extremities.  Skin: Skin is warm, dry and intact.  Psychiatric: He has a normal mood and affect.    ED Course  Procedures (including critical care time) Labs Review Labs Reviewed  COMPREHENSIVE METABOLIC PANEL - Abnormal; Notable for the following:    Sodium 134 (*)    CO2 17 (*)    Glucose, Bld 445 (*)    Creatinine, Ser 1.25 (*)    Calcium 8.6 (*)    Total Protein 5.7 (*)    GFR calc non Af Amer 58 (*)    All other components within normal limits  CBC WITH DIFFERENTIAL/PLATELET - Abnormal; Notable for the following:    RBC 3.85 (*)    Hemoglobin 12.2 (*)  HCT 34.0 (*)    Platelets 144 (*)    All other components within normal limits  CBG MONITORING, ED - Abnormal; Notable for the following:    Glucose-Capillary 422 (*)    All other components within  normal limits  TROPONIN I    Imaging Review No results found. I have personally reviewed and evaluated these images and lab results as part of my medical decision-making.   EKG Interpretation   Date/Time:  Tuesday March 09 2016 19:55:03 EDT Ventricular Rate:  56 PR Interval:  212 QRS Duration: 120 QT Interval:  484 QTC Calculation: 467 R Axis:   32 Text Interpretation:  Sinus rhythm Borderline prolonged PR interval IVCD,  consider atypical RBBB Inferior infarct, acute Baseline wander in lead(s)  III aVF Confirmed by Johnney Killian, MD, Jeannie Done [phone]) on 03/10/2016 12:15:51 AM      MDM   Final diagnoses:  Confusion  Stupor  Hyperglycemia   Patient has had 2 episodes of mental status change in the past 2 days. There does not appear to be associated focal neurologic dysfunction. This appears more global with confusion yesterday and predominantly stupor and somnolence today. The episodes are resolving with the patient returning to normal mental status. Currently his neurologic examination is intact with normal cognitive function and motor function. At this time, it is unclear if this is a primary neurologic etiology. Yesterday consideration was for focal-type seizures. Patient does not have ill appearance. He does not have pain associated. Patient will be admitted for ongoing cardiac monitoring as well as neurology consultation.    Charlesetta Shanks, MD 03/10/16 [phone]

## 2016-03-10 ENCOUNTER — Observation Stay (HOSPITAL_COMMUNITY): Payer: Commercial Managed Care - HMO

## 2016-03-10 ENCOUNTER — Encounter (HOSPITAL_COMMUNITY): Payer: Self-pay | Admitting: General Practice

## 2016-03-10 ENCOUNTER — Observation Stay (HOSPITAL_BASED_OUTPATIENT_CLINIC_OR_DEPARTMENT_OTHER): Payer: Commercial Managed Care - HMO

## 2016-03-10 DIAGNOSIS — R410 Disorientation, unspecified: Secondary | ICD-10-CM | POA: Diagnosis not present

## 2016-03-10 DIAGNOSIS — R55 Syncope and collapse: Secondary | ICD-10-CM | POA: Diagnosis not present

## 2016-03-10 DIAGNOSIS — R4182 Altered mental status, unspecified: Secondary | ICD-10-CM | POA: Diagnosis not present

## 2016-03-10 LAB — CBC WITH DIFFERENTIAL/PLATELET
BASOS ABS: 0 10*3/uL (ref 0.0–0.1)
Basophils Relative: 0 %
EOS ABS: 0.2 10*3/uL (ref 0.0–0.7)
EOS PCT: 3 %
HCT: 34.3 % — ABNORMAL LOW (ref 39.0–52.0)
Hemoglobin: 11.8 g/dL — ABNORMAL LOW (ref 13.0–17.0)
LYMPHS PCT: 33 %
Lymphs Abs: 2 10*3/uL (ref 0.7–4.0)
MCH: 30.1 pg (ref 26.0–34.0)
MCHC: 34.4 g/dL (ref 30.0–36.0)
MCV: 87.5 fL (ref 78.0–100.0)
MONO ABS: 0.5 10*3/uL (ref 0.1–1.0)
Monocytes Relative: 8 %
Neutro Abs: 3.4 10*3/uL (ref 1.7–7.7)
Neutrophils Relative %: 56 %
PLATELETS: 138 10*3/uL — AB (ref 150–400)
RBC: 3.92 MIL/uL — ABNORMAL LOW (ref 4.22–5.81)
RDW: 13.2 % (ref 11.5–15.5)
WBC: 6.1 10*3/uL (ref 4.0–10.5)

## 2016-03-10 LAB — ECHOCARDIOGRAM COMPLETE
Height: 69 in
Weight: 2846.4 oz

## 2016-03-10 LAB — COMPREHENSIVE METABOLIC PANEL
ALBUMIN: 3.4 g/dL — AB (ref 3.5–5.0)
ALK PHOS: 61 U/L (ref 38–126)
ALT: 26 U/L (ref 17–63)
AST: 20 U/L (ref 15–41)
Anion gap: 10 (ref 5–15)
BILIRUBIN TOTAL: 0.7 mg/dL (ref 0.3–1.2)
BUN: 12 mg/dL (ref 6–20)
CALCIUM: 8.7 mg/dL — AB (ref 8.9–10.3)
CO2: 24 mmol/L (ref 22–32)
CREATININE: 1.05 mg/dL (ref 0.61–1.24)
Chloride: 108 mmol/L (ref 101–111)
GFR calc Af Amer: >60 mL/min (ref >60)
GFR calc non Af Amer: >60 mL/min (ref >60)
GLUCOSE: 86 mg/dL (ref 65–99)
Potassium: 3.4 mmol/L — ABNORMAL LOW (ref 3.5–5.1)
Sodium: 142 mmol/L (ref 135–145)
TOTAL PROTEIN: 5.7 g/dL — AB (ref 6.5–8.1)

## 2016-03-10 LAB — GLUCOSE, CAPILLARY
Glucose-Capillary: 273 mg/dL — ABNORMAL HIGH (ref 65–99)
Glucose-Capillary: 143 mg/dL — ABNORMAL HIGH (ref 65–99)
Glucose-Capillary: 266 mg/dL — ABNORMAL HIGH (ref 65–99)

## 2016-03-10 LAB — MAGNESIUM: Magnesium: 1.8 mg/dL (ref 1.7–2.4)

## 2016-03-10 LAB — TSH: TSH: 5.287 u[IU]/mL — AB (ref 0.350–4.500)

## 2016-03-10 LAB — PHOSPHORUS: Phosphorus: 4 mg/dL (ref 2.5–4.6)

## 2016-03-10 LAB — CBG MONITORING, ED: GLUCOSE-CAPILLARY: 94 mg/dL (ref 65–99)

## 2016-03-10 MED ORDER — ASPIRIN EC 81 MG PO TBEC
81.0000 mg | DELAYED_RELEASE_TABLET | Freq: Every day | ORAL | Status: DC
  Administered 2016-03-10 – 2016-03-11 (×2): 81 mg via ORAL
  Filled 2016-03-10 (×2): qty 1

## 2016-03-10 MED ORDER — ONDANSETRON HCL 4 MG PO TABS: 4.0000 mg | ORAL_TABLET | Freq: Four times a day (QID) | ORAL | Status: DC | PRN

## 2016-03-10 MED ORDER — BACLOFEN 10 MG PO TABS
5.0000 mg | ORAL_TABLET | Freq: Every day | ORAL | Status: DC
  Administered 2016-03-10: 5 mg via ORAL
  Filled 2016-03-10: qty 1

## 2016-03-10 MED ORDER — METOPROLOL TARTRATE 12.5 MG HALF TABLET
12.5000 mg | ORAL_TABLET | Freq: Two times a day (BID) | ORAL | Status: DC
  Administered 2016-03-10: 12.5 mg via ORAL
  Filled 2016-03-10 (×2): qty 1

## 2016-03-10 MED ORDER — DICLOFENAC SODIUM 1 % TD GEL
2.0000 g | Freq: Three times a day (TID) | TRANSDERMAL | Status: DC
  Administered 2016-03-10: 2 g via TOPICAL
  Filled 2016-03-10: qty 100

## 2016-03-10 MED ORDER — SODIUM CHLORIDE 0.45 % IV SOLN
INTRAVENOUS | Status: AC
  Administered 2016-03-10: 05:00:00 via INTRAVENOUS

## 2016-03-10 MED ORDER — IOPAMIDOL (ISOVUE-370) INJECTION 76%
INTRAVENOUS | Status: AC
  Administered 2016-03-10: 50 mL
  Filled 2016-03-10: qty 50

## 2016-03-10 MED ORDER — INSULIN GLARGINE 100 UNIT/ML ~~LOC~~ SOLN
42.0000 [IU] | Freq: Every day | SUBCUTANEOUS | Status: DC
  Administered 2016-03-10: 42 [IU] via SUBCUTANEOUS
  Filled 2016-03-10 (×2): qty 0.42

## 2016-03-10 MED ORDER — POTASSIUM CHLORIDE CRYS ER 20 MEQ PO TBCR
40.0000 meq | EXTENDED_RELEASE_TABLET | Freq: Once | ORAL | Status: AC
  Administered 2016-03-10: 40 meq via ORAL
  Filled 2016-03-10: qty 2

## 2016-03-10 MED ORDER — GADOBENATE DIMEGLUMINE 529 MG/ML IV SOLN
17.0000 mL | Freq: Once | INTRAVENOUS | Status: AC
  Administered 2016-03-10: 17 mL via INTRAVENOUS

## 2016-03-10 MED ORDER — TOPIRAMATE 25 MG PO TABS
50.0000 mg | ORAL_TABLET | Freq: Every day | ORAL | Status: DC
  Administered 2016-03-10: 50 mg via ORAL
  Filled 2016-03-10: qty 2

## 2016-03-10 MED ORDER — SODIUM CHLORIDE 0.9% FLUSH
3.0000 mL | Freq: Two times a day (BID) | INTRAVENOUS | Status: DC
  Administered 2016-03-10 – 2016-03-11 (×3): 3 mL via INTRAVENOUS

## 2016-03-10 MED ORDER — METFORMIN HCL 500 MG PO TABS
1000.0000 mg | ORAL_TABLET | Freq: Two times a day (BID) | ORAL | Status: DC
  Filled 2016-03-10: qty 2

## 2016-03-10 MED ORDER — ONDANSETRON HCL 4 MG/2ML IJ SOLN: 4.0000 mg | Freq: Four times a day (QID) | INTRAMUSCULAR | Status: DC | PRN

## 2016-03-10 MED ORDER — SERTRALINE HCL 100 MG PO TABS
100.0000 mg | ORAL_TABLET | Freq: Two times a day (BID) | ORAL | Status: DC
  Administered 2016-03-10 – 2016-03-11 (×3): 100 mg via ORAL
  Filled 2016-03-10: qty 1
  Filled 2016-03-10: qty 2
  Filled 2016-03-10: qty 1

## 2016-03-10 MED ORDER — ENOXAPARIN SODIUM 40 MG/0.4ML ~~LOC~~ SOLN
40.0000 mg | Freq: Every day | SUBCUTANEOUS | Status: DC
  Administered 2016-03-10 – 2016-03-11 (×2): 40 mg via SUBCUTANEOUS
  Filled 2016-03-10 (×3): qty 0.4

## 2016-03-10 MED ORDER — PRAMIPEXOLE DIHYDROCHLORIDE 0.25 MG PO TABS
0.5000 mg | ORAL_TABLET | Freq: Every day | ORAL | Status: DC
  Administered 2016-03-10: 0.5 mg via ORAL
  Filled 2016-03-10: qty 4

## 2016-03-10 MED ORDER — LEVOTHYROXINE SODIUM 75 MCG PO TABS
75.0000 ug | ORAL_TABLET | Freq: Every day | ORAL | Status: DC
  Administered 2016-03-10 – 2016-03-11 (×2): 75 ug via ORAL
  Filled 2016-03-10 (×3): qty 1

## 2016-03-10 MED ORDER — ISOSORBIDE MONONITRATE ER 60 MG PO TB24
120.0000 mg | ORAL_TABLET | Freq: Every day | ORAL | Status: DC
  Administered 2016-03-10 – 2016-03-11 (×2): 120 mg via ORAL
  Filled 2016-03-10 (×2): qty 2

## 2016-03-10 MED ORDER — VITAMIN D (ERGOCALCIFEROL) 1.25 MG (50000 UNIT) PO CAPS: 50000.0000 [IU] | ORAL_CAPSULE | ORAL | Status: DC

## 2016-03-10 MED ORDER — KETOROLAC TROMETHAMINE 15 MG/ML IJ SOLN
15.0000 mg | Freq: Once | INTRAMUSCULAR | Status: AC
  Administered 2016-03-10: 15 mg via INTRAVENOUS
  Filled 2016-03-10: qty 1

## 2016-03-10 MED ORDER — ATORVASTATIN CALCIUM 40 MG PO TABS
40.0000 mg | ORAL_TABLET | Freq: Every day | ORAL | Status: DC
  Administered 2016-03-10: 40 mg via ORAL
  Filled 2016-03-10: qty 1

## 2016-03-10 MED ORDER — ACETAMINOPHEN 325 MG PO TABS
650.0000 mg | ORAL_TABLET | Freq: Four times a day (QID) | ORAL | Status: DC | PRN
  Filled 2016-03-10: qty 2

## 2016-03-10 MED ORDER — INSULIN ASPART 100 UNIT/ML ~~LOC~~ SOLN
0.0000 [IU] | Freq: Three times a day (TID) | SUBCUTANEOUS | Status: DC
  Administered 2016-03-10: 2 [IU] via SUBCUTANEOUS
  Administered 2016-03-10: 8 [IU] via SUBCUTANEOUS
  Administered 2016-03-11: 5 [IU] via SUBCUTANEOUS

## 2016-03-10 MED ORDER — HYOSCYAMINE SULFATE 0.125 MG PO TBDP
0.1250 mg | ORAL_TABLET | ORAL | Status: DC | PRN
  Filled 2016-03-10: qty 1

## 2016-03-10 MED ORDER — PANTOPRAZOLE SODIUM 40 MG PO TBEC
40.0000 mg | DELAYED_RELEASE_TABLET | Freq: Every day | ORAL | Status: DC
  Administered 2016-03-10 – 2016-03-11 (×2): 40 mg via ORAL
  Filled 2016-03-10 (×2): qty 1

## 2016-03-10 MED ORDER — TIZANIDINE HCL 4 MG PO TABS
4.0000 mg | ORAL_TABLET | Freq: Three times a day (TID) | ORAL | Status: DC | PRN
  Filled 2016-03-10: qty 1

## 2016-03-10 MED ORDER — NITROGLYCERIN 0.4 MG SL SUBL
0.4000 mg | SUBLINGUAL_TABLET | SUBLINGUAL | Status: DC | PRN
  Filled 2016-03-10: qty 1

## 2016-03-10 NOTE — Procedures (Signed)
ELECTROENCEPHALOGRAM REPORT  Date of Study: 03/10/2016  Patient's Name: [name] [name] MRN: [id number] Date of Birth: 05/18/1946  Referring Provider: Tennis Must, MD  Indication: 70 y.o. Male with history of prior concussions and headache is presenting after experiencing episodes of blackouts and episodes which he describes as presyncope in which he feels like he is going to faint but doesn't, he gets unsteady on his feet and developes slurry speech. At times he gets confused after these episodes and today it took him 72min to get back to his baseline.   Medications: acetaminophen (TYLENOL) tablet 650 mg aspirin EC tablet 81 mg atorvastatin (LIPITOR) tablet 40 mg diclofenac sodium (VOLTAREN) 1 % transdermal gel 2 g hyoscyamine (ANASPAZ) disintergrating tablet 0.125 mg insulin aspart (novoLOG) injection 0-15 Units insulin glargine (LANTUS) injection 42 Units isosorbide mononitrate (IMDUR) 24 hr tablet 120 mg levothyroxine (SYNTHROID, LEVOTHROID) tablet 75 mcg metFORMIN (GLUCOPHAGE) tablet 1,000 mg metoprolol tartrate (LOPRESSOR) tablet 12.5 mg nitroGLYCERIN (NITROSTAT) SL tablet 0.4 mg ondansetron (ZOFRAN) injection 4 mg pantoprazole (PROTONIX) EC tablet 40 mg pramipexole (MIRAPEX) tablet 0.5-1 mg sertraline (ZOLOFT) tablet 100 mg tiZANidine (ZANAFLEX) tablet 4 mg topiramate (TOPAMAX) tablet 50 mg  Technical Summary: This is a multichannel digital EEG recording, using the international 10-20 placement system with electrodes applied with paste and impedances below 5000 ohms.    Description: The EEG background is symmetric, with a well-developed posterior dominant rhythm of 8 Hz, which is reactive to eye opening and closing.  Diffuse beta activity is seen, with a bilateral frontal preponderance.  No focal or generalized abnormalities are seen.  No focal or generalized epileptiform discharges are seen.  Stage II sleep is seen, with normal and symmetric sleep  patterns.  Photic stimulation was performed, and produced no abnormalities.  ECG revealed normal cardiac rate and rhythm.  Impression: This is a normal routine EEG of the awake and asleep states, with activating procedures.  A normal study does not rule out the possibility of a seizure disorder in this patient.  Tavionna Grout R. [name], DO

## 2016-03-10 NOTE — Progress Notes (Signed)
Held 10 am 12.5 metoprolol. HR 52

## 2016-03-10 NOTE — ED Notes (Signed)
Patient transported to CT 

## 2016-03-10 NOTE — Plan of Care (Signed)
Problem: Safety: Goal: Ability to remain free from injury will improve Outcome: Progressing Pt is alert and oriented x4. Pt states no pain and is resting comfortably. Pt EEG (-) CT (-). ECHO EF was  55-65%. Pt has no complaints at this time.

## 2016-03-10 NOTE — Progress Notes (Signed)
Echocardiogram 2D Echocardiogram has been performed.  Tresa Res 03/10/2016, 1:53 PM

## 2016-03-10 NOTE — H&P (Signed)
[facility] History and Physical  [name] [name] [id number] DOB: 11/27/1946 DOA: 03/09/2016  Referring physician: Tomasa [name], [name] PCP: [name] Homans, MD   Chief Complaint: Altered mental status.  HPI: [name] [name] is a 70 y.o. male with a past medical history of type 2 diabetes, obstructive sleep apnea, hyperlipidemia, depression, hypertension, CAD/CABG, nephrolithiasis, restless leg syndrome comes to the emergency department via EMS for the second day in a row due to altered mental status and slurred speech.  Per patient's wife, he was seen last night by Dr. Vallery [name] at [facility] and today for similar symptoms. Patient has been having periods of somnolence, followed by slurred speech and lethargy. Patient also has been having LUE tremors for about a year. Recent MRI imaging of the brain did not show any acute abnormalities.   Review of Systems:  History was provided by the patient's wife.  Past Medical History  Diagnosis Date  . Diabetes mellitus type II, uncontrolled ([place])   . OSA (obstructive sleep apnea)   . Hyperlipidemia   . RBBB (right bundle branch block)   . Depression     Bipolar  . Edema   . Hypertension   . Falling episodes     these have occurred in the past and again recurring 2011  . Spondylosis     C5-6, C6-7 MRI 2010  . CAD (coronary artery disease)     A. CABG in 2000,status post cardiac cath in 2006, 2009 ....continued chest pain and SOB despite oral medication adjestments including Ranexa. B. Cath November 2009/ mRCA - 2.75 x 23 Abbott Xience V drug-eluting stent ...11/26/2008 to distal  RCA leading to acute marginal.  C. Cath 07/2012 for CP - stable anatomy, med rx. d. cath 2015 and 05/30/2015 stable anatomy, consider Myoview if has CP again  . Hx of CABG     2000,  / one median sternotomy suture broken her chest x-ray November, 2010, no clinical significance  . Palpitations     event recorder showed sinus rhythm  . Nephrolithiasis   .  Restless leg   . Dizziness   . Shortness of breath     CPX April, 2011, mild functional limitation, no clear pulmonary or cardiac limitation, possible deconditioning and mild chronotropic incompetence( peak heart rate 130)  . Cerebral ischemia     MRI November, 2010, chronic microvascular ischemia  . Anemia     hemoglobin 7.4, iron deficiency, January, 2011, 2 unit transfusion, endoscopy normal, capsule endoscopy February, 2011 no small bowel abnormalities.   Most likely source gastric erosions, followed by GI  . H/O medication noncompliance     Due to loss of insurance  . Ejection fraction     EF 60%, echo, July 31, 2012  . Diabetes mellitus without complication ([place])   . Pain in limb 06/12/2009    Qualifier: Diagnosis of  By: [name] [name]   . Low back pain 06/12/2009    Qualifier: Diagnosis of  By: [name] [name]   . Thyroid disease   . Right knee pain 01/07/2015  . Advance care planning 05/01/2015  . ACP (advance care planning) 05/11/2015  . Nausea with vomiting 01/25/2016  . Headache 01/25/2016  . Diarrhea 02/15/2016  . Gastric ulcer   . Tubulovillous adenoma of colon 2007  . Iron deficiency anemia    Past Surgical History  Procedure Laterality Date  . Nasal septum surgery      UP3  . Coronary artery bypass graft  2000  . Left heart catheterization with coronary/graft angiogram N/A 08/01/2012    Procedure: LEFT HEART CATHETERIZATION WITH [name] [name];  Surgeon: [name] Bow, MD;  Location: [facility] CATH LAB;  Service: Cardiovascular;  Laterality: N/A;  . Left heart catheterization with coronary/graft angiogram N/A 01/03/2015    Procedure: LEFT HEART CATHETERIZATION WITH [name] [name];  Surgeon: [name] Harp, MD;  Location: [facility] CATH LAB;  Service: Cardiovascular;  Laterality: N/A;  . Percutaneous coronary stent intervention (pci-s)  10/2008    mRCA PCI  2.75 x 23 Abbott Xience V drug-eluting stent   . Cardiac catheterization N/A 05/30/2015     Procedure: Left Heart Cath and Coronary Angiography;  Surgeon: [name] Man, MD;  Location: [place] CV LAB;  Service: Cardiovascular;  Laterality: N/A;   Social History:  reports that he has never smoked. He has never used smokeless tobacco. He reports that he does not drink alcohol or use illicit drugs.  Allergies  Allergen Reactions  . Morphine Other (See Comments)    hallucinations  . Shellfish-Derived Products Itching    Family History  Problem Relation Age of Onset  . Cancer Brother     pancreatic cancer  . Diabetes Brother     4   . Stroke Brother   . Diabetes Brother   . Cancer Brother   . Diabetes Brother   . Coronary artery disease Brother   . Diabetes Brother   . Hypothyroidism Brother   . Heart attack      Nephew  . Diabetes Mother   . Heart failure Mother   . Heart failure Father   . Irregular heart beat Daughter   . Cancer Maternal Grandmother     unknown     Prior to Admission medications   Medication Sig Start Date End Date Taking? Authorizing Provider  aspirin EC 81 MG tablet Take 1 tablet (81 mg total) by mouth daily. 02/16/16  Yes [name] Booze, MD  atorvastatin (LIPITOR) 40 MG tablet Take 1 tablet (40 mg total) by mouth daily at 6 PM. 05/30/15  Yes [name] Deforest, PA  Cholecalciferol (DIALYVITE VITAMIN D3 MAX) 16109 units TABS Take 1 tablet by mouth once a week. 01/16/16  Yes [name] Lukes, MD  clotrimazole-betamethasone (LOTRISONE) cream APPLY TO THE AFFECTED TWICE DAILY Patient taking differently: Apply 1 application topically 2 (two) times daily. APPLY TO THE AFFECTED TWICE DAILY 02/09/16  Yes [name] Lukes, MD  diclofenac sodium (VOLTAREN) 1 % GEL Apply 2 g topically 3 (three) times daily as needed. For pain to affected area 05/01/15  Yes [name] Lukes, MD  gabapentin (NEURONTIN) 300 MG capsule 2 capsules in AM, 1 capsule at noon, 2 capsules in PM daily Patient taking differently: Take 300 mg by mouth See admin instructions. 2 capsules in AM, 1  capsule at noon, 2 capsules in PM daily 02/09/16  Yes [name] Lukes, MD  hyoscyamine (ANASPAZ) 0.125 MG TBDP disintergrating tablet Place 1 tablet (0.125 mg total) under the tongue every 4 (four) hours as needed for cramping. 01/16/16  Yes [name] Lukes, MD  insulin aspart (NOVOLOG) 100 UNIT/ML injection Inject 10-17 Units into the skin 3 (three) times daily with meals. Use 10-17 unit 3 times daily before meals Patient taking differently: Inject 10-17 Units into the skin 3 (three) times daily with meals. Use 10-17 unit 3 times daily before meals per sliding scale 02/09/16  Yes [name] Lukes, MD  Insulin Glargine (TOUJEO SOLOSTAR) 300 UNIT/ML SOPN Inject 42  Units into the skin at bedtime. 09/16/15  Yes [name] Lukes, MD  isosorbide mononitrate (IMDUR) 120 MG 24 hr tablet Take 1 tablet (120 mg total) by mouth daily. 12/19/15  Yes [name] Lukes, MD  levothyroxine (SYNTHROID, LEVOTHROID) 75 MCG tablet Take 1 tablet (75 mcg total) by mouth daily. 02/09/16  Yes [name] Lukes, MD  metFORMIN (GLUCOPHAGE) 1000 MG tablet Take 1 tablet (1,000 mg total) by mouth 2 (two) times daily with a meal. 06/12/15  Yes [name] Lukes, MD  metoprolol (LOPRESSOR) 25 MG tablet Take 0.5 tablets (12.5 mg total) by mouth 2 (two) times daily. 11/25/15  Yes Noemi Chapel, MD  ondansetron (ZOFRAN-ODT) 4 MG disintegrating tablet Take 1 tablet (4 mg total) by mouth every 8 (eight) hours as needed for nausea or vomiting. 01/16/16  Yes [name] Lukes, MD  pantoprazole (PROTONIX) 40 MG tablet Take 40 mg by mouth daily.   Yes Historical Provider, MD  pramipexole (MIRAPEX) 0.5 MG tablet Take 1-2 tablets (0.5-1 mg total) by mouth at bedtime. 12/19/15  Yes [name] Lukes, MD  sertraline (ZOLOFT) 100 MG tablet Take 1 tablet (100 mg total) by mouth 2 (two) times daily. 07/24/15  Yes [name] Lukes, MD  tiZANidine (ZANAFLEX) 4 MG tablet Take 1 tablet (4 mg total) by mouth every 8 (eight) hours as needed for muscle spasms. 11/19/15  Yes Adam Telford Nab, DO   topiramate (TOPAMAX) 50 MG tablet Take 1 tablet (50 mg total) by mouth at bedtime. 04/25/15  Yes Adam Telford Nab, DO  glucose blood (ACCU-CHEK AVIVA) test strip Use as directed to check blood sugar 4 times daily.  DX E11.9 02/09/16   [name] Lukes, MD  nitroGLYCERIN (NITROSTAT) 0.4 MG SL tablet Place 1 tablet (0.4 mg total) under the tongue every 5 (five) minutes as needed (up to 3 doses). Patient taking differently: Place 0.4 mg under the tongue every 5 (five) minutes as needed for chest pain (up to 3 doses).  02/09/16   [name] Lukes, MD   Physical Exam: Filed Vitals:   03/09/16 2015 03/09/16 2030 03/09/16 2100 03/09/16 2328  BP: 88/47 109/56 104/54 110/63  Pulse: 56 54 60 54  Temp:    97.3 F (36.3 C)  TempSrc:    Oral  Resp: 20 20 13 16   Height:      Weight:      SpO2: 96% 97% 97% 97%    Wt Readings from Last 3 Encounters:  03/09/16 81.647 kg (180 lb)  03/09/16 81.761 kg (180 lb 4 oz)  03/08/16 78.926 kg (174 lb)    General:  Appears calm and comfortable Eyes: PERRL, normal lids, irises & conjunctiva ENT: grossly normal hearing, lips & tongue Neck: no LAD, masses or thyromegaly Cardiovascular: RRR, no m/r/g. No LE edema. Telemetry: SR, no arrhythmias  Respiratory: CTA bilaterally, no w/r/r. Normal respiratory effort. Abdomen: soft, ntnd Skin: no rash or induration seen on limited exam Musculoskeletal: grossly normal tone BUE/BLE Psychiatric: grossly normal mood and affect, speech fluent and appropriate Neurologic: Awake, alert, oriented 4, positive tremor and pronator drift on left UE, otherwise the exam is negative.           Labs on Admission:  Basic Metabolic Panel:  Recent Labs Lab 03/08/16 1728 03/09/16 2036  NA 134* 134*  K 4.7 4.4  CL 100* 105  CO2 26 17*  GLUCOSE 450* 445*  BUN 15 15  CREATININE 1.18 1.25*  CALCIUM 9.1 8.6*   Liver Function Tests:  Recent Labs Lab 03/08/16 1728 03/09/16 2036  AST 26 23  ALT 34 29  ALKPHOS 82 89  BILITOT 0.7  0.8  PROT 7.2 5.7*  ALBUMIN 4.3 3.5   CBC:  Recent Labs Lab 03/08/16 1728 03/09/16 2036  WBC 6.6 8.0  NEUTROABS 4.0 5.2  HGB 13.5 12.2*  HCT 38.3* 34.0*  MCV 89.5 88.3  PLT 148* 144*   Cardiac Enzymes:  Recent Labs Lab 03/08/16 1728 03/09/16 2036  TROPONINI <0.03 <0.03    BNP (last 3 results)  Recent Labs  05/29/15 1121  BNP 22.6    CBG:  Recent Labs Lab 03/08/16 1555 03/09/16 2000  GLUCAP 521* 422*    EKG: Independently reviewed. Vent. rate 56 BPM PR interval 212 ms QRS duration 120 ms QT/QTc 484/467 ms P-R-T axes 46 32 59 Sinus rhythm Borderline prolonged PR interval IVCD, consider atypical RBBB Inferior infarct, acute Baseline wander in lead(s) III aVF  Assessment/Plan Principal Problem:   Pre-syncope   Confusion   History of Carotid artery disease (HCC) Admit to telemetry/observation for further evaluation. Frequent neuro checks. Check carotid Doppler. Check echocardiogram. Check EEG. MRI brain, CTA head and neck.  Active Problems:   Depression Continue sertraline.    GERD Continue pantoprazole 40 mg by mouth daily.    Anemia Monitor hematocrit and hemoglobin.    Essential hypertension Continue metoprolol. Blood pressure monitoring.     Diabetes mellitus, type 2 (HCC) Carbohydrate modified diet. Continue long-acting insulin. CBG monitoring with regular insulin sliding scale.        Neurology is consulting.   Code Status: Full code. DVT Prophylaxis:Lovenox SQ. Family Communication: His wife was present in the room. Disposition Plan: Admit for further workup and neurology evaluation.  Time spent: Over 70 minutes was spent in the admission.  Gerri Lins Kona Lover,[name] [facility] Pager [phone].

## 2016-03-10 NOTE — Progress Notes (Signed)
Interval History:                                                                                                                      [name] [name] is an 70 y.o. male patient with altered mental status. Neurology service consulted for further evaluation. Neurodiagnostic evaluation overnight showed negative EEG, negative brain MRI and CT [name] [name] head and neck studies. Patient's mental status is at baseline now. He reports having some neck pain and neck spasms which have been chronic. No new neurological symptoms otherwise.     Past Medical History: Past Medical History  Diagnosis Date  . Diabetes mellitus type II, uncontrolled ([place])   . OSA (obstructive sleep apnea)   . Hyperlipidemia   . RBBB (right bundle branch block)   . Depression     Bipolar  . Edema   . Hypertension   . Falling episodes     these have occurred in the past and again recurring 2011  . Spondylosis     C5-6, C6-7 MRI 2010  . CAD (coronary artery disease)     A. CABG in 2000,status post cardiac cath in 2006, 2009 ....continued chest pain and SOB despite oral medication adjestments including Ranexa. B. Cath November 2009/ mRCA - 2.75 x 23 Abbott Xience V drug-eluting stent ...11/26/2008 to distal  RCA leading to acute marginal.  C. Cath 07/2012 for CP - stable anatomy, med rx. d. cath 2015 and 05/30/2015 stable anatomy, consider Myoview if has CP again  . Hx of CABG     2000,  / one median sternotomy suture broken her chest x-ray November, 2010, no clinical significance  . Palpitations     event recorder showed sinus rhythm  . Nephrolithiasis   . Restless leg   . Dizziness   . Shortness of breath     CPX April, 2011, mild functional limitation, no clear pulmonary or cardiac limitation, possible deconditioning and mild chronotropic incompetence( peak heart rate 130)  . Cerebral ischemia     MRI November, 2010, chronic microvascular ischemia  . Anemia     hemoglobin 7.4, iron deficiency, January, 2011, 2 unit  transfusion, endoscopy normal, capsule endoscopy February, 2011 no small bowel abnormalities.   Most likely source gastric erosions, followed by GI  . H/O medication noncompliance     Due to loss of insurance  . Ejection fraction     EF 60%, echo, July 31, 2012  . Diabetes mellitus without complication ([place])   . Pain in limb 06/12/2009    Qualifier: Diagnosis of  By: [name]   . Low back pain 06/12/2009    Qualifier: Diagnosis of  By: [name]   . Thyroid disease   . Right knee pain 01/07/2015  . Advance care planning 05/01/2015  . ACP (advance care planning) 05/11/2015  . Nausea with vomiting 01/25/2016  . Headache 01/25/2016  . Diarrhea 02/15/2016  . Gastric ulcer   . Tubulovillous adenoma of colon 2007  .  Iron deficiency anemia   . Syncope 03/2016    Past Surgical History  Procedure Laterality Date  . Nasal septum surgery      UP3  . Coronary artery bypass graft      2000  . Left heart catheterization with coronary/graft angiogram N/A 08/01/2012    Procedure: LEFT HEART CATHETERIZATION WITH Beatrix Fetters;  Surgeon: [name], MD;  Location: [facility] CATH LAB;  Service: Cardiovascular;  Laterality: N/A;  . Left heart catheterization with coronary/graft angiogram N/A 01/03/2015    Procedure: LEFT HEART CATHETERIZATION WITH Beatrix Fetters;  Surgeon: [name], MD;  Location: [facility] CATH LAB;  Service: Cardiovascular;  Laterality: N/A;  . Percutaneous coronary stent intervention (pci-s)  10/2008    mRCA PCI  2.75 x 23 Abbott Xience V drug-eluting stent   . Cardiac catheterization N/A 05/30/2015    Procedure: Left Heart Cath and Coronary Angiography;  Surgeon: [name], MD;  Location: [place] CV LAB;  Service: Cardiovascular;  Laterality: N/A;    Family History: Family History  Problem Relation Age of Onset  . Cancer Brother     pancreatic cancer  . Diabetes Brother     4   . Stroke Brother   . Diabetes Brother   . Cancer Brother    . Diabetes Brother   . Coronary artery disease Brother   . Diabetes Brother   . Hypothyroidism Brother   . Heart attack      Nephew  . Diabetes Mother   . Heart failure Mother   . Heart failure Father   . Irregular heart beat Daughter   . Cancer Maternal Grandmother     unknown     Social History:   reports that he has never smoked. He has never used smokeless tobacco. He reports that he does not drink alcohol or use illicit drugs.  Allergies:  Allergies  Allergen Reactions  . Morphine Other (See Comments)    hallucinations  . Shellfish-Derived Products Itching     Medications:                                                                                                                         Current facility-administered medications:  .  acetaminophen (TYLENOL) tablet 650 mg, 650 mg, Oral, Q6H PRN, Reubin Milan, MD .  aspirin EC tablet 81 mg, 81 mg, Oral, Daily, Reubin Milan, MD, 81 mg at 03/10/16 1044 .  atorvastatin (LIPITOR) tablet 40 mg, 40 mg, Oral, q1800, Reubin Milan, MD .  diclofenac sodium (VOLTAREN) 1 % transdermal gel 2 g, 2 g, Topical, TID AC & HS, Reubin Milan, MD, Stopped at 03/10/16 [phone] .  enoxaparin (LOVENOX) injection 40 mg, 40 mg, Subcutaneous, Daily, Reubin Milan, MD, 40 mg at 03/10/16 1044 .  hyoscyamine (ANASPAZ) disintergrating tablet 0.125 mg, 0.125 mg, Sublingual, Q4H PRN, Reubin Milan, MD .  insulin aspart (novoLOG) injection 0-15 Units, 0-15 Units, Subcutaneous, TID WC, Gerri Lins  Olevia Bowens, MD, 8 Units at 03/10/16 1156 .  insulin glargine (LANTUS) injection 42 Units, 42 Units, Subcutaneous, QHS, Reubin Milan, MD .  isosorbide mononitrate (IMDUR) 24 hr tablet 120 mg, 120 mg, Oral, Daily, Reubin Milan, MD, 120 mg at 03/10/16 1038 .  levothyroxine (SYNTHROID, LEVOTHROID) tablet 75 mcg, 75 mcg, Oral, QAC breakfast, Reubin Milan, MD, 75 mcg at 03/10/16 1044 .  metoprolol tartrate (LOPRESSOR) tablet  12.5 mg, 12.5 mg, Oral, BID, Reubin Milan, MD, Stopped at 03/10/16 1000 .  nitroGLYCERIN (NITROSTAT) SL tablet 0.4 mg, 0.4 mg, Sublingual, Q5 min PRN, Reubin Milan, MD .  ondansetron [facility]) tablet 4 mg, 4 mg, Oral, Q6H PRN **OR** ondansetron (ZOFRAN) injection 4 mg, 4 mg, Intravenous, Q6H PRN, Reubin Milan, MD .  pantoprazole (PROTONIX) EC tablet 40 mg, 40 mg, Oral, Daily, Reubin Milan, MD, 40 mg at 03/10/16 1037 .  pramipexole (MIRAPEX) tablet 0.5-1 mg, 0.5-1 mg, Oral, QHS, Reubin Milan, MD .  sertraline (ZOLOFT) tablet 100 mg, 100 mg, Oral, BID, Reubin Milan, MD, 100 mg at 03/10/16 1037 .  sodium chloride flush (NS) 0.9 % injection 3 mL, 3 mL, Intravenous, Q12H, Reubin Milan, MD, 3 mL at 03/10/16 1000 .  tiZANidine (ZANAFLEX) tablet 4 mg, 4 mg, Oral, Q8H PRN, Reubin Milan, MD .  topiramate (TOPAMAX) tablet 50 mg, 50 mg, Oral, QHS, Reubin Milan, MD .  Derrill Memo ON 03/12/2016] Vitamin D (Ergocalciferol) (DRISDOL) capsule 50,000 Units, 50,000 Units, Oral, Q Fri, Reubin Milan, MD   Neurologic Examination:                                                                                                     Today's Vitals   03/10/16 1000 03/10/16 1200 03/10/16 1407 03/10/16 1500  BP: 150/72  128/60   Pulse: 56  61   Temp: 98.3 F (36.8 C)  98 F (36.7 C)   TempSrc: Oral  Oral   Resp: 15  16   Height: 5\' 9"  (1.753 m)     Weight: 80.695 kg (177 lb 14.4 oz)     SpO2: 98%  96%   PainSc: 0-No pain 0-No pain  0-No pain    Evaluation of higher integrative functions including: Level of alertness: Alert,  Oriented to time, place and person Speech: fluent, no evidence of dysarthria or aphasia noted.  Test the following cranial nerves: 2-12 grossly intact Motor examination: Normal tone, bulk, full 5/5 motor strength in all 4 extremities    Lab Results: Basic Metabolic Panel:  Recent Labs Lab 03/08/16 1728 03/09/16 2036 03/10/16 0550   NA 134* 134* 142  K 4.7 4.4 3.4*  CL 100* 105 108  CO2 26 17* 24  GLUCOSE 450* 445* 86  BUN 15 15 12   CREATININE 1.18 1.25* 1.05  CALCIUM 9.1 8.6* 8.7*  MG  --   --  1.8  PHOS  --   --  4.0    Liver Function Tests:  Recent Labs Lab 03/08/16 1728 03/09/16 2036 03/10/16 0550  AST 26  23 20  ALT 34 29 26  ALKPHOS 82 89 61  BILITOT 0.7 0.8 0.7  PROT 7.2 5.7* 5.7*  ALBUMIN 4.3 3.5 3.4*   No results for input(s): LIPASE, AMYLASE in the last 168 hours. No results for input(s): AMMONIA in the last 168 hours.  CBC:  Recent Labs Lab 03/08/16 1728 03/09/16 2036 03/10/16 0550  WBC 6.6 8.0 6.1  NEUTROABS 4.0 5.2 3.4  HGB 13.5 12.2* 11.8*  HCT 38.3* 34.0* 34.3*  MCV 89.5 88.3 87.5  PLT 148* 144* 138*    Cardiac Enzymes:  Recent Labs Lab 03/08/16 1728 03/09/16 2036  TROPONINI <0.03 <0.03    Lipid Panel: No results for input(s): CHOL, TRIG, HDL, CHOLHDL, VLDL, LDLCALC in the last 168 hours.  CBG:  Recent Labs Lab 03/08/16 1555 03/09/16 2000 03/10/16 0405 03/10/16 1150  GLUCAP 521* 422* 59 273*    Microbiology: Results for orders placed or performed in visit on 01/15/16  Urine culture     Status: None   Collection Time: 01/15/16  3:10 PM  Result Value Ref Range Status   Colony Count 3,000 COLONIES/ML  Final   Organism ID, Bacteria Insignificant Growth  Final    Imaging: Ct Angio Head W/cm &/or Wo Cm  03/10/2016  CLINICAL DATA:  Presyncopal episode. Confusion versus seizure. The patient develops slurred speech during these episodes. EXAM: CT ANGIOGRAPHY HEAD AND NECK TECHNIQUE: Multidetector CT imaging of the head and neck was performed using the standard protocol during bolus administration of intravenous contrast. Multiplanar CT image reconstructions and MIPs were obtained to evaluate the vascular anatomy. Carotid stenosis measurements (when applicable) are obtained utilizing NASCET criteria, using the distal internal carotid diameter as the denominator.  CONTRAST:  50 mL Isovue 370 COMPARISON:  MRI brain 01/15/2016. FINDINGS: CT HEAD Brain: Mild periventricular and subcortical white matter changes are stable. No acute cortical infarct, hemorrhage, or mass lesion is present. The ventricles are of normal size. The basal ganglia are intact. The insular ribbon is normal bilaterally. Calvarium and skull base: Within normal limits. Paranasal sinuses: Clear Orbits: Unremarkable CTA NECK Aortic arch: A 3 vessel arch configuration is present. Mural thickening is present at the aortic arch without focal stenosis or aneurysm. Right carotid system: The right common carotid artery is within normal limits. There is some atherosclerotic irregularity at the bifurcation without a significant stenosis. The cervical right ICA is normal. Left carotid system: Wall thickening is evident along the anteromedial border of the distal left common carotid artery. Mild atherosclerotic changes are noted at the carotid bifurcation. The cervical left ICA is otherwise normal. Vertebral arteries:The vertebral arteries both originate from the subclavian arteries. Right vertebral artery is the dominant vessel. No focal stenosis or vascular injury is evident in either vertebral artery within the neck. Skeleton: Endplate degenerative changes and uncovertebral spurring are most evident at C3-4 and C6-7. There is a osseous foraminal narrowing at both of these levels. The bone windows are otherwise unremarkable. There is slight anterior translation of the right mandibular condyle compared to the left. No focal lytic or blastic lesions are present. Other neck: No focal mucosal or submucosal lesions are present. The vocal cords are midline and symmetric. The tongue base is unremarkable. No significant cervical adenopathy is present. CTA HEAD Anterior circulation: Mild atherosclerotic irregularity in calcifications are present within the cavernous internal carotid arteries bilaterally. The ICA termini are  intact. The right A1 segment is dominant. The A1 and M1 segments are intact. The MCA bifurcations are within normal  limits bilaterally. There is some attenuation of distal MCA and ACA branch vessels bilaterally. No focal branch vessel occlusion is evident. The anterior communicating artery is patent. Posterior circulation: Atherosclerotic calcifications are evident at the dural margin of both vertebral arteries. There is moderate stenosis on the left. PICA origins are visualized and normal. The vertebrobasilar junction is intact. The basilar artery is normal. The right vertebral artery originates from the basilar tip. The left vertebral artery is predominantly from the posterior communicating artery with contribution from a left P1 segment. There is some attenuation of distal PCA branch vessels. Venous sinuses: The dural sinuses are patent. The right transverse sinus is dominant. The straight sinus and deep cerebral veins are intact. The cortical veins are intact. Anatomic variants: Fetal type left posterior cerebral artery with contribution from the P1 segment. Delayed phase: The postcontrast images demonstrate no pathologic enhancement. IMPRESSION: 1. Atherosclerotic changes with predominantly noncalcified plaque at the aortic arch, the distal left common carotid artery, both carotid bifurcations, and the cavernous internal carotid arteries. There are no significant focal stenoses. 2. Mild distal small vessel intracranial atherosclerotic changes in both the anterior and posterior circulation. 3. No significant proximal stenosis, aneurysm, or branch vessel occlusion within the circle of Willis. 4. Endplate degenerative changes and uncovertebral spurring at C3-4 and C6-7. Electronically Signed   By: San Morelle [name]   On: 03/10/2016 08:00   Ct Angio Neck W/cm &/or Wo/cm  03/10/2016  CLINICAL DATA:  Presyncopal episode. Confusion versus seizure. The patient develops slurred speech during these episodes.  EXAM: CT ANGIOGRAPHY HEAD AND NECK TECHNIQUE: Multidetector CT imaging of the head and neck was performed using the standard protocol during bolus administration of intravenous contrast. Multiplanar CT image reconstructions and MIPs were obtained to evaluate the vascular anatomy. Carotid stenosis measurements (when applicable) are obtained utilizing NASCET criteria, using the distal internal carotid diameter as the denominator. CONTRAST:  50 mL Isovue 370 COMPARISON:  MRI brain 01/15/2016. FINDINGS: CT HEAD Brain: Mild periventricular and subcortical white matter changes are stable. No acute cortical infarct, hemorrhage, or mass lesion is present. The ventricles are of normal size. The basal ganglia are intact. The insular ribbon is normal bilaterally. Calvarium and skull base: Within normal limits. Paranasal sinuses: Clear Orbits: Unremarkable CTA NECK Aortic arch: A 3 vessel arch configuration is present. Mural thickening is present at the aortic arch without focal stenosis or aneurysm. Right carotid system: The right common carotid artery is within normal limits. There is some atherosclerotic irregularity at the bifurcation without a significant stenosis. The cervical right ICA is normal. Left carotid system: Wall thickening is evident along the anteromedial border of the distal left common carotid artery. Mild atherosclerotic changes are noted at the carotid bifurcation. The cervical left ICA is otherwise normal. Vertebral arteries:The vertebral arteries both originate from the subclavian arteries. Right vertebral artery is the dominant vessel. No focal stenosis or vascular injury is evident in either vertebral artery within the neck. Skeleton: Endplate degenerative changes and uncovertebral spurring are most evident at C3-4 and C6-7. There is a osseous foraminal narrowing at both of these levels. The bone windows are otherwise unremarkable. There is slight anterior translation of the right mandibular condyle  compared to the left. No focal lytic or blastic lesions are present. Other neck: No focal mucosal or submucosal lesions are present. The vocal cords are midline and symmetric. The tongue base is unremarkable. No significant cervical adenopathy is present. CTA HEAD Anterior circulation: Mild atherosclerotic irregularity in calcifications are  present within the cavernous internal carotid arteries bilaterally. The ICA termini are intact. The right A1 segment is dominant. The A1 and M1 segments are intact. The MCA bifurcations are within normal limits bilaterally. There is some attenuation of distal MCA and ACA branch vessels bilaterally. No focal branch vessel occlusion is evident. The anterior communicating artery is patent. Posterior circulation: Atherosclerotic calcifications are evident at the dural margin of both vertebral arteries. There is moderate stenosis on the left. PICA origins are visualized and normal. The vertebrobasilar junction is intact. The basilar artery is normal. The right vertebral artery originates from the basilar tip. The left vertebral artery is predominantly from the posterior communicating artery with contribution from a left P1 segment. There is some attenuation of distal PCA branch vessels. Venous sinuses: The dural sinuses are patent. The right transverse sinus is dominant. The straight sinus and deep cerebral veins are intact. The cortical veins are intact. Anatomic variants: Fetal type left posterior cerebral artery with contribution from the P1 segment. Delayed phase: The postcontrast images demonstrate no pathologic enhancement. IMPRESSION: 1. Atherosclerotic changes with predominantly noncalcified plaque at the aortic arch, the distal left common carotid artery, both carotid bifurcations, and the cavernous internal carotid arteries. There are no significant focal stenoses. 2. Mild distal small vessel intracranial atherosclerotic changes in both the anterior and posterior  circulation. 3. No significant proximal stenosis, aneurysm, or branch vessel occlusion within the circle of Willis. 4. Endplate degenerative changes and uncovertebral spurring at C3-4 and C6-7. Electronically Signed   By: San Morelle [name]   On: 03/10/2016 08:00   Mr Jeri Cos [id number] Contrast  03/10/2016  CLINICAL DATA:  Presyncope EXAM: MRI HEAD WITHOUT AND WITH CONTRAST TECHNIQUE: Multiplanar, multiecho pulse sequences of the brain and surrounding structures were obtained without and with intravenous contrast. CONTRAST:  77mL MULTIHANCE GADOBENATE DIMEGLUMINE 529 MG/ML IV SOLN COMPARISON:  01/15/2016 FINDINGS: Calvarium and upper cervical spine: No focal marrow signal abnormality. Orbits: Negative. Sinuses and Mastoids: Clear. Brain: No acute abnormality such as acute infarct, hemorrhage, hydrocephalus, or mass lesion. No evidence of large vessel occlusion. Patchy FLAIR hyperintensity in the cerebral white matter compatible with chronic vessel disease, mild for age. IMPRESSION: 1. No acute finding or explanation for the history. 2. Mild chronic small vessel disease. Electronically Signed   By: Monte Fantasia [name]   On: 03/10/2016 08:49    Assessment and plan:   [name] [name] is an 70 y.o. male patient with negative altered mental status evaluation with a negative EEG, MRI brain and CT angiogram head and neck studies. No further neurodiagnostic workup recommended at this time. Clinically he is at his baseline now, no focal neurological deficits noted. He reports having chronic neck pain and neck spasms. He will benefit from starting muscle relaxant medication, baclofen 5 mg at bedtime. We'll sign off. please call for any further questions.

## 2016-03-10 NOTE — Plan of Care (Signed)
Problem: Safety: Goal: Ability to remain free from injury will improve Outcome: Completed/Met Date Met:  03/10/16 Pt compliant with using call light, verbalizes understanding for use of equipment in room including call light , calling nurse or aid on phone.

## 2016-03-10 NOTE — ED Notes (Signed)
Pt. In EEG at this time.

## 2016-03-10 NOTE — Progress Notes (Signed)
[facility] PROGRESS NOTE  [name] [name] [id number] DOB: 1946/09/15 DOA: 03/09/2016  PCP: Penni Homans, MD  Brief HPI: 70 year old Caucasian male with past medical history of type 2 diabetes, obstructive sleep apnea, hypertension, coronary artery disease, presented with 2 episodes of altered mental status, slurred speech. Denies any new medications. Denies changes to doses of any of his old medications. Denies any recent illness or sickness.  Past medical history:  Past Medical History  Diagnosis Date  . Diabetes mellitus type II, uncontrolled ([place])   . OSA (obstructive sleep apnea)   . Hyperlipidemia   . RBBB (right bundle branch block)   . Depression     Bipolar  . Edema   . Hypertension   . Falling episodes     these have occurred in the past and again recurring 2011  . Spondylosis     C5-6, C6-7 MRI 2010  . CAD (coronary artery disease)     A. CABG in 2000,status post cardiac cath in 2006, 2009 ....continued chest pain and SOB despite oral medication adjestments including Ranexa. B. Cath November 2009/ mRCA - 2.75 x 23 Abbott Xience V drug-eluting stent ...11/26/2008 to distal  RCA leading to acute marginal.  C. Cath 07/2012 for CP - stable anatomy, med rx. d. cath 2015 and 05/30/2015 stable anatomy, consider Myoview if has CP again  . Hx of CABG     2000,  / one median sternotomy suture broken her chest x-ray November, 2010, no clinical significance  . Palpitations     event recorder showed sinus rhythm  . Nephrolithiasis   . Restless leg   . Dizziness   . Shortness of breath     CPX April, 2011, mild functional limitation, no clear pulmonary or cardiac limitation, possible deconditioning and mild chronotropic incompetence( peak heart rate 130)  . Cerebral ischemia     MRI November, 2010, chronic microvascular ischemia  . Anemia     hemoglobin 7.4, iron deficiency, January, 2011, 2 unit transfusion, endoscopy normal, capsule endoscopy February, 2011 no small  bowel abnormalities.   Most likely source gastric erosions, followed by GI  . H/O medication noncompliance     Due to loss of insurance  . Ejection fraction     EF 60%, echo, July 31, 2012  . Diabetes mellitus without complication ([place])   . Pain in limb 06/12/2009    Qualifier: Diagnosis of  By: [name]   . Low back pain 06/12/2009    Qualifier: Diagnosis of  By: [name]   . Thyroid disease   . Right knee pain 01/07/2015  . Advance care planning 05/01/2015  . ACP (advance care planning) 05/11/2015  . Nausea with vomiting 01/25/2016  . Headache 01/25/2016  . Diarrhea 02/15/2016  . Gastric ulcer   . Tubulovillous adenoma of colon 2007  . Iron deficiency anemia   . Syncope 03/2016    Consultants: Neurology  Procedures:  EEG "Impression: This is a normal routine EEG of the awake and asleep states, with activating procedures. A normal study does not rule out the possibility of a seizure disorder in this patient."  Transthoracic Echocardiogram is pending   Antibiotics: None  Subjective: Patient feels well this morning. Feels almost back to baseline. Chest pain, shortness of breath, nausea. Denies any recent changes to his medications.  Objective: Vital Signs  Filed Vitals:   03/10/16 0600 03/10/16 0615 03/10/16 0950 03/10/16 1000  BP: 113/68 117/69 146/72 150/72  Pulse: 44 48  53 56  Temp:    98.3 F (36.8 C)  TempSrc:    Oral  Resp:  14 15 15   Height:    5\' 9"  (1.753 m)  Weight:    80.695 kg (177 lb 14.4 oz)  SpO2: 95% 97% 98% 98%    Intake/Output Summary (Last 24 hours) at 03/10/16 1333 Last data filed at 03/10/16 1300  Gross per 24 hour  Intake 2166.66 ml  Output      0 ml  Net 2166.66 ml   Filed Weights   03/09/16 1956 03/10/16 1000  Weight: 81.647 kg (180 lb) 80.695 kg (177 lb 14.4 oz)    General appearance: alert, cooperative, appears stated age and no distress Resp: clear to auscultation bilaterally Cardio: regular rate and rhythm, S1, S2  normal, no murmur, click, rub or gallop GI: soft, non-tender; bowel sounds normal; no masses,  no organomegaly Extremities: extremities normal, atraumatic, no cyanosis or edema Neurologic: Alert and oriented X 3, normal strength and tone bilateral upper and lower extremities.  Lab Results:  Basic Metabolic Panel:  Recent Labs Lab 03/08/16 1728 03/09/16 2036 03/10/16 0550  NA 134* 134* 142  K 4.7 4.4 3.4*  CL 100* 105 108  CO2 26 17* 24  GLUCOSE 450* 445* 86  BUN 15 15 12   CREATININE 1.18 1.25* 1.05  CALCIUM 9.1 8.6* 8.7*  MG  --   --  1.8  PHOS  --   --  4.0   Liver Function Tests:  Recent Labs Lab 03/08/16 1728 03/09/16 2036 03/10/16 0550  AST 26 23 20   ALT 34 29 26  ALKPHOS 82 89 61  BILITOT 0.7 0.8 0.7  PROT 7.2 5.7* 5.7*  ALBUMIN 4.3 3.5 3.4*   CBC:  Recent Labs Lab 03/08/16 1728 03/09/16 2036 03/10/16 0550  WBC 6.6 8.0 6.1  NEUTROABS 4.0 5.2 3.4  HGB 13.5 12.2* 11.8*  HCT 38.3* 34.0* 34.3*  MCV 89.5 88.3 87.5  PLT 148* 144* 138*   Cardiac Enzymes:  Recent Labs Lab 03/08/16 1728 03/09/16 2036  TROPONINI <0.03 <0.03   BNP (last 3 results)  Recent Labs  05/29/15 1121  BNP 22.6    CBG:  Recent Labs Lab 03/08/16 1555 03/09/16 2000 03/10/16 0405 03/10/16 1150  GLUCAP 521* 422* 94 273*      Studies/Results: Ct Angio Head W/cm &/or Wo Cm  03/10/2016  CLINICAL DATA:  Presyncopal episode. Confusion versus seizure. The patient develops slurred speech during these episodes. EXAM: CT ANGIOGRAPHY HEAD AND NECK TECHNIQUE: Multidetector CT imaging of the head and neck was performed using the standard protocol during bolus administration of intravenous contrast. Multiplanar CT image reconstructions and MIPs were obtained to evaluate the vascular anatomy. Carotid stenosis measurements (when applicable) are obtained utilizing NASCET criteria, using the distal internal carotid diameter as the denominator. CONTRAST:  50 mL Isovue 370 COMPARISON:  MRI  brain 01/15/2016. FINDINGS: CT HEAD Brain: Mild periventricular and subcortical white matter changes are stable. No acute cortical infarct, hemorrhage, or mass lesion is present. The ventricles are of normal size. The basal ganglia are intact. The insular ribbon is normal bilaterally. Calvarium and skull base: Within normal limits. Paranasal sinuses: Clear Orbits: Unremarkable CTA NECK Aortic arch: A 3 vessel arch configuration is present. Mural thickening is present at the aortic arch without focal stenosis or aneurysm. Right carotid system: The right common carotid artery is within normal limits. There is some atherosclerotic irregularity at the bifurcation without a significant stenosis. The cervical right ICA  is normal. Left carotid system: Wall thickening is evident along the anteromedial border of the distal left common carotid artery. Mild atherosclerotic changes are noted at the carotid bifurcation. The cervical left ICA is otherwise normal. Vertebral arteries:The vertebral arteries both originate from the subclavian arteries. Right vertebral artery is the dominant vessel. No focal stenosis or vascular injury is evident in either vertebral artery within the neck. Skeleton: Endplate degenerative changes and uncovertebral spurring are most evident at C3-4 and C6-7. There is a osseous foraminal narrowing at both of these levels. The bone windows are otherwise unremarkable. There is slight anterior translation of the right mandibular condyle compared to the left. No focal lytic or blastic lesions are present. Other neck: No focal mucosal or submucosal lesions are present. The vocal cords are midline and symmetric. The tongue base is unremarkable. No significant cervical adenopathy is present. CTA HEAD Anterior circulation: Mild atherosclerotic irregularity in calcifications are present within the cavernous internal carotid arteries bilaterally. The ICA termini are intact. The right A1 segment is dominant. The A1  and M1 segments are intact. The MCA bifurcations are within normal limits bilaterally. There is some attenuation of distal MCA and ACA branch vessels bilaterally. No focal branch vessel occlusion is evident. The anterior communicating artery is patent. Posterior circulation: Atherosclerotic calcifications are evident at the dural margin of both vertebral arteries. There is moderate stenosis on the left. PICA origins are visualized and normal. The vertebrobasilar junction is intact. The basilar artery is normal. The right vertebral artery originates from the basilar tip. The left vertebral artery is predominantly from the posterior communicating artery with contribution from a left P1 segment. There is some attenuation of distal PCA branch vessels. Venous sinuses: The dural sinuses are patent. The right transverse sinus is dominant. The straight sinus and deep cerebral veins are intact. The cortical veins are intact. Anatomic variants: Fetal type left posterior cerebral artery with contribution from the P1 segment. Delayed phase: The postcontrast images demonstrate no pathologic enhancement. IMPRESSION: 1. Atherosclerotic changes with predominantly noncalcified plaque at the aortic arch, the distal left common carotid artery, both carotid bifurcations, and the cavernous internal carotid arteries. There are no significant focal stenoses. 2. Mild distal small vessel intracranial atherosclerotic changes in both the anterior and posterior circulation. 3. No significant proximal stenosis, aneurysm, or branch vessel occlusion within the circle of Willis. 4. Endplate degenerative changes and uncovertebral spurring at C3-4 and C6-7. Electronically Signed   By: San Morelle [name]   On: 03/10/2016 08:00   Ct Angio Neck W/cm &/or Wo/cm  03/10/2016  CLINICAL DATA:  Presyncopal episode. Confusion versus seizure. The patient develops slurred speech during these episodes. EXAM: CT ANGIOGRAPHY HEAD AND NECK TECHNIQUE:  Multidetector CT imaging of the head and neck was performed using the standard protocol during bolus administration of intravenous contrast. Multiplanar CT image reconstructions and MIPs were obtained to evaluate the vascular anatomy. Carotid stenosis measurements (when applicable) are obtained utilizing NASCET criteria, using the distal internal carotid diameter as the denominator. CONTRAST:  50 mL Isovue 370 COMPARISON:  MRI brain 01/15/2016. FINDINGS: CT HEAD Brain: Mild periventricular and subcortical white matter changes are stable. No acute cortical infarct, hemorrhage, or mass lesion is present. The ventricles are of normal size. The basal ganglia are intact. The insular ribbon is normal bilaterally. Calvarium and skull base: Within normal limits. Paranasal sinuses: Clear Orbits: Unremarkable CTA NECK Aortic arch: A 3 vessel arch configuration is present. Mural thickening is present at the aortic arch without  focal stenosis or aneurysm. Right carotid system: The right common carotid artery is within normal limits. There is some atherosclerotic irregularity at the bifurcation without a significant stenosis. The cervical right ICA is normal. Left carotid system: Wall thickening is evident along the anteromedial border of the distal left common carotid artery. Mild atherosclerotic changes are noted at the carotid bifurcation. The cervical left ICA is otherwise normal. Vertebral arteries:The vertebral arteries both originate from the subclavian arteries. Right vertebral artery is the dominant vessel. No focal stenosis or vascular injury is evident in either vertebral artery within the neck. Skeleton: Endplate degenerative changes and uncovertebral spurring are most evident at C3-4 and C6-7. There is a osseous foraminal narrowing at both of these levels. The bone windows are otherwise unremarkable. There is slight anterior translation of the right mandibular condyle compared to the left. No focal lytic or blastic  lesions are present. Other neck: No focal mucosal or submucosal lesions are present. The vocal cords are midline and symmetric. The tongue base is unremarkable. No significant cervical adenopathy is present. CTA HEAD Anterior circulation: Mild atherosclerotic irregularity in calcifications are present within the cavernous internal carotid arteries bilaterally. The ICA termini are intact. The right A1 segment is dominant. The A1 and M1 segments are intact. The MCA bifurcations are within normal limits bilaterally. There is some attenuation of distal MCA and ACA branch vessels bilaterally. No focal branch vessel occlusion is evident. The anterior communicating artery is patent. Posterior circulation: Atherosclerotic calcifications are evident at the dural margin of both vertebral arteries. There is moderate stenosis on the left. PICA origins are visualized and normal. The vertebrobasilar junction is intact. The basilar artery is normal. The right vertebral artery originates from the basilar tip. The left vertebral artery is predominantly from the posterior communicating artery with contribution from a left P1 segment. There is some attenuation of distal PCA branch vessels. Venous sinuses: The dural sinuses are patent. The right transverse sinus is dominant. The straight sinus and deep cerebral veins are intact. The cortical veins are intact. Anatomic variants: Fetal type left posterior cerebral artery with contribution from the P1 segment. Delayed phase: The postcontrast images demonstrate no pathologic enhancement. IMPRESSION: 1. Atherosclerotic changes with predominantly noncalcified plaque at the aortic arch, the distal left common carotid artery, both carotid bifurcations, and the cavernous internal carotid arteries. There are no significant focal stenoses. 2. Mild distal small vessel intracranial atherosclerotic changes in both the anterior and posterior circulation. 3. No significant proximal stenosis, aneurysm,  or branch vessel occlusion within the circle of Willis. 4. Endplate degenerative changes and uncovertebral spurring at C3-4 and C6-7. Electronically Signed   By: San Morelle [name]   On: 03/10/2016 08:00   Mr Jeri Cos [id number] Contrast  03/10/2016  CLINICAL DATA:  Presyncope EXAM: MRI HEAD WITHOUT AND WITH CONTRAST TECHNIQUE: Multiplanar, multiecho pulse sequences of the brain and surrounding structures were obtained without and with intravenous contrast. CONTRAST:  25mL MULTIHANCE GADOBENATE DIMEGLUMINE 529 MG/ML IV SOLN COMPARISON:  01/15/2016 FINDINGS: Calvarium and upper cervical spine: No focal marrow signal abnormality. Orbits: Negative. Sinuses and Mastoids: Clear. Brain: No acute abnormality such as acute infarct, hemorrhage, hydrocephalus, or mass lesion. No evidence of large vessel occlusion. Patchy FLAIR hyperintensity in the cerebral white matter compatible with chronic vessel disease, mild for age. IMPRESSION: 1. No acute finding or explanation for the history. 2. Mild chronic small vessel disease. Electronically Signed   By: Monte Fantasia [name]   On: 03/10/2016 08:49  Medications:  Scheduled: . aspirin EC  81 mg Oral Daily  . atorvastatin  40 mg Oral q1800  . diclofenac sodium  2 g Topical TID AC & HS  . enoxaparin (LOVENOX) injection  40 mg Subcutaneous Daily  . insulin aspart  0-15 Units Subcutaneous TID WC  . insulin glargine  42 Units Subcutaneous QHS  . isosorbide mononitrate  120 mg Oral Daily  . levothyroxine  75 mcg Oral QAC breakfast  . metFORMIN  1,000 mg Oral BID WC  . metoprolol  12.5 mg Oral BID  . pantoprazole  40 mg Oral Daily  . pramipexole  0.5-1 mg Oral QHS  . sertraline  100 mg Oral BID  . sodium chloride flush  3 mL Intravenous Q12H  . topiramate  50 mg Oral QHS  . [START ON 03/12/2016] Vitamin D (Ergocalciferol)  50,000 Units Oral Q Fri   Continuous: . sodium chloride Stopped (03/10/16 1015)  . sodium chloride 125 mL/hr at 03/10/16 1015    [id number], hyoscyamine, nitroGLYCERIN, ondansetron **OR** ondansetron (ZOFRAN) IV, tiZANidine  Assessment/Plan:  Principal Problem:   Pre-syncope Active Problems:   Depression   GERD   Anemia   OSA (obstructive sleep apnea)   Essential hypertension   Carotid artery disease ([facility])   Diabetes mellitus, type 2 ([facility])   Confusion    Presyncope/transient confusion Etiology for symptoms not entirely clear. Neurology is following. MRI does not show any stroke. No significant stenosis noted in the carotid system on CT angiogram. Echocardiogram is pending. EEG does not show any seizure activity. PT and OT evaluation. Await further neurology input.   Essential hypertension Continue metoprolol. Blood pressure is well controlled. He is noted to be bradycardic, most likely due to the beta blocker. Monitor for now.  Diabetes mellitus type 2 Monitor CBGs. Appears to have quite brittle control of his diabetes considering fluctuations and CBG. Immune no insulin. Sliding scale coverage. Hold metformin as he received intravenous contrast.  Hypothyroidism Continue Synthroid.  History of depression Continue home medications.  Normocytic anemia Hemoglobin is stable. No overt bleeding. Outpatient regimen.   DVT Prophylaxis: Lovenox    Code Status: Full code  Family Communication: Discussed with patient  Disposition Plan: Await neurology input. Await workup to be completed. Mobilize.      [facility]  [facility] Pager [phone] 03/10/2016, 1:33 PM  If 7PM-7AM, please contact night-coverage at [email or web address], password [facility]

## 2016-03-10 NOTE — Progress Notes (Signed)
EEG Completed; Results Pending  

## 2016-03-11 DIAGNOSIS — R55 Syncope and collapse: Secondary | ICD-10-CM | POA: Diagnosis not present

## 2016-03-11 LAB — CBC
HEMATOCRIT: 36.5 % — AB (ref 39.0–52.0)
HEMOGLOBIN: 12.6 g/dL — AB (ref 13.0–17.0)
MCH: 30.4 pg (ref 26.0–34.0)
MCHC: 34.5 g/dL (ref 30.0–36.0)
MCV: 88 fL (ref 78.0–100.0)
Platelets: 153 10*3/uL (ref 150–400)
RBC: 4.15 MIL/uL — ABNORMAL LOW (ref 4.22–5.81)
RDW: 13.2 % (ref 11.5–15.5)
WBC: 6.6 10*3/uL (ref 4.0–10.5)

## 2016-03-11 LAB — BASIC METABOLIC PANEL
ANION GAP: 11 (ref 5–15)
BUN: 10 mg/dL (ref 6–20)
CALCIUM: 9.3 mg/dL (ref 8.9–10.3)
CHLORIDE: 107 mmol/L (ref 101–111)
CO2: 22 mmol/L (ref 22–32)
CREATININE: 0.99 mg/dL (ref 0.61–1.24)
GFR calc Af Amer: >60 mL/min (ref >60)
GFR calc non Af Amer: >60 mL/min (ref >60)
Glucose, Bld: 209 mg/dL — ABNORMAL HIGH (ref 65–99)
Potassium: 3.9 mmol/L (ref 3.5–5.1)
SODIUM: 140 mmol/L (ref 135–145)

## 2016-03-11 LAB — HEMOGLOBIN A1C
HEMOGLOBIN A1C: 10 % — AB (ref 4.8–5.6)
MEAN PLASMA GLUCOSE: 240 mg/dL

## 2016-03-11 LAB — GLUCOSE, CAPILLARY: GLUCOSE-CAPILLARY: 219 mg/dL — AB (ref 65–99)

## 2016-03-11 MED ORDER — METOPROLOL TARTRATE 25 MG PO TABS: 12.5000 mg | ORAL_TABLET | Freq: Every day | ORAL | Status: DC

## 2016-03-11 MED ORDER — BACLOFEN 10 MG PO TABS: 5.0000 mg | ORAL_TABLET | Freq: Every day | ORAL | Status: DC

## 2016-03-11 MED ORDER — CLOPIDOGREL BISULFATE 75 MG PO TABS: 75.0000 mg | ORAL_TABLET | Freq: Every day | ORAL | Status: DC

## 2016-03-11 NOTE — Care Management Obs Status (Signed)
[place] NOTIFICATION   Patient Details  Name: [name] [name] MRN: [id number] Date of Birth: [birth year]   Medicare Observation Status Notification Given:  No (Pt d/c before letter could be provided. )    Bethena Roys, RN 03/11/2016, 10:45 AM

## 2016-03-11 NOTE — Progress Notes (Signed)
12.5 of metoprolol held. Bradycardic HR 52

## 2016-03-11 NOTE — Progress Notes (Signed)
Discharge teaching and instructions reviewed. Prescriptions given. Pt discharging home with wife and daughter. No further questions at this time.

## 2016-03-11 NOTE — Discharge Instructions (Signed)
Confusion Confusion is the inability to think with your usual speed or clarity. Confusion may come on quickly or slowly over time. How quickly the confusion comes on depends on the cause. Confusion can be due to any number of causes. CAUSES   Concussion, head injury, or head trauma.  Seizures.  Stroke.  Fever.  Brain tumor.  Age related decreased brain function (dementia).  Heightened emotional states like rage or terror.  Mental illness in which the person loses the ability to determine what is real and what is not (hallucinations).  Infections such as a urinary tract infection (UTI).  Toxic effects from alcohol, drugs, or prescription medicines.  Dehydration and an imbalance of salts in the body (electrolytes).  Lack of sleep.  Low blood sugar (diabetes).  Low levels of oxygen from conditions such as chronic lung disorders.  Drug interactions or other medicine side effects.  Nutritional deficiencies, especially niacin, thiamine, vitamin C, or vitamin B.  Sudden drop in body temperature (hypothermia).  Change in routine, such as when traveling or hospitalized. SIGNS AND SYMPTOMS  People often describe their thinking as cloudy or unclear when they are confused. Confusion can also include feeling disoriented. That means you are unaware of where or who you are. You may also not know what the date or time is. If confused, you may also have difficulty paying attention, remembering, and making decisions. Some people also act aggressively when they are confused.  DIAGNOSIS  The medical evaluation of confusion may include:  Blood and urine tests.  X-rays.  Brain and nervous system tests.  Analyzing your brain waves (electroencephalogram or EEG).  Magnetic resonance imaging (MRI) of your head.  Computed tomography (CT) scan of your head.  Mental status tests in which your health care provider may ask many questions. Some of these questions may seem silly or strange,  but they are a very important test to help diagnose and treat confusion. TREATMENT  An admission to the hospital may not be needed, but a person with confusion should not be left alone. Stay with a family member or friend until the confusion clears. Avoid alcohol, pain relievers, or sedative drugs until you have fully recovered. Do not drive until directed by your health care provider. HOME CARE INSTRUCTIONS  What family and friends can do:  To find out if someone is confused, ask the person to state his or her name, age, and the date. If the person is unsure or answers incorrectly, he or she is confused.  Always introduce yourself, no matter how well the person knows you.  Often remind the person of his or her location.  Place a calendar and clock near the confused person.  Help the person with his or her medicines. You may want to use a pill box, an alarm as a reminder, or give the person each dose as prescribed.  Talk about current events and plans for the day.  Try to keep the environment calm, quiet, and peaceful.  Make sure the person keeps follow-up visits with his or her health care provider. PREVENTION  Ways to prevent confusion:  Avoid alcohol.  Eat a balanced diet.  Get enough sleep.  Take medicine only as directed by your health care provider.  Do not become isolated. Spend time with other people and make plans for your days.  Keep careful watch on your blood sugar levels if you are diabetic. SEEK IMMEDIATE MEDICAL CARE IF:   You develop severe headaches, repeated vomiting, seizures, blackouts, or  slurred speech.  There is increasing confusion, weakness, numbness, restlessness, or personality changes.  You develop a loss of balance, have marked dizziness, feel uncoordinated, or fall.  You have delusions, hallucinations, or develop severe anxiety.  Your family members think you need to be rechecked.   This information is not intended to replace advice given  to you by your health care provider. Make sure you discuss any questions you have with your health care provider.   Document Released: 12/30/2004 Document Revised: 12/13/2014 Document Reviewed: 12/28/2013 Elsevier Interactive Patient Education 2016 [employer]   Transient Ischemic Attack A transient ischemic attack (TIA) is a "warning stroke" that causes stroke-like symptoms. Unlike a stroke, a TIA does not cause permanent damage to the brain. The symptoms of a TIA can happen very fast and do not last long. It is important to know the symptoms of a TIA and what to do. This can help prevent a major stroke or death. CAUSES  A TIA is caused by a temporary blockage in an artery in the brain or neck (carotid artery). The blockage does not allow the brain to get the blood supply it needs and can cause different symptoms. The blockage can be caused by either:  A blood clot.  Fatty buildup (plaque) in a neck or brain artery. RISK FACTORS  High blood pressure (hypertension).  High cholesterol.  Diabetes mellitus.  Heart disease.  The buildup of plaque in the blood vessels (peripheral artery disease or atherosclerosis).  The buildup of plaque in the blood vessels that provide blood and oxygen to the brain (carotid artery stenosis).  An abnormal heart rhythm (atrial fibrillation).  Obesity.  Using any tobacco products, including cigarettes, chewing tobacco, or electronic cigarettes.  Taking oral contraceptives, especially in combination with using tobacco.  Physical inactivity.  A diet high in fats, salt (sodium), and calories.  Excessive alcohol use.  Use of illegal drugs (especially cocaine and methamphetamine).  Being male.  Being African American.  Being over the age of 36 years.  Family history of stroke.  Previous history of blood clots, stroke, TIA, or heart attack.  Sickle cell disease. SIGNS AND SYMPTOMS  TIA symptoms are the same as a stroke but are  temporary. These symptoms usually develop suddenly, or may be newly present upon waking from sleep:  Sudden weakness or numbness of the face, arm, or leg, especially on one side of the body.  Sudden trouble walking or difficulty moving arms or legs.  Sudden confusion.  Sudden personality changes.  Trouble speaking (aphasia) or understanding.  Difficulty swallowing.  Sudden trouble seeing in one or both eyes.  Double vision.  Dizziness.  Loss of balance or coordination.  Sudden severe headache with no known cause.  Trouble reading or writing.  Loss of bowel or bladder control.  Loss of consciousness. DIAGNOSIS  Your health care provider may be able to determine the presence or absence of a TIA based on your symptoms, history, and physical exam. CT scan of the brain is usually performed to help identify a TIA. Other tests may include:  Electrocardiography (ECG).  Continuous heart monitoring.  Echocardiography.  Carotid ultrasonography.  MRI.  A scan of the brain circulation.  Blood tests. TREATMENT  Since the symptoms of TIA are the same as a stroke, it is important to seek treatment as soon as possible. You may need a medicine to dissolve a blood clot (thrombolytic) if that is the cause of the TIA. This medicine cannot be given if too  much time has passed. Treatment may also include:   Rest, oxygen, fluids through an IV tube, and medicines to thin the blood (anticoagulants).  Measures will be taken to prevent short-term and long-term complications, including infection from breathing foreign material into the lungs (aspiration pneumonia), blood clots in the legs, and falls.  Procedures to either remove plaque in the carotid arteries or dilate carotid arteries that have narrowed due to plaque. Those procedures are:  Carotid endarterectomy.  Carotid angioplasty and stenting.  Medicines and diet may be used to address diabetes, high blood pressure, and other  underlying risk factors. HOME CARE INSTRUCTIONS   Take medicines only as directed by your health care provider. Follow the directions carefully. Medicines may be used to control risk factors for a stroke. Be sure you understand all your medicine instructions.  You may be told to take aspirin or the anticoagulant warfarin. Warfarin needs to be taken exactly as instructed.  Taking too much or too little warfarin is dangerous. Too much warfarin increases the risk of bleeding. Too little warfarin continues to allow the risk for blood clots. While taking warfarin, you will need to have regular blood tests to measure your blood clotting time. A PT blood test measures how long it takes for blood to clot. Your PT is used to calculate another value called an INR. Your PT and INR help your health care provider to adjust your dose of warfarin. The dose can change for many reasons. It is critically important that you take warfarin exactly as prescribed.  Many foods, especially foods high in vitamin K can interfere with warfarin and affect the PT and INR. Foods high in vitamin K include spinach, kale, broccoli, cabbage, collard and turnip greens, Brussels sprouts, peas, cauliflower, seaweed, and parsley, as well as beef and pork liver, green tea, and soybean oil. You should eat a consistent amount of foods high in vitamin K. Avoid major changes in your diet, or notify your health care provider before changing your diet. Arrange a visit with a dietitian to answer your questions.  Many medicines can interfere with warfarin and affect the PT and INR. You must tell your health care provider about any and all medicines you take; this includes all vitamins and supplements. Be especially cautious with aspirin and anti-inflammatory medicines. Do not take or discontinue any prescribed or over-the-counter medicine except on the advice of your health care provider or pharmacist.  Warfarin can have side effects, such as  excessive bruising or bleeding. You will need to hold pressure over cuts for longer than usual. Your health care provider or pharmacist will discuss other potential side effects.  Avoid sports or activities that may cause injury or bleeding.  Be careful when shaving, flossing your teeth, or handling sharp objects.  Alcohol can change the body's ability to handle warfarin. It is best to avoid alcoholic drinks or consume only very small amounts while taking warfarin. Notify your health care provider if you change your alcohol intake.  Notify your dentist or other health care providers before procedures.  Eat a diet that includes 5 or more servings of fruits and vegetables each day. This may reduce the risk of stroke. Certain diets may be prescribed to address high blood pressure, high cholesterol, diabetes, or obesity.  A diet low in sodium, saturated fat, trans fat, and cholesterol is recommended to manage high blood pressure.  A diet low in saturated fat, trans fat, and cholesterol, and high in fiber may control cholesterol levels.  A controlled-carbohydrate, controlled-sugar diet is recommended to manage diabetes.  A reduced-calorie diet that is low in sodium, saturated fat, trans fat, and cholesterol is recommended to manage obesity.  Maintain a healthy weight.  Stay physically active. It is recommended that you get at least 30 minutes of activity on most or all days.  Do not use any tobacco products, including cigarettes, chewing tobacco, or electronic cigarettes. If you need help quitting, ask your health care provider.  Limit alcohol intake to no more than 1 drink per day for nonpregnant women and 2 drinks per day for men. One drink equals 12 ounces of beer, 5 ounces of wine, or 1 ounces of hard liquor.  Do not abuse drugs.  A safe home environment is important to reduce the risk of falls. Your health care provider may arrange for specialists to evaluate your home. Having grab  bars in the bedroom and bathroom is often important. Your health care provider may arrange for equipment to be used at home, such as raised toilets and a seat for the shower.  Follow all instructions for follow-up with your health care provider. This is very important. This includes any referrals and lab tests. Proper follow-up can prevent a stroke or another TIA from occurring. PREVENTION  The risk of a TIA can be decreased by appropriately treating high blood pressure, high cholesterol, diabetes, heart disease, and obesity, and by quitting smoking, limiting alcohol, and staying physically active. SEEK MEDICAL CARE IF:  You have personality changes.  You have difficulty swallowing.  You are seeing double.  You have dizziness.  You have a fever. SEEK IMMEDIATE MEDICAL CARE IF:  Any of the following symptoms may represent a serious problem that is an emergency. Do not wait to see if the symptoms will go away. Get medical help right away. Call your local emergency services (911 in [place] Do not drive yourself to the hospital.  You have sudden weakness or numbness of the face, arm, or leg, especially on one side of the body.  You have sudden trouble walking or difficulty moving arms or legs.  You have sudden confusion.  You have trouble speaking (aphasia) or understanding.  You have sudden trouble seeing in one or both eyes.  You have a loss of balance or coordination.  You have a sudden, severe headache with no known cause.  You have new chest pain or an irregular heartbeat.  You have a partial or total loss of consciousness. MAKE SURE YOU:   Understand these instructions.  Will watch your condition.  Will get help right away if you are not doing well or get worse.   This information is not intended to replace advice given to you by your health care provider. Make sure you discuss any questions you have with your health care provider.   Document Released: 09/01/2005  Document Revised: 12/13/2014 Document Reviewed: 02/27/2014 Elsevier Interactive Patient Education [employer].

## 2016-03-11 NOTE — Discharge Summary (Signed)
[facility]  Physician Discharge Summary   Patient ID: [name] [name] MRN: [id number] DOB/AGE: June 24, 1946 70 y.o.  Admit date: 03/09/2016 Discharge date: 03/11/2016  PCP: Penni Homans, MD  DISCHARGE DIAGNOSES:  Principal Problem:   Pre-syncope Active Problems:   Depression   GERD   Anemia   OSA (obstructive sleep apnea)   Essential hypertension   Carotid artery disease (HCC)   Diabetes mellitus, type 2 (HCC)   Confusion   Altered mental status   RECOMMENDATIONS FOR OUTPATIENT FOLLOW UP: 1. Patient asked to follow up with his PCP next week 2. Aspirin has been changed to Plavix due to possibility of TIA 3. Dose of metoprolol has been reduced   DISCHARGE CONDITION: fair  Diet recommendation: Heart healthy/modified carbohydrate  Filed Weights   03/09/16 1956 03/10/16 1000 03/11/16 0512  Weight: 81.647 kg (180 lb) 80.695 kg (177 lb 14.4 oz) 79.289 kg (174 lb 12.8 oz)    INITIAL HISTORY: 70 year old Caucasian male with past medical history of type 2 diabetes, obstructive sleep apnea, hypertension, coronary artery disease, presented with 2 episodes of altered mental status, slurred speech. Denies any new medications. Denies changes to doses of any of his old medications. Denies any recent illness or sickness.  Consultants: Neurology  Procedures:  EEG "Impression: This is a normal routine EEG of the awake and asleep states, with activating procedures. A normal study does not rule out the possibility of a seizure disorder in this patient."  Transthoracic Echocardiogram Study Conclusions - Left ventricle: The cavity size was normal. There was mild concentric hypertrophy. Systolic function was normal. The estimated ejection fraction was in the range of 55% to 60%. Wall motion was normal; there were no regional wall motion abnormalities. There was an increased relative contribution of atrial contraction to ventricular filling. Doppler parameters are consistent  with abnormal left ventricular relaxation (grade 1 diastolic dysfunction). - Mitral valve: There was mild regurgitation. - Left atrium: The atrium was mildly dilated.   HOSPITAL COURSE:   Presyncope/transient confusion Etiology for symptoms not entirely clear. Patient is back to baseline. Neurology was consulted. MRI does not show any stroke. No significant stenosis noted in the carotid system on CT angiogram. Echocardiogram is as above. EEG does not show any seizure activity. A shin has been ambulating without any difficulty. Telemetry did not show any arrhythmias. TIA is still a possibility. Since no other etiology has been found we recommend changing aspirin to Plavix and follow-up with outpatient providers.   Essential hypertension He is noted to be bradycardic, most likely due to the beta blocker. We will decrease the dose of his beta blocker.  Diabetes mellitus type 2 Appears to have quite brittle control of his diabetes considering fluctuations and CBG. He may continue his home medication regimen. HbA1c is 10.0.  Hypothyroidism Continue Synthroid. TSH was noted to be slightly elevated at 5.2. Would not recommend changing his dose.  History of depression Continue home medications.  Normocytic anemia Hemoglobin is stable. No overt bleeding. Outpatient regimen.  Overall, patient is stable. He is back to his baseline. He will follow-up with his PCP. Okay for discharge home today.   PERTINENT LABS:  The results of significant diagnostics from this hospitalization (including imaging, microbiology, ancillary and laboratory) are listed below for reference.     Labs: Basic Metabolic Panel:  Recent Labs Lab 03/08/16 1728 03/09/16 2036 03/10/16 0550 03/11/16 0344  NA 134* 134* 142 140  K 4.7 4.4 3.4* 3.9  CL 100* 105 108 107  CO2 26 17* 24 22  GLUCOSE 450* 445* 86 209*  BUN 15 15 12 10   CREATININE 1.18 1.25* 1.05 0.99  CALCIUM 9.1 8.6* 8.7* 9.3  MG  --   --  1.8  --     PHOS  --   --  4.0  --    Liver Function Tests:  Recent Labs Lab 03/08/16 1728 03/09/16 2036 03/10/16 0550  AST 26 23 20   ALT 34 29 26  ALKPHOS 82 89 61  BILITOT 0.7 0.8 0.7  PROT 7.2 5.7* 5.7*  ALBUMIN 4.3 3.5 3.4*   CBC:  Recent Labs Lab 03/08/16 1728 03/09/16 2036 03/10/16 0550 03/11/16 0344  WBC 6.6 8.0 6.1 6.6  NEUTROABS 4.0 5.2 3.4  --   HGB 13.5 12.2* 11.8* 12.6*  HCT 38.3* 34.0* 34.3* 36.5*  MCV 89.5 88.3 87.5 88.0  PLT 148* 144* 138* 153   Cardiac Enzymes:  Recent Labs Lab 03/08/16 1728 03/09/16 2036  TROPONINI <0.03 <0.03   CBG:  Recent Labs Lab 03/10/16 0405 03/10/16 1150 03/10/16 1658 03/10/16 2107 03/11/16 0742  GLUCAP 94 273* 143* 266* 219*     IMAGING STUDIES Ct Angio Head W/cm &/or Wo Cm  03/10/2016  CLINICAL DATA:  Presyncopal episode. Confusion versus seizure. The patient develops slurred speech during these episodes. EXAM: CT ANGIOGRAPHY HEAD AND NECK TECHNIQUE: Multidetector CT imaging of the head and neck was performed using the standard protocol during bolus administration of intravenous contrast. Multiplanar CT image reconstructions and MIPs were obtained to evaluate the vascular anatomy. Carotid stenosis measurements (when applicable) are obtained utilizing NASCET criteria, using the distal internal carotid diameter as the denominator. CONTRAST:  50 mL Isovue 370 COMPARISON:  MRI brain 01/15/2016. FINDINGS: CT HEAD Brain: Mild periventricular and subcortical white matter changes are stable. No acute cortical infarct, hemorrhage, or mass lesion is present. The ventricles are of normal size. The basal ganglia are intact. The insular ribbon is normal bilaterally. Calvarium and skull base: Within normal limits. Paranasal sinuses: Clear Orbits: Unremarkable CTA NECK Aortic arch: A 3 vessel arch configuration is present. Mural thickening is present at the aortic arch without focal stenosis or aneurysm. Right carotid system: The right common  carotid artery is within normal limits. There is some atherosclerotic irregularity at the bifurcation without a significant stenosis. The cervical right ICA is normal. Left carotid system: Wall thickening is evident along the anteromedial border of the distal left common carotid artery. Mild atherosclerotic changes are noted at the carotid bifurcation. The cervical left ICA is otherwise normal. Vertebral arteries:The vertebral arteries both originate from the subclavian arteries. Right vertebral artery is the dominant vessel. No focal stenosis or vascular injury is evident in either vertebral artery within the neck. Skeleton: Endplate degenerative changes and uncovertebral spurring are most evident at C3-4 and C6-7. There is a osseous foraminal narrowing at both of these levels. The bone windows are otherwise unremarkable. There is slight anterior translation of the right mandibular condyle compared to the left. No focal lytic or blastic lesions are present. Other neck: No focal mucosal or submucosal lesions are present. The vocal cords are midline and symmetric. The tongue base is unremarkable. No significant cervical adenopathy is present. CTA HEAD Anterior circulation: Mild atherosclerotic irregularity in calcifications are present within the cavernous internal carotid arteries bilaterally. The ICA termini are intact. The right A1 segment is dominant. The A1 and M1 segments are intact. The MCA bifurcations are within normal limits bilaterally. There is some attenuation of distal MCA  and ACA branch vessels bilaterally. No focal branch vessel occlusion is evident. The anterior communicating artery is patent. Posterior circulation: Atherosclerotic calcifications are evident at the dural margin of both vertebral arteries. There is moderate stenosis on the left. PICA origins are visualized and normal. The vertebrobasilar junction is intact. The basilar artery is normal. The right vertebral artery originates from the  basilar tip. The left vertebral artery is predominantly from the posterior communicating artery with contribution from a left P1 segment. There is some attenuation of distal PCA branch vessels. Venous sinuses: The dural sinuses are patent. The right transverse sinus is dominant. The straight sinus and deep cerebral veins are intact. The cortical veins are intact. Anatomic variants: Fetal type left posterior cerebral artery with contribution from the P1 segment. Delayed phase: The postcontrast images demonstrate no pathologic enhancement. IMPRESSION: 1. Atherosclerotic changes with predominantly noncalcified plaque at the aortic arch, the distal left common carotid artery, both carotid bifurcations, and the cavernous internal carotid arteries. There are no significant focal stenoses. 2. Mild distal small vessel intracranial atherosclerotic changes in both the anterior and posterior circulation. 3. No significant proximal stenosis, aneurysm, or branch vessel occlusion within the circle of Willis. 4. Endplate degenerative changes and uncovertebral spurring at C3-4 and C6-7. Electronically Signed   By: San Morelle [name]   On: 03/10/2016 08:00   Ct Angio Neck W/cm &/or Wo/cm  03/10/2016  CLINICAL DATA:  Presyncopal episode. Confusion versus seizure. The patient develops slurred speech during these episodes. EXAM: CT ANGIOGRAPHY HEAD AND NECK TECHNIQUE: Multidetector CT imaging of the head and neck was performed using the standard protocol during bolus administration of intravenous contrast. Multiplanar CT image reconstructions and MIPs were obtained to evaluate the vascular anatomy. Carotid stenosis measurements (when applicable) are obtained utilizing NASCET criteria, using the distal internal carotid diameter as the denominator. CONTRAST:  50 mL Isovue 370 COMPARISON:  MRI brain 01/15/2016. FINDINGS: CT HEAD Brain: Mild periventricular and subcortical white matter changes are stable. No acute cortical  infarct, hemorrhage, or mass lesion is present. The ventricles are of normal size. The basal ganglia are intact. The insular ribbon is normal bilaterally. Calvarium and skull base: Within normal limits. Paranasal sinuses: Clear Orbits: Unremarkable CTA NECK Aortic arch: A 3 vessel arch configuration is present. Mural thickening is present at the aortic arch without focal stenosis or aneurysm. Right carotid system: The right common carotid artery is within normal limits. There is some atherosclerotic irregularity at the bifurcation without a significant stenosis. The cervical right ICA is normal. Left carotid system: Wall thickening is evident along the anteromedial border of the distal left common carotid artery. Mild atherosclerotic changes are noted at the carotid bifurcation. The cervical left ICA is otherwise normal. Vertebral arteries:The vertebral arteries both originate from the subclavian arteries. Right vertebral artery is the dominant vessel. No focal stenosis or vascular injury is evident in either vertebral artery within the neck. Skeleton: Endplate degenerative changes and uncovertebral spurring are most evident at C3-4 and C6-7. There is a osseous foraminal narrowing at both of these levels. The bone windows are otherwise unremarkable. There is slight anterior translation of the right mandibular condyle compared to the left. No focal lytic or blastic lesions are present. Other neck: No focal mucosal or submucosal lesions are present. The vocal cords are midline and symmetric. The tongue base is unremarkable. No significant cervical adenopathy is present. CTA HEAD Anterior circulation: Mild atherosclerotic irregularity in calcifications are present within the cavernous internal carotid arteries bilaterally. The  ICA termini are intact. The right A1 segment is dominant. The A1 and M1 segments are intact. The MCA bifurcations are within normal limits bilaterally. There is some attenuation of distal MCA and  ACA branch vessels bilaterally. No focal branch vessel occlusion is evident. The anterior communicating artery is patent. Posterior circulation: Atherosclerotic calcifications are evident at the dural margin of both vertebral arteries. There is moderate stenosis on the left. PICA origins are visualized and normal. The vertebrobasilar junction is intact. The basilar artery is normal. The right vertebral artery originates from the basilar tip. The left vertebral artery is predominantly from the posterior communicating artery with contribution from a left P1 segment. There is some attenuation of distal PCA branch vessels. Venous sinuses: The dural sinuses are patent. The right transverse sinus is dominant. The straight sinus and deep cerebral veins are intact. The cortical veins are intact. Anatomic variants: Fetal type left posterior cerebral artery with contribution from the P1 segment. Delayed phase: The postcontrast images demonstrate no pathologic enhancement. IMPRESSION: 1. Atherosclerotic changes with predominantly noncalcified plaque at the aortic arch, the distal left common carotid artery, both carotid bifurcations, and the cavernous internal carotid arteries. There are no significant focal stenoses. 2. Mild distal small vessel intracranial atherosclerotic changes in both the anterior and posterior circulation. 3. No significant proximal stenosis, aneurysm, or branch vessel occlusion within the circle of Willis. 4. Endplate degenerative changes and uncovertebral spurring at C3-4 and C6-7. Electronically Signed   By: San Morelle [name]   On: 03/10/2016 08:00   Mr Jeri Cos [id number] Contrast  03/10/2016  CLINICAL DATA:  Presyncope EXAM: MRI HEAD WITHOUT AND WITH CONTRAST TECHNIQUE: Multiplanar, multiecho pulse sequences of the brain and surrounding structures were obtained without and with intravenous contrast. CONTRAST:  23mL MULTIHANCE GADOBENATE DIMEGLUMINE 529 MG/ML IV SOLN COMPARISON:  01/15/2016  FINDINGS: Calvarium and upper cervical spine: No focal marrow signal abnormality. Orbits: Negative. Sinuses and Mastoids: Clear. Brain: No acute abnormality such as acute infarct, hemorrhage, hydrocephalus, or mass lesion. No evidence of large vessel occlusion. Patchy FLAIR hyperintensity in the cerebral white matter compatible with chronic vessel disease, mild for age. IMPRESSION: 1. No acute finding or explanation for the history. 2. Mild chronic small vessel disease. Electronically Signed   By: Monte Fantasia [name]   On: 03/10/2016 08:49    DISCHARGE EXAMINATION: Filed Vitals:   03/10/16 1000 03/10/16 1407 03/10/16 2109 03/11/16 0512  BP: 150/72 128/60 140/60 152/70  Pulse: 56 61 62 57  Temp: 98.3 F (36.8 C) 98 F (36.7 C) 98.7 F (37.1 C) 97.7 F (36.5 C)  TempSrc: Oral Oral Oral Oral  Resp: 15 16  18   Height: 5\' 9"  (1.753 m)     Weight: 80.695 kg (177 lb 14.4 oz)   79.289 kg (174 lb 12.8 oz)  SpO2: 98% 96% 98% 98%   General appearance: alert, cooperative, appears stated age and no distress Resp: clear to auscultation bilaterally Cardio: regular rate and rhythm, S1, S2 normal, no murmur, click, rub or gallop GI: soft, non-tender; bowel sounds normal; no masses,  no organomegaly Extremities: extremities normal, atraumatic, no cyanosis or edema  DISPOSITION: Home  Discharge Instructions    Call MD for:  difficulty breathing, headache or visual disturbances    Complete by:  As directed      Call MD for:  extreme fatigue    Complete by:  As directed      Call MD for:  persistant dizziness or light-headedness    Complete  by:  As directed      Call MD for:  persistant nausea and vomiting    Complete by:  As directed      Call MD for:  severe uncontrolled pain    Complete by:  As directed      Call MD for:  temperature >100.4    Complete by:  As directed      Diet - low sodium heart healthy    Complete by:  As directed      Diet Carb Modified    Complete by:  As directed        Discharge instructions    Complete by:  As directed   Please follow up with your PCP. Change aspirin to plavix. Seek attention if such episode recurs.  You were cared for by a hospitalist during your hospital stay. If you have any questions about your discharge medications or the care you received while you were in the hospital after you are discharged, you can call the unit and asked to speak with the hospitalist on call if the hospitalist that took care of you is not available. Once you are discharged, your primary care physician will handle any further medical issues. Please note that NO REFILLS for any discharge medications will be authorized once you are discharged, as it is imperative that you return to your primary care physician (or establish a relationship with a primary care physician if you do not have one) for your aftercare needs so that they can reassess your need for medications and monitor your lab values. If you do not have a primary care physician, you can call [phone] for a physician referral.     Increase activity slowly    Complete by:  As directed            ALLERGIES:  Allergies  Allergen Reactions  . Morphine Other (See Comments)    hallucinations  . Shellfish-Derived Products Itching     Discharge Medication List as of 03/11/2016  9:14 AM    START taking these medications   Details  baclofen (LIORESAL) 10 MG tablet Take 0.5 tablets (5 mg total) by mouth at bedtime., Starting 03/11/2016, Until Discontinued, Print    clopidogrel (PLAVIX) 75 MG tablet Take 1 tablet (75 mg total) by mouth daily., Starting 03/11/2016, Until Discontinued, Print      CONTINUE these medications which have CHANGED   Details  metoprolol tartrate (LOPRESSOR) 25 MG tablet Take 0.5 tablets (12.5 mg total) by mouth daily. PLEASE HOLD FOR 3 DAYS AND THEN RESUME ONCE A DAY., Starting 03/15/2016, Until Discontinued, No Print      CONTINUE these medications which have NOT CHANGED   Details   atorvastatin (LIPITOR) 40 MG tablet Take 1 tablet (40 mg total) by mouth daily at 6 PM., Starting 05/30/2015, Until Discontinued, Normal    Cholecalciferol (DIALYVITE VITAMIN D3 MAX) 09811 units TABS Take 1 tablet by mouth once a week., Starting 01/16/2016, Until Discontinued, Normal    clotrimazole-betamethasone (LOTRISONE) cream APPLY TO THE AFFECTED TWICE DAILY, Normal    diclofenac sodium (VOLTAREN) 1 % GEL Apply 2 g topically 3 (three) times daily as needed. For pain to affected area, Starting 05/01/2015, Until Discontinued, Normal    gabapentin (NEURONTIN) 300 MG capsule 2 capsules in AM, 1 capsule at noon, 2 capsules in PM daily, Normal    hyoscyamine (ANASPAZ) 0.125 MG TBDP disintergrating tablet Place 1 tablet (0.125 mg total) under the tongue every 4 (four) hours as needed for  cramping., Starting 01/16/2016, Until Discontinued, Normal    insulin aspart (NOVOLOG) 100 UNIT/ML injection Inject 10-17 Units into the skin 3 (three) times daily with meals. Use 10-17 unit 3 times daily before meals, Starting 02/09/2016, Until Discontinued, Normal    Insulin Glargine (TOUJEO SOLOSTAR) 300 UNIT/ML SOPN Inject 42 Units into the skin at bedtime., Starting 09/16/2015, Until Discontinued, Normal    isosorbide mononitrate (IMDUR) 120 MG 24 hr tablet Take 1 tablet (120 mg total) by mouth daily., Starting 12/19/2015, Until Discontinued, Normal    levothyroxine (SYNTHROID, LEVOTHROID) 75 MCG tablet Take 1 tablet (75 mcg total) by mouth daily., Starting 02/09/2016, Until Discontinued, Normal    metFORMIN (GLUCOPHAGE) 1000 MG tablet Take 1 tablet (1,000 mg total) by mouth 2 (two) times daily with a meal., Starting 06/12/2015, Until Discontinued, Normal    ondansetron (ZOFRAN-ODT) 4 MG disintegrating tablet Take 1 tablet (4 mg total) by mouth every 8 (eight) hours as needed for nausea or vomiting., Starting 01/16/2016, Until Discontinued, Normal    pantoprazole (PROTONIX) 40 MG tablet Take 40 mg by mouth daily.,  Until Discontinued, Historical Med    pramipexole (MIRAPEX) 0.5 MG tablet Take 1-2 tablets (0.5-1 mg total) by mouth at bedtime., Starting 12/19/2015, Until Discontinued, Normal    sertraline (ZOLOFT) 100 MG tablet Take 1 tablet (100 mg total) by mouth 2 (two) times daily., Starting 07/24/2015, Until Discontinued, Normal    tiZANidine (ZANAFLEX) 4 MG tablet Take 1 tablet (4 mg total) by mouth every 8 (eight) hours as needed for muscle spasms., Starting 11/19/2015, Until Discontinued, Normal    topiramate (TOPAMAX) 50 MG tablet Take 1 tablet (50 mg total) by mouth at bedtime., Starting 04/25/2015, Until Discontinued, Normal    glucose blood (ACCU-CHEK AVIVA) test strip Use as directed to check blood sugar 4 times daily.  DX E11.9, Normal    nitroGLYCERIN (NITROSTAT) 0.4 MG SL tablet Place 1 tablet (0.4 mg total) under the tongue every 5 (five) minutes as needed (up to 3 doses)., Starting 02/09/2016, Until Discontinued, Normal      STOP taking these medications     aspirin EC 81 MG tablet        Follow-up Information    Follow up with Penni Homans, MD. Go on 03/17/2016.   Specialty:  Family Medicine   Why:  post hospitalization follow up @1130  w/ [place] information:   [address] [place] [address] [phone]       TOTAL DISCHARGE TIME: 13 minutes  [facility] Pager [phone]  03/11/2016, 2:17 PM

## 2016-03-12 ENCOUNTER — Telehealth: Payer: Self-pay | Admitting: *Deleted

## 2016-03-12 NOTE — Telephone Encounter (Signed)
Unable to reach patient at time of TCM call. Left message for patient to return call when available.   

## 2016-03-13 ENCOUNTER — Encounter (HOSPITAL_BASED_OUTPATIENT_CLINIC_OR_DEPARTMENT_OTHER): Payer: Self-pay | Admitting: *Deleted

## 2016-03-13 ENCOUNTER — Emergency Department (HOSPITAL_BASED_OUTPATIENT_CLINIC_OR_DEPARTMENT_OTHER): Payer: Commercial Managed Care - HMO

## 2016-03-13 ENCOUNTER — Inpatient Hospital Stay (HOSPITAL_BASED_OUTPATIENT_CLINIC_OR_DEPARTMENT_OTHER)
Admission: EM | Admit: 2016-03-13 | Discharge: 2016-03-16 | DRG: 073 | Disposition: A | Discharge status: Home / Self Care | Payer: Commercial Managed Care - HMO | Attending: Internal Medicine | Admitting: Internal Medicine

## 2016-03-13 DIAGNOSIS — Z794 Long term (current) use of insulin: Secondary | ICD-10-CM | POA: Diagnosis not present

## 2016-03-13 DIAGNOSIS — I10 Essential (primary) hypertension: Secondary | ICD-10-CM | POA: Diagnosis present

## 2016-03-13 DIAGNOSIS — I951 Orthostatic hypotension: Secondary | ICD-10-CM | POA: Diagnosis present

## 2016-03-13 DIAGNOSIS — I959 Hypotension, unspecified: Secondary | ICD-10-CM | POA: Diagnosis present

## 2016-03-13 DIAGNOSIS — G629 Polyneuropathy, unspecified: Secondary | ICD-10-CM | POA: Diagnosis present

## 2016-03-13 DIAGNOSIS — I2510 Atherosclerotic heart disease of native coronary artery without angina pectoris: Secondary | ICD-10-CM | POA: Diagnosis present

## 2016-03-13 DIAGNOSIS — Z9114 Patient's other noncompliance with medication regimen: Secondary | ICD-10-CM | POA: Diagnosis not present

## 2016-03-13 DIAGNOSIS — E1143 Type 2 diabetes mellitus with diabetic autonomic (poly)neuropathy: Principal | ICD-10-CM | POA: Diagnosis present

## 2016-03-13 DIAGNOSIS — Z8673 Personal history of transient ischemic attack (TIA), and cerebral infarction without residual deficits: Secondary | ICD-10-CM

## 2016-03-13 DIAGNOSIS — E785 Hyperlipidemia, unspecified: Secondary | ICD-10-CM | POA: Diagnosis present

## 2016-03-13 DIAGNOSIS — G4733 Obstructive sleep apnea (adult) (pediatric): Secondary | ICD-10-CM | POA: Diagnosis present

## 2016-03-13 DIAGNOSIS — I4510 Unspecified right bundle-branch block: Secondary | ICD-10-CM | POA: Diagnosis present

## 2016-03-13 DIAGNOSIS — E119 Type 2 diabetes mellitus without complications: Secondary | ICD-10-CM | POA: Diagnosis present

## 2016-03-13 DIAGNOSIS — I5032 Chronic diastolic (congestive) heart failure: Secondary | ICD-10-CM | POA: Diagnosis present

## 2016-03-13 DIAGNOSIS — Z951 Presence of aortocoronary bypass graft: Secondary | ICD-10-CM | POA: Diagnosis not present

## 2016-03-13 DIAGNOSIS — R55 Syncope and collapse: Secondary | ICD-10-CM | POA: Diagnosis not present

## 2016-03-13 DIAGNOSIS — Z7902 Long term (current) use of antithrombotics/antiplatelets: Secondary | ICD-10-CM | POA: Diagnosis not present

## 2016-03-13 DIAGNOSIS — G2581 Restless legs syndrome: Secondary | ICD-10-CM | POA: Diagnosis present

## 2016-03-13 DIAGNOSIS — F319 Bipolar disorder, unspecified: Secondary | ICD-10-CM | POA: Diagnosis present

## 2016-03-13 DIAGNOSIS — I110 Hypertensive heart disease with heart failure: Secondary | ICD-10-CM | POA: Diagnosis present

## 2016-03-13 DIAGNOSIS — R4182 Altered mental status, unspecified: Secondary | ICD-10-CM | POA: Diagnosis present

## 2016-03-13 DIAGNOSIS — R29810 Facial weakness: Secondary | ICD-10-CM | POA: Diagnosis present

## 2016-03-13 DIAGNOSIS — Y95 Nosocomial condition: Secondary | ICD-10-CM | POA: Diagnosis present

## 2016-03-13 DIAGNOSIS — J189 Pneumonia, unspecified organism: Secondary | ICD-10-CM | POA: Diagnosis present

## 2016-03-13 DIAGNOSIS — R42 Dizziness and giddiness: Secondary | ICD-10-CM | POA: Diagnosis not present

## 2016-03-13 LAB — CBC WITH DIFFERENTIAL/PLATELET
BASOS PCT: 0 %
Basophils Absolute: 0 10*3/uL (ref 0.0–0.1)
EOS ABS: 0.3 10*3/uL (ref 0.0–0.7)
Eosinophils Relative: 2 %
HCT: 37.6 % — ABNORMAL LOW (ref 39.0–52.0)
HEMOGLOBIN: 13.4 g/dL (ref 13.0–17.0)
Lymphocytes Relative: 30 %
Lymphs Abs: 3.5 10*3/uL (ref 0.7–4.0)
MCH: 31.4 pg (ref 26.0–34.0)
MCHC: 35.6 g/dL (ref 30.0–36.0)
MCV: 88.1 fL (ref 78.0–100.0)
MONOS PCT: 8 %
Monocytes Absolute: 0.9 10*3/uL (ref 0.1–1.0)
NEUTROS PCT: 60 %
Neutro Abs: 6.7 10*3/uL (ref 1.7–7.7)
Platelets: 252 10*3/uL (ref 150–400)
RBC: 4.27 MIL/uL (ref 4.22–5.81)
RDW: 13.2 % (ref 11.5–15.5)
WBC: 11.4 10*3/uL — AB (ref 4.0–10.5)

## 2016-03-13 LAB — COMPREHENSIVE METABOLIC PANEL
ALK PHOS: 65 U/L (ref 38–126)
ALT: 28 U/L (ref 17–63)
AST: 27 U/L (ref 15–41)
Albumin: 4.3 g/dL (ref 3.5–5.0)
Anion gap: 7 (ref 5–15)
BILIRUBIN TOTAL: 0.9 mg/dL (ref 0.3–1.2)
BUN: 17 mg/dL (ref 6–20)
CALCIUM: 9.4 mg/dL (ref 8.9–10.3)
CO2: 21 mmol/L — AB (ref 22–32)
CREATININE: 1.56 mg/dL — AB (ref 0.61–1.24)
Chloride: 108 mmol/L (ref 101–111)
GFR, EST AFRICAN AMERICAN: 51 mL/min — AB (ref >60)
GFR, EST NON AFRICAN AMERICAN: 44 mL/min — AB (ref >60)
Glucose, Bld: 97 mg/dL (ref 65–99)
Potassium: 3.9 mmol/L (ref 3.5–5.1)
Sodium: 136 mmol/L (ref 135–145)
TOTAL PROTEIN: 7 g/dL (ref 6.5–8.1)

## 2016-03-13 LAB — URINALYSIS, ROUTINE W REFLEX MICROSCOPIC
BILIRUBIN URINE: NEGATIVE
Glucose, UA: 100 mg/dL — AB
Hgb urine dipstick: NEGATIVE
KETONES UR: NEGATIVE mg/dL
Leukocytes, UA: NEGATIVE
NITRITE: NEGATIVE
Protein, ur: NEGATIVE mg/dL
SPECIFIC GRAVITY, URINE: 1.011 (ref 1.005–1.030)
pH: 5 (ref 5.0–8.0)

## 2016-03-13 LAB — STREP PNEUMONIAE URINARY ANTIGEN: STREP PNEUMO URINARY ANTIGEN: NEGATIVE

## 2016-03-13 LAB — MRSA PCR SCREENING: MRSA BY PCR: NEGATIVE

## 2016-03-13 LAB — INFLUENZA PANEL BY PCR (TYPE A & B)
H1N1FLUPCR: NOT DETECTED
INFLAPCR: NEGATIVE
INFLBPCR: NEGATIVE

## 2016-03-13 LAB — LIPASE, BLOOD: Lipase: 11 U/L (ref 11–51)

## 2016-03-13 LAB — BRAIN NATRIURETIC PEPTIDE: B Natriuretic Peptide: 24.8 pg/mL (ref 0.0–100.0)

## 2016-03-13 LAB — I-STAT CG4 LACTIC ACID, ED: LACTIC ACID, VENOUS: 2.09 mmol/L — AB (ref 0.5–2.0)

## 2016-03-13 LAB — CBG MONITORING, ED: Glucose-Capillary: 84 mg/dL (ref 65–99)

## 2016-03-13 LAB — TROPONIN I

## 2016-03-13 LAB — GLUCOSE, CAPILLARY: Glucose-Capillary: 126 mg/dL — ABNORMAL HIGH (ref 65–99)

## 2016-03-13 MED ORDER — INSULIN ASPART 100 UNIT/ML ~~LOC~~ SOLN
0.0000 [IU] | Freq: Three times a day (TID) | SUBCUTANEOUS | Status: DC
  Administered 2016-03-14: 2 [IU] via SUBCUTANEOUS
  Administered 2016-03-14: 3 [IU] via SUBCUTANEOUS
  Administered 2016-03-14: 2 [IU] via SUBCUTANEOUS
  Administered 2016-03-15: 8 [IU] via SUBCUTANEOUS
  Administered 2016-03-15 (×2): 3 [IU] via SUBCUTANEOUS
  Administered 2016-03-16: 5 [IU] via SUBCUTANEOUS
  Administered 2016-03-16: 8 [IU] via SUBCUTANEOUS
  Administered 2016-03-16: 5 [IU] via SUBCUTANEOUS

## 2016-03-13 MED ORDER — CLOPIDOGREL BISULFATE 75 MG PO TABS
75.0000 mg | ORAL_TABLET | Freq: Every day | ORAL | Status: DC
  Administered 2016-03-14 – 2016-03-16 (×3): 75 mg via ORAL
  Filled 2016-03-13 (×3): qty 1

## 2016-03-13 MED ORDER — INSULIN ASPART 100 UNIT/ML ~~LOC~~ SOLN
4.0000 [IU] | Freq: Three times a day (TID) | SUBCUTANEOUS | Status: DC
  Administered 2016-03-14 – 2016-03-16 (×9): 4 [IU] via SUBCUTANEOUS

## 2016-03-13 MED ORDER — GABAPENTIN 300 MG PO CAPS
300.0000 mg | ORAL_CAPSULE | Freq: Every day | ORAL | Status: DC
  Administered 2016-03-14 – 2016-03-15 (×2): 300 mg via ORAL
  Filled 2016-03-13 (×2): qty 1

## 2016-03-13 MED ORDER — VANCOMYCIN HCL 10 G IV SOLR
1250.0000 mg | INTRAVENOUS | Status: DC
  Administered 2016-03-14: 1250 mg via INTRAVENOUS
  Filled 2016-03-13: qty 1250

## 2016-03-13 MED ORDER — TOPIRAMATE 25 MG PO TABS
50.0000 mg | ORAL_TABLET | Freq: Every day | ORAL | Status: DC
  Administered 2016-03-13 – 2016-03-15 (×3): 50 mg via ORAL
  Filled 2016-03-13 (×4): qty 2

## 2016-03-13 MED ORDER — PRAMIPEXOLE DIHYDROCHLORIDE 1 MG PO TABS
1.0000 mg | ORAL_TABLET | Freq: Every day | ORAL | Status: DC
  Administered 2016-03-13 – 2016-03-15 (×3): 1 mg via ORAL
  Filled 2016-03-13: qty 4
  Filled 2016-03-13 (×3): qty 1

## 2016-03-13 MED ORDER — ONDANSETRON 4 MG PO TBDP
4.0000 mg | ORAL_TABLET | Freq: Three times a day (TID) | ORAL | Status: DC | PRN
  Filled 2016-03-13: qty 1

## 2016-03-13 MED ORDER — CLOTRIMAZOLE-BETAMETHASONE 1-0.05 % EX CREA
1.0000 | TOPICAL_CREAM | Freq: Two times a day (BID) | CUTANEOUS | Status: DC
Start: 2016-03-13 — End: 2016-03-16
  Administered 2016-03-13 – 2016-03-16 (×6): 1 via TOPICAL
  Filled 2016-03-13: qty 15

## 2016-03-13 MED ORDER — LEVOTHYROXINE SODIUM 75 MCG PO TABS
75.0000 ug | ORAL_TABLET | Freq: Every day | ORAL | Status: DC
  Administered 2016-03-14 – 2016-03-16 (×3): 75 ug via ORAL
  Filled 2016-03-13 (×3): qty 1

## 2016-03-13 MED ORDER — DEXTROSE 5 % IV SOLN
2.0000 g | INTRAVENOUS | Status: DC
  Administered 2016-03-13: 2 g via INTRAVENOUS
  Filled 2016-03-13 (×2): qty 2

## 2016-03-13 MED ORDER — INSULIN GLARGINE 100 UNIT/ML ~~LOC~~ SOLN
30.0000 [IU] | Freq: Every day | SUBCUTANEOUS | Status: DC
  Administered 2016-03-14 – 2016-03-15 (×3): 30 [IU] via SUBCUTANEOUS
  Filled 2016-03-13 (×4): qty 0.3

## 2016-03-13 MED ORDER — PANTOPRAZOLE SODIUM 40 MG PO TBEC
40.0000 mg | DELAYED_RELEASE_TABLET | Freq: Every day | ORAL | Status: DC
  Administered 2016-03-14 – 2016-03-16 (×3): 40 mg via ORAL
  Filled 2016-03-13 (×3): qty 1

## 2016-03-13 MED ORDER — BACLOFEN 5 MG HALF TABLET
5.0000 mg | ORAL_TABLET | Freq: Every day | ORAL | Status: DC
  Administered 2016-03-13 – 2016-03-15 (×3): 5 mg via ORAL
  Filled 2016-03-13 (×4): qty 1

## 2016-03-13 MED ORDER — ATORVASTATIN CALCIUM 40 MG PO TABS
40.0000 mg | ORAL_TABLET | Freq: Every day | ORAL | Status: DC
  Administered 2016-03-14 – 2016-03-16 (×3): 40 mg via ORAL
  Filled 2016-03-13 (×4): qty 1

## 2016-03-13 MED ORDER — PIPERACILLIN-TAZOBACTAM 3.375 G IVPB 30 MIN
3.3750 g | Freq: Once | INTRAVENOUS | Status: AC
  Administered 2016-03-13: 3.375 g via INTRAVENOUS
  Filled 2016-03-13 (×2): qty 50

## 2016-03-13 MED ORDER — ACETAMINOPHEN 325 MG PO TABS
650.0000 mg | ORAL_TABLET | Freq: Once | ORAL | Status: AC
Start: 2016-03-13 — End: 2016-03-13
  Administered 2016-03-13: 650 mg via ORAL
  Filled 2016-03-13: qty 2

## 2016-03-13 MED ORDER — DEXTROSE 5 % IV SOLN
2.0000 g | INTRAVENOUS | Status: DC
  Filled 2016-03-13: qty 2

## 2016-03-13 MED ORDER — GABAPENTIN 300 MG PO CAPS
600.0000 mg | ORAL_CAPSULE | Freq: Two times a day (BID) | ORAL | Status: DC
  Administered 2016-03-14 – 2016-03-16 (×7): 600 mg via ORAL
  Filled 2016-03-13 (×6): qty 2

## 2016-03-13 MED ORDER — SERTRALINE HCL 100 MG PO TABS
100.0000 mg | ORAL_TABLET | Freq: Two times a day (BID) | ORAL | Status: DC
  Administered 2016-03-13 – 2016-03-16 (×6): 100 mg via ORAL
  Filled 2016-03-13 (×6): qty 1

## 2016-03-13 MED ORDER — VANCOMYCIN HCL IN DEXTROSE 1-5 GM/200ML-% IV SOLN
1000.0000 mg | Freq: Once | INTRAVENOUS | Status: AC
  Administered 2016-03-13: 1000 mg via INTRAVENOUS
  Filled 2016-03-13: qty 200

## 2016-03-13 MED ORDER — ISOSORBIDE MONONITRATE ER 30 MG PO TB24: 120.0000 mg | ORAL_TABLET | Freq: Every day | ORAL | Status: DC

## 2016-03-13 MED ORDER — GABAPENTIN 300 MG PO CAPS: 300.0000 mg | ORAL_CAPSULE | ORAL | Status: DC

## 2016-03-13 MED ORDER — SODIUM CHLORIDE 0.9 % IV SOLN: 1250.0000 mg | INTRAVENOUS | Status: DC

## 2016-03-13 MED ORDER — HEPARIN SODIUM (PORCINE) 5000 UNIT/ML IJ SOLN
5000.0000 [IU] | Freq: Three times a day (TID) | INTRAMUSCULAR | Status: DC
Start: 2016-03-13 — End: 2016-03-16
  Administered 2016-03-13 – 2016-03-16 (×8): 5000 [IU] via SUBCUTANEOUS
  Filled 2016-03-13 (×8): qty 1

## 2016-03-13 NOTE — ED Notes (Signed)
Patient arrived hypotensive, vomiting and altered loc. Wife says started suddenly in car on way to breakfast. Took insulin this am prior to leaving. Was discharged from [facility] with diagnosis of TIA. Patient will open eyes to verbal command. Alert and oriented. Denies pain other than mild headache

## 2016-03-13 NOTE — ED Notes (Signed)
Placed on 2lpm via [place] secondary to NBP

## 2016-03-13 NOTE — Progress Notes (Signed)
Pharmacy Antibiotic Note  [name] [name] is a 70 y.o. male admitted on 03/13/2016 with pneumonia.  Pharmacy has been consulted for Cefepime and Vancomycin dosing.  Plan: Cefepime 2 grams iv Q 24 hours Vancomycin 1250 mg iv Q 24 hours  Height: 5\' 9"  (175.3 cm) Weight: 181 lb 10.5 oz (82.4 kg) IBW/kg (Calculated) : 70.7  Temp (24hrs), Avg:97.6 F (36.4 C), Min:97.4 F (36.3 C), Max:98 F (36.7 C)   Recent Labs Lab 03/08/16 1728 03/09/16 2036 03/10/16 0550 03/11/16 0344 03/13/16 1155 03/13/16 1204  WBC 6.6 8.0 6.1 6.6 11.4*  --   CREATININE 1.18 1.25* 1.05 0.99 1.56*  --   LATICACIDVEN  --   --   --   --   --  2.09*    Estimated Creatinine Clearance: 45.9 mL/min (by C-G formula based on Cr of 1.56).    Allergies  Allergen Reactions  . Morphine Other (See Comments)    hallucinations  . Shellfish-Derived Products Itching    Antimicrobials this admission: Vancomycin 4/8 >>  Cefepime 4/8 >>   Microbiology results: pending  Thank you. Anette Guarneri, PharmD [phone]  03/13/2016 7:15 PM

## 2016-03-13 NOTE — ED Notes (Signed)
HIGH FALL RISK INTERVENTIONS ALSO IMPLEMENTED

## 2016-03-13 NOTE — ED Notes (Signed)
EDP informed of nsg actions immediately implemented, informed of facial/ smile irregularity

## 2016-03-13 NOTE — ED Notes (Signed)
OXYGEN VIA 2LPM [place] REMOVED PER EDP ORDERS

## 2016-03-13 NOTE — ED Notes (Signed)
Phone Hand Off Report called to rec RN at [facility] for questions provided

## 2016-03-13 NOTE — ED Provider Notes (Addendum)
CSN: [id number]     Arrival date & time 03/13/16  1140 History   First MD Initiated Contact with Patient 03/13/16 1158     Chief Complaint  Patient presents with  . Altered Mental Status     (Consider location/radiation/quality/duration/timing/severity/associated sxs/prior Treatment) HPI Comments: Patient history diabetes, hypertension, coronary artery disease status post bypass surgery and obstructive sleep apnea presents with nausea and vomiting. His wife states he's been having spells where he gets lightheaded and fatigued and becomes altered. He had 2 of these spells last week. On the second spell he was admitted to the hospital for a near syncopal episode. He had a fairly extensive evaluation including CT of his head and neck. He had MRI of his brain. He had an echocardiogram and an EEG. They were not able to find a specific cause for the patient's symptoms. It was felt that this might represent TIA. He was discharged home 2 days ago. Patient states that he's had ongoing fatigue since discharge from the hospital. This morning he felt lightheaded on ambulation and his wife states he was staggering a little bit on ambulation. They were meeting their daughter for breakfast and on the way he had to stop and had 2 episodes of vomiting. He remained nauseated. He was brought to the emergency room where he was found to be hypotensive with a BP in the 70s. He still feels very fatigued. He denies any chest pain. He has had some intermittent shortness of breath. He denies any significant headache. No fevers. No URI symptoms. No coughing. No difficulty with urination. He denies any speech deficits although his wife states that his speech has been sloughed. He denies any vision changes. He denies any numbness or weakness to his extremities. His wife noted on his prior visits he did have some weakness in his left side as compared to his right.  Patient is a 70 y.o. male presenting with altered mental status.   Altered Mental Status Associated symptoms: light-headedness, nausea and vomiting   Associated symptoms: no abdominal pain, no fever, no headaches, no rash and no weakness     Past Medical History  Diagnosis Date  . Diabetes mellitus type II, uncontrolled ([place])   . OSA (obstructive sleep apnea)   . Hyperlipidemia   . RBBB (right bundle branch block)   . Depression     Bipolar  . Edema   . Hypertension   . Falling episodes     these have occurred in the past and again recurring 2011  . Spondylosis     C5-6, C6-7 MRI 2010  . CAD (coronary artery disease)     A. CABG in 2000,status post cardiac cath in 2006, 2009 ....continued chest pain and SOB despite oral medication adjestments including Ranexa. B. Cath November 2009/ mRCA - 2.75 x 23 Abbott Xience V drug-eluting stent ...11/26/2008 to distal  RCA leading to acute marginal.  C. Cath 07/2012 for CP - stable anatomy, med rx. d. cath 2015 and 05/30/2015 stable anatomy, consider Myoview if has CP again  . Hx of CABG     2000,  / one median sternotomy suture broken her chest x-ray November, 2010, no clinical significance  . Palpitations     event recorder showed sinus rhythm  . Nephrolithiasis   . Restless leg   . Dizziness   . Shortness of breath     CPX April, 2011, mild functional limitation, no clear pulmonary or cardiac limitation, possible deconditioning and mild chronotropic incompetence( peak heart  rate 130)  . Cerebral ischemia     MRI November, 2010, chronic microvascular ischemia  . Anemia     hemoglobin 7.4, iron deficiency, January, 2011, 2 unit transfusion, endoscopy normal, capsule endoscopy February, 2011 no small bowel abnormalities.   Most likely source gastric erosions, followed by GI  . H/O medication noncompliance     Due to loss of insurance  . Ejection fraction     EF 60%, echo, July 31, 2012  . Diabetes mellitus without complication ([place])   . Pain in limb 06/12/2009    Qualifier: Diagnosis of  By: [name]   . Low back pain 06/12/2009    Qualifier: Diagnosis of  By: [name]   . Thyroid disease   . Right knee pain 01/07/2015  . Advance care planning 05/01/2015  . ACP (advance care planning) 05/11/2015  . Nausea with vomiting 01/25/2016  . Headache 01/25/2016  . Diarrhea 02/15/2016  . Gastric ulcer   . Tubulovillous adenoma of colon 2007  . Iron deficiency anemia   . Syncope 03/2016   Past Surgical History  Procedure Laterality Date  . Nasal septum surgery      UP3  . Coronary artery bypass graft      2000  . Left heart catheterization with coronary/graft angiogram N/A 08/01/2012    Procedure: LEFT HEART CATHETERIZATION WITH Beatrix Fetters;  Surgeon: [name], MD;  Location: [facility] CATH LAB;  Service: Cardiovascular;  Laterality: N/A;  . Left heart catheterization with coronary/graft angiogram N/A 01/03/2015    Procedure: LEFT HEART CATHETERIZATION WITH Beatrix Fetters;  Surgeon: [name], MD;  Location: [facility] CATH LAB;  Service: Cardiovascular;  Laterality: N/A;  . Percutaneous coronary stent intervention (pci-s)  10/2008    mRCA PCI  2.75 x 23 Abbott Xience V drug-eluting stent   . Cardiac catheterization N/A 05/30/2015    Procedure: Left Heart Cath and Coronary Angiography;  Surgeon: [name], MD;  Location: [facility] CV LAB;  Service: Cardiovascular;  Laterality: N/A;   Family History  Problem Relation Age of Onset  . Cancer Brother     pancreatic cancer  . Diabetes Brother     4   . Stroke Brother   . Diabetes Brother   . Cancer Brother   . Diabetes Brother   . Coronary artery disease Brother   . Diabetes Brother   . Hypothyroidism Brother   . Heart attack      Nephew  . Diabetes Mother   . Heart failure Mother   . Heart failure Father   . Irregular heart beat Daughter   . Cancer Maternal Grandmother     unknown    Social History  Substance Use Topics  . Smoking status: Never Smoker   . Smokeless tobacco: Never Used  .  Alcohol Use: No     Comment: stopped drinking in 1998    Review of Systems  Constitutional: Positive for fatigue. Negative for fever, chills and diaphoresis.  HENT: Negative for congestion, rhinorrhea and sneezing.   Eyes: Negative.   Respiratory: Negative for cough, chest tightness and shortness of breath.   Cardiovascular: Negative for chest pain and leg swelling.  Gastrointestinal: Positive for nausea and vomiting. Negative for abdominal pain, diarrhea and blood in stool.  Genitourinary: Negative for frequency, hematuria, flank pain and difficulty urinating.  Musculoskeletal: Negative for back pain and arthralgias.  Skin: Negative for rash.  Neurological: Positive for light-headedness. Negative for dizziness, speech difficulty,  weakness, numbness and headaches.      Allergies  Morphine and Shellfish-derived products  Home Medications   Prior to Admission medications   Medication Sig Start Date End Date Taking? Authorizing Provider  atorvastatin (LIPITOR) 40 MG tablet Take 1 tablet (40 mg total) by mouth daily at 6 PM. 05/30/15   Almyra Deforest, PA  baclofen (LIORESAL) 10 MG tablet Take 0.5 tablets (5 mg total) by mouth at bedtime. 03/11/16   Bonnielee Haff, MD  Cholecalciferol (DIALYVITE VITAMIN D3 MAX) 60454 units TABS Take 1 tablet by mouth once a week. 01/16/16   [name], MD  clopidogrel (PLAVIX) 75 MG tablet Take 1 tablet (75 mg total) by mouth daily. 03/11/16   Bonnielee Haff, MD  clotrimazole-betamethasone (LOTRISONE) cream APPLY TO THE AFFECTED TWICE DAILY Patient taking differently: Apply 1 application topically 2 (two) times daily. APPLY TO THE AFFECTED TWICE DAILY 02/09/16   [name], MD  diclofenac sodium (VOLTAREN) 1 % GEL Apply 2 g topically 3 (three) times daily as needed. For pain to affected area 05/01/15   [name], MD  gabapentin (NEURONTIN) 300 MG capsule 2 capsules in AM, 1 capsule at noon, 2 capsules in PM daily Patient taking differently: Take 300 mg by  mouth See admin instructions. 2 capsules in AM, 1 capsule at noon, 2 capsules in PM daily 02/09/16   [name], MD  glucose blood (ACCU-CHEK AVIVA) test strip Use as directed to check blood sugar 4 times daily.  DX E11.9 02/09/16   [name], MD  hyoscyamine (ANASPAZ) 0.125 MG TBDP disintergrating tablet Place 1 tablet (0.125 mg total) under the tongue every 4 (four) hours as needed for cramping. 01/16/16   [name], MD  insulin aspart (NOVOLOG) 100 UNIT/ML injection Inject 10-17 Units into the skin 3 (three) times daily with meals. Use 10-17 unit 3 times daily before meals Patient taking differently: Inject 10-17 Units into the skin 3 (three) times daily with meals. Use 10-17 unit 3 times daily before meals per sliding scale 02/09/16   [name], MD  Insulin Glargine (TOUJEO SOLOSTAR) 300 UNIT/ML SOPN Inject 42 Units into the skin at bedtime. 09/16/15   [name], MD  isosorbide mononitrate (IMDUR) 120 MG 24 hr tablet Take 1 tablet (120 mg total) by mouth daily. 12/19/15   [name], MD  levothyroxine (SYNTHROID, LEVOTHROID) 75 MCG tablet Take 1 tablet (75 mcg total) by mouth daily. 02/09/16   [name], MD  metFORMIN (GLUCOPHAGE) 1000 MG tablet Take 1 tablet (1,000 mg total) by mouth 2 (two) times daily with a meal. 06/12/15   [name], MD  metoprolol tartrate (LOPRESSOR) 25 MG tablet Take 0.5 tablets (12.5 mg total) by mouth daily. PLEASE HOLD FOR 3 DAYS AND THEN RESUME ONCE A DAY. 03/15/16   Bonnielee Haff, MD  nitroGLYCERIN (NITROSTAT) 0.4 MG SL tablet Place 1 tablet (0.4 mg total) under the tongue every 5 (five) minutes as needed (up to 3 doses). Patient taking differently: Place 0.4 mg under the tongue every 5 (five) minutes as needed for chest pain (up to 3 doses).  02/09/16   [name], MD  ondansetron (ZOFRAN-ODT) 4 MG disintegrating tablet Take 1 tablet (4 mg total) by mouth every 8 (eight) hours as needed for nausea or vomiting. 01/16/16   [name], MD   pantoprazole (PROTONIX) 40 MG tablet Take 40 mg by mouth daily.    Historical Provider, MD  pramipexole (  MIRAPEX) 0.5 MG tablet Take 1-2 tablets (0.5-1 mg total) by mouth at bedtime. 12/19/15   [name], MD  sertraline (ZOLOFT) 100 MG tablet Take 1 tablet (100 mg total) by mouth 2 (two) times daily. 07/24/15   [name], MD  tiZANidine (ZANAFLEX) 4 MG tablet Take 1 tablet (4 mg total) by mouth every 8 (eight) hours as needed for muscle spasms. 11/19/15   Pieter Partridge, DO  topiramate (TOPAMAX) 50 MG tablet Take 1 tablet (50 mg total) by mouth at bedtime. 04/25/15   Pieter Partridge, DO   BP 105/58 mmHg  Pulse 61  Temp(Src) 98 F (36.7 C) (Rectal)  Resp 15  SpO2 99% Physical Exam  Constitutional: He is oriented to person, place, and time. He appears well-developed and well-nourished.  Patient appears pale  HENT:  Head: Normocephalic and atraumatic.  Eyes: Pupils are equal, round, and reactive to light.  Neck: Normal range of motion. Neck supple.  Cardiovascular: Normal rate, regular rhythm and normal heart sounds.   Pulmonary/Chest: Effort normal and breath sounds normal. No respiratory distress. He has no wheezes. He has no rales. He exhibits no tenderness.  Abdominal: Soft. Bowel sounds are normal. There is no tenderness. There is no rebound and no guarding.  Musculoskeletal: Normal range of motion. He exhibits no edema.  Lymphadenopathy:    He has no cervical adenopathy.  Neurological: He is alert and oriented to person, place, and time.  There is some slight drooping of the corner of the right mouth as compared to the left. He has normal sensation in all distributions of the face. Visual fields are full to confrontation. Extraocular eye movements are intact. He has normal tongue protrusion. Normal shoulder shrug. Motor is 5 out of 5 in upper extremities bilaterally. 4 out of 5 in both lower extremities although he does seem to have some weakness of his left lower leg as compared to  his right. He is able to hold it up against gravity for 10 seconds but it's not as strong as on the right side. He has normal sensation to light touch in all extremities. Scissor intact and symmetric.  Skin: Skin is warm and dry. No rash noted.  Psychiatric: He has a normal mood and affect.    ED Course  Procedures (including critical care time) Labs Review Results for orders placed or performed during the hospital encounter of 03/13/16  Comprehensive metabolic panel  Result Value Ref Range   Sodium 136 135 - 145 mmol/L   Potassium 3.9 3.5 - 5.1 mmol/L   Chloride 108 101 - 111 mmol/L   CO2 21 (L) 22 - 32 mmol/L   Glucose, Bld 97 65 - 99 mg/dL   BUN 17 6 - 20 mg/dL   Creatinine, Ser 1.56 (H) 0.61 - 1.24 mg/dL   Calcium 9.4 8.9 - 10.3 mg/dL   Total Protein 7.0 6.5 - 8.1 g/dL   Albumin 4.3 3.5 - 5.0 g/dL   AST 27 15 - 41 U/L   ALT 28 17 - 63 U/L   Alkaline Phosphatase 65 38 - 126 U/L   Total Bilirubin 0.9 0.3 - 1.2 mg/dL   GFR calc non Af Amer 44 (L) >60 mL/min   GFR calc Af Amer 51 (L) >60 mL/min   Anion gap 7 5 - 15  CBC with Differential  Result Value Ref Range   WBC 11.4 (H) 4.0 - 10.5 K/uL   RBC 4.27 4.22 - 5.81 MIL/uL   Hemoglobin  13.4 13.0 - 17.0 g/dL   HCT 37.6 (L) 39.0 - 52.0 %   MCV 88.1 78.0 - 100.0 fL   MCH 31.4 26.0 - 34.0 pg   MCHC 35.6 30.0 - 36.0 g/dL   RDW 13.2 11.5 - 15.5 %   Platelets 252 150 - 400 K/uL   Neutrophils Relative % 60 %   Neutro Abs 6.7 1.7 - 7.7 K/uL   Lymphocytes Relative 30 %   Lymphs Abs 3.5 0.7 - 4.0 K/uL   Monocytes Relative 8 %   Monocytes Absolute 0.9 0.1 - 1.0 K/uL   Eosinophils Relative 2 %   Eosinophils Absolute 0.3 0.0 - 0.7 K/uL   Basophils Relative 0 %   Basophils Absolute 0.0 0.0 - 0.1 K/uL  Troponin I  Result Value Ref Range   Troponin I <0.03 <0.031 ng/mL  Lipase, blood  Result Value Ref Range   Lipase 11 11 - 51 U/L  Brain natriuretic peptide  Result Value Ref Range   B Natriuretic Peptide 24.8 0.0 - 100.0 pg/mL   CBG monitoring, ED  Result Value Ref Range   Glucose-Capillary 84 65 - 99 mg/dL  I-Stat CG4 Lactic Acid, ED  Result Value Ref Range   Lactic Acid, Venous 2.09 (HH) 0.5 - 2.0 mmol/L   Comment NOTIFIED PHYSICIAN    Ct Angio Head W/cm &/or Wo Cm  03/10/2016  CLINICAL DATA:  Presyncopal episode. Confusion versus seizure. The patient develops slurred speech during these episodes. EXAM: CT ANGIOGRAPHY HEAD AND NECK TECHNIQUE: Multidetector CT imaging of the head and neck was performed using the standard protocol during bolus administration of intravenous contrast. Multiplanar CT image reconstructions and MIPs were obtained to evaluate the vascular anatomy. Carotid stenosis measurements (when applicable) are obtained utilizing NASCET criteria, using the distal internal carotid diameter as the denominator. CONTRAST:  50 mL Isovue 370 COMPARISON:  MRI brain 01/15/2016. FINDINGS: CT HEAD Brain: Mild periventricular and subcortical white matter changes are stable. No acute cortical infarct, hemorrhage, or mass lesion is present. The ventricles are of normal size. The basal ganglia are intact. The insular ribbon is normal bilaterally. Calvarium and skull base: Within normal limits. Paranasal sinuses: Clear Orbits: Unremarkable CTA NECK Aortic arch: A 3 vessel arch configuration is present. Mural thickening is present at the aortic arch without focal stenosis or aneurysm. Right carotid system: The right common carotid artery is within normal limits. There is some atherosclerotic irregularity at the bifurcation without a significant stenosis. The cervical right ICA is normal. Left carotid system: Wall thickening is evident along the anteromedial border of the distal left common carotid artery. Mild atherosclerotic changes are noted at the carotid bifurcation. The cervical left ICA is otherwise normal. Vertebral arteries:The vertebral arteries both originate from the subclavian arteries. Right vertebral artery is the  dominant vessel. No focal stenosis or vascular injury is evident in either vertebral artery within the neck. Skeleton: Endplate degenerative changes and uncovertebral spurring are most evident at C3-4 and C6-7. There is a osseous foraminal narrowing at both of these levels. The bone windows are otherwise unremarkable. There is slight anterior translation of the right mandibular condyle compared to the left. No focal lytic or blastic lesions are present. Other neck: No focal mucosal or submucosal lesions are present. The vocal cords are midline and symmetric. The tongue base is unremarkable. No significant cervical adenopathy is present. CTA HEAD Anterior circulation: Mild atherosclerotic irregularity in calcifications are present within the cavernous internal carotid arteries bilaterally. The ICA termini are  intact. The right A1 segment is dominant. The A1 and M1 segments are intact. The MCA bifurcations are within normal limits bilaterally. There is some attenuation of distal MCA and ACA branch vessels bilaterally. No focal branch vessel occlusion is evident. The anterior communicating artery is patent. Posterior circulation: Atherosclerotic calcifications are evident at the dural margin of both vertebral arteries. There is moderate stenosis on the left. PICA origins are visualized and normal. The vertebrobasilar junction is intact. The basilar artery is normal. The right vertebral artery originates from the basilar tip. The left vertebral artery is predominantly from the posterior communicating artery with contribution from a left P1 segment. There is some attenuation of distal PCA branch vessels. Venous sinuses: The dural sinuses are patent. The right transverse sinus is dominant. The straight sinus and deep cerebral veins are intact. The cortical veins are intact. Anatomic variants: Fetal type left posterior cerebral artery with contribution from the P1 segment. Delayed phase: The postcontrast images demonstrate  no pathologic enhancement. IMPRESSION: 1. Atherosclerotic changes with predominantly noncalcified plaque at the aortic arch, the distal left common carotid artery, both carotid bifurcations, and the cavernous internal carotid arteries. There are no significant focal stenoses. 2. Mild distal small vessel intracranial atherosclerotic changes in both the anterior and posterior circulation. 3. No significant proximal stenosis, aneurysm, or branch vessel occlusion within the circle of Willis. 4. Endplate degenerative changes and uncovertebral spurring at C3-4 and C6-7. Electronically Signed   By: San Morelle [name]   On: 03/10/2016 08:00   Dg Chest 1 View  03/13/2016  CLINICAL DATA:  70 year old male with a history of hypotension and vomiting EXAM: CHEST 1 VIEW COMPARISON:  11/25/2015, 09/15/2015 FINDINGS: Cardiomediastinal silhouette unchanged in size and contour. Surgical changes of median sternotomy and CABG. Compared to prior there is interlobular septal thickening and interstitial opacity. No pneumothorax. No confluent airspace disease. No displaced fracture. IMPRESSION: Evidence of early edema, less likely atypical infection. Surgical changes of median sternotomy and CABG. Signed, Dulcy Fanny. Earleen Newport, DO [facility] Electronically Signed   By: Corrie Mckusick D.O.   On: 03/13/2016 13:06   Ct Head Wo Contrast  03/13/2016  CLINICAL DATA:  Sudden onset of dizziness and vomiting this morning. Recent TIA. Weakness. EXAM: CT HEAD WITHOUT CONTRAST TECHNIQUE: Contiguous axial images were obtained from the base of the skull through the vertex without intravenous contrast. COMPARISON:  Brain MRI, 03/10/2016.  Head CT, 03/28/2015. FINDINGS: The ventricles are normal in configuration. There is ventricular and sulcal enlargement reflecting age related volume loss. No hydrocephalus. There are no parenchymal masses or mass effect. There is no evidence of a cortical  infarct. There are no extra-axial masses or abnormal fluid collections. There is no intracranial hemorrhage. Visualized sinuses and mastoid air cells are clear. No skull lesion. IMPRESSION: 1. No acute intracranial abnormalities. 2. Age related volume loss. Electronically Signed   By: Lajean Manes [name]   On: 03/13/2016 13:10   Ct Angio Neck W/cm &/or Wo/cm  03/10/2016  CLINICAL DATA:  Presyncopal episode. Confusion versus seizure. The patient develops slurred speech during these episodes. EXAM: CT ANGIOGRAPHY HEAD AND NECK TECHNIQUE: Multidetector CT imaging of the head and neck was performed using the standard protocol during bolus administration of intravenous contrast. Multiplanar CT image reconstructions and MIPs were obtained to evaluate the vascular anatomy. Carotid stenosis measurements (when applicable) are obtained utilizing NASCET criteria, using the distal internal carotid diameter as the denominator. CONTRAST:  50 mL Isovue 370 COMPARISON:  MRI brain 01/15/2016. FINDINGS: CT HEAD Brain: Mild periventricular and subcortical white matter changes are stable. No acute cortical infarct, hemorrhage, or mass lesion is present. The ventricles are of normal size. The basal ganglia are intact. The insular ribbon is normal bilaterally. Calvarium and skull base: Within normal limits. Paranasal sinuses: Clear Orbits: Unremarkable CTA NECK Aortic arch: A 3 vessel arch configuration is present. Mural thickening is present at the aortic arch without focal stenosis or aneurysm. Right carotid system: The right common carotid artery is within normal limits. There is some atherosclerotic irregularity at the bifurcation without a significant stenosis. The cervical right ICA is normal. Left carotid system: Wall thickening is evident along the anteromedial border of the distal left common carotid artery. Mild atherosclerotic changes are noted at the carotid bifurcation. The cervical left ICA is otherwise normal. Vertebral  arteries:The vertebral arteries both originate from the subclavian arteries. Right vertebral artery is the dominant vessel. No focal stenosis or vascular injury is evident in either vertebral artery within the neck. Skeleton: Endplate degenerative changes and uncovertebral spurring are most evident at C3-4 and C6-7. There is a osseous foraminal narrowing at both of these levels. The bone windows are otherwise unremarkable. There is slight anterior translation of the right mandibular condyle compared to the left. No focal lytic or blastic lesions are present. Other neck: No focal mucosal or submucosal lesions are present. The vocal cords are midline and symmetric. The tongue base is unremarkable. No significant cervical adenopathy is present. CTA HEAD Anterior circulation: Mild atherosclerotic irregularity in calcifications are present within the cavernous internal carotid arteries bilaterally. The ICA termini are intact. The right A1 segment is dominant. The A1 and M1 segments are intact. The MCA bifurcations are within normal limits bilaterally. There is some attenuation of distal MCA and ACA branch vessels bilaterally. No focal branch vessel occlusion is evident. The anterior communicating artery is patent. Posterior circulation: Atherosclerotic calcifications are evident at the dural margin of both vertebral arteries. There is moderate stenosis on the left. PICA origins are visualized and normal. The vertebrobasilar junction is intact. The basilar artery is normal. The right vertebral artery originates from the basilar tip. The left vertebral artery is predominantly from the posterior communicating artery with contribution from a left P1 segment. There is some attenuation of distal PCA branch vessels. Venous sinuses: The dural sinuses are patent. The right transverse sinus is dominant. The straight sinus and deep cerebral veins are intact. The cortical veins are intact. Anatomic variants: Fetal type left  posterior cerebral artery with contribution from the P1 segment. Delayed phase: The postcontrast images demonstrate no pathologic enhancement. IMPRESSION: 1. Atherosclerotic changes with predominantly noncalcified plaque at the aortic arch, the distal left common carotid artery, both carotid bifurcations, and the cavernous internal carotid arteries. There are no significant focal stenoses. 2. Mild distal small vessel intracranial atherosclerotic changes in both the anterior and posterior circulation. 3. No significant proximal stenosis, aneurysm, or branch vessel occlusion within the circle of Willis. 4. Endplate degenerative changes and uncovertebral spurring at C3-4 and C6-7. Electronically Signed   By: San Morelle [name]   On: 03/10/2016 08:00   Mr Jeri Cos [id number] Contrast  03/10/2016  CLINICAL DATA:  Presyncope EXAM: MRI HEAD WITHOUT AND WITH CONTRAST TECHNIQUE: Multiplanar, multiecho pulse sequences of the brain and surrounding structures were obtained without and with intravenous contrast. CONTRAST:  98mL MULTIHANCE GADOBENATE DIMEGLUMINE 529 MG/ML IV SOLN COMPARISON:  01/15/2016 FINDINGS: Calvarium and upper cervical spine: No focal marrow signal  abnormality. Orbits: Negative. Sinuses and Mastoids: Clear. Brain: No acute abnormality such as acute infarct, hemorrhage, hydrocephalus, or mass lesion. No evidence of large vessel occlusion. Patchy FLAIR hyperintensity in the cerebral white matter compatible with chronic vessel disease, mild for age. IMPRESSION: 1. No acute finding or explanation for the history. 2. Mild chronic small vessel disease. Electronically Signed   By: Monte Fantasia [name]   On: 03/10/2016 08:49      Imaging Review Dg Chest 1 View  03/13/2016  CLINICAL DATA:  70 year old male with a history of hypotension and vomiting EXAM: CHEST 1 VIEW COMPARISON:  11/25/2015, 09/15/2015 FINDINGS: Cardiomediastinal silhouette unchanged in size and contour. Surgical changes of median sternotomy  and CABG. Compared to prior there is interlobular septal thickening and interstitial opacity. No pneumothorax. No confluent airspace disease. No displaced fracture. IMPRESSION: Evidence of early edema, less likely atypical infection. Surgical changes of median sternotomy and CABG. Signed, Dulcy Fanny. Earleen Newport, DO [facility] Electronically Signed   By: Corrie Mckusick D.O.   On: 03/13/2016 13:06   Ct Head Wo Contrast  03/13/2016  CLINICAL DATA:  Sudden onset of dizziness and vomiting this morning. Recent TIA. Weakness. EXAM: CT HEAD WITHOUT CONTRAST TECHNIQUE: Contiguous axial images were obtained from the base of the skull through the vertex without intravenous contrast. COMPARISON:  Brain MRI, 03/10/2016.  Head CT, 03/28/2015. FINDINGS: The ventricles are normal in configuration. There is ventricular and sulcal enlargement reflecting age related volume loss. No hydrocephalus. There are no parenchymal masses or mass effect. There is no evidence of a cortical infarct. There are no extra-axial masses or abnormal fluid collections. There is no intracranial hemorrhage. Visualized sinuses and mastoid air cells are clear. No skull lesion. IMPRESSION: 1. No acute intracranial abnormalities. 2. Age related volume loss. Electronically Signed   By: Lajean Manes [name]   On: 03/13/2016 13:10   I have personally reviewed and evaluated these images and lab results as part of my medical decision-making.   EKG Interpretation   Date/Time:  Saturday March 13 2016 11:58:03 EDT Ventricular Rate:  56 PR Interval:  195 QRS Duration: 139 QT Interval:  465 QTC Calculation: 449 R Axis:   24 Text Interpretation:  Sinus rhythm Right bundle branch block since last  tracing no significant change Confirmed by Anagabriela Jokerst  MD, Shantavia Jha ([id number]) on  03/13/2016 12:00:52 PM Also confirmed by Seyon Strader  MD, [place] ([id number]), editor  Gilford Rile, CCT, [place] ([id number])  on 03/13/2016 12:34:44 PM       MDM   Final diagnoses:  None    Patient presents with near-syncope and hypotension. He does have some slight left lower extremity weakness and slight facial drooping. His wife states that this is been present since his last hospitalization week ago. He did have an MRI at that time which was negative. He was markedly hypotensive on arrival. This has responded to IV fluids. He's had 1.5 L of IV fluids and his blood pressure is now in the low [id number] systolic. He's feeling a little bit better but is very fatigued. His chest x-ray does show evidence of increased interstitial markings which could be pulmonary edema versus infection. He's afebrile with a normal rectal temp. However he does have a mildly increased white count. He has a mildly elevated lactate as well. His BNP is normal which is not suggestive of pulmonary edema. He also had a recent echo on his last hospitalization which showed a normal EF. Given this, I did treat  him presumptively with Zosyn and vancomycin for possible sepsis although he technically doesn't meet SIRS criteria for sepsis.  His urine is still pending. Blood cultures and urine cultures were obtained. I'll consult the hospitalist at [facility] for transfer.  He doesn't have any evidence of an arrhythmia or heart block. He's mildly bradycardic which she was noted to be on his past hospitalization. He is on a beta blocker. He is not hypoxic nor does he have other suggestions of pulmonary embolus. He has nothing that we more suggestive of a thoracic dissection.  He does have some mild left-sided weakness but it does not seem likely that a acute stroke appear etiology for his hypotension. He could have some left-sided weakness resulting from the hypotension.    I spoke with Dr. Tamala Julian who has accepted pt to [facility].  Malvin Johns, MD 03/13/16 [place], MD 03/13/16 [phone]

## 2016-03-13 NOTE — ED Notes (Signed)
PT denies any chest pain, some SOB, wife states gait was unsteady this am

## 2016-03-13 NOTE — ED Notes (Signed)
MD at bedside. 

## 2016-03-13 NOTE — Progress Notes (Addendum)
Transfer from [facility] [place] 70 year old male with diabetes mellitus, CAD s/p CABG, OSA, and hypertension; who presents with complaints of fatigue with an episodes of nausea vomiting while riding in the car. Patient was initially found to have systolic blood pressures in the 70s upon arrival of EMS. Blood pressure stabilized after being given IV fluids. He was recently discharged from hospital after extensive workup for presyncopal episodes. Patient was not found to have a temperature or the hypoxic. Patient was found to be bradycardic in the 50s and chest x-ray revealed patchy infiltrates with elevation white blood cell count to 11.4 and lactic acid of 2.09. Patient was treated as a code sepsis for possible HCAP started on vancomycin and Zosyn empirically and transferred here to a telemetry bed for further evaluation.

## 2016-03-13 NOTE — ED Notes (Signed)
[place]

## 2016-03-13 NOTE — H&P (Addendum)
[facility] History and Physical  [name] [name] [id number] DOB: [birth year] DOA: 03/13/2016  Referring physician: EDP PCP: Penni Homans, MD   Chief Complaint: SOB, near syncope   HPI: [name] [name] is a 70 y.o. male who presents to the ED at [facility] with an episode of near syncope.  Patient was just discharged from our service 2 days ago after a stay for similar symptoms.  Work up at that time was unclear for cause.  Today it seems more clear that patient is suffering from hypotension with his presyncope / confusion.  Initial BP was documented as low as 72/47 in the ED.  This rapidly improved to 0000000 systolic with just 1.5 L IVF.  Patient has also been having DOE for some time, no recent worsening of this or cough, but CXR today is suggestive of either early pulmonary vascular congestion vs PNA.  Of note his WBC has doubled to 11k since discharge 2 days ago.  Review of Systems: Systems reviewed.  As above, otherwise negative  Past Medical History  Diagnosis Date  . Diabetes mellitus type II, uncontrolled ([place])   . OSA (obstructive sleep apnea)   . Hyperlipidemia   . RBBB (right bundle branch block)   . Depression     Bipolar  . Edema   . Hypertension   . Falling episodes     these have occurred in the past and again recurring 2011  . Spondylosis     C5-6, C6-7 MRI 2010  . CAD (coronary artery disease)     A. CABG in 2000,status post cardiac cath in 2006, 2009 ....continued chest pain and SOB despite oral medication adjestments including Ranexa. B. Cath November 2009/ mRCA - 2.75 x 23 Abbott Xience V drug-eluting stent ...11/26/2008 to distal  RCA leading to acute marginal.  C. Cath 07/2012 for CP - stable anatomy, med rx. d. cath 2015 and 05/30/2015 stable anatomy, consider Myoview if has CP again  . Hx of CABG     2000,  / one median sternotomy suture broken her chest x-ray November, 2010, no clinical significance  . Palpitations     event recorder showed sinus  rhythm  . Nephrolithiasis   . Restless leg   . Dizziness   . Shortness of breath     CPX April, 2011, mild functional limitation, no clear pulmonary or cardiac limitation, possible deconditioning and mild chronotropic incompetence( peak heart rate 130)  . Cerebral ischemia     MRI November, 2010, chronic microvascular ischemia  . Anemia     hemoglobin 7.4, iron deficiency, January, 2011, 2 unit transfusion, endoscopy normal, capsule endoscopy February, 2011 no small bowel abnormalities.   Most likely source gastric erosions, followed by GI  . H/O medication noncompliance     Due to loss of insurance  . Ejection fraction     EF 60%, echo, July 31, 2012  . Diabetes mellitus without complication ([place])   . Pain in limb 06/12/2009    Qualifier: Diagnosis of  By: [name]   . Low back pain 06/12/2009    Qualifier: Diagnosis of  By: [name]   . Thyroid disease   . Right knee pain 01/07/2015  . Advance care planning 05/01/2015  . ACP (advance care planning) 05/11/2015  . Nausea with vomiting 01/25/2016  . Headache 01/25/2016  . Diarrhea 02/15/2016  . Gastric ulcer   . Tubulovillous adenoma of colon 2007  . Iron deficiency anemia   . Syncope 03/2016  .  Chronic diastolic CHF (congestive heart failure) ([place]) 03/13/2016   Past Surgical History  Procedure Laterality Date  . Nasal septum surgery      UP3  . Coronary artery bypass graft      2000  . Left heart catheterization with coronary/graft angiogram N/A 08/01/2012    Procedure: LEFT HEART CATHETERIZATION WITH Beatrix Fetters;  Surgeon: [name], MD;  Location: [facility] CATH LAB;  Service: Cardiovascular;  Laterality: N/A;  . Left heart catheterization with coronary/graft angiogram N/A 01/03/2015    Procedure: LEFT HEART CATHETERIZATION WITH Beatrix Fetters;  Surgeon: [name], MD;  Location: [facility] CATH LAB;  Service: Cardiovascular;  Laterality: N/A;  . Percutaneous coronary stent intervention (pci-s)   10/2008    mRCA PCI  2.75 x 23 Abbott Xience V drug-eluting stent   . Cardiac catheterization N/A 05/30/2015    Procedure: Left Heart Cath and Coronary Angiography;  Surgeon: [name], MD;  Location: [place] CV LAB;  Service: Cardiovascular;  Laterality: N/A;   Social History:  reports that he has never smoked. He has never used smokeless tobacco. He reports that he does not drink alcohol or use illicit drugs.  Allergies  Allergen Reactions  . Morphine Other (See Comments)    hallucinations  . Shellfish-Derived Products Itching    Family History  Problem Relation Age of Onset  . Cancer Brother     pancreatic cancer  . Diabetes Brother     4   . Stroke Brother   . Diabetes Brother   . Cancer Brother   . Diabetes Brother   . Coronary artery disease Brother   . Diabetes Brother   . Hypothyroidism Brother   . Heart attack      Nephew  . Diabetes Mother   . Heart failure Mother   . Heart failure Father   . Irregular heart beat Daughter   . Cancer Maternal Grandmother     unknown      Prior to Admission medications   Medication Sig Start Date End Date Taking? Authorizing Provider  atorvastatin (LIPITOR) 40 MG tablet Take 1 tablet (40 mg total) by mouth daily at 6 PM. 05/30/15   Almyra Deforest, PA  baclofen (LIORESAL) 10 MG tablet Take 0.5 tablets (5 mg total) by mouth at bedtime. 03/11/16   Bonnielee Haff, MD  Cholecalciferol (DIALYVITE VITAMIN D3 MAX) 13086 units TABS Take 1 tablet by mouth once a week. 01/16/16   [name], MD  clopidogrel (PLAVIX) 75 MG tablet Take 1 tablet (75 mg total) by mouth daily. 03/11/16   Bonnielee Haff, MD  clotrimazole-betamethasone (LOTRISONE) cream APPLY TO THE AFFECTED TWICE DAILY Patient taking differently: Apply 1 application topically 2 (two) times daily. APPLY TO THE AFFECTED TWICE DAILY 02/09/16   [name], MD  diclofenac sodium (VOLTAREN) 1 % GEL Apply 2 g topically 3 (three) times daily as needed. For pain to affected area 05/01/15    [name], MD  gabapentin (NEURONTIN) 300 MG capsule 2 capsules in AM, 1 capsule at noon, 2 capsules in PM daily Patient taking differently: Take 300 mg by mouth See admin instructions. 2 capsules in AM, 1 capsule at noon, 2 capsules in PM daily 02/09/16   [name], MD  glucose blood (ACCU-CHEK AVIVA) test strip Use as directed to check blood sugar 4 times daily.  DX E11.9 02/09/16   [name], MD  hyoscyamine (ANASPAZ) 0.125 MG TBDP disintergrating tablet Place 1 tablet (0.125 mg total)  under the tongue every 4 (four) hours as needed for cramping. 01/16/16   [name], MD  insulin aspart (NOVOLOG) 100 UNIT/ML injection Inject 10-17 Units into the skin 3 (three) times daily with meals. Use 10-17 unit 3 times daily before meals Patient taking differently: Inject 10-17 Units into the skin 3 (three) times daily with meals. Use 10-17 unit 3 times daily before meals per sliding scale 02/09/16   [name], MD  Insulin Glargine (TOUJEO SOLOSTAR) 300 UNIT/ML SOPN Inject 42 Units into the skin at bedtime. 09/16/15   [name], MD  isosorbide mononitrate (IMDUR) 120 MG 24 hr tablet Take 1 tablet (120 mg total) by mouth daily. 12/19/15   [name], MD  isosorbide mononitrate (IMDUR) 60 MG 24 hr tablet Take 60 mg by mouth daily. 03/08/16   Historical Provider, MD  levothyroxine (SYNTHROID, LEVOTHROID) 75 MCG tablet Take 1 tablet (75 mcg total) by mouth daily. 02/09/16   [name], MD  metFORMIN (GLUCOPHAGE) 1000 MG tablet Take 1 tablet (1,000 mg total) by mouth 2 (two) times daily with a meal. 06/12/15   [name], MD  metoprolol tartrate (LOPRESSOR) 25 MG tablet Take 0.5 tablets (12.5 mg total) by mouth daily. PLEASE HOLD FOR 3 DAYS AND THEN RESUME ONCE A DAY. 03/15/16   Bonnielee Haff, MD  nitroGLYCERIN (NITROSTAT) 0.4 MG SL tablet Place 1 tablet (0.4 mg total) under the tongue every 5 (five) minutes as needed (up to 3 doses). Patient taking differently: Place 0.4 mg under the  tongue every 5 (five) minutes as needed for chest pain (up to 3 doses).  02/09/16   [name], MD  ondansetron (ZOFRAN-ODT) 4 MG disintegrating tablet Take 1 tablet (4 mg total) by mouth every 8 (eight) hours as needed for nausea or vomiting. 01/16/16   [name], MD  pantoprazole (PROTONIX) 40 MG tablet Take 40 mg by mouth daily.    Historical Provider, MD  pramipexole (MIRAPEX) 0.5 MG tablet Take 1-2 tablets (0.5-1 mg total) by mouth at bedtime. 12/19/15   [name], MD  pramipexole (MIRAPEX) 1 MG tablet Take 1 mg by mouth at bedtime. 03/08/16   Historical Provider, MD  RANEXA 500 MG 12 hr tablet Take 500 mg by mouth 2 (two) times daily. 03/08/16   Historical Provider, MD  sertraline (ZOLOFT) 100 MG tablet Take 1 tablet (100 mg total) by mouth 2 (two) times daily. 07/24/15   [name], MD  tiZANidine (ZANAFLEX) 4 MG tablet Take 1 tablet (4 mg total) by mouth every 8 (eight) hours as needed for muscle spasms. 11/19/15   Pieter Partridge, DO  topiramate (TOPAMAX) 50 MG tablet Take 1 tablet (50 mg total) by mouth at bedtime. 04/25/15   Pieter Partridge, DO   Physical Exam: Filed Vitals:   03/13/16 1710 03/13/16 1844  BP: 103/63 146/68  Pulse: 54 58  Temp:  97.5 F (36.4 C)  Resp: 0 25    BP 146/68 mmHg  Pulse 58  Temp(Src) 97.5 F (36.4 C) (Oral)  Resp 25  Ht 5\' 9"  (1.753 m)  Wt 82.4 kg (181 lb 10.5 oz)  BMI 26.81 kg/m2  SpO2 100%  General Appearance:    Alert, oriented, no distress, appears stated age  Head:    Normocephalic, atraumatic  Eyes:    PERRL, EOMI, sclera non-icteric        Nose:   Nares without drainage or epistaxis. Mucosa, turbinates normal  Throat:  Moist mucous membranes. Oropharynx without erythema or exudate.  Neck:   Supple. No carotid bruits.  No thyromegaly.  No lymphadenopathy.   Back:     No CVA tenderness, no spinal tenderness  Lungs:     Clear to auscultation bilaterally, without wheezes, rhonchi or rales  Chest wall:    No tenderness to palpitation   Heart:    Regular rate and rhythm without murmurs, gallops, rubs  Abdomen:     Soft, non-tender, nondistended, normal bowel sounds, no organomegaly  Genitalia:    deferred  Rectal:    deferred  Extremities:   No clubbing, cyanosis or edema.  Pulses:   2+ and symmetric all extremities  Skin:   Skin color, texture, turgor normal, no rashes or lesions  Lymph nodes:   Cervical, supraclavicular, and axillary nodes normal  Neurologic:   CNII-XII intact. Normal strength, sensation and reflexes      throughout    Labs on Admission:  Basic Metabolic Panel:  Recent Labs Lab 03/08/16 1728 03/09/16 2036 03/10/16 0550 03/11/16 0344 03/13/16 1155  NA 134* 134* 142 140 136  K 4.7 4.4 3.4* 3.9 3.9  CL 100* 105 108 107 108  CO2 26 17* 24 22 21*  GLUCOSE 450* 445* 86 209* 97  BUN 15 15 12 10 17   CREATININE 1.18 1.25* 1.05 0.99 1.56*  CALCIUM 9.1 8.6* 8.7* 9.3 9.4  MG  --   --  1.8  --   --   PHOS  --   --  4.0  --   --    Liver Function Tests:  Recent Labs Lab 03/08/16 1728 03/09/16 2036 03/10/16 0550 03/13/16 1155  AST 26 23 20 27   ALT 34 29 26 28   ALKPHOS 82 89 61 65  BILITOT 0.7 0.8 0.7 0.9  PROT 7.2 5.7* 5.7* 7.0  ALBUMIN 4.3 3.5 3.4* 4.3    Recent Labs Lab 03/13/16 1155  LIPASE 11   No results for input(s): AMMONIA in the last 168 hours. CBC:  Recent Labs Lab 03/08/16 1728 03/09/16 2036 03/10/16 0550 03/11/16 0344 03/13/16 1155  WBC 6.6 8.0 6.1 6.6 11.4*  NEUTROABS 4.0 5.2 3.4  --  6.7  HGB 13.5 12.2* 11.8* 12.6* 13.4  HCT 38.3* 34.0* 34.3* 36.5* 37.6*  MCV 89.5 88.3 87.5 88.0 88.1  PLT 148* 144* 138* 153 252   Cardiac Enzymes:  Recent Labs Lab 03/08/16 1728 03/09/16 2036 03/13/16 1155  TROPONINI <0.03 <0.03 <0.03    BNP (last 3 results) No results for input(s): PROBNP in the last 8760 hours. CBG:  Recent Labs Lab 03/10/16 1658 03/10/16 2107 03/11/16 0742 03/13/16 1155 03/13/16 1839  GLUCAP 143* 266* 219* 84 126*    Radiological  Exams on Admission: Dg Chest 1 View  03/13/2016  CLINICAL DATA:  70 year old male with a history of hypotension and vomiting EXAM: CHEST 1 VIEW COMPARISON:  11/25/2015, 09/15/2015 FINDINGS: Cardiomediastinal silhouette unchanged in size and contour. Surgical changes of median sternotomy and CABG. Compared to prior there is interlobular septal thickening and interstitial opacity. No pneumothorax. No confluent airspace disease. No displaced fracture. IMPRESSION: Evidence of early edema, less likely atypical infection. Surgical changes of median sternotomy and CABG. Signed, Dulcy Fanny. Earleen Newport, DO [facility] Radiology Electronically Signed   By: Corrie Mckusick D.O.   On: 03/13/2016 13:06   Ct Head Wo Contrast  03/13/2016  CLINICAL DATA:  Sudden onset of dizziness and vomiting this morning. Recent TIA. Weakness. EXAM: CT HEAD  WITHOUT CONTRAST TECHNIQUE: Contiguous axial images were obtained from the base of the skull through the vertex without intravenous contrast. COMPARISON:  Brain MRI, 03/10/2016.  Head CT, 03/28/2015. FINDINGS: The ventricles are normal in configuration. There is ventricular and sulcal enlargement reflecting age related volume loss. No hydrocephalus. There are no parenchymal masses or mass effect. There is no evidence of a cortical infarct. There are no extra-axial masses or abnormal fluid collections. There is no intracranial hemorrhage. Visualized sinuses and mastoid air cells are clear. No skull lesion. IMPRESSION: 1. No acute intracranial abnormalities. 2. Age related volume loss. Electronically Signed   By: Lajean Manes [name]   On: 03/13/2016 13:10    EKG: Independently reviewed.  Assessment/Plan Principal Problem:   Hypotension Active Problems:   Essential hypertension   Diabetes mellitus, type 2 (HCC)   HCAP (healthcare-associated pneumonia)   Chronic diastolic CHF (congestive heart failure) ([place])   1. Hypotension - suspicious  picture for HCAP 1. Improved already with just 1.5L IVF 2. Will hold diuretics and home BP meds, metoprolol was already on hold from last admit. 2. HCAP - 1. Will treat as HCAP for now 2. Cefepime and vanc 3. PNA pathway 4. Cultures pending 5. Repeat CBC and BMP in AM 3. Chronic diastolic CHF - 1. Given improvement with IVF as above, elevation in WBC, normal BNP today, lack of peripheral edema, I dont think that his CXR findings represent acute CHF. 4. DM2 - 1. Lantus reduce to 30 units 2. Mod dose SSI AC/HS 3. 4 units mealtime    Code Status: Full  Family Communication: No family in room Disposition Plan: Admit to inpatient   Time spent: 43 min  GARDNER, JARED M. [facility] Pager [phone]  If 7AM-7PM, please contact the day team taking care of the patient [email or web address] Password [email or web address] 03/13/2016, 7:52 PM

## 2016-03-13 NOTE — ED Notes (Signed)
[facility] given hand off report, at bedside for transport to [facility]

## 2016-03-13 NOTE — ED Notes (Signed)
Pt placed on cont cardiac monitoring with int NBP q71min with cont POX

## 2016-03-13 NOTE — ED Notes (Signed)
Resting quietly. Eyes closed. Rise and fall of chest noted. Wife at bedside.

## 2016-03-13 NOTE — ED Notes (Signed)
Pts color much improved, pt very alert, talkative to staff, follows commands w/o hestitation

## 2016-03-13 NOTE — ED Notes (Signed)
Pt assisted to stand at bedside to void, unable to void at this time, required some assistance for standing

## 2016-03-14 LAB — BASIC METABOLIC PANEL
Anion gap: 11 (ref 5–15)
BUN: 13 mg/dL (ref 6–20)
CALCIUM: 9.3 mg/dL (ref 8.9–10.3)
CO2: 22 mmol/L (ref 22–32)
CREATININE: 1.2 mg/dL (ref 0.61–1.24)
Chloride: 107 mmol/L (ref 101–111)
GFR calc non Af Amer: >60 mL/min (ref >60)
GLUCOSE: 225 mg/dL — AB (ref 65–99)
Potassium: 3.9 mmol/L (ref 3.5–5.1)
Sodium: 140 mmol/L (ref 135–145)

## 2016-03-14 LAB — CBC
HEMATOCRIT: 35.7 % — AB (ref 39.0–52.0)
Hemoglobin: 12.6 g/dL — ABNORMAL LOW (ref 13.0–17.0)
MCH: 30.7 pg (ref 26.0–34.0)
MCHC: 35.3 g/dL (ref 30.0–36.0)
MCV: 87.1 fL (ref 78.0–100.0)
PLATELETS: 167 10*3/uL (ref 150–400)
RBC: 4.1 MIL/uL — ABNORMAL LOW (ref 4.22–5.81)
RDW: 13 % (ref 11.5–15.5)
WBC: 8.1 10*3/uL (ref 4.0–10.5)

## 2016-03-14 LAB — HIV ANTIBODY (ROUTINE TESTING W REFLEX): HIV SCREEN 4TH GENERATION: NONREACTIVE

## 2016-03-14 LAB — GLUCOSE, CAPILLARY
GLUCOSE-CAPILLARY: 126 mg/dL — AB (ref 65–99)
Glucose-Capillary: 147 mg/dL — ABNORMAL HIGH (ref 65–99)
Glucose-Capillary: 174 mg/dL — ABNORMAL HIGH (ref 65–99)
Glucose-Capillary: 248 mg/dL — ABNORMAL HIGH (ref 65–99)
Glucose-Capillary: 318 mg/dL — ABNORMAL HIGH (ref 65–99)

## 2016-03-14 MED ORDER — ACETAMINOPHEN 325 MG PO TABS
650.0000 mg | ORAL_TABLET | Freq: Four times a day (QID) | ORAL | Status: DC | PRN
  Administered 2016-03-14 – 2016-03-15 (×4): 650 mg via ORAL
  Filled 2016-03-14 (×4): qty 2

## 2016-03-14 NOTE — Progress Notes (Signed)
Patient was transferred to room 3E27 from [facility]. Admit with Syncope. SR on telemetry. Alert and oriented X 4. No complaints of pain. Patient has no skin breakdown. Vital signs stable. Patient oriented to room and advised to call for help getting out of bed.

## 2016-03-14 NOTE — Progress Notes (Signed)
PATIENT DETAILS Name: [name] [name] Age: 70 y.o. Sex: male Date of Birth: 11-Feb-1946 Admit Date: 03/13/2016 Admitting Physician Norval Morton, MD [id number], Erline Levine, MD  Subjective: Feels dizzy upon standing.  Assessment/Plan: Principal Problem: Hypotension: ? Etiology. No indication of infection, did have one episode of vomiting yesterday. Over does not appear dehydrated on exam. I will stop IV fluids, stop empiric antibiotics (do not think patient has PNA)-check a.m. cortisol level.  Active Problems: Orthostatic hypotension: Suspect this is related to autonomic neuropathy from diabetes, no convincing history of volume loss at present. Check cortisol level tomorrow morning. Place thigh-high TED hose, hold antihypertensives.   History of hypertension: Although mild permissive hypertension in a setting of orthostatic vital signs. Continue to hold beta blocker.  Insulin-dependent diabetes: CBGs stable with 20 units of Lantus and 4 units of NovoLog. Follow  History of peripheral neuropathy: Continue Neurontin and Requip.  History of CAD s/p CABG: Continue Plavix and Lipitor. Hold beta blocker given orthostatic vital signs. Spent shortness of breath  ? TIA in the past: Doubt TIA-suspect recent presentation was probably secondary to orthostatic mechanisms.  Disposition: Remain inpatient-home tomorrow  Antimicrobial agents  See below  Anti-infectives    Start     Dose/Rate Route Frequency Ordered Stop   03/14/16 1500  vancomycin (VANCOCIN) 1,250 mg in sodium chloride 0.9 % 250 mL IVPB  Status:  Discontinued     1,250 mg 166.7 mL/hr over 90 Minutes Intravenous Every 24 hours 03/13/16 1917 03/13/16 1920   03/14/16 1500  vancomycin (VANCOCIN) 1,250 mg in sodium chloride 0.9 % 250 mL IVPB     1,250 mg 166.7 mL/hr over 90 Minutes Intravenous Every 24 hours 03/13/16 1922     03/13/16 2000  ceFEPIme (MAXIPIME) 2 g in dextrose 5 % 50 mL IVPB  Status:  Discontinued      2 g 100 mL/hr over 30 Minutes Intravenous Every 24 hours 03/13/16 1917 03/13/16 1920   03/13/16 2000  ceFEPIme (MAXIPIME) 2 g in dextrose 5 % 50 mL IVPB     2 g 100 mL/hr over 30 Minutes Intravenous Every 24 hours 03/13/16 1922     03/13/16 1430  piperacillin-tazobactam (ZOSYN) IVPB 3.375 g     3.375 g 100 mL/hr over 30 Minutes Intravenous  Once 03/13/16 1418 03/13/16 1457   03/13/16 1430  vancomycin (VANCOCIN) IVPB 1000 mg/200 mL premix     1,000 mg 200 mL/hr over 60 Minutes Intravenous  Once 03/13/16 1418 03/13/16 1554      DVT Prophylaxis: Prophylactic  Heparin  Code Status: Full code   Family Communication None at bedside  Procedures: None  CONSULTS:  None  Time spent 40 minutes-Greater than 50% of this time was spent in counseling, explanation of diagnosis, planning of further management, and coordination of care.  MEDICATIONS: Scheduled Meds: . atorvastatin  40 mg Oral q1800  . baclofen  5 mg Oral QHS  . ceFEPime (MAXIPIME) IV  2 g Intravenous Q24H  . clopidogrel  75 mg Oral Daily  . clotrimazole-betamethasone  1 application Topical BID  . gabapentin  300 mg Oral Q1200  . gabapentin  600 mg Oral BID  . heparin  5,000 Units Subcutaneous 3 times per day  . insulin aspart  0-15 Units Subcutaneous TID WC  . insulin aspart  4 Units Subcutaneous TID WC  . insulin glargine  30 Units Subcutaneous QHS  . levothyroxine  75  mcg Oral QAC breakfast  . pantoprazole  40 mg Oral Daily  . pramipexole  1 mg Oral QHS  . sertraline  100 mg Oral BID  . topiramate  50 mg Oral QHS  . vancomycin  1,250 mg Intravenous Q24H   Continuous Infusions:  PRN Meds:.acetaminophen, ondansetron    PHYSICAL EXAM: Vital signs in last 24 hours: Filed Vitals:   03/14/16 0616 03/14/16 0620 03/14/16 0623 03/14/16 1244  BP: 121/53 145/69 117/72 150/63  Pulse: 58 67  61  Temp: 97.6 F (36.4 C)   97.6 F (36.4 C)  TempSrc: Oral   Oral  Resp: 16   20  Height:      Weight: 79.788 kg  (175 lb 14.4 oz)     SpO2: 96%   95%    Weight change:  Filed Weights   03/13/16 1844 03/13/16 2153 03/14/16 0616  Weight: 82.4 kg (181 lb 10.5 oz) 81.421 kg (179 lb 8 oz) 79.788 kg (175 lb 14.4 oz)   Body mass index is 25.96 kg/(m^2).   Gen Exam: Awake and alert with clear speech.   Neck: Supple, No JVD.   Chest: B/L Clear.   CVS: S1 S2 Regular, no murmurs.  Abdomen: soft, BS +, non tender, non distended.  Extremities: no edema, lower extremities warm to touch. Neurologic: Non Focal.   Skin: No Rash.   Wounds: N/A.   Intake/Output from previous day:  Intake/Output Summary (Last 24 hours) at 03/14/16 1449 Last data filed at 03/14/16 1435  Gross per 24 hour  Intake   1760 ml  Output   3575 ml  Net  -1815 ml     LAB RESULTS: CBC  Recent Labs Lab 03/08/16 1728 03/09/16 2036 03/10/16 0550 03/11/16 0344 03/13/16 1155 03/14/16 0318  WBC 6.6 8.0 6.1 6.6 11.4* 8.1  HGB 13.5 12.2* 11.8* 12.6* 13.4 12.6*  HCT 38.3* 34.0* 34.3* 36.5* 37.6* 35.7*  PLT 148* 144* 138* 153 252 167  MCV 89.5 88.3 87.5 88.0 88.1 87.1  MCH 31.5 31.7 30.1 30.4 31.4 30.7  MCHC 35.2 35.9 34.4 34.5 35.6 35.3  RDW 13.2 13.2 13.2 13.2 13.2 13.0  LYMPHSABS 1.7 2.0 2.0  --  3.5  --   MONOABS 0.6 0.6 0.5  --  0.9  --   EOSABS 0.2 0.2 0.2  --  0.3  --   BASOSABS 0.0 0.0 0.0  --  0.0  --     Chemistries   Recent Labs Lab 03/09/16 2036 03/10/16 0550 03/11/16 0344 03/13/16 1155 03/14/16 0318  NA 134* 142 140 136 140  K 4.4 3.4* 3.9 3.9 3.9  CL 105 108 107 108 107  CO2 17* 24 22 21* 22  GLUCOSE 445* 86 209* 97 225*  BUN 15 12 10 17 13   CREATININE 1.25* 1.05 0.99 1.56* 1.20  CALCIUM 8.6* 8.7* 9.3 9.4 9.3  MG  --  1.8  --   --   --     CBG:  Recent Labs Lab 03/13/16 1155 03/13/16 1839 03/14/16 0040 03/14/16 0648 03/14/16 1141  GLUCAP 84 126* 248* 174* 147*    GFR Estimated Creatinine Clearance: 59.7 mL/min (by C-G formula based on Cr of 1.2).  Coagulation profile No results  for input(s): INR, PROTIME in the last 168 hours.  Cardiac Enzymes  Recent Labs Lab 03/08/16 1728 03/09/16 2036 03/13/16 1155  TROPONINI <0.03 <0.03 <0.03    Invalid input(s): POCBNP No results for input(s): DDIMER in the last 72 hours. No results for  input(s): HGBA1C in the last 72 hours. No results for input(s): CHOL, HDL, LDLCALC, TRIG, CHOLHDL, LDLDIRECT in the last 72 hours. No results for input(s): TSH, T4TOTAL, T3FREE, THYROIDAB in the last 72 hours.  Invalid input(s): FREET3 No results for input(s): VITAMINB12, FOLATE, FERRITIN, TIBC, IRON, RETICCTPCT in the last 72 hours.  Recent Labs  03/13/16 1155  LIPASE 11    Urine Studies No results for input(s): UHGB, CRYS in the last 72 hours.  Invalid input(s): UACOL, UAPR, USPG, UPH, UTP, UGL, UKET, UBIL, UNIT, UROB, ULEU, UEPI, UWBC, URBC, UBAC, CAST, UCOM, BILUA  MICROBIOLOGY: Recent Results (from the past 240 hour(s))  MRSA PCR Screening     Status: None   Collection Time: 03/13/16  6:44 PM  Result Value Ref Range Status   MRSA by PCR NEGATIVE NEGATIVE Final    Comment:        The GeneXpert MRSA Assay (FDA approved for NASAL specimens only), is one component of a comprehensive MRSA colonization surveillance program. It is not intended to diagnose MRSA infection nor to guide or monitor treatment for MRSA infections.   Urine culture     Status: None (Preliminary result)   Collection Time: 03/13/16  8:49 PM  Result Value Ref Range Status   Specimen Description URINE, CLEAN CATCH  Final   Special Requests NONE  Final   Culture NO GROWTH < 24 HOURS  Final   Report Status PENDING  Incomplete    RADIOLOGY STUDIES/RESULTS: Ct Angio Head W/cm &/or Wo Cm  03/10/2016  CLINICAL DATA:  Presyncopal episode. Confusion versus seizure. The patient develops slurred speech during these episodes. EXAM: CT ANGIOGRAPHY HEAD AND NECK TECHNIQUE: Multidetector CT imaging of the head and neck was performed using the standard  protocol during bolus administration of intravenous contrast. Multiplanar CT image reconstructions and MIPs were obtained to evaluate the vascular anatomy. Carotid stenosis measurements (when applicable) are obtained utilizing NASCET criteria, using the distal internal carotid diameter as the denominator. CONTRAST:  50 mL Isovue 370 COMPARISON:  MRI brain 01/15/2016. FINDINGS: CT HEAD Brain: Mild periventricular and subcortical white matter changes are stable. No acute cortical infarct, hemorrhage, or mass lesion is present. The ventricles are of normal size. The basal ganglia are intact. The insular ribbon is normal bilaterally. Calvarium and skull base: Within normal limits. Paranasal sinuses: Clear Orbits: Unremarkable CTA NECK Aortic arch: A 3 vessel arch configuration is present. Mural thickening is present at the aortic arch without focal stenosis or aneurysm. Right carotid system: The right common carotid artery is within normal limits. There is some atherosclerotic irregularity at the bifurcation without a significant stenosis. The cervical right ICA is normal. Left carotid system: Wall thickening is evident along the anteromedial border of the distal left common carotid artery. Mild atherosclerotic changes are noted at the carotid bifurcation. The cervical left ICA is otherwise normal. Vertebral arteries:The vertebral arteries both originate from the subclavian arteries. Right vertebral artery is the dominant vessel. No focal stenosis or vascular injury is evident in either vertebral artery within the neck. Skeleton: Endplate degenerative changes and uncovertebral spurring are most evident at C3-4 and C6-7. There is a osseous foraminal narrowing at both of these levels. The bone windows are otherwise unremarkable. There is slight anterior translation of the right mandibular condyle compared to the left. No focal lytic or blastic lesions are present. Other neck: No focal mucosal or submucosal lesions are  present. The vocal cords are midline and symmetric. The tongue base is unremarkable. No significant  cervical adenopathy is present. CTA HEAD Anterior circulation: Mild atherosclerotic irregularity in calcifications are present within the cavernous internal carotid arteries bilaterally. The ICA termini are intact. The right A1 segment is dominant. The A1 and M1 segments are intact. The MCA bifurcations are within normal limits bilaterally. There is some attenuation of distal MCA and ACA branch vessels bilaterally. No focal branch vessel occlusion is evident. The anterior communicating artery is patent. Posterior circulation: Atherosclerotic calcifications are evident at the dural margin of both vertebral arteries. There is moderate stenosis on the left. PICA origins are visualized and normal. The vertebrobasilar junction is intact. The basilar artery is normal. The right vertebral artery originates from the basilar tip. The left vertebral artery is predominantly from the posterior communicating artery with contribution from a left P1 segment. There is some attenuation of distal PCA branch vessels. Venous sinuses: The dural sinuses are patent. The right transverse sinus is dominant. The straight sinus and deep cerebral veins are intact. The cortical veins are intact. Anatomic variants: Fetal type left posterior cerebral artery with contribution from the P1 segment. Delayed phase: The postcontrast images demonstrate no pathologic enhancement. IMPRESSION: 1. Atherosclerotic changes with predominantly noncalcified plaque at the aortic arch, the distal left common carotid artery, both carotid bifurcations, and the cavernous internal carotid arteries. There are no significant focal stenoses. 2. Mild distal small vessel intracranial atherosclerotic changes in both the anterior and posterior circulation. 3. No significant proximal stenosis, aneurysm, or branch vessel occlusion within the circle of Willis. 4. Endplate  degenerative changes and uncovertebral spurring at C3-4 and C6-7. Electronically Signed   By: San Morelle [name]   On: 03/10/2016 08:00   Dg Chest 1 View  03/13/2016  CLINICAL DATA:  70 year old male with a history of hypotension and vomiting EXAM: CHEST 1 VIEW COMPARISON:  11/25/2015, 09/15/2015 FINDINGS: Cardiomediastinal silhouette unchanged in size and contour. Surgical changes of median sternotomy and CABG. Compared to prior there is interlobular septal thickening and interstitial opacity. No pneumothorax. No confluent airspace disease. No displaced fracture. IMPRESSION: Evidence of early edema, less likely atypical infection. Surgical changes of median sternotomy and CABG. Signed, Dulcy Fanny. Earleen Newport, DO [facility] Electronically Signed   By: Corrie Mckusick D.O.   On: 03/13/2016 13:06   Ct Head Wo Contrast  03/13/2016  CLINICAL DATA:  Sudden onset of dizziness and vomiting this morning. Recent TIA. Weakness. EXAM: CT HEAD WITHOUT CONTRAST TECHNIQUE: Contiguous axial images were obtained from the base of the skull through the vertex without intravenous contrast. COMPARISON:  Brain MRI, 03/10/2016.  Head CT, 03/28/2015. FINDINGS: The ventricles are normal in configuration. There is ventricular and sulcal enlargement reflecting age related volume loss. No hydrocephalus. There are no parenchymal masses or mass effect. There is no evidence of a cortical infarct. There are no extra-axial masses or abnormal fluid collections. There is no intracranial hemorrhage. Visualized sinuses and mastoid air cells are clear. No skull lesion. IMPRESSION: 1. No acute intracranial abnormalities. 2. Age related volume loss. Electronically Signed   By: Lajean Manes [name]   On: 03/13/2016 13:10   Ct Angio Neck W/cm &/or Wo/cm  03/10/2016  CLINICAL DATA:  Presyncopal episode. Confusion versus seizure. The patient develops slurred speech during these episodes. EXAM: CT  ANGIOGRAPHY HEAD AND NECK TECHNIQUE: Multidetector CT imaging of the head and neck was performed using the standard protocol during bolus administration of intravenous contrast. Multiplanar CT image reconstructions and MIPs were obtained to evaluate the vascular anatomy.  Carotid stenosis measurements (when applicable) are obtained utilizing NASCET criteria, using the distal internal carotid diameter as the denominator. CONTRAST:  50 mL Isovue 370 COMPARISON:  MRI brain 01/15/2016. FINDINGS: CT HEAD Brain: Mild periventricular and subcortical white matter changes are stable. No acute cortical infarct, hemorrhage, or mass lesion is present. The ventricles are of normal size. The basal ganglia are intact. The insular ribbon is normal bilaterally. Calvarium and skull base: Within normal limits. Paranasal sinuses: Clear Orbits: Unremarkable CTA NECK Aortic arch: A 3 vessel arch configuration is present. Mural thickening is present at the aortic arch without focal stenosis or aneurysm. Right carotid system: The right common carotid artery is within normal limits. There is some atherosclerotic irregularity at the bifurcation without a significant stenosis. The cervical right ICA is normal. Left carotid system: Wall thickening is evident along the anteromedial border of the distal left common carotid artery. Mild atherosclerotic changes are noted at the carotid bifurcation. The cervical left ICA is otherwise normal. Vertebral arteries:The vertebral arteries both originate from the subclavian arteries. Right vertebral artery is the dominant vessel. No focal stenosis or vascular injury is evident in either vertebral artery within the neck. Skeleton: Endplate degenerative changes and uncovertebral spurring are most evident at C3-4 and C6-7. There is a osseous foraminal narrowing at both of these levels. The bone windows are otherwise unremarkable. There is slight anterior translation of the right mandibular condyle compared to  the left. No focal lytic or blastic lesions are present. Other neck: No focal mucosal or submucosal lesions are present. The vocal cords are midline and symmetric. The tongue base is unremarkable. No significant cervical adenopathy is present. CTA HEAD Anterior circulation: Mild atherosclerotic irregularity in calcifications are present within the cavernous internal carotid arteries bilaterally. The ICA termini are intact. The right A1 segment is dominant. The A1 and M1 segments are intact. The MCA bifurcations are within normal limits bilaterally. There is some attenuation of distal MCA and ACA branch vessels bilaterally. No focal branch vessel occlusion is evident. The anterior communicating artery is patent. Posterior circulation: Atherosclerotic calcifications are evident at the dural margin of both vertebral arteries. There is moderate stenosis on the left. PICA origins are visualized and normal. The vertebrobasilar junction is intact. The basilar artery is normal. The right vertebral artery originates from the basilar tip. The left vertebral artery is predominantly from the posterior communicating artery with contribution from a left P1 segment. There is some attenuation of distal PCA branch vessels. Venous sinuses: The dural sinuses are patent. The right transverse sinus is dominant. The straight sinus and deep cerebral veins are intact. The cortical veins are intact. Anatomic variants: Fetal type left posterior cerebral artery with contribution from the P1 segment. Delayed phase: The postcontrast images demonstrate no pathologic enhancement. IMPRESSION: 1. Atherosclerotic changes with predominantly noncalcified plaque at the aortic arch, the distal left common carotid artery, both carotid bifurcations, and the cavernous internal carotid arteries. There are no significant focal stenoses. 2. Mild distal small vessel intracranial atherosclerotic changes in both the anterior and posterior circulation. 3. No  significant proximal stenosis, aneurysm, or branch vessel occlusion within the circle of Willis. 4. Endplate degenerative changes and uncovertebral spurring at C3-4 and C6-7. Electronically Signed   By: San Morelle [name]   On: 03/10/2016 08:00   Mr Jeri Cos [id number] Contrast  03/10/2016  CLINICAL DATA:  Presyncope EXAM: MRI HEAD WITHOUT AND WITH CONTRAST TECHNIQUE: Multiplanar, multiecho pulse sequences of the brain and surrounding structures were obtained without  and with intravenous contrast. CONTRAST:  66mL MULTIHANCE GADOBENATE DIMEGLUMINE 529 MG/ML IV SOLN COMPARISON:  01/15/2016 FINDINGS: Calvarium and upper cervical spine: No focal marrow signal abnormality. Orbits: Negative. Sinuses and Mastoids: Clear. Brain: No acute abnormality such as acute infarct, hemorrhage, hydrocephalus, or mass lesion. No evidence of large vessel occlusion. Patchy FLAIR hyperintensity in the cerebral white matter compatible with chronic vessel disease, mild for age. IMPRESSION: 1. No acute finding or explanation for the history. 2. Mild chronic small vessel disease. Electronically Signed   By: Monte Fantasia [name]   On: 03/10/2016 08:49    Oren Binet, MD  [facility] Pager:[phone]  If 7PM-7AM, please contact night-coverage [email or web address] Password [email or web address] 03/14/2016, 2:49 PM   LOS: 1 day

## 2016-03-15 ENCOUNTER — Ambulatory Visit: Payer: Commercial Managed Care - HMO | Admitting: Gastroenterology

## 2016-03-15 DIAGNOSIS — R55 Syncope and collapse: Secondary | ICD-10-CM

## 2016-03-15 DIAGNOSIS — R42 Dizziness and giddiness: Secondary | ICD-10-CM

## 2016-03-15 LAB — GLUCOSE, CAPILLARY
GLUCOSE-CAPILLARY: 191 mg/dL — AB (ref 65–99)
GLUCOSE-CAPILLARY: 188 mg/dL — AB (ref 65–99)
GLUCOSE-CAPILLARY: 280 mg/dL — AB (ref 65–99)
GLUCOSE-CAPILLARY: 348 mg/dL — AB (ref 65–99)

## 2016-03-15 LAB — URINE CULTURE

## 2016-03-15 LAB — CORTISOL: Cortisol, Plasma: 11.4 ug/dL

## 2016-03-15 NOTE — Progress Notes (Signed)
vLTM EEG running/ educated wife and nurse event button. No skin breakdown with hookup

## 2016-03-15 NOTE — Progress Notes (Signed)
Video eeg in progress ,. Electrodes in place pt torating well

## 2016-03-15 NOTE — Progress Notes (Addendum)
PATIENT DETAILS Name: [name] [name] Age: 70 y.o. Sex: male Date of Birth: [birth year] Admit Date: 03/13/2016 Admitting Physician Norval Morton, MD [id number], Erline Levine, MD  Brief Summary  (note further hx obtained from spouse at bedside 4/10) 70 yo male with PMHx of DM,CAD-who has worked as a Environmental education officer for 30 years and has had several concusion/head trauma over the past few years-brought to the hospital (2nd admit) for episodes of lightheadedness/confusion/slurred speech/right facial droop and left hand tremors that last for approximately 30 minutes, and then has a period of confusion that last for around 15 minutes. These episodes have been getting worse for the past 2-3 weeks. Recent MRI Brain neg for CVA, Recent EEG negative for seizures  Subjective: Continues to Feels dizzy upon standing.Per spouse continues to have these "spells" yesterday as well.   Assessment/Plan: Principal Problem: Episodes of lightheadedness/confusion/slurred speech/right facial droop and left hand tremors:given prior hx of head injury/concussion-highly suspicious for seizures. Will re-consult Neurology  Addendum:spoke with Dr Ernesto Rutherford will evaluate-but recommends Overnight Video EEG  Orthostatic Hypotension: ? Etiology. No indication of infection, did have one episode of vomiting yesterday. Does not appear dehydrated on exam. Cortisol level 11. Continue to hold metoprolol and repeat orthostatics today  Active Problems: Orthostatic hypotension: Suspect this is related to autonomic neuropathy from diabetes, no convincing history of volume loss at present. Check cortisol level tomorrow morning. Place thigh-high TED hose, hold antihypertensives.   History of hypertension: Allow mild permissive hypertension in a setting of orthostatic vital signs. Continue to hold beta blocker.  Insulin-dependent diabetes: CBGs stable with 20 units of Lantus and 4 units of NovoLog. Follow  History  of peripheral neuropathy: Continue Neurontin and Requip.  History of CAD s/p CABG: Continue Plavix and Lipitor. Hold beta blocker given orthostatic vital signs. Spent shortness of breath  ? TIA in the past: Doubt TIA-suspect recent presentation was probably secondary to orthostatic mechanisms.  Disposition: Remain inpatient  Antimicrobial agents  See below  Anti-infectives    Start     Dose/Rate Route Frequency Ordered Stop   03/14/16 1500  vancomycin (VANCOCIN) 1,250 mg in sodium chloride 0.9 % 250 mL IVPB  Status:  Discontinued     1,250 mg 166.7 mL/hr over 90 Minutes Intravenous Every 24 hours 03/13/16 1917 03/13/16 1920   03/14/16 1500  vancomycin (VANCOCIN) 1,250 mg in sodium chloride 0.9 % 250 mL IVPB  Status:  Discontinued     1,250 mg 166.7 mL/hr over 90 Minutes Intravenous Every 24 hours 03/13/16 1922 03/14/16 1454   03/13/16 2000  ceFEPIme (MAXIPIME) 2 g in dextrose 5 % 50 mL IVPB  Status:  Discontinued     2 g 100 mL/hr over 30 Minutes Intravenous Every 24 hours 03/13/16 1917 03/13/16 1920   03/13/16 2000  ceFEPIme (MAXIPIME) 2 g in dextrose 5 % 50 mL IVPB  Status:  Discontinued     2 g 100 mL/hr over 30 Minutes Intravenous Every 24 hours 03/13/16 1922 03/14/16 1454   03/13/16 1430  piperacillin-tazobactam (ZOSYN) IVPB 3.375 g     3.375 g 100 mL/hr over 30 Minutes Intravenous  Once 03/13/16 1418 03/13/16 1457   03/13/16 1430  vancomycin (VANCOCIN) IVPB 1000 mg/200 mL premix     1,000 mg 200 mL/hr over 60 Minutes Intravenous  Once 03/13/16 1418 03/13/16 1554      DVT Prophylaxis: Prophylactic  Heparin  Code Status: Full code  Family Communication Spouse at bedside  Procedures: None  CONSULTS:  None  Time spent 30 minutes-Greater than 50% of this time was spent in counseling, explanation of diagnosis, planning of further management, and coordination of care.  MEDICATIONS: Scheduled Meds: . atorvastatin  40 mg Oral q1800  . baclofen  5 mg Oral QHS  .  clopidogrel  75 mg Oral Daily  . clotrimazole-betamethasone  1 application Topical BID  . gabapentin  300 mg Oral Q1200  . gabapentin  600 mg Oral BID  . heparin  5,000 Units Subcutaneous 3 times per day  . insulin aspart  0-15 Units Subcutaneous TID WC  . insulin aspart  4 Units Subcutaneous TID WC  . insulin glargine  30 Units Subcutaneous QHS  . levothyroxine  75 mcg Oral QAC breakfast  . pantoprazole  40 mg Oral Daily  . pramipexole  1 mg Oral QHS  . sertraline  100 mg Oral BID  . topiramate  50 mg Oral QHS   Continuous Infusions:  PRN Meds:.acetaminophen, ondansetron   PHYSICAL EXAM: Vital signs in last 24 hours: Filed Vitals:   03/14/16 2000 03/15/16 0007 03/15/16 0618 03/15/16 0800  BP: 140/57 159/65 122/59 141/63  Pulse: 66 51 58 64  Temp: 97.6 F (36.4 C) 97.7 F (36.5 C) 97.6 F (36.4 C)   TempSrc: Oral Oral Oral   Resp: 18 18 18 18   Height:      Weight:   78.472 kg (173 lb)   SpO2: 95% 96% 96% 96%    Weight change: -3.928 kg (-8 lb 10.5 oz) Filed Weights   03/13/16 2153 03/14/16 0616 03/15/16 0618  Weight: 81.421 kg (179 lb 8 oz) 79.788 kg (175 lb 14.4 oz) 78.472 kg (173 lb)   Body mass index is 25.54 kg/(m^2).   Gen Exam: Awake and alert with clear speech.   Neck: Supple, No JVD.   Chest: B/L Clear.   CVS: S1 S2 Regular, no murmurs.  Abdomen: soft, BS +, non tender, non distended.  Extremities: no edema, lower extremities warm to touch. Neurologic: Non Focal.   Skin: No Rash.   Wounds: N/A.   Intake/Output from previous day:  Intake/Output Summary (Last 24 hours) at 03/15/16 0934 Last data filed at 03/15/16 0836  Gross per 24 hour  Intake   1790 ml  Output   3075 ml  Net  -1285 ml     LAB RESULTS: CBC  Recent Labs Lab 03/08/16 1728 03/09/16 2036 03/10/16 0550 03/11/16 0344 03/13/16 1155 03/14/16 0318  WBC 6.6 8.0 6.1 6.6 11.4* 8.1  HGB 13.5 12.2* 11.8* 12.6* 13.4 12.6*  HCT 38.3* 34.0* 34.3* 36.5* 37.6* 35.7*  PLT 148* 144* 138*  153 252 167  MCV 89.5 88.3 87.5 88.0 88.1 87.1  MCH 31.5 31.7 30.1 30.4 31.4 30.7  MCHC 35.2 35.9 34.4 34.5 35.6 35.3  RDW 13.2 13.2 13.2 13.2 13.2 13.0  LYMPHSABS 1.7 2.0 2.0  --  3.5  --   MONOABS 0.6 0.6 0.5  --  0.9  --   EOSABS 0.2 0.2 0.2  --  0.3  --   BASOSABS 0.0 0.0 0.0  --  0.0  --     Chemistries   Recent Labs Lab 03/09/16 2036 03/10/16 0550 03/11/16 0344 03/13/16 1155 03/14/16 0318  NA 134* 142 140 136 140  K 4.4 3.4* 3.9 3.9 3.9  CL 105 108 107 108 107  CO2 17* 24 22 21* 22  GLUCOSE 445* 86 209* 97 225*  BUN 15 12 10 17 13   CREATININE 1.25* 1.05 0.99 1.56* 1.20  CALCIUM 8.6* 8.7* 9.3 9.4 9.3  MG  --  1.8  --   --   --     CBG:  Recent Labs Lab 03/14/16 0648 03/14/16 1141 03/14/16 1627 03/14/16 2050 03/15/16 0612  GLUCAP 174* 147* 126* 318* 191*    GFR Estimated Creatinine Clearance: 59.7 mL/min (by C-G formula based on Cr of 1.2).  Coagulation profile No results for input(s): INR, PROTIME in the last 168 hours.  Cardiac Enzymes  Recent Labs Lab 03/08/16 1728 03/09/16 2036 03/13/16 1155  TROPONINI <0.03 <0.03 <0.03    Invalid input(s): POCBNP No results for input(s): DDIMER in the last 72 hours. No results for input(s): HGBA1C in the last 72 hours. No results for input(s): CHOL, HDL, LDLCALC, TRIG, CHOLHDL, LDLDIRECT in the last 72 hours. No results for input(s): TSH, T4TOTAL, T3FREE, THYROIDAB in the last 72 hours.  Invalid input(s): FREET3 No results for input(s): VITAMINB12, FOLATE, FERRITIN, TIBC, IRON, RETICCTPCT in the last 72 hours.  Recent Labs  03/13/16 1155  LIPASE 11    Urine Studies No results for input(s): UHGB, CRYS in the last 72 hours.  Invalid input(s): UACOL, UAPR, USPG, UPH, UTP, UGL, UKET, UBIL, UNIT, UROB, ULEU, UEPI, UWBC, URBC, UBAC, CAST, UCOM, BILUA  MICROBIOLOGY: Recent Results (from the past 240 hour(s))  Culture, blood (routine x 2)     Status: None (Preliminary result)   Collection Time:  03/13/16 12:50 PM  Result Value Ref Range Status   Specimen Description BLOOD LEFT AC  Final   Special Requests BOTTLES DRAWN AEROBIC AND ANAEROBIC 5ML  Final   Culture   Final    NO GROWTH < 24 HOURS Performed at [facility]    Report Status PENDING  Incomplete  Culture, blood (routine x 2)     Status: None (Preliminary result)   Collection Time: 03/13/16 12:50 PM  Result Value Ref Range Status   Specimen Description BLOOD LEFT HAND  Final   Special Requests BOTTLES DRAWN AEROBIC AND ANAEROBIC 2ML  Final   Culture   Final    NO GROWTH < 24 HOURS Performed at [facility]    Report Status PENDING  Incomplete  MRSA PCR Screening     Status: None   Collection Time: 03/13/16  6:44 PM  Result Value Ref Range Status   MRSA by PCR NEGATIVE NEGATIVE Final    Comment:        The GeneXpert MRSA Assay (FDA approved for NASAL specimens only), is one component of a comprehensive MRSA colonization surveillance program. It is not intended to diagnose MRSA infection nor to guide or monitor treatment for MRSA infections.   Urine culture     Status: None (Preliminary result)   Collection Time: 03/13/16  8:49 PM  Result Value Ref Range Status   Specimen Description URINE, CLEAN CATCH  Final   Special Requests NONE  Final   Culture NO GROWTH < 24 HOURS  Final   Report Status PENDING  Incomplete    RADIOLOGY STUDIES/RESULTS: Ct Angio Head W/cm &/or Wo Cm  03/10/2016  CLINICAL DATA:  Presyncopal episode. Confusion versus seizure. The patient develops slurred speech during these episodes. EXAM: CT ANGIOGRAPHY HEAD AND NECK TECHNIQUE: Multidetector CT imaging of the head and neck was performed using the standard protocol during bolus administration of intravenous contrast. Multiplanar CT image reconstructions and MIPs were obtained to evaluate the vascular anatomy. Carotid stenosis  measurements (when applicable) are obtained utilizing NASCET criteria, using the distal internal  carotid diameter as the denominator. CONTRAST:  50 mL Isovue 370 COMPARISON:  MRI brain 01/15/2016. FINDINGS: CT HEAD Brain: Mild periventricular and subcortical white matter changes are stable. No acute cortical infarct, hemorrhage, or mass lesion is present. The ventricles are of normal size. The basal ganglia are intact. The insular ribbon is normal bilaterally. Calvarium and skull base: Within normal limits. Paranasal sinuses: Clear Orbits: Unremarkable CTA NECK Aortic arch: A 3 vessel arch configuration is present. Mural thickening is present at the aortic arch without focal stenosis or aneurysm. Right carotid system: The right common carotid artery is within normal limits. There is some atherosclerotic irregularity at the bifurcation without a significant stenosis. The cervical right ICA is normal. Left carotid system: Wall thickening is evident along the anteromedial border of the distal left common carotid artery. Mild atherosclerotic changes are noted at the carotid bifurcation. The cervical left ICA is otherwise normal. Vertebral arteries:The vertebral arteries both originate from the subclavian arteries. Right vertebral artery is the dominant vessel. No focal stenosis or vascular injury is evident in either vertebral artery within the neck. Skeleton: Endplate degenerative changes and uncovertebral spurring are most evident at C3-4 and C6-7. There is a osseous foraminal narrowing at both of these levels. The bone windows are otherwise unremarkable. There is slight anterior translation of the right mandibular condyle compared to the left. No focal lytic or blastic lesions are present. Other neck: No focal mucosal or submucosal lesions are present. The vocal cords are midline and symmetric. The tongue base is unremarkable. No significant cervical adenopathy is present. CTA HEAD Anterior circulation: Mild atherosclerotic irregularity in calcifications are present within the cavernous internal carotid arteries  bilaterally. The ICA termini are intact. The right A1 segment is dominant. The A1 and M1 segments are intact. The MCA bifurcations are within normal limits bilaterally. There is some attenuation of distal MCA and ACA branch vessels bilaterally. No focal branch vessel occlusion is evident. The anterior communicating artery is patent. Posterior circulation: Atherosclerotic calcifications are evident at the dural margin of both vertebral arteries. There is moderate stenosis on the left. PICA origins are visualized and normal. The vertebrobasilar junction is intact. The basilar artery is normal. The right vertebral artery originates from the basilar tip. The left vertebral artery is predominantly from the posterior communicating artery with contribution from a left P1 segment. There is some attenuation of distal PCA branch vessels. Venous sinuses: The dural sinuses are patent. The right transverse sinus is dominant. The straight sinus and deep cerebral veins are intact. The cortical veins are intact. Anatomic variants: Fetal type left posterior cerebral artery with contribution from the P1 segment. Delayed phase: The postcontrast images demonstrate no pathologic enhancement. IMPRESSION: 1. Atherosclerotic changes with predominantly noncalcified plaque at the aortic arch, the distal left common carotid artery, both carotid bifurcations, and the cavernous internal carotid arteries. There are no significant focal stenoses. 2. Mild distal small vessel intracranial atherosclerotic changes in both the anterior and posterior circulation. 3. No significant proximal stenosis, aneurysm, or branch vessel occlusion within the circle of Willis. 4. Endplate degenerative changes and uncovertebral spurring at C3-4 and C6-7. Electronically Signed   By: San Morelle [name]   On: 03/10/2016 08:00   Dg Chest 1 View  03/13/2016  CLINICAL DATA:  70 year old male with a history of hypotension and vomiting EXAM: CHEST 1 VIEW  COMPARISON:  11/25/2015, 09/15/2015 FINDINGS: Cardiomediastinal silhouette unchanged in size and  contour. Surgical changes of median sternotomy and CABG. Compared to prior there is interlobular septal thickening and interstitial opacity. No pneumothorax. No confluent airspace disease. No displaced fracture. IMPRESSION: Evidence of early edema, less likely atypical infection. Surgical changes of median sternotomy and CABG. Signed, Dulcy Fanny. Earleen Newport, DO [facility] Electronically Signed   By: Corrie Mckusick D.O.   On: 03/13/2016 13:06   Ct Head Wo Contrast  03/13/2016  CLINICAL DATA:  Sudden onset of dizziness and vomiting this morning. Recent TIA. Weakness. EXAM: CT HEAD WITHOUT CONTRAST TECHNIQUE: Contiguous axial images were obtained from the base of the skull through the vertex without intravenous contrast. COMPARISON:  Brain MRI, 03/10/2016.  Head CT, 03/28/2015. FINDINGS: The ventricles are normal in configuration. There is ventricular and sulcal enlargement reflecting age related volume loss. No hydrocephalus. There are no parenchymal masses or mass effect. There is no evidence of a cortical infarct. There are no extra-axial masses or abnormal fluid collections. There is no intracranial hemorrhage. Visualized sinuses and mastoid air cells are clear. No skull lesion. IMPRESSION: 1. No acute intracranial abnormalities. 2. Age related volume loss. Electronically Signed   By: Lajean Manes [name]   On: 03/13/2016 13:10   Ct Angio Neck W/cm &/or Wo/cm  03/10/2016  CLINICAL DATA:  Presyncopal episode. Confusion versus seizure. The patient develops slurred speech during these episodes. EXAM: CT ANGIOGRAPHY HEAD AND NECK TECHNIQUE: Multidetector CT imaging of the head and neck was performed using the standard protocol during bolus administration of intravenous contrast. Multiplanar CT image reconstructions and MIPs were obtained to evaluate the vascular anatomy.  Carotid stenosis measurements (when applicable) are obtained utilizing NASCET criteria, using the distal internal carotid diameter as the denominator. CONTRAST:  50 mL Isovue 370 COMPARISON:  MRI brain 01/15/2016. FINDINGS: CT HEAD Brain: Mild periventricular and subcortical white matter changes are stable. No acute cortical infarct, hemorrhage, or mass lesion is present. The ventricles are of normal size. The basal ganglia are intact. The insular ribbon is normal bilaterally. Calvarium and skull base: Within normal limits. Paranasal sinuses: Clear Orbits: Unremarkable CTA NECK Aortic arch: A 3 vessel arch configuration is present. Mural thickening is present at the aortic arch without focal stenosis or aneurysm. Right carotid system: The right common carotid artery is within normal limits. There is some atherosclerotic irregularity at the bifurcation without a significant stenosis. The cervical right ICA is normal. Left carotid system: Wall thickening is evident along the anteromedial border of the distal left common carotid artery. Mild atherosclerotic changes are noted at the carotid bifurcation. The cervical left ICA is otherwise normal. Vertebral arteries:The vertebral arteries both originate from the subclavian arteries. Right vertebral artery is the dominant vessel. No focal stenosis or vascular injury is evident in either vertebral artery within the neck. Skeleton: Endplate degenerative changes and uncovertebral spurring are most evident at C3-4 and C6-7. There is a osseous foraminal narrowing at both of these levels. The bone windows are otherwise unremarkable. There is slight anterior translation of the right mandibular condyle compared to the left. No focal lytic or blastic lesions are present. Other neck: No focal mucosal or submucosal lesions are present. The vocal cords are midline and symmetric. The tongue base is unremarkable. No significant cervical adenopathy is present. CTA HEAD Anterior  circulation: Mild atherosclerotic irregularity in calcifications are present within the cavernous internal carotid arteries bilaterally. The ICA termini are intact. The right A1 segment is dominant. The A1 and M1 segments are intact. The  MCA bifurcations are within normal limits bilaterally. There is some attenuation of distal MCA and ACA branch vessels bilaterally. No focal branch vessel occlusion is evident. The anterior communicating artery is patent. Posterior circulation: Atherosclerotic calcifications are evident at the dural margin of both vertebral arteries. There is moderate stenosis on the left. PICA origins are visualized and normal. The vertebrobasilar junction is intact. The basilar artery is normal. The right vertebral artery originates from the basilar tip. The left vertebral artery is predominantly from the posterior communicating artery with contribution from a left P1 segment. There is some attenuation of distal PCA branch vessels. Venous sinuses: The dural sinuses are patent. The right transverse sinus is dominant. The straight sinus and deep cerebral veins are intact. The cortical veins are intact. Anatomic variants: Fetal type left posterior cerebral artery with contribution from the P1 segment. Delayed phase: The postcontrast images demonstrate no pathologic enhancement. IMPRESSION: 1. Atherosclerotic changes with predominantly noncalcified plaque at the aortic arch, the distal left common carotid artery, both carotid bifurcations, and the cavernous internal carotid arteries. There are no significant focal stenoses. 2. Mild distal small vessel intracranial atherosclerotic changes in both the anterior and posterior circulation. 3. No significant proximal stenosis, aneurysm, or branch vessel occlusion within the circle of Willis. 4. Endplate degenerative changes and uncovertebral spurring at C3-4 and C6-7. Electronically Signed   By: San Morelle [name]   On: 03/10/2016 08:00   Mr Jeri Cos  [id number] Contrast  03/10/2016  CLINICAL DATA:  Presyncope EXAM: MRI HEAD WITHOUT AND WITH CONTRAST TECHNIQUE: Multiplanar, multiecho pulse sequences of the brain and surrounding structures were obtained without and with intravenous contrast. CONTRAST:  31mL MULTIHANCE GADOBENATE DIMEGLUMINE 529 MG/ML IV SOLN COMPARISON:  01/15/2016 FINDINGS: Calvarium and upper cervical spine: No focal marrow signal abnormality. Orbits: Negative. Sinuses and Mastoids: Clear. Brain: No acute abnormality such as acute infarct, hemorrhage, hydrocephalus, or mass lesion. No evidence of large vessel occlusion. Patchy FLAIR hyperintensity in the cerebral white matter compatible with chronic vessel disease, mild for age. IMPRESSION: 1. No acute finding or explanation for the history. 2. Mild chronic small vessel disease. Electronically Signed   By: Monte Fantasia [name]   On: 03/10/2016 08:49    Oren Binet, MD  [facility] Pager:[phone]  If 7PM-7AM, please contact night-coverage [email or web address] Password [email or web address] 03/15/2016, 9:34 AM   LOS: 2 days

## 2016-03-15 NOTE — Consult Note (Signed)
Reason for Consult: Possible seizure activity Referring Physician: Dr. Sloan Leiter  CC: Possible seizure activity  HPI: [name] [name] is a 70 y.o. male with a history of diabetes mellitus, obstructive sleep apnea, hyperlipidemia, right bundle branch block, bipolar disorder, hypertension, recurrent falls, coronary artery disease with previous coronary artery bypass graft surgery, palpitations, history of syncope, and a history of noncompliance with medications, admitted to [facility] 03/13/2016 with presyncope and shortness of breath. The patient had been admitted to [facility] 2 days prior to that with similar symptoms. A chest x-ray was consistent with pulmonary vascular congestion versus pneumonia.  Further history reveals a previous history of head trauma and concussions with previous evaluations for lightheadedness, confusion, slurred speech, right facial droop, and hand tremors which apparently lasted up to 30 minutes. An EEG performed 03/10/2016 was normal. An overnight EEG with video to be performed 03/15/2016 is pending. An MRI of the brain on 03/10/2016 showed no acute findings but mild chronic small vessel disease. A CT of the head performed 03/13/2016 showed no acute intracranial abnormalities with age-related volume loss. CTA of the head and neck were unremarkable. Neurology was asked to assist with the patient's evaluation.  Previous neurology consult for 03/10/2016 -  Dr. Cristobal Goldmann. Neurology signed off on 03/10/2016. No focal neurological deficits were noted. The patient was placed on baclofen for chronic neck pain and neck spasms.  The patient has also been followed by neurology at [facility] for a history of multiple concussions related to baseball and football injuries. He has had associated memory problems and occasionally while driving he gets lost in familiar areas. His wife reports that often while riding in the car or even walking the patient becomes dizzy, developes  slurred speech, becomes confused, and then becomes lethargic. He often falls asleep and has no memory of the events after he awakens. There is a question of syncope associated with these episodes. His wife reports that his blood pressure tends to run low - with a systolic blood pressure approximately 90 most of the time.  Past Medical History  Diagnosis Date  . Diabetes mellitus type II, uncontrolled ([place])   . OSA (obstructive sleep apnea)   . Hyperlipidemia   . RBBB (right bundle branch block)   . Depression     Bipolar  . Edema   . Hypertension   . Falling episodes     these have occurred in the past and again recurring 2011  . Spondylosis     C5-6, C6-7 MRI 2010  . CAD (coronary artery disease)     A. CABG in 2000,status post cardiac cath in 2006, 2009 ....continued chest pain and SOB despite oral medication adjestments including Ranexa. B. Cath November 2009/ mRCA - 2.75 x 23 Abbott Xience V drug-eluting stent ...11/26/2008 to distal  RCA leading to acute marginal.  C. Cath 07/2012 for CP - stable anatomy, med rx. d. cath 2015 and 05/30/2015 stable anatomy, consider Myoview if has CP again  . Hx of CABG     2000,  / one median sternotomy suture broken her chest x-ray November, 2010, no clinical significance  . Palpitations     event recorder showed sinus rhythm  . Nephrolithiasis   . Restless leg   . Dizziness   . Shortness of breath     CPX April, 2011, mild functional limitation, no clear pulmonary or cardiac limitation, possible deconditioning and mild chronotropic incompetence( peak heart rate 130)  . Cerebral ischemia     MRI  November, 2010, chronic microvascular ischemia  . Anemia     hemoglobin 7.4, iron deficiency, January, 2011, 2 unit transfusion, endoscopy normal, capsule endoscopy February, 2011 no small bowel abnormalities.   Most likely source gastric erosions, followed by GI  . H/O medication noncompliance     Due to loss of insurance  . Ejection fraction     EF  60%, echo, July 31, 2012  . Diabetes mellitus without complication ([place])   . Pain in limb 06/12/2009    Qualifier: Diagnosis of  By: [name]   . Low back pain 06/12/2009    Qualifier: Diagnosis of  By: [name]   . Thyroid disease   . Right knee pain 01/07/2015  . Advance care planning 05/01/2015  . ACP (advance care planning) 05/11/2015  . Nausea with vomiting 01/25/2016  . Headache 01/25/2016  . Diarrhea 02/15/2016  . Gastric ulcer   . Tubulovillous adenoma of colon 2007  . Iron deficiency anemia   . Syncope 03/2016  . Chronic diastolic CHF (congestive heart failure) ([place]) 03/13/2016    Past Surgical History  Procedure Laterality Date  . Nasal septum surgery      UP3  . Coronary artery bypass graft      2000  . Left heart catheterization with coronary/graft angiogram N/A 08/01/2012    Procedure: LEFT HEART CATHETERIZATION WITH Beatrix Fetters;  Surgeon: [name], MD;  Location: [facility] CATH LAB;  Service: Cardiovascular;  Laterality: N/A;  . Left heart catheterization with coronary/graft angiogram N/A 01/03/2015    Procedure: LEFT HEART CATHETERIZATION WITH Beatrix Fetters;  Surgeon: [name], MD;  Location: [facility] CATH LAB;  Service: Cardiovascular;  Laterality: N/A;  . Percutaneous coronary stent intervention (pci-s)  10/2008    mRCA PCI  2.75 x 23 Abbott Xience V drug-eluting stent   . Cardiac catheterization N/A 05/30/2015    Procedure: Left Heart Cath and Coronary Angiography;  Surgeon: [name], MD;  Location: [place] CV LAB;  Service: Cardiovascular;  Laterality: N/A;    Family History  Problem Relation Age of Onset  . Cancer Brother     pancreatic cancer  . Diabetes Brother     4   . Stroke Brother   . Diabetes Brother   . Cancer Brother   . Diabetes Brother   . Coronary artery disease Brother   . Diabetes Brother   . Hypothyroidism Brother   . Heart attack      Nephew  . Diabetes Mother   . Heart failure Mother   .  Heart failure Father   . Irregular heart beat Daughter   . Cancer Maternal Grandmother     unknown     Social History:  reports that he has never smoked. He has never used smokeless tobacco. He reports that he does not drink alcohol or use illicit drugs.  Allergies  Allergen Reactions  . Morphine Other (See Comments)    hallucinations  . Shellfish-Derived Products Itching    Medications:  Scheduled: . atorvastatin  40 mg Oral q1800  . baclofen  5 mg Oral QHS  . clopidogrel  75 mg Oral Daily  . clotrimazole-betamethasone  1 application Topical BID  . gabapentin  300 mg Oral Q1200  . gabapentin  600 mg Oral BID  . heparin  5,000 Units Subcutaneous 3 times per day  . insulin aspart  0-15 Units Subcutaneous TID WC  . insulin aspart  4 Units Subcutaneous TID WC  .  insulin glargine  30 Units Subcutaneous QHS  . levothyroxine  75 mcg Oral QAC breakfast  . pantoprazole  40 mg Oral Daily  . pramipexole  1 mg Oral QHS  . sertraline  100 mg Oral BID  . topiramate  50 mg Oral QHS    ROS: History obtained from the patient and wife. The review of systems was negative except for as listed in the history of present illness. General ROS: negative for - chills, fatigue, fever, night sweats, weight gain or weight loss Psychological ROS: negative for - behavioral disorder, hallucinations, memory difficulties, mood swings or suicidal ideation Ophthalmic ROS: negative for - blurry vision, double vision, eye pain or loss of vision ENT ROS: negative for - epistaxis, nasal discharge, oral lesions, sore throat, tinnitus or vertigo Allergy and Immunology ROS: negative for - hives or itchy/watery eyes Hematological and Lymphatic ROS: negative for - bleeding problems, bruising or swollen lymph nodes Endocrine ROS: negative for - galactorrhea, hair pattern changes, polydipsia/polyuria or temperature intolerance Respiratory ROS: negative for - cough, hemoptysis, shortness of breath or  wheezing Cardiovascular ROS: negative for - chest pain, dyspnea on exertion, edema or irregular heartbeat Gastrointestinal ROS: negative for - abdominal pain, diarrhea, hematemesis, nausea/vomiting or stool incontinence Genito-Urinary ROS: negative for - dysuria, hematuria, incontinence or urinary frequency/urgency Musculoskeletal ROS: negative for - joint swelling or muscular weakness Neurological ROS: as noted in HPI Dermatological ROS: negative for rash and skin lesion changes   Physical Examination: Blood pressure 141/63, pulse 64, temperature 97.6 F (36.4 C), temperature source Oral, resp. rate 18, height 5\' 9"  (1.753 m), weight 78.472 kg (173 lb), SpO2 96 %.  General - pleasant alert 70 year old male in no acute distress Heart - Regular rate and rhythm  Lungs - Clear to auscultation Abdomen - Soft - non tender Extremities - Distal pulses intact - no edema Skin - Warm and dry   Neurologic Examination Mental Status: Alert, oriented, thought content appropriate.  Speech fluent without evidence of aphasia.  Able to follow 3 step commands without difficulty. Cranial Nerves: II: Discs not visualized; Visual fields grossly normal, pupils equal, round, reactive to light and accommodation III,IV, VI: ptosis not present, extra-ocular motions intact bilaterally V,VII: smile symmetric, facial light touch sensation normal bilaterally VIII: hearing normal bilaterally IX,X: gag reflex present XI: bilateral shoulder shrug XII: midline tongue extension Motor: Right : Upper extremity   5/5 Grip - 4/5   Left:     Upper extremity   5/5    Grip - 4/5  Lower extremity   5/5     Lower extremity   5/5 Tone and bulk:normal tone throughout; no atrophy noted Sensory: Light touch intact throughout, bilaterally Deep Tendon Reflexes: 2+ and symmetric throughout Plantars: Right: downgoing   Left: downgoing Cerebellar: normal finger-to-nose, normal rapid alternating movements and normal heel-to-shin  test Gait: Deferred   Laboratory Studies:   Basic Metabolic Panel:  Recent Labs Lab 03/09/16 2036 03/10/16 0550 03/11/16 0344 03/13/16 1155 03/14/16 0318  NA 134* 142 140 136 140  K 4.4 3.4* 3.9 3.9 3.9  CL 105 108 107 108 107  CO2 17* 24 22 21* 22  GLUCOSE 445* 86 209* 97 225*  BUN 15 12 10 17 13   CREATININE 1.25* 1.05 0.99 1.56* 1.20  CALCIUM 8.6* 8.7* 9.3 9.4 9.3  MG  --  1.8  --   --   --   PHOS  --  4.0  --   --   --  Liver Function Tests:  Recent Labs Lab 03/08/16 1728 03/09/16 2036 03/10/16 0550 03/13/16 1155  AST 26 23 20 27   ALT 34 29 26 28   ALKPHOS 82 89 61 65  BILITOT 0.7 0.8 0.7 0.9  PROT 7.2 5.7* 5.7* 7.0  ALBUMIN 4.3 3.5 3.4* 4.3    Recent Labs Lab 03/13/16 1155  LIPASE 11   No results for input(s): AMMONIA in the last 168 hours.  CBC:  Recent Labs Lab 03/08/16 1728 03/09/16 2036 03/10/16 0550 03/11/16 0344 03/13/16 1155 03/14/16 0318  WBC 6.6 8.0 6.1 6.6 11.4* 8.1  NEUTROABS 4.0 5.2 3.4  --  6.7  --   HGB 13.5 12.2* 11.8* 12.6* 13.4 12.6*  HCT 38.3* 34.0* 34.3* 36.5* 37.6* 35.7*  MCV 89.5 88.3 87.5 88.0 88.1 87.1  PLT 148* 144* 138* 153 252 167    Cardiac Enzymes:  Recent Labs Lab 03/08/16 1728 03/09/16 2036 03/13/16 1155  TROPONINI <0.03 <0.03 <0.03    BNP: Invalid input(s): POCBNP  CBG:  Recent Labs Lab 03/14/16 0648 03/14/16 1141 03/14/16 1627 03/14/16 2050 03/15/16 0612  GLUCAP 174* 147* 126* 318* 191*    Microbiology: Results for orders placed or performed during the hospital encounter of 03/13/16  Culture, blood (routine x 2)     Status: None (Preliminary result)   Collection Time: 03/13/16 12:50 PM  Result Value Ref Range Status   Specimen Description BLOOD LEFT AC  Final   Special Requests BOTTLES DRAWN AEROBIC AND ANAEROBIC 5ML  Final   Culture   Final    NO GROWTH < 24 HOURS Performed at [facility]    Report Status PENDING  Incomplete  Culture, blood (routine x 2)     Status:  None (Preliminary result)   Collection Time: 03/13/16 12:50 PM  Result Value Ref Range Status   Specimen Description BLOOD LEFT HAND  Final   Special Requests BOTTLES DRAWN AEROBIC AND ANAEROBIC 2ML  Final   Culture   Final    NO GROWTH < 24 HOURS Performed at [facility]    Report Status PENDING  Incomplete  MRSA PCR Screening     Status: None   Collection Time: 03/13/16  6:44 PM  Result Value Ref Range Status   MRSA by PCR NEGATIVE NEGATIVE Final    Comment:        The GeneXpert MRSA Assay (FDA approved for NASAL specimens only), is one component of a comprehensive MRSA colonization surveillance program. It is not intended to diagnose MRSA infection nor to guide or monitor treatment for MRSA infections.   Urine culture     Status: Abnormal   Collection Time: 03/13/16  8:49 PM  Result Value Ref Range Status   Specimen Description URINE, CLEAN CATCH  Final   Special Requests NONE  Final   Culture 1,000 COLONIES/mL INSIGNIFICANT GROWTH (A)  Final   Report Status 03/15/2016 FINAL  Final    Coagulation Studies: No results for input(s): LABPROT, INR in the last 72 hours.  Urinalysis:  Recent Labs Lab 03/08/16 1705 03/13/16 2049  COLORURINE YELLOW YELLOW  LABSPEC 1.018 1.011  PHURINE 6.0 5.0  GLUCOSEU >1000* 100*  HGBUR NEGATIVE NEGATIVE  BILIRUBINUR NEGATIVE NEGATIVE  KETONESUR NEGATIVE NEGATIVE  PROTEINUR NEGATIVE NEGATIVE  NITRITE NEGATIVE NEGATIVE  LEUKOCYTESUR NEGATIVE NEGATIVE    Lipid Panel:     Component Value Date/Time   CHOL 75 02/09/2016 1656   TRIG 244.0* 02/09/2016 1656   HDL 27.20* 02/09/2016 1656   CHOLHDL 3 02/09/2016  1656   VLDL 48.8* 02/09/2016 1656   LDLCALC 107* 05/01/2015 1136    HgbA1C:  Lab Results  Component Value Date   HGBA1C 10.0* 03/10/2016    Urine Drug Screen:  No results found for: LABOPIA, COCAINSCRNUR, LABBENZ, AMPHETMU, THCU, LABBARB  Alcohol Level: No results for input(s): ETH in the last 168 hours.  Other  results: EKG: Sinus bradycardia rate 58 bpm. New right bundle branch block. Please see formal cardiology reading for complete details.  Imaging:   Dg Chest 1 View 03/13/2016   Evidence of early edema, less likely atypical infection. Surgical changes of median sternotomy and CABG.    Ct Head Wo Contrast 03/13/2016   1. No acute intracranial abnormalities.  2. Age related volume loss.    MRI of the brain with and without contrast 03/10/2016 1. No acute finding or explanation for the history. 2. Mild chronic small vessel disease.  CT angiogram of the head and neck 03/10/2016 1. Atherosclerotic changes with predominantly noncalcified plaque at the aortic arch, the distal left common carotid artery, both carotid bifurcations, and the cavernous internal carotid arteries. There are no significant focal stenoses. 2. Mild distal small vessel intracranial atherosclerotic changes in both the anterior and posterior circulation. 3. No significant proximal stenosis, aneurysm, or branch vessel occlusion within the circle of Willis. 4. Endplate degenerative changes and uncovertebral spurring at C3-4 and C6-7.    Assessment/Plan: Pleasant 70 year old male with a history of multiple sports related concussions, now with memory deficits and "spells" which may represent seizure activity. Discussed with Dr. Leonel Ramsay. Await prolonged EEG planned for this evening.   Mikey Bussing PA-C [facility] Pager [phone] 03/15/2016, 69:13 PM   70 year old male with description of the events that sound like presyncope, however given the presence of events while seated and abnormal character, agree with prolonged EEG monitoring. Given the positional nature of the events, ambulatory EEG monitoring might be needed if he is unable to have an event by tomorrow, would refer to outpatient neurology for consideration of ambulatory EEG.  Roland Rack, MD [facility] [phone]  If 7pm- 7am, please page neurology on call as listed in [place].

## 2016-03-15 NOTE — Consult Note (Signed)
   [facility] CM Inpatient Consult   03/15/2016  [name] [name] [birth year] [id number] Patient discussed in progression meeting and was a re-admission back to the hospital.  70 yo male with PMHx of DM,CAD-Hx of CABG. DC'd 03/09/16.  Met with patient and his wife, Santiago Glad,  regarding [place] management services. Patient evaluated for community based chronic disease management services with [place] [facility] as a benefit of patient's [employer]. Consent form signed.    Patient will receive post hospital discharge call and will be evaluated for monthly home visits for assessments and disease process education.  Left contact information and [facility] literature at bedside. Made Inpatient Case Manager aware that [place] Management following. Of note, [facility] Care Management services does not replace or interfere with any services that are arranged by inpatient case management or social work.  For additional questions or referrals please contact:   Natividad Brood, RN BSN [facility] Liaison  [phone] business mobile phone Toll free office [phone]

## 2016-03-16 ENCOUNTER — Institutional Professional Consult (permissible substitution): Payer: Commercial Managed Care - HMO | Admitting: Pulmonary Disease

## 2016-03-16 ENCOUNTER — Other Ambulatory Visit: Payer: Self-pay | Admitting: Student

## 2016-03-16 ENCOUNTER — Other Ambulatory Visit: Payer: Self-pay

## 2016-03-16 DIAGNOSIS — I951 Orthostatic hypotension: Secondary | ICD-10-CM

## 2016-03-16 DIAGNOSIS — R55 Syncope and collapse: Secondary | ICD-10-CM | POA: Insufficient documentation

## 2016-03-16 LAB — GLUCOSE, CAPILLARY
Glucose-Capillary: 272 mg/dL — ABNORMAL HIGH (ref 65–99)
Glucose-Capillary: 244 mg/dL — ABNORMAL HIGH (ref 65–99)
Glucose-Capillary: 206 mg/dL — ABNORMAL HIGH (ref 65–99)

## 2016-03-16 NOTE — Discharge Summary (Signed)
Physician Discharge Summary  [name] [name] MRN: [id number] DOB/AGE: 1946-07-05 70 y.o.  PCP: Penni Homans, MD   Admit date: 03/13/2016 Discharge date: 03/16/2016  Discharge Diagnoses:     Principal Problem:   Hypotension Active Problems:   Essential hypertension   Diabetes mellitus, type 2 (HCC)   HCAP (healthcare-associated pneumonia)   Chronic diastolic CHF (congestive heart failure) (HCC) Sinus bradycardia    Follow-up recommendations Follow-up with PCP in 3-5 days , including all  additional recommended appointments as below Follow-up CBC, CMP in 3-5 days Metoprolol discontinued after consultation with [name] Patient to be set up with a Holter monitor for further evaluation of his dizziness Cardiology  appointment in the next 7-10 days Outpatient follow-up with neurology for ambulatory EEG    Medication List    STOP taking these medications        metoprolol tartrate 25 MG tablet  Commonly known as:  LOPRESSOR      TAKE these medications        atorvastatin 40 MG tablet  Commonly known as:  LIPITOR  Take 1 tablet (40 mg total) by mouth daily at 6 PM.     baclofen 10 MG tablet  Commonly known as:  LIORESAL  Take 0.5 tablets (5 mg total) by mouth at bedtime.     Cholecalciferol 50000 units Tabs  Commonly known as:  DIALYVITE VITAMIN D3 MAX  Take 1 tablet by mouth once a week.     clopidogrel 75 MG tablet  Commonly known as:  PLAVIX  Take 1 tablet (75 mg total) by mouth daily.     clotrimazole-betamethasone cream  Commonly known as:  LOTRISONE  APPLY TO THE AFFECTED TWICE DAILY     diclofenac sodium 1 % Gel  Commonly known as:  VOLTAREN  Apply 2 g topically 3 (three) times daily as needed. For pain to affected area     gabapentin 300 MG capsule  Commonly known as:  NEURONTIN  2 capsules in AM, 1 capsule at noon, 2 capsules in PM daily     glucose blood test strip  Commonly known as:  ACCU-CHEK AVIVA  Use as directed to check blood sugar  4 times daily.  DX E11.9     hyoscyamine 0.125 MG Tbdp disintergrating tablet  Commonly known as:  ANASPAZ  Place 1 tablet (0.125 mg total) under the tongue every 4 (four) hours as needed for cramping.     insulin aspart 100 UNIT/ML injection  Commonly known as:  novoLOG  Inject 10-17 Units into the skin 3 (three) times daily with meals. Use 10-17 unit 3 times daily before meals     Insulin Glargine 300 UNIT/ML Sopn  Commonly known as:  TOUJEO SOLOSTAR  Inject 42 Units into the skin at bedtime.     isosorbide mononitrate 120 MG 24 hr tablet  Commonly known as:  IMDUR  Take 1 tablet (120 mg total) by mouth daily.     levothyroxine 75 MCG tablet  Commonly known as:  SYNTHROID, LEVOTHROID  Take 1 tablet (75 mcg total) by mouth daily.     metFORMIN 1000 MG tablet  Commonly known as:  GLUCOPHAGE  Take 1 tablet (1,000 mg total) by mouth 2 (two) times daily with a meal.     nitroGLYCERIN 0.4 MG SL tablet  Commonly known as:  NITROSTAT  Place 1 tablet (0.4 mg total) under the tongue every 5 (five) minutes as needed (up to 3 doses).     ondansetron 4 MG  disintegrating tablet  Commonly known as:  ZOFRAN-ODT  Take 1 tablet (4 mg total) by mouth every 8 (eight) hours as needed for nausea or vomiting.     pantoprazole 40 MG tablet  Commonly known as:  PROTONIX  Take 40 mg by mouth daily.     pramipexole 0.5 MG tablet  Commonly known as:  MIRAPEX  Take 1-2 tablets (0.5-1 mg total) by mouth at bedtime.     pramipexole 1 MG tablet  Commonly known as:  MIRAPEX  Take 1 mg by mouth at bedtime.     ranolazine 500 MG 12 hr tablet  Commonly known as:  RANEXA  Take 500 mg by mouth 2 (two) times daily.     sertraline 100 MG tablet  Commonly known as:  ZOLOFT  Take 1 tablet (100 mg total) by mouth 2 (two) times daily.     tiZANidine 4 MG tablet  Commonly known as:  ZANAFLEX  Take 1 tablet (4 mg total) by mouth every 8 (eight) hours as needed for muscle spasms.     topiramate 50 MG  tablet  Commonly known as:  TOPAMAX  Take 1 tablet (50 mg total) by mouth at bedtime.         Discharge Condition: *Stable  Discharge Instructions Get Medicines reviewed and adjusted: Please take all your medications with you for your next visit with your Primary MD  Please request your Primary MD to go over all hospital tests and procedure/radiological results at the follow up, please ask your Primary MD to get all Hospital records sent to his/her office.  If you experience worsening of your admission symptoms, develop shortness of breath, life threatening emergency, suicidal or homicidal thoughts you must seek medical attention immediately by calling 911 or calling your MD immediately if symptoms less severe.  You must read complete instructions/literature along with all the possible adverse reactions/side effects for all the Medicines you take and that have been prescribed to you. Take any new Medicines after you have completely understood and accpet all the possible adverse reactions/side effects.   Do not drive when taking Pain medications.   Do not take more than prescribed Pain, Sleep and Anxiety Medications  Special Instructions: If you have smoked or chewed Tobacco in the last 2 yrs please stop smoking, stop any regular Alcohol and or any Recreational drug use.  Wear Seat belts while driving.  Please note  You were cared for by a hospitalist during your hospital stay. Once you are discharged, your primary care physician will handle any further medical issues. Please note that NO REFILLS for any discharge medications will be authorized once you are discharged, as it is imperative that you return to your primary care physician (or establish a relationship with a primary care physician if you do not have one) for your aftercare needs so that they can reassess your need for medications and monitor your lab values.      Discharge Instructions    AMB Referral to [place]  Management    Complete by:  As directed   Reason for consult:  post hospital follow up - recently discharged  Diagnoses of:  Diabetes  Expected date of contact:  1-3 days (reserved for hospital discharges)  Please assign to community nurse for transition of care calls and assess for home visits.  Re-admission, high risk.  Questions please call:   Natividad Brood, RN BSN [facility] Liaison  [phone] business mobile phone Toll free office [phone]  Diet - low sodium heart healthy    Complete by:  As directed      Increase activity slowly    Complete by:  As directed            Allergies  Allergen Reactions  . Morphine Other (See Comments)    hallucinations  . Shellfish-Derived Products Itching      Disposition: 01-Home or Self Care   Consults:  Neurology     Significant Diagnostic Studies:  Ct Angio Head W/cm &/or Wo Cm  03/10/2016  CLINICAL DATA:  Presyncopal episode. Confusion versus seizure. The patient develops slurred speech during these episodes. EXAM: CT ANGIOGRAPHY HEAD AND NECK TECHNIQUE: Multidetector CT imaging of the head and neck was performed using the standard protocol during bolus administration of intravenous contrast. Multiplanar CT image reconstructions and MIPs were obtained to evaluate the vascular anatomy. Carotid stenosis measurements (when applicable) are obtained utilizing NASCET criteria, using the distal internal carotid diameter as the denominator. CONTRAST:  50 mL Isovue 370 COMPARISON:  MRI brain 01/15/2016. FINDINGS: CT HEAD Brain: Mild periventricular and subcortical white matter changes are stable. No acute cortical infarct, hemorrhage, or mass lesion is present. The ventricles are of normal size. The basal ganglia are intact. The insular ribbon is normal bilaterally. Calvarium and skull base: Within normal limits. Paranasal sinuses: Clear Orbits: Unremarkable CTA NECK Aortic arch: A 3 vessel arch configuration is  present. Mural thickening is present at the aortic arch without focal stenosis or aneurysm. Right carotid system: The right common carotid artery is within normal limits. There is some atherosclerotic irregularity at the bifurcation without a significant stenosis. The cervical right ICA is normal. Left carotid system: Wall thickening is evident along the anteromedial border of the distal left common carotid artery. Mild atherosclerotic changes are noted at the carotid bifurcation. The cervical left ICA is otherwise normal. Vertebral arteries:The vertebral arteries both originate from the subclavian arteries. Right vertebral artery is the dominant vessel. No focal stenosis or vascular injury is evident in either vertebral artery within the neck. Skeleton: Endplate degenerative changes and uncovertebral spurring are most evident at C3-4 and C6-7. There is a osseous foraminal narrowing at both of these levels. The bone windows are otherwise unremarkable. There is slight anterior translation of the right mandibular condyle compared to the left. No focal lytic or blastic lesions are present. Other neck: No focal mucosal or submucosal lesions are present. The vocal cords are midline and symmetric. The tongue base is unremarkable. No significant cervical adenopathy is present. CTA HEAD Anterior circulation: Mild atherosclerotic irregularity in calcifications are present within the cavernous internal carotid arteries bilaterally. The ICA termini are intact. The right A1 segment is dominant. The A1 and M1 segments are intact. The MCA bifurcations are within normal limits bilaterally. There is some attenuation of distal MCA and ACA branch vessels bilaterally. No focal branch vessel occlusion is evident. The anterior communicating artery is patent. Posterior circulation: Atherosclerotic calcifications are evident at the dural margin of both vertebral arteries. There is moderate stenosis on the left. PICA origins are visualized  and normal. The vertebrobasilar junction is intact. The basilar artery is normal. The right vertebral artery originates from the basilar tip. The left vertebral artery is predominantly from the posterior communicating artery with contribution from a left P1 segment. There is some attenuation of distal PCA branch vessels. Venous sinuses: The dural sinuses are patent. The right transverse sinus is dominant. The straight sinus and deep cerebral veins are intact. The cortical  veins are intact. Anatomic variants: Fetal type left posterior cerebral artery with contribution from the P1 segment. Delayed phase: The postcontrast images demonstrate no pathologic enhancement. IMPRESSION: 1. Atherosclerotic changes with predominantly noncalcified plaque at the aortic arch, the distal left common carotid artery, both carotid bifurcations, and the cavernous internal carotid arteries. There are no significant focal stenoses. 2. Mild distal small vessel intracranial atherosclerotic changes in both the anterior and posterior circulation. 3. No significant proximal stenosis, aneurysm, or branch vessel occlusion within the circle of Willis. 4. Endplate degenerative changes and uncovertebral spurring at C3-4 and C6-7. Electronically Signed   By: San Morelle [name]   On: 03/10/2016 08:00   Dg Chest 1 View  03/13/2016  CLINICAL DATA:  70 year old male with a history of hypotension and vomiting EXAM: CHEST 1 VIEW COMPARISON:  11/25/2015, 09/15/2015 FINDINGS: Cardiomediastinal silhouette unchanged in size and contour. Surgical changes of median sternotomy and CABG. Compared to prior there is interlobular septal thickening and interstitial opacity. No pneumothorax. No confluent airspace disease. No displaced fracture. IMPRESSION: Evidence of early edema, less likely atypical infection. Surgical changes of median sternotomy and CABG. Signed, Dulcy Fanny. Earleen Newport, DO [facility]  Electronically Signed   By: Corrie Mckusick D.O.   On: 03/13/2016 13:06   Ct Head Wo Contrast  03/13/2016  CLINICAL DATA:  Sudden onset of dizziness and vomiting this morning. Recent TIA. Weakness. EXAM: CT HEAD WITHOUT CONTRAST TECHNIQUE: Contiguous axial images were obtained from the base of the skull through the vertex without intravenous contrast. COMPARISON:  Brain MRI, 03/10/2016.  Head CT, 03/28/2015. FINDINGS: The ventricles are normal in configuration. There is ventricular and sulcal enlargement reflecting age related volume loss. No hydrocephalus. There are no parenchymal masses or mass effect. There is no evidence of a cortical infarct. There are no extra-axial masses or abnormal fluid collections. There is no intracranial hemorrhage. Visualized sinuses and mastoid air cells are clear. No skull lesion. IMPRESSION: 1. No acute intracranial abnormalities. 2. Age related volume loss. Electronically Signed   By: Lajean Manes [name]   On: 03/13/2016 13:10   Ct Angio Neck W/cm &/or Wo/cm  03/10/2016  CLINICAL DATA:  Presyncopal episode. Confusion versus seizure. The patient develops slurred speech during these episodes. EXAM: CT ANGIOGRAPHY HEAD AND NECK TECHNIQUE: Multidetector CT imaging of the head and neck was performed using the standard protocol during bolus administration of intravenous contrast. Multiplanar CT image reconstructions and MIPs were obtained to evaluate the vascular anatomy. Carotid stenosis measurements (when applicable) are obtained utilizing NASCET criteria, using the distal internal carotid diameter as the denominator. CONTRAST:  50 mL Isovue 370 COMPARISON:  MRI brain 01/15/2016. FINDINGS: CT HEAD Brain: Mild periventricular and subcortical white matter changes are stable. No acute cortical infarct, hemorrhage, or mass lesion is present. The ventricles are of normal size. The basal ganglia are intact. The insular ribbon is normal bilaterally. Calvarium and skull base: Within normal  limits. Paranasal sinuses: Clear Orbits: Unremarkable CTA NECK Aortic arch: A 3 vessel arch configuration is present. Mural thickening is present at the aortic arch without focal stenosis or aneurysm. Right carotid system: The right common carotid artery is within normal limits. There is some atherosclerotic irregularity at the bifurcation without a significant stenosis. The cervical right ICA is normal. Left carotid system: Wall thickening is evident along the anteromedial border of the distal left common carotid artery. Mild atherosclerotic changes are noted at the carotid bifurcation. The cervical left ICA is otherwise normal.  Vertebral arteries:The vertebral arteries both originate from the subclavian arteries. Right vertebral artery is the dominant vessel. No focal stenosis or vascular injury is evident in either vertebral artery within the neck. Skeleton: Endplate degenerative changes and uncovertebral spurring are most evident at C3-4 and C6-7. There is a osseous foraminal narrowing at both of these levels. The bone windows are otherwise unremarkable. There is slight anterior translation of the right mandibular condyle compared to the left. No focal lytic or blastic lesions are present. Other neck: No focal mucosal or submucosal lesions are present. The vocal cords are midline and symmetric. The tongue base is unremarkable. No significant cervical adenopathy is present. CTA HEAD Anterior circulation: Mild atherosclerotic irregularity in calcifications are present within the cavernous internal carotid arteries bilaterally. The ICA termini are intact. The right A1 segment is dominant. The A1 and M1 segments are intact. The MCA bifurcations are within normal limits bilaterally. There is some attenuation of distal MCA and ACA branch vessels bilaterally. No focal branch vessel occlusion is evident. The anterior communicating artery is patent. Posterior circulation: Atherosclerotic calcifications are evident at the  dural margin of both vertebral arteries. There is moderate stenosis on the left. PICA origins are visualized and normal. The vertebrobasilar junction is intact. The basilar artery is normal. The right vertebral artery originates from the basilar tip. The left vertebral artery is predominantly from the posterior communicating artery with contribution from a left P1 segment. There is some attenuation of distal PCA branch vessels. Venous sinuses: The dural sinuses are patent. The right transverse sinus is dominant. The straight sinus and deep cerebral veins are intact. The cortical veins are intact. Anatomic variants: Fetal type left posterior cerebral artery with contribution from the P1 segment. Delayed phase: The postcontrast images demonstrate no pathologic enhancement. IMPRESSION: 1. Atherosclerotic changes with predominantly noncalcified plaque at the aortic arch, the distal left common carotid artery, both carotid bifurcations, and the cavernous internal carotid arteries. There are no significant focal stenoses. 2. Mild distal small vessel intracranial atherosclerotic changes in both the anterior and posterior circulation. 3. No significant proximal stenosis, aneurysm, or branch vessel occlusion within the circle of Willis. 4. Endplate degenerative changes and uncovertebral spurring at C3-4 and C6-7. Electronically Signed   By: San Morelle [name]   On: 03/10/2016 08:00   Mr Jeri Cos [id number] Contrast  03/10/2016  CLINICAL DATA:  Presyncope EXAM: MRI HEAD WITHOUT AND WITH CONTRAST TECHNIQUE: Multiplanar, multiecho pulse sequences of the brain and surrounding structures were obtained without and with intravenous contrast. CONTRAST:  49mL MULTIHANCE GADOBENATE DIMEGLUMINE 529 MG/ML IV SOLN COMPARISON:  01/15/2016 FINDINGS: Calvarium and upper cervical spine: No focal marrow signal abnormality. Orbits: Negative. Sinuses and Mastoids: Clear. Brain: No acute abnormality such as acute infarct, hemorrhage,  hydrocephalus, or mass lesion. No evidence of large vessel occlusion. Patchy FLAIR hyperintensity in the cerebral white matter compatible with chronic vessel disease, mild for age. IMPRESSION: 1. No acute finding or explanation for the history. 2. Mild chronic small vessel disease. Electronically Signed   By: Monte Fantasia [name]   On: 03/10/2016 08:49     2-D echo LV EF: 55% - 60%  ------------------------------------------------------------------- Indications: Syncope 780.2.  ------------------------------------------------------------------- History: PMH: Coronary artery disease. Risk factors: Hypertension. Diabetes mellitus.  ------------------------------------------------------------------- Study Conclusions  - Left ventricle: The cavity size was normal. There was mild  concentric hypertrophy. Systolic function was normal. The  estimated ejection fraction was in the range of 55% to 60%. Wall  motion was normal; there were  no regional wall motion  abnormalities. There was an increased relative contribution of  atrial contraction to ventricular filling. Doppler parameters are  consistent with abnormal left ventricular relaxation (grade 1  diastolic dysfunction). - Mitral valve: There was mild regurgitation. - Left atrium: The atrium was mildly dilated.  Filed Weights   03/14/16 0616 03/15/16 0618 03/16/16 0455  Weight: 79.788 kg (175 lb 14.4 oz) 78.472 kg (173 lb) 77.792 kg (171 lb 8 oz)     Microbiology: Recent Results (from the past 240 hour(s))  Culture, blood (routine x 2)     Status: None (Preliminary result)   Collection Time: 03/13/16 12:50 PM  Result Value Ref Range Status   Specimen Description BLOOD LEFT AC  Final   Special Requests BOTTLES DRAWN AEROBIC AND ANAEROBIC 5ML  Final   Culture   Final    NO GROWTH 2 DAYS Performed at [facility]    Report Status PENDING  Incomplete  Culture, blood (routine x 2)     Status: None  (Preliminary result)   Collection Time: 03/13/16 12:50 PM  Result Value Ref Range Status   Specimen Description BLOOD LEFT HAND  Final   Special Requests BOTTLES DRAWN AEROBIC AND ANAEROBIC 2ML  Final   Culture   Final    NO GROWTH 2 DAYS Performed at [facility]    Report Status PENDING  Incomplete  MRSA PCR Screening     Status: None   Collection Time: 03/13/16  6:44 PM  Result Value Ref Range Status   MRSA by PCR NEGATIVE NEGATIVE Final    Comment:        The GeneXpert MRSA Assay (FDA approved for NASAL specimens only), is one component of a comprehensive MRSA colonization surveillance program. It is not intended to diagnose MRSA infection nor to guide or monitor treatment for MRSA infections.   Urine culture     Status: Abnormal   Collection Time: 03/13/16  8:49 PM  Result Value Ref Range Status   Specimen Description URINE, CLEAN CATCH  Final   Special Requests NONE  Final   Culture 1,000 COLONIES/mL INSIGNIFICANT GROWTH (A)  Final   Report Status 03/15/2016 FINAL  Final       Blood Culture    Component Value Date/Time   SDES URINE, CLEAN CATCH 03/13/2016 2049   SPECREQUEST NONE 03/13/2016 2049   CULT 1,000 COLONIES/mL INSIGNIFICANT GROWTH* 03/13/2016 2049   REPTSTATUS 03/15/2016 FINAL 03/13/2016 2049      Labs: Results for orders placed or performed during the hospital encounter of 03/13/16 (from the past 48 hour(s))  Glucose, capillary     Status: Abnormal   Collection Time: 03/14/16  4:27 PM  Result Value Ref Range   Glucose-Capillary 126 (H) 65 - 99 mg/dL   Comment 1 Notify RN   Glucose, capillary     Status: Abnormal   Collection Time: 03/14/16  8:50 PM  Result Value Ref Range   Glucose-Capillary 318 (H) 65 - 99 mg/dL   Comment 1 Notify RN    Comment 2 Document in Chart   Cortisol     Status: None   Collection Time: 03/15/16  4:17 AM  Result Value Ref Range   Cortisol, Plasma 11.4 ug/dL    Comment: (NOTE) AM    6.7 - 22.6 ug/dL PM    <10.0       ug/dL   Glucose, capillary     Status: Abnormal   Collection Time: 03/15/16  6:12 AM  Result  Value Ref Range   Glucose-Capillary 191 (H) 65 - 99 mg/dL   Comment 1 Notify RN    Comment 2 Document in Chart   Glucose, capillary     Status: Abnormal   Collection Time: 03/15/16 11:17 AM  Result Value Ref Range   Glucose-Capillary 188 (H) 65 - 99 mg/dL   Comment 1 Notify RN   Glucose, capillary     Status: Abnormal   Collection Time: 03/15/16  4:45 PM  Result Value Ref Range   Glucose-Capillary 280 (H) 65 - 99 mg/dL   Comment 1 Notify RN   Glucose, capillary     Status: Abnormal   Collection Time: 03/15/16  9:15 PM  Result Value Ref Range   Glucose-Capillary 348 (H) 65 - 99 mg/dL  Glucose, capillary     Status: Abnormal   Collection Time: 03/16/16  5:44 AM  Result Value Ref Range   Glucose-Capillary 272 (H) 65 - 99 mg/dL  Glucose, capillary     Status: Abnormal   Collection Time: 03/16/16 11:48 AM  Result Value Ref Range   Glucose-Capillary 244 (H) 65 - 99 mg/dL   Comment 1 Notify RN      Lipid Panel     Component Value Date/Time   CHOL 75 02/09/2016 1656   TRIG 244.0* 02/09/2016 1656   HDL 27.20* 02/09/2016 1656   CHOLHDL 3 02/09/2016 1656   VLDL 48.8* 02/09/2016 1656   LDLCALC 107* 05/01/2015 1136   LDLDIRECT 25.0 02/09/2016 1656     Lab Results  Component Value Date   HGBA1C 10.0* 03/10/2016   HGBA1C 9.3* 02/09/2016   HGBA1C 10.7* 05/29/2015     Lab Results  Component Value Date   MICROALBUR 1.1 05/01/2015   LDLCALC 107* 05/01/2015   CREATININE 1.20 03/14/2016     HPI : [name] [name] is a 70 y.o. male with a history of diabetes mellitus, obstructive sleep apnea, hyperlipidemia, right bundle branch block, bipolar disorder, hypertension, recurrent falls, coronary artery disease with previous coronary artery bypass graft surgery, palpitations, history of syncope, and a history of noncompliance with medications, admitted to [facility]  03/13/2016 with presyncope and shortness of breath. brought to the hospital (2nd admit) for episodes of lightheadedness/confusion/slurred speech/right facial droop and left hand tremors that last for approximately 30 minutes, and then has a period of confusion that last for around 15 minutes. These episodes have been getting worse for the past 2-3 weeks. . A chest x-ray was consistent with pulmonary vascular congestion versus pneumonia.  Further history reveals a previous history of head trauma and concussions with previous evaluations for lightheadedness, confusion, slurred speech, right facial droop, and hand tremors which apparently lasted up to 30 minutes. An EEG performed 03/10/2016 was normal. An overnight EEG with video to be performed 03/15/2016 is pending. An MRI of the brain on 03/10/2016 showed no acute findings but mild chronic small vessel disease. A CT of the head performed 03/13/2016 showed no acute intracranial abnormalities with age-related volume loss. CTA of the head and neck were unremarkable. Neurology was asked to assist with the patient's evaluation.    The patient has also been followed by neurology at [facility] for a history of multiple concussions related to baseball and football injuries. He has had associated memory problems and occasionally while driving he gets lost in familiar areas. His wife reports that often while riding in the car or even walking the patient becomes dizzy, developes slurred speech, becomes confused, and then becomes lethargic. He  often falls asleep and has no memory of the events after he awakens. There is a question of syncope associated with these episodes. His wife reports that his blood pressure tends to run low - with a systolic blood pressure approximately 90 most of the time.  Patient is also followed by [name], MD, for coronary artery disease ,Status post bypass surgery in 2000. Status post DES to the RCA in 2009. Most recent cardiac cath  in 2016 showed patent grafts and patent stent. He does have some angina. This is likely from microvascular disease.    HOSPITAL COURSE:  Episodes of lightheadedness/confusion/slurred speech/right facial droop and left hand tremors:given prior hx of head injury/concussion-highly suspicious for seizures. Neurology was reconsulted during this admission, prolonged EEG monitoring done, results pending, patient referred to outpatient neurology for consideration of ambulatory EEG also discussed with Dr Irish Lack given low blood pressures , orthostasis could be related to low blood pressure in the setting of sinus bradycardia. He recommends discontinuation of metoprolol at this time. He also recommends a 30 day  holter monitor which will be set up today at the time of discharge  Orthostatic Hypotension: ? Etiology. No indication of infection, did have one episode of vomiting yesterday. Does not appear dehydrated on exam. Cortisol level 11. Continue to hold metoprolol  , consideration given to diabetic autonomic neuropathy. No Florinef or midodrine is being  initiated at this time  Place thigh-high TED hose, hold antihypertensives.      History of hypertension: Allow mild permissive hypertension in a setting of orthostatic vital signs. Continue to hold beta blocker.  Insulin-dependent diabetes: CBGs stable with 20 units of Lantus and 4 units of NovoLog. Follow  History of peripheral neuropathy: Continue Neurontin and Requip.  History of CAD s/p CABG: Continue Plavix and Lipitor. Hold beta blocker given orthostatic vital signs. Spent shortness of breath  ? TIA in the past: Doubt TIA-suspect recent presentation was probably secondary to orthostatic mechanisms.    Discharge Exam:  Blood pressure 132/74, pulse 65, temperature 97.7 F (36.5 C), temperature source Oral, resp. rate 18, height 5\' 9"  (1.753 m), weight 77.792 kg (171 lb 8 oz), SpO2 95 %.      Follow-up Information    Follow up with  Penni Homans, MD. Schedule an appointment as soon as possible for a visit in 3 days.   Specialty:  Family Medicine   Contact information:   [address] [place] [address] [phone]       Follow up with [name]., MD. Schedule an appointment as soon as possible for a visit in 1 week.   Specialties:  Cardiology, Radiology, Interventional Cardiology   Why:  Hospital follow-up   Contact information:   [id number] [place] [address] [phone]       Signed: Reyne Dumas 03/16/2016, 1:35 PM        Time spent >45 mins

## 2016-03-16 NOTE — Procedures (Signed)
Continuous Video-EEG Monitoring Report  Patient: [name] [name], [name] [name]     EEG No.ID: [id number]     DOB: 01-Jun-1946 Age: 70 Room#3E27  MED REC NO: [id number] Gender: Male   TECH: [place]     Physician: Winfield Cunas     Referring Physician: Oren Binet     Report Date: 03/16/2016      Study Duration: 03/15/2016 14:10 to 03/16/2016 07:30 CPT Code:  [id number] Diagnosis:  Spells (R56.9)  History: This is a 70 year old male presenting with spells of dizziness, slurred speech and confusion followed by lethargy.  Video-EEG monitoring was performed to evaluate for seizures.  Technical Details:  Long-term video-EEG monitoring was performed using standard setting per the guidelines.  Briefly, a minimum of 21 electrodes were placed on scalp according to the International 10-20 or/and 10-10 Systems.  Supplemental electrodes were placed as needed.  Single EKG electrode was also used to detect cardiac arrhythmia.  Patient's behavior was continuously recorded on video simultaneously with EEG.  A minimum of 16 channels were used for data display.  Each epoch of study was reviewed manually daily and as needed using standard referential and bipolar montages.    EEG Description:  During wakefulness, there was a bilateral reactive 9 Hz posterior dominant rhythm.  During drowsiness, slow roving eye movement, vertex waves and attenuation of background and were seen.  K complexes and spindles were present in stage II sleep.  No focal, lateralized or periodic abnormality was seen.  No epileptiform discharges or seizures were in evidence.  No spell was captured.  Impression:  This is a normal awake and asleep EEG.  Although normal EEG does not support a diagnosis of epilepsy, it does not rule out the diagnosis                       [place] Physician: Winfield Cunas, MD, PhD

## 2016-03-16 NOTE — Care Management Note (Signed)
Case Management Note  Patient Details  Name: [name] [name] MRN: [id number] Date of Birth: 06-29-1946                  Action/Plan: Talked to patient with spouse at bedside for DCP; Ama choice offered, patient chose [facility] for HHPT; Butch Penny with [facility] called for arrangements. Also rolling walker ordered and to be delivered to the room today prior to dc home today.   Expected Discharge Date:  03/15/16               Expected Discharge Plan:  [place]  Discharge planning Services  CM Consult  Choice offered to:  Patient, Spouse  DME Arranged:  Walker rolling DME Agency:  [place]:  PT [facility] Agency:  [place]  Status of Service:  In process, will continue to follow  Medicare Important Message Given:  Yes  Sherrilyn Rist [phone] 03/16/2016, 3:37 PM

## 2016-03-16 NOTE — Evaluation (Signed)
Physical Therapy Evaluation Patient Details Name: [name] [name] MRN: [id number] DOB: 05-01-1946 Today's Date: 03/16/2016   History of Present Illness  70 yo admitted with near syncope and hypotension. PMHx: RBBB, CABG, CAD, DM, HTN, falls,multiple concussions from sports  Clinical Impression  Pt pleasant and willing to work with therapy. Pt reported feeling fine at beginning of session, but reported having a headache at the posterior part of his head at the end of the session. Pt felt lightheaded during ambulation and had to pause before continuing. Pt otherwise had good mobility during transitional movements. Pt had decreased balance and endurance. Pt would benefit from acute therapy to increase balance and endurance in order to increase independence and decrease caregiver burden. Encouraged mobility with nursing staff throughout the day. Follow up and equipment recommendations listed below.  Orthostatic VS for the past 24 hrs:  BP- Lying Pulse- Lying BP- Sitting Pulse- Sitting BP- Standing at 0 minutes Pulse- Standing at 0 minutes  03/16/16 1345 152/71 mmHg 67 122/86 mmHg 81 134/70 mmHg 91   BP standing after 3 min 125/86 BP after gait 149/76       Follow Up Recommendations Home health PT    Equipment Recommendations  Rolling walker with 5" wheels    Recommendations for Other Services       Precautions / Restrictions Precautions Precautions: Fall      Mobility  Bed Mobility Overal bed mobility: Needs Assistance Bed Mobility: Supine to Sit     Supine to sit: Supervision     General bed mobility comments: supervision for safety  Transfers Overall transfer level: Needs assistance   Transfers: Sit to/from Stand           General transfer comment: supervision for safety. requires cues for hand placement during sit to stand.  Ambulation/Gait Ambulation/Gait assistance: Min guard Ambulation Distance (Feet): 70 Feet Assistive device: Rolling walker (2  wheeled) Gait Pattern/deviations: Step-through pattern   Gait velocity interpretation: Below normal speed for age/gender General Gait Details: cues to look forward during ambulation. pt reported feeling dizzy around 35 ft mark and had to pause before walking back to room.  Stairs            Wheelchair Mobility    Modified Rankin (Stroke Patients Only)       Balance Overall balance assessment: History of Falls (>10 falls in the last year)                                           Pertinent Vitals/Pain Pain Assessment: Faces Faces Pain Scale: Hurts a little bit Pain Location: back of head Pain Descriptors / Indicators: Aching Pain Intervention(s): Limited activity within patient's tolerance;Monitored during session;Repositioned    Home Living Family/patient expects to be discharged to:: Private residence Living Arrangements: Spouse/significant other Available Help at Discharge: Family;Available 24 hours/day Type of Home: Mobile home Home Access: Stairs to enter Entrance Stairs-Rails: Can reach both;Left;Right Entrance Stairs-Number of Steps: 7 Home Layout: One level Home Equipment: Cane - single point      Prior Function Level of Independence: Independent               Hand Dominance        Extremity/Trunk Assessment   Upper Extremity Assessment: Overall WFL for tasks assessed           Lower Extremity Assessment: Generalized weakness (hip flexion left 4+/5,  right 4/5, knee flexion bil 5/5, knee ext bil 5/5, ankle dorsiflexion bil 5/5, ankle plantarflexion 5/5)      Cervical / Trunk Assessment: Normal  Communication   Communication: No difficulties  Cognition Arousal/Alertness: Awake/alert Behavior During Therapy: WFL for tasks assessed/performed         Memory: Decreased short-term memory              General Comments      Exercises        Assessment/Plan    PT Assessment Patient needs continued PT  services  PT Diagnosis Difficulty walking   PT Problem List Decreased activity tolerance;Decreased balance;Decreased mobility;Decreased knowledge of use of DME;Decreased safety awareness  PT Treatment Interventions DME instruction;Gait training;Stair training;Functional mobility training;Therapeutic activities;Therapeutic exercise;Balance training   PT Goals (Current goals can be found in the Care Plan section) Acute Rehab PT Goals Patient Stated Goal: be able to play with grandson PT Goal Formulation: With patient Time For Goal Achievement: 03/23/16 Potential to Achieve Goals: Good    Frequency Min 3X/week   Barriers to discharge        Co-evaluation               End of Session Equipment Utilized During Treatment: Gait belt Activity Tolerance: Patient tolerated treatment well Patient left: in chair;with family/visitor present;with chair alarm set;with call bell/phone within reach Nurse Communication: Mobility status         Time: [id number] PT Time Calculation (min) (ACUTE ONLY): 28 min   Charges:   PT Evaluation $PT Eval Moderate Complexity: 1 Procedure     PT G CodesHaze Justin 03/23/2016, 2:35 PM   Haze Justin, [place]

## 2016-03-16 NOTE — Progress Notes (Signed)
Subjective: No events  Exam: Filed Vitals:   03/16/16 1042 03/16/16 1345  BP: 132/74 149/76  Pulse:    Temp:    Resp:     Gen: In bed, NAD Resp: non-labored breathing, no acute distress Abd: soft, nt  Neuro: MS: awake, alert. Interactive and appropriate.   Pertinent Labs: Bmp - unremarkable  Impression: 70 yo F with transient episodes concerning for syncope vs seizures. Given the association with being up and around, may benefit from ambulatory monitoring but this will need to be arranged as an outpatient.   Recommendations: 1)Follow up with Dr. [name] as scheduled. (next week per pt)  Roland Rack, MD [facility] [phone]  If 7pm- 7am, please page neurology on call as listed in [place].

## 2016-03-16 NOTE — Progress Notes (Signed)
vLTM EEG complete/ no skin breakdown with unhook. Results pending

## 2016-03-16 NOTE — Care Management Important Message (Signed)
Important Message  Patient Details  Name: LAUTARO THIM MRN: [id number] Date of Birth: 1946/10/13   Medicare Important Message Given:  Yes    Barb Merino [place] 03/16/2016, 12:14 PM

## 2016-03-17 ENCOUNTER — Ambulatory Visit (INDEPENDENT_AMBULATORY_CARE_PROVIDER_SITE_OTHER): Payer: Commercial Managed Care - HMO | Admitting: Physician Assistant

## 2016-03-17 ENCOUNTER — Encounter: Payer: Self-pay | Admitting: Physician Assistant

## 2016-03-17 ENCOUNTER — Other Ambulatory Visit: Payer: Self-pay | Admitting: *Deleted

## 2016-03-17 ENCOUNTER — Telehealth: Payer: Self-pay | Admitting: Physician Assistant

## 2016-03-17 VITALS — BP 100/58 | HR 91 | Temp 97.9°F | Ht 69.0 in | Wt 178.8 lb

## 2016-03-17 DIAGNOSIS — I951 Orthostatic hypotension: Secondary | ICD-10-CM | POA: Diagnosis not present

## 2016-03-17 DIAGNOSIS — R569 Unspecified convulsions: Secondary | ICD-10-CM | POA: Diagnosis not present

## 2016-03-17 LAB — CBC WITH DIFFERENTIAL/PLATELET
Basophils Absolute: 0 10*3/uL (ref 0.0–0.1)
Basophils Relative: 0.3 % (ref 0.0–3.0)
EOS PCT: 2.3 % (ref 0.0–5.0)
Eosinophils Absolute: 0.2 10*3/uL (ref 0.0–0.7)
HEMATOCRIT: 42.4 % (ref 39.0–52.0)
HEMOGLOBIN: 14.5 g/dL (ref 13.0–17.0)
LYMPHS ABS: 2.7 10*3/uL (ref 0.7–4.0)
LYMPHS PCT: 25.6 % (ref 12.0–46.0)
MCHC: 34.1 g/dL (ref 30.0–36.0)
MCV: 90.3 fl (ref 78.0–100.0)
MONOS PCT: 6.4 % (ref 3.0–12.0)
Monocytes Absolute: 0.7 10*3/uL (ref 0.1–1.0)
Neutro Abs: 6.9 10*3/uL (ref 1.4–7.7)
Neutrophils Relative %: 65.4 % (ref 43.0–77.0)
Platelets: 215 10*3/uL (ref 150.0–400.0)
RBC: 4.69 Mil/uL (ref 4.22–5.81)
RDW: 13.8 % (ref 11.5–15.5)
WBC: 10.6 10*3/uL — ABNORMAL HIGH (ref 4.0–10.5)

## 2016-03-17 LAB — COMPREHENSIVE METABOLIC PANEL
ALBUMIN: 4.7 g/dL (ref 3.5–5.2)
ALK PHOS: 86 U/L (ref 39–117)
ALT: 28 U/L (ref 0–53)
AST: 22 U/L (ref 0–37)
BILIRUBIN TOTAL: 0.7 mg/dL (ref 0.2–1.2)
BUN: 17 mg/dL (ref 6–23)
CALCIUM: 10 mg/dL (ref 8.4–10.5)
CO2: 26 mEq/L (ref 19–32)
Chloride: 100 mEq/L (ref 96–112)
Creatinine, Ser: 1.12 mg/dL (ref 0.40–1.50)
GFR: 69.4 mL/min (ref >60.00)
Glucose, Bld: 363 mg/dL — ABNORMAL HIGH (ref 70–99)
POTASSIUM: 4.4 meq/L (ref 3.5–5.1)
Sodium: 134 mEq/L — ABNORMAL LOW (ref 135–145)
TOTAL PROTEIN: 7.6 g/dL (ref 6.0–8.3)

## 2016-03-17 MED ORDER — ONDANSETRON 4 MG PO TBDP: 4.0000 mg | ORAL_TABLET | Freq: Three times a day (TID) | ORAL | Status: DC | PRN

## 2016-03-17 MED ORDER — MIDODRINE HCL 5 MG PO TABS: 5.0000 mg | ORAL_TABLET | Freq: Three times a day (TID) | ORAL | Status: DC

## 2016-03-17 MED FILL — ONDANSETRON ODT 4 MG TABLET: 4 | 7 days supply | Qty: 20 | Fill #0

## 2016-03-17 MED FILL — MIDODRINE HCL 10 MG TABLET: 10 | 30 days supply | Qty: 45 | Fill #0

## 2016-03-17 NOTE — Patient Outreach (Signed)
Referral received from hospital liaison to initiate transition of care program once member discharged.  He was recently hospitalized for hypotension, discharged on yesterday.  According to chart, he has history of hypertension, diabetes, congestive heart failure, and bradycardia.  Call placed to member to initiate program, no answer.  HIPPA complaint voice message left, will await call back, will make second attempt to contact later this week.  Valente David, BSN, [place] [facility]  [facility] Care Manager [phone]

## 2016-03-17 NOTE — Progress Notes (Signed)
Pre visit review using our clinic review tool, if applicable. No additional management support is needed unless otherwise documented below in the visit note. 

## 2016-03-17 NOTE — Progress Notes (Signed)
Patient presents to clinic today for hospital follow-up of multiple issues.   Patient admitted to hospital on 03/13/16 for syncopal episodes/lightheadedness/R facial drooping. Patient had extensive workup including consultation by both Cardiology and Neurology. Giving patient's history of head trauma, their was high suspicion for seizures. Prolonged EEG performed during hospitalization with results pending at discharge. CT, MRA negative for stroke or mass. Patient was set up for outpatient Neurology appointment. Cardiology felt symptoms due to hypotension secondary to bradycardia. Metoprolol was stopped. 30-day holter study was recommended outpatient (to be set up by Cardiology).  Patient recommended daily TED hose but did not start pharmacotherapy for orthostatic hypotension.   Since discharge patient endorses continued lightheadedness but denies syncope. Patient denies chest pain, palpitations, dizziness, vision changes or frequent headaches. Is not taking BP medication. Endorses staying well hydrated. Is wearing compression stockings as directed. Denies recurrence of facial drooping, focal weakness or change in mentation. Endorses he feels very weak and has been using a cane to assist in ambulation. Patient and wife deny fall. Has appointments scheduled to follow-up with both Cardiology and Neurology.  Past Medical History  Diagnosis Date  . Diabetes mellitus type II, uncontrolled ([place])   . OSA (obstructive sleep apnea)   . Hyperlipidemia   . RBBB (right bundle branch block)   . Depression     Bipolar  . Edema   . Hypertension   . Falling episodes     these have occurred in the past and again recurring 2011  . Spondylosis     C5-6, C6-7 MRI 2010  . CAD (coronary artery disease)     A. CABG in 2000,status post cardiac cath in 2006, 2009 ....continued chest pain and SOB despite oral medication adjestments including Ranexa. B. Cath November 2009/ mRCA - 2.75 x 23 Abbott Xience V drug-eluting  stent ...11/26/2008 to distal  RCA leading to acute marginal.  C. Cath 07/2012 for CP - stable anatomy, med rx. d. cath 2015 and 05/30/2015 stable anatomy, consider Myoview if has CP again  . Hx of CABG     2000,  / one median sternotomy suture broken her chest x-ray November, 2010, no clinical significance  . Palpitations     event recorder showed sinus rhythm  . Nephrolithiasis   . Restless leg   . Dizziness   . Shortness of breath     CPX April, 2011, mild functional limitation, no clear pulmonary or cardiac limitation, possible deconditioning and mild chronotropic incompetence( peak heart rate 130)  . Cerebral ischemia     MRI November, 2010, chronic microvascular ischemia  . Anemia     hemoglobin 7.4, iron deficiency, January, 2011, 2 unit transfusion, endoscopy normal, capsule endoscopy February, 2011 no small bowel abnormalities.   Most likely source gastric erosions, followed by GI  . H/O medication noncompliance     Due to loss of insurance  . Ejection fraction     EF 60%, echo, July 31, 2012  . Diabetes mellitus without complication ([place])   . Pain in limb 06/12/2009    Qualifier: Diagnosis of  By: [name]   . Low back pain 06/12/2009    Qualifier: Diagnosis of  By: [name]   . Thyroid disease   . Right knee pain 01/07/2015  . Advance care planning 05/01/2015  . ACP (advance care planning) 05/11/2015  . Nausea with vomiting 01/25/2016  . Headache 01/25/2016  . Diarrhea 02/15/2016  . Gastric ulcer   . Tubulovillous  adenoma of colon 2007  . Iron deficiency anemia   . Syncope 03/2016  . Chronic diastolic CHF (congestive heart failure) ([place]) 03/13/2016    Current Outpatient Prescriptions on File Prior to Visit  Medication Sig Dispense Refill  . atorvastatin (LIPITOR) 40 MG tablet Take 1 tablet (40 mg total) by mouth daily at 6 PM. (Patient taking differently: Take 40 mg by mouth daily. ) 30 tablet 5  . baclofen (LIORESAL) 10 MG tablet Take 0.5 tablets (5 mg total) by  mouth at bedtime. 30 each 0  . Cholecalciferol (DIALYVITE VITAMIN D3 MAX) 27253 units TABS Take 1 tablet by mouth once a week. (Patient taking differently: Take 1 tablet by mouth once a week. Wednesdays) 12 tablet 1  . clopidogrel (PLAVIX) 75 MG tablet Take 1 tablet (75 mg total) by mouth daily. 30 tablet 3  . clotrimazole-betamethasone (LOTRISONE) cream APPLY TO THE AFFECTED TWICE DAILY (Patient taking differently: Apply 1 application topically 2 (two) times daily as needed (dry skin). ) 30 g 6  . diclofenac sodium (VOLTAREN) 1 % GEL Apply 2 g topically 3 (three) times daily as needed. For pain to affected area (Patient taking differently: Apply 1 application topically 3 (three) times daily as needed (pain). ) 2 Tube 3  . gabapentin (NEURONTIN) 300 MG capsule 2 capsules in AM, 1 capsule at noon, 2 capsules in PM daily (Patient taking differently: Take 300-600 mg by mouth 3 (three) times daily. Take 2 capsules (600 mg) every morning, 1 capsule (300 mg) at noon, and 2 capsules (600 mg) at night) 150 capsule 3  . glucose blood (ACCU-CHEK AVIVA) test strip Use as directed to check blood sugar 4 times daily.  DX E11.9 200 each 6  . insulin aspart (NOVOLOG) 100 UNIT/ML injection Inject 10-17 Units into the skin 3 (three) times daily with meals. Use 10-17 unit 3 times daily before meals (Patient taking differently: Inject 10-17 Units into the skin 3 (three) times daily with meals. Per sliding scale) 15 mL 4  . Insulin Glargine (TOUJEO SOLOSTAR) 300 UNIT/ML SOPN Inject 42 Units into the skin at bedtime. 3 pen 4  . levothyroxine (SYNTHROID, LEVOTHROID) 75 MCG tablet Take 1 tablet (75 mcg total) by mouth daily. 90 tablet 2  . metFORMIN (GLUCOPHAGE) 1000 MG tablet Take 1 tablet (1,000 mg total) by mouth 2 (two) times daily with a meal. 60 tablet 5  . nitroGLYCERIN (NITROSTAT) 0.4 MG SL tablet Place 1 tablet (0.4 mg total) under the tongue every 5 (five) minutes as needed (up to 3 doses). (Patient taking  differently: Place 0.4 mg under the tongue every 5 (five) minutes as needed for chest pain (up to 3 doses). ) 25 tablet 4  . pantoprazole (PROTONIX) 40 MG tablet Take 40 mg by mouth daily.    . pramipexole (MIRAPEX) 1 MG tablet Take 1 mg by mouth at bedtime.    . ranolazine (RANEXA) 500 MG 12 hr tablet Take 500 mg by mouth 2 (two) times daily.    . sertraline (ZOLOFT) 100 MG tablet Take 1 tablet (100 mg total) by mouth 2 (two) times daily. 180 tablet 1  . tiZANidine (ZANAFLEX) 4 MG tablet Take 1 tablet (4 mg total) by mouth every 8 (eight) hours as needed for muscle spasms. 90 tablet 2  . ondansetron (ZOFRAN-ODT) 4 MG disintegrating tablet Take 1 tablet (4 mg total) by mouth every 8 (eight) hours as needed for nausea or vomiting. (Patient not taking: Reported on 03/13/2016) 20 tablet 0  .  topiramate (TOPAMAX) 50 MG tablet Take 1 tablet (50 mg total) by mouth at bedtime. (Patient not taking: Reported on 03/17/2016) 30 tablet 3   No current facility-administered medications on file prior to visit.    Allergies  Allergen Reactions  . Morphine Other (See Comments)    hallucinations  . Shellfish-Derived Products Itching    Family History  Problem Relation Age of Onset  . Cancer Brother     pancreatic cancer  . Diabetes Brother     4   . Stroke Brother   . Diabetes Brother   . Cancer Brother   . Diabetes Brother   . Coronary artery disease Brother   . Diabetes Brother   . Hypothyroidism Brother   . Heart attack      Nephew  . Diabetes Mother   . Heart failure Mother   . Heart failure Father   . Irregular heart beat Daughter   . Cancer Maternal Grandmother     unknown     Social History   Social History  . Marital Status: Married    Spouse Name: N/A  . Number of Children: 4  . Years of Education: 13   Occupational History  . retired    Social History Main Topics  . Smoking status: Never Smoker   . Smokeless tobacco: Never Used  . Alcohol Use: No     Comment: stopped  drinking in 1998  . Drug Use: No  . Sexual Activity:    Partners: Female   Other Topics Concern  . None   Social History Narrative   Patient is right handed.   Patient does not drink caffeine.   Review of Systems - See HPI.  All other ROS are negative.  BP 100/58 mmHg  Pulse 91  Temp(Src) 97.9 F (36.6 C) (Oral)  Ht _0  (1.753 m)  Wt 178 lb 12.8 oz (81.103 kg)  BMI 26.39 kg/m2  SpO2 98%  Physical Exam  Constitutional: He is oriented to person, place, and time and well-developed, well-nourished, and in no distress.  HENT:  Head: Normocephalic and atraumatic.  Eyes: Conjunctivae are normal. Pupils are equal, round, and reactive to light.  Neck: Neck supple.  Cardiovascular: Normal rate, regular rhythm, normal heart sounds and intact distal pulses.   Pulmonary/Chest: Effort normal and breath sounds normal. No respiratory distress. He has no wheezes. He has no rales. He exhibits no tenderness.  Neurological: He is alert and oriented to person, place, and time.  Skin: Skin is warm and dry. No rash noted.  Vitals reviewed.   Recent Results (from the past 2160 hour(s))  Urine culture     Status: None   Collection Time: 01/15/16  3:10 PM  Result Value Ref Range   Colony Count 3,000 COLONIES/ML    Organism ID, Bacteria Insignificant Growth   CBC w/Diff/Platelet     Status: None   Collection Time: 01/15/16  3:10 PM  Result Value Ref Range   WBC 8.5 4.0 - 10.5 K/uL   RBC 4.61 4.22 - 5.81 Mil/uL   Hemoglobin 14.2 13.0 - 17.0 g/dL   HCT 41.7 39.0 - 52.0 %   MCV 90.5 78.0 - 100.0 fl   MCHC 34.1 30.0 - 36.0 g/dL   RDW 13.6 11.5 - 15.5 %   Platelets 218.0 150.0 - 400.0 K/uL   Neutrophils Relative % 58.8 43.0 - 77.0 %   Lymphocytes Relative 30.6 12.0 - 46.0 %   Monocytes Relative 7.1 3.0 - 12.0 %  Eosinophils Relative 3.3 0.0 - 5.0 %   Basophils Relative 0.2 0.0 - 3.0 %   Neutro Abs 5.0 1.4 - 7.7 K/uL   Lymphs Abs 2.6 0.7 - 4.0 K/uL   Monocytes Absolute 0.6 0.1 - 1.0  K/uL   Eosinophils Absolute 0.3 0.0 - 0.7 K/uL   Basophils Absolute 0.0 0.0 - 0.1 K/uL  B12     Status: None   Collection Time: 01/15/16  3:10 PM  Result Value Ref Range   Vitamin B-12 340 200 - 1100 pg/mL  TSH     Status: None   Collection Time: 01/15/16  3:10 PM  Result Value Ref Range   TSH 4.44 0.35 - 4.50 uIU/mL  Folate     Status: None   Collection Time: 01/15/16  3:10 PM  Result Value Ref Range   Folate >24.0 >5.4 ng/mL    Comment: Reference Range >17 years:   Low: <3.4 ng/mL              Borderline: 3.4-5.4 ng/mL              Normal: >5.4 ng/mL     Heavy Metals Panel, Blood     Status: None   Collection Time: 01/15/16  3:10 PM  Result Value Ref Range   Arsenic <3 <23 mcg/L    Comment: Whole Blood Arsenic level >100 mcg/L is indicative of acute/chronic exposure. Urine is usually the best specimen for the analysis of arsenic in body fluids. Blood levels tend to be low even when urine concentrations are high    Mercury, B <4 <=10 mcg/L   Lead <1 <5 mcg/dL    Comment: This test was developed and its analytical performance characteristics have been determined by [place], [place]. It has not been cleared or approved by the U.S. Food and Drug Administration. This assay has been validated pursuant to the CLIA regulations and is used for clinical purposes.    Collection site Venous   Vitamin A     Status: None   Collection Time: 01/15/16  3:10 PM  Result Value Ref Range   Vitamin A (Retinoic Acid) 70 38 - 98 mcg/dL    Comment: Vitamin supplementation within 24 hours prior to blood draw may affect the accuracy of the results. This test was developed and its analytical performance characteristics have been determined by [place], [place]. It has not been cleared or approved by the U.S. Food and Drug Administration. This assay has been validated pursuant to the CLIA regulations and is used for  clinical purposes.   Footnotes:  (1) ** Please note change in reference range(s). **     VITAMIN D 25 Hydroxy (Vit-D Deficiency, Fractures)     Status: Abnormal   Collection Time: 01/15/16  3:10 PM  Result Value Ref Range   Vit D, 25-Hydroxy 10 (L) 30 - 100 ng/mL    Comment: Vitamin D Status           25-OH Vitamin D        Deficiency                <20 ng/mL        Insufficiency         20 - 29 ng/mL        Optimal             > or = 30 ng/mL   For 25-OH Vitamin D testing on patients on D2-supplementation and  patients for whom quantitation of D2 and D3 fractions is required, the QuestAssureD 25-OH VIT D, (D2,D3), LC/MS/MS is recommended: order code [phone] (patients > 2 yrs).   Ethanol     Status: None   Collection Time: 01/15/16  3:10 PM  Result Value Ref Range   Alcohol, Ethyl (B) <10 0 - 10 mg/dL    Comment: For medical purposes only.     Urinalysis     Status: Abnormal   Collection Time: 01/15/16  3:10 PM  Result Value Ref Range   Color, Urine YELLOW Yellow;Lt. Yellow   APPearance CLEAR Clear   Specific Gravity, Urine 1.020 1.000-1.030   pH 5.5 5.0 - 8.0   Total Protein, Urine NEGATIVE Negative   Urine Glucose 250 (A) Negative   Ketones, ur TRACE (A) Negative   Bilirubin Urine NEGATIVE Negative   Hgb urine dipstick NEGATIVE Negative   Urobilinogen, UA 0.2 0.0 - 1.0   Leukocytes, UA NEGATIVE Negative   Nitrite NEGATIVE Negative  RPR     Status: None   Collection Time: 01/16/16  3:47 PM  Result Value Ref Range   RPR Ser Ql NON REAC NON REAC  Comprehensive metabolic panel     Status: Abnormal   Collection Time: 01/16/16  4:25 PM  Result Value Ref Range   Sodium 137 135 - 146 mmol/L   Potassium 4.1 3.5 - 5.3 mmol/L   Chloride 102 98 - 110 mmol/L   CO2 28 20 - 31 mmol/L   Glucose, Bld 107 (H) 65 - 99 mg/dL   BUN 12 7 - 25 mg/dL   Creat 1.02 0.70 - 1.25 mg/dL   Total Bilirubin 0.7 0.2 - 1.2 mg/dL   Alkaline Phosphatase 68 40 - 115 U/L   AST 16 10 - 35 U/L    ALT 13 9 - 46 U/L   Total Protein 6.9 6.1 - 8.1 g/dL   Albumin 4.6 3.6 - 5.1 g/dL   Calcium 9.4 8.6 - 10.3 mg/dL  Sed Rate (ESR)     Status: None   Collection Time: 01/16/16  4:25 PM  Result Value Ref Range   Sed Rate 4 0 - 20 mm/hr  Amylase     Status: Abnormal   Collection Time: 02/09/16  4:56 PM  Result Value Ref Range   Amylase 22 (L) 27 - 131 U/L  Lipid panel     Status: Abnormal   Collection Time: 02/09/16  4:56 PM  Result Value Ref Range   Cholesterol 75 0 - 200 mg/dL    Comment: ATP III Classification       Desirable:  < 200 mg/dL               Borderline High:  200 - 239 mg/dL          High:  > = 240 mg/dL   Triglycerides 244.0 (H) 0.0 - 149.0 mg/dL    Comment: Normal:  <150 mg/dLBorderline High:  150 - 199 mg/dL   HDL 27.20 (L) >39.00 mg/dL   VLDL 48.8 (H) 0.0 - 40.0 mg/dL   Total CHOL/HDL Ratio 3     Comment:                Men          Women1/2 Average Risk     3.4          3.3Average Risk          5.0  4.42X Average Risk          9.6          7.13X Average Risk          15.0          11.0                       NonHDL 48.13     Comment: NOTE:  Non-HDL goal should be 30 mg/dL higher than patient's LDL goal (i.e. LDL goal of < 70 mg/dL, would have non-HDL goal of < 100 mg/dL)  TSH     Status: None   Collection Time: 02/09/16  4:56 PM  Result Value Ref Range   TSH 4.43 0.35 - 4.50 uIU/mL  Hemoglobin A1c     Status: Abnormal   Collection Time: 02/09/16  4:56 PM  Result Value Ref Range   Hgb A1c MFr Bld 9.3 (H) 4.6 - 6.5 %    Comment: Glycemic Control Guidelines for People with Diabetes:Non Diabetic:  <6%Goal of Therapy: <7%Additional Action Suggested:  >8%   CBC     Status: None   Collection Time: 02/09/16  4:56 PM  Result Value Ref Range   WBC 9.0 4.0 - 10.5 K/uL   RBC 4.65 4.22 - 5.81 Mil/uL   Platelets 198.0 150.0 - 400.0 K/uL   Hemoglobin 14.5 13.0 - 17.0 g/dL   HCT 41.5 39.0 - 52.0 %   MCV 89.3 78.0 - 100.0 fl   MCHC 35.0 30.0 - 36.0 g/dL   RDW 13.3  11.5 - 15.5 %  Comprehensive metabolic panel     Status: Abnormal   Collection Time: 02/09/16  4:56 PM  Result Value Ref Range   Sodium 137 135 - 145 mEq/L   Potassium 4.4 3.5 - 5.1 mEq/L   Chloride 99 96 - 112 mEq/L   CO2 26 19 - 32 mEq/L   Glucose, Bld 284 (H) 70 - 99 mg/dL   BUN 13 6 - 23 mg/dL   Creatinine, Ser 1.06 0.40 - 1.50 mg/dL   Total Bilirubin 0.6 0.2 - 1.2 mg/dL   Alkaline Phosphatase 73 39 - 117 U/L   AST 17 0 - 37 U/L   ALT 26 0 - 53 U/L   Total Protein 6.9 6.0 - 8.3 g/dL   Albumin 4.7 3.5 - 5.2 g/dL   Calcium 9.9 8.4 - 10.5 mg/dL   GFR 73.97 >60.00 mL/min  Lipase     Status: None   Collection Time: 02/09/16  4:56 PM  Result Value Ref Range   Lipase 14.0 11.0 - 59.0 U/L  LDL cholesterol, direct     Status: None   Collection Time: 02/09/16  4:56 PM  Result Value Ref Range   Direct LDL 25.0 mg/dL    Comment: Optimal:  <100 mg/dLNear or Above Optimal:  100-129 mg/dLBorderline High:  130-159 mg/dLHigh:  160-189 mg/dLVery High:  >190 mg/dL  CBG monitoring, ED     Status: Abnormal   Collection Time: 03/08/16  3:55 PM  Result Value Ref Range   Glucose-Capillary 521 (H) 65 - 99 mg/dL  Urinalysis, Routine w reflex microscopic     Status: Abnormal   Collection Time: 03/08/16  5:05 PM  Result Value Ref Range   Color, Urine YELLOW YELLOW   APPearance CLEAR CLEAR   Specific Gravity, Urine 1.018 1.005 - 1.030   pH 6.0 5.0 - 8.0   Glucose, UA >1000 (A) NEGATIVE mg/dL   Hgb urine dipstick  NEGATIVE NEGATIVE   Bilirubin Urine NEGATIVE NEGATIVE   Ketones, ur NEGATIVE NEGATIVE mg/dL   Protein, ur NEGATIVE NEGATIVE mg/dL   Nitrite NEGATIVE NEGATIVE   Leukocytes, UA NEGATIVE NEGATIVE  Urine microscopic-add on     Status: Abnormal   Collection Time: 03/08/16  5:05 PM  Result Value Ref Range   Squamous Epithelial / LPF 0-5 (A) NONE SEEN   WBC, UA 0-5 0 - 5 WBC/hpf   RBC / HPF 0-5 0 - 5 RBC/hpf   Bacteria, UA NONE SEEN NONE SEEN   Urine-Other MUCOUS PRESENT   Comprehensive  metabolic panel     Status: Abnormal   Collection Time: 03/08/16  5:28 PM  Result Value Ref Range   Sodium 134 (L) 135 - 145 mmol/L   Potassium 4.7 3.5 - 5.1 mmol/L   Chloride 100 (L) 101 - 111 mmol/L   CO2 26 22 - 32 mmol/L   Glucose, Bld 450 (H) 65 - 99 mg/dL   BUN 15 6 - 20 mg/dL   Creatinine, Ser 7.61 0.61 - 1.24 mg/dL   Calcium 9.1 8.9 - 14.1 mg/dL   Total Protein 7.2 6.5 - 8.1 g/dL   Albumin 4.3 3.5 - 5.0 g/dL   AST 26 15 - 41 U/L   ALT 34 17 - 63 U/L   Alkaline Phosphatase 82 38 - 126 U/L   Total Bilirubin 0.7 0.3 - 1.2 mg/dL   GFR calc non Af Amer >60 >60 mL/min   GFR calc Af Amer >60 >60 mL/min    Comment: (NOTE) The eGFR has been calculated using the CKD EPI equation. This calculation has not been validated in all clinical situations. eGFR's persistently <60 mL/min signify possible Chronic Kidney Disease.    Anion gap 8 5 - 15  Troponin I     Status: None   Collection Time: 03/08/16  5:28 PM  Result Value Ref Range   Troponin I <0.03 <0.031 ng/mL    Comment:        NO INDICATION OF MYOCARDIAL INJURY.   CBC with Differential     Status: Abnormal   Collection Time: 03/08/16  5:28 PM  Result Value Ref Range   WBC 6.6 4.0 - 10.5 K/uL   RBC 4.28 4.22 - 5.81 MIL/uL   Hemoglobin 13.5 13.0 - 17.0 g/dL   HCT 12.5 (L) 93.2 - 72.8 %   MCV 89.5 78.0 - 100.0 fL   MCH 31.5 26.0 - 34.0 pg   MCHC 35.2 30.0 - 36.0 g/dL   RDW 88.9 90.8 - 59.1 %   Platelets 148 (L) 150 - 400 K/uL   Neutrophils Relative % 62 %   Neutro Abs 4.0 1.7 - 7.7 K/uL   Lymphocytes Relative 26 %   Lymphs Abs 1.7 0.7 - 4.0 K/uL   Monocytes Relative 9 %   Monocytes Absolute 0.6 0.1 - 1.0 K/uL   Eosinophils Relative 3 %   Eosinophils Absolute 0.2 0.0 - 0.7 K/uL   Basophils Relative 0 %   Basophils Absolute 0.0 0.0 - 0.1 K/uL  Sedimentation rate     Status: None   Collection Time: 03/08/16  5:28 PM  Result Value Ref Range   Sed Rate 3 0 - 16 mm/hr  CBG monitoring, ED     Status: Abnormal    Collection Time: 03/09/16  8:00 PM  Result Value Ref Range   Glucose-Capillary 422 (H) 65 - 99 mg/dL   Comment 1 Notify RN    Comment 2 Document  in Chart   Comprehensive metabolic panel     Status: Abnormal   Collection Time: 03/09/16  8:36 PM  Result Value Ref Range   Sodium 134 (L) 135 - 145 mmol/L   Potassium 4.4 3.5 - 5.1 mmol/L   Chloride 105 101 - 111 mmol/L   CO2 17 (L) 22 - 32 mmol/L   Glucose, Bld 445 (H) 65 - 99 mg/dL   BUN 15 6 - 20 mg/dL   Creatinine, Ser 1.25 (H) 0.61 - 1.24 mg/dL   Calcium 8.6 (L) 8.9 - 10.3 mg/dL   Total Protein 5.7 (L) 6.5 - 8.1 g/dL   Albumin 3.5 3.5 - 5.0 g/dL   AST 23 15 - 41 U/L   ALT 29 17 - 63 U/L   Alkaline Phosphatase 89 38 - 126 U/L   Total Bilirubin 0.8 0.3 - 1.2 mg/dL   GFR calc non Af Amer 58 (L) >60 mL/min   GFR calc Af Amer >60 >60 mL/min    Comment: (NOTE) The eGFR has been calculated using the CKD EPI equation. This calculation has not been validated in all clinical situations. eGFR's persistently <60 mL/min signify possible Chronic Kidney Disease.    Anion gap 12 5 - 15  Troponin I     Status: None   Collection Time: 03/09/16  8:36 PM  Result Value Ref Range   Troponin I <0.03 <0.031 ng/mL    Comment:        NO INDICATION OF MYOCARDIAL INJURY.   CBC with Differential     Status: Abnormal   Collection Time: 03/09/16  8:36 PM  Result Value Ref Range   WBC 8.0 4.0 - 10.5 K/uL   RBC 3.85 (L) 4.22 - 5.81 MIL/uL   Hemoglobin 12.2 (L) 13.0 - 17.0 g/dL   HCT 34.0 (L) 39.0 - 52.0 %   MCV 88.3 78.0 - 100.0 fL   MCH 31.7 26.0 - 34.0 pg   MCHC 35.9 30.0 - 36.0 g/dL   RDW 13.2 11.5 - 15.5 %   Platelets 144 (L) 150 - 400 K/uL   Neutrophils Relative % 65 %   Neutro Abs 5.2 1.7 - 7.7 K/uL   Lymphocytes Relative 25 %   Lymphs Abs 2.0 0.7 - 4.0 K/uL   Monocytes Relative 8 %   Monocytes Absolute 0.6 0.1 - 1.0 K/uL   Eosinophils Relative 2 %   Eosinophils Absolute 0.2 0.0 - 0.7 K/uL   Basophils Relative 0 %   Basophils Absolute  0.0 0.0 - 0.1 K/uL  CBG monitoring, ED     Status: None   Collection Time: 03/10/16  4:05 AM  Result Value Ref Range   Glucose-Capillary 94 65 - 99 mg/dL   Comment 1 Notify RN    Comment 2 Document in Chart   CBC WITH DIFFERENTIAL     Status: Abnormal   Collection Time: 03/10/16  5:50 AM  Result Value Ref Range   WBC 6.1 4.0 - 10.5 K/uL   RBC 3.92 (L) 4.22 - 5.81 MIL/uL   Hemoglobin 11.8 (L) 13.0 - 17.0 g/dL   HCT 34.3 (L) 39.0 - 52.0 %   MCV 87.5 78.0 - 100.0 fL   MCH 30.1 26.0 - 34.0 pg   MCHC 34.4 30.0 - 36.0 g/dL   RDW 13.2 11.5 - 15.5 %   Platelets 138 (L) 150 - 400 K/uL   Neutrophils Relative % 56 %   Neutro Abs 3.4 1.7 - 7.7 K/uL   Lymphocytes Relative 33 %  Lymphs Abs 2.0 0.7 - 4.0 K/uL   Monocytes Relative 8 %   Monocytes Absolute 0.5 0.1 - 1.0 K/uL   Eosinophils Relative 3 %   Eosinophils Absolute 0.2 0.0 - 0.7 K/uL   Basophils Relative 0 %   Basophils Absolute 0.0 0.0 - 0.1 K/uL  TSH     Status: Abnormal   Collection Time: 03/10/16  5:50 AM  Result Value Ref Range   TSH 5.287 (H) 0.350 - 4.500 uIU/mL  Hemoglobin A1c     Status: Abnormal   Collection Time: 03/10/16  5:50 AM  Result Value Ref Range   Hgb A1c MFr Bld 10.0 (H) 4.8 - 5.6 %    Comment: (NOTE)         Pre-diabetes: 5.7 - 6.4         Diabetes: >6.4         Glycemic control for adults with diabetes: <7.0    Mean Plasma Glucose 240 mg/dL    Comment: (NOTE) Performed At: [facility] [place], [place] [id number] Lindon Romp MD YS:[id number]   Comprehensive metabolic panel     Status: Abnormal   Collection Time: 03/10/16  5:50 AM  Result Value Ref Range   Sodium 142 135 - 145 mmol/L    Comment: DELTA CHECK NOTED   Potassium 3.4 (L) 3.5 - 5.1 mmol/L    Comment: DELTA CHECK NOTED   Chloride 108 101 - 111 mmol/L   CO2 24 22 - 32 mmol/L   Glucose, Bld 86 65 - 99 mg/dL   BUN 12 6 - 20 mg/dL   Creatinine, Ser 1.05 0.61 - 1.24 mg/dL   Calcium 8.7 (L) 8.9 - 10.3 mg/dL    Total Protein 5.7 (L) 6.5 - 8.1 g/dL   Albumin 3.4 (L) 3.5 - 5.0 g/dL   AST 20 15 - 41 U/L   ALT 26 17 - 63 U/L   Alkaline Phosphatase 61 38 - 126 U/L   Total Bilirubin 0.7 0.3 - 1.2 mg/dL   GFR calc non Af Amer >60 >60 mL/min   GFR calc Af Amer >60 >60 mL/min    Comment: (NOTE) The eGFR has been calculated using the CKD EPI equation. This calculation has not been validated in all clinical situations. eGFR's persistently <60 mL/min signify possible Chronic Kidney Disease.    Anion gap 10 5 - 15  Magnesium     Status: None   Collection Time: 03/10/16  5:50 AM  Result Value Ref Range   Magnesium 1.8 1.7 - 2.4 mg/dL  Phosphorus     Status: None   Collection Time: 03/10/16  5:50 AM  Result Value Ref Range   Phosphorus 4.0 2.5 - 4.6 mg/dL  Glucose, capillary     Status: Abnormal   Collection Time: 03/10/16 11:50 AM  Result Value Ref Range   Glucose-Capillary 273 (H) 65 - 99 mg/dL   Comment 1 Notify RN    Comment 2 Document in Chart   Echocardiogram     Status: None   Collection Time: 03/10/16  1:53 PM  Result Value Ref Range   Weight 2846.4 oz   Height 69 in   BP 150/72 mmHg  Glucose, capillary     Status: Abnormal   Collection Time: 03/10/16  4:58 PM  Result Value Ref Range   Glucose-Capillary 143 (H) 65 - 99 mg/dL   Comment 1 Notify RN    Comment 2 Document in Chart   Glucose, capillary  Status: Abnormal   Collection Time: 03/10/16  9:07 PM  Result Value Ref Range   Glucose-Capillary 266 (H) 65 - 99 mg/dL  CBC     Status: Abnormal   Collection Time: 03/11/16  3:44 AM  Result Value Ref Range   WBC 6.6 4.0 - 10.5 K/uL   RBC 4.15 (L) 4.22 - 5.81 MIL/uL   Hemoglobin 12.6 (L) 13.0 - 17.0 g/dL   HCT 36.5 (L) 39.0 - 52.0 %   MCV 88.0 78.0 - 100.0 fL   MCH 30.4 26.0 - 34.0 pg   MCHC 34.5 30.0 - 36.0 g/dL   RDW 13.2 11.5 - 15.5 %   Platelets 153 150 - 400 K/uL  Basic metabolic panel     Status: Abnormal   Collection Time: 03/11/16  3:44 AM  Result Value Ref Range    Sodium 140 135 - 145 mmol/L   Potassium 3.9 3.5 - 5.1 mmol/L   Chloride 107 101 - 111 mmol/L   CO2 22 22 - 32 mmol/L   Glucose, Bld 209 (H) 65 - 99 mg/dL   BUN 10 6 - 20 mg/dL   Creatinine, Ser 0.99 0.61 - 1.24 mg/dL   Calcium 9.3 8.9 - 10.3 mg/dL   GFR calc non Af Amer >60 >60 mL/min   GFR calc Af Amer >60 >60 mL/min    Comment: (NOTE) The eGFR has been calculated using the CKD EPI equation. This calculation has not been validated in all clinical situations. eGFR's persistently <60 mL/min signify possible Chronic Kidney Disease.    Anion gap 11 5 - 15  Glucose, capillary     Status: Abnormal   Collection Time: 03/11/16  7:42 AM  Result Value Ref Range   Glucose-Capillary 219 (H) 65 - 99 mg/dL  CBG monitoring, ED     Status: None   Collection Time: 03/13/16 11:55 AM  Result Value Ref Range   Glucose-Capillary 84 65 - 99 mg/dL  Comprehensive metabolic panel     Status: Abnormal   Collection Time: 03/13/16 11:55 AM  Result Value Ref Range   Sodium 136 135 - 145 mmol/L   Potassium 3.9 3.5 - 5.1 mmol/L   Chloride 108 101 - 111 mmol/L   CO2 21 (L) 22 - 32 mmol/L   Glucose, Bld 97 65 - 99 mg/dL   BUN 17 6 - 20 mg/dL   Creatinine, Ser 1.56 (H) 0.61 - 1.24 mg/dL   Calcium 9.4 8.9 - 10.3 mg/dL   Total Protein 7.0 6.5 - 8.1 g/dL   Albumin 4.3 3.5 - 5.0 g/dL   AST 27 15 - 41 U/L   ALT 28 17 - 63 U/L   Alkaline Phosphatase 65 38 - 126 U/L   Total Bilirubin 0.9 0.3 - 1.2 mg/dL   GFR calc non Af Amer 44 (L) >60 mL/min   GFR calc Af Amer 51 (L) >60 mL/min    Comment: (NOTE) The eGFR has been calculated using the CKD EPI equation. This calculation has not been validated in all clinical situations. eGFR's persistently <60 mL/min signify possible Chronic Kidney Disease.    Anion gap 7 5 - 15  CBC with Differential     Status: Abnormal   Collection Time: 03/13/16 11:55 AM  Result Value Ref Range   WBC 11.4 (H) 4.0 - 10.5 K/uL   RBC 4.27 4.22 - 5.81 MIL/uL   Hemoglobin 13.4 13.0 -  17.0 g/dL   HCT 37.6 (L) 39.0 - 52.0 %   MCV 88.1  78.0 - 100.0 fL   MCH 31.4 26.0 - 34.0 pg   MCHC 35.6 30.0 - 36.0 g/dL   RDW 13.2 11.5 - 15.5 %   Platelets 252 150 - 400 K/uL   Neutrophils Relative % 60 %   Neutro Abs 6.7 1.7 - 7.7 K/uL   Lymphocytes Relative 30 %   Lymphs Abs 3.5 0.7 - 4.0 K/uL   Monocytes Relative 8 %   Monocytes Absolute 0.9 0.1 - 1.0 K/uL   Eosinophils Relative 2 %   Eosinophils Absolute 0.3 0.0 - 0.7 K/uL   Basophils Relative 0 %   Basophils Absolute 0.0 0.0 - 0.1 K/uL  Troponin I     Status: None   Collection Time: 03/13/16 11:55 AM  Result Value Ref Range   Troponin I <0.03 <0.031 ng/mL    Comment:        NO INDICATION OF MYOCARDIAL INJURY.   Lipase, blood     Status: None   Collection Time: 03/13/16 11:55 AM  Result Value Ref Range   Lipase 11 11 - 51 U/L  Brain natriuretic peptide     Status: None   Collection Time: 03/13/16 11:55 AM  Result Value Ref Range   B Natriuretic Peptide 24.8 0.0 - 100.0 pg/mL  I-Stat CG4 Lactic Acid, ED     Status: Abnormal   Collection Time: 03/13/16 12:04 PM  Result Value Ref Range   Lactic Acid, Venous 2.09 (HH) 0.5 - 2.0 mmol/L   Comment NOTIFIED PHYSICIAN   Culture, blood (routine x 2)     Status: None (Preliminary result)   Collection Time: 03/13/16 12:50 PM  Result Value Ref Range   Specimen Description BLOOD LEFT AC    Special Requests BOTTLES DRAWN AEROBIC AND ANAEROBIC 5ML    Culture      NO GROWTH 3 DAYS Performed at [facility]    Report Status PENDING   Culture, blood (routine x 2)     Status: None (Preliminary result)   Collection Time: 03/13/16 12:50 PM  Result Value Ref Range   Specimen Description BLOOD LEFT HAND    Special Requests BOTTLES DRAWN AEROBIC AND ANAEROBIC 2ML    Culture      NO GROWTH 3 DAYS Performed at [facility]    Report Status PENDING   Glucose, capillary     Status: Abnormal   Collection Time: 03/13/16  6:39 PM  Result Value Ref Range    Glucose-Capillary 126 (H) 65 - 99 mg/dL  MRSA PCR Screening     Status: None   Collection Time: 03/13/16  6:44 PM  Result Value Ref Range   MRSA by PCR NEGATIVE NEGATIVE    Comment:        The GeneXpert MRSA Assay (FDA approved for NASAL specimens only), is one component of a comprehensive MRSA colonization surveillance program. It is not intended to diagnose MRSA infection nor to guide or monitor treatment for MRSA infections.   HIV antibody     Status: None   Collection Time: 03/13/16  8:25 PM  Result Value Ref Range   HIV Screen 4th Generation wRfx Non Reactive Non Reactive    Comment: (NOTE) Performed At: [facility] [place], [place] [id number] Lindon Romp MD [id number]   Urinalysis, Routine w reflex microscopic     Status: Abnormal   Collection Time: 03/13/16  8:49 PM  Result Value Ref Range   Color, Urine YELLOW YELLOW   APPearance CLEAR CLEAR  Specific Gravity, Urine 1.011 1.005 - 1.030   pH 5.0 5.0 - 8.0   Glucose, UA 100 (A) NEGATIVE mg/dL   Hgb urine dipstick NEGATIVE NEGATIVE   Bilirubin Urine NEGATIVE NEGATIVE   Ketones, ur NEGATIVE NEGATIVE mg/dL   Protein, ur NEGATIVE NEGATIVE mg/dL   Nitrite NEGATIVE NEGATIVE   Leukocytes, UA NEGATIVE NEGATIVE    Comment: MICROSCOPIC NOT DONE ON URINES WITH NEGATIVE PROTEIN, BLOOD, LEUKOCYTES, NITRITE, OR GLUCOSE <1000 mg/dL.  Urine culture     Status: Abnormal   Collection Time: 03/13/16  8:49 PM  Result Value Ref Range   Specimen Description URINE, CLEAN CATCH    Special Requests NONE    Culture 1,000 COLONIES/mL INSIGNIFICANT GROWTH (A)    Report Status 03/15/2016 FINAL   Influenza panel by PCR (type A & B, H1N1)     Status: None   Collection Time: 03/13/16  8:49 PM  Result Value Ref Range   Influenza A By PCR NEGATIVE NEGATIVE   Influenza B By PCR NEGATIVE NEGATIVE   H1N1 flu by pcr NOT DETECTED NOT DETECTED    Comment:        The Xpert Flu assay (FDA approved for nasal  aspirates or washes and nasopharyngeal swab specimens), is intended as an aid in the diagnosis of influenza and should not be used as a sole basis for treatment.   Strep pneumoniae urinary antigen     Status: None   Collection Time: 03/13/16  8:49 PM  Result Value Ref Range   Strep Pneumo Urinary Antigen NEGATIVE NEGATIVE    Comment:        Infection due to S. pneumoniae cannot be absolutely ruled out since the antigen present may be below the detection limit of the test.   Glucose, capillary     Status: Abnormal   Collection Time: 03/14/16 12:40 AM  Result Value Ref Range   Glucose-Capillary 248 (H) 65 - 99 mg/dL  CBC     Status: Abnormal   Collection Time: 03/14/16  3:18 AM  Result Value Ref Range   WBC 8.1 4.0 - 10.5 K/uL   RBC 4.10 (L) 4.22 - 5.81 MIL/uL   Hemoglobin 12.6 (L) 13.0 - 17.0 g/dL   HCT 97.8 (L) 77.6 - 54.8 %   MCV 87.1 78.0 - 100.0 fL   MCH 30.7 26.0 - 34.0 pg   MCHC 35.3 30.0 - 36.0 g/dL   RDW 68.8 52.0 - 74.0 %   Platelets 167 150 - 400 K/uL  Basic metabolic panel     Status: Abnormal   Collection Time: 03/14/16  3:18 AM  Result Value Ref Range   Sodium 140 135 - 145 mmol/L   Potassium 3.9 3.5 - 5.1 mmol/L   Chloride 107 101 - 111 mmol/L   CO2 22 22 - 32 mmol/L   Glucose, Bld 225 (H) 65 - 99 mg/dL   BUN 13 6 - 20 mg/dL   Creatinine, Ser 9.79 0.61 - 1.24 mg/dL   Calcium 9.3 8.9 - 64.1 mg/dL   GFR calc non Af Amer >60 >60 mL/min   GFR calc Af Amer >60 >60 mL/min    Comment: (NOTE) The eGFR has been calculated using the CKD EPI equation. This calculation has not been validated in all clinical situations. eGFR's persistently <60 mL/min signify possible Chronic Kidney Disease.    Anion gap 11 5 - 15  Glucose, capillary     Status: Abnormal   Collection Time: 03/14/16  6:48 AM  Result Value  Ref Range   Glucose-Capillary 174 (H) 65 - 99 mg/dL  Glucose, capillary     Status: Abnormal   Collection Time: 03/14/16 11:41 AM  Result Value Ref Range    Glucose-Capillary 147 (H) 65 - 99 mg/dL   Comment 1 Notify RN   Glucose, capillary     Status: Abnormal   Collection Time: 03/14/16  4:27 PM  Result Value Ref Range   Glucose-Capillary 126 (H) 65 - 99 mg/dL   Comment 1 Notify RN   Glucose, capillary     Status: Abnormal   Collection Time: 03/14/16  8:50 PM  Result Value Ref Range   Glucose-Capillary 318 (H) 65 - 99 mg/dL   Comment 1 Notify RN    Comment 2 Document in Chart   Cortisol     Status: None   Collection Time: 03/15/16  4:17 AM  Result Value Ref Range   Cortisol, Plasma 11.4 ug/dL    Comment: (NOTE) AM    6.7 - 22.6 ug/dL PM   <10.0       ug/dL   Glucose, capillary     Status: Abnormal   Collection Time: 03/15/16  6:12 AM  Result Value Ref Range   Glucose-Capillary 191 (H) 65 - 99 mg/dL   Comment 1 Notify RN    Comment 2 Document in Chart   Glucose, capillary     Status: Abnormal   Collection Time: 03/15/16 11:17 AM  Result Value Ref Range   Glucose-Capillary 188 (H) 65 - 99 mg/dL   Comment 1 Notify RN   Glucose, capillary     Status: Abnormal   Collection Time: 03/15/16  4:45 PM  Result Value Ref Range   Glucose-Capillary 280 (H) 65 - 99 mg/dL   Comment 1 Notify RN   Glucose, capillary     Status: Abnormal   Collection Time: 03/15/16  9:15 PM  Result Value Ref Range   Glucose-Capillary 348 (H) 65 - 99 mg/dL  Glucose, capillary     Status: Abnormal   Collection Time: 03/16/16  5:44 AM  Result Value Ref Range   Glucose-Capillary 272 (H) 65 - 99 mg/dL  Glucose, capillary     Status: Abnormal   Collection Time: 03/16/16 11:48 AM  Result Value Ref Range   Glucose-Capillary 244 (H) 65 - 99 mg/dL   Comment 1 Notify RN   Glucose, capillary     Status: Abnormal   Collection Time: 03/16/16  4:25 PM  Result Value Ref Range   Glucose-Capillary 206 (H) 65 - 99 mg/dL   Comment 1 Notify RN     Assessment/Plan: 1. Orthostatic hypotension Patient is holding medications and wearing TED hose as directed. BP still low  end of normal. Lightheadedness still noted with standing. OVS + in office. Will repeat labs. Will begin Midodrine to help stabilize BP. FU with cardiology as scheduled. FU scheduled with me in a few days for reassessment. ER if anything worsens. - CBC w/Diff - Comp Met (CMET)  2. Seizure-like activity [facility]) Patient scheduled for outpatient EEG and follow-up with Neurology. No repeat episodes since discharge. Again CT/MRA unremarkable for stroke or mass changes.  - CBC w/Diff - Comp Met (CMET)

## 2016-03-17 NOTE — Telephone Encounter (Signed)
Caller name: Doren Custard Relation to pt: self  Call back number: [phone] Pharmacy: [facility]   Reason for call: Pt was seen today stated was going to receive 2 prescription at the pharmacy, the pharmacist only had one, pt states was told will receive his ondansetron (ZOFRAN-ODT) 4 MG disintegrating tablet during his visit today, but went to pharmacy and rx was not sent to pharmacy, pt is needing it, since pt is here does not want to leave with out prescription. Please advise.

## 2016-03-17 NOTE — Telephone Encounter (Signed)
I have resent medication. Apologies for any inconvenience.

## 2016-03-17 NOTE — Telephone Encounter (Signed)
Pt was informed the below. Thanks

## 2016-03-17 NOTE — Patient Instructions (Signed)
Please go to the lab for blood work. I will call with results.  Please start the Midodrine three times daily as directed to keep BP stable when standing and to prevent lightheadedness.  Increase fluids. Add on a small G2 Gatorade twice daily. Increase salt intake to help with BP.  Follow-up with [place] on Monday.  If anything worsens or you have another episode, you have to go to the ER.

## 2016-03-17 NOTE — Progress Notes (Signed)
Quick Note:  Notify essentially normal sleep study ______

## 2016-03-18 ENCOUNTER — Other Ambulatory Visit: Payer: Self-pay | Admitting: *Deleted

## 2016-03-18 ENCOUNTER — Ambulatory Visit (INDEPENDENT_AMBULATORY_CARE_PROVIDER_SITE_OTHER): Payer: Commercial Managed Care - HMO | Admitting: Neurology

## 2016-03-18 DIAGNOSIS — R6889 Other general symptoms and signs: Secondary | ICD-10-CM

## 2016-03-18 DIAGNOSIS — IMO0001 Reserved for inherently not codable concepts without codable children: Secondary | ICD-10-CM

## 2016-03-18 LAB — CULTURE, BLOOD (ROUTINE X 2)
CULTURE: NO GROWTH
CULTURE: NO GROWTH

## 2016-03-18 NOTE — Procedures (Signed)
ELECTROENCEPHALOGRAM REPORT  Date of Study: 03/18/2016  Patient's Name: [name] [name] MRN: [id number] Date of Birth: April 01, 1946  Referring Provider: Reyne Dumas, MD  Indication: Patient is a 70 year old male with history of OSA, HTN, DM, hyperlipidemia, RBBB, bipolar disorder, CAD and syncope fo rwhich he was hospitalized 03/13/16 along with SOB. He has been hospitalized twice for this and episodes of lightheadedness, confusion and slurred speech with a right facial droop and left hand tremors lasting 30 minutes.After the spell the confusion continues for another 15 minutes. The episodes are worsening over past month.  He has hisotry of concussions  Medications: Gabapentin zoloft topamax Ranexa Mirapex Levothyroxine Zanaflex  Technical Summary: This is a multichannel digital EEG recording, using the international 10-20 placement system with electrodes applied with paste and impedances below 5000 ohms.    Description: The EEG background is symmetric, with a well-developed posterior dominant rhythm of 8-9 Hz, which is reactive to eye opening and closing.  Diffuse beta activity is seen, with a bilateral frontal preponderance.  No focal or generalized abnormalities are seen.  No focal or generalized epileptiform discharges are seen.  Stage II sleep is seen, with normal and symmetric sleep patterns.  Hyperventilation and photic stimulation were performed, and produced no abnormalities.  ECG revealed normal cardiac rate and rhythm.  Impression: This is a normal routine EEG of the awake and asleep states, with activating procedures.  A normal study does not rule out the possibility of a seizure disorder in this patient.  Madelene Kaatz R. [name], DO

## 2016-03-19 ENCOUNTER — Ambulatory Visit (INDEPENDENT_AMBULATORY_CARE_PROVIDER_SITE_OTHER): Payer: Commercial Managed Care - HMO

## 2016-03-19 ENCOUNTER — Other Ambulatory Visit: Payer: Self-pay | Admitting: *Deleted

## 2016-03-19 DIAGNOSIS — R55 Syncope and collapse: Secondary | ICD-10-CM | POA: Diagnosis not present

## 2016-03-19 NOTE — Patient Outreach (Signed)
Second attempt made to contact member at preferred number listed in chart [phone]).  No answer, HIPPA compliant voice message left, will await call back.  Will make 3rd attempt next week, trying preferred number and alternate number.  If no answer, will send outreach letter.  Valente David, BSN, [place] [facility]  [facility] Care Manager [phone]

## 2016-03-21 NOTE — Assessment & Plan Note (Signed)
hgba1c unacceptable, minimize simple carbs. Increase exercise as tolerated. Continue current meds has just started on Tuojeo and is tolerating well

## 2016-03-21 NOTE — Assessment & Plan Note (Signed)
Presented to the ER with mental status changes, confusion but work up for CVA was negative. He has a follow up with neurology next week.

## 2016-03-21 NOTE — Assessment & Plan Note (Signed)
On Levothyroxine, continue to monitor 

## 2016-03-21 NOTE — Assessment & Plan Note (Signed)
Well controlled, no changes to meds. Encouraged heart healthy diet such as the DASH diet and exercise as tolerated.  °

## 2016-03-21 NOTE — Progress Notes (Signed)
Patient ID: [name] [name], male   DOB: August 25, 1946, 70 y.o.   MRN: [id number]   Subjective:    Patient ID: [name] [name], male    DOB: [birth year], 69 y.o.   MRN: [id number]  Chief Complaint  Patient presents with  . Follow-up    1 month    HPI Patient is in today for follow up. Is feeling better today but had to prevent to ER with confusion and mental status changes. He is feeling better today but has still felt fatigues and weak. Denies CP/palp/SOB/HA/congestion/fevers/GI or GU c/o. Taking meds as prescribed. Has started his Tuojeo just in past day.   Past Medical History  Diagnosis Date  . Diabetes mellitus type II, uncontrolled ([place])   . OSA (obstructive sleep apnea)   . Hyperlipidemia   . RBBB (right bundle branch block)   . Depression     Bipolar  . Edema   . Hypertension   . Falling episodes     these have occurred in the past and again recurring 2011  . Spondylosis     C5-6, C6-7 MRI 2010  . CAD (coronary artery disease)     A. CABG in 2000,status post cardiac cath in 2006, 2009 ....continued chest pain and SOB despite oral medication adjestments including Ranexa. B. Cath November 2009/ mRCA - 2.75 x 23 Abbott Xience V drug-eluting stent ...11/26/2008 to distal  RCA leading to acute marginal.  C. Cath 07/2012 for CP - stable anatomy, med rx. d. cath 2015 and 05/30/2015 stable anatomy, consider Myoview if has CP again  . Hx of CABG     2000,  / one median sternotomy suture broken her chest x-ray November, 2010, no clinical significance  . Palpitations     event recorder showed sinus rhythm  . Nephrolithiasis   . Restless leg   . Dizziness   . Shortness of breath     CPX April, 2011, mild functional limitation, no clear pulmonary or cardiac limitation, possible deconditioning and mild chronotropic incompetence( peak heart rate 130)  . Cerebral ischemia     MRI November, 2010, chronic microvascular ischemia  . Anemia     hemoglobin 7.4, iron deficiency, January, 2011,  2 unit transfusion, endoscopy normal, capsule endoscopy February, 2011 no small bowel abnormalities.   Most likely source gastric erosions, followed by GI  . H/O medication noncompliance     Due to loss of insurance  . Ejection fraction     EF 60%, echo, July 31, 2012  . Diabetes mellitus without complication ([place])   . Pain in limb 06/12/2009    Qualifier: Diagnosis of  By: [name]   . Low back pain 06/12/2009    Qualifier: Diagnosis of  By: [name]   . Thyroid disease   . Right knee pain 01/07/2015  . Advance care planning 05/01/2015  . ACP (advance care planning) 05/11/2015  . Nausea with vomiting 01/25/2016  . Headache 01/25/2016  . Diarrhea 02/15/2016  . Gastric ulcer   . Tubulovillous adenoma of colon 2007  . Iron deficiency anemia   . Syncope 03/2016  . Chronic diastolic CHF (congestive heart failure) ([place]) 03/13/2016    Past Surgical History  Procedure Laterality Date  . Nasal septum surgery      UP3  . Coronary artery bypass graft      2000  . Left heart catheterization with coronary/graft angiogram N/A 08/01/2012    Procedure: LEFT HEART CATHETERIZATION WITH CORONARY/GRAFT ANGIOGRAM;  Surgeon:  [name], MD;  Location: [facility] CATH LAB;  Service: Cardiovascular;  Laterality: N/A;  . Left heart catheterization with coronary/graft angiogram N/A 01/03/2015    Procedure: LEFT HEART CATHETERIZATION WITH Beatrix Fetters;  Surgeon: [name], MD;  Location: [facility] CATH LAB;  Service: Cardiovascular;  Laterality: N/A;  . Percutaneous coronary stent intervention (pci-s)  10/2008    mRCA PCI  2.75 x 23 Abbott Xience V drug-eluting stent   . Cardiac catheterization N/A 05/30/2015    Procedure: Left Heart Cath and Coronary Angiography;  Surgeon: [name], MD;  Location: [facility] CV LAB;  Service: Cardiovascular;  Laterality: N/A;    Family History  Problem Relation Age of Onset  . Cancer Brother     pancreatic cancer  . Diabetes Brother     4   .  Stroke Brother   . Diabetes Brother   . Cancer Brother   . Diabetes Brother   . Coronary artery disease Brother   . Diabetes Brother   . Hypothyroidism Brother   . Heart attack      Nephew  . Diabetes Mother   . Heart failure Mother   . Heart failure Father   . Irregular heart beat Daughter   . Cancer Maternal Grandmother     unknown     Social History   Social History  . Marital Status: Married    Spouse Name: N/A  . Number of Children: 4  . Years of Education: 13   Occupational History  . retired    Social History Main Topics  . Smoking status: Never Smoker   . Smokeless tobacco: Never Used  . Alcohol Use: No     Comment: stopped drinking in 1998  . Drug Use: No  . Sexual Activity:    Partners: Female   Other Topics Concern  . Not on file   Social History Narrative   Patient is right handed.   Patient does not drink caffeine.    Outpatient Prescriptions Prior to Visit  Medication Sig Dispense Refill  . atorvastatin (LIPITOR) 40 MG tablet Take 1 tablet (40 mg total) by mouth daily at 6 PM. (Patient taking differently: Take 40 mg by mouth daily. ) 30 tablet 5  . Cholecalciferol (DIALYVITE VITAMIN D3 MAX) 91478 units TABS Take 1 tablet by mouth once a week. (Patient taking differently: Take 1 tablet by mouth once a week. Wednesdays) 12 tablet 1  . clotrimazole-betamethasone (LOTRISONE) cream APPLY TO THE AFFECTED TWICE DAILY (Patient taking differently: Apply 1 application topically 2 (two) times daily as needed (dry skin). ) 30 g 6  . diclofenac sodium (VOLTAREN) 1 % GEL Apply 2 g topically 3 (three) times daily as needed. For pain to affected area (Patient taking differently: Apply 1 application topically 3 (three) times daily as needed (pain). ) 2 Tube 3  . gabapentin (NEURONTIN) 300 MG capsule 2 capsules in AM, 1 capsule at noon, 2 capsules in PM daily (Patient taking differently: Take 300-600 mg by mouth 3 (three) times daily. Take 2 capsules (600 mg) every  morning, 1 capsule (300 mg) at noon, and 2 capsules (600 mg) at night) 150 capsule 3  . glucose blood (ACCU-CHEK AVIVA) test strip Use as directed to check blood sugar 4 times daily.  DX E11.9 200 each 6  . insulin aspart (NOVOLOG) 100 UNIT/ML injection Inject 10-17 Units into the skin 3 (three) times daily with meals. Use 10-17 unit 3 times daily before meals (Patient taking differently: Inject  10-17 Units into the skin 3 (three) times daily with meals. Per sliding scale) 15 mL 4  . Insulin Glargine (TOUJEO SOLOSTAR) 300 UNIT/ML SOPN Inject 42 Units into the skin at bedtime. 3 pen 4  . levothyroxine (SYNTHROID, LEVOTHROID) 75 MCG tablet Take 1 tablet (75 mcg total) by mouth daily. 90 tablet 2  . metFORMIN (GLUCOPHAGE) 1000 MG tablet Take 1 tablet (1,000 mg total) by mouth 2 (two) times daily with a meal. 60 tablet 5  . nitroGLYCERIN (NITROSTAT) 0.4 MG SL tablet Place 1 tablet (0.4 mg total) under the tongue every 5 (five) minutes as needed (up to 3 doses). (Patient taking differently: Place 0.4 mg under the tongue every 5 (five) minutes as needed for chest pain (up to 3 doses). ) 25 tablet 4  . pantoprazole (PROTONIX) 40 MG tablet Take 40 mg by mouth daily.    . sertraline (ZOLOFT) 100 MG tablet Take 1 tablet (100 mg total) by mouth 2 (two) times daily. 180 tablet 1  . tiZANidine (ZANAFLEX) 4 MG tablet Take 1 tablet (4 mg total) by mouth every 8 (eight) hours as needed for muscle spasms. 90 tablet 2  . topiramate (TOPAMAX) 50 MG tablet Take 1 tablet (50 mg total) by mouth at bedtime. (Patient not taking: Reported on 03/17/2016) 30 tablet 3  . aspirin EC 81 MG tablet Take 1 tablet (81 mg total) by mouth daily. 90 tablet 3  . hyoscyamine (ANASPAZ) 0.125 MG TBDP disintergrating tablet Place 1 tablet (0.125 mg total) under the tongue every 4 (four) hours as needed for cramping. (Patient not taking: Reported on 03/13/2016) 25 tablet 2  . insulin glargine (LANTUS) 100 UNIT/ML injection Inject 42 Units into the  skin at bedtime.    . isosorbide mononitrate (IMDUR) 120 MG 24 hr tablet Take 1 tablet (120 mg total) by mouth daily. (Patient not taking: Reported on 03/13/2016) 90 tablet 0  . metoprolol (LOPRESSOR) 25 MG tablet Take 0.5 tablets (12.5 mg total) by mouth 2 (two) times daily. 60 tablet 1  . ondansetron (ZOFRAN-ODT) 4 MG disintegrating tablet Take 1 tablet (4 mg total) by mouth every 8 (eight) hours as needed for nausea or vomiting. (Patient not taking: Reported on 03/13/2016) 20 tablet 0  . pramipexole (MIRAPEX) 0.5 MG tablet Take 1-2 tablets (0.5-1 mg total) by mouth at bedtime. (Patient not taking: Reported on 03/13/2016) 60 tablet 2   No facility-administered medications prior to visit.    Allergies  Allergen Reactions  . Morphine Other (See Comments)    hallucinations  . Shellfish-Derived Products Itching    Review of Systems  Constitutional: Positive for malaise/fatigue. Negative for fever.  HENT: Negative for congestion.   Eyes: Negative for blurred vision.  Respiratory: Negative for shortness of breath.   Cardiovascular: Negative for chest pain, palpitations, leg swelling and PND.  Gastrointestinal: Negative for nausea, abdominal pain and blood in stool.  Genitourinary: Positive for frequency. Negative for dysuria.  Musculoskeletal: Negative for falls.  Skin: Negative for rash.  Neurological: Positive for dizziness and weakness. Negative for loss of consciousness and headaches.  Endo/Heme/Allergies: Negative for environmental allergies.  Psychiatric/Behavioral: Negative for depression. The patient is not nervous/anxious.        Objective:    Physical Exam  Constitutional: He is oriented to person, place, and time. He appears well-developed and well-nourished. No distress.  HENT:  Head: Normocephalic and atraumatic.  Eyes: Conjunctivae are normal.  Neck: Neck supple. No thyromegaly present.  Cardiovascular: Normal rate, regular rhythm and normal  heart sounds.   No murmur  heard. Pulmonary/Chest: Effort normal and breath sounds normal. No respiratory distress. He has no wheezes.  Abdominal: Soft. Bowel sounds are normal. He exhibits no mass. There is no tenderness.  Musculoskeletal: He exhibits no edema.  Lymphadenopathy:    He has no cervical adenopathy.  Neurological: He is alert and oriented to person, place, and time.  Skin: Skin is warm and dry.  Psychiatric: He has a normal mood and affect. His behavior is normal.    BP 132/71 mmHg  Pulse 65  Temp(Src) 98.7 F (37.1 C) (Oral)  Ht 5\' 9"  (1.753 m)  Wt 180 lb 4 oz (81.761 kg)  BMI 26.61 kg/m2  SpO2 100% Wt Readings from Last 3 Encounters:  03/17/16 178 lb 12.8 oz (81.103 kg)  03/16/16 171 lb 8 oz (77.792 kg)  03/11/16 174 lb 12.8 oz (79.289 kg)     Lab Results  Component Value Date   WBC 10.6* 03/17/2016   HGB 14.5 03/17/2016   HCT 42.4 03/17/2016   PLT 215.0 03/17/2016   GLUCOSE 363* 03/17/2016   CHOL 75 02/09/2016   TRIG 244.0* 02/09/2016   HDL 27.20* 02/09/2016   LDLDIRECT 25.0 02/09/2016   LDLCALC 107* 05/01/2015   ALT 28 03/17/2016   AST 22 03/17/2016   NA 134* 03/17/2016   K 4.4 03/17/2016   CL 100 03/17/2016   CREATININE 1.12 03/17/2016   BUN 17 03/17/2016   CO2 26 03/17/2016   TSH 5.287* 03/10/2016   PSA 2.39 11/25/2010   INR 1.08 11/25/2015   HGBA1C 10.0* 03/10/2016   MICROALBUR 1.1 05/01/2015    Lab Results  Component Value Date   TSH 5.287* 03/10/2016   Lab Results  Component Value Date   WBC 10.6* 03/17/2016   HGB 14.5 03/17/2016   HCT 42.4 03/17/2016   MCV 90.3 03/17/2016   PLT 215.0 03/17/2016   Lab Results  Component Value Date   NA 134* 03/17/2016   K 4.4 03/17/2016   CO2 26 03/17/2016   GLUCOSE 363* 03/17/2016   BUN 17 03/17/2016   CREATININE 1.12 03/17/2016   BILITOT 0.7 03/17/2016   ALKPHOS 86 03/17/2016   AST 22 03/17/2016   ALT 28 03/17/2016   PROT 7.6 03/17/2016   ALBUMIN 4.7 03/17/2016   CALCIUM 10.0 03/17/2016   ANIONGAP 11  03/14/2016   GFR 69.40 03/17/2016   Lab Results  Component Value Date   CHOL 75 02/09/2016   Lab Results  Component Value Date   HDL 27.20* 02/09/2016   Lab Results  Component Value Date   LDLCALC 107* 05/01/2015   Lab Results  Component Value Date   TRIG 244.0* 02/09/2016   Lab Results  Component Value Date   CHOLHDL 3 02/09/2016   Lab Results  Component Value Date   HGBA1C 10.0* 03/10/2016       Assessment & Plan:   Problem List Items Addressed This Visit    Diabetes mellitus type II, uncontrolled ([place]) (Chronic)    hgba1c unacceptable, minimize simple carbs. Increase exercise as tolerated. Continue current meds has just started on Tuojeo and is tolerating well      Essential hypertension (Chronic)    Well controlled, no changes to meds. Encouraged heart healthy diet such as the DASH diet and exercise as tolerated.       Hypothyroidism    On Levothyroxine, continue to monitor      Near syncope - Primary    Presented to the ER with mental  status changes, confusion but work up for CVA was negative. He has a follow up with neurology next week.         I am having Mr. Reetz maintain his topiramate, diclofenac sodium, atorvastatin, metFORMIN, sertraline, pantoprazole, Insulin Glargine, tiZANidine, Cholecalciferol, clotrimazole-betamethasone, gabapentin, insulin aspart, levothyroxine, nitroGLYCERIN, and glucose blood.  No orders of the defined types were placed in this encounter.     Penni Homans, MD

## 2016-03-22 ENCOUNTER — Emergency Department (HOSPITAL_BASED_OUTPATIENT_CLINIC_OR_DEPARTMENT_OTHER): Payer: Commercial Managed Care - HMO

## 2016-03-22 ENCOUNTER — Other Ambulatory Visit: Payer: Self-pay | Admitting: *Deleted

## 2016-03-22 ENCOUNTER — Telehealth: Payer: Self-pay | Admitting: Family Medicine

## 2016-03-22 ENCOUNTER — Emergency Department (HOSPITAL_BASED_OUTPATIENT_CLINIC_OR_DEPARTMENT_OTHER)
Admission: EM | Admit: 2016-03-22 | Discharge: 2016-03-22 | Disposition: A | Discharge status: Home / Self Care | Payer: Commercial Managed Care - HMO | Attending: Emergency Medicine | Admitting: Emergency Medicine

## 2016-03-22 ENCOUNTER — Encounter (HOSPITAL_BASED_OUTPATIENT_CLINIC_OR_DEPARTMENT_OTHER): Payer: Self-pay

## 2016-03-22 ENCOUNTER — Ambulatory Visit (INDEPENDENT_AMBULATORY_CARE_PROVIDER_SITE_OTHER): Payer: Commercial Managed Care - HMO | Admitting: Physician Assistant

## 2016-03-22 ENCOUNTER — Encounter: Payer: Self-pay | Admitting: Physician Assistant

## 2016-03-22 VITALS — BP 78/58 | HR 87 | Temp 98.2°F | Ht 69.0 in | Wt 177.4 lb

## 2016-03-22 DIAGNOSIS — I951 Orthostatic hypotension: Secondary | ICD-10-CM | POA: Diagnosis not present

## 2016-03-22 DIAGNOSIS — Z87442 Personal history of urinary calculi: Secondary | ICD-10-CM | POA: Diagnosis not present

## 2016-03-22 DIAGNOSIS — Z8673 Personal history of transient ischemic attack (TIA), and cerebral infarction without residual deficits: Secondary | ICD-10-CM | POA: Diagnosis not present

## 2016-03-22 DIAGNOSIS — G4733 Obstructive sleep apnea (adult) (pediatric): Secondary | ICD-10-CM | POA: Diagnosis not present

## 2016-03-22 DIAGNOSIS — Z794 Long term (current) use of insulin: Secondary | ICD-10-CM | POA: Diagnosis not present

## 2016-03-22 DIAGNOSIS — F319 Bipolar disorder, unspecified: Secondary | ICD-10-CM | POA: Diagnosis not present

## 2016-03-22 DIAGNOSIS — Z862 Personal history of diseases of the blood and blood-forming organs and certain disorders involving the immune mechanism: Secondary | ICD-10-CM | POA: Diagnosis not present

## 2016-03-22 DIAGNOSIS — Z9119 Patient's noncompliance with other medical treatment and regimen: Secondary | ICD-10-CM | POA: Diagnosis not present

## 2016-03-22 DIAGNOSIS — Z9889 Other specified postprocedural states: Secondary | ICD-10-CM | POA: Diagnosis not present

## 2016-03-22 DIAGNOSIS — E785 Hyperlipidemia, unspecified: Secondary | ICD-10-CM | POA: Insufficient documentation

## 2016-03-22 DIAGNOSIS — Z7984 Long term (current) use of oral hypoglycemic drugs: Secondary | ICD-10-CM | POA: Insufficient documentation

## 2016-03-22 DIAGNOSIS — E119 Type 2 diabetes mellitus without complications: Secondary | ICD-10-CM | POA: Diagnosis not present

## 2016-03-22 DIAGNOSIS — Z8719 Personal history of other diseases of the digestive system: Secondary | ICD-10-CM | POA: Insufficient documentation

## 2016-03-22 DIAGNOSIS — Z951 Presence of aortocoronary bypass graft: Secondary | ICD-10-CM | POA: Diagnosis not present

## 2016-03-22 DIAGNOSIS — I5032 Chronic diastolic (congestive) heart failure: Secondary | ICD-10-CM | POA: Insufficient documentation

## 2016-03-22 DIAGNOSIS — E079 Disorder of thyroid, unspecified: Secondary | ICD-10-CM | POA: Diagnosis not present

## 2016-03-22 DIAGNOSIS — G2581 Restless legs syndrome: Secondary | ICD-10-CM | POA: Diagnosis not present

## 2016-03-22 DIAGNOSIS — R55 Syncope and collapse: Secondary | ICD-10-CM | POA: Diagnosis present

## 2016-03-22 DIAGNOSIS — Z7902 Long term (current) use of antithrombotics/antiplatelets: Secondary | ICD-10-CM | POA: Insufficient documentation

## 2016-03-22 DIAGNOSIS — I10 Essential (primary) hypertension: Secondary | ICD-10-CM | POA: Diagnosis not present

## 2016-03-22 DIAGNOSIS — I2510 Atherosclerotic heart disease of native coronary artery without angina pectoris: Secondary | ICD-10-CM | POA: Diagnosis not present

## 2016-03-22 DIAGNOSIS — Z79899 Other long term (current) drug therapy: Secondary | ICD-10-CM | POA: Diagnosis not present

## 2016-03-22 LAB — CBC WITH DIFFERENTIAL/PLATELET
BASOS ABS: 0 10*3/uL (ref 0.0–0.1)
BASOS PCT: 0 %
EOS ABS: 0.2 10*3/uL (ref 0.0–0.7)
Eosinophils Relative: 2 %
HCT: 36.8 % — ABNORMAL LOW (ref 39.0–52.0)
HEMOGLOBIN: 13.1 g/dL (ref 13.0–17.0)
Lymphocytes Relative: 23 %
Lymphs Abs: 2.2 10*3/uL (ref 0.7–4.0)
MCH: 31.6 pg (ref 26.0–34.0)
MCHC: 35.6 g/dL (ref 30.0–36.0)
MCV: 88.9 fL (ref 78.0–100.0)
Monocytes Absolute: 0.7 10*3/uL (ref 0.1–1.0)
Monocytes Relative: 7 %
NEUTROS PCT: 68 %
Neutro Abs: 6.5 10*3/uL (ref 1.7–7.7)
Platelets: 208 10*3/uL (ref 150–400)
RBC: 4.14 MIL/uL — AB (ref 4.22–5.81)
RDW: 13.4 % (ref 11.5–15.5)
WBC: 9.6 10*3/uL (ref 4.0–10.5)

## 2016-03-22 LAB — COMPREHENSIVE METABOLIC PANEL
ALBUMIN: 4.3 g/dL (ref 3.5–5.0)
ALT: 28 U/L (ref 17–63)
ANION GAP: 8 (ref 5–15)
AST: 23 U/L (ref 15–41)
Alkaline Phosphatase: 78 U/L (ref 38–126)
BUN: 18 mg/dL (ref 6–20)
CHLORIDE: 102 mmol/L (ref 101–111)
CO2: 24 mmol/L (ref 22–32)
CREATININE: 1.12 mg/dL (ref 0.61–1.24)
Calcium: 9.5 mg/dL (ref 8.9–10.3)
GFR calc non Af Amer: >60 mL/min (ref >60)
GLUCOSE: 241 mg/dL — AB (ref 65–99)
Potassium: 4.4 mmol/L (ref 3.5–5.1)
SODIUM: 134 mmol/L — AB (ref 135–145)
Total Bilirubin: 0.8 mg/dL (ref 0.3–1.2)
Total Protein: 7.1 g/dL (ref 6.5–8.1)

## 2016-03-22 LAB — TROPONIN I

## 2016-03-22 LAB — AMMONIA: AMMONIA: 11 umol/L (ref 9–35)

## 2016-03-22 MED ORDER — SODIUM CHLORIDE 0.9 % IV BOLUS (SEPSIS)
1000.0000 mL | Freq: Once | INTRAVENOUS | Status: AC
  Administered 2016-03-22: 1000 mL via INTRAVENOUS

## 2016-03-22 MED ORDER — IOPAMIDOL (ISOVUE-370) INJECTION 76%
100.0000 mL | Freq: Once | INTRAVENOUS | Status: AC | PRN
  Administered 2016-03-22: 100 mL via INTRAVENOUS

## 2016-03-22 NOTE — Telephone Encounter (Signed)
Called in [facility] and spoke with [place] and let her know about the metoprolol.

## 2016-03-22 NOTE — ED Notes (Signed)
Pt sent from PCP in building for hypotension-pt NAD

## 2016-03-22 NOTE — Telephone Encounter (Signed)
Caller name: Tillie Rung, PT with [facility] Can be reached: [phone]   Reason for call: She would like orders for PT 1x week for 1 week, 1x week for 4 weeks due to low BP and syncope. She is also needing clarification on metoprolol. She said pt discharge papers reflect dc metoprolol x 3 days. Pt wife states that he is supposed to d/c metoprolol altogether.

## 2016-03-22 NOTE — Patient Outreach (Signed)
[facility]) Care Management  03/22/2016  [place] Lant Feb 21, 1946 [id number]   Patient triggered RED on EMMI Heart Failure dashboard, notification sent to Valente David, RN.  Thanks, Ronnell [place]. [place], [place] Assistant Phone: [phone] Fax: [phone]

## 2016-03-22 NOTE — ED Notes (Signed)
MD at bedside. 

## 2016-03-22 NOTE — Patient Outreach (Signed)
Notified by care management assistant the member triggered red on EMMI dashboard for heart failure.  As preparing to contact member, noted that he is currently in the emergency department for hypotension.  According to current notes, the member is anticipating discharge.  Will attempt to contact member tomorrow.  Valente David, BSN, [place] Management  [facility] Care Manager [phone]

## 2016-03-22 NOTE — ED Notes (Signed)
Patient returned from CT

## 2016-03-22 NOTE — Discharge Instructions (Signed)
Follow-up with neurology tomorrow as scheduled.  Return to the emergency department for any new and concerning symptoms.   Syncope Syncope is a medical term for fainting or passing out. This means you lose consciousness and drop to the ground. People are generally unconscious for less than 5 minutes. You may have some muscle twitches for up to 15 seconds before waking up and returning to normal. Syncope occurs more often in older adults, but it can happen to anyone. While most causes of syncope are not dangerous, syncope can be a sign of a serious medical problem. It is important to seek medical care.  CAUSES  Syncope is caused by a sudden drop in blood flow to the brain. The specific cause is often not determined. Factors that can bring on syncope include:  Taking medicines that lower blood pressure.  Sudden changes in posture, such as standing up quickly.  Taking more medicine than prescribed.  Standing in one place for too long.  Seizure disorders.  Dehydration and excessive exposure to heat.  Low blood sugar (hypoglycemia).  Straining to have a bowel movement.  Heart disease, irregular heartbeat, or other circulatory problems.  Fear, emotional distress, seeing blood, or severe pain. SYMPTOMS  Right before fainting, you may:  Feel dizzy or light-headed.  Feel nauseous.  See all white or all black in your field of vision.  Have cold, clammy skin. DIAGNOSIS  Your health care provider will ask about your symptoms, perform a physical exam, and perform an electrocardiogram (ECG) to record the electrical activity of your heart. Your health care provider may also perform other heart or blood tests to determine the cause of your syncope which may include:  Transthoracic echocardiogram (TTE). During echocardiography, sound waves are used to evaluate how blood flows through your heart.  Transesophageal echocardiogram (TEE).  Cardiac monitoring. This allows your health care  provider to monitor your heart rate and rhythm in real time.  Holter monitor. This is a portable device that records your heartbeat and can help diagnose heart arrhythmias. It allows your health care provider to track your heart activity for several days, if needed.  Stress tests by exercise or by giving medicine that makes the heart beat faster. TREATMENT  In most cases, no treatment is needed. Depending on the cause of your syncope, your health care provider may recommend changing or stopping some of your medicines. HOME CARE INSTRUCTIONS  Have someone stay with you until you feel stable.  Do not drive, use machinery, or play sports until your health care provider says it is okay.  Keep all follow-up appointments as directed by your health care provider.  Lie down right away if you start feeling like you might faint. Breathe deeply and steadily. Wait until all the symptoms have passed.  Drink enough fluids to keep your urine clear or pale yellow.  If you are taking blood pressure or heart medicine, get up slowly and take several minutes to sit and then stand. This can reduce dizziness. SEEK IMMEDIATE MEDICAL CARE IF:   You have a severe headache.  You have unusual pain in the chest, abdomen, or back.  You are bleeding from your mouth or rectum, or you have black or tarry stool.  You have an irregular or very fast heartbeat.  You have pain with breathing.  You have repeated fainting or seizure-like jerking during an episode.  You faint when sitting or lying down.  You have confusion.  You have trouble walking.  You have severe  weakness.  You have vision problems. If you fainted, call your local emergency services (911 in [place] Do not drive yourself to the hospital.    This information is not intended to replace advice given to you by your health care provider. Make sure you discuss any questions you have with your health care provider.   Document Released: 11/22/2005  Document Revised: 04/08/2015 Document Reviewed: 01/21/2012 Elsevier Interactive Patient Education [employer].

## 2016-03-22 NOTE — ED Notes (Signed)
Patient transported to CT 

## 2016-03-22 NOTE — Progress Notes (Signed)
Pre visit review using our clinic review tool, if applicable. No additional management support is needed unless otherwise documented below in the visit note. 

## 2016-03-22 NOTE — ED Provider Notes (Signed)
CSN: [id number]     Arrival date & time 03/22/16  1156 History   First MD Initiated Contact with Patient 03/22/16 1220     Chief Complaint  Patient presents with  . Hypotension     (Consider location/radiation/quality/duration/timing/severity/associated sxs/prior Treatment) HPI Comments: Patient is a 70 year old male with past medical history of coronary artery disease with CABG approximately 15 years ago, type 2 diabetes, restless leg. He was admitted twice in the recent past for near-syncope. He has been having episodes where his blood pressure drops precipitously and causes him to feel faint. He has had extensive workup into this, however no cause has been found. He denies any chest pain or palpitations. He denies any fevers or chills. He does report some diarrhea over the past 2 days.  He was sent to the emergency department from his PCP office upstairs where he was being seen in follow-up of this. While there, his blood pressure was in the [id number] systolic, then dropped to the 0000000 systolic when he stood. He was sent here for further evaluation of this.  The history is provided by the patient.    Past Medical History  Diagnosis Date  . Diabetes mellitus type II, uncontrolled ([place])   . OSA (obstructive sleep apnea)   . Hyperlipidemia   . RBBB (right bundle branch block)   . Depression     Bipolar  . Edema   . Hypertension   . Falling episodes     these have occurred in the past and again recurring 2011  . Spondylosis     C5-6, C6-7 MRI 2010  . CAD (coronary artery disease)     A. CABG in 2000,status post cardiac cath in 2006, 2009 ....continued chest pain and SOB despite oral medication adjestments including Ranexa. B. Cath November 2009/ mRCA - 2.75 x 23 Abbott Xience V drug-eluting stent ...11/26/2008 to distal  RCA leading to acute marginal.  C. Cath 07/2012 for CP - stable anatomy, med rx. d. cath 2015 and 05/30/2015 stable anatomy, consider Myoview if has CP again  . Hx of CABG      2000,  / one median sternotomy suture broken her chest x-ray November, 2010, no clinical significance  . Palpitations     event recorder showed sinus rhythm  . Nephrolithiasis   . Restless leg   . Dizziness   . Shortness of breath     CPX April, 2011, mild functional limitation, no clear pulmonary or cardiac limitation, possible deconditioning and mild chronotropic incompetence( peak heart rate 130)  . Cerebral ischemia     MRI November, 2010, chronic microvascular ischemia  . Anemia     hemoglobin 7.4, iron deficiency, January, 2011, 2 unit transfusion, endoscopy normal, capsule endoscopy February, 2011 no small bowel abnormalities.   Most likely source gastric erosions, followed by GI  . H/O medication noncompliance     Due to loss of insurance  . Ejection fraction     EF 60%, echo, July 31, 2012  . Diabetes mellitus without complication ([place])   . Pain in limb 06/12/2009    Qualifier: Diagnosis of  By: [name]   . Low back pain 06/12/2009    Qualifier: Diagnosis of  By: [name]   . Thyroid disease   . Right knee pain 01/07/2015  . Advance care planning 05/01/2015  . ACP (advance care planning) 05/11/2015  . Nausea with vomiting 01/25/2016  . Headache 01/25/2016  . Diarrhea 02/15/2016  . Gastric ulcer   .  Tubulovillous adenoma of colon 2007  . Iron deficiency anemia   . Syncope 03/2016  . Chronic diastolic CHF (congestive heart failure) ([place]) 03/13/2016   Past Surgical History  Procedure Laterality Date  . Nasal septum surgery      UP3  . Coronary artery bypass graft      2000  . Left heart catheterization with coronary/graft angiogram N/A 08/01/2012    Procedure: LEFT HEART CATHETERIZATION WITH Beatrix Fetters;  Surgeon: [name], MD;  Location: [facility] CATH LAB;  Service: Cardiovascular;  Laterality: N/A;  . Left heart catheterization with coronary/graft angiogram N/A 01/03/2015    Procedure: LEFT HEART CATHETERIZATION WITH Beatrix Fetters;   Surgeon: [name], MD;  Location: [facility] CATH LAB;  Service: Cardiovascular;  Laterality: N/A;  . Percutaneous coronary stent intervention (pci-s)  10/2008    mRCA PCI  2.75 x 23 Abbott Xience V drug-eluting stent   . Cardiac catheterization N/A 05/30/2015    Procedure: Left Heart Cath and Coronary Angiography;  Surgeon: [name], MD;  Location: [place] CV LAB;  Service: Cardiovascular;  Laterality: N/A;   Family History  Problem Relation Age of Onset  . Cancer Brother     pancreatic cancer  . Diabetes Brother     4   . Stroke Brother   . Diabetes Brother   . Cancer Brother   . Diabetes Brother   . Coronary artery disease Brother   . Diabetes Brother   . Hypothyroidism Brother   . Heart attack      Nephew  . Diabetes Mother   . Heart failure Mother   . Heart failure Father   . Irregular heart beat Daughter   . Cancer Maternal Grandmother     unknown    Social History  Substance Use Topics  . Smoking status: Never Smoker   . Smokeless tobacco: Never Used  . Alcohol Use: No     Comment: stopped drinking in 1998    Review of Systems  All other systems reviewed and are negative.     Allergies  Morphine and Shellfish-derived products  Home Medications   Prior to Admission medications   Medication Sig Start Date End Date Taking? Authorizing Provider  atorvastatin (LIPITOR) 40 MG tablet Take 1 tablet (40 mg total) by mouth daily at 6 PM. Patient taking differently: Take 40 mg by mouth daily.  05/30/15   Almyra Deforest, PA  baclofen (LIORESAL) 10 MG tablet Take 0.5 tablets (5 mg total) by mouth at bedtime. 03/11/16   Bonnielee Haff, MD  Cholecalciferol (DIALYVITE VITAMIN D3 MAX) 09811 units TABS Take 1 tablet by mouth once a week. Patient taking differently: Take 1 tablet by mouth once a week. Wednesdays 01/16/16   [name], MD  clopidogrel (PLAVIX) 75 MG tablet Take 1 tablet (75 mg total) by mouth daily. 03/11/16   Bonnielee Haff, MD  clotrimazole-betamethasone  (LOTRISONE) cream APPLY TO THE AFFECTED TWICE DAILY Patient taking differently: Apply 1 application topically 2 (two) times daily as needed (dry skin).  02/09/16   [name], MD  diclofenac sodium (VOLTAREN) 1 % GEL Apply 2 g topically 3 (three) times daily as needed. For pain to affected area Patient taking differently: Apply 1 application topically 3 (three) times daily as needed (pain).  05/01/15   [name], MD  gabapentin (NEURONTIN) 300 MG capsule 2 capsules in AM, 1 capsule at noon, 2 capsules in PM daily Patient taking differently: Take 300-600 mg by mouth 3 (  three) times daily. Take 2 capsules (600 mg) every morning, 1 capsule (300 mg) at noon, and 2 capsules (600 mg) at night 02/09/16   [name], MD  glucose blood (ACCU-CHEK AVIVA) test strip Use as directed to check blood sugar 4 times daily.  DX E11.9 02/09/16   [name], MD  insulin aspart (NOVOLOG) 100 UNIT/ML injection Inject 10-17 Units into the skin 3 (three) times daily with meals. Use 10-17 unit 3 times daily before meals Patient taking differently: Inject 10-17 Units into the skin 3 (three) times daily with meals. Per sliding scale 02/09/16   [name], MD  Insulin Glargine (TOUJEO SOLOSTAR) 300 UNIT/ML SOPN Inject 42 Units into the skin at bedtime. 09/16/15   [name], MD  levothyroxine (SYNTHROID, LEVOTHROID) 75 MCG tablet Take 1 tablet (75 mcg total) by mouth daily. 02/09/16   [name], MD  metFORMIN (GLUCOPHAGE) 1000 MG tablet Take 1 tablet (1,000 mg total) by mouth 2 (two) times daily with a meal. 06/12/15   [name], MD  midodrine (PROAMATINE) 10 MG tablet Take 10 mg by mouth 3 (three) times daily. 03/17/16   Historical Provider, MD  midodrine (PROAMATINE) 5 MG tablet Take 1 tablet (5 mg total) by mouth 3 (three) times daily with meals. 03/17/16   Brunetta Jeans, PA-C  nitroGLYCERIN (NITROSTAT) 0.4 MG SL tablet Place 1 tablet (0.4 mg total) under the tongue every 5 (five) minutes as needed (up  to 3 doses). Patient taking differently: Place 0.4 mg under the tongue every 5 (five) minutes as needed for chest pain (up to 3 doses).  02/09/16   [name], MD  ondansetron (ZOFRAN-ODT) 4 MG disintegrating tablet Take 1 tablet (4 mg total) by mouth every 8 (eight) hours as needed for nausea or vomiting. 03/17/16   Brunetta Jeans, PA-C  pantoprazole (PROTONIX) 40 MG tablet Take 40 mg by mouth daily.    Historical Provider, MD  pramipexole (MIRAPEX) 1 MG tablet Take 1 mg by mouth at bedtime. 03/08/16   Historical Provider, MD  ranolazine (RANEXA) 500 MG 12 hr tablet Take 500 mg by mouth 2 (two) times daily.    Historical Provider, MD  sertraline (ZOLOFT) 100 MG tablet Take 1 tablet (100 mg total) by mouth 2 (two) times daily. 07/24/15   [name], MD  tiZANidine (ZANAFLEX) 4 MG tablet Take 1 tablet (4 mg total) by mouth every 8 (eight) hours as needed for muscle spasms. 11/19/15   Pieter Partridge, DO  topiramate (TOPAMAX) 50 MG tablet Take 1 tablet (50 mg total) by mouth at bedtime. 04/25/15   Pieter Partridge, DO   BP 103/66 mmHg  Pulse 82  Resp 18  SpO2 96% Physical Exam  Constitutional: He is oriented to person, place, and time. He appears well-developed and well-nourished. No distress.  HENT:  Head: Normocephalic and atraumatic.  Mouth/Throat: Oropharynx is clear and moist.  Eyes: EOM are normal. Pupils are equal, round, and reactive to light.  Neck: Normal range of motion. Neck supple.  Cardiovascular: Normal rate, regular rhythm and normal heart sounds.   No murmur heard. Pulmonary/Chest: Effort normal and breath sounds normal. No respiratory distress. He has no wheezes. He has no rales.  Abdominal: Soft. Bowel sounds are normal. He exhibits no distension. There is no tenderness. There is no rebound.  Musculoskeletal: Normal range of motion. He exhibits no edema.  Lymphadenopathy:    He has no cervical adenopathy.  Neurological:  He is alert and oriented to person, place, and time.   Skin: Skin is warm and dry. He is not diaphoretic.  Nursing note and vitals reviewed.   ED Course  Procedures (including critical care time) Labs Review Labs Reviewed  CBC WITH DIFFERENTIAL/PLATELET - Abnormal; Notable for the following:    RBC 4.14 (*)    HCT 36.8 (*)    All other components within normal limits  COMPREHENSIVE METABOLIC PANEL  AMMONIA  TROPONIN I    Imaging Review No results found. I have personally reviewed and evaluated these images and lab results as part of my medical decision-making.  ED ECG REPORT   Date: 03/22/2016  Rate: 65  Rhythm: normal sinus rhythm  QRS Axis: indeterminate  Intervals: normal  ST/T Wave abnormalities: normal  Conduction Disutrbances:right bundle branch block  Narrative Interpretation:   Old EKG Reviewed: none available  I have personally reviewed the EKG tracing and agree with the computerized printout as noted.   MDM   Final diagnoses:  None    Patient sent from [facility] clinic upstairs for evaluation of syncope. He has been admitted on 2 occasions with syncope/hypotension which has been unexplained. He has been normotensive throughout his emergency department course. He was hydrated and the vital signs I am obtaining are not orthostatic. He appears clinically well and laboratory studies and CT of the chest are all unremarkable. I discussed the workup and vitals signs with Einar Pheasant, the PA with Dr. Randel Pigg. We are both in agreement the patient appears appropriate for discharge. The patient does not want to be in the hospital anyway. He has an appointment with neurology tomorrow and I feel as though follow-up with them is most appropriate.    Veryl Speak, MD 03/22/16 [phone]

## 2016-03-22 NOTE — Telephone Encounter (Signed)
Please D/C metoprolol completely and OK to give VO for PT although patient is in ED now.

## 2016-03-23 ENCOUNTER — Encounter: Payer: Self-pay | Admitting: Neurology

## 2016-03-23 ENCOUNTER — Telehealth: Payer: Self-pay

## 2016-03-23 ENCOUNTER — Ambulatory Visit (INDEPENDENT_AMBULATORY_CARE_PROVIDER_SITE_OTHER): Payer: Commercial Managed Care - HMO | Admitting: Neurology

## 2016-03-23 VITALS — BP 102/58 | HR 70 | Ht 69.0 in | Wt 180.7 lb

## 2016-03-23 DIAGNOSIS — R55 Syncope and collapse: Secondary | ICD-10-CM

## 2016-03-23 NOTE — Telephone Encounter (Signed)
Could come in at 7:15 or 7:30 to see him this Friday or next

## 2016-03-23 NOTE — Telephone Encounter (Signed)
[name] [name] the daughter of [name] [name] 08/15/1946 called requesting a call back please. It is regarding his appointment last Thursday. Her call back number is [phone]. Thank you

## 2016-03-23 NOTE — Telephone Encounter (Signed)
Patient wife called because he was seen in Ed 4/17 wants to know how soon follow up with for blood pressure being low has an appointment 5/22 want to know if they can wait until then or if they should come back before [phone]. Offer patient an ed follow up appointment for this encounter patient wanted to get providers recommendation first..Marland Kitchen

## 2016-03-23 NOTE — Telephone Encounter (Signed)
I think this was meant for you

## 2016-03-23 NOTE — Progress Notes (Signed)
NEUROLOGY FOLLOW UP OFFICE NOTE  [name] HANSING RN:[id number]  HISTORY OF PRESENT ILLNESS: He has been experiencing episodes since February.  He will suddenly feel lightheaded, diaphoretic, experience palpitations and then tunnel vision.  He will then pass out.  He doesn't actually remember falling.  He is unconscious very briefly but he appears to be in a deep sleep.  There are no convulsions, bowel or bladder incontinence or tongue biting.  When he wakes up, he is confused and slurs his speech from 30 to 60 minutes.  If he is up afterwards, he is unsteady on his feet.  It happens at rest but is much worse if he stands up.  This can occur almost daily.  In addition, he appears to have memory issues.  He often misplaces items such as his wallet.  He will put down one cup in the kitchen and then grab for another cup, forgetting that he already had one.  He is not acting like himself.  He seems to be having more vivid dreams.  He will wake up from sleep, still acting out from his dream.  One night, he woke up calling for his deceased father and brother.  Another time, he woke up in the middle of the night when his wife got up to go use the restroom.  He sat up confused and asked where he was and where home is.  He has been admitted to [facility] twice in the past two weeks.  Blood pressure was noted to be as low as 72/47 in the ED.  During his first hospitalization on 03/09/16, MRI of brain was unremarkable for acute abnormality.  CTA of head and neck was unremarkable.  Echo showed EF 55-60% with grade 1 diastolic dysfunction.  Routine EEG was normal.  Telemetry revealed no arrhythmias.  He was noted to be bradycardic with HR in the 50s, so his beta blocker dose was reduced.  Diagnosis was unclear but his ASA was switched to Plavix.    He was readmitted on 03/13/16 for similar event.  He underwent long term EEG monitoring for 24 hours, which was normal but no event was captured.  Repeat routine EEG on 03/18/16  was also normal.  He presented to the ED yesterday from his PCP's office.  Blood pressure was recorded at [id number] and then systolic dropped to the 0000000 when he stood up.  He felt faint.  In the ED, his blood pressure was 103/66 with pulse of 82.  CT of chest was negative for PE.  He was hydrated and then discharged.  He is on Midodrine and using thigh-high stockings.  He has PT at home.  He continues to have cervicogenic headaches/occipital neuralgia.  He appears to have subtle left upper extremity weakness, but this was noted in prior notes from 2010.  He also sometimes notes a tremor in the left hand.  PAST MEDICAL HISTORY: Past Medical History  Diagnosis Date  . Diabetes mellitus type II, uncontrolled ([place])   . OSA (obstructive sleep apnea)   . Hyperlipidemia   . RBBB (right bundle branch block)   . Depression     Bipolar  . Edema   . Hypertension   . Falling episodes     these have occurred in the past and again recurring 2011  . Spondylosis     C5-6, C6-7 MRI 2010  . CAD (coronary artery disease)     A. CABG in 2000,status post cardiac cath in 2006, 2009 ....continued chest  pain and SOB despite oral medication adjestments including Ranexa. B. Cath November 2009/ mRCA - 2.75 x 23 Abbott Xience V drug-eluting stent ...11/26/2008 to distal  RCA leading to acute marginal.  C. Cath 07/2012 for CP - stable anatomy, med rx. d. cath 2015 and 05/30/2015 stable anatomy, consider Myoview if has CP again  . Hx of CABG     2000,  / one median sternotomy suture broken her chest x-ray November, 2010, no clinical significance  . Palpitations     event recorder showed sinus rhythm  . Nephrolithiasis   . Restless leg   . Dizziness   . Shortness of breath     CPX April, 2011, mild functional limitation, no clear pulmonary or cardiac limitation, possible deconditioning and mild chronotropic incompetence( peak heart rate 130)  . Cerebral ischemia     MRI November, 2010, chronic microvascular ischemia  .  Anemia     hemoglobin 7.4, iron deficiency, January, 2011, 2 unit transfusion, endoscopy normal, capsule endoscopy February, 2011 no small bowel abnormalities.   Most likely source gastric erosions, followed by GI  . H/O medication noncompliance     Due to loss of insurance  . Ejection fraction     EF 60%, echo, July 31, 2012  . Diabetes mellitus without complication ([place])   . Pain in limb 06/12/2009    Qualifier: Diagnosis of  By: [name]   . Low back pain 06/12/2009    Qualifier: Diagnosis of  By: [name]   . Thyroid disease   . Right knee pain 01/07/2015  . Advance care planning 05/01/2015  . ACP (advance care planning) 05/11/2015  . Nausea with vomiting 01/25/2016  . Headache 01/25/2016  . Diarrhea 02/15/2016  . Gastric ulcer   . Tubulovillous adenoma of colon 2007  . Iron deficiency anemia   . Syncope 03/2016  . Chronic diastolic CHF (congestive heart failure) ([place]) 03/13/2016    MEDICATIONS: Current Outpatient Prescriptions on File Prior to Visit  Medication Sig Dispense Refill  . atorvastatin (LIPITOR) 40 MG tablet Take 1 tablet (40 mg total) by mouth daily at 6 PM. (Patient taking differently: Take 40 mg by mouth daily. ) 30 tablet 5  . baclofen (LIORESAL) 10 MG tablet Take 0.5 tablets (5 mg total) by mouth at bedtime. 30 each 0  . Cholecalciferol (DIALYVITE VITAMIN D3 MAX) 16109 units TABS Take 1 tablet by mouth once a week. (Patient taking differently: Take 1 tablet by mouth once a week. Wednesdays) 12 tablet 1  . clopidogrel (PLAVIX) 75 MG tablet Take 1 tablet (75 mg total) by mouth daily. 30 tablet 3  . clotrimazole-betamethasone (LOTRISONE) cream APPLY TO THE AFFECTED TWICE DAILY (Patient taking differently: Apply 1 application topically 2 (two) times daily as needed (dry skin). ) 30 g 6  . diclofenac sodium (VOLTAREN) 1 % GEL Apply 2 g topically 3 (three) times daily as needed. For pain to affected area (Patient taking differently: Apply 1 application topically 3  (three) times daily as needed (pain). ) 2 Tube 3  . gabapentin (NEURONTIN) 300 MG capsule 2 capsules in AM, 1 capsule at noon, 2 capsules in PM daily (Patient taking differently: Take 300-600 mg by mouth 3 (three) times daily. Take 2 capsules (600 mg) every morning, 1 capsule (300 mg) at noon, and 2 capsules (600 mg) at night) 150 capsule 3  . glucose blood (ACCU-CHEK AVIVA) test strip Use as directed to check blood sugar 4 times daily.  DX  E11.9 200 each 6  . insulin aspart (NOVOLOG) 100 UNIT/ML injection Inject 10-17 Units into the skin 3 (three) times daily with meals. Use 10-17 unit 3 times daily before meals (Patient taking differently: Inject 10-17 Units into the skin 3 (three) times daily with meals. Per sliding scale) 15 mL 4  . Insulin Glargine (TOUJEO SOLOSTAR) 300 UNIT/ML SOPN Inject 42 Units into the skin at bedtime. 3 pen 4  . levothyroxine (SYNTHROID, LEVOTHROID) 75 MCG tablet Take 1 tablet (75 mcg total) by mouth daily. 90 tablet 2  . metFORMIN (GLUCOPHAGE) 1000 MG tablet Take 1 tablet (1,000 mg total) by mouth 2 (two) times daily with a meal. 60 tablet 5  . midodrine (PROAMATINE) 10 MG tablet Take 10 mg by mouth 3 (three) times daily.  0  . midodrine (PROAMATINE) 5 MG tablet Take 1 tablet (5 mg total) by mouth 3 (three) times daily with meals. 90 tablet 0  . nitroGLYCERIN (NITROSTAT) 0.4 MG SL tablet Place 1 tablet (0.4 mg total) under the tongue every 5 (five) minutes as needed (up to 3 doses). (Patient taking differently: Place 0.4 mg under the tongue every 5 (five) minutes as needed for chest pain (up to 3 doses). ) 25 tablet 4  . ondansetron (ZOFRAN-ODT) 4 MG disintegrating tablet Take 1 tablet (4 mg total) by mouth every 8 (eight) hours as needed for nausea or vomiting. 20 tablet 0  . pantoprazole (PROTONIX) 40 MG tablet Take 40 mg by mouth daily.    . pramipexole (MIRAPEX) 1 MG tablet Take 1 mg by mouth at bedtime.    . ranolazine (RANEXA) 500 MG 12 hr tablet Take 500 mg by mouth 2  (two) times daily.    . sertraline (ZOLOFT) 100 MG tablet Take 1 tablet (100 mg total) by mouth 2 (two) times daily. 180 tablet 1  . tiZANidine (ZANAFLEX) 4 MG tablet Take 1 tablet (4 mg total) by mouth every 8 (eight) hours as needed for muscle spasms. 90 tablet 2  . topiramate (TOPAMAX) 50 MG tablet Take 1 tablet (50 mg total) by mouth at bedtime. 30 tablet 3   No current facility-administered medications on file prior to visit.    ALLERGIES: Allergies  Allergen Reactions  . Morphine Other (See Comments)    hallucinations  . Shellfish-Derived Products Itching    FAMILY HISTORY: Family History  Problem Relation Age of Onset  . Cancer Brother     pancreatic cancer  . Diabetes Brother     4   . Stroke Brother   . Diabetes Brother   . Cancer Brother   . Diabetes Brother   . Coronary artery disease Brother   . Diabetes Brother   . Hypothyroidism Brother   . Heart attack      Nephew  . Diabetes Mother   . Heart failure Mother   . Heart failure Father   . Irregular heart beat Daughter   . Cancer Maternal Grandmother     unknown     SOCIAL HISTORY: Social History   Social History  . Marital Status: Married    Spouse Name: N/A  . Number of Children: 4  . Years of Education: 13   Occupational History  . retired    Social History Main Topics  . Smoking status: Never Smoker   . Smokeless tobacco: Never Used  . Alcohol Use: No     Comment: stopped drinking in 1998  . Drug Use: No  . Sexual Activity:    Partners:  Female   Other Topics Concern  . Not on file   Social History Narrative   Patient is right handed.   Patient does not drink caffeine.    REVIEW OF SYSTEMS: Constitutional: No fevers, chills, or sweats, no generalized fatigue, change in appetite Eyes: No visual changes, double vision, eye pain Ear, nose and throat: No hearing loss, ear pain, nasal congestion, sore throat Cardiovascular: No chest pain, palpitations Respiratory:  No shortness of  breath at rest or with exertion, wheezes GastrointestinaI: No nausea, vomiting, diarrhea, abdominal pain, fecal incontinence Genitourinary:  No dysuria, urinary retention or frequency Musculoskeletal:  No neck pain, back pain Integumentary: No rash, pruritus, skin lesions Neurological: as above Psychiatric: No depression, insomnia, anxiety Endocrine: No palpitations, fatigue, diaphoresis, mood swings, change in appetite, change in weight, increased thirst Hematologic/Lymphatic:  No anemia, purpura, petechiae. Allergic/Immunologic: no itchy/runny eyes, nasal congestion, recent allergic reactions, rashes  PHYSICAL EXAM: Filed Vitals:   03/23/16 1519  BP: 102/58  Pulse: 70   General: No acute distress.  Patient appears well-groomed.  normal body habitus. Head:  Normocephalic/atraumatic Eyes:  Fundoscopic exam unremarkable without vessel changes, exudates, hemorrhages or papilledema. Neck: supple, no paraspinal tenderness, full range of motion Heart:  Regular rate and rhythm Lungs:  Clear to auscultation bilaterally Back: No paraspinal tenderness Neurological Exam: alert and oriented to person, place, and time. Attention span and concentration intact, recent and remote memory intact, fund of knowledge intact.  Speech fluent and not dysarthric, language intact.  CN II-XII intact. Fundoscopic exam unremarkable without vessel changes, exudates, hemorrhages or papilledema.  Bulk and tone normal, muscle strength 5/5 throughout.  Sensation to light touch, temperature and vibration intact.  Deep tendon reflexes 2+ throughout, toes downgoing.  Finger to nose and heel to shin testing intact.  Gait normal, Romberg negative.  IMPRESSION: Syncope in setting of severe orthostatic hypotension.  Symptoms are classic for syncope except for the prolonged confusion afterwards.  Unusual presentation but will evaluate for seizures as well  Memory deficits.  Possibly related to recurrent cerebral hypoperfusion.  MMSE from 01/15/16 was 23.   PLAN: 1.  In trying to capture a spell, we will set up for 72 hour ambulatory EEG 2.  I would also like to set him up for neuropsychological testing 3.  He will follow up with cardiology.  I would consider sending him to an academic center with a specialist who evaluates and treats dysautonomia.  Unfortunately, we may need to look outside of [place]. 4.  In meantime, no driving or walking the dog 5.  Follow up after testing.  30 minutes spent face to face with patient, over 50% spent discussing diagnoses and plan.  Metta Clines, DO  CC:  Penni Homans, MD

## 2016-03-23 NOTE — Telephone Encounter (Signed)
Left patient a message.

## 2016-03-23 NOTE — Telephone Encounter (Signed)
[name] [name] the daughter of [name] [name] 06/26/1946 called requesting a call back please. It is regarding his appointment last Thursday. Her call back number is [phone]. Thank you   - Message received via Theadora Rama, was accidentally connected to telephone call at PCP office.

## 2016-03-23 NOTE — Patient Instructions (Signed)
The episodes seem like syncope up until after you wake up, when you are confused and slurred for 30 to 60 minutes.  1.  I would like to get a 72 hour ambulatory EEG to try and capture at least one of these events. 2.  I would also like to have you get a formal neuropsychological testing to assess memory 3.  If EEG is unremarkable, I would like to consider sending you to a specialist of the autonomic nervous system (which controls blood pressure and heart rate).  This would have to be at an academic center but I don't think any of the academic hospitals here in [place] have such specialists.  I will look into it. 4.  Continue midodrine and thigh high compression stockings.  Do not walk the dog. 5.  Follow up

## 2016-03-25 ENCOUNTER — Other Ambulatory Visit: Payer: Self-pay | Admitting: *Deleted

## 2016-03-25 NOTE — Patient Outreach (Signed)
Third attempt made to contact member to initiate transition of care assessment.  Member answers phone, verifies identity.  This care manager introduced self and purpose of call.  He states that he is "doing all right" and is waiting on a call from the physician to schedule more tests.  He can't verbalize what tests he will have, stating "I can't remember much.  One is to test my brainwaves and the other is to test my memory."  He reports that his wife has been caring for him and that she will be able to answer more questions.  However, she is sleep at this time.  He state he will have her return call once she is awake.  Will await call back.  [name] [name], BSN, [facility] [employer]  [facility] Care Manager [phone]

## 2016-03-29 ENCOUNTER — Telehealth: Payer: Self-pay | Admitting: *Deleted

## 2016-03-29 NOTE — Telephone Encounter (Signed)
Forwarded to Dr. Blyth. JG//CMA  

## 2016-03-30 ENCOUNTER — Ambulatory Visit (INDEPENDENT_AMBULATORY_CARE_PROVIDER_SITE_OTHER): Payer: Commercial Managed Care - HMO | Admitting: Physician Assistant

## 2016-03-30 ENCOUNTER — Encounter: Payer: Self-pay | Admitting: Physician Assistant

## 2016-03-30 VITALS — BP 140/60 | HR 88 | Ht 69.0 in | Wt 178.8 lb

## 2016-03-30 DIAGNOSIS — I951 Orthostatic hypotension: Secondary | ICD-10-CM

## 2016-03-30 DIAGNOSIS — I2510 Atherosclerotic heart disease of native coronary artery without angina pectoris: Secondary | ICD-10-CM | POA: Diagnosis not present

## 2016-03-30 DIAGNOSIS — G4733 Obstructive sleep apnea (adult) (pediatric): Secondary | ICD-10-CM | POA: Diagnosis not present

## 2016-03-30 DIAGNOSIS — E785 Hyperlipidemia, unspecified: Secondary | ICD-10-CM | POA: Diagnosis not present

## 2016-03-30 DIAGNOSIS — R55 Syncope and collapse: Secondary | ICD-10-CM | POA: Diagnosis not present

## 2016-03-30 DIAGNOSIS — R0602 Shortness of breath: Secondary | ICD-10-CM

## 2016-03-30 NOTE — Progress Notes (Signed)
Cardiology Office Note:    Date:  03/30/2016   ID:  [name] [name], DOB Aug 10, 1946, MRN [id number]  PCP:  Penni Homans, MD  Cardiologist:  Dr. Cleatis Polka >> Dr. Casandra Doffing   Electrophysiologist:  n/a  Referring MD: [name], MD   Chief Complaint  Patient presents with  . Follow-up    Hypotension    History of Present Illness:     [name] [name] is a 69 y.o. male with a hx of CAD s/p CABG in 2000 and prior stent to RCA in 2009, DM2, OSA, HL, HTN.  He has stable angina. Last seen by Dr. Casandra Doffing in 3/17.    Admitted 4/4-4/6 with near syncope and transient confusion. MRI was negative for stroke. EEG was normal. He was seen by neurology. There was no significant carotid artery stenosis on CTA. Echocardiogram demonstrated normal LV function with mild diastolic dysfunction. No arrhythmias noted on telemetry. TIA could not be ruled out. Aspirin was changed to Plavix. He was bradycardic and his beta blocker dose was reduced.  Admitted 4/8-4/11 with lightheadedness, confusion, slurred speech and right facial droop as well as left hand tremors for approximately 30 minutes. He has a history of multiple concussions and is followed as an outpatient by neurology. There was a reported history of hypotension at times. There was also reported history of occasional episodes of dizziness, slurred speech, confusion followed by lethargy. Patient would also fall asleep easily without memory of the event. He was followed by neurology. Prolonged EEG monitoring was done and results are pending at the time of discharge. Attending discussed his case a [name] according to the records and his metoprolol was discontinued. Records indicate the patient was noted to have orthostatic hypotension.  He was discharged with TED hose. Monitor was also arranged.  48-hour Holter monitor reviewed today. This demonstrated sinus rhythm with rare PVCs, rare PACs. No significant pauses. Minimum heart rate  55. Maximum heart rate 106.  Went back to the ED 4/17 with hypotension with SBP as low as 60.  Seen by neurology, Dr. [name] 03/23/16.  Plan is to undergo 72 hr ambulatory EEG.  He recommended considering referral to an academic center that specializes in dysautonomia.  Returns for FU.  It is not clear to me why he was only put on a 48 hr monitor.  Ambulatory EEG is pending. He tells me he started having more issues after a concussion 1 year ago. He notes a long hx of chest pain.  He feels like his chest symptoms have worsened over the last month or so.  His breathing is worse over the past year.  He has had increasing symptoms over the past 2 mos.  He sleeps on 2 pillows x 2 years.  Has had ? PND.  He has exertional chest discomfort.  Also has sharp chest pain when he feels near syncopal.  He tells me that his syncope typically occurs with standing.  He has a prodrome and feels confused when he gains consciousness.  He does have symptoms of dizziness when seated as well.  This typically occurs when he is in motion.  His wife notes several episodes while riding in a car.  He typically feels lethargic for a while after these episodes.   Past Medical History  Diagnosis Date  . Diabetes mellitus type II, uncontrolled ([place])   . OSA (obstructive sleep apnea)   . Hyperlipidemia   . RBBB (right bundle branch block)   .  Depression     Bipolar  . Edema   . Hypertension   . Falling episodes     these have occurred in the past and again recurring 2011  . Spondylosis     C5-6, C6-7 MRI 2010  . CAD (coronary artery disease)     A. CABG in 2000,status post cardiac cath in 2006, 2009 ....continued chest pain and SOB despite oral medication adjestments including Ranexa. B. Cath November 2009/ mRCA - 2.75 x 23 Abbott Xience V drug-eluting stent ...11/26/2008 to distal  RCA leading to acute marginal.  C. Cath 07/2012 for CP - stable anatomy, med rx. d. cath 2015 and 05/30/2015 stable anatomy, consider Myoview if  has CP again  . Hx of CABG     2000,  / one median sternotomy suture broken her chest x-ray November, 2010, no clinical significance  . Palpitations     event recorder showed sinus rhythm  . Nephrolithiasis   . Restless leg   . Dizziness   . Shortness of breath     CPX April, 2011, mild functional limitation, no clear pulmonary or cardiac limitation, possible deconditioning and mild chronotropic incompetence( peak heart rate 130)  . Cerebral ischemia     MRI November, 2010, chronic microvascular ischemia  . Anemia     hemoglobin 7.4, iron deficiency, January, 2011, 2 unit transfusion, endoscopy normal, capsule endoscopy February, 2011 no small bowel abnormalities.   Most likely source gastric erosions, followed by GI  . H/O medication noncompliance     Due to loss of insurance  . Ejection fraction     EF 60%, echo, July 31, 2012  . Diabetes mellitus without complication ([place])   . Pain in limb 06/12/2009    Qualifier: Diagnosis of  By: [name]   . Low back pain 06/12/2009    Qualifier: Diagnosis of  By: [name]   . Thyroid disease   . Right knee pain 01/07/2015  . Advance care planning 05/01/2015  . ACP (advance care planning) 05/11/2015  . Nausea with vomiting 01/25/2016  . Headache 01/25/2016  . Diarrhea 02/15/2016  . Gastric ulcer   . Tubulovillous adenoma of colon 2007  . Iron deficiency anemia   . Syncope 03/2016  . Chronic diastolic CHF (congestive heart failure) ([place]) 03/13/2016    Past Surgical History  Procedure Laterality Date  . Nasal septum surgery      UP3  . Coronary artery bypass graft      2000  . Left heart catheterization with coronary/graft angiogram N/A 08/01/2012    Procedure: LEFT HEART CATHETERIZATION WITH Beatrix Fetters;  Surgeon: [name], MD;  Location: [facility] CATH LAB;  Service: Cardiovascular;  Laterality: N/A;  . Left heart catheterization with coronary/graft angiogram N/A 01/03/2015    Procedure: LEFT HEART CATHETERIZATION  WITH Beatrix Fetters;  Surgeon: [name], MD;  Location[facility] CATH LAB;  Service: Cardiovascular;  Laterality: N/A;  . Percutaneous coronary stent intervention (pci-s)  10/2008    mRCA PCI  2.75 x 23 Abbott Xience V drug-eluting stent   . Cardiac catheterization N/A 05/30/2015    Procedure: Left Heart Cath and Coronary Angiography;  Surgeon: [name], MD;  Location: [facility] CV LAB;  Service: Cardiovascular;  Laterality: N/A;    Current Medications: Outpatient Prescriptions Prior to Visit  Medication Sig Dispense Refill  . atorvastatin (LIPITOR) 40 MG tablet Take 1 tablet (40 mg total) by mouth daily at 6 PM. (Patient taking differently:  Take 40 mg by mouth daily. ) 30 tablet 5  . baclofen (LIORESAL) 10 MG tablet Take 0.5 tablets (5 mg total) by mouth at bedtime. 30 each 0  . Cholecalciferol (DIALYVITE VITAMIN D3 MAX) 60454 units TABS Take 1 tablet by mouth once a week. (Patient taking differently: Take 1 tablet by mouth once a week. Wednesdays) 12 tablet 1  . clopidogrel (PLAVIX) 75 MG tablet Take 1 tablet (75 mg total) by mouth daily. 30 tablet 3  . clotrimazole-betamethasone (LOTRISONE) cream APPLY TO THE AFFECTED TWICE DAILY (Patient taking differently: Apply 1 application topically 2 (two) times daily as needed (dry skin). ) 30 g 6  . diclofenac sodium (VOLTAREN) 1 % GEL Apply 2 g topically 3 (three) times daily as needed. For pain to affected area (Patient taking differently: Apply 1 application topically 3 (three) times daily as needed (pain). ) 2 Tube 3  . glucose blood (ACCU-CHEK AVIVA) test strip Use as directed to check blood sugar 4 times daily.  DX E11.9 200 each 6  . insulin aspart (NOVOLOG) 100 UNIT/ML injection Inject 10-17 Units into the skin 3 (three) times daily with meals. Use 10-17 unit 3 times daily before meals (Patient taking differently: Inject 10-17 Units into the skin 3 (three) times daily with meals. Per sliding scale) 15 mL 4  . Insulin Glargine  (TOUJEO SOLOSTAR) 300 UNIT/ML SOPN Inject 42 Units into the skin at bedtime. 3 pen 4  . levothyroxine (SYNTHROID, LEVOTHROID) 75 MCG tablet Take 1 tablet (75 mcg total) by mouth daily. 90 tablet 2  . metFORMIN (GLUCOPHAGE) 1000 MG tablet Take 1 tablet (1,000 mg total) by mouth 2 (two) times daily with a meal. 60 tablet 5  . midodrine (PROAMATINE) 5 MG tablet Take 1 tablet (5 mg total) by mouth 3 (three) times daily with meals. 90 tablet 0  . nitroGLYCERIN (NITROSTAT) 0.4 MG SL tablet Place 1 tablet (0.4 mg total) under the tongue every 5 (five) minutes as needed (up to 3 doses). (Patient taking differently: Place 0.4 mg under the tongue every 5 (five) minutes as needed for chest pain (up to 3 doses). ) 25 tablet 4  . ondansetron (ZOFRAN-ODT) 4 MG disintegrating tablet Take 1 tablet (4 mg total) by mouth every 8 (eight) hours as needed for nausea or vomiting. 20 tablet 0  . pantoprazole (PROTONIX) 40 MG tablet Take 40 mg by mouth daily.    . pramipexole (MIRAPEX) 1 MG tablet Take 1 mg by mouth at bedtime.    . ranolazine (RANEXA) 500 MG 12 hr tablet Take 500 mg by mouth 2 (two) times daily.    . sertraline (ZOLOFT) 100 MG tablet Take 1 tablet (100 mg total) by mouth 2 (two) times daily. 180 tablet 1  . tiZANidine (ZANAFLEX) 4 MG tablet Take 1 tablet (4 mg total) by mouth every 8 (eight) hours as needed for muscle spasms. 90 tablet 2  . topiramate (TOPAMAX) 50 MG tablet Take 1 tablet (50 mg total) by mouth at bedtime. 30 tablet 3  . gabapentin (NEURONTIN) 300 MG capsule 2 capsules in AM, 1 capsule at noon, 2 capsules in PM daily (Patient taking differently: Take 300-600 mg by mouth 3 (three) times daily. Take 2 capsules (600 mg) every morning, 1 capsule (300 mg) at noon, and 2 capsules (600 mg) at night) 150 capsule 3  . midodrine (PROAMATINE) 10 MG tablet Take 10 mg by mouth 3 (three) times daily. Reported on 03/30/2016  0   No facility-administered medications prior  to visit.     Allergies:    Morphine and Shellfish-derived products   Social History   Social History  . Marital Status: Married    Spouse Name: N/A  . Number of Children: 4  . Years of Education: 13   Occupational History  . retired    Social History Main Topics  . Smoking status: Never Smoker   . Smokeless tobacco: Never Used  . Alcohol Use: No     Comment: stopped drinking in 1998  . Drug Use: No  . Sexual Activity:    Partners: Female   Other Topics Concern  . None   Social History Narrative   Patient is right handed.   Patient does not drink caffeine.     Family History:  The patient's family history includes Cancer in his brother, brother, and maternal grandmother; Coronary artery disease in his brother; Diabetes in his brother, brother, brother, brother, and mother; Heart failure in his father and mother; Hypothyroidism in his brother; Irregular heart beat in his daughter; Stroke in his brother.   ROS:   Please see the history of present illness.    Review of Systems  Constitution: Positive for decreased appetite.  HENT: Positive for headaches.   Cardiovascular: Positive for syncope.  Neurological: Positive for dizziness and loss of balance.  Psychiatric/Behavioral: Positive for depression. The patient is nervous/anxious.    All other systems reviewed and are negative.   Physical Exam:    VS:  BP 140/60 mmHg  Pulse 88  Ht 5\' 9"  (1.753 m)  Wt 178 lb 12.8 oz (81.103 kg)  BMI 26.39 kg/m2  SpO2 96%    Orthostatic VS for the past 24 hrs:  BP- Lying Pulse- Lying BP- Sitting Pulse- Sitting BP- Standing at 0 minutes Pulse- Standing at 0 minutes  03/30/16 1356 127/69 mmHg 84 118/71 mmHg 100 103/66 mmHg 102  Standing at 3 minutes: BP 109/67; HR 102  GEN: Well nourished, well developed, in no acute distress HEENT: normal Neck: no JVD, no masses Cardiac: Normal S1/S2, RRR; no murmurs, rubs, or gallops, no edema;     Respiratory:  clear to auscultation bilaterally; no wheezing, rhonchi or  rales GI: soft, nontender, nondistended MS: no deformity or atrophy Skin: warm and dry Neuro: No focal deficits  Psych: Alert and oriented x 3, normal affect  Wt Readings from Last 3 Encounters:  03/30/16 178 lb 12.8 oz (81.103 kg)  03/23/16 180 lb 11.2 oz (81.965 kg)  03/22/16 177 lb 6 oz (80.457 kg)      Studies/Labs Reviewed:     EKG:  EKG is   ordered today.  The ekg ordered today demonstrates NSR, HR 89, RBBB, no change from prior tracing  Recent Labs: 03/10/2016: Magnesium 1.8; TSH 5.287* 03/13/2016: B Natriuretic Peptide 24.8 03/22/2016: ALT 28; BUN 18; Creatinine, Ser 1.12; Hemoglobin 13.1; Platelets 208; Potassium 4.4; Sodium 134*   Recent Lipid Panel    Component Value Date/Time   CHOL 75 02/09/2016 1656   TRIG 244.0* 02/09/2016 1656   HDL 27.20* 02/09/2016 1656   CHOLHDL 3 02/09/2016 1656   VLDL 48.8* 02/09/2016 1656   LDLCALC 107* 05/01/2015 1136   LDLDIRECT 25.0 02/09/2016 1656    Additional studies/ records that were reviewed today include:   Echo 03/10/16 Mild concentric LVH, EF 55-60%, normal wall motion, grade 1 diastolic dysfunction, mild MR, mild LAE  Carotid US 6/16 Bilateral ICA 1-39%  LHC 6/16 LAD occluded, D1 ostial 95%, D2 occluded LCx proximal 20%, mid  30% RCA mid DES patent, posterior AV branch occluded LIMA-LAD patent SVG-D1 patent SVG-D2 patent SVG-posterior AV branch patent EF 55-65%  Myoview 2/06 1. Negative for ischemia or fixed perfusion defect. Sensitivity may be decreased due to stressed heart rate below target range. 2. Left ventricular ejection fraction of 53 %  ASSESSMENT:     1. Syncope, unspecified syncope type   2. Orthostatic hypotension   3. Coronary artery disease involving native coronary artery of native heart without angina pectoris   4. SOB (shortness of breath)   5. Hyperlipidemia     PLAN:     In order of problems listed above:  1. Syncope - He has a hx of syncope with a couple of different scenarios.  I  suspect that orthostatic hypotension is playing a large role.  He also has symptoms when in motion which raises the question of post concussive or neurogenic syncope.  As noted, he has a neuro workup ongoing.  I cannot explain how his chest pain relates.  Recent echo with normal LVEF which makes risk of sudden death extremely low.  In any event he should not drive for 6 mos (or until cleared by Neuro).  Holter monitor with no arrhythmia or significant bradycardia.  I discussed his case with Dr. Casandra Doffing today.  We will have him see Dr. Virl Axe to see if he has any other recommendations and if he would recommend proceeding with implantation of an ILR.    2. Orthostatic Hypotension - BP drops ~ 20 mmHg and HR increases 18 bpm from lying to standing today.   He is now on Midodrine.  Also wearing compression stockings.  May want to get an abdominal binder.  He should FU with his provider that Rx'd Baclofen, Pramipexole, Tizanidine as these drugs can cause hypotension and orthostatic hypotension.    3. CAD - s/p prior CABG and subsequent PCI to native RCA.  Last cath in 2016 with patent grafts and patent RCA stent.  He has a hx of chronic angina.  Seems to be having more chest pain recently.  Discussed with Dr. Casandra Doffing.  With recent LHC with patent grafts and patent RCA stent, no further testing is recommended.  Continue nitrates, Ranolazine.  He has been on these drugs for a long time.  They can both cause hypotension/orthostatic hypotension. May need to keep these drugs in mind as well as a potential offending agent worsening his symptoms.    4. Dyspnea - Question if related to deconditioning.  He has diastolic dysfunction on Echo.  Would try to avoid diuretics given orthostasis.  Check BNP today.    5. HL - Continue statin.    Time spent with patient today 45 minutes. This included review of records, evaluating the patient and coordinating care. Face-to-face time >50%.  Medication  Adjustments/Labs and Tests Ordered: Current medicines are reviewed at length with the patient today.  Concerns regarding medicines are outlined above.  Medication changes, Labs and Tests ordered today are outlined in the Patient Instructions noted below. Patient Instructions  Follow up with your doctors who prescribed: Baclofen, Mirapex, Zanaflex These medications can cause low blood pressure or drops in your BP when you stand. Medication Instructions:  Your physician recommends that you continue on your current medications as directed. Please refer to the Current Medication list given to you today.  Labwork: BNP Today Testing/Procedures: None ordered Follow-Up: Follow up with Dr.Varanasi as planned You have been referred to Dr.Klein (To evaluate syncope) Any  Other Special Instructions Will Be Listed Below (If Applicable). NO Driving, until further instruted If you need a refill on your cardiac medications before your next appointment, please call your pharmacy.   Signed, Richardson Dopp, PA-C  03/30/2016 4:54 PM    [facility] [place], [place],   [address] Phone: [phone]; Fax: [phone]

## 2016-03-30 NOTE — Patient Instructions (Addendum)
Follow up with your doctors who prescribed: Baclofen, Mirapex, Zanaflex These medications can cause low blood pressure or drops in your BP when you stand. Medication Instructions:  Your physician recommends that you continue on your current medications as directed. Please refer to the Current Medication list given to you today.  Labwork: BNP Today Testing/Procedures: None ordered Follow-Up: Follow up with Dr.Varanasi as planned You have been referred to Dr.Klein (To evaluate syncope) Any Other Special Instructions Will Be Listed Below (If Applicable). NO Driving, until further instruted If you need a refill on your cardiac medications before your next appointment, please call your pharmacy.

## 2016-03-31 ENCOUNTER — Telehealth: Payer: Self-pay | Admitting: *Deleted

## 2016-03-31 ENCOUNTER — Encounter: Payer: Self-pay | Admitting: *Deleted

## 2016-03-31 ENCOUNTER — Other Ambulatory Visit: Payer: Self-pay | Admitting: *Deleted

## 2016-03-31 LAB — BRAIN NATRIURETIC PEPTIDE: BRAIN NATRIURETIC PEPTIDE: 18.9 pg/mL (ref <100)

## 2016-03-31 NOTE — Telephone Encounter (Signed)
Pt has been notified of lab results by phone with verbal understanding. 

## 2016-03-31 NOTE — Patient Outreach (Signed)
Call placed to member/wife to complete transition of care assessment.  Member states that he is no longer interested in the program.  Encouraged to contact this care manager or [facility] office if needed in the future.  Will notify care management assistant and PCP of case closure.  Valente David, BSN, [place] Management  [facility] Care Manager [phone]

## 2016-03-31 NOTE — Telephone Encounter (Signed)
FOLLOW UP   Pt wife calling to return call for pt lab results

## 2016-03-31 NOTE — Telephone Encounter (Signed)
Lmptcb on both home and cell for lab results 

## 2016-04-01 NOTE — Telephone Encounter (Signed)
Signed forms faxed to [facility] successfully, sent for scanning. [id number]

## 2016-04-04 NOTE — Progress Notes (Signed)
Patient presents to clinic for second follow-up of orthostatic hypotension after starting Midodrine as directed. Patient endorses taking medication as directed. Is still wearing TED hose and has stopped BP medications. Has not had FU with Cardiology yet but has had Holter Monitor placed. Endorses continued lightheadedness with worsening fatigue and weakness. Denies chest pain but notes SOB. Denies palpitations. Wife notes he has not been eating well. Sugars are stable per wife. Denies recurrent episode of syncope but wife endorses patient has had to use walker to keep steady balance.   Past Medical History  Diagnosis Date  . Diabetes mellitus type II, uncontrolled ([place])   . OSA (obstructive sleep apnea)   . Hyperlipidemia   . RBBB (right bundle branch block)   . Depression     Bipolar  . Edema   . Hypertension   . Falling episodes     these have occurred in the past and again recurring 2011  . Spondylosis     C5-6, C6-7 MRI 2010  . CAD (coronary artery disease)     A. CABG in 2000,status post cardiac cath in 2006, 2009 ....continued chest pain and SOB despite oral medication adjestments including Ranexa. B. Cath November 2009/ mRCA - 2.75 x 23 Abbott Xience V drug-eluting stent ...11/26/2008 to distal  RCA leading to acute marginal.  C. Cath 07/2012 for CP - stable anatomy, med rx. d. cath 2015 and 05/30/2015 stable anatomy, consider Myoview if has CP again  . Hx of CABG     2000,  / one median sternotomy suture broken her chest x-ray November, 2010, no clinical significance  . Palpitations     event recorder showed sinus rhythm  . Nephrolithiasis   . Restless leg   . Dizziness   . Shortness of breath     CPX April, 2011, mild functional limitation, no clear pulmonary or cardiac limitation, possible deconditioning and mild chronotropic incompetence( peak heart rate 130)  . Cerebral ischemia     MRI November, 2010, chronic microvascular ischemia  . Anemia     hemoglobin 7.4, iron  deficiency, January, 2011, 2 unit transfusion, endoscopy normal, capsule endoscopy February, 2011 no small bowel abnormalities.   Most likely source gastric erosions, followed by GI  . H/O medication noncompliance     Due to loss of insurance  . Ejection fraction     EF 60%, echo, July 31, 2012  . Diabetes mellitus without complication ([place])   . Pain in limb 06/12/2009    Qualifier: Diagnosis of  By: [name]   . Low back pain 06/12/2009    Qualifier: Diagnosis of  By: [name]   . Thyroid disease   . Right knee pain 01/07/2015  . Advance care planning 05/01/2015  . ACP (advance care planning) 05/11/2015  . Nausea with vomiting 01/25/2016  . Headache 01/25/2016  . Diarrhea 02/15/2016  . Gastric ulcer   . Tubulovillous adenoma of colon 2007  . Iron deficiency anemia   . Syncope 03/2016  . Chronic diastolic CHF (congestive heart failure) ([place]) 03/13/2016    Current Outpatient Prescriptions on File Prior to Visit  Medication Sig Dispense Refill  . atorvastatin (LIPITOR) 40 MG tablet Take 1 tablet (40 mg total) by mouth daily at 6 PM. (Patient taking differently: Take 40 mg by mouth daily. ) 30 tablet 5  . baclofen (LIORESAL) 10 MG tablet Take 0.5 tablets (5 mg total) by mouth at bedtime. 30 each 0  . Cholecalciferol (DIALYVITE VITAMIN  D3 MAX) 62836 units TABS Take 1 tablet by mouth once a week. (Patient taking differently: Take 1 tablet by mouth once a week. Wednesdays) 12 tablet 1  . clopidogrel (PLAVIX) 75 MG tablet Take 1 tablet (75 mg total) by mouth daily. 30 tablet 3  . clotrimazole-betamethasone (LOTRISONE) cream APPLY TO THE AFFECTED TWICE DAILY (Patient taking differently: Apply 1 application topically 2 (two) times daily as needed (dry skin). ) 30 g 6  . diclofenac sodium (VOLTAREN) 1 % GEL Apply 2 g topically 3 (three) times daily as needed. For pain to affected area (Patient taking differently: Apply 1 application topically 3 (three) times daily as needed (pain). ) 2 Tube  3  . glucose blood (ACCU-CHEK AVIVA) test strip Use as directed to check blood sugar 4 times daily.  DX E11.9 200 each 6  . insulin aspart (NOVOLOG) 100 UNIT/ML injection Inject 10-17 Units into the skin 3 (three) times daily with meals. Use 10-17 unit 3 times daily before meals (Patient taking differently: Inject 10-17 Units into the skin 3 (three) times daily with meals. Per sliding scale) 15 mL 4  . Insulin Glargine (TOUJEO SOLOSTAR) 300 UNIT/ML SOPN Inject 42 Units into the skin at bedtime. 3 pen 4  . levothyroxine (SYNTHROID, LEVOTHROID) 75 MCG tablet Take 1 tablet (75 mcg total) by mouth daily. 90 tablet 2  . metFORMIN (GLUCOPHAGE) 1000 MG tablet Take 1 tablet (1,000 mg total) by mouth 2 (two) times daily with a meal. 60 tablet 5  . midodrine (PROAMATINE) 5 MG tablet Take 1 tablet (5 mg total) by mouth 3 (three) times daily with meals. 90 tablet 0  . nitroGLYCERIN (NITROSTAT) 0.4 MG SL tablet Place 1 tablet (0.4 mg total) under the tongue every 5 (five) minutes as needed (up to 3 doses). (Patient taking differently: Place 0.4 mg under the tongue every 5 (five) minutes as needed for chest pain (up to 3 doses). ) 25 tablet 4  . ondansetron (ZOFRAN-ODT) 4 MG disintegrating tablet Take 1 tablet (4 mg total) by mouth every 8 (eight) hours as needed for nausea or vomiting. 20 tablet 0  . pantoprazole (PROTONIX) 40 MG tablet Take 40 mg by mouth daily.    . pramipexole (MIRAPEX) 1 MG tablet Take 1 mg by mouth at bedtime.    . ranolazine (RANEXA) 500 MG 12 hr tablet Take 500 mg by mouth 2 (two) times daily.    . sertraline (ZOLOFT) 100 MG tablet Take 1 tablet (100 mg total) by mouth 2 (two) times daily. 180 tablet 1  . tiZANidine (ZANAFLEX) 4 MG tablet Take 1 tablet (4 mg total) by mouth every 8 (eight) hours as needed for muscle spasms. 90 tablet 2  . topiramate (TOPAMAX) 50 MG tablet Take 1 tablet (50 mg total) by mouth at bedtime. 30 tablet 3   No current facility-administered medications on file  prior to visit.    Allergies  Allergen Reactions  . Morphine Other (See Comments)    hallucinations  . Shellfish-Derived Products Itching    Family History  Problem Relation Age of Onset  . Cancer Brother     pancreatic cancer  . Diabetes Brother     4   . Stroke Brother   . Diabetes Brother   . Cancer Brother   . Diabetes Brother   . Coronary artery disease Brother   . Diabetes Brother   . Hypothyroidism Brother   . Heart attack      Nephew  . Diabetes Mother   .  Heart failure Mother   . Heart failure Father   . Irregular heart beat Daughter   . Cancer Maternal Grandmother     unknown     Social History   Social History  . Marital Status: Married    Spouse Name: N/A  . Number of Children: 4  . Years of Education: 13   Occupational History  . retired    Social History Main Topics  . Smoking status: Never Smoker   . Smokeless tobacco: Never Used  . Alcohol Use: No     Comment: stopped drinking in 1998  . Drug Use: No  . Sexual Activity:    Partners: Female   Other Topics Concern  . None   Social History Narrative   Patient is right handed.   Patient does not drink caffeine.   Review of Systems - See HPI.  All other ROS are negative.  BP 78/58 mmHg  Pulse 87  Temp(Src) 98.2 F (36.8 C) (Oral)  Ht 5' 9"  (1.753 m)  Wt 177 lb 6 oz (80.457 kg)  BMI 26.18 kg/m2  SpO2 95%  Physical Exam  Constitutional: He is oriented to person, place, and time. He appears distressed.  HENT:  Head: Normocephalic and atraumatic.  Eyes: Conjunctivae are normal.  Neck: Neck supple.  Cardiovascular: Normal rate, regular rhythm, normal heart sounds and intact distal pulses.   Pulmonary/Chest: Effort normal and breath sounds normal. No respiratory distress. He has no wheezes. He has no rales. He exhibits no tenderness.  Neurological: He is alert and oriented to person, place, and time. He has normal reflexes.  Skin: Skin is warm and dry.  Sallow appearance of skin    Vitals reviewed.   Recent Results (from the past 2160 hour(s))  Urine culture     Status: None   Collection Time: 01/15/16  3:10 PM  Result Value Ref Range   Colony Count 3,000 COLONIES/ML    Organism ID, Bacteria Insignificant Growth   CBC w/Diff/Platelet     Status: None   Collection Time: 01/15/16  3:10 PM  Result Value Ref Range   WBC 8.5 4.0 - 10.5 K/uL   RBC 4.61 4.22 - 5.81 Mil/uL   Hemoglobin 14.2 13.0 - 17.0 g/dL   HCT 41.7 39.0 - 52.0 %   MCV 90.5 78.0 - 100.0 fl   MCHC 34.1 30.0 - 36.0 g/dL   RDW 13.6 11.5 - 15.5 %   Platelets 218.0 150.0 - 400.0 K/uL   Neutrophils Relative % 58.8 43.0 - 77.0 %   Lymphocytes Relative 30.6 12.0 - 46.0 %   Monocytes Relative 7.1 3.0 - 12.0 %   Eosinophils Relative 3.3 0.0 - 5.0 %   Basophils Relative 0.2 0.0 - 3.0 %   Neutro Abs 5.0 1.4 - 7.7 K/uL   Lymphs Abs 2.6 0.7 - 4.0 K/uL   Monocytes Absolute 0.6 0.1 - 1.0 K/uL   Eosinophils Absolute 0.3 0.0 - 0.7 K/uL   Basophils Absolute 0.0 0.0 - 0.1 K/uL  B12     Status: None   Collection Time: 01/15/16  3:10 PM  Result Value Ref Range   Vitamin B-12 340 200 - 1100 pg/mL  TSH     Status: None   Collection Time: 01/15/16  3:10 PM  Result Value Ref Range   TSH 4.44 0.35 - 4.50 uIU/mL  Folate     Status: None   Collection Time: 01/15/16  3:10 PM  Result Value Ref Range   Folate >24.0 >5.4  ng/mL    Comment: Reference Range >17 years:   Low: <3.4 ng/mL              Borderline: 3.4-5.4 ng/mL              Normal: >5.4 ng/mL     Heavy Metals Panel, Blood     Status: None   Collection Time: 01/15/16  3:10 PM  Result Value Ref Range   Arsenic <3 <23 mcg/L    Comment: Whole Blood Arsenic level >100 mcg/L is indicative of acute/chronic exposure. Urine is usually the best specimen for the analysis of arsenic in body fluids. Blood levels tend to be low even when urine concentrations are high    Mercury, B <4 <=10 mcg/L   Lead <1 <5 mcg/dL    Comment: This test was developed and its  analytical performance characteristics have been determined by [place], [place]. It has not been cleared or approved by the U.S. Food and Drug Administration. This assay has been validated pursuant to the CLIA regulations and is used for clinical purposes.    Collection site Venous   Vitamin A     Status: None   Collection Time: 01/15/16  3:10 PM  Result Value Ref Range   Vitamin A (Retinoic Acid) 70 38 - 98 mcg/dL    Comment: Vitamin supplementation within 24 hours prior to blood draw may affect the accuracy of the results. This test was developed and its analytical performance characteristics have been determined by [place], [place]. It has not been cleared or approved by the U.S. Food and Drug Administration. This assay has been validated pursuant to the CLIA regulations and is used for clinical purposes.   Footnotes:  (1) ** Please note change in reference range(s). **     VITAMIN D 25 Hydroxy (Vit-D Deficiency, Fractures)     Status: Abnormal   Collection Time: 01/15/16  3:10 PM  Result Value Ref Range   Vit D, 25-Hydroxy 10 (L) 30 - 100 ng/mL    Comment: Vitamin D Status           25-OH Vitamin D        Deficiency                <20 ng/mL        Insufficiency         20 - 29 ng/mL        Optimal             > or = 30 ng/mL   For 25-OH Vitamin D testing on patients on D2-supplementation and patients for whom quantitation of D2 and D3 fractions is required, the QuestAssureD 25-OH VIT D, (D2,D3), LC/MS/MS is recommended: order code [phone] (patients > 2 yrs).   Ethanol     Status: None   Collection Time: 01/15/16  3:10 PM  Result Value Ref Range   Alcohol, Ethyl (B) <10 0 - 10 mg/dL    Comment: For medical purposes only.     Urinalysis     Status: Abnormal   Collection Time: 01/15/16  3:10 PM  Result Value Ref Range   Color, Urine YELLOW Yellow;Lt. Yellow   APPearance CLEAR Clear   Specific  Gravity, Urine 1.020 1.000-1.030   pH 5.5 5.0 - 8.0   Total Protein, Urine NEGATIVE Negative   Urine Glucose 250 (A) Negative   Ketones, ur TRACE (A) Negative   Bilirubin Urine NEGATIVE Negative  Hgb urine dipstick NEGATIVE Negative   Urobilinogen, UA 0.2 0.0 - 1.0   Leukocytes, UA NEGATIVE Negative   Nitrite NEGATIVE Negative  RPR     Status: None   Collection Time: 01/16/16  3:47 PM  Result Value Ref Range   RPR Ser Ql NON REAC NON REAC  Comprehensive metabolic panel     Status: Abnormal   Collection Time: 01/16/16  4:25 PM  Result Value Ref Range   Sodium 137 135 - 146 mmol/L   Potassium 4.1 3.5 - 5.3 mmol/L   Chloride 102 98 - 110 mmol/L   CO2 28 20 - 31 mmol/L   Glucose, Bld 107 (H) 65 - 99 mg/dL   BUN 12 7 - 25 mg/dL   Creat 1.02 0.70 - 1.25 mg/dL   Total Bilirubin 0.7 0.2 - 1.2 mg/dL   Alkaline Phosphatase 68 40 - 115 U/L   AST 16 10 - 35 U/L   ALT 13 9 - 46 U/L   Total Protein 6.9 6.1 - 8.1 g/dL   Albumin 4.6 3.6 - 5.1 g/dL   Calcium 9.4 8.6 - 10.3 mg/dL  Sed Rate (ESR)     Status: None   Collection Time: 01/16/16  4:25 PM  Result Value Ref Range   Sed Rate 4 0 - 20 mm/hr  Amylase     Status: Abnormal   Collection Time: 02/09/16  4:56 PM  Result Value Ref Range   Amylase 22 (L) 27 - 131 U/L  Lipid panel     Status: Abnormal   Collection Time: 02/09/16  4:56 PM  Result Value Ref Range   Cholesterol 75 0 - 200 mg/dL    Comment: ATP III Classification       Desirable:  < 200 mg/dL               Borderline High:  200 - 239 mg/dL          High:  > = 240 mg/dL   Triglycerides 244.0 (H) 0.0 - 149.0 mg/dL    Comment: Normal:  <150 mg/dLBorderline High:  150 - 199 mg/dL   HDL 27.20 (L) >39.00 mg/dL   VLDL 48.8 (H) 0.0 - 40.0 mg/dL   Total CHOL/HDL Ratio 3     Comment:                Men          Women1/2 Average Risk     3.4          3.3Average Risk          5.0          4.42X Average Risk          9.6          7.13X Average Risk          15.0          11.0                        NonHDL 48.13     Comment: NOTE:  Non-HDL goal should be 30 mg/dL higher than patient's LDL goal (i.e. LDL goal of < 70 mg/dL, would have non-HDL goal of < 100 mg/dL)  TSH     Status: None   Collection Time: 02/09/16  4:56 PM  Result Value Ref Range   TSH 4.43 0.35 - 4.50 uIU/mL  Hemoglobin A1c     Status: Abnormal   Collection Time: 02/09/16  4:56 PM  Result Value Ref Range   Hgb A1c MFr Bld 9.3 (H) 4.6 - 6.5 %    Comment: Glycemic Control Guidelines for People with Diabetes:Non Diabetic:  <6%Goal of Therapy: <7%Additional Action Suggested:  >8%   CBC     Status: None   Collection Time: 02/09/16  4:56 PM  Result Value Ref Range   WBC 9.0 4.0 - 10.5 K/uL   RBC 4.65 4.22 - 5.81 Mil/uL   Platelets 198.0 150.0 - 400.0 K/uL   Hemoglobin 14.5 13.0 - 17.0 g/dL   HCT 41.5 39.0 - 52.0 %   MCV 89.3 78.0 - 100.0 fl   MCHC 35.0 30.0 - 36.0 g/dL   RDW 13.3 11.5 - 15.5 %  Comprehensive metabolic panel     Status: Abnormal   Collection Time: 02/09/16  4:56 PM  Result Value Ref Range   Sodium 137 135 - 145 mEq/L   Potassium 4.4 3.5 - 5.1 mEq/L   Chloride 99 96 - 112 mEq/L   CO2 26 19 - 32 mEq/L   Glucose, Bld 284 (H) 70 - 99 mg/dL   BUN 13 6 - 23 mg/dL   Creatinine, Ser 1.06 0.40 - 1.50 mg/dL   Total Bilirubin 0.6 0.2 - 1.2 mg/dL   Alkaline Phosphatase 73 39 - 117 U/L   AST 17 0 - 37 U/L   ALT 26 0 - 53 U/L   Total Protein 6.9 6.0 - 8.3 g/dL   Albumin 4.7 3.5 - 5.2 g/dL   Calcium 9.9 8.4 - 10.5 mg/dL   GFR 73.97 >60.00 mL/min  Lipase     Status: None   Collection Time: 02/09/16  4:56 PM  Result Value Ref Range   Lipase 14.0 11.0 - 59.0 U/L  LDL cholesterol, direct     Status: None   Collection Time: 02/09/16  4:56 PM  Result Value Ref Range   Direct LDL 25.0 mg/dL    Comment: Optimal:  <100 mg/dLNear or Above Optimal:  100-129 mg/dLBorderline High:  130-159 mg/dLHigh:  160-189 mg/dLVery High:  >190 mg/dL  CBG monitoring, ED     Status: Abnormal   Collection Time:  03/08/16  3:55 PM  Result Value Ref Range   Glucose-Capillary 521 (H) 65 - 99 mg/dL  Urinalysis, Routine w reflex microscopic     Status: Abnormal   Collection Time: 03/08/16  5:05 PM  Result Value Ref Range   Color, Urine YELLOW YELLOW   APPearance CLEAR CLEAR   Specific Gravity, Urine 1.018 1.005 - 1.030   pH 6.0 5.0 - 8.0   Glucose, UA >1000 (A) NEGATIVE mg/dL   Hgb urine dipstick NEGATIVE NEGATIVE   Bilirubin Urine NEGATIVE NEGATIVE   Ketones, ur NEGATIVE NEGATIVE mg/dL   Protein, ur NEGATIVE NEGATIVE mg/dL   Nitrite NEGATIVE NEGATIVE   Leukocytes, UA NEGATIVE NEGATIVE  Urine microscopic-add on     Status: Abnormal   Collection Time: 03/08/16  5:05 PM  Result Value Ref Range   Squamous Epithelial / LPF 0-5 (A) NONE SEEN   WBC, UA 0-5 0 - 5 WBC/hpf   RBC / HPF 0-5 0 - 5 RBC/hpf   Bacteria, UA NONE SEEN NONE SEEN   Urine-Other MUCOUS PRESENT   Comprehensive metabolic panel     Status: Abnormal   Collection Time: 03/08/16  5:28 PM  Result Value Ref Range   Sodium 134 (L) 135 - 145 mmol/L   Potassium 4.7 3.5 - 5.1 mmol/L   Chloride 100 (L) 101 -  111 mmol/L   CO2 26 22 - 32 mmol/L   Glucose, Bld 450 (H) 65 - 99 mg/dL   BUN 15 6 - 20 mg/dL   Creatinine, Ser 1.18 0.61 - 1.24 mg/dL   Calcium 9.1 8.9 - 10.3 mg/dL   Total Protein 7.2 6.5 - 8.1 g/dL   Albumin 4.3 3.5 - 5.0 g/dL   AST 26 15 - 41 U/L   ALT 34 17 - 63 U/L   Alkaline Phosphatase 82 38 - 126 U/L   Total Bilirubin 0.7 0.3 - 1.2 mg/dL   GFR calc non Af Amer >60 >60 mL/min   GFR calc Af Amer >60 >60 mL/min    Comment: (NOTE) The eGFR has been calculated using the CKD EPI equation. This calculation has not been validated in all clinical situations. eGFR's persistently <60 mL/min signify possible Chronic Kidney Disease.    Anion gap 8 5 - 15  Troponin I     Status: None   Collection Time: 03/08/16  5:28 PM  Result Value Ref Range   Troponin I <0.03 <0.031 ng/mL    Comment:        NO INDICATION OF MYOCARDIAL  INJURY.   CBC with Differential     Status: Abnormal   Collection Time: 03/08/16  5:28 PM  Result Value Ref Range   WBC 6.6 4.0 - 10.5 K/uL   RBC 4.28 4.22 - 5.81 MIL/uL   Hemoglobin 13.5 13.0 - 17.0 g/dL   HCT 38.3 (L) 39.0 - 52.0 %   MCV 89.5 78.0 - 100.0 fL   MCH 31.5 26.0 - 34.0 pg   MCHC 35.2 30.0 - 36.0 g/dL   RDW 13.2 11.5 - 15.5 %   Platelets 148 (L) 150 - 400 K/uL   Neutrophils Relative % 62 %   Neutro Abs 4.0 1.7 - 7.7 K/uL   Lymphocytes Relative 26 %   Lymphs Abs 1.7 0.7 - 4.0 K/uL   Monocytes Relative 9 %   Monocytes Absolute 0.6 0.1 - 1.0 K/uL   Eosinophils Relative 3 %   Eosinophils Absolute 0.2 0.0 - 0.7 K/uL   Basophils Relative 0 %   Basophils Absolute 0.0 0.0 - 0.1 K/uL  Sedimentation rate     Status: None   Collection Time: 03/08/16  5:28 PM  Result Value Ref Range   Sed Rate 3 0 - 16 mm/hr  CBG monitoring, ED     Status: Abnormal   Collection Time: 03/09/16  8:00 PM  Result Value Ref Range   Glucose-Capillary 422 (H) 65 - 99 mg/dL   Comment 1 Notify RN    Comment 2 Document in Chart   Comprehensive metabolic panel     Status: Abnormal   Collection Time: 03/09/16  8:36 PM  Result Value Ref Range   Sodium 134 (L) 135 - 145 mmol/L   Potassium 4.4 3.5 - 5.1 mmol/L   Chloride 105 101 - 111 mmol/L   CO2 17 (L) 22 - 32 mmol/L   Glucose, Bld 445 (H) 65 - 99 mg/dL   BUN 15 6 - 20 mg/dL   Creatinine, Ser 1.25 (H) 0.61 - 1.24 mg/dL   Calcium 8.6 (L) 8.9 - 10.3 mg/dL   Total Protein 5.7 (L) 6.5 - 8.1 g/dL   Albumin 3.5 3.5 - 5.0 g/dL   AST 23 15 - 41 U/L   ALT 29 17 - 63 U/L   Alkaline Phosphatase 89 38 - 126 U/L   Total Bilirubin 0.8 0.3 -  1.2 mg/dL   GFR calc non Af Amer 58 (L) >60 mL/min   GFR calc Af Amer >60 >60 mL/min    Comment: (NOTE) The eGFR has been calculated using the CKD EPI equation. This calculation has not been validated in all clinical situations. eGFR's persistently <60 mL/min signify possible Chronic Kidney Disease.    Anion gap  12 5 - 15  Troponin I     Status: None   Collection Time: 03/09/16  8:36 PM  Result Value Ref Range   Troponin I <0.03 <0.031 ng/mL    Comment:        NO INDICATION OF MYOCARDIAL INJURY.   CBC with Differential     Status: Abnormal   Collection Time: 03/09/16  8:36 PM  Result Value Ref Range   WBC 8.0 4.0 - 10.5 K/uL   RBC 3.85 (L) 4.22 - 5.81 MIL/uL   Hemoglobin 12.2 (L) 13.0 - 17.0 g/dL   HCT 34.0 (L) 39.0 - 52.0 %   MCV 88.3 78.0 - 100.0 fL   MCH 31.7 26.0 - 34.0 pg   MCHC 35.9 30.0 - 36.0 g/dL   RDW 13.2 11.5 - 15.5 %   Platelets 144 (L) 150 - 400 K/uL   Neutrophils Relative % 65 %   Neutro Abs 5.2 1.7 - 7.7 K/uL   Lymphocytes Relative 25 %   Lymphs Abs 2.0 0.7 - 4.0 K/uL   Monocytes Relative 8 %   Monocytes Absolute 0.6 0.1 - 1.0 K/uL   Eosinophils Relative 2 %   Eosinophils Absolute 0.2 0.0 - 0.7 K/uL   Basophils Relative 0 %   Basophils Absolute 0.0 0.0 - 0.1 K/uL  CBG monitoring, ED     Status: None   Collection Time: 03/10/16  4:05 AM  Result Value Ref Range   Glucose-Capillary 94 65 - 99 mg/dL   Comment 1 Notify RN    Comment 2 Document in Chart   CBC WITH DIFFERENTIAL     Status: Abnormal   Collection Time: 03/10/16  5:50 AM  Result Value Ref Range   WBC 6.1 4.0 - 10.5 K/uL   RBC 3.92 (L) 4.22 - 5.81 MIL/uL   Hemoglobin 11.8 (L) 13.0 - 17.0 g/dL   HCT 34.3 (L) 39.0 - 52.0 %   MCV 87.5 78.0 - 100.0 fL   MCH 30.1 26.0 - 34.0 pg   MCHC 34.4 30.0 - 36.0 g/dL   RDW 13.2 11.5 - 15.5 %   Platelets 138 (L) 150 - 400 K/uL   Neutrophils Relative % 56 %   Neutro Abs 3.4 1.7 - 7.7 K/uL   Lymphocytes Relative 33 %   Lymphs Abs 2.0 0.7 - 4.0 K/uL   Monocytes Relative 8 %   Monocytes Absolute 0.5 0.1 - 1.0 K/uL   Eosinophils Relative 3 %   Eosinophils Absolute 0.2 0.0 - 0.7 K/uL   Basophils Relative 0 %   Basophils Absolute 0.0 0.0 - 0.1 K/uL  TSH     Status: Abnormal   Collection Time: 03/10/16  5:50 AM  Result Value Ref Range   TSH 5.287 (H) 0.350 - 4.500  uIU/mL  Hemoglobin A1c     Status: Abnormal   Collection Time: 03/10/16  5:50 AM  Result Value Ref Range   Hgb A1c MFr Bld 10.0 (H) 4.8 - 5.6 %    Comment: (NOTE)         Pre-diabetes: 5.7 - 6.4         Diabetes: >6.4  Glycemic control for adults with diabetes: <7.0    Mean Plasma Glucose 240 mg/dL    Comment: (NOTE) Performed At: [facility] [place], [place] [id number] Lindon Romp MD [id number]   Comprehensive metabolic panel     Status: Abnormal   Collection Time: 03/10/16  5:50 AM  Result Value Ref Range   Sodium 142 135 - 145 mmol/L    Comment: DELTA CHECK NOTED   Potassium 3.4 (L) 3.5 - 5.1 mmol/L    Comment: DELTA CHECK NOTED   Chloride 108 101 - 111 mmol/L   CO2 24 22 - 32 mmol/L   Glucose, Bld 86 65 - 99 mg/dL   BUN 12 6 - 20 mg/dL   Creatinine, Ser 1.05 0.61 - 1.24 mg/dL   Calcium 8.7 (L) 8.9 - 10.3 mg/dL   Total Protein 5.7 (L) 6.5 - 8.1 g/dL   Albumin 3.4 (L) 3.5 - 5.0 g/dL   AST 20 15 - 41 U/L   ALT 26 17 - 63 U/L   Alkaline Phosphatase 61 38 - 126 U/L   Total Bilirubin 0.7 0.3 - 1.2 mg/dL   GFR calc non Af Amer >60 >60 mL/min   GFR calc Af Amer >60 >60 mL/min    Comment: (NOTE) The eGFR has been calculated using the CKD EPI equation. This calculation has not been validated in all clinical situations. eGFR's persistently <60 mL/min signify possible Chronic Kidney Disease.    Anion gap 10 5 - 15  Magnesium     Status: None   Collection Time: 03/10/16  5:50 AM  Result Value Ref Range   Magnesium 1.8 1.7 - 2.4 mg/dL  Phosphorus     Status: None   Collection Time: 03/10/16  5:50 AM  Result Value Ref Range   Phosphorus 4.0 2.5 - 4.6 mg/dL  Glucose, capillary     Status: Abnormal   Collection Time: 03/10/16 11:50 AM  Result Value Ref Range   Glucose-Capillary 273 (H) 65 - 99 mg/dL   Comment 1 Notify RN    Comment 2 Document in Chart   Echocardiogram     Status: None   Collection Time: 03/10/16  1:53 PM  Result  Value Ref Range   Weight 2846.4 oz   Height 69 in   BP 150/72 mmHg  Glucose, capillary     Status: Abnormal   Collection Time: 03/10/16  4:58 PM  Result Value Ref Range   Glucose-Capillary 143 (H) 65 - 99 mg/dL   Comment 1 Notify RN    Comment 2 Document in Chart   Glucose, capillary     Status: Abnormal   Collection Time: 03/10/16  9:07 PM  Result Value Ref Range   Glucose-Capillary 266 (H) 65 - 99 mg/dL  CBC     Status: Abnormal   Collection Time: 03/11/16  3:44 AM  Result Value Ref Range   WBC 6.6 4.0 - 10.5 K/uL   RBC 4.15 (L) 4.22 - 5.81 MIL/uL   Hemoglobin 12.6 (L) 13.0 - 17.0 g/dL   HCT 36.5 (L) 39.0 - 52.0 %   MCV 88.0 78.0 - 100.0 fL   MCH 30.4 26.0 - 34.0 pg   MCHC 34.5 30.0 - 36.0 g/dL   RDW 13.2 11.5 - 15.5 %   Platelets 153 150 - 400 K/uL  Basic metabolic panel     Status: Abnormal   Collection Time: 03/11/16  3:44 AM  Result Value Ref Range   Sodium 140 135 - 145  mmol/L   Potassium 3.9 3.5 - 5.1 mmol/L   Chloride 107 101 - 111 mmol/L   CO2 22 22 - 32 mmol/L   Glucose, Bld 209 (H) 65 - 99 mg/dL   BUN 10 6 - 20 mg/dL   Creatinine, Ser 0.99 0.61 - 1.24 mg/dL   Calcium 9.3 8.9 - 10.3 mg/dL   GFR calc non Af Amer >60 >60 mL/min   GFR calc Af Amer >60 >60 mL/min    Comment: (NOTE) The eGFR has been calculated using the CKD EPI equation. This calculation has not been validated in all clinical situations. eGFR's persistently <60 mL/min signify possible Chronic Kidney Disease.    Anion gap 11 5 - 15  Glucose, capillary     Status: Abnormal   Collection Time: 03/11/16  7:42 AM  Result Value Ref Range   Glucose-Capillary 219 (H) 65 - 99 mg/dL  CBG monitoring, ED     Status: None   Collection Time: 03/13/16 11:55 AM  Result Value Ref Range   Glucose-Capillary 84 65 - 99 mg/dL  Comprehensive metabolic panel     Status: Abnormal   Collection Time: 03/13/16 11:55 AM  Result Value Ref Range   Sodium 136 135 - 145 mmol/L   Potassium 3.9 3.5 - 5.1 mmol/L    Chloride 108 101 - 111 mmol/L   CO2 21 (L) 22 - 32 mmol/L   Glucose, Bld 97 65 - 99 mg/dL   BUN 17 6 - 20 mg/dL   Creatinine, Ser 1.56 (H) 0.61 - 1.24 mg/dL   Calcium 9.4 8.9 - 10.3 mg/dL   Total Protein 7.0 6.5 - 8.1 g/dL   Albumin 4.3 3.5 - 5.0 g/dL   AST 27 15 - 41 U/L   ALT 28 17 - 63 U/L   Alkaline Phosphatase 65 38 - 126 U/L   Total Bilirubin 0.9 0.3 - 1.2 mg/dL   GFR calc non Af Amer 44 (L) >60 mL/min   GFR calc Af Amer 51 (L) >60 mL/min    Comment: (NOTE) The eGFR has been calculated using the CKD EPI equation. This calculation has not been validated in all clinical situations. eGFR's persistently <60 mL/min signify possible Chronic Kidney Disease.    Anion gap 7 5 - 15  CBC with Differential     Status: Abnormal   Collection Time: 03/13/16 11:55 AM  Result Value Ref Range   WBC 11.4 (H) 4.0 - 10.5 K/uL   RBC 4.27 4.22 - 5.81 MIL/uL   Hemoglobin 13.4 13.0 - 17.0 g/dL   HCT 37.6 (L) 39.0 - 52.0 %   MCV 88.1 78.0 - 100.0 fL   MCH 31.4 26.0 - 34.0 pg   MCHC 35.6 30.0 - 36.0 g/dL   RDW 13.2 11.5 - 15.5 %   Platelets 252 150 - 400 K/uL   Neutrophils Relative % 60 %   Neutro Abs 6.7 1.7 - 7.7 K/uL   Lymphocytes Relative 30 %   Lymphs Abs 3.5 0.7 - 4.0 K/uL   Monocytes Relative 8 %   Monocytes Absolute 0.9 0.1 - 1.0 K/uL   Eosinophils Relative 2 %   Eosinophils Absolute 0.3 0.0 - 0.7 K/uL   Basophils Relative 0 %   Basophils Absolute 0.0 0.0 - 0.1 K/uL  Troponin I     Status: None   Collection Time: 03/13/16 11:55 AM  Result Value Ref Range   Troponin I <0.03 <0.031 ng/mL    Comment:  NO INDICATION OF MYOCARDIAL INJURY.   Lipase, blood     Status: None   Collection Time: 03/13/16 11:55 AM  Result Value Ref Range   Lipase 11 11 - 51 U/L  Brain natriuretic peptide     Status: None   Collection Time: 03/13/16 11:55 AM  Result Value Ref Range   B Natriuretic Peptide 24.8 0.0 - 100.0 pg/mL  I-Stat CG4 Lactic Acid, ED     Status: Abnormal   Collection  Time: 03/13/16 12:04 PM  Result Value Ref Range   Lactic Acid, Venous 2.09 (HH) 0.5 - 2.0 mmol/L   Comment NOTIFIED PHYSICIAN   Culture, blood (routine x 2)     Status: None   Collection Time: 03/13/16 12:50 PM  Result Value Ref Range   Specimen Description BLOOD LEFT AC    Special Requests BOTTLES DRAWN AEROBIC AND ANAEROBIC 5ML    Culture      NO GROWTH 5 DAYS Performed at [facility]    Report Status 03/18/2016 FINAL   Culture, blood (routine x 2)     Status: None   Collection Time: 03/13/16 12:50 PM  Result Value Ref Range   Specimen Description BLOOD LEFT HAND    Special Requests BOTTLES DRAWN AEROBIC AND ANAEROBIC 2ML    Culture      NO GROWTH 5 DAYS Performed at [facility]    Report Status 03/18/2016 FINAL   Glucose, capillary     Status: Abnormal   Collection Time: 03/13/16  6:39 PM  Result Value Ref Range   Glucose-Capillary 126 (H) 65 - 99 mg/dL  MRSA PCR Screening     Status: None   Collection Time: 03/13/16  6:44 PM  Result Value Ref Range   MRSA by PCR NEGATIVE NEGATIVE    Comment:        The GeneXpert MRSA Assay (FDA approved for NASAL specimens only), is one component of a comprehensive MRSA colonization surveillance program. It is not intended to diagnose MRSA infection nor to guide or monitor treatment for MRSA infections.   HIV antibody     Status: None   Collection Time: 03/13/16  8:25 PM  Result Value Ref Range   HIV Screen 4th Generation wRfx Non Reactive Non Reactive    Comment: (NOTE) Performed At: [facility] [place], [place] [id number] Lindon Romp MD [id number]   Urinalysis, Routine w reflex microscopic     Status: Abnormal   Collection Time: 03/13/16  8:49 PM  Result Value Ref Range   Color, Urine YELLOW YELLOW   APPearance CLEAR CLEAR   Specific Gravity, Urine 1.011 1.005 - 1.030   pH 5.0 5.0 - 8.0   Glucose, UA 100 (A) NEGATIVE mg/dL   Hgb urine dipstick NEGATIVE NEGATIVE    Bilirubin Urine NEGATIVE NEGATIVE   Ketones, ur NEGATIVE NEGATIVE mg/dL   Protein, ur NEGATIVE NEGATIVE mg/dL   Nitrite NEGATIVE NEGATIVE   Leukocytes, UA NEGATIVE NEGATIVE    Comment: MICROSCOPIC NOT DONE ON URINES WITH NEGATIVE PROTEIN, BLOOD, LEUKOCYTES, NITRITE, OR GLUCOSE <1000 mg/dL.  Urine culture     Status: Abnormal   Collection Time: 03/13/16  8:49 PM  Result Value Ref Range   Specimen Description URINE, CLEAN CATCH    Special Requests NONE    Culture 1,000 COLONIES/mL INSIGNIFICANT GROWTH (A)    Report Status 03/15/2016 FINAL   Influenza panel by PCR (type A & B, H1N1)     Status: None   Collection Time:  03/13/16  8:49 PM  Result Value Ref Range   Influenza A By PCR NEGATIVE NEGATIVE   Influenza B By PCR NEGATIVE NEGATIVE   H1N1 flu by pcr NOT DETECTED NOT DETECTED    Comment:        The Xpert Flu assay (FDA approved for nasal aspirates or washes and nasopharyngeal swab specimens), is intended as an aid in the diagnosis of influenza and should not be used as a sole basis for treatment.   Strep pneumoniae urinary antigen     Status: None   Collection Time: 03/13/16  8:49 PM  Result Value Ref Range   Strep Pneumo Urinary Antigen NEGATIVE NEGATIVE    Comment:        Infection due to S. pneumoniae cannot be absolutely ruled out since the antigen present may be below the detection limit of the test.   Glucose, capillary     Status: Abnormal   Collection Time: 03/14/16 12:40 AM  Result Value Ref Range   Glucose-Capillary 248 (H) 65 - 99 mg/dL  CBC     Status: Abnormal   Collection Time: 03/14/16  3:18 AM  Result Value Ref Range   WBC 8.1 4.0 - 10.5 K/uL   RBC 4.10 (L) 4.22 - 5.81 MIL/uL   Hemoglobin 12.6 (L) 13.0 - 17.0 g/dL   HCT 35.7 (L) 39.0 - 52.0 %   MCV 87.1 78.0 - 100.0 fL   MCH 30.7 26.0 - 34.0 pg   MCHC 35.3 30.0 - 36.0 g/dL   RDW 13.0 11.5 - 15.5 %   Platelets 167 150 - 400 K/uL  Basic metabolic panel     Status: Abnormal   Collection Time:  03/14/16  3:18 AM  Result Value Ref Range   Sodium 140 135 - 145 mmol/L   Potassium 3.9 3.5 - 5.1 mmol/L   Chloride 107 101 - 111 mmol/L   CO2 22 22 - 32 mmol/L   Glucose, Bld 225 (H) 65 - 99 mg/dL   BUN 13 6 - 20 mg/dL   Creatinine, Ser 1.20 0.61 - 1.24 mg/dL   Calcium 9.3 8.9 - 10.3 mg/dL   GFR calc non Af Amer >60 >60 mL/min   GFR calc Af Amer >60 >60 mL/min    Comment: (NOTE) The eGFR has been calculated using the CKD EPI equation. This calculation has not been validated in all clinical situations. eGFR's persistently <60 mL/min signify possible Chronic Kidney Disease.    Anion gap 11 5 - 15  Glucose, capillary     Status: Abnormal   Collection Time: 03/14/16  6:48 AM  Result Value Ref Range   Glucose-Capillary 174 (H) 65 - 99 mg/dL  Glucose, capillary     Status: Abnormal   Collection Time: 03/14/16 11:41 AM  Result Value Ref Range   Glucose-Capillary 147 (H) 65 - 99 mg/dL   Comment 1 Notify RN   Glucose, capillary     Status: Abnormal   Collection Time: 03/14/16  4:27 PM  Result Value Ref Range   Glucose-Capillary 126 (H) 65 - 99 mg/dL   Comment 1 Notify RN   Glucose, capillary     Status: Abnormal   Collection Time: 03/14/16  8:50 PM  Result Value Ref Range   Glucose-Capillary 318 (H) 65 - 99 mg/dL   Comment 1 Notify RN    Comment 2 Document in Chart   Cortisol     Status: None   Collection Time: 03/15/16  4:17 AM  Result Value  Ref Range   Cortisol, Plasma 11.4 ug/dL    Comment: (NOTE) AM    6.7 - 22.6 ug/dL PM   <10.0       ug/dL   Glucose, capillary     Status: Abnormal   Collection Time: 03/15/16  6:12 AM  Result Value Ref Range   Glucose-Capillary 191 (H) 65 - 99 mg/dL   Comment 1 Notify RN    Comment 2 Document in Chart   Glucose, capillary     Status: Abnormal   Collection Time: 03/15/16 11:17 AM  Result Value Ref Range   Glucose-Capillary 188 (H) 65 - 99 mg/dL   Comment 1 Notify RN   Glucose, capillary     Status: Abnormal   Collection Time:  03/15/16  4:45 PM  Result Value Ref Range   Glucose-Capillary 280 (H) 65 - 99 mg/dL   Comment 1 Notify RN   Glucose, capillary     Status: Abnormal   Collection Time: 03/15/16  9:15 PM  Result Value Ref Range   Glucose-Capillary 348 (H) 65 - 99 mg/dL  Glucose, capillary     Status: Abnormal   Collection Time: 03/16/16  5:44 AM  Result Value Ref Range   Glucose-Capillary 272 (H) 65 - 99 mg/dL  Glucose, capillary     Status: Abnormal   Collection Time: 03/16/16 11:48 AM  Result Value Ref Range   Glucose-Capillary 244 (H) 65 - 99 mg/dL   Comment 1 Notify RN   Glucose, capillary     Status: Abnormal   Collection Time: 03/16/16  4:25 PM  Result Value Ref Range   Glucose-Capillary 206 (H) 65 - 99 mg/dL   Comment 1 Notify RN   CBC w/Diff     Status: Abnormal   Collection Time: 03/17/16 12:55 PM  Result Value Ref Range   WBC 10.6 (H) 4.0 - 10.5 K/uL   RBC 4.69 4.22 - 5.81 Mil/uL   Hemoglobin 14.5 13.0 - 17.0 g/dL   HCT 42.4 39.0 - 52.0 %   MCV 90.3 78.0 - 100.0 fl   MCHC 34.1 30.0 - 36.0 g/dL   RDW 13.8 11.5 - 15.5 %   Platelets 215.0 150.0 - 400.0 K/uL   Neutrophils Relative % 65.4 43.0 - 77.0 %   Lymphocytes Relative 25.6 12.0 - 46.0 %   Monocytes Relative 6.4 3.0 - 12.0 %   Eosinophils Relative 2.3 0.0 - 5.0 %   Basophils Relative 0.3 0.0 - 3.0 %   Neutro Abs 6.9 1.4 - 7.7 K/uL   Lymphs Abs 2.7 0.7 - 4.0 K/uL   Monocytes Absolute 0.7 0.1 - 1.0 K/uL   Eosinophils Absolute 0.2 0.0 - 0.7 K/uL   Basophils Absolute 0.0 0.0 - 0.1 K/uL  Comp Met (CMET)     Status: Abnormal   Collection Time: 03/17/16 12:55 PM  Result Value Ref Range   Sodium 134 (L) 135 - 145 mEq/L   Potassium 4.4 3.5 - 5.1 mEq/L   Chloride 100 96 - 112 mEq/L   CO2 26 19 - 32 mEq/L   Glucose, Bld 363 (H) 70 - 99 mg/dL   BUN 17 6 - 23 mg/dL   Creatinine, Ser 1.12 0.40 - 1.50 mg/dL   Total Bilirubin 0.7 0.2 - 1.2 mg/dL   Alkaline Phosphatase 86 39 - 117 U/L   AST 22 0 - 37 U/L   ALT 28 0 - 53 U/L   Total  Protein 7.6 6.0 - 8.3 g/dL   Albumin 4.7 3.5 -  5.2 g/dL   Calcium 10.0 8.4 - 10.5 mg/dL   GFR 69.40 >60.00 mL/min  Comprehensive metabolic panel     Status: Abnormal   Collection Time: 03/22/16 12:15 PM  Result Value Ref Range   Sodium 134 (L) 135 - 145 mmol/L   Potassium 4.4 3.5 - 5.1 mmol/L   Chloride 102 101 - 111 mmol/L   CO2 24 22 - 32 mmol/L   Glucose, Bld 241 (H) 65 - 99 mg/dL   BUN 18 6 - 20 mg/dL   Creatinine, Ser 1.12 0.61 - 1.24 mg/dL   Calcium 9.5 8.9 - 10.3 mg/dL   Total Protein 7.1 6.5 - 8.1 g/dL   Albumin 4.3 3.5 - 5.0 g/dL   AST 23 15 - 41 U/L   ALT 28 17 - 63 U/L   Alkaline Phosphatase 78 38 - 126 U/L   Total Bilirubin 0.8 0.3 - 1.2 mg/dL   GFR calc non Af Amer >60 >60 mL/min   GFR calc Af Amer >60 >60 mL/min    Comment: (NOTE) The eGFR has been calculated using the CKD EPI equation. This calculation has not been validated in all clinical situations. eGFR's persistently <60 mL/min signify possible Chronic Kidney Disease.    Anion gap 8 5 - 15  CBC with Differential     Status: Abnormal   Collection Time: 03/22/16 12:15 PM  Result Value Ref Range   WBC 9.6 4.0 - 10.5 K/uL   RBC 4.14 (L) 4.22 - 5.81 MIL/uL   Hemoglobin 13.1 13.0 - 17.0 g/dL   HCT 36.8 (L) 39.0 - 52.0 %   MCV 88.9 78.0 - 100.0 fL   MCH 31.6 26.0 - 34.0 pg   MCHC 35.6 30.0 - 36.0 g/dL   RDW 13.4 11.5 - 15.5 %   Platelets 208 150 - 400 K/uL   Neutrophils Relative % 68 %   Neutro Abs 6.5 1.7 - 7.7 K/uL   Lymphocytes Relative 23 %   Lymphs Abs 2.2 0.7 - 4.0 K/uL   Monocytes Relative 7 %   Monocytes Absolute 0.7 0.1 - 1.0 K/uL   Eosinophils Relative 2 %   Eosinophils Absolute 0.2 0.0 - 0.7 K/uL   Basophils Relative 0 %   Basophils Absolute 0.0 0.0 - 0.1 K/uL  Troponin I     Status: None   Collection Time: 03/22/16 12:15 PM  Result Value Ref Range   Troponin I <0.03 <0.031 ng/mL    Comment:        NO INDICATION OF MYOCARDIAL INJURY.   Ammonia     Status: None   Collection Time:  03/22/16 12:43 PM  Result Value Ref Range   Ammonia 11 9 - 35 umol/L  B Nat Peptide     Status: None   Collection Time: 03/30/16  3:11 PM  Result Value Ref Range   Brain Natriuretic Peptide 18.9 <100 pg/mL    Comment:   BNP levels increase with age in the general population with the highest values seen in individuals greater than 76 years of age. Reference: Joellyn Rued Cardiol 2002; 36:644-03.       Assessment/Plan: 1. Orthostatic hypotension Deteriorated despite multiple interventions. Hypotensive all around now. Significant fatigue. BP: (!) 78/58 mmHg in office today. Patient needs more in-depth, emergent assessment. Contacted MD in [facility] ER. Patient accompanied by nurse to ER for assessment and management.

## 2016-04-05 ENCOUNTER — Ambulatory Visit (INDEPENDENT_AMBULATORY_CARE_PROVIDER_SITE_OTHER): Payer: Commercial Managed Care - HMO | Admitting: Neurology

## 2016-04-05 DIAGNOSIS — R55 Syncope and collapse: Secondary | ICD-10-CM

## 2016-04-08 NOTE — Procedures (Signed)
[place] [facility] REPORT  Dates of Study: 04/05/2016 to 04/08/2016  Patient's Name: [name] [name] MRN: [id number] Date of Birth:  Sep 06, 1946  Indication: syncope  Medications: Baclofen, gabapentin, levothyroxine, metformin, Plavix, midodrine, Mirapez, Ranexa, sertraline, topiramate  Technical Summary: This is a multichannel digital EEG recording, using the international 10-20 placement system with electrodes applied with paste and impedances below 5000 ohms.    Description: The EEG background is symmetric, with a well-developed posterior dominant rhythm of 8-9 Hz, which is reactive to eye opening and closing.  Diffuse beta activity is seen, with a bilateral frontal preponderance.  No focal or generalized abnormalities are seen.  No focal or generalized epileptiform discharges are seen.  He did exhibit episodes of palpitations and dizziness without electrographic correlate, but he did not have any syncopal episodes.  Stage II sleep is seen, with normal and symmetric sleep patterns.  Hyperventilation and photic stimulation were not performed.  ECG revealed normal cardiac rate and rhythm.  Impression: This is a normal 72 hour ambulatory EEG , with activating procedures.   Adam R. [name], DO

## 2016-04-09 ENCOUNTER — Telehealth: Payer: Self-pay

## 2016-04-09 NOTE — Telephone Encounter (Signed)
Message relayed to patient. Verbalized understanding and denied questions.   

## 2016-04-09 NOTE — Telephone Encounter (Signed)
-----   Message from Pieter Partridge, DO sent at 04/09/2016  7:45 AM EDT ----- EEG was normal.  Although he didn't have his passing out spell, the dizzy spells revealed no abnormalities on EEG.  As recommended by cardiology, I think he should stop the muscle relaxants (baclofen, tizanidine), as they may cause drops in blood pressure.  I would like cardiology to proceed with their workup before [facility]  I would like him to proceed with neuropsychological testing, as discussed.

## 2016-04-14 ENCOUNTER — Other Ambulatory Visit: Payer: Self-pay | Admitting: Family Medicine

## 2016-04-14 ENCOUNTER — Other Ambulatory Visit: Payer: Self-pay | Admitting: Physician Assistant

## 2016-04-14 NOTE — Telephone Encounter (Signed)
Please advise dose and sig. Pt has been on multiple doses on the past, recent hospitalization in 03/2016 with 2 doses listed on discharge AVS.

## 2016-04-14 NOTE — Telephone Encounter (Signed)
Please review for refill. Thanks!  

## 2016-04-15 ENCOUNTER — Telehealth: Payer: Self-pay

## 2016-04-15 NOTE — Telephone Encounter (Signed)
Tillie Rung, PT from [facility] called. She will be discharging pt from therapy. States that pt has not had any more episodes of dizziness, headaches had also stopped, however, returned w/ in a week of no Physical Therapy. Therapist believes that headaches are originating in his neck. States that she believes it is d to the   Weak c4-c5 nerve distrubution, and pt would benefit from a cervical MRI. Last MRI was performed on  07/23/15. Therapist also stated if more PT was needed she felt he was now able to go out for PT. Please advise.

## 2016-04-15 NOTE — Telephone Encounter (Signed)
Yes, he does have disc protrusions at that area.  We wanted to try conservative treatment first.  The next step would be referral to spine specialist.

## 2016-04-15 NOTE — Telephone Encounter (Signed)
Left message on machine for pt to call back to relay messages to him.

## 2016-04-19 NOTE — Telephone Encounter (Signed)
Left message on machine for pt to return call to the office.  

## 2016-04-20 ENCOUNTER — Ambulatory Visit: Payer: Commercial Managed Care - HMO | Admitting: Internal Medicine

## 2016-04-21 ENCOUNTER — Encounter: Payer: Self-pay | Admitting: Internal Medicine

## 2016-04-23 MED FILL — PRAMIPEXOLE 1 MG TABLET: 1 | 30 days supply | Qty: 30 | Fill #0

## 2016-04-23 MED FILL — ISOSORBIDE MN ER 60 MG TAB: 60 | 30 days supply | Qty: 60 | Fill #2

## 2016-04-23 MED FILL — metFORMIN HCL 1000 MG TABS: 1000 | 30 days supply | Qty: 60 | Fill #5

## 2016-04-23 MED FILL — ATORVASTATIN 40 MG TABLET: 40 | 30 days supply | Qty: 30 | Fill #0

## 2016-04-26 ENCOUNTER — Encounter: Payer: Self-pay | Admitting: Family Medicine

## 2016-04-26 ENCOUNTER — Telehealth: Payer: Self-pay

## 2016-04-26 ENCOUNTER — Ambulatory Visit (INDEPENDENT_AMBULATORY_CARE_PROVIDER_SITE_OTHER): Payer: Commercial Managed Care - HMO | Admitting: Family Medicine

## 2016-04-26 VITALS — BP 106/62 | HR 78 | Temp 97.9°F | Ht 69.0 in | Wt 180.0 lb

## 2016-04-26 DIAGNOSIS — R51 Headache: Secondary | ICD-10-CM | POA: Diagnosis not present

## 2016-04-26 DIAGNOSIS — R209 Unspecified disturbances of skin sensation: Secondary | ICD-10-CM

## 2016-04-26 DIAGNOSIS — Z794 Long term (current) use of insulin: Secondary | ICD-10-CM

## 2016-04-26 DIAGNOSIS — R519 Headache, unspecified: Secondary | ICD-10-CM

## 2016-04-26 DIAGNOSIS — M542 Cervicalgia: Secondary | ICD-10-CM | POA: Diagnosis not present

## 2016-04-26 DIAGNOSIS — E785 Hyperlipidemia, unspecified: Secondary | ICD-10-CM

## 2016-04-26 DIAGNOSIS — L989 Disorder of the skin and subcutaneous tissue, unspecified: Secondary | ICD-10-CM | POA: Diagnosis not present

## 2016-04-26 DIAGNOSIS — E039 Hypothyroidism, unspecified: Secondary | ICD-10-CM

## 2016-04-26 DIAGNOSIS — IMO0001 Reserved for inherently not codable concepts without codable children: Secondary | ICD-10-CM

## 2016-04-26 DIAGNOSIS — E1100 Type 2 diabetes mellitus with hyperosmolarity without nonketotic hyperglycemic-hyperosmolar coma (NKHHC): Secondary | ICD-10-CM

## 2016-04-26 DIAGNOSIS — M4802 Spinal stenosis, cervical region: Secondary | ICD-10-CM

## 2016-04-26 DIAGNOSIS — M519 Unspecified thoracic, thoracolumbar and lumbosacral intervertebral disc disorder: Secondary | ICD-10-CM

## 2016-04-26 DIAGNOSIS — I10 Essential (primary) hypertension: Secondary | ICD-10-CM

## 2016-04-26 MED ORDER — INSULIN ASPART 100 UNIT/ML ~~LOC~~ SOLN: 10.0000 [IU] | Freq: Three times a day (TID) | SUBCUTANEOUS | Status: DC

## 2016-04-26 MED ORDER — INSULIN GLARGINE 300 UNIT/ML ~~LOC~~ SOPN: 42.0000 [IU] | PEN_INJECTOR | Freq: Every day | SUBCUTANEOUS | Status: DC

## 2016-04-26 MED FILL — ACCU-CHEK AVIVA PLUS TEST S: 20 days supply | Qty: 100 | Fill #2

## 2016-04-26 NOTE — Telephone Encounter (Signed)
Wife called back. Would like to go ahead w/ referral to spine specialist (please see prior telephone message).

## 2016-04-26 NOTE — Addendum Note (Signed)
Addended by: Gerda Diss A on: 04/26/2016 04:18 PM   Modules accepted: Orders

## 2016-04-26 NOTE — Telephone Encounter (Signed)
Referral sent to [facility].

## 2016-04-26 NOTE — Progress Notes (Signed)
Pre visit review using our clinic review tool, if applicable. No additional management support is needed unless otherwise documented below in the visit note. 

## 2016-04-26 NOTE — Telephone Encounter (Signed)
Wife left message on vm. Attempted to return call. Line was answered with a groan, then line disconnected. Attempted to reconnect x 2 with no success.

## 2016-04-26 NOTE — Patient Instructions (Signed)

## 2016-04-28 ENCOUNTER — Encounter: Payer: Self-pay | Admitting: Gastroenterology

## 2016-04-28 NOTE — Telephone Encounter (Signed)
Pt scheduled with [facility] for 05/05/16 @12 :30

## 2016-04-29 ENCOUNTER — Encounter: Payer: Self-pay | Admitting: Family Medicine

## 2016-05-01 ENCOUNTER — Other Ambulatory Visit (HOSPITAL_BASED_OUTPATIENT_CLINIC_OR_DEPARTMENT_OTHER): Payer: Commercial Managed Care - HMO

## 2016-05-01 ENCOUNTER — Ambulatory Visit (HOSPITAL_BASED_OUTPATIENT_CLINIC_OR_DEPARTMENT_OTHER)
Admission: RE | Admit: 2016-05-01 | Discharge: 2016-05-01 | Disposition: A | Discharge status: Home / Self Care | Payer: Commercial Managed Care - HMO | Source: Ambulatory Visit | Attending: Family Medicine | Admitting: Family Medicine

## 2016-05-01 DIAGNOSIS — M50322 Other cervical disc degeneration at C5-C6 level: Secondary | ICD-10-CM | POA: Diagnosis not present

## 2016-05-01 DIAGNOSIS — R51 Headache: Secondary | ICD-10-CM | POA: Insufficient documentation

## 2016-05-01 DIAGNOSIS — M542 Cervicalgia: Secondary | ICD-10-CM

## 2016-05-01 DIAGNOSIS — R209 Unspecified disturbances of skin sensation: Secondary | ICD-10-CM | POA: Diagnosis not present

## 2016-05-01 DIAGNOSIS — IMO0001 Reserved for inherently not codable concepts without codable children: Secondary | ICD-10-CM

## 2016-05-01 DIAGNOSIS — R519 Headache, unspecified: Secondary | ICD-10-CM

## 2016-05-01 DIAGNOSIS — M50321 Other cervical disc degeneration at C4-C5 level: Secondary | ICD-10-CM | POA: Insufficient documentation

## 2016-05-01 DIAGNOSIS — M50323 Other cervical disc degeneration at C6-C7 level: Secondary | ICD-10-CM | POA: Diagnosis not present

## 2016-05-01 DIAGNOSIS — M47892 Other spondylosis, cervical region: Secondary | ICD-10-CM | POA: Diagnosis not present

## 2016-05-03 NOTE — Assessment & Plan Note (Signed)
On Levothyroxine, continue to monitor 

## 2016-05-03 NOTE — Assessment & Plan Note (Signed)
Tolerating statin, encouraged heart healthy diet, avoid trans fats, minimize simple carbs and saturated fats. Increase exercise as tolerated 

## 2016-05-03 NOTE — Progress Notes (Signed)
Patient ID: [name] [name], male   DOB: 04-Sep-1946, 70 y.o.   MRN: [id number]   Subjective:    Patient ID: [name] [name], male    DOB: [birth year], 70 y.o.   MRN: [id number]  Chief Complaint  Patient presents with  . Follow-up    HPI Patient is in today for follow up. He continues to struggle with chronic pain, back pain, neck pain and headache. He is also noting fatigue, depression, tremulousness and polyuria. Has weakness in left hand. Denies CP/palp/SOB/congestion/fevers/GI or GU c/o. Taking meds as prescribed  Past Medical History  Diagnosis Date  . Diabetes mellitus type II, uncontrolled ([place])   . OSA (obstructive sleep apnea)   . Hyperlipidemia   . RBBB (right bundle branch block)   . Depression     Bipolar  . Edema   . Hypertension   . Falling episodes     these have occurred in the past and again recurring 2011  . Spondylosis     C5-6, C6-7 MRI 2010  . CAD (coronary artery disease)     A. CABG in 2000,status post cardiac cath in 2006, 2009 ....continued chest pain and SOB despite oral medication adjestments including Ranexa. B. Cath November 2009/ mRCA - 2.75 x 23 Abbott Xience V drug-eluting stent ...11/26/2008 to distal  RCA leading to acute marginal.  C. Cath 07/2012 for CP - stable anatomy, med rx. d. cath 2015 and 05/30/2015 stable anatomy, consider Myoview if has CP again  . Hx of CABG     2000,  / one median sternotomy suture broken her chest x-ray November, 2010, no clinical significance  . Palpitations     event recorder showed sinus rhythm  . Nephrolithiasis   . Restless leg   . Dizziness   . Shortness of breath     CPX April, 2011, mild functional limitation, no clear pulmonary or cardiac limitation, possible deconditioning and mild chronotropic incompetence( peak heart rate 130)  . Cerebral ischemia     MRI November, 2010, chronic microvascular ischemia  . Anemia     hemoglobin 7.4, iron deficiency, January, 2011, 2 unit transfusion, endoscopy normal,  capsule endoscopy February, 2011 no small bowel abnormalities.   Most likely source gastric erosions, followed by GI  . H/O medication noncompliance     Due to loss of insurance  . Ejection fraction     EF 60%, echo, July 31, 2012  . Diabetes mellitus without complication ([place])   . Pain in limb 06/12/2009    Qualifier: Diagnosis of  By: [name]   . Low back pain 06/12/2009    Qualifier: Diagnosis of  By: [name]   . Thyroid disease   . Right knee pain 01/07/2015  . Advance care planning 05/01/2015  . ACP (advance care planning) 05/11/2015  . Nausea with vomiting 01/25/2016  . Headache 01/25/2016  . Diarrhea 02/15/2016  . Gastric ulcer   . Tubulovillous adenoma of colon 2007  . Iron deficiency anemia   . Syncope 03/2016  . Chronic diastolic CHF (congestive heart failure) ([place]) 03/13/2016    Past Surgical History  Procedure Laterality Date  . Nasal septum surgery      UP3  . Coronary artery bypass graft      2000  . Left heart catheterization with coronary/graft angiogram N/A 08/01/2012    Procedure: LEFT HEART CATHETERIZATION WITH Beatrix Fetters;  Surgeon: [name], MD;  Location: [facility] CATH LAB;  Service: Cardiovascular;  Laterality: N/A;  .  Left heart catheterization with coronary/graft angiogram N/A 01/03/2015    Procedure: LEFT HEART CATHETERIZATION WITH Beatrix Fetters;  Surgeon: [name], MD;  Location: [facility] CATH LAB;  Service: Cardiovascular;  Laterality: N/A;  . Percutaneous coronary stent intervention (pci-s)  10/2008    mRCA PCI  2.75 x 23 Abbott Xience V drug-eluting stent   . Cardiac catheterization N/A 05/30/2015    Procedure: Left Heart Cath and Coronary Angiography;  Surgeon: [name], MD;  Location: [place] CV LAB;  Service: Cardiovascular;  Laterality: N/A;    Family History  Problem Relation Age of Onset  . Cancer Brother     pancreatic cancer  . Diabetes Brother     4   . Stroke Brother   . Diabetes Brother    . Cancer Brother   . Diabetes Brother   . Coronary artery disease Brother   . Diabetes Brother   . Hypothyroidism Brother   . Heart attack      Nephew  . Diabetes Mother   . Heart failure Mother   . Heart failure Father   . Irregular heart beat Daughter   . Cancer Maternal Grandmother     unknown     Social History   Social History  . Marital Status: Married    Spouse Name: N/A  . Number of Children: 4  . Years of Education: 13   Occupational History  . retired    Social History Main Topics  . Smoking status: Never Smoker   . Smokeless tobacco: Never Used  . Alcohol Use: No     Comment: stopped drinking in 1998  . Drug Use: No  . Sexual Activity:    Partners: Female   Other Topics Concern  . Not on file   Social History Narrative   Patient is right handed.   Patient does not drink caffeine.    Outpatient Prescriptions Prior to Visit  Medication Sig Dispense Refill  . atorvastatin (LIPITOR) 40 MG tablet TAKE 1 TABLET BY MOUTH DAILY AT 6PM 30 tablet 5  . baclofen (LIORESAL) 10 MG tablet Take 0.5 tablets (5 mg total) by mouth at bedtime. 30 each 0  . Cholecalciferol (DIALYVITE VITAMIN D3 MAX) 22025 units TABS Take 1 tablet by mouth once a week. (Patient taking differently: Take 1 tablet by mouth once a week. Wednesdays) 12 tablet 1  . clopidogrel (PLAVIX) 75 MG tablet Take 1 tablet (75 mg total) by mouth daily. 30 tablet 3  . clotrimazole-betamethasone (LOTRISONE) cream APPLY TO THE AFFECTED TWICE DAILY (Patient taking differently: Apply 1 application topically 2 (two) times daily as needed (dry skin). ) 30 g 6  . diclofenac sodium (VOLTAREN) 1 % GEL Apply 2 g topically 3 (three) times daily as needed. For pain to affected area (Patient taking differently: Apply 1 application topically 3 (three) times daily as needed (pain). ) 2 Tube 3  . gabapentin (NEURONTIN) 300 MG capsule Take 2 capsules (600 mg) by mouth  every morning, 1 capsule (300 mg) by mouth  at noon, and  2 capsules (600 mg) by mouth at night    . glucose blood (ACCU-CHEK AVIVA) test strip Use as directed to check blood sugar 4 times daily.  DX E11.9 200 each 6  . levothyroxine (SYNTHROID, LEVOTHROID) 75 MCG tablet Take 1 tablet (75 mcg total) by mouth daily. 90 tablet 2  . metFORMIN (GLUCOPHAGE) 1000 MG tablet Take 1 tablet (1,000 mg total) by mouth 2 (two) times daily with a meal.  60 tablet 5  . nitroGLYCERIN (NITROSTAT) 0.4 MG SL tablet Place 1 tablet (0.4 mg total) under the tongue every 5 (five) minutes as needed (up to 3 doses). (Patient taking differently: Place 0.4 mg under the tongue every 5 (five) minutes as needed for chest pain (up to 3 doses). ) 25 tablet 4  . ondansetron (ZOFRAN-ODT) 4 MG disintegrating tablet Take 1 tablet (4 mg total) by mouth every 8 (eight) hours as needed for nausea or vomiting. 20 tablet 0  . pantoprazole (PROTONIX) 40 MG tablet Take 40 mg by mouth daily.    . pramipexole (MIRAPEX) 1 MG tablet Take 1 mg by mouth at bedtime.    . ranolazine (RANEXA) 500 MG 12 hr tablet Take 500 mg by mouth 2 (two) times daily.    . sertraline (ZOLOFT) 100 MG tablet Take 1 tablet (100 mg total) by mouth 2 (two) times daily. 180 tablet 1  . tiZANidine (ZANAFLEX) 4 MG tablet Take 1 tablet (4 mg total) by mouth every 8 (eight) hours as needed for muscle spasms. 90 tablet 2  . topiramate (TOPAMAX) 50 MG tablet Take 1 tablet (50 mg total) by mouth at bedtime. 30 tablet 3  . insulin aspart (NOVOLOG) 100 UNIT/ML injection Inject 10-17 Units into the skin 3 (three) times daily with meals. Use 10-17 unit 3 times daily before meals (Patient taking differently: Inject 10-17 Units into the skin 3 (three) times daily with meals. Per sliding scale) 15 mL 4  . Insulin Glargine (TOUJEO SOLOSTAR) 300 UNIT/ML SOPN Inject 42 Units into the skin at bedtime. 3 pen 4  . midodrine (PROAMATINE) 5 MG tablet Take 1 tablet (5 mg total) by mouth 3 (three) times daily with meals. 90 tablet 0  . pramipexole  (MIRAPEX) 1 MG tablet TAKE 1/2 TO 1 TABLET BY MOUTH AT BEDTIME 30 tablet 2   No facility-administered medications prior to visit.    Allergies  Allergen Reactions  . Morphine Other (See Comments)    hallucinations  . Shellfish-Derived Products Itching    Review of Systems  Constitutional: Positive for malaise/fatigue. Negative for fever.  HENT: Negative for congestion.   Eyes: Negative for blurred vision.  Respiratory: Negative for shortness of breath.   Cardiovascular: Negative for chest pain, palpitations and leg swelling.  Gastrointestinal: Negative for nausea, abdominal pain and blood in stool.  Genitourinary: Positive for frequency. Negative for dysuria.  Musculoskeletal: Positive for myalgias, back pain and neck pain. Negative for falls.  Skin: Negative for rash.  Neurological: Positive for tremors and headaches. Negative for dizziness and loss of consciousness.  Endo/Heme/Allergies: Negative for environmental allergies.  Psychiatric/Behavioral: Negative for depression. The patient is not nervous/anxious.        Objective:    Physical Exam  Constitutional: He is oriented to person, place, and time. He appears well-developed and well-nourished. No distress.  HENT:  Head: Normocephalic and atraumatic.  Nose: Nose normal.  Eyes: Right eye exhibits no discharge. Left eye exhibits no discharge.  Neck: Normal range of motion. Neck supple.  Cardiovascular: Normal rate and regular rhythm.   Pulmonary/Chest: Effort normal and breath sounds normal.  Abdominal: Soft. Bowel sounds are normal. There is no tenderness.  Musculoskeletal: He exhibits no edema.  Neurological: He is alert and oriented to person, place, and time.  Skin: Skin is warm and dry.  Psychiatric: He has a normal mood and affect.  Nursing note and vitals reviewed.   BP 106/62 mmHg  Pulse 78  Temp(Src) 97.9 F (36.6  C) (Oral)  Ht 5\' 9"  (1.753 m)  Wt 180 lb (81.647 kg)  BMI 26.57 kg/m2  SpO2 97% Wt  Readings from Last 3 Encounters:  04/26/16 180 lb (81.647 kg)  03/30/16 178 lb 12.8 oz (81.103 kg)  03/23/16 180 lb 11.2 oz (81.965 kg)     Lab Results  Component Value Date   WBC 9.6 03/22/2016   HGB 13.1 03/22/2016   HCT 36.8* 03/22/2016   PLT 208 03/22/2016   GLUCOSE 241* 03/22/2016   CHOL 75 02/09/2016   TRIG 244.0* 02/09/2016   HDL 27.20* 02/09/2016   LDLDIRECT 25.0 02/09/2016   LDLCALC 107* 05/01/2015   ALT 28 03/22/2016   AST 23 03/22/2016   NA 134* 03/22/2016   K 4.4 03/22/2016   CL 102 03/22/2016   CREATININE 1.12 03/22/2016   BUN 18 03/22/2016   CO2 24 03/22/2016   TSH 5.287* 03/10/2016   PSA 2.39 11/25/2010   INR 1.08 11/25/2015   HGBA1C 10.0* 03/10/2016   MICROALBUR 1.1 05/01/2015    Lab Results  Component Value Date   TSH 5.287* 03/10/2016   Lab Results  Component Value Date   WBC 9.6 03/22/2016   HGB 13.1 03/22/2016   HCT 36.8* 03/22/2016   MCV 88.9 03/22/2016   PLT 208 03/22/2016   Lab Results  Component Value Date   NA 134* 03/22/2016   K 4.4 03/22/2016   CO2 24 03/22/2016   GLUCOSE 241* 03/22/2016   BUN 18 03/22/2016   CREATININE 1.12 03/22/2016   BILITOT 0.8 03/22/2016   ALKPHOS 78 03/22/2016   AST 23 03/22/2016   ALT 28 03/22/2016   PROT 7.1 03/22/2016   ALBUMIN 4.3 03/22/2016   CALCIUM 9.5 03/22/2016   ANIONGAP 8 03/22/2016   GFR 69.40 03/17/2016   Lab Results  Component Value Date   CHOL 75 02/09/2016   Lab Results  Component Value Date   HDL 27.20* 02/09/2016   Lab Results  Component Value Date   LDLCALC 107* 05/01/2015   Lab Results  Component Value Date   TRIG 244.0* 02/09/2016   Lab Results  Component Value Date   CHOLHDL 3 02/09/2016   Lab Results  Component Value Date   HGBA1C 10.0* 03/10/2016       Assessment & Plan:   Problem List Items Addressed This Visit    Spinal stenosis in cervical region    Pain worsening and weakness noted in hands along with pain, proceed with MRI of neck to evaluate  due to degree of symptoms and disease.       Hypothyroidism    On Levothyroxine, continue to monitor      HLD (hyperlipidemia)    Tolerating statin, encouraged heart healthy diet, avoid trans fats, minimize simple carbs and saturated fats. Increase exercise as tolerated      Headache   Relevant Medications   insulin aspart (NOVOLOG) 100 UNIT/ML injection   Insulin Glargine (TOUJEO SOLOSTAR) 300 UNIT/ML SOPN   Other Relevant Orders   MR Cervical Spine Wo Contrast (Completed)   Essential hypertension (Chronic)    Well controlled, no changes to meds. Encouraged heart healthy diet such as the DASH diet and exercise as tolerated.       Diabetes mellitus type II, uncontrolled ([place]) (Chronic)    Has started using his insulin more regularly numbers still running high but improving.       Relevant Medications   insulin aspart (NOVOLOG) 100 UNIT/ML injection   Insulin Glargine (TOUJEO SOLOSTAR) 300 UNIT/ML  SOPN    Other Visit Diagnoses    Neck pain    -  Primary    Relevant Medications    insulin aspart (NOVOLOG) 100 UNIT/ML injection    Insulin Glargine (TOUJEO SOLOSTAR) 300 UNIT/ML SOPN    Other Relevant Orders    MR Cervical Spine Wo Contrast (Completed)    Paresthesias/numbness        Relevant Medications    insulin aspart (NOVOLOG) 100 UNIT/ML injection    Insulin Glargine (TOUJEO SOLOSTAR) 300 UNIT/ML SOPN    Other Relevant Orders    MR Cervical Spine Wo Contrast (Completed)    Skin lesion        Relevant Orders    Ambulatory referral to Dermatology       I have discontinued Mr. Shalaby midodrine. I am also having him maintain his topiramate, diclofenac sodium, metFORMIN, sertraline, pantoprazole, tiZANidine, Cholecalciferol, clotrimazole-betamethasone, levothyroxine, nitroGLYCERIN, glucose blood, clopidogrel, baclofen, pramipexole, ranolazine, ondansetron, gabapentin, atorvastatin, insulin aspart, and Insulin Glargine.  Meds ordered this encounter  Medications  .  insulin aspart (NOVOLOG) 100 UNIT/ML injection    Sig: Inject 10-17 Units into the skin 3 (three) times daily with meals. Use 10-17 unit 3 times daily before meals    Dispense:  15 mL    Refill:  4  . Insulin Glargine (TOUJEO SOLOSTAR) 300 UNIT/ML SOPN    Sig: Inject 42 Units into the skin at bedtime.    Dispense:  3 pen    Refill:  4     Penni Homans, MD

## 2016-05-03 NOTE — Assessment & Plan Note (Signed)
Has started using his insulin more regularly numbers still running high but improving.

## 2016-05-03 NOTE — Assessment & Plan Note (Signed)
Pain worsening and weakness noted in hands along with pain, proceed with MRI of neck to evaluate due to degree of symptoms and disease.

## 2016-05-03 NOTE — Assessment & Plan Note (Signed)
Well controlled, no changes to meds. Encouraged heart healthy diet such as the DASH diet and exercise as tolerated.  °

## 2016-05-04 ENCOUNTER — Other Ambulatory Visit: Payer: Self-pay | Admitting: Family Medicine

## 2016-05-04 ENCOUNTER — Telehealth: Payer: Self-pay | Admitting: Family Medicine

## 2016-05-04 DIAGNOSIS — M542 Cervicalgia: Secondary | ICD-10-CM

## 2016-05-04 NOTE — Telephone Encounter (Signed)
error:[id number] ° °

## 2016-05-10 ENCOUNTER — Encounter: Payer: Self-pay | Admitting: Internal Medicine

## 2016-05-10 ENCOUNTER — Ambulatory Visit (INDEPENDENT_AMBULATORY_CARE_PROVIDER_SITE_OTHER): Payer: Commercial Managed Care - HMO | Admitting: Internal Medicine

## 2016-05-10 VITALS — BP 120/70 | HR 76 | Ht 69.0 in | Wt 185.2 lb

## 2016-05-10 DIAGNOSIS — R002 Palpitations: Secondary | ICD-10-CM | POA: Diagnosis not present

## 2016-05-10 DIAGNOSIS — R55 Syncope and collapse: Secondary | ICD-10-CM | POA: Diagnosis not present

## 2016-05-10 DIAGNOSIS — I951 Orthostatic hypotension: Secondary | ICD-10-CM | POA: Diagnosis not present

## 2016-05-10 NOTE — Progress Notes (Signed)
ELECTROPHYSIOLOGY CONSULT NOTE  Patient ID: [name] [name], MRN: [id number], DOB/AGE: 08-18-1946 70 y.o. Admit date: (Not on file) Date of Consult: 05/10/2016  Primary Physician: Penni Homans, MD Primary Cardiologist: Saundra Shelling Consulting Physician SW  Chief Complaint: spells   HPI [name] [name] is a 70 y.o. male  Referred because of spells. These are poorly characterized and are quite varied. He has frequent auditory hallucinations. His wife describes abrupt onset of spells where he becomes confused and forgetful and that these can last days. He has no recollection of these events afterwards.  He has lightheadedness associated with bending and with standing. The spells are not attenuated by sitting and/or lying down. He was not  described as pale with these. On one occasion EMS was called and his blood pressure was normal/high. He was given ProAmatine. He noted no improvement. He is wearing thigh-high support stockings.  He has some degree of neuropathy in his feet; he does not have gastroparesis symptoms  He has a history of recurrent neurological events characterized by confusion and near syncope slurred speech and confusion tremors and a right facial droop. He has a history of multiple concussions and has been followed by neurology. Prolonged EEG monitoring has been pursued.   His history of hypertension and diabetes  He has a history of coronary artery disease with prior bypass surgery in 2000 stenting in 2009. Cath 2016>>>>Prox LAD to Mid LAD lesion, 90% stenosed. Mid LAD to Dist LAD lesion, 100% stenosed  within a bare metal stent (from Pre-CABG).   2nd Diag to 2nd Diag lesion, 100% stenosed within a BMS (pre-CABG)  Widely patent DES stent to the mid RCA just prior to the bifurcation into RPDA and RPAV  Widely patent LIMA to a Small downstream distal LAD - minimal CAD  Widely patent saphenous vein grafts to the D1, D2 and RPAV     He denies chest pain. He is able to climb  a flight of stairs albeit slowly      Past Medical History  Diagnosis Date  . Diabetes mellitus type II, uncontrolled ([place])   . OSA (obstructive sleep apnea)   . Hyperlipidemia   . RBBB (right bundle branch block)   . Depression     Bipolar  . Edema   . Hypertension   . Falling episodes     these have occurred in the past and again recurring 2011  . Spondylosis     C5-6, C6-7 MRI 2010  . CAD (coronary artery disease)     A. CABG in 2000,status post cardiac cath in 2006, 2009 ....continued chest pain and SOB despite oral medication adjestments including Ranexa. B. Cath November 2009/ mRCA - 2.75 x 23 Abbott Xience V drug-eluting stent ...11/26/2008 to distal  RCA leading to acute marginal.  C. Cath 07/2012 for CP - stable anatomy, med rx. d. cath 2015 and 05/30/2015 stable anatomy, consider Myoview if has CP again  . Hx of CABG     2000,  / one median sternotomy suture broken her chest x-ray November, 2010, no clinical significance  . Palpitations     event recorder showed sinus rhythm  . Nephrolithiasis   . Restless leg   . Dizziness   . Shortness of breath     CPX April, 2011, mild functional limitation, no clear pulmonary or cardiac limitation, possible deconditioning and mild chronotropic incompetence( peak heart rate 130)  . Cerebral ischemia     MRI November, 2010, chronic microvascular ischemia  .  Anemia     hemoglobin 7.4, iron deficiency, January, 2011, 2 unit transfusion, endoscopy normal, capsule endoscopy February, 2011 no small bowel abnormalities.   Most likely source gastric erosions, followed by GI  . H/O medication noncompliance     Due to loss of insurance  . Ejection fraction     EF 60%, echo, July 31, 2012  . Diabetes mellitus without complication ([place])   . Pain in limb 06/12/2009    Qualifier: Diagnosis of  By: [name]   . Low back pain 06/12/2009    Qualifier: Diagnosis of  By: [name]   . Thyroid disease   . Right knee pain 01/07/2015    . Advance care planning 05/01/2015  . ACP (advance care planning) 05/11/2015  . Nausea with vomiting 01/25/2016  . Headache 01/25/2016  . Diarrhea 02/15/2016  . Gastric ulcer   . Tubulovillous adenoma of colon 2007  . Iron deficiency anemia   . Syncope 03/2016  . Chronic diastolic CHF (congestive heart failure) ([place]) 03/13/2016      Surgical History:  Past Surgical History  Procedure Laterality Date  . Nasal septum surgery      UP3  . Coronary artery bypass graft      2000  . Left heart catheterization with coronary/graft angiogram N/A 08/01/2012    Procedure: LEFT HEART CATHETERIZATION WITH Beatrix Fetters;  Surgeon: [name], MD;  Location: [facility] CATH LAB;  Service: Cardiovascular;  Laterality: N/A;  . Left heart catheterization with coronary/graft angiogram N/A 01/03/2015    Procedure: LEFT HEART CATHETERIZATION WITH Beatrix Fetters;  Surgeon: [name], MD;  [facility] LAB;  Service: Cardiovascular;  Laterality: N/A;  . Percutaneous coronary stent intervention (pci-s)  10/2008    mRCA PCI  2.75 x 23 Abbott Xience V drug-eluting stent   . Cardiac catheterization N/A 05/30/2015    Procedure: Left Heart Cath and Coronary Angiography;  Surgeon: [name], MD;  Location: [place] CV LAB;  Service: Cardiovascular;  Laterality: N/A;     Home Meds: Prior to Admission medications   Medication Sig Start Date End Date Taking? Authorizing Provider  atorvastatin (LIPITOR) 40 MG tablet TAKE 1 TABLET BY MOUTH DAILY AT [facility] 04/14/16   Almyra Deforest, PA  baclofen (LIORESAL) 10 MG tablet Take 0.5 tablets (5 mg total) by mouth at bedtime. 03/11/16   Bonnielee Haff, MD  Cholecalciferol (DIALYVITE VITAMIN D3 MAX) 60454 units TABS Take 1 tablet by mouth once a week. Patient taking differently: Take 1 tablet by mouth once a week. Wednesdays 01/16/16   [name], MD  clopidogrel (PLAVIX) 75 MG tablet Take 1 tablet (75 mg total) by mouth daily. 03/11/16   Bonnielee Haff,  MD  clotrimazole-betamethasone (LOTRISONE) cream APPLY TO THE AFFECTED TWICE DAILY Patient taking differently: Apply 1 application topically 2 (two) times daily as needed (dry skin).  02/09/16   [name], MD  diclofenac sodium (VOLTAREN) 1 % GEL Apply 2 g topically 3 (three) times daily as needed. For pain to affected area Patient taking differently: Apply 1 application topically 3 (three) times daily as needed (pain).  05/01/15   [name], MD  gabapentin (NEURONTIN) 300 MG capsule Take 2 capsules (600 mg) by mouth  every morning, 1 capsule (300 mg) by mouth  at noon, and 2 capsules (600 mg) by mouth at night    Historical Provider, MD  glucose blood (ACCU-CHEK AVIVA) test strip Use as directed to check  blood sugar 4 times daily.  DX E11.9 02/09/16   [name], MD  insulin aspart (NOVOLOG) 100 UNIT/ML injection Inject 10-17 Units into the skin 3 (three) times daily with meals. Use 10-17 unit 3 times daily before meals 04/26/16   [name], MD  Insulin Glargine (TOUJEO SOLOSTAR) 300 UNIT/ML SOPN Inject 42 Units into the skin at bedtime. 04/26/16   [name], MD  levothyroxine (SYNTHROID, LEVOTHROID) 75 MCG tablet Take 1 tablet (75 mcg total) by mouth daily. 02/09/16   [name], MD  metFORMIN (GLUCOPHAGE) 1000 MG tablet Take 1 tablet (1,000 mg total) by mouth 2 (two) times daily with a meal. 06/12/15   [name], MD  nitroGLYCERIN (NITROSTAT) 0.4 MG SL tablet Place 1 tablet (0.4 mg total) under the tongue every 5 (five) minutes as needed (up to 3 doses). Patient taking differently: Place 0.4 mg under the tongue every 5 (five) minutes as needed for chest pain (up to 3 doses).  02/09/16   [name], MD  ondansetron (ZOFRAN-ODT) 4 MG disintegrating tablet Take 1 tablet (4 mg total) by mouth every 8 (eight) hours as needed for nausea or vomiting. 03/17/16   Brunetta Jeans, PA-C  pantoprazole (PROTONIX) 40 MG tablet Take 40 mg by mouth daily.    Historical Provider, MD   pramipexole (MIRAPEX) 1 MG tablet Take 1 mg by mouth at bedtime. 03/08/16   Historical Provider, MD  ranolazine (RANEXA) 500 MG 12 hr tablet Take 500 mg by mouth 2 (two) times daily.    Historical Provider, MD  sertraline (ZOLOFT) 100 MG tablet Take 1 tablet (100 mg total) by mouth 2 (two) times daily. 07/24/15   [name], MD  tiZANidine (ZANAFLEX) 4 MG tablet Take 1 tablet (4 mg total) by mouth every 8 (eight) hours as needed for muscle spasms. 11/19/15   Pieter Partridge, DO  topiramate (TOPAMAX) 50 MG tablet Take 1 tablet (50 mg total) by mouth at bedtime. 04/25/15   Pieter Partridge, DO    Allergies:  Allergies  Allergen Reactions  . Morphine Other (See Comments)    hallucinations  . Shellfish-Derived Products Itching    Social History   Social History  . Marital Status: Married    Spouse Name: N/A  . Number of Children: 4  . Years of Education: 13   Occupational History  . retired    Social History Main Topics  . Smoking status: Never Smoker   . Smokeless tobacco: Never Used  . Alcohol Use: No     Comment: stopped drinking in 1998  . Drug Use: No  . Sexual Activity:    Partners: Female   Other Topics Concern  . Not on file   Social History Narrative   Patient is right handed.   Patient does not drink caffeine.     Family History  Problem Relation Age of Onset  . Cancer Brother     pancreatic cancer  . Diabetes Brother     4   . Stroke Brother   . Diabetes Brother   . Cancer Brother   . Diabetes Brother   . Coronary artery disease Brother   . Diabetes Brother   . Hypothyroidism Brother   . Heart attack      Nephew  . Diabetes Mother   . Heart failure Mother   . Heart failure Father   . Irregular heart beat Daughter   . Cancer Maternal Grandmother  unknown      ROS:  Please see the history of present illness.     All other systems reviewed and negative.    Physical Exam: Blood pressure 120/70, pulse 76, height 5\' 9"  (1.753 m), weight 185 lb 3.2  oz (84.006 kg). General: Well developed, well nourished male in no acute distress. Head: Normocephalic, atraumatic, sclera non-icteric, no xanthomas, nares are without discharge. EENT: normal  Lymph Nodes:  none Neck: Negative for carotid bruits. JVD not elevated. Back:without scoliosis kyphosis Lungs: Clear bilaterally to auscultation without wheezes, rales, or rhonchi. Breathing is unlabored. Heart: RRR with S1 S2. No  murmur . No rubs, or gallops appreciated. Abdomen: Soft, non-tender, non-distended with normoactive bowel sounds. No hepatomegaly. No rebound/guarding. No obvious abdominal masses. Msk:  Strength and tone appear normal for age. Extremities: No clubbing or cyanosis. No edema.  Distal pedal pulses are 2+ and equal bilaterally. Skin: Warm and Dry Neuro: Alert and oriented X 3. CN III-XII intact Grossly normal sensory and motor function . Psych:  Responds to questions appropriately with a normal affect.      Labs: Cardiac Enzymes No results for input(s): CKTOTAL, CKMB, TROPONINI in the last 72 hours. CBC Lab Results  Component Value Date   WBC 9.6 03/22/2016   HGB 13.1 03/22/2016   HCT 36.8* 03/22/2016   MCV 88.9 03/22/2016   PLT 208 03/22/2016   PROTIME: No results for input(s): LABPROT, INR in the last 72 hours. Chemistry No results for input(s): NA, K, CL, CO2, BUN, CREATININE, CALCIUM, PROT, BILITOT, ALKPHOS, ALT, AST, GLUCOSE in the last 168 hours.  Invalid input(s): LABALBU Lipids Lab Results  Component Value Date   CHOL 75 02/09/2016   HDL 27.20* 02/09/2016   LDLCALC 107* 05/01/2015   TRIG 244.0* 02/09/2016   BNP No results found for: PROBNP Thyroid Function Tests: No results for input(s): TSH, T4TOTAL, T3FREE, THYROIDAB in the last 72 hours.  Invalid input(s): FREET3 Miscellaneous Lab Results  Component Value Date   DDIMER <0.27 05/30/2015    Radiology/Studies:  Mr Cervical Spine Wo Contrast  05/01/2016  CLINICAL DATA:  Left neck pain.  Pain in the left head. Bilateral arm pain with numbness and tingling. Bilateral arm weakness. EXAM: MRI CERVICAL SPINE WITHOUT CONTRAST TECHNIQUE: Multiplanar, multisequence MR imaging of the cervical spine was performed. No intravenous contrast was administered. COMPARISON:  Multiple exams, including 07/23/2015 FINDINGS: Despite efforts by the technologist and patient, motion artifact is present on today's exam and could not be eliminated. This reduces exam sensitivity and specificity. Alignment: No vertebral subluxation is observed. Vertebrae: Mild degenerative endplate findings at [id number]. Disc desiccation throughout cervical spine. No significant vertebral marrow edema is identified. Cord: No significant abnormal spinal cord signal is observed. Posterior Fossa, vertebral arteries, paraspinal tissues: Unremarkable Disc levels: C2-3:  No impingement.  Mild left facet arthropathy. C3-4: Moderate right and mild left foraminal stenosis due to uncinate spurring, disc bulge, and facet arthropathy. Mild central narrowing of the thecal sac due to central disc protrusion. C4-5: Moderate left and mild right foraminal stenosis and mild central stenosis due to central disc protrusion, uncinate spurring, and facet arthropathy. C5-6: Moderate central narrowing of the thecal sac and mild left foraminal stenosis due to disc bulge, uncinate spurring, and facet arthropathy. C6-7. Moderate left and mild to moderate right foraminal stenosis and borderline central narrowing of the thecal sac due to central disc protrusion, uncinate spurring, facet arthropathy, and disc bulge. C7-T1:  No impingement.  Mild right facet arthropathy. IMPRESSION: 1.  Cervical spondylosis and degenerative disc disease, causing moderate impingement at C3-4, C4-5, C5-6, and C6-7, as detailed above. Overall the degree of impingement is similar to the prior exam from 07/23/2015. Electronically Signed   By: Van Clines [name]   On: 05/01/2016 15:17    EKG:  Dated 425 sinus rhythm at 89 Intervals 16/13/39 right bundle branch block   Assessment and Plan:  Lightheadedness  Palpitations  Coronary artery disease  Diabetes  Orthostatic lightheadedness-atypical   Preoperative evaluation     The patient has a variety of spells some of which are vaguely familiar and others of which are not. Auditory hallucinations are common; it is his impression and his wife's impression that these are not clearly related to his spells of confusion and forgetfulness. The fact that these last as long as days speaks against there being cardiovascular in origin.  He also has some degree of orthostatic lightheadedness; notably however, symptoms are not ameliorated by sitting and/or lying down. He has not been described as pale. These also suggest a noncardiovascular cause although the triggering by standing and the similarity to exposure to hot water showers makes this problem less clearly not cardiovascular. He is wearing thigh-high support stockings; I've recommended that he use an abdominal binder to see if we can attenuate some of his symptoms. It was not clear to the family whether the ProAmatine was of any benefi  Mechanism of his palpitations has not been elucidated. The Holter monitor at [facility] but unfortunately no associated symptoms   he also is requesting preoperative clearance for fusion surgery for his neck. He has a functional status of the probably about 4 METS   Virl Axe

## 2016-05-10 NOTE — Patient Instructions (Signed)
Medication Instructions: - Your physician recommends that you continue on your current medications as directed. Please refer to the Current Medication list given to you today.  Labwork: - none  Procedures/Testing: - Your physician has recommended that you wear an event monitor. Event monitors are medical devices that record the heart's electrical activity. Doctors most often [place] these monitors to diagnose arrhythmias. Arrhythmias are problems with the speed or rhythm of the heartbeat. The monitor is a small, portable device. You can wear one while you do your normal daily activities. This is usually used to diagnose what is causing palpitations/syncope (passing out).  Follow-Up: - Your physician recommends that you schedule a follow-up appointment - with [name] in 5-6 weeks  - Dr. Caryl Comes will see you back on an as needed basis.  Any Additional Special Instructions Will Be Listed Below (If Applicable). - Please wear an abdominal binder as instructed    If you need a refill on your cardiac medications before your next appointment, please call your pharmacy.

## 2016-05-10 NOTE — Progress Notes (Signed)
Surgical clearance form faxed to [place] (Dr. Annette Stable) at [phone] for the patient's upcoming back surgery. Per Dr. Caryl Comes- ok to hold plavix x 7 days. Confirmation received.

## 2016-05-11 ENCOUNTER — Ambulatory Visit (INDEPENDENT_AMBULATORY_CARE_PROVIDER_SITE_OTHER): Payer: Commercial Managed Care - HMO

## 2016-05-11 DIAGNOSIS — R002 Palpitations: Secondary | ICD-10-CM | POA: Diagnosis not present

## 2016-05-14 ENCOUNTER — Other Ambulatory Visit: Payer: Self-pay | Admitting: Neurosurgery

## 2016-05-14 ENCOUNTER — Encounter: Payer: Self-pay | Admitting: Pulmonary Disease

## 2016-05-14 ENCOUNTER — Ambulatory Visit (INDEPENDENT_AMBULATORY_CARE_PROVIDER_SITE_OTHER): Payer: Commercial Managed Care - HMO | Admitting: Pulmonary Disease

## 2016-05-14 VITALS — BP 118/62 | HR 75 | Ht 69.0 in | Wt 183.0 lb

## 2016-05-14 DIAGNOSIS — G4733 Obstructive sleep apnea (adult) (pediatric): Secondary | ICD-10-CM | POA: Diagnosis not present

## 2016-05-14 DIAGNOSIS — R0609 Other forms of dyspnea: Secondary | ICD-10-CM | POA: Diagnosis not present

## 2016-05-14 DIAGNOSIS — G2581 Restless legs syndrome: Secondary | ICD-10-CM | POA: Diagnosis not present

## 2016-05-14 NOTE — Patient Instructions (Signed)
It was a pleasure taking care of you today!  We will schedule you to have a sleep study to determine if you have sleep apnea.    We will get a lab sleep study.  You will be scheduled to have a lab sleep study in 4-6 weeks.  Someone from the sleep lab will call you in 2-3 days to schedule the study with you.  They usually have cancellations every night so most likely, they will have openings for a lab sleep study next week or so.  We encourage you to do your sleep study then if possible. Please give [place] a call in a week is no one from the sleep lab calls you in 2-3 days.   If the sleep study is positive, we will order you a CPAP  machine.  Please call the office if you do NOT receive your machine in the next 1-2 weeks.   Please make sure you use your CPAP device everytime you sleep.  We will monitor the usage of your machine per your insurance requirement.  Your insurance company may take the machine from you if you are not using it regularly.   Please clean the mask, tubings, filter, water reservoir with soapy water every week.  Please use distilled water for the water reservoir.   Please call the office or your machine provider (DME company) if you are having issues with the device.   Return to clinic in 2-3 months with either Dr. Corrie Dandy or NP.

## 2016-05-14 NOTE — Assessment & Plan Note (Signed)
Pt with CAD. Follows with cards. On holter monitor now. Non smoker. Recent SOB. Suggested pft, abg. Pt wanted to hold off. Wife has copd.  CTA (03/2016) atelectasis at bases.

## 2016-05-14 NOTE — Progress Notes (Signed)
Subjective:    Patient ID: [name] [name], male    DOB: [birth year], 70 y.o.   MRN: [id number]  HPI   This is the case of [name] [name], 70 y.o. Male, who was referred by Dr. Penni Homans in consultation regarding OSA.   As you very well know, patient is a  non smoker, no copd or asthma by history, was dxed with OSA in 2000.  Unsure severity. Used cpap for < 1 month. He stopped using it 2/2 too much pressure.   He had nasal/mouth surgery in 2005 which helped with snoring. Snoring has been back x 5 yrs.   Has hypersomnia, he can sleep all day.  Has snoring, witnessed apneas, gasps, choking.  Moves his legs frequently at sleep/ Has sleep talking. No other abn behavior in sleep.   Sleeps 4-5 hrs at night.  Naps daily.  Feels unrefreshed when he wakes up in am.   Pt also has CAD. Currently on a 1 month holter.   Not on o2.   No recent hospitalization or infection.       Review of Systems  Constitutional: Negative.  Negative for fever and unexpected weight change.  HENT: Negative for congestion, dental problem, ear pain, nosebleeds, postnasal drip, rhinorrhea, sinus pressure, sneezing, sore throat and trouble swallowing.   Eyes: Negative.  Negative for redness and itching.  Respiratory: Positive for shortness of breath. Negative for cough, chest tightness and wheezing.   Cardiovascular: Positive for palpitations. Negative for leg swelling.  Gastrointestinal: Negative.  Negative for nausea and vomiting.  Endocrine: Negative.   Genitourinary: Negative.  Negative for dysuria.  Musculoskeletal: Positive for arthralgias and neck pain. Negative for joint swelling.  Skin: Negative.  Negative for rash.  Allergic/Immunologic: Negative.   Neurological: Positive for dizziness, light-headedness and headaches.  Hematological: Bruises/bleeds easily.  Psychiatric/Behavioral: Negative.  Negative for dysphoric mood. The patient is not nervous/anxious.    Past Medical History    Diagnosis Date  . Diabetes mellitus type II, uncontrolled ([place])   . OSA (obstructive sleep apnea)   . Hyperlipidemia   . RBBB (right bundle branch block)   . Depression     Bipolar  . Edema   . Hypertension   . Falling episodes     these have occurred in the past and again recurring 2011  . Spondylosis     C5-6, C6-7 MRI 2010  . CAD (coronary artery disease)     A. CABG in 2000,status post cardiac cath in 2006, 2009 ....continued chest pain and SOB despite oral medication adjestments including Ranexa. B. Cath November 2009/ mRCA - 2.75 x 23 Abbott Xience V drug-eluting stent ...11/26/2008 to distal  RCA leading to acute marginal.  C. Cath 07/2012 for CP - stable anatomy, med rx. d. cath 2015 and 05/30/2015 stable anatomy, consider Myoview if has CP again  . Hx of CABG     2000,  / one median sternotomy suture broken her chest x-ray November, 2010, no clinical significance  . Palpitations     event recorder showed sinus rhythm  . Nephrolithiasis   . Restless leg   . Dizziness   . Shortness of breath     CPX April, 2011, mild functional limitation, no clear pulmonary or cardiac limitation, possible deconditioning and mild chronotropic incompetence( peak heart rate 130)  . Cerebral ischemia     MRI November, 2010, chronic microvascular ischemia  . Anemia     hemoglobin 7.4, iron deficiency, January, 2011, 2 unit transfusion,  endoscopy normal, capsule endoscopy February, 2011 no small bowel abnormalities.   Most likely source gastric erosions, followed by GI  . H/O medication noncompliance     Due to loss of insurance  . Ejection fraction     EF 60%, echo, July 31, 2012  . Diabetes mellitus without complication ([place])   . Pain in limb 06/12/2009    Qualifier: Diagnosis of  By: [name]   . Low back pain 06/12/2009    Qualifier: Diagnosis of  By: [name]   . Thyroid disease   . Right knee pain 01/07/2015  . Advance care planning 05/01/2015  . ACP (advance care  planning) 05/11/2015  . Nausea with vomiting 01/25/2016  . Headache 01/25/2016  . Diarrhea 02/15/2016  . Gastric ulcer   . Tubulovillous adenoma of colon 2007  . Iron deficiency anemia   . Syncope 03/2016  . Chronic diastolic CHF (congestive heart failure) ([place]) 03/13/2016   (-) CA, DVT  Family History  Problem Relation Age of Onset  . Cancer Brother     pancreatic cancer  . Diabetes Brother     4   . Stroke Brother   . Diabetes Brother   . Cancer Brother   . Diabetes Brother   . Coronary artery disease Brother   . Diabetes Brother   . Hypothyroidism Brother   . Heart attack      Nephew  . Diabetes Mother   . Heart failure Mother   . Heart failure Father   . Irregular heart beat Daughter   . Cancer Maternal Grandmother     unknown      Past Surgical History  Procedure Laterality Date  . Nasal septum surgery      UP3  . Coronary artery bypass graft      2000  . Left heart catheterization with coronary/graft angiogram N/A 08/01/2012    Procedure: LEFT HEART CATHETERIZATION WITH Beatrix Fetters;  Surgeon: [name], MD;  Location: [facility] CATH LAB;  Service: Cardiovascular;  Laterality: N/A;  . Left heart catheterization with coronary/graft angiogram N/A 01/03/2015    Procedure: LEFT HEART CATHETERIZATION WITH Beatrix Fetters;  Surgeon: [name], MD;  Location: [facility] CATH LAB;  Service: Cardiovascular;  Laterality: N/A;  . Percutaneous coronary stent intervention (pci-s)  10/2008    mRCA PCI  2.75 x 23 Abbott Xience V drug-eluting stent   . Cardiac catheterization N/A 05/30/2015    Procedure: Left Heart Cath and Coronary Angiography;  Surgeon: [name], MD;  Location: [facility] CV LAB;  Service: Cardiovascular;  Laterality: N/A;    Social History   Social History  . Marital Status: Married    Spouse Name: N/A  . Number of Children: 4  . Years of Education: 13   Occupational History  . retired    Social History Main Topics  . Smoking  status: Never Smoker   . Smokeless tobacco: Never Used  . Alcohol Use: No     Comment: stopped drinking in 1998  . Drug Use: No  . Sexual Activity:    Partners: Female   Other Topics Concern  . Not on file   Social History Narrative   Patient is right handed.   Patient does not drink caffeine.   Married with 4 daughters. Worked in a [employer]. (-) ETOH.    Allergies  Allergen Reactions  . Morphine Other (See Comments)    hallucinations  . Shellfish-Derived Products Itching  Outpatient Prescriptions Prior to Visit  Medication Sig Dispense Refill  . atorvastatin (LIPITOR) 40 MG tablet TAKE 1 TABLET BY MOUTH DAILY AT 6PM 30 tablet 5  . baclofen (LIORESAL) 10 MG tablet Take 0.5 tablets (5 mg total) by mouth at bedtime. 30 each 0  . clopidogrel (PLAVIX) 75 MG tablet Take 1 tablet (75 mg total) by mouth daily. 30 tablet 3  . clotrimazole-betamethasone (LOTRISONE) cream Apply 1 application topically 2 (two) times daily.    . diclofenac sodium (VOLTAREN) 1 % GEL Apply 2 g topically daily as needed (for pain in affected area).    . gabapentin (NEURONTIN) 300 MG capsule Take 2 capsules (600 mg) by mouth  every morning, 1 capsule (300 mg) by mouth  at noon, and 2 capsules (600 mg) by mouth at night    . glucose blood (ACCU-CHEK AVIVA) test strip Use as directed to check blood sugar 4 times daily.  DX E11.9 200 each 6  . insulin aspart (NOVOLOG) 100 UNIT/ML injection Inject 10-17 Units into the skin 3 (three) times daily with meals. Use 10-17 unit 3 times daily before meals 15 mL 4  . Insulin Glargine (TOUJEO SOLOSTAR) 300 UNIT/ML SOPN Inject 42 Units into the skin at bedtime. 3 pen 4  . isosorbide mononitrate (IMDUR) 120 MG 24 hr tablet Take 120 mg by mouth daily.    Marland Kitchen levothyroxine (SYNTHROID, LEVOTHROID) 75 MCG tablet Take 1 tablet (75 mcg total) by mouth daily. 90 tablet 2  . metFORMIN (GLUCOPHAGE) 1000 MG tablet Take 1 tablet (1,000 mg total) by mouth 2 (two) times daily with a  meal. 60 tablet 5  . nitroGLYCERIN (NITROSTAT) 0.4 MG SL tablet Place 0.4 mg under the tongue every 5 (five) minutes as needed for chest pain.    Marland Kitchen ondansetron (ZOFRAN-ODT) 4 MG disintegrating tablet Take 1 tablet (4 mg total) by mouth every 8 (eight) hours as needed for nausea or vomiting. 20 tablet 0  . pantoprazole (PROTONIX) 40 MG tablet Take 40 mg by mouth daily.    . pramipexole (MIRAPEX) 1 MG tablet Take 1 mg by mouth at bedtime.    . ranolazine (RANEXA) 500 MG 12 hr tablet Take 500 mg by mouth 2 (two) times daily.    . sertraline (ZOLOFT) 100 MG tablet Take 1 tablet (100 mg total) by mouth 2 (two) times daily. 180 tablet 1  . tiZANidine (ZANAFLEX) 4 MG tablet Take 1 tablet (4 mg total) by mouth every 8 (eight) hours as needed for muscle spasms. 90 tablet 2  . topiramate (TOPAMAX) 50 MG tablet Take 1 tablet (50 mg total) by mouth at bedtime. 30 tablet 3   No facility-administered medications prior to visit.   No orders of the defined types were placed in this encounter.           Objective:   Physical Exam  Vitals:  Filed Vitals:   05/14/16 1016  BP: 118/62  Pulse: 75  Height: 5\' 9"  (1.753 m)  Weight: 183 lb (83.008 kg)  SpO2: 96%    Constitutional/General:  Pleasant, well-nourished, well-developed, not in any distress,  Comfortably seating.  Well kempt  Body mass index is 27.01 kg/(m^2). Wt Readings from Last 3 Encounters:  05/14/16 183 lb (83.008 kg)  05/10/16 185 lb 3.2 oz (84.006 kg)  04/26/16 180 lb (81.647 kg)    Neck circumference: 16 in  HEENT: Pupils equal and reactive to light and accommodation. Anicteric sclerae. Normal nasal mucosa.   No oral  lesions,  mouth clear,  oropharynx clear, no postnasal drip. (-) Oral thrush. No dental caries. S/P UP3 Airway - Mallampati class III  Neck: No masses. Midline trachea. No JVD, (-) LAD. (-) bruits appreciated.  Respiratory/Chest: Grossly normal chest. (-) deformity. (-) Accessory muscle use.  Symmetric  expansion. (-) Tenderness on palpation.  Resonant on percussion.  Diminished BS on both lower lung zones. (-) wheezing, rhonchi. Some crackles at bases.  (-) egophony  Cardiovascular: Regular rate and  rhythm, heart sounds normal, no murmur or gallops, no peripheral edema  Gastrointestinal:  Normal bowel sounds. Soft, non-tender. No hepatosplenomegaly.  (-) masses.   Musculoskeletal:  Normal muscle tone. Normal gait.   Extremities: Grossly normal. (-) clubbing, cyanosis.  (-) edema  Skin: (-) rash,lesions seen.   Neurological/Psychiatric : alert, oriented to time, place, person. Normal mood and affect            Assessment & Plan:  OSA (obstructive sleep apnea) Pt was dxed with OSA in 2000.  Unsure severity. Used cpap for < 1 month. He stopped using it 2/2 too much pressure.  He had nasal/mouth surgery in 2005 which helped with snoring. Snoring has been back x 5 yrs.   Has hypersomnia, he can sleep all day.  Has snoring, witnessed apneas, gasps, choking.  Moves his legs frequently at sleep/ Has sleep talking. No other abn behavior in sleep.   Sleeps 4-5 hrs at night.  Naps daily.  Feels unrefreshed when he wakes up in am.   Pt also has CAD. Currently on a 1 month holter.   Plan:   Patient has snoring, hypersomnia, witnessed apneas, choking, gasping during sleep.  ESS is 12.   We discussed about the diagnosis of Obstructive Sleep Apnea (OSA) and implications of untreated OSA. We discussed about CPAP and BiPaP as possible treatment options.   We will schedule the patient for a sleep study. Needs a split night study. Hopefully done next week.    Patient was instructed to call the office if he/she has not heard back from the office 1-2 weeks after the sleep study.   Patient was instructed to call the office if he/she is having issues with the PAP device.   We discussed good sleep hygiene.   Patient was advised not to engage in activities requiring concentration  and/or vigilance if he/she is sleepy.  Patient was advised not to drive if he/she is sleepy.       Restless leg Cont Mirapex. Still have leg issues. Rx osa and observe RLS.   Exertional dyspnea Pt with CAD. Follows with cards. On holter monitor now. Non smoker. Recent SOB. Suggested pft, abg. Pt wanted to hold off. Wife has copd.  CTA (03/2016) atelectasis at bases.     I personally reviewed previous images (Chest Xray, Chest Ct scan) done on this patient. I reviewed the reports on the images as well.    Thank you very much for letting me participate in this patient's care. Please do not hesitate to give me a call if you have any questions or concerns regarding the treatment plan.   Patient will follow up with me in 2-3 months.     [name], MD 05/14/2016   10:49 AM Pul[facility] [place] Pager: [phone] Office: [phone], Fax: [phone]

## 2016-05-14 NOTE — Assessment & Plan Note (Signed)
Cont Mirapex. Still have leg issues. Rx osa and observe RLS.  

## 2016-05-14 NOTE — Assessment & Plan Note (Signed)
Pt was dxed with OSA in 2000.  Unsure severity. Used cpap for < 1 month. He stopped using it 2/2 too much pressure.  He had nasal/mouth surgery in 2005 which helped with snoring. Snoring has been back x 5 yrs.   Has hypersomnia, he can sleep all day.  Has snoring, witnessed apneas, gasps, choking.  Moves his legs frequently at sleep/ Has sleep talking. No other abn behavior in sleep.   Sleeps 4-5 hrs at night.  Naps daily.  Feels unrefreshed when he wakes up in am.   Pt also has CAD. Currently on a 1 month holter.   Plan:   Patient has snoring, hypersomnia, witnessed apneas, choking, gasping during sleep.  ESS is 12.   We discussed about the diagnosis of Obstructive Sleep Apnea (OSA) and implications of untreated OSA. We discussed about CPAP and BiPaP as possible treatment options.   We will schedule the patient for a sleep study. Needs a split night study. Hopefully done next week.    Patient was instructed to call the office if he/she has not heard back from the office 1-2 weeks after the sleep study.   Patient was instructed to call the office if he/she is having issues with the PAP device.   We discussed good sleep hygiene.   Patient was advised not to engage in activities requiring concentration and/or vigilance if he/she is sleepy.  Patient was advised not to drive if he/she is sleepy.

## 2016-05-25 ENCOUNTER — Encounter (HOSPITAL_COMMUNITY)
Admission: RE | Admit: 2016-05-25 | Discharge: 2016-05-25 | Disposition: A | Discharge status: Home / Self Care | Payer: Commercial Managed Care - HMO | Source: Ambulatory Visit | Attending: Neurosurgery | Admitting: Neurosurgery

## 2016-05-25 ENCOUNTER — Encounter (HOSPITAL_COMMUNITY): Payer: Self-pay

## 2016-05-25 DIAGNOSIS — M47812 Spondylosis without myelopathy or radiculopathy, cervical region: Secondary | ICD-10-CM | POA: Diagnosis not present

## 2016-05-25 DIAGNOSIS — M4802 Spinal stenosis, cervical region: Secondary | ICD-10-CM | POA: Insufficient documentation

## 2016-05-25 DIAGNOSIS — Z7902 Long term (current) use of antithrombotics/antiplatelets: Secondary | ICD-10-CM | POA: Diagnosis not present

## 2016-05-25 DIAGNOSIS — G2581 Restless legs syndrome: Secondary | ICD-10-CM | POA: Diagnosis not present

## 2016-05-25 DIAGNOSIS — Z794 Long term (current) use of insulin: Secondary | ICD-10-CM | POA: Insufficient documentation

## 2016-05-25 DIAGNOSIS — Z955 Presence of coronary angioplasty implant and graft: Secondary | ICD-10-CM | POA: Diagnosis not present

## 2016-05-25 DIAGNOSIS — Z01812 Encounter for preprocedural laboratory examination: Secondary | ICD-10-CM | POA: Diagnosis not present

## 2016-05-25 DIAGNOSIS — E039 Hypothyroidism, unspecified: Secondary | ICD-10-CM | POA: Diagnosis not present

## 2016-05-25 DIAGNOSIS — I2510 Atherosclerotic heart disease of native coronary artery without angina pectoris: Secondary | ICD-10-CM | POA: Diagnosis not present

## 2016-05-25 DIAGNOSIS — E119 Type 2 diabetes mellitus without complications: Secondary | ICD-10-CM | POA: Insufficient documentation

## 2016-05-25 DIAGNOSIS — E785 Hyperlipidemia, unspecified: Secondary | ICD-10-CM | POA: Diagnosis not present

## 2016-05-25 DIAGNOSIS — Z01818 Encounter for other preprocedural examination: Secondary | ICD-10-CM | POA: Diagnosis not present

## 2016-05-25 DIAGNOSIS — Z79899 Other long term (current) drug therapy: Secondary | ICD-10-CM | POA: Insufficient documentation

## 2016-05-25 DIAGNOSIS — G4733 Obstructive sleep apnea (adult) (pediatric): Secondary | ICD-10-CM | POA: Insufficient documentation

## 2016-05-25 DIAGNOSIS — Z951 Presence of aortocoronary bypass graft: Secondary | ICD-10-CM | POA: Diagnosis not present

## 2016-05-25 DIAGNOSIS — K219 Gastro-esophageal reflux disease without esophagitis: Secondary | ICD-10-CM | POA: Insufficient documentation

## 2016-05-25 HISTORY — DX: Gastro-esophageal reflux disease without esophagitis: K21.9

## 2016-05-25 HISTORY — DX: Anxiety disorder, unspecified: F41.9

## 2016-05-25 HISTORY — DX: Unspecified hearing loss, unspecified ear: H91.90

## 2016-05-25 HISTORY — DX: Presence of spectacles and contact lenses: Z97.3

## 2016-05-25 HISTORY — DX: Family history of other specified conditions: Z84.89

## 2016-05-25 LAB — CBC WITH DIFFERENTIAL/PLATELET
BASOS ABS: 0 10*3/uL (ref 0.0–0.1)
Basophils Relative: 0 %
EOS PCT: 4 %
Eosinophils Absolute: 0.3 10*3/uL (ref 0.0–0.7)
HEMATOCRIT: 40.4 % (ref 39.0–52.0)
HEMOGLOBIN: 13.8 g/dL (ref 13.0–17.0)
LYMPHS ABS: 2 10*3/uL (ref 0.7–4.0)
LYMPHS PCT: 26 %
MCH: 30.1 pg (ref 26.0–34.0)
MCHC: 34.2 g/dL (ref 30.0–36.0)
MCV: 88.2 fL (ref 78.0–100.0)
Monocytes Absolute: 0.5 10*3/uL (ref 0.1–1.0)
Monocytes Relative: 6 %
NEUTROS ABS: 5.1 10*3/uL (ref 1.7–7.7)
Neutrophils Relative %: 64 %
PLATELETS: 175 10*3/uL (ref 150–400)
RBC: 4.58 MIL/uL (ref 4.22–5.81)
RDW: 13.2 % (ref 11.5–15.5)
WBC: 7.9 10*3/uL (ref 4.0–10.5)

## 2016-05-25 LAB — BASIC METABOLIC PANEL
ANION GAP: 8 (ref 5–15)
BUN: 13 mg/dL (ref 6–20)
CHLORIDE: 106 mmol/L (ref 101–111)
CO2: 25 mmol/L (ref 22–32)
Calcium: 9.3 mg/dL (ref 8.9–10.3)
Creatinine, Ser: 1.01 mg/dL (ref 0.61–1.24)
GFR calc Af Amer: >60 mL/min (ref >60)
Glucose, Bld: 105 mg/dL — ABNORMAL HIGH (ref 65–99)
POTASSIUM: 3.5 mmol/L (ref 3.5–5.1)
SODIUM: 139 mmol/L (ref 135–145)

## 2016-05-25 LAB — GLUCOSE, CAPILLARY: GLUCOSE-CAPILLARY: 99 mg/dL (ref 65–99)

## 2016-05-25 LAB — SURGICAL PCR SCREEN
MRSA, PCR: NEGATIVE
Staphylococcus aureus: POSITIVE — AB

## 2016-05-25 MED FILL — MUPIROCIN 2% OINTMENT: 2 | 5 days supply | Qty: 22 | Fill #0

## 2016-05-25 NOTE — Progress Notes (Signed)
Notified patient of positive PCR and verbalized understanding.  Prescription called into [facility].

## 2016-05-25 NOTE — Pre-Procedure Instructions (Signed)
[name] [name]  05/25/2016      [facility] [place] [address] [place] [address] Phone: [phone] Fax: [phone]    Your procedure is scheduled on Monday, June 26th, 2017.  Report to [facility] Admitting at 10:00 A.M.   Call this number if you have problems the morning of surgery:  [phone]   Remember:  Do not eat food or drink liquids after midnight.   Take these medicines the morning of surgery with A SIP OF WATER: Gabapentin (Neurontin), Isosorbide Mononitrate (Imdur), Levothyroxine (Synthroid), Ondansetron (Zofran) if needed, Pantoprazole (Protonix), Ranolazine (Ranexa), Sertraline (Zoloft), Tizanidine (Zanaflex) if needed.    Per Dr. Caryl Comes, stop Plavix 7 days prior to surgery.    WHAT DO I DO ABOUT MY DIABETES MEDICATION?  Marland Kitchen Do not take oral diabetes medicines (pills) the morning of surgery.  Do NOT take Metformin the morning of surgery.   . THE NIGHT BEFORE SURGERY, take 21 units of Toujeo insulin.      . THE MORNING OF SURGERY, take 0 units of Novolog insulin.  . If your CBG is greater than 220 mg/dL, you may take  of your sliding scale (correction) dose of insulin.  Stop taking: Diclofenac Sodium (Voltaren) Gel, Aspirin, NSAIDS, Aleve, Naproxen, Ibuprofen, Advil, Motrin, BC's, Goody's, Fish oil, all herbal medications, and all vitamins.     Do not wear jewelry.  Do not wear lotions, powders, or colognes.    Men may shave face and neck.  Do not bring valuables to the hospital.   [facility] is not responsible for any belongings or valuables.  Contacts, dentures or bridgework may not be worn into surgery.  Leave your suitcase in the car.  After surgery it may be brought to your room.  For patients admitted to the hospital, discharge time will be determined by your treatment team.  Patients discharged the day of surgery will not be allowed to drive home.    Special instructions:  See attached.   Please read over the following fact sheets that you were given. MRSA Information     How to Manage Your Diabetes Before and After Surgery  Why is it important to control my blood sugar before and after surgery? . Improving blood sugar levels before and after surgery helps healing and can limit problems. . A way of improving blood sugar control is eating a healthy diet by: o  Eating less sugar and carbohydrates o  Increasing activity/exercise o  Talking with your doctor about reaching your blood sugar goals . High blood sugars (greater than 180 mg/dL) can raise your risk of infections and slow your recovery, so you will need to focus on controlling your diabetes during the weeks before surgery. . Make sure that the doctor who takes care of your diabetes knows about your planned surgery including the date and location.  How do I manage my blood sugar before surgery? . Check your blood sugar at least 4 times a day, starting 2 days before surgery, to make sure that the level is not too high or low. o Check your blood sugar the morning of your surgery when you wake up and every 2 hours until you get to the Short Stay unit. . If your blood sugar is less than 70 mg/dL, you will need to treat for low blood sugar: o Do not take insulin. o Treat a low blood sugar (less  than 70 mg/dL) with  cup of clear juice (cranberry or apple), 4 glucose tablets, OR glucose gel. o Recheck blood sugar in 15 minutes after treatment (to make sure it is greater than 70 mg/dL). If your blood sugar is not greater than 70 mg/dL on recheck, call [phone] for further instructions. . Report your blood sugar to the short stay nurse when you get to Short Stay.  . If you are admitted to the hospital after surgery: o Your blood sugar will be checked by the staff and you will probably be given insulin after surgery (instead of oral diabetes medicines) to make sure you have good  blood sugar levels. o The goal for blood sugar control after surgery is 80-180 mg/dL.    [name] [name]- Preparing For Surgery  Before surgery, you can play an important role. Because skin is not sterile, your skin needs to be as free of germs as possible. You can reduce the number of germs on your skin by washing with CHG (chlorahexidine gluconate) Soap before surgery.  CHG is an antiseptic cleaner which kills germs and bonds with the skin to continue killing germs even after washing.  Please do not use if you have an allergy to CHG or antibacterial soaps. If your skin becomes reddened/irritated stop using the CHG.  Do not shave (including legs and underarms) for at least 48 hours prior to first CHG shower. It is OK to shave your face.  Please follow these instructions carefully.   1. Shower the NIGHT BEFORE SURGERY and the MORNING OF SURGERY with CHG.   2. If you chose to wash your hair, wash your hair first as usual with your normal shampoo.  3. After you shampoo, rinse your hair and body thoroughly to remove the shampoo.  4. Use CHG as you would any other liquid soap. You can apply CHG directly to the skin and wash gently with a scrungie or a clean washcloth.   5. Apply the CHG Soap to your body ONLY FROM THE NECK DOWN.  Do not use on open wounds or open sores. Avoid contact with your eyes, ears, mouth and genitals (private parts). Wash genitals (private parts) with your normal soap.  6. Wash thoroughly, paying special attention to the area where your surgery will be performed.  7. Thoroughly rinse your body with warm water from the neck down.  8. DO NOT shower/wash with your normal soap after using and rinsing off the CHG Soap.  9. Pat yourself dry with a CLEAN TOWEL.   10. Wear CLEAN PAJAMAS   11. Place CLEAN SHEETS on your bed the night of your first shower and DO NOT SLEEP WITH PETS.  Day of Surgery: Do not apply any deodorants/lotions. Please wear clean clothes to the  hospital/surgery center.

## 2016-05-25 NOTE — Progress Notes (Signed)
PCP - Dr. Penni Homans Cardiologist - [name]  EKG - 03/30/16 CXR - 09/2015 Echo - 03/2016 Stress test - 2006 Cath - 2016  Patient denies chest pain and shortness of breath at PAT appointment.  Cardiac clearance in chart with note that Plavix can be held 7 days prior to surgery.  Patient took his last dose of Plavix on 05/25/16.  Patient states that he checks his blood sugar 2-3 times a day and that he thinks his fasting glucose is in the low 100's but it varies on what he ate the night before.

## 2016-05-26 LAB — HEMOGLOBIN A1C
Hgb A1c MFr Bld: 9 % — ABNORMAL HIGH (ref 4.8–5.6)
MEAN PLASMA GLUCOSE: 212 mg/dL

## 2016-05-26 NOTE — Progress Notes (Signed)
Anesthesia Chart Review: Patient is a 70 year old male scheduled for C5-6, C6-7 ACDF on 05/31/16 by Dr. Annette Stable.  History includes non-smoker, CAD s/p CABG '00 and DES RCA '09, palpitations, chronic diastolic CHF, edema, syncope 03/2016, falls with multiple concussions (chornic microvascular ischemia/small vessel disease by MRI), DM2, HLD, hypothyroidism, OSA (no CPAP), RLS, depression, nephrolithiasis, headaches, iron deficiency anemia, GERD, hard of hearing, nasal septum surgery. Earlier this year he was hospitalized for syncope 03/2016 and found to be orthostatic. MRI brain, CTA head/neck, echo, EEG were unremarkable. 48 hour holter showed now significant arrhythmias or pauses. He is currently wearing a 30 day monitor. Some medication changes were recommended, and he was referred to EP cardiology, question of autonomic dysfunction.   - PCP is Dr. Penni Homans, last visit 04/26/16. - Neurologist is Dr. Metta Clines, last visit 03/23/16. - Pulmonologist is Dr. Rush Landmark, last visit 05/14/16. New sleep study ordered. - Primary cardiologist is [name] (previously Dr. [name]), last visit with Richardson Dopp, PA-C on 03/30/16. - EP cardiologist is Dr. Caryl Comes, last visit 05/10/16. His note states patient "has a functional status of the probably about 4 METS." He denied chest pain. Dr. Caryl Comes signed a note of cardiac clearance with permission to hold Plavix seven days prior to surgery.   Meds include Lipitor, baclofen, Plavix (last dose 06/04/16), Neurontin, Novolog, Toufeo, Imdur, levothyroxine, metformin, Nitro, Zofran, Protonix, Mirapex, Ranexa, Zoloft, Zanaflex, Topamax.   PAT Vitals: BP 119/64, HR 69, RR 20, T 36.1C, O2 sat 99%. CBG 99.  03/30/16 EKG ([facility]): NSR, right BBB (old).   03/10/16 Echo: Study Conclusions - Left ventricle: The cavity size was normal. There was mild  concentric hypertrophy. Systolic function was normal. The  estimated ejection fraction was in the range of 55% to 60%.  Wall  motion was normal; there were no regional wall motion  abnormalities. There was an increased relative contribution of  atrial contraction to ventricular filling. Doppler parameters are  consistent with abnormal left ventricular relaxation (grade 1  diastolic dysfunction). - Mitral valve: There was mild regurgitation. - Left atrium: The atrium was mildly dilated.  03/2016 48 hour Holter Monitor: 1. Normal sinus rhythm, occasional PVCs. (HR 55-106) 2. No pathologic arrhythmias.  05/30/15 Cardiac cath (Dr. Glenetta Hew): 1. Prox LAD to Mid LAD lesion, 90% stenosed. Mid LAD to Dist LAD lesion, 100% stenosed within a bare metal stent (from Pre-CABG). 2. Ost 2nd Diag to 2nd Diag lesion, 100% stenosed within a BMS (pre-CABG) 3. Post Atrio lesion, 100% stenosed. 4. Widely patent DES stent to the mid RCA just prior to the bifurcation into RPDA and RPAV. 5. Widely patent LIMA to a Small downstream distal LAD - minimal CAD 6. Widely patent saphenous vein grafts to the D1, D2 and RPAV 7. The left ventricular systolic function is normal. 8. No new lesion to explain unstable angina Conclusions: Severe native disease of the LAD (occluded D1, as well as occluded stents in D2 and LAD.) as well as occluded RPAV. Patent RCA stents & Grafts x 4. Relatively stable findings of angiography. There was sluggish flow down the grafts to the diagonal branches secondary to hypotension following radial cocktail administered. Recommendations: Increase medical management. In the future, I think we could consider noninvasive stress testing as he is unlikely to have a false negative stress test given his current anatomy.   05/30/15 Carotid U/S: Summary: Bilateral: 1-39% ICA stenosis. Vertebral artery flow is antegrade. Duplex imaging of the brachial artery demonstrates triphasic  Waveforms.  04/05/16-04/08/16 EEG: Impression: This is a normal 72 hour ambulatory EEG , with activating procedures.   03/10/16 CTA  head/neck: IMPRESSION: 1. Atherosclerotic changes with predominantly noncalcified plaque at the aortic arch, the distal left common carotid artery, both carotid bifurcations, and the cavernous internal carotid arteries. There are no significant focal stenoses. 2. Mild distal small vessel intracranial atherosclerotic changes in both the anterior and posterior circulation. 3. No significant proximal stenosis, aneurysm, or branch vessel occlusion within the circle of Willis. 4. Endplate degenerative changes and uncovertebral spurring at C3-4 and C6-7.  05/01/16 MRI C-spine: IMPRESSION: 1. Cervical spondylosis and degenerative disc disease, causing moderate impingement at C3-4, C4-5, C5-6, and C6-7, as detailed above. Overall the degree of impingement is similar to the prior exam from 07/23/2015.  03/13/16 1V CXR: IMPRESSION: Evidence of early edema, less likely atypical infection. Surgical changes of median sternotomy and CABG.  03/22/16 CTA chest: IMPRESSION: Negative for pulmonary embolus or acute disease. No finding to explain the patient's symptoms. Status post CABG.  Preoperative labs noted. Cr 1.01. Glucose 105. CBC WNL. A1c 9.0, consistent with mean plasma glucose of 212. He reported typical fasting glucose in the "low 100's." A1c of 9.0 reported to [facility] at Dr. Marchelle Folks office.   Patient with multiple testing as outlined above. He is being considered for dysautonomia, but I don't think he carries an official diagnosis at this point. 30 days event monitor is till pending, but he has had recent cardiology follow-up and was cleared for this procedure. Further evaluation on the day of surgery to ensure no acute changes, but if stable and CBG acceptable then I would anticipate that he could proceed as planned.   George Hugh [facility] Short Stay Center/Anesthesiology Phone [phone] 05/26/2016 12:22 PM

## 2016-05-27 ENCOUNTER — Other Ambulatory Visit: Payer: Self-pay | Admitting: Family Medicine

## 2016-05-27 ENCOUNTER — Other Ambulatory Visit: Payer: Self-pay | Admitting: Physician Assistant

## 2016-05-27 MED FILL — MIDODRINE HCL 10 MG TABLET: 10 | 30 days supply | Qty: 45 | Fill #0

## 2016-05-27 MED FILL — metFORMIN HCL 1000 MG TABS: 1000 | 30 days supply | Qty: 60 | Fill #0

## 2016-05-27 MED FILL — PANTOPRAZOLE SOD DR 40 MG T: 40 | 30 days supply | Qty: 45 | Fill #5

## 2016-05-27 MED FILL — PRAMIPEXOLE 1 MG TABLET: 1 | 30 days supply | Qty: 30 | Fill #1

## 2016-05-27 MED FILL — ISOSORBIDE MN ER 60 MG TAB: 60 | 90 days supply | Qty: 180 | Fill #0

## 2016-05-27 NOTE — Telephone Encounter (Signed)
Will defer further refills of patient's medications to PCP  

## 2016-05-31 ENCOUNTER — Encounter (HOSPITAL_COMMUNITY): Payer: Self-pay | Admitting: *Deleted

## 2016-05-31 ENCOUNTER — Encounter (HOSPITAL_COMMUNITY)
Admission: RE | Disposition: A | Discharge status: Home / Self Care | Payer: Self-pay | Source: Ambulatory Visit | Attending: Neurosurgery

## 2016-05-31 ENCOUNTER — Inpatient Hospital Stay (HOSPITAL_COMMUNITY): Payer: Commercial Managed Care - HMO | Admitting: Certified Registered Nurse Anesthetist

## 2016-05-31 ENCOUNTER — Inpatient Hospital Stay (HOSPITAL_COMMUNITY): Payer: Commercial Managed Care - HMO

## 2016-05-31 ENCOUNTER — Inpatient Hospital Stay (HOSPITAL_COMMUNITY)
Admission: RE | Admit: 2016-05-31 | Discharge: 2016-06-01 | DRG: 472 | Disposition: A | Discharge status: Home / Self Care | Payer: Commercial Managed Care - HMO | Source: Ambulatory Visit | Attending: Neurosurgery | Admitting: Neurosurgery

## 2016-05-31 ENCOUNTER — Inpatient Hospital Stay (HOSPITAL_COMMUNITY): Payer: Commercial Managed Care - HMO | Admitting: Vascular Surgery

## 2016-05-31 DIAGNOSIS — F419 Anxiety disorder, unspecified: Secondary | ICD-10-CM | POA: Diagnosis present

## 2016-05-31 DIAGNOSIS — K219 Gastro-esophageal reflux disease without esophagitis: Secondary | ICD-10-CM | POA: Diagnosis present

## 2016-05-31 DIAGNOSIS — Z91013 Allergy to seafood: Secondary | ICD-10-CM | POA: Diagnosis not present

## 2016-05-31 DIAGNOSIS — F319 Bipolar disorder, unspecified: Secondary | ICD-10-CM | POA: Diagnosis present

## 2016-05-31 DIAGNOSIS — G4733 Obstructive sleep apnea (adult) (pediatric): Secondary | ICD-10-CM | POA: Diagnosis present

## 2016-05-31 DIAGNOSIS — Z885 Allergy status to narcotic agent status: Secondary | ICD-10-CM

## 2016-05-31 DIAGNOSIS — Z833 Family history of diabetes mellitus: Secondary | ICD-10-CM

## 2016-05-31 DIAGNOSIS — I4510 Unspecified right bundle-branch block: Secondary | ICD-10-CM | POA: Diagnosis present

## 2016-05-31 DIAGNOSIS — Z8249 Family history of ischemic heart disease and other diseases of the circulatory system: Secondary | ICD-10-CM | POA: Diagnosis not present

## 2016-05-31 DIAGNOSIS — I110 Hypertensive heart disease with heart failure: Secondary | ICD-10-CM | POA: Diagnosis present

## 2016-05-31 DIAGNOSIS — E785 Hyperlipidemia, unspecified: Secondary | ICD-10-CM | POA: Diagnosis present

## 2016-05-31 DIAGNOSIS — Z9114 Patient's other noncompliance with medication regimen: Secondary | ICD-10-CM | POA: Diagnosis not present

## 2016-05-31 DIAGNOSIS — Z951 Presence of aortocoronary bypass graft: Secondary | ICD-10-CM

## 2016-05-31 DIAGNOSIS — G2581 Restless legs syndrome: Secondary | ICD-10-CM | POA: Diagnosis present

## 2016-05-31 DIAGNOSIS — Z7902 Long term (current) use of antithrombotics/antiplatelets: Secondary | ICD-10-CM | POA: Diagnosis not present

## 2016-05-31 DIAGNOSIS — I5032 Chronic diastolic (congestive) heart failure: Secondary | ICD-10-CM | POA: Diagnosis present

## 2016-05-31 DIAGNOSIS — H9190 Unspecified hearing loss, unspecified ear: Secondary | ICD-10-CM | POA: Diagnosis present

## 2016-05-31 DIAGNOSIS — E1151 Type 2 diabetes mellitus with diabetic peripheral angiopathy without gangrene: Secondary | ICD-10-CM | POA: Diagnosis present

## 2016-05-31 DIAGNOSIS — Z79899 Other long term (current) drug therapy: Secondary | ICD-10-CM | POA: Diagnosis not present

## 2016-05-31 DIAGNOSIS — R296 Repeated falls: Secondary | ICD-10-CM | POA: Diagnosis present

## 2016-05-31 DIAGNOSIS — Z955 Presence of coronary angioplasty implant and graft: Secondary | ICD-10-CM | POA: Diagnosis not present

## 2016-05-31 DIAGNOSIS — Z794 Long term (current) use of insulin: Secondary | ICD-10-CM

## 2016-05-31 DIAGNOSIS — Z419 Encounter for procedure for purposes other than remedying health state, unspecified: Secondary | ICD-10-CM

## 2016-05-31 DIAGNOSIS — M4802 Spinal stenosis, cervical region: Secondary | ICD-10-CM | POA: Diagnosis present

## 2016-05-31 DIAGNOSIS — M542 Cervicalgia: Secondary | ICD-10-CM | POA: Diagnosis present

## 2016-05-31 DIAGNOSIS — I2510 Atherosclerotic heart disease of native coronary artery without angina pectoris: Secondary | ICD-10-CM | POA: Diagnosis present

## 2016-05-31 HISTORY — PX: ANTERIOR CERVICAL DECOMP/DISCECTOMY FUSION: SHX1161

## 2016-05-31 LAB — GLUCOSE, CAPILLARY
GLUCOSE-CAPILLARY: 235 mg/dL — AB (ref 65–99)
GLUCOSE-CAPILLARY: 285 mg/dL — AB (ref 65–99)
Glucose-Capillary: 364 mg/dL — ABNORMAL HIGH (ref 65–99)

## 2016-05-31 SURGERY — ANTERIOR CERVICAL DECOMPRESSION/DISCECTOMY FUSION 2 LEVELS
Anesthesia: General

## 2016-05-31 MED ORDER — PANTOPRAZOLE SODIUM 40 MG PO TBEC
40.0000 mg | DELAYED_RELEASE_TABLET | Freq: Every day | ORAL | Status: DC
  Filled 2016-05-31: qty 1

## 2016-05-31 MED ORDER — HYDROCODONE-ACETAMINOPHEN 5-325 MG PO TABS: 1.0000 | ORAL_TABLET | ORAL | Status: DC | PRN

## 2016-05-31 MED ORDER — LEVOTHYROXINE SODIUM 75 MCG PO TABS
75.0000 ug | ORAL_TABLET | Freq: Every day | ORAL | Status: DC
  Filled 2016-05-31: qty 1

## 2016-05-31 MED ORDER — OXYCODONE-ACETAMINOPHEN 5-325 MG PO TABS
1.0000 | ORAL_TABLET | ORAL | Status: DC | PRN
  Administered 2016-05-31 – 2016-06-01 (×2): 2 via ORAL
  Filled 2016-05-31 (×2): qty 2

## 2016-05-31 MED ORDER — HYDROMORPHONE HCL 1 MG/ML IJ SOLN
0.2500 mg | INTRAMUSCULAR | Status: DC | PRN
  Administered 2016-05-31 (×2): 0.5 mg via INTRAVENOUS

## 2016-05-31 MED ORDER — FENTANYL CITRATE (PF) 250 MCG/5ML IJ SOLN
INTRAMUSCULAR | Status: AC
  Filled 2016-05-31: qty 5

## 2016-05-31 MED ORDER — ISOSORBIDE MONONITRATE ER 60 MG PO TB24
120.0000 mg | ORAL_TABLET | Freq: Every day | ORAL | Status: DC
  Administered 2016-05-31: 120 mg via ORAL
  Filled 2016-05-31 (×2): qty 2

## 2016-05-31 MED ORDER — CEFAZOLIN SODIUM-DEXTROSE 2-4 GM/100ML-% IV SOLN
INTRAVENOUS | Status: AC
  Filled 2016-05-31: qty 100

## 2016-05-31 MED ORDER — SUGAMMADEX SODIUM 200 MG/2ML IV SOLN
INTRAVENOUS | Status: AC
Start: 2016-05-31 — End: 2016-05-31
  Filled 2016-05-31: qty 2

## 2016-05-31 MED ORDER — INSULIN ASPART 100 UNIT/ML ~~LOC~~ SOLN: 10.0000 [IU] | Freq: Three times a day (TID) | SUBCUTANEOUS | Status: DC

## 2016-05-31 MED ORDER — THROMBIN 5000 UNITS EX SOLR
CUTANEOUS | Status: DC | PRN
  Administered 2016-05-31 (×2): 5000 [IU] via TOPICAL

## 2016-05-31 MED ORDER — RANOLAZINE ER 500 MG PO TB12
500.0000 mg | ORAL_TABLET | Freq: Two times a day (BID) | ORAL | Status: DC
  Administered 2016-05-31: 500 mg via ORAL
  Filled 2016-05-31 (×2): qty 1

## 2016-05-31 MED ORDER — LACTATED RINGERS IV SOLN
INTRAVENOUS | Status: DC | PRN
  Administered 2016-05-31: 13:00:00 via INTRAVENOUS

## 2016-05-31 MED ORDER — SUGAMMADEX SODIUM 200 MG/2ML IV SOLN
INTRAVENOUS | Status: DC | PRN
  Administered 2016-05-31: 200 mg via INTRAVENOUS

## 2016-05-31 MED ORDER — ISOSORBIDE MONONITRATE ER 60 MG PO TB24: 120.0000 mg | ORAL_TABLET | Freq: Every day | ORAL | Status: DC

## 2016-05-31 MED ORDER — INSULIN GLARGINE 100 UNIT/ML ~~LOC~~ SOLN
42.0000 [IU] | Freq: Every day | SUBCUTANEOUS | Status: DC
  Administered 2016-05-31: 42 [IU] via SUBCUTANEOUS
  Filled 2016-05-31 (×2): qty 0.42

## 2016-05-31 MED ORDER — PROMETHAZINE HCL 25 MG/ML IJ SOLN: 6.2500 mg | INTRAMUSCULAR | Status: DC | PRN

## 2016-05-31 MED ORDER — CEFAZOLIN SODIUM-DEXTROSE 2-4 GM/100ML-% IV SOLN
2.0000 g | INTRAVENOUS | Status: AC
  Administered 2016-05-31: 2 g via INTRAVENOUS

## 2016-05-31 MED ORDER — FENTANYL CITRATE (PF) 250 MCG/5ML IJ SOLN
INTRAMUSCULAR | Status: DC | PRN
  Administered 2016-05-31 (×4): 50 ug via INTRAVENOUS
  Administered 2016-05-31: 150 ug via INTRAVENOUS
  Administered 2016-05-31: 50 ug via INTRAVENOUS

## 2016-05-31 MED ORDER — INSULIN ASPART 100 UNIT/ML ~~LOC~~ SOLN
0.0000 [IU] | Freq: Every day | SUBCUTANEOUS | Status: DC
  Administered 2016-05-31: 5 [IU] via SUBCUTANEOUS

## 2016-05-31 MED ORDER — MIDAZOLAM HCL 2 MG/2ML IJ SOLN
INTRAMUSCULAR | Status: AC
  Filled 2016-05-31: qty 2

## 2016-05-31 MED ORDER — EPHEDRINE SULFATE 50 MG/ML IJ SOLN
INTRAMUSCULAR | Status: DC | PRN
  Administered 2016-05-31: 15 mg via INTRAVENOUS

## 2016-05-31 MED ORDER — PRAMIPEXOLE DIHYDROCHLORIDE 1 MG PO TABS
1.0000 mg | ORAL_TABLET | Freq: Every day | ORAL | Status: DC
  Administered 2016-05-31: 1 mg via ORAL
  Filled 2016-05-31: qty 1

## 2016-05-31 MED ORDER — CYCLOBENZAPRINE HCL 10 MG PO TABS
10.0000 mg | ORAL_TABLET | Freq: Three times a day (TID) | ORAL | Status: DC | PRN
  Administered 2016-05-31: 10 mg via ORAL
  Filled 2016-05-31: qty 1

## 2016-05-31 MED ORDER — MIDAZOLAM HCL 5 MG/5ML IJ SOLN
INTRAMUSCULAR | Status: DC | PRN
  Administered 2016-05-31: 2 mg via INTRAVENOUS

## 2016-05-31 MED ORDER — SODIUM CHLORIDE 0.9% FLUSH: 3.0000 mL | Freq: Two times a day (BID) | INTRAVENOUS | Status: DC

## 2016-05-31 MED ORDER — MENTHOL 3 MG MT LOZG: 1.0000 | LOZENGE | OROMUCOSAL | Status: DC | PRN

## 2016-05-31 MED ORDER — NITROGLYCERIN 0.4 MG SL SUBL: 0.4000 mg | SUBLINGUAL_TABLET | SUBLINGUAL | Status: DC | PRN

## 2016-05-31 MED ORDER — DEXAMETHASONE SODIUM PHOSPHATE 10 MG/ML IJ SOLN
10.0000 mg | INTRAMUSCULAR | Status: AC
  Administered 2016-05-31: 10 mg via INTRAVENOUS

## 2016-05-31 MED ORDER — LIDOCAINE 2% (20 MG/ML) 5 ML SYRINGE
INTRAMUSCULAR | Status: DC | PRN
  Administered 2016-05-31: 40 mg via INTRAVENOUS
  Administered 2016-05-31: 10 mg via INTRAVENOUS

## 2016-05-31 MED ORDER — SERTRALINE HCL 50 MG PO TABS
100.0000 mg | ORAL_TABLET | Freq: Two times a day (BID) | ORAL | Status: DC
  Administered 2016-05-31: 100 mg via ORAL
  Filled 2016-05-31: qty 2

## 2016-05-31 MED ORDER — HYDROMORPHONE HCL 1 MG/ML IJ SOLN
INTRAMUSCULAR | Status: AC
  Filled 2016-05-31: qty 1

## 2016-05-31 MED ORDER — LACTATED RINGERS IV SOLN
INTRAVENOUS | Status: DC
  Administered 2016-05-31: 10:00:00 via INTRAVENOUS

## 2016-05-31 MED ORDER — GABAPENTIN 300 MG PO CAPS
600.0000 mg | ORAL_CAPSULE | Freq: Three times a day (TID) | ORAL | Status: DC
  Administered 2016-05-31: 600 mg via ORAL
  Filled 2016-05-31: qty 2

## 2016-05-31 MED ORDER — PHENOL 1.4 % MT LIQD
1.0000 | OROMUCOSAL | Status: DC | PRN
Start: 2016-05-31 — End: 2016-06-01
  Filled 2016-05-31: qty 177

## 2016-05-31 MED ORDER — ONDANSETRON HCL 4 MG/2ML IJ SOLN
INTRAMUSCULAR | Status: DC | PRN
  Administered 2016-05-31: 4 mg via INTRAVENOUS

## 2016-05-31 MED ORDER — ONDANSETRON 4 MG PO TBDP
4.0000 mg | ORAL_TABLET | Freq: Three times a day (TID) | ORAL | Status: DC | PRN
  Filled 2016-05-31: qty 1

## 2016-05-31 MED ORDER — INSULIN ASPART 100 UNIT/ML ~~LOC~~ SOLN: 0.0000 [IU] | Freq: Three times a day (TID) | SUBCUTANEOUS | Status: DC

## 2016-05-31 MED ORDER — TOPIRAMATE 25 MG PO TABS
50.0000 mg | ORAL_TABLET | Freq: Every day | ORAL | Status: DC
  Administered 2016-05-31: 50 mg via ORAL
  Filled 2016-05-31: qty 2

## 2016-05-31 MED ORDER — DEXAMETHASONE SODIUM PHOSPHATE 10 MG/ML IJ SOLN
INTRAMUSCULAR | Status: AC
  Filled 2016-05-31: qty 1

## 2016-05-31 MED ORDER — PROPOFOL 10 MG/ML IV BOLUS
INTRAVENOUS | Status: DC | PRN
  Administered 2016-05-31: 130 mg via INTRAVENOUS

## 2016-05-31 MED ORDER — HYDROMORPHONE HCL 1 MG/ML IJ SOLN: 0.5000 mg | INTRAMUSCULAR | Status: DC | PRN

## 2016-05-31 MED ORDER — INSULIN GLARGINE 300 UNIT/ML ~~LOC~~ SOPN: 42.0000 [IU] | PEN_INJECTOR | Freq: Every day | SUBCUTANEOUS | Status: DC

## 2016-05-31 MED ORDER — TIZANIDINE HCL 4 MG PO TABS
4.0000 mg | ORAL_TABLET | Freq: Three times a day (TID) | ORAL | Status: DC | PRN
  Administered 2016-06-01: 4 mg via ORAL
  Filled 2016-05-31: qty 1

## 2016-05-31 MED ORDER — THROMBIN 5000 UNITS EX SOLR
OROMUCOSAL | Status: DC | PRN
  Administered 2016-05-31: 14:00:00 via TOPICAL

## 2016-05-31 MED ORDER — BACLOFEN 5 MG HALF TABLET
5.0000 mg | ORAL_TABLET | Freq: Every day | ORAL | Status: DC
  Administered 2016-05-31: 5 mg via ORAL
  Filled 2016-05-31: qty 1

## 2016-05-31 MED ORDER — PHENYLEPHRINE HCL 10 MG/ML IJ SOLN
10.0000 mg | INTRAVENOUS | Status: DC | PRN
  Administered 2016-05-31: 20 ug/min via INTRAVENOUS

## 2016-05-31 MED ORDER — GLYCOPYRROLATE 0.2 MG/ML IJ SOLN
INTRAMUSCULAR | Status: DC | PRN
Start: 2016-05-31 — End: 2016-05-31
  Administered 2016-05-31: 0.1 mg via INTRAVENOUS

## 2016-05-31 MED ORDER — 0.9 % SODIUM CHLORIDE (POUR BTL) OPTIME
TOPICAL | Status: DC | PRN
  Administered 2016-05-31: 1000 mL

## 2016-05-31 MED ORDER — ONDANSETRON HCL 4 MG/2ML IJ SOLN: 4.0000 mg | INTRAMUSCULAR | Status: DC | PRN

## 2016-05-31 MED ORDER — PHENYLEPHRINE HCL 10 MG/ML IJ SOLN: 10.0000 mg | INTRAVENOUS | Status: DC | PRN

## 2016-05-31 MED ORDER — SODIUM CHLORIDE 0.9 % IR SOLN
Status: DC | PRN
  Administered 2016-05-31: 13:00:00

## 2016-05-31 MED ORDER — ACETAMINOPHEN 325 MG PO TABS: 650.0000 mg | ORAL_TABLET | ORAL | Status: DC | PRN

## 2016-05-31 MED ORDER — ROCURONIUM BROMIDE 100 MG/10ML IV SOLN
INTRAVENOUS | Status: DC | PRN
  Administered 2016-05-31: 40 mg via INTRAVENOUS

## 2016-05-31 MED ORDER — SODIUM CHLORIDE 0.9% FLUSH: 3.0000 mL | INTRAVENOUS | Status: DC | PRN

## 2016-05-31 MED ORDER — ACETAMINOPHEN 650 MG RE SUPP: 650.0000 mg | RECTAL | Status: DC | PRN

## 2016-05-31 MED ORDER — ATORVASTATIN CALCIUM 20 MG PO TABS
20.0000 mg | ORAL_TABLET | Freq: Every day | ORAL | Status: DC
  Administered 2016-05-31: 20 mg via ORAL
  Filled 2016-05-31: qty 1

## 2016-05-31 MED ORDER — HEMOSTATIC AGENTS (NO CHARGE) OPTIME
TOPICAL | Status: DC | PRN
  Administered 2016-05-31: 1 via TOPICAL

## 2016-05-31 MED ORDER — PHENYLEPHRINE HCL 10 MG/ML IJ SOLN
INTRAMUSCULAR | Status: DC | PRN
  Administered 2016-05-31: 40 ug via INTRAVENOUS
  Administered 2016-05-31: 80 ug via INTRAVENOUS

## 2016-05-31 MED ORDER — METFORMIN HCL 500 MG PO TABS
1000.0000 mg | ORAL_TABLET | Freq: Two times a day (BID) | ORAL | Status: DC
  Administered 2016-05-31: 1000 mg via ORAL
  Filled 2016-05-31: qty 2

## 2016-05-31 MED ORDER — CEFAZOLIN IN D5W 1 GM/50ML IV SOLN
1.0000 g | Freq: Three times a day (TID) | INTRAVENOUS | Status: AC
  Administered 2016-05-31 – 2016-06-01 (×2): 1 g via INTRAVENOUS
  Filled 2016-05-31 (×2): qty 50

## 2016-05-31 SURGICAL SUPPLY — 61 items
APL SKNCLS STERI-STRIP NONHPOA (GAUZE/BANDAGES/DRESSINGS) ×1
BAG DECANTER FOR FLEXI CONT (MISCELLANEOUS) ×2 IMPLANT
BENZOIN TINCTURE PRP APPL 2/3 (GAUZE/BANDAGES/DRESSINGS) ×2 IMPLANT
BIT DRILL 13 (BIT) ×1 IMPLANT
BRUSH SCRUB EZ PLAIN DRY (MISCELLANEOUS) ×2 IMPLANT
BUR MATCHSTICK NEURO 3.0 LAGG (BURR) ×2 IMPLANT
CAGE PEEK 7X14X11 (Cage) ×2 IMPLANT
CANISTER SUCT 3000ML PPV (MISCELLANEOUS) ×2 IMPLANT
DRAPE C-ARM 42X72 X-RAY (DRAPES) ×4 IMPLANT
DRAPE LAPAROTOMY 100X72 PEDS (DRAPES) ×2 IMPLANT
DRAPE MICROSCOPE LEICA (MISCELLANEOUS) ×2 IMPLANT
DRAPE POUCH INSTRU U-SHP 10X18 (DRAPES) ×2 IMPLANT
DURAPREP 6ML APPLICATOR 50/CS (WOUND CARE) ×2 IMPLANT
ELECT COATED BLADE 2.86 ST (ELECTRODE) ×2 IMPLANT
ELECT REM PT RETURN 9FT ADLT (ELECTROSURGICAL) ×2
ELECTRODE REM PT RTRN 9FT ADLT (ELECTROSURGICAL) ×1 IMPLANT
GAUZE SPONGE 4X4 12PLY STRL (GAUZE/BANDAGES/DRESSINGS) ×2 IMPLANT
GAUZE SPONGE 4X4 16PLY XRAY LF (GAUZE/BANDAGES/DRESSINGS) IMPLANT
GLOVE BIO SURGEON STRL SZ 6 (GLOVE) ×1 IMPLANT
GLOVE ECLIPSE 7.5 STRL STRAW (GLOVE) ×1 IMPLANT
GLOVE ECLIPSE 9.0 STRL (GLOVE) ×2 IMPLANT
GLOVE EXAM NITRILE LRG STRL (GLOVE) IMPLANT
GLOVE EXAM NITRILE MD LF STRL (GLOVE) IMPLANT
GLOVE EXAM NITRILE XL STR (GLOVE) IMPLANT
GLOVE EXAM NITRILE XS STR PU (GLOVE) IMPLANT
GLOVE INDICATOR 6.0 STRL GRN (GLOVE) ×1 IMPLANT
GLOVE INDICATOR 7.5 STRL GRN (GLOVE) ×1 IMPLANT
GLOVE INDICATOR 8.0 STRL GRN (GLOVE) ×1 IMPLANT
GOWN STRL REUS W/ TWL LRG LVL3 (GOWN DISPOSABLE) IMPLANT
GOWN STRL REUS W/ TWL XL LVL3 (GOWN DISPOSABLE) ×1 IMPLANT
GOWN STRL REUS W/TWL 2XL LVL3 (GOWN DISPOSABLE) IMPLANT
GOWN STRL REUS W/TWL LRG LVL3 (GOWN DISPOSABLE) ×2
GOWN STRL REUS W/TWL XL LVL3 (GOWN DISPOSABLE) ×4
HALTER HD/CHIN CERV TRACTION D (MISCELLANEOUS) ×2 IMPLANT
HEMOSTAT POWDER SURGIFOAM 1G (HEMOSTASIS) ×1 IMPLANT
HEMOSTAT SURGICEL 2X14 (HEMOSTASIS) IMPLANT
KIT BASIN OR (CUSTOM PROCEDURE TRAY) ×2 IMPLANT
KIT ROOM TURNOVER OR (KITS) ×2 IMPLANT
NDL SPNL 20GX3.5 QUINCKE YW (NEEDLE) ×1 IMPLANT
NEEDLE SPNL 20GX3.5 QUINCKE YW (NEEDLE) ×2 IMPLANT
NS IRRIG 1000ML POUR BTL (IV SOLUTION) ×2 IMPLANT
PACK LAMINECTOMY NEURO (CUSTOM PROCEDURE TRAY) ×2 IMPLANT
PAD ARMBOARD 7.5X6 YLW CONV (MISCELLANEOUS) ×6 IMPLANT
PEEK CAGE 8X14X11 (Peek) ×1 IMPLANT
PLATE 45MM (Plate) ×2 IMPLANT
PLATE 45XATL VS ELT (Plate) IMPLANT
RUBBERBAND STERILE (MISCELLANEOUS) ×4 IMPLANT
SCREW ST 13X4XST VA NS SPNE (Screw) IMPLANT
SCREW ST VAR 4 ATL (Screw) ×12 IMPLANT
SPACER SPNL 11X14X7XPEEK CVD (Cage) IMPLANT
SPCR SPNL 11X14X7XPEEK CVD (Cage) ×1 IMPLANT
SPONGE INTESTINAL PEANUT (DISPOSABLE) ×2 IMPLANT
SPONGE SURGIFOAM ABS GEL SZ50 (HEMOSTASIS) ×2 IMPLANT
STRIP CLOSURE SKIN 1/2X4 (GAUZE/BANDAGES/DRESSINGS) ×2 IMPLANT
SUT VIC AB 3-0 SH 8-18 (SUTURE) ×2 IMPLANT
SUT VIC AB 4-0 RB1 18 (SUTURE) ×2 IMPLANT
TAPE CLOTH 4X10 WHT NS (GAUZE/BANDAGES/DRESSINGS) ×1 IMPLANT
TOWEL OR 17X24 6PK STRL BLUE (TOWEL DISPOSABLE) ×2 IMPLANT
TOWEL OR 17X26 10 PK STRL BLUE (TOWEL DISPOSABLE) ×2 IMPLANT
TRAP SPECIMEN MUCOUS 40CC (MISCELLANEOUS) ×2 IMPLANT
WATER STERILE IRR 1000ML POUR (IV SOLUTION) ×2 IMPLANT

## 2016-05-31 NOTE — Anesthesia Preprocedure Evaluation (Addendum)
Anesthesia Evaluation  Patient identified by MRN, date of birth, ID band Patient awake    Reviewed: Allergy & Precautions, NPO status , Patient's Chart, lab work & pertinent test results  Airway Mallampati: II  TM Distance: >3 FB Neck ROM: Limited    Dental  (+) Dental Advisory Given   Pulmonary sleep apnea ,    breath sounds clear to auscultation       Cardiovascular hypertension, Pt. on medications + CAD, + Cardiac Stents, + CABG, + Peripheral Vascular Disease and +CHF  + dysrhythmias Atrial Fibrillation  Rhythm:Regular Rate:Normal  03/10/16 Echo: Study Conclusions - Left ventricle: The cavity size was normal. There was mild concentric hypertrophy. Systolic function was normal. The estimated ejection fraction was in the range of 55% to 60%. Wall motion was normal; there were no regional wall motion abnormalities. There was an increased relative contribution of atrial contraction to ventricular filling. Doppler parameters are  consistent with abnormal left ventricular relaxation (grade 1  diastolic dysfunction). - Mitral valve: There was mild regurgitation. - Left atrium: The atrium was mildly dilated.  05/2015 Cath: Patent grafts and stents.   Neuro/Psych  Headaches, Anxiety Depression    GI/Hepatic Neg liver ROS, PUD, GERD  ,  Endo/Other  diabetes, Type 2, Insulin DependentHypothyroidism   Renal/GU negative Renal ROS     Musculoskeletal  (+) Arthritis ,   Abdominal   Peds  Hematology negative hematology ROS (+)   Anesthesia Other Findings   Reproductive/Obstetrics                            Lab Results  Component Value Date   WBC 7.9 05/25/2016   HGB 13.8 05/25/2016   HCT 40.4 05/25/2016   MCV 88.2 05/25/2016   PLT 175 05/25/2016   Lab Results  Component Value Date   CREATININE 1.01 05/25/2016   BUN 13 05/25/2016   NA 139 05/25/2016   K 3.5 05/25/2016   CL 106 05/25/2016   CO2  25 05/25/2016    Anesthesia Physical Anesthesia Plan  ASA: III  Anesthesia Plan: General   Post-op Pain Management:    Induction: Intravenous  Airway Management Planned: Oral ETT  Additional Equipment:   Intra-op Plan:   Post-operative Plan: Extubation in OR  Informed Consent: I have reviewed the patients History and Physical, chart, labs and discussed the procedure including the risks, benefits and alternatives for the proposed anesthesia with the patient or authorized representative who has indicated his/her understanding and acceptance.   Dental advisory given  Plan Discussed with: CRNA  Anesthesia Plan Comments:         Anesthesia Quick Evaluation

## 2016-05-31 NOTE — Brief Op Note (Signed)
05/31/2016  3:11 PM  PATIENT:  [name] [name]  70 y.o. male  PRE-OPERATIVE DIAGNOSIS:  Stenosis  POST-OPERATIVE DIAGNOSIS:  Stenosis  PROCEDURE:  Procedure(s): ANTERIOR CERVICAL DECOMPRESSION/DISCECTOMY FUSION CERVICAL FIVE-SIX,CERVICAL SIX-SEVEN (N/A)  SURGEON:  Surgeon(s) and Role:    * [name], MD - Primary    * Jovita Gamma, MD - Assisting  PHYSICIAN ASSISTANT:   ASSISTANTS:    ANESTHESIA:   general  EBL:  Total I/O In: 1000 [I.V.:1000] Out: 250 [Blood:250]  BLOOD ADMINISTERED:none  DRAINS: none   LOCAL MEDICATIONS USED:  NONE  SPECIMEN:  No Specimen  DISPOSITION OF SPECIMEN:  N/A  COUNTS:  YES  TOURNIQUET:  * No tourniquets in log *  DICTATION: .Dragon Dictation  PLAN OF CARE: Admit to inpatient   PATIENT DISPOSITION:  PACU - hemodynamically stable.   Delay start of Pharmacological VTE agent (>24hrs) due to surgical blood loss or risk of bleeding: yes

## 2016-05-31 NOTE — Op Note (Signed)
Date of procedure: 05/31/2016  Date of dictation: Same  Service: Neurosurgery  Preoperative diagnosis: C5-6, C6-7 spondylosis with foraminal stenosis and radiculopathy7  Postoperative diagnosis: Same   Procedure Name: C5-6, C6-7 anterior cervical discectomy and interbody fusion utilizing interbody peek cages, local harvested autograft, and anterior plate instrumentation.   Surgeon:Jibran Crookshanks A.Nima Bamburg, [name]  Asst. Surgeon: Sherwood Gambler  Anesthesia: General  Indication:70 year old male with chronic progressively worsening neck pain and radiculopathy. Workup demonstrates evidence of severe spondylosis and stenosis at C5-6 and C6-7. Patient presents now for 2 level anterior cervical decompression and fusion.  Operative note:After induction of anesthesia, patient positioned supine with Extended and held in place with a Holter traction. Anterior cervical region prepped and draped sterilely. Incision made overlying C6. Dissection performed on the right. Retractor placed. Fluoroscopy used. Level was confirmed. Discectomies were performed down to the posterior annulus. Microscope for field. Microscope used to remove the residual osteophytes and ligament using the high-speed drill. Posterior longitudinal ligament was elevated and resected piecemeal fashion. The underlying thecal sac was then identified. Wide central decompression performing undercutting the bodies of C5 and C6. Decompression then proceeded into each neural foramen. Wide anterior foraminotomies performed on course exiting C6 nerve roots bilaterally. Procedure then repeated at [id number] without complication. Wounds irrigated family solution. [employer] anatomic peek cages were packed with locally harvested autograft. Each cage was then impacted into place and recessed slightly from the anterior cortical margin. Anterior cervical plate placed over the C5, C6 and C7 levels. This is an attachment of fluoroscopic guidance with 13 mm variable-angle screws.  Locking screws engaged. Final images revealed good position of bone graft hardware at the proper level with normal alignment is fine. Wounds then irrigated one final time. Hemostasis was assured with bipolar cautery. Was closed in layers with Vicryl sutures. Steri-Strips and sterile dressing were applied. No apparent complications. Patient tolerated the procedure well and he returns to the recovery room postoperative

## 2016-05-31 NOTE — Anesthesia Procedure Notes (Signed)
Procedure Name: Intubation Date/Time: 05/31/2016 12:58 PM Performed by: Layla Maw Pre-anesthesia Checklist: Patient identified, Patient being monitored, Timeout performed, Emergency Drugs available and Suction available Patient Re-evaluated:Patient Re-evaluated prior to inductionOxygen Delivery Method: Circle System Utilized Preoxygenation: Pre-oxygenation with 100% oxygen Intubation Type: IV induction Ventilation: Mask ventilation without difficulty and Oral airway inserted - appropriate to patient size Laryngoscope Size: 3 and Glidescope Grade View: Grade I Tube type: Oral Tube size: 7.5 mm Number of attempts: 1 Airway Equipment and Method: Stylet and Video-laryngoscopy Placement Confirmation: ETT inserted through vocal cords under direct vision,  positive ETCO2 and breath sounds checked- equal and bilateral Secured at: 21 cm Tube secured with: Tape Dental Injury: Teeth and Oropharynx as per pre-operative assessment  Difficulty Due To: Difficult Airway- due to anterior larynx, Difficult Airway- due to reduced neck mobility and Difficulty was anticipated

## 2016-05-31 NOTE — H&P (Signed)
[name] [name] is an 70 y.o. male.   Chief Complaint: Neck and bilateral upper extremity pain HPI: 70 year old male with significant neck and bilateral upper extremity radicular pain failing conservative management. Workup demonstrates evidence of significant disc degeneration and spondylosis at C5-6 and more impressively at C6-7 causing marked foraminal stenosis. Patient has failed conservative management and presents now for 2 level anterior cervical discectomy and fusion in hopes of improving his symptoms.  Past Medical History  Diagnosis Date  . Diabetes mellitus type II, uncontrolled ([place])   . OSA (obstructive sleep apnea)   . Hyperlipidemia   . RBBB (right bundle branch block)   . Depression     Bipolar  . Edema   . Falling episodes     these have occurred in the past and again recurring 2011  . Spondylosis     C5-6, C6-7 MRI 2010  . CAD (coronary artery disease)     A. CABG in 2000,status post cardiac cath in 2006, 2009 ....continued chest pain and SOB despite oral medication adjestments including Ranexa. B. Cath November 2009/ mRCA - 2.75 x 23 Abbott Xience V drug-eluting stent ...11/26/2008 to distal  RCA leading to acute marginal.  C. Cath 07/2012 for CP - stable anatomy, med rx. d. cath 2015 and 05/30/2015 stable anatomy, consider Myoview if has CP again  . Hx of CABG     2000,  / one median sternotomy suture broken her chest x-ray November, 2010, no clinical significance  . Palpitations     event recorder showed sinus rhythm  . Nephrolithiasis   . Restless leg   . Dizziness   . Shortness of breath     CPX April, 2011, mild functional limitation, no clear pulmonary or cardiac limitation, possible deconditioning and mild chronotropic incompetence( peak heart rate 130)  . Cerebral ischemia     MRI November, 2010, chronic microvascular ischemia  . H/O medication noncompliance     Due to loss of insurance  . Ejection fraction     EF 60%, echo, July 31, 2012  . Diabetes  mellitus without complication ([place])   . Pain in limb 06/12/2009    Qualifier: Diagnosis of  By: [name]   . Low back pain 06/12/2009    Qualifier: Diagnosis of  By: [name]   . Thyroid disease   . Right knee pain 01/07/2015  . Advance care planning 05/01/2015  . ACP (advance care planning) 05/11/2015  . Nausea with vomiting 01/25/2016  . Headache 01/25/2016  . Diarrhea 02/15/2016  . Gastric ulcer   . Tubulovillous adenoma of colon 2007  . Syncope 03/2016  . Chronic diastolic CHF (congestive heart failure) ([place]) 03/13/2016  . Family history of adverse reaction to anesthesia     "mother died during bypass surgery but not sure if it has to do with anesthesia"  . Hypertension     pt. denies  . Anemia     hemoglobin 7.4, iron deficiency, January, 2011, 2 unit transfusion, endoscopy normal, capsule endoscopy February, 2011 no small bowel abnormalities.   Most likely source gastric erosions, followed by GI  . Iron deficiency anemia   . Wears glasses   . Anxiety   . GERD (gastroesophageal reflux disease)   . Hard of hearing     Past Surgical History  Procedure Laterality Date  . Nasal septum surgery      UP3  . Coronary artery bypass graft      2000  . Left  heart catheterization with coronary/graft angiogram N/A 08/01/2012    Procedure: LEFT HEART CATHETERIZATION WITH Beatrix Fetters;  Surgeon: [name], MD;  Location[facility] C[facility] LAB;  Service: Cardiovascular;  Laterality: N/A;  . Left heart catheterization with coronary/graft angiogram N/A 01/03/2015    Procedure: LEFT HEART CATHETERIZATION WITH Beatrix Fetters;  Surgeon: [name], MD;  Location: [facility] CATH LAB;  Service: Cardiovascular;  Laterality: N/A;  . Percutaneous coronary stent intervention (pci-s)  10/2008    mRCA PCI  2.75 x 23 Abbott Xience V drug-eluting stent   . Cardiac catheterization N/A 05/30/2015    Procedure: Left Heart Cath and Coronary Angiography;  Surgeon: [name], MD;   Location: [facility] CV LAB;  Service: Cardiovascular;  Laterality: N/A;  . Colonoscopy    . Esophagogastroduodenoscopy      Family History  Problem Relation Age of Onset  . Cancer Brother     pancreatic cancer  . Diabetes Brother     4   . Stroke Brother   . Diabetes Brother   . Cancer Brother   . Diabetes Brother   . Coronary artery disease Brother   . Diabetes Brother   . Hypothyroidism Brother   . Heart attack      Nephew  . Diabetes Mother   . Heart failure Mother   . Heart failure Father   . Irregular heart beat Daughter   . Cancer Maternal Grandmother     unknown    Social History:  reports that he has never smoked. He has never used smokeless tobacco. He reports that he does not drink alcohol or use illicit drugs.  Allergies:  Allergies  Allergen Reactions  . Morphine Other (See Comments)    hallucinations  . Shellfish-Derived Products Itching    Medications Prior to Admission  Medication Sig Dispense Refill  . atorvastatin (LIPITOR) 40 MG tablet TAKE 1 TABLET BY MOUTH DAILY AT 6PM 30 tablet 5  . baclofen (LIORESAL) 10 MG tablet Take 0.5 tablets (5 mg total) by mouth at bedtime. 30 each 0  . clopidogrel (PLAVIX) 75 MG tablet Take 1 tablet (75 mg total) by mouth daily. 30 tablet 3  . clotrimazole-betamethasone (LOTRISONE) cream Apply 1 application topically 2 (two) times daily.    Marland Kitchen gabapentin (NEURONTIN) 300 MG capsule Take 2 capsules (600 mg) by mouth  every morning, 1 capsule (300 mg) by mouth  at noon, and 2 capsules (600 mg) by mouth at night    . glucose blood (ACCU-CHEK AVIVA) test strip Use as directed to check blood sugar 4 times daily.  DX E11.9 200 each 6  . insulin aspart (NOVOLOG) 100 UNIT/ML injection Inject 10-17 Units into the skin 3 (three) times daily with meals. Use 10-17 unit 3 times daily before meals 15 mL 4  . Insulin Glargine (TOUJEO SOLOSTAR) 300 UNIT/ML SOPN Inject 42 Units into the skin at bedtime. 3 pen 4  . isosorbide mononitrate  (IMDUR) 120 MG 24 hr tablet Take 120 mg by mouth daily.    . isosorbide mononitrate (IMDUR) 60 MG 24 hr tablet TAKE 2 TABLETS BY MOUTH DAILY 180 tablet 0  . ondansetron (ZOFRAN-ODT) 4 MG disintegrating tablet Take 1 tablet (4 mg total) by mouth every 8 (eight) hours as needed for nausea or vomiting. 20 tablet 0  . pantoprazole (PROTONIX) 40 MG tablet Take 40 mg by mouth daily.    . pramipexole (MIRAPEX) 1 MG tablet Take 1 mg by mouth at bedtime.    Marland Kitchen  sertraline (ZOLOFT) 100 MG tablet Take 1 tablet (100 mg total) by mouth 2 (two) times daily. 180 tablet 1  . tiZANidine (ZANAFLEX) 4 MG tablet Take 1 tablet (4 mg total) by mouth every 8 (eight) hours as needed for muscle spasms. 90 tablet 2  . topiramate (TOPAMAX) 50 MG tablet Take 1 tablet (50 mg total) by mouth at bedtime. 30 tablet 3  . diclofenac sodium (VOLTAREN) 1 % GEL Apply 2 g topically daily as needed (for pain in affected area).    Marland Kitchen levothyroxine (SYNTHROID, LEVOTHROID) 75 MCG tablet Take 1 tablet (75 mcg total) by mouth daily. 90 tablet 2  . metFORMIN (GLUCOPHAGE) 1000 MG tablet TAKE 1 TABLET (1,000 MG TOTAL) BY MOUTH 2 (TWO) TIMES DAILY WITH A MEAL. 60 tablet 5  . midodrine (PROAMATINE) 10 MG tablet TAKE 1/2 TABLET (5 MG TOTAL) BY MOUTH 3 (THREE) TIMES DAILY WITH MEALS. 45 tablet 0  . nitroGLYCERIN (NITROSTAT) 0.4 MG SL tablet Place 0.4 mg under the tongue every 5 (five) minutes as needed for chest pain.    . ranolazine (RANEXA) 500 MG 12 hr tablet Take 500 mg by mouth 2 (two) times daily.      Results for orders placed or performed during the hospital encounter of 05/31/16 (from the past 48 hour(s))  Glucose, capillary     Status: Abnormal   Collection Time: 05/31/16 10:03 AM  Result Value Ref Range   Glucose-Capillary 235 (H) 65 - 99 mg/dL   Comment 1 Notify RN    Comment 2 Document in Chart    No results found.  Pertinent items noted in HPI and remainder of comprehensive ROS otherwise negative.  Blood pressure 150/65, pulse  64, temperature 98.1 F (36.7 C), temperature source Oral, resp. rate 20, weight 81.194 kg (179 lb), SpO2 96 %.  The patient is awake and alert. He is oriented and appropriate. His cranial nerve function is intact. His motor examination reveals slight weakness of triceps function bilaterally left greater than right. Patient with decreased sensation to pinprick and light touch in his left C6 and C7 dermatomes. Deep tendon reflexes normoactive. No evidence along track signs. Gait and posture normal. Examination head ears eyes nose and throat unremarkable. Chest and abdomen are benign. Extremities are free from injury or deformity. Assessment/Plan C5-6, C6-7 spondylosis with stenosis. Plan C5-6, C6-7 anterior cervical discectomy and interbody fusion utilizing interbody peek cages, we harvested autograft, and anterior plate instrumentation. Risks benefits been explained. Patient wishes to proceed.  Billal Rollo A 05/31/2016, 12:00 PM

## 2016-05-31 NOTE — Transfer of Care (Signed)
Immediate Anesthesia Transfer of Care Note  Patient: [name] [name]  Procedure(s) Performed: Procedure(s): ANTERIOR CERVICAL DECOMPRESSION/DISCECTOMY FUSION CERVICAL FIVE-SIX,CERVICAL SIX-SEVEN (N/A)  Patient Location: PACU  Anesthesia Type:General  Level of Consciousness: awake, alert , oriented and patient cooperative  Airway & Oxygen Therapy: Patient Spontanous Breathing and Patient connected to nasal cannula oxygen  Post-op Assessment: Report given to RN, Post -op Vital signs reviewed and stable and Patient moving all extremities X 4  Post vital signs: Reviewed and stable  Last Vitals:  Filed Vitals:   05/31/16 1523 05/31/16 1527  BP:  155/75  Pulse:  62  Temp: 36.3 C   Resp:  20    Last Pain: There were no vitals filed for this visit.       Complications: No apparent anesthesia complications

## 2016-06-01 ENCOUNTER — Encounter (HOSPITAL_COMMUNITY): Payer: Self-pay | Admitting: Neurosurgery

## 2016-06-01 LAB — GLUCOSE, CAPILLARY: GLUCOSE-CAPILLARY: 292 mg/dL — AB (ref 65–99)

## 2016-06-01 MED ORDER — HYDROCODONE-ACETAMINOPHEN 5-325 MG PO TABS: 1.0000 | ORAL_TABLET | ORAL | Status: DC | PRN

## 2016-06-01 MED ORDER — CYCLOBENZAPRINE HCL 10 MG PO TABS
10.0000 mg | ORAL_TABLET | Freq: Three times a day (TID) | ORAL | Status: DC | PRN
Start: 2016-06-01 — End: 2017-01-21

## 2016-06-01 MED ORDER — HYDROXYZINE HCL 25 MG PO TABS
50.0000 mg | ORAL_TABLET | Freq: Three times a day (TID) | ORAL | Status: DC | PRN
  Administered 2016-06-01: 50 mg via ORAL
  Filled 2016-06-01: qty 2

## 2016-06-01 MED FILL — CYCLOBENZAPRINE 10 MG TAB: 10 | 10 days supply | Qty: 30 | Fill #0

## 2016-06-01 NOTE — Progress Notes (Signed)
Patient alert and oriented, mae's well, voiding adequate amount of urine, swallowing without difficulty, c/o mild pain. Patient discharged home with family. Script and discharged instructions given to patient. Patient and family stated understanding of d/c instructions given and has an appointment with MD.   

## 2016-06-01 NOTE — Anesthesia Postprocedure Evaluation (Signed)
Anesthesia Post Note  Patient: [name] [name]  Procedure(s) Performed: Procedure(s) (LRB): ANTERIOR CERVICAL DECOMPRESSION/DISCECTOMY FUSION CERVICAL FIVE-SIX,CERVICAL SIX-SEVEN (N/A)  Patient location during evaluation: PACU Anesthesia Type: General Level of consciousness: awake and alert Pain management: pain level controlled Vital Signs Assessment: post-procedure vital signs reviewed and stable Respiratory status: spontaneous breathing, nonlabored ventilation, respiratory function stable and patient connected to nasal cannula oxygen Cardiovascular status: blood pressure returned to baseline and stable Postop Assessment: no signs of nausea or vomiting Anesthetic complications: no    Last Vitals:  Filed Vitals:   06/01/16 0339 06/01/16 0747  BP: 109/48 95/49  Pulse: 72 66  Temp: 36.4 C 36.4 C  Resp: 18 18    Last Pain:  Filed Vitals:   06/01/16 0801  PainSc: 3                  Tiajuana Amass

## 2016-06-01 NOTE — Discharge Summary (Signed)
Physician Discharge Summary  Patient ID: [name] [name] MRN: [id number] DOB/AGE: [birth year] 70 y.o.  Admit date: 05/31/2016 Discharge date: 06/01/2016  Admission Diagnoses:  Discharge Diagnoses:  Active Problems:   Foraminal stenosis of cervical region   Discharged Condition: good  Hospital Course: Patient admitted to the hospital where he underwent uncompensated two-level anterior cervical decompression and fusion. Postoperatively he is doing well. Preoperative neck and upper extremity symptoms are much improved. No motor or sensory complaints. Patient with a little bit of postop nausea but otherwise no complaints. Ready for discharge home.  Consults:   Significant Diagnostic Studies:   Treatments:   Discharge Exam: Blood pressure 95/49, pulse 66, temperature 97.5 F (36.4 C), temperature source Oral, resp. rate 18, weight 81.194 kg (179 lb), SpO2 96 %. Awake and alert. Oriented and appropriate. Cranial nerve function intact. Motor and sensory function extremities normal. Wound clean and dry however there is a great deal of ecchymosis without any swelling around his skin. Airway is midline. Swallowing well. No evidence of hematoma.  Disposition: 01-Home or Self Care     Medication List    TAKE these medications        atorvastatin 40 MG tablet  Commonly known as:  LIPITOR  TAKE 1 TABLET BY MOUTH DAILY AT 6PM     baclofen 10 MG tablet  Commonly known as:  LIORESAL  Take 0.5 tablets (5 mg total) by mouth at bedtime.     clopidogrel 75 MG tablet  Commonly known as:  PLAVIX  Take 1 tablet (75 mg total) by mouth daily.     clotrimazole-betamethasone cream  Commonly known as:  LOTRISONE  Apply 1 application topically 2 (two) times daily.     cyclobenzaprine 10 MG tablet  Commonly known as:  FLEXERIL  Take 1 tablet (10 mg total) by mouth 3 (three) times daily as needed for muscle spasms.     diclofenac sodium 1 % Gel  Commonly known as:  VOLTAREN  Apply 2 g  topically daily as needed (for pain in affected area).     gabapentin 300 MG capsule  Commonly known as:  NEURONTIN  Take 2 capsules (600 mg) by mouth  every morning, 1 capsule (300 mg) by mouth  at noon, and 2 capsules (600 mg) by mouth at night     glucose blood test strip  Commonly known as:  ACCU-CHEK AVIVA  Use as directed to check blood sugar 4 times daily.  DX E11.9     HYDROcodone-acetaminophen 5-325 MG tablet  Commonly known as:  NORCO/VICODIN  Take 1-2 tablets by mouth every 4 (four) hours as needed (mild pain).     insulin aspart 100 UNIT/ML injection  Commonly known as:  novoLOG  Inject 10-17 Units into the skin 3 (three) times daily with meals. Use 10-17 unit 3 times daily before meals     Insulin Glargine 300 UNIT/ML Sopn  Commonly known as:  TOUJEO SOLOSTAR  Inject 42 Units into the skin at bedtime.     isosorbide mononitrate 120 MG 24 hr tablet  Commonly known as:  IMDUR  Take 120 mg by mouth daily.     isosorbide mononitrate 60 MG 24 hr tablet  Commonly known as:  IMDUR  TAKE 2 TABLETS BY MOUTH DAILY     levothyroxine 75 MCG tablet  Commonly known as:  SYNTHROID, LEVOTHROID  Take 1 tablet (75 mcg total) by mouth daily.     metFORMIN 1000 MG tablet  Commonly known as:  GLUCOPHAGE  TAKE 1 TABLET (1,000 MG TOTAL) BY MOUTH 2 (TWO) TIMES DAILY WITH A MEAL.     midodrine 10 MG tablet  Commonly known as:  PROAMATINE  TAKE 1/2 TABLET (5 MG TOTAL) BY MOUTH 3 (THREE) TIMES DAILY WITH MEALS.     nitroGLYCERIN 0.4 MG SL tablet  Commonly known as:  NITROSTAT  Place 0.4 mg under the tongue every 5 (five) minutes as needed for chest pain.     ondansetron 4 MG disintegrating tablet  Commonly known as:  ZOFRAN-ODT  Take 1 tablet (4 mg total) by mouth every 8 (eight) hours as needed for nausea or vomiting.     pantoprazole 40 MG tablet  Commonly known as:  PROTONIX  Take 40 mg by mouth daily.     pramipexole 1 MG tablet  Commonly known as:  MIRAPEX  Take 1 mg by  mouth at bedtime.     ranolazine 500 MG 12 hr tablet  Commonly known as:  RANEXA  Take 500 mg by mouth 2 (two) times daily.     sertraline 100 MG tablet  Commonly known as:  ZOLOFT  Take 1 tablet (100 mg total) by mouth 2 (two) times daily.     tiZANidine 4 MG tablet  Commonly known as:  ZANAFLEX  Take 1 tablet (4 mg total) by mouth every 8 (eight) hours as needed for muscle spasms.     topiramate 50 MG tablet  Commonly known as:  TOPAMAX  Take 1 tablet (50 mg total) by mouth at bedtime.           Follow-up Information    Follow up with Charlie Pitter, MD.   Specialty:  Neurosurgery   Contact information:   [address] [place] [address] [phone]       Schedule an appointment as soon as possible for a visit in 2 weeks to follow up.      Signed: Preslynn Bier A 06/01/2016, 10:28 AM

## 2016-06-01 NOTE — Discharge Instructions (Signed)

## 2016-06-03 MED FILL — HYDROCODON-APAP 5-325: 5-325 | 7 days supply | Qty: 80 | Fill #0

## 2016-06-07 ENCOUNTER — Ambulatory Visit (HOSPITAL_COMMUNITY)
Admission: RE | Admit: 2016-06-07 | Discharge: 2016-06-07 | Disposition: A | Discharge status: Home / Self Care | Payer: Commercial Managed Care - HMO | Source: Ambulatory Visit | Attending: Nurse Practitioner | Admitting: Nurse Practitioner

## 2016-06-07 ENCOUNTER — Encounter: Payer: Self-pay | Admitting: Internal Medicine

## 2016-06-07 ENCOUNTER — Encounter (HOSPITAL_COMMUNITY): Payer: Self-pay | Admitting: Nurse Practitioner

## 2016-06-07 ENCOUNTER — Telehealth: Payer: Self-pay | Admitting: Internal Medicine

## 2016-06-07 VITALS — BP 136/76 | HR 95 | Ht 69.0 in | Wt 178.2 lb

## 2016-06-07 DIAGNOSIS — Z7902 Long term (current) use of antithrombotics/antiplatelets: Secondary | ICD-10-CM | POA: Diagnosis not present

## 2016-06-07 DIAGNOSIS — I10 Essential (primary) hypertension: Secondary | ICD-10-CM | POA: Diagnosis not present

## 2016-06-07 DIAGNOSIS — G4733 Obstructive sleep apnea (adult) (pediatric): Secondary | ICD-10-CM | POA: Diagnosis not present

## 2016-06-07 DIAGNOSIS — I480 Paroxysmal atrial fibrillation: Secondary | ICD-10-CM | POA: Insufficient documentation

## 2016-06-07 DIAGNOSIS — E119 Type 2 diabetes mellitus without complications: Secondary | ICD-10-CM | POA: Diagnosis not present

## 2016-06-07 DIAGNOSIS — Z8249 Family history of ischemic heart disease and other diseases of the circulatory system: Secondary | ICD-10-CM | POA: Diagnosis not present

## 2016-06-07 DIAGNOSIS — Z79899 Other long term (current) drug therapy: Secondary | ICD-10-CM | POA: Insufficient documentation

## 2016-06-07 DIAGNOSIS — Z794 Long term (current) use of insulin: Secondary | ICD-10-CM | POA: Insufficient documentation

## 2016-06-07 DIAGNOSIS — Z833 Family history of diabetes mellitus: Secondary | ICD-10-CM | POA: Insufficient documentation

## 2016-06-07 DIAGNOSIS — I4510 Unspecified right bundle-branch block: Secondary | ICD-10-CM | POA: Diagnosis not present

## 2016-06-07 DIAGNOSIS — Z8719 Personal history of other diseases of the digestive system: Secondary | ICD-10-CM | POA: Insufficient documentation

## 2016-06-07 DIAGNOSIS — E079 Disorder of thyroid, unspecified: Secondary | ICD-10-CM | POA: Insufficient documentation

## 2016-06-07 DIAGNOSIS — Z955 Presence of coronary angioplasty implant and graft: Secondary | ICD-10-CM | POA: Insufficient documentation

## 2016-06-07 DIAGNOSIS — I2510 Atherosclerotic heart disease of native coronary artery without angina pectoris: Secondary | ICD-10-CM | POA: Diagnosis not present

## 2016-06-07 DIAGNOSIS — E785 Hyperlipidemia, unspecified: Secondary | ICD-10-CM | POA: Insufficient documentation

## 2016-06-07 DIAGNOSIS — K219 Gastro-esophageal reflux disease without esophagitis: Secondary | ICD-10-CM | POA: Insufficient documentation

## 2016-06-07 DIAGNOSIS — Z951 Presence of aortocoronary bypass graft: Secondary | ICD-10-CM | POA: Insufficient documentation

## 2016-06-07 DIAGNOSIS — F319 Bipolar disorder, unspecified: Secondary | ICD-10-CM | POA: Diagnosis not present

## 2016-06-07 DIAGNOSIS — Z823 Family history of stroke: Secondary | ICD-10-CM | POA: Diagnosis not present

## 2016-06-07 DIAGNOSIS — Z885 Allergy status to narcotic agent status: Secondary | ICD-10-CM | POA: Insufficient documentation

## 2016-06-07 DIAGNOSIS — I4891 Unspecified atrial fibrillation: Secondary | ICD-10-CM | POA: Diagnosis present

## 2016-06-07 MED ORDER — METOPROLOL SUCCINATE ER 25 MG PO TB24: 25.0000 mg | ORAL_TABLET | Freq: Every day | ORAL | Status: DC

## 2016-06-07 MED ORDER — WARFARIN SODIUM 5 MG PO TABS: 5.0000 mg | ORAL_TABLET | Freq: Every day | ORAL | Status: DC

## 2016-06-07 MED FILL — WARFARIN SODIUM 5 MG TABLET: 5 | 30 days supply | Qty: 30 | Fill #0

## 2016-06-07 MED FILL — METOPROLOL SUCC ER 25 MG TA: 25 | 30 days supply | Qty: 30 | Fill #0

## 2016-06-07 NOTE — Patient Instructions (Signed)
error 

## 2016-06-07 NOTE — Progress Notes (Signed)
Patient ID: [name] [name], male   DOB: [birth year], 70 y.o.   MRN: [id number]     Primary Care Physician: Penni Homans, MD Referring Physician: Dr. Caryl Comes Cardiologist: Dr. Toula Moos is a 70 y.o. male with a h/o DM, HTN, OSA CAD,CABG in 2009, last stent in 2009 and last LHC in 2016 and disease was stable, stents patent.He was evaluated by Dr. Caryl Comes recently for spells of weakness, that were varied with abruptness of spells associated  with confusion, lightheadedness, near syncope. A holter monitor was placed and just recently showed an episode of AFib/Aflutter starting approximately 11PM Saturday night and lasting until approximately 4AM Sunday morning. Spoke with pt and he states that he "felt like my heart was coming out of my chest." Pt did have some SOB when Afib occuring. Pt denies any issues since these readings occurred. Dr. Klein and he would like for pt to be seen in Afib clinic for initiation of anticoagulants and rate augmentation.   Pt denies a bleeding history. He just had cervical neck surgery one week ago and restarted Plavix last Thursday. No recent falls. There is concern that pt will not be able to afford a DOAC and the wife and the pt after being informed on benefits/risks of warfarin, Eliquis/Xarelto, chose warfarin. No alcohol use, nonsmoker.NSR on EKG today.  Today, he denies symptoms of palpitations, chest pain, shortness of breath, orthopnea, PND, lower extremity edema, dizziness, presyncope, syncope, or neurologic sequela. The patient is tolerating medications without difficulties and is otherwise without complaint today.   Past Medical History  Diagnosis Date  . Diabetes mellitus type II, uncontrolled (HCC)   . OSA (obstructive sleep apnea)   . Hyperlipidemia   . RBBB (right bundle branch block)   . Depression     Bipolar  . Edema   . Falling episodes     these have occurred in the past and again recurring 2011  . Spondylosis     C5-6, C6-7  MRI 2010  . CAD (coronary artery disease)     A. CABG in 2000,status post cardiac cath in 2006, 2009 ....continued chest pain and SOB despite oral medication adjestments including Ranexa. B. Cath November 2009/ mRCA - 2.75 x 23 Abbott Xience V drug-eluting stent ...11/26/2008 to distal  RCA leading to acute marginal.  C. Cath 07/2012 for CP - stable anatomy, med rx. d. cath 2015 and 05/30/2015 stable anatomy, consider Myoview if has CP again  . Hx of CABG     20 00,  / one median sternotomy suture broken her chest x-ray November, 2010, no clinical significance  . Palpitations     event recorder showed sinus rhythm  . Nephrolithiasis   . Restless leg   . Dizziness   . Shortness of breath     CPX April, 2011, mild functional limitation, no clear pulmonary or cardiac limitation, possible deconditioning and mild chronotropic incompetence( peak heart rate 130)  . Cerebral ischemia     MRI November, 2010, chronic microvascular ischemia  . H/O medication noncompliance     Due to loss of insurance  . Ejection fraction     EF 60%, echo, July 31, 2012  . Diabetes mellitus without complication ([place])   . Pain in limb 06/12/2009    Qualifier: Diagnosis of  By: [name]   . Low back pain 06/12/2009    Qualifier: Diagnosis of  By: [name]   . Thyroid disease   . Right  knee pain 01/07/2015  . Advance care planning 05/01/2015  . ACP (advance care planning) 05/11/2015  . Nausea with vomiting 01/25/2016  . Headache 01/25/2016  . Diarrhea 02/15/2016  . Gastric ulcer   . Tubulovillous adenoma of colon 2007  . Syncope 03/2016  . Chronic diastolic CHF (congestive heart failure) ([place]) 03/13/2016  . Family history of adverse reaction to anesthesia     "mother died during bypass surgery but not sure if it has to do with anesthesia"  . Hypertension     pt. denies  . Anemia     hemoglobin 7.4, iron deficiency, January, 2011, 2 unit transfusion, endoscopy normal, capsule endoscopy February, 2011 no  small bowel abnormalities.   Most likely source gastric erosions, followed by GI  . Iron deficiency anemia   . Wears glasses   . Anxiety   . GERD (gastroesophageal reflux disease)   . Hard of hearing    Past Surgical History  Procedure Laterality Date  . Nasal septum surgery      UP3  . Coronary artery bypass graft      2000  . Left heart catheterization with coronary/graft angiogram N/A 08/01/2012    Procedure: LEFT HEART CATHETERIZATION WITH Beatrix Fetters;  Surgeon: [name], MD;  Location[facility] CATH LAB;  Service: Cardiovascular;  Laterality: N/A;  . Left heart catheterization with coronary/graft angiogram N/A 01/03/2015    Procedure: LEFT HEART CATHETERIZATION WITH Beatrix Fetters;  Surgeon: [name], MD;  Location: [facility] CATH LAB;  Service: Cardiovascular;  Laterality: N/A;  . Percutaneous coronary stent intervention (pci-s)  10/2008    mRCA PCI  2.75 x 23 Abbott Xience V drug-eluting stent   . Cardiac catheterization N/A 05/30/2015    Procedure: Left Heart Cath and Coronary Angiography;  Surgeon: [name], MD;  Location: [place] CV LAB;  Service: Cardiovascular;  Laterality: N/A;  . Colonoscopy    . Esophagogastroduodenoscopy    . Anterior cervical decomp/discectomy fusion N/A 05/31/2016    Procedure: ANTERIOR CERVICAL DECOMPRESSION/DISCECTOMY FUSION CERVICAL FIVE-SIX,CERVICAL SIX-SEVEN;  Surgeon: [name], MD;  Location: [place] NEURO ORS;  Service: Neurosurgery;  Laterality: N/A;    Current Outpatient Prescriptions  Medication Sig Dispense Refill  . atorvastatin (LIPITOR) 40 MG tablet TAKE 1 TABLET BY MOUTH DAILY AT 6PM 30 tablet 5  . baclofen (LIORESAL) 10 MG tablet Take 0.5 tablets (5 mg total) by mouth at bedtime. 30 each 0  . clopidogrel (PLAVIX) 75 MG tablet Take 1 tablet (75 mg total) by mouth daily. 30 tablet 3  . clotrimazole-betamethasone (LOTRISONE) cream Apply 1 application topically 2 (two) times daily.    . cyclobenzaprine  (FLEXERIL) 10 MG tablet Take 1 tablet (10 mg total) by mouth 3 (three) times daily as needed for muscle spasms. 30 tablet 0  . diclofenac sodium (VOLTAREN) 1 % GEL Apply 2 g topically daily as needed (for pain in affected area).    . gabapentin (NEURONTIN) 300 MG capsule Take 2 capsules (600 mg) by mouth  every morning, 1 capsule (300 mg) by mouth  at noon, and 2 capsules (600 mg) by mouth at night    . glucose blood (ACCU-CHEK AVIVA) test strip Use as directed to check blood sugar 4 times daily.  DX E11.9 200 each 6  . HYDROcodone-acetaminophen (NORCO/VICODIN) 5-325 MG tablet Take 1-2 tablets by mouth every 4 (four) hours as needed (mild pain). 80 tablet 0  . insulin aspart (NOVOLOG) 100 UNIT/ML injection Inject 10-17 Units into the skin 3 (three)  times daily with meals. Use 10-17 unit 3 times daily before meals 15 mL 4  . Insulin Glargine (TOUJEO SOLOSTAR) 300 UNIT/ML SOPN Inject 42 Units into the skin at bedtime. 3 pen 4  . isosorbide mononitrate (IMDUR) 120 MG 24 hr tablet Take 120 mg by mouth daily.    Marland Kitchen levothyroxine (SYNTHROID, LEVOTHROID) 75 MCG tablet Take 1 tablet (75 mcg total) by mouth daily. 90 tablet 2  . metFORMIN (GLUCOPHAGE) 1000 MG tablet TAKE 1 TABLET (1,000 MG TOTAL) BY MOUTH 2 (TWO) TIMES DAILY WITH A MEAL. 60 tablet 5  . nitroGLYCERIN (NITROSTAT) 0.4 MG SL tablet Place 0.4 mg under the tongue every 5 (five) minutes as needed for chest pain.    Marland Kitchen ondansetron (ZOFRAN-ODT) 4 MG disintegrating tablet Take 1 tablet (4 mg total) by mouth every 8 (eight) hours as needed for nausea or vomiting. 20 tablet 0  . pantoprazole (PROTONIX) 40 MG tablet Take 40 mg by mouth daily.    . pramipexole (MIRAPEX) 1 MG tablet Take 1 mg by mouth at bedtime.    . ranolazine (RANEXA) 500 MG 12 hr tablet Take 500 mg by mouth 2 (two) times daily.    . sertraline (ZOLOFT) 100 MG tablet Take 1 tablet (100 mg total) by mouth 2 (two) times daily. 180 tablet 1  . tiZANidine (ZANAFLEX) 4 MG tablet Take 1 tablet  (4 mg total) by mouth every 8 (eight) hours as needed for muscle spasms. 90 tablet 2  . topiramate (TOPAMAX) 50 MG tablet Take 1 tablet (50 mg total) by mouth at bedtime. 30 tablet 3  . metoprolol succinate (TOPROL XL) 25 MG 24 hr tablet Take 1 tablet (25 mg total) by mouth daily. 30 tablet 6  . warfarin (COUMADIN) 5 MG tablet Take 1 tablet (5 mg total) by mouth daily. 30 tablet 3   No current facility-administered medications for this encounter.    Allergies  Allergen Reactions  . Morphine Other (See Comments)    hallucinations  . Shellfish-Derived Products Itching    Social History   Social History  . Marital Status: Married    Spouse Name: N/A  . Number of Children: 4  . Years of Education: 13   Occupational History  . retired    Social History Main Topics  . Smoking status: Never Smoker   . Smokeless tobacco: Never Used  . Alcohol Use: No     Comment: stopped drinking in 1998  . Drug Use: No  . Sexual Activity:    Partners: Female   Other Topics Concern  . Not on file   Social History Narrative   Patient is right handed.   Patient does not drink caffeine.    Family History  Problem Relation Age of Onset  . Cancer Brother     pancreatic cancer  . Diabetes Brother     4   . Stroke Brother   . Diabetes Brother   . Cancer Brother   . Diabetes Brother   . Coronary artery disease Brother   . Diabetes Brother   . Hypothyroidism Brother   . Heart attack      Nephew  . Diabetes Mother   . Heart failure Mother   . Heart failure Father   . Irregular heart beat Daughter   . Cancer Maternal Grandmother     unknown     ROS- All systems are reviewed and negative except as per the HPI above  Physical Exam: Filed Vitals:   06/07/16 1514  BP: 136/76  Pulse: 95  Height: 5\' 9"  (1.753 m)  Weight: 178 lb 3.2 oz (80.831 kg)    GEN- The patient is well appearing, alert and oriented x 3 today.   Head- normocephalic, atraumatic Eyes-  Sclera clear, conjunctiva  pink Ears- hearing intact Oropharynx- clear Neck- supple, no JVP Lymph- no cervical lymphadenopathy Lungs- Clear to ausculation bilaterally, normal work of breathing Heart- Regular rate and rhythm, no murmurs, rubs or gallops, PMI not laterally displaced GI- soft, NT, ND, + BS Extremities- no clubbing, cyanosis, or edema MS- no significant deformity or atrophy Skin- no rash or lesion Psych- euthymic mood, full affect Neuro- strength and sensation are intact  EKG-NSR, RBBB, pr int 152 ms, qrs int 126 ms, qtc 482 ms Notes reviewed Epic records reviewed  Assessment and Plan: 1. Symptomatic PAF Will start metoprolol xl 25 mg at hs for rate control and pt can take an extra 1/2 tab if any episodes of rapid heart beat. Continue holter monitor until 30 days completed. Spoke to PharmD in coumadin clinic and will start 5mg  warfarin at dinnertime and he will have f/u/INR in the coumadin clinic on Monday 7/10. Messaged [name] and will be ok for pt to stop plavix since last stent was in 2009.  f/u with [name] as scheduled 7/18 and afib clinic as needed  Geroge Baseman. Atha Muradyan, [facility] [address] [place], [place] [address] [phone]

## 2016-06-07 NOTE — Patient Instructions (Signed)
Your physician has recommended you make the following change in your medication:  1)Start Metoprolol (toprol) 25mg  once a day 2)Start Coumadin 5mg  once a day

## 2016-06-07 NOTE — Telephone Encounter (Signed)
Received nine monitor tracings from pt's event monitor.  Each showed episodes of AFib/Aflutter starting approximately 11PM Saturday night and lasting until approximately 4AM Sunday morning.  Spoke with pt and he states that he "felt like my heart was coming out of my chest."  Pt did have some SOB when Afib occuring. Pt denies any issues since these readings occurred.  Spoke with Dr. Caryl Comes and he would like for pt to be seen in Afib clinic today or Wednesday.  Spoke with Erline Levine at [facility] clinic and they are able to see him today at [place] with pt and his wife, with his verbal permission, and advised them of recommendations per Dr. Caryl Comes.  Both are agreeable for pt to be seen today at [facility].  Directions and gate code provided.  Provided phone number to Afib clinic incase they have any trouble finding it.  Pt and wife verbalized understanding and were appreciative for phone call.

## 2016-06-10 ENCOUNTER — Emergency Department (HOSPITAL_COMMUNITY)
Admission: EM | Admit: 2016-06-10 | Discharge: 2016-06-10 | Disposition: A | Discharge status: Home / Self Care | Payer: Commercial Managed Care - HMO | Attending: Emergency Medicine | Admitting: Emergency Medicine

## 2016-06-10 ENCOUNTER — Encounter (HOSPITAL_COMMUNITY): Payer: Self-pay | Admitting: *Deleted

## 2016-06-10 ENCOUNTER — Emergency Department (HOSPITAL_COMMUNITY): Payer: Commercial Managed Care - HMO

## 2016-06-10 ENCOUNTER — Telehealth: Payer: Self-pay | Admitting: Interventional Cardiology

## 2016-06-10 DIAGNOSIS — Y939 Activity, unspecified: Secondary | ICD-10-CM | POA: Insufficient documentation

## 2016-06-10 DIAGNOSIS — Z7902 Long term (current) use of antithrombotics/antiplatelets: Secondary | ICD-10-CM | POA: Diagnosis not present

## 2016-06-10 DIAGNOSIS — E119 Type 2 diabetes mellitus without complications: Secondary | ICD-10-CM | POA: Insufficient documentation

## 2016-06-10 DIAGNOSIS — I110 Hypertensive heart disease with heart failure: Secondary | ICD-10-CM | POA: Diagnosis not present

## 2016-06-10 DIAGNOSIS — Y999 Unspecified external cause status: Secondary | ICD-10-CM | POA: Insufficient documentation

## 2016-06-10 DIAGNOSIS — Z794 Long term (current) use of insulin: Secondary | ICD-10-CM | POA: Insufficient documentation

## 2016-06-10 DIAGNOSIS — I481 Persistent atrial fibrillation: Secondary | ICD-10-CM

## 2016-06-10 DIAGNOSIS — Y929 Unspecified place or not applicable: Secondary | ICD-10-CM | POA: Diagnosis not present

## 2016-06-10 DIAGNOSIS — Z951 Presence of aortocoronary bypass graft: Secondary | ICD-10-CM | POA: Diagnosis not present

## 2016-06-10 DIAGNOSIS — R55 Syncope and collapse: Secondary | ICD-10-CM

## 2016-06-10 DIAGNOSIS — Z79899 Other long term (current) drug therapy: Secondary | ICD-10-CM | POA: Diagnosis not present

## 2016-06-10 DIAGNOSIS — I5032 Chronic diastolic (congestive) heart failure: Secondary | ICD-10-CM | POA: Diagnosis not present

## 2016-06-10 DIAGNOSIS — W19XXXA Unspecified fall, initial encounter: Secondary | ICD-10-CM | POA: Diagnosis not present

## 2016-06-10 DIAGNOSIS — Z7901 Long term (current) use of anticoagulants: Secondary | ICD-10-CM | POA: Insufficient documentation

## 2016-06-10 DIAGNOSIS — I2510 Atherosclerotic heart disease of native coronary artery without angina pectoris: Secondary | ICD-10-CM | POA: Insufficient documentation

## 2016-06-10 LAB — CBC WITH DIFFERENTIAL/PLATELET
BASOS ABS: 0 10*3/uL (ref 0.0–0.1)
BASOS PCT: 0 %
Eosinophils Absolute: 0.3 10*3/uL (ref 0.0–0.7)
Eosinophils Relative: 3 %
HEMATOCRIT: 34.1 % — AB (ref 39.0–52.0)
HEMOGLOBIN: 11.6 g/dL — AB (ref 13.0–17.0)
Lymphocytes Relative: 22 %
Lymphs Abs: 1.9 10*3/uL (ref 0.7–4.0)
MCH: 30.3 pg (ref 26.0–34.0)
MCHC: 34 g/dL (ref 30.0–36.0)
MCV: 89 fL (ref 78.0–100.0)
MONO ABS: 0.4 10*3/uL (ref 0.1–1.0)
Monocytes Relative: 5 %
NEUTROS ABS: 5.9 10*3/uL (ref 1.7–7.7)
NEUTROS PCT: 70 %
PLATELETS: 244 10*3/uL (ref 150–400)
RBC: 3.83 MIL/uL — ABNORMAL LOW (ref 4.22–5.81)
RDW: 13.2 % (ref 11.5–15.5)
WBC: 8.5 10*3/uL (ref 4.0–10.5)

## 2016-06-10 LAB — COMPREHENSIVE METABOLIC PANEL
ALT: 13 U/L — AB (ref 17–63)
AST: 19 U/L (ref 15–41)
Albumin: 3.3 g/dL — ABNORMAL LOW (ref 3.5–5.0)
Alkaline Phosphatase: 82 U/L (ref 38–126)
Anion gap: 9 (ref 5–15)
BUN: 7 mg/dL (ref 6–20)
CHLORIDE: 103 mmol/L (ref 101–111)
CO2: 22 mmol/L (ref 22–32)
CREATININE: 0.91 mg/dL (ref 0.61–1.24)
Calcium: 9 mg/dL (ref 8.9–10.3)
Glucose, Bld: 226 mg/dL — ABNORMAL HIGH (ref 65–99)
POTASSIUM: 4.2 mmol/L (ref 3.5–5.1)
SODIUM: 134 mmol/L — AB (ref 135–145)
Total Bilirubin: 0.6 mg/dL (ref 0.3–1.2)
Total Protein: 6.2 g/dL — ABNORMAL LOW (ref 6.5–8.1)

## 2016-06-10 LAB — PROTIME-INR
INR: 1.02 (ref 0.00–1.49)
PROTHROMBIN TIME: 13.6 s (ref 11.6–15.2)

## 2016-06-10 MED ORDER — HYDROCODONE-ACETAMINOPHEN 5-325 MG PO TABS
1.0000 | ORAL_TABLET | Freq: Once | ORAL | Status: AC
  Administered 2016-06-10: 1 via ORAL
  Filled 2016-06-10: qty 1

## 2016-06-10 NOTE — Progress Notes (Signed)
D/W Dr. Liston Alba regarding [name] [name]. Just seen by [name] Palau, [name] [name] in the [facility] for new onset a-fib, noted on monitor. Was seen by Dr. Caryl Comes prior, who remarked that recently he has been having "spells of weakness, that were varied with abruptness of spells associated with confusion, lightheadedness, near syncope". Despite this, it was recommended that he stop Plavix and start warfarin anticoagulation on 7/3. He presents today with weakness, confusion and 2 falls yesterday for which he struck his head. Recent had cervical spine surgery. CT scan shows this is intact and fortunately there is no intracerebral hemorrhage. His INR is still 1.02 on his current dose. ER is concerned about keeping him on warfarin going forward. I agree this will need to be discussed in light of his falls with [name] [name]. Not clear if something related to cervical spine surgery is contributing to his dizziness and near syncope. I advised continuing warfarin for now, in the absence of bleeding. I will reach out to [name] [name] to see if he can been seen in the clinic tomorrow (Friday) or early next week and he will need a coumadin clinic appointment soon, since his INR remains low - will likely need an upward dose adjustment if it is planned to continue warfarin.  Pixie Casino, MD, [facility] Attending Cardiologist [place]

## 2016-06-10 NOTE — ED Notes (Signed)
MD at bedside. 

## 2016-06-10 NOTE — ED Notes (Signed)
Pt states he feels dizzy when he stands, informed Hayley-RN.

## 2016-06-10 NOTE — ED Provider Notes (Signed)
CSN: [id number]     Arrival date & time 06/10/16  1649 History   First MD Initiated Contact with Patient 06/10/16 1703     Chief Complaint  Patient presents with  . Transient Ischemic Attack  . Fall     (Consider location/radiation/quality/duration/timing/severity/associated sxs/prior Treatment) Patient is a 70 y.o. male presenting with syncope. The history is provided by the patient.  Loss of Consciousness Episode history:  Multiple Most recent episode:  Today Timing:  Constant Progression:  Unchanged Chronicity:  Chronic Context: standing up   Context: not blood draw   Witnessed: yes   Relieved by:  Nothing Worsened by:  Nothing tried Associated symptoms: confusion and nausea   Associated symptoms: no chest pain, no dizziness, no fever, no palpitations, no shortness of breath and no vomiting     Past Medical History  Diagnosis Date  . Diabetes mellitus type II, uncontrolled ([place])   . OSA (obstructive sleep apnea)   . Hyperlipidemia   . RBBB (right bundle branch block)   . Depression     Bipolar  . Edema   . Falling episodes     these have occurred in the past and again recurring 2011  . Spondylosis     C5-6, C6-7 MRI 2010  . CAD (coronary artery disease)     A. CABG in 2000,status post cardiac cath in 2006, 2009 ....continued chest pain and SOB despite oral medication adjestments including Ranexa. B. Cath November 2009/ mRCA - 2.75 x 23 Abbott Xience V drug-eluting stent ...11/26/2008 to distal  RCA leading to acute marginal.  C. Cath 07/2012 for CP - stable anatomy, med rx. d. cath 2015 and 05/30/2015 stable anatomy, consider Myoview if has CP again  . Hx of CABG     2000,  / one median sternotomy suture broken her chest x-ray November, 2010, no clinical significance  . Palpitations     event recorder showed sinus rhythm  . Nephrolithiasis   . Restless leg   . Dizziness   . Shortness of breath     CPX April, 2011, mild functional limitation, no clear pulmonary or  cardiac limitation, possible deconditioning and mild chronotropic incompetence( peak heart rate 130)  . Cerebral ischemia     MRI November, 2010, chronic microvascular ischemia  . H/O medication noncompliance     Due to loss of insurance  . Ejection fraction     EF 60%, echo, July 31, 2012  . Diabetes mellitus without complication ([place])   . Pain in limb 06/12/2009    Qualifier: Diagnosis of  By: [name]   . Low back pain 06/12/2009    Qualifier: Diagnosis of  By: [name]   . Thyroid disease   . Right knee pain 01/07/2015  . Advance care planning 05/01/2015  . ACP (advance care planning) 05/11/2015  . Nausea with vomiting 01/25/2016  . Headache 01/25/2016  . Diarrhea 02/15/2016  . Gastric ulcer   . Tubulovillous adenoma of colon 2007  . Syncope 03/2016  . Chronic diastolic CHF (congestive heart failure) ([place]) 03/13/2016  . Family history of adverse reaction to anesthesia     "mother died during bypass surgery but not sure if it has to do with anesthesia"  . Hypertension     pt. denies  . Anemia     hemoglobin 7.4, iron deficiency, January, 2011, 2 unit transfusion, endoscopy normal, capsule endoscopy February, 2011 no small bowel abnormalities.   Most likely source gastric erosions, followed by  GI  . Iron deficiency anemia   . Wears glasses   . Anxiety   . GERD (gastroesophageal reflux disease)   . Hard of hearing    Past Surgical History  Procedure Laterality Date  . Nasal septum surgery      UP3  . Coronary artery bypass graft      2000  . Left heart catheterization with coronary/graft angiogram N/A 08/01/2012    Procedure: LEFT HEART CATHETERIZATION WITH Beatrix Fetters;  Surgeon: [name], MD;  Location: [facility] CATH LAB;  Service: Cardiovascular;  Laterality: N/A;  . Left heart catheterization with coronary/graft angiogram N/A 01/03/2015    Procedure: LEFT HEART CATHETERIZATION WITH Beatrix Fetters;  Surgeon: [name], MD;  Location:  [facility] CATH LAB;  Service: Cardiovascular;  Laterality: N/A;  . Percutaneous coronary stent intervention (pci-s)  10/2008    mRCA PCI  2.75 x 23 Abbott Xience V drug-eluting stent   . Cardiac catheterization N/A 05/30/2015    Procedure: Left Heart Cath and Coronary Angiography;  Surgeon: [name], MD;  Location: [facility] CV LAB;  Service: Cardiovascular;  Laterality: N/A;  . Colonoscopy    . Esophagogastroduodenoscopy    . Anterior cervical decomp/discectomy fusion N/A 05/31/2016    Procedure: ANTERIOR CERVICAL DECOMPRESSION/DISCECTOMY FUSION CERVICAL FIVE-SIX,CERVICAL SIX-SEVEN;  Surgeon: [name], MD;  Location: [place] NEURO ORS;  Service: Neurosurgery;  Laterality: N/A;   Family History  Problem Relation Age of Onset  . Cancer Brother     pancreatic cancer  . Diabetes Brother     4   . Stroke Brother   . Diabetes Brother   . Cancer Brother   . Diabetes Brother   . Coronary artery disease Brother   . Diabetes Brother   . Hypothyroidism Brother   . Heart attack      Nephew  . Diabetes Mother   . Heart failure Mother   . Heart failure Father   . Irregular heart beat Daughter   . Cancer Maternal Grandmother     unknown    Social History  Substance Use Topics  . Smoking status: Never Smoker   . Smokeless tobacco: Never Used  . Alcohol Use: No     Comment: stopped drinking in 1998    Review of Systems  Constitutional: Negative for fever and chills.  HENT: Negative for congestion and sore throat.   Eyes: Negative for pain.  Respiratory: Negative for cough and shortness of breath.   Cardiovascular: Positive for syncope. Negative for chest pain and palpitations.  Gastrointestinal: Positive for nausea. Negative for vomiting, abdominal pain and diarrhea.  Endocrine: Negative.   Genitourinary: Negative for flank pain.  Musculoskeletal: Negative for back pain and neck pain.  Skin: Negative for rash.  Allergic/Immunologic: Negative.   Neurological: Negative for dizziness,  syncope and light-headedness.  Psychiatric/Behavioral: Positive for confusion and agitation.      Allergies  Morphine and Shellfish-derived products  Home Medications   Prior to Admission medications   Medication Sig Start Date End Date Taking? Authorizing Provider  atorvastatin (LIPITOR) 40 MG tablet TAKE 1 TABLET BY MOUTH DAILY AT 6PM 04/14/16  Yes Almyra Deforest, PA  baclofen (LIORESAL) 10 MG tablet Take 0.5 tablets (5 mg total) by mouth at bedtime. 03/11/16  Yes Bonnielee Haff, MD  clopidogrel (PLAVIX) 75 MG tablet Take 1 tablet (75 mg total) by mouth daily. 03/11/16  Yes Bonnielee Haff, MD  clotrimazole-betamethasone (LOTRISONE) cream Apply 1 application topically 2 (two) times daily.   Yes Historical  Provider, MD  cyclobenzaprine (FLEXERIL) 10 MG tablet Take 1 tablet (10 mg total) by mouth 3 (three) times daily as needed for muscle spasms. 06/01/16  Yes [name], MD  diclofenac sodium (VOLTAREN) 1 % GEL Apply 2 g topically daily as needed (for pain in affected area).   Yes Historical Provider, MD  gabapentin (NEURONTIN) 300 MG capsule Take 2 capsules (600 mg) by mouth  every morning, 1 capsule (300 mg) by mouth  at noon, and 2 capsules (600 mg) by mouth at night   Yes Historical Provider, MD  HYDROcodone-acetaminophen (NORCO/VICODIN) 5-325 MG tablet Take 1-2 tablets by mouth every 4 (four) hours as needed (mild pain). 06/01/16  Yes [name], MD  insulin aspart (NOVOLOG) 100 UNIT/ML injection Inject 10-17 Units into the skin 3 (three) times daily with meals. Use 10-17 unit 3 times daily before meals 04/26/16  Yes [name], MD  Insulin Glargine (TOUJEO SOLOSTAR) 300 UNIT/ML SOPN Inject 42 Units into the skin at bedtime. 04/26/16  Yes [name], MD  isosorbide mononitrate (IMDUR) 120 MG 24 hr tablet Take 120 mg by mouth daily.   Yes Historical Provider, MD  levothyroxine (SYNTHROID, LEVOTHROID) 75 MCG tablet Take 1 tablet (75 mcg total) by mouth daily. 02/09/16  Yes [name], MD   metFORMIN (GLUCOPHAGE) 1000 MG tablet TAKE 1 TABLET (1,000 MG TOTAL) BY MOUTH 2 (TWO) TIMES DAILY WITH A MEAL. 05/27/16  Yes [name], MD  metoprolol succinate (TOPROL XL) 25 MG 24 hr tablet Take 1 tablet (25 mg total) by mouth daily. 06/07/16  Yes Sherran Needs, NP  midodrine (PROAMATINE) 10 MG tablet  05/27/16  Yes Historical Provider, MD  nitroGLYCERIN (NITROSTAT) 0.4 MG SL tablet Place 0.4 mg under the tongue every 5 (five) minutes as needed for chest pain.   Yes Historical Provider, MD  ondansetron (ZOFRAN-ODT) 4 MG disintegrating tablet Take 1 tablet (4 mg total) by mouth every 8 (eight) hours as needed for nausea or vomiting. 03/17/16  Yes Brunetta Jeans, PA-C  pantoprazole (PROTONIX) 40 MG tablet Take 40 mg by mouth daily.   Yes Historical Provider, MD  pramipexole (MIRAPEX) 1 MG tablet Take 1 mg by mouth at bedtime. 03/08/16  Yes Historical Provider, MD  ranolazine (RANEXA) 500 MG 12 hr tablet Take 500 mg by mouth 2 (two) times daily.   Yes Historical Provider, MD  sertraline (ZOLOFT) 100 MG tablet Take 1 tablet (100 mg total) by mouth 2 (two) times daily. 07/24/15  Yes [name], MD  topiramate (TOPAMAX) 50 MG tablet Take 1 tablet (50 mg total) by mouth at bedtime. 04/25/15  Yes Pieter Partridge, DO  warfarin (COUMADIN) 5 MG tablet Take 1 tablet (5 mg total) by mouth daily. 06/07/16  Yes Sherran Needs, NP  glucose blood (ACCU-CHEK AVIVA) test strip Use as directed to check blood sugar 4 times daily.  DX E11.9 02/09/16   [name], MD  tiZANidine (ZANAFLEX) 4 MG tablet Take 1 tablet (4 mg total) by mouth every 8 (eight) hours as needed for muscle spasms. 11/19/15   Pieter Partridge, DO   BP 131/81 mmHg  Pulse 79  Temp(Src) 98.7 F (37.1 C) (Oral)  Resp 16  SpO2 97% Physical Exam  Constitutional: He is oriented to person, place, and time. He appears well-developed and well-nourished.  HENT:  Head: Normocephalic and atraumatic.  Eyes: Conjunctivae and EOM are normal. Pupils are  equal, round, and reactive to light.  Neck:  Normal range of motion. Neck supple.  Cardiovascular: Normal rate, regular rhythm, normal heart sounds and intact distal pulses.   Pulmonary/Chest: Effort normal and breath sounds normal. No respiratory distress.  Abdominal: Soft. Bowel sounds are normal. There is no tenderness.  Musculoskeletal: Normal range of motion.  Neurological: He is alert and oriented to person, place, and time. He has normal strength and normal reflexes. No cranial nerve deficit or sensory deficit. He displays a negative Romberg sign.  Normal finger to nose bilaterally.   No pronator drift bilaterally.    Skin: Skin is warm and dry.    ED Course  Procedures (including critical care time) Labs Review Labs Reviewed  CBC WITH DIFFERENTIAL/PLATELET - Abnormal; Notable for the following:    RBC 3.83 (*)    Hemoglobin 11.6 (*)    HCT 34.1 (*)    All other components within normal limits  COMPREHENSIVE METABOLIC PANEL - Abnormal; Notable for the following:    Sodium 134 (*)    Glucose, Bld 226 (*)    Total Protein 6.2 (*)    Albumin 3.3 (*)    ALT 13 (*)    All other components within normal limits  PROTIME-INR    Imaging Review Ct Head Wo Contrast  06/10/2016  CLINICAL DATA:  Two falls over the past 2 days. Left-sided weakness. Intermittent slurred speech. Cervical spine surgery 05/31/2016. EXAM: CT HEAD WITHOUT CONTRAST CT CERVICAL SPINE WITHOUT CONTRAST TECHNIQUE: Multidetector CT imaging of the head and cervical spine was performed following the standard protocol without intravenous contrast. Multiplanar CT image reconstructions of the cervical spine were also generated. COMPARISON:  Head CT 04/02/2016. FINDINGS: CT HEAD FINDINGS No intracranial hemorrhage, mass effect, or midline shift. No hydrocephalus. The basilar cisterns are patent. No hyperdense vessel. No evidence of territorial infarct. No intracranial fluid collection. Calvarium is intact. Included paranasal  sinuses and mastoid air cells are well aerated. CT CERVICAL SPINE FINDINGS Anterior fusion C5 through C7 with interbody spacers. Hardware is intact. No evidence of hemorrhage or hematoma in the spinal canal. There is soft tissue thickening about the anterior plate that is partially obscured by streak artifact, no large fluid collection. Trace anterolisthesis of C7 on T1 is unchanged from preoperative MRI. Alignment is otherwise maintained. No acute fracture. The dens is intact. Multilevel facet arthropathy is seen. IMPRESSION: 1.  No acute intracranial abnormality. 2. Post recent anterior fusion C5 through C7 without evident complication. Mild anterior soft tissue thickening is likely postsurgical, no focal fluid collection. No acute fracture. Electronically Signed   By: Jeb Levering [name]   On: 06/10/2016 18:58   Ct Cervical Spine Wo Contrast  06/10/2016  CLINICAL DATA:  Two falls over the past 2 days. Left-sided weakness. Intermittent slurred speech. Cervical spine surgery 05/31/2016. EXAM: CT HEAD WITHOUT CONTRAST CT CERVICAL SPINE WITHOUT CONTRAST TECHNIQUE: Multidetector CT imaging of the head and cervical spine was performed following the standard protocol without intravenous contrast. Multiplanar CT image reconstructions of the cervical spine were also generated. COMPARISON:  Head CT 04/02/2016. FINDINGS: CT HEAD FINDINGS No intracranial hemorrhage, mass effect, or midline shift. No hydrocephalus. The basilar cisterns are patent. No hyperdense vessel. No evidence of territorial infarct. No intracranial fluid collection. Calvarium is intact. Included paranasal sinuses and mastoid air cells are well aerated. CT CERVICAL SPINE FINDINGS Anterior fusion C5 through C7 with interbody spacers. Hardware is intact. No evidence of hemorrhage or hematoma in the spinal canal. There is soft tissue thickening about the anterior plate that is  partially obscured by streak artifact, no large fluid collection. Trace  anterolisthesis of C7 on T1 is unchanged from preoperative MRI. Alignment is otherwise maintained. No acute fracture. The dens is intact. Multilevel facet arthropathy is seen. IMPRESSION: 1.  No acute intracranial abnormality. 2. Post recent anterior fusion C5 through C7 without evident complication. Mild anterior soft tissue thickening is likely postsurgical, no focal fluid collection. No acute fracture. Electronically Signed   By: Jeb Levering [name]   On: 06/10/2016 18:58   Dg Chest Portable 1 View  06/10/2016  CLINICAL DATA:  Left-sided weakness status post fall. EXAM: PORTABLE CHEST 1 VIEW COMPARISON:  None. FINDINGS: There is no focal parenchymal opacity. There is no pleural effusion or pneumothorax. The heart and mediastinal contours are unremarkable. There is evidence of prior CABG. The osseous structures are unremarkable. IMPRESSION: No active disease. Electronically Signed   By: Kathreen Devoid   On: 06/10/2016 17:54   I have personally reviewed and evaluated these images and lab results as part of my medical decision-making.   EKG Interpretation   Date/Time:  Thursday June 10 2016 16:57:52 EDT Ventricular Rate:  81 PR Interval:    QRS Duration: 121 QT Interval:  387 QTC Calculation: 450 R Axis:   11 Text Interpretation:  Sinus rhythm Ventricular premature complex Right  bundle branch block No significant change since last tracing Confirmed by  [facility]  MD, MARTHA [phone]) on 06/10/2016 5:13:02 PM      MDM   Final diagnoses:  Syncope and collapse    The pt is a 70 yo male presenting to the ED for multiple episodes of syncope and collapse associated with confusion, possible hallucinations and return to baseline after a prolonged period.  Wife reports this has been ongoing for several months but has worsened over the last several days since his recent cervical fusion.  Discussed multiple falls with his cardiologist and spine surgeon and advised to present to the ED.  Of note, he has  well documented hx of these falls with hypotension and prolonged episodes of confusion with unknown etiology after extensive workup as outpatient.   On exam pt HDS in NAD and a&O x 3.  No focal neurologic deficits and reports no weakness during these events.  Low suspicion for TIA/stroke.  CT head and cervical spine unremarkable.  ECG without arrhythmogenic potential or ischemia.  CBC, CMP unremarkable.  Low suspicion for infectious etiology including meningitis.  Pt with a-fib as outpatient but NSR in ED.  On plavix and transitioning to coumadin but INR sub therapeutic in the ED.  Discussed with on call cardiologist and displayed concerns about recurrent syncope/falls with anticoagulation. He recommended to continue coumadin as scheduled and will arrange close f/u as an outpatient in the next 1-4 days.  I doubt acute life threatening etiology of syncopal episodes and family feels comfortable returning home with close outpatient f/u.   Labs were viewed by myself  incorporated into medical decision making.  Discussed pertinent finding with patient or caregiver prior to discharge with no further questions.  Immediate return precautions given and understood.  Medical decision making supervised by my attending Dr. Canary Brim.   Geronimo Boot, MD PGY-3 Emergency Medicine     Geronimo Boot, MD 06/11/16 [place], MD 06/11/16 [phone]

## 2016-06-10 NOTE — Telephone Encounter (Signed)
I attempted to call the patient's wife back. No answer/ no voice mail set up. Will attempt to call back later.

## 2016-06-10 NOTE — ED Notes (Signed)
Pt presents via [facility] from home with multiple ongoing issues.  EMS reports episodes consistent with TIA sx for 2 days-slurred speech, BL LE weakness, and confusion.  Episodes lasting 25 mins-1 hour.  Pt also has been wearing a holter monitor for approximately 30 days for intermittent Afib-3 episodes yesterday.  Pt also has had two falls-one yesterday hitting his head -LOC, and one today when his knees gave out.  Pt on coumadin, no longer taking Plavix.  Pt also had neck surgery on June 26th and has had BL finger and toe tingling since.  BP-131/71 P-80 NSR CBG-218. EMS reports a x 4, NAD, reports last episode approximately 2 hours ago.  18 g LAC.  EMS reports neuro exam -.  Family at bedside.

## 2016-06-10 NOTE — Discharge Instructions (Signed)
Syncope Syncope is a medical term for fainting or passing out. This means you lose consciousness and drop to the ground. People are generally unconscious for less than 5 minutes. You may have some muscle twitches for up to 15 seconds before waking up and returning to normal. Syncope occurs more often in older adults, but it can happen to anyone. While most causes of syncope are not dangerous, syncope can be a sign of a serious medical problem. It is important to seek medical care.  CAUSES  Syncope is caused by a sudden drop in blood flow to the brain. The specific cause is often not determined. Factors that can bring on syncope include:  Taking medicines that lower blood pressure.  Sudden changes in posture, such as standing up quickly.  Taking more medicine than prescribed.  Standing in one place for too long.  Seizure disorders.  Dehydration and excessive exposure to heat.  Low blood sugar (hypoglycemia).  Straining to have a bowel movement.  Heart disease, irregular heartbeat, or other circulatory problems.  Fear, emotional distress, seeing blood, or severe pain. SYMPTOMS  Right before fainting, you may:  Feel dizzy or light-headed.  Feel nauseous.  See all white or all black in your field of vision.  Have cold, clammy skin. DIAGNOSIS  Your health care provider will ask about your symptoms, perform a physical exam, and perform an electrocardiogram (ECG) to record the electrical activity of your heart. Your health care provider may also perform other heart or blood tests to determine the cause of your syncope which may include:  Transthoracic echocardiogram (TTE). During echocardiography, sound waves are used to evaluate how blood flows through your heart.  Transesophageal echocardiogram (TEE).  Cardiac monitoring. This allows your health care provider to monitor your heart rate and rhythm in real time.  Holter monitor. This is a portable device that records your  heartbeat and can help diagnose heart arrhythmias. It allows your health care provider to track your heart activity for several days, if needed.  Stress tests by exercise or by giving medicine that makes the heart beat faster. TREATMENT  In most cases, no treatment is needed. Depending on the cause of your syncope, your health care provider may recommend changing or stopping some of your medicines. HOME CARE INSTRUCTIONS  Have someone stay with you until you feel stable.  Do not drive, use machinery, or play sports until your health care provider says it is okay.  Keep all follow-up appointments as directed by your health care provider.  Lie down right away if you start feeling like you might faint. Breathe deeply and steadily. Wait until all the symptoms have passed.  Drink enough fluids to keep your urine clear or pale yellow.  If you are taking blood pressure or heart medicine, get up slowly and take several minutes to sit and then stand. This can reduce dizziness. SEEK IMMEDIATE MEDICAL CARE IF:   You have a severe headache.  You have unusual pain in the chest, abdomen, or back.  You are bleeding from your mouth or rectum, or you have black or tarry stool.  You have an irregular or very fast heartbeat.  You have pain with breathing.  You have repeated fainting or seizure-like jerking during an episode.  You faint when sitting or lying down.  You have confusion.  You have trouble walking.  You have severe weakness.  You have vision problems. If you fainted, call your local emergency services (911 in [place] Do not drive   yourself to the hospital.    This information is not intended to replace advice given to you by your health care provider. Make sure you discuss any questions you have with your health care provider.   Document Released: 11/22/2005 Document Revised: 04/08/2015 Document Reviewed: 01/21/2012 Elsevier Interactive Patient Education 2016 [employer] Inc[employer]  

## 2016-06-10 NOTE — Telephone Encounter (Signed)
**Note De-Identified  Obfuscation** The pts wife is advised to call 911 for transport of the pt to the closest ER. She verbalized understanding and is in agreement with plan.

## 2016-06-10 NOTE — Telephone Encounter (Signed)
New message   Pt wife is really crying hard on the call, she verbalizing that she cant keep picking him up off the floor  he fell at 2:30pm today he is not steady on his feet, when he use his walking he use the walker but he walks into the walls   Pt has been talking out of his mind "saying that she will be here in a minute" like he has alzheimer's  She said that she cant drive him and if the Dr.Klein or Dr.Varanasi Thinks he needs to go to the hospital he needs to go by EMS  Please call pt wife, she said that he dont want her to call but she is scared

## 2016-06-11 ENCOUNTER — Telehealth (HOSPITAL_COMMUNITY): Payer: Self-pay | Admitting: Nurse Practitioner

## 2016-06-11 ENCOUNTER — Ambulatory Visit: Payer: Commercial Managed Care - HMO | Admitting: Family Medicine

## 2016-06-11 NOTE — Telephone Encounter (Signed)
Pt had recently been started on coumdain due to afib on monitor and high chadsvasc score but had spell yesterday where he had syncope with  strike to  the head. Head scan did not show bleed. INR sub therapeutic around 1. Discussed with Dr. Caryl Comes who feels we should stop coumadin for now, can restart plavix until this can be further sorted out. He will contact the device clinic and see if any arrhythmia on linq to explain symptoms. I tried to contact pt but no answer, left message to stop coumadin, restart plavix and to call to confirm instructions. Will see in f/u next week.

## 2016-06-11 NOTE — Telephone Encounter (Signed)
7/7 at 2:50 pm Was able to contact wife and states her husband is doing better today and is more steady on his feet. No falls. Told her that I discussed with Dr. Caryl Comes and until we can better understand falls, stop coumadin and restart plavix. BP was ok in er yesterday but chance it may be making pt have orthostatic hypotension. She said she would check BP later today and if under 100 sys, hold toprol until f/u.He is also taking pain meds for recent neck surgery, may be contributing. He had syncope after he got up at night to urinate and fell to floor upon starting to walk. Also advised to dangle at side of bed for a few minutes before he gets up to walk. I will see him back on Thursday in the clinic at 2 pm. He does see his PCP on Monday. He just recently took off monitor after wearing x 30 days. He may need a LInq.He was in SR yesterday in ER.

## 2016-06-14 ENCOUNTER — Ambulatory Visit: Payer: Commercial Managed Care - HMO | Admitting: Family Medicine

## 2016-06-15 ENCOUNTER — Telehealth: Payer: Self-pay | Admitting: Family Medicine

## 2016-06-15 MED FILL — ACCU-CHEK AVIVA PLUS TEST S: 20 days supply | Qty: 100 | Fill #3

## 2016-06-15 NOTE — Telephone Encounter (Signed)
Patient was No Show for Follow up appointment July 10. Charge or No Charge?

## 2016-06-15 NOTE — Telephone Encounter (Signed)
No charge. 

## 2016-06-16 ENCOUNTER — Encounter: Payer: Self-pay | Admitting: Family Medicine

## 2016-06-17 ENCOUNTER — Ambulatory Visit (HOSPITAL_COMMUNITY): Payer: Commercial Managed Care - HMO | Admitting: Nurse Practitioner

## 2016-06-17 ENCOUNTER — Telehealth (HOSPITAL_COMMUNITY): Payer: Self-pay | Admitting: *Deleted

## 2016-06-17 NOTE — Telephone Encounter (Signed)
Pts wife called to let us know that pt is seeing neurosurgeon who feels it is not a good idea for the patient to be on blood thinners right now.  Pt is due to see surgeon back in follow up in 3 weeks and they will make decision at that time as to whether the pt should start blood thinner.  They will call us to make appt to discuss blood thinner and anti-arrhythmic after speaking with the surgeon.

## 2016-06-22 ENCOUNTER — Ambulatory Visit (INDEPENDENT_AMBULATORY_CARE_PROVIDER_SITE_OTHER): Payer: Commercial Managed Care - HMO | Admitting: Interventional Cardiology

## 2016-06-22 ENCOUNTER — Encounter: Payer: Self-pay | Admitting: Interventional Cardiology

## 2016-06-22 VITALS — BP 120/70 | HR 80 | Ht 69.0 in | Wt 178.0 lb

## 2016-06-22 DIAGNOSIS — E785 Hyperlipidemia, unspecified: Secondary | ICD-10-CM | POA: Diagnosis not present

## 2016-06-22 DIAGNOSIS — E1159 Type 2 diabetes mellitus with other circulatory complications: Secondary | ICD-10-CM | POA: Diagnosis not present

## 2016-06-22 DIAGNOSIS — I25118 Atherosclerotic heart disease of native coronary artery with other forms of angina pectoris: Secondary | ICD-10-CM | POA: Diagnosis not present

## 2016-06-22 DIAGNOSIS — I480 Paroxysmal atrial fibrillation: Secondary | ICD-10-CM

## 2016-06-22 MED ORDER — METOPROLOL SUCCINATE ER 50 MG PO TB24: 50.0000 mg | ORAL_TABLET | Freq: Every day | ORAL | Status: DC

## 2016-06-22 NOTE — Patient Instructions (Addendum)
**Note De-Identified  Obfuscation** Medication Instructions:  Increase Metoprolol to 50 mg daily-all other medications remain the same.  Labwork: None  Testing/Procedures: None  Follow-Up: Your physician recommends that you schedule a follow-up appointment in: 6 weeks with App   Any Other Special Instructions Will Be Listed Below (If Applicable). Please let us know when you Neurologist  Says its ok for you to start Coumadin.   If you need a refill on your cardiac medications before your next appointment, please call your pharmacy.
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
## 2016-06-24 ENCOUNTER — Ambulatory Visit (INDEPENDENT_AMBULATORY_CARE_PROVIDER_SITE_OTHER): Payer: Commercial Managed Care - HMO | Admitting: Gastroenterology

## 2016-06-24 ENCOUNTER — Other Ambulatory Visit (INDEPENDENT_AMBULATORY_CARE_PROVIDER_SITE_OTHER): Payer: Commercial Managed Care - HMO

## 2016-06-24 ENCOUNTER — Encounter: Payer: Self-pay | Admitting: Gastroenterology

## 2016-06-24 VITALS — BP 100/60 | HR 68 | Ht 68.0 in | Wt 177.5 lb

## 2016-06-24 DIAGNOSIS — R197 Diarrhea, unspecified: Secondary | ICD-10-CM

## 2016-06-24 LAB — IGA: IGA: 94 mg/dL (ref 68–378)

## 2016-06-24 NOTE — Telephone Encounter (Signed)
Completed.

## 2016-06-24 NOTE — Progress Notes (Signed)
History of Present Illness: This is a 70 year old male referred by [name], MD for the evaluation of postprandial diarrhea. He is accompanied by his wife. Symptoms have been bothersome for about one year. Symptoms happened was all types of foods. Colonoscopy was performed by Dr. Sharlett Iles in March 2010 showing internal hemorrhoids, otherwise normal. He had a prior history of adenomatous colon polyps and a five-year interval colonoscopy was recommended at that time. Patient has been maintained on metformin for many years. Denies recent antibiotic usage. Denies weight loss, abdominal pain, constipation, change in stool caliber, melena, hematochezia, nausea, vomiting, dysphagia, reflux symptoms, chest pain.   Allergies  Allergen Reactions  . Morphine Other (See Comments)    hallucinations  . Shellfish-Derived Products Itching   Outpatient Prescriptions Prior to Visit  Medication Sig Dispense Refill  . atorvastatin (LIPITOR) 40 MG tablet TAKE 1 TABLET BY MOUTH DAILY AT 6PM 30 tablet 5  . baclofen (LIORESAL) 10 MG tablet Take 0.5 tablets (5 mg total) by mouth at bedtime. 30 each 0  . clopidogrel (PLAVIX) 75 MG tablet Take 1 tablet (75 mg total) by mouth daily. 30 tablet 3  . clotrimazole-betamethasone (LOTRISONE) cream Apply 1 application topically 2 (two) times daily.    . cyclobenzaprine (FLEXERIL) 10 MG tablet Take 1 tablet (10 mg total) by mouth 3 (three) times daily as needed for muscle spasms. 30 tablet 0  . diclofenac sodium (VOLTAREN) 1 % GEL Apply 2 g topically daily as needed (for pain in affected area).    . gabapentin (NEURONTIN) 300 MG capsule Take 2 capsules (600 mg) by mouth  every morning, 1 capsule (300 mg) by mouth  at noon, and 2 capsules (600 mg) by mouth at night    . glucose blood (ACCU-CHEK AVIVA) test strip Use as directed to check blood sugar 4 times daily.  DX E11.9 200 each 6  . HYDROcodone-acetaminophen (NORCO/VICODIN) 5-325 MG tablet Take 1-2 tablets by mouth  every 4 (four) hours as needed (mild pain). 80 tablet 0  . insulin aspart (NOVOLOG) 100 UNIT/ML injection Inject 10-17 Units into the skin 3 (three) times daily with meals. Use 10-17 unit 3 times daily before meals 15 mL 4  . Insulin Glargine (TOUJEO SOLOSTAR) 300 UNIT/ML SOPN Inject 42 Units into the skin at bedtime. 3 pen 4  . isosorbide mononitrate (IMDUR) 120 MG 24 hr tablet Take 120 mg by mouth daily.    Marland Kitchen levothyroxine (SYNTHROID, LEVOTHROID) 75 MCG tablet Take 1 tablet (75 mcg total) by mouth daily. 90 tablet 2  . metFORMIN (GLUCOPHAGE) 1000 MG tablet TAKE 1 TABLET (1,000 MG TOTAL) BY MOUTH 2 (TWO) TIMES DAILY WITH A MEAL. 60 tablet 5  . metoprolol succinate (TOPROL-XL) 50 MG 24 hr tablet Take 1 tablet (50 mg total) by mouth daily. 30 tablet 3  . midodrine (PROAMATINE) 10 MG tablet Take 5 mg by mouth 3 (three) times daily.   0  . nitroGLYCERIN (NITROSTAT) 0.4 MG SL tablet Place 0.4 mg under the tongue every 5 (five) minutes as needed for chest pain.    . nitroGLYCERIN (NITROSTAT) 0.4 MG SL tablet Place 0.4 mg under the tongue every 5 (five) minutes as needed for chest pain (X 3 DOSES FOR CHEST PAIN).    Marland Kitchen ondansetron (ZOFRAN-ODT) 4 MG disintegrating tablet Take 1 tablet (4 mg total) by mouth every 8 (eight) hours as needed for nausea or vomiting. 20 tablet 0  . pantoprazole (PROTONIX) 40 MG tablet Take 40 mg by mouth  daily.    . pramipexole (MIRAPEX) 1 MG tablet Take 1 mg by mouth at bedtime.    . ranolazine (RANEXA) 500 MG 12 hr tablet Take 500 mg by mouth 2 (two) times daily.    . sertraline (ZOLOFT) 100 MG tablet Take 1 tablet (100 mg total) by mouth 2 (two) times daily. 180 tablet 1  . tiZANidine (ZANAFLEX) 4 MG tablet Take 1 tablet (4 mg total) by mouth every 8 (eight) hours as needed for muscle spasms. 90 tablet 2  . topiramate (TOPAMAX) 50 MG tablet Take 1 tablet (50 mg total) by mouth at bedtime. 30 tablet 3  . warfarin (COUMADIN) 5 MG tablet Take 1 tablet (5 mg total) by mouth daily.  (Patient not taking: Reported on 06/22/2016) 30 tablet 3   No facility-administered medications prior to visit.   Past Medical History  Diagnosis Date  . Diabetes mellitus type II, uncontrolled ([place])   . OSA (obstructive sleep apnea)   . Hyperlipidemia   . RBBB (right bundle branch block)   . Depression     Bipolar  . Edema   . Falling episodes     these have occurred in the past and again recurring 2011  . Spondylosis     C5-6, C6-7 MRI 2010  . CAD (coronary artery disease)     A. CABG in 2000,status post cardiac cath in 2006, 2009 ....continued chest pain and SOB despite oral medication adjestments including Ranexa. B. Cath November 2009/ mRCA - 2.75 x 23 Abbott Xience V drug-eluting stent ...11/26/2008 to distal  RCA leading to acute marginal.  C. Cath 07/2012 for CP - stable anatomy, med rx. d. cath 2015 and 05/30/2015 stable anatomy, consider Myoview if has CP again  . Hx of CABG     2000,  / one median sternotomy suture broken her chest x-ray November, 2010, no clinical significance  . Palpitations     event recorder showed sinus rhythm  . Nephrolithiasis   . Restless leg   . Dizziness   . Shortness of breath     CPX April, 2011, mild functional limitation, no clear pulmonary or cardiac limitation, possible deconditioning and mild chronotropic incompetence( peak heart rate 130)  . Cerebral ischemia     MRI November, 2010, chronic microvascular ischemia  . H/O medication noncompliance     Due to loss of insurance  . Ejection fraction     EF 60%, echo, July 31, 2012  . Diabetes mellitus without complication ([place])   . Pain in limb 06/12/2009    Qualifier: Diagnosis of  By: [name]   . Low back pain 06/12/2009    Qualifier: Diagnosis of  By: [name]   . Thyroid disease   . Right knee pain 01/07/2015  . Advance care planning 05/01/2015  . ACP (advance care planning) 05/11/2015  . Nausea with vomiting 01/25/2016  . Headache 01/25/2016  . Diarrhea 02/15/2016  .  Gastric ulcer   . Tubulovillous adenoma of colon 2007  . Syncope 03/2016  . Chronic diastolic CHF (congestive heart failure) ([place]) 03/13/2016  . Family history of adverse reaction to anesthesia     "mother died during bypass surgery but not sure if it has to do with anesthesia"  . Hypertension     pt. denies  . Anemia     hemoglobin 7.4, iron deficiency, January, 2011, 2 unit transfusion, endoscopy normal, capsule endoscopy February, 2011 no small bowel abnormalities.   Most likely source gastric  erosions, followed by GI  . Iron deficiency anemia   . Wears glasses   . Anxiety   . GERD (gastroesophageal reflux disease)   . Hard of hearing    Past Surgical History  Procedure Laterality Date  . Nasal septum surgery      UP3  . Coronary artery bypass graft      2000  . Left heart catheterization with coronary/graft angiogram N/A 08/01/2012    Procedure: LEFT HEART CATHETERIZATION WITH Beatrix Fetters;  Surgeon: [name], MD;  [facility] CATH LAB;  Service: Cardiovascular;  Laterality: N/A;  . Left heart catheterization with coronary/graft angiogram N/A 01/03/2015    Procedure: LEFT HEART CATHETERIZATION WITH Beatrix Fetters;  Surgeon: [name], MD;  [facility] C[facility] LAB;  Service: Cardiovascular;  Laterality: N/A;  . Percutaneous coronary stent intervention (pci-s)  10/2008    mRCA PCI  2.75 x 23 Abbott Xience V drug-eluting stent   . Cardiac catheterization N/A 05/30/2015    Procedure: Left Heart Cath and Coronary Angiography;  Surgeon: [name], MD;  Location: [facility] CV LAB;  Service: Cardiovascular;  Laterality: N/A;  . Colonoscopy    . Esophagogastroduodenoscopy    . Anterior cervical decomp/discectomy fusion N/A 05/31/2016    Procedure: ANTERIOR CERVICAL DECOMPRESSION/DISCECTOMY FUSION CERVICAL FIVE-SIX,CERVICAL SIX-SEVEN;  Surgeon: [name], MD;  Location: [facility] NEURO ORS;  Service: Neurosurgery;  Laterality: N/A;   Social History    Social History  . Marital Status: Married    Spouse Name: N/A  . Number of Children: 4  . Years of Education: 13   Occupational History  . retired    Social History Main Topics  . Smoking status: Never Smoker   . Smokeless tobacco: Never Used  . Alcohol Use: No     Comment: stopped drinking in 1998  . Drug Use: No  . Sexual Activity:    Partners: Female   Other Topics Concern  . None   Social History Narrative   Patient is right handed.   Patient does not drink caffeine.   Family History  Problem Relation Age of Onset  . Pancreatic cancer Brother   . Diabetes Brother     4   . Stroke Brother   . Cancer Brother   . Coronary artery disease Brother   . Hypothyroidism Brother   . Heart attack      Nephew  . Diabetes Mother   . Heart failure Mother   . Heart failure Father   . Irregular heart beat Daughter   . Cancer Maternal Grandmother     unknown   . Other Brother     colon surgery     Review of Systems: Pertinent positive and negative review of systems were noted in the above HPI section. All other review of systems were otherwise negative.   Physical Exam: General: Well developed, well nourished, no acute distress Head: Normocephalic and atraumatic Eyes:  sclerae anicteric, EOMI Ears: Normal auditory acuity Mouth: No deformity or lesions Neck: Supple, no masses or thyromegaly Lungs: Clear throughout to auscultation Heart: Regular rate and rhythm; no murmurs, rubs or bruits Abdomen: Soft, non tender and non distended. No masses, hepatosplenomegaly or hernias noted. Normal Bowel sounds Musculoskeletal: Symmetrical with no gross deformities  Skin: No lesions on visible extremities Pulses:  Normal pulses noted Extremities: No clubbing, cyanosis, edema or deformities noted Neurological: Alert oriented x 4, grossly nonfocal Cervical Nodes:  No significant cervical adenopathy Inguinal Nodes: No significant inguinal adenopathy Psychological:  Alert and  cooperative. Normal mood and affect  Assessment and Recommendations:  1. Diarrhea, post prandial. R/O bacterial overgrowth, metformin side effect, celiac disease. Obtain GI pathogen panel, tTG and IgA. If these are unremarkable I asked him to discuss with Dr. Charlett Blake temporarily discontinuing metformin to access for medication side effect. IF metformin is not the cause will plan for a 2 week course of Xifaxan for possible bacterial overgrowth. Return office visit 2 months.    2. Personal history of adenomatous colon polyp. He is recovering from anterior cervical decompression discectomy surgery performed in June. He also has atrial fibrillation and was recently started on Coumadin. Current guidelines call for a 5-10 year interval colonoscopy in his situation and given his comorbidities, recent surgery, afib and Coumadin use we will defer surveillance colonoscopy at this time.  cc: [name], MD [address] [place], [place] [address]

## 2016-06-24 NOTE — Patient Instructions (Signed)
Your physician has requested that you go to the basement for the following lab work before leaving today: TTG, IGA and GI pathogen panel.  Thank you for choosing me and [facility].  Pricilla Riffle. Dagoberto Ligas., MD., [facility] Regal

## 2016-06-25 LAB — TISSUE TRANSGLUTAMINASE, IGA: Tissue Transglutaminase Ab, IgA: 1 U/mL (ref <4)

## 2016-06-29 ENCOUNTER — Encounter (HOSPITAL_BASED_OUTPATIENT_CLINIC_OR_DEPARTMENT_OTHER): Payer: Commercial Managed Care - HMO

## 2016-06-30 MED FILL — CYCLOBENZAPRINE 10 MG TAB: 10 | 10 days supply | Qty: 30 | Fill #0

## 2016-06-30 MED FILL — HYDROCODON-APAP 5-325: 5-325 | 5 days supply | Qty: 60 | Fill #0

## 2016-06-30 MED FILL — METOPROLOL SUCC ER 50 MG TA: 50 | 30 days supply | Qty: 30 | Fill #0

## 2016-07-01 ENCOUNTER — Ambulatory Visit: Payer: Commercial Managed Care - HMO | Admitting: Family Medicine

## 2016-07-12 ENCOUNTER — Ambulatory Visit (INDEPENDENT_AMBULATORY_CARE_PROVIDER_SITE_OTHER): Payer: Commercial Managed Care - HMO | Admitting: Neurology

## 2016-07-12 ENCOUNTER — Encounter: Payer: Self-pay | Admitting: Neurology

## 2016-07-12 VITALS — BP 124/66 | HR 69 | Ht 68.0 in | Wt 173.4 lb

## 2016-07-12 DIAGNOSIS — R51 Headache: Secondary | ICD-10-CM | POA: Diagnosis not present

## 2016-07-12 DIAGNOSIS — R55 Syncope and collapse: Secondary | ICD-10-CM

## 2016-07-12 DIAGNOSIS — M4802 Spinal stenosis, cervical region: Secondary | ICD-10-CM

## 2016-07-12 DIAGNOSIS — G4486 Cervicogenic headache: Secondary | ICD-10-CM

## 2016-07-12 NOTE — Patient Instructions (Signed)
Follow up in 3 months

## 2016-07-12 NOTE — Progress Notes (Signed)
NEUROLOGY FOLLOW UP OFFICE NOTE  [name] [name] RL:[id number]  HISTORY OF PRESENT ILLNESS: [name] [name] is a 70 year old right-handed man with type 2 diabetes mellitus, OSA, hyperlipidemia, coronary artery disease, and hypertension and cervicogenic headache and occipital neuralgia who now follows up for falls and confusion.  History obtained by patient and wife.  UPDATE: 72 hour ambulatory EEG from 04/05/16 to 04/08/16 was normal.  He did exhibit episodes of palpitations and dizziness without electrographic correlate but did not have any syncopal events.  He had a 48 hour Holter monitor which revealed NSR with occasional PVCs, but no pathologic arrhythmias.  He was then hooked up to an event monitor which demonstrated atrial fibrillation and flutter.  He was started on Coumadin in addition to Plavix.  He continues to have episodes of syncope and falls, requiring another ED visit on 06/10/16.  Due to persistent occipital headaches and worsening arm weakness, he had a repeat MRI of cervical spine on 05/01/16 which was personally reviewed and revealed multilevel foraminal stenosis at C3-4, C4-5, C5-6 and C6-7.  He underwent anterior cervical decompression and fusion in June.  Headaches have improved but he started having increased headaches over the past week due to stress related to moving in with his daughters.  He has dizzy spells daily.  He seems to get confused maybe once a week.  HISTORY: He has been experiencing episodes of syncope and confusion.  He will suddenly feel lightheaded, diaphoretic, experience palpitations and then tunnel vision.  He will then pass out.  He doesn't actually remember falling.  He is unconscious very briefly but he appears to be in a deep sleep.  There are no convulsions, bowel or bladder incontinence or tongue biting.  When he wakes up, he is confused and slurs his speech from 30 to 60 minutes.  If he is up afterwards, he is unsteady on his feet.  It happens at rest but  is much worse if he stands up.  This can occur almost daily.  In addition, he appears to have memory issues.  He often misplaces items such as his wallet.  He will put down one cup in the kitchen and then grab for another cup, forgetting that he already had one.  He is not acting like himself.  He seems to be having more vivid dreams.  He will wake up from sleep, still acting out from his dream.  One night, he woke up calling for his deceased father and brother.  Another time, he woke up in the middle of the night when his wife got up to go use the restroom.  He sat up confused and asked where he was and where home is.   He has had multiple hospital visits with extensive workup.  Blood pressure was noted to be as low as 70s/40s with pulse dropping into the 60s.  MRI of brain from 03/09/16 was unremarkable for acute abnormality.  CTA of head and neck was unremarkable.  Echo showed EF 55-60% with grade 1 diastolic dysfunction.  Routine EEG was normal.  Telemetry revealed no arrhythmias.  He was noted to be bradycardic with HR in the 50s, so his beta blocker dose was reduced.  Diagnosis was unclear but his ASA was switched to Plavix.   He underwent long term EEG monitoring for 24 hours, which was normal but no event was captured.  Repeat routine EEG on 03/18/16 was also normal.    PAST MEDICAL HISTORY: Past Medical History:  Diagnosis  Date  . ACP (advance care planning) 05/11/2015  . Advance care planning 05/01/2015  . Anemia    hemoglobin 7.4, iron deficiency, January, 2011, 2 unit transfusion, endoscopy normal, capsule endoscopy February, 2011 no small bowel abnormalities.   Most likely source gastric erosions, followed by GI  . Anxiety   . CAD (coronary artery disease)    A. CABG in 2000,status post cardiac cath in 2006, 2009 ....continued chest pain and SOB despite oral medication adjestments including Ranexa. B. Cath November 2009/ mRCA - 2.75 x 23 Abbott Xience V drug-eluting stent ...11/26/2008 to distal   RCA leading to acute marginal.  C. Cath 07/2012 for CP - stable anatomy, med rx. d. cath 2015 and 05/30/2015 stable anatomy, consider Myoview if has CP again  . Cerebral ischemia    MRI November, 2010, chronic microvascular ischemia  . Chronic diastolic CHF (congestive heart failure) ([place]) 03/13/2016  . Depression    Bipolar  . Diabetes mellitus type II, uncontrolled ([place])   . Diabetes mellitus without complication ([place])   . Diarrhea 02/15/2016  . Dizziness   . Edema   . Ejection fraction    EF 60%, echo, July 31, 2012  . Falling episodes    these have occurred in the past and again recurring 2011  . Family history of adverse reaction to anesthesia    "mother died during bypass surgery but not sure if it has to do with anesthesia"  . Gastric ulcer   . GERD (gastroesophageal reflux disease)   . H/O medication noncompliance    Due to loss of insurance  . Hard of hearing   . Headache 01/25/2016  . Hx of CABG    2000,  / one median sternotomy suture broken her chest x-ray November, 2010, no clinical significance  . Hyperlipidemia   . Hypertension    pt. denies  . Iron deficiency anemia   . Low back pain 06/12/2009   Qualifier: Diagnosis of  By: [name]   . Nausea with vomiting 01/25/2016  . Nephrolithiasis   . OSA (obstructive sleep apnea)   . Pain in limb 06/12/2009   Qualifier: Diagnosis of  By: [name]   . Palpitations    event recorder showed sinus rhythm  . RBBB (right bundle branch block)   . Restless leg   . Right knee pain 01/07/2015  . Shortness of breath    CPX April, 2011, mild functional limitation, no clear pulmonary or cardiac limitation, possible deconditioning and mild chronotropic incompetence( peak heart rate 130)  . Spondylosis    C5-6, C6-7 MRI 2010  . Syncope 03/2016  . Thyroid disease   . Tubulovillous adenoma of colon 2007  . Wears glasses     MEDICATIONS: Current Outpatient Prescriptions on File Prior to Visit  Medication Sig Dispense  Refill  . atorvastatin (LIPITOR) 40 MG tablet TAKE 1 TABLET BY MOUTH DAILY AT 6PM 30 tablet 5  . baclofen (LIORESAL) 10 MG tablet Take 0.5 tablets (5 mg total) by mouth at bedtime. 30 each 0  . clopidogrel (PLAVIX) 75 MG tablet Take 1 tablet (75 mg total) by mouth daily. 30 tablet 3  . clotrimazole-betamethasone (LOTRISONE) cream Apply 1 application topically 2 (two) times daily.    . cyclobenzaprine (FLEXERIL) 10 MG tablet Take 1 tablet (10 mg total) by mouth 3 (three) times daily as needed for muscle spasms. 30 tablet 0  . diclofenac sodium (VOLTAREN) 1 % GEL Apply 2 g topically daily as needed (  for pain in affected area).    . gabapentin (NEURONTIN) 300 MG capsule Take 2 capsules (600 mg) by mouth  every morning, 1 capsule (300 mg) by mouth  at noon, and 2 capsules (600 mg) by mouth at night    . glucose blood (ACCU-CHEK AVIVA) test strip Use as directed to check blood sugar 4 times daily.  DX E11.9 200 each 6  . HYDROcodone-acetaminophen (NORCO/VICODIN) 5-325 MG tablet Take 1-2 tablets by mouth every 4 (four) hours as needed (mild pain). 80 tablet 0  . insulin aspart (NOVOLOG) 100 UNIT/ML injection Inject 10-17 Units into the skin 3 (three) times daily with meals. Use 10-17 unit 3 times daily before meals 15 mL 4  . Insulin Glargine (TOUJEO SOLOSTAR) 300 UNIT/ML SOPN Inject 42 Units into the skin at bedtime. 3 pen 4  . isosorbide mononitrate (IMDUR) 120 MG 24 hr tablet Take 120 mg by mouth daily.    Marland Kitchen levothyroxine (SYNTHROID, LEVOTHROID) 75 MCG tablet Take 1 tablet (75 mcg total) by mouth daily. 90 tablet 2  . metFORMIN (GLUCOPHAGE) 1000 MG tablet TAKE 1 TABLET (1,000 MG TOTAL) BY MOUTH 2 (TWO) TIMES DAILY WITH A MEAL. 60 tablet 5  . metoprolol succinate (TOPROL-XL) 50 MG 24 hr tablet Take 1 tablet (50 mg total) by mouth daily. 30 tablet 3  . midodrine (PROAMATINE) 10 MG tablet Take 5 mg by mouth 3 (three) times daily.   0  . nitroGLYCERIN (NITROSTAT) 0.4 MG SL tablet Place 0.4 mg under the  tongue every 5 (five) minutes as needed for chest pain.    . nitroGLYCERIN (NITROSTAT) 0.4 MG SL tablet Place 0.4 mg under the tongue every 5 (five) minutes as needed for chest pain (X 3 DOSES FOR CHEST PAIN).    Marland Kitchen ondansetron (ZOFRAN-ODT) 4 MG disintegrating tablet Take 1 tablet (4 mg total) by mouth every 8 (eight) hours as needed for nausea or vomiting. 20 tablet 0  . pantoprazole (PROTONIX) 40 MG tablet Take 40 mg by mouth daily.    . pramipexole (MIRAPEX) 1 MG tablet Take 1 mg by mouth at bedtime.    . ranolazine (RANEXA) 500 MG 12 hr tablet Take 500 mg by mouth 2 (two) times daily.    . sertraline (ZOLOFT) 100 MG tablet Take 1 tablet (100 mg total) by mouth 2 (two) times daily. 180 tablet 1  . tiZANidine (ZANAFLEX) 4 MG tablet Take 1 tablet (4 mg total) by mouth every 8 (eight) hours as needed for muscle spasms. 90 tablet 2  . topiramate (TOPAMAX) 50 MG tablet Take 1 tablet (50 mg total) by mouth at bedtime. 30 tablet 3  . warfarin (COUMADIN) 5 MG tablet Take 1 tablet (5 mg total) by mouth daily. 30 tablet 3   No current facility-administered medications on file prior to visit.     ALLERGIES: Allergies  Allergen Reactions  . Morphine Other (See Comments)    hallucinations  . Shellfish-Derived Products Itching    FAMILY HISTORY: Family History  Problem Relation Age of Onset  . Pancreatic cancer Brother   . Diabetes Brother     4   . Stroke Brother   . Diabetes Mother   . Heart failure Mother   . Heart failure Father   . Cancer Brother   . Coronary artery disease Brother   . Hypothyroidism Brother   . Heart attack      Nephew  . Irregular heart beat Daughter   . Cancer Maternal Grandmother  unknown   . Other Brother     colon surgery    SOCIAL HISTORY: Social History   Social History  . Marital status: Married    Spouse name: N/A  . Number of children: 4  . Years of education: 46   Occupational History  . retired    Social History Main Topics  . Smoking  status: Never Smoker  . Smokeless tobacco: Never Used  . Alcohol use No     Comment: stopped drinking in 1998  . Drug use: No  . Sexual activity: Yes    Partners: Female   Other Topics Concern  . Not on file   Social History Narrative   Patient is right handed.   Patient does not drink caffeine.    REVIEW OF SYSTEMS: Constitutional: No fevers, chills, or sweats, no generalized fatigue, change in appetite Eyes: No visual changes, double vision, eye pain Ear, nose and throat: No hearing loss, ear pain, nasal congestion, sore throat Cardiovascular: No chest pain, palpitations Respiratory:  No shortness of breath at rest or with exertion, wheezes GastrointestinaI: No nausea, vomiting, diarrhea, abdominal pain, fecal incontinence Genitourinary:  No dysuria, urinary retention or frequency Musculoskeletal:  No neck pain, back pain Integumentary: No rash, pruritus, skin lesions Neurological: as above Psychiatric: No depression, insomnia, anxiety Endocrine: No palpitations, fatigue, diaphoresis, mood swings, change in appetite, change in weight, increased thirst Hematologic/Lymphatic:  No purpura, petechiae. Allergic/Immunologic: no itchy/runny eyes, nasal congestion, recent allergic reactions, rashes  PHYSICAL EXAM: Vitals:   07/12/16 1120  BP: 124/66  Pulse: 69   General: No acute distress.  Patient appears well-groomed.  normal body habitus. Head:  Normocephalic/atraumatic Eyes:  Fundi examined but not visualized Neck: supple, no paraspinal tenderness, full range of motion Heart:  Regular rate and rhythm Lungs:  Clear to auscultation bilaterally Back: No paraspinal tenderness Neurological Exam: alert and oriented to person, place, and time. Attention span and concentration intact, recent and remote memory intact, fund of knowledge intact.  Speech fluent and not dysarthric, language intact.  CN II-XII intact. Bulk and tone normal, muscle strength 5/5 throughout.  Sensation to  light touch, temperature and vibration intact.  Deep tendon reflexes 2+ throughout.  Finger to nose and heel to shin testing intact.  Gait normal, Romberg with sway.  IMPRESSION: Syncope and near-syncope.  I don't suspect his spells are seizures.  It sounds either cardiac or some type of dysautonomia.  He has had hypotensive episodes as well.  Not sure if episodic confusion may be related to episodes of hypotension. Cervicogenic headache  PLAN: 1.  Continue gabapentin  2.  I would allow neck to heal for now.  Return in 3 months.  If headache persists, will discuss adjustment in medication  15 minutes spent face to face with patient, over 50% spent counseling.  Metta Clines, DO  CC:  Penni Homans, MD

## 2016-07-20 ENCOUNTER — Encounter (INDEPENDENT_AMBULATORY_CARE_PROVIDER_SITE_OTHER): Payer: Self-pay

## 2016-07-20 ENCOUNTER — Ambulatory Visit (INDEPENDENT_AMBULATORY_CARE_PROVIDER_SITE_OTHER): Payer: Commercial Managed Care - HMO

## 2016-07-20 DIAGNOSIS — Z7901 Long term (current) use of anticoagulants: Secondary | ICD-10-CM | POA: Insufficient documentation

## 2016-07-20 DIAGNOSIS — Z5181 Encounter for therapeutic drug level monitoring: Secondary | ICD-10-CM | POA: Diagnosis not present

## 2016-07-20 DIAGNOSIS — I4891 Unspecified atrial fibrillation: Secondary | ICD-10-CM | POA: Insufficient documentation

## 2016-07-20 DIAGNOSIS — I480 Paroxysmal atrial fibrillation: Secondary | ICD-10-CM | POA: Diagnosis not present

## 2016-07-20 HISTORY — DX: Long term (current) use of anticoagulants: Z79.01

## 2016-07-20 LAB — POCT INR: INR: 1.6

## 2016-07-20 NOTE — Patient Instructions (Signed)

## 2016-07-22 ENCOUNTER — Encounter: Payer: Self-pay | Admitting: Cardiology

## 2016-07-27 ENCOUNTER — Ambulatory Visit (INDEPENDENT_AMBULATORY_CARE_PROVIDER_SITE_OTHER): Payer: Commercial Managed Care - HMO | Admitting: *Deleted

## 2016-07-27 DIAGNOSIS — Z5181 Encounter for therapeutic drug level monitoring: Secondary | ICD-10-CM

## 2016-07-27 DIAGNOSIS — Z7901 Long term (current) use of anticoagulants: Secondary | ICD-10-CM

## 2016-07-27 DIAGNOSIS — I480 Paroxysmal atrial fibrillation: Secondary | ICD-10-CM | POA: Diagnosis not present

## 2016-07-27 LAB — POCT INR: INR: 3.2

## 2016-07-29 ENCOUNTER — Encounter: Payer: Self-pay | Admitting: Cardiology

## 2016-08-02 ENCOUNTER — Other Ambulatory Visit: Payer: Self-pay | Admitting: Neurology

## 2016-08-02 ENCOUNTER — Other Ambulatory Visit: Payer: Self-pay | Admitting: Family Medicine

## 2016-08-02 ENCOUNTER — Ambulatory Visit: Payer: Commercial Managed Care - HMO | Admitting: Pulmonary Disease

## 2016-08-02 MED FILL — PANTOPRAZOLE SOD DR 40 MG T: 40 | 45 days supply | Qty: 45 | Fill #0

## 2016-08-02 MED FILL — TOPIRAMATE 50 MG TABLET: 50 | 30 days supply | Qty: 30 | Fill #0

## 2016-08-02 MED FILL — WARFARIN SODIUM 5 MG TABLET: 5 | 30 days supply | Qty: 30 | Fill #1

## 2016-08-02 MED FILL — PRAMIPEXOLE 1 MG TABLET: 1 | 30 days supply | Qty: 30 | Fill #2

## 2016-08-02 MED FILL — metFORMIN HCL 1000 MG TABS: 1000 | 30 days supply | Qty: 60 | Fill #1

## 2016-08-02 MED FILL — TOUJEO SOLOSTAR 300 UNITS/M: 300 | 32 days supply | Qty: 5 | Fill #0

## 2016-08-02 NOTE — Telephone Encounter (Signed)
Rx sent 

## 2016-08-03 ENCOUNTER — Other Ambulatory Visit: Payer: Self-pay | Admitting: Family Medicine

## 2016-08-03 ENCOUNTER — Ambulatory Visit: Payer: Commercial Managed Care - HMO | Admitting: Family Medicine

## 2016-08-03 MED ORDER — MIDODRINE HCL 5 MG PO TABS: 5.0000 mg | ORAL_TABLET | Freq: Three times a day (TID) | ORAL | 2 refills | Status: DC

## 2016-08-03 MED ORDER — SERTRALINE HCL 100 MG PO TABS: 100.0000 mg | ORAL_TABLET | Freq: Two times a day (BID) | ORAL | 1 refills | Status: DC

## 2016-08-04 ENCOUNTER — Ambulatory Visit (INDEPENDENT_AMBULATORY_CARE_PROVIDER_SITE_OTHER): Payer: Commercial Managed Care - HMO | Admitting: *Deleted

## 2016-08-04 ENCOUNTER — Ambulatory Visit (INDEPENDENT_AMBULATORY_CARE_PROVIDER_SITE_OTHER): Payer: Commercial Managed Care - HMO | Admitting: Cardiology

## 2016-08-04 ENCOUNTER — Encounter: Payer: Self-pay | Admitting: Cardiology

## 2016-08-04 VITALS — BP 142/74 | HR 89 | Ht 68.0 in | Wt 170.8 lb

## 2016-08-04 DIAGNOSIS — I480 Paroxysmal atrial fibrillation: Secondary | ICD-10-CM | POA: Diagnosis not present

## 2016-08-04 DIAGNOSIS — Z5181 Encounter for therapeutic drug level monitoring: Secondary | ICD-10-CM | POA: Diagnosis not present

## 2016-08-04 DIAGNOSIS — I10 Essential (primary) hypertension: Secondary | ICD-10-CM

## 2016-08-04 DIAGNOSIS — I4510 Unspecified right bundle-branch block: Secondary | ICD-10-CM | POA: Diagnosis not present

## 2016-08-04 DIAGNOSIS — Z7901 Long term (current) use of anticoagulants: Secondary | ICD-10-CM

## 2016-08-04 LAB — POCT INR: INR: 1.7

## 2016-08-04 MED ORDER — METOPROLOL SUCCINATE ER 50 MG PO TB24: ORAL_TABLET | ORAL | 6 refills | Status: DC

## 2016-08-04 MED FILL — METOPROLOL SUCC ER 50 MG TA: 50 | 30 days supply | Qty: 45 | Fill #0

## 2016-08-04 NOTE — Progress Notes (Signed)
08/04/2016 [name] [name]   04-29-1946  RN:[id number]  Primary Physician Penni Homans, MD Primary Cardiologist: [name]    Reason for Visit/CC: F/u for PAF  HPI:  [name] [name] is a 70 y.o. male, followed by [name], with h/o CAD with CABG in 2000.  He had a stent to the RCA in 2009.  Last cath was in 2016 and stent and grafts were patent. He has diabetes which is poorly controlled. This is followed by his PCP and he is on insulin. He also has a history of paroxysmal atrial fibrillation. Coumadin was started but stopped due to recent neck surgery. He was placed on Plavix in the interim. He is on rate control therapy with metoprolol for his PAF.    Last office visit 6 weeks ago he complained of symptoms of breakthrough atrial fibrillation. [name] elected to increase his Metroprolol to 50 mg daily. He outlined in his note that if symptoms persisted, he would consider antiarrhythmic therapy however he wanted the patient to be on Coumadin before starting an antiarrhythmic medication. [name] ordered him to restart his Coumadin once cleared by his neurosurgeon and stop Plavix once resumed.  He presents back to clinic today for 6 week follow-up. He continues to experience frequent symptoms of breakthrough atrial fibrillation. He reports that symptoms occur on average about 2 times a week. He notes palpitations. Episodes typically last anywhere from 20 minutes to one hour. Today he feels that he is in a normal rhythm. This is also confirmed by EKG which shows sinus rhythm with a rate of 86 bpm. His blood pressure is mildly elevated in the 0000000 systolic. He denies any anterior chest discomfort but does note atypical left arm pain described as sharp shooting pain down the length of his left arm. This occurs without any particular pattern. Symptoms are typically relieved with ice and rubbing his left shoulder. Symptoms are more consistent with muskuloskeletal etiology.  Current  Outpatient Prescriptions  Medication Sig Dispense Refill  . atorvastatin (LIPITOR) 40 MG tablet TAKE 1 TABLET BY MOUTH DAILY AT 6PM 30 tablet 5  . baclofen (LIORESAL) 10 MG tablet Take 0.5 tablets (5 mg total) by mouth at bedtime. 30 each 0  . clopidogrel (PLAVIX) 75 MG tablet Take 1 tablet (75 mg total) by mouth daily. 30 tablet 3  . clotrimazole-betamethasone (LOTRISONE) cream Apply 1 application topically 2 (two) times daily.    . cyclobenzaprine (FLEXERIL) 10 MG tablet Take 1 tablet (10 mg total) by mouth 3 (three) times daily as needed for muscle spasms. 30 tablet 0  . diclofenac sodium (VOLTAREN) 1 % GEL Apply 2 g topically daily as needed (for pain in affected area).    . gabapentin (NEURONTIN) 300 MG capsule Take 2 capsules (600 mg) by mouth  every morning, 1 capsule (300 mg) by mouth  at noon, and 2 capsules (600 mg) by mouth at night    . glucose blood (ACCU-CHEK AVIVA) test strip Use as directed to check blood sugar 4 times daily.  DX E11.9 200 each 6  . HYDROcodone-acetaminophen (NORCO/VICODIN) 5-325 MG tablet Take 1-2 tablets by mouth every 4 (four) hours as needed (mild pain). 80 tablet 0  . insulin aspart (NOVOLOG) 100 UNIT/ML injection Inject 10-17 Units into the skin 3 (three) times daily with meals. Use 10-17 unit 3 times daily before meals 15 mL 4  . Insulin Glargine (TOUJEO SOLOSTAR) 300 UNIT/ML SOPN Inject 42 Units into the skin at bedtime. 3 pen 4  .  isosorbide mononitrate (IMDUR) 120 MG 24 hr tablet Take 120 mg by mouth daily.    Marland Kitchen levothyroxine (SYNTHROID, LEVOTHROID) 75 MCG tablet Take 1 tablet (75 mcg total) by mouth daily. 90 tablet 2  . metFORMIN (GLUCOPHAGE) 1000 MG tablet TAKE 1 TABLET (1,000 MG TOTAL) BY MOUTH 2 (TWO) TIMES DAILY WITH A MEAL. 60 tablet 5  . metoprolol succinate (TOPROL-XL) 50 MG 24 hr tablet Take 1 and a half tablets once a day for a total of 75 mg. 45 tablet 6  . midodrine (PROAMATINE) 10 MG tablet Take 5 mg by mouth 3 (three) times daily.   0  .  midodrine (PROAMATINE) 5 MG tablet Take 1 tablet (5 mg total) by mouth 3 (three) times daily with meals. 90 tablet 2  . nitroGLYCERIN (NITROSTAT) 0.4 MG SL tablet Place 0.4 mg under the tongue every 5 (five) minutes as needed for chest pain.    . nitroGLYCERIN (NITROSTAT) 0.4 MG SL tablet Place 0.4 mg under the tongue every 5 (five) minutes as needed for chest pain (X 3 DOSES FOR CHEST PAIN).    Marland Kitchen ondansetron (ZOFRAN-ODT) 4 MG disintegrating tablet Take 1 tablet (4 mg total) by mouth every 8 (eight) hours as needed for nausea or vomiting. 20 tablet 0  . pantoprazole (PROTONIX) 40 MG tablet Take 40 mg by mouth daily.    . pantoprazole (PROTONIX) 40 MG tablet TAKE 1 TABLET BY MOUTH TWICE DAILY FOR 2 WEEKS THEN DROP TO ONCE DAILY 45 tablet 5  . pramipexole (MIRAPEX) 1 MG tablet Take 1 mg by mouth at bedtime.    . ranolazine (RANEXA) 500 MG 12 hr tablet Take 500 mg by mouth 2 (two) times daily.    . sertraline (ZOLOFT) 100 MG tablet Take 1 tablet (100 mg total) by mouth 2 (two) times daily. 180 tablet 1  . tiZANidine (ZANAFLEX) 4 MG tablet Take 1 tablet (4 mg total) by mouth every 8 (eight) hours as needed for muscle spasms. 90 tablet 2  . topiramate (TOPAMAX) 50 MG tablet TAKE 1 TABLET (50 MG) BY MOUTH AT BEDTIME. 30 tablet 3  . warfarin (COUMADIN) 5 MG tablet Take 1 tablet (5 mg total) by mouth daily. 30 tablet 3   No current facility-administered medications for this visit.     Allergies  Allergen Reactions  . Morphine Other (See Comments)    hallucinations  . Shellfish-Derived Products Itching    Social History   Social History  . Marital status: Married    Spouse name: N/A  . Number of children: 4  . Years of education: 64   Occupational History  . retired    Social History Main Topics  . Smoking status: Never Smoker  . Smokeless tobacco: Never Used  . Alcohol use No     Comment: stopped drinking in 1998  . Drug use: No  . Sexual activity: Yes    Partners: Female   Other  Topics Concern  . Not on file   Social History Narrative   Patient is right handed.   Patient does not drink caffeine.     Review of Systems: General: negative for chills, fever, night sweats or weight changes.  Cardiovascular: negative for chest pain, dyspnea on exertion, edema, orthopnea, palpitations, paroxysmal nocturnal dyspnea or shortness of breath Dermatological: negative for rash Respiratory: negative for cough or wheezing Urologic: negative for hematuria Abdominal: negative for nausea, vomiting, diarrhea, bright red blood per rectum, melena, or hematemesis Neurologic: negative for visual changes, syncope, or  dizziness All other systems reviewed and are otherwise negative except as noted above.    Blood pressure (!) 142/74, pulse 89, height 5\' 8"  (1.727 m), weight 170 lb 12.8 oz (77.5 kg), SpO2 98 %.  General appearance: alert, cooperative and no distress Neck: no carotid bruit and no JVD Lungs: clear to auscultation bilaterally Heart: regular rate and rhythm, S1, S2 normal, no murmur, click, rub or gallop Extremities: no LEE Pulses: 2+ and symmetric Skin: warm and dry Neurologic: Grossly normal  EKG NSR. RBBB. 86 bpm.   ASSESSMENT AND PLAN:   1. PAF: currently in NSR on EKG but he continues to have PAF, which he feels occurs ~2x/ week. Will discuss with [name] initiation of AAD therapy. For now, we will further increase his metoprolol to 75 mg daily given continued symtoms of intermittent palpitations and mildly elevated BP. Check INR today. Pharmacy to continue with dosing recommendations.   2. CAD: CABG in 2000.  He had a stent to the RCA in 2009.  Last cath was in 2016 and stent and grafts were patent. Stable. No cardiac symptoms. Continue medical therapy.  3. DM: followed by PCP.   PLAN f/u with [name] in 3 months.   Lyda Jester PA-C 08/04/2016 12:32 PM

## 2016-08-04 NOTE — Patient Instructions (Signed)
Your physician has recommended you make the following change in your medication:  1.) increase metoprolol to 75 mg (1 and a half tablets) once a day  Your physician recommends that you schedule a follow-up appointment in: 4 months with [name].

## 2016-08-05 ENCOUNTER — Ambulatory Visit: Payer: Commercial Managed Care - HMO | Admitting: Cardiology

## 2016-08-06 MED FILL — MIDODRINE HCL 10 MG TABLET: 10 | 30 days supply | Qty: 45 | Fill #0

## 2016-08-06 MED FILL — NOVOLOG FLEXPEN SYRINGE: 100 | 30 days supply | Qty: 15 | Fill #0

## 2016-08-06 MED FILL — SERTRALINE HCL 100 MG TAB: 100 | 90 days supply | Qty: 180 | Fill #0

## 2016-08-10 ENCOUNTER — Ambulatory Visit (HOSPITAL_BASED_OUTPATIENT_CLINIC_OR_DEPARTMENT_OTHER): Payer: Commercial Managed Care - HMO | Attending: Pulmonary Disease | Admitting: Pulmonary Disease

## 2016-08-10 DIAGNOSIS — R0683 Snoring: Secondary | ICD-10-CM | POA: Insufficient documentation

## 2016-08-10 DIAGNOSIS — Z79899 Other long term (current) drug therapy: Secondary | ICD-10-CM | POA: Diagnosis not present

## 2016-08-10 DIAGNOSIS — G2581 Restless legs syndrome: Secondary | ICD-10-CM | POA: Insufficient documentation

## 2016-08-10 DIAGNOSIS — G4733 Obstructive sleep apnea (adult) (pediatric): Secondary | ICD-10-CM | POA: Diagnosis not present

## 2016-08-11 ENCOUNTER — Ambulatory Visit (INDEPENDENT_AMBULATORY_CARE_PROVIDER_SITE_OTHER): Payer: Commercial Managed Care - HMO | Admitting: Pharmacist

## 2016-08-11 DIAGNOSIS — Z7901 Long term (current) use of anticoagulants: Secondary | ICD-10-CM

## 2016-08-11 DIAGNOSIS — Z5181 Encounter for therapeutic drug level monitoring: Secondary | ICD-10-CM

## 2016-08-11 DIAGNOSIS — I480 Paroxysmal atrial fibrillation: Secondary | ICD-10-CM

## 2016-08-11 LAB — POCT INR: INR: 2.9

## 2016-08-11 NOTE — Procedures (Signed)
NAME: [name] DATE OF BIRTH:  1946-03-28 MEDICAL RECORD NUMBER [id number]  LOCATION: [facility]  PHYSICIAN: [place]  DATE OF STUDY: 08/10/2016   Patient Name: [name] [name], [name] [name] Date: 08/10/2016   Gender: Male  D.O.B: May 05, 1946  Age (years): 69  Referring Provider: [place]   Height (inches): 69  Interpreting Physician: [place]   Weight (lbs): 170  RPSGT: Earney Hamburg   BMI: 25  MRN: [id number]  Neck Size: 16.50     CLINICAL INFORMATION  Sleep Study Type: Split Night CPAP  Indication for sleep study: OSA  Epworth Sleepiness Score: 14   SLEEP STUDY TECHNIQUE  As per the AASM Manual for the Scoring of Sleep and Associated Events v2.3 (April 2016) with a hypopnea requiring 4% desaturations.  The channels recorded and monitored were frontal, central and occipital EEG, electrooculogram (EOG), submentalis EMG (chin), nasal and oral airflow, thoracic and abdominal wall motion, anterior tibialis EMG, snore microphone, electrocardiogram, and pulse oximetry. Continuous positive airway pressure (CPAP) was initiated when the patient met split night criteria and was titrated according to treat sleep-disordered breathing.  MEDICATIONS  Medications taken by the patient : INSULIN TOUJEO. Meds reviewed per chart review. Medications administered by patient during sleep study : No sleep medicine administered.   RESPIRATORY PARAMETERS  Diagnostic Total AHI (/hr):  14.1 RDI (/hr): 26.6 OA Index (/hr):  0.4 CA Index (/hr):  0.0  REM AHI (/hr):  N/A NREM AHI (/hr):  14.1 Supine AHI (/hr):  14.1 Non-supine AHI (/hr):  N/A  Min O2 Sat (%): 88.00 Mean O2 (%): 93.33 Time below 88% (min): 0.1    Titration Optimal Pressure (cm): 5 AHI at Optimal Pressure (/hr): 2.4 Min O2 at Optimal Pressure (%): 91.0  Supine % at Optimal (%): 100 Sleep % at Optimal (%): 71     SLEEP ARCHITECTURE  The recording time for the entire  night was 408.0 minutes. During a baseline period of 204.4 minutes, the patient slept for 148.7 minutes in REM and nonREM, yielding a sleep efficiency of 72.7%. Sleep onset after lights out was 9.2 minutes with a REM latency of N/A minutes. The patient spent 2.69% of the night in stage N1 sleep, 97.31% in stage N2 sleep, 0.00% in stage N3 and 0.00% in REM.  During the titration period of 201.1 minutes, the patient slept for 120.5 minutes in REM and nonREM, yielding a sleep efficiency of 59.9%. Sleep onset after CPAP initiation was 5.3 minutes with a REM latency of N/A minutes. The patient spent 6.64% of the night in stage N1 sleep, 93.36% in stage N2 sleep, 0.00% in stage N3 and 0.00% in REM.  CARDIAC DATA  The 2 lead EKG demonstrated sinus rhythm. The mean heart rate was 62.19 beats per minute. Other EKG findings include: None.   LEG MOVEMENT DATA  The total Periodic Limb Movements of Sleep (PLMS) were 47. The PLMS index was 10.44 .  IMPRESSIONS  1. Mild obstructive sleep apnea occurred during the diagnostic portion of the study (AHI = 14.1 /hour). An adequate PAP pressure was selected for this patient ( 5 cm of water). Patient did not have REM sleep during this study. 2. No significant central sleep apnea occurred during the diagnostic portion of the study (CAI = 0.0/hour). 3. Severe oxygen desaturation was noted during the diagnostic portion of the study (Min O2 = 88.00%). 4. The patient snored with Moderate snoring  volume during the diagnostic portion of the study. 5. No cardiac abnormalities were noted during this study. 6. Mild periodic limb movements of sleep occurred during the study.   DIAGNOSIS  Obstructive Sleep Apnea (327.23 [G47.33 ICD-10]) Elevated PLMSI. Patient is known to have RLS.   RECOMMENDATIONS  1. Trial of autoCPAP therapy on 5-15 cm H2O with a Small size Fisher&Paykel Full Face Mask Simplus mask and heated humidification. We need to make sure OSA is corrected with  autoCPAP. Otherwise, patient will need a BiPaP titration study. 2. Avoid alcohol, sedatives and other CNS depressants that may worsen sleep apnea and disrupt normal sleep architecture. 3. Sleep hygiene should be reviewed to assess factors that may improve sleep quality. 4. Weight management and regular exercise should be initiated or continued. 5. Follow up in the office 4-6 weeks after obtaining CPAP machine.   [name], MD 08/22/2016, 6:59 AM [facility] Pager [phone] After 3 pm or if no answer, call [phone]

## 2016-08-20 ENCOUNTER — Ambulatory Visit (INDEPENDENT_AMBULATORY_CARE_PROVIDER_SITE_OTHER): Payer: Commercial Managed Care - HMO | Admitting: Pharmacist

## 2016-08-20 DIAGNOSIS — I480 Paroxysmal atrial fibrillation: Secondary | ICD-10-CM

## 2016-08-20 DIAGNOSIS — Z5181 Encounter for therapeutic drug level monitoring: Secondary | ICD-10-CM | POA: Diagnosis not present

## 2016-08-20 DIAGNOSIS — Z7901 Long term (current) use of anticoagulants: Secondary | ICD-10-CM

## 2016-08-20 LAB — POCT INR: INR: 2.3

## 2016-08-22 ENCOUNTER — Telehealth: Payer: Self-pay | Admitting: Pulmonary Disease

## 2016-08-22 DIAGNOSIS — G4733 Obstructive sleep apnea (adult) (pediatric): Secondary | ICD-10-CM

## 2016-08-22 NOTE — Telephone Encounter (Signed)
  Please call the pt and tell the pt the [place]  showed OSA.   Pt stops breathing 14   times an hour.   Please order autoCPAP 5-15 cm H2O. Patient will need a Small size Fisher&Paykel Full Face Mask Simplus mask  and/or a mask fitting session if this mask does not work. Patient will need a 1 month download.    Patient needs to be seen by me or any of the NPs/APPs  4-6 weeks after obtaining the cpap machine. Let me know if you receive this.   Thanks!   J. Shirl Harris, MD 08/22/2016, 7:01 AM

## 2016-08-23 ENCOUNTER — Telehealth: Payer: Self-pay | Admitting: *Deleted

## 2016-08-23 MED ORDER — AMIODARONE HCL 400 MG PO TABS: 400.0000 mg | ORAL_TABLET | Freq: Every day | ORAL | 0 refills | Status: DC

## 2016-08-23 MED FILL — AMIODARONE HCL 200 MG TAB: 200 | 30 days supply | Qty: 60 | Fill #0

## 2016-08-23 NOTE — Telephone Encounter (Signed)
Per Ellen Henri, PA-C, called pt re: Atrial-Fib and that per [name], he wants pt to start on Amiodarone 400 mg qd for 1 month and come back and see him or [place], PA-C, within that month to have dose decreased.  Pt is scheduled for f/u with BS 09/05/16 arriving at 9:45.  Pt agreeable with this plan and verbalized understanding.

## 2016-08-23 NOTE — Telephone Encounter (Signed)
-----   Message from Consuelo Pandy, [place] sent at 08/18/2016  4:52 PM EDT ----- Please make sure patient gets Rx for amiodarone sent to pharmacy and please notify patient of Dr. Erick Colace recommendations to start med for afib.   Thanks

## 2016-08-23 NOTE — Telephone Encounter (Signed)
Spoke with pt, aware of results/recs.  Pt was originally scheduled for cpap rov tomorrow, this has been pushed back to November to ensure proper follow-up of cpap compliance.  cpap ordered.  Nothing further needed.

## 2016-08-24 ENCOUNTER — Telehealth: Payer: Self-pay | Admitting: Interventional Cardiology

## 2016-08-24 ENCOUNTER — Ambulatory Visit (INDEPENDENT_AMBULATORY_CARE_PROVIDER_SITE_OTHER): Payer: Commercial Managed Care - HMO | Admitting: Family Medicine

## 2016-08-24 ENCOUNTER — Ambulatory Visit: Payer: Commercial Managed Care - HMO | Admitting: Pulmonary Disease

## 2016-08-24 ENCOUNTER — Encounter: Payer: Self-pay | Admitting: Family Medicine

## 2016-08-24 DIAGNOSIS — I10 Essential (primary) hypertension: Secondary | ICD-10-CM

## 2016-08-24 DIAGNOSIS — Z23 Encounter for immunization: Secondary | ICD-10-CM

## 2016-08-24 DIAGNOSIS — M4802 Spinal stenosis, cervical region: Secondary | ICD-10-CM | POA: Diagnosis not present

## 2016-08-24 DIAGNOSIS — Z794 Long term (current) use of insulin: Secondary | ICD-10-CM

## 2016-08-24 DIAGNOSIS — E1100 Type 2 diabetes mellitus with hyperosmolarity without nonketotic hyperglycemic-hyperosmolar coma (NKHHC): Secondary | ICD-10-CM

## 2016-08-24 DIAGNOSIS — E785 Hyperlipidemia, unspecified: Secondary | ICD-10-CM

## 2016-08-24 NOTE — Assessment & Plan Note (Signed)
Toujeo dropped to 44 last night because he had a low of 36. He feels well today. Eats small meals frequently. Uses Novolog 6 units tid with meals. Eating better. Will continue to monitor

## 2016-08-24 NOTE — Assessment & Plan Note (Signed)
Well controlled, no changes to meds. Encouraged heart healthy diet such as the DASH diet and exercise as tolerated.  °

## 2016-08-24 NOTE — Telephone Encounter (Signed)
Left message for patient to call back  

## 2016-08-24 NOTE — Progress Notes (Signed)
Pre visit review using our clinic review tool, if applicable. No additional management support is needed unless otherwise documented below in the visit note. 

## 2016-08-24 NOTE — Assessment & Plan Note (Signed)
Healing well but still has some muscle spasm on right SCM at times. Just seen by Neurosurgery, Dr Annette Stable and they feel he is healing well after surgery. Try topical meds including.Lidocaine

## 2016-08-24 NOTE — Patient Instructions (Signed)

## 2016-08-24 NOTE — Telephone Encounter (Signed)
New message   Pt wife verbalized that she is calling to talk to the physician only because she wants to go over the medication warnings

## 2016-08-24 NOTE — Telephone Encounter (Signed)
Follow Up:; ° ° °Returning your call. °

## 2016-08-24 NOTE — Telephone Encounter (Signed)
Patient's wife (DPR) is calling about her husband taking amiodarone. Patient's wife states she is scared to give patient amiodarone because of all the side effects with this medication. Informed patient's wife that side effects are a precaution, and that does not mean they will happen. Encouraged wife to have patient start medication and if patient cannot tolerate medication to call our office. Patient's wife verbalized understanding and will call with any other concerns or questions.

## 2016-09-02 ENCOUNTER — Ambulatory Visit: Payer: Commercial Managed Care - HMO | Admitting: Cardiology

## 2016-09-02 ENCOUNTER — Other Ambulatory Visit: Payer: Self-pay | Admitting: Family Medicine

## 2016-09-02 MED FILL — PRAMIPEXOLE 1 MG TABLET: 1 | 30 days supply | Qty: 30 | Fill #0

## 2016-09-02 MED FILL — WARFARIN SODIUM 5 MG TABLET: 5 | 30 days supply | Qty: 30 | Fill #2

## 2016-09-02 MED FILL — TOPIRAMATE 50 MG TABLET: 50 | 30 days supply | Qty: 30 | Fill #1

## 2016-09-02 MED FILL — TOUJEO SOLOSTAR 300 UNITS/M: 300 | 32 days supply | Qty: 5 | Fill #1

## 2016-09-02 MED FILL — metFORMIN HCL 1000 MG TABS: 1000 | 30 days supply | Qty: 60 | Fill #2

## 2016-09-03 ENCOUNTER — Telehealth: Payer: Self-pay | Admitting: Pharmacist

## 2016-09-03 ENCOUNTER — Encounter: Payer: Self-pay | Admitting: Interventional Cardiology

## 2016-09-03 ENCOUNTER — Ambulatory Visit (INDEPENDENT_AMBULATORY_CARE_PROVIDER_SITE_OTHER): Payer: Commercial Managed Care - HMO | Admitting: *Deleted

## 2016-09-03 DIAGNOSIS — I480 Paroxysmal atrial fibrillation: Secondary | ICD-10-CM | POA: Diagnosis not present

## 2016-09-03 DIAGNOSIS — Z7901 Long term (current) use of anticoagulants: Secondary | ICD-10-CM

## 2016-09-03 DIAGNOSIS — Z5181 Encounter for therapeutic drug level monitoring: Secondary | ICD-10-CM | POA: Diagnosis not present

## 2016-09-03 LAB — POCT INR: INR: 1.6

## 2016-09-03 NOTE — Telephone Encounter (Signed)
Mrs. [name] [name] called to report that patient missed dose last night because they had not picked up medication from pharmacy.   Will have him boost 2 days (1.5 tablet today <as already directed> and tomorrow for missed dose).   Pt picked up medication on way home today.

## 2016-09-04 NOTE — Progress Notes (Signed)
Patient ID: [name] [name], male   DOB: 1946/06/21, 70 y.o.   MRN: [id number]   Subjective:    Patient ID: [name] [name], male    DOB: 1946/09/21, 70 y.o.   MRN: [id number]  Chief Complaint  Patient presents with  . Follow-up    HPI Patient is in today for follow up. He notes a good improvement in his daily sugar numbers since using Tuojeo routinely. His neck is healing slowly after surgery. No recent hospitalization or acute illness. No polyuria or  Polydipsia. Denies CP/palp/SOB/HA/congestion/fevers/GI or GU c/o. Taking meds as prescribed  Past Medical History:  Diagnosis Date  . ACP (advance care planning) 05/11/2015  . Advance care planning 05/01/2015  . Anemia    hemoglobin 7.4, iron deficiency, January, 2011, 2 unit transfusion, endoscopy normal, capsule endoscopy February, 2011 no small bowel abnormalities.   Most likely source gastric erosions, followed by GI  . Anxiety   . CAD (coronary artery disease)    A. CABG in 2000,status post cardiac cath in 2006, 2009 ....continued chest pain and SOB despite oral medication adjestments including Ranexa. B. Cath November 2009/ mRCA - 2.75 x 23 Abbott Xience V drug-eluting stent ...11/26/2008 to distal  RCA leading to acute marginal.  C. Cath 07/2012 for CP - stable anatomy, med rx. d. cath 2015 and 05/30/2015 stable anatomy, consider Myoview if has CP again  . Cerebral ischemia    MRI November, 2010, chronic microvascular ischemia  . Chronic diastolic CHF (congestive heart failure) ([place]) 03/13/2016  . Depression    Bipolar  . Diabetes mellitus type II, uncontrolled ([place])   . Diabetes mellitus without complication ([place])   . Diarrhea 02/15/2016  . Dizziness   . Edema   . Ejection fraction    EF 60%, echo, July 31, 2012  . Falling episodes    these have occurred in the past and again recurring 2011  . Family history of adverse reaction to anesthesia    "mother died during bypass surgery but not sure if it has to do with anesthesia"    . Gastric ulcer   . GERD (gastroesophageal reflux disease)   . H/O medication noncompliance    Due to loss of insurance  . Hard of hearing   . Headache 01/25/2016  . Hx of CABG    2000,  / one median sternotomy suture broken her chest x-ray November, 2010, no clinical significance  . Hyperlipidemia   . Hypertension    pt. denies  . Iron deficiency anemia   . Low back pain 06/12/2009   Qualifier: Diagnosis of  By: [name]   . Nausea with vomiting 01/25/2016  . Nephrolithiasis   . OSA (obstructive sleep apnea)   . Pain in limb 06/12/2009   Qualifier: Diagnosis of  By: [name]   . Palpitations    event recorder showed sinus rhythm  . RBBB (right bundle branch block)   . Restless leg   . Right knee pain 01/07/2015  . Shortness of breath    CPX April, 2011, mild functional limitation, no clear pulmonary or cardiac limitation, possible deconditioning and mild chronotropic incompetence( peak heart rate 130)  . Spondylosis    C5-6, C6-7 MRI 2010  . Syncope 03/2016  . Thyroid disease   . Tubulovillous adenoma of colon 2007  . Wears glasses     Past Surgical History:  Procedure Laterality Date  . ANTERIOR CERVICAL DECOMP/DISCECTOMY FUSION N/A 05/31/2016   Procedure: ANTERIOR CERVICAL DECOMPRESSION/DISCECTOMY  FUSION CERVICAL FIVE-SIX,CERVICAL SIX-SEVEN;  Surgeon: [name], MD;  Location: [facility];  Service: Neurosurgery;  Laterality: N/A;  . CARDIAC CATHETERIZATION N/A 05/30/2015   Procedure: Left Heart Cath and Coronary Angiography;  Surgeon: [name], MD;  Location: [facility];  Service: Cardiovascular;  Laterality: N/A;  . COLONOSCOPY    . CORONARY ARTERY BYPASS GRAFT     2000  . ESOPHAGOGASTRODUODENOSCOPY    . LEFT HEART CATHETERIZATION WITH CORONARY/GRAFT ANGIOGRAM N/A 08/01/2012   Procedure: LEFT HEART CATHETERIZATION WITH Beatrix Fetters;  Surgeon: [name], MD;  Location: [facility];  Service: Cardiovascular;  Laterality: N/A;   . LEFT HEART CATHETERIZATION WITH CORONARY/GRAFT ANGIOGRAM N/A 01/03/2015   Procedure: LEFT HEART CATHETERIZATION WITH Beatrix Fetters;  Surgeon: [name], MD;  Location: [facility] CATH LAB;  Service: Cardiovascular;  Laterality: N/A;  . NASAL SEPTUM SURGERY     UP3  . PERCUTANEOUS CORONARY STENT INTERVENTION (PCI-S)  10/2008   mRCA PCI  2.75 x 23 Abbott Xience V drug-eluting stent     Family History  Problem Relation Age of Onset  . Pancreatic cancer Brother   . Diabetes Brother     4   . Stroke Brother   . Diabetes Mother   . Heart failure Mother   . Heart failure Father   . Cancer Brother   . Coronary artery disease Brother   . Hypothyroidism Brother   . Heart attack      Nephew  . Irregular heart beat Daughter   . Cancer Maternal Grandmother     unknown   . Other Brother     colon surgery    Social History   Social History  . Marital status: Married    Spouse name: N/A  . Number of children: 4  . Years of education: 49   Occupational History  . retired    Social History Main Topics  . Smoking status: Never Smoker  . Smokeless tobacco: Never Used  . Alcohol use No     Comment: stopped drinking in 1998  . Drug use: No  . Sexual activity: Yes    Partners: Female   Other Topics Concern  . Not on file   Social History Narrative   Patient is right handed.   Patient does not drink caffeine.    Outpatient Medications Prior to Visit  Medication Sig Dispense Refill  . amiodarone (PACERONE) 400 MG tablet Take 1 tablet (400 mg total) by mouth daily. 30 tablet 0  . atorvastatin (LIPITOR) 40 MG tablet TAKE 1 TABLET BY MOUTH DAILY AT 6PM 30 tablet 5  . baclofen (LIORESAL) 10 MG tablet Take 0.5 tablets (5 mg total) by mouth at bedtime. 30 each 0  . clopidogrel (PLAVIX) 75 MG tablet Take 1 tablet (75 mg total) by mouth daily. 30 tablet 3  . clotrimazole-betamethasone (LOTRISONE) cream Apply 1 application topically 2 (two) times daily.    . cyclobenzaprine  (FLEXERIL) 10 MG tablet Take 1 tablet (10 mg total) by mouth 3 (three) times daily as needed for muscle spasms. 30 tablet 0  . diclofenac sodium (VOLTAREN) 1 % GEL Apply 2 g topically daily as needed (for pain in affected area).    . gabapentin (NEURONTIN) 300 MG capsule Take 2 capsules (600 mg) by mouth  every morning, 1 capsule (300 mg) by mouth  at noon, and 2 capsules (600 mg) by mouth at night    . glucose blood (ACCU-CHEK AVIVA) test strip Use as  directed to check blood sugar 4 times daily.  DX E11.9 200 each 6  . HYDROcodone-acetaminophen (NORCO/VICODIN) 5-325 MG tablet Take 1-2 tablets by mouth every 4 (four) hours as needed (mild pain). 80 tablet 0  . insulin aspart (NOVOLOG) 100 UNIT/ML injection Inject 10-17 Units into the skin 3 (three) times daily with meals. Use 10-17 unit 3 times daily before meals 15 mL 4  . Insulin Glargine (TOUJEO SOLOSTAR) 300 UNIT/ML SOPN Inject 42 Units into the skin at bedtime. 3 pen 4  . isosorbide mononitrate (IMDUR) 120 MG 24 hr tablet Take 120 mg by mouth daily.    Marland Kitchen levothyroxine (SYNTHROID, LEVOTHROID) 75 MCG tablet Take 1 tablet (75 mcg total) by mouth daily. 90 tablet 2  . metFORMIN (GLUCOPHAGE) 1000 MG tablet TAKE 1 TABLET (1,000 MG TOTAL) BY MOUTH 2 (TWO) TIMES DAILY WITH A MEAL. 60 tablet 5  . metoprolol succinate (TOPROL-XL) 50 MG 24 hr tablet Take 1 and a half tablets once a day for a total of 75 mg. 45 tablet 6  . nitroGLYCERIN (NITROSTAT) 0.4 MG SL tablet Place 0.4 mg under the tongue every 5 (five) minutes as needed for chest pain.    Marland Kitchen ondansetron (ZOFRAN-ODT) 4 MG disintegrating tablet Take 1 tablet (4 mg total) by mouth every 8 (eight) hours as needed for nausea or vomiting. 20 tablet 0  . pantoprazole (PROTONIX) 40 MG tablet Take 40 mg by mouth daily.    . pantoprazole (PROTONIX) 40 MG tablet TAKE 1 TABLET BY MOUTH TWICE DAILY FOR 2 WEEKS THEN DROP TO ONCE DAILY 45 tablet 5  . pramipexole (MIRAPEX) 1 MG tablet Take 1 mg by mouth at bedtime.      . ranolazine (RANEXA) 500 MG 12 hr tablet Take 500 mg by mouth 2 (two) times daily.    . sertraline (ZOLOFT) 100 MG tablet Take 1 tablet (100 mg total) by mouth 2 (two) times daily. 180 tablet 1  . tiZANidine (ZANAFLEX) 4 MG tablet Take 1 tablet (4 mg total) by mouth every 8 (eight) hours as needed for muscle spasms. 90 tablet 2  . topiramate (TOPAMAX) 50 MG tablet TAKE 1 TABLET (50 MG) BY MOUTH AT BEDTIME. 30 tablet 3  . warfarin (COUMADIN) 5 MG tablet Take 1 tablet (5 mg total) by mouth daily. 30 tablet 3  . midodrine (PROAMATINE) 10 MG tablet Take 5 mg by mouth 3 (three) times daily.   0  . midodrine (PROAMATINE) 5 MG tablet Take 1 tablet (5 mg total) by mouth 3 (three) times daily with meals. 90 tablet 2   No facility-administered medications prior to visit.     Allergies  Allergen Reactions  . Morphine Other (See Comments)    hallucinations  . Shellfish-Derived Products Itching    Review of Systems  Constitutional: Positive for malaise/fatigue. Negative for fever.  HENT: Negative for congestion.   Eyes: Negative for blurred vision.  Respiratory: Negative for shortness of breath.   Cardiovascular: Negative for chest pain, palpitations and leg swelling.  Gastrointestinal: Negative for abdominal pain, blood in stool and nausea.  Genitourinary: Negative for dysuria and frequency.  Musculoskeletal: Positive for back pain, myalgias and neck pain. Negative for falls.  Skin: Negative for rash.  Neurological: Negative for dizziness, loss of consciousness and headaches.  Endo/Heme/Allergies: Negative for environmental allergies.  Psychiatric/Behavioral: Negative for depression. The patient is not nervous/anxious.        Objective:    Physical Exam  Constitutional: He is oriented to person, place, and  time. He appears well-developed and well-nourished. No distress.  HENT:  Head: Normocephalic and atraumatic.  Nose: Nose normal.  Eyes: Right eye exhibits no discharge. Left eye  exhibits no discharge.  Neck: Normal range of motion. Neck supple.  Cardiovascular: Normal rate and regular rhythm.   Pulmonary/Chest: Effort normal and breath sounds normal.  Abdominal: Soft. Bowel sounds are normal. There is no tenderness.  Musculoskeletal: He exhibits no edema.  Neurological: He is alert and oriented to person, place, and time.  Skin: Skin is warm and dry.  Psychiatric: He has a normal mood and affect.  Nursing note and vitals reviewed.   BP (!) 144/65 (BP Location: Left Arm, Patient Position: Sitting, Cuff Size: Normal)   Pulse 67   Temp 97.9 F (36.6 C) (Oral)   Ht 5' 9"  (1.753 m)   Wt 172 lb 2 oz (78.1 kg)   SpO2 100%   BMI 25.42 kg/m  Wt Readings from Last 3 Encounters:  08/24/16 172 lb 2 oz (78.1 kg)  08/10/16 170 lb (77.1 kg)  08/04/16 170 lb 12.8 oz (77.5 kg)     Lab Results  Component Value Date   WBC 8.5 06/10/2016   HGB 11.6 (L) 06/10/2016   HCT 34.1 (L) 06/10/2016   PLT 244 06/10/2016   GLUCOSE 226 (H) 06/10/2016   CHOL 75 02/09/2016   TRIG 244.0 (H) 02/09/2016   HDL 27.20 (L) 02/09/2016   LDLDIRECT 25.0 02/09/2016   LDLCALC 107 (H) 05/01/2015   ALT 13 (L) 06/10/2016   AST 19 06/10/2016   NA 134 (L) 06/10/2016   K 4.2 06/10/2016   CL 103 06/10/2016   CREATININE 0.91 06/10/2016   BUN 7 06/10/2016   CO2 22 06/10/2016   TSH 5.287 (H) 03/10/2016   PSA 2.39 11/25/2010   INR 1.6 09/03/2016   HGBA1C 9.0 (H) 05/25/2016   MICROALBUR 1.1 05/01/2015    Lab Results  Component Value Date   TSH 5.287 (H) 03/10/2016   Lab Results  Component Value Date   WBC 8.5 06/10/2016   HGB 11.6 (L) 06/10/2016   HCT 34.1 (L) 06/10/2016   MCV 89.0 06/10/2016   PLT 244 06/10/2016   Lab Results  Component Value Date   NA 134 (L) 06/10/2016   K 4.2 06/10/2016   CO2 22 06/10/2016   GLUCOSE 226 (H) 06/10/2016   BUN 7 06/10/2016   CREATININE 0.91 06/10/2016   BILITOT 0.6 06/10/2016   ALKPHOS 82 06/10/2016   AST 19 06/10/2016   ALT 13 (L)  06/10/2016   PROT 6.2 (L) 06/10/2016   ALBUMIN 3.3 (L) 06/10/2016   CALCIUM 9.0 06/10/2016   ANIONGAP 9 06/10/2016   GFR 69.40 03/17/2016   Lab Results  Component Value Date   CHOL 75 02/09/2016   Lab Results  Component Value Date   HDL 27.20 (L) 02/09/2016   Lab Results  Component Value Date   LDLCALC 107 (H) 05/01/2015   Lab Results  Component Value Date   TRIG 244.0 (H) 02/09/2016   Lab Results  Component Value Date   CHOLHDL 3 02/09/2016   Lab Results  Component Value Date   HGBA1C 9.0 (H) 05/25/2016       Assessment & Plan:   Problem List Items Addressed This Visit    Essential hypertension (Chronic)    Well controlled, no changes to meds. Encouraged heart healthy diet such as the DASH diet and exercise as tolerated.       Relevant Orders   Hemoglobin  A1C   Lipid Profile   Comp Met (CMET)   CBC   TSH   Diabetes mellitus type II, uncontrolled (HCC) (Chronic)    Toujeo dropped to 44 last night because he had a low of 36. He feels well today. Eats small meals frequently. Uses Novolog 6 units tid with meals. Eating better. Will continue to monitor      Relevant Orders   Hemoglobin A1C   Lipid Profile   Comp Met (CMET)   CBC   TSH   Spinal stenosis in cervical region    Healing well but still has some muscle spasm on right SCM at times. Just seen by Neurosurgery, Dr Annette Stable and they feel he is healing well after surgery. Try topical meds including.Lidocaine      HLD (hyperlipidemia)    Encouraged heart healthy diet, increase exercise, avoid trans fats, consider a krill oil cap daily       Other Visit Diagnoses    Encounter for immunization       Relevant Orders   Flu Vaccine QUAD 36+ mos IM (Completed)      I have discontinued Mr. Askren midodrine and midodrine. I am also having him maintain his pantoprazole, tiZANidine, levothyroxine, glucose blood, clopidogrel, baclofen, pramipexole, ranolazine, ondansetron, gabapentin, atorvastatin, insulin  aspart, Insulin Glargine, isosorbide mononitrate, clotrimazole-betamethasone, diclofenac sodium, nitroGLYCERIN, metFORMIN, cyclobenzaprine, HYDROcodone-acetaminophen, warfarin, pantoprazole, topiramate, sertraline, metoprolol succinate, and amiodarone.  No orders of the defined types were placed in this encounter.    Penni Homans, MD

## 2016-09-04 NOTE — Assessment & Plan Note (Signed)
Encouraged heart healthy diet, increase exercise, avoid trans fats, consider a krill oil cap daily 

## 2016-09-06 ENCOUNTER — Ambulatory Visit (INDEPENDENT_AMBULATORY_CARE_PROVIDER_SITE_OTHER): Payer: Commercial Managed Care - HMO | Admitting: Interventional Cardiology

## 2016-09-06 ENCOUNTER — Encounter: Payer: Self-pay | Admitting: Interventional Cardiology

## 2016-09-06 VITALS — BP 130/70 | HR 80 | Ht 69.0 in | Wt 173.0 lb

## 2016-09-06 DIAGNOSIS — I25118 Atherosclerotic heart disease of native coronary artery with other forms of angina pectoris: Secondary | ICD-10-CM | POA: Diagnosis not present

## 2016-09-06 DIAGNOSIS — I480 Paroxysmal atrial fibrillation: Secondary | ICD-10-CM

## 2016-09-06 MED ORDER — MIDODRINE HCL 10 MG PO TABS: 10.0000 mg | ORAL_TABLET | Freq: Three times a day (TID) | ORAL | 3 refills | Status: DC

## 2016-09-06 MED FILL — MIDODRINE HCL 10 MG TABLET: 10 | 30 days supply | Qty: 90 | Fill #0
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
## 2016-09-06 NOTE — Patient Instructions (Signed)
Medication Instructions:  Same-no changes  Labwork: None  Testing/Procedures: None  Follow-Up: As previously planned      If you need a refill on your cardiac medications before your next appointment, please call your pharmacy.

## 2016-09-09 ENCOUNTER — Other Ambulatory Visit: Payer: Self-pay | Admitting: Family Medicine

## 2016-09-09 MED FILL — ISOSORBIDE MN ER 60 MG TAB: 60 | 90 days supply | Qty: 180 | Fill #0

## 2016-09-17 ENCOUNTER — Ambulatory Visit (INDEPENDENT_AMBULATORY_CARE_PROVIDER_SITE_OTHER): Payer: Commercial Managed Care - HMO | Admitting: *Deleted

## 2016-09-17 ENCOUNTER — Telehealth: Payer: Self-pay | Admitting: Interventional Cardiology

## 2016-09-17 DIAGNOSIS — I480 Paroxysmal atrial fibrillation: Secondary | ICD-10-CM | POA: Diagnosis not present

## 2016-09-17 DIAGNOSIS — Z5181 Encounter for therapeutic drug level monitoring: Secondary | ICD-10-CM | POA: Diagnosis not present

## 2016-09-17 LAB — POCT INR: INR: 4.6

## 2016-09-17 NOTE — Telephone Encounter (Signed)
New Message  Pt call requesting to speak with RN. Pt states he missed his coumadin appt on 10/12 and would like to speak with RN to see if he needs to reschedule as soon as possible. Pt insisted on speaking with RN. Please call back to discuss

## 2016-09-17 NOTE — Telephone Encounter (Signed)
Called spoke with pt missed appt 09/16/16 apologized got tied up at appt with his wife and missed appt yesterday.  Rescheduled appt for today 09/17/16 at 11am.

## 2016-09-22 ENCOUNTER — Ambulatory Visit: Payer: Commercial Managed Care - HMO | Admitting: Cardiology

## 2016-09-23 ENCOUNTER — Emergency Department (HOSPITAL_BASED_OUTPATIENT_CLINIC_OR_DEPARTMENT_OTHER): Payer: Commercial Managed Care - HMO

## 2016-09-23 ENCOUNTER — Encounter (HOSPITAL_BASED_OUTPATIENT_CLINIC_OR_DEPARTMENT_OTHER): Payer: Self-pay | Admitting: *Deleted

## 2016-09-23 ENCOUNTER — Emergency Department (HOSPITAL_BASED_OUTPATIENT_CLINIC_OR_DEPARTMENT_OTHER)
Admission: EM | Admit: 2016-09-23 | Discharge: 2016-09-24 | Disposition: A | Discharge status: Home / Self Care | Payer: Commercial Managed Care - HMO | Attending: Emergency Medicine | Admitting: Emergency Medicine

## 2016-09-23 DIAGNOSIS — Z79899 Other long term (current) drug therapy: Secondary | ICD-10-CM | POA: Diagnosis not present

## 2016-09-23 DIAGNOSIS — I110 Hypertensive heart disease with heart failure: Secondary | ICD-10-CM | POA: Insufficient documentation

## 2016-09-23 DIAGNOSIS — E039 Hypothyroidism, unspecified: Secondary | ICD-10-CM | POA: Insufficient documentation

## 2016-09-23 DIAGNOSIS — Z7984 Long term (current) use of oral hypoglycemic drugs: Secondary | ICD-10-CM | POA: Diagnosis not present

## 2016-09-23 DIAGNOSIS — I5032 Chronic diastolic (congestive) heart failure: Secondary | ICD-10-CM | POA: Diagnosis not present

## 2016-09-23 DIAGNOSIS — E860 Dehydration: Secondary | ICD-10-CM | POA: Insufficient documentation

## 2016-09-23 DIAGNOSIS — Z794 Long term (current) use of insulin: Secondary | ICD-10-CM | POA: Diagnosis not present

## 2016-09-23 DIAGNOSIS — E119 Type 2 diabetes mellitus without complications: Secondary | ICD-10-CM | POA: Diagnosis not present

## 2016-09-23 DIAGNOSIS — I2510 Atherosclerotic heart disease of native coronary artery without angina pectoris: Secondary | ICD-10-CM | POA: Insufficient documentation

## 2016-09-23 DIAGNOSIS — R51 Headache: Secondary | ICD-10-CM | POA: Diagnosis present

## 2016-09-23 DIAGNOSIS — N179 Acute kidney failure, unspecified: Secondary | ICD-10-CM

## 2016-09-23 DIAGNOSIS — R519 Headache, unspecified: Secondary | ICD-10-CM

## 2016-09-23 LAB — CBC WITH DIFFERENTIAL/PLATELET
Basophils Absolute: 0 10*3/uL (ref 0.0–0.1)
Basophils Relative: 0 %
EOS PCT: 4 %
Eosinophils Absolute: 0.4 10*3/uL (ref 0.0–0.7)
HCT: 40.4 % (ref 39.0–52.0)
Hemoglobin: 14.1 g/dL (ref 13.0–17.0)
LYMPHS ABS: 2.9 10*3/uL (ref 0.7–4.0)
LYMPHS PCT: 29 %
MCH: 29.4 pg (ref 26.0–34.0)
MCHC: 34.9 g/dL (ref 30.0–36.0)
MCV: 84.3 fL (ref 78.0–100.0)
MONO ABS: 0.8 10*3/uL (ref 0.1–1.0)
MONOS PCT: 8 %
Neutro Abs: 5.8 10*3/uL (ref 1.7–7.7)
Neutrophils Relative %: 59 %
PLATELETS: 197 10*3/uL (ref 150–400)
RBC: 4.79 MIL/uL (ref 4.22–5.81)
RDW: 15.2 % (ref 11.5–15.5)
WBC: 9.9 10*3/uL (ref 4.0–10.5)

## 2016-09-23 LAB — COMPREHENSIVE METABOLIC PANEL
ALT: 18 U/L (ref 17–63)
AST: 22 U/L (ref 15–41)
Albumin: 4.7 g/dL (ref 3.5–5.0)
Alkaline Phosphatase: 72 U/L (ref 38–126)
Anion gap: 10 (ref 5–15)
BILIRUBIN TOTAL: 0.5 mg/dL (ref 0.3–1.2)
BUN: 20 mg/dL (ref 6–20)
CHLORIDE: 106 mmol/L (ref 101–111)
CO2: 23 mmol/L (ref 22–32)
Calcium: 9.5 mg/dL (ref 8.9–10.3)
Creatinine, Ser: 1.32 mg/dL — ABNORMAL HIGH (ref 0.61–1.24)
GFR, EST NON AFRICAN AMERICAN: 54 mL/min — AB (ref >60)
Glucose, Bld: 77 mg/dL (ref 65–99)
POTASSIUM: 3.7 mmol/L (ref 3.5–5.1)
Sodium: 139 mmol/L (ref 135–145)
TOTAL PROTEIN: 7.8 g/dL (ref 6.5–8.1)

## 2016-09-23 MED ORDER — SODIUM CHLORIDE 0.9 % IV BOLUS (SEPSIS)
1000.0000 mL | Freq: Once | INTRAVENOUS | Status: AC
Start: 2016-09-23 — End: 2016-09-24
  Administered 2016-09-23: 1000 mL via INTRAVENOUS

## 2016-09-23 MED ORDER — MECLIZINE HCL 25 MG PO TABS
25.0000 mg | ORAL_TABLET | Freq: Once | ORAL | Status: AC
  Administered 2016-09-23: 25 mg via ORAL
  Filled 2016-09-23: qty 1

## 2016-09-23 NOTE — ED Notes (Signed)
Pt c/o intermittent dizziness worsening over the last three days.  He gets dizzy, then loses his balance and bumps into things.  Pt ambulated to the restroom with assistance and upon standing and leaving room, stumbled and had a dizzy spell and had to hold onto the wall.  Pt states after the dizzy spell, he gets head pain that starts at the base of his neck and goes all the way up to the top of his head.  Pt's face is symmetrical, speech is clear and strength is equal bilaterally.  Left arm and left leg do seem to drift slightly though.

## 2016-09-23 NOTE — ED Provider Notes (Signed)
[facility] DEPT Provider Note   CSN: [id number] Arrival date & time: 09/23/16  1901 By signing my name below, I, Dyke Brackett, attest that this documentation has been prepared under the direction and in the presence of No att. providers found . Electronically Signed: Dyke Brackett, Scribe. 09/23/2016. 9:33 PM.   History   Chief Complaint Chief Complaint  Patient presents with  . Headache   HPI [name] [name] is a 70 y.o. male with hx of DM, CHF, CAD and cerebral ischemia who presents to the Emergency Department complaining of sudden onset left-sided weakness three days ago. He states he was unable to walk without falling. He notes associated headache, left sided numbness, dizziness and slurred speech. He states that room is spinning and he feels lightheaded. Per pt, his head was swollen last night. Pt took one of his wife's muscle relaxers last night with relief of headache. Pt states he recently started on a blood thinner just prior to onset of his symptoms, but is unsure of what medication he is on.    The history is provided by the patient. No language interpreter was used.   Past Medical History:  Diagnosis Date  . ACP (advance care planning) 05/11/2015  . Advance care planning 05/01/2015  . Anemia    hemoglobin 7.4, iron deficiency, January, 2011, 2 unit transfusion, endoscopy normal, capsule endoscopy February, 2011 no small bowel abnormalities.   Most likely source gastric erosions, followed by GI  . Anxiety   . CAD (coronary artery disease)    A. CABG in 2000,status post cardiac cath in 2006, 2009 ....continued chest pain and SOB despite oral medication adjestments including Ranexa. B. Cath November 2009/ mRCA - 2.75 x 23 Abbott Xience V drug-eluting stent ...11/26/2008 to distal  RCA leading to acute marginal.  C. Cath 07/2012 for CP - stable anatomy, med rx. d. cath 2015 and 05/30/2015 stable anatomy, consider Myoview if has CP again  . Cerebral ischemia    MRI November,  2010, chronic microvascular ischemia  . Chronic diastolic CHF (congestive heart failure) ([place]) 03/13/2016  . Depression    Bipolar  . Diabetes mellitus type II, uncontrolled ([place])   . Diabetes mellitus without complication ([place])   . Diarrhea 02/15/2016  . Dizziness   . Edema   . Ejection fraction    EF 60%, echo, July 31, 2012  . Falling episodes    these have occurred in the past and again recurring 2011  . Family history of adverse reaction to anesthesia    "mother died during bypass surgery but not sure if it has to do with anesthesia"  . Gastric ulcer   . GERD (gastroesophageal reflux disease)   . H/O medication noncompliance    Due to loss of insurance  . Hard of hearing   . Headache 01/25/2016  . Hx of CABG    2000,  / one median sternotomy suture broken her chest x-ray November, 2010, no clinical significance  . Hyperlipidemia   . Hypertension    pt. denies  . Iron deficiency anemia   . Low back pain 06/12/2009   Qualifier: Diagnosis of  By: [name]   . Nausea with vomiting 01/25/2016  . Nephrolithiasis   . OSA (obstructive sleep apnea)   . Pain in limb 06/12/2009   Qualifier: Diagnosis of  By: [name]   . Palpitations    event recorder showed sinus rhythm  . RBBB (right bundle branch block)   . Restless  leg   . Right knee pain 01/07/2015  . Shortness of breath    CPX April, 2011, mild functional limitation, no clear pulmonary or cardiac limitation, possible deconditioning and mild chronotropic incompetence( peak heart rate 130)  . Spondylosis    C5-6, C6-7 MRI 2010  . Syncope 03/2016  . Thyroid disease   . Tubulovillous adenoma of colon 2007  . Wears glasses    Patient Active Problem List   Diagnosis Date Noted  . Atrial fibrillation ([place]) [I48.91] 07/20/2016  . Long term (current) use of anticoagulants [Z79.01] 07/20/2016  . Foraminal stenosis of cervical region 05/31/2016  . Exertional dyspnea 05/14/2016  . Near syncope   . Hypotension  03/13/2016  . Chronic diastolic CHF (congestive heart failure) ([place]) 03/13/2016  . Pre-syncope 03/10/2016  . Confusion 03/10/2016  . Altered mental status   . Diarrhea 02/15/2016  . Nausea with vomiting 01/25/2016  . Headache 01/25/2016  . Carotid artery disease ([place]) 08/01/2015  . Spinal stenosis in cervical region 07/30/2015  . Occipital neuralgia of right side 07/15/2015  . Cervicogenic headache 07/15/2015  . Dizziness   . Chest pain 05/29/2015  . Medicare annual wellness visit, initial 05/11/2015  . Sun-damaged skin 05/11/2015  . Advance care planning 05/01/2015  . Concussion without loss of consciousness 02/18/2015  . Post-traumatic headache 02/18/2015  . Right knee pain 01/07/2015  . Personal history of ongoing treatment with alendronate (Fosamax) 12/20/2013  . HLD (hyperlipidemia) 06/27/2013  . Arthritis of hand, degenerative 06/05/2013  . Acquired atrophy of thyroid 06/05/2013  . Gastro-esophageal reflux disease without esophagitis 06/05/2013  . Clinical depression 06/05/2013  . Arteriosclerosis of coronary artery 05/01/2013  . Benign prostatic hyperplasia with urinary obstruction 05/01/2013  . Abdominal pain, unspecified site 03/06/2013  . Right shoulder pain 10/24/2012  . Eczema 10/24/2012  . Bilateral groin pain 09/21/2012  . Ejection fraction   . Diabetes mellitus type II, uncontrolled ([place]) 08/31/2011  . Coronary artery disease involving native coronary artery   . Anemia   . OSA (obstructive sleep apnea)   . Edema   . Essential hypertension   . Falling episodes   . Hx of CABG   . Palpitations   . Shortness of breath   . Insomnia 04/26/2011  . FATIGUE 12/10/2010  . Hypothyroidism 06/26/2010  . RESTLESS LEG SYNDROME 09/19/2009  . BENIGN POSITIONAL VERTIGO 09/19/2009  . Depression 07/09/2009  . RBBB 07/09/2009  . Low back pain 06/12/2009  . SCIATICA 01/17/2009  . TUBULOVILLOUS ADENOMA, COLON, HX OF 01/08/2009  . Presence of drug coated stent in right  coronary artery 10/29/2008  . NEPHROLITHIASIS, HX OF 12/20/2007    Past Surgical History:  Procedure Laterality Date  . ANTERIOR CERVICAL DECOMP/DISCECTOMY FUSION N/A 05/31/2016   Procedure: ANTERIOR CERVICAL DECOMPRESSION/DISCECTOMY FUSION CERVICAL FIVE-SIX,CERVICAL SIX-SEVEN;  Surgeon: [name], MD;  Location: [facility];  Service: Neurosurgery;  Laterality: N/A;  . CARDIAC CATHETERIZATION N/A 05/30/2015   Procedure: Left Heart Cath and Coronary Angiography;  Surgeon: [name], MD;  Location: [facility];  Service: Cardiovascular;  Laterality: N/A;  . COLONOSCOPY    . CORONARY ARTERY BYPASS GRAFT     2000  . ESOPHAGOGASTRODUODENOSCOPY    . LEFT HEART CATHETERIZATION WITH CORONARY/GRAFT ANGIOGRAM N/A 08/01/2012   Procedure: LEFT HEART CATHETERIZATION WITH Beatrix Fetters;  Surgeon: [name], MD;  Location: [facility] CATH LAB;  Service: Cardiovascular;  Laterality: N/A;  . LEFT HEART CATHETERIZATION WITH CORONARY/GRAFT ANGIOGRAM N/A 01/03/2015   Procedure: LEFT HEART CATHETERIZATION WITH  Beatrix Fetters;  Surgeon: [name], MD;  Location: [facility] CATH LAB;  Service: Cardiovascular;  Laterality: N/A;  . NASAL SEPTUM SURGERY     UP3  . PERCUTANEOUS CORONARY STENT INTERVENTION (PCI-S)  10/2008   mRCA PCI  2.75 x 23 Abbott Xience V drug-eluting stent     Home Medications    Prior to Admission medications   Medication Sig Start Date End Date Taking? Authorizing Provider  amiodarone (PACERONE) 400 MG tablet Take 1 tablet (400 mg total) by mouth daily. 08/23/16   Brittainy Erie Noe, PA-C  atorvastatin (LIPITOR) 40 MG tablet TAKE 1 TABLET BY MOUTH DAILY AT [facility] 04/14/16   Almyra Deforest, PA  baclofen (LIORESAL) 10 MG tablet Take 0.5 tablets (5 mg total) by mouth at bedtime. 03/11/16   Bonnielee Haff, MD  clotrimazole-betamethasone (LOTRISONE) cream Apply 1 application topically 2 (two) times daily.    Historical Provider, MD  cyclobenzaprine (FLEXERIL) 10 MG tablet  Take 1 tablet (10 mg total) by mouth 3 (three) times daily as needed for muscle spasms. 06/01/16   [name], MD  diclofenac sodium (VOLTAREN) 1 % GEL Apply 2 g topically daily as needed (for pain in affected area).    Historical Provider, MD  gabapentin (NEURONTIN) 300 MG capsule Take 2 capsules (600 mg) by mouth  every morning, 1 capsule (300 mg) by mouth  at noon, and 2 capsules (600 mg) by mouth at night    Historical Provider, MD  glucose blood (ACCU-CHEK AVIVA) test strip Use as directed to check blood sugar 4 times daily.  DX E11.9 02/09/16   [name], MD  HYDROcodone-acetaminophen (NORCO/VICODIN) 5-325 MG tablet Take 1-2 tablets by mouth every 4 (four) hours as needed (mild pain). 06/01/16   [name], MD  insulin aspart (NOVOLOG) 100 UNIT/ML injection Inject 10-17 Units into the skin 3 (three) times daily with meals. Use 10-17 unit 3 times daily before meals 04/26/16   [name], MD  Insulin Glargine (TOUJEO SOLOSTAR) 300 UNIT/ML SOPN Inject 42 Units into the skin at bedtime. 04/26/16   [name], MD  isosorbide mononitrate (IMDUR) 120 MG 24 hr tablet Take 120 mg by mouth daily.    Historical Provider, MD  isosorbide mononitrate (IMDUR) 60 MG 24 hr tablet TAKE 2 TABLETS BY MOUTH DAILY 09/09/16   [name], MD  levothyroxine (SYNTHROID, LEVOTHROID) 75 MCG tablet Take 1 tablet (75 mcg total) by mouth daily. 02/09/16   [name], MD  meclizine (ANTIVERT) 12.5 MG tablet Take 1 tablet (12.5 mg total) by mouth 3 (three) times daily as needed for dizziness. 09/24/16   Merrily Pew, MD  metFORMIN (GLUCOPHAGE) 1000 MG tablet TAKE 1 TABLET (1,000 MG TOTAL) BY MOUTH 2 (TWO) TIMES DAILY WITH A MEAL. 05/27/16   [name], MD  metoprolol succinate (TOPROL-XL) 50 MG 24 hr tablet Take 1 and a half tablets once a day for a total of 75 mg. 08/04/16   Brittainy Erie Noe, PA-C  midodrine (PROAMATINE) 10 MG tablet Take 1 tablet (10 mg total) by mouth 3 (three) times daily. 09/06/16   [name], MD  nitroGLYCERIN (NITROSTAT) 0.4 MG SL tablet Place 0.4 mg under the tongue every 5 (five) minutes as needed for chest pain.    Historical Provider, MD  pantoprazole (PROTONIX) 40 MG tablet TAKE 1 TABLET BY MOUTH TWICE DAILY FOR 2 WEEKS THEN DROP TO ONCE DAILY 08/02/16   [name], MD  pramipexole (MIRAPEX) 1  MG tablet Take 1 mg by mouth at bedtime. 03/08/16   Historical Provider, MD  sertraline (ZOLOFT) 100 MG tablet Take 1 tablet (100 mg total) by mouth 2 (two) times daily. 08/03/16   [name], MD  tiZANidine (ZANAFLEX) 4 MG tablet Take 1 tablet (4 mg total) by mouth every 8 (eight) hours as needed for muscle spasms. 11/19/15   Pieter Partridge, DO  topiramate (TOPAMAX) 50 MG tablet TAKE 1 TABLET (50 MG) BY MOUTH AT BEDTIME. 08/02/16   Pieter Partridge, DO  warfarin (COUMADIN) 5 MG tablet Take 1 tablet (5 mg total) by mouth daily. 06/07/16   Sherran Needs, NP   Family History Family History  Problem Relation Age of Onset  . Pancreatic cancer Brother   . Diabetes Brother     4   . Stroke Brother   . Diabetes Mother   . Heart failure Mother   . Heart failure Father   . Cancer Brother   . Coronary artery disease Brother   . Hypothyroidism Brother   . Heart attack      Nephew  . Irregular heart beat Daughter   . Cancer Maternal Grandmother     unknown   . Other Brother     colon surgery   Social History Social History  Substance Use Topics  . Smoking status: Never Smoker  . Smokeless tobacco: Never Used  . Alcohol use No     Comment: stopped drinking in 1998   Allergies   Morphine and Shellfish-derived products  Review of Systems Review of Systems  Constitutional: Negative for fever.  Neurological: Positive for dizziness, speech difficulty, weakness, light-headedness, numbness and headaches.  All other systems reviewed and are negative.  Physical Exam Updated Vital Signs BP 131/60   Pulse 60   Temp 97.5 F (36.4 C)   Resp 17   Ht 5\' 9"  (1.753 m)   Wt  173 lb (78.5 kg)   SpO2 98%   BMI 25.55 kg/m   Physical Exam  Constitutional: He is oriented to person, place, and time. He appears well-developed and well-nourished. No distress.  HENT:  Head: Normocephalic and atraumatic.  Eyes: Conjunctivae are normal.  Cardiovascular: Normal rate and regular rhythm.  Exam reveals no gallop and no friction rub.   No murmur heard. No radial pulse, but a strong ulnar pulse on the left. Right side has normal radial and ulnar pulse. Cap refill is normal   Pulmonary/Chest: Effort normal and breath sounds normal. No respiratory distress. He has no wheezes. He has no rales.  Abdominal: He exhibits no distension.  Neurological: He is alert and oriented to person, place, and time.  Normal cranial nerves; normal upper and lower strength and sensation. Normal bicep and patellar reflex   Skin: Skin is warm and dry.  Psychiatric: He has a normal mood and affect.  Nursing note and vitals reviewed.  ED Treatments / Results  DIAGNOSTIC STUDIES:  Oxygen Saturation is 100% on RA, normal by my interpretation.    COORDINATION OF CARE:  9:31 PM Will order CT head, urinalysis, CMP, and CBC. Discussed treatment plan with pt at bedside and pt agreed to plan.   Labs (all labs ordered are listed, but only abnormal results are displayed) Labs Reviewed  COMPREHENSIVE METABOLIC PANEL - Abnormal; Notable for the following:       Result Value   Creatinine, Ser 1.32 (*)    GFR calc non Af Amer 54 (*)    All other  components within normal limits  CBC WITH DIFFERENTIAL/PLATELET    EKG  EKG Interpretation  Date/Time:  Thursday September 23 2016 22:12:56 EDT Ventricular Rate:  61 PR Interval:    QRS Duration: 138 QT Interval:  467 QTC Calculation: 471 R Axis:   6 Text Interpretation:  Sinus rhythm Right bundle branch block No significant change since last tracing Confirmed by [facility] MD, [name] [phone]) on 09/23/2016 10:58:59 PM Also confirmed by [facility] MD, Delynn Olvera  [phone]), editor [place] CT, Leda Gauze [phone])  on 09/24/2016 7:32:48 AM       Radiology Ct Head Wo Contrast  Result Date: 09/23/2016 CLINICAL DATA:  70 year old male with left occipital headache and slurred speech. EXAM: CT HEAD WITHOUT CONTRAST TECHNIQUE: Contiguous axial images were obtained from the base of the skull through the vertex without intravenous contrast. COMPARISON:  Head CT dated 06/10/2016 FINDINGS: Brain: The ventricles and sulci appropriate in size for patient's age. Minimal periventricular and deep white matter chronic microvascular ischemic changes noted. There is no acute intracranial hemorrhage. No mass effect or midline shift noted. No extra-axial fluid collection. Vascular: No hyperdense vessel or unexpected calcification. Skull: Normal. Negative for fracture or focal lesion. Sinuses/Orbits: Mild mucoperiosteal thickening of the ethmoid air cells. No air-fluid levels. The mastoid air cells are clear. Other: None IMPRESSION: No acute intracranial hemorrhage. Minimal age-related atrophy and chronic microvascular ischemic disease. If symptoms persist and there are no contraindications, MRI may provide better evaluation if clinically indicated Electronically Signed   By: Anner Crete [name]   On: 09/23/2016 23:55    Procedures Procedures (including critical care time)  Medications Ordered in ED Medications  sodium chloride 0.9 % bolus 1,000 mL (0 mLs Intravenous Stopped 09/24/16 0046)  meclizine (ANTIVERT) tablet 25 mg (25 mg Oral Given 09/23/16 2151)     Initial Impression / Assessment and Plan / ED Course  I have reviewed the triage vital signs and the nursing notes.  Pertinent labs & imaging results that were available during my care of the patient were reviewed by me and considered in my medical decision making (see chart for details).  Clinical Course    Headache and dizziness likely related to dehydration. No e/o stroke. No cardiac issues. Feels improved and  closer to baseline after fluids. Will continue to workup with PCP.   Final Clinical Impressions(s) / ED Diagnoses   Final diagnoses:  Nonintractable headache, unspecified chronicity pattern, unspecified headache type  Dehydration  AKI (acute kidney injury) ([place])    New Prescriptions Discharge Medication List as of 09/24/2016 12:09 AM    START taking these medications   Details  meclizine (ANTIVERT) 12.5 MG tablet Take 1 tablet (12.5 mg total) by mouth 3 (three) times daily as needed for dizziness., Starting Fri 09/24/2016, Print       I personally performed the services described in this documentation, which was scribed in my presence. The recorded information has been reviewed and is accurate.     Merrily Pew, MD 09/25/16 [phone]

## 2016-09-23 NOTE — ED Triage Notes (Signed)
Headache. Slurred speech x 3 days. States he has been falling. Hx of stroke last year with left sided weakness.

## 2016-09-24 MED ORDER — MECLIZINE HCL 12.5 MG PO TABS: 12.5000 mg | ORAL_TABLET | Freq: Three times a day (TID) | ORAL | 0 refills | Status: DC | PRN

## 2016-09-27 ENCOUNTER — Ambulatory Visit (INDEPENDENT_AMBULATORY_CARE_PROVIDER_SITE_OTHER): Payer: Commercial Managed Care - HMO | Admitting: Pharmacist Clinician (PhC)/ Clinical Pharmacy Specialist

## 2016-09-27 DIAGNOSIS — I480 Paroxysmal atrial fibrillation: Secondary | ICD-10-CM | POA: Diagnosis not present

## 2016-09-27 DIAGNOSIS — Z5181 Encounter for therapeutic drug level monitoring: Secondary | ICD-10-CM | POA: Diagnosis not present

## 2016-09-27 LAB — POCT INR: INR: 1.2

## 2016-09-29 ENCOUNTER — Telehealth: Payer: Self-pay | Admitting: Family Medicine

## 2016-09-29 NOTE — Telephone Encounter (Signed)
Patient states that the ED doctor recommended to have a kidney test done when he had his labs done next. He is coming in for labs tomorrow @ 9:30am please advise.

## 2016-09-30 ENCOUNTER — Other Ambulatory Visit (INDEPENDENT_AMBULATORY_CARE_PROVIDER_SITE_OTHER): Payer: Commercial Managed Care - HMO

## 2016-09-30 DIAGNOSIS — I10 Essential (primary) hypertension: Secondary | ICD-10-CM

## 2016-09-30 DIAGNOSIS — E1100 Type 2 diabetes mellitus with hyperosmolarity without nonketotic hyperglycemic-hyperosmolar coma (NKHHC): Secondary | ICD-10-CM

## 2016-09-30 DIAGNOSIS — Z794 Long term (current) use of insulin: Secondary | ICD-10-CM | POA: Diagnosis not present

## 2016-09-30 LAB — CBC
HCT: 38.3 % — ABNORMAL LOW (ref 39.0–52.0)
Hemoglobin: 13 g/dL (ref 13.0–17.0)
MCHC: 34.1 g/dL (ref 30.0–36.0)
MCV: 84.3 fl (ref 78.0–100.0)
PLATELETS: 190 10*3/uL (ref 150.0–400.0)
RBC: 4.54 Mil/uL (ref 4.22–5.81)
RDW: 15.7 % — AB (ref 11.5–15.5)
WBC: 7.4 10*3/uL (ref 4.0–10.5)

## 2016-09-30 LAB — LIPID PANEL
CHOL/HDL RATIO: 3
Cholesterol: 78 mg/dL (ref 0–200)
HDL: 30.6 mg/dL — ABNORMAL LOW (ref >39.00)
LDL CALC: 17 mg/dL (ref 0–99)
NONHDL: 47.33
Triglycerides: 153 mg/dL — ABNORMAL HIGH (ref 0.0–149.0)
VLDL: 30.6 mg/dL (ref 0.0–40.0)

## 2016-09-30 LAB — COMPREHENSIVE METABOLIC PANEL
ALT: 17 U/L (ref 0–53)
AST: 17 U/L (ref 0–37)
Albumin: 4.7 g/dL (ref 3.5–5.2)
Alkaline Phosphatase: 70 U/L (ref 39–117)
BILIRUBIN TOTAL: 0.4 mg/dL (ref 0.2–1.2)
BUN: 18 mg/dL (ref 6–23)
CALCIUM: 9.6 mg/dL (ref 8.4–10.5)
CO2: 25 meq/L (ref 19–32)
CREATININE: 1.04 mg/dL (ref 0.40–1.50)
Chloride: 106 mEq/L (ref 96–112)
GFR: 75.47 mL/min (ref >60.00)
GLUCOSE: 123 mg/dL — AB (ref 70–99)
Potassium: 3.7 mEq/L (ref 3.5–5.1)
SODIUM: 142 meq/L (ref 135–145)
Total Protein: 7.3 g/dL (ref 6.0–8.3)

## 2016-09-30 LAB — HEMOGLOBIN A1C: HEMOGLOBIN A1C: 10.2 % — AB (ref 4.6–6.5)

## 2016-09-30 LAB — TSH: TSH: 6 u[IU]/mL — AB (ref 0.35–4.50)

## 2016-09-30 NOTE — Telephone Encounter (Signed)
Renal panel was done today, no concerns.

## 2016-10-01 ENCOUNTER — Other Ambulatory Visit: Payer: Self-pay | Admitting: Family Medicine

## 2016-10-01 DIAGNOSIS — E1165 Type 2 diabetes mellitus with hyperglycemia: Secondary | ICD-10-CM

## 2016-10-01 DIAGNOSIS — E118 Type 2 diabetes mellitus with unspecified complications: Principal | ICD-10-CM

## 2016-10-01 MED ORDER — LEVOTHYROXINE SODIUM 88 MCG PO TABS: 88.0000 ug | ORAL_TABLET | Freq: Every day | ORAL | 3 refills | Status: DC

## 2016-10-01 MED FILL — LEVOTHYROXINE 88 MCG TABLET: 88 | 30 days supply | Qty: 30 | Fill #0

## 2016-10-01 NOTE — Telephone Encounter (Signed)
Patient informed. 

## 2016-10-04 ENCOUNTER — Encounter: Payer: Self-pay | Admitting: Pulmonary Disease

## 2016-10-05 ENCOUNTER — Ambulatory Visit (INDEPENDENT_AMBULATORY_CARE_PROVIDER_SITE_OTHER): Payer: Commercial Managed Care - HMO | Admitting: Pharmacist Clinician (PhC)/ Clinical Pharmacy Specialist

## 2016-10-05 DIAGNOSIS — I480 Paroxysmal atrial fibrillation: Secondary | ICD-10-CM | POA: Diagnosis not present

## 2016-10-05 DIAGNOSIS — Z5181 Encounter for therapeutic drug level monitoring: Secondary | ICD-10-CM | POA: Diagnosis not present

## 2016-10-05 LAB — POCT INR: INR: 3.2

## 2016-10-06 MED FILL — WARFARIN SODIUM 5 MG TABLET: 5 | 30 days supply | Qty: 30 | Fill #3

## 2016-10-07 MED FILL — metFORMIN HCL 1000 MG TABS: 1000 | 30 days supply | Qty: 60 | Fill #3

## 2016-10-07 MED FILL — TOUJEO SOLOSTAR 300 UNITS/M: 300 | 32 days supply | Qty: 5 | Fill #2

## 2016-10-12 ENCOUNTER — Ambulatory Visit: Payer: Commercial Managed Care - HMO | Admitting: Neurology

## 2016-10-15 ENCOUNTER — Telehealth: Payer: Self-pay | Admitting: Pulmonary Disease

## 2016-10-15 NOTE — Telephone Encounter (Signed)
   DL for 30 days : 67%, AHI 7.8. Looking at DL > pt with leak issues contributing to elevated AHI.   Pt has f/u with me next week > will discuss with him.  [name], MD 10/15/2016, 7:30 AM [facility] Pager [phone] After 3 pm or if no answer, call [phone]

## 2016-10-18 ENCOUNTER — Encounter: Payer: Self-pay | Admitting: Pulmonary Disease

## 2016-10-18 ENCOUNTER — Encounter: Payer: Self-pay | Admitting: Neurology

## 2016-10-20 ENCOUNTER — Ambulatory Visit: Payer: Commercial Managed Care - HMO | Admitting: Pulmonary Disease

## 2016-10-21 ENCOUNTER — Telehealth: Payer: Self-pay | Admitting: Pulmonary Disease

## 2016-10-21 NOTE — Telephone Encounter (Signed)
DL the last month :  73%. AHI 5.1. autocpap 5-15 cm water.  Jasmine : pls tell pt he needs to be seen by me or NP for f/u on cpap. Thanks. (insurance will require it).    [name], MD 10/21/2016, 8:44 AM [name] Pager [phone] After 3 pm or if no answer, call [phone]

## 2016-10-25 NOTE — Telephone Encounter (Signed)
Called pt. But no vm is set up yet

## 2016-10-27 ENCOUNTER — Ambulatory Visit (INDEPENDENT_AMBULATORY_CARE_PROVIDER_SITE_OTHER): Payer: Commercial Managed Care - HMO | Admitting: *Deleted

## 2016-10-27 DIAGNOSIS — I480 Paroxysmal atrial fibrillation: Secondary | ICD-10-CM | POA: Diagnosis not present

## 2016-10-27 DIAGNOSIS — Z5181 Encounter for therapeutic drug level monitoring: Secondary | ICD-10-CM

## 2016-10-27 LAB — POCT INR: INR: 2

## 2016-10-29 NOTE — Telephone Encounter (Signed)
Tried to call pt. But no vm set up yet  x2

## 2016-11-01 ENCOUNTER — Ambulatory Visit: Payer: Commercial Managed Care - HMO | Admitting: Family Medicine

## 2016-11-02 ENCOUNTER — Other Ambulatory Visit (HOSPITAL_COMMUNITY): Payer: Self-pay | Admitting: Nurse Practitioner

## 2016-11-02 ENCOUNTER — Encounter: Payer: Self-pay | Admitting: Family Medicine

## 2016-11-02 ENCOUNTER — Ambulatory Visit (INDEPENDENT_AMBULATORY_CARE_PROVIDER_SITE_OTHER): Payer: Commercial Managed Care - HMO | Admitting: Family Medicine

## 2016-11-02 ENCOUNTER — Encounter: Payer: Self-pay | Admitting: Pulmonary Disease

## 2016-11-02 DIAGNOSIS — I10 Essential (primary) hypertension: Secondary | ICD-10-CM

## 2016-11-02 DIAGNOSIS — E782 Mixed hyperlipidemia: Secondary | ICD-10-CM

## 2016-11-02 DIAGNOSIS — E039 Hypothyroidism, unspecified: Secondary | ICD-10-CM

## 2016-11-02 DIAGNOSIS — R519 Headache, unspecified: Secondary | ICD-10-CM

## 2016-11-02 DIAGNOSIS — E1100 Type 2 diabetes mellitus with hyperosmolarity without nonketotic hyperglycemic-hyperosmolar coma (NKHHC): Secondary | ICD-10-CM

## 2016-11-02 DIAGNOSIS — R51 Headache: Secondary | ICD-10-CM

## 2016-11-02 DIAGNOSIS — Z794 Long term (current) use of insulin: Secondary | ICD-10-CM

## 2016-11-02 MED FILL — TOPIRAMATE 50 MG TABLET: 50 | 30 days supply | Qty: 30 | Fill #2

## 2016-11-02 MED FILL — CYCLOBENZAPRINE 10 MG TAB: 10 | 10 days supply | Qty: 30 | Fill #1

## 2016-11-02 MED FILL — LEVOTHYROXINE 88 MCG TABLET: 88 | 30 days supply | Qty: 30 | Fill #1

## 2016-11-02 MED FILL — metFORMIN HCL 1000 MG TABS: 1000 | 30 days supply | Qty: 60 | Fill #4

## 2016-11-02 MED FILL — PRAMIPEXOLE 1 MG TABLET: 1 | 30 days supply | Qty: 30 | Fill #1

## 2016-11-02 MED FILL — METOPROLOL SUCC ER 50 MG TA: 50 | 30 days supply | Qty: 45 | Fill #1

## 2016-11-02 MED FILL — WARFARIN SODIUM 5 MG TABLET: 5 | 30 days supply | Qty: 35 | Fill #0

## 2016-11-02 MED FILL — MIDODRINE HCL 10 MG TABLET: 10 | 30 days supply | Qty: 90 | Fill #1

## 2016-11-02 MED FILL — PANTOPRAZOLE SOD DR 40 MG T: 40 | 45 days supply | Qty: 45 | Fill #1

## 2016-11-02 MED FILL — ATORVASTATIN 40 MG TABLET: 40 | 30 days supply | Qty: 30 | Fill #1

## 2016-11-02 NOTE — Telephone Encounter (Signed)
Spoke with pt. Wife and she stated she will have him call in tomorrow to get another appt. With either AD or a NP. Nothing further is needed at this time.

## 2016-11-02 NOTE — Patient Instructions (Signed)

## 2016-11-02 NOTE — Progress Notes (Signed)
Pre visit review using our clinic review tool, if applicable. No additional management support is needed unless otherwise documented below in the visit note. 

## 2016-11-03 ENCOUNTER — Encounter: Payer: Self-pay | Admitting: Pulmonary Disease

## 2016-11-03 ENCOUNTER — Ambulatory Visit (INDEPENDENT_AMBULATORY_CARE_PROVIDER_SITE_OTHER): Payer: Commercial Managed Care - HMO | Admitting: Pulmonary Disease

## 2016-11-03 DIAGNOSIS — G4733 Obstructive sleep apnea (adult) (pediatric): Secondary | ICD-10-CM

## 2016-11-03 DIAGNOSIS — G2581 Restless legs syndrome: Secondary | ICD-10-CM | POA: Diagnosis not present

## 2016-11-03 NOTE — Patient Instructions (Signed)
  It was a pleasure taking care of you today!  Continue using your CPAP machine.   Please make sure you use your CPAP device everytime you sleep.  We will monitor the usage of your machine per your insurance requirement.  Your insurance company may take the machine from you if you are not using it regularly.   Please clean the mask, tubings, filter, water reservoir with soapy water every week.  Please use distilled water for the water reservoir.   Please call the office or your machine provider (DME company) if you are having issues with the device.   Return to clinic in 6 months  with Dr. De Dios    

## 2016-11-03 NOTE — Progress Notes (Signed)
Subjective:    Patient ID: [name] [name], male    DOB: October 13, 1946, 70 y.o.   MRN: [id number]  HPI   This is the case of [name] [name], 70 y.o. Male, who was referred by Dr. Penni Homans in consultation regarding OSA.   As you very well know, patient is a  non smoker, no copd or asthma by history, was dxed with OSA in 2000.  Unsure severity. Used cpap for < 1 month. He stopped using it 2/2 too much pressure.   He had nasal/mouth surgery in 2005 which helped with snoring. Snoring has been back x 5 yrs.   Has hypersomnia, he can sleep all day.  Has snoring, witnessed apneas, gasps, choking.  Moves his legs frequently at sleep/ Has sleep talking. No other abn behavior in sleep.   Sleeps 4-5 hrs at night.  Naps daily.  Feels unrefreshed when he wakes up in am.   Pt also has CAD. Currently on a 1 month holter.   Not on o2.   No recent hospitalization or infection.    ROV  11/03/16 Patient returns to the office as follow-up on his sleep apnea. Since last seen, he had issues with the mask but that has improved. He had a lab  sleep study which showed AHI of 14. He was started on auto CPAP (September 2017). Download the last month: 90%, AHI 4. Feels better using it. More energy. Less sleepiness. Feels benefit of cpap.  RLS is the same> tried to wean off meds but RLS worsened.    Review of Systems  Constitutional: Negative.  Negative for fever and unexpected weight change.  HENT: Negative for congestion, dental problem, ear pain, nosebleeds, postnasal drip, rhinorrhea, sinus pressure, sneezing, sore throat and trouble swallowing.   Eyes: Negative.  Negative for redness and itching.  Respiratory: Positive for shortness of breath. Negative for cough, chest tightness and wheezing.   Cardiovascular: Positive for palpitations. Negative for leg swelling.  Gastrointestinal: Negative.  Negative for nausea and vomiting.  Endocrine: Negative.   Genitourinary: Negative.  Negative for  dysuria.  Musculoskeletal: Positive for arthralgias and neck pain. Negative for joint swelling.  Skin: Negative.  Negative for rash.  Allergic/Immunologic: Negative.   Neurological: Positive for dizziness, light-headedness and headaches.  Hematological: Bruises/bleeds easily.  Psychiatric/Behavioral: Negative.  Negative for dysphoric mood. The patient is not nervous/anxious.    Past Medical History:  Diagnosis Date  . ACP (advance care planning) 05/11/2015  . Advance care planning 05/01/2015  . Anemia    hemoglobin 7.4, iron deficiency, January, 2011, 2 unit transfusion, endoscopy normal, capsule endoscopy February, 2011 no small bowel abnormalities.   Most likely source gastric erosions, followed by GI  . Anxiety   . CAD (coronary artery disease)    A. CABG in 2000,status post cardiac cath in 2006, 2009 ....continued chest pain and SOB despite oral medication adjestments including Ranexa. B. Cath November 2009/ mRCA - 2.75 x 23 Abbott Xience V drug-eluting stent ...11/26/2008 to distal  RCA leading to acute marginal.  C. Cath 07/2012 for CP - stable anatomy, med rx. d. cath 2015 and 05/30/2015 stable anatomy, consider Myoview if has CP again  . Cerebral ischemia    MRI November, 2010, chronic microvascular ischemia  . Chronic diastolic CHF (congestive heart failure) ([place]) 03/13/2016  . Depression    Bipolar  . Diabetes mellitus type II, uncontrolled ([place])   . Diabetes mellitus without complication ([place])   . Diarrhea 02/15/2016  . Dizziness   .  Edema   . Ejection fraction    EF 60%, echo, July 31, 2012  . Falling episodes    these have occurred in the past and again recurring 2011  . Family history of adverse reaction to anesthesia    "mother died during bypass surgery but not sure if it has to do with anesthesia"  . Gastric ulcer   . GERD (gastroesophageal reflux disease)   . H/O medication noncompliance    Due to loss of insurance  . Hard of hearing   . Headache 01/25/2016  . Hx of  CABG    2000,  / one median sternotomy suture broken her chest x-ray November, 2010, no clinical significance  . Hyperlipidemia   . Hypertension    pt. denies  . Iron deficiency anemia   . Low back pain 06/12/2009   Qualifier: Diagnosis of  By: [name]   . Nausea with vomiting 01/25/2016  . Nephrolithiasis   . OSA (obstructive sleep apnea)   . Pain in limb 06/12/2009   Qualifier: Diagnosis of  By: [name]   . Palpitations    event recorder showed sinus rhythm  . RBBB (right bundle branch block)   . Restless leg   . Right knee pain 01/07/2015  . Shortness of breath    CPX April, 2011, mild functional limitation, no clear pulmonary or cardiac limitation, possible deconditioning and mild chronotropic incompetence( peak heart rate 130)  . Spondylosis    C5-6, C6-7 MRI 2010  . Syncope 03/2016  . Thyroid disease   . Tubulovillous adenoma of colon 2007  . Wears glasses    (-) CA, DVT  Family History  Problem Relation Age of Onset  . Pancreatic cancer Brother   . Diabetes Brother     4   . Stroke Brother   . Diabetes Mother   . Heart failure Mother   . Heart failure Father   . Cancer Brother   . Coronary artery disease Brother   . Hypothyroidism Brother   . Heart attack      Nephew  . Irregular heart beat Daughter   . Cancer Maternal Grandmother     unknown   . Other Brother     colon surgery     Past Surgical History:  Procedure Laterality Date  . ANTERIOR CERVICAL DECOMP/DISCECTOMY FUSION N/A 05/31/2016   Procedure: ANTERIOR CERVICAL DECOMPRESSION/DISCECTOMY FUSION CERVICAL FIVE-SIX,CERVICAL SIX-SEVEN;  Surgeon: [name], MD;  Location: [facility];  Service: Neurosurgery;  Laterality: N/A;  . CARDIAC CATHETERIZATION N/A 05/30/2015   Procedure: Left Heart Cath and Coronary Angiography;  Surgeon: [name], MD;  Location: [facility];  Service: Cardiovascular;  Laterality: N/A;  . COLONOSCOPY    . CORONARY ARTERY BYPASS GRAFT     2000  .  ESOPHAGOGASTRODUODENOSCOPY    . LEFT HEART CATHETERIZATION WITH CORONARY/GRAFT ANGIOGRAM N/A 08/01/2012   Procedure: LEFT HEART CATHETERIZATION WITH Beatrix Fetters;  Surgeon: [name], MD;  Location: [facility];  Service: Cardiovascular;  Laterality: N/A;  . LEFT HEART CATHETERIZATION WITH CORONARY/GRAFT ANGIOGRAM N/A 01/03/2015   Procedure: LEFT HEART CATHETERIZATION WITH Beatrix Fetters;  Surgeon: [name], MD;  Location: [facility] CATH LAB;  Service: Cardiovascular;  Laterality: N/A;  . NASAL SEPTUM SURGERY     UP3  . PERCUTANEOUS CORONARY STENT INTERVENTION (PCI-S)  10/2008   mRCA PCI  2.75 x 23 Abbott Xience V drug-eluting stent     Social History  Social History  . Marital status: Married    Spouse name: N/A  . Number of children: 4  . Years of education: 23   Occupational History  . retired    Social History Main Topics  . Smoking status: Never Smoker  . Smokeless tobacco: Never Used  . Alcohol use No     Comment: stopped drinking in 1998  . Drug use: No  . Sexual activity: Yes    Partners: Female   Other Topics Concern  . Not on file   Social History Narrative   Patient is right handed.   Patient does not drink caffeine.   Married with 4 daughters. Worked in a [employer]. (-) ETOH.    Allergies  Allergen Reactions  . Morphine Other (See Comments)    hallucinations  . Shellfish-Derived Products Itching     Outpatient Medications Prior to Visit  Medication Sig Dispense Refill  . amiodarone (PACERONE) 400 MG tablet Take 1 tablet (400 mg total) by mouth daily. 30 tablet 0  . atorvastatin (LIPITOR) 40 MG tablet TAKE 1 TABLET BY MOUTH DAILY AT 6PM 30 tablet 5  . clotrimazole-betamethasone (LOTRISONE) cream Apply 1 application topically 2 (two) times daily.    . cyclobenzaprine (FLEXERIL) 10 MG tablet Take 1 tablet (10 mg total) by mouth 3 (three) times daily as needed for muscle spasms. 30 tablet 0  . diclofenac sodium (VOLTAREN)  1 % GEL Apply 2 g topically daily as needed (for pain in affected area).    . gabapentin (NEURONTIN) 300 MG capsule Take 2 capsules (600 mg) by mouth  every morning, 1 capsule (300 mg) by mouth  at noon, and 2 capsules (600 mg) by mouth at night    . glucose blood (ACCU-CHEK AVIVA) test strip Use as directed to check blood sugar 4 times daily.  DX E11.9 200 each 6  . HYDROcodone-acetaminophen (NORCO/VICODIN) 5-325 MG tablet Take 1-2 tablets by mouth every 4 (four) hours as needed (mild pain). 80 tablet 0  . insulin aspart (NOVOLOG) 100 UNIT/ML injection Inject 10-17 Units into the skin 3 (three) times daily with meals. Use 10-17 unit 3 times daily before meals 15 mL 4  . Insulin Glargine (TOUJEO SOLOSTAR) 300 UNIT/ML SOPN Inject 42 Units into the skin at bedtime. (Patient taking differently: Inject 48 Units into the skin at bedtime. ) 3 pen 4  . isosorbide mononitrate (IMDUR) 120 MG 24 hr tablet Take 120 mg by mouth daily.    . isosorbide mononitrate (IMDUR) 60 MG 24 hr tablet TAKE 2 TABLETS BY MOUTH DAILY 180 tablet 0  . levothyroxine (SYNTHROID, LEVOTHROID) 88 MCG tablet Take 1 tablet (88 mcg total) by mouth daily before breakfast. 30 tablet 3  . meclizine (ANTIVERT) 12.5 MG tablet Take 1 tablet (12.5 mg total) by mouth 3 (three) times daily as needed for dizziness. 10 tablet 0  . metFORMIN (GLUCOPHAGE) 1000 MG tablet TAKE 1 TABLET (1,000 MG TOTAL) BY MOUTH 2 (TWO) TIMES DAILY WITH A MEAL. 60 tablet 5  . metoprolol succinate (TOPROL-XL) 50 MG 24 hr tablet Take 1 and a half tablets once a day for a total of 75 mg. 45 tablet 6  . midodrine (PROAMATINE) 10 MG tablet Take 1 tablet (10 mg total) by mouth 3 (three) times daily. 90 tablet 3  . nitroGLYCERIN (NITROSTAT) 0.4 MG SL tablet Place 0.4 mg under the tongue every 5 (five) minutes as needed for chest pain.    . pantoprazole (PROTONIX) 40 MG tablet TAKE 1  TABLET BY MOUTH TWICE DAILY FOR 2 WEEKS THEN DROP TO ONCE DAILY 45 tablet 5  . pramipexole  (MIRAPEX) 1 MG tablet Take 1 mg by mouth at bedtime.    . sertraline (ZOLOFT) 100 MG tablet Take 1 tablet (100 mg total) by mouth 2 (two) times daily. 180 tablet 1  . tiZANidine (ZANAFLEX) 4 MG tablet Take 1 tablet (4 mg total) by mouth every 8 (eight) hours as needed for muscle spasms. 90 tablet 2  . topiramate (TOPAMAX) 50 MG tablet TAKE 1 TABLET (50 MG) BY MOUTH AT BEDTIME. 30 tablet 3  . warfarin (COUMADIN) 5 MG tablet Take as directed by [facility] 35 tablet 3   No facility-administered medications prior to visit.    No orders of the defined types were placed in this encounter.          Objective:   Physical Exam  Vitals:  Vitals:   11/03/16 1623  BP: 120/60  Pulse: 84  SpO2: 97%  Weight: 173 lb 9.6 oz (78.7 kg)  Height: 5\' 9"  (1.753 m)    Constitutional/General:  Pleasant, well-nourished, well-developed, not in any distress,  Comfortably seating.  Well kempt  Body mass index is 25.64 kg/m. Wt Readings from Last 3 Encounters:  11/03/16 173 lb 9.6 oz (78.7 kg)  11/02/16 173 lb 9.6 oz (78.7 kg)  09/23/16 173 lb (78.5 kg)    Neck circumference: 16 in  HEENT: Pupils equal and reactive to light and accommodation. Anicteric sclerae. Normal nasal mucosa.   No oral  lesions,  mouth clear,  oropharynx clear, no postnasal drip. (-) Oral thrush. No dental caries. S/P UP3 Airway - Mallampati class III  Neck: No masses. Midline trachea. No JVD, (-) LAD. (-) bruits appreciated.  Respiratory/Chest: Grossly normal chest. (-) deformity. (-) Accessory muscle use.  Symmetric expansion. (-) Tenderness on palpation.  Resonant on percussion.  Diminished BS on both lower lung zones. (-) wheezing, rhonchi. Some crackles at bases.  (-) egophony  Cardiovascular: Regular rate and  rhythm, heart sounds normal, no murmur or gallops, no peripheral edema  Gastrointestinal:  Normal bowel sounds. Soft, non-tender. No hepatosplenomegaly.  (-) masses.   Musculoskeletal:  Normal  muscle tone. Normal gait.   Extremities: Grossly normal. (-) clubbing, cyanosis.  (-) edema  Skin: (-) rash,lesions seen.   Neurological/Psychiatric : alert, oriented to time, place, person. Normal mood and affect            Assessment & Plan:  RESTLESS LEG SYNDROME Cont Mirapex. Still have leg issues. Rx osa and observe RLS.   OSA (obstructive sleep apnea) Pt was dxed with OSA in 2000.  Unsure severity. Used cpap for < 1 month. He stopped using it 2/2 too much pressure.  He had nasal/mouth surgery in 2005 which helped with snoring. Snoring has been back x 5 yrs.   Has hypersomnia, he can sleep all day.  Has snoring, witnessed apneas, gasps, choking.  Moves his legs frequently at sleep/ Has sleep talking. No other abn behavior in sleep.   Sleeps 4-5 hrs at night.  Naps daily.  Feels unrefreshed when he wakes up in am.   Pt also has CAD  PSG in 08/2016 showed AHI 14. On autocpap 5-15 and he feels better. More energy. Less sleepiness.  Feels benefit of cpap.  DL last month : 90%, AHI 4.   Plan:   We extensively discussed the importance of treating OSA and the need to use PAP therapy.   Continue with autocpap  5-15 cm water. No issues.    Patient was instructed to have mask, tubings, filter, reservoir cleaned at least once a week with soapy water.  Patient was instructed to call the office if he/she is having issues with the PAP device.    I advised patient to obtain sufficient amount of sleep --  7 to 8 hours at least in a 24 hr period.  Patient was advised to follow good sleep hygiene.  Patient was advised NOT to engage in activities requiring concentration and/or vigilance if he/she is and  sleepy.  Patient is NOT to drive if he/she is sleepy.    Patient will follow up with me in 6 months                        Clara, MD 11/03/2016   11:00 PM [facility] [place] Pager: [phone] Office: [phone], Fax: [phone]

## 2016-11-03 NOTE — Assessment & Plan Note (Addendum)
Pt was dxed with OSA in 2000.  Unsure severity. Used cpap for < 1 month. He stopped using it 2/2 too much pressure.  He had nasal/mouth surgery in 2005 which helped with snoring. Snoring has been back x 5 yrs.   Has hypersomnia, he can sleep all day.  Has snoring, witnessed apneas, gasps, choking.  Moves his legs frequently at sleep/ Has sleep talking. No other abn behavior in sleep.   Sleeps 4-5 hrs at night.  Naps daily.  Feels unrefreshed when he wakes up in am.   Pt also has CAD  PSG in 08/2016 showed AHI 14. On autocpap 5-15 and he feels better. More energy. Less sleepiness.  Feels benefit of cpap.  DL last month : 90%, AHI 4.   Plan:   We extensively discussed the importance of treating OSA and the need to use PAP therapy.   Continue with autocpap 5-15 cm water. No issues.    Patient was instructed to have mask, tubings, filter, reservoir cleaned at least once a week with soapy water.  Patient was instructed to call the office if he/she is having issues with the PAP device.    I advised patient to obtain sufficient amount of sleep --  7 to 8 hours at least in a 24 hr period.  Patient was advised to follow good sleep hygiene.  Patient was advised NOT to engage in activities requiring concentration and/or vigilance if he/she is and  sleepy.  Patient is NOT to drive if he/she is sleepy.

## 2016-11-03 NOTE — Assessment & Plan Note (Signed)
Cont Mirapex. Still have leg issues. Rx osa and observe RLS.

## 2016-11-04 ENCOUNTER — Other Ambulatory Visit: Payer: Self-pay

## 2016-11-04 DIAGNOSIS — G4733 Obstructive sleep apnea (adult) (pediatric): Secondary | ICD-10-CM

## 2016-11-12 ENCOUNTER — Ambulatory Visit: Payer: Commercial Managed Care - HMO | Admitting: Internal Medicine

## 2016-11-12 DIAGNOSIS — Z0289 Encounter for other administrative examinations: Secondary | ICD-10-CM

## 2016-11-14 NOTE — Assessment & Plan Note (Signed)
Encouraged heart healthy diet, increase exercise, avoid trans fats, consider a krill oil cap daily 

## 2016-11-14 NOTE — Assessment & Plan Note (Addendum)
Continues to struggle with severe headaches and no treatment has been successful. Encouraged increased hydration, 64 ounces of clear fluids daily. Minimize alcohol and caffeine. Eat small frequent meals with lean proteins and complex carbs. Avoid high and low blood sugars. Get adequate sleep, 7-8 hours a night. Needs exercise daily preferably in the morning. Is referred to neurology for further consideration. He does note that his headache was improved after his last spinal surgery but has now returned.

## 2016-11-14 NOTE — Assessment & Plan Note (Signed)
Following with endocrinology and improving. No changes today

## 2016-11-14 NOTE — Assessment & Plan Note (Signed)
Well controlled, no changes to meds. Encouraged heart healthy diet such as the DASH diet and exercise as tolerated.  °

## 2016-11-14 NOTE — Progress Notes (Signed)
Patient ID: [name] [name], male   DOB: January 12, 1946, 70 y.o.   MRN: [id number]   Subjective:    Patient ID: [name] [name], male    DOB: 1946-10-19, 70 y.o.   MRN: [id number]  Chief Complaint  Patient presents with  . Follow-up    HPI Patient is in today for follow up. He is accompanied by his wife. They are worried about his worsening headaches. He notes his headaches were nearly resolved after his last spinal surgery but have now returned. So far work up has been negative but the headahces will not resolve. He also endorses left arm pain with some numbness as well. No falls or injury. He notes fatigue and anxiety over headaches as well. No photophobia or phonophobia. Denies CP/palp/SOB/HA/congestion/fevers/GI or GU c/o. Taking meds as prescribed  Past Medical History:  Diagnosis Date  . ACP (advance care planning) 05/11/2015  . Advance care planning 05/01/2015  . Anemia    hemoglobin 7.4, iron deficiency, January, 2011, 2 unit transfusion, endoscopy normal, capsule endoscopy February, 2011 no small bowel abnormalities.   Most likely source gastric erosions, followed by GI  . Anxiety   . CAD (coronary artery disease)    A. CABG in 2000,status post cardiac cath in 2006, 2009 ....continued chest pain and SOB despite oral medication adjestments including Ranexa. B. Cath November 2009/ mRCA - 2.75 x 23 Abbott Xience V drug-eluting stent ...11/26/2008 to distal  RCA leading to acute marginal.  C. Cath 07/2012 for CP - stable anatomy, med rx. d. cath 2015 and 05/30/2015 stable anatomy, consider Myoview if has CP again  . Cerebral ischemia    MRI November, 2010, chronic microvascular ischemia  . Chronic diastolic CHF (congestive heart failure) ([place]) 03/13/2016  . Depression    Bipolar  . Diabetes mellitus type II, uncontrolled ([place])   . Diabetes mellitus without complication ([place])   . Diarrhea 02/15/2016  . Dizziness   . Edema   . Ejection fraction    EF 60%, echo, July 31, 2012  . Falling  episodes    these have occurred in the past and again recurring 2011  . Family history of adverse reaction to anesthesia    "mother died during bypass surgery but not sure if it has to do with anesthesia"  . Gastric ulcer   . GERD (gastroesophageal reflux disease)   . H/O medication noncompliance    Due to loss of insurance  . Hard of hearing   . Headache 01/25/2016  . Hx of CABG    2000,  / one median sternotomy suture broken her chest x-ray November, 2010, no clinical significance  . Hyperlipidemia   . Hypertension    pt. denies  . Iron deficiency anemia   . Low back pain 06/12/2009   Qualifier: Diagnosis of  By: [name]   . Nausea with vomiting 01/25/2016  . Nephrolithiasis   . OSA (obstructive sleep apnea)   . Pain in limb 06/12/2009   Qualifier: Diagnosis of  By: [name]   . Palpitations    event recorder showed sinus rhythm  . RBBB (right bundle branch block)   . Restless leg   . Right knee pain 01/07/2015  . Shortness of breath    CPX April, 2011, mild functional limitation, no clear pulmonary or cardiac limitation, possible deconditioning and mild chronotropic incompetence( peak heart rate 130)  . Spondylosis    C5-6, C6-7 MRI 2010  . Syncope 03/2016  . Thyroid disease   .  Tubulovillous adenoma of colon 2007  . Wears glasses     Past Surgical History:  Procedure Laterality Date  . ANTERIOR CERVICAL DECOMP/DISCECTOMY FUSION N/A 05/31/2016   Procedure: ANTERIOR CERVICAL DECOMPRESSION/DISCECTOMY FUSION CERVICAL FIVE-SIX,CERVICAL SIX-SEVEN;  Surgeon: [name], MD;  Location: [facility];  Service: Neurosurgery;  Laterality: N/A;  . CARDIAC CATHETERIZATION N/A 05/30/2015   Procedure: Left Heart Cath and Coronary Angiography;  Surgeon: [name], MD;  Location: [facility];  Service: Cardiovascular;  Laterality: N/A;  . COLONOSCOPY    . CORONARY ARTERY BYPASS GRAFT     2000  . ESOPHAGOGASTRODUODENOSCOPY    . LEFT HEART CATHETERIZATION WITH  CORONARY/GRAFT ANGIOGRAM N/A 08/01/2012   Procedure: LEFT HEART CATHETERIZATION WITH Beatrix Fetters;  Surgeon: [name], MD;  [facility];  Service: Cardiovascular;  Laterality: N/A;  . LEFT HEART CATHETERIZATION WITH CORONARY/GRAFT ANGIOGRAM N/A 01/03/2015   Procedure: LEFT HEART CATHETERIZATION WITH Beatrix Fetters;  Surgeon: [name], MD;  Location: [facility] CATH LAB;  Service: Cardiovascular;  Laterality: N/A;  . NASAL SEPTUM SURGERY     UP3  . PERCUTANEOUS CORONARY STENT INTERVENTION (PCI-S)  10/2008   mRCA PCI  2.75 x 23 Abbott Xience V drug-eluting stent     Family History  Problem Relation Age of Onset  . Pancreatic cancer Brother   . Diabetes Brother     4   . Stroke Brother   . Diabetes Mother   . Heart failure Mother   . Heart failure Father   . Cancer Brother   . Coronary artery disease Brother   . Hypothyroidism Brother   . Heart attack      Nephew  . Irregular heart beat Daughter   . Cancer Maternal Grandmother     unknown   . Other Brother     colon surgery    Social History   Social History  . Marital status: Married    Spouse name: N/A  . Number of children: 4  . Years of education: 75   Occupational History  . retired    Social History Main Topics  . Smoking status: Never Smoker  . Smokeless tobacco: Never Used  . Alcohol use No     Comment: stopped drinking in 1998  . Drug use: No  . Sexual activity: Yes    Partners: Female   Other Topics Concern  . Not on file   Social History Narrative   Patient is right handed.   Patient does not drink caffeine.    Outpatient Medications Prior to Visit  Medication Sig Dispense Refill  . amiodarone (PACERONE) 400 MG tablet Take 1 tablet (400 mg total) by mouth daily. 30 tablet 0  . atorvastatin (LIPITOR) 40 MG tablet TAKE 1 TABLET BY MOUTH DAILY AT 6PM 30 tablet 5  . clotrimazole-betamethasone (LOTRISONE) cream Apply 1 application topically 2 (two) times daily.      . cyclobenzaprine (FLEXERIL) 10 MG tablet Take 1 tablet (10 mg total) by mouth 3 (three) times daily as needed for muscle spasms. 30 tablet 0  . diclofenac sodium (VOLTAREN) 1 % GEL Apply 2 g topically daily as needed (for pain in affected area).    . gabapentin (NEURONTIN) 300 MG capsule Take 2 capsules (600 mg) by mouth  every morning, 1 capsule (300 mg) by mouth  at noon, and 2 capsules (600 mg) by mouth at night    . glucose blood (ACCU-CHEK AVIVA) test strip Use as directed to check blood  sugar 4 times daily.  DX E11.9 200 each 6  . HYDROcodone-acetaminophen (NORCO/VICODIN) 5-325 MG tablet Take 1-2 tablets by mouth every 4 (four) hours as needed (mild pain). 80 tablet 0  . insulin aspart (NOVOLOG) 100 UNIT/ML injection Inject 10-17 Units into the skin 3 (three) times daily with meals. Use 10-17 unit 3 times daily before meals 15 mL 4  . Insulin Glargine (TOUJEO SOLOSTAR) 300 UNIT/ML SOPN Inject 42 Units into the skin at bedtime. (Patient taking differently: Inject 48 Units into the skin at bedtime. ) 3 pen 4  . isosorbide mononitrate (IMDUR) 120 MG 24 hr tablet Take 120 mg by mouth daily.    . isosorbide mononitrate (IMDUR) 60 MG 24 hr tablet TAKE 2 TABLETS BY MOUTH DAILY 180 tablet 0  . levothyroxine (SYNTHROID, LEVOTHROID) 88 MCG tablet Take 1 tablet (88 mcg total) by mouth daily before breakfast. 30 tablet 3  . meclizine (ANTIVERT) 12.5 MG tablet Take 1 tablet (12.5 mg total) by mouth 3 (three) times daily as needed for dizziness. 10 tablet 0  . metFORMIN (GLUCOPHAGE) 1000 MG tablet TAKE 1 TABLET (1,000 MG TOTAL) BY MOUTH 2 (TWO) TIMES DAILY WITH A MEAL. 60 tablet 5  . metoprolol succinate (TOPROL-XL) 50 MG 24 hr tablet Take 1 and a half tablets once a day for a total of 75 mg. 45 tablet 6  . midodrine (PROAMATINE) 10 MG tablet Take 1 tablet (10 mg total) by mouth 3 (three) times daily. 90 tablet 3  . nitroGLYCERIN (NITROSTAT) 0.4 MG SL tablet Place 0.4 mg under the tongue every 5 (five)  minutes as needed for chest pain.    . pantoprazole (PROTONIX) 40 MG tablet TAKE 1 TABLET BY MOUTH TWICE DAILY FOR 2 WEEKS THEN DROP TO ONCE DAILY 45 tablet 5  . pramipexole (MIRAPEX) 1 MG tablet Take 1 mg by mouth at bedtime.    . sertraline (ZOLOFT) 100 MG tablet Take 1 tablet (100 mg total) by mouth 2 (two) times daily. 180 tablet 1  . tiZANidine (ZANAFLEX) 4 MG tablet Take 1 tablet (4 mg total) by mouth every 8 (eight) hours as needed for muscle spasms. 90 tablet 2  . topiramate (TOPAMAX) 50 MG tablet TAKE 1 TABLET (50 MG) BY MOUTH AT BEDTIME. 30 tablet 3  . warfarin (COUMADIN) 5 MG tablet Take as directed by [facility] 35 tablet 3  . baclofen (LIORESAL) 10 MG tablet Take 0.5 tablets (5 mg total) by mouth at bedtime. 30 each 0   No facility-administered medications prior to visit.     Allergies  Allergen Reactions  . Morphine Other (See Comments)    hallucinations  . Shellfish-Derived Products Itching    Review of Systems  Constitutional: Positive for malaise/fatigue. Negative for fever.  HENT: Negative for congestion.   Eyes: Negative for blurred vision.  Respiratory: Negative for shortness of breath.   Cardiovascular: Negative for chest pain, palpitations and leg swelling.  Gastrointestinal: Negative for abdominal pain, blood in stool and nausea.  Genitourinary: Negative for dysuria and frequency.  Musculoskeletal: Positive for neck pain. Negative for falls.  Skin: Negative for rash.  Neurological: Positive for sensory change and headaches. Negative for dizziness and loss of consciousness.       Left arm numbness and pain intermittently  Endo/Heme/Allergies: Negative for environmental allergies.  Psychiatric/Behavioral: Negative for depression. The patient is nervous/anxious.        Objective:    Physical Exam  Constitutional: He is oriented to person, place, and time. He  appears well-developed and well-nourished. No distress.  HENT:  Head: Normocephalic and  atraumatic.  Nose: Nose normal.  Eyes: Right eye exhibits no discharge. Left eye exhibits no discharge.  Neck: Normal range of motion. Neck supple.  Cardiovascular: Normal rate and regular rhythm.   Murmur heard. Pulmonary/Chest: Effort normal and breath sounds normal.  Abdominal: Soft. Bowel sounds are normal. There is no tenderness.  Musculoskeletal: He exhibits no edema.  Neurological: He is alert and oriented to person, place, and time.  Skin: Skin is warm and dry.  Psychiatric: He has a normal mood and affect.  Nursing note and vitals reviewed.   BP (!) 122/58 (BP Location: Left Arm, Patient Position: Sitting, Cuff Size: Normal)   Pulse 84   Temp 98.1 F (36.7 C) (Oral)   Wt 173 lb 9.6 oz (78.7 kg)   SpO2 97%   BMI 25.64 kg/m  Wt Readings from Last 3 Encounters:  11/03/16 173 lb 9.6 oz (78.7 kg)  11/02/16 173 lb 9.6 oz (78.7 kg)  09/23/16 173 lb (78.5 kg)     Lab Results  Component Value Date   WBC 7.4 09/30/2016   HGB 13.0 09/30/2016   HCT 38.3 (L) 09/30/2016   PLT 190.0 09/30/2016   GLUCOSE 123 (H) 09/30/2016   CHOL 78 09/30/2016   TRIG 153.0 (H) 09/30/2016   HDL 30.60 (L) 09/30/2016   LDLDIRECT 25.0 02/09/2016   LDLCALC 17 09/30/2016   ALT 17 09/30/2016   AST 17 09/30/2016   NA 142 09/30/2016   K 3.7 09/30/2016   CL 106 09/30/2016   CREATININE 1.04 09/30/2016   BUN 18 09/30/2016   CO2 25 09/30/2016   TSH 6.00 (H) 09/30/2016   PSA 2.39 11/25/2010   INR 2.0 10/27/2016   HGBA1C 10.2 (H) 09/30/2016   MICROALBUR 1.1 05/01/2015    Lab Results  Component Value Date   TSH 6.00 (H) 09/30/2016   Lab Results  Component Value Date   WBC 7.4 09/30/2016   HGB 13.0 09/30/2016   HCT 38.3 (L) 09/30/2016   MCV 84.3 09/30/2016   PLT 190.0 09/30/2016   Lab Results  Component Value Date   NA 142 09/30/2016   K 3.7 09/30/2016   CO2 25 09/30/2016   GLUCOSE 123 (H) 09/30/2016   BUN 18 09/30/2016   CREATININE 1.04 09/30/2016   BILITOT 0.4 09/30/2016    ALKPHOS 70 09/30/2016   AST 17 09/30/2016   ALT 17 09/30/2016   PROT 7.3 09/30/2016   ALBUMIN 4.7 09/30/2016   CALCIUM 9.6 09/30/2016   ANIONGAP 10 09/23/2016   GFR 75.47 09/30/2016   Lab Results  Component Value Date   CHOL 78 09/30/2016   Lab Results  Component Value Date   HDL 30.60 (L) 09/30/2016   Lab Results  Component Value Date   LDLCALC 17 09/30/2016   Lab Results  Component Value Date   TRIG 153.0 (H) 09/30/2016   Lab Results  Component Value Date   CHOLHDL 3 09/30/2016   Lab Results  Component Value Date   HGBA1C 10.2 (H) 09/30/2016       Assessment & Plan:   Problem List Items Addressed This Visit    Essential hypertension (Chronic)    Well controlled, no changes to meds. Encouraged heart healthy diet such as the DASH diet and exercise as tolerated.       Diabetes mellitus type II, uncontrolled ([place]) (Chronic)    Following with endocrinology and improving. No changes today  Hypothyroidism    On Levothyroxine, continue to monitor      HLD (hyperlipidemia)    Encouraged heart healthy diet, increase exercise, avoid trans fats, consider a krill oil cap daily      Headache    Continues to struggle with severe headaches and no treatment has been successful. Encouraged increased hydration, 64 ounces of clear fluids daily. Minimize alcohol and caffeine. Eat small frequent meals with lean proteins and complex carbs. Avoid high and low blood sugars. Get adequate sleep, 7-8 hours a night. Needs exercise daily preferably in the morning. Is referred to neurology for further consideration. He does note that his headache was improved after his last spinal surgery but has now returned.          I have discontinued Mr. Tozzi baclofen. I am also having him maintain his tiZANidine, glucose blood, pramipexole, gabapentin, atorvastatin, insulin aspart, Insulin Glargine, isosorbide mononitrate, clotrimazole-betamethasone, diclofenac sodium, nitroGLYCERIN,  metFORMIN, cyclobenzaprine, HYDROcodone-acetaminophen, pantoprazole, topiramate, sertraline, metoprolol succinate, amiodarone, midodrine, isosorbide mononitrate, meclizine, levothyroxine, and warfarin.  No orders of the defined types were placed in this encounter.    Penni Homans, MD

## 2016-11-14 NOTE — Assessment & Plan Note (Signed)
On Levothyroxine, continue to monitor 

## 2016-11-17 ENCOUNTER — Ambulatory Visit (INDEPENDENT_AMBULATORY_CARE_PROVIDER_SITE_OTHER): Payer: Commercial Managed Care - HMO | Admitting: *Deleted

## 2016-11-17 DIAGNOSIS — I480 Paroxysmal atrial fibrillation: Secondary | ICD-10-CM | POA: Diagnosis not present

## 2016-11-17 DIAGNOSIS — Z5181 Encounter for therapeutic drug level monitoring: Secondary | ICD-10-CM

## 2016-11-17 LAB — POCT INR: INR: 1.8

## 2016-11-26 ENCOUNTER — Ambulatory Visit: Payer: Commercial Managed Care - HMO | Admitting: Interventional Cardiology

## 2016-12-03 ENCOUNTER — Ambulatory Visit (INDEPENDENT_AMBULATORY_CARE_PROVIDER_SITE_OTHER): Payer: Commercial Managed Care - HMO

## 2016-12-03 ENCOUNTER — Encounter (INDEPENDENT_AMBULATORY_CARE_PROVIDER_SITE_OTHER): Payer: Self-pay

## 2016-12-03 DIAGNOSIS — I480 Paroxysmal atrial fibrillation: Secondary | ICD-10-CM | POA: Diagnosis not present

## 2016-12-03 DIAGNOSIS — Z5181 Encounter for therapeutic drug level monitoring: Secondary | ICD-10-CM | POA: Diagnosis not present

## 2016-12-03 LAB — POCT INR: INR: 2.4

## 2016-12-10 MED FILL — TOPIRAMATE 50 MG TABLET: 50 | 30 days supply | Qty: 30 | Fill #3

## 2016-12-10 MED FILL — LEVOTHYROXINE 88 MCG TABLET: 88 | 30 days supply | Qty: 30 | Fill #2

## 2016-12-10 MED FILL — METOPROLOL SUCC ER 50 MG TA: 50 | 30 days supply | Qty: 45 | Fill #2

## 2016-12-10 MED FILL — ATORVASTATIN 40 MG TABLET: 40 | 30 days supply | Qty: 30 | Fill #2

## 2016-12-10 MED FILL — WARFARIN SODIUM 5 MG TABLET: 5 | 30 days supply | Qty: 35 | Fill #1

## 2016-12-10 MED FILL — metFORMIN HCL 1000 MG TABS: 1000 | 30 days supply | Qty: 60 | Fill #5

## 2016-12-21 MED FILL — PANTOPRAZOLE SOD DR 40 MG T: 40 | 45 days supply | Qty: 45 | Fill #2

## 2016-12-21 MED FILL — PRAMIPEXOLE 1 MG TABLET: 1 | 30 days supply | Qty: 30 | Fill #2

## 2016-12-24 ENCOUNTER — Emergency Department (HOSPITAL_BASED_OUTPATIENT_CLINIC_OR_DEPARTMENT_OTHER): Payer: Medicare PPO

## 2016-12-24 ENCOUNTER — Encounter (HOSPITAL_BASED_OUTPATIENT_CLINIC_OR_DEPARTMENT_OTHER): Payer: Self-pay

## 2016-12-24 ENCOUNTER — Emergency Department (HOSPITAL_BASED_OUTPATIENT_CLINIC_OR_DEPARTMENT_OTHER)
Admission: EM | Admit: 2016-12-24 | Discharge: 2016-12-24 | Disposition: A | Discharge status: Home / Self Care | Payer: Medicare PPO | Attending: Emergency Medicine | Admitting: Emergency Medicine

## 2016-12-24 DIAGNOSIS — Z79899 Other long term (current) drug therapy: Secondary | ICD-10-CM | POA: Diagnosis not present

## 2016-12-24 DIAGNOSIS — I110 Hypertensive heart disease with heart failure: Secondary | ICD-10-CM | POA: Insufficient documentation

## 2016-12-24 DIAGNOSIS — Z794 Long term (current) use of insulin: Secondary | ICD-10-CM | POA: Insufficient documentation

## 2016-12-24 DIAGNOSIS — I5032 Chronic diastolic (congestive) heart failure: Secondary | ICD-10-CM | POA: Insufficient documentation

## 2016-12-24 DIAGNOSIS — Z7984 Long term (current) use of oral hypoglycemic drugs: Secondary | ICD-10-CM | POA: Diagnosis not present

## 2016-12-24 DIAGNOSIS — I2510 Atherosclerotic heart disease of native coronary artery without angina pectoris: Secondary | ICD-10-CM | POA: Diagnosis not present

## 2016-12-24 DIAGNOSIS — J029 Acute pharyngitis, unspecified: Secondary | ICD-10-CM | POA: Diagnosis present

## 2016-12-24 DIAGNOSIS — B349 Viral infection, unspecified: Secondary | ICD-10-CM | POA: Diagnosis not present

## 2016-12-24 DIAGNOSIS — Z951 Presence of aortocoronary bypass graft: Secondary | ICD-10-CM | POA: Diagnosis not present

## 2016-12-24 DIAGNOSIS — B9789 Other viral agents as the cause of diseases classified elsewhere: Secondary | ICD-10-CM

## 2016-12-24 DIAGNOSIS — R05 Cough: Secondary | ICD-10-CM | POA: Diagnosis not present

## 2016-12-24 DIAGNOSIS — J988 Other specified respiratory disorders: Secondary | ICD-10-CM | POA: Diagnosis not present

## 2016-12-24 DIAGNOSIS — R410 Disorientation, unspecified: Secondary | ICD-10-CM | POA: Insufficient documentation

## 2016-12-24 DIAGNOSIS — Z7901 Long term (current) use of anticoagulants: Secondary | ICD-10-CM | POA: Insufficient documentation

## 2016-12-24 DIAGNOSIS — E119 Type 2 diabetes mellitus without complications: Secondary | ICD-10-CM | POA: Diagnosis not present

## 2016-12-24 LAB — URINALYSIS, ROUTINE W REFLEX MICROSCOPIC
Glucose, UA: NEGATIVE mg/dL
HGB URINE DIPSTICK: NEGATIVE
Ketones, ur: 15 mg/dL — AB
Leukocytes, UA: NEGATIVE
Nitrite: NEGATIVE
PH: 5.5 (ref 5.0–8.0)
Protein, ur: NEGATIVE mg/dL
SPECIFIC GRAVITY, URINE: 1.024 (ref 1.005–1.030)

## 2016-12-24 LAB — CBC WITH DIFFERENTIAL/PLATELET
Basophils Absolute: 0 10*3/uL (ref 0.0–0.1)
Basophils Relative: 0 %
EOS PCT: 1 %
Eosinophils Absolute: 0.1 10*3/uL (ref 0.0–0.7)
HCT: 37.7 % — ABNORMAL LOW (ref 39.0–52.0)
HEMOGLOBIN: 13.1 g/dL (ref 13.0–17.0)
LYMPHS PCT: 16 %
Lymphs Abs: 1.7 10*3/uL (ref 0.7–4.0)
MCH: 29.6 pg (ref 26.0–34.0)
MCHC: 34.7 g/dL (ref 30.0–36.0)
MCV: 85.3 fL (ref 78.0–100.0)
Monocytes Absolute: 1.2 10*3/uL — ABNORMAL HIGH (ref 0.1–1.0)
Monocytes Relative: 11 %
NEUTROS PCT: 72 %
Neutro Abs: 7.7 10*3/uL (ref 1.7–7.7)
PLATELETS: 183 10*3/uL (ref 150–400)
RBC: 4.42 MIL/uL (ref 4.22–5.81)
RDW: 15.1 % (ref 11.5–15.5)
WBC: 10.7 10*3/uL — AB (ref 4.0–10.5)

## 2016-12-24 LAB — COMPREHENSIVE METABOLIC PANEL
ALT: 18 U/L (ref 17–63)
AST: 27 U/L (ref 15–41)
Albumin: 4.5 g/dL (ref 3.5–5.0)
Alkaline Phosphatase: 69 U/L (ref 38–126)
Anion gap: 12 (ref 5–15)
BUN: 16 mg/dL (ref 6–20)
CHLORIDE: 101 mmol/L (ref 101–111)
CO2: 21 mmol/L — AB (ref 22–32)
CREATININE: 1.13 mg/dL (ref 0.61–1.24)
Calcium: 8.9 mg/dL (ref 8.9–10.3)
GFR calc Af Amer: >60 mL/min (ref >60)
Glucose, Bld: 136 mg/dL — ABNORMAL HIGH (ref 65–99)
Potassium: 3.3 mmol/L — ABNORMAL LOW (ref 3.5–5.1)
Sodium: 134 mmol/L — ABNORMAL LOW (ref 135–145)
Total Bilirubin: 1 mg/dL (ref 0.3–1.2)
Total Protein: 8 g/dL (ref 6.5–8.1)

## 2016-12-24 LAB — PROTIME-INR
INR: 2.2
Prothrombin Time: 24.8 seconds — ABNORMAL HIGH (ref 11.4–15.2)

## 2016-12-24 LAB — TROPONIN I

## 2016-12-24 LAB — CBG MONITORING, ED: Glucose-Capillary: 121 mg/dL — ABNORMAL HIGH (ref 65–99)

## 2016-12-24 MED ORDER — SODIUM CHLORIDE 0.9 % IV BOLUS (SEPSIS)
1000.0000 mL | Freq: Once | INTRAVENOUS | Status: AC
  Administered 2016-12-24: 1000 mL via INTRAVENOUS

## 2016-12-24 MED ORDER — DEXTROMETHORPHAN POLISTIREX ER 30 MG/5ML PO SUER: 15.0000 mg | Freq: Two times a day (BID) | ORAL | 0 refills | Status: DC | PRN

## 2016-12-24 MED ORDER — GUAIFENESIN 100 MG/5ML PO SOLN: 5.0000 mL | Freq: Four times a day (QID) | ORAL | Status: DC | PRN

## 2016-12-24 NOTE — ED Triage Notes (Signed)
Pt has a sudden onset of confusion that started last PM. Pt arrived with daughter. Pt has no other complaints. Pt is disoriented to time only. NAD.

## 2016-12-24 NOTE — ED Provider Notes (Signed)
[facility] DEPT [facility] Provider Note   CSN: [id number] Arrival date & time: 12/24/16  1911  By signing my name below, I, Dolores Hoose, attest that this documentation has been prepared under the direction and in the presence of Forde Dandy, MD . Electronically Signed: Dolores Hoose, Scribe. 12/24/2016. 7:42 PM.  History   Chief Complaint Chief Complaint  Patient presents with  . Altered Mental Status   The history is provided by the patient and a relative. No language interpreter was used.   HPI Comments:  [name] [name] is a 71 y.o. male with pmhx of CAD, Afib on coumadin, HTN, HLD, DM, CABG, who presents to the Emergency Department with his daughter complaining of sudden-onset, intermittent confusion beginning about 3 days ago. Pt's daughter reports that the pt has been intermittent disoriented about location and how to complete some basic tasks recently. Pt reports associated diarrhea, sore throat, rhinorrhea, dysuria, frequency, cough, congestion, diffuse myalgias, fatigue over past 3 days. He describes a sensation of "not being himself". Pt's daughter also reports that the pt was experiencing CP last night that he described as a burning sensation, but that this symptoms has currently resolved. He denies any vomiting, nausea, SOB, or hematuria. Pt also denies any recent fall or injury.    Past Medical History:  Diagnosis Date  . ACP (advance care planning) 05/11/2015  . Advance care planning 05/01/2015  . Anemia    hemoglobin 7.4, iron deficiency, January, 2011, 2 unit transfusion, endoscopy normal, capsule endoscopy February, 2011 no small bowel abnormalities.   Most likely source gastric erosions, followed by GI  . Anxiety   . CAD (coronary artery disease)    A. CABG in 2000,status post cardiac cath in 2006, 2009 ....continued chest pain and SOB despite oral medication adjestments including Ranexa. B. Cath November 2009/ mRCA - 2.75 x 23 Abbott Xience V drug-eluting stent  ...11/26/2008 to distal  RCA leading to acute marginal.  C. Cath 07/2012 for CP - stable anatomy, med rx. d. cath 2015 and 05/30/2015 stable anatomy, consider Myoview if has CP again  . Cerebral ischemia    MRI November, 2010, chronic microvascular ischemia  . Chronic diastolic CHF (congestive heart failure) ([place]) 03/13/2016  . Depression    Bipolar  . Diabetes mellitus type II, uncontrolled ([place])   . Diabetes mellitus without complication ([place])   . Diarrhea 02/15/2016  . Dizziness   . Edema   . Ejection fraction    EF 60%, echo, July 31, 2012  . Falling episodes    these have occurred in the past and again recurring 2011  . Family history of adverse reaction to anesthesia    "mother died during bypass surgery but not sure if it has to do with anesthesia"  . Gastric ulcer   . GERD (gastroesophageal reflux disease)   . H/O medication noncompliance    Due to loss of insurance  . Hard of hearing   . Headache 01/25/2016  . Hx of CABG    2000,  / one median sternotomy suture broken her chest x-ray November, 2010, no clinical significance  . Hyperlipidemia   . Hypertension    pt. denies  . Iron deficiency anemia   . Low back pain 06/12/2009   Qualifier: Diagnosis of  By: [name]   . Nausea with vomiting 01/25/2016  . Nephrolithiasis   . OSA (obstructive sleep apnea)   . Pain in limb 06/12/2009   Qualifier: Diagnosis of  By: Shawna Orleans DO,  Sandy Salaam   . Palpitations    event recorder showed sinus rhythm  . RBBB (right bundle branch block)   . Restless leg   . Right knee pain 01/07/2015  . Shortness of breath    CPX April, 2011, mild functional limitation, no clear pulmonary or cardiac limitation, possible deconditioning and mild chronotropic incompetence( peak heart rate 130)  . Spondylosis    C5-6, C6-7 MRI 2010  . Syncope 03/2016  . Thyroid disease   . Tubulovillous adenoma of colon 2007  . Wears glasses     Patient Active Problem List   Diagnosis Date Noted  . Atrial  fibrillation ([place]) [I48.91] 07/20/2016  . Long term (current) use of anticoagulants [Z79.01] 07/20/2016  . Foraminal stenosis of cervical region 05/31/2016  . Exertional dyspnea 05/14/2016  . Chronic diastolic CHF (congestive heart failure) ([place]) 03/13/2016  . Confusion 03/10/2016  . Diarrhea 02/15/2016  . Headache 01/25/2016  . Carotid artery disease ([place]) 08/01/2015  . Spinal stenosis in cervical region 07/30/2015  . Occipital neuralgia of right side 07/15/2015  . Cervicogenic headache 07/15/2015  . Dizziness   . Chest pain 05/29/2015  . Medicare annual wellness visit, initial 05/11/2015  . Sun-damaged skin 05/11/2015  . Advance care planning 05/01/2015  . Concussion without loss of consciousness 02/18/2015  . Post-traumatic headache 02/18/2015  . Right knee pain 01/07/2015  . Personal history of ongoing treatment with alendronate (Fosamax) 12/20/2013  . HLD (hyperlipidemia) 06/27/2013  . Arthritis of hand, degenerative 06/05/2013  . Acquired atrophy of thyroid 06/05/2013  . Gastro-esophageal reflux disease without esophagitis 06/05/2013  . Clinical depression 06/05/2013  . Arteriosclerosis of coronary artery 05/01/2013  . Benign prostatic hyperplasia with urinary obstruction 05/01/2013  . Abdominal pain, unspecified site 03/06/2013  . Right shoulder pain 10/24/2012  . Eczema 10/24/2012  . Bilateral groin pain 09/21/2012  . Ejection fraction   . Diabetes mellitus type II, uncontrolled ([place]) 08/31/2011  . Coronary artery disease involving native coronary artery   . Anemia   . OSA (obstructive sleep apnea)   . Edema   . Essential hypertension   . Falling episodes   . Hx of CABG   . Palpitations   . Shortness of breath   . Insomnia 04/26/2011  . FATIGUE 12/10/2010  . Hypothyroidism 06/26/2010  . BENIGN POSITIONAL VERTIGO 09/19/2009  . Depression 07/09/2009  . RBBB 07/09/2009  . Low back pain 06/12/2009  . SCIATICA 01/17/2009  . TUBULOVILLOUS ADENOMA, COLON, HX OF  01/08/2009  . Presence of drug coated stent in right coronary artery 10/29/2008  . NEPHROLITHIASIS, HX OF 12/20/2007    Past Surgical History:  Procedure Laterality Date  . ANTERIOR CERVICAL DECOMP/DISCECTOMY FUSION N/A 05/31/2016   Procedure: ANTERIOR CERVICAL DECOMPRESSION/DISCECTOMY FUSION CERVICAL FIVE-SIX,CERVICAL SIX-SEVEN;  Surgeon: [name], MD;  Location: [facility]URO ORS;  Service: Neurosurgery;  Laterality: N/A;  . CARDIAC CATHETERIZATION N/A 05/30/2015   Procedure: Left Heart Cath and Coronary Angiography;  Surgeon: [name], MD;  Location: [facility] LAB;  Service: Cardiovascular;  Laterality: N/A;  . COLONOSCOPY    . CORONARY ARTERY BYPASS GRAFT     2000  . ESOPHAGOGASTRODUODENOSCOPY    . LEFT HEART CATHETERIZATION WITH CORONARY/GRAFT ANGIOGRAM N/A 08/01/2012   Procedure: LEFT HEART CATHETERIZATION WITH Beatrix Fetters;  Surgeon: [name], MD;  Location[facility] CATH LAB;  Service: Cardiovascular;  Laterality: N/A;  . LEFT HEART CATHETERIZATION WITH CORONARY/GRAFT ANGIOGRAM N/A 01/03/2015   Procedure: LEFT HEART CATHETERIZATION WITH Beatrix Fetters;  Surgeon: Roderic Palau  Adora Fridge, MD;  Location: [facility] CATH LAB;  Service: Cardiovascular;  Laterality: N/A;  . NASAL SEPTUM SURGERY     UP3  . PERCUTANEOUS CORONARY STENT INTERVENTION (PCI-S)  10/2008   mRCA PCI  2.75 x 23 Abbott Xience V drug-eluting stent        Home Medications    Prior to Admission medications   Medication Sig Start Date End Date Taking? Authorizing Provider  amiodarone (PACERONE) 400 MG tablet Take 1 tablet (400 mg total) by mouth daily. 08/23/16   Brittainy Erie Noe, PA-C  atorvastatin (LIPITOR) 40 MG tablet TAKE 1 TABLET BY MOUTH DAILY AT [facility] 04/14/16   Almyra Deforest, PA  clotrimazole-betamethasone (LOTRISONE) cream Apply 1 application topically 2 (two) times daily.    Historical Provider, MD  cyclobenzaprine (FLEXERIL) 10 MG tablet Take 1 tablet (10 mg total) by mouth 3 (three) times  daily as needed for muscle spasms. 06/01/16   [name], MD  dextromethorphan (ROBITUSSIN 12 HOUR COUGH) 30 MG/5ML liquid Take 2.5 mLs (15 mg total) by mouth 2 (two) times daily as needed for cough. 12/24/16   Forde Dandy, MD  diclofenac sodium (VOLTAREN) 1 % GEL Apply 2 g topically daily as needed (for pain in affected area).    Historical Provider, MD  gabapentin (NEURONTIN) 300 MG capsule Take 2 capsules (600 mg) by mouth  every morning, 1 capsule (300 mg) by mouth  at noon, and 2 capsules (600 mg) by mouth at night    Historical Provider, MD  glucose blood (ACCU-CHEK AVIVA) test strip Use as directed to check blood sugar 4 times daily.  DX E11.9 02/09/16   [name], MD  HYDROcodone-acetaminophen (NORCO/VICODIN) 5-325 MG tablet Take 1-2 tablets by mouth every 4 (four) hours as needed (mild pain). 06/01/16   [name], MD  insulin aspart (NOVOLOG) 100 UNIT/ML injection Inject 10-17 Units into the skin 3 (three) times daily with meals. Use 10-17 unit 3 times daily before meals 04/26/16   [name], MD  Insulin Glargine (TOUJEO SOLOSTAR) 300 UNIT/ML SOPN Inject 42 Units into the skin at bedtime. Patient taking differently: Inject 48 Units into the skin at bedtime.  04/26/16   [name], MD  isosorbide mononitrate (IMDUR) 120 MG 24 hr tablet Take 120 mg by mouth daily.    Historical Provider, MD  isosorbide mononitrate (IMDUR) 60 MG 24 hr tablet TAKE 2 TABLETS BY MOUTH DAILY 09/09/16   [name], MD  levothyroxine (SYNTHROID, LEVOTHROID) 88 MCG tablet Take 1 tablet (88 mcg total) by mouth daily before breakfast. 10/01/16   [name], MD  meclizine (ANTIVERT) 12.5 MG tablet Take 1 tablet (12.5 mg total) by mouth 3 (three) times daily as needed for dizziness. 09/24/16   Merrily Pew, MD  metFORMIN (GLUCOPHAGE) 1000 MG tablet TAKE 1 TABLET (1,000 MG TOTAL) BY MOUTH 2 (TWO) TIMES DAILY WITH A MEAL. 05/27/16   [name], MD  metoprolol succinate (TOPROL-XL) 50 MG 24 hr tablet Take 1  and a half tablets once a day for a total of 75 mg. 08/04/16   Brittainy Erie Noe, PA-C  midodrine (PROAMATINE) 10 MG tablet Take 1 tablet (10 mg total) by mouth 3 (three) times daily. 09/06/16   [name], MD  nitroGLYCERIN (NITROSTAT) 0.4 MG SL tablet Place 0.4 mg under the tongue every 5 (five) minutes as needed for chest pain.    Historical Provider, MD  pantoprazole (PROTONIX) 40 MG tablet TAKE 1 TABLET BY  MOUTH TWICE DAILY FOR 2 WEEKS THEN DROP TO ONCE DAILY 08/02/16   [name], MD  pramipexole (MIRAPEX) 1 MG tablet Take 1 mg by mouth at bedtime. 03/08/16   Historical Provider, MD  sertraline (ZOLOFT) 100 MG tablet Take 1 tablet (100 mg total) by mouth 2 (two) times daily. 08/03/16   [name], MD  tiZANidine (ZANAFLEX) 4 MG tablet Take 1 tablet (4 mg total) by mouth every 8 (eight) hours as needed for muscle spasms. 11/19/15   Pieter Partridge, DO  topiramate (TOPAMAX) 50 MG tablet TAKE 1 TABLET (50 MG) BY MOUTH AT BEDTIME. 08/02/16   Pieter Partridge, DO  warfarin (COUMADIN) 5 MG tablet Take as directed by [facility] 11/02/16   [name], MD    Family History Family History  Problem Relation Age of Onset  . Pancreatic cancer Brother   . Diabetes Brother     4   . Stroke Brother   . Diabetes Mother   . Heart failure Mother   . Heart failure Father   . Cancer Brother   . Coronary artery disease Brother   . Hypothyroidism Brother   . Heart attack      Nephew  . Irregular heart beat Daughter   . Cancer Maternal Grandmother     unknown   . Other Brother     colon surgery    Social History Social History  Substance Use Topics  . Smoking status: Never Smoker  . Smokeless tobacco: Never Used  . Alcohol use No     Comment: stopped drinking in 1998     Allergies   Morphine and Shellfish-derived products   Review of Systems Review of Systems 10/14 Systems reviewed and are negative for acute change except as noted in the HPI.   Physical  Exam Updated Vital Signs BP 120/60   Pulse 77   Temp 98.2 F (36.8 C) (Oral)   Resp 25   Ht 5\' 9"  (1.753 m)   Wt 170 lb (77.1 kg)   SpO2 95%   BMI 25.10 kg/m   Physical Exam Physical Exam  Nursing note and vitals reviewed. Constitutional: Non-toxic, and in no acute distress Head: Normocephalic and atraumatic.  Mouth/Throat: Oropharynx is clear and dry.  Neck: Normal range of motion. Neck supple.  Cardiovascular: Normal rate and regular rhythm.   Pulmonary/Chest: Effort normal and breath sounds normal.  Abdominal: Soft. There is no tenderness. There is no rebound and no guarding. Mild suprapubic discomfort.  Musculoskeletal: Normal range of motion.  Neurological: Alert, fully oriented, no facial droop, fluent speech, moves all extremities symmetrically. PERRL. Sensation to light touch intact throughout. No pronator drift.  Skin: Skin is warm and dry.  Psychiatric: Cooperative  ED Treatments / Results  DIAGNOSTIC STUDIES:  Oxygen Saturation is 95% on RA, normal by my interpretation.    COORDINATION OF CARE:  8:16 PM Discussed treatment plan with pt at bedside which includes imaging and fluids and pt agreed to plan.  Labs (all labs ordered are listed, but only abnormal results are displayed) Labs Reviewed  CBC WITH DIFFERENTIAL/PLATELET - Abnormal; Notable for the following:       Result Value   WBC 10.7 (*)    HCT 37.7 (*)    Monocytes Absolute 1.2 (*)    All other components within normal limits  COMPREHENSIVE METABOLIC PANEL - Abnormal; Notable for the following:    Sodium 134 (*)    Potassium 3.3 (*)  CO2 21 (*)    Glucose, Bld 136 (*)    All other components within normal limits  URINALYSIS, ROUTINE W REFLEX MICROSCOPIC - Abnormal; Notable for the following:    Bilirubin Urine SMALL (*)    Ketones, ur 15 (*)    All other components within normal limits  PROTIME-INR - Abnormal; Notable for the following:    Prothrombin Time 24.8 (*)    All other components  within normal limits  CBG MONITORING, ED - Abnormal; Notable for the following:    Glucose-Capillary 121 (*)    All other components within normal limits  URINE CULTURE  TROPONIN I  INFLUENZA PANEL BY PCR (TYPE A & B)    EKG  EKG Interpretation  Date/Time:  Friday December 24 2016 19:28:22 EST Ventricular Rate:  75 PR Interval:    QRS Duration: 132 QT Interval:  411 QTC Calculation: 460 R Axis:   21 Text Interpretation:  Sinus rhythm Right bundle branch block Baseline wander in lead(s) V1 similar to prior EKG  Confirmed by Addilyn Satterwhite MD, Nkechi Linehan [id number]) on 12/24/2016 8:53:42 PM       Radiology Dg Chest 2 View  Result Date: 12/24/2016 CLINICAL DATA:  Cough, difficulty breathing, confusion for few days. EXAM: CHEST  2 VIEW COMPARISON:  Chest x-rays dated 03/13/2016 in 11/2019 2016. FINDINGS: Cardiomediastinal silhouette is stable. Status post median sternotomy for presumed CABG. Sternotomy wires appear intact and stable in alignment. Slightly low lung volumes with associated crowding of the bibasilar bronchovascular markings. Lungs are clear. No pleural effusion or pneumothorax seen. No acute or suspicious osseous finding. IMPRESSION: No active cardiopulmonary disease. No evidence of pneumonia or pulmonary edema. Electronically Signed   By: Franki Cabot [name]   On: 12/24/2016 21:09   Ct Head Wo Contrast  Result Date: 12/24/2016 CLINICAL DATA:  Confusion for few days. EXAM: CT HEAD WITHOUT CONTRAST TECHNIQUE: Contiguous axial images were obtained from the base of the skull through the vertex without intravenous contrast. COMPARISON:  Head CT dated 09/23/2016 FINDINGS: Brain: Mild generalized parenchymal atrophy with commensurate dilatation of the ventricles and sulci P there is no mass, hemorrhage, edema or other evidence of acute parenchymal abnormality. No extra-axial hemorrhage. Vascular: There are chronic calcified atherosclerotic changes of the large vessels at the skull base. No unexpected  hyperdense vessel. Skull: Normal. Negative for fracture or focal lesion. Sinuses/Orbits: Mild mucosal thickening within the ethmoid air cells and maxillary sinuses, of uncertain age. Soft tissues about the orbits are unremarkable. Other: None. IMPRESSION: 1. No acute intracranial abnormality. No intracranial mass, hemorrhage or edema. 2. Mild paranasal sinus disease, of uncertain age. Electronically Signed   By: Franki Cabot [name]   On: 12/24/2016 21:11    Procedures Procedures (including critical care time)  Medications Ordered in ED Medications  sodium chloride 0.9 % bolus 1,000 mL (0 mLs Intravenous Stopped 12/24/16 2204)     Initial Impression / Assessment and Plan / ED Course  I have reviewed the triage vital signs and the nursing notes.  Pertinent labs & imaging results that were available during my care of the patient were reviewed by me and considered in my medical decision making (see chart for details).     Presenting with intermittent disorientation in setting of 3 days cough, congestion, diarrhea, fatigue. Non-toxic and in no acute distress. With normal vital signs. Exam overall non-focal. Mental status normal during exam, and no focal neuro deficits.   Infectious work-up pursued. No evidence of UTI on UA. CXR visualized  and w/o acute cardiopulmonary processes such as pneumonia. No signs of severe dehydration. CT head obtained given coumadin use, and this is negative for acute intracranial processes.   Suspect influenza or viral illness causing symptoms. Daughter states coughing has kept patient up all night long over past few days, which also likely contributing to some disorientation at time. Given normal mental status now and overall unremarkable work-up will plan to discharge home. Influenza swab sent and pending. Would consider starting tamiflu for him is positive as outpatient.  Discussed strict return instructions. To see PCP otherwise in 2 days.  Final Clinical  Impressions(s) / ED Diagnoses   Final diagnoses:  Viral respiratory infection    New Prescriptions Discharge Medication List as of 12/24/2016 10:04 PM     I personally performed the services described in this documentation, which was scribed in my presence. The recorded information has been reviewed and is accurate.     Forde Dandy, MD 12/24/16 [phone]

## 2016-12-24 NOTE — Discharge Instructions (Signed)
You likely have viral infection causing symptoms.  Drink fluids and get plenty of rest.  Return for worsening symptoms, including worsening confusion, fevers, falls, or any other symptoms concerning to you

## 2016-12-25 LAB — INFLUENZA PANEL BY PCR (TYPE A & B)
Influenza A By PCR: POSITIVE — AB
Influenza B By PCR: NEGATIVE

## 2016-12-26 LAB — URINE CULTURE: Culture: <10000 — AB

## 2016-12-27 ENCOUNTER — Telehealth: Payer: Self-pay | Admitting: Pharmacist

## 2016-12-27 ENCOUNTER — Ambulatory Visit (INDEPENDENT_AMBULATORY_CARE_PROVIDER_SITE_OTHER): Payer: Commercial Managed Care - HMO | Admitting: Pharmacist

## 2016-12-27 DIAGNOSIS — I480 Paroxysmal atrial fibrillation: Secondary | ICD-10-CM

## 2016-12-27 NOTE — Telephone Encounter (Signed)
Patient calling to reschedule appt for coumadin check.   See anticoag appt from 12/27/16 for details.

## 2016-12-28 ENCOUNTER — Other Ambulatory Visit: Payer: Self-pay | Admitting: Family Medicine

## 2016-12-30 ENCOUNTER — Encounter: Payer: Self-pay | Admitting: Family Medicine

## 2016-12-30 ENCOUNTER — Ambulatory Visit (INDEPENDENT_AMBULATORY_CARE_PROVIDER_SITE_OTHER): Payer: Commercial Managed Care - HMO | Admitting: Family Medicine

## 2016-12-30 DIAGNOSIS — E039 Hypothyroidism, unspecified: Secondary | ICD-10-CM | POA: Diagnosis not present

## 2016-12-30 DIAGNOSIS — E782 Mixed hyperlipidemia: Secondary | ICD-10-CM

## 2016-12-30 DIAGNOSIS — I10 Essential (primary) hypertension: Secondary | ICD-10-CM | POA: Diagnosis not present

## 2016-12-30 DIAGNOSIS — J988 Other specified respiratory disorders: Secondary | ICD-10-CM

## 2016-12-30 DIAGNOSIS — B9789 Other viral agents as the cause of diseases classified elsewhere: Secondary | ICD-10-CM

## 2016-12-30 MED ORDER — HYDROCODONE-HOMATROPINE 5-1.5 MG/5ML PO SYRP: 5.0000 mL | ORAL_SOLUTION | Freq: Three times a day (TID) | ORAL | 0 refills | Status: DC | PRN

## 2016-12-30 MED FILL — HYDROCODONE-HOMATROPINE SYR: 5-1.5 | 8 days supply | Qty: 120 | Fill #0

## 2016-12-30 NOTE — Progress Notes (Signed)
Subjective:    Patient ID: [name] [name], male    DOB: 1946/01/24, 71 y.o.   MRN: [id number]  Chief Complaint  Patient presents with  . Follow-up    Hypothyroidism.And for and ED viisit 12/24/2016.    HPI Patient is in today for for a two message for hypothyroidism. Patient was also seen in the ED on 12/24/2016 with a VRI diagnosis. Patient states that he has a persistent cough which is productive of green sputum. He continues to struggle with fatigue, malaise, myalgias, nasal congestion. Denies CP/palp/fevers/GI or GU c/o. Taking meds as prescribed  I acted as a Education administrator for Dr. Charlett Blake. Raiford Noble, [place]   Past Medical History:  Diagnosis Date  . ACP (advance care planning) 05/11/2015  . Advance care planning 05/01/2015  . Anemia    hemoglobin 7.4, iron deficiency, January, 2011, 2 unit transfusion, endoscopy normal, capsule endoscopy February, 2011 no small bowel abnormalities.   Most likely source gastric erosions, followed by GI  . Anxiety   . CAD (coronary artery disease)    A. CABG in 2000,status post cardiac cath in 2006, 2009 ....continued chest pain and SOB despite oral medication adjestments including Ranexa. B. Cath November 2009/ mRCA - 2.75 x 23 Abbott Xience V drug-eluting stent ...11/26/2008 to distal  RCA leading to acute marginal.  C. Cath 07/2012 for CP - stable anatomy, med rx. d. cath 2015 and 05/30/2015 stable anatomy, consider Myoview if has CP again  . Cerebral ischemia    MRI November, 2010, chronic microvascular ischemia  . Chronic diastolic CHF (congestive heart failure) ([place]) 03/13/2016  . Depression    Bipolar  . Diabetes mellitus type II, uncontrolled ([place])   . Diabetes mellitus without complication ([place])   . Diarrhea 02/15/2016  . Dizziness   . Edema   . Ejection fraction    EF 60%, echo, July 31, 2012  . Falling episodes    these have occurred in the past and again recurring 2011  . Family history of adverse reaction to anesthesia    "mother died  during bypass surgery but not sure if it has to do with anesthesia"  . Gastric ulcer   . GERD (gastroesophageal reflux disease)   . H/O medication noncompliance    Due to loss of insurance  . Hard of hearing   . Headache 01/25/2016  . Hx of CABG    2000,  / one median sternotomy suture broken her chest x-ray November, 2010, no clinical significance  . Hyperlipidemia   . Hypertension    pt. denies  . Iron deficiency anemia   . Low back pain 06/12/2009   Qualifier: Diagnosis of  By: [name]   . Nausea with vomiting 01/25/2016  . Nephrolithiasis   . OSA (obstructive sleep apnea)   . Pain in limb 06/12/2009   Qualifier: Diagnosis of  By: [name]   . Palpitations    event recorder showed sinus rhythm  . RBBB (right bundle branch block)   . Restless leg   . Right knee pain 01/07/2015  . Shortness of breath    CPX April, 2011, mild functional limitation, no clear pulmonary or cardiac limitation, possible deconditioning and mild chronotropic incompetence( peak heart rate 130)  . Spondylosis    C5-6, C6-7 MRI 2010  . Syncope 03/2016  . Thyroid disease   . Tubulovillous adenoma of colon 2007  . Wears glasses     Past Surgical History:  Procedure Laterality Date  .  ANTERIOR CERVICAL DECOMP/DISCECTOMY FUSION N/A 05/31/2016   Procedure: ANTERIOR CERVICAL DECOMPRESSION/DISCECTOMY FUSION CERVICAL FIVE-SIX,CERVICAL SIX-SEVEN;  Surgeon: [name], MD;  Location: [facility];  Service: Neurosurgery;  Laterality: N/A;  . CARDIAC CATHETERIZATION N/A 05/30/2015   Procedure: Left Heart Cath and Coronary Angiography;  Surgeon: [name], MD;  Location: [facility];  Service: Cardiovascular;  Laterality: N/A;  . COLONOSCOPY    . CORONARY ARTERY BYPASS GRAFT     2000  . ESOPHAGOGASTRODUODENOSCOPY    . LEFT HEART CATHETERIZATION WITH CORONARY/GRAFT ANGIOGRAM N/A 08/01/2012   Procedure: LEFT HEART CATHETERIZATION WITH Beatrix Fetters;  Surgeon: [name], MD;   Location: [facility] CATH LAB;  Service: Cardiovascular;  Laterality: N/A;  . LEFT HEART CATHETERIZATION WITH CORONARY/GRAFT ANGIOGRAM N/A 01/03/2015   Procedure: LEFT HEART CATHETERIZATION WITH Beatrix Fetters;  Surgeon: [name], MD;  Location: [facility] CATH LAB;  Service: Cardiovascular;  Laterality: N/A;  . NASAL SEPTUM SURGERY     UP3  . PERCUTANEOUS CORONARY STENT INTERVENTION (PCI-S)  10/2008   mRCA PCI  2.75 x 23 Abbott Xience V drug-eluting stent     Family History  Problem Relation Age of Onset  . Pancreatic cancer Brother   . Diabetes Brother     4   . Stroke Brother   . Diabetes Mother   . Heart failure Mother   . Heart failure Father   . Cancer Brother   . Coronary artery disease Brother   . Hypothyroidism Brother   . Heart attack      Nephew  . Irregular heart beat Daughter   . Cancer Maternal Grandmother     unknown   . Other Brother     colon surgery    Social History   Social History  . Marital status: Married    Spouse name: N/A  . Number of children: 4  . Years of education: 31   Occupational History  . retired    Social History Main Topics  . Smoking status: Never Smoker  . Smokeless tobacco: Never Used  . Alcohol use No     Comment: stopped drinking in 1998  . Drug use: No  . Sexual activity: Yes    Partners: Female   Other Topics Concern  . Not on file   Social History Narrative   Patient is right handed.   Patient does not drink caffeine.    Outpatient Medications Prior to Visit  Medication Sig Dispense Refill  . amiodarone (PACERONE) 400 MG tablet Take 1 tablet (400 mg total) by mouth daily. 30 tablet 0  . atorvastatin (LIPITOR) 40 MG tablet TAKE 1 TABLET BY MOUTH DAILY AT 6PM 30 tablet 5  . clotrimazole-betamethasone (LOTRISONE) cream Apply 1 application topically 2 (two) times daily.    . cyclobenzaprine (FLEXERIL) 10 MG tablet Take 1 tablet (10 mg total) by mouth 3 (three) times daily as needed for muscle spasms. 30 tablet 0   . dextromethorphan (ROBITUSSIN 12 HOUR COUGH) 30 MG/5ML liquid Take 2.5 mLs (15 mg total) by mouth 2 (two) times daily as needed for cough. 89 mL 0  . diclofenac sodium (VOLTAREN) 1 % GEL Apply 2 g topically daily as needed (for pain in affected area).    . gabapentin (NEURONTIN) 300 MG capsule Take 2 capsules (600 mg) by mouth  every morning, 1 capsule (300 mg) by mouth  at noon, and 2 capsules (600 mg) by mouth at night    . glucose blood (ACCU-CHEK AVIVA) test strip  Use as directed to check blood sugar 4 times daily.  DX E11.9 200 each 6  . HYDROcodone-acetaminophen (NORCO/VICODIN) 5-325 MG tablet Take 1-2 tablets by mouth every 4 (four) hours as needed (mild pain). 80 tablet 0  . insulin aspart (NOVOLOG) 100 UNIT/ML injection Inject 10-17 Units into the skin 3 (three) times daily with meals. Use 10-17 unit 3 times daily before meals 15 mL 4  . Insulin Glargine (TOUJEO SOLOSTAR) 300 UNIT/ML SOPN Inject 42 Units into the skin at bedtime. (Patient taking differently: Inject 48 Units into the skin at bedtime. ) 3 pen 4  . isosorbide mononitrate (IMDUR) 120 MG 24 hr tablet Take 120 mg by mouth daily.    . isosorbide mononitrate (IMDUR) 60 MG 24 hr tablet TAKE TWO TABLETS BY MOUTH DAILY 180 tablet 0  . levothyroxine (SYNTHROID, LEVOTHROID) 88 MCG tablet Take 1 tablet (88 mcg total) by mouth daily before breakfast. 30 tablet 3  . meclizine (ANTIVERT) 12.5 MG tablet Take 1 tablet (12.5 mg total) by mouth 3 (three) times daily as needed for dizziness. 10 tablet 0  . metFORMIN (GLUCOPHAGE) 1000 MG tablet TAKE 1 TABLET (1,000 MG TOTAL) BY MOUTH 2 (TWO) TIMES DAILY WITH A MEAL. 60 tablet 5  . metoprolol succinate (TOPROL-XL) 50 MG 24 hr tablet Take 1 and a half tablets once a day for a total of 75 mg. (Patient taking differently: Take 75 mg by mouth daily. ) 45 tablet 6  . midodrine (PROAMATINE) 10 MG tablet Take 1 tablet (10 mg total) by mouth 3 (three) times daily. 90 tablet 3  . nitroGLYCERIN (NITROSTAT)  0.4 MG SL tablet Place 0.4 mg under the tongue every 5 (five) minutes as needed for chest pain.    . pantoprazole (PROTONIX) 40 MG tablet TAKE 1 TABLET BY MOUTH TWICE DAILY FOR 2 WEEKS THEN DROP TO ONCE DAILY 45 tablet 5  . pramipexole (MIRAPEX) 1 MG tablet Take 1 mg by mouth at bedtime.    . sertraline (ZOLOFT) 100 MG tablet Take 1 tablet (100 mg total) by mouth 2 (two) times daily. 180 tablet 1  . tiZANidine (ZANAFLEX) 4 MG tablet Take 1 tablet (4 mg total) by mouth every 8 (eight) hours as needed for muscle spasms. 90 tablet 2  . topiramate (TOPAMAX) 50 MG tablet TAKE 1 TABLET (50 MG) BY MOUTH AT BEDTIME. 30 tablet 3  . warfarin (COUMADIN) 5 MG tablet Take as directed by [facility] (Patient taking differently: Take 5-7.5 mg by mouth See admin instructions. Take 1 tablet every day except on Friday and Saturday then take 1 and 1/2 tablets) 35 tablet 3   No facility-administered medications prior to visit.     Allergies  Allergen Reactions  . Morphine Other (See Comments)    hallucinations  . Shellfish-Derived Products Itching    Review of Systems  Constitutional: Positive for malaise/fatigue. Negative for fever.  HENT: Positive for congestion.   Eyes: Negative for blurred vision.  Respiratory: Positive for cough and sputum production. Negative for shortness of breath.   Cardiovascular: Negative for chest pain, palpitations and leg swelling.  Gastrointestinal: Negative for vomiting.  Genitourinary: Positive for frequency.  Musculoskeletal: Positive for myalgias. Negative for back pain.  Skin: Negative for rash.  Neurological: Negative for loss of consciousness and headaches.       Objective:    Physical Exam  Constitutional: He is oriented to person, place, and time. He appears well-developed and well-nourished. No distress.  HENT:  Head: Normocephalic and atraumatic.  Nasal mucosa is erythematous and boggy.oropharynx erythematous.   Eyes: Conjunctivae are normal.  Neck:  Normal range of motion. No thyromegaly present.  Cardiovascular: Normal rate and regular rhythm.   Pulmonary/Chest: Effort normal and breath sounds normal. He has no wheezes.  Slight rhonchi b/l bases.  Abdominal: Soft. Bowel sounds are normal. There is no tenderness.  Musculoskeletal: He exhibits no edema or deformity.  Lymphadenopathy:    He has cervical adenopathy.  Neurological: He is alert and oriented to person, place, and time.  Skin: Skin is warm and dry. He is not diaphoretic.  Psychiatric: He has a normal mood and affect.    BP 125/65 (BP Location: Left Arm, Patient Position: Sitting, Cuff Size: Normal)   Pulse 65   Temp 97.9 F (36.6 C) (Oral)   Wt 168 lb (76.2 kg)   SpO2 100% Comment: RA  BMI 24.81 kg/m  Wt Readings from Last 3 Encounters:  12/30/16 168 lb (76.2 kg)  12/24/16 170 lb (77.1 kg)  11/03/16 173 lb 9.6 oz (78.7 kg)     Lab Results  Component Value Date   WBC 12.9 (H) 01/02/2017   HGB 12.7 (L) 01/02/2017   HCT 36.6 (L) 01/02/2017   PLT 246 01/02/2017   GLUCOSE 96 01/02/2017   CHOL 78 09/30/2016   TRIG 153.0 (H) 09/30/2016   HDL 30.60 (L) 09/30/2016   LDLDIRECT 25.0 02/09/2016   LDLCALC 17 09/30/2016   ALT 18 12/24/2016   AST 27 12/24/2016   NA 135 01/02/2017   K 4.0 01/02/2017   CL 107 01/02/2017   CREATININE 1.01 01/02/2017   BUN 8 01/02/2017   CO2 20 (L) 01/02/2017   TSH 6.00 (H) 09/30/2016   PSA 2.39 11/25/2010   INR 5.12 (HH) 01/02/2017   HGBA1C 10.2 (H) 09/30/2016   MICROALBUR 1.1 05/01/2015    Lab Results  Component Value Date   TSH 6.00 (H) 09/30/2016   Lab Results  Component Value Date   WBC 12.9 (H) 01/02/2017   HGB 12.7 (L) 01/02/2017   HCT 36.6 (L) 01/02/2017   MCV 83.9 01/02/2017   PLT 246 01/02/2017   Lab Results  Component Value Date   NA 135 01/02/2017   K 4.0 01/02/2017   CO2 20 (L) 01/02/2017   GLUCOSE 96 01/02/2017   BUN 8 01/02/2017   CREATININE 1.01 01/02/2017   BILITOT 1.0 12/24/2016   ALKPHOS 69  12/24/2016   AST 27 12/24/2016   ALT 18 12/24/2016   PROT 8.0 12/24/2016   ALBUMIN 4.5 12/24/2016   CALCIUM 8.9 01/02/2017   ANIONGAP 8 01/02/2017   GFR 75.47 09/30/2016   Lab Results  Component Value Date   CHOL 78 09/30/2016   Lab Results  Component Value Date   HDL 30.60 (L) 09/30/2016   Lab Results  Component Value Date   LDLCALC 17 09/30/2016   Lab Results  Component Value Date   TRIG 153.0 (H) 09/30/2016   Lab Results  Component Value Date   CHOLHDL 3 09/30/2016   Lab Results  Component Value Date   HGBA1C 10.2 (H) 09/30/2016       Assessment & Plan:   Problem List Items Addressed This Visit    Essential hypertension (Chronic)    Well controlled, no changes to meds. Encouraged heart healthy diet such as the DASH diet and exercise as tolerated.       Hypothyroidism    On Levothyroxine, continue to monitor      HLD (hyperlipidemia)  encouraged heart healthy diet, avoid trans fats, minimize simple carbs and saturated fats. Increase exercise as tolerated. Tolerating Lipitor      Viral respiratory illness    Encouraged increased rest and hydration, add probiotics, zinc such as Coldeze or Xicam. Treat fevers as needed. Review of records from recent ER visit shows his testing was positive for influenza A. He is doing somewhat better and it is too late to start Tamiflu. Is allowed some Hycodan to use for the cough qhs.          I am having Mr. Hymon start on HYDROcodone-homatropine. I am also having him maintain his tiZANidine, glucose blood, pramipexole, gabapentin, atorvastatin, insulin aspart, Insulin Glargine, isosorbide mononitrate, clotrimazole-betamethasone, diclofenac sodium, nitroGLYCERIN, metFORMIN, cyclobenzaprine, HYDROcodone-acetaminophen, pantoprazole, topiramate, sertraline, metoprolol succinate, amiodarone, midodrine, meclizine, levothyroxine, warfarin, dextromethorphan, and isosorbide mononitrate.  Meds ordered this encounter    Medications  . HYDROcodone-homatropine (HYCODAN) 5-1.5 MG/5ML syrup    Sig: Take 5 mLs by mouth every 8 (eight) hours as needed for cough.    Dispense:  120 mL    Refill:  0    CMA served as scribe during this visit. History, Physical and Plan performed by medical provider. Documentation and orders reviewed and attested to.  Penni Homans, MD

## 2016-12-30 NOTE — Progress Notes (Signed)
Patient ID: [name] [name], male   DOB: [birth year], 71 y.o.   MRN: [id number]

## 2016-12-30 NOTE — Progress Notes (Signed)
Pre visit review using our clinic review tool, if applicable. No additional management support is needed unless otherwise documented below in the visit note. 

## 2016-12-30 NOTE — Patient Instructions (Addendum)
Encouraged to incorporate aged/black garlic, elderberry, zinc, vitamin C and the NOW probiotic into your daily regimen.  Influenza, Adult Influenza ("the flu") is an infection in the lungs, nose, and throat (respiratory tract). It is caused by a virus. The flu causes many common cold symptoms, as well as a high fever and body aches. It can make you feel very sick. The flu spreads easily from person to person (is contagious). Getting a flu shot (influenza vaccination) every year is the best way to prevent the flu. Follow these instructions at home:  Take over-the-counter and prescription medicines only as told by your doctor.  Use a cool mist humidifier to add moisture (humidity) to the air in your home. This can make it easier to breathe.  Rest as needed.  Drink enough fluid to keep your pee (urine) clear or pale yellow.  Cover your mouth and nose when you cough or sneeze.  Wash your hands with soap and water often, especially after you cough or sneeze. If you cannot use soap and water, use hand sanitizer.  Stay home from work or school as told by your doctor. Unless you are visiting your doctor, try to avoid leaving home until your fever has been gone for 24 hours without the use of medicine.  Keep all follow-up visits as told by your doctor. This is important. How is this prevented?  Getting a yearly (annual) flu shot is the best way to avoid getting the flu. You may get the flu shot in late summer, fall, or winter. Ask your doctor when you should get your flu shot.  Wash your hands often or use hand sanitizer often.  Avoid contact with people who are sick during cold and flu season.  Eat healthy foods.  Drink plenty of fluids.  Get enough sleep.  Exercise regularly. Contact a doctor if:  You get new symptoms.  You have:  Chest pain.  Watery poop (diarrhea).  A fever.  Your cough gets worse.  You start to have more mucus.  You feel sick to your stomach  (nauseous).  You throw up (vomit). Get help right away if:  You start to be short of breath or have trouble breathing.  Your skin or nails turn a bluish color.  You have very bad pain or stiffness in your neck.  You get a sudden headache.  You get sudden pain in your face or ear.  You cannot stop throwing up. This information is not intended to replace advice given to you by your health care provider. Make sure you discuss any questions you have with your health care provider. Document Released: 08/31/2008 Document Revised: 04/29/2016 Document Reviewed: 09/16/2015 Elsevier Interactive Patient Education  2017 [employer].

## 2017-01-02 ENCOUNTER — Encounter (HOSPITAL_COMMUNITY): Payer: Self-pay

## 2017-01-02 ENCOUNTER — Emergency Department (HOSPITAL_COMMUNITY): Payer: Medicare PPO

## 2017-01-02 ENCOUNTER — Emergency Department (HOSPITAL_COMMUNITY)
Admission: EM | Admit: 2017-01-02 | Discharge: 2017-01-02 | Disposition: A | Discharge status: Home / Self Care | Payer: Medicare PPO | Attending: Emergency Medicine | Admitting: Emergency Medicine

## 2017-01-02 DIAGNOSIS — E039 Hypothyroidism, unspecified: Secondary | ICD-10-CM | POA: Diagnosis not present

## 2017-01-02 DIAGNOSIS — I5032 Chronic diastolic (congestive) heart failure: Secondary | ICD-10-CM | POA: Insufficient documentation

## 2017-01-02 DIAGNOSIS — E119 Type 2 diabetes mellitus without complications: Secondary | ICD-10-CM | POA: Insufficient documentation

## 2017-01-02 DIAGNOSIS — I2510 Atherosclerotic heart disease of native coronary artery without angina pectoris: Secondary | ICD-10-CM | POA: Diagnosis not present

## 2017-01-02 DIAGNOSIS — E162 Hypoglycemia, unspecified: Secondary | ICD-10-CM

## 2017-01-02 DIAGNOSIS — R55 Syncope and collapse: Secondary | ICD-10-CM | POA: Diagnosis not present

## 2017-01-02 DIAGNOSIS — R791 Abnormal coagulation profile: Secondary | ICD-10-CM

## 2017-01-02 DIAGNOSIS — Z79899 Other long term (current) drug therapy: Secondary | ICD-10-CM | POA: Insufficient documentation

## 2017-01-02 DIAGNOSIS — R569 Unspecified convulsions: Secondary | ICD-10-CM

## 2017-01-02 DIAGNOSIS — Z951 Presence of aortocoronary bypass graft: Secondary | ICD-10-CM | POA: Insufficient documentation

## 2017-01-02 DIAGNOSIS — Z7901 Long term (current) use of anticoagulants: Secondary | ICD-10-CM | POA: Diagnosis not present

## 2017-01-02 DIAGNOSIS — Z794 Long term (current) use of insulin: Secondary | ICD-10-CM | POA: Insufficient documentation

## 2017-01-02 DIAGNOSIS — R05 Cough: Secondary | ICD-10-CM | POA: Diagnosis not present

## 2017-01-02 DIAGNOSIS — E10649 Type 1 diabetes mellitus with hypoglycemia without coma: Secondary | ICD-10-CM | POA: Diagnosis not present

## 2017-01-02 DIAGNOSIS — I110 Hypertensive heart disease with heart failure: Secondary | ICD-10-CM | POA: Diagnosis not present

## 2017-01-02 LAB — CBC
HCT: 36.6 % — ABNORMAL LOW (ref 39.0–52.0)
Hemoglobin: 12.7 g/dL — ABNORMAL LOW (ref 13.0–17.0)
MCH: 29.1 pg (ref 26.0–34.0)
MCHC: 34.7 g/dL (ref 30.0–36.0)
MCV: 83.9 fL (ref 78.0–100.0)
PLATELETS: 246 10*3/uL (ref 150–400)
RBC: 4.36 MIL/uL (ref 4.22–5.81)
RDW: 14.1 % (ref 11.5–15.5)
WBC: 12.9 10*3/uL — ABNORMAL HIGH (ref 4.0–10.5)

## 2017-01-02 LAB — BASIC METABOLIC PANEL
ANION GAP: 8 (ref 5–15)
BUN: 8 mg/dL (ref 6–20)
CALCIUM: 8.9 mg/dL (ref 8.9–10.3)
CO2: 20 mmol/L — ABNORMAL LOW (ref 22–32)
CREATININE: 1.01 mg/dL (ref 0.61–1.24)
Chloride: 107 mmol/L (ref 101–111)
GFR calc Af Amer: >60 mL/min (ref >60)
GLUCOSE: 96 mg/dL (ref 65–99)
Potassium: 4 mmol/L (ref 3.5–5.1)
Sodium: 135 mmol/L (ref 135–145)

## 2017-01-02 LAB — URINALYSIS, ROUTINE W REFLEX MICROSCOPIC
Bilirubin Urine: NEGATIVE
GLUCOSE, UA: 50 mg/dL — AB
HGB URINE DIPSTICK: NEGATIVE
Ketones, ur: NEGATIVE mg/dL
Leukocytes, UA: NEGATIVE
Nitrite: NEGATIVE
Protein, ur: NEGATIVE mg/dL
SPECIFIC GRAVITY, URINE: 1.012 (ref 1.005–1.030)
pH: 8 (ref 5.0–8.0)

## 2017-01-02 LAB — PROTIME-INR
INR: 5.12
PROTHROMBIN TIME: 48.7 s — AB (ref 11.4–15.2)

## 2017-01-02 LAB — CBG MONITORING, ED
GLUCOSE-CAPILLARY: 95 mg/dL (ref 65–99)
GLUCOSE-CAPILLARY: 74 mg/dL (ref 65–99)
Glucose-Capillary: 76 mg/dL (ref 65–99)
Glucose-Capillary: 89 mg/dL (ref 65–99)
Glucose-Capillary: 112 mg/dL — ABNORMAL HIGH (ref 65–99)

## 2017-01-02 MED ORDER — ONDANSETRON 4 MG PO TBDP
4.0000 mg | ORAL_TABLET | Freq: Once | ORAL | Status: AC
  Administered 2017-01-02: 4 mg via ORAL
  Filled 2017-01-02: qty 1

## 2017-01-02 MED ORDER — ACETAMINOPHEN 500 MG PO TABS
1000.0000 mg | ORAL_TABLET | Freq: Once | ORAL | Status: AC
  Administered 2017-01-02: 1000 mg via ORAL
  Filled 2017-01-02: qty 2

## 2017-01-02 NOTE — Assessment & Plan Note (Signed)
Encouraged increased rest and hydration, add probiotics, zinc such as Coldeze or Xicam. Treat fevers as needed. Review of records from recent ER visit shows his testing was positive for influenza A. He is doing somewhat better and it is too late to start Tamiflu. Is allowed some Hycodan to use for the cough qhs.

## 2017-01-02 NOTE — ED Notes (Signed)
Breakfast tray ordered for patient.

## 2017-01-02 NOTE — Discharge Instructions (Signed)
PLEASE STOP  YOUR COUMADIN FOR TWO DAYS AND SEE COUMADIN CLINIC FOR RECHECK  STOP ALL INSULIN AND METFORMIN TODAY (01/02/17) PLEASE CHECK YOUR SUGAR FREQUENTLY, AND IF IT STARTS TO GET ABOVE 200 TOMORROW THEN RESTART YOUR DIABETIC MEDICATIONS AND SEE YOUR PRIMARY DOCTOR IN 2-3 DAYS

## 2017-01-02 NOTE — ED Provider Notes (Signed)
Patient received in turnover from Dr. Christy Gentles. Family is concerned about a progressive change in mental status. They're concerned because he had urinated on himself. Patient was obstinate ED for 3-1/2 hours. Blood sugar was normal throughout. Has able to have a conversation with the patient without any confusion. Is able to ambulate to the bathroom without assistance. We'll have them discuss the progressive change in his mental status with his family physician tomorrow. Other plan per Dr. Lockie Mola, DO 01/02/17 1118

## 2017-01-02 NOTE — ED Triage Notes (Signed)
Pt comes from home via [facility], called out for seizures, upon EMS arrival pt was unresponsive and CBG of 41. Per wife pt was thrashing around in bed, incontinent of urine and post ictal. No known hx of seizures. PTA given D10 and became alert but confused, Pt was diagnosed with the flu on Thursday

## 2017-01-02 NOTE — ED Notes (Signed)
CBG 76 

## 2017-01-02 NOTE — ED Provider Notes (Signed)
[facility] DEPT Provider Note   CSN: [id number] Arrival date & time: 01/02/17  8185  By signing my name below, I, Hansel Feinstein, attest that this documentation has been prepared under the direction and in the presence of Ripley Fraise, MD. Electronically Signed: Hansel Feinstein, ED Scribe. 01/02/17. 4:01 AM.     History   Chief Complaint Chief Complaint  Patient presents with  . Seizures    HPI [name] [name] is a 71 y.o. male with h/o DM who presents to the Emergency Department brought in by ambulance for evaluation of witnessed seizure-like activity that occurred just PTA. Pt states he does not recall the event. Per EMS, pt was unresponsive with a CBG 41 on their arrival. Wife of pt reported to EMS that  pt experienced full body tremors with urinary incontinence. Pt states he currently feels well. No h/o of similar symptoms. Pt was recently dx with the flu. He denies current HA, CP, abdominal pain, SOB.   The history is provided by the patient, the spouse and the EMS personnel. No language interpreter was used.  Seizures   This is a new problem. The current episode started less than 1 hour ago. The problem has been resolved. There was 1 seizure. Pertinent negatives include no headaches and no chest pain. Characteristics include bladder incontinence. The episode was witnessed. The seizures did not continue in the ED. Possible causes include recent illness.    Past Medical History:  Diagnosis Date  . ACP (advance care planning) 05/11/2015  . Advance care planning 05/01/2015  . Anemia    hemoglobin 7.4, iron deficiency, January, 2011, 2 unit transfusion, endoscopy normal, capsule endoscopy February, 2011 no small bowel abnormalities.   Most likely source gastric erosions, followed by GI  . Anxiety   . CAD (coronary artery disease)    A. CABG in 2000,status post cardiac cath in 2006, 2009 ....continued chest pain and SOB despite oral medication adjestments including Ranexa. B. Cath  November 2009/ mRCA - 2.75 x 23 Abbott Xience V drug-eluting stent ...11/26/2008 to distal  RCA leading to acute marginal.  C. Cath 07/2012 for CP - stable anatomy, med rx. d. cath 2015 and 05/30/2015 stable anatomy, consider Myoview if has CP again  . Cerebral ischemia    MRI November, 2010, chronic microvascular ischemia  . Chronic diastolic CHF (congestive heart failure) ([place]) 03/13/2016  . Depression    Bipolar  . Diabetes mellitus type II, uncontrolled ([place])   . Diabetes mellitus without complication ([place])   . Diarrhea 02/15/2016  . Dizziness   . Edema   . Ejection fraction    EF 60%, echo, July 31, 2012  . Falling episodes    these have occurred in the past and again recurring 2011  . Family history of adverse reaction to anesthesia    "mother died during bypass surgery but not sure if it has to do with anesthesia"  . Gastric ulcer   . GERD (gastroesophageal reflux disease)   . H/O medication noncompliance    Due to loss of insurance  . Hard of hearing   . Headache 01/25/2016  . Hx of CABG    2000,  / one median sternotomy suture broken her chest x-ray November, 2010, no clinical significance  . Hyperlipidemia   . Hypertension    pt. denies  . Iron deficiency anemia   . Low back pain 06/12/2009   Qualifier: Diagnosis of  By: [name]   . Nausea with vomiting 01/25/2016  .  Nephrolithiasis   . OSA (obstructive sleep apnea)   . Pain in limb 06/12/2009   Qualifier: Diagnosis of  By: [name]   . Palpitations    event recorder showed sinus rhythm  . RBBB (right bundle branch block)   . Restless leg   . Right knee pain 01/07/2015  . Shortness of breath    CPX April, 2011, mild functional limitation, no clear pulmonary or cardiac limitation, possible deconditioning and mild chronotropic incompetence( peak heart rate 130)  . Spondylosis    C5-6, C6-7 MRI 2010  . Syncope 03/2016  . Thyroid disease   . Tubulovillous adenoma of colon 2007  . Wears glasses      Patient Active Problem List   Diagnosis Date Noted  . Viral respiratory illness 12/30/2016  . Atrial fibrillation ([place]) [I48.91] 07/20/2016  . Long term (current) use of anticoagulants [Z79.01] 07/20/2016  . Foraminal stenosis of cervical region 05/31/2016  . Exertional dyspnea 05/14/2016  . Chronic diastolic CHF (congestive heart failure) ([place]) 03/13/2016  . Confusion 03/10/2016  . Diarrhea 02/15/2016  . Headache 01/25/2016  . Carotid artery disease ([place]) 08/01/2015  . Spinal stenosis in cervical region 07/30/2015  . Occipital neuralgia of right side 07/15/2015  . Cervicogenic headache 07/15/2015  . Dizziness   . Chest pain 05/29/2015  . Medicare annual wellness visit, initial 05/11/2015  . Sun-damaged skin 05/11/2015  . Advance care planning 05/01/2015  . Concussion without loss of consciousness 02/18/2015  . Post-traumatic headache 02/18/2015  . Right knee pain 01/07/2015  . Personal history of ongoing treatment with alendronate (Fosamax) 12/20/2013  . HLD (hyperlipidemia) 06/27/2013  . Arthritis of hand, degenerative 06/05/2013  . Acquired atrophy of thyroid 06/05/2013  . Gastro-esophageal reflux disease without esophagitis 06/05/2013  . Clinical depression 06/05/2013  . Arteriosclerosis of coronary artery 05/01/2013  . Benign prostatic hyperplasia with urinary obstruction 05/01/2013  . Abdominal pain, unspecified site 03/06/2013  . Right shoulder pain 10/24/2012  . Eczema 10/24/2012  . Bilateral groin pain 09/21/2012  . Ejection fraction   . Diabetes mellitus type II, uncontrolled ([place]) 08/31/2011  . Coronary artery disease involving native coronary artery   . Anemia   . OSA (obstructive sleep apnea)   . Edema   . Essential hypertension   . Falling episodes   . Hx of CABG   . Palpitations   . Shortness of breath   . Insomnia 04/26/2011  . FATIGUE 12/10/2010  . Hypothyroidism 06/26/2010  . BENIGN POSITIONAL VERTIGO 09/19/2009  . Depression 07/09/2009  .  RBBB 07/09/2009  . Low back pain 06/12/2009  . SCIATICA 01/17/2009  . TUBULOVILLOUS ADENOMA, COLON, HX OF 01/08/2009  . Presence of drug coated stent in right coronary artery 10/29/2008  . NEPHROLITHIASIS, HX OF 12/20/2007    Past Surgical History:  Procedure Laterality Date  . ANTERIOR CERVICAL DECOMP/DISCECTOMY FUSION N/A 05/31/2016   Procedure: ANTERIOR CERVICAL DECOMPRESSION/DISCECTOMY FUSION CERVICAL FIVE-SIX,CERVICAL SIX-SEVEN;  Surgeon: [name], MD;  Location: [facility] NEURO ORS;  Service: Neurosurgery;  Laterality: N/A;  . CARDIAC CATHETERIZATION N/A 05/30/2015   Procedure: Left Heart Cath and Coronary Angiography;  Surgeon: [name], MD;  Location: [facility] CV LAB;  Service: Cardiovascular;  Laterality: N/A;  . COLONOSCOPY    . CORONARY ARTERY BYPASS GRAFT     2000  . ESOPHAGOGASTRODUODENOSCOPY    . LEFT HEART CATHETERIZATION WITH CORONARY/GRAFT ANGIOGRAM N/A 08/01/2012   Procedure: LEFT HEART CATHETERIZATION WITH Beatrix Fetters;  Surgeon: [name], MD;  Location[facility]  CATH LAB;  Service: Cardiovascular;  Laterality: N/A;  . LEFT HEART CATHETERIZATION WITH CORONARY/GRAFT ANGIOGRAM N/A 01/03/2015   Procedure: LEFT HEART CATHETERIZATION WITH Beatrix Fetters;  Surgeon: [name], MD;  Location: [facility] CATH LAB;  Service: Cardiovascular;  Laterality: N/A;  . NASAL SEPTUM SURGERY     UP3  . PERCUTANEOUS CORONARY STENT INTERVENTION (PCI-S)  10/2008   mRCA PCI  2.75 x 23 Abbott Xience V drug-eluting stent        Home Medications    Prior to Admission medications   Medication Sig Start Date End Date Taking? Authorizing Provider  amiodarone (PACERONE) 400 MG tablet Take 1 tablet (400 mg total) by mouth daily. 08/23/16   Brittainy Erie Noe, PA-C  atorvastatin (LIPITOR) 40 MG tablet TAKE 1 TABLET BY MOUTH DAILY AT [facility] 04/14/16   Almyra Deforest, PA  clotrimazole-betamethasone (LOTRISONE) cream Apply 1 application topically 2 (two) times daily.    Historical  Provider, MD  cyclobenzaprine (FLEXERIL) 10 MG tablet Take 1 tablet (10 mg total) by mouth 3 (three) times daily as needed for muscle spasms. 06/01/16   [name], MD  dextromethorphan (ROBITUSSIN 12 HOUR COUGH) 30 MG/5ML liquid Take 2.5 mLs (15 mg total) by mouth 2 (two) times daily as needed for cough. 12/24/16   Forde Dandy, MD  diclofenac sodium (VOLTAREN) 1 % GEL Apply 2 g topically daily as needed (for pain in affected area).    Historical Provider, MD  gabapentin (NEURONTIN) 300 MG capsule Take 2 capsules (600 mg) by mouth  every morning, 1 capsule (300 mg) by mouth  at noon, and 2 capsules (600 mg) by mouth at night    Historical Provider, MD  glucose blood (ACCU-CHEK AVIVA) test strip Use as directed to check blood sugar 4 times daily.  DX E11.9 02/09/16   [name], MD  HYDROcodone-acetaminophen (NORCO/VICODIN) 5-325 MG tablet Take 1-2 tablets by mouth every 4 (four) hours as needed (mild pain). 06/01/16   [name], MD  HYDROcodone-homatropine [facility]) 5-1.5 MG/5ML syrup Take 5 mLs by mouth every 8 (eight) hours as needed for cough. 12/30/16   [name], MD  insulin aspart (NOVOLOG) 100 UNIT/ML injection Inject 10-17 Units into the skin 3 (three) times daily with meals. Use 10-17 unit 3 times daily before meals 04/26/16   [name], MD  Insulin Glargine (TOUJEO SOLOSTAR) 300 UNIT/ML SOPN Inject 42 Units into the skin at bedtime. Patient taking differently: Inject 48 Units into the skin at bedtime.  04/26/16   [name], MD  isosorbide mononitrate (IMDUR) 120 MG 24 hr tablet Take 120 mg by mouth daily.    Historical Provider, MD  isosorbide mononitrate (IMDUR) 60 MG 24 hr tablet TAKE TWO TABLETS BY MOUTH DAILY 12/28/16   [name], MD  levothyroxine (SYNTHROID, LEVOTHROID) 88 MCG tablet Take 1 tablet (88 mcg total) by mouth daily before breakfast. 10/01/16   [name], MD  meclizine (ANTIVERT) 12.5 MG tablet Take 1 tablet (12.5 mg total) by mouth 3 (three) times daily  as needed for dizziness. 09/24/16   Merrily Pew, MD  metFORMIN (GLUCOPHAGE) 1000 MG tablet TAKE 1 TABLET (1,000 MG TOTAL) BY MOUTH 2 (TWO) TIMES DAILY WITH A MEAL. 05/27/16   [name], MD  metoprolol succinate (TOPROL-XL) 50 MG 24 hr tablet Take 1 and a half tablets once a day for a total of 75 mg. 08/04/16   Brittainy Erie Noe, PA-C  midodrine (PROAMATINE) 10 MG tablet Take  1 tablet (10 mg total) by mouth 3 (three) times daily. 09/06/16   [name], MD  nitroGLYCERIN (NITROSTAT) 0.4 MG SL tablet Place 0.4 mg under the tongue every 5 (five) minutes as needed for chest pain.    Historical Provider, MD  pantoprazole (PROTONIX) 40 MG tablet TAKE 1 TABLET BY MOUTH TWICE DAILY FOR 2 WEEKS THEN DROP TO ONCE DAILY 08/02/16   [name], MD  pramipexole (MIRAPEX) 1 MG tablet Take 1 mg by mouth at bedtime. 03/08/16   Historical Provider, MD  sertraline (ZOLOFT) 100 MG tablet Take 1 tablet (100 mg total) by mouth 2 (two) times daily. 08/03/16   [name], MD  tiZANidine (ZANAFLEX) 4 MG tablet Take 1 tablet (4 mg total) by mouth every 8 (eight) hours as needed for muscle spasms. 11/19/15   Pieter Partridge, DO  topiramate (TOPAMAX) 50 MG tablet TAKE 1 TABLET (50 MG) BY MOUTH AT BEDTIME. 08/02/16   Pieter Partridge, DO  warfarin (COUMADIN) 5 MG tablet Take as directed by [facility] 11/02/16   [name], MD    Family History Family History  Problem Relation Age of Onset  . Pancreatic cancer Brother   . Diabetes Brother     4   . Stroke Brother   . Diabetes Mother   . Heart failure Mother   . Heart failure Father   . Cancer Brother   . Coronary artery disease Brother   . Hypothyroidism Brother   . Heart attack      Nephew  . Irregular heart beat Daughter   . Cancer Maternal Grandmother     unknown   . Other Brother     colon surgery    Social History Social History  Substance Use Topics  . Smoking status: Never Smoker  . Smokeless tobacco: Never Used  . Alcohol use  No     Comment: stopped drinking in 1998     Allergies   Morphine and Shellfish-derived products   Review of Systems Review of Systems  Respiratory: Negative for shortness of breath.   Cardiovascular: Negative for chest pain.  Gastrointestinal: Negative for abdominal pain.  Genitourinary: Positive for bladder incontinence.  Neurological: Positive for seizures. Negative for headaches.  All other systems reviewed and are negative.    Physical Exam Updated Vital Signs BP 129/66   Pulse 69   Temp 99.2 F (37.3 C) (Oral)   Resp 18   SpO2 97%   Physical Exam CONSTITUTIONAL: Well developed/well nourished HEAD: Normocephalic/atraumatic EYES: EOMI/PERRL ENMT: Mucous membranes moist. No tongue lacerations  NECK: supple no meningeal signs SPINE/BACK:entire spine nontender CV: S1/S2 noted, no murmurs/rubs/gallops noted LUNGS: Lungs are clear to auscultation bilaterally, no apparent distress ABDOMEN: soft, nontender, no rebound or guarding, bowel sounds noted throughout abdomen GU:no cva tenderness NEURO: Pt is awake/alert/appropriate, moves all extremitiesx4.  No facial droop.  No arm or leg drift.  EXTREMITIES: pulses normal/equal, full ROM SKIN: warm, color normal PSYCH: no abnormalities of mood noted, alert and oriented to situation   ED Treatments / Results   DIAGNOSTIC STUDIES: Oxygen Saturation is 97% on RA, normal by my interpretation.    COORDINATION OF CARE: 3:56 AM Discussed treatment plan with pt at bedside which includes lab work and pt agreed to plan.    Labs (all labs ordered are listed, but only abnormal results are displayed) Labs Reviewed  BASIC METABOLIC PANEL - Abnormal; Notable for the following:       Result Value  CO2 20 (*)    All other components within normal limits  CBC - Abnormal; Notable for the following:    WBC 12.9 (*)    Hemoglobin 12.7 (*)    HCT 36.6 (*)    All other components within normal limits  PROTIME-INR - Abnormal;  Notable for the following:    Prothrombin Time 48.7 (*)    INR 5.12 (*)    All other components within normal limits  URINALYSIS, ROUTINE W REFLEX MICROSCOPIC - Abnormal; Notable for the following:    Glucose, UA 50 (*)    All other components within normal limits  CBG MONITORING, ED  CBG MONITORING, ED  CBG MONITORING, ED  CBG MONITORING, ED  CBG MONITORING, ED    EKG  EKG Interpretation  Date/Time:  Sunday January 02 2017 03:49:46 EST Ventricular Rate:  70 PR Interval:    QRS Duration: 125 QT Interval:  420 QTC Calculation: 454 R Axis:   45 Text Interpretation:  Sinus rhythm Right bundle branch block No significant change since last tracing Confirmed by Christy Gentles  MD, Medford Staheli ([id number]) on 01/02/2017 4:09:13 AM       Radiology Dg Chest 2 View  Result Date: 01/02/2017 CLINICAL DATA:  71 year old male with seizures and altered mental status and cough. EXAM: CHEST  2 VIEW COMPARISON:  Chest radiograph dated 12/24/2016 FINDINGS: Two views of the chest demonstrate bibasilar streaky densities, likely atelectatic changes/ scarring. Pneumonia is less likely. There is no pleural effusion or pneumothorax. The cardiac silhouette is within normal limits. Median sternotomy wires and CABG vascular clips noted. Lower cervical fusion hardware. No acute osseous pathology. IMPRESSION: Bibasilar streaky densities, likely atelectatic changes/ scarring. Pneumonia is less likely. Clinical correlation is recommended. Electronically Signed   By: Anner Crete [name]   On: 01/02/2017 06:27   Ct Head Wo Contrast  Result Date: 01/02/2017 CLINICAL DATA:  71 year old male with confusion and syncope. History of diabetes and hypertension. EXAM: CT HEAD WITHOUT CONTRAST TECHNIQUE: Contiguous axial images were obtained from the base of the skull through the vertex without intravenous contrast. COMPARISON:  Head CT dated 12/24/2016 FINDINGS: Brain: The ventricles and sulci appropriate size for patient's age. Minimal  periventricular and deep white matter chronic microvascular ischemic changes noted. There is no acute intracranial hemorrhage. No mass effect or midline shift noted. No extra-axial fluid collections. Vascular: No hyperdense vessel or unexpected calcification. Skull: Normal. Negative for fracture or focal lesion. Sinuses/Orbits: There is diffuse mucoperiosteal thickening of paranasal sinuses. No air-fluid levels. The mastoid air cells are clear. The globes are intact. Other: None IMPRESSION: No acute intracranial pathology. Chronic appearing paranasal sinus disease. Electronically Signed   By: Anner Crete [name]   On: 01/02/2017 06:53    Procedures Procedures (including critical care time)  Medications Ordered in ED Medications - No data to display   Initial Impression / Assessment and Plan / ED Course  I have reviewed the triage vital signs and the nursing notes.  Pertinent labs & imaging results that were available during my care of the patient were reviewed by me and considered in my medical decision making (see chart for details).     5:55 AM Pt in the ED for possible seizure like activity Initial glucose in the 40s with some improvement He is somnolent but easily arousable and appropriate Daughter at bedside confirms story, but she does report what sounds to be a post ictal period No known h/o seizures However, his INR is >5.  Occult ICH is possible  as cause of AMS/Seizure Will also obtain CT head Will also continue to monitor glucose 7:30 AM CT HEAD NEGATIVE PT STABLE AND IMPROVED HE IS AT BASELINE DUE TO RECENT ILLNESS (FLU) HE HAS NOT TAKEN ENOUGH PO RECENTLY AND HAS NOT SCALED BACK INSULIN SUSPECT THIS SEIZURE LIKE EPISODE WAS DUE TO HYPOGLYCEMIA   WILL NEED TO WATCH ANOTHER HOUR AFTER HE IS TAKING PO FLUIDS TO ENSURE HIS GLUCOSE IS IMPROVED 7:31 AM AT SIGNOUT TO DR FLOYD MONITOR TILL 830AM IF TAKING PO FLUIDS, AMBULATORY AND NO HYPOGLYCEMIA HE CAN BE DISCHARGED  NO  COUMADIN FOR 2 DAYS THEN RECHECK AT COUMADIN CLINIC HOLD INSULIN/METFORMIN TODAY THEN CLOSELY MONITOR AT HOME WITH PCP FOLLOWUP THIS WEEK   Final Clinical Impressions(s) / ED Diagnoses   Final diagnoses:  Seizure ([place])  Hypoglycemia  Supratherapeutic INR    New Prescriptions New Prescriptions   No medications on file    ,I personally performed the services described in this documentation, which was scribed in my presence. The recorded information has been reviewed and is accurate.        Ripley Fraise, MD 01/02/17 [phone]

## 2017-01-02 NOTE — ED Notes (Signed)
When administering zofran, daughter states pt has also been dx with flu type A. Will place pt on droplet precautions.

## 2017-01-02 NOTE — Assessment & Plan Note (Signed)
Well controlled, no changes to meds. Encouraged heart healthy diet such as the DASH diet and exercise as tolerated.  °

## 2017-01-02 NOTE — Assessment & Plan Note (Signed)
On Levothyroxine, continue to monitor 

## 2017-01-02 NOTE — ED Notes (Signed)
Pt set up with meal tray. Pt alert and oriented.

## 2017-01-02 NOTE — ED Notes (Signed)
RN called to the room by family to assist with taking pt to the restroom. Pt standing bedside and states "I've already peed on myself." Pt still asked to ambulate to the restroom. Pt walked in hallway with stand by assist. Pt placed in fresh gown and provided with fresh blankets. No distress noted, vital signs stable.

## 2017-01-02 NOTE — Assessment & Plan Note (Addendum)
encouraged heart healthy diet, avoid trans fats, minimize simple carbs and saturated fats. Increase exercise as tolerated. Tolerating Lipitor

## 2017-01-03 ENCOUNTER — Telehealth: Payer: Self-pay | Admitting: Family Medicine

## 2017-01-03 DIAGNOSIS — G4733 Obstructive sleep apnea (adult) (pediatric): Secondary | ICD-10-CM | POA: Diagnosis not present

## 2017-01-03 LAB — CBG MONITORING, ED: GLUCOSE-CAPILLARY: 72 mg/dL (ref 65–99)

## 2017-01-03 NOTE — Telephone Encounter (Signed)
Spoke with patients wife she stated he needed to be seen asap. Spoke with Dr. Charlett Blake she ok'd a 7:45am appt on 01/04/17  PC

## 2017-01-03 NOTE — Telephone Encounter (Signed)
Patient's wife called requesting an ER Follow up appointment for the patient. She states he was seen in the ER for having a seizure in the middle of the night and they would like him to follow up with his PCP. The next available appointment is next Tuesday 01/11/17. She did not want this appointment but asked to have Robin call her back at her convenience. Please advise  Phone: [phone]

## 2017-01-04 ENCOUNTER — Encounter: Payer: Self-pay | Admitting: Family Medicine

## 2017-01-04 ENCOUNTER — Ambulatory Visit (INDEPENDENT_AMBULATORY_CARE_PROVIDER_SITE_OTHER): Payer: Medicare PPO | Admitting: Family Medicine

## 2017-01-04 VITALS — BP 120/62 | HR 68 | Temp 97.7°F | Ht 69.0 in | Wt 161.4 lb

## 2017-01-04 DIAGNOSIS — B9789 Other viral agents as the cause of diseases classified elsewhere: Secondary | ICD-10-CM

## 2017-01-04 DIAGNOSIS — Z794 Long term (current) use of insulin: Secondary | ICD-10-CM

## 2017-01-04 DIAGNOSIS — G40909 Epilepsy, unspecified, not intractable, without status epilepticus: Secondary | ICD-10-CM

## 2017-01-04 DIAGNOSIS — J111 Influenza due to unidentified influenza virus with other respiratory manifestations: Secondary | ICD-10-CM | POA: Diagnosis not present

## 2017-01-04 DIAGNOSIS — E1100 Type 2 diabetes mellitus with hyperosmolarity without nonketotic hyperglycemic-hyperosmolar coma (NKHHC): Secondary | ICD-10-CM | POA: Diagnosis not present

## 2017-01-04 DIAGNOSIS — R791 Abnormal coagulation profile: Secondary | ICD-10-CM

## 2017-01-04 DIAGNOSIS — J988 Other specified respiratory disorders: Secondary | ICD-10-CM

## 2017-01-04 DIAGNOSIS — I10 Essential (primary) hypertension: Secondary | ICD-10-CM

## 2017-01-04 LAB — CBC WITH DIFFERENTIAL/PLATELET
BASOS ABS: 0 10*3/uL (ref 0.0–0.1)
BASOS PCT: 0.4 % (ref 0.0–3.0)
EOS ABS: 0.3 10*3/uL (ref 0.0–0.7)
Eosinophils Relative: 3.7 % (ref 0.0–5.0)
HCT: 39.3 % (ref 39.0–52.0)
Hemoglobin: 13.9 g/dL (ref 13.0–17.0)
LYMPHS PCT: 24.5 % (ref 12.0–46.0)
Lymphs Abs: 1.8 10*3/uL (ref 0.7–4.0)
MCHC: 35.4 g/dL (ref 30.0–36.0)
MCV: 85.5 fl (ref 78.0–100.0)
MONO ABS: 0.6 10*3/uL (ref 0.1–1.0)
Monocytes Relative: 8.4 % (ref 3.0–12.0)
NEUTROS ABS: 4.6 10*3/uL (ref 1.4–7.7)
NEUTROS PCT: 63 % (ref 43.0–77.0)
Platelets: 293 10*3/uL (ref 150.0–400.0)
RBC: 4.59 Mil/uL (ref 4.22–5.81)
RDW: 14.8 % (ref 11.5–15.5)
WBC: 7.3 10*3/uL (ref 4.0–10.5)

## 2017-01-04 LAB — COMPREHENSIVE METABOLIC PANEL
ALBUMIN: 4.3 g/dL (ref 3.5–5.2)
ALT: 14 U/L (ref 0–53)
AST: 12 U/L (ref 0–37)
Alkaline Phosphatase: 79 U/L (ref 39–117)
BILIRUBIN TOTAL: 0.9 mg/dL (ref 0.2–1.2)
BUN: 14 mg/dL (ref 6–23)
CALCIUM: 9.5 mg/dL (ref 8.4–10.5)
CO2: 26 mEq/L (ref 19–32)
CREATININE: 1.12 mg/dL (ref 0.40–1.50)
Chloride: 102 mEq/L (ref 96–112)
GFR: 69.23 mL/min (ref >60.00)
Glucose, Bld: 299 mg/dL — ABNORMAL HIGH (ref 70–99)
Potassium: 4.1 mEq/L (ref 3.5–5.1)
SODIUM: 135 meq/L (ref 135–145)
Total Protein: 8 g/dL (ref 6.0–8.3)

## 2017-01-04 LAB — PROTIME-INR
INR: 3.7 ratio — AB (ref 0.8–1.0)
PROTHROMBIN TIME: 40.6 s — AB (ref 9.6–13.1)

## 2017-01-04 MED ORDER — ALBUTEROL SULFATE HFA 108 (90 BASE) MCG/ACT IN AERS: 2.0000 | INHALATION_SPRAY | Freq: Four times a day (QID) | RESPIRATORY_TRACT | 0 refills | Status: DC | PRN

## 2017-01-04 MED ORDER — HYDROCODONE-HOMATROPINE 5-1.5 MG/5ML PO SYRP: 5.0000 mL | ORAL_SOLUTION | Freq: Three times a day (TID) | ORAL | 0 refills | Status: DC | PRN

## 2017-01-04 MED ORDER — AMOXICILLIN-POT CLAVULANATE 875-125 MG PO TABS: 1.0000 | ORAL_TABLET | Freq: Two times a day (BID) | ORAL | 0 refills | Status: DC

## 2017-01-04 MED FILL — ISOSORBIDE MN ER 60 MG TAB: 60 | 90 days supply | Qty: 180 | Fill #0

## 2017-01-04 MED FILL — VENTOLIN HFA 90 MCG INHALER: 108 (90 BAS | 30 days supply | Qty: 18 | Fill #0

## 2017-01-04 MED FILL — MIDODRINE HCL 10 MG TABLET: 10 | 30 days supply | Qty: 90 | Fill #2

## 2017-01-04 MED FILL — HYDROCODONE-HOMATROPINE SYR: 5-1.5 | 8 days supply | Qty: 120 | Fill #0

## 2017-01-04 MED FILL — AMOX-CLAV 875-125 MG TABLET: 875-125 | 10 days supply | Qty: 20 | Fill #0

## 2017-01-04 NOTE — Patient Instructions (Signed)
Community-Acquired Pneumonia, Adult °Introduction °Pneumonia is an infection of the lungs. One type of pneumonia can happen while a person is in a hospital. A different type can happen when a person is not in a hospital (community-acquired pneumonia). It is easy for this kind to spread from person to person. It can spread to you if you breathe near an infected person who coughs or sneezes. Some symptoms include: °· A dry cough. °· A wet (productive) cough. °· Fever. °· Sweating. °· Chest pain. °Follow these instructions at home: °· Take over-the-counter and prescription medicines only as told by your doctor. °¨ Only take cough medicine if you are losing sleep. °¨ If you were prescribed an antibiotic medicine, take it as told by your doctor. Do not stop taking the antibiotic even if you start to feel better. °· Sleep with your head and neck raised (elevated). You can do this by putting a few pillows under your head, or you can sleep in a recliner. °· Do not use tobacco products. These include cigarettes, chewing tobacco, and e-cigarettes. If you need help quitting, ask your doctor. °· Drink enough water to keep your pee (urine) clear or pale yellow. °A shot (vaccine) can help prevent pneumonia. Shots are often suggested for: °· People older than 69 years of age. °· People older than 69 years of age: °¨ Who are having cancer treatment. °¨ Who have long-term (chronic) lung disease. °¨ Who have problems with their body's defense system (immune system). °You may also prevent pneumonia if you take these actions: °· Get the flu (influenza) shot every year. °· Go to the dentist as often as told. °· Wash your hands often. If soap and water are not available, use hand sanitizer. °Contact a doctor if: °· You have a fever. °· You lose sleep because your cough medicine does not help. °Get help right away if: °· You are short of breath and it gets worse. °· You have more chest pain. °· Your sickness gets worse. This is very  serious if: °¨ You are an older adult. °¨ Your body's defense system is weak. °· You cough up blood. °This information is not intended to replace advice given to you by your health care provider. Make sure you discuss any questions you have with your health care provider. °Document Released: 05/10/2008 Document Revised: 04/29/2016 Document Reviewed: 03/19/2015 °© 2017 Elsevier ° °

## 2017-01-04 NOTE — Progress Notes (Signed)
Pre visit review using our clinic review tool, if applicable. No additional management support is needed unless otherwise documented below in the visit note. 

## 2017-01-05 ENCOUNTER — Ambulatory Visit (INDEPENDENT_AMBULATORY_CARE_PROVIDER_SITE_OTHER): Payer: Medicare PPO | Admitting: Cardiovascular Disease

## 2017-01-05 DIAGNOSIS — I480 Paroxysmal atrial fibrillation: Secondary | ICD-10-CM

## 2017-01-09 DIAGNOSIS — R791 Abnormal coagulation profile: Secondary | ICD-10-CM | POA: Insufficient documentation

## 2017-01-09 DIAGNOSIS — G40909 Epilepsy, unspecified, not intractable, without status epilepticus: Secondary | ICD-10-CM

## 2017-01-09 HISTORY — DX: Epilepsy, unspecified, not intractable, without status epilepticus: G40.909

## 2017-01-09 NOTE — Assessment & Plan Note (Signed)
minimize simple carbs. Increase exercise as tolerated. Continue current meds  

## 2017-01-09 NOTE — Assessment & Plan Note (Signed)
Over 5 in ED repeat today down to 3.7 continue to hold coumadin and follow up with coumadin clinic today about timing of next check

## 2017-01-09 NOTE — Progress Notes (Signed)
Patient ID: [name] [name], male   DOB: 07/24/1946, 71 y.o.   MRN: [id number]   Subjective:    Patient ID: [name] [name], male    DOB: 18-Dec-1945, 71 y.o.   MRN: [id number]  Chief Complaint  Patient presents with  . Hospitalization Follow-up    HPI Patient is in today for hospital follow up. Unclear if he had a true seizure but his family reports he was unresponsive and then appears to have had a post ictal state. He has been ill with influenza with chills, malaise, poor appetite and has not been eating well. He also notes congesion, cough and malaise. Has skipped his insulin since ER due to low sugars but reports his sugar is back up to 252 today. No polyuria or polydipsia.  Past Medical History:  Diagnosis Date  . ACP (advance care planning) 05/11/2015  . Advance care planning 05/01/2015  . Anemia    hemoglobin 7.4, iron deficiency, January, 2011, 2 unit transfusion, endoscopy normal, capsule endoscopy February, 2011 no small bowel abnormalities.   Most likely source gastric erosions, followed by GI  . Anxiety   . CAD (coronary artery disease)    A. CABG in 2000,status post cardiac cath in 2006, 2009 ....continued chest pain and SOB despite oral medication adjestments including Ranexa. B. Cath November 2009/ mRCA - 2.75 x 23 Abbott Xience V drug-eluting stent ...11/26/2008 to distal  RCA leading to acute marginal.  C. Cath 07/2012 for CP - stable anatomy, med rx. d. cath 2015 and 05/30/2015 stable anatomy, consider Myoview if has CP again  . Cerebral ischemia    MRI November, 2010, chronic microvascular ischemia  . Chronic diastolic CHF (congestive heart failure) ([place]) 03/13/2016  . Depression    Bipolar  . Diabetes mellitus type II, uncontrolled ([place])   . Diabetes mellitus without complication ([place])   . Diarrhea 02/15/2016  . Dizziness   . Edema   . Ejection fraction    EF 60%, echo, July 31, 2012  . Falling episodes    these have occurred in the past and again recurring 2011  .  Family history of adverse reaction to anesthesia    "mother died during bypass surgery but not sure if it has to do with anesthesia"  . Gastric ulcer   . GERD (gastroesophageal reflux disease)   . H/O medication noncompliance    Due to loss of insurance  . Hard of hearing   . Headache 01/25/2016  . Hx of CABG    2000,  / one median sternotomy suture broken her chest x-ray November, 2010, no clinical significance  . Hyperlipidemia   . Hypertension    pt. denies  . Iron deficiency anemia   . Low back pain 06/12/2009   Qualifier: Diagnosis of  By: [name]   . Nausea with vomiting 01/25/2016  . Nephrolithiasis   . OSA (obstructive sleep apnea)   . Pain in limb 06/12/2009   Qualifier: Diagnosis of  By: [name]   . Palpitations    event recorder showed sinus rhythm  . RBBB (right bundle branch block)   . Restless leg   . Right knee pain 01/07/2015  . Shortness of breath    CPX April, 2011, mild functional limitation, no clear pulmonary or cardiac limitation, possible deconditioning and mild chronotropic incompetence( peak heart rate 130)  . Spondylosis    C5-6, C6-7 MRI 2010  . Syncope 03/2016  . Thyroid disease   . Tubulovillous adenoma  of colon 2007  . Wears glasses     Past Surgical History:  Procedure Laterality Date  . ANTERIOR CERVICAL DECOMP/DISCECTOMY FUSION N/A 05/31/2016   Procedure: ANTERIOR CERVICAL DECOMPRESSION/DISCECTOMY FUSION CERVICAL FIVE-SIX,CERVICAL SIX-SEVEN;  Surgeon: [name], MD;  Location: [facility];  Service: Neurosurgery;  Laterality: N/A;  . CARDIAC CATHETERIZATION N/A 05/30/2015   Procedure: Left Heart Cath and Coronary Angiography;  Surgeon: [name], MD;  Location: [facility];  Service: Cardiovascular;  Laterality: N/A;  . COLONOSCOPY    . CORONARY ARTERY BYPASS GRAFT     2000  . ESOPHAGOGASTRODUODENOSCOPY    . LEFT HEART CATHETERIZATION WITH CORONARY/GRAFT ANGIOGRAM N/A 08/01/2012   Procedure: LEFT HEART  CATHETERIZATION WITH Beatrix Fetters;  Surgeon: [name], MD;  Location: [facility] CATH LAB;  Service: Cardiovascular;  Laterality: N/A;  . LEFT HEART CATHETERIZATION WITH CORONARY/GRAFT ANGIOGRAM N/A 01/03/2015   Procedure: LEFT HEART CATHETERIZATION WITH Beatrix Fetters;  Surgeon: [name], MD;  Location: [facility] CATH LAB;  Service: Cardiovascular;  Laterality: N/A;  . NASAL SEPTUM SURGERY     UP3  . PERCUTANEOUS CORONARY STENT INTERVENTION (PCI-S)  10/2008   mRCA PCI  2.75 x 23 Abbott Xience V drug-eluting stent     Family History  Problem Relation Age of Onset  . Pancreatic cancer Brother   . Diabetes Brother     4   . Stroke Brother   . Diabetes Mother   . Heart failure Mother   . Heart failure Father   . Cancer Brother   . Coronary artery disease Brother   . Hypothyroidism Brother   . Heart attack      Nephew  . Irregular heart beat Daughter   . Cancer Maternal Grandmother     unknown   . Other Brother     colon surgery    Social History   Social History  . Marital status: Married    Spouse name: N/A  . Number of children: 4  . Years of education: 75   Occupational History  . retired    Social History Main Topics  . Smoking status: Never Smoker  . Smokeless tobacco: Never Used  . Alcohol use No     Comment: stopped drinking in 1998  . Drug use: No  . Sexual activity: Yes    Partners: Female   Other Topics Concern  . Not on file   Social History Narrative   Patient is right handed.   Patient does not drink caffeine.    Outpatient Medications Prior to Visit  Medication Sig Dispense Refill  . amiodarone (PACERONE) 400 MG tablet Take 1 tablet (400 mg total) by mouth daily. 30 tablet 0  . atorvastatin (LIPITOR) 40 MG tablet TAKE 1 TABLET BY MOUTH DAILY AT 6PM 30 tablet 5  . clotrimazole-betamethasone (LOTRISONE) cream Apply 1 application topically 2 (two) times daily.    . cyclobenzaprine (FLEXERIL) 10 MG tablet Take 1 tablet (10 mg  total) by mouth 3 (three) times daily as needed for muscle spasms. 30 tablet 0  . dextromethorphan (ROBITUSSIN 12 HOUR COUGH) 30 MG/5ML liquid Take 2.5 mLs (15 mg total) by mouth 2 (two) times daily as needed for cough. 89 mL 0  . diclofenac sodium (VOLTAREN) 1 % GEL Apply 2 g topically daily as needed (for pain in affected area).    . gabapentin (NEURONTIN) 300 MG capsule Take 2 capsules (600 mg) by mouth  every morning, 1 capsule (300 mg) by mouth  at  noon, and 2 capsules (600 mg) by mouth at night    . glucose blood (ACCU-CHEK AVIVA) test strip Use as directed to check blood sugar 4 times daily.  DX E11.9 200 each 6  . HYDROcodone-acetaminophen (NORCO/VICODIN) 5-325 MG tablet Take 1-2 tablets by mouth every 4 (four) hours as needed (mild pain). 80 tablet 0  . insulin aspart (NOVOLOG) 100 UNIT/ML injection Inject 10-17 Units into the skin 3 (three) times daily with meals. Use 10-17 unit 3 times daily before meals 15 mL 4  . Insulin Glargine (TOUJEO SOLOSTAR) 300 UNIT/ML SOPN Inject 42 Units into the skin at bedtime. (Patient taking differently: Inject 48 Units into the skin at bedtime. ) 3 pen 4  . isosorbide mononitrate (IMDUR) 120 MG 24 hr tablet Take 120 mg by mouth daily.    . isosorbide mononitrate (IMDUR) 60 MG 24 hr tablet TAKE TWO TABLETS BY MOUTH DAILY 180 tablet 0  . levothyroxine (SYNTHROID, LEVOTHROID) 88 MCG tablet Take 1 tablet (88 mcg total) by mouth daily before breakfast. 30 tablet 3  . meclizine (ANTIVERT) 12.5 MG tablet Take 1 tablet (12.5 mg total) by mouth 3 (three) times daily as needed for dizziness. 10 tablet 0  . metFORMIN (GLUCOPHAGE) 1000 MG tablet TAKE 1 TABLET (1,000 MG TOTAL) BY MOUTH 2 (TWO) TIMES DAILY WITH A MEAL. 60 tablet 5  . metoprolol succinate (TOPROL-XL) 50 MG 24 hr tablet Take 1 and a half tablets once a day for a total of 75 mg. (Patient taking differently: Take 75 mg by mouth daily. ) 45 tablet 6  . midodrine (PROAMATINE) 10 MG tablet Take 1 tablet (10 mg  total) by mouth 3 (three) times daily. 90 tablet 3  . nitroGLYCERIN (NITROSTAT) 0.4 MG SL tablet Place 0.4 mg under the tongue every 5 (five) minutes as needed for chest pain.    . pantoprazole (PROTONIX) 40 MG tablet TAKE 1 TABLET BY MOUTH TWICE DAILY FOR 2 WEEKS THEN DROP TO ONCE DAILY 45 tablet 5  . pramipexole (MIRAPEX) 1 MG tablet Take 1 mg by mouth at bedtime.    . sertraline (ZOLOFT) 100 MG tablet Take 1 tablet (100 mg total) by mouth 2 (two) times daily. 180 tablet 1  . tiZANidine (ZANAFLEX) 4 MG tablet Take 1 tablet (4 mg total) by mouth every 8 (eight) hours as needed for muscle spasms. 90 tablet 2  . topiramate (TOPAMAX) 50 MG tablet TAKE 1 TABLET (50 MG) BY MOUTH AT BEDTIME. 30 tablet 3  . warfarin (COUMADIN) 5 MG tablet Take as directed by [facility] (Patient taking differently: Take 5-7.5 mg by mouth See admin instructions. Take 1 tablet every day except on Friday and Saturday then take 1 and 1/2 tablets) 35 tablet 3  . HYDROcodone-homatropine (HYCODAN) 5-1.5 MG/5ML syrup Take 5 mLs by mouth every 8 (eight) hours as needed for cough. 120 mL 0   No facility-administered medications prior to visit.     Allergies  Allergen Reactions  . Morphine Other (See Comments)    hallucinations  . Shellfish-Derived Products Itching    Review of Systems  Constitutional: Positive for chills and malaise/fatigue. Negative for fever.  Eyes: Negative for blurred vision.  Respiratory: Positive for cough. Negative for shortness of breath.   Cardiovascular: Negative for chest pain, palpitations and leg swelling.  Gastrointestinal: Negative for abdominal pain, blood in stool and nausea.  Genitourinary: Negative for dysuria and frequency.  Musculoskeletal: Positive for myalgias. Negative for falls.  Skin: Negative for rash.  Neurological:  Positive for dizziness, seizures and loss of consciousness. Negative for headaches.  Endo/Heme/Allergies: Negative for environmental allergies.    Psychiatric/Behavioral: Negative for depression. The patient is not nervous/anxious.        Objective:    Physical Exam  Constitutional: He is oriented to person, place, and time. He appears well-developed and well-nourished. No distress.  HENT:  Head: Normocephalic and atraumatic.  Nose: Nose normal.  Dry mucus membranes.   Eyes: Right eye exhibits no discharge. Left eye exhibits no discharge.  Neck: Normal range of motion. Neck supple.  Cardiovascular: Normal rate.   No murmur heard. Irregularly irregular.  Pulmonary/Chest: Effort normal and breath sounds normal.  Abdominal: Soft. Bowel sounds are normal. There is no tenderness.  Musculoskeletal: He exhibits no edema.  Neurological: He is alert and oriented to person, place, and time.  Skin: Skin is warm and dry.  Psychiatric: He has a normal mood and affect.  Nursing note and vitals reviewed.   BP 120/62 (BP Location: Left Arm, Patient Position: Sitting, Cuff Size: Normal)   Pulse 68   Temp 97.7 F (36.5 C) (Oral)   Ht 5\' 9"  (1.753 m)   Wt 161 lb 6 oz (73.2 kg)   SpO2 95%   BMI 23.83 kg/m  Wt Readings from Last 3 Encounters:  01/04/17 161 lb 6 oz (73.2 kg)  12/30/16 168 lb (76.2 kg)  12/24/16 170 lb (77.1 kg)     Lab Results  Component Value Date   WBC 7.3 01/04/2017   HGB 13.9 01/04/2017   HCT 39.3 01/04/2017   PLT 293.0 01/04/2017   GLUCOSE 299 (H) 01/04/2017   CHOL 78 09/30/2016   TRIG 153.0 (H) 09/30/2016   HDL 30.60 (L) 09/30/2016   LDLDIRECT 25.0 02/09/2016   LDLCALC 17 09/30/2016   ALT 14 01/04/2017   AST 12 01/04/2017   NA 135 01/04/2017   K 4.1 01/04/2017   CL 102 01/04/2017   CREATININE 1.12 01/04/2017   BUN 14 01/04/2017   CO2 26 01/04/2017   TSH 6.00 (H) 09/30/2016   PSA 2.39 11/25/2010   INR 3.7 (H) 01/04/2017   HGBA1C 10.2 (H) 09/30/2016   MICROALBUR 1.1 05/01/2015    Lab Results  Component Value Date   TSH 6.00 (H) 09/30/2016   Lab Results  Component Value Date   WBC 7.3  01/04/2017   HGB 13.9 01/04/2017   HCT 39.3 01/04/2017   MCV 85.5 01/04/2017   PLT 293.0 01/04/2017   Lab Results  Component Value Date   NA 135 01/04/2017   K 4.1 01/04/2017   CO2 26 01/04/2017   GLUCOSE 299 (H) 01/04/2017   BUN 14 01/04/2017   CREATININE 1.12 01/04/2017   BILITOT 0.9 01/04/2017   ALKPHOS 79 01/04/2017   AST 12 01/04/2017   ALT 14 01/04/2017   PROT 8.0 01/04/2017   ALBUMIN 4.3 01/04/2017   CALCIUM 9.5 01/04/2017   ANIONGAP 8 01/02/2017   GFR 69.23 01/04/2017   Lab Results  Component Value Date   CHOL 78 09/30/2016   Lab Results  Component Value Date   HDL 30.60 (L) 09/30/2016   Lab Results  Component Value Date   LDLCALC 17 09/30/2016   Lab Results  Component Value Date   TRIG 153.0 (H) 09/30/2016   Lab Results  Component Value Date   CHOLHDL 3 09/30/2016   Lab Results  Component Value Date   HGBA1C 10.2 (H) 09/30/2016       Assessment & Plan:   Problem  List Items Addressed This Visit    Essential hypertension (Chronic)    Well controlled, no changes to meds. Encouraged heart healthy diet such as the DASH diet and exercise as tolerated.       Diabetes mellitus type II, uncontrolled ([place]) (Chronic)     minimize simple carbs. Increase exercise as tolerated. Continue current meds      Viral respiratory illness    Possible that recent influenza altered his seizure threshold he is advised to continue to rest and hydrate.      Seizure disorder ([place])    Unclear if he had a true seizure but his family reports he was unresponsive and then appears to have had a post ictal state. Has a neurologist so will refer back to urology      Relevant Orders   Ambulatory referral to Neurology   Elevated INR - Primary    Over 5 in ED repeat today down to 3.7 continue to hold coumadin and follow up with coumadin clinic today about timing of next check      Relevant Orders   INR/PT (Completed)    Other Visit Diagnoses    Influenza        Relevant Orders   Comprehensive metabolic panel (Completed)   CBC w/Diff (Completed)      I am having Mr. Kujawa start on albuterol and amoxicillin-clavulanate. I am also having him maintain his tiZANidine, glucose blood, pramipexole, gabapentin, atorvastatin, insulin aspart, Insulin Glargine, isosorbide mononitrate, clotrimazole-betamethasone, diclofenac sodium, nitroGLYCERIN, metFORMIN, cyclobenzaprine, HYDROcodone-acetaminophen, pantoprazole, topiramate, sertraline, metoprolol succinate, amiodarone, midodrine, meclizine, levothyroxine, warfarin, dextromethorphan, isosorbide mononitrate, and HYDROcodone-homatropine.  Meds ordered this encounter  Medications  . HYDROcodone-homatropine (HYCODAN) 5-1.5 MG/5ML syrup    Sig: Take 5 mLs by mouth every 8 (eight) hours as needed for cough.    Dispense:  120 mL    Refill:  0  . albuterol (PROVENTIL HFA;VENTOLIN HFA) 108 (90 Base) MCG/ACT inhaler    Sig: Inhale 2 puffs into the lungs every 6 (six) hours as needed for wheezing or shortness of breath.    Dispense:  1 Inhaler    Refill:  0  . amoxicillin-clavulanate (AUGMENTIN) 875-125 MG tablet    Sig: Take 1 tablet by mouth 2 (two) times daily.    Dispense:  20 tablet    Refill:  0      Penni Homans, MD

## 2017-01-09 NOTE — Assessment & Plan Note (Signed)
Possible that recent influenza altered his seizure threshold he is advised to continue to rest and hydrate.

## 2017-01-09 NOTE — Assessment & Plan Note (Signed)
Well controlled, no changes to meds. Encouraged heart healthy diet such as the DASH diet and exercise as tolerated.  °

## 2017-01-09 NOTE — Assessment & Plan Note (Addendum)
Unclear if he had a true seizure but his family reports he was unresponsive and then appears to have had a post ictal state. Has a neurologist so will refer back to neurology

## 2017-01-13 ENCOUNTER — Other Ambulatory Visit: Payer: Self-pay | Admitting: Family Medicine

## 2017-01-13 MED FILL — WARFARIN SODIUM 5 MG TABLET: 5 | 30 days supply | Qty: 35 | Fill #2

## 2017-01-13 MED FILL — PRAMIPEXOLE 1 MG TABLET: 1 | 30 days supply | Qty: 30 | Fill #0

## 2017-01-14 ENCOUNTER — Ambulatory Visit (INDEPENDENT_AMBULATORY_CARE_PROVIDER_SITE_OTHER): Payer: Medicare PPO | Admitting: Pharmacist

## 2017-01-14 DIAGNOSIS — Z5181 Encounter for therapeutic drug level monitoring: Secondary | ICD-10-CM | POA: Diagnosis not present

## 2017-01-14 DIAGNOSIS — I480 Paroxysmal atrial fibrillation: Secondary | ICD-10-CM

## 2017-01-14 LAB — POCT INR: INR: 2.3

## 2017-01-18 ENCOUNTER — Telehealth: Payer: Self-pay | Admitting: *Deleted

## 2017-01-18 NOTE — Telephone Encounter (Signed)
Declines AWV at this time.

## 2017-01-21 ENCOUNTER — Ambulatory Visit (INDEPENDENT_AMBULATORY_CARE_PROVIDER_SITE_OTHER): Payer: Medicare PPO | Admitting: Neurology

## 2017-01-21 ENCOUNTER — Ambulatory Visit (INDEPENDENT_AMBULATORY_CARE_PROVIDER_SITE_OTHER): Payer: Medicare PPO | Admitting: Family Medicine

## 2017-01-21 ENCOUNTER — Encounter: Payer: Self-pay | Admitting: Family Medicine

## 2017-01-21 ENCOUNTER — Telehealth: Payer: Self-pay | Admitting: Family Medicine

## 2017-01-21 ENCOUNTER — Encounter: Payer: Self-pay | Admitting: Neurology

## 2017-01-21 VITALS — BP 110/68 | HR 58 | Temp 98.0°F | Wt 165.8 lb

## 2017-01-21 VITALS — BP 124/64 | HR 61 | Ht 69.0 in | Wt 166.4 lb

## 2017-01-21 DIAGNOSIS — E039 Hypothyroidism, unspecified: Secondary | ICD-10-CM

## 2017-01-21 DIAGNOSIS — G4486 Cervicogenic headache: Secondary | ICD-10-CM

## 2017-01-21 DIAGNOSIS — R51 Headache: Secondary | ICD-10-CM | POA: Diagnosis not present

## 2017-01-21 DIAGNOSIS — Z794 Long term (current) use of insulin: Secondary | ICD-10-CM

## 2017-01-21 DIAGNOSIS — I480 Paroxysmal atrial fibrillation: Secondary | ICD-10-CM

## 2017-01-21 DIAGNOSIS — I10 Essential (primary) hypertension: Secondary | ICD-10-CM

## 2017-01-21 DIAGNOSIS — R569 Unspecified convulsions: Secondary | ICD-10-CM | POA: Diagnosis not present

## 2017-01-21 DIAGNOSIS — I5032 Chronic diastolic (congestive) heart failure: Secondary | ICD-10-CM

## 2017-01-21 DIAGNOSIS — E782 Mixed hyperlipidemia: Secondary | ICD-10-CM

## 2017-01-21 DIAGNOSIS — R55 Syncope and collapse: Secondary | ICD-10-CM | POA: Diagnosis not present

## 2017-01-21 DIAGNOSIS — G40909 Epilepsy, unspecified, not intractable, without status epilepticus: Secondary | ICD-10-CM | POA: Diagnosis not present

## 2017-01-21 DIAGNOSIS — F99 Mental disorder, not otherwise specified: Secondary | ICD-10-CM

## 2017-01-21 DIAGNOSIS — E1100 Type 2 diabetes mellitus with hyperosmolarity without nonketotic hyperglycemic-hyperosmolar coma (NKHHC): Secondary | ICD-10-CM | POA: Diagnosis not present

## 2017-01-21 DIAGNOSIS — R410 Disorientation, unspecified: Secondary | ICD-10-CM

## 2017-01-21 DIAGNOSIS — R519 Headache, unspecified: Secondary | ICD-10-CM

## 2017-01-21 LAB — COMPREHENSIVE METABOLIC PANEL
ALBUMIN: 4.3 g/dL (ref 3.6–5.1)
ALK PHOS: 68 U/L (ref 40–115)
ALT: 10 U/L (ref 9–46)
AST: 14 U/L (ref 10–35)
BILIRUBIN TOTAL: 0.4 mg/dL (ref 0.2–1.2)
BUN: 14 mg/dL (ref 7–25)
CHLORIDE: 101 mmol/L (ref 98–110)
CO2: 26 mmol/L (ref 20–31)
CREATININE: 1.06 mg/dL (ref 0.70–1.25)
Calcium: 9.3 mg/dL (ref 8.6–10.3)
Glucose, Bld: 206 mg/dL — ABNORMAL HIGH (ref 65–99)
Potassium: 4.3 mmol/L (ref 3.5–5.3)
SODIUM: 138 mmol/L (ref 135–146)
TOTAL PROTEIN: 7.1 g/dL (ref 6.1–8.1)

## 2017-01-21 LAB — LIPID PANEL
CHOL/HDL RATIO: 4.1 ratio (ref <5.0)
CHOLESTEROL: 152 mg/dL (ref <200)
HDL: 37 mg/dL — ABNORMAL LOW (ref >40)
LDL Cholesterol: 59 mg/dL (ref <100)
Triglycerides: 280 mg/dL — ABNORMAL HIGH (ref <150)
VLDL: 56 mg/dL — ABNORMAL HIGH (ref <30)

## 2017-01-21 LAB — CBC
HCT: 37.6 % — ABNORMAL LOW (ref 38.5–50.0)
HEMOGLOBIN: 12.3 g/dL — AB (ref 13.2–17.1)
MCH: 29 pg (ref 27.0–33.0)
MCHC: 32.7 g/dL (ref 32.0–36.0)
MCV: 88.7 fL (ref 80.0–100.0)
MPV: 11.7 fL (ref 7.5–12.5)
PLATELETS: 173 10*3/uL (ref 140–400)
RBC: 4.24 MIL/uL (ref 4.20–5.80)
RDW: 15.2 % — ABNORMAL HIGH (ref 11.0–15.0)
WBC: 5.9 10*3/uL (ref 3.8–10.8)

## 2017-01-21 LAB — TSH: TSH: 3.97 m[IU]/L (ref 0.40–4.50)

## 2017-01-21 MED ORDER — DIVALPROEX SODIUM ER 500 MG PO TB24: 500.0000 mg | ORAL_TABLET | Freq: Every day | ORAL | 2 refills | Status: DC

## 2017-01-21 MED FILL — DIVALPROEX SOD ER 500 MG TA: 500 | 30 days supply | Qty: 30 | Fill #0

## 2017-01-21 NOTE — Assessment & Plan Note (Signed)
hgba1c acceptable, minimize simple carbs. Increase exercise as tolerated. Continue current meds 

## 2017-01-21 NOTE — Telephone Encounter (Signed)
05/01/15 PR PPPS, SUBSEQ VISIT [[id number]] patient scheduled for 03/17/17 at 2pm with [facility]

## 2017-01-21 NOTE — Assessment & Plan Note (Signed)
On Levothyroxine, continue to monitor 

## 2017-01-21 NOTE — Progress Notes (Signed)
Pre visit review using our clinic review tool, if applicable. No additional management support is needed unless otherwise documented below in the visit note. 

## 2017-01-21 NOTE — Patient Instructions (Signed)
Carbohydrate Counting for Diabetes Mellitus, Adult Carbohydrate counting is a method for keeping track of how many carbohydrates you eat. Eating carbohydrates naturally increases the amount of sugar (glucose) in the blood. Counting how many carbohydrates you eat helps keep your blood glucose within normal limits, which helps you manage your diabetes (diabetes mellitus). It is important to know how many carbohydrates you can safely have in each meal. This is different for every person. A diet and nutrition specialist (registered dietitian) can help you make a meal plan and calculate how many carbohydrates you should have at each meal and snack. Carbohydrates are found in the following foods:  Grains, such as breads and cereals.  Dried beans and soy products.  Starchy vegetables, such as potatoes, peas, and corn.  Fruit and fruit juices.  Milk and yogurt.  Sweets and snack foods, such as cake, cookies, candy, chips, and soft drinks. How do I count carbohydrates? There are two ways to count carbohydrates in food. You can use either of the methods or a combination of both. Reading "Nutrition Facts" on packaged food  The "Nutrition Facts" list is included on the labels of almost all packaged foods and beverages in the [place] It includes:  The serving size.  Information about nutrients in each serving, including the grams (g) of carbohydrate per serving. To use the "Nutrition Facts":  Decide how many servings you will have.  Multiply the number of servings by the number of carbohydrates per serving.  The resulting number is the total amount of carbohydrates that you will be having. Learning standard serving sizes of other foods  When you eat foods containing carbohydrates that are not packaged or do not include "Nutrition Facts" on the label, you need to measure the servings in order to count the amount of carbohydrates:  Measure the foods that you will eat with a food scale or measuring  cup, if needed.  Decide how many standard-size servings you will eat.  Multiply the number of servings by 15. Most carbohydrate-rich foods have about 15 g of carbohydrates per serving.  For example, if you eat 8 oz (170 g) of strawberries, you will have eaten 2 servings and 30 g of carbohydrates (2 servings x 15 g = 30 g).  For foods that have more than one food mixed, such as soups and casseroles, you must count the carbohydrates in each food that is included. The following list contains standard serving sizes of common carbohydrate-rich foods. Each of these servings has about 15 g of carbohydrates:   hamburger bun or  English muffin.   oz (15 mL) syrup.   oz (14 g) jelly.  1 slice of bread.  1 six-inch tortilla.  3 oz (85 g) cooked rice or pasta.  4 oz (113 g) cooked dried beans.  4 oz (113 g) starchy vegetable, such as peas, corn, or potatoes.  4 oz (113 g) hot cereal.  4 oz (113 g) mashed potatoes or  of a large baked potato.  4 oz (113 g) canned or frozen fruit.  4 oz (120 mL) fruit juice.  4-6 crackers.  6 chicken nuggets.  6 oz (170 g) unsweetened dry cereal.  6 oz (170 g) plain fat-free yogurt or yogurt sweetened with artificial sweeteners.  8 oz (240 mL) milk.  8 oz (170 g) fresh fruit or one small piece of fruit.  24 oz (680 g) popped popcorn. Example of carbohydrate counting Sample meal  3 oz (85 g) chicken breast.  6 oz (  170 g) brown rice.  4 oz (113 g) corn.  8 oz (240 mL) milk.  8 oz (170 g) strawberries with sugar-free whipped topping. Carbohydrate calculation 1. Identify the foods that contain carbohydrates:  Rice.  Corn.  Milk.  Strawberries. 2. Calculate how many servings you have of each food:  2 servings rice.  1 serving corn.  1 serving milk.  1 serving strawberries. 3. Multiply each number of servings by 15 g:  2 servings rice x 15 g = 30 g.  1 serving corn x 15 g = 15 g.  1 serving milk x 15 g = 15  g.  1 serving strawberries x 15 g = 15 g. 4. Add together all of the amounts to find the total grams of carbohydrates eaten:  30 g + 15 g + 15 g + 15 g = 75 g of carbohydrates total. This information is not intended to replace advice given to you by your health care provider. Make sure you discuss any questions you have with your health care provider. Document Released: 11/22/2005 Document Revised: 06/11/2016 Document Reviewed: 05/05/2016 Elsevier Interactive Patient Education  2017 [employer]  

## 2017-01-21 NOTE — Patient Instructions (Signed)
1.  Regarding headaches, we will start Depakote ER 500mg  daily (may take at bedtime).  This will treat seizures as well.  We will recheck CBC with diff and hepatic panel in 3 months prior to follow up. 2.  We will get neurocognitive testing. 3.  No driving as per [facility]. 4.  Follow up in 3 months.

## 2017-01-21 NOTE — Progress Notes (Signed)
NEUROLOGY FOLLOW UP OFFICE NOTE  [name] [name] [id number]  HISTORY OF PRESENT ILLNESS: [name] [name] is a 71 year old right-handed man with type 2 diabetes mellitus, hypothyroidism, OSA, hyperlipidemia, coronary artery disease, and hypertension who follows up for cervicogenic headache and recurrent syncope.  UPDATE: He presented to the ED twice last month.  On 12/24/16, he was found to have the flu.  He returned to the ED on 01/02/17 after his wife woke up and found him having seizure-like activity while sleeping.  He had urinary incontinence but did not bite his tongue.  His wife cannot tell me the duration of shaking but he was confused afterwards for a while.  CT of head was personally reviewed and was unremarkable.  He has not had any prior witnessed seizures but he said that on at least one other occasion, he had woken up having wet the bed.  He continues to have episodes of confusion and memory loss.  He will suddenly not remember where he is.  Today, he did not remember that the [facility] had taken his vitals.  One time, he was driving to [place] from [facility] but got lost and ended up in [place]. [place].  Headaches have increased as well. He sometimes reports double vision.  He reports difficulty initiating walking when he first stands up.  He reports he sometimes shuffles and sometimes has a tremor in his hands, especially on the left.  He takes midodrine for orthostatic hypotension.  He takes gabapentin 600mg  in AM, 300mg  in afternoon and 600mg  at night.   12/24/16 Labs:  CMP with Na 134, K 3.3, glucose 136, BUN 16, Cr 1.13, total bili 1, ALP 69, AST 27, ALT 18. 01/04/17 Labs: CBC with WBC 7.3, HGB 13.9, HCT 39.3, PLT 293.  HISTORY: Cervicogenic headache/occipital neuralgia: In March, he was struck several times in the face mask while playing catcher in a baseball game over the course of two games.   At least one of the times, he was hit in the left eye.  He doesn't think he lost  consciousness, but it did knock him down to the ground.  The next day, he began to feel nauseous and diaphoretic.  He went home and passed out of the bed.  EMS was called and he was brought to the ED, where CT of the head was unremarkable.  CT of the cervical spine showed C6-7 degenerative disc disease but otherwise unremarkable.  He also developed a headache.  It starts in the back of his head and travels up to the forehead.  It is a "hurting" pain and his eyes hurt.  It can be anywhere from 3 to 9/10.  It lasts from 30 to 60 minutes and occurs three times a day.  It is associated with nausea, photophobia, phonophobia and some blurred vision.  It causes him to stagger.  It is not positional.  He also notes pain in the right suboccipital region, which causes numbness and tingling traveling up the back of his head.  He notes that his neck feels stiff.  Due to persistent headache, he had another CT of the head performed on 02/26/15, which was stable.  Occipital nerve block has helped.  Due to persistent occipital headaches and worsening arm weakness, he had a repeat MRI of cervical spine on 05/01/16 which was personally reviewed and revealed multilevel foraminal stenosis at C3-4, C4-5, C5-6 and C6-7.  He underwent anterior cervical decompression and fusion in June.  Recurrent syncope and  confusion: He has been experiencing episodes of syncope and confusion.  He will suddenly feel lightheaded, diaphoretic, experience palpitations and then tunnel vision.  He will then pass out.  He doesn't actually remember falling.  He is unconscious very briefly but he appears to be in a deep sleep.  There are no convulsions, bowel or bladder incontinence or tongue biting.  When he wakes up, he is confused and slurs his speech from 30 to 60 minutes.  If he is up afterwards, he is unsteady on his feet.  It happens at rest but is much worse if he stands up.  This can occur almost daily.  In addition, he appears to have memory issues.   He often misplaces items such as his wallet.  He will put down one cup in the kitchen and then grab for another cup, forgetting that he already had one.  He is not acting like himself.  He seems to be having more vivid dreams.  He will wake up from sleep, still acting out from his dream.  One night, he woke up calling for his deceased father and brother.  Another time, he woke up in the middle of the night when his wife got up to go use the restroom.  He sat up confused and asked where he was and where home is.  He has had multiple hospital visits with extensive workup.  Blood pressure was noted to be as low as 70s/40s with pulse dropping into the 60s.  MRI of brain from 03/09/16 was unremarkable for acute abnormality.  CTA of head and neck was unremarkable.  Echo showed EF 55-60% with grade 1 diastolic dysfunction.  Routine EEG was normal.  Telemetry revealed no arrhythmias.  He was noted to be bradycardic with HR in the 50s, so his beta blocker dose was reduced.  Diagnosis was unclear but his ASA was switched to Plavix.   He underwent long term EEG monitoring for 24 hours, which was normal but no event was captured.  Repeat routine EEG on 03/18/16 was also normal.   72 hour ambulatory EEG from 04/05/16 to 04/08/16 was normal.  He did exhibit episodes of palpitations and dizziness without electrographic correlate but did not have any syncopal events.  He had a 48 hour Holter monitor which revealed NSR with occasional PVCs, but no pathologic arrhythmias.  He was then hooked up to an event monitor which demonstrated atrial fibrillation and flutter.  For orthostasis, he takes midodrine.  PAST MEDICAL HISTORY: Past Medical History:  Diagnosis Date  . ACP (advance care planning) 05/11/2015  . Advance care planning 05/01/2015  . Anemia    hemoglobin 7.4, iron deficiency, January, 2011, 2 unit transfusion, endoscopy normal, capsule endoscopy February, 2011 no small bowel abnormalities.   Most likely source gastric erosions,  followed by GI  . Anxiety   . CAD (coronary artery disease)    A. CABG in 2000,status post cardiac cath in 2006, 2009 ....continued chest pain and SOB despite oral medication adjestments including Ranexa. B. Cath November 2009/ mRCA - 2.75 x 23 Abbott Xience V drug-eluting stent ...11/26/2008 to distal  RCA leading to acute marginal.  C. Cath 07/2012 for CP - stable anatomy, med rx. d. cath 2015 and 05/30/2015 stable anatomy, consider Myoview if has CP again  . Cerebral ischemia    MRI November, 2010, chronic microvascular ischemia  . Chronic diastolic CHF (congestive heart failure) ([place]) 03/13/2016  . Depression    Bipolar  . Diabetes mellitus type II,  uncontrolled ([place])   . Diabetes mellitus without complication ([place])   . Diarrhea 02/15/2016  . Dizziness   . Edema   . Ejection fraction    EF 60%, echo, July 31, 2012  . Falling episodes    these have occurred in the past and again recurring 2011  . Family history of adverse reaction to anesthesia    "mother died during bypass surgery but not sure if it has to do with anesthesia"  . Gastric ulcer   . GERD (gastroesophageal reflux disease)   . H/O medication noncompliance    Due to loss of insurance  . Hard of hearing   . Headache 01/25/2016  . Hx of CABG    2000,  / one median sternotomy suture broken her chest x-ray November, 2010, no clinical significance  . Hyperlipidemia   . Hypertension    pt. denies  . Iron deficiency anemia   . Low back pain 06/12/2009   Qualifier: Diagnosis of  By: [name]   . Nausea with vomiting 01/25/2016  . Nephrolithiasis   . OSA (obstructive sleep apnea)   . Pain in limb 06/12/2009   Qualifier: Diagnosis of  By: [name]   . Palpitations    event recorder showed sinus rhythm  . RBBB (right bundle branch block)   . Restless leg   . Right knee pain 01/07/2015  . Shortness of breath    CPX April, 2011, mild functional limitation, no clear pulmonary or cardiac limitation, possible  deconditioning and mild chronotropic incompetence( peak heart rate 130)  . Spondylosis    C5-6, C6-7 MRI 2010  . Syncope 03/2016  . Thyroid disease   . Tubulovillous adenoma of colon 2007  . Wears glasses     MEDICATIONS: Current Outpatient Prescriptions on File Prior to Visit  Medication Sig Dispense Refill  . albuterol (PROVENTIL HFA;VENTOLIN HFA) 108 (90 Base) MCG/ACT inhaler Inhale 2 puffs into the lungs every 6 (six) hours as needed for wheezing or shortness of breath. 1 Inhaler 0  . amiodarone (PACERONE) 400 MG tablet Take 1 tablet (400 mg total) by mouth daily. 30 tablet 0  . atorvastatin (LIPITOR) 40 MG tablet TAKE 1 TABLET BY MOUTH DAILY AT 6PM 30 tablet 5  . clotrimazole-betamethasone (LOTRISONE) cream Apply 1 application topically 2 (two) times daily.    . cyclobenzaprine (FLEXERIL) 10 MG tablet Take 1 tablet (10 mg total) by mouth 3 (three) times daily as needed for muscle spasms. 30 tablet 0  . dextromethorphan (ROBITUSSIN 12 HOUR COUGH) 30 MG/5ML liquid Take 2.5 mLs (15 mg total) by mouth 2 (two) times daily as needed for cough. 89 mL 0  . diclofenac sodium (VOLTAREN) 1 % GEL Apply 2 g topically daily as needed (for pain in affected area).    . gabapentin (NEURONTIN) 300 MG capsule Take 2 capsules (600 mg) by mouth  every morning, 1 capsule (300 mg) by mouth  at noon, and 2 capsules (600 mg) by mouth at night    . glucose blood (ACCU-CHEK AVIVA) test strip Use as directed to check blood sugar 4 times daily.  DX E11.9 200 each 6  . HYDROcodone-acetaminophen (NORCO/VICODIN) 5-325 MG tablet Take 1-2 tablets by mouth every 4 (four) hours as needed (mild pain). 80 tablet 0  . HYDROcodone-homatropine (HYCODAN) 5-1.5 MG/5ML syrup Take 5 mLs by mouth every 8 (eight) hours as needed for cough. 120 mL 0  . insulin aspart (NOVOLOG) 100 UNIT/ML injection Inject 10-17 Units into  the skin 3 (three) times daily with meals. Use 10-17 unit 3 times daily before meals 15 mL 4  . Insulin Glargine  (TOUJEO SOLOSTAR) 300 UNIT/ML SOPN Inject 42 Units into the skin at bedtime. (Patient taking differently: Inject 48 Units into the skin at bedtime. ) 3 pen 4  . isosorbide mononitrate (IMDUR) 120 MG 24 hr tablet Take 120 mg by mouth daily.    . isosorbide mononitrate (IMDUR) 60 MG 24 hr tablet TAKE TWO TABLETS BY MOUTH DAILY 180 tablet 0  . levothyroxine (SYNTHROID, LEVOTHROID) 88 MCG tablet Take 1 tablet (88 mcg total) by mouth daily before breakfast. 30 tablet 3  . meclizine (ANTIVERT) 12.5 MG tablet Take 1 tablet (12.5 mg total) by mouth 3 (three) times daily as needed for dizziness. 10 tablet 0  . metFORMIN (GLUCOPHAGE) 1000 MG tablet TAKE 1 TABLET (1,000 MG TOTAL) BY MOUTH 2 (TWO) TIMES DAILY WITH A MEAL. 60 tablet 5  . metoprolol succinate (TOPROL-XL) 50 MG 24 hr tablet Take 1 and a half tablets once a day for a total of 75 mg. (Patient taking differently: Take 75 mg by mouth daily. ) 45 tablet 6  . midodrine (PROAMATINE) 10 MG tablet Take 1 tablet (10 mg total) by mouth 3 (three) times daily. 90 tablet 3  . nitroGLYCERIN (NITROSTAT) 0.4 MG SL tablet Place 0.4 mg under the tongue every 5 (five) minutes as needed for chest pain.    . pantoprazole (PROTONIX) 40 MG tablet TAKE 1 TABLET BY MOUTH TWICE DAILY FOR 2 WEEKS THEN DROP TO ONCE DAILY 45 tablet 5  . pramipexole (MIRAPEX) 1 MG tablet TAKE 1/2 TO 1 TABLET BY MOUTH AT BEDTIME 30 tablet 2  . sertraline (ZOLOFT) 100 MG tablet Take 1 tablet (100 mg total) by mouth 2 (two) times daily. 180 tablet 1  . tiZANidine (ZANAFLEX) 4 MG tablet Take 1 tablet (4 mg total) by mouth every 8 (eight) hours as needed for muscle spasms. 90 tablet 2  . topiramate (TOPAMAX) 50 MG tablet TAKE 1 TABLET (50 MG) BY MOUTH AT BEDTIME. 30 tablet 3  . warfarin (COUMADIN) 5 MG tablet Take as directed by [facility] (Patient taking differently: Take 5-7.5 mg by mouth See admin instructions. Take 1 tablet every day except on Friday and Saturday then take 1 and 1/2 tablets)  35 tablet 3   No current facility-administered medications on file prior to visit.     ALLERGIES: Allergies  Allergen Reactions  . Morphine Other (See Comments)    hallucinations  . Shellfish-Derived Products Itching    FAMILY HISTORY: Family History  Problem Relation Age of Onset  . Pancreatic cancer Brother   . Diabetes Brother     4   . Stroke Brother   . Diabetes Mother   . Heart failure Mother   . Heart failure Father   . Cancer Brother   . Coronary artery disease Brother   . Hypothyroidism Brother   . Heart attack      Nephew  . Irregular heart beat Daughter   . Cancer Maternal Grandmother     unknown   . Other Brother     colon surgery    SOCIAL HISTORY: Social History   Social History  . Marital status: Married    Spouse name: N/A  . Number of children: 4  . Years of education: 8   Occupational History  . retired    Social History Main Topics  . Smoking status: Never Smoker  .  Smokeless tobacco: Never Used  . Alcohol use No     Comment: stopped drinking in 1998  . Drug use: No  . Sexual activity: Yes    Partners: Female   Other Topics Concern  . Not on file   Social History Narrative   Patient is right handed.   Patient does not drink caffeine.    REVIEW OF SYSTEMS: Constitutional: No fevers, chills, or sweats, no generalized fatigue, change in appetite Eyes: No visual changes, double vision, eye pain Ear, nose and throat: No hearing loss, ear pain, nasal congestion, sore throat Cardiovascular: No chest pain, palpitations Respiratory:  No shortness of breath at rest or with exertion, wheezes GastrointestinaI: No nausea, vomiting, diarrhea, abdominal pain, fecal incontinence Genitourinary:  No dysuria, urinary retention or frequency Musculoskeletal:  No neck pain, back pain Integumentary: No rash, pruritus, skin lesions Neurological: as above Psychiatric: No depression, insomnia, anxiety Endocrine: No palpitations, fatigue,  diaphoresis, mood swings, change in appetite, change in weight, increased thirst Hematologic/Lymphatic:  No purpura, petechiae. Allergic/Immunologic: no itchy/runny eyes, nasal congestion, recent allergic reactions, rashes  PHYSICAL EXAM: Vitals:   01/21/17 0909  BP: 124/64  Pulse: 61   General: No acute distress.  Patient appears well-groomed.  normal body habitus. Head:  Normocephalic/atraumatic Eyes:  Fundi examined but not visualized Neck: supple, no paraspinal tenderness, full range of motion Heart:  Regular rate and rhythm Lungs:  Clear to auscultation bilaterally Back: No paraspinal tenderness Neurological Exam: alert and oriented to person, place, and time. Attention span and concentration mildly impaired (trouble with serial 7 subtraction) recent and remote memory intact, fund of knowledge intact.  Speech fluent and not dysarthric, language intact.  Visuospatial and executive functioning fairly intact:  Did not copy a cube correctly but was able to correctly complete the trail making test and draw a clock. MMSE - Mini Mental State Exam 01/21/2017 01/15/2016 02/18/2015  Orientation to time 4 5 5   Orientation to Place 5 4 5   Registration 3 3 3   Attention/ Calculation 0 0 2  Recall 3 2 3   Language- name 2 objects 2 2 2   Language- repeat 1 1 1   Language- follow 3 step command 3 3 3   Language- read & follow direction 1 1 1   Write a sentence 1 1 1   Copy design 1 1 1   Total score 24 23 27    CN II-XII intact. Bulk and tone normal, muscle strength 5/5 throughout.  No rigidity or bradykinesia  Sensation to light touch, temperature and vibration intact.  Deep tendon reflexes 2+ throughout.  Finger to nose and heel to shin testing intact.  Gait intact.  Able to turn in 2 steps, Romberg with sway.  IMPRESSION: 1.  Recurrent syncope/near-syncope.  He has atrial fibrillation but also dysautonomia, requiring midodrine for hypotension. 2.  Seizure 3.  Cervicogenic headache 4.   Confusion  This is complex history. Cognitive impairment may be a neurodegenerative disorder or possibly related to cerebral hypoperfusion from recurrent syncope.  He and his wife endorse shuffling gait, tremor, occasional double vision and freezing.  In conjunction with his dysautonomia, one might consider multiple system atrophy.  However, I don't appreciate any parkinsonism or gaze paresis on exam.  He has already had multiple imaging of the head, as well as multiple EEGs, so we will not repeat since it won't change management.  PLAN: 1.  We will initiate Depakote ER 500mg  daily to address headaches as well as seizure.  He will continue gabapentin 600mg /300mg /600mg . 2.  I will refer him for formal neuropsychological testing to try and sort out cognitive impairment which may be a neurodegenerative disorder versus possibly due to recurrent cerebral hypoperfusion. 3.  I informed him about [place] law and that he should not drive (because he has had unprovoked recurrent episodes of altered awareness or consciousness, as well as underlying cognitive impairment/confusion). 4.  Follow up in 3 months.  28 minutes spent face to face with patient, over 50% spent discussing possible diagnoses, therapy and discussed [place] law and that he should not drive.[name] Clines, DO  CC:  Penni Homans, MD

## 2017-01-21 NOTE — Assessment & Plan Note (Signed)
Well controlled, no changes to meds. Encouraged heart healthy diet such as the DASH diet and exercise as tolerated.  °

## 2017-01-22 LAB — HEMOGLOBIN A1C
HEMOGLOBIN A1C: 9.5 % — AB (ref <5.7)
MEAN PLASMA GLUCOSE: 226 mg/dL

## 2017-01-24 ENCOUNTER — Other Ambulatory Visit: Payer: Self-pay | Admitting: Family Medicine

## 2017-01-24 MED ORDER — ATORVASTATIN CALCIUM 40 MG PO TABS: 60.0000 mg | ORAL_TABLET | Freq: Every day | ORAL | 3 refills | Status: DC

## 2017-01-24 MED FILL — ATORVASTATIN 40 MG TABLET: 40 | 30 days supply | Qty: 45 | Fill #0

## 2017-01-24 NOTE — Assessment & Plan Note (Signed)
Was started on Depakote to manage his condition

## 2017-01-24 NOTE — Assessment & Plan Note (Signed)
Asymptomatic at this time 

## 2017-01-24 NOTE — Progress Notes (Signed)
Patient ID: [name] [name], male   DOB: 05/04/1946, 71 y.o.   MRN: [id number]   Subjective:    Patient ID: [name] [name], male    DOB: [birth year], 71 y.o.   MRN: [id number]  Chief Complaint  Patient presents with  . Follow-up    HPI Patient is in today for follow up. He is accompanied by his wife. They deny any further seizure activity and he has been started on Depakote by his neurologist. He was just there today and they are struggling to manage his headaches and memory concerns. His headaches are generalized and daily at this time. He also notes a general sense of weakness. Denies any fevers, chills but still endorses some cough and head congestion. No polyuria or polydipsia and he is not taking his insulin at this time. Denies CP/palp/SOBfevers/GI or GU c/o. Taking meds as prescribed  Past Medical History:  Diagnosis Date  . ACP (advance care planning) 05/11/2015  . Advance care planning 05/01/2015  . Anemia    hemoglobin 7.4, iron deficiency, January, 2011, 2 unit transfusion, endoscopy normal, capsule endoscopy February, 2011 no small bowel abnormalities.   Most likely source gastric erosions, followed by GI  . Anxiety   . CAD (coronary artery disease)    A. CABG in 2000,status post cardiac cath in 2006, 2009 ....continued chest pain and SOB despite oral medication adjestments including Ranexa. B. Cath November 2009/ mRCA - 2.75 x 23 Abbott Xience V drug-eluting stent ...11/26/2008 to distal  RCA leading to acute marginal.  C. Cath 07/2012 for CP - stable anatomy, med rx. d. cath 2015 and 05/30/2015 stable anatomy, consider Myoview if has CP again  . Cerebral ischemia    MRI November, 2010, chronic microvascular ischemia  . Chronic diastolic CHF (congestive heart failure) ([place]) 03/13/2016  . Depression    Bipolar  . Diabetes mellitus type II, uncontrolled ([place])   . Diabetes mellitus without complication ([place])   . Diarrhea 02/15/2016  . Dizziness   . Edema   . Ejection fraction    EF 60%, echo, July 31, 2012  . Falling episodes    these have occurred in the past and again recurring 2011  . Family history of adverse reaction to anesthesia    "mother died during bypass surgery but not sure if it has to do with anesthesia"  . Gastric ulcer   . GERD (gastroesophageal reflux disease)   . H/O medication noncompliance    Due to loss of insurance  . Hard of hearing   . Headache 01/25/2016  . Hx of CABG    2000,  / one median sternotomy suture broken her chest x-ray November, 2010, no clinical significance  . Hyperlipidemia   . Hypertension    pt. denies  . Iron deficiency anemia   . Low back pain 06/12/2009   Qualifier: Diagnosis of  By: [name]   . Nausea with vomiting 01/25/2016  . Nephrolithiasis   . OSA (obstructive sleep apnea)   . Pain in limb 06/12/2009   Qualifier: Diagnosis of  By: [name]   . Palpitations    event recorder showed sinus rhythm  . RBBB (right bundle branch block)   . Restless leg   . Right knee pain 01/07/2015  . Shortness of breath    CPX April, 2011, mild functional limitation, no clear pulmonary or cardiac limitation, possible deconditioning and mild chronotropic incompetence( peak heart rate 130)  . Spondylosis    C5-6, C6-7  MRI 2010  . Syncope 03/2016  . Thyroid disease   . Tubulovillous adenoma of colon 2007  . Wears glasses     Past Surgical History:  Procedure Laterality Date  . ANTERIOR CERVICAL DECOMP/DISCECTOMY FUSION N/A 05/31/2016   Procedure: ANTERIOR CERVICAL DECOMPRESSION/DISCECTOMY FUSION CERVICAL FIVE-SIX,CERVICAL SIX-SEVEN;  Surgeon: [name], MD;  Location: [facility];  Service: Neurosurgery;  Laterality: N/A;  . CARDIAC CATHETERIZATION N/A 05/30/2015   Procedure: Left Heart Cath and Coronary Angiography;  Surgeon: [name], MD;  Location: [facility];  Service: Cardiovascular;  Laterality: N/A;  . COLONOSCOPY    . CORONARY ARTERY BYPASS GRAFT     2000  .  ESOPHAGOGASTRODUODENOSCOPY    . LEFT HEART CATHETERIZATION WITH CORONARY/GRAFT ANGIOGRAM N/A 08/01/2012   Procedure: LEFT HEART CATHETERIZATION WITH Beatrix Fetters;  Surgeon: [name], MD;  Location: [facility] CATH LAB;  Service: Cardiovascular;  Laterality: N/A;  . LEFT HEART CATHETERIZATION WITH CORONARY/GRAFT ANGIOGRAM N/A 01/03/2015   Procedure: LEFT HEART CATHETERIZATION WITH Beatrix Fetters;  Surgeon: [name], MD;  Location: [facility] CATH LAB;  Service: Cardiovascular;  Laterality: N/A;  . NASAL SEPTUM SURGERY     UP3  . PERCUTANEOUS CORONARY STENT INTERVENTION (PCI-S)  10/2008   mRCA PCI  2.75 x 23 Abbott Xience V drug-eluting stent     Family History  Problem Relation Age of Onset  . Pancreatic cancer Brother   . Diabetes Brother     4   . Stroke Brother   . Diabetes Mother   . Heart failure Mother   . Heart failure Father   . Cancer Brother   . Coronary artery disease Brother   . Hypothyroidism Brother   . Heart attack      Nephew  . Irregular heart beat Daughter   . Cancer Maternal Grandmother     unknown   . Other Brother     colon surgery    Social History   Social History  . Marital status: Married    Spouse name: N/A  . Number of children: 4  . Years of education: 22   Occupational History  . retired    Social History Main Topics  . Smoking status: Never Smoker  . Smokeless tobacco: Never Used  . Alcohol use No     Comment: stopped drinking in 1998  . Drug use: No  . Sexual activity: Yes    Partners: Female   Other Topics Concern  . Not on file   Social History Narrative   Patient is right handed.   Patient does not drink caffeine.    Outpatient Medications Prior to Visit  Medication Sig Dispense Refill  . albuterol (PROVENTIL HFA;VENTOLIN HFA) 108 (90 Base) MCG/ACT inhaler Inhale 2 puffs into the lungs every 6 (six) hours as needed for wheezing or shortness of breath. 1 Inhaler 0  . amiodarone (PACERONE) 400 MG tablet  Take 1 tablet (400 mg total) by mouth daily. 30 tablet 0  . diclofenac sodium (VOLTAREN) 1 % GEL Apply 2 g topically daily as needed (for pain in affected area).    . divalproex (DEPAKOTE ER) 500 MG 24 hr tablet Take 1 tablet (500 mg total) by mouth daily. 30 tablet 2  . gabapentin (NEURONTIN) 300 MG capsule Take 2 capsules (600 mg) by mouth  every morning, 1 capsule (300 mg) by mouth  at noon, and 2 capsules (600 mg) by mouth at night    . glucose blood (ACCU-CHEK AVIVA) test strip  Use as directed to check blood sugar 4 times daily.  DX E11.9 200 each 6  . insulin aspart (NOVOLOG) 100 UNIT/ML injection Inject 10-17 Units into the skin 3 (three) times daily with meals. Use 10-17 unit 3 times daily before meals 15 mL 4  . Insulin Glargine (TOUJEO SOLOSTAR) 300 UNIT/ML SOPN Inject 42 Units into the skin at bedtime. (Patient taking differently: Inject 48 Units into the skin at bedtime. ) 3 pen 4  . isosorbide mononitrate (IMDUR) 60 MG 24 hr tablet TAKE TWO TABLETS BY MOUTH DAILY 180 tablet 0  . levothyroxine (SYNTHROID, LEVOTHROID) 88 MCG tablet Take 1 tablet (88 mcg total) by mouth daily before breakfast. 30 tablet 3  . meclizine (ANTIVERT) 12.5 MG tablet Take 1 tablet (12.5 mg total) by mouth 3 (three) times daily as needed for dizziness. 10 tablet 0  . metFORMIN (GLUCOPHAGE) 1000 MG tablet TAKE 1 TABLET (1,000 MG TOTAL) BY MOUTH 2 (TWO) TIMES DAILY WITH A MEAL. 60 tablet 5  . metoprolol succinate (TOPROL-XL) 50 MG 24 hr tablet Take 1 and a half tablets once a day for a total of 75 mg. (Patient taking differently: Take 75 mg by mouth daily. ) 45 tablet 6  . midodrine (PROAMATINE) 10 MG tablet Take 1 tablet (10 mg total) by mouth 3 (three) times daily. 90 tablet 3  . nitroGLYCERIN (NITROSTAT) 0.4 MG SL tablet Place 0.4 mg under the tongue every 5 (five) minutes as needed for chest pain.    . pantoprazole (PROTONIX) 40 MG tablet TAKE 1 TABLET BY MOUTH TWICE DAILY FOR 2 WEEKS THEN DROP TO ONCE DAILY 45  tablet 5  . pramipexole (MIRAPEX) 1 MG tablet TAKE 1/2 TO 1 TABLET BY MOUTH AT BEDTIME 30 tablet 2  . sertraline (ZOLOFT) 100 MG tablet Take 1 tablet (100 mg total) by mouth 2 (two) times daily. 180 tablet 1  . tiZANidine (ZANAFLEX) 4 MG tablet Take 1 tablet (4 mg total) by mouth every 8 (eight) hours as needed for muscle spasms. 90 tablet 2  . topiramate (TOPAMAX) 50 MG tablet TAKE 1 TABLET (50 MG) BY MOUTH AT BEDTIME. 30 tablet 3  . warfarin (COUMADIN) 5 MG tablet Take as directed by [facility] (Patient taking differently: Take 5-7.5 mg by mouth See admin instructions. Take 1 tablet every day except on Friday and Saturday then take 1 and 1/2 tablets) 35 tablet 3  . atorvastatin (LIPITOR) 40 MG tablet TAKE 1 TABLET BY MOUTH DAILY AT 6PM (Patient taking differently: TAKE 1 and 1/2 tablet by mouth every day.) 30 tablet 5  . clotrimazole-betamethasone (LOTRISONE) cream Apply 1 application topically 2 (two) times daily.    . cyclobenzaprine (FLEXERIL) 10 MG tablet Take 1 tablet (10 mg total) by mouth 3 (three) times daily as needed for muscle spasms. 30 tablet 0  . dextromethorphan (ROBITUSSIN 12 HOUR COUGH) 30 MG/5ML liquid Take 2.5 mLs (15 mg total) by mouth 2 (two) times daily as needed for cough. 89 mL 0  . HYDROcodone-acetaminophen (NORCO/VICODIN) 5-325 MG tablet Take 1-2 tablets by mouth every 4 (four) hours as needed (mild pain). 80 tablet 0  . HYDROcodone-homatropine (HYCODAN) 5-1.5 MG/5ML syrup Take 5 mLs by mouth every 8 (eight) hours as needed for cough. 120 mL 0  . isosorbide mononitrate (IMDUR) 120 MG 24 hr tablet Take 120 mg by mouth daily.     No facility-administered medications prior to visit.     Allergies  Allergen Reactions  . Morphine Other (See Comments)  hallucinations  . Shellfish-Derived Products Itching    Review of Systems  Constitutional: Positive for malaise/fatigue. Negative for fever.  HENT: Positive for congestion.   Eyes: Negative for blurred vision.    Respiratory: Positive for shortness of breath.   Cardiovascular: Negative for chest pain, palpitations and leg swelling.  Gastrointestinal: Negative for abdominal pain, blood in stool and nausea.  Genitourinary: Negative for dysuria and frequency.  Musculoskeletal: Negative for falls.  Skin: Negative for rash.  Neurological: Positive for weakness and headaches. Negative for dizziness and loss of consciousness.  Endo/Heme/Allergies: Negative for environmental allergies.  Psychiatric/Behavioral: Positive for depression and memory loss. The patient is nervous/anxious.        Objective:    Physical Exam  Constitutional: He is oriented to person, place, and time. He appears well-developed and well-nourished. No distress.  HENT:  Head: Normocephalic and atraumatic.  Nose: Nose normal.  Eyes: Right eye exhibits no discharge. Left eye exhibits no discharge.  Neck: Normal range of motion. Neck supple.  Cardiovascular: Normal rate.   No murmur heard. Pulmonary/Chest: Effort normal and breath sounds normal.  Abdominal: Soft. Bowel sounds are normal. There is no tenderness.  Musculoskeletal: He exhibits no edema.  Neurological: He is alert and oriented to person, place, and time.  Skin: Skin is warm and dry.  Psychiatric: He has a normal mood and affect.  Nursing note and vitals reviewed.   BP 110/68 (BP Location: Left Arm, Patient Position: Sitting, Cuff Size: Normal)   Pulse (!) 58   Temp 98 F (36.7 C) (Oral)   Wt 165 lb 12.8 oz (75.2 kg)   SpO2 97%   BMI 24.48 kg/m  Wt Readings from Last 3 Encounters:  01/21/17 165 lb 12.8 oz (75.2 kg)  01/21/17 166 lb 6.4 oz (75.5 kg)  01/04/17 161 lb 6 oz (73.2 kg)     Lab Results  Component Value Date   WBC 5.9 01/21/2017   HGB 12.3 (L) 01/21/2017   HCT 37.6 (L) 01/21/2017   PLT 173 01/21/2017   GLUCOSE 206 (H) 01/21/2017   CHOL 152 01/21/2017   TRIG 280 (H) 01/21/2017   HDL 37 (L) 01/21/2017   LDLDIRECT 25.0 02/09/2016    LDLCALC 59 01/21/2017   ALT 10 01/21/2017   AST 14 01/21/2017   NA 138 01/21/2017   K 4.3 01/21/2017   CL 101 01/21/2017   CREATININE 1.06 01/21/2017   BUN 14 01/21/2017   CO2 26 01/21/2017   TSH 3.97 01/21/2017   PSA 2.39 11/25/2010   INR 2.3 01/14/2017   HGBA1C 9.5 (H) 01/21/2017   MICROALBUR 1.1 05/01/2015    Lab Results  Component Value Date   TSH 3.97 01/21/2017   Lab Results  Component Value Date   WBC 5.9 01/21/2017   HGB 12.3 (L) 01/21/2017   HCT 37.6 (L) 01/21/2017   MCV 88.7 01/21/2017   PLT 173 01/21/2017   Lab Results  Component Value Date   NA 138 01/21/2017   K 4.3 01/21/2017   CO2 26 01/21/2017   GLUCOSE 206 (H) 01/21/2017   BUN 14 01/21/2017   CREATININE 1.06 01/21/2017   BILITOT 0.4 01/21/2017   ALKPHOS 68 01/21/2017   AST 14 01/21/2017   ALT 10 01/21/2017   PROT 7.1 01/21/2017   ALBUMIN 4.3 01/21/2017   CALCIUM 9.3 01/21/2017   ANIONGAP 8 01/02/2017   GFR 69.23 01/04/2017   Lab Results  Component Value Date   CHOL 152 01/21/2017   Lab Results  Component  Value Date   HDL 37 (L) 01/21/2017   Lab Results  Component Value Date   LDLCALC 59 01/21/2017   Lab Results  Component Value Date   TRIG 280 (H) 01/21/2017   Lab Results  Component Value Date   CHOLHDL 4.1 01/21/2017   Lab Results  Component Value Date   HGBA1C 9.5 (H) 01/21/2017       Assessment & Plan:   Problem List Items Addressed This Visit    Essential hypertension (Chronic)    Well controlled, no changes to meds. Encouraged heart healthy diet such as the DASH diet and exercise as tolerated.       Relevant Orders   CBC (Completed)   Comprehensive metabolic panel (Completed)   TSH (Completed)   Diabetes mellitus type II, uncontrolled (HCC) (Chronic)    hgba1c acceptable, minimize simple carbs. Increase exercise as tolerated. Continue current meds      Relevant Orders   Hemoglobin A1c (Completed)   Hypothyroidism    On Levothyroxine, continue to monitor       Relevant Orders   TSH (Completed)   Headache    Was seen by neurology this am and they are continuing to manage. He reports his headache is somewhat improved today      Chronic diastolic CHF (congestive heart failure) ([place])    Asymptomatic at this time.      Atrial fibrillation ([place]) [I48.91]    Tolerating coumadin and rate controlled.       Seizure disorder ([place])    Was started on Depakote to manage his condition       Other Visit Diagnoses    Hyperlipidemia, mixed    -  Primary   Relevant Orders   Lipid panel (Completed)      I have discontinued Mr. Oquinn clotrimazole-betamethasone, cyclobenzaprine, HYDROcodone-acetaminophen, dextromethorphan, and HYDROcodone-homatropine. I am also having him maintain his tiZANidine, glucose blood, gabapentin, insulin aspart, Insulin Glargine, diclofenac sodium, nitroGLYCERIN, metFORMIN, pantoprazole, topiramate, sertraline, metoprolol succinate, amiodarone, midodrine, meclizine, levothyroxine, warfarin, isosorbide mononitrate, albuterol, pramipexole, and divalproex.  No orders of the defined types were placed in this encounter.   CMA served as Education administrator during this visit. History, Physical and Plan performed by medical provider. Documentation and orders reviewed and attested to.  Penni Homans, MD

## 2017-01-24 NOTE — Assessment & Plan Note (Signed)
Was seen by neurology this am and they are continuing to manage. He reports his headache is somewhat improved today

## 2017-01-24 NOTE — Assessment & Plan Note (Signed)
Tolerating coumadin and rate controlled.

## 2017-01-25 ENCOUNTER — Telehealth: Payer: Self-pay | Admitting: *Deleted

## 2017-01-25 NOTE — Telephone Encounter (Signed)
Rescheduled AWV for 03/17/17 @330  with Hoyle Sauer

## 2017-02-02 DIAGNOSIS — G4733 Obstructive sleep apnea (adult) (pediatric): Secondary | ICD-10-CM | POA: Diagnosis not present

## 2017-02-04 ENCOUNTER — Telehealth: Payer: Self-pay

## 2017-02-04 NOTE — Telephone Encounter (Signed)
PA initiated via Covermymeds; KEY: [id number]. Awaiting determination.

## 2017-02-07 NOTE — Telephone Encounter (Signed)
Electronic PA approval received. Approved through 02/05/2018. PA case number: [id number].

## 2017-02-10 ENCOUNTER — Ambulatory Visit: Payer: Commercial Managed Care - HMO | Admitting: Family Medicine

## 2017-02-10 ENCOUNTER — Other Ambulatory Visit: Payer: Self-pay

## 2017-02-10 NOTE — Telephone Encounter (Signed)
Received hardcopy approval for PA. Sent to scan.

## 2017-02-11 ENCOUNTER — Ambulatory Visit (INDEPENDENT_AMBULATORY_CARE_PROVIDER_SITE_OTHER): Payer: Medicare PPO | Admitting: Pharmacist

## 2017-02-11 ENCOUNTER — Other Ambulatory Visit: Payer: Self-pay | Admitting: Neurology

## 2017-02-11 ENCOUNTER — Other Ambulatory Visit: Payer: Self-pay | Admitting: Family Medicine

## 2017-02-11 DIAGNOSIS — Z5181 Encounter for therapeutic drug level monitoring: Secondary | ICD-10-CM

## 2017-02-11 DIAGNOSIS — I480 Paroxysmal atrial fibrillation: Secondary | ICD-10-CM | POA: Diagnosis not present

## 2017-02-11 LAB — POCT INR: INR: 2.4

## 2017-02-11 MED FILL — PRAMIPEXOLE 1 MG TABLET: 1 | 30 days supply | Qty: 30 | Fill #1

## 2017-02-11 MED FILL — SERTRALINE HCL 100 MG TAB: 100 | 90 days supply | Qty: 180 | Fill #1

## 2017-02-11 MED FILL — metFORMIN HCL 1000 MG TABS: 1000 | 30 days supply | Qty: 60 | Fill #0

## 2017-02-11 MED FILL — WARFARIN SODIUM 5 MG TABLET: 5 | 30 days supply | Qty: 35 | Fill #3

## 2017-02-11 MED FILL — METOPROLOL SUCC ER 50 MG TA: 50 | 30 days supply | Qty: 45 | Fill #3

## 2017-02-11 NOTE — Telephone Encounter (Signed)
Not mentioned in last office note. Please advise on refill.

## 2017-02-11 NOTE — Telephone Encounter (Signed)
He should not be taking this.  He should have been off of this for a long time.

## 2017-02-13 ENCOUNTER — Emergency Department (HOSPITAL_COMMUNITY)
Admission: EM | Admit: 2017-02-13 | Discharge: 2017-02-13 | Disposition: A | Discharge status: Home / Self Care | Payer: Medicare PPO | Attending: Emergency Medicine | Admitting: Emergency Medicine

## 2017-02-13 ENCOUNTER — Encounter (HOSPITAL_COMMUNITY): Payer: Self-pay | Admitting: Emergency Medicine

## 2017-02-13 DIAGNOSIS — Z7901 Long term (current) use of anticoagulants: Secondary | ICD-10-CM | POA: Diagnosis not present

## 2017-02-13 DIAGNOSIS — Z951 Presence of aortocoronary bypass graft: Secondary | ICD-10-CM | POA: Insufficient documentation

## 2017-02-13 DIAGNOSIS — I5032 Chronic diastolic (congestive) heart failure: Secondary | ICD-10-CM | POA: Diagnosis not present

## 2017-02-13 DIAGNOSIS — K529 Noninfective gastroenteritis and colitis, unspecified: Secondary | ICD-10-CM | POA: Insufficient documentation

## 2017-02-13 DIAGNOSIS — E119 Type 2 diabetes mellitus without complications: Secondary | ICD-10-CM | POA: Insufficient documentation

## 2017-02-13 DIAGNOSIS — I2510 Atherosclerotic heart disease of native coronary artery without angina pectoris: Secondary | ICD-10-CM | POA: Diagnosis not present

## 2017-02-13 DIAGNOSIS — Z79899 Other long term (current) drug therapy: Secondary | ICD-10-CM | POA: Diagnosis not present

## 2017-02-13 DIAGNOSIS — R112 Nausea with vomiting, unspecified: Secondary | ICD-10-CM | POA: Diagnosis present

## 2017-02-13 DIAGNOSIS — Z794 Long term (current) use of insulin: Secondary | ICD-10-CM | POA: Insufficient documentation

## 2017-02-13 DIAGNOSIS — I110 Hypertensive heart disease with heart failure: Secondary | ICD-10-CM | POA: Insufficient documentation

## 2017-02-13 DIAGNOSIS — R42 Dizziness and giddiness: Secondary | ICD-10-CM | POA: Diagnosis not present

## 2017-02-13 DIAGNOSIS — R404 Transient alteration of awareness: Secondary | ICD-10-CM | POA: Diagnosis not present

## 2017-02-13 LAB — CBC WITH DIFFERENTIAL/PLATELET
BASOS PCT: 0 %
Basophils Absolute: 0 10*3/uL (ref 0.0–0.1)
Eosinophils Absolute: 0 10*3/uL (ref 0.0–0.7)
Eosinophils Relative: 1 %
HEMATOCRIT: 36.7 % — AB (ref 39.0–52.0)
HEMOGLOBIN: 12.1 g/dL — AB (ref 13.0–17.0)
LYMPHS ABS: 0.8 10*3/uL (ref 0.7–4.0)
LYMPHS PCT: 14 %
MCH: 28.6 pg (ref 26.0–34.0)
MCHC: 33 g/dL (ref 30.0–36.0)
MCV: 86.8 fL (ref 78.0–100.0)
MONOS PCT: 7 %
Monocytes Absolute: 0.4 10*3/uL (ref 0.1–1.0)
NEUTROS PCT: 78 %
Neutro Abs: 4.5 10*3/uL (ref 1.7–7.7)
Platelets: 125 10*3/uL — ABNORMAL LOW (ref 150–400)
RBC: 4.23 MIL/uL (ref 4.22–5.81)
RDW: 14.9 % (ref 11.5–15.5)
WBC: 5.7 10*3/uL (ref 4.0–10.5)

## 2017-02-13 LAB — BASIC METABOLIC PANEL
Anion gap: 11 (ref 5–15)
BUN: 13 mg/dL (ref 6–20)
CHLORIDE: 104 mmol/L (ref 101–111)
CO2: 22 mmol/L (ref 22–32)
CREATININE: 0.99 mg/dL (ref 0.61–1.24)
Calcium: 8.2 mg/dL — ABNORMAL LOW (ref 8.9–10.3)
GFR calc non Af Amer: >60 mL/min (ref >60)
GLUCOSE: 298 mg/dL — AB (ref 65–99)
Potassium: 3.6 mmol/L (ref 3.5–5.1)
Sodium: 137 mmol/L (ref 135–145)

## 2017-02-13 LAB — VALPROIC ACID LEVEL: Valproic Acid Lvl: <10 ug/mL — ABNORMAL LOW (ref 50.0–100.0)

## 2017-02-13 MED ORDER — ONDANSETRON HCL 4 MG/2ML IJ SOLN
4.0000 mg | Freq: Once | INTRAMUSCULAR | Status: AC
  Administered 2017-02-13: 4 mg via INTRAVENOUS
  Filled 2017-02-13: qty 2

## 2017-02-13 MED ORDER — ONDANSETRON 4 MG PO TBDP: 4.0000 mg | ORAL_TABLET | Freq: Three times a day (TID) | ORAL | 1 refills | Status: DC | PRN

## 2017-02-13 MED ORDER — HYDROMORPHONE HCL 2 MG/ML IJ SOLN
1.0000 mg | Freq: Once | INTRAMUSCULAR | Status: AC
Start: 2017-02-13 — End: 2017-02-13
  Administered 2017-02-13: 1 mg via INTRAVENOUS
  Filled 2017-02-13: qty 1

## 2017-02-13 MED ORDER — LOPERAMIDE HCL 2 MG PO TABS: 2.0000 mg | ORAL_TABLET | Freq: Four times a day (QID) | ORAL | 0 refills | Status: DC | PRN

## 2017-02-13 MED ORDER — SODIUM CHLORIDE 0.9 % IV BOLUS (SEPSIS)
500.0000 mL | Freq: Once | INTRAVENOUS | Status: AC
  Administered 2017-02-13: 500 mL via INTRAVENOUS

## 2017-02-13 MED ORDER — SODIUM CHLORIDE 0.9 % IV SOLN
INTRAVENOUS | Status: DC
  Administered 2017-02-13: 14:00:00 via INTRAVENOUS

## 2017-02-13 NOTE — Discharge Instructions (Signed)
Take the Zofran as needed for the nausea and vomiting. Take the Imodium as needed for the diarrhea. Return if not improving in one to 2 days. Return for any new or worse symptoms.

## 2017-02-13 NOTE — ED Triage Notes (Signed)
Per EMS: pt with HA starting yesterday that is now resolved but having some dizziness; pt has not taken DM meds and CBG 393; 18g R FA

## 2017-02-13 NOTE — ED Provider Notes (Signed)
[facility] DEPT Provider Note   CSN: [id number] Arrival date & time: 02/13/17  1133     History   Chief Complaint Chief Complaint  Patient presents with  . Dizziness    HPI [name] [name] is a 71 y.o. male.  The patient has a history of long-standing headache problems. And some mild focal neuro deficits is being followed by neurology no definitive answers to date. Patient last evening after eating at the cracker barrel had acute onset of nausea vomiting and diarrhea without any blood. Said several episodes of vomiting and diarrhea since then. Patient's wife is not currently ill. No significant abdominal pain. No syncope. Patient is on Coumadin and patient is on Depakote.      Past Medical History:  Diagnosis Date  . ACP (advance care planning) 05/11/2015  . Advance care planning 05/01/2015  . Anemia    hemoglobin 7.4, iron deficiency, January, 2011, 2 unit transfusion, endoscopy normal, capsule endoscopy February, 2011 no small bowel abnormalities.   Most likely source gastric erosions, followed by GI  . Anxiety   . CAD (coronary artery disease)    A. CABG in 2000,status post cardiac cath in 2006, 2009 ....continued chest pain and SOB despite oral medication adjestments including Ranexa. B. Cath November 2009/ mRCA - 2.75 x 23 Abbott Xience V drug-eluting stent ...11/26/2008 to distal  RCA leading to acute marginal.  C. Cath 07/2012 for CP - stable anatomy, med rx. d. cath 2015 and 05/30/2015 stable anatomy, consider Myoview if has CP again  . Cerebral ischemia    MRI November, 2010, chronic microvascular ischemia  . Chronic diastolic CHF (congestive heart failure) ([place]) 03/13/2016  . Depression    Bipolar  . Diabetes mellitus type II, uncontrolled ([place])   . Diabetes mellitus without complication ([place])   . Diarrhea 02/15/2016  . Dizziness   . Edema   . Ejection fraction    EF 60%, echo, July 31, 2012  . Falling episodes    these have occurred in the past and again  recurring 2011  . Family history of adverse reaction to anesthesia    "mother died during bypass surgery but not sure if it has to do with anesthesia"  . Gastric ulcer   . GERD (gastroesophageal reflux disease)   . H/O medication noncompliance    Due to loss of insurance  . Hard of hearing   . Headache 01/25/2016  . Hx of CABG    2000,  / one median sternotomy suture broken her chest x-ray November, 2010, no clinical significance  . Hyperlipidemia   . Hypertension    pt. denies  . Iron deficiency anemia   . Low back pain 06/12/2009   Qualifier: Diagnosis of  By: [name]   . Nausea with vomiting 01/25/2016  . Nephrolithiasis   . OSA (obstructive sleep apnea)   . Pain in limb 06/12/2009   Qualifier: Diagnosis of  By: [name]   . Palpitations    event recorder showed sinus rhythm  . RBBB (right bundle branch block)   . Restless leg   . Right knee pain 01/07/2015  . Shortness of breath    CPX April, 2011, mild functional limitation, no clear pulmonary or cardiac limitation, possible deconditioning and mild chronotropic incompetence( peak heart rate 130)  . Spondylosis    C5-6, C6-7 MRI 2010  . Syncope 03/2016  . Thyroid disease   . Tubulovillous adenoma of colon 2007  . Wears glasses  Patient Active Problem List   Diagnosis Date Noted  . Seizure disorder ([place]) 01/09/2017  . Elevated INR 01/09/2017  . Viral respiratory illness 12/30/2016  . Atrial fibrillation ([place]) [I48.91] 07/20/2016  . Long term (current) use of anticoagulants [Z79.01] 07/20/2016  . Foraminal stenosis of cervical region 05/31/2016  . Exertional dyspnea 05/14/2016  . Chronic diastolic CHF (congestive heart failure) ([place]) 03/13/2016  . Confusion 03/10/2016  . Diarrhea 02/15/2016  . Headache 01/25/2016  . Carotid artery disease ([place]) 08/01/2015  . Spinal stenosis in cervical region 07/30/2015  . Occipital neuralgia of right side 07/15/2015  . Cervicogenic headache 07/15/2015  . Dizziness    . Chest pain 05/29/2015  . Medicare annual wellness visit, initial 05/11/2015  . Sun-damaged skin 05/11/2015  . Advance care planning 05/01/2015  . Concussion without loss of consciousness 02/18/2015  . Post-traumatic headache 02/18/2015  . Right knee pain 01/07/2015  . Personal history of ongoing treatment with alendronate (Fosamax) 12/20/2013  . HLD (hyperlipidemia) 06/27/2013  . Arthritis of hand, degenerative 06/05/2013  . Acquired atrophy of thyroid 06/05/2013  . Gastro-esophageal reflux disease without esophagitis 06/05/2013  . Clinical depression 06/05/2013  . Arteriosclerosis of coronary artery 05/01/2013  . Benign prostatic hyperplasia with urinary obstruction 05/01/2013  . Abdominal pain, unspecified site 03/06/2013  . Right shoulder pain 10/24/2012  . Eczema 10/24/2012  . Bilateral groin pain 09/21/2012  . Ejection fraction   . Diabetes mellitus type II, uncontrolled ([place]) 08/31/2011  . Coronary artery disease involving native coronary artery   . Anemia   . OSA (obstructive sleep apnea)   . Edema   . Essential hypertension   . Falling episodes   . Hx of CABG   . Palpitations   . Shortness of breath   . Insomnia 04/26/2011  . FATIGUE 12/10/2010  . Hypothyroidism 06/26/2010  . BENIGN POSITIONAL VERTIGO 09/19/2009  . Depression 07/09/2009  . RBBB 07/09/2009  . Low back pain 06/12/2009  . SCIATICA 01/17/2009  . TUBULOVILLOUS ADENOMA, COLON, HX OF 01/08/2009  . Presence of drug coated stent in right coronary artery 10/29/2008  . NEPHROLITHIASIS, HX OF 12/20/2007    Past Surgical History:  Procedure Laterality Date  . ANTERIOR CERVICAL DECOMP/DISCECTOMY FUSION N/A 05/31/2016   Procedure: ANTERIOR CERVICAL DECOMPRESSION/DISCECTOMY FUSION CERVICAL FIVE-SIX,CERVICAL SIX-SEVEN;  Surgeon: [name], MD;  Location: [facility];  Service: Neurosurgery;  Laterality: N/A;  . CARDIAC CATHETERIZATION N/A 05/30/2015   Procedure: Left Heart Cath and Coronary Angiography;   Surgeon: [name], MD;  Location: [facility];  Service: Cardiovascular;  Laterality: N/A;  . COLONOSCOPY    . CORONARY ARTERY BYPASS GRAFT     2000  . ESOPHAGOGASTRODUODENOSCOPY    . LEFT HEART CATHETERIZATION WITH CORONARY/GRAFT ANGIOGRAM N/A 08/01/2012   Procedure: LEFT HEART CATHETERIZATION WITH Beatrix Fetters;  Surgeon: [name], MD;  Location: [facility] CATH [facility];  Service: Cardiovascular;  Laterality: N/A;  . LEFT HEART CATHETERIZATION WITH CORONARY/GRAFT ANGIOGRAM N/A 01/03/2015   Procedure: LEFT HEART CATHETERIZATION WITH Beatrix Fetters;  Surgeon: [name], MD;  Location: [facility] CATH LAB;  Service: Cardiovascular;  Laterality: N/A;  . NASAL SEPTUM SURGERY     UP3  . PERCUTANEOUS CORONARY STENT INTERVENTION (PCI-S)  10/2008   mRCA PCI  2.75 x 23 Abbott Xience V drug-eluting stent        Home Medications    Prior to Admission medications   Medication Sig Start Date End Date Taking? Authorizing Provider  albuterol (PROVENTIL HFA;VENTOLIN HFA) 108 (90  Base) MCG/ACT inhaler Inhale 2 puffs into the lungs every 6 (six) hours as needed for wheezing or shortness of breath. 01/04/17  Yes [name], MD  amiodarone (PACERONE) 400 MG tablet Take 1 tablet (400 mg total) by mouth daily. 08/23/16  Yes Brittainy Erie Noe, PA-C  atorvastatin (LIPITOR) 40 MG tablet Take 1.5 tablets (60 mg total) by mouth daily. 01/24/17  Yes [name], MD  diclofenac sodium (VOLTAREN) 1 % GEL Apply 2 g topically daily as needed (for pain in affected area).   Yes Historical Provider, MD  divalproex (DEPAKOTE ER) 500 MG 24 hr tablet Take 1 tablet (500 mg total) by mouth daily. 01/21/17  Yes Adam Telford Nab, DO  gabapentin (NEURONTIN) 300 MG capsule Take 2 capsules (600 mg) by mouth  every morning, 1 capsule (300 mg) by mouth  at noon, and 2 capsules (600 mg) by mouth at night   Yes Historical Provider, MD  glucose blood (ACCU-CHEK AVIVA) test strip Use as directed to check  blood sugar 4 times daily.  DX E11.9 02/09/16  Yes [name], MD  insulin aspart (NOVOLOG) 100 UNIT/ML injection Inject 10-17 Units into the skin 3 (three) times daily with meals. Use 10-17 unit 3 times daily before meals 04/26/16  Yes [name], MD  Insulin Glargine (TOUJEO SOLOSTAR) 300 UNIT/ML SOPN Inject 42 Units into the skin at bedtime. Patient taking differently: Inject 48 Units into the skin at bedtime.  04/26/16  Yes [name], MD  isosorbide mononitrate (IMDUR) 60 MG 24 hr tablet TAKE TWO TABLETS BY MOUTH DAILY 12/28/16  Yes [name], MD  levothyroxine (SYNTHROID, LEVOTHROID) 88 MCG tablet Take 1 tablet (88 mcg total) by mouth daily before breakfast. 10/01/16  Yes [name], MD  meclizine (ANTIVERT) 12.5 MG tablet Take 1 tablet (12.5 mg total) by mouth 3 (three) times daily as needed for dizziness. 09/24/16  Yes Merrily Pew, MD  metFORMIN (GLUCOPHAGE) 1000 MG tablet TAKE 1 TABLET (1,000 MG TOTAL) BY MOUTH 2 (TWO) TIMES DAILY WITH A MEAL. 02/11/17  Yes [name], MD  metoprolol succinate (TOPROL-XL) 50 MG 24 hr tablet Take 1 and a half tablets once a day for a total of 75 mg. Patient taking differently: Take 75 mg by mouth daily.  08/04/16  Yes Brittainy Erie Noe, PA-C  midodrine (PROAMATINE) 10 MG tablet Take 1 tablet (10 mg total) by mouth 3 (three) times daily. 09/06/16  Yes [name], MD  pantoprazole (PROTONIX) 40 MG tablet TAKE 1 TABLET BY MOUTH TWICE DAILY FOR 2 WEEKS THEN DROP TO ONCE DAILY 08/02/16  Yes [name], MD  pramipexole (MIRAPEX) 1 MG tablet TAKE 1/2 TO 1 TABLET BY MOUTH AT BEDTIME 01/13/17  Yes [name], MD  sertraline (ZOLOFT) 100 MG tablet Take 1 tablet (100 mg total) by mouth 2 (two) times daily. 08/03/16  Yes [name], MD  tiZANidine (ZANAFLEX) 4 MG tablet Take 1 tablet (4 mg total) by mouth every 8 (eight) hours as needed for muscle spasms. 11/19/15  Yes Adam Telford Nab, DO  topiramate (TOPAMAX) 50 MG tablet TAKE 1 TABLET  (50 MG) BY MOUTH AT BEDTIME. 08/02/16  Yes Pieter Partridge, DO  warfarin (COUMADIN) 5 MG tablet Take as directed by [facility] Patient taking differently: Take 5-7.5 mg by mouth See admin instructions. 5 mg daily; except on Friday;  patient take 7.5 mg 11/02/16  Yes [name], MD  loperamide (IMODIUM  A-D) 2 MG tablet Take 1 tablet (2 mg total) by mouth 4 (four) times daily as needed for diarrhea or loose stools. 02/13/17   Fredia Sorrow, MD  nitroGLYCERIN (NITROSTAT) 0.4 MG SL tablet Place 0.4 mg under the tongue every 5 (five) minutes as needed for chest pain.    Historical Provider, MD  ondansetron (ZOFRAN ODT) 4 MG disintegrating tablet Take 1 tablet (4 mg total) by mouth every 8 (eight) hours as needed for nausea or vomiting. 02/13/17   Fredia Sorrow, MD    Family History Family History  Problem Relation Age of Onset  . Pancreatic cancer Brother   . Diabetes Brother     4   . Stroke Brother   . Diabetes Mother   . Heart failure Mother   . Heart failure Father   . Cancer Brother   . Coronary artery disease Brother   . Hypothyroidism Brother   . Heart attack      Nephew  . Irregular heart beat Daughter   . Cancer Maternal Grandmother     unknown   . Other Brother     colon surgery    Social History Social History  Substance Use Topics  . Smoking status: Never Smoker  . Smokeless tobacco: Never Used  . Alcohol use No     Comment: stopped drinking in 1998     Allergies   Morphine and Shellfish-derived products   Review of Systems Review of Systems  Constitutional: Negative for fever.  Eyes: Negative for visual disturbance.  Respiratory: Negative for shortness of breath.   Cardiovascular: Negative for chest pain.  Gastrointestinal: Negative for abdominal pain.  Musculoskeletal: Negative for back pain.  Skin: Negative for rash.  Neurological: Positive for headaches.  Hematological: Bruises/bleeds easily.  Psychiatric/Behavioral: Negative for  confusion.     Physical Exam Updated Vital Signs BP 135/64 (BP Location: Right Arm)   Pulse 76   Temp 99.6 F (37.6 C) (Oral)   Resp 14   SpO2 96%   Physical Exam  Constitutional: He is oriented to person, place, and time. He appears well-developed and well-nourished. No distress.  HENT:  Head: Normocephalic and atraumatic.  Mouth/Throat: Oropharynx is clear and moist.  Eyes: EOM are normal. Pupils are equal, round, and reactive to light.  Neck: Normal range of motion. Neck supple.  Cardiovascular: Normal rate, regular rhythm and normal heart sounds.   Pulmonary/Chest: Effort normal and breath sounds normal. No respiratory distress.  Abdominal: Soft. Bowel sounds are normal. He exhibits no distension. There is no tenderness.  Musculoskeletal: Normal range of motion. He exhibits no edema.  Neurological: He is alert and oriented to person, place, and time. No cranial nerve deficit or sensory deficit. He exhibits normal muscle tone. Coordination normal.  Skin: Skin is warm. No rash noted.  Nursing note and vitals reviewed.    ED Treatments / Results  Labs (all labs ordered are listed, but only abnormal results are displayed) Labs Reviewed  CBC WITH DIFFERENTIAL/PLATELET - Abnormal; Notable for the following:       Result Value   Hemoglobin 12.1 (*)    HCT 36.7 (*)    Platelets 125 (*)    All other components within normal limits  BASIC METABOLIC PANEL - Abnormal; Notable for the following:    Glucose, Bld 298 (*)    Calcium 8.2 (*)    All other components within normal limits  VALPROIC ACID LEVEL    EKG  EKG Interpretation None  Radiology No results found.  Procedures Procedures (including critical care time)  Medications Ordered in ED Medications  0.9 %  sodium chloride infusion ( Intravenous New Bag/Given 02/13/17 1413)  ondansetron (ZOFRAN) injection 4 mg (not administered)  sodium chloride 0.9 % bolus 500 mL (500 mLs Intravenous New Bag/Given  02/13/17 1413)  ondansetron (ZOFRAN) injection 4 mg (4 mg Intravenous Given 02/13/17 1413)  HYDROmorphone (DILAUDID) injection 1 mg (1 mg Intravenous Given 02/13/17 1413)     Initial Impression / Assessment and Plan / ED Course  I have reviewed the triage vital signs and the nursing notes.  Pertinent labs & imaging results that were available during my care of the patient were reviewed by me and considered in my medical decision making (see chart for details).     Patient with acute onset last evening of nausea vomiting and diarrhea. No blood in either one. Several episodes of each. Suspect a viral gastroenteritis. Doubt that it's food poisoning. Patient treated her symptomatically. Labs without sniffing abnormalities. Patient overall feeling much better.  Patient's valproic acid level is normal.  Patient will be continued on Zofran and Imodium at home.  Patients head pain is chronic low bit worse since the onset of the illness but the same, pain in his had prolonged period of time and given trying to workup.  Final Clinical Impressions(s) / ED Diagnoses   Final diagnoses:  Gastroenteritis, acute    New Prescriptions New Prescriptions   LOPERAMIDE (IMODIUM A-D) 2 MG TABLET    Take 1 tablet (2 mg total) by mouth 4 (four) times daily as needed for diarrhea or loose stools.   ONDANSETRON (ZOFRAN ODT) 4 MG DISINTEGRATING TABLET    Take 1 tablet (4 mg total) by mouth every 8 (eight) hours as needed for nausea or vomiting.     Fredia Sorrow, MD 02/13/17 [phone]

## 2017-02-13 NOTE — ED Notes (Signed)
Patient's discharge papers with prescriptions were found on floor in [place].  Spoke with wife and notified her we will keep papers at [facility] in top drawer.  She stated they will be here to pick up papers on 3/12.

## 2017-02-17 MED FILL — LEVOTHYROXINE 88 MCG TABLET: 88 | 30 days supply | Qty: 30 | Fill #3

## 2017-03-03 ENCOUNTER — Encounter: Payer: Self-pay | Admitting: Family Medicine

## 2017-03-03 ENCOUNTER — Ambulatory Visit (INDEPENDENT_AMBULATORY_CARE_PROVIDER_SITE_OTHER): Payer: Medicare PPO | Admitting: Family Medicine

## 2017-03-03 DIAGNOSIS — M5481 Occipital neuralgia: Secondary | ICD-10-CM | POA: Diagnosis not present

## 2017-03-03 DIAGNOSIS — I10 Essential (primary) hypertension: Secondary | ICD-10-CM | POA: Diagnosis not present

## 2017-03-03 DIAGNOSIS — Z794 Long term (current) use of insulin: Secondary | ICD-10-CM | POA: Diagnosis not present

## 2017-03-03 DIAGNOSIS — E782 Mixed hyperlipidemia: Secondary | ICD-10-CM

## 2017-03-03 DIAGNOSIS — E1100 Type 2 diabetes mellitus with hyperosmolarity without nonketotic hyperglycemic-hyperosmolar coma (NKHHC): Secondary | ICD-10-CM

## 2017-03-03 DIAGNOSIS — G4733 Obstructive sleep apnea (adult) (pediatric): Secondary | ICD-10-CM | POA: Diagnosis not present

## 2017-03-03 LAB — COMPREHENSIVE METABOLIC PANEL
ALBUMIN: 4.4 g/dL (ref 3.6–5.1)
ALT: 17 U/L (ref 9–46)
AST: 16 U/L (ref 10–35)
Alkaline Phosphatase: 97 U/L (ref 40–115)
BUN: 12 mg/dL (ref 7–25)
CALCIUM: 9.6 mg/dL (ref 8.6–10.3)
CHLORIDE: 98 mmol/L (ref 98–110)
CO2: 25 mmol/L (ref 20–31)
CREATININE: 1.08 mg/dL (ref 0.70–1.25)
Glucose, Bld: 318 mg/dL — ABNORMAL HIGH (ref 65–99)
Potassium: 4.7 mmol/L (ref 3.5–5.3)
SODIUM: 135 mmol/L (ref 135–146)
TOTAL PROTEIN: 7.2 g/dL (ref 6.1–8.1)
Total Bilirubin: 0.5 mg/dL (ref 0.2–1.2)

## 2017-03-03 MED ORDER — INSULIN NPH (HUMAN) (ISOPHANE) 100 UNIT/ML ~~LOC~~ SUSP: SUBCUTANEOUS | 11 refills | Status: DC

## 2017-03-03 NOTE — Progress Notes (Signed)
Subjective:  I acted as a Education administrator for Dr. Charlett Blake. [place], [place]   Patient ID: [name] [name], male    DOB: 1946-07-11, 71 y.o.   MRN: [id number]  Chief Complaint  Patient presents with  . Follow-up  . Hyperlipidemia    HPI  Patient is in today for follow up for severe headaches. Patient is going to see Dr. [name] on Monday. Patient states the headaches are not getting any better he states he wakes up with headaches and they progressively get worse through out the day. No syncope or further head trauma. No nausea or vomiting. No radicular complaints and no recent seizure type activity. Blood sugars have been high again lately because he cannot afford his Tuojeo. Notes some polyuria or polydipsia. Denies CP/palp/SOB/HA/congestion/fevers/GI c/o. Taking meds as prescribed  Patient Care Team: [name], MD as PCP - General (Family Medicine) Pieter Partridge, DO as Consulting Physician (Neurology)   Past Medical History:  Diagnosis Date  . ACP (advance care planning) 05/11/2015  . Advance care planning 05/01/2015  . Anemia    hemoglobin 7.4, iron deficiency, January, 2011, 2 unit transfusion, endoscopy normal, capsule endoscopy February, 2011 no small bowel abnormalities.   Most likely source gastric erosions, followed by GI  . Anxiety   . CAD (coronary artery disease)    A. CABG in 2000,status post cardiac cath in 2006, 2009 ....continued chest pain and SOB despite oral medication adjestments including Ranexa. B. Cath November 2009/ mRCA - 2.75 x 23 Abbott Xience V drug-eluting stent ...11/26/2008 to distal  RCA leading to acute marginal.  C. Cath 07/2012 for CP - stable anatomy, med rx. d. cath 2015 and 05/30/2015 stable anatomy, consider Myoview if has CP again  . Cerebral ischemia    MRI November, 2010, chronic microvascular ischemia  . Chronic diastolic CHF (congestive heart failure) ([place]) 03/13/2016  . Depression    Bipolar  . Diabetes mellitus type II, uncontrolled ([place])   . Diabetes  mellitus without complication ([place])   . Diarrhea 02/15/2016  . Dizziness   . Edema   . Ejection fraction    EF 60%, echo, July 31, 2012  . Falling episodes    these have occurred in the past and again recurring 2011  . Family history of adverse reaction to anesthesia    "mother died during bypass surgery but not sure if it has to do with anesthesia"  . Gastric ulcer   . GERD (gastroesophageal reflux disease)   . H/O medication noncompliance    Due to loss of insurance  . Hard of hearing   . Headache 01/25/2016  . Hx of CABG    2000,  / one median sternotomy suture broken her chest x-ray November, 2010, no clinical significance  . Hyperlipidemia   . Hypertension    pt. denies  . Iron deficiency anemia   . Low back pain 06/12/2009   Qualifier: Diagnosis of  By: [name]   . Nausea with vomiting 01/25/2016  . Nephrolithiasis   . OSA (obstructive sleep apnea)   . Pain in limb 06/12/2009   Qualifier: Diagnosis of  By: [name]   . Palpitations    event recorder showed sinus rhythm  . RBBB (right bundle branch block)   . Restless leg   . Right knee pain 01/07/2015  . Shortness of breath    CPX April, 2011, mild functional limitation, no clear pulmonary or cardiac limitation, possible deconditioning and mild chronotropic incompetence(  peak heart rate 130)  . Spondylosis    C5-6, C6-7 MRI 2010  . Syncope 03/2016  . Thyroid disease   . Tubulovillous adenoma of colon 2007  . Wears glasses     Past Surgical History:  Procedure Laterality Date  . ANTERIOR CERVICAL DECOMP/DISCECTOMY FUSION N/A 05/31/2016   Procedure: ANTERIOR CERVICAL DECOMPRESSION/DISCECTOMY FUSION CERVICAL FIVE-SIX,CERVICAL SIX-SEVEN;  Surgeon: [name], MD;  Location: [facility];  Service: Neurosurgery;  Laterality: N/A;  . CARDIAC CATHETERIZATION N/A 05/30/2015   Procedure: Left Heart Cath and Coronary Angiography;  Surgeon: [name], MD;  Location: [facility] LAB;  Service:  Cardiovascular;  Laterality: N/A;  . COLONOSCOPY    . CORONARY ARTERY BYPASS GRAFT     2000  . ESOPHAGOGASTRODUODENOSCOPY    . LEFT HEART CATHETERIZATION WITH CORONARY/GRAFT ANGIOGRAM N/A 08/01/2012   Procedure: LEFT HEART CATHETERIZATION WITH Beatrix Fetters;  Surgeon: [name], MD;  Location: [facility] CATH LAB;  Service: Cardiovascular;  Laterality: N/A;  . LEFT HEART CATHETERIZATION WITH CORONARY/GRAFT ANGIOGRAM N/A 01/03/2015   Procedure: LEFT HEART CATHETERIZATION WITH Beatrix Fetters;  Surgeon: [name], MD;  Location: [facility] LAB;  Service: Cardiovascular;  Laterality: N/A;  . NASAL SEPTUM SURGERY     UP3  . PERCUTANEOUS CORONARY STENT INTERVENTION (PCI-S)  10/2008   mRCA PCI  2.75 x 23 Abbott Xience V drug-eluting stent     Family History  Problem Relation Age of Onset  . Pancreatic cancer Brother   . Diabetes Brother     4   . Stroke Brother   . Diabetes Mother   . Heart failure Mother   . Heart failure Father   . Cancer Brother   . Coronary artery disease Brother   . Hypothyroidism Brother   . Heart attack      Nephew  . Irregular heart beat Daughter   . Cancer Maternal Grandmother     unknown   . Other Brother     colon surgery    Social History   Social History  . Marital status: Married    Spouse name: N/A  . Number of children: 4  . Years of education: 72   Occupational History  . retired    Social History Main Topics  . Smoking status: Never Smoker  . Smokeless tobacco: Never Used  . Alcohol use No     Comment: stopped drinking in 1998  . Drug use: No  . Sexual activity: Yes    Partners: Female   Other Topics Concern  . Not on file   Social History Narrative   Patient is right handed.   Patient does not drink caffeine.    Outpatient Medications Prior to Visit  Medication Sig Dispense Refill  . albuterol (PROVENTIL HFA;VENTOLIN HFA) 108 (90 Base) MCG/ACT inhaler Inhale 2 puffs into the lungs every 6 (six) hours  as needed for wheezing or shortness of breath. 1 Inhaler 0  . amiodarone (PACERONE) 400 MG tablet Take 1 tablet (400 mg total) by mouth daily. 30 tablet 0  . atorvastatin (LIPITOR) 40 MG tablet Take 1.5 tablets (60 mg total) by mouth daily. 45 tablet 3  . diclofenac sodium (VOLTAREN) 1 % GEL Apply 2 g topically daily as needed (for pain in affected area).    . divalproex (DEPAKOTE ER) 500 MG 24 hr tablet Take 1 tablet (500 mg total) by mouth daily. 30 tablet 2  . gabapentin (NEURONTIN) 300 MG capsule Take 2 capsules (600 mg) by mouth  every morning, 1 capsule (300 mg) by mouth  at noon, and 2 capsules (600 mg) by mouth at night    . glucose blood (ACCU-CHEK AVIVA) test strip Use as directed to check blood sugar 4 times daily.  DX E11.9 200 each 6  . insulin aspart (NOVOLOG) 100 UNIT/ML injection Inject 10-17 Units into the skin 3 (three) times daily with meals. Use 10-17 unit 3 times daily before meals 15 mL 4  . Insulin Glargine (TOUJEO SOLOSTAR) 300 UNIT/ML SOPN Inject 42 Units into the skin at bedtime. (Patient taking differently: Inject 48 Units into the skin at bedtime. ) 3 pen 4  . isosorbide mononitrate (IMDUR) 60 MG 24 hr tablet TAKE TWO TABLETS BY MOUTH DAILY 180 tablet 0  . levothyroxine (SYNTHROID, LEVOTHROID) 88 MCG tablet Take 1 tablet (88 mcg total) by mouth daily before breakfast. 30 tablet 3  . loperamide (IMODIUM A-D) 2 MG tablet Take 1 tablet (2 mg total) by mouth 4 (four) times daily as needed for diarrhea or loose stools. 30 tablet 0  . meclizine (ANTIVERT) 12.5 MG tablet Take 1 tablet (12.5 mg total) by mouth 3 (three) times daily as needed for dizziness. 10 tablet 0  . metFORMIN (GLUCOPHAGE) 1000 MG tablet TAKE 1 TABLET (1,000 MG TOTAL) BY MOUTH 2 (TWO) TIMES DAILY WITH A MEAL. 60 tablet 5  . metoprolol succinate (TOPROL-XL) 50 MG 24 hr tablet Take 1 and a half tablets once a day for a total of 75 mg. (Patient taking differently: Take 75 mg by mouth daily. ) 45 tablet 6  .  midodrine (PROAMATINE) 10 MG tablet Take 1 tablet (10 mg total) by mouth 3 (three) times daily. 90 tablet 3  . nitroGLYCERIN (NITROSTAT) 0.4 MG SL tablet Place 0.4 mg under the tongue every 5 (five) minutes as needed for chest pain.    Marland Kitchen ondansetron (ZOFRAN ODT) 4 MG disintegrating tablet Take 1 tablet (4 mg total) by mouth every 8 (eight) hours as needed for nausea or vomiting. 10 tablet 1  . pantoprazole (PROTONIX) 40 MG tablet TAKE 1 TABLET BY MOUTH TWICE DAILY FOR 2 WEEKS THEN DROP TO ONCE DAILY 45 tablet 5  . pramipexole (MIRAPEX) 1 MG tablet TAKE 1/2 TO 1 TABLET BY MOUTH AT BEDTIME 30 tablet 2  . sertraline (ZOLOFT) 100 MG tablet Take 1 tablet (100 mg total) by mouth 2 (two) times daily. 180 tablet 1  . tiZANidine (ZANAFLEX) 4 MG tablet Take 1 tablet (4 mg total) by mouth every 8 (eight) hours as needed for muscle spasms. 90 tablet 2  . topiramate (TOPAMAX) 50 MG tablet TAKE 1 TABLET (50 MG) BY MOUTH AT BEDTIME. 30 tablet 3  . warfarin (COUMADIN) 5 MG tablet Take as directed by [facility] (Patient taking differently: Take 5-7.5 mg by mouth See admin instructions. 5 mg daily; except on Friday;  patient take 7.5 mg) 35 tablet 3   No facility-administered medications prior to visit.     Allergies  Allergen Reactions  . Morphine Other (See Comments)    hallucinations  . Shellfish-Derived Products Itching    ROS     Objective:    Physical Exam  BP 118/62 (BP Location: Left Arm, Patient Position: Sitting, Cuff Size: Normal)   Pulse 61   Temp 98.1 F (36.7 C) (Oral)   Resp 18   Wt 172 lb (78 kg)   SpO2 98%   BMI 25.40 kg/m  Wt Readings from Last 3 Encounters:  03/03/17 172 lb (78 kg)  01/21/17 165 lb 12.8 oz (75.2 kg)  01/21/17 166 lb 6.4 oz (75.5 kg)   BP Readings from Last 3 Encounters:  03/03/17 118/62  02/13/17 133/65  01/21/17 110/68     Immunization History  Administered Date(s) Administered  . Influenza Split 09/21/2012  . Influenza Whole 10/16/2007,  10/01/2008, 09/23/2009, 09/22/2010  . Influenza,inj,Quad PF,36+ Mos 08/22/2015, 08/22/2015, 08/24/2016  . Influenza-Unspecified 09/04/2014  . Pneumococcal Conjugate-13 08/24/2016  . Pneumococcal Polysaccharide-23 12/18/2008, 01/03/2015  . Td 12/18/2007  . Zoster 11/25/2010, 11/30/2012    Health Maintenance  Topic Date Due  . Hepatitis C Screening  11-21-1946  . FOOT EXAM  11/26/2011  . OPHTHALMOLOGY EXAM  06/20/2012  . URINE MICROALBUMIN  04/30/2016  . INFLUENZA VACCINE  07/06/2017  . HEMOGLOBIN A1C  07/21/2017  . TETANUS/TDAP  12/17/2017  . COLONOSCOPY  02/11/2019  . PNA vac Low Risk Adult  Completed    Lab Results  Component Value Date   WBC 5.7 02/13/2017   HGB 12.1 (L) 02/13/2017   HCT 36.7 (L) 02/13/2017   PLT 125 (L) 02/13/2017   GLUCOSE 318 (H) 03/03/2017   CHOL 152 01/21/2017   TRIG 280 (H) 01/21/2017   HDL 37 (L) 01/21/2017   LDLDIRECT 25.0 02/09/2016   LDLCALC 59 01/21/2017   ALT 17 03/03/2017   AST 16 03/03/2017   NA 135 03/03/2017   K 4.7 03/03/2017   CL 98 03/03/2017   CREATININE 1.08 03/03/2017   BUN 12 03/03/2017   CO2 25 03/03/2017   TSH 3.97 01/21/2017   PSA 2.39 11/25/2010   INR 2.4 02/11/2017   HGBA1C 9.5 (H) 01/21/2017   MICROALBUR 1.1 05/01/2015    Lab Results  Component Value Date   TSH 3.97 01/21/2017   Lab Results  Component Value Date   WBC 5.7 02/13/2017   HGB 12.1 (L) 02/13/2017   HCT 36.7 (L) 02/13/2017   MCV 86.8 02/13/2017   PLT 125 (L) 02/13/2017   Lab Results  Component Value Date   NA 135 03/03/2017   K 4.7 03/03/2017   CO2 25 03/03/2017   GLUCOSE 318 (H) 03/03/2017   BUN 12 03/03/2017   CREATININE 1.08 03/03/2017   BILITOT 0.5 03/03/2017   ALKPHOS 97 03/03/2017   AST 16 03/03/2017   ALT 17 03/03/2017   PROT 7.2 03/03/2017   ALBUMIN 4.4 03/03/2017   CALCIUM 9.6 03/03/2017   CALCIUM 9.6 03/03/2017   ANIONGAP 11 02/13/2017   GFR 69.23 01/04/2017   Lab Results  Component Value Date   CHOL 152 01/21/2017    Lab Results  Component Value Date   HDL 37 (L) 01/21/2017   Lab Results  Component Value Date   LDLCALC 59 01/21/2017   Lab Results  Component Value Date   TRIG 280 (H) 01/21/2017   Lab Results  Component Value Date   CHOLHDL 4.1 01/21/2017   Lab Results  Component Value Date   HGBA1C 9.5 (H) 01/21/2017         Assessment & Plan:   Problem List Items Addressed This Visit    Essential hypertension (Chronic)    Well controlled, no changes to meds. Encouraged heart healthy diet such as the DASH diet and exercise as tolerated.       Diabetes mellitus type II, uncontrolled ([place]) (Chronic)    Patient once again not picking up insulin due to cost. Will try Relion NPH bid when he is unable to get Tuojeo. Continue regular insulin tid with meals, avoid simple carbs  Relevant Medications   insulin NPH Human (HUMULIN N,NOVOLIN N) 100 UNIT/ML injection   Occipital neuralgia of right side    Chronic headache persists with a complicated neurologic history including concussion, seizures and memory loss has follow up appt with neurology soon.      HLD (hyperlipidemia)    Tolerating statin, encouraged heart healthy diet, avoid trans fats, minimize simple carbs and saturated fats. Increase exercise as tolerated       Other Visit Diagnoses    Hypocalcemia    -  Primary   Relevant Orders   PTH, Intact and Calcium (Completed)   Vitamin D 1,25 dihydroxy (Completed)   Comp Met (CMET) (Completed)      I am having Mr. Miner start on insulin NPH Human. I am also having him maintain his tiZANidine, glucose blood, gabapentin, insulin aspart, Insulin Glargine, diclofenac sodium, nitroGLYCERIN, pantoprazole, topiramate, sertraline, metoprolol succinate, amiodarone, midodrine, meclizine, levothyroxine, warfarin, isosorbide mononitrate, albuterol, pramipexole, divalproex, atorvastatin, metFORMIN, ondansetron, and loperamide.  Meds ordered this encounter  Medications  . insulin  NPH Human (HUMULIN N,NOVOLIN N) 100 UNIT/ML injection    Sig: Inject 10-20 Units Twice Daily as instructed    Dispense:  10 mL    Refill:  11    CMA served as scribe during this visit. History, Physical and Plan performed by medical provider. Documentation and orders reviewed and attested to.  Penni Homans, MD

## 2017-03-03 NOTE — Progress Notes (Signed)
Pre visit review using our clinic review tool, if applicable. No additional management support is needed unless otherwise documented below in the visit note. 

## 2017-03-03 NOTE — Patient Instructions (Signed)
Carbohydrate Counting for Diabetes Mellitus, Adult Carbohydrate counting is a method for keeping track of how many carbohydrates you eat. Eating carbohydrates naturally increases the amount of sugar (glucose) in the blood. Counting how many carbohydrates you eat helps keep your blood glucose within normal limits, which helps you manage your diabetes (diabetes mellitus). It is important to know how many carbohydrates you can safely have in each meal. This is different for every person. A diet and nutrition specialist (registered dietitian) can help you make a meal plan and calculate how many carbohydrates you should have at each meal and snack. Carbohydrates are found in the following foods:  Grains, such as breads and cereals.  Dried beans and soy products.  Starchy vegetables, such as potatoes, peas, and corn.  Fruit and fruit juices.  Milk and yogurt.  Sweets and snack foods, such as cake, cookies, candy, chips, and soft drinks. How do I count carbohydrates? There are two ways to count carbohydrates in food. You can use either of the methods or a combination of both. Reading "Nutrition Facts" on packaged food  The "Nutrition Facts" list is included on the labels of almost all packaged foods and beverages in the [place] It includes:  The serving size.  Information about nutrients in each serving, including the grams (g) of carbohydrate per serving. To use the "Nutrition Facts":  Decide how many servings you will have.  Multiply the number of servings by the number of carbohydrates per serving.  The resulting number is the total amount of carbohydrates that you will be having. Learning standard serving sizes of other foods  When you eat foods containing carbohydrates that are not packaged or do not include "Nutrition Facts" on the label, you need to measure the servings in order to count the amount of carbohydrates:  Measure the foods that you will eat with a food scale or measuring  cup, if needed.  Decide how many standard-size servings you will eat.  Multiply the number of servings by 15. Most carbohydrate-rich foods have about 15 g of carbohydrates per serving.  For example, if you eat 8 oz (170 g) of strawberries, you will have eaten 2 servings and 30 g of carbohydrates (2 servings x 15 g = 30 g).  For foods that have more than one food mixed, such as soups and casseroles, you must count the carbohydrates in each food that is included. The following list contains standard serving sizes of common carbohydrate-rich foods. Each of these servings has about 15 g of carbohydrates:   hamburger bun or  English muffin.   oz (15 mL) syrup.   oz (14 g) jelly.  1 slice of bread.  1 six-inch tortilla.  3 oz (85 g) cooked rice or pasta.  4 oz (113 g) cooked dried beans.  4 oz (113 g) starchy vegetable, such as peas, corn, or potatoes.  4 oz (113 g) hot cereal.  4 oz (113 g) mashed potatoes or  of a large baked potato.  4 oz (113 g) canned or frozen fruit.  4 oz (120 mL) fruit juice.  4-6 crackers.  6 chicken nuggets.  6 oz (170 g) unsweetened dry cereal.  6 oz (170 g) plain fat-free yogurt or yogurt sweetened with artificial sweeteners.  8 oz (240 mL) milk.  8 oz (170 g) fresh fruit or one small piece of fruit.  24 oz (680 g) popped popcorn. Example of carbohydrate counting Sample meal  3 oz (85 g) chicken breast.  6 oz (  170 g) brown rice.  4 oz (113 g) corn.  8 oz (240 mL) milk.  8 oz (170 g) strawberries with sugar-free whipped topping. Carbohydrate calculation 1. Identify the foods that contain carbohydrates:  Rice.  Corn.  Milk.  Strawberries. 2. Calculate how many servings you have of each food:  2 servings rice.  1 serving corn.  1 serving milk.  1 serving strawberries. 3. Multiply each number of servings by 15 g:  2 servings rice x 15 g = 30 g.  1 serving corn x 15 g = 15 g.  1 serving milk x 15 g = 15  g.  1 serving strawberries x 15 g = 15 g. 4. Add together all of the amounts to find the total grams of carbohydrates eaten:  30 g + 15 g + 15 g + 15 g = 75 g of carbohydrates total. This information is not intended to replace advice given to you by your health care provider. Make sure you discuss any questions you have with your health care provider. Document Released: 11/22/2005 Document Revised: 06/11/2016 Document Reviewed: 05/05/2016 Elsevier Interactive Patient Education  2017 [employer]  

## 2017-03-04 LAB — PTH, INTACT AND CALCIUM
CALCIUM: 9.6 mg/dL (ref 8.6–10.3)
PTH: 37 pg/mL (ref 14–64)

## 2017-03-06 LAB — VITAMIN D 1,25 DIHYDROXY
Vitamin D 1, 25 (OH)2 Total: 14 pg/mL — ABNORMAL LOW (ref 18–72)
Vitamin D2 1, 25 (OH)2: <8 pg/mL
Vitamin D3 1, 25 (OH)2: 14 pg/mL

## 2017-03-06 NOTE — Assessment & Plan Note (Signed)
Patient once again not picking up insulin due to cost. Will try Relion NPH bid when he is unable to get Tuojeo. Continue regular insulin tid with meals, avoid simple carbs

## 2017-03-06 NOTE — Assessment & Plan Note (Signed)
Well controlled, no changes to meds. Encouraged heart healthy diet such as the DASH diet and exercise as tolerated.  °

## 2017-03-06 NOTE — Assessment & Plan Note (Signed)
Tolerating statin, encouraged heart healthy diet, avoid trans fats, minimize simple carbs and saturated fats. Increase exercise as tolerated 

## 2017-03-06 NOTE — Assessment & Plan Note (Signed)
Chronic headache persists with a complicated neurologic history including concussion, seizures and memory loss has follow up appt with neurology soon.

## 2017-03-07 ENCOUNTER — Ambulatory Visit (INDEPENDENT_AMBULATORY_CARE_PROVIDER_SITE_OTHER): Payer: Medicare PPO | Admitting: Psychology

## 2017-03-07 ENCOUNTER — Encounter: Payer: Self-pay | Admitting: Psychology

## 2017-03-07 DIAGNOSIS — R413 Other amnesia: Secondary | ICD-10-CM

## 2017-03-07 DIAGNOSIS — S060X1S Concussion with loss of consciousness of 30 minutes or less, sequela: Secondary | ICD-10-CM | POA: Diagnosis not present

## 2017-03-07 NOTE — Progress Notes (Signed)
NEUROPSYCHOLOGICAL INTERVIEW (CPT: [id number])  Name: [name] [name] Date of Birth: [birth year] Date of Interview: 03/07/2017  Reason for Referral:  [name] [name] is a 71 y.o. right handed male who is referred for neuropsychological evaluation by Dr. Metta Clines of [facility] due to concerns about episodes of confusion and memory loss. This patient is accompanied in the office by his wife who supplements the history.  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 The batter caught the patient's right temple with his bat. [name] [name] did not lose consciousness but "felt loopy". Medical records note that he had also taken two hits in the face mask the day before this incident. At home, he laid down to rest and his family was unable to awaken him. EMS was called and he was taken to the ED where CT scan of the head was unremarkable. The patient was told he had a concussion. He saw Dr. Jannifer Franklin of [facility] on 02/18/2015 and was placed on Topamax for post-traumatic migraine. CT of the head on 02/26/2015 was reportedly stable. He began seeing Dr. Tomi [name] in early April 2016. The patient continued Topamax and had a right sided occipital nerve block. Later that month, he he developed gradual fatigue, weakness and slurred speech. He went to the ED on 03/28/2015. Blood glucose was in the 400s. He had reportedly run out of insulin. He had a non-focal exam. His symptoms were thought to be due to hyperglycemia. In early 2017, the patient demonstrated a decline in cognition, had approximately four falls, appeared more agitated and was more disoriented after naps. It was noted that he had a past history of confusion related to hyperglycemia  with sugars in the 700s. However, the patient reported that he had been keeping his sugars no higher than 130s-140s. He also had an increase in headaches. His family reported that he had been having mild memory problems for at least a year (exact onset unknown), with occasional disorientation on familiar routes when driving, and difficulty managing bills. MMSE on 01/15/2016 was 23/30. Dr. Tomi [name] suspected he may have an underlying neurodegenerative disease/dementia. MRI of the brain completed on 03/10/2016 revealed no acute finding or explanation for the history. Mild chronic small vessel disease was noted. Labwork revealed very low vitamin D and he was started on oral supplement. From February - April 2017, he reportedly had episodes of suddenly feeling lightheaded, diaphoretic with palpitations and then tunnel vision, followed by passing out. Thre were no convulsions, bladder incontinence or tongue biting. Upon waking up he would be very confused with slurred speech from 30-60 minutes. He was admitted to the hospital twice after episodes. He demonstrated severe orthostatic hypotension. Routine EEGs and long term EEG monitoring for 24 hours were all normal (although no event was captured). Dr. Tomi [name] considered whether memory deficits may be related to recurrent cerebral hypoperfusion. Repeat 72 hour ambulatory EEG from 5/1-04/08/2016 was normal. Cardiac workup revealed NSR with occasional PVCs, as well as atrial fibrillation and flutter. He was started on Coumadin in addition to Plavix. He continued to have episodes of syncope and falls. He underwent anterior cervical decompression and fusion in 05/2016. In January 2018, his wife observed seizure like activity (shaking) while he was sleeping.  He had urinary incontinence but did not bite his tongue. EMS was called and he was taken to the ED where CT of the head was unremarkable. [name] [name] was last seen by Dr. Tomi [name] on 01/21/2017. He reported that headaches had increased  and that he sometimes shuffles and had tremor in his hands, especially on the left. He sometimes had difficulty initiating walking upon standing up. Per Dr. Tomi [name]: "Cognitive impairment may be a neurodegenerative disorder or possibly related to cerebral hypoperfusion from recurrent syncope.  He and his wife endorse shuffling gait, tremor, occasional double vision and freezing.  In conjunction with his dysautonomia, one might consider multiple system atrophy.  However, I don't appreciate any parkinsonism or gaze paresis on exam." Depakote was started to address headaches as well as seizure.   At today's visit, [name] [name] is accompanied by his wife but she notes that she cannot answer some questions because she herself has memory impairment from a prior stroke. She is not sure when the memory issues began, but she thinks it was after his concussion in 2016. She reported that he is frequently disoriented upon waking up and may ask where he is. He also has had some episodes of getting lost when driving. She reported that he forgets recent conversations, repeats questions/statements and misplaces items more than he used to. He sometimes does not remember recent events. He endorses slowed information processing speed. They do not think he is having more trouble with attention/concentration. He endorses word finding difficulty. He finds written tasks like filling out paperwork or managing the bills much more confusing and he is prone to making errors, so his wife does all of this now.   His wife is also concerned about his frequent dozing off during the day without meaning to. This most frequently happens when he is watching television and not in conversation with anyone. He also has fallen asleep in the middle of searching channels on the TV remote and while holding a cup of coffee. She reports he falls into a very deep sleep very quickly. He does have sleep apnea and wears his CPAP nightly. He does take it off some  nights. He has always been able to fall asleep pretty easily but it was not like this before. They are unsure when this started happening.  The patient and his wife also reported frequent stumbling, as well as continued headache and neck pain. He has not had any falls recently.   There is no known family history of dementia or neurodegenerative disease.   Current Functioning: [name] [name] lives in a private residence with his wife, daughter and 49yo grandson. As noted previously, he is having difficulty managing many instrumental ADLs including driving and finances. His wife took over management of his medications as well. She also manages his appointments. He does all the housecleaning, laundry and dishes. He can cook simple items without any difficulty.  With regard to mood, his wife reports that he is more irritable. 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 He enjoyed that very much. He does still enjoy spending time with his 42 yo grandson who lives in the same home. This keeps him active. He is not allowed to watch his grandson independently, however, as the family worries about his confusion and his tendency to fall asleep unintentionally.  The patient denied depressed mood or significant sadness on a regular basis. He endorsed anxiety characterized by  frequent worrying about his family. His appetite is reduced. He does not eat as much and he has lost a fair amount of weight unintentionally.   Of note, the patient endorses auditory hallucinations. He frequently hears his deceased relatives (mother, father, brother) speaking in his house. Sometimes they speak to him. He denies any command hallucinations. He does not find it disturbing or upsetting. He does not have visual hallucinations.  He denied past or present suicidal ideation or intention.   Psychiatric History: [name] [name] was diagnosed with bipolar  disorder approximately 25 years ago. All four of his daughters have also been diagnosed with bipolar disorder. The patient and his wife cannot recall what initially led to the diagnosis. Upon questioning, it does not seem he has a history of mania. He does have a history of depression and he takes sertraline for this.  His wife also reports that he "got hateful" after his bypass surgery in 2000. She stated that they were told by a doctor that this is very common after coronary bypass surgery. He was put on sertraline at that time. He has seen a psychiatrist in the past.  He denied history of SI/HI. He denied history of substance abuse/dependence.   Social History: Born/Raised: [place] Education: High school Occupational history: The patient worked in [employer] up until he had his bypass surgery. He endorsed chemical exposure in his work. He went on disability after that and did not work again. He is retired. Marital history: Married x35 years. 4 daughters, 7 grandchildren. Alcohol/Tobacco/Substances: Not a drinker (1 drink every few years). Never a smoker. No SA.   Medical History: Past Medical History:  Diagnosis Date  . ACP (advance care planning) 05/11/2015  . Advance care planning 05/01/2015  . Anemia    hemoglobin 7.4, iron deficiency, January, 2011, 2 unit transfusion, endoscopy normal, capsule endoscopy February, 2011 no small bowel abnormalities.   Most likely source gastric erosions, followed by GI  . Anxiety   . CAD (coronary artery disease)    A. CABG in 2000,status post cardiac cath in 2006, 2009 ....continued chest pain and SOB despite oral medication adjestments including Ranexa. B. Cath November 2009/ mRCA - 2.75 x 23 Abbott Xience V drug-eluting stent ...11/26/2008 to distal  RCA leading to acute marginal.  C. Cath 07/2012 for CP - stable anatomy, med rx. d. cath 2015 and 05/30/2015 stable anatomy, consider Myoview if has CP again  . Cerebral ischemia    MRI November,  2010, chronic microvascular ischemia  . Chronic diastolic CHF (congestive heart failure) ([place]) 03/13/2016  . Depression    Bipolar  . Diabetes mellitus type II, uncontrolled ([place])   . Diabetes mellitus without complication ([place])   . Diarrhea 02/15/2016  . Dizziness   . Edema   . Ejection fraction    EF 60%, echo, July 31, 2012  . Falling episodes    these have occurred in the past and again recurring 2011  . Family history of adverse reaction to anesthesia    "mother died during bypass surgery but not sure if it has to do with anesthesia"  . Gastric ulcer   . GERD (gastroesophageal reflux disease)   . H/O medication noncompliance    Due to loss of insurance  . Hard of hearing   . Headache 01/25/2016  . Hx of CABG    2000,  / one median sternotomy suture broken her chest x-ray November, 2010, no clinical significance  . Hyperlipidemia   . Hypertension  pt. denies  . Iron deficiency anemia   . Low back pain 06/12/2009   Qualifier: Diagnosis of  By: [name]   . Nausea with vomiting 01/25/2016  . Nephrolithiasis   . OSA (obstructive sleep apnea)   . Pain in limb 06/12/2009   Qualifier: Diagnosis of  By: [name]   . Palpitations    event recorder showed sinus rhythm  . RBBB (right bundle branch block)   . Restless leg   . Right knee pain 01/07/2015  . Shortness of breath    CPX April, 2011, mild functional limitation, no clear pulmonary or cardiac limitation, possible deconditioning and mild chronotropic incompetence( peak heart rate 130)  . Spondylosis    C5-6, C6-7 MRI 2010  . Syncope 03/2016  . Thyroid disease   . Tubulovillous adenoma of colon 2007  . Wears glasses       Current Medications:  Outpatient Encounter Prescriptions as of 03/07/2017  Medication Sig  . albuterol (PROVENTIL HFA;VENTOLIN HFA) 108 (90 Base) MCG/ACT inhaler Inhale 2 puffs into the lungs every 6 (six) hours as needed for wheezing or shortness of breath.  Marland Kitchen amiodarone (PACERONE) 400  MG tablet Take 1 tablet (400 mg total) by mouth daily.  Marland Kitchen atorvastatin (LIPITOR) 40 MG tablet Take 1.5 tablets (60 mg total) by mouth daily.  . diclofenac sodium (VOLTAREN) 1 % GEL Apply 2 g topically daily as needed (for pain in affected area).  . divalproex (DEPAKOTE ER) 500 MG 24 hr tablet Take 1 tablet (500 mg total) by mouth daily.  Marland Kitchen gabapentin (NEURONTIN) 300 MG capsule Take 2 capsules (600 mg) by mouth  every morning, 1 capsule (300 mg) by mouth  at noon, and 2 capsules (600 mg) by mouth at night  . glucose blood (ACCU-CHEK AVIVA) test strip Use as directed to check blood sugar 4 times daily.  DX E11.9  . insulin aspart (NOVOLOG) 100 UNIT/ML injection Inject 10-17 Units into the skin 3 (three) times daily with meals. Use 10-17 unit 3 times daily before meals  . Insulin Glargine (TOUJEO SOLOSTAR) 300 UNIT/ML SOPN Inject 42 Units into the skin at bedtime. (Patient taking differently: Inject 48 Units into the skin at bedtime. )  . insulin NPH Human (HUMULIN N,NOVOLIN N) 100 UNIT/ML injection Inject 10-20 Units Twice Daily as instructed  . isosorbide mononitrate (IMDUR) 60 MG 24 hr tablet TAKE TWO TABLETS BY MOUTH DAILY  . levothyroxine (SYNTHROID, LEVOTHROID) 88 MCG tablet Take 1 tablet (88 mcg total) by mouth daily before breakfast.  . loperamide (IMODIUM A-D) 2 MG tablet Take 1 tablet (2 mg total) by mouth 4 (four) times daily as needed for diarrhea or loose stools.  . meclizine (ANTIVERT) 12.5 MG tablet Take 1 tablet (12.5 mg total) by mouth 3 (three) times daily as needed for dizziness.  . metFORMIN (GLUCOPHAGE) 1000 MG tablet TAKE 1 TABLET (1,000 MG TOTAL) BY MOUTH 2 (TWO) TIMES DAILY WITH A MEAL.  . metoprolol succinate (TOPROL-XL) 50 MG 24 hr tablet Take 1 and a half tablets once a day for a total of 75 mg. (Patient taking differently: Take 75 mg by mouth daily. )  . midodrine (PROAMATINE) 10 MG tablet Take 1 tablet (10 mg total) by mouth 3 (three) times daily.  . nitroGLYCERIN  (NITROSTAT) 0.4 MG SL tablet Place 0.4 mg under the tongue every 5 (five) minutes as needed for chest pain.  Marland Kitchen ondansetron (ZOFRAN ODT) 4 MG disintegrating tablet Take 1 tablet (4 mg  total) by mouth every 8 (eight) hours as needed for nausea or vomiting.  . pantoprazole (PROTONIX) 40 MG tablet TAKE 1 TABLET BY MOUTH TWICE DAILY FOR 2 WEEKS THEN DROP TO ONCE DAILY  . pramipexole (MIRAPEX) 1 MG tablet TAKE 1/2 TO 1 TABLET BY MOUTH AT BEDTIME  . sertraline (ZOLOFT) 100 MG tablet Take 1 tablet (100 mg total) by mouth 2 (two) times daily.  Marland Kitchen tiZANidine (ZANAFLEX) 4 MG tablet Take 1 tablet (4 mg total) by mouth every 8 (eight) hours as needed for muscle spasms.  Marland Kitchen topiramate (TOPAMAX) 50 MG tablet TAKE 1 TABLET (50 MG) BY MOUTH AT BEDTIME.  Marland Kitchen warfarin (COUMADIN) 5 MG tablet Take as directed by [facility] (Patient taking differently: Take 5-7.5 mg by mouth See admin instructions. 5 mg daily; except on Friday;  patient take 7.5 mg)   No facility-administered encounter medications on file as of 03/07/2017.      Behavioral Observations:   Appearance: Neatly and appropriately dressed and groomed Gait: Ambulated independently, no gross abnormalities observed Speech: Fluent; normal rate, rhythm and volume Thought process: Linear, goal directed Affect: Blunted Interpersonal: Pleasant, appropriate, engaged   TESTING: There is medical necessity to proceed with neuropsychological assessment as the results will be used to aid in differential diagnosis and clinical decision-making and to inform specific treatment recommendations. Per the patient, his wife and medical records reviewed, there has been a change in cognitive functioning and a reasonable suspicion of dementia. The patient completed a full battery of neuropsychological testing with a psychometrician under my supervision, following our interview. Education regarding testing procedures was provided.  PLAN: The patient will return in one week to  see me for a follow-up session at which time his test performances and my impressions and treatment recommendations will be reviewed in detail.   Full neuropsychological evaluation report to follow.

## 2017-03-07 NOTE — Progress Notes (Signed)
   Neuropsychology Note  [name] [name] came in today for 1 hour of neuropsychological testing with technician, [name] [name], BS, under the supervision of Dr. Macarthur Critchley. The patient did not appear overtly distressed by the testing session, per behavioral observation or via self-report to the technician. Rest breaks were offered. [name] [name] will return next week for a feedback session with Dr. Si Raider at which time his test performances, clinical impressions and treatment recommendations will be reviewed in detail. The patient understands he can contact our office should he require our assistance before this time.  Full report to follow.

## 2017-03-12 NOTE — Progress Notes (Signed)
NEUROPSYCHOLOGICAL EVALUATION   Name:    [name] [name]  Date of Birth:   11-08-1946 Date of Interview and Testing:03/07/2017 Date of Testing:  03/07/2017   Date of Feedback:  03/14/2017       Background Information:  Reason for Referral:  [name] [name] is a 71 y.o. right handed male referred by Dr. Metta Clines to assess his current level of cognitive functioning and assist in differential diagnosis. The current evaluation consisted of a review of available medical records, an interview with the patient and his wife, and the completion of a neuropsychological testing battery. Informed consent was obtained.  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 The batter caught the patient's right temple with his bat. [name] [name] did not lose consciousness but "felt loopy". Medical records note that he had also taken two hits in the face mask the day before this incident. At home, he laid down to rest and his family was unable to awaken him. EMS was called and he was taken to the ED where CT scan of the head was unremarkable. The patient was told he had a concussion. He saw Dr. Jannifer Franklin of [facility] on 02/18/2015 and was placed on Topamax for post-traumatic migraine. CT of the head on 02/26/2015 was reportedly stable. He began seeing Dr. [name] in early April 2016. The patient continued Topamax and had a right sided occipital nerve block. Later that month, he developed gradual fatigue, weakness and slurred speech. He went to the ED on 03/28/2015. Blood glucose was in the 400s. He had reportedly run out of insulin. He had a non-focal exam. His symptoms were thought to be due to hyperglycemia. In early 2017, the patient demonstrated  a decline in cognition, had approximately four falls, appeared more agitated and was more disoriented after naps. It was noted that he had a past history of confusion related to hyperglycemia with sugars in the 700s. However, the patient reported that he had been keeping his sugars no higher than 130s-140s. He also had an increase in headaches. His family reported that he had been having mild memory problems for at least a year (exact onset unknown), with occasional disorientation on familiar routes when driving, and difficulty managing bills. MMSE on 01/15/2016 was 23/30. Dr. [name] suspected he may have an underlying neurodegenerative disease/dementia. MRI of the brain completed on 03/10/2016 revealed no acute finding or explanation for the history, but progression of mild atrophy and mild chronic microvascular ischemic change since 2010 was noted. Labwork revealed very low vitamin D and he was started on oral supplement. From February - April 2017, he reportedly had episodes of suddenly feeling lightheaded, diaphoretic with palpitations and then tunnel vision, followed by passing out. Thre were no convulsions, bladder incontinence or tongue biting. Upon waking up he would be very confused with slurred speech from 30-60 minutes. He was admitted to the hospital twice after episodes. He demonstrated severe orthostatic hypotension. Routine EEGs and long term EEG monitoring for 24 hours were all normal (although no event was captured). Dr. [name] considered whether memory deficits may be related to recurrent cerebral hypoperfusion. Repeat 72 hour ambulatory EEG from 5/1-04/08/2016 was normal. Cardiac workup revealed NSR with occasional PVCs, as well  as atrial fibrillation and flutter. He was started on Coumadin in addition to Plavix. He continued to have episodes of syncope and falls. He underwent anterior cervical decompression and fusion in 05/2016. In January 2018, his wife observed seizure like activity (shaking) while he  was sleeping. He had urinary incontinence but did not bite his tongue. EMS was called and he was taken to the ED where CT of the head was unremarkable. [name] [name] was last seen by Dr. [name] on 01/21/2017. He reported that headaches had increased and that he sometimes demonstrates shuffling gait and had tremor in his hands, especially on the left. He sometimes had difficulty initiating walking upon standing up. Per Dr. [name]: "Cognitive impairment may be a neurodegenerative disorder or possibly related to cerebral hypoperfusion from recurrent syncope. He and his wife endorse shuffling gait, tremor, occasional double vision and freezing. In conjunction with his dysautonomia, one might consider multiple system atrophy. However, I don't appreciate any parkinsonism or gaze paresis on exam." Depakote was started to address headaches as well as seizure.   At today's visit, [name] [name] is accompanied by his wife but she notes that she cannot answer some questions because she herself has memory impairment from a prior stroke. She is not sure when her husband's memory issues began, but she thinks it was after his concussion in 2016. She reported that he is frequently disoriented upon waking up and may ask where he is. He also has had some episodes of getting lost when driving. She reported that he forgets recent conversations, repeats questions/statements and misplaces items more than he used to. He sometimes does not remember recent events. He endorses slowed information processing speed. They do not think he is having more trouble with attention/concentration. He endorses word finding difficulty. He finds written tasks like filling out paperwork or managing the bills much more confusing and he is prone to making errors, so his wife does all of this now.   His wife is also concerned about his frequent dozing off during the day without meaning to. This most frequently happens when he is watching television and not  in conversation with anyone. He also has fallen asleep in the middle of searching channels on the TV remote and while holding a cup of coffee. She reports he falls into a very deep sleep very quickly. He does have sleep apnea and wears his CPAP nightly. He does take it off some nights. He has always been able to fall asleep pretty easily but it was not like this before. They are unsure when this started happening.  The patient and his wife also reported frequent stumbling, as well as continued headache and neck pain. He has not had any falls recently.   There is no known family history of dementia or neurodegenerative disease.   Current Functioning: Mr. Seeberger lives in a private residence with his wife, daughter and 69yo grandson. As noted previously, he is having difficulty managing many instrumental ADLs including driving and finances. His wife took over management of his medications as well. She also manages his appointments. He does all the housecleaning, laundry and dishes. He can cook simple items without any difficulty.  With regard to mood, his wife reports that he is more irritable. 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 He enjoyed that very much. He does still enjoy spending time with his 65 yo grandson who lives in the same home.  This keeps him active. He is not allowed to watch his grandson independently, however, as the family worries about his confusion and his tendency to fall asleep unintentionally.  The patient denied depressed mood or significant sadness on a regular basis. He endorsed anxiety characterized by frequent worrying about his family. His appetite is reduced. He does not eat as much and he has lost a fair amount of weight unintentionally.   Of note, the patient endorses auditory hallucinations. He frequently hears his deceased relatives (mother, father, brother) speaking in his  house. Sometimes they speak to him. He denies any command hallucinations. He does not find it disturbing or upsetting. He does not have visual hallucinations.  He denied past or present suicidal ideation or intention.   Psychiatric History: Mr. Hodapp was diagnosed with bipolar disorder approximately 25 years ago. All four of his daughters have also been diagnosed with bipolar disorder. The patient and his wife cannot recall what initially led to the diagnosis. Upon questioning, it does not seem he has a history of mania. He does have a history of depression and he takes sertraline for this.  His wife also reports that he "got hateful" after his bypass surgery in 2000. She stated that they were told by a doctor that this is very common after coronary bypass surgery. He was put on sertraline at that time. He has seen a psychiatrist in the past.  He denied history of SI/HI. He denied history of substance abuse/dependence.   Social History: Born/Raised: [place] Education: High school Occupational history: The patient worked in [employer] up until he had his bypass surgery. He endorsed chemical exposure in his work. He went on disability after that and did not work again. He is retired. Marital history: Married x35 years. 4 daughters, 7 grandchildren. Alcohol/Tobacco/Substances: Not a regular drinker (1 drink every few years). Never a smoker. No SA.   Medical History:  Past Medical History:  Diagnosis Date  . ACP (advance care planning) 05/11/2015  . Advance care planning 05/01/2015  . Anemia    hemoglobin 7.4, iron deficiency, January, 2011, 2 unit transfusion, endoscopy normal, capsule endoscopy February, 2011 no small bowel abnormalities.   Most likely source gastric erosions, followed by GI  . Anxiety   . CAD (coronary artery disease)    A. CABG in 2000,status post cardiac cath in 2006, 2009 ....continued chest pain and SOB despite oral medication adjestments including Ranexa.  B. Cath November 2009/ mRCA - 2.75 x 23 Abbott Xience V drug-eluting stent ...11/26/2008 to distal  RCA leading to acute marginal.  C. Cath 07/2012 for CP - stable anatomy, med rx. d. cath 2015 and 05/30/2015 stable anatomy, consider Myoview if has CP again  . Cerebral ischemia    MRI November, 2010, chronic microvascular ischemia  . Chronic diastolic CHF (congestive heart failure) ([place]) 03/13/2016  . Depression    Bipolar  . Diabetes mellitus type II, uncontrolled ([place])   . Diabetes mellitus without complication ([place])   . Diarrhea 02/15/2016  . Dizziness   . Edema   . Ejection fraction    EF 60%, echo, July 31, 2012  . Falling episodes    these have occurred in the past and again recurring 2011  . Family history of adverse reaction to anesthesia    "mother died during bypass surgery but not sure if it has to do with anesthesia"  . Gastric ulcer   . GERD (gastroesophageal reflux disease)   . H/O medication noncompliance  Due to loss of insurance  . Hard of hearing   . Headache 01/25/2016  . Hx of CABG    2000,  / one median sternotomy suture broken her chest x-ray November, 2010, no clinical significance  . Hyperlipidemia   . Hypertension    pt. denies  . Iron deficiency anemia   . Low back pain 06/12/2009   Qualifier: Diagnosis of  By: [name]   . Nausea with vomiting 01/25/2016  . Nephrolithiasis   . OSA (obstructive sleep apnea)   . Pain in limb 06/12/2009   Qualifier: Diagnosis of  By: [name]   . Palpitations    event recorder showed sinus rhythm  . RBBB (right bundle branch block)   . Restless leg   . Right knee pain 01/07/2015  . Shortness of breath    CPX April, 2011, mild functional limitation, no clear pulmonary or cardiac limitation, possible deconditioning and mild chronotropic incompetence( peak heart rate 130)  . Spondylosis    C5-6, C6-7 MRI 2010  . Syncope 03/2016  . Thyroid disease   . Tubulovillous adenoma of colon 2007  . Wears glasses      Current medications:  Outpatient Encounter Prescriptions as of 03/14/2017  Medication Sig  . albuterol (PROVENTIL HFA;VENTOLIN HFA) 108 (90 Base) MCG/ACT inhaler Inhale 2 puffs into the lungs every 6 (six) hours as needed for wheezing or shortness of breath.  Marland Kitchen amiodarone (PACERONE) 400 MG tablet Take 1 tablet (400 mg total) by mouth daily.  Marland Kitchen atorvastatin (LIPITOR) 40 MG tablet Take 1.5 tablets (60 mg total) by mouth daily.  . diclofenac sodium (VOLTAREN) 1 % GEL Apply 2 g topically daily as needed (for pain in affected area).  . divalproex (DEPAKOTE ER) 500 MG 24 hr tablet Take 1 tablet (500 mg total) by mouth daily.  Marland Kitchen gabapentin (NEURONTIN) 300 MG capsule Take 2 capsules (600 mg) by mouth  every morning, 1 capsule (300 mg) by mouth  at noon, and 2 capsules (600 mg) by mouth at night  . glucose blood (ACCU-CHEK AVIVA) test strip Use as directed to check blood sugar 4 times daily.  DX E11.9  . insulin aspart (NOVOLOG) 100 UNIT/ML injection Inject 10-17 Units into the skin 3 (three) times daily with meals. Use 10-17 unit 3 times daily before meals  . Insulin Glargine (TOUJEO SOLOSTAR) 300 UNIT/ML SOPN Inject 42 Units into the skin at bedtime. (Patient taking differently: Inject 48 Units into the skin at bedtime. )  . insulin NPH Human (HUMULIN N,NOVOLIN N) 100 UNIT/ML injection Inject 10-20 Units Twice Daily as instructed  . isosorbide mononitrate (IMDUR) 60 MG 24 hr tablet TAKE TWO TABLETS BY MOUTH DAILY  . levothyroxine (SYNTHROID, LEVOTHROID) 88 MCG tablet Take 1 tablet (88 mcg total) by mouth daily before breakfast.  . loperamide (IMODIUM A-D) 2 MG tablet Take 1 tablet (2 mg total) by mouth 4 (four) times daily as needed for diarrhea or loose stools.  . meclizine (ANTIVERT) 12.5 MG tablet Take 1 tablet (12.5 mg total) by mouth 3 (three) times daily as needed for dizziness.  . metFORMIN (GLUCOPHAGE) 1000 MG tablet TAKE 1 TABLET (1,000 MG TOTAL) BY MOUTH 2 (TWO) TIMES DAILY WITH A MEAL.  .  metoprolol succinate (TOPROL-XL) 50 MG 24 hr tablet Take 1 and a half tablets once a day for a total of 75 mg. (Patient taking differently: Take 75 mg by mouth daily. )  . midodrine (PROAMATINE) 10 MG tablet Take 1  tablet (10 mg total) by mouth 3 (three) times daily.  . nitroGLYCERIN (NITROSTAT) 0.4 MG SL tablet Place 0.4 mg under the tongue every 5 (five) minutes as needed for chest pain.  Marland Kitchen ondansetron (ZOFRAN ODT) 4 MG disintegrating tablet Take 1 tablet (4 mg total) by mouth every 8 (eight) hours as needed for nausea or vomiting.  . pantoprazole (PROTONIX) 40 MG tablet TAKE 1 TABLET BY MOUTH TWICE DAILY FOR 2 WEEKS THEN DROP TO ONCE DAILY  . pramipexole (MIRAPEX) 1 MG tablet TAKE 1/2 TO 1 TABLET BY MOUTH AT BEDTIME  . sertraline (ZOLOFT) 100 MG tablet Take 1 tablet (100 mg total) by mouth 2 (two) times daily.  Marland Kitchen tiZANidine (ZANAFLEX) 4 MG tablet Take 1 tablet (4 mg total) by mouth every 8 (eight) hours as needed for muscle spasms.  Marland Kitchen topiramate (TOPAMAX) 50 MG tablet TAKE 1 TABLET (50 MG) BY MOUTH AT BEDTIME.  Marland Kitchen warfarin (COUMADIN) 5 MG tablet Take as directed by [facility] (Patient taking differently: Take 5-7.5 mg by mouth See admin instructions. 5 mg daily; except on Friday;  patient take 7.5 mg)   No facility-administered encounter medications on file as of 03/14/2017.      Current Examination:  Behavioral Observations:   Appearance: Neatly and appropriately dressed and groomed Gait: Ambulated independently, no gross abnormalities observed Speech: Fluent; normal rate, rhythm and volume Thought process: Linear, goal directed Affect: Blunted Interpersonal: Pleasant, appropriate, engaged Orientation: Oriented to all spheres. Accurately named the current President and his predecessor.  Tests Administered: . Test of Premorbid Functioning (TOPF) . Wechsler Adult Intelligence Scale-Fourth Edition (WAIS-IV): Similarities, Music therapist, Coding and Digit Span subtests . Wechsler Memory  Scale-Fourth Edition (WMS-IV) Older Adult Version (ages 61-90): Logical Memory I, II and Recognition subtests  . [employer] Verbal Learning Test - 2nd Edition (CVLT-2) Short Form . Repeatable Battery for the Assessment of Neuropsychological Status (RBANS) Form A:  Figure Copy and Recall subtests and Semantic Fluency subtest . Neuropsychological Assessment Battery (NAB) Language Module, Form 1: Naming subtest . [place] Diagnostic Aphasia Examination: Complex Ideational Material subtest . Controlled Oral Word Association Test (COWAT) . Trail Making Test A and B . Clock drawing test . Geriatric Depression Scale (GDS) 15 Item . Generalized Anxiety Disorder - 7 item screener (GAD-7)  Test Results: Note: Standardized scores are presented only for use by appropriately trained professionals and to allow for any future test-retest comparison. These scores should not be interpreted without consideration of all the information that is contained in the rest of the report. The most recent standardization samples from the test publisher or other sources were used whenever possible to derive standard scores; scores were corrected for age, gender, ethnicity and education when available.   Test Scores:  Test Name Raw Score Standardized Score Descriptor  TOPF 11/70 SS= 71 Borderline  WAIS-IV Subtests     Similarities 23/36 ss= 9 Average  Block Design 24/66 ss= 8 Average  Coding 45/135 ss= 8 Average  Digit Span Forward 8/16 ss= 8 Average  Digit Span Backward 6/16 ss= 8 Average  WMS-IV Subtests     LM I 33/53 ss= 10 Average  LM II 21/39 ss= 11 Average  LM II Recognition 21/23 Cum %: >75 Above average  RBANS Subtests     Figure Copy 17/20 Z= -0.7 Average  Figure Recall 10/20 Z= -0.9 Low average  Semantic Fluency 10 Z= -2.4 Impaired  CVLT-II Scores     Trial 1 3/9 Z= -3 Severely impaired  Trial 4  8/9 Z= 0.5 Average  Trials 1-4 total 24/36 T= 46 Average  SD Free Recall 7/9 Z= 0 Average  LD Free Recall  8/9 Z= 1.5 Superior  LD Cued Recall 7/9 Z= 0.5 Average  Recognition Discriminability 9/9 hits, 0 false positives Z= 1 High average  Forced Choice Recognition 9/9  WNL  NAB Naming 31/31 T= 57 High average  BDAE Complex Ideational Material 11/12  WNL  COWAT-FAS 12 T= 31 Borderline  COWAT-Animals 13 T= 42 Low average  Trail Making Test A  49" 0 errors T= 39 Low average  Trail Making Test B  140" 2 errors T= 35 Borderline  Clock Drawing   WNL  GDS-15 2/15  WNL   GAD-7 9/21  Mild       Description of Test Results:  Premorbid verbal intellectual abilities were estimated to have been within the borderline range based on a test of word reading. Psychomotor processing speed was low average to average. Auditory attention and working memory were average. Visual-spatial construction was average. Language abilities were variable. Specifically, confrontation naming was high average with 100% accuracy, while semantic verbal fluency ranged from low average to impaired. Auditory comprehension of complex ideational material was within normal limits. With regard to verbal memory, encoding and acquisition of non-contextual information (i.e., word list) was severely impaired on the first learning trial but greatly improved across the following three learning trials and was average on the fourth trial (8/9 words recalled). After a brief distracter task, free recall was average (7/9 words recalled). After a delay, free recall was superior (8/9 words recalled). He did not benefit from semantic cueing. Performance on a yes/no recognition task was high average with 100% accuracy. On another verbal memory test, encoding and acquisition of contextual auditory information (i.e., short story) was average. After a delay, free recall was average. Performance on a yes/no recognition task was above average. With regard to non-verbal memory, delayed free recall of visual information was low average. Executive functioning was  somewhat variable. Mental flexibility and set-shifting were borderline impaired on Trails B, and he committed two set loss errors on the task. Verbal fluency with phonemic search restrictions was borderline impaired. Verbal abstract reasoning was average. Performance on a clock drawing task was intact. On a self-report measure of mood, the patient's responses were not  indicative of clinically significant depression at the present time. He did endorse a mild level of anxiety on a self-report measure of generalized anxiety symptoms. Symptoms endorsed included: restlessness, difficulty relaxing, nervousness, worrying about multiple issues, inability to control worrying, irritability.   Clinical Impressions: Non-amnestic Mild Cognitive Impairment; bipolar disorder (by history); mild anxiety. Results of cognitive testing revealed mostly normal performances but there was evidence of mild executive dysfunction. His testing profile does not indicate dementia, but a diagnosis of mild cognitive impairment is warranted. I do suspect he may experience fluctuations in cognitive abilities, with days/times much worse than what is reflected on the current evaluation.  The patient's cognitive profile is reflective of mild frontal-subcortical dysfunction. This could certainly be related to repeated cerebral hypoprofusion and history of poorly controlled diabetes with multiple hyperglycemic episodes. There is no indication from this evaluation of a memory disorder or underlying Alzheimer's disease. Based on the typical trajectory of symptoms/recovery after a mild TBI, I do not suspect his concussion(s) two years ago are responsible for his cognitive decline although they may have exacerbated underlying dysfunction to some extent. The possibility of a parkinsonian syndrome such as multiple system atrophy  has been discussed, given the reported changes in gait, frequent falls and dysautonomia. His cognitive profile on testing  (mild executive dysfunction) is consistent with what is typically seen in [place]. His extreme fluctuations in alertness/arousal, as well as hallucinations, also seem concerning to me for Lewy body dementia; however, he did not demonstrate visual-spatial dysfunction on testing which is common in LBD, and his hallucinations are auditory instead of the more commonly experienced visual hallucinations in LBD. In considering all the differentials, and the available information at this time, it is my clinical opinion that cerebral hypoperfusion and uncontrolled diabetes are playing a large role in cognitive impairment. While the patient has a history of diagnosed bipolar disorder, he has been stable psychiatrically for many years without a mood stabilizer (takes sertraline). He does report increased anxiety and restlessness, but this seems more related to his lack of ability to engage in activities he used to enjoy than to primary psychiatric disturbance.     Recommendations/Plan: Based on the findings of the present evaluation, the following recommendations are offered:Hi  1. Continue CPAP: The patient was positively reinforced for regular use of his CPAP. I explained how this helps reduce risk of cognitive decline. 2. Strict control of vascular risk factors is essential. I educated the patient regarding the impact of rapid changes in blood glucose levels, as well as poorly controlled diabetes in general, on cognitive functioning. His wife notes that it is not uncommon for his blood glucose to be in the 400s. 3. Neuropsychological re-evaluation in one year is recommended in order to monitor cognitive status, track any progression of symptoms and further assist with treatment planning. 4. We discussed the fact that the patient's inability to participate in baseball games is contributing significantly to his depression and low level of subjective well-being. Validation and support were provided. I encouraged him to  consider ways he can continue to be involved in baseball that don't put him at risk of injury (e.g., watching live games, assisting with coaching).   Feedback to Patient: ROBT OKUDA and his wife returned for a feedback appointment on 03/14/2017 to review the results of his neuropsychological evaluation with this provider. 30 minutes face-to-face time was spent reviewing his test results, my impressions and my recommendations as detailed above.    Total time spent on this patient's case: [id number] unit for interview with psychologist; [phone] units of testing by psychometrician under psychologist's supervision; [phone] units for medical record review, scoring of neuropsychological tests, interpretation of test results, preparation of this report, and review of results to the patient by psychologist.      Thank you for your referral of [facility]. Please feel free to contact me if you have any questions or concerns regarding this report.

## 2017-03-14 ENCOUNTER — Ambulatory Visit (INDEPENDENT_AMBULATORY_CARE_PROVIDER_SITE_OTHER): Payer: Medicare PPO | Admitting: *Deleted

## 2017-03-14 ENCOUNTER — Encounter: Payer: Self-pay | Admitting: Psychology

## 2017-03-14 ENCOUNTER — Ambulatory Visit (INDEPENDENT_AMBULATORY_CARE_PROVIDER_SITE_OTHER): Payer: Medicare PPO | Admitting: Psychology

## 2017-03-14 ENCOUNTER — Other Ambulatory Visit: Payer: Self-pay | Admitting: Family Medicine

## 2017-03-14 DIAGNOSIS — Z5181 Encounter for therapeutic drug level monitoring: Secondary | ICD-10-CM | POA: Diagnosis not present

## 2017-03-14 DIAGNOSIS — I480 Paroxysmal atrial fibrillation: Secondary | ICD-10-CM | POA: Diagnosis not present

## 2017-03-14 DIAGNOSIS — G3184 Mild cognitive impairment, so stated: Secondary | ICD-10-CM

## 2017-03-14 DIAGNOSIS — S060X1S Concussion with loss of consciousness of 30 minutes or less, sequela: Secondary | ICD-10-CM

## 2017-03-14 DIAGNOSIS — I4891 Unspecified atrial fibrillation: Secondary | ICD-10-CM

## 2017-03-14 LAB — POCT INR: INR: 2.1

## 2017-03-14 MED ORDER — VITAMIN D (ERGOCALCIFEROL) 1.25 MG (50000 UNIT) PO CAPS: 50000.0000 [IU] | ORAL_CAPSULE | ORAL | 2 refills | Status: DC

## 2017-03-14 MED FILL — VIT D2 1.25 MG (50,000 UNIT: 1.25 MG | 84 days supply | Qty: 12 | Fill #0

## 2017-03-17 ENCOUNTER — Encounter: Payer: Self-pay | Admitting: *Deleted

## 2017-03-17 ENCOUNTER — Ambulatory Visit (INDEPENDENT_AMBULATORY_CARE_PROVIDER_SITE_OTHER): Payer: Medicare PPO | Admitting: *Deleted

## 2017-03-17 ENCOUNTER — Ambulatory Visit: Payer: Medicare PPO | Admitting: *Deleted

## 2017-03-17 ENCOUNTER — Other Ambulatory Visit (HOSPITAL_COMMUNITY): Payer: Self-pay | Admitting: Interventional Cardiology

## 2017-03-17 VITALS — BP 130/66 | HR 53 | Ht 69.0 in | Wt 168.2 lb

## 2017-03-17 DIAGNOSIS — Z1159 Encounter for screening for other viral diseases: Secondary | ICD-10-CM | POA: Diagnosis not present

## 2017-03-17 DIAGNOSIS — Z0000 Encounter for general adult medical examination without abnormal findings: Secondary | ICD-10-CM

## 2017-03-17 MED FILL — WARFARIN SODIUM 5 MG TABLET: 5 | 30 days supply | Qty: 35 | Fill #0

## 2017-03-17 NOTE — Progress Notes (Signed)
Subjective:   [name] [name] is a 71 y.o. male who presents for Medicare Annual/Subsequent preventive examination.  Checking CBG at home, averaging 190s-200s. He has had 1-2 episodes of hypoglycemia to the 40s-50s at night in the last month. These have resolved immediately with oral intake.  Review of Systems:  No ROS.  Medicare Wellness Visit.  Cardiac Risk Factors include: advanced age (>66men, >49 women);diabetes mellitus;dyslipidemia;male gender;hypertension  Sleep patterns: no sleep issues and gets up 1 times nightly to void. Wears CPAP every night for about 'as long as I can stand it.' He is sleeping better since starting CPAP. Home Safety/Smoke Alarms: Feels safe in home. Smoke alarms in place.  Living environment; residence and Firearm Safety: Lives w/ wife, their 2 daughters, grandson, and daughter's friend and her son. 1-story house/ trailer, no firearms. Seat Belt Safety/Bike Helmet: Wears seat belt.   Counseling:   Eye Exam- due for appointment. Follows w/ Dr. Katy Fitch yearly. Dental- Does not follow w/ dentist regularly.   Male:   CCS- last 02/10/09. Internal hemorrhoids, otherwise normal. 10 year recall.    PSA-  Lab Results  Component Value Date   PSA 2.39 11/25/2010   PSA 1.48 04/05/2008   PSA 1.60 03/14/2007       Objective:    Vitals: BP 130/66   Pulse (!) 53   Ht 5\' 9"  (1.753 m)   Wt 168 lb 3.2 oz (76.3 kg)   SpO2 98%   BMI 24.84 kg/m   Body mass index is 24.84 kg/m.  Tobacco History  Smoking Status  . Never Smoker  Smokeless Tobacco  . Never Used     Counseling given: Not Answered   Past Medical History:  Diagnosis Date  . ACP (advance care planning) 05/11/2015  . Advance care planning 05/01/2015  . Anemia    hemoglobin 7.4, iron deficiency, January, 2011, 2 unit transfusion, endoscopy normal, capsule endoscopy February, 2011 no small bowel abnormalities.   Most likely source gastric erosions, followed by GI  . Anxiety   . CAD (coronary  artery disease)    A. CABG in 2000,status post cardiac cath in 2006, 2009 ....continued chest pain and SOB despite oral medication adjestments including Ranexa. B. Cath November 2009/ mRCA - 2.75 x 23 Abbott Xience V drug-eluting stent ...11/26/2008 to distal  RCA leading to acute marginal.  C. Cath 07/2012 for CP - stable anatomy, med rx. d. cath 2015 and 05/30/2015 stable anatomy, consider Myoview if has CP again  . Cerebral ischemia    MRI November, 2010, chronic microvascular ischemia  . Chronic diastolic CHF (congestive heart failure) ([place]) 03/13/2016  . Depression    Bipolar  . Diabetes mellitus type II, uncontrolled ([place])   . Diabetes mellitus without complication ([place])   . Diarrhea 02/15/2016  . Dizziness   . Edema   . Ejection fraction    EF 60%, echo, July 31, 2012  . Falling episodes    these have occurred in the past and again recurring 2011  . Family history of adverse reaction to anesthesia    "mother died during bypass surgery but not sure if it has to do with anesthesia"  . Gastric ulcer   . GERD (gastroesophageal reflux disease)   . H/O medication noncompliance    Due to loss of insurance  . Hard of hearing   . Headache 01/25/2016  . Hx of CABG    2000,  / one median sternotomy suture broken her chest x-ray November, 2010, no clinical  significance  . Hyperlipidemia   . Hypertension    pt. denies  . Iron deficiency anemia   . Low back pain 06/12/2009   Qualifier: Diagnosis of  By: [name]   . Nausea with vomiting 01/25/2016  . Nephrolithiasis   . OSA (obstructive sleep apnea)   . Pain in limb 06/12/2009   Qualifier: Diagnosis of  By: [name]   . Palpitations    event recorder showed sinus rhythm  . RBBB (right bundle branch block)   . Restless leg   . Right knee pain 01/07/2015  . Shortness of breath    CPX April, 2011, mild functional limitation, no clear pulmonary or cardiac limitation, possible deconditioning and mild chronotropic incompetence(  peak heart rate 130)  . Spondylosis    C5-6, C6-7 MRI 2010  . Syncope 03/2016  . Thyroid disease   . Tubulovillous adenoma of colon 2007  . Wears glasses    Past Surgical History:  Procedure Laterality Date  . ANTERIOR CERVICAL DECOMP/DISCECTOMY FUSION N/A 05/31/2016   Procedure: ANTERIOR CERVICAL DECOMPRESSION/DISCECTOMY FUSION CERVICAL FIVE-SIX,CERVICAL SIX-SEVEN;  Surgeon: [name], MD;  Location: [facility];  Service: Neurosurgery;  Laterality: N/A;  . CARDIAC CATHETERIZATION N/A 05/30/2015   Procedure: Left Heart Cath and Coronary Angiography;  Surgeon: [name], MD;  Location: [facility];  Service: Cardiovascular;  Laterality: N/A;  . COLONOSCOPY    . CORONARY ARTERY BYPASS GRAFT     2000  . ESOPHAGOGASTRODUODENOSCOPY    . LEFT HEART CATHETERIZATION WITH CORONARY/GRAFT ANGIOGRAM N/A 08/01/2012   Procedure: LEFT HEART CATHETERIZATION WITH Beatrix Fetters;  Surgeon: [name], MD;  Location: [facility] CATH LAB;  Service: Cardiovascular;  Laterality: N/A;  . LEFT HEART CATHETERIZATION WITH CORONARY/GRAFT ANGIOGRAM N/A 01/03/2015   Procedure: LEFT HEART CATHETERIZATION WITH Beatrix Fetters;  Surgeon: [name], MD;  Location: [facility] CATH LAB;  Service: Cardiovascular;  Laterality: N/A;  . NASAL SEPTUM SURGERY     UP3  . PERCUTANEOUS CORONARY STENT INTERVENTION (PCI-S)  10/2008   mRCA PCI  2.75 x 23 Abbott Xience V drug-eluting stent    Family History  Problem Relation Age of Onset  . Pancreatic cancer Brother   . Diabetes Brother   . Coronary artery disease Brother   . Stroke Brother   . Diabetes Brother   . Diabetes Mother   . Heart failure Mother   . Heart failure Father   . Hypothyroidism Brother   . Coronary artery disease Brother   . Other Brother     colon surgery  . Heart attack      Nephew  . Irregular heart beat Daughter   . Cancer Maternal Grandmother     unknown    History  Sexual Activity  . Sexual activity: Yes  .  Partners: Female    Outpatient Encounter Prescriptions as of 03/17/2017  Medication Sig  . albuterol (PROVENTIL HFA;VENTOLIN HFA) 108 (90 Base) MCG/ACT inhaler Inhale 2 puffs into the lungs every 6 (six) hours as needed for wheezing or shortness of breath.  Marland Kitchen amiodarone (PACERONE) 400 MG tablet Take 1 tablet (400 mg total) by mouth daily.  Marland Kitchen atorvastatin (LIPITOR) 40 MG tablet Take 1.5 tablets (60 mg total) by mouth daily.  . diclofenac sodium (VOLTAREN) 1 % GEL Apply 2 g topically daily as needed (for pain in affected area).  . divalproex (DEPAKOTE ER) 500 MG 24 hr tablet Take 1 tablet (500 mg total) by mouth daily.  Marland Kitchen  gabapentin (NEURONTIN) 300 MG capsule Take 2 capsules (600 mg) by mouth  every morning, 1 capsule (300 mg) by mouth  at noon, and 2 capsules (600 mg) by mouth at night  . glucose blood (ACCU-CHEK AVIVA) test strip Use as directed to check blood sugar 4 times daily.  DX E11.9  . insulin aspart (NOVOLOG) 100 UNIT/ML injection Inject 10-17 Units into the skin 3 (three) times daily with meals. Use 10-17 unit 3 times daily before meals  . Insulin Glargine (TOUJEO SOLOSTAR) 300 UNIT/ML SOPN Inject 42 Units into the skin at bedtime. (Patient taking differently: Inject 48 Units into the skin at bedtime. )  . isosorbide mononitrate (IMDUR) 60 MG 24 hr tablet TAKE TWO TABLETS BY MOUTH DAILY  . levothyroxine (SYNTHROID, LEVOTHROID) 88 MCG tablet Take 1 tablet (88 mcg total) by mouth daily before breakfast.  . metFORMIN (GLUCOPHAGE) 1000 MG tablet TAKE 1 TABLET (1,000 MG TOTAL) BY MOUTH 2 (TWO) TIMES DAILY WITH A MEAL.  . metoprolol succinate (TOPROL-XL) 50 MG 24 hr tablet Take 1 and a half tablets once a day for a total of 75 mg. (Patient taking differently: Take 75 mg by mouth daily. )  . midodrine (PROAMATINE) 10 MG tablet Take 1 tablet (10 mg total) by mouth 3 (three) times daily.  . nitroGLYCERIN (NITROSTAT) 0.4 MG SL tablet Place 0.4 mg under the tongue every 5 (five) minutes as needed  for chest pain.  Marland Kitchen ondansetron (ZOFRAN ODT) 4 MG disintegrating tablet Take 1 tablet (4 mg total) by mouth every 8 (eight) hours as needed for nausea or vomiting.  . pantoprazole (PROTONIX) 40 MG tablet TAKE 1 TABLET BY MOUTH TWICE DAILY FOR 2 WEEKS THEN DROP TO ONCE DAILY  . pramipexole (MIRAPEX) 1 MG tablet TAKE 1/2 TO 1 TABLET BY MOUTH AT BEDTIME  . sertraline (ZOLOFT) 100 MG tablet Take 1 tablet (100 mg total) by mouth 2 (two) times daily.  Marland Kitchen tiZANidine (ZANAFLEX) 4 MG tablet Take 1 tablet (4 mg total) by mouth every 8 (eight) hours as needed for muscle spasms.  Marland Kitchen topiramate (TOPAMAX) 50 MG tablet TAKE 1 TABLET (50 MG) BY MOUTH AT BEDTIME.  Marland Kitchen Vitamin D, Ergocalciferol, (DRISDOL) 50000 units CAPS capsule Take 1 capsule (50,000 Units total) by mouth every 7 (seven) days.  Marland Kitchen warfarin (COUMADIN) 5 MG tablet Take as directed by [facility] (Patient taking differently: Take 5-7.5 mg by mouth See admin instructions. 5 mg daily; except on Friday;  patient take 7.5 mg)  . insulin NPH Human (HUMULIN N,NOVOLIN N) 100 UNIT/ML injection Inject 10-20 Units Twice Daily as instructed (Patient not taking: Reported on 03/17/2017)  . loperamide (IMODIUM A-D) 2 MG tablet Take 1 tablet (2 mg total) by mouth 4 (four) times daily as needed for diarrhea or loose stools. (Patient not taking: Reported on 03/17/2017)  . meclizine (ANTIVERT) 12.5 MG tablet Take 1 tablet (12.5 mg total) by mouth 3 (three) times daily as needed for dizziness. (Patient not taking: Reported on 03/17/2017)   No facility-administered encounter medications on file as of 03/17/2017.     Activities of Daily Living In your present state of health, do you have any difficulty performing the following activities: 03/17/2017 12/30/2016  Hearing? N N  Vision? N N  Difficulty concentrating or making decisions? Tempie Donning  Walking or climbing stairs? N N  Dressing or bathing? N N  Doing errands, shopping? N N  Preparing Food and eating ? N -  Using the  Toilet? N -  In the past six months, have you accidently leaked urine? N -  Do you have problems with loss of bowel control? N -  Managing your Medications? Y -  Managing your Finances? Y -  Housekeeping or managing your Housekeeping? N -  Some recent data might be hidden    Patient Care Team: [name], MD as PCP - General (Family Medicine) Pieter Partridge, DO as Consulting Physician (Neurology) [name], MD as Consulting Physician (Cardiology) Deboraha Sprang, MD as Consulting Physician (Cardiology) Rush Landmark, MD as Consulting Physician (Pulmonary Disease) Warden Fillers, MD as Consulting Physician (Ophthalmology)   Assessment:    Physical assessment deferred to PCP.  Exercise Activities and Dietary recommendations Current Exercise Habits: Home exercise routine, Type of exercise: walking, Frequency (Times/Week): 7  Diet (meal preparation, eat out, water intake, caffeinated beverages, dairy products, fruits and vegetables): in general, an "unhealthy" diet, on average, 2 meals per day. "I just eat what I want. I binge every once and a while on cookies." Drinks water, diet Pepsi, and unsweet tea. Eats out frequently, regular diet.   Goals    . Go fishing more      Fall Risk Fall Risk  03/17/2017 01/21/2017 12/30/2016 07/12/2016 01/15/2016  Falls in the past year? Yes Yes No Yes Yes  Number falls in past yr: 2 or more 2 or more - 2 or more 2 or more  Injury with Fall? No No - No No  Risk Factor Category  - - - High Fall Risk High Fall Risk  Risk for fall due to : Impaired balance/gait;Mental status change - - - -  Follow up - - - Falls evaluation completed Falls evaluation completed   Depression Screen PHQ 2/9 Scores 03/17/2017 12/30/2016 01/07/2015  PHQ - 2 Score 1 0 0    Cognitive Function MMSE - Mini Mental State Exam 01/21/2017 01/15/2016 02/18/2015  Orientation to time 4 5 5   Orientation to Place 5 4 5   Registration 3 3 3   Attention/ Calculation 0 0 2    Recall 3 2 3   Language- name 2 objects 2 2 2   Language- repeat 1 1 1   Language- follow 3 step command 3 3 3   Language- read & follow direction 1 1 1   Write a sentence 1 1 1   Copy design 1 1 1   Total score 24 23 27         Immunization History  Administered Date(s) Administered  . Influenza Split 09/21/2012  . Influenza Whole 10/16/2007, 10/01/2008, 09/23/2009, 09/22/2010  . Influenza,inj,Quad PF,36+ Mos 08/22/2015, 08/22/2015, 08/24/2016  . Influenza-Unspecified 09/04/2014  . Pneumococcal Conjugate-13 08/24/2016  . Pneumococcal Polysaccharide-23 12/18/2008, 01/03/2015  . Td 12/18/2007  . Zoster 11/25/2010, 11/30/2012   Screening Tests Health Maintenance  Topic Date Due  . Hepatitis C Screening  04/07/1946  . OPHTHALMOLOGY EXAM  06/20/2012  . URINE MICROALBUMIN  04/30/2016  . INFLUENZA VACCINE  07/06/2017  . HEMOGLOBIN A1C  07/21/2017  . TETANUS/TDAP  12/17/2017  . FOOT EXAM  03/17/2018  . COLONOSCOPY  02/11/2019  . PNA vac Low Risk Adult  Completed      Plan:   Follow-up w/ PCP as scheduled.  Dental resource list provided.  Create and bring a copy of your advance directives to your next office visit.  Schedule diabetic eye exam.  Hep C screening w/ next labs. Future orders placed.  During the course of the visit the patient was educated and counseled about the following  appropriate screening and preventive services:   Vaccines to include Pneumococcal, Influenza, Td, HCV  Cardiovascular Disease  Colorectal cancer screening  Diabetes screening  Prostate Cancer Screening  Glaucoma screening  Nutrition counseling   Patient Instructions (the written plan) was given to the patient.    Dorrene German, RN  03/17/2017

## 2017-03-17 NOTE — Progress Notes (Signed)
Pre visit review using our clinic review tool, if applicable. No additional management support is needed unless otherwise documented below in the visit note. 

## 2017-03-17 NOTE — Patient Instructions (Addendum)
[name] [name] , Thank you for taking time to come for your Medicare Wellness Visit. I appreciate your ongoing commitment to your health goals. Please review the following plan we discussed and let me know if I can assist you in the future.   Create and bring a copy of your advance directives to your next office visit.  Please schedule your routine diabetic eye exam at your convenience.  These are the goals we discussed: Goals    . Go fishing more       This is a list of the screening recommended for you and due dates:  Health Maintenance  Topic Date Due  .  Hepatitis C: One time screening is recommended by Center for Disease Control  (CDC) for  adults born from [birth year] through 1963.   1946/07/10  . Eye exam for diabetics  06/20/2012  . Urine Protein Check  04/30/2016  . Flu Shot  07/06/2017  . Hemoglobin A1C  07/21/2017  . Tetanus Vaccine  12/17/2017  . Complete foot exam   03/17/2018  . Colon Cancer Screening  02/11/2019  . Pneumonia vaccines  Completed    Fall Prevention in the Home Falls can cause injuries. They can happen to people of all ages. There are many things you can do to make your home safe and to help prevent falls. What can I do on the outside of my home?  Regularly fix the edges of walkways and driveways and fix any cracks.  Remove anything that might make you trip as you walk through a door, such as a raised step or threshold.  Trim any bushes or trees on the path to your home.  Use bright outdoor lighting.  Clear any walking paths of anything that might make someone trip, such as rocks or tools.  Regularly check to see if handrails are loose or broken. Make sure that both sides of any steps have handrails.  Any raised decks and porches should have guardrails on the edges.  Have any leaves, snow, or ice cleared regularly.  Use sand or salt on walking paths during winter.  Clean up any spills in your garage right away. This includes oil or grease  spills. What can I do in the bathroom?  Use night lights.  Install grab bars by the toilet and in the tub and shower. Do not use towel bars as grab bars.  Use non-skid mats or decals in the tub or shower.  If you need to sit down in the shower, use a plastic, non-slip stool.  Keep the floor dry. Clean up any water that spills on the floor as soon as it happens.  Remove soap buildup in the tub or shower regularly.  Attach bath mats securely with double-sided non-slip rug tape.  Do not have throw rugs and other things on the floor that can make you trip. What can I do in the bedroom?  Use night lights.  Make sure that you have a light by your bed that is easy to reach.  Do not use any sheets or blankets that are too big for your bed. They should not hang down onto the floor.  Have a firm chair that has side arms. You can use this for support while you get dressed.  Do not have throw rugs and other things on the floor that can make you trip. What can I do in the kitchen?  Clean up any spills right away.  Avoid walking on wet floors.  Keep items  that you use a lot in easy-to-reach places.  If you need to reach something above you, use a strong step stool that has a grab bar.  Keep electrical cords out of the way.  Do not use floor polish or wax that makes floors slippery. If you must use wax, use non-skid floor wax.  Do not have throw rugs and other things on the floor that can make you trip. What can I do with my stairs?  Do not leave any items on the stairs.  Make sure that there are handrails on both sides of the stairs and use them. Fix handrails that are broken or loose. Make sure that handrails are as long as the stairways.  Check any carpeting to make sure that it is firmly attached to the stairs. Fix any carpet that is loose or worn.  Avoid having throw rugs at the top or bottom of the stairs. If you do have throw rugs, attach them to the floor with carpet  tape.  Make sure that you have a light switch at the top of the stairs and the bottom of the stairs. If you do not have them, ask someone to add them for you. What else can I do to help prevent falls?  Wear shoes that:  Do not have high heels.  Have rubber bottoms.  Are comfortable and fit you well.  Are closed at the toe. Do not wear sandals.  If you use a stepladder:  Make sure that it is fully opened. Do not climb a closed stepladder.  Make sure that both sides of the stepladder are locked into place.  Ask someone to hold it for you, if possible.  Clearly mark and make sure that you can see:  Any grab bars or handrails.  First and last steps.  Where the edge of each step is.  Use tools that help you move around (mobility aids) if they are needed. These include:  Canes.  Walkers.  Scooters.  Crutches.  Turn on the lights when you go into a dark area. Replace any light bulbs as soon as they burn out.  Set up your furniture so you have a clear path. Avoid moving your furniture around.  If any of your floors are uneven, fix them.  If there are any pets around you, be aware of where they are.  Review your medicines with your doctor. Some medicines can make you feel dizzy. This can increase your chance of falling. Ask your doctor what other things that you can do to help prevent falls. This information is not intended to replace advice given to you by your health care provider. Make sure you discuss any questions you have with your health care provider. Document Released: 09/18/2009 Document Revised: 04/29/2016 Document Reviewed: 12/27/2014 Elsevier Interactive Patient Education  2017 [employer].

## 2017-03-18 ENCOUNTER — Other Ambulatory Visit: Payer: Self-pay | Admitting: *Deleted

## 2017-03-18 DIAGNOSIS — E118 Type 2 diabetes mellitus with unspecified complications: Principal | ICD-10-CM

## 2017-03-18 DIAGNOSIS — Z794 Long term (current) use of insulin: Principal | ICD-10-CM

## 2017-03-18 DIAGNOSIS — E1165 Type 2 diabetes mellitus with hyperglycemia: Secondary | ICD-10-CM

## 2017-03-18 DIAGNOSIS — IMO0002 Reserved for concepts with insufficient information to code with codable children: Secondary | ICD-10-CM

## 2017-03-25 ENCOUNTER — Encounter: Payer: Self-pay | Admitting: Interventional Cardiology
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
## 2017-03-28 ENCOUNTER — Ambulatory Visit (INDEPENDENT_AMBULATORY_CARE_PROVIDER_SITE_OTHER): Payer: Medicare PPO | Admitting: Interventional Cardiology

## 2017-03-28 ENCOUNTER — Encounter: Payer: Self-pay | Admitting: Interventional Cardiology

## 2017-03-28 VITALS — BP 136/80 | HR 67 | Ht 69.0 in | Wt 170.0 lb

## 2017-03-28 DIAGNOSIS — R51 Headache: Secondary | ICD-10-CM | POA: Diagnosis not present

## 2017-03-28 DIAGNOSIS — I480 Paroxysmal atrial fibrillation: Secondary | ICD-10-CM

## 2017-03-28 DIAGNOSIS — Z79899 Other long term (current) drug therapy: Secondary | ICD-10-CM | POA: Diagnosis not present

## 2017-03-28 DIAGNOSIS — R519 Headache, unspecified: Secondary | ICD-10-CM

## 2017-03-28 DIAGNOSIS — E785 Hyperlipidemia, unspecified: Secondary | ICD-10-CM | POA: Diagnosis not present

## 2017-03-28 DIAGNOSIS — I25118 Atherosclerotic heart disease of native coronary artery with other forms of angina pectoris: Secondary | ICD-10-CM

## 2017-03-28 NOTE — Patient Instructions (Addendum)
Your physician has recommended you make the following change in your medication: DECREASE AMIODARONE TO  200 MG  EVERY DAY    Your physician recommends that you return for lab work in: [place]  TSH  Your physician wants you to follow-up in: [place] will receive a reminder letter in the mail two months in advance. If you don't receive a letter, please call our office to schedule the follow-up appointment.

## 2017-03-30 ENCOUNTER — Other Ambulatory Visit: Payer: Self-pay | Admitting: Family Medicine

## 2017-03-30 MED FILL — metFORMIN HCL 1000 MG TABS: 1000 | 30 days supply | Qty: 60 | Fill #1

## 2017-03-30 MED FILL — METOPROLOL SUCC ER 50 MG TA: 50 | 30 days supply | Qty: 45 | Fill #4

## 2017-03-30 MED FILL — DIVALPROEX SOD ER 500 MG TA: 500 | 30 days supply | Qty: 30 | Fill #1

## 2017-03-30 MED FILL — LEVOTHYROXINE 88 MCG TABLET: 88 | 30 days supply | Qty: 30 | Fill #0

## 2017-03-30 MED FILL — PRAMIPEXOLE 1 MG TABLET: 1 | 30 days supply | Qty: 30 | Fill #2

## 2017-03-30 MED FILL — ISOSORBIDE MN ER 60 MG TAB: 60 | 90 days supply | Qty: 180 | Fill #0

## 2017-03-30 MED FILL — TOUJEO SOLOSTAR 300 UNITS/M: 300 | 32 days supply | Qty: 5 | Fill #3

## 2017-03-30 MED FILL — PANTOPRAZOLE SOD DR 40 MG T: 40 | 45 days supply | Qty: 45 | Fill #3

## 2017-03-30 MED FILL — MIDODRINE HCL 10 MG TABLET: 10 | 30 days supply | Qty: 90 | Fill #3

## 2017-03-30 MED FILL — ATORVASTATIN 40 MG TABLET: 40 | 30 days supply | Qty: 45 | Fill #1

## 2017-04-03 DIAGNOSIS — G4733 Obstructive sleep apnea (adult) (pediatric): Secondary | ICD-10-CM | POA: Diagnosis not present

## 2017-04-05 ENCOUNTER — Ambulatory Visit: Payer: Medicare PPO | Admitting: Family Medicine

## 2017-04-21 ENCOUNTER — Encounter: Payer: Medicare PPO | Attending: Family Medicine | Admitting: Dietician

## 2017-04-21 ENCOUNTER — Other Ambulatory Visit: Payer: Self-pay

## 2017-04-21 ENCOUNTER — Encounter: Payer: Self-pay | Admitting: Neurology

## 2017-04-21 ENCOUNTER — Encounter: Payer: Self-pay | Admitting: Dietician

## 2017-04-21 ENCOUNTER — Ambulatory Visit (INDEPENDENT_AMBULATORY_CARE_PROVIDER_SITE_OTHER): Payer: Medicare PPO | Admitting: Neurology

## 2017-04-21 VITALS — BP 114/58 | HR 62 | Ht 69.0 in | Wt 181.0 lb

## 2017-04-21 DIAGNOSIS — Z794 Long term (current) use of insulin: Secondary | ICD-10-CM

## 2017-04-21 DIAGNOSIS — I2510 Atherosclerotic heart disease of native coronary artery without angina pectoris: Secondary | ICD-10-CM | POA: Diagnosis not present

## 2017-04-21 DIAGNOSIS — R569 Unspecified convulsions: Secondary | ICD-10-CM

## 2017-04-21 DIAGNOSIS — Z981 Arthrodesis status: Secondary | ICD-10-CM | POA: Insufficient documentation

## 2017-04-21 DIAGNOSIS — G909 Disorder of the autonomic nervous system, unspecified: Secondary | ICD-10-CM | POA: Diagnosis not present

## 2017-04-21 DIAGNOSIS — Z955 Presence of coronary angioplasty implant and graft: Secondary | ICD-10-CM | POA: Diagnosis not present

## 2017-04-21 DIAGNOSIS — R51 Headache: Secondary | ICD-10-CM | POA: Diagnosis not present

## 2017-04-21 DIAGNOSIS — E119 Type 2 diabetes mellitus without complications: Secondary | ICD-10-CM | POA: Diagnosis not present

## 2017-04-21 DIAGNOSIS — G3184 Mild cognitive impairment, so stated: Secondary | ICD-10-CM

## 2017-04-21 DIAGNOSIS — G901 Familial dysautonomia [Riley-Day]: Secondary | ICD-10-CM

## 2017-04-21 DIAGNOSIS — R55 Syncope and collapse: Secondary | ICD-10-CM | POA: Diagnosis not present

## 2017-04-21 DIAGNOSIS — Z713 Dietary counseling and surveillance: Secondary | ICD-10-CM | POA: Insufficient documentation

## 2017-04-21 DIAGNOSIS — M5481 Occipital neuralgia: Secondary | ICD-10-CM | POA: Diagnosis not present

## 2017-04-21 DIAGNOSIS — E1100 Type 2 diabetes mellitus with hyperosmolarity without nonketotic hyperglycemic-hyperosmolar coma (NKHHC): Secondary | ICD-10-CM

## 2017-04-21 DIAGNOSIS — G4486 Cervicogenic headache: Secondary | ICD-10-CM

## 2017-04-21 NOTE — Patient Instructions (Signed)
Find ways to become more active.  Consider [employer]. Choose baked, broiled, or grilled rather than fried most often. Read your labels for carbohydrate and fat.  Consider the Calorie [employer] or [employer].com to help you make healthier choices when eating out. When you have a snack, include a small amount of protein. Increase your vegetable intake.   Aim for 3-4 Carb Choices per meal (45-60 grams) +/- 1 either way  Aim for 0-1 Carbs per snack if hungry  Include protein in moderation with your meals and snacks Continue checking BG at alternate times per day as directed by MD  Continue taking medication as directed by MD

## 2017-04-21 NOTE — Progress Notes (Signed)
NEUROLOGY FOLLOW UP OFFICE NOTE  ULYSES [name] [id number]  HISTORY OF PRESENT ILLNESS: [name] [name] is a 71 year old right-handed man with type 2 diabetes mellitus, hypothyroidism, OSA, hyperlipidemia, coronary artery disease, and hypertension who follows up for cervicogenic headache and recurrent syncope.   UPDATE: He underwent neuropsychological testing on 03/07/17.  Testing revealed mild executive dysfunction compatible with a mild frontal-subcortical dysfunction.  Given fluctuations in cognitive ability, this may be due to repeated cerebral hypoperfusion and history of poorly controlled diabetes with recurrent hyperglycemic episodes.  Testing was not consistent with Alzheimer's and does not suggest prior concussion as a possible cause.    Headaches are daily.  They start in the back of his neck and radiates up the occiput to the front.  Turning his neck to the left aggravates it.  He is taking the Depakote ER 500mg  daily.  Sugars are still poorly controlled but reportedly improved (usually high 200s and low 300s).   HISTORY: Cervicogenic headache/occipital neuralgia: In March 2016, he was struck several times in the face mask while playing catcher in a baseball game over the course of two games.   At least one of the times, he was hit in the left eye.  He doesn't think he lost consciousness, but it did knock him down to the ground.  The next day, he began to feel nauseous and diaphoretic.  He went home and passed out of the bed.  EMS was called and he was brought to the ED, where CT of the head was unremarkable.  CT of the cervical spine showed C6-7 degenerative disc disease but otherwise unremarkable.  He also developed a headache.  It starts in the back of his head and travels up to the forehead.  It is a "hurting" pain and his eyes hurt.  It can be anywhere from 3 to 9/10.  It lasts from 30 to 60 minutes and occurs three times a day.  It is associated with nausea, photophobia,  phonophobia and some blurred vision.  It causes him to stagger.  It is not positional.  He also notes pain in the right suboccipital region, which causes numbness and tingling traveling up the back of his head.  He notes that his neck feels stiff.  Due to persistent headache, he had another CT of the head performed on 02/26/15, which was stable.  Occipital nerve block has helped.  Due to persistent occipital headaches and worsening arm weakness, he had a repeat MRI of cervical spine on 05/01/16 revealed multilevel foraminal stenosis at C3-4, C4-5, C5-6 and C6-7.  Epidural injections were effective.  He underwent anterior cervical decompression and fusion in June 2017, which helped but pain subsequently returned.   Recurrent syncope and confusion: He has been experiencing episodes of syncope and confusion.  He will suddenly feel lightheaded, diaphoretic, experience palpitations and then tunnel vision.  He will then pass out.  He doesn't actually remember falling.  He is unconscious very briefly but he appears to be in a deep sleep.  There are no convulsions, bowel or bladder incontinence or tongue biting.  When he wakes up, he is confused and slurs his speech from 30 to 60 minutes.  If he is up afterwards, he is unsteady on his feet.  It happens at rest but is much worse if he stands up.  This can occur almost daily.  In addition, he appears to have memory issues.  He often misplaces items such as his wallet.  He will  put down one cup in the kitchen and then grab for another cup, forgetting that he already had one.  He is not acting like himself.  He seems to be having more vivid dreams.  He will wake up from sleep, still acting out from his dream.  One night, he woke up calling for his deceased father and brother.  Another time, he woke up in the middle of the night when his wife got up to go use the restroom.  He sat up confused and asked where he was and where home is.  He has had multiple hospital visits with  extensive workup.  Blood pressure was noted to be as low as 70s/40s with pulse dropping into the 60s.  MRI of brain from 03/09/16 was unremarkable for acute abnormality.  CTA of head and neck was unremarkable.  Echo showed EF 55-60% with grade 1 diastolic dysfunction.  Routine EEG was normal.  Telemetry revealed no arrhythmias.  He was noted to be bradycardic with HR in the 50s, so his beta blocker dose was reduced.  Diagnosis was unclear but his ASA was switched to Plavix.   He underwent long term EEG monitoring for 24 hours, which was normal but no event was captured.  Repeat routine EEG on 03/18/16 was also normal.   72 hour ambulatory EEG from 04/05/16 to 04/08/16 was normal.  He did exhibit episodes of palpitations and dizziness without electrographic correlate but did not have any syncopal events.  He had a 48 hour Holter monitor which revealed NSR with occasional PVCs, but no pathologic arrhythmias.  He was then hooked up to an event monitor which demonstrated atrial fibrillation and flutter.  For orthostasis, he takes midodrine.  On 12/24/16, he was found to have the flu.  He returned to the ED on 01/02/17 after his wife woke up and found him having seizure-like activity while sleeping.  He had urinary incontinence but did not bite his tongue.  His wife cannot tell me the duration of shaking but he was confused afterwards for a while.  CT of head was unremarkable.  He has not had any prior witnessed seizures but he said that on at least one other occasion, he had woken up having wet the bed.  Cognitive deficits/Memory deficits and confusion: He has episodes of confusion and memory loss.  He will suddenly not remember where he is.  Today, he did not remember that the [facility] had taken his vitals.  One time, he was driving to [place] from [facility] but got lost and ended up in [place]. [place].  He sometimes reports double vision.  He reports difficulty initiating walking when he first stands up.  He reports he sometimes  shuffles and sometimes has a tremor in his hands, especially on the left. He previously endorsed auditory hallucinations (hearing a girl's voice calling him), however that resolved after he discontinued baclofen.  PAST MEDICAL HISTORY: Past Medical History:  Diagnosis Date  . ACP (advance care planning) 05/11/2015  . Advance care planning 05/01/2015  . Anemia    hemoglobin 7.4, iron deficiency, January, 2011, 2 unit transfusion, endoscopy normal, capsule endoscopy February, 2011 no small bowel abnormalities.   Most likely source gastric erosions, followed by GI  . Anxiety   . CAD (coronary artery disease)    A. CABG in 2000,status post cardiac cath in 2006, 2009 ....continued chest pain and SOB despite oral medication adjestments including Ranexa. B. Cath November 2009/ mRCA - 2.75 x 23 Abbott Xience V  drug-eluting stent ...11/26/2008 to distal  RCA leading to acute marginal.  C. Cath 07/2012 for CP - stable anatomy, med rx. d. cath 2015 and 05/30/2015 stable anatomy, consider Myoview if has CP again  . Cerebral ischemia    MRI November, 2010, chronic microvascular ischemia  . Chronic diastolic CHF (congestive heart failure) ([place]) 03/13/2016  . Depression    Bipolar  . Diabetes mellitus type II, uncontrolled ([place])   . Diabetes mellitus without complication ([place])   . Diarrhea 02/15/2016  . Dizziness   . Edema   . Ejection fraction    EF 60%, echo, July 31, 2012  . Falling episodes    these have occurred in the past and again recurring 2011  . Family history of adverse reaction to anesthesia    "mother died during bypass surgery but not sure if it has to do with anesthesia"  . Gastric ulcer   . GERD (gastroesophageal reflux disease)   . H/O medication noncompliance    Due to loss of insurance  . Hard of hearing   . Headache 01/25/2016  . Hx of CABG    2000,  / one median sternotomy suture broken her chest x-ray November, 2010, no clinical significance  . Hyperlipidemia   . Hypertension      pt. denies  . Iron deficiency anemia   . Low back pain 06/12/2009   Qualifier: Diagnosis of  By: [name]   . Nausea with vomiting 01/25/2016  . Nephrolithiasis   . OSA (obstructive sleep apnea)   . Pain in limb 06/12/2009   Qualifier: Diagnosis of  By: [name]   . Palpitations    event recorder showed sinus rhythm  . RBBB (right bundle branch block)   . Restless leg   . Right knee pain 01/07/2015  . Shortness of breath    CPX April, 2011, mild functional limitation, no clear pulmonary or cardiac limitation, possible deconditioning and mild chronotropic incompetence( peak heart rate 130)  . Spondylosis    C5-6, C6-7 MRI 2010  . Syncope 03/2016  . Thyroid disease   . Tubulovillous adenoma of colon 2007  . Wears glasses     MEDICATIONS: Current Outpatient Prescriptions on File Prior to Visit  Medication Sig Dispense Refill  . albuterol (PROVENTIL HFA;VENTOLIN HFA) 108 (90 Base) MCG/ACT inhaler Inhale 2 puffs into the lungs every 6 (six) hours as needed for wheezing or shortness of breath. 1 Inhaler 0  . amiodarone (PACERONE) 400 MG tablet Take 200 mg by mouth daily.    Marland Kitchen atorvastatin (LIPITOR) 40 MG tablet Take 1.5 tablets (60 mg total) by mouth daily. 45 tablet 3  . diclofenac sodium (VOLTAREN) 1 % GEL Apply 2 g topically daily as needed (for pain in affected area).    . divalproex (DEPAKOTE ER) 500 MG 24 hr tablet Take 1 tablet (500 mg total) by mouth daily. 30 tablet 2  . gabapentin (NEURONTIN) 300 MG capsule Take 2 capsules (600 mg) by mouth  every morning, 1 capsule (300 mg) by mouth  at noon, and 2 capsules (600 mg) by mouth at night    . glucose blood (ACCU-CHEK AVIVA) test strip Use as directed to check blood sugar 4 times daily.  DX E11.9 200 each 6  . insulin aspart (NOVOLOG) 100 UNIT/ML injection Inject 10-17 Units into the skin 3 (three) times daily with meals. Use 10-17 unit 3 times daily before meals 15 mL 4  . Insulin Glargine (TOUJEO SOLOSTAR) 300  UNIT/ML SOPN Inject 42 Units into the skin at bedtime. (Patient taking differently: Inject 48 Units into the skin at bedtime. ) 3 pen 4  . insulin NPH Human (HUMULIN N,NOVOLIN N) 100 UNIT/ML injection Inject 10-20 Units Twice Daily as instructed 10 mL 11  . isosorbide mononitrate (IMDUR) 60 MG 24 hr tablet TAKE TWO TABLETS BY MOUTH DAILY 180 tablet 0  . levothyroxine (SYNTHROID, LEVOTHROID) 88 MCG tablet TAKE 1 TABLET (88 MCG TOTAL) BY MOUTH DAILY BEFORE BREAKFAST. 30 tablet 6  . loperamide (IMODIUM A-D) 2 MG tablet Take 1 tablet (2 mg total) by mouth 4 (four) times daily as needed for diarrhea or loose stools. 30 tablet 0  . meclizine (ANTIVERT) 12.5 MG tablet Take 1 tablet (12.5 mg total) by mouth 3 (three) times daily as needed for dizziness. 10 tablet 0  . metFORMIN (GLUCOPHAGE) 1000 MG tablet TAKE 1 TABLET (1,000 MG TOTAL) BY MOUTH 2 (TWO) TIMES DAILY WITH A MEAL. 60 tablet 5  . metoprolol succinate (TOPROL-XL) 50 MG 24 hr tablet Take 1 and a half tablets once a day for a total of 75 mg. (Patient taking differently: Take 75 mg by mouth daily. ) 45 tablet 6  . midodrine (PROAMATINE) 10 MG tablet Take 1 tablet (10 mg total) by mouth 3 (three) times daily. 90 tablet 3  . nitroGLYCERIN (NITROSTAT) 0.4 MG SL tablet Place 0.4 mg under the tongue every 5 (five) minutes as needed for chest pain.    Marland Kitchen ondansetron (ZOFRAN ODT) 4 MG disintegrating tablet Take 1 tablet (4 mg total) by mouth every 8 (eight) hours as needed for nausea or vomiting. 10 tablet 1  . pantoprazole (PROTONIX) 40 MG tablet TAKE 1 TABLET BY MOUTH TWICE DAILY FOR 2 WEEKS THEN DROP TO ONCE DAILY 45 tablet 5  . pramipexole (MIRAPEX) 1 MG tablet TAKE 1/2 TO 1 TABLET BY MOUTH AT BEDTIME 30 tablet 2  . sertraline (ZOLOFT) 100 MG tablet Take 1 tablet (100 mg total) by mouth 2 (two) times daily. 180 tablet 1  . tiZANidine (ZANAFLEX) 4 MG tablet Take 1 tablet (4 mg total) by mouth every 8 (eight) hours as needed for muscle spasms. 90 tablet 2   . topiramate (TOPAMAX) 50 MG tablet TAKE 1 TABLET (50 MG) BY MOUTH AT BEDTIME. 30 tablet 3  . Vitamin D, Ergocalciferol, (DRISDOL) 50000 units CAPS capsule Take 1 capsule (50,000 Units total) by mouth every 7 (seven) days. 12 capsule 2  . warfarin (COUMADIN) 5 MG tablet TAKE AS DIRECTED BY COUMADIN CLINIC 35 tablet 3   No current facility-administered medications on file prior to visit.     ALLERGIES: Allergies  Allergen Reactions  . Morphine Other (See Comments)    hallucinations  . Shellfish-Derived Products Itching    FAMILY HISTORY: Family History  Problem Relation Age of Onset  . Pancreatic cancer Brother   . Diabetes Brother   . Coronary artery disease Brother   . Stroke Brother   . Diabetes Brother   . Diabetes Mother   . Heart failure Mother   . Heart failure Father   . Hypothyroidism Brother   . Coronary artery disease Brother   . Other Brother        colon surgery  . Heart attack Unknown        Nephew  . Irregular heart beat Daughter   . Cancer Maternal Grandmother        unknown     SOCIAL HISTORY: Social History   Social History  .  Marital status: Married    Spouse name: N/A  . Number of children: 4  . Years of education: 64   Occupational History  . retired    Social History Main Topics  . Smoking status: Never Smoker  . Smokeless tobacco: Never Used  . Alcohol use No     Comment: stopped drinking in 1998  . Drug use: No  . Sexual activity: Yes    Partners: Female   Other Topics Concern  . Not on file   Social History Narrative   Patient is right handed.   Patient does not drink caffeine.    REVIEW OF SYSTEMS: Constitutional: No fevers, chills, or sweats, no generalized fatigue, change in appetite Eyes: No visual changes, double vision, eye pain Ear, nose and throat: No hearing loss, ear pain, nasal congestion, sore throat Cardiovascular: No chest pain, palpitations Respiratory:  No shortness of breath at rest or with exertion,  wheezes GastrointestinaI: No nausea, vomiting, diarrhea, abdominal pain, fecal incontinence Genitourinary:  No dysuria, urinary retention or frequency Musculoskeletal:  Neck pain Integumentary: No rash, pruritus, skin lesions Neurological: as above Psychiatric: No depression, insomnia, anxiety Endocrine: No palpitations, fatigue, diaphoresis, mood swings, change in appetite, change in weight, increased thirst Hematologic/Lymphatic:  No purpura, petechiae. Allergic/Immunologic: no itchy/runny eyes, nasal congestion, recent allergic reactions, rashes  PHYSICAL EXAM: Vitals:   04/21/17 0948  BP: (!) 114/58  Pulse: 62   General: No acute distress.  Patient appears well-groomed.  well body habitus. Head:  Normocephalic/atraumatic Eyes:  Fundi examined but not visualized Neck: supple, bilateral suboccipital and paraspinal tenderness, decreased range of motion to the left. Heart:  Regular rate and rhythm Lungs:  Clear to auscultation bilaterally Back: No paraspinal tenderness Neurological Exam: alert and oriented to person, place, and time. Attention span and concentration intact, recent and remote memory intact, fund of knowledge intact.  Speech fluent and not dysarthric, language intact.  CN II-XII intact. Bulk and tone normal, muscle strength 5/5 throughout.  Sensation to light touch intact.  Deep tendon reflexes 2+ throughout.  Finger to nose and heel to shin testing intact.  Gait normal, Romberg negative.  IMPRESSION: I Non-amnestic Mild Cognitive Impairment, reflective of mild frontal-subcortical dysfunction.  Most likely due to cerebral hypoperfusion and uncontrolled diabetes.  Also considered are multiple system atrophy (given the dysautonomia).  Less likely would be Lewy Body Dementia (he endorses auditory hallucinations rather than visual, which appeared to be medication side effect.  He had endorsed shuffling gait and tremor, which is not appreciated at this time).  Testing is not  consistent with Alzheimer's dementia. II Recurrent Syncope/Near Syncope, likely related to underlying dysautonomia and possibly atrial fibrillation.  Episodes of confusion may be related to hyperglycemia or cerebral hypoperfusion. III Cervicogenic headache/Occipital neuralgia IV  Isolated seizure  PLAN: 1.  He is on multiple medications and has had side effects to many of them.  I would like to refer him to pain specialist for potential epidural injections, which have been effective in the past.   2.  Continue Depakote ER 500mg  daily 3.  Continue CPAP 4.  Discussed optimizing vascular risk factors, especially glycemic control 5.  Maintain appropriate blood pressure to prevent cerebral hypoperfusion 6.  Repeat neuropsychological testing in one year. 7.  Follow up with me in 4 to 5 months.  25 minutes spent face to face with patient, over 50% spent discussing diagnoses and .  Metta Clines, DO  CC:  Penni Homans, MD

## 2017-04-21 NOTE — Patient Instructions (Signed)
1.  Continue the depakote 2.  We will refer you to an interventional pain specialist.  We will refer you to the doctor at the neurosurgeon's office 3.  Follow up in 4 to 5 months 4.  Repeat neuropsychological testing in one year (if not already rescheduled)

## 2017-04-21 NOTE — Progress Notes (Signed)
Diabetes Self-Management Education  Visit Type: First/Initial  Appt. Start Time: 0815 Appt. End Time: 0930 Patient was late to the appointment. 04/21/2017  [name], identified by name and date of birth, is a 71 y.o. male with a diagnosis of Diabetes: Type 2.  He has had type 2 diabetes for around 30 years and is now insulin requiring. Medications include:  Metformin, coumadin, lipitor, synthroid, Toujeo 48 units every night and Novolog (sliding scale based on blood glucose with a max of 12 units).  The Novolog will be changing to NPH bid when he picks it up from the pharmacy. Other hx includes CAD s/p CABG in 2000 and stent in 2009.  Currently he is having severe headaches s/p neck surgery.   Patient lives with his wife.  His 2 daughters and grandchildren also live there.  He states that he has increased stress.  He is retired from [employer].  His wife also has type 2 diabetes, uncontrolled.   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  He knows the owner of [employer] and was invited to help with the children there.  This is something he enjoys a lot and is willing to do which would increase his activity level.  ASSESSMENT  Height 5\' 9"  (1.753 m), weight 181 lb (82.1 kg). Body mass index is 26.73 kg/m.      Diabetes Self-Management Education - 04/21/17 0831      Visit Information   Visit Type First/Initial     Initial Visit   Diabetes Type Type 2   Are you currently following a meal plan? No   Are you taking your medications as prescribed? Yes  occasionally forgets the Novolog   Date Diagnosed [place]   How would you rate your overall health? Fair     Psychosocial Assessment   Patient Belief/Attitude about Diabetes Motivated to manage diabetes   Self-care barriers None   Self-management support Doctor's office;Family   Other persons present Patient;Spouse/SO;Family Member  daughter   Patient Concerns Nutrition/Meal  planning;Glycemic Control   Special Needs None   Preferred Learning Style No preference indicated   Learning Readiness Ready   How often do you need to have someone help you when you read instructions, pamphlets, or other written materials from your doctor or pharmacy? 1 - Never   What is the last grade level you completed in school? 12th grade     Pre-Education Assessment   Patient understands the diabetes disease and treatment process. Needs Instruction   Patient understands incorporating nutritional management into lifestyle. Needs Instruction   Patient undertands incorporating physical activity into lifestyle. Needs Review   Patient understands using medications safely. Demonstrates understanding / competency   Patient understands monitoring blood glucose, interpreting and using results Needs Review   Patient understands prevention, detection, and treatment of acute complications. Needs Review   Patient understands prevention, detection, and treatment of chronic complications. Demonstrates understanding / competency   Patient understands how to develop strategies to address psychosocial issues. Needs Review   Patient understands how to develop strategies to promote health/change behavior. Needs Review     Complications   Last HgB A1C per patient/outside source 9.5 %  01/21/17 decreased from 10.2% 09/2016   How often do you check your blood sugar? 3-4 times/day   Fasting Blood glucose range (mg/dL) 70-129;130-179  115 in the am and 175 prior to other meals   Number  of hypoglycemic episodes per month 4   Can you tell when your blood sugar is low? Yes   What do you do if your blood sugar is low? drinks 1/2 cup of sweet tea   Number of hyperglycemic episodes per week 7   Can you tell when your blood sugar is high? Yes   What do you do if your blood sugar is high? takes extra novolog (2-3 units)   Have you had a dilated eye exam in the past 12 months? No  "It's time"   Have you had a  dental exam in the past 12 months? No   Are you checking your feet? Yes   How many days per week are you checking your feet? 7     Dietary Intake   Breakfast [employer] egg Mc Muffin or diner (eggs, etc)  9:30-10   Snack (morning) apple or grapes or other fruit   Lunch [place] sandwich on white bread, mayo   Snack (afternoon) fruit   Dinner fish sandwich, spaghetti, pork chops, chicken, fish, hot dogs generally with vegetables/salad  6-9:30 Sometimes daughter brings something home   Snack (evening) bag of popcorn, cheese its, occasional cookies   Beverage(s) Unsweetened tea, small amount of water, diet Pepsi     Exercise   Exercise Type Light (walking / raking leaves)   How many days per week to you exercise? 7   How many minutes per day do you exercise? 25   Total minutes per week of exercise 175     Patient Education   Previous Diabetes Education No   Disease state  Definition of diabetes, type 1 and 2, and the diagnosis of diabetes;Explored patient's options for treatment of their diabetes   Nutrition management  Role of diet in the treatment of diabetes and the relationship between the three main macronutrients and blood glucose level;Food label reading, portion sizes and measuring food.;Meal timing in regards to the patients' current diabetes medication.;Meal options for control of blood glucose level and chronic complications.;Information on hints to eating out and maintain blood glucose control.   Physical activity and exercise  Role of exercise on diabetes management, blood pressure control and cardiac health.;Helped patient identify appropriate exercises in relation to his/her diabetes, diabetes complications and other health issue.   Medications Reviewed patients medication for diabetes, action, purpose, timing of dose and side effects.   Monitoring Purpose and frequency of SMBG.;Identified appropriate SMBG and/or A1C goals.;Daily foot exams;Yearly dilated eye exam   Acute  complications Taught treatment of hypoglycemia - the 15 rule.   Chronic complications Identified and discussed with patient  current chronic complications   Psychosocial adjustment Worked with patient to identify barriers to care and solutions;Role of stress on diabetes;Identified and addressed patients feelings and concerns about diabetes;Brainstormed with patient on coping mechanisms for social situations, getting support from significant others, dealing with feelings about diabetes   Personal strategies to promote health Lifestyle issues that need to be addressed for better diabetes care     Individualized Goals (developed by patient)   Nutrition General guidelines for healthy choices and portions discussed   Physical Activity Exercise 5-7 days per week;30 minutes per day   Medications take my medication as prescribed   Monitoring  test my blood glucose as discussed;Other (comment)  remember to take meal time insulin   Problem Solving making healthier choices when eating out   Reducing Risk examine blood glucose patterns;do foot checks daily;increase portions of nuts and seeds   Health  Coping discuss diabetes with (comment)  MD/RD     Post-Education Assessment   Patient understands the diabetes disease and treatment process. Demonstrates understanding / competency   Patient understands incorporating nutritional management into lifestyle. Demonstrates understanding / competency   Patient undertands incorporating physical activity into lifestyle. Demonstrates understanding / competency   Patient understands using medications safely. Demonstrates understanding / competency   Patient understands monitoring blood glucose, interpreting and using results Demonstrates understanding / competency   Patient understands prevention, detection, and treatment of acute complications. Demonstrates understanding / competency   Patient understands prevention, detection, and treatment of chronic complications.  Demonstrates understanding / competency   Patient understands how to develop strategies to address psychosocial issues. Demonstrates understanding / competency   Patient understands how to develop strategies to promote health/change behavior. Demonstrates understanding / competency     Outcomes   Expected Outcomes Demonstrated interest in learning. Expect positive outcomes   Future DMSE PRN   Program Status Completed      Individualized Plan for Diabetes Self-Management Training:   Learning Objective:  Patient will have a greater understanding of diabetes self-management. Patient education plan is to attend individual and/or group sessions per assessed needs and concerns.   Plan:   Patient Instructions  Find ways to become more active.  Consider [employer]. Choose baked, broiled, or grilled rather than fried most often. Read your labels for carbohydrate and fat.  Consider the Calorie [employer] or [employer].com to help you make healthier choices when eating out. When you have a snack, include a small amount of protein. Increase your vegetable intake.   Aim for 3-4 Carb Choices per meal (45-60 grams) +/- 1 either way  Aim for 0-1 Carbs per snack if hungry  Include protein in moderation with your meals and snacks Continue checking BG at alternate times per day as directed by MD  Continue taking medication as directed by MD      Expected Outcomes:  Demonstrated interest in learning. Expect positive outcomes  Education material provided: Living Well with Diabetes, Food label handouts, A1C conversion sheet, Meal plan card, My Plate and Snack sheet, Dining out with diabetes, Making healthy fast food choices.  If problems or questions, patient to contact team via:  Phone  Future DSME appointment: PRN

## 2017-04-25 ENCOUNTER — Ambulatory Visit (INDEPENDENT_AMBULATORY_CARE_PROVIDER_SITE_OTHER): Payer: Medicare PPO

## 2017-04-25 DIAGNOSIS — Z5181 Encounter for therapeutic drug level monitoring: Secondary | ICD-10-CM | POA: Diagnosis not present

## 2017-04-25 DIAGNOSIS — I480 Paroxysmal atrial fibrillation: Secondary | ICD-10-CM | POA: Diagnosis not present

## 2017-04-25 DIAGNOSIS — Z7901 Long term (current) use of anticoagulants: Secondary | ICD-10-CM

## 2017-04-25 DIAGNOSIS — I4891 Unspecified atrial fibrillation: Secondary | ICD-10-CM | POA: Diagnosis not present

## 2017-04-25 LAB — POCT INR: INR: 1.3

## 2017-04-25 MED FILL — metFORMIN HCL 1000 MG TABS: 1000 | 30 days supply | Qty: 60 | Fill #2

## 2017-04-25 MED FILL — METOPROLOL SUCC ER 50 MG TA: 50 | 30 days supply | Qty: 45 | Fill #5

## 2017-04-25 MED FILL — DIVALPROEX SOD ER 500 MG TA: 500 | 30 days supply | Qty: 30 | Fill #2

## 2017-04-25 MED FILL — LEVOTHYROXINE 88 MCG TABLET: 88 | 30 days supply | Qty: 30 | Fill #1

## 2017-04-25 MED FILL — WARFARIN SODIUM 5 MG TABLET: 5 | 30 days supply | Qty: 35 | Fill #1

## 2017-04-25 MED FILL — ATORVASTATIN 40 MG TABLET: 40 | 30 days supply | Qty: 45 | Fill #2

## 2017-04-26 MED FILL — TOUJEO SOLOSTAR 300 UNITS/M: 300 | 32 days supply | Qty: 5 | Fill #4

## 2017-04-27 ENCOUNTER — Other Ambulatory Visit: Payer: Self-pay | Admitting: Family Medicine

## 2017-04-28 MED FILL — PRAMIPEXOLE 1 MG TABLET: 1 | 30 days supply | Qty: 30 | Fill #0

## 2017-05-03 DIAGNOSIS — G4733 Obstructive sleep apnea (adult) (pediatric): Secondary | ICD-10-CM | POA: Diagnosis not present

## 2017-05-05 ENCOUNTER — Ambulatory Visit (INDEPENDENT_AMBULATORY_CARE_PROVIDER_SITE_OTHER): Payer: Medicare PPO | Admitting: *Deleted

## 2017-05-05 DIAGNOSIS — Z7901 Long term (current) use of anticoagulants: Secondary | ICD-10-CM

## 2017-05-05 DIAGNOSIS — Z5181 Encounter for therapeutic drug level monitoring: Secondary | ICD-10-CM

## 2017-05-05 DIAGNOSIS — I4891 Unspecified atrial fibrillation: Secondary | ICD-10-CM | POA: Diagnosis not present

## 2017-05-05 DIAGNOSIS — I480 Paroxysmal atrial fibrillation: Secondary | ICD-10-CM | POA: Diagnosis not present

## 2017-05-05 LAB — POCT INR: INR: 1.8

## 2017-05-06 DIAGNOSIS — G4733 Obstructive sleep apnea (adult) (pediatric): Secondary | ICD-10-CM | POA: Diagnosis not present

## 2017-05-09 ENCOUNTER — Other Ambulatory Visit: Payer: Self-pay | Admitting: Interventional Cardiology

## 2017-05-09 ENCOUNTER — Other Ambulatory Visit: Payer: Self-pay | Admitting: Family Medicine

## 2017-05-17 ENCOUNTER — Ambulatory Visit (INDEPENDENT_AMBULATORY_CARE_PROVIDER_SITE_OTHER): Payer: Medicare PPO | Admitting: Family Medicine

## 2017-05-17 ENCOUNTER — Encounter: Payer: Self-pay | Admitting: Family Medicine

## 2017-05-17 VITALS — BP 120/68 | HR 68 | Temp 98.3°F | Resp 18 | Wt 178.2 lb

## 2017-05-17 DIAGNOSIS — E039 Hypothyroidism, unspecified: Secondary | ICD-10-CM

## 2017-05-17 DIAGNOSIS — R209 Unspecified disturbances of skin sensation: Secondary | ICD-10-CM | POA: Diagnosis not present

## 2017-05-17 DIAGNOSIS — E1100 Type 2 diabetes mellitus with hyperosmolarity without nonketotic hyperglycemic-hyperosmolar coma (NKHHC): Secondary | ICD-10-CM

## 2017-05-17 DIAGNOSIS — E782 Mixed hyperlipidemia: Secondary | ICD-10-CM | POA: Diagnosis not present

## 2017-05-17 DIAGNOSIS — M542 Cervicalgia: Secondary | ICD-10-CM

## 2017-05-17 DIAGNOSIS — E559 Vitamin D deficiency, unspecified: Secondary | ICD-10-CM

## 2017-05-17 DIAGNOSIS — Z794 Long term (current) use of insulin: Secondary | ICD-10-CM

## 2017-05-17 DIAGNOSIS — I4891 Unspecified atrial fibrillation: Secondary | ICD-10-CM | POA: Diagnosis not present

## 2017-05-17 DIAGNOSIS — M4802 Spinal stenosis, cervical region: Secondary | ICD-10-CM | POA: Diagnosis not present

## 2017-05-17 DIAGNOSIS — R519 Headache, unspecified: Secondary | ICD-10-CM

## 2017-05-17 DIAGNOSIS — M25561 Pain in right knee: Secondary | ICD-10-CM

## 2017-05-17 DIAGNOSIS — I5032 Chronic diastolic (congestive) heart failure: Secondary | ICD-10-CM

## 2017-05-17 DIAGNOSIS — R51 Headache: Secondary | ICD-10-CM

## 2017-05-17 DIAGNOSIS — M25562 Pain in left knee: Secondary | ICD-10-CM

## 2017-05-17 DIAGNOSIS — IMO0001 Reserved for inherently not codable concepts without codable children: Secondary | ICD-10-CM

## 2017-05-17 DIAGNOSIS — I10 Essential (primary) hypertension: Secondary | ICD-10-CM

## 2017-05-17 HISTORY — DX: Vitamin D deficiency, unspecified: E55.9

## 2017-05-17 MED ORDER — INSULIN GLARGINE 300 UNIT/ML ~~LOC~~ SOPN: 48.0000 [IU] | PEN_INJECTOR | Freq: Every day | SUBCUTANEOUS | 4 refills | Status: DC

## 2017-05-17 MED FILL — PANTOPRAZOLE SOD DR 40 MG T: 40 | 45 days supply | Qty: 45 | Fill #4

## 2017-05-17 MED FILL — MIDODRINE HCL 10 MG TABLET: 10 | 30 days supply | Qty: 90 | Fill #0

## 2017-05-17 MED FILL — SERTRALINE HCL 100 MG TAB: 100 | 90 days supply | Qty: 180 | Fill #0

## 2017-05-17 NOTE — Progress Notes (Signed)
Subjective:  I acted as a Education administrator for Dr. Charlett Blake. [place], [place]  Patient ID: [name] [name], male    DOB: 1946-03-01, 71 y.o.   MRN: [id number]  Chief Complaint  Patient presents with  . Follow-up    HPI  Patient is in today for a follow up. Patient stated he still having headaches, will follow up with neurology in July. No recent febrile illness or acute hospitalizations. Denies CP/palp/SOB/HA/congestion/fevers/GI or GU c/o. Taking meds as prescribed. He continues to struggle with daily pain and presently his pain is most notable in his knees and neck. No recent fall or injury. No swelling or injury. Notes his blood sugar is improving. His am sugars are below 150 and he denies polyuria or polydipsia.    Patient Care Team: [name], MD as PCP - General (Family Medicine) Pieter Partridge, DO as Consulting Physician (Neurology) [name], MD as Consulting Physician (Cardiology) Deboraha Sprang, MD as Consulting Physician (Cardiology) Copeland, Mike Gip, MD as Consulting Physician (Pulmonary Disease) Warden Fillers, MD as Consulting Physician (Ophthalmology)   Past Medical History:  Diagnosis Date  . ACP (advance care planning) 05/11/2015  . Advance care planning 05/01/2015  . Anemia    hemoglobin 7.4, iron deficiency, January, 2011, 2 unit transfusion, endoscopy normal, capsule endoscopy February, 2011 no small bowel abnormalities.   Most likely source gastric erosions, followed by GI  . Anxiety   . CAD (coronary artery disease)    A. CABG in 2000,status post cardiac cath in 2006, 2009 ....continued chest pain and SOB despite oral medication adjestments including Ranexa. B. Cath November 2009/ mRCA - 2.75 x 23 Abbott Xience V drug-eluting stent ...11/26/2008 to distal  RCA leading to acute marginal.  C. Cath 07/2012 for CP - stable anatomy, med rx. d. cath 2015 and 05/30/2015 stable anatomy, consider Myoview if has CP again  . Cerebral ischemia    MRI November,  2010, chronic microvascular ischemia  . Chronic diastolic CHF (congestive heart failure) ([place]) 03/13/2016  . Depression    Bipolar  . Diabetes mellitus type II, uncontrolled ([place])   . Diabetes mellitus without complication ([place])   . Diarrhea 02/15/2016  . Dizziness   . Edema   . Ejection fraction    EF 60%, echo, July 31, 2012  . Falling episodes    these have occurred in the past and again recurring 2011  . Family history of adverse reaction to anesthesia    "mother died during bypass surgery but not sure if it has to do with anesthesia"  . Gastric ulcer   . GERD (gastroesophageal reflux disease)   . H/O medication noncompliance    Due to loss of insurance  . Hard of hearing   . Headache 01/25/2016  . Hx of CABG    2000,  / one median sternotomy suture broken her chest x-ray November, 2010, no clinical significance  . Hyperlipidemia   . Hypertension    pt. denies  . Iron deficiency anemia   . Low back pain 06/12/2009   Qualifier: Diagnosis of  By: [name]   . Nausea with vomiting 01/25/2016  . Nephrolithiasis   . OSA (obstructive sleep apnea)   . Pain in limb 06/12/2009   Qualifier: Diagnosis of  By: [name]   . Palpitations    event recorder showed sinus rhythm  . RBBB (right bundle branch block)   . Restless leg   . Right knee pain  01/07/2015  . Shortness of breath    CPX April, 2011, mild functional limitation, no clear pulmonary or cardiac limitation, possible deconditioning and mild chronotropic incompetence( peak heart rate 130)  . Spondylosis    C5-6, C6-7 MRI 2010  . Syncope 03/2016  . Thyroid disease   . Tubulovillous adenoma of colon 2007  . Vitamin D deficiency 05/17/2017  . Wears glasses     Past Surgical History:  Procedure Laterality Date  . ANTERIOR CERVICAL DECOMP/DISCECTOMY FUSION N/A 05/31/2016   Procedure: ANTERIOR CERVICAL DECOMPRESSION/DISCECTOMY FUSION CERVICAL FIVE-SIX,CERVICAL SIX-SEVEN;  Surgeon: [name], MD;  Location: [address]  NEURO ORS;  Service: Neurosurgery;  Laterality: N/A;  . CARDIAC CATHETERIZATION N/A 05/30/2015   Procedure: Left Heart Cath and Coronary Angiography;  Surgeon: [name], MD;  Location: [facility] CV LAB;  Service: Cardiovascular;  Laterality: N/A;  . COLONOSCOPY    . CORONARY ARTERY BYPASS GRAFT     2000  . ESOPHAGOGASTRODUODENOSCOPY    . LEFT HEART CATHETERIZATION WITH CORONARY/GRAFT ANGIOGRAM N/A 08/01/2012   Procedure: LEFT HEART CATHETERIZATION WITH Beatrix Fetters;  Surgeon: [name], MD;  Location[facility] CATH LAB;  Service: Cardiovascular;  Laterality: N/A;  . LEFT HEART CATHETERIZATION WITH CORONARY/GRAFT ANGIOGRAM N/A 01/03/2015   Procedure: LEFT HEART CATHETERIZATION WITH Beatrix Fetters;  Surgeon: [name], MD;  Location: [facility] CATH LAB;  Service: Cardiovascular;  Laterality: N/A;  . NASAL SEPTUM SURGERY     UP3  . PERCUTANEOUS CORONARY STENT INTERVENTION (PCI-S)  10/2008   mRCA PCI  2.75 x 23 Abbott Xience V drug-eluting stent     Family History  Problem Relation Age of Onset  . Pancreatic cancer Brother   . Diabetes Brother   . Coronary artery disease Brother   . Stroke Brother   . Diabetes Brother   . Diabetes Mother   . Heart failure Mother   . Heart failure Father   . Hypothyroidism Brother   . Coronary artery disease Brother   . Other Brother        colon surgery  . Heart attack Unknown        Nephew  . Irregular heart beat Daughter   . Cancer Maternal Grandmother        unknown     Social History   Social History  . Marital status: Married    Spouse name: N/A  . Number of children: 4  . Years of education: 31   Occupational History  . retired    Social History Main Topics  . Smoking status: Never Smoker  . Smokeless tobacco: Never Used  . Alcohol use No     Comment: stopped drinking in 1998  . Drug use: No  . Sexual activity: Yes    Partners: Female   Other Topics Concern  . Not on file   Social History  Narrative   Patient is right handed.   Patient does not drink caffeine.    Outpatient Medications Prior to Visit  Medication Sig Dispense Refill  . albuterol (PROVENTIL HFA;VENTOLIN HFA) 108 (90 Base) MCG/ACT inhaler Inhale 2 puffs into the lungs every 6 (six) hours as needed for wheezing or shortness of breath. 1 Inhaler 0  . amiodarone (PACERONE) 400 MG tablet Take 200 mg by mouth daily.    Marland Kitchen atorvastatin (LIPITOR) 40 MG tablet Take 1.5 tablets (60 mg total) by mouth daily. 45 tablet 3  . diclofenac sodium (VOLTAREN) 1 % GEL Apply 2 g topically daily as needed (for pain in  affected area).    . divalproex (DEPAKOTE ER) 500 MG 24 hr tablet Take 1 tablet (500 mg total) by mouth daily. 30 tablet 2  . gabapentin (NEURONTIN) 300 MG capsule Take 2 capsules (600 mg) by mouth  every morning, 1 capsule (300 mg) by mouth  at noon, and 2 capsules (600 mg) by mouth at night    . glucose blood (ACCU-CHEK AVIVA) test strip Use as directed to check blood sugar 4 times daily.  DX E11.9 200 each 6  . insulin aspart (NOVOLOG) 100 UNIT/ML injection Inject 10-17 Units into the skin 3 (three) times daily with meals. Use 10-17 unit 3 times daily before meals 15 mL 4  . insulin NPH Human (HUMULIN N,NOVOLIN N) 100 UNIT/ML injection Inject 10-20 Units Twice Daily as instructed 10 mL 11  . isosorbide mononitrate (IMDUR) 60 MG 24 hr tablet TAKE TWO TABLETS BY MOUTH DAILY 180 tablet 0  . levothyroxine (SYNTHROID, LEVOTHROID) 88 MCG tablet TAKE 1 TABLET (88 MCG TOTAL) BY MOUTH DAILY BEFORE BREAKFAST. 30 tablet 6  . loperamide (IMODIUM A-D) 2 MG tablet Take 1 tablet (2 mg total) by mouth 4 (four) times daily as needed for diarrhea or loose stools. 30 tablet 0  . meclizine (ANTIVERT) 12.5 MG tablet Take 1 tablet (12.5 mg total) by mouth 3 (three) times daily as needed for dizziness. 10 tablet 0  . metFORMIN (GLUCOPHAGE) 1000 MG tablet TAKE 1 TABLET (1,000 MG TOTAL) BY MOUTH 2 (TWO) TIMES DAILY WITH A MEAL. 60 tablet 5  .  metoprolol succinate (TOPROL-XL) 50 MG 24 hr tablet Take 1 and a half tablets once a day for a total of 75 mg. (Patient taking differently: Take 75 mg by mouth daily. ) 45 tablet 6  . midodrine (PROAMATINE) 10 MG tablet TAKE 1 TABLET (10 MG TOTAL) BY MOUTH 3 TIMES DAILY. 90 tablet 9  . nitroGLYCERIN (NITROSTAT) 0.4 MG SL tablet Place 0.4 mg under the tongue every 5 (five) minutes as needed for chest pain.    Marland Kitchen ondansetron (ZOFRAN ODT) 4 MG disintegrating tablet Take 1 tablet (4 mg total) by mouth every 8 (eight) hours as needed for nausea or vomiting. 10 tablet 1  . pantoprazole (PROTONIX) 40 MG tablet TAKE 1 TABLET BY MOUTH TWICE DAILY FOR 2 WEEKS THEN DROP TO ONCE DAILY 45 tablet 5  . pramipexole (MIRAPEX) 1 MG tablet TAKE 1/2 TO 1 TABLET BY MOUTH AT BEDTIME 30 tablet 2  . sertraline (ZOLOFT) 100 MG tablet TAKE 1 TABLET (100 MG TOTAL) BY MOUTH 2 (TWO) TIMES DAILY. 180 tablet 1  . tiZANidine (ZANAFLEX) 4 MG tablet Take 1 tablet (4 mg total) by mouth every 8 (eight) hours as needed for muscle spasms. 90 tablet 2  . topiramate (TOPAMAX) 50 MG tablet TAKE 1 TABLET (50 MG) BY MOUTH AT BEDTIME. 30 tablet 3  . Vitamin D, Ergocalciferol, (DRISDOL) 50000 units CAPS capsule Take 1 capsule (50,000 Units total) by mouth every 7 (seven) days. 12 capsule 2  . warfarin (COUMADIN) 5 MG tablet TAKE AS DIRECTED BY COUMADIN CLINIC 35 tablet 3  . Insulin Glargine (TOUJEO SOLOSTAR) 300 UNIT/ML SOPN Inject 42 Units into the skin at bedtime. (Patient taking differently: Inject 48 Units into the skin at bedtime. ) 3 pen 4   No facility-administered medications prior to visit.     Allergies  Allergen Reactions  . Morphine Other (See Comments)    hallucinations  . Shellfish-Derived Products Itching    Review of Systems  Constitutional:  Positive for malaise/fatigue. Negative for fever.  HENT: Negative for congestion.   Eyes: Negative for blurred vision and discharge.  Respiratory: Negative for shortness of breath.    Cardiovascular: Negative for chest pain, palpitations and leg swelling.  Gastrointestinal: Negative for abdominal pain, blood in stool and nausea.  Genitourinary: Negative for dysuria and frequency.  Musculoskeletal: Positive for joint pain and neck pain. Negative for falls.  Skin: Negative for rash.  Neurological: Negative for dizziness, loss of consciousness and headaches.  Endo/Heme/Allergies: Negative for environmental allergies.  Psychiatric/Behavioral: Negative for depression. The patient is not nervous/anxious.        Objective:    Physical Exam  Constitutional: He is oriented to person, place, and time. He appears well-developed and well-nourished. No distress.  HENT:  Head: Normocephalic and atraumatic.  Nose: Nose normal.  Eyes: Right eye exhibits no discharge. Left eye exhibits no discharge.  Neck: Normal range of motion. Neck supple.  Cardiovascular: Normal rate and regular rhythm.   Pulmonary/Chest: Effort normal and breath sounds normal.  Abdominal: Soft. Bowel sounds are normal. There is no tenderness.  Musculoskeletal: He exhibits no edema.  Neurological: He is alert and oriented to person, place, and time.  Skin: Skin is warm and dry.  Psychiatric: He has a normal mood and affect.  Nursing note and vitals reviewed.   BP 120/68 (BP Location: Left Arm, Patient Position: Sitting, Cuff Size: Normal)   Pulse 68   Temp 98.3 F (36.8 C) (Oral)   Resp 18   Wt 178 lb 3.2 oz (80.8 kg)   SpO2 98%   BMI 26.32 kg/m  Wt Readings from Last 3 Encounters:  05/17/17 178 lb 3.2 oz (80.8 kg)  04/21/17 181 lb (82.1 kg)  04/21/17 181 lb (82.1 kg)   BP Readings from Last 3 Encounters:  05/17/17 120/68  04/21/17 (!) 114/58  03/28/17 136/80     Immunization History  Administered Date(s) Administered  . Influenza Split 09/21/2012  . Influenza Whole 10/16/2007, 10/01/2008, 09/23/2009, 09/22/2010  . Influenza,inj,Quad PF,36+ Mos 08/22/2015, 08/22/2015, 08/24/2016  .  Influenza-Unspecified 09/04/2014  . Pneumococcal Conjugate-13 08/24/2016  . Pneumococcal Polysaccharide-23 12/18/2008, 01/03/2015  . Td 12/18/2007  . Zoster 11/25/2010, 11/30/2012    Health Maintenance  Topic Date Due  . Hepatitis C Screening  11-Jan-1946  . OPHTHALMOLOGY EXAM  06/20/2012  . URINE MICROALBUMIN  04/30/2016  . INFLUENZA VACCINE  07/06/2017  . HEMOGLOBIN A1C  11/16/2017  . TETANUS/TDAP  12/17/2017  . FOOT EXAM  03/17/2018  . COLONOSCOPY  02/11/2019  . PNA vac Low Risk Adult  Completed    Lab Results  Component Value Date   WBC 8.1 05/17/2017   HGB 13.4 05/17/2017   HCT 39.2 05/17/2017   PLT 195.0 05/17/2017   GLUCOSE 239 (H) 05/17/2017   CHOL 71 05/17/2017   TRIG 151.0 (H) 05/17/2017   HDL 26.50 (L) 05/17/2017   LDLDIRECT 25.0 02/09/2016   LDLCALC 14 05/17/2017   ALT 20 05/17/2017   AST 20 05/17/2017   NA 141 05/17/2017   K 4.4 05/17/2017   CL 103 05/17/2017   CREATININE 1.04 05/17/2017   BUN 13 05/17/2017   CO2 29 05/17/2017   TSH 2.90 05/17/2017   PSA 2.39 11/25/2010   INR 2.3 05/19/2017   HGBA1C 9.7 (H) 05/17/2017   MICROALBUR 1.1 05/01/2015    Lab Results  Component Value Date   TSH 2.90 05/17/2017   Lab Results  Component Value Date   WBC 8.1 05/17/2017   HGB  13.4 05/17/2017   HCT 39.2 05/17/2017   MCV 84.7 05/17/2017   PLT 195.0 05/17/2017   Lab Results  Component Value Date   NA 141 05/17/2017   K 4.4 05/17/2017   CO2 29 05/17/2017   GLUCOSE 239 (H) 05/17/2017   BUN 13 05/17/2017   CREATININE 1.04 05/17/2017   BILITOT 0.4 05/17/2017   ALKPHOS 66 05/17/2017   AST 20 05/17/2017   ALT 20 05/17/2017   PROT 7.1 05/17/2017   ALBUMIN 4.4 05/17/2017   CALCIUM 9.8 05/17/2017   ANIONGAP 11 02/13/2017   GFR 75.33 05/17/2017   Lab Results  Component Value Date   CHOL 71 05/17/2017   Lab Results  Component Value Date   HDL 26.50 (L) 05/17/2017   Lab Results  Component Value Date   LDLCALC 14 05/17/2017   Lab Results    Component Value Date   TRIG 151.0 (H) 05/17/2017   Lab Results  Component Value Date   CHOLHDL 3 05/17/2017   Lab Results  Component Value Date   HGBA1C 9.7 (H) 05/17/2017         Assessment & Plan:   Problem List Items Addressed This Visit    Essential hypertension (Chronic)    Well controlled, no changes to meds. Encouraged heart healthy diet such as the DASH diet and exercise as tolerated.       Relevant Orders   CBC (Completed)   Comprehensive metabolic panel (Completed)   TSH (Completed)   Diabetes mellitus type II, uncontrolled (HCC) (Chronic)     minimize simple carbs. Increase exercise as tolerated. Continue current meds for now he is seeing improved numbers lately with fasting numbers usually between 100 to 120s since he got on a regular regimen with Tuojeo. He is asked to consider a referral to endocrinology.       Relevant Medications   Insulin Glargine (TOUJEO SOLOSTAR) 300 UNIT/ML SOPN   Other Relevant Orders   Hemoglobin A1c (Completed)   Hypothyroidism    On Levothyroxine, continue to monitor      Pain in both knees    xrays of both knees performed today shows no significant acute or degenerative changes in his knees, encouraged to stay as active as able. Use topical treatments and consider sports med referral worsens      Relevant Orders   DG Knee Complete 4 Views Left (Completed)   DG Knee Complete 4 Views Right (Completed)   Spinal stenosis in cervical region    With chronic neck pain. Encouraged moist heat and gentle stretching as tolerated. May try NSAIDs and prescription meds as directed and report if symptoms worsen or seek immediate care.       HLD (hyperlipidemia) - Primary   Relevant Orders   Lipid panel (Completed)   Headache   Relevant Medications   Insulin Glargine (TOUJEO SOLOSTAR) 300 UNIT/ML SOPN   Chronic diastolic CHF (congestive heart failure) (HCC)    No recent exacerbation      Atrial fibrillation (HCC) [I48.91]    Rate  controlled and tolerating coumadin      Vitamin D deficiency    Encouraged daily supplementation      Relevant Orders   VITAMIN D 25 Hydroxy (Vit-D Deficiency, Fractures) (Completed)    Other Visit Diagnoses    Neck pain       Relevant Medications   Insulin Glargine (TOUJEO SOLOSTAR) 300 UNIT/ML SOPN   Paresthesias/numbness       Relevant Medications   Insulin Glargine (TOUJEO SOLOSTAR)  300 UNIT/ML SOPN      I have changed Mr. Osei Insulin Glargine. I am also having him maintain his tiZANidine, glucose blood, gabapentin, insulin aspart, diclofenac sodium, nitroGLYCERIN, pantoprazole, topiramate, metoprolol succinate, meclizine, albuterol, divalproex, atorvastatin, metFORMIN, ondansetron, loperamide, insulin NPH Human, Vitamin D (Ergocalciferol), warfarin, amiodarone, levothyroxine, isosorbide mononitrate, pramipexole, sertraline, and midodrine.  Meds ordered this encounter  Medications  . Insulin Glargine (TOUJEO SOLOSTAR) 300 UNIT/ML SOPN    Sig: Inject 48 Units into the skin at bedtime.    Dispense:  3 pen    Refill:  4    CMA served as scribe during this visit. History, Physical and Plan performed by medical provider. Documentation and orders reviewed and attested to.  Penni Homans, MD

## 2017-05-17 NOTE — Assessment & Plan Note (Addendum)
minimize simple carbs. Increase exercise as tolerated. Continue current meds for now he is seeing improved numbers lately with fasting numbers usually between 100 to 120s since he got on a regular regimen with Tuojeo. He is asked to consider a referral to endocrinology.

## 2017-05-17 NOTE — Assessment & Plan Note (Signed)
Rate controlled and tolerating coumadin 

## 2017-05-17 NOTE — Patient Instructions (Signed)

## 2017-05-17 NOTE — Assessment & Plan Note (Signed)
On Levothyroxine, continue to monitor 

## 2017-05-17 NOTE — Assessment & Plan Note (Signed)
No recent exacerbation 

## 2017-05-17 NOTE — Assessment & Plan Note (Signed)
Well controlled, no changes to meds. Encouraged heart healthy diet such as the DASH diet and exercise as tolerated.  °

## 2017-05-18 ENCOUNTER — Ambulatory Visit (HOSPITAL_BASED_OUTPATIENT_CLINIC_OR_DEPARTMENT_OTHER)
Admission: RE | Admit: 2017-05-18 | Discharge: 2017-05-18 | Disposition: A | Discharge status: Home / Self Care | Payer: Medicare PPO | Source: Ambulatory Visit | Attending: Family Medicine | Admitting: Family Medicine

## 2017-05-18 DIAGNOSIS — M25561 Pain in right knee: Secondary | ICD-10-CM

## 2017-05-18 DIAGNOSIS — I7090 Unspecified atherosclerosis: Secondary | ICD-10-CM | POA: Diagnosis not present

## 2017-05-18 DIAGNOSIS — Z9889 Other specified postprocedural states: Secondary | ICD-10-CM | POA: Diagnosis not present

## 2017-05-18 DIAGNOSIS — M25861 Other specified joint disorders, right knee: Secondary | ICD-10-CM | POA: Insufficient documentation

## 2017-05-18 DIAGNOSIS — M25862 Other specified joint disorders, left knee: Secondary | ICD-10-CM | POA: Insufficient documentation

## 2017-05-18 DIAGNOSIS — M25562 Pain in left knee: Secondary | ICD-10-CM | POA: Diagnosis not present

## 2017-05-18 LAB — COMPREHENSIVE METABOLIC PANEL
ALBUMIN: 4.4 g/dL (ref 3.5–5.2)
ALK PHOS: 66 U/L (ref 39–117)
ALT: 20 U/L (ref 0–53)
AST: 20 U/L (ref 0–37)
BUN: 13 mg/dL (ref 6–23)
CO2: 29 mEq/L (ref 19–32)
Calcium: 9.8 mg/dL (ref 8.4–10.5)
Chloride: 103 mEq/L (ref 96–112)
Creatinine, Ser: 1.04 mg/dL (ref 0.40–1.50)
GFR: 75.33 mL/min (ref >60.00)
Glucose, Bld: 239 mg/dL — ABNORMAL HIGH (ref 70–99)
Potassium: 4.4 mEq/L (ref 3.5–5.1)
Sodium: 141 mEq/L (ref 135–145)
TOTAL PROTEIN: 7.1 g/dL (ref 6.0–8.3)
Total Bilirubin: 0.4 mg/dL (ref 0.2–1.2)

## 2017-05-18 LAB — LIPID PANEL
CHOLESTEROL: 71 mg/dL (ref 0–200)
HDL: 26.5 mg/dL — ABNORMAL LOW (ref >39.00)
LDL Cholesterol: 14 mg/dL (ref 0–99)
NonHDL: 44.41
Total CHOL/HDL Ratio: 3
Triglycerides: 151 mg/dL — ABNORMAL HIGH (ref 0.0–149.0)
VLDL: 30.2 mg/dL (ref 0.0–40.0)

## 2017-05-18 LAB — CBC
HCT: 39.2 % (ref 39.0–52.0)
HEMOGLOBIN: 13.4 g/dL (ref 13.0–17.0)
MCHC: 34.1 g/dL (ref 30.0–36.0)
MCV: 84.7 fl (ref 78.0–100.0)
Platelets: 195 10*3/uL (ref 150.0–400.0)
RBC: 4.63 Mil/uL (ref 4.22–5.81)
RDW: 15.6 % — ABNORMAL HIGH (ref 11.5–15.5)
WBC: 8.1 10*3/uL (ref 4.0–10.5)

## 2017-05-18 LAB — HEMOGLOBIN A1C: Hgb A1c MFr Bld: 9.7 % — ABNORMAL HIGH (ref 4.6–6.5)

## 2017-05-18 LAB — TSH: TSH: 2.9 u[IU]/mL (ref 0.35–4.50)

## 2017-05-18 LAB — VITAMIN D 25 HYDROXY (VIT D DEFICIENCY, FRACTURES): VITD: 49.37 ng/mL (ref 30.00–100.00)

## 2017-05-19 ENCOUNTER — Ambulatory Visit (INDEPENDENT_AMBULATORY_CARE_PROVIDER_SITE_OTHER): Payer: Medicare PPO | Admitting: *Deleted

## 2017-05-19 DIAGNOSIS — I480 Paroxysmal atrial fibrillation: Secondary | ICD-10-CM | POA: Diagnosis not present

## 2017-05-19 DIAGNOSIS — Z5181 Encounter for therapeutic drug level monitoring: Secondary | ICD-10-CM | POA: Diagnosis not present

## 2017-05-19 DIAGNOSIS — Z7901 Long term (current) use of anticoagulants: Secondary | ICD-10-CM | POA: Diagnosis not present

## 2017-05-19 DIAGNOSIS — I4891 Unspecified atrial fibrillation: Secondary | ICD-10-CM | POA: Diagnosis not present

## 2017-05-19 LAB — POCT INR: INR: 2.3

## 2017-05-22 NOTE — Assessment & Plan Note (Signed)
Encouraged daily supplementation

## 2017-05-22 NOTE — Assessment & Plan Note (Signed)
xrays of both knees performed today shows no significant acute or degenerative changes in his knees, encouraged to stay as active as able. Use topical treatments and consider sports med referral worsens

## 2017-05-22 NOTE — Assessment & Plan Note (Signed)
With chronic neck pain. Encouraged moist heat and gentle stretching as tolerated. May try NSAIDs and prescription meds as directed and report if symptoms worsen or seek immediate care.

## 2017-05-25 ENCOUNTER — Other Ambulatory Visit: Payer: Self-pay | Admitting: Family Medicine

## 2017-05-25 DIAGNOSIS — R519 Headache, unspecified: Secondary | ICD-10-CM

## 2017-05-25 DIAGNOSIS — R209 Unspecified disturbances of skin sensation: Secondary | ICD-10-CM

## 2017-05-25 DIAGNOSIS — IMO0001 Reserved for inherently not codable concepts without codable children: Secondary | ICD-10-CM

## 2017-05-25 DIAGNOSIS — R51 Headache: Secondary | ICD-10-CM

## 2017-05-25 DIAGNOSIS — M542 Cervicalgia: Secondary | ICD-10-CM

## 2017-05-25 MED FILL — TOUJEO SOLOSTAR 300 UNITS/M: 300 | 21 days supply | Qty: 3 | Fill #0

## 2017-05-25 MED FILL — WARFARIN SODIUM 5 MG TABLET: 5 | 30 days supply | Qty: 35 | Fill #2

## 2017-05-27 ENCOUNTER — Telehealth: Payer: Self-pay | Admitting: Neurology

## 2017-05-27 NOTE — Telephone Encounter (Signed)
Note faxed by Magda Paganini to Dr. Wilmon Arms for patient to be seen again for pain management of back pain. They were to call patient to schedule. Faxed on 04/21/17 with confirmation received.

## 2017-06-01 ENCOUNTER — Other Ambulatory Visit: Payer: Self-pay | Admitting: Neurology

## 2017-06-01 MED FILL — PRAMIPEXOLE 1 MG TABLET: 1 | 30 days supply | Qty: 30 | Fill #1

## 2017-06-01 MED FILL — DIVALPROEX SOD ER 500 MG TA: 500 | 30 days supply | Qty: 30 | Fill #0

## 2017-06-01 MED FILL — ATORVASTATIN 40 MG TABLET: 40 | 30 days supply | Qty: 45 | Fill #3

## 2017-06-01 MED FILL — METOPROLOL SUCC ER 50 MG TA: 50 | 30 days supply | Qty: 45 | Fill #6

## 2017-06-01 MED FILL — LEVOTHYROXINE 88 MCG TABLET: 88 | 30 days supply | Qty: 30 | Fill #2

## 2017-06-01 MED FILL — metFORMIN HCL 1000 MG TABS: 1000 | 30 days supply | Qty: 60 | Fill #3

## 2017-06-03 DIAGNOSIS — G4733 Obstructive sleep apnea (adult) (pediatric): Secondary | ICD-10-CM | POA: Diagnosis not present

## 2017-06-06 ENCOUNTER — Ambulatory Visit (INDEPENDENT_AMBULATORY_CARE_PROVIDER_SITE_OTHER): Payer: Medicare PPO | Admitting: Pharmacist

## 2017-06-06 DIAGNOSIS — Z7901 Long term (current) use of anticoagulants: Secondary | ICD-10-CM

## 2017-06-06 DIAGNOSIS — Z5181 Encounter for therapeutic drug level monitoring: Secondary | ICD-10-CM | POA: Diagnosis not present

## 2017-06-06 DIAGNOSIS — I4891 Unspecified atrial fibrillation: Secondary | ICD-10-CM | POA: Diagnosis not present

## 2017-06-06 DIAGNOSIS — I480 Paroxysmal atrial fibrillation: Secondary | ICD-10-CM | POA: Diagnosis not present

## 2017-06-06 LAB — POCT INR: INR: 2.2

## 2017-06-10 ENCOUNTER — Telehealth: Payer: Self-pay | Admitting: *Deleted

## 2017-06-10 NOTE — Telephone Encounter (Signed)
Pt called stating that he missed dose of his coumadin last night and and needs to know what he should do regarding dosing. Pt instructed to take extra 1/2 tablet (2.5mg ) of coumadin today July 6th  and then continue taking same dose of coumadin as instructed on visit of July 2nd Coumadin 5mg  daily except 7.5mg  on Mondays and Fridays and he states understanding

## 2017-06-24 DIAGNOSIS — Z794 Long term (current) use of insulin: Secondary | ICD-10-CM | POA: Diagnosis not present

## 2017-06-24 DIAGNOSIS — R51 Headache: Secondary | ICD-10-CM | POA: Diagnosis not present

## 2017-06-24 DIAGNOSIS — M4722 Other spondylosis with radiculopathy, cervical region: Secondary | ICD-10-CM | POA: Diagnosis not present

## 2017-06-24 DIAGNOSIS — E1140 Type 2 diabetes mellitus with diabetic neuropathy, unspecified: Secondary | ICD-10-CM | POA: Diagnosis not present

## 2017-06-24 MED FILL — TOPIRAMATE 25 MG TABLET: 25 | 30 days supply | Qty: 70 | Fill #0

## 2017-06-29 ENCOUNTER — Other Ambulatory Visit: Payer: Self-pay | Admitting: Family Medicine

## 2017-06-29 MED FILL — VIT D2 1.25 MG (50,000 UNIT: 1.25 MG | 84 days supply | Qty: 12 | Fill #1

## 2017-06-29 MED FILL — WARFARIN SODIUM 5 MG TABLET: 5 | 30 days supply | Qty: 35 | Fill #3

## 2017-06-29 MED FILL — TOUJEO SOLOSTAR 300 UNITS/M: 300 | 21 days supply | Qty: 3 | Fill #1

## 2017-06-29 MED FILL — PANTOPRAZOLE SOD DR 40 MG T: 40 | 45 days supply | Qty: 45 | Fill #5

## 2017-06-29 MED FILL — MIDODRINE HCL 10 MG TABLET: 10 | 30 days supply | Qty: 90 | Fill #1

## 2017-06-29 MED FILL — PRAMIPEXOLE 1 MG TABLET: 1 | 30 days supply | Qty: 30 | Fill #2

## 2017-06-30 ENCOUNTER — Telehealth: Payer: Self-pay | Admitting: *Deleted

## 2017-06-30 MED FILL — ATORVASTATIN 40 MG TABLET: 40 | 30 days supply | Qty: 45 | Fill #0

## 2017-06-30 NOTE — Telephone Encounter (Signed)
Pt calls to inform us that he has not taken any of his Warfarin from Sunday until today. He states he has been very sick; HAs and N&V. He states that he is feeling better, as those symptoms have resolved.  He has been following up with Neuro and was recently prescribed Topamax. He was instructed to restart Warfarin today with any extra 1/2 tablet today and tomorrow, then resume his regular dosage. Thus, rescheduled his appt from Monday to Thursday.

## 2017-07-01 IMAGING — MR MR HEAD WO/W CM
9 of 10 series · 37 of 48 positions shown · IV contrast (15ml Multihance)
Comparison: MRI head 10/15/2009.  CT head 03/28/2015

CLINICAL DATA: Disorientation. Memory difficulty. Acute confusional
state

Creatinine was obtained on site at [HOSPITAL] at [HOSPITAL].
Results: Creatinine 0.9 mg/dL.
EXAM:
MRI HEAD WITHOUT AND WITH CONTRAST
TECHNIQUE: Multiplanar, multiecho pulse sequences of the brain and surrounding
structures were obtained without and with intravenous contrast.
CONTRAST:  15mL MULTIHANCE GADOBENATE DIMEGLUMINE 529 MG/ML IV SOLN

[Series 2: T1 · sagittal · 5.0mm · 0.45mm/px · 3 of 19 slices shown (1 of 2)]
[im 1/19]
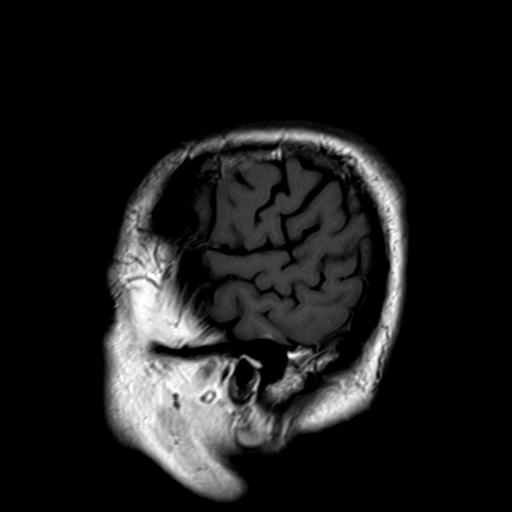
[im 10/19]
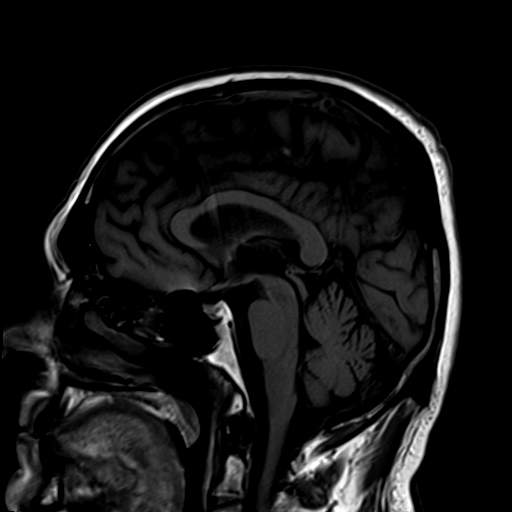
[im 19/19]
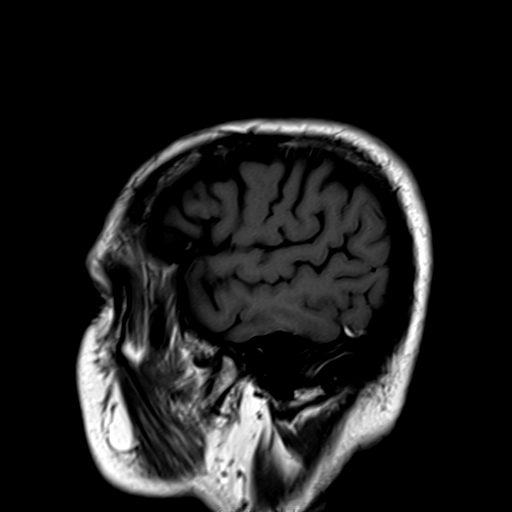

[Series 3: ep2d_diff_(id)_trace · axial · 3.0mm · 1.80mm/px · z∈[-76,+71]mm · 8 of 100 slices shown]
[im 1/100]
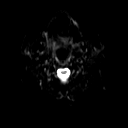
[im 20/100]
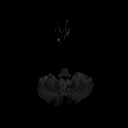
[im 30/100]
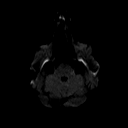
[im 40/100]
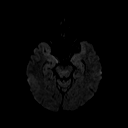
[im 60/100]
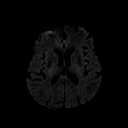
[im 70/100]
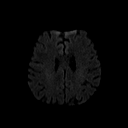
[im 80/100]
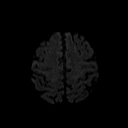
[im 100/100]
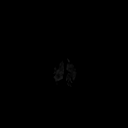

[Series 4: ep2d_diff_(id)_trace_adc · axial · 3.0mm · 1.80mm/px · z∈[-76,+71]mm · 5 of 50 slices shown]
[im 1/50]
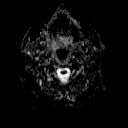
[im 13/50]
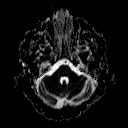
[im 25/50]
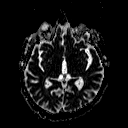
[im 37/50]
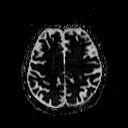
[im 50/50]
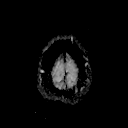

[Series 6: swi_images · axial · 2.0mm · 0.90mm/px · z∈[-74,+68]mm · 7 of 72 slices shown]
[im 1/72]
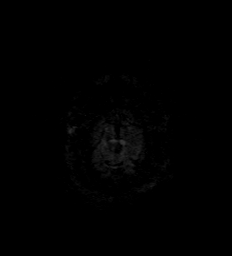
[im 12/72]
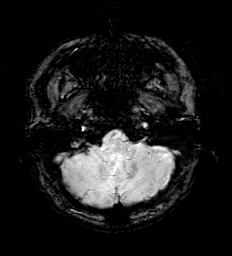
[im 24/72]
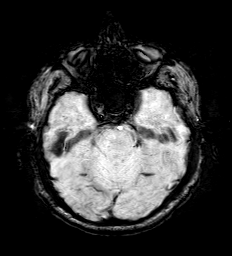
[im 36/72]
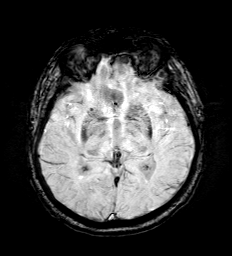
[im 48/72]
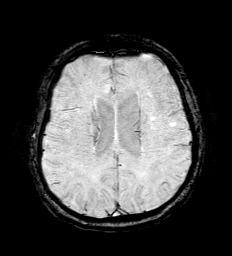
[im 60/72]
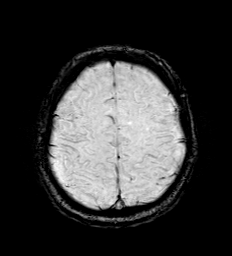
[im 72/72]
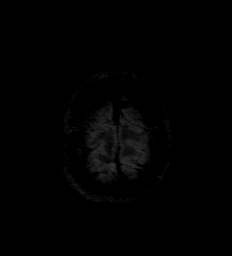

[Series 7: FLAIR · axial · 5.0mm · 0.90mm/px · z∈[-78,+72]mm · 2 of 24 slices shown]
[im 1/24]
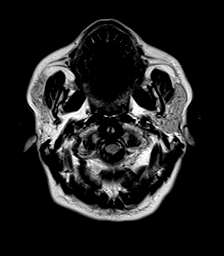
[im 24/24]
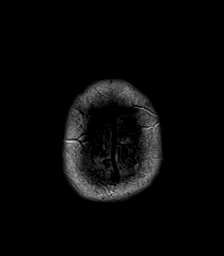

[Series 8: T2 · axial · 5.0mm · 0.30mm/px · z∈[-78,+72]mm · 2 of 24 slices shown (1 of 2)]
[im 1/24]
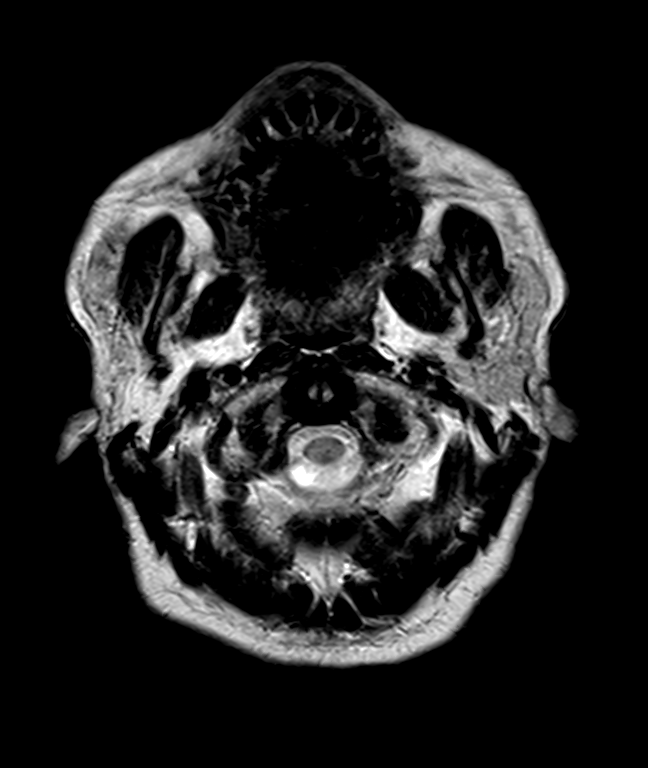
[im 24/24]
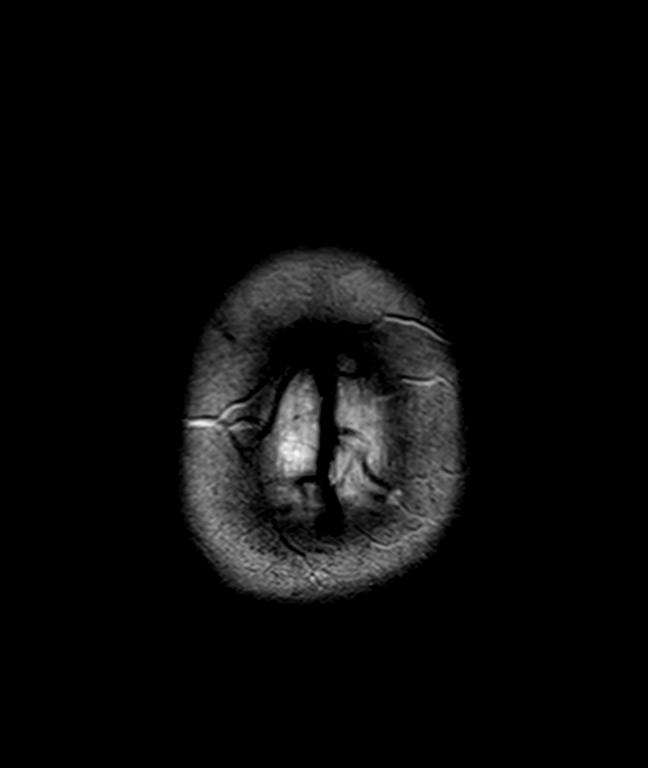

[Series 9: T1 · axial · 2.0mm · 0.45mm/px · z∈[-74,+68]mm · 7 of 72 slices shown (2 of 2)]
[im 1/72]
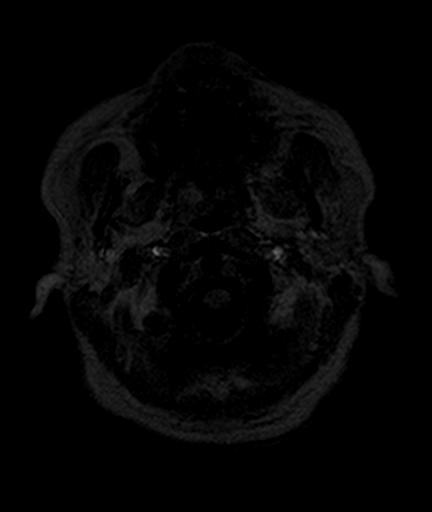
[im 12/72]
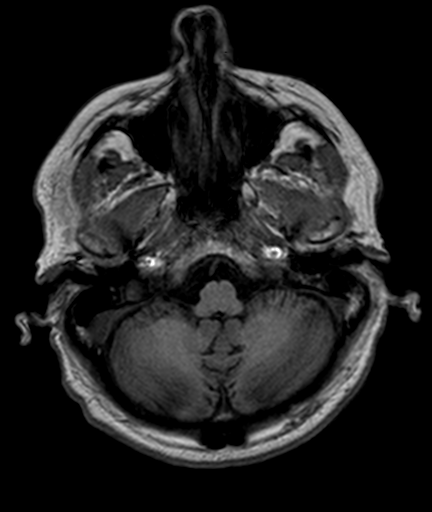
[im 24/72]
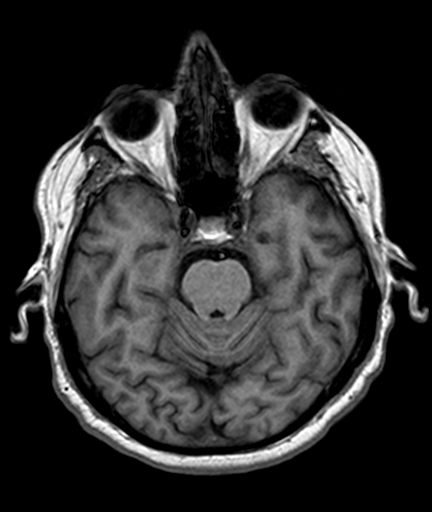
[im 36/72]
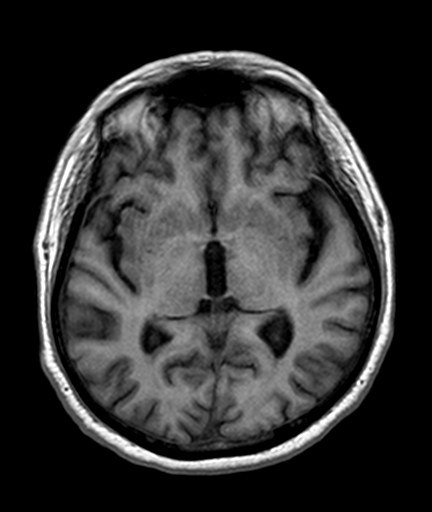
[im 48/72]
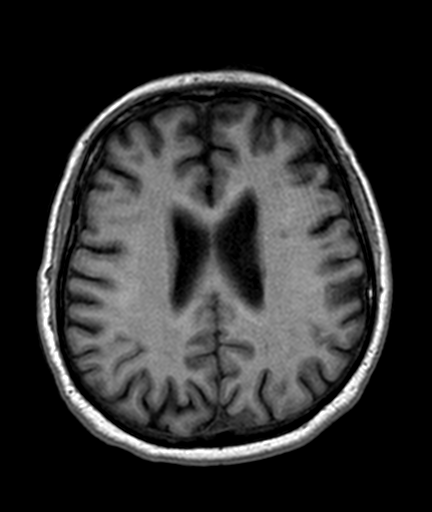
[im 60/72]
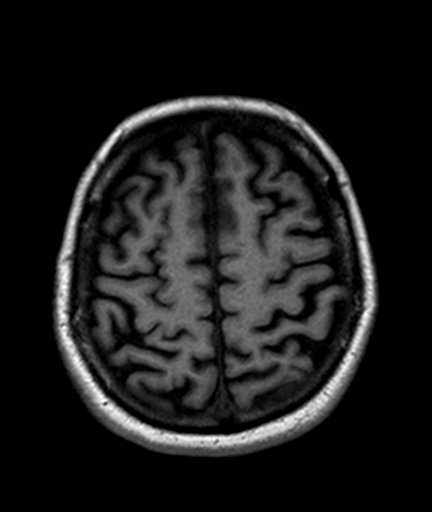
[im 72/72]
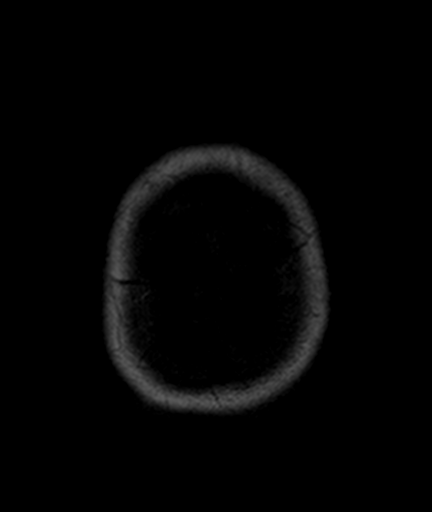

[Series 10: T2 · coronal · 5.0mm · 0.45mm/px · 2 of 22 slices shown (2 of 2)]
[im 1/22]
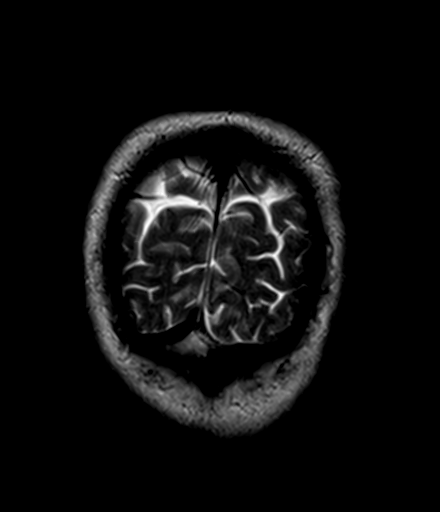
[im 22/22]
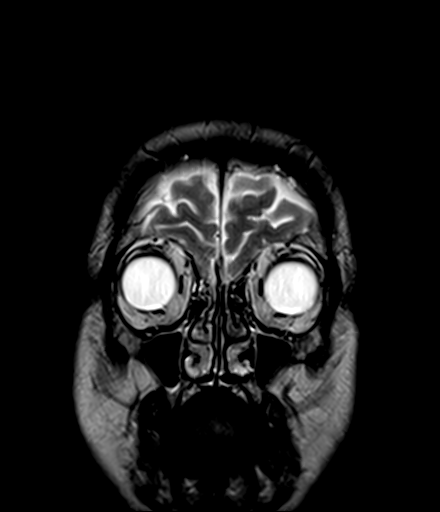

[Series 11: axial post · axial · 2.0mm · 0.45mm/px · 1 of 72 slices shown]
[im 1/72]
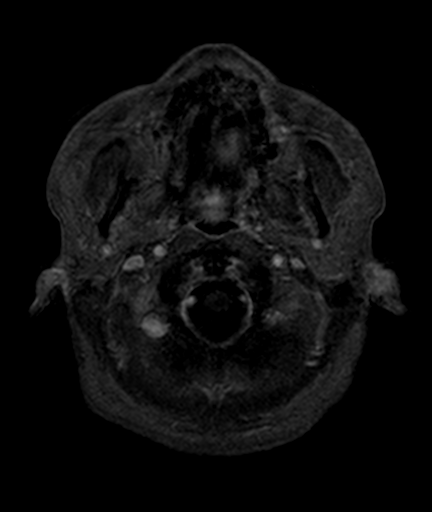

[37 of 48 positions shown; findings below may reference images not displayed]

FINDINGS: Mild atrophy, typical for age. Mild progression since 6848. Negative
for hydrocephalus

Negative for acute infarct. Chronic microvascular ischemic change
with progression since 6848. Patchy small white matter
hyperintensities bilaterally. Basal ganglia and brainstem normal.

Negative for intracranial hemorrhage.

Negative for mass or edema. Normal enhancement following contrast
infusion. No enhancing mass lesion. Venous enhancement is normal.

Paranasal sinuses clear.  Normal orbit.  Pituitary not enlarged.
IMPRESSION: Progression of mild atrophy and mild chronic microvascular ischemic
change since 6848. No acute abnormality

## 2017-07-03 DIAGNOSIS — G4733 Obstructive sleep apnea (adult) (pediatric): Secondary | ICD-10-CM | POA: Diagnosis not present

## 2017-07-05 ENCOUNTER — Ambulatory Visit (INDEPENDENT_AMBULATORY_CARE_PROVIDER_SITE_OTHER): Payer: Medicare PPO | Admitting: Family Medicine

## 2017-07-05 ENCOUNTER — Encounter: Payer: Self-pay | Admitting: Family Medicine

## 2017-07-05 DIAGNOSIS — R51 Headache: Secondary | ICD-10-CM | POA: Diagnosis not present

## 2017-07-05 DIAGNOSIS — R519 Headache, unspecified: Secondary | ICD-10-CM

## 2017-07-05 NOTE — Progress Notes (Signed)
Subjective:  I acted as a Education administrator for Dr. Charlett Blake. [name] [name], [name] [name]   Patient ID: [name] [name], male    DOB: 03-Apr-1946, 71 y.o.   MRN: [id number]  No chief complaint on file.   HPI  Patient is in today for 6 week follow-up.  Patient is following up with hyperlipidemia and other medical concern.  Presently C/O diarrhea for 2 months No recent febrile illness or acute hospitalizations. Denies CP/palp/SOB/HA/congestion/fevers/GI or GU c/o. Taking meds as prescribed. His headaches are much better on Topamax. He has an appt with endocrinologist at [facility] in January but his sugars are much better, ranging form 74 to 200 recently usually in low 100s. No polyuria or polydipsia.    Patient Care Team: [name], MD as PCP - General (Family Medicine) Pieter Partridge, DO as Consulting Physician (Neurology) [name], MD as Consulting Physician (Cardiology) Deboraha Sprang, MD as Consulting Physician (Cardiology) Hallett, Mike Gip, MD as Consulting Physician (Pulmonary Disease) Warden Fillers, MD as Consulting Physician (Ophthalmology)   Past Medical History:  Diagnosis Date  . ACP (advance care planning) 05/11/2015  . Advance care planning 05/01/2015  . Anemia    hemoglobin 7.4, iron deficiency, January, 2011, 2 unit transfusion, endoscopy normal, capsule endoscopy February, 2011 no small bowel abnormalities.   Most likely source gastric erosions, followed by GI  . Anxiety   . CAD (coronary artery disease)    A. CABG in 2000,status post cardiac cath in 2006, 2009 ....continued chest pain and SOB despite oral medication adjestments including Ranexa. B. Cath November 2009/ mRCA - 2.75 x 23 Abbott Xience V drug-eluting stent ...11/26/2008 to distal  RCA leading to acute marginal.  C. Cath 07/2012 for CP - stable anatomy, med rx. d. cath 2015 and 05/30/2015 stable anatomy, consider Myoview if has CP again  . Cerebral ischemia    MRI November, 2010, chronic microvascular ischemia  .  Chronic diastolic CHF (congestive heart failure) ([place]) 03/13/2016  . Depression    Bipolar  . Diabetes mellitus type II, uncontrolled ([place])   . Diabetes mellitus without complication ([place])   . Diarrhea 02/15/2016  . Dizziness   . Edema   . Ejection fraction    EF 60%, echo, July 31, 2012  . Falling episodes    these have occurred in the past and again recurring 2011  . Family history of adverse reaction to anesthesia    "mother died during bypass surgery but not sure if it has to do with anesthesia"  . Gastric ulcer   . GERD (gastroesophageal reflux disease)   . H/O medication noncompliance    Due to loss of insurance  . Hard of hearing   . Headache 01/25/2016  . Hx of CABG    2000,  / one median sternotomy suture broken her chest x-ray November, 2010, no clinical significance  . Hyperlipidemia   . Hypertension    pt. denies  . Iron deficiency anemia   . Low back pain 06/12/2009   Qualifier: Diagnosis of  By: [name]   . Nausea with vomiting 01/25/2016  . Nephrolithiasis   . OSA (obstructive sleep apnea)   . Pain in limb 06/12/2009   Qualifier: Diagnosis of  By: [name]   . Palpitations    event recorder showed sinus rhythm  . RBBB (right bundle branch block)   . Restless leg   . Right knee pain 01/07/2015  . Shortness of breath  CPX April, 2011, mild functional limitation, no clear pulmonary or cardiac limitation, possible deconditioning and mild chronotropic incompetence( peak heart rate 130)  . Spondylosis    C5-6, C6-7 MRI 2010  . Syncope 03/2016  . Thyroid disease   . Tubulovillous adenoma of colon 2007  . Vitamin D deficiency 05/17/2017  . Wears glasses     Past Surgical History:  Procedure Laterality Date  . ANTERIOR CERVICAL DECOMP/DISCECTOMY FUSION N/A 05/31/2016   Procedure: ANTERIOR CERVICAL DECOMPRESSION/DISCECTOMY FUSION CERVICAL FIVE-SIX,CERVICAL SIX-SEVEN;  Surgeon: [name], MD;  Location: [facility];  Service: Neurosurgery;   Laterality: N/A;  . CARDIAC CATHETERIZATION N/A 05/30/2015   Procedure: Left Heart Cath and Coronary Angiography;  Surgeon: [name], MD;  Location: [facility];  Service: Cardiovascular;  Laterality: N/A;  . COLONOSCOPY    . CORONARY ARTERY BYPASS GRAFT     2000  . ESOPHAGOGASTRODUODENOSCOPY    . LEFT HEART CATHETERIZATION WITH CORONARY/GRAFT ANGIOGRAM N/A 08/01/2012   Procedure: LEFT HEART CATHETERIZATION WITH Beatrix Fetters;  Surgeon: [name], MD;  Location: [facility] CATH LAB;  Service: Cardiovascular;  Laterality: N/A;  . LEFT HEART CATHETERIZATION WITH CORONARY/GRAFT ANGIOGRAM N/A 01/03/2015   Procedure: LEFT HEART CATHETERIZATION WITH Beatrix Fetters;  Surgeon: [name], MD;  Location: [facility] CATH LAB;  Service: Cardiovascular;  Laterality: N/A;  . NASAL SEPTUM SURGERY     UP3  . PERCUTANEOUS CORONARY STENT INTERVENTION (PCI-S)  10/2008   mRCA PCI  2.75 x 23 Abbott Xience V drug-eluting stent     Family History  Problem Relation Age of Onset  . Pancreatic cancer Brother   . Diabetes Brother   . Coronary artery disease Brother   . Stroke Brother   . Diabetes Brother   . Diabetes Mother   . Heart failure Mother   . Heart failure Father   . Hypothyroidism Brother   . Coronary artery disease Brother   . Other Brother        colon surgery  . Heart attack Unknown        Nephew  . Irregular heart beat Daughter   . Cancer Maternal Grandmother        unknown     Social History   Social History  . Marital status: Married    Spouse name: N/A  . Number of children: 4  . Years of education: 31   Occupational History  . retired    Social History Main Topics  . Smoking status: Never Smoker  . Smokeless tobacco: Never Used  . Alcohol use No     Comment: stopped drinking in 1998  . Drug use: No  . Sexual activity: Yes    Partners: Female   Other Topics Concern  . Not on file   Social History Narrative   Patient is right handed.     Patient does not drink caffeine.    Outpatient Medications Prior to Visit  Medication Sig Dispense Refill  . albuterol (PROVENTIL HFA;VENTOLIN HFA) 108 (90 Base) MCG/ACT inhaler Inhale 2 puffs into the lungs every 6 (six) hours as needed for wheezing or shortness of breath. 1 Inhaler 0  . amiodarone (PACERONE) 400 MG tablet Take 200 mg by mouth daily.    Marland Kitchen atorvastatin (LIPITOR) 40 MG tablet TAKE 1 & 1/2 TABLETS (60 MG TOTAL) BY MOUTH DAILY. 45 tablet 3  . diclofenac sodium (VOLTAREN) 1 % GEL Apply 2 g topically daily as needed (for pain in affected area).    Marland Kitchen  divalproex (DEPAKOTE ER) 500 MG 24 hr tablet TAKE 1 TABLET (500 MG TOTAL) BY MOUTH DAILY. 30 tablet 2  . gabapentin (NEURONTIN) 300 MG capsule Take 2 capsules (600 mg) by mouth  every morning, 1 capsule (300 mg) by mouth  at noon, and 2 capsules (600 mg) by mouth at night    . glucose blood (ACCU-CHEK AVIVA) test strip Use as directed to check blood sugar 4 times daily.  DX E11.9 200 each 6  . insulin aspart (NOVOLOG) 100 UNIT/ML injection Inject 10-17 Units into the skin 3 (three) times daily with meals. Use 10-17 unit 3 times daily before meals 15 mL 4  . Insulin Glargine (TOUJEO SOLOSTAR) 300 UNIT/ML SOPN Inject 48 Units into the skin at bedtime. 3 pen 4  . insulin NPH Human (HUMULIN N,NOVOLIN N) 100 UNIT/ML injection Inject 10-20 Units Twice Daily as instructed 10 mL 11  . isosorbide mononitrate (IMDUR) 60 MG 24 hr tablet TAKE TWO TABLETS BY MOUTH DAILY 180 tablet 0  . levothyroxine (SYNTHROID, LEVOTHROID) 88 MCG tablet TAKE 1 TABLET (88 MCG TOTAL) BY MOUTH DAILY BEFORE BREAKFAST. 30 tablet 6  . loperamide (IMODIUM A-D) 2 MG tablet Take 1 tablet (2 mg total) by mouth 4 (four) times daily as needed for diarrhea or loose stools. 30 tablet 0  . meclizine (ANTIVERT) 12.5 MG tablet Take 1 tablet (12.5 mg total) by mouth 3 (three) times daily as needed for dizziness. 10 tablet 0  . metFORMIN (GLUCOPHAGE) 1000 MG tablet TAKE 1 TABLET (1,000  MG TOTAL) BY MOUTH 2 (TWO) TIMES DAILY WITH A MEAL. 60 tablet 5  . metoprolol succinate (TOPROL-XL) 50 MG 24 hr tablet Take 1 and a half tablets once a day for a total of 75 mg. (Patient taking differently: Take 75 mg by mouth daily. ) 45 tablet 6  . midodrine (PROAMATINE) 10 MG tablet TAKE 1 TABLET (10 MG TOTAL) BY MOUTH 3 TIMES DAILY. 90 tablet 9  . nitroGLYCERIN (NITROSTAT) 0.4 MG SL tablet Place 0.4 mg under the tongue every 5 (five) minutes as needed for chest pain.    Marland Kitchen ondansetron (ZOFRAN ODT) 4 MG disintegrating tablet Take 1 tablet (4 mg total) by mouth every 8 (eight) hours as needed for nausea or vomiting. 10 tablet 1  . pantoprazole (PROTONIX) 40 MG tablet TAKE 1 TABLET BY MOUTH TWICE DAILY FOR 2 WEEKS THEN DROP TO ONCE DAILY 45 tablet 5  . pramipexole (MIRAPEX) 1 MG tablet TAKE 1/2 TO 1 TABLET BY MOUTH AT BEDTIME 30 tablet 2  . sertraline (ZOLOFT) 100 MG tablet TAKE 1 TABLET (100 MG TOTAL) BY MOUTH 2 (TWO) TIMES DAILY. 180 tablet 1  . tiZANidine (ZANAFLEX) 4 MG tablet Take 1 tablet (4 mg total) by mouth every 8 (eight) hours as needed for muscle spasms. 90 tablet 2  . topiramate (TOPAMAX) 50 MG tablet TAKE 1 TABLET (50 MG) BY MOUTH AT BEDTIME. 30 tablet 3  . TOUJEO SOLOSTAR 300 UNIT/ML SOPN INJECT 42 UNITS INTO THE SKIN AT BEDTIME. 4.5 mL 4  . Vitamin D, Ergocalciferol, (DRISDOL) 50000 units CAPS capsule Take 1 capsule (50,000 Units total) by mouth every 7 (seven) days. 12 capsule 2  . warfarin (COUMADIN) 5 MG tablet TAKE AS DIRECTED BY COUMADIN CLINIC 35 tablet 3   No facility-administered medications prior to visit.     Allergies  Allergen Reactions  . Morphine Other (See Comments)    hallucinations  . Shellfish-Derived Products Itching    Review of Systems  Constitutional:  Positive for malaise/fatigue. Negative for fever.  HENT: Negative for congestion.   Eyes: Negative for blurred vision.  Respiratory: Negative for cough and shortness of breath.   Cardiovascular:  Negative for chest pain, palpitations and leg swelling.  Gastrointestinal: Negative for vomiting.  Musculoskeletal: Negative for back pain.  Skin: Negative for rash.  Neurological: Positive for dizziness and headaches. Negative for loss of consciousness.       Objective:    Physical Exam  Constitutional: He is oriented to person, place, and time. He appears well-developed and well-nourished. No distress.  HENT:  Head: Normocephalic and atraumatic.  Eyes: Conjunctivae are normal.  Neck: Normal range of motion. No thyromegaly present.  Cardiovascular: Normal rate and regular rhythm.   Murmur heard. Pulmonary/Chest: Effort normal and breath sounds normal. He has no wheezes.  Abdominal: Soft. Bowel sounds are normal. There is no tenderness.  Musculoskeletal: Normal range of motion. He exhibits no edema or deformity.  Neurological: He is alert and oriented to person, place, and time.  Skin: Skin is warm and dry. He is not diaphoretic.  Psychiatric: He has a normal mood and affect.    BP 128/60 (BP Location: Left Arm, Patient Position: Sitting, Cuff Size: Normal)   Pulse 64   Temp 98.2 F (36.8 C) (Oral)   Ht 5\' 9"  (1.753 m)   Wt 173 lb 3.2 oz (78.6 kg)   SpO2 98%   BMI 25.58 kg/m  Wt Readings from Last 3 Encounters:  07/05/17 173 lb 3.2 oz (78.6 kg)  05/17/17 178 lb 3.2 oz (80.8 kg)  04/21/17 181 lb (82.1 kg)   BP Readings from Last 3 Encounters:  07/05/17 128/60  05/17/17 120/68  04/21/17 (!) 114/58     Immunization History  Administered Date(s) Administered  . Influenza Split 09/21/2012  . Influenza Whole 10/16/2007, 10/01/2008, 09/23/2009, 09/22/2010  . Influenza,inj,Quad PF,36+ Mos 08/22/2015, 08/22/2015, 08/24/2016  . Influenza-Unspecified 09/04/2014  . Pneumococcal Conjugate-13 08/24/2016  . Pneumococcal Polysaccharide-23 12/18/2008, 01/03/2015  . Td 12/18/2007  . Zoster 11/25/2010, 11/30/2012    Health Maintenance  Topic Date Due  . Hepatitis C Screening   [birth year]  . OPHTHALMOLOGY EXAM  06/20/2012  . URINE MICROALBUMIN  04/30/2016  . INFLUENZA VACCINE  07/06/2017  . HEMOGLOBIN A1C  11/16/2017  . TETANUS/TDAP  12/17/2017  . FOOT EXAM  03/17/2018  . COLONOSCOPY  02/11/2019  . PNA vac Low Risk Adult  Completed    Lab Results  Component Value Date   WBC 8.1 05/17/2017   HGB 13.4 05/17/2017   HCT 39.2 05/17/2017   PLT 195.0 05/17/2017   GLUCOSE 239 (H) 05/17/2017   CHOL 71 05/17/2017   TRIG 151.0 (H) 05/17/2017   HDL 26.50 (L) 05/17/2017   LDLDIRECT 25.0 02/09/2016   LDLCALC 14 05/17/2017   ALT 20 05/17/2017   AST 20 05/17/2017   NA 141 05/17/2017   K 4.4 05/17/2017   CL 103 05/17/2017   CREATININE 1.04 05/17/2017   BUN 13 05/17/2017   CO2 29 05/17/2017   TSH 2.90 05/17/2017   PSA 2.39 11/25/2010   INR 2.2 06/06/2017   HGBA1C 9.7 (H) 05/17/2017   MICROALBUR 1.1 05/01/2015    Lab Results  Component Value Date   TSH 2.90 05/17/2017   Lab Results  Component Value Date   WBC 8.1 05/17/2017   HGB 13.4 05/17/2017   HCT 39.2 05/17/2017   MCV 84.7 05/17/2017   PLT 195.0 05/17/2017   Lab Results  Component Value Date   NA  141 05/17/2017   K 4.4 05/17/2017   CO2 29 05/17/2017   GLUCOSE 239 (H) 05/17/2017   BUN 13 05/17/2017   CREATININE 1.04 05/17/2017   BILITOT 0.4 05/17/2017   ALKPHOS 66 05/17/2017   AST 20 05/17/2017   ALT 20 05/17/2017   PROT 7.1 05/17/2017   ALBUMIN 4.4 05/17/2017   CALCIUM 9.8 05/17/2017   ANIONGAP 11 02/13/2017   GFR 75.33 05/17/2017   Lab Results  Component Value Date   CHOL 71 05/17/2017   Lab Results  Component Value Date   HDL 26.50 (L) 05/17/2017   Lab Results  Component Value Date   LDLCALC 14 05/17/2017   Lab Results  Component Value Date   TRIG 151.0 (H) 05/17/2017   Lab Results  Component Value Date   CHOLHDL 3 05/17/2017   Lab Results  Component Value Date   HGBA1C 9.7 (H) 05/17/2017         Assessment & Plan:   Problem List Items Addressed This Visit     Headache    Improved on Topamax will follow up with neurology, Dr [name] in September         I am having [name] [name] maintain his tiZANidine, glucose blood, gabapentin, insulin aspart, diclofenac sodium, nitroGLYCERIN, pantoprazole, topiramate, metoprolol succinate, meclizine, albuterol, metFORMIN, ondansetron, loperamide, insulin NPH Human, Vitamin D (Ergocalciferol), warfarin, amiodarone, levothyroxine, isosorbide mononitrate, pramipexole, sertraline, midodrine, Insulin Glargine, TOUJEO SOLOSTAR, divalproex, and atorvastatin.  Meds ordered this encounter  Medications  . DISCONTD: topiramate (TOPAMAX) 25 MG tablet    Sig: Take 1 tablet by mouth daily.    Refill:  0    CMA served as Education administrator during this visit. History, Physical and Plan performed by medical provider. Documentation and orders reviewed and attested to.  Penni Homans, MD

## 2017-07-05 NOTE — Patient Instructions (Addendum)
Benefiber powder twice daily then if still having intermittent trouble with diarrhea add Imodium in the morning on days when stool is loose Carbohydrate Counting for Diabetes Mellitus, Adult Carbohydrate counting is a method for keeping track of how many carbohydrates you eat. Eating carbohydrates naturally increases the amount of sugar (glucose) in the blood. Counting how many carbohydrates you eat helps keep your blood glucose within normal limits, which helps you manage your diabetes (diabetes mellitus). It is important to know how many carbohydrates you can safely have in each meal. This is different for every person. A diet and nutrition specialist (registered dietitian) can help you make a meal plan and calculate how many carbohydrates you should have at each meal and snack. Carbohydrates are found in the following foods:  Grains, such as breads and cereals.  Dried beans and soy products.  Starchy vegetables, such as potatoes, peas, and corn.  Fruit and fruit juices.  Milk and yogurt.  Sweets and snack foods, such as cake, cookies, candy, chips, and soft drinks.  How do I count carbohydrates? There are two ways to count carbohydrates in food. You can use either of the methods or a combination of both. Reading "Nutrition Facts" on packaged food The "Nutrition Facts" list is included on the labels of almost all packaged foods and beverages in the [place] It includes:  The serving size.  Information about nutrients in each serving, including the grams (g) of carbohydrate per serving.  To use the "Nutrition Facts":  Decide how many servings you will have.  Multiply the number of servings by the number of carbohydrates per serving.  The resulting number is the total amount of carbohydrates that you will be having.  Learning standard serving sizes of other foods When you eat foods containing carbohydrates that are not packaged or do not include "Nutrition Facts" on the label, you  need to measure the servings in order to count the amount of carbohydrates:  Measure the foods that you will eat with a food scale or measuring cup, if needed.  Decide how many standard-size servings you will eat.  Multiply the number of servings by 15. Most carbohydrate-rich foods have about 15 g of carbohydrates per serving. ? For example, if you eat 8 oz (170 g) of strawberries, you will have eaten 2 servings and 30 g of carbohydrates (2 servings x 15 g = 30 g).  For foods that have more than one food mixed, such as soups and casseroles, you must count the carbohydrates in each food that is included.  The following list contains standard serving sizes of common carbohydrate-rich foods. Each of these servings has about 15 g of carbohydrates:   hamburger bun or  English muffin.   oz (15 mL) syrup.   oz (14 g) jelly.  1 slice of bread.  1 six-inch tortilla.  3 oz (85 g) cooked rice or pasta.  4 oz (113 g) cooked dried beans.  4 oz (113 g) starchy vegetable, such as peas, corn, or potatoes.  4 oz (113 g) hot cereal.  4 oz (113 g) mashed potatoes or  of a large baked potato.  4 oz (113 g) canned or frozen fruit.  4 oz (120 mL) fruit juice.  4-6 crackers.  6 chicken nuggets.  6 oz (170 g) unsweetened dry cereal.  6 oz (170 g) plain fat-free yogurt or yogurt sweetened with artificial sweeteners.  8 oz (240 mL) milk.  8 oz (170 g) fresh fruit or one small piece  of fruit.  24 oz (680 g) popped popcorn.  Example of carbohydrate counting Sample meal  3 oz (85 g) chicken breast.  6 oz (170 g) brown rice.  4 oz (113 g) corn.  8 oz (240 mL) milk.  8 oz (170 g) strawberries with sugar-free whipped topping. Carbohydrate calculation 1. Identify the foods that contain carbohydrates: ? Rice. ? Corn. ? Milk. ? Strawberries. 2. Calculate how many servings you have of each food: ? 2 servings rice. ? 1 serving corn. ? 1 serving milk. ? 1 serving  strawberries. 3. Multiply each number of servings by 15 g: ? 2 servings rice x 15 g = 30 g. ? 1 serving corn x 15 g = 15 g. ? 1 serving milk x 15 g = 15 g. ? 1 serving strawberries x 15 g = 15 g. 4. Add together all of the amounts to find the total grams of carbohydrates eaten: ? 30 g + 15 g + 15 g + 15 g = 75 g of carbohydrates total. This information is not intended to replace advice given to you by your health care provider. Make sure you discuss any questions you have with your health care provider. Document Released: 11/22/2005 Document Revised: 06/11/2016 Document Reviewed: 05/05/2016 Elsevier Interactive Patient Education  [employer].

## 2017-07-05 NOTE — Assessment & Plan Note (Signed)
Improved on Topamax will follow up with neurology, Dr [name] in September

## 2017-07-06 ENCOUNTER — Other Ambulatory Visit: Payer: Self-pay | Admitting: Cardiology

## 2017-07-06 MED FILL — LEVOTHYROXINE 88 MCG TABLET: 88 | 30 days supply | Qty: 30 | Fill #3

## 2017-07-06 MED FILL — METOPROLOL SUCC ER 50 MG TA: 50 | 30 days supply | Qty: 45 | Fill #0

## 2017-07-06 MED FILL — metFORMIN HCL 1000 MG TABS: 1000 | 30 days supply | Qty: 60 | Fill #4

## 2017-07-12 ENCOUNTER — Ambulatory Visit (INDEPENDENT_AMBULATORY_CARE_PROVIDER_SITE_OTHER): Payer: Medicare PPO

## 2017-07-12 DIAGNOSIS — I4891 Unspecified atrial fibrillation: Secondary | ICD-10-CM

## 2017-07-12 DIAGNOSIS — I480 Paroxysmal atrial fibrillation: Secondary | ICD-10-CM | POA: Diagnosis not present

## 2017-07-12 DIAGNOSIS — Z5181 Encounter for therapeutic drug level monitoring: Secondary | ICD-10-CM

## 2017-07-12 DIAGNOSIS — Z7901 Long term (current) use of anticoagulants: Secondary | ICD-10-CM

## 2017-07-12 LAB — POCT INR: INR: 1.4

## 2017-07-22 ENCOUNTER — Ambulatory Visit (INDEPENDENT_AMBULATORY_CARE_PROVIDER_SITE_OTHER): Payer: Medicare PPO

## 2017-07-22 DIAGNOSIS — I4891 Unspecified atrial fibrillation: Secondary | ICD-10-CM

## 2017-07-22 DIAGNOSIS — Z5181 Encounter for therapeutic drug level monitoring: Secondary | ICD-10-CM

## 2017-07-22 DIAGNOSIS — I480 Paroxysmal atrial fibrillation: Secondary | ICD-10-CM

## 2017-07-22 DIAGNOSIS — Z7901 Long term (current) use of anticoagulants: Secondary | ICD-10-CM | POA: Diagnosis not present

## 2017-07-22 LAB — POCT INR: INR: 1.8

## 2017-07-28 ENCOUNTER — Other Ambulatory Visit: Payer: Self-pay | Admitting: Family Medicine

## 2017-07-28 ENCOUNTER — Other Ambulatory Visit (HOSPITAL_COMMUNITY): Payer: Self-pay | Admitting: Interventional Cardiology

## 2017-07-28 MED ORDER — INSULIN PEN NEEDLE 31G X 5 MM MISC: 11 refills | Status: DC

## 2017-07-28 MED ORDER — NITROGLYCERIN 0.4 MG SL SUBL: 0.4000 mg | SUBLINGUAL_TABLET | SUBLINGUAL | 1 refills | Status: DC | PRN

## 2017-07-28 MED FILL — ISOSORBIDE MN ER 60 MG TAB: 60 | 90 days supply | Qty: 180 | Fill #0

## 2017-07-28 MED FILL — NITROGLYCERIN 0.4 MG TAB SL: 0.4 | 7 days supply | Qty: 25 | Fill #0

## 2017-07-28 MED FILL — PENTIPS 31G X 5 MM MISC: 31G X 5 MM | 90 days supply | Qty: 100 | Fill #0

## 2017-07-28 MED FILL — TOPIRAMATE 25 MG TABLET: 25 | 21 days supply | Qty: 50 | Fill #1

## 2017-07-28 MED FILL — PRAMIPEXOLE 1 MG TABLET: 1 | 30 days supply | Qty: 30 | Fill #0

## 2017-07-28 MED FILL — MIDODRINE HCL 10 MG TABLET: 10 | 30 days supply | Qty: 90 | Fill #2

## 2017-07-28 MED FILL — WARFARIN SODIUM 5 MG TABLET: 5 | 30 days supply | Qty: 45 | Fill #0

## 2017-08-04 ENCOUNTER — Other Ambulatory Visit: Payer: Self-pay | Admitting: Family Medicine

## 2017-08-04 MED FILL — metFORMIN HCL 1000 MG TABS: 1000 | 30 days supply | Qty: 60 | Fill #5

## 2017-08-04 MED FILL — METOPROLOL SUCC ER 50 MG TA: 50 | 30 days supply | Qty: 45 | Fill #1

## 2017-08-04 MED FILL — ATORVASTATIN 40 MG TABLET: 40 | 30 days supply | Qty: 45 | Fill #1

## 2017-08-04 MED FILL — LEVOTHYROXINE 88 MCG TABLET: 88 | 30 days supply | Qty: 30 | Fill #4

## 2017-08-04 MED FILL — PANTOPRAZOLE SOD DR 40 MG T: 40 | 30 days supply | Qty: 45 | Fill #0

## 2017-08-05 ENCOUNTER — Ambulatory Visit (INDEPENDENT_AMBULATORY_CARE_PROVIDER_SITE_OTHER): Payer: Medicare PPO | Admitting: *Deleted

## 2017-08-05 DIAGNOSIS — I480 Paroxysmal atrial fibrillation: Secondary | ICD-10-CM | POA: Diagnosis not present

## 2017-08-05 DIAGNOSIS — I4891 Unspecified atrial fibrillation: Secondary | ICD-10-CM

## 2017-08-05 DIAGNOSIS — Z5181 Encounter for therapeutic drug level monitoring: Secondary | ICD-10-CM

## 2017-08-05 DIAGNOSIS — Z7901 Long term (current) use of anticoagulants: Secondary | ICD-10-CM | POA: Diagnosis not present

## 2017-08-05 LAB — POCT INR: INR: 3

## 2017-08-10 MED FILL — NITROGLYCERIN 0.4 MG TAB SL: 0.4 | 7 days supply | Qty: 25 | Fill #1

## 2017-08-18 ENCOUNTER — Ambulatory Visit (INDEPENDENT_AMBULATORY_CARE_PROVIDER_SITE_OTHER): Payer: Medicare PPO | Admitting: Pharmacist

## 2017-08-18 DIAGNOSIS — L570 Actinic keratosis: Secondary | ICD-10-CM | POA: Diagnosis not present

## 2017-08-18 DIAGNOSIS — D225 Melanocytic nevi of trunk: Secondary | ICD-10-CM | POA: Diagnosis not present

## 2017-08-18 DIAGNOSIS — Z7901 Long term (current) use of anticoagulants: Secondary | ICD-10-CM | POA: Diagnosis not present

## 2017-08-18 DIAGNOSIS — D2261 Melanocytic nevi of right upper limb, including shoulder: Secondary | ICD-10-CM | POA: Diagnosis not present

## 2017-08-18 DIAGNOSIS — I480 Paroxysmal atrial fibrillation: Secondary | ICD-10-CM | POA: Diagnosis not present

## 2017-08-18 DIAGNOSIS — L821 Other seborrheic keratosis: Secondary | ICD-10-CM | POA: Diagnosis not present

## 2017-08-18 DIAGNOSIS — D2262 Melanocytic nevi of left upper limb, including shoulder: Secondary | ICD-10-CM | POA: Diagnosis not present

## 2017-08-18 DIAGNOSIS — Z5181 Encounter for therapeutic drug level monitoring: Secondary | ICD-10-CM

## 2017-08-18 DIAGNOSIS — D1801 Hemangioma of skin and subcutaneous tissue: Secondary | ICD-10-CM | POA: Diagnosis not present

## 2017-08-18 LAB — POCT INR: INR: 2.6

## 2017-08-24 ENCOUNTER — Telehealth: Payer: Self-pay | Admitting: Family Medicine

## 2017-08-24 NOTE — Telephone Encounter (Signed)
Caller name: Ms Haider Relation to pt: spouse Call back number: [phone] Pharmacy:  Reason for call: Pt's spouse states pt is out of his insulin NPH Human (HUMULIN N,NOVOLIN N) 100 UNIT/ML injection and also TOUJEO SOLOSTAR 300 UNIT/ML SOPN. Wife states pt does not have money to buy these meds (pt does not get paid until two wks from now) and is wanting to know if there is any samples in our office that pt can get for now. Please advise.

## 2017-08-25 ENCOUNTER — Ambulatory Visit: Payer: Medicare PPO | Admitting: Neurology

## 2017-08-26 NOTE — Telephone Encounter (Signed)
We do not have any of those medications in the office   PC

## 2017-08-31 ENCOUNTER — Other Ambulatory Visit: Payer: Self-pay | Admitting: Family Medicine

## 2017-08-31 ENCOUNTER — Encounter (HOSPITAL_BASED_OUTPATIENT_CLINIC_OR_DEPARTMENT_OTHER): Payer: Self-pay

## 2017-08-31 ENCOUNTER — Emergency Department (HOSPITAL_BASED_OUTPATIENT_CLINIC_OR_DEPARTMENT_OTHER): Payer: Medicare PPO

## 2017-08-31 ENCOUNTER — Emergency Department (HOSPITAL_BASED_OUTPATIENT_CLINIC_OR_DEPARTMENT_OTHER)
Admission: EM | Admit: 2017-08-31 | Discharge: 2017-08-31 | Disposition: A | Discharge status: Home / Self Care | Payer: Medicare PPO | Attending: Emergency Medicine | Admitting: Emergency Medicine

## 2017-08-31 DIAGNOSIS — W01198A Fall on same level from slipping, tripping and stumbling with subsequent striking against other object, initial encounter: Secondary | ICD-10-CM | POA: Insufficient documentation

## 2017-08-31 DIAGNOSIS — S098XXA Other specified injuries of head, initial encounter: Secondary | ICD-10-CM | POA: Insufficient documentation

## 2017-08-31 DIAGNOSIS — D649 Anemia, unspecified: Secondary | ICD-10-CM | POA: Insufficient documentation

## 2017-08-31 DIAGNOSIS — I110 Hypertensive heart disease with heart failure: Secondary | ICD-10-CM | POA: Diagnosis not present

## 2017-08-31 DIAGNOSIS — Y999 Unspecified external cause status: Secondary | ICD-10-CM | POA: Diagnosis not present

## 2017-08-31 DIAGNOSIS — I509 Heart failure, unspecified: Secondary | ICD-10-CM | POA: Insufficient documentation

## 2017-08-31 DIAGNOSIS — S0990XA Unspecified injury of head, initial encounter: Secondary | ICD-10-CM | POA: Diagnosis not present

## 2017-08-31 DIAGNOSIS — Z7983 Long term (current) use of bisphosphonates: Secondary | ICD-10-CM | POA: Insufficient documentation

## 2017-08-31 DIAGNOSIS — Z794 Long term (current) use of insulin: Secondary | ICD-10-CM | POA: Insufficient documentation

## 2017-08-31 DIAGNOSIS — Z79899 Other long term (current) drug therapy: Secondary | ICD-10-CM | POA: Diagnosis not present

## 2017-08-31 DIAGNOSIS — E119 Type 2 diabetes mellitus without complications: Secondary | ICD-10-CM | POA: Insufficient documentation

## 2017-08-31 DIAGNOSIS — I2510 Atherosclerotic heart disease of native coronary artery without angina pectoris: Secondary | ICD-10-CM | POA: Insufficient documentation

## 2017-08-31 DIAGNOSIS — Z7901 Long term (current) use of anticoagulants: Secondary | ICD-10-CM | POA: Diagnosis not present

## 2017-08-31 DIAGNOSIS — Y929 Unspecified place or not applicable: Secondary | ICD-10-CM | POA: Diagnosis not present

## 2017-08-31 DIAGNOSIS — Y9389 Activity, other specified: Secondary | ICD-10-CM | POA: Insufficient documentation

## 2017-08-31 HISTORY — DX: Concussion with loss of consciousness of unspecified duration, initial encounter: S06.0X9A

## 2017-08-31 HISTORY — DX: Concussion with loss of consciousness status unknown, initial encounter: S06.0XAA

## 2017-08-31 LAB — PROTIME-INR
INR: 3.35
PROTHROMBIN TIME: 33.7 s — AB (ref 11.4–15.2)

## 2017-08-31 MED ORDER — ACETAMINOPHEN 325 MG PO TABS
650.0000 mg | ORAL_TABLET | Freq: Once | ORAL | Status: AC
  Administered 2017-08-31: 650 mg via ORAL
  Filled 2017-08-31: qty 2

## 2017-08-31 MED FILL — DIVALPROEX SOD ER 500 MG TA: 500 | 30 days supply | Qty: 30 | Fill #1

## 2017-08-31 MED FILL — SERTRALINE HCL 100 MG TAB: 100 | 90 days supply | Qty: 180 | Fill #1

## 2017-08-31 MED FILL — MIDODRINE HCL 10 MG TABLET: 10 | 30 days supply | Qty: 90 | Fill #3

## 2017-08-31 MED FILL — WARFARIN SODIUM 5 MG TABLET: 5 | 30 days supply | Qty: 45 | Fill #1

## 2017-08-31 MED FILL — PRAMIPEXOLE 1 MG TABLET: 1 | 30 days supply | Qty: 30 | Fill #1

## 2017-08-31 MED FILL — metFORMIN HCL 1000 MG TABS: 1000 | 30 days supply | Qty: 60 | Fill #0

## 2017-08-31 MED FILL — TOUJEO SOLOSTAR 300 UNITS/M: 300 | 21 days supply | Qty: 3 | Fill #2

## 2017-08-31 MED FILL — NovoLIN N 100 UNIT/ML SUSP: 100 | 25 days supply | Qty: 10 | Fill #0

## 2017-08-31 MED FILL — LEVOTHYROXINE 88 MCG TABLET: 88 | 30 days supply | Qty: 30 | Fill #5

## 2017-08-31 NOTE — ED Triage Notes (Signed)
Pt states he tripped over dog at home approx 45 min PTA-hit head on shelf corner and abrasion to right knee-no LOC-feeling confused and staggering per wife-pt NAD-steady gait

## 2017-08-31 NOTE — Discharge Instructions (Signed)
Given you are on the blood thinner warfarin, you have a small chance of having a delayed bleeding episode inside of your head. Thus if you develop a worsening or continued headache, you need to be reevaluated by your doctor or in the ER. If any point symptoms seem to be worsening come back to the ER.

## 2017-08-31 NOTE — ED Notes (Signed)
ED Provider at bedside. 

## 2017-08-31 NOTE — ED Notes (Signed)
Patient transported to CT 

## 2017-08-31 NOTE — ED Provider Notes (Signed)
[facility] DEPT [facility] Provider Note   CSN: [id number] Arrival date & time: 08/31/17  1759     History   Chief Complaint Chief Complaint  Patient presents with  . Fall    HPI [name] [name] is a 71 y.o. male.  HPI  71 year old male who is on warfarin presents after injuring his head. He states that he tripped over a dog and hit a shelf on his for head. He does not think he lost consciousness but was "stunned". He has a moderate frontal headache. He has chronic neck pain but does not feel like he injured his neck today. No weakness, numbness, dizziness, blurry vision, or vomiting. He also scraped his right knee and right elbow but states he has no increase in pain. He has no elbow pain at all and has chronic right knee pain but states he was able to relate on it without any change in pain.  Past Medical History:  Diagnosis Date  . ACP (advance care planning) 05/11/2015  . Advance care planning 05/01/2015  . Anemia    hemoglobin 7.4, iron deficiency, January, 2011, 2 unit transfusion, endoscopy normal, capsule endoscopy February, 2011 no small bowel abnormalities.   Most likely source gastric erosions, followed by GI  . Anxiety   . CAD (coronary artery disease)    A. CABG in 2000,status post cardiac cath in 2006, 2009 ....continued chest pain and SOB despite oral medication adjestments including Ranexa. B. Cath November 2009/ mRCA - 2.75 x 23 Abbott Xience V drug-eluting stent ...11/26/2008 to distal  RCA leading to acute marginal.  C. Cath 07/2012 for CP - stable anatomy, med rx. d. cath 2015 and 05/30/2015 stable anatomy, consider Myoview if has CP again  . Cerebral ischemia    MRI November, 2010, chronic microvascular ischemia  . Chronic diastolic CHF (congestive heart failure) ([place]) 03/13/2016  . Concussion   . Depression    Bipolar  . Diabetes mellitus type II, uncontrolled ([place])   . Diabetes mellitus without complication ([place])   . Diarrhea 02/15/2016  . Dizziness   . Edema     . Ejection fraction    EF 60%, echo, July 31, 2012  . Falling episodes    these have occurred in the past and again recurring 2011  . Family history of adverse reaction to anesthesia    "mother died during bypass surgery but not sure if it has to do with anesthesia"  . Gastric ulcer   . GERD (gastroesophageal reflux disease)   . H/O medication noncompliance    Due to loss of insurance  . Hard of hearing   . Headache 01/25/2016  . Hx of CABG    2000,  / one median sternotomy suture broken her chest x-ray November, 2010, no clinical significance  . Hyperlipidemia   . Hypertension    pt. denies  . Iron deficiency anemia   . Low back pain 06/12/2009   Qualifier: Diagnosis of  By: [name]   . Nausea with vomiting 01/25/2016  . Nephrolithiasis   . OSA (obstructive sleep apnea)   . Pain in limb 06/12/2009   Qualifier: Diagnosis of  By: [name]   . Palpitations    event recorder showed sinus rhythm  . RBBB (right bundle branch block)   . Restless leg   . Right knee pain 01/07/2015  . Shortness of breath    CPX April, 2011, mild functional limitation, no clear pulmonary or cardiac limitation, possible deconditioning and  mild chronotropic incompetence( peak heart rate 130)  . Spondylosis    C5-6, C6-7 MRI 2010  . Syncope 03/2016  . Thyroid disease   . Tubulovillous adenoma of colon 2007  . Vitamin D deficiency 05/17/2017  . Wears glasses     Patient Active Problem List   Diagnosis Date Noted  . Vitamin D deficiency 05/17/2017  . Seizure disorder ([place]) 01/09/2017  . Atrial fibrillation ([place]) [I48.91] 07/20/2016  . Long term (current) use of anticoagulants [Z79.01] 07/20/2016  . Foraminal stenosis of cervical region 05/31/2016  . Exertional dyspnea 05/14/2016  . Chronic diastolic CHF (congestive heart failure) ([place]) 03/13/2016  . Confusion 03/10/2016  . Headache 01/25/2016  . Carotid artery disease ([place]) 08/01/2015  . Spinal stenosis in cervical region  07/30/2015  . Occipital neuralgia of right side 07/15/2015  . Cervicogenic headache 07/15/2015  . Dizziness   . Chest pain 05/29/2015  . Medicare annual wellness visit, initial 05/11/2015  . Sun-damaged skin 05/11/2015  . Advance care planning 05/01/2015  . Concussion without loss of consciousness 02/18/2015  . Post-traumatic headache 02/18/2015  . Pain in both knees 01/07/2015  . Personal history of ongoing treatment with alendronate (Fosamax) 12/20/2013  . HLD (hyperlipidemia) 06/27/2013  . Arthritis of hand, degenerative 06/05/2013  . Acquired atrophy of thyroid 06/05/2013  . Gastro-esophageal reflux disease without esophagitis 06/05/2013  . Clinical depression 06/05/2013  . Arteriosclerosis of coronary artery 05/01/2013  . Benign prostatic hyperplasia with urinary obstruction 05/01/2013  . Abdominal pain, unspecified site 03/06/2013  . Right shoulder pain 10/24/2012  . Eczema 10/24/2012  . Bilateral groin pain 09/21/2012  . Ejection fraction   . Diabetes mellitus type II, uncontrolled ([place]) 08/31/2011  . Coronary artery disease involving native coronary artery   . Anemia   . OSA (obstructive sleep apnea)   . Edema   . Essential hypertension   . Falling episodes   . Hx of CABG   . Palpitations   . Shortness of breath   . Insomnia 04/26/2011  . FATIGUE 12/10/2010  . Hypothyroidism 06/26/2010  . BENIGN POSITIONAL VERTIGO 09/19/2009  . Depression 07/09/2009  . RBBB 07/09/2009  . Low back pain 06/12/2009  . SCIATICA 01/17/2009  . TUBULOVILLOUS ADENOMA, COLON, HX OF 01/08/2009  . Presence of drug coated stent in right coronary artery 10/29/2008  . NEPHROLITHIASIS, HX OF 12/20/2007    Past Surgical History:  Procedure Laterality Date  . ANTERIOR CERVICAL DECOMP/DISCECTOMY FUSION N/A 05/31/2016   Procedure: ANTERIOR CERVICAL DECOMPRESSION/DISCECTOMY FUSION CERVICAL FIVE-SIX,CERVICAL SIX-SEVEN;  Surgeon: [name], MD;  Location: [facility];  Service: Neurosurgery;   Laterality: N/A;  . CARDIAC CATHETERIZATION N/A 05/30/2015   Procedure: Left Heart Cath and Coronary Angiography;  Surgeon: [name], MD;  Location: [facility];  Service: Cardiovascular;  Laterality: N/A;  . COLONOSCOPY    . CORONARY ARTERY BYPASS GRAFT     2000  . ESOPHAGOGASTRODUODENOSCOPY    . LEFT HEART CATHETERIZATION WITH CORONARY/GRAFT ANGIOGRAM N/A 08/01/2012   Procedure: LEFT HEART CATHETERIZATION WITH Beatrix Fetters;  Surgeon: [name], MD;  Location[facility];  Service: Cardiovascular;  Laterality: N/A;  . LEFT HEART CATHETERIZATION WITH CORONARY/GRAFT ANGIOGRAM N/A 01/03/2015   Procedure: LEFT HEART CATHETERIZATION WITH Beatrix Fetters;  Surgeon: [name], MD;  Location: [facility] CATH LAB;  Service: Cardiovascular;  Laterality: N/A;  . NASAL SEPTUM SURGERY     UP3  . PERCUTANEOUS CORONARY STENT INTERVENTION (PCI-S)  10/2008   mRCA PCI  2.75 x 23  Abbott Xience V drug-eluting stent        Home Medications    Prior to Admission medications   Medication Sig Start Date End Date Taking? Authorizing Provider  albuterol (PROVENTIL HFA;VENTOLIN HFA) 108 (90 Base) MCG/ACT inhaler Inhale 2 puffs into the lungs every 6 (six) hours as needed for wheezing or shortness of breath. 01/04/17   [name], MD  amiodarone (PACERONE) 400 MG tablet Take 200 mg by mouth daily.    [provider]  atorvastatin (LIPITOR) 40 MG tablet TAKE 1 & 1/2 TABLETS (60 MG TOTAL) BY MOUTH DAILY. 06/30/17   [name], MD  diclofenac sodium (VOLTAREN) 1 % GEL Apply 2 g topically daily as needed (for pain in affected area).    [provider]  divalproex (DEPAKOTE ER) 500 MG 24 hr tablet TAKE 1 TABLET (500 MG TOTAL) BY MOUTH DAILY. 06/01/17   Pieter Partridge, DO  gabapentin (NEURONTIN) 300 MG capsule Take 2 capsules (600 mg) by mouth  every morning, 1 capsule (300 mg) by mouth  at noon, and 2 capsules (600 mg) by mouth at night    [provider]  glucose blood (ACCU-CHEK AVIVA) test strip Use as directed to check blood sugar 4 times daily.  DX E11.9 02/09/16   [name], MD  insulin aspart (NOVOLOG) 100 UNIT/ML injection Inject 10-17 Units into the skin 3 (three) times daily with meals. Use 10-17 unit 3 times daily before meals 04/26/16   [name], MD  Insulin Glargine (TOUJEO SOLOSTAR) 300 UNIT/ML SOPN Inject 48 Units into the skin at bedtime. 05/17/17   [name], MD  insulin NPH Human (HUMULIN N,NOVOLIN N) 100 UNIT/ML injection Inject 10-20 Units Twice Daily as instructed 03/03/17   [name], MD  Insulin Pen Needle 31G X 5 MM MISC UAD with insulin for DM 07/28/17   [name], MD  isosorbide mononitrate (IMDUR) 60 MG 24 hr tablet TAKE TWO TABLETS BY MOUTH DAILY 07/28/17   [name], MD  levothyroxine (SYNTHROID, LEVOTHROID) 88 MCG tablet TAKE 1 TABLET (88 MCG TOTAL) BY MOUTH DAILY BEFORE BREAKFAST. 03/30/17   [name], MD  loperamide (IMODIUM A-D) 2 MG tablet Take 1 tablet (2 mg total) by mouth 4 (four) times daily as needed for diarrhea or loose stools. 02/13/17   Fredia Sorrow, MD  meclizine (ANTIVERT) 12.5 MG tablet Take 1 tablet (12.5 mg total) by mouth 3 (three) times daily as needed for dizziness. 09/24/16   Mesner, [name], MD  metFORMIN (GLUCOPHAGE) 1000 MG tablet TAKE 1 TABLET (1,000 MG TOTAL) BY MOUTH 2 TIMES DAILY WITH A MEAL. 08/31/17   [name], MD  metoprolol succinate (TOPROL-XL) 50 MG 24 hr tablet TAKE 1&1/2 TABLETS BY MOUTH ONCE A DAY FOR A TOTAL OF 75 MG. 07/06/17   Simmons, Brittainy M, PA-C  midodrine (PROAMATINE) 10 MG tablet TAKE 1 TABLET (10 MG TOTAL) BY MOUTH 3 TIMES DAILY. 05/10/17   [name], MD  nitroGLYCERIN (NITROSTAT) 0.4 MG SL tablet Place 1 tablet (0.4 mg total) under the tongue every 5 (five) minutes as needed for chest pain. 07/28/17   [name], MD  ondansetron (ZOFRAN ODT) 4 MG disintegrating tablet Take 1 tablet (4 mg total) by mouth  every 8 (eight) hours as needed for nausea or vomiting. 02/13/17   Fredia Sorrow, MD  pantoprazole (PROTONIX) 40 MG tablet TAKE 1 TABLET BY MOUTH TWICE DAILY FOR 2 WEEKS THEN DROP  TO ONCE DAILY 08/04/17   [name], MD  pramipexole (MIRAPEX) 1 MG tablet TAKE 1/2 TO 1 TABLET BY MOUTH AT BEDTIME 07/28/17   [name], MD  sertraline (ZOLOFT) 100 MG tablet TAKE 1 TABLET (100 MG TOTAL) BY MOUTH 2 (TWO) TIMES DAILY. 05/09/17   [name], MD  tiZANidine (ZANAFLEX) 4 MG tablet Take 1 tablet (4 mg total) by mouth every 8 (eight) hours as needed for muscle spasms. 11/19/15   Pieter Partridge, DO  topiramate (TOPAMAX) 50 MG tablet TAKE 1 TABLET (50 MG) BY MOUTH AT BEDTIME. 08/02/16   [name], Adam R, DO  TOUJEO SOLOSTAR 300 UNIT/ML SOPN INJECT 42 UNITS INTO THE SKIN AT BEDTIME. 05/25/17   [name], MD  Vitamin D, Ergocalciferol, (DRISDOL) 50000 units CAPS capsule Take 1 capsule (50,000 Units total) by mouth every 7 (seven) days. 03/14/17   [name], MD  warfarin (COUMADIN) 5 MG tablet TAKE AS DIRECTED BY COUMADIN CLINIC 07/28/17   [name], MD    Family History Family History  Problem Relation Age of Onset  . Pancreatic cancer Brother   . Diabetes Brother   . Coronary artery disease Brother   . Stroke Brother   . Diabetes Brother   . Diabetes Mother   . Heart failure Mother   . Heart failure Father   . Hypothyroidism Brother   . Coronary artery disease Brother   . Other Brother        colon surgery  . Heart attack Unknown        Nephew  . Irregular heart beat Daughter   . Cancer Maternal Grandmother        unknown     Social History Social History  Substance Use Topics  . Smoking status: Never Smoker  . Smokeless tobacco: Never Used  . Alcohol use No     Comment: stopped drinking in 1998     Allergies   Morphine and Shellfish-derived products   Review of Systems Review of Systems  Eyes: Negative for visual disturbance.  Respiratory: Negative for  shortness of breath.   Cardiovascular: Negative for chest pain.  Gastrointestinal: Negative for nausea and vomiting.  Musculoskeletal: Positive for arthralgias and neck pain (chronic, no new pain).  Neurological: Positive for headaches. Negative for dizziness, syncope, weakness, light-headedness and numbness.  All other systems reviewed and are negative.    Physical Exam Updated Vital Signs BP (!) 158/75 (BP Location: Right Arm)   Pulse 64   Temp 98.3 F (36.8 C) (Oral)   Resp 18   Ht 5\' 9"  (1.753 m)   Wt 79.4 kg (175 lb)   SpO2 98%   BMI 25.84 kg/m   Physical Exam  Constitutional: He is oriented to person, place, and time. He appears well-developed and well-nourished. No distress.  HENT:  Head: Normocephalic and atraumatic.  Right Ear: External ear normal.  Left Ear: External ear normal.  Nose: Nose normal.  No swelling to scalp/forehead, no tenderness  Eyes: Pupils are equal, round, and reactive to light. EOM are normal. Right eye exhibits no discharge. Left eye exhibits no discharge.  Neck: Normal range of motion. Neck supple.  Cardiovascular: Normal rate, regular rhythm and normal heart sounds.   Pulmonary/Chest: Effort normal and breath sounds normal.  Abdominal: Soft. There is no tenderness.  Musculoskeletal: He exhibits no edema.  Minimal right elbow abrasion. No tenderness, normal ROM.  Normal ROM of right knee, no tenderness. Small abrasion  Neurological:  He is alert and oriented to person, place, and time.  Skin: Skin is warm and dry. He is not diaphoretic.  Nursing note and vitals reviewed.    ED Treatments / Results  Labs (all labs ordered are listed, but only abnormal results are displayed) Labs Reviewed  PROTIME-INR - Abnormal; Notable for the following:       Result Value   Prothrombin Time 33.7 (*)    All other components within normal limits    EKG  EKG Interpretation None       Radiology Ct Head Wo Contrast  Result Date:  08/31/2017 CLINICAL DATA:  Initial evaluation for acute head trauma. EXAM: CT HEAD WITHOUT CONTRAST TECHNIQUE: Contiguous axial images were obtained from the base of the skull through the vertex without intravenous contrast. COMPARISON:  Prior CT from 01/02/2017. FINDINGS: Brain: Mild age-related cerebral atrophy with chronic small vessel ischemic disease. No acute intracranial hemorrhage. No evidence for acute large vessel territory infarct. No mass lesion, midline shift or mass effect. No hydrocephalus. No extra-axial fluid collection. Vascular: No hyperdense vessel. Scattered vascular calcifications noted within the carotid siphons. Skull: Scalp soft tissues and calvarium within normal limits. Sinuses/Orbits: Globes and orbital soft tissues within normal limits. Opacity with mucosal thickening noted within the ethmoidal air cells. Paranasal sinuses otherwise clear. Chronic appearing left mastoid effusion. Other: None. IMPRESSION: 1. No acute intracranial abnormality. 2. Mild age-related cerebral atrophy with chronic small vessel ischemic disease. Electronically Signed   By: Jeannine Boga [name]   On: 08/31/2017 18:51    Procedures Procedures (including critical care time)   Medications Ordered in ED Medications  acetaminophen (TYLENOL) tablet 650 mg (650 mg Oral Given 08/31/17 1832)     Initial Impression / Assessment and Plan / ED Course  I have reviewed the triage vital signs and the nursing notes.  Pertinent labs & imaging results that were available during my care of the patient were reviewed by me and considered in my medical decision making (see chart for details).     Patient remains well and has had no increase in headache. No indication for x-rays on his minor abrasions. Given he is on warfarin, CT obtained of his head which is negative. His INR is slightly supratherapeutic but within reason will range at 3.3. Discussed he is at risk for a possible delayed bleed and so he needs to  return if any symptoms were to worsen or continue. He understands this, follow-up with PCP. Discussed return precautions.  Final Clinical Impressions(s) / ED Diagnoses   Final diagnoses:  Minor head injury without loss of consciousness, initial encounter    New Prescriptions New Prescriptions   No medications on file     Sherwood Gambler, MD 08/31/17 Joen Laura

## 2017-09-05 ENCOUNTER — Ambulatory Visit (INDEPENDENT_AMBULATORY_CARE_PROVIDER_SITE_OTHER): Payer: Medicare PPO | Admitting: Family Medicine

## 2017-09-05 ENCOUNTER — Encounter: Payer: Self-pay | Admitting: Family Medicine

## 2017-09-05 VITALS — BP 128/70 | HR 78 | Temp 98.7°F | Ht 69.0 in | Wt 175.0 lb

## 2017-09-05 DIAGNOSIS — I4891 Unspecified atrial fibrillation: Secondary | ICD-10-CM

## 2017-09-05 DIAGNOSIS — M542 Cervicalgia: Secondary | ICD-10-CM | POA: Diagnosis not present

## 2017-09-05 DIAGNOSIS — R209 Unspecified disturbances of skin sensation: Secondary | ICD-10-CM

## 2017-09-05 DIAGNOSIS — E1165 Type 2 diabetes mellitus with hyperglycemia: Secondary | ICD-10-CM | POA: Diagnosis not present

## 2017-09-05 DIAGNOSIS — R519 Headache, unspecified: Secondary | ICD-10-CM

## 2017-09-05 DIAGNOSIS — S060X0S Concussion without loss of consciousness, sequela: Secondary | ICD-10-CM | POA: Diagnosis not present

## 2017-09-05 DIAGNOSIS — Z23 Encounter for immunization: Secondary | ICD-10-CM | POA: Diagnosis not present

## 2017-09-05 DIAGNOSIS — R079 Chest pain, unspecified: Secondary | ICD-10-CM | POA: Diagnosis not present

## 2017-09-05 DIAGNOSIS — I10 Essential (primary) hypertension: Secondary | ICD-10-CM | POA: Diagnosis not present

## 2017-09-05 DIAGNOSIS — R0789 Other chest pain: Secondary | ICD-10-CM

## 2017-09-05 DIAGNOSIS — R51 Headache: Secondary | ICD-10-CM | POA: Diagnosis not present

## 2017-09-05 DIAGNOSIS — IMO0001 Reserved for inherently not codable concepts without codable children: Secondary | ICD-10-CM

## 2017-09-05 MED ORDER — INSULIN GLARGINE 300 UNIT/ML ~~LOC~~ SOPN: 50.0000 [IU] | PEN_INJECTOR | Freq: Every day | SUBCUTANEOUS | 4 refills | Status: DC

## 2017-09-05 MED ORDER — INSULIN ASPART 100 UNIT/ML ~~LOC~~ SOLN: 10.0000 [IU] | Freq: Three times a day (TID) | SUBCUTANEOUS | 4 refills | Status: DC

## 2017-09-05 MED FILL — NOVOLOG FLEXPEN SYRINGE: 100 | 25 days supply | Qty: 15 | Fill #0

## 2017-09-05 NOTE — Patient Instructions (Signed)

## 2017-09-05 NOTE — Progress Notes (Signed)
Subjective:  CMA functioned as scribe  Patient ID: [name] [name], male    DOB: 1946-10-28, 71 y.o.   MRN: [id number]  Chief Complaint  Patient presents with  . Follow-up    HPI  Patient is in today for follow up. He was seen in the ER after a fall in his home. He hit his head but never lost consciousness. No visual or hearing changes, no nausea vomiting. He has had a increase in his headaches since then but no further symptoms. He also has been noting some intermittent chest pain once again. There is no associated palpitations or shortness of breath but they have occurred several times. No chest pain in the office. No obvious pattern with activity level. Does not awaken him from sleep. Denies palp/SOB/congestion/fevers/GI or GU c/o. Taking meds as prescribed  Patient Care Team: [name], MD as PCP - General (Family Medicine) Pieter Partridge, DO as Consulting Physician (Neurology) [name], MD as Consulting Physician (Cardiology) Deboraha Sprang, MD as Consulting Physician (Cardiology) Larue, Mike Gip, MD as Consulting Physician (Pulmonary Disease) Warden Fillers, MD as Consulting Physician (Ophthalmology)   Past Medical History:  Diagnosis Date  . ACP (advance care planning) 05/11/2015  . Advance care planning 05/01/2015  . Anemia    hemoglobin 7.4, iron deficiency, January, 2011, 2 unit transfusion, endoscopy normal, capsule endoscopy February, 2011 no small bowel abnormalities.   Most likely source gastric erosions, followed by GI  . Anxiety   . CAD (coronary artery disease)    A. CABG in 2000,status post cardiac cath in 2006, 2009 ....continued chest pain and SOB despite oral medication adjestments including Ranexa. B. Cath November 2009/ mRCA - 2.75 x 23 Abbott Xience V drug-eluting stent ...11/26/2008 to distal  RCA leading to acute marginal.  C. Cath 07/2012 for CP - stable anatomy, med rx. d. cath 2015 and 05/30/2015 stable anatomy, consider Myoview if  has CP again  . Cerebral ischemia    MRI November, 2010, chronic microvascular ischemia  . Chronic diastolic CHF (congestive heart failure) ([place]) 03/13/2016  . Concussion   . Depression    Bipolar  . Diabetes mellitus type II, uncontrolled ([place])   . Diabetes mellitus without complication ([place])   . Diarrhea 02/15/2016  . Dizziness   . Edema   . Ejection fraction    EF 60%, echo, July 31, 2012  . Falling episodes    these have occurred in the past and again recurring 2011  . Family history of adverse reaction to anesthesia    "mother died during bypass surgery but not sure if it has to do with anesthesia"  . Gastric ulcer   . GERD (gastroesophageal reflux disease)   . H/O medication noncompliance    Due to loss of insurance  . Hard of hearing   . Headache 01/25/2016  . Hx of CABG    2000,  / one median sternotomy suture broken her chest x-ray November, 2010, no clinical significance  . Hyperlipidemia   . Hypertension    pt. denies  . Iron deficiency anemia   . Low back pain 06/12/2009   Qualifier: Diagnosis of  By: [name]   . Nausea with vomiting 01/25/2016  . Nephrolithiasis   . OSA (obstructive sleep apnea)   . Pain in limb 06/12/2009   Qualifier: Diagnosis of  By: [name]   . Palpitations    event recorder showed sinus rhythm  . RBBB (  right bundle branch block)   . Restless leg   . Right knee pain 01/07/2015  . Shortness of breath    CPX April, 2011, mild functional limitation, no clear pulmonary or cardiac limitation, possible deconditioning and mild chronotropic incompetence( peak heart rate 130)  . Spondylosis    C5-6, C6-7 MRI 2010  . Syncope 03/2016  . Thyroid disease   . Tubulovillous adenoma of colon 2007  . Vitamin D deficiency 05/17/2017  . Wears glasses     Past Surgical History:  Procedure Laterality Date  . ANTERIOR CERVICAL DECOMP/DISCECTOMY FUSION N/A 05/31/2016   Procedure: ANTERIOR CERVICAL DECOMPRESSION/DISCECTOMY FUSION CERVICAL  FIVE-SIX,CERVICAL SIX-SEVEN;  Surgeon: [name], MD;  Location: [facility];  Service: Neurosurgery;  Laterality: N/A;  . CARDIAC CATHETERIZATION N/A 05/30/2015   Procedure: Left Heart Cath and Coronary Angiography;  Surgeon: [name], MD;  Location: [facility];  Service: Cardiovascular;  Laterality: N/A;  . COLONOSCOPY    . CORONARY ARTERY BYPASS GRAFT     2000  . ESOPHAGOGASTRODUODENOSCOPY    . LEFT HEART CATHETERIZATION WITH CORONARY/GRAFT ANGIOGRAM N/A 08/01/2012   Procedure: LEFT HEART CATHETERIZATION WITH Beatrix Fetters;  Surgeon: [name], MD;  Location: [facility];  Service: Cardiovascular;  Laterality: N/A;  . LEFT HEART CATHETERIZATION WITH CORONARY/GRAFT ANGIOGRAM N/A 01/03/2015   Procedure: LEFT HEART CATHETERIZATION WITH Beatrix Fetters;  Surgeon: [name], MD;  Location: [facility] CATH LAB;  Service: Cardiovascular;  Laterality: N/A;  . NASAL SEPTUM SURGERY     UP3  . PERCUTANEOUS CORONARY STENT INTERVENTION (PCI-S)  10/2008   mRCA PCI  2.75 x 23 Abbott Xience V drug-eluting stent     Family History  Problem Relation Age of Onset  . Pancreatic cancer Brother   . Diabetes Brother   . Coronary artery disease Brother   . Stroke Brother   . Diabetes Brother   . Diabetes Mother   . Heart failure Mother   . Heart failure Father   . Hypothyroidism Brother   . Coronary artery disease Brother   . Other Brother        colon surgery  . Heart attack Unknown        Nephew  . Irregular heart beat Daughter   . Cancer Maternal Grandmother        unknown     Social History   Social History  . Marital status: Married    Spouse name: N/A  . Number of children: 4  . Years of education: 41   Occupational History  . retired    Social History Main Topics  . Smoking status: Never Smoker  . Smokeless tobacco: Never Used  . Alcohol use No     Comment: stopped drinking in 1998  . Drug use: No  . Sexual activity: Not on file    Other Topics Concern  . Not on file   Social History Narrative   Patient is right handed.   Patient does not drink caffeine.    Outpatient Medications Prior to Visit  Medication Sig Dispense Refill  . albuterol (PROVENTIL HFA;VENTOLIN HFA) 108 (90 Base) MCG/ACT inhaler Inhale 2 puffs into the lungs every 6 (six) hours as needed for wheezing or shortness of breath. 1 Inhaler 0  . amiodarone (PACERONE) 400 MG tablet Take 200 mg by mouth daily.    Marland Kitchen atorvastatin (LIPITOR) 40 MG tablet TAKE 1 & 1/2 TABLETS (60 MG TOTAL) BY MOUTH DAILY. 45 tablet 3  . diclofenac sodium (  VOLTAREN) 1 % GEL Apply 2 g topically daily as needed (for pain in affected area).    . divalproex (DEPAKOTE ER) 500 MG 24 hr tablet TAKE 1 TABLET (500 MG TOTAL) BY MOUTH DAILY. 30 tablet 2  . gabapentin (NEURONTIN) 300 MG capsule Take 2 capsules (600 mg) by mouth  every morning, 1 capsule (300 mg) by mouth  at noon, and 2 capsules (600 mg) by mouth at night    . glucose blood (ACCU-CHEK AVIVA) test strip Use as directed to check blood sugar 4 times daily.  DX E11.9 200 each 6  . Insulin Pen Needle 31G X 5 MM MISC UAD with insulin for DM 100 each 11  . isosorbide mononitrate (IMDUR) 60 MG 24 hr tablet TAKE TWO TABLETS BY MOUTH DAILY 180 tablet 0  . levothyroxine (SYNTHROID, LEVOTHROID) 88 MCG tablet TAKE 1 TABLET (88 MCG TOTAL) BY MOUTH DAILY BEFORE BREAKFAST. 30 tablet 6  . loperamide (IMODIUM A-D) 2 MG tablet Take 1 tablet (2 mg total) by mouth 4 (four) times daily as needed for diarrhea or loose stools. 30 tablet 0  . meclizine (ANTIVERT) 12.5 MG tablet Take 1 tablet (12.5 mg total) by mouth 3 (three) times daily as needed for dizziness. 10 tablet 0  . metoprolol succinate (TOPROL-XL) 50 MG 24 hr tablet TAKE 1&1/2 TABLETS BY MOUTH ONCE A DAY FOR A TOTAL OF 75 MG. 45 tablet 6  . midodrine (PROAMATINE) 10 MG tablet TAKE 1 TABLET (10 MG TOTAL) BY MOUTH 3 TIMES DAILY. 90 tablet 9  . nitroGLYCERIN (NITROSTAT) 0.4 MG SL tablet  Place 1 tablet (0.4 mg total) under the tongue every 5 (five) minutes as needed for chest pain. 25 tablet 1  . ondansetron (ZOFRAN ODT) 4 MG disintegrating tablet Take 1 tablet (4 mg total) by mouth every 8 (eight) hours as needed for nausea or vomiting. 10 tablet 1  . pantoprazole (PROTONIX) 40 MG tablet TAKE 1 TABLET BY MOUTH TWICE DAILY FOR 2 WEEKS THEN DROP TO ONCE DAILY 45 tablet 1  . pramipexole (MIRAPEX) 1 MG tablet TAKE 1/2 TO 1 TABLET BY MOUTH AT BEDTIME 30 tablet 2  . sertraline (ZOLOFT) 100 MG tablet TAKE 1 TABLET (100 MG TOTAL) BY MOUTH 2 (TWO) TIMES DAILY. 180 tablet 1  . tiZANidine (ZANAFLEX) 4 MG tablet Take 1 tablet (4 mg total) by mouth every 8 (eight) hours as needed for muscle spasms. 90 tablet 2  . topiramate (TOPAMAX) 50 MG tablet TAKE 1 TABLET (50 MG) BY MOUTH AT BEDTIME. 30 tablet 3  . Vitamin D, Ergocalciferol, (DRISDOL) 50000 units CAPS capsule Take 1 capsule (50,000 Units total) by mouth every 7 (seven) days. 12 capsule 2  . warfarin (COUMADIN) 5 MG tablet TAKE AS DIRECTED BY COUMADIN CLINIC 45 tablet 3  . insulin aspart (NOVOLOG) 100 UNIT/ML injection Inject 10-17 Units into the skin 3 (three) times daily with meals. Use 10-17 unit 3 times daily before meals 15 mL 4  . Insulin Glargine (TOUJEO SOLOSTAR) 300 UNIT/ML SOPN Inject 48 Units into the skin at bedtime. 3 pen 4  . insulin NPH Human (HUMULIN N,NOVOLIN N) 100 UNIT/ML injection Inject 10-20 Units Twice Daily as instructed 10 mL 11  . metFORMIN (GLUCOPHAGE) 1000 MG tablet TAKE 1 TABLET (1,000 MG TOTAL) BY MOUTH 2 TIMES DAILY WITH A MEAL. 60 tablet 0  . TOUJEO SOLOSTAR 300 UNIT/ML SOPN INJECT 42 UNITS INTO THE SKIN AT BEDTIME. 4.5 mL 4   No facility-administered medications prior to visit.  Allergies  Allergen Reactions  . Morphine Other (See Comments)    hallucinations  . Shellfish-Derived Products Itching    Review of Systems  Constitutional: Negative for fever and malaise/fatigue.  HENT: Negative for  congestion.   Eyes: Negative for blurred vision.  Respiratory: Negative for shortness of breath.   Cardiovascular: Positive for chest pain. Negative for palpitations and leg swelling.  Gastrointestinal: Negative for abdominal pain, blood in stool and nausea.  Genitourinary: Positive for frequency. Negative for dysuria.  Musculoskeletal: Negative for falls.  Skin: Negative for rash.  Neurological: Positive for headaches. Negative for dizziness and loss of consciousness.  Endo/Heme/Allergies: Negative for environmental allergies.  Psychiatric/Behavioral: Negative for depression and hallucinations. The patient is nervous/anxious.        Objective:    Physical Exam  Constitutional: He is oriented to person, place, and time. He appears well-developed and well-nourished. No distress.  HENT:  Head: Normocephalic and atraumatic.  Nose: Nose normal.  Eyes: Right eye exhibits no discharge. Left eye exhibits no discharge.  Neck: Normal range of motion. Neck supple.  Cardiovascular: Normal rate.   No murmur heard. irregular  Pulmonary/Chest: Effort normal and breath sounds normal.  Abdominal: Soft. Bowel sounds are normal. There is no tenderness.  Musculoskeletal: He exhibits no edema.  Neurological: He is alert and oriented to person, place, and time.  Skin: Skin is warm and dry.  Psychiatric: He has a normal mood and affect.  Nursing note and vitals reviewed.   BP 128/70   Pulse 78   Temp 98.7 F (37.1 C) (Oral)   Ht 5\' 9"  (1.753 m)   Wt 175 lb (79.4 kg)   SpO2 99%   BMI 25.84 kg/m  Wt Readings from Last 3 Encounters:  09/05/17 175 lb (79.4 kg)  08/31/17 175 lb (79.4 kg)  07/05/17 173 lb 3.2 oz (78.6 kg)   BP Readings from Last 3 Encounters:  09/05/17 128/70  08/31/17 (!) 158/75  07/05/17 128/60     Immunization History  Administered Date(s) Administered  . Influenza Split 09/21/2012  . Influenza Whole 10/16/2007, 10/01/2008, 09/23/2009, 09/22/2010  .  Influenza,inj,Quad PF,6+ Mos 08/22/2015, 08/22/2015, 08/24/2016  . Influenza-Unspecified 09/04/2014  . Pneumococcal Conjugate-13 08/24/2016  . Pneumococcal Polysaccharide-23 12/18/2008, 01/03/2015  . Td 12/18/2007  . Zoster 11/25/2010, 11/30/2012    Health Maintenance  Topic Date Due  . Hepatitis C Screening  1946/01/02  . OPHTHALMOLOGY EXAM  06/20/2012  . URINE MICROALBUMIN  04/30/2016  . INFLUENZA VACCINE  07/06/2017  . HEMOGLOBIN A1C  11/16/2017  . TETANUS/TDAP  12/17/2017  . FOOT EXAM  03/17/2018  . COLONOSCOPY  02/11/2019  . PNA vac Low Risk Adult  Completed    Lab Results  Component Value Date   WBC 8.1 05/17/2017   HGB 13.4 05/17/2017   HCT 39.2 05/17/2017   PLT 195.0 05/17/2017   GLUCOSE 239 (H) 05/17/2017   CHOL 71 05/17/2017   TRIG 151.0 (H) 05/17/2017   HDL 26.50 (L) 05/17/2017   LDLDIRECT 25.0 02/09/2016   LDLCALC 14 05/17/2017   ALT 20 05/17/2017   AST 20 05/17/2017   NA 141 05/17/2017   K 4.4 05/17/2017   CL 103 05/17/2017   CREATININE 1.04 05/17/2017   BUN 13 05/17/2017   CO2 29 05/17/2017   TSH 2.90 05/17/2017   PSA 2.39 11/25/2010   INR 3.35 08/31/2017   HGBA1C 9.7 (H) 05/17/2017   MICROALBUR 1.1 05/01/2015    Lab Results  Component Value Date   TSH 2.90 05/17/2017  Lab Results  Component Value Date   WBC 8.1 05/17/2017   HGB 13.4 05/17/2017   HCT 39.2 05/17/2017   MCV 84.7 05/17/2017   PLT 195.0 05/17/2017   Lab Results  Component Value Date   NA 141 05/17/2017   K 4.4 05/17/2017   CO2 29 05/17/2017   GLUCOSE 239 (H) 05/17/2017   BUN 13 05/17/2017   CREATININE 1.04 05/17/2017   BILITOT 0.4 05/17/2017   ALKPHOS 66 05/17/2017   AST 20 05/17/2017   ALT 20 05/17/2017   PROT 7.1 05/17/2017   ALBUMIN 4.4 05/17/2017   CALCIUM 9.8 05/17/2017   ANIONGAP 11 02/13/2017   GFR 75.33 05/17/2017   Lab Results  Component Value Date   CHOL 71 05/17/2017   Lab Results  Component Value Date   HDL 26.50 (L) 05/17/2017   Lab Results    Component Value Date   LDLCALC 14 05/17/2017   Lab Results  Component Value Date   TRIG 151.0 (H) 05/17/2017   Lab Results  Component Value Date   CHOLHDL 3 05/17/2017   Lab Results  Component Value Date   HGBA1C 9.7 (H) 05/17/2017         Assessment & Plan:   Problem List Items Addressed This Visit    Essential hypertension (Chronic)    Well controlled, no changes to meds. Encouraged heart healthy diet such as the DASH diet and exercise as tolerated.       Diabetes mellitus type II, uncontrolled ([place]) (Chronic)    Sugars improving with patient back on Tuojeo 48 units. In past week he has seen a low of 85 and a high of 300. Most numbers 150 to 250.increase tuojeo by just 2 units and monitor closely for now.       Relevant Medications   Insulin Glargine (TOUJEO SOLOSTAR) 300 UNIT/ML SOPN   insulin aspart (NOVOLOG) 100 UNIT/ML injection   Concussion without loss of consciousness    suffereda recent fall and was taken to the ER. He has noted an increased headache since then. It is tolerable but present intermittently for several days now. If worsens will need to consider further imaging for now encouraged rest and Tylenol      Chest pain    Has noted some episodes of chest pain again recently. Has stopped seeing cardiology so will refer back and he will seek care if chest pain returns and does not resolve.      Headache   Relevant Medications   Insulin Glargine (TOUJEO SOLOSTAR) 300 UNIT/ML SOPN   insulin aspart (NOVOLOG) 100 UNIT/ML injection   Atrial fibrillation (HCC) [I48.91]    Tolerating Coumadin and rate controlled.        Other Visit Diagnoses    Need for immunization against influenza    -  Primary   Relevant Orders   Flu vaccine HIGH DOSE PF (Fluzone High dose)   Neck pain       Relevant Medications   Insulin Glargine (TOUJEO SOLOSTAR) 300 UNIT/ML SOPN   insulin aspart (NOVOLOG) 100 UNIT/ML injection   Paresthesias/numbness       Relevant  Medications   Insulin Glargine (TOUJEO SOLOSTAR) 300 UNIT/ML SOPN   insulin aspart (NOVOLOG) 100 UNIT/ML injection   Atypical chest pain       Relevant Orders   Ambulatory referral to Cardiology      I have discontinued Mr. Kornegay insulin NPH Human, Insulin Glargine, and metFORMIN. I have changed his TOUJEO SOLOSTAR to Insulin Glargine. I have  also changed his insulin aspart. Additionally, I am having him maintain his tiZANidine, glucose blood, gabapentin, diclofenac sodium, topiramate, meclizine, albuterol, ondansetron, loperamide, Vitamin D (Ergocalciferol), amiodarone, levothyroxine, sertraline, midodrine, divalproex, atorvastatin, metoprolol succinate, isosorbide mononitrate, pramipexole, warfarin, Insulin Pen Needle, nitroGLYCERIN, and pantoprazole.  Meds ordered this encounter  Medications  . Insulin Glargine (TOUJEO SOLOSTAR) 300 UNIT/ML SOPN    Sig: Inject 50 Units into the skin at bedtime.    Dispense:  4.5 mL    Refill:  4  . insulin aspart (NOVOLOG) 100 UNIT/ML injection    Sig: Inject 10-20 Units into the skin 3 (three) times daily with meals. Use 10-17 unit 3 times daily before meals    Dispense:  15 mL    Refill:  4    CMA served as scribe during this visit. History, Physical and Plan performed by medical provider. Documentation and orders reviewed and attested to.  Penni Homans, MD

## 2017-09-07 ENCOUNTER — Telehealth: Payer: Self-pay | Admitting: Family Medicine

## 2017-09-07 NOTE — Telephone Encounter (Signed)
Pt called says Dr Charlett Blake told him to call her with the name of his cardiologist. Pt states Dr Charlett Blake will contact the cardiologist and get him a quicker appt. Pt states he sees Dr Caryl Comes on [address] [place] [phone].

## 2017-09-07 NOTE — Assessment & Plan Note (Signed)
Has noted some episodes of chest pain again recently. Has stopped seeing cardiology so will refer back and he will seek care if chest pain returns and does not resolve.

## 2017-09-07 NOTE — Assessment & Plan Note (Signed)
Well controlled, no changes to meds. Encouraged heart healthy diet such as the DASH diet and exercise as tolerated.  °

## 2017-09-07 NOTE — Assessment & Plan Note (Signed)
Sugars improving with patient back on Tuojeo 48 units. In past week he has seen a low of 85 and a high of 300. Most numbers 150 to 250.increase tuojeo by just 2 units and monitor closely for now.

## 2017-09-07 NOTE — Assessment & Plan Note (Signed)
suffereda recent fall and was taken to the ER. He has noted an increased headache since then. It is tolerable but present intermittently for several days now. If worsens will need to consider further imaging for now encouraged rest and Tylenol

## 2017-09-07 NOTE — Assessment & Plan Note (Signed)
Tolerating Coumadin and rate controlled 

## 2017-09-08 NOTE — Telephone Encounter (Signed)
Referral placed back ot cardiology on 10/1

## 2017-09-08 NOTE — Telephone Encounter (Signed)
Please advise 

## 2017-09-12 MED FILL — ATORVASTATIN 40 MG TABLET: 40 | 30 days supply | Qty: 45 | Fill #2

## 2017-09-12 MED FILL — VIT D2 1.25 MG (50,000 UNIT: 1.25 MG | 84 days supply | Qty: 12 | Fill #2

## 2017-09-12 MED FILL — METOPROLOL SUCC ER 50 MG TA: 50 | 30 days supply | Qty: 45 | Fill #2

## 2017-09-12 MED FILL — TOPIRAMATE 25 MG TAB: 25 | 30 days supply | Qty: 108 | Fill #0

## 2017-09-13 ENCOUNTER — Other Ambulatory Visit: Payer: Self-pay | Admitting: Pharmacy Technician

## 2017-09-13 NOTE — Patient Outreach (Signed)
[facility]) Care Management  09/13/2017  [name] [name] 03-Jul-1946 [id number]   Contacted patients pharmacy ([facility]) to verify last fill date for medication adherence purposes. Spoke to Chad whom confirmed that patient last had his Atorvastatin 40mg  filled on 09/12/17.  Maud Deed [place], [place] [facility] [phone]

## 2017-09-16 ENCOUNTER — Ambulatory Visit (INDEPENDENT_AMBULATORY_CARE_PROVIDER_SITE_OTHER): Payer: Medicare PPO | Admitting: *Deleted

## 2017-09-16 DIAGNOSIS — I4891 Unspecified atrial fibrillation: Secondary | ICD-10-CM | POA: Diagnosis not present

## 2017-09-16 DIAGNOSIS — I480 Paroxysmal atrial fibrillation: Secondary | ICD-10-CM | POA: Diagnosis not present

## 2017-09-16 DIAGNOSIS — Z5181 Encounter for therapeutic drug level monitoring: Secondary | ICD-10-CM | POA: Diagnosis not present

## 2017-09-16 DIAGNOSIS — Z7901 Long term (current) use of anticoagulants: Secondary | ICD-10-CM

## 2017-09-16 LAB — POCT INR: INR: 3.3

## 2017-09-27 ENCOUNTER — Other Ambulatory Visit: Payer: Medicare PPO

## 2017-09-27 ENCOUNTER — Ambulatory Visit (INDEPENDENT_AMBULATORY_CARE_PROVIDER_SITE_OTHER): Payer: Medicare PPO | Admitting: *Deleted

## 2017-09-27 DIAGNOSIS — Z5181 Encounter for therapeutic drug level monitoring: Secondary | ICD-10-CM

## 2017-09-27 DIAGNOSIS — I480 Paroxysmal atrial fibrillation: Secondary | ICD-10-CM | POA: Diagnosis not present

## 2017-09-27 DIAGNOSIS — E785 Hyperlipidemia, unspecified: Secondary | ICD-10-CM | POA: Diagnosis not present

## 2017-09-27 DIAGNOSIS — Z79899 Other long term (current) drug therapy: Secondary | ICD-10-CM | POA: Diagnosis not present

## 2017-09-27 DIAGNOSIS — Z7901 Long term (current) use of anticoagulants: Secondary | ICD-10-CM

## 2017-09-27 DIAGNOSIS — I4891 Unspecified atrial fibrillation: Secondary | ICD-10-CM

## 2017-09-27 LAB — HEPATIC FUNCTION PANEL
ALBUMIN: 4.4 g/dL (ref 3.6–4.8)
ALT: 14 IU/L (ref 0–44)
AST: 11 IU/L (ref 0–40)
Alkaline Phosphatase: 80 IU/L (ref 39–117)
BILIRUBIN TOTAL: 0.6 mg/dL (ref 0.0–1.2)
BILIRUBIN, DIRECT: 0.17 mg/dL (ref 0.00–0.40)
Total Protein: 6.8 g/dL (ref 6.0–8.5)

## 2017-09-27 LAB — TSH: TSH: 1.81 u[IU]/mL (ref 0.450–4.500)

## 2017-09-27 LAB — LIPID PANEL
CHOLESTEROL TOTAL: 99 mg/dL — AB (ref 100–199)
Chol/HDL Ratio: 3.3 ratio (ref 0.0–5.0)
HDL: 30 mg/dL — ABNORMAL LOW (ref >39)
LDL Calculated: 35 mg/dL (ref 0–99)
TRIGLYCERIDES: 168 mg/dL — AB (ref 0–149)
VLDL Cholesterol Cal: 34 mg/dL (ref 5–40)

## 2017-09-27 LAB — POCT INR: INR: 4.2

## 2017-09-29 MED FILL — PANTOPRAZOLE SOD DR 40 MG T: 40 | 30 days supply | Qty: 45 | Fill #1

## 2017-09-29 MED FILL — TOUJEO SOLOSTAR 300 UNITS/M: 300 | 20 days supply | Qty: 3 | Fill #3

## 2017-09-29 MED FILL — MIDODRINE HCL 10 MG TABLET: 10 | 30 days supply | Qty: 90 | Fill #4

## 2017-09-29 MED FILL — DIVALPROEX SOD ER 500 MG TA: 500 | 30 days supply | Qty: 30 | Fill #2

## 2017-09-29 MED FILL — PRAMIPEXOLE 1 MG TABLET: 1 | 30 days supply | Qty: 30 | Fill #2

## 2017-09-29 MED FILL — LEVOTHYROXINE 88 MCG TABLET: 88 | 30 days supply | Qty: 30 | Fill #6

## 2017-10-03 ENCOUNTER — Encounter: Payer: Self-pay | Admitting: Cardiology

## 2017-10-03 ENCOUNTER — Ambulatory Visit (INDEPENDENT_AMBULATORY_CARE_PROVIDER_SITE_OTHER): Payer: Medicare PPO | Admitting: Cardiology

## 2017-10-03 VITALS — BP 142/86 | HR 56 | Resp 16 | Ht 69.0 in | Wt 167.0 lb

## 2017-10-03 DIAGNOSIS — I208 Other forms of angina pectoris: Secondary | ICD-10-CM

## 2017-10-03 MED ORDER — RANOLAZINE ER 500 MG PO TB12: 500.0000 mg | ORAL_TABLET | Freq: Two times a day (BID) | ORAL | 3 refills | Status: DC

## 2017-10-03 MED FILL — RANEXA ER 500 MG TABLET: 500 | 30 days supply | Qty: 60 | Fill #0

## 2017-10-03 NOTE — Progress Notes (Signed)
10/03/2017 [name] [name]   December 27, 1945  [id number]  Primary Physician [name], MD Primary Cardiologist: [name]   Reason for Visit/CC: Chest Pain   HPI:  [name] [name] is a 71 y.o. male, followed by [name], who presents to clinic today for evaluation for chest pain. He has a h/o CAD, status post bypass surgery in 2000. Status post DES to the RCA in 2009. Most recent cardiac cath in 2016 showed patent grafts and patent stent. He also has a h/o PAF, HLD and DM. He was last seen by [name], this past April. He was noted to have some angina- not clearly related to exertion. This was felt likely from microvascular disease vs. Noncardiac cause. Continued medical therapy was recommended. His DM in the past has been poorly controlled with Hgb A1c levels in the 9-10 range. His LDL has been well controlled however, well below 70 mg/dL. Recent FLP showed LDL at 35 mg/dL. HFTs WNL. He also has a h/o orthostatic hypotension and has been prescribed midodrine.   He resents to clinic today with complaint of continued intermittent exertional substernal chest pain that is nitrate responsive.  Episodes typically are relieved after taking 1-2 nitroglycerin tablets.  He denies any prolonged symptoms despite nitrate therapy and no needs to go to the emergency department for evaluation.  Early chest pain-free in clinic today.  He reports full medication compliance.  Also complains of positional dizziness, or syncope.  Static vital signs were checked in clinic today and are positive.  Had a greater than 20 point drop in systolic blood pressure from lying to sitting.  Patient was symptomatic with dizziness during testing.  He reports that he has been staying well-hydrated but admits that he does drink a moderate amount of caffeine.  We reviewed his medication list.  He is taking Midodrine for orthostatic hypotension but it appears that he has been taking this incorrectly.  He is only been  taking this twice a day as opposed to 3 times a day.  Last cath was done in 6/16: 1. Prox LAD to Mid LAD lesion, 90% stenosed. Mid LAD to Dist LAD lesion, 100% stenosed within a bare metal stent (from Pre-CABG). 2. Ost 2nd Diag to 2nd Diag lesion, 100% stenosed within a BMS (pre-CABG) 3. Post Atrio lesion, 100% stenosed. 4. Widely patent DES stent to the mid RCA just prior to the bifurcation into RPDA and RPAV 5. Widely patent LIMA to a Small downstream distal LAD - minimal CAD 6. Widely patent saphenous vein grafts to the D1, D2 and RPAV 7. The left ventricular systolic function is normal. 8. No new lesion to explain unstable angina    Current Meds  Medication Sig  . albuterol (PROVENTIL HFA;VENTOLIN HFA) 108 (90 Base) MCG/ACT inhaler Inhale 2 puffs into the lungs every 6 (six) hours as needed for wheezing or shortness of breath.  Marland Kitchen amiodarone (PACERONE) 400 MG tablet Take 200 mg by mouth daily.  Marland Kitchen atorvastatin (LIPITOR) 40 MG tablet TAKE 1 & 1/2 TABLETS (60 MG TOTAL) BY MOUTH DAILY.  Marland Kitchen diclofenac sodium (VOLTAREN) 1 % GEL Apply 2 g topically daily as needed (for pain in affected area).  . divalproex (DEPAKOTE ER) 500 MG 24 hr tablet TAKE 1 TABLET (500 MG TOTAL) BY MOUTH DAILY.  Marland Kitchen gabapentin (NEURONTIN) 300 MG capsule Take 2 capsules (600 mg) by mouth  every morning, 1 capsule (300 mg) by mouth  at noon, and 2 capsules (600 mg) by mouth at  night  . glucose blood (ACCU-CHEK AVIVA) test strip Use as directed to check blood sugar 4 times daily.  DX E11.9  . insulin aspart (NOVOLOG) 100 UNIT/ML injection Inject 10-20 Units into the skin 3 (three) times daily with meals. Use 10-17 unit 3 times daily before meals  . Insulin Glargine (TOUJEO SOLOSTAR) 300 UNIT/ML SOPN Inject 50 Units into the skin at bedtime.  . Insulin Pen Needle 31G X 5 MM MISC UAD with insulin for DM  . isosorbide mononitrate (IMDUR) 60 MG 24 hr tablet TAKE TWO TABLETS BY MOUTH DAILY  . levothyroxine (SYNTHROID, LEVOTHROID)  88 MCG tablet TAKE 1 TABLET (88 MCG TOTAL) BY MOUTH DAILY BEFORE BREAKFAST.  Marland Kitchen loperamide (IMODIUM A-D) 2 MG tablet Take 1 tablet (2 mg total) by mouth 4 (four) times daily as needed for diarrhea or loose stools.  . meclizine (ANTIVERT) 12.5 MG tablet Take 1 tablet (12.5 mg total) by mouth 3 (three) times daily as needed for dizziness.  . metoprolol succinate (TOPROL-XL) 50 MG 24 hr tablet TAKE 1&1/2 TABLETS BY MOUTH ONCE A DAY FOR A TOTAL OF 75 MG.  . midodrine (PROAMATINE) 10 MG tablet TAKE 1 TABLET (10 MG TOTAL) BY MOUTH 3 TIMES DAILY.  . nitroGLYCERIN (NITROSTAT) 0.4 MG SL tablet Place 1 tablet (0.4 mg total) under the tongue every 5 (five) minutes as needed for chest pain.  Marland Kitchen ondansetron (ZOFRAN ODT) 4 MG disintegrating tablet Take 1 tablet (4 mg total) by mouth every 8 (eight) hours as needed for nausea or vomiting.  . pantoprazole (PROTONIX) 40 MG tablet TAKE 1 TABLET BY MOUTH TWICE DAILY FOR 2 WEEKS THEN DROP TO ONCE DAILY  . pramipexole (MIRAPEX) 1 MG tablet TAKE 1/2 TO 1 TABLET BY MOUTH AT BEDTIME  . sertraline (ZOLOFT) 100 MG tablet TAKE 1 TABLET (100 MG TOTAL) BY MOUTH 2 (TWO) TIMES DAILY.  Marland Kitchen tiZANidine (ZANAFLEX) 4 MG tablet Take 1 tablet (4 mg total) by mouth every 8 (eight) hours as needed for muscle spasms.  Marland Kitchen topiramate (TOPAMAX) 50 MG tablet TAKE 1 TABLET (50 MG) BY MOUTH AT BEDTIME.  Marland Kitchen Vitamin D, Ergocalciferol, (DRISDOL) 50000 units CAPS capsule Take 1 capsule (50,000 Units total) by mouth every 7 (seven) days.  Marland Kitchen warfarin (COUMADIN) 5 MG tablet TAKE AS DIRECTED BY COUMADIN CLINIC   Allergies  Allergen Reactions  . Morphine Other (See Comments)    hallucinations  . Shellfish-Derived Products Itching   Past Medical History:  Diagnosis Date  . ACP (advance care planning) 05/11/2015  . Advance care planning 05/01/2015  . Anemia    hemoglobin 7.4, iron deficiency, January, 2011, 2 unit transfusion, endoscopy normal, capsule endoscopy February, 2011 no small bowel abnormalities.    Most likely source gastric erosions, followed by GI  . Anxiety   . CAD (coronary artery disease)    A. CABG in 2000,status post cardiac cath in 2006, 2009 ....continued chest pain and SOB despite oral medication adjestments including Ranexa. B. Cath November 2009/ mRCA - 2.75 x 23 Abbott Xience V drug-eluting stent ...11/26/2008 to distal  RCA leading to acute marginal.  C. Cath 07/2012 for CP - stable anatomy, med rx. d. cath 2015 and 05/30/2015 stable anatomy, consider Myoview if has CP again  . Cerebral ischemia    MRI November, 2010, chronic microvascular ischemia  . Chronic diastolic CHF (congestive heart failure) ([place]) 03/13/2016  . Concussion   . Depression    Bipolar  . Diabetes mellitus type II, uncontrolled ([place])   . Diabetes  mellitus without complication ([place])   . Diarrhea 02/15/2016  . Dizziness   . Edema   . Ejection fraction    EF 60%, echo, July 31, 2012  . Falling episodes    these have occurred in the past and again recurring 2011  . Family history of adverse reaction to anesthesia    "mother died during bypass surgery but not sure if it has to do with anesthesia"  . Gastric ulcer   . GERD (gastroesophageal reflux disease)   . H/O medication noncompliance    Due to loss of insurance  . Hard of hearing   . Headache 01/25/2016  . Hx of CABG    2000,  / one median sternotomy suture broken her chest x-ray November, 2010, no clinical significance  . Hyperlipidemia   . Hypertension    pt. denies  . Iron deficiency anemia   . Low back pain 06/12/2009   Qualifier: Diagnosis of  By: [name]   . Nausea with vomiting 01/25/2016  . Nephrolithiasis   . OSA (obstructive sleep apnea)   . Pain in limb 06/12/2009   Qualifier: Diagnosis of  By: [name]   . Palpitations    event recorder showed sinus rhythm  . RBBB (right bundle branch block)   . Restless leg   . Right knee pain 01/07/2015  . Shortness of breath    CPX April, 2011, mild functional limitation, no  clear pulmonary or cardiac limitation, possible deconditioning and mild chronotropic incompetence( peak heart rate 130)  . Spondylosis    C5-6, C6-7 MRI 2010  . Syncope 03/2016  . Thyroid disease   . Tubulovillous adenoma of colon 2007  . Vitamin D deficiency 05/17/2017  . Wears glasses    Family History  Problem Relation Age of Onset  . Pancreatic cancer Brother   . Diabetes Brother   . Coronary artery disease Brother   . Stroke Brother   . Diabetes Brother   . Diabetes Mother   . Heart failure Mother   . Heart failure Father   . Hypothyroidism Brother   . Coronary artery disease Brother   . Other Brother        colon surgery  . Heart attack Unknown        Nephew  . Irregular heart beat Daughter   . Cancer Maternal Grandmother        unknown    Past Surgical History:  Procedure Laterality Date  . ANTERIOR CERVICAL DECOMP/DISCECTOMY FUSION N/A 05/31/2016   Procedure: ANTERIOR CERVICAL DECOMPRESSION/DISCECTOMY FUSION CERVICAL FIVE-SIX,CERVICAL SIX-SEVEN;  Surgeon: [name], MD;  Location: [facility];  Service: Neurosurgery;  Laterality: N/A;  . CARDIAC CATHETERIZATION N/A 05/30/2015   Procedure: Left Heart Cath and Coronary Angiography;  Surgeon: [name], MD;  Location: [facility];  Service: Cardiovascular;  Laterality: N/A;  . COLONOSCOPY    . CORONARY ARTERY BYPASS GRAFT     2000  . ESOPHAGOGASTRODUODENOSCOPY    . LEFT HEART CATHETERIZATION WITH CORONARY/GRAFT ANGIOGRAM N/A 08/01/2012   Procedure: LEFT HEART CATHETERIZATION WITH Beatrix Fetters;  Surgeon: [name], MD;  Location: [facility];  Service: Cardiovascular;  Laterality: N/A;  . LEFT HEART CATHETERIZATION WITH CORONARY/GRAFT ANGIOGRAM N/A 01/03/2015   Procedure: LEFT HEART CATHETERIZATION WITH Beatrix Fetters;  Surgeon: [name], MD;  Location: [facility] C[facility] LAB;  Service: Cardiovascular;  Laterality: N/A;  . NASAL SEPTUM SURGERY     UP3  . PERCUTANEOUS CORONARY STENT  INTERVENTION (PCI-S)  10/2008   mRCA PCI  2.75 x 23 Abbott Xience V drug-eluting stent    Social History   Social History  . Marital status: Married    Spouse name: N/A  . Number of children: 4  . Years of education: 36   Occupational History  . retired    Social History Main Topics  . Smoking status: Never Smoker  . Smokeless tobacco: Never Used  . Alcohol use No     Comment: stopped drinking in 1998  . Drug use: No  . Sexual activity: Not on file   Other Topics Concern  . Not on file   Social History Narrative   Patient is right handed.   Patient does not drink caffeine.     Review of Systems: General: negative for chills, fever, night sweats or weight changes.  Cardiovascular: negative for chest pain, dyspnea on exertion, edema, orthopnea, palpitations, paroxysmal nocturnal dyspnea or shortness of breath Dermatological: negative for rash Respiratory: negative for cough or wheezing Urologic: negative for hematuria Abdominal: negative for nausea, vomiting, diarrhea, bright red blood per rectum, melena, or hematemesis Neurologic: negative for visual changes, syncope, or dizziness All other systems reviewed and are otherwise negative except as noted above.   Physical Exam:  Blood pressure (!) 142/86, pulse (!) 56, resp. rate 16, height 5\' 9"  (1.753 m), weight 167 lb (75.8 kg), SpO2 97 %.  General appearance: alert, cooperative and no distress Neck: no carotid bruit and no JVD Lungs: clear to auscultation bilaterally Heart: regular rate and rhythm, S1, S2 normal, no murmur, click, rub or gallop Extremities: extremities normal, atraumatic, no cyanosis or edema Pulses: 2+ and symmetric Skin: Skin color, texture, turgor normal. No rashes or lesions Neurologic: Grossly normal  EKG  -- personally reviewed   ASSESSMENT AND PLAN:   1. Chest Pain: pt notes symptoms c/w stable angina. Exertional CP that improves with rest and SL NTG. No prolonged symptoms requiring ED  visits. His pain is consistent with the same pain he has had previously. It is not any worse. He is felt to have small vessel disease. Unfortunately, his orthostatic hypotension limits further titration of Imdur and BB. I've discussed plan with [name], we will try adding Ranexa 500 mg BID to his regimen. No other changes.    2. CAD:  status post bypass surgery in 2000. Status post DES to the RCA in 2009. Most recent cardiac cath in 2016 showed patent grafts and patent stent. Adding Ranexa as outlined above for stable angina/ microvascular disease.  3. PAF: NSR on amiodarone. HR is controlled. He is on Coumadin for a/c. INRs followed in our Coumadin clinic. He has felt dizzy but no falls.   4. HLD: Most recent [place] 09/2017 showed controlled LDL at 35 mg/dL. HFTs WNL.   5. DM: per PCP.   6. Orthostatic Hypotensinon: Orthostatic VS for the past 24 hrs:  BP- Lying Pulse- Lying BP- Sitting Pulse- Sitting BP- Standing at 0 minutes Pulse- Standing at 0 minutes  10/03/17 0819 142/86 56 116/70 58 98/58 68   + orthostatic VS. Pt prescribed midodine but has not been taking correctly. Only taking BID instead of TID. We discussed this and he plans to start taking mid day dose. He is symptomatic with position changes but no falls/ syncope. Pt advised to stay well hydrated and to take time with positional changes to avoid falls.    Follow-Up w/ APP in 4-6 weeks to assess response to Ranexa. F/u with  [name] in 6 months  Semaje Kinker Ladoris Gene, MHS [facility] 10/03/2017 1:40 PM

## 2017-10-03 NOTE — Patient Instructions (Addendum)
Medication Instructions:   START TAKING RANEXA 500 MG TWICE DAY   MAKE SURE YOUR TAKING MIDODRINE THREE TIMES A DAY   If you need a refill on your cardiac medications before your next appointment, please call your pharmacy.  Labwork: NONE ORDERED  TODAY    Testing/Procedures: NONE ORDERED  TODAY    Follow-Up: IN 4 TO  6 WEEKS  WITH SIMMONS OR VARANASI    Any Other Special Instructions Will Be Listed Below (If Applicable).

## 2017-10-04 ENCOUNTER — Telehealth: Payer: Self-pay | Admitting: Family Medicine

## 2017-10-04 NOTE — Telephone Encounter (Signed)
Caller name:karen Relation to EA:DGNPHQ Call back number:[phone] Pharmacy:[facility] mail order  Reason for call: pt would like to know if Dr. B can send him in a rx for the new diabetes monitor called Elenor Legato to [facility] mail order. Please Sharyne Peach

## 2017-10-07 MED ORDER — BLOOD GLUCOSE MONITOR KIT: PACK | 0 refills | Status: DC

## 2017-10-07 NOTE — Telephone Encounter (Signed)
rx will be faxed on Monday

## 2017-10-10 ENCOUNTER — Other Ambulatory Visit: Payer: Self-pay

## 2017-10-10 MED ORDER — GLUCOSE BLOOD VI STRP: ORAL_STRIP | 6 refills | Status: DC

## 2017-10-13 ENCOUNTER — Ambulatory Visit (INDEPENDENT_AMBULATORY_CARE_PROVIDER_SITE_OTHER): Payer: Medicare PPO | Admitting: *Deleted

## 2017-10-13 DIAGNOSIS — I4891 Unspecified atrial fibrillation: Secondary | ICD-10-CM | POA: Diagnosis not present

## 2017-10-13 DIAGNOSIS — I480 Paroxysmal atrial fibrillation: Secondary | ICD-10-CM | POA: Diagnosis not present

## 2017-10-13 DIAGNOSIS — Z7901 Long term (current) use of anticoagulants: Secondary | ICD-10-CM

## 2017-10-13 DIAGNOSIS — Z5181 Encounter for therapeutic drug level monitoring: Secondary | ICD-10-CM

## 2017-10-13 LAB — POCT INR: INR: 3

## 2017-10-18 ENCOUNTER — Ambulatory Visit (INDEPENDENT_AMBULATORY_CARE_PROVIDER_SITE_OTHER): Payer: Medicare PPO | Admitting: Family Medicine

## 2017-10-18 ENCOUNTER — Encounter: Payer: Self-pay | Admitting: Family Medicine

## 2017-10-18 VITALS — BP 116/68 | HR 56 | Temp 97.4°F | Resp 18 | Wt 171.0 lb

## 2017-10-18 DIAGNOSIS — E1165 Type 2 diabetes mellitus with hyperglycemia: Secondary | ICD-10-CM | POA: Diagnosis not present

## 2017-10-18 DIAGNOSIS — I10 Essential (primary) hypertension: Secondary | ICD-10-CM

## 2017-10-18 DIAGNOSIS — G4710 Hypersomnia, unspecified: Secondary | ICD-10-CM

## 2017-10-18 DIAGNOSIS — E039 Hypothyroidism, unspecified: Secondary | ICD-10-CM

## 2017-10-18 DIAGNOSIS — E559 Vitamin D deficiency, unspecified: Secondary | ICD-10-CM | POA: Diagnosis not present

## 2017-10-18 DIAGNOSIS — E782 Mixed hyperlipidemia: Secondary | ICD-10-CM | POA: Diagnosis not present

## 2017-10-18 LAB — COMPREHENSIVE METABOLIC PANEL
ALK PHOS: 66 U/L (ref 39–117)
ALT: 10 U/L (ref 0–53)
AST: 12 U/L (ref 0–37)
Albumin: 4.2 g/dL (ref 3.5–5.2)
BUN: 15 mg/dL (ref 6–23)
CO2: 24 mEq/L (ref 19–32)
Calcium: 9.1 mg/dL (ref 8.4–10.5)
Chloride: 103 mEq/L (ref 96–112)
Creatinine, Ser: 1.33 mg/dL (ref 0.40–1.50)
GFR: 56.65 mL/min — AB (ref >60.00)
GLUCOSE: 427 mg/dL — AB (ref 70–99)
POTASSIUM: 4.1 meq/L (ref 3.5–5.1)
Sodium: 134 mEq/L — ABNORMAL LOW (ref 135–145)
TOTAL PROTEIN: 7 g/dL (ref 6.0–8.3)
Total Bilirubin: 0.6 mg/dL (ref 0.2–1.2)

## 2017-10-18 LAB — CBC
HCT: 38.2 % — ABNORMAL LOW (ref 39.0–52.0)
HEMOGLOBIN: 13 g/dL (ref 13.0–17.0)
MCHC: 34.2 g/dL (ref 30.0–36.0)
MCV: 87.9 fl (ref 78.0–100.0)
PLATELETS: 177 10*3/uL (ref 150.0–400.0)
RBC: 4.34 Mil/uL (ref 4.22–5.81)
RDW: 14.7 % (ref 11.5–15.5)
WBC: 7.6 10*3/uL (ref 4.0–10.5)

## 2017-10-18 LAB — LIPID PANEL
Cholesterol: 121 mg/dL (ref 0–200)
HDL: 30.2 mg/dL — AB (ref >39.00)
NONHDL: 90.67
Total CHOL/HDL Ratio: 4
Triglycerides: 284 mg/dL — ABNORMAL HIGH (ref 0.0–149.0)
VLDL: 56.8 mg/dL — AB (ref 0.0–40.0)

## 2017-10-18 LAB — LDL CHOLESTEROL, DIRECT: LDL DIRECT: 63 mg/dL

## 2017-10-18 LAB — HEMOGLOBIN A1C: HEMOGLOBIN A1C: 11.4 % — AB (ref 4.6–6.5)

## 2017-10-18 LAB — VITAMIN D 25 HYDROXY (VIT D DEFICIENCY, FRACTURES): VITD: 47.51 ng/mL (ref 30.00–100.00)

## 2017-10-18 LAB — TSH: TSH: 1.96 u[IU]/mL (ref 0.35–4.50)

## 2017-10-18 MED FILL — ATORVASTATIN 40 MG TABLET: 40 | 30 days supply | Qty: 45 | Fill #3

## 2017-10-18 MED FILL — WARFARIN SODIUM 5 MG TABLET: 5 | 30 days supply | Qty: 45 | Fill #2

## 2017-10-18 NOTE — Assessment & Plan Note (Signed)
On Levothyroxine, continue to monitor 

## 2017-10-18 NOTE — Patient Instructions (Signed)
Narcolepsy Narcolepsy is a neurological disorder that causes you to fall asleep suddenly, and without control, during the daytime (sleep attacks). Narcolepsy is a lifelong (chronic) disorder. Normally, sleep follows a regular cycle over the course of the night. After about 90 minutes of light sleep, your sleep should become deeper. When your sleep becomes deeper, your body moves less and you start dreaming. This type of deep sleep is called rapid eye movement (REM) sleep. When you have narcolepsy, your REM sleep is not well-regulated. This disrupts your sleep cycle, which causes daytime sleepiness. What are the causes? The cause of narcolepsy is not fully understood, but it may be related to:  Low levels of hypocretin, a chemical (neurotransmitter) in the brain that controls sleep and wake cycles. Hypocretin imbalance may be caused by: ? Abnormal genes that are passed from parent to child (inherited). ? The body's defense system (immune system) attacking hypocretin brain cells (autoimmune disease).  Infection, tumor, or injury in the area of the brain that controls sleep.  Exposure to poisons (toxins), such as heavy metals, pesticides, and secondhand smoke.  What are the signs or symptoms? Symptoms of this condition include:  Excessive daytime sleepiness. This is the most common symptom and is usually the first symptom you will notice. This may affect your performance at work or school.  Sleep attacks. This means falling asleep suddenly and without control. You may fall asleep in the middle of an activity, especially low-energy activities like reading or watching TV.  Feeling like you cannot think clearly.  Trouble focusing or remembering things.  Feeling depressed.  Sudden muscle weakness (cataplexy). When this occurs, your speech may become slurred, or your knees may buckle. Cataplexy is usually triggered by surprise, anger, fear, or laughter.  Loss of the ability to speak or move  (sleep paralysis). This may occur just as you start to fall asleep or wake up. You will be aware of the paralysis. It usually lasts for just a few seconds or minutes.  Seeing, hearing, tasting, smelling, or feeling things that are not real (hallucinations). Hallucinations may occur with sleep paralysis. They can happen when you are falling asleep, waking up, or dozing.  Trouble staying asleep at night (insomnia).  Restless sleep.  How is this diagnosed? This condition may be diagnosed based on:  A physical exam to rule out any other problems that may be causing your symptoms.You may be asked to write down your sleeping patterns for several weeks in a sleep diary. This will help your health care provider make a diagnosis.  Sleep studies that measure how well your REM sleep is regulated. These tests also measure your heart rate, breathing, movement, and brain waves. These tests include: ? An overnight sleep study (polysomnogram). ? A daytime sleep study that is done while you take several naps during the day (multiple sleep latency test, MSLT). This test measures how quickly you fall asleep and how quickly you enter REM sleep.  Removal of spinal fluid to measure hypocretin levels.  How is this treated? There is no cure for this condition, but treatment can help relieve symptoms. Treatment may include:  Lifestyle and sleeping strategies to help you cope with the condition, such as: ? Exercising regularly. ? Maintaining a regular sleep schedule. ? Avoiding caffeine and large meals before bed.  Medicines. These may include: ? Medicines that help keep you awake and alert (stimulants) to fight daytime sleepiness. ? Medicines that treat depression (antidepressants). These may be used to treat cataplexy. ?  Sodium oxybate. This is a strong medicine to help you relax (sedative) that you may take at night. It can help control daytime sleepiness and cataplexy.  Follow these instructions at  home: Sleeping habits  Get about 8 hours of sleep every night.  Go to sleep and get up at about the same time every day.  Keep your bedroom dark, quiet, and comfortable.  When you feel very tired, take short naps. Schedule naps so that you take them at about the same time every day.  Tell your employer or teachers that you have narcolepsy. You may be able to adjust your schedule to include time for naps.  Before bedtime: ? Avoid bright lights and screens. ? Relax. Try activities like reading or taking a warm bath. Activity  Get at least 20 minutes of exercise every day. This will help you sleep better at night and reduce daytime sleepiness.  Avoid exercising within 3 hours of bedtime.  If you are sleepy, do not drive or use heavy machinery.  If possible, take a nap before driving.  Do not swim or go out on the water without a life jacket. Eating and drinking  Do not drink alcohol or caffeinated beverages within 4-5 hours of bedtime.  Do not eat a lot of food before bedtime. Eat meals at about the same times every day. General instructions  Take over-the-counter and prescription medicines only as told by your health care provider.  If directed, keep a sleep diary.  Tell your employer or teachers that you have narcolepsy. You may be able to adjust your schedule to include time for naps.  Do not use any products that contain nicotine or tobacco, such as cigarettes and e-cigarettes. If you need help quitting, ask your health care provider.  Keep all follow-up visits as told by your health care provider. This is important. Contact a health care provider if:  Your symptoms are not getting better.  You have increasingly high blood pressure (hypertension).  You have changes in your heart rhythm.  You are having a hard time determining what is real and what is not (psychosis). Get help right away if:  You hurt yourself during a sleep attack or an attack of  cataplexy.  You have chest pain.  You have trouble breathing. This information is not intended to replace advice given to you by your health care provider. Make sure you discuss any questions you have with your health care provider. Document Released: 11/12/2002 Document Revised: 11/15/2016 Document Reviewed: 11/15/2016 Elsevier Interactive Patient Education  [employer].

## 2017-10-18 NOTE — Progress Notes (Signed)
Subjective:  I acted as a Education administrator for Dr. Charlett Blake. [place], [place]  Patient ID: [name] [name], male    DOB: [birth year], 71 y.o.   MRN: [id number]  No chief complaint on file.   HPI  Patient is in today for a 6 week follow. Patient states he wakes up every night around the same time with dizziness and some confusion. No recent febrile illness or acute hospitalizations. Denies CP/palp/SOB/congestion/fevers/GI or GU c/o. Taking meds as prescribed. He continues to have very labile blood sugars from 90s to 500. Notes polyuria or polydipsia. Continues to struggle with headaches   Patient Care Team: [name], MD as PCP - General (Family Medicine) Pieter Partridge, DO as Consulting Physician (Neurology) [name], MD as Consulting Physician (Cardiology) Deboraha Sprang, MD as Consulting Physician (Cardiology) [place], Mike Gip, MD as Consulting Physician (Pulmonary Disease) Warden Fillers, MD as Consulting Physician (Ophthalmology)   Past Medical History:  Diagnosis Date  . ACP (advance care planning) 05/11/2015  . Advance care planning 05/01/2015  . Anemia    hemoglobin 7.4, iron deficiency, January, 2011, 2 unit transfusion, endoscopy normal, capsule endoscopy February, 2011 no small bowel abnormalities.   Most likely source gastric erosions, followed by GI  . Anxiety   . CAD (coronary artery disease)    A. CABG in 2000,status post cardiac cath in 2006, 2009 ....continued chest pain and SOB despite oral medication adjestments including Ranexa. B. Cath November 2009/ mRCA - 2.75 x 23 Abbott Xience V drug-eluting stent ...11/26/2008 to distal  RCA leading to acute marginal.  C. Cath 07/2012 for CP - stable anatomy, med rx. d. cath 2015 and 05/30/2015 stable anatomy, consider Myoview if has CP again  . Cerebral ischemia    MRI November, 2010, chronic microvascular ischemia  . Chronic diastolic CHF (congestive heart failure) ([place]) 03/13/2016  . Concussion   . Depression    Bipolar  . Diabetes mellitus type II, uncontrolled ([place])   . Diabetes mellitus without complication ([place])   . Diarrhea 02/15/2016  . Dizziness   . Edema   . Ejection fraction    EF 60%, echo, July 31, 2012  . Falling episodes    these have occurred in the past and again recurring 2011  . Family history of adverse reaction to anesthesia    "mother died during bypass surgery but not sure if it has to do with anesthesia"  . Gastric ulcer   . GERD (gastroesophageal reflux disease)   . H/O medication noncompliance    Due to loss of insurance  . Hard of hearing   . Headache 01/25/2016  . Hx of CABG    2000,  / one median sternotomy suture broken her chest x-ray November, 2010, no clinical significance  . Hyperlipidemia   . Hypertension    pt. denies  . Iron deficiency anemia   . Low back pain 06/12/2009   Qualifier: Diagnosis of  By: [name]   . Nausea with vomiting 01/25/2016  . Nephrolithiasis   . OSA (obstructive sleep apnea)   . Pain in limb 06/12/2009   Qualifier: Diagnosis of  By: [name]   . Palpitations    event recorder showed sinus rhythm  . RBBB (right bundle branch block)   . Restless leg   . Right knee pain 01/07/2015  . Shortness of breath    CPX April, 2011, mild functional limitation, no clear pulmonary or cardiac limitation, possible deconditioning and  mild chronotropic incompetence( peak heart rate 130)  . Spondylosis    C5-6, C6-7 MRI 2010  . Syncope 03/2016  . Thyroid disease   . Tubulovillous adenoma of colon 2007  . Vitamin D deficiency 05/17/2017  . Wears glasses     Past Surgical History:  Procedure Laterality Date  . COLONOSCOPY    . CORONARY ARTERY BYPASS GRAFT     2000  . ESOPHAGOGASTRODUODENOSCOPY    . NASAL SEPTUM SURGERY     UP3  . PERCUTANEOUS CORONARY STENT INTERVENTION (PCI-S)  10/2008   mRCA PCI  2.75 x 23 Abbott Xience V drug-eluting stent     Family History  Problem Relation Age of Onset  . Pancreatic cancer Brother    . Diabetes Brother   . Coronary artery disease Brother   . Stroke Brother   . Diabetes Brother   . Diabetes Mother   . Heart failure Mother   . Heart failure Father   . Hypothyroidism Brother   . Coronary artery disease Brother   . Other Brother        colon surgery  . Heart attack Unknown        Nephew  . Irregular heart beat Daughter   . Cancer Maternal Grandmother        unknown     Social History   Socioeconomic History  . Marital status: Married    Spouse name: Not on file  . Number of children: 4  . Years of education: 40  . Highest education level: Not on file  Social Needs  . Financial resource strain: Not on file  . Food insecurity - worry: Not on file  . Food insecurity - inability: Not on file  . Transportation needs - medical: Not on file  . Transportation needs - non-medical: Not on file  Occupational History  . Occupation: retired  Tobacco Use  . Smoking status: Never Smoker  . Smokeless tobacco: Never Used  Substance and Sexual Activity  . Alcohol use: No    Alcohol/week: 0.0 oz    Comment: stopped drinking in 1998  . Drug use: No  . Sexual activity: Not on file  Other Topics Concern  . Not on file  Social History Narrative   Patient is right handed.   Patient does not drink caffeine.    Outpatient Medications Prior to Visit  Medication Sig Dispense Refill  . albuterol (PROVENTIL HFA;VENTOLIN HFA) 108 (90 Base) MCG/ACT inhaler Inhale 2 puffs into the lungs every 6 (six) hours as needed for wheezing or shortness of breath. 1 Inhaler 0  . amiodarone (PACERONE) 400 MG tablet Take 200 mg by mouth daily.    Marland Kitchen atorvastatin (LIPITOR) 40 MG tablet TAKE 1 & 1/2 TABLETS (60 MG TOTAL) BY MOUTH DAILY. 45 tablet 3  . blood glucose meter kit and supplies KIT Dispense based on patient and insurance preference. Use up to four times daily as directed. (FOR ICD-9 250.00, 250.01). LIBRE 1 each 0  . diclofenac sodium (VOLTAREN) 1 % GEL Apply 2 g topically daily  as needed (for pain in affected area).    . divalproex (DEPAKOTE ER) 500 MG 24 hr tablet TAKE 1 TABLET (500 MG TOTAL) BY MOUTH DAILY. 30 tablet 2  . gabapentin (NEURONTIN) 300 MG capsule Take 2 capsules (600 mg) by mouth  every morning, 1 capsule (300 mg) by mouth  at noon, and 2 capsules (600 mg) by mouth at night    . glucose blood (ACCU-CHEK AVIVA) test strip  Use as directed to check blood sugar 4 times daily.  DX E11.9 200 each 6  . insulin aspart (NOVOLOG) 100 UNIT/ML injection Inject 10-20 Units into the skin 3 (three) times daily with meals. Use 10-17 unit 3 times daily before meals 15 mL 4  . Insulin Glargine (TOUJEO SOLOSTAR) 300 UNIT/ML SOPN Inject 50 Units into the skin at bedtime. 4.5 mL 4  . Insulin Pen Needle 31G X 5 MM MISC UAD with insulin for DM 100 each 11  . isosorbide mononitrate (IMDUR) 60 MG 24 hr tablet TAKE TWO TABLETS BY MOUTH DAILY 180 tablet 0  . levothyroxine (SYNTHROID, LEVOTHROID) 88 MCG tablet TAKE 1 TABLET (88 MCG TOTAL) BY MOUTH DAILY BEFORE BREAKFAST. 30 tablet 6  . loperamide (IMODIUM A-D) 2 MG tablet Take 1 tablet (2 mg total) by mouth 4 (four) times daily as needed for diarrhea or loose stools. 30 tablet 0  . meclizine (ANTIVERT) 12.5 MG tablet Take 1 tablet (12.5 mg total) by mouth 3 (three) times daily as needed for dizziness. 10 tablet 0  . metoprolol succinate (TOPROL-XL) 50 MG 24 hr tablet TAKE 1&1/2 TABLETS BY MOUTH ONCE A DAY FOR A TOTAL OF 75 MG. 45 tablet 6  . midodrine (PROAMATINE) 10 MG tablet TAKE 1 TABLET (10 MG TOTAL) BY MOUTH 3 TIMES DAILY. 90 tablet 9  . nitroGLYCERIN (NITROSTAT) 0.4 MG SL tablet Place 1 tablet (0.4 mg total) under the tongue every 5 (five) minutes as needed for chest pain. 25 tablet 1  . ondansetron (ZOFRAN ODT) 4 MG disintegrating tablet Take 1 tablet (4 mg total) by mouth every 8 (eight) hours as needed for nausea or vomiting. 10 tablet 1  . pantoprazole (PROTONIX) 40 MG tablet TAKE 1 TABLET BY MOUTH TWICE DAILY FOR 2 WEEKS THEN  DROP TO ONCE DAILY 45 tablet 1  . pramipexole (MIRAPEX) 1 MG tablet TAKE 1/2 TO 1 TABLET BY MOUTH AT BEDTIME 30 tablet 2  . ranolazine (RANEXA) 500 MG 12 hr tablet Take 1 tablet (500 mg total) by mouth 2 (two) times daily. 60 tablet 3  . sertraline (ZOLOFT) 100 MG tablet TAKE 1 TABLET (100 MG TOTAL) BY MOUTH 2 (TWO) TIMES DAILY. 180 tablet 1  . tiZANidine (ZANAFLEX) 4 MG tablet Take 1 tablet (4 mg total) by mouth every 8 (eight) hours as needed for muscle spasms. 90 tablet 2  . topiramate (TOPAMAX) 50 MG tablet TAKE 1 TABLET (50 MG) BY MOUTH AT BEDTIME. 30 tablet 3  . Vitamin D, Ergocalciferol, (DRISDOL) 50000 units CAPS capsule Take 1 capsule (50,000 Units total) by mouth every 7 (seven) days. 12 capsule 2  . warfarin (COUMADIN) 5 MG tablet TAKE AS DIRECTED BY COUMADIN CLINIC 45 tablet 3   No facility-administered medications prior to visit.     Allergies  Allergen Reactions  . Morphine Other (See Comments)    hallucinations  . Shellfish-Derived Products Itching    Review of Systems  Constitutional: Positive for malaise/fatigue. Negative for fever.  HENT: Negative for congestion.   Eyes: Negative for blurred vision.  Respiratory: Negative for cough and shortness of breath.   Cardiovascular: Negative for chest pain, palpitations and leg swelling.  Gastrointestinal: Negative for vomiting.  Genitourinary: Positive for frequency.  Musculoskeletal: Positive for back pain.  Skin: Negative for rash.  Neurological: Negative for loss of consciousness and headaches.  Psychiatric/Behavioral: The patient is nervous/anxious and has insomnia.        Objective:    Physical Exam  Constitutional: He is  oriented to person, place, and time. He appears well-developed and well-nourished. No distress.  HENT:  Head: Normocephalic and atraumatic.  Eyes: Conjunctivae are normal.  Neck: Normal range of motion. No thyromegaly present.  Cardiovascular: Normal rate.  Murmur heard. Pulmonary/Chest:  Effort normal and breath sounds normal. He has no wheezes.  Abdominal: Soft. Bowel sounds are normal. There is no tenderness.  Musculoskeletal: Normal range of motion. He exhibits no edema or deformity.  Neurological: He is alert and oriented to person, place, and time.  Skin: Skin is warm and dry. He is not diaphoretic.  Psychiatric: He has a normal mood and affect.    BP 116/68 (BP Location: Left Arm, Patient Position: Sitting, Cuff Size: Normal)   Pulse (!) 56   Temp (!) 97.4 F (36.3 C) (Oral)   Resp 18   Wt 171 lb (77.6 kg)   SpO2 97%   BMI 25.25 kg/m  Wt Readings from Last 3 Encounters:  10/18/17 171 lb (77.6 kg)  10/03/17 167 lb (75.8 kg)  09/05/17 175 lb (79.4 kg)   BP Readings from Last 3 Encounters:  10/18/17 116/68  10/03/17 (!) 142/86  09/05/17 128/70     Immunization History  Administered Date(s) Administered  . Influenza Split 09/21/2012  . Influenza Whole 10/16/2007, 10/01/2008, 09/23/2009, 09/22/2010  . Influenza, High Dose Seasonal PF 09/08/2017  . Influenza,inj,Quad PF,6+ Mos 08/22/2015, 08/22/2015, 08/24/2016  . Influenza-Unspecified 09/04/2014  . Pneumococcal Conjugate-13 08/24/2016  . Pneumococcal Polysaccharide-23 12/18/2008, 01/03/2015  . Td 12/18/2007  . Zoster 11/25/2010, 11/30/2012    Health Maintenance  Topic Date Due  . Hepatitis C Screening  10-07-1946  . OPHTHALMOLOGY EXAM  06/20/2012  . URINE MICROALBUMIN  04/30/2016  . HEMOGLOBIN A1C  11/16/2017  . TETANUS/TDAP  12/17/2017  . FOOT EXAM  03/17/2018  . COLONOSCOPY  02/11/2019  . INFLUENZA VACCINE  Completed  . PNA vac Low Risk Adult  Completed    Lab Results  Component Value Date   WBC 8.1 05/17/2017   HGB 13.4 05/17/2017   HCT 39.2 05/17/2017   PLT 195.0 05/17/2017   GLUCOSE 239 (H) 05/17/2017   CHOL 99 (L) 09/27/2017   TRIG 168 (H) 09/27/2017   HDL 30 (L) 09/27/2017   LDLDIRECT 25.0 02/09/2016   LDLCALC 35 09/27/2017   ALT 14 09/27/2017   AST 11 09/27/2017   NA 141  05/17/2017   K 4.4 05/17/2017   CL 103 05/17/2017   CREATININE 1.04 05/17/2017   BUN 13 05/17/2017   CO2 29 05/17/2017   TSH 1.810 09/27/2017   PSA 2.39 11/25/2010   INR 3.0 10/13/2017   HGBA1C 9.7 (H) 05/17/2017   MICROALBUR 1.1 05/01/2015    Lab Results  Component Value Date   TSH 1.810 09/27/2017   Lab Results  Component Value Date   WBC 8.1 05/17/2017   HGB 13.4 05/17/2017   HCT 39.2 05/17/2017   MCV 84.7 05/17/2017   PLT 195.0 05/17/2017   Lab Results  Component Value Date   NA 141 05/17/2017   K 4.4 05/17/2017   CO2 29 05/17/2017   GLUCOSE 239 (H) 05/17/2017   BUN 13 05/17/2017   CREATININE 1.04 05/17/2017   BILITOT 0.6 09/27/2017   ALKPHOS 80 09/27/2017   AST 11 09/27/2017   ALT 14 09/27/2017   PROT 6.8 09/27/2017   ALBUMIN 4.4 09/27/2017   CALCIUM 9.8 05/17/2017   ANIONGAP 11 02/13/2017   GFR 75.33 05/17/2017   Lab Results  Component Value Date   CHOL  99 (L) 09/27/2017   Lab Results  Component Value Date   HDL 30 (L) 09/27/2017   Lab Results  Component Value Date   LDLCALC 35 09/27/2017   Lab Results  Component Value Date   TRIG 168 (H) 09/27/2017   Lab Results  Component Value Date   CHOLHDL 3.3 09/27/2017   Lab Results  Component Value Date   HGBA1C 9.7 (H) 05/17/2017         Assessment & Plan:   Problem List Items Addressed This Visit    Essential hypertension (Chronic)    Well controlled, no changes to meds. Encouraged heart healthy diet such as the DASH diet and exercise as tolerated.       Relevant Orders   CBC   TSH   Comprehensive metabolic panel   Diabetes mellitus type II, uncontrolled (HCC) (Chronic)    hgba1c running high, low of 90s. High of 500, typically numbers in the 100s to 200s minimize simple carbs. Increase exercise as tolerated. Continue current meds, using Novolog with meals has been increased from 42 to 52 tid and Tuojeo 52 to 60 units qhs      Relevant Orders   Hemoglobin A1c   Hypothyroidism     On Levothyroxine, continue to monitor      HLD (hyperlipidemia)    Tolerating statin, encouraged heart healthy diet, avoid trans fats, minimize simple carbs and saturated fats. Increase exercise as tolerated      Relevant Orders   Lipid panel   Vitamin D deficiency    Labs reveal deficiency. Start on Vitamin D 50000 IU caps, 1 cap po weekly x 12 weeks. Disp #4 with 4 rf. Also take daily Vitamin D over the counter. If already taking a daily supplement increase by 1000 IU daily and if not start Vitamin D 2000 IU daily.       Relevant Orders   VITAMIN D 25 Hydroxy (Vit-D Deficiency, Fractures)   Hypersomnolence - Primary    Is struggling with worsening fatigue despite using his CPAP. Falls asleep during conversations more and more. Consider Narcolepsy will refer back to neurology for further consideration      Relevant Orders   Ambulatory referral to Neurology      I am having Ren A. [place] maintain his tiZANidine, gabapentin, diclofenac sodium, topiramate, meclizine, albuterol, ondansetron, loperamide, Vitamin D (Ergocalciferol), amiodarone, levothyroxine, sertraline, midodrine, divalproex, atorvastatin, metoprolol succinate, isosorbide mononitrate, pramipexole, warfarin, Insulin Pen Needle, nitroGLYCERIN, pantoprazole, Insulin Glargine, insulin aspart, ranolazine, blood glucose meter kit and supplies, and glucose blood.  No orders of the defined types were placed in this encounter.   CMA served as Education administrator during this visit. History, Physical and Plan performed by medical provider. Documentation and orders reviewed and attested to.  Penni Homans, MD

## 2017-10-18 NOTE — Assessment & Plan Note (Signed)
Tolerating statin, encouraged heart healthy diet, avoid trans fats, minimize simple carbs and saturated fats. Increase exercise as tolerated 

## 2017-10-18 NOTE — Assessment & Plan Note (Signed)
Is struggling with worsening fatigue despite using his CPAP. Falls asleep during conversations more and more. Consider Narcolepsy will refer back to neurology for further consideration

## 2017-10-18 NOTE — Assessment & Plan Note (Signed)
hgba1c running high, low of 90s. High of 500, typically numbers in the 100s to 200s minimize simple carbs. Increase exercise as tolerated. Continue current meds, using Novolog with meals has been increased from 42 to 52 tid and Tuojeo 52 to 60 units qhs

## 2017-10-18 NOTE — Assessment & Plan Note (Signed)
Labs reveal deficiency. Start on Vitamin D 50000 IU caps, 1 cap po weekly x 12 weeks. Disp #4 with 4 rf. Also take daily Vitamin D over the counter. If already taking a daily supplement increase by 1000 IU daily and if not start Vitamin D 2000 IU daily.  

## 2017-10-18 NOTE — Assessment & Plan Note (Signed)
Well controlled, no changes to meds. Encouraged heart healthy diet such as the DASH diet and exercise as tolerated.  °

## 2017-10-19 ENCOUNTER — Other Ambulatory Visit: Payer: Self-pay | Admitting: Family Medicine

## 2017-10-19 DIAGNOSIS — R7309 Other abnormal glucose: Secondary | ICD-10-CM

## 2017-10-19 DIAGNOSIS — E1165 Type 2 diabetes mellitus with hyperglycemia: Secondary | ICD-10-CM

## 2017-10-19 NOTE — Progress Notes (Signed)
Referral placed for Endocrinology. Mailed copy of lab results and orders to patient. Spoke with patient regarding results.

## 2017-11-04 ENCOUNTER — Ambulatory Visit (INDEPENDENT_AMBULATORY_CARE_PROVIDER_SITE_OTHER): Payer: Medicare PPO | Admitting: *Deleted

## 2017-11-04 ENCOUNTER — Other Ambulatory Visit: Payer: Self-pay | Admitting: Family Medicine

## 2017-11-04 ENCOUNTER — Other Ambulatory Visit: Payer: Self-pay | Admitting: Neurology

## 2017-11-04 DIAGNOSIS — I4891 Unspecified atrial fibrillation: Secondary | ICD-10-CM

## 2017-11-04 DIAGNOSIS — Z5181 Encounter for therapeutic drug level monitoring: Secondary | ICD-10-CM | POA: Diagnosis not present

## 2017-11-04 DIAGNOSIS — Z7901 Long term (current) use of anticoagulants: Secondary | ICD-10-CM

## 2017-11-04 DIAGNOSIS — I480 Paroxysmal atrial fibrillation: Secondary | ICD-10-CM

## 2017-11-04 LAB — POCT INR: INR: 2.2

## 2017-11-04 MED FILL — METOPROLOL SUCC ER 50 MG TA: 50 | 30 days supply | Qty: 45 | Fill #3

## 2017-11-04 MED FILL — TOPIRAMATE 25 MG TAB: 25 | 30 days supply | Qty: 108 | Fill #1

## 2017-11-04 NOTE — Patient Instructions (Signed)
Continue same dose of coumadin  1 tablet everyday except 1.5 tablets on Mondays and Fridays.  Repeat INR in 4 weeks. Remain consistent with eating 2 servings each week of leafy green vegetable. Call us with any medication changes [phone] [facility]. Main # [phone].

## 2017-11-16 ENCOUNTER — Telehealth: Payer: Self-pay | Admitting: Family Medicine

## 2017-11-16 ENCOUNTER — Other Ambulatory Visit: Payer: Self-pay | Admitting: Family Medicine

## 2017-11-16 NOTE — Telephone Encounter (Signed)
Copied from [facility] #[id number]. Topic: General - Other >> Nov 16, 2017 12:47 PM Synthia Innocent wrote: Reason for CRM: Requesting samples of insulin aspart (NOVOLOG) 100 UNIT/ML injection and Insulin Glargine (TOUJEO SOLOSTAR) 300 UNIT/ML SOPN

## 2017-11-17 NOTE — Telephone Encounter (Signed)
Pt. Requesting samples. Was not sure if you do that.Thanks

## 2017-11-17 NOTE — Telephone Encounter (Signed)
Pt's wife calling to check on status of refills. Notified pt's wife that the request was given to the provider and waiting for a response. Pt's wife verbalized understanding.

## 2017-11-21 ENCOUNTER — Ambulatory Visit: Payer: Medicare PPO | Admitting: Endocrinology

## 2017-11-21 ENCOUNTER — Telehealth: Payer: Self-pay | Admitting: Family Medicine

## 2017-11-21 ENCOUNTER — Encounter: Payer: Self-pay | Admitting: Endocrinology

## 2017-11-21 DIAGNOSIS — R209 Unspecified disturbances of skin sensation: Secondary | ICD-10-CM

## 2017-11-21 DIAGNOSIS — E11319 Type 2 diabetes mellitus with unspecified diabetic retinopathy without macular edema: Secondary | ICD-10-CM

## 2017-11-21 DIAGNOSIS — R51 Headache: Secondary | ICD-10-CM | POA: Diagnosis not present

## 2017-11-21 DIAGNOSIS — M542 Cervicalgia: Secondary | ICD-10-CM

## 2017-11-21 DIAGNOSIS — IMO0001 Reserved for inherently not codable concepts without codable children: Secondary | ICD-10-CM

## 2017-11-21 DIAGNOSIS — R519 Headache, unspecified: Secondary | ICD-10-CM

## 2017-11-21 MED ORDER — INSULIN ASPART 100 UNIT/ML ~~LOC~~ SOLN: 45.0000 [IU] | Freq: Three times a day (TID) | SUBCUTANEOUS | 4 refills | Status: DC

## 2017-11-21 MED ORDER — INSULIN GLARGINE 300 UNIT/ML ~~LOC~~ SOPN: 40.0000 [IU] | PEN_INJECTOR | Freq: Every day | SUBCUTANEOUS | 4 refills | Status: DC

## 2017-11-21 NOTE — Patient Instructions (Addendum)
good diet and exercise significantly improve the control of your diabetes.  please let me know if you wish to be referred to a dietician.  high blood sugar is very risky to your health.  you should see an eye doctor and dentist every year.  It is very important to get all recommended vaccinations.  Controlling your blood pressure and cholesterol drastically reduces the damage diabetes does to your body.  Those who smoke should quit.  Please discuss these with your doctor.  check your blood sugar 4 times a day.  vary the time of day when you check, between before the 3 meals, and at bedtime.  also check if you have symptoms of your blood sugar being too high or too low.  please keep a record of the readings and bring it to your next appointment here (or you can bring the meter itself).  You can write it on any piece of paper.  please call us sooner if your blood sugar goes below 70, or if you have a lot of readings over 200. Please take the insulin numbers listed below.  Please come back for a follow-up appointment in 1 month.

## 2017-11-21 NOTE — Progress Notes (Signed)
Subjective:    Patient ID: [name] [name], male    DOB: 26-Apr-1946, 71 y.o.   MRN: [id number]  HPI pt is referred by Dr Charlett Blake, for diabetes.  Pt states DM was dx'ed in 1989; he has mild neuropathy of the lower extremities; he has associated renal insuff, CAD and PAD; he has been on insulin since 2008; pt says his diet is fair, but exercise is limited by health problems; he has never had pancreatitis, pancreatic surgery, severe hypoglycemia or DKA.  Wife says he misses the insulin, due to cost.  He has hypoglycemia approx once every 3 mos, and these episodes are mild.  These happen in the middle of the night.  It is in general higher as the day goes on.  However, he says novolog works better than regular.   Past Medical History:  Diagnosis Date  . ACP (advance care planning) 05/11/2015  . Advance care planning 05/01/2015  . Anemia    hemoglobin 7.4, iron deficiency, January, 2011, 2 unit transfusion, endoscopy normal, capsule endoscopy February, 2011 no small bowel abnormalities.   Most likely source gastric erosions, followed by GI  . Anxiety   . CAD (coronary artery disease)    A. CABG in 2000,status post cardiac cath in 2006, 2009 ....continued chest pain and SOB despite oral medication adjestments including Ranexa. B. Cath November 2009/ mRCA - 2.75 x 23 Abbott Xience V drug-eluting stent ...11/26/2008 to distal  RCA leading to acute marginal.  C. Cath 07/2012 for CP - stable anatomy, med rx. d. cath 2015 and 05/30/2015 stable anatomy, consider Myoview if has CP again  . Cerebral ischemia    MRI November, 2010, chronic microvascular ischemia  . Chronic diastolic CHF (congestive heart failure) ([place]) 03/13/2016  . Concussion   . Depression    Bipolar  . Diabetes mellitus type II, uncontrolled ([place])   . Diabetes mellitus without complication ([place])   . Diarrhea 02/15/2016  . Dizziness   . Edema   . Ejection fraction    EF 60%, echo, July 31, 2012  . Falling episodes    these have occurred  in the past and again recurring 2011  . Family history of adverse reaction to anesthesia    "mother died during bypass surgery but not sure if it has to do with anesthesia"  . Gastric ulcer   . GERD (gastroesophageal reflux disease)   . H/O medication noncompliance    Due to loss of insurance  . Hard of hearing   . Headache 01/25/2016  . Hx of CABG    2000,  / one median sternotomy suture broken her chest x-ray November, 2010, no clinical significance  . Hyperlipidemia   . Hypertension    pt. denies  . Iron deficiency anemia   . Low back pain 06/12/2009   Qualifier: Diagnosis of  By: [name]   . Nausea with vomiting 01/25/2016  . Nephrolithiasis   . OSA (obstructive sleep apnea)   . Pain in limb 06/12/2009   Qualifier: Diagnosis of  By: [name]   . Palpitations    event recorder showed sinus rhythm  . RBBB (right bundle branch block)   . Restless leg   . Right knee pain 01/07/2015  . Shortness of breath    CPX April, 2011, mild functional limitation, no clear pulmonary or cardiac limitation, possible deconditioning and mild chronotropic incompetence( peak heart rate 130)  . Spondylosis    C5-6, C6-7 MRI 2010  .  Syncope 03/2016  . Thyroid disease   . Tubulovillous adenoma of colon 2007  . Vitamin D deficiency 05/17/2017  . Wears glasses     Past Surgical History:  Procedure Laterality Date  . ANTERIOR CERVICAL DECOMP/DISCECTOMY FUSION N/A 05/31/2016   Procedure: ANTERIOR CERVICAL DECOMPRESSION/DISCECTOMY FUSION CERVICAL FIVE-SIX,CERVICAL SIX-SEVEN;  Surgeon: [name], MD;  Location: [facility];  Service: Neurosurgery;  Laterality: N/A;  . CARDIAC CATHETERIZATION N/A 05/30/2015   Procedure: Left Heart Cath and Coronary Angiography;  Surgeon: [name], MD;  Location: [facility];  Service: Cardiovascular;  Laterality: N/A;  . COLONOSCOPY    . CORONARY ARTERY BYPASS GRAFT     2000  . ESOPHAGOGASTRODUODENOSCOPY    . LEFT HEART CATHETERIZATION WITH  CORONARY/GRAFT ANGIOGRAM N/A 08/01/2012   Procedure: LEFT HEART CATHETERIZATION WITH Beatrix Fetters;  Surgeon: [name], MD;  Location: [facility];  Service: Cardiovascular;  Laterality: N/A;  . LEFT HEART CATHETERIZATION WITH CORONARY/GRAFT ANGIOGRAM N/A 01/03/2015   Procedure: LEFT HEART CATHETERIZATION WITH Beatrix Fetters;  Surgeon: [name], MD;  Location: [facility] CATH LAB;  Service: Cardiovascular;  Laterality: N/A;  . NASAL SEPTUM SURGERY     UP3  . PERCUTANEOUS CORONARY STENT INTERVENTION (PCI-S)  10/2008   mRCA PCI  2.75 x 23 Abbott Xience V drug-eluting stent     Social History   Socioeconomic History  . Marital status: Married    Spouse name: Not on file  . Number of children: 4  . Years of education: 66  . Highest education level: Not on file  Social Needs  . Financial resource strain: Not on file  . Food insecurity - worry: Not on file  . Food insecurity - inability: Not on file  . Transportation needs - medical: Not on file  . Transportation needs - non-medical: Not on file  Occupational History  . Occupation: retired  Tobacco Use  . Smoking status: Never Smoker  . Smokeless tobacco: Never Used  Substance and Sexual Activity  . Alcohol use: No    Alcohol/week: 0.0 oz    Comment: stopped drinking in 1998  . Drug use: No  . Sexual activity: Not on file  Other Topics Concern  . Not on file  Social History Narrative   Patient is right handed.   Patient does not drink caffeine.    Current Outpatient Medications on File Prior to Visit  Medication Sig Dispense Refill  . albuterol (PROVENTIL HFA;VENTOLIN HFA) 108 (90 Base) MCG/ACT inhaler Inhale 2 puffs into the lungs every 6 (six) hours as needed for wheezing or shortness of breath. 1 Inhaler 0  . amiodarone (PACERONE) 400 MG tablet Take 200 mg by mouth daily.    Marland Kitchen atorvastatin (LIPITOR) 40 MG tablet TAKE 1 & 1/2 TABLETS (60 MG TOTAL) BY MOUTH DAILY. 45 tablet 3  . blood glucose meter  kit and supplies KIT Dispense based on patient and insurance preference. Use up to four times daily as directed. (FOR ICD-9 250.00, 250.01). LIBRE 1 each 0  . diclofenac sodium (VOLTAREN) 1 % GEL Apply 2 g topically daily as needed (for pain in affected area).    . divalproex (DEPAKOTE ER) 500 MG 24 hr tablet TAKE 1 TABLET (500 MG TOTAL) BY MOUTH DAILY. 30 tablet 2  . glucose blood (ACCU-CHEK AVIVA) test strip Use as directed to check blood sugar 4 times daily.  DX E11.9 200 each 6  . Insulin Pen Needle 31G X 5 MM MISC UAD with insulin  for DM 100 each 11  . isosorbide mononitrate (IMDUR) 60 MG 24 hr tablet TAKE TWO TABLETS BY MOUTH DAILY 180 tablet 0  . levothyroxine (SYNTHROID, LEVOTHROID) 88 MCG tablet TAKE 1 TABLET (88 MCG TOTAL) BY MOUTH DAILY BEFORE BREAKFAST. 30 tablet 6  . loperamide (IMODIUM A-D) 2 MG tablet Take 1 tablet (2 mg total) by mouth 4 (four) times daily as needed for diarrhea or loose stools. 30 tablet 0  . metoprolol succinate (TOPROL-XL) 50 MG 24 hr tablet TAKE 1&1/2 TABLETS BY MOUTH ONCE A DAY FOR A TOTAL OF 75 MG. 45 tablet 6  . midodrine (PROAMATINE) 10 MG tablet TAKE 1 TABLET (10 MG TOTAL) BY MOUTH 3 TIMES DAILY. 90 tablet 9  . nitroGLYCERIN (NITROSTAT) 0.4 MG SL tablet Place 1 tablet (0.4 mg total) under the tongue every 5 (five) minutes as needed for chest pain. 25 tablet 1  . ondansetron (ZOFRAN ODT) 4 MG disintegrating tablet Take 1 tablet (4 mg total) by mouth every 8 (eight) hours as needed for nausea or vomiting. 10 tablet 1  . pantoprazole (PROTONIX) 40 MG tablet TAKE 1 TABLET BY MOUTH TWICE DAILY FOR 2 WEEKS THEN DROP TO ONCE DAILY 45 tablet 1  . pramipexole (MIRAPEX) 1 MG tablet TAKE 1/2 TO 1 TABLET BY MOUTH AT BEDTIME 30 tablet 2  . ranolazine (RANEXA) 500 MG 12 hr tablet Take 1 tablet (500 mg total) by mouth 2 (two) times daily. 60 tablet 3  . sertraline (ZOLOFT) 100 MG tablet TAKE 1 TABLET (100 MG TOTAL) BY MOUTH 2 (TWO) TIMES DAILY. 180 tablet 1  . topiramate  (TOPAMAX) 50 MG tablet TAKE 1 TABLET (50 MG) BY MOUTH AT BEDTIME. 30 tablet 3  . Vitamin D, Ergocalciferol, (DRISDOL) 50000 units CAPS capsule Take 1 capsule (50,000 Units total) by mouth every 7 (seven) days. 12 capsule 2  . warfarin (COUMADIN) 5 MG tablet TAKE AS DIRECTED BY COUMADIN CLINIC 45 tablet 3  . gabapentin (NEURONTIN) 300 MG capsule Take 2 capsules (600 mg) by mouth  every morning, 1 capsule (300 mg) by mouth  at noon, and 2 capsules (600 mg) by mouth at night    . meclizine (ANTIVERT) 12.5 MG tablet Take 1 tablet (12.5 mg total) by mouth 3 (three) times daily as needed for dizziness. (Patient not taking: Reported on 11/21/2017) 10 tablet 0  . tiZANidine (ZANAFLEX) 4 MG tablet Take 1 tablet (4 mg total) by mouth every 8 (eight) hours as needed for muscle spasms. (Patient not taking: Reported on 11/21/2017) 90 tablet 2   No current facility-administered medications on file prior to visit.     Allergies  Allergen Reactions  . Morphine Other (See Comments)    hallucinations  . Shellfish-Derived Products Itching    Family History  Problem Relation Age of Onset  . Pancreatic cancer Brother   . Diabetes Brother   . Coronary artery disease Brother   . Stroke Brother   . Diabetes Brother   . Diabetes Mother   . Heart failure Mother   . Heart failure Father   . Hypothyroidism Brother   . Coronary artery disease Brother   . Other Brother        colon surgery  . Heart attack Unknown        Nephew  . Irregular heart beat Daughter   . Cancer Maternal Grandmother        unknown     BP 132/80   Pulse 60   Ht _0  (1.753  m)   Wt 172 lb (78 kg)   SpO2 97%   BMI 25.40 kg/m    Review of Systems denies weight loss, blurry vision, sob, n/v, excessive diaphoresis, and rhinorrhea.  He has frequent headache, depression, memory loss, leg cramps, cold intolerance, easy bruising, and nocturia.  He has intermitt chest pain.    Objective:   Physical Exam VS: see vs page GEN: no  distress HEAD: head: no deformity eyes: no periorbital swelling, no proptosis external nose and ears are normal mouth: no lesion seen NECK: supple, thyroid is not enlarged CHEST WALL: no deformity LUNGS: clear to auscultation CV: reg rate and rhythm, no murmur ABD: abdomen is soft, nontender.  no hepatosplenomegaly.  not distended.  no hernia MUSCULOSKELETAL: muscle bulk and strength are grossly normal.  no obvious joint swelling.  gait is normal and steady EXTEMITIES: no deformity.  no ulcer on the feet.  feet are of normal color and temp.  no edema PULSES: dorsalis pedis intact bilat.  no carotid bruit NEURO:  cn 2-12 grossly intact.   readily moves all 4's.  sensation is intact to touch on the feet SKIN:  Normal texture and temperature.  No rash or suspicious lesion is visible.   NODES:  None palpable at the neck.  PSYCH: alert, well-oriented.  Does not appear anxious nor depressed.    Lab Results  Component Value Date   CREATININE 1.33 10/18/2017   BUN 15 10/18/2017   NA 134 (L) 10/18/2017   K 4.1 10/18/2017   CL 103 10/18/2017   CO2 24 10/18/2017    Lab Results  Component Value Date   HGBA1C 11.4 (H) 10/18/2017   I personally reviewed electrocardiogram tracing (10/03/17): Indication: chest pain Impression: SB.  No MI.  RBBB Compared to 03/28/17: SB is resolved  I have reviewed outside records, and summarized: Pt was noted to have elevated a1c, and referred here.  Numerous health probs were addressed.  Main symptom was fatigue.       Assessment & Plan:  Insulin-requiring type 2 DM, new to me: severe exacerbation.  lean body habitus: this raises the possibility he is evolving type 1.  Patient Instructions  good diet and exercise significantly improve the control of your diabetes.  please let me know if you wish to be referred to a dietician.  high blood sugar is very risky to your health.  you should see an eye doctor and dentist every year.  It is very important to  get all recommended vaccinations.  Controlling your blood pressure and cholesterol drastically reduces the damage diabetes does to your body.  Those who smoke should quit.  Please discuss these with your doctor.  check your blood sugar 4 times a day.  vary the time of day when you check, between before the 3 meals, and at bedtime.  also check if you have symptoms of your blood sugar being too high or too low.  please keep a record of the readings and bring it to your next appointment here (or you can bring the meter itself).  You can write it on any piece of paper.  please call us sooner if your blood sugar goes below 70, or if you have a lot of readings over 200. Please take the insulin numbers listed below.  Please come back for a follow-up appointment in 1 month.

## 2017-11-21 NOTE — Telephone Encounter (Signed)
Copied from [place] [id number]. Topic: Quick Communication - See Telephone Encounter >> Nov 21, 2017  4:16 PM Rosalin Hawking wrote: CRM for notification. See Telephone encounter for:  11/21/17.   Pt came in office wanting to know if his provider has any samples for insulin aspart (NOVOLOG) 100 UNIT/ML injection and  gabapentin (NEURONTIN) 300 MG capsule. Please advise.

## 2017-11-22 ENCOUNTER — Telehealth: Payer: Self-pay

## 2017-11-22 ENCOUNTER — Other Ambulatory Visit: Payer: Self-pay | Admitting: Endocrinology

## 2017-11-22 ENCOUNTER — Telehealth: Payer: Self-pay | Admitting: Endocrinology

## 2017-11-22 DIAGNOSIS — R209 Unspecified disturbances of skin sensation: Secondary | ICD-10-CM

## 2017-11-22 DIAGNOSIS — IMO0001 Reserved for inherently not codable concepts without codable children: Secondary | ICD-10-CM

## 2017-11-22 DIAGNOSIS — M542 Cervicalgia: Secondary | ICD-10-CM

## 2017-11-22 DIAGNOSIS — R51 Headache: Secondary | ICD-10-CM

## 2017-11-22 DIAGNOSIS — R519 Headache, unspecified: Secondary | ICD-10-CM

## 2017-11-22 MED ORDER — INSULIN ASPART 100 UNIT/ML ~~LOC~~ SOLN: 45.0000 [IU] | Freq: Three times a day (TID) | SUBCUTANEOUS | 4 refills | Status: DC

## 2017-11-22 NOTE — Telephone Encounter (Signed)
I corrected and resent 

## 2017-11-22 NOTE — Telephone Encounter (Signed)
Which dose is correct the 45 3x daily with meals or the 10/17 3x daily with meals? It appears the prescription auto corrected back?
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
## 2017-11-22 NOTE — Telephone Encounter (Signed)
[facility] need clarifation on medication Novalog please advise [phone]

## 2017-11-22 NOTE — Telephone Encounter (Signed)
Medication sent in. 

## 2017-11-23 ENCOUNTER — Ambulatory Visit: Payer: Medicare PPO | Admitting: Interventional Cardiology

## 2017-11-23 ENCOUNTER — Other Ambulatory Visit: Payer: Self-pay

## 2017-11-23 NOTE — Telephone Encounter (Signed)
[facility] called re: Need directions on Novalog script clarified-there are 2 sets of directions. Please call them at ph# [phone] to clarify

## 2017-11-23 NOTE — Telephone Encounter (Signed)
I called and clarified correct prescription with phramacy.

## 2017-11-24 MED FILL — ATORVASTATIN 40 MG TABLET: 40 | 30 days supply | Qty: 45 | Fill #0

## 2017-11-24 MED FILL — ISOSORBIDE MN ER 60 MG TAB: 60 | 90 days supply | Qty: 180 | Fill #0

## 2017-11-24 NOTE — Telephone Encounter (Signed)
We do not have samples of gabapentin   I called patient and left message to call back

## 2017-11-25 MED FILL — TOUJEO SOLOSTAR 300 UNITS/M: 300 | 34 days supply | Qty: 5 | Fill #0

## 2017-11-25 MED FILL — WARFARIN SODIUM 5 MG TABLET: 5 | 30 days supply | Qty: 45 | Fill #3

## 2017-11-25 MED FILL — RANEXA ER 500 MG TABLET: 500 | 30 days supply | Qty: 60 | Fill #1

## 2017-11-25 MED FILL — NovoLIN N 100 UNIT/ML SUSP: 100 | 25 days supply | Qty: 10 | Fill #1

## 2017-12-02 ENCOUNTER — Ambulatory Visit (INDEPENDENT_AMBULATORY_CARE_PROVIDER_SITE_OTHER): Payer: Medicare PPO | Admitting: *Deleted

## 2017-12-02 DIAGNOSIS — I4891 Unspecified atrial fibrillation: Secondary | ICD-10-CM | POA: Diagnosis not present

## 2017-12-02 DIAGNOSIS — Z7901 Long term (current) use of anticoagulants: Secondary | ICD-10-CM

## 2017-12-02 DIAGNOSIS — I480 Paroxysmal atrial fibrillation: Secondary | ICD-10-CM

## 2017-12-02 DIAGNOSIS — Z5181 Encounter for therapeutic drug level monitoring: Secondary | ICD-10-CM | POA: Diagnosis not present

## 2017-12-02 LAB — POCT INR: INR: 1.9

## 2017-12-02 NOTE — Patient Instructions (Signed)
Description   Today take 2 tablets, then Continue same dose of coumadin  1 tablet everyday except 1.5 tablets on Mondays and Fridays.  Repeat INR in 3 weeks. Remain consistent with eating 2 servings each week of leafy green vegetable. Call us with any medication changes [phone] [facility]. Main # [phone].

## 2017-12-06 DIAGNOSIS — S069XAA Unspecified intracranial injury with loss of consciousness status unknown, initial encounter: Secondary | ICD-10-CM

## 2017-12-06 DIAGNOSIS — S069X9A Unspecified intracranial injury with loss of consciousness of unspecified duration, initial encounter: Secondary | ICD-10-CM

## 2017-12-06 HISTORY — DX: Unspecified intracranial injury with loss of consciousness status unknown, initial encounter: S06.9XAA

## 2017-12-06 HISTORY — DX: Unspecified intracranial injury with loss of consciousness of unspecified duration, initial encounter: S06.9X9A

## 2017-12-08 ENCOUNTER — Other Ambulatory Visit: Payer: Self-pay | Admitting: Family Medicine

## 2017-12-08 MED FILL — MIDODRINE HCL 10 MG TABLET: 10 | 30 days supply | Qty: 90 | Fill #5

## 2017-12-08 MED FILL — METOPROLOL SUCC ER 50 MG TA: 50 | 30 days supply | Qty: 45 | Fill #4

## 2017-12-08 MED FILL — DIVALPROEX SOD ER 500 MG TA: 500 | 30 days supply | Qty: 30 | Fill #0

## 2017-12-08 MED FILL — TOPIRAMATE 25 MG TAB: 25 | 6 days supply | Qty: 24 | Fill #2

## 2017-12-09 MED FILL — PANTOPRAZOLE SOD DR 40 MG T: 40 | 30 days supply | Qty: 30 | Fill #0

## 2017-12-09 MED FILL — VIT D2 1.25 MG (50,000 UNIT: 1.25 MG | 84 days supply | Qty: 12 | Fill #0

## 2017-12-09 MED FILL — PRAMIPEXOLE 1 MG TABLET: 1 | 30 days supply | Qty: 30 | Fill #0

## 2017-12-11 ENCOUNTER — Emergency Department (HOSPITAL_BASED_OUTPATIENT_CLINIC_OR_DEPARTMENT_OTHER): Payer: Medicare PPO

## 2017-12-11 ENCOUNTER — Emergency Department (HOSPITAL_BASED_OUTPATIENT_CLINIC_OR_DEPARTMENT_OTHER)
Admission: EM | Admit: 2017-12-11 | Discharge: 2017-12-12 | Disposition: A | Discharge status: Home / Self Care | Payer: Medicare PPO | Attending: Emergency Medicine | Admitting: Emergency Medicine

## 2017-12-11 ENCOUNTER — Other Ambulatory Visit: Payer: Self-pay

## 2017-12-11 ENCOUNTER — Encounter (HOSPITAL_BASED_OUTPATIENT_CLINIC_OR_DEPARTMENT_OTHER): Payer: Self-pay | Admitting: Adult Health

## 2017-12-11 DIAGNOSIS — E039 Hypothyroidism, unspecified: Secondary | ICD-10-CM | POA: Insufficient documentation

## 2017-12-11 DIAGNOSIS — I208 Other forms of angina pectoris: Secondary | ICD-10-CM | POA: Diagnosis not present

## 2017-12-11 DIAGNOSIS — I110 Hypertensive heart disease with heart failure: Secondary | ICD-10-CM | POA: Insufficient documentation

## 2017-12-11 DIAGNOSIS — I5032 Chronic diastolic (congestive) heart failure: Secondary | ICD-10-CM | POA: Diagnosis not present

## 2017-12-11 DIAGNOSIS — I209 Angina pectoris, unspecified: Secondary | ICD-10-CM | POA: Insufficient documentation

## 2017-12-11 DIAGNOSIS — Z7901 Long term (current) use of anticoagulants: Secondary | ICD-10-CM | POA: Insufficient documentation

## 2017-12-11 DIAGNOSIS — Z79899 Other long term (current) drug therapy: Secondary | ICD-10-CM | POA: Insufficient documentation

## 2017-12-11 DIAGNOSIS — F419 Anxiety disorder, unspecified: Secondary | ICD-10-CM | POA: Diagnosis not present

## 2017-12-11 DIAGNOSIS — Z794 Long term (current) use of insulin: Secondary | ICD-10-CM | POA: Insufficient documentation

## 2017-12-11 DIAGNOSIS — I4510 Unspecified right bundle-branch block: Secondary | ICD-10-CM | POA: Diagnosis not present

## 2017-12-11 DIAGNOSIS — R001 Bradycardia, unspecified: Secondary | ICD-10-CM | POA: Diagnosis not present

## 2017-12-11 DIAGNOSIS — Z951 Presence of aortocoronary bypass graft: Secondary | ICD-10-CM | POA: Insufficient documentation

## 2017-12-11 DIAGNOSIS — E119 Type 2 diabetes mellitus without complications: Secondary | ICD-10-CM | POA: Insufficient documentation

## 2017-12-11 DIAGNOSIS — R079 Chest pain, unspecified: Secondary | ICD-10-CM | POA: Diagnosis not present

## 2017-12-11 LAB — CBC
HEMATOCRIT: 36.1 % — AB (ref 39.0–52.0)
Hemoglobin: 12.9 g/dL — ABNORMAL LOW (ref 13.0–17.0)
MCH: 30 pg (ref 26.0–34.0)
MCHC: 35.7 g/dL (ref 30.0–36.0)
MCV: 84 fL (ref 78.0–100.0)
Platelets: 206 10*3/uL (ref 150–400)
RBC: 4.3 MIL/uL (ref 4.22–5.81)
RDW: 14.3 % (ref 11.5–15.5)
WBC: 8.2 10*3/uL (ref 4.0–10.5)

## 2017-12-11 LAB — BASIC METABOLIC PANEL
ANION GAP: 7 (ref 5–15)
BUN: 17 mg/dL (ref 6–20)
CHLORIDE: 106 mmol/L (ref 101–111)
CO2: 22 mmol/L (ref 22–32)
Calcium: 8.9 mg/dL (ref 8.9–10.3)
Creatinine, Ser: 1.36 mg/dL — ABNORMAL HIGH (ref 0.61–1.24)
GFR calc Af Amer: 60 mL/min — ABNORMAL LOW (ref >60)
GFR, EST NON AFRICAN AMERICAN: 52 mL/min — AB (ref >60)
GLUCOSE: 185 mg/dL — AB (ref 65–99)
POTASSIUM: 3.7 mmol/L (ref 3.5–5.1)
Sodium: 135 mmol/L (ref 135–145)

## 2017-12-11 LAB — TROPONIN I: Troponin I: <0.03 ng/mL (ref <0.03)

## 2017-12-11 NOTE — ED Notes (Signed)
Alert, NAD, calm, interactive, resps e/u, speaking in clear complete sentences, no dyspnea noted, skin W&D, VSS, c/o CP 3/10 L chest, also (subjective) sob and dizziness, LS CTA, (denies: pain [until you ask him to rate or describe it], NVD, fever, diaphoresis, syncope, or visual changes). Pt of Dr. Caryl Comes EP/cards. Family at [facility].

## 2017-12-11 NOTE — ED Triage Notes (Signed)
PResents with SOB with exertion and chest pain on and off for the past 4 days. Associated with bilateral hand numbness at times. Denies chest pain at this time. Pain was relieved with nitro.

## 2017-12-12 LAB — I-STAT TROPONIN, ED: Troponin i, poc: 0 ng/mL (ref 0.00–0.08)

## 2017-12-12 MED ORDER — ASPIRIN 81 MG PO CHEW
324.0000 mg | CHEWABLE_TABLET | Freq: Once | ORAL | Status: AC
  Administered 2017-12-12: 324 mg via ORAL
  Filled 2017-12-12: qty 4

## 2017-12-12 NOTE — ED Provider Notes (Signed)
[facility] Provider Note   CSN: [id number] Arrival date & time: 12/11/17  2148     History   Chief Complaint Chief Complaint  Patient presents with  . Chest Pain    HPI [name] [name] is a 72 y.o. male.  HPI  This is a 72 year old male with a history of coronary artery disease status post CABG with multiple subsequent stent placements and cardiac catheterization, CVA, diabetes, paroxysmal atrial fibrillation on Coumadin who presents with chest pain.  Patient reports 2-3-day history of worsening exertional chest pain.  He reports chest pain when he is out walking his dog or working around the house.  It improves with rest.  It lasts for minutes.  His last episode was at 8 PM.  Denies any recent fevers, cough, infectious symptoms.  Pain is similar to his prior episodes of angina.  Denies any leg swelling or history of blood clots.  He is currently pain-free.  Chart extensively reviewed.  Patient most recently saw cardiology in October.  At that time he was optimized on medical management for stable angina.  Last cardiac catheterization was in June 2016.  He had widely patent grafts at that time.  Past Medical History:  Diagnosis Date  . ACP (advance care planning) 05/11/2015  . Advance care planning 05/01/2015  . Anemia    hemoglobin 7.4, iron deficiency, January, 2011, 2 unit transfusion, endoscopy normal, capsule endoscopy February, 2011 no small bowel abnormalities.   Most likely source gastric erosions, followed by GI  . Anxiety   . CAD (coronary artery disease)    A. CABG in 2000,status post cardiac cath in 2006, 2009 ....continued chest pain and SOB despite oral medication adjestments including Ranexa. B. Cath November 2009/ mRCA - 2.75 x 23 Abbott Xience V drug-eluting stent ...11/26/2008 to distal  RCA leading to acute marginal.  C. Cath 07/2012 for CP - stable anatomy, med rx. d. cath 2015 and 05/30/2015 stable anatomy, consider Myoview if has CP  again  . Cerebral ischemia    MRI November, 2010, chronic microvascular ischemia  . Chronic diastolic CHF (congestive heart failure) ([place]) 03/13/2016  . Concussion   . Depression    Bipolar  . Diabetes mellitus type II, uncontrolled ([place])   . Diabetes mellitus without complication ([place])   . Diarrhea 02/15/2016  . Dizziness   . Edema   . Ejection fraction    EF 60%, echo, July 31, 2012  . Falling episodes    these have occurred in the past and again recurring 2011  . Family history of adverse reaction to anesthesia    "mother died during bypass surgery but not sure if it has to do with anesthesia"  . Gastric ulcer   . GERD (gastroesophageal reflux disease)   . H/O medication noncompliance    Due to loss of insurance  . Hard of hearing   . Headache 01/25/2016  . Hx of CABG    2000,  / one median sternotomy suture broken her chest x-ray November, 2010, no clinical significance  . Hyperlipidemia   . Hypertension    pt. denies  . Iron deficiency anemia   . Low back pain 06/12/2009   Qualifier: Diagnosis of  By: [name]   . Nausea with vomiting 01/25/2016  . Nephrolithiasis   . OSA (obstructive sleep apnea)   . Pain in limb 06/12/2009   Qualifier: Diagnosis of  By: [name]   . Palpitations  event recorder showed sinus rhythm  . RBBB (right bundle branch block)   . Restless leg   . Right knee pain 01/07/2015  . Shortness of breath    CPX April, 2011, mild functional limitation, no clear pulmonary or cardiac limitation, possible deconditioning and mild chronotropic incompetence( peak heart rate 130)  . Spondylosis    C5-6, C6-7 MRI 2010  . Syncope 03/2016  . Thyroid disease   . Tubulovillous adenoma of colon 2007  . Vitamin D deficiency 05/17/2017  . Wears glasses     Patient Active Problem List   Diagnosis Date Noted  . Hypersomnolence 10/18/2017  . Vitamin D deficiency 05/17/2017  . Seizure disorder ([place]) 01/09/2017  . Atrial fibrillation ([place]) [I48.91]  07/20/2016  . Long term (current) use of anticoagulants [Z79.01] 07/20/2016  . Foraminal stenosis of cervical region 05/31/2016  . Exertional dyspnea 05/14/2016  . Chronic diastolic CHF (congestive heart failure) ([place]) 03/13/2016  . Confusion 03/10/2016  . Headache 01/25/2016  . Carotid artery disease ([place]) 08/01/2015  . Spinal stenosis in cervical region 07/30/2015  . Occipital neuralgia of right side 07/15/2015  . Cervicogenic headache 07/15/2015  . Dizziness   . Chest pain 05/29/2015  . Medicare annual wellness visit, initial 05/11/2015  . Sun-damaged skin 05/11/2015  . Advance care planning 05/01/2015  . Concussion without loss of consciousness 02/18/2015  . Post-traumatic headache 02/18/2015  . Pain in both knees 01/07/2015  . Personal history of ongoing treatment with alendronate (Fosamax) 12/20/2013  . HLD (hyperlipidemia) 06/27/2013  . Arthritis of hand, degenerative 06/05/2013  . Acquired atrophy of thyroid 06/05/2013  . Gastro-esophageal reflux disease without esophagitis 06/05/2013  . Arteriosclerosis of coronary artery 05/01/2013  . Benign prostatic hyperplasia with urinary obstruction 05/01/2013  . Abdominal pain, unspecified site 03/06/2013  . Right shoulder pain 10/24/2012  . Eczema 10/24/2012  . Bilateral groin pain 09/21/2012  . Ejection fraction   . Diabetes mellitus type II, uncontrolled ([place]) 08/31/2011  . Coronary artery disease involving native coronary artery   . Anemia   . OSA (obstructive sleep apnea)   . Edema   . Essential hypertension   . Falling episodes   . Hx of CABG   . Palpitations   . Shortness of breath   . Insomnia 04/26/2011  . FATIGUE 12/10/2010  . Hypothyroidism 06/26/2010  . Depression 07/09/2009  . RBBB 07/09/2009  . Low back pain 06/12/2009  . SCIATICA 01/17/2009  . TUBULOVILLOUS ADENOMA, COLON, HX OF 01/08/2009  . Presence of drug coated stent in right coronary artery 10/29/2008  . NEPHROLITHIASIS, HX OF 12/20/2007     Past Surgical History:  Procedure Laterality Date  . ANTERIOR CERVICAL DECOMP/DISCECTOMY FUSION N/A 05/31/2016   Procedure: ANTERIOR CERVICAL DECOMPRESSION/DISCECTOMY FUSION CERVICAL FIVE-SIX,CERVICAL SIX-SEVEN;  Surgeon: [name], MD;  Location: [facility];  Service: Neurosurgery;  Laterality: N/A;  . CARDIAC CATHETERIZATION N/A 05/30/2015   Procedure: Left Heart Cath and Coronary Angiography;  Surgeon: [name], MD;  Location: [facility];  Service: Cardiovascular;  Laterality: N/A;  . COLONOSCOPY    . CORONARY ARTERY BYPASS GRAFT     2000  . ESOPHAGOGASTRODUODENOSCOPY    . LEFT HEART CATHETERIZATION WITH CORONARY/GRAFT ANGIOGRAM N/A 08/01/2012   Procedure: LEFT HEART CATHETERIZATION WITH Beatrix Fetters;  Surgeon: [name], MD;  Location: [facility] CATH LAB;  Service: Cardiovascular;  Laterality: N/A;  . LEFT HEART CATHETERIZATION WITH CORONARY/GRAFT ANGIOGRAM N/A 01/03/2015   Procedure: LEFT HEART CATHETERIZATION WITH Beatrix Fetters;  Surgeon: Pearletha Forge  Gwenlyn Found, MD;  Location: [facility] CATH LAB;  Service: Cardiovascular;  Laterality: N/A;  . NASAL SEPTUM SURGERY     UP3  . PERCUTANEOUS CORONARY STENT INTERVENTION (PCI-S)  10/2008   mRCA PCI  2.75 x 23 Abbott Xience V drug-eluting stent        Home Medications    Prior to Admission medications   Medication Sig Start Date End Date Taking? Authorizing Provider  albuterol (PROVENTIL HFA;VENTOLIN HFA) 108 (90 Base) MCG/ACT inhaler Inhale 2 puffs into the lungs every 6 (six) hours as needed for wheezing or shortness of breath. 01/04/17   [name], MD  amiodarone (PACERONE) 400 MG tablet Take 200 mg by mouth daily.    [provider]  atorvastatin (LIPITOR) 40 MG tablet TAKE 1 & 1/2 TABLETS (60 MG TOTAL) BY MOUTH DAILY. 11/16/17   [name], MD  blood glucose meter kit and supplies KIT Dispense based on patient and insurance preference. Use up to four times daily as directed. (FOR ICD-9  250.00, 250.01). LIBRE 10/07/17   [name], MD  diclofenac sodium (VOLTAREN) 1 % GEL Apply 2 g topically daily as needed (for pain in affected area).    [provider]  divalproex (DEPAKOTE ER) 500 MG 24 hr tablet TAKE 1 TABLET (500 MG TOTAL) BY MOUTH DAILY. 11/07/17   Pieter Partridge, DO  gabapentin (NEURONTIN) 300 MG capsule Take 2 capsules (600 mg) by mouth  every morning, 1 capsule (300 mg) by mouth  at noon, and 2 capsules (600 mg) by mouth at night    [provider]  glucose blood (ACCU-CHEK AVIVA) test strip Use as directed to check blood sugar 4 times daily.  DX E11.9 10/10/17   [name], MD  insulin aspart (NOVOLOG) 100 UNIT/ML injection Inject 45 Units into the skin 3 (three) times daily with meals. 11/22/17   Renato Shin, MD  Insulin Glargine (TOUJEO SOLOSTAR) 300 UNIT/ML SOPN Inject 40 Units into the skin at bedtime. 11/21/17   Renato Shin, MD  Insulin Pen Needle 31G X 5 MM MISC UAD with insulin for DM 07/28/17   [name], MD  isosorbide mononitrate (IMDUR) 60 MG 24 hr tablet TAKE TWO TABLETS BY MOUTH DAILY 11/16/17   [name], MD  levothyroxine (SYNTHROID, LEVOTHROID) 88 MCG tablet TAKE 1 TABLET (88 MCG TOTAL) BY MOUTH DAILY BEFORE BREAKFAST. 11/07/17   [name], MD  loperamide (IMODIUM A-D) 2 MG tablet Take 1 tablet (2 mg total) by mouth 4 (four) times daily as needed for diarrhea or loose stools. 02/13/17   Fredia Sorrow, MD  meclizine (ANTIVERT) 12.5 MG tablet Take 1 tablet (12.5 mg total) by mouth 3 (three) times daily as needed for dizziness. Patient not taking: Reported on 11/21/2017 09/24/16   Mesner, [name], MD  metoprolol succinate (TOPROL-XL) 50 MG 24 hr tablet TAKE 1&1/2 TABLETS BY MOUTH ONCE A DAY FOR A TOTAL OF 75 MG. 07/06/17   Simmons, Brittainy M, PA-C  midodrine (PROAMATINE) 10 MG tablet TAKE 1 TABLET (10 MG TOTAL) BY MOUTH 3 TIMES DAILY. 05/10/17   [name], MD  nitroGLYCERIN (NITROSTAT) 0.4 MG SL tablet Place  1 tablet (0.4 mg total) under the tongue every 5 (five) minutes as needed for chest pain. 07/28/17   [name], MD  ondansetron (ZOFRAN ODT) 4 MG disintegrating tablet Take 1 tablet (4 mg total) by mouth every 8 (eight) hours as needed for nausea or vomiting. 02/13/17   Rogene Houston,  Scott, MD  pantoprazole (PROTONIX) 40 MG tablet TAKE 1 TABLET BY MOUTH TWICE DAILY FOR 2 WEEKS THEN DROP TO ONCE DAILY 12/09/17   [name], MD  pramipexole (MIRAPEX) 1 MG tablet TAKE 1/2 TO 1 TABLET BY MOUTH AT BEDTIME 12/09/17   [name], MD  ranolazine (RANEXA) 500 MG 12 hr tablet Take 1 tablet (500 mg total) by mouth 2 (two) times daily. 10/03/17   Lyda Jester M, PA-C  sertraline (ZOLOFT) 100 MG tablet TAKE 1 TABLET (100 MG TOTAL) BY MOUTH 2 (TWO) TIMES DAILY. 05/09/17   [name], MD  tiZANidine (ZANAFLEX) 4 MG tablet Take 1 tablet (4 mg total) by mouth every 8 (eight) hours as needed for muscle spasms. Patient not taking: Reported on 11/21/2017 11/19/15   Pieter Partridge, DO  topiramate (TOPAMAX) 50 MG tablet TAKE 1 TABLET (50 MG) BY MOUTH AT BEDTIME. 08/02/16   [name], Adam R, DO  Vitamin D, Ergocalciferol, (DRISDOL) 50000 units CAPS capsule TAKE 1 CAPSULE (50,000 UNITS TOTAL) BY MOUTH EVERY 7 (SEVEN) DAYS. 12/09/17   [name], MD  warfarin (COUMADIN) 5 MG tablet TAKE AS DIRECTED BY COUMADIN CLINIC 07/28/17   [name], MD    Family History Family History  Problem Relation Age of Onset  . Pancreatic cancer Brother   . Diabetes Brother   . Coronary artery disease Brother   . Stroke Brother   . Diabetes Brother   . Diabetes Mother   . Heart failure Mother   . Heart failure Father   . Hypothyroidism Brother   . Coronary artery disease Brother   . Other Brother        colon surgery  . Heart attack Unknown        Nephew  . Irregular heart beat Daughter   . Cancer Maternal Grandmother        unknown     Social History Social History   Tobacco Use  . Smoking status:  Never Smoker  . Smokeless tobacco: Never Used  Substance Use Topics  . Alcohol use: No    Alcohol/week: 0.0 oz    Comment: stopped drinking in 1998  . Drug use: No     Allergies   Morphine and Shellfish-derived products   Review of Systems Review of Systems  Constitutional: Negative for fever.  Respiratory: Positive for shortness of breath.   Cardiovascular: Positive for chest pain. Negative for leg swelling.  Gastrointestinal: Negative for abdominal pain, nausea and vomiting.  Genitourinary: Negative for dysuria.  All other systems reviewed and are negative.    Physical Exam Updated Vital Signs BP 128/61   Pulse (!) 47   Temp 98.4 F (36.9 C) (Oral)   Resp 19   SpO2 96%   Physical Exam  Constitutional: He is oriented to person, place, and time. He appears well-developed and well-nourished.  HENT:  Head: Normocephalic and atraumatic.  Eyes: Pupils are equal, round, and reactive to light.  Cardiovascular: Normal rate, regular rhythm and normal pulses.  Murmur heard. Well-healed midline sternotomy scar  Pulmonary/Chest: Effort normal and breath sounds normal. No respiratory distress. He has no wheezes.  Abdominal: Soft. Bowel sounds are normal. There is no tenderness. There is no rebound.  Musculoskeletal: He exhibits no edema.  Lymphadenopathy:    He has no cervical adenopathy.  Neurological: He is alert and oriented to person, place, and time.  Skin: Skin is warm and dry.  Psychiatric: He has a normal mood and affect.  Nursing note and vitals reviewed.    ED Treatments / Results  Labs (all labs ordered are listed, but only abnormal results are displayed) Labs Reviewed  BASIC METABOLIC PANEL - Abnormal; Notable for the following components:      Result Value   Glucose, Bld 185 (*)    Creatinine, Ser 1.36 (*)    GFR calc non Af Amer 52 (*)    GFR calc Af Amer 60 (*)    All other components within normal limits  CBC - Abnormal; Notable for the following  components:   Hemoglobin 12.9 (*)    HCT 36.1 (*)    All other components within normal limits  TROPONIN I  TROPONIN I    EKG  EKG Interpretation  Date/Time:  Sunday December 11 2017 21:50:44 EST Ventricular Rate:  54 PR Interval:  198 QRS Duration: 120 QT Interval:  464 QTC Calculation: 440 R Axis:   -10 Text Interpretation:  Sinus bradycardia Right bundle branch block Abnormal ECG No significant change since last tracing Confirmed by Thayer Jew [phone]) on 12/11/2017 11:27:14 PM       Radiology Dg Chest 2 View  Result Date: 12/11/2017 CLINICAL DATA:  Acute onset worsening chest pain. EXAM: CHEST  2 VIEW COMPARISON:  Cervical spine radiographs 06/24/2017, CXR 01/02/2017 FINDINGS: Status post CABG. Normal heart size and mediastinal contours. Both lungs are clear. ACDF from C5 through C7. IMPRESSION: No active cardiopulmonary disease.  Post CABG change. Electronically Signed   By: Ashley Royalty [name]   On: 12/11/2017 23:16    Procedures Procedures (including critical care time)  Medications Ordered in ED Medications  aspirin chewable tablet 324 mg (324 mg Oral Given 12/12/17 0041)     Initial Impression / Assessment and Plan / ED Course  I have reviewed the triage vital signs and the nursing notes.  Pertinent labs & imaging results that were available during my care of the patient were reviewed by me and considered in my medical decision making (see chart for details).  Clinical Course as of Dec 12 444  Mon Dec 12, 2017  0121 Discussed with on-call cardiology.  Chart extensively reviewed.  Patient is maximized on medical therapy for stable angina.  Recommend repeat troponin.  She had discussion and decision making with patient regarding close outpatient cardiology follow-up.  [CH]    Clinical Course User Index [CH] Horton, Barbette Hair, MD    Patient presents with chest pain.  He is currently chest pain-free.  He has an extensive history but also has a history of standing  stable angina.  Last seen by cardiology in October.  EKG is unchanged.  Initial troponin negative.  Chest x-ray reassuring.  Patient has remained chest pain-free in the emergency room.  He was given a full dose aspirin.  I discussed the patient with on-call cardiology, Dr. Emilio Aspen.  He and I both extensively reviewed the chart.  Patient is currently maxed out on medical therapy for angina.  Cardiology recommends repeat troponin.  If negative and patient is comfortable plan, recommend close outpatient follow-up with cardiologist for further discussions regarding invasive treatment.  I discussed this at length with the patient and his daughter.  They are comfortable with this plan.  Repeat troponin is negative and the patient has remained chest pain-free while in the emergency room.  After history, exam, and medical workup I feel the patient has been appropriately medically screened and is safe for discharge home. Pertinent diagnoses were discussed with the patient. Patient  was given return precautions.   Final Clinical Impressions(s) / ED Diagnoses   Final diagnoses:  Stable angina [facility])    ED Discharge Orders    None       Dina Rich, Barbette Hair, MD 12/12/17 [phone]

## 2017-12-12 NOTE — ED Notes (Signed)
EDP into room at time of d/c. Denies questions or needs. VSS. Out in w/c by RN to d/c desk and vehicle.   Alert, NAD, calm, interactive, resps e/u, speaking in clear complete sentences, no dyspnea noted, skin W&D.

## 2017-12-12 NOTE — ED Notes (Signed)
Sleeping, arousable to voice, updated on delay, no changes, alert, NAD, calm, interactive, family at [facility]. VSS.

## 2017-12-12 NOTE — ED Notes (Signed)
Resting comfortably, sleeping, NAD, calm, updated, family at [facility].

## 2017-12-12 NOTE — Progress Notes (Signed)
Cardiology Office Note    Date:  12/13/2017   ID:  [name] [name], DOB Oct 03, 1946, MRN [id number]  PCP:  [name], MD  Cardiologist: [name]  Chief Complaint  Patient presents with  . Chest Pain    History of Present Illness:  [name] [name] is a 72 y.o. male with history of CAD status post CABG in 2000, status post DES to the RCA in 2009.  Most recent cath 2016 patent grafts and patent stent.  Has had angina over the past couple years felt to be microvascular disease versus noncardiac.  Also has PAF on Coumadin and amiodarone, HLD, DM that has been uncontrolled with hemoglobin A1c levels 9-10.  Also history of orthostatic hypotension on midodrine.  Patient was seen by Ellen Henri, PA-C 09/2017 and was having exertional angina that improved with rest and sublingual nitroglycerin.  No prolonged symptoms.  Because of his orthostatic hypotension could not titrate his Imdur beta-blocker.  Ranexa 500 mg twice daily was added.  Patient was in the ER yesterday with exertional chest pain.  Troponins negative.  Other labs stable.  Creatinine 1.36.  Patient complains of 4-5 day history of worsening dyspnea on exertion and chest tightness beginning into abdomen around to back and down left arm. Pain eases with rest and NTG 1-2.  Patient says he cannot do anything.  He also complains of occasional dizziness, confusion and chills.  No fever or signs of infection.  He can afford the better insulin that he needs and his last hemoglobin A1c was 11.  He is doing the best he can on a fixed income.    Past Medical History:  Diagnosis Date  . ACP (advance care planning) 05/11/2015  . Advance care planning 05/01/2015  . Anemia    hemoglobin 7.4, iron deficiency, January, 2011, 2 unit transfusion, endoscopy normal, capsule endoscopy February, 2011 no small bowel abnormalities.   Most likely source gastric erosions, followed by GI  . Anxiety   . CAD (coronary artery disease)    A. CABG  in 2000,status post cardiac cath in 2006, 2009 ....continued chest pain and SOB despite oral medication adjestments including Ranexa. B. Cath November 2009/ mRCA - 2.75 x 23 Abbott Xience V drug-eluting stent ...11/26/2008 to distal  RCA leading to acute marginal.  C. Cath 07/2012 for CP - stable anatomy, med rx. d. cath 2015 and 05/30/2015 stable anatomy, consider Myoview if has CP again  . Cerebral ischemia    MRI November, 2010, chronic microvascular ischemia  . Chronic diastolic CHF (congestive heart failure) ([place]) 03/13/2016  . Concussion   . Depression    Bipolar  . Diabetes mellitus type II, uncontrolled ([place])   . Diabetes mellitus without complication ([place])   . Diarrhea 02/15/2016  . Dizziness   . Edema   . Ejection fraction    EF 60%, echo, July 31, 2012  . Falling episodes    these have occurred in the past and again recurring 2011  . Family history of adverse reaction to anesthesia    "mother died during bypass surgery but not sure if it has to do with anesthesia"  . Gastric ulcer   . GERD (gastroesophageal reflux disease)   . H/O medication noncompliance    Due to loss of insurance  . Hard of hearing   . Headache 01/25/2016  . Hx of CABG    2000,  / one median sternotomy suture broken her chest x-ray November, 2010, no clinical significance  .  Hyperlipidemia   . Hypertension    pt. denies  . Iron deficiency anemia   . Low back pain 06/12/2009   Qualifier: Diagnosis of  By: [name]   . Nausea with vomiting 01/25/2016  . Nephrolithiasis   . OSA (obstructive sleep apnea)   . Pain in limb 06/12/2009   Qualifier: Diagnosis of  By: [name]   . Palpitations    event recorder showed sinus rhythm  . RBBB (right bundle branch block)   . Restless leg   . Right knee pain 01/07/2015  . Shortness of breath    CPX April, 2011, mild functional limitation, no clear pulmonary or cardiac limitation, possible deconditioning and mild chronotropic incompetence( peak heart  rate 130)  . Spondylosis    C5-6, C6-7 MRI 2010  . Syncope 03/2016  . Thyroid disease   . Tubulovillous adenoma of colon 2007  . Vitamin D deficiency 05/17/2017  . Wears glasses     Past Surgical History:  Procedure Laterality Date  . ANTERIOR CERVICAL DECOMP/DISCECTOMY FUSION N/A 05/31/2016   Procedure: ANTERIOR CERVICAL DECOMPRESSION/DISCECTOMY FUSION CERVICAL FIVE-SIX,CERVICAL SIX-SEVEN;  Surgeon: [name], MD;  Location: [facility];  Service: Neurosurgery;  Laterality: N/A;  . CARDIAC CATHETERIZATION N/A 05/30/2015   Procedure: Left Heart Cath and Coronary Angiography;  Surgeon: [name], MD;  Location: [facility];  Service: Cardiovascular;  Laterality: N/A;  . COLONOSCOPY    . CORONARY ARTERY BYPASS GRAFT     2000  . ESOPHAGOGASTRODUODENOSCOPY    . LEFT HEART CATHETERIZATION WITH CORONARY/GRAFT ANGIOGRAM N/A 08/01/2012   Procedure: LEFT HEART CATHETERIZATION WITH Beatrix Fetters;  Surgeon: [name], MD;  Location: [facility] CATH LAB;  Service: Cardiovascular;  Laterality: N/A;  . LEFT HEART CATHETERIZATION WITH CORONARY/GRAFT ANGIOGRAM N/A 01/03/2015   Procedure: LEFT HEART CATHETERIZATION WITH Beatrix Fetters;  Surgeon: [name], MD;  Location: [facility] C[facility] LAB;  Service: Cardiovascular;  Laterality: N/A;  . NASAL SEPTUM SURGERY     UP3  . PERCUTANEOUS CORONARY STENT INTERVENTION (PCI-S)  10/2008   mRCA PCI  2.75 x 23 Abbott Xience V drug-eluting stent     Current Medications: Current Meds  Medication Sig  . albuterol (PROVENTIL HFA;VENTOLIN HFA) 108 (90 Base) MCG/ACT inhaler Inhale 2 puffs into the lungs every 6 (six) hours as needed for wheezing or shortness of breath.  Marland Kitchen amiodarone (PACERONE) 400 MG tablet Take 200 mg by mouth daily.  Marland Kitchen atorvastatin (LIPITOR) 40 MG tablet TAKE 1 & 1/2 TABLETS (60 MG TOTAL) BY MOUTH DAILY.  . blood glucose meter kit and supplies KIT Dispense based on patient and insurance preference. Use up to four times  daily as directed. (FOR ICD-9 250.00, 250.01). LIBRE  . diclofenac sodium (VOLTAREN) 1 % GEL Apply 2 g topically daily as needed (for pain in affected area).  . divalproex (DEPAKOTE ER) 500 MG 24 hr tablet TAKE 1 TABLET (500 MG TOTAL) BY MOUTH DAILY.  Marland Kitchen gabapentin (NEURONTIN) 300 MG capsule Take 2 capsules (600 mg) by mouth  every morning, 1 capsule (300 mg) by mouth  at noon, and 2 capsules (600 mg) by mouth at night  . glucose blood (ACCU-CHEK AVIVA) test strip Use as directed to check blood sugar 4 times daily.  DX E11.9  . insulin aspart (NOVOLOG) 100 UNIT/ML injection Inject 45 Units into the skin 3 (three) times daily with meals.  . Insulin Glargine (TOUJEO SOLOSTAR) 300 UNIT/ML SOPN Inject 40 Units into the skin  at bedtime.  . Insulin Pen Needle 31G X 5 MM MISC UAD with insulin for DM  . isosorbide mononitrate (IMDUR) 60 MG 24 hr tablet TAKE TWO TABLETS BY MOUTH DAILY  . levothyroxine (SYNTHROID, LEVOTHROID) 88 MCG tablet TAKE 1 TABLET (88 MCG TOTAL) BY MOUTH DAILY BEFORE BREAKFAST.  Marland Kitchen loperamide (IMODIUM A-D) 2 MG tablet Take 1 tablet (2 mg total) by mouth 4 (four) times daily as needed for diarrhea or loose stools.  . meclizine (ANTIVERT) 12.5 MG tablet Take 1 tablet (12.5 mg total) by mouth 3 (three) times daily as needed for dizziness.  . metoprolol succinate (TOPROL-XL) 50 MG 24 hr tablet TAKE 1&1/2 TABLETS BY MOUTH ONCE A DAY FOR A TOTAL OF 75 MG.  . midodrine (PROAMATINE) 10 MG tablet TAKE 1 TABLET (10 MG TOTAL) BY MOUTH 3 TIMES DAILY.  . nitroGLYCERIN (NITROSTAT) 0.4 MG SL tablet Place 1 tablet (0.4 mg total) under the tongue every 5 (five) minutes as needed for chest pain.  Marland Kitchen ondansetron (ZOFRAN ODT) 4 MG disintegrating tablet Take 1 tablet (4 mg total) by mouth every 8 (eight) hours as needed for nausea or vomiting.  . pantoprazole (PROTONIX) 40 MG tablet TAKE 1 TABLET BY MOUTH TWICE DAILY FOR 2 WEEKS THEN DROP TO ONCE DAILY  . pramipexole (MIRAPEX) 1 MG tablet TAKE 1/2 TO 1 TABLET  BY MOUTH AT BEDTIME  . ranolazine (RANEXA) 500 MG 12 hr tablet Take 1 tablet (500 mg total) by mouth 2 (two) times daily.  . sertraline (ZOLOFT) 100 MG tablet TAKE 1 TABLET (100 MG TOTAL) BY MOUTH 2 (TWO) TIMES DAILY.  Marland Kitchen tiZANidine (ZANAFLEX) 4 MG tablet Take 1 tablet (4 mg total) by mouth every 8 (eight) hours as needed for muscle spasms.  Marland Kitchen topiramate (TOPAMAX) 50 MG tablet TAKE 1 TABLET (50 MG) BY MOUTH AT BEDTIME.  Marland Kitchen Vitamin D, Ergocalciferol, (DRISDOL) 50000 units CAPS capsule TAKE 1 CAPSULE (50,000 UNITS TOTAL) BY MOUTH EVERY 7 (SEVEN) DAYS.  Marland Kitchen warfarin (COUMADIN) 5 MG tablet TAKE AS DIRECTED BY COUMADIN CLINIC     Allergies:   Morphine and Shellfish-derived products   Social History   Socioeconomic History  . Marital status: Married    Spouse name: None  . Number of children: 4  . Years of education: 58  . Highest education level: None  Social Needs  . Financial resource strain: None  . Food insecurity - worry: None  . Food insecurity - inability: None  . Transportation needs - medical: None  . Transportation needs - non-medical: None  Occupational History  . Occupation: retired  Tobacco Use  . Smoking status: Never Smoker  . Smokeless tobacco: Never Used  Substance and Sexual Activity  . Alcohol use: No    Alcohol/week: 0.0 oz    Comment: stopped drinking in 1998  . Drug use: No  . Sexual activity: None  Other Topics Concern  . None  Social History Narrative   Patient is right handed.   Patient does not drink caffeine.     Family History:  The patient's family history includes Cancer in his maternal grandmother; Coronary artery disease in his brother and brother; Diabetes in his brother, brother, and mother; Heart attack in his unknown relative; Heart failure in his father and mother; Hypothyroidism in his brother; Irregular heart beat in his daughter; Other in his brother; Pancreatic cancer in his brother; Stroke in his brother.   ROS:   Please see the history  of present illness.  Review of Systems  Constitution: Negative.  HENT: Negative.   Cardiovascular: Positive for chest pain, dyspnea on exertion and irregular heartbeat.  Respiratory: Negative.   Endocrine: Negative.   Hematologic/Lymphatic: Negative.   Musculoskeletal: Negative.   Gastrointestinal: Negative.   Genitourinary: Negative.   Neurological: Negative.    All other systems reviewed and are negative.   PHYSICAL EXAM:   VS:  BP 124/62   Pulse 61   Ht _0  (1.753 m)   Wt 175 lb 12.8 oz (79.7 kg)   BMI 25.96 kg/m   Physical Exam  GEN: Well nourished, well developed, in no acute distress Neck: no JVD, carotid bruits, or masses Cardiac:RRR; positive S4, 1/6 systolic murmur at the left sternal border Respiratory:  clear to auscultation bilaterally, normal work of breathing GI: soft, nontender, nondistended, + BS Ext: without cyanosis, clubbing, or edema, Good distal pulses bilaterally MS: no deformity or atrophy  Skin: warm and dry, no rash Neuro:  Alert and Oriented x 3, Strength and sensation are intact Psych: euthymic mood, full affect  Wt Readings from Last 3 Encounters:  12/13/17 175 lb 12.8 oz (79.7 kg)  11/21/17 172 lb (78 kg)  10/18/17 171 lb (77.6 kg)      Studies/Labs Reviewed:   EKG:  EKG is not  ordered today.  The ekg reviewed from the emergency room 12/11/17 showed normal sinus rhythm with right bundle branch block, no acute change.  Recent Labs: 10/18/2017: ALT 10; TSH 1.96 12/11/2017: BUN 17; Creatinine, Ser 1.36; Hemoglobin 12.9; Platelets 206; Potassium 3.7; Sodium 135   Lipid Panel    Component Value Date/Time   CHOL 121 10/18/2017 1210   CHOL 99 (L) 09/27/2017 0823   TRIG 284.0 (H) 10/18/2017 1210   HDL 30.20 (L) 10/18/2017 1210   HDL 30 (L) 09/27/2017 0823   CHOLHDL 4 10/18/2017 1210   VLDL 56.8 (H) 10/18/2017 1210   LDLCALC 35 09/27/2017 0823   LDLDIRECT 63.0 10/18/2017 1210    Additional studies/ records that were reviewed today  include:    Cardiac catheterization 6/16: 1. Prox LAD to Mid LAD lesion, 90% stenosed. Mid LAD to Dist LAD lesion, 100% stenosed within a bare metal stent (from Pre-CABG). 2. Ost 2nd Diag to 2nd Diag lesion, 100% stenosed within a BMS (pre-CABG) 3. Post Atrio lesion, 100% stenosed. 4. Widely patent DES stent to the mid RCA just prior to the bifurcation into RPDA and RPAV 5. Widely patent LIMA to a Small downstream distal LAD - minimal CAD 6. Widely patent saphenous vein grafts to the D1, D2 and RPAV 7. The left ventricular systolic function is normal. 8. No new lesion to explain unstable angina    Echo 4/5/17Study Conclusions   - Left ventricle: The cavity size was normal. There was mild   concentric hypertrophy. Systolic function was normal. The   estimated ejection fraction was in the range of 55% to 60%. Wall   motion was normal; there were no regional wall motion   abnormalities. There was an increased relative contribution of   atrial contraction to ventricular filling. Doppler parameters are   consistent with abnormal left ventricular relaxation (grade 1   diastolic dysfunction). - Mitral valve: There was mild regurgitation. - Left atrium: The atrium was mildly dilated.      ASSESSMENT:    1. Coronary artery disease involving native coronary artery of native heart with other form of angina pectoris ([place])   2. Essential hypertension   3. Chronic diastolic CHF (congestive heart  failure) ([place])   4. Atrial fibrillation, unspecified type ([place])      PLAN:  In order of problems listed above:  Chest pain gradually worsened over the past several months but significant change in the past 4-5 days occurring with minimal exertion and relieved with nitroglycerin.  Discussed with [name] who recommends he undergo cardiac catheterization.  Last creatinine was 1.36.  He is on Coumadin.  INR today is 2.2.  Patient is  agreeable to cardiac catheterization.  We will schedule for this  Thursday with [name].  Hold Coumadin today and tomorrow.  He will need an INR checked Thursday morning in the Cath Lab.  Check other labs today.  I have reviewed the risks, indications, and alternatives to angioplasty and stenting with the patient. Risks include but are not limited to bleeding, infection, vascular injury, stroke, myocardial infection, arrhythmia, kidney injury, radiation-related injury in the case of prolonged fluoroscopy use, emergency cardiac surgery, and death. The patient understands the risks of serious complication is low (<5%) and he agrees to proceed.     CAD status post CABG in 2000, DES to the RCA in 2009, patent grafts and stent in 2016.  Chronic exertional angina managed with sublingual nitroglycerin.  In the ER yesterday with chest pain troponins negative and EKG unchanged. Scheduled for cath Thursday.  Essential hypertension blood pressure controlled.  He has had increased dizziness and is on Midrin.  He says this has not been a problem in a while.  Chronic diastolic CHF normal LVEF grade 1 DD on echo 03/2016  Atrial fibrillation on Coumadin and amiodarone.      Medication Adjustments/Labs and Tests Ordered: Current medicines are reviewed at length with the patient today.  Concerns regarding medicines are outlined above.  Medication changes, Labs and Tests ordered today are listed in the Patient Instructions below. There are no Patient Instructions on file for this visit.   Sumner Boast, PA-C  12/13/2017 9:00 AM    [facility] [place], [place], [place]  [address] Phone: [phone]; Fax: [phone]

## 2017-12-12 NOTE — H&P (View-Only) (Signed)
Cardiology Office Note    Date:  12/13/2017   ID:  [name] [name], DOB Oct 03, 1946, MRN [id number]  PCP:  [name], MD  Cardiologist: [name]  Chief Complaint  Patient presents with  . Chest Pain    History of Present Illness:  [name] [name] is a 72 y.o. male with history of CAD status post CABG in 2000, status post DES to the RCA in 2009.  Most recent cath 2016 patent grafts and patent stent.  Has had angina over the past couple years felt to be microvascular disease versus noncardiac.  Also has PAF on Coumadin and amiodarone, HLD, DM that has been uncontrolled with hemoglobin A1c levels 9-10.  Also history of orthostatic hypotension on midodrine.  Patient was seen by Ellen Henri, PA-C 09/2017 and was having exertional angina that improved with rest and sublingual nitroglycerin.  No prolonged symptoms.  Because of his orthostatic hypotension could not titrate his Imdur beta-blocker.  Ranexa 500 mg twice daily was added.  Patient was in the ER yesterday with exertional chest pain.  Troponins negative.  Other labs stable.  Creatinine 1.36.  Patient complains of 4-5 day history of worsening dyspnea on exertion and chest tightness beginning into abdomen around to back and down left arm. Pain eases with rest and NTG 1-2.  Patient says he cannot do anything.  He also complains of occasional dizziness, confusion and chills.  No fever or signs of infection.  He can afford the better insulin that he needs and his last hemoglobin A1c was 11.  He is doing the best he can on a fixed income.    Past Medical History:  Diagnosis Date  . ACP (advance care planning) 05/11/2015  . Advance care planning 05/01/2015  . Anemia    hemoglobin 7.4, iron deficiency, January, 2011, 2 unit transfusion, endoscopy normal, capsule endoscopy February, 2011 no small bowel abnormalities.   Most likely source gastric erosions, followed by GI  . Anxiety   . CAD (coronary artery disease)    A. CABG  in 2000,status post cardiac cath in 2006, 2009 ....continued chest pain and SOB despite oral medication adjestments including Ranexa. B. Cath November 2009/ mRCA - 2.75 x 23 Abbott Xience V drug-eluting stent ...11/26/2008 to distal  RCA leading to acute marginal.  C. Cath 07/2012 for CP - stable anatomy, med rx. d. cath 2015 and 05/30/2015 stable anatomy, consider Myoview if has CP again  . Cerebral ischemia    MRI November, 2010, chronic microvascular ischemia  . Chronic diastolic CHF (congestive heart failure) ([place]) 03/13/2016  . Concussion   . Depression    Bipolar  . Diabetes mellitus type II, uncontrolled ([place])   . Diabetes mellitus without complication ([place])   . Diarrhea 02/15/2016  . Dizziness   . Edema   . Ejection fraction    EF 60%, echo, July 31, 2012  . Falling episodes    these have occurred in the past and again recurring 2011  . Family history of adverse reaction to anesthesia    "mother died during bypass surgery but not sure if it has to do with anesthesia"  . Gastric ulcer   . GERD (gastroesophageal reflux disease)   . H/O medication noncompliance    Due to loss of insurance  . Hard of hearing   . Headache 01/25/2016  . Hx of CABG    2000,  / one median sternotomy suture broken her chest x-ray November, 2010, no clinical significance  .  Hyperlipidemia   . Hypertension    pt. denies  . Iron deficiency anemia   . Low back pain 06/12/2009   Qualifier: Diagnosis of  By: [name]   . Nausea with vomiting 01/25/2016  . Nephrolithiasis   . OSA (obstructive sleep apnea)   . Pain in limb 06/12/2009   Qualifier: Diagnosis of  By: [name]   . Palpitations    event recorder showed sinus rhythm  . RBBB (right bundle branch block)   . Restless leg   . Right knee pain 01/07/2015  . Shortness of breath    CPX April, 2011, mild functional limitation, no clear pulmonary or cardiac limitation, possible deconditioning and mild chronotropic incompetence( peak heart  rate 130)  . Spondylosis    C5-6, C6-7 MRI 2010  . Syncope 03/2016  . Thyroid disease   . Tubulovillous adenoma of colon 2007  . Vitamin D deficiency 05/17/2017  . Wears glasses     Past Surgical History:  Procedure Laterality Date  . ANTERIOR CERVICAL DECOMP/DISCECTOMY FUSION N/A 05/31/2016   Procedure: ANTERIOR CERVICAL DECOMPRESSION/DISCECTOMY FUSION CERVICAL FIVE-SIX,CERVICAL SIX-SEVEN;  Surgeon: [name], MD;  Location: [facility];  Service: Neurosurgery;  Laterality: N/A;  . CARDIAC CATHETERIZATION N/A 05/30/2015   Procedure: Left Heart Cath and Coronary Angiography;  Surgeon: [name], MD;  Location: [facility] LAB;  Service: Cardiovascular;  Laterality: N/A;  . COLONOSCOPY    . CORONARY ARTERY BYPASS GRAFT     2000  . ESOPHAGOGASTRODUODENOSCOPY    . LEFT HEART CATHETERIZATION WITH CORONARY/GRAFT ANGIOGRAM N/A 08/01/2012   Procedure: LEFT HEART CATHETERIZATION WITH Beatrix Fetters;  Surgeon: [name], MD;  Location: [facility] CATH LAB;  Service: Cardiovascular;  Laterality: N/A;  . LEFT HEART CATHETERIZATION WITH CORONARY/GRAFT ANGIOGRAM N/A 01/03/2015   Procedure: LEFT HEART CATHETERIZATION WITH Beatrix Fetters;  Surgeon: [name], MD;  Location: [facility] CATH LAB;  Service: Cardiovascular;  Laterality: N/A;  . NASAL SEPTUM SURGERY     UP3  . PERCUTANEOUS CORONARY STENT INTERVENTION (PCI-S)  10/2008   mRCA PCI  2.75 x 23 Abbott Xience V drug-eluting stent     Current Medications: Current Meds  Medication Sig  . albuterol (PROVENTIL HFA;VENTOLIN HFA) 108 (90 Base) MCG/ACT inhaler Inhale 2 puffs into the lungs every 6 (six) hours as needed for wheezing or shortness of breath.  Marland Kitchen amiodarone (PACERONE) 400 MG tablet Take 200 mg by mouth daily.  Marland Kitchen atorvastatin (LIPITOR) 40 MG tablet TAKE 1 & 1/2 TABLETS (60 MG TOTAL) BY MOUTH DAILY.  . blood glucose meter kit and supplies KIT Dispense based on patient and insurance preference. Use up to four times  daily as directed. (FOR ICD-9 250.00, 250.01). LIBRE  . diclofenac sodium (VOLTAREN) 1 % GEL Apply 2 g topically daily as needed (for pain in affected area).  . divalproex (DEPAKOTE ER) 500 MG 24 hr tablet TAKE 1 TABLET (500 MG TOTAL) BY MOUTH DAILY.  Marland Kitchen gabapentin (NEURONTIN) 300 MG capsule Take 2 capsules (600 mg) by mouth  every morning, 1 capsule (300 mg) by mouth  at noon, and 2 capsules (600 mg) by mouth at night  . glucose blood (ACCU-CHEK AVIVA) test strip Use as directed to check blood sugar 4 times daily.  DX E11.9  . insulin aspart (NOVOLOG) 100 UNIT/ML injection Inject 45 Units into the skin 3 (three) times daily with meals.  . Insulin Glargine (TOUJEO SOLOSTAR) 300 UNIT/ML SOPN Inject 40 Units into the skin  at bedtime.  . Insulin Pen Needle 31G X 5 MM MISC UAD with insulin for DM  . isosorbide mononitrate (IMDUR) 60 MG 24 hr tablet TAKE TWO TABLETS BY MOUTH DAILY  . levothyroxine (SYNTHROID, LEVOTHROID) 88 MCG tablet TAKE 1 TABLET (88 MCG TOTAL) BY MOUTH DAILY BEFORE BREAKFAST.  Marland Kitchen loperamide (IMODIUM A-D) 2 MG tablet Take 1 tablet (2 mg total) by mouth 4 (four) times daily as needed for diarrhea or loose stools.  . meclizine (ANTIVERT) 12.5 MG tablet Take 1 tablet (12.5 mg total) by mouth 3 (three) times daily as needed for dizziness.  . metoprolol succinate (TOPROL-XL) 50 MG 24 hr tablet TAKE 1&1/2 TABLETS BY MOUTH ONCE A DAY FOR A TOTAL OF 75 MG.  . midodrine (PROAMATINE) 10 MG tablet TAKE 1 TABLET (10 MG TOTAL) BY MOUTH 3 TIMES DAILY.  . nitroGLYCERIN (NITROSTAT) 0.4 MG SL tablet Place 1 tablet (0.4 mg total) under the tongue every 5 (five) minutes as needed for chest pain.  Marland Kitchen ondansetron (ZOFRAN ODT) 4 MG disintegrating tablet Take 1 tablet (4 mg total) by mouth every 8 (eight) hours as needed for nausea or vomiting.  . pantoprazole (PROTONIX) 40 MG tablet TAKE 1 TABLET BY MOUTH TWICE DAILY FOR 2 WEEKS THEN DROP TO ONCE DAILY  . pramipexole (MIRAPEX) 1 MG tablet TAKE 1/2 TO 1 TABLET  BY MOUTH AT BEDTIME  . ranolazine (RANEXA) 500 MG 12 hr tablet Take 1 tablet (500 mg total) by mouth 2 (two) times daily.  . sertraline (ZOLOFT) 100 MG tablet TAKE 1 TABLET (100 MG TOTAL) BY MOUTH 2 (TWO) TIMES DAILY.  Marland Kitchen tiZANidine (ZANAFLEX) 4 MG tablet Take 1 tablet (4 mg total) by mouth every 8 (eight) hours as needed for muscle spasms.  Marland Kitchen topiramate (TOPAMAX) 50 MG tablet TAKE 1 TABLET (50 MG) BY MOUTH AT BEDTIME.  Marland Kitchen Vitamin D, Ergocalciferol, (DRISDOL) 50000 units CAPS capsule TAKE 1 CAPSULE (50,000 UNITS TOTAL) BY MOUTH EVERY 7 (SEVEN) DAYS.  Marland Kitchen warfarin (COUMADIN) 5 MG tablet TAKE AS DIRECTED BY COUMADIN CLINIC     Allergies:   Morphine and Shellfish-derived products   Social History   Socioeconomic History  . Marital status: Married    Spouse name: None  . Number of children: 4  . Years of education: 58  . Highest education level: None  Social Needs  . Financial resource strain: None  . Food insecurity - worry: None  . Food insecurity - inability: None  . Transportation needs - medical: None  . Transportation needs - non-medical: None  Occupational History  . Occupation: retired  Tobacco Use  . Smoking status: Never Smoker  . Smokeless tobacco: Never Used  Substance and Sexual Activity  . Alcohol use: No    Alcohol/week: 0.0 oz    Comment: stopped drinking in 1998  . Drug use: No  . Sexual activity: None  Other Topics Concern  . None  Social History Narrative   Patient is right handed.   Patient does not drink caffeine.     Family History:  The patient's family history includes Cancer in his maternal grandmother; Coronary artery disease in his brother and brother; Diabetes in his brother, brother, and mother; Heart attack in his unknown relative; Heart failure in his father and mother; Hypothyroidism in his brother; Irregular heart beat in his daughter; Other in his brother; Pancreatic cancer in his brother; Stroke in his brother.   ROS:   Please see the history  of present illness.  Review of Systems  Constitution: Negative.  HENT: Negative.   Cardiovascular: Positive for chest pain, dyspnea on exertion and irregular heartbeat.  Respiratory: Negative.   Endocrine: Negative.   Hematologic/Lymphatic: Negative.   Musculoskeletal: Negative.   Gastrointestinal: Negative.   Genitourinary: Negative.   Neurological: Negative.    All other systems reviewed and are negative.   PHYSICAL EXAM:   VS:  BP 124/62   Pulse 61   Ht _0  (1.753 m)   Wt 175 lb 12.8 oz (79.7 kg)   BMI 25.96 kg/m   Physical Exam  GEN: Well nourished, well developed, in no acute distress Neck: no JVD, carotid bruits, or masses Cardiac:RRR; positive S4, 1/6 systolic murmur at the left sternal border Respiratory:  clear to auscultation bilaterally, normal work of breathing GI: soft, nontender, nondistended, + BS Ext: without cyanosis, clubbing, or edema, Good distal pulses bilaterally MS: no deformity or atrophy  Skin: warm and dry, no rash Neuro:  Alert and Oriented x 3, Strength and sensation are intact Psych: euthymic mood, full affect  Wt Readings from Last 3 Encounters:  12/13/17 175 lb 12.8 oz (79.7 kg)  11/21/17 172 lb (78 kg)  10/18/17 171 lb (77.6 kg)      Studies/Labs Reviewed:   EKG:  EKG is not  ordered today.  The ekg reviewed from the emergency room 12/11/17 showed normal sinus rhythm with right bundle branch block, no acute change.  Recent Labs: 10/18/2017: ALT 10; TSH 1.96 12/11/2017: BUN 17; Creatinine, Ser 1.36; Hemoglobin 12.9; Platelets 206; Potassium 3.7; Sodium 135   Lipid Panel    Component Value Date/Time   CHOL 121 10/18/2017 1210   CHOL 99 (L) 09/27/2017 0823   TRIG 284.0 (H) 10/18/2017 1210   HDL 30.20 (L) 10/18/2017 1210   HDL 30 (L) 09/27/2017 0823   CHOLHDL 4 10/18/2017 1210   VLDL 56.8 (H) 10/18/2017 1210   LDLCALC 35 09/27/2017 0823   LDLDIRECT 63.0 10/18/2017 1210    Additional studies/ records that were reviewed today  include:    Cardiac catheterization 6/16: 1. Prox LAD to Mid LAD lesion, 90% stenosed. Mid LAD to Dist LAD lesion, 100% stenosed within a bare metal stent (from Pre-CABG). 2. Ost 2nd Diag to 2nd Diag lesion, 100% stenosed within a BMS (pre-CABG) 3. Post Atrio lesion, 100% stenosed. 4. Widely patent DES stent to the mid RCA just prior to the bifurcation into RPDA and RPAV 5. Widely patent LIMA to a Small downstream distal LAD - minimal CAD 6. Widely patent saphenous vein grafts to the D1, D2 and RPAV 7. The left ventricular systolic function is normal. 8. No new lesion to explain unstable angina    Echo 4/5/17Study Conclusions   - Left ventricle: The cavity size was normal. There was mild   concentric hypertrophy. Systolic function was normal. The   estimated ejection fraction was in the range of 55% to 60%. Wall   motion was normal; there were no regional wall motion   abnormalities. There was an increased relative contribution of   atrial contraction to ventricular filling. Doppler parameters are   consistent with abnormal left ventricular relaxation (grade 1   diastolic dysfunction). - Mitral valve: There was mild regurgitation. - Left atrium: The atrium was mildly dilated.      ASSESSMENT:    1. Coronary artery disease involving native coronary artery of native heart with other form of angina pectoris ([place])   2. Essential hypertension   3. Chronic diastolic CHF (congestive heart  failure) ([place])   4. Atrial fibrillation, unspecified type ([place])      PLAN:  In order of problems listed above:  Chest pain gradually worsened over the past several months but significant change in the past 4-5 days occurring with minimal exertion and relieved with nitroglycerin.  Discussed with [name] who recommends he undergo cardiac catheterization.  Last creatinine was 1.36.  He is on Coumadin.  INR today is 2.2.  Patient is  agreeable to cardiac catheterization.  We will schedule for this  Thursday with [name].  Hold Coumadin today and tomorrow.  He will need an INR checked Thursday morning in the Cath Lab.  Check other labs today.  I have reviewed the risks, indications, and alternatives to angioplasty and stenting with the patient. Risks include but are not limited to bleeding, infection, vascular injury, stroke, myocardial infection, arrhythmia, kidney injury, radiation-related injury in the case of prolonged fluoroscopy use, emergency cardiac surgery, and death. The patient understands the risks of serious complication is low (<5%) and he agrees to proceed.     CAD status post CABG in 2000, DES to the RCA in 2009, patent grafts and stent in 2016.  Chronic exertional angina managed with sublingual nitroglycerin.  In the ER yesterday with chest pain troponins negative and EKG unchanged. Scheduled for cath Thursday.  Essential hypertension blood pressure controlled.  He has had increased dizziness and is on Midrin.  He says this has not been a problem in a while.  Chronic diastolic CHF normal LVEF grade 1 DD on echo 03/2016  Atrial fibrillation on Coumadin and amiodarone.      Medication Adjustments/Labs and Tests Ordered: Current medicines are reviewed at length with the patient today.  Concerns regarding medicines are outlined above.  Medication changes, Labs and Tests ordered today are listed in the Patient Instructions below. There are no Patient Instructions on file for this visit.   Sumner Boast, PA-C  12/13/2017 9:00 AM    [facility] [place], [place], [place]  [address] Phone: [phone]; Fax: [phone]

## 2017-12-12 NOTE — Discharge Instructions (Signed)
You were seen today for chest pain.  Your workup today is reassuring.  However, you need to follow-up very closely with your cardiologist for reevaluation.  If you develop any new or worsening symptoms you should be reevaluated immediately.

## 2017-12-13 ENCOUNTER — Ambulatory Visit: Payer: Medicare PPO | Admitting: Physician Assistant

## 2017-12-13 ENCOUNTER — Ambulatory Visit (INDEPENDENT_AMBULATORY_CARE_PROVIDER_SITE_OTHER): Payer: Medicare PPO | Admitting: *Deleted

## 2017-12-13 ENCOUNTER — Encounter: Payer: Self-pay | Admitting: Physician Assistant

## 2017-12-13 VITALS — BP 124/62 | HR 61 | Ht 69.0 in | Wt 175.8 lb

## 2017-12-13 DIAGNOSIS — I10 Essential (primary) hypertension: Secondary | ICD-10-CM | POA: Diagnosis not present

## 2017-12-13 DIAGNOSIS — I5032 Chronic diastolic (congestive) heart failure: Secondary | ICD-10-CM | POA: Diagnosis not present

## 2017-12-13 DIAGNOSIS — I480 Paroxysmal atrial fibrillation: Secondary | ICD-10-CM

## 2017-12-13 DIAGNOSIS — I25118 Atherosclerotic heart disease of native coronary artery with other forms of angina pectoris: Secondary | ICD-10-CM | POA: Diagnosis not present

## 2017-12-13 DIAGNOSIS — I4891 Unspecified atrial fibrillation: Secondary | ICD-10-CM | POA: Diagnosis not present

## 2017-12-13 DIAGNOSIS — Z5181 Encounter for therapeutic drug level monitoring: Secondary | ICD-10-CM

## 2017-12-13 DIAGNOSIS — I208 Other forms of angina pectoris: Secondary | ICD-10-CM

## 2017-12-13 DIAGNOSIS — Z7901 Long term (current) use of anticoagulants: Secondary | ICD-10-CM

## 2017-12-13 LAB — CBC WITH DIFFERENTIAL/PLATELET
BASOS: 0 %
Basophils Absolute: 0 10*3/uL (ref 0.0–0.2)
EOS (ABSOLUTE): 0.2 10*3/uL (ref 0.0–0.4)
EOS: 3 %
HEMATOCRIT: 36.6 % — AB (ref 37.5–51.0)
HEMOGLOBIN: 12.3 g/dL — AB (ref 13.0–17.7)
IMMATURE GRANS (ABS): 0 10*3/uL (ref 0.0–0.1)
IMMATURE GRANULOCYTES: 0 %
Lymphocytes Absolute: 2.5 10*3/uL (ref 0.7–3.1)
Lymphs: 36 %
MCH: 29.9 pg (ref 26.6–33.0)
MCHC: 33.6 g/dL (ref 31.5–35.7)
MCV: 89 fL (ref 79–97)
MONOCYTES: 8 %
MONOS ABS: 0.6 10*3/uL (ref 0.1–0.9)
NEUTROS PCT: 53 %
Neutrophils Absolute: 3.5 10*3/uL (ref 1.4–7.0)
Platelets: 183 10*3/uL (ref 150–379)
RBC: 4.12 x10E6/uL — AB (ref 4.14–5.80)
RDW: 14.8 % (ref 12.3–15.4)
WBC: 6.8 10*3/uL (ref 3.4–10.8)

## 2017-12-13 LAB — BASIC METABOLIC PANEL
BUN/Creatinine Ratio: 10 (ref 10–24)
BUN: 12 mg/dL (ref 8–27)
CO2: 20 mmol/L (ref 20–29)
CREATININE: 1.24 mg/dL (ref 0.76–1.27)
Calcium: 8.9 mg/dL (ref 8.6–10.2)
Chloride: 103 mmol/L (ref 96–106)
GFR calc Af Amer: 68 mL/min/{1.73_m2} (ref >59)
GFR, EST NON AFRICAN AMERICAN: 59 mL/min/{1.73_m2} — AB (ref >59)
Glucose: 454 mg/dL — ABNORMAL HIGH (ref 65–99)
Potassium: 5 mmol/L (ref 3.5–5.2)
SODIUM: 136 mmol/L (ref 134–144)

## 2017-12-13 LAB — PROTIME-INR
INR: 2.4 — ABNORMAL HIGH (ref 0.8–1.2)
PROTHROMBIN TIME: 23.7 s — AB (ref 9.1–12.0)

## 2017-12-13 LAB — POCT INR: INR: 2.2

## 2017-12-13 NOTE — Patient Instructions (Addendum)
Medication Instructions:  Your provider recommends that you continue on your current medications as directed. Please refer to the Current Medication list given to you today.    Labwork: TODAY: pre-procedure labs  Testing/Procedures: Your physician has requested that you have a cardiac catheterization. Cardiac catheterization is used to diagnose and/or treat various heart conditions. Doctors may recommend this procedure for a number of different reasons. The most common reason is to evaluate chest pain. Chest pain can be a symptom of coronary artery disease (CAD), and cardiac catheterization can show whether plaque is narrowing or blocking your heart's arteries. This procedure is also used to evaluate the valves, as well as measure the blood flow and oxygen levels in different parts of your heart. For further information please visit [email or web address]. Please follow instruction sheet, as given.  Follow-Up: Your provider recommends that you schedule a follow-up appointment AS NEEDED pending study results.  Any Other Special Instructions Will Be Listed Below (If Applicable).   [facility] [address] [place] [address] [place] [address] Dept: [phone] Loc: [phone]  DEVIN GANAWAY  12/13/2017  You are scheduled for a Cardiac Catheterization on Thursday, January 10 with Dr. Larae Grooms.  1. Please arrive at the [facility] (Main Entrance A) at [facility]: [address] [place],  [address] at 11:30 AM (two hours before your procedure to ensure your preparation). Free valet parking service is available.   Special note: Every effort is made to have your procedure done on time. Please understand that emergencies sometimes delay scheduled procedures.  2. Diet: Do not eat or drink anything after midnight prior to your procedure except sips of water to take medications.  3. Labs:  TODAY  4. Medication instructions in preparation for your procedure:  1) HOLD COUMADIN UNTIL procedure.   2) HOLD INSULIN GLARGINE the night before your procedure.  3) HOLD INSULIN ASPART the day of the procedure until you know you can eat.  4) TAKE ASPIRIN 81 mg the morning of your procedure.  5) you may take all other medications as directed.  5. Plan for one night stay--bring personal belongings. 6. Bring a current list of your medications and current insurance cards. 7. You MUST have a responsible person to drive you home. 8. Someone MUST be with you the first 24 hours after you arrive home or your discharge will be delayed. 9. Please wear clothes that are easy to get on and off and wear slip-on shoes.  Thank you for allowing [place] to care for you!   --  Invasive Cardiovascular services     If you need a refill on your cardiac medications before your next appointment, please call your pharmacy.

## 2017-12-13 NOTE — Patient Instructions (Signed)
Description   Hold today and tomorrow's dose, then resume after Cath per doctor's instructions. When you resume restart same dose of coumadin  1 tablet everyday except 1.5 tablets on Mondays and Fridays.  Repeat INR in 10 days. Remain consistent with eating 2 servings each week of leafy green vegetable. Call us with any medication changes [phone] Coumadin Clinic. Main # [phone].

## 2017-12-14 ENCOUNTER — Telehealth: Payer: Self-pay

## 2017-12-14 NOTE — Telephone Encounter (Signed)
Patient contacted pre-catheterization at [facility] scheduled for:  12/15/2017 @ 1330 Verified arrival time and place:  NT @ 1130 Confirmed AM meds to be taken pre-cath with sip of water: Hold warfarin-last dose 12/12/2017 Hold long acting insulin tonight No insulin morning of procedure Take ASA Confirmed patient has responsible person to drive home post procedure and observe patient for 24 hours:  yes Addl concerns:  none

## 2017-12-15 ENCOUNTER — Encounter (HOSPITAL_COMMUNITY)
Admission: RE | Disposition: A | Discharge status: Home / Self Care | Payer: Self-pay | Source: Ambulatory Visit | Attending: Interventional Cardiology

## 2017-12-15 ENCOUNTER — Encounter (HOSPITAL_COMMUNITY): Payer: Self-pay | Admitting: *Deleted

## 2017-12-15 ENCOUNTER — Ambulatory Visit (HOSPITAL_COMMUNITY)
Admission: RE | Admit: 2017-12-15 | Discharge: 2017-12-15 | Disposition: A | Discharge status: Home / Self Care | Payer: Medicare PPO | Source: Ambulatory Visit | Attending: Interventional Cardiology | Admitting: Interventional Cardiology

## 2017-12-15 DIAGNOSIS — Z79899 Other long term (current) drug therapy: Secondary | ICD-10-CM | POA: Insufficient documentation

## 2017-12-15 DIAGNOSIS — I110 Hypertensive heart disease with heart failure: Secondary | ICD-10-CM | POA: Diagnosis not present

## 2017-12-15 DIAGNOSIS — I951 Orthostatic hypotension: Secondary | ICD-10-CM | POA: Insufficient documentation

## 2017-12-15 DIAGNOSIS — I25110 Atherosclerotic heart disease of native coronary artery with unstable angina pectoris: Secondary | ICD-10-CM | POA: Diagnosis not present

## 2017-12-15 DIAGNOSIS — G2581 Restless legs syndrome: Secondary | ICD-10-CM | POA: Insufficient documentation

## 2017-12-15 DIAGNOSIS — K219 Gastro-esophageal reflux disease without esophagitis: Secondary | ICD-10-CM | POA: Insufficient documentation

## 2017-12-15 DIAGNOSIS — Z8719 Personal history of other diseases of the digestive system: Secondary | ICD-10-CM | POA: Insufficient documentation

## 2017-12-15 DIAGNOSIS — D509 Iron deficiency anemia, unspecified: Secondary | ICD-10-CM | POA: Diagnosis not present

## 2017-12-15 DIAGNOSIS — I6782 Cerebral ischemia: Secondary | ICD-10-CM | POA: Insufficient documentation

## 2017-12-15 DIAGNOSIS — Z87442 Personal history of urinary calculi: Secondary | ICD-10-CM | POA: Insufficient documentation

## 2017-12-15 DIAGNOSIS — R002 Palpitations: Secondary | ICD-10-CM | POA: Insufficient documentation

## 2017-12-15 DIAGNOSIS — Z9181 History of falling: Secondary | ICD-10-CM | POA: Diagnosis not present

## 2017-12-15 DIAGNOSIS — E785 Hyperlipidemia, unspecified: Secondary | ICD-10-CM | POA: Insufficient documentation

## 2017-12-15 DIAGNOSIS — I480 Paroxysmal atrial fibrillation: Secondary | ICD-10-CM | POA: Diagnosis not present

## 2017-12-15 DIAGNOSIS — I208 Other forms of angina pectoris: Secondary | ICD-10-CM

## 2017-12-15 DIAGNOSIS — F419 Anxiety disorder, unspecified: Secondary | ICD-10-CM | POA: Insufficient documentation

## 2017-12-15 DIAGNOSIS — Z7951 Long term (current) use of inhaled steroids: Secondary | ICD-10-CM | POA: Insufficient documentation

## 2017-12-15 DIAGNOSIS — Z7901 Long term (current) use of anticoagulants: Secondary | ICD-10-CM | POA: Diagnosis not present

## 2017-12-15 DIAGNOSIS — Z981 Arthrodesis status: Secondary | ICD-10-CM | POA: Insufficient documentation

## 2017-12-15 DIAGNOSIS — Z951 Presence of aortocoronary bypass graft: Secondary | ICD-10-CM | POA: Insufficient documentation

## 2017-12-15 DIAGNOSIS — D649 Anemia, unspecified: Secondary | ICD-10-CM | POA: Diagnosis not present

## 2017-12-15 DIAGNOSIS — R0600 Dyspnea, unspecified: Secondary | ICD-10-CM | POA: Insufficient documentation

## 2017-12-15 DIAGNOSIS — I4510 Unspecified right bundle-branch block: Secondary | ICD-10-CM | POA: Diagnosis not present

## 2017-12-15 DIAGNOSIS — E559 Vitamin D deficiency, unspecified: Secondary | ICD-10-CM | POA: Insufficient documentation

## 2017-12-15 DIAGNOSIS — M47812 Spondylosis without myelopathy or radiculopathy, cervical region: Secondary | ICD-10-CM | POA: Insufficient documentation

## 2017-12-15 DIAGNOSIS — Z794 Long term (current) use of insulin: Secondary | ICD-10-CM | POA: Insufficient documentation

## 2017-12-15 DIAGNOSIS — G4733 Obstructive sleep apnea (adult) (pediatric): Secondary | ICD-10-CM | POA: Diagnosis not present

## 2017-12-15 DIAGNOSIS — I2510 Atherosclerotic heart disease of native coronary artery without angina pectoris: Secondary | ICD-10-CM | POA: Diagnosis present

## 2017-12-15 DIAGNOSIS — E079 Disorder of thyroid, unspecified: Secondary | ICD-10-CM | POA: Insufficient documentation

## 2017-12-15 DIAGNOSIS — I5032 Chronic diastolic (congestive) heart failure: Secondary | ICD-10-CM | POA: Diagnosis not present

## 2017-12-15 DIAGNOSIS — E119 Type 2 diabetes mellitus without complications: Secondary | ICD-10-CM | POA: Insufficient documentation

## 2017-12-15 DIAGNOSIS — Z833 Family history of diabetes mellitus: Secondary | ICD-10-CM | POA: Insufficient documentation

## 2017-12-15 DIAGNOSIS — M25561 Pain in right knee: Secondary | ICD-10-CM | POA: Insufficient documentation

## 2017-12-15 DIAGNOSIS — H9190 Unspecified hearing loss, unspecified ear: Secondary | ICD-10-CM | POA: Diagnosis not present

## 2017-12-15 DIAGNOSIS — I25118 Atherosclerotic heart disease of native coronary artery with other forms of angina pectoris: Secondary | ICD-10-CM | POA: Diagnosis not present

## 2017-12-15 DIAGNOSIS — Z8249 Family history of ischemic heart disease and other diseases of the circulatory system: Secondary | ICD-10-CM | POA: Insufficient documentation

## 2017-12-15 DIAGNOSIS — Z885 Allergy status to narcotic agent status: Secondary | ICD-10-CM | POA: Insufficient documentation

## 2017-12-15 DIAGNOSIS — F319 Bipolar disorder, unspecified: Secondary | ICD-10-CM | POA: Insufficient documentation

## 2017-12-15 DIAGNOSIS — Z800 Family history of malignant neoplasm of digestive organs: Secondary | ICD-10-CM | POA: Insufficient documentation

## 2017-12-15 DIAGNOSIS — Z91013 Allergy to seafood: Secondary | ICD-10-CM | POA: Insufficient documentation

## 2017-12-15 DIAGNOSIS — Z823 Family history of stroke: Secondary | ICD-10-CM | POA: Insufficient documentation

## 2017-12-15 DIAGNOSIS — Z955 Presence of coronary angioplasty implant and graft: Secondary | ICD-10-CM | POA: Insufficient documentation

## 2017-12-15 DIAGNOSIS — Z8349 Family history of other endocrine, nutritional and metabolic diseases: Secondary | ICD-10-CM | POA: Insufficient documentation

## 2017-12-15 HISTORY — PX: ULTRASOUND GUIDANCE FOR VASCULAR ACCESS: SHX6516

## 2017-12-15 HISTORY — PX: LEFT HEART CATH AND CORS/GRAFTS ANGIOGRAPHY: CATH118250

## 2017-12-15 LAB — GLUCOSE, CAPILLARY
GLUCOSE-CAPILLARY: 140 mg/dL — AB (ref 65–99)
GLUCOSE-CAPILLARY: 56 mg/dL — AB (ref 65–99)
Glucose-Capillary: 101 mg/dL — ABNORMAL HIGH (ref 65–99)

## 2017-12-15 LAB — PROTIME-INR
INR: 1.45
Prothrombin Time: 17.5 seconds — ABNORMAL HIGH (ref 11.4–15.2)

## 2017-12-15 SURGERY — LEFT HEART CATH AND CORS/GRAFTS ANGIOGRAPHY
Anesthesia: LOCAL

## 2017-12-15 MED ORDER — FENTANYL CITRATE (PF) 100 MCG/2ML IJ SOLN
INTRAMUSCULAR | Status: DC | PRN
  Administered 2017-12-15 (×2): 25 ug via INTRAVENOUS

## 2017-12-15 MED ORDER — MIDAZOLAM HCL 2 MG/2ML IJ SOLN
INTRAMUSCULAR | Status: DC | PRN
  Administered 2017-12-15 (×2): 1 mg via INTRAVENOUS

## 2017-12-15 MED ORDER — SODIUM CHLORIDE 0.9 % IV SOLN: 250.0000 mL | INTRAVENOUS | Status: DC | PRN

## 2017-12-15 MED ORDER — SODIUM CHLORIDE 0.9% FLUSH: 3.0000 mL | INTRAVENOUS | Status: DC | PRN

## 2017-12-15 MED ORDER — MIDAZOLAM HCL 2 MG/2ML IJ SOLN
INTRAMUSCULAR | Status: AC
  Filled 2017-12-15: qty 2

## 2017-12-15 MED ORDER — LIDOCAINE HCL (PF) 1 % IJ SOLN
INTRAMUSCULAR | Status: DC | PRN
  Administered 2017-12-15: 17 mL

## 2017-12-15 MED ORDER — HEPARIN (PORCINE) IN NACL 2-0.9 UNIT/ML-% IJ SOLN
INTRAMUSCULAR | Status: AC | PRN
  Administered 2017-12-15: 1000 mL

## 2017-12-15 MED ORDER — IOPAMIDOL (ISOVUE-370) INJECTION 76%
INTRAVENOUS | Status: AC
  Filled 2017-12-15: qty 100

## 2017-12-15 MED ORDER — ASPIRIN 81 MG PO CHEW: 81.0000 mg | CHEWABLE_TABLET | ORAL | Status: DC

## 2017-12-15 MED ORDER — SODIUM CHLORIDE 0.9 % IV SOLN: INTRAVENOUS | Status: DC

## 2017-12-15 MED ORDER — SODIUM CHLORIDE 0.9% FLUSH: 3.0000 mL | Freq: Two times a day (BID) | INTRAVENOUS | Status: DC

## 2017-12-15 MED ORDER — SODIUM CHLORIDE 0.9 % IV SOLN
INTRAVENOUS | Status: DC
  Administered 2017-12-15: 12:00:00 via INTRAVENOUS

## 2017-12-15 MED ORDER — LIDOCAINE HCL (PF) 1 % IJ SOLN
INTRAMUSCULAR | Status: AC
  Filled 2017-12-15: qty 30

## 2017-12-15 MED ORDER — CEFAZOLIN SODIUM-DEXTROSE 1-4 GM/50ML-% IV SOLN
INTRAVENOUS | Status: AC
  Administered 2017-12-15: 1 g via INTRAVENOUS
  Filled 2017-12-15: qty 50

## 2017-12-15 MED ORDER — HEPARIN (PORCINE) IN NACL 2-0.9 UNIT/ML-% IJ SOLN
INTRAMUSCULAR | Status: AC
  Filled 2017-12-15: qty 1000

## 2017-12-15 MED ORDER — FENTANYL CITRATE (PF) 100 MCG/2ML IJ SOLN
INTRAMUSCULAR | Status: AC
  Filled 2017-12-15: qty 2

## 2017-12-15 MED ORDER — IOPAMIDOL (ISOVUE-370) INJECTION 76%
INTRAVENOUS | Status: DC | PRN
  Administered 2017-12-15: 80 mL

## 2017-12-15 MED ORDER — CEFAZOLIN SODIUM-DEXTROSE 1-4 GM/50ML-% IV SOLN
1.0000 g | Freq: Once | INTRAVENOUS | Status: AC
  Administered 2017-12-15: 1 g via INTRAVENOUS
  Filled 2017-12-15: qty 50

## 2017-12-15 SURGICAL SUPPLY — 10 items
CATH INFINITI 5FR MULTPACK ANG (CATHETERS) ×1 IMPLANT
COVER PRB 48X5XTLSCP FOLD TPE (BAG) IMPLANT
COVER PROBE 5X48 (BAG) ×3
DEVICE WIRE ANGIOSEAL 6FR (Vascular Products) ×1 IMPLANT
KIT HEART LEFT (KITS) ×3 IMPLANT
PACK CARDIAC CATHETERIZATION (CUSTOM PROCEDURE TRAY) ×3 IMPLANT
SHEATH PINNACLE 5F 10CM (SHEATH) ×1 IMPLANT
TRANSDUCER W/STOPCOCK (MISCELLANEOUS) ×3 IMPLANT
TUBING CIL FLEX 10 FLL-RA (TUBING) ×3 IMPLANT
WIRE EMERALD 3MM-J .035X150CM (WIRE) ×1 IMPLANT

## 2017-12-15 NOTE — Interval H&P Note (Signed)
Cath Lab Visit (complete for each Cath Lab visit)  Clinical Evaluation Leading to the Procedure:   ACS: No.  Non-ACS:    Anginal Classification: CCS III  Anti-ischemic medical therapy: Minimal Therapy (1 class of medications)  Non-Invasive Test Results: No non-invasive testing performed  Prior CABG: Previous CABG   Unstable angina   History and Physical Interval Note:  12/15/2017 4:50 PM  [name] [name]  has presented today for surgery, with the diagnosis of cp  The various methods of treatment have been discussed with the patient and family. After consideration of risks, benefits and other options for treatment, the patient has consented to  Procedure(s): LEFT HEART CATH AND CORS/GRAFTS ANGIOGRAPHY (N/A) as a surgical intervention .  The patient's history has been reviewed, patient examined, no change in status, stable for surgery.  I have reviewed the patient's chart and labs.  Questions were answered to the patient's satisfaction.     Larae Grooms

## 2017-12-15 NOTE — Discharge Instructions (Signed)

## 2017-12-16 ENCOUNTER — Encounter (HOSPITAL_COMMUNITY): Payer: Self-pay | Admitting: Interventional Cardiology

## 2017-12-16 MED FILL — LEVOTHYROXINE 88 MCG TABLET: 88 | 30 days supply | Qty: 30 | Fill #0

## 2017-12-19 ENCOUNTER — Ambulatory Visit: Payer: Medicare PPO | Admitting: Family Medicine

## 2017-12-23 ENCOUNTER — Ambulatory Visit (INDEPENDENT_AMBULATORY_CARE_PROVIDER_SITE_OTHER): Payer: Medicare PPO | Admitting: *Deleted

## 2017-12-23 DIAGNOSIS — Z5181 Encounter for therapeutic drug level monitoring: Secondary | ICD-10-CM

## 2017-12-23 DIAGNOSIS — Z7901 Long term (current) use of anticoagulants: Secondary | ICD-10-CM

## 2017-12-23 DIAGNOSIS — I480 Paroxysmal atrial fibrillation: Secondary | ICD-10-CM | POA: Diagnosis not present

## 2017-12-23 DIAGNOSIS — I4891 Unspecified atrial fibrillation: Secondary | ICD-10-CM

## 2017-12-23 LAB — POCT INR: INR: 1.7

## 2017-12-23 NOTE — Patient Instructions (Signed)
Description   Today take 2 tablets then continue taking 1 tablet everyday except 1.5 tablets on Mondays and Fridays.  Repeat INR in 1 week. Remain consistent with eating 2 servings each week of leafy green vegetable. Call us with any medication changes [phone] Coumadin Clinic. Main # [phone].

## 2017-12-26 ENCOUNTER — Ambulatory Visit: Payer: Medicare PPO | Admitting: Endocrinology

## 2017-12-30 ENCOUNTER — Ambulatory Visit (INDEPENDENT_AMBULATORY_CARE_PROVIDER_SITE_OTHER): Payer: Medicare PPO

## 2017-12-30 DIAGNOSIS — Z7901 Long term (current) use of anticoagulants: Secondary | ICD-10-CM | POA: Diagnosis not present

## 2017-12-30 DIAGNOSIS — Z5181 Encounter for therapeutic drug level monitoring: Secondary | ICD-10-CM | POA: Diagnosis not present

## 2017-12-30 DIAGNOSIS — I480 Paroxysmal atrial fibrillation: Secondary | ICD-10-CM

## 2017-12-30 DIAGNOSIS — I4891 Unspecified atrial fibrillation: Secondary | ICD-10-CM

## 2017-12-30 LAB — POCT INR: INR: 2

## 2017-12-30 NOTE — Patient Instructions (Signed)
Description   Continue on same dosage 1 tablet everyday except 1.5 tablets on Mondays and Fridays.  Recheck in 3 weeks. Call us with any medication changes [phone] Coumadin Clinic. Main # [phone].

## 2018-01-04 ENCOUNTER — Other Ambulatory Visit (HOSPITAL_COMMUNITY): Payer: Self-pay | Admitting: Interventional Cardiology

## 2018-01-04 MED FILL — PANTOPRAZOLE SOD DR 40 MG T: 40 | 30 days supply | Qty: 30 | Fill #1

## 2018-01-04 MED FILL — PRAMIPEXOLE 1 MG TABLET: 1 | 30 days supply | Qty: 30 | Fill #1

## 2018-01-04 MED FILL — METOPROLOL SUCC ER 50 MG TA: 50 | 30 days supply | Qty: 45 | Fill #5

## 2018-01-04 MED FILL — DIVALPROEX SOD ER 500 MG TA: 500 | 30 days supply | Qty: 30 | Fill #1

## 2018-01-04 MED FILL — ATORVASTATIN 40 MG TABLET: 40 | 30 days supply | Qty: 45 | Fill #1

## 2018-01-04 MED FILL — MIDODRINE HCL 10 MG TABLET: 10 | 30 days supply | Qty: 90 | Fill #6

## 2018-01-05 MED FILL — WARFARIN SODIUM 5 MG TABLET: 5 | 30 days supply | Qty: 45 | Fill #0

## 2018-01-05 MED FILL — TOPIRAMATE 25 MG TAB: 25 | 24 days supply | Qty: 96 | Fill #0

## 2018-01-06 MED FILL — NovoLIN N 100 UNIT/ML SUSP: 100 | 25 days supply | Qty: 10 | Fill #2

## 2018-01-10 ENCOUNTER — Encounter: Payer: Self-pay | Admitting: Family Medicine

## 2018-01-10 ENCOUNTER — Ambulatory Visit (INDEPENDENT_AMBULATORY_CARE_PROVIDER_SITE_OTHER): Payer: Medicare PPO | Admitting: Family Medicine

## 2018-01-10 VITALS — BP 119/59 | HR 60 | Temp 98.0°F | Resp 14 | Ht 69.0 in | Wt 178.0 lb

## 2018-01-10 DIAGNOSIS — R410 Disorientation, unspecified: Secondary | ICD-10-CM | POA: Diagnosis not present

## 2018-01-10 DIAGNOSIS — E559 Vitamin D deficiency, unspecified: Secondary | ICD-10-CM | POA: Diagnosis not present

## 2018-01-10 DIAGNOSIS — I10 Essential (primary) hypertension: Secondary | ICD-10-CM

## 2018-01-10 DIAGNOSIS — E782 Mixed hyperlipidemia: Secondary | ICD-10-CM

## 2018-01-10 DIAGNOSIS — E1165 Type 2 diabetes mellitus with hyperglycemia: Secondary | ICD-10-CM | POA: Diagnosis not present

## 2018-01-10 DIAGNOSIS — Z79899 Other long term (current) drug therapy: Secondary | ICD-10-CM

## 2018-01-10 MED ORDER — INSULIN NPH (HUMAN) (ISOPHANE) 100 UNIT/ML ~~LOC~~ SUSP: 30.0000 [IU] | Freq: Two times a day (BID) | SUBCUTANEOUS | 3 refills | Status: DC

## 2018-01-10 NOTE — Patient Instructions (Signed)

## 2018-01-11 NOTE — Progress Notes (Signed)
Patient ID: [name] [name], male   DOB: August 18, 1946, 72 y.o.   MRN: [id number]   Subjective:    Patient ID: [name] [name], male    DOB: [birth year], 72 y.o.   MRN: [id number]  Chief Complaint  Patient presents with  . Follow-up    HPI Patient is in today for follow up accompanied by his wife and grandson. 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 He is currently undergoing work up with neurology due to some ongoing headaches and memory concerns as a result of his previous TBI while umpiring. No recent febrile illness or hospitalizations. His sugars continue to run high high 100s to 500 per wife. Denies CP/palp/SOB/HA/congestion/fevers/GI or GU c/o. Taking meds as prescribed  Past Medical History:  Diagnosis Date  . ACP (advance care planning) 05/11/2015  . Advance care planning 05/01/2015  . Anemia    hemoglobin 7.4, iron deficiency, January, 2011, 2 unit transfusion, endoscopy normal, capsule endoscopy February, 2011 no small bowel abnormalities.   Most likely source gastric erosions, followed by GI  . Anxiety   . CAD (coronary artery disease)    A. CABG in 2000,status post cardiac cath in 2006, 2009 ....continued chest pain and SOB despite oral medication adjestments including Ranexa. B. Cath November 2009/ mRCA - 2.75 x 23 Abbott Xience V drug-eluting stent ...11/26/2008 to distal  RCA leading to acute marginal.  C. Cath 07/2012 for CP - stable anatomy, med rx. d. cath 2015 and 05/30/2015 stable anatomy, consider Myoview if has CP again  . Cerebral ischemia    MRI November, 2010, chronic microvascular ischemia  . Chronic diastolic CHF (congestive heart failure) ([place]) 03/13/2016  . Concussion   . Depression    Bipolar  . Diabetes mellitus type II, uncontrolled ([place])   . Diabetes mellitus without complication ([place])   . Diarrhea 02/15/2016  . Dizziness   . Edema   . Ejection fraction    EF 60%, echo, July 31, 2012  . Falling episodes    these  have occurred in the past and again recurring 2011  . Family history of adverse reaction to anesthesia    "mother died during bypass surgery but not sure if it has to do with anesthesia"  . Gastric ulcer   . GERD (gastroesophageal reflux disease)   . H/O medication noncompliance    Due to loss of insurance  . Hard of hearing   . Headache 01/25/2016  . Hx of CABG    2000,  / one median sternotomy suture broken her chest x-ray November, 2010, no clinical significance  . Hyperlipidemia   . Hypertension    pt. denies  . Iron deficiency anemia   . Low back pain 06/12/2009   Qualifier: Diagnosis of  By: [name]   . Nausea with vomiting 01/25/2016  . Nephrolithiasis   . OSA (obstructive sleep apnea)   . Pain in limb 06/12/2009   Qualifier: Diagnosis of  By: [name]   . Palpitations    event recorder showed sinus rhythm  . RBBB (right bundle branch block)   . Restless leg   . Right knee pain 01/07/2015  . Shortness of breath    CPX April, 2011, mild functional limitation, no clear pulmonary or cardiac limitation, possible deconditioning and mild chronotropic incompetence( peak heart rate 130)  . Spondylosis    C5-6, C6-7 MRI 2010  . Syncope 03/2016  . Thyroid disease   .  Tubulovillous adenoma of colon 2007  . Vitamin D deficiency 05/17/2017  . Wears glasses     Past Surgical History:  Procedure Laterality Date  . ANTERIOR CERVICAL DECOMP/DISCECTOMY FUSION N/A 05/31/2016   Procedure: ANTERIOR CERVICAL DECOMPRESSION/DISCECTOMY FUSION CERVICAL FIVE-SIX,CERVICAL SIX-SEVEN;  Surgeon: [name], MD;  Location: [facility];  Service: Neurosurgery;  Laterality: N/A;  . CARDIAC CATHETERIZATION N/A 05/30/2015   Procedure: Left Heart Cath and Coronary Angiography;  Surgeon: [name], MD;  Location: [facility];  Service: Cardiovascular;  Laterality: N/A;  . COLONOSCOPY    . CORONARY ARTERY BYPASS GRAFT     2000  . ESOPHAGOGASTRODUODENOSCOPY    . LEFT HEART CATH  AND CORS/GRAFTS ANGIOGRAPHY N/A 12/15/2017   Procedure: LEFT HEART CATH AND CORS/GRAFTS ANGIOGRAPHY;  Surgeon: [name], MD;  Location: [facility];  Service: Cardiovascular;  Laterality: N/A;  . LEFT HEART CATHETERIZATION WITH CORONARY/GRAFT ANGIOGRAM N/A 08/01/2012   Procedure: LEFT HEART CATHETERIZATION WITH Beatrix Fetters;  Surgeon: [name], MD;  Location: [facility];  Service: Cardiovascular;  Laterality: N/A;  . LEFT HEART CATHETERIZATION WITH CORONARY/GRAFT ANGIOGRAM N/A 01/03/2015   Procedure: LEFT HEART CATHETERIZATION WITH Beatrix Fetters;  Surgeon: [name], MD;  Location: [facility] CATH LAB;  Service: Cardiovascular;  Laterality: N/A;  . NASAL SEPTUM SURGERY     UP3  . PERCUTANEOUS CORONARY STENT INTERVENTION (PCI-S)  10/2008   mRCA PCI  2.75 x 23 Abbott Xience V drug-eluting stent   . ULTRASOUND GUIDANCE FOR VASCULAR ACCESS  12/15/2017   Procedure: Ultrasound Guidance For Vascular Access;  Surgeon: [name], MD;  Location: [facility];  Service: Cardiovascular;;    Family History  Problem Relation Age of Onset  . Pancreatic cancer Brother   . Diabetes Brother   . Coronary artery disease Brother   . Stroke Brother   . Diabetes Brother   . Diabetes Mother   . Heart failure Mother   . Heart failure Father   . Hypothyroidism Brother   . Coronary artery disease Brother   . Other Brother        colon surgery  . Heart attack Unknown        Nephew  . Irregular heart beat Daughter   . Cancer Maternal Grandmother        unknown     Social History   Socioeconomic History  . Marital status: Married    Spouse name: Not on file  . Number of children: 4  . Years of education: 50  . Highest education level: Not on file  Social Needs  . Financial resource strain: Not on file  . Food insecurity - worry: Not on file  . Food insecurity - inability: Not on file  . Transportation needs - medical: Not on file  .  Transportation needs - non-medical: Not on file  Occupational History  . Occupation: retired  Tobacco Use  . Smoking status: Never Smoker  . Smokeless tobacco: Never Used  Substance and Sexual Activity  . Alcohol use: No    Alcohol/week: 0.0 oz    Comment: stopped drinking in 1998  . Drug use: No  . Sexual activity: Not on file  Other Topics Concern  . Not on file  Social History Narrative   Patient is right handed.   Patient does not drink caffeine.    Outpatient Medications Prior to Visit  Medication Sig Dispense Refill  . albuterol (PROVENTIL HFA;VENTOLIN HFA) 108 (90 Base)  MCG/ACT inhaler Inhale 2 puffs into the lungs every 6 (six) hours as needed for wheezing or shortness of breath. 1 Inhaler 0  . amiodarone (PACERONE) 400 MG tablet Take 200 mg by mouth daily.    Marland Kitchen atorvastatin (LIPITOR) 40 MG tablet TAKE 1 & 1/2 TABLETS (60 MG TOTAL) BY MOUTH DAILY. 45 tablet 3  . diclofenac sodium (VOLTAREN) 1 % GEL Apply 2 g topically daily as needed (for pain in affected area).    . divalproex (DEPAKOTE ER) 500 MG 24 hr tablet TAKE 1 TABLET (500 MG TOTAL) BY MOUTH DAILY. 30 tablet 2  . glucose blood (ACCU-CHEK AVIVA) test strip Use as directed to check blood sugar 4 times daily.  DX E11.9 200 each 6  . insulin aspart (NOVOLOG) 100 UNIT/ML injection Inject 45 Units into the skin 3 (three) times daily with meals. 45 mL 4  . isosorbide mononitrate (IMDUR) 60 MG 24 hr tablet TAKE TWO TABLETS BY MOUTH DAILY 180 tablet 0  . levothyroxine (SYNTHROID, LEVOTHROID) 88 MCG tablet TAKE 1 TABLET (88 MCG TOTAL) BY MOUTH DAILY BEFORE BREAKFAST. 30 tablet 6  . loperamide (IMODIUM A-D) 2 MG tablet Take 1 tablet (2 mg total) by mouth 4 (four) times daily as needed for diarrhea or loose stools. 30 tablet 0  . meclizine (ANTIVERT) 12.5 MG tablet Take 1 tablet (12.5 mg total) by mouth 3 (three) times daily as needed for dizziness. 10 tablet 0  . metoprolol succinate (TOPROL-XL) 50 MG 24 hr tablet TAKE 1&1/2  TABLETS BY MOUTH ONCE A DAY FOR A TOTAL OF 75 MG. 45 tablet 6  . midodrine (PROAMATINE) 10 MG tablet TAKE 1 TABLET (10 MG TOTAL) BY MOUTH 3 TIMES DAILY. 90 tablet 9  . nitroGLYCERIN (NITROSTAT) 0.4 MG SL tablet Place 1 tablet (0.4 mg total) under the tongue every 5 (five) minutes as needed for chest pain. 25 tablet 1  . ondansetron (ZOFRAN ODT) 4 MG disintegrating tablet Take 1 tablet (4 mg total) by mouth every 8 (eight) hours as needed for nausea or vomiting. 10 tablet 1  . pantoprazole (PROTONIX) 40 MG tablet TAKE 1 TABLET BY MOUTH TWICE DAILY FOR 2 WEEKS THEN DROP TO ONCE DAILY (Patient taking differently: 1 t po qd) 45 tablet 1  . pramipexole (MIRAPEX) 1 MG tablet TAKE 1/2 TO 1 TABLET BY MOUTH AT BEDTIME 30 tablet 2  . ranolazine (RANEXA) 500 MG 12 hr tablet Take 1 tablet (500 mg total) by mouth 2 (two) times daily. 60 tablet 3  . sertraline (ZOLOFT) 100 MG tablet TAKE 1 TABLET (100 MG TOTAL) BY MOUTH 2 (TWO) TIMES DAILY. 180 tablet 1  . topiramate (TOPAMAX) 50 MG tablet TAKE 1 TABLET (50 MG) BY MOUTH AT BEDTIME. 30 tablet 3  . Vitamin D, Ergocalciferol, (DRISDOL) 50000 units CAPS capsule TAKE 1 CAPSULE (50,000 UNITS TOTAL) BY MOUTH EVERY 7 (SEVEN) DAYS. 12 capsule 2  . warfarin (COUMADIN) 5 MG tablet TAKE AS DIRECTED BY COUMADIN CLINIC 45 tablet 3  . Insulin Glargine (TOUJEO SOLOSTAR) 300 UNIT/ML SOPN Inject 40 Units into the skin at bedtime. (Patient taking differently: Inject 50 Units into the skin at bedtime. ) 4.5 mL 4   No facility-administered medications prior to visit.     Allergies  Allergen Reactions  . Morphine Other (See Comments)    hallucinations  . Shellfish-Derived Products Itching    Review of Systems  Constitutional: Negative for fever and malaise/fatigue.  HENT: Negative for congestion.   Eyes: Negative for blurred vision.  Respiratory: Negative for shortness of breath.   Cardiovascular: Negative for chest pain, palpitations and leg swelling.  Gastrointestinal:  Negative for abdominal pain, blood in stool and nausea.  Genitourinary: Negative for dysuria and frequency.  Musculoskeletal: Negative for falls.  Skin: Negative for rash.  Neurological: Negative for dizziness, loss of consciousness and headaches.  Endo/Heme/Allergies: Negative for environmental allergies.  Psychiatric/Behavioral: Positive for memory loss. Negative for depression. The patient is nervous/anxious.        Objective:    Physical Exam  Constitutional: He is oriented to person, place, and time. He appears well-developed and well-nourished. No distress.  HENT:  Head: Normocephalic and atraumatic.  Nose: Nose normal.  Eyes: Right eye exhibits no discharge. Left eye exhibits no discharge.  Neck: Normal range of motion. Neck supple.  Cardiovascular: Normal rate and regular rhythm.  Pulmonary/Chest: Effort normal and breath sounds normal.  Abdominal: Soft. Bowel sounds are normal. There is no tenderness.  Musculoskeletal: He exhibits no edema.  Neurological: He is alert and oriented to person, place, and time.  Skin: Skin is warm and dry.  Psychiatric: He has a normal mood and affect.  Nursing note and vitals reviewed.   BP (!) 119/59 (BP Location: Left Arm, Patient Position: Sitting, Cuff Size: Normal)   Pulse 60   Temp 98 F (36.7 C) (Oral)   Resp 14   Ht 5\' 9"  (1.753 m)   Wt 178 lb (80.7 kg)   SpO2 98%   BMI 26.29 kg/m  Wt Readings from Last 3 Encounters:  01/10/18 178 lb (80.7 kg)  12/15/17 175 lb (79.4 kg)  12/13/17 175 lb 12.8 oz (79.7 kg)     Lab Results  Component Value Date   WBC 6.8 12/13/2017   HGB 12.3 (L) 12/13/2017   HCT 36.6 (L) 12/13/2017   PLT 183 12/13/2017   GLUCOSE 454 (H) 12/13/2017   CHOL 121 10/18/2017   TRIG 284.0 (H) 10/18/2017   HDL 30.20 (L) 10/18/2017   LDLDIRECT 63.0 10/18/2017   LDLCALC 35 09/27/2017   ALT 10 10/18/2017   AST 12 10/18/2017   NA 136 12/13/2017   K 5.0 12/13/2017   CL 103 12/13/2017   CREATININE 1.24  12/13/2017   BUN 12 12/13/2017   CO2 20 12/13/2017   TSH 1.96 10/18/2017   PSA 2.39 11/25/2010   INR 2.0 12/30/2017   HGBA1C 11.4 (H) 10/18/2017   MICROALBUR 1.1 05/01/2015    Lab Results  Component Value Date   TSH 1.96 10/18/2017   Lab Results  Component Value Date   WBC 6.8 12/13/2017   HGB 12.3 (L) 12/13/2017   HCT 36.6 (L) 12/13/2017   MCV 89 12/13/2017   PLT 183 12/13/2017   Lab Results  Component Value Date   NA 136 12/13/2017   K 5.0 12/13/2017   CO2 20 12/13/2017   GLUCOSE 454 (H) 12/13/2017   BUN 12 12/13/2017   CREATININE 1.24 12/13/2017   BILITOT 0.6 10/18/2017   ALKPHOS 66 10/18/2017   AST 12 10/18/2017   ALT 10 10/18/2017   PROT 7.0 10/18/2017   ALBUMIN 4.2 10/18/2017   CALCIUM 8.9 12/13/2017   ANIONGAP 7 12/11/2017   GFR 56.65 (L) 10/18/2017   Lab Results  Component Value Date   CHOL 121 10/18/2017   Lab Results  Component Value Date   HDL 30.20 (L) 10/18/2017   Lab Results  Component Value Date   LDLCALC 35 09/27/2017   Lab Results  Component Value Date   TRIG  284.0 (H) 10/18/2017   Lab Results  Component Value Date   CHOLHDL 4 10/18/2017   Lab Results  Component Value Date   HGBA1C 11.4 (H) 10/18/2017       Assessment & Plan:   Problem List Items Addressed This Visit    Essential hypertension (Chronic)    Well controlled, no changes to meds. Encouraged heart healthy diet such as the DASH diet and exercise as tolerated.       Diabetes mellitus type II, uncontrolled ([place]) (Chronic)    He is unable to purchase the Utojeo but has been willing to pick up the Novolin and is using it BID and novolog is tid. He is encouraged to increase his Novolin to 30 units bid from 20 and he is only taking the Novolog at 5-10 unit doses tid. He has an appointment with endocrinology soon.hgba1c acceptable, minimize simple carbs. Increase exercise as tolerated.       Relevant Medications   insulin NPH Human (HUMULIN N,NOVOLIN N) 100 UNIT/ML  injection   HLD (hyperlipidemia)    Encouraged heart healthy diet, increase exercise, avoid trans fats, consider a krill oil cap daily. Tolerating Atorvastatin.       Confusion    H/o TBI and now early cognitive impairment most likely. Is following with neurology and they are putting him through neuropsychology testing.      Vitamin D deficiency    Continue to monitor and supplement       Other Visit Diagnoses    High risk medication use    -  Primary   Relevant Orders   Pain Mgmt, Profile 8 w/Conf, U      I have discontinued Freeman A. Thul's Insulin Glargine. I have also changed his insulin NPH Human. Additionally, I am having him maintain his diclofenac sodium, topiramate, meclizine, albuterol, ondansetron, loperamide, amiodarone, sertraline, midodrine, metoprolol succinate, nitroGLYCERIN, ranolazine, glucose blood, levothyroxine, divalproex, isosorbide mononitrate, atorvastatin, insulin aspart, pramipexole, pantoprazole, Vitamin D (Ergocalciferol), and warfarin.  Meds ordered this encounter  Medications  . insulin NPH Human (HUMULIN N,NOVOLIN N) 100 UNIT/ML injection    Sig: Inject 0.3 mLs (30 Units total) into the skin 2 (two) times daily before a meal. Inject 10-20 Units Twice Daily as instructed    Dispense:  30 mL    Refill:  3    CMA served as scribe during this visit. History, Physical and Plan performed by medical provider. Documentation and orders reviewed and attested to.  Penni Homans, MD

## 2018-01-11 NOTE — Assessment & Plan Note (Signed)
Well controlled, no changes to meds. Encouraged heart healthy diet such as the DASH diet and exercise as tolerated.  °

## 2018-01-11 NOTE — Assessment & Plan Note (Signed)
Continue to monitor and supplement °

## 2018-01-11 NOTE — Assessment & Plan Note (Signed)
He is unable to purchase the Utojeo but has been willing to pick up the Novolin and is using it BID and novolog is tid. He is encouraged to increase his Novolin to 30 units bid from 20 and he is only taking the Novolog at 5-10 unit doses tid. He has an appointment with endocrinology soon.hgba1c acceptable, minimize simple carbs. Increase exercise as tolerated.

## 2018-01-11 NOTE — Assessment & Plan Note (Signed)
H/o TBI and now early cognitive impairment most likely. Is following with neurology and they are putting him through neuropsychology testing.

## 2018-01-11 NOTE — Assessment & Plan Note (Signed)
Encouraged heart healthy diet, increase exercise, avoid trans fats, consider a krill oil cap daily. Tolerating Atorvastatin 

## 2018-01-14 LAB — PAIN MGMT, PROFILE 8 W/CONF, U
6 ACETYLMORPHINE: NEGATIVE ng/mL (ref <10)
ALPHAHYDROXYMIDAZOLAM: NEGATIVE ng/mL (ref <50)
Alcohol Metabolites: NEGATIVE ng/mL (ref <500)
Alphahydroxyalprazolam: NEGATIVE ng/mL (ref <25)
Alphahydroxytriazolam: NEGATIVE ng/mL (ref <50)
Aminoclonazepam: NEGATIVE ng/mL (ref <25)
Amphetamines: NEGATIVE ng/mL (ref <500)
Benzodiazepines: NEGATIVE ng/mL (ref <100)
Buprenorphine, Urine: NEGATIVE ng/mL (ref <5)
CREATININE: 71.7 mg/dL
Cocaine Metabolite: NEGATIVE ng/mL (ref <150)
HYDROXYETHYLFLURAZEPAM: NEGATIVE ng/mL (ref <50)
Lorazepam: NEGATIVE ng/mL (ref <50)
MARIJUANA METABOLITE: NEGATIVE ng/mL (ref <20)
MDMA: NEGATIVE ng/mL (ref <500)
NORDIAZEPAM: NEGATIVE ng/mL (ref <50)
OPIATES: NEGATIVE ng/mL (ref <100)
OXAZEPAM: NEGATIVE ng/mL (ref <50)
OXYCODONE: NEGATIVE ng/mL (ref <100)
Oxidant: NEGATIVE ug/mL (ref <200)
Temazepam: NEGATIVE ng/mL (ref <50)
pH: 5.65 (ref 4.5–9.0)

## 2018-01-19 ENCOUNTER — Ambulatory Visit: Payer: Medicare PPO | Admitting: Endocrinology

## 2018-01-19 DIAGNOSIS — M542 Cervicalgia: Secondary | ICD-10-CM | POA: Diagnosis not present

## 2018-01-19 DIAGNOSIS — T148XXA Other injury of unspecified body region, initial encounter: Secondary | ICD-10-CM | POA: Diagnosis not present
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
## 2018-01-20 ENCOUNTER — Telehealth: Payer: Self-pay | Admitting: Neurology

## 2018-01-20 ENCOUNTER — Encounter: Payer: Self-pay | Admitting: Cardiology

## 2018-01-20 ENCOUNTER — Telehealth: Payer: Self-pay | Admitting: Family Medicine

## 2018-01-20 ENCOUNTER — Ambulatory Visit (INDEPENDENT_AMBULATORY_CARE_PROVIDER_SITE_OTHER): Payer: Medicare PPO | Admitting: Cardiology

## 2018-01-20 ENCOUNTER — Ambulatory Visit (INDEPENDENT_AMBULATORY_CARE_PROVIDER_SITE_OTHER): Payer: Medicare PPO | Admitting: Pharmacist

## 2018-01-20 VITALS — BP 124/76 | HR 58 | Ht 69.0 in | Wt 184.4 lb

## 2018-01-20 DIAGNOSIS — I25118 Atherosclerotic heart disease of native coronary artery with other forms of angina pectoris: Secondary | ICD-10-CM | POA: Diagnosis not present

## 2018-01-20 DIAGNOSIS — Z5181 Encounter for therapeutic drug level monitoring: Secondary | ICD-10-CM | POA: Diagnosis not present

## 2018-01-20 DIAGNOSIS — E119 Type 2 diabetes mellitus without complications: Secondary | ICD-10-CM

## 2018-01-20 DIAGNOSIS — I208 Other forms of angina pectoris: Secondary | ICD-10-CM | POA: Diagnosis not present

## 2018-01-20 DIAGNOSIS — Z794 Long term (current) use of insulin: Secondary | ICD-10-CM | POA: Diagnosis not present

## 2018-01-20 DIAGNOSIS — E785 Hyperlipidemia, unspecified: Secondary | ICD-10-CM

## 2018-01-20 DIAGNOSIS — I2510 Atherosclerotic heart disease of native coronary artery without angina pectoris: Secondary | ICD-10-CM

## 2018-01-20 DIAGNOSIS — Z7901 Long term (current) use of anticoagulants: Secondary | ICD-10-CM

## 2018-01-20 DIAGNOSIS — I480 Paroxysmal atrial fibrillation: Secondary | ICD-10-CM

## 2018-01-20 DIAGNOSIS — Z951 Presence of aortocoronary bypass graft: Secondary | ICD-10-CM

## 2018-01-20 LAB — POCT INR: INR: 2.2

## 2018-01-20 MED ORDER — RANOLAZINE ER 500 MG PO TB12: 500.0000 mg | ORAL_TABLET | Freq: Two times a day (BID) | ORAL | 0 refills | Status: DC

## 2018-01-20 NOTE — Telephone Encounter (Signed)
Called and spoke with Pt, advsd him to be here at 11:30 on wedn 2/20

## 2018-01-20 NOTE — Telephone Encounter (Signed)
yes
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
## 2018-01-20 NOTE — Telephone Encounter (Signed)
Copied from [place] [id number] [phone]. Topic: Quick Communication - See Telephone Encounter >> Jan 20, 2018  1:25 PM Rosalin Hawking wrote: CRM for notification. See Telephone encounter for:  01/20/18.   Pt came in office stating is having a lot of dizziness going on, pt has seen Dr Metta Clines (neurologist) during the last year, pt would like to get with the neurologist for an appt again but has not been successful getting in the neurologist office, pt wants to know if possible to have provider or referral coordinator to schedule pt an appt with neurologist for next wk. Please advise.

## 2018-01-20 NOTE — Telephone Encounter (Signed)
Dr Rockie Neighbours to work him in?

## 2018-01-20 NOTE — Patient Instructions (Addendum)
Medication Instructions:  Your physician has recommended you make the following change in your medication:  1.  RESTART The Ranexa 500 mg twice a day   Labwork: None ordered  Testing/Procedures: None ordered  Follow-Up: Your physician recommends that you schedule a follow-up appointment in: [place] APPT AS PLANNED   Any Other Special Instructions Will Be Listed Below (If Applicable).     If you need a refill on your cardiac medications before your next appointment, please call your pharmacy.

## 2018-01-20 NOTE — Patient Instructions (Signed)
Description   Continue on same dosage 1 tablet everyday except 1.5 tablets on Mondays and Fridays.  Recheck in 4 weeks. Call us with any medication changes [phone] [facility]. Main # [phone].

## 2018-01-23 NOTE — Telephone Encounter (Signed)
Please advise 

## 2018-01-23 NOTE — Telephone Encounter (Signed)
Patient has an appointment with Dr [name] on 01/24/2018 (today) at 11 am please make sure he knows

## 2018-01-24 ENCOUNTER — Ambulatory Visit (INDEPENDENT_AMBULATORY_CARE_PROVIDER_SITE_OTHER): Payer: Medicare PPO | Admitting: Neurology

## 2018-01-24 ENCOUNTER — Encounter: Payer: Self-pay | Admitting: Neurology

## 2018-01-24 VITALS — BP 108/64 | HR 60 | Ht 69.0 in | Wt 181.6 lb

## 2018-01-24 DIAGNOSIS — R51 Headache: Secondary | ICD-10-CM

## 2018-01-24 DIAGNOSIS — M4802 Spinal stenosis, cervical region: Secondary | ICD-10-CM | POA: Diagnosis not present

## 2018-01-24 DIAGNOSIS — G3184 Mild cognitive impairment, so stated: Secondary | ICD-10-CM | POA: Diagnosis not present

## 2018-01-24 DIAGNOSIS — M542 Cervicalgia: Secondary | ICD-10-CM | POA: Diagnosis not present

## 2018-01-24 DIAGNOSIS — S060X0A Concussion without loss of consciousness, initial encounter: Secondary | ICD-10-CM

## 2018-01-24 DIAGNOSIS — R569 Unspecified convulsions: Secondary | ICD-10-CM

## 2018-01-24 DIAGNOSIS — G4486 Cervicogenic headache: Secondary | ICD-10-CM

## 2018-01-24 MED ORDER — NORTRIPTYLINE HCL 10 MG PO CAPS: 10.0000 mg | ORAL_CAPSULE | Freq: Every day | ORAL | 2 refills | Status: DC
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
## 2018-01-24 NOTE — Patient Instructions (Addendum)
1.  Stop topiramate.  Instead, we will start nortriptyline 10mg  at bedtime.  If headaches not improved in 4 weeks, contact me and I will increase dose. 2.  Continue cyclobenzaprine as needed 3.  Continue Depakote ER 500mg  daily for seizure prevention 4.  Start turmeric 500mg  once to twice daily to reduce inflammation in the neck. 5.  I will refer you to Dr. Charlann Boxer for acute on chronic neck pain 6.  No driving.  I want you not to do anything for the next 2 weeks (no chores, no TV).  Just rest. 7.  Follow up

## 2018-01-24 NOTE — Telephone Encounter (Signed)
Patient notified

## 2018-01-25 ENCOUNTER — Ambulatory Visit: Payer: Medicare PPO | Admitting: Neurology

## 2018-01-25 NOTE — Progress Notes (Signed)
Sent PA on cover my meds

## 2018-01-26 NOTE — Progress Notes (Signed)
Rcvd approval for nortriptyline 10mg  thru 01/26/2020. Called [facility], LM on Drs line advising ok to fill nortriptyline.

## 2018-02-02 ENCOUNTER — Other Ambulatory Visit: Payer: Self-pay | Admitting: Family Medicine

## 2018-02-02 MED FILL — SERTRALINE HCL 100 MG TAB: 100 | 90 days supply | Qty: 180 | Fill #0

## 2018-02-02 MED FILL — NOVOLOG FLEXPEN SYRINGE: 100 | 25 days supply | Qty: 15 | Fill #1

## 2018-02-02 MED FILL — NovoLIN N 100 UNIT/ML SUSP: 100 | 17 days supply | Qty: 10 | Fill #0

## 2018-02-02 MED FILL — NORTRIPTYLINE HCL 10 MG CAP: 10 | 30 days supply | Qty: 30 | Fill #0

## 2018-02-06 ENCOUNTER — Encounter: Payer: Self-pay | Admitting: Endocrinology

## 2018-02-06 ENCOUNTER — Ambulatory Visit: Payer: Medicare PPO | Admitting: Endocrinology

## 2018-02-06 VITALS — BP 118/78 | HR 60 | Temp 97.6°F | Wt 189.0 lb

## 2018-02-06 DIAGNOSIS — M542 Cervicalgia: Secondary | ICD-10-CM | POA: Diagnosis not present

## 2018-02-06 DIAGNOSIS — E1165 Type 2 diabetes mellitus with hyperglycemia: Secondary | ICD-10-CM | POA: Diagnosis not present

## 2018-02-06 DIAGNOSIS — IMO0001 Reserved for inherently not codable concepts without codable children: Secondary | ICD-10-CM

## 2018-02-06 DIAGNOSIS — R209 Unspecified disturbances of skin sensation: Secondary | ICD-10-CM | POA: Diagnosis not present

## 2018-02-06 DIAGNOSIS — R51 Headache: Secondary | ICD-10-CM | POA: Diagnosis not present

## 2018-02-06 DIAGNOSIS — R519 Headache, unspecified: Secondary | ICD-10-CM

## 2018-02-06 LAB — POCT GLYCOSYLATED HEMOGLOBIN (HGB A1C): HEMOGLOBIN A1C: 10.7

## 2018-02-06 MED ORDER — INSULIN NPH (HUMAN) (ISOPHANE) 100 UNIT/ML ~~LOC~~ SUSP: 60.0000 [IU] | Freq: Every day | SUBCUTANEOUS | 3 refills | Status: DC

## 2018-02-06 MED ORDER — INSULIN ASPART 100 UNIT/ML FLEXPEN: PEN_INJECTOR | SUBCUTANEOUS | 11 refills | Status: DC

## 2018-02-06 MED FILL — WARFARIN SODIUM 5 MG TABLET: 5 | 30 days supply | Qty: 45 | Fill #1

## 2018-02-06 MED FILL — DIVALPROEX SOD ER 500 MG TA: 500 | 30 days supply | Qty: 30 | Fill #2

## 2018-02-06 MED FILL — PRAMIPEXOLE 1 MG TABLET: 1 | 30 days supply | Qty: 30 | Fill #2

## 2018-02-06 NOTE — Patient Instructions (Signed)
check your blood sugar 4 times a day.  vary the time of day when you check, between before the 3 meals, and at bedtime.  also check if you have symptoms of your blood sugar being too high or too low.  please keep a record of the readings and bring it to your next appointment here (or you can bring the meter itself).  You can write it on any piece of paper.  please call us sooner if your blood sugar goes below 70, or if you have a lot of readings over 200. Please take the insulin numbers listed below.  Please come back for a follow-up appointment in 1 month.

## 2018-02-06 NOTE — Progress Notes (Signed)
Subjective:    Patient ID: [name] [name], male    DOB: 1946/10/11, 72 y.o.   MRN: [id number]  HPI Pt returns for f/u of diabetes mellitus: DM type: Insulin-requiring type 2, although lean body habitus raises the possibility he is evolving type 1 Dx'ed: 7616 Complications: polyneuropathy, renal insuff, CAD and PAD Therapy: insulin since 2008 DKA: never Severe hypoglycemia: never Pancreatitis: never Pancreatic imaging:  Other: he takes multiple daily injections; he takes human insulin, due to cost. Interval history: no cbg record, but states cbg's vary from 60-500.  It is lowest in the afternoon, and highest at HS.  H.  He takes novolog 45 units 3 times a day (just before each meal), and NPH, 45 units QHS (not BID).  He says he does not miss the insulin.   Past Medical History:  Diagnosis Date  . ACP (advance care planning) 05/11/2015  . Advance care planning 05/01/2015  . Anemia    hemoglobin 7.4, iron deficiency, January, 2011, 2 unit transfusion, endoscopy normal, capsule endoscopy February, 2011 no small bowel abnormalities.   Most likely source gastric erosions, followed by GI  . Anxiety   . CAD (coronary artery disease)    A. CABG in 2000,status post cardiac cath in 2006, 2009 ....continued chest pain and SOB despite oral medication adjestments including Ranexa. B. Cath November 2009/ mRCA - 2.75 x 23 Abbott Xience V drug-eluting stent ...11/26/2008 to distal  RCA leading to acute marginal.  C. Cath 07/2012 for CP - stable anatomy, med rx. d. cath 2015 and 05/30/2015 stable anatomy, consider Myoview if has CP again  . Cerebral ischemia    MRI November, 2010, chronic microvascular ischemia  . Chronic diastolic CHF (congestive heart failure) ([place]) 03/13/2016  . Concussion   . Depression    Bipolar  . Diabetes mellitus type II, uncontrolled ([place])   . Diabetes mellitus without complication ([place])   . Diarrhea 02/15/2016  . Dizziness   . Edema   . Ejection fraction    EF 60%, echo,  July 31, 2012  . Falling episodes    these have occurred in the past and again recurring 2011  . Family history of adverse reaction to anesthesia    "mother died during bypass surgery but not sure if it has to do with anesthesia"  . Gastric ulcer   . GERD (gastroesophageal reflux disease)   . H/O medication noncompliance    Due to loss of insurance  . Hard of hearing   . Headache 01/25/2016  . Hx of CABG    2000,  / one median sternotomy suture broken her chest x-ray November, 2010, no clinical significance  . Hyperlipidemia   . Hypertension    pt. denies  . Iron deficiency anemia   . Low back pain 06/12/2009   Qualifier: Diagnosis of  By: [name]   . Nausea with vomiting 01/25/2016  . Nephrolithiasis   . OSA (obstructive sleep apnea)   . Pain in limb 06/12/2009   Qualifier: Diagnosis of  By: [name]   . Palpitations    event recorder showed sinus rhythm  . RBBB (right bundle branch block)   . Restless leg   . Right knee pain 01/07/2015  . Shortness of breath    CPX April, 2011, mild functional limitation, no clear pulmonary or cardiac limitation, possible deconditioning and mild chronotropic incompetence( peak heart rate 130)  . Spondylosis    C5-6, C6-7 MRI 2010  . Syncope 03/2016  .  Thyroid disease   . Tubulovillous adenoma of colon 2007  . Vitamin D deficiency 05/17/2017  . Wears glasses     Past Surgical History:  Procedure Laterality Date  . ANTERIOR CERVICAL DECOMP/DISCECTOMY FUSION N/A 05/31/2016   Procedure: ANTERIOR CERVICAL DECOMPRESSION/DISCECTOMY FUSION CERVICAL FIVE-SIX,CERVICAL SIX-SEVEN;  Surgeon: [name], MD;  Location: [facility];  Service: Neurosurgery;  Laterality: N/A;  . CARDIAC CATHETERIZATION N/A 05/30/2015   Procedure: Left Heart Cath and Coronary Angiography;  Surgeon: [name], MD;  Location: [facility];  Service: Cardiovascular;  Laterality: N/A;  . COLONOSCOPY    . CORONARY ARTERY BYPASS GRAFT     2000  .  ESOPHAGOGASTRODUODENOSCOPY    . LEFT HEART CATH AND CORS/GRAFTS ANGIOGRAPHY N/A 12/15/2017   Procedure: LEFT HEART CATH AND CORS/GRAFTS ANGIOGRAPHY;  Surgeon: [name], MD;  Location: [facility];  Service: Cardiovascular;  Laterality: N/A;  . LEFT HEART CATHETERIZATION WITH CORONARY/GRAFT ANGIOGRAM N/A 08/01/2012   Procedure: LEFT HEART CATHETERIZATION WITH Beatrix Fetters;  Surgeon: [name], MD;  Location: [facility] CATH LAB;  Service: Cardiovascular;  Laterality: N/A;  . LEFT HEART CATHETERIZATION WITH CORONARY/GRAFT ANGIOGRAM N/A 01/03/2015   Procedure: LEFT HEART CATHETERIZATION WITH Beatrix Fetters;  Surgeon: [name], MD;  Location: [facility] CATH LAB;  Service: Cardiovascular;  Laterality: N/A;  . NASAL SEPTUM SURGERY     UP3  . PERCUTANEOUS CORONARY STENT INTERVENTION (PCI-S)  10/2008   mRCA PCI  2.75 x 23 Abbott Xience V drug-eluting stent   . ULTRASOUND GUIDANCE FOR VASCULAR ACCESS  12/15/2017   Procedure: Ultrasound Guidance For Vascular Access;  Surgeon: [name], MD;  Location: [facility] CV LAB;  Service: Cardiovascular;;    Social History   Socioeconomic History  . Marital status: Married    Spouse name: Not on file  . Number of children: 4  . Years of education: 64  . Highest education level: Not on file  Social Needs  . Financial resource strain: Not on file  . Food insecurity - worry: Not on file  . Food insecurity - inability: Not on file  . Transportation needs - medical: Not on file  . Transportation needs - non-medical: Not on file  Occupational History  . Occupation: retired  Tobacco Use  . Smoking status: Never Smoker  . Smokeless tobacco: Never Used  Substance and Sexual Activity  . Alcohol use: No    Alcohol/week: 0.0 oz    Comment: stopped drinking in 1998  . Drug use: No  . Sexual activity: Not on file  Other Topics Concern  . Not on file  Social History Narrative   Patient is right handed.    Patient does not drink caffeine.    Current Outpatient Medications on File Prior to Visit  Medication Sig Dispense Refill  . amiodarone (PACERONE) 400 MG tablet Take 200 mg by mouth daily.    Marland Kitchen atorvastatin (LIPITOR) 40 MG tablet TAKE 1 & 1/2 TABLETS (60 MG TOTAL) BY MOUTH DAILY. 45 tablet 3  . diclofenac sodium (VOLTAREN) 1 % GEL Apply 2 g topically daily as needed (for pain in affected area).    . divalproex (DEPAKOTE ER) 500 MG 24 hr tablet TAKE 1 TABLET (500 MG TOTAL) BY MOUTH DAILY. 30 tablet 2  . glucose blood (ACCU-CHEK AVIVA) test strip Use as directed to check blood sugar 4 times daily.  DX E11.9 200 each 6  . isosorbide mononitrate (IMDUR) 60 MG 24 hr tablet TAKE TWO  TABLETS BY MOUTH DAILY 180 tablet 0  . levothyroxine (SYNTHROID, LEVOTHROID) 88 MCG tablet TAKE 1 TABLET (88 MCG TOTAL) BY MOUTH DAILY BEFORE BREAKFAST. 30 tablet 6  . loperamide (IMODIUM A-D) 2 MG tablet Take 1 tablet (2 mg total) by mouth 4 (four) times daily as needed for diarrhea or loose stools. 30 tablet 0  . metoprolol succinate (TOPROL-XL) 50 MG 24 hr tablet TAKE 1&1/2 TABLETS BY MOUTH ONCE A DAY FOR A TOTAL OF 75 MG. 45 tablet 6  . midodrine (PROAMATINE) 10 MG tablet TAKE 1 TABLET (10 MG TOTAL) BY MOUTH 3 TIMES DAILY. 90 tablet 9  . nitroGLYCERIN (NITROSTAT) 0.4 MG SL tablet Place 1 tablet (0.4 mg total) under the tongue every 5 (five) minutes as needed for chest pain. 25 tablet 1  . nortriptyline (PAMELOR) 10 MG capsule Take 1 capsule (10 mg total) by mouth at bedtime. 30 capsule 2  . ondansetron (ZOFRAN ODT) 4 MG disintegrating tablet Take 1 tablet (4 mg total) by mouth every 8 (eight) hours as needed for nausea or vomiting. 10 tablet 1  . pantoprazole (PROTONIX) 40 MG tablet TAKE 1 TABLET BY MOUTH TWICE DAILY FOR 2 WEEKS THEN DROP TO ONCE DAILY (Patient taking differently: 1 t po qd) 45 tablet 1  . pramipexole (MIRAPEX) 1 MG tablet TAKE 1/2 TO 1 TABLET BY MOUTH AT BEDTIME 30 tablet 2  . ranolazine (RANEXA) 500  MG 12 hr tablet Take 1 tablet (500 mg total) by mouth 2 (two) times daily. 14 tablet 0  . sertraline (ZOLOFT) 100 MG tablet TAKE 1 TABLET (100 MG TOTAL) BY MOUTH 2 (TWO) TIMES DAILY. 180 tablet 1  . Vitamin D, Ergocalciferol, (DRISDOL) 50000 units CAPS capsule TAKE 1 CAPSULE (50,000 UNITS TOTAL) BY MOUTH EVERY 7 (SEVEN) DAYS. 12 capsule 2  . warfarin (COUMADIN) 5 MG tablet TAKE AS DIRECTED BY COUMADIN CLINIC 45 tablet 3   No current facility-administered medications on file prior to visit.     Allergies  Allergen Reactions  . Morphine Other (See Comments)    hallucinations  . Shellfish-Derived Products Itching    Family History  Problem Relation Age of Onset  . Pancreatic cancer Brother   . Diabetes Brother   . Coronary artery disease Brother   . Stroke Brother   . Diabetes Brother   . Diabetes Mother   . Heart failure Mother   . Heart failure Father   . Hypothyroidism Brother   . Coronary artery disease Brother   . Other Brother        colon surgery  . Heart attack Unknown        Nephew  . Irregular heart beat Daughter   . Cancer Maternal Grandmother        unknown     BP 118/78 (BP Location: Left Arm, Patient Position: Sitting, Cuff Size: Large)   Pulse 60   Temp 97.6 F (36.4 C) (Oral)   Wt 189 lb (85.7 kg)   BMI 27.91 kg/m    Review of Systems Denies LOC.     Objective:   Physical Exam VITAL SIGNS:  See vs page GENERAL: no distress Pulses: dorsalis pedis intact bilat.   MSK: no deformity of the feet CV: no leg edema Skin:  no ulcer on the feet.  normal color and temp on the feet. Neuro: sensation is intact to touch on the feet   Lab Results  Component Value Date   HGBA1C 10.7 02/06/2018      Assessment &  Plan:  Insulin-requiring type 2 DM: ongoing poor glycemic control.  We discussed changing to a simpler regimen, but he wants to continue multiple daily injections.   Patient Instructions  check your blood sugar 4 times a day.  vary the time of  day when you check, between before the 3 meals, and at bedtime.  also check if you have symptoms of your blood sugar being too high or too low.  please keep a record of the readings and bring it to your next appointment here (or you can bring the meter itself).  You can write it on any piece of paper.  please call us sooner if your blood sugar goes below 70, or if you have a lot of readings over 200. Please take the insulin numbers listed below.  Please come back for a follow-up appointment in 1 month.
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
## 2018-02-08 ENCOUNTER — Ambulatory Visit: Payer: Medicare PPO | Admitting: Family Medicine

## 2018-02-08 ENCOUNTER — Ambulatory Visit (INDEPENDENT_AMBULATORY_CARE_PROVIDER_SITE_OTHER)
Admission: RE | Admit: 2018-02-08 | Discharge: 2018-02-08 | Disposition: A | Discharge status: Home / Self Care | Payer: Medicare PPO | Source: Ambulatory Visit | Attending: Family Medicine | Admitting: Family Medicine

## 2018-02-08 ENCOUNTER — Other Ambulatory Visit (INDEPENDENT_AMBULATORY_CARE_PROVIDER_SITE_OTHER): Payer: Medicare PPO

## 2018-02-08 VITALS — BP 148/72 | HR 71 | Ht 69.0 in | Wt 189.0 lb

## 2018-02-08 DIAGNOSIS — M5412 Radiculopathy, cervical region: Secondary | ICD-10-CM

## 2018-02-08 DIAGNOSIS — M542 Cervicalgia: Secondary | ICD-10-CM

## 2018-02-08 DIAGNOSIS — M2550 Pain in unspecified joint: Secondary | ICD-10-CM

## 2018-02-08 DIAGNOSIS — M4802 Spinal stenosis, cervical region: Secondary | ICD-10-CM | POA: Diagnosis not present

## 2018-02-08 DIAGNOSIS — M5481 Occipital neuralgia: Secondary | ICD-10-CM | POA: Diagnosis not present

## 2018-02-08 DIAGNOSIS — S199XXA Unspecified injury of neck, initial encounter: Secondary | ICD-10-CM | POA: Diagnosis not present

## 2018-02-08 LAB — COMPREHENSIVE METABOLIC PANEL
ALBUMIN: 4.2 g/dL (ref 3.5–5.2)
ALT: 19 U/L (ref 0–53)
AST: 21 U/L (ref 0–37)
Alkaline Phosphatase: 69 U/L (ref 39–117)
BUN: 8 mg/dL (ref 6–23)
CALCIUM: 9.6 mg/dL (ref 8.4–10.5)
CO2: 28 mEq/L (ref 19–32)
Chloride: 106 mEq/L (ref 96–112)
Creatinine, Ser: 1.2 mg/dL (ref 0.40–1.50)
GFR: 63.73 mL/min (ref >60.00)
Glucose, Bld: 112 mg/dL — ABNORMAL HIGH (ref 70–99)
POTASSIUM: 4.3 meq/L (ref 3.5–5.1)
SODIUM: 142 meq/L (ref 135–145)
Total Bilirubin: 0.5 mg/dL (ref 0.2–1.2)
Total Protein: 7.2 g/dL (ref 6.0–8.3)

## 2018-02-08 LAB — MAGNESIUM: Magnesium: 1.9 mg/dL (ref 1.5–2.5)

## 2018-02-08 LAB — SEDIMENTATION RATE: Sed Rate: 5 mm/hr (ref 0–20)

## 2018-02-08 LAB — VITAMIN D 25 HYDROXY (VIT D DEFICIENCY, FRACTURES): VITD: 50.54 ng/mL (ref 30.00–100.00)

## 2018-02-08 LAB — URIC ACID: Uric Acid, Serum: 5.5 mg/dL (ref 4.0–7.8)

## 2018-02-08 LAB — IBC PANEL
IRON: 54 ug/dL (ref 42–165)
Saturation Ratios: 11.3 % — ABNORMAL LOW (ref 20.0–50.0)
TRANSFERRIN: 340 mg/dL (ref 212.0–360.0)

## 2018-02-08 MED ORDER — GABAPENTIN 100 MG PO CAPS: 200.0000 mg | ORAL_CAPSULE | Freq: Every day | ORAL | 3 refills | Status: DC

## 2018-02-08 MED FILL — GABAPENTIN 100 MG CAPSULE: 100 | 30 days supply | Qty: 60 | Fill #0

## 2018-02-08 NOTE — Assessment & Plan Note (Signed)
I agree that there is occipital neuralgia noted.  I believe some of this is myofascial pain that is causing more of a cervicogenic headache.  Patient has had this pain for quite some time in order to make sure there is no other organic pathology that could be contributing.  Laboratory workup ordered today.  Discussed with patient and possible repeat CT scan of the neck may be necessary.  Mild weakness noted of the C8 distribution.  Discussed with patient about the possibility of repeating the imaging.  Started on gabapentin.  Will hold on the nortriptyline.  Continue all other medications.  Home exercises given and topical anti-inflammatories.  Follow-up again 3-4 weeks.

## 2018-02-08 NOTE — Patient Instructions (Signed)
Good to see you  We will get xray downstairs Lab downstairs Gabapentin 200mg  at night do not do the nortriptyline for now.   We will watch the weakness in the hand If things worsens seek medical attention immediately.  Otherwise see me again in 2 weeks and if not better we will consider CT scan or MRI

## 2018-02-09 LAB — PTH, INTACT AND CALCIUM
CALCIUM: 9.1 mg/dL (ref 8.6–10.3)
PTH: 72 pg/mL — ABNORMAL HIGH (ref 14–64)

## 2018-02-13 ENCOUNTER — Telehealth: Payer: Self-pay | Admitting: Family Medicine

## 2018-02-13 MED FILL — NOVOLOG FLEXPEN SYRINGE: 100 | 38 days supply | Qty: 60 | Fill #0

## 2018-02-13 NOTE — Telephone Encounter (Signed)
FYI- [facility] had patient on the phone trying to rescheduled an appt. Provider does not have any availability this week or next. Patient has swelling in his ankle. I advised patient to be seen this week, other providers offered. Patient states that he will call cardiologist to see if he can be seen for ankle swelling. Patient is rescheduled for a follow up with Charlett Blake on 4/16 @330 .

## 2018-02-14 MED FILL — METOPROLOL SUCCINATE ER 50: 50 | 30 days supply | Qty: 45 | Fill #6

## 2018-02-14 MED FILL — LEVOTHYROXINE 88 MCG TABLET: 88 | 30 days supply | Qty: 30 | Fill #1

## 2018-02-14 MED FILL — PANTOPRAZOLE SOD DR 40 MG T: 40 | 30 days supply | Qty: 30 | Fill #2

## 2018-02-14 MED FILL — ATORVASTATIN 40 MG TABLET: 40 | 30 days supply | Qty: 45 | Fill #2

## 2018-02-14 NOTE — Telephone Encounter (Signed)
Noted  

## 2018-02-16 MED FILL — NovoLIN N 100 UNIT/ML SUSP: 100 | 17 days supply | Qty: 10 | Fill #0

## 2018-02-17 ENCOUNTER — Ambulatory Visit (INDEPENDENT_AMBULATORY_CARE_PROVIDER_SITE_OTHER): Payer: Medicare PPO | Admitting: *Deleted

## 2018-02-17 DIAGNOSIS — I4891 Unspecified atrial fibrillation: Secondary | ICD-10-CM | POA: Diagnosis not present

## 2018-02-17 DIAGNOSIS — Z5181 Encounter for therapeutic drug level monitoring: Secondary | ICD-10-CM

## 2018-02-17 DIAGNOSIS — I480 Paroxysmal atrial fibrillation: Secondary | ICD-10-CM

## 2018-02-17 DIAGNOSIS — Z7901 Long term (current) use of anticoagulants: Secondary | ICD-10-CM

## 2018-02-17 LAB — POCT INR: INR: 3.1

## 2018-02-17 NOTE — Patient Instructions (Signed)
Description   Today take 1 tablet then continue on same dosage 1 tablet everyday except 1.5 tablets on Mondays and Fridays.  Recheck in 4 weeks. Call us with any medication changes [phone] [facility]. Main # [phone].

## 2018-02-21 NOTE — Progress Notes (Signed)
[name] [place], [place] [address] Phone: [phone] Subjective:    CC: Neck pain  [id number]  [name] [name] is a 72 y.o. male coming in with complaint of neck pain. His headaches have decreased. Patient states that he has pain when he lies his head down on a pillow. He has been taking gabapentin and believes that it is causing swelling.  Patient does state that he unfortunately continues to have some low amount of pain in the neck and unfortunately also does have the weakness that is new since the accident.  Patient states this is somewhat concerning.  Does have a past medical history significant for fusion of the neck.  Patient had new x-rays taken February 08, 2018 that were independently visualized by me showing no significant instability of the neck.  Does have diffuse degenerative facet disease.      Past Medical History:  Diagnosis Date  . ACP (advance care planning) 05/11/2015  . Advance care planning 05/01/2015  . Anemia    hemoglobin 7.4, iron deficiency, January, 2011, 2 unit transfusion, endoscopy normal, capsule endoscopy February, 2011 no small bowel abnormalities.   Most likely source gastric erosions, followed by GI  . Anxiety   . CAD (coronary artery disease)    A. CABG in 2000,status post cardiac cath in 2006, 2009 ....continued chest pain and SOB despite oral medication adjestments including Ranexa. B. Cath November 2009/ mRCA - 2.75 x 23 Abbott Xience V drug-eluting stent ...11/26/2008 to distal  RCA leading to acute marginal.  C. Cath 07/2012 for CP - stable anatomy, med rx. d. cath 2015 and 05/30/2015 stable anatomy, consider Myoview if has CP again  . Cerebral ischemia    MRI November, 2010, chronic microvascular ischemia  . Chronic diastolic CHF (congestive heart failure) ([place]) 03/13/2016  . Concussion   . Depression    Bipolar  . Diabetes mellitus type II, uncontrolled ([place])   . Diabetes mellitus without complication  ([place])   . Diarrhea 02/15/2016  . Dizziness   . Edema   . Ejection fraction    EF 60%, echo, July 31, 2012  . Falling episodes    these have occurred in the past and again recurring 2011  . Family history of adverse reaction to anesthesia    "mother died during bypass surgery but not sure if it has to do with anesthesia"  . Gastric ulcer   . GERD (gastroesophageal reflux disease)   . H/O medication noncompliance    Due to loss of insurance  . Hard of hearing   . Headache 01/25/2016  . Hx of CABG    2000,  / one median sternotomy suture broken her chest x-ray November, 2010, no clinical significance  . Hyperlipidemia   . Hypertension    pt. denies  . Iron deficiency anemia   . Low back pain 06/12/2009   Qualifier: Diagnosis of  By: [name]   . Nausea with vomiting 01/25/2016  . Nephrolithiasis   . OSA (obstructive sleep apnea)   . Pain in limb 06/12/2009   Qualifier: Diagnosis of  By: [name]   . Palpitations    event recorder showed sinus rhythm  . RBBB (right bundle branch block)   . Restless leg   . Right knee pain 01/07/2015  . Shortness of breath    CPX April, 2011, mild functional limitation, no clear pulmonary or cardiac limitation, possible deconditioning and mild chronotropic incompetence( peak  heart rate 130)  . Spondylosis    C5-6, C6-7 MRI 2010  . Syncope 03/2016  . Thyroid disease   . Tubulovillous adenoma of colon 2007  . Vitamin D deficiency 05/17/2017  . Wears glasses    Past Surgical History:  Procedure Laterality Date  . ANTERIOR CERVICAL DECOMP/DISCECTOMY FUSION N/A 05/31/2016   Procedure: ANTERIOR CERVICAL DECOMPRESSION/DISCECTOMY FUSION CERVICAL FIVE-SIX,CERVICAL SIX-SEVEN;  Surgeon: [name], MD;  Location: [facility];  Service: Neurosurgery;  Laterality: N/A;  . CARDIAC CATHETERIZATION N/A 05/30/2015   Procedure: Left Heart Cath and Coronary Angiography;  Surgeon: [name], MD;  Location: [facility];  Service:  Cardiovascular;  Laterality: N/A;  . COLONOSCOPY    . CORONARY ARTERY BYPASS GRAFT     2000  . ESOPHAGOGASTRODUODENOSCOPY    . LEFT HEART CATH AND CORS/GRAFTS ANGIOGRAPHY N/A 12/15/2017   Procedure: LEFT HEART CATH AND CORS/GRAFTS ANGIOGRAPHY;  Surgeon: [name], MD;  Location: [facility];  Service: Cardiovascular;  Laterality: N/A;  . LEFT HEART CATHETERIZATION WITH CORONARY/GRAFT ANGIOGRAM N/A 08/01/2012   Procedure: LEFT HEART CATHETERIZATION WITH Beatrix Fetters;  Surgeon: [name], MD;  Location: [facility];  Service: Cardiovascular;  Laterality: N/A;  . LEFT HEART CATHETERIZATION WITH CORONARY/GRAFT ANGIOGRAM N/A 01/03/2015   Procedure: LEFT HEART CATHETERIZATION WITH Beatrix Fetters;  Surgeon: [name], MD;  Location: [facility] CATH LAB;  Service: Cardiovascular;  Laterality: N/A;  . NASAL SEPTUM SURGERY     UP3  . PERCUTANEOUS CORONARY STENT INTERVENTION (PCI-S)  10/2008   mRCA PCI  2.75 x 23 Abbott Xience V drug-eluting stent   . ULTRASOUND GUIDANCE FOR VASCULAR ACCESS  12/15/2017   Procedure: Ultrasound Guidance For Vascular Access;  Surgeon: [name], MD;  Location: [facility];  Service: Cardiovascular;;   Social History   Socioeconomic History  . Marital status: Married    Spouse name: None  . Number of children: 4  . Years of education: 64  . Highest education level: None  Social Needs  . Financial resource strain: None  . Food insecurity - worry: None  . Food insecurity - inability: None  . Transportation needs - medical: None  . Transportation needs - non-medical: None  Occupational History  . Occupation: retired  Tobacco Use  . Smoking status: Never Smoker  . Smokeless tobacco: Never Used  Substance and Sexual Activity  . Alcohol use: No    Alcohol/week: 0.0 oz    Comment: stopped drinking in 1998  . Drug use: No  . Sexual activity: None  Other Topics Concern  . None  Social History Narrative     Patient is right handed.   Patient does not drink caffeine.   Allergies  Allergen Reactions  . Morphine Other (See Comments)    hallucinations  . Shellfish-Derived Products Itching   Family History  Problem Relation Age of Onset  . Pancreatic cancer Brother   . Diabetes Brother   . Coronary artery disease Brother   . Stroke Brother   . Diabetes Brother   . Diabetes Mother   . Heart failure Mother   . Heart failure Father   . Hypothyroidism Brother   . Coronary artery disease Brother   . Other Brother        colon surgery  . Heart attack Unknown        Nephew  . Irregular heart beat Daughter   . Cancer Maternal Grandmother  unknown      Past medical history, social, surgical and family history all reviewed in electronic medical record.  No pertanent information unless stated regarding to the chief complaint.   Review of Systems:Review of systems updated and as accurate as of 02/22/18  No visual changes, nausea, vomiting, diarrhea, constipation, dizziness, abdominal pain, skin rash, fevers, chills, night sweats, weight loss, swollen lymph nodes, body aches, joint swelling, chest pain, shortness of breath, mood changes.  Positive headaches, muscle aches  Objective  Blood pressure 126/82, pulse 65, height 5\' 9"  (1.753 m), weight 192 lb (87.1 kg), SpO2 98 %. Systems examined below as of 02/22/18   General: No apparent distress alert and oriented x3 mood and affect normal, dressed appropriately.  HEENT: Pupils equal, extraocular movements intact  Respiratory: Patient's speak in full sentences and does not appear short of breath  Cardiovascular: No lower extremity edema, non tender, no erythema  Skin: Warm dry intact with no signs of infection or rash on extremities or on axial skeleton.  Abdomen: Soft nontender  Neuro: Cranial nerves II through XII are intact, neurovascularly intact in Non all extremities with 2+ DTRs and 2+ pulses.  Lymph: No lymphadenopathy of  posterior or anterior cervical chain or axillae bilaterally.  Gait normal with good balance and coordination.  MSK: Mild tender with full range of motion and good stability and symmetric strength and tone of shoulders, elbows, wrist, hip, knee and ankles bilaterally.   Mild to moderate arthritic changes.  Neck: Inspection mild loss of lordosis. No palpable stepoffs. Positive Spurling's maneuver. Significant weakness since the left side.  Mostly in the C8 distribution.       Impression and Recommendations:     This case required medical decision making of moderate complexity.      Note: This dictation was prepared with Dragon dictation along with smaller phrase technology. Any transcriptional errors that result from this process are unintentional.

## 2018-02-22 ENCOUNTER — Ambulatory Visit (INDEPENDENT_AMBULATORY_CARE_PROVIDER_SITE_OTHER): Payer: Medicare PPO | Admitting: Family Medicine

## 2018-02-22 ENCOUNTER — Encounter: Payer: Self-pay | Admitting: Family Medicine

## 2018-02-22 VITALS — BP 126/82 | HR 65 | Ht 69.0 in | Wt 192.0 lb

## 2018-02-22 DIAGNOSIS — M9981 Other biomechanical lesions of cervical region: Secondary | ICD-10-CM | POA: Diagnosis not present

## 2018-02-22 DIAGNOSIS — M4802 Spinal stenosis, cervical region: Secondary | ICD-10-CM

## 2018-02-22 DIAGNOSIS — M5412 Radiculopathy, cervical region: Secondary | ICD-10-CM

## 2018-02-22 NOTE — Patient Instructions (Addendum)
I am going to get a CT myelogram of your neck. I think with the surgery this will tell me more.  For the knee we will do some other exercises  Drop the gabapentin 100mg  at night After you know when the CT is I want to see you again 1-2 days after to discuss.

## 2018-02-22 NOTE — Assessment & Plan Note (Signed)
Patient does have significant weakness in the C8 distribution.  Patient as well as daughter states that this was not occurring before the accident.  Patient is taking now gabapentin but not religiously.  I do feel with history of cervical fusion adjacent segment disease and worsening symptoms could be contributing.  CT myelogram ordered today for further evaluation.  Patient is on a blood thinner which may complicate the possibility of doing the injection but patient still would like to know his options.  Follow-up after the imaging to further discuss

## 2018-02-27 ENCOUNTER — Ambulatory Visit (INDEPENDENT_AMBULATORY_CARE_PROVIDER_SITE_OTHER): Payer: Medicare PPO | Admitting: Family Medicine

## 2018-02-27 DIAGNOSIS — I10 Essential (primary) hypertension: Secondary | ICD-10-CM

## 2018-03-01 ENCOUNTER — Telehealth: Payer: Self-pay

## 2018-03-01 NOTE — Telephone Encounter (Signed)
   [facility] Pre-operative Risk Assessment    Request for surgical clearance:  1. What type of surgery is being performed? Myelogram   2. When is this surgery scheduled? Pending    3. What type of clearance is required (medical clearance vs. Pharmacy clearance to hold med vs. Both)? Pharmacy  4. Are there any medications that need to be held prior to surgery and how long? Coumadin, 4 days    5. Practice name and name of physician performing surgery? [place], Not Listed    6. What is your office phone and fax number? [phone], fax: [phone]   7. Anesthesia type (None, local, MAC, general) ? Not Listed    Joaquim Lai 03/01/2018, 5:01 PM  _________________________________________________________________   (provider comments below)

## 2018-03-01 NOTE — Progress Notes (Addendum)
Phone call to patient to review allergies and medication list. Pt made aware he will need to stop coumadin 4 days prior to myelogram procedure (pending cardiologist approval) and zoloft 48 hrs prior to myelogram procedure. Pt verbalized understanding.  Patient was not seen

## 2018-03-03 ENCOUNTER — Telehealth: Payer: Self-pay | Admitting: Cardiology

## 2018-03-03 NOTE — Telephone Encounter (Signed)
Called patient- unable to leave message, will need to try again.  Kerin Ransom PA-C 03/03/2018 2:40 PM

## 2018-03-03 NOTE — Telephone Encounter (Signed)
Pt called- unable to leave a message, he will need to be called again.  Kerin Ransom PA-C 03/03/2018 2:42 PM

## 2018-03-06 ENCOUNTER — Ambulatory Visit: Payer: Medicare PPO | Admitting: Endocrinology

## 2018-03-06 NOTE — Telephone Encounter (Signed)
Clinical pharmacist to review how long to hold coumadin.

## 2018-03-07 NOTE — Telephone Encounter (Signed)
Pt takes warfarin for afib with CHADS2VASc score of 5 (age, CAD, HTN, CHF, DM). Recommend holding warfarin for 5 days prior to myelogram. Patient does NOT require any Lovenox bridging.

## 2018-03-08 ENCOUNTER — Other Ambulatory Visit: Payer: Self-pay | Admitting: Neurology

## 2018-03-08 ENCOUNTER — Other Ambulatory Visit: Payer: Self-pay | Admitting: Family Medicine

## 2018-03-08 MED FILL — DIVALPROEX SOD ER 500 MG TA: 500 | 30 days supply | Qty: 30 | Fill #0

## 2018-03-08 MED FILL — VIT D2 1.25 MG (50,000 UNIT: 1.25 MG | 84 days supply | Qty: 12 | Fill #1

## 2018-03-08 MED FILL — NovoLIN N 100 UNIT/ML SUSP: 100 | 17 days supply | Qty: 10 | Fill #1

## 2018-03-08 MED FILL — MIDODRINE HCL 10 MG TABLET: 10 | 30 days supply | Qty: 90 | Fill #7

## 2018-03-09 ENCOUNTER — Ambulatory Visit (INDEPENDENT_AMBULATORY_CARE_PROVIDER_SITE_OTHER): Payer: Medicare PPO | Admitting: Endocrinology

## 2018-03-09 ENCOUNTER — Encounter: Payer: Self-pay | Admitting: Endocrinology

## 2018-03-09 VITALS — BP 130/80 | HR 61 | Temp 97.8°F | Ht 69.0 in | Wt 194.8 lb

## 2018-03-09 DIAGNOSIS — E1165 Type 2 diabetes mellitus with hyperglycemia: Secondary | ICD-10-CM | POA: Diagnosis not present

## 2018-03-09 LAB — POCT GLYCOSYLATED HEMOGLOBIN (HGB A1C): Hemoglobin A1C: 9.1

## 2018-03-09 MED ORDER — INSULIN NPH (HUMAN) (ISOPHANE) 100 UNIT/ML ~~LOC~~ SUSP: 70.0000 [IU] | Freq: Every day | SUBCUTANEOUS | 3 refills | Status: DC

## 2018-03-09 MED ORDER — INSULIN ASPART 100 UNIT/ML FLEXPEN: PEN_INJECTOR | SUBCUTANEOUS | 11 refills | Status: DC

## 2018-03-09 MED FILL — PRAMIPEXOLE 1 MG TABLET: 1 | 30 days supply | Qty: 30 | Fill #0

## 2018-03-09 MED FILL — ISOSORBIDE MN ER 60 MG TAB: 60 | 90 days supply | Qty: 180 | Fill #0

## 2018-03-09 NOTE — Telephone Encounter (Signed)
Follow up    Patient is returning call in reference to pre-op       [name] Pre-operative Risk Assessment    Request for surgical clearance:  1. What type of surgery is being performed? Myelogram  2. When is this surgery scheduled? TBD  3. What type of clearance is required (medical clearance vs. Pharmacy clearance to hold med vs. Both)? Pharmacy  4. Are there any medications that need to be held prior to surgery and how long? Coumadin 4 days  5. Practice name and name of physician performing surgery?  [facility], Not Listed   6. What is your office phone and fax number? Office [phone], Fax [phone]  7. Anesthesia type (None, local, MAC, general) ? Not listed   [name] [name] 03/09/2018, 11:19 AM  _________________________________________________________________   (provider comments below)

## 2018-03-09 NOTE — Telephone Encounter (Signed)
Contacted patient, appointment scheduled with Dr Irish Lack; patient voiced understanding.

## 2018-03-09 NOTE — Telephone Encounter (Signed)
I spoke with Mr Engram- he is having chest pain. He'll need an office visit before clearance is granted. Will forward to Pre op call back.   Kerin Ransom PA-C 03/09/2018 3:48 PM

## 2018-03-09 NOTE — Telephone Encounter (Signed)
Called and left message to call back. He has known CAD and was having some chest pain at his LOV in Feb.  Kerin Ransom PA-C 03/09/2018 8:50 AM

## 2018-03-09 NOTE — Patient Instructions (Addendum)
check your blood sugar 4 times a day.  vary the time of day when you check, between before the 3 meals, and at bedtime.  also check if you have symptoms of your blood sugar being too high or too low.  please keep a record of the readings and bring it to your next appointment here (or you can bring the meter itself).  You can write it on any piece of paper.  please call us sooner if your blood sugar goes below 70, or if you have a lot of readings over 200. Please take the insulin numbers listed below.  Please come back for a follow-up appointment in 2 months.

## 2018-03-09 NOTE — Progress Notes (Signed)
Subjective:    Patient ID: [name] [name], male    DOB: March 04, 1946, 72 y.o.   MRN: [id number]  HPI Pt returns for f/u of diabetes mellitus: DM type: Insulin-requiring type 2, although lean body habitus raises the possibility he is evolving type 1 Dx'ed: [id number] Complications: polyneuropathy, renal insuff, CAD and PAD Therapy: insulin since 2008 DKA: never Severe hypoglycemia: never Pancreatitis: never Pancreatic imaging: 2017 CT showed fatty replacement of the pancreatic head and body. Other: he takes multiple daily injections; he takes human insulin, due to cost. Interval history: He brings a record of his cbg's which I have reviewed today.  It is mildly low approx twice per month.  This happens when he takes novolog, but does not eat.  He says he never misses the insulin.   Past Medical History:  Diagnosis Date  . ACP (advance care planning) 05/11/2015  . Advance care planning 05/01/2015  . Anemia    hemoglobin 7.4, iron deficiency, January, 2011, 2 unit transfusion, endoscopy normal, capsule endoscopy February, 2011 no small bowel abnormalities.   Most likely source gastric erosions, followed by GI  . Anxiety   . CAD (coronary artery disease)    A. CABG in 2000,status post cardiac cath in 2006, 2009 ....continued chest pain and SOB despite oral medication adjestments including Ranexa. B. Cath November 2009/ mRCA - 2.75 x 23 Abbott Xience V drug-eluting stent ...11/26/2008 to distal  RCA leading to acute marginal.  C. Cath 07/2012 for CP - stable anatomy, med rx. d. cath 2015 and 05/30/2015 stable anatomy, consider Myoview if has CP again  . Cerebral ischemia    MRI November, 2010, chronic microvascular ischemia  . Chronic diastolic CHF (congestive heart failure) ([place]) 03/13/2016  . Concussion   . Depression    Bipolar  . Diabetes mellitus type II, uncontrolled ([place])   . Diabetes mellitus without complication ([place])   . Diarrhea 02/15/2016  . Dizziness   . Edema   . Ejection fraction      EF 60%, echo, July 31, 2012  . Falling episodes    these have occurred in the past and again recurring 2011  . Family history of adverse reaction to anesthesia    "mother died during bypass surgery but not sure if it has to do with anesthesia"  . Gastric ulcer   . GERD (gastroesophageal reflux disease)   . H/O medication noncompliance    Due to loss of insurance  . Hard of hearing   . Headache 01/25/2016  . Hx of CABG    2000,  / one median sternotomy suture broken her chest x-ray November, 2010, no clinical significance  . Hyperlipidemia   . Hypertension    pt. denies  . Iron deficiency anemia   . Low back pain 06/12/2009   Qualifier: Diagnosis of  By: [name]   . Nausea with vomiting 01/25/2016  . Nephrolithiasis   . OSA (obstructive sleep apnea)   . Pain in limb 06/12/2009   Qualifier: Diagnosis of  By: [name]   . Palpitations    event recorder showed sinus rhythm  . RBBB (right bundle branch block)   . Restless leg   . Right knee pain 01/07/2015  . Shortness of breath    CPX April, 2011, mild functional limitation, no clear pulmonary or cardiac limitation, possible deconditioning and mild chronotropic incompetence( peak heart rate 130)  . Spondylosis    C5-6, C6-7 MRI 2010  . Syncope 03/2016  .  Thyroid disease   . Tubulovillous adenoma of colon 2007  . Vitamin D deficiency 05/17/2017  . Wears glasses     Past Surgical History:  Procedure Laterality Date  . ANTERIOR CERVICAL DECOMP/DISCECTOMY FUSION N/A 05/31/2016   Procedure: ANTERIOR CERVICAL DECOMPRESSION/DISCECTOMY FUSION CERVICAL FIVE-SIX,CERVICAL SIX-SEVEN;  Surgeon: [name], MD;  Location: [place] [facility];  Service: Neurosurgery;  Laterality: N/A;  . CARDIAC CATHETERIZATION N/A 05/30/2015   Procedure: Left Heart Cath and Coronary Angiography;  Surgeon: [name], MD;  Location: [place];  Service: Cardiovascular;  Laterality: N/A;  . COLONOSCOPY    . CORONARY ARTERY BYPASS GRAFT      2000  . ESOPHAGOGASTRODUODENOSCOPY    . LEFT HEART CATH AND CORS/GRAFTS ANGIOGRAPHY N/A 12/15/2017   Procedure: LEFT HEART CATH AND CORS/GRAFTS ANGIOGRAPHY;  Surgeon: [name], MD;  Location: [place] [facility];  Service: Cardiovascular;  Laterality: N/A;  . LEFT HEART CATHETERIZATION WITH CORONARY/GRAFT ANGIOGRAM N/A 08/01/2012   Procedure: LEFT HEART CATHETERIZATION WITH Beatrix Fetters;  Surgeon: [name], MD;  Location: [facility] CATH LAB;  Service: Cardiovascular;  Laterality: N/A;  . LEFT HEART CATHETERIZATION WITH CORONARY/GRAFT ANGIOGRAM N/A 01/03/2015   Procedure: LEFT HEART CATHETERIZATION WITH Beatrix Fetters;  Surgeon: [name], MD;  Location: [facility] CATH LAB;  Service: Cardiovascular;  Laterality: N/A;  . NASAL SEPTUM SURGERY     UP3  . PERCUTANEOUS CORONARY STENT INTERVENTION (PCI-S)  10/2008   mRCA PCI  2.75 x 23 Abbott Xience V drug-eluting stent   . ULTRASOUND GUIDANCE FOR VASCULAR ACCESS  12/15/2017   Procedure: Ultrasound Guidance For Vascular Access;  Surgeon: [name], MD;  Location: [place] CV LAB;  Service: Cardiovascular;;    Social History   Socioeconomic History  . Marital status: Married    Spouse name: Not on file  . Number of children: 4  . Years of education: 54  . Highest education level: Not on file  Occupational History  . Occupation: retired  Scientific laboratory technician  . Financial resource strain: Not on file  . Food insecurity:    Worry: Not on file    Inability: Not on file  . Transportation needs:    Medical: Not on file    Non-medical: Not on file  Tobacco Use  . Smoking status: Never Smoker  . Smokeless tobacco: Never Used  Substance and Sexual Activity  . Alcohol use: No    Alcohol/week: 0.0 oz    Comment: stopped drinking in 1998  . Drug use: No  . Sexual activity: Not on file  Lifestyle  . Physical activity:    Days per week: Not on file    Minutes per session: Not on file  . Stress: Not on  file  Relationships  . Social connections:    Talks on phone: Not on file    Gets together: Not on file    Attends religious service: Not on file    Active member of club or organization: Not on file    Attends meetings of clubs or organizations: Not on file    Relationship status: Not on file  . Intimate partner violence:    Fear of current or ex partner: Not on file    Emotionally abused: Not on file    Physically abused: Not on file    Forced sexual activity: Not on file  Other Topics Concern  . Not on file  Social History Narrative   Patient is right handed.   Patient  does not drink caffeine.    Current Outpatient Medications on File Prior to Visit  Medication Sig Dispense Refill  . amiodarone (PACERONE) 400 MG tablet Take 200 mg by mouth daily.    Marland Kitchen atorvastatin (LIPITOR) 40 MG tablet TAKE 1 & 1/2 TABLETS (60 MG TOTAL) BY MOUTH DAILY. 45 tablet 3  . diclofenac sodium (VOLTAREN) 1 % GEL Apply 2 g topically daily as needed (for pain in affected area).    . divalproex (DEPAKOTE ER) 500 MG 24 hr tablet TAKE 1 TABLET (500 MG TOTAL) BY MOUTH DAILY. 30 tablet 2  . gabapentin (NEURONTIN) 100 MG capsule Take 2 capsules (200 mg total) by mouth at bedtime. 60 capsule 3  . glucose blood (ACCU-CHEK AVIVA) test strip Use as directed to check blood sugar 4 times daily.  DX E11.9 200 each 6  . isosorbide mononitrate (IMDUR) 60 MG 24 hr tablet TAKE TWO TABLETS BY MOUTH DAILY 180 tablet 0  . levothyroxine (SYNTHROID, LEVOTHROID) 88 MCG tablet TAKE 1 TABLET (88 MCG TOTAL) BY MOUTH DAILY BEFORE BREAKFAST. 30 tablet 6  . loperamide (IMODIUM A-D) 2 MG tablet Take 1 tablet (2 mg total) by mouth 4 (four) times daily as needed for diarrhea or loose stools. 30 tablet 0  . metoprolol succinate (TOPROL-XL) 50 MG 24 hr tablet TAKE 1&1/2 TABLETS BY MOUTH ONCE A DAY FOR A TOTAL OF 75 MG. 45 tablet 6  . midodrine (PROAMATINE) 10 MG tablet TAKE 1 TABLET (10 MG TOTAL) BY MOUTH 3 TIMES DAILY. 90 tablet 9  .  nitroGLYCERIN (NITROSTAT) 0.4 MG SL tablet Place 1 tablet (0.4 mg total) under the tongue every 5 (five) minutes as needed for chest pain. 25 tablet 1  . nortriptyline (PAMELOR) 10 MG capsule Take 1 capsule (10 mg total) by mouth at bedtime. 30 capsule 2  . ondansetron (ZOFRAN ODT) 4 MG disintegrating tablet Take 1 tablet (4 mg total) by mouth every 8 (eight) hours as needed for nausea or vomiting. 10 tablet 1  . pantoprazole (PROTONIX) 40 MG tablet TAKE 1 TABLET BY MOUTH TWICE DAILY FOR 2 WEEKS THEN DROP TO ONCE DAILY (Patient taking differently: 1 t po qd) 45 tablet 1  . pramipexole (MIRAPEX) 1 MG tablet TAKE 1/2 TO 1 TABLET BY MOUTH AT BEDTIME 30 tablet 2  . ranolazine (RANEXA) 500 MG 12 hr tablet Take 1 tablet (500 mg total) by mouth 2 (two) times daily. 14 tablet 0  . sertraline (ZOLOFT) 100 MG tablet TAKE 1 TABLET (100 MG TOTAL) BY MOUTH 2 (TWO) TIMES DAILY. 180 tablet 1  . Vitamin D, Ergocalciferol, (DRISDOL) 50000 units CAPS capsule TAKE 1 CAPSULE (50,000 UNITS TOTAL) BY MOUTH EVERY 7 (SEVEN) DAYS. 12 capsule 2  . warfarin (COUMADIN) 5 MG tablet TAKE AS DIRECTED BY COUMADIN CLINIC 45 tablet 3   No current facility-administered medications on file prior to visit.     Allergies  Allergen Reactions  . Morphine Other (See Comments)    hallucinations  . Shellfish-Derived Products Itching    Family History  Problem Relation Age of Onset  . Pancreatic cancer Brother   . Diabetes Brother   . Coronary artery disease Brother   . Stroke Brother   . Diabetes Brother   . Diabetes Mother   . Heart failure Mother   . Heart failure Father   . Hypothyroidism Brother   . Coronary artery disease Brother   . Other Brother        colon surgery  . Heart attack  Unknown        Nephew  . Irregular heart beat Daughter   . Cancer Maternal Grandmother        unknown     BP 130/80 (BP Location: Right Arm, Patient Position: Sitting, Cuff Size: Normal)   Pulse 61   Temp 97.8 F (36.6 C) (Oral)    Ht 5\' 9"  (1.753 m)   Wt 194 lb 12.8 oz (88.4 kg)   SpO2 96%   BMI 28.77 kg/m   Review of Systems Denies LOC.      Objective:   Physical Exam VITAL SIGNS:  See vs page GENERAL: no distress Pulses: foot pulses are intact bilaterally.   MSK: no deformity of the feet or ankles.  CV: no edema of the legs or ankles Skin:  no ulcer on the feet or ankles.  normal color and temp on the feet and ankles Neuro: sensation is intact to touch on the feet and ankles.     Lab Results  Component Value Date   HGBA1C 9.1 03/09/2018       Assessment & Plan:  Insulin-requiring type 2 DM, with renal insuff: ongoing poor glycemic control.  We discussed.  He declines to d/c multiple daily injections.    Patient Instructions  check your blood sugar 4 times a day.  vary the time of day when you check, between before the 3 meals, and at bedtime.  also check if you have symptoms of your blood sugar being too high or too low.  please keep a record of the readings and bring it to your next appointment here (or you can bring the meter itself).  You can write it on any piece of paper.  please call us sooner if your blood sugar goes below 70, or if you have a lot of readings over 200. Please take the insulin numbers listed below.  Please come back for a follow-up appointment in 2 months.

## 2018-03-10 ENCOUNTER — Telehealth: Payer: Self-pay | Admitting: Interventional Cardiology

## 2018-03-10 NOTE — Telephone Encounter (Signed)
Mr.Fournet is calling because he received a call from  [facility] saying that they are cancelling his procedure because he is on Warfarin . He is supposed to stop taking it on 03/15/18 for 5 days prior . He is wanting to know if he needs to come in to see [name] before the surgery . Please call

## 2018-03-10 NOTE — Telephone Encounter (Signed)
No further cardiac testing or appointment needed before surgery, given recent cardiac cath.

## 2018-03-10 NOTE — Telephone Encounter (Signed)
Called and spoke to patient. Patient states that he always has chest pain and that this has not changed or worsened. Patient takes Ranexa 500 mg BID and Imdur 120 mg QD. Patient denies NTG use. Recent cath 12/15/17 with patent grafts. Will discuss with [name] on whether or not patient requires appointment or further testing for clearance.

## 2018-03-10 NOTE — Telephone Encounter (Signed)
Called and spoke to patient. Patient states that he always has chest pain and that this has not changed or worsened. Patient takes Ranexa 500 mg BID and Imdur 120 mg QD. Patient denies NTG use. Recent cath 12/15/17 with patent grafts. Will forward to [name] to comment on whether patient requires appointment or further testing for clearance.

## 2018-03-10 NOTE — Telephone Encounter (Signed)
Clearance faxed to [facility] via [place].

## 2018-03-10 NOTE — Telephone Encounter (Signed)
Called and made patient aware that [name] has cleared him for his myelogram scheduled on 03/21/18. Made patient aware that he does not need his appointment with [name] scheduled on 03/16/18 and no further testing is required. Appointment cancelled.   Patient has already been cleared to hold his coumadin 5 days prior to his procedure. Discussed with Georgina Peer [facility] and patient's last dose of coumadin will be 03/15/18 and he will resume at his usual dose after the procedure when they say that it is safe to restart. Per Georgina Peer, [facility] patient does not need coumadin appointment scheduled on 03/17/18 but will need an appointment 2 weeks after procedure. Appointment re-scheduled for 04/03/18 at 10:00 AM.  Made patient aware that the clearance was faxed to [facility]. Patient very appreciative and thanked me for the call.

## 2018-03-16 ENCOUNTER — Ambulatory Visit: Payer: Medicare PPO | Admitting: Interventional Cardiology

## 2018-03-16 NOTE — Progress Notes (Deleted)
Subjective:   [name] [name] is a 72 y.o. male who presents for Medicare Annual/Subsequent preventive examination.  Review of Systems: No ROS.  Medicare Wellness Visit. Additional risk factors are reflected in the social history.    Sleep patterns: Home Safety/Smoke Alarms: Feels safe in home. Smoke alarms in place.  Living environment; residence and Firearm Safety:  [place] Safety/Bike Helmet: Wears seat belt.  Male:   CCS-     PSA-  Lab Results  Component Value Date   PSA 2.39 11/25/2010   PSA 1.48 04/05/2008   PSA 1.60 03/14/2007       Objective:    Vitals: There were no vitals taken for this visit.  There is no height or weight on file to calculate BMI.  Advanced Directives 12/15/2017 12/11/2017 08/31/2017 04/21/2017 03/17/2017 02/13/2017 12/24/2016  Does Patient Have a Medical Advance Directive? No No No No Yes No Yes  Type of Advance Directive - - - - - - -  Does patient want to make changes to medical advance directive? - - - - Yes (MAU/Ambulatory/Procedural Areas - Information given) - -  Copy of [place] in Chart? - - - - - - -  Would patient like information on creating a medical advance directive? No - Patient declined - - No - Patient declined - - -  Pre-existing out of facility DNR order (yellow form or pink MOST form) - - - - - - -    Tobacco Social History   Tobacco Use  Smoking Status Never Smoker  Smokeless Tobacco Never Used     Counseling given: Not Answered   Clinical Intake:                       Past Medical History:  Diagnosis Date  . ACP (advance care planning) 05/11/2015  . Advance care planning 05/01/2015  . Anemia    hemoglobin 7.4, iron deficiency, January, 2011, 2 unit transfusion, endoscopy normal, capsule endoscopy February, 2011 no small bowel abnormalities.   Most likely source gastric erosions, followed by GI  . Anxiety   . CAD (coronary artery disease)    A. CABG in 2000,status post cardiac cath  in 2006, 2009 ....continued chest pain and SOB despite oral medication adjestments including Ranexa. B. Cath November 2009/ mRCA - 2.75 x 23 Abbott Xience V drug-eluting stent ...11/26/2008 to distal  RCA leading to acute marginal.  C. Cath 07/2012 for CP - stable anatomy, med rx. d. cath 2015 and 05/30/2015 stable anatomy, consider Myoview if has CP again  . Cerebral ischemia    MRI November, 2010, chronic microvascular ischemia  . Chronic diastolic CHF (congestive heart failure) ([place]) 03/13/2016  . Concussion   . Depression    Bipolar  . Diabetes mellitus type II, uncontrolled ([place])   . Diabetes mellitus without complication ([place])   . Diarrhea 02/15/2016  . Dizziness   . Edema   . Ejection fraction    EF 60%, echo, July 31, 2012  . Falling episodes    these have occurred in the past and again recurring 2011  . Family history of adverse reaction to anesthesia    "mother died during bypass surgery but not sure if it has to do with anesthesia"  . Gastric ulcer   . GERD (gastroesophageal reflux disease)   . H/O medication noncompliance    Due to loss of insurance  . Hard of hearing   . Headache 01/25/2016  . Hx  of CABG    2000,  / one median sternotomy suture broken her chest x-ray November, 2010, no clinical significance  . Hyperlipidemia   . Hypertension    pt. denies  . Iron deficiency anemia   . Low back pain 06/12/2009   Qualifier: Diagnosis of  By: [name]   . Nausea with vomiting 01/25/2016  . Nephrolithiasis   . OSA (obstructive sleep apnea)   . Pain in limb 06/12/2009   Qualifier: Diagnosis of  By: [name]   . Palpitations    event recorder showed sinus rhythm  . RBBB (right bundle branch block)   . Restless leg   . Right knee pain 01/07/2015  . Shortness of breath    CPX April, 2011, mild functional limitation, no clear pulmonary or cardiac limitation, possible deconditioning and mild chronotropic incompetence( peak heart rate 130)  . Spondylosis    C5-6,  C6-7 MRI 2010  . Syncope 03/2016  . Thyroid disease   . Tubulovillous adenoma of colon 2007  . Vitamin D deficiency 05/17/2017  . Wears glasses    Past Surgical History:  Procedure Laterality Date  . ANTERIOR CERVICAL DECOMP/DISCECTOMY FUSION N/A 05/31/2016   Procedure: ANTERIOR CERVICAL DECOMPRESSION/DISCECTOMY FUSION CERVICAL FIVE-SIX,CERVICAL SIX-SEVEN;  Surgeon: [name], MD;  Location: [place] [facility];  Service: Neurosurgery;  Laterality: N/A;  . CARDIAC CATHETERIZATION N/A 05/30/2015   Procedure: Left Heart Cath and Coronary Angiography;  Surgeon: [name], MD;  Location: [place] CV LAB;  Service: Cardiovascular;  Laterality: N/A;  . COLONOSCOPY    . CORONARY ARTERY BYPASS GRAFT     2000  . ESOPHAGOGASTRODUODENOSCOPY    . LEFT HEART CATH AND CORS/GRAFTS ANGIOGRAPHY N/A 12/15/2017   Procedure: LEFT HEART CATH AND CORS/GRAFTS ANGIOGRAPHY;  Surgeon: [name], MD;  Location: [facility] CV LAB;  Service: Cardiovascular;  Laterality: N/A;  . LEFT HEART CATHETERIZATION WITH CORONARY/GRAFT ANGIOGRAM N/A 08/01/2012   Procedure: LEFT HEART CATHETERIZATION WITH Beatrix Fetters;  Surgeon: [name], MD;  Location: [facility] LAB;  Service: Cardiovascular;  Laterality: N/A;  . LEFT HEART CATHETERIZATION WITH CORONARY/GRAFT ANGIOGRAM N/A 01/03/2015   Procedure: LEFT HEART CATHETERIZATION WITH Beatrix Fetters;  Surgeon: [name], MD;  Location: [facility] CATH LAB;  Service: Cardiovascular;  Laterality: N/A;  . NASAL SEPTUM SURGERY     UP3  . PERCUTANEOUS CORONARY STENT INTERVENTION (PCI-S)  10/2008   mRCA PCI  2.75 x 23 Abbott Xience V drug-eluting stent   . ULTRASOUND GUIDANCE FOR VASCULAR ACCESS  12/15/2017   Procedure: Ultrasound Guidance For Vascular Access;  Surgeon: [name], MD;  Location: [place] CV LAB;  Service: Cardiovascular;;   Family History  Problem Relation Age of Onset  . Pancreatic cancer Brother   . Diabetes Brother   .  Coronary artery disease Brother   . Stroke Brother   . Diabetes Brother   . Diabetes Mother   . Heart failure Mother   . Heart failure Father   . Hypothyroidism Brother   . Coronary artery disease Brother   . Other Brother        colon surgery  . Heart attack Unknown        Nephew  . Irregular heart beat Daughter   . Cancer Maternal Grandmother        unknown    Social History   Socioeconomic History  . Marital status: Married    Spouse name: Not on file  . Number  of children: 4  . Years of education: 16  . Highest education level: Not on file  Occupational History  . Occupation: retired  Scientific laboratory technician  . Financial resource strain: Not on file  . Food insecurity:    Worry: Not on file    Inability: Not on file  . Transportation needs:    Medical: Not on file    Non-medical: Not on file  Tobacco Use  . Smoking status: Never Smoker  . Smokeless tobacco: Never Used  Substance and Sexual Activity  . Alcohol use: No    Alcohol/week: 0.0 oz    Comment: stopped drinking in 1998  . Drug use: No  . Sexual activity: Not on file  Lifestyle  . Physical activity:    Days per week: Not on file    Minutes per session: Not on file  . Stress: Not on file  Relationships  . Social connections:    Talks on phone: Not on file    Gets together: Not on file    Attends religious service: Not on file    Active member of club or organization: Not on file    Attends meetings of clubs or organizations: Not on file    Relationship status: Not on file  Other Topics Concern  . Not on file  Social History Narrative   Patient is right handed.   Patient does not drink caffeine.    Outpatient Encounter Medications as of 03/20/2018  Medication Sig  . amiodarone (PACERONE) 400 MG tablet Take 200 mg by mouth daily.  Marland Kitchen atorvastatin (LIPITOR) 40 MG tablet TAKE 1 & 1/2 TABLETS (60 MG TOTAL) BY MOUTH DAILY.  Marland Kitchen diclofenac sodium (VOLTAREN) 1 % GEL Apply 2 g topically daily as needed (for pain  in affected area).  . divalproex (DEPAKOTE ER) 500 MG 24 hr tablet TAKE 1 TABLET (500 MG TOTAL) BY MOUTH DAILY.  Marland Kitchen gabapentin (NEURONTIN) 100 MG capsule Take 2 capsules (200 mg total) by mouth at bedtime.  Marland Kitchen glucose blood (ACCU-CHEK AVIVA) test strip Use as directed to check blood sugar 4 times daily.  DX E11.9  . insulin aspart (NOVOLOG FLEXPEN) 100 UNIT/ML FlexPen 3 times a day (just before each meal) 65-45-65, and pen needles 3/day  . insulin NPH Human (HUMULIN N,NOVOLIN N) 100 UNIT/ML injection Inject 0.7 mLs (70 Units total) into the skin at bedtime. And syringes 1/day  . isosorbide mononitrate (IMDUR) 60 MG 24 hr tablet TAKE TWO TABLETS BY MOUTH DAILY  . levothyroxine (SYNTHROID, LEVOTHROID) 88 MCG tablet TAKE 1 TABLET (88 MCG TOTAL) BY MOUTH DAILY BEFORE BREAKFAST.  Marland Kitchen loperamide (IMODIUM A-D) 2 MG tablet Take 1 tablet (2 mg total) by mouth 4 (four) times daily as needed for diarrhea or loose stools.  . metoprolol succinate (TOPROL-XL) 50 MG 24 hr tablet TAKE 1&1/2 TABLETS BY MOUTH ONCE A DAY FOR A TOTAL OF 75 MG.  . midodrine (PROAMATINE) 10 MG tablet TAKE 1 TABLET (10 MG TOTAL) BY MOUTH 3 TIMES DAILY.  . nitroGLYCERIN (NITROSTAT) 0.4 MG SL tablet Place 1 tablet (0.4 mg total) under the tongue every 5 (five) minutes as needed for chest pain.  . nortriptyline (PAMELOR) 10 MG capsule Take 1 capsule (10 mg total) by mouth at bedtime.  . ondansetron (ZOFRAN ODT) 4 MG disintegrating tablet Take 1 tablet (4 mg total) by mouth every 8 (eight) hours as needed for nausea or vomiting.  . pantoprazole (PROTONIX) 40 MG tablet TAKE 1 TABLET BY MOUTH TWICE DAILY FOR 2  WEEKS THEN DROP TO ONCE DAILY (Patient taking differently: 1 t po qd)  . pramipexole (MIRAPEX) 1 MG tablet TAKE 1/2 TO 1 TABLET BY MOUTH AT BEDTIME  . ranolazine (RANEXA) 500 MG 12 hr tablet Take 1 tablet (500 mg total) by mouth 2 (two) times daily.  . sertraline (ZOLOFT) 100 MG tablet TAKE 1 TABLET (100 MG TOTAL) BY MOUTH 2 (TWO) TIMES  DAILY.  Marland Kitchen Vitamin D, Ergocalciferol, (DRISDOL) 50000 units CAPS capsule TAKE 1 CAPSULE (50,000 UNITS TOTAL) BY MOUTH EVERY 7 (SEVEN) DAYS.  Marland Kitchen warfarin (COUMADIN) 5 MG tablet TAKE AS DIRECTED BY COUMADIN CLINIC   No facility-administered encounter medications on file as of 03/20/2018.     Activities of Daily Living In your present state of health, do you have any difficulty performing the following activities: 03/17/2017  Hearing? N  Vision? N  Difficulty concentrating or making decisions? Y  Walking or climbing stairs? N  Dressing or bathing? N  Doing errands, shopping? N  Preparing Food and eating ? N  Using the Toilet? N  In the past six months, have you accidently leaked urine? N  Do you have problems with loss of bowel control? N  Managing your Medications? Y  Comment wife manages meds  Managing your Finances? Y  Comment wife Holiday representative or managing your Housekeeping? N  Some recent data might be hidden    Patient Care Team: [name], MD as PCP - General (Family Medicine) [name], MD as PCP - Cardiology (Cardiology) Pieter Partridge, DO as Consulting Physician (Neurology) [name], MD as Consulting Physician (Cardiology) Deboraha Sprang, MD as Consulting Physician (Cardiology) [place], Mike Gip, MD as Consulting Physician (Pulmonary Disease) Warden Fillers, MD as Consulting Physician (Ophthalmology)   Assessment:   This is a routine wellness examination for [place]. Physical assessment deferred to PCP.  Exercise Activities and Dietary recommendations   Diet (meal preparation, eat out, water intake, caffeinated beverages, dairy products, fruits and vegetables): {Desc; diets:16563} Breakfast: Lunch:  Dinner:      Goals    None      Fall Risk Fall Risk  01/24/2018 04/21/2017 03/17/2017 01/21/2017 12/30/2016  Falls in the past year? No No Yes Yes No  Comment - - - - -  Number falls in past yr: - - 2 or more 2 or  more -  Injury with Fall? - - No No -  Risk Factor Category  - - - - -  Risk for fall due to : - - Impaired balance/gait;Mental status change - -  Follow up - - - - -    Depression Screen PHQ 2/9 Scores 04/21/2017 03/17/2017 12/30/2016 01/07/2015  PHQ - 2 Score 0 1 0 0    Cognitive Function MMSE - Mini Mental State Exam 04/21/2017 01/21/2017 01/15/2016 02/18/2015  Not completed: Unable to complete - - -  Orientation to time - 4 5 5   Orientation to Place - 5 4 5   Registration - 3 3 3   Attention/ Calculation - 0 0 2  Recall - 3 2 3   Language- name 2 objects - 2 2 2   Language- repeat - 1 1 1   Language- follow 3 step command - 3 3 3   Language- read & follow direction - 1 1 1   Write a sentence - 1 1 1   Copy design - 1 1 1   Total score - 24 23 27         Immunization History  Administered Date(s) Administered  . Influenza Split 09/21/2012  . Influenza Whole 10/16/2007, 10/01/2008, 09/23/2009, 09/22/2010  . Influenza, High Dose Seasonal PF 09/08/2017  . Influenza,inj,Quad PF,6+ Mos 08/22/2015, 08/22/2015, 08/24/2016  . Influenza-Unspecified 09/04/2014  . Pneumococcal Conjugate-13 08/24/2016  . Pneumococcal Polysaccharide-23 12/18/2008, 01/03/2015  . Td 12/18/2007  . Zoster 11/25/2010, 11/30/2012    Screening Tests Health Maintenance  Topic Date Due  . Hepatitis C Screening  1946/06/06  . OPHTHALMOLOGY EXAM  06/20/2012  . URINE MICROALBUMIN  04/30/2016  . TETANUS/TDAP  12/17/2017  . INFLUENZA VACCINE  07/06/2018  . HEMOGLOBIN A1C  09/08/2018  . COLONOSCOPY  02/11/2019  . FOOT EXAM  03/10/2019  . PNA vac Low Risk Adult  Completed      Plan:   ***  I have personally reviewed and noted the following in the patient's chart:   . Medical and social history . Use of alcohol, tobacco or illicit drugs  . Current medications and supplements . Functional ability and status . Nutritional status . Physical activity . Advanced directives . List of other  physicians . Hospitalizations, surgeries, and ER visits in previous 12 months . Vitals . Screenings to include cognitive, depression, and falls . Referrals and appointments  In addition, I have reviewed and discussed with patient certain preventive protocols, quality metrics, and best practice recommendations. A written personalized care plan for preventive services as well as general preventive health recommendations were provided to patient.     Shela Nevin, [place]  03/16/2018

## 2018-03-20 ENCOUNTER — Ambulatory Visit: Payer: Medicare PPO | Admitting: *Deleted

## 2018-03-20 ENCOUNTER — Ambulatory Visit (INDEPENDENT_AMBULATORY_CARE_PROVIDER_SITE_OTHER): Payer: Medicare PPO | Admitting: *Deleted

## 2018-03-20 DIAGNOSIS — I4891 Unspecified atrial fibrillation: Secondary | ICD-10-CM

## 2018-03-20 DIAGNOSIS — Z5181 Encounter for therapeutic drug level monitoring: Secondary | ICD-10-CM | POA: Diagnosis not present

## 2018-03-20 DIAGNOSIS — Z7901 Long term (current) use of anticoagulants: Secondary | ICD-10-CM

## 2018-03-20 DIAGNOSIS — I480 Paroxysmal atrial fibrillation: Secondary | ICD-10-CM | POA: Diagnosis not present

## 2018-03-20 LAB — POCT INR: INR: 1.1

## 2018-03-20 NOTE — Patient Instructions (Addendum)
  Description   Has been holding coumadin since April 10th  And continue to hold until after procedure on April 16th then when instructed to restart coumadin restart at same dose  1 tablet everyday except 1.5 tablets on Mondays and Fridays.  Recheck in 2 weeks. Call us with any medication changes [phone] [facility]. Main # [phone].

## 2018-03-21 ENCOUNTER — Ambulatory Visit: Payer: Medicare PPO | Admitting: Family Medicine

## 2018-03-21 ENCOUNTER — Ambulatory Visit
Admission: RE | Admit: 2018-03-21 | Discharge: 2018-03-21 | Disposition: A | Discharge status: Home / Self Care | Payer: Medicare PPO | Source: Ambulatory Visit | Attending: Family Medicine | Admitting: Family Medicine

## 2018-03-21 DIAGNOSIS — M5412 Radiculopathy, cervical region: Secondary | ICD-10-CM

## 2018-03-21 DIAGNOSIS — M50221 Other cervical disc displacement at C4-C5 level: Secondary | ICD-10-CM | POA: Diagnosis not present

## 2018-03-21 DIAGNOSIS — M5021 Other cervical disc displacement,  high cervical region: Secondary | ICD-10-CM | POA: Diagnosis not present

## 2018-03-21 MED ORDER — IOPAMIDOL (ISOVUE-M 300) INJECTION 61%
10.0000 mL | Freq: Once | INTRAMUSCULAR | Status: AC | PRN
  Administered 2018-03-21: 10 mL via INTRATHECAL

## 2018-03-21 MED ORDER — DIAZEPAM 5 MG PO TABS
5.0000 mg | ORAL_TABLET | Freq: Once | ORAL | Status: AC
  Administered 2018-03-21: 5 mg via ORAL

## 2018-03-21 NOTE — Discharge Instructions (Signed)
Myelogram Discharge Instructions  1. Go home and rest quietly for the next 24 hours.  It is important to lie flat for the next 24 hours.  Get up only to go to the restroom.  You may lie in the bed or on a couch on your back, your stomach, your left side or your right side.  You may have one pillow under your head.  You may have pillows between your knees while you are on your side or under your knees while you are on your back.  2. DO NOT drive today.  Recline the seat as far back as it will go, while still wearing your seat belt, on the way home.  3. You may get up to go to the bathroom as needed.  You may sit up for 10 minutes to eat.  You may resume your normal diet and medications unless otherwise indicated.  Drink lots of extra fluids today and tomorrow.  4. The incidence of headache, nausea, or vomiting is about 5% (one in 20 patients).  If you develop a headache, lie flat and drink plenty of fluids until the headache goes away.  Caffeinated beverages may be helpful.  If you develop severe nausea and vomiting or a headache that does not go away with flat bed rest, call [phone].  5. You may resume normal activities after your 24 hours of bed rest is over; however, do not exert yourself strongly or do any heavy lifting tomorrow. If when you get up you have a headache when standing, go back to bed and force fluids for another 24 hours.  6. Call your physician for a follow-up appointment.  The results of your myelogram will be sent directly to your physician by the following day.  7. If you have any questions or if complications develop after you arrive home, please call [phone].  Discharge instructions have been explained to the patient.  The patient, or the person responsible for the patient, fully understands these instructions.  YOU MAY RESTART YOUR WARFARIN TODAY. YOU MAY RESTART YOUR ZOLOFT TOMORROW 03/22/2018 AT 9:30AM.

## 2018-03-23 ENCOUNTER — Encounter: Payer: Medicare PPO | Admitting: Psychology

## 2018-03-24 MED FILL — NovoLIN N 100 UNIT/ML SUSP: 100 | 17 days supply | Qty: 10 | Fill #2

## 2018-03-27 ENCOUNTER — Ambulatory Visit: Payer: Medicare PPO | Admitting: Psychology

## 2018-03-27 ENCOUNTER — Encounter: Payer: Self-pay | Admitting: Psychology

## 2018-03-27 DIAGNOSIS — G3184 Mild cognitive impairment, so stated: Secondary | ICD-10-CM

## 2018-03-27 DIAGNOSIS — R413 Other amnesia: Secondary | ICD-10-CM | POA: Diagnosis not present

## 2018-03-27 NOTE — Progress Notes (Addendum)
   Neuropsychology Note  Ridge Lafond Mallek completed 60 minutes of neuropsychological testing with technician, Milana Kidney, BS, under the supervision of Dr. Macarthur Critchley, Licensed Psychologist. The patient did not appear overtly distressed by the testing session, per behavioral observation or via self-report to the technician. Rest breaks were offered.   Clinical Decision Making: In considering the patient's current level of functioning, level of presumed impairment, nature of symptoms, emotional and behavioral responses during the interview, level of literacy, and observed level of motivation/effort, a battery of tests was selected and communicated to the psychometrician.  Communication between the psychologist and technician was ongoing throughout the testing session and changes were made as deemed necessary based on patient performance on testing, technician observations and additional pertinent factors such as those listed above.  Cinda Quest Depierro will return within approximately 2 weeks for an interactive feedback session with Dr. Si Raider at which time his test performances, clinical impressions and treatment recommendations will be reviewed in detail. The patient understands he can contact our office should he require our assistance before this time.  15 minutes spent performing neuropsychological evaluation services/clinical decision making (psychologist). [CPT 23536] 60 minutes spent face-to-face with patient administering standardized tests, 30 minutes spent scoring (technician). [CPT [id number], 14431]  Full report to follow.
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
## 2018-03-29 ENCOUNTER — Other Ambulatory Visit: Payer: Self-pay | Admitting: Cardiology

## 2018-03-29 ENCOUNTER — Other Ambulatory Visit: Payer: Self-pay | Admitting: Family Medicine

## 2018-03-29 MED FILL — LEVOTHYROXINE 88 MCG TABLET: 88 | 30 days supply | Qty: 30 | Fill #2

## 2018-03-29 MED FILL — ATORVASTATIN 40 MG TABLET: 40 | 30 days supply | Qty: 45 | Fill #3

## 2018-03-29 MED FILL — WARFARIN SODIUM 5 MG TABLET: 5 | 30 days supply | Qty: 45 | Fill #2

## 2018-03-29 MED FILL — METOPROLOL SUCCINATE ER 50: 50 | 30 days supply | Qty: 45 | Fill #0

## 2018-03-29 MED FILL — GABAPENTIN 100 MG CAPSULE: 100 | 30 days supply | Qty: 60 | Fill #1

## 2018-03-29 NOTE — Telephone Encounter (Signed)
REFILL 

## 2018-03-30 ENCOUNTER — Telehealth: Payer: Self-pay

## 2018-03-30 ENCOUNTER — Encounter: Payer: Medicare PPO | Admitting: Psychology

## 2018-03-30 MED FILL — PANTOPRAZOLE SOD DR 40 MG T: 40 | 45 days supply | Qty: 45 | Fill #0

## 2018-03-30 NOTE — Telephone Encounter (Signed)
Spoke with patient about plan moving forward based on CT results from Dr. Tamala Julian ie. Injections or a referral to a Psychologist, sport and exercise. He would like to think about what he would like to do and give [place] a call back.

## 2018-03-30 NOTE — Telephone Encounter (Signed)
Copied from [name] [id number]. Topic: Inquiry >> Mar 29, 2018 12:54 PM [name] [name], NT wrote: Reason for CRM: patient is calling and states that Dr. Tamala Julian performed a test about what was causing his headaches. Patient has not heard what the results were and would like a call.

## 2018-03-31 MED FILL — DIVALPROEX SOD ER 500 MG TA: 500 | 30 days supply | Qty: 30 | Fill #1

## 2018-04-03 ENCOUNTER — Other Ambulatory Visit: Payer: Self-pay | Admitting: Family Medicine

## 2018-04-03 ENCOUNTER — Ambulatory Visit (INDEPENDENT_AMBULATORY_CARE_PROVIDER_SITE_OTHER): Payer: Medicare PPO | Admitting: Interventional Cardiology

## 2018-04-03 ENCOUNTER — Telehealth: Payer: Self-pay

## 2018-04-03 ENCOUNTER — Ambulatory Visit (INDEPENDENT_AMBULATORY_CARE_PROVIDER_SITE_OTHER): Payer: Medicare PPO | Admitting: *Deleted

## 2018-04-03 ENCOUNTER — Encounter: Payer: Self-pay | Admitting: Interventional Cardiology

## 2018-04-03 VITALS — BP 152/62 | HR 60 | Ht 69.0 in | Wt 204.8 lb

## 2018-04-03 DIAGNOSIS — I4891 Unspecified atrial fibrillation: Secondary | ICD-10-CM | POA: Diagnosis not present

## 2018-04-03 DIAGNOSIS — R42 Dizziness and giddiness: Secondary | ICD-10-CM | POA: Diagnosis not present

## 2018-04-03 DIAGNOSIS — E1159 Type 2 diabetes mellitus with other circulatory complications: Secondary | ICD-10-CM

## 2018-04-03 DIAGNOSIS — Z7901 Long term (current) use of anticoagulants: Secondary | ICD-10-CM

## 2018-04-03 DIAGNOSIS — I480 Paroxysmal atrial fibrillation: Secondary | ICD-10-CM

## 2018-04-03 DIAGNOSIS — Z5181 Encounter for therapeutic drug level monitoring: Secondary | ICD-10-CM | POA: Diagnosis not present

## 2018-04-03 DIAGNOSIS — Z951 Presence of aortocoronary bypass graft: Secondary | ICD-10-CM

## 2018-04-03 DIAGNOSIS — M542 Cervicalgia: Secondary | ICD-10-CM

## 2018-04-03 DIAGNOSIS — I25118 Atherosclerotic heart disease of native coronary artery with other forms of angina pectoris: Secondary | ICD-10-CM

## 2018-04-03 LAB — POCT INR: INR: 3.5

## 2018-04-03 MED ORDER — MIDODRINE HCL 10 MG PO TABS: ORAL_TABLET | ORAL | 3 refills | Status: DC
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
## 2018-04-03 NOTE — Patient Instructions (Signed)
Description   Do not take coumadin today April 29th then continue same dose of coumadin   1 tablet everyday except 1.5 tablets on Mondays and Fridays.  Recheck in 2 weeks. Call us with any medication changes [phone] [facility]. Main # [phone]. Increase intake of greens to 2 servings a week

## 2018-04-03 NOTE — Patient Instructions (Signed)
Medication Instructions:  Your physician has recommended you make the following change in your medication:   DECREASE: midodrine 10 mg tablet: Take 1/2 tablet (5 mg total) three times a day  Labwork: None ordered  Testing/Procedures: None ordered  Follow-Up: Your physician wants you to follow-up in: 1 year with [name]. You will receive a reminder letter in the mail two months in advance. If you don't receive a letter, please call our office to schedule the follow-up appointment.   Any Other Special Instructions Will Be Listed Below (If Applicable).  DRINK PLENTY OF WATER TO STAY HYDRATED  DECREASE THE AMOUNT OF SODA THAT YOU DRINK   If you need a refill on your cardiac medications before your next appointment, please call your pharmacy.

## 2018-04-03 NOTE — Telephone Encounter (Signed)
Copied from [place] [id number]. Topic: Referral - Request >> Mar 30, 2018  8:50 AM Synthia Innocent wrote: Reason for CRM: Requesting to move forward with referral for neck surgery     Please advise

## 2018-04-07 MED FILL — NOVOLOG FLEXPEN SYRINGE: 100 | 8 days supply | Qty: 15 | Fill #0

## 2018-04-07 MED FILL — NovoLIN N 100 UNIT/ML SUSP: 100 | 17 days supply | Qty: 10 | Fill #3

## 2018-04-16 NOTE — Progress Notes (Signed)
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 After a delay, free recall was high average (stable). Cued recall was average (stable). Performance on a yes/no recognition task was average (stable). On another verbal memory test, encoding and acquisition of contextual auditory information (i.e., short stories) was average (stable). After a delay, free recall was high average (stable). Performance on a yes/no recognition task was within normal limits. With regard to non-verbal memory, delayed free recall of visual information was low average (stable). Executive functioning was somewhat variable. Mental flexibility and set-shifting were borderline on Trails B (stable). Verbal fluency with phonemic search restrictions was borderline but improved relative to last evaluation. Verbal abstract reasoning was average (stable). Performance on a clock drawing task was normal (stable). On a self-report measure of mood, the patient's responses were not indicative of clinically significant depression at the present time. He did endorse some depressive symptoms, though, including  feeling sad much of the time, pessimism, guilty feelings, loss of self confidence, tearfulness, indecisiveness, reduced energy, decreased sleep, concentration difficulties. He denied suicidal ideation or intention. On a self-report measure of  anxiety, the patient endorsed a clinically significant and severe level of generalized anxiety at the present time, characterized by inability to control worrying, excessive worries, restlessness, irritability, nervousness, and difficulty relaxing.    Clinical Impressions:Non-amnestic Mild Cognitive Impairment (no interval decline since last year);bipolar disorder (by history); Adjustment disorder with anxious mood. Results of cognitive testing revealedstable functioning relative to last year's performances. Testing again demonstrated mostlynormal performances but there continues to be evidence of mild executive dysfunction.His testing profile again does not indicate dementia, and a diagnosis of mild cognitive impairment continues to be most appropriate.  As discussed last year, the patient's cognitive profile is reflective of mild frontal-subcortical dysfunction. This could certainly be related to repeated cerebral hypoprofusionand history of poorly controlled diabetes with multiple hyperglycemic episodes. There is no indication from this evaluation of a memory disorder or underlying Alzheimer's disease. While the patient has a history of diagnosed bipolar disorder, he has been stable psychiatrically for many years without a mood stabilizer (takes sertraline). He does report increased anxiety even since last year, which appears related to his lack of ability to engage in activities he used to enjoy.  Recommendations/Plan: Based on the findings of the present evaluation, the following recommendations are offered:  1. Optimal control of vascular risk factors is again encouraged. His A1c was 9.1 at his last endocrinology appointment. 2. He may benefit from a multiple sleep latency test given that he is falling asleep during the day during activities without meaning to.  3. He was reassured that his testing results do not indicate any cognitive decline over the past year.  4. He is reporting  increased generalized anxiety this year. I believe he is at the max dose of sertraline. Change in medication or addition of an additional medication may be indicated. He may also benefit from counseling to assist with adjustment related anxiety/depression.    Feedback to Patient: Jadrien Narine Godlewski returned for a feedback appointment on 04/18/2018 to review the results of his neuropsychological evaluation with this provider. 15 minutes face-to-face time was spent reviewing his test results, my impressions and my recommendations as detailed above.    Total time spent on this patient's case: 75 minutes for neurobehavioral status exam with psychologist (CPT code [phone]); 90 minutes of testing/scoring by psychometrician under psychologist's supervision (CPT codes [phone], [phone] units); 180 minutes for integration of patient data, interpretation of standardized test results and clinical data, clinical decision making, treatment planning and preparation of this report, and interactive feedback with review of results to the patient/family by psychologist (CPT codes [phone], [phone] units).      Thank you for your referral of Lucas Winograd Briel. Please feel free to contact me if you have any questions or concerns regarding this report.

## 2018-04-17 ENCOUNTER — Ambulatory Visit (INDEPENDENT_AMBULATORY_CARE_PROVIDER_SITE_OTHER): Payer: Medicare PPO | Admitting: Pharmacist

## 2018-04-17 DIAGNOSIS — Z7901 Long term (current) use of anticoagulants: Secondary | ICD-10-CM | POA: Diagnosis not present

## 2018-04-17 DIAGNOSIS — I480 Paroxysmal atrial fibrillation: Secondary | ICD-10-CM

## 2018-04-17 DIAGNOSIS — Z5181 Encounter for therapeutic drug level monitoring: Secondary | ICD-10-CM

## 2018-04-17 LAB — POCT INR: INR: 3.4

## 2018-04-17 NOTE — Patient Instructions (Signed)
Description   Skip your Coumadin today, then start taking coumadin 1 tablet everyday except 1.5 tablets on Mondays.  Recheck in 2 weeks. Call us with any medication changes [phone] [facility]. Main # [phone]. Increase intake of greens to 2 servings a week

## 2018-04-18 ENCOUNTER — Encounter: Payer: Self-pay | Admitting: Psychology

## 2018-04-18 ENCOUNTER — Ambulatory Visit (INDEPENDENT_AMBULATORY_CARE_PROVIDER_SITE_OTHER): Payer: Medicare PPO | Admitting: Psychology

## 2018-04-18 DIAGNOSIS — R413 Other amnesia: Secondary | ICD-10-CM | POA: Diagnosis not present

## 2018-04-18 DIAGNOSIS — G3184 Mild cognitive impairment, so stated: Secondary | ICD-10-CM

## 2018-04-18 NOTE — Patient Instructions (Signed)
Great news: Testing did not show any interval decline in cognitive function since your last evaluation a year ago. I don't see any evidence of a memory disorder or Alzheimer's disease. As we discussed last year, your cognitive difficulties are likely related to poorly controlled diabetes and hyperglycemic episodes. Optimal control of diabetes is encouraged.

## 2018-04-19 MED FILL — PRAMIPEXOLE 1 MG TABLET: 1 | 30 days supply | Qty: 30 | Fill #1

## 2018-04-20 ENCOUNTER — Ambulatory Visit (INDEPENDENT_AMBULATORY_CARE_PROVIDER_SITE_OTHER): Payer: Medicare PPO | Admitting: Neurology

## 2018-04-20 ENCOUNTER — Encounter: Payer: Self-pay | Admitting: Neurology

## 2018-04-20 VITALS — BP 136/72 | HR 62 | Ht 69.0 in | Wt 206.0 lb

## 2018-04-20 DIAGNOSIS — Z79899 Other long term (current) drug therapy: Secondary | ICD-10-CM

## 2018-04-20 DIAGNOSIS — G3184 Mild cognitive impairment, so stated: Secondary | ICD-10-CM | POA: Diagnosis not present

## 2018-04-20 DIAGNOSIS — M5481 Occipital neuralgia: Secondary | ICD-10-CM

## 2018-04-20 DIAGNOSIS — R51 Headache: Secondary | ICD-10-CM

## 2018-04-20 DIAGNOSIS — R569 Unspecified convulsions: Secondary | ICD-10-CM | POA: Diagnosis not present

## 2018-04-20 DIAGNOSIS — G901 Familial dysautonomia [Riley-Day]: Secondary | ICD-10-CM | POA: Diagnosis not present

## 2018-04-20 DIAGNOSIS — M542 Cervicalgia: Secondary | ICD-10-CM | POA: Diagnosis not present

## 2018-04-20 DIAGNOSIS — G4486 Cervicogenic headache: Secondary | ICD-10-CM

## 2018-04-20 MED ORDER — DIVALPROEX SODIUM ER 250 MG PO TB24: 750.0000 mg | ORAL_TABLET | Freq: Every day | ORAL | 5 refills | Status: DC

## 2018-04-20 NOTE — Patient Instructions (Signed)
1.  Stop nortriptyline 2.  We will increase Depakote (divalproex).  Stop the 500mg  pills.  Instead, take three 250mg  pills (total 750mg ) at bedtime. 3.  Check CMP in 3 months. 4.  Follow up in 5 months.
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
## 2018-04-26 DIAGNOSIS — M4722 Other spondylosis with radiculopathy, cervical region: Secondary | ICD-10-CM | POA: Diagnosis not present

## 2018-04-28 MED FILL — NOVOLOG FLEXPEN SYRINGE: 100 | 8 days supply | Qty: 15 | Fill #1

## 2018-04-28 MED FILL — DIVALPROEX SOD ER 250 MG TA: 250 | 30 days supply | Qty: 90 | Fill #0

## 2018-04-28 MED FILL — NovoLIN N 100 UNIT/ML SUSP: 100 | 17 days supply | Qty: 10 | Fill #4

## 2018-05-02 ENCOUNTER — Other Ambulatory Visit: Payer: Self-pay | Admitting: Family Medicine

## 2018-05-02 MED FILL — ATORVASTATIN 40 MG TABLET: 40 | 30 days supply | Qty: 45 | Fill #0

## 2018-05-02 MED FILL — WARFARIN SODIUM 5 MG TABLET: 5 | 30 days supply | Qty: 45 | Fill #3

## 2018-05-02 MED FILL — LEVOTHYROXINE 88 MCG TABLET: 88 | 30 days supply | Qty: 30 | Fill #3

## 2018-05-02 MED FILL — METOPROLOL SUCCINATE ER 50: 50 | 30 days supply | Qty: 45 | Fill #1

## 2018-05-02 MED FILL — MIDODRINE HCL 10 MG TABLET: 10 | 30 days supply | Qty: 90 | Fill #8

## 2018-05-03 ENCOUNTER — Ambulatory Visit (INDEPENDENT_AMBULATORY_CARE_PROVIDER_SITE_OTHER): Payer: Medicare PPO | Admitting: *Deleted

## 2018-05-03 DIAGNOSIS — Z7901 Long term (current) use of anticoagulants: Secondary | ICD-10-CM

## 2018-05-03 DIAGNOSIS — I480 Paroxysmal atrial fibrillation: Secondary | ICD-10-CM

## 2018-05-03 DIAGNOSIS — Z5181 Encounter for therapeutic drug level monitoring: Secondary | ICD-10-CM

## 2018-05-03 LAB — POCT INR: INR: 3.5 — AB (ref 2.0–3.0)

## 2018-05-03 NOTE — Patient Instructions (Signed)
Description   Do not take coumadin today May 29th then do dose change as instructed on last visit coumadin 1 tablet everyday except 1.5 tablets only on  Mondays.  Recheck in 2 weeks. Call us with any medication changes [phone] [facility]. Main # [phone]. Increase intake of greens to 2 servings a week

## 2018-05-08 MED FILL — PANTOPRAZOLE SOD DR 40 MG T: 40 | 45 days supply | Qty: 45 | Fill #1

## 2018-05-11 MED FILL — NovoLIN N 100 UNIT/ML SUSP: 100 | 17 days supply | Qty: 10 | Fill #5

## 2018-05-12 ENCOUNTER — Ambulatory Visit: Payer: Medicare PPO | Admitting: Endocrinology

## 2018-05-12 DIAGNOSIS — Z0289 Encounter for other administrative examinations: Secondary | ICD-10-CM

## 2018-05-24 MED FILL — TOUJEO SOLOSTAR 300 UNITS/M: 300 | 32 days supply | Qty: 5 | Fill #4

## 2018-05-24 MED FILL — NOVOLOG FLEXPEN SYRINGE: 100 | 8 days supply | Qty: 15 | Fill #2

## 2018-05-24 MED FILL — PRAMIPEXOLE 1 MG TABLET: 1 | 30 days supply | Qty: 30 | Fill #2

## 2018-05-24 MED FILL — NovoLIN N 100 UNIT/ML SUSP: 100 | 17 days supply | Qty: 10 | Fill #6

## 2018-05-24 MED FILL — DIVALPROEX SOD ER 250 MG TA: 250 | 30 days supply | Qty: 90 | Fill #1

## 2018-05-25 MED FILL — VIT D2 1.25 MG (50,000 UNIT: 1.25 MG | 84 days supply | Qty: 12 | Fill #2

## 2018-05-25 MED FILL — GABAPENTIN 100 MG CAPSULE: 100 | 30 days supply | Qty: 60 | Fill #2

## 2018-05-29 ENCOUNTER — Other Ambulatory Visit: Payer: Self-pay

## 2018-05-29 DIAGNOSIS — G4730 Sleep apnea, unspecified: Secondary | ICD-10-CM

## 2018-05-29 MED ORDER — DICLOFENAC SODIUM 1 % TD GEL: 2.0000 g | Freq: Every day | TRANSDERMAL | 1 refills | Status: DC | PRN

## 2018-05-29 MED FILL — DICLOFENAC SODIUM 1% GEL: 1 | 90 days supply | Qty: 200 | Fill #0

## 2018-05-30 ENCOUNTER — Ambulatory Visit (INDEPENDENT_AMBULATORY_CARE_PROVIDER_SITE_OTHER): Payer: Medicare PPO | Admitting: *Deleted

## 2018-05-30 DIAGNOSIS — Z5181 Encounter for therapeutic drug level monitoring: Secondary | ICD-10-CM | POA: Diagnosis not present

## 2018-05-30 DIAGNOSIS — Z7901 Long term (current) use of anticoagulants: Secondary | ICD-10-CM

## 2018-05-30 DIAGNOSIS — I480 Paroxysmal atrial fibrillation: Secondary | ICD-10-CM | POA: Diagnosis not present

## 2018-05-30 LAB — POCT INR: INR: 3.2 — AB (ref 2.0–3.0)

## 2018-05-30 NOTE — Patient Instructions (Signed)
Description   Skip today's dose, then start taking 1 tablet daily.  Recheck in 2 weeks. Call us with any medication changes [phone] [facility]. Main # [phone]. Increase intake of greens to 2 servings a week

## 2018-06-07 ENCOUNTER — Other Ambulatory Visit: Payer: Self-pay | Admitting: Family Medicine

## 2018-06-07 NOTE — Telephone Encounter (Signed)
Requesting nitroglycerin refill, last refilled 07/2017. Last OV with Dr. Charlett Blake 01/10/18, please advise.

## 2018-06-09 MED FILL — NITROGLYCERIN 0.4 MG TAB SL: 0.4 | 7 days supply | Qty: 25 | Fill #0

## 2018-06-13 ENCOUNTER — Ambulatory Visit (INDEPENDENT_AMBULATORY_CARE_PROVIDER_SITE_OTHER): Payer: Medicare PPO | Admitting: *Deleted

## 2018-06-13 DIAGNOSIS — I480 Paroxysmal atrial fibrillation: Secondary | ICD-10-CM

## 2018-06-13 DIAGNOSIS — Z5181 Encounter for therapeutic drug level monitoring: Secondary | ICD-10-CM | POA: Diagnosis not present

## 2018-06-13 DIAGNOSIS — I4891 Unspecified atrial fibrillation: Secondary | ICD-10-CM

## 2018-06-13 DIAGNOSIS — Z7901 Long term (current) use of anticoagulants: Secondary | ICD-10-CM

## 2018-06-13 LAB — POCT INR: INR: 2.6 (ref 2.0–3.0)

## 2018-06-13 NOTE — Patient Instructions (Signed)
Description   Continue taking 1 tablet daily.  Recheck in 2 weeks. Call us with any medication changes [phone] [facility]. Main # [phone]. Increase intake of greens to 2 servings a week

## 2018-06-15 ENCOUNTER — Other Ambulatory Visit: Payer: Self-pay | Admitting: Family Medicine

## 2018-06-15 MED FILL — NovoLIN N 100 UNIT/ML SUSP: 100 | 17 days supply | Qty: 10 | Fill #7

## 2018-06-15 MED FILL — LEVOTHYROXINE 88 MCG TABLET: 88 | 30 days supply | Qty: 30 | Fill #4

## 2018-06-15 MED FILL — NOVOLOG FLEXPEN SYRINGE: 100 | 8 days supply | Qty: 15 | Fill #3

## 2018-06-16 ENCOUNTER — Telehealth: Payer: Self-pay | Admitting: Interventional Cardiology

## 2018-06-16 ENCOUNTER — Ambulatory Visit: Payer: Self-pay

## 2018-06-16 MED FILL — ISOSORBIDE MN ER 60 MG TAB: 60 | 90 days supply | Qty: 180 | Fill #0

## 2018-06-16 NOTE — Telephone Encounter (Signed)
New Message:      Pt c/o swelling: STAT is pt has developed SOB within 24 hours  1) How much weight have you gained and in what time span? Started on last week  2) If swelling, where is the swelling located? Both legs/right is worse and a rash has appeared  3) Are you currently taking a fluid pill? No  4) Are you currently SOB? yes  5) Do you have a log of your daily weights (if so, list)? No  6) Have you gained 3 pounds in a day or 5 pounds in a week?   7) Have you traveled recently? No

## 2018-06-16 NOTE — Telephone Encounter (Signed)
Pt. Reports swelling to both legs "for over a month an dit is getting worse." When he is up during the day legs will swell "and you can't see my ankles." Swelling can get up to the knee. Right leg is worse.Had CABG 2000 and took a vein from that leg. Noticed "little red dots on the right leg." Swelling gets better after resting in bed at night. No chest pain. Has had some shortness of breath since his surgery in 2000. Has to check his schedule for today and will call us back for an appointment.  Reason for Disposition . [1] MODERATE leg swelling (e.g., swelling extends up to knees) AND [2] new onset or worsening  Answer Assessment - Initial Assessment Questions 1. ONSET: "When did the swelling start?" (e.g., minutes, hours, days)     Over a month ago and getting worse 2. LOCATION: "What part of the leg is swollen?"  "Are both legs swollen or just one leg?"     Both legs 3. SEVERITY: "How bad is the swelling?" (e.g., localized; mild, moderate, severe)  - Localized - small area of swelling localized to one leg  - MILD pedal edema - swelling limited to foot and ankle, pitting edema < 1/4 inch (6 mm) deep, rest and elevation eliminate most or all swelling  - MODERATE edema - swelling of lower leg to knee, pitting edema > 1/4 inch (6 mm) deep, rest and elevation only partially reduce swelling  - SEVERE edema - swelling extends above knee, facial or hand swelling present      Moderate 4. REDNESS: "Does the swelling look red or infected?"     Red dots to right leg 5. PAIN: "Is the swelling painful to touch?" If so, ask: "How painful is it?"   (Scale 1-10; mild, moderate or severe)     No 6. FEVER: "Do you have a fever?" If so, ask: "What is it, how was it measured, and when did it start?"      No 7. CAUSE: "What do you think is causing the leg swelling?"     Unsure 8. MEDICAL HISTORY: "Do you have a history of heart failure, kidney disease, liver failure, or cancer?"     Heart surgery 9. RECURRENT  SYMPTOM: "Have you had leg swelling before?" If so, ask: "When was the last time?" "What happened that time?"     Yes 10. OTHER SYMPTOMS: "Do you have any other symptoms?" (e.g., chest pain, difficulty breathing)       Shortness of breath - comes and goes since 2000 11. PREGNANCY: "Is there any chance you are pregnant?" "When was your last menstrual period?"       n/a  Protocols used: LEG SWELLING AND EDEMA-A-AH

## 2018-06-16 NOTE — Telephone Encounter (Signed)
Pt have BLE swelling for 4-6 weeks now.  Right worse than left.  Pt states he has a rash on right leg.  Denies heat to area.  Has pain in both legs.  C/o DOE.  Wt this AM was 209lbs.  Pt states d/t leg pain he has been less active and has slowly gained weight.  Scheduled pt to see Lyda Jester, PA-C on 7/31.  Advised pt to get an appt with PCP to assess legs as well since he has a rash on right leg.  Advised I will send to [name] for review and advisement.

## 2018-06-22 DIAGNOSIS — G4733 Obstructive sleep apnea (adult) (pediatric): Secondary | ICD-10-CM | POA: Diagnosis not present

## 2018-06-23 ENCOUNTER — Other Ambulatory Visit: Payer: Self-pay | Admitting: Family Medicine

## 2018-06-23 ENCOUNTER — Other Ambulatory Visit (HOSPITAL_COMMUNITY): Payer: Self-pay | Admitting: Interventional Cardiology

## 2018-06-23 ENCOUNTER — Telehealth: Payer: Self-pay

## 2018-06-23 ENCOUNTER — Other Ambulatory Visit: Payer: Self-pay | Admitting: *Deleted

## 2018-06-23 MED ORDER — WARFARIN SODIUM 5 MG PO TABS: ORAL_TABLET | ORAL | 4 refills | Status: DC

## 2018-06-23 MED FILL — PRAMIPEXOLE 1 MG TABLET: 1 | 30 days supply | Qty: 30 | Fill #0

## 2018-06-23 MED FILL — WARFARIN SODIUM 5 MG TABLET: 5 | 30 days supply | Qty: 45 | Fill #0

## 2018-06-23 NOTE — Telephone Encounter (Signed)
-----   Message from [name], [place] sent at 06/23/2018  3:01 PM EDT ----- Regarding: RE: Amiodarone Patient apparently has not been taking amio for a while. Per patient's pharmacy it has not been filled since 2017. Wife states that he has not been taking it and that they do not use another pharmacy and that they will not fill Rxs sometimes if they are too expensive. Patient has been in NSR the last several times he was seen and if he is maintaining NSR off of amio, he may not need it. Patient has an appointment with Lyda Jester, PA on 7/31, we can do an EKG and see if he is still maintaining NSR. Wife stated that the patient has been completely asymptomatic and that patient will keep appointment and bring all meds that he is taking.   ----- Message ----- From: Zenovia Jarred, RN Sent: 06/23/2018  10:05 AM To: [name], MD, [name], RN Subject: Amiodarone                                     Sorry to bother... But on 03/28/17 at [facility] Amiodarone was reduced to 200mg  QD because pt never reduced as previously ordered. I can't find where it was discontinued, please advise? Thank you.   Tiffany

## 2018-06-27 ENCOUNTER — Other Ambulatory Visit: Payer: Self-pay | Admitting: Interventional Cardiology

## 2018-06-27 ENCOUNTER — Other Ambulatory Visit: Payer: Self-pay | Admitting: Family Medicine

## 2018-06-27 MED FILL — PANTOPRAZOLE SOD DR 40 MG T: 40 | 90 days supply | Qty: 90 | Fill #0

## 2018-06-27 MED FILL — GABAPENTIN 100 MG CAPSULE: 100 | 30 days supply | Qty: 60 | Fill #3

## 2018-06-27 MED FILL — METOPROLOL SUCCINATE ER 50: 50 | 30 days supply | Qty: 45 | Fill #2

## 2018-06-27 MED FILL — MIDODRINE HCL 10 MG TABLET: 10 | 30 days supply | Qty: 90 | Fill #0

## 2018-06-27 MED FILL — DIVALPROEX SOD ER 250 MG TA: 250 | 30 days supply | Qty: 90 | Fill #2

## 2018-06-27 MED FILL — NORTRIPTYLINE HCL 10 MG CAP: 10 | 30 days supply | Qty: 30 | Fill #1

## 2018-06-29 ENCOUNTER — Ambulatory Visit (INDEPENDENT_AMBULATORY_CARE_PROVIDER_SITE_OTHER): Payer: Medicare PPO | Admitting: *Deleted

## 2018-06-29 DIAGNOSIS — Z5181 Encounter for therapeutic drug level monitoring: Secondary | ICD-10-CM | POA: Diagnosis not present

## 2018-06-29 DIAGNOSIS — Z7901 Long term (current) use of anticoagulants: Secondary | ICD-10-CM

## 2018-06-29 DIAGNOSIS — M545 Low back pain: Secondary | ICD-10-CM | POA: Diagnosis not present

## 2018-06-29 DIAGNOSIS — I4891 Unspecified atrial fibrillation: Secondary | ICD-10-CM

## 2018-06-29 DIAGNOSIS — I480 Paroxysmal atrial fibrillation: Secondary | ICD-10-CM | POA: Diagnosis not present

## 2018-06-29 DIAGNOSIS — M4722 Other spondylosis with radiculopathy, cervical region: Secondary | ICD-10-CM | POA: Diagnosis not present

## 2018-06-29 LAB — POCT INR: INR: 2.2 (ref 2.0–3.0)

## 2018-06-29 NOTE — Telephone Encounter (Signed)
Patient aware of recommendations. Patient states that he will follow up with PCP. Instructed patient to let us know if his swelling persists. Patient verbalized understanding and thanked me for the call.

## 2018-06-29 NOTE — Patient Instructions (Signed)
Description   Continue taking 1 tablet daily.  Recheck in 3 weeks. Call us with any medication changes [phone] [facility]. Main # [phone]. Increase intake of greens to 2 servings a week

## 2018-06-29 NOTE — Telephone Encounter (Signed)
Since he is on Coumadin, blood clot is unlikely.  WOuld get checked for cellulitis vs. Venous stasis type rash by PMD.  Could try diuretic if swelling persists.

## 2018-07-05 ENCOUNTER — Encounter: Payer: Self-pay | Admitting: Cardiology

## 2018-07-05 ENCOUNTER — Ambulatory Visit: Payer: Medicare PPO | Admitting: Cardiology

## 2018-07-05 VITALS — BP 126/66 | HR 68 | Ht 69.0 in | Wt 212.0 lb

## 2018-07-05 DIAGNOSIS — I25118 Atherosclerotic heart disease of native coronary artery with other forms of angina pectoris: Secondary | ICD-10-CM

## 2018-07-05 DIAGNOSIS — Z951 Presence of aortocoronary bypass graft: Secondary | ICD-10-CM | POA: Diagnosis not present

## 2018-07-05 DIAGNOSIS — I480 Paroxysmal atrial fibrillation: Secondary | ICD-10-CM

## 2018-07-05 LAB — PRO B NATRIURETIC PEPTIDE: NT-PRO BNP: 74 pg/mL (ref 0–376)

## 2018-07-05 LAB — BASIC METABOLIC PANEL
BUN/Creatinine Ratio: 10 (ref 10–24)
BUN: 13 mg/dL (ref 8–27)
CALCIUM: 9.5 mg/dL (ref 8.6–10.2)
CHLORIDE: 100 mmol/L (ref 96–106)
CO2: 22 mmol/L (ref 20–29)
Creatinine, Ser: 1.29 mg/dL — ABNORMAL HIGH (ref 0.76–1.27)
GFR calc Af Amer: 65 mL/min/{1.73_m2} (ref >59)
GFR calc non Af Amer: 56 mL/min/{1.73_m2} — ABNORMAL LOW (ref >59)
GLUCOSE: 270 mg/dL — AB (ref 65–99)
POTASSIUM: 4.4 mmol/L (ref 3.5–5.2)
SODIUM: 139 mmol/L (ref 134–144)

## 2018-07-05 NOTE — Patient Instructions (Signed)
Medication Instructions:  Your physician recommends that you continue on your current medications as directed. Please refer to the Current Medication list given to you today.  Labwork: You will have labs drawn today: Pro-BNP, BMP,    Testing/Procedures:  Your physician has requested that you have an echocardiogram. Echocardiography is a painless test that uses sound waves to create images of your heart. It provides your doctor with information about the size and shape of your heart and how well your heart's chambers and valves are working. This procedure takes approximately one hour. There are no restrictions for this procedure.  Your physician has recommended that you wear a 2 week heart monitor. Holter monitors are medical devices that record the heart's electrical activity. Doctors most often use these monitors to diagnose arrhythmias. Arrhythmias are problems with the speed or rhythm of the heartbeat. The monitor is a small, portable device. You can wear one while you do your normal daily activities. This is usually used to diagnose what is causing palpitations/syncope (passing out).   Follow-Up:  Your physician recommends that you schedule a follow-up appointment in:   3-4 weeks with Lyda Jester, PA-C  **Please complete your ECHO and 14 day monitor before your follow up appointment**  Any Other Special Instructions Will Be Listed Below (If Applicable).     If you need a refill on your cardiac medications before your next appointment, please call your pharmacy.

## 2018-07-05 NOTE — Progress Notes (Signed)
07/05/2018 [name] [name]   1946/04/20  [id number]  Primary Physician [name], MD Primary Cardiologist: [name]  Reason for Visit/CC: Palpitations, Dyspnea and LEE  HPI:  [name] [name] is a 72 y.o. male, followed by [name], who has known CAD and h/o atrial fibrillation. He had CABG in 2000. He had a stent to the RCA in 2009. Last cath was in April 2019 and showed stent and grafts were patent. His last echocardiogram in 2017 showed normal LVEF at 55-60% and G1DD. He is on coumadin for a/c given h/o afib. He was on amiodarone at one point, but stopped taking it. Per patient's pharmacy it has not been filled since 2017.  He is here primarily for medication management. He wanted to see if he could just continue to stay off of amiodarone permanently. Dr. Beau Fanny recommended pt be evaluated with EKG before decision is made.   Today pt also complains of recent symptoms of intermittent palpitations, decreased exercise tolerance and exertional dyspnea, as well as bilateral LEE. He notes that he gets SOB walking to his mail box, which is new, but denies any CP. He attributes some of this to recent weight gain. He tires easily. No melena or hematochezia. His palpitations feel like "flutters" lasting secs to min at a time. Feels similar to prior afib episodes. EKG today shows NSR. There is trace bilateral ankle edema on exam. He is not on a diuretic. He reports full compliance with coumadin.     Current Meds  Medication Sig  . atorvastatin (LIPITOR) 40 MG tablet TAKE 1 & 1/2 TABLETS (60 MG TOTAL) BY MOUTH DAILY.  Marland Kitchen diclofenac sodium (VOLTAREN) 1 % GEL Apply 2 g topically daily as needed (for pain in affected area).  . divalproex (DEPAKOTE ER) 250 MG 24 hr tablet Take 3 tablets (750 mg total) by mouth at bedtime.  . gabapentin (NEURONTIN) 100 MG capsule Take 2 capsules (200 mg total) by mouth at bedtime.  Marland Kitchen glucose blood (ACCU-CHEK AVIVA) test strip Use as directed to check  blood sugar 4 times daily.  DX E11.9  . insulin aspart (NOVOLOG FLEXPEN) 100 UNIT/ML FlexPen 3 times a day (just before each meal) 65-45-65, and pen needles 3/day  . insulin NPH Human (HUMULIN N,NOVOLIN N) 100 UNIT/ML injection Inject 0.7 mLs (70 Units total) into the skin at bedtime. And syringes 1/day  . isosorbide mononitrate (IMDUR) 60 MG 24 hr tablet TAKE TWO TABLETS BY MOUTH DAILY  . levothyroxine (SYNTHROID, LEVOTHROID) 88 MCG tablet TAKE 1 TABLET (88 MCG TOTAL) BY MOUTH DAILY BEFORE BREAKFAST.  Marland Kitchen loperamide (IMODIUM A-D) 2 MG tablet Take 1 tablet (2 mg total) by mouth 4 (four) times daily as needed for diarrhea or loose stools.  . metoprolol succinate (TOPROL-XL) 50 MG 24 hr tablet TAKE 1&1/2 TABLETS BY MOUTH ONCE A DAY FOR A TOTAL OF 75 MG.  . midodrine (PROAMATINE) 10 MG tablet TAKE 1 TABLET (10 MG TOTAL) BY MOUTH 3 TIMES DAILY.  . nitroGLYCERIN (NITROSTAT) 0.4 MG SL tablet PLACE 1 TABLET (0.4 MG TOTAL) UNDER THE TONGUE EVERY 5 (FIVE) MINUTES AS NEEDED FOR CHEST PAIN.  Marland Kitchen ondansetron (ZOFRAN ODT) 4 MG disintegrating tablet Take 1 tablet (4 mg total) by mouth every 8 (eight) hours as needed for nausea or vomiting.  . pantoprazole (PROTONIX) 40 MG tablet Take 1 tablet (40 mg total) by mouth daily.  . pramipexole (MIRAPEX) 1 MG tablet TAKE 1/2 TO 1 TABLET BY MOUTH AT BEDTIME  . ranolazine (  RANEXA) 500 MG 12 hr tablet Take 1 tablet (500 mg total) by mouth 2 (two) times daily.  . sertraline (ZOLOFT) 100 MG tablet TAKE 1 TABLET (100 MG TOTAL) BY MOUTH 2 (TWO) TIMES DAILY.  Marland Kitchen Vitamin D, Ergocalciferol, (DRISDOL) 50000 units CAPS capsule TAKE 1 CAPSULE (50,000 UNITS TOTAL) BY MOUTH EVERY 7 (SEVEN) DAYS.  Marland Kitchen warfarin (COUMADIN) 5 MG tablet TAKE AS DIRECTED BY COUMADIN CLINIC   Allergies  Allergen Reactions  . Morphine Other (See Comments)    hallucinations  . Shellfish-Derived Products Itching   Past Medical History:  Diagnosis Date  . ACP (advance care planning) 05/11/2015  . Advance care  planning 05/01/2015  . Anemia    hemoglobin 7.4, iron deficiency, January, 2011, 2 unit transfusion, endoscopy normal, capsule endoscopy February, 2011 no small bowel abnormalities.   Most likely source gastric erosions, followed by GI  . Anxiety   . CAD (coronary artery disease)    A. CABG in 2000,status post cardiac cath in 2006, 2009 ....continued chest pain and SOB despite oral medication adjestments including Ranexa. B. Cath November 2009/ mRCA - 2.75 x 23 Abbott Xience V drug-eluting stent ...11/26/2008 to distal  RCA leading to acute marginal.  C. Cath 07/2012 for CP - stable anatomy, med rx. d. cath 2015 and 05/30/2015 stable anatomy, consider Myoview if has CP again  . Cerebral ischemia    MRI November, 2010, chronic microvascular ischemia  . Chronic diastolic CHF (congestive heart failure) ([place]) 03/13/2016  . Concussion   . Depression    Bipolar  . Diabetes mellitus type II, uncontrolled ([place])   . Diabetes mellitus without complication ([place])   . Diarrhea 02/15/2016  . Dizziness   . Edema   . Ejection fraction    EF 60%, echo, July 31, 2012  . Falling episodes    these have occurred in the past and again recurring 2011  . Family history of adverse reaction to anesthesia    "mother died during bypass surgery but not sure if it has to do with anesthesia"  . Gastric ulcer   . GERD (gastroesophageal reflux disease)   . H/O medication noncompliance    Due to loss of insurance  . Hard of hearing   . Headache 01/25/2016  . Hx of CABG    2000,  / one median sternotomy suture broken her chest x-ray November, 2010, no clinical significance  . Hyperlipidemia   . Hypertension    pt. denies  . Iron deficiency anemia   . Low back pain 06/12/2009   Qualifier: Diagnosis of  By: [name]   . Nausea with vomiting 01/25/2016  . Nephrolithiasis   . OSA (obstructive sleep apnea)   . Pain in limb 06/12/2009   Qualifier: Diagnosis of  By: [name]   . Palpitations    event  recorder showed sinus rhythm  . RBBB (right bundle branch block)   . Restless leg   . Right knee pain 01/07/2015  . Shortness of breath    CPX April, 2011, mild functional limitation, no clear pulmonary or cardiac limitation, possible deconditioning and mild chronotropic incompetence( peak heart rate 130)  . Spondylosis    C5-6, C6-7 MRI 2010  . Syncope 03/2016  . Thyroid disease   . Tubulovillous adenoma of colon 2007  . Vitamin D deficiency 05/17/2017  . Wears glasses    Family History  Problem Relation Age of Onset  . Pancreatic cancer Brother   . Diabetes Brother   .  Coronary artery disease Brother   . Stroke Brother   . Diabetes Brother   . Diabetes Mother   . Heart failure Mother   . Heart failure Father   . Hypothyroidism Brother   . Coronary artery disease Brother   . Other Brother        colon surgery  . Heart attack Unknown        Nephew  . Irregular heart beat Daughter   . Cancer Maternal Grandmother        unknown    Past Surgical History:  Procedure Laterality Date  . ANTERIOR CERVICAL DECOMP/DISCECTOMY FUSION N/A 05/31/2016   Procedure: ANTERIOR CERVICAL DECOMPRESSION/DISCECTOMY FUSION CERVICAL FIVE-SIX,CERVICAL SIX-SEVEN;  Surgeon: [name], MD;  Location: [place] [facility];  Service: Neurosurgery;  Laterality: N/A;  . CARDIAC CATHETERIZATION N/A 05/30/2015   Procedure: Left Heart Cath and Coronary Angiography;  Surgeon: [name], MD;  Location: [place] [facility];  Service: Cardiovascular;  Laterality: N/A;  . COLONOSCOPY    . CORONARY ARTERY BYPASS GRAFT     2000  . ESOPHAGOGASTRODUODENOSCOPY    . LEFT HEART CATH AND CORS/GRAFTS ANGIOGRAPHY N/A 12/15/2017   Procedure: LEFT HEART CATH AND CORS/GRAFTS ANGIOGRAPHY;  Surgeon: [name], MD;  Location: [place] [facility];  Service: Cardiovascular;  Laterality: N/A;  . LEFT HEART CATHETERIZATION WITH CORONARY/GRAFT ANGIOGRAM N/A 08/01/2012   Procedure: LEFT HEART CATHETERIZATION WITH Beatrix Fetters;  Surgeon: [name], MD;  Location: [facility] LAB;  Service: Cardiovascular;  Laterality: N/A;  . LEFT HEART CATHETERIZATION WITH CORONARY/GRAFT ANGIOGRAM N/A 01/03/2015   Procedure: LEFT HEART CATHETERIZATION WITH Beatrix Fetters;  Surgeon: [name], MD;  Location: [facility] C[facility] LAB;  Service: Cardiovascular;  Laterality: N/A;  . NASAL SEPTUM SURGERY     UP3  . PERCUTANEOUS CORONARY STENT INTERVENTION (PCI-S)  10/2008   mRCA PCI  2.75 x 23 Abbott Xience V drug-eluting stent   . ULTRASOUND GUIDANCE FOR VASCULAR ACCESS  12/15/2017   Procedure: Ultrasound Guidance For Vascular Access;  Surgeon: [name], MD;  Location: [place] CV LAB;  Service: Cardiovascular;;   Social History   Socioeconomic History  . Marital status: Married    Spouse name: Not on file  . Number of children: 4  . Years of education: 56  . Highest education level: Not on file  Occupational History  . Occupation: retired  Scientific laboratory technician  . Financial resource strain: Not on file  . Food insecurity:    Worry: Not on file    Inability: Not on file  . Transportation needs:    Medical: Not on file    Non-medical: Not on file  Tobacco Use  . Smoking status: Never Smoker  . Smokeless tobacco: Never Used  Substance and Sexual Activity  . Alcohol use: No    Alcohol/week: 0.0 oz    Comment: stopped drinking in 1998  . Drug use: No  . Sexual activity: Not on file  Lifestyle  . Physical activity:    Days per week: Not on file    Minutes per session: Not on file  . Stress: Not on file  Relationships  . Social connections:    Talks on phone: Not on file    Gets together: Not on file    Attends religious service: Not on file    Active member of club or organization: Not on file    Attends meetings of clubs or organizations: Not on file    Relationship status:  Not on file  . Intimate partner violence:    Fear of current or ex partner: Not on file    Emotionally abused: Not on  file    Physically abused: Not on file    Forced sexual activity: Not on file  Other Topics Concern  . Not on file  Social History Narrative   Patient is right handed.   Patient does not drink caffeine.     Review of Systems: General: negative for chills, fever, night sweats or weight changes.  Cardiovascular: negative for chest pain, dyspnea on exertion, edema, orthopnea, palpitations, paroxysmal nocturnal dyspnea or shortness of breath Dermatological: negative for rash Respiratory: negative for cough or wheezing Urologic: negative for hematuria Abdominal: negative for nausea, vomiting, diarrhea, bright red blood per rectum, melena, or hematemesis Neurologic: negative for visual changes, syncope, or dizziness All other systems reviewed and are otherwise negative except as noted above.   Physical Exam:  Blood pressure 126/66, pulse 68, height 5\' 9"  (1.753 m), weight 212 lb (96.2 kg).  General appearance: alert, cooperative and no distress Neck: no carotid bruit and no JVD Lungs: clear to auscultation bilaterally Heart: regular rate and rhythm, S1, S2 normal, no murmur, click, rub or gallop Extremities: extremities normal, atraumatic, no cyanosis or edema Pulses: 2+ and symmetric Skin: Skin color, texture, turgor normal. No rashes or lesions Neurologic: Grossly normal  EKG NSR, 68 bpm -- personally reviewed   ASSESSMENT AND PLAN:   72 y/o male with known CAD s/p CABG in 2000, followed by stent placement to the RCA in 2009. Last cath was in April 2019 and showed stent and grafts were patent. He is now here with multiple complaints of recent symptoms of intermittent palpitations, decreased exercise tolerance and exertional dyspnea, as well as bilateral LEE. He notes that he gets SOB walking to his mail box, which is new, but denies any CP. He attributes some of this to recent weight gain. He tires easily. No melena or hematochezia. His palpitations feel like "flutters" lasting secs  to min at a time. Feels similar to prior afib episodes. EKG today shows NSR. There is trace bilateral ankle edema on exam. He is not on a diuretic. He reports full compliance with coumadin. He self discontinued Amiodarone in 2017.   Exam today reveals RRR. NSR on EKG. Lungs are CTAB but there is trace bilateral LEE on exam. Given new symptoms outlined above, I'm concerned about recurrent PAF and possible acute on chronic diastolic HF. We will arrange for 2 week cardiac monitor to assess for recurrent afib (pt notes palpitations ~2-3x/ week). If recurrence, I would recommend that he restart amiodarone for rhythm control. He will continue on coumadin. We will also check a BNP today as well as a 2D echo. If suggestion of CHF, we will plan to start low dose diuretic. We will also check a BMP for baseline assessment of renal function and electrolytes. Since he has not had any associated CP and had recent cath in April, we will not persue any additional ischemic w/u at this time, unless his echo shows reduction of LVEF or new WMAs. He will f/u in 3-4 weeks, once studies are completed.    Follow-Up 3/4 weeks with myself or [name].   Brittainy Ladoris Gene, MHS [facility] 07/05/2018 4:43 PM

## 2018-07-07 MED FILL — NovoLIN N 100 UNIT/ML SUSP: 100 | 17 days supply | Qty: 10 | Fill #8

## 2018-07-10 MED FILL — LEVOTHYROXINE 88 MCG TABLET: 88 | 30 days supply | Qty: 30 | Fill #5

## 2018-07-12 ENCOUNTER — Ambulatory Visit (HOSPITAL_COMMUNITY): Payer: Medicare PPO | Attending: Cardiology

## 2018-07-12 ENCOUNTER — Other Ambulatory Visit: Payer: Self-pay

## 2018-07-12 ENCOUNTER — Other Ambulatory Visit: Payer: Self-pay | Admitting: Cardiology

## 2018-07-12 ENCOUNTER — Ambulatory Visit (INDEPENDENT_AMBULATORY_CARE_PROVIDER_SITE_OTHER): Payer: Medicare PPO

## 2018-07-12 DIAGNOSIS — I119 Hypertensive heart disease without heart failure: Secondary | ICD-10-CM | POA: Diagnosis not present

## 2018-07-12 DIAGNOSIS — Z951 Presence of aortocoronary bypass graft: Secondary | ICD-10-CM

## 2018-07-12 DIAGNOSIS — I480 Paroxysmal atrial fibrillation: Secondary | ICD-10-CM | POA: Insufficient documentation

## 2018-07-12 DIAGNOSIS — I25118 Atherosclerotic heart disease of native coronary artery with other forms of angina pectoris: Secondary | ICD-10-CM

## 2018-07-12 DIAGNOSIS — R002 Palpitations: Secondary | ICD-10-CM

## 2018-07-12 DIAGNOSIS — E119 Type 2 diabetes mellitus without complications: Secondary | ICD-10-CM | POA: Insufficient documentation

## 2018-07-12 DIAGNOSIS — R0609 Other forms of dyspnea: Secondary | ICD-10-CM

## 2018-07-12 NOTE — Progress Notes (Signed)
Pt has been made aware of normal result and verbalized understanding.  jw 07/12/18

## 2018-07-13 ENCOUNTER — Encounter: Payer: Self-pay | Admitting: Family Medicine

## 2018-07-13 ENCOUNTER — Ambulatory Visit (INDEPENDENT_AMBULATORY_CARE_PROVIDER_SITE_OTHER): Payer: Medicare PPO | Admitting: Family Medicine

## 2018-07-13 VITALS — BP 122/64 | HR 83 | Temp 98.1°F | Ht 69.0 in | Wt 211.5 lb

## 2018-07-13 DIAGNOSIS — R2243 Localized swelling, mass and lump, lower limb, bilateral: Secondary | ICD-10-CM

## 2018-07-13 DIAGNOSIS — M7989 Other specified soft tissue disorders: Secondary | ICD-10-CM

## 2018-07-13 LAB — COMPREHENSIVE METABOLIC PANEL
ALBUMIN: 4.3 g/dL (ref 3.5–5.2)
ALT: 21 U/L (ref 0–53)
AST: 18 U/L (ref 0–37)
Alkaline Phosphatase: 79 U/L (ref 39–117)
BILIRUBIN TOTAL: 0.7 mg/dL (ref 0.2–1.2)
BUN: 19 mg/dL (ref 6–23)
CALCIUM: 9.8 mg/dL (ref 8.4–10.5)
CO2: 29 mEq/L (ref 19–32)
CREATININE: 1.45 mg/dL (ref 0.40–1.50)
Chloride: 102 mEq/L (ref 96–112)
GFR: 51.16 mL/min — AB (ref >60.00)
Glucose, Bld: 115 mg/dL — ABNORMAL HIGH (ref 70–99)
Potassium: 4.4 mEq/L (ref 3.5–5.1)
Sodium: 138 mEq/L (ref 135–145)
Total Protein: 7.3 g/dL (ref 6.0–8.3)

## 2018-07-13 LAB — TSH: TSH: 2.52 u[IU]/mL (ref 0.35–4.50)

## 2018-07-13 LAB — T4, FREE: FREE T4: 0.9 ng/dL (ref 0.60–1.60)

## 2018-07-13 NOTE — Patient Instructions (Signed)
Mind the salt intake, stay physically active, elevate legs when able.  Wear compression stockings when able.  Give [place] 2-3 business days to get the results of your labs back.  Let us know if you need anything.

## 2018-07-13 NOTE — Progress Notes (Signed)
Pre visit review using our clinic review tool, if applicable. No additional management support is needed unless otherwise documented below in the visit note. 

## 2018-07-13 NOTE — Progress Notes (Signed)
Chief Complaint  Patient presents with  . Edema    Subjective: Patient is a 72 y.o. male here for swelling in ankles.  3 weeks of constant swelling in both ankles. Sometimes painful. Will affect up to knee. Echo yesterday neg. No hx of renal or hep failure. No recent change in diet or injury. No calf pain. Breathing OK, no CP. Does not have compression stockings.   ROS: Heart: Denies chest pain  Lungs: Denies SOB   Past Medical History:  Diagnosis Date  . ACP (advance care planning) 05/11/2015  . Advance care planning 05/01/2015  . Anemia    hemoglobin 7.4, iron deficiency, January, 2011, 2 unit transfusion, endoscopy normal, capsule endoscopy February, 2011 no small bowel abnormalities.   Most likely source gastric erosions, followed by GI  . Anxiety   . CAD (coronary artery disease)    A. CABG in 2000,status post cardiac cath in 2006, 2009 ....continued chest pain and SOB despite oral medication adjestments including Ranexa. B. Cath November 2009/ mRCA - 2.75 x 23 Abbott Xience V drug-eluting stent ...11/26/2008 to distal  RCA leading to acute marginal.  C. Cath 07/2012 for CP - stable anatomy, med rx. d. cath 2015 and 05/30/2015 stable anatomy, consider Myoview if has CP again  . Cerebral ischemia    MRI November, 2010, chronic microvascular ischemia  . Chronic diastolic CHF (congestive heart failure) ([place]) 03/13/2016  . Concussion   . Depression    Bipolar  . Diabetes mellitus type II, uncontrolled ([place])   . Diabetes mellitus without complication ([place])   . Diarrhea 02/15/2016  . Dizziness   . Edema   . Ejection fraction    EF 60%, echo, July 31, 2012  . Falling episodes    these have occurred in the past and again recurring 2011  . Family history of adverse reaction to anesthesia    "mother died during bypass surgery but not sure if it has to do with anesthesia"  . Gastric ulcer   . GERD (gastroesophageal reflux disease)   . H/O medication noncompliance    Due to loss of  insurance  . Hard of hearing   . Headache 01/25/2016  . Hx of CABG    2000,  / one median sternotomy suture broken her chest x-ray November, 2010, no clinical significance  . Hyperlipidemia   . Hypertension    pt. denies  . Iron deficiency anemia   . Low back pain 06/12/2009   Qualifier: Diagnosis of  By: [name]   . Nausea with vomiting 01/25/2016  . Nephrolithiasis   . OSA (obstructive sleep apnea)   . Pain in limb 06/12/2009   Qualifier: Diagnosis of  By: [name]   . Palpitations    event recorder showed sinus rhythm  . RBBB (right bundle branch block)   . Restless leg   . Right knee pain 01/07/2015  . Shortness of breath    CPX April, 2011, mild functional limitation, no clear pulmonary or cardiac limitation, possible deconditioning and mild chronotropic incompetence( peak heart rate 130)  . Spondylosis    C5-6, C6-7 MRI 2010  . Syncope 03/2016  . Thyroid disease   . Tubulovillous adenoma of colon 2007  . Vitamin D deficiency 05/17/2017  . Wears glasses     Objective: BP 122/64 (BP Location: Left Arm, Patient Position: Sitting, Cuff Size: Normal)   Pulse 83   Temp 98.1 F (36.7 C) (Oral)   Ht 5\' 9"  (1.753 m)  Wt 211 lb 8 oz (95.9 kg)   SpO2 95%   BMI 31.23 kg/m  General: Awake, appears stated age MSK: No calf ttp b/l Heart: RRR, no murmurs, 1+ pitting edema from ankles to prox 1/3 of tibia b/l Lungs: CTAB, no rales, wheezes or rhonchi. No accessory muscle use Psych: Age appropriate judgment and insight, normal affect and mood  Assessment and Plan: Localized swelling of both lower extremities - Plan: Comprehensive metabolic panel, TSH, T4, free  Orders as above. Ck labs. Rx for compression stockings given. F/u in 4 weeks with Dr.Blyth to recheck swelling. The patient voiced understanding and agreement to the plan.  [place], DO 07/13/18  2:10 PM

## 2018-07-17 ENCOUNTER — Ambulatory Visit (INDEPENDENT_AMBULATORY_CARE_PROVIDER_SITE_OTHER): Payer: Medicare PPO | Admitting: Family Medicine

## 2018-07-17 ENCOUNTER — Encounter: Payer: Self-pay | Admitting: Family Medicine

## 2018-07-17 VITALS — BP 124/70 | HR 67 | Temp 98.7°F | Resp 18 | Ht 67.0 in | Wt 206.0 lb

## 2018-07-17 DIAGNOSIS — I25118 Atherosclerotic heart disease of native coronary artery with other forms of angina pectoris: Secondary | ICD-10-CM | POA: Diagnosis not present

## 2018-07-17 DIAGNOSIS — I10 Essential (primary) hypertension: Secondary | ICD-10-CM

## 2018-07-17 DIAGNOSIS — Z794 Long term (current) use of insulin: Secondary | ICD-10-CM

## 2018-07-17 DIAGNOSIS — E034 Atrophy of thyroid (acquired): Secondary | ICD-10-CM

## 2018-07-17 DIAGNOSIS — R42 Dizziness and giddiness: Secondary | ICD-10-CM

## 2018-07-17 DIAGNOSIS — E782 Mixed hyperlipidemia: Secondary | ICD-10-CM

## 2018-07-17 DIAGNOSIS — E559 Vitamin D deficiency, unspecified: Secondary | ICD-10-CM | POA: Diagnosis not present

## 2018-07-17 DIAGNOSIS — E1165 Type 2 diabetes mellitus with hyperglycemia: Secondary | ICD-10-CM

## 2018-07-17 DIAGNOSIS — R609 Edema, unspecified: Secondary | ICD-10-CM | POA: Diagnosis not present

## 2018-07-17 DIAGNOSIS — E785 Hyperlipidemia, unspecified: Secondary | ICD-10-CM

## 2018-07-17 DIAGNOSIS — E119 Type 2 diabetes mellitus without complications: Secondary | ICD-10-CM

## 2018-07-17 LAB — LIPID PANEL
Cholesterol: 95 mg/dL (ref 0–200)
HDL: 25.7 mg/dL — AB (ref >39.00)
LDL CALC: 32 mg/dL (ref 0–99)
NonHDL: 69.3
TRIGLYCERIDES: 186 mg/dL — AB (ref 0.0–149.0)
Total CHOL/HDL Ratio: 4
VLDL: 37.2 mg/dL (ref 0.0–40.0)

## 2018-07-17 LAB — COMPREHENSIVE METABOLIC PANEL
ALT: 20 U/L (ref 0–53)
AST: 20 U/L (ref 0–37)
Albumin: 4.4 g/dL (ref 3.5–5.2)
Alkaline Phosphatase: 82 U/L (ref 39–117)
BUN: 22 mg/dL (ref 6–23)
CHLORIDE: 100 meq/L (ref 96–112)
CO2: 29 mEq/L (ref 19–32)
Calcium: 9.8 mg/dL (ref 8.4–10.5)
Creatinine, Ser: 1.53 mg/dL — ABNORMAL HIGH (ref 0.40–1.50)
GFR: 48.09 mL/min — AB (ref >60.00)
Glucose, Bld: 275 mg/dL — ABNORMAL HIGH (ref 70–99)
Potassium: 4.5 mEq/L (ref 3.5–5.1)
Sodium: 136 mEq/L (ref 135–145)
Total Bilirubin: 0.5 mg/dL (ref 0.2–1.2)
Total Protein: 7.2 g/dL (ref 6.0–8.3)

## 2018-07-17 LAB — CBC
HCT: 39.6 % (ref 39.0–52.0)
HEMOGLOBIN: 13.6 g/dL (ref 13.0–17.0)
MCHC: 34.3 g/dL (ref 30.0–36.0)
MCV: 86.8 fl (ref 78.0–100.0)
Platelets: 209 10*3/uL (ref 150.0–400.0)
RBC: 4.56 Mil/uL (ref 4.22–5.81)
RDW: 14.9 % (ref 11.5–15.5)
WBC: 8.7 10*3/uL (ref 4.0–10.5)

## 2018-07-17 LAB — TSH: TSH: 3.66 u[IU]/mL (ref 0.35–4.50)

## 2018-07-17 LAB — VITAMIN D 25 HYDROXY (VIT D DEFICIENCY, FRACTURES): VITD: 62.14 ng/mL (ref 30.00–100.00)

## 2018-07-17 LAB — HEMOGLOBIN A1C: Hgb A1c MFr Bld: 9.9 % — ABNORMAL HIGH (ref 4.6–6.5)

## 2018-07-17 MED ORDER — FUROSEMIDE 20 MG PO TABS: 20.0000 mg | ORAL_TABLET | Freq: Every day | ORAL | 3 refills | Status: DC | PRN

## 2018-07-17 NOTE — Assessment & Plan Note (Signed)
Well controlled, no changes to meds. Encouraged heart healthy diet such as the DASH diet and exercise as tolerated.  °

## 2018-07-17 NOTE — Assessment & Plan Note (Signed)
No cause found on echo. With some calf pain will rule out DVT which is unlikely with dopplers. He will minimize sodium use compression hose, stay active and elevate feet when sitting. eassess at next visit.
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
## 2018-07-17 NOTE — Assessment & Plan Note (Signed)
On Levothyroxine, continue to monitor 

## 2018-07-17 NOTE — Assessment & Plan Note (Signed)
Encouraged heart healthy diet, increase exercise, avoid trans fats, consider a krill oil cap daily 

## 2018-07-17 NOTE — Assessment & Plan Note (Signed)
He agrees to return to endocrinology but for now he has to increase his novolin N to 72 units bid from 70 bid and decrease carbs in diet

## 2018-07-17 NOTE — Patient Instructions (Addendum)
Novolin (N) 70 units twice  Novolog (R) 3 x daily with meals and at bedtime per sliding scale  FSG 150- 200 2 units          201 -250 4 units          251-300  6 units          301-350  8          351-400  10 units          401-450  12 units   Go home and try and find paperwork that tells you who did your MRI of your back and let us know so we can have you sign a release of records Edema Edema is an abnormal buildup of fluids in your bodytissues. Edema is somewhatdependent on gravity to pull the fluid to the lowest place in your body. That makes the condition more common in the legs and thighs (lower extremities). Painless swelling of the feet and ankles is common and becomes more likely as you get older. It is also common in looser tissues, like around your eyes. When the affected area is squeezed, the fluid may move out of that spot and leave a dent for a few moments. This dent is called pitting. What are the causes? There are many possible causes of edema. Eating too much salt and being on your feet or sitting for a long time can cause edema in your legs and ankles. Hot weather may make edema worse. Common medical causes of edema include:  Heart failure.  Liver disease.  Kidney disease.  Weak blood vessels in your legs.  Cancer.  An injury.  Pregnancy.  Some medications.  Obesity.  What are the signs or symptoms? Edema is usually painless.Your skin may look swollen or shiny. How is this diagnosed? Your health care provider may be able to diagnose edema by asking about your medical history and doing a physical exam. You may need to have tests such as X-rays, an electrocardiogram, or blood tests to check for medical conditions that may cause edema. How is this treated? Edema treatment depends on the cause. If you have heart, liver, or kidney disease, you need the treatment appropriate for these conditions. General treatment may include:  Elevation of the affected body  part above the level of your heart.  Compression of the affected body part. Pressure from elastic bandages or support stockings squeezes the tissues and forces fluid back into the blood vessels. This keeps fluid from entering the tissues.  Restriction of fluid and salt intake.  Use of a water pill (diuretic). These medications are appropriate only for some types of edema. They pull fluid out of your body and make you urinate more often. This gets rid of fluid and reduces swelling, but diuretics can have side effects. Only use diuretics as directed by your health care provider.  Follow these instructions at home:  Keep the affected body part above the level of your heart when you are lying down.  Do not sit still or stand for prolonged periods.  Do not put anything directly under your knees when lying down.  Do not wear constricting clothing or garters on your upper legs.  Exercise your legs to work the fluid back into your blood vessels. This may help the swelling go down.  Wear elastic bandages or support stockings to reduce ankle swelling as directed by your health care provider.  Eat a low-salt diet to reduce fluid if your health care  provider recommends it.  Only take medicines as directed by your health care provider. Contact a health care provider if:  Your edema is not responding to treatment.  You have heart, liver, or kidney disease and notice symptoms of edema.  You have edema in your legs that does not improve after elevating them.  You have sudden and unexplained weight gain. Get help right away if:  You develop shortness of breath or chest pain.  You cannot breathe when you lie down.  You develop pain, redness, or warmth in the swollen areas.  You have heart, liver, or kidney disease and suddenly get edema.  You have a fever and your symptoms suddenly get worse. This information is not intended to replace advice given to you by your health care provider. Make  sure you discuss any questions you have with your health care provider. Document Released: 11/22/2005 Document Revised: 04/29/2016 Document Reviewed: 09/14/2013 Elsevier Interactive Patient Education  2017 [employer].
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
## 2018-07-18 ENCOUNTER — Encounter: Payer: Self-pay | Admitting: Family Medicine

## 2018-07-18 MED ORDER — INSULIN NPH (HUMAN) (ISOPHANE) 100 UNIT/ML ~~LOC~~ SUSP: 72.0000 [IU] | Freq: Every day | SUBCUTANEOUS | 3 refills | Status: DC

## 2018-07-18 MED ORDER — INSULIN NPH (HUMAN) (ISOPHANE) 100 UNIT/ML ~~LOC~~ SUSP: 72.0000 [IU] | Freq: Two times a day (BID) | SUBCUTANEOUS | 0 refills | Status: DC

## 2018-07-18 NOTE — Addendum Note (Signed)
Addended by: Magdalene Molly A on: 07/18/2018 09:46 AM   Modules accepted: Orders

## 2018-07-18 NOTE — Addendum Note (Signed)
Addended by: Magdalene Molly A on: 07/18/2018 09:40 AM   Modules accepted: Orders

## 2018-07-19 ENCOUNTER — Ambulatory Visit (HOSPITAL_BASED_OUTPATIENT_CLINIC_OR_DEPARTMENT_OTHER)
Admission: RE | Admit: 2018-07-19 | Discharge: 2018-07-19 | Disposition: A | Discharge status: Home / Self Care | Payer: Medicare PPO | Source: Ambulatory Visit | Attending: Family Medicine | Admitting: Family Medicine

## 2018-07-19 DIAGNOSIS — I25118 Atherosclerotic heart disease of native coronary artery with other forms of angina pectoris: Secondary | ICD-10-CM | POA: Diagnosis not present

## 2018-07-19 DIAGNOSIS — R600 Localized edema: Secondary | ICD-10-CM | POA: Diagnosis not present

## 2018-07-19 DIAGNOSIS — R609 Edema, unspecified: Secondary | ICD-10-CM | POA: Diagnosis not present

## 2018-07-19 DIAGNOSIS — R42 Dizziness and giddiness: Secondary | ICD-10-CM

## 2018-07-19 DIAGNOSIS — E1165 Type 2 diabetes mellitus with hyperglycemia: Secondary | ICD-10-CM | POA: Insufficient documentation

## 2018-07-20 ENCOUNTER — Ambulatory Visit (INDEPENDENT_AMBULATORY_CARE_PROVIDER_SITE_OTHER): Payer: Medicare PPO | Admitting: Pulmonary Disease

## 2018-07-20 ENCOUNTER — Encounter: Payer: Self-pay | Admitting: Pulmonary Disease

## 2018-07-20 VITALS — BP 136/78 | HR 70 | Ht 69.0 in | Wt 208.1 lb

## 2018-07-20 DIAGNOSIS — Z9989 Dependence on other enabling machines and devices: Secondary | ICD-10-CM

## 2018-07-20 DIAGNOSIS — G4733 Obstructive sleep apnea (adult) (pediatric): Secondary | ICD-10-CM

## 2018-07-20 NOTE — Progress Notes (Signed)
   Subjective:    Patient ID: [name] [name], male    DOB: 1946-04-05, 72 y.o.   MRN: [id number]  HPI    Review of Systems  Constitutional: Positive for fatigue.  HENT: Negative.   Eyes: Negative.   Respiratory: Positive for shortness of breath.   Cardiovascular: Positive for chest pain, palpitations and leg swelling.  Gastrointestinal: Negative.   Endocrine: Negative.   Genitourinary: Negative.   Musculoskeletal: Positive for joint swelling.  Skin: Positive for rash.  Allergic/Immunologic: Negative.   Neurological: Positive for headaches.  Hematological: Negative.   Psychiatric/Behavioral: Positive for sleep disturbance.       Objective:   Physical Exam        Assessment & Plan:

## 2018-07-20 NOTE — Patient Instructions (Signed)
Will arrange for new home care company for CPAP supplies  Follow up in 1 year

## 2018-07-20 NOTE — Progress Notes (Signed)
[name]  Chief Complaint  Patient presents with  . Sleep Apnea    Constitutional: BP 136/78 (BP Location: Right Arm, Cuff Size: Normal)   Pulse 70   Ht 5\' 9"  (1.753 m)   Wt 208 lb 1.9 oz (94.4 kg)   SpO2 96%   BMI 30.73 kg/m   History of Present Illness: [name] [name] is a 72 y.o. male with obstructive sleep apnea.  He was previously seen by Dr. Corrie Dandy.  He was first found to have OSA in 2000.  He had UPPP.  Helped initially, but then had more trouble with his sleep again.  Most recent sleep study from 2017 showed mild to moderate sleep apnea.  He has an auto CPAP and has been using [facility] for his DME.  He has been frustrated with their service and watch to change to different DME in [place].  He has full face mask.  No issues with mask fit after this was recently changed.  Denies sinus congestion, dry mouth, or sore throat.  Feels that CPAP helps his sleep and daytime alertness.  Since getting new mask he is sleeping better.  Was having trouble with mask leak before.  CPAP download showed average AHI 8 with median CPAP 11 and 95 th percentile CPAP 14 cm H2O, but had mask leak.  Used on 73% of nights with 4 hrs per night.  Comprehensive Respiratory Exam:  Appearance - well kempt  ENMT - nasal mucosa moist, turbinates clear, midline nasal septum, poor dentition, no gingival bleeding, no oral exudates, s/p UPPP Neck - no masses, trachea midline, no thyromegaly, no elevation in JVP Respiratory - normal appearance of chest wall, normal respiratory effort w/o accessory muscle use, no dullness on percussion, no wheezing or rales CV - s1s2 regular rate and rhythm, no murmurs, no peripheral edema, radial pulses symmetric, heart monitor on GI - soft, non tender, no masses Lymph - no adenopathy noted in neck and axillary areas MSK - normal muscle strength and tone, normal gait Ext - no cyanosis, clubbing, or joint inflammation noted Skin - no rashes,  lesions, or ulcers Neuro - oriented to person, place, and time Psych - normal mood and affect   Assessment/Plan:  Obstructive sleep apnea. - he is compliant with CPAP and reports benefit from therapy - continue auto CPAP - will arrange for new DME in [place] - discussed importance of using CPAP for entire time he is asleep  Restless leg syndrome. - per primary care and neurology   Patient Instructions  Will arrange for new home care company for CPAP supplies  Follow up in 1 year    Chesley Mires, MD [place] 07/20/2018, 10:01 AM  Flow Sheet  Sleep tests: PSG 08/10/16 >> AHI 14.1, SpO2 low 88%  Cardiac tests: Echo 07/12/18 >> EF 55 to 60%  Review of Systems: Constitutional: Positive for fatigue.  HENT: Negative.   Eyes: Negative.   Respiratory: Positive for shortness of breath.   Cardiovascular: Positive for chest pain, palpitations and leg swelling.  Gastrointestinal: Negative.   Endocrine: Negative.   Genitourinary: Negative.   Musculoskeletal: Positive for joint swelling.  Skin: Positive for rash.  Allergic/Immunologic: Negative.   Neurological: Positive for headaches.  Hematological: Negative.   Psychiatric/Behavioral: Positive for sleep disturbance.   Past Medical History: He  has a past medical history of ACP (advance care planning) (05/11/2015), Acquired atrophy of thyroid (06/05/2013), Advance care planning (05/01/2015), Anemia, Anxiety, Arteriosclerosis of coronary  artery (05/01/2013), Atrial fibrillation ([place]) [I48.91] (07/20/2016), CAD (coronary artery disease), Carotid artery disease ([place]) (08/01/2015), Cerebral ischemia, Chronic diastolic CHF (congestive heart failure) ([place]) (03/13/2016), Concussion, Coronary artery disease involving native coronary artery, Depression, Diabetes mellitus type II, uncontrolled ([place]), Diabetes mellitus without complication ([place]), Diarrhea (02/15/2016), Dizziness, DM (diabetes mellitus), type 2 ([place]) (05/01/2013), Edema,  Ejection fraction, Essential hypertension (06/05/2013), Exertional dyspnea (05/14/2016), Falling episodes, Family history of adverse reaction to anesthesia, Gastric ulcer, GERD (gastroesophageal reflux disease), H/O medication noncompliance, Hard of hearing, Headache (01/25/2016), History of blood transfusion (12/20/2013), CABG, Hyperlipidemia, Hypertension, Iron deficiency anemia, Long term (current) use of anticoagulants [Z79.01] (07/20/2016), Low back pain (06/12/2009), Nausea with vomiting (01/25/2016), Nephrolithiasis, OSA (obstructive sleep apnea), Pain in limb (06/12/2009), Palpitations, RBBB (07/09/2009), RBBB (right bundle branch block), Restless leg, Right knee pain (01/07/2015), RLS (restless legs syndrome) (09/19/2009), Seizure disorder ([place]) (01/09/2017), Shortness of breath, Spondylosis, Syncope (03/2016), Thyroid disease, Tubulovillous adenoma of colon (2007), Vitamin D deficiency (05/17/2017), and Wears glasses.  Past Surgical History: He  has a past surgical history that includes Nasal septum surgery; Coronary artery bypass graft; left heart catheterization with coronary/graft angiogram (N/A, 08/01/2012); left heart catheterization with coronary/graft angiogram (N/A, 01/03/2015); Percutaneous coronary stent intervention (pci-s) (10/2008); Cardiac catheterization (N/A, 05/30/2015); Colonoscopy; Esophagogastroduodenoscopy; Anterior cervical decomp/discectomy fusion (N/A, 05/31/2016); LEFT HEART CATH AND CORS/GRAFTS ANGIOGRAPHY (N/A, 12/15/2017); and Ultrasound guidance for vascular access (12/15/2017).  Family History: His family history includes Cancer in his maternal grandmother; Coronary artery disease in his brother and brother; Diabetes in his brother, brother, and mother; Heart attack in his unknown relative; Heart failure in his father and mother; Hypothyroidism in his brother; Irregular heart beat in his daughter; Other in his brother; Pancreatic cancer in his brother; Stroke in his brother.  Social  History: He  reports that he has never smoked. He has never used smokeless tobacco. He reports that he does not drink alcohol or use drugs.  Medications: Allergies as of 07/20/2018      Reactions   Morphine Other (See Comments)   hallucinations   Shellfish-derived Products Itching      Medication List        Accurate as of 07/20/18 10:01 AM. Always use your most recent med list.          amiodarone 400 MG tablet Commonly known as:  PACERONE Take 200 mg by mouth daily.   atorvastatin 40 MG tablet Commonly known as:  LIPITOR TAKE 1 & 1/2 TABLETS (60 MG TOTAL) BY MOUTH DAILY.   diclofenac sodium 1 % Gel Commonly known as:  VOLTAREN Apply 2 g topically daily as needed (for pain in affected area).   divalproex 250 MG 24 hr tablet Commonly known as:  DEPAKOTE ER Take 3 tablets (750 mg total) by mouth at bedtime.   furosemide 20 MG tablet Commonly known as:  LASIX Take 1 tablet (20 mg total) by mouth daily as needed for edema.   gabapentin 100 MG capsule Commonly known as:  NEURONTIN Take 2 capsules (200 mg total) by mouth at bedtime.   glucose blood test strip Use as directed to check blood sugar 4 times daily.  DX E11.9   insulin aspart 100 UNIT/ML FlexPen Commonly known as:  NOVOLOG 3 times a day (just before each meal) 65-45-65, and pen needles 3/day   insulin NPH Human 100 UNIT/ML injection Commonly known as:  HUMULIN N,NOVOLIN N Inject 0.72 mLs (72 Units total) into the skin 2 (two) times daily. And syringes 1/day   isosorbide mononitrate  60 MG 24 hr tablet Commonly known as:  IMDUR TAKE TWO TABLETS BY MOUTH DAILY   levothyroxine 88 MCG tablet Commonly known as:  SYNTHROID, LEVOTHROID TAKE 1 TABLET (88 MCG TOTAL) BY MOUTH DAILY BEFORE BREAKFAST.   loperamide 2 MG tablet Commonly known as:  IMODIUM A-D Take 1 tablet (2 mg total) by mouth 4 (four) times daily as needed for diarrhea or loose stools.   metoprolol succinate 50 MG 24 hr tablet Commonly known  as:  TOPROL-XL TAKE 1&1/2 TABLETS BY MOUTH ONCE A DAY FOR A TOTAL OF 75 MG.   midodrine 10 MG tablet Commonly known as:  PROAMATINE TAKE 1 TABLET (10 MG TOTAL) BY MOUTH 3 TIMES DAILY.   nitroGLYCERIN 0.4 MG SL tablet Commonly known as:  NITROSTAT PLACE 1 TABLET (0.4 MG TOTAL) UNDER THE TONGUE EVERY 5 (FIVE) MINUTES AS NEEDED FOR CHEST PAIN.   ondansetron 4 MG disintegrating tablet Commonly known as:  ZOFRAN-ODT Take 1 tablet (4 mg total) by mouth every 8 (eight) hours as needed for nausea or vomiting.   pantoprazole 40 MG tablet Commonly known as:  PROTONIX Take 1 tablet (40 mg total) by mouth daily.   pramipexole 1 MG tablet Commonly known as:  MIRAPEX TAKE 1/2 TO 1 TABLET BY MOUTH AT BEDTIME   ranolazine 500 MG 12 hr tablet Commonly known as:  RANEXA Take 1 tablet (500 mg total) by mouth 2 (two) times daily.   sertraline 100 MG tablet Commonly known as:  ZOLOFT TAKE 1 TABLET (100 MG TOTAL) BY MOUTH 2 (TWO) TIMES DAILY.   Vitamin D (Ergocalciferol) 50000 units Caps capsule Commonly known as:  DRISDOL TAKE 1 CAPSULE (50,000 UNITS TOTAL) BY MOUTH EVERY 7 (SEVEN) DAYS.   warfarin 5 MG tablet Commonly known as:  COUMADIN Take as directed by the anticoagulation clinic. If you are unsure how to take this medication, talk to your nurse or doctor. Original instructions:  TAKE AS DIRECTED BY COUMADIN CLINIC

## 2018-07-25 MED FILL — FUROSEMIDE 20 MG TAB: 20 | 30 days supply | Qty: 30 | Fill #0

## 2018-07-26 ENCOUNTER — Ambulatory Visit (INDEPENDENT_AMBULATORY_CARE_PROVIDER_SITE_OTHER): Payer: Medicare PPO

## 2018-07-26 DIAGNOSIS — I480 Paroxysmal atrial fibrillation: Secondary | ICD-10-CM | POA: Diagnosis not present

## 2018-07-26 DIAGNOSIS — Z7901 Long term (current) use of anticoagulants: Secondary | ICD-10-CM | POA: Diagnosis not present

## 2018-07-26 DIAGNOSIS — I4891 Unspecified atrial fibrillation: Secondary | ICD-10-CM

## 2018-07-26 DIAGNOSIS — Z5181 Encounter for therapeutic drug level monitoring: Secondary | ICD-10-CM | POA: Diagnosis not present

## 2018-07-26 LAB — POCT INR: INR: 3.5 — AB (ref 2.0–3.0)

## 2018-07-26 NOTE — Patient Instructions (Signed)
Description   Skip today's dosage of Coumadin, then resume same dosage 1 tablet daily.  Recheck in 3 weeks. Call us with any medication changes [phone] [facility]. Main # [phone]. Increase intake of greens to 2 servings a week

## 2018-07-31 ENCOUNTER — Emergency Department (HOSPITAL_COMMUNITY)
Admission: EM | Admit: 2018-07-31 | Discharge: 2018-07-31 | Disposition: A | Discharge status: Home / Self Care | Payer: Medicare PPO | Attending: Emergency Medicine | Admitting: Emergency Medicine

## 2018-07-31 ENCOUNTER — Emergency Department (HOSPITAL_COMMUNITY): Payer: Medicare PPO

## 2018-07-31 ENCOUNTER — Encounter (HOSPITAL_COMMUNITY): Payer: Self-pay

## 2018-07-31 DIAGNOSIS — M542 Cervicalgia: Secondary | ICD-10-CM | POA: Diagnosis not present

## 2018-07-31 DIAGNOSIS — I5032 Chronic diastolic (congestive) heart failure: Secondary | ICD-10-CM | POA: Insufficient documentation

## 2018-07-31 DIAGNOSIS — Z794 Long term (current) use of insulin: Secondary | ICD-10-CM | POA: Insufficient documentation

## 2018-07-31 DIAGNOSIS — I2510 Atherosclerotic heart disease of native coronary artery without angina pectoris: Secondary | ICD-10-CM | POA: Diagnosis not present

## 2018-07-31 DIAGNOSIS — Z951 Presence of aortocoronary bypass graft: Secondary | ICD-10-CM | POA: Diagnosis not present

## 2018-07-31 DIAGNOSIS — S0990XA Unspecified injury of head, initial encounter: Secondary | ICD-10-CM

## 2018-07-31 DIAGNOSIS — S199XXA Unspecified injury of neck, initial encounter: Secondary | ICD-10-CM | POA: Diagnosis not present

## 2018-07-31 DIAGNOSIS — S0081XA Abrasion of other part of head, initial encounter: Secondary | ICD-10-CM | POA: Insufficient documentation

## 2018-07-31 DIAGNOSIS — Y929 Unspecified place or not applicable: Secondary | ICD-10-CM | POA: Insufficient documentation

## 2018-07-31 DIAGNOSIS — I4510 Unspecified right bundle-branch block: Secondary | ICD-10-CM | POA: Diagnosis not present

## 2018-07-31 DIAGNOSIS — R42 Dizziness and giddiness: Secondary | ICD-10-CM | POA: Diagnosis not present

## 2018-07-31 DIAGNOSIS — I110 Hypertensive heart disease with heart failure: Secondary | ICD-10-CM | POA: Diagnosis not present

## 2018-07-31 DIAGNOSIS — M545 Low back pain: Secondary | ICD-10-CM | POA: Diagnosis not present

## 2018-07-31 DIAGNOSIS — Y9389 Activity, other specified: Secondary | ICD-10-CM | POA: Insufficient documentation

## 2018-07-31 DIAGNOSIS — E119 Type 2 diabetes mellitus without complications: Secondary | ICD-10-CM | POA: Insufficient documentation

## 2018-07-31 DIAGNOSIS — W19XXXA Unspecified fall, initial encounter: Secondary | ICD-10-CM | POA: Diagnosis not present

## 2018-07-31 DIAGNOSIS — R51 Headache: Secondary | ICD-10-CM | POA: Diagnosis not present

## 2018-07-31 DIAGNOSIS — S3992XA Unspecified injury of lower back, initial encounter: Secondary | ICD-10-CM | POA: Diagnosis not present

## 2018-07-31 DIAGNOSIS — Y998 Other external cause status: Secondary | ICD-10-CM | POA: Diagnosis not present

## 2018-07-31 DIAGNOSIS — W01198A Fall on same level from slipping, tripping and stumbling with subsequent striking against other object, initial encounter: Secondary | ICD-10-CM | POA: Diagnosis not present

## 2018-07-31 DIAGNOSIS — R0602 Shortness of breath: Secondary | ICD-10-CM | POA: Diagnosis not present

## 2018-07-31 DIAGNOSIS — R0902 Hypoxemia: Secondary | ICD-10-CM | POA: Diagnosis not present

## 2018-07-31 LAB — CBG MONITORING, ED: GLUCOSE-CAPILLARY: 234 mg/dL — AB (ref 70–99)

## 2018-07-31 LAB — BASIC METABOLIC PANEL
ANION GAP: 11 (ref 5–15)
BUN: 33 mg/dL — ABNORMAL HIGH (ref 8–23)
CALCIUM: 8.9 mg/dL (ref 8.9–10.3)
CO2: 24 mmol/L (ref 22–32)
Chloride: 99 mmol/L (ref 98–111)
Creatinine, Ser: 1.83 mg/dL — ABNORMAL HIGH (ref 0.61–1.24)
GFR, EST AFRICAN AMERICAN: 42 mL/min — AB (ref >60)
GFR, EST NON AFRICAN AMERICAN: 36 mL/min — AB (ref >60)
Glucose, Bld: 227 mg/dL — ABNORMAL HIGH (ref 70–99)
Potassium: 4.1 mmol/L (ref 3.5–5.1)
SODIUM: 134 mmol/L — AB (ref 135–145)

## 2018-07-31 LAB — CBC
HCT: 41.2 % (ref 39.0–52.0)
Hemoglobin: 14.8 g/dL (ref 13.0–17.0)
MCH: 30.5 pg (ref 26.0–34.0)
MCHC: 35.9 g/dL (ref 30.0–36.0)
MCV: 84.9 fL (ref 78.0–100.0)
Platelets: 161 10*3/uL (ref 150–400)
RBC: 4.85 MIL/uL (ref 4.22–5.81)
RDW: 14 % (ref 11.5–15.5)
WBC: 7.7 10*3/uL (ref 4.0–10.5)

## 2018-07-31 MED ORDER — SODIUM CHLORIDE 0.9 % IV BOLUS
500.0000 mL | Freq: Once | INTRAVENOUS | Status: AC
  Administered 2018-07-31: 500 mL via INTRAVENOUS

## 2018-07-31 NOTE — ED Triage Notes (Signed)
Pt. Came by EMS from home after bending down to pick up an item and felt dizzy and fell forward hitting forehead. Pt. And family is not sure if there was a LOC. Pt. Obtained an abrasion to center of forehead. Pt. Is on warfarin. Pt. Reports dizziness for the past couple of days. Pt. Was ortho + with EMS. Pt. Had neck surgery a couple years ago and reports hearing a pop when he hit his head.  C collar in place and aligned.   A/O X4 CBG 209

## 2018-07-31 NOTE — Discharge Instructions (Addendum)
Apply ice to help with the swelling, take over-the-counter medications as needed for pain

## 2018-07-31 NOTE — ED Provider Notes (Signed)
[facility] EMERGENCY DEPARTMENT Provider Note   CSN: [id number] Arrival date & time: 07/31/18  2101     History   Chief Complaint Fall, head injury  HPI [name] [name] is a 72 y.o. male.  HPI Pt was leaning over to pick something up at home when he ended up falling forward onto the floor.   Pt hit his head on the floor.  Pt thinks he may have lost consciousness.   He also heard a pop in his neck and is having pain in his lower back and neck.  He denies any numbness or weakness.  No fevers or chills.  No vomiting or diarrhea. Past Medical History:  Diagnosis Date  . ACP (advance care planning) 05/11/2015  . Acquired atrophy of thyroid 06/05/2013   Overview:  July 2014: controlled on synthroid 23mcg since 2012 May 2015: decreased to Synthroid 50 mcg  Last Assessment & Plan:  Pt doing well with most recent TSH within normal range.  Will continue current dosage of Synthroid 22mcg. Pt reminded to take medication on empty stomach. Will continue to monitor with periodic laboratory assessment in 3 months.   . Advance care planning 05/01/2015  . Anemia    hemoglobin 7.4, iron deficiency, January, 2011, 2 unit transfusion, endoscopy normal, capsule endoscopy February, 2011 no small bowel abnormalities.   Most likely source gastric erosions, followed by GI  . Anxiety   . Arteriosclerosis of coronary artery 05/01/2013   Overview:  July 7741: Bystolic, Simvastatin, Imdur, ASA, and NTG CABG x 4 in 2000, stent placed 02/06/1998, 11/26/2008 Sep 2878: Changed Bystolic 10 to atenolol 25mg  (for cost), Continue Simvastatin, Imdur, ASA, and NTG  Last Assessment & Plan:  Change bystolic to atenolol (for cost) and continue others as pt doing well.   . Atrial fibrillation ([place]) [I48.91] 07/20/2016  . CAD (coronary artery disease)    A. CABG in 2000,status post cardiac cath in 2006, 2009 ....continued chest pain and SOB despite oral medication adjestments including Ranexa. B. Cath November 2009/  mRCA - 2.75 x 23 Abbott Xience V drug-eluting stent ...11/26/2008 to distal  RCA leading to acute marginal.  C. Cath 07/2012 for CP - stable anatomy, med rx. d. cath 2015 and 05/30/2015 stable anatomy, consider Myoview if has CP again  . Carotid artery disease ([place]) 08/01/2015   Doppler, May 29, 2015, 1-39% bilateral ICA   . Cerebral ischemia    MRI November, 2010, chronic microvascular ischemia  . Chronic diastolic CHF (congestive heart failure) ([place]) 03/13/2016  . Concussion   . Coronary artery disease involving native coronary artery    status post coronary artery bypass graft in 2000,status post cardiac cath in 2006,  2009 ....continued chest pain and SOB despite oral medication adjestments including Ranexa.   Catheter November 2009/ DES  Stent - RCA...--> 11/26/2008 to distal  RCA leading to acute marginal.   Cath 01/26/2010 continued patentcy grafts and stent to RCA , Modest progression of distal disease...medical Rx/  /  Hospi  . Depression    Bipolar  . Diabetes mellitus type II, uncontrolled ([name])   . Diabetes mellitus without complication ([name])   . Diarrhea 02/15/2016  . Dizziness   . DM (diabetes mellitus), type 2 ([name]) 05/01/2013   Overview:  July 2014: metformin, Lantus & Novolog. Aug 2014: 40u Lantus nightly, 20-25u of Novolog prior to meals with metformin. Added Oseni 25mg /15mg  (worked but cost prohibitive) Jun 2015: 40u Lantus nightly, 10-15u of Novolog prior to meals with metformin.  September 2015: Samples of Oseni 15/25 given for 28 days, referral to endocrinology Nov 2015: Samples of Toujeo give (4.63ml)  Last Assessme  . Edema   . Ejection fraction    EF 60%, echo, July 31, 2012  . Essential hypertension 06/05/2013   Overview:  July 2014: Controlled with Bystolic Sep 5465: Changed to atenolol.  Last Assessment & Plan:  Will change to atenolol for cost and follow Improved.  Medication compliance strongly encouraged BP: 116/64 mmHg    Overview:  July 2014: Controlled with Bystolic Sep  6812: Changed to atenolol.  Last Assessment & Plan:  Will change to atenolol for cost and follow  . Exertional dyspnea 05/14/2016  . Falling episodes    these have occurred in the past and again recurring 2011  . Family history of adverse reaction to anesthesia    "mother died during bypass surgery but not sure if it has to do with anesthesia"  . Gastric ulcer   . GERD (gastroesophageal reflux disease)   . H/O medication noncompliance    Due to loss of insurance  . Hard of hearing   . Headache 01/25/2016  . History of blood transfusion 12/20/2013  . Hx of CABG    2000,  / one median sternotomy suture broken her chest x-ray November, 2010, no clinical significance  . Hyperlipidemia   . Hypertension    pt. denies  . Iron deficiency anemia   . Long term (current) use of anticoagulants [Z79.01] 07/20/2016  . Low back pain 06/12/2009   Qualifier: Diagnosis of  By: [name]   . Nausea with vomiting 01/25/2016  . Nephrolithiasis   . OSA (obstructive sleep apnea)   . Pain in limb 06/12/2009   Qualifier: Diagnosis of  By: [name]   . Palpitations    event recorder showed sinus rhythm  . RBBB 07/09/2009   Qualifier: Diagnosis of  By: [name], MD, [name] [name]   . RBBB (right bundle branch block)   . Restless leg   . Right knee pain 01/07/2015  . RLS (restless legs syndrome) 09/19/2009   Qualifier: Diagnosis of  By: [name]   Overview:  July 2014: Controlled with Mirapex  Last Assessment & Plan:  Patient is doing well. Will continue current management and follow clinically.  . Seizure disorder ([name]) 01/09/2017  . Shortness of breath    CPX April, 2011, mild functional limitation, no clear pulmonary or cardiac limitation, possible deconditioning and mild chronotropic incompetence( peak heart rate 130)  . Spondylosis    C5-6, C6-7 MRI 2010  . Syncope 03/2016  . Thyroid disease   . Tubulovillous adenoma of colon 2007  . Vitamin D deficiency 05/17/2017  . Wears glasses       Patient Active Problem List   Diagnosis Date Noted  . Localized swelling of both lower extremities 07/13/2018  . Cervical radiculopathy at C8 02/08/2018  . Hypersomnolence 10/18/2017  . Vitamin D deficiency 05/17/2017  . Seizure disorder ([place]) 01/09/2017  . Atrial fibrillation ([place]) [I48.91] 07/20/2016  . Long term (current) use of anticoagulants [Z79.01] 07/20/2016  . Foraminal stenosis of cervical region 05/31/2016  . Exertional dyspnea 05/14/2016  . Chronic diastolic CHF (congestive heart failure) ([place]) 03/13/2016  . Headache 01/25/2016  . Carotid artery disease ([place]) 08/01/2015  . Spinal stenosis in cervical region 07/30/2015  . Occipital neuralgia of right side 07/15/2015  . Cervicogenic headache 07/15/2015  . Dizziness   . Chest pain 05/29/2015  .  Medicare annual wellness visit, initial 05/11/2015  . Sun-damaged skin 05/11/2015  . Advance care planning 05/01/2015  . Post-traumatic headache 02/18/2015  . Pain in both knees 01/07/2015  . Recurrent pain of right knee 05/10/2014  . Personal history of ongoing treatment with alendronate (Fosamax) 12/20/2013  . History of blood transfusion 12/20/2013  . Hyperlipidemia LDL goal <70 06/27/2013  . HLD (hyperlipidemia) 06/27/2013  . Essential hypertension 06/05/2013  . Arthritis of hand, degenerative 06/05/2013  . Acquired atrophy of thyroid 06/05/2013  . Gastro-esophageal reflux disease without esophagitis 06/05/2013  . Hypothyroidism due to acquired atrophy of thyroid 06/05/2013  . Osteoarthritis of hand 06/05/2013  . DM (diabetes mellitus), type 2 ([place]) 05/01/2013  . Arteriosclerosis of coronary artery 05/01/2013  . Benign prostatic hyperplasia with urinary obstruction 05/01/2013  . BPH with obstruction/lower urinary tract symptoms 05/01/2013  . CAD (coronary artery disease) 05/01/2013  . Abdominal pain, unspecified site 03/06/2013  . Right shoulder pain 10/24/2012  . Eczema 10/24/2012  . Bilateral groin pain  09/21/2012  . Ejection fraction   . Diabetes mellitus type II, uncontrolled ([place]) 08/31/2011  . Coronary artery disease involving native coronary artery   . Anemia   . OSA (obstructive sleep apnea)   . Edema   . Falling episodes   . Hx of CABG   . Palpitations   . Shortness of breath   . Insomnia 04/26/2011  . FATIGUE 12/10/2010  . RLS (restless legs syndrome) 09/19/2009  . Depression 07/09/2009  . RBBB 07/09/2009  . Low back pain 06/12/2009  . SCIATICA 01/17/2009  . GERD (gastroesophageal reflux disease) 01/09/2009  . TUBULOVILLOUS ADENOMA, COLON, HX OF 01/08/2009  . Presence of drug coated stent in right coronary artery 10/29/2008  . NEPHROLITHIASIS, HX OF 12/20/2007    Past Surgical History:  Procedure Laterality Date  . ANTERIOR CERVICAL DECOMP/DISCECTOMY FUSION N/A 05/31/2016   Procedure: ANTERIOR CERVICAL DECOMPRESSION/DISCECTOMY FUSION CERVICAL FIVE-SIX,CERVICAL SIX-SEVEN;  Surgeon: [name], MD;  Location: [facility];  Service: Neurosurgery;  Laterality: N/A;  . CARDIAC CATHETERIZATION N/A 05/30/2015   Procedure: Left Heart Cath and Coronary Angiography;  Surgeon: [name], MD;  Location: [facility];  Service: Cardiovascular;  Laterality: N/A;  . COLONOSCOPY    . CORONARY ARTERY BYPASS GRAFT     2000  . ESOPHAGOGASTRODUODENOSCOPY    . LEFT HEART CATH AND CORS/GRAFTS ANGIOGRAPHY N/A 12/15/2017   Procedure: LEFT HEART CATH AND CORS/GRAFTS ANGIOGRAPHY;  Surgeon: [name], MD;  Location: [facility];  Service: Cardiovascular;  Laterality: N/A;  . LEFT HEART CATHETERIZATION WITH CORONARY/GRAFT ANGIOGRAM N/A 08/01/2012   Procedure: LEFT HEART CATHETERIZATION WITH Beatrix Fetters;  Surgeon: [name], MD;  Location: [facility] CATH LAB;  Service: Cardiovascular;  Laterality: N/A;  . LEFT HEART CATHETERIZATION WITH CORONARY/GRAFT ANGIOGRAM N/A 01/03/2015   Procedure: LEFT HEART CATHETERIZATION WITH Beatrix Fetters;  Surgeon:  [name], MD;  Location: [facility] CATH LAB;  Service: Cardiovascular;  Laterality: N/A;  . NASAL SEPTUM SURGERY     UP3  . PERCUTANEOUS CORONARY STENT INTERVENTION (PCI-S)  10/2008   mRCA PCI  2.75 x 23 Abbott Xience V drug-eluting stent   . ULTRASOUND GUIDANCE FOR VASCULAR ACCESS  12/15/2017   Procedure: Ultrasound Guidance For Vascular Access;  Surgeon: [name], MD;  Location: [place] CV LAB;  Service: Cardiovascular;;        Home Medications    Prior to Admission medications   Medication Sig Start Date End Date Taking? Authorizing  Provider  amiodarone (PACERONE) 400 MG tablet Take 200 mg by mouth daily.   Yes [provider]  atorvastatin (LIPITOR) 40 MG tablet TAKE 1 & 1/2 TABLETS (60 MG TOTAL) BY MOUTH DAILY. Patient taking differently: Take 60 mg by mouth daily at 6 PM.  05/02/18  Yes [name], MD  diclofenac sodium (VOLTAREN) 1 % GEL Apply 2 g topically daily as needed (for pain in affected area). 05/29/18  Yes [name], MD  divalproex (DEPAKOTE ER) 250 MG 24 hr tablet Take 3 tablets (750 mg total) by mouth at bedtime. 04/20/18  Yes Jaffe, Adam R, DO  furosemide (LASIX) 20 MG tablet Take 1 tablet (20 mg total) by mouth daily as needed for edema. 07/17/18  Yes [name], MD  gabapentin (NEURONTIN) 100 MG capsule Take 2 capsules (200 mg total) by mouth at bedtime. 02/08/18  Yes Hulan Saas M, DO  insulin aspart (NOVOLOG FLEXPEN) 100 UNIT/ML FlexPen 3 times a day (just before each meal) 65-45-65, and pen needles 3/day Patient taking differently: Inject 45-65 Units into the skin See admin instructions. Use 65 units every morning, use 45 units at lunch and use 65 units at dinner 03/09/18  Yes Renato Shin, MD  insulin NPH Human (HUMULIN N,NOVOLIN N) 100 UNIT/ML injection Inject 0.72 mLs (72 Units total) into the skin 2 (two) times daily. And syringes 1/day 07/18/18  Yes [name], MD  isosorbide mononitrate (IMDUR) 60 MG 24 hr tablet TAKE  TWO TABLETS BY MOUTH DAILY Patient taking differently: Take 120 mg by mouth daily.  06/16/18  Yes [name], MD  levothyroxine (SYNTHROID, LEVOTHROID) 88 MCG tablet TAKE 1 TABLET (88 MCG TOTAL) BY MOUTH DAILY BEFORE BREAKFAST. 11/07/17  Yes [name], MD  metoprolol succinate (TOPROL-XL) 50 MG 24 hr tablet TAKE 1&1/2 TABLETS BY MOUTH ONCE A DAY FOR A TOTAL OF 75 MG. Patient taking differently: Take 75 mg by mouth daily.  03/29/18  Yes Simmons, Brittainy M, PA-C  midodrine (PROAMATINE) 10 MG tablet TAKE 1 TABLET (10 MG TOTAL) BY MOUTH 3 TIMES DAILY. Patient taking differently: Take 10 mg by mouth 3 (three) times daily.  06/27/18  Yes [name], MD  nitroGLYCERIN (NITROSTAT) 0.4 MG SL tablet PLACE 1 TABLET (0.4 MG TOTAL) UNDER THE TONGUE EVERY 5 (FIVE) MINUTES AS NEEDED FOR CHEST PAIN. 06/08/18  Yes [name], MD  pantoprazole (PROTONIX) 40 MG tablet Take 1 tablet (40 mg total) by mouth daily. 06/27/18  Yes [name], MD  pramipexole (MIRAPEX) 1 MG tablet TAKE 1/2 TO 1 TABLET BY MOUTH AT BEDTIME Patient taking differently: Take 1 mg by mouth at bedtime.  06/23/18  Yes [name], MD  ranolazine (RANEXA) 500 MG 12 hr tablet Take 1 tablet (500 mg total) by mouth 2 (two) times daily. 01/20/18  Yes Isaiah Serge, NP  sertraline (ZOLOFT) 100 MG tablet TAKE 1 TABLET (100 MG TOTAL) BY MOUTH 2 (TWO) TIMES DAILY. 02/02/18  Yes [name], MD  Vitamin D, Ergocalciferol, (DRISDOL) 50000 units CAPS capsule TAKE 1 CAPSULE (50,000 UNITS TOTAL) BY MOUTH EVERY 7 (SEVEN) DAYS. 12/09/17  Yes [name], MD  warfarin (COUMADIN) 5 MG tablet TAKE AS DIRECTED BY COUMADIN CLINIC Patient taking differently: Take 5 mg by mouth at bedtime.  06/23/18  Yes [name], MD  glucose blood (ACCU-CHEK AVIVA) test strip Use as directed to check blood sugar 4 times daily.  DX E11.9 10/10/17  [name], MD  loperamide (IMODIUM A-D) 2 MG tablet Take 1 tablet (2 mg total) by mouth 4  (four) times daily as needed for diarrhea or loose stools. Patient not taking: Reported on 07/31/2018 02/13/17   Fredia Sorrow, MD  ondansetron (ZOFRAN ODT) 4 MG disintegrating tablet Take 1 tablet (4 mg total) by mouth every 8 (eight) hours as needed for nausea or vomiting. Patient not taking: Reported on 07/31/2018 02/13/17   Fredia Sorrow, MD    Family History Family History  Problem Relation Age of Onset  . Pancreatic cancer Brother   . Diabetes Brother   . Coronary artery disease Brother   . Stroke Brother   . Diabetes Brother   . Diabetes Mother   . Heart failure Mother   . Heart failure Father   . Hypothyroidism Brother   . Coronary artery disease Brother   . Other Brother        colon surgery  . Heart attack Unknown        Nephew  . Irregular heart beat Daughter   . Cancer Maternal Grandmother        unknown     Social History Social History   Tobacco Use  . Smoking status: Never Smoker  . Smokeless tobacco: Never Used  Substance Use Topics  . Alcohol use: No    Alcohol/week: 0.0 standard drinks    Comment: stopped drinking in 1998  . Drug use: No     Allergies   Morphine and Shellfish-derived products   Review of Systems Review of Systems  Constitutional: Negative for fever.  Respiratory: Positive for shortness of breath (sometimes with activity,  not new).   Gastrointestinal: Negative for abdominal pain.  Genitourinary: Negative for dysuria.  Neurological: Positive for headaches.  All other systems reviewed and are negative.    Physical Exam Updated Vital Signs BP (!) 128/59   Pulse 68   Temp 98.3 F (36.8 C) (Oral)   Resp 16   Ht 1.753 m (5\' 9" )   Wt 88.9 kg   SpO2 97%   BMI 28.94 kg/m   Physical Exam  Constitutional: He appears well-developed and well-nourished. No distress.  HENT:  Head: Normocephalic.  Right Ear: External ear normal.  Left Ear: External ear normal.  Eyes: Conjunctivae are normal. Right eye exhibits no  discharge. Left eye exhibits no discharge. No scleral icterus.  Neck: Neck supple. No tracheal deviation present.  Cardiovascular: Normal rate, regular rhythm and intact distal pulses.  Pulmonary/Chest: Effort normal and breath sounds normal. No stridor. No respiratory distress. He has no wheezes. He has no rales.  Abdominal: Soft. Bowel sounds are normal. He exhibits no distension. There is no tenderness. There is no rebound and no guarding.  Musculoskeletal: He exhibits no edema.       Cervical back: He exhibits tenderness.       Lumbar back: He exhibits tenderness.  Neurological: He is alert. He has normal strength. No cranial nerve deficit (no facial droop, extraocular movements intact, no slurred speech) or sensory deficit. He exhibits normal muscle tone. He displays no seizure activity. Coordination normal.  Skin: Skin is warm and dry. No rash noted.  Psychiatric: He has a normal mood and affect.  Nursing note and vitals reviewed.    ED Treatments / Results  Labs (all labs ordered are listed, but only abnormal results are displayed) Labs Reviewed  BASIC METABOLIC PANEL - Abnormal; Notable for the following components:      Result Value  Sodium 134 (*)    Glucose, Bld 227 (*)    BUN 33 (*)    Creatinine, Ser 1.83 (*)    GFR calc non Af Amer 36 (*)    GFR calc Af Amer 42 (*)    All other components within normal limits  CBG MONITORING, ED - Abnormal; Notable for the following components:   Glucose-Capillary 234 (*)    All other components within normal limits  CBC  PROTIME-INR    EKG EKG Interpretation  Date/Time:  Monday July 31 2018 21:08:43 EDT Ventricular Rate:  77 PR Interval:    QRS Duration: 143 QT Interval:  424 QTC Calculation: 480 R Axis:   -25 Text Interpretation:  Sinus rhythm Right bundle branch block No significant change since last tracing Confirmed by Dorie Rank [phone]) on 07/31/2018 9:13:12 PM   Radiology Dg Lumbar Spine Complete  Result Date:  07/31/2018 CLINICAL DATA:  Fall with low back pain EXAM: LUMBAR SPINE - COMPLETE 4+ VIEW COMPARISON:  None. FINDINGS: There is no evidence of lumbar spine fracture. Alignment is normal. Intervertebral disc spaces are maintained. IMPRESSION: Negative. Electronically Signed   By: Ulyses Jarred [name]   On: 07/31/2018 22:09   Ct Head Wo Contrast  Result Date: 07/31/2018 CLINICAL DATA:  72 year old male with C-spine trauma. EXAM: CT HEAD WITHOUT CONTRAST CT CERVICAL SPINE WITHOUT CONTRAST TECHNIQUE: Multidetector CT imaging of the head and cervical spine was performed following the standard protocol without intravenous contrast. Multiplanar CT image reconstructions of the cervical spine were also generated. COMPARISON:  Head CT dated 08/31/2017 and cervical spine CT dated 03/21/2018. FINDINGS: CT HEAD FINDINGS Brain: The ventricles and sulci appropriate size for patient's age. Minimal periventricular and deep white matter chronic microvascular ischemic changes. There is no acute intracranial hemorrhage. No mass effect or midline shift. No extra-axial fluid collection. Vascular: No hyperdense vessel or unexpected calcification. Skull: Normal. Negative for fracture or focal lesion. Sinuses/Orbits: No acute finding. Other: None CT CERVICAL SPINE FINDINGS Alignment: No acute subluxation. Skull base and vertebrae: No acute fracture. Soft tissues and spinal canal: No prevertebral fluid or swelling. No visible canal hematoma. Disc levels: C5-C7 ACDF. Multilevel facet hypertrophy most prominent at C4-C5, C5-C6 on the left. Upper chest: Negative. Other: None IMPRESSION: *No acute intracranial pathology. *No acute/traumatic cervical spine pathology.  C5-C7 ACDF. Electronically Signed   By: Anner Crete [name]   On: 07/31/2018 22:34   Ct Cervical Spine Wo Contrast  Result Date: 07/31/2018 CLINICAL DATA:  72 year old male with C-spine trauma. EXAM: CT HEAD WITHOUT CONTRAST CT CERVICAL SPINE WITHOUT CONTRAST TECHNIQUE:  Multidetector CT imaging of the head and cervical spine was performed following the standard protocol without intravenous contrast. Multiplanar CT image reconstructions of the cervical spine were also generated. COMPARISON:  Head CT dated 08/31/2017 and cervical spine CT dated 03/21/2018. FINDINGS: CT HEAD FINDINGS Brain: The ventricles and sulci appropriate size for patient's age. Minimal periventricular and deep white matter chronic microvascular ischemic changes. There is no acute intracranial hemorrhage. No mass effect or midline shift. No extra-axial fluid collection. Vascular: No hyperdense vessel or unexpected calcification. Skull: Normal. Negative for fracture or focal lesion. Sinuses/Orbits: No acute finding. Other: None CT CERVICAL SPINE FINDINGS Alignment: No acute subluxation. Skull base and vertebrae: No acute fracture. Soft tissues and spinal canal: No prevertebral fluid or swelling. No visible canal hematoma. Disc levels: C5-C7 ACDF. Multilevel facet hypertrophy most prominent at C4-C5, C5-C6 on the left. Upper chest: Negative. Other: None IMPRESSION: *No acute intracranial pathology. *  No acute/traumatic cervical spine pathology.  C5-C7 ACDF. Electronically Signed   By: Anner Crete [name]   On: 07/31/2018 22:34    Procedures Procedures (including critical care time)  Medications Ordered in ED Medications  sodium chloride 0.9 % bolus 500 mL (500 mLs Intravenous New Bag/Given 07/31/18 2321)     Initial Impression / Assessment and Plan / ED Course  I have reviewed the triage vital signs and the nursing notes.  Pertinent labs & imaging results that were available during my care of the patient were reviewed by me and considered in my medical decision making (see chart for details).   Patient presents to the emergency room for evaluation of a head and neck injury after a fall.  Patient states he was leaning forward when it sounds like he lost his balance and fell striking his head.   Patient did have a small abrasion on his left forehead.  He had cervical spine tenderness.  CT scan fortunately does not show any evidence of injury.  Patient's laboratory tests were notable for mild increase in his BUN and creatinine.  The patient was given IV fluids here in the emergency room.  Vitals signs are normal.  No signs of hypotension.  At this time there does not appear to be any evidence of an acute emergency medical condition and the patient appears stable for discharge with appropriate outpatient follow up.   Final Clinical Impressions(s) / ED Diagnoses   Final diagnoses:  Minor head injury, initial encounter    ED Discharge Orders    None       Dorie Rank, MD 07/31/18 2352

## 2018-08-02 ENCOUNTER — Other Ambulatory Visit: Payer: Self-pay | Admitting: Family Medicine

## 2018-08-02 MED FILL — GABAPENTIN 100 MG CAPSULE: 100 | 90 days supply | Qty: 180 | Fill #0

## 2018-08-02 MED FILL — NORTRIPTYLINE HCL 10 MG CAP: 10 | 30 days supply | Qty: 30 | Fill #2

## 2018-08-02 MED FILL — NOVOLOG FLEXPEN SYRINGE: 100 | 8 days supply | Qty: 15 | Fill #4

## 2018-08-02 MED FILL — TOUJEO SOLOSTAR 300 UNITS/M: 300 | 34 days supply | Qty: 5 | Fill #1

## 2018-08-02 MED FILL — METOPROLOL SUCCINATE ER 50: 50 | 30 days supply | Qty: 45 | Fill #3

## 2018-08-02 MED FILL — PENTIPS 31G X 5 MM MISC: 31G X 5 MM | 90 days supply | Qty: 100 | Fill #0

## 2018-08-02 MED FILL — NovoLIN N 100 UNIT/ML SUSP: 100 | 17 days supply | Qty: 10 | Fill #9

## 2018-08-02 MED FILL — PRAMIPEXOLE 1 MG TABLET: 1 | 30 days supply | Qty: 30 | Fill #1

## 2018-08-02 MED FILL — DIVALPROEX SOD ER 250 MG TA: 250 | 30 days supply | Qty: 90 | Fill #3

## 2018-08-02 MED FILL — WARFARIN SODIUM 5 MG TABLET: 5 | 30 days supply | Qty: 45 | Fill #1

## 2018-08-02 MED FILL — MIDODRINE HCL 10 MG TABLET: 10 | 30 days supply | Qty: 90 | Fill #1

## 2018-08-03 DIAGNOSIS — M545 Low back pain: Secondary | ICD-10-CM | POA: Diagnosis not present

## 2018-08-08 ENCOUNTER — Ambulatory Visit (INDEPENDENT_AMBULATORY_CARE_PROVIDER_SITE_OTHER): Payer: Medicare PPO

## 2018-08-08 DIAGNOSIS — Z23 Encounter for immunization: Secondary | ICD-10-CM | POA: Diagnosis not present

## 2018-08-09 DIAGNOSIS — M545 Low back pain: Secondary | ICD-10-CM | POA: Diagnosis not present

## 2018-08-10 ENCOUNTER — Ambulatory Visit: Payer: Medicare PPO | Admitting: Cardiology

## 2018-08-16 ENCOUNTER — Ambulatory Visit (INDEPENDENT_AMBULATORY_CARE_PROVIDER_SITE_OTHER): Payer: Medicare PPO | Admitting: *Deleted

## 2018-08-16 DIAGNOSIS — Z5181 Encounter for therapeutic drug level monitoring: Secondary | ICD-10-CM | POA: Diagnosis not present

## 2018-08-16 DIAGNOSIS — I4891 Unspecified atrial fibrillation: Secondary | ICD-10-CM | POA: Diagnosis not present

## 2018-08-16 DIAGNOSIS — I480 Paroxysmal atrial fibrillation: Secondary | ICD-10-CM

## 2018-08-16 DIAGNOSIS — Z7901 Long term (current) use of anticoagulants: Secondary | ICD-10-CM

## 2018-08-16 LAB — POCT INR: INR: 2.2 (ref 2.0–3.0)

## 2018-08-16 NOTE — Patient Instructions (Signed)
Description   Continue taking 1 tablet daily.  Recheck in 4 weeks. Call us with any medication changes [phone] [facility]. Main # [phone]. Increase intake of greens to 2 servings a week

## 2018-08-21 ENCOUNTER — Ambulatory Visit (INDEPENDENT_AMBULATORY_CARE_PROVIDER_SITE_OTHER): Payer: Medicare PPO | Admitting: Family Medicine

## 2018-08-21 ENCOUNTER — Encounter: Payer: Self-pay | Admitting: Family Medicine

## 2018-08-21 DIAGNOSIS — E559 Vitamin D deficiency, unspecified: Secondary | ICD-10-CM

## 2018-08-21 DIAGNOSIS — R609 Edema, unspecified: Secondary | ICD-10-CM

## 2018-08-21 DIAGNOSIS — M19049 Primary osteoarthritis, unspecified hand: Secondary | ICD-10-CM | POA: Diagnosis not present

## 2018-08-21 DIAGNOSIS — I4891 Unspecified atrial fibrillation: Secondary | ICD-10-CM

## 2018-08-21 DIAGNOSIS — I10 Essential (primary) hypertension: Secondary | ICD-10-CM

## 2018-08-21 DIAGNOSIS — E1165 Type 2 diabetes mellitus with hyperglycemia: Secondary | ICD-10-CM | POA: Diagnosis not present

## 2018-08-21 NOTE — Assessment & Plan Note (Signed)
Restarted Gabapentin but rash, stuttering and Dry mouth and even some confusion so he will stop and reassess in a couple of weeks

## 2018-08-21 NOTE — Patient Instructions (Signed)
No more Gabapentin  Carbohydrate Counting for Diabetes Mellitus, Adult Carbohydrate counting is a method for keeping track of how many carbohydrates you eat. Eating carbohydrates naturally increases the amount of sugar (glucose) in the blood. Counting how many carbohydrates you eat helps keep your blood glucose within normal limits, which helps you manage your diabetes (diabetes mellitus). It is important to know how many carbohydrates you can safely have in each meal. This is different for every person. A diet and nutrition specialist (registered dietitian) can help you make a meal plan and calculate how many carbohydrates you should have at each meal and snack. Carbohydrates are found in the following foods:  Grains, such as breads and cereals.  Dried beans and soy products.  Starchy vegetables, such as potatoes, peas, and corn.  Fruit and fruit juices.  Milk and yogurt.  Sweets and snack foods, such as cake, cookies, candy, chips, and soft drinks.  How do I count carbohydrates? There are two ways to count carbohydrates in food. You can use either of the methods or a combination of both. Reading "Nutrition Facts" on packaged food The "Nutrition Facts" list is included on the labels of almost all packaged foods and beverages in the [place] It includes:  The serving size.  Information about nutrients in each serving, including the grams (g) of carbohydrate per serving.  To use the "Nutrition Facts":  Decide how many servings you will have.  Multiply the number of servings by the number of carbohydrates per serving.  The resulting number is the total amount of carbohydrates that you will be having.  Learning standard serving sizes of other foods When you eat foods containing carbohydrates that are not packaged or do not include "Nutrition Facts" on the label, you need to measure the servings in order to count the amount of carbohydrates:  Measure the foods that you will eat with a  food scale or measuring cup, if needed.  Decide how many standard-size servings you will eat.  Multiply the number of servings by 15. Most carbohydrate-rich foods have about 15 g of carbohydrates per serving. ? For example, if you eat 8 oz (170 g) of strawberries, you will have eaten 2 servings and 30 g of carbohydrates (2 servings x 15 g = 30 g).  For foods that have more than one food mixed, such as soups and casseroles, you must count the carbohydrates in each food that is included.  The following list contains standard serving sizes of common carbohydrate-rich foods. Each of these servings has about 15 g of carbohydrates:   hamburger bun or  English muffin.   oz (15 mL) syrup.   oz (14 g) jelly.  1 slice of bread.  1 six-inch tortilla.  3 oz (85 g) cooked rice or pasta.  4 oz (113 g) cooked dried beans.  4 oz (113 g) starchy vegetable, such as peas, corn, or potatoes.  4 oz (113 g) hot cereal.  4 oz (113 g) mashed potatoes or  of a large baked potato.  4 oz (113 g) canned or frozen fruit.  4 oz (120 mL) fruit juice.  4-6 crackers.  6 chicken nuggets.  6 oz (170 g) unsweetened dry cereal.  6 oz (170 g) plain fat-free yogurt or yogurt sweetened with artificial sweeteners.  8 oz (240 mL) milk.  8 oz (170 g) fresh fruit or one small piece of fruit.  24 oz (680 g) popped popcorn.  Example of carbohydrate counting Sample meal  3 oz (  85 g) chicken breast.  6 oz (170 g) brown rice.  4 oz (113 g) corn.  8 oz (240 mL) milk.  8 oz (170 g) strawberries with sugar-free whipped topping. Carbohydrate calculation 1. Identify the foods that contain carbohydrates: ? Rice. ? Corn. ? Milk. ? Strawberries. 2. Calculate how many servings you have of each food: ? 2 servings rice. ? 1 serving corn. ? 1 serving milk. ? 1 serving strawberries. 3. Multiply each number of servings by 15 g: ? 2 servings rice x 15 g = 30 g. ? 1 serving corn x 15 g = 15 g. ? 1  serving milk x 15 g = 15 g. ? 1 serving strawberries x 15 g = 15 g. 4. Add together all of the amounts to find the total grams of carbohydrates eaten: ? 30 g + 15 g + 15 g + 15 g = 75 g of carbohydrates total. This information is not intended to replace advice given to you by your health care provider. Make sure you discuss any questions you have with your health care provider. Document Released: 11/22/2005 Document Revised: 06/11/2016 Document Reviewed: 05/05/2016 Elsevier Interactive Patient Education  [employer].

## 2018-08-21 NOTE — Progress Notes (Signed)
Subjective:  I acted as a Education administrator for Dr. Charlett Blake. [place], [place]  Patient ID: [name] [name], male    DOB: 06/28/1946, 72 y.o.   MRN: [id number]  No chief complaint on file.   HPI  Patient is in today for 5 week follow up and he notes he is doing better today. He has been struggling with increased pedal edema over the past few weeks and also noted some dry mouth and confusion on Gabapentin. Since stopping it he notes these things are improving. He had also had a rash on both legs but that has also improved. He has some mild edema but it is mild. He notes his sugars have been improving and overall he feels well. Denies CP/palp/SOB/HA/congestion/fevers/GI or GU c/o. Taking meds as prescribed  Patient Care Team: [name], MD as PCP - General (Family Medicine) [name], MD as PCP - Cardiology (Cardiology) Pieter Partridge, DO as Consulting Physician (Neurology) [name], MD as Consulting Physician (Cardiology) Deboraha Sprang, MD as Consulting Physician (Cardiology) [place], Mike Gip, MD as Consulting Physician (Pulmonary Disease) Warden Fillers, MD as Consulting Physician (Ophthalmology)   Past Medical History:  Diagnosis Date  . ACP (advance care planning) 05/11/2015  . Acquired atrophy of thyroid 06/05/2013   Overview:  July 2014: controlled on synthroid 26mcg since 2012 May 2015: decreased to Synthroid 50 mcg  Last Assessment & Plan:  Pt doing well with most recent TSH within normal range.  Will continue current dosage of Synthroid 44mcg. Pt reminded to take medication on empty stomach. Will continue to monitor with periodic laboratory assessment in 3 months.   . Advance care planning 05/01/2015  . Anemia    hemoglobin 7.4, iron deficiency, January, 2011, 2 unit transfusion, endoscopy normal, capsule endoscopy February, 2011 no small bowel abnormalities.   Most likely source gastric erosions, followed by GI  . Anxiety   . Arteriosclerosis of coronary  artery 05/01/2013   Overview:  July 7106: Bystolic, Simvastatin, Imdur, ASA, and NTG CABG x 4 in 2000, stent placed 02/06/1998, 11/26/2008 Sep 2694: Changed Bystolic 10 to atenolol 25mg  (for cost), Continue Simvastatin, Imdur, ASA, and NTG  Last Assessment & Plan:  Change bystolic to atenolol (for cost) and continue others as pt doing well.   . Atrial fibrillation ([place]) [I48.91] 07/20/2016  . CAD (coronary artery disease)    A. CABG in 2000,status post cardiac cath in 2006, 2009 ....continued chest pain and SOB despite oral medication adjestments including Ranexa. B. Cath November 2009/ mRCA - 2.75 x 23 Abbott Xience V drug-eluting stent ...11/26/2008 to distal  RCA leading to acute marginal.  C. Cath 07/2012 for CP - stable anatomy, med rx. d. cath 2015 and 05/30/2015 stable anatomy, consider Myoview if has CP again  . Carotid artery disease ([place]) 08/01/2015   Doppler, May 29, 2015, 1-39% bilateral ICA   . Cerebral ischemia    MRI November, 2010, chronic microvascular ischemia  . Chronic diastolic CHF (congestive heart failure) ([place]) 03/13/2016  . Concussion   . Coronary artery disease involving native coronary artery    status post coronary artery bypass graft in 2000,status post cardiac cath in 2006,  2009 ....continued chest pain and SOB despite oral medication adjestments including Ranexa.   Catheter November 2009/ DES  Stent - RCA...--> 11/26/2008 to distal  RCA leading to acute marginal.   Cath 01/26/2010 continued patentcy grafts and stent to RCA , Modest progression of distal disease...medical Rx/  /  [facility]  . Depression    Bipolar  . Diabetes mellitus type II, uncontrolled ([place])   . Diabetes mellitus without complication ([place])   . Diarrhea 02/15/2016  . Dizziness   . DM (diabetes mellitus), type 2 ([place]) 05/01/2013   Overview:  July 2014: metformin, Lantus & Novolog. Aug 2014: 40u Lantus nightly, 20-25u of Novolog prior to meals with metformin. Added Oseni 25mg /15mg  (worked but cost prohibitive)  Jun 2015: 40u Lantus nightly, 10-15u of Novolog prior to meals with metformin. September 2015: Samples of Oseni 15/25 given for 28 days, referral to endocrinology Nov 2015: Samples of Toujeo give (4.46ml)  Last Assessme  . Edema   . Ejection fraction    EF 60%, echo, July 31, 2012  . Essential hypertension 06/05/2013   Overview:  July 2014: Controlled with Bystolic Sep 5329: Changed to atenolol.  Last Assessment & Plan:  Will change to atenolol for cost and follow Improved.  Medication compliance strongly encouraged BP: 116/64 mmHg    Overview:  July 2014: Controlled with Bystolic Sep 9242: Changed to atenolol.  Last Assessment & Plan:  Will change to atenolol for cost and follow  . Exertional dyspnea 05/14/2016  . Falling episodes    these have occurred in the past and again recurring 2011  . Family history of adverse reaction to anesthesia    "mother died during bypass surgery but not sure if it has to do with anesthesia"  . Gastric ulcer   . GERD (gastroesophageal reflux disease)   . H/O medication noncompliance    Due to loss of insurance  . Hard of hearing   . Headache 01/25/2016  . History of blood transfusion 12/20/2013  . Hx of CABG    2000,  / one median sternotomy suture broken her chest x-ray November, 2010, no clinical significance  . Hyperlipidemia   . Hypertension    pt. denies  . Iron deficiency anemia   . Long term (current) use of anticoagulants [Z79.01] 07/20/2016  . Low back pain 06/12/2009   Qualifier: Diagnosis of  By: [name]   . Nausea with vomiting 01/25/2016  . Nephrolithiasis   . OSA (obstructive sleep apnea)   . Pain in limb 06/12/2009   Qualifier: Diagnosis of  By: [name]   . Palpitations    event recorder showed sinus rhythm  . RBBB 07/09/2009   Qualifier: Diagnosis of  By: [name], MD, [name] [name]   . RBBB (right bundle branch block)   . Restless leg   . Right knee pain 01/07/2015  . RLS (restless legs syndrome) 09/19/2009   Qualifier:  Diagnosis of  By: [name]   Overview:  July 2014: Controlled with Mirapex  Last Assessment & Plan:  Patient is doing well. Will continue current management and follow clinically.  . Seizure disorder ([place]) 01/09/2017  . Shortness of breath    CPX April, 2011, mild functional limitation, no clear pulmonary or cardiac limitation, possible deconditioning and mild chronotropic incompetence( peak heart rate 130)  . Spondylosis    C5-6, C6-7 MRI 2010  . Syncope 03/2016  . Thyroid disease   . Tubulovillous adenoma of colon 2007  . Vitamin D deficiency 05/17/2017  . Wears glasses     Past Surgical History:  Procedure Laterality Date  . ANTERIOR CERVICAL DECOMP/DISCECTOMY FUSION N/A 05/31/2016   Procedure: ANTERIOR CERVICAL DECOMPRESSION/DISCECTOMY FUSION CERVICAL FIVE-SIX,CERVICAL SIX-SEVEN;  Surgeon: [name], MD;  Location: [facility];  Service: Neurosurgery;  Laterality: N/A;  . CARDIAC CATHETERIZATION N/A 05/30/2015   Procedure: Left Heart Cath and Coronary Angiography;  Surgeon: [name], MD;  Location: [facility];  Service: Cardiovascular;  Laterality: N/A;  . COLONOSCOPY    . CORONARY ARTERY BYPASS GRAFT     2000  . ESOPHAGOGASTRODUODENOSCOPY    . LEFT HEART CATH AND CORS/GRAFTS ANGIOGRAPHY N/A 12/15/2017   Procedure: LEFT HEART CATH AND CORS/GRAFTS ANGIOGRAPHY;  Surgeon: [name], MD;  Location: [place];  Service: Cardiovascular;  Laterality: N/A;  . LEFT HEART CATHETERIZATION WITH CORONARY/GRAFT ANGIOGRAM N/A 08/01/2012   Procedure: LEFT HEART CATHETERIZATION WITH Beatrix Fetters;  Surgeon: [name], MD;  Location: [facility] CATH LAB;  Service: Cardiovascular;  Laterality: N/A;  . LEFT HEART CATHETERIZATION WITH CORONARY/GRAFT ANGIOGRAM N/A 01/03/2015   Procedure: LEFT HEART CATHETERIZATION WITH Beatrix Fetters;  Surgeon: [name], MD;  Location: [facility] CATH LAB;  Service: Cardiovascular;  Laterality: N/A;  . NASAL SEPTUM  SURGERY     UP3  . PERCUTANEOUS CORONARY STENT INTERVENTION (PCI-S)  10/2008   mRCA PCI  2.75 x 23 Abbott Xience V drug-eluting stent   . ULTRASOUND GUIDANCE FOR VASCULAR ACCESS  12/15/2017   Procedure: Ultrasound Guidance For Vascular Access;  Surgeon: [name], MD;  Location: [place] [facility];  Service: Cardiovascular;;    Family History  Problem Relation Age of Onset  . Pancreatic cancer Brother   . Diabetes Brother   . Coronary artery disease Brother   . Stroke Brother   . Diabetes Brother   . Diabetes Mother   . Heart failure Mother   . Heart failure Father   . Hypothyroidism Brother   . Coronary artery disease Brother   . Other Brother        colon surgery  . Heart attack Unknown        Nephew  . Irregular heart beat Daughter   . Cancer Maternal Grandmother        unknown     Social History   Socioeconomic History  . Marital status: Married    Spouse name: Not on file  . Number of children: 4  . Years of education: 36  . Highest education level: Not on file  Occupational History  . Occupation: retired  Scientific laboratory technician  . Financial resource strain: Not on file  . Food insecurity:    Worry: Not on file    Inability: Not on file  . Transportation needs:    Medical: Not on file    Non-medical: Not on file  Tobacco Use  . Smoking status: Never Smoker  . Smokeless tobacco: Never Used  Substance and Sexual Activity  . Alcohol use: No    Alcohol/week: 0.0 standard drinks    Comment: stopped drinking in 1998  . Drug use: No  . Sexual activity: Not on file  Lifestyle  . Physical activity:    Days per week: Not on file    Minutes per session: Not on file  . Stress: Not on file  Relationships  . Social connections:    Talks on phone: Not on file    Gets together: Not on file    Attends religious service: Not on file    Active member of club or organization: Not on file    Attends meetings of clubs or organizations: Not on file    Relationship  status: Not on file  . Intimate partner violence:    Fear of current  or ex partner: Not on file    Emotionally abused: Not on file    Physically abused: Not on file    Forced sexual activity: Not on file  Other Topics Concern  . Not on file  Social History Narrative   Patient is right handed.   Patient does not drink caffeine.    Outpatient Medications Prior to Visit  Medication Sig Dispense Refill  . amiodarone (PACERONE) 400 MG tablet Take 200 mg by mouth daily.    Marland Kitchen atorvastatin (LIPITOR) 40 MG tablet TAKE 1 & 1/2 TABLETS (60 MG TOTAL) BY MOUTH DAILY. (Patient taking differently: Take 60 mg by mouth daily at 6 PM. ) 45 tablet 3  . diclofenac sodium (VOLTAREN) 1 % GEL Apply 2 g topically daily as needed (for pain in affected area). 2 Tube 1  . divalproex (DEPAKOTE ER) 250 MG 24 hr tablet Take 3 tablets (750 mg total) by mouth at bedtime. 90 tablet 5  . furosemide (LASIX) 20 MG tablet Take 1 tablet (20 mg total) by mouth daily as needed for edema. 30 tablet 3  . glucose blood (ACCU-CHEK AVIVA) test strip Use as directed to check blood sugar 4 times daily.  DX E11.9 200 each 6  . insulin aspart (NOVOLOG FLEXPEN) 100 UNIT/ML FlexPen 3 times a day (just before each meal) 65-45-65, and pen needles 3/day (Patient taking differently: Inject 45-65 Units into the skin See admin instructions. Use 65 units every morning, use 45 units at lunch and use 65 units at dinner) 20 pen 11  . insulin NPH Human (HUMULIN N,NOVOLIN N) 100 UNIT/ML injection Inject 0.72 mLs (72 Units total) into the skin 2 (two) times daily. And syringes 1/day 130 mL 0  . isosorbide mononitrate (IMDUR) 60 MG 24 hr tablet TAKE TWO TABLETS BY MOUTH DAILY (Patient taking differently: Take 120 mg by mouth daily. ) 180 tablet 0  . levothyroxine (SYNTHROID, LEVOTHROID) 88 MCG tablet TAKE 1 TABLET (88 MCG TOTAL) BY MOUTH DAILY BEFORE BREAKFAST. 30 tablet 6  . loperamide (IMODIUM A-D) 2 MG tablet Take 1 tablet (2 mg total) by mouth 4  (four) times daily as needed for diarrhea or loose stools. 30 tablet 0  . metoprolol succinate (TOPROL-XL) 50 MG 24 hr tablet TAKE 1&1/2 TABLETS BY MOUTH ONCE A DAY FOR A TOTAL OF 75 MG. (Patient taking differently: Take 75 mg by mouth daily. ) 45 tablet 6  . midodrine (PROAMATINE) 10 MG tablet TAKE 1 TABLET (10 MG TOTAL) BY MOUTH 3 TIMES DAILY. (Patient taking differently: Take 10 mg by mouth 3 (three) times daily. ) 90 tablet 9  . nitroGLYCERIN (NITROSTAT) 0.4 MG SL tablet PLACE 1 TABLET (0.4 MG TOTAL) UNDER THE TONGUE EVERY 5 (FIVE) MINUTES AS NEEDED FOR CHEST PAIN. 25 tablet 1  . ondansetron (ZOFRAN ODT) 4 MG disintegrating tablet Take 1 tablet (4 mg total) by mouth every 8 (eight) hours as needed for nausea or vomiting. 10 tablet 1  . pantoprazole (PROTONIX) 40 MG tablet Take 1 tablet (40 mg total) by mouth daily. 90 tablet 0  . pramipexole (MIRAPEX) 1 MG tablet TAKE 1/2 TO 1 TABLET BY MOUTH AT BEDTIME (Patient taking differently: Take 1 mg by mouth at bedtime. ) 30 tablet 2  . ranolazine (RANEXA) 500 MG 12 hr tablet Take 1 tablet (500 mg total) by mouth 2 (two) times daily. 14 tablet 0  . sertraline (ZOLOFT) 100 MG tablet TAKE 1 TABLET (100 MG TOTAL) BY MOUTH 2 (TWO) TIMES DAILY. [place]  tablet 1  . Vitamin D, Ergocalciferol, (DRISDOL) 50000 units CAPS capsule TAKE 1 CAPSULE (50,000 UNITS TOTAL) BY MOUTH EVERY 7 (SEVEN) DAYS. 12 capsule 2  . warfarin (COUMADIN) 5 MG tablet TAKE AS DIRECTED BY COUMADIN CLINIC (Patient taking differently: Take 5 mg by mouth at bedtime. ) 45 tablet 4  . gabapentin (NEURONTIN) 100 MG capsule TAKE 2 CAPSULES (200 MG TOTAL) BY MOUTH AT BEDTIME. 180 capsule 1   No facility-administered medications prior to visit.     Allergies  Allergen Reactions  . Gabapentin Rash  . Morphine Other (See Comments)    hallucinations  . Shellfish-Derived Products Itching    Review of Systems  Constitutional: Negative for fever and malaise/fatigue.  HENT: Negative for congestion.     Eyes: Negative for blurred vision.  Respiratory: Negative for shortness of breath.   Cardiovascular: Positive for leg swelling. Negative for chest pain and palpitations.  Gastrointestinal: Negative for abdominal pain, blood in stool and nausea.  Genitourinary: Negative for dysuria and frequency.  Musculoskeletal: Negative for falls.  Skin: Positive for rash.  Neurological: Negative for dizziness, loss of consciousness and headaches.  Endo/Heme/Allergies: Negative for environmental allergies.  Psychiatric/Behavioral: Negative for depression. The patient is not nervous/anxious.        Objective:    Physical Exam  Constitutional: He is oriented to person, place, and time. He appears well-developed and well-nourished. No distress.  HENT:  Head: Normocephalic and atraumatic.  Nose: Nose normal.  Eyes: Right eye exhibits no discharge. Left eye exhibits no discharge.  Neck: Normal range of motion. Neck supple.  Cardiovascular: Normal rate.  Pulmonary/Chest: Effort normal and breath sounds normal.  Abdominal: Soft. Bowel sounds are normal. There is no tenderness.  Musculoskeletal: He exhibits edema.  Neurological: He is alert and oriented to person, place, and time.  Skin: Skin is warm and dry. There is erythema.  Scattered papules on b/l lower extremities.   Psychiatric: He has a normal mood and affect.  Nursing note and vitals reviewed.   BP 136/70 (BP Location: Left Arm, Patient Position: Sitting, Cuff Size: Normal)   Pulse 64   Temp 97.9 F (36.6 C) (Oral)   Resp 18   Wt 201 lb 12.8 oz (91.5 kg)   SpO2 98%   BMI 29.80 kg/m  Wt Readings from Last 3 Encounters:  08/21/18 201 lb 12.8 oz (91.5 kg)  07/31/18 196 lb (88.9 kg)  07/20/18 208 lb 1.9 oz (94.4 kg)   BP Readings from Last 3 Encounters:  08/21/18 136/70  07/31/18 (!) 128/59  07/20/18 136/78     Immunization History  Administered Date(s) Administered  . Influenza Split 09/21/2012  . Influenza Whole  10/16/2007, 10/01/2008, 09/23/2009, 09/22/2010  . Influenza, High Dose Seasonal PF 10/08/2013, 08/28/2014, 09/08/2017, 08/08/2018  . Influenza,inj,Quad PF,6+ Mos 08/22/2015, 08/22/2015, 08/24/2016  . Influenza-Unspecified 09/05/2012, 09/04/2014  . Pneumococcal Conjugate-13 01/23/2014, 08/24/2016  . Pneumococcal Polysaccharide-23 12/18/2008, 01/03/2015  . Td 12/18/2007  . Zoster 11/25/2010, 12/07/2011, 11/30/2012    Health Maintenance  Topic Date Due  . Hepatitis C Screening  05-Jan-1946  . OPHTHALMOLOGY EXAM  06/20/2012  . URINE MICROALBUMIN  04/30/2016  . TETANUS/TDAP  12/17/2017  . HEMOGLOBIN A1C  01/17/2019  . COLONOSCOPY  02/11/2019  . FOOT EXAM  03/10/2019  . INFLUENZA VACCINE  Completed  . PNA vac Low Risk Adult  Completed    Lab Results  Component Value Date   WBC 7.7 07/31/2018   HGB 14.8 07/31/2018   HCT 41.2 07/31/2018  PLT 161 07/31/2018   GLUCOSE 227 (H) 07/31/2018   CHOL 95 07/17/2018   TRIG 186.0 (H) 07/17/2018   HDL 25.70 (L) 07/17/2018   LDLDIRECT 63.0 10/18/2017   LDLCALC 32 07/17/2018   ALT 20 07/17/2018   AST 20 07/17/2018   NA 134 (L) 07/31/2018   K 4.1 07/31/2018   CL 99 07/31/2018   CREATININE 1.83 (H) 07/31/2018   BUN 33 (H) 07/31/2018   CO2 24 07/31/2018   TSH 3.66 07/17/2018   PSA 2.39 11/25/2010   INR 2.2 08/16/2018   HGBA1C 9.9 (H) 07/17/2018   MICROALBUR 1.1 05/01/2015    Lab Results  Component Value Date   TSH 3.66 07/17/2018   Lab Results  Component Value Date   WBC 7.7 07/31/2018   HGB 14.8 07/31/2018   HCT 41.2 07/31/2018   MCV 84.9 07/31/2018   PLT 161 07/31/2018   Lab Results  Component Value Date   NA 134 (L) 07/31/2018   K 4.1 07/31/2018   CO2 24 07/31/2018   GLUCOSE 227 (H) 07/31/2018   BUN 33 (H) 07/31/2018   CREATININE 1.83 (H) 07/31/2018   BILITOT 0.5 07/17/2018   ALKPHOS 82 07/17/2018   AST 20 07/17/2018   ALT 20 07/17/2018   PROT 7.2 07/17/2018   ALBUMIN 4.4 07/17/2018   CALCIUM 8.9 07/31/2018    ANIONGAP 11 07/31/2018   GFR 48.09 (L) 07/17/2018   Lab Results  Component Value Date   CHOL 95 07/17/2018   Lab Results  Component Value Date   HDL 25.70 (L) 07/17/2018   Lab Results  Component Value Date   LDLCALC 32 07/17/2018   Lab Results  Component Value Date   TRIG 186.0 (H) 07/17/2018   Lab Results  Component Value Date   CHOLHDL 4 07/17/2018   Lab Results  Component Value Date   HGBA1C 9.9 (H) 07/17/2018         Assessment & Plan:   Problem List Items Addressed This Visit    Diabetes mellitus type II, uncontrolled ([place]) (Chronic)    He is getting better about taking his insulin more consistent and as long as he takes it his sugars are staying under 160. Encouraged to stop diet soda. Check A1C in 8 weeks      Edema    Minimize sodium, elevate the feet and use compression hose. Furosemide prn for edema. He notes it was significantly worse last week and he had a notable rash as well but it is improving this week.       Essential hypertension    Well controlled, no changes to meds. Encouraged heart healthy diet such as the DASH diet and exercise as tolerated.       Atrial fibrillation ([facility]) [I48.91]    Rate controlled and tolerating coumadin       Vitamin D deficiency    Supplement and monitor      Osteoarthritis of hand    Restarted Gabapentin but rash, stuttering and Dry mouth and even some confusion so he will stop and reassess in a couple of weeks         I have discontinued Allister A. Marsan's gabapentin. I am also having him maintain his ondansetron, loperamide, amiodarone, glucose blood, levothyroxine, Vitamin D (Ergocalciferol), ranolazine, sertraline, insulin aspart, metoprolol succinate, divalproex, atorvastatin, diclofenac sodium, nitroGLYCERIN, isosorbide mononitrate, pramipexole, warfarin, pantoprazole, midodrine, furosemide, and insulin NPH Human.  No orders of the defined types were placed in this encounter.   CMA served as  Education administrator  during this visit. History, Physical and Plan performed by medical provider. Documentation and orders reviewed and attested to.  Penni Homans, MD

## 2018-08-21 NOTE — Assessment & Plan Note (Signed)
He is getting better about taking his insulin more consistent and as long as he takes it his sugars are staying under 160. Encouraged to stop diet soda. Check A1C in 8 weeks

## 2018-08-21 NOTE — Assessment & Plan Note (Addendum)
Minimize sodium, elevate the feet and use compression hose. Furosemide prn for edema. He notes it was significantly worse last week and he had a notable rash as well but it is improving this week.

## 2018-08-23 NOTE — Assessment & Plan Note (Signed)
Supplement and monitor 

## 2018-08-23 NOTE — Assessment & Plan Note (Signed)
Well controlled, no changes to meds. Encouraged heart healthy diet such as the DASH diet and exercise as tolerated.  °

## 2018-08-23 NOTE — Assessment & Plan Note (Signed)
Rate controlled and tolerating coumadin 

## 2018-08-31 ENCOUNTER — Other Ambulatory Visit: Payer: Self-pay | Admitting: Family Medicine

## 2018-08-31 ENCOUNTER — Other Ambulatory Visit: Payer: Self-pay | Admitting: Neurology

## 2018-08-31 MED FILL — METOPROLOL SUCCINATE ER 50: 50 | 30 days supply | Qty: 45 | Fill #4

## 2018-08-31 MED FILL — PRAMIPEXOLE 1 MG TABLET: 1 | 30 days supply | Qty: 30 | Fill #2

## 2018-08-31 MED FILL — LEVOTHYROXINE 88 MCG TABLET: 88 | 30 days supply | Qty: 30 | Fill #6

## 2018-08-31 MED FILL — NOVOLOG FLEXPEN SYRINGE: 100 | 8 days supply | Qty: 15 | Fill #5

## 2018-08-31 MED FILL — SERTRALINE HCL 100 MG TAB: 100 | 90 days supply | Qty: 180 | Fill #1

## 2018-08-31 MED FILL — DIVALPROEX SOD ER 250 MG TA: 250 | 30 days supply | Qty: 90 | Fill #4

## 2018-08-31 MED FILL — TOUJEO SOLOSTAR 300 UNITS/M: 300 | 34 days supply | Qty: 5 | Fill #2

## 2018-08-31 MED FILL — FUROSEMIDE 20 MG TAB: 20 | 30 days supply | Qty: 30 | Fill #1

## 2018-08-31 NOTE — Telephone Encounter (Signed)
Pt requesting refill on Vitamin D 50,000. Okay to refill?

## 2018-09-01 MED FILL — VIT D2 1.25 MG (50,000 UNIT: 1.25 MG | 84 days supply | Qty: 12 | Fill #0

## 2018-09-11 DIAGNOSIS — Z955 Presence of coronary angioplasty implant and graft: Secondary | ICD-10-CM | POA: Diagnosis not present

## 2018-09-20 ENCOUNTER — Ambulatory Visit (INDEPENDENT_AMBULATORY_CARE_PROVIDER_SITE_OTHER): Payer: Medicare PPO | Admitting: *Deleted

## 2018-09-20 DIAGNOSIS — I4891 Unspecified atrial fibrillation: Secondary | ICD-10-CM | POA: Diagnosis not present

## 2018-09-20 DIAGNOSIS — I480 Paroxysmal atrial fibrillation: Secondary | ICD-10-CM

## 2018-09-20 DIAGNOSIS — Z5181 Encounter for therapeutic drug level monitoring: Secondary | ICD-10-CM

## 2018-09-20 DIAGNOSIS — Z7901 Long term (current) use of anticoagulants: Secondary | ICD-10-CM

## 2018-09-20 LAB — POCT INR: INR: 1.6 — AB (ref 2.0–3.0)

## 2018-09-20 NOTE — Patient Instructions (Signed)
Description   Today take 1.5 tablets then continue taking 1 tablet daily.  Recheck in 3 weeks. Call us with any medication changes [phone] [facility]. Main # [phone]. Increase intake of greens to 2 servings a week

## 2018-09-21 MED FILL — WARFARIN SODIUM 5 MG TABLET: 5 | 30 days supply | Qty: 45 | Fill #2

## 2018-09-21 MED FILL — NOVOLOG FLEXPEN SYRINGE: 100 | 8 days supply | Qty: 15 | Fill #6

## 2018-09-21 MED FILL — NovoLIN N 100 UNIT/ML SUSP: 100 | 17 days supply | Qty: 10 | Fill #10
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
## 2018-09-22 ENCOUNTER — Ambulatory Visit: Payer: Medicare PPO | Admitting: Neurology

## 2018-09-22 ENCOUNTER — Encounter: Payer: Self-pay | Admitting: Neurology

## 2018-09-22 DIAGNOSIS — Z029 Encounter for administrative examinations, unspecified: Secondary | ICD-10-CM

## 2018-09-27 ENCOUNTER — Other Ambulatory Visit: Payer: Self-pay | Admitting: Family Medicine

## 2018-09-27 MED FILL — MIDODRINE HCL 10 MG TABLET: 10 | 30 days supply | Qty: 90 | Fill #2

## 2018-09-27 MED FILL — FUROSEMIDE 20 MG TAB: 20 | 30 days supply | Qty: 30 | Fill #2

## 2018-09-28 ENCOUNTER — Encounter: Payer: Self-pay | Admitting: Neurology

## 2018-09-29 ENCOUNTER — Telehealth: Payer: Self-pay | Admitting: Neurology

## 2018-09-29 NOTE — Telephone Encounter (Signed)
Patient dismissed from [facility] by Adam Jaffe DO, effective 09/28/18.  Dismissal letter sent out by 1st class mail. LM  °

## 2018-10-09 MED FILL — NITROGLYCERIN 0.4 MG TAB SL: 0.4 | 7 days supply | Qty: 25 | Fill #1

## 2018-10-12 ENCOUNTER — Ambulatory Visit (INDEPENDENT_AMBULATORY_CARE_PROVIDER_SITE_OTHER): Payer: Medicare PPO

## 2018-10-12 DIAGNOSIS — I480 Paroxysmal atrial fibrillation: Secondary | ICD-10-CM | POA: Diagnosis not present

## 2018-10-12 DIAGNOSIS — I4891 Unspecified atrial fibrillation: Secondary | ICD-10-CM

## 2018-10-12 DIAGNOSIS — Z7901 Long term (current) use of anticoagulants: Secondary | ICD-10-CM

## 2018-10-12 DIAGNOSIS — Z5181 Encounter for therapeutic drug level monitoring: Secondary | ICD-10-CM

## 2018-10-12 LAB — POCT INR: INR: 1.9 — AB (ref 2.0–3.0)

## 2018-10-12 NOTE — Patient Instructions (Signed)
Description   Start taking 1 tablet daily except 1.5 tablets on Thursdays.  Recheck in 3 weeks. Call us with any medication changes [phone] [facility]. Main # [phone]. Increase intake of greens to 2 servings a week

## 2018-10-19 ENCOUNTER — Other Ambulatory Visit: Payer: Self-pay | Admitting: Family Medicine

## 2018-10-23 ENCOUNTER — Ambulatory Visit: Payer: Medicare PPO | Admitting: Family Medicine

## 2018-10-26 ENCOUNTER — Ambulatory Visit (INDEPENDENT_AMBULATORY_CARE_PROVIDER_SITE_OTHER): Payer: Medicare PPO | Admitting: Family Medicine

## 2018-10-26 ENCOUNTER — Encounter: Payer: Self-pay | Admitting: Family Medicine

## 2018-10-26 VITALS — BP 124/66 | HR 69 | Temp 97.9°F | Resp 18 | Wt 193.2 lb

## 2018-10-26 DIAGNOSIS — D649 Anemia, unspecified: Secondary | ICD-10-CM

## 2018-10-26 DIAGNOSIS — I10 Essential (primary) hypertension: Secondary | ICD-10-CM

## 2018-10-26 DIAGNOSIS — I5032 Chronic diastolic (congestive) heart failure: Secondary | ICD-10-CM

## 2018-10-26 DIAGNOSIS — G4733 Obstructive sleep apnea (adult) (pediatric): Secondary | ICD-10-CM | POA: Diagnosis not present

## 2018-10-26 DIAGNOSIS — E785 Hyperlipidemia, unspecified: Secondary | ICD-10-CM | POA: Diagnosis not present

## 2018-10-26 DIAGNOSIS — E1165 Type 2 diabetes mellitus with hyperglycemia: Secondary | ICD-10-CM | POA: Diagnosis not present

## 2018-10-26 DIAGNOSIS — R51 Headache: Secondary | ICD-10-CM | POA: Diagnosis not present

## 2018-10-26 DIAGNOSIS — R42 Dizziness and giddiness: Secondary | ICD-10-CM | POA: Diagnosis not present

## 2018-10-26 DIAGNOSIS — G44311 Acute post-traumatic headache, intractable: Secondary | ICD-10-CM | POA: Diagnosis not present

## 2018-10-26 DIAGNOSIS — R296 Repeated falls: Secondary | ICD-10-CM

## 2018-10-26 DIAGNOSIS — G4486 Cervicogenic headache: Secondary | ICD-10-CM

## 2018-10-26 DIAGNOSIS — R6883 Chills (without fever): Secondary | ICD-10-CM

## 2018-10-26 DIAGNOSIS — I4891 Unspecified atrial fibrillation: Secondary | ICD-10-CM

## 2018-10-26 LAB — COMPREHENSIVE METABOLIC PANEL
ALT: 18 U/L (ref 0–53)
AST: 17 U/L (ref 0–37)
Albumin: 4.6 g/dL (ref 3.5–5.2)
Alkaline Phosphatase: 100 U/L (ref 39–117)
BUN: 22 mg/dL (ref 6–23)
CALCIUM: 9.4 mg/dL (ref 8.4–10.5)
CO2: 24 meq/L (ref 19–32)
CREATININE: 1.39 mg/dL (ref 0.40–1.50)
Chloride: 101 mEq/L (ref 96–112)
GFR: 53.67 mL/min — ABNORMAL LOW (ref >60.00)
GLUCOSE: 247 mg/dL — AB (ref 70–99)
Potassium: 3.9 mEq/L (ref 3.5–5.1)
SODIUM: 136 meq/L (ref 135–145)
Total Bilirubin: 0.7 mg/dL (ref 0.2–1.2)
Total Protein: 7.1 g/dL (ref 6.0–8.3)

## 2018-10-26 LAB — TSH: TSH: 5.21 u[IU]/mL — AB (ref 0.35–4.50)

## 2018-10-26 LAB — URINALYSIS
BILIRUBIN URINE: NEGATIVE
Hgb urine dipstick: NEGATIVE
KETONES UR: NEGATIVE
Leukocytes, UA: NEGATIVE
Nitrite: NEGATIVE
Specific Gravity, Urine: 1.015 (ref 1.000–1.030)
Total Protein, Urine: NEGATIVE
UROBILINOGEN UA: 0.2 (ref 0.0–1.0)
Urine Glucose: >=1000 — AB
pH: 5.5 (ref 5.0–8.0)

## 2018-10-26 LAB — CBC WITH DIFFERENTIAL/PLATELET
BASOS ABS: 0 10*3/uL (ref 0.0–0.1)
BASOS PCT: 0.3 % (ref 0.0–3.0)
EOS ABS: 0.4 10*3/uL (ref 0.0–0.7)
Eosinophils Relative: 4.8 % (ref 0.0–5.0)
HEMATOCRIT: 40.9 % (ref 39.0–52.0)
Hemoglobin: 14.5 g/dL (ref 13.0–17.0)
LYMPHS PCT: 25.9 % (ref 12.0–46.0)
Lymphs Abs: 1.9 10*3/uL (ref 0.7–4.0)
MCHC: 35.5 g/dL (ref 30.0–36.0)
MCV: 85 fl (ref 78.0–100.0)
MONO ABS: 0.7 10*3/uL (ref 0.1–1.0)
Monocytes Relative: 9.2 % (ref 3.0–12.0)
NEUTROS PCT: 59.8 % (ref 43.0–77.0)
Neutro Abs: 4.5 10*3/uL (ref 1.4–7.7)
PLATELETS: 168 10*3/uL (ref 150.0–400.0)
RBC: 4.81 Mil/uL (ref 4.22–5.81)
RDW: 14.4 % (ref 11.5–15.5)
WBC: 7.4 10*3/uL (ref 4.0–10.5)

## 2018-10-26 LAB — HEMOGLOBIN A1C: Hgb A1c MFr Bld: 11.1 % — ABNORMAL HIGH (ref 4.6–6.5)

## 2018-10-26 LAB — LIPID PANEL
CHOL/HDL RATIO: 4
Cholesterol: 98 mg/dL (ref 0–200)
HDL: 22.6 mg/dL — ABNORMAL LOW (ref >39.00)
NONHDL: 75.46
TRIGLYCERIDES: 263 mg/dL — AB (ref 0.0–149.0)
VLDL: 52.6 mg/dL — ABNORMAL HIGH (ref 0.0–40.0)

## 2018-10-26 LAB — LDL CHOLESTEROL, DIRECT: LDL DIRECT: 46 mg/dL

## 2018-10-26 NOTE — Patient Instructions (Addendum)
Good Rx or CoverMyMeds can help Carbohydrate Counting for Diabetes Mellitus, Adult Carbohydrate counting is a method for keeping track of how many carbohydrates you eat. Eating carbohydrates naturally increases the amount of sugar (glucose) in the blood. Counting how many carbohydrates you eat helps keep your blood glucose within normal limits, which helps you manage your diabetes (diabetes mellitus). It is important to know how many carbohydrates you can safely have in each meal. This is different for every person. A diet and nutrition specialist (registered dietitian) can help you make a meal plan and calculate how many carbohydrates you should have at each meal and snack. Carbohydrates are found in the following foods:  Grains, such as breads and cereals.  Dried beans and soy products.  Starchy vegetables, such as potatoes, peas, and corn.  Fruit and fruit juices.  Milk and yogurt.  Sweets and snack foods, such as cake, cookies, candy, chips, and soft drinks.  How do I count carbohydrates? There are two ways to count carbohydrates in food. You can use either of the methods or a combination of both. Reading "Nutrition Facts" on packaged food The "Nutrition Facts" list is included on the labels of almost all packaged foods and beverages in the [place] It includes:  The serving size.  Information about nutrients in each serving, including the grams (g) of carbohydrate per serving.  To use the "Nutrition Facts":  Decide how many servings you will have.  Multiply the number of servings by the number of carbohydrates per serving.  The resulting number is the total amount of carbohydrates that you will be having.  Learning standard serving sizes of other foods When you eat foods containing carbohydrates that are not packaged or do not include "Nutrition Facts" on the label, you need to measure the servings in order to count the amount of carbohydrates:  Measure the foods that you will  eat with a food scale or measuring cup, if needed.  Decide how many standard-size servings you will eat.  Multiply the number of servings by 15. Most carbohydrate-rich foods have about 15 g of carbohydrates per serving. ? For example, if you eat 8 oz (170 g) of strawberries, you will have eaten 2 servings and 30 g of carbohydrates (2 servings x 15 g = 30 g).  For foods that have more than one food mixed, such as soups and casseroles, you must count the carbohydrates in each food that is included.  The following list contains standard serving sizes of common carbohydrate-rich foods. Each of these servings has about 15 g of carbohydrates:   hamburger bun or  English muffin.   oz (15 mL) syrup.   oz (14 g) jelly.  1 slice of bread.  1 six-inch tortilla.  3 oz (85 g) cooked rice or pasta.  4 oz (113 g) cooked dried beans.  4 oz (113 g) starchy vegetable, such as peas, corn, or potatoes.  4 oz (113 g) hot cereal.  4 oz (113 g) mashed potatoes or  of a large baked potato.  4 oz (113 g) canned or frozen fruit.  4 oz (120 mL) fruit juice.  4-6 crackers.  6 chicken nuggets.  6 oz (170 g) unsweetened dry cereal.  6 oz (170 g) plain fat-free yogurt or yogurt sweetened with artificial sweeteners.  8 oz (240 mL) milk.  8 oz (170 g) fresh fruit or one small piece of fruit.  24 oz (680 g) popped popcorn.  Example of carbohydrate counting Sample meal  3 oz (85 g) chicken breast.  6 oz (170 g) brown rice.  4 oz (113 g) corn.  8 oz (240 mL) milk.  8 oz (170 g) strawberries with sugar-free whipped topping. Carbohydrate calculation 1. Identify the foods that contain carbohydrates: ? Rice. ? Corn. ? Milk. ? Strawberries. 2. Calculate how many servings you have of each food: ? 2 servings rice. ? 1 serving corn. ? 1 serving milk. ? 1 serving strawberries. 3. Multiply each number of servings by 15 g: ? 2 servings rice x 15 g = 30 g. ? 1 serving corn x 15 g = 15  g. ? 1 serving milk x 15 g = 15 g. ? 1 serving strawberries x 15 g = 15 g. 4. Add together all of the amounts to find the total grams of carbohydrates eaten: ? 30 g + 15 g + 15 g + 15 g = 75 g of carbohydrates total. This information is not intended to replace advice given to you by your health care provider. Make sure you discuss any questions you have with your health care provider. Document Released: 11/22/2005 Document Revised: 06/11/2016 Document Reviewed: 05/05/2016 Elsevier Interactive Patient Education  2018 [place] H[employer]

## 2018-10-27 LAB — URINE CULTURE
MICRO NUMBER:: 91404931
Result:: NO GROWTH
SPECIMEN QUALITY: ADEQUATE

## 2018-10-29 NOTE — Assessment & Plan Note (Signed)
Encouraged increased hydration, 64 ounces of clear fluids daily. Minimize alcohol and caffeine. Eat small frequent meals with lean proteins and complex carbs. Avoid high and low blood sugars. Get adequate sleep, 7-8 hours a night. Needs exercise daily preferably in the morning. Referred to neurology for further consideration

## 2018-10-29 NOTE — Assessment & Plan Note (Signed)
Increase leafy greens, consider increased lean red meat and using cast iron cookware. Continue to monitor, report any concerns. Check oabs

## 2018-10-29 NOTE — Assessment & Plan Note (Signed)
Rate controlled 

## 2018-10-29 NOTE — Progress Notes (Signed)
Subjective:    Patient ID: [name], male    DOB: [birth year], 72 y.o.   MRN: [id number]  No chief complaint on file.    HPI Patient is in today for follow up. He is accompanied by his daughter and they note he has not been feeling well also. He notes fatigue and malaise has been having trouble keeping his sugars down. Denies CP/palp/SOB/HA/congestion/fevers/GI or GU c/o. Taking meds as prescribed   Past Medical History:  Diagnosis Date  . ACP (advance care planning) 05/11/2015  . Acquired atrophy of thyroid 06/05/2013   Overview:  July 2014: controlled on synthroid 93mcg since 2012 May 2015: decreased to Synthroid 50 mcg  Last Assessment & Plan:  Pt doing well with most recent TSH within normal range.  Will continue current dosage of Synthroid 81mcg. Pt reminded to take medication on empty stomach. Will continue to monitor with periodic laboratory assessment in 3 months.   . Advance care planning 05/01/2015  . Anemia    hemoglobin 7.4, iron deficiency, January, 2011, 2 unit transfusion, endoscopy normal, capsule endoscopy February, 2011 no small bowel abnormalities.   Most likely source gastric erosions, followed by GI  . Anxiety   . Arteriosclerosis of coronary artery 05/01/2013   Overview:  July 8366: Bystolic, Simvastatin, Imdur, ASA, and NTG CABG x 4 in 2000, stent placed 02/06/1998, 11/26/2008 Sep 2947: Changed Bystolic 10 to atenolol 25mg  (for cost), Continue Simvastatin, Imdur, ASA, and NTG  Last Assessment & Plan:  Change bystolic to atenolol (for cost) and continue others as pt doing well.   . Atrial fibrillation ([place]) [I48.91] 07/20/2016  . CAD (coronary artery disease)    A. CABG in 2000,status post cardiac cath in 2006, 2009 ....continued chest pain and SOB despite oral medication adjestments including Ranexa. B. Cath November 2009/ mRCA - 2.75 x 23 Abbott Xience V drug-eluting stent ...11/26/2008 to distal  RCA leading to acute marginal.  C. Cath 07/2012 for CP - stable anatomy,  med rx. d. cath 2015 and 05/30/2015 stable anatomy, consider Myoview if has CP again  . Carotid artery disease ([place]) 08/01/2015   Doppler, May 29, 2015, 1-39% bilateral ICA   . Cerebral ischemia    MRI November, 2010, chronic microvascular ischemia  . Chronic diastolic CHF (congestive heart failure) ([place]) 03/13/2016  . Concussion   . Coronary artery disease involving native coronary artery    status post coronary artery bypass graft in 2000,status post cardiac cath in 2006,  2009 ....continued chest pain and SOB despite oral medication adjestments including Ranexa.   Catheter November 2009/ DES  Stent - RCA...--> 11/26/2008 to distal  RCA leading to acute marginal.   Cath 01/26/2010 continued patentcy grafts and stent to RCA , Modest progression of distal disease...medical Rx/  /  Hospi  . Depression    Bipolar  . Diabetes mellitus type II, uncontrolled ([place])   . Diabetes mellitus without complication ([place])   . Diarrhea 02/15/2016  . Dizziness   . DM (diabetes mellitus), type 2 ([place]) 05/01/2013   Overview:  July 2014: metformin, Lantus & Novolog. Aug 2014: 40u Lantus nightly, 20-25u of Novolog prior to meals with metformin. Added Oseni 25mg /15mg  (worked but cost prohibitive) Jun 2015: 40u Lantus nightly, 10-15u of Novolog prior to meals with metformin. September 2015: Samples of Oseni 15/25 given for 28 days, referral to endocrinology Nov 2015: Samples of Toujeo give (4.35ml)  Last Assessme  . Edema   . Ejection fraction    EF 60%, echo,  July 31, 2012  . Essential hypertension 06/05/2013   Overview:  July 2014: Controlled with Bystolic Sep 5631: Changed to atenolol.  Last Assessment & Plan:  Will change to atenolol for cost and follow Improved.  Medication compliance strongly encouraged BP: 116/64 mmHg    Overview:  July 2014: Controlled with Bystolic Sep 4970: Changed to atenolol.  Last Assessment & Plan:  Will change to atenolol for cost and follow  . Exertional dyspnea 05/14/2016  . Falling episodes     these have occurred in the past and again recurring 2011  . Family history of adverse reaction to anesthesia    "mother died during bypass surgery but not sure if it has to do with anesthesia"  . Gastric ulcer   . GERD (gastroesophageal reflux disease)   . H/O medication noncompliance    Due to loss of insurance  . Hard of hearing   . Headache 01/25/2016  . History of blood transfusion 12/20/2013  . Hx of CABG    2000,  / one median sternotomy suture broken her chest x-ray November, 2010, no clinical significance  . Hyperlipidemia   . Hypertension    pt. denies  . Iron deficiency anemia   . Long term (current) use of anticoagulants [Z79.01] 07/20/2016  . Low back pain 06/12/2009   Qualifier: Diagnosis of  By: [name]   . Nausea with vomiting 01/25/2016  . Nephrolithiasis   . OSA (obstructive sleep apnea)   . Pain in limb 06/12/2009   Qualifier: Diagnosis of  By: [name]   . Palpitations    event recorder showed sinus rhythm  . RBBB 07/09/2009   Qualifier: Diagnosis of  By: [name], MD, [name] [name]   . RBBB (right bundle branch block)   . Restless leg   . Right knee pain 01/07/2015  . RLS (restless legs syndrome) 09/19/2009   Qualifier: Diagnosis of  By: [name]   Overview:  July 2014: Controlled with Mirapex  Last Assessment & Plan:  Patient is doing well. Will continue current management and follow clinically.  . Seizure disorder ([place]) 01/09/2017  . Shortness of breath    CPX April, 2011, mild functional limitation, no clear pulmonary or cardiac limitation, possible deconditioning and mild chronotropic incompetence( peak heart rate 130)  . Spondylosis    C5-6, C6-7 MRI 2010  . Syncope 03/2016  . Thyroid disease   . Tubulovillous adenoma of colon 2007  . Vitamin D deficiency 05/17/2017  . Wears glasses     Past Surgical History:  Procedure Laterality Date  . ANTERIOR CERVICAL DECOMP/DISCECTOMY FUSION N/A 05/31/2016   Procedure: ANTERIOR CERVICAL  DECOMPRESSION/DISCECTOMY FUSION CERVICAL FIVE-SIX,CERVICAL SIX-SEVEN;  Surgeon: [name], MD;  Location: [facility];  Service: Neurosurgery;  Laterality: N/A;  . CARDIAC CATHETERIZATION N/A 05/30/2015   Procedure: Left Heart Cath and Coronary Angiography;  Surgeon: [name], MD;  Location: [facility];  Service: Cardiovascular;  Laterality: N/A;  . COLONOSCOPY    . CORONARY ARTERY BYPASS GRAFT     2000  . ESOPHAGOGASTRODUODENOSCOPY    . LEFT HEART CATH AND CORS/GRAFTS ANGIOGRAPHY N/A 12/15/2017   Procedure: LEFT HEART CATH AND CORS/GRAFTS ANGIOGRAPHY;  Surgeon: [name], MD;  Location: [facility];  Service: Cardiovascular;  Laterality: N/A;  . LEFT HEART CATHETERIZATION WITH CORONARY/GRAFT ANGIOGRAM N/A 08/01/2012   Procedure: LEFT HEART CATHETERIZATION WITH Beatrix Fetters;  Surgeon: [name], MD;  Location[facility] CATH  LAB;  Service: Cardiovascular;  Laterality: N/A;  . LEFT HEART CATHETERIZATION WITH CORONARY/GRAFT ANGIOGRAM N/A 01/03/2015   Procedure: LEFT HEART CATHETERIZATION WITH Beatrix Fetters;  Surgeon: [name], MD;  Location[facility] C[facility] LAB;  Service: Cardiovascular;  Laterality: N/A;  . NASAL SEPTUM SURGERY     UP3  . PERCUTANEOUS CORONARY STENT INTERVENTION (PCI-S)  10/2008   mRCA PCI  2.75 x 23 Abbott Xience V drug-eluting stent   . ULTRASOUND GUIDANCE FOR VASCULAR ACCESS  12/15/2017   Procedure: Ultrasound Guidance For Vascular Access;  Surgeon: [name], MD;  Location: [facility] LAB;  Service: Cardiovascular;;    Family History  Problem Relation Age of Onset  . Pancreatic cancer Brother   . Diabetes Brother   . Coronary artery disease Brother   . Stroke Brother   . Diabetes Brother   . Diabetes Mother   . Heart failure Mother   . Heart failure Father   . Hypothyroidism Brother   . Coronary artery disease Brother   . Other Brother        colon surgery  . Heart attack Unknown        Nephew    . Irregular heart beat Daughter   . Cancer Maternal Grandmother        unknown     Social History   Socioeconomic History  . Marital status: Married    Spouse name: Not on file  . Number of children: 4  . Years of education: 62  . Highest education level: Not on file  Occupational History  . Occupation: retired  Scientific laboratory technician  . Financial resource strain: Not on file  . Food insecurity:    Worry: Not on file    Inability: Not on file  . Transportation needs:    Medical: Not on file    Non-medical: Not on file  Tobacco Use  . Smoking status: Never Smoker  . Smokeless tobacco: Never Used  Substance and Sexual Activity  . Alcohol use: No    Alcohol/week: 0.0 standard drinks    Comment: stopped drinking in 1998  . Drug use: No  . Sexual activity: Not on file  Lifestyle  . Physical activity:    Days per week: Not on file    Minutes per session: Not on file  . Stress: Not on file  Relationships  . Social connections:    Talks on phone: Not on file    Gets together: Not on file    Attends religious service: Not on file    Active member of club or organization: Not on file    Attends meetings of clubs or organizations: Not on file    Relationship status: Not on file  . Intimate partner violence:    Fear of current or ex partner: Not on file    Emotionally abused: Not on file    Physically abused: Not on file    Forced sexual activity: Not on file  Other Topics Concern  . Not on file  Social History Narrative   Patient is right handed.   Patient does not drink caffeine.    Outpatient Medications Prior to Visit  Medication Sig Dispense Refill  . amiodarone (PACERONE) 400 MG tablet Take 200 mg by mouth daily.    Marland Kitchen atorvastatin (LIPITOR) 40 MG tablet TAKE 1 & 1/2 TABLETS (60 MG TOTAL) BY MOUTH DAILY. (Patient taking differently: Take 60 mg by mouth daily at 6 PM. ) 45 tablet 3  . diclofenac  sodium (VOLTAREN) 1 % GEL Apply 2 g topically daily as needed (for pain in  affected area). 2 Tube 1  . divalproex (DEPAKOTE ER) 250 MG 24 hr tablet Take 3 tablets (750 mg total) by mouth at bedtime. 90 tablet 5  . furosemide (LASIX) 20 MG tablet Take 1 tablet (20 mg total) by mouth daily as needed for edema. 30 tablet 3  . glucose blood (ACCU-CHEK AVIVA) test strip Use as directed to check blood sugar 4 times daily.  DX E11.9 200 each 6  . insulin aspart (NOVOLOG FLEXPEN) 100 UNIT/ML FlexPen 3 times a day (just before each meal) 65-45-65, and pen needles 3/day (Patient taking differently: Inject 45-65 Units into the skin See admin instructions. Use 65 units every morning, use 45 units at lunch and use 65 units at dinner) 20 pen 11  . insulin NPH Human (HUMULIN N,NOVOLIN N) 100 UNIT/ML injection Inject 0.72 mLs (72 Units total) into the skin 2 (two) times daily. And syringes 1/day 130 mL 0  . isosorbide mononitrate (IMDUR) 60 MG 24 hr tablet TAKE TWO TABLETS BY MOUTH DAILY 180 tablet 0  . levothyroxine (SYNTHROID, LEVOTHROID) 88 MCG tablet TAKE 1 TABLET (88 MCG TOTAL) BY MOUTH DAILY BEFORE BREAKFAST. 30 tablet 6  . loperamide (IMODIUM A-D) 2 MG tablet Take 1 tablet (2 mg total) by mouth 4 (four) times daily as needed for diarrhea or loose stools. 30 tablet 0  . metoprolol succinate (TOPROL-XL) 50 MG 24 hr tablet TAKE 1&1/2 TABLETS BY MOUTH ONCE A DAY FOR A TOTAL OF 75 MG. (Patient taking differently: Take 75 mg by mouth daily. ) 45 tablet 6  . midodrine (PROAMATINE) 10 MG tablet TAKE 1 TABLET (10 MG TOTAL) BY MOUTH 3 TIMES DAILY. (Patient taking differently: Take 10 mg by mouth 3 (three) times daily. ) 90 tablet 9  . nitroGLYCERIN (NITROSTAT) 0.4 MG SL tablet PLACE 1 TABLET (0.4 MG TOTAL) UNDER THE TONGUE EVERY 5 (FIVE) MINUTES AS NEEDED FOR CHEST PAIN. 25 tablet 1  . ondansetron (ZOFRAN ODT) 4 MG disintegrating tablet Take 1 tablet (4 mg total) by mouth every 8 (eight) hours as needed for nausea or vomiting. 10 tablet 1  . pantoprazole (PROTONIX) 40 MG tablet TAKE 1 TABLET (40  MG TOTAL) BY MOUTH DAILY. 90 tablet 0  . pramipexole (MIRAPEX) 1 MG tablet TAKE 1/2 TO 1 TABLET BY MOUTH AT BEDTIME 30 tablet 2  . ranolazine (RANEXA) 500 MG 12 hr tablet Take 1 tablet (500 mg total) by mouth 2 (two) times daily. 14 tablet 0  . sertraline (ZOLOFT) 100 MG tablet TAKE 1 TABLET (100 MG TOTAL) BY MOUTH 2 (TWO) TIMES DAILY. 180 tablet 1  . Vitamin D, Ergocalciferol, (DRISDOL) 50000 units CAPS capsule TAKE 1 CAPSULE (50,000 UNITS TOTAL) BY MOUTH EVERY 7 (SEVEN) DAYS. 12 capsule 2  . warfarin (COUMADIN) 5 MG tablet TAKE AS DIRECTED BY COUMADIN CLINIC (Patient taking differently: Take 5 mg by mouth at bedtime. ) 45 tablet 4   No facility-administered medications prior to visit.     Allergies  Allergen Reactions  . Gabapentin Rash  . Morphine Other (See Comments)    hallucinations  . Shellfish-Derived Products Itching    Review of Systems  Constitutional: Positive for chills and malaise/fatigue. Negative for fever.  HENT: Negative for congestion.   Eyes: Negative for blurred vision.  Respiratory: Positive for shortness of breath.   Cardiovascular: Positive for palpitations. Negative for chest pain and leg swelling.  Gastrointestinal: Negative for abdominal  pain, blood in stool and nausea.  Genitourinary: Negative for dysuria and frequency.  Musculoskeletal: Positive for myalgias. Negative for falls.  Skin: Negative for rash.  Neurological: Negative for dizziness, loss of consciousness and headaches.  Endo/Heme/Allergies: Negative for environmental allergies.  Psychiatric/Behavioral: Negative for depression. The patient is not nervous/anxious.        Objective:    Physical Exam  Constitutional: He is oriented to person, place, and time. He appears well-developed and well-nourished. No distress.  HENT:  Head: Normocephalic and atraumatic.  Nose: Nose normal.  Eyes: Right eye exhibits no discharge. Left eye exhibits no discharge.  Neck: Normal range of motion. Neck  supple.  Cardiovascular: Normal rate and regular rhythm.  No murmur heard. Pulmonary/Chest: Effort normal and breath sounds normal.  Abdominal: Soft. Bowel sounds are normal. There is no tenderness.  Musculoskeletal: He exhibits no edema.  Neurological: He is alert and oriented to person, place, and time.  Skin: Skin is warm and dry.  Psychiatric: He has a normal mood and affect.  Nursing note and vitals reviewed.   BP 124/66 (BP Location: Left Arm, Patient Position: Sitting, Cuff Size: Normal)   Pulse 69   Temp 97.9 F (36.6 C) (Oral)   Resp 18   Wt 193 lb 3.2 oz (87.6 kg)   SpO2 98%   BMI 28.53 kg/m  Wt Readings from Last 3 Encounters:  10/26/18 193 lb 3.2 oz (87.6 kg)  08/21/18 201 lb 12.8 oz (91.5 kg)  07/31/18 196 lb (88.9 kg)     Lab Results  Component Value Date   WBC 7.4 10/26/2018   HGB 14.5 10/26/2018   HCT 40.9 10/26/2018   PLT 168.0 10/26/2018   GLUCOSE 247 (H) 10/26/2018   CHOL 98 10/26/2018   TRIG 263.0 (H) 10/26/2018   HDL 22.60 (L) 10/26/2018   LDLDIRECT 46.0 10/26/2018   LDLCALC 32 07/17/2018   ALT 18 10/26/2018   AST 17 10/26/2018   NA 136 10/26/2018   K 3.9 10/26/2018   CL 101 10/26/2018   CREATININE 1.39 10/26/2018   BUN 22 10/26/2018   CO2 24 10/26/2018   TSH 5.21 (H) 10/26/2018   PSA 2.39 11/25/2010   INR 1.9 (A) 10/12/2018   HGBA1C 11.1 (H) 10/26/2018   MICROALBUR 1.1 05/01/2015    Lab Results  Component Value Date   TSH 5.21 (H) 10/26/2018   Lab Results  Component Value Date   WBC 7.4 10/26/2018   HGB 14.5 10/26/2018   HCT 40.9 10/26/2018   MCV 85.0 10/26/2018   PLT 168.0 10/26/2018   Lab Results  Component Value Date   NA 136 10/26/2018   K 3.9 10/26/2018   CO2 24 10/26/2018   GLUCOSE 247 (H) 10/26/2018   BUN 22 10/26/2018   CREATININE 1.39 10/26/2018   BILITOT 0.7 10/26/2018   ALKPHOS 100 10/26/2018   AST 17 10/26/2018   ALT 18 10/26/2018   PROT 7.1 10/26/2018   ALBUMIN 4.6 10/26/2018   CALCIUM 9.4 10/26/2018     ANIONGAP 11 07/31/2018   GFR 53.67 (L) 10/26/2018   Lab Results  Component Value Date   CHOL 98 10/26/2018   Lab Results  Component Value Date   HDL 22.60 (L) 10/26/2018   Lab Results  Component Value Date   LDLCALC 32 07/17/2018   Lab Results  Component Value Date   TRIG 263.0 (H) 10/26/2018   Lab Results  Component Value Date   CHOLHDL 4 10/26/2018   Lab Results  Component Value  Date   HGBA1C 11.1 (H) 10/26/2018       Assessment & Plan:   Problem List Items Addressed This Visit    Dizziness   Relevant Orders   Ambulatory referral to Neurology   Diabetes mellitus type II, uncontrolled ([place]) (Chronic)   Relevant Orders   Hemoglobin A1c (Completed)   Comprehensive metabolic panel (Completed)   Anemia    Increase leafy greens, consider increased lean red meat and using cast iron cookware. Continue to monitor, report any concerns. Check oabs      OSA (obstructive sleep apnea) - Primary   Relevant Orders   Ambulatory referral to Neurology   Hyperlipidemia LDL goal <70   Relevant Orders   Lipid panel (Completed)   Essential hypertension   Relevant Orders   CBC with Differential/Platelet (Completed)   TSH (Completed)   Falling episodes   Relevant Orders   Ambulatory referral to Neurology   Post-traumatic headache   Relevant Orders   Ambulatory referral to Neurology   Cervicogenic headache    Encouraged increased hydration, 64 ounces of clear fluids daily. Minimize alcohol and caffeine. Eat small frequent meals with lean proteins and complex carbs. Avoid high and low blood sugars. Get adequate sleep, 7-8 hours a night. Needs exercise daily preferably in the morning. Referred to neurology for further consideration      Relevant Orders   Ambulatory referral to Neurology   Chronic diastolic CHF (congestive heart failure) ([place])    No obvious exacerbation recently      Atrial fibrillation ([place]) [I48.91]    Rate controlled.        Other Visit Diagnoses     Chills       Relevant Orders   Urinalysis (Completed)   Urine Culture (Completed)      I am having Emmanuel A. Bena maintain his ondansetron, loperamide, amiodarone, glucose blood, ranolazine, sertraline, insulin aspart, metoprolol succinate, divalproex, atorvastatin, diclofenac sodium, nitroGLYCERIN, warfarin, midodrine, furosemide, insulin NPH Human, Vitamin D (Ergocalciferol), isosorbide mononitrate, pramipexole, levothyroxine, and pantoprazole.  No orders of the defined types were placed in this encounter.    Penni Homans, MD

## 2018-10-29 NOTE — Assessment & Plan Note (Signed)
No obvious exacerbation recently

## 2018-10-30 ENCOUNTER — Telehealth: Payer: Self-pay

## 2018-10-30 MED ORDER — LEVOTHYROXINE SODIUM 100 MCG PO TABS: 100.0000 ug | ORAL_TABLET | Freq: Every day | ORAL | 1 refills | Status: DC

## 2018-10-30 MED FILL — LEVOTHYROXINE 100 MCG TAB: 100 | 90 days supply | Qty: 90 | Fill #0

## 2018-10-30 NOTE — Telephone Encounter (Signed)
Patient called for his lab results, is not able to get on mychart to look at messages at this time. Advised patient of results and provider's recommendations as noted on results. Rx sent for new strength levothyroxine 100 mcg and advised to increase insulin by 2 units.

## 2018-11-01 MED FILL — PRAMIPEXOLE 1 MG TABLET: 1 | 30 days supply | Qty: 30 | Fill #0

## 2018-11-06 ENCOUNTER — Observation Stay (HOSPITAL_COMMUNITY)
Admission: EM | Admit: 2018-11-06 | Discharge: 2018-11-07 | Disposition: A | Discharge status: Home / Self Care | Payer: Medicare PPO | Attending: Internal Medicine | Admitting: Internal Medicine

## 2018-11-06 ENCOUNTER — Emergency Department (HOSPITAL_COMMUNITY): Payer: Medicare PPO

## 2018-11-06 ENCOUNTER — Encounter (HOSPITAL_COMMUNITY): Payer: Self-pay | Admitting: Emergency Medicine

## 2018-11-06 ENCOUNTER — Other Ambulatory Visit: Payer: Self-pay

## 2018-11-06 DIAGNOSIS — E1122 Type 2 diabetes mellitus with diabetic chronic kidney disease: Secondary | ICD-10-CM | POA: Insufficient documentation

## 2018-11-06 DIAGNOSIS — R569 Unspecified convulsions: Secondary | ICD-10-CM | POA: Diagnosis not present

## 2018-11-06 DIAGNOSIS — R0902 Hypoxemia: Secondary | ICD-10-CM | POA: Diagnosis not present

## 2018-11-06 DIAGNOSIS — N183 Chronic kidney disease, stage 3 unspecified: Secondary | ICD-10-CM | POA: Diagnosis present

## 2018-11-06 DIAGNOSIS — W19XXXA Unspecified fall, initial encounter: Secondary | ICD-10-CM | POA: Diagnosis not present

## 2018-11-06 DIAGNOSIS — G40909 Epilepsy, unspecified, not intractable, without status epilepticus: Secondary | ICD-10-CM

## 2018-11-06 DIAGNOSIS — I2510 Atherosclerotic heart disease of native coronary artery without angina pectoris: Secondary | ICD-10-CM | POA: Diagnosis not present

## 2018-11-06 DIAGNOSIS — S199XXA Unspecified injury of neck, initial encounter: Secondary | ICD-10-CM | POA: Diagnosis not present

## 2018-11-06 DIAGNOSIS — R079 Chest pain, unspecified: Secondary | ICD-10-CM | POA: Diagnosis not present

## 2018-11-06 DIAGNOSIS — Z7901 Long term (current) use of anticoagulants: Secondary | ICD-10-CM | POA: Diagnosis not present

## 2018-11-06 DIAGNOSIS — I4891 Unspecified atrial fibrillation: Secondary | ICD-10-CM | POA: Diagnosis not present

## 2018-11-06 DIAGNOSIS — E785 Hyperlipidemia, unspecified: Secondary | ICD-10-CM | POA: Diagnosis present

## 2018-11-06 DIAGNOSIS — Z794 Long term (current) use of insulin: Secondary | ICD-10-CM | POA: Insufficient documentation

## 2018-11-06 DIAGNOSIS — I480 Paroxysmal atrial fibrillation: Secondary | ICD-10-CM | POA: Diagnosis present

## 2018-11-06 DIAGNOSIS — Z951 Presence of aortocoronary bypass graft: Secondary | ICD-10-CM | POA: Insufficient documentation

## 2018-11-06 DIAGNOSIS — E034 Atrophy of thyroid (acquired): Secondary | ICD-10-CM | POA: Diagnosis not present

## 2018-11-06 DIAGNOSIS — I130 Hypertensive heart and chronic kidney disease with heart failure and stage 1 through stage 4 chronic kidney disease, or unspecified chronic kidney disease: Secondary | ICD-10-CM | POA: Insufficient documentation

## 2018-11-06 DIAGNOSIS — I5032 Chronic diastolic (congestive) heart failure: Secondary | ICD-10-CM | POA: Diagnosis present

## 2018-11-06 DIAGNOSIS — Y92009 Unspecified place in unspecified non-institutional (private) residence as the place of occurrence of the external cause: Secondary | ICD-10-CM

## 2018-11-06 DIAGNOSIS — R0602 Shortness of breath: Secondary | ICD-10-CM | POA: Diagnosis not present

## 2018-11-06 DIAGNOSIS — S0990XA Unspecified injury of head, initial encounter: Secondary | ICD-10-CM | POA: Diagnosis not present

## 2018-11-06 DIAGNOSIS — G4733 Obstructive sleep apnea (adult) (pediatric): Secondary | ICD-10-CM | POA: Diagnosis not present

## 2018-11-06 DIAGNOSIS — I4510 Unspecified right bundle-branch block: Secondary | ICD-10-CM | POA: Diagnosis not present

## 2018-11-06 DIAGNOSIS — I10 Essential (primary) hypertension: Secondary | ICD-10-CM

## 2018-11-06 DIAGNOSIS — G2581 Restless legs syndrome: Secondary | ICD-10-CM | POA: Diagnosis not present

## 2018-11-06 LAB — CBC WITH DIFFERENTIAL/PLATELET
Abs Immature Granulocytes: 0.05 10*3/uL (ref 0.00–0.07)
Basophils Absolute: 0 10*3/uL (ref 0.0–0.1)
Basophils Relative: 0 %
Eosinophils Absolute: 0.1 10*3/uL (ref 0.0–0.5)
Eosinophils Relative: 1 %
HCT: 40.9 % (ref 39.0–52.0)
Hemoglobin: 14 g/dL (ref 13.0–17.0)
Immature Granulocytes: 1 %
LYMPHS PCT: 16 %
Lymphs Abs: 1.7 10*3/uL (ref 0.7–4.0)
MCH: 29.4 pg (ref 26.0–34.0)
MCHC: 34.2 g/dL (ref 30.0–36.0)
MCV: 85.7 fL (ref 80.0–100.0)
Monocytes Absolute: 0.8 10*3/uL (ref 0.1–1.0)
Monocytes Relative: 7 %
Neutro Abs: 7.9 10*3/uL — ABNORMAL HIGH (ref 1.7–7.7)
Neutrophils Relative %: 75 %
Platelets: 165 10*3/uL (ref 150–400)
RBC: 4.77 MIL/uL (ref 4.22–5.81)
RDW: 14 % (ref 11.5–15.5)
WBC: 10.5 10*3/uL (ref 4.0–10.5)
nRBC: 0 % (ref 0.0–0.2)

## 2018-11-06 LAB — COMPREHENSIVE METABOLIC PANEL
ALT: 24 U/L (ref 0–44)
AST: 27 U/L (ref 15–41)
Albumin: 4.1 g/dL (ref 3.5–5.0)
Alkaline Phosphatase: 85 U/L (ref 38–126)
Anion gap: 13 (ref 5–15)
BUN: 12 mg/dL (ref 8–23)
CO2: 21 mmol/L — ABNORMAL LOW (ref 22–32)
Calcium: 9.4 mg/dL (ref 8.9–10.3)
Chloride: 101 mmol/L (ref 98–111)
Creatinine, Ser: 1.27 mg/dL — ABNORMAL HIGH (ref 0.61–1.24)
GFR calc Af Amer: >60 mL/min (ref >60)
GFR calc non Af Amer: 57 mL/min — ABNORMAL LOW (ref >60)
GLUCOSE: 219 mg/dL — AB (ref 70–99)
Potassium: 3.7 mmol/L (ref 3.5–5.1)
Sodium: 135 mmol/L (ref 135–145)
Total Bilirubin: 0.9 mg/dL (ref 0.3–1.2)
Total Protein: 7.1 g/dL (ref 6.5–8.1)

## 2018-11-06 LAB — GLUCOSE, CAPILLARY
GLUCOSE-CAPILLARY: 365 mg/dL — AB (ref 70–99)
Glucose-Capillary: 293 mg/dL — ABNORMAL HIGH (ref 70–99)
Glucose-Capillary: 380 mg/dL — ABNORMAL HIGH (ref 70–99)

## 2018-11-06 LAB — I-STAT TROPONIN, ED: Troponin i, poc: 0 ng/mL (ref 0.00–0.08)

## 2018-11-06 LAB — TROPONIN I
Troponin I: <0.03 ng/mL (ref <0.03)
Troponin I: <0.03 ng/mL (ref <0.03)
Troponin I: <0.03 ng/mL (ref <0.03)

## 2018-11-06 LAB — PROTIME-INR
INR: 2.77
Prothrombin Time: 28.9 seconds — ABNORMAL HIGH (ref 11.4–15.2)

## 2018-11-06 LAB — HIV ANTIBODY (ROUTINE TESTING W REFLEX): HIV Screen 4th Generation wRfx: NONREACTIVE

## 2018-11-06 LAB — BRAIN NATRIURETIC PEPTIDE: B NATRIURETIC PEPTIDE 5: 18.5 pg/mL (ref 0.0–100.0)

## 2018-11-06 LAB — VALPROIC ACID LEVEL: Valproic Acid Lvl: <10 ug/mL — ABNORMAL LOW (ref 50.0–100.0)

## 2018-11-06 MED ORDER — PANTOPRAZOLE SODIUM 40 MG PO TBEC
40.0000 mg | DELAYED_RELEASE_TABLET | Freq: Every day | ORAL | Status: DC
  Administered 2018-11-06 – 2018-11-07 (×2): 40 mg via ORAL
  Filled 2018-11-06 (×2): qty 1

## 2018-11-06 MED ORDER — ACETAMINOPHEN 500 MG PO TABS
1000.0000 mg | ORAL_TABLET | Freq: Once | ORAL | Status: AC
  Administered 2018-11-06: 1000 mg via ORAL
  Filled 2018-11-06: qty 2

## 2018-11-06 MED ORDER — RANOLAZINE ER 500 MG PO TB12
500.0000 mg | ORAL_TABLET | Freq: Two times a day (BID) | ORAL | Status: DC
  Administered 2018-11-06 – 2018-11-07 (×3): 500 mg via ORAL
  Filled 2018-11-06 (×3): qty 1

## 2018-11-06 MED ORDER — ACETAMINOPHEN 650 MG RE SUPP: 650.0000 mg | Freq: Four times a day (QID) | RECTAL | Status: DC | PRN

## 2018-11-06 MED ORDER — VALPROATE SODIUM 500 MG/5ML IV SOLN
750.0000 mg | Freq: Once | INTRAVENOUS | Status: AC
  Administered 2018-11-06: 750 mg via INTRAVENOUS
  Filled 2018-11-06 (×2): qty 7.5

## 2018-11-06 MED ORDER — ZOLPIDEM TARTRATE 5 MG PO TABS: 5.0000 mg | ORAL_TABLET | Freq: Every evening | ORAL | Status: DC | PRN

## 2018-11-06 MED ORDER — SODIUM CHLORIDE 0.9 % IV SOLN
INTRAVENOUS | Status: DC | PRN
  Administered 2018-11-06: 1000 mL via INTRAVENOUS

## 2018-11-06 MED ORDER — DIVALPROEX SODIUM ER 500 MG PO TB24: 750.0000 mg | ORAL_TABLET | Freq: Every day | ORAL | Status: DC

## 2018-11-06 MED ORDER — ACETAMINOPHEN 325 MG PO TABS
650.0000 mg | ORAL_TABLET | Freq: Four times a day (QID) | ORAL | Status: DC | PRN
  Administered 2018-11-06: 650 mg via ORAL
  Filled 2018-11-06 (×2): qty 2

## 2018-11-06 MED ORDER — SERTRALINE HCL 100 MG PO TABS
100.0000 mg | ORAL_TABLET | Freq: Two times a day (BID) | ORAL | Status: DC
  Administered 2018-11-06 – 2018-11-07 (×3): 100 mg via ORAL
  Filled 2018-11-06 (×3): qty 1

## 2018-11-06 MED ORDER — RANOLAZINE ER 500 MG PO TB12: 500.0000 mg | ORAL_TABLET | Freq: Two times a day (BID) | ORAL | Status: DC

## 2018-11-06 MED ORDER — NITROGLYCERIN 0.4 MG SL SUBL: 0.4000 mg | SUBLINGUAL_TABLET | SUBLINGUAL | Status: DC | PRN

## 2018-11-06 MED ORDER — WARFARIN - PHARMACIST DOSING INPATIENT
Freq: Every day | Status: DC
  Administered 2018-11-06: 18:00:00

## 2018-11-06 MED ORDER — LORAZEPAM 2 MG/ML IJ SOLN: 1.0000 mg | INTRAMUSCULAR | Status: DC | PRN

## 2018-11-06 MED ORDER — INSULIN ASPART 100 UNIT/ML ~~LOC~~ SOLN
0.0000 [IU] | Freq: Three times a day (TID) | SUBCUTANEOUS | Status: DC
  Administered 2018-11-06 (×2): 9 [IU] via SUBCUTANEOUS
  Administered 2018-11-06 – 2018-11-07 (×2): 5 [IU] via SUBCUTANEOUS

## 2018-11-06 MED ORDER — ATORVASTATIN CALCIUM 40 MG PO TABS
60.0000 mg | ORAL_TABLET | Freq: Every day | ORAL | Status: DC
  Administered 2018-11-06: 60 mg via ORAL
  Filled 2018-11-06: qty 1

## 2018-11-06 MED ORDER — SENNOSIDES-DOCUSATE SODIUM 8.6-50 MG PO TABS: 1.0000 | ORAL_TABLET | Freq: Every evening | ORAL | Status: DC | PRN

## 2018-11-06 MED ORDER — ONDANSETRON HCL 4 MG PO TABS: 4.0000 mg | ORAL_TABLET | Freq: Four times a day (QID) | ORAL | Status: DC | PRN

## 2018-11-06 MED ORDER — INSULIN NPH (HUMAN) (ISOPHANE) 100 UNIT/ML ~~LOC~~ SUSP
50.0000 [IU] | Freq: Two times a day (BID) | SUBCUTANEOUS | Status: DC
  Administered 2018-11-06 – 2018-11-07 (×3): 50 [IU] via SUBCUTANEOUS
  Filled 2018-11-06 (×3): qty 10

## 2018-11-06 MED ORDER — METOPROLOL SUCCINATE ER 50 MG PO TB24
75.0000 mg | ORAL_TABLET | Freq: Every day | ORAL | Status: DC
  Administered 2018-11-06 – 2018-11-07 (×2): 75 mg via ORAL
  Filled 2018-11-06 (×2): qty 1

## 2018-11-06 MED ORDER — WARFARIN SODIUM 5 MG PO TABS
5.0000 mg | ORAL_TABLET | Freq: Once | ORAL | Status: AC
  Administered 2018-11-06: 5 mg via ORAL
  Filled 2018-11-06: qty 1

## 2018-11-06 MED ORDER — LEVOTHYROXINE SODIUM 100 MCG PO TABS
100.0000 ug | ORAL_TABLET | ORAL | Status: DC
  Administered 2018-11-06 – 2018-11-07 (×2): 100 ug via ORAL
  Filled 2018-11-06 (×3): qty 1

## 2018-11-06 MED ORDER — ISOSORBIDE MONONITRATE ER 30 MG PO TB24
120.0000 mg | ORAL_TABLET | Freq: Every day | ORAL | Status: DC
  Administered 2018-11-06: 120 mg via ORAL
  Filled 2018-11-06 (×2): qty 4

## 2018-11-06 MED ORDER — PRAMIPEXOLE DIHYDROCHLORIDE 0.25 MG PO TABS
0.5000 mg | ORAL_TABLET | Freq: Every day | ORAL | Status: DC
  Administered 2018-11-06: 1 mg via ORAL
  Filled 2018-11-06: qty 4

## 2018-11-06 MED ORDER — ONDANSETRON HCL 4 MG/2ML IJ SOLN: 4.0000 mg | Freq: Four times a day (QID) | INTRAMUSCULAR | Status: DC | PRN

## 2018-11-06 MED ORDER — FUROSEMIDE 20 MG PO TABS: 20.0000 mg | ORAL_TABLET | Freq: Every day | ORAL | Status: DC | PRN

## 2018-11-06 MED ORDER — HYDRALAZINE HCL 20 MG/ML IJ SOLN: 5.0000 mg | INTRAMUSCULAR | Status: DC | PRN

## 2018-11-06 MED ORDER — DIVALPROEX SODIUM ER 500 MG PO TB24
750.0000 mg | ORAL_TABLET | Freq: Every day | ORAL | Status: DC
  Administered 2018-11-06: 750 mg via ORAL
  Filled 2018-11-06: qty 1

## 2018-11-06 NOTE — Care Management Note (Signed)
Case Management Note  Patient Details  Name: THANH MOTTERN MRN: [id number] Date of Birth: February 13, 1946  Subjective/Objective:     Pt admitted with seizures. He is from home with spouse.  DME: CPAP, walker Pt has issues obtaining his medications when he enters the donut hole. Pt's wife states they get into the donut hole about the end of February and got out this year in October.  Issues with transportation if daughters are not available to transport.                Action/Plan: CM met with the patient and his spouse. They have [employer] Medicare and there really is not much assistance that can be provided. CM spoke with [place] and they state pt can use discount cards to assist with the cost of his insulins during the donut hole but it will put them behind getting out of the hole. CM informed them of this and provided them coupon cards. [facility] also stated them using the mail order through [facility] will help a great deal with the cost of his medications. CM encouraged them to use mail order.  Currently the patient is out of the donut hole so should be able to get the meds he needs this week when he gets his check.  Pt can also use the [employer] brand of Novolin N through [employer] to get assistance with the cost of his long acting insulin. Wife and patient made aware so they could potentially get this changed when he gets into the donut hole next year.   Expected Discharge Date:                  Expected Discharge Plan:  Home/Self Care  In-House Referral:     Discharge planning Services  CM Consult(med assistance)  Post Acute Care Choice:    Choice offered to:     DME Arranged:    DME Agency:     HH Arranged:    HH Agency:     Status of Service:  Completed, signed off  If discussed at [employer] of Stay Meetings, dates discussed:    Additional Comments:  Pollie Friar, RN 11/06/2018, 12:33 PM

## 2018-11-06 NOTE — H&P (Signed)
History and Physical    [name] [name] WCB:[id number] DOB: May 18, 1946 DOA: 11/06/2018  Referring MD/NP/PA:   PCP: [name], MD   Patient coming from:  The patient is coming from home.  At baseline, pt is independent for most of ADL.        Chief Complaint: Seizure  HPI: [name] [name] is a 72 y.o. male with medical history significant of seizure, hypertension, hyperlipidemia, diabetes mellitus, CAD, CABG, atrial fibrillation on Coumadin, dCHF, gastric ulcer, hard of hearing, OSA on CPAP, right bundle blockage, RLS, CKD-3, depression with anxiety, medication noncompliance, who presents with seizure.  Patient states that due to financial difficulty, he did not get his Depakote refill, not taking Depakote for more than 3 weeks.  He had one episode of seizure in the midnight and fell, injured back of head. He has mild pain in back of head. His wife witnessed, he cannot give details about his seizure.  He has nausea and vomited once after seizure.  Patient does not have any abdominal pain, nausea, vomiting or diarrhea now.  Currently patient is alert, oriented x3.  No unilateral weakness, numbness or tingling in his extremities.  No facial droop or slurred speech. Patient states that recently he has been having intermittent mild chest pain in the past 3 weeks, which is located in the substernal area, 3 out of 10 severity, nonradiating, pressure-like.  Currently patient does not have any chest pain.  He states that when he has chest pain, he also experienced shortness of breath, which is exertional.  Currently patient does not have shortness of breath.  He has mild dry cough, but no fever or chills.  Patient denies any symptoms of UTI. Of note, in addition to not taking Depakote, he is also not taking Ranexa, metoprolol, Lipitor and Imdur currently.   ED Course: pt was found to have negative troponin, INR 2.77, WBC 10.5, stable renal function, pending urinalysis, temperature normal, no  tachycardia, oxygen saturation 95% on room air.  Pending urinalysis and Depakote level. Chest x-ray negative.  CT of head is negative for acute intracranial abnormalities.  CT of C-spine is negative for traumatic bony abnormalities patient is placed on telemetry bed for observation. Review of Systems:   General: no fevers, chills, no body weight gain,has fatigue HEENT: no blurry vision, hearing changes or sore throat Respiratory: has dyspnea, coughing, no wheezing CV: has chest pain, no palpitations GI: no nausea, vomiting, abdominal pain, diarrhea, constipation GU: no dysuria, burning on urination, increased urinary frequency, hematuria  Ext: no leg edema Neuro: no unilateral weakness, numbness, or tingling, no vision change or hearing loss. Had seizure and fall. Skin: no rash, no skin tear.  MSK: No muscle spasm, no deformity, no limitation of range of movement in spin Heme: No easy bruising.  Travel history: No recent long distant travel.  Allergy:  Allergies  Allergen Reactions  . Gabapentin Rash  . Morphine Other (See Comments)    hallucinations  . Shellfish-Derived Products Itching    Past Medical History:  Diagnosis Date  . ACP (advance care planning) 05/11/2015  . Acquired atrophy of thyroid 06/05/2013   Overview:  July 2014: controlled on synthroid 64mcg since 2012 May 2015: decreased to Synthroid 50 mcg  Last Assessment & Plan:  Pt doing well with most recent TSH within normal range.  Will continue current dosage of Synthroid 35mcg. Pt reminded to take medication on empty stomach. Will continue to monitor with periodic laboratory assessment in 3 months.   Marland Kitchen  Advance care planning 05/01/2015  . Anemia    hemoglobin 7.4, iron deficiency, January, 2011, 2 unit transfusion, endoscopy normal, capsule endoscopy February, 2011 no small bowel abnormalities.   Most likely source gastric erosions, followed by GI  . Anxiety   . Arteriosclerosis of coronary artery 05/01/2013   Overview:   July 4098: Bystolic, Simvastatin, Imdur, ASA, and NTG CABG x 4 in 2000, stent placed 02/06/1998, 11/26/2008 Sep 1191: Changed Bystolic 10 to atenolol 25mg  (for cost), Continue Simvastatin, Imdur, ASA, and NTG  Last Assessment & Plan:  Change bystolic to atenolol (for cost) and continue others as pt doing well.   . Atrial fibrillation ([place]) [I48.91] 07/20/2016  . CAD (coronary artery disease)    A. CABG in 2000,status post cardiac cath in 2006, 2009 ....continued chest pain and SOB despite oral medication adjestments including Ranexa. B. Cath November 2009/ mRCA - 2.75 x 23 Abbott Xience V drug-eluting stent ...11/26/2008 to distal  RCA leading to acute marginal.  C. Cath 07/2012 for CP - stable anatomy, med rx. d. cath 2015 and 05/30/2015 stable anatomy, consider Myoview if has CP again  . Carotid artery disease ([place]) 08/01/2015   Doppler, May 29, 2015, 1-39% bilateral ICA   . Cerebral ischemia    MRI November, 2010, chronic microvascular ischemia  . Chronic diastolic CHF (congestive heart failure) ([place]) 03/13/2016  . Concussion   . Coronary artery disease involving native coronary artery    status post coronary artery bypass graft in 2000,status post cardiac cath in 2006,  2009 ....continued chest pain and SOB despite oral medication adjestments including Ranexa.   Catheter November 2009/ DES  Stent - RCA...--> 11/26/2008 to distal  RCA leading to acute marginal.   Cath 01/26/2010 continued patentcy grafts and stent to RCA , Modest progression of distal disease...medical Rx/  /  Hospi  . Depression    Bipolar  . Diabetes mellitus type II, uncontrolled ([place])   . Diabetes mellitus without complication ([place])   . Diarrhea 02/15/2016  . Dizziness   . DM (diabetes mellitus), type 2 ([place]) 05/01/2013   Overview:  July 2014: metformin, Lantus & Novolog. Aug 2014: 40u Lantus nightly, 20-25u of Novolog prior to meals with metformin. Added Oseni 25mg /15mg  (worked but cost prohibitive) Jun 2015: 40u Lantus nightly,  10-15u of Novolog prior to meals with metformin. September 2015: Samples of Oseni 15/25 given for 28 days, referral to endocrinology Nov 2015: Samples of Toujeo give (4.45ml)  Last Assessme  . Edema   . Ejection fraction    EF 60%, echo, July 31, 2012  . Essential hypertension 06/05/2013   Overview:  July 2014: Controlled with Bystolic Sep 4782: Changed to atenolol.  Last Assessment & Plan:  Will change to atenolol for cost and follow Improved.  Medication compliance strongly encouraged BP: 116/64 mmHg    Overview:  July 2014: Controlled with Bystolic Sep 9562: Changed to atenolol.  Last Assessment & Plan:  Will change to atenolol for cost and follow  . Exertional dyspnea 05/14/2016  . Falling episodes    these have occurred in the past and again recurring 2011  . Family history of adverse reaction to anesthesia    "mother died during bypass surgery but not sure if it has to do with anesthesia"  . Gastric ulcer   . GERD (gastroesophageal reflux disease)   . H/O medication noncompliance    Due to loss of insurance  . Hard of hearing   . Headache 01/25/2016  . History of blood  transfusion 12/20/2013  . Hx of CABG    2000,  / one median sternotomy suture broken her chest x-ray November, 2010, no clinical significance  . Hyperlipidemia   . Hypertension    pt. denies  . Iron deficiency anemia   . Long term (current) use of anticoagulants [Z79.01] 07/20/2016  . Low back pain 06/12/2009   Qualifier: Diagnosis of  By: [name]   . Nausea with vomiting 01/25/2016  . Nephrolithiasis   . OSA (obstructive sleep apnea)   . Pain in limb 06/12/2009   Qualifier: Diagnosis of  By: [name]   . Palpitations    event recorder showed sinus rhythm  . RBBB 07/09/2009   Qualifier: Diagnosis of  By: [name], MD, [name] [name]   . RBBB (right bundle branch block)   . Restless leg   . Right knee pain 01/07/2015  . RLS (restless legs syndrome) 09/19/2009   Qualifier: Diagnosis of  By: [name]   Overview:  July 2014: Controlled with Mirapex  Last Assessment & Plan:  Patient is doing well. Will continue current management and follow clinically.  . Seizure disorder ([place]) 01/09/2017  . Shortness of breath    CPX April, 2011, mild functional limitation, no clear pulmonary or cardiac limitation, possible deconditioning and mild chronotropic incompetence( peak heart rate 130)  . Spondylosis    C5-6, C6-7 MRI 2010  . Syncope 03/2016  . Thyroid disease   . Tubulovillous adenoma of colon 2007  . Vitamin D deficiency 05/17/2017  . Wears glasses     Past Surgical History:  Procedure Laterality Date  . ANTERIOR CERVICAL DECOMP/DISCECTOMY FUSION N/A 05/31/2016   Procedure: ANTERIOR CERVICAL DECOMPRESSION/DISCECTOMY FUSION CERVICAL FIVE-SIX,CERVICAL SIX-SEVEN;  Surgeon: [name], MD;  Location: [facility];  Service: Neurosurgery;  Laterality: N/A;  . CARDIAC CATHETERIZATION N/A 05/30/2015   Procedure: Left Heart Cath and Coronary Angiography;  Surgeon: [name], MD;  Location: [facility];  Service: Cardiovascular;  Laterality: N/A;  . COLONOSCOPY    . CORONARY ARTERY BYPASS GRAFT     2000  . ESOPHAGOGASTRODUODENOSCOPY    . LEFT HEART CATH AND CORS/GRAFTS ANGIOGRAPHY N/A 12/15/2017   Procedure: LEFT HEART CATH AND CORS/GRAFTS ANGIOGRAPHY;  Surgeon: [name], MD;  Location: [facility];  Service: Cardiovascular;  Laterality: N/A;  . LEFT HEART CATHETERIZATION WITH CORONARY/GRAFT ANGIOGRAM N/A 08/01/2012   Procedure: LEFT HEART CATHETERIZATION WITH Beatrix Fetters;  Surgeon: [name], MD;  Location: [facility] CATH LAB;  Service: Cardiovascular;  Laterality: N/A;  . LEFT HEART CATHETERIZATION WITH CORONARY/GRAFT ANGIOGRAM N/A 01/03/2015   Procedure: LEFT HEART CATHETERIZATION WITH Beatrix Fetters;  Surgeon: [name], MD;  Location[facility] CATH LAB;  Service: Cardiovascular;  Laterality: N/A;  . NASAL SEPTUM SURGERY     UP3  .  PERCUTANEOUS CORONARY STENT INTERVENTION (PCI-S)  10/2008   mRCA PCI  2.75 x 23 Abbott Xience V drug-eluting stent   . ULTRASOUND GUIDANCE FOR VASCULAR ACCESS  12/15/2017   Procedure: Ultrasound Guidance For Vascular Access;  Surgeon: [name], MD;  Location: [place] [facility] LAB;  Service: Cardiovascular;;    Social History:  reports that he has never smoked. He has never used smokeless tobacco. He reports that he does not drink alcohol or use drugs.  Family History:  Family History  Problem Relation Age of Onset  . Pancreatic cancer Brother   . Diabetes Brother   . Coronary  artery disease Brother   . Stroke Brother   . Diabetes Brother   . Diabetes Mother   . Heart failure Mother   . Heart failure Father   . Hypothyroidism Brother   . Coronary artery disease Brother   . Other Brother        colon surgery  . Heart attack Unknown        Nephew  . Irregular heart beat Daughter   . Cancer Maternal Grandmother        unknown      Prior to Admission medications   Medication Sig Start Date End Date Taking? Authorizing Provider  furosemide (LASIX) 20 MG tablet Take 1 tablet (20 mg total) by mouth daily as needed for edema. 07/17/18  Yes [name], MD  insulin NPH Human (HUMULIN N,NOVOLIN N) 100 UNIT/ML injection Inject 0.72 mLs (72 Units total) into the skin 2 (two) times daily. And syringes 1/day 07/18/18  Yes [name], MD  levothyroxine (SYNTHROID, LEVOTHROID) 100 MCG tablet Take 1 tablet (100 mcg total) by mouth daily. 10/30/18  Yes [name], MD  nitroGLYCERIN (NITROSTAT) 0.4 MG SL tablet PLACE 1 TABLET (0.4 MG TOTAL) UNDER THE TONGUE EVERY 5 (FIVE) MINUTES AS NEEDED FOR CHEST PAIN. 06/08/18  Yes [name], MD  pantoprazole (PROTONIX) 40 MG tablet TAKE 1 TABLET (40 MG TOTAL) BY MOUTH DAILY. 10/20/18  Yes [name], MD  sertraline (ZOLOFT) 100 MG tablet TAKE 1 TABLET (100 MG TOTAL) BY MOUTH 2 (TWO) TIMES DAILY. 02/02/18  Yes [name], MD    warfarin (COUMADIN) 5 MG tablet TAKE AS DIRECTED BY COUMADIN CLINIC Patient taking differently: Take 5 mg by mouth at bedtime.  06/23/18  Yes [name], MD  atorvastatin (LIPITOR) 40 MG tablet TAKE 1 & 1/2 TABLETS (60 MG TOTAL) BY MOUTH DAILY. Patient not taking: No sig reported 05/02/18   [name], MD  diclofenac sodium (VOLTAREN) 1 % GEL Apply 2 g topically daily as needed (for pain in affected area). Patient not taking: Reported on 11/06/2018 05/29/18   [name], MD  divalproex (DEPAKOTE ER) 250 MG 24 hr tablet Take 3 tablets (750 mg total) by mouth at bedtime. Patient not taking: Reported on 11/06/2018 04/20/18   Pieter Partridge, DO  glucose blood (ACCU-CHEK AVIVA) test strip Use as directed to check blood sugar 4 times daily.  DX E11.9 10/10/17   [name], MD  insulin aspart (NOVOLOG FLEXPEN) 100 UNIT/ML FlexPen 3 times a day (just before each meal) 65-45-65, and pen needles 3/day Patient not taking: Reported on 11/06/2018 03/09/18   Renato Shin, MD  isosorbide mononitrate (IMDUR) 60 MG 24 hr tablet TAKE TWO TABLETS BY MOUTH DAILY Patient not taking: Reported on 11/06/2018 09/28/18   [name], MD  loperamide (IMODIUM A-D) 2 MG tablet Take 1 tablet (2 mg total) by mouth 4 (four) times daily as needed for diarrhea or loose stools. Patient not taking: Reported on 11/06/2018 02/13/17   Fredia Sorrow, MD  metoprolol succinate (TOPROL-XL) 50 MG 24 hr tablet TAKE 1&1/2 TABLETS BY MOUTH ONCE A DAY FOR A TOTAL OF 75 MG. Patient not taking: No sig reported 03/29/18   Lyda Jester M, PA-C  midodrine (PROAMATINE) 10 MG tablet TAKE 1 TABLET (10 MG TOTAL) BY MOUTH 3 TIMES DAILY. Patient not taking: No sig reported 06/27/18   [name], MD  ondansetron (ZOFRAN ODT) 4 MG disintegrating tablet Take 1 tablet (4  mg total) by mouth every 8 (eight) hours as needed for nausea or vomiting. Patient not taking: Reported on 11/06/2018 02/13/17   Fredia Sorrow, MD   pramipexole (MIRAPEX) 1 MG tablet TAKE 1/2 TO 1 TABLET BY MOUTH AT BEDTIME Patient not taking: Reported on 11/06/2018 09/28/18   [name], MD  ranolazine (RANEXA) 500 MG 12 hr tablet Take 1 tablet (500 mg total) by mouth 2 (two) times daily. Patient not taking: Reported on 11/06/2018 01/20/18   Isaiah Serge, NP  Vitamin D, Ergocalciferol, (DRISDOL) 50000 units CAPS capsule TAKE 1 CAPSULE (50,000 UNITS TOTAL) BY MOUTH EVERY 7 (SEVEN) DAYS. Patient not taking: Reported on 11/06/2018 08/31/18   [name], MD    Physical Exam: Vitals:   11/06/18 0247 11/06/18 0300 11/06/18 0330 11/06/18 0609  BP: 140/72 138/71 (!) 157/80 (!) 141/67  Pulse: 82 81 81 67  Resp: 13 20 18 16   Temp: 98.4 F (36.9 C)     TempSrc: Oral     SpO2: 97% 95% 99% 98%  Weight: 83.9 kg     Height: 5\' 9"  (1.753 m)      General: Not in acute distress HEENT:       Eyes: PERRL, EOMI, no scleral icterus.       ENT: No discharge from the ears and nose, no pharynx injection, no tonsillar enlargement.        Neck: No JVD, no bruit, no mass felt. Heme: No neck lymph node enlargement. Cardiac: S1/S2, RRR, No murmurs, No gallops or rubs. Respiratory: No rales, wheezing, rhonchi or rubs. GI: Soft, nondistended, nontender, no rebound pain, no organomegaly, BS present. GU: No hematuria Ext: No pitting leg edema bilaterally. 2+DP/PT pulse bilaterally. Musculoskeletal: No joint deformities, No joint redness or warmth, no limitation of ROM in spin. Skin: No rashes.  Neuro: Alert, oriented X3, cranial nerves II-XII grossly intact, moves all extremities normally.   Psych: Patient is not psychotic, no suicidal or hemocidal ideation.  Labs on Admission: I have personally reviewed following labs and imaging studies  CBC: Recent Labs  Lab 11/06/18 0333  WBC 10.5  NEUTROABS 7.9*  HGB 14.0  HCT 40.9  MCV 85.7  PLT 220   Basic Metabolic Panel: Recent Labs  Lab 11/06/18 0333  NA 135  K 3.7  CL 101  CO2 21*   GLUCOSE 219*  BUN 12  CREATININE 1.27*  CALCIUM 9.4   GFR: Estimated Creatinine Clearance: 54.1 mL/min (A) (by C-G formula based on SCr of 1.27 mg/dL (H)). Liver Function Tests: Recent Labs  Lab 11/06/18 0333  AST 27  ALT 24  ALKPHOS 85  BILITOT 0.9  PROT 7.1  ALBUMIN 4.1   No results for input(s): LIPASE, AMYLASE in the last 168 hours. No results for input(s): AMMONIA in the last 168 hours. Coagulation Profile: Recent Labs  Lab 11/06/18 0333  INR 2.77   Cardiac Enzymes: No results for input(s): CKTOTAL, CKMB, CKMBINDEX, TROPONINI in the last 168 hours. BNP (last 3 results) Recent Labs    07/05/18 1155  PROBNP 74   HbA1C: No results for input(s): HGBA1C in the last 72 hours. CBG: No results for input(s): GLUCAP in the last 168 hours. Lipid Profile: No results for input(s): CHOL, HDL, LDLCALC, TRIG, CHOLHDL, LDLDIRECT in the last 72 hours. Thyroid Function Tests: No results for input(s): TSH, T4TOTAL, FREET4, T3FREE, THYROIDAB in the last 72 hours. Anemia Panel: No results for input(s): VITAMINB12, FOLATE, FERRITIN, TIBC, IRON, RETICCTPCT in the last 72 hours.  Urine analysis:    Component Value Date/Time   COLORURINE YELLOW 10/26/2018 0911   APPEARANCEUR CLEAR 10/26/2018 0911   LABSPEC 1.015 10/26/2018 0911   PHURINE 5.5 10/26/2018 0911   GLUCOSEU >=1000 (A) 10/26/2018 0911   HGBUR NEGATIVE 10/26/2018 0911   HGBUR negative 12/10/2010 1354   BILIRUBINUR NEGATIVE 10/26/2018 0911   BILIRUBINUR neg 03/06/2013 1637   KETONESUR NEGATIVE 10/26/2018 0911   PROTEINUR NEGATIVE 01/02/2017 0407   UROBILINOGEN 0.2 10/26/2018 0911   NITRITE NEGATIVE 10/26/2018 0911   LEUKOCYTESUR NEGATIVE 10/26/2018 0911   Sepsis Labs: @LABRCNTIP (procalcitonin:4,lacticidven:4) )No results found for this or any previous visit (from the past 240 hour(s)).   Radiological Exams on Admission: Dg Chest 2 View  Result Date: 11/06/2018 CLINICAL DATA:  72 year old male with chest pain  and shortness of breath. EXAM: CHEST - 2 VIEW COMPARISON:  Chest radiograph dated 12/11/2017 FINDINGS: The lungs are clear. There is no pleural effusion or pneumothorax. Stable top-normal cardiac silhouette. Median sternotomy wires and CABG vascular clips. Partially visualized cervical fusion hardware. No acute osseous pathology. IMPRESSION: No active cardiopulmonary disease. Electronically Signed   By: Anner Crete [name]   On: 11/06/2018 03:14   Ct Head Wo Contrast  Result Date: 11/06/2018 CLINICAL DATA:  72 year old male with seizures and fall. EXAM: CT HEAD WITHOUT CONTRAST CT CERVICAL SPINE WITHOUT CONTRAST TECHNIQUE: Multidetector CT imaging of the head and cervical spine was performed following the standard protocol without intravenous contrast. Multiplanar CT image reconstructions of the cervical spine were also generated. COMPARISON:  Head CT dated 07/31/2018 FINDINGS: CT HEAD FINDINGS Brain: There is mild age-related atrophy and chronic microvascular ischemic changes. There is no acute intracranial hemorrhage. No mass effect midline shift. No extra-axial fluid collection. Vascular: No hyperdense vessel or unexpected calcification. Skull: Normal. Negative for fracture or focal lesion. Sinuses/Orbits: No acute finding. Other: None CT CERVICAL SPINE FINDINGS Alignment: No acute subluxation. Normal cervical lordosis. Skull base and vertebrae: No acute fracture. Osteopenia. Soft tissues and spinal canal: No prevertebral fluid or swelling. No visible canal hematoma. Disc levels: Multilevel degenerative changes. C5-C7 disc spacer and anterior fusion hardware. Multilevel facet hypertrophy. Upper chest: Negative. Other: None IMPRESSION: 1. No acute intracranial hemorrhage. 2. No acute/traumatic cervical spine pathology. Electronically Signed   By: Anner Crete [name]   On: 11/06/2018 04:19   Ct Cervical Spine Wo Contrast  Result Date: 11/06/2018 CLINICAL DATA:  72 year old male with seizures and fall.  EXAM: CT HEAD WITHOUT CONTRAST CT CERVICAL SPINE WITHOUT CONTRAST TECHNIQUE: Multidetector CT imaging of the head and cervical spine was performed following the standard protocol without intravenous contrast. Multiplanar CT image reconstructions of the cervical spine were also generated. COMPARISON:  Head CT dated 07/31/2018 FINDINGS: CT HEAD FINDINGS Brain: There is mild age-related atrophy and chronic microvascular ischemic changes. There is no acute intracranial hemorrhage. No mass effect midline shift. No extra-axial fluid collection. Vascular: No hyperdense vessel or unexpected calcification. Skull: Normal. Negative for fracture or focal lesion. Sinuses/Orbits: No acute finding. Other: None CT CERVICAL SPINE FINDINGS Alignment: No acute subluxation. Normal cervical lordosis. Skull base and vertebrae: No acute fracture. Osteopenia. Soft tissues and spinal canal: No prevertebral fluid or swelling. No visible canal hematoma. Disc levels: Multilevel degenerative changes. C5-C7 disc spacer and anterior fusion hardware. Multilevel facet hypertrophy. Upper chest: Negative. Other: None IMPRESSION: 1. No acute intracranial hemorrhage. 2. No acute/traumatic cervical spine pathology. Electronically Signed   By: Anner Crete [name]   On: 11/06/2018 04:19     EKG: Independently reviewed.  Sinus rhythm, QTC 492, right bundle blockade, early R wave progression, nonspecific T wave change.  Assessment/Plan Principal Problem:   Seizure disorder (HCC) Active Problems:   RLS (restless legs syndrome)   OSA (obstructive sleep apnea)   Essential hypertension   Hx of CABG   Chest pain   HLD (hyperlipidemia)   Chronic diastolic CHF (congestive heart failure) ([place])   Atrial fibrillation ([place]) [I48.91]   Long term (current) use of anticoagulants [Z79.01]   Hypothyroidism due to acquired atrophy of thyroid   CAD (coronary artery disease)   Fall at home, initial encounter   CKD (chronic kidney disease), stage III  ([place])   Seizure disorder: Patient had one episode of seizure due to medication noncompliance.  Not taking Depakote for more than 3 weeks due to financial difficulties.  Patient was given 750 mg of Depakote by IV ED.  Pending Depakote level.  -Place on telemetry bed for observation -Seizure precaution -When necessary Ativan for seizure -Resume Home medications: Depakote 750 mg daily (patient has not taken his medication at bedtime, I will give him 1 dose in morning) -Consult case manager for medication needs  Fall: due to seizure. No focal neurologic findings on physical examination. -Resume seizure medications  Chest pain and hx of CAD: s/p of CBAG.  Patient has intermittent chest pain recently, most likely due to medication noncompliance.  Patient is not taking Ranexa, metoprolol, Lipitor and Imdur.  Troponin negative.  Currently patient does not have any chest pain or shortness of breath. -Resume these medications -PRN nitroglycerin -f/u trop x3  RLS (restless legs syndrome): -Pramipexole  HTN:  -Continue home medications: Metoprolol -Patient is on as needed Lasix for CHF -IV hydralazine prn  HLD (hyperlipidemia): -resume lipitor  Chronic diastolic CHF (congestive heart failure) ([place]): 2D echo 07/12/2018 showed EF of 55 to 60%.  Patient does not have leg edema JVD.  No pulmonary edema on chest x-ray.  Currently no shortness of breath.  CHF is compensated. -Continue home low-dose Lasix as needed, 20 mg prn -Check a BMP  Atrial Fibrillation: CHA2DS2-VASc Score is 6, needs oral anticoagulation. Patient is on Coumadin at home. INR is 2.77 on admission. Heart rate is well controlled. -Continue metoprolol -Continue Coumadin pharm  Hypothyroidism due to acquired atrophy of thyroid: -Continue Synthroid  CKD (chronic kidney disease), stage III ([place]): Stable.  Baseline creatinine 1.2-1.5.  His creatinine is 1.27, BUN 12. -Follow-up with a BMP  OSA: -CPAP    DVT ppx: on  coumadin Code Status: Full code Family Communication: None at bed side.    Disposition Plan:  Anticipate discharge back to previous home environment Consults called:  none Admission status: Obs / tele         Date of Service 11/06/2018    Ivor Costa [facility] Pager [phone]  If 7PM-7AM, please contact night-coverage [email or web address] Password [facility] 11/06/2018, 6:16 AM

## 2018-11-06 NOTE — ED Notes (Signed)
Patient transported to CT 

## 2018-11-06 NOTE — ED Provider Notes (Signed)
TIME SEEN: 2:56 AM  CHIEF COMPLAINT: Seizure  HPI: Patient is a 72 year old [name] with history of seizures for the past 2 years who was supposed to be on Depakote extended release 750 mg at bedtime, history of CABG, A. fib on Coumadin who presents to the emergency department after he had a seizure at home.  States over the past 2 weeks he has had increasing weakness, increasing shortness of breath with exertion and intermittent chest tightness with exertion.  No chest tightness before his seizure tonight.  States he did feel nauseous and vomited after his seizure.  States that he does not remember the seizure but states he was getting up from the bathroom when it happened.  He is complaining of neck pain at this time.  No other injury.  States he has not been taking his Depakote because he cannot afford it.  He does not remember when his last seizure was.  Last cardiac catheterization was January 2019.  Patient is on Ranexa and Imdur.  Cath 12/15/17:   Ost 1st Diag to 1st Diag lesion is 95% stenosed.  Prox LAD to Mid LAD lesion is 90% stenosed.  Ost 2nd Diag to 2nd Diag lesion is 100% stenosed.  Prox Cx lesion is 20% stenosed.  Mid Cx lesion is 30% stenosed.  Mid RCA to Dist RCA lesion is 5% stenosed.  LIMA and is normal in caliber.  And is normal in caliber.  The flow in the graft is reversed.  There is competitive flow.  And is normal in caliber.  And is normal in caliber.  Post Atrio lesion is 100% stenosed.  Mid LAD lesion is 100% stenosed.  The left ventricular systolic function is normal.  LV end diastolic pressure is normal.  The left ventricular ejection fraction is 55-65% by visual estimate.  There is no aortic valve stenosis.   Severe 2 vessel CAD.  All 4 bypass grafts are patent.   Continue medical therapy.   ROS: See HPI Constitutional: no fever  Eyes: no drainage  ENT: no runny nose   Cardiovascular:   chest pain  Resp:  SOB  GI: no vomiting GU: no  dysuria Integumentary: no rash  Allergy: no hives  Musculoskeletal: no leg swelling  Neurological: no slurred speech ROS otherwise negative  PAST MEDICAL HISTORY/PAST SURGICAL HISTORY:  Past Medical History:  Diagnosis Date  . ACP (advance care planning) 05/11/2015  . Acquired atrophy of thyroid 06/05/2013   Overview:  July 2014: controlled on synthroid 50mcg since 2012 May 2015: decreased to Synthroid 50 mcg  Last Assessment & Plan:  Pt doing well with most recent TSH within normal range.  Will continue current dosage of Synthroid 108mcg. Pt reminded to take medication on empty stomach. Will continue to monitor with periodic laboratory assessment in 3 months.   . Advance care planning 05/01/2015  . Anemia    hemoglobin 7.4, iron deficiency, January, 2011, 2 unit transfusion, endoscopy normal, capsule endoscopy February, 2011 no small bowel abnormalities.   Most likely source gastric erosions, followed by GI  . Anxiety   . Arteriosclerosis of coronary artery 05/01/2013   Overview:  July 0109: Bystolic, Simvastatin, Imdur, ASA, and NTG CABG x 4 in 2000, stent placed 02/06/1998, 11/26/2008 Sep 3235: Changed Bystolic 10 to atenolol 25mg  (for cost), Continue Simvastatin, Imdur, ASA, and NTG  Last Assessment & Plan:  Change bystolic to atenolol (for cost) and continue others as pt doing well.   . Atrial fibrillation ([place]) [I48.91] 07/20/2016  .  CAD (coronary artery disease)    A. CABG in 2000,status post cardiac cath in 2006, 2009 ....continued chest pain and SOB despite oral medication adjestments including Ranexa. B. Cath November 2009/ mRCA - 2.75 x 23 Abbott Xience V drug-eluting stent ...11/26/2008 to distal  RCA leading to acute marginal.  C. Cath 07/2012 for CP - stable anatomy, med rx. d. cath 2015 and 05/30/2015 stable anatomy, consider Myoview if has CP again  . Carotid artery disease ([place]) 08/01/2015   Doppler, May 29, 2015, 1-39% bilateral ICA   . Cerebral ischemia    MRI November, 2010, chronic  microvascular ischemia  . Chronic diastolic CHF (congestive heart failure) ([place]) 03/13/2016  . Concussion   . Coronary artery disease involving native coronary artery    status post coronary artery bypass graft in 2000,status post cardiac cath in 2006,  2009 ....continued chest pain and SOB despite oral medication adjestments including Ranexa.   Catheter November 2009/ DES  Stent - RCA...--> 11/26/2008 to distal  RCA leading to acute marginal.   Cath 01/26/2010 continued patentcy grafts and stent to RCA , Modest progression of distal disease...medical Rx/  /  Hospi  . Depression    Bipolar  . Diabetes mellitus type II, uncontrolled ([place])   . Diabetes mellitus without complication ([place])   . Diarrhea 02/15/2016  . Dizziness   . DM (diabetes mellitus), type 2 ([place]) 05/01/2013   Overview:  July 2014: metformin, Lantus & Novolog. Aug 2014: 40u Lantus nightly, 20-25u of Novolog prior to meals with metformin. Added Oseni 25mg /15mg  (worked but cost prohibitive) Jun 2015: 40u Lantus nightly, 10-15u of Novolog prior to meals with metformin. September 2015: Samples of Oseni 15/25 given for 28 days, referral to endocrinology Nov 2015: Samples of Toujeo give (4.59ml)  Last Assessme  . Edema   . Ejection fraction    EF 60%, echo, July 31, 2012  . Essential hypertension 06/05/2013   Overview:  July 2014: Controlled with Bystolic Sep 2774: Changed to atenolol.  Last Assessment & Plan:  Will change to atenolol for cost and follow Improved.  Medication compliance strongly encouraged BP: 116/64 mmHg    Overview:  July 2014: Controlled with Bystolic Sep 1287: Changed to atenolol.  Last Assessment & Plan:  Will change to atenolol for cost and follow  . Exertional dyspnea 05/14/2016  . Falling episodes    these have occurred in the past and again recurring 2011  . Family history of adverse reaction to anesthesia    "mother died during bypass surgery but not sure if it has to do with anesthesia"  . Gastric ulcer   . GERD  (gastroesophageal reflux disease)   . H/O medication noncompliance    Due to loss of insurance  . Hard of hearing   . Headache 01/25/2016  . History of blood transfusion 12/20/2013  . Hx of CABG    2000,  / one median sternotomy suture broken her chest x-ray November, 2010, no clinical significance  . Hyperlipidemia   . Hypertension    pt. denies  . Iron deficiency anemia   . Long term (current) use of anticoagulants [Z79.01] 07/20/2016  . Low back pain 06/12/2009   Qualifier: Diagnosis of  By: [name]   . Nausea with vomiting 01/25/2016  . Nephrolithiasis   . OSA (obstructive sleep apnea)   . Pain in limb 06/12/2009   Qualifier: Diagnosis of  By: [name]   . Palpitations    event recorder showed  sinus rhythm  . RBBB 07/09/2009   Qualifier: Diagnosis of  By: [name], MD, [name] [name]   . RBBB (right bundle branch block)   . Restless leg   . Right knee pain 01/07/2015  . RLS (restless legs syndrome) 09/19/2009   Qualifier: Diagnosis of  By: [name]   Overview:  July 2014: Controlled with Mirapex  Last Assessment & Plan:  Patient is doing well. Will continue current management and follow clinically.  . Seizure disorder ([place]) 01/09/2017  . Shortness of breath    CPX April, 2011, mild functional limitation, no clear pulmonary or cardiac limitation, possible deconditioning and mild chronotropic incompetence( peak heart rate 130)  . Spondylosis    C5-6, C6-7 MRI 2010  . Syncope 03/2016  . Thyroid disease   . Tubulovillous adenoma of colon 2007  . Vitamin D deficiency 05/17/2017  . Wears glasses     MEDICATIONS:  Prior to Admission medications   Medication Sig Start Date End Date Taking? Authorizing Provider  amiodarone (PACERONE) 400 MG tablet Take 200 mg by mouth daily.    [provider]  atorvastatin (LIPITOR) 40 MG tablet TAKE 1 & 1/2 TABLETS (60 MG TOTAL) BY MOUTH DAILY. Patient taking differently: Take 60 mg by mouth daily at 6 PM.  05/02/18    [name], MD  diclofenac sodium (VOLTAREN) 1 % GEL Apply 2 g topically daily as needed (for pain in affected area). 05/29/18   [name], MD  divalproex (DEPAKOTE ER) 250 MG 24 hr tablet Take 3 tablets (750 mg total) by mouth at bedtime. 04/20/18   Pieter Partridge, DO  furosemide (LASIX) 20 MG tablet Take 1 tablet (20 mg total) by mouth daily as needed for edema. 07/17/18   [name], MD  glucose blood (ACCU-CHEK AVIVA) test strip Use as directed to check blood sugar 4 times daily.  DX E11.9 10/10/17   [name], MD  insulin aspart (NOVOLOG FLEXPEN) 100 UNIT/ML FlexPen 3 times a day (just before each meal) 65-45-65, and pen needles 3/day Patient taking differently: Inject 45-65 Units into the skin See admin instructions. Use 65 units every morning, use 45 units at lunch and use 65 units at dinner 03/09/18   Renato Shin, MD  insulin NPH Human (HUMULIN N,NOVOLIN N) 100 UNIT/ML injection Inject 0.72 mLs (72 Units total) into the skin 2 (two) times daily. And syringes 1/day 07/18/18   [name], MD  isosorbide mononitrate (IMDUR) 60 MG 24 hr tablet TAKE TWO TABLETS BY MOUTH DAILY 09/28/18   [name], MD  levothyroxine (SYNTHROID, LEVOTHROID) 100 MCG tablet Take 1 tablet (100 mcg total) by mouth daily. 10/30/18   [name], MD  loperamide (IMODIUM A-D) 2 MG tablet Take 1 tablet (2 mg total) by mouth 4 (four) times daily as needed for diarrhea or loose stools. 02/13/17   Fredia Sorrow, MD  metoprolol succinate (TOPROL-XL) 50 MG 24 hr tablet TAKE 1&1/2 TABLETS BY MOUTH ONCE A DAY FOR A TOTAL OF 75 MG. Patient taking differently: Take 75 mg by mouth daily.  03/29/18   Lyda Jester M, PA-C  midodrine (PROAMATINE) 10 MG tablet TAKE 1 TABLET (10 MG TOTAL) BY MOUTH 3 TIMES DAILY. Patient taking differently: Take 10 mg by mouth 3 (three) times daily.  06/27/18   [name], MD  nitroGLYCERIN (NITROSTAT) 0.4 MG SL tablet PLACE 1 TABLET (0.4 MG TOTAL) UNDER THE  TONGUE EVERY 5 (FIVE) MINUTES AS  NEEDED FOR CHEST PAIN. 06/08/18   [name], MD  ondansetron (ZOFRAN ODT) 4 MG disintegrating tablet Take 1 tablet (4 mg total) by mouth every 8 (eight) hours as needed for nausea or vomiting. 02/13/17   Fredia Sorrow, MD  pantoprazole (PROTONIX) 40 MG tablet TAKE 1 TABLET (40 MG TOTAL) BY MOUTH DAILY. 10/20/18   [name], MD  pramipexole (MIRAPEX) 1 MG tablet TAKE 1/2 TO 1 TABLET BY MOUTH AT BEDTIME 09/28/18   [name], MD  ranolazine (RANEXA) 500 MG 12 hr tablet Take 1 tablet (500 mg total) by mouth 2 (two) times daily. 01/20/18   Isaiah Serge, NP  sertraline (ZOLOFT) 100 MG tablet TAKE 1 TABLET (100 MG TOTAL) BY MOUTH 2 (TWO) TIMES DAILY. 02/02/18   [name], MD  Vitamin D, Ergocalciferol, (DRISDOL) 50000 units CAPS capsule TAKE 1 CAPSULE (50,000 UNITS TOTAL) BY MOUTH EVERY 7 (SEVEN) DAYS. 08/31/18   [name], MD  warfarin (COUMADIN) 5 MG tablet TAKE AS DIRECTED BY COUMADIN CLINIC Patient taking differently: Take 5 mg by mouth at bedtime.  06/23/18   [name], MD    ALLERGIES:  Allergies  Allergen Reactions  . Gabapentin Rash  . Morphine Other (See Comments)    hallucinations  . Shellfish-Derived Products Itching    SOCIAL HISTORY:  Social History   Tobacco Use  . Smoking status: Never Smoker  . Smokeless tobacco: Never Used  Substance Use Topics  . Alcohol use: No    Alcohol/week: 0.0 standard drinks    Comment: stopped drinking in 1998    FAMILY HISTORY: Family History  Problem Relation Age of Onset  . Pancreatic cancer Brother   . Diabetes Brother   . Coronary artery disease Brother   . Stroke Brother   . Diabetes Brother   . Diabetes Mother   . Heart failure Mother   . Heart failure Father   . Hypothyroidism Brother   . Coronary artery disease Brother   . Other Brother        colon surgery  . Heart attack Unknown        Nephew  . Irregular heart beat Daughter   . Cancer Maternal  Grandmother        unknown     EXAM: BP 140/72 (BP Location: Right Arm)   Pulse 82   Temp 98.4 F (36.9 C) (Oral)   Resp 13   Ht 5\' 9"  (1.753 m)   Wt 83.9 kg   SpO2 97%   BMI 27.32 kg/m  CONSTITUTIONAL: Alert and oriented and responds appropriately to questions. Well-appearing; well-nourished; GCS 55, elderly HEAD: Normocephalic; atraumatic EYES: Conjunctivae clear, PERRL, EOMI ENT: normal nose; no rhinorrhea; moist mucous membranes; pharynx without lesions noted; no dental injury; no septal hematoma NECK: Supple, no meningismus, no LAD; patient does have some midline cervical spine tenderness without step-off or deformity, cervical collar in place, trachea midline CARD: RRR; S1 and S2 appreciated; no murmurs, no clicks, no rubs, no gallops RESP: Normal chest excursion without splinting or tachypnea; breath sounds clear and equal bilaterally; no wheezes, no rhonchi, no rales; no hypoxia or respiratory distress CHEST:  chest wall stable, no crepitus or ecchymosis or deformity, nontender to palpation; no flail chest ABD/GI: Normal bowel sounds; non-distended; soft, non-tender, no rebound, no guarding; no ecchymosis or other lesions noted PELVIS:  stable, nontender to palpation BACK:  The back appears normal and is non-tender to palpation, there is no CVA tenderness; no midline  spinal tenderness, step-off or deformity EXT: Normal ROM in all joints; non-tender to palpation; no edema; normal capillary refill; no cyanosis, no bony tenderness or bony deformity of patient's extremities, no joint effusion, compartments are soft, extremities are warm and well-perfused, no ecchymosis SKIN: Normal color for age and race; warm NEURO: Moves all extremities equally, no drift, sensation to light touch intact diffusely, cranial nerves II through XII intact, normal speech PSYCH: The patient's mood and manner are appropriate. Grooming and personal hygiene are appropriate.  MEDICAL DECISION MAKING:  Patient here with seizure.  Likely from noncompliance with his Depakote.  Also complaining of symptoms concerning for unstable angina with worsening shortness of breath, weakness, chest pain with exertion.  Last cardiac catheterization in January 2019 showed severe two-vessel CAD.  Will obtain cardiac labs, chest x-ray.  Given he did strike his head and is having neck pain on Coumadin will obtain CT of his head and cervical spine.  Will give Tylenol for pain.  No other injury on exam.  Will check labs, urine.  Anticipate admission.  ED PROGRESS: CT scan showed no acute abnormality.  Cervical collar removed.  Chest x-ray clear.  Labs unremarkable.  Troponin negative.  No further seizure-like activity.  Discussed with pharmacy who recommends loading patient with 750 mg of IV Depakote now.  Is 15 mg/kg per IV daily dose would be 1258.5 mg.  He can receive the rest of this within the next 12 hours.  Will discuss with medicine for admission given his concerning symptoms of unstable angina in the setting of cardiac disease.  PCP is Dr. Randel Pigg.   5:20 AM Discussed patient's case with hospitalist, Dr. Blaine Hamper.  I have recommended admission and patient (and family if present) agree with this plan. Admitting physician will place admission orders.   I reviewed all nursing notes, vitals, pertinent previous records, EKGs, lab and urine results, imaging (as available).     EKG Interpretation  Date/Time:  Monday November 06 2018 03:46:51 EST Ventricular Rate:  72 PR Interval:    QRS Duration: 138 QT Interval:  449 QTC Calculation: 492 R Axis:   9 Text Interpretation:  Sinus rhythm Right bundle branch block No significant change since last tracing Confirmed by , Cyril Mourning [phone]) on 11/06/2018 4:05:30 AM         , Delice Bison, DO 11/06/18 [id number]

## 2018-11-06 NOTE — Progress Notes (Signed)
RT placed pt on CPAP for the night on auto titrate (14 high-7 low). Pt tolerating well. RT will continue to monitor.

## 2018-11-06 NOTE — ED Notes (Signed)
Delay in lab draw,  Pt not in room at this time. 

## 2018-11-06 NOTE — ED Triage Notes (Signed)
BIB EMS from home, pt had witnessed seizure causing him to fall back and hit his head. Pt has hx of seizures but doesn't take meds d/t financial reasons. Pt takes Coumadin. Pt is A/OX4.

## 2018-11-06 NOTE — Plan of Care (Signed)
Patient stable, discussed POC with patient and spouse, agreeable with plan, denies question/concerns at this time.  

## 2018-11-06 NOTE — Progress Notes (Signed)
ANTICOAGULATION CONSULT NOTE - Initial Consult  Pharmacy Consult for coumadin Indication: atrial fibrillation  Allergies  Allergen Reactions  . Gabapentin Rash  . Morphine Other (See Comments)    hallucinations  . Shellfish-Derived Products Itching    Patient Measurements: Height: 5\' 9"  (175.3 cm) Weight: 185 lb (83.9 kg) IBW/kg (Calculated) : 70.7  Vital Signs: Temp: 98.4 F (36.9 C) (12/02 0247) Temp Source: Oral (12/02 0247) BP: 157/80 (12/02 0330) Pulse Rate: 81 (12/02 0330)  Labs: Recent Labs    11/06/18 0333  HGB 14.0  HCT 40.9  PLT 165  LABPROT 28.9*  INR 2.77  CREATININE 1.27*    Estimated Creatinine Clearance: 54.1 mL/min (A) (by C-G formula based on SCr of 1.27 mg/dL (H)).   Medical History: Past Medical History:  Diagnosis Date  . ACP (advance care planning) 05/11/2015  . Acquired atrophy of thyroid 06/05/2013   Overview:  July 2014: controlled on synthroid 58mcg since 2012 May 2015: decreased to Synthroid 50 mcg  Last Assessment & Plan:  Pt doing well with most recent TSH within normal range.  Will continue current dosage of Synthroid 20mcg. Pt reminded to take medication on empty stomach. Will continue to monitor with periodic laboratory assessment in 3 months.   . Advance care planning 05/01/2015  . Anemia    hemoglobin 7.4, iron deficiency, January, 2011, 2 unit transfusion, endoscopy normal, capsule endoscopy February, 2011 no small bowel abnormalities.   Most likely source gastric erosions, followed by GI  . Anxiety   . Arteriosclerosis of coronary artery 05/01/2013   Overview:  July 9892: Bystolic, Simvastatin, Imdur, ASA, and NTG CABG x 4 in 2000, stent placed 02/06/1998, 11/26/2008 Sep 1194: Changed Bystolic 10 to atenolol 25mg  (for cost), Continue Simvastatin, Imdur, ASA, and NTG  Last Assessment & Plan:  Change bystolic to atenolol (for cost) and continue others as pt doing well.   . Atrial fibrillation ([place]) [I48.91] 07/20/2016  . CAD (coronary  artery disease)    A. CABG in 2000,status post cardiac cath in 2006, 2009 ....continued chest pain and SOB despite oral medication adjestments including Ranexa. B. Cath November 2009/ mRCA - 2.75 x 23 Abbott Xience V drug-eluting stent ...11/26/2008 to distal  RCA leading to acute marginal.  C. Cath 07/2012 for CP - stable anatomy, med rx. d. cath 2015 and 05/30/2015 stable anatomy, consider Myoview if has CP again  . Carotid artery disease ([place]) 08/01/2015   Doppler, May 29, 2015, 1-39% bilateral ICA   . Cerebral ischemia    MRI November, 2010, chronic microvascular ischemia  . Chronic diastolic CHF (congestive heart failure) ([place]) 03/13/2016  . Concussion   . Coronary artery disease involving native coronary artery    status post coronary artery bypass graft in 2000,status post cardiac cath in 2006,  2009 ....continued chest pain and SOB despite oral medication adjestments including Ranexa.   Catheter November 2009/ DES  Stent - RCA...--> 11/26/2008 to distal  RCA leading to acute marginal.   Cath 01/26/2010 continued patentcy grafts and stent to RCA , Modest progression of distal disease...medical Rx/  /  Hospi  . Depression    Bipolar  . Diabetes mellitus type II, uncontrolled ([place])   . Diabetes mellitus without complication ([place])   . Diarrhea 02/15/2016  . Dizziness   . DM (diabetes mellitus), type 2 ([place]) 05/01/2013   Overview:  July 2014: metformin, Lantus & Novolog. Aug 2014: 40u Lantus nightly, 20-25u of Novolog prior to meals with metformin. Added Oseni 25mg /15mg  (worked but  cost prohibitive) Jun 2015: 40u Lantus nightly, 10-15u of Novolog prior to meals with metformin. September 2015: Samples of Oseni 15/25 given for 28 days, referral to endocrinology Nov 2015: Samples of Toujeo give (4.63ml)  Last Assessme  . Edema   . Ejection fraction    EF 60%, echo, July 31, 2012  . Essential hypertension 06/05/2013   Overview:  July 2014: Controlled with Bystolic Sep 0488: Changed to atenolol.  Last  Assessment & Plan:  Will change to atenolol for cost and follow Improved.  Medication compliance strongly encouraged BP: 116/64 mmHg    Overview:  July 2014: Controlled with Bystolic Sep 8916: Changed to atenolol.  Last Assessment & Plan:  Will change to atenolol for cost and follow  . Exertional dyspnea 05/14/2016  . Falling episodes    these have occurred in the past and again recurring 2011  . Family history of adverse reaction to anesthesia    "mother died during bypass surgery but not sure if it has to do with anesthesia"  . Gastric ulcer   . GERD (gastroesophageal reflux disease)   . H/O medication noncompliance    Due to loss of insurance  . Hard of hearing   . Headache 01/25/2016  . History of blood transfusion 12/20/2013  . Hx of CABG    2000,  / one median sternotomy suture broken her chest x-ray November, 2010, no clinical significance  . Hyperlipidemia   . Hypertension    pt. denies  . Iron deficiency anemia   . Long term (current) use of anticoagulants [Z79.01] 07/20/2016  . Low back pain 06/12/2009   Qualifier: Diagnosis of  By: [name]   . Nausea with vomiting 01/25/2016  . Nephrolithiasis   . OSA (obstructive sleep apnea)   . Pain in limb 06/12/2009   Qualifier: Diagnosis of  By: [name]   . Palpitations    event recorder showed sinus rhythm  . RBBB 07/09/2009   Qualifier: Diagnosis of  By: [name], MD, [name] [name]   . RBBB (right bundle branch block)   . Restless leg   . Right knee pain 01/07/2015  . RLS (restless legs syndrome) 09/19/2009   Qualifier: Diagnosis of  By: [name]   Overview:  July 2014: Controlled with Mirapex  Last Assessment & Plan:  Patient is doing well. Will continue current management and follow clinically.  . Seizure disorder ([place]) 01/09/2017  . Shortness of breath    CPX April, 2011, mild functional limitation, no clear pulmonary or cardiac limitation, possible deconditioning and mild chronotropic incompetence( peak  heart rate 130)  . Spondylosis    C5-6, C6-7 MRI 2010  . Syncope 03/2016  . Thyroid disease   . Tubulovillous adenoma of colon 2007  . Vitamin D deficiency 05/17/2017  . Wears glasses     Medications:  See medication history  Assessment: 72 yo man to continue coumadin for afib.  INR therapeutic 2.77 Goal of Therapy:  INR 2-3 Monitor platelets by anticoagulation protocol: Yes   Plan:  Coumadin 5mg  po today Daily PT/INR Monitor for bleeding complications  [name] [name] 11/06/2018,6:04 AM

## 2018-11-06 NOTE — Progress Notes (Signed)
RT offered to place pt on CPAP and pt wants to late til later tonight. RT asked RN to call when pt is ready to go on. Pt will be on auto titrate. RT will continue to monitor.

## 2018-11-06 NOTE — Progress Notes (Signed)
PROGRESS NOTE        PATIENT DETAILS Name: [name] [name] Age: 72 y.o. Sex: male Date of Birth: 12-26-1945 Admit Date: 11/06/2018 Admitting Physician Ivor Costa, MD VZD:GLOVF, Bonnita Levan, MD  Brief Narrative: Patient is a 72 y.o. male history of CAD status post CABG in 2000, PCI to RCA in 2009-he also has a history of seizure disorder, OSA on CPAP, tonic headaches hypertension, resented with a breakthrough seizure.  He unfortunately ran out of his antiseizure medications and some antihypertensives approximately 1 month back-and due to financial issues was not able to refill them.  See below for further details  Subjective: Lying comfortably in bed.  Denies any chest pain or shortness of breath.  Does acknowledge having some exertional chest pain over the past few weeks-when he ambulates to his mailbox.  Pain subsides with rest.    Assessment/Plan: Breakthrough seizures: Secondary to noncompliance due to financial reasons.  Has been restarted on Depakote.  Will discuss with neurology over the phone later to see if any further adjustment of his antiepileptic regimen needs to be performed.  Chest pain: Suspicion for angina-as he is noncompliant to medications for financial reasons.  EKG/enzymes negative.  Metoprolol, statin, Imdur and Ranexa have been restarted-Case was discussed with patient's primary cardiologist [name] over the phone-no further recommendations at this point.  LHC on 12/15/2017 showed all patent grafts.  PAF: Sinus rhythm this morning-rate controlled with metoprolol-on Coumadin with therapeutic INR.  CAD status post CABG in 2000 and PCI to RCA 2009: See above.  Chronic diastolic heart failure: Volume status is stable and he is well compensated.  Hypertension: Continue metoprolol, Imdur-follow.  Insulin-dependent diabetes: CBG stable with NPH 50 units twice daily and SSI.  Follow and adjust accordingly.  Hypothyroidism: Continue  Synthroid  CKD stage III: Creatinine close to usual baseline.  Depression: Appears to be stable-continue with Zoloft.  OSA: CPAP  DVT Prophylaxis: Full dose anticoagulation Coumadin  Code Status: Full code   Family Communication: None at bedside  Disposition Plan: Remain inpatient-home 12/3  Antimicrobial agents: Anti-infectives (From admission, onward)   None      Procedures: None  CONSULTS:  None  Time spent: 25- minutes-Greater than 50% of this time was spent in counseling, explanation of diagnosis, planning of further management, and coordination of care.  MEDICATIONS: Scheduled Meds: . atorvastatin  60 mg Oral q1800  . divalproex  750 mg Oral QHS  . insulin aspart  0-9 Units Subcutaneous TID WC  . insulin NPH Human  50 Units Subcutaneous BID  . isosorbide mononitrate  120 mg Oral Daily  . levothyroxine  100 mcg Oral Q24H  . metoprolol succinate  75 mg Oral Daily  . pantoprazole  40 mg Oral Daily  . pramipexole  0.5-1 mg Oral QHS  . sertraline  100 mg Oral BID  . warfarin  5 mg Oral ONCE-1800  . Warfarin - Pharmacist Dosing Inpatient   Does not apply q1800   Continuous Infusions: . valproate sodium     PRN Meds:.acetaminophen **OR** acetaminophen, furosemide, hydrALAZINE, LORazepam, nitroGLYCERIN, ondansetron **OR** ondansetron (ZOFRAN) IV, senna-docusate, zolpidem   PHYSICAL EXAM: Vital signs: Vitals:   11/06/18 0330 11/06/18 0609 11/06/18 0641 11/06/18 0745  BP: (!) 157/80 (!) 141/67 (!) 142/59 (!) 155/68  Pulse: 81 67 81 70  Resp: 18 16 18 18   Temp:   Marland Kitchen)  97.5 F (36.4 C) 98.4 F (36.9 C)  TempSrc:   Oral Oral  SpO2: 99% 98% 97% 98%  Weight:   86.3 kg   Height:   5\' 9"  (1.753 m)    Filed Weights   11/06/18 0247 11/06/18 0641  Weight: 83.9 kg 86.3 kg   Body mass index is 28.1 kg/m.   General appearance :Awake, alert, not in any distress. Speech Clear. Not toxic Looking Eyes:, pupils equally reactive to light and accomodation,no  scleral icterus.Pink conjunctiva HEENT: Atraumatic and Normocephalic Neck: supple, no JVD. No cervical lymphadenopathy. No thyromegaly Resp:Good air entry bilaterally, no added sounds  CVS: S1 S2 regular, no murmurs.  GI: Bowel sounds present, Non tender and not distended with no gaurding, rigidity or rebound.No organomegaly Extremities: B/L Lower Ext shows no edema, both legs are warm to touch Neurology:  speech clear,Non focal, sensation is grossly intact. Psychiatric: Normal judgment and insight. Alert and oriented x 3. Normal mood. Musculoskeletal:No digital cyanosis Skin:No Rash, warm and dry Wounds:N/A  I have personally reviewed following labs and imaging studies  LABORATORY DATA: CBC: Recent Labs  Lab 11/06/18 0333  WBC 10.5  NEUTROABS 7.9*  HGB 14.0  HCT 40.9  MCV 85.7  PLT 177    Basic Metabolic Panel: Recent Labs  Lab 11/06/18 0333  NA 135  K 3.7  CL 101  CO2 21*  GLUCOSE 219*  BUN 12  CREATININE 1.27*  CALCIUM 9.4    GFR: Estimated Creatinine Clearance: 58.9 mL/min (A) (by C-G formula based on SCr of 1.27 mg/dL (H)).  Liver Function Tests: Recent Labs  Lab 11/06/18 0333  AST 27  ALT 24  ALKPHOS 85  BILITOT 0.9  PROT 7.1  ALBUMIN 4.1   No results for input(s): LIPASE, AMYLASE in the last 168 hours. No results for input(s): AMMONIA in the last 168 hours.  Coagulation Profile: Recent Labs  Lab 11/06/18 0333  INR 2.77    Cardiac Enzymes: Recent Labs  Lab 11/06/18 0611  TROPONINI <0.03    BNP (last 3 results) Recent Labs    07/05/18 1155  PROBNP 74    HbA1C: No results for input(s): HGBA1C in the last 72 hours.  CBG: Recent Labs  Lab 11/06/18 0827  GLUCAP 293*    Lipid Profile: No results for input(s): CHOL, HDL, LDLCALC, TRIG, CHOLHDL, LDLDIRECT in the last 72 hours.  Thyroid Function Tests: No results for input(s): TSH, T4TOTAL, FREET4, T3FREE, THYROIDAB in the last 72 hours.  Anemia Panel: No results for  input(s): VITAMINB12, FOLATE, FERRITIN, TIBC, IRON, RETICCTPCT in the last 72 hours.  Urine analysis:    Component Value Date/Time   COLORURINE YELLOW 10/26/2018 0911   APPEARANCEUR CLEAR 10/26/2018 0911   LABSPEC 1.015 10/26/2018 0911   PHURINE 5.5 10/26/2018 0911   GLUCOSEU >=1000 (A) 10/26/2018 0911   HGBUR NEGATIVE 10/26/2018 0911   HGBUR negative 12/10/2010 1354   BILIRUBINUR NEGATIVE 10/26/2018 0911   BILIRUBINUR neg 03/06/2013 1637   KETONESUR NEGATIVE 10/26/2018 0911   PROTEINUR NEGATIVE 01/02/2017 0407   UROBILINOGEN 0.2 10/26/2018 0911   NITRITE NEGATIVE 10/26/2018 0911   LEUKOCYTESUR NEGATIVE 10/26/2018 0911    Sepsis Labs: Lactic Acid, Venous    Component Value Date/Time   LATICACIDVEN 2.09 (HH) 03/13/2016 1204    MICROBIOLOGY: No results found for this or any previous visit (from the past 240 hour(s)).  RADIOLOGY STUDIES/RESULTS: Dg Chest 2 View  Result Date: 11/06/2018 CLINICAL DATA:  72 year old male with chest pain and shortness of breath.  EXAM: CHEST - 2 VIEW COMPARISON:  Chest radiograph dated 12/11/2017 FINDINGS: The lungs are clear. There is no pleural effusion or pneumothorax. Stable top-normal cardiac silhouette. Median sternotomy wires and CABG vascular clips. Partially visualized cervical fusion hardware. No acute osseous pathology. IMPRESSION: No active cardiopulmonary disease. Electronically Signed   By: Anner Crete [name]   On: 11/06/2018 03:14   Ct Head Wo Contrast  Result Date: 11/06/2018 CLINICAL DATA:  72 year old male with seizures and fall. EXAM: CT HEAD WITHOUT CONTRAST CT CERVICAL SPINE WITHOUT CONTRAST TECHNIQUE: Multidetector CT imaging of the head and cervical spine was performed following the standard protocol without intravenous contrast. Multiplanar CT image reconstructions of the cervical spine were also generated. COMPARISON:  Head CT dated 07/31/2018 FINDINGS: CT HEAD FINDINGS Brain: There is mild age-related atrophy and chronic  microvascular ischemic changes. There is no acute intracranial hemorrhage. No mass effect midline shift. No extra-axial fluid collection. Vascular: No hyperdense vessel or unexpected calcification. Skull: Normal. Negative for fracture or focal lesion. Sinuses/Orbits: No acute finding. Other: None CT CERVICAL SPINE FINDINGS Alignment: No acute subluxation. Normal cervical lordosis. Skull base and vertebrae: No acute fracture. Osteopenia. Soft tissues and spinal canal: No prevertebral fluid or swelling. No visible canal hematoma. Disc levels: Multilevel degenerative changes. C5-C7 disc spacer and anterior fusion hardware. Multilevel facet hypertrophy. Upper chest: Negative. Other: None IMPRESSION: 1. No acute intracranial hemorrhage. 2. No acute/traumatic cervical spine pathology. Electronically Signed   By: Anner Crete [name]   On: 11/06/2018 04:19   Ct Cervical Spine Wo Contrast  Result Date: 11/06/2018 CLINICAL DATA:  72 year old male with seizures and fall. EXAM: CT HEAD WITHOUT CONTRAST CT CERVICAL SPINE WITHOUT CONTRAST TECHNIQUE: Multidetector CT imaging of the head and cervical spine was performed following the standard protocol without intravenous contrast. Multiplanar CT image reconstructions of the cervical spine were also generated. COMPARISON:  Head CT dated 07/31/2018 FINDINGS: CT HEAD FINDINGS Brain: There is mild age-related atrophy and chronic microvascular ischemic changes. There is no acute intracranial hemorrhage. No mass effect midline shift. No extra-axial fluid collection. Vascular: No hyperdense vessel or unexpected calcification. Skull: Normal. Negative for fracture or focal lesion. Sinuses/Orbits: No acute finding. Other: None CT CERVICAL SPINE FINDINGS Alignment: No acute subluxation. Normal cervical lordosis. Skull base and vertebrae: No acute fracture. Osteopenia. Soft tissues and spinal canal: No prevertebral fluid or swelling. No visible canal hematoma. Disc levels: Multilevel  degenerative changes. C5-C7 disc spacer and anterior fusion hardware. Multilevel facet hypertrophy. Upper chest: Negative. Other: None IMPRESSION: 1. No acute intracranial hemorrhage. 2. No acute/traumatic cervical spine pathology. Electronically Signed   By: Anner Crete [name]   On: 11/06/2018 04:19     LOS: 0 days   Oren Binet, MD  [facility]  If 7PM-7AM, please contact night-coverage  Please page via [email or web address]-Password [email or web address]-click on MD name and type text message  11/06/2018, 10:51 AM

## 2018-11-07 DIAGNOSIS — R079 Chest pain, unspecified: Secondary | ICD-10-CM | POA: Diagnosis not present

## 2018-11-07 DIAGNOSIS — I5032 Chronic diastolic (congestive) heart failure: Secondary | ICD-10-CM | POA: Diagnosis not present

## 2018-11-07 DIAGNOSIS — I10 Essential (primary) hypertension: Secondary | ICD-10-CM | POA: Diagnosis not present

## 2018-11-07 DIAGNOSIS — I480 Paroxysmal atrial fibrillation: Secondary | ICD-10-CM | POA: Diagnosis not present

## 2018-11-07 DIAGNOSIS — G40909 Epilepsy, unspecified, not intractable, without status epilepticus: Secondary | ICD-10-CM | POA: Diagnosis not present

## 2018-11-07 LAB — GLUCOSE, CAPILLARY: Glucose-Capillary: 296 mg/dL — ABNORMAL HIGH (ref 70–99)

## 2018-11-07 LAB — CBC
HEMATOCRIT: 36.8 % — AB (ref 39.0–52.0)
Hemoglobin: 12.8 g/dL — ABNORMAL LOW (ref 13.0–17.0)
MCH: 29.6 pg (ref 26.0–34.0)
MCHC: 34.8 g/dL (ref 30.0–36.0)
MCV: 85 fL (ref 80.0–100.0)
Platelets: 169 10*3/uL (ref 150–400)
RBC: 4.33 MIL/uL (ref 4.22–5.81)
RDW: 14.1 % (ref 11.5–15.5)
WBC: 7.4 10*3/uL (ref 4.0–10.5)
nRBC: 0 % (ref 0.0–0.2)

## 2018-11-07 LAB — BASIC METABOLIC PANEL
Anion gap: 11 (ref 5–15)
BUN: 16 mg/dL (ref 8–23)
CO2: 24 mmol/L (ref 22–32)
Calcium: 8.9 mg/dL (ref 8.9–10.3)
Chloride: 100 mmol/L (ref 98–111)
Creatinine, Ser: 1.34 mg/dL — ABNORMAL HIGH (ref 0.61–1.24)
GFR calc Af Amer: >60 mL/min (ref >60)
GFR calc non Af Amer: 53 mL/min — ABNORMAL LOW (ref >60)
Glucose, Bld: 186 mg/dL — ABNORMAL HIGH (ref 70–99)
Potassium: 3.6 mmol/L (ref 3.5–5.1)
Sodium: 135 mmol/L (ref 135–145)

## 2018-11-07 LAB — TSH: TSH: 0.846 u[IU]/mL (ref 0.350–4.500)

## 2018-11-07 LAB — PROTIME-INR
INR: 2.99
Prothrombin Time: 30.6 seconds — ABNORMAL HIGH (ref 11.4–15.2)

## 2018-11-07 MED ORDER — INSULIN NPH (HUMAN) (ISOPHANE) 100 UNIT/ML ~~LOC~~ SUSP: 72.0000 [IU] | Freq: Two times a day (BID) | SUBCUTANEOUS | 0 refills | Status: DC

## 2018-11-07 MED ORDER — VITAMIN D (ERGOCALCIFEROL) 1.25 MG (50000 UNIT) PO CAPS: 50000.0000 [IU] | ORAL_CAPSULE | ORAL | 0 refills | Status: DC

## 2018-11-07 MED ORDER — NITROGLYCERIN 0.4 MG SL SUBL: 0.4000 mg | SUBLINGUAL_TABLET | SUBLINGUAL | 1 refills | Status: DC | PRN

## 2018-11-07 MED ORDER — RANOLAZINE ER 500 MG PO TB12: 500.0000 mg | ORAL_TABLET | Freq: Two times a day (BID) | ORAL | 0 refills | Status: DC

## 2018-11-07 MED ORDER — DICLOFENAC SODIUM 1 % TD GEL: 2.0000 g | Freq: Every day | TRANSDERMAL | 0 refills | Status: DC | PRN

## 2018-11-07 MED ORDER — WARFARIN SODIUM 5 MG PO TABS: 5.0000 mg | ORAL_TABLET | Freq: Every day | ORAL | 0 refills | Status: DC

## 2018-11-07 MED ORDER — SERTRALINE HCL 100 MG PO TABS: 100.0000 mg | ORAL_TABLET | Freq: Two times a day (BID) | ORAL | 0 refills | Status: DC

## 2018-11-07 MED ORDER — PRAMIPEXOLE DIHYDROCHLORIDE 1 MG PO TABS: 0.5000 mg | ORAL_TABLET | Freq: Every day | ORAL | 0 refills | Status: DC

## 2018-11-07 MED ORDER — PANTOPRAZOLE SODIUM 40 MG PO TBEC: 40.0000 mg | DELAYED_RELEASE_TABLET | Freq: Every day | ORAL | 0 refills | Status: DC

## 2018-11-07 MED ORDER — GLUCOSE BLOOD VI STRP: ORAL_STRIP | 6 refills | Status: DC

## 2018-11-07 MED ORDER — ATORVASTATIN CALCIUM 40 MG PO TABS: ORAL_TABLET | ORAL | 0 refills | Status: DC

## 2018-11-07 MED ORDER — FUROSEMIDE 20 MG PO TABS: 20.0000 mg | ORAL_TABLET | Freq: Every day | ORAL | 0 refills | Status: DC | PRN

## 2018-11-07 MED ORDER — DIVALPROEX SODIUM ER 250 MG PO TB24: 750.0000 mg | ORAL_TABLET | Freq: Every day | ORAL | 0 refills | Status: DC

## 2018-11-07 MED ORDER — ISOSORBIDE MONONITRATE ER 60 MG PO TB24: 120.0000 mg | ORAL_TABLET | Freq: Every day | ORAL | 0 refills | Status: DC

## 2018-11-07 MED ORDER — LEVOTHYROXINE SODIUM 100 MCG PO TABS: 100.0000 ug | ORAL_TABLET | Freq: Every day | ORAL | 1 refills | Status: DC

## 2018-11-07 MED ORDER — INSULIN ASPART 100 UNIT/ML FLEXPEN: PEN_INJECTOR | SUBCUTANEOUS | 0 refills | Status: DC

## 2018-11-07 MED ORDER — WARFARIN SODIUM 4 MG PO TABS: 4.0000 mg | ORAL_TABLET | Freq: Once | ORAL | Status: DC

## 2018-11-07 MED ORDER — METOPROLOL SUCCINATE ER 50 MG PO TB24: ORAL_TABLET | ORAL | 0 refills | Status: DC

## 2018-11-07 MED FILL — FUROSEMIDE 20 MG TAB: 20 | 30 days supply | Qty: 30 | Fill #0

## 2018-11-07 MED FILL — WARFARIN SODIUM 5 MG TABLET: 5 | 30 days supply | Qty: 45 | Fill #3

## 2018-11-07 MED FILL — NovoLIN N 100 UNIT/ML SUSP: 100 | 90 days supply | Qty: 130 | Fill #0

## 2018-11-07 MED FILL — NOVOLOG FLEXPEN SYRINGE: 100 | 35 days supply | Qty: 60 | Fill #0

## 2018-11-07 NOTE — Care Management Note (Signed)
Case Management Note  Patient Details  Name: [name] [name] MRN: [id number] Date of Birth: 1946-05-09  Subjective/Objective:                    Action/Plan: Pt discharging home with self care. CM provided patient with information on medication assistance yesterday.  Daughter to provide transport home.  I have discussed the patient's current level of function related to seizure with the patient and wife.  They acknowledge understanding of this and feel they can provide the level of care the patient will need at home.      Expected Discharge Date:  11/07/18               Expected Discharge Plan:  Home/Self Care  In-House Referral:     Discharge planning Services  CM Consult(med assistance)  Post Acute Care Choice:    Choice offered to:     DME Arranged:    DME Agency:     HH Arranged:    HH Agency:     Status of Service:  Completed, signed off  If discussed at [employer] of Stay Meetings, dates discussed:    Additional Comments:  Pollie Friar, RN 11/07/2018, 10:07 AM

## 2018-11-07 NOTE — Progress Notes (Signed)
ANTICOAGULATION CONSULT NOTE - Initial Consult  Pharmacy Consult for coumadin Indication: atrial fibrillation  Allergies  Allergen Reactions  . Gabapentin Rash  . Morphine Other (See Comments)    hallucinations  . Shellfish-Derived Products Itching    Patient Measurements: Height: 5\' 9"  (175.3 cm) Weight: 190 lb 4.1 oz (86.3 kg) IBW/kg (Calculated) : 70.7  Vital Signs: Temp: 97.9 F (36.6 C) (12/03 0353) Temp Source: Oral (12/03 0353) BP: 109/56 (12/03 0353) Pulse Rate: 55 (12/03 0353)  Labs: Recent Labs    11/06/18 0333 11/06/18 0611 11/06/18 1205 11/06/18 1753 11/07/18 0334  HGB 14.0  --   --   --  12.8*  HCT 40.9  --   --   --  36.8*  PLT 165  --   --   --  169  LABPROT 28.9*  --   --   --  30.6*  INR 2.77  --   --   --  2.99  CREATININE 1.27*  --   --   --  1.34*  TROPONINI  --  <0.03 <0.03 <0.03  --     Estimated Creatinine Clearance: 55.8 mL/min (A) (by C-G formula based on SCr of 1.34 mg/dL (H)).   Medical History: Past Medical History:  Diagnosis Date  . ACP (advance care planning) 05/11/2015  . Acquired atrophy of thyroid 06/05/2013   Overview:  July 2014: controlled on synthroid 59mcg since 2012 May 2015: decreased to Synthroid 50 mcg  Last Assessment & Plan:  Pt doing well with most recent TSH within normal range.  Will continue current dosage of Synthroid 5mcg. Pt reminded to take medication on empty stomach. Will continue to monitor with periodic laboratory assessment in 3 months.   . Advance care planning 05/01/2015  . Anemia    hemoglobin 7.4, iron deficiency, January, 2011, 2 unit transfusion, endoscopy normal, capsule endoscopy February, 2011 no small bowel abnormalities.   Most likely source gastric erosions, followed by GI  . Anxiety   . Arteriosclerosis of coronary artery 05/01/2013   Overview:  July 2585: Bystolic, Simvastatin, Imdur, ASA, and NTG CABG x 4 in 2000, stent placed 02/06/1998, 11/26/2008 Sep 2778: Changed Bystolic 10 to atenolol 25mg   (for cost), Continue Simvastatin, Imdur, ASA, and NTG  Last Assessment & Plan:  Change bystolic to atenolol (for cost) and continue others as pt doing well.   . Atrial fibrillation ([place]) [I48.91] 07/20/2016  . CAD (coronary artery disease)    A. CABG in 2000,status post cardiac cath in 2006, 2009 ....continued chest pain and SOB despite oral medication adjestments including Ranexa. B. Cath November 2009/ mRCA - 2.75 x 23 Abbott Xience V drug-eluting stent ...11/26/2008 to distal  RCA leading to acute marginal.  C. Cath 07/2012 for CP - stable anatomy, med rx. d. cath 2015 and 05/30/2015 stable anatomy, consider Myoview if has CP again  . Carotid artery disease ([place]) 08/01/2015   Doppler, May 29, 2015, 1-39% bilateral ICA   . Cerebral ischemia    MRI November, 2010, chronic microvascular ischemia  . Chronic diastolic CHF (congestive heart failure) ([place]) 03/13/2016  . Concussion   . Coronary artery disease involving native coronary artery    status post coronary artery bypass graft in 2000,status post cardiac cath in 2006,  2009 ....continued chest pain and SOB despite oral medication adjestments including Ranexa.   Catheter November 2009/ DES  Stent - RCA...--> 11/26/2008 to distal  RCA leading to acute marginal.   Cath 01/26/2010 continued patentcy grafts and stent to  RCA , Modest progression of distal disease...medical Rx/  /  [facility]  . Depression    Bipolar  . Diabetes mellitus type II, uncontrolled ([place])   . Diabetes mellitus without complication ([place])   . Diarrhea 02/15/2016  . Dizziness   . DM (diabetes mellitus), type 2 ([place]) 05/01/2013   Overview:  July 2014: metformin, Lantus & Novolog. Aug 2014: 40u Lantus nightly, 20-25u of Novolog prior to meals with metformin. Added Oseni 25mg /15mg  (worked but cost prohibitive) Jun 2015: 40u Lantus nightly, 10-15u of Novolog prior to meals with metformin. September 2015: Samples of Oseni 15/25 given for 28 days, referral to endocrinology Nov 2015: Samples of  Toujeo give (4.51ml)  Last Assessme  . Edema   . Ejection fraction    EF 60%, echo, July 31, 2012  . Essential hypertension 06/05/2013   Overview:  July 2014: Controlled with Bystolic Sep 4174: Changed to atenolol.  Last Assessment & Plan:  Will change to atenolol for cost and follow Improved.  Medication compliance strongly encouraged BP: 116/64 mmHg    Overview:  July 2014: Controlled with Bystolic Sep 0814: Changed to atenolol.  Last Assessment & Plan:  Will change to atenolol for cost and follow  . Exertional dyspnea 05/14/2016  . Falling episodes    these have occurred in the past and again recurring 2011  . Family history of adverse reaction to anesthesia    "mother died during bypass surgery but not sure if it has to do with anesthesia"  . Gastric ulcer   . GERD (gastroesophageal reflux disease)   . H/O medication noncompliance    Due to loss of insurance  . Hard of hearing   . Headache 01/25/2016  . History of blood transfusion 12/20/2013  . Hx of CABG    2000,  / one median sternotomy suture broken her chest x-ray November, 2010, no clinical significance  . Hyperlipidemia   . Hypertension    pt. denies  . Iron deficiency anemia   . Long term (current) use of anticoagulants [Z79.01] 07/20/2016  . Low back pain 06/12/2009   Qualifier: Diagnosis of  By: [name]   . Nausea with vomiting 01/25/2016  . Nephrolithiasis   . OSA (obstructive sleep apnea)   . Pain in limb 06/12/2009   Qualifier: Diagnosis of  By: [name]   . Palpitations    event recorder showed sinus rhythm  . RBBB 07/09/2009   Qualifier: Diagnosis of  By: [name], MD, [name] [name]   . RBBB (right bundle branch block)   . Restless leg   . Right knee pain 01/07/2015  . RLS (restless legs syndrome) 09/19/2009   Qualifier: Diagnosis of  By: [name]   Overview:  July 2014: Controlled with Mirapex  Last Assessment & Plan:  Patient is doing well. Will continue current management and follow  clinically.  . Seizure disorder ([place]) 01/09/2017  . Shortness of breath    CPX April, 2011, mild functional limitation, no clear pulmonary or cardiac limitation, possible deconditioning and mild chronotropic incompetence( peak heart rate 130)  . Spondylosis    C5-6, C6-7 MRI 2010  . Syncope 03/2016  . Thyroid disease   . Tubulovillous adenoma of colon 2007  . Vitamin D deficiency 05/17/2017  . Wears glasses    Assessment: 72 yo man to continue coumadin for afib.  INR therapeutic at 2.99 but at top of range. Decreased PO intake? Will give slightly smaller dose to prevent  supratherapeutic INR Goal of Therapy:  INR 2-3 Monitor platelets by anticoagulation protocol: Yes   Plan:  Coumadin 4mg  po today Daily PT/INR Monitor for bleeding complications  Kamia Insalaco A. Levada Dy, PharmD, [place] Pager: [phone] Please utilize Amion for appropriate phone number to reach the unit pharmacist ([place])   11/07/2018,7:52 AM

## 2018-11-07 NOTE — Discharge Summary (Signed)
PATIENT DETAILS Name: [name] [name] Age: 72 y.o. Sex: male Date of Birth: [birth year] MRN: [id number]. Admitting Physician: Ivor Costa, MD BPZ:WCHEN, Bonnita Levan, MD  Admit Date: 11/06/2018 Discharge date: 11/07/2018  Recommendations for Outpatient Follow-up:  1. Follow up with PCP in 1-2 weeks 2. Please obtain BMP/CBC in one week 3. Please repeat Depakote level within 5 to 7 days  Admitted From:  Home  Disposition: [place]: No  Equipment/Devices: None  Discharge Condition: Stable  CODE STATUS: FULL CODE  Diet recommendation:  Heart Healthy / Carb Modified   Brief Summary: See H&P, Labs, Consult and Test reports for all details in brief, Patient is a 72 y.o. male history of CAD status post CABG in 2000, PCI to RCA in 2009-he also has a history of seizure disorder, OSA on CPAP, tonic headaches hypertension, resented with a breakthrough seizure.  He unfortunately ran out of his antiseizure medications and some antihypertensives approximately 1 month back-and due to financial issues was not able to refill them.  See below for further details  Brief Hospital Course: Breakthrough seizures: Secondary to noncompliance due to financial reasons.  Was loaded with Depakote in the emergency room and subsequently started on his usual dosing of Depakote.  Did discuss with neurology over the [facility] with current management and recommends that we recheck Depakote level in 5 to 7 days as outpatient.  Patient needs to follow-up with his primary neurologist.  Patient counseled extensively regarding importance of medication management.  Patient is aware of driving restrictions-see below.   Chest pain: Suspicion for angina-as he is noncompliant to medications for financial reasons.  EKG/enzymes negative.  Metoprolol, statin, Imdur and Ranexa have been restarted-Case was discussed with patient's primary cardiologist [name] over the phone-no further recommendations at this  point.  LHC on 12/15/2017 showed all patent grafts.  Patient was ambulated in the hallway on the morning of discharge by this MD-patient was completely chest pain-free and claims he felt much stronger than how he came in with.  PAF: Sinus rhythm this morning-rate controlled with metoprolol-on Coumadin with therapeutic INR.  Need outpatient monitoring of INR.  CAD status post CABG in 2000 and PCI to RCA 2009: See above.  Chronic diastolic heart failure: Volume status is stable and he is well compensated.  Hypertension: Continue metoprolol, Imdur-follow.  Insulin-dependent diabetes: CBG stable with NPH 50 units twice daily and SSI.  Follow and adjust accordingly.  Hypothyroidism: Continue Synthroid  CKD stage III: Creatinine close to usual baseline.  Depression: Appears to be stable-continue with Zoloft.  OSA: CPAP  Procedures/Studies: None  Discharge Diagnoses:  Principal Problem:   Seizure disorder ([place]) Active Problems:   RLS (restless legs syndrome)   OSA (obstructive sleep apnea)   Essential hypertension   Hx of CABG   Chest pain   HLD (hyperlipidemia)   Chronic diastolic CHF (congestive heart failure) (HCC)   Atrial fibrillation ([place]) [I48.91]   Long term (current) use of anticoagulants [Z79.01]   Hypothyroidism due to acquired atrophy of thyroid   CAD (coronary artery disease)   Fall at home, initial encounter   CKD (chronic kidney disease), stage III [facility])   Discharge Instructions:  Activity:  As tolerated with Full fall precautions use walker/cane & assistance as needed  Discharge Instructions    Ambulatory referral to Neurology   Complete by:  As directed    An appointment is requested in approximately: 1 week   Diet - low sodium heart healthy  Complete by:  As directed    Discharge instructions   Complete by:  As directed     Per [facility] statutes, patients with seizures are not allowed to drive until they have been seizure-free for  six months. Use caution when using heavy equipment or power tools. Avoid working on ladders or at heights. Take showers instead of baths. Ensure the water temperature is not too high on the home water heater. Do not go swimming alone. When caring for infants or small children, sit down when holding, feeding, or changing them to minimize risk of injury to the child in the event you have a seizure.   Follow with Primary MD  [name], MD 1 week  Please have your Depakote level checked in 5 to 7 days  Please follow with your primary cardiologist in 1-2 weeks  Please follow-up with your primary neurologist in 1-2 weeks  Please get a complete blood count and chemistry panel checked by your Primary MD at your next visit, and again as instructed by your Primary MD.  Get Medicines reviewed and adjusted: Please take all your medications with you for your next visit with your Primary MD  Laboratory/radiological data: Please request your Primary MD to go over all hospital tests and procedure/radiological results at the follow up, please ask your Primary MD to get all Hospital records sent to his/her office.  In some cases, they will be blood work, cultures and biopsy results pending at the time of your discharge. Please request that your primary care [name] follows up on these results.  Also Note the following: If you experience worsening of your admission symptoms, develop shortness of breath, life threatening emergency, suicidal or homicidal thoughts you must seek medical attention immediately by calling 911 or calling your MD immediately  if symptoms less severe.  You must read complete instructions/literature along with all the possible adverse reactions/side effects for all the Medicines you take and that have been prescribed to you. Take any new Medicines after you have completely understood and accpet all the possible adverse reactions/side effects.   Do not drive when taking Pain  medications or sleeping medications (Benzodaizepines)  Do not take more than prescribed Pain, Sleep and Anxiety Medications. It is not advisable to combine anxiety,sleep and pain medications without talking with your primary care practitioner  Special Instructions: If you have smoked or chewed Tobacco  in the last 2 yrs please stop smoking, stop any regular Alcohol  and or any Recreational drug use.  Wear Seat belts while driving.  Please note: You were cared for by a hospitalist during your hospital stay. Once you are discharged, your primary care physician will handle any further medical issues. Please note that NO REFILLS for any discharge medications will be authorized once you are discharged, as it is imperative that you return to your primary care physician (or establish a relationship with a primary care physician if you do not have one) for your post hospital discharge needs so that they can reassess your need for medications and monitor your lab values.   Increase activity slowly   Complete by:  As directed      Allergies as of 11/07/2018      Reactions   Gabapentin Rash   Morphine Other (See Comments)   hallucinations   Shellfish-derived Products Itching      Medication List    STOP taking these medications   midodrine 10 MG tablet Commonly known as:  PROAMATINE  ondansetron 4 MG disintegrating tablet Commonly known as:  ZOFRAN-ODT     TAKE these medications   atorvastatin 40 MG tablet Commonly known as:  LIPITOR TAKE 1 & 1/2 TABLETS (60 MG TOTAL) BY MOUTH DAILY.   diclofenac sodium 1 % Gel Commonly known as:  VOLTAREN Apply 2 g topically daily as needed (for pain in affected area).   divalproex 250 MG 24 hr tablet Commonly known as:  DEPAKOTE ER Take 3 tablets (750 mg total) by mouth at bedtime.   furosemide 20 MG tablet Commonly known as:  LASIX Take 1 tablet (20 mg total) by mouth daily as needed for edema.   glucose blood test strip Use as directed to  check blood sugar 4 times daily.  DX E11.9   insulin aspart 100 UNIT/ML FlexPen Commonly known as:  NOVOLOG 3 times a day (just before each meal) 65-45-65, and pen needles 3/day   insulin NPH Human 100 UNIT/ML injection Commonly known as:  HUMULIN N,NOVOLIN N Inject 0.72 mLs (72 Units total) into the skin 2 (two) times daily. And syringes 1/day   isosorbide mononitrate 60 MG 24 hr tablet Commonly known as:  IMDUR Take 2 tablets (120 mg total) by mouth daily.   levothyroxine 100 MCG tablet Commonly known as:  SYNTHROID, LEVOTHROID Take 1 tablet (100 mcg total) by mouth daily.   loperamide 2 MG tablet Commonly known as:  IMODIUM A-D Take 1 tablet (2 mg total) by mouth 4 (four) times daily as needed for diarrhea or loose stools.   metoprolol succinate 50 MG 24 hr tablet Commonly known as:  TOPROL-XL TAKE 1&1/2 TABLETS BY MOUTH ONCE A DAY FOR A TOTAL OF 75 MG.   nitroGLYCERIN 0.4 MG SL tablet Commonly known as:  NITROSTAT Place 1 tablet (0.4 mg total) under the tongue every 5 (five) minutes as needed for chest pain.   pantoprazole 40 MG tablet Commonly known as:  PROTONIX Take 1 tablet (40 mg total) by mouth daily.   pramipexole 1 MG tablet Commonly known as:  MIRAPEX Take 0.5-1 tablets (0.5-1 mg total) by mouth at bedtime.   ranolazine 500 MG 12 hr tablet Commonly known as:  RANEXA Take 1 tablet (500 mg total) by mouth 2 (two) times daily.   sertraline 100 MG tablet Commonly known as:  ZOLOFT Take 1 tablet (100 mg total) by mouth 2 (two) times daily.   Vitamin D (Ergocalciferol) 1.25 MG (50000 UT) Caps capsule Commonly known as:  DRISDOL Take 1 capsule (50,000 Units total) by mouth every 7 (seven) days.   warfarin 5 MG tablet Commonly known as:  COUMADIN Take as directed. If you are unsure how to take this medication, talk to your nurse or doctor. Original instructions:  Take 1 tablet (5 mg total) by mouth at bedtime.      Follow-up Information    [name], MD. Schedule an appointment as soon as possible for a visit in 1 week(s).   Specialty:  Family Medicine Contact information: [address] [place] [address] [phone]        [name], MD. Schedule an appointment as soon as possible for a visit in 1 week(s).   Specialties:  Cardiology, Radiology, Interventional Cardiology Contact information: [address] [place] [address] [phone]        Pieter Partridge, DO. Schedule an appointment as soon as possible for a visit in 1 week(s).   Specialty:  Neurology Contact  information: [name] [place] [address] [phone]          Allergies  Allergen Reactions  . Gabapentin Rash  . Morphine Other (See Comments)    hallucinations  . Shellfish-Derived Products Itching    Consultations:   None  Other Procedures/Studies: Dg Chest 2 View  Result Date: 11/06/2018 CLINICAL DATA:  72 year old male with chest pain and shortness of breath. EXAM: CHEST - 2 VIEW COMPARISON:  Chest radiograph dated 12/11/2017 FINDINGS: The lungs are clear. There is no pleural effusion or pneumothorax. Stable top-normal cardiac silhouette. Median sternotomy wires and CABG vascular clips. Partially visualized cervical fusion hardware. No acute osseous pathology. IMPRESSION: No active cardiopulmonary disease. Electronically Signed   By: Anner Crete [name]   On: 11/06/2018 03:14   Ct Head Wo Contrast  Result Date: 11/06/2018 CLINICAL DATA:  72 year old male with seizures and fall. EXAM: CT HEAD WITHOUT CONTRAST CT CERVICAL SPINE WITHOUT CONTRAST TECHNIQUE: Multidetector CT imaging of the head and cervical spine was performed following the standard protocol without intravenous contrast. Multiplanar CT image reconstructions of the cervical spine were also generated. COMPARISON:  Head CT dated 07/31/2018 FINDINGS: CT HEAD FINDINGS Brain: There is mild age-related atrophy and  chronic microvascular ischemic changes. There is no acute intracranial hemorrhage. No mass effect midline shift. No extra-axial fluid collection. Vascular: No hyperdense vessel or unexpected calcification. Skull: Normal. Negative for fracture or focal lesion. Sinuses/Orbits: No acute finding. Other: None CT CERVICAL SPINE FINDINGS Alignment: No acute subluxation. Normal cervical lordosis. Skull base and vertebrae: No acute fracture. Osteopenia. Soft tissues and spinal canal: No prevertebral fluid or swelling. No visible canal hematoma. Disc levels: Multilevel degenerative changes. C5-C7 disc spacer and anterior fusion hardware. Multilevel facet hypertrophy. Upper chest: Negative. Other: None IMPRESSION: 1. No acute intracranial hemorrhage. 2. No acute/traumatic cervical spine pathology. Electronically Signed   By: Anner Crete [name]   On: 11/06/2018 04:19   Ct Cervical Spine Wo Contrast  Result Date: 11/06/2018 CLINICAL DATA:  72 year old male with seizures and fall. EXAM: CT HEAD WITHOUT CONTRAST CT CERVICAL SPINE WITHOUT CONTRAST TECHNIQUE: Multidetector CT imaging of the head and cervical spine was performed following the standard protocol without intravenous contrast. Multiplanar CT image reconstructions of the cervical spine were also generated. COMPARISON:  Head CT dated 07/31/2018 FINDINGS: CT HEAD FINDINGS Brain: There is mild age-related atrophy and chronic microvascular ischemic changes. There is no acute intracranial hemorrhage. No mass effect midline shift. No extra-axial fluid collection. Vascular: No hyperdense vessel or unexpected calcification. Skull: Normal. Negative for fracture or focal lesion. Sinuses/Orbits: No acute finding. Other: None CT CERVICAL SPINE FINDINGS Alignment: No acute subluxation. Normal cervical lordosis. Skull base and vertebrae: No acute fracture. Osteopenia. Soft tissues and spinal canal: No prevertebral fluid or swelling. No visible canal hematoma. Disc levels:  Multilevel degenerative changes. C5-C7 disc spacer and anterior fusion hardware. Multilevel facet hypertrophy. Upper chest: Negative. Other: None IMPRESSION: 1. No acute intracranial hemorrhage. 2. No acute/traumatic cervical spine pathology. Electronically Signed   By: Anner Crete [name]   On: 11/06/2018 04:19     TODAY-DAY OF DISCHARGE:  Subjective:   [name] [name] today has no headache,no chest abdominal pain,no new weakness tingling or numbness, feels much better wants to go home today.   Objective:   Blood pressure 133/65, pulse 62, temperature 99 F (37.2 C), temperature source Oral, resp. rate 18, height 5\' 9"  (1.753 m), weight 86.3 kg, SpO2 99 %. No intake or output data in  the 24 hours ending 11/07/18 0828 Filed Weights   11/06/18 0247 11/06/18 0641  Weight: 83.9 kg 86.3 kg    Exam: Awake Alert, Oriented *3, No new F.N deficits, Normal affect [place].AT,PERRAL Supple Neck,No JVD, No cervical lymphadenopathy appriciated.  Symmetrical Chest wall movement, Good air movement bilaterally, CTAB RRR,No Gallops,Rubs or new Murmurs, No Parasternal Heave +ve B.Sounds, Abd Soft, Non tender, No organomegaly appriciated, No rebound -guarding or rigidity. No Cyanosis, Clubbing or edema, No new Rash or bruise   PERTINENT RADIOLOGIC STUDIES: Dg Chest 2 View  Result Date: 11/06/2018 CLINICAL DATA:  72 year old male with chest pain and shortness of breath. EXAM: CHEST - 2 VIEW COMPARISON:  Chest radiograph dated 12/11/2017 FINDINGS: The lungs are clear. There is no pleural effusion or pneumothorax. Stable top-normal cardiac silhouette. Median sternotomy wires and CABG vascular clips. Partially visualized cervical fusion hardware. No acute osseous pathology. IMPRESSION: No active cardiopulmonary disease. Electronically Signed   By: Anner Crete [name]   On: 11/06/2018 03:14   Ct Head Wo Contrast  Result Date: 11/06/2018 CLINICAL DATA:  72 year old male with seizures and fall. EXAM: CT  HEAD WITHOUT CONTRAST CT CERVICAL SPINE WITHOUT CONTRAST TECHNIQUE: Multidetector CT imaging of the head and cervical spine was performed following the standard protocol without intravenous contrast. Multiplanar CT image reconstructions of the cervical spine were also generated. COMPARISON:  Head CT dated 07/31/2018 FINDINGS: CT HEAD FINDINGS Brain: There is mild age-related atrophy and chronic microvascular ischemic changes. There is no acute intracranial hemorrhage. No mass effect midline shift. No extra-axial fluid collection. Vascular: No hyperdense vessel or unexpected calcification. Skull: Normal. Negative for fracture or focal lesion. Sinuses/Orbits: No acute finding. Other: None CT CERVICAL SPINE FINDINGS Alignment: No acute subluxation. Normal cervical lordosis. Skull base and vertebrae: No acute fracture. Osteopenia. Soft tissues and spinal canal: No prevertebral fluid or swelling. No visible canal hematoma. Disc levels: Multilevel degenerative changes. C5-C7 disc spacer and anterior fusion hardware. Multilevel facet hypertrophy. Upper chest: Negative. Other: None IMPRESSION: 1. No acute intracranial hemorrhage. 2. No acute/traumatic cervical spine pathology. Electronically Signed   By: Anner Crete [name]   On: 11/06/2018 04:19   Ct Cervical Spine Wo Contrast  Result Date: 11/06/2018 CLINICAL DATA:  72 year old male with seizures and fall. EXAM: CT HEAD WITHOUT CONTRAST CT CERVICAL SPINE WITHOUT CONTRAST TECHNIQUE: Multidetector CT imaging of the head and cervical spine was performed following the standard protocol without intravenous contrast. Multiplanar CT image reconstructions of the cervical spine were also generated. COMPARISON:  Head CT dated 07/31/2018 FINDINGS: CT HEAD FINDINGS Brain: There is mild age-related atrophy and chronic microvascular ischemic changes. There is no acute intracranial hemorrhage. No mass effect midline shift. No extra-axial fluid collection. Vascular: No hyperdense  vessel or unexpected calcification. Skull: Normal. Negative for fracture or focal lesion. Sinuses/Orbits: No acute finding. Other: None CT CERVICAL SPINE FINDINGS Alignment: No acute subluxation. Normal cervical lordosis. Skull base and vertebrae: No acute fracture. Osteopenia. Soft tissues and spinal canal: No prevertebral fluid or swelling. No visible canal hematoma. Disc levels: Multilevel degenerative changes. C5-C7 disc spacer and anterior fusion hardware. Multilevel facet hypertrophy. Upper chest: Negative. Other: None IMPRESSION: 1. No acute intracranial hemorrhage. 2. No acute/traumatic cervical spine pathology. Electronically Signed   By: Anner Crete [name]   On: 11/06/2018 04:19     PERTINENT LAB RESULTS: CBC: Recent Labs    11/06/18 0333 11/07/18 0334  WBC 10.5 7.4  HGB 14.0 12.8*  HCT 40.9 36.8*  PLT 165 169   CMET  CMP     Component Value Date/Time   NA 135 11/07/2018 0334   NA 139 07/05/2018 1155   K 3.6 11/07/2018 0334   CL 100 11/07/2018 0334   CO2 24 11/07/2018 0334   GLUCOSE 186 (H) 11/07/2018 0334   BUN 16 11/07/2018 0334   BUN 13 07/05/2018 1155   CREATININE 1.34 (H) 11/07/2018 0334   CREATININE 1.08 03/03/2017 1622   CALCIUM 8.9 11/07/2018 0334   PROT 7.1 11/06/2018 0333   PROT 6.8 09/27/2017 0823   ALBUMIN 4.1 11/06/2018 0333   ALBUMIN 4.4 09/27/2017 0823   AST 27 11/06/2018 0333   ALT 24 11/06/2018 0333   ALKPHOS 85 11/06/2018 0333   BILITOT 0.9 11/06/2018 0333   BILITOT 0.6 09/27/2017 0823   GFRNONAA 53 (L) 11/07/2018 0334   GFRAA >60 11/07/2018 0334    GFR Estimated Creatinine Clearance: 55.8 mL/min (A) (by C-G formula based on SCr of 1.34 mg/dL (H)). No results for input(s): LIPASE, AMYLASE in the last 72 hours. Recent Labs    11/06/18 0611 11/06/18 1205 11/06/18 1753  TROPONINI <0.03 <0.03 <0.03   Invalid input(s): POCBNP No results for input(s): DDIMER in the last 72 hours. No results for input(s): HGBA1C in the last 72 hours. No  results for input(s): CHOL, HDL, LDLCALC, TRIG, CHOLHDL, LDLDIRECT in the last 72 hours. Recent Labs    11/07/18 0334  TSH 0.846   No results for input(s): VITAMINB12, FOLATE, FERRITIN, TIBC, IRON, RETICCTPCT in the last 72 hours. Coags: Recent Labs    11/06/18 0333 11/07/18 0334  INR 2.77 2.99   Microbiology: No results found for this or any previous visit (from the past 240 hour(s)).  FURTHER DISCHARGE INSTRUCTIONS:  Get Medicines reviewed and adjusted: Please take all your medications with you for your next visit with your Primary MD  Laboratory/radiological data: Please request your Primary MD to go over all hospital tests and procedure/radiological results at the follow up, please ask your Primary MD to get all Hospital records sent to his/her office.  In some cases, they will be blood work, cultures and biopsy results pending at the time of your discharge. Please request that your primary care [name] goes through all the records of your hospital data and follows up on these results.  Also Note the following: If you experience worsening of your admission symptoms, develop shortness of breath, life threatening emergency, suicidal or homicidal thoughts you must seek medical attention immediately by calling 911 or calling your MD immediately  if symptoms less severe.  You must read complete instructions/literature along with all the possible adverse reactions/side effects for all the Medicines you take and that have been prescribed to you. Take any new Medicines after you have completely understood and accpet all the possible adverse reactions/side effects.   Do not drive when taking Pain medications or sleeping medications (Benzodaizepines)  Do not take more than prescribed Pain, Sleep and Anxiety Medications. It is not advisable to combine anxiety,sleep and pain medications without talking with your primary care practitioner  Special Instructions: If you have smoked or chewed  Tobacco  in the last 2 yrs please stop smoking, stop any regular Alcohol  and or any Recreational drug use.  Wear Seat belts while driving.  Please note: You were cared for by a hospitalist during your hospital stay. Once you are discharged, your primary care physician will handle any further medical issues. Please note that NO REFILLS for any discharge medications will be authorized once you are  discharged, as it is imperative that you return to your primary care physician (or establish a relationship with a primary care physician if you do not have one) for your post hospital discharge needs so that they can reassess your need for medications and monitor your lab values.  Total Time spent coordinating discharge including counseling, education and face to face time equals  45 minutes.  SignedOren Binet 11/07/2018 8:28 AM

## 2018-11-07 NOTE — Care Management Obs Status (Signed)
[place] NOTIFICATION   Patient Details  Name: [name] [name] MRN: [id number] Date of Birth: [birth year]   Medicare Observation Status Notification Given:  Yes    Pollie Friar, RN 11/07/2018, 9:20 AM

## 2018-11-08 MED FILL — ISOSORBIDE MN ER 60 MG TAB: 60 | 90 days supply | Qty: 180 | Fill #0

## 2018-11-08 MED FILL — METOPROLOL SUCCINATE ER 50: 50 | 40 days supply | Qty: 60 | Fill #0

## 2018-11-08 MED FILL — RANOLAZINE ER 500 MG TABLET: 500 | 30 days supply | Qty: 60 | Fill #0

## 2018-11-08 MED FILL — DIVALPROEX SOD ER 250 MG TA: 250 | 60 days supply | Qty: 180 | Fill #0

## 2018-12-08 ENCOUNTER — Other Ambulatory Visit: Payer: Self-pay | Admitting: Family Medicine

## 2018-12-08 MED FILL — SERTRALINE HCL 100 MG TAB: 100 | 30 days supply | Qty: 60 | Fill #0

## 2018-12-08 MED FILL — FUROSEMIDE 20 MG TAB: 20 | 30 days supply | Qty: 30 | Fill #3

## 2018-12-08 MED FILL — PRAMIPEXOLE 1 MG TABLET: 1 | 30 days supply | Qty: 30 | Fill #1

## 2018-12-08 MED FILL — MIDODRINE HCL 10 MG TABLET: 10 | 30 days supply | Qty: 90 | Fill #3

## 2018-12-19 ENCOUNTER — Other Ambulatory Visit: Payer: Self-pay | Admitting: Family Medicine

## 2018-12-19 MED ORDER — RANOLAZINE ER 500 MG PO TB12: 500.0000 mg | ORAL_TABLET | Freq: Two times a day (BID) | ORAL | 0 refills | Status: DC

## 2018-12-19 MED FILL — METOPROLOL SUCCINATE ER 50: 50 | 30 days supply | Qty: 45 | Fill #5

## 2018-12-19 MED FILL — ATORVASTATIN 40 MG TABLET: 40 | 30 days supply | Qty: 45 | Fill #1

## 2018-12-19 MED FILL — WARFARIN SODIUM 5 MG TABLET: 5 | 30 days supply | Qty: 45 | Fill #4

## 2018-12-19 MED FILL — RANOLAZINE ER 500 MG TABLET: 500 | 30 days supply | Qty: 60 | Fill #0

## 2018-12-22 ENCOUNTER — Ambulatory Visit (INDEPENDENT_AMBULATORY_CARE_PROVIDER_SITE_OTHER): Payer: Medicare PPO | Admitting: Family Medicine

## 2018-12-22 ENCOUNTER — Ambulatory Visit (HOSPITAL_BASED_OUTPATIENT_CLINIC_OR_DEPARTMENT_OTHER)
Admission: RE | Admit: 2018-12-22 | Discharge: 2018-12-22 | Disposition: A | Discharge status: Home / Self Care | Payer: Medicare PPO | Source: Ambulatory Visit | Attending: Family Medicine | Admitting: Family Medicine

## 2018-12-22 VITALS — BP 122/56 | HR 64 | Temp 97.6°F | Resp 18 | Wt 198.6 lb

## 2018-12-22 DIAGNOSIS — N183 Chronic kidney disease, stage 3 unspecified: Secondary | ICD-10-CM

## 2018-12-22 DIAGNOSIS — I10 Essential (primary) hypertension: Secondary | ICD-10-CM | POA: Diagnosis not present

## 2018-12-22 DIAGNOSIS — R519 Headache, unspecified: Secondary | ICD-10-CM

## 2018-12-22 DIAGNOSIS — R51 Headache: Secondary | ICD-10-CM | POA: Diagnosis not present

## 2018-12-22 DIAGNOSIS — R569 Unspecified convulsions: Secondary | ICD-10-CM | POA: Diagnosis not present

## 2018-12-22 DIAGNOSIS — M542 Cervicalgia: Secondary | ICD-10-CM

## 2018-12-22 MED ORDER — TIZANIDINE HCL 4 MG PO TABS: 4.0000 mg | ORAL_TABLET | Freq: Four times a day (QID) | ORAL | 0 refills | Status: DC | PRN

## 2018-12-22 NOTE — Patient Instructions (Signed)

## 2018-12-22 NOTE — Progress Notes (Signed)
Subjective:    Patient ID: [name] [name], male    DOB: 09/30/1946, 73 y.o.   MRN: [id number]  No chief complaint on file.   HPI  Patient is in today for follow up on chronic conditions. Pt complains of severe "headaches" starting in neck and then radiating up towards the back of his head, favoring the right side. They happen at night when he lies straight back on his head, so he has to sleep on either side, and can get up to a pain level of 8. The pain is relieved by sitting in his chair and resting his head on his recliner with an ice pack. He here today for treatment.  Patient Care Team: [name], MD as PCP - General (Family Medicine) [name], MD as PCP - Cardiology (Cardiology) [name], MD as Consulting Physician (Cardiology) Deboraha Sprang, MD as Consulting Physician (Cardiology) [place], Mike Gip, MD as Consulting Physician (Pulmonary Disease) Warden Fillers, MD as Consulting Physician (Ophthalmology)   Past Medical History:  Diagnosis Date  . ACP (advance care planning) 05/11/2015  . Acquired atrophy of thyroid 06/05/2013   Overview:  July 2014: controlled on synthroid 32mcg since 2012 May 2015: decreased to Synthroid 50 mcg  Last Assessment & Plan:  Pt doing well with most recent TSH within normal range.  Will continue current dosage of Synthroid 56mcg. Pt reminded to take medication on empty stomach. Will continue to monitor with periodic laboratory assessment in 3 months.   . Advance care planning 05/01/2015  . Anemia    hemoglobin 7.4, iron deficiency, January, 2011, 2 unit transfusion, endoscopy normal, capsule endoscopy February, 2011 no small bowel abnormalities.   Most likely source gastric erosions, followed by GI  . Anxiety   . Arteriosclerosis of coronary artery 05/01/2013   Overview:  July 1594: Bystolic, Simvastatin, Imdur, ASA, and NTG CABG x 4 in 2000, stent placed 02/06/1998, 11/26/2008 Sep 5859: Changed Bystolic 10 to  atenolol 25mg  (for cost), Continue Simvastatin, Imdur, ASA, and NTG  Last Assessment & Plan:  Change bystolic to atenolol (for cost) and continue others as pt doing well.   . Atrial fibrillation ([place]) [I48.91] 07/20/2016  . CAD (coronary artery disease)    A. CABG in 2000,status post cardiac cath in 2006, 2009 ....continued chest pain and SOB despite oral medication adjestments including Ranexa. B. Cath November 2009/ mRCA - 2.75 x 23 Abbott Xience V drug-eluting stent ...11/26/2008 to distal  RCA leading to acute marginal.  C. Cath 07/2012 for CP - stable anatomy, med rx. d. cath 2015 and 05/30/2015 stable anatomy, consider Myoview if has CP again  . Carotid artery disease ([place]) 08/01/2015   Doppler, May 29, 2015, 1-39% bilateral ICA   . Cerebral ischemia    MRI November, 2010, chronic microvascular ischemia  . Chronic diastolic CHF (congestive heart failure) ([place]) 03/13/2016  . Concussion   . Coronary artery disease involving native coronary artery    status post coronary artery bypass graft in 2000,status post cardiac cath in 2006,  2009 ....continued chest pain and SOB despite oral medication adjestments including Ranexa.   Catheter November 2009/ DES  Stent - RCA...--> 11/26/2008 to distal  RCA leading to acute marginal.   Cath 01/26/2010 continued patentcy grafts and stent to RCA , Modest progression of distal disease...medical Rx/  /  [facility]  . Depression    Bipolar  . Diabetes mellitus type II, uncontrolled ([place])   . Diabetes mellitus without complication (  [place])   . Diarrhea 02/15/2016  . Dizziness   . DM (diabetes mellitus), type 2 ([place]) 05/01/2013   Overview:  July 2014: metformin, Lantus & Novolog. Aug 2014: 40u Lantus nightly, 20-25u of Novolog prior to meals with metformin. Added Oseni 25mg /15mg  (worked but cost prohibitive) Jun 2015: 40u Lantus nightly, 10-15u of Novolog prior to meals with metformin. September 2015: Samples of Oseni 15/25 given for 28 days, referral to endocrinology Nov 2015:  Samples of Toujeo give (4.63ml)  Last Assessme  . Edema   . Ejection fraction    EF 60%, echo, July 31, 2012  . Essential hypertension 06/05/2013   Overview:  July 2014: Controlled with Bystolic Sep 8101: Changed to atenolol.  Last Assessment & Plan:  Will change to atenolol for cost and follow Improved.  Medication compliance strongly encouraged BP: 116/64 mmHg    Overview:  July 2014: Controlled with Bystolic Sep 7510: Changed to atenolol.  Last Assessment & Plan:  Will change to atenolol for cost and follow  . Exertional dyspnea 05/14/2016  . Falling episodes    these have occurred in the past and again recurring 2011  . Family history of adverse reaction to anesthesia    "mother died during bypass surgery but not sure if it has to do with anesthesia"  . Gastric ulcer   . GERD (gastroesophageal reflux disease)   . H/O medication noncompliance    Due to loss of insurance  . Hard of hearing   . Headache 01/25/2016  . History of blood transfusion 12/20/2013  . Hx of CABG    2000,  / one median sternotomy suture broken her chest x-ray November, 2010, no clinical significance  . Hyperlipidemia   . Hypertension    pt. denies  . Iron deficiency anemia   . Long term (current) use of anticoagulants [Z79.01] 07/20/2016  . Low back pain 06/12/2009   Qualifier: Diagnosis of  By: [name]   . Nausea with vomiting 01/25/2016  . Nephrolithiasis   . OSA (obstructive sleep apnea)   . Pain in limb 06/12/2009   Qualifier: Diagnosis of  By: [name]   . Palpitations    event recorder showed sinus rhythm  . RBBB 07/09/2009   Qualifier: Diagnosis of  By: [name], MD, [name] [name]   . RBBB (right bundle branch block)   . Restless leg   . Right knee pain 01/07/2015  . RLS (restless legs syndrome) 09/19/2009   Qualifier: Diagnosis of  By: [name]   Overview:  July 2014: Controlled with Mirapex  Last Assessment & Plan:  Patient is doing well. Will continue current management and  follow clinically.  . Seizure disorder ([place]) 01/09/2017  . Shortness of breath    CPX April, 2011, mild functional limitation, no clear pulmonary or cardiac limitation, possible deconditioning and mild chronotropic incompetence( peak heart rate 130)  . Spondylosis    C5-6, C6-7 MRI 2010  . Syncope 03/2016  . Thyroid disease   . Tubulovillous adenoma of colon 2007  . Vitamin D deficiency 05/17/2017  . Wears glasses     Past Surgical History:  Procedure Laterality Date  . ANTERIOR CERVICAL DECOMP/DISCECTOMY FUSION N/A 05/31/2016   Procedure: ANTERIOR CERVICAL DECOMPRESSION/DISCECTOMY FUSION CERVICAL FIVE-SIX,CERVICAL SIX-SEVEN;  Surgeon: [name], MD;  Location: [facility];  Service: Neurosurgery;  Laterality: N/A;  . CARDIAC CATHETERIZATION N/A 05/30/2015   Procedure: Left Heart Cath and Coronary Angiography;  Surgeon: [name], MD;  Location: [facility] LAB;  Service: Cardiovascular;  Laterality: N/A;  . COLONOSCOPY    . CORONARY ARTERY BYPASS GRAFT     2000  . ESOPHAGOGASTRODUODENOSCOPY    . LEFT HEART CATH AND CORS/GRAFTS ANGIOGRAPHY N/A 12/15/2017   Procedure: LEFT HEART CATH AND CORS/GRAFTS ANGIOGRAPHY;  Surgeon: [name], MD;  Location: [place] CV LAB;  Service: Cardiovascular;  Laterality: N/A;  . LEFT HEART CATHETERIZATION WITH CORONARY/GRAFT ANGIOGRAM N/A 08/01/2012   Procedure: LEFT HEART CATHETERIZATION WITH Beatrix Fetters;  Surgeon: [name], MD;  [facility] CATH LAB;  Service: Cardiovascular;  Laterality: N/A;  . LEFT HEART CATHETERIZATION WITH CORONARY/GRAFT ANGIOGRAM N/A 01/03/2015   Procedure: LEFT HEART CATHETERIZATION WITH Beatrix Fetters;  Surgeon: [name], MD;  Location: [facility] CATH LAB;  Service: Cardiovascular;  Laterality: N/A;  . NASAL SEPTUM SURGERY     UP3  . PERCUTANEOUS CORONARY STENT INTERVENTION (PCI-S)  10/2008   mRCA PCI  2.75 x 23 Abbott Xience V drug-eluting stent   . ULTRASOUND GUIDANCE FOR  VASCULAR ACCESS  12/15/2017   Procedure: Ultrasound Guidance For Vascular Access;  Surgeon: [name], MD;  Location: [employer] [facility] CV LAB;  Service: Cardiovascular;;    Family History  Problem Relation Age of Onset  . Pancreatic cancer Brother   . Diabetes Brother   . Coronary artery disease Brother   . Stroke Brother   . Diabetes Brother   . Diabetes Mother   . Heart failure Mother   . Heart failure Father   . Hypothyroidism Brother   . Coronary artery disease Brother   . Other Brother        colon surgery  . Heart attack Unknown        Nephew  . Irregular heart beat Daughter   . Cancer Maternal Grandmother        unknown     Social History   Socioeconomic History  . Marital status: Married    Spouse name: Not on file  . Number of children: 4  . Years of education: 85  . Highest education level: Not on file  Occupational History  . Occupation: retired  Scientific laboratory technician  . Financial resource strain: Not on file  . Food insecurity:    Worry: Not on file    Inability: Not on file  . Transportation needs:    Medical: Not on file    Non-medical: Not on file  Tobacco Use  . Smoking status: Never Smoker  . Smokeless tobacco: Never Used  Substance and Sexual Activity  . Alcohol use: No    Alcohol/week: 0.0 standard drinks    Comment: stopped drinking in 1998  . Drug use: No  . Sexual activity: Not on file  Lifestyle  . Physical activity:    Days per week: Not on file    Minutes per session: Not on file  . Stress: Not on file  Relationships  . Social connections:    Talks on phone: Not on file    Gets together: Not on file    Attends religious service: Not on file    Active member of club or organization: Not on file    Attends meetings of clubs or organizations: Not on file    Relationship status: Not on file  . Intimate partner violence:    Fear of current or ex partner: Not on file    Emotionally abused: Not on file    Physically abused: Not on file  Forced sexual activity: Not on file  Other Topics Concern  . Not on file  Social History Narrative   Patient is right handed.   Patient does not drink caffeine.    Outpatient Medications Prior to Visit  Medication Sig Dispense Refill  . atorvastatin (LIPITOR) 40 MG tablet TAKE 1 & 1/2 TABLETS (60 MG TOTAL) BY MOUTH DAILY. 60 tablet 0  . diclofenac sodium (VOLTAREN) 1 % GEL Apply 2 g topically daily as needed (for pain in affected area). 2 Tube 0  . divalproex (DEPAKOTE ER) 250 MG 24 hr tablet Take 3 tablets (750 mg total) by mouth at bedtime. 180 tablet 0  . furosemide (LASIX) 20 MG tablet Take 1 tablet (20 mg total) by mouth daily as needed for edema. 30 tablet 0  . glucose blood (ACCU-CHEK AVIVA) test strip Use as directed to check blood sugar 4 times daily.  DX E11.9 200 each 6  . insulin aspart (NOVOLOG FLEXPEN) 100 UNIT/ML FlexPen 3 times a day (just before each meal) 65-45-65, and pen needles 3/day 20 pen 0  . insulin NPH Human (HUMULIN N,NOVOLIN N) 100 UNIT/ML injection Inject 0.72 mLs (72 Units total) into the skin 2 (two) times daily. And syringes 1/day 130 mL 0  . isosorbide mononitrate (IMDUR) 60 MG 24 hr tablet Take 2 tablets (120 mg total) by mouth daily. 180 tablet 0  . levothyroxine (SYNTHROID, LEVOTHROID) 100 MCG tablet Take 1 tablet (100 mcg total) by mouth daily. 90 tablet 1  . loperamide (IMODIUM A-D) 2 MG tablet Take 1 tablet (2 mg total) by mouth 4 (four) times daily as needed for diarrhea or loose stools. 30 tablet 0  . metoprolol succinate (TOPROL-XL) 50 MG 24 hr tablet TAKE 1&1/2 TABLETS BY MOUTH ONCE A DAY FOR A TOTAL OF 75 MG. 60 tablet 0  . nitroGLYCERIN (NITROSTAT) 0.4 MG SL tablet Place 1 tablet (0.4 mg total) under the tongue every 5 (five) minutes as needed for chest pain. 25 tablet 1  . pantoprazole (PROTONIX) 40 MG tablet Take 1 tablet (40 mg total) by mouth daily. 90 tablet 0  . pramipexole (MIRAPEX) 1 MG tablet Take 0.5-1 tablets (0.5-1 mg total) by mouth  at bedtime. 30 tablet 0  . ranolazine (RANEXA) 500 MG 12 hr tablet Take 1 tablet (500 mg total) by mouth 2 (two) times daily. 60 tablet 0  . sertraline (ZOLOFT) 100 MG tablet TAKE 1 TABLET (100 MG TOTAL) BY MOUTH 2 (TWO) TIMES DAILY. 180 tablet 1  . Vitamin D, Ergocalciferol, (DRISDOL) 1.25 MG (50000 UT) CAPS capsule Take 1 capsule (50,000 Units total) by mouth every 7 (seven) days. 20 capsule 0  . warfarin (COUMADIN) 5 MG tablet Take 1 tablet (5 mg total) by mouth at bedtime. 30 tablet 0   No facility-administered medications prior to visit.     Allergies  Allergen Reactions  . Gabapentin Rash  . Morphine Other (See Comments)    hallucinations  . Shellfish-Derived Products Itching    Review of Systems  Constitutional: Negative for chills, fever and malaise/fatigue.  HENT: Negative for congestion and hearing loss.   Eyes: Negative for blurred vision and double vision.  Respiratory: Negative for shortness of breath.   Cardiovascular: Negative for chest pain and palpitations.  Gastrointestinal: Negative for abdominal pain.  Musculoskeletal: Positive for neck pain.  Neurological: Positive for seizures and headaches.       Objective:     [name] [name] appears his stated age, WDWN, and in no acute distress.  Physical Exam Constitutional:      Appearance: Normal appearance. He is normal weight.  HENT:     Head: Normocephalic and atraumatic.     Nose: Nose normal.     Mouth/Throat:     Mouth: Mucous membranes are moist.     Pharynx: Oropharynx is clear.  Eyes:     Pupils: Pupils are equal, round, and reactive to light.  Neck:     Musculoskeletal: Neck supple. Muscular tenderness (tenderness on back of neck upon palpation) present.  Cardiovascular:     Rate and Rhythm: Normal rate and regular rhythm.     Heart sounds: Normal heart sounds.  Pulmonary:     Effort: Pulmonary effort is normal.     Breath sounds: Normal breath sounds.  Abdominal:     General: Bowel sounds  are normal.  Neurological:     General: No focal deficit present.     Mental Status: He is alert and oriented to person, place, and time.  Psychiatric:        Mood and Affect: Mood normal.        Behavior: Behavior normal.     BP (!) 122/56 (BP Location: Left Arm, Patient Position: Sitting, Cuff Size: Normal)   Pulse 64   Temp 97.6 F (36.4 C) (Oral)   Resp 18   Wt 90.1 kg   SpO2 98%   BMI 29.33 kg/m  Wt Readings from Last 3 Encounters:  12/22/18 90.1 kg  11/06/18 86.3 kg  10/26/18 87.6 kg   BP Readings from Last 3 Encounters:  12/22/18 (!) 122/56  11/07/18 133/65  10/26/18 124/66     Immunization History  Administered Date(s) Administered  . Influenza Split 09/21/2012  . Influenza Whole 10/16/2007, 10/01/2008, 09/23/2009, 09/22/2010  . Influenza, High Dose Seasonal PF 10/08/2013, 08/28/2014, 09/08/2017, 08/08/2018  . Influenza,inj,Quad PF,6+ Mos 08/22/2015, 08/22/2015, 08/24/2016  . Influenza-Unspecified 09/05/2012, 09/04/2014  . Pneumococcal Conjugate-13 01/23/2014, 08/24/2016  . Pneumococcal Polysaccharide-23 12/18/2008, 01/03/2015  . Td 12/18/2007  . Zoster 11/25/2010, 12/07/2011, 11/30/2012    Health Maintenance  Topic Date Due  . Hepatitis C Screening  1946/06/03  . OPHTHALMOLOGY EXAM  06/20/2012  . URINE MICROALBUMIN  04/30/2016  . TETANUS/TDAP  12/17/2017  . COLONOSCOPY  02/11/2019  . FOOT EXAM  03/10/2019  . HEMOGLOBIN A1C  04/26/2019  . INFLUENZA VACCINE  Completed  . PNA vac Low Risk Adult  Completed    Lab Results  Component Value Date   WBC 7.4 11/07/2018   HGB 12.8 (L) 11/07/2018   HCT 36.8 (L) 11/07/2018   PLT 169 11/07/2018   GLUCOSE 186 (H) 11/07/2018   CHOL 98 10/26/2018   TRIG 263.0 (H) 10/26/2018   HDL 22.60 (L) 10/26/2018   LDLDIRECT 46.0 10/26/2018   LDLCALC 32 07/17/2018   ALT 24 11/06/2018   AST 27 11/06/2018   NA 135 11/07/2018   K 3.6 11/07/2018   CL 100 11/07/2018   CREATININE 1.34 (H) 11/07/2018   BUN 16 11/07/2018     CO2 24 11/07/2018   TSH 0.846 11/07/2018   PSA 2.39 11/25/2010   INR 2.99 11/07/2018   HGBA1C 11.1 (H) 10/26/2018   MICROALBUR 1.1 05/01/2015    Lab Results  Component Value Date   TSH 0.846 11/07/2018   Lab Results  Component Value Date   WBC 7.4 11/07/2018   HGB 12.8 (L) 11/07/2018   HCT 36.8 (L) 11/07/2018   MCV 85.0 11/07/2018   PLT 169 11/07/2018   Lab Results  Component  Value Date   NA 135 11/07/2018   K 3.6 11/07/2018   CO2 24 11/07/2018   GLUCOSE 186 (H) 11/07/2018   BUN 16 11/07/2018   CREATININE 1.34 (H) 11/07/2018   BILITOT 0.9 11/06/2018   ALKPHOS 85 11/06/2018   AST 27 11/06/2018   ALT 24 11/06/2018   PROT 7.1 11/06/2018   ALBUMIN 4.1 11/06/2018   CALCIUM 8.9 11/07/2018   ANIONGAP 11 11/07/2018   GFR 53.67 (L) 10/26/2018   Lab Results  Component Value Date   CHOL 98 10/26/2018   Lab Results  Component Value Date   HDL 22.60 (L) 10/26/2018   Lab Results  Component Value Date   LDLCALC 32 07/17/2018   Lab Results  Component Value Date   TRIG 263.0 (H) 10/26/2018   Lab Results  Component Value Date   CHOLHDL 4 10/26/2018   Lab Results  Component Value Date   HGBA1C 11.1 (H) 10/26/2018        Assessment & Plan:   Problems Addressed this Visit  Neck Pain: Pain throughout the day, worse at night if pt lays straight on the back of his head so he has to sleep on his side. Head CT was done at the ER within the last week after an episode of seizure which showed no structural abnormality. This points dx towards musculoskeletal, hence the spine radiology and tizanidine. - Orders: DG Cervical Spine Complete; Tizanidine  I am having [name] [name] start on tiZANidine. I am also having him maintain his loperamide, atorvastatin, diclofenac sodium, divalproex, furosemide, glucose blood, insulin aspart, insulin NPH Human, isosorbide mononitrate, levothyroxine, metoprolol succinate, nitroGLYCERIN, pantoprazole, pramipexole, Vitamin D  (Ergocalciferol), warfarin, sertraline, and ranolazine.  Meds ordered this encounter  Medications  . tiZANidine (ZANAFLEX) 4 MG tablet    Sig: Take 1 tablet (4 mg total) by mouth every 6 (six) hours as needed for muscle spasms.    Dispense:  40 tablet    Refill:  0     [place], [place]

## 2018-12-25 NOTE — Progress Notes (Signed)
Subjective:    Patient ID: [name] [name], male    DOB: 1946/02/06, 73 y.o.   MRN: [id number]  No chief complaint on file.   HPI Patient is in today for follow up. He continues to have daily headache and he notes they usually start with tension in his neck and then spread to scalp and then generalize. No recent fall or trauma. Denies CP/palp/SOB/HA/congestion/fevers/GI or GU c/o. Taking meds as prescribed. No radicular symptoms. .  Past Medical History:  Diagnosis Date  . ACP (advance care planning) 05/11/2015  . Acquired atrophy of thyroid 06/05/2013   Overview:  July 2014: controlled on synthroid 59mcg since 2012 May 2015: decreased to Synthroid 50 mcg  Last Assessment & Plan:  Pt doing well with most recent TSH within normal range.  Will continue current dosage of Synthroid 38mcg. Pt reminded to take medication on empty stomach. Will continue to monitor with periodic laboratory assessment in 3 months.   . Advance care planning 05/01/2015  . Anemia    hemoglobin 7.4, iron deficiency, January, 2011, 2 unit transfusion, endoscopy normal, capsule endoscopy February, 2011 no small bowel abnormalities.   Most likely source gastric erosions, followed by GI  . Anxiety   . Arteriosclerosis of coronary artery 05/01/2013   Overview:  July 2409: Bystolic, Simvastatin, Imdur, ASA, and NTG CABG x 4 in 2000, stent placed 02/06/1998, 11/26/2008 Sep 7353: Changed Bystolic 10 to atenolol 25mg  (for cost), Continue Simvastatin, Imdur, ASA, and NTG  Last Assessment & Plan:  Change bystolic to atenolol (for cost) and continue others as pt doing well.   . Atrial fibrillation ([place]) [I48.91] 07/20/2016  . CAD (coronary artery disease)    A. CABG in 2000,status post cardiac cath in 2006, 2009 ....continued chest pain and SOB despite oral medication adjestments including Ranexa. B. Cath November 2009/ mRCA - 2.75 x 23 Abbott Xience V drug-eluting stent ...11/26/2008 to distal  RCA leading to acute marginal.  C. Cath  07/2012 for CP - stable anatomy, med rx. d. cath 2015 and 05/30/2015 stable anatomy, consider Myoview if has CP again  . Carotid artery disease ([place]) 08/01/2015   Doppler, May 29, 2015, 1-39% bilateral ICA   . Cerebral ischemia    MRI November, 2010, chronic microvascular ischemia  . Chronic diastolic CHF (congestive heart failure) ([place]) 03/13/2016  . Concussion   . Coronary artery disease involving native coronary artery    status post coronary artery bypass graft in 2000,status post cardiac cath in 2006,  2009 ....continued chest pain and SOB despite oral medication adjestments including Ranexa.   Catheter November 2009/ DES  Stent - RCA...--> 11/26/2008 to distal  RCA leading to acute marginal.   Cath 01/26/2010 continued patentcy grafts and stent to RCA , Modest progression of distal disease...medical Rx/  /  Hospi  . Depression    Bipolar  . Diabetes mellitus type II, uncontrolled ([place])   . Diabetes mellitus without complication ([place])   . Diarrhea 02/15/2016  . Dizziness   . DM (diabetes mellitus), type 2 ([place]) 05/01/2013   Overview:  July 2014: metformin, Lantus & Novolog. Aug 2014: 40u Lantus nightly, 20-25u of Novolog prior to meals with metformin. Added Oseni 25mg /15mg  (worked but cost prohibitive) Jun 2015: 40u Lantus nightly, 10-15u of Novolog prior to meals with metformin. September 2015: Samples of Oseni 15/25 given for 28 days, referral to endocrinology Nov 2015: Samples of Toujeo give (4.28ml)  Last Assessme  . Edema   . Ejection fraction  EF 60%, echo, July 31, 2012  . Essential hypertension 06/05/2013   Overview:  July 2014: Controlled with Bystolic Sep 0459: Changed to atenolol.  Last Assessment & Plan:  Will change to atenolol for cost and follow Improved.  Medication compliance strongly encouraged BP: 116/64 mmHg    Overview:  July 2014: Controlled with Bystolic Sep 9774: Changed to atenolol.  Last Assessment & Plan:  Will change to atenolol for cost and follow  . Exertional  dyspnea 05/14/2016  . Falling episodes    these have occurred in the past and again recurring 2011  . Family history of adverse reaction to anesthesia    "mother died during bypass surgery but not sure if it has to do with anesthesia"  . Gastric ulcer   . GERD (gastroesophageal reflux disease)   . H/O medication noncompliance    Due to loss of insurance  . Hard of hearing   . Headache 01/25/2016  . History of blood transfusion 12/20/2013  . Hx of CABG    2000,  / one median sternotomy suture broken her chest x-ray November, 2010, no clinical significance  . Hyperlipidemia   . Hypertension    pt. denies  . Iron deficiency anemia   . Long term (current) use of anticoagulants [Z79.01] 07/20/2016  . Low back pain 06/12/2009   Qualifier: Diagnosis of  By: [name]   . Nausea with vomiting 01/25/2016  . Nephrolithiasis   . OSA (obstructive sleep apnea)   . Pain in limb 06/12/2009   Qualifier: Diagnosis of  By: [name]   . Palpitations    event recorder showed sinus rhythm  . RBBB 07/09/2009   Qualifier: Diagnosis of  By: [name], MD, [name] [name]   . RBBB (right bundle branch block)   . Restless leg   . Right knee pain 01/07/2015  . RLS (restless legs syndrome) 09/19/2009   Qualifier: Diagnosis of  By: [name]   Overview:  July 2014: Controlled with Mirapex  Last Assessment & Plan:  Patient is doing well. Will continue current management and follow clinically.  . Seizure disorder ([place]) 01/09/2017  . Shortness of breath    CPX April, 2011, mild functional limitation, no clear pulmonary or cardiac limitation, possible deconditioning and mild chronotropic incompetence( peak heart rate 130)  . Spondylosis    C5-6, C6-7 MRI 2010  . Syncope 03/2016  . Thyroid disease   . Tubulovillous adenoma of colon 2007  . Vitamin D deficiency 05/17/2017  . Wears glasses     Past Surgical History:  Procedure Laterality Date  . ANTERIOR CERVICAL DECOMP/DISCECTOMY FUSION N/A  05/31/2016   Procedure: ANTERIOR CERVICAL DECOMPRESSION/DISCECTOMY FUSION CERVICAL FIVE-SIX,CERVICAL SIX-SEVEN;  Surgeon: [name], MD;  Location: [facility];  Service: Neurosurgery;  Laterality: N/A;  . CARDIAC CATHETERIZATION N/A 05/30/2015   Procedure: Left Heart Cath and Coronary Angiography;  Surgeon: [name], MD;  Location: [facility];  Service: Cardiovascular;  Laterality: N/A;  . COLONOSCOPY    . CORONARY ARTERY BYPASS GRAFT     2000  . ESOPHAGOGASTRODUODENOSCOPY    . LEFT HEART CATH AND CORS/GRAFTS ANGIOGRAPHY N/A 12/15/2017   Procedure: LEFT HEART CATH AND CORS/GRAFTS ANGIOGRAPHY;  Surgeon: [name], MD;  Location: [facility];  Service: Cardiovascular;  Laterality: N/A;  . LEFT HEART CATHETERIZATION WITH CORONARY/GRAFT ANGIOGRAM N/A 08/01/2012   Procedure: LEFT HEART CATHETERIZATION WITH Beatrix Fetters;  Surgeon: [name], MD;  Location: [facility];  Service: Cardiovascular;  Laterality: N/A;  . LEFT HEART CATHETERIZATION WITH CORONARY/GRAFT ANGIOGRAM N/A 01/03/2015   Procedure: LEFT HEART CATHETERIZATION WITH Beatrix Fetters;  Surgeon: [name], MD;  Location: [facility];  Service: Cardiovascular;  Laterality: N/A;  . NASAL SEPTUM SURGERY     UP3  . PERCUTANEOUS CORONARY STENT INTERVENTION (PCI-S)  10/2008   mRCA PCI  2.75 x 23 Abbott Xience V drug-eluting stent   . ULTRASOUND GUIDANCE FOR VASCULAR ACCESS  12/15/2017   Procedure: Ultrasound Guidance For Vascular Access;  Surgeon: [name], MD;  Location: [facility];  Service: Cardiovascular;;    Family History  Problem Relation Age of Onset  . Pancreatic cancer Brother   . Diabetes Brother   . Coronary artery disease Brother   . Stroke Brother   . Diabetes Brother   . Diabetes Mother   . Heart failure Mother   . Heart failure Father   . Hypothyroidism Brother   . Coronary artery disease Brother   . Other Brother        colon  surgery  . Heart attack Unknown        Nephew  . Irregular heart beat Daughter   . Cancer Maternal Grandmother        unknown     Social History   Socioeconomic History  . Marital status: Married    Spouse name: Not on file  . Number of children: 4  . Years of education: 31  . Highest education level: Not on file  Occupational History  . Occupation: retired  Scientific laboratory technician  . Financial resource strain: Not on file  . Food insecurity:    Worry: Not on file    Inability: Not on file  . Transportation needs:    Medical: Not on file    Non-medical: Not on file  Tobacco Use  . Smoking status: Never Smoker  . Smokeless tobacco: Never Used  Substance and Sexual Activity  . Alcohol use: No    Alcohol/week: 0.0 standard drinks    Comment: stopped drinking in 1998  . Drug use: No  . Sexual activity: Not on file  Lifestyle  . Physical activity:    Days per week: Not on file    Minutes per session: Not on file  . Stress: Not on file  Relationships  . Social connections:    Talks on phone: Not on file    Gets together: Not on file    Attends religious service: Not on file    Active member of club or organization: Not on file    Attends meetings of clubs or organizations: Not on file    Relationship status: Not on file  . Intimate partner violence:    Fear of current or ex partner: Not on file    Emotionally abused: Not on file    Physically abused: Not on file    Forced sexual activity: Not on file  Other Topics Concern  . Not on file  Social History Narrative   Patient is right handed.   Patient does not drink caffeine.    Outpatient Medications Prior to Visit  Medication Sig Dispense Refill  . atorvastatin (LIPITOR) 40 MG tablet TAKE 1 & 1/2 TABLETS (60 MG TOTAL) BY MOUTH DAILY. 60 tablet 0  . diclofenac sodium (VOLTAREN) 1 % GEL Apply 2 g topically daily as needed (for pain in affected area). 2 Tube 0  . divalproex (DEPAKOTE ER) 250  MG 24 hr tablet Take 3 tablets  (750 mg total) by mouth at bedtime. 180 tablet 0  . furosemide (LASIX) 20 MG tablet Take 1 tablet (20 mg total) by mouth daily as needed for edema. 30 tablet 0  . glucose blood (ACCU-CHEK AVIVA) test strip Use as directed to check blood sugar 4 times daily.  DX E11.9 200 each 6  . insulin aspart (NOVOLOG FLEXPEN) 100 UNIT/ML FlexPen 3 times a day (just before each meal) 65-45-65, and pen needles 3/day 20 pen 0  . insulin NPH Human (HUMULIN N,NOVOLIN N) 100 UNIT/ML injection Inject 0.72 mLs (72 Units total) into the skin 2 (two) times daily. And syringes 1/day 130 mL 0  . isosorbide mononitrate (IMDUR) 60 MG 24 hr tablet Take 2 tablets (120 mg total) by mouth daily. 180 tablet 0  . levothyroxine (SYNTHROID, LEVOTHROID) 100 MCG tablet Take 1 tablet (100 mcg total) by mouth daily. 90 tablet 1  . loperamide (IMODIUM A-D) 2 MG tablet Take 1 tablet (2 mg total) by mouth 4 (four) times daily as needed for diarrhea or loose stools. 30 tablet 0  . metoprolol succinate (TOPROL-XL) 50 MG 24 hr tablet TAKE 1&1/2 TABLETS BY MOUTH ONCE A DAY FOR A TOTAL OF 75 MG. 60 tablet 0  . nitroGLYCERIN (NITROSTAT) 0.4 MG SL tablet Place 1 tablet (0.4 mg total) under the tongue every 5 (five) minutes as needed for chest pain. 25 tablet 1  . pantoprazole (PROTONIX) 40 MG tablet Take 1 tablet (40 mg total) by mouth daily. 90 tablet 0  . pramipexole (MIRAPEX) 1 MG tablet Take 0.5-1 tablets (0.5-1 mg total) by mouth at bedtime. 30 tablet 0  . ranolazine (RANEXA) 500 MG 12 hr tablet Take 1 tablet (500 mg total) by mouth 2 (two) times daily. 60 tablet 0  . sertraline (ZOLOFT) 100 MG tablet TAKE 1 TABLET (100 MG TOTAL) BY MOUTH 2 (TWO) TIMES DAILY. 180 tablet 1  . Vitamin D, Ergocalciferol, (DRISDOL) 1.25 MG (50000 UT) CAPS capsule Take 1 capsule (50,000 Units total) by mouth every 7 (seven) days. 20 capsule 0  . warfarin (COUMADIN) 5 MG tablet Take 1 tablet (5 mg total) by mouth at bedtime. 30 tablet 0   No facility-administered  medications prior to visit.     Allergies  Allergen Reactions  . Gabapentin Rash  . Morphine Other (See Comments)    hallucinations  . Shellfish-Derived Products Itching    Review of Systems  Constitutional: Positive for malaise/fatigue. Negative for fever.  HENT: Negative for congestion.   Eyes: Negative for blurred vision.  Respiratory: Negative for shortness of breath.   Cardiovascular: Negative for chest pain, palpitations and leg swelling.  Gastrointestinal: Negative for abdominal pain, blood in stool and nausea.  Genitourinary: Negative for dysuria and frequency.  Musculoskeletal: Positive for neck pain. Negative for falls.  Skin: Negative for rash.  Neurological: Positive for dizziness and headaches. Negative for loss of consciousness.  Endo/Heme/Allergies: Negative for environmental allergies.  Psychiatric/Behavioral: Negative for depression. The patient is not nervous/anxious.        Objective:    Physical Exam Vitals signs and nursing note reviewed.  Constitutional:      General: He is not in acute distress.    Appearance: He is well-developed.  HENT:     Head: Normocephalic and atraumatic.     Nose: Nose normal.  Eyes:     General:        Right eye: No discharge.  Left eye: No discharge.  Neck:     Musculoskeletal: Normal range of motion and neck supple.  Cardiovascular:     Rate and Rhythm: Normal rate and regular rhythm.     Heart sounds: No murmur.  Pulmonary:     Effort: Pulmonary effort is normal.     Breath sounds: Normal breath sounds.  Abdominal:     General: Bowel sounds are normal.     Palpations: Abdomen is soft.     Tenderness: There is no abdominal tenderness.  Musculoskeletal:        General: Tenderness present.     Comments: Tenderness with palp over b/l SCM muscles  Skin:    General: Skin is warm and dry.  Neurological:     General: No focal deficit present.     Mental Status: He is alert and oriented to person, place, and  time. Mental status is at baseline.     Sensory: No sensory deficit.     Motor: No weakness.     Coordination: Coordination normal.     Gait: Gait normal.     BP (!) 122/56 (BP Location: Left Arm, Patient Position: Sitting, Cuff Size: Normal)   Pulse 64   Temp 97.6 F (36.4 C) (Oral)   Resp 18   Wt 198 lb 9.6 oz (90.1 kg)   SpO2 98%   BMI 29.33 kg/m  Wt Readings from Last 3 Encounters:  12/22/18 198 lb 9.6 oz (90.1 kg)  11/06/18 190 lb 4.1 oz (86.3 kg)  10/26/18 193 lb 3.2 oz (87.6 kg)     Lab Results  Component Value Date   WBC 7.4 11/07/2018   HGB 12.8 (L) 11/07/2018   HCT 36.8 (L) 11/07/2018   PLT 169 11/07/2018   GLUCOSE 186 (H) 11/07/2018   CHOL 98 10/26/2018   TRIG 263.0 (H) 10/26/2018   HDL 22.60 (L) 10/26/2018   LDLDIRECT 46.0 10/26/2018   LDLCALC 32 07/17/2018   ALT 24 11/06/2018   AST 27 11/06/2018   NA 135 11/07/2018   K 3.6 11/07/2018   CL 100 11/07/2018   CREATININE 1.34 (H) 11/07/2018   BUN 16 11/07/2018   CO2 24 11/07/2018   TSH 0.846 11/07/2018   PSA 2.39 11/25/2010   INR 2.99 11/07/2018   HGBA1C 11.1 (H) 10/26/2018   MICROALBUR 1.1 05/01/2015    Lab Results  Component Value Date   TSH 0.846 11/07/2018   Lab Results  Component Value Date   WBC 7.4 11/07/2018   HGB 12.8 (L) 11/07/2018   HCT 36.8 (L) 11/07/2018   MCV 85.0 11/07/2018   PLT 169 11/07/2018   Lab Results  Component Value Date   NA 135 11/07/2018   K 3.6 11/07/2018   CO2 24 11/07/2018   GLUCOSE 186 (H) 11/07/2018   BUN 16 11/07/2018   CREATININE 1.34 (H) 11/07/2018   BILITOT 0.9 11/06/2018   ALKPHOS 85 11/06/2018   AST 27 11/06/2018   ALT 24 11/06/2018   PROT 7.1 11/06/2018   ALBUMIN 4.1 11/06/2018   CALCIUM 8.9 11/07/2018   ANIONGAP 11 11/07/2018   GFR 53.67 (L) 10/26/2018   Lab Results  Component Value Date   CHOL 98 10/26/2018   Lab Results  Component Value Date   HDL 22.60 (L) 10/26/2018   Lab Results  Component Value Date   LDLCALC 32 07/17/2018     Lab Results  Component Value Date   TRIG 263.0 (H) 10/26/2018   Lab Results  Component Value Date  CHOLHDL 4 10/26/2018   Lab Results  Component Value Date   HGBA1C 11.1 (H) 10/26/2018       Assessment & Plan:   Problem List Items Addressed This Visit    Essential hypertension    Well controlled, no changes to meds. Encouraged heart healthy diet such as the DASH diet and exercise as tolerated.       Headache    With associated neck pain. Will proceed with imaging and given rx for Tizanidine, awaiting appointment with neurology. Encouraged moist heat and gentle stretching as tolerated. May try NSAIDs and prescription meds as directed and report if symptoms worsen or seek immediate care      Relevant Medications   tiZANidine (ZANAFLEX) 4 MG tablet   CKD (chronic kidney disease), stage III ([place])    Hydrate and monitor       Other Visit Diagnoses    Neck pain    -  Primary   Relevant Orders   DG Cervical Spine Complete (Completed)      I am having [name] [name] start on tiZANidine. I am also having him maintain his loperamide, atorvastatin, diclofenac sodium, divalproex, furosemide, glucose blood, insulin aspart, insulin NPH Human, isosorbide mononitrate, levothyroxine, metoprolol succinate, nitroGLYCERIN, pantoprazole, pramipexole, Vitamin D (Ergocalciferol), warfarin, sertraline, and ranolazine.  Meds ordered this encounter  Medications  . tiZANidine (ZANAFLEX) 4 MG tablet    Sig: Take 1 tablet (4 mg total) by mouth every 6 (six) hours as needed for muscle spasms.    Dispense:  40 tablet    Refill:  0     Penni Homans, MD

## 2018-12-25 NOTE — Assessment & Plan Note (Signed)
Hydrate and monitor 

## 2018-12-25 NOTE — Assessment & Plan Note (Signed)
With associated neck pain. Will proceed with imaging and given rx for Tizanidine, awaiting appointment with neurology. Encouraged moist heat and gentle stretching as tolerated. May try NSAIDs and prescription meds as directed and report if symptoms worsen or seek immediate care

## 2018-12-25 NOTE — Assessment & Plan Note (Signed)
Well controlled, no changes to meds. Encouraged heart healthy diet such as the DASH diet and exercise as tolerated.  °

## 2018-12-29 ENCOUNTER — Ambulatory Visit: Payer: Medicare PPO | Admitting: Family Medicine

## 2019-01-08 MED FILL — MIDODRINE HCL 10 MG TABLET: 10 | 30 days supply | Qty: 90 | Fill #4

## 2019-01-08 MED FILL — DIVALPROEX SOD ER 250 MG TA: 250 | 30 days supply | Qty: 90 | Fill #5

## 2019-01-08 MED FILL — SERTRALINE HCL 100 MG TAB: 100 | 90 days supply | Qty: 180 | Fill #0

## 2019-01-10 MED FILL — NOVOLOG FLEXPEN SYRINGE: 100 | 32 days supply | Qty: 60 | Fill #7

## 2019-01-11 ENCOUNTER — Ambulatory Visit: Payer: Medicare PPO | Admitting: Family Medicine

## 2019-01-12 ENCOUNTER — Telehealth: Payer: Self-pay | Admitting: Neurology

## 2019-01-12 ENCOUNTER — Institutional Professional Consult (permissible substitution): Payer: Medicare PPO | Admitting: Neurology

## 2019-01-12 NOTE — Telephone Encounter (Signed)
This patient did not show for a new patient appointment today. 

## 2019-01-15 ENCOUNTER — Encounter: Payer: Self-pay | Admitting: Neurology

## 2019-01-25 ENCOUNTER — Other Ambulatory Visit: Payer: Self-pay | Admitting: Family Medicine

## 2019-01-25 MED FILL — PRAMIPEXOLE 1 MG TABLET: 1 | 30 days supply | Qty: 30 | Fill #2

## 2019-01-25 MED FILL — FUROSEMIDE 20 MG TAB: 20 | 30 days supply | Qty: 30 | Fill #0

## 2019-01-29 ENCOUNTER — Other Ambulatory Visit: Payer: Self-pay | Admitting: Family Medicine

## 2019-01-29 ENCOUNTER — Encounter: Payer: Self-pay | Admitting: Physician Assistant

## 2019-01-29 ENCOUNTER — Other Ambulatory Visit: Payer: Self-pay | Admitting: Interventional Cardiology

## 2019-01-29 ENCOUNTER — Telehealth: Payer: Self-pay | Admitting: Interventional Cardiology

## 2019-01-29 MED FILL — RANOLAZINE ER 500 MG TABLET: 500 | 30 days supply | Qty: 60 | Fill #0

## 2019-01-29 MED FILL — ATORVASTATIN 40 MG TABLET: 40 | 30 days supply | Qty: 45 | Fill #2

## 2019-01-29 MED FILL — GABAPENTIN 100 MG CAPSULE: 100 | 90 days supply | Qty: 180 | Fill #1

## 2019-01-29 MED FILL — METOPROLOL SUCCINATE ER 50: 50 | 30 days supply | Qty: 45 | Fill #6

## 2019-01-29 NOTE — Telephone Encounter (Signed)
Pt overdue for follow-up, last seen November 2019.  Called pt made appt for 02/01/19, also seeing Dayna Dunn in the office that day.

## 2019-01-29 NOTE — Telephone Encounter (Signed)
New Message         Pt c/o Shortness Of Breath: STAT if SOB developed within the last 24 hours or pt is noticeably SOB on the phone  1. Are you currently SOB (can you hear that pt is SOB on the phone)? Yes  2. How long have you been experiencing SOB? 2/3 weeks  3. Are you SOB when sitting or when up moving around? Both  4. Are you currently experiencing any other symptoms? Heart beating fast/racing

## 2019-01-29 NOTE — Progress Notes (Signed)
Cardiology Office Note    Date:  02/01/2019  ID:  [name] [name], DOB 1946-10-31, MRN [id number] PCP:  [name], MD  Cardiologist:  Larae Grooms, MD   Chief Complaint: SOB, palpitations  History of Present Illness:  [name] [name] is a 73 y.o. male with history of CAD (CABG 2000, stent to RCA 2009), paroxysmal atrial fibrillation, PSVT, CKD III, noncompliance, orthostatic hypotension, iron deficiency anemia, prior gastric erosions/ulcer, ?chronic diastolic CHF (no diastolic dysfunction on echo and normal BNP), bipolar depression, DM, HTN, HLD, kidney stones, OSA, RBBB, deconditoning, thyroid disease, spondylosis and back pain who presents for overdue follow-up and evaluation of SOB/palpitations.   He has prior history of chest discomfort, palpitations and dyspnea requiring med titration and repeat caths/monitors over the years. His last PCI was in 2009. A CPX in 2011 was unrevealing, felt to reflect deconditioning and perhaps mild chronotropic incompetence. He had caths in 2015, 2016, and 12/2017 showing stable anatomy with patent bypass grafts and medical therapy recommended with possible component of microvascular angina. In 03/2016 he was admitted near syncope and transient confusion. MRI was negative for stroke. EEG was normal. He was seen by neurology. There was no significant carotid artery stenosis on CTA. Echocardiogram demonstrated normal LV function with mild diastolic dysfunction. No arrhythmias noted on telemetry. TIA could not be ruled out. Aspirin was changed to Plavix. He was bradycardic and his beta blocker dose was reduced. He was readmitted 03/2016 with lightheadedness, confusion, slurred speech and right facial droop, left hand tremors, and intermittent hypotension. There was also reported history of occasional episodes of dizziness, slurred speech, confusion followed by lethargy. Patient would also fall asleep easily without memory of the event. He has since been  diagnosed with narcolepsy. His metoprolol was discontinued at one point but eventually restarted. He also required midodrine previously for orthostasis although does not appear to currently be on this. A monitor did demonstrate atrial fib/flutter so he was changed from Plavix to warfarin. Metoprolol was subsequently increased. In 09/2016 he was started on amiodarone. There does appear to be a history of compliance issues - notably including taking midodrine incorrectly, not reducing amiodarone as instructed (was on 400mg  daily for a while), or - per last OV 06/2018 - not filling amiodarone since 2017. At last OV he was reporting increased palpitations and decreased exercise tolerance with DOE. He was in NSR. BNP was normal. 2D echo 07/2018 showed EF 97-98%, normal diastolic function, mild LAE. Event monitor showed NSR with PACs/PVCs and short runs of SVT, lowest HR 45bpm. Carotid duplex 07/2018 showed minimal plaque without significant stenosis. He was supposed to follow-up 1 month after that visit but never came. In 11/2018 he was admitted for seizures suspected due to noncompliance for financial reasons. He also reported chest pain in setting of noncompliance - this was discussed with [name] who recommended continued medical management with resumption of prior home meds. He did not f/u with neurology 01/12/19 as recommended. He also has not followed up with our Coumadin clinic since 10/2018, citing issues with the flu over the winter. Last labs 11/2018 showed TSH wnl, K 3.6, Cr 1.34, Hgb 12.8 (prev 14 range), troponins negative, BNP wnl, LFTs wnl, INR 2.77; 10/2018 A1C 11.1, LDL 46.  He returns for follow-up today with his wife. He reports over the last 1-2 months he's had worsening DOE with normal activities. In clarifying, though, it sounds like it's been years since he's felt truly well without dyspnea so this is  less of an acute presentaiton. He reports this is what has prompted the heart caths over the  years. It sounds like he has been unwilling to revisit a stress test because he had one before he required heart intervention and was told everything was fine. 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 He also has had mild LEE the last few days. He drinks a lot of diet cola daily and has never limited his fluid intake.  There are days when he sits in a lounge chair all day. He has issues with narcolepsy and insomnia as well. No orthopnea or syncope. He has been gaining weight steadily over the last 2 years -  Was down to 170lb in 2017, currently 209. He previously worked around solvents for many years and also reports possible mold exposure in their living environment which they are actively trying to move from. He also reports palpitations that last a few seconds daily.  He is not sure if he is taking midodrine or not. Wife thinks he ran out but they will call us to clarify. He has not had any recent issues with low BP per their report.   Past Medical History:  Diagnosis Date  . Acquired atrophy of thyroid 06/05/2013   Overview:  July 2014: controlled on synthroid 62mcg since 2012 May 2015: decreased to Synthroid 50 mcg  Last Assessment & Plan:  Pt doing well with most recent TSH within normal range.  Will continue current dosage of Synthroid 21mcg. Pt reminded to take medication on empty stomach. Will continue to monitor with periodic laboratory assessment in 3 months.   . Anemia    hemoglobin 7.4, iron deficiency, January, 2011, 2 unit transfusion, endoscopy normal, capsule endoscopy February, 2011 no small bowel abnormalities.   Most likely source gastric erosions, followed by GI  . Anxiety   . Bradycardia   . CAD (coronary artery disease)    A. CABG in 2000,status post cardiac cath in 2006, 2009 ....continued chest pain and SOB despite oral medication adjestments including Ranexa. B. Cath November 2009/ mRCA - 2.75 x 23 Abbott Xience V drug-eluting stent ...11/26/2008  to distal  RCA leading to acute marginal.  C. Cath 07/2012 for CP - stable anatomy, med rx. d. cath 2015 and 05/30/2015 stable anatomy, consider Myoview if has CP again  . Carotid artery disease ([place]) 08/01/2015   Doppler, May 29, 2015, 1-39% bilateral ICA   . Cerebral ischemia    MRI November, 2010, chronic microvascular ischemia  . CKD (chronic kidney disease), stage III ([place])   . Concussion   . Depression    Bipolar  . Diabetes mellitus ([place])   . Edema   . Essential hypertension 06/05/2013   Overview:  July 2014: Controlled with Bystolic Sep 5732: Changed to atenolol.  Last Assessment & Plan:  Will change to atenolol for cost and follow Improved.  Medication compliance strongly encouraged BP: 116/64 mmHg    Overview:  July 2014: Controlled with Bystolic Sep 2025: Changed to atenolol.  Last Assessment & Plan:  Will change to atenolol for cost and follow  . Falling episodes    these have occurred in the past and again recurring 2011  . Family history of adverse reaction to anesthesia    "mother died during bypass surgery but not sure if it has to do with anesthesia"  . Gastric ulcer   . GERD (gastroesophageal reflux disease)   . H/O medication noncompliance    Due to loss  of insurance  . H/O multiple concussions   . Hard of hearing   . History of blood transfusion 12/20/2013  . Hx of CABG    2000,  / one median sternotomy suture broken her chest x-ray November, 2010, no clinical significance  . Hyperlipidemia   . Hypertension    pt. denies  . Iron deficiency anemia   . Long term (current) use of anticoagulants [Z79.01] 07/20/2016  . Low back pain 06/12/2009   Qualifier: Diagnosis of  By: [name]   . Nephrolithiasis   . Orthostasis   . OSA (obstructive sleep apnea)   . PAF (paroxysmal atrial fibrillation) ([place])    a. dx 2017, started on amiodarone/warfarin but patient intermittently noncompliant.  Marland Kitchen PSVT (paroxysmal supraventricular tachycardia) ([place])   . RBBB 07/09/2009    Qualifier: Diagnosis of  By: [name], MD, [name] [name]   . RLS (restless legs syndrome) 09/19/2009   Qualifier: Diagnosis of  By: [name]   Overview:  July 2014: Controlled with Mirapex  Last Assessment & Plan:  Patient is doing well. Will continue current management and follow clinically.  . Seizure disorder ([place]) 01/09/2017  . Spondylosis    C5-6, C6-7 MRI 2010  . Syncope 03/2016  . Thyroid disease   . Tubulovillous adenoma of colon 2007  . Vitamin D deficiency 05/17/2017  . Wears glasses     Past Surgical History:  Procedure Laterality Date  . ANTERIOR CERVICAL DECOMP/DISCECTOMY FUSION N/A 05/31/2016   Procedure: ANTERIOR CERVICAL DECOMPRESSION/DISCECTOMY FUSION CERVICAL FIVE-SIX,CERVICAL SIX-SEVEN;  Surgeon: [name], MD;  Location: [facility];  Service: Neurosurgery;  Laterality: N/A;  . CARDIAC CATHETERIZATION N/A 05/30/2015   Procedure: Left Heart Cath and Coronary Angiography;  Surgeon: [name], MD;  Location: [facility];  Service: Cardiovascular;  Laterality: N/A;  . COLONOSCOPY    . CORONARY ARTERY BYPASS GRAFT     2000  . ESOPHAGOGASTRODUODENOSCOPY    . LEFT HEART CATH AND CORS/GRAFTS ANGIOGRAPHY N/A 12/15/2017   Procedure: LEFT HEART CATH AND CORS/GRAFTS ANGIOGRAPHY;  Surgeon: [name], MD;  Location: [facility];  Service: Cardiovascular;  Laterality: N/A;  . LEFT HEART CATHETERIZATION WITH CORONARY/GRAFT ANGIOGRAM N/A 08/01/2012   Procedure: LEFT HEART CATHETERIZATION WITH Beatrix Fetters;  Surgeon: [name], MD;  Location: [facility] C[facility];  Service: Cardiovascular;  Laterality: N/A;  . LEFT HEART CATHETERIZATION WITH CORONARY/GRAFT ANGIOGRAM N/A 01/03/2015   Procedure: LEFT HEART CATHETERIZATION WITH Beatrix Fetters;  Surgeon: [name], MD;  Location: [facility] CATH LAB;  Service: Cardiovascular;  Laterality: N/A;  . NASAL SEPTUM SURGERY     UP3  . PERCUTANEOUS CORONARY STENT INTERVENTION (PCI-S)  10/2008    mRCA PCI  2.75 x 23 Abbott Xience V drug-eluting stent   . ULTRASOUND GUIDANCE FOR VASCULAR ACCESS  12/15/2017   Procedure: Ultrasound Guidance For Vascular Access;  Surgeon: [name], MD;  Location: [facility];  Service: Cardiovascular;;    Current Medications: Current Meds  Medication Sig  . atorvastatin (LIPITOR) 40 MG tablet TAKE 1 & 1/2 TABLETS (60 MG TOTAL) BY MOUTH DAILY.  Marland Kitchen diclofenac sodium (VOLTAREN) 1 % GEL Apply 2 g topically daily as needed (for pain in affected area).  . divalproex (DEPAKOTE ER) 250 MG 24 hr tablet Take 3 tablets (750 mg total) by mouth at bedtime.  . furosemide (LASIX) 20 MG tablet TAKE 1 TABLET BY MOUTH DAILY AS NEEDED FOR EDEMA  . glucose  blood (ACCU-CHEK AVIVA) test strip Use as directed to check blood sugar 4 times daily.  DX E11.9  . insulin aspart (NOVOLOG FLEXPEN) 100 UNIT/ML FlexPen 3 times a day (just before each meal) 65-45-65, and pen needles 3/day  . insulin NPH Human (HUMULIN N,NOVOLIN N) 100 UNIT/ML injection Inject 0.72 mLs (72 Units total) into the skin 2 (two) times daily. And syringes 1/day  . isosorbide mononitrate (IMDUR) 60 MG 24 hr tablet Take 2 tablets (120 mg total) by mouth daily.  Marland Kitchen levothyroxine (SYNTHROID, LEVOTHROID) 100 MCG tablet Take 1 tablet (100 mcg total) by mouth daily.  Marland Kitchen loperamide (IMODIUM A-D) 2 MG tablet Take 1 tablet (2 mg total) by mouth 4 (four) times daily as needed for diarrhea or loose stools.  . metoprolol succinate (TOPROL-XL) 50 MG 24 hr tablet TAKE 1&1/2 TABLETS BY MOUTH ONCE A DAY FOR A TOTAL OF 75 MG.  . nitroGLYCERIN (NITROSTAT) 0.4 MG SL tablet Place 1 tablet (0.4 mg total) under the tongue every 5 (five) minutes as needed for chest pain.  . pantoprazole (PROTONIX) 40 MG tablet Take 1 tablet (40 mg total) by mouth daily.  . pramipexole (MIRAPEX) 1 MG tablet Take 0.5-1 tablets (0.5-1 mg total) by mouth at bedtime.  . ranolazine (RANEXA) 500 MG 12 hr tablet TAKE 1 TABLET (500 MG TOTAL) BY  MOUTH 2 (TWO) TIMES DAILY.  Marland Kitchen sertraline (ZOLOFT) 100 MG tablet TAKE 1 TABLET (100 MG TOTAL) BY MOUTH 2 (TWO) TIMES DAILY.  Marland Kitchen tiZANidine (ZANAFLEX) 4 MG tablet Take 1 tablet (4 mg total) by mouth every 6 (six) hours as needed for muscle spasms.  Marland Kitchen warfarin (COUMADIN) 5 MG tablet Take 1 tablet (5 mg total) by mouth at bedtime.     Allergies:   Gabapentin; Morphine; and Shellfish-derived products   Social History   Socioeconomic History  . Marital status: Married    Spouse name: Not on file  . Number of children: 4  . Years of education: 51  . Highest education level: Not on file  Occupational History  . Occupation: retired  Scientific laboratory technician  . Financial resource strain: Not on file  . Food insecurity:    Worry: Not on file    Inability: Not on file  . Transportation needs:    Medical: Not on file    Non-medical: Not on file  Tobacco Use  . Smoking status: Never Smoker  . Smokeless tobacco: Never Used  Substance and Sexual Activity  . Alcohol use: No    Alcohol/week: 0.0 standard drinks    Comment: stopped drinking in 1998  . Drug use: No  . Sexual activity: Not on file  Lifestyle  . Physical activity:    Days per week: Not on file    Minutes per session: Not on file  . Stress: Not on file  Relationships  . Social connections:    Talks on phone: Not on file    Gets together: Not on file    Attends religious service: Not on file    Active member of club or organization: Not on file    Attends meetings of clubs or organizations: Not on file    Relationship status: Not on file  Other Topics Concern  . Not on file  Social History Narrative   Patient is right handed.   Patient does not drink caffeine.     Family History:  The patient's family history includes Cancer in his maternal grandmother; Coronary artery disease in his brother and brother; Diabetes  in his brother, brother, and mother; Heart attack in his unknown relative; Heart failure in his father and mother;  Hypothyroidism in his brother; Irregular heart beat in his daughter; Other in his brother; Pancreatic cancer in his brother; Stroke in his brother.  ROS:   Please see the history of present illness.  All other systems are reviewed and otherwise negative.    PHYSICAL EXAM:   VS:  BP 130/64   Pulse 65   Ht 5\' 9"  (1.753 m)   Wt 207 lb (93.9 kg)   SpO2 96%   BMI 30.57 kg/m   BMI: Body mass index is 30.57 kg/m. GEN: Well nourished, well developed WM in no acute distress HEENT: normocephalic, atraumatic Neck: no JVD, carotid bruits, or masses Cardiac: RRR; no murmurs, rubs, or gallops, trace BLE edema  Respiratory: possibly slightly decreased BS L base otherwise clear to auscultation bilaterally, normal work of breathing GI: soft, nontender, nondistended, + BS MS: no deformity or atrophy Skin: warm and dry, no rash Neuro:  Alert and Oriented x 3, Strength and sensation are intact, follows commands Psych: euthymic mood, full affect  Wt Readings from Last 3 Encounters:  02/01/19 207 lb (93.9 kg)  12/22/18 198 lb 9.6 oz (90.1 kg)  11/06/18 190 lb 4.1 oz (86.3 kg)      Studies/Labs Reviewed:   EKG:  EKG was ordered today and personally reviewed by me and demonstrates NSR 65bpm RBBB, LVH with repol abnormality, left axis deviation  Recent Labs: 02/08/2018: Magnesium 1.9 07/05/2018: NT-Pro BNP 74 11/06/2018: ALT 24; B Natriuretic Peptide 18.5 11/07/2018: BUN 16; Creatinine, Ser 1.34; Hemoglobin 12.8; Platelets 169; Potassium 3.6; Sodium 135; TSH 0.846   Lipid Panel    Component Value Date/Time   CHOL 98 10/26/2018 0911   CHOL 99 (L) 09/27/2017 0823   TRIG 263.0 (H) 10/26/2018 0911   HDL 22.60 (L) 10/26/2018 0911   HDL 30 (L) 09/27/2017 0823   CHOLHDL 4 10/26/2018 0911   VLDL 52.6 (H) 10/26/2018 0911   LDLCALC 32 07/17/2018 1128   LDLCALC 35 09/27/2017 0823   LDLDIRECT 46.0 10/26/2018 0911    Additional studies/ records that were reviewed today include: Summarized  above.    ASSESSMENT & PLAN:   1. Dyspnea - unclear etiology - has been struggling with this for many years per chart review, even had CPX in 2011 for this, with possible suggestion of chronotropic incompetence. Cath 12/2017 with stable anatomy. He declines stress test given that previous imaging had been normal before requiring PCI. Compliance has been an issue as well, complicating his management. Given recent progression in sx will update labs to exclude anemia or new development of CHF. BNP has traditionally been normal. Unclear if symptoms are related to microvascular ischemia, fluid retention, chronotropic incompetence, or perhaps undiagnosed pulmonary disease - but likely also has significant deconditioning compounding the situation. Given his report of solvent and mold exposure will refer to pulmonology for input. I will plan to review case with [name] as well.  2. Palpitations with prior history of paroxysmal atrial fibrillation and PSVT - still with intermittent palpitations. Difficult to manage due to prior history of bradycardia and chronotropic incompetence. Has had several event monitors. Will review with [name] before adjusting meds. He is also due for Coumadin check today. 3. CAD - continue antianginal regimen. Not on ASA due to concomitant warfarin, prior GIB. 4. Orthostatic hypotension - he is not sure if he is taking midodrine or not. Wife thinks he ran  out but they will call us to clarify. He has not had any recent issues with low BP per their report, but need to be cognizant.  Disposition: F/u with [name] in 8 weeks, pending discussion of our plan.  Medication Adjustments/Labs and Tests Ordered: Current medicines are reviewed at length with the patient today.  Concerns regarding medicines are outlined above. Medication changes, Labs and Tests ordered today are summarized above and listed in the Patient Instructions accessible in Encounters.   Signed, Charlie Pitter, PA-C  02/01/2019 9:23 AM    [facility], [place], [place]  [address] Phone: [phone]; Fax: [phone]

## 2019-01-29 NOTE — Telephone Encounter (Signed)
Pt calling today with c/o "heart racing and SOB for the last 3 weeks." He is SOB walking to and from the mailbox and this has increasingly become worse.   Pt states he has been compliant with all medications including lasix, toprol, and warfarin. Last echo showed EF 55-60%. Long term monitor showed short runs SVT with no sustained arrhthymias.   I have scheduled him a visit with Melina Copa, PA to further evaluate his situation as pt is OD on his follow up as well. Pt has verbalized understanding and agrees with plan.

## 2019-02-01 ENCOUNTER — Encounter: Payer: Self-pay | Admitting: Physician Assistant

## 2019-02-01 ENCOUNTER — Ambulatory Visit (INDEPENDENT_AMBULATORY_CARE_PROVIDER_SITE_OTHER): Payer: Medicare PPO | Admitting: Physician Assistant

## 2019-02-01 ENCOUNTER — Ambulatory Visit (INDEPENDENT_AMBULATORY_CARE_PROVIDER_SITE_OTHER): Payer: Medicare PPO | Admitting: Pharmacist

## 2019-02-01 VITALS — BP 130/64 | HR 65 | Ht 69.0 in | Wt 207.0 lb

## 2019-02-01 DIAGNOSIS — Z5181 Encounter for therapeutic drug level monitoring: Secondary | ICD-10-CM | POA: Diagnosis not present

## 2019-02-01 DIAGNOSIS — R002 Palpitations: Secondary | ICD-10-CM | POA: Diagnosis not present

## 2019-02-01 DIAGNOSIS — I951 Orthostatic hypotension: Secondary | ICD-10-CM | POA: Diagnosis not present

## 2019-02-01 DIAGNOSIS — I2510 Atherosclerotic heart disease of native coronary artery without angina pectoris: Secondary | ICD-10-CM

## 2019-02-01 DIAGNOSIS — I480 Paroxysmal atrial fibrillation: Secondary | ICD-10-CM

## 2019-02-01 DIAGNOSIS — Z7901 Long term (current) use of anticoagulants: Secondary | ICD-10-CM

## 2019-02-01 DIAGNOSIS — R0609 Other forms of dyspnea: Secondary | ICD-10-CM | POA: Diagnosis not present

## 2019-02-01 LAB — BASIC METABOLIC PANEL
BUN/Creatinine Ratio: 13 (ref 10–24)
BUN: 16 mg/dL (ref 8–27)
CO2: 21 mmol/L (ref 20–29)
Calcium: 9.5 mg/dL (ref 8.6–10.2)
Chloride: 102 mmol/L (ref 96–106)
Creatinine, Ser: 1.19 mg/dL (ref 0.76–1.27)
GFR calc Af Amer: 71 mL/min/{1.73_m2} (ref >59)
GFR calc non Af Amer: 62 mL/min/{1.73_m2} (ref >59)
Glucose: 93 mg/dL (ref 65–99)
POTASSIUM: 3.7 mmol/L (ref 3.5–5.2)
Sodium: 142 mmol/L (ref 134–144)

## 2019-02-01 LAB — CBC
Hematocrit: 39.9 % (ref 37.5–51.0)
Hemoglobin: 13.8 g/dL (ref 13.0–17.7)
MCH: 30.7 pg (ref 26.6–33.0)
MCHC: 34.6 g/dL (ref 31.5–35.7)
MCV: 89 fL (ref 79–97)
Platelets: 209 10*3/uL (ref 150–450)
RBC: 4.5 x10E6/uL (ref 4.14–5.80)
RDW: 14 % (ref 11.6–15.4)
WBC: 8 10*3/uL (ref 3.4–10.8)

## 2019-02-01 LAB — TSH: TSH: 5.06 u[IU]/mL — ABNORMAL HIGH (ref 0.450–4.500)

## 2019-02-01 LAB — PRO B NATRIURETIC PEPTIDE: NT-Pro BNP: 96 pg/mL (ref 0–376)

## 2019-02-01 LAB — POCT INR: INR: 2.1 (ref 2.0–3.0)

## 2019-02-01 MED FILL — WARFARIN SODIUM 5 MG TABLET: 5 | 30 days supply | Qty: 40 | Fill #0

## 2019-02-01 NOTE — Patient Instructions (Signed)
Description   Continue taking 1 tablet daily except 1.5 tablets on Thursdays.  Recheck in 4 weeks. Call us with any medication changes [phone] Coumadin Clinic. Main # [phone]. Increase intake of greens to 2 servings a week

## 2019-02-01 NOTE — Patient Instructions (Addendum)
Medication Instructions:  Your physician recommends that you continue on your current medications as directed. Please refer to the Current Medication list given to you today.  If you need a refill on your cardiac medications before your next appointment, please call your pharmacy.   Lab work: TODAY:  BMET, CBC, TSH, & PRO BNP  If you have labs (blood work) drawn today and your tests are completely normal, you will receive your results only by: Marland Kitchen MyChart Message (if you have MyChart) OR . A paper copy in the mail If you have any lab test that is abnormal or we need to change your treatment, we will call you to review the results.  Testing/Procedures: None ordered You have been referred to [facility].  THEY WILL CALL YOU WITH AN APPOINTMENT  Follow-Up: Your physician recommends that you schedule a follow-up appointment in: 03/29/2019 ARRIVE AT 8:45 TO SEE DR. VARANASI   Any Other Special Instructions Will Be Listed Below (If Applicable).   For patients with history of fluid retention, we give them these special instructions:  1. Follow a low-salt diet - you are allowed no more than 2,000mg  of sodium per day. Watch your fluid intake. In general, you should not be taking in more than 2 liters of fluid per day (no more than 8 glasses per day). This includes sources of water in foods like soup, coffee, tea, milk, etc. 2. Weigh yourself on the same scale at same time of day and keep a log. 3. Call your doctor: (Anytime you feel any of the following symptoms)  - 3lb weight gain overnight or 5lb within a few days - Shortness of breath, with or without a dry hacking cough  - Swelling in the hands, feet or stomach  - If you have to sleep on extra pillows at night in order to breathe   IT IS IMPORTANT TO LET YOUR DOCTOR KNOW EARLY ON IF YOU ARE HAVING SYMPTOMS SO WE CAN HELP YOU!

## 2019-02-02 ENCOUNTER — Telehealth: Payer: Self-pay | Admitting: *Deleted

## 2019-02-02 NOTE — Telephone Encounter (Signed)
-----   Message from Charlie Pitter, [place] sent at 02/02/2019  9:25 AM EST ----- Please let patient know labs are normal except TSH is high suggesting he is not getting enough thyroid hormone - please ask him to discuss with PCP and route labs their way. I will let him know when I hear back from [name] about further plans given his extensive hx and continued symptoms despite lots of prior heart workup Dayna Dunn PA-C

## 2019-02-02 NOTE — Telephone Encounter (Signed)
Called pt re: lab results, left a message for pt to call back.  

## 2019-02-02 NOTE — Telephone Encounter (Signed)
Follow up   PT is returning call  Please call back

## 2019-02-05 ENCOUNTER — Telehealth: Payer: Self-pay | Admitting: Physician Assistant

## 2019-02-05 ENCOUNTER — Ambulatory Visit: Payer: Medicare PPO | Admitting: Family Medicine

## 2019-02-05 ENCOUNTER — Encounter: Payer: Self-pay | Admitting: Family Medicine

## 2019-02-05 VITALS — BP 110/60 | HR 68 | Temp 97.8°F | Resp 18 | Ht 69.0 in | Wt 204.6 lb

## 2019-02-05 DIAGNOSIS — G3184 Mild cognitive impairment, so stated: Secondary | ICD-10-CM | POA: Diagnosis not present

## 2019-02-05 DIAGNOSIS — IMO0002 Reserved for concepts with insufficient information to code with codable children: Secondary | ICD-10-CM

## 2019-02-05 DIAGNOSIS — N183 Chronic kidney disease, stage 3 unspecified: Secondary | ICD-10-CM

## 2019-02-05 DIAGNOSIS — I10 Essential (primary) hypertension: Secondary | ICD-10-CM | POA: Diagnosis not present

## 2019-02-05 DIAGNOSIS — R51 Headache: Secondary | ICD-10-CM | POA: Diagnosis not present

## 2019-02-05 DIAGNOSIS — M542 Cervicalgia: Secondary | ICD-10-CM | POA: Diagnosis not present

## 2019-02-05 DIAGNOSIS — E669 Obesity, unspecified: Secondary | ICD-10-CM | POA: Diagnosis not present

## 2019-02-05 DIAGNOSIS — E785 Hyperlipidemia, unspecified: Secondary | ICD-10-CM | POA: Diagnosis not present

## 2019-02-05 DIAGNOSIS — E1065 Type 1 diabetes mellitus with hyperglycemia: Secondary | ICD-10-CM

## 2019-02-05 DIAGNOSIS — E1029 Type 1 diabetes mellitus with other diabetic kidney complication: Secondary | ICD-10-CM | POA: Diagnosis not present

## 2019-02-05 DIAGNOSIS — I25118 Atherosclerotic heart disease of native coronary artery with other forms of angina pectoris: Secondary | ICD-10-CM | POA: Diagnosis not present

## 2019-02-05 DIAGNOSIS — R519 Headache, unspecified: Secondary | ICD-10-CM

## 2019-02-05 LAB — HEMOGLOBIN A1C: Hgb A1c MFr Bld: 9.1 % — ABNORMAL HIGH (ref 4.6–6.5)

## 2019-02-05 LAB — COMPREHENSIVE METABOLIC PANEL
ALBUMIN: 4.4 g/dL (ref 3.5–5.2)
ALT: 15 U/L (ref 0–53)
AST: 13 U/L (ref 0–37)
Alkaline Phosphatase: 90 U/L (ref 39–117)
BUN: 22 mg/dL (ref 6–23)
CALCIUM: 9.3 mg/dL (ref 8.4–10.5)
CO2: 28 mEq/L (ref 19–32)
Chloride: 99 mEq/L (ref 96–112)
Creatinine, Ser: 1.47 mg/dL (ref 0.40–1.50)
GFR: 47.3 mL/min — ABNORMAL LOW (ref >60.00)
Glucose, Bld: 336 mg/dL — ABNORMAL HIGH (ref 70–99)
Potassium: 4.2 mEq/L (ref 3.5–5.1)
Sodium: 135 mEq/L (ref 135–145)
Total Bilirubin: 0.5 mg/dL (ref 0.2–1.2)
Total Protein: 7 g/dL (ref 6.0–8.3)

## 2019-02-05 LAB — CBC
HCT: 39.7 % (ref 39.0–52.0)
Hemoglobin: 13.9 g/dL (ref 13.0–17.0)
MCHC: 34.9 g/dL (ref 30.0–36.0)
MCV: 90.3 fl (ref 78.0–100.0)
PLATELETS: 178 10*3/uL (ref 150.0–400.0)
RBC: 4.4 Mil/uL (ref 4.22–5.81)
RDW: 14.6 % (ref 11.5–15.5)
WBC: 7.3 10*3/uL (ref 4.0–10.5)

## 2019-02-05 LAB — LIPID PANEL
CHOLESTEROL: 168 mg/dL (ref 0–200)
HDL: 30 mg/dL — ABNORMAL LOW (ref >39.00)
NonHDL: 137.79
Total CHOL/HDL Ratio: 6
Triglycerides: 358 mg/dL — ABNORMAL HIGH (ref 0.0–149.0)
VLDL: 71.6 mg/dL — ABNORMAL HIGH (ref 0.0–40.0)

## 2019-02-05 LAB — LDL CHOLESTEROL, DIRECT: Direct LDL: 107 mg/dL

## 2019-02-05 MED ORDER — INSULIN ASPART 100 UNIT/ML FLEXPEN: PEN_INJECTOR | SUBCUTANEOUS | 0 refills | Status: DC

## 2019-02-05 MED ORDER — TIZANIDINE HCL 4 MG PO TABS: 4.0000 mg | ORAL_TABLET | Freq: Four times a day (QID) | ORAL | 2 refills | Status: DC | PRN

## 2019-02-05 MED ORDER — INSULIN NPH (HUMAN) (ISOPHANE) 100 UNIT/ML ~~LOC~~ SUSP: 72.0000 [IU] | Freq: Two times a day (BID) | SUBCUTANEOUS | 0 refills | Status: DC

## 2019-02-05 MED ORDER — LEVOTHYROXINE SODIUM 100 MCG PO TABS: 100.0000 ug | ORAL_TABLET | Freq: Every day | ORAL | 1 refills | Status: DC

## 2019-02-05 MED ORDER — GLUCOSE BLOOD VI STRP: ORAL_STRIP | 6 refills | Status: DC

## 2019-02-05 MED FILL — LEVOTHYROXINE 100 MCG TAB: 100 | 90 days supply | Qty: 90 | Fill #0

## 2019-02-05 MED FILL — tiZANidine HCL 4 MG TABS: 4 | 10 days supply | Qty: 40 | Fill #0

## 2019-02-05 MED FILL — ACCU-CHEK AVIVA PLUS TEST S: 50 days supply | Qty: 200 | Fill #0

## 2019-02-05 NOTE — Assessment & Plan Note (Signed)
Encouraged DASH diet, decrease po intake and increase exercise as tolerated. Needs 7-8 hours of sleep nightly. Avoid trans fats, eat small, frequent meals every 4-5 hours with lean proteins, complex carbs and healthy fats. Minimize simple carbs, GMO foods. Consider Healthy Weight and Wellness.
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
## 2019-02-05 NOTE — Telephone Encounter (Signed)
lpmtcb 3/2

## 2019-02-05 NOTE — Assessment & Plan Note (Signed)
Encouraged moist heat and gentle stretching as tolerated. May try NSAIDs and prescription meds as directed and report if symptoms worsen or seek immediate care. Refill on tizanidine
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
## 2019-02-05 NOTE — Assessment & Plan Note (Signed)
Check cmp and hydrate 

## 2019-02-05 NOTE — Assessment & Plan Note (Signed)
Well controlled, no changes to meds. Encouraged heart healthy diet such as the DASH diet and exercise as tolerated.  °

## 2019-02-05 NOTE — Patient Instructions (Addendum)
Consider Melatonin nightly 2-10 mg at bedtime.  Insomnia Insomnia is a sleep disorder that makes it difficult to fall asleep or stay asleep. Insomnia can cause fatigue, low energy, difficulty concentrating, mood swings, and poor performance at work or school. There are three different ways to classify insomnia:  Difficulty falling asleep.  Difficulty staying asleep.  Waking up too early in the morning. Any type of insomnia can be long-term (chronic) or short-term (acute). Both are common. Short-term insomnia usually lasts for three months or less. Chronic insomnia occurs at least three times a week for longer than three months. What are the causes? Insomnia may be caused by another condition, situation, or substance, such as:  Anxiety.  Certain medicines.  Gastroesophageal reflux disease (GERD) or other gastrointestinal conditions.  Asthma or other breathing conditions.  Restless legs syndrome, sleep apnea, or other sleep disorders.  Chronic pain.  Menopause.  Stroke.  Abuse of alcohol, tobacco, or illegal drugs.  Mental health conditions, such as depression.  Caffeine.  Neurological disorders, such as Alzheimer's disease.  An overactive thyroid (hyperthyroidism). Sometimes, the cause of insomnia may not be known. What increases the risk? Risk factors for insomnia include:  Gender. Women are affected more often than men.  Age. Insomnia is more common as you get older.  Stress.  Lack of exercise.  Irregular work schedule or working night shifts.  Traveling between different time zones.  Certain medical and mental health conditions. What are the signs or symptoms? If you have insomnia, the main symptom is having trouble falling asleep or having trouble staying asleep. This may lead to other symptoms, such as:  Feeling fatigued or having low energy.  Feeling nervous about going to sleep.  Not feeling rested in the morning.  Having trouble  concentrating.  Feeling irritable, anxious, or depressed. How is this diagnosed? This condition may be diagnosed based on:  Your symptoms and medical history. Your health care provider may ask about: ? Your sleep habits. ? Any medical conditions you have. ? Your mental health.  A physical exam. How is this treated? Treatment for insomnia depends on the cause. Treatment may focus on treating an underlying condition that is causing insomnia. Treatment may also include:  Medicines to help you sleep.  Counseling or therapy.  Lifestyle adjustments to help you sleep better. Follow these instructions at home: Eating and drinking   Limit or avoid alcohol, caffeinated beverages, and cigarettes, especially close to bedtime. These can disrupt your sleep.  Do not eat a large meal or eat spicy foods right before bedtime. This can lead to digestive discomfort that can make it hard for you to sleep. Sleep habits   Keep a sleep diary to help you and your health care provider figure out what could be causing your insomnia. Write down: ? When you sleep. ? When you wake up during the night. ? How well you sleep. ? How rested you feel the next day. ? Any side effects of medicines you are taking. ? What you eat and drink.  Make your bedroom a dark, comfortable place where it is easy to fall asleep. ? Put up shades or blackout curtains to block light from outside. ? Use a white noise machine to block noise. ? Keep the temperature cool.  Limit screen use before bedtime. This includes: ? Watching TV. ? Using your smartphone, tablet, or computer.  Stick to a routine that includes going to bed and waking up at the same times every day and night.  This can help you fall asleep faster. Consider making a quiet activity, such as reading, part of your nighttime routine.  Try to avoid taking naps during the day so that you sleep better at night.  Get out of bed if you are still awake after 15  minutes of trying to sleep. Keep the lights down, but try reading or doing a quiet activity. When you feel sleepy, go back to bed. General instructions  Take over-the-counter and prescription medicines only as told by your health care provider.  Exercise regularly, as told by your health care provider. Avoid exercise starting several hours before bedtime.  Use relaxation techniques to manage stress. Ask your health care provider to suggest some techniques that may work well for you. These may include: ? Breathing exercises. ? Routines to release muscle tension. ? Visualizing peaceful scenes.  Make sure that you drive carefully. Avoid driving if you feel very sleepy.  Keep all follow-up visits as told by your health care provider. This is important. Contact a health care provider if:  You are tired throughout the day.  You have trouble in your daily routine due to sleepiness.  You continue to have sleep problems, or your sleep problems get worse. Get help right away if:  You have serious thoughts about hurting yourself or someone else. If you ever feel like you may hurt yourself or others, or have thoughts about taking your own life, get help right away. You can go to your nearest emergency department or call:  Your local emergency services (911 in the [place]  A suicide crisis helpline, such as the [facility] at 934 635 4601. This is open 24 hours a day. Summary  Insomnia is a sleep disorder that makes it difficult to fall asleep or stay asleep.  Insomnia can be long-term (chronic) or short-term (acute).  Treatment for insomnia depends on the cause. Treatment may focus on treating an underlying condition that is causing insomnia.  Keep a sleep diary to help you and your health care provider figure out what could be causing your insomnia. This information is not intended to replace advice given to you by your health care provider. Make sure you discuss  any questions you have with your health care provider. Document Released: 11/19/2000 Document Revised: 09/01/2017 Document Reviewed: 09/01/2017 Elsevier Interactive Patient Education  2019 [employer].

## 2019-02-05 NOTE — Telephone Encounter (Signed)
Please call pt and let him know I spoke with [name] who recommends we continue current cardiac meds and await input from the pulmonary workup. Keep f/u 03/2019 with [name]. Gradually increase activity as able - every little bit counts, listen to his body. Over the years it looks like he has gained about 40lb so this may also be contributing to sx.  Wife was also supposed to inform whether or not pt was taking midodrine so we can update med list. Compliance has been an issue in the past due to financial issues so it sounds like sometimes they just don't pick up meds. If not currently taking, do not start - just hold course.  Dayna Dunn PA-C

## 2019-02-05 NOTE — Assessment & Plan Note (Signed)
Following closely with cardiology

## 2019-02-05 NOTE — Assessment & Plan Note (Signed)
Occurs when his neck pain gets bad. Sees neurology soon and will discuss options with them

## 2019-02-05 NOTE — Telephone Encounter (Signed)
Spoke to patient and gave him Dayna's recommendations.  He verbalized understanding.

## 2019-02-06 MED ORDER — ATORVASTATIN CALCIUM 80 MG PO TABS: 80.0000 mg | ORAL_TABLET | Freq: Every day | ORAL | 3 refills | Status: DC

## 2019-02-06 MED FILL — ATORVASTATIN 80 MG TABLET: 80 | 90 days supply | Qty: 90 | Fill #0

## 2019-02-06 NOTE — Telephone Encounter (Signed)
Returned pts call re: lab results. See result note.

## 2019-02-06 NOTE — Telephone Encounter (Signed)
Follow Up:    Returning your call, concerning his results.

## 2019-02-06 NOTE — Addendum Note (Signed)
Addended by: Magdalene Molly A on: 02/06/2019 05:39 PM   Modules accepted: Orders

## 2019-02-08 ENCOUNTER — Other Ambulatory Visit: Payer: Self-pay | Admitting: Neurology

## 2019-02-08 MED FILL — ISOSORBIDE MN ER 60 MG TAB: 60 | 90 days supply | Qty: 180 | Fill #0

## 2019-02-15 ENCOUNTER — Institutional Professional Consult (permissible substitution): Payer: Medicare PPO | Admitting: Pulmonary Disease

## 2019-02-15 ENCOUNTER — Telehealth: Payer: Self-pay | Admitting: Pulmonary Disease

## 2019-02-15 NOTE — Telephone Encounter (Signed)
LMTC x 1 - Need to reschedule Consult with Dr Valeta Harms from today

## 2019-02-16 ENCOUNTER — Other Ambulatory Visit: Payer: Self-pay | Admitting: *Deleted

## 2019-02-16 ENCOUNTER — Ambulatory Visit: Payer: Medicare PPO | Admitting: Endocrinology

## 2019-02-16 MED ORDER — WARFARIN SODIUM 5 MG PO TABS: ORAL_TABLET | ORAL | 3 refills | Status: DC

## 2019-02-16 MED ORDER — METOPROLOL SUCCINATE ER 50 MG PO TB24: ORAL_TABLET | ORAL | 3 refills | Status: DC

## 2019-02-16 NOTE — Telephone Encounter (Signed)
Will close this encounter. Per patient's chart, he has been scheduled to see BI on 4/1.

## 2019-02-26 ENCOUNTER — Ambulatory Visit: Payer: Medicare PPO | Admitting: Endocrinology

## 2019-02-27 ENCOUNTER — Telehealth: Payer: Self-pay

## 2019-02-27 ENCOUNTER — Telehealth: Payer: Self-pay | Admitting: Family Medicine

## 2019-02-27 NOTE — Telephone Encounter (Signed)
LMOM FOR PRESCREEN AND DRIVE THRU 

## 2019-02-27 NOTE — Telephone Encounter (Signed)
Copied from [place] [id number]. Topic: Quick Communication - Rx Refill/Question >> Feb 27, 2019  8:28 AM Leward Quan A wrote: Medication: pantoprazole (PROTONIX) 40 MG tablet, atorvastatin (LIPITOR) 80 MG tablet, furosemide (LASIX) 20 MG tablet, isosorbide mononitrate (IMDUR) 60 MG 24 hr tablet, levothyroxine (SYNTHROID, LEVOTHROID) 100 MCG tablet, pramipexole (MIRAPEX) 1 MG tablet, sertraline (ZOLOFT) 100 MG tablet, Accu Chek Aviva Plus glucometer, glucose blood (ACCU-CHEK AVIVA) test strip, Accu-Chek Softclix Lancets, bd ultra-fine insulin syringes 31g 1cc 28mm, bd ultra-fine insulin syringes 32g 1cc 66mm  Has the patient contacted their pharmacy? Yes.   (Agent: If no, request that the patient contact the pharmacy for the refill.) (Agent: If yes, when and what did the pharmacy advise?)  Preferred Pharmacy (with phone number or street name): [facility], [place] [phone] (Phone) [phone] (Fax)    Agent: Please be advised that RX refills may take up to 3 business days. We ask that you follow-up with your pharmacy.

## 2019-02-28 NOTE — Telephone Encounter (Signed)

## 2019-03-01 ENCOUNTER — Other Ambulatory Visit: Payer: Self-pay

## 2019-03-01 ENCOUNTER — Ambulatory Visit (INDEPENDENT_AMBULATORY_CARE_PROVIDER_SITE_OTHER): Payer: Medicare PPO | Admitting: Pharmacist

## 2019-03-01 DIAGNOSIS — Z7901 Long term (current) use of anticoagulants: Secondary | ICD-10-CM

## 2019-03-01 DIAGNOSIS — I480 Paroxysmal atrial fibrillation: Secondary | ICD-10-CM | POA: Diagnosis not present

## 2019-03-01 DIAGNOSIS — Z5181 Encounter for therapeutic drug level monitoring: Secondary | ICD-10-CM | POA: Diagnosis not present

## 2019-03-01 LAB — POCT INR: INR: 3.8 — AB (ref 2.0–3.0)

## 2019-03-02 ENCOUNTER — Other Ambulatory Visit: Payer: Self-pay

## 2019-03-02 MED ORDER — PRAMIPEXOLE DIHYDROCHLORIDE 1 MG PO TABS: 0.5000 mg | ORAL_TABLET | Freq: Every day | ORAL | 0 refills | Status: DC

## 2019-03-02 MED ORDER — PANTOPRAZOLE SODIUM 40 MG PO TBEC: 40.0000 mg | DELAYED_RELEASE_TABLET | Freq: Every day | ORAL | 0 refills | Status: DC

## 2019-03-03 MED ORDER — FUROSEMIDE 20 MG PO TABS: 20.0000 mg | ORAL_TABLET | Freq: Every day | ORAL | 0 refills | Status: DC

## 2019-03-06 ENCOUNTER — Institutional Professional Consult (permissible substitution): Payer: Medicare PPO | Admitting: Neurology

## 2019-03-07 ENCOUNTER — Institutional Professional Consult (permissible substitution): Payer: Medicare PPO | Admitting: Pulmonary Disease

## 2019-03-09 ENCOUNTER — Other Ambulatory Visit: Payer: Self-pay | Admitting: Family Medicine

## 2019-03-09 MED FILL — MIDODRINE HCL 10 MG TABLET: 10 | 30 days supply | Qty: 90 | Fill #5

## 2019-03-13 MED FILL — RANOLAZINE ER 500 MG TABLET: 500 | 30 days supply | Qty: 60 | Fill #0

## 2019-03-20 MED FILL — NovoLIN N 100 UNIT/ML SUSP: 100 | 13 days supply | Qty: 20 | Fill #0

## 2019-03-20 MED FILL — NOVOLOG FLEXPEN SYRINGE: 100 | 8 days supply | Qty: 15 | Fill #0

## 2019-03-21 ENCOUNTER — Telehealth: Payer: Self-pay

## 2019-03-21 NOTE — Telephone Encounter (Signed)
Called pt to set up possible evisit. He says he wants to talk with his daughter first to see if she will be able to help him with the visit he will then call the office.

## 2019-03-27 ENCOUNTER — Telehealth: Payer: Self-pay

## 2019-03-27 NOTE — Telephone Encounter (Signed)
Called pt to set up evisit, he says his daughter is coming in town today and he will talk with her to see if she will be able to help him with his evisit. Pt says he will call the office later today.

## 2019-03-27 NOTE — Telephone Encounter (Signed)
lmom for prescreen  

## 2019-03-28 ENCOUNTER — Telehealth: Payer: Self-pay | Admitting: Interventional Cardiology

## 2019-03-28 NOTE — Telephone Encounter (Signed)
Left message for patient to call back regarding his appointment with [name] tomorrow

## 2019-03-28 NOTE — Telephone Encounter (Signed)
Did not call patient again, already spoke with him this morning about COVID prescreening before INR check tomorrow. Called pt, nothing else needed.

## 2019-03-28 NOTE — Telephone Encounter (Signed)
Follow up: ° ° ° °Patient returning your call. °

## 2019-03-28 NOTE — Telephone Encounter (Signed)
Already addressed, see separate encounter.

## 2019-03-28 NOTE — Telephone Encounter (Signed)
1. Do you currently have a fever? No 2. Have you recently travelled on a cruise, internationally, or to [place], [place], [place], [place], [place], or [place], [place] ([employer]) ? No 3. Have you been in contact with someone that is currently pending confirmation of Covid19 testing or has been confirmed to have the Covid19 virus?  No 4. Are you currently experiencing fatigue or cough? No  Pt. Advised that we are restricting visitors at this time and anyone present in the vehicle should meet the above criteria as well. Advised that visit will be at curbside for finger stick ONLY and will receive call with instructions. Pt also advised to please bring own pen for signature of arrival document.   

## 2019-03-28 NOTE — Telephone Encounter (Signed)
New Message ° ° ° °Pt is returning call  ° ° ° °Please call back  °

## 2019-03-28 NOTE — Telephone Encounter (Signed)
Virtual Visit Pre-Appointment Phone Call   TELEPHONE CALL NOTE  [name] [name] has been deemed a candidate for a follow-up tele-health visit to limit community exposure during the Covid-19 pandemic. I spoke with the patient via phone to ensure availability of phone/video source, confirm preferred email & phone number, and discuss instructions and expectations.  I reminded [name] [name] to be prepared with any vital sign and/or heart rhythm information that could potentially be obtained via home monitoring, at the time of his visit. I reminded [name] [name] to expect a phone call prior to his visit.  Patient agrees to consent below.  [name], RN 03/28/2019 1:45 PM    FULL LENGTH CONSENT FOR TELE-HEALTH VISIT   I hereby voluntarily request, consent and authorize [facility] and its employed or contracted physicians, physician assistants, nurse practitioners or other licensed health care professionals (the Practitioner), to provide me with telemedicine health care services (the "Services") as deemed necessary by the treating Practitioner. I acknowledge and consent to receive the Services by the Practitioner via telemedicine. I understand that the telemedicine visit will involve communicating with the Practitioner through live audiovisual communication technology and the disclosure of certain medical information by electronic transmission. I acknowledge that I have been given the opportunity to request an in-person assessment or other available alternative prior to the telemedicine visit and am voluntarily participating in the telemedicine visit.  I understand that I have the right to withhold or withdraw my consent to the use of telemedicine in the course of my care at any time, without affecting my right to future care or treatment, and that the Practitioner or I may terminate the telemedicine visit at any time. I understand that I have the right to inspect all  information obtained and/or recorded in the course of the telemedicine visit and may receive copies of available information for a reasonable fee.  I understand that some of the potential risks of receiving the Services via telemedicine include:  Marland Kitchen Delay or interruption in medical evaluation due to technological equipment failure or disruption; . Information transmitted may not be sufficient (e.g. poor resolution of images) to allow for appropriate medical decision making by the Practitioner; and/or  . In rare instances, security protocols could fail, causing a breach of personal health information.  Furthermore, I acknowledge that it is my responsibility to provide information about my medical history, conditions and care that is complete and accurate to the best of my ability. I acknowledge that Practitioner's advice, recommendations, and/or decision may be based on factors not within their control, such as incomplete or inaccurate data provided by me or distortions of diagnostic images or specimens that may result from electronic transmissions. I understand that the practice of medicine is not an exact science and that Practitioner makes no warranties or guarantees regarding treatment outcomes. I acknowledge that I will receive a copy of this consent concurrently upon execution via email to the email address I last provided but may also request a printed copy by calling the office of [facility].    I understand that my insurance will be billed for this visit.   I have read or had this consent read to me. . I understand the contents of this consent, which adequately explains the benefits and risks of the Services being provided via telemedicine.  . I have been provided ample opportunity to ask questions regarding this consent and the Services and have had my questions answered to my satisfaction. Marland Kitchen  I give my informed consent for the services to be provided through the use of telemedicine in my  medical care  By participating in this telemedicine visit I agree to the above.

## 2019-03-29 ENCOUNTER — Encounter: Payer: Self-pay | Admitting: Interventional Cardiology

## 2019-03-29 ENCOUNTER — Other Ambulatory Visit: Payer: Self-pay

## 2019-03-29 ENCOUNTER — Telehealth (INDEPENDENT_AMBULATORY_CARE_PROVIDER_SITE_OTHER): Payer: Medicare PPO | Admitting: Interventional Cardiology

## 2019-03-29 DIAGNOSIS — I2510 Atherosclerotic heart disease of native coronary artery without angina pectoris: Secondary | ICD-10-CM | POA: Diagnosis not present

## 2019-03-29 DIAGNOSIS — Z951 Presence of aortocoronary bypass graft: Secondary | ICD-10-CM | POA: Diagnosis not present

## 2019-03-29 DIAGNOSIS — E782 Mixed hyperlipidemia: Secondary | ICD-10-CM | POA: Diagnosis not present

## 2019-03-29 DIAGNOSIS — I4821 Permanent atrial fibrillation: Secondary | ICD-10-CM | POA: Diagnosis not present

## 2019-03-29 NOTE — Progress Notes (Signed)
Virtual Visit via Video Note   This visit type was conducted due to national recommendations for restrictions regarding the COVID-19 Pandemic (e.g. social distancing) in an effort to limit this patient's exposure and mitigate transmission in our community.  Due to his co-morbid illnesses, this patient is at least at moderate risk for complications without adequate follow up.  This format is felt to be most appropriate for this patient at this time.  All issues noted in this document were discussed and addressed.  A limited physical exam was performed with this format.  Please refer to the patient's chart for his consent to telehealth for [facility].   Evaluation Performed:  Follow-up visit  Date:  03/29/2019   ID:  [name] [name], DOB 10-Dec-1945, MRN [id number]  Patient Location: Home Provider Location: Home  PCP:  [name], MD  Cardiologist:  Larae Grooms, MD  Electrophysiologist:  None   Chief Complaint:  CAD  History of Present Illness:    [name] [name] is a 73 y.o. male with history of CAD (CABG 2000, stent to RCA 2009), paroxysmal atrial fibrillation, PSVT, CKD III, noncompliance, orthostatic hypotension, iron deficiency anemia, prior gastric erosions/ulcer, ?chronic diastolic CHF (no diastolic dysfunction on echo and normal BNP), bipolar depression, DM, HTN, HLD, kidney stones, OSA, RBBB, deconditoning, thyroid disease, spondylosis and back pain.  He has prior history of chest discomfort, palpitations and dyspnea requiring med titration and repeat caths/monitors over the years. His last PCI was in 2009. A CPX in 2011 was unrevealing, felt to reflect deconditioning and perhaps mild chronotropic incompetence. He had caths in 2015, 2016, and 12/2017 showing stable anatomy with patent bypass grafts and medical therapy recommended with possible component of microvascular angina. In 03/2016 he was admitted near syncope and transient confusion. MRI was negative for  stroke. EEG was normal. He was seen by neurology. There was no significant carotid artery stenosis on CTA. Echocardiogram demonstrated normal LV function with mild diastolic dysfunction. No arrhythmias noted on telemetry. TIA could not be ruled out. Aspirin was changed to Plavix. He was bradycardic and his beta blocker dose was reduced. He was readmitted 03/2016 with lightheadedness, confusion, slurred speech and right facial droop, left hand tremors, and intermittent hypotension. There was also reported history of occasional episodes of dizziness, slurred speech, confusion followed by lethargy. Patient would also fall asleep easily without memory of the event. He has since been diagnosed with narcolepsy. His metoprolol was discontinued at one point but eventually restarted. He also required midodrine previously for orthostasis although does not appear to currently be on this. A monitor did demonstrate atrial fib/flutter so he was changed from Plavix to warfarin. Metoprolol was subsequently increased. In 09/2016 he was started on amiodarone. There does appear to be a history of compliance issues - notably including taking midodrine incorrectly, not reducing amiodarone as instructed (was on 400mg  daily for a while), or - per last OV 06/2018 - not filling amiodarone since 2017. At last OV he was reporting increased palpitations and decreased exercise tolerance with DOE. He was in NSR. BNP was normal. 2D echo 07/2018 showed EF 19-62%, normal diastolic function, mild LAE. Event monitor showed NSR with PACs/PVCs and short runs of SVT, lowest HR 45bpm. Carotid duplex 07/2018 showed minimal plaque without significant stenosis. He was supposed to follow-up 1 month after that visit but never came. In 11/2018 he was admitted for seizures suspected due to noncompliance for financial reasons. He also reported chest pain in setting of noncompliance.  He has had worsening shortness of breath over the past few months.  He feels this  may be due to weight gain.  He has not been exercising regularly.  He has been consuming sweets at home.  His diabetes has not been well controlled by his labs done in March 2020.  Denies : Chest pain. Dizziness. Leg edema. Nitroglycerin use. Orthopnea. Palpitations. Paroxysmal nocturnal dyspnea. Syncope.    The patient does not have symptoms concerning for COVID-19 infection (fever, chills, cough, or new shortness of breath).    Past Medical History:  Diagnosis Date   Acquired atrophy of thyroid 06/05/2013   Overview:  July 2014: controlled on synthroid 67mcg since 2012 May 2015: decreased to Synthroid 50 mcg  Last Assessment & Plan:  Pt doing well with most recent TSH within normal range.  Will continue current dosage of Synthroid 45mcg. Pt reminded to take medication on empty stomach. Will continue to monitor with periodic laboratory assessment in 3 months.    Anemia    hemoglobin 7.4, iron deficiency, January, 2011, 2 unit transfusion, endoscopy normal, capsule endoscopy February, 2011 no small bowel abnormalities.   Most likely source gastric erosions, followed by GI   Anxiety    Bradycardia    CAD (coronary artery disease)    A. CABG in 2000,status post cardiac cath in 2006, 2009 ....continued chest pain and SOB despite oral medication adjestments including Ranexa. B. Cath November 2009/ mRCA - 2.75 x 23 Abbott Xience V drug-eluting stent ...11/26/2008 to distal  RCA leading to acute marginal.  C. Cath 07/2012 for CP - stable anatomy, med rx. d. cath 2015 and 05/30/2015 stable anatomy, consider Myoview if has CP again   Carotid artery disease ([place]) 08/01/2015   Doppler, May 29, 2015, 1-39% bilateral ICA    Cerebral ischemia    MRI November, 2010, chronic microvascular ischemia   CKD (chronic kidney disease), stage III ([place])    Concussion    Depression    Bipolar   Diabetes mellitus ([place])    Edema    Essential hypertension 06/05/2013   Overview:  July 2014: Controlled with  Bystolic Sep 4709: Changed to atenolol.  Last Assessment & Plan:  Will change to atenolol for cost and follow Improved.  Medication compliance strongly encouraged BP: 116/64 mmHg    Overview:  July 2014: Controlled with Bystolic Sep 6283: Changed to atenolol.  Last Assessment & Plan:  Will change to atenolol for cost and follow   Falling episodes    these have occurred in the past and again recurring 2011   Family history of adverse reaction to anesthesia    "mother died during bypass surgery but not sure if it has to do with anesthesia"   Gastric ulcer    GERD (gastroesophageal reflux disease)    H/O medication noncompliance    Due to loss of insurance   H/O multiple concussions    Hard of hearing    History of blood transfusion 12/20/2013   Hx of CABG    2000,  / one median sternotomy suture broken her chest x-ray November, 2010, no clinical significance   Hyperlipidemia    Hypertension    pt. denies   Iron deficiency anemia    Long term (current) use of anticoagulants [Z79.01] 07/20/2016   Low back pain 06/12/2009   Qualifier: Diagnosis of  By: [name]    Nephrolithiasis    Orthostasis    OSA (obstructive sleep apnea)    PAF (paroxysmal atrial  fibrillation) ([place])    a. dx 2017, started on amiodarone/warfarin but patient intermittently noncompliant.   PSVT (paroxysmal supraventricular tachycardia) ([place])    RBBB 07/09/2009   Qualifier: Diagnosis of  By: [name], MD, [facility], [name]    RLS (restless legs syndrome) 09/19/2009   Qualifier: Diagnosis of  By: [name]   Overview:  July 2014: Controlled with Mirapex  Last Assessment & Plan:  Patient is doing well. Will continue current management and follow clinically.   Seizure disorder ([place]) 01/09/2017   Spondylosis    C5-6, C6-7 MRI 2010   Syncope 03/2016   Thyroid disease    Tubulovillous adenoma of colon 2007   Vitamin D deficiency 05/17/2017   Wears glasses    Past Surgical History:    Procedure Laterality Date   ANTERIOR CERVICAL DECOMP/DISCECTOMY FUSION N/A 05/31/2016   Procedure: ANTERIOR CERVICAL DECOMPRESSION/DISCECTOMY FUSION CERVICAL FIVE-SIX,CERVICAL SIX-SEVEN;  Surgeon: [name], MD;  Location: [place] NEURO ORS;  Service: Neurosurgery;  Laterality: N/A;   CARDIAC CATHETERIZATION N/A 05/30/2015   Procedure: Left Heart Cath and Coronary Angiography;  Surgeon: [name], MD;  Location: [place] CV LAB;  Service: Cardiovascular;  Laterality: N/A;   COLONOSCOPY     CORONARY ARTERY BYPASS GRAFT     2000   ESOPHAGOGASTRODUODENOSCOPY     LEFT HEART CATH AND CORS/GRAFTS ANGIOGRAPHY N/A 12/15/2017   Procedure: LEFT HEART CATH AND CORS/GRAFTS ANGIOGRAPHY;  Surgeon: [name], MD;  Location: [place] CV LAB;  Service: Cardiovascular;  Laterality: N/A;   LEFT HEART CATHETERIZATION WITH CORONARY/GRAFT ANGIOGRAM N/A 08/01/2012   Procedure: LEFT HEART CATHETERIZATION WITH Beatrix Fetters;  Surgeon: [name], MD;  Location[facility] CATH LAB;  Service: Cardiovascular;  Laterality: N/A;   LEFT HEART CATHETERIZATION WITH CORONARY/GRAFT ANGIOGRAM N/A 01/03/2015   Procedure: LEFT HEART CATHETERIZATION WITH Beatrix Fetters;  Surgeon: [name], MD;  Location: [facility] CATH LAB;  Service: Cardiovascular;  Laterality: N/A;   NASAL SEPTUM SURGERY     UP3   PERCUTANEOUS CORONARY STENT INTERVENTION (PCI-S)  10/2008   mRCA PCI  2.75 x 23 Abbott Xience V drug-eluting stent    ULTRASOUND GUIDANCE FOR VASCULAR ACCESS  12/15/2017   Procedure: Ultrasound Guidance For Vascular Access;  Surgeon: [name], MD;  Location: [place] CV LAB;  Service: Cardiovascular;;     Current Meds  Medication Sig   atorvastatin (LIPITOR) 80 MG tablet Take 1 tablet (80 mg total) by mouth daily.   diclofenac sodium (VOLTAREN) 1 % GEL Apply 2 g topically daily as needed (for pain in affected area).   divalproex (DEPAKOTE ER) 250 MG 24 hr tablet Take 3  tablets (750 mg total) by mouth at bedtime.   furosemide (LASIX) 20 MG tablet Take 1 tablet (20 mg total) by mouth daily.   glucose blood (ACCU-CHEK AVIVA) test strip Use as directed to check blood sugar 4 times daily.  DX E11.9   insulin aspart (NOVOLOG FLEXPEN) 100 UNIT/ML FlexPen 3 times a day (just before each meal) 65-45-65, and pen needles 3/day   insulin NPH Human (HUMULIN N,NOVOLIN N) 100 UNIT/ML injection Inject 0.72 mLs (72 Units total) into the skin 2 (two) times daily. And syringes 1/day   isosorbide mononitrate (IMDUR) 60 MG 24 hr tablet Take 2 tablets (120 mg total) by mouth daily.   levothyroxine (SYNTHROID, LEVOTHROID) 100 MCG tablet Take 1 tablet (100 mcg total) by mouth daily.   loperamide (IMODIUM A-D) 2 MG tablet Take 1 tablet (2  mg total) by mouth 4 (four) times daily as needed for diarrhea or loose stools.   metoprolol succinate (TOPROL-XL) 50 MG 24 hr tablet TAKE 1&1/2 TABLETS BY MOUTH ONCE A DAY FOR A TOTAL OF 75 MG.   midodrine (PROAMATINE) 10 MG tablet Take 20 mg by mouth daily.   nitroGLYCERIN (NITROSTAT) 0.4 MG SL tablet Place 1 tablet (0.4 mg total) under the tongue every 5 (five) minutes as needed for chest pain.   pantoprazole (PROTONIX) 40 MG tablet Take 1 tablet (40 mg total) by mouth daily.   pramipexole (MIRAPEX) 1 MG tablet Take 0.5-1 tablets (0.5-1 mg total) by mouth at bedtime.   ranolazine (RANEXA) 500 MG 12 hr tablet TAKE 1 TABLET (500 MG TOTAL) BY MOUTH 2 (TWO) TIMES DAILY.   sertraline (ZOLOFT) 100 MG tablet TAKE 1 TABLET (100 MG TOTAL) BY MOUTH 2 (TWO) TIMES DAILY.   tiZANidine (ZANAFLEX) 4 MG tablet Take 1 tablet (4 mg total) by mouth every 6 (six) hours as needed for muscle spasms.   Vitamin D, Ergocalciferol, (DRISDOL) 1.25 MG (50000 UT) CAPS capsule Take 1 capsule (50,000 Units total) by mouth every 7 (seven) days.   warfarin (COUMADIN) 5 MG tablet Take 1 to 1.5 tablets daily as directed by Coumadin clinic     Allergies:    Gabapentin; Morphine; and Shellfish-derived products   Social History   Tobacco Use   Smoking status: Never Smoker   Smokeless tobacco: Never Used  Substance Use Topics   Alcohol use: No    Alcohol/week: 0.0 standard drinks    Comment: stopped drinking in 1998   Drug use: No     Family Hx: The patient's family history includes Cancer in his maternal grandmother; Coronary artery disease in his brother and brother; Diabetes in his brother, brother, and mother; Heart attack in his unknown relative; Heart failure in his father and mother; Hypothyroidism in his brother; Irregular heart beat in his daughter; Other in his brother; Pancreatic cancer in his brother; Stroke in his brother.  ROS:   Please see the history of present illness.    Weight gain All other systems reviewed and are negative.   Prior CV studies:   The following studies were reviewed today:  As noted above  Labs/Other Tests and Data Reviewed:    EKG:  An ECG dated February 2020 was personally reviewed today and demonstrated:  Normal sinus rhythm  Recent Labs: 11/06/2018: B Natriuretic Peptide 18.5 02/01/2019: NT-Pro BNP 96; TSH 5.060 02/05/2019: ALT 15; BUN 22; Creatinine, Ser 1.47; Hemoglobin 13.9; Platelets 178.0; Potassium 4.2; Sodium 135   Recent Lipid Panel Lab Results  Component Value Date/Time   CHOL 168 02/05/2019 09:14 AM   CHOL 99 (L) 09/27/2017 08:23 AM   TRIG 358.0 (H) 02/05/2019 09:14 AM   HDL 30.00 (L) 02/05/2019 09:14 AM   HDL 30 (L) 09/27/2017 08:23 AM   CHOLHDL 6 02/05/2019 09:14 AM   LDLCALC 32 07/17/2018 11:28 AM   LDLCALC 35 09/27/2017 08:23 AM   LDLDIRECT 107.0 02/05/2019 09:14 AM    Wt Readings from Last 3 Encounters:  03/29/19 203 lb (92.1 kg)  02/05/19 204 lb 9.6 oz (92.8 kg)  02/01/19 207 lb (93.9 kg)     Objective:    Vital Signs:  Ht 5\' 9"  (1.753 m)    Wt 203 lb (92.1 kg)    BMI 29.98 kg/m    VITAL SIGNS:  reviewed GEN:  no acute distress RESPIRATORY:  normal  respiratory effort, symmetric expansion PSYCH:  normal affect Exam limited due to video format  ASSESSMENT & PLAN:    1. CAD: No angina.  COntinue aggressive secondary prevention.   2. AFib: In NSR at last check in 01/2019.   3. Hyperlipidemia: LDL 107.  TG elevated.  Should improve with diet changes.  4. DOE: Likely related to deconditioning and weight gain.   5. Obesity: We spoke about avoiding carbohydrates.  He has been eating sweets.  His diabetes is not well controlled.  Plant-based diet recommended. 6. Anticoagulated: No bleeding problems.  We spoke about switching from warfarin to an all DOAC, but he is not interested at this time.  COVID-19 Education: The signs and symptoms of COVID-19 were discussed with the patient and how to seek care for testing (follow up with PCP or arrange E-visit).  The importance of social distancing was discussed today.  Time:   Today, I have spent 25 minutes with the patient with telehealth technology discussing the above problems.     Medication Adjustments/Labs and Tests Ordered: Current medicines are reviewed at length with the patient today.  Concerns regarding medicines are outlined above.   Tests Ordered: No orders of the defined types were placed in this encounter.   Medication Changes: No orders of the defined types were placed in this encounter.   Disposition:  Follow up in 6 month(s)  Signed, Larae Grooms, MD  03/29/2019 9:12 AM    [facility]

## 2019-03-29 NOTE — Patient Instructions (Signed)
Medication Instructions:  Your physician recommends that you continue on your current medications as directed. Please refer to the Current Medication list given to you today.  If you need a refill on your cardiac medications before your next appointment, please call your pharmacy.   Lab work: None Ordered  If you have labs (blood work) drawn today and your tests are completely normal, you will receive your results only by: Marland Kitchen MyChart Message (if you have MyChart) OR . A paper copy in the mail If you have any lab test that is abnormal or we need to change your treatment, we will call you to review the results.  Testing/Procedures: None ordered  Follow-Up: At [facility], you and your health needs are our priority.  As part of our continuing mission to provide you with exceptional heart care, we have created designated Provider Care Teams.  These Care Teams include your primary Cardiologist (physician) and Advanced Practice Providers (APPs -  Physician Assistants and Nurse Practitioners) who all work together to provide you with the care you need, when you need it. . You will need a follow up appointment in 6 months.  Please call our office 2 months in advance to schedule this appointment.  You may see Casandra Doffing, MD or one of the following Advanced Practice Providers on your designated Care Team:   . Lyda Jester, PA-C . Dayna Dunn, PA-C . Ermalinda Barrios, PA-C  Any Other Special Instructions Will Be Listed Below (If Applicable).   Carbohydrate Counting for Diabetes Mellitus, Adult  Carbohydrate counting is a method of keeping track of how many carbohydrates you eat. Eating carbohydrates naturally increases the amount of sugar (glucose) in the blood. Counting how many carbohydrates you eat helps keep your blood glucose within normal limits, which helps you manage your diabetes (diabetes mellitus). It is important to know how many carbohydrates you can safely have in each meal. This  is different for every person. A diet and nutrition specialist (registered dietitian) can help you make a meal plan and calculate how many carbohydrates you should have at each meal and snack. Carbohydrates are found in the following foods:  Grains, such as breads and cereals.  Dried beans and soy products.  Starchy vegetables, such as potatoes, peas, and corn.  Fruit and fruit juices.  Milk and yogurt.  Sweets and snack foods, such as cake, cookies, candy, chips, and soft drinks. How do I count carbohydrates? There are two ways to count carbohydrates in food. You can use either of the methods or a combination of both. Reading "Nutrition Facts" on packaged food The "Nutrition Facts" list is included on the labels of almost all packaged foods and beverages in the [place] It includes:  The serving size.  Information about nutrients in each serving, including the grams (g) of carbohydrate per serving. To use the "Nutrition Facts":  Decide how many servings you will have.  Multiply the number of servings by the number of carbohydrates per serving.  The resulting number is the total amount of carbohydrates that you will be having. Learning standard serving sizes of other foods When you eat carbohydrate foods that are not packaged or do not include "Nutrition Facts" on the label, you need to measure the servings in order to count the amount of carbohydrates:  Measure the foods that you will eat with a food scale or measuring cup, if needed.  Decide how many standard-size servings you will eat.  Multiply the number of servings by 15. Most  carbohydrate-rich foods have about 15 g of carbohydrates per serving. ? For example, if you eat 8 oz (170 g) of strawberries, you will have eaten 2 servings and 30 g of carbohydrates (2 servings x 15 g = 30 g).  For foods that have more than one food mixed, such as soups and casseroles, you must count the carbohydrates in each food that is included.  The following list contains standard serving sizes of common carbohydrate-rich foods. Each of these servings has about 15 g of carbohydrates:   hamburger bun or  English muffin.   oz (15 mL) syrup.   oz (14 g) jelly.  1 slice of bread.  1 six-inch tortilla.  3 oz (85 g) cooked rice or pasta.  4 oz (113 g) cooked dried beans.  4 oz (113 g) starchy vegetable, such as peas, corn, or potatoes.  4 oz (113 g) hot cereal.  4 oz (113 g) mashed potatoes or  of a large baked potato.  4 oz (113 g) canned or frozen fruit.  4 oz (120 mL) fruit juice.  4-6 crackers.  6 chicken nuggets.  6 oz (170 g) unsweetened dry cereal.  6 oz (170 g) plain fat-free yogurt or yogurt sweetened with artificial sweeteners.  8 oz (240 mL) milk.  8 oz (170 g) fresh fruit or one small piece of fruit.  24 oz (680 g) popped popcorn. Example of carbohydrate counting Sample meal  3 oz (85 g) chicken breast.  6 oz (170 g) brown rice.  4 oz (113 g) corn.  8 oz (240 mL) milk.  8 oz (170 g) strawberries with sugar-free whipped topping. Carbohydrate calculation 1. Identify the foods that contain carbohydrates: ? Rice. ? Corn. ? Milk. ? Strawberries. 2. Calculate how many servings you have of each food: ? 2 servings rice. ? 1 serving corn. ? 1 serving milk. ? 1 serving strawberries. 3. Multiply each number of servings by 15 g: ? 2 servings rice x 15 g = 30 g. ? 1 serving corn x 15 g = 15 g. ? 1 serving milk x 15 g = 15 g. ? 1 serving strawberries x 15 g = 15 g. 4. Add together all of the amounts to find the total grams of carbohydrates eaten: ? 30 g + 15 g + 15 g + 15 g = 75 g of carbohydrates total. Summary  Carbohydrate counting is a method of keeping track of how many carbohydrates you eat.  Eating carbohydrates naturally increases the amount of sugar (glucose) in the blood.  Counting how many carbohydrates you eat helps keep your blood glucose within normal limits, which helps  you manage your diabetes.  A diet and nutrition specialist (registered dietitian) can help you make a meal plan and calculate how many carbohydrates you should have at each meal and snack. This information is not intended to replace advice given to you by your health care provider. Make sure you discuss any questions you have with your health care provider. Document Released: 11/22/2005 Document Revised: 06/01/2017 Document Reviewed: 05/05/2016 Elsevier Interactive Patient Education  2019 [employer].

## 2019-03-30 ENCOUNTER — Ambulatory Visit: Payer: Medicare PPO | Admitting: Endocrinology

## 2019-04-02 ENCOUNTER — Other Ambulatory Visit: Payer: Self-pay

## 2019-04-02 ENCOUNTER — Ambulatory Visit (INDEPENDENT_AMBULATORY_CARE_PROVIDER_SITE_OTHER): Payer: Medicare PPO | Admitting: Pharmacist

## 2019-04-02 DIAGNOSIS — I4821 Permanent atrial fibrillation: Secondary | ICD-10-CM | POA: Diagnosis not present

## 2019-04-02 DIAGNOSIS — I480 Paroxysmal atrial fibrillation: Secondary | ICD-10-CM | POA: Diagnosis not present

## 2019-04-02 DIAGNOSIS — Z5181 Encounter for therapeutic drug level monitoring: Secondary | ICD-10-CM

## 2019-04-02 DIAGNOSIS — Z7901 Long term (current) use of anticoagulants: Secondary | ICD-10-CM

## 2019-04-02 LAB — POCT INR: INR: 3.5 — AB (ref 2.0–3.0)

## 2019-04-03 ENCOUNTER — Other Ambulatory Visit: Payer: Self-pay | Admitting: Family Medicine

## 2019-04-03 ENCOUNTER — Institutional Professional Consult (permissible substitution): Payer: Medicare PPO | Admitting: Internal Medicine

## 2019-04-03 ENCOUNTER — Other Ambulatory Visit: Payer: Self-pay | Admitting: Cardiology

## 2019-04-03 ENCOUNTER — Encounter: Payer: Self-pay | Admitting: Endocrinology

## 2019-04-03 MED FILL — FUROSEMIDE 20 MG TAB: 20 | 30 days supply | Qty: 30 | Fill #1

## 2019-04-03 MED FILL — SERTRALINE HCL 100 MG TAB: 100 | 90 days supply | Qty: 180 | Fill #1

## 2019-04-03 MED FILL — NOVOLOG FLEXPEN SYRINGE: 100 | 8 days supply | Qty: 15 | Fill #1

## 2019-04-04 ENCOUNTER — Ambulatory Visit (INDEPENDENT_AMBULATORY_CARE_PROVIDER_SITE_OTHER): Payer: Medicare PPO | Admitting: Endocrinology

## 2019-04-04 ENCOUNTER — Other Ambulatory Visit: Payer: Self-pay

## 2019-04-04 ENCOUNTER — Institutional Professional Consult (permissible substitution): Payer: Medicare PPO | Admitting: Internal Medicine

## 2019-04-04 DIAGNOSIS — E1165 Type 2 diabetes mellitus with hyperglycemia: Secondary | ICD-10-CM | POA: Diagnosis not present

## 2019-04-04 MED ORDER — INSULIN NPH (HUMAN) (ISOPHANE) 100 UNIT/ML ~~LOC~~ SUSP: 72.0000 [IU] | Freq: Every day | SUBCUTANEOUS | 0 refills | Status: DC

## 2019-04-04 MED ORDER — INSULIN ASPART 100 UNIT/ML FLEXPEN: PEN_INJECTOR | SUBCUTANEOUS | 0 refills | Status: DC

## 2019-04-04 NOTE — Progress Notes (Signed)
Subjective:    Patient ID: [name] [name], male    DOB: 1946-11-16, 73 y.o.   MRN: [id number]  HPI telehealth visit today via doxy video visit.  Alternatives to telehealth are presented to this patient, and the patient agrees to the telehealth visit. Pt is advised of the cost of the visit, and agrees to this, also.   Patient is at home, and I am at the office.   Persons attending the telehealth visit: the patient and I Pt returns for f/u of diabetes mellitus:  DM type: Insulin-requiring type 2, although lean body habitus raises the possibility he is evolving type 1 Dx'ed: 6967 Complications: polyneuropathy, renal insuff, CAD and PAD Therapy: insulin since 2008 DKA: never Severe hypoglycemia: never Pancreatitis: never Pancreatic imaging: 2017 CT showed fatty replacement of the pancreatic head and body. Other: he takes multiple daily injections; he takes human insulin at HS, due to cost. Interval history: pt says cbg's are mostly in the high-100's.  It is in general highest at lunch, and also lowest then.  He seldom has hypoglycemia, and these episodes are mild.  He says he never misses the insulin.  Main symptom is doe.   Past Medical History:  Diagnosis Date  . Acquired atrophy of thyroid 06/05/2013   Overview:  July 2014: controlled on synthroid 68mcg since 2012 May 2015: decreased to Synthroid 50 mcg  Last Assessment & Plan:  Pt doing well with most recent TSH within normal range.  Will continue current dosage of Synthroid 35mcg. Pt reminded to take medication on empty stomach. Will continue to monitor with periodic laboratory assessment in 3 months.   . Anemia    hemoglobin 7.4, iron deficiency, January, 2011, 2 unit transfusion, endoscopy normal, capsule endoscopy February, 2011 no small bowel abnormalities.   Most likely source gastric erosions, followed by GI  . Anxiety   . Bradycardia   . CAD (coronary artery disease)    A. CABG in 2000,status post cardiac cath in 2006, 2009  ....continued chest pain and SOB despite oral medication adjestments including Ranexa. B. Cath November 2009/ mRCA - 2.75 x 23 Abbott Xience V drug-eluting stent ...11/26/2008 to distal  RCA leading to acute marginal.  C. Cath 07/2012 for CP - stable anatomy, med rx. d. cath 2015 and 05/30/2015 stable anatomy, consider Myoview if has CP again  . Carotid artery disease ([place]) 08/01/2015   Doppler, May 29, 2015, 1-39% bilateral ICA   . Cerebral ischemia    MRI November, 2010, chronic microvascular ischemia  . CKD (chronic kidney disease), stage III ([place])   . Concussion   . Depression    Bipolar  . Diabetes mellitus ([place])   . Edema   . Essential hypertension 06/05/2013   Overview:  July 2014: Controlled with Bystolic Sep 8938: Changed to atenolol.  Last Assessment & Plan:  Will change to atenolol for cost and follow Improved.  Medication compliance strongly encouraged BP: 116/64 mmHg    Overview:  July 2014: Controlled with Bystolic Sep 1017: Changed to atenolol.  Last Assessment & Plan:  Will change to atenolol for cost and follow  . Falling episodes    these have occurred in the past and again recurring 2011  . Family history of adverse reaction to anesthesia    "mother died during bypass surgery but not sure if it has to do with anesthesia"  . Gastric ulcer   . GERD (gastroesophageal reflux disease)   . H/O medication noncompliance    Due to  loss of insurance  . H/O multiple concussions   . Hard of hearing   . History of blood transfusion 12/20/2013  . Hx of CABG    2000,  / one median sternotomy suture broken her chest x-ray November, 2010, no clinical significance  . Hyperlipidemia   . Hypertension    pt. denies  . Iron deficiency anemia   . Long term (current) use of anticoagulants [Z79.01] 07/20/2016  . Low back pain 06/12/2009   Qualifier: Diagnosis of  By: [name]   . Nephrolithiasis   . Orthostasis   . OSA (obstructive sleep apnea)   . PAF (paroxysmal atrial fibrillation)  ([place])    a. dx 2017, started on amiodarone/warfarin but patient intermittently noncompliant.  Marland Kitchen PSVT (paroxysmal supraventricular tachycardia) ([place])   . RBBB 07/09/2009   Qualifier: Diagnosis of  By: [name], MD, [name] [name]   . RLS (restless legs syndrome) 09/19/2009   Qualifier: Diagnosis of  By: [name]   Overview:  July 2014: Controlled with Mirapex  Last Assessment & Plan:  Patient is doing well. Will continue current management and follow clinically.  . Seizure disorder ([place]) 01/09/2017  . Spondylosis    C5-6, C6-7 MRI 2010  . Syncope 03/2016  . Thyroid disease   . Tubulovillous adenoma of colon 2007  . Vitamin D deficiency 05/17/2017  . Wears glasses     Past Surgical History:  Procedure Laterality Date  . ANTERIOR CERVICAL DECOMP/DISCECTOMY FUSION N/A 05/31/2016   Procedure: ANTERIOR CERVICAL DECOMPRESSION/DISCECTOMY FUSION CERVICAL FIVE-SIX,CERVICAL SIX-SEVEN;  Surgeon: [name], MD;  Location: [facility];  Service: Neurosurgery;  Laterality: N/A;  . CARDIAC CATHETERIZATION N/A 05/30/2015   Procedure: Left Heart Cath and Coronary Angiography;  Surgeon: [name], MD;  Location: [facility];  Service: Cardiovascular;  Laterality: N/A;  . COLONOSCOPY    . CORONARY ARTERY BYPASS GRAFT     2000  . ESOPHAGOGASTRODUODENOSCOPY    . LEFT HEART CATH AND CORS/GRAFTS ANGIOGRAPHY N/A 12/15/2017   Procedure: LEFT HEART CATH AND CORS/GRAFTS ANGIOGRAPHY;  Surgeon: [name], MD;  Location: [place] [facility];  Service: Cardiovascular;  Laterality: N/A;  . LEFT HEART CATHETERIZATION WITH CORONARY/GRAFT ANGIOGRAM N/A 08/01/2012   Procedure: LEFT HEART CATHETERIZATION WITH Beatrix Fetters;  Surgeon: [name], MD;  Location: [facility] CATH LAB;  Service: Cardiovascular;  Laterality: N/A;  . LEFT HEART CATHETERIZATION WITH CORONARY/GRAFT ANGIOGRAM N/A 01/03/2015   Procedure: LEFT HEART CATHETERIZATION WITH Beatrix Fetters;  Surgeon: [name], MD;  Location: [facility] CATH LAB;  Service: Cardiovascular;  Laterality: N/A;  . NASAL SEPTUM SURGERY     UP3  . PERCUTANEOUS CORONARY STENT INTERVENTION (PCI-S)  10/2008   mRCA PCI  2.75 x 23 Abbott Xience V drug-eluting stent   . ULTRASOUND GUIDANCE FOR VASCULAR ACCESS  12/15/2017   Procedure: Ultrasound Guidance For Vascular Access;  Surgeon: [name], MD;  Location: [place] [facility];  Service: Cardiovascular;;    Social History   Socioeconomic History  . Marital status: Married    Spouse name: Not on file  . Number of children: 4  . Years of education: 81  . Highest education level: Not on file  Occupational History  . Occupation: retired  Scientific laboratory technician  . Financial resource strain: Not on file  . Food insecurity:    Worry: Not on file    Inability: Not on file  . Transportation needs:    Medical: Not  on file    Non-medical: Not on file  Tobacco Use  . Smoking status: Never Smoker  . Smokeless tobacco: Never Used  Substance and Sexual Activity  . Alcohol use: No    Alcohol/week: 0.0 standard drinks    Comment: stopped drinking in 1998  . Drug use: No  . Sexual activity: Not on file  Lifestyle  . Physical activity:    Days per week: Not on file    Minutes per session: Not on file  . Stress: Not on file  Relationships  . Social connections:    Talks on phone: Not on file    Gets together: Not on file    Attends religious service: Not on file    Active member of club or organization: Not on file    Attends meetings of clubs or organizations: Not on file    Relationship status: Not on file  . Intimate partner violence:    Fear of current or ex partner: Not on file    Emotionally abused: Not on file    Physically abused: Not on file    Forced sexual activity: Not on file  Other Topics Concern  . Not on file  Social History Narrative   Patient is right handed.   Patient does not drink caffeine.    Current Outpatient Medications on File Prior to  Visit  Medication Sig Dispense Refill  . atorvastatin (LIPITOR) 80 MG tablet Take 1 tablet (80 mg total) by mouth daily. 90 tablet 3  . diclofenac sodium (VOLTAREN) 1 % GEL Apply 2 g topically daily as needed (for pain in affected area). 2 Tube 0  . divalproex (DEPAKOTE ER) 250 MG 24 hr tablet Take 3 tablets (750 mg total) by mouth at bedtime. 180 tablet 0  . furosemide (LASIX) 20 MG tablet Take 1 tablet (20 mg total) by mouth daily. 30 tablet 0  . glucose blood (ACCU-CHEK AVIVA) test strip Use as directed to check blood sugar 4 times daily.  DX E11.9 200 each 6  . isosorbide mononitrate (IMDUR) 60 MG 24 hr tablet Take 2 tablets (120 mg total) by mouth daily. 180 tablet 0  . levothyroxine (SYNTHROID, LEVOTHROID) 100 MCG tablet Take 1 tablet (100 mcg total) by mouth daily. 90 tablet 1  . loperamide (IMODIUM A-D) 2 MG tablet Take 1 tablet (2 mg total) by mouth 4 (four) times daily as needed for diarrhea or loose stools. 30 tablet 0  . metoprolol succinate (TOPROL-XL) 50 MG 24 hr tablet TAKE 1&1/2 TABLETS BY MOUTH ONCE A DAY FOR A TOTAL OF 75 MG. 135 tablet 3  . midodrine (PROAMATINE) 10 MG tablet Take 20 mg by mouth daily.    . nitroGLYCERIN (NITROSTAT) 0.4 MG SL tablet Place 1 tablet (0.4 mg total) under the tongue every 5 (five) minutes as needed for chest pain. 25 tablet 1  . pantoprazole (PROTONIX) 40 MG tablet Take 1 tablet (40 mg total) by mouth daily. 90 tablet 0  . pramipexole (MIRAPEX) 1 MG tablet Take 0.5-1 tablets (0.5-1 mg total) by mouth at bedtime. 90 tablet 0  . sertraline (ZOLOFT) 100 MG tablet TAKE 1 TABLET (100 MG TOTAL) BY MOUTH 2 (TWO) TIMES DAILY. 180 tablet 1  . tiZANidine (ZANAFLEX) 4 MG tablet Take 1 tablet (4 mg total) by mouth every 6 (six) hours as needed for muscle spasms. 40 tablet 2  . Vitamin D, Ergocalciferol, (DRISDOL) 1.25 MG (50000 UT) CAPS capsule Take 1 capsule (50,000 Units total) by mouth every  7 (seven) days. 20 capsule 0  . warfarin (COUMADIN) 5 MG tablet Take  1 to 1.5 tablets daily as directed by Coumadin clinic 40 tablet 3  . ranolazine (RANEXA) 500 MG 12 hr tablet TAKE 1 TABLET (500 MG TOTAL) BY MOUTH 2 (TWO) TIMES DAILY. 60 tablet 0   No current facility-administered medications on file prior to visit.     Allergies  Allergen Reactions  . Gabapentin Rash  . Morphine Other (See Comments)    hallucinations  . Shellfish-Derived Products Itching    Family History  Problem Relation Age of Onset  . Pancreatic cancer Brother   . Diabetes Brother   . Coronary artery disease Brother   . Stroke Brother   . Diabetes Brother   . Diabetes Mother   . Heart failure Mother   . Heart failure Father   . Hypothyroidism Brother   . Coronary artery disease Brother   . Other Brother        colon surgery  . Heart attack Unknown        Nephew  . Irregular heart beat Daughter   . Cancer Maternal Grandmother        unknown     Review of Systems He denies hypoglycemia    Objective:   Physical Exam  Lab Results  Component Value Date   CREATININE 1.47 02/05/2019   BUN 22 02/05/2019   NA 135 02/05/2019   K 4.2 02/05/2019   CL 99 02/05/2019   CO2 28 02/05/2019      Assessment & Plan:  Insulin-requiring type 2 DM, with PAD: The pattern of his cbg's indicates he needs some adjustment in his therapy.  Hypoglycemia: This limits aggressiveness of glycemic control.  As we are making multiple adjustments in insulins, we'll reduce total daily # of units.  Renal insuff: in this setting, he needs mostly novolog.    Patient Instructions  check your blood sugar 4 times a day.  vary the time of day when you check, between before the 3 meals, and at bedtime.  also check if you have symptoms of your blood sugar being too high or too low.  please keep a record of the readings and bring it to your next appointment here (or you can bring the meter itself).  You can write it on any piece of paper.  please call us sooner if your blood sugar goes below 70, or if  you have a lot of readings over 200. Please reduce the NPH to bedtime only, and: Please increase the novolog to 60-80 with breakfast, 65 with lunch, and 85 with supper.   Please come back for a follow-up appointment in 2 months.

## 2019-04-04 NOTE — Patient Instructions (Addendum)
check your blood sugar 4 times a day.  vary the time of day when you check, between before the 3 meals, and at bedtime.  also check if you have symptoms of your blood sugar being too high or too low.  please keep a record of the readings and bring it to your next appointment here (or you can bring the meter itself).  You can write it on any piece of paper.  please call us sooner if your blood sugar goes below 70, or if you have a lot of readings over 200. Please reduce the NPH to bedtime only, and: Please increase the novolog to 60-80 with breakfast, 65 with lunch, and 85 with supper.   Please come back for a follow-up appointment in 2 months.

## 2019-04-05 ENCOUNTER — Telehealth: Payer: Self-pay | Admitting: Neurology

## 2019-04-05 ENCOUNTER — Telehealth: Payer: Self-pay

## 2019-04-05 MED FILL — RANOLAZINE ER 500 MG TABLET: 500 | 30 days supply | Qty: 60 | Fill #0

## 2019-04-05 NOTE — Telephone Encounter (Signed)
I contacted the pt. He states the appt of 04/09/19 at 9 am would not work for him due to a conflict with PCP. Pt has been moved to the 3 pm slot on 04/09/19. Link has been submitted.

## 2019-04-05 NOTE — Telephone Encounter (Signed)
[email or web address] link has been sent via text to mobile # [phone] .

## 2019-04-05 NOTE — Telephone Encounter (Signed)
Pt called in and stated he has a doctors appt on 04/09/2019 at 9:30 and wants to know if the tie or date can be changed for his appt at [facility]

## 2019-04-05 NOTE — Telephone Encounter (Signed)
Due to current COVID 19 pandemic, our office is severely reducing in office visits for at least the next 2 weeks, in order to minimize the risk to our patients and healthcare providers. Pt understands that although there may be some limitations with this type of visit, we will take all precautions to reduce any security or privacy concerns.  Pt understands that this will be treated like an in office visit and we will file with pt's insurance, and there may be a patient responsible charge related to this service.  Pt requesting a text with [email or web address] link at [phone]

## 2019-04-05 NOTE — Telephone Encounter (Signed)
No I did not call him

## 2019-04-05 NOTE — Telephone Encounter (Signed)
Copied from [place] [phone]. Topic: General - Other >> Apr 04, 2019  5:39 PM Nils Flack, Marland Kitchen wrote: Reason for CRM: pt missed call  Please call back  Thanks

## 2019-04-09 ENCOUNTER — Institutional Professional Consult (permissible substitution): Payer: Medicare PPO | Admitting: Neurology

## 2019-04-09 ENCOUNTER — Ambulatory Visit (INDEPENDENT_AMBULATORY_CARE_PROVIDER_SITE_OTHER): Payer: Medicare PPO | Admitting: Family Medicine

## 2019-04-09 ENCOUNTER — Ambulatory Visit (INDEPENDENT_AMBULATORY_CARE_PROVIDER_SITE_OTHER): Payer: Medicare PPO | Admitting: Neurology

## 2019-04-09 ENCOUNTER — Other Ambulatory Visit: Payer: Self-pay

## 2019-04-09 ENCOUNTER — Encounter: Payer: Self-pay | Admitting: Neurology

## 2019-04-09 DIAGNOSIS — I10 Essential (primary) hypertension: Secondary | ICD-10-CM

## 2019-04-09 DIAGNOSIS — K921 Melena: Secondary | ICD-10-CM | POA: Insufficient documentation

## 2019-04-09 DIAGNOSIS — E034 Atrophy of thyroid (acquired): Secondary | ICD-10-CM

## 2019-04-09 DIAGNOSIS — G40909 Epilepsy, unspecified, not intractable, without status epilepticus: Secondary | ICD-10-CM

## 2019-04-09 DIAGNOSIS — N183 Chronic kidney disease, stage 3 unspecified: Secondary | ICD-10-CM

## 2019-04-09 DIAGNOSIS — E1165 Type 2 diabetes mellitus with hyperglycemia: Secondary | ICD-10-CM | POA: Diagnosis not present

## 2019-04-09 DIAGNOSIS — Z7901 Long term (current) use of anticoagulants: Secondary | ICD-10-CM

## 2019-04-09 DIAGNOSIS — R51 Headache: Secondary | ICD-10-CM | POA: Diagnosis not present

## 2019-04-09 DIAGNOSIS — G4486 Cervicogenic headache: Secondary | ICD-10-CM

## 2019-04-09 MED ORDER — GABAPENTIN 300 MG PO CAPS: 300.0000 mg | ORAL_CAPSULE | Freq: Two times a day (BID) | ORAL | 1 refills | Status: DC

## 2019-04-09 MED FILL — GABAPENTIN 300 MG CAPSULE: 300 | 45 days supply | Qty: 90 | Fill #0

## 2019-04-09 NOTE — Assessment & Plan Note (Signed)
no changes to meds. Encouraged heart healthy diet such as the DASH diet and exercise as tolerated.  

## 2019-04-09 NOTE — Assessment & Plan Note (Signed)
hgba1c acceptable, minimize simple carbs. Increase exercise as tolerated. Continue current meds. He had dropped his NPH down to 60 units but he notes his sugar jumped up as high as 400 so he increased his NPH back up to 72. His lowest sugar since then his lowest sugars has been 75, one high at 400 after over eating most numbers around 200. Novolog 65 units breakfast and lunch, 80 units supper NPH 68 units down from 72

## 2019-04-09 NOTE — Progress Notes (Signed)
Virtual Visit via Video Note  I connected with [name] [name] on 04/09/19 at  9:00 AM EDT by a video enabled telemedicine application and verified that I am speaking with the correct person using two identifiers.  Location: Patient: home Provider: office   I discussed the limitations of evaluation and management by telemedicine and the availability of in person appointments. The patient expressed understanding and agreed to proceed. Magdalene Molly, CMA was able to get patient set up on video visit.      Subjective:    Patient ID: [name] [name], male    DOB: 23-Mar-1946, 73 y.o.   MRN: [id number]  No chief complaint on file.   HPI Patient is in today for follow up on hypertension, diabetes, hypothyroidism and more. He is noting an episode of black stool which has not recurred. No abdominal pain or other bowel changes. No fevers or chiclls. INR has been running slightly high. He is monitoring with cardiology. His sugars have been labile. Marland Kitchen He had dropped his NPH down to 60 units but he notes his sugar jumped up as high as 400 so he increased his NPH back up to 72. His lowest sugar since then his lowest sugars has been 75, one high at 400 after over eating most numbers around 200. Novolog. Denies CP/palp/SOB/HA/congestion/fevers/GI or GU c/o. Taking meds as prescribed  Past Medical History:  Diagnosis Date  . Acquired atrophy of thyroid 06/05/2013   Overview:  July 2014: controlled on synthroid 65mcg since 2012 May 2015: decreased to Synthroid 50 mcg  Last Assessment & Plan:  Pt doing well with most recent TSH within normal range.  Will continue current dosage of Synthroid 35mcg. Pt reminded to take medication on empty stomach. Will continue to monitor with periodic laboratory assessment in 3 months.   . Anemia    hemoglobin 7.4, iron deficiency, January, 2011, 2 unit transfusion, endoscopy normal, capsule endoscopy February, 2011 no small bowel abnormalities.   Most likely source gastric  erosions, followed by GI  . Anxiety   . Bradycardia   . CAD (coronary artery disease)    A. CABG in 2000,status post cardiac cath in 2006, 2009 ....continued chest pain and SOB despite oral medication adjestments including Ranexa. B. Cath November 2009/ mRCA - 2.75 x 23 Abbott Xience V drug-eluting stent ...11/26/2008 to distal  RCA leading to acute marginal.  C. Cath 07/2012 for CP - stable anatomy, med rx. d. cath 2015 and 05/30/2015 stable anatomy, consider Myoview if has CP again  . Carotid artery disease ([place]) 08/01/2015   Doppler, May 29, 2015, 1-39% bilateral ICA   . Cerebral ischemia    MRI November, 2010, chronic microvascular ischemia  . CKD (chronic kidney disease), stage III ([place])   . Concussion   . Depression    Bipolar  . Diabetes mellitus ([place])   . Edema   . Essential hypertension 06/05/2013   Overview:  July 2014: Controlled with Bystolic Sep 2841: Changed to atenolol.  Last Assessment & Plan:  Will change to atenolol for cost and follow Improved.  Medication compliance strongly encouraged BP: 116/64 mmHg    Overview:  July 2014: Controlled with Bystolic Sep 3244: Changed to atenolol.  Last Assessment & Plan:  Will change to atenolol for cost and follow  . Falling episodes    these have occurred in the past and again recurring 2011  . Family history of adverse reaction to anesthesia    "mother died during bypass surgery but not sure  if it has to do with anesthesia"  . Gastric ulcer   . GERD (gastroesophageal reflux disease)   . H/O medication noncompliance    Due to loss of insurance  . H/O multiple concussions   . Hard of hearing   . History of blood transfusion 12/20/2013  . Hx of CABG    2000,  / one median sternotomy suture broken her chest x-ray November, 2010, no clinical significance  . Hyperlipidemia   . Hypertension    pt. denies  . Iron deficiency anemia   . Long term (current) use of anticoagulants [Z79.01] 07/20/2016  . Low back pain 06/12/2009   Qualifier:  Diagnosis of  By: [name]   . Nephrolithiasis   . Orthostasis   . OSA (obstructive sleep apnea)   . PAF (paroxysmal atrial fibrillation) ([place])    a. dx 2017, started on amiodarone/warfarin but patient intermittently noncompliant.  Marland Kitchen PSVT (paroxysmal supraventricular tachycardia) ([place])   . RBBB 07/09/2009   Qualifier: Diagnosis of  By: [name], MD, [name] [name]   . RLS (restless legs syndrome) 09/19/2009   Qualifier: Diagnosis of  By: [name]   Overview:  July 2014: Controlled with Mirapex  Last Assessment & Plan:  Patient is doing well. Will continue current management and follow clinically.  . Seizure disorder ([place]) 01/09/2017  . Spondylosis    C5-6, C6-7 MRI 2010  . Syncope 03/2016  . Thyroid disease   . Tubulovillous adenoma of colon 2007  . Vitamin D deficiency 05/17/2017  . Wears glasses     Past Surgical History:  Procedure Laterality Date  . ANTERIOR CERVICAL DECOMP/DISCECTOMY FUSION N/A 05/31/2016   Procedure: ANTERIOR CERVICAL DECOMPRESSION/DISCECTOMY FUSION CERVICAL FIVE-SIX,CERVICAL SIX-SEVEN;  Surgeon: [name], MD;  Location: [facility];  Service: Neurosurgery;  Laterality: N/A;  . CARDIAC CATHETERIZATION N/A 05/30/2015   Procedure: Left Heart Cath and Coronary Angiography;  Surgeon: [name], MD;  Location: [facility];  Service: Cardiovascular;  Laterality: N/A;  . COLONOSCOPY    . CORONARY ARTERY BYPASS GRAFT     2000  . ESOPHAGOGASTRODUODENOSCOPY    . LEFT HEART CATH AND CORS/GRAFTS ANGIOGRAPHY N/A 12/15/2017   Procedure: LEFT HEART CATH AND CORS/GRAFTS ANGIOGRAPHY;  Surgeon: [name], MD;  [facility];  Service: Cardiovascular;  Laterality: N/A;  . LEFT HEART CATHETERIZATION WITH CORONARY/GRAFT ANGIOGRAM N/A 08/01/2012   Procedure: LEFT HEART CATHETERIZATION WITH Beatrix Fetters;  Surgeon: [name], MD;  Location: [facility];  Service: Cardiovascular;  Laterality: N/A;  . LEFT HEART  CATHETERIZATION WITH CORONARY/GRAFT ANGIOGRAM N/A 01/03/2015   Procedure: LEFT HEART CATHETERIZATION WITH Beatrix Fetters;  Surgeon: [name], MD;  Location: [facility] CATH LAB;  Service: Cardiovascular;  Laterality: N/A;  . NASAL SEPTUM SURGERY     UP3  . PERCUTANEOUS CORONARY STENT INTERVENTION (PCI-S)  10/2008   mRCA PCI  2.75 x 23 Abbott Xience V drug-eluting stent   . ULTRASOUND GUIDANCE FOR VASCULAR ACCESS  12/15/2017   Procedure: Ultrasound Guidance For Vascular Access;  Surgeon: [name], MD;  Location: [facility];  Service: Cardiovascular;;    Family History  Problem Relation Age of Onset  . Pancreatic cancer Brother   . Diabetes Brother   . Coronary artery disease Brother   . Stroke Brother   . Diabetes Brother   . Diabetes Mother   . Heart failure Mother   . Heart failure Father   .  Hypothyroidism Brother   . Coronary artery disease Brother   . Other Brother        colon surgery  . Heart attack Other        Nephew  . Irregular heart beat Daughter   . Cancer Maternal Grandmother        unknown     Social History   Socioeconomic History  . Marital status: Married    Spouse name: Not on file  . Number of children: 4  . Years of education: 63  . Highest education level: Not on file  Occupational History  . Occupation: retired  Scientific laboratory technician  . Financial resource strain: Not on file  . Food insecurity:    Worry: Not on file    Inability: Not on file  . Transportation needs:    Medical: Not on file    Non-medical: Not on file  Tobacco Use  . Smoking status: Never Smoker  . Smokeless tobacco: Never Used  Substance and Sexual Activity  . Alcohol use: No    Alcohol/week: 0.0 standard drinks    Comment: stopped drinking in 1998  . Drug use: No  . Sexual activity: Not on file  Lifestyle  . Physical activity:    Days per week: Not on file    Minutes per session: Not on file  . Stress: Not on file  Relationships  . Social  connections:    Talks on phone: Not on file    Gets together: Not on file    Attends religious service: Not on file    Active member of club or organization: Not on file    Attends meetings of clubs or organizations: Not on file    Relationship status: Not on file  . Intimate partner violence:    Fear of current or ex partner: Not on file    Emotionally abused: Not on file    Physically abused: Not on file    Forced sexual activity: Not on file  Other Topics Concern  . Not on file  Social History Narrative   Patient is right handed.   Patient does not drink caffeine.    Outpatient Medications Prior to Visit  Medication Sig Dispense Refill  . atorvastatin (LIPITOR) 80 MG tablet Take 1 tablet (80 mg total) by mouth daily. 90 tablet 3  . diclofenac sodium (VOLTAREN) 1 % GEL Apply 2 g topically daily as needed (for pain in affected area). 2 Tube 0  . divalproex (DEPAKOTE ER) 250 MG 24 hr tablet Take 3 tablets (750 mg total) by mouth at bedtime. 180 tablet 0  . furosemide (LASIX) 20 MG tablet Take 1 tablet (20 mg total) by mouth daily. 30 tablet 0  . gabapentin (NEURONTIN) 300 MG capsule Take 1 capsule (300 mg total) by mouth 2 (two) times daily. 90 capsule 1  . glucose blood (ACCU-CHEK AVIVA) test strip Use as directed to check blood sugar 4 times daily.  DX E11.9 200 each 6  . insulin aspart (NOVOLOG FLEXPEN) 100 UNIT/ML FlexPen 60-80 with breakfast, 65 with lunch, and 85 with supper, and pen needles 3/day 20 pen 0  . insulin NPH Human (NOVOLIN N) 100 UNIT/ML injection Inject 0.72 mLs (72 Units total) into the skin at bedtime. And syringes 1/day 130 mL 0  . isosorbide mononitrate (IMDUR) 60 MG 24 hr tablet Take 2 tablets (120 mg total) by mouth daily. 180 tablet 0  . levothyroxine (SYNTHROID, LEVOTHROID) 100 MCG tablet Take 1 tablet (100 mcg total)  by mouth daily. 90 tablet 1  . loperamide (IMODIUM A-D) 2 MG tablet Take 1 tablet (2 mg total) by mouth 4 (four) times daily as needed for  diarrhea or loose stools. 30 tablet 0  . metoprolol succinate (TOPROL-XL) 50 MG 24 hr tablet TAKE 1&1/2 TABLETS BY MOUTH ONCE A DAY FOR A TOTAL OF 75 MG. 135 tablet 3  . midodrine (PROAMATINE) 10 MG tablet Take 20 mg by mouth daily.    . nitroGLYCERIN (NITROSTAT) 0.4 MG SL tablet Place 1 tablet (0.4 mg total) under the tongue every 5 (five) minutes as needed for chest pain. 25 tablet 1  . pantoprazole (PROTONIX) 40 MG tablet Take 1 tablet (40 mg total) by mouth daily. 90 tablet 0  . pramipexole (MIRAPEX) 1 MG tablet Take 0.5-1 tablets (0.5-1 mg total) by mouth at bedtime. 90 tablet 0  . ranolazine (RANEXA) 500 MG 12 hr tablet TAKE 1 TABLET (500 MG TOTAL) BY MOUTH 2 (TWO) TIMES DAILY. 60 tablet 0  . sertraline (ZOLOFT) 100 MG tablet TAKE 1 TABLET (100 MG TOTAL) BY MOUTH 2 (TWO) TIMES DAILY. 180 tablet 1  . tiZANidine (ZANAFLEX) 4 MG tablet Take 1 tablet (4 mg total) by mouth every 6 (six) hours as needed for muscle spasms. 40 tablet 2  . Vitamin D, Ergocalciferol, (DRISDOL) 1.25 MG (50000 UT) CAPS capsule Take 1 capsule (50,000 Units total) by mouth every 7 (seven) days. 20 capsule 0  . warfarin (COUMADIN) 5 MG tablet Take 1 to 1.5 tablets daily as directed by Coumadin clinic 40 tablet 3   No facility-administered medications prior to visit.     Allergies  Allergen Reactions  . Morphine Other (See Comments)    hallucinations  . Shellfish-Derived Products Itching    Review of Systems  Constitutional: Positive for malaise/fatigue. Negative for fever.  HENT: Negative for congestion.   Eyes: Negative for blurred vision.  Respiratory: Negative for shortness of breath.   Cardiovascular: Negative for chest pain, palpitations and leg swelling.  Gastrointestinal: Negative for abdominal pain, blood in stool and nausea.  Genitourinary: Positive for frequency. Negative for dysuria.  Musculoskeletal: Negative for falls.  Skin: Negative for rash.  Neurological: Negative for dizziness, loss of  consciousness and headaches.  Endo/Heme/Allergies: Negative for environmental allergies.  Psychiatric/Behavioral: Negative for depression. The patient is not nervous/anxious.        Objective:    Physical Exam Vitals signs and nursing note reviewed.  Constitutional:      General: He is not in acute distress.    Appearance: He is well-developed.  HENT:     Head: Normocephalic and atraumatic.     Nose: Nose normal.  Eyes:     General:        Right eye: No discharge.        Left eye: No discharge.  Cardiovascular:     Rate and Rhythm: Normal rate and regular rhythm.  Pulmonary:     Effort: Pulmonary effort is normal.  Skin:    General: Skin is dry.  Neurological:     Mental Status: He is alert and oriented to person, place, and time.  Psychiatric:        Mood and Affect: Mood normal.        Behavior: Behavior normal.     There were no vitals taken for this visit. Wt Readings from Last 3 Encounters:  03/29/19 203 lb (92.1 kg)  02/05/19 204 lb 9.6 oz (92.8 kg)  02/01/19 207 lb (93.9 kg)  Diabetic Foot Exam - Simple   No data filed     Lab Results  Component Value Date   WBC 7.3 02/05/2019   HGB 13.9 02/05/2019   HCT 39.7 02/05/2019   PLT 178.0 02/05/2019   GLUCOSE 336 (H) 02/05/2019   CHOL 168 02/05/2019   TRIG 358.0 (H) 02/05/2019   HDL 30.00 (L) 02/05/2019   LDLDIRECT 107.0 02/05/2019   LDLCALC 32 07/17/2018   ALT 15 02/05/2019   AST 13 02/05/2019   NA 135 02/05/2019   K 4.2 02/05/2019   CL 99 02/05/2019   CREATININE 1.47 02/05/2019   BUN 22 02/05/2019   CO2 28 02/05/2019   TSH 5.060 (H) 02/01/2019   PSA 2.39 11/25/2010   INR 3.5 (A) 04/02/2019   HGBA1C 9.1 (H) 02/05/2019   MICROALBUR 1.1 05/01/2015    Lab Results  Component Value Date   TSH 5.060 (H) 02/01/2019   Lab Results  Component Value Date   WBC 7.3 02/05/2019   HGB 13.9 02/05/2019   HCT 39.7 02/05/2019   MCV 90.3 02/05/2019   PLT 178.0 02/05/2019   Lab Results  Component  Value Date   NA 135 02/05/2019   K 4.2 02/05/2019   CO2 28 02/05/2019   GLUCOSE 336 (H) 02/05/2019   BUN 22 02/05/2019   CREATININE 1.47 02/05/2019   BILITOT 0.5 02/05/2019   ALKPHOS 90 02/05/2019   AST 13 02/05/2019   ALT 15 02/05/2019   PROT 7.0 02/05/2019   ALBUMIN 4.4 02/05/2019   CALCIUM 9.3 02/05/2019   ANIONGAP 11 11/07/2018   GFR 47.30 (L) 02/05/2019   Lab Results  Component Value Date   CHOL 168 02/05/2019   Lab Results  Component Value Date   HDL 30.00 (L) 02/05/2019   Lab Results  Component Value Date   LDLCALC 32 07/17/2018   Lab Results  Component Value Date   TRIG 358.0 (H) 02/05/2019   Lab Results  Component Value Date   CHOLHDL 6 02/05/2019   Lab Results  Component Value Date   HGBA1C 9.1 (H) 02/05/2019       Assessment & Plan:   Problem List Items Addressed This Visit    Diabetes mellitus type II, uncontrolled ([place]) (Chronic)    hgba1c acceptable, minimize simple carbs. Increase exercise as tolerated. Continue current meds. He had dropped his NPH down to 60 units but he notes his sugar jumped up as high as 400 so he increased his NPH back up to 72. His lowest sugar since then his lowest sugars has been 75, one high at 400 after over eating most numbers around 200. Novolog 65 units breakfast and lunch, 80 units supper NPH 68 units down from 72      Essential hypertension     no changes to meds. Encouraged heart healthy diet such as the DASH diet and exercise as tolerated.       Hypothyroidism due to acquired atrophy of thyroid    On Levothyroxine, continue to monitor      CKD (chronic kidney disease), stage III ([place])    Hydrate and monitor       Other Visit Diagnoses    Melena    -  Primary   Relevant Orders   CBC   Protime-INR   Ferritin   Anticoagulated on Coumadin       Relevant Orders   CBC   Protime-INR   Ferritin      I am having Micajah A. Fenter maintain his loperamide, diclofenac  sodium, divalproex, isosorbide  mononitrate, nitroGLYCERIN, Vitamin D (Ergocalciferol), sertraline, midodrine, glucose blood, levothyroxine, tiZANidine, atorvastatin, metoprolol succinate, warfarin, pantoprazole, pramipexole, furosemide, ranolazine, insulin aspart, insulin NPH Human, and gabapentin.  No orders of the defined types were placed in this encounter.  I discussed the assessment and treatment plan with the patient. The patient was provided an opportunity to ask questions and all were answered. The patient agreed with the plan and demonstrated an understanding of the instructions.   The patient was advised to call back or seek an in-person evaluation if the symptoms worsen or if the condition fails to improve as anticipated.  I provided 25 minutes of non-face-to-face time during this encounter.   Penni Homans, MD
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
## 2019-04-09 NOTE — Assessment & Plan Note (Signed)
Hydrate and monitor 

## 2019-04-09 NOTE — Patient Instructions (Addendum)
Novolog 65 units breakfast and lunch, 80 units supper NPH 68 units down from 72

## 2019-04-09 NOTE — Assessment & Plan Note (Signed)
On Levothyroxine, continue to monitor 

## 2019-04-09 NOTE — Assessment & Plan Note (Signed)
Patient describes one episode of black stool which has not recurred. Check PT/INR, cbc and ferritin level and he is to seek care if symptoms returns

## 2019-04-10 ENCOUNTER — Other Ambulatory Visit (INDEPENDENT_AMBULATORY_CARE_PROVIDER_SITE_OTHER): Payer: Medicare PPO

## 2019-04-10 ENCOUNTER — Other Ambulatory Visit: Payer: Self-pay

## 2019-04-10 DIAGNOSIS — K921 Melena: Secondary | ICD-10-CM

## 2019-04-10 DIAGNOSIS — Z7901 Long term (current) use of anticoagulants: Secondary | ICD-10-CM

## 2019-04-10 LAB — CBC
HCT: 40.7 % (ref 39.0–52.0)
Hemoglobin: 14.4 g/dL (ref 13.0–17.0)
MCHC: 35.3 g/dL (ref 30.0–36.0)
MCV: 90.6 fl (ref 78.0–100.0)
Platelets: 184 10*3/uL (ref 150.0–400.0)
RBC: 4.49 Mil/uL (ref 4.22–5.81)
RDW: 13.9 % (ref 11.5–15.5)
WBC: 8.3 10*3/uL (ref 4.0–10.5)

## 2019-04-10 LAB — FERRITIN: Ferritin: 29.4 ng/mL (ref 22.0–322.0)

## 2019-04-10 LAB — PROTIME-INR
INR: 3 ratio — ABNORMAL HIGH (ref 0.8–1.0)
Prothrombin Time: 33.9 s — ABNORMAL HIGH (ref 9.6–13.1)

## 2019-04-13 ENCOUNTER — Encounter: Payer: Self-pay | Admitting: Family Medicine

## 2019-04-13 MED FILL — NOVOLOG FLEXPEN SYRINGE: 100 | 8 days supply | Qty: 15 | Fill #2

## 2019-04-17 ENCOUNTER — Telehealth: Payer: Self-pay | Admitting: Neurology

## 2019-04-17 MED ORDER — GABAPENTIN 100 MG PO CAPS: ORAL_CAPSULE | ORAL | 3 refills | Status: DC

## 2019-04-17 NOTE — Telephone Encounter (Signed)
I called the patient.  The 300 mg twice daily dose of gabapentin results in too much drowsiness and gait instability, we will cut back to the 100 mg capsules taking 3 at night and 1 in the morning.

## 2019-04-17 NOTE — Telephone Encounter (Signed)
Pt has called to inform that the gabapentin (NEURONTIN) 300 MG capsule is causing him to be off balance and just makes him sleep.  Please call

## 2019-04-17 NOTE — Addendum Note (Signed)
Addended by: Kathrynn Ducking on: 04/17/2019 05:11 PM   Modules accepted: Orders

## 2019-04-18 MED FILL — GABAPENTIN 100 MG CAPSULE: 100 | 30 days supply | Qty: 120 | Fill #0

## 2019-04-20 ENCOUNTER — Institutional Professional Consult (permissible substitution): Payer: Medicare PPO | Admitting: Neurology

## 2019-04-20 ENCOUNTER — Telehealth: Payer: Self-pay

## 2019-04-20 NOTE — Telephone Encounter (Signed)

## 2019-04-23 ENCOUNTER — Other Ambulatory Visit: Payer: Self-pay

## 2019-04-23 ENCOUNTER — Ambulatory Visit (INDEPENDENT_AMBULATORY_CARE_PROVIDER_SITE_OTHER): Payer: Medicare PPO | Admitting: Pharmacist

## 2019-04-23 DIAGNOSIS — Z7901 Long term (current) use of anticoagulants: Secondary | ICD-10-CM

## 2019-04-23 DIAGNOSIS — I480 Paroxysmal atrial fibrillation: Secondary | ICD-10-CM

## 2019-04-23 DIAGNOSIS — Z5181 Encounter for therapeutic drug level monitoring: Secondary | ICD-10-CM | POA: Diagnosis not present

## 2019-04-23 DIAGNOSIS — I4821 Permanent atrial fibrillation: Secondary | ICD-10-CM

## 2019-04-23 LAB — POCT INR: INR: 2.7 (ref 2.0–3.0)

## 2019-04-23 MED ORDER — APIXABAN 5 MG PO TABS: 5.0000 mg | ORAL_TABLET | Freq: Two times a day (BID) | ORAL | 5 refills | Status: DC

## 2019-04-23 MED FILL — ELIQUIS 5 MG TABLET: 5 | 30 days supply | Qty: 60 | Fill #0

## 2019-04-25 ENCOUNTER — Other Ambulatory Visit: Payer: Self-pay

## 2019-04-25 ENCOUNTER — Ambulatory Visit (INDEPENDENT_AMBULATORY_CARE_PROVIDER_SITE_OTHER): Payer: Medicare PPO

## 2019-04-25 ENCOUNTER — Encounter: Payer: Self-pay | Admitting: Internal Medicine

## 2019-04-25 ENCOUNTER — Ambulatory Visit: Payer: Medicare PPO | Admitting: Internal Medicine

## 2019-04-25 DIAGNOSIS — G4733 Obstructive sleep apnea (adult) (pediatric): Secondary | ICD-10-CM | POA: Diagnosis not present

## 2019-04-25 DIAGNOSIS — R0602 Shortness of breath: Secondary | ICD-10-CM | POA: Diagnosis not present

## 2019-04-25 DIAGNOSIS — R0609 Other forms of dyspnea: Secondary | ICD-10-CM

## 2019-04-25 NOTE — Progress Notes (Signed)
[name] [name], male    DOB: 02/18/1946, 73 y.o.   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 hit in head around 2017  LOC > [place] > complete recovery x for chronic HA at baseline wt 175 then progressive wt gain with onset doe x first of the year 2020 (cards records says x years but def worse since Jan 2020 ov) so referred to pulmonary clinic 04/25/2019 by Dr Irish Lack with "history ofCAD (CABG 2000, stent to RCA 2009), paroxysmal atrial fibrillation,PSVT,CKD III, noncompliance, orthostatic hypotension, iron deficiency anemia, prior gastric erosions/ulcer, ?chronic diastolic CHF (no diastolic dysfunction on echo and normal BNP), bipolar depression, DM, HTN, HLD, kidney stones, OSA, RBBB, deconditoning, thyroid disease, spondylosis and back pain"  Note denied any cp at televist 03/29/2019   History of Present Illness  04/25/2019  Pulmonary/ 1st office eval/[name] [name]  Chief Complaint  Patient presents with   Pulmonary Consult    Referred by [name] for eval of CP and SOB x 2 months. Pt relates symptoms to wt gain- approx 30 lbs over the past few months.   Dyspnea:  Indolent onset progressive to point where walking the dog esp the last part where uphill slt inclined on the way back  home. Cough: none  Sleep: feeling like smothering on cpap machine since worse doe   Legs more swollen since onset  Most days when sob walking also develops ex chest tightness better with rest or NTG   No obvious day to day or daytime variability or assoc excess/ purulent sputum or mucus plugs or hemoptysis or  subjective wheeze or overt sinus or hb symptoms.    Also denies any obvious fluctuation of symptoms with weather or environmental changes or other aggravating or alleviating factors except as outlined above   No unusual exposure hx or h/o childhood pna/ asthma or knowledge of premature birth.  Current  Allergies, Complete Past Medical History, Past Surgical History, Family History, and Social History were reviewed in [employer] record.  ROS  The following are not active complaints unless bolded Hoarseness, sore throat, dysphagia, dental problems, itching, sneezing,  nasal congestion or discharge of excess mucus or purulent secretions, ear ache,   fever, chills, sweats, unintended wt loss or wt gain, classically pleuritic or exertional cp,  orthopnea pnd or arm/hand swelling  or leg swelling, presyncope, palpitations, abdominal pain, anorexia, nausea, vomiting, diarrhea  or change in bowel habits or change in bladder habits, change in stools or change in urine, dysuria, hematuria,  rash, arthralgias, visual complaints, headache, numbness, weakness or ataxia or problems with walking or coordination,  change in mood or  memory.           Past Medical History:  Diagnosis Date   Acquired atrophy of thyroid 06/05/2013   Overview:  July 2014: controlled on synthroid 49mcg since 2012 May 2015: decreased to Synthroid 50 mcg  Last Assessment & Plan:  Pt doing well with most recent TSH within normal range.  Will continue current dosage of Synthroid 61mcg. Pt reminded to take medication on empty stomach. Will continue to monitor with periodic laboratory assessment in 3 months.    Anemia    hemoglobin 7.4, iron deficiency, January, 2011, 2 unit transfusion, endoscopy normal, capsule endoscopy February, 2011 no small bowel abnormalities.   Most likely source gastric erosions, followed by GI   Anxiety  Bradycardia    CAD (coronary artery disease)    A. CABG in 2000,status post cardiac cath in 2006, 2009 ....continued chest pain and SOB despite oral medication adjestments including Ranexa. B. Cath November 2009/ mRCA - 2.75 x 23 Abbott Xience V drug-eluting stent ...11/26/2008 to distal  RCA leading to acute marginal.  C. Cath 07/2012 for CP - stable anatomy, med rx. d. cath 2015 and  05/30/2015 stable anatomy, consider Myoview if has CP again   Carotid artery disease ([place]) 08/01/2015   Doppler, May 29, 2015, 1-39% bilateral ICA    Cerebral ischemia    MRI November, 2010, chronic microvascular ischemia   CKD (chronic kidney disease), stage III ([place])    Concussion    Depression    Bipolar   Diabetes mellitus ([place])    Edema    Essential hypertension 06/05/2013   Overview:  July 2014: Controlled with Bystolic Sep 5732: Changed to atenolol.  Last Assessment & Plan:  Will change to atenolol for cost and follow Improved.  Medication compliance strongly encouraged BP: 116/64 mmHg    Overview:  July 2014: Controlled with Bystolic Sep 2025: Changed to atenolol.  Last Assessment & Plan:  Will change to atenolol for cost and follow   Falling episodes    these have occurred in the past and again recurring 2011   Family history of adverse reaction to anesthesia    "mother died during bypass surgery but not sure if it has to do with anesthesia"   Gastric ulcer    GERD (gastroesophageal reflux disease)    H/O medication noncompliance    Due to loss of insurance   H/O multiple concussions    Hard of hearing    History of blood transfusion 12/20/2013   Hx of CABG    2000,  / one median sternotomy suture broken her chest x-ray November, 2010, no clinical significance   Hyperlipidemia    Hypertension    pt. denies   Iron deficiency anemia    Long term (current) use of anticoagulants [Z79.01] 07/20/2016   Low back pain 06/12/2009   Qualifier: Diagnosis of  By: [name]    Nephrolithiasis    Orthostasis    OSA (obstructive sleep apnea)    PAF (paroxysmal atrial fibrillation) ([place])    a. dx 2017, started on amiodarone/warfarin but patient intermittently noncompliant.   PSVT (paroxysmal supraventricular tachycardia) ([place])    RBBB 07/09/2009   Qualifier: Diagnosis of  By: [name], MD, [facility], [name]    RLS (restless legs syndrome) 09/19/2009    Qualifier: Diagnosis of  By: [name]   Overview:  July 2014: Controlled with Mirapex  Last Assessment & Plan:  Patient is doing well. Will continue current management and follow clinically.   Seizure disorder ([place]) 01/09/2017   Spondylosis    C5-6, C6-7 MRI 2010   Syncope 03/2016   Thyroid disease    Tubulovillous adenoma of colon 2007   Vitamin D deficiency 05/17/2017   Wears glasses     Outpatient Medications Prior to Visit  Medication Sig Dispense Refill   apixaban (ELIQUIS) 5 MG TABS tablet Take 1 tablet (5 mg total) by mouth 2 (two) times daily. 60 tablet 5   atorvastatin (LIPITOR) 80 MG tablet Take 1 tablet (80 mg total) by mouth daily. 90 tablet 3   diclofenac sodium (VOLTAREN) 1 % GEL Apply 2 g topically daily as needed (for pain in affected area). 2 Tube 0   divalproex (DEPAKOTE  ER) 250 MG 24 hr tablet Take 3 tablets (750 mg total) by mouth at bedtime. 180 tablet 0   furosemide (LASIX) 20 MG tablet Take 1 tablet (20 mg total) by mouth daily. 30 tablet 0   gabapentin (NEURONTIN) 100 MG capsule 1 capsule in the morning, 3 in the evening 120 capsule 3   glucose blood (ACCU-CHEK AVIVA) test strip Use as directed to check blood sugar 4 times daily.  DX E11.9 200 each 6   insulin aspart (NOVOLOG FLEXPEN) 100 UNIT/ML FlexPen 60-80 with breakfast, 65 with lunch, and 85 with supper, and pen needles 3/day 20 pen 0   insulin NPH Human (NOVOLIN N) 100 UNIT/ML injection Inject 0.72 mLs (72 Units total) into the skin at bedtime. And syringes 1/day 130 mL 0   isosorbide mononitrate (IMDUR) 60 MG 24 hr tablet Take 2 tablets (120 mg total) by mouth daily. 180 tablet 0   levothyroxine (SYNTHROID, LEVOTHROID) 100 MCG tablet Take 1 tablet (100 mcg total) by mouth daily. 90 tablet 1   loperamide (IMODIUM A-D) 2 MG tablet Take 1 tablet (2 mg total) by mouth 4 (four) times daily as needed for diarrhea or loose stools. 30 tablet 0   metoprolol succinate (TOPROL-XL) 50 MG 24 hr  tablet TAKE 1&1/2 TABLETS BY MOUTH ONCE A DAY FOR A TOTAL OF 75 MG. 135 tablet 3   midodrine (PROAMATINE) 10 MG tablet Take 20 mg by mouth daily.     nitroGLYCERIN (NITROSTAT) 0.4 MG SL tablet Place 1 tablet (0.4 mg total) under the tongue every 5 (five) minutes as needed for chest pain. 25 tablet 1   pantoprazole (PROTONIX) 40 MG tablet Take 1 tablet (40 mg total) by mouth daily. 90 tablet 0   pramipexole (MIRAPEX) 1 MG tablet Take 0.5-1 tablets (0.5-1 mg total) by mouth at bedtime. 90 tablet 0   ranolazine (RANEXA) 500 MG 12 hr tablet TAKE 1 TABLET (500 MG TOTAL) BY MOUTH 2 (TWO) TIMES DAILY. 60 tablet 0   sertraline (ZOLOFT) 100 MG tablet TAKE 1 TABLET (100 MG TOTAL) BY MOUTH 2 (TWO) TIMES DAILY. 180 tablet 1   tiZANidine (ZANAFLEX) 4 MG tablet Take 1 tablet (4 mg total) by mouth every 6 (six) hours as needed for muscle spasms. 40 tablet 2   Vitamin D, Ergocalciferol, (DRISDOL) 1.25 MG (50000 UT) CAPS capsule Take 1 capsule (50,000 Units total) by mouth every 7 (seven) days. 20 capsule 0      Objective:     BP 108/62 (BP Location: Left Arm, Cuff Size: Normal)    Pulse 65    Temp 98.3 F (36.8 C) (Oral)    Ht 5\' 9"  (1.753 m)    Wt 207 lb (93.9 kg)    SpO2 97%    BMI 30.57 kg/m   SpO2: 97 % RA  Wt Readings from Last 3 Encounters:  04/25/19 207 lb (93.9 kg)  03/29/19 203 lb (92.1 kg)  02/05/19 204 lb 9.6 oz (92.8 kg)  10/26/18         193 and no sob reported at PCP ov  2017  Wt reported 170      HEENT: Poor dentition,  Nl turbinates bilaterally, and oropharynx. Nl external ear canals without cough reflex   NECK :  without JVD/Nodes/TM/ nl carotid upstrokes bilaterally   LUNGS: no acc muscle use,  Nl contour chest which is clear to A and P bilaterally without cough on insp or exp maneuvers   CV:  RRR  no s3  or murmur or increase in P2, and trace symb bilateral LE edema   ABD:  soft and nontender with nl inspiratory excursion in the supine position. No bruits or  organomegaly appreciated, bowel sounds nl  MS:  Nl gait/ ext warm without deformities, calf tenderness, cyanosis or clubbing No obvious joint restrictions   SKIN: warm and dry with chronic appearing venous stasis dermatitis changes both LE's  NEURO:  alert, approp, nl sensorium with  no motor or cerebellar deficits apparent.    CXR PA and Lateral:   04/25/2019 :    I personally reviewed images and agree with radiology impression as follows:    No active cardiopulmonary disease.   Labs  reviewed:      Chemistry      Component Value Date/Time   NA 135 02/05/2019 0914   NA 142 02/01/2019 1019   K 4.2 02/05/2019 0914   CL 99 02/05/2019 0914   CO2 28 02/05/2019 0914   BUN 22 02/05/2019 0914   BUN 16 02/01/2019 1019   CREATININE 1.47 02/05/2019 0914   CREATININE 1.08 03/03/2017 1622      Component Value Date/Time   CALCIUM 9.3 02/05/2019 0914   ALKPHOS 90 02/05/2019 0914   AST 13 02/05/2019 0914   ALT 15 02/05/2019 0914   BILITOT 0.5 02/05/2019 0914   BILITOT 0.6 09/27/2017 0823        Lab Results  Component Value Date   WBC 8.3 04/10/2019   HGB 14.4 04/10/2019   HCT 40.7 04/10/2019   MCV 90.6 04/10/2019   PLT 184.0 04/10/2019       Lab Results  Component Value Date   TSH 5.060 (H) 02/01/2019     Lab Results  Component Value Date   PROBNP 96 02/01/2019             Assessment   DOE (dyspnea on exertion) Onset around 2017 at wt  170 with progressive wt gain since  To 207 04/25/2019  Assoc with ex cp better with ntg  - 04/25/2019   Walked RA  2 laps @  approx 282ft each @ mod fast pace  stopped due to  End of study sats 98% and pulse 80  No sob or cp reproduced   Assoc with almost 40 lb wt gain since onset and cp/sense of palpitations with excellent walking study today that did not provoke sob or cp and according to notes pt declines repeat exercise testing as thinks that's what brought on "all these problems in the first place"   I used a car analogy to  try to help him understand that when he complains that a certain mph or rpm reproduces the problem that a good mechanic needs to take it for a test drive to figure out what the problem is and that he should address this again with his primary cardiologist  = Varnasi, as this does not appear at all c/w a pulmonary etiology for his symptoms.   >>>> CPST should be next study if any pulmonary problem is being considered in ddx but this will need to wait until COVID - 19 restrictions have been lifted.   In the meantime pulmonary f/u is prn       OSA (obstructive sleep apnea) Sleep study lab 08/2016 [facility] 14. Autocpap 5-15 cm water per [employer]  Pt says cpap not working as well, will request download and have him seen by sleep medicine if needed since Dr Corrie Dandy no longer in [place]  Total time devoted to counseling  > 50 % of initial 60 min office visit:  reviewed case with pt/  directly observed portions of ambulatory 02 saturation study/ discussion of options/alternatives/ personally creating written customized instructions  in presence of pt  then going over those specific  Instructions directly with the pt including how to use all of the meds but in particular covering each new medication in detail and the difference between the maintenance= "automatic" meds and the prns using an action plan format for the latter (If this problem/symptom => do that organization reading Left to right).  Please see AVS from this visit for a full list of these instructions which I personally wrote for this pt and  are unique to this visit.      Christinia Gully, MD 04/25/2019

## 2019-04-25 NOTE — Patient Instructions (Signed)
Continue to walking but not to the point of chest discomfort   Please remember to go to the  x-ray department  for your tests - we will call you with the results when they are available  And decide what to do next

## 2019-04-26 ENCOUNTER — Encounter: Payer: Self-pay | Admitting: Internal Medicine

## 2019-04-26 ENCOUNTER — Encounter: Payer: Self-pay | Admitting: Family Medicine

## 2019-04-26 MED FILL — ULTICARE PEN NDL 8MM 31G: 31G X 8 MM | 34 days supply | Qty: 100 | Fill #0

## 2019-04-26 MED FILL — NOVOLOG FLEXPEN SYRINGE: 100 | 8 days supply | Qty: 15 | Fill #3

## 2019-04-26 NOTE — Assessment & Plan Note (Addendum)
Sleep study lab 08/2016 [facility]. Autocpap 5-15 cm water per [employer]   Pt says cpap not working as well, will request download and have him seen by sleep medicine if needed since Dr Corrie Dandy no longer in [place]    Total time devoted to counseling  > 50 % of initial 60 min office visit:  reviewed case with pt/  directly observed portions of ambulatory 02 saturation study/ discussion of options/alternatives/ personally creating written customized instructions  in presence of pt  then going over those specific  Instructions directly with the pt including how to use all of the meds but in particular covering each new medication in detail and the difference between the maintenance= "automatic" meds and the prns using an action plan format for the latter (If this problem/symptom => do that organization reading Left to right).  Please see AVS from this visit for a full list of these instructions which I personally wrote for this pt and  are unique to this visit.

## 2019-04-26 NOTE — Assessment & Plan Note (Addendum)
Onset around 2017 at wt  170 with progressive wt gain since  To 207 04/25/2019  Assoc with ex cp better with ntg  - 04/25/2019   Walked RA  2 laps @  approx 226ft each @ mod fast pace  stopped due to  End of study sats 98% and pulse 80  No sob or cp reproduced   Assoc with almost 40 lb wt gain since onset and cp/sense of palpitations with excellent walking study today that did not provoke sob or cp and according to notes pt declines repeat exercise testing as thinks that's what brought on "all these problems in the first place"   I used a car analogy to try to help him understand that when he complains that a certain mph or rpm reproduces the problem that a good mechanic needs to take it for a test drive to figure out what the problem is and that he should address this again with his primary cardiologist  = Varnasi, as this does not appear at all c/w a pulmonary etiology for his symptoms.   >>>> CPST should be next study if any pulmonary problem is being considered in ddx but this will need to wait until COVID - 19 restrictions have been lifted.    In the meantime pulmonary f/u is prn

## 2019-04-27 MED ORDER — INSULIN NPH (HUMAN) (ISOPHANE) 100 UNIT/ML ~~LOC~~ SUSP: 72.0000 [IU] | Freq: Every day | SUBCUTANEOUS | 0 refills | Status: DC

## 2019-04-27 MED ORDER — INSULIN ASPART 100 UNIT/ML FLEXPEN: PEN_INJECTOR | SUBCUTANEOUS | 0 refills | Status: DC

## 2019-05-01 ENCOUNTER — Telehealth: Payer: Self-pay | Admitting: Internal Medicine

## 2019-05-01 NOTE — Telephone Encounter (Signed)
Per MW- pt needs sleep consult based on recent cpap dl  Spoke with the pt  He is already est with Dr Fleeta Emmer his last 07/20/18  Reminder placed to see Dr Halford Chessman when the schedule becomes available  Nothing further needed

## 2019-05-02 ENCOUNTER — Other Ambulatory Visit: Payer: Self-pay

## 2019-05-04 ENCOUNTER — Ambulatory Visit (INDEPENDENT_AMBULATORY_CARE_PROVIDER_SITE_OTHER): Payer: Medicare PPO | Admitting: Endocrinology

## 2019-05-04 ENCOUNTER — Other Ambulatory Visit: Payer: Self-pay

## 2019-05-04 ENCOUNTER — Encounter: Payer: Self-pay | Admitting: Endocrinology

## 2019-05-04 VITALS — BP 142/68 | HR 69 | Temp 98.1°F | Wt 212.6 lb

## 2019-05-04 DIAGNOSIS — E1129 Type 2 diabetes mellitus with other diabetic kidney complication: Secondary | ICD-10-CM | POA: Diagnosis not present

## 2019-05-04 DIAGNOSIS — Z794 Long term (current) use of insulin: Secondary | ICD-10-CM

## 2019-05-04 DIAGNOSIS — E1165 Type 2 diabetes mellitus with hyperglycemia: Secondary | ICD-10-CM | POA: Diagnosis not present

## 2019-05-04 DIAGNOSIS — E1151 Type 2 diabetes mellitus with diabetic peripheral angiopathy without gangrene: Secondary | ICD-10-CM | POA: Diagnosis not present

## 2019-05-04 DIAGNOSIS — R609 Edema, unspecified: Secondary | ICD-10-CM | POA: Diagnosis not present

## 2019-05-04 DIAGNOSIS — I10 Essential (primary) hypertension: Secondary | ICD-10-CM

## 2019-05-04 LAB — POCT GLYCOSYLATED HEMOGLOBIN (HGB A1C): Hemoglobin A1C: 8.8 % — AB (ref 4.0–5.6)

## 2019-05-04 MED ORDER — INSULIN NPH (HUMAN) (ISOPHANE) 100 UNIT/ML ~~LOC~~ SUSP: 60.0000 [IU] | Freq: Every day | SUBCUTANEOUS | 0 refills | Status: DC

## 2019-05-04 MED ORDER — INSULIN ASPART 100 UNIT/ML FLEXPEN: PEN_INJECTOR | SUBCUTANEOUS | 0 refills | Status: DC

## 2019-05-04 MED FILL — NOVOLOG FLEXPEN SYRINGE: 100 | 8 days supply | Qty: 15 | Fill #0

## 2019-05-04 NOTE — Patient Instructions (Addendum)
check your blood sugar 4 times a day.  vary the time of day when you check, between before the 3 meals, and at bedtime.  also check if you have symptoms of your blood sugar being too high or too low.  please keep a record of the readings and bring it to your next appointment here (or you can bring the meter itself).  You can write it on any piece of paper.  please call us sooner if your blood sugar goes below 70, or if you have a lot of readings over 200. Please reduce the NPH to 60 bedtime, and:  Please increase the novolog to 65-85 with breakfast, 70 with lunch, and 100 with supper.   Please come back for a follow-up appointment in 2 months.

## 2019-05-04 NOTE — Progress Notes (Signed)
Subjective:    Patient ID: [name] [name], male    DOB: 05-Jan-1946, 73 y.o.   MRN: [id number]  HPI Pt returns for f/u of diabetes mellitus:  DM type: Insulin-requiring type 2.  Dx'ed: 9562 Complications: polyneuropathy, renal insuff, CAD and PAD.  Therapy: insulin since 2008 DKA: never Severe hypoglycemia: never Pancreatitis: never Pancreatic imaging: 2017 CT showed fatty replacement of the pancreatic head and body.  Other: he takes multiple daily injections; he takes human insulin at HS, due to cost. Interval history: no cbg record, but states cbg's vary from 78-325.  It is in general lowest fasting, and highest at HS.  He seldom has hypoglycemia, and these episodes are mild.  He says he never misses the insulin.  pt states he feels well in general.   Past Medical History:  Diagnosis Date  . Acquired atrophy of thyroid 06/05/2013   Overview:  July 2014: controlled on synthroid 43mcg since 2012 May 2015: decreased to Synthroid 50 mcg  Last Assessment & Plan:  Pt doing well with most recent TSH within normal range.  Will continue current dosage of Synthroid 9mcg. Pt reminded to take medication on empty stomach. Will continue to monitor with periodic laboratory assessment in 3 months.   . Anemia    hemoglobin 7.4, iron deficiency, January, 2011, 2 unit transfusion, endoscopy normal, capsule endoscopy February, 2011 no small bowel abnormalities.   Most likely source gastric erosions, followed by GI  . Anxiety   . Bradycardia   . CAD (coronary artery disease)    A. CABG in 2000,status post cardiac cath in 2006, 2009 ....continued chest pain and SOB despite oral medication adjestments including Ranexa. B. Cath November 2009/ mRCA - 2.75 x 23 Abbott Xience V drug-eluting stent ...11/26/2008 to distal  RCA leading to acute marginal.  C. Cath 07/2012 for CP - stable anatomy, med rx. d. cath 2015 and 05/30/2015 stable anatomy, consider Myoview if has CP again  . Carotid artery disease ([place])  08/01/2015   Doppler, May 29, 2015, 1-39% bilateral ICA   . Cerebral ischemia    MRI November, 2010, chronic microvascular ischemia  . CKD (chronic kidney disease), stage III ([place])   . Concussion   . Depression    Bipolar  . Diabetes mellitus ([place])   . Edema   . Essential hypertension 06/05/2013   Overview:  July 2014: Controlled with Bystolic Sep 1308: Changed to atenolol.  Last Assessment & Plan:  Will change to atenolol for cost and follow Improved.  Medication compliance strongly encouraged BP: 116/64 mmHg    Overview:  July 2014: Controlled with Bystolic Sep 6578: Changed to atenolol.  Last Assessment & Plan:  Will change to atenolol for cost and follow  . Falling episodes    these have occurred in the past and again recurring 2011  . Family history of adverse reaction to anesthesia    "mother died during bypass surgery but not sure if it has to do with anesthesia"  . Gastric ulcer   . GERD (gastroesophageal reflux disease)   . H/O medication noncompliance    Due to loss of insurance  . H/O multiple concussions   . Hard of hearing   . History of blood transfusion 12/20/2013  . Hx of CABG    2000,  / one median sternotomy suture broken her chest x-ray November, 2010, no clinical significance  . Hyperlipidemia   . Hypertension    pt. denies  . Iron deficiency anemia   . Long  term (current) use of anticoagulants [Z79.01] 07/20/2016  . Low back pain 06/12/2009   Qualifier: Diagnosis of  By: [name]   . Nephrolithiasis   . Orthostasis   . OSA (obstructive sleep apnea)   . PAF (paroxysmal atrial fibrillation) ([place])    a. dx 2017, started on amiodarone/warfarin but patient intermittently noncompliant.  Marland Kitchen PSVT (paroxysmal supraventricular tachycardia) ([place])   . RBBB 07/09/2009   Qualifier: Diagnosis of  By: [name], MD, [name] [name]   . RLS (restless legs syndrome) 09/19/2009   Qualifier: Diagnosis of  By: [name]   Overview:  July 2014: Controlled with Mirapex   Last Assessment & Plan:  Patient is doing well. Will continue current management and follow clinically.  . Seizure disorder ([place]) 01/09/2017  . Spondylosis    C5-6, C6-7 MRI 2010  . Syncope 03/2016  . Thyroid disease   . Tubulovillous adenoma of colon 2007  . Vitamin D deficiency 05/17/2017  . Wears glasses     Past Surgical History:  Procedure Laterality Date  . ANTERIOR CERVICAL DECOMP/DISCECTOMY FUSION N/A 05/31/2016   Procedure: ANTERIOR CERVICAL DECOMPRESSION/DISCECTOMY FUSION CERVICAL FIVE-SIX,CERVICAL SIX-SEVEN;  Surgeon: [name], MD;  Location: [place] [facility];  Service: Neurosurgery;  Laterality: N/A;  . CARDIAC CATHETERIZATION N/A 05/30/2015   Procedure: Left Heart Cath and Coronary Angiography;  Surgeon: [name], MD;  Location: [place] [facility];  Service: Cardiovascular;  Laterality: N/A;  . COLONOSCOPY    . CORONARY ARTERY BYPASS GRAFT     2000  . ESOPHAGOGASTRODUODENOSCOPY    . LEFT HEART CATH AND CORS/GRAFTS ANGIOGRAPHY N/A 12/15/2017   Procedure: LEFT HEART CATH AND CORS/GRAFTS ANGIOGRAPHY;  Surgeon: [name], MD;  Location: [place] [facility];  Service: Cardiovascular;  Laterality: N/A;  . LEFT HEART CATHETERIZATION WITH CORONARY/GRAFT ANGIOGRAM N/A 08/01/2012   Procedure: LEFT HEART CATHETERIZATION WITH Beatrix Fetters;  Surgeon: [name], MD;  Location: [facility] C[facility] LAB;  Service: Cardiovascular;  Laterality: N/A;  . LEFT HEART CATHETERIZATION WITH CORONARY/GRAFT ANGIOGRAM N/A 01/03/2015   Procedure: LEFT HEART CATHETERIZATION WITH Beatrix Fetters;  Surgeon: [name], MD;  Location: [facility] CATH LAB;  Service: Cardiovascular;  Laterality: N/A;  . NASAL SEPTUM SURGERY     UP3  . PERCUTANEOUS CORONARY STENT INTERVENTION (PCI-S)  10/2008   mRCA PCI  2.75 x 23 Abbott Xience V drug-eluting stent   . ULTRASOUND GUIDANCE FOR VASCULAR ACCESS  12/15/2017   Procedure: Ultrasound Guidance For Vascular Access;  Surgeon: [name],  MD;  Location: [place] CV LAB;  Service: Cardiovascular;;    Social History   Socioeconomic History  . Marital status: Married    Spouse name: Not on file  . Number of children: 4  . Years of education: 66  . Highest education level: Not on file  Occupational History  . Occupation: retired  Scientific laboratory technician  . Financial resource strain: Not on file  . Food insecurity:    Worry: Not on file    Inability: Not on file  . Transportation needs:    Medical: Not on file    Non-medical: Not on file  Tobacco Use  . Smoking status: Never Smoker  . Smokeless tobacco: Never Used  Substance and Sexual Activity  . Alcohol use: No    Alcohol/week: 0.0 standard drinks    Comment: stopped drinking in 1998  . Drug use: No  . Sexual activity: Not on file  Lifestyle  . Physical activity:  Days per week: Not on file    Minutes per session: Not on file  . Stress: Not on file  Relationships  . Social connections:    Talks on phone: Not on file    Gets together: Not on file    Attends religious service: Not on file    Active member of club or organization: Not on file    Attends meetings of clubs or organizations: Not on file    Relationship status: Not on file  . Intimate partner violence:    Fear of current or ex partner: Not on file    Emotionally abused: Not on file    Physically abused: Not on file    Forced sexual activity: Not on file  Other Topics Concern  . Not on file  Social History Narrative   Patient is right handed.   Patient does not drink caffeine.    Current Outpatient Medications on File Prior to Visit  Medication Sig Dispense Refill  . apixaban (ELIQUIS) 5 MG TABS tablet Take 1 tablet (5 mg total) by mouth 2 (two) times daily. 60 tablet 5  . atorvastatin (LIPITOR) 80 MG tablet Take 1 tablet (80 mg total) by mouth daily. 90 tablet 3  . diclofenac sodium (VOLTAREN) 1 % GEL Apply 2 g topically daily as needed (for pain in affected area). 2 Tube 0  . divalproex  (DEPAKOTE ER) 250 MG 24 hr tablet Take 3 tablets (750 mg total) by mouth at bedtime. 180 tablet 0  . furosemide (LASIX) 20 MG tablet Take 1 tablet (20 mg total) by mouth daily. 30 tablet 0  . gabapentin (NEURONTIN) 100 MG capsule 1 capsule in the morning, 3 in the evening 120 capsule 3  . glucose blood (ACCU-CHEK AVIVA) test strip Use as directed to check blood sugar 4 times daily.  DX E11.9 200 each 6  . isosorbide mononitrate (IMDUR) 60 MG 24 hr tablet Take 2 tablets (120 mg total) by mouth daily. 180 tablet 0  . levothyroxine (SYNTHROID, LEVOTHROID) 100 MCG tablet Take 1 tablet (100 mcg total) by mouth daily. 90 tablet 1  . loperamide (IMODIUM A-D) 2 MG tablet Take 1 tablet (2 mg total) by mouth 4 (four) times daily as needed for diarrhea or loose stools. 30 tablet 0  . metoprolol succinate (TOPROL-XL) 50 MG 24 hr tablet TAKE 1&1/2 TABLETS BY MOUTH ONCE A DAY FOR A TOTAL OF 75 MG. 135 tablet 3  . midodrine (PROAMATINE) 10 MG tablet Take 20 mg by mouth daily.    . nitroGLYCERIN (NITROSTAT) 0.4 MG SL tablet Place 1 tablet (0.4 mg total) under the tongue every 5 (five) minutes as needed for chest pain. 25 tablet 1  . pantoprazole (PROTONIX) 40 MG tablet Take 1 tablet (40 mg total) by mouth daily. 90 tablet 0  . pramipexole (MIRAPEX) 1 MG tablet Take 0.5-1 tablets (0.5-1 mg total) by mouth at bedtime. 90 tablet 0  . ranolazine (RANEXA) 500 MG 12 hr tablet TAKE 1 TABLET (500 MG TOTAL) BY MOUTH 2 (TWO) TIMES DAILY. 60 tablet 0  . sertraline (ZOLOFT) 100 MG tablet TAKE 1 TABLET (100 MG TOTAL) BY MOUTH 2 (TWO) TIMES DAILY. 180 tablet 1  . tiZANidine (ZANAFLEX) 4 MG tablet Take 1 tablet (4 mg total) by mouth every 6 (six) hours as needed for muscle spasms. 40 tablet 2  . Vitamin D, Ergocalciferol, (DRISDOL) 1.25 MG (50000 UT) CAPS capsule Take 1 capsule (50,000 Units total) by mouth every 7 (seven) days. [place]  capsule 0   No current facility-administered medications on file prior to visit.     Allergies   Allergen Reactions  . Morphine Other (See Comments)    hallucinations  . Shellfish-Derived Products Itching    Family History  Problem Relation Age of Onset  . Pancreatic cancer Brother   . Diabetes Brother   . Coronary artery disease Brother   . Stroke Brother   . Diabetes Brother   . Diabetes Mother   . Heart failure Mother   . Heart failure Father   . Hypothyroidism Brother   . Coronary artery disease Brother   . Other Brother        colon surgery  . Heart attack Other        Nephew  . Irregular heart beat Daughter   . Cancer Maternal Grandmother        unknown   . Colon cancer Neg Hx   . Stomach cancer Neg Hx   . Liver cancer Neg Hx   . Rectal cancer Neg Hx   . Esophageal cancer Neg Hx     BP (!) 142/68 (BP Location: Left Arm, Patient Position: Sitting, Cuff Size: Normal)   Pulse 69   Temp 98.1 F (36.7 C) (Oral)   Wt 212 lb 9.6 oz (96.4 kg)   SpO2 98%   BMI 31.40 kg/m    Review of Systems He denies hypoglycemia.      Objective:   Physical Exam VITAL SIGNS:  See vs page GENERAL: no distress Pulses: dorsalis pedis intact bilat.   MSK: no deformity of the feet CV: trace bilat leg edema Skin:  no ulcer on the feet.  normal color and temp on the feet.  Old healed surgical scar (vein harvest) at the right leg.  Neuro: sensation is intact to touch on the feet  Lab Results  Component Value Date   HGBA1C 8.8 (A) 05/04/2019   Lab Results  Component Value Date   CREATININE 1.47 02/05/2019   BUN 22 02/05/2019   NA 135 02/05/2019   K 4.2 02/05/2019   CL 99 02/05/2019   CO2 28 02/05/2019       Assessment & Plan:  Insulin-requiring type 2 DM, with PAD: he needs increased rx.    Renal insuff: he needs most of his insulin at meals.   Edema: This limits rx options.   HTN: recheck next time.     Patient Instructions  check your blood sugar 4 times a day.  vary the time of day when you check, between before the 3 meals, and at bedtime.  also check if  you have symptoms of your blood sugar being too high or too low.  please keep a record of the readings and bring it to your next appointment here (or you can bring the meter itself).  You can write it on any piece of paper.  please call us sooner if your blood sugar goes below 70, or if you have a lot of readings over 200. Please reduce the NPH to 60 bedtime, and:  Please increase the novolog to 65-85 with breakfast, 70 with lunch, and 100 with supper.   Please come back for a follow-up appointment in 2 months.

## 2019-05-07 ENCOUNTER — Other Ambulatory Visit: Payer: Self-pay

## 2019-05-07 ENCOUNTER — Encounter: Payer: Self-pay | Admitting: Internal Medicine

## 2019-05-07 ENCOUNTER — Telehealth: Payer: Self-pay

## 2019-05-07 ENCOUNTER — Ambulatory Visit (INDEPENDENT_AMBULATORY_CARE_PROVIDER_SITE_OTHER): Payer: Medicare PPO | Admitting: Internal Medicine

## 2019-05-07 VITALS — Ht 69.0 in | Wt 207.0 lb

## 2019-05-07 DIAGNOSIS — I480 Paroxysmal atrial fibrillation: Secondary | ICD-10-CM | POA: Diagnosis not present

## 2019-05-07 DIAGNOSIS — K921 Melena: Secondary | ICD-10-CM | POA: Diagnosis not present

## 2019-05-07 DIAGNOSIS — Z7901 Long term (current) use of anticoagulants: Secondary | ICD-10-CM

## 2019-05-07 DIAGNOSIS — Z86010 Personal history of colonic polyps: Secondary | ICD-10-CM

## 2019-05-07 DIAGNOSIS — R1310 Dysphagia, unspecified: Secondary | ICD-10-CM | POA: Diagnosis not present

## 2019-05-07 DIAGNOSIS — R1319 Other dysphagia: Secondary | ICD-10-CM

## 2019-05-07 NOTE — Telephone Encounter (Signed)
[facility] Pre-operative Risk Assessment     Request for surgical clearance:     Endoscopy Procedure  What type of surgery is being performed?     EGD and colonoscopy  When is this surgery scheduled?     05/16/2019  What type of clearance is required ?   Pharmacy  Are there any medications that need to be held prior to surgery and how long? Eliquis for 2 days  Practice name and name of physician performing surgery?      [facility]  What is your office phone and fax number?      Phone- [phone]  Fax409 [phone]  Anesthesia type (None, local, MAC, general) ?       MAC

## 2019-05-07 NOTE — Progress Notes (Signed)
TELEHEALTH ENCOUNTER IN SETTING OF COVID-19 PANDEMIC - REQUESTED BY PATIENT SERVICE PROVIDED BY TELEMEDECINE - TYPE: Telephone PATIENT LOCATION: home PATIENT HAS CONSENTED TO TELEHEALTH VISIT PROVIDER LOCATION: OFFICE REFERRING PROVIDER:Dr. Charlett Blake PARTICIPANTS OTHER THAN PATIENT:none TIME SPENT ON CALL:21 mins    [name] [name] 72 y.o. 1946-11-23 [id number]  Assessment & Plan:   Encounter Diagnoses  Name Primary?   Melena Yes   Esophageal dysphagia    Long term current use of anticoagulant - Eliquis    PAF (paroxysmal atrial fibrillation) ([place])    Hx of adenomatous polyp of colon - 2007    The patient needs an upper GI endoscopy and a colonoscopy I think.  Melena could be upper in source, could be lower.  He is overdue for a surveillance colonoscopy. The risks and benefits as well as alternatives of endoscopic procedure(s) have been discussed and reviewed. All questions answered. The patient agrees to proceed.  He should hold his Eliquis 2 days prior and we will clarify that with cardiology.  He understands there is an extra rare but real risk of stroke while off Eliquis.  He also has a history of a DVT so blood clot could occur.  He understands I cannot guarantee that we are trying to minimize the chances of contracting COVID-19 by coming into our facility.  He may benefit from iron supplementation given his low normal ferritin and it might help his restless leg as well.  I appreciate the opportunity to care for this patient. CC: [name], MD  Subjective:   Chief Complaint:  HPI The patient is a 73 year old white man previously known to Dr. Verl Blalock, he also had problems with an iron deficiency anemia about 10 years ago and had some gastric ulcers but a negative capsule endoscopy and colonoscopy.  In 2007 he had a tubulovillous adenoma of the colon.  He has not had a colonoscopy since 2010.  He had "jet black" stools 2 times in early May he saw Dr. Charlett Blake  and his hemoglobin was normal his ferritin was 28 and his INR was elevated in the supratherapeutic range.  He has recently been changed from warfarin to Eliquis.  He has not been on iron supplementation has not had Pepto-Bismol nothing else to make his stools dark or black and he has not had any nonsteroidals or aspirin.  In addition to this melena x2 he has been having some intermittent solid food dysphagia for a couple of months or so most noticeable with apples but some other solid foods may cause it.  Defecation is regular otherwise.  He is on pantoprazole I think long-term.  No abdominal pain change in bowel habits.  No bright red rectal bleeding.  Wt Readings from Last 3 Encounters:  05/07/19 207 lb (93.9 kg)  05/04/19 212 lb 9.6 oz (96.4 kg)  04/25/19 207 lb (93.9 kg)    Allergies  Allergen Reactions   Morphine Other (See Comments)    hallucinations   Shellfish-Derived Products Itching   Current Meds  Medication Sig   apixaban (ELIQUIS) 5 MG TABS tablet Take 1 tablet (5 mg total) by mouth 2 (two) times daily.   atorvastatin (LIPITOR) 80 MG tablet Take 1 tablet (80 mg total) by mouth daily.   diclofenac sodium (VOLTAREN) 1 % GEL Apply 2 g topically daily as needed (for pain in affected area).   divalproex (DEPAKOTE ER) 250 MG 24 hr tablet Take 3 tablets (750 mg total) by mouth at bedtime.  furosemide (LASIX) 20 MG tablet Take 1 tablet (20 mg total) by mouth daily.   gabapentin (NEURONTIN) 100 MG capsule 1 capsule in the morning, 3 in the evening   glucose blood (ACCU-CHEK AVIVA) test strip Use as directed to check blood sugar 4 times daily.  DX E11.9   insulin aspart (NOVOLOG FLEXPEN) 100 UNIT/ML FlexPen 65-85 with breakfast, 70 with lunch, and 100 with supper, and pen needles 3/day   insulin NPH Human (NOVOLIN N) 100 UNIT/ML injection Inject 0.6 mLs (60 Units total) into the skin at bedtime. And syringes 1/day   isosorbide mononitrate (IMDUR) 60 MG 24 hr tablet Take 2  tablets (120 mg total) by mouth daily.   levothyroxine (SYNTHROID, LEVOTHROID) 100 MCG tablet Take 1 tablet (100 mcg total) by mouth daily.   loperamide (IMODIUM A-D) 2 MG tablet Take 1 tablet (2 mg total) by mouth 4 (four) times daily as needed for diarrhea or loose stools.   metoprolol succinate (TOPROL-XL) 50 MG 24 hr tablet TAKE 1&1/2 TABLETS BY MOUTH ONCE A DAY FOR A TOTAL OF 75 MG.   midodrine (PROAMATINE) 10 MG tablet Take 20 mg by mouth daily.   nitroGLYCERIN (NITROSTAT) 0.4 MG SL tablet Place 1 tablet (0.4 mg total) under the tongue every 5 (five) minutes as needed for chest pain.   pantoprazole (PROTONIX) 40 MG tablet Take 1 tablet (40 mg total) by mouth daily.   pramipexole (MIRAPEX) 1 MG tablet Take 0.5-1 tablets (0.5-1 mg total) by mouth at bedtime.   ranolazine (RANEXA) 500 MG 12 hr tablet TAKE 1 TABLET (500 MG TOTAL) BY MOUTH 2 (TWO) TIMES DAILY.   sertraline (ZOLOFT) 100 MG tablet TAKE 1 TABLET (100 MG TOTAL) BY MOUTH 2 (TWO) TIMES DAILY.   tiZANidine (ZANAFLEX) 4 MG tablet Take 1 tablet (4 mg total) by mouth every 6 (six) hours as needed for muscle spasms.   Vitamin D, Ergocalciferol, (DRISDOL) 1.25 MG (50000 UT) CAPS capsule Take 1 capsule (50,000 Units total) by mouth every 7 (seven) days.   Past Medical History:  Diagnosis Date   Acquired atrophy of thyroid 06/05/2013   Overview:  July 2014: controlled on synthroid 32mcg since 2012 May 2015: decreased to Synthroid 50 mcg  Last Assessment & Plan:  Pt doing well with most recent TSH within normal range.  Will continue current dosage of Synthroid 14mcg. Pt reminded to take medication on empty stomach. Will continue to monitor with periodic laboratory assessment in 3 months.    Anemia    hemoglobin 7.4, iron deficiency, January, 2011, 2 unit transfusion, endoscopy normal, capsule endoscopy February, 2011 no small bowel abnormalities.   Most likely source gastric erosions, followed by GI   Anxiety    Bradycardia     CAD (coronary artery disease)    A. CABG in 2000,status post cardiac cath in 2006, 2009 ....continued chest pain and SOB despite oral medication adjestments including Ranexa. B. Cath November 2009/ mRCA - 2.75 x 23 Abbott Xience V drug-eluting stent ...11/26/2008 to distal  RCA leading to acute marginal.  C. Cath 07/2012 for CP - stable anatomy, med rx. d. cath 2015 and 05/30/2015 stable anatomy, consider Myoview if has CP again   Carotid artery disease ([place]) 08/01/2015   Doppler, May 29, 2015, 1-39% bilateral ICA    Cerebral ischemia    MRI November, 2010, chronic microvascular ischemia   CKD (chronic kidney disease), stage III ([place])    Concussion    Depression    Bipolar   Diabetes mellitus (  [place])    Edema    Essential hypertension 06/05/2013   Overview:  July 2014: Controlled with Bystolic Sep 2878: Changed to atenolol.  Last Assessment & Plan:  Will change to atenolol for cost and follow Improved.  Medication compliance strongly encouraged BP: 116/64 mmHg    Overview:  July 2014: Controlled with Bystolic Sep 6767: Changed to atenolol.  Last Assessment & Plan:  Will change to atenolol for cost and follow   Falling episodes    these have occurred in the past and again recurring 2011   Family history of adverse reaction to anesthesia    "mother died during bypass surgery but not sure if it has to do with anesthesia"   Gastric ulcer    GERD (gastroesophageal reflux disease)    H/O medication noncompliance    Due to loss of insurance   H/O multiple concussions    Hard of hearing    History of blood transfusion 12/20/2013   Hx of CABG    2000,  / one median sternotomy suture broken her chest x-ray November, 2010, no clinical significance   Hyperlipidemia    Hypertension    pt. denies   Iron deficiency anemia    Long term (current) use of anticoagulants [Z79.01] 07/20/2016   Low back pain 06/12/2009   Qualifier: Diagnosis of  By: [name]    Nephrolithiasis     Orthostasis    OSA (obstructive sleep apnea)    PAF (paroxysmal atrial fibrillation) ([place])    a. dx 2017, started on amiodarone/warfarin but patient intermittently noncompliant.   PSVT (paroxysmal supraventricular tachycardia) ([place])    RBBB 07/09/2009   Qualifier: Diagnosis of  By: [name], MD, [facility], [name]    RLS (restless legs syndrome) 09/19/2009   Qualifier: Diagnosis of  By: [name]   Overview:  July 2014: Controlled with Mirapex  Last Assessment & Plan:  Patient is doing well. Will continue current management and follow clinically.   Seizure disorder ([place]) 01/09/2017   Spondylosis    C5-6, C6-7 MRI 2010   Syncope 03/2016   Thyroid disease    Tubulovillous adenoma of colon 2007   Vitamin D deficiency 05/17/2017   Wears glasses    Past Surgical History:  Procedure Laterality Date   ANTERIOR CERVICAL DECOMP/DISCECTOMY FUSION N/A 05/31/2016   Procedure: ANTERIOR CERVICAL DECOMPRESSION/DISCECTOMY FUSION CERVICAL FIVE-SIX,CERVICAL SIX-SEVEN;  Surgeon: [name], MD;  Location: [place] [facility];  Service: Neurosurgery;  Laterality: N/A;   CARDIAC CATHETERIZATION N/A 05/30/2015   Procedure: Left Heart Cath and Coronary Angiography;  Surgeon: [name], MD;  Location: [place] CV LAB;  Service: Cardiovascular;  Laterality: N/A;   COLONOSCOPY     CORONARY ARTERY BYPASS GRAFT     2000   ESOPHAGOGASTRODUODENOSCOPY     LEFT HEART CATH AND CORS/GRAFTS ANGIOGRAPHY N/A 12/15/2017   Procedure: LEFT HEART CATH AND CORS/GRAFTS ANGIOGRAPHY;  Surgeon: [name], MD;  Location: [place] CV LAB;  Service: Cardiovascular;  Laterality: N/A;   LEFT HEART CATHETERIZATION WITH CORONARY/GRAFT ANGIOGRAM N/A 08/01/2012   Procedure: LEFT HEART CATHETERIZATION WITH Beatrix Fetters;  Surgeon: [name], MD;  Location: [facility] CATH LAB;  Service: Cardiovascular;  Laterality: N/A;   LEFT HEART CATHETERIZATION WITH CORONARY/GRAFT ANGIOGRAM N/A 01/03/2015    Procedure: LEFT HEART CATHETERIZATION WITH Beatrix Fetters;  Surgeon: [name], MD;  Location: [facility] CATH LAB;  Service: Cardiovascular;  Laterality: N/A;   NASAL SEPTUM SURGERY     UP3  PERCUTANEOUS CORONARY STENT INTERVENTION (PCI-S)  10/2008   mRCA PCI  2.75 x 23 Abbott Xience V drug-eluting stent    ULTRASOUND GUIDANCE FOR VASCULAR ACCESS  12/15/2017   Procedure: Ultrasound Guidance For Vascular Access;  Surgeon: [name], MD;  Location: [place] CV LAB;  Service: Cardiovascular;;   Social History   Social History Narrative   Patient is right handed.   Patient does not drink caffeine.   family history includes Cancer in his maternal grandmother; Coronary artery disease in his brother and brother; Diabetes in his brother, brother, and mother; Heart attack in an other family member; Heart failure in his father and mother; Hypothyroidism in his brother; Irregular heart beat in his daughter; Other in his brother; Pancreatic cancer in his brother; Stroke in his brother.   Review of Systems As per HPI.  Blood sugars have been up a little bit.  He has restless leg as well.  Otherwise negative.

## 2019-05-07 NOTE — Patient Instructions (Addendum)
As we discussed, the dark black stools suggest you were having internal bleeding.  This needs to be investigated.  The abnormal swallowing or dysphagia needs to be investigated also.  You will need to hold your Eliquis 2 days prior to your procedures which will include an upper GI endoscopy and a colonoscopy.  I may need to dilate the esophagus or stretch it.   We will confirm with Dr. Dreama Saa that holding Eliquis 2 days prior to the procedures is acceptable.  I will anticipate restarting the Eliquis to soon as safe after your procedures.  We will also modify how you take your insulin while you prep for your colonoscopy.   I appreciate the opportunity to care for you. Gatha Mayer, MD, [facility]

## 2019-05-08 ENCOUNTER — Telehealth: Payer: Self-pay

## 2019-05-08 NOTE — Telephone Encounter (Signed)
Patient informed and verbalized understanding holding his Eliquis for 2 days prior to his procedure.

## 2019-05-08 NOTE — Telephone Encounter (Signed)
PA initiated today through Cover My Meds for Novolog FlexPen. Will await insurance response re: approval/denial.  CAYDENCE ENCK (Key: [id number]) Rx #: [id number] NovoLOG FlexPen 100UNIT/ML pen-injectors   Form [employer] Electronic PA Form Created 4 days ago Sent to Plan 4 minutes ago Plan Response 4 minutes ago Submit Clinical Questions less than a minute ago Determination Wait for Determination Please wait for [facility] NCPDP 2017 to return a determination.

## 2019-05-08 NOTE — Telephone Encounter (Signed)
Received notification from [facility] that [name] [name] for [name] [name] has been approved until 12/06/19. Documents have been labeled and placed in scan file for HIM and for our future reference.

## 2019-05-08 NOTE — Telephone Encounter (Signed)
   Primary Cardiologist: Larae Grooms, MD  Chart revisited as part of pre-operative protocol coverage. I spoke with patient who affirms no interim change in cardiac status. His dyspnea is maybe even a little better since walking regularly. Given past medical history and time since last visit, based on ACC/AHA guidelines, Lavarr President Simcoe would be at acceptable risk for the planned procedure without further cardiovascular testing.   Per pharmD, "Ok to hold Eliquis for 2 days prior to procedure."  I will route this recommendation to the requesting party via [facility] fax function and remove from pre-op pool.  Please call with questions.  Charlie Pitter, PA-C 05/08/2019, 11:21 AM

## 2019-05-08 NOTE — Telephone Encounter (Signed)
Left message for patient to call me back. 

## 2019-05-08 NOTE — Telephone Encounter (Signed)
Pt takes Eliquis for afib with CHADS2VASc score of 5 (age, CHF, HTN, DM, CAD). SCr 1.47, CrCl is 23mL/min. Ok to hold Eliquis for 2 days prior to procedure.

## 2019-05-08 NOTE — Telephone Encounter (Signed)
   Primary Cardiologist: Larae Grooms, MD  Chart reviewed as part of pre-operative protocol coverage. Will route to pharm for input on Eliquis then pt will need call.  Charlie Pitter, PA-C 05/08/2019, 8:53 AM

## 2019-05-08 NOTE — Telephone Encounter (Signed)
Patient is returning your call.  

## 2019-05-10 ENCOUNTER — Other Ambulatory Visit: Payer: Self-pay

## 2019-05-10 ENCOUNTER — Ambulatory Visit (INDEPENDENT_AMBULATORY_CARE_PROVIDER_SITE_OTHER): Payer: Medicare PPO | Admitting: Family Medicine

## 2019-05-10 DIAGNOSIS — I10 Essential (primary) hypertension: Secondary | ICD-10-CM | POA: Diagnosis not present

## 2019-05-10 DIAGNOSIS — D649 Anemia, unspecified: Secondary | ICD-10-CM

## 2019-05-10 DIAGNOSIS — E034 Atrophy of thyroid (acquired): Secondary | ICD-10-CM | POA: Diagnosis not present

## 2019-05-10 DIAGNOSIS — Z79899 Other long term (current) drug therapy: Secondary | ICD-10-CM

## 2019-05-10 DIAGNOSIS — R350 Frequency of micturition: Secondary | ICD-10-CM | POA: Diagnosis not present

## 2019-05-10 DIAGNOSIS — I4821 Permanent atrial fibrillation: Secondary | ICD-10-CM

## 2019-05-10 DIAGNOSIS — E782 Mixed hyperlipidemia: Secondary | ICD-10-CM

## 2019-05-10 DIAGNOSIS — E1165 Type 2 diabetes mellitus with hyperglycemia: Secondary | ICD-10-CM

## 2019-05-10 MED FILL — ATORVASTATIN 80 MG TABLET: 80 | 90 days supply | Qty: 90 | Fill #1

## 2019-05-10 MED FILL — FUROSEMIDE 20 MG TAB: 20 | 30 days supply | Qty: 30 | Fill #2

## 2019-05-10 MED FILL — MIDODRINE HCL 10 MG TABLET: 10 | 30 days supply | Qty: 90 | Fill #6

## 2019-05-11 ENCOUNTER — Other Ambulatory Visit: Payer: Self-pay | Admitting: Family Medicine

## 2019-05-11 MED FILL — RANOLAZINE ER 500 MG TABLET: 500 | 30 days supply | Qty: 60 | Fill #0

## 2019-05-11 MED FILL — LEVOTHYROXINE 100 MCG TAB: 100 | 90 days supply | Qty: 90 | Fill #1

## 2019-05-11 MED FILL — ISOSORBIDE MN ER 60 MG TAB: 60 | 90 days supply | Qty: 180 | Fill #0

## 2019-05-13 DIAGNOSIS — R350 Frequency of micturition: Secondary | ICD-10-CM | POA: Insufficient documentation

## 2019-05-13 NOTE — Assessment & Plan Note (Signed)
Tolerating statin, encouraged heart healthy diet, avoid trans fats, minimize simple carbs and saturated fats. Increase exercise as tolerated 

## 2019-05-13 NOTE — Assessment & Plan Note (Signed)
Following with endocrinology and numbers are improving. Generally in the 140s to 150s range although he has had some low numbers using regular insulin 70, 70 and 100 with with meals and NPH 72 units at bedtime. Will check hgba1c

## 2019-05-13 NOTE — Assessment & Plan Note (Signed)
On Levothyroxine, continue to monitor 

## 2019-05-13 NOTE — Assessment & Plan Note (Signed)
Well controlled, no changes to meds. Encouraged heart healthy diet such as the DASH diet and exercise as tolerated.  °

## 2019-05-13 NOTE — Progress Notes (Signed)
Virtual Visit via Telephone Note  I connected with [name] [name] on  05/10/2019 at  8:40 AM EDT by a phone enabled telemedicine application and verified that I am speaking with the correct person using two identifiers.  Location: Patient: home Provider: home   I discussed the limitations of evaluation and management by telemedicine and the availability of in person appointments. The patient expressed understanding and agreed to proceed. [name] [name], CMA was able to get patient set up on telephone call when video visit ws not viable.    Subjective:    Patient ID: [name] [name], male    DOB: [birth year], 73 y.o.   MRN: [id number]  No chief complaint on file.   HPI Patient is in today for follow up on chronic medical concerns including, diabetes, hyperlipidemia, CVD and more. He continues to struggle with fatigue and he notes episodes of weakness and light headedness especially upon arising quickly. No recent febrile illness or hospitalizations. No polyuria or polysipsia. Denies CP/palp/SOB/HA/congestion/fevers/GI or GU c/o. Taking meds as prescribed  Past Medical History:  Diagnosis Date  . Acquired atrophy of thyroid 06/05/2013   Overview:  July 2014: controlled on synthroid 46mcg since 2012 May 2015: decreased to Synthroid 50 mcg  Last Assessment & Plan:  Pt doing well with most recent TSH within normal range.  Will continue current dosage of Synthroid 65mcg. Pt reminded to take medication on empty stomach. Will continue to monitor with periodic laboratory assessment in 3 months.   . Anemia    hemoglobin 7.4, iron deficiency, January, 2011, 2 unit transfusion, endoscopy normal, capsule endoscopy February, 2011 no small bowel abnormalities.   Most likely source gastric erosions, followed by GI  . Anxiety   . Bradycardia   . CAD (coronary artery disease)    A. CABG in 2000,status post cardiac cath in 2006, 2009 ....continued chest pain and SOB despite oral medication adjestments  including Ranexa. B. Cath November 2009/ mRCA - 2.75 x 23 Abbott Xience V drug-eluting stent ...11/26/2008 to distal  RCA leading to acute marginal.  C. Cath 07/2012 for CP - stable anatomy, med rx. d. cath 2015 and 05/30/2015 stable anatomy, consider Myoview if has CP again  . Carotid artery disease ([place]) 08/01/2015   Doppler, May 29, 2015, 1-39% bilateral ICA   . Cerebral ischemia    MRI November, 2010, chronic microvascular ischemia  . CKD (chronic kidney disease), stage III ([place])   . Concussion   . Depression    Bipolar  . Diabetes mellitus ([place])   . Edema   . Essential hypertension 06/05/2013   Overview:  July 2014: Controlled with Bystolic Sep 7341: Changed to atenolol.  Last Assessment & Plan:  Will change to atenolol for cost and follow Improved.  Medication compliance strongly encouraged BP: 116/64 mmHg    Overview:  July 2014: Controlled with Bystolic Sep 9379: Changed to atenolol.  Last Assessment & Plan:  Will change to atenolol for cost and follow  . Falling episodes    these have occurred in the past and again recurring 2011  . Family history of adverse reaction to anesthesia    "mother died during bypass surgery but not sure if it has to do with anesthesia"  . Gastric ulcer   . GERD (gastroesophageal reflux disease)   . H/O medication noncompliance    Due to loss of insurance  . H/O multiple concussions   . Hard of hearing   . History of blood transfusion 12/20/2013  . Hx  of CABG    2000,  / one median sternotomy suture broken her chest x-ray November, 2010, no clinical significance  . Hyperlipidemia   . Hypertension    pt. denies  . Iron deficiency anemia   . Long term (current) use of anticoagulants [Z79.01] 07/20/2016  . Low back pain 06/12/2009   Qualifier: Diagnosis of  By: [name]   . Nephrolithiasis   . Orthostasis   . OSA (obstructive sleep apnea)   . PAF (paroxysmal atrial fibrillation) ([place])    a. dx 2017, started on amiodarone/warfarin but patient  intermittently noncompliant.  Marland Kitchen PSVT (paroxysmal supraventricular tachycardia) ([place])   . RBBB 07/09/2009   Qualifier: Diagnosis of  By: [name], MD, [name] [name]   . RLS (restless legs syndrome) 09/19/2009   Qualifier: Diagnosis of  By: [name]   Overview:  July 2014: Controlled with Mirapex  Last Assessment & Plan:  Patient is doing well. Will continue current management and follow clinically.  . Seizure disorder ([place]) 01/09/2017  . Spondylosis    C5-6, C6-7 MRI 2010  . Syncope 03/2016  . Thyroid disease   . Tubulovillous adenoma of colon 2007  . Vitamin D deficiency 05/17/2017  . Wears glasses     Past Surgical History:  Procedure Laterality Date  . ANTERIOR CERVICAL DECOMP/DISCECTOMY FUSION N/A 05/31/2016   Procedure: ANTERIOR CERVICAL DECOMPRESSION/DISCECTOMY FUSION CERVICAL FIVE-SIX,CERVICAL SIX-SEVEN;  Surgeon: [name], MD;  Location: [facility] NEURO ORS;  Service: Neurosurgery;  Laterality: N/A;  . CARDIAC CATHETERIZATION N/A 05/30/2015   Procedure: Left Heart Cath and Coronary Angiography;  Surgeon: [name], MD;  Location: [place] CV LAB;  Service: Cardiovascular;  Laterality: N/A;  . COLONOSCOPY    . CORONARY ARTERY BYPASS GRAFT     2000  . ESOPHAGOGASTRODUODENOSCOPY    . LEFT HEART CATH AND CORS/GRAFTS ANGIOGRAPHY N/A 12/15/2017   Procedure: LEFT HEART CATH AND CORS/GRAFTS ANGIOGRAPHY;  Surgeon: [name], MD;  Location: [place] CV LAB;  Service: Cardiovascular;  Laterality: N/A;  . LEFT HEART CATHETERIZATION WITH CORONARY/GRAFT ANGIOGRAM N/A 08/01/2012   Procedure: LEFT HEART CATHETERIZATION WITH Beatrix Fetters;  Surgeon: [name], MD;  Location: [facility] CATH LAB;  Service: Cardiovascular;  Laterality: N/A;  . LEFT HEART CATHETERIZATION WITH CORONARY/GRAFT ANGIOGRAM N/A 01/03/2015   Procedure: LEFT HEART CATHETERIZATION WITH Beatrix Fetters;  Surgeon: [name], MD;  Location: [facility] CATH LAB;  Service: Cardiovascular;   Laterality: N/A;  . NASAL SEPTUM SURGERY     UP3  . PERCUTANEOUS CORONARY STENT INTERVENTION (PCI-S)  10/2008   mRCA PCI  2.75 x 23 Abbott Xience V drug-eluting stent   . ULTRASOUND GUIDANCE FOR VASCULAR ACCESS  12/15/2017   Procedure: Ultrasound Guidance For Vascular Access;  Surgeon: [name], MD;  Location: [place] CV LAB;  Service: Cardiovascular;;    Family History  Problem Relation Age of Onset  . Pancreatic cancer Brother   . Diabetes Brother   . Coronary artery disease Brother   . Stroke Brother   . Diabetes Brother   . Diabetes Mother   . Heart failure Mother   . Heart failure Father   . Hypothyroidism Brother   . Coronary artery disease Brother   . Other Brother        colon surgery  . Heart attack Other        Nephew  . Irregular heart beat Daughter   . Cancer Maternal Grandmother  unknown   . Colon cancer Neg Hx   . Stomach cancer Neg Hx   . Liver cancer Neg Hx   . Rectal cancer Neg Hx   . Esophageal cancer Neg Hx     Social History   Socioeconomic History  . Marital status: Married    Spouse name: Not on file  . Number of children: 4  . Years of education: 7  . Highest education level: Not on file  Occupational History  . Occupation: retired  Scientific laboratory technician  . Financial resource strain: Not on file  . Food insecurity:    Worry: Not on file    Inability: Not on file  . Transportation needs:    Medical: Not on file    Non-medical: Not on file  Tobacco Use  . Smoking status: Never Smoker  . Smokeless tobacco: Never Used  Substance and Sexual Activity  . Alcohol use: No    Alcohol/week: 0.0 standard drinks    Comment: stopped drinking in 1998  . Drug use: No  . Sexual activity: Not on file  Lifestyle  . Physical activity:    Days per week: Not on file    Minutes per session: Not on file  . Stress: Not on file  Relationships  . Social connections:    Talks on phone: Not on file    Gets together: Not on file    Attends  religious service: Not on file    Active member of club or organization: Not on file    Attends meetings of clubs or organizations: Not on file    Relationship status: Not on file  . Intimate partner violence:    Fear of current or ex partner: Not on file    Emotionally abused: Not on file    Physically abused: Not on file    Forced sexual activity: Not on file  Other Topics Concern  . Not on file  Social History Narrative   Patient is right handed.   Patient does not drink caffeine.    Outpatient Medications Prior to Visit  Medication Sig Dispense Refill  . apixaban (ELIQUIS) 5 MG TABS tablet Take 1 tablet (5 mg total) by mouth 2 (two) times daily. 60 tablet 5  . atorvastatin (LIPITOR) 80 MG tablet Take 1 tablet (80 mg total) by mouth daily. 90 tablet 3  . diclofenac sodium (VOLTAREN) 1 % GEL Apply 2 g topically daily as needed (for pain in affected area). 2 Tube 0  . divalproex (DEPAKOTE ER) 250 MG 24 hr tablet Take 3 tablets (750 mg total) by mouth at bedtime. 180 tablet 0  . furosemide (LASIX) 20 MG tablet Take 1 tablet (20 mg total) by mouth daily. 30 tablet 0  . gabapentin (NEURONTIN) 100 MG capsule 1 capsule in the morning, 3 in the evening 120 capsule 3  . glucose blood (ACCU-CHEK AVIVA) test strip Use as directed to check blood sugar 4 times daily.  DX E11.9 200 each 6  . insulin aspart (NOVOLOG FLEXPEN) 100 UNIT/ML FlexPen 65-85 with breakfast, 70 with lunch, and 100 with supper, and pen needles 3/day 20 pen 0  . insulin NPH Human (NOVOLIN N) 100 UNIT/ML injection Inject 0.6 mLs (60 Units total) into the skin at bedtime. And syringes 1/day 130 mL 0  . levothyroxine (SYNTHROID, LEVOTHROID) 100 MCG tablet Take 1 tablet (100 mcg total) by mouth daily. 90 tablet 1  . loperamide (IMODIUM A-D) 2 MG tablet Take 1 tablet (2 mg total) by mouth  4 (four) times daily as needed for diarrhea or loose stools. 30 tablet 0  . metoprolol succinate (TOPROL-XL) 50 MG 24 hr tablet TAKE 1&1/2  TABLETS BY MOUTH ONCE A DAY FOR A TOTAL OF 75 MG. 135 tablet 3  . midodrine (PROAMATINE) 10 MG tablet Take 20 mg by mouth daily.    . nitroGLYCERIN (NITROSTAT) 0.4 MG SL tablet Place 1 tablet (0.4 mg total) under the tongue every 5 (five) minutes as needed for chest pain. 25 tablet 1  . pantoprazole (PROTONIX) 40 MG tablet Take 1 tablet (40 mg total) by mouth daily. 90 tablet 0  . pramipexole (MIRAPEX) 1 MG tablet Take 0.5-1 tablets (0.5-1 mg total) by mouth at bedtime. 90 tablet 0  . sertraline (ZOLOFT) 100 MG tablet TAKE 1 TABLET (100 MG TOTAL) BY MOUTH 2 (TWO) TIMES DAILY. 180 tablet 1  . tiZANidine (ZANAFLEX) 4 MG tablet Take 1 tablet (4 mg total) by mouth every 6 (six) hours as needed for muscle spasms. 40 tablet 2  . Vitamin D, Ergocalciferol, (DRISDOL) 1.25 MG (50000 UT) CAPS capsule Take 1 capsule (50,000 Units total) by mouth every 7 (seven) days. 20 capsule 0  . isosorbide mononitrate (IMDUR) 60 MG 24 hr tablet Take 2 tablets (120 mg total) by mouth daily. 180 tablet 0  . ranolazine (RANEXA) 500 MG 12 hr tablet TAKE 1 TABLET (500 MG TOTAL) BY MOUTH 2 (TWO) TIMES DAILY. 60 tablet 0   No facility-administered medications prior to visit.     Allergies  Allergen Reactions  . Morphine Other (See Comments)    hallucinations  . Shellfish-Derived Products Itching    Review of Systems  Constitutional: Positive for malaise/fatigue. Negative for fever.  HENT: Negative for congestion.   Eyes: Negative for blurred vision.  Respiratory: Negative for shortness of breath.   Cardiovascular: Negative for chest pain, palpitations and leg swelling.  Gastrointestinal: Negative for abdominal pain, blood in stool and nausea.  Genitourinary: Positive for frequency and urgency. Negative for dysuria, flank pain and hematuria.  Musculoskeletal: Negative for falls.  Skin: Negative for rash.  Neurological: Positive for dizziness. Negative for loss of consciousness and headaches.  Endo/Heme/Allergies:  Negative for environmental allergies.  Psychiatric/Behavioral: Negative for depression. The patient is not nervous/anxious.        Objective:    Physical Exam unable to obtain via telephone visit.   There were no vitals taken for this visit. Wt Readings from Last 3 Encounters:  05/07/19 207 lb (93.9 kg)  05/04/19 212 lb 9.6 oz (96.4 kg)  04/25/19 207 lb (93.9 kg)    Diabetic Foot Exam - Simple   No data filed     Lab Results  Component Value Date   WBC 8.3 04/10/2019   HGB 14.4 04/10/2019   HCT 40.7 04/10/2019   PLT 184.0 04/10/2019   GLUCOSE 336 (H) 02/05/2019   CHOL 168 02/05/2019   TRIG 358.0 (H) 02/05/2019   HDL 30.00 (L) 02/05/2019   LDLDIRECT 107.0 02/05/2019   LDLCALC 32 07/17/2018   ALT 15 02/05/2019   AST 13 02/05/2019   NA 135 02/05/2019   K 4.2 02/05/2019   CL 99 02/05/2019   CREATININE 1.47 02/05/2019   BUN 22 02/05/2019   CO2 28 02/05/2019   TSH 5.060 (H) 02/01/2019   PSA 2.39 11/25/2010   INR 2.7 04/23/2019   HGBA1C 8.8 (A) 05/04/2019   MICROALBUR 1.1 05/01/2015    Lab Results  Component Value Date   TSH 5.060 (H) 02/01/2019  Lab Results  Component Value Date   WBC 8.3 04/10/2019   HGB 14.4 04/10/2019   HCT 40.7 04/10/2019   MCV 90.6 04/10/2019   PLT 184.0 04/10/2019   Lab Results  Component Value Date   NA 135 02/05/2019   K 4.2 02/05/2019   CO2 28 02/05/2019   GLUCOSE 336 (H) 02/05/2019   BUN 22 02/05/2019   CREATININE 1.47 02/05/2019   BILITOT 0.5 02/05/2019   ALKPHOS 90 02/05/2019   AST 13 02/05/2019   ALT 15 02/05/2019   PROT 7.0 02/05/2019   ALBUMIN 4.4 02/05/2019   CALCIUM 9.3 02/05/2019   ANIONGAP 11 11/07/2018   GFR 47.30 (L) 02/05/2019   Lab Results  Component Value Date   CHOL 168 02/05/2019   Lab Results  Component Value Date   HDL 30.00 (L) 02/05/2019   Lab Results  Component Value Date   LDLCALC 32 07/17/2018   Lab Results  Component Value Date   TRIG 358.0 (H) 02/05/2019   Lab Results   Component Value Date   CHOLHDL 6 02/05/2019   Lab Results  Component Value Date   HGBA1C 8.8 (A) 05/04/2019       Assessment & Plan:   Problem List Items Addressed This Visit    Diabetes mellitus type II, uncontrolled ([place]) (Chronic)    Following with endocrinology and numbers are improving. Generally in the 140s to 150s range although he has had some low numbers using regular insulin 70, 70 and 100 with with meals and NPH 72 units at bedtime. Will check hgba1c       Relevant Orders   CBC with Differential/Platelet   Hemoglobin A1c   Anemia - Primary   Relevant Orders   Ferritin   Microalbumin / creatinine urine ratio   Essential hypertension    Well controlled, no changes to meds. Encouraged heart healthy diet such as the DASH diet and exercise as tolerated.       Relevant Orders   CBC with Differential/Platelet   Comprehensive metabolic panel   TSH   HLD (hyperlipidemia)    Tolerating statin, encouraged heart healthy diet, avoid trans fats, minimize simple carbs and saturated fats. Increase exercise as tolerated      Relevant Orders   Lipid panel   Atrial fibrillation ([place]) [I48.91]    Asymptomatic, tolerating meds.      Hypothyroidism due to acquired atrophy of thyroid    On Levothyroxine, continue to monitor      Relevant Orders   TSH   T4, free   Frequency of urination    Worsening recently, check UA and culture.      Relevant Orders   Microalbumin / creatinine urine ratio   Urine Culture   Urinalysis    Other Visit Diagnoses    High risk medication use       Relevant Orders   Pain Mgmt, Profile 8 w/Conf, U      I am having Ryane A. Pitter maintain his loperamide, diclofenac sodium, divalproex, nitroGLYCERIN, Vitamin D (Ergocalciferol), sertraline, midodrine, glucose blood, levothyroxine, tiZANidine, atorvastatin, metoprolol succinate, pantoprazole, pramipexole, furosemide, gabapentin, apixaban, insulin NPH Human, and insulin aspart.  No  orders of the defined types were placed in this encounter.  I discussed the assessment and treatment plan with the patient. The patient was provided an opportunity to ask questions and all were answered. The patient agreed with the plan and demonstrated an understanding of the instructions.   The patient was advised to call back or seek  an in-person evaluation if the symptoms worsen or if the condition fails to improve as anticipated.  I provided 25 minutes of non-face-to-face time during this encounter.   Penni Homans, MD

## 2019-05-13 NOTE — Assessment & Plan Note (Signed)
Asymptomatic, tolerating meds.

## 2019-05-13 NOTE — Assessment & Plan Note (Signed)
Worsening recently, check UA and culture.

## 2019-05-14 ENCOUNTER — Other Ambulatory Visit: Payer: Self-pay

## 2019-05-14 ENCOUNTER — Other Ambulatory Visit (INDEPENDENT_AMBULATORY_CARE_PROVIDER_SITE_OTHER): Payer: Medicare PPO

## 2019-05-14 ENCOUNTER — Telehealth: Payer: Self-pay | Admitting: *Deleted

## 2019-05-14 DIAGNOSIS — D649 Anemia, unspecified: Secondary | ICD-10-CM | POA: Diagnosis not present

## 2019-05-14 DIAGNOSIS — I10 Essential (primary) hypertension: Secondary | ICD-10-CM

## 2019-05-14 DIAGNOSIS — E782 Mixed hyperlipidemia: Secondary | ICD-10-CM | POA: Diagnosis not present

## 2019-05-14 DIAGNOSIS — E034 Atrophy of thyroid (acquired): Secondary | ICD-10-CM | POA: Diagnosis not present

## 2019-05-14 DIAGNOSIS — R350 Frequency of micturition: Secondary | ICD-10-CM | POA: Diagnosis not present

## 2019-05-14 DIAGNOSIS — Z79899 Other long term (current) drug therapy: Secondary | ICD-10-CM | POA: Diagnosis not present

## 2019-05-14 DIAGNOSIS — E1165 Type 2 diabetes mellitus with hyperglycemia: Secondary | ICD-10-CM

## 2019-05-14 LAB — LIPID PANEL
Cholesterol: 166 mg/dL (ref 0–200)
HDL: 29.6 mg/dL — ABNORMAL LOW (ref >39.00)
NonHDL: 136.69
Total CHOL/HDL Ratio: 6
Triglycerides: 288 mg/dL — ABNORMAL HIGH (ref 0.0–149.0)
VLDL: 57.6 mg/dL — ABNORMAL HIGH (ref 0.0–40.0)

## 2019-05-14 LAB — CBC WITH DIFFERENTIAL/PLATELET
Basophils Absolute: 0 10*3/uL (ref 0.0–0.1)
Basophils Relative: 0.4 % (ref 0.0–3.0)
Eosinophils Absolute: 0.2 10*3/uL (ref 0.0–0.7)
Eosinophils Relative: 2.4 % (ref 0.0–5.0)
HCT: 38.4 % — ABNORMAL LOW (ref 39.0–52.0)
Hemoglobin: 13.4 g/dL (ref 13.0–17.0)
Lymphocytes Relative: 22.4 % (ref 12.0–46.0)
Lymphs Abs: 1.7 10*3/uL (ref 0.7–4.0)
MCHC: 34.8 g/dL (ref 30.0–36.0)
MCV: 91.8 fl (ref 78.0–100.0)
Monocytes Absolute: 0.5 10*3/uL (ref 0.1–1.0)
Monocytes Relative: 6 % (ref 3.0–12.0)
Neutro Abs: 5.2 10*3/uL (ref 1.4–7.7)
Neutrophils Relative %: 68.8 % (ref 43.0–77.0)
Platelets: 176 10*3/uL (ref 150.0–400.0)
RBC: 4.19 Mil/uL — ABNORMAL LOW (ref 4.22–5.81)
RDW: 14.2 % (ref 11.5–15.5)
WBC: 7.6 10*3/uL (ref 4.0–10.5)

## 2019-05-14 LAB — COMPREHENSIVE METABOLIC PANEL
ALT: 25 U/L (ref 0–53)
AST: 16 U/L (ref 0–37)
Albumin: 4.3 g/dL (ref 3.5–5.2)
Alkaline Phosphatase: 97 U/L (ref 39–117)
BUN: 21 mg/dL (ref 6–23)
CO2: 24 mEq/L (ref 19–32)
Calcium: 9.2 mg/dL (ref 8.4–10.5)
Chloride: 103 mEq/L (ref 96–112)
Creatinine, Ser: 1.64 mg/dL — ABNORMAL HIGH (ref 0.40–1.50)
GFR: 41.66 mL/min — ABNORMAL LOW (ref >60.00)
Glucose, Bld: 358 mg/dL — ABNORMAL HIGH (ref 70–99)
Potassium: 4.8 mEq/L (ref 3.5–5.1)
Sodium: 136 mEq/L (ref 135–145)
Total Bilirubin: 0.7 mg/dL (ref 0.2–1.2)
Total Protein: 6.8 g/dL (ref 6.0–8.3)

## 2019-05-14 LAB — LDL CHOLESTEROL, DIRECT: Direct LDL: 111 mg/dL

## 2019-05-14 LAB — T4, FREE: Free T4: 0.97 ng/dL (ref 0.60–1.60)

## 2019-05-14 LAB — FERRITIN: Ferritin: 27.3 ng/mL (ref 22.0–322.0)

## 2019-05-14 LAB — HEMOGLOBIN A1C: Hgb A1c MFr Bld: 9.4 % — ABNORMAL HIGH (ref 4.6–6.5)

## 2019-05-14 LAB — TSH: TSH: 2.58 u[IU]/mL (ref 0.35–4.50)

## 2019-05-14 NOTE — Telephone Encounter (Signed)
Covid-19 screening questions  Have you traveled in the last 14 days? no If yes where?  Do you now or have you had a fever in the last 14 days? no  Do you have any respiratory symptoms of shortness of breath or cough now or in the last 14 days? no  Do you have any family members or close contacts with diagnosed or suspected Covid-19 in the past 14 days? no  Have you been tested for Covid-19 and found to be positive? No  Pt is aware that care partner will wait in the car during parking lot; if they feel like they will be too hot to wait in the car; they may wait in the lobby.  We want them to wear a mask (we do not have any that we can provide them), practice social distancing, and we will check their temperatures when they get here.  I did remind patient that their care partner needs to stay in the parking lot the entire time. Pt will wear mask into building         

## 2019-05-14 NOTE — Telephone Encounter (Signed)
Message left

## 2019-05-14 NOTE — Telephone Encounter (Signed)
Patient is returning your call.  

## 2019-05-15 ENCOUNTER — Telehealth: Payer: Self-pay | Admitting: *Deleted

## 2019-05-15 LAB — URINALYSIS
Bilirubin Urine: NEGATIVE
Hgb urine dipstick: NEGATIVE
Ketones, ur: NEGATIVE
Leukocytes,Ua: NEGATIVE
Nitrite: NEGATIVE
Specific Gravity, Urine: 1.02 (ref 1.000–1.030)
Total Protein, Urine: NEGATIVE
Urine Glucose: >=1000 — AB
Urobilinogen, UA: 0.2 (ref 0.0–1.0)
pH: 5.5 (ref 5.0–8.0)

## 2019-05-15 LAB — MICROALBUMIN / CREATININE URINE RATIO
Creatinine,U: 80.9 mg/dL
Microalb Creat Ratio: 0.9 mg/g (ref 0.0–30.0)
Microalb, Ur: <0.7 mg/dL (ref 0.0–1.9)

## 2019-05-16 ENCOUNTER — Ambulatory Visit (AMBULATORY_SURGERY_CENTER): Payer: Medicare PPO | Admitting: Internal Medicine

## 2019-05-16 ENCOUNTER — Encounter: Payer: Self-pay | Admitting: Internal Medicine

## 2019-05-16 ENCOUNTER — Telehealth: Payer: Self-pay | Admitting: Internal Medicine

## 2019-05-16 ENCOUNTER — Other Ambulatory Visit: Payer: Self-pay

## 2019-05-16 VITALS — BP 110/56 | HR 56 | Temp 97.6°F | Resp 19 | Ht 68.0 in | Wt 207.0 lb

## 2019-05-16 DIAGNOSIS — K921 Melena: Secondary | ICD-10-CM

## 2019-05-16 DIAGNOSIS — D123 Benign neoplasm of transverse colon: Secondary | ICD-10-CM | POA: Diagnosis not present

## 2019-05-16 DIAGNOSIS — D120 Benign neoplasm of cecum: Secondary | ICD-10-CM

## 2019-05-16 DIAGNOSIS — K222 Esophageal obstruction: Secondary | ICD-10-CM

## 2019-05-16 DIAGNOSIS — R1310 Dysphagia, unspecified: Secondary | ICD-10-CM | POA: Diagnosis not present

## 2019-05-16 DIAGNOSIS — D128 Benign neoplasm of rectum: Secondary | ICD-10-CM

## 2019-05-16 LAB — PAIN MGMT, PROFILE 8 W/CONF, U
6 Acetylmorphine: NEGATIVE ng/mL
Alcohol Metabolites: NEGATIVE ng/mL (ref <500)
Amphetamines: NEGATIVE ng/mL
Benzodiazepines: NEGATIVE ng/mL
Buprenorphine, Urine: NEGATIVE ng/mL
Cocaine Metabolite: NEGATIVE ng/mL
Creatinine: 82 mg/dL
MDMA: NEGATIVE ng/mL
Marijuana Metabolite: NEGATIVE ng/mL
Opiates: NEGATIVE ng/mL
Oxidant: NEGATIVE ug/mL
Oxycodone: NEGATIVE ng/mL
pH: 5.2 (ref 4.5–9.0)

## 2019-05-16 LAB — URINE CULTURE
MICRO NUMBER:: 546596
Result:: NO GROWTH
SPECIMEN QUALITY:: ADEQUATE

## 2019-05-16 MED ORDER — SODIUM CHLORIDE 0.9 % IV SOLN: 500.0000 mL | INTRAVENOUS | Status: DC

## 2019-05-16 MED ORDER — FERROUS SULFATE 325 (65 FE) MG PO TABS: 325.0000 mg | ORAL_TABLET | ORAL | 3 refills | Status: DC

## 2019-05-16 NOTE — Telephone Encounter (Signed)
Pt's blood sugar is about 200- he is asking if he should take a "little bit of his short term insulin."  I told him not to- we are ok with that blood sugar and will check him when gets here

## 2019-05-16 NOTE — Op Note (Signed)
[facility] Patient Name: [name] [name] Procedure Date: 05/16/2019 1:01 PM MRN: [id number] Endoscopist: Gatha Mayer , MD Age: 73 Referring MD:  Date of Birth: [birth year] Gender: Male Account #: [id number] Procedure:                Colonoscopy Indications:              Melena Medicines:                Propofol per Anesthesia, Monitored Anesthesia Care Procedure:                Pre-Anesthesia Assessment:                           - Prior to the procedure, a History and Physical                            was performed, and patient medications and                            allergies were reviewed. The patient's tolerance of                            previous anesthesia was also reviewed. The risks                            and benefits of the procedure and the sedation                            options and risks were discussed with the patient.                            All questions were answered, and informed consent                            was obtained. Prior Anticoagulants: The patient                            last took Eliquis (apixaban) 2 days prior to the                            procedure. ASA Grade Assessment: III - A patient                            with severe systemic disease. After reviewing the                            risks and benefits, the patient was deemed in                            satisfactory condition to undergo the procedure.                           After obtaining informed consent, the colonoscope  was passed under direct vision. Throughout the                            procedure, the patient's blood pressure, pulse, and                            oxygen saturations were monitored continuously. The                            Colonoscope was introduced through the anus and                            advanced to the the terminal ileum, with                            identification of the appendiceal  orifice and IC                            valve. The colonoscopy was performed without                            difficulty. The patient tolerated the procedure                            well. The quality of the bowel preparation was                            good. The terminal ileum, ileocecal valve,                            appendiceal orifice, and rectum were photographed.                            The bowel preparation used was Miralax via split                            dose instruction. Scope In: 1:25:16 PM Scope Out: 1:43:21 PM Scope Withdrawal Time: 0 hours 11 minutes 40 seconds  Total Procedure Duration: 0 hours 18 minutes 5 seconds  Findings:                 The perianal and digital rectal examinations were                            normal. Pertinent negatives include normal prostate                            (size, shape, and consistency).                           Three sessile polyps were found in the rectum,                            transverse colon and cecum. The polyps were  diminutive in size. These polyps were removed with                            a cold biopsy forceps. Resection and retrieval were                            complete. Verification of patient identification                            for the specimen was done. Estimated blood loss was                            minimal.                           The terminal ileum appeared normal.                           The exam was otherwise without abnormality on                            direct and retroflexion views. Complications:            No immediate complications. Estimated Blood Loss:     Estimated blood loss was minimal. Impression:               - Three diminutive polyps in the rectum, in the                            transverse colon and in the cecum, removed with a                            cold biopsy forceps. Resected and retrieved.                            - The examined portion of the ileum was normal.                           - The examination was otherwise normal on direct                            and retroflexion views. Recommendation:           - Patient has a contact number available for                            emergencies. The signs and symptoms of potential                            delayed complications were discussed with the                            patient. Return to normal activities tomorrow.  Written discharge instructions were provided to the                            patient.                           - Clear liquids x 1 hour then soft foods rest of                            day. Start prior diet tomorrow.                           - Continue present medications.                           - Resume Eliquis (apixaban) at prior dose tomorrow.                           - Repeat colonoscopy is recommended. The                            colonoscopy date will be determined after pathology                            results from today's exam become available for                            review.                           - NO CAUSE OF SUSPECTED MELENA SEEN                           I DO RECOMMEND FERROUS SULFATE 325 MG QOD AS                            FERRITIN IS LOW NL AND RAISING ABOVE 100 WILL HELP                            RESTLESS LEG Gatha Mayer, MD 05/16/2019 2:03:36 PM This report has been signed electronically.

## 2019-05-16 NOTE — Progress Notes (Signed)
Called to room to assist during endoscopic procedure.  Patient ID and intended procedure confirmed with present staff. Received instructions for my participation in the procedure from the performing physician.  

## 2019-05-16 NOTE — Patient Instructions (Addendum)
I dilated the esophagus to help you swallow better - there was what we call a ring where esophagus and stomach meet. That is where food was likely hanging up. It should fix things but if not let me know.  Three tiny colon polyps removed. All else ok.  I do not know why you had the black stools. If that is a problem again let me know.  I think you should get ferrous sulfate 325 mg and take every other day. This will build up iron levels and actually improve the restless leg - or at least its supposed to.  I will let you know polyp pathology results and when to have another routine colonoscopy by mail and/or My Chart.  I appreciate the opportunity to care for you. Gatha Mayer, MD, FACG   YOU HAD AN ENDOSCOPIC PROCEDURE TODAY AT [facility]:   Refer to the procedure report that was given to you for any specific questions about what was found during the examination.  If the procedure report does not answer your questions, please call your gastroenterologist to clarify.  If you requested that your care partner not be given the details of your procedure findings, then the procedure report has been included in a sealed envelope for you to review at your convenience later.  YOU SHOULD EXPECT: Some feelings of bloating in the abdomen. Passage of more gas than usual.  Walking can help get rid of the air that was put into your GI tract during the procedure and reduce the bloating. If you had a lower endoscopy (such as a colonoscopy or flexible sigmoidoscopy) you may notice spotting of blood in your stool or on the toilet paper. If you underwent a bowel prep for your procedure, you may not have a normal bowel movement for a few days.  Please Note:  You might notice some irritation and congestion in your nose or some drainage.  This is from the oxygen used during your procedure.  There is no need for concern and it should clear up in a day or so.  SYMPTOMS TO REPORT IMMEDIATELY:    Following lower endoscopy (colonoscopy or flexible sigmoidoscopy):  Excessive amounts of blood in the stool  Significant tenderness or worsening of abdominal pains  Swelling of the abdomen that is new, acute  Fever of 100F or higher   Following upper endoscopy (EGD)  Vomiting of blood or coffee ground material  New chest pain or pain under the shoulder blades  Painful or persistently difficult swallowing  New shortness of breath  Fever of 100F or higher  Black, tarry-looking stools  For urgent or emergent issues, a gastroenterologist can be reached at any hour by calling [phone].   DIET:  We do recommend a small meal at first, but then you may proceed to your regular diet.  Drink plenty of fluids but you should avoid alcoholic beverages for 24 hours.  ACTIVITY:  You should plan to take it easy for the rest of today and you should NOT DRIVE or use heavy machinery until tomorrow (because of the sedation medicines used during the test).    FOLLOW UP: Our staff will call the number listed on your records 48-72 hours following your procedure to check on you and address any questions or concerns that you may have regarding the information given to you following your procedure. If we do not reach you, we will leave a message.  We will attempt to reach you two  times.  During this call, we will ask if you have developed any symptoms of COVID 19. If you develop any symptoms (ie: fever, flu-like symptoms, shortness of breath, cough etc.) before then, please call [phone].  If you test positive for Covid 19 in the 2 weeks post procedure, please call and report this information to [place].    If any biopsies were taken you will be contacted by phone or by letter within the next 1-3 weeks.  Please call us at [phone] if you have not heard about the biopsies in 3 weeks.    SIGNATURES/CONFIDENTIALITY: You and/or your care partner have signed paperwork which will be entered into your  electronic medical record.  These signatures attest to the fact that that the information above on your After Visit Summary has been reviewed and is understood.  Full responsibility of the confidentiality of this discharge information lies with you and/or your care-partner.

## 2019-05-16 NOTE — Op Note (Signed)
[place] Patient Name: [name] [name] Procedure Date: 05/16/2019 1:02 PM MRN: [id number] Endoscopist: Gatha Mayer , MD Age: 73 Referring MD:  Date of Birth: 1946/02/15 Gender: Male Account #: [id number] Procedure:                Upper GI endoscopy Indications:              Dysphagia, Melena Medicines:                Propofol per Anesthesia, Monitored Anesthesia Care Procedure:                Pre-Anesthesia Assessment:                           - Prior to the procedure, a History and Physical                            was performed, and patient medications and                            allergies were reviewed. The patient's tolerance of                            previous anesthesia was also reviewed. The risks                            and benefits of the procedure and the sedation                            options and risks were discussed with the patient.                            All questions were answered, and informed consent                            was obtained. Prior Anticoagulants: The patient                            last took Eliquis (apixaban) 2 days prior to the                            procedure. ASA Grade Assessment: III - A patient                            with severe systemic disease. After reviewing the                            risks and benefits, the patient was deemed in                            satisfactory condition to undergo the procedure.                           After obtaining informed consent, the endoscope was  passed under direct vision. Throughout the                            procedure, the patient's blood pressure, pulse, and                            oxygen saturations were monitored continuously. The                            Endoscope was introduced through the mouth, and                            advanced to the second part of duodenum. The upper                            GI  endoscopy was accomplished without difficulty.                            The patient tolerated the procedure well. Scope In: Scope Out: Findings:                 A mild Schatzki ring was found at the                            gastroesophageal junction. The scope was withdrawn.                            Dilation was performed with a Maloney dilator with                            mild resistance at 34 Fr. Estimated blood loss:                            none.                           A small amount of food (liquid residue) was found                            in the gastric body. removed - visualizition not                            impaired                           The exam was otherwise without abnormality.                           The cardia and gastric fundus were normal on                            retroflexion. Complications:            No immediate complications. Estimated Blood Loss:     Estimated blood loss: none. Impression:               -  Mild Schatzki ring. Dilated.                           - A small amount of food (residue) in the stomach.                           - The examination was otherwise normal.                           - No specimens collected. Recommendation:           - Patient has a contact number available for                            emergencies. The signs and symptoms of potential                            delayed complications were discussed with the                            patient. Return to normal activities tomorrow.                            Written discharge instructions were provided to the                            patient.                           - Clear liquids x 1 hour then soft foods rest of                            day. Start prior diet tomorrow.                           - See the other procedure note for documentation of                            additional recommendations. Gatha Mayer, MD 05/16/2019 1:58:10 PM This  report has been signed electronically.

## 2019-05-16 NOTE — Progress Notes (Signed)
To recovery stable comfortrable

## 2019-05-16 NOTE — Telephone Encounter (Signed)
Pt is scheduled today for 1:00 PM EGD/col--pt is diabetic and concerned about his blood sugar.  Please advise.

## 2019-05-18 ENCOUNTER — Telehealth: Payer: Self-pay | Admitting: *Deleted

## 2019-05-18 ENCOUNTER — Telehealth: Payer: Self-pay

## 2019-05-18 NOTE — Telephone Encounter (Signed)
  Follow up Call-  Call back number 05/16/2019  Post procedure Call Back phone  # [phone]  Permission to leave phone message Yes  Some recent data might be hidden   [facility]

## 2019-05-18 NOTE — Telephone Encounter (Signed)
  Follow up Call-  Call back number 05/16/2019  Post procedure Call Back phone  # [phone]  Permission to leave phone message Yes  Some recent data might be hidden     Patient questions:  Do you have a fever, pain , or abdominal swelling? No. Pain Score  0 *  Have you tolerated food without any problems? Yes.    Have you been able to return to your normal activities? Yes.    Do you have any questions about your discharge instructions: Diet   No. Medications  No. Follow up visit  No.  Do you have questions or concerns about your Care? No.  Actions: * If pain score is 4 or above: No action needed, pain <4.  1. Have you developed a fever since your procedure? no  2.   Have you had an respiratory symptoms (SOB or cough) since your procedure? no  3.   Have you tested positive for COVID 19 since your procedure no  4.   Have you had any family members/close contacts diagnosed with the COVID 19 since your procedure?  no   If yes to any of these questions please route to Joylene John, RN and Alphonsa Gin, Therapist, sports.

## 2019-05-22 ENCOUNTER — Encounter: Payer: Self-pay | Admitting: Internal Medicine

## 2019-05-22 MED FILL — NOVOLOG FLEXPEN SYRINGE: 100 | 8 days supply | Qty: 15 | Fill #1

## 2019-05-22 MED FILL — NovoLIN N 100 UNIT/ML SUSP: 100 | 13 days supply | Qty: 20 | Fill #1

## 2019-05-22 NOTE — Progress Notes (Signed)
Tubular adenoma traditional serrated adenoma Recall 3 yrs 2023 My Chart

## 2019-06-05 MED FILL — PRAMIPEXOLE 1 MG TABLET: 1 | 30 days supply | Qty: 30 | Fill #0

## 2019-06-05 MED FILL — ELIQUIS 5 MG TABLET: 5 | 30 days supply | Qty: 60 | Fill #1

## 2019-06-06 ENCOUNTER — Other Ambulatory Visit: Payer: Self-pay | Admitting: Family Medicine

## 2019-06-08 MED FILL — NovoLIN N 100 UNIT/ML SUSP: 100 | 13 days supply | Qty: 20 | Fill #2

## 2019-06-08 MED FILL — NOVOLOG FLEXPEN SYRINGE: 100 | 8 days supply | Qty: 15 | Fill #2

## 2019-06-13 ENCOUNTER — Other Ambulatory Visit: Payer: Self-pay | Admitting: Family Medicine

## 2019-06-14 MED FILL — RANOLAZINE ER 500 MG TABLET: 500 | 30 days supply | Qty: 60 | Fill #0

## 2019-06-25 ENCOUNTER — Other Ambulatory Visit: Payer: Self-pay

## 2019-06-25 ENCOUNTER — Ambulatory Visit (INDEPENDENT_AMBULATORY_CARE_PROVIDER_SITE_OTHER): Payer: Medicare PPO | Admitting: Family Medicine

## 2019-06-25 DIAGNOSIS — E782 Mixed hyperlipidemia: Secondary | ICD-10-CM | POA: Diagnosis not present

## 2019-06-25 DIAGNOSIS — G4700 Insomnia, unspecified: Secondary | ICD-10-CM

## 2019-06-25 DIAGNOSIS — R0609 Other forms of dyspnea: Secondary | ICD-10-CM

## 2019-06-25 DIAGNOSIS — I10 Essential (primary) hypertension: Secondary | ICD-10-CM | POA: Diagnosis not present

## 2019-06-25 MED ORDER — ALBUTEROL SULFATE HFA 108 (90 BASE) MCG/ACT IN AERS: 2.0000 | INHALATION_SPRAY | Freq: Four times a day (QID) | RESPIRATORY_TRACT | 2 refills | Status: DC | PRN

## 2019-06-25 MED FILL — ALBUTEROL SULFATE HFA 108 (: 108 (90 BAS | 25 days supply | Qty: 18 | Fill #0

## 2019-06-25 NOTE — Progress Notes (Signed)
Virtual Visit via phone Note  I connected with [name] [name] on 06/25/19 at  9:00 AM EDT by a phone enabled telemedicine application and verified that I am speaking with the correct person using two identifiers.  Location: Patient: home Provider: home   I discussed the limitations of evaluation and management by telemedicine and the availability of in person appointments. The patient expressed understanding and agreed to proceed. [name] [name] CMA was able to get him set up on phone visit when he was unable to do a video visit    Subjective:    Patient ID: [name] [name], male    DOB: [birth year], 73 y.o.   MRN: [id number]  No chief complaint on file.   HPI Patient is in today for follow up on chronic medical concerns including diabetes, hypertension, headaches and more. He is awaiting an appointment with neurology for his headaches and also notes significant hypersomnolance during the day and restless sleep at night. Notes headaches occur most days now. No other associated neurologic symptoms. No recent febrile illness or hospitalizations. His sugars are improving and his numbers generally below 200. Denies CP/palp/SOB/HA/congestion/fevers/GI or GU c/o. Taking meds as prescribed  Past Medical History:  Diagnosis Date  . Acquired atrophy of thyroid 06/05/2013   Overview:  July 2014: controlled on synthroid 46mcg since 2012 May 2015: decreased to Synthroid 50 mcg  Last Assessment & Plan:  Pt doing well with most recent TSH within normal range.  Will continue current dosage of Synthroid 61mcg. Pt reminded to take medication on empty stomach. Will continue to monitor with periodic laboratory assessment in 3 months.   . Anemia    hemoglobin 7.4, iron deficiency, January, 2011, 2 unit transfusion, endoscopy normal, capsule endoscopy February, 2011 no small bowel abnormalities.   Most likely source gastric erosions, followed by GI  . Anxiety   . Bradycardia   . CAD (coronary artery  disease)    A. CABG in 2000,status [name] cardiac cath in 2006, 2009 ....continued chest pain and SOB despite oral medication adjestments including Ranexa. B. Cath November 2009/ mRCA - 2.75 x 23 Abbott Xience V drug-eluting stent ...11/26/2008 to distal  RCA leading to acute marginal.  C. Cath 07/2012 for CP - stable anatomy, med rx. d. cath 2015 and 05/30/2015 stable anatomy, consider Myoview if has CP again  . Carotid artery disease ([place]) 08/01/2015   Doppler, May 29, 2015, 1-39% bilateral ICA   . Cerebral ischemia    MRI November, 2010, chronic microvascular ischemia  . CKD (chronic kidney disease), stage III ([place])   . Concussion   . Depression    Bipolar  . Diabetes mellitus ([place])   . Edema   . Essential hypertension 06/05/2013   Overview:  July 2014: Controlled with Bystolic Sep 3570: Changed to atenolol.  Last Assessment & Plan:  Will change to atenolol for cost and follow Improved.  Medication compliance strongly encouraged BP: 116/64 mmHg    Overview:  July 2014: Controlled with Bystolic Sep 1779: Changed to atenolol.  Last Assessment & Plan:  Will change to atenolol for cost and follow  . Falling episodes    these have occurred in the past and again recurring 2011  . Family history of adverse reaction to anesthesia    "mother died during bypass surgery but not sure if it has to do with anesthesia"  . Gastric ulcer   . GERD (gastroesophageal reflux disease)   . H/O medication noncompliance    Due to loss of  insurance  . H/O multiple concussions   . Hard of hearing   . History of blood transfusion 12/20/2013  . Hx of CABG    2000,  / one median sternotomy suture broken her chest x-ray November, 2010, no clinical significance  . Hyperlipidemia   . Hypertension    pt. denies  . Iron deficiency anemia   . Long term (current) use of anticoagulants [Z79.01] 07/20/2016  . Low back pain 06/12/2009   Qualifier: Diagnosis of  By: [name]   . Nephrolithiasis   . Orthostasis   . OSA  (obstructive sleep apnea)   . PAF (paroxysmal atrial fibrillation) ([place])    a. dx 2017, started on amiodarone/warfarin but patient intermittently noncompliant.  Marland Kitchen PSVT (paroxysmal supraventricular tachycardia) ([place])   . RBBB 07/09/2009   Qualifier: Diagnosis of  By: [name], MD, [name] [name]   . RLS (restless legs syndrome) 09/19/2009   Qualifier: Diagnosis of  By: [name]   Overview:  July 2014: Controlled with Mirapex  Last Assessment & Plan:  Patient is doing well. Will continue current management and follow clinically.  . Seizure disorder ([place]) 01/09/2017  . Spondylosis    C5-6, C6-7 MRI 2010  . Syncope 03/2016  . Thyroid disease   . Tubulovillous adenoma of colon 2007  . Vitamin D deficiency 05/17/2017  . Wears glasses     Past Surgical History:  Procedure Laterality Date  . ANTERIOR CERVICAL DECOMP/DISCECTOMY FUSION N/A 05/31/2016   Procedure: ANTERIOR CERVICAL DECOMPRESSION/DISCECTOMY FUSION CERVICAL FIVE-SIX,CERVICAL SIX-SEVEN;  Surgeon: [name], MD;  Location: [facility];  Service: Neurosurgery;  Laterality: N/A;  . CARDIAC CATHETERIZATION N/A 05/30/2015   Procedure: Left Heart Cath and Coronary Angiography;  Surgeon: [name], MD;  Location: [facility];  Service: Cardiovascular;  Laterality: N/A;  . COLONOSCOPY    . CORONARY ARTERY BYPASS GRAFT     2000  . ESOPHAGOGASTRODUODENOSCOPY    . LEFT HEART CATH AND CORS/GRAFTS ANGIOGRAPHY N/A 12/15/2017   Procedure: LEFT HEART CATH AND CORS/GRAFTS ANGIOGRAPHY;  Surgeon: [name], MD;  Location: [facility];  Service: Cardiovascular;  Laterality: N/A;  . LEFT HEART CATHETERIZATION WITH CORONARY/GRAFT ANGIOGRAM N/A 08/01/2012   Procedure: LEFT HEART CATHETERIZATION WITH Beatrix Fetters;  Surgeon: [name], MD;  [facility];  Service: Cardiovascular;  Laterality: N/A;  . LEFT HEART CATHETERIZATION WITH CORONARY/GRAFT ANGIOGRAM N/A 01/03/2015   Procedure: LEFT HEART  CATHETERIZATION WITH Beatrix Fetters;  Surgeon: [name], MD;  Location: [facility] CATH LAB;  Service: Cardiovascular;  Laterality: N/A;  . NASAL SEPTUM SURGERY     UP3  . PERCUTANEOUS CORONARY STENT INTERVENTION (PCI-S)  10/2008   mRCA PCI  2.75 x 23 Abbott Xience V drug-eluting stent   . ULTRASOUND GUIDANCE FOR VASCULAR ACCESS  12/15/2017   Procedure: Ultrasound Guidance For Vascular Access;  Surgeon: [name], MD;  Location: [facility] LAB;  Service: Cardiovascular;;    Family History  Problem Relation Age of Onset  . Pancreatic cancer Brother   . Diabetes Brother   . Coronary artery disease Brother   . Stroke Brother   . Diabetes Brother   . Diabetes Mother   . Heart failure Mother   . Heart failure Father   . Hypothyroidism Brother   . Coronary artery disease Brother   . Other Brother        colon surgery  . Heart attack Other  Nephew  . Irregular heart beat Daughter   . Cancer Maternal Grandmother        unknown   . Colon cancer Neg Hx   . Stomach cancer Neg Hx   . Liver cancer Neg Hx   . Rectal cancer Neg Hx   . Esophageal cancer Neg Hx     Social History   Socioeconomic History  . Marital status: Married    Spouse name: Not on file  . Number of children: 4  . Years of education: 74  . Highest education level: Not on file  Occupational History  . Occupation: retired  Scientific laboratory technician  . Financial resource strain: Not on file  . Food insecurity    Worry: Not on file    Inability: Not on file  . Transportation needs    Medical: Not on file    Non-medical: Not on file  Tobacco Use  . Smoking status: Never Smoker  . Smokeless tobacco: Never Used  Substance and Sexual Activity  . Alcohol use: No    Alcohol/week: 0.0 standard drinks    Comment: stopped drinking in 1998  . Drug use: No  . Sexual activity: Not on file  Lifestyle  . Physical activity    Days per week: Not on file    Minutes per session: Not on file  . Stress:  Not on file  Relationships  . Social Herbalist on phone: Not on file    Gets together: Not on file    Attends religious service: Not on file    Active member of club or organization: Not on file    Attends meetings of clubs or organizations: Not on file    Relationship status: Not on file  . Intimate partner violence    Fear of current or ex partner: Not on file    Emotionally abused: Not on file    Physically abused: Not on file    Forced sexual activity: Not on file  Other Topics Concern  . Not on file  Social History Narrative   Patient is right handed.   Patient does not drink caffeine.    Outpatient Medications Prior to Visit  Medication Sig Dispense Refill  . apixaban (ELIQUIS) 5 MG TABS tablet Take 1 tablet (5 mg total) by mouth 2 (two) times daily. 60 tablet 5  . atorvastatin (LIPITOR) 80 MG tablet Take 1 tablet (80 mg total) by mouth daily. 90 tablet 3  . diclofenac sodium (VOLTAREN) 1 % GEL Apply 2 g topically daily as needed (for pain in affected area). 2 Tube 0  . divalproex (DEPAKOTE ER) 250 MG 24 hr tablet Take 3 tablets (750 mg total) by mouth at bedtime. 180 tablet 0  . ferrous sulfate 325 (65 FE) MG tablet Take 1 tablet (325 mg total) by mouth every other day.  3  . furosemide (LASIX) 20 MG tablet Take 1 tablet (20 mg total) by mouth daily. 30 tablet 0  . gabapentin (NEURONTIN) 100 MG capsule 1 capsule in the morning, 3 in the evening 120 capsule 3  . glucose blood (ACCU-CHEK AVIVA) test strip Use as directed to check blood sugar 4 times daily.  DX E11.9 200 each 6  . insulin aspart (NOVOLOG FLEXPEN) 100 UNIT/ML FlexPen 65-85 with breakfast, 70 with lunch, and 100 with supper, and pen needles 3/day 20 pen 0  . insulin NPH Human (NOVOLIN N) 100 UNIT/ML injection Inject 0.6 mLs (60 Units total) into the skin at bedtime.  And syringes 1/day 130 mL 0  . isosorbide mononitrate (IMDUR) 60 MG 24 hr tablet TAKE TWO TABLETS BY MOUTH DAILY 180 tablet 0  .  levothyroxine (SYNTHROID, LEVOTHROID) 100 MCG tablet Take 1 tablet (100 mcg total) by mouth daily. 90 tablet 1  . loperamide (IMODIUM A-D) 2 MG tablet Take 1 tablet (2 mg total) by mouth 4 (four) times daily as needed for diarrhea or loose stools. (Patient not taking: Reported on 05/16/2019) 30 tablet 0  . metoprolol succinate (TOPROL-XL) 50 MG 24 hr tablet TAKE 1&1/2 TABLETS BY MOUTH ONCE A DAY FOR A TOTAL OF 75 MG. 135 tablet 3  . midodrine (PROAMATINE) 10 MG tablet Take 20 mg by mouth daily.    . nitroGLYCERIN (NITROSTAT) 0.4 MG SL tablet Place 1 tablet (0.4 mg total) under the tongue every 5 (five) minutes as needed for chest pain. 25 tablet 1  . pantoprazole (PROTONIX) 40 MG tablet TAKE 1 TABLET (40 MG TOTAL) BY MOUTH DAILY. 90 tablet 0  . pramipexole (MIRAPEX) 1 MG tablet TAKE 1/2 TO 1 TABLET AT BEDTIME 90 tablet 0  . ranolazine (RANEXA) 500 MG 12 hr tablet TAKE 1 TABLET (500 MG TOTAL) BY MOUTH 2 (TWO) TIMES DAILY. 60 tablet 0  . sertraline (ZOLOFT) 100 MG tablet TAKE 1 TABLET (100 MG TOTAL) BY MOUTH 2 (TWO) TIMES DAILY. 180 tablet 1  . tiZANidine (ZANAFLEX) 4 MG tablet Take 1 tablet (4 mg total) by mouth every 6 (six) hours as needed for muscle spasms. 40 tablet 2  . Vitamin D, Ergocalciferol, (DRISDOL) 1.25 MG (50000 UT) CAPS capsule Take 1 capsule (50,000 Units total) by mouth every 7 (seven) days. (Patient not taking: Reported on 05/16/2019) 20 capsule 0   No facility-administered medications prior to visit.     Allergies  Allergen Reactions  . Morphine Other (See Comments)    hallucinations  . Shellfish-Derived Products Itching    Review of Systems  Constitutional: Positive for malaise/fatigue. Negative for fever.  HENT: Negative for congestion.   Eyes: Negative for blurred vision.  Respiratory: Positive for shortness of breath.   Cardiovascular: Negative for chest pain, palpitations and leg swelling.  Gastrointestinal: Negative for abdominal pain, blood in stool and nausea.   Genitourinary: Negative for dysuria and frequency.  Musculoskeletal: Negative for falls.  Skin: Negative for rash.  Neurological: Positive for headaches. Negative for dizziness and loss of consciousness.  Endo/Heme/Allergies: Negative for environmental allergies.  Psychiatric/Behavioral: Positive for memory loss. Negative for depression. The patient has insomnia. The patient is not nervous/anxious.        Objective:    Physical Exam unable to obtain during phone visit  There were no vitals taken for this visit. Wt Readings from Last 3 Encounters:  05/16/19 207 lb (93.9 kg)  05/07/19 207 lb (93.9 kg)  05/04/19 212 lb 9.6 oz (96.4 kg)    Diabetic Foot Exam - Simple   No data filed     Lab Results  Component Value Date   WBC 7.6 05/14/2019   HGB 13.4 05/14/2019   HCT 38.4 (L) 05/14/2019   PLT 176.0 05/14/2019   GLUCOSE 358 (H) 05/14/2019   CHOL 166 05/14/2019   TRIG 288.0 (H) 05/14/2019   HDL 29.60 (L) 05/14/2019   LDLDIRECT 111.0 05/14/2019   LDLCALC 32 07/17/2018   ALT 25 05/14/2019   AST 16 05/14/2019   NA 136 05/14/2019   K 4.8 05/14/2019   CL 103 05/14/2019   CREATININE 1.64 (H) 05/14/2019   BUN  21 05/14/2019   CO2 24 05/14/2019   TSH 2.58 05/14/2019   PSA 2.39 11/25/2010   INR 2.7 04/23/2019   HGBA1C 9.4 (H) 05/14/2019   MICROALBUR <0.7 05/14/2019    Lab Results  Component Value Date   TSH 2.58 05/14/2019   Lab Results  Component Value Date   WBC 7.6 05/14/2019   HGB 13.4 05/14/2019   HCT 38.4 (L) 05/14/2019   MCV 91.8 05/14/2019   PLT 176.0 05/14/2019   Lab Results  Component Value Date   NA 136 05/14/2019   K 4.8 05/14/2019   CO2 24 05/14/2019   GLUCOSE 358 (H) 05/14/2019   BUN 21 05/14/2019   CREATININE 1.64 (H) 05/14/2019   BILITOT 0.7 05/14/2019   ALKPHOS 97 05/14/2019   AST 16 05/14/2019   ALT 25 05/14/2019   PROT 6.8 05/14/2019   ALBUMIN 4.3 05/14/2019   CALCIUM 9.2 05/14/2019   ANIONGAP 11 11/07/2018   GFR 41.66 (L) 05/14/2019    Lab Results  Component Value Date   CHOL 166 05/14/2019   Lab Results  Component Value Date   HDL 29.60 (L) 05/14/2019   Lab Results  Component Value Date   LDLCALC 32 07/17/2018   Lab Results  Component Value Date   TRIG 288.0 (H) 05/14/2019   Lab Results  Component Value Date   CHOLHDL 6 05/14/2019   Lab Results  Component Value Date   HGBA1C 9.4 (H) 05/14/2019       Assessment & Plan:   Problem List Items Addressed This Visit    Insomnia    Encouraged good sleep hygiene such as dark, quiet room. No blue/green glowing lights such as computer screens in bedroom. No alcohol or stimulants in evening. Cut down on caffeine as able. Regular exercise is helpful but not just prior to bed time. Sleeps a lot during the day as well, falls asleep. Encouraged to try and consolidate sleep but also to discuss how easily he falls asleep during the day with neurology for consideration of narcolepsy      Essential hypertension     no changes to meds. Encouraged heart healthy diet such as the DASH diet and exercise as tolerated.       HLD (hyperlipidemia)    Tolerating statin, encouraged heart healthy diet, avoid trans fats, minimize simple carbs and saturated fats. Increase exercise as tolerated.      DOE (dyspnea on exertion)    Worse in the heat. Is given a prescription for Albuterol to use prn and he will report if worsens         I am having [name] [name] start on albuterol. I am also having him maintain his loperamide, diclofenac sodium, divalproex, nitroGLYCERIN, Vitamin D (Ergocalciferol), sertraline, midodrine, glucose blood, levothyroxine, tiZANidine, atorvastatin, metoprolol succinate, furosemide, gabapentin, apixaban, insulin NPH Human, insulin aspart, isosorbide mononitrate, ferrous sulfate, pantoprazole, pramipexole, and ranolazine.  Meds ordered this encounter  Medications  . albuterol (VENTOLIN HFA) 108 (90 Base) MCG/ACT inhaler    Sig: Inhale 2 puffs into  the lungs every 6 (six) hours as needed for wheezing or shortness of breath.    Dispense:  18 g    Refill:  2     I discussed the assessment and treatment plan with the patient. The patient was provided an opportunity to ask questions and all were answered. The patient agreed with the plan and demonstrated an understanding of the instructions.   The patient was advised to call back or seek an  in-person evaluation if the symptoms worsen or if the condition fails to improve as anticipated.  I provided 25 minutes of non-face-to-face time during this encounter.   Penni Homans, MD

## 2019-06-25 NOTE — Assessment & Plan Note (Signed)
Tolerating statin, encouraged heart healthy diet, avoid trans fats, minimize simple carbs and saturated fats. Increase exercise as tolerated 

## 2019-06-25 NOTE — Assessment & Plan Note (Signed)
Encouraged good sleep hygiene such as dark, quiet room. No blue/green glowing lights such as computer screens in bedroom. No alcohol or stimulants in evening. Cut down on caffeine as able. Regular exercise is helpful but not just prior to bed time. Sleeps a lot during the day as well, falls asleep. Encouraged to try and consolidate sleep but also to discuss how easily he falls asleep during the day with neurology for consideration of narcolepsy

## 2019-06-25 NOTE — Assessment & Plan Note (Signed)
no changes to meds. Encouraged heart healthy diet such as the DASH diet and exercise as tolerated.  

## 2019-06-25 NOTE — Assessment & Plan Note (Signed)
Worse in the heat. Is given a prescription for Albuterol to use prn and he will report if worsens

## 2019-06-27 MED FILL — NOVOLOG FLEXPEN SYRINGE: 100 | 8 days supply | Qty: 15 | Fill #3

## 2019-07-04 ENCOUNTER — Other Ambulatory Visit: Payer: Self-pay

## 2019-07-06 ENCOUNTER — Other Ambulatory Visit: Payer: Self-pay

## 2019-07-06 ENCOUNTER — Ambulatory Visit (INDEPENDENT_AMBULATORY_CARE_PROVIDER_SITE_OTHER): Payer: Medicare PPO | Admitting: Endocrinology

## 2019-07-06 ENCOUNTER — Encounter: Payer: Self-pay | Admitting: Endocrinology

## 2019-07-06 ENCOUNTER — Other Ambulatory Visit: Payer: Self-pay | Admitting: Interventional Cardiology

## 2019-07-06 VITALS — BP 120/72 | HR 58 | Ht 68.0 in | Wt 209.6 lb

## 2019-07-06 DIAGNOSIS — R609 Edema, unspecified: Secondary | ICD-10-CM | POA: Diagnosis not present

## 2019-07-06 DIAGNOSIS — E1129 Type 2 diabetes mellitus with other diabetic kidney complication: Secondary | ICD-10-CM | POA: Diagnosis not present

## 2019-07-06 DIAGNOSIS — E1165 Type 2 diabetes mellitus with hyperglycemia: Secondary | ICD-10-CM | POA: Diagnosis not present

## 2019-07-06 DIAGNOSIS — E1151 Type 2 diabetes mellitus with diabetic peripheral angiopathy without gangrene: Secondary | ICD-10-CM

## 2019-07-06 LAB — POCT GLYCOSYLATED HEMOGLOBIN (HGB A1C): Hemoglobin A1C: 9.6 % — AB (ref 4.0–5.6)

## 2019-07-06 MED ORDER — INSULIN NPH (HUMAN) (ISOPHANE) 100 UNIT/ML ~~LOC~~ SUSP: 60.0000 [IU] | Freq: Every day | SUBCUTANEOUS | 0 refills | Status: DC

## 2019-07-06 MED ORDER — NOVOLOG FLEXPEN 100 UNIT/ML ~~LOC~~ SOPN: PEN_INJECTOR | SUBCUTANEOUS | 0 refills | Status: DC

## 2019-07-06 MED ORDER — MIDODRINE HCL 10 MG PO TABS: 20.0000 mg | ORAL_TABLET | Freq: Every day | ORAL | 2 refills | Status: DC

## 2019-07-06 MED FILL — MIDODRINE HCL 10 MG TABLET: 10 | 90 days supply | Qty: 180 | Fill #0

## 2019-07-06 MED FILL — ELIQUIS 5 MG TABLET: 5 | 30 days supply | Qty: 60 | Fill #2

## 2019-07-06 MED FILL — ULTICARE PEN NDL 8MM 31G: 31G X 8 MM | 30 days supply | Qty: 100 | Fill #0

## 2019-07-06 MED FILL — NOVOLOG FLEXPEN SYRINGE: 100 | 22 days supply | Qty: 60 | Fill #0

## 2019-07-06 NOTE — Progress Notes (Signed)
Subjective:    Patient ID: [name] [name], male    DOB: 06/11/1946, 73 y.o.   MRN: [id number]  HPI Pt returns for f/u of diabetes mellitus:  DM type: Insulin-requiring type 2.  Dx'ed: 4034 Complications: polyneuropathy, renal insuff, CAD and PAD.  Therapy: insulin since 2008 DKA: never Severe hypoglycemia: never Pancreatitis: never Pancreatic imaging: 2017 CT showed fatty replacement of the pancreatic head and body.  Other: he takes multiple daily injections; he takes human insulin at HS, due to cost.  Interval history: no cbg record, but states cbg's vary from 78-245.  It is in general lowest in the afternoon, and highest at HS.  He says he seldom misses the insulin.  pt states he feels well in general.  Past Medical History:  Diagnosis Date  . Acquired atrophy of thyroid 06/05/2013   Overview:  July 2014: controlled on synthroid 54mcg since 2012 May 2015: decreased to Synthroid 50 mcg  Last Assessment & Plan:  Pt doing well with most recent TSH within normal range.  Will continue current dosage of Synthroid 43mcg. Pt reminded to take medication on empty stomach. Will continue to monitor with periodic laboratory assessment in 3 months.   . Anemia    hemoglobin 7.4, iron deficiency, January, 2011, 2 unit transfusion, endoscopy normal, capsule endoscopy February, 2011 no small bowel abnormalities.   Most likely source gastric erosions, followed by GI  . Anxiety   . Bradycardia   . CAD (coronary artery disease)    A. CABG in 2000,status post cardiac cath in 2006, 2009 ....continued chest pain and SOB despite oral medication adjestments including Ranexa. B. Cath November 2009/ mRCA - 2.75 x 23 Abbott Xience V drug-eluting stent ...11/26/2008 to distal  RCA leading to acute marginal.  C. Cath 07/2012 for CP - stable anatomy, med rx. d. cath 2015 and 05/30/2015 stable anatomy, consider Myoview if has CP again  . Carotid artery disease ([place]) 08/01/2015   Doppler, May 29, 2015, 1-39% bilateral  ICA   . Cerebral ischemia    MRI November, 2010, chronic microvascular ischemia  . CKD (chronic kidney disease), stage III ([place])   . Concussion   . Depression    Bipolar  . Diabetes mellitus ([place])   . Edema   . Essential hypertension 06/05/2013   Overview:  July 2014: Controlled with Bystolic Sep 7425: Changed to atenolol.  Last Assessment & Plan:  Will change to atenolol for cost and follow Improved.  Medication compliance strongly encouraged BP: 116/64 mmHg    Overview:  July 2014: Controlled with Bystolic Sep 9563: Changed to atenolol.  Last Assessment & Plan:  Will change to atenolol for cost and follow  . Falling episodes    these have occurred in the past and again recurring 2011  . Family history of adverse reaction to anesthesia    "mother died during bypass surgery but not sure if it has to do with anesthesia"  . Gastric ulcer   . GERD (gastroesophageal reflux disease)   . H/O medication noncompliance    Due to loss of insurance  . H/O multiple concussions   . Hard of hearing   . History of blood transfusion 12/20/2013  . Hx of CABG    2000,  / one median sternotomy suture broken her chest x-ray November, 2010, no clinical significance  . Hyperlipidemia   . Hypertension    pt. denies  . Iron deficiency anemia   . Long term (current) use of anticoagulants [Z79.01] 07/20/2016  .  Low back pain 06/12/2009   Qualifier: Diagnosis of  By: [name]   . Nephrolithiasis   . Orthostasis   . OSA (obstructive sleep apnea)   . PAF (paroxysmal atrial fibrillation) ([place])    a. dx 2017, started on amiodarone/warfarin but patient intermittently noncompliant.  Marland Kitchen PSVT (paroxysmal supraventricular tachycardia) ([place])   . RBBB 07/09/2009   Qualifier: Diagnosis of  By: [name], MD, [name] [name]   . RLS (restless legs syndrome) 09/19/2009   Qualifier: Diagnosis of  By: [name]   Overview:  July 2014: Controlled with Mirapex  Last Assessment & Plan:  Patient is doing well. Will  continue current management and follow clinically.  . Seizure disorder ([place]) 01/09/2017  . Spondylosis    C5-6, C6-7 MRI 2010  . Syncope 03/2016  . Thyroid disease   . Tubulovillous adenoma of colon 2007  . Vitamin D deficiency 05/17/2017  . Wears glasses     Past Surgical History:  Procedure Laterality Date  . ANTERIOR CERVICAL DECOMP/DISCECTOMY FUSION N/A 05/31/2016   Procedure: ANTERIOR CERVICAL DECOMPRESSION/DISCECTOMY FUSION CERVICAL FIVE-SIX,CERVICAL SIX-SEVEN;  Surgeon: [name], MD;  Location: [facility];  Service: Neurosurgery;  Laterality: N/A;  . CARDIAC CATHETERIZATION N/A 05/30/2015   Procedure: Left Heart Cath and Coronary Angiography;  Surgeon: [name], MD;  Location: [place];  Service: Cardiovascular;  Laterality: N/A;  . COLONOSCOPY    . CORONARY ARTERY BYPASS GRAFT     2000  . ESOPHAGOGASTRODUODENOSCOPY    . LEFT HEART CATH AND CORS/GRAFTS ANGIOGRAPHY N/A 12/15/2017   Procedure: LEFT HEART CATH AND CORS/GRAFTS ANGIOGRAPHY;  Surgeon: [name], MD;  Location: [place];  Service: Cardiovascular;  Laterality: N/A;  . LEFT HEART CATHETERIZATION WITH CORONARY/GRAFT ANGIOGRAM N/A 08/01/2012   Procedure: LEFT HEART CATHETERIZATION WITH Beatrix Fetters;  Surgeon: [name], MD;  Location: [facility] CATH LAB;  Service: Cardiovascular;  Laterality: N/A;  . LEFT HEART CATHETERIZATION WITH CORONARY/GRAFT ANGIOGRAM N/A 01/03/2015   Procedure: LEFT HEART CATHETERIZATION WITH Beatrix Fetters;  Surgeon: [name], MD;  Location: [facility] CATH LAB;  Service: Cardiovascular;  Laterality: N/A;  . NASAL SEPTUM SURGERY     UP3  . PERCUTANEOUS CORONARY STENT INTERVENTION (PCI-S)  10/2008   mRCA PCI  2.75 x 23 Abbott Xience V drug-eluting stent   . ULTRASOUND GUIDANCE FOR VASCULAR ACCESS  12/15/2017   Procedure: Ultrasound Guidance For Vascular Access;  Surgeon: [name], MD;  Location: [place] [facility] LAB;  Service:  Cardiovascular;;    Social History   Socioeconomic History  . Marital status: Married    Spouse name: Not on file  . Number of children: 4  . Years of education: 5  . Highest education level: Not on file  Occupational History  . Occupation: retired  Scientific laboratory technician  . Financial resource strain: Not on file  . Food insecurity    Worry: Not on file    Inability: Not on file  . Transportation needs    Medical: Not on file    Non-medical: Not on file  Tobacco Use  . Smoking status: Never Smoker  . Smokeless tobacco: Never Used  Substance and Sexual Activity  . Alcohol use: No    Alcohol/week: 0.0 standard drinks    Comment: stopped drinking in 1998  . Drug use: No  . Sexual activity: Not on file  Lifestyle  . Physical activity    Days per week: Not on file  Minutes per session: Not on file  . Stress: Not on file  Relationships  . Social Herbalist on phone: Not on file    Gets together: Not on file    Attends religious service: Not on file    Active member of club or organization: Not on file    Attends meetings of clubs or organizations: Not on file    Relationship status: Not on file  . Intimate partner violence    Fear of current or ex partner: Not on file    Emotionally abused: Not on file    Physically abused: Not on file    Forced sexual activity: Not on file  Other Topics Concern  . Not on file  Social History Narrative   Patient is right handed.   Patient does not drink caffeine.    Current Outpatient Medications on File Prior to Visit  Medication Sig Dispense Refill  . albuterol (VENTOLIN HFA) 108 (90 Base) MCG/ACT inhaler Inhale 2 puffs into the lungs every 6 (six) hours as needed for wheezing or shortness of breath. 18 g 2  . apixaban (ELIQUIS) 5 MG TABS tablet Take 1 tablet (5 mg total) by mouth 2 (two) times daily. 60 tablet 5  . atorvastatin (LIPITOR) 80 MG tablet Take 1 tablet (80 mg total) by mouth daily. 90 tablet 3  . diclofenac  sodium (VOLTAREN) 1 % GEL Apply 2 g topically daily as needed (for pain in affected area). 2 Tube 0  . divalproex (DEPAKOTE ER) 250 MG 24 hr tablet Take 3 tablets (750 mg total) by mouth at bedtime. 180 tablet 0  . ferrous sulfate 325 (65 FE) MG tablet Take 1 tablet (325 mg total) by mouth every other day.  3  . furosemide (LASIX) 20 MG tablet Take 1 tablet (20 mg total) by mouth daily. 30 tablet 0  . gabapentin (NEURONTIN) 100 MG capsule 1 capsule in the morning, 3 in the evening 120 capsule 3  . glucose blood (ACCU-CHEK AVIVA) test strip Use as directed to check blood sugar 4 times daily.  DX E11.9 200 each 6  . isosorbide mononitrate (IMDUR) 60 MG 24 hr tablet TAKE TWO TABLETS BY MOUTH DAILY 180 tablet 0  . levothyroxine (SYNTHROID, LEVOTHROID) 100 MCG tablet Take 1 tablet (100 mcg total) by mouth daily. 90 tablet 1  . loperamide (IMODIUM A-D) 2 MG tablet Take 1 tablet (2 mg total) by mouth 4 (four) times daily as needed for diarrhea or loose stools. 30 tablet 0  . metoprolol succinate (TOPROL-XL) 50 MG 24 hr tablet TAKE 1&1/2 TABLETS BY MOUTH ONCE A DAY FOR A TOTAL OF 75 MG. 135 tablet 3  . nitroGLYCERIN (NITROSTAT) 0.4 MG SL tablet Place 1 tablet (0.4 mg total) under the tongue every 5 (five) minutes as needed for chest pain. 25 tablet 1  . pantoprazole (PROTONIX) 40 MG tablet TAKE 1 TABLET (40 MG TOTAL) BY MOUTH DAILY. 90 tablet 0  . pramipexole (MIRAPEX) 1 MG tablet TAKE 1/2 TO 1 TABLET AT BEDTIME 90 tablet 0  . ranolazine (RANEXA) 500 MG 12 hr tablet TAKE 1 TABLET (500 MG TOTAL) BY MOUTH 2 (TWO) TIMES DAILY. 60 tablet 0  . sertraline (ZOLOFT) 100 MG tablet TAKE 1 TABLET (100 MG TOTAL) BY MOUTH 2 (TWO) TIMES DAILY. 180 tablet 1  . tiZANidine (ZANAFLEX) 4 MG tablet Take 1 tablet (4 mg total) by mouth every 6 (six) hours as needed for muscle spasms. 40 tablet 2  .  Vitamin D, Ergocalciferol, (DRISDOL) 1.25 MG (50000 UT) CAPS capsule Take 1 capsule (50,000 Units total) by mouth every 7 (seven)  days. 20 capsule 0   No current facility-administered medications on file prior to visit.     Allergies  Allergen Reactions  . Morphine Other (See Comments)    hallucinations  . Shellfish-Derived Products Itching    Family History  Problem Relation Age of Onset  . Pancreatic cancer Brother   . Diabetes Brother   . Coronary artery disease Brother   . Stroke Brother   . Diabetes Brother   . Diabetes Mother   . Heart failure Mother   . Heart failure Father   . Hypothyroidism Brother   . Coronary artery disease Brother   . Other Brother        colon surgery  . Heart attack Other        Nephew  . Irregular heart beat Daughter   . Cancer Maternal Grandmother        unknown   . Colon cancer Neg Hx   . Stomach cancer Neg Hx   . Liver cancer Neg Hx   . Rectal cancer Neg Hx   . Esophageal cancer Neg Hx     BP 120/72 (BP Location: Left Arm, Patient Position: Sitting, Cuff Size: Normal)   Pulse (!) 58   Ht 5\' 8"  (1.727 m)   Wt 209 lb 9.6 oz (95.1 kg)   SpO2 95%   BMI 31.87 kg/m    Review of Systems He denies hypoglycemia.     Objective:   Physical Exam VITAL SIGNS:  See vs page GENERAL: no distress Pulses: dorsalis pedis intact bilat.   MSK: no deformity of the feet CV: trace bilat leg edema Skin:  no ulcer on the feet.  normal color and temp on the feet. Neuro: sensation is intact to touch on the feet  Lab Results  Component Value Date   HGBA1C 9.6 (A) 07/06/2019   Lab Results  Component Value Date   CREATININE 1.64 (H) 05/14/2019   BUN 21 05/14/2019   NA 136 05/14/2019   K 4.8 05/14/2019   CL 103 05/14/2019   CO2 24 05/14/2019      Assessment & Plan:  Insulin-requiring type 2 DM, with PAD: he needs increased rx Renal insuff: in this setting, he needs most of his daily insulin in the form of fast-acting insulin.  Edema: This limits rx options  Patient Instructions  Please increase the insulin, as below. check your blood sugar 4 times a day: before  the 3 meals, and at bedtime.  also check if you have symptoms of your blood sugar being too high or too low.  please keep a record of the readings and bring it to your next appointment here (or you can bring the meter itself).  You can write it on any piece of paper.  please call us sooner if your blood sugar goes below 70, or if you have a lot of readings over 200. When we have a record of your blood sugar, I'll request the continuous glucose monitor. Please come back for a follow-up appointment in 2 months.

## 2019-07-06 NOTE — Patient Instructions (Signed)
Please increase the insulin, as below. check your blood sugar 4 times a day: before the 3 meals, and at bedtime.  also check if you have symptoms of your blood sugar being too high or too low.  please keep a record of the readings and bring it to your next appointment here (or you can bring the meter itself).  You can write it on any piece of paper.  please call us sooner if your blood sugar goes below 70, or if you have a lot of readings over 200. When we have a record of your blood sugar, I'll request the continuous glucose monitor. Please come back for a follow-up appointment in 2 months.

## 2019-07-20 MED FILL — NovoLIN N 100 UNIT/ML SUSP: 100 | 84 days supply | Qty: 50 | Fill #0

## 2019-07-31 ENCOUNTER — Ambulatory Visit (INDEPENDENT_AMBULATORY_CARE_PROVIDER_SITE_OTHER): Payer: Medicare PPO

## 2019-07-31 ENCOUNTER — Other Ambulatory Visit: Payer: Self-pay

## 2019-07-31 DIAGNOSIS — Z23 Encounter for immunization: Secondary | ICD-10-CM

## 2019-08-03 ENCOUNTER — Other Ambulatory Visit: Payer: Self-pay | Admitting: Endocrinology

## 2019-08-03 ENCOUNTER — Other Ambulatory Visit: Payer: Self-pay | Admitting: Family Medicine

## 2019-08-03 MED FILL — NOVOLOG FLEXPEN SYRINGE: 100 | 22 days supply | Qty: 60 | Fill #0

## 2019-08-06 MED FILL — RANOLAZINE ER 500 MG TABLET: 500 | 30 days supply | Qty: 60 | Fill #0

## 2019-08-10 MED FILL — ELIQUIS 5 MG TABLET: 5 | 30 days supply | Qty: 60 | Fill #3

## 2019-08-14 ENCOUNTER — Other Ambulatory Visit: Payer: Self-pay

## 2019-08-14 ENCOUNTER — Ambulatory Visit (INDEPENDENT_AMBULATORY_CARE_PROVIDER_SITE_OTHER): Payer: Medicare PPO | Admitting: Family Medicine

## 2019-08-14 VITALS — Wt 206.0 lb

## 2019-08-14 DIAGNOSIS — M545 Low back pain, unspecified: Secondary | ICD-10-CM

## 2019-08-14 DIAGNOSIS — R0609 Other forms of dyspnea: Secondary | ICD-10-CM

## 2019-08-14 DIAGNOSIS — E782 Mixed hyperlipidemia: Secondary | ICD-10-CM

## 2019-08-14 DIAGNOSIS — N183 Chronic kidney disease, stage 3 unspecified: Secondary | ICD-10-CM

## 2019-08-14 DIAGNOSIS — E1165 Type 2 diabetes mellitus with hyperglycemia: Secondary | ICD-10-CM

## 2019-08-14 DIAGNOSIS — R0600 Dyspnea, unspecified: Secondary | ICD-10-CM | POA: Diagnosis not present

## 2019-08-14 DIAGNOSIS — R300 Dysuria: Secondary | ICD-10-CM | POA: Diagnosis not present

## 2019-08-14 DIAGNOSIS — E1029 Type 1 diabetes mellitus with other diabetic kidney complication: Secondary | ICD-10-CM | POA: Diagnosis not present

## 2019-08-14 DIAGNOSIS — I10 Essential (primary) hypertension: Secondary | ICD-10-CM | POA: Diagnosis not present

## 2019-08-14 DIAGNOSIS — IMO0002 Reserved for concepts with insufficient information to code with codable children: Secondary | ICD-10-CM

## 2019-08-14 DIAGNOSIS — R42 Dizziness and giddiness: Secondary | ICD-10-CM

## 2019-08-14 DIAGNOSIS — E034 Atrophy of thyroid (acquired): Secondary | ICD-10-CM

## 2019-08-14 DIAGNOSIS — R351 Nocturia: Secondary | ICD-10-CM

## 2019-08-14 DIAGNOSIS — E1065 Type 1 diabetes mellitus with hyperglycemia: Secondary | ICD-10-CM

## 2019-08-14 NOTE — Progress Notes (Signed)
Patient states he feels dizzy all the time states when he gets up and move around his back feels horrible. Also says the Novolog works the best.

## 2019-08-15 NOTE — Assessment & Plan Note (Signed)
He is now following with endocrinology and he notes his numbers are improving except his morning numbers are still hovering around 240 ish. He will try increasing his evening dose of novolin and novolog by 5 units and see if that improves his morning numbers.

## 2019-08-15 NOTE — Assessment & Plan Note (Addendum)
He notes low back pain has worsened significantly in the past couple of weeks but he denies any trauma or issues with incontinence. Will proceed with xray and consider referral if pain persists. Labs ordered as well including UA with culture

## 2019-08-15 NOTE — Progress Notes (Signed)
Patient ID: [name] [name], male   DOB: March 17, 1946, 73 y.o.   MRN: [id number] Virtual Visit via phone Note  I connected with Cinda Quest Towery on 08/14/19 at  3:20 PM EDT by a phone enabled telemedicine application and verified that I am speaking with the correct person using two identifiers.  Location: Patient: home Provider: office   I discussed the limitations of evaluation and management by telemedicine and the availability of in person appointments. The patient expressed understanding and agreed to proceed. Magdalene Molly, CMA was able to get the patient set up with visit, phone after being unable to set up video visit    Subjective:    Patient ID: [name] [name], male    DOB: 04/08/1946, 73 y.o.   MRN: [id number]  No chief complaint on file.   HPI Patient is in today for follow up on chronic medical concerns including hypertension, diabetes, headaches and more. He continues to endorse intermittent sensation of disequilibrium and headaches. No significant changes and he has an appointment with neurology soon to have further evaluation as previous work up has been unable to pin down cause. He is noting sugars are improving except for his morning sugars which are often between 240 and 250.despite using Novolin at 90 units in evening and Novolog 120 units at supper. He also is noting an increase in shortness of breath with exertion over the past few weeks. No recent febrile illness, trauma or hospitalizations. His low back pain is escalating and causing him significant pain most days now. No complaints of incontinence or radiculopathy. Denies CP/palp/congestion/fevers/GI or GU c/o. Taking meds as prescribed  Past Medical History:  Diagnosis Date   Acquired atrophy of thyroid 06/05/2013   Overview:  July 2014: controlled on synthroid 4mcg since 2012 May 2015: decreased to Synthroid 50 mcg  Last Assessment & Plan:  Pt doing well with most recent TSH within normal range.  Will continue  current dosage of Synthroid 49mcg. Pt reminded to take medication on empty stomach. Will continue to monitor with periodic laboratory assessment in 3 months.    Anemia    hemoglobin 7.4, iron deficiency, January, 2011, 2 unit transfusion, endoscopy normal, capsule endoscopy February, 2011 no small bowel abnormalities.   Most likely source gastric erosions, followed by GI   Anxiety    Bradycardia    CAD (coronary artery disease)    A. CABG in 2000,status post cardiac cath in 2006, 2009 ....continued chest pain and SOB despite oral medication adjestments including Ranexa. B. Cath November 2009/ mRCA - 2.75 x 23 Abbott Xience V drug-eluting stent ...11/26/2008 to distal  RCA leading to acute marginal.  C. Cath 07/2012 for CP - stable anatomy, med rx. d. cath 2015 and 05/30/2015 stable anatomy, consider Myoview if has CP again   Carotid artery disease ([place]) 08/01/2015   Doppler, May 29, 2015, 1-39% bilateral ICA    Cerebral ischemia    MRI November, 2010, chronic microvascular ischemia   CKD (chronic kidney disease), stage III ([place])    Concussion    Depression    Bipolar   Diabetes mellitus ([place])    Edema    Essential hypertension 06/05/2013   Overview:  July 2014: Controlled with Bystolic Sep 7673: Changed to atenolol.  Last Assessment & Plan:  Will change to atenolol for cost and follow Improved.  Medication compliance strongly encouraged BP: 116/64 mmHg    Overview:  July 2014: Controlled with Bystolic Sep 4193: Changed to atenolol.  Last Assessment & Plan:  Will change to atenolol for cost and follow   Falling episodes    these have occurred in the past and again recurring 2011   Family history of adverse reaction to anesthesia    "mother died during bypass surgery but not sure if it has to do with anesthesia"   Gastric ulcer    GERD (gastroesophageal reflux disease)    H/O medication noncompliance    Due to loss of insurance   H/O multiple concussions    Hard of hearing      History of blood transfusion 12/20/2013   Hx of CABG    2000,  / one median sternotomy suture broken her chest x-ray November, 2010, no clinical significance   Hyperlipidemia    Hypertension    pt. denies   Iron deficiency anemia    Long term (current) use of anticoagulants [Z79.01] 07/20/2016   Low back pain 06/12/2009   Qualifier: Diagnosis of  By: [name]    Nephrolithiasis    Orthostasis    OSA (obstructive sleep apnea)    PAF (paroxysmal atrial fibrillation) ([place])    a. dx 2017, started on amiodarone/warfarin but patient intermittently noncompliant.   PSVT (paroxysmal supraventricular tachycardia) ([place])    RBBB 07/09/2009   Qualifier: Diagnosis of  By: [name], MD, [place], [name]    RLS (restless legs syndrome) 09/19/2009   Qualifier: Diagnosis of  By: [name]   Overview:  July 2014: Controlled with Mirapex  Last Assessment & Plan:  Patient is doing well. Will continue current management and follow clinically.   Seizure disorder ([place]) 01/09/2017   Spondylosis    C5-6, C6-7 MRI 2010   Syncope 03/2016   Thyroid disease    Tubulovillous adenoma of colon 2007   Vitamin D deficiency 05/17/2017   Wears glasses     Past Surgical History:  Procedure Laterality Date   ANTERIOR CERVICAL DECOMP/DISCECTOMY FUSION N/A 05/31/2016   Procedure: ANTERIOR CERVICAL DECOMPRESSION/DISCECTOMY FUSION CERVICAL FIVE-SIX,CERVICAL SIX-SEVEN;  Surgeon: [name], MD;  Location: [facility];  Service: Neurosurgery;  Laterality: N/A;   CARDIAC CATHETERIZATION N/A 05/30/2015   Procedure: Left Heart Cath and Coronary Angiography;  Surgeon: [name], MD;  Location: [facility];  Service: Cardiovascular;  Laterality: N/A;   COLONOSCOPY     CORONARY ARTERY BYPASS GRAFT     2000   ESOPHAGOGASTRODUODENOSCOPY     LEFT HEART CATH AND CORS/GRAFTS ANGIOGRAPHY N/A 12/15/2017   Procedure: LEFT HEART CATH AND CORS/GRAFTS ANGIOGRAPHY;  Surgeon: [name], MD;  Location: [facility];  Service: Cardiovascular;  Laterality: N/A;   LEFT HEART CATHETERIZATION WITH CORONARY/GRAFT ANGIOGRAM N/A 08/01/2012   Procedure: LEFT HEART CATHETERIZATION WITH Beatrix Fetters;  Surgeon: [name], MD;  Location[facility];  Service: Cardiovascular;  Laterality: N/A;   LEFT HEART CATHETERIZATION WITH CORONARY/GRAFT ANGIOGRAM N/A 01/03/2015   Procedure: LEFT HEART CATHETERIZATION WITH Beatrix Fetters;  Surgeon: [name], MD;  Location: [facility] CATH LAB;  Service: Cardiovascular;  Laterality: N/A;   NASAL SEPTUM SURGERY     UP3   PERCUTANEOUS CORONARY STENT INTERVENTION (PCI-S)  10/2008   mRCA PCI  2.75 x 23 Abbott Xience V drug-eluting stent    ULTRASOUND GUIDANCE FOR VASCULAR ACCESS  12/15/2017   Procedure: Ultrasound Guidance For Vascular Access;  Surgeon: [name], MD;  Location: [facility];  Service: Cardiovascular;;    Family History  Problem Relation Age of Onset  Pancreatic cancer Brother    Diabetes Brother    Coronary artery disease Brother    Stroke Brother    Diabetes Brother    Diabetes Mother    Heart failure Mother    Heart failure Father    Hypothyroidism Brother    Coronary artery disease Brother    Other Brother        colon surgery   Heart attack Other        Nephew   Irregular heart beat Daughter    Cancer Maternal Grandmother        unknown    Colon cancer Neg Hx    Stomach cancer Neg Hx    Liver cancer Neg Hx    Rectal cancer Neg Hx    Esophageal cancer Neg Hx     Social History   Socioeconomic History   Marital status: Married    Spouse name: Not on file   Number of children: 4   Years of education: 13   Highest education level: Not on file  Occupational History   Occupation: retired  Scientist, product/process development strain: Not on file   Food insecurity    Worry: Not on file    Inability: Not on Energy manager needs    Medical: Not on file    Non-medical: Not on file  Tobacco Use   Smoking status: Never Smoker   Smokeless tobacco: Never Used  Substance and Sexual Activity   Alcohol use: No    Alcohol/week: 0.0 standard drinks    Comment: stopped drinking in 1998   Drug use: No   Sexual activity: Not on file  Lifestyle   Physical activity    Days per week: Not on file    Minutes per session: Not on file   Stress: Not on file  Relationships   Social connections    Talks on phone: Not on file    Gets together: Not on file    Attends religious service: Not on file    Active member of club or organization: Not on file    Attends meetings of clubs or organizations: Not on file    Relationship status: Not on file   Intimate partner violence    Fear of current or ex partner: Not on file    Emotionally abused: Not on file    Physically abused: Not on file    Forced sexual activity: Not on file  Other Topics Concern   Not on file  Social History Narrative   Patient is right handed.   Patient does not drink caffeine.    Outpatient Medications Prior to Visit  Medication Sig Dispense Refill   albuterol (VENTOLIN HFA) 108 (90 Base) MCG/ACT inhaler Inhale 2 puffs into the lungs every 6 (six) hours as needed for wheezing or shortness of breath. 18 g 2   apixaban (ELIQUIS) 5 MG TABS tablet Take 1 tablet (5 mg total) by mouth 2 (two) times daily. 60 tablet 5   atorvastatin (LIPITOR) 80 MG tablet Take 1 tablet (80 mg total) by mouth daily. 90 tablet 3   diclofenac sodium (VOLTAREN) 1 % GEL Apply 2 g topically daily as needed (for pain in affected area). 2 Tube 0   divalproex (DEPAKOTE ER) 250 MG 24 hr tablet Take 3 tablets (750 mg total) by mouth at bedtime. 180 tablet 0   ferrous sulfate 325 (65 FE) MG tablet Take 1 tablet (325 mg total) by mouth every other day.  3   furosemide (LASIX) 20 MG tablet Take 1 tablet (20 mg total) by mouth daily. 30 tablet 0    gabapentin (NEURONTIN) 100 MG capsule 1 capsule in the morning, 3 in the evening 120 capsule 3   glucose blood (ACCU-CHEK AVIVA) test strip Use as directed to check blood sugar 4 times daily.  DX E11.9 200 each 6   insulin aspart (NOVOLOG FLEXPEN) 100 UNIT/ML FlexPen INJECT 75-95 WITH BREAKFAST, 70 WITH LUNCH, AND 110 WITH SUPPER, 60 mL 0   insulin NPH Human (NOVOLIN N) 100 UNIT/ML injection Inject 0.6 mLs (60 Units total) into the skin at bedtime. And syringes 1/day 130 mL 0   isosorbide mononitrate (IMDUR) 60 MG 24 hr tablet TAKE TWO TABLETS BY MOUTH DAILY 180 tablet 0   levothyroxine (SYNTHROID, LEVOTHROID) 100 MCG tablet Take 1 tablet (100 mcg total) by mouth daily. 90 tablet 1   loperamide (IMODIUM A-D) 2 MG tablet Take 1 tablet (2 mg total) by mouth 4 (four) times daily as needed for diarrhea or loose stools. 30 tablet 0   metoprolol succinate (TOPROL-XL) 50 MG 24 hr tablet TAKE 1&1/2 TABLETS BY MOUTH ONCE A DAY FOR A TOTAL OF 75 MG. 135 tablet 3   midodrine (PROAMATINE) 10 MG tablet Take 2 tablets (20 mg total) by mouth daily. 180 tablet 2   nitroGLYCERIN (NITROSTAT) 0.4 MG SL tablet Place 1 tablet (0.4 mg total) under the tongue every 5 (five) minutes as needed for chest pain. 25 tablet 1   pantoprazole (PROTONIX) 40 MG tablet TAKE 1 TABLET (40 MG TOTAL) BY MOUTH DAILY. 90 tablet 0   pramipexole (MIRAPEX) 1 MG tablet TAKE 1/2 TO 1 TABLET AT BEDTIME 90 tablet 0   ranolazine (RANEXA) 500 MG 12 hr tablet TAKE 1 TABLET (500 MG TOTAL) BY MOUTH 2 (TWO) TIMES DAILY. 60 tablet 0   sertraline (ZOLOFT) 100 MG tablet TAKE 1 TABLET (100 MG TOTAL) BY MOUTH 2 (TWO) TIMES DAILY. 180 tablet 1   tiZANidine (ZANAFLEX) 4 MG tablet Take 1 tablet (4 mg total) by mouth every 6 (six) hours as needed for muscle spasms. 40 tablet 2   Vitamin D, Ergocalciferol, (DRISDOL) 1.25 MG (50000 UT) CAPS capsule Take 1 capsule (50,000 Units total) by mouth every 7 (seven) days. 20 capsule 0   No  facility-administered medications prior to visit.     Allergies  Allergen Reactions   Morphine Other (See Comments)    hallucinations   Shellfish-Derived Products Itching    Review of Systems  Constitutional: Positive for malaise/fatigue. Negative for fever.  HENT: Negative for congestion.   Eyes: Negative for blurred vision.  Respiratory: Positive for shortness of breath.   Cardiovascular: Negative for chest pain, palpitations and leg swelling.  Gastrointestinal: Negative for abdominal pain, blood in stool and nausea.  Genitourinary: Negative for dysuria and frequency.  Musculoskeletal: Positive for back pain. Negative for falls.  Skin: Negative for rash.  Neurological: Positive for dizziness and headaches. Negative for loss of consciousness.  Endo/Heme/Allergies: Negative for environmental allergies.  Psychiatric/Behavioral: Negative for depression. The patient is not nervous/anxious.        Objective:    Physical Exam unable to obtain via phone visit  Wt 206 lb (93.4 kg)    BMI 31.32 kg/m  Wt Readings from Last 3 Encounters:  08/14/19 206 lb (93.4 kg)  07/06/19 209 lb 9.6 oz (95.1 kg)  05/16/19 207 lb (93.9 kg)    Diabetic Foot Exam - Simple   No data  filed     Lab Results  Component Value Date   WBC 7.6 05/14/2019   HGB 13.4 05/14/2019   HCT 38.4 (L) 05/14/2019   PLT 176.0 05/14/2019   GLUCOSE 358 (H) 05/14/2019   CHOL 166 05/14/2019   TRIG 288.0 (H) 05/14/2019   HDL 29.60 (L) 05/14/2019   LDLDIRECT 111.0 05/14/2019   LDLCALC 32 07/17/2018   ALT 25 05/14/2019   AST 16 05/14/2019   NA 136 05/14/2019   K 4.8 05/14/2019   CL 103 05/14/2019   CREATININE 1.64 (H) 05/14/2019   BUN 21 05/14/2019   CO2 24 05/14/2019   TSH 2.58 05/14/2019   PSA 2.39 11/25/2010   INR 2.7 04/23/2019   HGBA1C 9.6 (A) 07/06/2019   MICROALBUR <0.7 05/14/2019    Lab Results  Component Value Date   TSH 2.58 05/14/2019   Lab Results  Component Value Date   WBC 7.6  05/14/2019   HGB 13.4 05/14/2019   HCT 38.4 (L) 05/14/2019   MCV 91.8 05/14/2019   PLT 176.0 05/14/2019   Lab Results  Component Value Date   NA 136 05/14/2019   K 4.8 05/14/2019   CO2 24 05/14/2019   GLUCOSE 358 (H) 05/14/2019   BUN 21 05/14/2019   CREATININE 1.64 (H) 05/14/2019   BILITOT 0.7 05/14/2019   ALKPHOS 97 05/14/2019   AST 16 05/14/2019   ALT 25 05/14/2019   PROT 6.8 05/14/2019   ALBUMIN 4.3 05/14/2019   CALCIUM 9.2 05/14/2019   ANIONGAP 11 11/07/2018   GFR 41.66 (L) 05/14/2019   Lab Results  Component Value Date   CHOL 166 05/14/2019   Lab Results  Component Value Date   HDL 29.60 (L) 05/14/2019   Lab Results  Component Value Date   LDLCALC 32 07/17/2018   Lab Results  Component Value Date   TRIG 288.0 (H) 05/14/2019   Lab Results  Component Value Date   CHOLHDL 6 05/14/2019   Lab Results  Component Value Date   HGBA1C 9.6 (A) 07/06/2019       Assessment & Plan:   Problem List Items Addressed This Visit    Dizziness    Still struggling but has an appt with neurology for further evaluation next month      Diabetes mellitus type II, uncontrolled ([place]) (Chronic)    He is now following with endocrinology and he notes his numbers are improving except his morning numbers are still hovering around 240 ish. He will try increasing his evening dose of novolin and novolog by 5 units and see if that improves his morning numbers.       Low back pain - Primary    He notes low back pain has worsened significantly in the past couple of weeks but he denies any trauma or issues with incontinence. Will proceed with xray and consider referral if pain persists. Labs ordered as well including UA with culture      Relevant Orders   DG Lumbar Spine 2-3 Views   PSA   Essential hypertension    Monitor vitals weekly and report any concerns no changes to meds. Encouraged heart healthy diet such as the DASH diet and exercise as tolerated.       Relevant Orders     CBC   Comprehensive metabolic panel   TSH   HLD (hyperlipidemia)   Relevant Orders   Lipid panel   DOE (dyspnea on exertion)    He notes this has worsened over past couple of weeks  but he denies any fevers, congestion, worsening cough. Proceed with CXR and echo and consider referral if does not improve.       Hypothyroidism due to acquired atrophy of thyroid   Relevant Orders   TSH   CKD (chronic kidney disease), stage III ([place])    Hydrate and monitor       Other Visit Diagnoses    Dysuria       Relevant Orders   Urine Culture   Urinalysis   PSA   DM (diabetes mellitus) type I uncontrolled with renal manifestation ([facility])       Relevant Orders   Hemoglobin A1c   Nocturia       Relevant Orders   PSA   Dyspnea, unspecified type       Relevant Orders   ECHOCARDIOGRAM COMPLETE   DG Chest 2 View      I am having Joshue A. Ingraham maintain his loperamide, diclofenac sodium, divalproex, nitroGLYCERIN, Vitamin D (Ergocalciferol), sertraline, glucose blood, levothyroxine, tiZANidine, atorvastatin, metoprolol succinate, furosemide, gabapentin, apixaban, isosorbide mononitrate, ferrous sulfate, pantoprazole, pramipexole, albuterol, insulin NPH Human, midodrine, NovoLOG FlexPen, and ranolazine.  No orders of the defined types were placed in this encounter.    I discussed the assessment and treatment plan with the patient. The patient was provided an opportunity to ask questions and all were answered. The patient agreed with the plan and demonstrated an understanding of the instructions.   The patient was advised to call back or seek an in-person evaluation if the symptoms worsen or if the condition fails to improve as anticipated.  I provided 25 minutes of non-face-to-face time during this encounter.   Penni Homans, MD

## 2019-08-15 NOTE — Assessment & Plan Note (Signed)
Hydrate and monitor 

## 2019-08-15 NOTE — Assessment & Plan Note (Signed)
Still struggling but has an appt with neurology for further evaluation next month

## 2019-08-15 NOTE — Assessment & Plan Note (Signed)
He notes this has worsened over past couple of weeks but he denies any fevers, congestion, worsening cough. Proceed with CXR and echo and consider referral if does not improve.

## 2019-08-15 NOTE — Assessment & Plan Note (Signed)
Monitor vitals weekly and report any concerns. no changes to meds. Encouraged heart healthy diet such as the DASH diet and exercise as tolerated.  

## 2019-08-20 ENCOUNTER — Ambulatory Visit (HOSPITAL_BASED_OUTPATIENT_CLINIC_OR_DEPARTMENT_OTHER)
Admission: RE | Admit: 2019-08-20 | Discharge: 2019-08-20 | Disposition: A | Discharge status: Home / Self Care | Payer: Medicare PPO | Source: Ambulatory Visit | Attending: Family Medicine | Admitting: Family Medicine

## 2019-08-20 ENCOUNTER — Other Ambulatory Visit: Payer: Self-pay

## 2019-08-20 ENCOUNTER — Other Ambulatory Visit (INDEPENDENT_AMBULATORY_CARE_PROVIDER_SITE_OTHER): Payer: Medicare PPO

## 2019-08-20 ENCOUNTER — Telehealth: Payer: Self-pay | Admitting: Family Medicine

## 2019-08-20 DIAGNOSIS — R351 Nocturia: Secondary | ICD-10-CM

## 2019-08-20 DIAGNOSIS — M545 Low back pain, unspecified: Secondary | ICD-10-CM

## 2019-08-20 DIAGNOSIS — IMO0002 Reserved for concepts with insufficient information to code with codable children: Secondary | ICD-10-CM

## 2019-08-20 DIAGNOSIS — E782 Mixed hyperlipidemia: Secondary | ICD-10-CM | POA: Diagnosis not present

## 2019-08-20 DIAGNOSIS — E1065 Type 1 diabetes mellitus with hyperglycemia: Secondary | ICD-10-CM

## 2019-08-20 DIAGNOSIS — R300 Dysuria: Secondary | ICD-10-CM | POA: Diagnosis not present

## 2019-08-20 DIAGNOSIS — E034 Atrophy of thyroid (acquired): Secondary | ICD-10-CM | POA: Diagnosis not present

## 2019-08-20 DIAGNOSIS — I10 Essential (primary) hypertension: Secondary | ICD-10-CM

## 2019-08-20 DIAGNOSIS — E1029 Type 1 diabetes mellitus with other diabetic kidney complication: Secondary | ICD-10-CM

## 2019-08-20 DIAGNOSIS — R0600 Dyspnea, unspecified: Secondary | ICD-10-CM | POA: Insufficient documentation

## 2019-08-20 LAB — LIPID PANEL
Cholesterol: 125 mg/dL (ref 0–200)
HDL: 30.7 mg/dL — ABNORMAL LOW (ref >39.00)
NonHDL: 94.48
Total CHOL/HDL Ratio: 4
Triglycerides: 248 mg/dL — ABNORMAL HIGH (ref 0.0–149.0)
VLDL: 49.6 mg/dL — ABNORMAL HIGH (ref 0.0–40.0)

## 2019-08-20 LAB — HEMOGLOBIN A1C: Hgb A1c MFr Bld: 9.1 % — ABNORMAL HIGH (ref 4.6–6.5)

## 2019-08-20 LAB — URINALYSIS
Bilirubin Urine: NEGATIVE
Hgb urine dipstick: NEGATIVE
Ketones, ur: NEGATIVE
Leukocytes,Ua: NEGATIVE
Nitrite: NEGATIVE
Specific Gravity, Urine: 1.025 (ref 1.000–1.030)
Total Protein, Urine: NEGATIVE
Urine Glucose: >=1000 — AB
Urobilinogen, UA: 0.2 (ref 0.0–1.0)
pH: 5 (ref 5.0–8.0)

## 2019-08-20 LAB — COMPREHENSIVE METABOLIC PANEL
ALT: 33 U/L (ref 0–53)
AST: 23 U/L (ref 0–37)
Albumin: 4.5 g/dL (ref 3.5–5.2)
Alkaline Phosphatase: 97 U/L (ref 39–117)
BUN: 21 mg/dL (ref 6–23)
CO2: 26 mEq/L (ref 19–32)
Calcium: 10.1 mg/dL (ref 8.4–10.5)
Chloride: 104 mEq/L (ref 96–112)
Creatinine, Ser: 1.43 mg/dL (ref 0.40–1.50)
GFR: 48.76 mL/min — ABNORMAL LOW (ref >60.00)
Glucose, Bld: 185 mg/dL — ABNORMAL HIGH (ref 70–99)
Potassium: 4 mEq/L (ref 3.5–5.1)
Sodium: 140 mEq/L (ref 135–145)
Total Bilirubin: 0.7 mg/dL (ref 0.2–1.2)
Total Protein: 7 g/dL (ref 6.0–8.3)

## 2019-08-20 LAB — TSH: TSH: 2.73 u[IU]/mL (ref 0.35–4.50)

## 2019-08-20 LAB — CBC
HCT: 41.5 % (ref 39.0–52.0)
Hemoglobin: 14.3 g/dL (ref 13.0–17.0)
MCHC: 34.4 g/dL (ref 30.0–36.0)
MCV: 91.3 fl (ref 78.0–100.0)
Platelets: 182 10*3/uL (ref 150.0–400.0)
RBC: 4.54 Mil/uL (ref 4.22–5.81)
RDW: 14.2 % (ref 11.5–15.5)
WBC: 8.8 10*3/uL (ref 4.0–10.5)

## 2019-08-20 LAB — PSA: PSA: 3.14 ng/mL (ref 0.10–4.00)

## 2019-08-20 LAB — LDL CHOLESTEROL, DIRECT: Direct LDL: 77 mg/dL

## 2019-08-20 NOTE — Telephone Encounter (Signed)
Copied from [place] [id number]. Topic: General - Other >> Aug 20, 2019  2:31 PM Keene Breath wrote: Reason for CRM: Patient called to request a referral for an ortho specialist after getting his x-rays.  Please call patient if there are any questions.  CB# [phone]

## 2019-08-21 NOTE — Telephone Encounter (Signed)
Left message on machine that referral has been placed. 

## 2019-08-22 ENCOUNTER — Telehealth: Payer: Self-pay

## 2019-08-22 ENCOUNTER — Other Ambulatory Visit: Payer: Self-pay | Admitting: *Deleted

## 2019-08-22 LAB — URINE CULTURE
MICRO NUMBER:: 877311
Result:: NO GROWTH
SPECIMEN QUALITY:: ADEQUATE

## 2019-08-22 MED ORDER — FENOFIBRATE MICRONIZED 130 MG PO CAPS: 130.0000 mg | ORAL_CAPSULE | Freq: Every day | ORAL | 3 refills | Status: DC

## 2019-08-22 NOTE — Telephone Encounter (Signed)
PA initiated via Covermymeds; KEY: [id number]. Awaiting determination.

## 2019-08-22 NOTE — Telephone Encounter (Signed)
Preferred alternatives: fenofibrate nanocrystallized tablets, fenofibrate tablets (48mg , 54mg , 145mg , 160mg ) and Gemfibrozil.

## 2019-08-22 NOTE — Telephone Encounter (Signed)
OK change to Fenofibrate 145 mg daily

## 2019-08-23 MED ORDER — FENOFIBRATE 145 MG PO TABS: 145.0000 mg | ORAL_TABLET | Freq: Every day | ORAL | 3 refills | Status: DC

## 2019-08-23 MED FILL — FENOFIBRATE 145 MG TABS: 145 | 30 days supply | Qty: 30 | Fill #0

## 2019-08-23 NOTE — Telephone Encounter (Signed)
rx sent in 

## 2019-08-28 ENCOUNTER — Other Ambulatory Visit: Payer: Self-pay | Admitting: Family Medicine

## 2019-08-28 ENCOUNTER — Other Ambulatory Visit: Payer: Self-pay

## 2019-08-28 ENCOUNTER — Other Ambulatory Visit: Payer: Self-pay | Admitting: Endocrinology

## 2019-08-28 DIAGNOSIS — E1165 Type 2 diabetes mellitus with hyperglycemia: Secondary | ICD-10-CM

## 2019-08-28 MED ORDER — NOVOLOG FLEXPEN 100 UNIT/ML ~~LOC~~ SOPN: 70.0000 [IU] | PEN_INJECTOR | SUBCUTANEOUS | 0 refills | Status: DC

## 2019-08-28 MED ORDER — "INSULIN SYRINGE 29G X 1/2"" 1 ML MISC": 1.0000 | Freq: Every day | 0 refills | Status: DC

## 2019-08-28 MED ORDER — INSULIN NPH (HUMAN) (ISOPHANE) 100 UNIT/ML ~~LOC~~ SUSP: 60.0000 [IU] | Freq: Every day | SUBCUTANEOUS | 0 refills | Status: DC

## 2019-08-28 MED ORDER — INSULIN PEN NEEDLE 31G X 8 MM MISC: 1.0000 | Freq: Two times a day (BID) | 0 refills | Status: DC

## 2019-08-28 MED FILL — ATORVASTATIN 80 MG TABLET: 80 | 90 days supply | Qty: 90 | Fill #2

## 2019-08-28 MED FILL — NOVOLOG FLEXPEN SYRINGE: 100 | 25 days supply | Qty: 45 | Fill #0

## 2019-08-28 MED FILL — LEVOTHYROXINE 100 MCG TAB: 100 | 90 days supply | Qty: 90 | Fill #0

## 2019-08-29 ENCOUNTER — Other Ambulatory Visit: Payer: Self-pay

## 2019-08-29 ENCOUNTER — Ambulatory Visit (HOSPITAL_BASED_OUTPATIENT_CLINIC_OR_DEPARTMENT_OTHER)
Admission: RE | Admit: 2019-08-29 | Discharge: 2019-08-29 | Disposition: A | Discharge status: Home / Self Care | Payer: Medicare PPO | Source: Ambulatory Visit | Attending: Family Medicine | Admitting: Family Medicine

## 2019-08-29 DIAGNOSIS — R0600 Dyspnea, unspecified: Secondary | ICD-10-CM | POA: Diagnosis not present

## 2019-08-29 MED FILL — ISOSORBIDE MN ER 60 MG TAB: 60 | 90 days supply | Qty: 180 | Fill #0

## 2019-08-29 NOTE — Progress Notes (Signed)
  Echocardiogram 2D Echocardiogram has been performed.  Cardell Peach 08/29/2019, 8:54 AM

## 2019-09-01 DIAGNOSIS — M5136 Other intervertebral disc degeneration, lumbar region: Secondary | ICD-10-CM | POA: Diagnosis not present

## 2019-09-01 DIAGNOSIS — M545 Low back pain: Secondary | ICD-10-CM | POA: Diagnosis not present

## 2019-09-04 ENCOUNTER — Other Ambulatory Visit: Payer: Self-pay

## 2019-09-06 ENCOUNTER — Ambulatory Visit (INDEPENDENT_AMBULATORY_CARE_PROVIDER_SITE_OTHER): Payer: Medicare PPO | Admitting: Endocrinology

## 2019-09-06 ENCOUNTER — Encounter: Payer: Self-pay | Admitting: Endocrinology

## 2019-09-06 ENCOUNTER — Other Ambulatory Visit: Payer: Self-pay

## 2019-09-06 DIAGNOSIS — E1165 Type 2 diabetes mellitus with hyperglycemia: Secondary | ICD-10-CM | POA: Diagnosis not present

## 2019-09-06 MED ORDER — FREESTYLE LIBRE 14 DAY READER DEVI: 1.0000 | Freq: Once | 0 refills | Status: AC

## 2019-09-06 MED ORDER — NOVOLOG FLEXPEN 100 UNIT/ML ~~LOC~~ SOPN: PEN_INJECTOR | SUBCUTANEOUS | 11 refills | Status: DC

## 2019-09-06 MED ORDER — FREESTYLE LIBRE 14 DAY SENSOR MISC: 1.0000 | 3 refills | Status: DC

## 2019-09-06 MED ORDER — INSULIN NPH (HUMAN) (ISOPHANE) 100 UNIT/ML ~~LOC~~ SUSP: 80.0000 [IU] | Freq: Every day | SUBCUTANEOUS | 11 refills | Status: DC

## 2019-09-06 MED FILL — FREESTYLE LIBRE 14 DAY READ: 30 days supply | Qty: 1 | Fill #0

## 2019-09-06 MED FILL — FREESTYLE LIBRE 14 DAY SENS: 28 days supply | Qty: 2 | Fill #0

## 2019-09-06 NOTE — Progress Notes (Signed)
Subjective:    Patient ID: [name] [name], male    DOB: [birth year], 73 y.o.   MRN: [id number]  HPI Pt returns for f/u of diabetes mellitus:  DM type: Insulin-requiring type 2.  Dx'ed: 3818 Complications: polyneuropathy, renal insuff, CAD and PAD.  Therapy: insulin since 2008 DKA: never Severe hypoglycemia: never Pancreatitis: never Pancreatic imaging: 2017 CT showed fatty replacement of the pancreatic head and body.  Other: [name] [name] takes multiple daily injections; [name] [name] takes human insulin at HS, due to cost.  Interval history: [name] [name] brings a record of his cbg's which I have reviewed today.  cbg's vary from 89-420.  It is in general lowest at lunch, and highest fasting.  [name] [name] says [name] [name] seldom misses the insulin.  pt states [name] [name] feels well in general.  [name] [name] takes novolog 3 times a day (just before each meal) 80-80-115 units, and NPH, 60 units QHS.  [name] [name] checks cbg TID.   Past Medical History:  Diagnosis Date  . Acquired atrophy of thyroid 06/05/2013   Overview:  July 2014: controlled on synthroid 42mcg since 2012 May 2015: decreased to Synthroid 50 mcg  Last Assessment & Plan:  Pt doing well with most recent TSH within normal range.  Will continue current dosage of Synthroid 42mcg. Pt reminded to take medication on empty stomach. Will continue to monitor with periodic laboratory assessment in 3 months.   . Anemia    hemoglobin 7.4, iron deficiency, January, 2011, 2 unit transfusion, endoscopy normal, capsule endoscopy February, 2011 no small bowel abnormalities.   Most likely source gastric erosions, followed by GI  . Anxiety   . Bradycardia   . CAD (coronary artery disease)    A. CABG in 2000,status post cardiac cath in 2006, 2009 ....continued chest pain and SOB despite oral medication adjestments including Ranexa. B. Cath November 2009/ mRCA - 2.75 x 23 Abbott Xience V drug-eluting stent ...11/26/2008 to distal  RCA leading to acute marginal.  C. Cath 07/2012 for CP - stable anatomy, med rx. d. cath 2015 and  05/30/2015 stable anatomy, consider Myoview if has CP again  . Carotid artery disease ([place]) 08/01/2015   Doppler, May 29, 2015, 1-39% bilateral ICA   . Cerebral ischemia    MRI November, 2010, chronic microvascular ischemia  . CKD (chronic kidney disease), stage III   . Concussion   . Depression    Bipolar  . Diabetes mellitus ([place])   . Edema   . Essential hypertension 06/05/2013   Overview:  July 2014: Controlled with Bystolic Sep 2993: Changed to atenolol.  Last Assessment & Plan:  Will change to atenolol for cost and follow Improved.  Medication compliance strongly encouraged BP: 116/64 mmHg    Overview:  July 2014: Controlled with Bystolic Sep 7169: Changed to atenolol.  Last Assessment & Plan:  Will change to atenolol for cost and follow  . Falling episodes    these have occurred in the past and again recurring 2011  . Family history of adverse reaction to anesthesia    "mother died during bypass surgery but not sure if it has to do with anesthesia"  . Gastric ulcer   . GERD (gastroesophageal reflux disease)   . H/O medication noncompliance    Due to loss of insurance  . H/O multiple concussions   . Hard of hearing   . History of blood transfusion 12/20/2013  . Hx of CABG    2000,  / one median sternotomy suture broken her chest x-ray November, 2010, no clinical  significance  . Hyperlipidemia   . Hypertension    pt. denies  . Iron deficiency anemia   . Long term (current) use of anticoagulants [Z79.01] 07/20/2016  . Low back pain 06/12/2009   Qualifier: Diagnosis of  By: [name]   . Nephrolithiasis   . Orthostasis   . OSA (obstructive sleep apnea)   . PAF (paroxysmal atrial fibrillation) ([place])    a. dx 2017, started on amiodarone/warfarin but patient intermittently noncompliant.  Marland Kitchen PSVT (paroxysmal supraventricular tachycardia) ([place])   . RBBB 07/09/2009   Qualifier: Diagnosis of  By: [name], MD, [name] [name]   . RLS (restless legs syndrome) 09/19/2009   Qualifier:  Diagnosis of  By: [name]   Overview:  July 2014: Controlled with Mirapex  Last Assessment & Plan:  Patient is doing well. Will continue current management and follow clinically.  . Seizure disorder ([place]) 01/09/2017  . Spondylosis    C5-6, C6-7 MRI 2010  . Syncope 03/2016  . Thyroid disease   . Tubulovillous adenoma of colon 2007  . Vitamin D deficiency 05/17/2017  . Wears glasses     Past Surgical History:  Procedure Laterality Date  . ANTERIOR CERVICAL DECOMP/DISCECTOMY FUSION N/A 05/31/2016   Procedure: ANTERIOR CERVICAL DECOMPRESSION/DISCECTOMY FUSION CERVICAL FIVE-SIX,CERVICAL SIX-SEVEN;  Surgeon: [name], MD;  Location: [facility];  Service: Neurosurgery;  Laterality: N/A;  . CARDIAC CATHETERIZATION N/A 05/30/2015   Procedure: Left Heart Cath and Coronary Angiography;  Surgeon: [name], MD;  Location: [facility];  Service: Cardiovascular;  Laterality: N/A;  . COLONOSCOPY    . CORONARY ARTERY BYPASS GRAFT     2000  . ESOPHAGOGASTRODUODENOSCOPY    . LEFT HEART CATH AND CORS/GRAFTS ANGIOGRAPHY N/A 12/15/2017   Procedure: LEFT HEART CATH AND CORS/GRAFTS ANGIOGRAPHY;  Surgeon: [name], MD;  Location: [facility];  Service: Cardiovascular;  Laterality: N/A;  . LEFT HEART CATHETERIZATION WITH CORONARY/GRAFT ANGIOGRAM N/A 08/01/2012   Procedure: LEFT HEART CATHETERIZATION WITH Beatrix Fetters;  Surgeon: [name], MD;  Location[facility];  Service: Cardiovascular;  Laterality: N/A;  . LEFT HEART CATHETERIZATION WITH CORONARY/GRAFT ANGIOGRAM N/A 01/03/2015   Procedure: LEFT HEART CATHETERIZATION WITH Beatrix Fetters;  Surgeon: [name], MD;  Location: [facility] C[facility];  Service: Cardiovascular;  Laterality: N/A;  . NASAL SEPTUM SURGERY     UP3  . PERCUTANEOUS CORONARY STENT INTERVENTION (PCI-S)  10/2008   mRCA PCI  2.75 x 23 Abbott Xience V drug-eluting stent   . ULTRASOUND GUIDANCE FOR VASCULAR ACCESS  12/15/2017    Procedure: Ultrasound Guidance For Vascular Access;  Surgeon: [name], MD;  Location: [facility];  Service: Cardiovascular;;    Social History   Socioeconomic History  . Marital status: Married    Spouse name: Not on file  . Number of children: 4  . Years of education: 39  . Highest education level: Not on file  Occupational History  . Occupation: retired  Scientific laboratory technician  . Financial resource strain: Not on file  . Food insecurity    Worry: Not on file    Inability: Not on file  . Transportation needs    Medical: Not on file    Non-medical: Not on file  Tobacco Use  . Smoking status: Never Smoker  . Smokeless tobacco: Never Used  Substance and Sexual Activity  . Alcohol use: No    Alcohol/week: 0.0 standard drinks    Comment:  stopped drinking in 1998  . Drug use: No  . Sexual activity: Not on file  Lifestyle  . Physical activity    Days per week: Not on file    Minutes per session: Not on file  . Stress: Not on file  Relationships  . Social Herbalist on phone: Not on file    Gets together: Not on file    Attends religious service: Not on file    Active member of club or organization: Not on file    Attends meetings of clubs or organizations: Not on file    Relationship status: Not on file  . Intimate partner violence    Fear of current or ex partner: Not on file    Emotionally abused: Not on file    Physically abused: Not on file    Forced sexual activity: Not on file  Other Topics Concern  . Not on file  Social History Narrative   Patient is right handed.   Patient does not drink caffeine.    Current Outpatient Medications on File Prior to Visit  Medication Sig Dispense Refill  . albuterol (VENTOLIN HFA) 108 (90 Base) MCG/ACT inhaler Inhale 2 puffs into the lungs every 6 (six) hours as needed for wheezing or shortness of breath. 18 g 2  . apixaban (ELIQUIS) 5 MG TABS tablet Take 1 tablet (5 mg total) by mouth 2 (two) times  daily. 60 tablet 5  . atorvastatin (LIPITOR) 80 MG tablet Take 1 tablet (80 mg total) by mouth daily. 90 tablet 3  . diclofenac sodium (VOLTAREN) 1 % GEL Apply 2 g topically daily as needed (for pain in affected area). 2 Tube 0  . divalproex (DEPAKOTE ER) 250 MG 24 hr tablet Take 3 tablets (750 mg total) by mouth at bedtime. 180 tablet 0  . fenofibrate (TRICOR) 145 MG tablet Take 1 tablet (145 mg total) by mouth daily. 30 tablet 3  . ferrous sulfate 325 (65 FE) MG tablet Take 1 tablet (325 mg total) by mouth every other day.  3  . furosemide (LASIX) 20 MG tablet Take 1 tablet (20 mg total) by mouth daily. 30 tablet 0  . gabapentin (NEURONTIN) 100 MG capsule 1 capsule in the morning, 3 in the evening 120 capsule 3  . glucose blood (ACCU-CHEK AVIVA) test strip Use as directed to check blood sugar 4 times daily.  DX E11.9 200 each 6  . Insulin Pen Needle 31G X 8 MM MISC 1 each by Does not apply route 2 (two) times daily. Use to inject insulin BID 60 each 0  . INSULIN SYRINGE 1CC/29G 29G X 1/2" 1 ML MISC 1 each by Does not apply route daily. Use to inject insulin daily 30 each 0  . isosorbide mononitrate (IMDUR) 60 MG 24 hr tablet TAKE TWO TABLETS BY MOUTH DAILY 180 tablet 0  . levothyroxine (SYNTHROID, LEVOTHROID) 100 MCG tablet Take 1 tablet (100 mcg total) by mouth daily. 90 tablet 1  . loperamide (IMODIUM A-D) 2 MG tablet Take 1 tablet (2 mg total) by mouth 4 (four) times daily as needed for diarrhea or loose stools. 30 tablet 0  . metoprolol succinate (TOPROL-XL) 50 MG 24 hr tablet TAKE 1&1/2 TABLETS BY MOUTH ONCE A DAY FOR A TOTAL OF 75 MG. 135 tablet 3  . midodrine (PROAMATINE) 10 MG tablet Take 2 tablets (20 mg total) by mouth daily. 180 tablet 2  . nitroGLYCERIN (NITROSTAT) 0.4 MG SL tablet Place 1 tablet (0.4  mg total) under the tongue every 5 (five) minutes as needed for chest pain. 25 tablet 1  . pantoprazole (PROTONIX) 40 MG tablet TAKE 1 TABLET (40 MG TOTAL) BY MOUTH DAILY. 90 tablet 0  .  pramipexole (MIRAPEX) 1 MG tablet TAKE 1/2 TO 1 TABLET AT BEDTIME 90 tablet 0  . sertraline (ZOLOFT) 100 MG tablet TAKE 1 TABLET (100 MG TOTAL) BY MOUTH 2 (TWO) TIMES DAILY. 180 tablet 1  . tiZANidine (ZANAFLEX) 4 MG tablet Take 1 tablet (4 mg total) by mouth every 6 (six) hours as needed for muscle spasms. 40 tablet 2  . Vitamin D, Ergocalciferol, (DRISDOL) 1.25 MG (50000 UT) CAPS capsule Take 1 capsule (50,000 Units total) by mouth every 7 (seven) days. 20 capsule 0   No current facility-administered medications on file prior to visit.     Allergies  Allergen Reactions  . Morphine Other (See Comments)    hallucinations  . Shellfish-Derived Products Itching    Family History  Problem Relation Age of Onset  . Pancreatic cancer Brother   . Diabetes Brother   . Coronary artery disease Brother   . Stroke Brother   . Diabetes Brother   . Diabetes Mother   . Heart failure Mother   . Heart failure Father   . Hypothyroidism Brother   . Coronary artery disease Brother   . Other Brother        colon surgery  . Heart attack Other        Nephew  . Irregular heart beat Daughter   . Cancer Maternal Grandmother        unknown   . Colon cancer Neg Hx   . Stomach cancer Neg Hx   . Liver cancer Neg Hx   . Rectal cancer Neg Hx   . Esophageal cancer Neg Hx     BP 140/70 (BP Location: Left Arm, Patient Position: Sitting, Cuff Size: Normal)   Pulse 69   Ht 5\' 8"  (1.727 m)   Wt 216 lb 3.2 oz (98.1 kg)   SpO2 98%   BMI 32.87 kg/m    Review of Systems [name] [name] denies hypoglycemia.      Objective:   Physical Exam VITAL SIGNS:  See vs page GENERAL: no distress Pulses: dorsalis pedis intact bilat.   MSK: no deformity of the feet CV: no leg edema Skin:  no ulcer on the feet.  normal color and temp on the feet. Neuro: sensation is intact to touch on the feet    Lab Results  Component Value Date   HGBA1C 9.1 (H) 08/20/2019   Lab Results  Component Value Date   CREATININE 1.43  08/20/2019   BUN 21 08/20/2019   NA 140 08/20/2019   K 4.0 08/20/2019   CL 104 08/20/2019   CO2 26 08/20/2019       Assessment & Plan:  HTN: is noted today Insulin-requiring type 2 DM, with CAD: [name] [name] needs increased rx Renal insuff: in this setting, [name] [name] needs mostly mealtime insulin  Patient Instructions  Your blood pressure is high today.  Please see your primary care provider soon, to have it rechecked Please change the insulin numbers to those below.   check your blood sugar 4 times a day: before the 3 meals, and at bedtime.  also check if you have symptoms of your blood sugar being too high or too low.  please keep a record of the readings and bring it to your next appointment here (or you can bring the  meter itself).  You can write it on any piece of paper.  please call us sooner if your blood sugar goes below 70, or if you have a lot of readings over 200.  I have sent a prescription to your pharmacy, for the continuous glucose monitor.  Please let us know if you need help with this. Please come back for a follow-up appointment in 2 months.

## 2019-09-06 NOTE — Patient Instructions (Addendum)
Your blood pressure is high today.  Please see your primary care provider soon, to have it rechecked Please change the insulin numbers to those below.   check your blood sugar 4 times a day: before the 3 meals, and at bedtime.  also check if you have symptoms of your blood sugar being too high or too low.  please keep a record of the readings and bring it to your next appointment here (or you can bring the meter itself).  You can write it on any piece of paper.  please call us sooner if your blood sugar goes below 70, or if you have a lot of readings over 200.  I have sent a prescription to your pharmacy, for the continuous glucose monitor.  Please let us know if you need help with this. Please come back for a follow-up appointment in 2 months.

## 2019-09-07 ENCOUNTER — Other Ambulatory Visit: Payer: Self-pay | Admitting: Family Medicine

## 2019-09-07 MED FILL — ELIQUIS 5 MG TABLET: 5 | 30 days supply | Qty: 60 | Fill #4

## 2019-09-07 MED FILL — RANOLAZINE ER 500 MG TABLET: 500 | 30 days supply | Qty: 60 | Fill #0

## 2019-09-10 MED FILL — ALBUTEROL SULFATE HFA 108 (: 108 (90 BAS | 25 days supply | Qty: 18 | Fill #1

## 2019-09-13 ENCOUNTER — Other Ambulatory Visit: Payer: Self-pay | Admitting: Family Medicine

## 2019-09-14 MED FILL — NovoLIN N 100 UNIT/ML SUSP: 100 | 38 days supply | Qty: 30 | Fill #0

## 2019-09-14 MED FILL — NOVOLOG FLEXPEN SYRINGE: 100 | 11 days supply | Qty: 30 | Fill #0

## 2019-09-17 ENCOUNTER — Telehealth: Payer: Self-pay

## 2019-09-17 NOTE — Telephone Encounter (Signed)
I contacted the pt and left a vm asking the pt to call me back so we could reschedule 09/21/2019 appt due to office being closed on Fridays.  Pt was provided with [facility] #.

## 2019-09-21 ENCOUNTER — Ambulatory Visit: Payer: Medicare PPO | Admitting: Neurology

## 2019-09-25 ENCOUNTER — Ambulatory Visit (INDEPENDENT_AMBULATORY_CARE_PROVIDER_SITE_OTHER): Payer: Medicare PPO | Admitting: Family Medicine

## 2019-09-25 ENCOUNTER — Other Ambulatory Visit: Payer: Self-pay

## 2019-09-25 DIAGNOSIS — R519 Headache, unspecified: Secondary | ICD-10-CM

## 2019-09-25 DIAGNOSIS — N183 Chronic kidney disease, stage 3 unspecified: Secondary | ICD-10-CM | POA: Diagnosis not present

## 2019-09-25 DIAGNOSIS — I10 Essential (primary) hypertension: Secondary | ICD-10-CM

## 2019-09-25 DIAGNOSIS — M5431 Sciatica, right side: Secondary | ICD-10-CM | POA: Diagnosis not present

## 2019-09-25 DIAGNOSIS — E1165 Type 2 diabetes mellitus with hyperglycemia: Secondary | ICD-10-CM | POA: Diagnosis not present

## 2019-09-25 MED FILL — FENOFIBRATE 145 MG TAB: 145 | 30 days supply | Qty: 30 | Fill #1

## 2019-09-25 NOTE — Assessment & Plan Note (Signed)
Is having occasional electrical shooting pains down side of right leg. Occurs roughly daily and is fleeting. He is encouraged to stay active and can try lidocaine patches. Report if worsens.

## 2019-09-25 NOTE — Assessment & Plan Note (Signed)
He noted a concern that his sugar is highest in am sometimes at 140 or 150 but he did have one episode of low sugar at 47 a couple of weeks ago. No change in Novolin dose for now. He has appt with endocrinology in early December.

## 2019-09-25 NOTE — Assessment & Plan Note (Signed)
Hydrate and monitor. Minimize OTC meds without clearing with [place].

## 2019-09-25 NOTE — Assessment & Plan Note (Signed)
Monitor and report any concerns, no changes to meds. Encouraged heart healthy diet such as the DASH diet and exercise as tolerated.  ?

## 2019-09-25 NOTE — Assessment & Plan Note (Signed)
Have been some better but does have an appointment with neurology early next month.

## 2019-09-25 NOTE — Progress Notes (Signed)
Patient ID: [name] [name], male   DOB: 10-07-1946, 73 y.o.   MRN: [id number] Virtual Visit via phone Note  I connected with Cinda Quest Zabinski on 09/25/19 at  3:20 PM EDT by a phone enabled telemedicine application and verified that I am speaking with the correct person using two identifiers.  Location: Patient: home Provider: office   I discussed the limitations of evaluation and management by telemedicine and the availability of in person appointments. The patient expressed understanding and agreed to proceed. Princess Eulas Post CMA was able to set patient up on phone visit after being unable to set up video visit   Subjective:    Patient ID: [name] [name], male    DOB: 1946-11-11, 73 y.o.   MRN: [id number]  No chief complaint on file.   HPI Patient is in today for chronic medical concerns such as diabetes, hypertension, headaches and more. No recent febrile illness or hospitalizations. No recent febrile illness or hospitalizations. No polyuria or polydipsia. His headaches are still occurring but less intense. Notes a recent flare in intermittent shooting pains down right leg. Only lasts a moment but is intense. No recent fall or injury. Denies CP/palp/SOB/congestion/fevers/GI or GU c/o. Taking meds as prescribed  Past Medical History:  Diagnosis Date  . Acquired atrophy of thyroid 06/05/2013   Overview:  July 2014: controlled on synthroid 34mcg since 2012 May 2015: decreased to Synthroid 50 mcg  Last Assessment & Plan:  Pt doing well with most recent TSH within normal range.  Will continue current dosage of Synthroid 31mcg. Pt reminded to take medication on empty stomach. Will continue to monitor with periodic laboratory assessment in 3 months.   . Anemia    hemoglobin 7.4, iron deficiency, January, 2011, 2 unit transfusion, endoscopy normal, capsule endoscopy February, 2011 no small bowel abnormalities.   Most likely source gastric erosions, followed by GI  . Anxiety   . Bradycardia    . CAD (coronary artery disease)    A. CABG in 2000,status post cardiac cath in 2006, 2009 ....continued chest pain and SOB despite oral medication adjestments including Ranexa. B. Cath November 2009/ mRCA - 2.75 x 23 Abbott Xience V drug-eluting stent ...11/26/2008 to distal  RCA leading to acute marginal.  C. Cath 07/2012 for CP - stable anatomy, med rx. d. cath 2015 and 05/30/2015 stable anatomy, consider Myoview if has CP again  . Carotid artery disease ([place]) 08/01/2015   Doppler, May 29, 2015, 1-39% bilateral ICA   . Cerebral ischemia    MRI November, 2010, chronic microvascular ischemia  . CKD (chronic kidney disease), stage III   . Concussion   . Depression    Bipolar  . Diabetes mellitus ([place])   . Edema   . Essential hypertension 06/05/2013   Overview:  July 2014: Controlled with Bystolic Sep 5956: Changed to atenolol.  Last Assessment & Plan:  Will change to atenolol for cost and follow Improved.  Medication compliance strongly encouraged BP: 116/64 mmHg    Overview:  July 2014: Controlled with Bystolic Sep 3875: Changed to atenolol.  Last Assessment & Plan:  Will change to atenolol for cost and follow  . Falling episodes    these have occurred in the past and again recurring 2011  . Family history of adverse reaction to anesthesia    "mother died during bypass surgery but not sure if it has to do with anesthesia"  . Gastric ulcer   . GERD (gastroesophageal reflux disease)   . H/O medication noncompliance  Due to loss of insurance  . H/O multiple concussions   . Hard of hearing   . History of blood transfusion 12/20/2013  . Hx of CABG    2000,  / one median sternotomy suture broken her chest x-ray November, 2010, no clinical significance  . Hyperlipidemia   . Hypertension    pt. denies  . Iron deficiency anemia   . Long term (current) use of anticoagulants [Z79.01] 07/20/2016  . Low back pain 06/12/2009   Qualifier: Diagnosis of  By: [name]   . Nephrolithiasis   .  Orthostasis   . OSA (obstructive sleep apnea)   . PAF (paroxysmal atrial fibrillation) ([place])    a. dx 2017, started on amiodarone/warfarin but patient intermittently noncompliant.  Marland Kitchen PSVT (paroxysmal supraventricular tachycardia) ([place])   . RBBB 07/09/2009   Qualifier: Diagnosis of  By: [name], MD, [name] [name]   . RLS (restless legs syndrome) 09/19/2009   Qualifier: Diagnosis of  By: [name]   Overview:  July 2014: Controlled with Mirapex  Last Assessment & Plan:  Patient is doing well. Will continue current management and follow clinically.  . Seizure disorder ([place]) 01/09/2017  . Spondylosis    C5-6, C6-7 MRI 2010  . Syncope 03/2016  . Thyroid disease   . Tubulovillous adenoma of colon 2007  . Vitamin D deficiency 05/17/2017  . Wears glasses     Past Surgical History:  Procedure Laterality Date  . ANTERIOR CERVICAL DECOMP/DISCECTOMY FUSION N/A 05/31/2016   Procedure: ANTERIOR CERVICAL DECOMPRESSION/DISCECTOMY FUSION CERVICAL FIVE-SIX,CERVICAL SIX-SEVEN;  Surgeon: [name], MD;  Location: [facility];  Service: Neurosurgery;  Laterality: N/A;  . CARDIAC CATHETERIZATION N/A 05/30/2015   Procedure: Left Heart Cath and Coronary Angiography;  Surgeon: [name], MD;  Location: [facility];  Service: Cardiovascular;  Laterality: N/A;  . COLONOSCOPY    . CORONARY ARTERY BYPASS GRAFT     2000  . ESOPHAGOGASTRODUODENOSCOPY    . LEFT HEART CATH AND CORS/GRAFTS ANGIOGRAPHY N/A 12/15/2017   Procedure: LEFT HEART CATH AND CORS/GRAFTS ANGIOGRAPHY;  Surgeon: [name], MD;  Location: [facility];  Service: Cardiovascular;  Laterality: N/A;  . LEFT HEART CATHETERIZATION WITH CORONARY/GRAFT ANGIOGRAM N/A 08/01/2012   Procedure: LEFT HEART CATHETERIZATION WITH Beatrix Fetters;  Surgeon: [name], MD;  Location[facility];  Service: Cardiovascular;  Laterality: N/A;  . LEFT HEART CATHETERIZATION WITH CORONARY/GRAFT ANGIOGRAM N/A 01/03/2015    Procedure: LEFT HEART CATHETERIZATION WITH Beatrix Fetters;  Surgeon: [name], MD;  Location: [facility] CATH LAB;  Service: Cardiovascular;  Laterality: N/A;  . NASAL SEPTUM SURGERY     UP3  . PERCUTANEOUS CORONARY STENT INTERVENTION (PCI-S)  10/2008   mRCA PCI  2.75 x 23 Abbott Xience V drug-eluting stent   . ULTRASOUND GUIDANCE FOR VASCULAR ACCESS  12/15/2017   Procedure: Ultrasound Guidance For Vascular Access;  Surgeon: [name], MD;  Location: [facility];  Service: Cardiovascular;;    Family History  Problem Relation Age of Onset  . Pancreatic cancer Brother   . Diabetes Brother   . Coronary artery disease Brother   . Stroke Brother   . Diabetes Brother   . Diabetes Mother   . Heart failure Mother   . Heart failure Father   . Hypothyroidism Brother   . Coronary artery disease Brother   . Other Brother        colon surgery  . Heart attack  Other        Nephew  . Irregular heart beat Daughter   . Cancer Maternal Grandmother        unknown   . Colon cancer Neg Hx   . Stomach cancer Neg Hx   . Liver cancer Neg Hx   . Rectal cancer Neg Hx   . Esophageal cancer Neg Hx     Social History   Socioeconomic History  . Marital status: Married    Spouse name: Not on file  . Number of children: 4  . Years of education: 27  . Highest education level: Not on file  Occupational History  . Occupation: retired  Scientific laboratory technician  . Financial resource strain: Not on file  . Food insecurity    Worry: Not on file    Inability: Not on file  . Transportation needs    Medical: Not on file    Non-medical: Not on file  Tobacco Use  . Smoking status: Never Smoker  . Smokeless tobacco: Never Used  Substance and Sexual Activity  . Alcohol use: No    Alcohol/week: 0.0 standard drinks    Comment: stopped drinking in 1998  . Drug use: No  . Sexual activity: Not on file  Lifestyle  . Physical activity    Days per week: Not on file    Minutes per session:  Not on file  . Stress: Not on file  Relationships  . Social Herbalist on phone: Not on file    Gets together: Not on file    Attends religious service: Not on file    Active member of club or organization: Not on file    Attends meetings of clubs or organizations: Not on file    Relationship status: Not on file  . Intimate partner violence    Fear of current or ex partner: Not on file    Emotionally abused: Not on file    Physically abused: Not on file    Forced sexual activity: Not on file  Other Topics Concern  . Not on file  Social History Narrative   Patient is right handed.   Patient does not drink caffeine.    Outpatient Medications Prior to Visit  Medication Sig Dispense Refill  . albuterol (VENTOLIN HFA) 108 (90 Base) MCG/ACT inhaler Inhale 2 puffs into the lungs every 6 (six) hours as needed for wheezing or shortness of breath. 18 g 2  . apixaban (ELIQUIS) 5 MG TABS tablet Take 1 tablet (5 mg total) by mouth 2 (two) times daily. 60 tablet 5  . atorvastatin (LIPITOR) 80 MG tablet Take 1 tablet (80 mg total) by mouth daily. 90 tablet 3  . Continuous Blood Gluc Sensor (FREESTYLE LIBRE 14 DAY SENSOR) MISC 1 Device by Does not apply route every 14 (fourteen) days. 6 each 3  . diclofenac sodium (VOLTAREN) 1 % GEL Apply 2 g topically daily as needed (for pain in affected area). 2 Tube 0  . divalproex (DEPAKOTE ER) 250 MG 24 hr tablet Take 3 tablets (750 mg total) by mouth at bedtime. 180 tablet 0  . fenofibrate (TRICOR) 145 MG tablet Take 1 tablet (145 mg total) by mouth daily. 30 tablet 3  . ferrous sulfate 325 (65 FE) MG tablet Take 1 tablet (325 mg total) by mouth every other day.  3  . furosemide (LASIX) 20 MG tablet Take 1 tablet (20 mg total) by mouth daily. 30 tablet 0  . gabapentin (NEURONTIN) 100 MG  capsule 1 capsule in the morning, 3 in the evening 120 capsule 3  . glucose blood (ACCU-CHEK AVIVA) test strip Use as directed to check blood sugar 4 times daily.   DX E11.9 200 each 6  . insulin aspart (NOVOLOG FLEXPEN) 100 UNIT/ML FlexPen 3 times a day (just before each meal) 70-80-115 units, and syringes 4/day 30 pen 11  . insulin NPH Human (NOVOLIN N) 100 UNIT/ML injection Inject 0.8 mLs (80 Units total) into the skin at bedtime. 30 mL 11  . Insulin Pen Needle 31G X 8 MM MISC 1 each by Does not apply route 2 (two) times daily. Use to inject insulin BID 60 each 0  . INSULIN SYRINGE 1CC/29G 29G X 1/2" 1 ML MISC 1 each by Does not apply route daily. Use to inject insulin daily 30 each 0  . isosorbide mononitrate (IMDUR) 60 MG 24 hr tablet TAKE TWO TABLETS BY MOUTH DAILY 180 tablet 0  . levothyroxine (SYNTHROID, LEVOTHROID) 100 MCG tablet Take 1 tablet (100 mcg total) by mouth daily. 90 tablet 1  . loperamide (IMODIUM A-D) 2 MG tablet Take 1 tablet (2 mg total) by mouth 4 (four) times daily as needed for diarrhea or loose stools. 30 tablet 0  . metoprolol succinate (TOPROL-XL) 50 MG 24 hr tablet TAKE 1&1/2 TABLETS BY MOUTH ONCE A DAY FOR A TOTAL OF 75 MG. 135 tablet 3  . midodrine (PROAMATINE) 10 MG tablet Take 2 tablets (20 mg total) by mouth daily. 180 tablet 2  . nitroGLYCERIN (NITROSTAT) 0.4 MG SL tablet Place 1 tablet (0.4 mg total) under the tongue every 5 (five) minutes as needed for chest pain. 25 tablet 1  . pantoprazole (PROTONIX) 40 MG tablet TAKE 1 TABLET (40 MG TOTAL) BY MOUTH DAILY. 90 tablet 0  . pramipexole (MIRAPEX) 1 MG tablet TAKE 1/2 TO 1 TABLET AT BEDTIME 90 tablet 0  . ranolazine (RANEXA) 500 MG 12 hr tablet TAKE 1 TABLET (500 MG TOTAL) BY MOUTH 2 (TWO) TIMES DAILY. 60 tablet 0  . sertraline (ZOLOFT) 100 MG tablet TAKE 1 TABLET (100 MG TOTAL) BY MOUTH 2 (TWO) TIMES DAILY. 180 tablet 1  . tiZANidine (ZANAFLEX) 4 MG tablet Take 1 tablet (4 mg total) by mouth every 6 (six) hours as needed for muscle spasms. 40 tablet 2  . Vitamin D, Ergocalciferol, (DRISDOL) 1.25 MG (50000 UT) CAPS capsule Take 1 capsule (50,000 Units total) by mouth every 7  (seven) days. 20 capsule 0   No facility-administered medications prior to visit.     Allergies  Allergen Reactions  . Morphine Other (See Comments)    hallucinations  . Shellfish-Derived Products Itching    Review of Systems  Constitutional: Positive for malaise/fatigue. Negative for fever.  HENT: Negative for congestion.   Eyes: Negative for blurred vision.  Respiratory: Negative for shortness of breath.   Cardiovascular: Negative for chest pain, palpitations and leg swelling.  Gastrointestinal: Negative for abdominal pain, blood in stool and nausea.  Genitourinary: Negative for dysuria and frequency.  Musculoskeletal: Positive for joint pain. Negative for falls.  Skin: Negative for rash.  Neurological: Positive for headaches. Negative for dizziness and loss of consciousness.  Endo/Heme/Allergies: Negative for environmental allergies.  Psychiatric/Behavioral: Negative for depression. The patient is not nervous/anxious.        Objective:    Physical Exam unable to obtain via phone visit  Wt 211 lb (95.7 kg)   BMI 32.08 kg/m  Wt Readings from Last 3 Encounters:  09/25/19 211 lb (  95.7 kg)  09/06/19 216 lb 3.2 oz (98.1 kg)  08/14/19 206 lb (93.4 kg)    Diabetic Foot Exam - Simple   No data filed     Lab Results  Component Value Date   WBC 8.8 08/20/2019   HGB 14.3 08/20/2019   HCT 41.5 08/20/2019   PLT 182.0 08/20/2019   GLUCOSE 185 (H) 08/20/2019   CHOL 125 08/20/2019   TRIG 248.0 (H) 08/20/2019   HDL 30.70 (L) 08/20/2019   LDLDIRECT 77.0 08/20/2019   LDLCALC 32 07/17/2018   ALT 33 08/20/2019   AST 23 08/20/2019   NA 140 08/20/2019   K 4.0 08/20/2019   CL 104 08/20/2019   CREATININE 1.43 08/20/2019   BUN 21 08/20/2019   CO2 26 08/20/2019   TSH 2.73 08/20/2019   PSA 3.14 08/20/2019   INR 2.7 04/23/2019   HGBA1C 9.1 (H) 08/20/2019   MICROALBUR <0.7 05/14/2019    Lab Results  Component Value Date   TSH 2.73 08/20/2019   Lab Results  Component  Value Date   WBC 8.8 08/20/2019   HGB 14.3 08/20/2019   HCT 41.5 08/20/2019   MCV 91.3 08/20/2019   PLT 182.0 08/20/2019   Lab Results  Component Value Date   NA 140 08/20/2019   K 4.0 08/20/2019   CO2 26 08/20/2019   GLUCOSE 185 (H) 08/20/2019   BUN 21 08/20/2019   CREATININE 1.43 08/20/2019   BILITOT 0.7 08/20/2019   ALKPHOS 97 08/20/2019   AST 23 08/20/2019   ALT 33 08/20/2019   PROT 7.0 08/20/2019   ALBUMIN 4.5 08/20/2019   CALCIUM 10.1 08/20/2019   ANIONGAP 11 11/07/2018   GFR 48.76 (L) 08/20/2019   Lab Results  Component Value Date   CHOL 125 08/20/2019   Lab Results  Component Value Date   HDL 30.70 (L) 08/20/2019   Lab Results  Component Value Date   LDLCALC 32 07/17/2018   Lab Results  Component Value Date   TRIG 248.0 (H) 08/20/2019   Lab Results  Component Value Date   CHOLHDL 4 08/20/2019   Lab Results  Component Value Date   HGBA1C 9.1 (H) 08/20/2019       Assessment & Plan:   Problem List Items Addressed This Visit    Diabetes mellitus type II, uncontrolled ([place]) (Chronic)    He noted a concern that his sugar is highest in am sometimes at 140 or 150 but he did have one episode of low sugar at 47 a couple of weeks ago. No change in Novolin dose for now. He has appt with endocrinology in early December.       SCIATICA    Is having occasional electrical shooting pains down side of right leg. Occurs roughly daily and is fleeting. He is encouraged to stay active and can try lidocaine patches. Report if worsens.       Essential hypertension    Monitor and report any concerns, no changes to meds. Encouraged heart healthy diet such as the DASH diet and exercise as tolerated.       Headache    Have been some better but does have an appointment with neurology early next month.       CKD (chronic kidney disease), stage III    Hydrate and monitor. Minimize OTC meds without clearing with [place].          I am having Jazper A. Medel maintain  his loperamide, diclofenac sodium, divalproex, nitroGLYCERIN, Vitamin D (Ergocalciferol), sertraline, glucose  blood, levothyroxine, tiZANidine, atorvastatin, metoprolol succinate, furosemide, gabapentin, apixaban, ferrous sulfate, albuterol, midodrine, fenofibrate, isosorbide mononitrate, Insulin Pen Needle, INSULIN SYRINGE 1CC/29G, FreeStyle Libre 14 Day Sensor, insulin NPH Human, NovoLOG FlexPen, ranolazine, pantoprazole, and pramipexole.  No orders of the defined types were placed in this encounter.    Penni Homans, MD    I discussed the assessment and treatment plan with the patient. The patient was provided an opportunity to ask questions and all were answered. The patient agreed with the plan and demonstrated an understanding of the instructions.   The patient was advised to call back or seek an in-person evaluation if the symptoms worsen or if the condition fails to improve as anticipated.  I provided 25 minutes of non-face-to-face time during this encounter.   Penni Homans, MD

## 2019-09-25 NOTE — Progress Notes (Signed)
Right leg pain, burning sensation does not bother him that often comes and goes   Novolin is not working at night

## 2019-10-02 MED FILL — FREESTYLE LIBRE 14 DAY SENS: 28 days supply | Qty: 2 | Fill #1
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
## 2019-10-09 ENCOUNTER — Other Ambulatory Visit: Payer: Self-pay | Admitting: Family Medicine

## 2019-10-09 MED FILL — ELIQUIS 5 MG TABLET: 5 | 30 days supply | Qty: 60 | Fill #5

## 2019-10-09 MED FILL — MIDODRINE HCL 10 MG TABS: 10 | 90 days supply | Qty: 180 | Fill #1

## 2019-10-10 ENCOUNTER — Other Ambulatory Visit: Payer: Self-pay

## 2019-10-10 ENCOUNTER — Encounter: Payer: Self-pay | Admitting: Neurology

## 2019-10-10 ENCOUNTER — Ambulatory Visit: Payer: Medicare PPO | Admitting: Neurology

## 2019-10-10 ENCOUNTER — Telehealth: Payer: Self-pay | Admitting: Neurology

## 2019-10-10 VITALS — BP 145/66 | HR 54 | Temp 97.4°F | Ht 68.0 in | Wt 215.6 lb

## 2019-10-10 DIAGNOSIS — R519 Headache, unspecified: Secondary | ICD-10-CM | POA: Diagnosis not present

## 2019-10-10 DIAGNOSIS — M5441 Lumbago with sciatica, right side: Secondary | ICD-10-CM | POA: Diagnosis not present

## 2019-10-10 MED ORDER — GABAPENTIN 100 MG PO CAPS: ORAL_CAPSULE | ORAL | 3 refills | Status: DC

## 2019-10-10 MED FILL — GABAPENTIN 100 MG CAPSULE: 100 | 40 days supply | Qty: 120 | Fill #0

## 2019-10-10 MED FILL — RANOLAZINE ER 500 MG TABLET: 500 | 30 days supply | Qty: 60 | Fill #0

## 2019-10-10 NOTE — Telephone Encounter (Signed)
Craig Staggers [id number] (exp 10/10/19-11/09/19) Order sent to GI.They will reach out to pt to schedule.

## 2019-10-10 NOTE — Patient Instructions (Signed)
You may increase gabapentin 100 mg in the morning, 200 mg in the evening. I will order MRI of the lumbar spine. Please discuss with your wife about doing neuromuscular therapy, dry needling for your neck pain. Return in 4-6 months with Dr. Jannifer Franklin.

## 2019-10-13 NOTE — Progress Notes (Signed)
I have read the note, and I agree with the clinical assessment and plan.  Turon Kilmer K Demaris Leavell   

## 2019-10-23 ENCOUNTER — Other Ambulatory Visit: Payer: Self-pay | Admitting: Family Medicine

## 2019-10-23 MED FILL — SERTRALINE HCL 100 MG TABS: 100 | 90 days supply | Qty: 180 | Fill #0

## 2019-10-23 MED FILL — NovoLIN N 100 UNIT/ML SUSP: 100 | 38 days supply | Qty: 30 | Fill #1

## 2019-10-23 MED FILL — NOVOLOG FLEXPEN SYRINGE: 100 | 11 days supply | Qty: 30 | Fill #1

## 2019-10-26 MED FILL — tiZANidine HCL 4 MG TABS: 4 | 10 days supply | Qty: 40 | Fill #1

## 2019-10-31 MED FILL — ACCU-CHEK AVIVA PLUS TEST S: 50 days supply | Qty: 200 | Fill #1

## 2019-10-31 MED FILL — FUROSEMIDE 20 MG TAB: 20 | 30 days supply | Qty: 30 | Fill #3

## 2019-10-31 MED FILL — FENOFIBRATE 145 MG TAB: 145 | 30 days supply | Qty: 30 | Fill #2

## 2019-11-04 ENCOUNTER — Other Ambulatory Visit: Payer: Self-pay

## 2019-11-04 ENCOUNTER — Ambulatory Visit
Admission: RE | Admit: 2019-11-04 | Discharge: 2019-11-04 | Disposition: A | Discharge status: Home / Self Care | Payer: Medicare PPO | Source: Ambulatory Visit | Attending: Neurology | Admitting: Neurology

## 2019-11-04 DIAGNOSIS — M5441 Lumbago with sciatica, right side: Secondary | ICD-10-CM

## 2019-11-04 DIAGNOSIS — M48061 Spinal stenosis, lumbar region without neurogenic claudication: Secondary | ICD-10-CM | POA: Diagnosis not present

## 2019-11-06 ENCOUNTER — Telehealth: Payer: Self-pay

## 2019-11-06 ENCOUNTER — Other Ambulatory Visit: Payer: Self-pay

## 2019-11-06 NOTE — Telephone Encounter (Signed)
Pt returned call, went over results. Pt demonstrated understanding. Please see previous note.

## 2019-11-06 NOTE — Telephone Encounter (Signed)
Called pt to go over recent lab result impression on behalf of NP. Butler Denmark.  No answer Left voice mail.   "Please call the patient. MRI of the lumbar spine, does not show any acute process or injury. He has mild lumbar spine degeneration for his age, overall unchanged from prior MRI. Have him continue activity, stretching, exercises as tolerated.   IMPRESSION:  1. Stable MRI appearance of the lumbar spine since 2019 aside from  mild degenerative appearing marrow edema in the left L5 posterior  elements.  2. Continued mild for age lumbar spine degeneration. Borderline to  mild lower lumbar spinal and bilateral foraminal stenosis is  unchanged from the prior MRI. "-Per NP Butler Denmark.

## 2019-11-07 ENCOUNTER — Encounter: Payer: Self-pay | Admitting: Endocrinology

## 2019-11-07 ENCOUNTER — Ambulatory Visit (INDEPENDENT_AMBULATORY_CARE_PROVIDER_SITE_OTHER): Payer: Medicare PPO | Admitting: Endocrinology

## 2019-11-07 VITALS — BP 122/60 | HR 64 | Ht 68.0 in | Wt 214.0 lb

## 2019-11-07 DIAGNOSIS — E1165 Type 2 diabetes mellitus with hyperglycemia: Secondary | ICD-10-CM | POA: Diagnosis not present

## 2019-11-07 LAB — POCT GLYCOSYLATED HEMOGLOBIN (HGB A1C): Hemoglobin A1C: 7.4 % — AB (ref 4.0–5.6)

## 2019-11-07 MED ORDER — NOVOLOG FLEXPEN 100 UNIT/ML ~~LOC~~ SOPN: PEN_INJECTOR | SUBCUTANEOUS | 11 refills | Status: DC

## 2019-11-07 MED FILL — FREESTYLE LIBRE 14 DAY SENS: 28 days supply | Qty: 2 | Fill #2

## 2019-11-07 NOTE — Patient Instructions (Addendum)
Please change the novolog to 3 times a day (just before each meal) 65-80-130 units check your blood sugar 4 times a day: before the 3 meals, and at bedtime.  also check if you have symptoms of your blood sugar being too high or too low.  please keep a record of the readings and bring it to your next appointment here (or you can bring the meter itself).  You can write it on any piece of paper.  please call us sooner if your blood sugar goes below 70, or if you have a lot of readings over 200.  Please come back for a follow-up appointment in 2-3 months.

## 2019-11-07 NOTE — Progress Notes (Signed)
Subjective:    Patient ID: [name] [name], male    DOB: May 01, 1946, 73 y.o.   MRN: [id number]  HPI Pt returns for f/u of diabetes mellitus:  DM type: Insulin-requiring type 2.  Dx'ed: 4665 Complications: polyneuropathy, renal insuff, CAD and PAD.  Therapy: insulin since 2008 DKA: never Severe hypoglycemia: never Pancreatitis: never Pancreatic imaging: 2017 CT showed fatty replacement of the pancreatic head and body.   Other: he takes multiple daily injections; he takes human insulin at HS, due to cost.   Interval history: no cbg record, but states cbg's vary from 75-290.  It is in general lowest at lunch, and highest at HS.  He says he never misses the insulin.  pt states he feels well in general.   Past Medical History:  Diagnosis Date  . Acquired atrophy of thyroid 06/05/2013   Overview:  July 2014: controlled on synthroid 72mcg since 2012 May 2015: decreased to Synthroid 50 mcg  Last Assessment & Plan:  Pt doing well with most recent TSH within normal range.  Will continue current dosage of Synthroid 27mcg. Pt reminded to take medication on empty stomach. Will continue to monitor with periodic laboratory assessment in 3 months.   . Anemia    hemoglobin 7.4, iron deficiency, January, 2011, 2 unit transfusion, endoscopy normal, capsule endoscopy February, 2011 no small bowel abnormalities.   Most likely source gastric erosions, followed by GI  . Anxiety   . Bradycardia   . CAD (coronary artery disease)    A. CABG in 2000,status post cardiac cath in 2006, 2009 ....continued chest pain and SOB despite oral medication adjestments including Ranexa. B. Cath November 2009/ mRCA - 2.75 x 23 Abbott Xience V drug-eluting stent ...11/26/2008 to distal  RCA leading to acute marginal.  C. Cath 07/2012 for CP - stable anatomy, med rx. d. cath 2015 and 05/30/2015 stable anatomy, consider Myoview if has CP again  . Carotid artery disease ([place]) 08/01/2015   Doppler, May 29, 2015, 1-39% bilateral ICA    . Cerebral ischemia    MRI November, 2010, chronic microvascular ischemia  . CKD (chronic kidney disease), stage III   . Concussion   . Depression    Bipolar  . Diabetes mellitus ([place])   . Edema   . Essential hypertension 06/05/2013   Overview:  July 2014: Controlled with Bystolic Sep 9935: Changed to atenolol.  Last Assessment & Plan:  Will change to atenolol for cost and follow Improved.  Medication compliance strongly encouraged BP: 116/64 mmHg    Overview:  July 2014: Controlled with Bystolic Sep 7017: Changed to atenolol.  Last Assessment & Plan:  Will change to atenolol for cost and follow  . Falling episodes    these have occurred in the past and again recurring 2011  . Family history of adverse reaction to anesthesia    "mother died during bypass surgery but not sure if it has to do with anesthesia"  . Gastric ulcer   . GERD (gastroesophageal reflux disease)   . H/O medication noncompliance    Due to loss of insurance  . H/O multiple concussions   . Hard of hearing   . History of blood transfusion 12/20/2013  . Hx of CABG    2000,  / one median sternotomy suture broken her chest x-ray November, 2010, no clinical significance  . Hyperlipidemia   . Hypertension    pt. denies  . Iron deficiency anemia   . Long term (current) use of anticoagulants [Z79.01] 07/20/2016  .  Low back pain 06/12/2009   Qualifier: Diagnosis of  By: [name]   . Nephrolithiasis   . Orthostasis   . OSA (obstructive sleep apnea)   . PAF (paroxysmal atrial fibrillation) ([place])    a. dx 2017, started on amiodarone/warfarin but patient intermittently noncompliant.  Marland Kitchen PSVT (paroxysmal supraventricular tachycardia) ([place])   . RBBB 07/09/2009   Qualifier: Diagnosis of  By: [name], MD, [name] [name]   . RLS (restless legs syndrome) 09/19/2009   Qualifier: Diagnosis of  By: [name]   Overview:  July 2014: Controlled with Mirapex  Last Assessment & Plan:  Patient is doing well. Will continue  current management and follow clinically.  . Seizure disorder ([place]) 01/09/2017  . Spondylosis    C5-6, C6-7 MRI 2010  . Syncope 03/2016  . Thyroid disease   . Tubulovillous adenoma of colon 2007  . Vitamin D deficiency 05/17/2017  . Wears glasses     Past Surgical History:  Procedure Laterality Date  . ANTERIOR CERVICAL DECOMP/DISCECTOMY FUSION N/A 05/31/2016   Procedure: ANTERIOR CERVICAL DECOMPRESSION/DISCECTOMY FUSION CERVICAL FIVE-SIX,CERVICAL SIX-SEVEN;  Surgeon: [name], MD;  Location: [facility];  Service: Neurosurgery;  Laterality: N/A;  . CARDIAC CATHETERIZATION N/A 05/30/2015   Procedure: Left Heart Cath and Coronary Angiography;  Surgeon: [name], MD;  Location: [facility];  Service: Cardiovascular;  Laterality: N/A;  . COLONOSCOPY    . CORONARY ARTERY BYPASS GRAFT     2000  . ESOPHAGOGASTRODUODENOSCOPY    . LEFT HEART CATH AND CORS/GRAFTS ANGIOGRAPHY N/A 12/15/2017   Procedure: LEFT HEART CATH AND CORS/GRAFTS ANGIOGRAPHY;  Surgeon: [name], MD;  Location: [facility];  Service: Cardiovascular;  Laterality: N/A;  . LEFT HEART CATHETERIZATION WITH CORONARY/GRAFT ANGIOGRAM N/A 08/01/2012   Procedure: LEFT HEART CATHETERIZATION WITH Beatrix Fetters;  Surgeon: [name], MD;  Location: [facility] C[facility];  Service: Cardiovascular;  Laterality: N/A;  . LEFT HEART CATHETERIZATION WITH CORONARY/GRAFT ANGIOGRAM N/A 01/03/2015   Procedure: LEFT HEART CATHETERIZATION WITH Beatrix Fetters;  Surgeon: [name], MD;  Location[facility];  Service: Cardiovascular;  Laterality: N/A;  . NASAL SEPTUM SURGERY     UP3  . PERCUTANEOUS CORONARY STENT INTERVENTION (PCI-S)  10/2008   mRCA PCI  2.75 x 23 Abbott Xience V drug-eluting stent   . ULTRASOUND GUIDANCE FOR VASCULAR ACCESS  12/15/2017   Procedure: Ultrasound Guidance For Vascular Access;  Surgeon: [name], MD;  Location: [facility];  Service: Cardiovascular;;     Social History   Socioeconomic History  . Marital status: Married    Spouse name: Not on file  . Number of children: 4  . Years of education: 74  . Highest education level: Not on file  Occupational History  . Occupation: retired  Scientific laboratory technician  . Financial resource strain: Not on file  . Food insecurity    Worry: Not on file    Inability: Not on file  . Transportation needs    Medical: Not on file    Non-medical: Not on file  Tobacco Use  . Smoking status: Never Smoker  . Smokeless tobacco: Never Used  Substance and Sexual Activity  . Alcohol use: No    Alcohol/week: 0.0 standard drinks    Comment: stopped drinking in 1998  . Drug use: No  . Sexual activity: Not on file  Lifestyle  . Physical activity    Days per week: Not on file  Minutes per session: Not on file  . Stress: Not on file  Relationships  . Social Herbalist on phone: Not on file    Gets together: Not on file    Attends religious service: Not on file    Active member of club or organization: Not on file    Attends meetings of clubs or organizations: Not on file    Relationship status: Not on file  . Intimate partner violence    Fear of current or ex partner: Not on file    Emotionally abused: Not on file    Physically abused: Not on file    Forced sexual activity: Not on file  Other Topics Concern  . Not on file  Social History Narrative   Patient is right handed.   Patient does not drink caffeine.    Current Outpatient Medications on File Prior to Visit  Medication Sig Dispense Refill  . albuterol (VENTOLIN HFA) 108 (90 Base) MCG/ACT inhaler Inhale 2 puffs into the lungs every 6 (six) hours as needed for wheezing or shortness of breath. 18 g 2  . apixaban (ELIQUIS) 5 MG TABS tablet Take 1 tablet (5 mg total) by mouth 2 (two) times daily. 60 tablet 5  . atorvastatin (LIPITOR) 80 MG tablet Take 1 tablet (80 mg total) by mouth daily. 90 tablet 3  . Continuous Blood Gluc Sensor  (FREESTYLE LIBRE 14 DAY SENSOR) MISC 1 Device by Does not apply route every 14 (fourteen) days. 6 each 3  . diclofenac sodium (VOLTAREN) 1 % GEL Apply 2 g topically daily as needed (for pain in affected area). 2 Tube 0  . divalproex (DEPAKOTE ER) 250 MG 24 hr tablet Take 3 tablets (750 mg total) by mouth at bedtime. 180 tablet 0  . fenofibrate (TRICOR) 145 MG tablet Take 1 tablet (145 mg total) by mouth daily. 30 tablet 3  . ferrous sulfate 325 (65 FE) MG tablet Take 1 tablet (325 mg total) by mouth every other day.  3  . furosemide (LASIX) 20 MG tablet Take 1 tablet (20 mg total) by mouth daily. 30 tablet 0  . gabapentin (NEURONTIN) 100 MG capsule Take 1 in the morning, take 2 in the evening 120 capsule 3  . glucose blood (ACCU-CHEK AVIVA) test strip Use as directed to check blood sugar 4 times daily.  DX E11.9 200 each 6  . insulin NPH Human (NOVOLIN N) 100 UNIT/ML injection Inject 0.8 mLs (80 Units total) into the skin at bedtime. 30 mL 11  . Insulin Pen Needle 31G X 8 MM MISC 1 each by Does not apply route 2 (two) times daily. Use to inject insulin BID 60 each 0  . INSULIN SYRINGE 1CC/29G 29G X 1/2" 1 ML MISC 1 each by Does not apply route daily. Use to inject insulin daily 30 each 0  . isosorbide mononitrate (IMDUR) 60 MG 24 hr tablet TAKE TWO TABLETS BY MOUTH DAILY 180 tablet 0  . levothyroxine (SYNTHROID, LEVOTHROID) 100 MCG tablet Take 1 tablet (100 mcg total) by mouth daily. 90 tablet 1  . loperamide (IMODIUM A-D) 2 MG tablet Take 1 tablet (2 mg total) by mouth 4 (four) times daily as needed for diarrhea or loose stools. 30 tablet 0  . metoprolol succinate (TOPROL-XL) 50 MG 24 hr tablet TAKE 1&1/2 TABLETS BY MOUTH ONCE A DAY FOR A TOTAL OF 75 MG. 135 tablet 3  . midodrine (PROAMATINE) 10 MG tablet Take 2 tablets (20 mg total) by  mouth daily. 180 tablet 2  . nitroGLYCERIN (NITROSTAT) 0.4 MG SL tablet Place 1 tablet (0.4 mg total) under the tongue every 5 (five) minutes as needed for chest  pain. 25 tablet 1  . pantoprazole (PROTONIX) 40 MG tablet TAKE 1 TABLET (40 MG TOTAL) BY MOUTH DAILY. 90 tablet 0  . pramipexole (MIRAPEX) 1 MG tablet TAKE 1/2 TO 1 TABLET AT BEDTIME 90 tablet 0  . ranolazine (RANEXA) 500 MG 12 hr tablet TAKE 1 TABLET (500 MG TOTAL) BY MOUTH 2 (TWO) TIMES DAILY. 60 tablet 0  . sertraline (ZOLOFT) 100 MG tablet TAKE 1 TABLET (100 MG TOTAL) BY MOUTH 2 (TWO) TIMES DAILY. 180 tablet 1  . tiZANidine (ZANAFLEX) 4 MG tablet Take 1 tablet (4 mg total) by mouth every 6 (six) hours as needed for muscle spasms. 40 tablet 2  . Vitamin D, Ergocalciferol, (DRISDOL) 1.25 MG (50000 UT) CAPS capsule Take 1 capsule (50,000 Units total) by mouth every 7 (seven) days. 20 capsule 0   No current facility-administered medications on file prior to visit.     Allergies  Allergen Reactions  . Morphine Other (See Comments)    hallucinations  . Shellfish-Derived Products Itching    Family History  Problem Relation Age of Onset  . Pancreatic cancer Brother   . Diabetes Brother   . Coronary artery disease Brother   . Stroke Brother   . Diabetes Brother   . Diabetes Mother   . Heart failure Mother   . Heart failure Father   . Hypothyroidism Brother   . Coronary artery disease Brother   . Other Brother        colon surgery  . Heart attack Other        Nephew  . Irregular heart beat Daughter   . Cancer Maternal Grandmother        unknown   . Colon cancer Neg Hx   . Stomach cancer Neg Hx   . Liver cancer Neg Hx   . Rectal cancer Neg Hx   . Esophageal cancer Neg Hx     BP 122/60 (BP Location: Left Arm, Patient Position: Sitting, Cuff Size: Large)   Pulse 64   Ht 5\' 8"  (1.727 m)   Wt 214 lb (97.1 kg)   SpO2 97%   BMI 32.54 kg/m    Review of Systems He denies hypoglycemia.      Objective:   Physical Exam VITAL SIGNS:  See vs page GENERAL: no distress Pulses: dorsalis pedis intact bilat.   MSK: no deformity of the feet.  CV: 1+ bilat leg edema.   Skin:  no  ulcer on the feet, but the skin is dry.  normal color and temp on the feet.   Neuro: sensation is intact to touch on the feet.     Lab Results  Component Value Date   HGBA1C 7.4 (A) 11/07/2019       Assessment & Plan:  Insulin-requiring type 2 DM, with PAD: The pattern of his cbg's indicates he needs some adjustment in his therapy.  Renal insuff: in this setting, he needs mostly mealtime insulin.    Patient Instructions  Please change the novolog to 3 times a day (just before each meal) 65-80-130 units check your blood sugar 4 times a day: before the 3 meals, and at bedtime.  also check if you have symptoms of your blood sugar being too high or too low.  please keep a record of the readings and bring it to your  next appointment here (or you can bring the meter itself).  You can write it on any piece of paper.  please call us sooner if your blood sugar goes below 70, or if you have a lot of readings over 200.  Please come back for a follow-up appointment in 2-3 months.

## 2019-11-12 ENCOUNTER — Telehealth: Payer: Self-pay

## 2019-11-12 ENCOUNTER — Other Ambulatory Visit: Payer: Self-pay | Admitting: Family Medicine

## 2019-11-12 ENCOUNTER — Other Ambulatory Visit: Payer: Self-pay

## 2019-11-12 DIAGNOSIS — E1165 Type 2 diabetes mellitus with hyperglycemia: Secondary | ICD-10-CM

## 2019-11-12 MED ORDER — INSULIN PEN NEEDLE 31G X 8 MM MISC: 1.0000 | Freq: Three times a day (TID) | 0 refills | Status: DC

## 2019-11-12 MED ORDER — NOVOLOG FLEXPEN 100 UNIT/ML ~~LOC~~ SOPN: PEN_INJECTOR | SUBCUTANEOUS | 11 refills | Status: DC

## 2019-11-12 MED FILL — NOVOLOG FLEXPEN SYRINGE: 100 | 11 days supply | Qty: 30 | Fill #2

## 2019-11-12 NOTE — Telephone Encounter (Signed)
pharmacy called and need a updated Rx sent in for  insulin aspart (NOVOLOG FLEXPEN) 100 UNIT/ML FlexPen

## 2019-11-12 NOTE — Telephone Encounter (Signed)
insulin aspart (NOVOLOG FLEXPEN) 100 UNIT/ML FlexPen 30 pen 11 11/12/2019    Sig: 3 times a day (just before each meal) 65-80-130 units   Sent to pharmacy as: insulin aspart (NOVOLOG FLEXPEN) 100 UNIT/ML FlexPen   E-Prescribing Status: Receipt confirmed by pharmacy (11/12/2019 12:46 PM EST)

## 2019-11-13 MED FILL — RANOLAZINE ER 500 MG TABLET: 500 | 30 days supply | Qty: 60 | Fill #0

## 2019-11-19 ENCOUNTER — Other Ambulatory Visit: Payer: Self-pay | Admitting: Interventional Cardiology

## 2019-11-19 MED FILL — ELIQUIS 5 MG TABLET: 5 | 30 days supply | Qty: 60 | Fill #0

## 2019-11-19 NOTE — Telephone Encounter (Signed)
Eliquis 5mg  refill request received, pt is 73 yrs old, weight-97.1kg, Crea-1.43 on 08/20/2019, Diagnosis-Afib, and last seen by [name] on  04/02/2019. Dose is appropriate based on dosing criteria. Will send in refill to requested pharmacy.

## 2019-11-26 ENCOUNTER — Encounter: Payer: Medicare PPO | Admitting: Nutrition

## 2019-11-28 ENCOUNTER — Other Ambulatory Visit: Payer: Self-pay | Admitting: Family Medicine

## 2019-11-28 MED FILL — ATORVASTATIN 80 MG TABLET: 80 | 90 days supply | Qty: 90 | Fill #3

## 2019-11-28 MED FILL — ISOSORBIDE MN ER 60 MG TAB: 60 | 90 days supply | Qty: 180 | Fill #0

## 2019-11-28 MED FILL — FENOFIBRATE 145 MG TAB: 145 | 30 days supply | Qty: 30 | Fill #3

## 2019-11-28 MED FILL — FUROSEMIDE 20 MG TAB: 20 | 30 days supply | Qty: 30 | Fill #0

## 2019-12-06 MED FILL — FREESTYLE LIBRE 14 DAY SENS: 28 days supply | Qty: 2 | Fill #3

## 2019-12-06 MED FILL — tiZANidine HCL 4 MG TABS: 4 | 10 days supply | Qty: 40 | Fill #2

## 2019-12-06 MED FILL — NovoLIN N 100 UNIT/ML SUSP: 100 | 38 days supply | Qty: 30 | Fill #2

## 2019-12-06 MED FILL — NOVOLOG FLEXPEN SYRINGE: 100 | 11 days supply | Qty: 30 | Fill #3

## 2019-12-10 ENCOUNTER — Telehealth: Payer: Self-pay

## 2019-12-10 NOTE — Telephone Encounter (Signed)
PRIOR AUTHORIZATION  PA initiation date: 12/10/19   Insurance Company: [employer] Submission completed electronically through [employer] My Meds: Yes  APPROVAL  Received notification from [place] that PA for Novolog Flexpen DOES NOT require PA, further adding available WITHOUT authorization.  [name] [name] (Key: [id number]) NovoLOG FlexPen 100UNIT/ML pen-injectors   Form [employer] Electronic PA Form Created 3 days ago Sent to Plan less than a minute ago Plan Response less than a minute ago Submit Clinical Questions Determination N/A Message from Plan Available without authorization.

## 2019-12-11 ENCOUNTER — Other Ambulatory Visit: Payer: Self-pay | Admitting: Family Medicine

## 2019-12-13 MED FILL — NITROGLYCERIN 0.4 MG TAB SL: 0.4 | 7 days supply | Qty: 25 | Fill #0

## 2019-12-14 ENCOUNTER — Other Ambulatory Visit: Payer: Self-pay | Admitting: Family Medicine

## 2019-12-14 MED FILL — LEVOTHYROXINE 100 MCG TAB: 100 | 90 days supply | Qty: 90 | Fill #0

## 2019-12-17 ENCOUNTER — Other Ambulatory Visit: Payer: Self-pay | Admitting: Family Medicine

## 2019-12-17 MED FILL — ELIQUIS 5 MG TABLET: 5 | 30 days supply | Qty: 60 | Fill #1

## 2019-12-17 MED FILL — RANOLAZINE ER 500 MG TABLET: 500 | 30 days supply | Qty: 60 | Fill #0

## 2019-12-26 MED FILL — NOVOLOG FLEXPEN SYRINGE: 100 | 15 days supply | Qty: 30 | Fill #4

## 2019-12-27 ENCOUNTER — Ambulatory Visit (INDEPENDENT_AMBULATORY_CARE_PROVIDER_SITE_OTHER): Payer: Medicare PPO | Admitting: Family Medicine

## 2019-12-27 ENCOUNTER — Telehealth: Payer: Self-pay | Admitting: Interventional Cardiology

## 2019-12-27 ENCOUNTER — Other Ambulatory Visit: Payer: Self-pay

## 2019-12-27 ENCOUNTER — Encounter: Payer: Self-pay | Admitting: Family Medicine

## 2019-12-27 VITALS — BP 122/82 | HR 68 | Temp 98.2°F | Resp 18 | Wt 218.8 lb

## 2019-12-27 DIAGNOSIS — D649 Anemia, unspecified: Secondary | ICD-10-CM

## 2019-12-27 DIAGNOSIS — I10 Essential (primary) hypertension: Secondary | ICD-10-CM

## 2019-12-27 DIAGNOSIS — E034 Atrophy of thyroid (acquired): Secondary | ICD-10-CM

## 2019-12-27 DIAGNOSIS — I4821 Permanent atrial fibrillation: Secondary | ICD-10-CM

## 2019-12-27 DIAGNOSIS — G2581 Restless legs syndrome: Secondary | ICD-10-CM | POA: Diagnosis not present

## 2019-12-27 DIAGNOSIS — R519 Headache, unspecified: Secondary | ICD-10-CM | POA: Diagnosis not present

## 2019-12-27 DIAGNOSIS — E785 Hyperlipidemia, unspecified: Secondary | ICD-10-CM | POA: Diagnosis not present

## 2019-12-27 DIAGNOSIS — M25572 Pain in left ankle and joints of left foot: Secondary | ICD-10-CM | POA: Diagnosis not present

## 2019-12-27 DIAGNOSIS — E1165 Type 2 diabetes mellitus with hyperglycemia: Secondary | ICD-10-CM

## 2019-12-27 LAB — COMPREHENSIVE METABOLIC PANEL
ALT: 28 U/L (ref 0–53)
AST: 27 U/L (ref 0–37)
Albumin: 4.5 g/dL (ref 3.5–5.2)
Alkaline Phosphatase: 57 U/L (ref 39–117)
BUN: 28 mg/dL — ABNORMAL HIGH (ref 6–23)
CO2: 26 mEq/L (ref 19–32)
Calcium: 9.7 mg/dL (ref 8.4–10.5)
Chloride: 104 mEq/L (ref 96–112)
Creatinine, Ser: 1.94 mg/dL — ABNORMAL HIGH (ref 0.40–1.50)
GFR: 34.26 mL/min — ABNORMAL LOW (ref >60.00)
Glucose, Bld: 206 mg/dL — ABNORMAL HIGH (ref 70–99)
Potassium: 4.7 mEq/L (ref 3.5–5.1)
Sodium: 137 mEq/L (ref 135–145)
Total Bilirubin: 0.6 mg/dL (ref 0.2–1.2)
Total Protein: 7 g/dL (ref 6.0–8.3)

## 2019-12-27 LAB — CBC
HCT: 39.9 % (ref 39.0–52.0)
Hemoglobin: 13.4 g/dL (ref 13.0–17.0)
MCHC: 33.5 g/dL (ref 30.0–36.0)
MCV: 92.6 fl (ref 78.0–100.0)
Platelets: 213 10*3/uL (ref 150.0–400.0)
RBC: 4.31 Mil/uL (ref 4.22–5.81)
RDW: 14.4 % (ref 11.5–15.5)
WBC: 7.6 10*3/uL (ref 4.0–10.5)

## 2019-12-27 LAB — LIPID PANEL
Cholesterol: 123 mg/dL (ref 0–200)
HDL: 34.5 mg/dL — ABNORMAL LOW (ref >39.00)
LDL Cholesterol: 65 mg/dL (ref 0–99)
NonHDL: 88.71
Total CHOL/HDL Ratio: 4
Triglycerides: 117 mg/dL (ref 0.0–149.0)
VLDL: 23.4 mg/dL (ref 0.0–40.0)

## 2019-12-27 LAB — TSH: TSH: 4.38 u[IU]/mL (ref 0.35–4.50)

## 2019-12-27 MED ORDER — ROPINIROLE HCL 1 MG PO TABS: 1.0000 mg | ORAL_TABLET | Freq: Every day | ORAL | 1 refills | Status: DC

## 2019-12-27 MED FILL — rOPINIRole HCL 1 MG TABS: 1 | 30 days supply | Qty: 30 | Fill #0

## 2019-12-27 NOTE — Patient Instructions (Signed)
[email or web address] Restless Legs Syndrome Restless legs syndrome is a condition that causes uncomfortable feelings or sensations in the legs, especially while sitting or lying down. The sensations usually cause an overwhelming urge to move the legs. The arms can also sometimes be affected. The condition can range from mild to severe. The symptoms often interfere with a person's ability to sleep. What are the causes? The cause of this condition is not known. What increases the risk? The following factors may make you more likely to develop this condition:  Being older than 50.  Pregnancy.  Being a woman. In general, the condition is more common in women than in men.  A family history of the condition.  Having iron deficiency.  Overuse of caffeine, nicotine, or alcohol.  Certain medical conditions, such as kidney disease, Parkinson's disease, or nerve damage.  Certain medicines, such as those for high blood pressure, nausea, colds, allergies, depression, and some heart conditions. What are the signs or symptoms? The main symptom of this condition is uncomfortable sensations in the legs, such as:  Pulling.  Tingling.  Prickling.  Throbbing.  Crawling.  Burning. Usually, the sensations:  Affect both sides of the body.  Are worse when you sit or lie down.  Are worse at night. These may wake you up or make it difficult to fall asleep.  Make you have a strong urge to move your legs.  Are temporarily relieved by moving your legs. The arms can also be affected, but this is rare. People who have this condition often have tiredness during the day because of their lack of sleep at night. How is this diagnosed? This condition may be diagnosed based on:  Your symptoms.  Blood tests. In some cases, you may be monitored in a sleep lab by a specialist (a sleep study). This can detect any disruptions in your sleep. How is this treated? This condition is treated by  managing the symptoms. This may include:  Lifestyle changes, such as exercising, using relaxation techniques, and avoiding caffeine, alcohol, or tobacco.  Medicines. Anti-seizure medicines may be tried first. Follow these instructions at home:     General instructions  Take over-the-counter and prescription medicines only as told by your health care provider.  Use methods to help relieve the uncomfortable sensations, such as: ? Massaging your legs. ? Walking or stretching. ? Taking a cold or hot bath.  Keep all follow-up visits as told by your health care provider. This is important. Lifestyle  Practice good sleep habits. For example, go to bed and get up at the same time every day. Most adults should get 7-9 hours of sleep each night.  Exercise regularly. Try to get at least 30 minutes of exercise most days of the week.  Practice ways of relaxing, such as yoga or meditation.  Avoid caffeine and alcohol.  Do not use any products that contain nicotine or tobacco, such as cigarettes and e-cigarettes. If you need help quitting, ask your health care provider. Contact a health care provider if:  Your symptoms get worse or they do not improve with treatment. Summary  Restless legs syndrome is a condition that causes uncomfortable feelings or sensations in the legs, especially while sitting or lying down.  The symptoms often interfere with a person's ability to sleep.  This condition is treated by managing the symptoms. You may need to make lifestyle changes or take medicines. This information is not intended to replace advice given to you by your health care provider. Make  sure you discuss any questions you have with your health care provider. Document Revised: 12/12/2017 Document Reviewed: 12/12/2017 Elsevier Patient Education  [place].

## 2019-12-27 NOTE — Telephone Encounter (Signed)
Patient states he received a call from Dr. Caryl Comes. Patient has not seen Dr. Caryl Comes since 2017. He states that it always pops on his phone as Dr. Caryl Comes when he gets a call. He see's [name] but I don't see a note about who would have called him. I advised him someone would reach out if needed.

## 2019-12-27 NOTE — Telephone Encounter (Signed)
Left detailed message that patient is overdue for 6 mo f/u and needs to call back to schedule with [name] or APP on his team.

## 2019-12-30 DIAGNOSIS — M25572 Pain in left ankle and joints of left foot: Secondary | ICD-10-CM | POA: Insufficient documentation

## 2019-12-30 NOTE — Assessment & Plan Note (Signed)
Likely multifactorial and includes post concussive, tension, dehydration and more but has been resistent to treatment so he has an appt with neurology next month which he agrees to keep

## 2019-12-30 NOTE — Assessment & Plan Note (Signed)
Encouraged heart healthy diet, increase exercise, avoid trans fats, consider a krill oil cap daily. Continue current meds

## 2019-12-30 NOTE — Progress Notes (Signed)
Subjective:    Patient ID: [name] [name], male    DOB: 08-08-1946, 73 y.o.   MRN: [id number]  No chief complaint on file.   HPI Patient is in today for follow up on chronic medical concerns. He continues to struggle with mild cognitive impairmenr, headaches and some disequilibrium and he has an appointment with neurology soon. No increase in these symptoms recently. No recent febrile illness or hospitalizations. He is stressed by sick family members and his own health concerns. He notes some ongoing low back pain. He twisted his left ankle recently but it is improving slowly only mild swelling. Pain is largely at the back of the ankle. Denies CP/palp/SOB/HA/congestion/fevers/GI or GU c/o. Taking meds as prescribed  Past Medical History:  Diagnosis Date   Acquired atrophy of thyroid 06/05/2013   Overview:  July 2014: controlled on synthroid 19mcg since 2012 May 2015: decreased to Synthroid 50 mcg  Last Assessment & Plan:  Pt doing well with most recent TSH within normal range.  Will continue current dosage of Synthroid 52mcg. Pt reminded to take medication on empty stomach. Will continue to monitor with periodic laboratory assessment in 3 months.    Anemia    hemoglobin 7.4, iron deficiency, January, 2011, 2 unit transfusion, endoscopy normal, capsule endoscopy February, 2011 no small bowel abnormalities.   Most likely source gastric erosions, followed by GI   Anxiety    Bradycardia    CAD (coronary artery disease)    A. CABG in 2000,status post cardiac cath in 2006, 2009 ....continued chest pain and SOB despite oral medication adjestments including Ranexa. B. Cath November 2009/ mRCA - 2.75 x 23 Abbott Xience V drug-eluting stent ...11/26/2008 to distal  RCA leading to acute marginal.  C. Cath 07/2012 for CP - stable anatomy, med rx. d. cath 2015 and 05/30/2015 stable anatomy, consider Myoview if has CP again   Carotid artery disease ([place]) 08/01/2015   Doppler, May 29, 2015, 1-39%  bilateral ICA    Cerebral ischemia    MRI November, 2010, chronic microvascular ischemia   CKD (chronic kidney disease), stage III    Concussion    Depression    Bipolar   Diabetes mellitus ([place])    Edema    Essential hypertension 06/05/2013   Overview:  July 2014: Controlled with Bystolic Sep 0865: Changed to atenolol.  Last Assessment & Plan:  Will change to atenolol for cost and follow Improved.  Medication compliance strongly encouraged BP: 116/64 mmHg    Overview:  July 2014: Controlled with Bystolic Sep 7846: Changed to atenolol.  Last Assessment & Plan:  Will change to atenolol for cost and follow   Falling episodes    these have occurred in the past and again recurring 2011   Family history of adverse reaction to anesthesia    "mother died during bypass surgery but not sure if it has to do with anesthesia"   Gastric ulcer    GERD (gastroesophageal reflux disease)    H/O medication noncompliance    Due to loss of insurance   H/O multiple concussions    Hard of hearing    History of blood transfusion 12/20/2013   Hx of CABG    2000,  / one median sternotomy suture broken her chest x-ray November, 2010, no clinical significance   Hyperlipidemia    Hypertension    pt. denies   Iron deficiency anemia    Long term (current) use of anticoagulants [Z79.01] 07/20/2016   Low back pain 06/12/2009  Qualifier: Diagnosis of  By: [name]    Nephrolithiasis    Orthostasis    OSA (obstructive sleep apnea)    PAF (paroxysmal atrial fibrillation) ([place])    a. dx 2017, started on amiodarone/warfarin but patient intermittently noncompliant.   PSVT (paroxysmal supraventricular tachycardia) ([place])    RBBB 07/09/2009   Qualifier: Diagnosis of  By: [name], MD, [facility], [name]    RLS (restless legs syndrome) 09/19/2009   Qualifier: Diagnosis of  By: [name]   Overview:  July 2014: Controlled with Mirapex  Last Assessment & Plan:  Patient is doing well.  Will continue current management and follow clinically.   Seizure disorder ([place]) 01/09/2017   Spondylosis    C5-6, C6-7 MRI 2010   Syncope 03/2016   Thyroid disease    Tubulovillous adenoma of colon 2007   Vitamin D deficiency 05/17/2017   Wears glasses     Past Surgical History:  Procedure Laterality Date   ANTERIOR CERVICAL DECOMP/DISCECTOMY FUSION N/A 05/31/2016   Procedure: ANTERIOR CERVICAL DECOMPRESSION/DISCECTOMY FUSION CERVICAL FIVE-SIX,CERVICAL SIX-SEVEN;  Surgeon: [name], MD;  Location: [place] [facility]URO ORS;  Service: Neurosurgery;  Laterality: N/A;   CARDIAC CATHETERIZATION N/A 05/30/2015   Procedure: Left Heart Cath and Coronary Angiography;  Surgeon: [name], MD;  Location: [place] CV LAB;  Service: Cardiovascular;  Laterality: N/A;   COLONOSCOPY     CORONARY ARTERY BYPASS GRAFT     2000   ESOPHAGOGASTRODUODENOSCOPY     LEFT HEART CATH AND CORS/GRAFTS ANGIOGRAPHY N/A 12/15/2017   Procedure: LEFT HEART CATH AND CORS/GRAFTS ANGIOGRAPHY;  Surgeon: [name], MD;  Location: [place] CV LAB;  Service: Cardiovascular;  Laterality: N/A;   LEFT HEART CATHETERIZATION WITH CORONARY/GRAFT ANGIOGRAM N/A 08/01/2012   Procedure: LEFT HEART CATHETERIZATION WITH Beatrix Fetters;  Surgeon: [name], MD;  Location: [facility] CATH LAB;  Service: Cardiovascular;  Laterality: N/A;   LEFT HEART CATHETERIZATION WITH CORONARY/GRAFT ANGIOGRAM N/A 01/03/2015   Procedure: LEFT HEART CATHETERIZATION WITH Beatrix Fetters;  Surgeon: [name], MD;  Location: [facility] CATH LAB;  Service: Cardiovascular;  Laterality: N/A;   NASAL SEPTUM SURGERY     UP3   PERCUTANEOUS CORONARY STENT INTERVENTION (PCI-S)  10/2008   mRCA PCI  2.75 x 23 Abbott Xience V drug-eluting stent    ULTRASOUND GUIDANCE FOR VASCULAR ACCESS  12/15/2017   Procedure: Ultrasound Guidance For Vascular Access;  Surgeon: [name], MD;  Location: [place] CV LAB;  Service:  Cardiovascular;;    Family History  Problem Relation Age of Onset   Pancreatic cancer Brother    Diabetes Brother    Coronary artery disease Brother    Stroke Brother    Diabetes Brother    Diabetes Mother    Heart failure Mother    Heart failure Father    Hypothyroidism Brother    Coronary artery disease Brother    Other Brother        colon surgery   Heart attack Other        Nephew   Irregular heart beat Daughter    Cancer Maternal Grandmother        unknown    Colon cancer Neg Hx    Stomach cancer Neg Hx    Liver cancer Neg Hx    Rectal cancer Neg Hx    Esophageal cancer Neg Hx     Social History   Socioeconomic History   Marital status: Married    Spouse name:  Not on file   Number of children: 4   Years of education: 13   Highest education level: Not on file  Occupational History   Occupation: retired  Tobacco Use   Smoking status: Never Smoker   Smokeless tobacco: Never Used  Substance and Sexual Activity   Alcohol use: No    Alcohol/week: 0.0 standard drinks    Comment: stopped drinking in 1998   Drug use: No   Sexual activity: Not on file  Other Topics Concern   Not on file  Social History Narrative   Patient is right handed.   Patient does not drink caffeine.   Social Determinants of Health   Financial Resource Strain:    Difficulty of Paying Living Expenses: Not on file  Food Insecurity:    Worried About Charity fundraiser in the Last Year: Not on file   [employer] of Food in the Last Year: Not on file  Transportation Needs:    Lack of Transportation (Medical): Not on file   Lack of Transportation (Non-Medical): Not on file  Physical Activity:    Days of Exercise per Week: Not on file   Minutes of Exercise per Session: Not on file  Stress:    Feeling of Stress : Not on file  Social Connections:    Frequency of Communication with Friends and Family: Not on file   Frequency of Social Gatherings with  Friends and Family: Not on file   Attends Religious Services: Not on file   Active Member of Clubs or Organizations: Not on file   Attends Archivist Meetings: Not on file   Marital Status: Not on file  Intimate Partner Violence:    Fear of Current or Ex-Partner: Not on file   Emotionally Abused: Not on file   Physically Abused: Not on file   Sexually Abused: Not on file    Outpatient Medications Prior to Visit  Medication Sig Dispense Refill   albuterol (VENTOLIN HFA) 108 (90 Base) MCG/ACT inhaler Inhale 2 puffs into the lungs every 6 (six) hours as needed for wheezing or shortness of breath. 18 g 2   atorvastatin (LIPITOR) 80 MG tablet Take 1 tablet (80 mg total) by mouth daily. 90 tablet 3   Continuous Blood Gluc Sensor (FREESTYLE LIBRE 14 DAY SENSOR) MISC 1 Device by Does not apply route every 14 (fourteen) days. 6 each 3   diclofenac sodium (VOLTAREN) 1 % GEL Apply 2 g topically daily as needed (for pain in affected area). 2 Tube 0   divalproex (DEPAKOTE ER) 250 MG 24 hr tablet Take 3 tablets (750 mg total) by mouth at bedtime. 180 tablet 0   ELIQUIS 5 MG TABS tablet TAKE 1 TABLET (5 MG TOTAL) BY MOUTH 2 (TWO) TIMES DAILY. 60 tablet 5   fenofibrate (TRICOR) 145 MG tablet Take 1 tablet (145 mg total) by mouth daily. 30 tablet 3   ferrous sulfate 325 (65 FE) MG tablet Take 1 tablet (325 mg total) by mouth every other day.  3   furosemide (LASIX) 20 MG tablet TAKE 1 TABLET BY MOUTH DAILY AS NEEDED FOR EDEMA 30 tablet 3   gabapentin (NEURONTIN) 100 MG capsule Take 1 in the morning, take 2 in the evening 120 capsule 3   glucose blood (ACCU-CHEK AVIVA) test strip Use as directed to check blood sugar 4 times daily.  DX E11.9 200 each 6   insulin aspart (NOVOLOG FLEXPEN) 100 UNIT/ML FlexPen 3 times a day (just before each  meal) 65-80-130 units 30 pen 11   insulin NPH Human (NOVOLIN N) 100 UNIT/ML injection Inject 0.8 mLs (80 Units total) into the skin at bedtime.  30 mL 11   Insulin Pen Needle 31G X 8 MM MISC 1 each by Does not apply route 3 (three) times daily. E11.9 60 each 0   INSULIN SYRINGE 1CC/29G 29G X 1/2" 1 ML MISC 1 each by Does not apply route daily. Use to inject insulin daily 30 each 0   isosorbide mononitrate (IMDUR) 60 MG 24 hr tablet TAKE TWO TABLETS BY MOUTH DAILY 180 tablet 0   levothyroxine (SYNTHROID) 100 MCG tablet TAKE 1 TABLET (100 MCG TOTAL) BY MOUTH DAILY. 90 tablet 1   loperamide (IMODIUM A-D) 2 MG tablet Take 1 tablet (2 mg total) by mouth 4 (four) times daily as needed for diarrhea or loose stools. 30 tablet 0   metoprolol succinate (TOPROL-XL) 50 MG 24 hr tablet TAKE 1&1/2 TABLETS BY MOUTH ONCE A DAY FOR A TOTAL OF 75 MG. 135 tablet 3   midodrine (PROAMATINE) 10 MG tablet Take 2 tablets (20 mg total) by mouth daily. 180 tablet 2   nitroGLYCERIN (NITROSTAT) 0.4 MG SL tablet PLACE 1 TABLET UNDER THE TONGUE EVERY 5 MINUTES AS NEEDED FOR CHEST PAIN. 25 tablet 1   pantoprazole (PROTONIX) 40 MG tablet TAKE 1 TABLET EVERY DAY 90 tablet 0   ranolazine (RANEXA) 500 MG 12 hr tablet TAKE 1 TABLET (500 MG TOTAL) BY MOUTH 2 (TWO) TIMES DAILY. 60 tablet 0   sertraline (ZOLOFT) 100 MG tablet TAKE 1 TABLET (100 MG TOTAL) BY MOUTH 2 (TWO) TIMES DAILY. 180 tablet 1   tiZANidine (ZANAFLEX) 4 MG tablet Take 1 tablet (4 mg total) by mouth every 6 (six) hours as needed for muscle spasms. 40 tablet 2   Vitamin D, Ergocalciferol, (DRISDOL) 1.25 MG (50000 UT) CAPS capsule Take 1 capsule (50,000 Units total) by mouth every 7 (seven) days. 20 capsule 0   pramipexole (MIRAPEX) 1 MG tablet TAKE 1/2 TO 1 TABLET AT BEDTIME 90 tablet 0   No facility-administered medications prior to visit.    Allergies  Allergen Reactions   Morphine Other (See Comments)    hallucinations   Shellfish-Derived Products Itching    Review of Systems  Constitutional: Positive for malaise/fatigue. Negative for fever.  HENT: Negative for congestion.   Eyes:  Negative for blurred vision.  Respiratory: Negative for shortness of breath.   Cardiovascular: Negative for chest pain, palpitations and leg swelling.  Gastrointestinal: Negative for abdominal pain, blood in stool and nausea.  Genitourinary: Negative for dysuria and frequency.  Musculoskeletal: Positive for back pain, joint pain and myalgias. Negative for falls.  Skin: Negative for rash.  Neurological: Positive for dizziness and headaches. Negative for loss of consciousness.  Endo/Heme/Allergies: Negative for environmental allergies.  Psychiatric/Behavioral: Negative for depression. The patient is nervous/anxious.        Objective:    Physical Exam Vitals and nursing note reviewed.  Constitutional:      General: He is not in acute distress.    Appearance: He is well-developed.  HENT:     Head: Normocephalic and atraumatic.     Nose: Nose normal.  Eyes:     General:        Right eye: No discharge.        Left eye: No discharge.  Cardiovascular:     Rate and Rhythm: Normal rate. Rhythm irregular.     Heart sounds: Murmur present.  Pulmonary:  Effort: Pulmonary effort is normal.     Breath sounds: Normal breath sounds.  Abdominal:     General: Bowel sounds are normal.     Palpations: Abdomen is soft.     Tenderness: There is no abdominal tenderness.  Musculoskeletal:     Cervical back: Normal range of motion and neck supple.  Skin:    General: Skin is warm and dry.  Neurological:     Mental Status: He is alert and oriented to person, place, and time.     BP 122/82 (BP Location: Left Arm, Patient Position: Sitting, Cuff Size: Normal)    Pulse 68    Temp 98.2 F (36.8 C) (Temporal)    Resp 18    Wt 218 lb 12.8 oz (99.2 kg)    SpO2 98%    BMI 33.27 kg/m  Wt Readings from Last 3 Encounters:  12/27/19 218 lb 12.8 oz (99.2 kg)  11/07/19 214 lb (97.1 kg)  10/10/19 215 lb 9.6 oz (97.8 kg)    Diabetic Foot Exam - Simple   No data filed     Lab Results  Component  Value Date   WBC 7.6 12/27/2019   HGB 13.4 12/27/2019   HCT 39.9 12/27/2019   PLT 213.0 12/27/2019   GLUCOSE 206 (H) 12/27/2019   CHOL 123 12/27/2019   TRIG 117.0 12/27/2019   HDL 34.50 (L) 12/27/2019   LDLDIRECT 77.0 08/20/2019   LDLCALC 65 12/27/2019   ALT 28 12/27/2019   AST 27 12/27/2019   NA 137 12/27/2019   K 4.7 12/27/2019   CL 104 12/27/2019   CREATININE 1.94 (H) 12/27/2019   BUN 28 (H) 12/27/2019   CO2 26 12/27/2019   TSH 4.38 12/27/2019   PSA 3.14 08/20/2019   INR 2.7 04/23/2019   HGBA1C 7.4 (A) 11/07/2019   MICROALBUR <0.7 05/14/2019    Lab Results  Component Value Date   TSH 4.38 12/27/2019   Lab Results  Component Value Date   WBC 7.6 12/27/2019   HGB 13.4 12/27/2019   HCT 39.9 12/27/2019   MCV 92.6 12/27/2019   PLT 213.0 12/27/2019   Lab Results  Component Value Date   NA 137 12/27/2019   K 4.7 12/27/2019   CO2 26 12/27/2019   GLUCOSE 206 (H) 12/27/2019   BUN 28 (H) 12/27/2019   CREATININE 1.94 (H) 12/27/2019   BILITOT 0.6 12/27/2019   ALKPHOS 57 12/27/2019   AST 27 12/27/2019   ALT 28 12/27/2019   PROT 7.0 12/27/2019   ALBUMIN 4.5 12/27/2019   CALCIUM 9.7 12/27/2019   ANIONGAP 11 11/07/2018   GFR 34.26 (L) 12/27/2019   Lab Results  Component Value Date   CHOL 123 12/27/2019   Lab Results  Component Value Date   HDL 34.50 (L) 12/27/2019   Lab Results  Component Value Date   LDLCALC 65 12/27/2019   Lab Results  Component Value Date   TRIG 117.0 12/27/2019   Lab Results  Component Value Date   CHOLHDL 4 12/27/2019   Lab Results  Component Value Date   HGBA1C 7.4 (A) 11/07/2019       Assessment & Plan:   Problem List Items Addressed This Visit    Diabetes mellitus type II, uncontrolled ([place]) - Primary (Chronic)   RLS (restless legs syndrome)    Not responding to Mirapex will try switching to Requip and reassess. Reminded to stay active and hydrate well      Anemia   Relevant Orders   CBC (Completed)  Hyperlipidemia LDL goal <70    Encouraged heart healthy diet, increase exercise, avoid trans fats, consider a krill oil cap daily. Continue current meds      Relevant Orders   Lipid panel (Completed)   Essential hypertension    Well controlled, no changes to meds. Encouraged heart healthy diet such as the DASH diet and exercise as tolerated.       Relevant Orders   CBC (Completed)   Comprehensive metabolic panel (Completed)   TSH (Completed)   Headache    Likely multifactorial and includes post concussive, tension, dehydration and more but has been resistent to treatment so he has an appt with neurology next month which he agrees to keep      Atrial fibrillation ([place]) [I48.91]    Rate controlled and tolerating Eliquis      Hypothyroidism due to acquired atrophy of thyroid    On Levothyroxine, continue to monitor      Left ankle pain    Twisted it recently and improving adequately. Encouraged RICE and let us know if does not resolve so we can consider referral         I have discontinued Cordarrell A. Rane's pramipexole. I am also having him start on rOPINIRole. Additionally, I am having him maintain his loperamide, diclofenac sodium, divalproex, Vitamin D (Ergocalciferol), glucose blood, tiZANidine, atorvastatin, metoprolol succinate, ferrous sulfate, albuterol, midodrine, fenofibrate, INSULIN SYRINGE 1CC/29G, FreeStyle Libre 14 Day Sensor, insulin NPH Human, gabapentin, sertraline, NovoLOG FlexPen, Insulin Pen Needle, pantoprazole, Eliquis, isosorbide mononitrate, furosemide, nitroGLYCERIN, levothyroxine, and ranolazine.  Meds ordered this encounter  Medications   rOPINIRole (REQUIP) 1 MG tablet    Sig: Take 1 tablet (1 mg total) by mouth at bedtime.    Dispense:  30 tablet    Refill:  1     Penni Homans, MD

## 2019-12-30 NOTE — Assessment & Plan Note (Signed)
Not responding to Mirapex will try switching to Requip and reassess. Reminded to stay active and hydrate well

## 2019-12-30 NOTE — Assessment & Plan Note (Signed)
Well controlled, no changes to meds. Encouraged heart healthy diet such as the DASH diet and exercise as tolerated.  °

## 2019-12-30 NOTE — Assessment & Plan Note (Signed)
Rate controlled and tolerating Eliquis  

## 2019-12-30 NOTE — Assessment & Plan Note (Signed)
Twisted it recently and improving adequately. Encouraged RICE and let us know if does not resolve so we can consider referral

## 2019-12-30 NOTE — Assessment & Plan Note (Signed)
On Levothyroxine, continue to monitor 

## 2020-01-05 ENCOUNTER — Other Ambulatory Visit: Payer: Self-pay

## 2020-01-05 ENCOUNTER — Emergency Department (HOSPITAL_COMMUNITY): Payer: Medicare PPO

## 2020-01-05 ENCOUNTER — Encounter (HOSPITAL_COMMUNITY): Payer: Self-pay | Admitting: Emergency Medicine

## 2020-01-05 ENCOUNTER — Inpatient Hospital Stay (HOSPITAL_COMMUNITY)
Admission: EM | Admit: 2020-01-05 | Discharge: 2020-01-08 | DRG: 287 | Disposition: A | Discharge status: Home / Self Care | Payer: Medicare PPO | Attending: Cardiology | Admitting: Cardiology

## 2020-01-05 ENCOUNTER — Inpatient Hospital Stay (HOSPITAL_COMMUNITY): Payer: Medicare PPO

## 2020-01-05 DIAGNOSIS — I2510 Atherosclerotic heart disease of native coronary artery without angina pectoris: Secondary | ICD-10-CM | POA: Diagnosis present

## 2020-01-05 DIAGNOSIS — F419 Anxiety disorder, unspecified: Secondary | ICD-10-CM | POA: Diagnosis not present

## 2020-01-05 DIAGNOSIS — G2581 Restless legs syndrome: Secondary | ICD-10-CM | POA: Diagnosis present

## 2020-01-05 DIAGNOSIS — I471 Supraventricular tachycardia: Secondary | ICD-10-CM | POA: Diagnosis not present

## 2020-01-05 DIAGNOSIS — E1122 Type 2 diabetes mellitus with diabetic chronic kidney disease: Secondary | ICD-10-CM | POA: Diagnosis present

## 2020-01-05 DIAGNOSIS — Z885 Allergy status to narcotic agent status: Secondary | ICD-10-CM

## 2020-01-05 DIAGNOSIS — I4510 Unspecified right bundle-branch block: Secondary | ICD-10-CM | POA: Diagnosis not present

## 2020-01-05 DIAGNOSIS — H9190 Unspecified hearing loss, unspecified ear: Secondary | ICD-10-CM | POA: Diagnosis present

## 2020-01-05 DIAGNOSIS — N1832 Chronic kidney disease, stage 3b: Secondary | ICD-10-CM | POA: Diagnosis present

## 2020-01-05 DIAGNOSIS — Z8249 Family history of ischemic heart disease and other diseases of the circulatory system: Secondary | ICD-10-CM

## 2020-01-05 DIAGNOSIS — Z9119 Patient's noncompliance with other medical treatment and regimen: Secondary | ICD-10-CM

## 2020-01-05 DIAGNOSIS — IMO0002 Reserved for concepts with insufficient information to code with codable children: Secondary | ICD-10-CM | POA: Diagnosis present

## 2020-01-05 DIAGNOSIS — Z91013 Allergy to seafood: Secondary | ICD-10-CM

## 2020-01-05 DIAGNOSIS — Z87442 Personal history of urinary calculi: Secondary | ICD-10-CM

## 2020-01-05 DIAGNOSIS — E559 Vitamin D deficiency, unspecified: Secondary | ICD-10-CM | POA: Diagnosis present

## 2020-01-05 DIAGNOSIS — G40909 Epilepsy, unspecified, not intractable, without status epilepticus: Secondary | ICD-10-CM | POA: Diagnosis present

## 2020-01-05 DIAGNOSIS — Z79899 Other long term (current) drug therapy: Secondary | ICD-10-CM

## 2020-01-05 DIAGNOSIS — E1169 Type 2 diabetes mellitus with other specified complication: Secondary | ICD-10-CM | POA: Diagnosis not present

## 2020-01-05 DIAGNOSIS — E1165 Type 2 diabetes mellitus with hyperglycemia: Secondary | ICD-10-CM | POA: Diagnosis present

## 2020-01-05 DIAGNOSIS — I25111 Atherosclerotic heart disease of native coronary artery with angina pectoris with documented spasm: Secondary | ICD-10-CM | POA: Diagnosis not present

## 2020-01-05 DIAGNOSIS — I200 Unstable angina: Secondary | ICD-10-CM | POA: Diagnosis present

## 2020-01-05 DIAGNOSIS — E079 Disorder of thyroid, unspecified: Secondary | ICD-10-CM | POA: Diagnosis present

## 2020-01-05 DIAGNOSIS — I951 Orthostatic hypotension: Secondary | ICD-10-CM | POA: Diagnosis present

## 2020-01-05 DIAGNOSIS — M47812 Spondylosis without myelopathy or radiculopathy, cervical region: Secondary | ICD-10-CM | POA: Diagnosis present

## 2020-01-05 DIAGNOSIS — N183 Chronic kidney disease, stage 3 unspecified: Secondary | ICD-10-CM | POA: Diagnosis not present

## 2020-01-05 DIAGNOSIS — I480 Paroxysmal atrial fibrillation: Secondary | ICD-10-CM | POA: Diagnosis not present

## 2020-01-05 DIAGNOSIS — Z981 Arthrodesis status: Secondary | ICD-10-CM

## 2020-01-05 DIAGNOSIS — K219 Gastro-esophageal reflux disease without esophagitis: Secondary | ICD-10-CM | POA: Diagnosis present

## 2020-01-05 DIAGNOSIS — I5032 Chronic diastolic (congestive) heart failure: Secondary | ICD-10-CM | POA: Diagnosis not present

## 2020-01-05 DIAGNOSIS — R072 Precordial pain: Secondary | ICD-10-CM | POA: Diagnosis not present

## 2020-01-05 DIAGNOSIS — Z79890 Hormone replacement therapy (postmenopausal): Secondary | ICD-10-CM

## 2020-01-05 DIAGNOSIS — F319 Bipolar disorder, unspecified: Secondary | ICD-10-CM | POA: Diagnosis not present

## 2020-01-05 DIAGNOSIS — I25110 Atherosclerotic heart disease of native coronary artery with unstable angina pectoris: Principal | ICD-10-CM | POA: Diagnosis present

## 2020-01-05 DIAGNOSIS — I272 Pulmonary hypertension, unspecified: Secondary | ICD-10-CM | POA: Diagnosis present

## 2020-01-05 DIAGNOSIS — E034 Atrophy of thyroid (acquired): Secondary | ICD-10-CM | POA: Diagnosis present

## 2020-01-05 DIAGNOSIS — Z951 Presence of aortocoronary bypass graft: Secondary | ICD-10-CM

## 2020-01-05 DIAGNOSIS — N184 Chronic kidney disease, stage 4 (severe): Secondary | ICD-10-CM

## 2020-01-05 DIAGNOSIS — R296 Repeated falls: Secondary | ICD-10-CM | POA: Diagnosis present

## 2020-01-05 DIAGNOSIS — Z794 Long term (current) use of insulin: Secondary | ICD-10-CM | POA: Diagnosis not present

## 2020-01-05 DIAGNOSIS — G4733 Obstructive sleep apnea (adult) (pediatric): Secondary | ICD-10-CM | POA: Diagnosis present

## 2020-01-05 DIAGNOSIS — E1121 Type 2 diabetes mellitus with diabetic nephropathy: Secondary | ICD-10-CM | POA: Diagnosis not present

## 2020-01-05 DIAGNOSIS — R079 Chest pain, unspecified: Secondary | ICD-10-CM | POA: Diagnosis not present

## 2020-01-05 DIAGNOSIS — I130 Hypertensive heart and chronic kidney disease with heart failure and stage 1 through stage 4 chronic kidney disease, or unspecified chronic kidney disease: Secondary | ICD-10-CM | POA: Diagnosis present

## 2020-01-05 DIAGNOSIS — Z20822 Contact with and (suspected) exposure to covid-19: Secondary | ICD-10-CM | POA: Diagnosis not present

## 2020-01-05 DIAGNOSIS — Z7901 Long term (current) use of anticoagulants: Secondary | ICD-10-CM

## 2020-01-05 DIAGNOSIS — Z833 Family history of diabetes mellitus: Secondary | ICD-10-CM

## 2020-01-05 DIAGNOSIS — R0602 Shortness of breath: Secondary | ICD-10-CM | POA: Diagnosis not present

## 2020-01-05 DIAGNOSIS — I25119 Atherosclerotic heart disease of native coronary artery with unspecified angina pectoris: Secondary | ICD-10-CM | POA: Diagnosis not present

## 2020-01-05 DIAGNOSIS — N189 Chronic kidney disease, unspecified: Secondary | ICD-10-CM | POA: Diagnosis not present

## 2020-01-05 DIAGNOSIS — E785 Hyperlipidemia, unspecified: Secondary | ICD-10-CM | POA: Diagnosis present

## 2020-01-05 DIAGNOSIS — I340 Nonrheumatic mitral (valve) insufficiency: Secondary | ICD-10-CM | POA: Diagnosis not present

## 2020-01-05 LAB — CBC
HCT: 39.4 % (ref 39.0–52.0)
Hemoglobin: 13.3 g/dL (ref 13.0–17.0)
MCH: 31.4 pg (ref 26.0–34.0)
MCHC: 33.8 g/dL (ref 30.0–36.0)
MCV: 93.1 fL (ref 80.0–100.0)
Platelets: 232 10*3/uL (ref 150–400)
RBC: 4.23 MIL/uL (ref 4.22–5.81)
RDW: 14.1 % (ref 11.5–15.5)
WBC: 7.9 10*3/uL (ref 4.0–10.5)
nRBC: 0 % (ref 0.0–0.2)

## 2020-01-05 LAB — BASIC METABOLIC PANEL
Anion gap: 9 (ref 5–15)
BUN: 25 mg/dL — ABNORMAL HIGH (ref 8–23)
CO2: 24 mmol/L (ref 22–32)
Calcium: 9.5 mg/dL (ref 8.9–10.3)
Chloride: 107 mmol/L (ref 98–111)
Creatinine, Ser: 2.13 mg/dL — ABNORMAL HIGH (ref 0.61–1.24)
GFR calc Af Amer: 35 mL/min — ABNORMAL LOW (ref >60)
GFR calc non Af Amer: 30 mL/min — ABNORMAL LOW (ref >60)
Glucose, Bld: 253 mg/dL — ABNORMAL HIGH (ref 70–99)
Potassium: 4.7 mmol/L (ref 3.5–5.1)
Sodium: 140 mmol/L (ref 135–145)

## 2020-01-05 LAB — HEPATIC FUNCTION PANEL
ALT: 31 U/L (ref 0–44)
AST: 30 U/L (ref 15–41)
Albumin: 4.1 g/dL (ref 3.5–5.0)
Alkaline Phosphatase: 48 U/L (ref 38–126)
Bilirubin, Direct: 0.1 mg/dL (ref 0.0–0.2)
Indirect Bilirubin: 1 mg/dL — ABNORMAL HIGH (ref 0.3–0.9)
Total Bilirubin: 1.1 mg/dL (ref 0.3–1.2)
Total Protein: 7.1 g/dL (ref 6.5–8.1)

## 2020-01-05 LAB — RESPIRATORY PANEL BY RT PCR (FLU A&B, COVID)
Influenza A by PCR: NEGATIVE
Influenza B by PCR: NEGATIVE
SARS Coronavirus 2 by RT PCR: NEGATIVE

## 2020-01-05 LAB — TROPONIN I (HIGH SENSITIVITY)
Troponin I (High Sensitivity): 4 ng/L (ref <18)
Troponin I (High Sensitivity): 4 ng/L (ref <18)

## 2020-01-05 MED ORDER — ASPIRIN EC 81 MG PO TBEC
81.0000 mg | DELAYED_RELEASE_TABLET | Freq: Every day | ORAL | Status: DC
  Administered 2020-01-06 – 2020-01-08 (×3): 81 mg via ORAL
  Filled 2020-01-05 (×3): qty 1

## 2020-01-05 MED ORDER — FERROUS SULFATE 325 (65 FE) MG PO TABS
325.0000 mg | ORAL_TABLET | ORAL | Status: DC
  Administered 2020-01-07: 325 mg via ORAL
  Filled 2020-01-05: qty 1

## 2020-01-05 MED ORDER — RANOLAZINE ER 500 MG PO TB12
1000.0000 mg | ORAL_TABLET | Freq: Two times a day (BID) | ORAL | Status: DC
  Administered 2020-01-05 – 2020-01-08 (×6): 1000 mg via ORAL
  Filled 2020-01-05 (×6): qty 2

## 2020-01-05 MED ORDER — LEVOTHYROXINE SODIUM 100 MCG PO TABS
100.0000 ug | ORAL_TABLET | Freq: Every day | ORAL | Status: DC
  Administered 2020-01-06 – 2020-01-08 (×3): 100 ug via ORAL
  Filled 2020-01-05 (×3): qty 1

## 2020-01-05 MED ORDER — ISOSORBIDE MONONITRATE ER 60 MG PO TB24
120.0000 mg | ORAL_TABLET | Freq: Every day | ORAL | Status: DC
  Administered 2020-01-06 – 2020-01-07 (×2): 120 mg via ORAL
  Filled 2020-01-05 (×2): qty 2

## 2020-01-05 MED ORDER — SODIUM CHLORIDE 0.9 % IV SOLN: INTRAVENOUS | Status: DC

## 2020-01-05 MED ORDER — MIDODRINE HCL 5 MG PO TABS
10.0000 mg | ORAL_TABLET | Freq: Two times a day (BID) | ORAL | Status: DC
  Administered 2020-01-06 (×2): 10 mg via ORAL
  Filled 2020-01-05 (×2): qty 2

## 2020-01-05 MED ORDER — PANTOPRAZOLE SODIUM 40 MG PO TBEC
40.0000 mg | DELAYED_RELEASE_TABLET | Freq: Every day | ORAL | Status: DC
  Administered 2020-01-06 – 2020-01-08 (×3): 40 mg via ORAL
  Filled 2020-01-05 (×3): qty 1

## 2020-01-05 MED ORDER — ONDANSETRON HCL 4 MG/2ML IJ SOLN: 4.0000 mg | Freq: Four times a day (QID) | INTRAMUSCULAR | Status: DC | PRN

## 2020-01-05 MED ORDER — ATORVASTATIN CALCIUM 80 MG PO TABS
80.0000 mg | ORAL_TABLET | Freq: Every day | ORAL | Status: DC
  Administered 2020-01-06 – 2020-01-07 (×2): 80 mg via ORAL
  Filled 2020-01-05 (×2): qty 1

## 2020-01-05 MED ORDER — FENOFIBRATE 160 MG PO TABS
160.0000 mg | ORAL_TABLET | Freq: Every day | ORAL | Status: DC
  Administered 2020-01-06 – 2020-01-08 (×3): 160 mg via ORAL
  Filled 2020-01-05 (×3): qty 1

## 2020-01-05 MED ORDER — HYDROMORPHONE HCL 1 MG/ML IJ SOLN
0.5000 mg | Freq: Once | INTRAMUSCULAR | Status: AC
  Administered 2020-01-05: 0.5 mg via INTRAVENOUS
  Filled 2020-01-05: qty 1

## 2020-01-05 MED ORDER — SODIUM CHLORIDE 0.9% FLUSH
3.0000 mL | Freq: Once | INTRAVENOUS | Status: AC
  Administered 2020-01-05: 13:00:00 3 mL via INTRAVENOUS

## 2020-01-05 MED ORDER — ACETAMINOPHEN 325 MG PO TABS: 650.0000 mg | ORAL_TABLET | ORAL | Status: DC | PRN

## 2020-01-05 MED ORDER — SERTRALINE HCL 100 MG PO TABS
100.0000 mg | ORAL_TABLET | Freq: Two times a day (BID) | ORAL | Status: DC
  Administered 2020-01-05 – 2020-01-08 (×6): 100 mg via ORAL
  Filled 2020-01-05 (×6): qty 1

## 2020-01-05 MED ORDER — HEPARIN (PORCINE) 25000 UT/250ML-% IV SOLN
1450.0000 [IU]/h | INTRAVENOUS | Status: DC
  Administered 2020-01-05: 1350 [IU]/h via INTRAVENOUS
  Administered 2020-01-06: 1450 [IU]/h via INTRAVENOUS
  Filled 2020-01-05 (×3): qty 250

## 2020-01-05 MED ORDER — ASPIRIN 300 MG RE SUPP: 300.0000 mg | RECTAL | Status: AC

## 2020-01-05 MED ORDER — INSULIN NPH (HUMAN) (ISOPHANE) 100 UNIT/ML ~~LOC~~ SUSP
80.0000 [IU] | Freq: Every day | SUBCUTANEOUS | Status: DC
  Administered 2020-01-05 – 2020-01-07 (×3): 80 [IU] via SUBCUTANEOUS
  Filled 2020-01-05: qty 10

## 2020-01-05 MED ORDER — METOPROLOL SUCCINATE ER 50 MG PO TB24
75.0000 mg | ORAL_TABLET | Freq: Every day | ORAL | Status: DC
  Administered 2020-01-06 – 2020-01-08 (×3): 75 mg via ORAL
  Filled 2020-01-05 (×3): qty 1

## 2020-01-05 MED ORDER — ASPIRIN 81 MG PO CHEW
324.0000 mg | CHEWABLE_TABLET | ORAL | Status: AC
  Administered 2020-01-05: 324 mg via ORAL
  Filled 2020-01-05: qty 4

## 2020-01-05 MED ORDER — NITROGLYCERIN 0.4 MG SL SUBL
0.4000 mg | SUBLINGUAL_TABLET | SUBLINGUAL | Status: DC | PRN
  Administered 2020-01-05: 13:00:00 0.4 mg via SUBLINGUAL
  Filled 2020-01-05: qty 1

## 2020-01-05 NOTE — ED Notes (Signed)
2nd Page to Cardiology

## 2020-01-05 NOTE — Progress Notes (Signed)
ANTICOAGULATION CONSULT NOTE - Initial Consult  Pharmacy Consult for Heparin Indication: atrial fibrillation  Allergies  Allergen Reactions  . Morphine Other (See Comments)    hallucinations  . Shellfish-Derived Products Itching    Patient Measurements: Height: 5\' 8"  (172.7 cm) Weight: 218 lb 11.1 oz (99.2 kg) IBW/kg (Calculated) : 68.4 Heparin Dosing Weight: 89.6 kg  Vital Signs: Temp: 97.5 F (36.4 C) (01/30 1219) Temp Source: Oral (01/30 1219) BP: 127/68 (01/30 1630) Pulse Rate: 60 (01/30 1630)  Labs: Recent Labs    01/05/20 1234 01/05/20 1632  HGB 13.3  --   HCT 39.4  --   PLT 232  --   CREATININE 2.13*  --   TROPONINIHS 4 4    Estimated Creatinine Clearance: 36.3 mL/min (A) (by C-G formula based on SCr of 2.13 mg/dL (H)).   Medical History: Past Medical History:  Diagnosis Date  . Acquired atrophy of thyroid 06/05/2013   Overview:  July 2014: controlled on synthroid 21mcg since 2012 May 2015: decreased to Synthroid 50 mcg  Last Assessment & Plan:  Pt doing well with most recent TSH within normal range.  Will continue current dosage of Synthroid 61mcg. Pt reminded to take medication on empty stomach. Will continue to monitor with periodic laboratory assessment in 3 months.   . Anemia    hemoglobin 7.4, iron deficiency, January, 2011, 2 unit transfusion, endoscopy normal, capsule endoscopy February, 2011 no small bowel abnormalities.   Most likely source gastric erosions, followed by GI  . Anxiety   . Bradycardia   . CAD (coronary artery disease)    A. CABG in 2000,status post cardiac cath in 2006, 2009 ....continued chest pain and SOB despite oral medication adjestments including Ranexa. B. Cath November 2009/ mRCA - 2.75 x 23 Abbott Xience V drug-eluting stent ...11/26/2008 to distal  RCA leading to acute marginal.  C. Cath 07/2012 for CP - stable anatomy, med rx. d. cath 2015 and 05/30/2015 stable anatomy, consider Myoview if has CP again  . Carotid artery disease  ([place]) 08/01/2015   Doppler, May 29, 2015, 1-39% bilateral ICA   . Cerebral ischemia    MRI November, 2010, chronic microvascular ischemia  . CKD (chronic kidney disease), stage III   . Concussion   . Depression    Bipolar  . Diabetes mellitus ([place])   . Edema   . Essential hypertension 06/05/2013   Overview:  July 2014: Controlled with Bystolic Sep 1610: Changed to atenolol.  Last Assessment & Plan:  Will change to atenolol for cost and follow Improved.  Medication compliance strongly encouraged BP: 116/64 mmHg    Overview:  July 2014: Controlled with Bystolic Sep 9604: Changed to atenolol.  Last Assessment & Plan:  Will change to atenolol for cost and follow  . Falling episodes    these have occurred in the past and again recurring 2011  . Family history of adverse reaction to anesthesia    "mother died during bypass surgery but not sure if it has to do with anesthesia"  . Gastric ulcer   . GERD (gastroesophageal reflux disease)   . H/O medication noncompliance    Due to loss of insurance  . H/O multiple concussions   . Hard of hearing   . History of blood transfusion 12/20/2013  . Hx of CABG    2000,  / one median sternotomy suture broken her chest x-ray November, 2010, no clinical significance  . Hyperlipidemia   . Hypertension    pt. denies  .  Iron deficiency anemia   . Long term (current) use of anticoagulants [Z79.01] 07/20/2016  . Low back pain 06/12/2009   Qualifier: Diagnosis of  By: [name]   . Nephrolithiasis   . Orthostasis   . OSA (obstructive sleep apnea)   . PAF (paroxysmal atrial fibrillation) ([place])    a. dx 2017, started on amiodarone/warfarin but patient intermittently noncompliant.  Marland Kitchen PSVT (paroxysmal supraventricular tachycardia) ([place])   . RBBB 07/09/2009   Qualifier: Diagnosis of  By: [name], MD, [name] [name]   . RLS (restless legs syndrome) 09/19/2009   Qualifier: Diagnosis of  By: [name]   Overview:  July 2014: Controlled with Mirapex   Last Assessment & Plan:  Patient is doing well. Will continue current management and follow clinically.  . Seizure disorder ([place]) 01/09/2017  . Spondylosis    C5-6, C6-7 MRI 2010  . Syncope 03/2016  . Thyroid disease   . Tubulovillous adenoma of colon 2007  . Vitamin D deficiency 05/17/2017  . Wears glasses     Medications:  Scheduled:    Assessment: Patient is a 60 yom that presents to the ED with chest pain. The patient has an extensive hx of cardiac conditions (CAD, CABG, PAF, PSVT). The patient is currently on apixaban for A-fib. Pharmacy has been asked to dose heparin in this patient at this time as likely will require a cath this admission.   Goal of Therapy:  Heparin level 0.3-0.7 units/ml aPTT 66-102 seconds Monitor platelets by anticoagulation protocol: Yes   Plan:  - Will start Heparin drip @ 1350 units/hr on 1/30 @ 1900 (~12hr last apixaban dose)  - Will monitor with aPTT until heparin levels and aPTT correlate  - Will obtain aPTT ~6 hours after starting heparin drip - Monitor patient for s/s of bleeding and CBC while on heparin   Duanne Limerick PharmD. BCPS 01/05/2020,5:39 PM

## 2020-01-05 NOTE — H&P (Addendum)
Cardiology Admission History and Physical:   Patient ID: [name] [name] MRN: [id number]; DOB: 1946/11/13   Admission date: 01/05/2020  Primary Care Provider: [name], MD Primary Cardiologist: Larae Grooms, MD  Primary Electrophysiologist:  None   Chief Complaint:  Chest pain  Patient Profile:   [name] [name] is a 74 y.o. male with past medical history of CAD s/p CABG, PAF, PSVT, CKD stage III, noncompliance, orthostatic hypotension, iron deficiency anemia, chronic diastolic CHF, HTN, HLD, DM 2, right bundle branch block and history of kidney stone  History of Present Illness:   [name] [name] is a 74 year old male with past medical history of CAD s/p CABG, PAF, PSVT, CKD stage III, noncompliance, orthostatic hypotension, iron deficiency anemia, chronic diastolic CHF, HTN, HLD, DM 2, right bundle branch block and history of kidney stone.  His last PCI was in 2009.  Cardiac catheterization in 2015, 2016 and the 2019 showed stable anatomy with patent bypass grafts, medical therapy was recommended for possible microvascular angina.  He had near syncope and transient confusion in 2017.  MRI of the head was negative for stroke.  There was no significant carotid artery disease by CTA.  TIA could not be ruled out.  Therefore aspirin was switched to Plavix.  Previous heart monitor demonstrated atrial fibrillation/atrial flutter and Plavix was transitioned to Coumadin which later switched to Eliquis.  His last visit with [name] was in April 2020, at which time, he complained of worsening dyspnea for a few months and he felt this may be related to weight gain.  Most recent echocardiogram obtained on 08/28/2019 showed EF 60 to 65%, no significant valve issue.  For the past month, he has been noticing worsening dyspnea on exertion and exertional chest discomfort.  Although this initially only occurred with physical activity, later he started noticing chest discomfort even at rest.   Chest pain radiating down the left arm.  On the night of 01/04/2020, he started having recurrent chest pain at rest, however this time it did not go away.  He came to visit his wife who is currently admitted to the hospital pending discharge, due to worsening chest pain, he was sent to the ED.  EKG showed a chronic right bundle branch block, sinus bradycardia, otherwise no significant ST-T wave changes.  Lab work does show worsening renal function when compared to the previous lab on 12/27/2019.  Patient was instructed by his PCP recently to drink at least 60 to 80 ounces of fluid per day.  He says he has been following the instruction for self hydration.  Despite prolonged episode of chest pain, high-sensitivity troponin was 4.   Heart Pathway Score:     Past Medical History:  Diagnosis Date  . Acquired atrophy of thyroid 06/05/2013   Overview:  July 2014: controlled on synthroid 68mcg since 2012 May 2015: decreased to Synthroid 50 mcg  Last Assessment & Plan:  Pt doing well with most recent TSH within normal range.  Will continue current dosage of Synthroid 49mcg. Pt reminded to take medication on empty stomach. Will continue to monitor with periodic laboratory assessment in 3 months.   . Anemia    hemoglobin 7.4, iron deficiency, January, 2011, 2 unit transfusion, endoscopy normal, capsule endoscopy February, 2011 no small bowel abnormalities.   Most likely source gastric erosions, followed by GI  . Anxiety   . Bradycardia   . CAD (coronary artery disease)    A. CABG in 2000,status post cardiac cath in 2006, 2009 .Marland KitchenMarland KitchenMarland Kitchen  continued chest pain and SOB despite oral medication adjestments including Ranexa. B. Cath November 2009/ mRCA - 2.75 x 23 Abbott Xience V drug-eluting stent ...11/26/2008 to distal  RCA leading to acute marginal.  C. Cath 07/2012 for CP - stable anatomy, med rx. d. cath 2015 and 05/30/2015 stable anatomy, consider Myoview if has CP again  . Carotid artery disease ([place]) 08/01/2015   Doppler,  May 29, 2015, 1-39% bilateral ICA   . Cerebral ischemia    MRI November, 2010, chronic microvascular ischemia  . CKD (chronic kidney disease), stage III   . Concussion   . Depression    Bipolar  . Diabetes mellitus ([place])   . Edema   . Essential hypertension 06/05/2013   Overview:  July 2014: Controlled with Bystolic Sep 8338: Changed to atenolol.  Last Assessment & Plan:  Will change to atenolol for cost and follow Improved.  Medication compliance strongly encouraged BP: 116/64 mmHg    Overview:  July 2014: Controlled with Bystolic Sep 2505: Changed to atenolol.  Last Assessment & Plan:  Will change to atenolol for cost and follow  . Falling episodes    these have occurred in the past and again recurring 2011  . Family history of adverse reaction to anesthesia    "mother died during bypass surgery but not sure if it has to do with anesthesia"  . Gastric ulcer   . GERD (gastroesophageal reflux disease)   . H/O medication noncompliance    Due to loss of insurance  . H/O multiple concussions   . Hard of hearing   . History of blood transfusion 12/20/2013  . Hx of CABG    2000,  / one median sternotomy suture broken her chest x-ray November, 2010, no clinical significance  . Hyperlipidemia   . Hypertension    pt. denies  . Iron deficiency anemia   . Long term (current) use of anticoagulants [Z79.01] 07/20/2016  . Low back pain 06/12/2009   Qualifier: Diagnosis of  By: [name]   . Nephrolithiasis   . Orthostasis   . OSA (obstructive sleep apnea)   . PAF (paroxysmal atrial fibrillation) ([place])    a. dx 2017, started on amiodarone/warfarin but patient intermittently noncompliant.  Marland Kitchen PSVT (paroxysmal supraventricular tachycardia) ([place])   . RBBB 07/09/2009   Qualifier: Diagnosis of  By: [name], MD, [name] [name]   . RLS (restless legs syndrome) 09/19/2009   Qualifier: Diagnosis of  By: [name]   Overview:  July 2014: Controlled with Mirapex  Last Assessment & Plan:   Patient is doing well. Will continue current management and follow clinically.  . Seizure disorder ([place]) 01/09/2017  . Spondylosis    C5-6, C6-7 MRI 2010  . Syncope 03/2016  . Thyroid disease   . Tubulovillous adenoma of colon 2007  . Vitamin D deficiency 05/17/2017  . Wears glasses     Past Surgical History:  Procedure Laterality Date  . ANTERIOR CERVICAL DECOMP/DISCECTOMY FUSION N/A 05/31/2016   Procedure: ANTERIOR CERVICAL DECOMPRESSION/DISCECTOMY FUSION CERVICAL FIVE-SIX,CERVICAL SIX-SEVEN;  Surgeon: [name], MD;  Location: [place];  Service: Neurosurgery;  Laterality: N/A;  . CARDIAC CATHETERIZATION N/A 05/30/2015   Procedure: Left Heart Cath and Coronary Angiography;  Surgeon: [name], MD;  Location: [facility];  Service: Cardiovascular;  Laterality: N/A;  . COLONOSCOPY    . CORONARY ARTERY BYPASS GRAFT     2000  . ESOPHAGOGASTRODUODENOSCOPY    . LEFT HEART CATH AND  CORS/GRAFTS ANGIOGRAPHY N/A 12/15/2017   Procedure: LEFT HEART CATH AND CORS/GRAFTS ANGIOGRAPHY;  Surgeon: [name], MD;  Location: [facility];  Service: Cardiovascular;  Laterality: N/A;  . LEFT HEART CATHETERIZATION WITH CORONARY/GRAFT ANGIOGRAM N/A 08/01/2012   Procedure: LEFT HEART CATHETERIZATION WITH Beatrix Fetters;  Surgeon: [name], MD;  Location: [facility] C[facility] LAB;  Service: Cardiovascular;  Laterality: N/A;  . LEFT HEART CATHETERIZATION WITH CORONARY/GRAFT ANGIOGRAM N/A 01/03/2015   Procedure: LEFT HEART CATHETERIZATION WITH Beatrix Fetters;  Surgeon: [name], MD;  Location: [facility] CATH LAB;  Service: Cardiovascular;  Laterality: N/A;  . NASAL SEPTUM SURGERY     UP3  . PERCUTANEOUS CORONARY STENT INTERVENTION (PCI-S)  10/2008   mRCA PCI  2.75 x 23 Abbott Xience V drug-eluting stent   . ULTRASOUND GUIDANCE FOR VASCULAR ACCESS  12/15/2017   Procedure: Ultrasound Guidance For Vascular Access;  Surgeon: [name], MD;  Location: [place]  CV LAB;  Service: Cardiovascular;;     Medications Prior to Admission: Prior to Admission medications   Medication Sig Start Date End Date Taking? Authorizing Provider  albuterol (VENTOLIN HFA) 108 (90 Base) MCG/ACT inhaler Inhale 2 puffs into the lungs every 6 (six) hours as needed for wheezing or shortness of breath. 06/25/19  Yes [name], MD  atorvastatin (LIPITOR) 80 MG tablet Take 1 tablet (80 mg total) by mouth daily. 02/06/19  Yes [name], MD  divalproex (DEPAKOTE ER) 250 MG 24 hr tablet Take 3 tablets (750 mg total) by mouth at bedtime. 11/07/18  Yes Ghimire, Shanker M, MD  ELIQUIS 5 MG TABS tablet TAKE 1 TABLET (5 MG TOTAL) BY MOUTH 2 (TWO) TIMES DAILY. Patient taking differently: Take 5 mg by mouth 2 (two) times daily.  11/19/19  Yes [name], MD  fenofibrate (TRICOR) 145 MG tablet Take 1 tablet (145 mg total) by mouth daily. 08/23/19  Yes [name], MD  ferrous sulfate 325 (65 FE) MG tablet Take 1 tablet (325 mg total) by mouth every other day. 05/16/19  Yes Gatha Mayer, MD  furosemide (LASIX) 20 MG tablet TAKE 1 TABLET BY MOUTH DAILY AS NEEDED FOR EDEMA Patient taking differently: Take 20 mg by mouth daily.  11/28/19  Yes [name], MD  gabapentin (NEURONTIN) 100 MG capsule Take 1 in the morning, take 2 in the evening 10/10/19  Yes Suzzanne Cloud, NP  insulin aspart (NOVOLOG FLEXPEN) 100 UNIT/ML FlexPen 3 times a day (just before each meal) 65-80-130 units 11/12/19  Yes Renato Shin, MD  insulin NPH Human (NOVOLIN N) 100 UNIT/ML injection Inject 0.8 mLs (80 Units total) into the skin at bedtime. 09/06/19  Yes Renato Shin, MD  isosorbide mononitrate (IMDUR) 60 MG 24 hr tablet TAKE TWO TABLETS BY MOUTH DAILY Patient taking differently: Take 120 mg by mouth daily.  11/28/19  Yes [name], MD  levothyroxine (SYNTHROID) 100 MCG tablet TAKE 1 TABLET (100 MCG TOTAL) BY MOUTH DAILY. 12/14/19  Yes [name], MD  metoprolol succinate (TOPROL-XL)  50 MG 24 hr tablet TAKE 1&1/2 TABLETS BY MOUTH ONCE A DAY FOR A TOTAL OF 75 MG. Patient taking differently: Take 75 mg by mouth daily.  02/16/19  Yes [name], MD  midodrine (PROAMATINE) 10 MG tablet Take 2 tablets (20 mg total) by mouth daily. 07/06/19  Yes [name], MD  nitroGLYCERIN (NITROSTAT) 0.4 MG SL tablet PLACE 1 TABLET UNDER THE TONGUE EVERY 5 MINUTES AS  NEEDED FOR CHEST PAIN. Patient taking differently: Place 0.4 mg under the tongue every 5 (five) minutes as needed for chest pain.  12/13/19  Yes [name], MD  pantoprazole (PROTONIX) 40 MG tablet TAKE 1 TABLET EVERY DAY Patient taking differently: Take 40 mg by mouth daily.  11/13/19  Yes [name], MD  ranolazine (RANEXA) 500 MG 12 hr tablet TAKE 1 TABLET (500 MG TOTAL) BY MOUTH 2 (TWO) TIMES DAILY. 12/17/19  Yes [name], MD  rOPINIRole (REQUIP) 1 MG tablet Take 1 tablet (1 mg total) by mouth at bedtime. 12/27/19  Yes [name], MD  sertraline (ZOLOFT) 100 MG tablet TAKE 1 TABLET (100 MG TOTAL) BY MOUTH 2 (TWO) TIMES DAILY. 10/23/19  Yes [name], MD  tiZANidine (ZANAFLEX) 4 MG tablet Take 1 tablet (4 mg total) by mouth every 6 (six) hours as needed for muscle spasms. 02/05/19  Yes [name], MD  Vitamin D, Ergocalciferol, (DRISDOL) 1.25 MG (50000 UT) CAPS capsule Take 1 capsule (50,000 Units total) by mouth every 7 (seven) days. 11/07/18  Yes Ghimire, Henreitta Leber, MD  Continuous Blood Gluc Sensor (FREESTYLE LIBRE 14 DAY SENSOR) MISC 1 Device by Does not apply route every 14 (fourteen) days. 09/06/19   Renato Shin, MD  diclofenac sodium (VOLTAREN) 1 % GEL Apply 2 g topically daily as needed (for pain in affected area). Patient not taking: Reported on 01/05/2020 11/07/18   Jonetta Osgood, MD  glucose blood (ACCU-CHEK AVIVA) test strip Use as directed to check blood sugar 4 times daily.  DX E11.9 02/05/19   [name], MD  Insulin Pen Needle 31G X 8 MM MISC 1 each by Does not apply  route 3 (three) times daily. E11.9 11/12/19   Renato Shin, MD  INSULIN SYRINGE 1CC/29G 29G X 1/2" 1 ML MISC 1 each by Does not apply route daily. Use to inject insulin daily 08/28/19   Renato Shin, MD  loperamide (IMODIUM A-D) 2 MG tablet Take 1 tablet (2 mg total) by mouth 4 (four) times daily as needed for diarrhea or loose stools. Patient not taking: Reported on 01/05/2020 02/13/17   Fredia Sorrow, MD     Allergies:    Allergies  Allergen Reactions  . Morphine Other (See Comments)    hallucinations  . Shellfish-Derived Products Itching    Social History:   Social History   Socioeconomic History  . Marital status: Married    Spouse name: Not on file  . Number of children: 4  . Years of education: 33  . Highest education level: Not on file  Occupational History  . Occupation: retired  Tobacco Use  . Smoking status: Never Smoker  . Smokeless tobacco: Never Used  Substance and Sexual Activity  . Alcohol use: No    Alcohol/week: 0.0 standard drinks    Comment: stopped drinking in 1998  . Drug use: No  . Sexual activity: Not on file  Other Topics Concern  . Not on file  Social History Narrative   Patient is right handed.   Patient does not drink caffeine.   Social Determinants of Health   Financial Resource Strain:   . Difficulty of Paying Living Expenses: Not on file  Food Insecurity:   . Worried About Charity fundraiser in the Last Year: Not on file  . Ran Out of Food in the Last Year: Not on file  Transportation Needs:   . Lack of Transportation (Medical): Not on file  .  Lack of Transportation (Non-Medical): Not on file  Physical Activity:   . Days of Exercise per Week: Not on file  . Minutes of Exercise per Session: Not on file  Stress:   . Feeling of Stress : Not on file  Social Connections:   . Frequency of Communication with Friends and Family: Not on file  . Frequency of Social Gatherings with Friends and Family: Not on file  . Attends Religious  Services: Not on file  . Active Member of Clubs or Organizations: Not on file  . Attends Archivist Meetings: Not on file  . Marital Status: Not on file  Intimate Partner Violence:   . Fear of Current or Ex-Partner: Not on file  . Emotionally Abused: Not on file  . Physically Abused: Not on file  . Sexually Abused: Not on file    Family History:   The patient's family history includes Cancer in his maternal grandmother; Coronary artery disease in his brother and brother; Diabetes in his brother, brother, and mother; Heart attack in an other family member; Heart failure in his father and mother; Hypothyroidism in his brother; Irregular heart beat in his daughter; Other in his brother; Pancreatic cancer in his brother; Stroke in his brother. There is no history of Colon cancer, Stomach cancer, Liver cancer, Rectal cancer, or Esophageal cancer.    ROS:  Please see the history of present illness.  All other ROS reviewed and negative.     Physical Exam/Data:   Vitals:   01/05/20 1500 01/05/20 1515 01/05/20 1530 01/05/20 1545  BP: (!) 137/59 116/62 132/61 127/61  Pulse: (!) 52 (!) 52 (!) 54 (!) 51  Resp: 19 16 18 16   Temp:      TempSrc:      SpO2: 97% 98% 98% 97%   No intake or output data in the 24 hours ending 01/05/20 1618 Last 3 Weights 12/27/2019 11/07/2019 10/10/2019  Weight (lbs) 218 lb 12.8 oz 214 lb 215 lb 9.6 oz  Weight (kg) 99.247 kg 97.07 kg 97.796 kg     There is no height or weight on file to calculate BMI.  General:  Well nourished, well developed, in no acute distress HEENT: normal Lymph: no adenopathy Neck: no JVD Endocrine:  No thryomegaly Vascular: No carotid bruits; FA pulses 2+ bilaterally without bruits  Cardiac:  normal S1, S2; RRR; no murmur  Lungs:  clear to auscultation bilaterally, no wheezing, rhonchi or rales  Abd: soft, nontender, no hepatomegaly  Ext: no edema Musculoskeletal:  No deformities, BUE and BLE strength normal and equal Skin:  warm and dry  Neuro:  CNs 2-12 intact, no focal abnormalities noted Psych:  Normal affect    EKG:  The ECG that was done and was personally reviewed and demonstrates sinus bradycardia with RBBB  Relevant CV Studies:  Cath 12/15/2017  Ost 1st Diag to 1st Diag lesion is 95% stenosed.  Prox LAD to Mid LAD lesion is 90% stenosed.  Ost 2nd Diag to 2nd Diag lesion is 100% stenosed.  Prox Cx lesion is 20% stenosed.  Mid Cx lesion is 30% stenosed.  Mid RCA to Dist RCA lesion is 5% stenosed.  LIMA and is normal in caliber.  And is normal in caliber.  The flow in the graft is reversed.  There is competitive flow.  And is normal in caliber.  And is normal in caliber.  Post Atrio lesion is 100% stenosed.  Mid LAD lesion is 100% stenosed.  The left ventricular systolic  function is normal.  LV end diastolic pressure is normal.  The left ventricular ejection fraction is 55-65% by visual estimate.  There is no aortic valve stenosis.   Severe 2 vessel CAD.  All 4 bypass grafts are patent.   Continue medical therapy.    Echo 08/29/2019 IMPRESSIONS  1. Left ventricular ejection fraction, by visual estimation, is 60 to  65%. The left ventricle has normal function. Normal left ventricular size.  There is no left ventricular hypertrophy.  2. Left ventricular diastolic Doppler parameters are consistent with  pseudonormalization pattern of LV diastolic filling.  3. Global right ventricle has normal systolic function.The right  ventricular size is normal. No increase in right ventricular wall  thickness.  4. Left atrial size was normal.  5. Right atrial size was normal.  6. The mitral valve is normal in structure. No evidence of mitral valve  regurgitation. No evidence of mitral stenosis.  7. The tricuspid valve is normal in structure. Tricuspid valve  regurgitation is mild.  8. The aortic valve is normal in structure. Aortic valve regurgitation  was not visualized  by color flow Doppler. Structurally normal aortic  valve, with no evidence of sclerosis or stenosis.  9. The pulmonic valve was normal in structure. Pulmonic valve  regurgitation is not visualized by color flow Doppler.  10. Normal pulmonary artery systolic pressure.  11. The inferior vena cava is normal in size with greater than 50%  respiratory variability, suggesting right atrial pressure of 3 mmHg.      Laboratory Data:  High Sensitivity Troponin:   Recent Labs  Lab 01/05/20 1234  TROPONINIHS 4      Chemistry Recent Labs  Lab 01/05/20 1234  NA 140  K 4.7  CL 107  CO2 24  GLUCOSE 253*  BUN 25*  CREATININE 2.13*  CALCIUM 9.5  GFRNONAA 30*  GFRAA 35*  ANIONGAP 9    Recent Labs  Lab 01/05/20 1234  PROT 7.1  ALBUMIN 4.1  AST 30  ALT 31  ALKPHOS 48  BILITOT 1.1   Hematology Recent Labs  Lab 01/05/20 1234  WBC 7.9  RBC 4.23  HGB 13.3  HCT 39.4  MCV 93.1  MCH 31.4  MCHC 33.8  RDW 14.1  PLT 232   BNPNo results for input(s): BNP, PROBNP in the last 168 hours.  DDimer No results for input(s): DDIMER in the last 168 hours.   Radiology/Studies:  DG Chest Portable 1 View  Result Date: 01/05/2020 CLINICAL DATA:  Left-sided chest pain.  Shortness of breath. EXAM: PORTABLE CHEST 1 VIEW COMPARISON:  August 20, 2019 FINDINGS: Stable cardiomegaly. The hila and mediastinum are normal. No pneumothorax. No nodules or masses. No focal infiltrates. IMPRESSION: No active disease. Electronically Signed   By: Dorise Bullion III M.D   On: 01/05/2020 13:10       TIMI Risk Score for Unstable Angina or Non-ST Elevation MI:   The patient's TIMI risk score is 4, which indicates a 20% risk of all cause mortality, new or recurrent myocardial infarction or need for urgent revascularization in the next 14 days.   Assessment and Plan:   1. Unstable angina: Although symptoms are consistent with angina, however despite prolonged episode, high-sensitivity troponin was  negative.  Chest pain has been intermittent for the past month, initially occurs with physical exertion, now occurs even at rest.  Will discuss with MD, his worsening renal function make him less ideal for cardiac catheterization unless significant improvement in renal function.  He  does not have a nephrologist.  Recommend renal Doppler. Will proceed with aggressive hydration, hold lasix. If renal function improve, plan for LHC on Monday.  - Risk and benefit of procedure explained to the patient who display clear understanding and agree to proceed.  Discussed with patient possible procedural risk include bleeding, vascular injury, renal injury, arrythmia, MI, stroke and loss of limb or life.  2. CAD s/p CABG: Last cardiac catheterization in 2019 showed stable anatomy.  Not on aspirin given the need for Eliquis.  3. PAF: Currently maintaining sinus rhythm on Eliquis and metoprolol.  4. CKD stage III-IV: Worsening renal function recently.  Despite his PCPs instruction to drink more fluid, his renal function is actually getting worse.  5. Hypertension: Blood pressure stable  6. Hyperlipidemia: On Lipitor 80 mg daily  7. DM2  8. Chronic diastolic heart failure: Euvolemic on physical exam  Severity of Illness: The appropriate patient status for this patient is OBSERVATION. Observation status is judged to be reasonable and necessary in order to provide the required intensity of service to ensure the patient's safety. The patient's presenting symptoms, physical exam findings, and initial radiographic and laboratory data in the context of their medical condition is felt to place them at decreased risk for further clinical deterioration. Furthermore, it is anticipated that the patient will be medically stable for discharge from the hospital within 2 midnights of admission. The following factors support the patient status of observation.   " The patient's presenting symptoms include chest pain. " The  physical exam findings include benign. " The initial radiographic and laboratory data are normal.     For questions or updates, please contact [place] Please consult [email or web address] for contact info under        Signed, Almyra Deforest, [place]  01/05/2020 4:18 PM   Personally seen and examined. Agree with above.   74 year old male with CAD status post CABG, chronic kidney disease stage III, prior history of noncompliance diabetes with hypertension here with worsening anginal symptoms substernal chest discomfort with radiation down left arm escalating, recurrent.  Thankfully troponin high-sensitivity has been normal.  Last cardiac catheterization reviewed personally 2019 showed severe two-vessel CAD with all 4 bypasses open.  Medical therapy ensued.  Normal EF.  GEN: Well nourished, well developed, in no acute distress  HEENT: normal  Neck: no JVD, carotid bruits, or masses Cardiac: RRR; no murmurs, rubs, or gallops,no edema  Respiratory:  clear to auscultation bilaterally, normal work of breathing GI: soft, nontender, nondistended, + BS MS: no deformity or atrophy  Skin: warm and dry, no rash Neuro:  Alert and Oriented x 3, Strength and sensation are intact Psych: euthymic mood, full affect  Creatinine 2.1  Assessment and plan:  Unstable angina -Escalating symptoms, worsening discomfort.  We will go ahead and prepare him for cardiac catheterization on Monday.  Has prior shellfish allergy, no anaphylaxis, does not require prophylaxis.  We will however start IV fluids to hopefully improve renal function/renal protection.  Risks and benefits of procedure explained, he is willing to proceed.  Chronic kidney disease stage III -Worsening creatinine over the past several months.  Primary care physician has recently instructed him to increase his fluid intake.  He has been however taking Lasix at home as well.  Perhaps this is prerenal.  IV fluids.  Protection.  If renal function for some  reason does not improve, we discussed the possibility of nuclear stress test.  He states that every single stress test that he is  ever taken was normal.  This includes the 1 prior to his bypass approximately 1 month.   Diabetes with hypertension and hyperlipidemia -Continue with atorvastatin 80 mg high intensity with goal LDL less than 70.  Blood pressure is currently well controlled on medications as above.  Coronary artery disease status post CABG -2019-stable anatomy, all bypasses were open.  Paroxysmal atrial fibrillation -We will hold his Eliquis.  Continue metoprolol.   Candee Furbish, MD

## 2020-01-05 NOTE — ED Provider Notes (Signed)
[facility] EMERGENCY DEPARTMENT Provider Note   CSN: [id number] Arrival date & time: 01/05/20  1214     History Chief Complaint  Patient presents with  . Chest Pain    [name] [name] is a 74 y.o. male.  Patient has a history of coronary disease with bypass surgery and multiple stents.  He states for the last month he has been having chest discomfort that exertion.  Last night he started having pain with rest.  It has been going on for 9 hours.  The history is provided by the patient. No language interpreter was used.  Chest Pain Pain location:  Substernal area and L chest Pain quality: aching   Pain radiates to:  Does not radiate Pain severity:  Moderate Onset quality:  Sudden Timing:  Constant Progression:  Worsening Chronicity:  Recurrent Context: not breathing   Associated symptoms: no abdominal pain, no back pain, no cough, no fatigue and no headache        Past Medical History:  Diagnosis Date  . Acquired atrophy of thyroid 06/05/2013   Overview:  July 2014: controlled on synthroid 61mcg since 2012 May 2015: decreased to Synthroid 50 mcg  Last Assessment & Plan:  Pt doing well with most recent TSH within normal range.  Will continue current dosage of Synthroid 41mcg. Pt reminded to take medication on empty stomach. Will continue to monitor with periodic laboratory assessment in 3 months.   . Anemia    hemoglobin 7.4, iron deficiency, January, 2011, 2 unit transfusion, endoscopy normal, capsule endoscopy February, 2011 no small bowel abnormalities.   Most likely source gastric erosions, followed by GI  . Anxiety   . Bradycardia   . CAD (coronary artery disease)    A. CABG in 2000,status post cardiac cath in 2006, 2009 ....continued chest pain and SOB despite oral medication adjestments including Ranexa. B. Cath November 2009/ mRCA - 2.75 x 23 Abbott Xience V drug-eluting stent ...11/26/2008 to distal  RCA leading to acute marginal.  C. Cath 07/2012  for CP - stable anatomy, med rx. d. cath 2015 and 05/30/2015 stable anatomy, consider Myoview if has CP again  . Carotid artery disease ([place]) 08/01/2015   Doppler, May 29, 2015, 1-39% bilateral ICA   . Cerebral ischemia    MRI November, 2010, chronic microvascular ischemia  . CKD (chronic kidney disease), stage III   . Concussion   . Depression    Bipolar  . Diabetes mellitus ([place])   . Edema   . Essential hypertension 06/05/2013   Overview:  July 2014: Controlled with Bystolic Sep 7564: Changed to atenolol.  Last Assessment & Plan:  Will change to atenolol for cost and follow Improved.  Medication compliance strongly encouraged BP: 116/64 mmHg    Overview:  July 2014: Controlled with Bystolic Sep 3329: Changed to atenolol.  Last Assessment & Plan:  Will change to atenolol for cost and follow  . Falling episodes    these have occurred in the past and again recurring 2011  . Family history of adverse reaction to anesthesia    "mother died during bypass surgery but not sure if it has to do with anesthesia"  . Gastric ulcer   . GERD (gastroesophageal reflux disease)   . H/O medication noncompliance    Due to loss of insurance  . H/O multiple concussions   . Hard of hearing   . History of blood transfusion 12/20/2013  . Hx of CABG    2000,  / one  median sternotomy suture broken her chest x-ray November, 2010, no clinical significance  . Hyperlipidemia   . Hypertension    pt. denies  . Iron deficiency anemia   . Long term (current) use of anticoagulants [Z79.01] 07/20/2016  . Low back pain 06/12/2009   Qualifier: Diagnosis of  By: [name]   . Nephrolithiasis   . Orthostasis   . OSA (obstructive sleep apnea)   . PAF (paroxysmal atrial fibrillation) ([place])    a. dx 2017, started on amiodarone/warfarin but patient intermittently noncompliant.  Marland Kitchen PSVT (paroxysmal supraventricular tachycardia) ([place])   . RBBB 07/09/2009   Qualifier: Diagnosis of  By: [name], MD, [name] [name]   . RLS  (restless legs syndrome) 09/19/2009   Qualifier: Diagnosis of  By: [name]   Overview:  July 2014: Controlled with Mirapex  Last Assessment & Plan:  Patient is doing well. Will continue current management and follow clinically.  . Seizure disorder ([place]) 01/09/2017  . Spondylosis    C5-6, C6-7 MRI 2010  . Syncope 03/2016  . Thyroid disease   . Tubulovillous adenoma of colon 2007  . Vitamin D deficiency 05/17/2017  . Wears glasses     Patient Active Problem List   Diagnosis Date Noted  . Left ankle pain 12/30/2019  . Frequency of urination 05/13/2019  . Melena 04/09/2019  . Obesity 02/05/2019  . Neck pain 02/05/2019  . MCI (mild cognitive impairment) 02/05/2019  . Fall at home, initial encounter 11/06/2018  . CKD (chronic kidney disease), stage III 11/06/2018  . Localized swelling of both lower extremities 07/13/2018  . Cervical radiculopathy at C8 02/08/2018  . Hypersomnolence 10/18/2017  . Vitamin D deficiency 05/17/2017  . Seizure disorder ([place]) 01/09/2017  . Atrial fibrillation ([place]) [I48.91] 07/20/2016  . Long term (current) use of anticoagulants [Z79.01] 07/20/2016  . Foraminal stenosis of cervical region 05/31/2016  . DOE (dyspnea on exertion) 05/14/2016  . Chronic diastolic CHF (congestive heart failure) ([place]) 03/13/2016  . Headache 01/25/2016  . Carotid artery disease ([place]) 08/01/2015  . Spinal stenosis in cervical region 07/30/2015  . Occipital neuralgia of right side 07/15/2015  . Cervicogenic headache 07/15/2015  . Dizziness   . Chest pain 05/29/2015  . Medicare annual wellness visit, initial 05/11/2015  . Sun-damaged skin 05/11/2015  . Advance care planning 05/01/2015  . Post-traumatic headache 02/18/2015  . Pain in both knees 01/07/2015  . Recurrent pain of right knee 05/10/2014  . Personal history of ongoing treatment with alendronate (Fosamax) 12/20/2013  . History of blood transfusion 12/20/2013  . Hyperlipidemia LDL goal <70 06/27/2013  . HLD  (hyperlipidemia) 06/27/2013  . Essential hypertension 06/05/2013  . Arthritis of hand, degenerative 06/05/2013  . Acquired atrophy of thyroid 06/05/2013  . Hypothyroidism due to acquired atrophy of thyroid 06/05/2013  . Osteoarthritis of hand 06/05/2013  . Arteriosclerosis of coronary artery 05/01/2013  . Benign prostatic hyperplasia with urinary obstruction 05/01/2013  . BPH with obstruction/lower urinary tract symptoms 05/01/2013  . CAD (coronary artery disease) 05/01/2013  . Abdominal pain, unspecified site 03/06/2013  . Right shoulder pain 10/24/2012  . Eczema 10/24/2012  . Bilateral groin pain 09/21/2012  . Ejection fraction   . Diabetes mellitus type II, uncontrolled ([place]) 08/31/2011  . Coronary artery disease involving native coronary artery   . Anemia   . OSA (obstructive sleep apnea)   . Edema   . Falling episodes   . Hx of CABG   . Palpitations   . Shortness of  breath   . Insomnia 04/26/2011  . FATIGUE 12/10/2010  . RLS (restless legs syndrome) 09/19/2009  . Depression 07/09/2009  . RBBB 07/09/2009  . Low back pain 06/12/2009  . SCIATICA 01/17/2009  . GERD (gastroesophageal reflux disease) 01/09/2009  . TUBULOVILLOUS ADENOMA, COLON, HX OF 01/08/2009  . Presence of drug coated stent in right coronary artery 10/29/2008  . NEPHROLITHIASIS, HX OF 12/20/2007    Past Surgical History:  Procedure Laterality Date  . ANTERIOR CERVICAL DECOMP/DISCECTOMY FUSION N/A 05/31/2016   Procedure: ANTERIOR CERVICAL DECOMPRESSION/DISCECTOMY FUSION CERVICAL FIVE-SIX,CERVICAL SIX-SEVEN;  Surgeon: [name], MD;  Location: [facility];  Service: Neurosurgery;  Laterality: N/A;  . CARDIAC CATHETERIZATION N/A 05/30/2015   Procedure: Left Heart Cath and Coronary Angiography;  Surgeon: [name], MD;  Location: [facility];  Service: Cardiovascular;  Laterality: N/A;  . COLONOSCOPY    . CORONARY ARTERY BYPASS GRAFT     2000  . ESOPHAGOGASTRODUODENOSCOPY    . LEFT HEART CATH  AND CORS/GRAFTS ANGIOGRAPHY N/A 12/15/2017   Procedure: LEFT HEART CATH AND CORS/GRAFTS ANGIOGRAPHY;  Surgeon: [name], MD;  Location: [facility] CV LAB;  Service: Cardiovascular;  Laterality: N/A;  . LEFT HEART CATHETERIZATION WITH CORONARY/GRAFT ANGIOGRAM N/A 08/01/2012   Procedure: LEFT HEART CATHETERIZATION WITH Beatrix Fetters;  Surgeon: [name], MD;  Location: [facility] CATH LAB;  Service: Cardiovascular;  Laterality: N/A;  . LEFT HEART CATHETERIZATION WITH CORONARY/GRAFT ANGIOGRAM N/A 01/03/2015   Procedure: LEFT HEART CATHETERIZATION WITH Beatrix Fetters;  Surgeon: [name], MD;  Location: [facility] CATH LAB;  Service: Cardiovascular;  Laterality: N/A;  . NASAL SEPTUM SURGERY     UP3  . PERCUTANEOUS CORONARY STENT INTERVENTION (PCI-S)  10/2008   mRCA PCI  2.75 x 23 Abbott Xience V drug-eluting stent   . ULTRASOUND GUIDANCE FOR VASCULAR ACCESS  12/15/2017   Procedure: Ultrasound Guidance For Vascular Access;  Surgeon: [name], MD;  Location: [facility] LAB;  Service: Cardiovascular;;       Family History  Problem Relation Age of Onset  . Pancreatic cancer Brother   . Diabetes Brother   . Coronary artery disease Brother   . Stroke Brother   . Diabetes Brother   . Diabetes Mother   . Heart failure Mother   . Heart failure Father   . Hypothyroidism Brother   . Coronary artery disease Brother   . Other Brother        colon surgery  . Heart attack Other        Nephew  . Irregular heart beat Daughter   . Cancer Maternal Grandmother        unknown   . Colon cancer Neg Hx   . Stomach cancer Neg Hx   . Liver cancer Neg Hx   . Rectal cancer Neg Hx   . Esophageal cancer Neg Hx     Social History   Tobacco Use  . Smoking status: Never Smoker  . Smokeless tobacco: Never Used  Substance Use Topics  . Alcohol use: No    Alcohol/week: 0.0 standard drinks    Comment: stopped drinking in 1998  . Drug use: No    Home  Medications Prior to Admission medications   Medication Sig Start Date End Date Taking? Authorizing Provider  albuterol (VENTOLIN HFA) 108 (90 Base) MCG/ACT inhaler Inhale 2 puffs into the lungs every 6 (six) hours as needed for wheezing or shortness of breath. 06/25/19  Yes [name], MD  atorvastatin (LIPITOR) 80 MG tablet Take 1 tablet (80 mg total) by mouth daily. 02/06/19  Yes [name], MD  divalproex (DEPAKOTE ER) 250 MG 24 hr tablet Take 3 tablets (750 mg total) by mouth at bedtime. 11/07/18  Yes Ghimire, Shanker M, MD  ELIQUIS 5 MG TABS tablet TAKE 1 TABLET (5 MG TOTAL) BY MOUTH 2 (TWO) TIMES DAILY. Patient taking differently: Take 5 mg by mouth 2 (two) times daily.  11/19/19  Yes [name], MD  fenofibrate (TRICOR) 145 MG tablet Take 1 tablet (145 mg total) by mouth daily. 08/23/19  Yes [name], MD  ferrous sulfate 325 (65 FE) MG tablet Take 1 tablet (325 mg total) by mouth every other day. 05/16/19  Yes Gatha Mayer, MD  furosemide (LASIX) 20 MG tablet TAKE 1 TABLET BY MOUTH DAILY AS NEEDED FOR EDEMA Patient taking differently: Take 20 mg by mouth daily.  11/28/19  Yes [name], MD  gabapentin (NEURONTIN) 100 MG capsule Take 1 in the morning, take 2 in the evening 10/10/19  Yes Suzzanne Cloud, NP  insulin aspart (NOVOLOG FLEXPEN) 100 UNIT/ML FlexPen 3 times a day (just before each meal) 65-80-130 units 11/12/19  Yes Renato Shin, MD  insulin NPH Human (NOVOLIN N) 100 UNIT/ML injection Inject 0.8 mLs (80 Units total) into the skin at bedtime. 09/06/19  Yes Renato Shin, MD  isosorbide mononitrate (IMDUR) 60 MG 24 hr tablet TAKE TWO TABLETS BY MOUTH DAILY Patient taking differently: Take 120 mg by mouth daily.  11/28/19  Yes [name], MD  levothyroxine (SYNTHROID) 100 MCG tablet TAKE 1 TABLET (100 MCG TOTAL) BY MOUTH DAILY. 12/14/19  Yes [name], MD  metoprolol succinate (TOPROL-XL) 50 MG 24 hr tablet TAKE 1&1/2 TABLETS BY MOUTH ONCE A DAY FOR  A TOTAL OF 75 MG. Patient taking differently: Take 75 mg by mouth daily.  02/16/19  Yes [name], MD  midodrine (PROAMATINE) 10 MG tablet Take 2 tablets (20 mg total) by mouth daily. 07/06/19  Yes [name], MD  nitroGLYCERIN (NITROSTAT) 0.4 MG SL tablet PLACE 1 TABLET UNDER THE TONGUE EVERY 5 MINUTES AS NEEDED FOR CHEST PAIN. Patient taking differently: Place 0.4 mg under the tongue every 5 (five) minutes as needed for chest pain.  12/13/19  Yes [name], MD  pantoprazole (PROTONIX) 40 MG tablet TAKE 1 TABLET EVERY DAY Patient taking differently: Take 40 mg by mouth daily.  11/13/19  Yes [name], MD  ranolazine (RANEXA) 500 MG 12 hr tablet TAKE 1 TABLET (500 MG TOTAL) BY MOUTH 2 (TWO) TIMES DAILY. 12/17/19  Yes [name], MD  rOPINIRole (REQUIP) 1 MG tablet Take 1 tablet (1 mg total) by mouth at bedtime. 12/27/19  Yes [name], MD  sertraline (ZOLOFT) 100 MG tablet TAKE 1 TABLET (100 MG TOTAL) BY MOUTH 2 (TWO) TIMES DAILY. 10/23/19  Yes [name], MD  tiZANidine (ZANAFLEX) 4 MG tablet Take 1 tablet (4 mg total) by mouth every 6 (six) hours as needed for muscle spasms. 02/05/19  Yes [name], MD  Vitamin D, Ergocalciferol, (DRISDOL) 1.25 MG (50000 UT) CAPS capsule Take 1 capsule (50,000 Units total) by mouth every 7 (seven) days. 11/07/18  Yes Ghimire, Henreitta Leber, MD  Continuous Blood Gluc Sensor (FREESTYLE LIBRE 14 DAY SENSOR) MISC 1 Device by Does not apply route every 14 (fourteen) days. 09/06/19   Renato Shin, MD  diclofenac sodium (VOLTAREN) 1 %  GEL Apply 2 g topically daily as needed (for pain in affected area). Patient not taking: Reported on 01/05/2020 11/07/18   Jonetta Osgood, MD  glucose blood (ACCU-CHEK AVIVA) test strip Use as directed to check blood sugar 4 times daily.  DX E11.9 02/05/19   [name], MD  Insulin Pen Needle 31G X 8 MM MISC 1 each by Does not apply route 3 (three) times daily. E11.9 11/12/19   Renato Shin, MD    INSULIN SYRINGE 1CC/29G 29G X 1/2" 1 ML MISC 1 each by Does not apply route daily. Use to inject insulin daily 08/28/19   Renato Shin, MD  loperamide (IMODIUM A-D) 2 MG tablet Take 1 tablet (2 mg total) by mouth 4 (four) times daily as needed for diarrhea or loose stools. Patient not taking: Reported on 01/05/2020 02/13/17   Fredia Sorrow, MD    Allergies    Morphine and Shellfish-derived products  Review of Systems   Review of Systems  Constitutional: Negative for appetite change and fatigue.  HENT: Negative for congestion, ear discharge and sinus pressure.   Eyes: Negative for discharge.  Respiratory: Negative for cough.   Cardiovascular: Positive for chest pain.  Gastrointestinal: Negative for abdominal pain and diarrhea.  Genitourinary: Negative for frequency and hematuria.  Musculoskeletal: Negative for back pain.  Skin: Negative for rash.  Neurological: Negative for seizures and headaches.  Psychiatric/Behavioral: Negative for hallucinations.    Physical Exam Updated Vital Signs BP (!) 126/57   Pulse (!) 55   Temp (!) 97.5 F (36.4 C) (Oral)   Resp 16   SpO2 96%   Physical Exam Vitals and nursing note reviewed.  Constitutional:      Appearance: He is well-developed.  HENT:     Head: Normocephalic.     Nose: Nose normal.  Eyes:     General: No scleral icterus.    Conjunctiva/sclera: Conjunctivae normal.  Neck:     Thyroid: No thyromegaly.  Cardiovascular:     Rate and Rhythm: Normal rate and regular rhythm.     Heart sounds: No murmur. No friction rub. No gallop.   Pulmonary:     Breath sounds: No stridor. No wheezing or rales.  Chest:     Chest wall: No tenderness.  Abdominal:     General: There is no distension.     Tenderness: There is no abdominal tenderness. There is no rebound.  Musculoskeletal:        General: Normal range of motion.     Cervical back: Neck supple.  Lymphadenopathy:     Cervical: No cervical adenopathy.  Skin:    Findings:  No erythema or rash.  Neurological:     Mental Status: He is oriented to person, place, and time.     Motor: No abnormal muscle tone.     Coordination: Coordination normal.  Psychiatric:        Behavior: Behavior normal.     ED Results / Procedures / Treatments   Labs (all labs ordered are listed, but only abnormal results are displayed) Labs Reviewed  BASIC METABOLIC PANEL - Abnormal; Notable for the following components:      Result Value   Glucose, Bld 253 (*)    BUN 25 (*)    Creatinine, Ser 2.13 (*)    GFR calc non Af Amer 30 (*)    GFR calc Af Amer 35 (*)    All other components within normal limits  HEPATIC FUNCTION PANEL - Abnormal; Notable for the  following components:   Indirect Bilirubin 1.0 (*)    All other components within normal limits  RESPIRATORY PANEL BY RT PCR (FLU A&B, COVID)  CBC  TROPONIN I (HIGH SENSITIVITY)  TROPONIN I (HIGH SENSITIVITY)    EKG EKG Interpretation  Date/Time:  Saturday January 05 2020 12:25:47 EST Ventricular Rate:  56 PR Interval:    QRS Duration: 131 QT Interval:  464 QTC Calculation: 448 R Axis:   38 Text Interpretation: Sinus rhythm Borderline prolonged PR interval Right bundle branch block Confirmed by Milton Ferguson [phone]) on 01/05/2020 12:29:35 PM   Radiology DG Chest Portable 1 View  Result Date: 01/05/2020 CLINICAL DATA:  Left-sided chest pain.  Shortness of breath. EXAM: PORTABLE CHEST 1 VIEW COMPARISON:  August 20, 2019 FINDINGS: Stable cardiomegaly. The hila and mediastinum are normal. No pneumothorax. No nodules or masses. No focal infiltrates. IMPRESSION: No active disease. Electronically Signed   By: Dorise Bullion III M.D   On: 01/05/2020 13:10    Procedures Procedures (including critical care time)  Medications Ordered in ED Medications  nitroGLYCERIN (NITROSTAT) SL tablet 0.4 mg (0.4 mg Sublingual Given 01/05/20 1305)  sodium chloride flush (NS) 0.9 % injection 3 mL (3 mLs Intravenous Given 01/05/20  1237)  HYDROmorphone (DILAUDID) injection 0.5 mg (0.5 mg Intravenous Given 01/05/20 1306)    ED Course  I have reviewed the triage vital signs and the nursing notes.  Pertinent labs & imaging results that were available during my care of the patient were reviewed by me and considered in my medical decision making (see chart for details).    CRITICAL CARE Performed by: Milton Ferguson Total critical care time: 45 minutes Critical care time was exclusive of separately billable procedures and treating other patients. Critical care was necessary to treat or prevent imminent or life-threatening deterioration. Critical care was time spent personally by me on the following activities: development of treatment plan with patient and/or surrogate as well as nursing, discussions with consultants, evaluation of patient's response to treatment, examination of patient, obtaining history from patient or surrogate, ordering and performing treatments and interventions, ordering and review of laboratory studies, ordering and review of radiographic studies, pulse oximetry and re-evaluation of patient's condition.  MDM Rules/Calculators/A&P                      Troponin initially was normal.  I spoke with cardiology and they will admit the patient for catheterization next week.  Diagnosis unstable angina Final Clinical Impression(s) / ED Diagnoses Final diagnoses:  Unstable angina pectoris [facility])    Rx / DC Orders ED Discharge Orders    None       Milton Ferguson, MD 01/05/20 1451

## 2020-01-05 NOTE — ED Notes (Signed)
Pt denied the need for this RN to call his family to give an update.

## 2020-01-05 NOTE — ED Triage Notes (Signed)
Pt brought from upstairs as a visitor.  C/o L sided chest pain that radiates to L arm, SOB with exertion, and nausea x 2 days.  States wife is currently in hospital after MI.

## 2020-01-05 NOTE — ED Notes (Signed)
ED TO INPATIENT HANDOFF REPORT  ED Nurse Name and Phone #: [phone]  S Name/Age/Gender [name] [name] 74 y.o. male Room/Bed: 042C/042C  Code Status   Code Status: Prior  Home/SNF/Other Home Patient oriented to: self, place, time and situation Is this baseline? Yes   Triage Complete: Triage complete  Chief Complaint Unstable angina ([place]) [I20.0]  Triage Note Pt brought from upstairs as a visitor.  C/o L sided chest pain that radiates to L arm, SOB with exertion, and nausea x 2 days.  States wife is currently in hospital after MI.    Allergies Allergies  Allergen Reactions  . Morphine Other (See Comments)    hallucinations  . Shellfish-Derived Products Itching    Level of Care/Admitting Diagnosis ED Disposition    ED Disposition Condition [facility] [facility] Area: [place] [[id number]]  Level of Care: Telemetry Cardiac [[id number]]  Covid Evaluation: Asymptomatic Screening Protocol (No Symptoms)  Diagnosis: Unstable angina [facility]) [[id number]]  Admitting Physician: [place], [place]  Attending Physician: Jerline Pain [[id number]]  Estimated length of stay: past midnight tomorrow  Certification:: I certify this patient will need inpatient services for at least 2 midnights       B Medical/Surgery History Past Medical History:  Diagnosis Date  . Acquired atrophy of thyroid 06/05/2013   Overview:  July 2014: controlled on synthroid 23mcg since 2012 May 2015: decreased to Synthroid 50 mcg  Last Assessment & Plan:  Pt doing well with most recent TSH within normal range.  Will continue current dosage of Synthroid 42mcg. Pt reminded to take medication on empty stomach. Will continue to monitor with periodic laboratory assessment in 3 months.   . Anemia    hemoglobin 7.4, iron deficiency, January, 2011, 2 unit transfusion, endoscopy normal, capsule endoscopy February, 2011 no small bowel abnormalities.   Most likely source gastric erosions, followed by GI  .  Anxiety   . Bradycardia   . CAD (coronary artery disease)    A. CABG in 2000,status post cardiac cath in 2006, 2009 ....continued chest pain and SOB despite oral medication adjestments including Ranexa. B. Cath November 2009/ mRCA - 2.75 x 23 Abbott Xience V drug-eluting stent ...11/26/2008 to distal  RCA leading to acute marginal.  C. Cath 07/2012 for CP - stable anatomy, med rx. d. cath 2015 and 05/30/2015 stable anatomy, consider Myoview if has CP again  . Carotid artery disease ([place]) 08/01/2015   Doppler, May 29, 2015, 1-39% bilateral ICA   . Cerebral ischemia    MRI November, 2010, chronic microvascular ischemia  . CKD (chronic kidney disease), stage III   . Concussion   . Depression    Bipolar  . Diabetes mellitus ([place])   . Edema   . Essential hypertension 06/05/2013   Overview:  July 2014: Controlled with Bystolic Sep 1779: Changed to atenolol.  Last Assessment & Plan:  Will change to atenolol for cost and follow Improved.  Medication compliance strongly encouraged BP: 116/64 mmHg    Overview:  July 2014: Controlled with Bystolic Sep 3903: Changed to atenolol.  Last Assessment & Plan:  Will change to atenolol for cost and follow  . Falling episodes    these have occurred in the past and again recurring 2011  . Family history of adverse reaction to anesthesia    "mother died during bypass surgery but not sure if it has to do with anesthesia"  . Gastric ulcer   . GERD (gastroesophageal reflux disease)   .  H/O medication noncompliance    Due to loss of insurance  . H/O multiple concussions   . Hard of hearing   . History of blood transfusion 12/20/2013  . Hx of CABG    2000,  / one median sternotomy suture broken her chest x-ray November, 2010, no clinical significance  . Hyperlipidemia   . Hypertension    pt. denies  . Iron deficiency anemia   . Long term (current) use of anticoagulants [Z79.01] 07/20/2016  . Low back pain 06/12/2009   Qualifier: Diagnosis of  By: [name]    . Nephrolithiasis   . Orthostasis   . OSA (obstructive sleep apnea)   . PAF (paroxysmal atrial fibrillation) ([place])    a. dx 2017, started on amiodarone/warfarin but patient intermittently noncompliant.  Marland Kitchen PSVT (paroxysmal supraventricular tachycardia) ([place])   . RBBB 07/09/2009   Qualifier: Diagnosis of  By: [name], MD, [name] [name]   . RLS (restless legs syndrome) 09/19/2009   Qualifier: Diagnosis of  By: [name]   Overview:  July 2014: Controlled with Mirapex  Last Assessment & Plan:  Patient is doing well. Will continue current management and follow clinically.  . Seizure disorder ([place]) 01/09/2017  . Spondylosis    C5-6, C6-7 MRI 2010  . Syncope 03/2016  . Thyroid disease   . Tubulovillous adenoma of colon 2007  . Vitamin D deficiency 05/17/2017  . Wears glasses    Past Surgical History:  Procedure Laterality Date  . ANTERIOR CERVICAL DECOMP/DISCECTOMY FUSION N/A 05/31/2016   Procedure: ANTERIOR CERVICAL DECOMPRESSION/DISCECTOMY FUSION CERVICAL FIVE-SIX,CERVICAL SIX-SEVEN;  Surgeon: [name], MD;  Location: [place] NEURO ORS;  Service: Neurosurgery;  Laterality: N/A;  . CARDIAC CATHETERIZATION N/A 05/30/2015   Procedure: Left Heart Cath and Coronary Angiography;  Surgeon: [name], MD;  Location: [place] CV LAB;  Service: Cardiovascular;  Laterality: N/A;  . COLONOSCOPY    . CORONARY ARTERY BYPASS GRAFT     2000  . ESOPHAGOGASTRODUODENOSCOPY    . LEFT HEART CATH AND CORS/GRAFTS ANGIOGRAPHY N/A 12/15/2017   Procedure: LEFT HEART CATH AND CORS/GRAFTS ANGIOGRAPHY;  Surgeon: [name], MD;  Location: [place] CV LAB;  Service: Cardiovascular;  Laterality: N/A;  . LEFT HEART CATHETERIZATION WITH CORONARY/GRAFT ANGIOGRAM N/A 08/01/2012   Procedure: LEFT HEART CATHETERIZATION WITH Beatrix Fetters;  Surgeon: [name], MD;  Location[facility];  Service: Cardiovascular;  Laterality: N/A;  . LEFT HEART CATHETERIZATION WITH CORONARY/GRAFT  ANGIOGRAM N/A 01/03/2015   Procedure: LEFT HEART CATHETERIZATION WITH Beatrix Fetters;  Surgeon: [name], MD;  Location: [place] [facility] CATH LAB;  Service: Cardiovascular;  Laterality: N/A;  . NASAL SEPTUM SURGERY     UP3  . PERCUTANEOUS CORONARY STENT INTERVENTION (PCI-S)  10/2008   mRCA PCI  2.75 x 23 Abbott Xience V drug-eluting stent   . ULTRASOUND GUIDANCE FOR VASCULAR ACCESS  12/15/2017   Procedure: Ultrasound Guidance For Vascular Access;  Surgeon: [name], MD;  Location: [place] CV LAB;  Service: Cardiovascular;;     A IV Location/Drains/Wounds Patient Lines/Drains/Airways Status   Active Line/Drains/Airways    Name:   Placement date:   Placement time:   Site:   Days:   Peripheral IV 01/05/20 Left Antecubital   01/05/20    1236    Antecubital   less than 1          Intake/Output Last 24 hours No intake or output data in the 24 hours ending 01/05/20 1721  Labs/Imaging Results for orders placed or performed during the hospital encounter of 01/05/20 (from the past 48 hour(s))  Basic metabolic panel     Status: Abnormal   Collection Time: 01/05/20 12:34 PM  Result Value Ref Range   Sodium 140 135 - 145 mmol/L   Potassium 4.7 3.5 - 5.1 mmol/L   Chloride 107 98 - 111 mmol/L   CO2 24 22 - 32 mmol/L   Glucose, Bld 253 (H) 70 - 99 mg/dL   BUN 25 (H) 8 - 23 mg/dL   Creatinine, Ser 2.13 (H) 0.61 - 1.24 mg/dL   Calcium 9.5 8.9 - 10.3 mg/dL   GFR calc non Af Amer 30 (L) >60 mL/min   GFR calc Af Amer 35 (L) >60 mL/min   Anion gap 9 5 - 15    Comment: Performed at [facility], [address] [place] [address]  CBC     Status: None   Collection Time: 01/05/20 12:34 PM  Result Value Ref Range   WBC 7.9 4.0 - 10.5 K/uL   RBC 4.23 4.22 - 5.81 MIL/uL   Hemoglobin 13.3 13.0 - 17.0 g/dL   HCT 39.4 39.0 - 52.0 %   MCV 93.1 80.0 - 100.0 fL   MCH 31.4 26.0 - 34.0 pg   MCHC 33.8 30.0 - 36.0 g/dL   RDW 14.1 11.5 - 15.5 %   Platelets 232 150 - 400  K/uL   nRBC 0.0 0.0 - 0.2 %    Comment: Performed at [facility], [place] [address], [place], [place] [address]  Troponin I (High Sensitivity)     Status: None   Collection Time: 01/05/20 12:34 PM  Result Value Ref Range   Troponin I (High Sensitivity) 4 <18 ng/L    Comment: (NOTE) Elevated high sensitivity troponin I (hsTnI) values and significant  changes across serial measurements may suggest ACS but many other  chronic and acute conditions are known to elevate hsTnI results.  Refer to the "Links" section for chest pain algorithms and additional  guidance. Performed at [facility], [place] [address] [place], [place] [address]   Hepatic function panel     Status: Abnormal   Collection Time: 01/05/20 12:34 PM  Result Value Ref Range   Total Protein 7.1 6.5 - 8.1 g/dL   Albumin 4.1 3.5 - 5.0 g/dL   AST 30 15 - 41 U/L   ALT 31 0 - 44 U/L   Alkaline Phosphatase 48 38 - 126 U/L   Total Bilirubin 1.1 0.3 - 1.2 mg/dL   Bilirubin, Direct 0.1 0.0 - 0.2 mg/dL   Indirect Bilirubin 1.0 (H) 0.3 - 0.9 mg/dL    Comment: Performed at [facility] [address], [place], [place] [address]  Respiratory Panel by RT PCR (Flu A&B, Covid) - Nasopharyngeal Swab     Status: None   Collection Time: 01/05/20  1:11 PM   Specimen: Nasopharyngeal Swab  Result Value Ref Range   SARS Coronavirus 2 by RT PCR NEGATIVE NEGATIVE    Comment: (NOTE) SARS-CoV-2 target nucleic acids are NOT DETECTED. The SARS-CoV-2 RNA is generally detectable in upper respiratoy specimens during the acute phase of infection. The lowest concentration of SARS-CoV-2 viral copies this assay can detect is 131 copies/mL. A negative result does not preclude SARS-Cov-2 infection and should not be used as the sole basis for treatment or other patient management decisions. A negative result may occur with  improper specimen collection/handling,  submission of specimen other than nasopharyngeal swab, presence of viral  mutation(s) within the areas targeted by this assay, and inadequate number of viral copies (<131 copies/mL). A negative result must be combined with clinical observations, patient history, and epidemiological information. The expected result is Negative. Fact Sheet for Patients:  PinkCheek.be Fact Sheet for Healthcare Providers:  GravelBags.it This test is not yet ap proved or cleared by the [place] FDA and  has been authorized for detection and/or diagnosis of SARS-CoV-2 by FDA under an Emergency Use Authorization (EUA). This EUA will remain  in effect (meaning this test can be used) for the duration of the COVID-19 declaration under Section 564(b)(1) of the Act, 21 U.S.C. section 360bbb-3(b)(1), unless the authorization is terminated or revoked sooner.    Influenza A by PCR NEGATIVE NEGATIVE   Influenza B by PCR NEGATIVE NEGATIVE    Comment: (NOTE) The Xpert Xpress SARS-CoV-2/FLU/RSV assay is intended as an aid in  the diagnosis of influenza from Nasopharyngeal swab specimens and  should not be used as a sole basis for treatment. Nasal washings and  aspirates are unacceptable for Xpert Xpress SARS-CoV-2/FLU/RSV  testing. Fact Sheet for Patients: PinkCheek.be Fact Sheet for Healthcare Providers: GravelBags.it This test is not yet approved or cleared by the [place] FDA and  has been authorized for detection and/or diagnosis of SARS-CoV-2 by  FDA under an Emergency Use Authorization (EUA). This EUA will remain  in effect (meaning this test can be used) for the duration of the  Covid-19 declaration under Section 564(b)(1) of the Act, 21  U.S.C. section 360bbb-3(b)(1), unless the authorization is  terminated or revoked. Performed at [facility], [place] [address], [place], [place] [address]    *Note: Due to a large number of results and/or encounters  for the requested time period, some results have not been displayed. A complete set of results can be found in Results Review.   DG Chest Portable 1 View  Result Date: 01/05/2020 CLINICAL DATA:  Left-sided chest pain.  Shortness of breath. EXAM: PORTABLE CHEST 1 VIEW COMPARISON:  August 20, 2019 FINDINGS: Stable cardiomegaly. The hila and mediastinum are normal. No pneumothorax. No nodules or masses. No focal infiltrates. IMPRESSION: No active disease. Electronically Signed   By: Dorise Bullion III M.D   On: 01/05/2020 13:10    Pending Labs Unresulted Labs (From admission, onward)    Start     Ordered   Signed and Held  Basic metabolic panel  Tomorrow morning,   R     Signed and Held          Vitals/Pain Today's Vitals   01/05/20 1615 01/05/20 1630 01/05/20 1634 01/05/20 1720  BP: 124/64 127/68    Pulse: (!) 51 60    Resp: 14 15    Temp:      TempSrc:      SpO2: 96% 100%    PainSc:   0-No pain 0-No pain    Isolation Precautions No active isolations  Medications Medications  nitroGLYCERIN (NITROSTAT) SL tablet 0.4 mg (0.4 mg Sublingual Given 01/05/20 1305)  sodium chloride flush (NS) 0.9 % injection 3 mL (3 mLs Intravenous Given 01/05/20 1237)  HYDROmorphone (DILAUDID) injection 0.5 mg (0.5 mg Intravenous Given 01/05/20 1306)    Mobility walks Low fall risk   Focused Assessments Cardiac Assessment Handoff:    Lab Results  Component Value Date   CKTOTAL 37 01/03/2015   CKMB 0.9 01/03/2015   TROPONINI <0.03 11/06/2018   Lab  Results  Component Value Date   DDIMER <0.27 05/30/2015   Does the Patient currently have chest pain? No     R Recommendations: See Admitting Provider Note  Report given to:   Additional Notes:

## 2020-01-05 NOTE — ED Notes (Signed)
Called report again, nurse unavailable at this time. Charge nurse unavailable to take report as well

## 2020-01-05 NOTE — ED Notes (Signed)
Called to give report, Nurse unavailable at this time. Will call back shortly

## 2020-01-06 LAB — CBC
HCT: 35.8 % — ABNORMAL LOW (ref 39.0–52.0)
Hemoglobin: 12.3 g/dL — ABNORMAL LOW (ref 13.0–17.0)
MCH: 31.5 pg (ref 26.0–34.0)
MCHC: 34.4 g/dL (ref 30.0–36.0)
MCV: 91.6 fL (ref 80.0–100.0)
Platelets: 200 10*3/uL (ref 150–400)
RBC: 3.91 MIL/uL — ABNORMAL LOW (ref 4.22–5.81)
RDW: 14.1 % (ref 11.5–15.5)
WBC: 6.6 10*3/uL (ref 4.0–10.5)
nRBC: 0 % (ref 0.0–0.2)

## 2020-01-06 LAB — BASIC METABOLIC PANEL
Anion gap: 10 (ref 5–15)
BUN: 22 mg/dL (ref 8–23)
CO2: 23 mmol/L (ref 22–32)
Calcium: 9.1 mg/dL (ref 8.9–10.3)
Chloride: 103 mmol/L (ref 98–111)
Creatinine, Ser: 1.99 mg/dL — ABNORMAL HIGH (ref 0.61–1.24)
GFR calc Af Amer: 38 mL/min — ABNORMAL LOW (ref >60)
GFR calc non Af Amer: 33 mL/min — ABNORMAL LOW (ref >60)
Glucose, Bld: 300 mg/dL — ABNORMAL HIGH (ref 70–99)
Potassium: 4.4 mmol/L (ref 3.5–5.1)
Sodium: 136 mmol/L (ref 135–145)

## 2020-01-06 LAB — GLUCOSE, CAPILLARY
Glucose-Capillary: 280 mg/dL — ABNORMAL HIGH (ref 70–99)
Glucose-Capillary: 356 mg/dL — ABNORMAL HIGH (ref 70–99)
Glucose-Capillary: 225 mg/dL — ABNORMAL HIGH (ref 70–99)
Glucose-Capillary: 201 mg/dL — ABNORMAL HIGH (ref 70–99)

## 2020-01-06 LAB — HEPARIN LEVEL (UNFRACTIONATED): Heparin Unfractionated: 1.34 IU/mL — ABNORMAL HIGH (ref 0.30–0.70)

## 2020-01-06 LAB — APTT
aPTT: 65 seconds — ABNORMAL HIGH (ref 24–36)
aPTT: 71 seconds — ABNORMAL HIGH (ref 24–36)

## 2020-01-06 MED ORDER — SODIUM CHLORIDE 0.9 % IV SOLN: 250.0000 mL | INTRAVENOUS | Status: DC | PRN

## 2020-01-06 MED ORDER — INSULIN ASPART 100 UNIT/ML ~~LOC~~ SOLN
0.0000 [IU] | Freq: Every day | SUBCUTANEOUS | Status: DC
  Administered 2020-01-06: 2 [IU] via SUBCUTANEOUS
  Administered 2020-01-07: 3 [IU] via SUBCUTANEOUS

## 2020-01-06 MED ORDER — ASPIRIN 81 MG PO CHEW
81.0000 mg | CHEWABLE_TABLET | ORAL | Status: AC
  Administered 2020-01-07: 81 mg via ORAL
  Filled 2020-01-06: qty 1

## 2020-01-06 MED ORDER — INSULIN ASPART 100 UNIT/ML ~~LOC~~ SOLN
0.0000 [IU] | Freq: Three times a day (TID) | SUBCUTANEOUS | Status: DC
  Administered 2020-01-06: 8 [IU] via SUBCUTANEOUS
  Administered 2020-01-06: 17:00:00 5 [IU] via SUBCUTANEOUS
  Administered 2020-01-06: 12:00:00 15 [IU] via SUBCUTANEOUS
  Administered 2020-01-08: 5 [IU] via SUBCUTANEOUS

## 2020-01-06 MED ORDER — INSULIN ASPART 100 UNIT/ML ~~LOC~~ SOLN: 0.0000 [IU] | Freq: Three times a day (TID) | SUBCUTANEOUS | Status: DC

## 2020-01-06 MED ORDER — SODIUM CHLORIDE 0.9% FLUSH: 3.0000 mL | INTRAVENOUS | Status: DC | PRN

## 2020-01-06 MED ORDER — SODIUM CHLORIDE 0.9 % IV SOLN: INTRAVENOUS | Status: DC

## 2020-01-06 MED ORDER — SODIUM CHLORIDE 0.9% FLUSH
3.0000 mL | Freq: Two times a day (BID) | INTRAVENOUS | Status: DC
  Administered 2020-01-07: 3 mL via INTRAVENOUS

## 2020-01-06 NOTE — Progress Notes (Signed)
ANTICOAGULATION CONSULT NOTE - Follow Up Consult  Pharmacy Consult for Heparin Indication: atrial fibrillation  Allergies  Allergen Reactions  . Morphine Other (See Comments)    hallucinations  . Shellfish-Derived Products Itching    Patient Measurements: Height: 5\' 9"  (175.3 cm) Weight: 211 lb 12.8 oz (96.1 kg)(was not done t 5am) IBW/kg (Calculated) : 70.7 Heparin Dosing Weight:  90.7 kg  Vital Signs: Temp: 98 F (36.7 C) (01/31 0800) Temp Source: Oral (01/31 0800) BP: 134/61 (01/31 0923) Pulse Rate: 64 (01/31 0923)  Labs: Recent Labs    01/05/20 1234 01/05/20 1632 01/06/20 0333 01/06/20 1340  HGB 13.3  --  12.3*  --   HCT 39.4  --  35.8*  --   PLT 232  --  200  --   APTT  --   --  65* 71*  HEPARINUNFRC  --   --  1.34*  --   CREATININE 2.13*  --  1.99*  --   TROPONINIHS 4 4  --   --     Estimated Creatinine Clearance: 39 mL/min (A) (by C-G formula based on SCr of 1.99 mg/dL (H)).   Assessment:  Anticoag: Apix PTA (cath Monday ?)  - aPTT 65 slightly low. Hgb 12.3 down. Plts 200. Repeat aPTT 71 now in goal. - Will monitor with aPTT until heparin levels and aPTT correlate   Goal of Therapy:  Heparin level 66-102 units/ml Monitor platelets by anticoagulation protocol: Yes   Plan:  Increase IV heparin to 1450 units/hr.  Daily HL and CBC   [place]ove Haydon S. Alford Highland, PharmD, BCPS Clinical Staff Pharmacist [email or web address] Alford Highland, [employer] 01/06/2020,2:31 PM

## 2020-01-06 NOTE — Progress Notes (Signed)
[place] for Heparin Indication: atrial fibrillation  Allergies  Allergen Reactions  . Morphine Other (See Comments)    hallucinations  . Shellfish-Derived Products Itching    Patient Measurements: Height: 5\' 9"  (175.3 cm) Weight: 211 lb 9.6 oz (96 kg) IBW/kg (Calculated) : 70.7 Heparin Dosing Weight: 89.6 kg  Vital Signs: Temp: 97.7 F (36.5 C) (01/31 0433) Temp Source: Oral (01/31 0433) BP: 134/63 (01/31 0433) Pulse Rate: 66 (01/31 0433)  Labs: Recent Labs    01/05/20 1234 01/05/20 1632 01/06/20 0333  HGB 13.3  --  12.3*  HCT 39.4  --  35.8*  PLT 232  --  200  APTT  --   --  65*  HEPARINUNFRC  --   --  1.34*  CREATININE 2.13*  --  1.99*  TROPONINIHS 4 4  --     Estimated Creatinine Clearance: 38.9 mL/min (A) (by C-G formula based on SCr of 1.99 mg/dL (H)).  Assessment: 74 y.o. male with h/o Afib, Eliquis on hold, for heparin  Goal of Therapy:  Heparin level 0.3-0.7 units/ml aPTT 66-102 seconds Monitor platelets by anticoagulation protocol: Yes   Plan:  Increase Heparin 1450 units/hr  [name] [name], Bronson Curb PharmD. BCPS 01/06/2020,6:48 AM

## 2020-01-06 NOTE — Progress Notes (Signed)
Progress Note  Patient Name: [name] [name] Date of Encounter: 01/06/2020  Primary Cardiologist: Larae Grooms, MD   Subjective   No complaints  Inpatient Medications    Scheduled Meds: . aspirin EC  81 mg Oral Daily  . atorvastatin  80 mg Oral q1800  . fenofibrate  160 mg Oral Daily  . ferrous sulfate  325 mg Oral QODAY  . insulin NPH Human  80 Units Subcutaneous QHS  . isosorbide mononitrate  120 mg Oral Daily  . levothyroxine  100 mcg Oral Daily  . metoprolol succinate  75 mg Oral Daily  . midodrine  10 mg Oral BID WC  . pantoprazole  40 mg Oral Daily  . ranolazine  1,000 mg Oral BID  . sertraline  100 mg Oral BID   Continuous Infusions: . sodium chloride 75 mL/hr at 01/05/20 1909  . heparin 1,450 Units/hr (01/06/20 0718)   PRN Meds: acetaminophen, nitroGLYCERIN, ondansetron (ZOFRAN) IV   Vital Signs    Vitals:   01/05/20 1730 01/05/20 1834 01/05/20 2136 01/06/20 0433  BP: (!) 148/61  (!) 136/59 134/63  Pulse: (!) 55 (!) 55 (!) 59 66  Resp: 19  16 18   Temp:  97.8 F (36.6 C) 98.2 F (36.8 C) 97.7 F (36.5 C)  TempSrc:  Oral Oral Oral  SpO2: 95% 95% 98% 96%  Weight:  96 kg    Height:  5\' 9"  (1.753 m)      Intake/Output Summary (Last 24 hours) at 01/06/2020 0750 Last data filed at 01/06/2020 0500 Gross per 24 hour  Intake 120 ml  Output --  Net 120 ml   Last 3 Weights 01/05/2020 01/05/2020 12/27/2019  Weight (lbs) 211 lb 9.6 oz 218 lb 11.1 oz 218 lb 12.8 oz  Weight (kg) 95.981 kg 99.2 kg 99.247 kg      Telemetry    NSR- Personally Reviewed  ECG    n/a - Personally Reviewed  Physical Exam   GEN: No acute distress.   Neck: No JVD Cardiac: RRR, no murmurs, rubs, or gallops.  Respiratory: Clear to auscultation bilaterally. GI: Soft, nontender, non-distended  MS: No edema; No deformity. Neuro:  Nonfocal  Psych: Normal affect   Labs    High Sensitivity Troponin:   Recent Labs  Lab 01/05/20 1234 01/05/20 1632  TROPONINIHS 4 4       Chemistry Recent Labs  Lab 01/05/20 1234 01/06/20 0333  NA 140 136  K 4.7 4.4  CL 107 103  CO2 24 23  GLUCOSE 253* 300*  BUN 25* 22  CREATININE 2.13* 1.99*  CALCIUM 9.5 9.1  PROT 7.1  --   ALBUMIN 4.1  --   AST 30  --   ALT 31  --   ALKPHOS 48  --   BILITOT 1.1  --   GFRNONAA 30* 33*  GFRAA 35* 38*  ANIONGAP 9 10     Hematology Recent Labs  Lab 01/05/20 1234 01/06/20 0333  WBC 7.9 6.6  RBC 4.23 3.91*  HGB 13.3 12.3*  HCT 39.4 35.8*  MCV 93.1 91.6  MCH 31.4 31.5  MCHC 33.8 34.4  RDW 14.1 14.1  PLT 232 200    BNPNo results for input(s): BNP, PROBNP in the last 168 hours.   DDimer No results for input(s): DDIMER in the last 168 hours.   Radiology    US RENAL  Result Date: 01/05/2020 CLINICAL DATA:  Chronic renal disease. EXAM: RENAL / URINARY TRACT ULTRASOUND COMPLETE COMPARISON:  None. FINDINGS:  Right Kidney: Renal measurements: 9.5 x 4.6 x 4.9 cm = volume: 110 mL . Echogenicity within normal limits. No mass or hydronephrosis visualized. Left Kidney: Renal measurements: 9.9 x 5.5 x 5.6 cm = volume: 159.0 mL. Echogenicity within normal limits. No mass or hydronephrosis visualized. Bladder: Appears normal for degree of bladder distention. Other: None. IMPRESSION: No cause for chronic renal disease identified.  Normal study. Electronically Signed   By: Dorise Bullion III M.D   On: 01/05/2020 19:56   DG Chest Portable 1 View  Result Date: 01/05/2020 CLINICAL DATA:  Left-sided chest pain.  Shortness of breath. EXAM: PORTABLE CHEST 1 VIEW COMPARISON:  August 20, 2019 FINDINGS: Stable cardiomegaly. The hila and mediastinum are normal. No pneumothorax. No nodules or masses. No focal infiltrates. IMPRESSION: No active disease. Electronically Signed   By: Dorise Bullion III M.D   On: 01/05/2020 13:10    Cardiac Studies    Patient Profile     [name] [name] is a 74 y.o. male with past medical history of CAD s/p CABG, PAF, PSVT, CKD stage III, noncompliance,  orthostatic hypotension, iron deficiency anemia, chronic diastolic CHF, HTN, HLD, DM 2, right bundle Fitz Matsuo block and history of kidney stone   Assessment & Plan    1. CAD - history of prior CABG - presented with chest pain, no admission concerning for unstable angina - hstrops neg, EKG SR with RBBB with chronic associated anterior TWIs - 08/2019 echo LVEF 60-65%, grade II diastolic dysfunction - Last cardiac catheterization  2019 showed severe two-vessel CAD with all 4 bypasses open - plan for cath Monday, work over the weekend to hydrate to improve renal function. As an aside patient is very skeptical about stress tests as he reports all his prior tests have always been normal even prior this bypass  - no significant chest pain currently.  Medical therapy with ASA 81, atorva 80, hep gtt, imdur 120, toprol 75, ranexa 1000mg  bid. No ACE/ARB due to renal dysfunction - plan for cath Monday    2. PAF - eliquis on hold, on hep gtt in anticipation for cath Monday   3. CKD 3 - Cr has been worsening over the last several months - on admit Cr 2.13, today 1.99 with gentle IVFs/ Continue hydrationon, on NS at 23ml/hr.  - home lasix on hold   4. Orthostatic hypotension - continue his home midodrine.     For questions or updates, please contact [place] Please consult [email or web address] for contact info under        Signed, Carlyle Dolly, MD  01/06/2020, 7:50 AM

## 2020-01-07 ENCOUNTER — Encounter (HOSPITAL_COMMUNITY)
Admission: EM | Disposition: A | Discharge status: Home / Self Care | Payer: Self-pay | Source: Home / Self Care | Attending: Cardiology

## 2020-01-07 DIAGNOSIS — E1121 Type 2 diabetes mellitus with diabetic nephropathy: Secondary | ICD-10-CM

## 2020-01-07 DIAGNOSIS — E1169 Type 2 diabetes mellitus with other specified complication: Secondary | ICD-10-CM

## 2020-01-07 DIAGNOSIS — I25119 Atherosclerotic heart disease of native coronary artery with unspecified angina pectoris: Secondary | ICD-10-CM

## 2020-01-07 DIAGNOSIS — I480 Paroxysmal atrial fibrillation: Secondary | ICD-10-CM

## 2020-01-07 DIAGNOSIS — N183 Chronic kidney disease, stage 3 unspecified: Secondary | ICD-10-CM

## 2020-01-07 DIAGNOSIS — I25110 Atherosclerotic heart disease of native coronary artery with unstable angina pectoris: Principal | ICD-10-CM

## 2020-01-07 DIAGNOSIS — E785 Hyperlipidemia, unspecified: Secondary | ICD-10-CM

## 2020-01-07 DIAGNOSIS — N184 Chronic kidney disease, stage 4 (severe): Secondary | ICD-10-CM

## 2020-01-07 DIAGNOSIS — Z794 Long term (current) use of insulin: Secondary | ICD-10-CM

## 2020-01-07 HISTORY — PX: RIGHT/LEFT HEART CATH AND CORONARY/GRAFT ANGIOGRAPHY: CATH118267

## 2020-01-07 LAB — POCT I-STAT 7, (LYTES, BLD GAS, ICA,H+H)
Acid-base deficit: 3 mmol/L — ABNORMAL HIGH (ref 0.0–2.0)
Bicarbonate: 22 mmol/L (ref 20.0–28.0)
Calcium, Ion: 1.25 mmol/L (ref 1.15–1.40)
HCT: 35 % — ABNORMAL LOW (ref 39.0–52.0)
Hemoglobin: 11.9 g/dL — ABNORMAL LOW (ref 13.0–17.0)
O2 Saturation: 99 %
Potassium: 4.2 mmol/L (ref 3.5–5.1)
Sodium: 140 mmol/L (ref 135–145)
TCO2: 23 mmol/L (ref 22–32)
pCO2 arterial: 36.4 mmHg (ref 32.0–48.0)
pH, Arterial: 7.388 (ref 7.350–7.450)
pO2, Arterial: 116 mmHg — ABNORMAL HIGH (ref 83.0–108.0)

## 2020-01-07 LAB — GLUCOSE, CAPILLARY
Glucose-Capillary: 205 mg/dL — ABNORMAL HIGH (ref 70–99)
Glucose-Capillary: 207 mg/dL — ABNORMAL HIGH (ref 70–99)
Glucose-Capillary: 175 mg/dL — ABNORMAL HIGH (ref 70–99)
Glucose-Capillary: 260 mg/dL — ABNORMAL HIGH (ref 70–99)

## 2020-01-07 LAB — BASIC METABOLIC PANEL
Anion gap: 10 (ref 5–15)
BUN: 19 mg/dL (ref 8–23)
CO2: 22 mmol/L (ref 22–32)
Calcium: 9.1 mg/dL (ref 8.9–10.3)
Chloride: 109 mmol/L (ref 98–111)
Creatinine, Ser: 1.98 mg/dL — ABNORMAL HIGH (ref 0.61–1.24)
GFR calc Af Amer: 38 mL/min — ABNORMAL LOW (ref >60)
GFR calc non Af Amer: 33 mL/min — ABNORMAL LOW (ref >60)
Glucose, Bld: 209 mg/dL — ABNORMAL HIGH (ref 70–99)
Potassium: 4.2 mmol/L (ref 3.5–5.1)
Sodium: 141 mmol/L (ref 135–145)

## 2020-01-07 LAB — POCT I-STAT EG7
Acid-base deficit: 2 mmol/L (ref 0.0–2.0)
Bicarbonate: 23.4 mmol/L (ref 20.0–28.0)
Calcium, Ion: 1.25 mmol/L (ref 1.15–1.40)
HCT: 36 % — ABNORMAL LOW (ref 39.0–52.0)
Hemoglobin: 12.2 g/dL — ABNORMAL LOW (ref 13.0–17.0)
O2 Saturation: 71 %
Potassium: 4.3 mmol/L (ref 3.5–5.1)
Sodium: 140 mmol/L (ref 135–145)
TCO2: 25 mmol/L (ref 22–32)
pCO2, Ven: 40.6 mmHg — ABNORMAL LOW (ref 44.0–60.0)
pH, Ven: 7.37 (ref 7.250–7.430)
pO2, Ven: 38 mmHg (ref 32.0–45.0)

## 2020-01-07 LAB — CBC
HCT: 37.3 % — ABNORMAL LOW (ref 39.0–52.0)
Hemoglobin: 12.8 g/dL — ABNORMAL LOW (ref 13.0–17.0)
MCH: 31.6 pg (ref 26.0–34.0)
MCHC: 34.3 g/dL (ref 30.0–36.0)
MCV: 92.1 fL (ref 80.0–100.0)
Platelets: 196 10*3/uL (ref 150–400)
RBC: 4.05 MIL/uL — ABNORMAL LOW (ref 4.22–5.81)
RDW: 13.7 % (ref 11.5–15.5)
WBC: 6.1 10*3/uL (ref 4.0–10.5)
nRBC: 0 % (ref 0.0–0.2)

## 2020-01-07 LAB — APTT: aPTT: 82 seconds — ABNORMAL HIGH (ref 24–36)

## 2020-01-07 LAB — HEPARIN LEVEL (UNFRACTIONATED): Heparin Unfractionated: 0.76 IU/mL — ABNORMAL HIGH (ref 0.30–0.70)

## 2020-01-07 LAB — POCT ACTIVATED CLOTTING TIME: Activated Clotting Time: 131 seconds

## 2020-01-07 SURGERY — RIGHT/LEFT HEART CATH AND CORONARY/GRAFT ANGIOGRAPHY
Anesthesia: LOCAL

## 2020-01-07 MED ORDER — HEPARIN SODIUM (PORCINE) 5000 UNIT/ML IJ SOLN
5000.0000 [IU] | Freq: Three times a day (TID) | INTRAMUSCULAR | Status: DC
  Administered 2020-01-08: 5000 [IU] via SUBCUTANEOUS
  Filled 2020-01-07: qty 1

## 2020-01-07 MED ORDER — LIDOCAINE HCL (PF) 1 % IJ SOLN
INTRAMUSCULAR | Status: DC | PRN
  Administered 2020-01-07: 19 mL

## 2020-01-07 MED ORDER — LIDOCAINE HCL (PF) 1 % IJ SOLN
INTRAMUSCULAR | Status: AC
  Filled 2020-01-07: qty 30

## 2020-01-07 MED ORDER — IOHEXOL 350 MG/ML SOLN
INTRAVENOUS | Status: DC | PRN
  Administered 2020-01-07: 14:00:00 65 mL

## 2020-01-07 MED ORDER — HYDRALAZINE HCL 20 MG/ML IJ SOLN: 10.0000 mg | INTRAMUSCULAR | Status: AC | PRN

## 2020-01-07 MED ORDER — FENTANYL CITRATE (PF) 100 MCG/2ML IJ SOLN
INTRAMUSCULAR | Status: DC | PRN
  Administered 2020-01-07: 25 ug via INTRAVENOUS

## 2020-01-07 MED ORDER — MIDAZOLAM HCL 2 MG/2ML IJ SOLN
INTRAMUSCULAR | Status: AC
  Filled 2020-01-07: qty 2

## 2020-01-07 MED ORDER — LABETALOL HCL 5 MG/ML IV SOLN: 10.0000 mg | INTRAVENOUS | Status: AC | PRN

## 2020-01-07 MED ORDER — HEPARIN (PORCINE) IN NACL 1000-0.9 UT/500ML-% IV SOLN
INTRAVENOUS | Status: DC | PRN
  Administered 2020-01-07 (×2): 500 mL

## 2020-01-07 MED ORDER — ACETAMINOPHEN 325 MG PO TABS
650.0000 mg | ORAL_TABLET | ORAL | Status: DC | PRN
  Administered 2020-01-08: 02:00:00 650 mg via ORAL
  Filled 2020-01-07: qty 2

## 2020-01-07 MED ORDER — DIVALPROEX SODIUM ER 500 MG PO TB24
750.0000 mg | ORAL_TABLET | Freq: Every day | ORAL | Status: DC
  Administered 2020-01-07: 23:00:00 750 mg via ORAL
  Filled 2020-01-07 (×2): qty 1

## 2020-01-07 MED ORDER — ONDANSETRON HCL 4 MG/2ML IJ SOLN: 4.0000 mg | Freq: Four times a day (QID) | INTRAMUSCULAR | Status: DC | PRN

## 2020-01-07 MED ORDER — IOHEXOL 350 MG/ML SOLN
INTRAVENOUS | Status: AC
  Filled 2020-01-07: qty 1

## 2020-01-07 MED ORDER — GABAPENTIN 100 MG PO CAPS
200.0000 mg | ORAL_CAPSULE | Freq: Every day | ORAL | Status: DC
  Filled 2020-01-07: qty 2

## 2020-01-07 MED ORDER — SODIUM CHLORIDE 0.9 % IV SOLN: 250.0000 mL | INTRAVENOUS | Status: DC | PRN

## 2020-01-07 MED ORDER — HEPARIN (PORCINE) IN NACL 1000-0.9 UT/500ML-% IV SOLN
INTRAVENOUS | Status: AC
  Filled 2020-01-07: qty 1000

## 2020-01-07 MED ORDER — FENTANYL CITRATE (PF) 100 MCG/2ML IJ SOLN
INTRAMUSCULAR | Status: AC
  Filled 2020-01-07: qty 2

## 2020-01-07 MED ORDER — MIDAZOLAM HCL 2 MG/2ML IJ SOLN
INTRAMUSCULAR | Status: DC | PRN
  Administered 2020-01-07: 1 mg via INTRAVENOUS

## 2020-01-07 MED ORDER — GABAPENTIN 100 MG PO CAPS
100.0000 mg | ORAL_CAPSULE | Freq: Every day | ORAL | Status: DC
  Filled 2020-01-07: qty 1

## 2020-01-07 MED ORDER — ROPINIROLE HCL 1 MG PO TABS
1.0000 mg | ORAL_TABLET | Freq: Every day | ORAL | Status: DC
  Administered 2020-01-07: 23:00:00 1 mg via ORAL
  Filled 2020-01-07: qty 1

## 2020-01-07 MED ORDER — VITAMIN D (ERGOCALCIFEROL) 1.25 MG (50000 UNIT) PO CAPS
50000.0000 [IU] | ORAL_CAPSULE | ORAL | Status: DC
  Administered 2020-01-07: 10:00:00 50000 [IU] via ORAL
  Filled 2020-01-07 (×2): qty 1

## 2020-01-07 MED ORDER — SODIUM CHLORIDE 0.9% FLUSH
3.0000 mL | Freq: Two times a day (BID) | INTRAVENOUS | Status: DC
  Administered 2020-01-07 – 2020-01-08 (×3): 3 mL via INTRAVENOUS

## 2020-01-07 MED ORDER — SODIUM CHLORIDE 0.9% FLUSH: 3.0000 mL | INTRAVENOUS | Status: DC | PRN

## 2020-01-07 SURGICAL SUPPLY — 12 items
CATH INFINITI 5 FR IM (CATHETERS) ×1 IMPLANT
CATH INFINITI 5FR MPB2 (CATHETERS) ×1 IMPLANT
CATH INFINITI 5FR MULTPACK ANG (CATHETERS) ×1 IMPLANT
CATH SWAN GANZ 7F STRAIGHT (CATHETERS) ×1 IMPLANT
KIT HEART LEFT (KITS) ×2 IMPLANT
PACK CARDIAC CATHETERIZATION (CUSTOM PROCEDURE TRAY) ×2 IMPLANT
SHEATH PINNACLE 5F 10CM (SHEATH) ×1 IMPLANT
SHEATH PINNACLE 7F 10CM (SHEATH) ×1 IMPLANT
SHEATH PROBE COVER 6X72 (BAG) ×1 IMPLANT
SYR 10CC CONTROL (SYRINGE) ×1 IMPLANT
TRANSDUCER W/STOPCOCK (MISCELLANEOUS) ×2 IMPLANT
WIRE EMERALD 3MM-J .035X150CM (WIRE) ×1 IMPLANT

## 2020-01-07 NOTE — Progress Notes (Signed)
ANTICOAGULATION CONSULT NOTE - Follow Up Consult  Pharmacy Consult for Heparin Indication: atrial fibrillation  Allergies  Allergen Reactions  . Morphine Other (See Comments)    hallucinations  . Shellfish-Derived Products Itching    Patient Measurements: Height: 5\' 9"  (175.3 cm) Weight: 210 lb 8 oz (95.5 kg) IBW/kg (Calculated) : 70.7 Heparin Dosing Weight:  90.7 kg  Vital Signs: Temp: 97.7 F (36.5 C) (02/01 0603) Temp Source: Oral (02/01 0603) BP: 145/69 (02/01 0603) Pulse Rate: 56 (02/01 0603)  Labs: Recent Labs    01/05/20 1234 01/05/20 1234 01/05/20 1632 01/06/20 0333 01/06/20 1340 01/07/20 0448  HGB 13.3   < >  --  12.3*  --  12.8*  HCT 39.4  --   --  35.8*  --  37.3*  PLT 232  --   --  200  --  196  APTT  --   --   --  65* 71*  --   HEPARINUNFRC  --   --   --  1.34*  --  0.76*  CREATININE 2.13*  --   --  1.99*  --   --   TROPONINIHS 4  --  4  --   --   --    < > = values in this interval not displayed.    Estimated Creatinine Clearance: 38.8 mL/min (A) (by C-G formula based on SCr of 1.99 mg/dL (H)).   Assessment: 74 year old male with history of afib on apixaban prior to admit. Currently being bridge with heparin, plan is for cath today.   Heparin level just above goal but likely elevated due to recent apixaban. Will add on aptt to am draw.   - Will monitor with aPTT until heparin levels and aPTT correlate   Goal of Therapy:  Heparin level 66-102 units/ml Monitor platelets by anticoagulation protocol: Yes   Plan:  Continue IV heparin to 1450 units/hr.  Follow up after cath  Erin Hearing PharmD., BCPS Clinical Pharmacist 01/07/2020 7:19 AM

## 2020-01-07 NOTE — CV Procedure (Signed)
   Left and right heart cath via right femoral artery and femoral vein using real-time vascular ultrasound for guidance.  Patent bypass grafts as documented on previous angiogram.  This includes normal left internal mammary flow.  Mammary was selectively engaged.  Native right coronary is patent.  Circumflex is widely patent  LAD is occluded in the mid vessel proximal to the graft insertion site.  LV function was not assessed with contrast.  LVEDP was normal.  Right heart pressures are normal.  Wedge is 12 mmHg.  Coronary anatomy is unchanged from the most recent prior angiogram in 2019.

## 2020-01-07 NOTE — Interval H&P Note (Signed)
Cath Lab Visit (complete for each Cath Lab visit)  Clinical Evaluation Leading to the Procedure:   ACS: Yes.    Non-ACS:    Anginal Classification: CCS III  Anti-ischemic medical therapy: Maximal Therapy (2 or more classes of medications)  Non-Invasive Test Results: No non-invasive testing performed  Prior CABG: Previous CABG      History and Physical Interval Note:  01/07/2020 12:51 PM  [name] [name]  has presented today for surgery, with the diagnosis of unstable angina.  The various methods of treatment have been discussed with the patient and family. After consideration of risks, benefits and other options for treatment, the patient has consented to  Procedure(s): RIGHT/LEFT HEART CATH AND CORONARY/GRAFT ANGIOGRAPHY (N/A) as a surgical intervention.  The patient's history has been reviewed, patient examined, no change in status, stable for surgery.  I have reviewed the patient's chart and labs.  Questions were answered to the patient's satisfaction.     [name]

## 2020-01-07 NOTE — Progress Notes (Addendum)
Progress Note  Patient Name: [name] [name] Date of Encounter: 01/07/2020  Primary Cardiologist: Larae Grooms, MD   Subjective   No chest pain.   Inpatient Medications    Scheduled Meds: . aspirin EC  81 mg Oral Daily  . atorvastatin  80 mg Oral q1800  . divalproex  750 mg Oral QHS  . fenofibrate  160 mg Oral Daily  . ferrous sulfate  325 mg Oral QODAY  . gabapentin  100 mg Oral QHS   Or  . gabapentin  200 mg Oral QHS  . insulin aspart  0-15 Units Subcutaneous TID WC  . insulin aspart  0-5 Units Subcutaneous QHS  . insulin NPH Human  80 Units Subcutaneous QHS  . isosorbide mononitrate  120 mg Oral Daily  . levothyroxine  100 mcg Oral Daily  . metoprolol succinate  75 mg Oral Daily  . pantoprazole  40 mg Oral Daily  . ranolazine  1,000 mg Oral BID  . rOPINIRole  1 mg Oral QHS  . sertraline  100 mg Oral BID  . sodium chloride flush  3 mL Intravenous Q12H  . Vitamin D (Ergocalciferol)  50,000 Units Oral Q Mon   Continuous Infusions: . sodium chloride 75 mL/hr at 01/06/20 1922  . sodium chloride    . sodium chloride    . heparin 1,450 Units/hr (01/06/20 1158)   PRN Meds: sodium chloride, acetaminophen, nitroGLYCERIN, ondansetron (ZOFRAN) IV, sodium chloride flush   Vital Signs    Vitals:   01/06/20 1236 01/06/20 1554 01/06/20 2053 01/07/20 0603  BP:  (!) 119/58 (!) 142/66 (!) 145/69  Pulse:  61 (!) 59 (!) 56  Resp:  20 18 20   Temp:  97.9 F (36.6 C) 97.7 F (36.5 C) 97.7 F (36.5 C)  TempSrc:  Oral Oral Oral  SpO2:  97% 97% 97%  Weight: 96.1 kg   95.5 kg  Height:        Intake/Output Summary (Last 24 hours) at 01/07/2020 0837 Last data filed at 01/07/2020 0500 Gross per 24 hour  Intake 2774.61 ml  Output 1275 ml  Net 1499.61 ml   Last 3 Weights 01/07/2020 01/06/2020 01/05/2020  Weight (lbs) 210 lb 8 oz 211 lb 12.8 oz 211 lb 9.6 oz  Weight (kg) 95.482 kg 96.072 kg 95.981 kg      Telemetry    SR - Personally Reviewed  ECG   No new tracing  this morning.   Physical Exam  Pleasant older WM GEN: No acute distress.   Neck: No JVD Cardiac: RRR, no murmurs, rubs, or gallops.  Respiratory: Clear to auscultation bilaterally. GI: Soft, nontender, non-distended  MS: No edema; No deformity. Neuro:  Nonfocal  Psych: Normal affect   Labs    High Sensitivity Troponin:   Recent Labs  Lab 01/05/20 1234 01/05/20 1632  TROPONINIHS 4 4      Chemistry Recent Labs  Lab 01/05/20 1234 01/06/20 0333  NA 140 136  K 4.7 4.4  CL 107 103  CO2 24 23  GLUCOSE 253* 300*  BUN 25* 22  CREATININE 2.13* 1.99*  CALCIUM 9.5 9.1  PROT 7.1  --   ALBUMIN 4.1  --   AST 30  --   ALT 31  --   ALKPHOS 48  --   BILITOT 1.1  --   GFRNONAA 30* 33*  GFRAA 35* 38*  ANIONGAP 9 10     Hematology Recent Labs  Lab 01/05/20 1234 01/06/20 0333 01/07/20 0448  WBC  7.9 6.6 6.1  RBC 4.23 3.91* 4.05*  HGB 13.3 12.3* 12.8*  HCT 39.4 35.8* 37.3*  MCV 93.1 91.6 92.1  MCH 31.4 31.5 31.6  MCHC 33.8 34.4 34.3  RDW 14.1 14.1 13.7  PLT 232 200 196    BNPNo results for input(s): BNP, PROBNP in the last 168 hours.   DDimer No results for input(s): DDIMER in the last 168 hours.   Radiology    US RENAL  Result Date: 01/05/2020 CLINICAL DATA:  Chronic renal disease. EXAM: RENAL / URINARY TRACT ULTRASOUND COMPLETE COMPARISON:  None. FINDINGS: Right Kidney: Renal measurements: 9.5 x 4.6 x 4.9 cm = volume: 110 mL . Echogenicity within normal limits. No mass or hydronephrosis visualized. Left Kidney: Renal measurements: 9.9 x 5.5 x 5.6 cm = volume: 159.0 mL. Echogenicity within normal limits. No mass or hydronephrosis visualized. Bladder: Appears normal for degree of bladder distention. Other: None. IMPRESSION: No cause for chronic renal disease identified.  Normal study. Electronically Signed   By: Dorise Bullion III M.D   On: 01/05/2020 19:56   DG Chest Portable 1 View  Result Date: 01/05/2020 CLINICAL DATA:  Left-sided chest pain.  Shortness of  breath. EXAM: PORTABLE CHEST 1 VIEW COMPARISON:  August 20, 2019 FINDINGS: Stable cardiomegaly. The hila and mediastinum are normal. No pneumothorax. No nodules or masses. No focal infiltrates. IMPRESSION: No active disease. Electronically Signed   By: Dorise Bullion III M.D   On: 01/05/2020 13:10    Cardiac Studies   N/a   Patient Profile     74 y.o. male withpast medical history of CAD s/pCABG, PAF, PSVT, CKD stage III, noncompliance, orthostatic hypotension, iron deficiency anemia, chronicdiastolicCHF,HTN,HLD, DM 2, right bundle branch block and history of kidney stone  Assessment & Plan    1. CAD hx of CABG: presented with chest pain and shortness of breath over the past couple of weeks. hsTn negative. EKG with SR, RBBB and chronic associated anterior TWIs. No chest pain overnight. Planned for cardiac cath today. -- The patient understands that risks included but are not limited to stroke (1 in 1000), death (1 in 52), kidney failure [usually temporary] (1 in 500), bleeding (1 in 200), allergic reaction [possibly serious] (1 in 200).   Currently on IV heparin with Eliquis on hold for ACS presentation.  Troponins are negative  Is on beta-blocker and statin at home.  He also remains on Ranexa max dose along with max dose Imdur.  Only other medication we can consider adding an anginal would be calcium channel blocker.  2. PAF: Eliquis on hold, remains on IV heparin. Remains in SR.  Remains on Toprol-XL 5 mg daily.  Restart Eliquis once stable post cath/PCI.  3. CKD stage IIIb: Cr 2.13 on admission. Down to 1.99 yesterday, BMET pending this morning.   Unfortunately, creatinine did not improve much today down to 1.98 indicating that likely this may be his baseline now.  If elevated LVEDP on left heart catheter would consider right heart cath to exclude potential cardiorenal.  We will avoid LV gram and order 2D echocardiogram for better assessment of EF.  Determine potential  need for diuretic based on cath findings.  4. Orthostatic hypotension: will stop midodrine this morning as blood pressures are stable. Continue regular home BP meds.  5. HL: on statin; and fenofibrate  6. IDA: on Iron supplement  7.  Diabetes mellitus, on insulin -will return to home doses on discharge.      Signed, Reino Bellis, NP  01/07/2020, 8:37 AM    ATTENDING ATTESTATION  I have seen, examined and evaluated the patient this morning along with Reino Bellis, NP-C.  After reviewing all the available data and chart, we discussed the patients laboratory, study & physical findings as well as symptoms in detail. I agree with her findings, examination as well as impression recommendations as per our discussion.    Patient presenting here with worsening symptoms of exertional dyspnea and chest discomfort concerning for progressive angina.  He has known CAD with four-vessel CABG-all grafts patent by most recent cath December 15, 2017.  Agree with plan for cardiac catheterization.  Major issue being CKD now stable creatinine at 1.98.  He has been hydrated overnight.  Plan would be diagnostic cath, if EDP elevated, would add right heart cath.  Check 2D echocardiogram to avoid excess contrast. If severe or significant lesion, will then proceed with PCI, however if stable if lesion, would consider staged PCI after intermittent hydration.  We will determine his diuretic dosing based on findings.  Once is Procedures are complete, we will go back on Eliquis.  For PCI, would use Plavix, and consider Plavix plus Eliquis without aspirin.   Glenetta Hew, [name], M.S. Interventional Cardiologist   Pager # [phone] Phone # [phone] [address] [place], [place] [address]     For questions or updates, please contact [place] Please consult [email or web address] for contact info under

## 2020-01-07 NOTE — Progress Notes (Signed)
Cath lab called and stated they were ready for pt. Transport and Agricultural consultant brought pt to cath lab for procedure. VSS upon transportation.

## 2020-01-07 NOTE — Plan of Care (Signed)

## 2020-01-07 NOTE — H&P (View-Only) (Signed)
Progress Note  Patient Name: [name] [name] Date of Encounter: 01/07/2020  Primary Cardiologist: Larae Grooms, MD   Subjective   No chest pain.   Inpatient Medications    Scheduled Meds:  aspirin EC  81 mg Oral Daily   atorvastatin  80 mg Oral q1800   divalproex  750 mg Oral QHS   fenofibrate  160 mg Oral Daily   ferrous sulfate  325 mg Oral QODAY   gabapentin  100 mg Oral QHS   Or   gabapentin  200 mg Oral QHS   insulin aspart  0-15 Units Subcutaneous TID WC   insulin aspart  0-5 Units Subcutaneous QHS   insulin NPH Human  80 Units Subcutaneous QHS   isosorbide mononitrate  120 mg Oral Daily   levothyroxine  100 mcg Oral Daily   metoprolol succinate  75 mg Oral Daily   pantoprazole  40 mg Oral Daily   ranolazine  1,000 mg Oral BID   rOPINIRole  1 mg Oral QHS   sertraline  100 mg Oral BID   sodium chloride flush  3 mL Intravenous Q12H   Vitamin D (Ergocalciferol)  50,000 Units Oral Q Mon   Continuous Infusions:  sodium chloride 75 mL/hr at 01/06/20 1922   sodium chloride     sodium chloride     heparin 1,450 Units/hr (01/06/20 1158)   PRN Meds: sodium chloride, acetaminophen, nitroGLYCERIN, ondansetron (ZOFRAN) IV, sodium chloride flush   Vital Signs    Vitals:   01/06/20 1236 01/06/20 1554 01/06/20 2053 01/07/20 0603  BP:  (!) 119/58 (!) 142/66 (!) 145/69  Pulse:  61 (!) 59 (!) 56  Resp:  20 18 20   Temp:  97.9 F (36.6 C) 97.7 F (36.5 C) 97.7 F (36.5 C)  TempSrc:  Oral Oral Oral  SpO2:  97% 97% 97%  Weight: 96.1 kg   95.5 kg  Height:        Intake/Output Summary (Last 24 hours) at 01/07/2020 0837 Last data filed at 01/07/2020 0500 Gross per 24 hour  Intake 2774.61 ml  Output 1275 ml  Net 1499.61 ml   Last 3 Weights 01/07/2020 01/06/2020 01/05/2020  Weight (lbs) 210 lb 8 oz 211 lb 12.8 oz 211 lb 9.6 oz  Weight (kg) 95.482 kg 96.072 kg 95.981 kg      Telemetry    SR - Personally Reviewed  ECG   No new tracing  this morning.   Physical Exam  Pleasant older WM GEN: No acute distress.   Neck: No JVD Cardiac: RRR, no murmurs, rubs, or gallops.  Respiratory: Clear to auscultation bilaterally. GI: Soft, nontender, non-distended  MS: No edema; No deformity. Neuro:  Nonfocal  Psych: Normal affect   Labs    High Sensitivity Troponin:   Recent Labs  Lab 01/05/20 1234 01/05/20 1632  TROPONINIHS 4 4      Chemistry Recent Labs  Lab 01/05/20 1234 01/06/20 0333  NA 140 136  K 4.7 4.4  CL 107 103  CO2 24 23  GLUCOSE 253* 300*  BUN 25* 22  CREATININE 2.13* 1.99*  CALCIUM 9.5 9.1  PROT 7.1  --   ALBUMIN 4.1  --   AST 30  --   ALT 31  --   ALKPHOS 48  --   BILITOT 1.1  --   GFRNONAA 30* 33*  GFRAA 35* 38*  ANIONGAP 9 10     Hematology Recent Labs  Lab 01/05/20 1234 01/06/20 0333 01/07/20 0448  WBC  7.9 6.6 6.1  RBC 4.23 3.91* 4.05*  HGB 13.3 12.3* 12.8*  HCT 39.4 35.8* 37.3*  MCV 93.1 91.6 92.1  MCH 31.4 31.5 31.6  MCHC 33.8 34.4 34.3  RDW 14.1 14.1 13.7  PLT 232 200 196    BNPNo results for input(s): BNP, PROBNP in the last 168 hours.   DDimer No results for input(s): DDIMER in the last 168 hours.   Radiology    US RENAL  Result Date: 01/05/2020 CLINICAL DATA:  Chronic renal disease. EXAM: RENAL / URINARY TRACT ULTRASOUND COMPLETE COMPARISON:  None. FINDINGS: Right Kidney: Renal measurements: 9.5 x 4.6 x 4.9 cm = volume: 110 mL . Echogenicity within normal limits. No mass or hydronephrosis visualized. Left Kidney: Renal measurements: 9.9 x 5.5 x 5.6 cm = volume: 159.0 mL. Echogenicity within normal limits. No mass or hydronephrosis visualized. Bladder: Appears normal for degree of bladder distention. Other: None. IMPRESSION: No cause for chronic renal disease identified.  Normal study. Electronically Signed   By: Dorise Bullion III M.D   On: 01/05/2020 19:56   DG Chest Portable 1 View  Result Date: 01/05/2020 CLINICAL DATA:  Left-sided chest pain.  Shortness of  breath. EXAM: PORTABLE CHEST 1 VIEW COMPARISON:  August 20, 2019 FINDINGS: Stable cardiomegaly. The hila and mediastinum are normal. No pneumothorax. No nodules or masses. No focal infiltrates. IMPRESSION: No active disease. Electronically Signed   By: Dorise Bullion III M.D   On: 01/05/2020 13:10    Cardiac Studies   N/a   Patient Profile     74 y.o. male withpast medical history of CAD s/pCABG, PAF, PSVT, CKD stage III, noncompliance, orthostatic hypotension, iron deficiency anemia, chronicdiastolicCHF,HTN,HLD, DM 2, right bundle branch block and history of kidney stone  Assessment & Plan    1. CAD hx of CABG: presented with chest pain and shortness of breath over the past couple of weeks. hsTn negative. EKG with SR, RBBB and chronic associated anterior TWIs. No chest pain overnight. Planned for cardiac cath today. -- The patient understands that risks included but are not limited to stroke (1 in 1000), death (1 in 48), kidney failure [usually temporary] (1 in 500), bleeding (1 in 200), allergic reaction [possibly serious] (1 in 200).   Currently on IV heparin with Eliquis on hold for ACS presentation.  Troponins are negative  Is on beta-blocker and statin at home.  He also remains on Ranexa max dose along with max dose Imdur.  Only other medication we can consider adding an anginal would be calcium channel blocker.  2. PAF: Eliquis on hold, remains on IV heparin. Remains in SR.  Remains on Toprol-XL 5 mg daily.  Restart Eliquis once stable post cath/PCI.  3. CKD stage IIIb: Cr 2.13 on admission. Down to 1.99 yesterday, BMET pending this morning.   Unfortunately, creatinine did not improve much today down to 1.98 indicating that likely this may be his baseline now.  If elevated LVEDP on left heart catheter would consider right heart cath to exclude potential cardiorenal.  We will avoid LV gram and order 2D echocardiogram for better assessment of EF.  Determine potential  need for diuretic based on cath findings.  4. Orthostatic hypotension: will stop midodrine this morning as blood pressures are stable. Continue regular home BP meds.  5. HL: on statin; and fenofibrate  6. IDA: on Iron supplement  7.  Diabetes mellitus, on insulin -will return to home doses on discharge.      Signed, Reino Bellis, NP  01/07/2020, 8:37 AM    ATTENDING ATTESTATION  I have seen, examined and evaluated the patient this morning along with Reino Bellis, NP-C.  After reviewing all the available data and chart, we discussed the patients laboratory, study & physical findings as well as symptoms in detail. I agree with her findings, examination as well as impression recommendations as per our discussion.    Patient presenting here with worsening symptoms of exertional dyspnea and chest discomfort concerning for progressive angina.  He has known CAD with four-vessel CABG-all grafts patent by most recent cath December 15, 2017.  Agree with plan for cardiac catheterization.  Major issue being CKD now stable creatinine at 1.98.  He has been hydrated overnight.  Plan would be diagnostic cath, if EDP elevated, would add right heart cath.  Check 2D echocardiogram to avoid excess contrast. If severe or significant lesion, will then proceed with PCI, however if stable if lesion, would consider staged PCI after intermittent hydration.  We will determine his diuretic dosing based on findings.  Once is Procedures are complete, we will go back on Eliquis.  For PCI, would use Plavix, and consider Plavix plus Eliquis without aspirin.   Glenetta Hew, [name], M.S. Interventional Cardiologist   Pager # [phone] Phone # [phone] [address] [place], [place] [address]     For questions or updates, please contact [place] Please consult [email or web address] for contact info under

## 2020-01-07 NOTE — Progress Notes (Signed)
Pt prepped and ready for cardiac catheterization today. Resting in bed with call light within reach. VSS. Voices no pain at this time. Heparin gtt running at 14.56mL/hr as ordered. No signs of bleeding or complication noted. IV intact, patent and flushed with 53mL 0.9% NS. Pt waiting to go down for procedure. Will cont to mx.

## 2020-01-07 NOTE — Progress Notes (Signed)
Pt returned to unit. Alert and wake. VSS. Dry dressing in place over femoral access site. No signs of bleeding or hematoma noted. Pedal pulses 2+ normal bilaterally. Educated pt regarding strict bedrest until 1800. Daughter at bedside. Call light within reach. Will cont to mx closely.

## 2020-01-08 ENCOUNTER — Other Ambulatory Visit: Payer: Self-pay

## 2020-01-08 ENCOUNTER — Inpatient Hospital Stay (HOSPITAL_COMMUNITY): Payer: Medicare PPO

## 2020-01-08 DIAGNOSIS — I340 Nonrheumatic mitral (valve) insufficiency: Secondary | ICD-10-CM

## 2020-01-08 DIAGNOSIS — E1165 Type 2 diabetes mellitus with hyperglycemia: Secondary | ICD-10-CM

## 2020-01-08 DIAGNOSIS — N184 Chronic kidney disease, stage 4 (severe): Secondary | ICD-10-CM

## 2020-01-08 DIAGNOSIS — I25111 Atherosclerotic heart disease of native coronary artery with angina pectoris with documented spasm: Secondary | ICD-10-CM

## 2020-01-08 DIAGNOSIS — I4510 Unspecified right bundle-branch block: Secondary | ICD-10-CM

## 2020-01-08 DIAGNOSIS — Z951 Presence of aortocoronary bypass graft: Secondary | ICD-10-CM

## 2020-01-08 LAB — BASIC METABOLIC PANEL
Anion gap: 8 (ref 5–15)
BUN: 19 mg/dL (ref 8–23)
CO2: 23 mmol/L (ref 22–32)
Calcium: 9.1 mg/dL (ref 8.9–10.3)
Chloride: 105 mmol/L (ref 98–111)
Creatinine, Ser: 1.79 mg/dL — ABNORMAL HIGH (ref 0.61–1.24)
GFR calc Af Amer: 43 mL/min — ABNORMAL LOW (ref >60)
GFR calc non Af Amer: 37 mL/min — ABNORMAL LOW (ref >60)
Glucose, Bld: 237 mg/dL — ABNORMAL HIGH (ref 70–99)
Potassium: 4.2 mmol/L (ref 3.5–5.1)
Sodium: 136 mmol/L (ref 135–145)

## 2020-01-08 LAB — CBC
HCT: 36.3 % — ABNORMAL LOW (ref 39.0–52.0)
Hemoglobin: 12.7 g/dL — ABNORMAL LOW (ref 13.0–17.0)
MCH: 31.6 pg (ref 26.0–34.0)
MCHC: 35 g/dL (ref 30.0–36.0)
MCV: 90.3 fL (ref 80.0–100.0)
Platelets: 197 10*3/uL (ref 150–400)
RBC: 4.02 MIL/uL — ABNORMAL LOW (ref 4.22–5.81)
RDW: 13.8 % (ref 11.5–15.5)
WBC: 6.6 10*3/uL (ref 4.0–10.5)
nRBC: 0 % (ref 0.0–0.2)

## 2020-01-08 LAB — GLUCOSE, CAPILLARY
Glucose-Capillary: 142 mg/dL — ABNORMAL HIGH (ref 70–99)
Glucose-Capillary: 242 mg/dL — ABNORMAL HIGH (ref 70–99)
Glucose-Capillary: 106 mg/dL — ABNORMAL HIGH (ref 70–99)

## 2020-01-08 LAB — ECHOCARDIOGRAM COMPLETE
Height: 69 in
Weight: 3339.2 oz

## 2020-01-08 MED ORDER — AMLODIPINE BESYLATE 5 MG PO TABS
5.0000 mg | ORAL_TABLET | Freq: Every day | ORAL | Status: DC
  Administered 2020-01-08: 5 mg via ORAL
  Filled 2020-01-08: qty 1

## 2020-01-08 MED ORDER — AMLODIPINE BESYLATE 2.5 MG PO TABS: 2.5000 mg | ORAL_TABLET | Freq: Every day | ORAL | Status: DC

## 2020-01-08 MED ORDER — AMLODIPINE BESYLATE 5 MG PO TABS: 5.0000 mg | ORAL_TABLET | Freq: Every day | ORAL | 1 refills | Status: DC

## 2020-01-08 MED ORDER — APIXABAN 5 MG PO TABS
5.0000 mg | ORAL_TABLET | Freq: Two times a day (BID) | ORAL | Status: DC
  Administered 2020-01-08: 10:00:00 5 mg via ORAL
  Filled 2020-01-08: qty 1

## 2020-01-08 MED ORDER — ISOSORBIDE MONONITRATE ER 60 MG PO TB24
60.0000 mg | ORAL_TABLET | Freq: Every day | ORAL | Status: DC
  Administered 2020-01-08: 10:00:00 60 mg via ORAL
  Filled 2020-01-08: qty 1

## 2020-01-08 MED ORDER — AMLODIPINE BESYLATE 5 MG PO TABS: 5.0000 mg | ORAL_TABLET | Freq: Every day | ORAL | Status: DC

## 2020-01-08 MED FILL — AMLODIPINE BESYLATE 5 MG TA: 5 | 90 days supply | Qty: 90 | Fill #0

## 2020-01-08 NOTE — Progress Notes (Addendum)
Ambulated pt about 200 ft to check and ensure pt was pain free as well as free of any shortness of breath. Pt tolerated ambulation well with hr staying in 60s and oxygen saturation around 96%. Pt remained free of any chest pain or tightness.

## 2020-01-08 NOTE — Discharge Summary (Signed)
Discharge Summary    Patient ID: [name] [name],  MRN: [id number], DOB/AGE: [birth year] 74 y.o.  Admit date: 01/05/2020 Discharge date: 01/08/2020  Primary Care Provider: Penni Homans A Primary Cardiologist: Larae Grooms, MD  Discharge Diagnoses    Principal Problem:   Unstable angina [facility]) Active Problems:   RBBB   Coronary artery disease involving native coronary artery   Hyperlipidemia LDL goal <70   Hx of CABG   Diabetes mellitus type II, uncontrolled ([place])   CKD (chronic kidney disease), stage IV (HCC)   Allergies Allergies  Allergen Reactions  . Morphine Other (See Comments)    hallucinations  . Shellfish-Derived Products Itching    Diagnostic Studies/Procedures    Cath: 01/07/20   Right heart cath demonstrated normal filling pressures and pulmonary artery pressure.  Total occlusion of the mid LAD and diagonal.  Ramus intermedius is also totally occluded.  Mild to moderate disease is noted in the distal circumflex.  The native right coronary is totally occluded beyond the origin of the PDA.  The bypass graft to the continuation of the right coronary is widely patent.  The bypass graft to the diagonal is widely patent.  The bypass graft to the ramus intermedius is widely patent.  LIMA to the LAD is widely patent.  Left ventricular function was not assessed with this study.  Left ventricular end-diastolic pressure was normal.  RECOMMENDATIONS:   In review of images, no significant change has occurred compared to 2019.  If angina, consider microvascular origin.  Uncertain explanation for the patient's dyspnea.  Diagnostic Dominance: Right  TTE: 01/08/20  IMPRESSIONS    1. Left ventricular ejection fraction, by visual estimation, is 60 to  65%. The left ventricle has normal function. Mildly increased left  ventricular posterior wall thickness. There is mildly increased left  ventricular hypertrophy of the basal septum.  2. The left  ventricle has no regional wall motion abnormalities.  3. Left ventricular diastolic parameters are indeterminate.  4. Elevated left ventricular end-diastolic pressure.  5. Global right ventricle has normal systolic function.The right  ventricular size is normal. No increase in right ventricular wall  thickness.  6. Left atrial size was normal.  7. Right atrial size was normal.  8. The mitral valve is normal in structure. Mild mitral valve  regurgitation. No evidence of mitral stenosis.  9. The tricuspid valve is normal in structure. Tricuspid valve  regurgitation is not demonstrated.  10. The aortic valve is normal in structure. Aortic valve regurgitation is  not visualized. No evidence of aortic valve sclerosis or stenosis.  11. The pulmonic valve was normal in structure. Pulmonic valve  regurgitation is trivial.  12. The inferior vena cava is normal in size with <50% respiratory  variability, suggesting right atrial pressure of 8 mmHg.  _____________   History of Present Illness     [name] [name] is a 74 year old male with past medical history of CAD s/p CABG, PAF, PSVT, CKD stage III, noncompliance, orthostatic hypotension, iron deficiency anemia, chronic diastolic CHF, HTN, HLD, DM 2, right bundle branch block and history of kidney stone.  His last PCI was in 2009.  Cardiac catheterization in 2015, 2016 and the 2019 showed stable anatomy with patent bypass grafts, medical therapy was recommended for possible microvascular angina.  He had near syncope and transient confusion in 2017.  MRI of the head was negative for stroke. There was no significant carotid artery disease by CTA.  TIA could not be ruled out.  Therefore aspirin was switched to Plavix.  Previous heart monitor demonstrated atrial fibrillation/atrial flutter and Plavix was transitioned to Coumadin which later switched to Eliquis.  His last visit with [name] was in April 2020, at which time, he complained of worsening  dyspnea for a few months and he felt this may be related to weight gain.  Most recent echocardiogram obtained on 08/28/2019 showed EF 60 to 65%, no significant valve issue.  For the past month prior to admission, he had been noticing worsening dyspnea on exertion and exertional chest discomfort.  Although this initially only occurred with physical activity, later he started noticing chest discomfort even at rest.  Chest pain radiating down the left arm.  On the night of 01/04/2020, he started having recurrent chest pain at rest, however this time it did not go away.  He came to visit his wife who was admitted to the hospital pending discharge, due to worsening chest pain, he was sent to the ED.  EKG showed a chronic right bundle branch block, sinus bradycardia, otherwise no significant ST-T wave changes.  Lab work did show worsening renal function when compared to the previous lab on 12/27/2019.  Patient was instructed by his PCP recently to drink at least 60 to 80 ounces of fluid per day. Despite prolonged episode of chest pain, high-sensitivity troponin was 4. Given symptoms he was admitted for further work up.    Hospital Course     1. Dyspnea with CAD hx of CABG: presented with chest pain and shortness of breath over the past couple of weeks. hsTn negative. EKG with SR, RBBB and chronic associated anterior TWIs. Underwent cardiac cath noted above with 4/4 patent grafts, native disease noted. No change from prior cath. Follow up echo showed normal EF. Remains on Ranexa. Will reduce his home Imdur from 120mg  daily to 60mg  to allow for addition of amlodipine 5mg  daily.  -- continue statin, BB -- given his stable CAD suspect he may benefit from outpatient pulmonary evaluation if no improvement with medication changes.   2. PAF: Was on IV heparin until cath. Resumed on Eliquis 5mg  BID prior to discharge.   3. CKD stage IIIb: Cr 2.13 on admission. Trended down 1.99>>1.98>>1.7 at the time of discharge.  BMET at follow up appt  4. Orthostatic hypotension: reports hx of same years ago, but no issues recently. Given he was on midodrine and BP meds, midodrine was stopped this admission. Blood pressures remained stable.   5. HL: on statin and fenofibrate.  6. IDA: on Iron supplement  7.  Diabetes mellitus: on insulin while inpatient. Resume home doses of insulin on discharge.  General: Well developed, well nourished, male appearing in no acute distress. Head: Normocephalic, atraumatic.  Neck: Supple without bruits, JVD. Lungs:  Resp regular and unlabored, CTA. Heart: RRR, S1, S2, no S3, S4, or murmur; no rub. Abdomen: Soft, non-tender, non-distended with normoactive bowel sounds. Extremities: No clubbing, cyanosis, edema. Distal pedal pulses are 2+ bilaterally. Right femoral cath site stable without bruising or hematoma Neuro: Alert and oriented X 3. Moves all extremities spontaneously. Psych: Normal affect.  [facility]bell was seen by Dr. Ellyn Hack and determined stable for discharge home. Follow up in the office has been arranged. Medications are listed below.   ___________  Discharge Vitals Blood pressure (!) 146/71, pulse (!) 56, temperature 97.8 F (36.6 C), temperature source Oral, resp. rate 16, height 5\' 9"  (1.753 m), weight 94.7 kg, SpO2 96 %.  Filed Weights  01/07/20 0603 01/08/20 0147 01/08/20 0547  Weight: 95.5 kg 94.4 kg 94.7 kg    Labs & Radiologic Studies    CBC Recent Labs    01/07/20 0448 01/07/20 1318 01/07/20 1319 01/08/20 0316  WBC 6.1  --   --  6.6  HGB 12.8*   < > 11.9* 12.7*  HCT 37.3*   < > 35.0* 36.3*  MCV 92.1  --   --  90.3  PLT 196  --   --  197   < > = values in this interval not displayed.   Basic Metabolic Panel Recent Labs    01/07/20 0714 01/07/20 1318 01/07/20 1319 01/08/20 0316  NA 141   < > 140 136  K 4.2   < > 4.2 4.2  CL 109  --   --  105  CO2 22  --   --  23  GLUCOSE 209*  --   --  237*  BUN 19  --   --  19    CREATININE 1.98*  --   --  1.79*  CALCIUM 9.1  --   --  9.1   < > = values in this interval not displayed.   Liver Function Tests Recent Labs    01/05/20 1234  AST 30  ALT 31  ALKPHOS 48  BILITOT 1.1  PROT 7.1  ALBUMIN 4.1   No results for input(s): LIPASE, AMYLASE in the last 72 hours. Cardiac Enzymes No results for input(s): CKTOTAL, CKMB, CKMBINDEX, TROPONINI in the last 72 hours. BNP Invalid input(s): POCBNP D-Dimer No results for input(s): DDIMER in the last 72 hours. Hemoglobin A1C No results for input(s): HGBA1C in the last 72 hours. Fasting Lipid Panel No results for input(s): CHOL, HDL, LDLCALC, TRIG, CHOLHDL, LDLDIRECT in the last 72 hours. Thyroid Function Tests No results for input(s): TSH, T4TOTAL, T3FREE, THYROIDAB in the last 72 hours.  Invalid input(s): FREET3 _____________  CARDIAC CATHETERIZATION  Result Date: 01/07/2020  Right heart cath demonstrated normal filling pressures and pulmonary artery pressure.  Total occlusion of the mid LAD and diagonal.  Ramus intermedius is also totally occluded.  Mild to moderate disease is noted in the distal circumflex.  The native right coronary is totally occluded beyond the origin of the PDA.  The bypass graft to the continuation of the right coronary is widely patent.  The bypass graft to the diagonal is widely patent.  The bypass graft to the ramus intermedius is widely patent.  LIMA to the LAD is widely patent.  Left ventricular function was not assessed with this study.  Left ventricular end-diastolic pressure was normal. RECOMMENDATIONS:  In review of images, no significant change has occurred compared to 2019.  If angina, consider microvascular origin.  Uncertain explanation for the patient's dyspnea.  US RENAL  Result Date: 01/05/2020 CLINICAL DATA:  Chronic renal disease. EXAM: RENAL / URINARY TRACT ULTRASOUND COMPLETE COMPARISON:  None. FINDINGS: Right Kidney: Renal measurements: 9.5 x 4.6 x 4.9 cm =  volume: 110 mL . Echogenicity within normal limits. No mass or hydronephrosis visualized. Left Kidney: Renal measurements: 9.9 x 5.5 x 5.6 cm = volume: 159.0 mL. Echogenicity within normal limits. No mass or hydronephrosis visualized. Bladder: Appears normal for degree of bladder distention. Other: None. IMPRESSION: No cause for chronic renal disease identified.  Normal study. Electronically Signed   By: Dorise Bullion III M.D   On: 01/05/2020 19:56   DG Chest Portable 1 View  Result Date: 01/05/2020 CLINICAL DATA:  Left-sided  chest pain.  Shortness of breath. EXAM: PORTABLE CHEST 1 VIEW COMPARISON:  August 20, 2019 FINDINGS: Stable cardiomegaly. The hila and mediastinum are normal. No pneumothorax. No nodules or masses. No focal infiltrates. IMPRESSION: No active disease. Electronically Signed   By: Dorise Bullion III M.D   On: 01/05/2020 13:10   ECHOCARDIOGRAM COMPLETE  Result Date: 01/08/2020   ECHOCARDIOGRAM REPORT   Patient Name:   DAJOUR PIERPOINT Date of Exam: 01/08/2020 Medical Rec #:  [id number]         Height:       69.0 in Accession #:    [id number]        Weight:       208.7 lb Date of Birth:  03-22-1946         BSA:          2.10 m Patient Age:    61 years          BP:           132/64 mmHg Patient Gender: M                 HR:           59 bpm. Exam Location:  Inpatient Procedure: 2D Echo, Cardiac Doppler and Color Doppler Indications:    R06.02 SOB  History:        Patient has prior history of Echocardiogram examinations, most                 recent 08/29/2019. CHF, CAD, Prior CABG and Abnormal ECG,                 Arrythmias:RBBB and Atrial Fibrillation, Signs/Symptoms:Dyspnea,                 Chest Pain and Syncope; Risk Factors:Hypertension, Diabetes and                 Sleep Apnea.  Sonographer:    Roseanna Rainbow RDCS Referring Phys: [place]  1. Left ventricular ejection fraction, by visual estimation, is 60 to 65%. The left ventricle has normal function. Mildly  increased left ventricular posterior wall thickness. There is mildly increased left ventricular hypertrophy of the basal septum.  2. The left ventricle has no regional wall motion abnormalities.  3. Left ventricular diastolic parameters are indeterminate.  4. Elevated left ventricular end-diastolic pressure.  5. Global right ventricle has normal systolic function.The right ventricular size is normal. No increase in right ventricular wall thickness.  6. Left atrial size was normal.  7. Right atrial size was normal.  8. The mitral valve is normal in structure. Mild mitral valve regurgitation. No evidence of mitral stenosis.  9. The tricuspid valve is normal in structure. Tricuspid valve regurgitation is not demonstrated. 10. The aortic valve is normal in structure. Aortic valve regurgitation is not visualized. No evidence of aortic valve sclerosis or stenosis. 11. The pulmonic valve was normal in structure. Pulmonic valve regurgitation is trivial. 12. The inferior vena cava is normal in size with <50% respiratory variability, suggesting right atrial pressure of 8 mmHg. FINDINGS  Left Ventricle: Left ventricular ejection fraction, by visual estimation, is 65 to 70%. The left ventricle has normal function. The left ventricle has no regional wall motion abnormalities. Mildly increased left ventricular posterior wall thickness. There is mildly increased left ventricular hypertrophy of the basal septum. Left ventricular diastolic parameters are indeterminate. Right Ventricle: The right ventricular size is normal. No increase in right ventricular  wall thickness. Global RV systolic function is has normal systolic function. Left Atrium: Left atrial size was normal in size. Right Atrium: Right atrial size was normal in size Pericardium: There is no evidence of pericardial effusion. Mitral Valve: The mitral valve is normal in structure. Mild mitral valve regurgitation. No evidence of mitral valve stenosis by observation.  Tricuspid Valve: The tricuspid valve is normal in structure. Tricuspid valve regurgitation is not demonstrated. Aortic Valve: The aortic valve is normal in structure. Aortic valve regurgitation is not visualized. The aortic valve is structurally normal, with no evidence of sclerosis or stenosis. Pulmonic Valve: The pulmonic valve was normal in structure. Pulmonic valve regurgitation is trivial. Pulmonic regurgitation is trivial. Aorta: The aortic root, ascending aorta and aortic arch are all structurally normal, with no evidence of dilitation or obstruction. Venous: The inferior vena cava is normal in size with less than 50% respiratory variability, suggesting right atrial pressure of 8 mmHg. IAS/Shunts: No atrial level shunt detected by color flow Doppler. There is no evidence of a patent foramen ovale. No ventricular septal defect is seen or detected. There is no evidence of an atrial septal defect.  LEFT VENTRICLE PLAX 2D LVIDd:         4.60 cm       Diastology LVIDs:         3.00 cm       LV e' lateral:   9.14 cm/s LV PW:         1.20 cm       LV E/e' lateral: 11.2 LV IVS:        1.40 cm       LV e' medial:    7.18 cm/s LVOT diam:     2.10 cm       LV E/e' medial:  14.2 LV SV:         62 ml LV SV Index:   28.68 LVOT Area:     3.46 cm  LV Volumes (MOD) LV area d, A2C:    25.70 cm LV area d, A4C:    28.10 cm LV area s, A2C:    13.60 cm LV area s, A4C:    16.40 cm LV major d, A2C:   7.95 cm LV major d, A4C:   6.97 cm LV major s, A2C:   6.35 cm LV major s, A4C:   6.40 cm LV vol d, MOD A2C: 70.8 ml LV vol d, MOD A4C: 92.8 ml LV vol s, MOD A2C: 24.7 ml LV vol s, MOD A4C: 37.2 ml LV SV MOD A2C:     46.1 ml LV SV MOD A4C:     92.8 ml LV SV MOD BP:      56.2 ml RIGHT VENTRICLE            IVC RV S prime:     8.70 cm/s  IVC diam: 2.20 cm TAPSE (M-mode): 1.9 cm LEFT ATRIUM             Index       RIGHT ATRIUM           Index LA diam:        4.10 cm 1.95 cm/m  RA Area:     18.90 cm LA Vol (A2C):   49.7 ml 23.62 ml/m  RA Volume:   49.10 ml  23.34 ml/m LA Vol (A4C):   64.5 ml 30.66 ml/m LA Biplane Vol: 58.4 ml 27.76 ml/m  AORTIC VALVE LVOT Vmax:   97.50 cm/s  LVOT Vmean:  66.800 cm/s LVOT VTI:    0.244 m  AORTA Ao Root diam: 3.60 cm Ao Asc diam:  3.20 cm MITRAL VALVE MV Area (PHT): 3.65 cm              SHUNTS MV PHT:        60.32 msec            Systemic VTI:  0.24 m MV Decel Time: 208 msec              Systemic Diam: 2.10 cm MV E velocity: 102.00 cm/s 103 cm/s MV A velocity: 70.30 cm/s  70.3 cm/s MV E/A ratio:  1.45        1.5  Fransico Him MD Electronically signed by Fransico Him MD Signature Date/Time: 01/08/2020/11:16:21 AM    Final    Disposition   Pt is being discharged home today in good condition.  Follow-up Plans & Appointments    Follow-up Information    Imogene Burn, PA-C Follow up on 01/22/2020.   Specialty: Cardiology Why: at 11:45am for your follow up appt.  Contact information: [address] [place] [phone]          Discharge Instructions    Call MD for:  redness, tenderness, or signs of infection (pain, swelling, redness, odor or green/yellow discharge around incision site)   Complete by: As directed    Diet - low sodium heart healthy   Complete by: As directed    Discharge instructions   Complete by: As directed    Groin Site Care Refer to this sheet in the next few weeks. These instructions provide you with information on caring for yourself after your procedure. Your caregiver may also give you more specific instructions. Your treatment has been planned according to current medical practices, but problems sometimes occur. Call your caregiver if you have any problems or questions after your procedure. HOME CARE INSTRUCTIONS You may shower 24 hours after the procedure. Remove the bandage (dressing) and gently wash the site with plain soap and water. Gently pat the site dry.  Do not apply powder or lotion to the site.  Do not sit in a bathtub,  swimming pool, or whirlpool for 5 to 7 days.  No bending, squatting, or lifting anything over 10 pounds (4.5 kg) as directed by your caregiver.  Inspect the site at least twice daily.  Do not drive home if you are discharged the same day of the procedure. Have someone else drive you.  You may drive 24 hours after the procedure unless otherwise instructed by your caregiver.  What to expect: Any bruising will usually fade within 1 to 2 weeks.  Blood that collects in the tissue (hematoma) may be painful to the touch. It should usually decrease in size and tenderness within 1 to 2 weeks.  SEEK IMMEDIATE MEDICAL CARE IF: You have unusual pain at the groin site or down the affected leg.  You have redness, warmth, swelling, or pain at the groin site.  You have drainage (other than a small amount of blood on the dressing).  You have chills.  You have a fever or persistent symptoms for more than 72 hours.  You have a fever and your symptoms suddenly get worse.  Your leg becomes pale, cool, tingly, or numb.  You have heavy bleeding from the site. Hold pressure on the site. .   Increase activity slowly   Complete by: As directed  Discharge Medications     Medication List    STOP taking these medications   diclofenac sodium 1 % Gel Commonly known as: VOLTAREN   isosorbide mononitrate 60 MG 24 hr tablet Commonly known as: IMDUR   loperamide 2 MG tablet Commonly known as: Imodium A-D   midodrine 10 MG tablet Commonly known as: PROAMATINE     TAKE these medications   albuterol 108 (90 Base) MCG/ACT inhaler Commonly known as: VENTOLIN HFA Inhale 2 puffs into the lungs every 6 (six) hours as needed for wheezing or shortness of breath.   amLODipine 5 MG tablet Commonly known as: NORVASC Take 1 tablet (5 mg total) by mouth daily. Start taking on: January 09, 2020   atorvastatin 80 MG tablet Commonly known as: LIPITOR Take 1 tablet (80 mg total) by mouth daily.   divalproex 250  MG 24 hr tablet Commonly known as: Depakote ER Take 3 tablets (750 mg total) by mouth at bedtime.   Eliquis 5 MG Tabs tablet Generic drug: apixaban TAKE 1 TABLET (5 MG TOTAL) BY MOUTH 2 (TWO) TIMES DAILY. What changed: See the new instructions.   fenofibrate 145 MG tablet Commonly known as: TRICOR Take 1 tablet (145 mg total) by mouth daily.   ferrous sulfate 325 (65 FE) MG tablet Take 1 tablet (325 mg total) by mouth every other day.   FreeStyle Libre 14 Day Sensor Misc 1 Device by Does not apply route every 14 (fourteen) days.   furosemide 20 MG tablet Commonly known as: LASIX TAKE 1 TABLET BY MOUTH DAILY AS NEEDED FOR EDEMA What changed: See the new instructions.   gabapentin 100 MG capsule Commonly known as: Neurontin Take 1 in the morning, take 2 in the evening   glucose blood test strip Commonly known as: Accu-Chek Aviva Use as directed to check blood sugar 4 times daily.  DX E11.9   insulin NPH Human 100 UNIT/ML injection Commonly known as: NOVOLIN N Inject 0.8 mLs (80 Units total) into the skin at bedtime.   Insulin Pen Needle 31G X 8 MM Misc 1 each by Does not apply route 3 (three) times daily. E11.9   INSULIN SYRINGE 1CC/29G 29G X 1/2" 1 ML Misc 1 each by Does not apply route daily. Use to inject insulin daily   levothyroxine 100 MCG tablet Commonly known as: SYNTHROID TAKE 1 TABLET (100 MCG TOTAL) BY MOUTH DAILY.   metoprolol succinate 50 MG 24 hr tablet Commonly known as: TOPROL-XL TAKE 1&1/2 TABLETS BY MOUTH ONCE A DAY FOR A TOTAL OF 75 MG. What changed:   how much to take  how to take this  when to take this  additional instructions   nitroGLYCERIN 0.4 MG SL tablet Commonly known as: NITROSTAT PLACE 1 TABLET UNDER THE TONGUE EVERY 5 MINUTES AS NEEDED FOR CHEST PAIN. What changed: See the new instructions.   NovoLOG FlexPen 100 UNIT/ML FlexPen Generic drug: insulin aspart 3 times a day (just before each meal) 65-80-130 units     pantoprazole 40 MG tablet Commonly known as: PROTONIX TAKE 1 TABLET EVERY DAY   ranolazine 500 MG 12 hr tablet Commonly known as: RANEXA TAKE 1 TABLET (500 MG TOTAL) BY MOUTH 2 (TWO) TIMES DAILY.   rOPINIRole 1 MG tablet Commonly known as: Requip Take 1 tablet (1 mg total) by mouth at bedtime.   sertraline 100 MG tablet Commonly known as: ZOLOFT TAKE 1 TABLET (100 MG TOTAL) BY MOUTH 2 (TWO) TIMES DAILY.   tiZANidine 4 MG tablet  Commonly known as: Zanaflex Take 1 tablet (4 mg total) by mouth every 6 (six) hours as needed for muscle spasms.   Vitamin D (Ergocalciferol) 1.25 MG (50000 UNIT) Caps capsule Commonly known as: DRISDOL Take 1 capsule (50,000 Units total) by mouth every 7 (seven) days.        No                               Did the patient have a percutaneous coronary intervention (stent / angioplasty)?:  No.    Outstanding Labs/Studies   BMET at follow up appt.  Duration of Discharge Encounter   Greater than 30 minutes including physician time.  Signed, Reino Bellis NP-C 01/08/2020, 11:52 AM

## 2020-01-08 NOTE — Progress Notes (Signed)
  Echocardiogram 2D Echocardiogram has been performed.  [name] [name] 01/08/2020, 8:49 AM

## 2020-01-08 NOTE — Discharge Instructions (Signed)
Chronic Kidney Disease, Adult Chronic kidney disease (CKD) happens when the kidneys are damaged over a long period of time. The kidneys are two organs that help with:  Getting rid of waste and extra fluid from the blood.  Making hormones that maintain the amount of fluid in your tissues and blood vessels.  Making sure that the body has the right amount of fluids and chemicals. Most of the time, CKD does not go away, but it can usually be controlled. Steps must be taken to slow down the kidney damage or to stop it from getting worse. If this is not done, the kidneys may stop working. Follow these instructions at home: Medicines  Take over-the-counter and prescription medicines only as told by your doctor. You may need to change the amount of medicines you take.  Do not take any new medicines unless your doctor says it is okay. Many medicines can make your kidney damage worse.  Do not take any vitamin and supplements unless your doctor says it is okay. Many vitamins and supplements can make your kidney damage worse. General instructions  Follow a diet as told by your doctor. You may need to stay away from: ? Alcohol. ? Salty foods. ? Foods that are high in:  Potassium.  Calcium.  Protein.  Do not use any products that contain nicotine or tobacco, such as cigarettes and e-cigarettes. If you need help quitting, ask your doctor.  Keep track of your blood pressure at home. Tell your doctor about any changes.  If you have diabetes, keep track of your blood sugar as told by your doctor.  Try to stay at a healthy weight. If you need help, ask your doctor.  Exercise at least 30 minutes a day, 5 days a week.  Stay up-to-date with your shots (immunizations) as told by your doctor.  Keep all follow-up visits as told by your doctor. This is important. Contact a doctor if:  Your symptoms get worse.  You have new symptoms. Get help right away if:  You have symptoms of end-stage  kidney disease. These may include: ? Headaches. ? Numbness in your hands or feet. ? Easy bruising. ? Having hiccups often. ? Chest pain. ? Shortness of breath. ? Stopping of menstrual periods in women.  You have a fever.  You have very little pee (urine).  You have pain or bleeding when you pee. Summary  Chronic kidney disease (CKD) happens when the kidneys are damaged over a long period of time.  Most of the time, this condition does not go away, but it can usually be controlled. Steps must be taken to slow down the kidney damage or to stop it from getting worse.  Treatment may include a combination of medicines and lifestyle changes. This information is not intended to replace advice given to you by your health care provider. Make sure you discuss any questions you have with your health care provider. Document Revised: 11/04/2017 Document Reviewed: 12/27/2016 Elsevier Patient Education  [place]. Femoral Site Care This sheet gives you information about how to care for yourself after your procedure. Your health care provider may also give you more specific instructions. If you have problems or questions, contact your health care provider. What can I expect after the procedure? After the procedure, it is common to have:  Bruising that usually fades within 1-2 weeks.  Tenderness at the site. Follow these instructions at home: Wound care  Follow instructions from your health care provider about how  to take care of your insertion site. Make sure you: ? Wash your hands with soap and water before you change your bandage (dressing). If soap and water are not available, use hand sanitizer. ? Change your dressing as told by your health care provider. ? Leave stitches (sutures), skin glue, or adhesive strips in place. These skin closures may need to stay in place for 2 weeks or longer. If adhesive strip edges start to loosen and curl up, you may trim the loose edges. Do not  remove adhesive strips completely unless your health care provider tells you to do that.  Do not take baths, swim, or use a hot tub until your health care provider approves.  You may shower 24-48 hours after the procedure or as told by your health care provider. ? Gently wash the site with plain soap and water. ? Pat the area dry with a clean towel. ? Do not rub the site. This may cause bleeding.  Do not apply powder or lotion to the site. Keep the site clean and dry.  Check your femoral site every day for signs of infection. Check for: ? Redness, swelling, or pain. ? Fluid or blood. ? Warmth. ? Pus or a bad smell. Activity  For the first 2-3 days after your procedure, or as long as directed: ? Avoid climbing stairs as much as possible. ? Do not squat.  Do not lift anything that is heavier than 10 lb (4.5 kg), or the limit that you are told, until your health care provider says that it is safe.  Rest as directed. ? Avoid sitting for a long time without moving. Get up to take short walks every 1-2 hours.  Do not drive for 24 hours if you were given a medicine to help you relax (sedative). General instructions  Take over-the-counter and prescription medicines only as told by your health care provider.  Keep all follow-up visits as told by your health care provider. This is important. Contact a health care provider if you have:  A fever or chills.  You have redness, swelling, or pain around your insertion site. Get help right away if:  The catheter insertion area swells very fast.  You pass out.  You suddenly start to sweat or your skin gets clammy.  The catheter insertion area is bleeding, and the bleeding does not stop when you hold steady pressure on the area.  The area near or just beyond the catheter insertion site becomes pale, cool, tingly, or numb. These symptoms may represent a serious problem that is an emergency. Do not wait to see if the symptoms will go  away. Get medical help right away. Call your local emergency services (911 in the [place] Do not drive yourself to the hospital. Summary  After the procedure, it is common to have bruising that usually fades within 1-2 weeks.  Check your femoral site every day for signs of infection.  Do not lift anything that is heavier than 10 lb (4.5 kg), or the limit that you are told, until your health care provider says that it is safe. This information is not intended to replace advice given to you by your health care provider. Make sure you discuss any questions you have with your health care provider. Document Revised: 12/05/2017 Document Reviewed: 12/05/2017 Elsevier Patient Education  2020 [employer].

## 2020-01-08 NOTE — Progress Notes (Signed)
NURSING PROGRESS NOTE  [name] [name] [id number] Discharge Data: 01/08/2020 12:37 PM Attending Provider: Jerline Pain, MD [id number], Bonnita Levan, MD     Cinda Quest Walberg to be D/C'd Home per MD order.  Discussed with the patient the After Visit Summary and all questions fully answered. All IV's discontinued with no bleeding noted. All belongings returned to patient for patient to take home. Pt tolerated ambulation well and remained free of pain, chest tightness, sob, or discomfort.Provider to be paged regarding communicating the patient's Echo results. Pt waiting for [facility] from [facility]  & then will be discharged.Pt to be taken downstairs via wheelchair accompanied by a staff member.  Last Vital Signs:  Blood pressure (!) 146/71, pulse (!) 56, temperature 97.8 F (36.6 C), temperature source Oral, resp. rate 16, height 5\' 9"  (1.753 m), weight 94.7 kg, SpO2 96 %.  Discharge Medication List Allergies as of 01/08/2020      Reactions   Morphine Other (See Comments)   hallucinations   Shellfish-derived Products Itching      Medication List    STOP taking these medications   diclofenac sodium 1 % Gel Commonly known as: VOLTAREN   isosorbide mononitrate 60 MG 24 hr tablet Commonly known as: IMDUR   loperamide 2 MG tablet Commonly known as: Imodium A-D   midodrine 10 MG tablet Commonly known as: PROAMATINE     TAKE these medications   albuterol 108 (90 Base) MCG/ACT inhaler Commonly known as: VENTOLIN HFA Inhale 2 puffs into the lungs every 6 (six) hours as needed for wheezing or shortness of breath.   amLODipine 5 MG tablet Commonly known as: NORVASC Take 1 tablet (5 mg total) by mouth daily. Start taking on: January 09, 2020   atorvastatin 80 MG tablet Commonly known as: LIPITOR Take 1 tablet (80 mg total) by mouth daily.   divalproex 250 MG 24 hr tablet Commonly known as: Depakote ER Take 3 tablets (750 mg total) by mouth at bedtime.   Eliquis 5 MG Tabs  tablet Generic drug: apixaban TAKE 1 TABLET (5 MG TOTAL) BY MOUTH 2 (TWO) TIMES DAILY. What changed: See the new instructions.   fenofibrate 145 MG tablet Commonly known as: TRICOR Take 1 tablet (145 mg total) by mouth daily.   ferrous sulfate 325 (65 FE) MG tablet Take 1 tablet (325 mg total) by mouth every other day.   FreeStyle Libre 14 Day Sensor Misc 1 Device by Does not apply route every 14 (fourteen) days.   furosemide 20 MG tablet Commonly known as: LASIX TAKE 1 TABLET BY MOUTH DAILY AS NEEDED FOR EDEMA What changed: See the new instructions.   gabapentin 100 MG capsule Commonly known as: Neurontin Take 1 in the morning, take 2 in the evening   glucose blood test strip Commonly known as: Accu-Chek Aviva Use as directed to check blood sugar 4 times daily.  DX E11.9   insulin NPH Human 100 UNIT/ML injection Commonly known as: NOVOLIN N Inject 0.8 mLs (80 Units total) into the skin at bedtime.   Insulin Pen Needle 31G X 8 MM Misc 1 each by Does not apply route 3 (three) times daily. E11.9   INSULIN SYRINGE 1CC/29G 29G X 1/2" 1 ML Misc 1 each by Does not apply route daily. Use to inject insulin daily   levothyroxine 100 MCG tablet Commonly known as: SYNTHROID TAKE 1 TABLET (100 MCG TOTAL) BY MOUTH DAILY.   metoprolol succinate 50 MG 24 hr tablet Commonly known as: TOPROL-XL TAKE  1&1/2 TABLETS BY MOUTH ONCE A DAY FOR A TOTAL OF 75 MG. What changed:   how much to take  how to take this  when to take this  additional instructions   nitroGLYCERIN 0.4 MG SL tablet Commonly known as: NITROSTAT PLACE 1 TABLET UNDER THE TONGUE EVERY 5 MINUTES AS NEEDED FOR CHEST PAIN. What changed: See the new instructions.   NovoLOG FlexPen 100 UNIT/ML FlexPen Generic drug: insulin aspart 3 times a day (just before each meal) 65-80-130 units   pantoprazole 40 MG tablet Commonly known as: PROTONIX TAKE 1 TABLET EVERY DAY   ranolazine 500 MG 12 hr tablet Commonly known  as: RANEXA TAKE 1 TABLET (500 MG TOTAL) BY MOUTH 2 (TWO) TIMES DAILY.   rOPINIRole 1 MG tablet Commonly known as: Requip Take 1 tablet (1 mg total) by mouth at bedtime.   sertraline 100 MG tablet Commonly known as: ZOLOFT TAKE 1 TABLET (100 MG TOTAL) BY MOUTH 2 (TWO) TIMES DAILY.   tiZANidine 4 MG tablet Commonly known as: Zanaflex Take 1 tablet (4 mg total) by mouth every 6 (six) hours as needed for muscle spasms.   Vitamin D (Ergocalciferol) 1.25 MG (50000 UNIT) Caps capsule Commonly known as: DRISDOL Take 1 capsule (50,000 Units total) by mouth every 7 (seven) days.

## 2020-01-08 NOTE — Progress Notes (Signed)
Patient complained of burning in chest "a little while ago", at this time patient states it is gone.  Notified Reino Bellis, NP who stated to ambulate patient in hall.  RN will update her post walk.

## 2020-01-10 ENCOUNTER — Other Ambulatory Visit: Payer: Self-pay | Admitting: Family Medicine

## 2020-01-10 ENCOUNTER — Ambulatory Visit: Payer: Medicare PPO | Admitting: Endocrinology

## 2020-01-10 MED FILL — FENOFIBRATE 145 MG TABS: 145 | 30 days supply | Qty: 30 | Fill #0

## 2020-01-10 MED FILL — FREESTYLE LIBRE 14 DAY SENS: 28 days supply | Qty: 2 | Fill #4

## 2020-01-10 NOTE — Telephone Encounter (Signed)
Last OV 12/27/19 Last refill 08/23/19 #30/3 Next OV 03/25/20

## 2020-01-14 NOTE — Telephone Encounter (Signed)
Patient is scheduled with Ermalinda Barrios, PA on 2/16

## 2020-01-16 MED FILL — NOVOLOG FLEXPEN SYRINGE: 100 | 15 days supply | Qty: 30 | Fill #5

## 2020-01-21 ENCOUNTER — Other Ambulatory Visit: Payer: Self-pay | Admitting: Family Medicine

## 2020-01-21 NOTE — Progress Notes (Signed)
Cardiology Office Note    Date:  01/22/2020   ID:  [name] [name], DOB [birth year], MRN [id number]  PCP:  [name], MD  Cardiologist: Larae Grooms, MD EPS: None  Chief Complaint  Patient presents with  . Hospitalization Follow-up    History of Present Illness:  [name] [name] is a 74 y.o. male with past medical history of CAD s/p CABG 2000, DES to the RCA 2009, PAF, PSVT, CKD stage III, noncompliance, orthostatic hypotension, iron deficiency,anemia,chronic diastolic CHF, HTN,  HLD, DM 2, right bundle branch block and history of kidney stone.  Cardiac catheterization in 2015, 2016 and the 2019 showed stable anatomy with patent bypass grafts, medical therapy was recommended for possible microvascular angina.  He had near syncope and transient confusion in 2017.  MRI of the head was negative for stroke. There was no significant carotid artery disease by CTA.  TIA could not be ruled out.  Therefore aspirin was switched to Plavix.  Previous heart monitor demonstrated atrial fibrillation/atrial flutter and Plavix was transitioned to Coumadin which later switched to Eliquis.  His last visit with [name] was in April 2020, at which time, he complained of worsening dyspnea for a few months and he felt this may be related to weight gain.  Most recent echocardiogram obtained on 08/28/2019 showed EF 60 to 65%, no significant valve issue.   Patient was admitted to the hospital with unstable angina 01/04/2020.  Troponins were negative EKG unchanged.  Cardiac cath 4 out of 4 patent grafts.  No change from prior cath.  Echo with normal LVEF.  Was felt he may have microvascular angina.  Imdur was reduced to allow the addition of amlodipine.  Pulmonary evaluation was recommended if no improvement with medication changes.  Creatinine was 2.13 on admission 1.7 at discharge.  Will need be met today.  Midodrine was stopped for prior history of orthostatic hypotension.  Patient comes in for  hospital f/u. No further chest pain. Has had some back pain. Has some dyspnea on exertion when he takes the dog out but not as bad as it was. Did clear up initially after coming home but starting to notice more shortness of breath again.  Discharge summary says Imdur was reduced to 60 but then discharge instructions say it was stopped.  Patient said this medicine has helped him more than anything in the past.  He has had no dizziness or hypotension.    Past Medical History:  Diagnosis Date  . Acquired atrophy of thyroid 06/05/2013   Overview:  July 2014: controlled on synthroid 60mg since 2012 May 2015: decreased to Synthroid 50 mcg  Last Assessment & Plan:  Pt doing well with most recent TSH within normal range.  Will continue current dosage of Synthroid 543m. Pt reminded to take medication on empty stomach. Will continue to monitor with periodic laboratory assessment in 3 months.   . Anemia    hemoglobin 7.4, iron deficiency, January, 2011, 2 unit transfusion, endoscopy normal, capsule endoscopy February, 2011 no small bowel abnormalities.   Most likely source gastric erosions, followed by GI  . Anxiety   . Bradycardia   . CAD (coronary artery disease)    A. CABG in 2000,status post cardiac cath in 2006, 2009 ....continued chest pain and SOB despite oral medication adjestments including Ranexa. B. Cath November 2009/ mRCA - 2.75 x 23 Abbott Xience V drug-eluting stent ...11/26/2008 to distal  RCA leading to acute marginal.  C. Cath 07/2012 for CP -  stable anatomy, med rx. d. cath 2015 and 05/30/2015 stable anatomy, consider Myoview if has CP again  . Carotid artery disease ([place]) 08/01/2015   Doppler, May 29, 2015, 1-39% bilateral ICA   . Cerebral ischemia    MRI November, 2010, chronic microvascular ischemia  . CKD (chronic kidney disease), stage III   . Concussion   . Depression    Bipolar  . Diabetes mellitus ([place])   . Edema   . Essential hypertension 06/05/2013   Overview:  July 2014:  Controlled with Bystolic Sep 6195: Changed to atenolol.  Last Assessment & Plan:  Will change to atenolol for cost and follow Improved.  Medication compliance strongly encouraged BP: 116/64 mmHg    Overview:  July 2014: Controlled with Bystolic Sep 0932: Changed to atenolol.  Last Assessment & Plan:  Will change to atenolol for cost and follow  . Falling episodes    these have occurred in the past and again recurring 2011  . Family history of adverse reaction to anesthesia    "mother died during bypass surgery but not sure if it has to do with anesthesia"  . Gastric ulcer   . GERD (gastroesophageal reflux disease)   . H/O medication noncompliance    Due to loss of insurance  . H/O multiple concussions   . Hard of hearing   . History of blood transfusion 12/20/2013  . Hx of CABG    2000,  / one median sternotomy suture broken her chest x-ray November, 2010, no clinical significance  . Hyperlipidemia   . Hypertension    pt. denies  . Iron deficiency anemia   . Long term (current) use of anticoagulants [Z79.01] 07/20/2016  . Low back pain 06/12/2009   Qualifier: Diagnosis of  By: [name]   . Nephrolithiasis   . Orthostasis   . OSA (obstructive sleep apnea)   . PAF (paroxysmal atrial fibrillation) ([place])    a. dx 2017, started on amiodarone/warfarin but patient intermittently noncompliant.  Marland Kitchen PSVT (paroxysmal supraventricular tachycardia) ([place])   . RBBB 07/09/2009   Qualifier: Diagnosis of  By: [name], MD, [name] [name]   . RLS (restless legs syndrome) 09/19/2009   Qualifier: Diagnosis of  By: [name]   Overview:  July 2014: Controlled with Mirapex  Last Assessment & Plan:  Patient is doing well. Will continue current management and follow clinically.  . Seizure disorder ([place]) 01/09/2017  . Spondylosis    C5-6, C6-7 MRI 2010  . Syncope 03/2016  . Thyroid disease   . Tubulovillous adenoma of colon 2007  . Vitamin D deficiency 05/17/2017  . Wears glasses     Past  Surgical History:  Procedure Laterality Date  . ANTERIOR CERVICAL DECOMP/DISCECTOMY FUSION N/A 05/31/2016   Procedure: ANTERIOR CERVICAL DECOMPRESSION/DISCECTOMY FUSION CERVICAL FIVE-SIX,CERVICAL SIX-SEVEN;  Surgeon: [name], MD;  Location: [facility];  Service: Neurosurgery;  Laterality: N/A;  . CARDIAC CATHETERIZATION N/A 05/30/2015   Procedure: Left Heart Cath and Coronary Angiography;  Surgeon: [name], MD;  Location: [facility];  Service: Cardiovascular;  Laterality: N/A;  . COLONOSCOPY    . CORONARY ARTERY BYPASS GRAFT     2000  . ESOPHAGOGASTRODUODENOSCOPY    . LEFT HEART CATH AND CORS/GRAFTS ANGIOGRAPHY N/A 12/15/2017   Procedure: LEFT HEART CATH AND CORS/GRAFTS ANGIOGRAPHY;  Surgeon: [name], MD;  Location: [facility];  Service: Cardiovascular;  Laterality: N/A;  . LEFT HEART CATHETERIZATION WITH CORONARY/GRAFT ANGIOGRAM N/A 08/01/2012  Procedure: LEFT HEART CATHETERIZATION WITH Beatrix Fetters;  Surgeon: [name], MD;  Location: [facility];  Service: Cardiovascular;  Laterality: N/A;  . LEFT HEART CATHETERIZATION WITH CORONARY/GRAFT ANGIOGRAM N/A 01/03/2015   Procedure: LEFT HEART CATHETERIZATION WITH Beatrix Fetters;  Surgeon: [name], MD;  Location: [facility] CATH LAB;  Service: Cardiovascular;  Laterality: N/A;  . NASAL SEPTUM SURGERY     UP3  . PERCUTANEOUS CORONARY STENT INTERVENTION (PCI-S)  10/2008   mRCA PCI  2.75 x 23 Abbott Xience V drug-eluting stent   . RIGHT/LEFT HEART CATH AND CORONARY/GRAFT ANGIOGRAPHY N/A 01/07/2020   Procedure: RIGHT/LEFT HEART CATH AND CORONARY/GRAFT ANGIOGRAPHY;  Surgeon: [name], MD;  Location: [facility];  Service: Cardiovascular;  Laterality: N/A;  . ULTRASOUND GUIDANCE FOR VASCULAR ACCESS  12/15/2017   Procedure: Ultrasound Guidance For Vascular Access;  Surgeon: [name], MD;  Location: [facility];  Service: Cardiovascular;;    Current  Medications: Current Meds  Medication Sig  . albuterol (VENTOLIN HFA) 108 (90 Base) MCG/ACT inhaler Inhale 2 puffs into the lungs every 6 (six) hours as needed for wheezing or shortness of breath.  Marland Kitchen amLODipine (NORVASC) 5 MG tablet Take 1 tablet (5 mg total) by mouth daily.  Marland Kitchen atorvastatin (LIPITOR) 80 MG tablet Take 1 tablet (80 mg total) by mouth daily.  . Continuous Blood Gluc Sensor (FREESTYLE LIBRE 14 DAY SENSOR) MISC 1 Device by Does not apply route every 14 (fourteen) days.  . divalproex (DEPAKOTE ER) 250 MG 24 hr tablet Take 3 tablets (750 mg total) by mouth at bedtime.  Marland Kitchen ELIQUIS 5 MG TABS tablet TAKE 1 TABLET (5 MG TOTAL) BY MOUTH 2 (TWO) TIMES DAILY. (Patient taking differently: Take 5 mg by mouth 2 (two) times daily. )  . fenofibrate (TRICOR) 145 MG tablet TAKE 1 TABLET BY MOUTH ONCE DAILY **DISCONTINUE 130MG**  . ferrous sulfate 325 (65 FE) MG tablet Take 1 tablet (325 mg total) by mouth every other day.  . furosemide (LASIX) 20 MG tablet TAKE 1 TABLET BY MOUTH DAILY AS NEEDED FOR EDEMA (Patient taking differently: Take 20 mg by mouth daily. )  . glucose blood (ACCU-CHEK AVIVA) test strip Use as directed to check blood sugar 4 times daily.  DX E11.9  . insulin aspart (NOVOLOG FLEXPEN) 100 UNIT/ML FlexPen 3 times a day (just before each meal) 65-80-130 units  . insulin NPH Human (NOVOLIN N) 100 UNIT/ML injection Inject 0.8 mLs (80 Units total) into the skin at bedtime.  . Insulin Pen Needle 31G X 8 MM MISC 1 each by Does not apply route 3 (three) times daily. E11.9  . INSULIN SYRINGE 1CC/29G 29G X 1/2" 1 ML MISC 1 each by Does not apply route daily. Use to inject insulin daily  . levothyroxine (SYNTHROID) 100 MCG tablet TAKE 1 TABLET (100 MCG TOTAL) BY MOUTH DAILY.  . metoprolol succinate (TOPROL-XL) 50 MG 24 hr tablet TAKE 1&1/2 TABLETS BY MOUTH ONCE A DAY FOR A TOTAL OF 75 MG. (Patient taking differently: Take 75 mg by mouth daily. )  . nitroGLYCERIN (NITROSTAT) 0.4 MG SL tablet  PLACE 1 TABLET UNDER THE TONGUE EVERY 5 MINUTES AS NEEDED FOR CHEST PAIN. (Patient taking differently: Place 0.4 mg under the tongue every 5 (five) minutes as needed for chest pain. )  . pantoprazole (PROTONIX) 40 MG tablet Take 1 tablet (40 mg total) by mouth daily.  . pramipexole (MIRAPEX) 1 MG tablet TAKE 1/2 TO 1 TABLET AT BEDTIME  .  ranolazine (RANEXA) 500 MG 12 hr tablet TAKE 1 TABLET (500 MG TOTAL) BY MOUTH 2 (TWO) TIMES DAILY.  Marland Kitchen rOPINIRole (REQUIP) 1 MG tablet Take 1 tablet (1 mg total) by mouth at bedtime.  . sertraline (ZOLOFT) 100 MG tablet TAKE 1 TABLET (100 MG TOTAL) BY MOUTH 2 (TWO) TIMES DAILY.  Marland Kitchen tiZANidine (ZANAFLEX) 4 MG tablet Take 1 tablet (4 mg total) by mouth every 6 (six) hours as needed for muscle spasms.  . Vitamin D, Ergocalciferol, (DRISDOL) 1.25 MG (50000 UT) CAPS capsule Take 1 capsule (50,000 Units total) by mouth every 7 (seven) days.     Allergies:   Morphine and Shellfish-derived products   Social History   Socioeconomic History  . Marital status: Married    Spouse name: Not on file  . Number of children: 4  . Years of education: 78  . Highest education level: Not on file  Occupational History  . Occupation: retired  Tobacco Use  . Smoking status: Never Smoker  . Smokeless tobacco: Never Used  Substance and Sexual Activity  . Alcohol use: No    Alcohol/week: 0.0 standard drinks    Comment: stopped drinking in 1998  . Drug use: No  . Sexual activity: Not on file  Other Topics Concern  . Not on file  Social History Narrative   Patient is right handed.   Patient does not drink caffeine.   Social Determinants of Health   Financial Resource Strain:   . Difficulty of Paying Living Expenses: Not on file  Food Insecurity:   . Worried About Charity fundraiser in the Last Year: Not on file  . Ran Out of Food in the Last Year: Not on file  Transportation Needs:   . Lack of Transportation (Medical): Not on file  . Lack of Transportation  (Non-Medical): Not on file  Physical Activity:   . Days of Exercise per Week: Not on file  . Minutes of Exercise per Session: Not on file  Stress:   . Feeling of Stress : Not on file  Social Connections:   . Frequency of Communication with Friends and Family: Not on file  . Frequency of Social Gatherings with Friends and Family: Not on file  . Attends Religious Services: Not on file  . Active Member of Clubs or Organizations: Not on file  . Attends Archivist Meetings: Not on file  . Marital Status: Not on file     Family History:  The patient's   family history includes Cancer in his maternal grandmother; Coronary artery disease in his brother and brother; Diabetes in his brother, brother, and mother; Heart attack in an other family member; Heart failure in his father and mother; Hypothyroidism in his brother; Irregular heart beat in his daughter; Other in his brother; Pancreatic cancer in his brother; Stroke in his brother.   ROS:   Please see the history of present illness.    ROS All other systems reviewed and are negative.   PHYSICAL EXAM:   VS:  BP 130/70   Ht 5' 9"  (1.753 m)   Wt 214 lb 12.8 oz (97.4 kg)   BMI 31.72 kg/m   Physical Exam  GEN: Obese, in no acute distress  Neck: no JVD, carotid bruits, or masses Cardiac:RRR; no murmurs, rubs, or gallops  Respiratory:  clear to auscultation bilaterally, normal work of breathing GI: soft, nontender, nondistended, + BS Ext: Right groin without hematoma or hemorrhage at cath site good femoral and distal pulses  otherwise lower extremities without cyanosis, clubbing, or edema, Good distal pulses bilaterally Neuro:  Alert and Oriented x 3 Psych: euthymic mood, full affect  Wt Readings from Last 3 Encounters:  01/22/20 214 lb 12.8 oz (97.4 kg)  01/08/20 208 lb 11.2 oz (94.7 kg)  12/27/19 218 lb 12.8 oz (99.2 kg)      Studies/Labs Reviewed:   EKG:  EKG is not ordered today.    Recent Labs: 02/01/2019: NT-Pro BNP  96 12/27/2019: TSH 4.38 01/05/2020: ALT 31 01/08/2020: BUN 19; Creatinine, Ser 1.79; Hemoglobin 12.7; Platelets 197; Potassium 4.2; Sodium 136   Lipid Panel    Component Value Date/Time   CHOL 123 12/27/2019 1019   CHOL 99 (L) 09/27/2017 0823   TRIG 117.0 12/27/2019 1019   HDL 34.50 (L) 12/27/2019 1019   HDL 30 (L) 09/27/2017 0823   CHOLHDL 4 12/27/2019 1019   VLDL 23.4 12/27/2019 1019   LDLCALC 65 12/27/2019 1019   LDLCALC 35 09/27/2017 0823   LDLDIRECT 77.0 08/20/2019 0924    Additional studies/ records that were reviewed today include:   TTE: 01/08/20   IMPRESSIONS     1. Left ventricular ejection fraction, by visual estimation, is 60 to  65%. The left ventricle has normal function. Mildly increased left  ventricular posterior wall thickness. There is mildly increased left  ventricular hypertrophy of the basal septum.   2. The left ventricle has no regional wall motion abnormalities.   3. Left ventricular diastolic parameters are indeterminate.   4. Elevated left ventricular end-diastolic pressure.   5. Global right ventricle has normal systolic function.The right  ventricular size is normal. No increase in right ventricular wall  thickness.   6. Left atrial size was normal.   7. Right atrial size was normal.   8. The mitral valve is normal in structure. Mild mitral valve  regurgitation. No evidence of mitral stenosis.   9. The tricuspid valve is normal in structure. Tricuspid valve  regurgitation is not demonstrated.  10. The aortic valve is normal in structure. Aortic valve regurgitation is  not visualized. No evidence of aortic valve sclerosis or stenosis.  11. The pulmonic valve was normal in structure. Pulmonic valve  regurgitation is trivial.  12. The inferior vena cava is normal in size with <50% respiratory  variability, suggesting right atrial pressure of 8 mmHg.  _____________   ASSESSMENT:    1. Coronary artery disease involving coronary bypass graft of  native heart without angina pectoris   2. Chronic kidney disease, unspecified CKD stage   3. Permanent atrial fibrillation ([place])   4. Hyperlipidemia, unspecified hyperlipidemia type   5. Orthostatic hypotension      PLAN:  In order of problems listed above:  CAD status post prior CABG 2000, DES to the RCA  2009.  Most recent cath 01/07/2020 no significant change compared to last cath in 2019.  Suspect microvascular angina.  Imdur was supposed to be decreased to 60 mg daily but it was stopped completely. He says this helps him more than anything. No hypotension. With  amlodipine added. Having some dyspnea on exertion. Will restart imdur at 30 mg daily.  Renal insufficiency creatinine improved from 2.13-1.7 at discharge will check be met today  PAF on amiodarone and eliquis  Hyperlipidemia LDL 65 12/27/19 on lipitor and tricor  History of orthostatic hypotension had not had recent problems on midodrine stopped in the hospital-no problems yet.  Medication Adjustments/Labs and Tests Ordered: Current medicines are reviewed at length with the  patient today.  Concerns regarding medicines are outlined above.  Medication changes, Labs and Tests ordered today are listed in the Patient Instructions below. Patient Instructions  Medication Instructions:  Your physician has recommended you make the following change in your medication:   START: isosorbide mononitrate (imdur) 30 mg tablet: Take 1 tablet by mouth once a day  If you need a refill on your cardiac medications before your next appointment, please call your pharmacy.   Lab work: TODAY: BMET  If you have labs (blood work) drawn today and your tests are completely normal, you will receive your results only by: Marland Kitchen MyChart Message (if you have MyChart) OR . A paper copy in the mail If you have any lab test that is abnormal or we need to change your treatment, we will call you to review the results.  Testing/Procedures: None ordered   Follow-Up: . Follow up with [name] on 03/18/20 at 10:40 PM  Any Other Special Instructions Will Be Listed Below (If Applicable).       Sumner Boast, PA-C  01/22/2020 12:17 PM    [facility] [place], [place], [place]  [address] Phone: [phone]; Fax: [phone]

## 2020-01-22 ENCOUNTER — Encounter: Payer: Self-pay | Admitting: Physician Assistant

## 2020-01-22 ENCOUNTER — Other Ambulatory Visit: Payer: Self-pay

## 2020-01-22 ENCOUNTER — Ambulatory Visit (INDEPENDENT_AMBULATORY_CARE_PROVIDER_SITE_OTHER): Payer: Medicare PPO | Admitting: Physician Assistant

## 2020-01-22 VITALS — BP 130/70 | Ht 69.0 in | Wt 214.8 lb

## 2020-01-22 DIAGNOSIS — I4821 Permanent atrial fibrillation: Secondary | ICD-10-CM | POA: Diagnosis not present

## 2020-01-22 DIAGNOSIS — E785 Hyperlipidemia, unspecified: Secondary | ICD-10-CM | POA: Diagnosis not present

## 2020-01-22 DIAGNOSIS — I951 Orthostatic hypotension: Secondary | ICD-10-CM

## 2020-01-22 DIAGNOSIS — N189 Chronic kidney disease, unspecified: Secondary | ICD-10-CM | POA: Diagnosis not present

## 2020-01-22 DIAGNOSIS — I25810 Atherosclerosis of coronary artery bypass graft(s) without angina pectoris: Secondary | ICD-10-CM | POA: Diagnosis not present

## 2020-01-22 MED ORDER — ISOSORBIDE MONONITRATE ER 30 MG PO TB24: 30.0000 mg | ORAL_TABLET | Freq: Every day | ORAL | 3 refills | Status: DC

## 2020-01-22 MED FILL — ISOSORBIDE MN ER 30 MG TAB: 30 | 90 days supply | Qty: 90 | Fill #0

## 2020-01-22 NOTE — Patient Instructions (Signed)
Medication Instructions:  Your physician has recommended you make the following change in your medication:   START: isosorbide mononitrate (imdur) 30 mg tablet: Take 1 tablet by mouth once a day  If you need a refill on your cardiac medications before your next appointment, please call your pharmacy.   Lab work: TODAY: BMET  If you have labs (blood work) drawn today and your tests are completely normal, you will receive your results only by: Marland Kitchen MyChart Message (if you have MyChart) OR . A paper copy in the mail If you have any lab test that is abnormal or we need to change your treatment, we will call you to review the results.  Testing/Procedures: None ordered  Follow-Up: . Follow up with [name] on 03/18/20 at 10:40 PM  Any Other Special Instructions Will Be Listed Below (If Applicable).

## 2020-01-23 LAB — BASIC METABOLIC PANEL
BUN/Creatinine Ratio: 10 (ref 10–24)
BUN: 18 mg/dL (ref 8–27)
CO2: 20 mmol/L (ref 20–29)
Calcium: 9.4 mg/dL (ref 8.6–10.2)
Chloride: 105 mmol/L (ref 96–106)
Creatinine, Ser: 1.75 mg/dL — ABNORMAL HIGH (ref 0.76–1.27)
GFR calc Af Amer: 44 mL/min/{1.73_m2} — ABNORMAL LOW (ref >59)
GFR calc non Af Amer: 38 mL/min/{1.73_m2} — ABNORMAL LOW (ref >59)
Glucose: 223 mg/dL — ABNORMAL HIGH (ref 65–99)
Potassium: 4.5 mmol/L (ref 3.5–5.2)
Sodium: 140 mmol/L (ref 134–144)

## 2020-01-25 ENCOUNTER — Other Ambulatory Visit: Payer: Self-pay | Admitting: Family Medicine

## 2020-01-25 ENCOUNTER — Ambulatory Visit (INDEPENDENT_AMBULATORY_CARE_PROVIDER_SITE_OTHER): Payer: Medicare PPO | Admitting: Endocrinology

## 2020-01-25 DIAGNOSIS — E1165 Type 2 diabetes mellitus with hyperglycemia: Secondary | ICD-10-CM

## 2020-01-25 MED ORDER — NOVOLOG FLEXPEN 100 UNIT/ML ~~LOC~~ SOPN: PEN_INJECTOR | SUBCUTANEOUS | 11 refills | Status: DC

## 2020-01-25 MED FILL — RANOLAZINE ER 500 MG TABLET: 500 | 30 days supply | Qty: 60 | Fill #0

## 2020-01-25 MED FILL — ELIQUIS 5 MG TABLET: 5 | 30 days supply | Qty: 60 | Fill #2

## 2020-01-25 NOTE — Progress Notes (Signed)
Subjective:    Patient ID: [name] [name], male    DOB: May 21, 1946, 74 y.o.   MRN: [id number]  HPI telehealth visit today via phone x 7 minutes Alternatives to telehealth are presented to this patient, and the patient agrees to the telehealth visit. Pt is advised of the cost of the visit, and agrees to this, also.   Patient is at home, and I am at the office.   Persons attending the telehealth visit: the patient and I Pt returns for f/u of diabetes mellitus:  DM type: Insulin-requiring type 2.  Dx'ed: 9323 Complications: polyneuropathy, renal insuff, CAD and PAD.  Therapy: insulin since 2008 DKA: never Severe hypoglycemia: never Pancreatitis: never Pancreatic imaging: 2017 CT showed fatty replacement of the pancreatic head and body.   Other: he takes multiple daily injections; he takes human insulin at HS, due to cost.   Interval history: I reviewed continuous glucose monitor data.  Glucose varies from 50-330.  It is in general highest at lunch and at HS.  He says he never misses the insulin.  pt states he feels well in general.   Past Medical History:  Diagnosis Date  . Acquired atrophy of thyroid 06/05/2013   Overview:  July 2014: controlled on synthroid 58mcg since 2012 May 2015: decreased to Synthroid 50 mcg  Last Assessment & Plan:  Pt doing well with most recent TSH within normal range.  Will continue current dosage of Synthroid 3mcg. Pt reminded to take medication on empty stomach. Will continue to monitor with periodic laboratory assessment in 3 months.   . Anemia    hemoglobin 7.4, iron deficiency, January, 2011, 2 unit transfusion, endoscopy normal, capsule endoscopy February, 2011 no small bowel abnormalities.   Most likely source gastric erosions, followed by GI  . Anxiety   . Bradycardia   . CAD (coronary artery disease)    A. CABG in 2000,status post cardiac cath in 2006, 2009 ....continued chest pain and SOB despite oral medication adjestments including Ranexa. B.  Cath November 2009/ mRCA - 2.75 x 23 Abbott Xience V drug-eluting stent ...11/26/2008 to distal  RCA leading to acute marginal.  C. Cath 07/2012 for CP - stable anatomy, med rx. d. cath 2015 and 05/30/2015 stable anatomy, consider Myoview if has CP again  . Carotid artery disease ([place]) 08/01/2015   Doppler, May 29, 2015, 1-39% bilateral ICA   . Cerebral ischemia    MRI November, 2010, chronic microvascular ischemia  . CKD (chronic kidney disease), stage III   . Concussion   . Depression    Bipolar  . Diabetes mellitus ([place])   . Edema   . Essential hypertension 06/05/2013   Overview:  July 2014: Controlled with Bystolic Sep 5573: Changed to atenolol.  Last Assessment & Plan:  Will change to atenolol for cost and follow Improved.  Medication compliance strongly encouraged BP: 116/64 mmHg    Overview:  July 2014: Controlled with Bystolic Sep 2202: Changed to atenolol.  Last Assessment & Plan:  Will change to atenolol for cost and follow  . Falling episodes    these have occurred in the past and again recurring 2011  . Family history of adverse reaction to anesthesia    "mother died during bypass surgery but not sure if it has to do with anesthesia"  . Gastric ulcer   . GERD (gastroesophageal reflux disease)   . H/O medication noncompliance    Due to loss of insurance  . H/O multiple concussions   . Hard  of hearing   . History of blood transfusion 12/20/2013  . Hx of CABG    2000,  / one median sternotomy suture broken her chest x-ray November, 2010, no clinical significance  . Hyperlipidemia   . Hypertension    pt. denies  . Iron deficiency anemia   . Long term (current) use of anticoagulants [Z79.01] 07/20/2016  . Low back pain 06/12/2009   Qualifier: Diagnosis of  By: [name]   . Nephrolithiasis   . Orthostasis   . OSA (obstructive sleep apnea)   . PAF (paroxysmal atrial fibrillation) ([place])    a. dx 2017, started on amiodarone/warfarin but patient intermittently noncompliant.   Marland Kitchen PSVT (paroxysmal supraventricular tachycardia) ([place])   . RBBB 07/09/2009   Qualifier: Diagnosis of  By: [name], MD, [name] [name]   . RLS (restless legs syndrome) 09/19/2009   Qualifier: Diagnosis of  By: [name]   Overview:  July 2014: Controlled with Mirapex  Last Assessment & Plan:  Patient is doing well. Will continue current management and follow clinically.  . Seizure disorder ([place]) 01/09/2017  . Spondylosis    C5-6, C6-7 MRI 2010  . Syncope 03/2016  . Thyroid disease   . Tubulovillous adenoma of colon 2007  . Vitamin D deficiency 05/17/2017  . Wears glasses     Past Surgical History:  Procedure Laterality Date  . ANTERIOR CERVICAL DECOMP/DISCECTOMY FUSION N/A 05/31/2016   Procedure: ANTERIOR CERVICAL DECOMPRESSION/DISCECTOMY FUSION CERVICAL FIVE-SIX,CERVICAL SIX-SEVEN;  Surgeon: [name], MD;  Location: [facility];  Service: Neurosurgery;  Laterality: N/A;  . CARDIAC CATHETERIZATION N/A 05/30/2015   Procedure: Left Heart Cath and Coronary Angiography;  Surgeon: [name], MD;  Location: [place];  Service: Cardiovascular;  Laterality: N/A;  . COLONOSCOPY    . CORONARY ARTERY BYPASS GRAFT     2000  . ESOPHAGOGASTRODUODENOSCOPY    . LEFT HEART CATH AND CORS/GRAFTS ANGIOGRAPHY N/A 12/15/2017   Procedure: LEFT HEART CATH AND CORS/GRAFTS ANGIOGRAPHY;  Surgeon: [name], MD;  Location: [place] [facility];  Service: Cardiovascular;  Laterality: N/A;  . LEFT HEART CATHETERIZATION WITH CORONARY/GRAFT ANGIOGRAM N/A 08/01/2012   Procedure: LEFT HEART CATHETERIZATION WITH Beatrix Fetters;  Surgeon: [name], MD;  Location[facility];  Service: Cardiovascular;  Laterality: N/A;  . LEFT HEART CATHETERIZATION WITH CORONARY/GRAFT ANGIOGRAM N/A 01/03/2015   Procedure: LEFT HEART CATHETERIZATION WITH Beatrix Fetters;  Surgeon: [name], MD;  Location: [facility] CATH LAB;  Service: Cardiovascular;  Laterality: N/A;  . NASAL SEPTUM  SURGERY     UP3  . PERCUTANEOUS CORONARY STENT INTERVENTION (PCI-S)  10/2008   mRCA PCI  2.75 x 23 Abbott Xience V drug-eluting stent   . RIGHT/LEFT HEART CATH AND CORONARY/GRAFT ANGIOGRAPHY N/A 01/07/2020   Procedure: RIGHT/LEFT HEART CATH AND CORONARY/GRAFT ANGIOGRAPHY;  Surgeon: [name], MD;  Location: [place] [facility];  Service: Cardiovascular;  Laterality: N/A;  . ULTRASOUND GUIDANCE FOR VASCULAR ACCESS  12/15/2017   Procedure: Ultrasound Guidance For Vascular Access;  Surgeon: [name], MD;  Location: [place] [facility];  Service: Cardiovascular;;    Social History   Socioeconomic History  . Marital status: Married    Spouse name: Not on file  . Number of children: 4  . Years of education: 35  . Highest education level: Not on file  Occupational History  . Occupation: retired  Tobacco Use  . Smoking status: Never Smoker  . Smokeless tobacco:  Never Used  Substance and Sexual Activity  . Alcohol use: No    Alcohol/week: 0.0 standard drinks    Comment: stopped drinking in 1998  . Drug use: No  . Sexual activity: Not on file  Other Topics Concern  . Not on file  Social History Narrative   Patient is right handed.   Patient does not drink caffeine.   Social Determinants of Health   Financial Resource Strain:   . Difficulty of Paying Living Expenses: Not on file  Food Insecurity:   . Worried About Charity fundraiser in the Last Year: Not on file  . Ran Out of Food in the Last Year: Not on file  Transportation Needs:   . Lack of Transportation (Medical): Not on file  . Lack of Transportation (Non-Medical): Not on file  Physical Activity:   . Days of Exercise per Week: Not on file  . Minutes of Exercise per Session: Not on file  Stress:   . Feeling of Stress : Not on file  Social Connections:   . Frequency of Communication with Friends and Family: Not on file  . Frequency of Social Gatherings with Friends and Family: Not on file  . Attends  Religious Services: Not on file  . Active Member of Clubs or Organizations: Not on file  . Attends Archivist Meetings: Not on file  . Marital Status: Not on file  Intimate Partner Violence:   . Fear of Current or Ex-Partner: Not on file  . Emotionally Abused: Not on file  . Physically Abused: Not on file  . Sexually Abused: Not on file    Current Outpatient Medications on File Prior to Visit  Medication Sig Dispense Refill  . albuterol (VENTOLIN HFA) 108 (90 Base) MCG/ACT inhaler Inhale 2 puffs into the lungs every 6 (six) hours as needed for wheezing or shortness of breath. 18 g 2  . amLODipine (NORVASC) 5 MG tablet Take 1 tablet (5 mg total) by mouth daily. 90 tablet 1  . atorvastatin (LIPITOR) 80 MG tablet Take 1 tablet (80 mg total) by mouth daily. 90 tablet 3  . Continuous Blood Gluc Sensor (FREESTYLE LIBRE 14 DAY SENSOR) MISC 1 Device by Does not apply route every 14 (fourteen) days. 6 each 3  . divalproex (DEPAKOTE ER) 250 MG 24 hr tablet Take 3 tablets (750 mg total) by mouth at bedtime. 180 tablet 0  . ELIQUIS 5 MG TABS tablet TAKE 1 TABLET (5 MG TOTAL) BY MOUTH 2 (TWO) TIMES DAILY. (Patient taking differently: Take 5 mg by mouth 2 (two) times daily. ) 60 tablet 5  . fenofibrate (TRICOR) 145 MG tablet TAKE 1 TABLET BY MOUTH ONCE DAILY **DISCONTINUE 130MG ** 30 tablet 3  . ferrous sulfate 325 (65 FE) MG tablet Take 1 tablet (325 mg total) by mouth every other day.  3  . furosemide (LASIX) 20 MG tablet TAKE 1 TABLET BY MOUTH DAILY AS NEEDED FOR EDEMA (Patient taking differently: Take 20 mg by mouth daily. ) 30 tablet 3  . glucose blood (ACCU-CHEK AVIVA) test strip Use as directed to check blood sugar 4 times daily.  DX E11.9 200 each 6  . insulin NPH Human (NOVOLIN N) 100 UNIT/ML injection Inject 0.8 mLs (80 Units total) into the skin at bedtime. 30 mL 11  . Insulin Pen Needle 31G X 8 MM MISC 1 each by Does not apply route 3 (three) times daily. E11.9 60 each 0  . INSULIN  SYRINGE 1CC/29G 29G X  1/2" 1 ML MISC 1 each by Does not apply route daily. Use to inject insulin daily 30 each 0  . isosorbide mononitrate (IMDUR) 30 MG 24 hr tablet Take 1 tablet (30 mg total) by mouth daily. 90 tablet 3  . levothyroxine (SYNTHROID) 100 MCG tablet TAKE 1 TABLET (100 MCG TOTAL) BY MOUTH DAILY. 90 tablet 1  . metoprolol succinate (TOPROL-XL) 50 MG 24 hr tablet TAKE 1&1/2 TABLETS BY MOUTH ONCE A DAY FOR A TOTAL OF 75 MG. (Patient taking differently: Take 75 mg by mouth daily. ) 135 tablet 3  . nitroGLYCERIN (NITROSTAT) 0.4 MG SL tablet PLACE 1 TABLET UNDER THE TONGUE EVERY 5 MINUTES AS NEEDED FOR CHEST PAIN. (Patient taking differently: Place 0.4 mg under the tongue every 5 (five) minutes as needed for chest pain. ) 25 tablet 1  . pantoprazole (PROTONIX) 40 MG tablet Take 1 tablet (40 mg total) by mouth daily. 90 tablet 0  . pramipexole (MIRAPEX) 1 MG tablet TAKE 1/2 TO 1 TABLET AT BEDTIME 90 tablet 0  . rOPINIRole (REQUIP) 1 MG tablet Take 1 tablet (1 mg total) by mouth at bedtime. 30 tablet 1  . sertraline (ZOLOFT) 100 MG tablet TAKE 1 TABLET (100 MG TOTAL) BY MOUTH 2 (TWO) TIMES DAILY. 180 tablet 1  . tiZANidine (ZANAFLEX) 4 MG tablet Take 1 tablet (4 mg total) by mouth every 6 (six) hours as needed for muscle spasms. 40 tablet 2  . Vitamin D, Ergocalciferol, (DRISDOL) 1.25 MG (50000 UT) CAPS capsule Take 1 capsule (50,000 Units total) by mouth every 7 (seven) days. 20 capsule 0   No current facility-administered medications on file prior to visit.    Allergies  Allergen Reactions  . Morphine Other (See Comments)    hallucinations  . Shellfish-Derived Products Itching    Family History  Problem Relation Age of Onset  . Pancreatic cancer Brother   . Diabetes Brother   . Coronary artery disease Brother   . Stroke Brother   . Diabetes Brother   . Diabetes Mother   . Heart failure Mother   . Heart failure Father   . Hypothyroidism Brother   . Coronary artery disease  Brother   . Other Brother        colon surgery  . Heart attack Other        Nephew  . Irregular heart beat Daughter   . Cancer Maternal Grandmother        unknown   . Colon cancer Neg Hx   . Stomach cancer Neg Hx   . Liver cancer Neg Hx   . Rectal cancer Neg Hx   . Esophageal cancer Neg Hx     There were no vitals taken for this visit.  Review of Systems Denies LOC    Objective:   Physical Exam   Lab Results  Component Value Date   CREATININE 1.75 (H) 01/22/2020   BUN 18 01/22/2020   NA 140 01/22/2020   K 4.5 01/22/2020   CL 105 01/22/2020   CO2 20 01/22/2020    Lab Results  Component Value Date   HGBA1C 7.4 (A) 11/07/2019      Assessment & Plan:  Insulin-requiring type 2 DM, with PAD: apparently overcontrolled Renal failure: in this setting, he needs mostly mealtime insulin.  Hypoglycemia: this limits aggressiveness of glycemic control.  Patient Instructions  Please reduce the lunch insulin to 70 units, and:  Please continue the same other insulin shots.  Please come back for a follow-up  appointment in 1 month.

## 2020-01-25 NOTE — Patient Instructions (Signed)
Please reduce the lunch insulin to 70 units, and:  Please continue the same other insulin shots.  Please come back for a follow-up appointment in 1 month.

## 2020-01-31 ENCOUNTER — Other Ambulatory Visit: Payer: Self-pay | Admitting: Family Medicine

## 2020-01-31 MED FILL — tiZANidine HCL 4 MG TABS: 4 | 10 days supply | Qty: 40 | Fill #0

## 2020-02-06 MED FILL — NovoLIN N 100 UNIT/ML SUSP: 100 | 38 days supply | Qty: 30 | Fill #3

## 2020-02-06 MED FILL — rOPINIRole HCL 1 MG TABS: 1 | 30 days supply | Qty: 30 | Fill #1

## 2020-02-06 MED FILL — FUROSEMIDE 20 MG TAB: 20 | 30 days supply | Qty: 30 | Fill #1

## 2020-02-06 MED FILL — SERTRALINE HCL 100 MG TABS: 100 | 90 days supply | Qty: 180 | Fill #1

## 2020-02-06 MED FILL — FENOFIBRATE 145 MG TABS: 145 | 30 days supply | Qty: 30 | Fill #1

## 2020-02-08 ENCOUNTER — Other Ambulatory Visit: Payer: Self-pay | Admitting: Interventional Cardiology

## 2020-02-14 MED FILL — NOVOLOG FLEXPEN SYRINGE: 100 | 7 days supply | Qty: 15 | Fill #6

## 2020-02-27 ENCOUNTER — Other Ambulatory Visit: Payer: Self-pay | Admitting: Family Medicine

## 2020-02-27 MED FILL — ELIQUIS 5 MG TABLET: 5 | 30 days supply | Qty: 60 | Fill #3

## 2020-02-27 MED FILL — NOVOLOG FLEXPEN SYRINGE: 100 | 7 days supply | Qty: 15 | Fill #6

## 2020-02-27 MED FILL — RANOLAZINE ER 500 MG TABLET: 500 | 30 days supply | Qty: 60 | Fill #0

## 2020-02-29 ENCOUNTER — Telehealth: Payer: Self-pay | Admitting: Interventional Cardiology

## 2020-02-29 MED FILL — FUROSEMIDE 20 MG TAB: 20 | 30 days supply | Qty: 30 | Fill #2

## 2020-02-29 NOTE — Telephone Encounter (Signed)
Left message to call back  

## 2020-02-29 NOTE — Telephone Encounter (Signed)
New Message   Pt c/o medication issue:  1. Name of Medication: isosorbide mononitrate (IMDUR) 30 MG 24 hr tablet   2. How are you currently taking this medication (dosage and times per day)?   3. Are you having a reaction (difficulty breathing--STAT)?   4. What is your medication issue? Patient states that when he was in the hospital he was told to stop the isosrobide. He states that he was told to take  Amlodipine but he thinks this may be causing to have occasional chest pain and some shortness of breath. Please call to discuss.

## 2020-03-03 NOTE — Telephone Encounter (Signed)
Called and spoke to patient. He states that he looked through all of his papers and he started taking the imdur 30 mg QD as instructed by Ermalinda Barrios, PA and he states that he feels better. He denies chest pain or SOB at this time and will let us know if his Sx change or worsen before his f/u appt on 4/13.

## 2020-03-05 ENCOUNTER — Telehealth: Payer: Self-pay | Admitting: Neurology

## 2020-03-05 NOTE — Telephone Encounter (Signed)
Patient called to cancel upcoming apt with Dr.Willis due to a fall at home. Offered mychart apt but pt declined. FYI

## 2020-03-05 NOTE — Telephone Encounter (Signed)
PT cancel appt today for 03/06/2020.

## 2020-03-06 ENCOUNTER — Ambulatory Visit: Payer: Medicare PPO | Admitting: Neurology

## 2020-03-07 ENCOUNTER — Other Ambulatory Visit: Payer: Self-pay | Admitting: Family Medicine

## 2020-03-07 MED FILL — NOVOLOG FLEXPEN SYRINGE: 100 | 7 days supply | Qty: 15 | Fill #7

## 2020-03-07 MED FILL — FENOFIBRATE 145 MG TABS: 145 | 30 days supply | Qty: 30 | Fill #2

## 2020-03-11 ENCOUNTER — Other Ambulatory Visit: Payer: Self-pay | Admitting: Family Medicine

## 2020-03-11 MED FILL — rOPINIRole HCL 1 MG TABS: 1 | 30 days supply | Qty: 30 | Fill #0

## 2020-03-11 MED FILL — ATORVASTATIN 80 MG TABLET: 80 | 90 days supply | Qty: 90 | Fill #0

## 2020-03-12 MED FILL — NovoLIN N 100 UNIT/ML SUSP: 100 | 38 days supply | Qty: 30 | Fill #4

## 2020-03-17 ENCOUNTER — Telehealth: Payer: Self-pay | Admitting: Interventional Cardiology

## 2020-03-17 NOTE — Telephone Encounter (Signed)
New message   Patient's wife states that the patient's daughter will be coming with him to appt tomorrow due to patient having difficulty walking.

## 2020-03-17 NOTE — Telephone Encounter (Signed)
Noted  

## 2020-03-17 NOTE — Progress Notes (Signed)
Cardiology Office Note   Date:  03/18/2020   ID:  [name] [name], DOB 1946/02/27, MRN [id number]  PCP:  [name] Lukes, MD    No chief complaint on file.  CAD  Wt Readings from Last 3 Encounters:  03/18/20 218 lb 12.8 oz (99.2 kg)  01/22/20 214 lb 12.8 oz (97.4 kg)  01/08/20 208 lb 11.2 oz (94.7 kg)       History of Present Illness: [name] [name] is a 74 y.o. male  with history ofCAD (CABG 2000, stent to RCA 2009), paroxysmal atrial fibrillation,PSVT,CKD III, noncompliance, orthostatic hypotension, iron deficiency anemia, prior gastric erosions/ulcer, ?chronic diastolic CHF (no diastolic dysfunction on echo and normal BNP), bipolar depression, DM, HTN, HLD, kidney stones, OSA, RBBB, deconditoning, thyroid disease, spondylosis and back pain.  He has prior history of chest discomfort, palpitations and dyspnearequiring med titration and repeat caths/monitorsover the years. His last PCI was in 2009. A CPX in 2011 was unrevealing, felt to reflect deconditioning and perhaps mild chronotropic incompetence. He had caths in 2015, 2016, and 12/2017 showing stable anatomy with patent bypass grafts and medical therapy recommended withpossible component of microvascular angina. In 03/2016 he was admitted near syncope and transient confusion. MRI was negative for stroke. EEG was normal. He was seen by neurology. There was no significant carotid artery stenosis on CTA. Echocardiogram demonstrated normal LV function with mild diastolic dysfunction. No arrhythmias noted on telemetry. TIA could not be ruled out. Aspirin was changed to Plavix. He was bradycardic and his beta blocker dose was reduced. He was readmitted 03/2016 with lightheadedness, confusion, slurred speech and right facial droop,left hand tremors, and intermittent hypotension. There was also reported history of occasional episodes of dizziness, slurred speech, confusion followed by lethargy. Patient would also fall asleep easily  without memory of the event.He has since been diagnosed with narcolepsy. His metoprolol was discontinued at one point but eventually restarted. He also required midodrine previously for orthostasis although does not appear to currently be on this.A monitor did demonstrate atrial fib/flutter so he was changed from Plavix to warfarin. Metoprolol was subsequently increased. In 09/2016 he was started on amiodarone. There does appear to be a history of compliance issues - notably including taking midodrine incorrectly, not reducing amiodarone as instructed (was on 400mg  daily for a while), or - per last OV 06/2018 - not filling amiodarone since 2017. At last OV he was reporting increased palpitations and decreased exercise tolerance with DOE. He was in NSR. BNP was normal. 2D echo 07/2018 showed EF 98-33%, normal diastolic function, mild LAE. Event monitor showed NSR with PACs/PVCs and short runs of SVT, lowest HR 45bpm. Carotid duplex 07/2018 showed minimal plaque without significant stenosis. He was supposed to follow-up 1 month after that visit but never came. In 11/2018 he was admitted for seizures suspected due to noncompliance for financial reasons. He also reported chest pain in setting of noncompliance.  Repeat cath in 2021.  Stable anatomy and normal right heart pressures. His SHOB was better after leaving the hospital.    THe [facility] has come back, specifically with bending down and standing.   CRI was stable at discharge.  Left facial swelling starting this morning.  Past Medical History:  Diagnosis Date  . Acquired atrophy of thyroid 06/05/2013   Overview:  July 2014: controlled on synthroid 55mcg since 2012 May 2015: decreased to Synthroid 50 mcg  Last Assessment & Plan:  Pt doing well with most recent TSH within normal range.  Will  continue current dosage of Synthroid 70mcg. Pt reminded to take medication on empty stomach. Will continue to monitor with periodic laboratory assessment in 3 months.   .  Anemia    hemoglobin 7.4, iron deficiency, January, 2011, 2 unit transfusion, endoscopy normal, capsule endoscopy February, 2011 no small bowel abnormalities.   Most likely source gastric erosions, followed by GI  . Anxiety   . Bradycardia   . CAD (coronary artery disease)    A. CABG in 2000,status post cardiac cath in 2006, 2009 ....continued chest pain and SOB despite oral medication adjestments including Ranexa. B. Cath November 2009/ mRCA - 2.75 x 23 Abbott Xience V drug-eluting stent ...11/26/2008 to distal  RCA leading to acute marginal.  C. Cath 07/2012 for CP - stable anatomy, med rx. d. cath 2015 and 05/30/2015 stable anatomy, consider Myoview if has CP again  . Carotid artery disease ([place]) 08/01/2015   Doppler, May 29, 2015, 1-39% bilateral ICA   . Cerebral ischemia    MRI November, 2010, chronic microvascular ischemia  . CKD (chronic kidney disease), stage III   . Concussion   . Depression    Bipolar  . Diabetes mellitus ([place])   . Edema   . Essential hypertension 06/05/2013   Overview:  July 2014: Controlled with Bystolic Sep 3614: Changed to atenolol.  Last Assessment & Plan:  Will change to atenolol for cost and follow Improved.  Medication compliance strongly encouraged BP: 116/64 mmHg    Overview:  July 2014: Controlled with Bystolic Sep 4315: Changed to atenolol.  Last Assessment & Plan:  Will change to atenolol for cost and follow  . Falling episodes    these have occurred in the past and again recurring 2011  . Family history of adverse reaction to anesthesia    "mother died during bypass surgery but not sure if it has to do with anesthesia"  . Gastric ulcer   . GERD (gastroesophageal reflux disease)   . H/O medication noncompliance    Due to loss of insurance  . H/O multiple concussions   . Hard of hearing   . History of blood transfusion 12/20/2013  . Hx of CABG    2000,  / one median sternotomy suture broken her chest x-ray November, 2010, no clinical significance  .  Hyperlipidemia   . Hypertension    pt. denies  . Iron deficiency anemia   . Long term (current) use of anticoagulants [Z79.01] 07/20/2016  . Low back pain 06/12/2009   Qualifier: Diagnosis of  By: [name]   . Nephrolithiasis   . Orthostasis   . OSA (obstructive sleep apnea)   . PAF (paroxysmal atrial fibrillation) ([place])    a. dx 2017, started on amiodarone/warfarin but patient intermittently noncompliant.  Marland Kitchen PSVT (paroxysmal supraventricular tachycardia) ([place])   . RBBB 07/09/2009   Qualifier: Diagnosis of  By: [name], MD, [name] [name]   . RLS (restless legs syndrome) 09/19/2009   Qualifier: Diagnosis of  By: [name]   Overview:  July 2014: Controlled with Mirapex  Last Assessment & Plan:  Patient is doing well. Will continue current management and follow clinically.  . Seizure disorder ([place]) 01/09/2017  . Spondylosis    C5-6, C6-7 MRI 2010  . Syncope 03/2016  . Thyroid disease   . Tubulovillous adenoma of colon 2007  . Vitamin D deficiency 05/17/2017  . Wears glasses     Past Surgical History:  Procedure Laterality Date  . ANTERIOR CERVICAL DECOMP/DISCECTOMY FUSION  N/A 05/31/2016   Procedure: ANTERIOR CERVICAL DECOMPRESSION/DISCECTOMY FUSION CERVICAL FIVE-SIX,CERVICAL SIX-SEVEN;  Surgeon: [name], MD;  Location: [place] [facility];  Service: Neurosurgery;  Laterality: N/A;  . CARDIAC CATHETERIZATION N/A 05/30/2015   Procedure: Left Heart Cath and Coronary Angiography;  Surgeon: [name], MD;  Location: [place] CV LAB;  Service: Cardiovascular;  Laterality: N/A;  . COLONOSCOPY    . CORONARY ARTERY BYPASS GRAFT     2000  . ESOPHAGOGASTRODUODENOSCOPY    . LEFT HEART CATH AND CORS/GRAFTS ANGIOGRAPHY N/A 12/15/2017   Procedure: LEFT HEART CATH AND CORS/GRAFTS ANGIOGRAPHY;  Surgeon: [name], MD;  Location: [place] CV LAB;  Service: Cardiovascular;  Laterality: N/A;  . LEFT HEART CATHETERIZATION WITH CORONARY/GRAFT ANGIOGRAM N/A 08/01/2012    Procedure: LEFT HEART CATHETERIZATION WITH Beatrix Fetters;  Surgeon: [name], MD;  Location: [facility] CATH LAB;  Service: Cardiovascular;  Laterality: N/A;  . LEFT HEART CATHETERIZATION WITH CORONARY/GRAFT ANGIOGRAM N/A 01/03/2015   Procedure: LEFT HEART CATHETERIZATION WITH Beatrix Fetters;  Surgeon: [name], MD;  Location: [facility] CATH LAB;  Service: Cardiovascular;  Laterality: N/A;  . NASAL SEPTUM SURGERY     UP3  . PERCUTANEOUS CORONARY STENT INTERVENTION (PCI-S)  10/2008   mRCA PCI  2.75 x 23 Abbott Xience V drug-eluting stent   . RIGHT/LEFT HEART CATH AND CORONARY/GRAFT ANGIOGRAPHY N/A 01/07/2020   Procedure: RIGHT/LEFT HEART CATH AND CORONARY/GRAFT ANGIOGRAPHY;  Surgeon: [name], MD;  Location: [place] CV LAB;  Service: Cardiovascular;  Laterality: N/A;  . ULTRASOUND GUIDANCE FOR VASCULAR ACCESS  12/15/2017   Procedure: Ultrasound Guidance For Vascular Access;  Surgeon: [name], MD;  Location: [place] CV LAB;  Service: Cardiovascular;;     Current Outpatient Medications  Medication Sig Dispense Refill  . albuterol (VENTOLIN HFA) 108 (90 Base) MCG/ACT inhaler Inhale 2 puffs into the lungs every 6 (six) hours as needed for wheezing or shortness of breath. 18 g 2  . amLODipine (NORVASC) 5 MG tablet Take 1 tablet (5 mg total) by mouth daily. 90 tablet 1  . atorvastatin (LIPITOR) 80 MG tablet TAKE 1 TABLET (80 MG TOTAL) BY MOUTH DAILY. 90 tablet 3  . Continuous Blood Gluc Sensor (FREESTYLE LIBRE 14 DAY SENSOR) MISC 1 Device by Does not apply route every 14 (fourteen) days. 6 each 3  . divalproex (DEPAKOTE ER) 250 MG 24 hr tablet Take 3 tablets (750 mg total) by mouth at bedtime. 180 tablet 0  . ELIQUIS 5 MG TABS tablet TAKE 1 TABLET (5 MG TOTAL) BY MOUTH 2 (TWO) TIMES DAILY. (Patient taking differently: Take 5 mg by mouth 2 (two) times daily. ) 60 tablet 5  . fenofibrate (TRICOR) 145 MG tablet TAKE 1 TABLET BY MOUTH ONCE DAILY **DISCONTINUE  130MG ** 30 tablet 3  . ferrous sulfate 325 (65 FE) MG tablet Take 1 tablet (325 mg total) by mouth every other day.  3  . furosemide (LASIX) 20 MG tablet TAKE 1 TABLET BY MOUTH DAILY AS NEEDED FOR EDEMA (Patient taking differently: Take 20 mg by mouth daily. ) 30 tablet 3  . glucose blood (ACCU-CHEK AVIVA) test strip Use as directed to check blood sugar 4 times daily.  DX E11.9 200 each 6  . insulin aspart (NOVOLOG FLEXPEN) 100 UNIT/ML FlexPen 3 times a day (just before each meal) 65-70-130 units 30 pen 11  . insulin NPH Human (NOVOLIN N) 100 UNIT/ML injection Inject 0.8 mLs (80 Units total) into the skin at bedtime. 30 mL  11  . Insulin Pen Needle 31G X 8 MM MISC 1 each by Does not apply route 3 (three) times daily. E11.9 60 each 0  . INSULIN SYRINGE 1CC/29G 29G X 1/2" 1 ML MISC 1 each by Does not apply route daily. Use to inject insulin daily 30 each 0  . isosorbide mononitrate (IMDUR) 30 MG 24 hr tablet Take 1 tablet (30 mg total) by mouth daily. 90 tablet 3  . levothyroxine (SYNTHROID) 100 MCG tablet TAKE 1 TABLET (100 MCG TOTAL) BY MOUTH DAILY. 90 tablet 1  . metoprolol succinate (TOPROL-XL) 50 MG 24 hr tablet TAKE 1 AND 1/2 TABLETS EVERY DAY FOR A TOTAL OF 75 MG 135 tablet 3  . nitroGLYCERIN (NITROSTAT) 0.4 MG SL tablet PLACE 1 TABLET UNDER THE TONGUE EVERY 5 MINUTES AS NEEDED FOR CHEST PAIN. (Patient taking differently: Place 0.4 mg under the tongue every 5 (five) minutes as needed for chest pain. ) 25 tablet 1  . pantoprazole (PROTONIX) 40 MG tablet Take 1 tablet (40 mg total) by mouth daily. 90 tablet 0  . pramipexole (MIRAPEX) 1 MG tablet TAKE 1/2 TO 1 TABLET AT BEDTIME 90 tablet 0  . ranolazine (RANEXA) 500 MG 12 hr tablet TAKE 1 TABLET BY MOUTH TWICE DAILY 60 tablet 5  . rOPINIRole (REQUIP) 1 MG tablet TAKE 1 TABLET (1 MG TOTAL) BY MOUTH AT BEDTIME. 30 tablet 1  . sertraline (ZOLOFT) 100 MG tablet TAKE 1 TABLET (100 MG TOTAL) BY MOUTH 2 (TWO) TIMES DAILY. 180 tablet 1  . tiZANidine  (ZANAFLEX) 4 MG tablet TAKE 1 TABLET (4 MG TOTAL) BY MOUTH EVERY 6 (SIX) HOURS AS NEEDED FOR MUSCLE SPASMS. 40 tablet 2  . Vitamin D, Ergocalciferol, (DRISDOL) 1.25 MG (50000 UT) CAPS capsule Take 1 capsule (50,000 Units total) by mouth every 7 (seven) days. 20 capsule 0   No current facility-administered medications for this visit.    Allergies:   Morphine and Shellfish-derived products    Social History:  The patient  reports that he has never smoked. He has never used smokeless tobacco. He reports that he does not drink alcohol or use drugs.   Family History:  The patient's family history includes Cancer in his maternal grandmother; Coronary artery disease in his brother and brother; Diabetes in his brother, brother, and mother; Heart attack in an other family member; Heart failure in his father and mother; Hypothyroidism in his brother; Irregular heart beat in his daughter; Other in his brother; Pancreatic cancer in his brother; Stroke in his brother.    ROS:  Please see the history of present illness.   Otherwise, review of systems are positive for left face swelling.   All other systems are reviewed and negative.    PHYSICAL EXAM: VS:  BP 118/72   Pulse 75   Ht 5\' 9"  (1.753 m)   Wt 218 lb 12.8 oz (99.2 kg)   SpO2 97%   BMI 32.31 kg/m  , BMI Body mass index is 32.31 kg/m. GEN: Well nourished, well developed, in no acute distress  HEENT: left side of face is swollen Neck: no JVD, carotid bruits, or masses Cardiac: RRR; no murmurs, rubs, or gallops,no edema  Respiratory:  clear to auscultation bilaterally, normal work of breathing GI: soft, nontender, nondistended, + BS MS: no deformity or atrophy  Skin: warm and dry, no rash Neuro:  Strength and sensation are intact Psych: euthymic mood, full affect   EKG:   The ekg ordered today demonstrates  Recent Labs: 12/27/2019: TSH 4.38 01/05/2020: ALT 31 01/08/2020: Hemoglobin 12.7; Platelets 197 01/22/2020: BUN 18; Creatinine,  Ser 1.75; Potassium 4.5; Sodium 140   Lipid Panel    Component Value Date/Time   CHOL 123 12/27/2019 1019   CHOL 99 (L) 09/27/2017 0823   TRIG 117.0 12/27/2019 1019   HDL 34.50 (L) 12/27/2019 1019   HDL 30 (L) 09/27/2017 0823   CHOLHDL 4 12/27/2019 1019   VLDL 23.4 12/27/2019 1019   LDLCALC 65 12/27/2019 1019   LDLCALC 35 09/27/2017 0823   LDLDIRECT 77.0 08/20/2019 0924     Other studies Reviewed: Additional studies/ records that were reviewed today with results demonstrating: hospital records reviewed; PMD labs reviewed.   ASSESSMENT AND PLAN:  1. CAD: No angina. Continue aggressive secondary prevention. Reassuring cath results. 2. AFib: Maintaining NSR.  3. Hyperlipidemia: The current medical regimen is effective;  continue present plan and medications. 4. Obesity/DOE: increase exercise to improve stamina 5. DM: Improve diet and increase exercise.  A1C 7.4.  We discussed "talk test." Insulin use decreased due to cost, so he used less of it at the end of 2020.  6. Anticoagulated: No bleeding issues. COntinue ELiquis. 7. He can try eating sugar free hard candies in the event his facial swelling is from an occluded salivary gland   Current medicines are reviewed at length with the patient today.  The patient concerns regarding his medicines were addressed.  The following changes have been made:  Increase Imdur to 60 as he was taking before since he felt better with this dose  Labs/ tests ordered today include:  No orders of the defined types were placed in this encounter.   Recommend 150 minutes/week of aerobic exercise Low fat, low carb, high fiber diet recommended  Disposition:   FU in 1 year   Signed, [name] Grooms, MD  03/18/2020 11:01 AM    [facility] [address], [place], [place]  [address] Phone: [phone]; Fax: [phone]

## 2020-03-18 ENCOUNTER — Encounter: Payer: Self-pay | Admitting: Interventional Cardiology

## 2020-03-18 ENCOUNTER — Other Ambulatory Visit: Payer: Self-pay

## 2020-03-18 ENCOUNTER — Ambulatory Visit (INDEPENDENT_AMBULATORY_CARE_PROVIDER_SITE_OTHER): Payer: Medicare PPO | Admitting: Interventional Cardiology

## 2020-03-18 ENCOUNTER — Encounter: Payer: Self-pay | Admitting: Family Medicine

## 2020-03-18 VITALS — BP 118/72 | HR 75 | Ht 69.0 in | Wt 218.8 lb

## 2020-03-18 DIAGNOSIS — I4821 Permanent atrial fibrillation: Secondary | ICD-10-CM | POA: Diagnosis not present

## 2020-03-18 DIAGNOSIS — I25810 Atherosclerosis of coronary artery bypass graft(s) without angina pectoris: Secondary | ICD-10-CM | POA: Diagnosis not present

## 2020-03-18 DIAGNOSIS — E782 Mixed hyperlipidemia: Secondary | ICD-10-CM | POA: Diagnosis not present

## 2020-03-18 DIAGNOSIS — Z7901 Long term (current) use of anticoagulants: Secondary | ICD-10-CM

## 2020-03-18 DIAGNOSIS — E785 Hyperlipidemia, unspecified: Secondary | ICD-10-CM | POA: Diagnosis not present

## 2020-03-18 DIAGNOSIS — N189 Chronic kidney disease, unspecified: Secondary | ICD-10-CM | POA: Diagnosis not present

## 2020-03-18 MED ORDER — ISOSORBIDE MONONITRATE ER 60 MG PO TB24: 60.0000 mg | ORAL_TABLET | Freq: Every day | ORAL | 3 refills | Status: DC

## 2020-03-18 MED FILL — ISOSORBIDE MN ER 60 MG TAB: 60 | 90 days supply | Qty: 90 | Fill #0

## 2020-03-18 NOTE — Patient Instructions (Addendum)
Medication Instructions:  Your physician has recommended you make the following change in your medication:   You may increase your isosorbide mononitrate (imdur) back to 60 mg once a day   *If you need a refill on your cardiac medications before your next appointment, please call your pharmacy*   Lab Work: None ordered  If you have labs (blood work) drawn today and your tests are completely normal, you will receive your results only by: Marland Kitchen MyChart Message (if you have MyChart) OR . A paper copy in the mail If you have any lab test that is abnormal or we need to change your treatment, we will call you to review the results.   Testing/Procedures: None ordered   Follow-Up: At [facility], you and your health needs are our priority.  As part of our continuing mission to provide you with exceptional heart care, we have created designated Provider Care Teams.  These Care Teams include your primary Cardiologist (physician) and Advanced Practice Providers (APPs -  Physician Assistants and Nurse Practitioners) who all work together to provide you with the care you need, when you need it.  We recommend signing up for the patient portal called "MyChart".  Sign up information is provided on this After Visit Summary.  MyChart is used to connect with patients for Virtual Visits (Telemedicine).  Patients are able to view lab/test results, encounter notes, upcoming appointments, etc.  Non-urgent messages can be sent to your provider as well.   To learn more about what you can do with MyChart, go to [email or web address].    Your next appointment:   12 month(s)  The format for your next appointment:   In Person  Provider:   You may see Larae Grooms, MD or one of the following Advanced Practice Providers on your designated Care Team:    Melina Copa, PA-C  Ermalinda Barrios, PA-C    Other Instructions  High-Fiber Diet Fiber, also called dietary fiber, is a type of carbohydrate that is  found in fruits, vegetables, whole grains, and beans. A high-fiber diet can have many health benefits. Your health care provider may recommend a high-fiber diet to help:  Prevent constipation. Fiber can make your bowel movements more regular.  Lower your cholesterol.  Relieve the following conditions: ? Swelling of veins in the anus (hemorrhoids). ? Swelling and irritation (inflammation) of specific areas of the digestive tract (uncomplicated diverticulosis). ? A problem of the large intestine (colon) that sometimes causes pain and diarrhea (irritable bowel syndrome, IBS).  Prevent overeating as part of a weight-loss plan.  Prevent heart disease, type 2 diabetes, and certain cancers. What is my plan? The recommended daily fiber intake in grams (g) includes:  38 g for men age 72 or younger.  30 g for men over age 97.  76 g for women age 58 or younger.  21 g for women over age 33. You can get the recommended daily intake of dietary fiber by:  Eating a variety of fruits, vegetables, grains, and beans.  Taking a fiber supplement, if it is not possible to get enough fiber through your diet. What do I need to know about a high-fiber diet?  It is better to get fiber through food sources rather than from fiber supplements. There is not a lot of research about how effective supplements are.  Always check the fiber content on the nutrition facts label of any prepackaged food. Look for foods that contain 5 g of fiber or more per serving.  Talk  with a diet and nutrition specialist (dietitian) if you have questions about specific foods that are recommended or not recommended for your medical condition, especially if those foods are not listed below.  Gradually increase how much fiber you consume. If you increase your intake of dietary fiber too quickly, you may have bloating, cramping, or gas.  Drink plenty of water. Water helps you to digest fiber. What are tips for following this plan?   Eat a wide variety of high-fiber foods.  Make sure that half of the grains that you eat each day are whole grains.  Eat breads and cereals that are made with whole-grain flour instead of refined flour or white flour.  Eat brown rice, bulgur wheat, or millet instead of white rice.  Start the day with a breakfast that is high in fiber, such as a cereal that contains 5 g of fiber or more per serving.  Use beans in place of meat in soups, salads, and pasta dishes.  Eat high-fiber snacks, such as berries, raw vegetables, nuts, and popcorn.  Choose whole fruits and vegetables instead of processed forms like juice or sauce. What foods can I eat?  Fruits Berries. Pears. Apples. Oranges. Avocado. Prunes and raisins. Dried figs. Vegetables Sweet potatoes. Spinach. Kale. Artichokes. Cabbage. Broccoli. Cauliflower. Green peas. Carrots. Squash. Grains Whole-grain breads. Multigrain cereal. Oats and oatmeal. Brown rice. Barley. Bulgur wheat. [place]. Quinoa. Bran muffins. Popcorn. Rye wafer crackers. Meats and other proteins Navy, kidney, and pinto beans. Soybeans. Split peas. Lentils. Nuts and seeds. Dairy Fiber-fortified yogurt. Beverages Fiber-fortified soy milk. Fiber-fortified orange juice. Other foods Fiber bars. The items listed above may not be a complete list of recommended foods and beverages. Contact a dietitian for more options. What foods are not recommended? Fruits Fruit juice. Cooked, strained fruit. Vegetables Fried potatoes. Canned vegetables. Well-cooked vegetables. Grains White bread. Pasta made with refined flour. White rice. Meats and other proteins Fatty cuts of meat. Fried chicken or fried fish. Dairy Milk. Yogurt. Cream cheese. Sour cream. Fats and oils Butters. Beverages Soft drinks. Other foods Cakes and pastries. The items listed above may not be a complete list of foods and beverages to avoid. Contact a dietitian for more information. Summary  Fiber  is a type of carbohydrate. It is found in fruits, vegetables, whole grains, and beans.  There are many health benefits of eating a high-fiber diet, such as preventing constipation, lowering blood cholesterol, helping with weight loss, and reducing your risk of heart disease, diabetes, and certain cancers.  Gradually increase your intake of fiber. Increasing too fast can result in cramping, bloating, and gas. Drink plenty of water while you increase your fiber.  The best sources of fiber include whole fruits and vegetables, whole grains, nuts, seeds, and beans. This information is not intended to replace advice given to you by your health care provider. Make sure you discuss any questions you have with your health care provider. Document Revised: 09/26/2017 Document Reviewed: 09/26/2017 Elsevier Patient Education  2020 [employer].

## 2020-03-24 MED FILL — ELIQUIS 5 MG TABLET: 5 | 30 days supply | Qty: 60 | Fill #4

## 2020-03-25 ENCOUNTER — Ambulatory Visit: Payer: Medicare PPO | Admitting: Family Medicine

## 2020-03-25 DIAGNOSIS — Z0289 Encounter for other administrative examinations: Secondary | ICD-10-CM

## 2020-03-25 MED FILL — NOVOLOG FLEXPEN SYRINGE: 100 | 7 days supply | Qty: 15 | Fill #8

## 2020-03-25 MED FILL — LEVOTHYROXINE 100 MCG TAB: 100 | 90 days supply | Qty: 90 | Fill #1

## 2020-03-25 MED FILL — RANOLAZINE ER 500 MG TABLET: 500 | 30 days supply | Qty: 60 | Fill #1

## 2020-03-25 MED FILL — NITROGLYCERIN 0.4 MG TAB SL: 0.4 | 7 days supply | Qty: 25 | Fill #1

## 2020-03-25 MED FILL — FUROSEMIDE 20 MG TAB: 20 | 30 days supply | Qty: 30 | Fill #3

## 2020-04-02 MED FILL — FREESTYLE LIBRE 14 DAY SENS: 28 days supply | Qty: 2 | Fill #5

## 2020-04-04 ENCOUNTER — Encounter (HOSPITAL_BASED_OUTPATIENT_CLINIC_OR_DEPARTMENT_OTHER): Payer: Self-pay

## 2020-04-04 ENCOUNTER — Other Ambulatory Visit: Payer: Self-pay

## 2020-04-04 ENCOUNTER — Ambulatory Visit (INDEPENDENT_AMBULATORY_CARE_PROVIDER_SITE_OTHER): Payer: Medicare PPO | Admitting: Family Medicine

## 2020-04-04 ENCOUNTER — Ambulatory Visit (HOSPITAL_BASED_OUTPATIENT_CLINIC_OR_DEPARTMENT_OTHER)
Admission: RE | Admit: 2020-04-04 | Discharge: 2020-04-04 | Disposition: A | Discharge status: Home / Self Care | Payer: Medicare PPO | Source: Ambulatory Visit | Attending: Family Medicine | Admitting: Family Medicine

## 2020-04-04 ENCOUNTER — Encounter: Payer: Self-pay | Admitting: Family Medicine

## 2020-04-04 VITALS — Wt 210.0 lb

## 2020-04-04 DIAGNOSIS — M549 Dorsalgia, unspecified: Secondary | ICD-10-CM

## 2020-04-04 DIAGNOSIS — M545 Low back pain, unspecified: Secondary | ICD-10-CM

## 2020-04-04 DIAGNOSIS — E785 Hyperlipidemia, unspecified: Secondary | ICD-10-CM

## 2020-04-04 DIAGNOSIS — I10 Essential (primary) hypertension: Secondary | ICD-10-CM | POA: Diagnosis not present

## 2020-04-04 DIAGNOSIS — E1165 Type 2 diabetes mellitus with hyperglycemia: Secondary | ICD-10-CM

## 2020-04-06 NOTE — Assessment & Plan Note (Signed)
Tolerating statin, encouraged heart healthy diet, avoid trans fats, minimize simple carbs and saturated fats. Increase exercise as tolerated 

## 2020-04-06 NOTE — Assessment & Plan Note (Signed)
minimize simple carbs. Increase exercise as tolerated. Continue current meds  

## 2020-04-06 NOTE — Assessment & Plan Note (Signed)
Monitor and report any concerns, no changes to meds. Encouraged heart healthy diet such as the DASH diet and exercise as tolerated.  ?

## 2020-04-06 NOTE — Progress Notes (Signed)
Virtual Visit via phone Note  I connected with [name] [name] on 04/04/20  at 10:00 AM EDT by a phone enabled telemedicine application and verified that I am speaking with the correct person using two identifiers.  Location: Patient: home Provider: home   I discussed the limitations of evaluation and management by telemedicine and the availability of in person appointments. The patient expressed understanding and agreed to proceed. Kem Boroughs, CMA was able to get the patient setup on a phone visit after being unable to set up a video visit   Subjective:    Patient ID: [name] [name], male    DOB: [birth year], 74 y.o.   MRN: [id number]  Chief Complaint  Patient presents with  . Follow-up    6 month     HPI Patient is in today for follow up on chronic medical concerns and he is having a flare in his back pain with radiculopathy in both legs. No recent fall or trauma. No incontinence of bowel or urine. He notes pain can radiate to both hips and down both lets and he is experiencing increased numbness in his feet also. It is better when he sits or lies down. He is also noting excessive fatiue and some increase in anxiety secondary to pain. Denies CP/palp/SOB/HA/congestion/fevers/GI or GU c/o. Taking meds as prescribed  Past Medical History:  Diagnosis Date  . Acquired atrophy of thyroid 06/05/2013   Overview:  July 2014: controlled on synthroid 14mcg since 2012 May 2015: decreased to Synthroid 50 mcg  Last Assessment & Plan:  Pt doing well with most recent TSH within normal range.  Will continue current dosage of Synthroid 35mcg. Pt reminded to take medication on empty stomach. Will continue to monitor with periodic laboratory assessment in 3 months.   . Anemia    hemoglobin 7.4, iron deficiency, January, 2011, 2 unit transfusion, endoscopy normal, capsule endoscopy February, 2011 no small bowel abnormalities.   Most likely source gastric erosions, followed by GI  . Anxiety   .  Bradycardia   . CAD (coronary artery disease)    A. CABG in 2000,status post cardiac cath in 2006, 2009 ....continued chest pain and SOB despite oral medication adjestments including Ranexa. B. Cath November 2009/ mRCA - 2.75 x 23 Abbott Xience V drug-eluting stent ...11/26/2008 to distal  RCA leading to acute marginal.  C. Cath 07/2012 for CP - stable anatomy, med rx. d. cath 2015 and 05/30/2015 stable anatomy, consider Myoview if has CP again  . Carotid artery disease ([place]) 08/01/2015   Doppler, May 29, 2015, 1-39% bilateral ICA   . Cerebral ischemia    MRI November, 2010, chronic microvascular ischemia  . CKD (chronic kidney disease), stage III   . Concussion   . Depression    Bipolar  . Diabetes mellitus ([place])   . Edema   . Essential hypertension 06/05/2013   Overview:  July 2014: Controlled with Bystolic Sep 5397: Changed to atenolol.  Last Assessment & Plan:  Will change to atenolol for cost and follow Improved.  Medication compliance strongly encouraged BP: 116/64 mmHg    Overview:  July 2014: Controlled with Bystolic Sep 6734: Changed to atenolol.  Last Assessment & Plan:  Will change to atenolol for cost and follow  . Falling episodes    these have occurred in the past and again recurring 2011  . Family history of adverse reaction to anesthesia    "mother died during bypass surgery but not sure if it has to do with  anesthesia"  . Gastric ulcer   . GERD (gastroesophageal reflux disease)   . H/O medication noncompliance    Due to loss of insurance  . H/O multiple concussions   . Hard of hearing   . History of blood transfusion 12/20/2013  . Hx of CABG    2000,  / one median sternotomy suture broken her chest x-ray November, 2010, no clinical significance  . Hyperlipidemia   . Hypertension    pt. denies  . Iron deficiency anemia   . Long term (current) use of anticoagulants [Z79.01] 07/20/2016  . Low back pain 06/12/2009   Qualifier: Diagnosis of  By: [name]   .  Nephrolithiasis   . Orthostasis   . OSA (obstructive sleep apnea)   . PAF (paroxysmal atrial fibrillation) ([place])    a. dx 2017, started on amiodarone/warfarin but patient intermittently noncompliant.  Marland Kitchen PSVT (paroxysmal supraventricular tachycardia) ([place])   . RBBB 07/09/2009   Qualifier: Diagnosis of  By: [name], MD, [name] [name]   . RLS (restless legs syndrome) 09/19/2009   Qualifier: Diagnosis of  By: [name]   Overview:  July 2014: Controlled with Mirapex  Last Assessment & Plan:  Patient is doing well. Will continue current management and follow clinically.  . Seizure disorder ([place]) 01/09/2017  . Spondylosis    C5-6, C6-7 MRI 2010  . Syncope 03/2016  . Thyroid disease   . Tubulovillous adenoma of colon 2007  . Vitamin D deficiency 05/17/2017  . Wears glasses     Past Surgical History:  Procedure Laterality Date  . ANTERIOR CERVICAL DECOMP/DISCECTOMY FUSION N/A 05/31/2016   Procedure: ANTERIOR CERVICAL DECOMPRESSION/DISCECTOMY FUSION CERVICAL FIVE-SIX,CERVICAL SIX-SEVEN;  Surgeon: [name], MD;  Location: [place] [facility];  Service: Neurosurgery;  Laterality: N/A;  . CARDIAC CATHETERIZATION N/A 05/30/2015   Procedure: Left Heart Cath and Coronary Angiography;  Surgeon: [name], MD;  Location: [place] CV LAB;  Service: Cardiovascular;  Laterality: N/A;  . COLONOSCOPY    . CORONARY ARTERY BYPASS GRAFT     2000  . ESOPHAGOGASTRODUODENOSCOPY    . LEFT HEART CATH AND CORS/GRAFTS ANGIOGRAPHY N/A 12/15/2017   Procedure: LEFT HEART CATH AND CORS/GRAFTS ANGIOGRAPHY;  Surgeon: [name], MD;  Location: [place] CV LAB;  Service: Cardiovascular;  Laterality: N/A;  . LEFT HEART CATHETERIZATION WITH CORONARY/GRAFT ANGIOGRAM N/A 08/01/2012   Procedure: LEFT HEART CATHETERIZATION WITH Beatrix Fetters;  Surgeon: [name], MD;  Location: [facility] CATH LAB;  Service: Cardiovascular;  Laterality: N/A;  . LEFT HEART CATHETERIZATION WITH CORONARY/GRAFT  ANGIOGRAM N/A 01/03/2015   Procedure: LEFT HEART CATHETERIZATION WITH Beatrix Fetters;  Surgeon: [name], MD;  Location: [facility] CATH LAB;  Service: Cardiovascular;  Laterality: N/A;  . NASAL SEPTUM SURGERY     UP3  . PERCUTANEOUS CORONARY STENT INTERVENTION (PCI-S)  10/2008   mRCA PCI  2.75 x 23 Abbott Xience V drug-eluting stent   . RIGHT/LEFT HEART CATH AND CORONARY/GRAFT ANGIOGRAPHY N/A 01/07/2020   Procedure: RIGHT/LEFT HEART CATH AND CORONARY/GRAFT ANGIOGRAPHY;  Surgeon: [name], MD;  Location: [place] CV LAB;  Service: Cardiovascular;  Laterality: N/A;  . ULTRASOUND GUIDANCE FOR VASCULAR ACCESS  12/15/2017   Procedure: Ultrasound Guidance For Vascular Access;  Surgeon: [name], MD;  Location: [place] CV LAB;  Service: Cardiovascular;;    Family History  Problem Relation Age of Onset  . Pancreatic cancer Brother   . Diabetes Brother   . Coronary artery disease  Brother   . Stroke Brother   . Diabetes Brother   . Diabetes Mother   . Heart failure Mother   . Heart failure Father   . Hypothyroidism Brother   . Coronary artery disease Brother   . Other Brother        colon surgery  . Heart attack Other        Nephew  . Irregular heart beat Daughter   . Cancer Maternal Grandmother        unknown   . Colon cancer Neg Hx   . Stomach cancer Neg Hx   . Liver cancer Neg Hx   . Rectal cancer Neg Hx   . Esophageal cancer Neg Hx     Social History   Socioeconomic History  . Marital status: Married    Spouse name: Not on file  . Number of children: 4  . Years of education: 35  . Highest education level: Not on file  Occupational History  . Occupation: retired  Tobacco Use  . Smoking status: Never Smoker  . Smokeless tobacco: Never Used  Substance and Sexual Activity  . Alcohol use: No    Alcohol/week: 0.0 standard drinks    Comment: stopped drinking in 1998  . Drug use: No  . Sexual activity: Not on file  Other Topics Concern  .  Not on file  Social History Narrative   Patient is right handed.   Patient does not drink caffeine.   Social Determinants of Health   Financial Resource Strain:   . Difficulty of Paying Living Expenses:   Food Insecurity:   . Worried About Charity fundraiser in the Last Year:   . Arboriculturist in the Last Year:   Transportation Needs:   . Film/video editor (Medical):   Marland Kitchen Lack of Transportation (Non-Medical):   Physical Activity:   . Days of Exercise per Week:   . Minutes of Exercise per Session:   Stress:   . Feeling of Stress :   Social Connections:   . Frequency of Communication with Friends and Family:   . Frequency of Social Gatherings with Friends and Family:   . Attends Religious Services:   . Active Member of Clubs or Organizations:   . Attends Archivist Meetings:   Marland Kitchen Marital Status:   Intimate Partner Violence:   . Fear of Current or Ex-Partner:   . Emotionally Abused:   Marland Kitchen Physically Abused:   . Sexually Abused:     Outpatient Medications Prior to Visit  Medication Sig Dispense Refill  . albuterol (VENTOLIN HFA) 108 (90 Base) MCG/ACT inhaler Inhale 2 puffs into the lungs every 6 (six) hours as needed for wheezing or shortness of breath. 18 g 2  . amLODipine (NORVASC) 5 MG tablet Take 1 tablet (5 mg total) by mouth daily. 90 tablet 1  . atorvastatin (LIPITOR) 80 MG tablet TAKE 1 TABLET (80 MG TOTAL) BY MOUTH DAILY. 90 tablet 3  . Continuous Blood Gluc Sensor (FREESTYLE LIBRE 14 DAY SENSOR) MISC 1 Device by Does not apply route every 14 (fourteen) days. 6 each 3  . divalproex (DEPAKOTE ER) 250 MG 24 hr tablet Take 3 tablets (750 mg total) by mouth at bedtime. 180 tablet 0  . ELIQUIS 5 MG TABS tablet TAKE 1 TABLET (5 MG TOTAL) BY MOUTH 2 (TWO) TIMES DAILY. (Patient taking differently: Take 5 mg by mouth 2 (two) times daily. ) 60 tablet 5  . fenofibrate (TRICOR) 145 MG  tablet TAKE 1 TABLET BY MOUTH ONCE DAILY **DISCONTINUE 130MG ** 30 tablet 3  .  ferrous sulfate 325 (65 FE) MG tablet Take 1 tablet (325 mg total) by mouth every other day.  3  . furosemide (LASIX) 20 MG tablet TAKE 1 TABLET BY MOUTH DAILY AS NEEDED FOR EDEMA (Patient taking differently: Take 20 mg by mouth daily. ) 30 tablet 3  . glucose blood (ACCU-CHEK AVIVA) test strip Use as directed to check blood sugar 4 times daily.  DX E11.9 200 each 6  . insulin aspart (NOVOLOG FLEXPEN) 100 UNIT/ML FlexPen 3 times a day (just before each meal) 65-70-130 units 30 pen 11  . insulin NPH Human (NOVOLIN N) 100 UNIT/ML injection Inject 0.8 mLs (80 Units total) into the skin at bedtime. 30 mL 11  . Insulin Pen Needle 31G X 8 MM MISC 1 each by Does not apply route 3 (three) times daily. E11.9 60 each 0  . INSULIN SYRINGE 1CC/29G 29G X 1/2" 1 ML MISC 1 each by Does not apply route daily. Use to inject insulin daily 30 each 0  . isosorbide mononitrate (IMDUR) 60 MG 24 hr tablet Take 1 tablet (60 mg total) by mouth daily. 90 tablet 3  . levothyroxine (SYNTHROID) 100 MCG tablet TAKE 1 TABLET (100 MCG TOTAL) BY MOUTH DAILY. 90 tablet 1  . metoprolol succinate (TOPROL-XL) 50 MG 24 hr tablet TAKE 1 AND 1/2 TABLETS EVERY DAY FOR A TOTAL OF 75 MG 135 tablet 3  . nitroGLYCERIN (NITROSTAT) 0.4 MG SL tablet PLACE 1 TABLET UNDER THE TONGUE EVERY 5 MINUTES AS NEEDED FOR CHEST PAIN. (Patient taking differently: Place 0.4 mg under the tongue every 5 (five) minutes as needed for chest pain. ) 25 tablet 1  . pantoprazole (PROTONIX) 40 MG tablet Take 1 tablet (40 mg total) by mouth daily. 90 tablet 0  . pramipexole (MIRAPEX) 1 MG tablet TAKE 1/2 TO 1 TABLET AT BEDTIME 90 tablet 0  . ranolazine (RANEXA) 500 MG 12 hr tablet TAKE 1 TABLET BY MOUTH TWICE DAILY 60 tablet 5  . rOPINIRole (REQUIP) 1 MG tablet TAKE 1 TABLET (1 MG TOTAL) BY MOUTH AT BEDTIME. 30 tablet 1  . sertraline (ZOLOFT) 100 MG tablet TAKE 1 TABLET (100 MG TOTAL) BY MOUTH 2 (TWO) TIMES DAILY. 180 tablet 1  . tiZANidine (ZANAFLEX) 4 MG tablet TAKE 1  TABLET (4 MG TOTAL) BY MOUTH EVERY 6 (SIX) HOURS AS NEEDED FOR MUSCLE SPASMS. 40 tablet 2  . Vitamin D, Ergocalciferol, (DRISDOL) 1.25 MG (50000 UT) CAPS capsule Take 1 capsule (50,000 Units total) by mouth every 7 (seven) days. 20 capsule 0   No facility-administered medications prior to visit.    Allergies  Allergen Reactions  . Morphine Other (See Comments)    hallucinations  . Shellfish-Derived Products Itching    Review of Systems  Constitutional: Positive for malaise/fatigue. Negative for fever.  HENT: Negative for congestion.   Eyes: Negative for blurred vision.  Respiratory: Negative for shortness of breath.   Cardiovascular: Negative for chest pain, palpitations and leg swelling.  Gastrointestinal: Negative for abdominal pain, blood in stool and nausea.  Genitourinary: Negative for dysuria and frequency.  Musculoskeletal: Positive for back pain and myalgias. Negative for falls.  Skin: Negative for rash.  Neurological: Positive for tingling. Negative for dizziness, loss of consciousness and headaches.  Endo/Heme/Allergies: Negative for environmental allergies.  Psychiatric/Behavioral: Negative for depression. The patient is nervous/anxious.        Objective:    Physical Exam  unable to obtain via phone visit.   Wt 210 lb (95.3 kg)   BMI 31.01 kg/m  Wt Readings from Last 3 Encounters:  04/04/20 210 lb (95.3 kg)  03/18/20 218 lb 12.8 oz (99.2 kg)  01/22/20 214 lb 12.8 oz (97.4 kg)    Diabetic Foot Exam - Simple   No data filed     Lab Results  Component Value Date   WBC 6.6 01/08/2020   HGB 12.7 (L) 01/08/2020   HCT 36.3 (L) 01/08/2020   PLT 197 01/08/2020   GLUCOSE 223 (H) 01/22/2020   CHOL 123 12/27/2019   TRIG 117.0 12/27/2019   HDL 34.50 (L) 12/27/2019   LDLDIRECT 77.0 08/20/2019   LDLCALC 65 12/27/2019   ALT 31 01/05/2020   AST 30 01/05/2020   NA 140 01/22/2020   K 4.5 01/22/2020   CL 105 01/22/2020   CREATININE 1.75 (H) 01/22/2020   BUN 18  01/22/2020   CO2 20 01/22/2020   TSH 4.38 12/27/2019   PSA 3.14 08/20/2019   INR 2.7 04/23/2019   HGBA1C 7.4 (A) 11/07/2019   MICROALBUR <0.7 05/14/2019    Lab Results  Component Value Date   TSH 4.38 12/27/2019   Lab Results  Component Value Date   WBC 6.6 01/08/2020   HGB 12.7 (L) 01/08/2020   HCT 36.3 (L) 01/08/2020   MCV 90.3 01/08/2020   PLT 197 01/08/2020   Lab Results  Component Value Date   NA 140 01/22/2020   K 4.5 01/22/2020   CO2 20 01/22/2020   GLUCOSE 223 (H) 01/22/2020   BUN 18 01/22/2020   CREATININE 1.75 (H) 01/22/2020   BILITOT 1.1 01/05/2020   ALKPHOS 48 01/05/2020   AST 30 01/05/2020   ALT 31 01/05/2020   PROT 7.1 01/05/2020   ALBUMIN 4.1 01/05/2020   CALCIUM 9.4 01/22/2020   ANIONGAP 8 01/08/2020   GFR 34.26 (L) 12/27/2019   Lab Results  Component Value Date   CHOL 123 12/27/2019   Lab Results  Component Value Date   HDL 34.50 (L) 12/27/2019   Lab Results  Component Value Date   LDLCALC 65 12/27/2019   Lab Results  Component Value Date   TRIG 117.0 12/27/2019   Lab Results  Component Value Date   CHOLHDL 4 12/27/2019   Lab Results  Component Value Date   HGBA1C 7.4 (A) 11/07/2019       Assessment & Plan:   Problem List Items Addressed This Visit    Diabetes mellitus type II, uncontrolled ([place]) (Chronic)     minimize simple carbs. Increase exercise as tolerated. Continue current meds      Low back pain potentially associated with radiculopathy    Mid to low back hurts to both hips and legs. Sitting and lying down helps he notes numbness in both feet at times. Will start with xrays of thoracic and lumbar spine and then will likely progress to advanced imaging due to symptoms. He will seek care if worsens.       Hyperlipidemia LDL goal <70    Tolerating statin, encouraged heart healthy diet, avoid trans fats, minimize simple carbs and saturated fats. Increase exercise as tolerated      Essential hypertension    Monitor  and report any concerns, no changes to meds. Encouraged heart healthy diet such as the DASH diet and exercise as tolerated.        Other Visit Diagnoses    Back pain, unspecified back location, unspecified back pain laterality, unspecified  chronicity    -  Primary   Relevant Orders   DG Lumbar Spine 2-3 Views   DG Thoracic Spine 2 View      I am having Takuma A. Najjar maintain his divalproex, Vitamin D (Ergocalciferol), glucose blood, ferrous sulfate, albuterol, INSULIN SYRINGE 1CC/29G, FreeStyle Libre 14 Day Sensor, insulin NPH Human, sertraline, Insulin Pen Needle, Eliquis, furosemide, nitroGLYCERIN, levothyroxine, amLODipine, fenofibrate, pantoprazole, pramipexole, NovoLOG FlexPen, tiZANidine, metoprolol succinate, ranolazine, atorvastatin, rOPINIRole, and isosorbide mononitrate.  No orders of the defined types were placed in this encounter.     I discussed the assessment and treatment plan with the patient. The patient was provided an opportunity to ask questions and all were answered. The patient agreed with the plan and demonstrated an understanding of the instructions.   The patient was advised to call back or seek an in-person evaluation if the symptoms worsen or if the condition fails to improve as anticipated.  I provided 20 minutes of non-face-to-face time during this encounter.   Penni Homans, MD

## 2020-04-06 NOTE — Assessment & Plan Note (Signed)
Mid to low back hurts to both hips and legs. Sitting and lying down helps he notes numbness in both feet at times. Will start with xrays of thoracic and lumbar spine and then will likely progress to advanced imaging due to symptoms. He will seek care if worsens.

## 2020-04-07 MED FILL — tiZANidine HCL 4 MG TABS: 4 | 10 days supply | Qty: 40 | Fill #1

## 2020-04-07 MED FILL — AMLODIPINE BESYLATE 5 MG TA: 5 | 90 days supply | Qty: 90 | Fill #0

## 2020-04-07 MED FILL — rOPINIRole HCL 1 MG TABS: 1 | 30 days supply | Qty: 30 | Fill #1

## 2020-04-07 MED FILL — FENOFIBRATE 145 MG TABS: 145 | 30 days supply | Qty: 30 | Fill #3

## 2020-04-16 ENCOUNTER — Other Ambulatory Visit: Payer: Self-pay

## 2020-04-16 ENCOUNTER — Ambulatory Visit (HOSPITAL_BASED_OUTPATIENT_CLINIC_OR_DEPARTMENT_OTHER)
Admission: RE | Admit: 2020-04-16 | Discharge: 2020-04-16 | Disposition: A | Discharge status: Home / Self Care | Payer: Medicare PPO | Source: Ambulatory Visit | Attending: Family Medicine | Admitting: Family Medicine

## 2020-04-16 DIAGNOSIS — M47816 Spondylosis without myelopathy or radiculopathy, lumbar region: Secondary | ICD-10-CM | POA: Diagnosis not present

## 2020-04-16 DIAGNOSIS — M549 Dorsalgia, unspecified: Secondary | ICD-10-CM | POA: Insufficient documentation

## 2020-04-16 DIAGNOSIS — M546 Pain in thoracic spine: Secondary | ICD-10-CM | POA: Diagnosis not present

## 2020-04-24 MED FILL — ULTCARE INS SYR 1 ML 31GX5/: 31G X 5/16" | 90 days supply | Qty: 100 | Fill #0

## 2020-04-28 ENCOUNTER — Telehealth: Payer: Self-pay

## 2020-04-28 NOTE — Telephone Encounter (Signed)
MEDICAL RECORDS REQUEST  Company requesting medical records: [employer]  Document received: Signed medical records request  Forwarded above request to HIM. Document and fax confirmation have been placed in the faxed file for future reference.  

## 2020-04-29 ENCOUNTER — Other Ambulatory Visit: Payer: Self-pay | Admitting: Family Medicine

## 2020-04-29 MED FILL — RANOLAZINE ER 500 MG TABLET: 500 | 30 days supply | Qty: 60 | Fill #2

## 2020-04-29 MED FILL — SERTRALINE HCL 100 MG TABS: 100 | 90 days supply | Qty: 180 | Fill #0

## 2020-04-29 MED FILL — ELIQUIS 5 MG TABLET: 5 | 30 days supply | Qty: 60 | Fill #5

## 2020-04-30 MED FILL — rOPINIRole HCL 1 MG TABS: 1 | 30 days supply | Qty: 30 | Fill #0

## 2020-05-02 MED FILL — NovoLIN N 100 UNIT/ML SUSP: 100 | 17 days supply | Qty: 10 | Fill #1

## 2020-05-02 MED FILL — NOVOLOG FLEXPEN SYRINGE: 100 | 7 days supply | Qty: 15 | Fill #10

## 2020-05-07 ENCOUNTER — Other Ambulatory Visit: Payer: Self-pay | Admitting: Family Medicine

## 2020-05-07 MED FILL — FUROSEMIDE 20 MG TAB: 20 | 30 days supply | Qty: 30 | Fill #0

## 2020-05-09 ENCOUNTER — Ambulatory Visit (INDEPENDENT_AMBULATORY_CARE_PROVIDER_SITE_OTHER): Payer: Medicare PPO | Admitting: Family Medicine

## 2020-05-09 ENCOUNTER — Other Ambulatory Visit: Payer: Self-pay

## 2020-05-09 ENCOUNTER — Telehealth: Payer: Self-pay | Admitting: *Deleted

## 2020-05-09 ENCOUNTER — Encounter: Payer: Self-pay | Admitting: Family Medicine

## 2020-05-09 VITALS — Wt 209.0 lb

## 2020-05-09 DIAGNOSIS — E1165 Type 2 diabetes mellitus with hyperglycemia: Secondary | ICD-10-CM | POA: Diagnosis not present

## 2020-05-09 DIAGNOSIS — E785 Hyperlipidemia, unspecified: Secondary | ICD-10-CM | POA: Diagnosis not present

## 2020-05-09 DIAGNOSIS — R519 Headache, unspecified: Secondary | ICD-10-CM

## 2020-05-09 DIAGNOSIS — E559 Vitamin D deficiency, unspecified: Secondary | ICD-10-CM

## 2020-05-09 DIAGNOSIS — I10 Essential (primary) hypertension: Secondary | ICD-10-CM

## 2020-05-09 DIAGNOSIS — E782 Mixed hyperlipidemia: Secondary | ICD-10-CM | POA: Diagnosis not present

## 2020-05-09 DIAGNOSIS — M545 Low back pain, unspecified: Secondary | ICD-10-CM

## 2020-05-09 DIAGNOSIS — I25111 Atherosclerotic heart disease of native coronary artery with angina pectoris with documented spasm: Secondary | ICD-10-CM | POA: Diagnosis not present

## 2020-05-09 DIAGNOSIS — G4710 Hypersomnia, unspecified: Secondary | ICD-10-CM | POA: Diagnosis not present

## 2020-05-09 DIAGNOSIS — M546 Pain in thoracic spine: Secondary | ICD-10-CM | POA: Diagnosis not present

## 2020-05-09 DIAGNOSIS — N184 Chronic kidney disease, stage 4 (severe): Secondary | ICD-10-CM

## 2020-05-09 NOTE — Telephone Encounter (Signed)
Spoke with patient to try and schedule he was out eating and said he will call back.  If he calls back needs lab appointment soon maybe next week and follow in 3 months in person with Dr. Charlett Blake.

## 2020-05-11 DIAGNOSIS — M546 Pain in thoracic spine: Secondary | ICD-10-CM | POA: Insufficient documentation

## 2020-05-11 NOTE — Assessment & Plan Note (Signed)
Hydrate and monitor 

## 2020-05-11 NOTE — Assessment & Plan Note (Addendum)
After reviewing xray will proceed with MR, he does have some radicular pain down both legs. No incontinence

## 2020-05-11 NOTE — Assessment & Plan Note (Signed)
Tolerating statin, encouraged heart healthy diet, avoid trans fats, minimize simple carbs and saturated fats. Increase exercise as tolerated 

## 2020-05-11 NOTE — Assessment & Plan Note (Signed)
Monitor and report any concerns, no changes to meds. Encouraged heart healthy diet such as the DASH diet and exercise as tolerated.  ?

## 2020-05-11 NOTE — Assessment & Plan Note (Signed)
He has been seen by neurology and he reports they have agreed he has a component of post concussion headache, he agrees to follow up with them if headaches worsens

## 2020-05-11 NOTE — Assessment & Plan Note (Signed)
After reviewing xray will proceed with MR

## 2020-05-11 NOTE — Assessment & Plan Note (Signed)
hgba1c acceptable, minimize simple carbs. Increase exercise as tolerated. Continue current meds, repeat labs ordered. He missed an appointment with endocrinology due to hospitalization of his wife. He agrees to follow up and make an appointment

## 2020-05-11 NOTE — Progress Notes (Signed)
Virtual Visit via phone Note  I connected with [name] [name] on 05/09/20 at  9:40 AM EDT by a  enabled telemedicine application and verified that I am speaking with the correct person using two identifiers.  Location: Patient: home Provider: home, patient and provider were both in visit   I discussed the limitations of evaluation and management by telemedicine and the availability of in person appointments. The patient expressed understanding and agreed to proceed. Kem Boroughs, CMA was able to get the patient set upon a phone visit after being unable to set up a video visit.   Subjective:    Patient ID: [name] [name], male    DOB: [birth year], 74 y.o.   MRN: [id number]  Chief Complaint  Patient presents with  . Back Pain    HPI Patient is in today for follow up on chronic medical concerns. No recent febrile illness or hospitalizations. He continues to struggle with headaches although they are some better, he has been seen by neurology and they believe his history of concussions is playing a role. He has worsening back pain, mid to low back pain. No recent fall or trauma. No incontinence. Denies CP/palp/HA/congestion/fevers/GI or GU c/o. Taking meds as prescribed. Does note some pain down bilateral legs.   Past Medical History:  Diagnosis Date  . Acquired atrophy of thyroid 06/05/2013   Overview:  July 2014: controlled on synthroid 83mcg since 2012 May 2015: decreased to Synthroid 50 mcg  Last Assessment & Plan:  Pt doing well with most recent TSH within normal range.  Will continue current dosage of Synthroid 76mcg. Pt reminded to take medication on empty stomach. Will continue to monitor with periodic laboratory assessment in 3 months.   . Anemia    hemoglobin 7.4, iron deficiency, January, 2011, 2 unit transfusion, endoscopy normal, capsule endoscopy February, 2011 no small bowel abnormalities.   Most likely source gastric erosions, followed by GI  . Anxiety   . Bradycardia    . CAD (coronary artery disease)    A. CABG in 2000,status post cardiac cath in 2006, 2009 ....continued chest pain and SOB despite oral medication adjestments including Ranexa. B. Cath November 2009/ mRCA - 2.75 x 23 Abbott Xience V drug-eluting stent ...11/26/2008 to distal  RCA leading to acute marginal.  C. Cath 07/2012 for CP - stable anatomy, med rx. d. cath 2015 and 05/30/2015 stable anatomy, consider Myoview if has CP again  . Carotid artery disease ([place]) 08/01/2015   Doppler, May 29, 2015, 1-39% bilateral ICA   . Cerebral ischemia    MRI November, 2010, chronic microvascular ischemia  . CKD (chronic kidney disease), stage III   . Concussion   . Depression    Bipolar  . Diabetes mellitus ([place])   . Edema   . Essential hypertension 06/05/2013   Overview:  July 2014: Controlled with Bystolic Sep 6160: Changed to atenolol.  Last Assessment & Plan:  Will change to atenolol for cost and follow Improved.  Medication compliance strongly encouraged BP: 116/64 mmHg    Overview:  July 2014: Controlled with Bystolic Sep 7371: Changed to atenolol.  Last Assessment & Plan:  Will change to atenolol for cost and follow  . Falling episodes    these have occurred in the past and again recurring 2011  . Family history of adverse reaction to anesthesia    "mother died during bypass surgery but not sure if it has to do with anesthesia"  . Gastric ulcer   . GERD (gastroesophageal  reflux disease)   . H/O medication noncompliance    Due to loss of insurance  . H/O multiple concussions   . Hard of hearing   . History of blood transfusion 12/20/2013  . Hx of CABG    2000,  / one median sternotomy suture broken her chest x-ray November, 2010, no clinical significance  . Hyperlipidemia   . Hypertension    pt. denies  . Iron deficiency anemia   . Long term (current) use of anticoagulants [Z79.01] 07/20/2016  . Low back pain 06/12/2009   Qualifier: Diagnosis of  By: [name]   . Nephrolithiasis   .  Orthostasis   . OSA (obstructive sleep apnea)   . PAF (paroxysmal atrial fibrillation) ([place])    a. dx 2017, started on amiodarone/warfarin but patient intermittently noncompliant.  Marland Kitchen PSVT (paroxysmal supraventricular tachycardia) ([place])   . RBBB 07/09/2009   Qualifier: Diagnosis of  By: [name], MD, [name] [name]   . RLS (restless legs syndrome) 09/19/2009   Qualifier: Diagnosis of  By: [name]   Overview:  July 2014: Controlled with Mirapex  Last Assessment & Plan:  Patient is doing well. Will continue current management and follow clinically.  . Seizure disorder ([place]) 01/09/2017  . Spondylosis    C5-6, C6-7 MRI 2010  . Syncope 03/2016  . Thyroid disease   . Tubulovillous adenoma of colon 2007  . Vitamin D deficiency 05/17/2017  . Wears glasses     Past Surgical History:  Procedure Laterality Date  . ANTERIOR CERVICAL DECOMP/DISCECTOMY FUSION N/A 05/31/2016   Procedure: ANTERIOR CERVICAL DECOMPRESSION/DISCECTOMY FUSION CERVICAL FIVE-SIX,CERVICAL SIX-SEVEN;  Surgeon: [name], MD;  Location: [facility];  Service: Neurosurgery;  Laterality: N/A;  . CARDIAC CATHETERIZATION N/A 05/30/2015   Procedure: Left Heart Cath and Coronary Angiography;  Surgeon: [name], MD;  Location: [facility];  Service: Cardiovascular;  Laterality: N/A;  . COLONOSCOPY    . CORONARY ARTERY BYPASS GRAFT     2000  . ESOPHAGOGASTRODUODENOSCOPY    . LEFT HEART CATH AND CORS/GRAFTS ANGIOGRAPHY N/A 12/15/2017   Procedure: LEFT HEART CATH AND CORS/GRAFTS ANGIOGRAPHY;  Surgeon: [name], MD;  Location: [facility];  Service: Cardiovascular;  Laterality: N/A;  . LEFT HEART CATHETERIZATION WITH CORONARY/GRAFT ANGIOGRAM N/A 08/01/2012   Procedure: LEFT HEART CATHETERIZATION WITH Beatrix Fetters;  Surgeon: [name], MD;  Location: [facility];  Service: Cardiovascular;  Laterality: N/A;  . LEFT HEART CATHETERIZATION WITH CORONARY/GRAFT ANGIOGRAM N/A 01/03/2015    Procedure: LEFT HEART CATHETERIZATION WITH Beatrix Fetters;  Surgeon: [name], MD;  Location: [facility] CATH LAB;  Service: Cardiovascular;  Laterality: N/A;  . NASAL SEPTUM SURGERY     UP3  . PERCUTANEOUS CORONARY STENT INTERVENTION (PCI-S)  10/2008   mRCA PCI  2.75 x 23 Abbott Xience V drug-eluting stent   . RIGHT/LEFT HEART CATH AND CORONARY/GRAFT ANGIOGRAPHY N/A 01/07/2020   Procedure: RIGHT/LEFT HEART CATH AND CORONARY/GRAFT ANGIOGRAPHY;  Surgeon: [name], MD;  Location: [place];  Service: Cardiovascular;  Laterality: N/A;  . ULTRASOUND GUIDANCE FOR VASCULAR ACCESS  12/15/2017   Procedure: Ultrasound Guidance For Vascular Access;  Surgeon: [name], MD;  Location: [facility];  Service: Cardiovascular;;    Family History  Problem Relation Age of Onset  . Pancreatic cancer Brother   . Diabetes Brother   . Coronary artery disease Brother   . Stroke Brother   . Diabetes  Brother   . Diabetes Mother   . Heart failure Mother   . Heart failure Father   . Hypothyroidism Brother   . Coronary artery disease Brother   . Other Brother        colon surgery  . Heart attack Other        Nephew  . Irregular heart beat Daughter   . Cancer Maternal Grandmother        unknown   . Colon cancer Neg Hx   . Stomach cancer Neg Hx   . Liver cancer Neg Hx   . Rectal cancer Neg Hx   . Esophageal cancer Neg Hx     Social History   Socioeconomic History  . Marital status: Married    Spouse name: Not on file  . Number of children: 4  . Years of education: 39  . Highest education level: Not on file  Occupational History  . Occupation: retired  Tobacco Use  . Smoking status: Never Smoker  . Smokeless tobacco: Never Used  Substance and Sexual Activity  . Alcohol use: No    Alcohol/week: 0.0 standard drinks    Comment: stopped drinking in 1998  . Drug use: No  . Sexual activity: Not on file  Other Topics Concern  . Not on file  Social History  Narrative   Patient is right handed.   Patient does not drink caffeine.   Social Determinants of Health   Financial Resource Strain:   . Difficulty of Paying Living Expenses:   Food Insecurity:   . Worried About Charity fundraiser in the Last Year:   . Arboriculturist in the Last Year:   Transportation Needs:   . Film/video editor (Medical):   Marland Kitchen Lack of Transportation (Non-Medical):   Physical Activity:   . Days of Exercise per Week:   . Minutes of Exercise per Session:   Stress:   . Feeling of Stress :   Social Connections:   . Frequency of Communication with Friends and Family:   . Frequency of Social Gatherings with Friends and Family:   . Attends Religious Services:   . Active Member of Clubs or Organizations:   . Attends Archivist Meetings:   Marland Kitchen Marital Status:   Intimate Partner Violence:   . Fear of Current or Ex-Partner:   . Emotionally Abused:   Marland Kitchen Physically Abused:   . Sexually Abused:     Outpatient Medications Prior to Visit  Medication Sig Dispense Refill  . albuterol (VENTOLIN HFA) 108 (90 Base) MCG/ACT inhaler Inhale 2 puffs into the lungs every 6 (six) hours as needed for wheezing or shortness of breath. 18 g 2  . amLODipine (NORVASC) 5 MG tablet Take 1 tablet (5 mg total) by mouth daily. 90 tablet 1  . atorvastatin (LIPITOR) 80 MG tablet TAKE 1 TABLET (80 MG TOTAL) BY MOUTH DAILY. 90 tablet 3  . Continuous Blood Gluc Sensor (FREESTYLE LIBRE 14 DAY SENSOR) MISC 1 Device by Does not apply route every 14 (fourteen) days. 6 each 3  . divalproex (DEPAKOTE ER) 250 MG 24 hr tablet Take 3 tablets (750 mg total) by mouth at bedtime. 180 tablet 0  . ELIQUIS 5 MG TABS tablet TAKE 1 TABLET (5 MG TOTAL) BY MOUTH 2 (TWO) TIMES DAILY. (Patient taking differently: Take 5 mg by mouth 2 (two) times daily. ) 60 tablet 5  . fenofibrate (TRICOR) 145 MG tablet TAKE 1 TABLET BY MOUTH ONCE DAILY **DISCONTINUE 130MG **  30 tablet 3  . ferrous sulfate 325 (65 FE) MG  tablet Take 1 tablet (325 mg total) by mouth every other day.  3  . furosemide (LASIX) 20 MG tablet TAKE 1 TABLET BY MOUTH DAILY AS NEEDED FOR EDEMA 30 tablet 3  . glucose blood (ACCU-CHEK AVIVA) test strip Use as directed to check blood sugar 4 times daily.  DX E11.9 200 each 6  . insulin aspart (NOVOLOG FLEXPEN) 100 UNIT/ML FlexPen 3 times a day (just before each meal) 65-70-130 units 30 pen 11  . insulin NPH Human (NOVOLIN N) 100 UNIT/ML injection Inject 0.8 mLs (80 Units total) into the skin at bedtime. 30 mL 11  . Insulin Pen Needle 31G X 8 MM MISC 1 each by Does not apply route 3 (three) times daily. E11.9 60 each 0  . INSULIN SYRINGE 1CC/29G 29G X 1/2" 1 ML MISC 1 each by Does not apply route daily. Use to inject insulin daily 30 each 0  . isosorbide mononitrate (IMDUR) 60 MG 24 hr tablet Take 1 tablet (60 mg total) by mouth daily. 90 tablet 3  . levothyroxine (SYNTHROID) 100 MCG tablet TAKE 1 TABLET (100 MCG TOTAL) BY MOUTH DAILY. 90 tablet 1  . metoprolol succinate (TOPROL-XL) 50 MG 24 hr tablet TAKE 1 AND 1/2 TABLETS EVERY DAY FOR A TOTAL OF 75 MG 135 tablet 3  . nitroGLYCERIN (NITROSTAT) 0.4 MG SL tablet PLACE 1 TABLET UNDER THE TONGUE EVERY 5 MINUTES AS NEEDED FOR CHEST PAIN. (Patient taking differently: Place 0.4 mg under the tongue every 5 (five) minutes as needed for chest pain. ) 25 tablet 1  . pantoprazole (PROTONIX) 40 MG tablet Take 1 tablet (40 mg total) by mouth daily. 90 tablet 0  . pramipexole (MIRAPEX) 1 MG tablet TAKE 1/2 TO 1 TABLET AT BEDTIME 90 tablet 0  . ranolazine (RANEXA) 500 MG 12 hr tablet TAKE 1 TABLET BY MOUTH TWICE DAILY 60 tablet 5  . rOPINIRole (REQUIP) 1 MG tablet TAKE 1 TABLET BY MOUTH AT BEDTIME 30 tablet 1  . sertraline (ZOLOFT) 100 MG tablet TAKE 1 TABLET (100 MG TOTAL) BY MOUTH 2 (TWO) TIMES DAILY. 180 tablet 1  . tiZANidine (ZANAFLEX) 4 MG tablet TAKE 1 TABLET (4 MG TOTAL) BY MOUTH EVERY 6 (SIX) HOURS AS NEEDED FOR MUSCLE SPASMS. 40 tablet 2  . Vitamin  D, Ergocalciferol, (DRISDOL) 1.25 MG (50000 UT) CAPS capsule Take 1 capsule (50,000 Units total) by mouth every 7 (seven) days. 20 capsule 0   No facility-administered medications prior to visit.    Allergies  Allergen Reactions  . Morphine Other (See Comments)    hallucinations  . Shellfish-Derived Products Itching    Review of Systems  Constitutional: Negative for fever and malaise/fatigue.  HENT: Negative for congestion.   Eyes: Negative for blurred vision.  Respiratory: Negative for shortness of breath.   Cardiovascular: Negative for chest pain, palpitations and leg swelling.  Gastrointestinal: Negative for abdominal pain, blood in stool and nausea.  Genitourinary: Negative for dysuria and frequency.  Musculoskeletal: Positive for back pain, joint pain and myalgias. Negative for falls.  Skin: Negative for rash.  Neurological: Positive for headaches. Negative for dizziness and loss of consciousness.  Endo/Heme/Allergies: Negative for environmental allergies.  Psychiatric/Behavioral: Negative for depression. The patient is not nervous/anxious.        Objective:    Physical Exam unable to obtain via phone  Wt 209 lb (94.8 kg)   BMI 30.86 kg/m  Wt Readings from Last 3  Encounters:  05/09/20 209 lb (94.8 kg)  04/04/20 210 lb (95.3 kg)  03/18/20 218 lb 12.8 oz (99.2 kg)    Diabetic Foot Exam - Simple   No data filed     Lab Results  Component Value Date   WBC 6.6 01/08/2020   HGB 12.7 (L) 01/08/2020   HCT 36.3 (L) 01/08/2020   PLT 197 01/08/2020   GLUCOSE 223 (H) 01/22/2020   CHOL 123 12/27/2019   TRIG 117.0 12/27/2019   HDL 34.50 (L) 12/27/2019   LDLDIRECT 77.0 08/20/2019   LDLCALC 65 12/27/2019   ALT 31 01/05/2020   AST 30 01/05/2020   NA 140 01/22/2020   K 4.5 01/22/2020   CL 105 01/22/2020   CREATININE 1.75 (H) 01/22/2020   BUN 18 01/22/2020   CO2 20 01/22/2020   TSH 4.38 12/27/2019   PSA 3.14 08/20/2019   INR 2.7 04/23/2019   HGBA1C 7.4 (A)  11/07/2019   MICROALBUR <0.7 05/14/2019    Lab Results  Component Value Date   TSH 4.38 12/27/2019   Lab Results  Component Value Date   WBC 6.6 01/08/2020   HGB 12.7 (L) 01/08/2020   HCT 36.3 (L) 01/08/2020   MCV 90.3 01/08/2020   PLT 197 01/08/2020   Lab Results  Component Value Date   NA 140 01/22/2020   K 4.5 01/22/2020   CO2 20 01/22/2020   GLUCOSE 223 (H) 01/22/2020   BUN 18 01/22/2020   CREATININE 1.75 (H) 01/22/2020   BILITOT 1.1 01/05/2020   ALKPHOS 48 01/05/2020   AST 30 01/05/2020   ALT 31 01/05/2020   PROT 7.1 01/05/2020   ALBUMIN 4.1 01/05/2020   CALCIUM 9.4 01/22/2020   ANIONGAP 8 01/08/2020   GFR 34.26 (L) 12/27/2019   Lab Results  Component Value Date   CHOL 123 12/27/2019   Lab Results  Component Value Date   HDL 34.50 (L) 12/27/2019   Lab Results  Component Value Date   LDLCALC 65 12/27/2019   Lab Results  Component Value Date   TRIG 117.0 12/27/2019   Lab Results  Component Value Date   CHOLHDL 4 12/27/2019   Lab Results  Component Value Date   HGBA1C 7.4 (A) 11/07/2019       Assessment & Plan:   Problem List Items Addressed This Visit    Diabetes mellitus type II, uncontrolled ([place]) - Primary (Chronic)    hgba1c acceptable, minimize simple carbs. Increase exercise as tolerated. Continue current meds, repeat labs ordered. He missed an appointment with endocrinology due to hospitalization of his wife. He agrees to follow up and make an appointment      Relevant Orders   Hemoglobin A1c   CBC   Comprehensive metabolic panel   TSH   Low back pain potentially associated with radiculopathy    After reviewing xray will proceed with MR      Relevant Orders   MR Lumbar Spine Wo Contrast   Hyperlipidemia LDL goal <70   Relevant Orders   CBC   TSH   Lipid panel   Essential hypertension    Monitor and report any concerns, no changes to meds. Encouraged heart healthy diet such as the DASH diet and exercise as tolerated.         HLD (hyperlipidemia)    Tolerating statin, encouraged heart healthy diet, avoid trans fats, minimize simple carbs and saturated fats. Increase exercise as tolerated      Headache    He has been seen by neurology  and he reports they have agreed he has a component of post concussion headache, he agrees to follow up with them if headaches worsens      Vitamin D deficiency   Relevant Orders   VITAMIN D 25 Hydroxy (Vit-D Deficiency, Fractures)   Hypersomnolence   Relevant Orders   CBC   TSH   CAD (coronary artery disease)   Relevant Orders   CBC   TSH   CKD (chronic kidney disease), stage IV ([place])    Hydrate and monitor      Thoracic back pain    After reviewing xray will proceed with MR      Relevant Orders   MR Thoracic Spine Wo Contrast      I am having [name] [name] maintain his divalproex, Vitamin D (Ergocalciferol), glucose blood, ferrous sulfate, albuterol, INSULIN SYRINGE 1CC/29G, FreeStyle Libre 14 Day Sensor, insulin NPH Human, Insulin Pen Needle, Eliquis, nitroGLYCERIN, levothyroxine, amLODipine, fenofibrate, pantoprazole, pramipexole, NovoLOG FlexPen, tiZANidine, metoprolol succinate, ranolazine, atorvastatin, isosorbide mononitrate, sertraline, rOPINIRole, and furosemide.  No orders of the defined types were placed in this encounter.    I discussed the assessment and treatment plan with the patient. The patient was provided an opportunity to ask questions and all were answered. The patient agreed with the plan and demonstrated an understanding of the instructions.   The patient was advised to call back or seek an in-person evaluation if the symptoms worsen or if the condition fails to improve as anticipated.  I provided 25 minutes of non-face-to-face time during this encounter.   Penni Homans, MD

## 2020-05-12 ENCOUNTER — Telehealth: Payer: Self-pay

## 2020-05-13 ENCOUNTER — Other Ambulatory Visit: Payer: Medicare PPO

## 2020-05-14 ENCOUNTER — Other Ambulatory Visit: Payer: Self-pay

## 2020-05-14 ENCOUNTER — Other Ambulatory Visit (INDEPENDENT_AMBULATORY_CARE_PROVIDER_SITE_OTHER): Payer: Medicare PPO

## 2020-05-14 DIAGNOSIS — I25111 Atherosclerotic heart disease of native coronary artery with angina pectoris with documented spasm: Secondary | ICD-10-CM

## 2020-05-14 DIAGNOSIS — G4710 Hypersomnia, unspecified: Secondary | ICD-10-CM

## 2020-05-14 DIAGNOSIS — E785 Hyperlipidemia, unspecified: Secondary | ICD-10-CM

## 2020-05-14 DIAGNOSIS — E559 Vitamin D deficiency, unspecified: Secondary | ICD-10-CM | POA: Diagnosis not present

## 2020-05-14 DIAGNOSIS — E1165 Type 2 diabetes mellitus with hyperglycemia: Secondary | ICD-10-CM | POA: Diagnosis not present

## 2020-05-14 LAB — LIPID PANEL
Cholesterol: 113 mg/dL (ref 0–200)
HDL: 33.2 mg/dL — ABNORMAL LOW (ref >39.00)
LDL Cholesterol: 61 mg/dL (ref 0–99)
NonHDL: 79.65
Total CHOL/HDL Ratio: 3
Triglycerides: 92 mg/dL (ref 0.0–149.0)
VLDL: 18.4 mg/dL (ref 0.0–40.0)

## 2020-05-14 LAB — VITAMIN D 25 HYDROXY (VIT D DEFICIENCY, FRACTURES): VITD: 21.32 ng/mL — ABNORMAL LOW (ref 30.00–100.00)

## 2020-05-14 LAB — COMPREHENSIVE METABOLIC PANEL
ALT: 26 U/L (ref 0–53)
AST: 22 U/L (ref 0–37)
Albumin: 4.5 g/dL (ref 3.5–5.2)
Alkaline Phosphatase: 71 U/L (ref 39–117)
BUN: 21 mg/dL (ref 6–23)
CO2: 27 mEq/L (ref 19–32)
Calcium: 9.7 mg/dL (ref 8.4–10.5)
Chloride: 106 mEq/L (ref 96–112)
Creatinine, Ser: 1.73 mg/dL — ABNORMAL HIGH (ref 0.40–1.50)
GFR: 39.06 mL/min — ABNORMAL LOW (ref >60.00)
Glucose, Bld: 53 mg/dL — ABNORMAL LOW (ref 70–99)
Potassium: 4.2 mEq/L (ref 3.5–5.1)
Sodium: 141 mEq/L (ref 135–145)
Total Bilirubin: 0.5 mg/dL (ref 0.2–1.2)
Total Protein: 7 g/dL (ref 6.0–8.3)

## 2020-05-14 LAB — CBC
HCT: 38.3 % — ABNORMAL LOW (ref 39.0–52.0)
Hemoglobin: 13.2 g/dL (ref 13.0–17.0)
MCHC: 34.4 g/dL (ref 30.0–36.0)
MCV: 90.3 fl (ref 78.0–100.0)
Platelets: 202 10*3/uL (ref 150.0–400.0)
RBC: 4.25 Mil/uL (ref 4.22–5.81)
RDW: 14 % (ref 11.5–15.5)
WBC: 8 10*3/uL (ref 4.0–10.5)

## 2020-05-14 LAB — HEMOGLOBIN A1C: Hgb A1c MFr Bld: 8.5 % — ABNORMAL HIGH (ref 4.6–6.5)

## 2020-05-14 LAB — TSH: TSH: 3.04 u[IU]/mL (ref 0.35–4.50)

## 2020-05-15 ENCOUNTER — Other Ambulatory Visit: Payer: Medicare PPO

## 2020-05-17 ENCOUNTER — Encounter (HOSPITAL_BASED_OUTPATIENT_CLINIC_OR_DEPARTMENT_OTHER): Payer: Self-pay

## 2020-05-17 ENCOUNTER — Ambulatory Visit (HOSPITAL_BASED_OUTPATIENT_CLINIC_OR_DEPARTMENT_OTHER)
Admission: RE | Admit: 2020-05-17 | Discharge: 2020-05-17 | Disposition: A | Discharge status: Home / Self Care | Payer: Medicare PPO | Source: Ambulatory Visit | Attending: Family Medicine | Admitting: Family Medicine

## 2020-05-17 ENCOUNTER — Other Ambulatory Visit: Payer: Self-pay

## 2020-05-17 ENCOUNTER — Encounter: Payer: Self-pay | Admitting: Endocrinology

## 2020-05-17 DIAGNOSIS — M546 Pain in thoracic spine: Secondary | ICD-10-CM

## 2020-05-17 DIAGNOSIS — M545 Low back pain, unspecified: Secondary | ICD-10-CM

## 2020-05-20 ENCOUNTER — Telehealth: Payer: Self-pay | Admitting: Family Medicine

## 2020-05-20 ENCOUNTER — Other Ambulatory Visit: Payer: Self-pay | Admitting: *Deleted

## 2020-05-20 MED ORDER — VITAMIN D (ERGOCALCIFEROL) 1.25 MG (50000 UNIT) PO CAPS: 50000.0000 [IU] | ORAL_CAPSULE | ORAL | 4 refills | Status: DC

## 2020-05-20 MED ORDER — FENOFIBRATE 145 MG PO TABS: ORAL_TABLET | ORAL | 1 refills | Status: DC

## 2020-05-20 MED ORDER — NITROGLYCERIN 0.4 MG SL SUBL: SUBLINGUAL_TABLET | SUBLINGUAL | 1 refills | Status: DC

## 2020-05-20 MED FILL — FENOFIBRATE 145 MG TABS: 145 | 90 days supply | Qty: 90 | Fill #0

## 2020-05-20 MED FILL — VIT D2 1.25 MG (50,000 UNIT: 1.25 MG | 28 days supply | Qty: 4 | Fill #0

## 2020-05-20 MED FILL — NITROGLYCERIN 0.4 MG TAB SL: 0.4 | 5 days supply | Qty: 25 | Fill #0

## 2020-05-20 NOTE — Telephone Encounter (Signed)
rxs refilled and patient notified. Also reviewed labs and sent in vit d.

## 2020-05-20 NOTE — Telephone Encounter (Signed)
Pt came in office stating is needing refill on Fenofibrate (tab) 145 mg and Nitroglycerin (SL tab) 0.4 mg sent to [facility]. Pt wanted to know if possible to send ASAP since pt is in the building today. Please advise.

## 2020-05-23 ENCOUNTER — Telehealth: Payer: Self-pay

## 2020-05-23 ENCOUNTER — Other Ambulatory Visit: Payer: Self-pay | Admitting: Interventional Cardiology

## 2020-05-23 MED FILL — RANOLAZINE ER 500 MG TABLET: 500 | 30 days supply | Qty: 60 | Fill #3

## 2020-05-23 MED FILL — ELIQUIS 5 MG TABLET: 5 | 30 days supply | Qty: 60 | Fill #0

## 2020-05-23 NOTE — Telephone Encounter (Signed)
Patient called in to speak with the nurse about his vitamin D  Medication. Please call the patient back at [phone]

## 2020-05-23 NOTE — Telephone Encounter (Signed)
Prescription refill request for Eliquis received.  Last office visit: [name] [name], 03/18/2020 Scr: 1.73, 05/14/2020 Age: 74 y.o. Weight: 94.8 kg   Prescription refill sent.

## 2020-05-26 ENCOUNTER — Emergency Department (HOSPITAL_BASED_OUTPATIENT_CLINIC_OR_DEPARTMENT_OTHER)
Admission: EM | Admit: 2020-05-26 | Discharge: 2020-05-26 | Disposition: A | Discharge status: Home / Self Care | Payer: Medicare PPO | Attending: Emergency Medicine | Admitting: Emergency Medicine

## 2020-05-26 ENCOUNTER — Emergency Department (HOSPITAL_BASED_OUTPATIENT_CLINIC_OR_DEPARTMENT_OTHER): Payer: Medicare PPO

## 2020-05-26 ENCOUNTER — Other Ambulatory Visit: Payer: Self-pay

## 2020-05-26 ENCOUNTER — Encounter (HOSPITAL_BASED_OUTPATIENT_CLINIC_OR_DEPARTMENT_OTHER): Payer: Self-pay | Admitting: *Deleted

## 2020-05-26 DIAGNOSIS — I5032 Chronic diastolic (congestive) heart failure: Secondary | ICD-10-CM | POA: Insufficient documentation

## 2020-05-26 DIAGNOSIS — Z9861 Coronary angioplasty status: Secondary | ICD-10-CM | POA: Diagnosis not present

## 2020-05-26 DIAGNOSIS — E1122 Type 2 diabetes mellitus with diabetic chronic kidney disease: Secondary | ICD-10-CM | POA: Diagnosis not present

## 2020-05-26 DIAGNOSIS — Z794 Long term (current) use of insulin: Secondary | ICD-10-CM | POA: Diagnosis not present

## 2020-05-26 DIAGNOSIS — N184 Chronic kidney disease, stage 4 (severe): Secondary | ICD-10-CM | POA: Insufficient documentation

## 2020-05-26 DIAGNOSIS — I2510 Atherosclerotic heart disease of native coronary artery without angina pectoris: Secondary | ICD-10-CM | POA: Insufficient documentation

## 2020-05-26 DIAGNOSIS — S93602A Unspecified sprain of left foot, initial encounter: Secondary | ICD-10-CM | POA: Diagnosis not present

## 2020-05-26 DIAGNOSIS — Y9364 Activity, baseball: Secondary | ICD-10-CM | POA: Insufficient documentation

## 2020-05-26 DIAGNOSIS — S99922A Unspecified injury of left foot, initial encounter: Secondary | ICD-10-CM | POA: Diagnosis present

## 2020-05-26 DIAGNOSIS — W1841XA Slipping, tripping and stumbling without falling due to stepping on object, initial encounter: Secondary | ICD-10-CM | POA: Diagnosis not present

## 2020-05-26 DIAGNOSIS — I130 Hypertensive heart and chronic kidney disease with heart failure and stage 1 through stage 4 chronic kidney disease, or unspecified chronic kidney disease: Secondary | ICD-10-CM | POA: Diagnosis not present

## 2020-05-26 DIAGNOSIS — Y9289 Other specified places as the place of occurrence of the external cause: Secondary | ICD-10-CM | POA: Insufficient documentation

## 2020-05-26 DIAGNOSIS — Z951 Presence of aortocoronary bypass graft: Secondary | ICD-10-CM | POA: Diagnosis not present

## 2020-05-26 DIAGNOSIS — S93402A Sprain of unspecified ligament of left ankle, initial encounter: Secondary | ICD-10-CM | POA: Diagnosis not present

## 2020-05-26 DIAGNOSIS — S99912A Unspecified injury of left ankle, initial encounter: Secondary | ICD-10-CM | POA: Diagnosis not present

## 2020-05-26 DIAGNOSIS — Z79899 Other long term (current) drug therapy: Secondary | ICD-10-CM | POA: Diagnosis not present

## 2020-05-26 DIAGNOSIS — Z7901 Long term (current) use of anticoagulants: Secondary | ICD-10-CM | POA: Insufficient documentation

## 2020-05-26 DIAGNOSIS — Y999 Unspecified external cause status: Secondary | ICD-10-CM | POA: Diagnosis not present

## 2020-05-26 NOTE — ED Notes (Signed)
Transported to xray 

## 2020-05-26 NOTE — ED Triage Notes (Signed)
Fell 2 and half weeks ago and injured his left ankle and is not getting any better.

## 2020-05-26 NOTE — ED Notes (Signed)
ED Provider at bedside. 

## 2020-05-26 NOTE — ED Provider Notes (Signed)
[name] [name]   CSN: [id number] Arrival date & time: 05/26/20  1809     History Chief Complaint  Patient presents with  . Fall    [name] [name] is a 74 y.o. male.  Patient with injury to his right ankle foot about 2-1/2 weeks ago.  Came down hard off a step.  Not sure whether his ankle overturned or not.  Patient has had several ankle sprains in the past.  Due to baseball.  Patient said a few days later started to swell did not swell initially.  Does hurt to walk on most of the pain is at the heel area.        Past Medical History:  Diagnosis Date  . Acquired atrophy of thyroid 06/05/2013   Overview:  July 2014: controlled on synthroid 28mcg since 2012 May 2015: decreased to Synthroid 50 mcg  Last Assessment & Plan:  Pt doing well with most recent TSH within normal range.  Will continue current dosage of Synthroid 3mcg. Pt reminded to take medication on empty stomach. Will continue to monitor with periodic laboratory assessment in 3 months.   . Anemia    hemoglobin 7.4, iron deficiency, January, 2011, 2 unit transfusion, endoscopy normal, capsule endoscopy February, 2011 no small bowel abnormalities.   Most likely source gastric erosions, followed by GI  . Anxiety   . Bradycardia   . CAD (coronary artery disease)    A. CABG in 2000,status post cardiac cath in 2006, 2009 ....continued chest pain and SOB despite oral medication adjestments including Ranexa. B. Cath November 2009/ mRCA - 2.75 x 23 Abbott Xience V drug-eluting stent ...11/26/2008 to distal  RCA leading to acute marginal.  C. Cath 07/2012 for CP - stable anatomy, med rx. d. cath 2015 and 05/30/2015 stable anatomy, consider Myoview if has CP again  . Carotid artery disease ([place]) 08/01/2015   Doppler, May 29, 2015, 1-39% bilateral ICA   . Cerebral ischemia    MRI November, 2010, chronic microvascular ischemia  . CKD (chronic kidney disease), stage III   . Concussion   .  Depression    Bipolar  . Diabetes mellitus ([place])   . Edema   . Essential hypertension 06/05/2013   Overview:  July 2014: Controlled with Bystolic Sep 2458: Changed to atenolol.  Last Assessment & Plan:  Will change to atenolol for cost and follow Improved.  Medication compliance strongly encouraged BP: 116/64 mmHg    Overview:  July 2014: Controlled with Bystolic Sep 0998: Changed to atenolol.  Last Assessment & Plan:  Will change to atenolol for cost and follow  . Falling episodes    these have occurred in the past and again recurring 2011  . Family history of adverse reaction to anesthesia    "mother died during bypass surgery but not sure if it has to do with anesthesia"  . Gastric ulcer   . GERD (gastroesophageal reflux disease)   . H/O medication noncompliance    Due to loss of insurance  . H/O multiple concussions   . Hard of hearing   . History of blood transfusion 12/20/2013  . Hx of CABG    2000,  / one median sternotomy suture broken her chest x-ray November, 2010, no clinical significance  . Hyperlipidemia   . Hypertension    pt. denies  . Iron deficiency anemia   . Long term (current) use of anticoagulants [Z79.01] 07/20/2016  . Low back pain 06/12/2009   Qualifier: Diagnosis  of  By: [name]   . Nephrolithiasis   . Orthostasis   . OSA (obstructive sleep apnea)   . PAF (paroxysmal atrial fibrillation) ([place])    a. dx 2017, started on amiodarone/warfarin but patient intermittently noncompliant.  Marland Kitchen PSVT (paroxysmal supraventricular tachycardia) ([place])   . RBBB 07/09/2009   Qualifier: Diagnosis of  By: [name], MD, [name] [name]   . RLS (restless legs syndrome) 09/19/2009   Qualifier: Diagnosis of  By: [name]   Overview:  July 2014: Controlled with Mirapex  Last Assessment & Plan:  Patient is doing well. Will continue current management and follow clinically.  . Seizure disorder ([place]) 01/09/2017  . Spondylosis    C5-6, C6-7 MRI 2010  . Syncope 03/2016  .  Thyroid disease   . Tubulovillous adenoma of colon 2007  . Vitamin D deficiency 05/17/2017  . Wears glasses     Patient Active Problem List   Diagnosis Date Noted  . Thoracic back pain 05/11/2020  . CKD (chronic kidney disease), stage IV ([place])   . Unstable angina ([place]) 01/05/2020  . Left ankle pain 12/30/2019  . Frequency of urination 05/13/2019  . Melena 04/09/2019  . Obesity 02/05/2019  . Neck pain 02/05/2019  . MCI (mild cognitive impairment) 02/05/2019  . Fall at home, initial encounter 11/06/2018  . Localized swelling of both lower extremities 07/13/2018  . Cervical radiculopathy at C8 02/08/2018  . Hypersomnolence 10/18/2017  . Vitamin D deficiency 05/17/2017  . Seizure disorder ([place]) 01/09/2017  . Atrial fibrillation ([place]) [I48.91] 07/20/2016  . Long term (current) use of anticoagulants [Z79.01] 07/20/2016  . Foraminal stenosis of cervical region 05/31/2016  . DOE (dyspnea on exertion) 05/14/2016  . Chronic diastolic CHF (congestive heart failure) ([place]) 03/13/2016  . Headache 01/25/2016  . Carotid artery disease ([place]) 08/01/2015  . Spinal stenosis in cervical region 07/30/2015  . Occipital neuralgia of right side 07/15/2015  . Cervicogenic headache 07/15/2015  . Dizziness   . Chest pain 05/29/2015  . Medicare annual wellness visit, initial 05/11/2015  . Sun-damaged skin 05/11/2015  . Advance care planning 05/01/2015  . Post-traumatic headache 02/18/2015  . Pain in both knees 01/07/2015  . Recurrent pain of right knee 05/10/2014  . Personal history of ongoing treatment with alendronate (Fosamax) 12/20/2013  . History of blood transfusion 12/20/2013  . Hyperlipidemia LDL goal <70 06/27/2013  . HLD (hyperlipidemia) 06/27/2013  . Essential hypertension 06/05/2013  . Arthritis of hand, degenerative 06/05/2013  . Acquired atrophy of thyroid 06/05/2013  . Hypothyroidism due to acquired atrophy of thyroid 06/05/2013  . Osteoarthritis of hand 06/05/2013  .  Arteriosclerosis of coronary artery 05/01/2013  . Benign prostatic hyperplasia with urinary obstruction 05/01/2013  . BPH with obstruction/lower urinary tract symptoms 05/01/2013  . CAD (coronary artery disease) 05/01/2013  . Abdominal pain, unspecified site 03/06/2013  . Right shoulder pain 10/24/2012  . Eczema 10/24/2012  . Bilateral groin pain 09/21/2012  . Ejection fraction   . Diabetes mellitus type II, uncontrolled ([place]) 08/31/2011  . Coronary artery disease involving native coronary artery   . Anemia   . OSA (obstructive sleep apnea)   . Edema   . Falling episodes   . Hx of CABG   . Palpitations   . Shortness of breath   . Insomnia 04/26/2011  . FATIGUE 12/10/2010  . RLS (restless legs syndrome) 09/19/2009  . Depression 07/09/2009  . RBBB 07/09/2009  . Low back pain potentially associated with radiculopathy 06/12/2009  .  SCIATICA 01/17/2009  . GERD (gastroesophageal reflux disease) 01/09/2009  . TUBULOVILLOUS ADENOMA, COLON, HX OF 01/08/2009  . Presence of drug coated stent in right coronary artery 10/29/2008  . NEPHROLITHIASIS, HX OF 12/20/2007    Past Surgical History:  Procedure Laterality Date  . ANTERIOR CERVICAL DECOMP/DISCECTOMY FUSION N/A 05/31/2016   Procedure: ANTERIOR CERVICAL DECOMPRESSION/DISCECTOMY FUSION CERVICAL FIVE-SIX,CERVICAL SIX-SEVEN;  Surgeon: [name], MD;  Location: [facility];  Service: Neurosurgery;  Laterality: N/A;  . CARDIAC CATHETERIZATION N/A 05/30/2015   Procedure: Left Heart Cath and Coronary Angiography;  Surgeon: [name], MD;  Location: [facility];  Service: Cardiovascular;  Laterality: N/A;  . COLONOSCOPY    . CORONARY ARTERY BYPASS GRAFT     2000  . ESOPHAGOGASTRODUODENOSCOPY    . LEFT HEART CATH AND CORS/GRAFTS ANGIOGRAPHY N/A 12/15/2017   Procedure: LEFT HEART CATH AND CORS/GRAFTS ANGIOGRAPHY;  Surgeon: [name], MD;  Location: [facility];  Service: Cardiovascular;  Laterality: N/A;  . LEFT  HEART CATHETERIZATION WITH CORONARY/GRAFT ANGIOGRAM N/A 08/01/2012   Procedure: LEFT HEART CATHETERIZATION WITH Beatrix Fetters;  Surgeon: [name], MD;  Location: [facility] C[facility] LAB;  Service: Cardiovascular;  Laterality: N/A;  . LEFT HEART CATHETERIZATION WITH CORONARY/GRAFT ANGIOGRAM N/A 01/03/2015   Procedure: LEFT HEART CATHETERIZATION WITH Beatrix Fetters;  Surgeon: [name], MD;  Location: [facility] CATH LAB;  Service: Cardiovascular;  Laterality: N/A;  . NASAL SEPTUM SURGERY     UP3  . PERCUTANEOUS CORONARY STENT INTERVENTION (PCI-S)  10/2008   mRCA PCI  2.75 x 23 Abbott Xience V drug-eluting stent   . RIGHT/LEFT HEART CATH AND CORONARY/GRAFT ANGIOGRAPHY N/A 01/07/2020   Procedure: RIGHT/LEFT HEART CATH AND CORONARY/GRAFT ANGIOGRAPHY;  Surgeon: [name], MD;  Location: [facility] CV LAB;  Service: Cardiovascular;  Laterality: N/A;  . ULTRASOUND GUIDANCE FOR VASCULAR ACCESS  12/15/2017   Procedure: Ultrasound Guidance For Vascular Access;  Surgeon: [name], MD;  Location: [facility] LAB;  Service: Cardiovascular;;       Family History  Problem Relation Age of Onset  . Pancreatic cancer Brother   . Diabetes Brother   . Coronary artery disease Brother   . Stroke Brother   . Diabetes Brother   . Diabetes Mother   . Heart failure Mother   . Heart failure Father   . Hypothyroidism Brother   . Coronary artery disease Brother   . Other Brother        colon surgery  . Heart attack Other        Nephew  . Irregular heart beat Daughter   . Cancer Maternal Grandmother        unknown   . Colon cancer Neg Hx   . Stomach cancer Neg Hx   . Liver cancer Neg Hx   . Rectal cancer Neg Hx   . Esophageal cancer Neg Hx     Social History   Tobacco Use  . Smoking status: Never Smoker  . Smokeless tobacco: Never Used  Vaping Use  . Vaping Use: Never used  Substance Use Topics  . Alcohol use: No    Alcohol/week: 0.0 standard drinks    Comment:  stopped drinking in 1998  . Drug use: No    Home Medications Prior to Admission medications   Medication Sig Start Date End Date Taking? Authorizing Provider  amLODipine (NORVASC) 5 MG tablet Take 1 tablet (5 mg total) by mouth daily. 01/09/20  Yes Cheryln Manly, NP  atorvastatin (LIPITOR) 80 MG tablet TAKE 1 TABLET (80 MG TOTAL) BY MOUTH DAILY. 03/11/20  Yes [name], MD  divalproex (DEPAKOTE ER) 250 MG 24 hr tablet Take 3 tablets (750 mg total) by mouth at bedtime. 11/07/18  Yes Ghimire, Shanker M, MD  ELIQUIS 5 MG TABS tablet TAKE 1 TABLET (5 MG TOTAL) BY MOUTH 2 (TWO) TIMES DAILY. 05/23/20  Yes [name], MD  fenofibrate (TRICOR) 145 MG tablet TAKE 1 TABLET BY MOUTH ONCE DAILY 05/20/20  Yes [name], MD  furosemide (LASIX) 20 MG tablet TAKE 1 TABLET BY MOUTH DAILY AS NEEDED FOR EDEMA 05/07/20  Yes [name], MD  insulin aspart (NOVOLOG FLEXPEN) 100 UNIT/ML FlexPen 3 times a day (just before each meal) 65-70-130 units 01/25/20  Yes Renato Shin, MD  insulin NPH Human (NOVOLIN N) 100 UNIT/ML injection Inject 0.8 mLs (80 Units total) into the skin at bedtime. 09/06/19  Yes Renato Shin, MD  isosorbide mononitrate (IMDUR) 60 MG 24 hr tablet Take 1 tablet (60 mg total) by mouth daily. 03/18/20  Yes [name], MD  levothyroxine (SYNTHROID) 100 MCG tablet TAKE 1 TABLET (100 MCG TOTAL) BY MOUTH DAILY. 12/14/19  Yes [name], MD  metoprolol succinate (TOPROL-XL) 50 MG 24 hr tablet TAKE 1 AND 1/2 TABLETS EVERY DAY FOR A TOTAL OF 75 MG 02/08/20  Yes [name], MD  pantoprazole (PROTONIX) 40 MG tablet Take 1 tablet (40 mg total) by mouth daily. 01/22/20  Yes [name], MD  pramipexole (MIRAPEX) 1 MG tablet TAKE 1/2 TO 1 TABLET AT BEDTIME 01/22/20  Yes [name], MD  ranolazine (RANEXA) 500 MG 12 hr tablet TAKE 1 TABLET BY MOUTH TWICE DAILY 02/27/20  Yes [name], MD  rOPINIRole (REQUIP) 1 MG tablet TAKE 1 TABLET BY MOUTH AT BEDTIME  04/29/20  Yes [name], MD  sertraline (ZOLOFT) 100 MG tablet TAKE 1 TABLET (100 MG TOTAL) BY MOUTH 2 (TWO) TIMES DAILY. 04/29/20  Yes [name], MD  Vitamin D, Ergocalciferol, (DRISDOL) 1.25 MG (50000 UNIT) CAPS capsule Take 1 capsule (50,000 Units total) by mouth every 7 (seven) days. 05/20/20  Yes [name], MD  albuterol (VENTOLIN HFA) 108 (90 Base) MCG/ACT inhaler Inhale 2 puffs into the lungs every 6 (six) hours as needed for wheezing or shortness of breath. 06/25/19   [name], MD  Continuous Blood Gluc Sensor (FREESTYLE LIBRE 14 DAY SENSOR) MISC 1 Device by Does not apply route every 14 (fourteen) days. 09/06/19   Renato Shin, MD  glucose blood (ACCU-CHEK AVIVA) test strip Use as directed to check blood sugar 4 times daily.  DX E11.9 02/05/19   [name], MD  Insulin Pen Needle 31G X 8 MM MISC 1 each by Does not apply route 3 (three) times daily. E11.9 11/12/19   Renato Shin, MD  INSULIN SYRINGE 1CC/29G 29G X 1/2" 1 ML MISC 1 each by Does not apply route daily. Use to inject insulin daily 08/28/19   Renato Shin, MD  nitroGLYCERIN (NITROSTAT) 0.4 MG SL tablet PLACE 1 TABLET UNDER THE TONGUE EVERY 5 MINUTES AS NEEDED FOR CHEST PAIN. 05/20/20   [name], MD  tiZANidine (ZANAFLEX) 4 MG tablet TAKE 1 TABLET (4 MG TOTAL) BY MOUTH EVERY 6 (SIX) HOURS AS NEEDED FOR MUSCLE SPASMS. 01/31/20   [name], MD    Allergies    Morphine and Shellfish-derived products  Review of Systems  Review of Systems  Constitutional: Negative for chills and fever.  HENT: Negative for congestion, rhinorrhea and sore throat.   Eyes: Negative for visual disturbance.  Respiratory: Negative for cough and shortness of breath.   Cardiovascular: Negative for chest pain and leg swelling.  Gastrointestinal: Negative for abdominal pain, diarrhea, nausea and vomiting.  Genitourinary: Negative for dysuria.  Musculoskeletal: Positive for joint swelling. Negative for back pain and neck  pain.  Skin: Negative for rash.  Neurological: Negative for dizziness, light-headedness and headaches.  Hematological: Does not bruise/bleed easily.  Psychiatric/Behavioral: Negative for confusion.    Physical Exam Updated Vital Signs BP 136/61 (BP Location: Right Arm)   Pulse 76   Temp 98.1 F (36.7 C) (Oral)   Resp 16   Ht 1.778 m (5\' 10" )   Wt 95.7 kg   SpO2 98%   BMI 30.28 kg/m   Physical Exam Vitals and nursing [name] reviewed.  Constitutional:      Appearance: Normal appearance. He is well-developed.  HENT:     Head: Normocephalic and atraumatic.  Eyes:     Extraocular Movements: Extraocular movements intact.     Conjunctiva/sclera: Conjunctivae normal.     Pupils: Pupils are equal, round, and reactive to light.  Cardiovascular:     Rate and Rhythm: Normal rate and regular rhythm.     Heart sounds: No murmur heard.   Pulmonary:     Effort: Pulmonary effort is normal. No respiratory distress.     Breath sounds: Normal breath sounds.  Abdominal:     Palpations: Abdomen is soft.     Tenderness: There is no abdominal tenderness.  Musculoskeletal:        General: Normal range of motion.     Cervical back: Normal range of motion and neck supple.     Comments: Sensation intact to the left foot.  May be some swelling at the heel area.  No tenderness to palpation at the ankle.  Particularly none laterally.  Dorsalis pedis pulses 2+.  Good cap refill to the toes.  No proximal leg tenderness.  No swelling or tenderness to the knee.  In addition no tenderness to palpation of the Achilles tendon on the left side.  Skin:    General: Skin is warm and dry.     Capillary Refill: Capillary refill takes less than 2 seconds.  Neurological:     General: No focal deficit present.     Mental Status: He is alert. Mental status is at baseline. He is disoriented.     Cranial Nerves: No cranial nerve deficit.     Sensory: No sensory deficit.     Motor: No weakness.     ED Results /  Procedures / Treatments   Labs (all labs ordered are listed, but only abnormal results are displayed) Labs Reviewed - No data to display  EKG None  Radiology DG Ankle Complete Left  Result Date: 05/26/2020 CLINICAL DATA:  74 year old male with fall and trauma to the left foot. EXAM: LEFT ANKLE COMPLETE - 3+ VIEW; LEFT FOOT - COMPLETE 3+ VIEW COMPARISON:  None. FINDINGS: Small bone fragment inferior to the lateral malleolus may be chronic. An acute avulsion fracture is not excluded. Clinical correlation is recommended. No other acute fracture identified. The bones are mildly osteopenic. Mild degenerative changes of the tarsal bone with spurring. The ankle mortise is intact. Mild subcutaneous edema. IMPRESSION: Probable chronic fragment of the lateral malleolus. An acute fracture is less likely. Clinical correlation is recommended. Electronically Signed  By: Anner Crete [name]   On: 05/26/2020 19:58   DG Foot Complete Left  Result Date: 05/26/2020 CLINICAL DATA:  74 year old male with fall and trauma to the left foot. EXAM: LEFT ANKLE COMPLETE - 3+ VIEW; LEFT FOOT - COMPLETE 3+ VIEW COMPARISON:  None. FINDINGS: Small bone fragment inferior to the lateral malleolus may be chronic. An acute avulsion fracture is not excluded. Clinical correlation is recommended. No other acute fracture identified. The bones are mildly osteopenic. Mild degenerative changes of the tarsal bone with spurring. The ankle mortise is intact. Mild subcutaneous edema. IMPRESSION: Probable chronic fragment of the lateral malleolus. An acute fracture is less likely. Clinical correlation is recommended. Electronically Signed   By: Anner Crete [name]   On: 05/26/2020 19:58    Procedures Procedures (including critical care time)  Medications Ordered in ED Medications - No data to display  ED Course  I have reviewed the triage vital signs and the nursing notes.  Pertinent labs & imaging results that were available  during my care of the patient were reviewed by me and considered in my medical decision making (see chart for details).    MDM Rules/Calculators/A&P                          Patient's x-rays show evidence of probably an old lateral malleolus avulsion injury.  Patient has no swelling or tenderness in that area.  Clinically no distinct Achilles tendon injury.  But is a possibility.  Certainly the Achilles tendon is still currently intact.  Will place in a cam walker have him follow-up with sports medicine.    Final Clinical Impression(s) / ED Diagnoses Final diagnoses:  Foot sprain, left, initial encounter  Sprain of left ankle, unspecified ligament, initial encounter    Rx / DC Orders ED Discharge Orders    None       Fredia Sorrow, MD 05/26/20 2053

## 2020-05-26 NOTE — Discharge Instructions (Addendum)
CAM Walker for your comfort.  Make an appointment to follow-up with sports medicine.  X-rays only show left outer aspect or lateral aspect of the ankle probably an old injury.  Do not think is related to this current one.  Return for any new or worse symptoms.

## 2020-05-26 NOTE — ED Notes (Signed)
Returned from xray

## 2020-05-26 NOTE — Telephone Encounter (Signed)
Patient just needed dose of otc and how much he suppose to take.  Advised that he is to 2000IU daily.

## 2020-05-30 MED FILL — NovoLIN N 100 UNIT/ML SUSP: 100 | 17 days supply | Qty: 10 | Fill #2

## 2020-05-30 MED FILL — NOVOLOG FLEXPEN SYRINGE: 100 | 7 days supply | Qty: 15 | Fill #12

## 2020-06-06 ENCOUNTER — Other Ambulatory Visit: Payer: Self-pay

## 2020-06-06 ENCOUNTER — Ambulatory Visit: Payer: Self-pay

## 2020-06-06 ENCOUNTER — Ambulatory Visit: Payer: Medicare PPO | Admitting: Family Medicine

## 2020-06-06 ENCOUNTER — Encounter: Payer: Self-pay | Admitting: Family Medicine

## 2020-06-06 VITALS — BP 151/79 | HR 73 | Ht 69.0 in | Wt 211.0 lb

## 2020-06-06 DIAGNOSIS — M7662 Achilles tendinitis, left leg: Secondary | ICD-10-CM

## 2020-06-06 DIAGNOSIS — M7660 Achilles tendinitis, unspecified leg: Secondary | ICD-10-CM | POA: Insufficient documentation

## 2020-06-06 NOTE — Patient Instructions (Signed)
Nice to meet you Please try ice  Please use the heel lifts  Please try small range of motion movements of the ankle.   Please send me a message in MyChart with any questions or updates.  Please see me back in 3 weeks.   --Dr. Raeford Razor

## 2020-06-06 NOTE — Assessment & Plan Note (Signed)
Acute thickening observed on exam.  Likely a component of tendinosis with the amount of pain he has on exam.  No appreciated tearing of the Achilles -Counseled on home exercise therapy and supportive care. -Provided heel lifts. -Continue cam walker. -Follow-up in 3 weeks.  Would consider advancing therapy at that point.

## 2020-06-06 NOTE — Progress Notes (Signed)
[name] [name] - 75 y.o. male MRN [id number]  Date of birth: 1946/11/22  SUBJECTIVE:  Including CC & ROS.  Chief Complaint  Patient presents with  . Ankle Injury    left x 2-3 weeks    [name] [name] is a 74 y.o. male that is presenting with left Achilles pain.  He was out walking his dog 1 night and felt a sudden pop when he had a step forward.  He is having pain and swelling in the Achilles midportion.  He was seen in the emergency department and write a cam walker.  His swelling is worse at the end of the day..  Independent review of the left foot and ankle x-ray from 6/21 shows likely an old avulsion fracture of the lateral malleolus.   Review of Systems See HPI   HISTORY: Past Medical, Surgical, Social, and Family History Reviewed & Updated per EMR.   Pertinent Historical Findings include:  Past Medical History:  Diagnosis Date  . Acquired atrophy of thyroid 06/05/2013   Overview:  July 2014: controlled on synthroid 73mcg since 2012 May 2015: decreased to Synthroid 50 mcg  Last Assessment & Plan:  Pt doing well with most recent TSH within normal range.  Will continue current dosage of Synthroid 41mcg. Pt reminded to take medication on empty stomach. Will continue to monitor with periodic laboratory assessment in 3 months.   . Anemia    hemoglobin 7.4, iron deficiency, January, 2011, 2 unit transfusion, endoscopy normal, capsule endoscopy February, 2011 no small bowel abnormalities.   Most likely source gastric erosions, followed by GI  . Anxiety   . Bradycardia   . CAD (coronary artery disease)    A. CABG in 2000,status post cardiac cath in 2006, 2009 ....continued chest pain and SOB despite oral medication adjestments including Ranexa. B. Cath November 2009/ mRCA - 2.75 x 23 Abbott Xience V drug-eluting stent ...11/26/2008 to distal  RCA leading to acute marginal.  C. Cath 07/2012 for CP - stable anatomy, med rx. d. cath 2015 and 05/30/2015 stable anatomy, consider Myoview if has  CP again  . Carotid artery disease ([place]) 08/01/2015   Doppler, May 29, 2015, 1-39% bilateral ICA   . Cerebral ischemia    MRI November, 2010, chronic microvascular ischemia  . CKD (chronic kidney disease), stage III   . Concussion   . Depression    Bipolar  . Diabetes mellitus ([place])   . Edema   . Essential hypertension 06/05/2013   Overview:  July 2014: Controlled with Bystolic Sep 0539: Changed to atenolol.  Last Assessment & Plan:  Will change to atenolol for cost and follow Improved.  Medication compliance strongly encouraged BP: 116/64 mmHg    Overview:  July 2014: Controlled with Bystolic Sep 7673: Changed to atenolol.  Last Assessment & Plan:  Will change to atenolol for cost and follow  . Falling episodes    these have occurred in the past and again recurring 2011  . Family history of adverse reaction to anesthesia    "mother died during bypass surgery but not sure if it has to do with anesthesia"  . Gastric ulcer   . GERD (gastroesophageal reflux disease)   . H/O medication noncompliance    Due to loss of insurance  . H/O multiple concussions   . Hard of hearing   . History of blood transfusion 12/20/2013  . Hx of CABG    2000,  / one median sternotomy suture broken her chest x-ray November, 2010,  no clinical significance  . Hyperlipidemia   . Hypertension    pt. denies  . Iron deficiency anemia   . Long term (current) use of anticoagulants [Z79.01] 07/20/2016  . Low back pain 06/12/2009   Qualifier: Diagnosis of  By: [name]   . Nephrolithiasis   . Orthostasis   . OSA (obstructive sleep apnea)   . PAF (paroxysmal atrial fibrillation) ([place])    a. dx 2017, started on amiodarone/warfarin but patient intermittently noncompliant.  Marland Kitchen PSVT (paroxysmal supraventricular tachycardia) ([place])   . RBBB 07/09/2009   Qualifier: Diagnosis of  By: [name], MD, [name] [name]   . RLS (restless legs syndrome) 09/19/2009   Qualifier: Diagnosis of  By: [name]   Overview:   July 2014: Controlled with Mirapex  Last Assessment & Plan:  Patient is doing well. Will continue current management and follow clinically.  . Seizure disorder ([place]) 01/09/2017  . Spondylosis    C5-6, C6-7 MRI 2010  . Syncope 03/2016  . Thyroid disease   . Tubulovillous adenoma of colon 2007  . Vitamin D deficiency 05/17/2017  . Wears glasses     Past Surgical History:  Procedure Laterality Date  . ANTERIOR CERVICAL DECOMP/DISCECTOMY FUSION N/A 05/31/2016   Procedure: ANTERIOR CERVICAL DECOMPRESSION/DISCECTOMY FUSION CERVICAL FIVE-SIX,CERVICAL SIX-SEVEN;  Surgeon: [name], MD;  Location: [place] [facility];  Service: Neurosurgery;  Laterality: N/A;  . CARDIAC CATHETERIZATION N/A 05/30/2015   Procedure: Left Heart Cath and Coronary Angiography;  Surgeon: [name], MD;  Location: [place] [facility];  Service: Cardiovascular;  Laterality: N/A;  . COLONOSCOPY    . CORONARY ARTERY BYPASS GRAFT     2000  . ESOPHAGOGASTRODUODENOSCOPY    . LEFT HEART CATH AND CORS/GRAFTS ANGIOGRAPHY N/A 12/15/2017   Procedure: LEFT HEART CATH AND CORS/GRAFTS ANGIOGRAPHY;  Surgeon: [name], MD;  Location: [place] [facility];  Service: Cardiovascular;  Laterality: N/A;  . LEFT HEART CATHETERIZATION WITH CORONARY/GRAFT ANGIOGRAM N/A 08/01/2012   Procedure: LEFT HEART CATHETERIZATION WITH Beatrix Fetters;  Surgeon: [name], MD;  Location: [facility] CATH LAB;  Service: Cardiovascular;  Laterality: N/A;  . LEFT HEART CATHETERIZATION WITH CORONARY/GRAFT ANGIOGRAM N/A 01/03/2015   Procedure: LEFT HEART CATHETERIZATION WITH Beatrix Fetters;  Surgeon: [name], MD;  Location: [facility] CATH LAB;  Service: Cardiovascular;  Laterality: N/A;  . NASAL SEPTUM SURGERY     UP3  . PERCUTANEOUS CORONARY STENT INTERVENTION (PCI-S)  10/2008   mRCA PCI  2.75 x 23 Abbott Xience V drug-eluting stent   . RIGHT/LEFT HEART CATH AND CORONARY/GRAFT ANGIOGRAPHY N/A 01/07/2020   Procedure: RIGHT/LEFT HEART CATH  AND CORONARY/GRAFT ANGIOGRAPHY;  Surgeon: [name], MD;  Location: [place] CV LAB;  Service: Cardiovascular;  Laterality: N/A;  . ULTRASOUND GUIDANCE FOR VASCULAR ACCESS  12/15/2017   Procedure: Ultrasound Guidance For Vascular Access;  Surgeon: [name], MD;  Location: [place] CV LAB;  Service: Cardiovascular;;    Family History  Problem Relation Age of Onset  . Pancreatic cancer Brother   . Diabetes Brother   . Coronary artery disease Brother   . Stroke Brother   . Diabetes Brother   . Diabetes Mother   . Heart failure Mother   . Heart failure Father   . Hypothyroidism Brother   . Coronary artery disease Brother   . Other Brother        colon surgery  . Heart attack Other  Nephew  . Irregular heart beat Daughter   . Cancer Maternal Grandmother        unknown   . Colon cancer Neg Hx   . Stomach cancer Neg Hx   . Liver cancer Neg Hx   . Rectal cancer Neg Hx   . Esophageal cancer Neg Hx     Social History   Socioeconomic History  . Marital status: Married    Spouse name: Not on file  . Number of children: 4  . Years of education: 45  . Highest education level: Not on file  Occupational History  . Occupation: retired  Tobacco Use  . Smoking status: Never Smoker  . Smokeless tobacco: Never Used  Vaping Use  . Vaping Use: Never used  Substance and Sexual Activity  . Alcohol use: No    Alcohol/week: 0.0 standard drinks    Comment: stopped drinking in 1998  . Drug use: No  . Sexual activity: Not on file  Other Topics Concern  . Not on file  Social History Narrative   Patient is right handed.   Patient does not drink caffeine.   Social Determinants of Health   Financial Resource Strain:   . Difficulty of Paying Living Expenses:   Food Insecurity:   . Worried About Charity fundraiser in the Last Year:   . Arboriculturist in the Last Year:   Transportation Needs:   . Film/video editor (Medical):   Marland Kitchen Lack of Transportation  (Non-Medical):   Physical Activity:   . Days of Exercise per Week:   . Minutes of Exercise per Session:   Stress:   . Feeling of Stress :   Social Connections:   . Frequency of Communication with Friends and Family:   . Frequency of Social Gatherings with Friends and Family:   . Attends Religious Services:   . Active Member of Clubs or Organizations:   . Attends Archivist Meetings:   Marland Kitchen Marital Status:   Intimate Partner Violence:   . Fear of Current or Ex-Partner:   . Emotionally Abused:   Marland Kitchen Physically Abused:   . Sexually Abused:      PHYSICAL EXAM:  VS: BP (!) 151/79   Pulse 73   Ht 5\' 9"  (1.753 m)   Wt 211 lb (95.7 kg)   BMI 31.16 kg/m  Physical Exam Gen: NAD, alert, cooperative with exam, well-appearing MSK:  Left Achilles: Obvious swelling and thickening of the midportion. Normal range of motion of the ankle. Plantarflexion with [place] Silos test. Neurovascularly intact  Limited ultrasound: Left Achilles:  Normal appearance at the insertion.  Significant thickening at the midportion to around 1.6 cm.  Increased hyperemia deep.  No tear appreciated of the Achilles.  Summary: Acute changes of the Achilles with no specific tearing appreciated.  Ultrasound and interpretation by Clearance Coots, MD    ASSESSMENT & PLAN:   Achilles tendon pain Acute thickening observed on exam.  Likely a component of tendinosis with the amount of pain he has on exam.  No appreciated tearing of the Achilles -Counseled on home exercise therapy and supportive care. -Provided heel lifts. -Continue cam walker. -Follow-up in 3 weeks.  Would consider advancing therapy at that point.

## 2020-06-08 ENCOUNTER — Other Ambulatory Visit: Payer: Self-pay | Admitting: Family Medicine

## 2020-06-10 ENCOUNTER — Telehealth: Payer: Self-pay | Admitting: Family Medicine

## 2020-06-10 MED FILL — rOPINIRole HCL 1 MG TABS: 1 | 30 days supply | Qty: 30 | Fill #1

## 2020-06-10 MED FILL — NOVOLOG FLEXPEN SYRINGE: 100 | 7 days supply | Qty: 15 | Fill #13

## 2020-06-10 MED FILL — VIT D2 1.25 MG (50,000 UNIT: 1.25 MG | 28 days supply | Qty: 4 | Fill #1

## 2020-06-10 MED FILL — ATORVASTATIN 80 MG TABLET: 80 | 90 days supply | Qty: 90 | Fill #1

## 2020-06-10 MED FILL — FUROSEMIDE 20 MG TAB: 20 | 30 days supply | Qty: 30 | Fill #1

## 2020-06-12 MED FILL — NovoLIN N 100 UNIT/ML SUSP: 100 | 17 days supply | Qty: 10 | Fill #3

## 2020-06-19 ENCOUNTER — Other Ambulatory Visit: Payer: Self-pay | Admitting: Family Medicine

## 2020-06-19 MED FILL — ELIQUIS 5 MG TABLET: 5 | 30 days supply | Qty: 60 | Fill #1

## 2020-06-19 MED FILL — RANOLAZINE ER 500 MG TABLET: 500 | 30 days supply | Qty: 60 | Fill #4

## 2020-06-20 ENCOUNTER — Other Ambulatory Visit: Payer: Self-pay | Admitting: Family Medicine

## 2020-06-20 MED FILL — LEVOTHYROXINE SODIUM 100 MC: 100 | 90 days supply | Qty: 90 | Fill #0

## 2020-06-20 MED FILL — NOVOLOG FLEXPEN SYRINGE: 100 | 7 days supply | Qty: 15 | Fill #14

## 2020-06-23 ENCOUNTER — Ambulatory Visit (INDEPENDENT_AMBULATORY_CARE_PROVIDER_SITE_OTHER): Payer: Medicare PPO | Admitting: Endocrinology

## 2020-06-23 ENCOUNTER — Other Ambulatory Visit: Payer: Self-pay | Admitting: Endocrinology

## 2020-06-23 ENCOUNTER — Other Ambulatory Visit: Payer: Self-pay

## 2020-06-23 DIAGNOSIS — E1165 Type 2 diabetes mellitus with hyperglycemia: Secondary | ICD-10-CM | POA: Diagnosis not present

## 2020-06-23 MED ORDER — NOVOLOG FLEXPEN 100 UNIT/ML ~~LOC~~ SOPN: PEN_INJECTOR | SUBCUTANEOUS | 11 refills | Status: DC

## 2020-06-23 MED ORDER — FREESTYLE LIBRE 14 DAY READER DEVI: 0 refills | Status: DC

## 2020-06-23 MED ORDER — TRULICITY 0.75 MG/0.5ML ~~LOC~~ SOAJ: 0.7500 mg | SUBCUTANEOUS | 11 refills | Status: DC

## 2020-06-23 MED FILL — FREESTYLE LIBRE 14 DAY READ: 1 days supply | Qty: 1 | Fill #0

## 2020-06-23 MED FILL — TRULICITY 0.75 MG/0.5 ML PE: 0.75 | 28 days supply | Qty: 2 | Fill #0

## 2020-06-23 NOTE — Patient Instructions (Addendum)
Your blood pressure is high today.  Please see your primary care provider soon, to have it rechecked Please reduce the supper Novolog to 110 units, and:  Please continue the same other insulin shots, and: I have sent a prescription to your pharmacy, to add "Trulicity." Please come back for a follow-up appointment in 2 months.

## 2020-06-23 NOTE — Progress Notes (Signed)
Subjective:    Patient ID: [name] [name], male    DOB: 08-28-1946, 74 y.o.   MRN: [id number]  HPI Pt returns for f/u of diabetes mellitus:  DM type: Insulin-requiring type 2.  Dx'ed: 2703 Complications: PN, renal insuff, CAD and PAD.  Therapy: insulin since 2008 DKA: never Severe hypoglycemia: never Pancreatitis: never Pancreatic imaging: 2017 CT showed fatty replacement of the pancreatic head and body.  SDOH: he takes human NPH insulin at HS, due to cost.   Other: he takes multiple daily injections.   Interval history: he is not using continuous glucose monitor.  no cbg record, but states cbg varies from 45-290.  He says he never misses the insulin. He has mild hypoglycemia approx twice per week.  This happens at HS.   Past Medical History:  Diagnosis Date  . Acquired atrophy of thyroid 06/05/2013   Overview:  July 2014: controlled on synthroid 18mcg since 2012 May 2015: decreased to Synthroid 50 mcg  Last Assessment & Plan:  Pt doing well with most recent TSH within normal range.  Will continue current dosage of Synthroid 37mcg. Pt reminded to take medication on empty stomach. Will continue to monitor with periodic laboratory assessment in 3 months.   . Anemia    hemoglobin 7.4, iron deficiency, January, 2011, 2 unit transfusion, endoscopy normal, capsule endoscopy February, 2011 no small bowel abnormalities.   Most likely source gastric erosions, followed by GI  . Anxiety   . Bradycardia   . CAD (coronary artery disease)    A. CABG in 2000,status post cardiac cath in 2006, 2009 ....continued chest pain and SOB despite oral medication adjestments including Ranexa. B. Cath November 2009/ mRCA - 2.75 x 23 Abbott Xience V drug-eluting stent ...11/26/2008 to distal  RCA leading to acute marginal.  C. Cath 07/2012 for CP - stable anatomy, med rx. d. cath 2015 and 05/30/2015 stable anatomy, consider Myoview if has CP again  . Carotid artery disease ([place]) 08/01/2015   Doppler, May 29, 2015,  1-39% bilateral ICA   . Cerebral ischemia    MRI November, 2010, chronic microvascular ischemia  . CKD (chronic kidney disease), stage III   . Concussion   . Depression    Bipolar  . Diabetes mellitus ([place])   . Edema   . Essential hypertension 06/05/2013   Overview:  July 2014: Controlled with Bystolic Sep 5009: Changed to atenolol.  Last Assessment & Plan:  Will change to atenolol for cost and follow Improved.  Medication compliance strongly encouraged BP: 116/64 mmHg    Overview:  July 2014: Controlled with Bystolic Sep 3818: Changed to atenolol.  Last Assessment & Plan:  Will change to atenolol for cost and follow  . Falling episodes    these have occurred in the past and again recurring 2011  . Family history of adverse reaction to anesthesia    "mother died during bypass surgery but not sure if it has to do with anesthesia"  . Gastric ulcer   . GERD (gastroesophageal reflux disease)   . H/O medication noncompliance    Due to loss of insurance  . H/O multiple concussions   . Hard of hearing   . History of blood transfusion 12/20/2013  . Hx of CABG    2000,  / one median sternotomy suture broken her chest x-ray November, 2010, no clinical significance  . Hyperlipidemia   . Hypertension    pt. denies  . Iron deficiency anemia   . Long term (current) use  of anticoagulants [Z79.01] 07/20/2016  . Low back pain 06/12/2009   Qualifier: Diagnosis of  By: [name]   . Nephrolithiasis   . Orthostasis   . OSA (obstructive sleep apnea)   . PAF (paroxysmal atrial fibrillation) ([place])    a. dx 2017, started on amiodarone/warfarin but patient intermittently noncompliant.  Marland Kitchen PSVT (paroxysmal supraventricular tachycardia) ([place])   . RBBB 07/09/2009   Qualifier: Diagnosis of  By: [name], MD, [name] [name]   . RLS (restless legs syndrome) 09/19/2009   Qualifier: Diagnosis of  By: [name]   Overview:  July 2014: Controlled with Mirapex  Last Assessment & Plan:  Patient is doing  well. Will continue current management and follow clinically.  . Seizure disorder ([place]) 01/09/2017  . Spondylosis    C5-6, C6-7 MRI 2010  . Syncope 03/2016  . Thyroid disease   . Tubulovillous adenoma of colon 2007  . Vitamin D deficiency 05/17/2017  . Wears glasses     Past Surgical History:  Procedure Laterality Date  . ANTERIOR CERVICAL DECOMP/DISCECTOMY FUSION N/A 05/31/2016   Procedure: ANTERIOR CERVICAL DECOMPRESSION/DISCECTOMY FUSION CERVICAL FIVE-SIX,CERVICAL SIX-SEVEN;  Surgeon: [name], MD;  Location: [place] NEUR[facility] ORS;  Service: Neurosurgery;  Laterality: N/A;  . CARDIAC CATHETERIZATION N/A 05/30/2015   Procedure: Left Heart Cath and Coronary Angiography;  Surgeon: [name], MD;  Location: [place] CV LAB;  Service: Cardiovascular;  Laterality: N/A;  . COLONOSCOPY    . CORONARY ARTERY BYPASS GRAFT     2000  . ESOPHAGOGASTRODUODENOSCOPY    . LEFT HEART CATH AND CORS/GRAFTS ANGIOGRAPHY N/A 12/15/2017   Procedure: LEFT HEART CATH AND CORS/GRAFTS ANGIOGRAPHY;  Surgeon: [name], MD;  Location: [place] CV LAB;  Service: Cardiovascular;  Laterality: N/A;  . LEFT HEART CATHETERIZATION WITH CORONARY/GRAFT ANGIOGRAM N/A 08/01/2012   Procedure: LEFT HEART CATHETERIZATION WITH Beatrix Fetters;  Surgeon: [name], MD;  Location: [facility] CATH LAB;  Service: Cardiovascular;  Laterality: N/A;  . LEFT HEART CATHETERIZATION WITH CORONARY/GRAFT ANGIOGRAM N/A 01/03/2015   Procedure: LEFT HEART CATHETERIZATION WITH Beatrix Fetters;  Surgeon: [name], MD;  Location: [facility] CATH LAB;  Service: Cardiovascular;  Laterality: N/A;  . NASAL SEPTUM SURGERY     UP3  . PERCUTANEOUS CORONARY STENT INTERVENTION (PCI-S)  10/2008   mRCA PCI  2.75 x 23 Abbott Xience V drug-eluting stent   . RIGHT/LEFT HEART CATH AND CORONARY/GRAFT ANGIOGRAPHY N/A 01/07/2020   Procedure: RIGHT/LEFT HEART CATH AND CORONARY/GRAFT ANGIOGRAPHY;  Surgeon: [name], MD;  Location: [place] CV LAB;  Service: Cardiovascular;  Laterality: N/A;  . ULTRASOUND GUIDANCE FOR VASCULAR ACCESS  12/15/2017   Procedure: Ultrasound Guidance For Vascular Access;  Surgeon: [name], MD;  Location: [place] CV LAB;  Service: Cardiovascular;;    Social History   Socioeconomic History  . Marital status: Married    Spouse name: Not on file  . Number of children: 4  . Years of education: 61  . Highest education level: Not on file  Occupational History  . Occupation: retired  Tobacco Use  . Smoking status: Never Smoker  . Smokeless tobacco: Never Used  Vaping Use  . Vaping Use: Never used  Substance and Sexual Activity  . Alcohol use: No    Alcohol/week: 0.0 standard drinks    Comment: stopped drinking in 1998  . Drug use: No  . Sexual activity: Not on file  Other Topics Concern  . Not on file  Social History Narrative   Patient is right handed.   Patient does not drink caffeine.   Social Determinants of Health   Financial Resource Strain:   . Difficulty of Paying Living Expenses:   Food Insecurity:   . Worried About Charity fundraiser in the Last Year:   . Arboriculturist in the Last Year:   Transportation Needs:   . Film/video editor (Medical):   Marland Kitchen Lack of Transportation (Non-Medical):   Physical Activity:   . Days of Exercise per Week:   . Minutes of Exercise per Session:   Stress:   . Feeling of Stress :   Social Connections:   . Frequency of Communication with Friends and Family:   . Frequency of Social Gatherings with Friends and Family:   . Attends Religious Services:   . Active Member of Clubs or Organizations:   . Attends Archivist Meetings:   Marland Kitchen Marital Status:   Intimate Partner Violence:   . Fear of Current or Ex-Partner:   . Emotionally Abused:   Marland Kitchen Physically Abused:   . Sexually Abused:     Current Outpatient Medications on File Prior to Visit  Medication Sig Dispense Refill  . albuterol (VENTOLIN HFA) 108  (90 Base) MCG/ACT inhaler Inhale 2 puffs into the lungs every 6 (six) hours as needed for wheezing or shortness of breath. 18 g 2  . amLODipine (NORVASC) 5 MG tablet Take 1 tablet (5 mg total) by mouth daily. 90 tablet 1  . atorvastatin (LIPITOR) 80 MG tablet TAKE 1 TABLET (80 MG TOTAL) BY MOUTH DAILY. 90 tablet 3  . Continuous Blood Gluc Sensor (FREESTYLE LIBRE 14 DAY SENSOR) MISC 1 Device by Does not apply route every 14 (fourteen) days. 6 each 3  . divalproex (DEPAKOTE ER) 250 MG 24 hr tablet Take 3 tablets (750 mg total) by mouth at bedtime. 180 tablet 0  . ELIQUIS 5 MG TABS tablet TAKE 1 TABLET (5 MG TOTAL) BY MOUTH 2 (TWO) TIMES DAILY. 60 tablet 5  . fenofibrate (TRICOR) 145 MG tablet TAKE 1 TABLET BY MOUTH ONCE DAILY 90 tablet 1  . furosemide (LASIX) 20 MG tablet TAKE 1 TABLET BY MOUTH DAILY AS NEEDED FOR EDEMA 30 tablet 3  . glucose blood (ACCU-CHEK AVIVA) test strip Use as directed to check blood sugar 4 times daily.  DX E11.9 200 each 6  . insulin NPH Human (NOVOLIN N) 100 UNIT/ML injection Inject 0.8 mLs (80 Units total) into the skin at bedtime. 30 mL 11  . Insulin Pen Needle 31G X 8 MM MISC 1 each by Does not apply route 3 (three) times daily. E11.9 60 each 0  . INSULIN SYRINGE 1CC/29G 29G X 1/2" 1 ML MISC 1 each by Does not apply route daily. Use to inject insulin daily 30 each 0  . isosorbide mononitrate (IMDUR) 60 MG 24 hr tablet Take 1 tablet (60 mg total) by mouth daily. 90 tablet 3  . levothyroxine (SYNTHROID) 100 MCG tablet TAKE 1 TABLET (100 MCG TOTAL) BY MOUTH DAILY. 90 tablet 1  . metoprolol succinate (TOPROL-XL) 50 MG 24 hr tablet TAKE 1 AND 1/2 TABLETS EVERY DAY FOR A TOTAL OF 75 MG 135 tablet 3  . nitroGLYCERIN (NITROSTAT) 0.4 MG SL tablet PLACE 1 TABLET UNDER THE TONGUE EVERY 5 MINUTES AS NEEDED FOR CHEST PAIN. 25 tablet 1  . pantoprazole (PROTONIX) 40 MG tablet Take 1 tablet (40 mg total) by mouth daily. 90 tablet 3  .  pramipexole (MIRAPEX) 1 MG tablet Take 0.5-1 tablets  (0.5-1 mg total) by mouth at bedtime. 90 tablet 0  . ranolazine (RANEXA) 500 MG 12 hr tablet TAKE 1 TABLET BY MOUTH TWICE DAILY 60 tablet 5  . rOPINIRole (REQUIP) 1 MG tablet TAKE 1 TABLET BY MOUTH AT BEDTIME 30 tablet 1  . sertraline (ZOLOFT) 100 MG tablet TAKE 1 TABLET (100 MG TOTAL) BY MOUTH 2 (TWO) TIMES DAILY. 180 tablet 1  . tiZANidine (ZANAFLEX) 4 MG tablet TAKE 1 TABLET (4 MG TOTAL) BY MOUTH EVERY 6 (SIX) HOURS AS NEEDED FOR MUSCLE SPASMS. 40 tablet 2  . Vitamin D, Ergocalciferol, (DRISDOL) 1.25 MG (50000 UNIT) CAPS capsule Take 1 capsule (50,000 Units total) by mouth every 7 (seven) days. 4 capsule 4   No current facility-administered medications on file prior to visit.    Allergies  Allergen Reactions  . Morphine Other (See Comments)    hallucinations  . Shellfish-Derived Products Itching    Family History  Problem Relation Age of Onset  . Pancreatic cancer Brother   . Diabetes Brother   . Coronary artery disease Brother   . Stroke Brother   . Diabetes Brother   . Diabetes Mother   . Heart failure Mother   . Heart failure Father   . Hypothyroidism Brother   . Coronary artery disease Brother   . Other Brother        colon surgery  . Heart attack Other        Nephew  . Irregular heart beat Daughter   . Cancer Maternal Grandmother        unknown   . Colon cancer Neg Hx   . Stomach cancer Neg Hx   . Liver cancer Neg Hx   . Rectal cancer Neg Hx   . Esophageal cancer Neg Hx     BP 136/70 (BP Location: Left Arm, Patient Position: Sitting)   Pulse 68   Ht 5' 9.02" (1.753 m)   Wt 218 lb 6.4 oz (99.1 kg)   SpO2 98%   BMI 32.24 kg/m    Review of Systems Denies LOC    Objective:   Physical Exam VITAL SIGNS:  See vs page GENERAL: no distress Pulses: right dorsalis pedis intact MSK: no deformity of the right foot CV: 1+ right leg edema.   Skin:  no ulcer on the right foot  normal color and temp on the right foot.  Old healed surgical scar (vein harvest), on  the right leg Neuro: sensation is intact to touch on the right foot.   EXT: Left leg and foot are in a brace.    Lab Results  Component Value Date   HGBA1C 8.5 (H) 05/14/2020   Lab Results  Component Value Date   CREATININE 1.73 (H) 05/14/2020   BUN 21 05/14/2020   NA 141 05/14/2020   K 4.2 05/14/2020   CL 106 05/14/2020   CO2 27 05/14/2020        Assessment & Plan:  Insulin-requiring type 2 DM, with PAD: worse Hypoglycemia, due to insulin: we'll favor GLP over insulin HTN: is noted today  Patient Instructions  Your blood pressure is high today.  Please see your primary care provider soon, to have it rechecked Please reduce the supper Novolog to 110 units, and:  Please continue the same other insulin shots, and: I have sent a prescription to your pharmacy, to add "Trulicity." Please come back for a follow-up appointment in 2 months.

## 2020-06-27 MED FILL — NovoLIN N 100 UNIT/ML SUSP: 100 | 17 days supply | Qty: 10 | Fill #4

## 2020-06-30 ENCOUNTER — Encounter: Payer: Self-pay | Admitting: Family Medicine

## 2020-06-30 ENCOUNTER — Other Ambulatory Visit: Payer: Self-pay

## 2020-06-30 ENCOUNTER — Ambulatory Visit (INDEPENDENT_AMBULATORY_CARE_PROVIDER_SITE_OTHER): Payer: Medicare PPO | Admitting: Family Medicine

## 2020-06-30 VITALS — BP 138/70 | HR 62 | Ht 69.0 in | Wt 212.0 lb

## 2020-06-30 DIAGNOSIS — M7660 Achilles tendinitis, unspecified leg: Secondary | ICD-10-CM

## 2020-06-30 NOTE — Assessment & Plan Note (Signed)
Some improvement with heel lifts in cam walker.  Does get pain and swelling from time to time. -Counseled on home exercise therapy and supportive care. -Counseled on heel lifts and transitioning out of the cam walker. -Referral to physical therapy. -Could consider injection.

## 2020-06-30 NOTE — Patient Instructions (Signed)
Good to see you  Please try to always have the heel lifts in  Please try the rub on medicine  Physical therapy will give you a call.  Please send me a message in MyChart with any questions or updates.  Please see me back in 4-6 weeks.   --Dr. Raeford Razor

## 2020-06-30 NOTE — Progress Notes (Signed)
[name] [name] - 74 y.o. male MRN [id number]  Date of birth: 1946-11-02  SUBJECTIVE:  Including CC & ROS.  Chief Complaint  Patient presents with  . Follow-up    left achilles    [name] [name] is a 74 y.o. male that is presenting with ongoing left Achilles pain.  He has been using the cam walker and heel lifts.  Pain is intermittent in nature.  He does notice swelling if he uses it too much.  Review of Systems See HPI   HISTORY: Past Medical, Surgical, Social, and Family History Reviewed & Updated per EMR.   Pertinent Historical Findings include:  Past Medical History:  Diagnosis Date  . Acquired atrophy of thyroid 06/05/2013   Overview:  July 2014: controlled on synthroid 57mcg since 2012 May 2015: decreased to Synthroid 50 mcg  Last Assessment & Plan:  Pt doing well with most recent TSH within normal range.  Will continue current dosage of Synthroid 63mcg. Pt reminded to take medication on empty stomach. Will continue to monitor with periodic laboratory assessment in 3 months.   . Anemia    hemoglobin 7.4, iron deficiency, January, 2011, 2 unit transfusion, endoscopy normal, capsule endoscopy February, 2011 no small bowel abnormalities.   Most likely source gastric erosions, followed by GI  . Anxiety   . Bradycardia   . CAD (coronary artery disease)    A. CABG in 2000,status post cardiac cath in 2006, 2009 ....continued chest pain and SOB despite oral medication adjestments including Ranexa. B. Cath November 2009/ mRCA - 2.75 x 23 Abbott Xience V drug-eluting stent ...11/26/2008 to distal  RCA leading to acute marginal.  C. Cath 07/2012 for CP - stable anatomy, med rx. d. cath 2015 and 05/30/2015 stable anatomy, consider Myoview if has CP again  . Carotid artery disease ([place]) 08/01/2015   Doppler, May 29, 2015, 1-39% bilateral ICA   . Cerebral ischemia    MRI November, 2010, chronic microvascular ischemia  . CKD (chronic kidney disease), stage III   . Concussion   . Depression      Bipolar  . Diabetes mellitus ([place])   . Edema   . Essential hypertension 06/05/2013   Overview:  July 2014: Controlled with Bystolic Sep 1324: Changed to atenolol.  Last Assessment & Plan:  Will change to atenolol for cost and follow Improved.  Medication compliance strongly encouraged BP: 116/64 mmHg    Overview:  July 2014: Controlled with Bystolic Sep 4010: Changed to atenolol.  Last Assessment & Plan:  Will change to atenolol for cost and follow  . Falling episodes    these have occurred in the past and again recurring 2011  . Family history of adverse reaction to anesthesia    "mother died during bypass surgery but not sure if it has to do with anesthesia"  . Gastric ulcer   . GERD (gastroesophageal reflux disease)   . H/O medication noncompliance    Due to loss of insurance  . H/O multiple concussions   . Hard of hearing   . History of blood transfusion 12/20/2013  . Hx of CABG    2000,  / one median sternotomy suture broken her chest x-ray November, 2010, no clinical significance  . Hyperlipidemia   . Hypertension    pt. denies  . Iron deficiency anemia   . Long term (current) use of anticoagulants [Z79.01] 07/20/2016  . Low back pain 06/12/2009   Qualifier: Diagnosis of  By: [name]   .  Nephrolithiasis   . Orthostasis   . OSA (obstructive sleep apnea)   . PAF (paroxysmal atrial fibrillation) ([place])    a. dx 2017, started on amiodarone/warfarin but patient intermittently noncompliant.  Marland Kitchen PSVT (paroxysmal supraventricular tachycardia) ([place])   . RBBB 07/09/2009   Qualifier: Diagnosis of  By: [name], MD, [name] [name]   . RLS (restless legs syndrome) 09/19/2009   Qualifier: Diagnosis of  By: [name]   Overview:  July 2014: Controlled with Mirapex  Last Assessment & Plan:  Patient is doing well. Will continue current management and follow clinically.  . Seizure disorder ([place]) 01/09/2017  . Spondylosis    C5-6, C6-7 MRI 2010  . Syncope 03/2016  . Thyroid disease    . Tubulovillous adenoma of colon 2007  . Vitamin D deficiency 05/17/2017  . Wears glasses     Past Surgical History:  Procedure Laterality Date  . ANTERIOR CERVICAL DECOMP/DISCECTOMY FUSION N/A 05/31/2016   Procedure: ANTERIOR CERVICAL DECOMPRESSION/DISCECTOMY FUSION CERVICAL FIVE-SIX,CERVICAL SIX-SEVEN;  Surgeon: [name], MD;  Location: [facility];  Service: Neurosurgery;  Laterality: N/A;  . CARDIAC CATHETERIZATION N/A 05/30/2015   Procedure: Left Heart Cath and Coronary Angiography;  Surgeon: [name], MD;  Location: [facility];  Service: Cardiovascular;  Laterality: N/A;  . COLONOSCOPY    . CORONARY ARTERY BYPASS GRAFT     2000  . ESOPHAGOGASTRODUODENOSCOPY    . LEFT HEART CATH AND CORS/GRAFTS ANGIOGRAPHY N/A 12/15/2017   Procedure: LEFT HEART CATH AND CORS/GRAFTS ANGIOGRAPHY;  Surgeon: [name], MD;  Location: [facility] LAB;  Service: Cardiovascular;  Laterality: N/A;  . LEFT HEART CATHETERIZATION WITH CORONARY/GRAFT ANGIOGRAM N/A 08/01/2012   Procedure: LEFT HEART CATHETERIZATION WITH Beatrix Fetters;  Surgeon: [name], MD;  Location[facility] C[facility] LAB;  Service: Cardiovascular;  Laterality: N/A;  . LEFT HEART CATHETERIZATION WITH CORONARY/GRAFT ANGIOGRAM N/A 01/03/2015   Procedure: LEFT HEART CATHETERIZATION WITH Beatrix Fetters;  Surgeon: [name], MD;  Location: [facility] CATH LAB;  Service: Cardiovascular;  Laterality: N/A;  . NASAL SEPTUM SURGERY     UP3  . PERCUTANEOUS CORONARY STENT INTERVENTION (PCI-S)  10/2008   mRCA PCI  2.75 x 23 Abbott Xience V drug-eluting stent   . RIGHT/LEFT HEART CATH AND CORONARY/GRAFT ANGIOGRAPHY N/A 01/07/2020   Procedure: RIGHT/LEFT HEART CATH AND CORONARY/GRAFT ANGIOGRAPHY;  Surgeon: [name], MD;  Location: [place] CV LAB;  Service: Cardiovascular;  Laterality: N/A;  . ULTRASOUND GUIDANCE FOR VASCULAR ACCESS  12/15/2017   Procedure: Ultrasound Guidance For Vascular Access;  Surgeon:  [name], MD;  Location: [facility] CV LAB;  Service: Cardiovascular;;    Family History  Problem Relation Age of Onset  . Pancreatic cancer Brother   . Diabetes Brother   . Coronary artery disease Brother   . Stroke Brother   . Diabetes Brother   . Diabetes Mother   . Heart failure Mother   . Heart failure Father   . Hypothyroidism Brother   . Coronary artery disease Brother   . Other Brother        colon surgery  . Heart attack Other        Nephew  . Irregular heart beat Daughter   . Cancer Maternal Grandmother        unknown   . Colon cancer Neg Hx   . Stomach cancer Neg Hx   . Liver cancer Neg Hx   . Rectal cancer Neg Hx   .  Esophageal cancer Neg Hx     Social History   Socioeconomic History  . Marital status: Married    Spouse name: Not on file  . Number of children: 4  . Years of education: 50  . Highest education level: Not on file  Occupational History  . Occupation: retired  Tobacco Use  . Smoking status: Never Smoker  . Smokeless tobacco: Never Used  Vaping Use  . Vaping Use: Never used  Substance and Sexual Activity  . Alcohol use: No    Alcohol/week: 0.0 standard drinks    Comment: stopped drinking in 1998  . Drug use: No  . Sexual activity: Not on file  Other Topics Concern  . Not on file  Social History Narrative   Patient is right handed.   Patient does not drink caffeine.   Social Determinants of Health   Financial Resource Strain:   . Difficulty of Paying Living Expenses:   Food Insecurity:   . Worried About Charity fundraiser in the Last Year:   . Arboriculturist in the Last Year:   Transportation Needs:   . Film/video editor (Medical):   Marland Kitchen Lack of Transportation (Non-Medical):   Physical Activity:   . Days of Exercise per Week:   . Minutes of Exercise per Session:   Stress:   . Feeling of Stress :   Social Connections:   . Frequency of Communication with Friends and Family:   . Frequency of Social  Gatherings with Friends and Family:   . Attends Religious Services:   . Active Member of Clubs or Organizations:   . Attends Archivist Meetings:   Marland Kitchen Marital Status:   Intimate Partner Violence:   . Fear of Current or Ex-Partner:   . Emotionally Abused:   Marland Kitchen Physically Abused:   . Sexually Abused:      PHYSICAL EXAM:  VS: BP (!) 138/70   Pulse 62   Ht 5\' 9"  (1.753 m)   Wt (!) 212 lb (96.2 kg)   BMI 31.31 kg/m  Physical Exam Gen: NAD, alert, cooperative with exam, well-appearing MSK:  Left Achilles: Obvious swelling and Haglund's deformity. Able to raise up on tiptoes but does have pain. Normal range of motion. No redness or ecchymosis. Neurovascularly intact     ASSESSMENT & PLAN:   Achilles tendon pain Some improvement with heel lifts in cam walker.  Does get pain and swelling from time to time. -Counseled on home exercise therapy and supportive care. -Counseled on heel lifts and transitioning out of the cam walker. -Referral to physical therapy. -Could consider injection.

## 2020-06-30 NOTE — Progress Notes (Signed)
Medication Samples have been provided to the patient.  Drug name: Pennsaid       Strength: 2%        Qty: 2 boxes  LOT: [id number]  Exp.Date: 01/2021  Dosing instructions: use a pea size amount and rub gently.  The patient has been instructed regarding the correct time, dose, and frequency of taking this medication, including desired effects and most common side effects.   Sherrie George, [place] 11:04 AM 06/30/2020

## 2020-07-02 NOTE — Progress Notes (Deleted)
Cardiology Office Note   Date:  07/02/2020   ID:  [name] [name], DOB [birth year], MRN [id number]  PCP:  [name], MD    No chief complaint on file.    Wt Readings from Last 3 Encounters:  06/30/20 (!) 212 lb (96.2 kg)  06/23/20 218 lb 6.4 oz (99.1 kg)  06/06/20 211 lb (95.7 kg)       History of Present Illness: [name] [name] is a 74 y.o. male  with history ofCAD (CABG 2000, stent to RCA 2009), paroxysmal atrial fibrillation,PSVT,CKD III, noncompliance, orthostatic hypotension, iron deficiency anemia, prior gastric erosions/ulcer, ?chronic diastolic CHF (no diastolic dysfunction on echo and normal BNP), bipolar depression, DM, HTN, HLD, kidney stones, OSA, RBBB, deconditoning, thyroid disease, spondylosis and back pain.  He has prior history of chest discomfort, palpitations and dyspnearequiring med titration and repeat caths/monitorsover the years. His last PCI was in 2009. A CPX in 2011 was unrevealing, felt to reflect deconditioning and perhaps mild chronotropic incompetence. He had caths in 2015, 2016, and 12/2017 showing stable anatomy with patent bypass grafts and medical therapy recommended withpossible component of microvascular angina. In 03/2016 he was admitted near syncope and transient confusion. MRI was negative for stroke. EEG was normal. He was seen by neurology. There was no significant carotid artery stenosis on CTA. Echocardiogram demonstrated normal LV function with mild diastolic dysfunction. No arrhythmias noted on telemetry. TIA could not be ruled out. Aspirin was changed to Plavix. He was bradycardic and his beta blocker dose was reduced. He was readmitted 03/2016 with lightheadedness, confusion, slurred speech and right facial droop,left hand tremors, and intermittent hypotension. There was also reported history of occasional episodes of dizziness, slurred speech, confusion followed by lethargy. Patient would also fall asleep easily without memory  of the event.He has since been diagnosed with narcolepsy. His metoprolol was discontinued at one point but eventually restarted. He also required midodrine previously for orthostasis although does not appear to currently be on this.A monitor did demonstrate atrial fib/flutter so he was changed from Plavix to warfarin. Metoprolol was subsequently increased. In 09/2016 he was started on amiodarone. There does appear to be a history of compliance issues - notably including taking midodrine incorrectly, not reducing amiodarone as instructed (was on 400mg  daily for a while), or - per last OV 06/2018 - not filling amiodarone since 2017. At last OV he was reporting increased palpitations and decreased exercise tolerance with DOE. He was in NSR. BNP was normal. 2D echo 07/2018 showed EF 32-67%, normal diastolic function, mild LAE. Event monitor showed NSR with PACs/PVCs and short runs of SVT, lowest HR 45bpm. Carotid duplex 07/2018 showed minimal plaque without significant stenosis. He was supposed to follow-up 1 month after that visit but never came. In 11/2018 he was admitted for seizures suspected due to noncompliance for financial reasons. He also reported chest pain in setting of noncompliance.  Repeat cath in 2021.  Stable anatomy and normal right heart pressures. His SHOB was better after leaving the hospital.    He had left facial swelling in 4/21. Occluded salivary gland was considered as a cause.  Past Medical History:  Diagnosis Date  . Acquired atrophy of thyroid 06/05/2013   Overview:  July 2014: controlled on synthroid 69mcg since 2012 May 2015: decreased to Synthroid 50 mcg  Last Assessment & Plan:  Pt doing well with most recent TSH within normal range.  Will continue current dosage of Synthroid 4mcg. Pt reminded to take medication on empty  stomach. Will continue to monitor with periodic laboratory assessment in 3 months.   . Anemia    hemoglobin 7.4, iron deficiency, January, 2011, 2 unit  transfusion, endoscopy normal, capsule endoscopy February, 2011 no small bowel abnormalities.   Most likely source gastric erosions, followed by GI  . Anxiety   . Bradycardia   . CAD (coronary artery disease)    A. CABG in 2000,status post cardiac cath in 2006, 2009 ....continued chest pain and SOB despite oral medication adjestments including Ranexa. B. Cath November 2009/ mRCA - 2.75 x 23 Abbott Xience V drug-eluting stent ...11/26/2008 to distal  RCA leading to acute marginal.  C. Cath 07/2012 for CP - stable anatomy, med rx. d. cath 2015 and 05/30/2015 stable anatomy, consider Myoview if has CP again  . Carotid artery disease ([place]) 08/01/2015   Doppler, May 29, 2015, 1-39% bilateral ICA   . Cerebral ischemia    MRI November, 2010, chronic microvascular ischemia  . CKD (chronic kidney disease), stage III   . Concussion   . Depression    Bipolar  . Diabetes mellitus ([place])   . Edema   . Essential hypertension 06/05/2013   Overview:  July 2014: Controlled with Bystolic Sep 6761: Changed to atenolol.  Last Assessment & Plan:  Will change to atenolol for cost and follow Improved.  Medication compliance strongly encouraged BP: 116/64 mmHg    Overview:  July 2014: Controlled with Bystolic Sep 9509: Changed to atenolol.  Last Assessment & Plan:  Will change to atenolol for cost and follow  . Falling episodes    these have occurred in the past and again recurring 2011  . Family history of adverse reaction to anesthesia    "mother died during bypass surgery but not sure if it has to do with anesthesia"  . Gastric ulcer   . GERD (gastroesophageal reflux disease)   . H/O medication noncompliance    Due to loss of insurance  . H/O multiple concussions   . Hard of hearing   . History of blood transfusion 12/20/2013  . Hx of CABG    2000,  / one median sternotomy suture broken her chest x-ray November, 2010, no clinical significance  . Hyperlipidemia   . Hypertension    pt. denies  . Iron deficiency  anemia   . Long term (current) use of anticoagulants [Z79.01] 07/20/2016  . Low back pain 06/12/2009   Qualifier: Diagnosis of  By: [name]   . Nephrolithiasis   . Orthostasis   . OSA (obstructive sleep apnea)   . PAF (paroxysmal atrial fibrillation) ([place])    a. dx 2017, started on amiodarone/warfarin but patient intermittently noncompliant.  Marland Kitchen PSVT (paroxysmal supraventricular tachycardia) ([place])   . RBBB 07/09/2009   Qualifier: Diagnosis of  By: [name], MD, [name] [name]   . RLS (restless legs syndrome) 09/19/2009   Qualifier: Diagnosis of  By: [name]   Overview:  July 2014: Controlled with Mirapex  Last Assessment & Plan:  Patient is doing well. Will continue current management and follow clinically.  . Seizure disorder ([place]) 01/09/2017  . Spondylosis    C5-6, C6-7 MRI 2010  . Syncope 03/2016  . Thyroid disease   . Tubulovillous adenoma of colon 2007  . Vitamin D deficiency 05/17/2017  . Wears glasses     Past Surgical History:  Procedure Laterality Date  . ANTERIOR CERVICAL DECOMP/DISCECTOMY FUSION N/A 05/31/2016   Procedure: ANTERIOR CERVICAL DECOMPRESSION/DISCECTOMY FUSION CERVICAL FIVE-SIX,CERVICAL SIX-SEVEN;  Surgeon: [name], MD;  Location[facility]URO ORS;  Service: Neurosurgery;  Laterality: N/A;  . CARDIAC CATHETERIZATION N/A 05/30/2015   Procedure: Left Heart Cath and Coronary Angiography;  Surgeon: [name], MD;  Location: [facility] CV LAB;  Service: Cardiovascular;  Laterality: N/A;  . COLONOSCOPY    . CORONARY ARTERY BYPASS GRAFT     2000  . ESOPHAGOGASTRODUODENOSCOPY    . LEFT HEART CATH AND CORS/GRAFTS ANGIOGRAPHY N/A 12/15/2017   Procedure: LEFT HEART CATH AND CORS/GRAFTS ANGIOGRAPHY;  Surgeon: [name], MD;  Location: [place] CV LAB;  Service: Cardiovascular;  Laterality: N/A;  . LEFT HEART CATHETERIZATION WITH CORONARY/GRAFT ANGIOGRAM N/A 08/01/2012   Procedure: LEFT HEART CATHETERIZATION WITH Beatrix Fetters;   Surgeon: [name], MD;  Location: [facility] CATH LAB;  Service: Cardiovascular;  Laterality: N/A;  . LEFT HEART CATHETERIZATION WITH CORONARY/GRAFT ANGIOGRAM N/A 01/03/2015   Procedure: LEFT HEART CATHETERIZATION WITH Beatrix Fetters;  Surgeon: [name], MD;  Location: [facility] CATH LAB;  Service: Cardiovascular;  Laterality: N/A;  . NASAL SEPTUM SURGERY     UP3  . PERCUTANEOUS CORONARY STENT INTERVENTION (PCI-S)  10/2008   mRCA PCI  2.75 x 23 Abbott Xience V drug-eluting stent   . RIGHT/LEFT HEART CATH AND CORONARY/GRAFT ANGIOGRAPHY N/A 01/07/2020   Procedure: RIGHT/LEFT HEART CATH AND CORONARY/GRAFT ANGIOGRAPHY;  Surgeon: [name], MD;  Location: [place] CV LAB;  Service: Cardiovascular;  Laterality: N/A;  . ULTRASOUND GUIDANCE FOR VASCULAR ACCESS  12/15/2017   Procedure: Ultrasound Guidance For Vascular Access;  Surgeon: [name], MD;  Location: [place] CV LAB;  Service: Cardiovascular;;     Current Outpatient Medications  Medication Sig Dispense Refill  . albuterol (VENTOLIN HFA) 108 (90 Base) MCG/ACT inhaler Inhale 2 puffs into the lungs every 6 (six) hours as needed for wheezing or shortness of breath. 18 g 2  . amLODipine (NORVASC) 5 MG tablet Take 1 tablet (5 mg total) by mouth daily. 90 tablet 1  . atorvastatin (LIPITOR) 80 MG tablet TAKE 1 TABLET (80 MG TOTAL) BY MOUTH DAILY. 90 tablet 3  . Continuous Blood Gluc Receiver (FREESTYLE LIBRE 14 DAY READER) DEVI For continuous Blood Glucose monitoring 1 each 0  . Continuous Blood Gluc Sensor (FREESTYLE LIBRE 14 DAY SENSOR) MISC 1 Device by Does not apply route every 14 (fourteen) days. 6 each 3  . divalproex (DEPAKOTE ER) 250 MG 24 hr tablet Take 3 tablets (750 mg total) by mouth at bedtime. 180 tablet 0  . Dulaglutide (TRULICITY) 0.96 EA/5.4UJ SOPN Inject 0.5 mLs (0.75 mg total) into the skin once a week. 4 pen 11  . ELIQUIS 5 MG TABS tablet TAKE 1 TABLET (5 MG TOTAL) BY MOUTH 2 (TWO) TIMES DAILY. 60  tablet 5  . fenofibrate (TRICOR) 145 MG tablet TAKE 1 TABLET BY MOUTH ONCE DAILY 90 tablet 1  . furosemide (LASIX) 20 MG tablet TAKE 1 TABLET BY MOUTH DAILY AS NEEDED FOR EDEMA 30 tablet 3  . glucose blood (ACCU-CHEK AVIVA) test strip Use as directed to check blood sugar 4 times daily.  DX E11.9 200 each 6  . insulin aspart (NOVOLOG FLEXPEN) 100 UNIT/ML FlexPen 3 times a day (just before each meal) 65-70-110 units 30 pen 11  . insulin NPH Human (NOVOLIN N) 100 UNIT/ML injection Inject 0.8 mLs (80 Units total) into the skin at bedtime. 30 mL 11  . Insulin Pen Needle 31G X 8 MM MISC 1 each by Does not apply route  3 (three) times daily. E11.9 60 each 0  . INSULIN SYRINGE 1CC/29G 29G X 1/2" 1 ML MISC 1 each by Does not apply route daily. Use to inject insulin daily 30 each 0  . isosorbide mononitrate (IMDUR) 60 MG 24 hr tablet Take 1 tablet (60 mg total) by mouth daily. 90 tablet 3  . levothyroxine (SYNTHROID) 100 MCG tablet TAKE 1 TABLET (100 MCG TOTAL) BY MOUTH DAILY. 90 tablet 1  . metoprolol succinate (TOPROL-XL) 50 MG 24 hr tablet TAKE 1 AND 1/2 TABLETS EVERY DAY FOR A TOTAL OF 75 MG 135 tablet 3  . nitroGLYCERIN (NITROSTAT) 0.4 MG SL tablet PLACE 1 TABLET UNDER THE TONGUE EVERY 5 MINUTES AS NEEDED FOR CHEST PAIN. 25 tablet 1  . pantoprazole (PROTONIX) 40 MG tablet Take 1 tablet (40 mg total) by mouth daily. 90 tablet 3  . pramipexole (MIRAPEX) 1 MG tablet Take 0.5-1 tablets (0.5-1 mg total) by mouth at bedtime. 90 tablet 0  . ranolazine (RANEXA) 500 MG 12 hr tablet TAKE 1 TABLET BY MOUTH TWICE DAILY 60 tablet 5  . rOPINIRole (REQUIP) 1 MG tablet TAKE 1 TABLET BY MOUTH AT BEDTIME 30 tablet 1  . sertraline (ZOLOFT) 100 MG tablet TAKE 1 TABLET (100 MG TOTAL) BY MOUTH 2 (TWO) TIMES DAILY. 180 tablet 1  . tiZANidine (ZANAFLEX) 4 MG tablet TAKE 1 TABLET (4 MG TOTAL) BY MOUTH EVERY 6 (SIX) HOURS AS NEEDED FOR MUSCLE SPASMS. 40 tablet 2  . Vitamin D, Ergocalciferol, (DRISDOL) 1.25 MG (50000 UNIT) CAPS  capsule Take 1 capsule (50,000 Units total) by mouth every 7 (seven) days. 4 capsule 4   No current facility-administered medications for this visit.    Allergies:   Morphine and Shellfish-derived products    Social History:  The patient  reports that he has never smoked. He has never used smokeless tobacco. He reports that he does not drink alcohol and does not use drugs.   Family History:  The patient's ***family history includes Cancer in his maternal grandmother; Coronary artery disease in his brother and brother; Diabetes in his brother, brother, and mother; Heart attack in an other family member; Heart failure in his father and mother; Hypothyroidism in his brother; Irregular heart beat in his daughter; Other in his brother; Pancreatic cancer in his brother; Stroke in his brother.    ROS:  Please see the history of present illness.   Otherwise, review of systems are positive for ***.   All other systems are reviewed and negative.    PHYSICAL EXAM: VS:  There were no vitals taken for this visit. , BMI There is no height or weight on file to calculate BMI. GEN: Well nourished, well developed, in no acute distress  HEENT: normal  Neck: no JVD, carotid bruits, or masses Cardiac: ***RRR; no murmurs, rubs, or gallops,no edema  Respiratory:  clear to auscultation bilaterally, normal work of breathing GI: soft, nontender, nondistended, + BS MS: no deformity or atrophy  Skin: warm and dry, no rash Neuro:  Strength and sensation are intact Psych: euthymic mood, full affect   EKG:   The ekg ordered today demonstrates ***   Recent Labs: 05/14/2020: ALT 26; BUN 21; Creatinine, Ser 1.73; Hemoglobin 13.2; Platelets 202.0; Potassium 4.2; Sodium 141; TSH 3.04   Lipid Panel    Component Value Date/Time   CHOL 113 05/14/2020 0836   CHOL 99 (L) 09/27/2017 0823   TRIG 92.0 05/14/2020 0836   HDL 33.20 (L) 05/14/2020 0836   HDL 30 (  L) 09/27/2017 0823   CHOLHDL 3 05/14/2020 0836   VLDL 18.4  05/14/2020 0836   LDLCALC 61 05/14/2020 0836   LDLCALC 35 09/27/2017 0823   LDLDIRECT 77.0 08/20/2019 0924     Other studies Reviewed: Additional studies/ records that were reviewed today with results demonstrating: ***.   ASSESSMENT AND PLAN:  1.  CAD: 2. AFib: 3. Obesity/DOE: 4. DM: 5. Anticoagulated:   Current medicines are reviewed at length with the patient today.  The patient concerns regarding his medicines were addressed.  The following changes have been made:  No change***  Labs/ tests ordered today include: *** No orders of the defined types were placed in this encounter.   Recommend 150 minutes/week of aerobic exercise Low fat, low carb, high fiber diet recommended  Disposition:   FU in ***   Signed, Larae Grooms, MD  07/02/2020 7:54 AM    [facility] [place], [place], [place]  [address] Phone: [phone]; Fax: [phone]

## 2020-07-03 ENCOUNTER — Ambulatory Visit: Payer: Medicare PPO | Admitting: Interventional Cardiology

## 2020-07-09 ENCOUNTER — Other Ambulatory Visit: Payer: Self-pay | Admitting: Cardiology

## 2020-07-09 MED FILL — tiZANidine HCL 4 MG TABS: 4 | 10 days supply | Qty: 40 | Fill #2

## 2020-07-09 MED FILL — FUROSEMIDE 20 MG TAB: 20 | 30 days supply | Qty: 30 | Fill #2

## 2020-07-09 MED FILL — VIT D2 1.25 MG (50,000 UNIT: 1.25 MG | 28 days supply | Qty: 4 | Fill #2

## 2020-07-10 ENCOUNTER — Other Ambulatory Visit: Payer: Self-pay | Admitting: Interventional Cardiology

## 2020-07-10 MED FILL — NovoLIN N 100 UNIT/ML SUSP: 100 | 38 days supply | Qty: 30 | Fill #5

## 2020-07-10 MED FILL — AMLODIPINE BESYLATE 5 MG TA: 5 | 90 days supply | Qty: 90 | Fill #0

## 2020-07-15 ENCOUNTER — Telehealth: Payer: Self-pay

## 2020-07-15 NOTE — Telephone Encounter (Signed)
[place] requesting medical records: [facility]  Document received: Signed medical records request  Forwarded above request to HIM. Document and fax confirmation have been placed in the faxed file for future reference.

## 2020-07-16 ENCOUNTER — Ambulatory Visit: Payer: Medicare PPO | Admitting: Rehabilitative and Restorative Service Providers"

## 2020-07-21 ENCOUNTER — Ambulatory Visit: Payer: Medicare PPO | Admitting: Rehabilitative and Restorative Service Providers"

## 2020-07-23 ENCOUNTER — Encounter: Payer: Self-pay | Admitting: Physician Assistant

## 2020-07-23 ENCOUNTER — Ambulatory Visit (INDEPENDENT_AMBULATORY_CARE_PROVIDER_SITE_OTHER): Payer: Medicare PPO | Admitting: Physician Assistant

## 2020-07-23 ENCOUNTER — Other Ambulatory Visit: Payer: Self-pay

## 2020-07-23 VITALS — BP 130/60 | HR 72 | Ht 69.0 in | Wt 213.8 lb

## 2020-07-23 DIAGNOSIS — I5032 Chronic diastolic (congestive) heart failure: Secondary | ICD-10-CM

## 2020-07-23 DIAGNOSIS — I480 Paroxysmal atrial fibrillation: Secondary | ICD-10-CM

## 2020-07-23 DIAGNOSIS — R42 Dizziness and giddiness: Secondary | ICD-10-CM | POA: Diagnosis not present

## 2020-07-23 DIAGNOSIS — I25700 Atherosclerosis of coronary artery bypass graft(s), unspecified, with unstable angina pectoris: Secondary | ICD-10-CM | POA: Diagnosis not present

## 2020-07-23 DIAGNOSIS — E785 Hyperlipidemia, unspecified: Secondary | ICD-10-CM | POA: Diagnosis not present

## 2020-07-23 MED ORDER — ISOSORBIDE MONONITRATE ER 60 MG PO TB24
90.0000 mg | ORAL_TABLET | Freq: Every day | ORAL | 3 refills | Status: DC
Start: 2020-07-23 — End: 2020-10-23

## 2020-07-23 MED FILL — ISOSORBIDE MN ER 60 MG TAB: 60 | 90 days supply | Qty: 135 | Fill #0

## 2020-07-23 NOTE — Patient Instructions (Addendum)
Medication Instructions:  Your physician has recommended you make the following change in your medication:  1.  INCREASE Imdur to 60 mg taking 1 1/2 tablet daily  *If you need a refill on your cardiac medications before your next appointment, please call your pharmacy*   Lab Work: None ordered  If you have labs (blood work) drawn today and your tests are completely normal, you will receive your results only by: Marland Kitchen MyChart Message (if you have MyChart) OR . A paper copy in the mail If you have any lab test that is abnormal or we need to change your treatment, we will call you to review the results.   Testing/Procedures: Your physician has recommended that you wear an event monitor. Event monitors are medical devices that record the heart's electrical activity. Doctors most often [place] these monitors to diagnose arrhythmias. Arrhythmias are problems with the speed or rhythm of the heartbeat. The monitor is a small, portable device. You can wear one while you do your normal daily activities. This is usually used to diagnose what is causing palpitations/syncope (passing out).     Follow-Up: At [facility], you and your health needs are our priority.  As part of our continuing mission to provide you with exceptional heart care, we have created designated Provider Care Teams.  These Care Teams include your primary Cardiologist (physician) and Advanced Practice Providers (APPs -  Physician Assistants and Nurse Practitioners) who all work together to provide you with the care you need, when you need it.  We recommend signing up for the patient portal called "MyChart".  Sign up information is provided on this After Visit Summary.  MyChart is used to connect with patients for Virtual Visits (Telemedicine).  Patients are able to view lab/test results, encounter notes, upcoming appointments, etc.  Non-urgent messages can be sent to your provider as well.   To learn more about what you can do with  MyChart, go to [email or web address].    Your next appointment:   3 month(s)  The format for your next appointment:   In Person  Provider:   You may see Larae Grooms, MD or one of the following Advanced Practice Providers on your designated Care Team:    Melina Copa, PA-C  Ermalinda Barrios, PA-C    Other Instructions  Ambulatory Cardiac Monitoring An ambulatory cardiac monitor is a small recording device that is used to detect abnormal heart rhythms (arrhythmias). Most monitors are connected by wires to flat, sticky disks (electrodes) that are then attached to your chest. You may need to wear a monitor if you have had symptoms such as:  Fast heartbeats (palpitations).  Dizziness.  Fainting or light-headedness.  Unexplained weakness.  Shortness of breath. There are several types of monitors. Some common monitors include:  Holter monitor. This records your heart rhythm continuously, usually for 24-48 hours.  Event (episodic) monitor. This monitor has a symptoms button, and when pushed, it will begin recording. You need to activate this monitor to record when you have a heart-related symptom.  Automatic detection monitor. This monitor will begin recording when it detects an abnormal heartbeat. What are the risks? Generally, these devices are safe to use. However, it is possible that the skin under the electrodes will become irritated. How to prepare for monitoring Your health care provider will prepare your chest for the electrode placement and show you how to use the monitor.  Do not apply lotions to your chest before monitoring.  Follow directions on how to care  for the monitor, and how to return the monitor when the testing period is complete. How to use your cardiac monitor  Follow directions about how long to wear the monitor, and if you can take the monitor off in order to shower or bathe. ? Do not let the monitor get wet. ? Do not bathe, swim, or use a hot tub  while wearing the monitor.  Keep your skin clean. Do not put body lotion or moisturizer on your chest.  Change the electrodes as told by your health care provider, or any time they stop sticking to your skin. You may need to use medical tape to keep them on.  Try to put the electrodes in slightly different places on your chest to help prevent skin irritation. Follow directions from your health care provider about where to place the electrodes.  Make sure the monitor is safely clipped to your clothing or in a location close to your body as recommended by your health care provider.  If your monitor has a symptoms button, press the button to mark an event as soon as you feel a heart-related symptom, such as: ? Dizziness. ? Weakness. ? Light-headedness. ? Palpitations. ? Thumping or pounding in your chest. ? Shortness of breath. ? Unexplained weakness.  Keep a diary of your activities, such as walking, doing chores, and taking medicine. It is very important to note what you were doing when you pushed the button to record your symptoms. This will help your health care provider determine what might be contributing to your symptoms.  Send the recorded information as recommended by your health care provider. It may take some time for your health care provider to process the results.  Change the batteries as told by your health care provider.  Keep electronic devices away from your monitor. These include: ? Tablets. ? MP3 players. ? Cell phones.  While wearing your monitor you should avoid: ? Electric blankets. ? Armed forces operational officer. ? Electric toothbrushes. ? Microwave ovens. ? Magnets. ? Metal detectors. Get help right away if:  You have chest pain.  You have shortness of breath or extreme difficulty breathing.  You develop a very fast heartbeat that does not get better.  You develop dizziness that does not go away.  You faint or constantly feel like you are about to  faint. Summary  An ambulatory cardiac monitor is a small recording device that is used to detect abnormal heart rhythms (arrhythmias).  Make sure you understand how to send the information from the monitor to your health care provider.  It is important to press the button on the monitor when you have any heart-related symptoms.  Keep a diary of your activities, such as walking, doing chores, and taking medicine. It is very important to note what you were doing when you pushed the button to record your symptoms. This will help your health care provider learn what might be causing your symptoms. This information is not intended to replace advice given to you by your health care provider. Make sure you discuss any questions you have with your health care provider. Document Revised: 11/04/2017 Document Reviewed: 11/06/2016 Elsevier Patient Education  2020 [employer].

## 2020-07-23 NOTE — Progress Notes (Signed)
Cardiology Office Note    Date:  07/23/2020   ID:  [name] [name], DOB [birth year], MRN [id number]  PCP:  [name], MD  Cardiologist:  [name]   Chief Complaint: Marin Comment Edema  History of Present Illness:   [name] [name] is a 74 y.o. male with hx of CAD s/p CABG, PAF, PSVT, CKD stage III, noncompliance, orthostatic hypotension, iron deficiency anemia, chronicdiastolicCHF,HTN,HLD, DM 2, right bundle branch block and history of kidney stone presents for LE edema.   Cardiac catheterization in 2015, 2016 and the 2019 showed stable anatomy with patent bypass grafts, medical therapy was recommended for possible microvascular angina. He had near syncope and transient confusion in 2017. MRI of the head was negative for stroke. There was no significant carotid artery disease by CTA. TIA could not be ruled out. Therefore aspirin was switched to Plavix. Previous heart monitor demonstrated atrial fibrillation/atrial flutter and Plavix was transitioned to Coumadin which later switched to Eliquis.  Last cath 01/2020 for possible progressive angina showed stable anatomy and normal right heart pressures. Echo 01/2020 showed LVEF of 60-65%.   Last seen by [name] 03/2020.  Patien is here for follow-up.  He reports intermittent chest tightness, occurring once or twice per day.  He states that he was doing fine while on 60 mg of Imdur twice a day.  Currently on 60 mg daily.  He also reports intermittent episodes of palpitation with dizziness.  This is occurring every 2 to 3 weeks.  he needs to lay down for at least 1 hour and then his symptoms eventually subsides.  He denies lower extremity edema, orthopnea, PND or syncope.  Patient had a mechanical fall couple of months ago and had injury to his left ankle.  Past Medical History:  Diagnosis Date  . Acquired atrophy of thyroid 06/05/2013   Overview:  July 2014: controlled on synthroid 7mcg since 2012 May 2015: decreased to Synthroid 50  mcg  Last Assessment & Plan:  Pt doing well with most recent TSH within normal range.  Will continue current dosage of Synthroid 60mcg. Pt reminded to take medication on empty stomach. Will continue to monitor with periodic laboratory assessment in 3 months.   . Anemia    hemoglobin 7.4, iron deficiency, January, 2011, 2 unit transfusion, endoscopy normal, capsule endoscopy February, 2011 no small bowel abnormalities.   Most likely source gastric erosions, followed by GI  . Anxiety   . Bradycardia   . CAD (coronary artery disease)    A. CABG in 2000,status post cardiac cath in 2006, 2009 ....continued chest pain and SOB despite oral medication adjestments including Ranexa. B. Cath November 2009/ mRCA - 2.75 x 23 Abbott Xience V drug-eluting stent ...11/26/2008 to distal  RCA leading to acute marginal.  C. Cath 07/2012 for CP - stable anatomy, med rx. d. cath 2015 and 05/30/2015 stable anatomy, consider Myoview if has CP again  . Carotid artery disease ([place]) 08/01/2015   Doppler, May 29, 2015, 1-39% bilateral ICA   . Cerebral ischemia    MRI November, 2010, chronic microvascular ischemia  . CKD (chronic kidney disease), stage III   . Concussion   . Depression    Bipolar  . Diabetes mellitus ([place])   . Edema   . Essential hypertension 06/05/2013   Overview:  July 2014: Controlled with Bystolic Sep 2778: Changed to atenolol.  Last Assessment & Plan:  Will change to atenolol for cost and follow Improved.  Medication compliance strongly encouraged BP:  116/64 mmHg    Overview:  July 2014: Controlled with Bystolic Sep 8563: Changed to atenolol.  Last Assessment & Plan:  Will change to atenolol for cost and follow  . Falling episodes    these have occurred in the past and again recurring 2011  . Family history of adverse reaction to anesthesia    "mother died during bypass surgery but not sure if it has to do with anesthesia"  . Gastric ulcer   . GERD (gastroesophageal reflux disease)   . H/O medication  noncompliance    Due to loss of insurance  . H/O multiple concussions   . Hard of hearing   . History of blood transfusion 12/20/2013  . Hx of CABG    2000,  / one median sternotomy suture broken her chest x-ray November, 2010, no clinical significance  . Hyperlipidemia   . Hypertension    pt. denies  . Iron deficiency anemia   . Long term (current) use of anticoagulants [Z79.01] 07/20/2016  . Low back pain 06/12/2009   Qualifier: Diagnosis of  By: [name]   . Nephrolithiasis   . Orthostasis   . OSA (obstructive sleep apnea)   . PAF (paroxysmal atrial fibrillation) ([place])    a. dx 2017, started on amiodarone/warfarin but patient intermittently noncompliant.  Marland Kitchen PSVT (paroxysmal supraventricular tachycardia) ([place])   . RBBB 07/09/2009   Qualifier: Diagnosis of  By: [name], MD, [name] [name]   . RLS (restless legs syndrome) 09/19/2009   Qualifier: Diagnosis of  By: [name]   Overview:  July 2014: Controlled with Mirapex  Last Assessment & Plan:  Patient is doing well. Will continue current management and follow clinically.  . Seizure disorder ([place]) 01/09/2017  . Spondylosis    C5-6, C6-7 MRI 2010  . Syncope 03/2016  . Thyroid disease   . Tubulovillous adenoma of colon 2007  . Vitamin D deficiency 05/17/2017  . Wears glasses     Past Surgical History:  Procedure Laterality Date  . ANTERIOR CERVICAL DECOMP/DISCECTOMY FUSION N/A 05/31/2016   Procedure: ANTERIOR CERVICAL DECOMPRESSION/DISCECTOMY FUSION CERVICAL FIVE-SIX,CERVICAL SIX-SEVEN;  Surgeon: [name], MD;  Location: [facility];  Service: Neurosurgery;  Laterality: N/A;  . CARDIAC CATHETERIZATION N/A 05/30/2015   Procedure: Left Heart Cath and Coronary Angiography;  Surgeon: [name], MD;  Location: [facility];  Service: Cardiovascular;  Laterality: N/A;  . COLONOSCOPY    . CORONARY ARTERY BYPASS GRAFT     2000  . ESOPHAGOGASTRODUODENOSCOPY    . LEFT HEART CATH AND CORS/GRAFTS ANGIOGRAPHY N/A  12/15/2017   Procedure: LEFT HEART CATH AND CORS/GRAFTS ANGIOGRAPHY;  Surgeon: [name], MD;  Location: [facility];  Service: Cardiovascular;  Laterality: N/A;  . LEFT HEART CATHETERIZATION WITH CORONARY/GRAFT ANGIOGRAM N/A 08/01/2012   Procedure: LEFT HEART CATHETERIZATION WITH Beatrix Fetters;  Surgeon: [name], MD;  Location[facility];  Service: Cardiovascular;  Laterality: N/A;  . LEFT HEART CATHETERIZATION WITH CORONARY/GRAFT ANGIOGRAM N/A 01/03/2015   Procedure: LEFT HEART CATHETERIZATION WITH Beatrix Fetters;  Surgeon: [name], MD;  Location: [facility] CATH LAB;  Service: Cardiovascular;  Laterality: N/A;  . NASAL SEPTUM SURGERY     UP3  . PERCUTANEOUS CORONARY STENT INTERVENTION (PCI-S)  10/2008   mRCA PCI  2.75 x 23 Abbott Xience V drug-eluting stent   . RIGHT/LEFT HEART CATH AND CORONARY/GRAFT ANGIOGRAPHY N/A 01/07/2020   Procedure: RIGHT/LEFT HEART CATH AND CORONARY/GRAFT ANGIOGRAPHY;  Surgeon: Tamala Julian,  Lynnell Dike, MD;  Location: [facility];  Service: Cardiovascular;  Laterality: N/A;  . ULTRASOUND GUIDANCE FOR VASCULAR ACCESS  12/15/2017   Procedure: Ultrasound Guidance For Vascular Access;  Surgeon: [name], MD;  Location: [facility];  Service: Cardiovascular;;    Current Medications: Prior to Admission medications   Medication Sig Start Date End Date Taking? Authorizing Provider  albuterol (VENTOLIN HFA) 108 (90 Base) MCG/ACT inhaler Inhale 2 puffs into the lungs every 6 (six) hours as needed for wheezing or shortness of breath. 06/25/19   [name], MD  amLODipine (NORVASC) 5 MG tablet TAKE 1 TABLET (5 MG TOTAL) BY MOUTH DAILY. 07/10/20   [name], MD  atorvastatin (LIPITOR) 80 MG tablet TAKE 1 TABLET (80 MG TOTAL) BY MOUTH DAILY. 03/11/20   [name], MD  Continuous Blood Gluc Receiver (FREESTYLE LIBRE 14 DAY READER) DEVI For continuous Blood Glucose monitoring 06/23/20   Renato Shin, MD   Continuous Blood Gluc Sensor (FREESTYLE LIBRE 14 DAY SENSOR) MISC 1 Device by Does not apply route every 14 (fourteen) days. 09/06/19   Renato Shin, MD  divalproex (DEPAKOTE ER) 250 MG 24 hr tablet Take 3 tablets (750 mg total) by mouth at bedtime. 11/07/18   Ghimire, Henreitta Leber, MD  Dulaglutide (TRULICITY) 3.76 EG/3.1DV SOPN Inject 0.5 mLs (0.75 mg total) into the skin once a week. 06/23/20   Renato Shin, MD  ELIQUIS 5 MG TABS tablet TAKE 1 TABLET (5 MG TOTAL) BY MOUTH 2 (TWO) TIMES DAILY. 05/23/20   [name], MD  fenofibrate (TRICOR) 145 MG tablet TAKE 1 TABLET BY MOUTH ONCE DAILY 05/20/20   [name], MD  furosemide (LASIX) 20 MG tablet TAKE 1 TABLET BY MOUTH DAILY AS NEEDED FOR EDEMA 05/07/20   [name], MD  glucose blood (ACCU-CHEK AVIVA) test strip Use as directed to check blood sugar 4 times daily.  DX E11.9 02/05/19   [name], MD  insulin aspart (NOVOLOG FLEXPEN) 100 UNIT/ML FlexPen 3 times a day (just before each meal) 65-70-110 units 06/23/20   Renato Shin, MD  insulin NPH Human (NOVOLIN N) 100 UNIT/ML injection Inject 0.8 mLs (80 Units total) into the skin at bedtime. 09/06/19   Renato Shin, MD  Insulin Pen Needle 31G X 8 MM MISC 1 each by Does not apply route 3 (three) times daily. E11.9 11/12/19   Renato Shin, MD  INSULIN SYRINGE 1CC/29G 29G X 1/2" 1 ML MISC 1 each by Does not apply route daily. Use to inject insulin daily 08/28/19   Renato Shin, MD  isosorbide mononitrate (IMDUR) 60 MG 24 hr tablet Take 1 tablet (60 mg total) by mouth daily. 03/18/20   [name], MD  levothyroxine (SYNTHROID) 100 MCG tablet TAKE 1 TABLET (100 MCG TOTAL) BY MOUTH DAILY. 06/20/20   [name], MD  metoprolol succinate (TOPROL-XL) 50 MG 24 hr tablet TAKE 1 AND 1/2 TABLETS EVERY DAY FOR A TOTAL OF 75 MG 02/08/20   [name], MD  nitroGLYCERIN (NITROSTAT) 0.4 MG SL tablet PLACE 1 TABLET UNDER THE TONGUE EVERY 5 MINUTES AS NEEDED FOR CHEST PAIN. 05/20/20    [name], MD  pantoprazole (PROTONIX) 40 MG tablet Take 1 tablet (40 mg total) by mouth daily. 06/10/20   [name], MD  pramipexole (MIRAPEX) 1 MG tablet Take 0.5-1 tablets (0.5-1 mg total) by mouth at bedtime. 06/10/20   [name], MD  ranolazine Jeani Sow)  500 MG 12 hr tablet TAKE 1 TABLET BY MOUTH TWICE DAILY 02/27/20   [name], MD  rOPINIRole (REQUIP) 1 MG tablet TAKE 1 TABLET BY MOUTH AT BEDTIME 04/29/20   [name], MD  sertraline (ZOLOFT) 100 MG tablet TAKE 1 TABLET (100 MG TOTAL) BY MOUTH 2 (TWO) TIMES DAILY. 04/29/20   [name], MD  tiZANidine (ZANAFLEX) 4 MG tablet TAKE 1 TABLET (4 MG TOTAL) BY MOUTH EVERY 6 (SIX) HOURS AS NEEDED FOR MUSCLE SPASMS. 01/31/20   [name], MD  Vitamin D, Ergocalciferol, (DRISDOL) 1.25 MG (50000 UNIT) CAPS capsule Take 1 capsule (50,000 Units total) by mouth every 7 (seven) days. 05/20/20   [name], MD    Allergies:   Morphine and Shellfish-derived products   Social History   Socioeconomic History  . Marital status: Married    Spouse name: Not on file  . Number of children: 4  . Years of education: 58  . Highest education level: Not on file  Occupational History  . Occupation: retired  Tobacco Use  . Smoking status: Never Smoker  . Smokeless tobacco: Never Used  Vaping Use  . Vaping Use: Never used  Substance and Sexual Activity  . Alcohol use: No    Alcohol/week: 0.0 standard drinks    Comment: stopped drinking in 1998  . Drug use: No  . Sexual activity: Not on file  Other Topics Concern  . Not on file  Social History Narrative   Patient is right handed.   Patient does not drink caffeine.   Social Determinants of Health   Financial Resource Strain:   . Difficulty of Paying Living Expenses:   Food Insecurity:   . Worried About Charity fundraiser in the Last Year:   . Arboriculturist in the Last Year:   Transportation Needs:   . Film/video editor (Medical):   Marland Kitchen Lack of  Transportation (Non-Medical):   Physical Activity:   . Days of Exercise per Week:   . Minutes of Exercise per Session:   Stress:   . Feeling of Stress :   Social Connections:   . Frequency of Communication with Friends and Family:   . Frequency of Social Gatherings with Friends and Family:   . Attends Religious Services:   . Active Member of Clubs or Organizations:   . Attends Archivist Meetings:   Marland Kitchen Marital Status:      Family History:  The patient's family history includes Cancer in his maternal grandmother; Coronary artery disease in his brother and brother; Diabetes in his brother, brother, and mother; Heart attack in an other family member; Heart failure in his father and mother; Hypothyroidism in his brother; Irregular heart beat in his daughter; Other in his brother; Pancreatic cancer in his brother; Stroke in his brother.   ROS:   Please see the history of present illness.    ROS All other systems reviewed and are negative.   PHYSICAL EXAM:   VS:  BP 130/60   Pulse 72   Ht 5\' 9"  (1.753 m)   Wt 213 lb 12.8 oz (97 kg)   SpO2 99%   BMI 31.57 kg/m    GEN: Well nourished, well developed, in no acute distress  HEENT: normal  Neck: no JVD, carotid bruits, or masses Cardiac:RRR; no murmurs, rubs, or gallops,no edema  Respiratory:  clear to auscultation bilaterally, normal work of breathing GI: soft, nontender, nondistended, + BS MS: Left leg  brace Skin: warm and dry, no rash Neuro:  Alert and Oriented x 3, Strength and sensation are intact Psych: euthymic mood, full affect  Wt Readings from Last 3 Encounters:  07/23/20 213 lb 12.8 oz (97 kg)  06/30/20 (!) 212 lb (96.2 kg)  06/23/20 218 lb 6.4 oz (99.1 kg)      Studies/Labs Reviewed:   EKG:  EKG is not ordered today.    Recent Labs: 05/14/2020: ALT 26; BUN 21; Creatinine, Ser 1.73; Hemoglobin 13.2; Platelets 202.0; Potassium 4.2; Sodium 141; TSH 3.04   Lipid Panel    Component Value Date/Time   CHOL  113 05/14/2020 0836   CHOL 99 (L) 09/27/2017 0823   TRIG 92.0 05/14/2020 0836   HDL 33.20 (L) 05/14/2020 0836   HDL 30 (L) 09/27/2017 0823   CHOLHDL 3 05/14/2020 0836   VLDL 18.4 05/14/2020 0836   LDLCALC 61 05/14/2020 0836   LDLCALC 35 09/27/2017 0823   LDLDIRECT 77.0 08/20/2019 0924    Additional studies/ records that were reviewed today include:   Echocardiogram: 01/2020 1. Left ventricular ejection fraction, by visual estimation, is 60 to  65%. The left ventricle has normal function. Mildly increased left  ventricular posterior wall thickness. There is mildly increased left  ventricular hypertrophy of the basal septum.  2. The left ventricle has no regional wall motion abnormalities.  3. Left ventricular diastolic parameters are indeterminate.  4. Elevated left ventricular end-diastolic pressure.  5. Global right ventricle has normal systolic function.The right  ventricular size is normal. No increase in right ventricular wall  thickness.  6. Left atrial size was normal.  7. Right atrial size was normal.  8. The mitral valve is normal in structure. Mild mitral valve  regurgitation. No evidence of mitral stenosis.  9. The tricuspid valve is normal in structure. Tricuspid valve  regurgitation is not demonstrated.  10. The aortic valve is normal in structure. Aortic valve regurgitation is  not visualized. No evidence of aortic valve sclerosis or stenosis.  11. The pulmonic valve was normal in structure. Pulmonic valve  regurgitation is trivial.  12. The inferior vena cava is normal in size with <50% respiratory  variability, suggesting right atrial pressure of 8 mmHg.  RIGHT/LEFT HEART CATH AND CORONARY/GRAFT ANGIOGRAPHY 01/2020  Conclusion   Right heart cath demonstrated normal filling pressures and pulmonary artery pressure.  Total occlusion of the mid LAD and diagonal.  Ramus intermedius is also totally occluded.  Mild to moderate disease is noted in the distal  circumflex.  The native right coronary is totally occluded beyond the origin of the PDA.  The bypass graft to the continuation of the right coronary is widely patent.  The bypass graft to the diagonal is widely patent.  The bypass graft to the ramus intermedius is widely patent.  LIMA to the LAD is widely patent.  Left ventricular function was not assessed with this study.  Left ventricular end-diastolic pressure was normal.  RECOMMENDATIONS:   In review of images, no significant change has occurred compared to 2019.  If angina, consider microvascular origin.  Uncertain explanation for the patient's dyspnea.  Diagnostic Dominance: Right   ASSESSMENT & PLAN:    1. Coronary artery disease with unstable angina -Seems patient is dealing with microvascular angina.  Will increase Imdur to 90 mg daily.  Not on aspirin due to need of anticoagulation.  Continue Norvasc, Toprol-XL and Ranexa at current dose.  2.  Paroxysmal atrial fibrillation with palpitations/dizziness -Patient reports intermittent dizziness with palpitation.  He is very symptomatic with this.  Will get event monitor for further assessment.  Continue Eliquis for anticoagulation.  3.  Chronic diastolic heart failure -Volume status stable.  Continue Lasix and Toprol.  4.  Hypertension -Blood pressure stable on current medications    Medication Adjustments/Labs and Tests Ordered: Current medicines are reviewed at length with the patient today.  Concerns regarding medicines are outlined above.  Medication changes, Labs and Tests ordered today are listed in the Patient Instructions below. Patient Instructions  Medication Instructions:  Your physician has recommended you make the following change in your medication:  1.  INCREASE Imdur to 60 mg taking 1 1/2 tablet daily  *If you need a refill on your cardiac medications before your next appointment, please call your pharmacy*   Lab Work: None ordered  If you  have labs (blood work) drawn today and your tests are completely normal, you will receive your results only by: Marland Kitchen MyChart Message (if you have MyChart) OR . A paper copy in the mail If you have any lab test that is abnormal or we need to change your treatment, we will call you to review the results.   Testing/Procedures: Your physician has recommended that you wear an event monitor. Event monitors are medical devices that record the heart's electrical activity. Doctors most often [place] these monitors to diagnose arrhythmias. Arrhythmias are problems with the speed or rhythm of the heartbeat. The monitor is a small, portable device. You can wear one while you do your normal daily activities. This is usually used to diagnose what is causing palpitations/syncope (passing out).     Follow-Up: At [facility], you and your health needs are our priority.  As part of our continuing mission to provide you with exceptional heart care, we have created designated Provider Care Teams.  These Care Teams include your primary Cardiologist (physician) and Advanced Practice Providers (APPs -  Physician Assistants and Nurse Practitioners) who all work together to provide you with the care you need, when you need it.  We recommend signing up for the patient portal called "MyChart".  Sign up information is provided on this After Visit Summary.  MyChart is used to connect with patients for Virtual Visits (Telemedicine).  Patients are able to view lab/test results, encounter notes, upcoming appointments, etc.  Non-urgent messages can be sent to your provider as well.   To learn more about what you can do with MyChart, go to [email or web address].    Your next appointment:   3 month(s)  The format for your next appointment:   In Person  Provider:   You may see Larae Grooms, MD or one of the following Advanced Practice Providers on your designated Care Team:    Melina Copa, PA-C  Ermalinda Barrios,  PA-C    Other Instructions  Ambulatory Cardiac Monitoring An ambulatory cardiac monitor is a small recording device that is used to detect abnormal heart rhythms (arrhythmias). Most monitors are connected by wires to flat, sticky disks (electrodes) that are then attached to your chest. You may need to wear a monitor if you have had symptoms such as:  Fast heartbeats (palpitations).  Dizziness.  Fainting or light-headedness.  Unexplained weakness.  Shortness of breath. There are several types of monitors. Some common monitors include:  Holter monitor. This records your heart rhythm continuously, usually for 24-48 hours.  Event (episodic) monitor. This monitor has a symptoms button, and when pushed, it will begin recording. You need to activate this monitor to  record when you have a heart-related symptom.  Automatic detection monitor. This monitor will begin recording when it detects an abnormal heartbeat. What are the risks? Generally, these devices are safe to use. However, it is possible that the skin under the electrodes will become irritated. How to prepare for monitoring Your health care provider will prepare your chest for the electrode placement and show you how to use the monitor.  Do not apply lotions to your chest before monitoring.  Follow directions on how to care for the monitor, and how to return the monitor when the testing period is complete. How to use your cardiac monitor  Follow directions about how long to wear the monitor, and if you can take the monitor off in order to shower or bathe. ? Do not let the monitor get wet. ? Do not bathe, swim, or use a hot tub while wearing the monitor.  Keep your skin clean. Do not put body lotion or moisturizer on your chest.  Change the electrodes as told by your health care provider, or any time they stop sticking to your skin. You may need to use medical tape to keep them on.  Try to put the electrodes in slightly  different places on your chest to help prevent skin irritation. Follow directions from your health care provider about where to place the electrodes.  Make sure the monitor is safely clipped to your clothing or in a location close to your body as recommended by your health care provider.  If your monitor has a symptoms button, press the button to mark an event as soon as you feel a heart-related symptom, such as: ? Dizziness. ? Weakness. ? Light-headedness. ? Palpitations. ? Thumping or pounding in your chest. ? Shortness of breath. ? Unexplained weakness.  Keep a diary of your activities, such as walking, doing chores, and taking medicine. It is very important to note what you were doing when you pushed the button to record your symptoms. This will help your health care provider determine what might be contributing to your symptoms.  Send the recorded information as recommended by your health care provider. It may take some time for your health care provider to process the results.  Change the batteries as told by your health care provider.  Keep electronic devices away from your monitor. These include: ? Tablets. ? MP3 players. ? Cell phones.  While wearing your monitor you should avoid: ? Electric blankets. ? Armed forces operational officer. ? Electric toothbrushes. ? Microwave ovens. ? Magnets. ? Metal detectors. Get help right away if:  You have chest pain.  You have shortness of breath or extreme difficulty breathing.  You develop a very fast heartbeat that does not get better.  You develop dizziness that does not go away.  You faint or constantly feel like you are about to faint. Summary  An ambulatory cardiac monitor is a small recording device that is used to detect abnormal heart rhythms (arrhythmias).  Make sure you understand how to send the information from the monitor to your health care provider.  It is important to press the button on the monitor when you have any  heart-related symptoms.  Keep a diary of your activities, such as walking, doing chores, and taking medicine. It is very important to note what you were doing when you pushed the button to record your symptoms. This will help your health care provider learn what might be causing your symptoms. This information is not intended to replace advice  given to you by your health care provider. Make sure you discuss any questions you have with your health care provider. Document Revised: 11/04/2017 Document Reviewed: 11/06/2016 Elsevier Patient Education  2020 [place], [place], [place]  07/23/2020 9:58 AM    [facility] [place], [place],   [address] Phone: [phone]; Fax: [phone]

## 2020-07-29 MED FILL — SERTRALINE HCL 100 MG TABS: 100 | 90 days supply | Qty: 180 | Fill #1

## 2020-07-29 MED FILL — TRULICITY 0.75 MG/0.5 ML PE: 0.75 | 28 days supply | Qty: 2 | Fill #1

## 2020-07-29 MED FILL — RANOLAZINE ER 500 MG TABLET: 500 | 30 days supply | Qty: 60 | Fill #5

## 2020-07-29 MED FILL — ELIQUIS 5 MG TABLET: 5 | 30 days supply | Qty: 60 | Fill #2

## 2020-07-30 ENCOUNTER — Telehealth: Payer: Self-pay | Admitting: Interventional Cardiology

## 2020-07-30 ENCOUNTER — Encounter: Payer: Self-pay | Admitting: Interventional Cardiology

## 2020-07-30 ENCOUNTER — Encounter (INDEPENDENT_AMBULATORY_CARE_PROVIDER_SITE_OTHER): Payer: Medicare PPO

## 2020-07-30 DIAGNOSIS — R42 Dizziness and giddiness: Secondary | ICD-10-CM

## 2020-07-30 NOTE — Telephone Encounter (Signed)
Patient received his even monitor.  He is wondering if he can come into the office so someone can help him with it.

## 2020-07-30 NOTE — Telephone Encounter (Signed)
Patient has ? Home health coming out to put on monitor.  Instructed patient to follow instructions sent in my chart.  If the cannot apply at home patient will call back to schedule appointment to come into office to have cardiac event monitor applied.

## 2020-07-31 ENCOUNTER — Ambulatory Visit (INDEPENDENT_AMBULATORY_CARE_PROVIDER_SITE_OTHER): Payer: Medicare PPO | Admitting: Family Medicine

## 2020-07-31 ENCOUNTER — Other Ambulatory Visit: Payer: Self-pay

## 2020-07-31 ENCOUNTER — Encounter: Payer: Self-pay | Admitting: Family Medicine

## 2020-07-31 DIAGNOSIS — M7660 Achilles tendinitis, unspecified leg: Secondary | ICD-10-CM | POA: Diagnosis not present

## 2020-07-31 NOTE — Progress Notes (Signed)
[name] [name] - 74 y.o. male MRN [id number]  Date of birth: 07-30-1946  SUBJECTIVE:  Including CC & ROS.  Chief Complaint  Patient presents with  . Follow-up    left achilles    [name] [name] is a 74 y.o. male that is following up for his left Achilles pain.  He has been doing well with little to no pain.  Still gets swelling towards the end therefore he has been more active.  He has not started physical therapy as he was sick with a GI bug for period of time.  Symptoms are quite improved from his initial visit.   Review of Systems See HPI   HISTORY: Past Medical, Surgical, Social, and Family History Reviewed & Updated per EMR.   Pertinent Historical Findings include:  Past Medical History:  Diagnosis Date  . Acquired atrophy of thyroid 06/05/2013   Overview:  July 2014: controlled on synthroid 13mcg since 2012 May 2015: decreased to Synthroid 50 mcg  Last Assessment & Plan:  Pt doing well with most recent TSH within normal range.  Will continue current dosage of Synthroid 27mcg. Pt reminded to take medication on empty stomach. Will continue to monitor with periodic laboratory assessment in 3 months.   . Anemia    hemoglobin 7.4, iron deficiency, January, 2011, 2 unit transfusion, endoscopy normal, capsule endoscopy February, 2011 no small bowel abnormalities.   Most likely source gastric erosions, followed by GI  . Anxiety   . Bradycardia   . CAD (coronary artery disease)    A. CABG in 2000,status post cardiac cath in 2006, 2009 ....continued chest pain and SOB despite oral medication adjestments including Ranexa. B. Cath November 2009/ mRCA - 2.75 x 23 Abbott Xience V drug-eluting stent ...11/26/2008 to distal  RCA leading to acute marginal.  C. Cath 07/2012 for CP - stable anatomy, med rx. d. cath 2015 and 05/30/2015 stable anatomy, consider Myoview if has CP again  . Carotid artery disease ([place]) 08/01/2015   Doppler, May 29, 2015, 1-39% bilateral ICA   . Cerebral ischemia     MRI November, 2010, chronic microvascular ischemia  . CKD (chronic kidney disease), stage III   . Concussion   . Depression    Bipolar  . Diabetes mellitus ([place])   . Edema   . Essential hypertension 06/05/2013   Overview:  July 2014: Controlled with Bystolic Sep 8832: Changed to atenolol.  Last Assessment & Plan:  Will change to atenolol for cost and follow Improved.  Medication compliance strongly encouraged BP: 116/64 mmHg    Overview:  July 2014: Controlled with Bystolic Sep 5498: Changed to atenolol.  Last Assessment & Plan:  Will change to atenolol for cost and follow  . Falling episodes    these have occurred in the past and again recurring 2011  . Family history of adverse reaction to anesthesia    "mother died during bypass surgery but not sure if it has to do with anesthesia"  . Gastric ulcer   . GERD (gastroesophageal reflux disease)   . H/O medication noncompliance    Due to loss of insurance  . H/O multiple concussions   . Hard of hearing   . History of blood transfusion 12/20/2013  . Hx of CABG    2000,  / one median sternotomy suture broken her chest x-ray November, 2010, no clinical significance  . Hyperlipidemia   . Hypertension    pt. denies  . Iron deficiency anemia   . Long term (current)  use of anticoagulants [Z79.01] 07/20/2016  . Low back pain 06/12/2009   Qualifier: Diagnosis of  By: [name]   . Nephrolithiasis   . Orthostasis   . OSA (obstructive sleep apnea)   . PAF (paroxysmal atrial fibrillation) ([place])    a. dx 2017, started on amiodarone/warfarin but patient intermittently noncompliant.  Marland Kitchen PSVT (paroxysmal supraventricular tachycardia) ([place])   . RBBB 07/09/2009   Qualifier: Diagnosis of  By: [name], MD, [name] [name]   . RLS (restless legs syndrome) 09/19/2009   Qualifier: Diagnosis of  By: [name]   Overview:  July 2014: Controlled with Mirapex  Last Assessment & Plan:  Patient is doing well. Will continue current management and  follow clinically.  . Seizure disorder ([place]) 01/09/2017  . Spondylosis    C5-6, C6-7 MRI 2010  . Syncope 03/2016  . Thyroid disease   . Tubulovillous adenoma of colon 2007  . Vitamin D deficiency 05/17/2017  . Wears glasses     Past Surgical History:  Procedure Laterality Date  . ANTERIOR CERVICAL DECOMP/DISCECTOMY FUSION N/A 05/31/2016   Procedure: ANTERIOR CERVICAL DECOMPRESSION/DISCECTOMY FUSION CERVICAL FIVE-SIX,CERVICAL SIX-SEVEN;  Surgeon: [name], MD;  Location: [facility];  Service: Neurosurgery;  Laterality: N/A;  . CARDIAC CATHETERIZATION N/A 05/30/2015   Procedure: Left Heart Cath and Coronary Angiography;  Surgeon: [name], MD;  Location: [facility];  Service: Cardiovascular;  Laterality: N/A;  . COLONOSCOPY    . CORONARY ARTERY BYPASS GRAFT     2000  . ESOPHAGOGASTRODUODENOSCOPY    . LEFT HEART CATH AND CORS/GRAFTS ANGIOGRAPHY N/A 12/15/2017   Procedure: LEFT HEART CATH AND CORS/GRAFTS ANGIOGRAPHY;  Surgeon: [name], MD;  Location: [facility];  Service: Cardiovascular;  Laterality: N/A;  . LEFT HEART CATHETERIZATION WITH CORONARY/GRAFT ANGIOGRAM N/A 08/01/2012   Procedure: LEFT HEART CATHETERIZATION WITH Beatrix Fetters;  Surgeon: [name], MD;  Location: [facility];  Service: Cardiovascular;  Laterality: N/A;  . LEFT HEART CATHETERIZATION WITH CORONARY/GRAFT ANGIOGRAM N/A 01/03/2015   Procedure: LEFT HEART CATHETERIZATION WITH Beatrix Fetters;  Surgeon: [name], MD;  Location: [facility] CATH LAB;  Service: Cardiovascular;  Laterality: N/A;  . NASAL SEPTUM SURGERY     UP3  . PERCUTANEOUS CORONARY STENT INTERVENTION (PCI-S)  10/2008   mRCA PCI  2.75 x 23 Abbott Xience V drug-eluting stent   . RIGHT/LEFT HEART CATH AND CORONARY/GRAFT ANGIOGRAPHY N/A 01/07/2020   Procedure: RIGHT/LEFT HEART CATH AND CORONARY/GRAFT ANGIOGRAPHY;  Surgeon: [name], MD;  Location: [place] [facility];  Service: Cardiovascular;   Laterality: N/A;  . ULTRASOUND GUIDANCE FOR VASCULAR ACCESS  12/15/2017   Procedure: Ultrasound Guidance For Vascular Access;  Surgeon: [name], MD;  Location: [place] [facility];  Service: Cardiovascular;;    Family History  Problem Relation Age of Onset  . Pancreatic cancer Brother   . Diabetes Brother   . Coronary artery disease Brother   . Stroke Brother   . Diabetes Brother   . Diabetes Mother   . Heart failure Mother   . Heart failure Father   . Hypothyroidism Brother   . Coronary artery disease Brother   . Other Brother        colon surgery  . Heart attack Other        Nephew  . Irregular heart beat Daughter   . Cancer Maternal Grandmother        unknown   . Colon cancer  Neg Hx   . Stomach cancer Neg Hx   . Liver cancer Neg Hx   . Rectal cancer Neg Hx   . Esophageal cancer Neg Hx     Social History   Socioeconomic History  . Marital status: Married    Spouse name: Not on file  . Number of children: 4  . Years of education: 106  . Highest education level: Not on file  Occupational History  . Occupation: retired  Tobacco Use  . Smoking status: Never Smoker  . Smokeless tobacco: Never Used  Vaping Use  . Vaping Use: Never used  Substance and Sexual Activity  . Alcohol use: No    Alcohol/week: 0.0 standard drinks    Comment: stopped drinking in 1998  . Drug use: No  . Sexual activity: Not on file  Other Topics Concern  . Not on file  Social History Narrative   Patient is right handed.   Patient does not drink caffeine.   Social Determinants of Health   Financial Resource Strain:   . Difficulty of Paying Living Expenses: Not on file  Food Insecurity:   . Worried About Charity fundraiser in the Last Year: Not on file  . Ran Out of Food in the Last Year: Not on file  Transportation Needs:   . Lack of Transportation (Medical): Not on file  . Lack of Transportation (Non-Medical): Not on file  Physical Activity:   . Days of Exercise per  Week: Not on file  . Minutes of Exercise per Session: Not on file  Stress:   . Feeling of Stress : Not on file  Social Connections:   . Frequency of Communication with Friends and Family: Not on file  . Frequency of Social Gatherings with Friends and Family: Not on file  . Attends Religious Services: Not on file  . Active Member of Clubs or Organizations: Not on file  . Attends Archivist Meetings: Not on file  . Marital Status: Not on file  Intimate Partner Violence:   . Fear of Current or Ex-Partner: Not on file  . Emotionally Abused: Not on file  . Physically Abused: Not on file  . Sexually Abused: Not on file     PHYSICAL EXAM:  VS: BP 131/73   Pulse 65   Ht 5\' 9"  (1.753 m)   Wt 206 lb (93.4 kg)   BMI 30.42 kg/m  Physical Exam Gen: NAD, alert, cooperative with exam, well-appearing MSK:  Left Achilles: No swelling or ecchymosis. Normal strength resistance. Plantarflexion with [place] Silos test. No tenderness to palpation of the Achilles or at the insertion. Neurovascular intact     ASSESSMENT & PLAN:   Achilles tendon pain Continues to have improvement.  Has not yet started physical therapy.  Pain is intermittent and mild. -Counseled on home exercise therapy and supportive care. -Pennsaid samples. -He is going to start physical therapy now that he is feeling better. -Counseled on discontinuing the cam walker. -Follow-up in 4 weeks.

## 2020-07-31 NOTE — Assessment & Plan Note (Signed)
Continues to have improvement.  Has not yet started physical therapy.  Pain is intermittent and mild. -Counseled on home exercise therapy and supportive care. -Pennsaid samples. -He is going to start physical therapy now that he is feeling better. -Counseled on discontinuing the cam walker. -Follow-up in 4 weeks.

## 2020-07-31 NOTE — Progress Notes (Signed)
Medication Samples have been provided to the patient.  Drug name: Pennsaid       Strength: 2%        Qty: 2 boxes  LOT: [id number]  Exp.Date: 02/2021  Dosing instructions: use a pea size amount and rub gently.  The patient has been instructed regarding the correct time, dose, and frequency of taking this medication, including desired effects and most common side effects.   Sherrie George, MA 9:45 AM 07/31/2020

## 2020-07-31 NOTE — Patient Instructions (Signed)
Good to see you Please continue the exercises  Use ice as needed  Please try to stop using the boot  Please try physical therapy   Please send me a message in MyChart with any questions or updates.  Please see me back in 6-8 weeks.   --Dr. Raeford Razor

## 2020-08-08 ENCOUNTER — Other Ambulatory Visit: Payer: Self-pay | Admitting: Family Medicine

## 2020-08-08 MED FILL — NOVOLOG FLEXPEN SYRINGE: 100 | 7 days supply | Qty: 15 | Fill #15

## 2020-08-08 MED FILL — FUROSEMIDE 20 MG TAB: 20 | 30 days supply | Qty: 30 | Fill #3

## 2020-08-08 MED FILL — tiZANidine HCL 4 MG TABS: 4 | 10 days supply | Qty: 40 | Fill #0

## 2020-08-13 ENCOUNTER — Telehealth: Payer: Self-pay | Admitting: *Deleted

## 2020-08-13 NOTE — Telephone Encounter (Signed)
[employer] faxed over a critical monitor recording on this pt from today 08/13/20 at 0901 CST. Recording was auto-triggered showing pt in sustained afib, and HR- 90 bpm.  Pt does have a history of afib, and is taking Eliquis 5 mg po bid. Called the pt to inquire if he was having any symptoms during this time.  Pt states he had intermittent dizziness during this time, but no chest pain, sob, doe, N/V, diaphoresis,  pre-syncopal or syncopal episodes.  Pt states he had also taken his diabetes medication at that time, which causes him to feel dizzy at times, based on his blood sugar and how much of his sliding scale insulin he has to utilize.  Pt states he is asymptomatic at this current time, and resting comfortably at home. Pt confirms he is taking all his cardiac meds as prescribed.  Informed the pt that I will go and show his recording to our DOD Dr. Rayann Heman, to further review and interpret.  Informed the pt that if Dr. Rayann Heman suggest any further changes, I will call him shortly thereafter.  Pt verbalized understanding and agrees with this plan. Below is assessment and plan from [facility], and why he ordered pts event monitor for him:  ASSESSMENT & PLAN:    1. Coronary artery disease with unstable angina -Seems patient is dealing with microvascular angina.  Will increase Imdur to 90 mg daily.  Not on aspirin due to need of anticoagulation.  Continue Norvasc, Toprol-XL and Ranexa at current dose.  2.  Paroxysmal atrial fibrillation with palpitations/dizziness -Patient reports intermittent dizziness with palpitation.  He is very symptomatic with this.  Will get event monitor for further assessment.  Continue Eliquis for anticoagulation.  3.  Chronic diastolic heart failure -Volume status stable.  Continue Lasix and Toprol.  4.  Hypertension -Blood pressure stable on current medications

## 2020-08-13 NOTE — Telephone Encounter (Signed)
Monitor recording showed to DOD Dr. Rayann Heman.  Pt is was in afib.  Per Dr. Rayann Heman, no changes to be made, pt should continue his current regimen, and continue to monitor.  Will send this message to [facility] and [name], as a general FYI. Signed monitor placed in med rec box to be scanned.

## 2020-08-14 ENCOUNTER — Telehealth: Payer: Self-pay

## 2020-08-14 ENCOUNTER — Ambulatory Visit (INDEPENDENT_AMBULATORY_CARE_PROVIDER_SITE_OTHER): Payer: Medicare PPO | Admitting: Family Medicine

## 2020-08-14 ENCOUNTER — Other Ambulatory Visit: Payer: Self-pay

## 2020-08-14 ENCOUNTER — Ambulatory Visit (INDEPENDENT_AMBULATORY_CARE_PROVIDER_SITE_OTHER): Payer: Medicare PPO | Admitting: Endocrinology

## 2020-08-14 ENCOUNTER — Encounter: Payer: Self-pay | Admitting: Endocrinology

## 2020-08-14 ENCOUNTER — Other Ambulatory Visit: Payer: Self-pay | Admitting: Endocrinology

## 2020-08-14 VITALS — BP 129/68 | HR 72 | Temp 97.0°F | Resp 14 | Ht 69.0 in | Wt 212.6 lb

## 2020-08-14 VITALS — BP 124/72 | HR 78 | Ht 69.0 in | Wt 212.0 lb

## 2020-08-14 DIAGNOSIS — E1165 Type 2 diabetes mellitus with hyperglycemia: Secondary | ICD-10-CM

## 2020-08-14 DIAGNOSIS — I4821 Permanent atrial fibrillation: Secondary | ICD-10-CM

## 2020-08-14 DIAGNOSIS — E785 Hyperlipidemia, unspecified: Secondary | ICD-10-CM

## 2020-08-14 DIAGNOSIS — E559 Vitamin D deficiency, unspecified: Secondary | ICD-10-CM | POA: Diagnosis not present

## 2020-08-14 DIAGNOSIS — I10 Essential (primary) hypertension: Secondary | ICD-10-CM | POA: Diagnosis not present

## 2020-08-14 DIAGNOSIS — I5032 Chronic diastolic (congestive) heart failure: Secondary | ICD-10-CM

## 2020-08-14 DIAGNOSIS — F32A Depression, unspecified: Secondary | ICD-10-CM

## 2020-08-14 DIAGNOSIS — F329 Major depressive disorder, single episode, unspecified: Secondary | ICD-10-CM | POA: Diagnosis not present

## 2020-08-14 DIAGNOSIS — N184 Chronic kidney disease, stage 4 (severe): Secondary | ICD-10-CM | POA: Diagnosis not present

## 2020-08-14 DIAGNOSIS — E782 Mixed hyperlipidemia: Secondary | ICD-10-CM | POA: Diagnosis not present

## 2020-08-14 LAB — POCT GLYCOSYLATED HEMOGLOBIN (HGB A1C): Hemoglobin A1C: 7.2 % — AB (ref 4.0–5.6)

## 2020-08-14 MED ORDER — NOVOLOG FLEXPEN 100 UNIT/ML ~~LOC~~ SOPN: PEN_INJECTOR | SUBCUTANEOUS | Status: DC

## 2020-08-14 MED ORDER — INSULIN NPH (HUMAN) (ISOPHANE) 100 UNIT/ML ~~LOC~~ SUSP: 40.0000 [IU] | Freq: Every day | SUBCUTANEOUS | 11 refills | Status: DC

## 2020-08-14 MED FILL — NovoLIN N 100 UNIT/ML SUSP: 100 | 75 days supply | Qty: 30 | Fill #0

## 2020-08-14 NOTE — Telephone Encounter (Addendum)
Received monitor report for pt auto triggered event 08/13/2020 at 9:00 54am and 9:01 04am CST AFIB with CVR.  Pt reports some dizziness with mild CP and left arm pain, rated as a "4" on pain scale.  Pt reports he sat down and rested and pain finally resolved after lunch. Pt states he is feeling "fine today."  Report taken to DOD, Dr Lovena Le who recommends pt follow up with Dr Irish Lack.  Spoke with Dr Hassell Done RN, June Leap. And advised of Dr Tanna Furry recommendation.  Pt's same monitor alert was also addressed yesterday 08/13/2020 by Dr Rayann Heman.  See encounter for full details.  Monitor alert placed in basket for medical records to scan.

## 2020-08-14 NOTE — Patient Instructions (Signed)
High Cholesterol  High cholesterol is a condition in which the blood has high levels of a white, waxy, fat-like substance (cholesterol). The human body needs small amounts of cholesterol. The liver makes all the cholesterol that the body needs. Extra (excess) cholesterol comes from the food that we eat. Cholesterol is carried from the liver by the blood through the blood vessels. If you have high cholesterol, deposits (plaques) may build up on the walls of your blood vessels (arteries). Plaques make the arteries narrower and stiffer. Cholesterol plaques increase your risk for heart attack and stroke. Work with your health care provider to keep your cholesterol levels in a healthy range. What increases the risk? This condition is more likely to develop in people who:  Eat foods that are high in animal fat (saturated fat) or cholesterol.  Are overweight.  Are not getting enough exercise.  Have a family history of high cholesterol. What are the signs or symptoms? There are no symptoms of this condition. How is this diagnosed? This condition may be diagnosed from the results of a blood test.  If you are older than age 20, your health care provider may check your cholesterol every 4-6 years.  You may be checked more often if you already have high cholesterol or other risk factors for heart disease. The blood test for cholesterol measures:  "Bad" cholesterol (LDL cholesterol). This is the main type of cholesterol that causes heart disease. The desired level for LDL is less than 100.  "Good" cholesterol (HDL cholesterol). This type helps to protect against heart disease by cleaning the arteries and carrying the LDL away. The desired level for HDL is 60 or higher.  Triglycerides. These are fats that the body can store or burn for energy. The desired number for triglycerides is lower than 150.  Total cholesterol. This is a measure of the total amount of cholesterol in your blood, including LDL  cholesterol, HDL cholesterol, and triglycerides. A healthy number is less than 200. How is this treated? This condition is treated with diet changes, lifestyle changes, and medicines. Diet changes  This may include eating more whole grains, fruits, vegetables, nuts, and fish.  This may also include cutting back on red meat and foods that have a lot of added sugar. Lifestyle changes  Changes may include getting at least 40 minutes of aerobic exercise 3 times a week. Aerobic exercises include walking, biking, and swimming. Aerobic exercise along with a healthy diet can help you maintain a healthy weight.  Changes may also include quitting smoking. Medicines  Medicines are usually given if diet and lifestyle changes have failed to reduce your cholesterol to healthy levels.  Your health care provider may prescribe a statin medicine. Statin medicines have been shown to reduce cholesterol, which can reduce the risk of heart disease. Follow these instructions at home: Eating and drinking If told by your health care provider:  Eat chicken (without skin), fish, veal, shellfish, ground turkey breast, and round or loin cuts of red meat.  Do not eat fried foods or fatty meats, such as hot dogs and salami.  Eat plenty of fruits, such as apples.  Eat plenty of vegetables, such as broccoli, potatoes, and carrots.  Eat beans, peas, and lentils.  Eat grains such as barley, rice, couscous, and bulgur wheat.  Eat pasta without cream sauces.  Use skim or nonfat milk, and eat low-fat or nonfat yogurt and cheeses.  Do not eat or drink whole milk, cream, ice cream, egg yolks,   or hard cheeses.  Do not eat stick margarine or tub margarines that contain trans fats (also called partially hydrogenated oils).  Do not eat saturated tropical oils, such as coconut oil and palm oil.  Do not eat cakes, cookies, crackers, or other baked goods that contain trans fats.  General instructions  Exercise as  directed by your health care provider. Increase your activity level with activities such as gardening, walking, and taking the stairs.  Take over-the-counter and prescription medicines only as told by your health care provider.  Do not use any products that contain nicotine or tobacco, such as cigarettes and e-cigarettes. If you need help quitting, ask your health care provider.  Keep all follow-up visits as told by your health care provider. This is important. Contact a health care provider if:  You are struggling to maintain a healthy diet or weight.  You need help to start on an exercise program.  You need help to stop smoking. Get help right away if:  You have chest pain.  You have trouble breathing. This information is not intended to replace advice given to you by your health care provider. Make sure you discuss any questions you have with your health care provider. Document Revised: 11/25/2017 Document Reviewed: 05/22/2016 Elsevier Patient Education  2020 [employer] Inc[employer]  

## 2020-08-14 NOTE — Patient Instructions (Addendum)
check your blood sugar twice a day.  vary the time of day when you check, between before the 3 meals, and at bedtime.  also check if you have symptoms of your blood sugar being too high or too low.  please keep a record of the readings and bring it to your next appointment here (or you can bring the meter itself).  You can write it on any piece of paper.  please call us sooner if your blood sugar goes below 70, or if you have a lot of readings over 200. Please reduce the insulins to the numbers listed below, and: Please continue the same Trulicity.   Please come back for a follow-up appointment in 2 months.

## 2020-08-14 NOTE — Progress Notes (Signed)
Subjective:    Patient ID: [name] [name], male    DOB: Apr 14, 1946, 74 y.o.   MRN: [id number]  HPI Pt returns for f/u of diabetes mellitus:  DM type: Insulin-requiring type 2.  Dx'ed: 0932 Complications: PN, stage 3b CRI, CAD and PAD.  Therapy: insulin since 6712, and Trulicity DKA: never Severe hypoglycemia: never Pancreatitis: never Pancreatic imaging: 2017 CT showed fatty replacement of the pancreatic head and body.  SDOH: he takes human NPH insulin at HS, due to cost.   Other: he takes multiple daily injections.   Interval history: he is not using continuous glucose monitor.  no cbg record, but states cbg varies from 49-400.  He says he never misses the insulin, but he has reduced all insulin doses by half.   Past Medical History:  Diagnosis Date  . Acquired atrophy of thyroid 06/05/2013   Overview:  July 2014: controlled on synthroid 49mcg since 2012 May 2015: decreased to Synthroid 50 mcg  Last Assessment & Plan:  Pt doing well with most recent TSH within normal range.  Will continue current dosage of Synthroid 8mcg. Pt reminded to take medication on empty stomach. Will continue to monitor with periodic laboratory assessment in 3 months.   . Anemia    hemoglobin 7.4, iron deficiency, January, 2011, 2 unit transfusion, endoscopy normal, capsule endoscopy February, 2011 no small bowel abnormalities.   Most likely source gastric erosions, followed by GI  . Anxiety   . Bradycardia   . CAD (coronary artery disease)    A. CABG in 2000,status post cardiac cath in 2006, 2009 ....continued chest pain and SOB despite oral medication adjestments including Ranexa. B. Cath November 2009/ mRCA - 2.75 x 23 Abbott Xience V drug-eluting stent ...11/26/2008 to distal  RCA leading to acute marginal.  C. Cath 07/2012 for CP - stable anatomy, med rx. d. cath 2015 and 05/30/2015 stable anatomy, consider Myoview if has CP again  . Carotid artery disease ([place]) 08/01/2015   Doppler, May 29, 2015, 1-39%  bilateral ICA   . Cerebral ischemia    MRI November, 2010, chronic microvascular ischemia  . CKD (chronic kidney disease), stage III   . Concussion   . Depression    Bipolar  . Diabetes mellitus ([name])   . Edema   . Essential hypertension 06/05/2013   Overview:  July 2014: Controlled with Bystolic Sep 4580: Changed to atenolol.  Last Assessment & Plan:  Will change to atenolol for cost and follow Improved.  Medication compliance strongly encouraged BP: 116/64 mmHg    Overview:  July 2014: Controlled with Bystolic Sep 9983: Changed to atenolol.  Last Assessment & Plan:  Will change to atenolol for cost and follow  . Falling episodes    these have occurred in the past and again recurring 2011  . Family history of adverse reaction to anesthesia    "mother died during bypass surgery but not sure if it has to do with anesthesia"  . Gastric ulcer   . GERD (gastroesophageal reflux disease)   . H/O medication noncompliance    Due to loss of insurance  . H/O multiple concussions   . Hard of hearing   . History of blood transfusion 12/20/2013  . Hx of CABG    2000,  / one median sternotomy suture broken her chest x-ray November, 2010, no clinical significance  . Hyperlipidemia   . Hypertension    pt. denies  . Iron deficiency anemia   . Long term (current) use of  anticoagulants [Z79.01] 07/20/2016  . Low back pain 06/12/2009   Qualifier: Diagnosis of  By: [name]   . Nephrolithiasis   . Orthostasis   . OSA (obstructive sleep apnea)   . PAF (paroxysmal atrial fibrillation) ([place])    a. dx 2017, started on amiodarone/warfarin but patient intermittently noncompliant.  Marland Kitchen PSVT (paroxysmal supraventricular tachycardia) ([place])   . RBBB 07/09/2009   Qualifier: Diagnosis of  By: [name], MD, [name] [name]   . RLS (restless legs syndrome) 09/19/2009   Qualifier: Diagnosis of  By: [name]   Overview:  July 2014: Controlled with Mirapex  Last Assessment & Plan:  Patient is doing well.  Will continue current management and follow clinically.  . Seizure disorder ([place]) 01/09/2017  . Spondylosis    C5-6, C6-7 MRI 2010  . Syncope 03/2016  . Thyroid disease   . Tubulovillous adenoma of colon 2007  . Vitamin D deficiency 05/17/2017  . Wears glasses     Past Surgical History:  Procedure Laterality Date  . ANTERIOR CERVICAL DECOMP/DISCECTOMY FUSION N/A 05/31/2016   Procedure: ANTERIOR CERVICAL DECOMPRESSION/DISCECTOMY FUSION CERVICAL FIVE-SIX,CERVICAL SIX-SEVEN;  Surgeon: [name], MD;  Location: [facility];  Service: Neurosurgery;  Laterality: N/A;  . CARDIAC CATHETERIZATION N/A 05/30/2015   Procedure: Left Heart Cath and Coronary Angiography;  Surgeon: [name], MD;  Location: [facility];  Service: Cardiovascular;  Laterality: N/A;  . COLONOSCOPY    . CORONARY ARTERY BYPASS GRAFT     2000  . ESOPHAGOGASTRODUODENOSCOPY    . LEFT HEART CATH AND CORS/GRAFTS ANGIOGRAPHY N/A 12/15/2017   Procedure: LEFT HEART CATH AND CORS/GRAFTS ANGIOGRAPHY;  Surgeon: [name], MD;  Location: [facility];  Service: Cardiovascular;  Laterality: N/A;  . LEFT HEART CATHETERIZATION WITH CORONARY/GRAFT ANGIOGRAM N/A 08/01/2012   Procedure: LEFT HEART CATHETERIZATION WITH Beatrix Fetters;  Surgeon: [name], MD;  Location: [facility] C[facility] LAB;  Service: Cardiovascular;  Laterality: N/A;  . LEFT HEART CATHETERIZATION WITH CORONARY/GRAFT ANGIOGRAM N/A 01/03/2015   Procedure: LEFT HEART CATHETERIZATION WITH Beatrix Fetters;  Surgeon: [name], MD;  Location: [facility] CATH LAB;  Service: Cardiovascular;  Laterality: N/A;  . NASAL SEPTUM SURGERY     UP3  . PERCUTANEOUS CORONARY STENT INTERVENTION (PCI-S)  10/2008   mRCA PCI  2.75 x 23 Abbott Xience V drug-eluting stent   . RIGHT/LEFT HEART CATH AND CORONARY/GRAFT ANGIOGRAPHY N/A 01/07/2020   Procedure: RIGHT/LEFT HEART CATH AND CORONARY/GRAFT ANGIOGRAPHY;  Surgeon: [name], MD;  Location: [facility]  CV LAB;  Service: Cardiovascular;  Laterality: N/A;  . ULTRASOUND GUIDANCE FOR VASCULAR ACCESS  12/15/2017   Procedure: Ultrasound Guidance For Vascular Access;  Surgeon: [name], MD;  Location: [facility];  Service: Cardiovascular;;    Social History   Socioeconomic History  . Marital status: Married    Spouse name: Not on file  . Number of children: 4  . Years of education: 58  . Highest education level: Not on file  Occupational History  . Occupation: retired  Tobacco Use  . Smoking status: Never Smoker  . Smokeless tobacco: Never Used  Vaping Use  . Vaping Use: Never used  Substance and Sexual Activity  . Alcohol use: No    Alcohol/week: 0.0 standard drinks    Comment: stopped drinking in 1998  . Drug use: No  . Sexual activity: Not on file  Other Topics Concern  . Not on file  Social  History Narrative   Patient is right handed.   Patient does not drink caffeine.   Social Determinants of Health   Financial Resource Strain:   . Difficulty of Paying Living Expenses: Not on file  Food Insecurity:   . Worried About Charity fundraiser in the Last Year: Not on file  . Ran Out of Food in the Last Year: Not on file  Transportation Needs:   . Lack of Transportation (Medical): Not on file  . Lack of Transportation (Non-Medical): Not on file  Physical Activity:   . Days of Exercise per Week: Not on file  . Minutes of Exercise per Session: Not on file  Stress:   . Feeling of Stress : Not on file  Social Connections:   . Frequency of Communication with Friends and Family: Not on file  . Frequency of Social Gatherings with Friends and Family: Not on file  . Attends Religious Services: Not on file  . Active Member of Clubs or Organizations: Not on file  . Attends Archivist Meetings: Not on file  . Marital Status: Not on file  Intimate Partner Violence:   . Fear of Current or Ex-Partner: Not on file  . Emotionally Abused: Not on file  .  Physically Abused: Not on file  . Sexually Abused: Not on file    Current Outpatient Medications on File Prior to Visit  Medication Sig Dispense Refill  . albuterol (VENTOLIN HFA) 108 (90 Base) MCG/ACT inhaler Inhale 2 puffs into the lungs every 6 (six) hours as needed for wheezing or shortness of breath. 18 g 2  . amLODipine (NORVASC) 5 MG tablet TAKE 1 TABLET (5 MG TOTAL) BY MOUTH DAILY. 90 tablet 2  . atorvastatin (LIPITOR) 80 MG tablet TAKE 1 TABLET (80 MG TOTAL) BY MOUTH DAILY. 90 tablet 3  . Continuous Blood Gluc Receiver (FREESTYLE LIBRE 14 DAY READER) DEVI For continuous Blood Glucose monitoring 1 each 0  . Continuous Blood Gluc Sensor (FREESTYLE LIBRE 14 DAY SENSOR) MISC 1 Device by Does not apply route every 14 (fourteen) days. 6 each 3  . divalproex (DEPAKOTE ER) 250 MG 24 hr tablet Take 3 tablets (750 mg total) by mouth at bedtime. 180 tablet 0  . Dulaglutide (TRULICITY) 6.33 HL/4.5GY SOPN Inject 0.5 mLs (0.75 mg total) into the skin once a week. 4 pen 11  . ELIQUIS 5 MG TABS tablet TAKE 1 TABLET (5 MG TOTAL) BY MOUTH 2 (TWO) TIMES DAILY. 60 tablet 5  . fenofibrate (TRICOR) 145 MG tablet TAKE 1 TABLET BY MOUTH ONCE DAILY 90 tablet 1  . furosemide (LASIX) 20 MG tablet TAKE 1 TABLET BY MOUTH DAILY AS NEEDED FOR EDEMA 30 tablet 3  . glucose blood (ACCU-CHEK AVIVA) test strip Use as directed to check blood sugar 4 times daily.  DX E11.9 200 each 6  . Insulin Pen Needle 31G X 8 MM MISC 1 each by Does not apply route 3 (three) times daily. E11.9 60 each 0  . INSULIN SYRINGE 1CC/29G 29G X 1/2" 1 ML MISC 1 each by Does not apply route daily. Use to inject insulin daily 30 each 0  . isosorbide mononitrate (IMDUR) 60 MG 24 hr tablet Take 1.5 tablets (90 mg total) by mouth daily. 135 tablet 3  . levothyroxine (SYNTHROID) 100 MCG tablet TAKE 1 TABLET (100 MCG TOTAL) BY MOUTH DAILY. 90 tablet 1  . metoprolol succinate (TOPROL-XL) 50 MG 24 hr tablet TAKE 1 AND 1/2 TABLETS EVERY DAY FOR A  TOTAL OF  75 MG 135 tablet 3  . nitroGLYCERIN (NITROSTAT) 0.4 MG SL tablet PLACE 1 TABLET UNDER THE TONGUE EVERY 5 MINUTES AS NEEDED FOR CHEST PAIN. 25 tablet 1  . pantoprazole (PROTONIX) 40 MG tablet Take 1 tablet (40 mg total) by mouth daily. 90 tablet 3  . pramipexole (MIRAPEX) 1 MG tablet Take 0.5-1 tablets (0.5-1 mg total) by mouth at bedtime. 90 tablet 0  . ranolazine (RANEXA) 500 MG 12 hr tablet TAKE 1 TABLET BY MOUTH TWICE DAILY 60 tablet 5  . rOPINIRole (REQUIP) 1 MG tablet TAKE 1 TABLET BY MOUTH AT BEDTIME 30 tablet 1  . sertraline (ZOLOFT) 100 MG tablet TAKE 1 TABLET (100 MG TOTAL) BY MOUTH 2 (TWO) TIMES DAILY. 180 tablet 1  . tiZANidine (ZANAFLEX) 4 MG tablet Take 1 tablet (4 mg total) by mouth every 6 (six) hours as needed for muscle spasms. 40 tablet 2  . Vitamin D, Ergocalciferol, (DRISDOL) 1.25 MG (50000 UNIT) CAPS capsule Take 1 capsule (50,000 Units total) by mouth every 7 (seven) days. 4 capsule 4   No current facility-administered medications on file prior to visit.    Allergies  Allergen Reactions  . Morphine Other (See Comments)    hallucinations  . Shellfish-Derived Products Itching    Family History  Problem Relation Age of Onset  . Pancreatic cancer Brother   . Diabetes Brother   . Coronary artery disease Brother   . Stroke Brother   . Diabetes Brother   . Diabetes Mother   . Heart failure Mother   . Heart failure Father   . Hypothyroidism Brother   . Coronary artery disease Brother   . Other Brother        colon surgery  . Heart attack Other        Nephew  . Irregular heart beat Daughter   . Cancer Maternal Grandmother        unknown   . Colon cancer Neg Hx   . Stomach cancer Neg Hx   . Liver cancer Neg Hx   . Rectal cancer Neg Hx   . Esophageal cancer Neg Hx     BP 124/72   Pulse 78   Ht 5\' 9"  (1.753 m)   Wt 212 lb (96.2 kg)   SpO2 98%   BMI 31.31 kg/m    Review of Systems He has nausea since on Trulicity    Objective:   Physical Exam VITAL  SIGNS:  See vs page GENERAL: no distress Pulses: dorsalis pedis intact bilat.   MSK: no deformity of the feet.   CV: no leg edema.   Skin:  no ulcer on the feet.  normal color and temp on the feet.  Neuro: sensation is intact to touch on the feet.   Lab Results  Component Value Date   HGBA1C 7.2 (A) 08/14/2020   Lab Results  Component Value Date   CREATININE 1.73 (H) 05/14/2020   BUN 21 05/14/2020   NA 141 05/14/2020   K 4.2 05/14/2020   CL 106 05/14/2020   CO2 27 05/14/2020      Assessment & Plan:  Nausea, due to Trulicity: we can't increase for now Insulin-requiring type 2 DM, with PAD Hypoglycemia, due to insulin: this limits aggressiveness of glycemic control  Patient Instructions  check your blood sugar twice a day.  vary the time of day when you check, between before the 3 meals, and at bedtime.  also check if you have symptoms of your blood  sugar being too high or too low.  please keep a record of the readings and bring it to your next appointment here (or you can bring the meter itself).  You can write it on any piece of paper.  please call us sooner if your blood sugar goes below 70, or if you have a lot of readings over 200. Please reduce the insulins to the numbers listed below, and: Please continue the same Trulicity.   Please come back for a follow-up appointment in 2 months.

## 2020-08-15 ENCOUNTER — Telehealth: Payer: Self-pay | Admitting: Family Medicine

## 2020-08-15 DIAGNOSIS — N289 Disorder of kidney and ureter, unspecified: Secondary | ICD-10-CM

## 2020-08-15 LAB — CBC
HCT: 38.7 % (ref 38.5–50.0)
Hemoglobin: 13.2 g/dL (ref 13.2–17.1)
MCH: 30.6 pg (ref 27.0–33.0)
MCHC: 34.1 g/dL (ref 32.0–36.0)
MCV: 89.8 fL (ref 80.0–100.0)
MPV: 11.5 fL (ref 7.5–12.5)
Platelets: 231 10*3/uL (ref 140–400)
RBC: 4.31 10*6/uL (ref 4.20–5.80)
RDW: 13.3 % (ref 11.0–15.0)
WBC: 7.3 10*3/uL (ref 3.8–10.8)

## 2020-08-15 LAB — LIPID PANEL
Cholesterol: 118 mg/dL (ref <200)
HDL: 32 mg/dL — ABNORMAL LOW (ref >=40)
LDL Cholesterol (Calc): 62 mg/dL (calc)
Non-HDL Cholesterol (Calc): 86 mg/dL (calc) (ref <130)
Total CHOL/HDL Ratio: 3.7 (calc) (ref <5.0)
Triglycerides: 160 mg/dL — ABNORMAL HIGH (ref <150)

## 2020-08-15 LAB — COMPREHENSIVE METABOLIC PANEL
AG Ratio: 1.6 (calc) (ref 1.0–2.5)
ALT: 17 U/L (ref 9–46)
AST: 17 U/L (ref 10–35)
Albumin: 4.5 g/dL (ref 3.6–5.1)
Alkaline phosphatase (APISO): 56 U/L (ref 35–144)
BUN/Creatinine Ratio: 12 (calc) (ref 6–22)
BUN: 22 mg/dL (ref 7–25)
CO2: 27 mmol/L (ref 20–32)
Calcium: 9.6 mg/dL (ref 8.6–10.3)
Chloride: 105 mmol/L (ref 98–110)
Creat: 1.81 mg/dL — ABNORMAL HIGH (ref 0.70–1.18)
Globulin: 2.8 g/dL (calc) (ref 1.9–3.7)
Glucose, Bld: 117 mg/dL — ABNORMAL HIGH (ref 65–99)
Potassium: 4 mmol/L (ref 3.5–5.3)
Sodium: 140 mmol/L (ref 135–146)
Total Bilirubin: 0.6 mg/dL (ref 0.2–1.2)
Total Protein: 7.3 g/dL (ref 6.1–8.1)

## 2020-08-15 LAB — VITAMIN D 25 HYDROXY (VIT D DEFICIENCY, FRACTURES): Vit D, 25-Hydroxy: 40 ng/mL (ref 30–100)

## 2020-08-15 LAB — TSH: TSH: 2.11 mIU/L (ref 0.40–4.50)

## 2020-08-15 NOTE — Telephone Encounter (Signed)
Future orders for cmp in place for patient.

## 2020-08-15 NOTE — Progress Notes (Signed)
Left message on machine for patient to call back for lab results.

## 2020-08-15 NOTE — Telephone Encounter (Signed)
Patient states he needs lab in one month. If correct please add order

## 2020-08-18 ENCOUNTER — Ambulatory Visit: Payer: Medicare PPO | Admitting: Neurology

## 2020-08-18 ENCOUNTER — Encounter: Payer: Self-pay | Admitting: Neurology

## 2020-08-18 ENCOUNTER — Telehealth: Payer: Self-pay | Admitting: Neurology

## 2020-08-18 NOTE — Assessment & Plan Note (Signed)
Improved with Sertraline no changes today

## 2020-08-18 NOTE — Assessment & Plan Note (Signed)
Supplement and monitor 

## 2020-08-18 NOTE — Progress Notes (Signed)
Patient ID: [name] [name], male   DOB: May 08, 1946, 74 y.o.   MRN: [id number]   Subjective:    Patient ID: [name] [name], male    DOB: 1946/11/22, 74 y.o.   MRN: [id number]  Chief Complaint  Patient presents with  . 3 month followup    HPI Patient is in today for follow up on chronic medical concerns. No recent febrile illness or hospitalizations. No polyuria or polydipsia. His blood sugars are much better and he is tolerating Trulicity. He denies any new acute concerns. Denies CP/palp/SOB/HA/congestion/fevers/GI or GU c/o. Taking meds as prescribed.  Past Medical History:  Diagnosis Date  . Acquired atrophy of thyroid 06/05/2013   Overview:  July 2014: controlled on synthroid 41mcg since 2012 May 2015: decreased to Synthroid 50 mcg  Last Assessment & Plan:  Pt doing well with most recent TSH within normal range.  Will continue current dosage of Synthroid 96mcg. Pt reminded to take medication on empty stomach. Will continue to monitor with periodic laboratory assessment in 3 months.   . Anemia    hemoglobin 7.4, iron deficiency, January, 2011, 2 unit transfusion, endoscopy normal, capsule endoscopy February, 2011 no small bowel abnormalities.   Most likely source gastric erosions, followed by GI  . Anxiety   . Bradycardia   . CAD (coronary artery disease)    A. CABG in 2000,status post cardiac cath in 2006, 2009 ....continued chest pain and SOB despite oral medication adjestments including Ranexa. B. Cath November 2009/ mRCA - 2.75 x 23 Abbott Xience V drug-eluting stent ...11/26/2008 to distal  RCA leading to acute marginal.  C. Cath 07/2012 for CP - stable anatomy, med rx. d. cath 2015 and 05/30/2015 stable anatomy, consider Myoview if has CP again  . Carotid artery disease ([place]) 08/01/2015   Doppler, May 29, 2015, 1-39% bilateral ICA   . Cerebral ischemia    MRI November, 2010, chronic microvascular ischemia  . CKD (chronic kidney disease), stage III   . Concussion   . Depression     Bipolar  . Diabetes mellitus ([place])   . Edema   . Essential hypertension 06/05/2013   Overview:  July 2014: Controlled with Bystolic Sep 2536: Changed to atenolol.  Last Assessment & Plan:  Will change to atenolol for cost and follow Improved.  Medication compliance strongly encouraged BP: 116/64 mmHg    Overview:  July 2014: Controlled with Bystolic Sep 6440: Changed to atenolol.  Last Assessment & Plan:  Will change to atenolol for cost and follow  . Falling episodes    these have occurred in the past and again recurring 2011  . Family history of adverse reaction to anesthesia    "mother died during bypass surgery but not sure if it has to do with anesthesia"  . Gastric ulcer   . GERD (gastroesophageal reflux disease)   . H/O medication noncompliance    Due to loss of insurance  . H/O multiple concussions   . Hard of hearing   . History of blood transfusion 12/20/2013  . Hx of CABG    2000,  / one median sternotomy suture broken her chest x-ray November, 2010, no clinical significance  . Hyperlipidemia   . Hypertension    pt. denies  . Iron deficiency anemia   . Long term (current) use of anticoagulants [Z79.01] 07/20/2016  . Low back pain 06/12/2009   Qualifier: Diagnosis of  By: [name]   . Nephrolithiasis   . Orthostasis   . OSA (  obstructive sleep apnea)   . PAF (paroxysmal atrial fibrillation) ([place])    a. dx 2017, started on amiodarone/warfarin but patient intermittently noncompliant.  Marland Kitchen PSVT (paroxysmal supraventricular tachycardia) ([place])   . RBBB 07/09/2009   Qualifier: Diagnosis of  By: [name], MD, [name] [name]   . RLS (restless legs syndrome) 09/19/2009   Qualifier: Diagnosis of  By: [name]   Overview:  July 2014: Controlled with Mirapex  Last Assessment & Plan:  Patient is doing well. Will continue current management and follow clinically.  . Seizure disorder ([place]) 01/09/2017  . Spondylosis    C5-6, C6-7 MRI 2010  . Syncope 03/2016  . Thyroid disease     . Tubulovillous adenoma of colon 2007  . Vitamin D deficiency 05/17/2017  . Wears glasses     Past Surgical History:  Procedure Laterality Date  . ANTERIOR CERVICAL DECOMP/DISCECTOMY FUSION N/A 05/31/2016   Procedure: ANTERIOR CERVICAL DECOMPRESSION/DISCECTOMY FUSION CERVICAL FIVE-SIX,CERVICAL SIX-SEVEN;  Surgeon: [name], MD;  Location: [facility];  Service: Neurosurgery;  Laterality: N/A;  . CARDIAC CATHETERIZATION N/A 05/30/2015   Procedure: Left Heart Cath and Coronary Angiography;  Surgeon: [name], MD;  Location: [facility];  Service: Cardiovascular;  Laterality: N/A;  . COLONOSCOPY    . CORONARY ARTERY BYPASS GRAFT     2000  . ESOPHAGOGASTRODUODENOSCOPY    . LEFT HEART CATH AND CORS/GRAFTS ANGIOGRAPHY N/A 12/15/2017   Procedure: LEFT HEART CATH AND CORS/GRAFTS ANGIOGRAPHY;  Surgeon: [name], MD;  Location: [facility];  Service: Cardiovascular;  Laterality: N/A;  . LEFT HEART CATHETERIZATION WITH CORONARY/GRAFT ANGIOGRAM N/A 08/01/2012   Procedure: LEFT HEART CATHETERIZATION WITH Beatrix Fetters;  Surgeon: [name], MD;  Location[facility];  Service: Cardiovascular;  Laterality: N/A;  . LEFT HEART CATHETERIZATION WITH CORONARY/GRAFT ANGIOGRAM N/A 01/03/2015   Procedure: LEFT HEART CATHETERIZATION WITH Beatrix Fetters;  Surgeon: [name], MD;  Location[facility] LAB;  Service: Cardiovascular;  Laterality: N/A;  . NASAL SEPTUM SURGERY     UP3  . PERCUTANEOUS CORONARY STENT INTERVENTION (PCI-S)  10/2008   mRCA PCI  2.75 x 23 Abbott Xience V drug-eluting stent   . RIGHT/LEFT HEART CATH AND CORONARY/GRAFT ANGIOGRAPHY N/A 01/07/2020   Procedure: RIGHT/LEFT HEART CATH AND CORONARY/GRAFT ANGIOGRAPHY;  Surgeon: [name], MD;  Location: [facility];  Service: Cardiovascular;  Laterality: N/A;  . ULTRASOUND GUIDANCE FOR VASCULAR ACCESS  12/15/2017   Procedure: Ultrasound Guidance For Vascular Access;  Surgeon:  [name], MD;  Location: [facility];  Service: Cardiovascular;;    Family History  Problem Relation Age of Onset  . Pancreatic cancer Brother   . Diabetes Brother   . Coronary artery disease Brother   . Stroke Brother   . Diabetes Brother   . Diabetes Mother   . Heart failure Mother   . Heart failure Father   . Hypothyroidism Brother   . Coronary artery disease Brother   . Other Brother        colon surgery  . Heart attack Other        Nephew  . Irregular heart beat Daughter   . Cancer Maternal Grandmother        unknown   . Colon cancer Neg Hx   . Stomach cancer Neg Hx   . Liver cancer Neg Hx   . Rectal cancer Neg Hx   . Esophageal cancer Neg Hx  Social History   Socioeconomic History  . Marital status: Married    Spouse name: Not on file  . Number of children: 4  . Years of education: 62  . Highest education level: Not on file  Occupational History  . Occupation: retired  Tobacco Use  . Smoking status: Never Smoker  . Smokeless tobacco: Never Used  Vaping Use  . Vaping Use: Never used  Substance and Sexual Activity  . Alcohol use: No    Alcohol/week: 0.0 standard drinks    Comment: stopped drinking in 1998  . Drug use: No  . Sexual activity: Not on file  Other Topics Concern  . Not on file  Social History Narrative   Patient is right handed.   Patient does not drink caffeine.   Social Determinants of Health   Financial Resource Strain:   . Difficulty of Paying Living Expenses: Not on file  Food Insecurity:   . Worried About Charity fundraiser in the Last Year: Not on file  . Ran Out of Food in the Last Year: Not on file  Transportation Needs:   . Lack of Transportation (Medical): Not on file  . Lack of Transportation (Non-Medical): Not on file  Physical Activity:   . Days of Exercise per Week: Not on file  . Minutes of Exercise per Session: Not on file  Stress:   . Feeling of Stress : Not on file  Social Connections:     . Frequency of Communication with Friends and Family: Not on file  . Frequency of Social Gatherings with Friends and Family: Not on file  . Attends Religious Services: Not on file  . Active Member of Clubs or Organizations: Not on file  . Attends Archivist Meetings: Not on file  . Marital Status: Not on file  Intimate Partner Violence:   . Fear of Current or Ex-Partner: Not on file  . Emotionally Abused: Not on file  . Physically Abused: Not on file  . Sexually Abused: Not on file    Outpatient Medications Prior to Visit  Medication Sig Dispense Refill  . albuterol (VENTOLIN HFA) 108 (90 Base) MCG/ACT inhaler Inhale 2 puffs into the lungs every 6 (six) hours as needed for wheezing or shortness of breath. 18 g 2  . amLODipine (NORVASC) 5 MG tablet TAKE 1 TABLET (5 MG TOTAL) BY MOUTH DAILY. 90 tablet 2  . atorvastatin (LIPITOR) 80 MG tablet TAKE 1 TABLET (80 MG TOTAL) BY MOUTH DAILY. 90 tablet 3  . Continuous Blood Gluc Receiver (FREESTYLE LIBRE 14 DAY READER) DEVI For continuous Blood Glucose monitoring 1 each 0  . Continuous Blood Gluc Sensor (FREESTYLE LIBRE 14 DAY SENSOR) MISC 1 Device by Does not apply route every 14 (fourteen) days. 6 each 3  . divalproex (DEPAKOTE ER) 250 MG 24 hr tablet Take 3 tablets (750 mg total) by mouth at bedtime. 180 tablet 0  . Dulaglutide (TRULICITY) 1.44 RX/5.4MG SOPN Inject 0.5 mLs (0.75 mg total) into the skin once a week. 4 pen 11  . ELIQUIS 5 MG TABS tablet TAKE 1 TABLET (5 MG TOTAL) BY MOUTH 2 (TWO) TIMES DAILY. 60 tablet 5  . fenofibrate (TRICOR) 145 MG tablet TAKE 1 TABLET BY MOUTH ONCE DAILY 90 tablet 1  . furosemide (LASIX) 20 MG tablet TAKE 1 TABLET BY MOUTH DAILY AS NEEDED FOR EDEMA 30 tablet 3  . glucose blood (ACCU-CHEK AVIVA) test strip Use as directed to check blood sugar 4 times daily.  DX  E11.9 200 each 6  . insulin aspart (NOVOLOG FLEXPEN) 100 UNIT/ML FlexPen 3 times a day (just before each meal) 30-35-55 units    . insulin  NPH Human (NOVOLIN N) 100 UNIT/ML injection Inject 0.4 mLs (40 Units total) into the skin at bedtime. 30 mL 11  . Insulin Pen Needle 31G X 8 MM MISC 1 each by Does not apply route 3 (three) times daily. E11.9 60 each 0  . INSULIN SYRINGE 1CC/29G 29G X 1/2" 1 ML MISC 1 each by Does not apply route daily. Use to inject insulin daily 30 each 0  . isosorbide mononitrate (IMDUR) 60 MG 24 hr tablet Take 1.5 tablets (90 mg total) by mouth daily. 135 tablet 3  . levothyroxine (SYNTHROID) 100 MCG tablet TAKE 1 TABLET (100 MCG TOTAL) BY MOUTH DAILY. 90 tablet 1  . metoprolol succinate (TOPROL-XL) 50 MG 24 hr tablet TAKE 1 AND 1/2 TABLETS EVERY DAY FOR A TOTAL OF 75 MG 135 tablet 3  . nitroGLYCERIN (NITROSTAT) 0.4 MG SL tablet PLACE 1 TABLET UNDER THE TONGUE EVERY 5 MINUTES AS NEEDED FOR CHEST PAIN. 25 tablet 1  . pantoprazole (PROTONIX) 40 MG tablet Take 1 tablet (40 mg total) by mouth daily. 90 tablet 3  . pramipexole (MIRAPEX) 1 MG tablet Take 0.5-1 tablets (0.5-1 mg total) by mouth at bedtime. 90 tablet 0  . ranolazine (RANEXA) 500 MG 12 hr tablet TAKE 1 TABLET BY MOUTH TWICE DAILY 60 tablet 5  . rOPINIRole (REQUIP) 1 MG tablet TAKE 1 TABLET BY MOUTH AT BEDTIME 30 tablet 1  . sertraline (ZOLOFT) 100 MG tablet TAKE 1 TABLET (100 MG TOTAL) BY MOUTH 2 (TWO) TIMES DAILY. 180 tablet 1  . tiZANidine (ZANAFLEX) 4 MG tablet Take 1 tablet (4 mg total) by mouth every 6 (six) hours as needed for muscle spasms. 40 tablet 2  . Vitamin D, Ergocalciferol, (DRISDOL) 1.25 MG (50000 UNIT) CAPS capsule Take 1 capsule (50,000 Units total) by mouth every 7 (seven) days. 4 capsule 4   No facility-administered medications prior to visit.    Allergies  Allergen Reactions  . Morphine Other (See Comments)    hallucinations  . Shellfish-Derived Products Itching    Review of Systems  Constitutional: Positive for malaise/fatigue. Negative for fever.  HENT: Negative for congestion.   Eyes: Negative for blurred vision.   Respiratory: Negative for shortness of breath.   Cardiovascular: Negative for chest pain, palpitations and leg swelling.  Gastrointestinal: Negative for abdominal pain, blood in stool and nausea.  Genitourinary: Negative for dysuria and frequency.  Musculoskeletal: Negative for falls.  Skin: Negative for rash.  Neurological: Negative for dizziness, loss of consciousness and headaches.  Endo/Heme/Allergies: Negative for environmental allergies.  Psychiatric/Behavioral: Negative for depression. The patient is not nervous/anxious.        Objective:    Physical Exam Vitals and nursing note reviewed.  Constitutional:      General: He is not in acute distress.    Appearance: He is well-developed.  HENT:     Head: Normocephalic and atraumatic.     Nose: Nose normal.  Eyes:     General:        Right eye: No discharge.        Left eye: No discharge.  Cardiovascular:     Rate and Rhythm: Normal rate. Rhythm irregular.     Heart sounds: No murmur heard.   Pulmonary:     Effort: Pulmonary effort is normal.     Breath sounds: Normal  breath sounds.  Abdominal:     General: Bowel sounds are normal.     Palpations: Abdomen is soft.     Tenderness: There is no abdominal tenderness.  Musculoskeletal:     Cervical back: Normal range of motion and neck supple.  Skin:    General: Skin is warm and dry.  Neurological:     Mental Status: He is alert and oriented to person, place, and time.     BP 129/68 (BP Location: Left Arm, Patient Position: Sitting, Cuff Size: Large)   Pulse 72   Temp (!) 97 F (36.1 C) (Oral)   Resp 14   Ht 5\' 9"  (1.753 m)   Wt 212 lb 9.6 oz (96.4 kg)   SpO2 99%   BMI 31.40 kg/m  Wt Readings from Last 3 Encounters:  08/14/20 212 lb 9.6 oz (96.4 kg)  08/14/20 212 lb (96.2 kg)  07/31/20 206 lb (93.4 kg)    Diabetic Foot Exam - Simple   No data filed     Lab Results  Component Value Date   WBC 7.3 08/14/2020   HGB 13.2 08/14/2020   HCT 38.7  08/14/2020   PLT 231 08/14/2020   GLUCOSE 117 (H) 08/14/2020   CHOL 118 08/14/2020   TRIG 160 (H) 08/14/2020   HDL 32 (L) 08/14/2020   LDLDIRECT 77.0 08/20/2019   LDLCALC 62 08/14/2020   ALT 17 08/14/2020   AST 17 08/14/2020   NA 140 08/14/2020   K 4.0 08/14/2020   CL 105 08/14/2020   CREATININE 1.81 (H) 08/14/2020   BUN 22 08/14/2020   CO2 27 08/14/2020   TSH 2.11 08/14/2020   PSA 3.14 08/20/2019   INR 2.7 04/23/2019   HGBA1C 7.2 (A) 08/14/2020   MICROALBUR <0.7 05/14/2019    Lab Results  Component Value Date   TSH 2.11 08/14/2020   Lab Results  Component Value Date   WBC 7.3 08/14/2020   HGB 13.2 08/14/2020   HCT 38.7 08/14/2020   MCV 89.8 08/14/2020   PLT 231 08/14/2020   Lab Results  Component Value Date   NA 140 08/14/2020   K 4.0 08/14/2020   CO2 27 08/14/2020   GLUCOSE 117 (H) 08/14/2020   BUN 22 08/14/2020   CREATININE 1.81 (H) 08/14/2020   BILITOT 0.6 08/14/2020   ALKPHOS 71 05/14/2020   AST 17 08/14/2020   ALT 17 08/14/2020   PROT 7.3 08/14/2020   ALBUMIN 4.5 05/14/2020   CALCIUM 9.6 08/14/2020   ANIONGAP 8 01/08/2020   GFR 39.06 (L) 05/14/2020   Lab Results  Component Value Date   CHOL 118 08/14/2020   Lab Results  Component Value Date   HDL 32 (L) 08/14/2020   Lab Results  Component Value Date   LDLCALC 62 08/14/2020   Lab Results  Component Value Date   TRIG 160 (H) 08/14/2020   Lab Results  Component Value Date   CHOLHDL 3.7 08/14/2020   Lab Results  Component Value Date   HGBA1C 7.2 (A) 08/14/2020       Assessment & Plan:   Problem List Items Addressed This Visit    Diabetes mellitus type II, uncontrolled ([place]) - Primary (Chronic)    hgba1c acceptable, minimize simple carbs. Increase exercise as tolerated. Continue current meds. hgba1c greatly improved with addition of Trulicity. Following with endocrinology      Depression    Improved with Sertraline no changes today      Hyperlipidemia LDL goal <70   Relevant  Orders   Lipid panel (Completed)   Essential hypertension    Well controlled, no changes to meds. Encouraged heart healthy diet such as the DASH diet and exercise as tolerated.       Relevant Orders   CBC (Completed)   Comprehensive metabolic panel (Completed)   TSH (Completed)   HLD (hyperlipidemia)    Tolerating statin, encouraged heart healthy diet, avoid trans fats, minimize simple carbs and saturated fats. Increase exercise as tolerated      Chronic diastolic CHF (congestive heart failure) (HCC)    Stable, no exacerbation, no changes, continue to follow with cardiology      Atrial fibrillation ([place]) [I48.91]    Asymptomatic, tolerating Eliquis      Vitamin D deficiency    Supplement and monitor      Relevant Orders   VITAMIN D 25 Hydroxy (Vit-D Deficiency, Fractures) (Completed)   CKD (chronic kidney disease), stage IV ([place])    Hydrate and monitor         I am having Cedarius A. Lundin maintain his divalproex, glucose blood, albuterol, INSULIN SYRINGE 1CC/29G, FreeStyle Libre 14 Day Sensor, Insulin Pen Needle, metoprolol succinate, ranolazine, atorvastatin, sertraline, rOPINIRole, furosemide, fenofibrate, nitroGLYCERIN, Vitamin D (Ergocalciferol), Eliquis, pantoprazole, pramipexole, levothyroxine, FreeStyle Libre 14 Day Reader, Trulicity, amLODipine, isosorbide mononitrate, tiZANidine, insulin NPH Human, and NovoLOG FlexPen.  No orders of the defined types were placed in this encounter.    Penni Homans, MD

## 2020-08-18 NOTE — Assessment & Plan Note (Signed)
Hydrate and monitor 

## 2020-08-18 NOTE — Assessment & Plan Note (Signed)
hgba1c acceptable, minimize simple carbs. Increase exercise as tolerated. Continue current meds. hgba1c greatly improved with addition of Trulicity. Following with endocrinology

## 2020-08-18 NOTE — Assessment & Plan Note (Signed)
Tolerating statin, encouraged heart healthy diet, avoid trans fats, minimize simple carbs and saturated fats. Increase exercise as tolerated 

## 2020-08-18 NOTE — Assessment & Plan Note (Signed)
Well controlled, no changes to meds. Encouraged heart healthy diet such as the DASH diet and exercise as tolerated.  °

## 2020-08-18 NOTE — Assessment & Plan Note (Signed)
Stable, no exacerbation, no changes, continue to follow with cardiology 

## 2020-08-18 NOTE — Assessment & Plan Note (Addendum)
Asymptomatic, tolerating Eliquis 

## 2020-08-18 NOTE — Telephone Encounter (Signed)
The patient did not show for a revisit appointment today.

## 2020-08-19 ENCOUNTER — Other Ambulatory Visit: Payer: Self-pay

## 2020-08-19 ENCOUNTER — Other Ambulatory Visit: Payer: Medicare PPO

## 2020-08-19 DIAGNOSIS — Z20822 Contact with and (suspected) exposure to covid-19: Secondary | ICD-10-CM | POA: Diagnosis not present

## 2020-08-21 LAB — NOVEL CORONAVIRUS, NAA: SARS-CoV-2, NAA: NOT DETECTED

## 2020-08-21 LAB — SARS-COV-2, NAA 2 DAY TAT

## 2020-08-22 ENCOUNTER — Telehealth: Payer: Self-pay | Admitting: Family Medicine

## 2020-08-22 NOTE — Telephone Encounter (Signed)
Caller: Kenroy Call Back # [phone]  Patient states his wife tested positive with Covid 19. Patient states he was tested and test was negative.   Patient states he is having a lot of Covid 19 symptoms.  Fever, Chillis  Please Advise

## 2020-08-22 NOTE — Telephone Encounter (Signed)
Patient notified and will do as provider has instructed and will be retested either this Saturday or Sunday, and will call back with results.

## 2020-08-22 NOTE — Telephone Encounter (Signed)
If symptoms persist would have him retest in 2-3 days.   Take Multivitamin with minerals, selenium Vitamin D 1000-2000 IU daily Probiotic with lactobacillus and bifidophilus Asprin EC 81 mg daily

## 2020-08-26 ENCOUNTER — Ambulatory Visit: Payer: Medicare PPO | Admitting: Endocrinology

## 2020-08-27 ENCOUNTER — Other Ambulatory Visit: Payer: Self-pay | Admitting: Endocrinology

## 2020-08-27 ENCOUNTER — Other Ambulatory Visit: Payer: Self-pay | Admitting: Family Medicine

## 2020-08-27 DIAGNOSIS — Z20828 Contact with and (suspected) exposure to other viral communicable diseases: Secondary | ICD-10-CM | POA: Diagnosis not present

## 2020-08-27 DIAGNOSIS — E1165 Type 2 diabetes mellitus with hyperglycemia: Secondary | ICD-10-CM

## 2020-08-27 MED FILL — ULTCARE INS SYR 1 ML 31GX5/: 31G X 5/16" | 90 days supply | Qty: 100 | Fill #0

## 2020-08-27 MED FILL — VIT D2 1.25 MG (50,000 UNIT: 1.25 MG | 28 days supply | Qty: 4 | Fill #3

## 2020-08-27 MED FILL — ELIQUIS 5 MG TABLET: 5 | 30 days supply | Qty: 60 | Fill #3

## 2020-08-27 MED FILL — RANOLAZINE ER 500 MG TABLET: 500 | 30 days supply | Qty: 60 | Fill #0

## 2020-08-27 MED FILL — FENOFIBRATE 145 MG TABS: 145 | 90 days supply | Qty: 90 | Fill #1

## 2020-09-03 ENCOUNTER — Other Ambulatory Visit: Payer: Self-pay | Admitting: Family Medicine

## 2020-09-09 ENCOUNTER — Other Ambulatory Visit: Payer: Self-pay | Admitting: Endocrinology

## 2020-09-09 ENCOUNTER — Other Ambulatory Visit: Payer: Self-pay | Admitting: Family Medicine

## 2020-09-09 DIAGNOSIS — E1165 Type 2 diabetes mellitus with hyperglycemia: Secondary | ICD-10-CM

## 2020-09-09 MED FILL — NOVOLOG FLEXPEN SYRINGE: 100 | 7 days supply | Qty: 15 | Fill #0

## 2020-09-09 MED FILL — TRULICITY 0.75 MG/0.5 ML PE: 0.75 | 28 days supply | Qty: 2 | Fill #2

## 2020-09-09 MED FILL — FUROSEMIDE 20 MG TAB: 20 | 30 days supply | Qty: 30 | Fill #0

## 2020-09-09 MED FILL — ATORVASTATIN 80 MG TABLET: 80 | 90 days supply | Qty: 90 | Fill #2

## 2020-09-12 ENCOUNTER — Other Ambulatory Visit (HOSPITAL_BASED_OUTPATIENT_CLINIC_OR_DEPARTMENT_OTHER): Payer: Self-pay | Admitting: Internal Medicine

## 2020-09-12 ENCOUNTER — Ambulatory Visit: Payer: Medicare PPO | Attending: Internal Medicine

## 2020-09-12 ENCOUNTER — Other Ambulatory Visit: Payer: Self-pay

## 2020-09-12 DIAGNOSIS — Z23 Encounter for immunization: Secondary | ICD-10-CM

## 2020-09-12 MED FILL — FLUAD QUADRIVALENT 0.5 ML P: 0.5 | 1 days supply | Qty: 1 | Fill #0

## 2020-09-12 NOTE — Addendum Note (Signed)
Addended by: Kelle Darting A on: 09/12/2020 01:51 PM   Modules accepted: Orders

## 2020-09-12 NOTE — Progress Notes (Signed)
° °  Covid-19 Vaccination Clinic  Name:  LYALL FACIANE    MRN: [id number] DOB: [birth year]  09/12/2020  Mr. Lussier was observed post Covid-19 immunization for 15 minutes without incident. He was provided with Vaccine Information Sheet and instruction to access the V-Safe system. Vaccinated by [employer].  Mr. Walle was instructed to call 911 with any severe reactions post vaccine:  Difficulty breathing   Swelling of face and throat   A fast heartbeat   A bad rash all over body   Dizziness and weakness

## 2020-09-15 ENCOUNTER — Other Ambulatory Visit (INDEPENDENT_AMBULATORY_CARE_PROVIDER_SITE_OTHER): Payer: Medicare PPO

## 2020-09-15 ENCOUNTER — Other Ambulatory Visit: Payer: Self-pay

## 2020-09-15 DIAGNOSIS — N289 Disorder of kidney and ureter, unspecified: Secondary | ICD-10-CM

## 2020-09-15 LAB — COMPREHENSIVE METABOLIC PANEL
AG Ratio: 1.7 (calc) (ref 1.0–2.5)
ALT: 15 U/L (ref 9–46)
AST: 17 U/L (ref 10–35)
Albumin: 4.4 g/dL (ref 3.6–5.1)
Alkaline phosphatase (APISO): 60 U/L (ref 35–144)
BUN/Creatinine Ratio: 12 (calc) (ref 6–22)
BUN: 25 mg/dL (ref 7–25)
CO2: 27 mmol/L (ref 20–32)
Calcium: 9.5 mg/dL (ref 8.6–10.3)
Chloride: 97 mmol/L — ABNORMAL LOW (ref 98–110)
Creat: 2.02 mg/dL — ABNORMAL HIGH (ref 0.70–1.18)
Globulin: 2.6 g/dL (calc) (ref 1.9–3.7)
Glucose, Bld: 379 mg/dL — ABNORMAL HIGH (ref 65–99)
Potassium: 4.6 mmol/L (ref 3.5–5.3)
Sodium: 131 mmol/L — ABNORMAL LOW (ref 135–146)
Total Bilirubin: 0.8 mg/dL (ref 0.2–1.2)
Total Protein: 7 g/dL (ref 6.1–8.1)

## 2020-09-16 NOTE — Progress Notes (Signed)
Pt called given providers recommendations. Pt will schedule appt with endocrinology provider today and is looking forward to referral with nephrology. States since he got his covid injections his BG has been running higher

## 2020-09-17 ENCOUNTER — Ambulatory Visit (INDEPENDENT_AMBULATORY_CARE_PROVIDER_SITE_OTHER): Payer: Medicare PPO | Admitting: Endocrinology

## 2020-09-17 ENCOUNTER — Other Ambulatory Visit: Payer: Self-pay | Admitting: Endocrinology

## 2020-09-17 ENCOUNTER — Encounter: Payer: Self-pay | Admitting: Endocrinology

## 2020-09-17 ENCOUNTER — Other Ambulatory Visit: Payer: Self-pay

## 2020-09-17 VITALS — BP 140/76 | HR 72 | Ht 69.0 in | Wt 205.4 lb

## 2020-09-17 DIAGNOSIS — E1165 Type 2 diabetes mellitus with hyperglycemia: Secondary | ICD-10-CM

## 2020-09-17 LAB — POCT GLYCOSYLATED HEMOGLOBIN (HGB A1C): Hemoglobin A1C: 7.9 % — AB (ref 4.0–5.6)

## 2020-09-17 MED ORDER — TRULICITY 1.5 MG/0.5ML ~~LOC~~ SOAJ: 1.5000 mg | SUBCUTANEOUS | 3 refills | Status: DC

## 2020-09-17 MED FILL — TRULICITY 1.5 MG/0.5 ML PEN: 1.5 | 84 days supply | Qty: 6 | Fill #0

## 2020-09-17 NOTE — Patient Instructions (Addendum)
check your blood sugar twice a day.  vary the time of day when you check, between before the 3 meals, and at bedtime.  also check if you have symptoms of your blood sugar being too high or too low.  please keep a record of the readings and bring it to your next appointment here (or you can bring the meter itself).  You can write it on any piece of paper.  please call us sooner if your blood sugar goes below 70, or if you have a lot of readings over 200. Please continue the same insulins, and: I have sent a prescription to your pharmacy, to Trulicity to 1.5 mg per week.   Please come back for a follow-up appointment in 3 months.  Diabetic DME Suppliers   [email or web address] [phone] Fax [phone]  [email or web address] [phone] Fax [phone]  [email or web address] [phone] ext [phone] Fax [phone]  [email or web address] [phone] Fax [phone]  [email or web address] [phone] Fax [phone]  [email or web address].com [phone] Fax [phone]  [facility] [email or web address] [phone] Fax [phone]  [facility] [email or web address] [phone] Fax [email or web address] [phone] option #1 Fax [phone]  [email or web address] [phone] Fax [phone]  [place] [facility] [phone] Fax [phone]  [place] [email or web address] [phone] Fax [phone]

## 2020-09-17 NOTE — Progress Notes (Signed)
Subjective:    Patient ID: [name] [name], male    DOB: 02-Jun-1946, 74 y.o.   MRN: [id number]  HPI Pt returns for f/u of diabetes mellitus:  DM type: Insulin-requiring type 2.  Dx'ed: 5638 Complications: PN, stage 3b CRI, CAD and PAD.  Therapy: insulin since 7564, and Trulicity DKA: never Severe hypoglycemia: never Pancreatitis: never Pancreatic imaging: 2017 CT showed fatty replacement of the pancreatic head and body.  SDOH: he takes human NPH insulin at HS, due to cost.   Other: he takes multiple daily injections; he is not using continuous glucose monitor. Interval history:   no cbg record, but states cbg varies from 75-425.  There is no trend throughout the day.  He has increased insulin by 5 units per dose.  He recently had coronavirus, but did not receive steroid rx.  Past Medical History:  Diagnosis Date  . Acquired atrophy of thyroid 06/05/2013   Overview:  July 2014: controlled on synthroid 90mcg since 2012 May 2015: decreased to Synthroid 50 mcg  Last Assessment & Plan:  Pt doing well with most recent TSH within normal range.  Will continue current dosage of Synthroid 41mcg. Pt reminded to take medication on empty stomach. Will continue to monitor with periodic laboratory assessment in 3 months.   . Anemia    hemoglobin 7.4, iron deficiency, January, 2011, 2 unit transfusion, endoscopy normal, capsule endoscopy February, 2011 no small bowel abnormalities.   Most likely source gastric erosions, followed by GI  . Anxiety   . Bradycardia   . CAD (coronary artery disease)    A. CABG in 2000,status post cardiac cath in 2006, 2009 ....continued chest pain and SOB despite oral medication adjestments including Ranexa. B. Cath November 2009/ mRCA - 2.75 x 23 Abbott Xience V drug-eluting stent ...11/26/2008 to distal  RCA leading to acute marginal.  C. Cath 07/2012 for CP - stable anatomy, med rx. d. cath 2015 and 05/30/2015 stable anatomy, consider Myoview if has CP again  . Carotid  artery disease ([place]) 08/01/2015   Doppler, May 29, 2015, 1-39% bilateral ICA   . Cerebral ischemia    MRI November, 2010, chronic microvascular ischemia  . CKD (chronic kidney disease), stage III ([place])   . Concussion   . Depression    Bipolar  . Diabetes mellitus ([place])   . Edema   . Essential hypertension 06/05/2013   Overview:  July 2014: Controlled with Bystolic Sep 3329: Changed to atenolol.  Last Assessment & Plan:  Will change to atenolol for cost and follow Improved.  Medication compliance strongly encouraged BP: 116/64 mmHg    Overview:  July 2014: Controlled with Bystolic Sep 5188: Changed to atenolol.  Last Assessment & Plan:  Will change to atenolol for cost and follow  . Falling episodes    these have occurred in the past and again recurring 2011  . Family history of adverse reaction to anesthesia    "mother died during bypass surgery but not sure if it has to do with anesthesia"  . Gastric ulcer   . GERD (gastroesophageal reflux disease)   . H/O medication noncompliance    Due to loss of insurance  . H/O multiple concussions   . Hard of hearing   . History of blood transfusion 12/20/2013  . Hx of CABG    2000,  / one median sternotomy suture broken her chest x-ray November, 2010, no clinical significance  . Hyperlipidemia   . Hypertension    pt. denies  .  Iron deficiency anemia   . Long term (current) use of anticoagulants [Z79.01] 07/20/2016  . Low back pain 06/12/2009   Qualifier: Diagnosis of  By: [name]   . Nephrolithiasis   . Orthostasis   . OSA (obstructive sleep apnea)   . PAF (paroxysmal atrial fibrillation) ([place])    a. dx 2017, started on amiodarone/warfarin but patient intermittently noncompliant.  Marland Kitchen PSVT (paroxysmal supraventricular tachycardia) ([place])   . RBBB 07/09/2009   Qualifier: Diagnosis of  By: [name], MD, [name] [name]   . RLS (restless legs syndrome) 09/19/2009   Qualifier: Diagnosis of  By: [name]   Overview:  July 2014:  Controlled with Mirapex  Last Assessment & Plan:  Patient is doing well. Will continue current management and follow clinically.  . Seizure disorder ([place]) 01/09/2017  . Spondylosis    C5-6, C6-7 MRI 2010  . Syncope 03/2016  . Thyroid disease   . Tubulovillous adenoma of colon 2007  . Vitamin D deficiency 05/17/2017  . Wears glasses     Past Surgical History:  Procedure Laterality Date  . ANTERIOR CERVICAL DECOMP/DISCECTOMY FUSION N/A 05/31/2016   Procedure: ANTERIOR CERVICAL DECOMPRESSION/DISCECTOMY FUSION CERVICAL FIVE-SIX,CERVICAL SIX-SEVEN;  Surgeon: [name], MD;  Location: [place] [facility];  Service: Neurosurgery;  Laterality: N/A;  . CARDIAC CATHETERIZATION N/A 05/30/2015   Procedure: Left Heart Cath and Coronary Angiography;  Surgeon: [name], MD;  Location: [place] [facility];  Service: Cardiovascular;  Laterality: N/A;  . COLONOSCOPY    . CORONARY ARTERY BYPASS GRAFT     2000  . ESOPHAGOGASTRODUODENOSCOPY    . LEFT HEART CATH AND CORS/GRAFTS ANGIOGRAPHY N/A 12/15/2017   Procedure: LEFT HEART CATH AND CORS/GRAFTS ANGIOGRAPHY;  Surgeon: [name], MD;  Location: [place] [facility];  Service: Cardiovascular;  Laterality: N/A;  . LEFT HEART CATHETERIZATION WITH CORONARY/GRAFT ANGIOGRAM N/A 08/01/2012   Procedure: LEFT HEART CATHETERIZATION WITH Beatrix Fetters;  Surgeon: [name], MD;  Location: [facility] CATH LAB;  Service: Cardiovascular;  Laterality: N/A;  . LEFT HEART CATHETERIZATION WITH CORONARY/GRAFT ANGIOGRAM N/A 01/03/2015   Procedure: LEFT HEART CATHETERIZATION WITH Beatrix Fetters;  Surgeon: [name], MD;  Location: [facility] CATH LAB;  Service: Cardiovascular;  Laterality: N/A;  . NASAL SEPTUM SURGERY     UP3  . PERCUTANEOUS CORONARY STENT INTERVENTION (PCI-S)  10/2008   mRCA PCI  2.75 x 23 Abbott Xience V drug-eluting stent   . RIGHT/LEFT HEART CATH AND CORONARY/GRAFT ANGIOGRAPHY N/A 01/07/2020   Procedure: RIGHT/LEFT HEART CATH AND  CORONARY/GRAFT ANGIOGRAPHY;  Surgeon: [name], MD;  Location: [place] [facility] LAB;  Service: Cardiovascular;  Laterality: N/A;  . ULTRASOUND GUIDANCE FOR VASCULAR ACCESS  12/15/2017   Procedure: Ultrasound Guidance For Vascular Access;  Surgeon: [name], MD;  Location: [place] [facility] LAB;  Service: Cardiovascular;;    Social History   Socioeconomic History  . Marital status: Married    Spouse name: Not on file  . Number of children: 4  . Years of education: 55  . Highest education level: Not on file  Occupational History  . Occupation: retired  Tobacco Use  . Smoking status: Never Smoker  . Smokeless tobacco: Never Used  Vaping Use  . Vaping Use: Never used  Substance and Sexual Activity  . Alcohol use: No    Alcohol/week: 0.0 standard drinks    Comment: stopped drinking in 1998  . Drug use: No  . Sexual activity: Not on file  Other Topics Concern  . Not on file  Social History Narrative   Patient is right handed.   Patient does not drink caffeine.   Social Determinants of Health   Financial Resource Strain:   . Difficulty of Paying Living Expenses: Not on file  Food Insecurity:   . Worried About Charity fundraiser in the Last Year: Not on file  . Ran Out of Food in the Last Year: Not on file  Transportation Needs:   . Lack of Transportation (Medical): Not on file  . Lack of Transportation (Non-Medical): Not on file  Physical Activity:   . Days of Exercise per Week: Not on file  . Minutes of Exercise per Session: Not on file  Stress:   . Feeling of Stress : Not on file  Social Connections:   . Frequency of Communication with Friends and Family: Not on file  . Frequency of Social Gatherings with Friends and Family: Not on file  . Attends Religious Services: Not on file  . Active Member of Clubs or Organizations: Not on file  . Attends Archivist Meetings: Not on file  . Marital Status: Not on file  Intimate Partner Violence:   . Fear  of Current or Ex-Partner: Not on file  . Emotionally Abused: Not on file  . Physically Abused: Not on file  . Sexually Abused: Not on file    Current Outpatient Medications on File Prior to Visit  Medication Sig Dispense Refill  . albuterol (VENTOLIN HFA) 108 (90 Base) MCG/ACT inhaler Inhale 2 puffs into the lungs every 6 (six) hours as needed for wheezing or shortness of breath. 18 g 2  . amLODipine (NORVASC) 5 MG tablet TAKE 1 TABLET (5 MG TOTAL) BY MOUTH DAILY. 90 tablet 2  . atorvastatin (LIPITOR) 80 MG tablet TAKE 1 TABLET (80 MG TOTAL) BY MOUTH DAILY. 90 tablet 3  . Continuous Blood Gluc Receiver (FREESTYLE LIBRE 14 DAY READER) DEVI For continuous Blood Glucose monitoring 1 each 0  . Continuous Blood Gluc Sensor (FREESTYLE LIBRE 14 DAY SENSOR) MISC 1 Device by Does not apply route every 14 (fourteen) days. 6 each 3  . divalproex (DEPAKOTE ER) 250 MG 24 hr tablet Take 3 tablets (750 mg total) by mouth at bedtime. 180 tablet 0  . ELIQUIS 5 MG TABS tablet TAKE 1 TABLET (5 MG TOTAL) BY MOUTH 2 (TWO) TIMES DAILY. 60 tablet 5  . fenofibrate (TRICOR) 145 MG tablet TAKE 1 TABLET BY MOUTH ONCE DAILY 90 tablet 1  . furosemide (LASIX) 20 MG tablet TAKE 1 TABLET BY MOUTH DAILY AS NEEDED FOR EDEMA 30 tablet 3  . glucose blood (ACCU-CHEK AVIVA) test strip Use as directed to check blood sugar 4 times daily.  DX E11.9 200 each 6  . insulin aspart (NOVOLOG FLEXPEN) 100 UNIT/ML FlexPen INJECT AS DIRECTED 3 TIMES A DAY (JUST BEFORE EACH MEAL) 70-80-115 UNITS 15 mL 1  . insulin NPH Human (NOVOLIN N) 100 UNIT/ML injection Inject 0.4 mLs (40 Units total) into the skin at bedtime. 30 mL 11  . Insulin Pen Needle 31G X 8 MM MISC 1 each by Does not apply route 3 (three) times daily. E11.9 60 each 0  . isosorbide mononitrate (IMDUR) 60 MG 24 hr tablet Take 1.5 tablets (90 mg total) by mouth daily. 135 tablet 3  . levothyroxine (SYNTHROID) 100 MCG tablet TAKE 1 TABLET (100 MCG TOTAL) BY MOUTH DAILY. 90 tablet 1  .  metoprolol succinate (TOPROL-XL) 50 MG 24  hr tablet TAKE 1 AND 1/2 TABLETS EVERY DAY FOR A TOTAL OF 75 MG 135 tablet 3  . nitroGLYCERIN (NITROSTAT) 0.4 MG SL tablet PLACE 1 TABLET UNDER THE TONGUE EVERY 5 MINUTES AS NEEDED FOR CHEST PAIN. 25 tablet 1  . pantoprazole (PROTONIX) 40 MG tablet Take 1 tablet (40 mg total) by mouth daily. 90 tablet 3  . pramipexole (MIRAPEX) 1 MG tablet TAKE 1/2 TO 1 TABLET AT BEDTIME 90 tablet 0  . ranolazine (RANEXA) 500 MG 12 hr tablet TAKE 1 TABLET BY MOUTH TWICE DAILY 60 tablet 5  . rOPINIRole (REQUIP) 1 MG tablet TAKE 1 TABLET BY MOUTH AT BEDTIME 30 tablet 1  . sertraline (ZOLOFT) 100 MG tablet TAKE 1 TABLET (100 MG TOTAL) BY MOUTH 2 (TWO) TIMES DAILY. 180 tablet 1  . tiZANidine (ZANAFLEX) 4 MG tablet Take 1 tablet (4 mg total) by mouth every 6 (six) hours as needed for muscle spasms. 40 tablet 2  . ULTICARE INSULIN SYRINGE 31G X 5/16" 1 ML MISC USE TO INJECT INSULIN DAILY 100 each 0  . Vitamin D, Ergocalciferol, (DRISDOL) 1.25 MG (50000 UNIT) CAPS capsule Take 1 capsule (50,000 Units total) by mouth every 7 (seven) days. 4 capsule 4   No current facility-administered medications on file prior to visit.    Allergies  Allergen Reactions  . Morphine Other (See Comments)    hallucinations  . Shellfish-Derived Products Itching    Family History  Problem Relation Age of Onset  . Pancreatic cancer Brother   . Diabetes Brother   . Coronary artery disease Brother   . Stroke Brother   . Diabetes Brother   . Diabetes Mother   . Heart failure Mother   . Heart failure Father   . Hypothyroidism Brother   . Coronary artery disease Brother   . Other Brother        colon surgery  . Heart attack Other        Nephew  . Irregular heart beat Daughter   . Cancer Maternal Grandmother        unknown   . Colon cancer Neg Hx   . Stomach cancer Neg Hx   . Liver cancer Neg Hx   . Rectal cancer Neg Hx   . Esophageal cancer Neg Hx     BP 140/76 (BP Location: Right  Arm, Patient Position: Sitting, Cuff Size: Large)   Pulse 72   Ht 5\' 9"  (1.753 m)   Wt 205 lb 6.4 oz (93.2 kg)   SpO2 96%   BMI 30.33 kg/m    Review of Systems Denies nausea.      Objective:   Physical Exam VITAL SIGNS:  See vs page GENERAL: no distress Pulses: dorsalis pedis intact bilat.   MSK: no deformity of the feet CV: no leg edema Skin:  no ulcer on the feet.  normal color and temp on the feet.   Neuro: sensation is intact to touch on the feet.    Lab Results  Component Value Date   HGBA1C 7.9 (A) 09/17/2020       Assessment & Plan:  Insulin-requiring type 2 DM, with PAD: uncontrolled.    Patient Instructions  check your blood sugar twice a day.  vary the time of day when you check, between before the 3 meals, and at bedtime.  also check if you have symptoms of your blood sugar being too high or too low.  please keep a record of the readings and bring it to your  next appointment here (or you can bring the meter itself).  You can write it on any piece of paper.  please call us sooner if your blood sugar goes below 70, or if you have a lot of readings over 200. Please continue the same insulins, and: I have sent a prescription to your pharmacy, to Trulicity to 1.5 mg per week.   Please come back for a follow-up appointment in 3 months.  Diabetic DME Suppliers   [email or web address] [phone] Fax [phone]  [facility] [email or web address] [phone] Fax [phone]  [email or web address] [phone] ext [phone] Fax [phone]  [email or web address] [phone] Fax [phone]  [facility] [email or web address] [phone] Fax [phone]  [email or web address] [phone] Fax [phone]  [facility] [email or web address] [phone] Fax [phone]  [facility] [email or web address] [phone] Fax  [email or web address] [phone] option [phone] Fax [phone]  [facility] [email or web address] [phone] Fax [phone]  [place] [facility] [phone] Fax [phone]  [place] [email or web address] [phone] Fax [phone]

## 2020-09-19 MED FILL — PFIZER-BIONTECH COVID-19 VA: 30 | 1 days supply | Qty: 0 | Fill #0

## 2020-09-22 ENCOUNTER — Ambulatory Visit: Payer: Medicare PPO | Admitting: Family Medicine

## 2020-09-30 ENCOUNTER — Ambulatory Visit: Payer: Medicare PPO | Admitting: Family Medicine

## 2020-09-30 MED FILL — tiZANidine HCL 4 MG TABS: 4 | 10 days supply | Qty: 40 | Fill #1

## 2020-09-30 MED FILL — LEVOTHYROXINE SODIUM 100 MC: 100 | 90 days supply | Qty: 90 | Fill #1

## 2020-09-30 MED FILL — TRULICITY 1.5 MG/0.5 ML PEN: 1.5 | 28 days supply | Qty: 2 | Fill #0

## 2020-09-30 MED FILL — RANOLAZINE ER 500 MG TABLET: 500 | 30 days supply | Qty: 60 | Fill #1

## 2020-09-30 MED FILL — ELIQUIS 5 MG TABLET: 5 | 30 days supply | Qty: 60 | Fill #4

## 2020-09-30 MED FILL — VIT D2 1.25 MG (50,000 UNIT: 1.25 MG | 28 days supply | Qty: 4 | Fill #4

## 2020-10-01 ENCOUNTER — Other Ambulatory Visit: Payer: Self-pay

## 2020-10-01 ENCOUNTER — Encounter: Payer: Self-pay | Admitting: Family Medicine

## 2020-10-01 ENCOUNTER — Ambulatory Visit (INDEPENDENT_AMBULATORY_CARE_PROVIDER_SITE_OTHER): Payer: Medicare PPO | Admitting: Family Medicine

## 2020-10-01 DIAGNOSIS — M7660 Achilles tendinitis, unspecified leg: Secondary | ICD-10-CM | POA: Diagnosis not present

## 2020-10-01 NOTE — Patient Instructions (Signed)
Good to see you Please continue the pennsaid as needed   Please send me a message in MyChart with any questions or updates.  Please see [place] back as needed.   --Dr. Raeford Razor

## 2020-10-01 NOTE — Assessment & Plan Note (Signed)
His Haglund's is improved and he is much more functional. -Counseled on home exercise therapy and supportive care. -Pennsaid samples. -Follow-up as needed.

## 2020-10-01 NOTE — Progress Notes (Signed)
[name] [name] - 74 y.o. male MRN [id number]  Date of birth: 13-May-1946  SUBJECTIVE:  Including CC & ROS.  Chief Complaint  Patient presents with  . Follow-up    left achilles    ISAIS KLIPFEL is a 74 y.o. male that is following up for his Achilles pain.  He reports his Achilles is feeling much improved.  He still has pain intermittently if he does walk for an extended period of time.   Review of Systems See HPI   HISTORY: Past Medical, Surgical, Social, and Family History Reviewed & Updated per EMR.   Pertinent Historical Findings include:  Past Medical History:  Diagnosis Date  . Acquired atrophy of thyroid 06/05/2013   Overview:  July 2014: controlled on synthroid 31mcg since 2012 May 2015: decreased to Synthroid 50 mcg  Last Assessment & Plan:  Pt doing well with most recent TSH within normal range.  Will continue current dosage of Synthroid 36mcg. Pt reminded to take medication on empty stomach. Will continue to monitor with periodic laboratory assessment in 3 months.   . Anemia    hemoglobin 7.4, iron deficiency, January, 2011, 2 unit transfusion, endoscopy normal, capsule endoscopy February, 2011 no small bowel abnormalities.   Most likely source gastric erosions, followed by GI  . Anxiety   . Bradycardia   . CAD (coronary artery disease)    A. CABG in 2000,status post cardiac cath in 2006, 2009 ....continued chest pain and SOB despite oral medication adjestments including Ranexa. B. Cath November 2009/ mRCA - 2.75 x 23 Abbott Xience V drug-eluting stent ...11/26/2008 to distal  RCA leading to acute marginal.  C. Cath 07/2012 for CP - stable anatomy, med rx. d. cath 2015 and 05/30/2015 stable anatomy, consider Myoview if has CP again  . Carotid artery disease ([place]) 08/01/2015   Doppler, May 29, 2015, 1-39% bilateral ICA   . Cerebral ischemia    MRI November, 2010, chronic microvascular ischemia  . CKD (chronic kidney disease), stage III ([place])   . Concussion   . Depression     Bipolar  . Diabetes mellitus ([place])   . Edema   . Essential hypertension 06/05/2013   Overview:  July 2014: Controlled with Bystolic Sep 9476: Changed to atenolol.  Last Assessment & Plan:  Will change to atenolol for cost and follow Improved.  Medication compliance strongly encouraged BP: 116/64 mmHg    Overview:  July 2014: Controlled with Bystolic Sep 5465: Changed to atenolol.  Last Assessment & Plan:  Will change to atenolol for cost and follow  . Falling episodes    these have occurred in the past and again recurring 2011  . Family history of adverse reaction to anesthesia    "mother died during bypass surgery but not sure if it has to do with anesthesia"  . Gastric ulcer   . GERD (gastroesophageal reflux disease)   . H/O medication noncompliance    Due to loss of insurance  . H/O multiple concussions   . Hard of hearing   . History of blood transfusion 12/20/2013  . Hx of CABG    2000,  / one median sternotomy suture broken her chest x-ray November, 2010, no clinical significance  . Hyperlipidemia   . Hypertension    pt. denies  . Iron deficiency anemia   . Long term (current) use of anticoagulants [Z79.01] 07/20/2016  . Low back pain 06/12/2009   Qualifier: Diagnosis of  By: [name]   . Nephrolithiasis   .  Orthostasis   . OSA (obstructive sleep apnea)   . PAF (paroxysmal atrial fibrillation) ([place])    a. dx 2017, started on amiodarone/warfarin but patient intermittently noncompliant.  Marland Kitchen PSVT (paroxysmal supraventricular tachycardia) ([place])   . RBBB 07/09/2009   Qualifier: Diagnosis of  By: [name], MD, [name] [name]   . RLS (restless legs syndrome) 09/19/2009   Qualifier: Diagnosis of  By: [name]   Overview:  July 2014: Controlled with Mirapex  Last Assessment & Plan:  Patient is doing well. Will continue current management and follow clinically.  . Seizure disorder ([place]) 01/09/2017  . Spondylosis    C5-6, C6-7 MRI 2010  . Syncope 03/2016  . Thyroid disease    . Tubulovillous adenoma of colon 2007  . Vitamin D deficiency 05/17/2017  . Wears glasses     Past Surgical History:  Procedure Laterality Date  . ANTERIOR CERVICAL DECOMP/DISCECTOMY FUSION N/A 05/31/2016   Procedure: ANTERIOR CERVICAL DECOMPRESSION/DISCECTOMY FUSION CERVICAL FIVE-SIX,CERVICAL SIX-SEVEN;  Surgeon: [name], MD;  Location: [place] [facility];  Service: Neurosurgery;  Laterality: N/A;  . CARDIAC CATHETERIZATION N/A 05/30/2015   Procedure: Left Heart Cath and Coronary Angiography;  Surgeon: [name], MD;  Location: [place] [facility] LAB;  Service: Cardiovascular;  Laterality: N/A;  . COLONOSCOPY    . CORONARY ARTERY BYPASS GRAFT     2000  . ESOPHAGOGASTRODUODENOSCOPY    . LEFT HEART CATH AND CORS/GRAFTS ANGIOGRAPHY N/A 12/15/2017   Procedure: LEFT HEART CATH AND CORS/GRAFTS ANGIOGRAPHY;  Surgeon: [name], MD;  Location: [place] CV LAB;  Service: Cardiovascular;  Laterality: N/A;  . LEFT HEART CATHETERIZATION WITH CORONARY/GRAFT ANGIOGRAM N/A 08/01/2012   Procedure: LEFT HEART CATHETERIZATION WITH Beatrix Fetters;  Surgeon: [name], MD;  [facility] CATH LAB;  Service: Cardiovascular;  Laterality: N/A;  . LEFT HEART CATHETERIZATION WITH CORONARY/GRAFT ANGIOGRAM N/A 01/03/2015   Procedure: LEFT HEART CATHETERIZATION WITH Beatrix Fetters;  Surgeon: [name], MD;  Location: [facility] CATH LAB;  Service: Cardiovascular;  Laterality: N/A;  . NASAL SEPTUM SURGERY     UP3  . PERCUTANEOUS CORONARY STENT INTERVENTION (PCI-S)  10/2008   mRCA PCI  2.75 x 23 Abbott Xience V drug-eluting stent   . RIGHT/LEFT HEART CATH AND CORONARY/GRAFT ANGIOGRAPHY N/A 01/07/2020   Procedure: RIGHT/LEFT HEART CATH AND CORONARY/GRAFT ANGIOGRAPHY;  Surgeon: [name], MD;  Location: [place] CV LAB;  Service: Cardiovascular;  Laterality: N/A;  . ULTRASOUND GUIDANCE FOR VASCULAR ACCESS  12/15/2017   Procedure: Ultrasound Guidance For Vascular Access;  Surgeon:  [name], MD;  Location: [place] [facility] LAB;  Service: Cardiovascular;;    Family History  Problem Relation Age of Onset  . Pancreatic cancer Brother   . Diabetes Brother   . Coronary artery disease Brother   . Stroke Brother   . Diabetes Brother   . Diabetes Mother   . Heart failure Mother   . Heart failure Father   . Hypothyroidism Brother   . Coronary artery disease Brother   . Other Brother        colon surgery  . Heart attack Other        Nephew  . Irregular heart beat Daughter   . Cancer Maternal Grandmother        unknown   . Colon cancer Neg Hx   . Stomach cancer Neg Hx   . Liver cancer Neg Hx   . Rectal cancer Neg Hx   . Esophageal cancer Neg  Hx     Social History   Socioeconomic History  . Marital status: Married    Spouse name: Not on file  . Number of children: 4  . Years of education: 33  . Highest education level: Not on file  Occupational History  . Occupation: retired  Tobacco Use  . Smoking status: Never Smoker  . Smokeless tobacco: Never Used  Vaping Use  . Vaping Use: Never used  Substance and Sexual Activity  . Alcohol use: No    Alcohol/week: 0.0 standard drinks    Comment: stopped drinking in 1998  . Drug use: No  . Sexual activity: Not on file  Other Topics Concern  . Not on file  Social History Narrative   Patient is right handed.   Patient does not drink caffeine.   Social Determinants of Health   Financial Resource Strain:   . Difficulty of Paying Living Expenses: Not on file  Food Insecurity:   . Worried About Charity fundraiser in the Last Year: Not on file  . Ran Out of Food in the Last Year: Not on file  Transportation Needs:   . Lack of Transportation (Medical): Not on file  . Lack of Transportation (Non-Medical): Not on file  Physical Activity:   . Days of Exercise per Week: Not on file  . Minutes of Exercise per Session: Not on file  Stress:   . Feeling of Stress : Not on file  Social Connections:     . Frequency of Communication with Friends and Family: Not on file  . Frequency of Social Gatherings with Friends and Family: Not on file  . Attends Religious Services: Not on file  . Active Member of Clubs or Organizations: Not on file  . Attends Archivist Meetings: Not on file  . Marital Status: Not on file  Intimate Partner Violence:   . Fear of Current or Ex-Partner: Not on file  . Emotionally Abused: Not on file  . Physically Abused: Not on file  . Sexually Abused: Not on file     PHYSICAL EXAM:  VS: BP 126/69   Pulse 85   Ht 5\' 9"  (1.753 m)   Wt 200 lb (90.7 kg)   BMI 29.53 kg/m  Physical Exam Gen: NAD, alert, cooperative with exam, well-appearing   ASSESSMENT & PLAN:   Achilles tendon pain His Haglund's is improved and he is much more functional. -Counseled on home exercise therapy and supportive care. -Pennsaid samples. -Follow-up as needed.

## 2020-10-01 NOTE — Progress Notes (Signed)
Medication Samples have been provided to the patient.  Drug name: Pennssaid       Strength: 2%        Qty: 2 Boxes  LOT: [id number]  Exp.Date: 02/2021  Dosing instructions: Use a pea size amount and rub gently.  The patient has been instructed regarding the correct time, dose, and frequency of taking this medication, including desired effects and most common side effects.   Sherrie George, [place] 11:36 AM 10/01/2020

## 2020-10-10 MED FILL — NOVOLOG FLEXPEN SYRINGE: 100 | 7 days supply | Qty: 15 | Fill #1

## 2020-10-10 MED FILL — FUROSEMIDE 20 MG TAB: 20 | 30 days supply | Qty: 30 | Fill #1

## 2020-10-16 ENCOUNTER — Ambulatory Visit: Payer: Medicare PPO | Admitting: Endocrinology

## 2020-10-17 ENCOUNTER — Ambulatory Visit (INDEPENDENT_AMBULATORY_CARE_PROVIDER_SITE_OTHER): Payer: Medicare PPO | Admitting: Family Medicine

## 2020-10-17 ENCOUNTER — Other Ambulatory Visit: Payer: Self-pay

## 2020-10-17 ENCOUNTER — Encounter: Payer: Self-pay | Admitting: Family Medicine

## 2020-10-17 ENCOUNTER — Ambulatory Visit: Payer: Self-pay

## 2020-10-17 DIAGNOSIS — M1711 Unilateral primary osteoarthritis, right knee: Secondary | ICD-10-CM

## 2020-10-17 MED ORDER — PENNSAID 2 % EX SOLN: 1.0000 "application " | Freq: Two times a day (BID) | CUTANEOUS | 2 refills | Status: DC

## 2020-10-17 NOTE — Patient Instructions (Signed)
Good to see you Please try ice  Please try the exercises  Please try the rub on medicine   Please send me a message in MyChart with any questions or updates.  Please see me back in 4 weeks.   --Dr. Allyse Fregeau  

## 2020-10-17 NOTE — Assessment & Plan Note (Addendum)
Acute on chronic in nature.  Has degenerative changes of the meniscus and joint space.  Likely has some weakness as well. -Counseled on home exercise therapy and supportive care. -Pennsaid. -Could consider injection physical therapy.

## 2020-10-17 NOTE — Progress Notes (Signed)
[name] [name] - 74 y.o. male MRN [id number]  Date of birth: 28-Aug-1946  SUBJECTIVE:  Including CC & ROS.  Chief Complaint  Patient presents with  . Knee Pain    right    [name] [name] is a 74 y.o. male that is presenting with acute on chronic right knee pain.  He feels it in the medial compartment.  No history of surgery.  Does have a distant remote history of injury.  Localized to the knee.  Seems to be worse the beginning of the day..  Independent review of the right knee x-ray from 2018 shows degenerative changes on the medial compartment.   Review of Systems See HPI   HISTORY: Past Medical, Surgical, Social, and Family History Reviewed & Updated per EMR.   Pertinent Historical Findings include:  Past Medical History:  Diagnosis Date  . Acquired atrophy of thyroid 06/05/2013   Overview:  July 2014: controlled on synthroid 37mcg since 2012 May 2015: decreased to Synthroid 50 mcg  Last Assessment & Plan:  Pt doing well with most recent TSH within normal range.  Will continue current dosage of Synthroid 2mcg. Pt reminded to take medication on empty stomach. Will continue to monitor with periodic laboratory assessment in 3 months.   . Anemia    hemoglobin 7.4, iron deficiency, January, 2011, 2 unit transfusion, endoscopy normal, capsule endoscopy February, 2011 no small bowel abnormalities.   Most likely source gastric erosions, followed by GI  . Anxiety   . Bradycardia   . CAD (coronary artery disease)    A. CABG in 2000,status post cardiac cath in 2006, 2009 ....continued chest pain and SOB despite oral medication adjestments including Ranexa. B. Cath November 2009/ mRCA - 2.75 x 23 Abbott Xience V drug-eluting stent ...11/26/2008 to distal  RCA leading to acute marginal.  C. Cath 07/2012 for CP - stable anatomy, med rx. d. cath 2015 and 05/30/2015 stable anatomy, consider Myoview if has CP again  . Carotid artery disease ([place]) 08/01/2015   Doppler, May 29, 2015, 1-39% bilateral  ICA   . Cerebral ischemia    MRI November, 2010, chronic microvascular ischemia  . CKD (chronic kidney disease), stage III ([place])   . Concussion   . Depression    Bipolar  . Diabetes mellitus ([place])   . Edema   . Essential hypertension 06/05/2013   Overview:  July 2014: Controlled with Bystolic Sep 5809: Changed to atenolol.  Last Assessment & Plan:  Will change to atenolol for cost and follow Improved.  Medication compliance strongly encouraged BP: 116/64 mmHg    Overview:  July 2014: Controlled with Bystolic Sep 9833: Changed to atenolol.  Last Assessment & Plan:  Will change to atenolol for cost and follow  . Falling episodes    these have occurred in the past and again recurring 2011  . Family history of adverse reaction to anesthesia    "mother died during bypass surgery but not sure if it has to do with anesthesia"  . Gastric ulcer   . GERD (gastroesophageal reflux disease)   . H/O medication noncompliance    Due to loss of insurance  . H/O multiple concussions   . Hard of hearing   . History of blood transfusion 12/20/2013  . Hx of CABG    2000,  / one median sternotomy suture broken her chest x-ray November, 2010, no clinical significance  . Hyperlipidemia   . Hypertension    pt. denies  . Iron deficiency anemia   .  Long term (current) use of anticoagulants [Z79.01] 07/20/2016  . Low back pain 06/12/2009   Qualifier: Diagnosis of  By: [name]   . Nephrolithiasis   . Orthostasis   . OSA (obstructive sleep apnea)   . PAF (paroxysmal atrial fibrillation) ([place])    a. dx 2017, started on amiodarone/warfarin but patient intermittently noncompliant.  Marland Kitchen PSVT (paroxysmal supraventricular tachycardia) ([place])   . RBBB 07/09/2009   Qualifier: Diagnosis of  By: [name], MD, [name] [name]   . RLS (restless legs syndrome) 09/19/2009   Qualifier: Diagnosis of  By: [name]   Overview:  July 2014: Controlled with Mirapex  Last Assessment & Plan:  Patient is doing well. Will  continue current management and follow clinically.  . Seizure disorder ([employer]) 01/09/2017  . Spondylosis    C5-6, C6-7 MRI 2010  . Syncope 03/2016  . Thyroid disease   . Tubulovillous adenoma of colon 2007  . Vitamin D deficiency 05/17/2017  . Wears glasses     Past Surgical History:  Procedure Laterality Date  . ANTERIOR CERVICAL DECOMP/DISCECTOMY FUSION N/A 05/31/2016   Procedure: ANTERIOR CERVICAL DECOMPRESSION/DISCECTOMY FUSION CERVICAL FIVE-SIX,CERVICAL SIX-SEVEN;  Surgeon: [name], MD;  Location: [facility];  Service: Neurosurgery;  Laterality: N/A;  . CARDIAC CATHETERIZATION N/A 05/30/2015   Procedure: Left Heart Cath and Coronary Angiography;  Surgeon: [name], MD;  Location: [facility];  Service: Cardiovascular;  Laterality: N/A;  . COLONOSCOPY    . CORONARY ARTERY BYPASS GRAFT     2000  . ESOPHAGOGASTRODUODENOSCOPY    . LEFT HEART CATH AND CORS/GRAFTS ANGIOGRAPHY N/A 12/15/2017   Procedure: LEFT HEART CATH AND CORS/GRAFTS ANGIOGRAPHY;  Surgeon: [name], MD;  Location: [facility];  Service: Cardiovascular;  Laterality: N/A;  . LEFT HEART CATHETERIZATION WITH CORONARY/GRAFT ANGIOGRAM N/A 08/01/2012   Procedure: LEFT HEART CATHETERIZATION WITH Beatrix Fetters;  Surgeon: [name], MD;  Location: [facility];  Service: Cardiovascular;  Laterality: N/A;  . LEFT HEART CATHETERIZATION WITH CORONARY/GRAFT ANGIOGRAM N/A 01/03/2015   Procedure: LEFT HEART CATHETERIZATION WITH Beatrix Fetters;  Surgeon: [name], MD;  Location: [facility];  Service: Cardiovascular;  Laterality: N/A;  . NASAL SEPTUM SURGERY     UP3  . PERCUTANEOUS CORONARY STENT INTERVENTION (PCI-S)  10/2008   mRCA PCI  2.75 x 23 Abbott Xience V drug-eluting stent   . RIGHT/LEFT HEART CATH AND CORONARY/GRAFT ANGIOGRAPHY N/A 01/07/2020   Procedure: RIGHT/LEFT HEART CATH AND CORONARY/GRAFT ANGIOGRAPHY;  Surgeon: [name], MD;  Location: [facility];  Service: Cardiovascular;  Laterality: N/A;  . ULTRASOUND GUIDANCE FOR VASCULAR ACCESS  12/15/2017   Procedure: Ultrasound Guidance For Vascular Access;  Surgeon: [name], MD;  Location: [facility];  Service: Cardiovascular;;    Family History  Problem Relation Age of Onset  . Pancreatic cancer Brother   . Diabetes Brother   . Coronary artery disease Brother   . Stroke Brother   . Diabetes Brother   . Diabetes Mother   . Heart failure Mother   . Heart failure Father   . Hypothyroidism Brother   . Coronary artery disease Brother   . Other Brother        colon surgery  . Heart attack Other        Nephew  . Irregular heart beat Daughter   . Cancer Maternal Grandmother        unknown   .  Colon cancer Neg Hx   . Stomach cancer Neg Hx   . Liver cancer Neg Hx   . Rectal cancer Neg Hx   . Esophageal cancer Neg Hx     Social History   Socioeconomic History  . Marital status: Married    Spouse name: Not on file  . Number of children: 4  . Years of education: 46  . Highest education level: Not on file  Occupational History  . Occupation: retired  Tobacco Use  . Smoking status: Never Smoker  . Smokeless tobacco: Never Used  Vaping Use  . Vaping Use: Never used  Substance and Sexual Activity  . Alcohol use: No    Alcohol/week: 0.0 standard drinks    Comment: stopped drinking in 1998  . Drug use: No  . Sexual activity: Not on file  Other Topics Concern  . Not on file  Social History Narrative   Patient is right handed.   Patient does not drink caffeine.   Social Determinants of Health   Financial Resource Strain:   . Difficulty of Paying Living Expenses: Not on file  Food Insecurity:   . Worried About Charity fundraiser in the Last Year: Not on file  . Ran Out of Food in the Last Year: Not on file  Transportation Needs:   . Lack of Transportation (Medical): Not on file  . Lack of Transportation (Non-Medical): Not on file  Physical  Activity:   . Days of Exercise per Week: Not on file  . Minutes of Exercise per Session: Not on file  Stress:   . Feeling of Stress : Not on file  Social Connections:   . Frequency of Communication with Friends and Family: Not on file  . Frequency of Social Gatherings with Friends and Family: Not on file  . Attends Religious Services: Not on file  . Active Member of Clubs or Organizations: Not on file  . Attends Archivist Meetings: Not on file  . Marital Status: Not on file  Intimate Partner Violence:   . Fear of Current or Ex-Partner: Not on file  . Emotionally Abused: Not on file  . Physically Abused: Not on file  . Sexually Abused: Not on file     PHYSICAL EXAM:  VS: BP 130/79   Pulse 73   Ht 5\' 9"  (1.753 m)   Wt 200 lb (90.7 kg)   BMI 29.53 kg/m  Physical Exam Gen: NAD, alert, cooperative with exam, well-appearing MSK:  Right knee: No obvious effusion. No ecchymosis. Normal range of motion. No instability. Neurovascular intact  Limited ultrasound: Right knee:  No effusion in the suprapatellar pouch. Normal-appearing quadricep patellar tendon. Medial joint space narrowing and degenerative changes of the medial meniscus. Mild degenerative change of the lateral meniscus.  Summary: Degenerative changes of the meniscus and medial knee.  Ultrasound and interpretation by Clearance Coots, MD    ASSESSMENT & PLAN:   OA (osteoarthritis) of knee Acute on chronic in nature.  Has degenerative changes of the meniscus and joint space.  Likely has some weakness as well. -Counseled on home exercise therapy and supportive care. -[facility]. -Could consider injection physical therapy.

## 2020-10-22 ENCOUNTER — Other Ambulatory Visit: Payer: Self-pay | Admitting: Family Medicine

## 2020-10-22 MED FILL — NovoLIN N 100 UNIT/ML SUSP: 100 | 75 days supply | Qty: 30 | Fill #1

## 2020-10-22 MED FILL — AMLODIPINE BESYLATE 5 MG TA: 5 | 90 days supply | Qty: 90 | Fill #1

## 2020-10-22 NOTE — Telephone Encounter (Signed)
Would you like Pt to continue Ergocalciferol?  

## 2020-10-22 NOTE — Progress Notes (Signed)
Cardiology Office Note   Date:  10/23/2020   ID:  [name] [name], DOB [birth year], MRN [id number]  PCP:  [name], MD    No chief complaint on file.  CAD  Wt Readings from Last 3 Encounters:  10/23/20 200 lb 12.8 oz (91.1 kg)  10/17/20 200 lb (90.7 kg)  10/01/20 200 lb (90.7 kg)       History of Present Illness: [name] [name] is a 74 y.o. male  with history ofCAD (CABG 2000, stent to RCA 2009), paroxysmal atrial fibrillation,PSVT,CKD III, noncompliance, orthostatic hypotension, iron deficiency anemia, prior gastric erosions/ulcer, ?chronic diastolic CHF (no diastolic dysfunction on echo and normal BNP), bipolar depression, DM, HTN, HLD, kidney stones, OSA, RBBB, deconditoning, thyroid disease, spondylosis and back pain.  He has prior history of chest discomfort, palpitations and dyspnearequiring med titration and repeat caths/monitorsover the years. His last PCI was in 2009. A CPX in 2011 was unrevealing, felt to reflect deconditioning and perhaps mild chronotropic incompetence. He had caths in 2015, 2016, and 12/2017 showing stable anatomy with patent bypass grafts and medical therapy recommended withpossible component of microvascular angina. In 03/2016 he was admitted near syncope and transient confusion. MRI was negative for stroke. EEG was normal. He was seen by neurology. There was no significant carotid artery stenosis on CTA. Echocardiogram demonstrated normal LV function with mild diastolic dysfunction. No arrhythmias noted on telemetry. TIA could not be ruled out. Aspirin was changed to Plavix. He was bradycardic and his beta blocker dose was reduced. He was readmitted 03/2016 with lightheadedness, confusion, slurred speech and right facial droop,left hand tremors, and intermittent hypotension. There was also reported history of occasional episodes of dizziness, slurred speech, confusion followed by lethargy. Patient would also fall asleep easily without memory  of the event.He has since been diagnosed with narcolepsy. His metoprolol was discontinued at one point but eventually restarted. He also required midodrine previously for orthostasis although does not appear to currently be on this.A monitor did demonstrate atrial fib/flutter so he was changed from Plavix to warfarin. Metoprolol was subsequently increased. In 09/2016 he was started on amiodarone. There does appear to be a history of compliance issues - notably including taking midodrine incorrectly, not reducing amiodarone as instructed (was on 400mg  daily for a while), or - per last OV 06/2018 - not filling amiodarone since 2017. At last OV he was reporting increased palpitations and decreased exercise tolerance with DOE. He was in NSR. BNP was normal. 2D echo 07/2018 showed EF 54-27%, normal diastolic function, mild LAE. Event monitor showed NSR with PACs/PVCs and short runs of SVT, lowest HR 45bpm. Carotid duplex 07/2018 showed minimal plaque without significant stenosis. He was supposed to follow-up 1 month after that visit but never came. In 11/2018 he was admitted for seizures suspected due to noncompliance for financial reasons. He also reported chest pain in setting of noncompliance.  Repeat cath in 2021.  Stable anatomy and normal right heart pressures. His SHOB was better after leaving the hospital.    THe [facility]  came back, specifically with bending down and standing.  Had some left facial swelling in 4/21 which we thought might be from salivary gland obstruction. Marland Kitchen CRI stable at discharge.  Since the last visit, he had a flu like illness, COVID negative, and he has been gradually getting more energy.  Isosorbide has helped with chest pain. Below 120 mg, he has more chest pain.   Rare palpitations.  No bleeding problems with the Eliquis.  Past Medical History:  Diagnosis Date  . Acquired atrophy of thyroid 06/05/2013   Overview:  July 2014: controlled on synthroid 51mcg since 2012 May  2015: decreased to Synthroid 50 mcg  Last Assessment & Plan:  Pt doing well with most recent TSH within normal range.  Will continue current dosage of Synthroid 24mcg. Pt reminded to take medication on empty stomach. Will continue to monitor with periodic laboratory assessment in 3 months.   . Anemia    hemoglobin 7.4, iron deficiency, January, 2011, 2 unit transfusion, endoscopy normal, capsule endoscopy February, 2011 no small bowel abnormalities.   Most likely source gastric erosions, followed by GI  . Anxiety   . Bradycardia   . CAD (coronary artery disease)    A. CABG in 2000,status post cardiac cath in 2006, 2009 ....continued chest pain and SOB despite oral medication adjestments including Ranexa. B. Cath November 2009/ mRCA - 2.75 x 23 Abbott Xience V drug-eluting stent ...11/26/2008 to distal  RCA leading to acute marginal.  C. Cath 07/2012 for CP - stable anatomy, med rx. d. cath 2015 and 05/30/2015 stable anatomy, consider Myoview if has CP again  . Carotid artery disease ([place]) 08/01/2015   Doppler, May 29, 2015, 1-39% bilateral ICA   . Cerebral ischemia    MRI November, 2010, chronic microvascular ischemia  . CKD (chronic kidney disease), stage III ([place])   . Concussion   . Depression    Bipolar  . Diabetes mellitus ([place])   . Edema   . Essential hypertension 06/05/2013   Overview:  July 2014: Controlled with Bystolic Sep 4163: Changed to atenolol.  Last Assessment & Plan:  Will change to atenolol for cost and follow Improved.  Medication compliance strongly encouraged BP: 116/64 mmHg    Overview:  July 2014: Controlled with Bystolic Sep 8453: Changed to atenolol.  Last Assessment & Plan:  Will change to atenolol for cost and follow  . Falling episodes    these have occurred in the past and again recurring 2011  . Family history of adverse reaction to anesthesia    "mother died during bypass surgery but not sure if it has to do with anesthesia"  . Gastric ulcer   . GERD  (gastroesophageal reflux disease)   . H/O medication noncompliance    Due to loss of insurance  . H/O multiple concussions   . Hard of hearing   . History of blood transfusion 12/20/2013  . Hx of CABG    2000,  / one median sternotomy suture broken her chest x-ray November, 2010, no clinical significance  . Hyperlipidemia   . Hypertension    pt. denies  . Iron deficiency anemia   . Long term (current) use of anticoagulants [Z79.01] 07/20/2016  . Low back pain 06/12/2009   Qualifier: Diagnosis of  By: [name]   . Nephrolithiasis   . Orthostasis   . OSA (obstructive sleep apnea)   . PAF (paroxysmal atrial fibrillation) ([place])    a. dx 2017, started on amiodarone/warfarin but patient intermittently noncompliant.  Marland Kitchen PSVT (paroxysmal supraventricular tachycardia) ([place])   . RBBB 07/09/2009   Qualifier: Diagnosis of  By: [name], MD, [name] [name]   . RLS (restless legs syndrome) 09/19/2009   Qualifier: Diagnosis of  By: [name]   Overview:  July 2014: Controlled with Mirapex  Last Assessment & Plan:  Patient is doing well. Will continue current management and follow clinically.  . Seizure disorder ([place]) 01/09/2017  .  Spondylosis    C5-6, C6-7 MRI 2010  . Syncope 03/2016  . Thyroid disease   . Tubulovillous adenoma of colon 2007  . Vitamin D deficiency 05/17/2017  . Wears glasses     Past Surgical History:  Procedure Laterality Date  . ANTERIOR CERVICAL DECOMP/DISCECTOMY FUSION N/A 05/31/2016   Procedure: ANTERIOR CERVICAL DECOMPRESSION/DISCECTOMY FUSION CERVICAL FIVE-SIX,CERVICAL SIX-SEVEN;  Surgeon: [name], MD;  Location: [facility];  Service: Neurosurgery;  Laterality: N/A;  . CARDIAC CATHETERIZATION N/A 05/30/2015   Procedure: Left Heart Cath and Coronary Angiography;  Surgeon: [name], MD;  Location: [facility];  Service: Cardiovascular;  Laterality: N/A;  . COLONOSCOPY    . CORONARY ARTERY BYPASS GRAFT     2000  . ESOPHAGOGASTRODUODENOSCOPY     . LEFT HEART CATH AND CORS/GRAFTS ANGIOGRAPHY N/A 12/15/2017   Procedure: LEFT HEART CATH AND CORS/GRAFTS ANGIOGRAPHY;  Surgeon: [name], MD;  Location: [place];  Service: Cardiovascular;  Laterality: N/A;  . LEFT HEART CATHETERIZATION WITH CORONARY/GRAFT ANGIOGRAM N/A 08/01/2012   Procedure: LEFT HEART CATHETERIZATION WITH Beatrix Fetters;  Surgeon: [name], MD;  Location: [facility];  Service: Cardiovascular;  Laterality: N/A;  . LEFT HEART CATHETERIZATION WITH CORONARY/GRAFT ANGIOGRAM N/A 01/03/2015   Procedure: LEFT HEART CATHETERIZATION WITH Beatrix Fetters;  Surgeon: [name], MD;  Location: [facility] CATH LAB;  Service: Cardiovascular;  Laterality: N/A;  . NASAL SEPTUM SURGERY     UP3  . PERCUTANEOUS CORONARY STENT INTERVENTION (PCI-S)  10/2008   mRCA PCI  2.75 x 23 Abbott Xience V drug-eluting stent   . RIGHT/LEFT HEART CATH AND CORONARY/GRAFT ANGIOGRAPHY N/A 01/07/2020   Procedure: RIGHT/LEFT HEART CATH AND CORONARY/GRAFT ANGIOGRAPHY;  Surgeon: [name], MD;  Location: [place] [facility];  Service: Cardiovascular;  Laterality: N/A;  . ULTRASOUND GUIDANCE FOR VASCULAR ACCESS  12/15/2017   Procedure: Ultrasound Guidance For Vascular Access;  Surgeon: [name], MD;  Location: [place];  Service: Cardiovascular;;     Current Outpatient Medications  Medication Sig Dispense Refill  . albuterol (VENTOLIN HFA) 108 (90 Base) MCG/ACT inhaler Inhale 2 puffs into the lungs every 6 (six) hours as needed for wheezing or shortness of breath. 18 g 2  . amLODipine (NORVASC) 5 MG tablet TAKE 1 TABLET (5 MG TOTAL) BY MOUTH DAILY. 90 tablet 2  . atorvastatin (LIPITOR) 80 MG tablet TAKE 1 TABLET (80 MG TOTAL) BY MOUTH DAILY. 90 tablet 3  . Continuous Blood Gluc Receiver (FREESTYLE LIBRE 14 DAY READER) DEVI For continuous Blood Glucose monitoring 1 each 0  . Continuous Blood Gluc Sensor (FREESTYLE LIBRE 14 DAY SENSOR) MISC 1  Device by Does not apply route every 14 (fourteen) days. 6 each 3  . Diclofenac Sodium (PENNSAID) 2 % SOLN Place 1 application onto the skin 2 (two) times daily. 112 g 2  . divalproex (DEPAKOTE ER) 250 MG 24 hr tablet Take 3 tablets (750 mg total) by mouth at bedtime. 180 tablet 0  . Dulaglutide (TRULICITY) 1.5 TM/5.4YT SOPN Inject 1.5 mg into the skin once a week. 6 mL 3  . ELIQUIS 5 MG TABS tablet TAKE 1 TABLET (5 MG TOTAL) BY MOUTH 2 (TWO) TIMES DAILY. 60 tablet 5  . fenofibrate (TRICOR) 145 MG tablet TAKE 1 TABLET BY MOUTH ONCE DAILY 90 tablet 1  . furosemide (LASIX) 20 MG tablet TAKE 1 TABLET BY MOUTH DAILY AS NEEDED FOR EDEMA 30 tablet 3  . glucose blood (ACCU-CHEK  AVIVA) test strip Use as directed to check blood sugar 4 times daily.  DX E11.9 200 each 6  . insulin aspart (NOVOLOG FLEXPEN) 100 UNIT/ML FlexPen INJECT AS DIRECTED 3 TIMES A DAY (JUST BEFORE EACH MEAL) 70-80-115 UNITS 15 mL 1  . insulin NPH Human (NOVOLIN N) 100 UNIT/ML injection Inject 0.4 mLs (40 Units total) into the skin at bedtime. 30 mL 11  . Insulin Pen Needle 31G X 8 MM MISC 1 each by Does not apply route 3 (three) times daily. E11.9 60 each 0  . levothyroxine (SYNTHROID) 100 MCG tablet TAKE 1 TABLET (100 MCG TOTAL) BY MOUTH DAILY. 90 tablet 1  . metoprolol succinate (TOPROL-XL) 50 MG 24 hr tablet TAKE 1 AND 1/2 TABLETS EVERY DAY FOR A TOTAL OF 75 MG 135 tablet 3  . nitroGLYCERIN (NITROSTAT) 0.4 MG SL tablet PLACE 1 TABLET UNDER THE TONGUE EVERY 5 MINUTES AS NEEDED FOR CHEST PAIN. 25 tablet 1  . pantoprazole (PROTONIX) 40 MG tablet Take 1 tablet (40 mg total) by mouth daily. 90 tablet 3  . pramipexole (MIRAPEX) 1 MG tablet TAKE 1/2 TO 1 TABLET AT BEDTIME 90 tablet 0  . ranolazine (RANEXA) 500 MG 12 hr tablet TAKE 1 TABLET BY MOUTH TWICE DAILY 60 tablet 5  . rOPINIRole (REQUIP) 1 MG tablet TAKE 1 TABLET BY MOUTH AT BEDTIME 30 tablet 1  . sertraline (ZOLOFT) 100 MG tablet TAKE 1 TABLET (100 MG TOTAL) BY MOUTH 2 (TWO) TIMES  DAILY. 180 tablet 1  . tiZANidine (ZANAFLEX) 4 MG tablet Take 1 tablet (4 mg total) by mouth every 6 (six) hours as needed for muscle spasms. 40 tablet 2  . ULTICARE INSULIN SYRINGE 31G X 5/16" 1 ML MISC USE TO INJECT INSULIN DAILY 100 each 0  . Vitamin D, Ergocalciferol, (DRISDOL) 1.25 MG (50000 UNIT) CAPS capsule TAKE 1 CAPSULE BY MOUTH EVERY 7 DAYS 4 capsule 4  . isosorbide mononitrate (IMDUR) 60 MG 24 hr tablet Take 1.5 tablets (90 mg total) by mouth daily. (Patient taking differently: Take 120 mg by mouth daily. ) 135 tablet 3   No current facility-administered medications for this visit.    Allergies:   Morphine and Shellfish-derived products    Social History:  The patient  reports that he has never smoked. He has never used smokeless tobacco. He reports that he does not drink alcohol and does not use drugs.   Family History:  The patient's family history includes Cancer in his maternal grandmother; Coronary artery disease in his brother and brother; Diabetes in his brother, brother, and mother; Heart attack in an other family member; Heart failure in his father and mother; Hypothyroidism in his brother; Irregular heart beat in his daughter; Other in his brother; Pancreatic cancer in his brother; Stroke in his brother.    ROS:  Please see the history of present illness.   Otherwise, review of systems are positive for recent fall when dog pulled him.   All other systems are reviewed and negative.    PHYSICAL EXAM: VS:  BP 110/72   Pulse 73   Ht 5\' 9"  (1.753 m)   Wt 200 lb 12.8 oz (91.1 kg)   SpO2 97%   BMI 29.65 kg/m  , BMI Body mass index is 29.65 kg/m. GEN: Well nourished, well developed, in no acute distress  HEENT: normal  Neck: no JVD, carotid bruits, or masses Cardiac: RRR; no murmurs, rubs, or gallops,no edema  Respiratory:  clear to auscultation bilaterally, normal work of  breathing GI: soft, nontender, nondistended, + BS MS: no deformity or atrophy  Skin: warm and  dry, no rash Neuro:  Strength and sensation are intact Psych: euthymic mood, full affect    Recent Labs: 08/14/2020: Hemoglobin 13.2; Platelets 231; TSH 2.11 09/15/2020: ALT 15; BUN 25; Creat 2.02; Potassium 4.6; Sodium 131   Lipid Panel    Component Value Date/Time   CHOL 118 08/14/2020 1433   CHOL 99 (L) 09/27/2017 0823   TRIG 160 (H) 08/14/2020 1433   HDL 32 (L) 08/14/2020 1433   HDL 30 (L) 09/27/2017 0823   CHOLHDL 3.7 08/14/2020 1433   VLDL 18.4 05/14/2020 0836   LDLCALC 62 08/14/2020 1433   LDLDIRECT 77.0 08/20/2019 0924     Other studies Reviewed: Additional studies/ records that were reviewed today with results demonstrating: labs reviewed.   ASSESSMENT AND PLAN:  1. CAD: No angina on medical therapy. COntinue aggressive secondary prevention.  Continue Imdur 120 mg daily.   Trying to exercise more.  Avoid falling.  2. AFib: Eliquis for stroke prevention.  3. Hyperlipidemia: LDL 62.  Continue lipid lowering therapy.  4. Obesity/DOE: Breathing improved afte this flulike illness. Lost weight with trulicity. 5. DM: A1C 7.9 in 10/21. 6. Anticoagulated: Eliquis for stroke prevention.   7. CKD: Cr 2.02, up from 1.8 in 9/21.  Recheck of labs needed in the future.  Followed with PMD.  Avoid nephrotoxins.  Using tylenol for pain. Check BMet today.    Current medicines are reviewed at length with the patient today.  The patient concerns regarding his medicines were addressed.  The following changes have been made:  No change  Labs/ tests ordered today include:  No orders of the defined types were placed in this encounter.   Recommend 150 minutes/week of aerobic exercise Low fat, low carb, high fiber diet recommended  Disposition:   FU in 1 year   Signed, Larae Grooms, MD  10/23/2020 10:30 AM    [facility] [place], [place], [place]  [address] Phone: [phone]; Fax: [phone]

## 2020-10-23 ENCOUNTER — Other Ambulatory Visit: Payer: Self-pay | Admitting: Family Medicine

## 2020-10-23 ENCOUNTER — Ambulatory Visit (INDEPENDENT_AMBULATORY_CARE_PROVIDER_SITE_OTHER): Payer: Medicare PPO | Admitting: Interventional Cardiology

## 2020-10-23 ENCOUNTER — Encounter: Payer: Self-pay | Admitting: Interventional Cardiology

## 2020-10-23 ENCOUNTER — Other Ambulatory Visit: Payer: Self-pay

## 2020-10-23 VITALS — BP 110/72 | HR 73 | Ht 69.0 in | Wt 200.8 lb

## 2020-10-23 DIAGNOSIS — I25700 Atherosclerosis of coronary artery bypass graft(s), unspecified, with unstable angina pectoris: Secondary | ICD-10-CM | POA: Diagnosis not present

## 2020-10-23 DIAGNOSIS — E785 Hyperlipidemia, unspecified: Secondary | ICD-10-CM

## 2020-10-23 DIAGNOSIS — I480 Paroxysmal atrial fibrillation: Secondary | ICD-10-CM

## 2020-10-23 DIAGNOSIS — I5032 Chronic diastolic (congestive) heart failure: Secondary | ICD-10-CM | POA: Diagnosis not present

## 2020-10-23 DIAGNOSIS — N189 Chronic kidney disease, unspecified: Secondary | ICD-10-CM

## 2020-10-23 LAB — BASIC METABOLIC PANEL
BUN/Creatinine Ratio: 11 (ref 10–24)
BUN: 22 mg/dL (ref 8–27)
CO2: 22 mmol/L (ref 20–29)
Calcium: 9.6 mg/dL (ref 8.6–10.2)
Chloride: 97 mmol/L (ref 96–106)
Creatinine, Ser: 1.97 mg/dL — ABNORMAL HIGH (ref 0.76–1.27)
GFR calc Af Amer: 38 mL/min/{1.73_m2} — ABNORMAL LOW (ref >59)
GFR calc non Af Amer: 33 mL/min/{1.73_m2} — ABNORMAL LOW (ref >59)
Glucose: 246 mg/dL — ABNORMAL HIGH (ref 65–99)
Potassium: 4.1 mmol/L (ref 3.5–5.2)
Sodium: 134 mmol/L (ref 134–144)

## 2020-10-23 MED ORDER — ISOSORBIDE MONONITRATE ER 120 MG PO TB24
120.0000 mg | ORAL_TABLET | Freq: Every day | ORAL | 3 refills | Status: DC
Start: 2020-10-23 — End: 2020-10-23

## 2020-10-23 MED FILL — ISOSORBIDE MN 120 MG TAB SA: 120 | 90 days supply | Qty: 90 | Fill #0

## 2020-10-23 MED FILL — ELIQUIS 5 MG TABLET: 5 | 30 days supply | Qty: 60 | Fill #5

## 2020-10-23 NOTE — Telephone Encounter (Signed)
Would you like Pt to continue Ergocalciferol?  

## 2020-10-23 NOTE — Patient Instructions (Signed)
Medication Instructions:  Your physician has recommended you make the following change in your medication:   TAKE: isosorbide mononitrate (imdur) 120 mg once a day  *If you need a refill on your cardiac medications before your next appointment, please call your pharmacy*   Lab Work: TODAY: BMET  If you have labs (blood work) drawn today and your tests are completely normal, you will receive your results only by: Marland Kitchen MyChart Message (if you have MyChart) OR . A paper copy in the mail If you have any lab test that is abnormal or we need to change your treatment, we will call you to review the results.   Testing/Procedures: None   Follow-Up: At [facility], you and your health needs are our priority.  As part of our continuing mission to provide you with exceptional heart care, we have created designated Provider Care Teams.  These Care Teams include your primary Cardiologist (physician) and Advanced Practice Providers (APPs -  Physician Assistants and Nurse Practitioners) who all work together to provide you with the care you need, when you need it.  We recommend signing up for the patient portal called "MyChart".  Sign up information is provided on this After Visit Summary.  MyChart is used to connect with patients for Virtual Visits (Telemedicine).  Patients are able to view lab/test results, encounter notes, upcoming appointments, etc.  Non-urgent messages can be sent to your provider as well.   To learn more about what you can do with MyChart, go to [email or web address].    Your next appointment:   12 month(s)  The format for your next appointment:   In Person  Provider:   You may see Larae Grooms, MD or one of the following Advanced Practice Providers on your designated Care Team:    Richardson Dopp, PA-C  [place], [place]    Other Instructions  High-Fiber Diet Fiber, also called dietary fiber, is a type of carbohydrate that is found in fruits, vegetables, whole  grains, and beans. A high-fiber diet can have many health benefits. Your health care provider may recommend a high-fiber diet to help:  Prevent constipation. Fiber can make your bowel movements more regular.  Lower your cholesterol.  Relieve the following conditions: ? Swelling of veins in the anus (hemorrhoids). ? Swelling and irritation (inflammation) of specific areas of the digestive tract (uncomplicated diverticulosis). ? A problem of the large intestine (colon) that sometimes causes pain and diarrhea (irritable bowel syndrome, IBS).  Prevent overeating as part of a weight-loss plan.  Prevent heart disease, type 2 diabetes, and certain cancers. What is my plan? The recommended daily fiber intake in grams (g) includes:  38 g for men age 30 or younger.  30 g for men over age 19.  56 g for women age 50 or younger.  21 g for women over age 40. You can get the recommended daily intake of dietary fiber by:  Eating a variety of fruits, vegetables, grains, and beans.  Taking a fiber supplement, if it is not possible to get enough fiber through your diet. What do I need to know about a high-fiber diet?  It is better to get fiber through food sources rather than from fiber supplements. There is not a lot of research about how effective supplements are.  Always check the fiber content on the nutrition facts label of any prepackaged food. Look for foods that contain 5 g of fiber or more per serving.  Talk with a diet and nutrition specialist (dietitian)  if you have questions about specific foods that are recommended or not recommended for your medical condition, especially if those foods are not listed below.  Gradually increase how much fiber you consume. If you increase your intake of dietary fiber too quickly, you may have bloating, cramping, or gas.  Drink plenty of water. Water helps you to digest fiber. What are tips for following this plan?  Eat a wide variety of high-fiber  foods.  Make sure that half of the grains that you eat each day are whole grains.  Eat breads and cereals that are made with whole-grain flour instead of refined flour or white flour.  Eat brown rice, bulgur wheat, or millet instead of white rice.  Start the day with a breakfast that is high in fiber, such as a cereal that contains 5 g of fiber or more per serving.  Use beans in place of meat in soups, salads, and pasta dishes.  Eat high-fiber snacks, such as berries, raw vegetables, nuts, and popcorn.  Choose whole fruits and vegetables instead of processed forms like juice or sauce. What foods can I eat?  Fruits Berries. Pears. Apples. Oranges. Avocado. Prunes and raisins. Dried figs. Vegetables Sweet potatoes. Spinach. Kale. Artichokes. Cabbage. Broccoli. Cauliflower. Green peas. Carrots. Squash. Grains Whole-grain breads. Multigrain cereal. Oats and oatmeal. Brown rice. Barley. Bulgur wheat. [place]. Quinoa. Bran muffins. Popcorn. Rye wafer crackers. Meats and other proteins Navy, kidney, and pinto beans. Soybeans. Split peas. Lentils. Nuts and seeds. Dairy Fiber-fortified yogurt. Beverages Fiber-fortified soy milk. Fiber-fortified orange juice. Other foods Fiber bars. The items listed above may not be a complete list of recommended foods and beverages. Contact a dietitian for more options. What foods are not recommended? Fruits Fruit juice. Cooked, strained fruit. Vegetables Fried potatoes. Canned vegetables. Well-cooked vegetables. Grains White bread. Pasta made with refined flour. White rice. Meats and other proteins Fatty cuts of meat. Fried chicken or fried fish. Dairy Milk. Yogurt. Cream cheese. Sour cream. Fats and oils Butters. Beverages Soft drinks. Other foods Cakes and pastries. The items listed above may not be a complete list of foods and beverages to avoid. Contact a dietitian for more information. Summary  Fiber is a type of carbohydrate. It is  found in fruits, vegetables, whole grains, and beans.  There are many health benefits of eating a high-fiber diet, such as preventing constipation, lowering blood cholesterol, helping with weight loss, and reducing your risk of heart disease, diabetes, and certain cancers.  Gradually increase your intake of fiber. Increasing too fast can result in cramping, bloating, and gas. Drink plenty of water while you increase your fiber.  The best sources of fiber include whole fruits and vegetables, whole grains, nuts, seeds, and beans. This information is not intended to replace advice given to you by your health care provider. Make sure you discuss any questions you have with your health care provider. Document Revised: 09/26/2017 Document Reviewed: 09/26/2017 Elsevier Patient Education  2020 [employer].

## 2020-10-24 MED FILL — VIT D2 1.25 MG (50,000 UNIT: 1.25 MG | 28 days supply | Qty: 4 | Fill #0

## 2020-10-27 ENCOUNTER — Other Ambulatory Visit: Payer: Self-pay | Admitting: Family Medicine

## 2020-10-27 MED FILL — RANOLAZINE ER 500 MG TABLET: 500 | 30 days supply | Qty: 60 | Fill #2

## 2020-10-27 MED FILL — SERTRALINE HCL 100 MG TABS: 100 | 90 days supply | Qty: 180 | Fill #0

## 2020-10-30 DIAGNOSIS — Z01818 Encounter for other preprocedural examination: Secondary | ICD-10-CM

## 2020-10-30 DIAGNOSIS — I480 Paroxysmal atrial fibrillation: Secondary | ICD-10-CM

## 2020-11-03 ENCOUNTER — Other Ambulatory Visit: Payer: Self-pay | Admitting: Family Medicine

## 2020-11-03 MED FILL — tiZANidine HCL 4 MG TABS: 4 | 10 days supply | Qty: 40 | Fill #2

## 2020-11-04 MED FILL — TRULICITY 1.5 MG/0.5 ML PEN: 1.5 | 28 days supply | Qty: 2 | Fill #1

## 2020-11-04 MED FILL — ULTCARE INS SYR 1 ML 31GX5/: 31G X 5/16" | 100 days supply | Qty: 100 | Fill #0

## 2020-11-06 NOTE — Telephone Encounter (Signed)
Called and spoke to patient. He is requesting a referral to the oral surgeon Dr. Enrique Sack. Referral has been placed.

## 2020-11-11 MED FILL — FUROSEMIDE 20 MG TAB: 20 | 30 days supply | Qty: 30 | Fill #2

## 2020-11-13 ENCOUNTER — Ambulatory Visit: Payer: Medicare PPO | Admitting: Family Medicine

## 2020-11-17 ENCOUNTER — Encounter: Payer: Self-pay | Admitting: Family Medicine

## 2020-11-17 ENCOUNTER — Ambulatory Visit (INDEPENDENT_AMBULATORY_CARE_PROVIDER_SITE_OTHER): Payer: Medicare PPO | Admitting: Family Medicine

## 2020-11-17 ENCOUNTER — Other Ambulatory Visit: Payer: Self-pay

## 2020-11-17 DIAGNOSIS — N289 Disorder of kidney and ureter, unspecified: Secondary | ICD-10-CM

## 2020-11-17 DIAGNOSIS — M1711 Unilateral primary osteoarthritis, right knee: Secondary | ICD-10-CM | POA: Diagnosis not present

## 2020-11-17 NOTE — Progress Notes (Signed)
[name] [name] - 74 y.o. male MRN [id number]  Date of birth: Nov 28, 1946  SUBJECTIVE:  Including CC & ROS.  Chief Complaint  Patient presents with  . Follow-up    Right knee    [name] [name] is a 74 y.o. male that is following up for his right knee pain. Notices it when he has to get up from a seated position or early in the morning.  Otherwise his pain has been well controlled.   Review of Systems See HPI   HISTORY: Past Medical, Surgical, Social, and Family History Reviewed & Updated per EMR.   Pertinent Historical Findings include:  Past Medical History:  Diagnosis Date  . Acquired atrophy of thyroid 06/05/2013   Overview:  July 2014: controlled on synthroid 20mcg since 2012 May 2015: decreased to Synthroid 50 mcg  Last Assessment & Plan:  Pt doing well with most recent TSH within normal range.  Will continue current dosage of Synthroid 51mcg. Pt reminded to take medication on empty stomach. Will continue to monitor with periodic laboratory assessment in 3 months.   . Anemia    hemoglobin 7.4, iron deficiency, January, 2011, 2 unit transfusion, endoscopy normal, capsule endoscopy February, 2011 no small bowel abnormalities.   Most likely source gastric erosions, followed by GI  . Anxiety   . Bradycardia   . CAD (coronary artery disease)    A. CABG in 2000,status post cardiac cath in 2006, 2009 ....continued chest pain and SOB despite oral medication adjestments including Ranexa. B. Cath November 2009/ mRCA - 2.75 x 23 Abbott Xience V drug-eluting stent ...11/26/2008 to distal  RCA leading to acute marginal.  C. Cath 07/2012 for CP - stable anatomy, med rx. d. cath 2015 and 05/30/2015 stable anatomy, consider Myoview if has CP again  . Carotid artery disease ([place]) 08/01/2015   Doppler, May 29, 2015, 1-39% bilateral ICA   . Cerebral ischemia    MRI November, 2010, chronic microvascular ischemia  . CKD (chronic kidney disease), stage III ([place])   . Concussion   . Depression     Bipolar  . Diabetes mellitus ([place])   . Edema   . Essential hypertension 06/05/2013   Overview:  July 2014: Controlled with Bystolic Sep 9381: Changed to atenolol.  Last Assessment & Plan:  Will change to atenolol for cost and follow Improved.  Medication compliance strongly encouraged BP: 116/64 mmHg    Overview:  July 2014: Controlled with Bystolic Sep 0175: Changed to atenolol.  Last Assessment & Plan:  Will change to atenolol for cost and follow  . Falling episodes    these have occurred in the past and again recurring 2011  . Family history of adverse reaction to anesthesia    "mother died during bypass surgery but not sure if it has to do with anesthesia"  . Gastric ulcer   . GERD (gastroesophageal reflux disease)   . H/O medication noncompliance    Due to loss of insurance  . H/O multiple concussions   . Hard of hearing   . History of blood transfusion 12/20/2013  . Hx of CABG    2000,  / one median sternotomy suture broken her chest x-ray November, 2010, no clinical significance  . Hyperlipidemia   . Hypertension    pt. denies  . Iron deficiency anemia   . Long term (current) use of anticoagulants [Z79.01] 07/20/2016  . Low back pain 06/12/2009   Qualifier: Diagnosis of  By: [name]   . Nephrolithiasis   .  Orthostasis   . OSA (obstructive sleep apnea)   . PAF (paroxysmal atrial fibrillation) ([place])    a. dx 2017, started on amiodarone/warfarin but patient intermittently noncompliant.  Marland Kitchen PSVT (paroxysmal supraventricular tachycardia) ([place])   . RBBB 07/09/2009   Qualifier: Diagnosis of  By: [name], MD, [name] [name]   . RLS (restless legs syndrome) 09/19/2009   Qualifier: Diagnosis of  By: [name]   Overview:  July 2014: Controlled with Mirapex  Last Assessment & Plan:  Patient is doing well. Will continue current management and follow clinically.  . Seizure disorder ([place]) 01/09/2017  . Spondylosis    C5-6, C6-7 MRI 2010  . Syncope 03/2016  . Thyroid disease    . Tubulovillous adenoma of colon 2007  . Vitamin D deficiency 05/17/2017  . Wears glasses     Past Surgical History:  Procedure Laterality Date  . ANTERIOR CERVICAL DECOMP/DISCECTOMY FUSION N/A 05/31/2016   Procedure: ANTERIOR CERVICAL DECOMPRESSION/DISCECTOMY FUSION CERVICAL FIVE-SIX,CERVICAL SIX-SEVEN;  Surgeon: [name], MD;  Location: [place] [facility];  Service: Neurosurgery;  Laterality: N/A;  . CARDIAC CATHETERIZATION N/A 05/30/2015   Procedure: Left Heart Cath and Coronary Angiography;  Surgeon: [name], MD;  Location: [place] [facility];  Service: Cardiovascular;  Laterality: N/A;  . COLONOSCOPY    . CORONARY ARTERY BYPASS GRAFT     2000  . ESOPHAGOGASTRODUODENOSCOPY    . LEFT HEART CATH AND CORS/GRAFTS ANGIOGRAPHY N/A 12/15/2017   Procedure: LEFT HEART CATH AND CORS/GRAFTS ANGIOGRAPHY;  Surgeon: [name], MD;  Location: [place] [facility];  Service: Cardiovascular;  Laterality: N/A;  . LEFT HEART CATHETERIZATION WITH CORONARY/GRAFT ANGIOGRAM N/A 08/01/2012   Procedure: LEFT HEART CATHETERIZATION WITH Beatrix Fetters;  Surgeon: [name], MD;  Location: [facility] CATH LAB;  Service: Cardiovascular;  Laterality: N/A;  . LEFT HEART CATHETERIZATION WITH CORONARY/GRAFT ANGIOGRAM N/A 01/03/2015   Procedure: LEFT HEART CATHETERIZATION WITH Beatrix Fetters;  Surgeon: [name], MD;  Location: [facility] CATH LAB;  Service: Cardiovascular;  Laterality: N/A;  . NASAL SEPTUM SURGERY     UP3  . PERCUTANEOUS CORONARY STENT INTERVENTION (PCI-S)  10/2008   mRCA PCI  2.75 x 23 Abbott Xience V drug-eluting stent   . RIGHT/LEFT HEART CATH AND CORONARY/GRAFT ANGIOGRAPHY N/A 01/07/2020   Procedure: RIGHT/LEFT HEART CATH AND CORONARY/GRAFT ANGIOGRAPHY;  Surgeon: [name], MD;  Location: [place] CV LAB;  Service: Cardiovascular;  Laterality: N/A;  . ULTRASOUND GUIDANCE FOR VASCULAR ACCESS  12/15/2017   Procedure: Ultrasound Guidance For Vascular Access;  Surgeon:  [name], MD;  Location: [place] CV LAB;  Service: Cardiovascular;;    Family History  Problem Relation Age of Onset  . Pancreatic cancer Brother   . Diabetes Brother   . Coronary artery disease Brother   . Stroke Brother   . Diabetes Brother   . Diabetes Mother   . Heart failure Mother   . Heart failure Father   . Hypothyroidism Brother   . Coronary artery disease Brother   . Other Brother        colon surgery  . Heart attack Other        Nephew  . Irregular heart beat Daughter   . Cancer Maternal Grandmother        unknown   . Colon cancer Neg Hx   . Stomach cancer Neg Hx   . Liver cancer Neg Hx   . Rectal cancer Neg Hx   . Esophageal cancer Neg  Hx     Social History   Socioeconomic History  . Marital status: Married    Spouse name: Not on file  . Number of children: 4  . Years of education: 63  . Highest education level: Not on file  Occupational History  . Occupation: retired  Tobacco Use  . Smoking status: Never Smoker  . Smokeless tobacco: Never Used  Vaping Use  . Vaping Use: Never used  Substance and Sexual Activity  . Alcohol use: No    Alcohol/week: 0.0 standard drinks    Comment: stopped drinking in 1998  . Drug use: No  . Sexual activity: Not on file  Other Topics Concern  . Not on file  Social History Narrative   Patient is right handed.   Patient does not drink caffeine.   Social Determinants of Health   Financial Resource Strain: Not on file  Food Insecurity: Not on file  Transportation Needs: Not on file  Physical Activity: Not on file  Stress: Not on file  Social Connections: Not on file  Intimate Partner Violence: Not on file     PHYSICAL EXAM:  VS: BP 115/72   Pulse 76   Ht 5\' 9"  (1.753 m)   Wt 195 lb (88.5 kg)   BMI 28.80 kg/m  Physical Exam Gen: NAD, alert, cooperative with exam, well-appearing    ASSESSMENT & PLAN:   OA (osteoarthritis) of knee Pain is intermittent in nature.  He is able to do  most activities with little to no pain. -Counseled on home exercise therapy and supportive care. -Would consider physical therapy. -Could consider injection.

## 2020-11-17 NOTE — Assessment & Plan Note (Signed)
Pain is intermittent in nature.  He is able to do most activities with little to no pain. -Counseled on home exercise therapy and supportive care. -Would consider physical therapy. -Could consider injection.

## 2020-11-19 ENCOUNTER — Other Ambulatory Visit: Payer: Self-pay | Admitting: Interventional Cardiology

## 2020-11-20 ENCOUNTER — Other Ambulatory Visit: Payer: Self-pay

## 2020-11-20 ENCOUNTER — Ambulatory Visit: Payer: Medicare PPO | Admitting: Family Medicine

## 2020-11-20 ENCOUNTER — Other Ambulatory Visit: Payer: Self-pay | Admitting: Endocrinology

## 2020-11-20 DIAGNOSIS — E782 Mixed hyperlipidemia: Secondary | ICD-10-CM

## 2020-11-20 DIAGNOSIS — E559 Vitamin D deficiency, unspecified: Secondary | ICD-10-CM

## 2020-11-20 DIAGNOSIS — E1165 Type 2 diabetes mellitus with hyperglycemia: Secondary | ICD-10-CM

## 2020-11-20 DIAGNOSIS — R42 Dizziness and giddiness: Secondary | ICD-10-CM | POA: Diagnosis not present

## 2020-11-20 DIAGNOSIS — I4821 Permanent atrial fibrillation: Secondary | ICD-10-CM

## 2020-11-20 DIAGNOSIS — I10 Essential (primary) hypertension: Secondary | ICD-10-CM | POA: Diagnosis not present

## 2020-11-20 DIAGNOSIS — N184 Chronic kidney disease, stage 4 (severe): Secondary | ICD-10-CM | POA: Diagnosis not present

## 2020-11-20 DIAGNOSIS — G2581 Restless legs syndrome: Secondary | ICD-10-CM

## 2020-11-20 MED FILL — NOVOLOG FLEXPEN SYRINGE: 100 | 10 days supply | Qty: 15 | Fill #0

## 2020-11-20 NOTE — Patient Instructions (Signed)

## 2020-11-23 NOTE — Assessment & Plan Note (Signed)
He is following with endocrinology and is numbers are improving. He has experienced some episodes of feeling dizzy in the morning but his sugars were normal.

## 2020-11-23 NOTE — Progress Notes (Signed)
Subjective:    Patient ID: [name] [name], male    DOB: 05/16/1946, 74 y.o.   MRN: [id number]  Chief Complaint  Patient presents with  . Hyperlipidemia    Follow up  . Dizziness    Has been having dizzy spells    HPI Patient is in today for follow up on chronic medical concerns. No recent febrile illness or hospitalizations. He notes having some episodes of feeling light headed and off balance at times usually in the morning after taking his medications. No polyuria or polydipsia. Denies CP/palp/SOB/HA/congestion/fevers/GI or GU c/o. Taking meds as prescribed  Past Medical History:  Diagnosis Date  . Acquired atrophy of thyroid 06/05/2013   Overview:  July 2014: controlled on synthroid 10mcg since 2012 May 2015: decreased to Synthroid 50 mcg  Last Assessment & Plan:  Pt doing well with most recent TSH within normal range.  Will continue current dosage of Synthroid 53mcg. Pt reminded to take medication on empty stomach. Will continue to monitor with periodic laboratory assessment in 3 months.   . Anemia    hemoglobin 7.4, iron deficiency, January, 2011, 2 unit transfusion, endoscopy normal, capsule endoscopy February, 2011 no small bowel abnormalities.   Most likely source gastric erosions, followed by GI  . Anxiety   . Bradycardia   . CAD (coronary artery disease)    A. CABG in 2000,status post cardiac cath in 2006, 2009 ....continued chest pain and SOB despite oral medication adjestments including Ranexa. B. Cath November 2009/ mRCA - 2.75 x 23 Abbott Xience V drug-eluting stent ...11/26/2008 to distal  RCA leading to acute marginal.  C. Cath 07/2012 for CP - stable anatomy, med rx. d. cath 2015 and 05/30/2015 stable anatomy, consider Myoview if has CP again  . Carotid artery disease ([place]) 08/01/2015   Doppler, May 29, 2015, 1-39% bilateral ICA   . Cerebral ischemia    MRI November, 2010, chronic microvascular ischemia  . CKD (chronic kidney disease), stage III ([place])   . Concussion    . Depression    Bipolar  . Diabetes mellitus ([place])   . Edema   . Essential hypertension 06/05/2013   Overview:  July 2014: Controlled with Bystolic Sep 7124: Changed to atenolol.  Last Assessment & Plan:  Will change to atenolol for cost and follow Improved.  Medication compliance strongly encouraged BP: 116/64 mmHg    Overview:  July 2014: Controlled with Bystolic Sep 5809: Changed to atenolol.  Last Assessment & Plan:  Will change to atenolol for cost and follow  . Falling episodes    these have occurred in the past and again recurring 2011  . Family history of adverse reaction to anesthesia    "mother died during bypass surgery but not sure if it has to do with anesthesia"  . Gastric ulcer   . GERD (gastroesophageal reflux disease)   . H/O medication noncompliance    Due to loss of insurance  . H/O multiple concussions   . Hard of hearing   . History of blood transfusion 12/20/2013  . Hx of CABG    2000,  / one median sternotomy suture broken her chest x-ray November, 2010, no clinical significance  . Hyperlipidemia   . Hypertension    pt. denies  . Iron deficiency anemia   . Long term (current) use of anticoagulants [Z79.01] 07/20/2016  . Low back pain 06/12/2009   Qualifier: Diagnosis of  By: [name]   . Nephrolithiasis   . Orthostasis   .  OSA (obstructive sleep apnea)   . PAF (paroxysmal atrial fibrillation) ([place])    a. dx 2017, started on amiodarone/warfarin but patient intermittently noncompliant.  Marland Kitchen PSVT (paroxysmal supraventricular tachycardia) ([place])   . RBBB 07/09/2009   Qualifier: Diagnosis of  By: [name], MD, [name] [name]   . RLS (restless legs syndrome) 09/19/2009   Qualifier: Diagnosis of  By: [name]   Overview:  July 2014: Controlled with Mirapex  Last Assessment & Plan:  Patient is doing well. Will continue current management and follow clinically.  . Seizure disorder ([place]) 01/09/2017  . Spondylosis    C5-6, C6-7 MRI 2010  . Syncope 03/2016  .  Thyroid disease   . Tubulovillous adenoma of colon 2007  . Vitamin D deficiency 05/17/2017  . Wears glasses     Past Surgical History:  Procedure Laterality Date  . ANTERIOR CERVICAL DECOMP/DISCECTOMY FUSION N/A 05/31/2016   Procedure: ANTERIOR CERVICAL DECOMPRESSION/DISCECTOMY FUSION CERVICAL FIVE-SIX,CERVICAL SIX-SEVEN;  Surgeon: [name], MD;  Location: [place] [facility];  Service: Neurosurgery;  Laterality: N/A;  . CARDIAC CATHETERIZATION N/A 05/30/2015   Procedure: Left Heart Cath and Coronary Angiography;  Surgeon: [name], MD;  Location: [place] [facility];  Service: Cardiovascular;  Laterality: N/A;  . COLONOSCOPY    . CORONARY ARTERY BYPASS GRAFT     2000  . ESOPHAGOGASTRODUODENOSCOPY    . LEFT HEART CATH AND CORS/GRAFTS ANGIOGRAPHY N/A 12/15/2017   Procedure: LEFT HEART CATH AND CORS/GRAFTS ANGIOGRAPHY;  Surgeon: [name], MD;  Location: [place] [facility];  Service: Cardiovascular;  Laterality: N/A;  . LEFT HEART CATHETERIZATION WITH CORONARY/GRAFT ANGIOGRAM N/A 08/01/2012   Procedure: LEFT HEART CATHETERIZATION WITH Beatrix Fetters;  Surgeon: [name], MD;  Location[facility] C[facility];  Service: Cardiovascular;  Laterality: N/A;  . LEFT HEART CATHETERIZATION WITH CORONARY/GRAFT ANGIOGRAM N/A 01/03/2015   Procedure: LEFT HEART CATHETERIZATION WITH Beatrix Fetters;  Surgeon: [name], MD;  Location: [facility] CATH LAB;  Service: Cardiovascular;  Laterality: N/A;  . NASAL SEPTUM SURGERY     UP3  . PERCUTANEOUS CORONARY STENT INTERVENTION (PCI-S)  10/2008   mRCA PCI  2.75 x 23 Abbott Xience V drug-eluting stent   . RIGHT/LEFT HEART CATH AND CORONARY/GRAFT ANGIOGRAPHY N/A 01/07/2020   Procedure: RIGHT/LEFT HEART CATH AND CORONARY/GRAFT ANGIOGRAPHY;  Surgeon: [name], MD;  Location: [place] CV LAB;  Service: Cardiovascular;  Laterality: N/A;  . ULTRASOUND GUIDANCE FOR VASCULAR ACCESS  12/15/2017   Procedure: Ultrasound Guidance For Vascular  Access;  Surgeon: [name], MD;  Location: [place] CV LAB;  Service: Cardiovascular;;    Family History  Problem Relation Age of Onset  . Pancreatic cancer Brother   . Diabetes Brother   . Coronary artery disease Brother   . Stroke Brother   . Diabetes Brother   . Diabetes Mother   . Heart failure Mother   . Heart failure Father   . Hypothyroidism Brother   . Coronary artery disease Brother   . Other Brother        colon surgery  . Heart attack Other        Nephew  . Irregular heart beat Daughter   . Cancer Maternal Grandmother        unknown   . Colon cancer Neg Hx   . Stomach cancer Neg Hx   . Liver cancer Neg Hx   . Rectal cancer Neg Hx   . Esophageal cancer Neg Hx  Social History   Socioeconomic History  . Marital status: Married    Spouse name: Not on file  . Number of children: 4  . Years of education: 60  . Highest education level: Not on file  Occupational History  . Occupation: retired  Tobacco Use  . Smoking status: Never Smoker  . Smokeless tobacco: Never Used  Vaping Use  . Vaping Use: Never used  Substance and Sexual Activity  . Alcohol use: No    Alcohol/week: 0.0 standard drinks    Comment: stopped drinking in 1998  . Drug use: No  . Sexual activity: Not on file  Other Topics Concern  . Not on file  Social History Narrative   Patient is right handed.   Patient does not drink caffeine.   Social Determinants of Health   Financial Resource Strain: Not on file  Food Insecurity: Not on file  Transportation Needs: Not on file  Physical Activity: Not on file  Stress: Not on file  Social Connections: Not on file  Intimate Partner Violence: Not on file    Outpatient Medications Prior to Visit  Medication Sig Dispense Refill  . albuterol (VENTOLIN HFA) 108 (90 Base) MCG/ACT inhaler Inhale 2 puffs into the lungs every 6 (six) hours as needed for wheezing or shortness of breath. 18 g 2  . Continuous Blood Gluc Receiver  (FREESTYLE LIBRE 14 DAY READER) DEVI For continuous Blood Glucose monitoring 1 each 0  . Continuous Blood Gluc Sensor (FREESTYLE LIBRE 14 DAY SENSOR) MISC 1 Device by Does not apply route every 14 (fourteen) days. 6 each 3  . Diclofenac Sodium (PENNSAID) 2 % SOLN Place 1 application onto the skin 2 (two) times daily. 112 g 2  . divalproex (DEPAKOTE ER) 250 MG 24 hr tablet Take 3 tablets (750 mg total) by mouth at bedtime. 180 tablet 0  . Dulaglutide (TRULICITY) 1.5 IW/5.8KD SOPN Inject 1.5 mg into the skin once a week. 6 mL 3  . ELIQUIS 5 MG TABS tablet TAKE 1 TABLET (5 MG TOTAL) BY MOUTH 2 (TWO) TIMES DAILY. 60 tablet 5  . fenofibrate (TRICOR) 145 MG tablet TAKE 1 TABLET BY MOUTH ONCE DAILY 90 tablet 1  . furosemide (LASIX) 20 MG tablet TAKE 1 TABLET BY MOUTH DAILY AS NEEDED FOR EDEMA 30 tablet 3  . glucose blood (ACCU-CHEK AVIVA) test strip Use as directed to check blood sugar 4 times daily.  DX E11.9 200 each 6  . insulin NPH Human (NOVOLIN N) 100 UNIT/ML injection Inject 0.4 mLs (40 Units total) into the skin at bedtime. 30 mL 11  . Insulin Pen Needle 31G X 8 MM MISC 1 each by Does not apply route 3 (three) times daily. E11.9 60 each 0  . isosorbide mononitrate (IMDUR) 120 MG 24 hr tablet Take 1 tablet (120 mg total) by mouth daily. 90 tablet 3  . levothyroxine (SYNTHROID) 100 MCG tablet TAKE 1 TABLET (100 MCG TOTAL) BY MOUTH DAILY. 90 tablet 1  . nitroGLYCERIN (NITROSTAT) 0.4 MG SL tablet PLACE 1 TABLET UNDER THE TONGUE EVERY 5 MINUTES AS NEEDED FOR CHEST PAIN. 25 tablet 1  . pantoprazole (PROTONIX) 40 MG tablet Take 1 tablet (40 mg total) by mouth daily. 90 tablet 3  . pramipexole (MIRAPEX) 1 MG tablet Take 0.5-1 tablets (0.5-1 mg total) by mouth at bedtime. 90 tablet 0  . ranolazine (RANEXA) 500 MG 12 hr tablet TAKE 1 TABLET BY MOUTH TWICE DAILY 60 tablet 5  . sertraline (ZOLOFT) 100 MG tablet  TAKE 1 TABLET BY MOUTH TWICE DAILY 180 tablet 1  . tiZANidine (ZANAFLEX) 4 MG tablet Take 1 tablet  (4 mg total) by mouth every 6 (six) hours as needed for muscle spasms. 40 tablet 2  . ULTICARE INSULIN SYRINGE 31G X 5/16" 1 ML MISC USE TO INJECT INSULIN DAILY 100 each 0  . Vitamin D, Ergocalciferol, (DRISDOL) 1.25 MG (50000 UNIT) CAPS capsule TAKE 1 CAPSULE BY MOUTH EVERY 7 DAYS 4 capsule 4  . amLODipine (NORVASC) 5 MG tablet TAKE 1 TABLET (5 MG TOTAL) BY MOUTH DAILY. 90 tablet 2  . atorvastatin (LIPITOR) 80 MG tablet TAKE 1 TABLET (80 MG TOTAL) BY MOUTH DAILY. 90 tablet 3  . insulin aspart (NOVOLOG FLEXPEN) 100 UNIT/ML FlexPen INJECT AS DIRECTED 3 TIMES A DAY (JUST BEFORE EACH MEAL) 70-80-115 UNITS 15 mL 1  . metoprolol succinate (TOPROL-XL) 50 MG 24 hr tablet TAKE 1 AND 1/2 TABLETS EVERY DAY FOR A TOTAL OF 75 MG 135 tablet 3  . rOPINIRole (REQUIP) 1 MG tablet TAKE 1 TABLET BY MOUTH AT BEDTIME 30 tablet 1  . amLODipine (NORVASC) 5 MG tablet Take 1 tablet (5 mg total) by mouth at bedtime. 90 tablet 2  . atorvastatin (LIPITOR) 80 MG tablet Take 1 tablet (80 mg total) by mouth at bedtime. 90 tablet 3   No facility-administered medications prior to visit.    Allergies  Allergen Reactions  . Morphine Other (See Comments)    hallucinations  . Shellfish-Derived Products Itching    ROS     Objective:    Physical Exam Vitals and nursing note reviewed.  Constitutional:      General: He is not in acute distress.    Appearance: He is well-developed and well-nourished.  HENT:     Head: Normocephalic and atraumatic.     Nose: Nose normal.  Eyes:     General:        Right eye: No discharge.        Left eye: No discharge.  Cardiovascular:     Rate and Rhythm: Normal rate and regular rhythm.     Heart sounds: Murmur heard.    Pulmonary:     Effort: Pulmonary effort is normal.     Breath sounds: Normal breath sounds.  Abdominal:     General: Bowel sounds are normal.     Palpations: Abdomen is soft.     Tenderness: There is no abdominal tenderness.  Musculoskeletal:         General: No edema.     Cervical back: Normal range of motion and neck supple.  Skin:    General: Skin is warm and dry.  Neurological:     Mental Status: He is alert and oriented to person, place, and time.  Psychiatric:        Mood and Affect: Mood and affect normal.     BP 110/62 (BP Location: Left Arm, Patient Position: Sitting, Cuff Size: Large)   Pulse 79   Temp 98.1 F (36.7 C) (Oral)   Ht 5\' 9"  (1.753 m)   Wt 202 lb 12.8 oz (92 kg)   SpO2 98%   BMI 29.95 kg/m  Wt Readings from Last 3 Encounters:  11/20/20 202 lb 12.8 oz (92 kg)  11/17/20 195 lb (88.5 kg)  10/23/20 200 lb 12.8 oz (91.1 kg)    Diabetic Foot Exam - Simple   No data filed    Lab Results  Component Value Date   WBC 7.3 08/14/2020   HGB  13.2 08/14/2020   HCT 38.7 08/14/2020   PLT 231 08/14/2020   GLUCOSE 246 (H) 10/23/2020   CHOL 118 08/14/2020   TRIG 160 (H) 08/14/2020   HDL 32 (L) 08/14/2020   LDLDIRECT 77.0 08/20/2019   LDLCALC 62 08/14/2020   ALT 15 09/15/2020   AST 17 09/15/2020   NA 134 10/23/2020   K 4.1 10/23/2020   CL 97 10/23/2020   CREATININE 1.97 (H) 10/23/2020   BUN 22 10/23/2020   CO2 22 10/23/2020   TSH 2.11 08/14/2020   PSA 3.14 08/20/2019   INR 2.7 04/23/2019   HGBA1C 7.9 (A) 09/17/2020   MICROALBUR <0.7 05/14/2019    Lab Results  Component Value Date   TSH 2.11 08/14/2020   Lab Results  Component Value Date   WBC 7.3 08/14/2020   HGB 13.2 08/14/2020   HCT 38.7 08/14/2020   MCV 89.8 08/14/2020   PLT 231 08/14/2020   Lab Results  Component Value Date   NA 134 10/23/2020   K 4.1 10/23/2020   CO2 22 10/23/2020   GLUCOSE 246 (H) 10/23/2020   BUN 22 10/23/2020   CREATININE 1.97 (H) 10/23/2020   BILITOT 0.8 09/15/2020   ALKPHOS 71 05/14/2020   AST 17 09/15/2020   ALT 15 09/15/2020   PROT 7.0 09/15/2020   ALBUMIN 4.5 05/14/2020   CALCIUM 9.6 10/23/2020   ANIONGAP 8 01/08/2020   GFR 39.06 (L) 05/14/2020   Lab Results  Component Value Date   CHOL 118  08/14/2020   Lab Results  Component Value Date   HDL 32 (L) 08/14/2020   Lab Results  Component Value Date   LDLCALC 62 08/14/2020   Lab Results  Component Value Date   TRIG 160 (H) 08/14/2020   Lab Results  Component Value Date   CHOLHDL 3.7 08/14/2020   Lab Results  Component Value Date   HGBA1C 7.9 (A) 09/17/2020       Assessment & Plan:   Problem List Items Addressed This Visit    Dizziness    Most notably in the morning after taking his meds. Will move some of his am meds to later in the day and see if that helps his symptoms      Diabetes mellitus type II, uncontrolled ([place]) (Chronic)    He is following with endocrinology and is numbers are improving. He has experienced some episodes of feeling dizzy in the morning but his sugars were normal.      Relevant Medications   atorvastatin (LIPITOR) 80 MG tablet   RLS (restless legs syndrome)    Will continue with Mirapex for ow and he will report if not an adequate dose.       Essential hypertension    Well controlled, no changes to meds. Encouraged heart healthy diet such as the DASH diet and exercise as tolerated.       Relevant Medications   amLODipine (NORVASC) 5 MG tablet   atorvastatin (LIPITOR) 80 MG tablet   HLD (hyperlipidemia)    Tolerating statin, encouraged heart healthy diet, avoid trans fats, minimize simple carbs and saturated fats. Increase exercise as tolerated      Relevant Medications   amLODipine (NORVASC) 5 MG tablet   atorvastatin (LIPITOR) 80 MG tablet   Atrial fibrillation (H[facility]) [I48.91]    Rate controlled and tolerating Eliquis      Relevant Medications   amLODipine (NORVASC) 5 MG tablet   atorvastatin (LIPITOR) 80 MG tablet   Vitamin D deficiency  Supplement and monitor      CKD (chronic kidney disease), stage IV ([place])    Hydrate and monitor         I have discontinued Develle A. Slatten's rOPINIRole. I have also changed his amLODipine and atorvastatin. Additionally,  I am having him maintain his divalproex, glucose blood, albuterol, FreeStyle Libre 14 Day Sensor, Insulin Pen Needle, fenofibrate, nitroGLYCERIN, Eliquis, pantoprazole, levothyroxine, FreeStyle Libre 14 Day Reader, tiZANidine, insulin NPH Human, ranolazine, UltiCare Insulin Syringe, furosemide, Trulicity, Pennsaid, Vitamin D (Ergocalciferol), isosorbide mononitrate, sertraline, and pramipexole.  No orders of the defined types were placed in this encounter.    Penni Homans, MD

## 2020-11-23 NOTE — Assessment & Plan Note (Signed)
Supplement and monitor 

## 2020-11-23 NOTE — Assessment & Plan Note (Addendum)
Will continue with Mirapex for ow and he will report if not an adequate dose.

## 2020-11-23 NOTE — Assessment & Plan Note (Addendum)
Rate controlled and tolerating Eliquis  

## 2020-11-23 NOTE — Assessment & Plan Note (Signed)
Most notably in the morning after taking his meds. Will move some of his am meds to later in the day and see if that helps his symptoms

## 2020-11-23 NOTE — Assessment & Plan Note (Signed)
Hydrate and monitor 

## 2020-11-23 NOTE — Assessment & Plan Note (Signed)
Well controlled, no changes to meds. Encouraged heart healthy diet such as the DASH diet and exercise as tolerated.  °

## 2020-11-23 NOTE — Assessment & Plan Note (Signed)
Tolerating statin, encouraged heart healthy diet, avoid trans fats, minimize simple carbs and saturated fats. Increase exercise as tolerated 

## 2020-11-24 ENCOUNTER — Ambulatory Visit: Payer: Medicare PPO | Admitting: Family Medicine

## 2020-11-27 ENCOUNTER — Other Ambulatory Visit: Payer: Self-pay | Admitting: Family Medicine

## 2020-11-27 ENCOUNTER — Other Ambulatory Visit: Payer: Self-pay | Admitting: Interventional Cardiology

## 2020-11-27 ENCOUNTER — Other Ambulatory Visit: Payer: Self-pay | Admitting: Endocrinology

## 2020-11-27 DIAGNOSIS — E1165 Type 2 diabetes mellitus with hyperglycemia: Secondary | ICD-10-CM

## 2020-11-27 MED FILL — RANOLAZINE ER 500 MG TABLET: 500 | 30 days supply | Qty: 60 | Fill #3

## 2020-11-27 MED FILL — FENOFIBRATE 145 MG TABS: 145 | 90 days supply | Qty: 90 | Fill #0

## 2020-11-27 MED FILL — ELIQUIS 5 MG TABLET: 5 | 30 days supply | Qty: 60 | Fill #0

## 2020-11-27 MED FILL — ULTICARE PEN NDL 8MM 31G: 31G X 8 MM | 100 days supply | Qty: 100 | Fill #0

## 2020-11-27 NOTE — Telephone Encounter (Signed)
Prescription refill request for Eliquis received.  Indication: Afib Last office visit:  [place], 10/23/2020 Scr: 1.97, 10/23/2020 Age: 74 yo Weight: 92 kg   Prescription refill sent.

## 2020-12-10 MED FILL — ATORVASTATIN 80 MG TABLET: 80 | 90 days supply | Qty: 90 | Fill #3

## 2020-12-10 MED FILL — FUROSEMIDE 20 MG TAB: 20 | 30 days supply | Qty: 30 | Fill #3

## 2020-12-10 MED FILL — VIT D2 1.25 MG (50,000 UNIT: 1.25 MG | 28 days supply | Qty: 4 | Fill #1

## 2020-12-10 MED FILL — TRULICITY 1.5 MG/0.5 ML PEN: 1.5 | 28 days supply | Qty: 2 | Fill #2

## 2020-12-19 ENCOUNTER — Other Ambulatory Visit: Payer: Self-pay | Admitting: Family Medicine

## 2020-12-19 ENCOUNTER — Telehealth: Payer: Self-pay | Admitting: Endocrinology

## 2020-12-19 DIAGNOSIS — E1165 Type 2 diabetes mellitus with hyperglycemia: Secondary | ICD-10-CM

## 2020-12-19 MED FILL — tiZANidine HCL 4 MG TABS: 4 | 10 days supply | Qty: 40 | Fill #0

## 2020-12-19 NOTE — Telephone Encounter (Signed)
Pharmacy called and stated that patient's prescription for Novolin (40 units) needs to be changed to at least 70 units. Patient told pharmacy he sometimes takes up to 70 units so 30 will not be enough.  PHARMACY:   [place], [place] - [address] Phone:  [phone]  Fax:  [phone]

## 2020-12-20 ENCOUNTER — Other Ambulatory Visit: Payer: Self-pay | Admitting: Endocrinology

## 2020-12-20 MED ORDER — INSULIN NPH (HUMAN) (ISOPHANE) 100 UNIT/ML ~~LOC~~ SUSP
70.0000 [IU] | Freq: Every day | SUBCUTANEOUS | 0 refills | Status: DC
Start: 2020-12-20 — End: 2020-12-24

## 2020-12-20 NOTE — Telephone Encounter (Signed)
Please advise 

## 2020-12-20 NOTE — Telephone Encounter (Signed)
Ok, I have sent a prescription to your pharmacy, to get you to your appt next week.

## 2020-12-24 ENCOUNTER — Ambulatory Visit (INDEPENDENT_AMBULATORY_CARE_PROVIDER_SITE_OTHER): Payer: Medicare PPO | Admitting: Endocrinology

## 2020-12-24 ENCOUNTER — Encounter: Payer: Self-pay | Admitting: Endocrinology

## 2020-12-24 ENCOUNTER — Other Ambulatory Visit: Payer: Self-pay | Admitting: Endocrinology

## 2020-12-24 ENCOUNTER — Other Ambulatory Visit: Payer: Self-pay

## 2020-12-24 VITALS — BP 118/82 | HR 64 | Wt 203.0 lb

## 2020-12-24 DIAGNOSIS — E1165 Type 2 diabetes mellitus with hyperglycemia: Secondary | ICD-10-CM | POA: Diagnosis not present

## 2020-12-24 LAB — POCT GLYCOSYLATED HEMOGLOBIN (HGB A1C): Hemoglobin A1C: 7.9 % — AB (ref 4.0–5.6)

## 2020-12-24 MED ORDER — OZEMPIC (1 MG/DOSE) 2 MG/1.5ML ~~LOC~~ SOPN: 1.0000 mg | PEN_INJECTOR | SUBCUTANEOUS | 3 refills | Status: DC

## 2020-12-24 MED ORDER — INSULIN NPH (HUMAN) (ISOPHANE) 100 UNIT/ML ~~LOC~~ SUSP: 40.0000 [IU] | Freq: Every day | SUBCUTANEOUS | 11 refills | Status: DC

## 2020-12-24 MED ORDER — NOVOLOG FLEXPEN 100 UNIT/ML ~~LOC~~ SOPN
PEN_INJECTOR | SUBCUTANEOUS | 11 refills | Status: DC
Start: 2020-12-24 — End: 2021-02-25

## 2020-12-24 NOTE — Progress Notes (Signed)
Subjective:    Patient ID: [name] [name], male    DOB: 1946/03/01, 75 y.o.   MRN: [id number]  HPI Pt returns for f/u of diabetes mellitus:  DM type: Insulin-requiring type 2.  Dx'ed: 6712 Complications: PN, stage 3b CRI, CAD and PAD.   Therapy: insulin since 4580, and Trulicity.   DKA: never Severe hypoglycemia: never Pancreatitis: never Pancreatic imaging: 2017 CT showed fatty replacement of the pancreatic head and body.   SDOH: HS insulin is NPH, due to cost.   Other: he takes multiple daily injections; he is not using continuous glucose monitor.   Interval history: no cbg record, but states cbg varies from 68-300.  It is in general higher as the day goes on.  He says Trulicity is too expensive.  He says NPH is 50 units QHS.  He takes Novolog as rx'ed.   Past Medical History:  Diagnosis Date  . Acquired atrophy of thyroid 06/05/2013   Overview:  July 2014: controlled on synthroid 42mcg since 2012 May 2015: decreased to Synthroid 50 mcg  Last Assessment & Plan:  Pt doing well with most recent TSH within normal range.  Will continue current dosage of Synthroid 1mcg. Pt reminded to take medication on empty stomach. Will continue to monitor with periodic laboratory assessment in 3 months.   . Anemia    hemoglobin 7.4, iron deficiency, January, 2011, 2 unit transfusion, endoscopy normal, capsule endoscopy February, 2011 no small bowel abnormalities.   Most likely source gastric erosions, followed by GI  . Anxiety   . Bradycardia   . CAD (coronary artery disease)    A. CABG in 2000,status post cardiac cath in 2006, 2009 ....continued chest pain and SOB despite oral medication adjestments including Ranexa. B. Cath November 2009/ mRCA - 2.75 x 23 Abbott Xience V drug-eluting stent ...11/26/2008 to distal  RCA leading to acute marginal.  C. Cath 07/2012 for CP - stable anatomy, med rx. d. cath 2015 and 05/30/2015 stable anatomy, consider Myoview if has CP again  . Carotid artery disease ([place])  08/01/2015   Doppler, May 29, 2015, 1-39% bilateral ICA   . Cerebral ischemia    MRI November, 2010, chronic microvascular ischemia  . CKD (chronic kidney disease), stage III ([place])   . Concussion   . Depression    Bipolar  . Diabetes mellitus ([place])   . Edema   . Essential hypertension 06/05/2013   Overview:  July 2014: Controlled with Bystolic Sep 9983: Changed to atenolol.  Last Assessment & Plan:  Will change to atenolol for cost and follow Improved.  Medication compliance strongly encouraged BP: 116/64 mmHg    Overview:  July 2014: Controlled with Bystolic Sep 3825: Changed to atenolol.  Last Assessment & Plan:  Will change to atenolol for cost and follow  . Falling episodes    these have occurred in the past and again recurring 2011  . Family history of adverse reaction to anesthesia    "mother died during bypass surgery but not sure if it has to do with anesthesia"  . Gastric ulcer   . GERD (gastroesophageal reflux disease)   . H/O medication noncompliance    Due to loss of insurance  . H/O multiple concussions   . Hard of hearing   . History of blood transfusion 12/20/2013  . Hx of CABG    2000,  / one median sternotomy suture broken her chest x-ray November, 2010, no clinical significance  . Hyperlipidemia   . Hypertension  pt. denies  . Iron deficiency anemia   . Long term (current) use of anticoagulants [Z79.01] 07/20/2016  . Low back pain 06/12/2009   Qualifier: Diagnosis of  By: [name]   . Nephrolithiasis   . Orthostasis   . OSA (obstructive sleep apnea)   . PAF (paroxysmal atrial fibrillation) ([place])    a. dx 2017, started on amiodarone/warfarin but patient intermittently noncompliant.  Marland Kitchen PSVT (paroxysmal supraventricular tachycardia) ([place])   . RBBB 07/09/2009   Qualifier: Diagnosis of  By: [name], MD, [name] [name]   . RLS (restless legs syndrome) 09/19/2009   Qualifier: Diagnosis of  By: [name]   Overview:  July 2014: Controlled with Mirapex   Last Assessment & Plan:  Patient is doing well. Will continue current management and follow clinically.  . Seizure disorder ([place]) 01/09/2017  . Spondylosis    C5-6, C6-7 MRI 2010  . Syncope 03/2016  . Thyroid disease   . Tubulovillous adenoma of colon 2007  . Vitamin D deficiency 05/17/2017  . Wears glasses     Past Surgical History:  Procedure Laterality Date  . ANTERIOR CERVICAL DECOMP/DISCECTOMY FUSION N/A 05/31/2016   Procedure: ANTERIOR CERVICAL DECOMPRESSION/DISCECTOMY FUSION CERVICAL FIVE-SIX,CERVICAL SIX-SEVEN;  Surgeon: [name], MD;  Location: [place] [facility];  Service: Neurosurgery;  Laterality: N/A;  . CARDIAC CATHETERIZATION N/A 05/30/2015   Procedure: Left Heart Cath and Coronary Angiography;  Surgeon: [name], MD;  Location: [place] [facility];  Service: Cardiovascular;  Laterality: N/A;  . COLONOSCOPY    . CORONARY ARTERY BYPASS GRAFT     2000  . ESOPHAGOGASTRODUODENOSCOPY    . LEFT HEART CATH AND CORS/GRAFTS ANGIOGRAPHY N/A 12/15/2017   Procedure: LEFT HEART CATH AND CORS/GRAFTS ANGIOGRAPHY;  Surgeon: [name], MD;  Location: [place] [facility];  Service: Cardiovascular;  Laterality: N/A;  . LEFT HEART CATHETERIZATION WITH CORONARY/GRAFT ANGIOGRAM N/A 08/01/2012   Procedure: LEFT HEART CATHETERIZATION WITH Beatrix Fetters;  Surgeon: [name], MD;  Location: [facility] CATH LAB;  Service: Cardiovascular;  Laterality: N/A;  . LEFT HEART CATHETERIZATION WITH CORONARY/GRAFT ANGIOGRAM N/A 01/03/2015   Procedure: LEFT HEART CATHETERIZATION WITH Beatrix Fetters;  Surgeon: [name], MD;  Location[facility] CATH LAB;  Service: Cardiovascular;  Laterality: N/A;  . NASAL SEPTUM SURGERY     UP3  . PERCUTANEOUS CORONARY STENT INTERVENTION (PCI-S)  10/2008   mRCA PCI  2.75 x 23 Abbott Xience V drug-eluting stent   . RIGHT/LEFT HEART CATH AND CORONARY/GRAFT ANGIOGRAPHY N/A 01/07/2020   Procedure: RIGHT/LEFT HEART CATH AND CORONARY/GRAFT ANGIOGRAPHY;   Surgeon: [name], MD;  Location: [place] CV LAB;  Service: Cardiovascular;  Laterality: N/A;  . ULTRASOUND GUIDANCE FOR VASCULAR ACCESS  12/15/2017   Procedure: Ultrasound Guidance For Vascular Access;  Surgeon: [name], MD;  Location: [place] [facility] LAB;  Service: Cardiovascular;;    Social History   Socioeconomic History  . Marital status: Married    Spouse name: Not on file  . Number of children: 4  . Years of education: 91  . Highest education level: Not on file  Occupational History  . Occupation: retired  Tobacco Use  . Smoking status: Never Smoker  . Smokeless tobacco: Never Used  Vaping Use  . Vaping Use: Never used  Substance and Sexual Activity  . Alcohol use: No    Alcohol/week: 0.0 standard drinks    Comment: stopped drinking in 1998  . Drug use: No  . Sexual  activity: Not on file  Other Topics Concern  . Not on file  Social History Narrative   Patient is right handed.   Patient does not drink caffeine.   Social Determinants of Health   Financial Resource Strain: Not on file  Food Insecurity: Not on file  Transportation Needs: Not on file  Physical Activity: Not on file  Stress: Not on file  Social Connections: Not on file  Intimate Partner Violence: Not on file    Current Outpatient Medications on File Prior to Visit  Medication Sig Dispense Refill  . albuterol (VENTOLIN HFA) 108 (90 Base) MCG/ACT inhaler Inhale 2 puffs into the lungs every 6 (six) hours as needed for wheezing or shortness of breath. 18 g 2  . amLODipine (NORVASC) 5 MG tablet Take 1 tablet (5 mg total) by mouth at bedtime. 90 tablet 2  . atorvastatin (LIPITOR) 80 MG tablet Take 1 tablet (80 mg total) by mouth at bedtime. 90 tablet 3  . Continuous Blood Gluc Receiver (FREESTYLE LIBRE 14 DAY READER) DEVI For continuous Blood Glucose monitoring 1 each 0  . Continuous Blood Gluc Sensor (FREESTYLE LIBRE 14 DAY SENSOR) MISC 1 Device by Does not apply route every 14  (fourteen) days. 6 each 3  . Diclofenac Sodium (PENNSAID) 2 % SOLN Place 1 application onto the skin 2 (two) times daily. 112 g 2  . divalproex (DEPAKOTE ER) 250 MG 24 hr tablet Take 3 tablets (750 mg total) by mouth at bedtime. 180 tablet 0  . ELIQUIS 5 MG TABS tablet TAKE 1 TABLET BY MOUTH TWICE DAILY 60 tablet 5  . fenofibrate (TRICOR) 145 MG tablet Take 1 tablet (145 mg total) by mouth daily. 90 tablet 1  . furosemide (LASIX) 20 MG tablet TAKE 1 TABLET BY MOUTH DAILY AS NEEDED FOR EDEMA 30 tablet 3  . glucose blood (ACCU-CHEK AVIVA) test strip Use as directed to check blood sugar 4 times daily.  DX E11.9 200 each 6  . isosorbide mononitrate (IMDUR) 120 MG 24 hr tablet Take 1 tablet (120 mg total) by mouth daily. 90 tablet 3  . levothyroxine (SYNTHROID) 100 MCG tablet TAKE 1 TABLET (100 MCG TOTAL) BY MOUTH DAILY. 90 tablet 1  . metoprolol succinate (TOPROL-XL) 50 MG 24 hr tablet TAKE 1 AND 1/2 TABLETS EVERY DAY FOR A TOTAL OF 75 MG 135 tablet 3  . nitroGLYCERIN (NITROSTAT) 0.4 MG SL tablet PLACE 1 TABLET UNDER THE TONGUE EVERY 5 MINUTES AS NEEDED FOR CHEST PAIN. 25 tablet 1  . pantoprazole (PROTONIX) 40 MG tablet Take 1 tablet (40 mg total) by mouth daily. 90 tablet 3  . pramipexole (MIRAPEX) 1 MG tablet Take 0.5-1 tablets (0.5-1 mg total) by mouth at bedtime. 90 tablet 0  . ranolazine (RANEXA) 500 MG 12 hr tablet TAKE 1 TABLET BY MOUTH TWICE DAILY 60 tablet 5  . sertraline (ZOLOFT) 100 MG tablet TAKE 1 TABLET BY MOUTH TWICE DAILY 180 tablet 1  . tiZANidine (ZANAFLEX) 4 MG tablet Take 1 tablet (4 mg total) by mouth every 6 (six) hours as needed for muscle spasms. 40 tablet 2  . ULTICARE INSULIN SYRINGE 31G X 5/16" 1 ML MISC USE TO INJECT INSULIN DAILY 100 each 0  . ULTICARE SHORT PEN NEEDLES 31G X 8 MM MISC USE TO CHECK BLOOD SUGAR THREE TIME DAILY 100 each 5  . Vitamin D, Ergocalciferol, (DRISDOL) 1.25 MG (50000 UNIT) CAPS capsule TAKE 1 CAPSULE BY MOUTH EVERY 7 DAYS 4 capsule 4   No current  facility-administered medications on file prior to visit.    Allergies  Allergen Reactions  . Morphine Other (See Comments)    hallucinations  . Shellfish-Derived Products Itching    Family History  Problem Relation Age of Onset  . Pancreatic cancer Brother   . Diabetes Brother   . Coronary artery disease Brother   . Stroke Brother   . Diabetes Brother   . Diabetes Mother   . Heart failure Mother   . Heart failure Father   . Hypothyroidism Brother   . Coronary artery disease Brother   . Other Brother        colon surgery  . Heart attack Other        Nephew  . Irregular heart beat Daughter   . Cancer Maternal Grandmother        unknown   . Colon cancer Neg Hx   . Stomach cancer Neg Hx   . Liver cancer Neg Hx   . Rectal cancer Neg Hx   . Esophageal cancer Neg Hx     BP 118/82   Pulse 64   Wt 203 lb (92.1 kg)   SpO2 97%   BMI 29.98 kg/m    Review of Systems Denies LOC.      Objective:   Physical Exam VITAL SIGNS:  See vs page GENERAL: no distress Pulses: right dorsalis pedis intact MSK: no deformity of the right foot CV: 1+ right leg edema.   Skin:  no ulcer on the feet.  normal color and temp on the feet Old healed surgical scar (vein harvest), on the right leg.   Neuro: sensation is intact to touch on the right foot.    Lab Results  Component Value Date   HGBA1C 7.9 (A) 12/24/2020       Assessment & Plan:  Insulin-requiring type 2 DM, with PAD Hypoglycemia, due to insulin: we'll try to favor GLP rx over insulin   Patient Instructions  check your blood sugar twice a day.  vary the time of day when you check, between before the 3 meals, and at bedtime.  also check if you have symptoms of your blood sugar being too high or too low.  please keep a record of the readings and bring it to your next appointment here (or you can bring the meter itself).  You can write it on any piece of paper.  please call us sooner if your blood sugar goes below 70, or if  you have a lot of readings over 200. Please continue the same insulins, and: I have sent a prescription to your pharmacy, to change the Trulicity to Ozempic, to see if that is cheaper.   Please also change the insulins to the numbers listed below.  Please come back for a follow-up appointment in 2 months.

## 2020-12-24 NOTE — Patient Instructions (Addendum)
check your blood sugar twice a day.  vary the time of day when you check, between before the 3 meals, and at bedtime.  also check if you have symptoms of your blood sugar being too high or too low.  please keep a record of the readings and bring it to your next appointment here (or you can bring the meter itself).  You can write it on any piece of paper.  please call us sooner if your blood sugar goes below 70, or if you have a lot of readings over 200. Please continue the same insulins, and: I have sent a prescription to your pharmacy, to change the Trulicity to Ozempic, to see if that is cheaper.   Please also change the insulins to the numbers listed below.  Please come back for a follow-up appointment in 2 months.

## 2020-12-30 MED FILL — ELIQUIS 5 MG TABLET: 5 | 30 days supply | Qty: 60 | Fill #1

## 2020-12-30 MED FILL — RANOLAZINE ER 500 MG TABLET: 500 | 30 days supply | Qty: 60 | Fill #4

## 2020-12-30 MED FILL — NOVOLOG FLEXPEN SYRINGE: 100 | 26 days supply | Qty: 60 | Fill #0

## 2021-01-08 ENCOUNTER — Other Ambulatory Visit: Payer: Self-pay

## 2021-01-08 ENCOUNTER — Ambulatory Visit (HOSPITAL_BASED_OUTPATIENT_CLINIC_OR_DEPARTMENT_OTHER)
Admission: RE | Admit: 2021-01-08 | Discharge: 2021-01-08 | Disposition: A | Discharge status: Home / Self Care | Payer: Medicare PPO | Source: Ambulatory Visit | Attending: Family Medicine | Admitting: Family Medicine

## 2021-01-08 ENCOUNTER — Ambulatory Visit (INDEPENDENT_AMBULATORY_CARE_PROVIDER_SITE_OTHER): Payer: Medicare PPO | Admitting: Family Medicine

## 2021-01-08 ENCOUNTER — Ambulatory Visit: Payer: Self-pay

## 2021-01-08 VITALS — BP 120/62 | Ht 69.0 in | Wt 203.0 lb

## 2021-01-08 DIAGNOSIS — M25561 Pain in right knee: Secondary | ICD-10-CM | POA: Diagnosis not present

## 2021-01-08 DIAGNOSIS — S93492A Sprain of other ligament of left ankle, initial encounter: Secondary | ICD-10-CM | POA: Insufficient documentation

## 2021-01-08 DIAGNOSIS — M1711 Unilateral primary osteoarthritis, right knee: Secondary | ICD-10-CM

## 2021-01-08 DIAGNOSIS — S99912A Unspecified injury of left ankle, initial encounter: Secondary | ICD-10-CM | POA: Diagnosis not present

## 2021-01-08 NOTE — Assessment & Plan Note (Signed)
Injury occurred a few days ago.  Seems more likely a sprain.  No fracture appreciated ultrasound. -Counseled on home exercise therapy and supportive care. -Lace up ankle brace. -X-ray.

## 2021-01-08 NOTE — Progress Notes (Signed)
[name] [name] - 75 y.o. male MRN [id number]  Date of birth: 1946/08/13  SUBJECTIVE:  Including CC & ROS.  No chief complaint on file.   [name] [name] is a 75 y.o. male that is presenting with an acute right knee pain and left ankle pain.  He had a fall a few days ago when it was icy.  He landed on his right knee.  He had swelling.  He has pain with ambulation.  The left ankle is also having some pain.  Denies any swelling redness Thank you.   Review of Systems See HPI   HISTORY: Past Medical, Surgical, Social, and Family History Reviewed & Updated per EMR.   Pertinent Historical Findings include:  Past Medical History:  Diagnosis Date  . Acquired atrophy of thyroid 06/05/2013   Overview:  July 2014: controlled on synthroid 30mcg since 2012 May 2015: decreased to Synthroid 50 mcg  Last Assessment & Plan:  Pt doing well with most recent TSH within normal range.  Will continue current dosage of Synthroid 36mcg. Pt reminded to take medication on empty stomach. Will continue to monitor with periodic laboratory assessment in 3 months.   . Anemia    hemoglobin 7.4, iron deficiency, January, 2011, 2 unit transfusion, endoscopy normal, capsule endoscopy February, 2011 no small bowel abnormalities.   Most likely source gastric erosions, followed by GI  . Anxiety   . Bradycardia   . CAD (coronary artery disease)    A. CABG in 2000,status post cardiac cath in 2006, 2009 ....continued chest pain and SOB despite oral medication adjestments including Ranexa. B. Cath November 2009/ mRCA - 2.75 x 23 Abbott Xience V drug-eluting stent ...11/26/2008 to distal  RCA leading to acute marginal.  C. Cath 07/2012 for CP - stable anatomy, med rx. d. cath 2015 and 05/30/2015 stable anatomy, consider Myoview if has CP again  . Carotid artery disease ([place]) 08/01/2015   Doppler, May 29, 2015, 1-39% bilateral ICA   . Cerebral ischemia    MRI November, 2010, chronic microvascular ischemia  . CKD (chronic kidney  disease), stage III ([place])   . Concussion   . Depression    Bipolar  . Diabetes mellitus ([place])   . Edema   . Essential hypertension 06/05/2013   Overview:  July 2014: Controlled with Bystolic Sep 5726: Changed to atenolol.  Last Assessment & Plan:  Will change to atenolol for cost and follow Improved.  Medication compliance strongly encouraged BP: 116/64 mmHg    Overview:  July 2014: Controlled with Bystolic Sep 2035: Changed to atenolol.  Last Assessment & Plan:  Will change to atenolol for cost and follow  . Falling episodes    these have occurred in the past and again recurring 2011  . Family history of adverse reaction to anesthesia    "mother died during bypass surgery but not sure if it has to do with anesthesia"  . Gastric ulcer   . GERD (gastroesophageal reflux disease)   . H/O medication noncompliance    Due to loss of insurance  . H/O multiple concussions   . Hard of hearing   . History of blood transfusion 12/20/2013  . Hx of CABG    2000,  / one median sternotomy suture broken her chest x-ray November, 2010, no clinical significance  . Hyperlipidemia   . Hypertension    pt. denies  . Iron deficiency anemia   . Long term (current) use of anticoagulants [Z79.01] 07/20/2016  . Low back pain 06/12/2009  Qualifier: Diagnosis of  By: [name]   . Nephrolithiasis   . Orthostasis   . OSA (obstructive sleep apnea)   . PAF (paroxysmal atrial fibrillation) ([place])    a. dx 2017, started on amiodarone/warfarin but patient intermittently noncompliant.  Marland Kitchen PSVT (paroxysmal supraventricular tachycardia) ([place])   . RBBB 07/09/2009   Qualifier: Diagnosis of  By: [name], MD, [name] [name]   . RLS (restless legs syndrome) 09/19/2009   Qualifier: Diagnosis of  By: [name]   Overview:  July 2014: Controlled with Mirapex  Last Assessment & Plan:  Patient is doing well. Will continue current management and follow clinically.  . Seizure disorder ([place]) 01/09/2017  . Spondylosis     C5-6, C6-7 MRI 2010  . Syncope 03/2016  . Thyroid disease   . Tubulovillous adenoma of colon 2007  . Vitamin D deficiency 05/17/2017  . Wears glasses     Past Surgical History:  Procedure Laterality Date  . ANTERIOR CERVICAL DECOMP/DISCECTOMY FUSION N/A 05/31/2016   Procedure: ANTERIOR CERVICAL DECOMPRESSION/DISCECTOMY FUSION CERVICAL FIVE-SIX,CERVICAL SIX-SEVEN;  Surgeon: [name], MD;  Location: [place] [facility];  Service: Neurosurgery;  Laterality: N/A;  . CARDIAC CATHETERIZATION N/A 05/30/2015   Procedure: Left Heart Cath and Coronary Angiography;  Surgeon: [name], MD;  Location: [place] [facility];  Service: Cardiovascular;  Laterality: N/A;  . COLONOSCOPY    . CORONARY ARTERY BYPASS GRAFT     2000  . ESOPHAGOGASTRODUODENOSCOPY    . LEFT HEART CATH AND CORS/GRAFTS ANGIOGRAPHY N/A 12/15/2017   Procedure: LEFT HEART CATH AND CORS/GRAFTS ANGIOGRAPHY;  Surgeon: [name], MD;  Location: [place] CV LAB;  Service: Cardiovascular;  Laterality: N/A;  . LEFT HEART CATHETERIZATION WITH CORONARY/GRAFT ANGIOGRAM N/A 08/01/2012   Procedure: LEFT HEART CATHETERIZATION WITH Beatrix Fetters;  Surgeon: [name], MD;  Location: [facility] CATH LAB;  Service: Cardiovascular;  Laterality: N/A;  . LEFT HEART CATHETERIZATION WITH CORONARY/GRAFT ANGIOGRAM N/A 01/03/2015   Procedure: LEFT HEART CATHETERIZATION WITH Beatrix Fetters;  Surgeon: [name], MD;  Location: [facility] CATH LAB;  Service: Cardiovascular;  Laterality: N/A;  . NASAL SEPTUM SURGERY     UP3  . PERCUTANEOUS CORONARY STENT INTERVENTION (PCI-S)  10/2008   mRCA PCI  2.75 x 23 Abbott Xience V drug-eluting stent   . RIGHT/LEFT HEART CATH AND CORONARY/GRAFT ANGIOGRAPHY N/A 01/07/2020   Procedure: RIGHT/LEFT HEART CATH AND CORONARY/GRAFT ANGIOGRAPHY;  Surgeon: [name], MD;  Location: [place] CV LAB;  Service: Cardiovascular;  Laterality: N/A;  . ULTRASOUND GUIDANCE FOR VASCULAR ACCESS  12/15/2017    Procedure: Ultrasound Guidance For Vascular Access;  Surgeon: [name], MD;  Location: [place] CV LAB;  Service: Cardiovascular;;    Family History  Problem Relation Age of Onset  . Pancreatic cancer Brother   . Diabetes Brother   . Coronary artery disease Brother   . Stroke Brother   . Diabetes Brother   . Diabetes Mother   . Heart failure Mother   . Heart failure Father   . Hypothyroidism Brother   . Coronary artery disease Brother   . Other Brother        colon surgery  . Heart attack Other        Nephew  . Irregular heart beat Daughter   . Cancer Maternal Grandmother        unknown   . Colon cancer Neg Hx   . Stomach cancer Neg Hx   . Liver  cancer Neg Hx   . Rectal cancer Neg Hx   . Esophageal cancer Neg Hx     Social History   Socioeconomic History  . Marital status: Married    Spouse name: Not on file  . Number of children: 4  . Years of education: 34  . Highest education level: Not on file  Occupational History  . Occupation: retired  Tobacco Use  . Smoking status: Never Smoker  . Smokeless tobacco: Never Used  Vaping Use  . Vaping Use: Never used  Substance and Sexual Activity  . Alcohol use: No    Alcohol/week: 0.0 standard drinks    Comment: stopped drinking in 1998  . Drug use: No  . Sexual activity: Not on file  Other Topics Concern  . Not on file  Social History Narrative   Patient is right handed.   Patient does not drink caffeine.   Social Determinants of Health   Financial Resource Strain: Not on file  Food Insecurity: Not on file  Transportation Needs: Not on file  Physical Activity: Not on file  Stress: Not on file  Social Connections: Not on file  Intimate Partner Violence: Not on file     PHYSICAL EXAM:  VS: BP 120/62 (BP Location: Left Arm, Patient Position: Sitting, Cuff Size: Normal)   Ht 5\' 9"  (1.753 m)   Wt 203 lb (92.1 kg)   BMI 29.98 kg/m  Physical Exam Gen: NAD, alert, cooperative with exam,  well-appearing MSK:  Right knee: Mild effusion. Normal range of motion. Tenderness palpation of medial joint space. Left ankle: No swelling or ecchymosis. Normal range of motion. Tenderness palpation over the lateral aspect of the talus. Neurovascular intact  Limited ultrasound: Right knee, left ankle:  Right knee: Mild to moderate effusion. Normal-appearing quadricep patellar tendon. Degenerative changes of the medial meniscus. Moderate medial joint space narrowing. Hyperemia associated with the medial compartment as well as the proximal tibia.  Likely indicates a bony contusion of the proximal tibial plateau.  Left ankle: No ankle effusion. No change of the distal fibula. Mild increased hyperemia over the ATFL. Increased hyperemia of the lateral talus.   Summary: Right knee suggestive of acute exacerbation of degenerative changes and bony contusion of proximal tibial plateau.  Left ankle with ankle sprain.  Ultrasound and interpretation by Clearance Coots, MD    ASSESSMENT & PLAN:   OA (osteoarthritis) of knee Acute exacerbation of his underlying degenerative changes with effusion on exam.  Does have increased uptake at the proximal tibial plateau to suggest a bony contusion as well. -Counseled on home exercise therapy and supportive care. -Hinged knee brace. -X-ray. -Could consider aspiration and injection.  Could consider physical therapy. -Counseled on partial weightbearing on the right side.  Sprain of anterior talofibular ligament of left ankle Injury occurred a few days ago.  Seems more likely a sprain.  No fracture appreciated ultrasound. -Counseled on home exercise therapy and supportive care. -Lace up ankle brace. -X-ray.

## 2021-01-08 NOTE — Assessment & Plan Note (Addendum)
Acute exacerbation of his underlying degenerative changes with effusion on exam.  Does have increased uptake at the proximal tibial plateau to suggest a bony contusion as well. -Counseled on home exercise therapy and supportive care. -Hinged knee brace. -X-ray. -Could consider aspiration and injection.  Could consider physical therapy. -Counseled on partial weightbearing on the right side.

## 2021-01-08 NOTE — Patient Instructions (Signed)
Good to see you Please limit the weight bearing on the right knee  Please try the brace  Please try the wrap on the ankle  Please try range of motion of the knee  Please try the rehab exercises for the ankle  I will call with the results from today   Please send me a message in MyChart with any questions or updates.  Please see me back in 2-3 weeks.   --Dr. Raeford Razor

## 2021-01-09 ENCOUNTER — Telehealth: Payer: Self-pay | Admitting: Family Medicine

## 2021-01-09 NOTE — Telephone Encounter (Signed)
Left VM for patient. If he calls back please have him speak with a nurse/CMA and inform that his ankle xray shows a possible avulsion fracture. If the lace up brace is enough, we can try an aircast for him. His knee xrays shows degenerative changes.   If any questions then please take the best time and phone number to call and I will try to call him back.   Rosemarie Ax, MD [facility] 01/09/2021, 9:27 AM

## 2021-01-12 ENCOUNTER — Other Ambulatory Visit: Payer: Self-pay | Admitting: Family Medicine

## 2021-01-12 MED FILL — VIT D2 1.25 MG (50,000 UNIT: 1.25 MG | 28 days supply | Qty: 4 | Fill #2

## 2021-01-13 ENCOUNTER — Other Ambulatory Visit: Payer: Self-pay | Admitting: Family Medicine

## 2021-01-13 MED FILL — LEVOTHYROXINE SODIUM 100 MC: 100 | 90 days supply | Qty: 90 | Fill #0

## 2021-01-22 ENCOUNTER — Ambulatory Visit (INDEPENDENT_AMBULATORY_CARE_PROVIDER_SITE_OTHER): Payer: Medicare PPO | Admitting: Family Medicine

## 2021-01-22 ENCOUNTER — Ambulatory Visit: Payer: Self-pay

## 2021-01-22 ENCOUNTER — Other Ambulatory Visit: Payer: Self-pay

## 2021-01-22 DIAGNOSIS — M1711 Unilateral primary osteoarthritis, right knee: Secondary | ICD-10-CM

## 2021-01-22 DIAGNOSIS — S93492D Sprain of other ligament of left ankle, subsequent encounter: Secondary | ICD-10-CM | POA: Diagnosis not present

## 2021-01-22 MED ORDER — TRIAMCINOLONE ACETONIDE 40 MG/ML IJ SUSP
40.0000 mg | Freq: Once | INTRAMUSCULAR | Status: AC
  Administered 2021-01-22: 40 mg via INTRA_ARTICULAR

## 2021-01-22 NOTE — Patient Instructions (Signed)
Good to see you Please try ice  Please try the get up exercises with the rolling walker  Please use the rolling walker if you feel unstable   Please send me a message in MyChart with any questions or updates.  Please see me back in 4 weeks.   --Dr. Raeford Razor

## 2021-01-22 NOTE — Assessment & Plan Note (Signed)
Did have findings on x-ray that could represent an avulsion fracture.  This has been doing well for him however.  He is doing well with the brace. -Counseled on home exercise therapy and supportive care. -Continue ASO. -Could consider physical therapy.

## 2021-01-22 NOTE — Assessment & Plan Note (Addendum)
Pain is limiting with his functionality.. Recent xray was reassuring.  - injection today  - could consider PT.  - counseled on home exercise therapy and supportive care

## 2021-01-22 NOTE — Progress Notes (Signed)
[name] [name] - 75 y.o. male MRN [id number]  Date of birth: 05-Mar-1946  SUBJECTIVE:  Including CC & ROS.  No chief complaint on file.   [name] [name] is a 75 y.o. male that is presenting with worsening of his right knee pain.  His left ankle has been doing well.  The knee pain is causing him to feel off balance.  He has significant pain when he first gets up from a seated position or lying down.   Review of Systems See HPI   HISTORY: Past Medical, Surgical, Social, and Family History Reviewed & Updated per EMR.   Pertinent Historical Findings include:  Past Medical History:  Diagnosis Date  . Acquired atrophy of thyroid 06/05/2013   Overview:  July 2014: controlled on synthroid 14mcg since 2012 May 2015: decreased to Synthroid 50 mcg  Last Assessment & Plan:  Pt doing well with most recent TSH within normal range.  Will continue current dosage of Synthroid 67mcg. Pt reminded to take medication on empty stomach. Will continue to monitor with periodic laboratory assessment in 3 months.   . Anemia    hemoglobin 7.4, iron deficiency, January, 2011, 2 unit transfusion, endoscopy normal, capsule endoscopy February, 2011 no small bowel abnormalities.   Most likely source gastric erosions, followed by GI  . Anxiety   . Bradycardia   . CAD (coronary artery disease)    A. CABG in 2000,status post cardiac cath in 2006, 2009 ....continued chest pain and SOB despite oral medication adjestments including Ranexa. B. Cath November 2009/ mRCA - 2.75 x 23 Abbott Xience V drug-eluting stent ...11/26/2008 to distal  RCA leading to acute marginal.  C. Cath 07/2012 for CP - stable anatomy, med rx. d. cath 2015 and 05/30/2015 stable anatomy, consider Myoview if has CP again  . Carotid artery disease ([place]) 08/01/2015   Doppler, May 29, 2015, 1-39% bilateral ICA   . Cerebral ischemia    MRI November, 2010, chronic microvascular ischemia  . CKD (chronic kidney disease), stage III ([place])   . Concussion   .  Depression    Bipolar  . Diabetes mellitus ([place])   . Edema   . Essential hypertension 06/05/2013   Overview:  July 2014: Controlled with Bystolic Sep 1194: Changed to atenolol.  Last Assessment & Plan:  Will change to atenolol for cost and follow Improved.  Medication compliance strongly encouraged BP: 116/64 mmHg    Overview:  July 2014: Controlled with Bystolic Sep 1740: Changed to atenolol.  Last Assessment & Plan:  Will change to atenolol for cost and follow  . Falling episodes    these have occurred in the past and again recurring 2011  . Family history of adverse reaction to anesthesia    "mother died during bypass surgery but not sure if it has to do with anesthesia"  . Gastric ulcer   . GERD (gastroesophageal reflux disease)   . H/O medication noncompliance    Due to loss of insurance  . H/O multiple concussions   . Hard of hearing   . History of blood transfusion 12/20/2013  . Hx of CABG    2000,  / one median sternotomy suture broken her chest x-ray November, 2010, no clinical significance  . Hyperlipidemia   . Hypertension    pt. denies  . Iron deficiency anemia   . Long term (current) use of anticoagulants [Z79.01] 07/20/2016  . Low back pain 06/12/2009   Qualifier: Diagnosis of  By: [name]   .  Nephrolithiasis   . Orthostasis   . OSA (obstructive sleep apnea)   . PAF (paroxysmal atrial fibrillation) ([place])    a. dx 2017, started on amiodarone/warfarin but patient intermittently noncompliant.  Marland Kitchen PSVT (paroxysmal supraventricular tachycardia) ([place])   . RBBB 07/09/2009   Qualifier: Diagnosis of  By: [name], MD, [name] [name]   . RLS (restless legs syndrome) 09/19/2009   Qualifier: Diagnosis of  By: [name]   Overview:  July 2014: Controlled with Mirapex  Last Assessment & Plan:  Patient is doing well. Will continue current management and follow clinically.  . Seizure disorder ([place]) 01/09/2017  . Spondylosis    C5-6, C6-7 MRI 2010  . Syncope 03/2016  .  Thyroid disease   . Tubulovillous adenoma of colon 2007  . Vitamin D deficiency 05/17/2017  . Wears glasses     Past Surgical History:  Procedure Laterality Date  . ANTERIOR CERVICAL DECOMP/DISCECTOMY FUSION N/A 05/31/2016   Procedure: ANTERIOR CERVICAL DECOMPRESSION/DISCECTOMY FUSION CERVICAL FIVE-SIX,CERVICAL SIX-SEVEN;  Surgeon: [name], MD;  Location: [facility];  Service: Neurosurgery;  Laterality: N/A;  . CARDIAC CATHETERIZATION N/A 05/30/2015   Procedure: Left Heart Cath and Coronary Angiography;  Surgeon: [name], MD;  Location: [facility];  Service: Cardiovascular;  Laterality: N/A;  . COLONOSCOPY    . CORONARY ARTERY BYPASS GRAFT     2000  . ESOPHAGOGASTRODUODENOSCOPY    . LEFT HEART CATH AND CORS/GRAFTS ANGIOGRAPHY N/A 12/15/2017   Procedure: LEFT HEART CATH AND CORS/GRAFTS ANGIOGRAPHY;  Surgeon: [name], MD;  Location: [facility];  Service: Cardiovascular;  Laterality: N/A;  . LEFT HEART CATHETERIZATION WITH CORONARY/GRAFT ANGIOGRAM N/A 08/01/2012   Procedure: LEFT HEART CATHETERIZATION WITH Beatrix Fetters;  Surgeon: [name], MD;  Location: [facility] CATH LAB;  Service: Cardiovascular;  Laterality: N/A;  . LEFT HEART CATHETERIZATION WITH CORONARY/GRAFT ANGIOGRAM N/A 01/03/2015   Procedure: LEFT HEART CATHETERIZATION WITH Beatrix Fetters;  Surgeon: [name], MD;  Location: [facility] CATH LAB;  Service: Cardiovascular;  Laterality: N/A;  . NASAL SEPTUM SURGERY     UP3  . PERCUTANEOUS CORONARY STENT INTERVENTION (PCI-S)  10/2008   mRCA PCI  2.75 x 23 Abbott Xience V drug-eluting stent   . RIGHT/LEFT HEART CATH AND CORONARY/GRAFT ANGIOGRAPHY N/A 01/07/2020   Procedure: RIGHT/LEFT HEART CATH AND CORONARY/GRAFT ANGIOGRAPHY;  Surgeon: [name], MD;  Location: [place] CV LAB;  Service: Cardiovascular;  Laterality: N/A;  . ULTRASOUND GUIDANCE FOR VASCULAR ACCESS  12/15/2017   Procedure: Ultrasound Guidance For Vascular  Access;  Surgeon: [name], MD;  Location: [place] CV LAB;  Service: Cardiovascular;;    Family History  Problem Relation Age of Onset  . Pancreatic cancer Brother   . Diabetes Brother   . Coronary artery disease Brother   . Stroke Brother   . Diabetes Brother   . Diabetes Mother   . Heart failure Mother   . Heart failure Father   . Hypothyroidism Brother   . Coronary artery disease Brother   . Other Brother        colon surgery  . Heart attack Other        Nephew  . Irregular heart beat Daughter   . Cancer Maternal Grandmother        unknown   . Colon cancer Neg Hx   . Stomach cancer Neg Hx   . Liver cancer Neg Hx   . Rectal cancer Neg Hx   .  Esophageal cancer Neg Hx     Social History   Socioeconomic History  . Marital status: Married    Spouse name: Not on file  . Number of children: 4  . Years of education: 69  . Highest education level: Not on file  Occupational History  . Occupation: retired  Tobacco Use  . Smoking status: Never Smoker  . Smokeless tobacco: Never Used  Vaping Use  . Vaping Use: Never used  Substance and Sexual Activity  . Alcohol use: No    Alcohol/week: 0.0 standard drinks    Comment: stopped drinking in 1998  . Drug use: No  . Sexual activity: Not on file  Other Topics Concern  . Not on file  Social History Narrative   Patient is right handed.   Patient does not drink caffeine.   Social Determinants of Health   Financial Resource Strain: Not on file  Food Insecurity: Not on file  Transportation Needs: Not on file  Physical Activity: Not on file  Stress: Not on file  Social Connections: Not on file  Intimate Partner Violence: Not on file     PHYSICAL EXAM:  VS: BP 110/72 (BP Location: Left Arm, Patient Position: Sitting, Cuff Size: Large)   Ht 5\' 9"  (1.753 m)   Wt 203 lb (92.1 kg)   BMI 29.98 kg/m  Physical Exam Gen: NAD, alert, cooperative with exam, well-appearing MSK:  Right knee: Mild to moderate  effusion. Normal range of motion. Tenderness palpation of the medial joint space. Neurovascular intact   Aspiration/Injection Procedure Note [name] [name] Sep 22, 1946  Procedure: Injection Indications: Right knee pain  Procedure Details Consent: Risks of procedure as well as the alternatives and risks of each were explained to the (patient/caregiver).  Consent for procedure obtained. Time Out: Verified patient identification, verified procedure, site/side was marked, verified correct patient position, special equipment/implants available, medications/allergies/relevent history reviewed, required imaging and test results available.  Performed.  The area was cleaned with iodine and alcohol swabs.    The right knee superior lateral suprapatellar pouch was injected using 1 cc's of 40 mg kenalog and 4 cc's of 0.25% bupivacaine with a 22 1 1/2" needle.  Ultrasound was used. Images were obtained in long views showing the injection.     A sterile dressing was applied.  Patient did tolerate procedure well.     ASSESSMENT & PLAN:   OA (osteoarthritis) of knee Pain is limiting with his functionality.. Recent xray was reassuring.  - injection today  - could consider PT.  - counseled on home exercise therapy and supportive care   Sprain of anterior talofibular ligament of left ankle Did have findings on x-ray that could represent an avulsion fracture.  This has been doing well for him however.  He is doing well with the brace. -Counseled on home exercise therapy and supportive care. -Continue ASO. -Could consider physical therapy.

## 2021-01-27 MED FILL — ELIQUIS 5 MG TABLET: 5 | 30 days supply | Qty: 60 | Fill #2

## 2021-01-27 MED FILL — ISOSORBIDE MN 120 MG TAB SA: 120 | 90 days supply | Qty: 90 | Fill #1

## 2021-01-27 MED FILL — tiZANidine HCL 4 MG TABS: 4 | 10 days supply | Qty: 40 | Fill #1

## 2021-01-27 MED FILL — SERTRALINE HCL 100 MG TABS: 100 | 90 days supply | Qty: 180 | Fill #1

## 2021-01-27 MED FILL — AMLODIPINE BESYLATE 5 MG TA: 5 | 90 days supply | Qty: 90 | Fill #2

## 2021-01-28 ENCOUNTER — Other Ambulatory Visit: Payer: Self-pay | Admitting: Family Medicine

## 2021-01-28 ENCOUNTER — Telehealth: Payer: Self-pay | Admitting: *Deleted

## 2021-01-28 MED FILL — RANOLAZINE ER 500 MG TB12: 500 | 30 days supply | Qty: 60 | Fill #5

## 2021-01-28 MED FILL — FUROSEMIDE 20 MG TAB: 20 | 30 days supply | Qty: 30 | Fill #0

## 2021-01-28 NOTE — Telephone Encounter (Signed)
error 

## 2021-02-10 ENCOUNTER — Other Ambulatory Visit: Payer: Self-pay

## 2021-02-10 ENCOUNTER — Ambulatory Visit (INDEPENDENT_AMBULATORY_CARE_PROVIDER_SITE_OTHER): Payer: Medicare PPO | Admitting: Family Medicine

## 2021-02-10 ENCOUNTER — Encounter: Payer: Self-pay | Admitting: Family Medicine

## 2021-02-10 DIAGNOSIS — M1711 Unilateral primary osteoarthritis, right knee: Secondary | ICD-10-CM

## 2021-02-10 NOTE — Assessment & Plan Note (Addendum)
Having significant pain with weightbearing since the recent injection.  Concerned that he may have an insufficiency fracture contributing to his symptoms after his fall. -Counseled on home exercise therapy and supportive care. -Could consider gel injection. -MRI to evaluate for insufficiency fracture versus meniscal tear

## 2021-02-10 NOTE — Patient Instructions (Signed)
Good to see you Please continue ice  Please try the rollator.  Please try to avoid putting all your weight on the right knee   Please send me a message in MyChart with any questions or updates.  We will schedule a virtual visit once the MRI is resulted.   --Dr. Raeford Razor

## 2021-02-10 NOTE — Progress Notes (Signed)
DERRIUS FURTICK - 75 y.o. male MRN [id number]  Date of birth: 09-14-1946  SUBJECTIVE:  Including CC & ROS.  No chief complaint on file.   [name] [name] is a 75 y.o. male that is following up for worsening of his right knee pain.  He had a steroid injections a few weeks ago and his pain has returned.  The pain is worse with ambulation and weightbearing.  Pain emanates from the recent fall that he had..   Review of Systems See HPI   HISTORY: Past Medical, Surgical, Social, and Family History Reviewed & Updated per EMR.   Pertinent Historical Findings include:  Past Medical History:  Diagnosis Date  . Acquired atrophy of thyroid 06/05/2013   Overview:  July 2014: controlled on synthroid 40mcg since 2012 May 2015: decreased to Synthroid 50 mcg  Last Assessment & Plan:  Pt doing well with most recent TSH within normal range.  Will continue current dosage of Synthroid 36mcg. Pt reminded to take medication on empty stomach. Will continue to monitor with periodic laboratory assessment in 3 months.   . Anemia    hemoglobin 7.4, iron deficiency, January, 2011, 2 unit transfusion, endoscopy normal, capsule endoscopy February, 2011 no small bowel abnormalities.   Most likely source gastric erosions, followed by GI  . Anxiety   . Bradycardia   . CAD (coronary artery disease)    A. CABG in 2000,status post cardiac cath in 2006, 2009 ....continued chest pain and SOB despite oral medication adjestments including Ranexa. B. Cath November 2009/ mRCA - 2.75 x 23 Abbott Xience V drug-eluting stent ...11/26/2008 to distal  RCA leading to acute marginal.  C. Cath 07/2012 for CP - stable anatomy, med rx. d. cath 2015 and 05/30/2015 stable anatomy, consider Myoview if has CP again  . Carotid artery disease ([place]) 08/01/2015   Doppler, May 29, 2015, 1-39% bilateral ICA   . Cerebral ischemia    MRI November, 2010, chronic microvascular ischemia  . CKD (chronic kidney disease), stage III ([place])   . Concussion    . Depression    Bipolar  . Diabetes mellitus ([place])   . Edema   . Essential hypertension 06/05/2013   Overview:  July 2014: Controlled with Bystolic Sep 9562: Changed to atenolol.  Last Assessment & Plan:  Will change to atenolol for cost and follow Improved.  Medication compliance strongly encouraged BP: 116/64 mmHg    Overview:  July 2014: Controlled with Bystolic Sep 1308: Changed to atenolol.  Last Assessment & Plan:  Will change to atenolol for cost and follow  . Falling episodes    these have occurred in the past and again recurring 2011  . Family history of adverse reaction to anesthesia    "mother died during bypass surgery but not sure if it has to do with anesthesia"  . Gastric ulcer   . GERD (gastroesophageal reflux disease)   . H/O medication noncompliance    Due to loss of insurance  . H/O multiple concussions   . Hard of hearing   . History of blood transfusion 12/20/2013  . Hx of CABG    2000,  / one median sternotomy suture broken her chest x-ray November, 2010, no clinical significance  . Hyperlipidemia   . Hypertension    pt. denies  . Iron deficiency anemia   . Long term (current) use of anticoagulants [Z79.01] 07/20/2016  . Low back pain 06/12/2009   Qualifier: Diagnosis of  By: [name]   .  Nephrolithiasis   . Orthostasis   . OSA (obstructive sleep apnea)   . PAF (paroxysmal atrial fibrillation) ([place])    a. dx 2017, started on amiodarone/warfarin but patient intermittently noncompliant.  Marland Kitchen PSVT (paroxysmal supraventricular tachycardia) ([place])   . RBBB 07/09/2009   Qualifier: Diagnosis of  By: [name], MD, [name] [name]   . RLS (restless legs syndrome) 09/19/2009   Qualifier: Diagnosis of  By: [name]   Overview:  July 2014: Controlled with Mirapex  Last Assessment & Plan:  Patient is doing well. Will continue current management and follow clinically.  . Seizure disorder ([place]) 01/09/2017  . Spondylosis    C5-6, C6-7 MRI 2010  . Syncope 03/2016  .  Thyroid disease   . Tubulovillous adenoma of colon 2007  . Vitamin D deficiency 05/17/2017  . Wears glasses     Past Surgical History:  Procedure Laterality Date  . ANTERIOR CERVICAL DECOMP/DISCECTOMY FUSION N/A 05/31/2016   Procedure: ANTERIOR CERVICAL DECOMPRESSION/DISCECTOMY FUSION CERVICAL FIVE-SIX,CERVICAL SIX-SEVEN;  Surgeon: [name], MD;  Location: [facility];  Service: Neurosurgery;  Laterality: N/A;  . CARDIAC CATHETERIZATION N/A 05/30/2015   Procedure: Left Heart Cath and Coronary Angiography;  Surgeon: [name], MD;  Location: [place] [facility] LAB;  Service: Cardiovascular;  Laterality: N/A;  . COLONOSCOPY    . CORONARY ARTERY BYPASS GRAFT     2000  . ESOPHAGOGASTRODUODENOSCOPY    . LEFT HEART CATH AND CORS/GRAFTS ANGIOGRAPHY N/A 12/15/2017   Procedure: LEFT HEART CATH AND CORS/GRAFTS ANGIOGRAPHY;  Surgeon: [name], MD;  Location: [place] [facility] LAB;  Service: Cardiovascular;  Laterality: N/A;  . LEFT HEART CATHETERIZATION WITH CORONARY/GRAFT ANGIOGRAM N/A 08/01/2012   Procedure: LEFT HEART CATHETERIZATION WITH Beatrix Fetters;  Surgeon: [name], MD;  Location: [facility] CATH LAB;  Service: Cardiovascular;  Laterality: N/A;  . LEFT HEART CATHETERIZATION WITH CORONARY/GRAFT ANGIOGRAM N/A 01/03/2015   Procedure: LEFT HEART CATHETERIZATION WITH Beatrix Fetters;  Surgeon: [name], MD;  Location: [facility] CATH LAB;  Service: Cardiovascular;  Laterality: N/A;  . NASAL SEPTUM SURGERY     UP3  . PERCUTANEOUS CORONARY STENT INTERVENTION (PCI-S)  10/2008   mRCA PCI  2.75 x 23 Abbott Xience V drug-eluting stent   . RIGHT/LEFT HEART CATH AND CORONARY/GRAFT ANGIOGRAPHY N/A 01/07/2020   Procedure: RIGHT/LEFT HEART CATH AND CORONARY/GRAFT ANGIOGRAPHY;  Surgeon: [name], MD;  Location: [place] [facility] LAB;  Service: Cardiovascular;  Laterality: N/A;  . ULTRASOUND GUIDANCE FOR VASCULAR ACCESS  12/15/2017   Procedure: Ultrasound Guidance For Vascular  Access;  Surgeon: [name], MD;  Location: [place] CV LAB;  Service: Cardiovascular;;    Family History  Problem Relation Age of Onset  . Pancreatic cancer Brother   . Diabetes Brother   . Coronary artery disease Brother   . Stroke Brother   . Diabetes Brother   . Diabetes Mother   . Heart failure Mother   . Heart failure Father   . Hypothyroidism Brother   . Coronary artery disease Brother   . Other Brother        colon surgery  . Heart attack Other        Nephew  . Irregular heart beat Daughter   . Cancer Maternal Grandmother        unknown   . Colon cancer Neg Hx   . Stomach cancer Neg Hx   . Liver cancer Neg Hx   . Rectal cancer Neg Hx   .  Esophageal cancer Neg Hx     Social History   Socioeconomic History  . Marital status: Married    Spouse name: Not on file  . Number of children: 4  . Years of education: 36  . Highest education level: Not on file  Occupational History  . Occupation: retired  Tobacco Use  . Smoking status: Never Smoker  . Smokeless tobacco: Never Used  Vaping Use  . Vaping Use: Never used  Substance and Sexual Activity  . Alcohol use: No    Alcohol/week: 0.0 standard drinks    Comment: stopped drinking in 1998  . Drug use: No  . Sexual activity: Not on file  Other Topics Concern  . Not on file  Social History Narrative   Patient is right handed.   Patient does not drink caffeine.   Social Determinants of Health   Financial Resource Strain: Not on file  Food Insecurity: Not on file  Transportation Needs: Not on file  Physical Activity: Not on file  Stress: Not on file  Social Connections: Not on file  Intimate Partner Violence: Not on file     PHYSICAL EXAM:  VS: BP 118/66 (BP Location: Left Arm, Patient Position: Sitting, Cuff Size: Normal)   Ht 5\' 9"  (1.753 m)   Wt 203 lb (92.1 kg)   BMI 29.98 kg/m  Physical Exam Gen: NAD, alert, cooperative with exam, well-appearing MSK:  Right knee: Tenderness  palpation of the medial joint space. Normal range of motion. Normal strength resistance. Positive Murray's test. Neurovascular intact     ASSESSMENT & PLAN:   OA (osteoarthritis) of knee Having significant pain with weightbearing since the recent injection.  Concerned that he may have an insufficiency fracture contributing to his symptoms after his fall. -Counseled on home exercise therapy and supportive care. -Could consider gel injection. -MRI to evaluate for insufficiency fracture versus meniscal tear

## 2021-02-12 ENCOUNTER — Other Ambulatory Visit: Payer: Self-pay | Admitting: Family Medicine

## 2021-02-14 ENCOUNTER — Other Ambulatory Visit: Payer: Self-pay

## 2021-02-14 ENCOUNTER — Ambulatory Visit (HOSPITAL_BASED_OUTPATIENT_CLINIC_OR_DEPARTMENT_OTHER)
Admission: RE | Admit: 2021-02-14 | Discharge: 2021-02-14 | Disposition: A | Discharge status: Home / Self Care | Payer: Medicare PPO | Source: Ambulatory Visit | Attending: Family Medicine | Admitting: Family Medicine

## 2021-02-14 DIAGNOSIS — M1711 Unilateral primary osteoarthritis, right knee: Secondary | ICD-10-CM

## 2021-02-14 DIAGNOSIS — M25561 Pain in right knee: Secondary | ICD-10-CM | POA: Diagnosis not present

## 2021-02-18 ENCOUNTER — Other Ambulatory Visit: Payer: Self-pay

## 2021-02-18 ENCOUNTER — Ambulatory Visit (INDEPENDENT_AMBULATORY_CARE_PROVIDER_SITE_OTHER): Payer: Medicare PPO | Admitting: Family Medicine

## 2021-02-18 DIAGNOSIS — M1711 Unilateral primary osteoarthritis, right knee: Secondary | ICD-10-CM

## 2021-02-18 NOTE — Assessment & Plan Note (Addendum)
MRI demonstrating severe changes but also edema in the medial tibia and medial femoral condyle.  But no fracture evident -Counseled on home exercise therapy and supportive care. -Referral to physical therapy. -Pursue gel injections.

## 2021-02-18 NOTE — Patient Instructions (Addendum)
Good to see you Please use ice as needed  Please try to limit weight bearing on the knee  Please try physical therapy  ([facility]) Please send me a message in MyChart with any questions or updates.  We will inform you when the gel injection is in.   --Dr. Raeford Razor

## 2021-02-18 NOTE — Progress Notes (Signed)
[name] [name] - 75 y.o. male MRN [id number]  Date of birth: August 19, 1946  SUBJECTIVE:  Including CC & ROS.  No chief complaint on file.   [name] [name] is a 75 y.o. male that is following up after the MRI of his right knee.  The MRI was demonstrating complex extensive tear of the medial meniscus with significant degenerative chondrosis involving the medial compartment with near full-thickness loss and edema of the medial tibia and medial femoral condyle with no stress fracture.  Also showing large effusion.   Review of Systems See HPI   HISTORY: Past Medical, Surgical, Social, and Family History Reviewed & Updated per EMR.   Pertinent Historical Findings include:  Past Medical History:  Diagnosis Date  . Acquired atrophy of thyroid 06/05/2013   Overview:  July 2014: controlled on synthroid 66mcg since 2012 May 2015: decreased to Synthroid 50 mcg  Last Assessment & Plan:  Pt doing well with most recent TSH within normal range.  Will continue current dosage of Synthroid 6mcg. Pt reminded to take medication on empty stomach. Will continue to monitor with periodic laboratory assessment in 3 months.   . Anemia    hemoglobin 7.4, iron deficiency, January, 2011, 2 unit transfusion, endoscopy normal, capsule endoscopy February, 2011 no small bowel abnormalities.   Most likely source gastric erosions, followed by GI  . Anxiety   . Bradycardia   . CAD (coronary artery disease)    A. CABG in 2000,status post cardiac cath in 2006, 2009 ....continued chest pain and SOB despite oral medication adjestments including Ranexa. B. Cath November 2009/ mRCA - 2.75 x 23 Abbott Xience V drug-eluting stent ...11/26/2008 to distal  RCA leading to acute marginal.  C. Cath 07/2012 for CP - stable anatomy, med rx. d. cath 2015 and 05/30/2015 stable anatomy, consider Myoview if has CP again  . Carotid artery disease ([place]) 08/01/2015   Doppler, May 29, 2015, 1-39% bilateral ICA   . Cerebral ischemia    MRI  November, 2010, chronic microvascular ischemia  . CKD (chronic kidney disease), stage III ([place])   . Concussion   . Depression    Bipolar  . Diabetes mellitus ([place])   . Edema   . Essential hypertension 06/05/2013   Overview:  July 2014: Controlled with Bystolic Sep 5093: Changed to atenolol.  Last Assessment & Plan:  Will change to atenolol for cost and follow Improved.  Medication compliance strongly encouraged BP: 116/64 mmHg    Overview:  July 2014: Controlled with Bystolic Sep 2671: Changed to atenolol.  Last Assessment & Plan:  Will change to atenolol for cost and follow  . Falling episodes    these have occurred in the past and again recurring 2011  . Family history of adverse reaction to anesthesia    "mother died during bypass surgery but not sure if it has to do with anesthesia"  . Gastric ulcer   . GERD (gastroesophageal reflux disease)   . H/O medication noncompliance    Due to loss of insurance  . H/O multiple concussions   . Hard of hearing   . History of blood transfusion 12/20/2013  . Hx of CABG    2000,  / one median sternotomy suture broken her chest x-ray November, 2010, no clinical significance  . Hyperlipidemia   . Hypertension    pt. denies  . Iron deficiency anemia   . Long term (current) use of anticoagulants [Z79.01] 07/20/2016  . Low back pain 06/12/2009   Qualifier: Diagnosis of  By: [name]   . Nephrolithiasis   . Orthostasis   . OSA (obstructive sleep apnea)   . PAF (paroxysmal atrial fibrillation) ([place])    a. dx 2017, started on amiodarone/warfarin but patient intermittently noncompliant.  Marland Kitchen PSVT (paroxysmal supraventricular tachycardia) ([place])   . RBBB 07/09/2009   Qualifier: Diagnosis of  By: [name], MD, [name] [name]   . RLS (restless legs syndrome) 09/19/2009   Qualifier: Diagnosis of  By: [name]   Overview:  July 2014: Controlled with Mirapex  Last Assessment & Plan:  Patient is doing well. Will continue current management and  follow clinically.  . Seizure disorder ([place]) 01/09/2017  . Spondylosis    C5-6, C6-7 MRI 2010  . Syncope 03/2016  . Thyroid disease   . Tubulovillous adenoma of colon 2007  . Vitamin D deficiency 05/17/2017  . Wears glasses     Past Surgical History:  Procedure Laterality Date  . ANTERIOR CERVICAL DECOMP/DISCECTOMY FUSION N/A 05/31/2016   Procedure: ANTERIOR CERVICAL DECOMPRESSION/DISCECTOMY FUSION CERVICAL FIVE-SIX,CERVICAL SIX-SEVEN;  Surgeon: [name], MD;  Location: [facility];  Service: Neurosurgery;  Laterality: N/A;  . CARDIAC CATHETERIZATION N/A 05/30/2015   Procedure: Left Heart Cath and Coronary Angiography;  Surgeon: [name], MD;  Location: [facility];  Service: Cardiovascular;  Laterality: N/A;  . COLONOSCOPY    . CORONARY ARTERY BYPASS GRAFT     2000  . ESOPHAGOGASTRODUODENOSCOPY    . LEFT HEART CATH AND CORS/GRAFTS ANGIOGRAPHY N/A 12/15/2017   Procedure: LEFT HEART CATH AND CORS/GRAFTS ANGIOGRAPHY;  Surgeon: [name], MD;  Location: [place] [facility];  Service: Cardiovascular;  Laterality: N/A;  . LEFT HEART CATHETERIZATION WITH CORONARY/GRAFT ANGIOGRAM N/A 08/01/2012   Procedure: LEFT HEART CATHETERIZATION WITH Beatrix Fetters;  Surgeon: [name], MD;  [facility];  Service: Cardiovascular;  Laterality: N/A;  . LEFT HEART CATHETERIZATION WITH CORONARY/GRAFT ANGIOGRAM N/A 01/03/2015   Procedure: LEFT HEART CATHETERIZATION WITH Beatrix Fetters;  Surgeon: [name], MD;  Location: [facility] CATH LAB;  Service: Cardiovascular;  Laterality: N/A;  . NASAL SEPTUM SURGERY     UP3  . PERCUTANEOUS CORONARY STENT INTERVENTION (PCI-S)  10/2008   mRCA PCI  2.75 x 23 Abbott Xience V drug-eluting stent   . RIGHT/LEFT HEART CATH AND CORONARY/GRAFT ANGIOGRAPHY N/A 01/07/2020   Procedure: RIGHT/LEFT HEART CATH AND CORONARY/GRAFT ANGIOGRAPHY;  Surgeon: [name], MD;  Location: [place] [facility];  Service: Cardiovascular;   Laterality: N/A;  . ULTRASOUND GUIDANCE FOR VASCULAR ACCESS  12/15/2017   Procedure: Ultrasound Guidance For Vascular Access;  Surgeon: [name], MD;  Location: [place] [facility];  Service: Cardiovascular;;    Family History  Problem Relation Age of Onset  . Pancreatic cancer Brother   . Diabetes Brother   . Coronary artery disease Brother   . Stroke Brother   . Diabetes Brother   . Diabetes Mother   . Heart failure Mother   . Heart failure Father   . Hypothyroidism Brother   . Coronary artery disease Brother   . Other Brother        colon surgery  . Heart attack Other        Nephew  . Irregular heart beat Daughter   . Cancer Maternal Grandmother        unknown   . Colon cancer Neg Hx   . Stomach cancer Neg Hx   . Liver cancer Neg Hx   .  Rectal cancer Neg Hx   . Esophageal cancer Neg Hx     Social History   Socioeconomic History  . Marital status: Married    Spouse name: Not on file  . Number of children: 4  . Years of education: 27  . Highest education level: Not on file  Occupational History  . Occupation: retired  Tobacco Use  . Smoking status: Never Smoker  . Smokeless tobacco: Never Used  Vaping Use  . Vaping Use: Never used  Substance and Sexual Activity  . Alcohol use: No    Alcohol/week: 0.0 standard drinks    Comment: stopped drinking in 1998  . Drug use: No  . Sexual activity: Not on file  Other Topics Concern  . Not on file  Social History Narrative   Patient is right handed.   Patient does not drink caffeine.   Social Determinants of Health   Financial Resource Strain: Not on file  Food Insecurity: Not on file  Transportation Needs: Not on file  Physical Activity: Not on file  Stress: Not on file  Social Connections: Not on file  Intimate Partner Violence: Not on file     PHYSICAL EXAM:  VS: BP 120/64   Ht 5\' 9"  (1.753 m)   Wt 200 lb (90.7 kg)   BMI 29.53 kg/m  Physical Exam Gen: NAD, alert, cooperative with exam,  well-appearing    ASSESSMENT & PLAN:   OA (osteoarthritis) of knee MRI demonstrating severe changes but also edema in the medial tibia and medial femoral condyle.  But no fracture evident -Counseled on home exercise therapy and supportive care. -Referral to physical therapy. -Pursue gel injections.

## 2021-02-19 ENCOUNTER — Telehealth (INDEPENDENT_AMBULATORY_CARE_PROVIDER_SITE_OTHER): Payer: Medicare PPO | Admitting: Family Medicine

## 2021-02-19 ENCOUNTER — Ambulatory Visit: Payer: Medicare PPO | Admitting: Family Medicine

## 2021-02-19 DIAGNOSIS — E1165 Type 2 diabetes mellitus with hyperglycemia: Secondary | ICD-10-CM

## 2021-02-19 DIAGNOSIS — E782 Mixed hyperlipidemia: Secondary | ICD-10-CM

## 2021-02-19 DIAGNOSIS — D649 Anemia, unspecified: Secondary | ICD-10-CM | POA: Diagnosis not present

## 2021-02-19 DIAGNOSIS — I10 Essential (primary) hypertension: Secondary | ICD-10-CM

## 2021-02-19 DIAGNOSIS — N184 Chronic kidney disease, stage 4 (severe): Secondary | ICD-10-CM | POA: Diagnosis not present

## 2021-02-19 DIAGNOSIS — E785 Hyperlipidemia, unspecified: Secondary | ICD-10-CM

## 2021-02-19 DIAGNOSIS — E559 Vitamin D deficiency, unspecified: Secondary | ICD-10-CM

## 2021-02-19 NOTE — Progress Notes (Addendum)
MyChart Video Visit    Virtual Visit via Video Note   This visit type was conducted due to national recommendations for restrictions regarding the COVID-19 Pandemic (e.g. social distancing) in an effort to limit this patient's exposure and mitigate transmission in our community. This patient is at least at moderate risk for complications without adequate follow up. This format is felt to be most appropriate for this patient at this time. Physical exam was limited by quality of the video and audio technology used for the visit. CMA was able to get the patient set up on a video visit.  Patient location: Home Provider location: Office, LBSW Patient and provider in visit  I discussed the limitations of evaluation and management by telemedicine and the availability of in person appointments. The patient expressed understanding and agreed to proceed.  Visit Date: 02/19/2021   Today's healthcare provider: Penni Homans, MD     Subjective:    Patient ID: [name] [name], male    DOB: 1945-12-12, 75 y.o.   MRN: [id number]  No chief complaint on file.   HPI Patient is in today for follow up on chronic medical concerns. No recent febrile illness or hospitalizations. No polyuria or polydipsia. He is trying to eat better a d maintain a heart healthy diet. Denies CP/palp/SOB/HA/congestion/fevers/GI or GU c/o. Taking meds as prescribed.   Past Medical History:  Diagnosis Date  . Acquired atrophy of thyroid 06/05/2013   Overview:  July 2014: controlled on synthroid 69mcg since 2012 May 2015: decreased to Synthroid 50 mcg  Last Assessment & Plan:  Pt doing well with most recent TSH within normal range.  Will continue current dosage of Synthroid 97mcg. Pt reminded to take medication on empty stomach. Will continue to monitor with periodic laboratory assessment in 3 months.   . Anemia    hemoglobin 7.4, iron deficiency, January, 2011, 2 unit transfusion, endoscopy normal, capsule endoscopy  February, 2011 no small bowel abnormalities.   Most likely source gastric erosions, followed by GI  . Anxiety   . Bradycardia   . CAD (coronary artery disease)    A. CABG in 2000,status post cardiac cath in 2006, 2009 ....continued chest pain and SOB despite oral medication adjestments including Ranexa. B. Cath November 2009/ mRCA - 2.75 x 23 Abbott Xience V drug-eluting stent ...11/26/2008 to distal  RCA leading to acute marginal.  C. Cath 07/2012 for CP - stable anatomy, med rx. d. cath 2015 and 05/30/2015 stable anatomy, consider Myoview if has CP again  . Carotid artery disease ([place]) 08/01/2015   Doppler, May 29, 2015, 1-39% bilateral ICA   . Cerebral ischemia    MRI November, 2010, chronic microvascular ischemia  . CKD (chronic kidney disease), stage III ([place])   . Concussion   . Depression    Bipolar  . Diabetes mellitus ([place])   . Edema   . Essential hypertension 06/05/2013   Overview:  July 2014: Controlled with Bystolic Sep 2876: Changed to atenolol.  Last Assessment & Plan:  Will change to atenolol for cost and follow Improved.  Medication compliance strongly encouraged BP: 116/64 mmHg    Overview:  July 2014: Controlled with Bystolic Sep 8115: Changed to atenolol.  Last Assessment & Plan:  Will change to atenolol for cost and follow  . Falling episodes    these have occurred in the past and again recurring 2011  . Family history of adverse reaction to anesthesia    "mother died during bypass surgery but not sure  if it has to do with anesthesia"  . Gastric ulcer   . GERD (gastroesophageal reflux disease)   . H/O medication noncompliance    Due to loss of insurance  . H/O multiple concussions   . Hard of hearing   . History of blood transfusion 12/20/2013  . Hx of CABG    2000,  / one median sternotomy suture broken her chest x-ray November, 2010, no clinical significance  . Hyperlipidemia   . Hypertension    pt. denies  . Iron deficiency anemia   . Long term (current) use of  anticoagulants [Z79.01] 07/20/2016  . Low back pain 06/12/2009   Qualifier: Diagnosis of  By: [name]   . Nephrolithiasis   . Orthostasis   . OSA (obstructive sleep apnea)   . PAF (paroxysmal atrial fibrillation) ([place])    a. dx 2017, started on amiodarone/warfarin but patient intermittently noncompliant.  Marland Kitchen PSVT (paroxysmal supraventricular tachycardia) ([place])   . RBBB 07/09/2009   Qualifier: Diagnosis of  By: [name], MD, [name] [name]   . RLS (restless legs syndrome) 09/19/2009   Qualifier: Diagnosis of  By: [name]   Overview:  July 2014: Controlled with Mirapex  Last Assessment & Plan:  Patient is doing well. Will continue current management and follow clinically.  . Seizure disorder ([place]) 01/09/2017  . Spondylosis    C5-6, C6-7 MRI 2010  . Syncope 03/2016  . Thyroid disease   . Tubulovillous adenoma of colon 2007  . Vitamin D deficiency 05/17/2017  . Wears glasses     Past Surgical History:  Procedure Laterality Date  . ANTERIOR CERVICAL DECOMP/DISCECTOMY FUSION N/A 05/31/2016   Procedure: ANTERIOR CERVICAL DECOMPRESSION/DISCECTOMY FUSION CERVICAL FIVE-SIX,CERVICAL SIX-SEVEN;  Surgeon: [name], MD;  Location: [facility];  Service: Neurosurgery;  Laterality: N/A;  . CARDIAC CATHETERIZATION N/A 05/30/2015   Procedure: Left Heart Cath and Coronary Angiography;  Surgeon: [name], MD;  Location: [place];  Service: Cardiovascular;  Laterality: N/A;  . COLONOSCOPY    . CORONARY ARTERY BYPASS GRAFT     2000  . ESOPHAGOGASTRODUODENOSCOPY    . LEFT HEART CATH AND CORS/GRAFTS ANGIOGRAPHY N/A 12/15/2017   Procedure: LEFT HEART CATH AND CORS/GRAFTS ANGIOGRAPHY;  Surgeon: [name], MD;  Location: [place] [facility];  Service: Cardiovascular;  Laterality: N/A;  . LEFT HEART CATHETERIZATION WITH CORONARY/GRAFT ANGIOGRAM N/A 08/01/2012   Procedure: LEFT HEART CATHETERIZATION WITH Beatrix Fetters;  Surgeon: [name], MD;  Location:  [facility];  Service: Cardiovascular;  Laterality: N/A;  . LEFT HEART CATHETERIZATION WITH CORONARY/GRAFT ANGIOGRAM N/A 01/03/2015   Procedure: LEFT HEART CATHETERIZATION WITH Beatrix Fetters;  Surgeon: [name], MD;  Location: [facility] CATH LAB;  Service: Cardiovascular;  Laterality: N/A;  . NASAL SEPTUM SURGERY     UP3  . PERCUTANEOUS CORONARY STENT INTERVENTION (PCI-S)  10/2008   mRCA PCI  2.75 x 23 Abbott Xience V drug-eluting stent   . RIGHT/LEFT HEART CATH AND CORONARY/GRAFT ANGIOGRAPHY N/A 01/07/2020   Procedure: RIGHT/LEFT HEART CATH AND CORONARY/GRAFT ANGIOGRAPHY;  Surgeon: [name], MD;  Location: [place] [facility];  Service: Cardiovascular;  Laterality: N/A;  . ULTRASOUND GUIDANCE FOR VASCULAR ACCESS  12/15/2017   Procedure: Ultrasound Guidance For Vascular Access;  Surgeon: [name], MD;  Location: [place] [facility];  Service: Cardiovascular;;    Family History  Problem Relation Age of Onset  . Pancreatic cancer Brother   . Diabetes Brother   .  Coronary artery disease Brother   . Stroke Brother   . Diabetes Brother   . Diabetes Mother   . Heart failure Mother   . Heart failure Father   . Hypothyroidism Brother   . Coronary artery disease Brother   . Other Brother        colon surgery  . Heart attack Other        Nephew  . Irregular heart beat Daughter   . Cancer Maternal Grandmother        unknown   . Colon cancer Neg Hx   . Stomach cancer Neg Hx   . Liver cancer Neg Hx   . Rectal cancer Neg Hx   . Esophageal cancer Neg Hx     Social History   Socioeconomic History  . Marital status: Married    Spouse name: Not on file  . Number of children: 4  . Years of education: 20  . Highest education level: Not on file  Occupational History  . Occupation: retired  Tobacco Use  . Smoking status: Never Smoker  . Smokeless tobacco: Never Used  Vaping Use  . Vaping Use: Never used  Substance and Sexual Activity  . Alcohol use: No     Alcohol/week: 0.0 standard drinks    Comment: stopped drinking in 1998  . Drug use: No  . Sexual activity: Not on file  Other Topics Concern  . Not on file  Social History Narrative   Patient is right handed.   Patient does not drink caffeine.   Social Determinants of Health   Financial Resource Strain: Not on file  Food Insecurity: Not on file  Transportation Needs: Not on file  Physical Activity: Not on file  Stress: Not on file  Social Connections: Not on file  Intimate Partner Violence: Not on file    Outpatient Medications Prior to Visit  Medication Sig Dispense Refill  . albuterol (VENTOLIN HFA) 108 (90 Base) MCG/ACT inhaler Inhale 2 puffs into the lungs every 6 (six) hours as needed for wheezing or shortness of breath. 18 g 2  . amLODipine (NORVASC) 5 MG tablet Take 1 tablet (5 mg total) by mouth at bedtime. 90 tablet 2  . atorvastatin (LIPITOR) 80 MG tablet Take 1 tablet (80 mg total) by mouth at bedtime. 90 tablet 3  . Continuous Blood Gluc Receiver (FREESTYLE LIBRE 14 DAY READER) DEVI For continuous Blood Glucose monitoring 1 each 0  . Continuous Blood Gluc Sensor (FREESTYLE LIBRE 14 DAY SENSOR) MISC 1 Device by Does not apply route every 14 (fourteen) days. 6 each 3  . Diclofenac Sodium (PENNSAID) 2 % SOLN Place 1 application onto the skin 2 (two) times daily. 112 g 2  . divalproex (DEPAKOTE ER) 250 MG 24 hr tablet Take 3 tablets (750 mg total) by mouth at bedtime. 180 tablet 0  . ELIQUIS 5 MG TABS tablet TAKE 1 TABLET BY MOUTH TWICE DAILY 60 tablet 5  . fenofibrate (TRICOR) 145 MG tablet Take 1 tablet (145 mg total) by mouth daily. 90 tablet 1  . furosemide (LASIX) 20 MG tablet TAKE 1 TABLET BY MOUTH DAILY AS NEEDED FOR EDEMA 30 tablet 3  . glucose blood (ACCU-CHEK AVIVA) test strip Use as directed to check blood sugar 4 times daily.  DX E11.9 200 each 6  . insulin aspart (NOVOLOG FLEXPEN) 100 UNIT/ML FlexPen 3 times a day (just before each meal) 60-70-100 units 60 mL 11   . insulin NPH Human (NOVOLIN N) 100 UNIT/ML injection  Inject 0.4 mLs (40 Units total) into the skin at bedtime. 40 mL 11  . isosorbide mononitrate (IMDUR) 120 MG 24 hr tablet Take 1 tablet (120 mg total) by mouth daily. 90 tablet 3  . levothyroxine (SYNTHROID) 100 MCG tablet TAKE 1 TABLET (100 MCG TOTAL) BY MOUTH DAILY. 90 tablet 1  . metoprolol succinate (TOPROL-XL) 50 MG 24 hr tablet TAKE 1 AND 1/2 TABLETS EVERY DAY FOR A TOTAL OF 75 MG 135 tablet 3  . nitroGLYCERIN (NITROSTAT) 0.4 MG SL tablet PLACE 1 TABLET UNDER THE TONGUE EVERY 5 MINUTES AS NEEDED FOR CHEST PAIN. 25 tablet 1  . pantoprazole (PROTONIX) 40 MG tablet Take 1 tablet (40 mg total) by mouth daily. 90 tablet 3  . pramipexole (MIRAPEX) 1 MG tablet TAKE 1/2 TO 1 TABLET AT BEDTIME 90 tablet 0  . ranolazine (RANEXA) 500 MG 12 hr tablet TAKE 1 TABLET BY MOUTH TWICE DAILY 60 tablet 5  . Semaglutide, 1 MG/DOSE, (OZEMPIC, 1 MG/DOSE,) 2 MG/1.5ML SOPN Inject 1 mg into the skin once a week. 9 mL 3  . sertraline (ZOLOFT) 100 MG tablet TAKE 1 TABLET BY MOUTH TWICE DAILY 180 tablet 1  . tiZANidine (ZANAFLEX) 4 MG tablet Take 1 tablet (4 mg total) by mouth every 6 (six) hours as needed for muscle spasms. 40 tablet 2  . ULTICARE INSULIN SYRINGE 31G X 5/16" 1 ML MISC USE TO INJECT INSULIN DAILY 100 each 0  . ULTICARE SHORT PEN NEEDLES 31G X 8 MM MISC USE TO CHECK BLOOD SUGAR THREE TIME DAILY 100 each 5  . Vitamin D, Ergocalciferol, (DRISDOL) 1.25 MG (50000 UNIT) CAPS capsule TAKE 1 CAPSULE BY MOUTH EVERY 7 DAYS 4 capsule 4   No facility-administered medications prior to visit.    Allergies  Allergen Reactions  . Morphine Other (See Comments)    hallucinations  . Shellfish-Derived Products Itching    Review of Systems  Constitutional: Negative for fever and malaise/fatigue.  HENT: Negative for congestion.   Eyes: Negative for blurred vision.  Respiratory: Negative for cough and shortness of breath.   Cardiovascular: Negative for chest  pain, palpitations and leg swelling.  Gastrointestinal: Negative for abdominal pain, blood in stool and nausea.  Genitourinary: Negative for dysuria and frequency.  Musculoskeletal: Negative for back pain and myalgias.  Skin: Negative for rash.  Neurological: Negative for dizziness and headaches.       Objective:    Physical Exam Constitutional:      General: He is not in acute distress.    Appearance: Normal appearance. He is not ill-appearing.  HENT:     Head: Normocephalic and atraumatic.     Right Ear: External ear normal.     Left Ear: External ear normal.  Eyes:     Pupils: Pupils are equal, round, and reactive to light.  Pulmonary:     Effort: Pulmonary effort is normal.  Musculoskeletal:     Cervical back: Normal range of motion.  Skin:    General: Skin is dry.  Neurological:     Mental Status: He is alert and oriented to person, place, and time.  Psychiatric:        Behavior: Behavior normal.     There were no vitals taken for this visit. Wt Readings from Last 3 Encounters:  02/18/21 200 lb (90.7 kg)  02/10/21 203 lb (92.1 kg)  01/22/21 203 lb (92.1 kg)    Diabetic Foot Exam - Simple   No data filed    Lab Results  Component Value Date   WBC 7.3 08/14/2020   HGB 13.2 08/14/2020   HCT 38.7 08/14/2020   PLT 231 08/14/2020   GLUCOSE 246 (H) 10/23/2020   CHOL 118 08/14/2020   TRIG 160 (H) 08/14/2020   HDL 32 (L) 08/14/2020   LDLDIRECT 77.0 08/20/2019   LDLCALC 62 08/14/2020   ALT 15 09/15/2020   AST 17 09/15/2020   NA 134 10/23/2020   K 4.1 10/23/2020   CL 97 10/23/2020   CREATININE 1.97 (H) 10/23/2020   BUN 22 10/23/2020   CO2 22 10/23/2020   TSH 2.11 08/14/2020   PSA 3.14 08/20/2019   INR 2.7 04/23/2019   HGBA1C 7.9 (A) 12/24/2020   MICROALBUR <0.7 05/14/2019    Lab Results  Component Value Date   TSH 2.11 08/14/2020   Lab Results  Component Value Date   WBC 7.3 08/14/2020   HGB 13.2 08/14/2020   HCT 38.7 08/14/2020   MCV 89.8  08/14/2020   PLT 231 08/14/2020   Lab Results  Component Value Date   NA 134 10/23/2020   K 4.1 10/23/2020   CO2 22 10/23/2020   GLUCOSE 246 (H) 10/23/2020   BUN 22 10/23/2020   CREATININE 1.97 (H) 10/23/2020   BILITOT 0.8 09/15/2020   ALKPHOS 71 05/14/2020   AST 17 09/15/2020   ALT 15 09/15/2020   PROT 7.0 09/15/2020   ALBUMIN 4.5 05/14/2020   CALCIUM 9.6 10/23/2020   ANIONGAP 8 01/08/2020   GFR 39.06 (L) 05/14/2020   Lab Results  Component Value Date   CHOL 118 08/14/2020   Lab Results  Component Value Date   HDL 32 (L) 08/14/2020   Lab Results  Component Value Date   LDLCALC 62 08/14/2020   Lab Results  Component Value Date   TRIG 160 (H) 08/14/2020   Lab Results  Component Value Date   CHOLHDL 3.7 08/14/2020   Lab Results  Component Value Date   HGBA1C 7.9 (A) 12/24/2020       Assessment & Plan:   Problem List Items Addressed This Visit   None     No orders of the defined types were placed in this encounter.   I discussed the assessment and treatment plan with the patient. The patient was provided an opportunity to ask questions and all were answered. The patient agreed with the plan and demonstrated an understanding of the instructions.   The patient was advised to call back or seek an in-person evaluation if the symptoms worsen or if the condition fails to improve as anticipated.  I provided 20 minutes of non-face-to-face time during this encounter.   Penni Homans, MD [facility] at [facility] [phone] (phone) [phone] (fax)  [place]

## 2021-02-22 NOTE — Assessment & Plan Note (Signed)
Supplement and monitor 

## 2021-02-22 NOTE — Assessment & Plan Note (Signed)
Monitor and report any concerns, no changes to meds. Encouraged heart healthy diet such as the DASH diet and exercise as tolerated.  ?

## 2021-02-22 NOTE — Assessment & Plan Note (Signed)
Hydrate and monitor 

## 2021-02-22 NOTE — Assessment & Plan Note (Signed)
hgba1c acceptable, minimize simple carbs. Increase exercise as tolerated. Continue current meds 

## 2021-02-22 NOTE — Assessment & Plan Note (Signed)
Encouraged heart healthy diet, increase exercise, avoid trans fats, consider a krill oil cap daily. Tolerating Atorvastatin 

## 2021-02-23 ENCOUNTER — Other Ambulatory Visit (INDEPENDENT_AMBULATORY_CARE_PROVIDER_SITE_OTHER): Payer: Medicare PPO

## 2021-02-23 ENCOUNTER — Other Ambulatory Visit: Payer: Self-pay

## 2021-02-23 DIAGNOSIS — E559 Vitamin D deficiency, unspecified: Secondary | ICD-10-CM | POA: Diagnosis not present

## 2021-02-23 DIAGNOSIS — I10 Essential (primary) hypertension: Secondary | ICD-10-CM | POA: Diagnosis not present

## 2021-02-23 DIAGNOSIS — E785 Hyperlipidemia, unspecified: Secondary | ICD-10-CM

## 2021-02-23 DIAGNOSIS — E1165 Type 2 diabetes mellitus with hyperglycemia: Secondary | ICD-10-CM | POA: Diagnosis not present

## 2021-02-23 MED FILL — TRULICITY 1.5 MG/0.5 ML PEN: 1.5 | 28 days supply | Qty: 2 | Fill #2

## 2021-02-24 ENCOUNTER — Other Ambulatory Visit: Payer: Self-pay | Admitting: *Deleted

## 2021-02-24 ENCOUNTER — Other Ambulatory Visit: Payer: Self-pay | Admitting: Endocrinology

## 2021-02-24 DIAGNOSIS — E1165 Type 2 diabetes mellitus with hyperglycemia: Secondary | ICD-10-CM

## 2021-02-24 DIAGNOSIS — N289 Disorder of kidney and ureter, unspecified: Secondary | ICD-10-CM

## 2021-02-24 LAB — CBC
HCT: 38.9 % — ABNORMAL LOW (ref 39.0–52.0)
Hemoglobin: 13.6 g/dL (ref 13.0–17.0)
MCHC: 35 g/dL (ref 30.0–36.0)
MCV: 89 fl (ref 78.0–100.0)
Platelets: 218 10*3/uL (ref 150.0–400.0)
RBC: 4.37 Mil/uL (ref 4.22–5.81)
RDW: 14.3 % (ref 11.5–15.5)
WBC: 8 10*3/uL (ref 4.0–10.5)

## 2021-02-24 LAB — LIPID PANEL
Cholesterol: 115 mg/dL (ref 0–200)
HDL: 38.4 mg/dL — ABNORMAL LOW (ref >39.00)
LDL Cholesterol: 51 mg/dL (ref 0–99)
NonHDL: 76.67
Total CHOL/HDL Ratio: 3
Triglycerides: 129 mg/dL (ref 0.0–149.0)
VLDL: 25.8 mg/dL (ref 0.0–40.0)

## 2021-02-24 LAB — COMPREHENSIVE METABOLIC PANEL
ALT: 14 U/L (ref 0–53)
AST: 18 U/L (ref 0–37)
Albumin: 4.7 g/dL (ref 3.5–5.2)
Alkaline Phosphatase: 59 U/L (ref 39–117)
BUN: 26 mg/dL — ABNORMAL HIGH (ref 6–23)
CO2: 27 mEq/L (ref 19–32)
Calcium: 9.7 mg/dL (ref 8.4–10.5)
Chloride: 102 mEq/L (ref 96–112)
Creatinine, Ser: 2.14 mg/dL — ABNORMAL HIGH (ref 0.40–1.50)
GFR: 30.19 mL/min — ABNORMAL LOW (ref >60.00)
Glucose, Bld: 127 mg/dL — ABNORMAL HIGH (ref 70–99)
Potassium: 4 mEq/L (ref 3.5–5.1)
Sodium: 138 mEq/L (ref 135–145)
Total Bilirubin: 0.7 mg/dL (ref 0.2–1.2)
Total Protein: 7.1 g/dL (ref 6.0–8.3)

## 2021-02-24 LAB — VITAMIN D 25 HYDROXY (VIT D DEFICIENCY, FRACTURES): VITD: 46.43 ng/mL (ref 30.00–100.00)

## 2021-02-24 LAB — TSH: TSH: 3.87 u[IU]/mL (ref 0.35–4.50)

## 2021-02-24 LAB — HEMOGLOBIN A1C: Hgb A1c MFr Bld: 9 % — ABNORMAL HIGH (ref 4.6–6.5)

## 2021-02-24 MED FILL — ULTCARE INS SYR 1 ML 31GX5/: 31G X 5/16" | 90 days supply | Qty: 100 | Fill #0

## 2021-02-25 ENCOUNTER — Other Ambulatory Visit: Payer: Self-pay

## 2021-02-25 ENCOUNTER — Other Ambulatory Visit: Payer: Self-pay | Admitting: Endocrinology

## 2021-02-25 ENCOUNTER — Encounter: Payer: Self-pay | Admitting: Family Medicine

## 2021-02-25 ENCOUNTER — Ambulatory Visit (INDEPENDENT_AMBULATORY_CARE_PROVIDER_SITE_OTHER): Payer: Medicare PPO | Admitting: Family Medicine

## 2021-02-25 ENCOUNTER — Ambulatory Visit (INDEPENDENT_AMBULATORY_CARE_PROVIDER_SITE_OTHER): Payer: Medicare PPO | Admitting: Endocrinology

## 2021-02-25 VITALS — BP 120/60 | HR 65 | Ht 69.0 in | Wt 205.6 lb

## 2021-02-25 DIAGNOSIS — M1711 Unilateral primary osteoarthritis, right knee: Secondary | ICD-10-CM

## 2021-02-25 DIAGNOSIS — E1165 Type 2 diabetes mellitus with hyperglycemia: Secondary | ICD-10-CM | POA: Diagnosis not present

## 2021-02-25 LAB — POCT GLYCOSYLATED HEMOGLOBIN (HGB A1C): Hemoglobin A1C: 8.3 % — AB (ref 4.0–5.6)

## 2021-02-25 MED ORDER — INSULIN NPH (HUMAN) (ISOPHANE) 100 UNIT/ML ~~LOC~~ SUSP: 20.0000 [IU] | Freq: Every day | SUBCUTANEOUS | 11 refills | Status: DC

## 2021-02-25 MED ORDER — HYDROCODONE-ACETAMINOPHEN 5-325 MG PO TABS: 1.0000 | ORAL_TABLET | Freq: Three times a day (TID) | ORAL | 0 refills | Status: DC | PRN

## 2021-02-25 MED ORDER — FREESTYLE LIBRE 14 DAY SENSOR MISC: 1.0000 | 3 refills | Status: DC

## 2021-02-25 MED ORDER — NOVOLOG FLEXPEN 100 UNIT/ML ~~LOC~~ SOPN: PEN_INJECTOR | SUBCUTANEOUS | 3 refills | Status: DC

## 2021-02-25 MED FILL — NovoLIN N 100 UNIT/ML SUSP: 100 | 50 days supply | Qty: 10 | Fill #0

## 2021-02-25 MED FILL — HYDROCODON-APAP 5-325: 5-325 | 5 days supply | Qty: 15 | Fill #0

## 2021-02-25 NOTE — Assessment & Plan Note (Addendum)
Acute worsening of his pain. Imaging did demonstrate changes in the bone showing edema as well as degenerative changes. -Counseled on home exercise therapy and supportive care. -Referral to orthopedic surgery -Continue gel injections.  - norco

## 2021-02-25 NOTE — Progress Notes (Signed)
[name] [name] - 75 y.o. male MRN [id number]  Date of birth: 09-06-1946  SUBJECTIVE:  Including CC & ROS.  No chief complaint on file.   [name] [name] is a 75 y.o. male that is presenting with worsening of his right knee pain.  He has tried steroid and injections as well as medications.  His pain is worse and severe when ambulating.   Review of Systems See HPI   HISTORY: Past Medical, Surgical, Social, and Family History Reviewed & Updated per EMR.   Pertinent Historical Findings include:  Past Medical History:  Diagnosis Date  . Acquired atrophy of thyroid 06/05/2013   Overview:  July 2014: controlled on synthroid 25mcg since 2012 May 2015: decreased to Synthroid 50 mcg  Last Assessment & Plan:  Pt doing well with most recent TSH within normal range.  Will continue current dosage of Synthroid 75mcg. Pt reminded to take medication on empty stomach. Will continue to monitor with periodic laboratory assessment in 3 months.   . Anemia    hemoglobin 7.4, iron deficiency, January, 2011, 2 unit transfusion, endoscopy normal, capsule endoscopy February, 2011 no small bowel abnormalities.   Most likely source gastric erosions, followed by GI  . Anxiety   . Bradycardia   . CAD (coronary artery disease)    A. CABG in 2000,status post cardiac cath in 2006, 2009 ....continued chest pain and SOB despite oral medication adjestments including Ranexa. B. Cath November 2009/ mRCA - 2.75 x 23 Abbott Xience V drug-eluting stent ...11/26/2008 to distal  RCA leading to acute marginal.  C. Cath 07/2012 for CP - stable anatomy, med rx. d. cath 2015 and 05/30/2015 stable anatomy, consider Myoview if has CP again  . Carotid artery disease ([place]) 08/01/2015   Doppler, May 29, 2015, 1-39% bilateral ICA   . Cerebral ischemia    MRI November, 2010, chronic microvascular ischemia  . CKD (chronic kidney disease), stage III ([place])   . Concussion   . Depression    Bipolar  . Diabetes mellitus ([place])   . Edema   .  Essential hypertension 06/05/2013   Overview:  July 2014: Controlled with Bystolic Sep 7494: Changed to atenolol.  Last Assessment & Plan:  Will change to atenolol for cost and follow Improved.  Medication compliance strongly encouraged BP: 116/64 mmHg    Overview:  July 2014: Controlled with Bystolic Sep 4967: Changed to atenolol.  Last Assessment & Plan:  Will change to atenolol for cost and follow  . Falling episodes    these have occurred in the past and again recurring 2011  . Family history of adverse reaction to anesthesia    "mother died during bypass surgery but not sure if it has to do with anesthesia"  . Gastric ulcer   . GERD (gastroesophageal reflux disease)   . H/O medication noncompliance    Due to loss of insurance  . H/O multiple concussions   . Hard of hearing   . History of blood transfusion 12/20/2013  . Hx of CABG    2000,  / one median sternotomy suture broken her chest x-ray November, 2010, no clinical significance  . Hyperlipidemia   . Hypertension    pt. denies  . Iron deficiency anemia   . Long term (current) use of anticoagulants [Z79.01] 07/20/2016  . Low back pain 06/12/2009   Qualifier: Diagnosis of  By: [name]   . Nephrolithiasis   . Orthostasis   . OSA (obstructive sleep apnea)   .  PAF (paroxysmal atrial fibrillation) ([place])    a. dx 2017, started on amiodarone/warfarin but patient intermittently noncompliant.  Marland Kitchen PSVT (paroxysmal supraventricular tachycardia) ([place])   . RBBB 07/09/2009   Qualifier: Diagnosis of  By: [name], MD, [name] [name]   . RLS (restless legs syndrome) 09/19/2009   Qualifier: Diagnosis of  By: [name]   Overview:  July 2014: Controlled with Mirapex  Last Assessment & Plan:  Patient is doing well. Will continue current management and follow clinically.  . Seizure disorder ([place]) 01/09/2017  . Spondylosis    C5-6, C6-7 MRI 2010  . Syncope 03/2016  . Thyroid disease   . Tubulovillous adenoma of colon 2007  . Vitamin D  deficiency 05/17/2017  . Wears glasses     Past Surgical History:  Procedure Laterality Date  . ANTERIOR CERVICAL DECOMP/DISCECTOMY FUSION N/A 05/31/2016   Procedure: ANTERIOR CERVICAL DECOMPRESSION/DISCECTOMY FUSION CERVICAL FIVE-SIX,CERVICAL SIX-SEVEN;  Surgeon: [name], MD;  Location: [facility];  Service: Neurosurgery;  Laterality: N/A;  . CARDIAC CATHETERIZATION N/A 05/30/2015   Procedure: Left Heart Cath and Coronary Angiography;  Surgeon: [name], MD;  Location: [facility];  Service: Cardiovascular;  Laterality: N/A;  . COLONOSCOPY    . CORONARY ARTERY BYPASS GRAFT     2000  . ESOPHAGOGASTRODUODENOSCOPY    . LEFT HEART CATH AND CORS/GRAFTS ANGIOGRAPHY N/A 12/15/2017   Procedure: LEFT HEART CATH AND CORS/GRAFTS ANGIOGRAPHY;  Surgeon: [name], MD;  Location: [facility];  Service: Cardiovascular;  Laterality: N/A;  . LEFT HEART CATHETERIZATION WITH CORONARY/GRAFT ANGIOGRAM N/A 08/01/2012   Procedure: LEFT HEART CATHETERIZATION WITH Beatrix Fetters;  Surgeon: [name], MD;  Location: [facility] CATH LAB;  Service: Cardiovascular;  Laterality: N/A;  . LEFT HEART CATHETERIZATION WITH CORONARY/GRAFT ANGIOGRAM N/A 01/03/2015   Procedure: LEFT HEART CATHETERIZATION WITH Beatrix Fetters;  Surgeon: [name], MD;  Location: [facility] CATH LAB;  Service: Cardiovascular;  Laterality: N/A;  . NASAL SEPTUM SURGERY     UP3  . PERCUTANEOUS CORONARY STENT INTERVENTION (PCI-S)  10/2008   mRCA PCI  2.75 x 23 Abbott Xience V drug-eluting stent   . RIGHT/LEFT HEART CATH AND CORONARY/GRAFT ANGIOGRAPHY N/A 01/07/2020   Procedure: RIGHT/LEFT HEART CATH AND CORONARY/GRAFT ANGIOGRAPHY;  Surgeon: [name], MD;  Location: [facility];  Service: Cardiovascular;  Laterality: N/A;  . ULTRASOUND GUIDANCE FOR VASCULAR ACCESS  12/15/2017   Procedure: Ultrasound Guidance For Vascular Access;  Surgeon: [name], MD;  Location: [facility]  LAB;  Service: Cardiovascular;;    Family History  Problem Relation Age of Onset  . Pancreatic cancer Brother   . Diabetes Brother   . Coronary artery disease Brother   . Stroke Brother   . Diabetes Brother   . Diabetes Mother   . Heart failure Mother   . Heart failure Father   . Hypothyroidism Brother   . Coronary artery disease Brother   . Other Brother        colon surgery  . Heart attack Other        Nephew  . Irregular heart beat Daughter   . Cancer Maternal Grandmother        unknown   . Colon cancer Neg Hx   . Stomach cancer Neg Hx   . Liver cancer Neg Hx   . Rectal cancer Neg Hx   . Esophageal cancer Neg Hx     Social History   Socioeconomic History  .  Marital status: Married    Spouse name: Not on file  . Number of children: 4  . Years of education: 83  . Highest education level: Not on file  Occupational History  . Occupation: retired  Tobacco Use  . Smoking status: Never Smoker  . Smokeless tobacco: Never Used  Vaping Use  . Vaping Use: Never used  Substance and Sexual Activity  . Alcohol use: No    Alcohol/week: 0.0 standard drinks    Comment: stopped drinking in 1998  . Drug use: No  . Sexual activity: Not on file  Other Topics Concern  . Not on file  Social History Narrative   Patient is right handed.   Patient does not drink caffeine.   Social Determinants of Health   Financial Resource Strain: Not on file  Food Insecurity: Not on file  Transportation Needs: Not on file  Physical Activity: Not on file  Stress: Not on file  Social Connections: Not on file  Intimate Partner Violence: Not on file     PHYSICAL EXAM:  VS: BP (!) 100/58 (BP Location: Left Arm, Patient Position: Sitting, Cuff Size: Large)   Ht 5\' 9"  (1.753 m)   Wt 200 lb (90.7 kg)   BMI 29.53 kg/m  Physical Exam Gen: NAD, alert, cooperative with exam, well-appearing    ASSESSMENT & PLAN:   OA (osteoarthritis) of knee Acute worsening of his pain. Imaging did  demonstrate changes in the bone showing edema as well as degenerative changes. -Counseled on home exercise therapy and supportive care. -Referral to orthopedic surgery -Continue gel injections.  - norco

## 2021-02-25 NOTE — Progress Notes (Signed)
Subjective:    Patient ID: [name] [name], male    DOB: 05/19/1946, 75 y.o.   MRN: [id number]  HPI Pt returns for f/u of diabetes mellitus:  DM type: Insulin-requiring type 2.  Dx'ed: 1761 Complications: PN, stage 3b CRI, CAD and PAD.   Therapy: insulin since 6073, and Trulicity.   DKA: never Severe hypoglycemia: never Pancreatitis: never Pancreatic imaging: 2017 CT showed fatty replacement of the pancreatic head and body.   SDOH: HS insulin is NPH, due to cost; he could not afford Trulicity Other: he takes multiple daily injections; he is not using continuous glucose monitor.    Interval history: no cbg record, but states cbg varies from 60-400.  It is in general higher as the day goes on. pt states he feels well in general. Past Medical History:  Diagnosis Date  . Acquired atrophy of thyroid 06/05/2013   Overview:  July 2014: controlled on synthroid 64mcg since 2012 May 2015: decreased to Synthroid 50 mcg  Last Assessment & Plan:  Pt doing well with most recent TSH within normal range.  Will continue current dosage of Synthroid 95mcg. Pt reminded to take medication on empty stomach. Will continue to monitor with periodic laboratory assessment in 3 months.   . Anemia    hemoglobin 7.4, iron deficiency, January, 2011, 2 unit transfusion, endoscopy normal, capsule endoscopy February, 2011 no small bowel abnormalities.   Most likely source gastric erosions, followed by GI  . Anxiety   . Bradycardia   . CAD (coronary artery disease)    A. CABG in 2000,status post cardiac cath in 2006, 2009 ....continued chest pain and SOB despite oral medication adjestments including Ranexa. B. Cath November 2009/ mRCA - 2.75 x 23 Abbott Xience V drug-eluting stent ...11/26/2008 to distal  RCA leading to acute marginal.  C. Cath 07/2012 for CP - stable anatomy, med rx. d. cath 2015 and 05/30/2015 stable anatomy, consider Myoview if has CP again  . Carotid artery disease ([place]) 08/01/2015   Doppler, May 29, 2015, 1-39% bilateral ICA   . Cerebral ischemia    MRI November, 2010, chronic microvascular ischemia  . CKD (chronic kidney disease), stage III ([place])   . Concussion   . Depression    Bipolar  . Diabetes mellitus ([place])   . Edema   . Essential hypertension 06/05/2013   Overview:  July 2014: Controlled with Bystolic Sep 7106: Changed to atenolol.  Last Assessment & Plan:  Will change to atenolol for cost and follow Improved.  Medication compliance strongly encouraged BP: 116/64 mmHg    Overview:  July 2014: Controlled with Bystolic Sep 2694: Changed to atenolol.  Last Assessment & Plan:  Will change to atenolol for cost and follow  . Falling episodes    these have occurred in the past and again recurring 2011  . Family history of adverse reaction to anesthesia    "mother died during bypass surgery but not sure if it has to do with anesthesia"  . Gastric ulcer   . GERD (gastroesophageal reflux disease)   . H/O medication noncompliance    Due to loss of insurance  . H/O multiple concussions   . Hard of hearing   . History of blood transfusion 12/20/2013  . Hx of CABG    2000,  / one median sternotomy suture broken her chest x-ray November, 2010, no clinical significance  . Hyperlipidemia   . Hypertension    pt. denies  . Iron deficiency anemia   . Long  term (current) use of anticoagulants [Z79.01] 07/20/2016  . Low back pain 06/12/2009   Qualifier: Diagnosis of  By: [name]   . Nephrolithiasis   . Orthostasis   . OSA (obstructive sleep apnea)   . PAF (paroxysmal atrial fibrillation) ([place])    a. dx 2017, started on amiodarone/warfarin but patient intermittently noncompliant.  Marland Kitchen PSVT (paroxysmal supraventricular tachycardia) ([place])   . RBBB 07/09/2009   Qualifier: Diagnosis of  By: [name], MD, [name] [name]   . RLS (restless legs syndrome) 09/19/2009   Qualifier: Diagnosis of  By: [name]   Overview:  July 2014: Controlled with Mirapex  Last Assessment & Plan:   Patient is doing well. Will continue current management and follow clinically.  . Seizure disorder ([place]) 01/09/2017  . Spondylosis    C5-6, C6-7 MRI 2010  . Syncope 03/2016  . Thyroid disease   . Tubulovillous adenoma of colon 2007  . Vitamin D deficiency 05/17/2017  . Wears glasses     Past Surgical History:  Procedure Laterality Date  . ANTERIOR CERVICAL DECOMP/DISCECTOMY FUSION N/A 05/31/2016   Procedure: ANTERIOR CERVICAL DECOMPRESSION/DISCECTOMY FUSION CERVICAL FIVE-SIX,CERVICAL SIX-SEVEN;  Surgeon: [name], MD;  Location: [facility];  Service: Neurosurgery;  Laterality: N/A;  . CARDIAC CATHETERIZATION N/A 05/30/2015   Procedure: Left Heart Cath and Coronary Angiography;  Surgeon: [name], MD;  Location: [facility];  Service: Cardiovascular;  Laterality: N/A;  . COLONOSCOPY    . CORONARY ARTERY BYPASS GRAFT     2000  . ESOPHAGOGASTRODUODENOSCOPY    . LEFT HEART CATH AND CORS/GRAFTS ANGIOGRAPHY N/A 12/15/2017   Procedure: LEFT HEART CATH AND CORS/GRAFTS ANGIOGRAPHY;  Surgeon: [name], MD;  Location: [facility];  Service: Cardiovascular;  Laterality: N/A;  . LEFT HEART CATHETERIZATION WITH CORONARY/GRAFT ANGIOGRAM N/A 08/01/2012   Procedure: LEFT HEART CATHETERIZATION WITH Beatrix Fetters;  Surgeon: [name], MD;  Location: [facility] CATH LAB;  Service: Cardiovascular;  Laterality: N/A;  . LEFT HEART CATHETERIZATION WITH CORONARY/GRAFT ANGIOGRAM N/A 01/03/2015   Procedure: LEFT HEART CATHETERIZATION WITH Beatrix Fetters;  Surgeon: [name], MD;  Location: [facility] CATH LAB;  Service: Cardiovascular;  Laterality: N/A;  . NASAL SEPTUM SURGERY     UP3  . PERCUTANEOUS CORONARY STENT INTERVENTION (PCI-S)  10/2008   mRCA PCI  2.75 x 23 Abbott Xience V drug-eluting stent   . RIGHT/LEFT HEART CATH AND CORONARY/GRAFT ANGIOGRAPHY N/A 01/07/2020   Procedure: RIGHT/LEFT HEART CATH AND CORONARY/GRAFT ANGIOGRAPHY;  Surgeon: [name], MD;   Location: [facility];  Service: Cardiovascular;  Laterality: N/A;  . ULTRASOUND GUIDANCE FOR VASCULAR ACCESS  12/15/2017   Procedure: Ultrasound Guidance For Vascular Access;  Surgeon: [name], MD;  Location: [facility];  Service: Cardiovascular;;    Social History   Socioeconomic History  . Marital status: Married    Spouse name: Not on file  . Number of children: 4  . Years of education: 31  . Highest education level: Not on file  Occupational History  . Occupation: retired  Tobacco Use  . Smoking status: Never Smoker  . Smokeless tobacco: Never Used  Vaping Use  . Vaping Use: Never used  Substance and Sexual Activity  . Alcohol use: No    Alcohol/week: 0.0 standard drinks    Comment: stopped drinking in 1998  . Drug use: No  . Sexual activity: Not on file  Other Topics Concern  . Not  on file  Social History Narrative   Patient is right handed.   Patient does not drink caffeine.   Social Determinants of Health   Financial Resource Strain: Not on file  Food Insecurity: Not on file  Transportation Needs: Not on file  Physical Activity: Not on file  Stress: Not on file  Social Connections: Not on file  Intimate Partner Violence: Not on file    Current Outpatient Medications on File Prior to Visit  Medication Sig Dispense Refill  . albuterol (VENTOLIN HFA) 108 (90 Base) MCG/ACT inhaler Inhale 2 puffs into the lungs every 6 (six) hours as needed for wheezing or shortness of breath. 18 g 2  . amLODipine (NORVASC) 5 MG tablet Take 1 tablet (5 mg total) by mouth at bedtime. 90 tablet 2  . atorvastatin (LIPITOR) 80 MG tablet Take 1 tablet (80 mg total) by mouth at bedtime. 90 tablet 3  . Continuous Blood Gluc Receiver (FREESTYLE LIBRE 14 DAY READER) DEVI For continuous Blood Glucose monitoring 1 each 0  . Diclofenac Sodium (PENNSAID) 2 % SOLN Place 1 application onto the skin 2 (two) times daily. 112 g 2  . divalproex (DEPAKOTE ER) 250 MG 24 hr  tablet Take 3 tablets (750 mg total) by mouth at bedtime. 180 tablet 0  . ELIQUIS 5 MG TABS tablet TAKE 1 TABLET BY MOUTH TWICE DAILY 60 tablet 5  . fenofibrate (TRICOR) 145 MG tablet Take 1 tablet (145 mg total) by mouth daily. 90 tablet 1  . furosemide (LASIX) 20 MG tablet TAKE 1 TABLET BY MOUTH DAILY AS NEEDED FOR EDEMA 30 tablet 3  . glucose blood (ACCU-CHEK AVIVA) test strip Use as directed to check blood sugar 4 times daily.  DX E11.9 200 each 6  . isosorbide mononitrate (IMDUR) 120 MG 24 hr tablet Take 1 tablet (120 mg total) by mouth daily. 90 tablet 3  . levothyroxine (SYNTHROID) 100 MCG tablet TAKE 1 TABLET (100 MCG TOTAL) BY MOUTH DAILY. 90 tablet 1  . metoprolol succinate (TOPROL-XL) 50 MG 24 hr tablet TAKE 1 AND 1/2 TABLETS EVERY DAY FOR A TOTAL OF 75 MG 135 tablet 3  . nitroGLYCERIN (NITROSTAT) 0.4 MG SL tablet PLACE 1 TABLET UNDER THE TONGUE EVERY 5 MINUTES AS NEEDED FOR CHEST PAIN. 25 tablet 1  . pantoprazole (PROTONIX) 40 MG tablet Take 1 tablet (40 mg total) by mouth daily. 90 tablet 3  . pramipexole (MIRAPEX) 1 MG tablet TAKE 1/2 TO 1 TABLET AT BEDTIME 90 tablet 0  . ranolazine (RANEXA) 500 MG 12 hr tablet TAKE 1 TABLET BY MOUTH TWICE DAILY 60 tablet 5  . sertraline (ZOLOFT) 100 MG tablet TAKE 1 TABLET BY MOUTH TWICE DAILY 180 tablet 1  . tiZANidine (ZANAFLEX) 4 MG tablet Take 1 tablet (4 mg total) by mouth every 6 (six) hours as needed for muscle spasms. 40 tablet 2  . ULTICARE INSULIN SYRINGE 31G X 5/16" 1 ML MISC USE AS DIRECTED WITH NOVOLIN 100 each 0  . ULTICARE SHORT PEN NEEDLES 31G X 8 MM MISC USE TO CHECK BLOOD SUGAR THREE TIME DAILY 100 each 5  . Vitamin D, Ergocalciferol, (DRISDOL) 1.25 MG (50000 UNIT) CAPS capsule TAKE 1 CAPSULE BY MOUTH EVERY 7 DAYS 4 capsule 4   No current facility-administered medications on file prior to visit.    Allergies  Allergen Reactions  . Morphine Other (See Comments)    hallucinations  . Shellfish-Derived Products Itching     Family History  Problem Relation Age of  Onset  . Pancreatic cancer Brother   . Diabetes Brother   . Coronary artery disease Brother   . Stroke Brother   . Diabetes Brother   . Diabetes Mother   . Heart failure Mother   . Heart failure Father   . Hypothyroidism Brother   . Coronary artery disease Brother   . Other Brother        colon surgery  . Heart attack Other        Nephew  . Irregular heart beat Daughter   . Cancer Maternal Grandmother        unknown   . Colon cancer Neg Hx   . Stomach cancer Neg Hx   . Liver cancer Neg Hx   . Rectal cancer Neg Hx   . Esophageal cancer Neg Hx     BP 120/60 (BP Location: Right Arm, Patient Position: Sitting, Cuff Size: Normal)   Pulse 65   Ht 5\' 9"  (1.753 m)   Wt 205 lb 9.6 oz (93.3 kg)   SpO2 98%   BMI 30.36 kg/m    Review of Systems Denies LOC    Objective:   Physical Exam VITAL SIGNS:  See vs page GENERAL: no distress Pulses: dorsalis pedis intact bilat.   MSK: no deformity of the feet CV: trace bilat leg edema Skin:  no ulcer on the feet.  normal color and temp on the feet. Neuro: sensation is intact to touch on the feet    Lab Results  Component Value Date   HGBA1C 8.3 (A) 02/25/2021       Assessment & Plan:  Insulin-requiring type 2 DM, with PAD: uncontrolled Hypoglycemia, due to insulin: The pattern of his cbg's indicates he needs some adjustment in his therapy  Patient Instructions  check your blood sugar twice a day.  vary the time of day when you check, between before the 3 meals, and at bedtime.  also check if you have symptoms of your blood sugar being too high or too low.  please keep a record of the readings and bring it to your next appointment here (or you can bring the meter itself).  You can write it on any piece of paper.  please call us sooner if your blood sugar goes below 70, or if you have a lot of readings over 200.   I have sent a prescription to your pharmacy, to resume the continuous  glucose monitor.   Please also change the insulins to the numbers listed below.  Please come back for a follow-up appointment in 2 months.

## 2021-02-25 NOTE — Patient Instructions (Addendum)
check your blood sugar twice a day.  vary the time of day when you check, between before the 3 meals, and at bedtime.  also check if you have symptoms of your blood sugar being too high or too low.  please keep a record of the readings and bring it to your next appointment here (or you can bring the meter itself).  You can write it on any piece of paper.  please call us sooner if your blood sugar goes below 70, or if you have a lot of readings over 200.   I have sent a prescription to your pharmacy, to resume the continuous glucose monitor.   Please also change the insulins to the numbers listed below.  Please come back for a follow-up appointment in 2 months.

## 2021-02-25 NOTE — Progress Notes (Signed)
Approval for monovisc [phone]) per [facility] with the [facility] number of [id number]

## 2021-02-25 NOTE — Patient Instructions (Addendum)
Good to see you Please use the pain medicine as needed  We have sent you to the orthopedist    [place] [phone] Wednesday 03/04/21 at [place]  Please send me a message in [place] with any questions or updates.  We will inform you when the gel injection is in.   --Dr. Raeford Razor

## 2021-02-26 ENCOUNTER — Encounter: Payer: Self-pay | Admitting: Family Medicine

## 2021-02-26 ENCOUNTER — Ambulatory Visit (INDEPENDENT_AMBULATORY_CARE_PROVIDER_SITE_OTHER): Payer: Medicare PPO | Admitting: Family Medicine

## 2021-02-26 ENCOUNTER — Ambulatory Visit: Payer: Self-pay

## 2021-02-26 VITALS — BP 132/70 | Ht 69.0 in | Wt 200.0 lb

## 2021-02-26 DIAGNOSIS — M1711 Unilateral primary osteoarthritis, right knee: Secondary | ICD-10-CM | POA: Diagnosis not present

## 2021-02-26 NOTE — Assessment & Plan Note (Signed)
Monovisc injection today.

## 2021-02-26 NOTE — Progress Notes (Signed)
[name] [name] - 75 y.o. male MRN [id number]  Date of birth: 1946/08/13  SUBJECTIVE:  Including CC & ROS.  No chief complaint on file.   [name] [name] is a 75 y.o. male that is presenting for gel injection.  Review of Systems See HPI   HISTORY: Past Medical, Surgical, Social, and Family History Reviewed & Updated per EMR.   Pertinent Historical Findings include:  Past Medical History:  Diagnosis Date  . Acquired atrophy of thyroid 06/05/2013   Overview:  July 2014: controlled on synthroid 26mcg since 2012 May 2015: decreased to Synthroid 50 mcg  Last Assessment & Plan:  Pt doing well with most recent TSH within normal range.  Will continue current dosage of Synthroid 22mcg. Pt reminded to take medication on empty stomach. Will continue to monitor with periodic laboratory assessment in 3 months.   . Anemia    hemoglobin 7.4, iron deficiency, January, 2011, 2 unit transfusion, endoscopy normal, capsule endoscopy February, 2011 no small bowel abnormalities.   Most likely source gastric erosions, followed by GI  . Anxiety   . Bradycardia   . CAD (coronary artery disease)    A. CABG in 2000,status post cardiac cath in 2006, 2009 ....continued chest pain and SOB despite oral medication adjestments including Ranexa. B. Cath November 2009/ mRCA - 2.75 x 23 Abbott Xience V drug-eluting stent ...11/26/2008 to distal  RCA leading to acute marginal.  C. Cath 07/2012 for CP - stable anatomy, med rx. d. cath 2015 and 05/30/2015 stable anatomy, consider Myoview if has CP again  . Carotid artery disease ([place]) 08/01/2015   Doppler, May 29, 2015, 1-39% bilateral ICA   . Cerebral ischemia    MRI November, 2010, chronic microvascular ischemia  . CKD (chronic kidney disease), stage III ([place])   . Concussion   . Depression    Bipolar  . Diabetes mellitus ([place])   . Edema   . Essential hypertension 06/05/2013   Overview:  July 2014: Controlled with Bystolic Sep 8841: Changed to atenolol.  Last Assessment  & Plan:  Will change to atenolol for cost and follow Improved.  Medication compliance strongly encouraged BP: 116/64 mmHg    Overview:  July 2014: Controlled with Bystolic Sep 6606: Changed to atenolol.  Last Assessment & Plan:  Will change to atenolol for cost and follow  . Falling episodes    these have occurred in the past and again recurring 2011  . Family history of adverse reaction to anesthesia    "mother died during bypass surgery but not sure if it has to do with anesthesia"  . Gastric ulcer   . GERD (gastroesophageal reflux disease)   . H/O medication noncompliance    Due to loss of insurance  . H/O multiple concussions   . Hard of hearing   . History of blood transfusion 12/20/2013  . Hx of CABG    2000,  / one median sternotomy suture broken her chest x-ray November, 2010, no clinical significance  . Hyperlipidemia   . Hypertension    pt. denies  . Iron deficiency anemia   . Long term (current) use of anticoagulants [Z79.01] 07/20/2016  . Low back pain 06/12/2009   Qualifier: Diagnosis of  By: [name]   . Nephrolithiasis   . Orthostasis   . OSA (obstructive sleep apnea)   . PAF (paroxysmal atrial fibrillation) ([place])    a. dx 2017, started on amiodarone/warfarin but patient intermittently noncompliant.  Marland Kitchen PSVT (paroxysmal supraventricular tachycardia) ([place])   .  RBBB 07/09/2009   Qualifier: Diagnosis of  By: [name], MD, [name] [name]   . RLS (restless legs syndrome) 09/19/2009   Qualifier: Diagnosis of  By: [name]   Overview:  July 2014: Controlled with Mirapex  Last Assessment & Plan:  Patient is doing well. Will continue current management and follow clinically.  . Seizure disorder ([place]) 01/09/2017  . Spondylosis    C5-6, C6-7 MRI 2010  . Syncope 03/2016  . Thyroid disease   . Tubulovillous adenoma of colon 2007  . Vitamin D deficiency 05/17/2017  . Wears glasses     Past Surgical History:  Procedure Laterality Date  . ANTERIOR CERVICAL  DECOMP/DISCECTOMY FUSION N/A 05/31/2016   Procedure: ANTERIOR CERVICAL DECOMPRESSION/DISCECTOMY FUSION CERVICAL FIVE-SIX,CERVICAL SIX-SEVEN;  Surgeon: [name], MD;  Location: [facility];  Service: Neurosurgery;  Laterality: N/A;  . CARDIAC CATHETERIZATION N/A 05/30/2015   Procedure: Left Heart Cath and Coronary Angiography;  Surgeon: [name], MD;  Location: [facility];  Service: Cardiovascular;  Laterality: N/A;  . COLONOSCOPY    . CORONARY ARTERY BYPASS GRAFT     2000  . ESOPHAGOGASTRODUODENOSCOPY    . LEFT HEART CATH AND CORS/GRAFTS ANGIOGRAPHY N/A 12/15/2017   Procedure: LEFT HEART CATH AND CORS/GRAFTS ANGIOGRAPHY;  Surgeon: [name], MD;  Location: [facility];  Service: Cardiovascular;  Laterality: N/A;  . LEFT HEART CATHETERIZATION WITH CORONARY/GRAFT ANGIOGRAM N/A 08/01/2012   Procedure: LEFT HEART CATHETERIZATION WITH Beatrix Fetters;  Surgeon: [name], MD;  Location: [facility] CATH LAB;  Service: Cardiovascular;  Laterality: N/A;  . LEFT HEART CATHETERIZATION WITH CORONARY/GRAFT ANGIOGRAM N/A 01/03/2015   Procedure: LEFT HEART CATHETERIZATION WITH Beatrix Fetters;  Surgeon: [name], MD;  Location: [facility] CATH LAB;  Service: Cardiovascular;  Laterality: N/A;  . NASAL SEPTUM SURGERY     UP3  . PERCUTANEOUS CORONARY STENT INTERVENTION (PCI-S)  10/2008   mRCA PCI  2.75 x 23 Abbott Xience V drug-eluting stent   . RIGHT/LEFT HEART CATH AND CORONARY/GRAFT ANGIOGRAPHY N/A 01/07/2020   Procedure: RIGHT/LEFT HEART CATH AND CORONARY/GRAFT ANGIOGRAPHY;  Surgeon: [name], MD;  Location: [facility];  Service: Cardiovascular;  Laterality: N/A;  . ULTRASOUND GUIDANCE FOR VASCULAR ACCESS  12/15/2017   Procedure: Ultrasound Guidance For Vascular Access;  Surgeon: [name], MD;  Location: [facility];  Service: Cardiovascular;;    Family History  Problem Relation Age of Onset  . Pancreatic cancer Brother   .  Diabetes Brother   . Coronary artery disease Brother   . Stroke Brother   . Diabetes Brother   . Diabetes Mother   . Heart failure Mother   . Heart failure Father   . Hypothyroidism Brother   . Coronary artery disease Brother   . Other Brother        colon surgery  . Heart attack Other        Nephew  . Irregular heart beat Daughter   . Cancer Maternal Grandmother        unknown   . Colon cancer Neg Hx   . Stomach cancer Neg Hx   . Liver cancer Neg Hx   . Rectal cancer Neg Hx   . Esophageal cancer Neg Hx     Social History   Socioeconomic History  . Marital status: Married    Spouse name: Not on file  . Number of children: 4  . Years of education: 17  . Highest  education level: Not on file  Occupational History  . Occupation: retired  Tobacco Use  . Smoking status: Never Smoker  . Smokeless tobacco: Never Used  Vaping Use  . Vaping Use: Never used  Substance and Sexual Activity  . Alcohol use: No    Alcohol/week: 0.0 standard drinks    Comment: stopped drinking in 1998  . Drug use: No  . Sexual activity: Not on file  Other Topics Concern  . Not on file  Social History Narrative   Patient is right handed.   Patient does not drink caffeine.   Social Determinants of Health   Financial Resource Strain: Not on file  Food Insecurity: Not on file  Transportation Needs: Not on file  Physical Activity: Not on file  Stress: Not on file  Social Connections: Not on file  Intimate Partner Violence: Not on file     PHYSICAL EXAM:  VS: BP 132/70 (BP Location: Left Arm, Patient Position: Sitting, Cuff Size: Normal)   Ht 5\' 9"  (1.753 m)   Wt 200 lb (90.7 kg)   BMI 29.53 kg/m  Physical Exam Gen: NAD, alert, cooperative with exam, well-appearing    Aspiration/Injection Procedure Note ONESIMO LINGARD [birth year]  Procedure: Injection Indications: right knee pain   Procedure Details Consent: Risks of procedure as well as the alternatives and risks of each  were explained to the (patient/caregiver).  Consent for procedure obtained. Time Out: Verified patient identification, verified procedure, site/side was marked, verified correct patient position, special equipment/implants available, medications/allergies/relevent history reviewed, required imaging and test results available.  Performed.  The area was cleaned with iodine and alcohol swabs.    The right knee superior lateral suprapatellar pouch was injected using 4 cc's of 1% lidocaine with a 21 2" needle.  The syringe was switched and 4 mL 22 mg/mL of monovsic was injected. Ultrasound was used. Images were obtained in  Long views showing the injection.     A sterile dressing was applied.  Patient did tolerate procedure well.     ASSESSMENT & PLAN:   OA (osteoarthritis) of knee Monovisc injection today.

## 2021-02-26 NOTE — Patient Instructions (Signed)
Good to see you Please try ice as needed   Please send me a message in MyChart with any questions or updates.  Please see me [place] back as scheduled.   --Dr. Raeford Razor

## 2021-03-02 ENCOUNTER — Other Ambulatory Visit: Payer: Self-pay | Admitting: Family Medicine

## 2021-03-02 MED FILL — FUROSEMIDE 20 MG TAB: 20 | 30 days supply | Qty: 30 | Fill #1

## 2021-03-02 MED FILL — ELIQUIS 5 MG TABLET: 5 | 30 days supply | Qty: 60 | Fill #3

## 2021-03-02 MED FILL — FENOFIBRATE 145 MG TABS: 145 | 90 days supply | Qty: 90 | Fill #1

## 2021-03-03 ENCOUNTER — Other Ambulatory Visit: Payer: Self-pay | Admitting: Family Medicine

## 2021-03-03 ENCOUNTER — Telehealth: Payer: Medicare PPO

## 2021-03-03 MED ORDER — CETIRIZINE HCL 10 MG PO TABS
10.0000 mg | ORAL_TABLET | Freq: Every day | ORAL | 2 refills | Status: DC
Start: 2021-03-03 — End: 2021-03-03

## 2021-03-03 MED ORDER — FAMOTIDINE 20 MG PO TABS: 20.0000 mg | ORAL_TABLET | Freq: Two times a day (BID) | ORAL | 2 refills | Status: DC

## 2021-03-03 MED FILL — FAMOTIDINE 20 MG TABS: 20 | 30 days supply | Qty: 60 | Fill #0

## 2021-03-03 MED FILL — CETIRIZINE HCL 10 MG TABS: 10 | 100 days supply | Qty: 100 | Fill #0

## 2021-03-04 ENCOUNTER — Telehealth: Payer: Self-pay

## 2021-03-04 ENCOUNTER — Other Ambulatory Visit: Payer: Self-pay | Admitting: Orthopaedic Surgery

## 2021-03-04 DIAGNOSIS — M1711 Unilateral primary osteoarthritis, right knee: Secondary | ICD-10-CM | POA: Diagnosis not present

## 2021-03-04 DIAGNOSIS — Z01818 Encounter for other preprocedural examination: Secondary | ICD-10-CM

## 2021-03-04 MED ORDER — HUMALOG KWIKPEN 200 UNIT/ML ~~LOC~~ SOPN: PEN_INJECTOR | SUBCUTANEOUS | 3 refills | Status: DC

## 2021-03-04 NOTE — Telephone Encounter (Signed)
I am trying to do a PA for this pt and some questions came up that I am not sure of since the PA stated that the amt that you prescribedExceeds Adult Maximum Dosage Guideline . So one of the questions stated:  Please provide rationale for the requested quantity:  1. Dosage titration up to the FDA approved max daily dose 2.equested quantity is supported by pharmacy compendia or peer-reviewed published medical literature for patient's diagnosis  3. Patient has already tried and failed the covered quantity (dosage deemed ineffective in the treatment of the patient's disease or medical condition)  Other  Can the patient use a commercially available higher strength instead of the requested strength and take fewer caps/tabs per dose (does not apply if using for dose titration)?  A.Yes  B  No or Not Applicable  If the answer to this questions is No, please explain to me why so I can explain to them.  Please Advise

## 2021-03-04 NOTE — Telephone Encounter (Signed)
Ok, I have sent a prescription to your pharmacy, to change to humalog

## 2021-03-04 NOTE — Addendum Note (Signed)
Addended by: Renato Shin on: 03/04/2021 04:43 PM   Modules accepted: Orders

## 2021-03-04 NOTE — Telephone Encounter (Signed)
I need to know the name of the med. TY

## 2021-03-04 NOTE — Telephone Encounter (Signed)
Medication is Novolog Flexpen 100u/ml

## 2021-03-05 ENCOUNTER — Telehealth: Payer: Self-pay | Admitting: Interventional Cardiology

## 2021-03-05 NOTE — Telephone Encounter (Signed)
Per pre op provider today pt needs appt for chest pain with activity as well as pre op clearance appt. Pt has been scheduled to see [name] 03/19/21 @ 2:40. I will forward notes to MD for upcoming appt.

## 2021-03-05 NOTE — Telephone Encounter (Signed)
   [facility] Pre-operative Risk Assessment    Request for surgical clearance:  1. What type of surgery is being performed? Right knee replacement   2. When is this surgery scheduled? March 31, 2021  3. What type of clearance is required (medical clearance vs. Pharmacy clearance to hold med vs. Both)? both  4. Are there any medications that need to be held prior to surgery and how long? ELIQUIS 5 MG TABS tablet TBD by Dr Irish Lack  5. Practice name and name of physician performing surgery? Dr. Thurman Coyer  6. What is your office phone number [phone]   7.   What is your office fax number [phone]  8.   Anesthesia type (None, local, MAC, general) ? spinal   Milbert Coulter 03/05/2021, 11:10 AM  _________________________________________________________________   (provider comments below)

## 2021-03-05 NOTE — Telephone Encounter (Signed)
[place], MD  Chart reviewed as part of pre-operative protocol coverage. Because of [name] [name]'s past medical history and time since last visit, he/she will require a follow-up visit in order to better assess preoperative cardiovascular risk.  Pre-op covering staff: - Please schedule appointment and call patient to inform them. - Please contact requesting surgeon's office via preferred method (i.e, phone, fax) to inform them of need for appointment prior to surgery.  If applicable, this message will also be routed to pharmacy pool and/or primary cardiologist for input on holding anticoagulant/antiplatelet agent as requested below so that this information is available at time of patient's appointment.   Deberah Pelton, NP  03/05/2021, 5:01 PM

## 2021-03-05 NOTE — Telephone Encounter (Signed)
Patient with diagnosis of atrial fibrillation on Eliquis for anticoagulation.    Procedure: Right Knee replacement Date of procedure: 03/31/21   CHA2DS2-VASc Score = 5  This indicates a 7.2% annual risk of stroke. The patient's score is based upon: CHF History: Yes HTN History: Yes Diabetes History: Yes Stroke History: No Vascular Disease History: Yes Age Score: 1 Gender Score: 0   CrCl 40.0 Platelet count 218  Per office protocol, patient can hold Eliquis for 3 days prior to procedure.   Patient will not need bridging with Lovenox (enoxaparin) around procedure.  For orthopedic procedures please be sure to resume therapeutic (not prophylactic) dosing.

## 2021-03-06 ENCOUNTER — Other Ambulatory Visit: Payer: Self-pay

## 2021-03-07 ENCOUNTER — Other Ambulatory Visit: Payer: Self-pay | Admitting: Family Medicine

## 2021-03-07 ENCOUNTER — Other Ambulatory Visit: Payer: Self-pay | Admitting: Interventional Cardiology

## 2021-03-07 ENCOUNTER — Other Ambulatory Visit (HOSPITAL_BASED_OUTPATIENT_CLINIC_OR_DEPARTMENT_OTHER): Payer: Self-pay

## 2021-03-09 ENCOUNTER — Telehealth: Payer: Self-pay | Admitting: Family Medicine

## 2021-03-09 ENCOUNTER — Telehealth: Payer: Self-pay | Admitting: Endocrinology

## 2021-03-09 ENCOUNTER — Telehealth: Payer: Self-pay

## 2021-03-09 ENCOUNTER — Other Ambulatory Visit (HOSPITAL_BASED_OUTPATIENT_CLINIC_OR_DEPARTMENT_OTHER): Payer: Self-pay

## 2021-03-09 DIAGNOSIS — D649 Anemia, unspecified: Secondary | ICD-10-CM

## 2021-03-09 MED ORDER — ATORVASTATIN CALCIUM 80 MG PO TABS
80.0000 mg | ORAL_TABLET | Freq: Every day | ORAL | 3 refills | Status: DC
  Filled 2021-03-09: qty 90, 90d supply, fill #0
  Filled 2021-06-23: qty 90, 90d supply, fill #1

## 2021-03-09 MED FILL — Insulin Aspart Soln Pen-injector 100 Unit/ML: SUBCUTANEOUS | 15 days supply | Qty: 30 | Fill #0 | Status: CN

## 2021-03-09 MED FILL — Insulin Aspart Soln Pen-injector 100 Unit/ML: SUBCUTANEOUS | 35 days supply | Qty: 75 | Fill #0 | Status: CN

## 2021-03-09 NOTE — Telephone Encounter (Signed)
But has he heard from them or does he have an appt? If not we need to follow up with referral coordinator

## 2021-03-09 NOTE — Telephone Encounter (Signed)
Patient called to find out why his Novolog "jumped up" so high.  His medicine will cost him $600 plus for 90 days and pharmacy states insurance won't pay for 30 day supply. Patient has requested a call back.  Call back number [phone]

## 2021-03-09 NOTE — Telephone Encounter (Signed)
Spoke with pt and he has not been to the nephrology appointment yet.

## 2021-03-09 NOTE — Telephone Encounter (Signed)
Caller: [facility]) Call back [phone]  Wants to know if you received clearance form faxed on 03/04/21

## 2021-03-09 NOTE — Telephone Encounter (Signed)
I spoke with pt and he stated that his insurance company stated the for the Novolog it was a too big of a dose and will not cover it.Now they may cover a 30 day supply.  Please Advise

## 2021-03-09 NOTE — Telephone Encounter (Signed)
It was the [employer]

## 2021-03-10 ENCOUNTER — Other Ambulatory Visit (HOSPITAL_BASED_OUTPATIENT_CLINIC_OR_DEPARTMENT_OTHER): Payer: Self-pay

## 2021-03-10 MED FILL — Ranolazine Tab ER 12HR 500 MG: ORAL | 30 days supply | Qty: 60 | Fill #0 | Status: AC

## 2021-03-10 MED FILL — Insulin Aspart Soln Pen-injector 100 Unit/ML: SUBCUTANEOUS | 8 days supply | Qty: 15 | Fill #0 | Status: AC

## 2021-03-10 NOTE — Telephone Encounter (Signed)
I need to know what alternative is covered at high dosage

## 2021-03-10 NOTE — Telephone Encounter (Signed)
It is number 3.  Lower dosage was ineffective, so it had to be titrated higher.

## 2021-03-10 NOTE — Telephone Encounter (Signed)
Left message on machine that we are waiting for patient to come in for lab work on Thursday and as soon as we get back we will send form.

## 2021-03-10 NOTE — Telephone Encounter (Signed)
Patient called back stating his insurance said they were going to send a fax to [place] regarding this situation - patient was not sure exactly how to explain this. He asked if Mardene Celeste could give him a call back to discuss at [phone].

## 2021-03-10 NOTE — Telephone Encounter (Signed)
Just spoke with pt again and he stated that the insurance company only paid for one box. They still are saying that the dosing is too high and will not cover what you prescribed. He stated that the one box will probably last a little over a week.  Any suggestions from here?

## 2021-03-10 NOTE — Telephone Encounter (Signed)
Patient is already scheduled with kidney specialist per [name] [name]

## 2021-03-10 NOTE — Telephone Encounter (Signed)
Spoke with pt and he will contact [employer] and find out what the alternative is that they will cover.

## 2021-03-10 NOTE — Telephone Encounter (Signed)
OK 

## 2021-03-11 ENCOUNTER — Other Ambulatory Visit: Payer: Self-pay

## 2021-03-11 ENCOUNTER — Other Ambulatory Visit (HOSPITAL_BASED_OUTPATIENT_CLINIC_OR_DEPARTMENT_OTHER): Payer: Self-pay

## 2021-03-11 ENCOUNTER — Other Ambulatory Visit (INDEPENDENT_AMBULATORY_CARE_PROVIDER_SITE_OTHER): Payer: Medicare PPO

## 2021-03-11 DIAGNOSIS — D649 Anemia, unspecified: Secondary | ICD-10-CM

## 2021-03-11 LAB — COMPREHENSIVE METABOLIC PANEL
ALT: 13 U/L (ref 0–53)
AST: 16 U/L (ref 0–37)
Albumin: 4.3 g/dL (ref 3.5–5.2)
Alkaline Phosphatase: 55 U/L (ref 39–117)
BUN: 26 mg/dL — ABNORMAL HIGH (ref 6–23)
CO2: 27 mEq/L (ref 19–32)
Calcium: 9.3 mg/dL (ref 8.4–10.5)
Chloride: 102 mEq/L (ref 96–112)
Creatinine, Ser: 2.07 mg/dL — ABNORMAL HIGH (ref 0.40–1.50)
GFR: 31.41 mL/min — ABNORMAL LOW (ref >60.00)
Glucose, Bld: 256 mg/dL — ABNORMAL HIGH (ref 70–99)
Potassium: 4.4 mEq/L (ref 3.5–5.1)
Sodium: 136 mEq/L (ref 135–145)
Total Bilirubin: 0.6 mg/dL (ref 0.2–1.2)
Total Protein: 6.6 g/dL (ref 6.0–8.3)

## 2021-03-12 ENCOUNTER — Other Ambulatory Visit (HOSPITAL_BASED_OUTPATIENT_CLINIC_OR_DEPARTMENT_OTHER): Payer: Self-pay

## 2021-03-12 MED FILL — Tizanidine HCl Tab 4 MG (Base Equivalent): ORAL | 10 days supply | Qty: 40 | Fill #0 | Status: AC

## 2021-03-12 NOTE — Telephone Encounter (Signed)
Paperwork needs to be signed.  In providers folder to be signed.

## 2021-03-13 ENCOUNTER — Telehealth: Payer: Self-pay | Admitting: Endocrinology

## 2021-03-13 ENCOUNTER — Other Ambulatory Visit (HOSPITAL_BASED_OUTPATIENT_CLINIC_OR_DEPARTMENT_OTHER): Payer: Self-pay

## 2021-03-13 NOTE — Telephone Encounter (Signed)
New message    Prior authorization insulin [employer] (NOVOLIN N) 100 UNIT/ML injection  Fax # [phone]

## 2021-03-13 NOTE — Telephone Encounter (Signed)
Left message on machine for Wells Guiles to let her know we are now just waiting on a signature.

## 2021-03-14 NOTE — Telephone Encounter (Signed)
LVM for pt to cb regarding information that he may had gotten from insurance company

## 2021-03-16 ENCOUNTER — Other Ambulatory Visit (HOSPITAL_BASED_OUTPATIENT_CLINIC_OR_DEPARTMENT_OTHER): Payer: Self-pay

## 2021-03-16 ENCOUNTER — Telehealth: Payer: Self-pay

## 2021-03-16 ENCOUNTER — Encounter: Payer: Self-pay | Admitting: Family Medicine

## 2021-03-16 MED ORDER — FIASP FLEXTOUCH 100 UNIT/ML ~~LOC~~ SOPN
PEN_INJECTOR | SUBCUTANEOUS | 3 refills | Status: DC
  Filled 2021-03-16: qty 210, 88d supply, fill #0
  Filled 2021-03-17 (×2): qty 240, 89d supply, fill #0
  Filled 2021-03-17 (×2): qty 81, 30d supply, fill #0
  Filled 2021-03-17: qty 24, 9d supply, fill #0
  Filled 2021-03-23: qty 15, 6d supply, fill #0
  Filled 2021-03-31: qty 15, 6d supply, fill #1

## 2021-03-16 NOTE — Addendum Note (Signed)
Addended by: Renato Shin on: 03/16/2021 05:02 PM   Modules accepted: Orders

## 2021-03-16 NOTE — Telephone Encounter (Signed)
Patient returned call and requests to be called at ph# [phone] re: Patient's insurance will not cover Novolog and insurance company told Patient to get in touch with Dr. Loanne Drilling.

## 2021-03-16 NOTE — Telephone Encounter (Signed)
I have sent a prescription to your pharmacy, for the change

## 2021-03-16 NOTE — Telephone Encounter (Signed)
LVm for pt to let him know that the insurance company denied Novolog an Humalog and stated that he could be treated with [facility] so Loanne Drilling has sent an Rx for that medication.

## 2021-03-16 NOTE — Telephone Encounter (Signed)
Spoke with someone from [place] and I did a PA for [employer]. I also asked them if Novolog was not covered, the would Humalog be covered. They said Humalog is not covered and the next that they would cover is Fiasp.  Please Advise

## 2021-03-16 NOTE — Telephone Encounter (Signed)
Pt calling back for clarification  pervious message.

## 2021-03-17 ENCOUNTER — Other Ambulatory Visit (HOSPITAL_BASED_OUTPATIENT_CLINIC_OR_DEPARTMENT_OTHER): Payer: Self-pay

## 2021-03-17 NOTE — Patient Instructions (Addendum)
It was good to see you today - best of luck with your knee surgery We will use a steroid cream as needed for your rash- twice a day as needed Please let me know if your rash does not continue to get better

## 2021-03-17 NOTE — Progress Notes (Signed)
[facility] [address], [place], [place] [address] [phone]  Date:  03/18/2021   Name:  [name] [name]   DOB:  1946-08-23   MRN:  [id number]  PCP:  [name], MD    Chief Complaint: Rash (Rash on neck, shoulders and back, itching, 3-4 days ago)   History of Present Illness:  [name] [name] is a 75 y.o. very pleasant male patient who presents with the following:  Pt of my partner Dr Charlett Blake here today with concern of a rash I have not seen him myself in the past History of seizure disorder, CAD/ CABG, OSA  He has noted a rash on his back and chest for about 4 days.  He has not recently been doing any outdoor work, he is not sure what might of caused the rash.  It has been itchy, he has been using an over-the-counter topical steroid.  Today it actually feels quite a bit better  Otherwise he feels fine, no other concerns.  No hives, no other rashes, no oral lesions  Patient Active Problem List   Diagnosis Date Noted  . Sprain of anterior talofibular ligament of left ankle 01/08/2021  . Achilles tendon pain 06/06/2020  . Thoracic back pain 05/11/2020  . CKD (chronic kidney disease), stage IV ([place])   . Left ankle pain 12/30/2019  . Frequency of urination 05/13/2019  . Melena 04/09/2019  . Obesity 02/05/2019  . Neck pain 02/05/2019  . MCI (mild cognitive impairment) 02/05/2019  . Fall at home, initial encounter 11/06/2018  . Localized swelling of both lower extremities 07/13/2018  . Cervical radiculopathy at C8 02/08/2018  . Hypersomnolence 10/18/2017  . Vitamin D deficiency 05/17/2017  . Seizure disorder ([place]) 01/09/2017  . Atrial fibrillation ([place]) [I48.91] 07/20/2016  . Long term (current) use of anticoagulants [Z79.01] 07/20/2016  . Foraminal stenosis of cervical region 05/31/2016  . DOE (dyspnea on exertion) 05/14/2016  . Chronic diastolic CHF (congestive heart failure) ([place]) 03/13/2016  .  Headache 01/25/2016  . Carotid artery disease ([place]) 08/01/2015  . Spinal stenosis in cervical region 07/30/2015  . Occipital neuralgia of right side 07/15/2015  . Cervicogenic headache 07/15/2015  . Dizziness   . Chest pain 05/29/2015  . Medicare annual wellness visit, initial 05/11/2015  . Sun-damaged skin 05/11/2015  . Advance care planning 05/01/2015  . Post-traumatic headache 02/18/2015  . OA (osteoarthritis) of knee 01/07/2015  . Recurrent pain of right knee 05/10/2014  . Personal history of ongoing treatment with alendronate (Fosamax) 12/20/2013  . History of blood transfusion 12/20/2013  . Hyperlipidemia LDL goal <70 06/27/2013  . HLD (hyperlipidemia) 06/27/2013  . Essential hypertension 06/05/2013  . Arthritis of hand, degenerative 06/05/2013  . Acquired atrophy of thyroid 06/05/2013  . Hypothyroidism due to acquired atrophy of thyroid 06/05/2013  . Osteoarthritis of hand 06/05/2013  . Arteriosclerosis of coronary artery 05/01/2013  . Benign prostatic hyperplasia with urinary obstruction 05/01/2013  . BPH with obstruction/lower urinary tract symptoms 05/01/2013  . CAD (coronary artery disease) 05/01/2013  . Abdominal pain, unspecified site 03/06/2013  . Right shoulder pain 10/24/2012  . Eczema 10/24/2012  . Ejection fraction   . Diabetes mellitus type II, uncontrolled ([place]) 08/31/2011  . Coronary artery disease involving native coronary artery   . Anemia   . OSA (obstructive sleep apnea)   . Edema   . Falling episodes   . Hx of CABG   . Palpitations   .  Shortness of breath   . Insomnia 04/26/2011  . RLS (restless legs syndrome) 09/19/2009  . Depression 07/09/2009  . RBBB 07/09/2009  . Low back pain potentially associated with radiculopathy 06/12/2009  . SCIATICA 01/17/2009  . GERD (gastroesophageal reflux disease) 01/09/2009  . TUBULOVILLOUS ADENOMA, COLON, HX OF 01/08/2009  . Presence of drug coated stent in right coronary artery 10/29/2008  . NEPHROLITHIASIS,  HX OF 12/20/2007    Past Medical History:  Diagnosis Date  . Acquired atrophy of thyroid 06/05/2013   Overview:  July 2014: controlled on synthroid 96mcg since 2012 May 2015: decreased to Synthroid 50 mcg  Last Assessment & Plan:  Pt doing well with most recent TSH within normal range.  Will continue current dosage of Synthroid 38mcg. Pt reminded to take medication on empty stomach. Will continue to monitor with periodic laboratory assessment in 3 months.   . Anemia    hemoglobin 7.4, iron deficiency, January, 2011, 2 unit transfusion, endoscopy normal, capsule endoscopy February, 2011 no small bowel abnormalities.   Most likely source gastric erosions, followed by GI  . Anxiety   . Bradycardia   . CAD (coronary artery disease)    A. CABG in 2000,status post cardiac cath in 2006, 2009 ....continued chest pain and SOB despite oral medication adjestments including Ranexa. B. Cath November 2009/ mRCA - 2.75 x 23 Abbott Xience V drug-eluting stent ...11/26/2008 to distal  RCA leading to acute marginal.  C. Cath 07/2012 for CP - stable anatomy, med rx. d. cath 2015 and 05/30/2015 stable anatomy, consider Myoview if has CP again  . Carotid artery disease ([place]) 08/01/2015   Doppler, May 29, 2015, 1-39% bilateral ICA   . Cerebral ischemia    MRI November, 2010, chronic microvascular ischemia  . CKD (chronic kidney disease), stage III ([place])   . Concussion   . Depression    Bipolar  . Diabetes mellitus ([place])   . Edema   . Essential hypertension 06/05/2013   Overview:  July 2014: Controlled with Bystolic Sep 0938: Changed to atenolol.  Last Assessment & Plan:  Will change to atenolol for cost and follow Improved.  Medication compliance strongly encouraged BP: 116/64 mmHg    Overview:  July 2014: Controlled with Bystolic Sep 1829: Changed to atenolol.  Last Assessment & Plan:  Will change to atenolol for cost and follow  . Falling episodes    these have occurred in the past and again recurring 2011  .  Family history of adverse reaction to anesthesia    "mother died during bypass surgery but not sure if it has to do with anesthesia"  . Gastric ulcer   . GERD (gastroesophageal reflux disease)   . H/O medication noncompliance    Due to loss of insurance  . H/O multiple concussions   . Hard of hearing   . History of blood transfusion 12/20/2013  . Hx of CABG    2000,  / one median sternotomy suture broken her chest x-ray November, 2010, no clinical significance  . Hyperlipidemia   . Hypertension    pt. denies  . Iron deficiency anemia   . Long term (current) use of anticoagulants [Z79.01] 07/20/2016  . Low back pain 06/12/2009   Qualifier: Diagnosis of  By: [name]   . Nephrolithiasis   . Orthostasis   . OSA (obstructive sleep apnea)   . PAF (paroxysmal atrial fibrillation) ([place])    a. dx 2017, started on amiodarone/warfarin but patient intermittently noncompliant.  Marland Kitchen PSVT (paroxysmal  supraventricular tachycardia) ([place])   . RBBB 07/09/2009   Qualifier: Diagnosis of  By: [name], MD, [name] [name]   . RLS (restless legs syndrome) 09/19/2009   Qualifier: Diagnosis of  By: [name]   Overview:  July 2014: Controlled with Mirapex  Last Assessment & Plan:  Patient is doing well. Will continue current management and follow clinically.  . Seizure disorder ([place]) 01/09/2017  . Spondylosis    C5-6, C6-7 MRI 2010  . Syncope 03/2016  . Thyroid disease   . Tubulovillous adenoma of colon 2007  . Vitamin D deficiency 05/17/2017  . Wears glasses     Past Surgical History:  Procedure Laterality Date  . ANTERIOR CERVICAL DECOMP/DISCECTOMY FUSION N/A 05/31/2016   Procedure: ANTERIOR CERVICAL DECOMPRESSION/DISCECTOMY FUSION CERVICAL FIVE-SIX,CERVICAL SIX-SEVEN;  Surgeon: [name], MD;  Location: [facility];  Service: Neurosurgery;  Laterality: N/A;  . CARDIAC CATHETERIZATION N/A 05/30/2015   Procedure: Left Heart Cath and Coronary Angiography;  Surgeon: [name], MD;   Location: [facility];  Service: Cardiovascular;  Laterality: N/A;  . COLONOSCOPY    . CORONARY ARTERY BYPASS GRAFT     2000  . ESOPHAGOGASTRODUODENOSCOPY    . LEFT HEART CATH AND CORS/GRAFTS ANGIOGRAPHY N/A 12/15/2017   Procedure: LEFT HEART CATH AND CORS/GRAFTS ANGIOGRAPHY;  Surgeon: [name], MD;  Location: [facility];  Service: Cardiovascular;  Laterality: N/A;  . LEFT HEART CATHETERIZATION WITH CORONARY/GRAFT ANGIOGRAM N/A 08/01/2012   Procedure: LEFT HEART CATHETERIZATION WITH Beatrix Fetters;  Surgeon: [name], MD;  Location: [facility] CATH LAB;  Service: Cardiovascular;  Laterality: N/A;  . LEFT HEART CATHETERIZATION WITH CORONARY/GRAFT ANGIOGRAM N/A 01/03/2015   Procedure: LEFT HEART CATHETERIZATION WITH Beatrix Fetters;  Surgeon: [name], MD;  Location: [facility] CATH LAB;  Service: Cardiovascular;  Laterality: N/A;  . NASAL SEPTUM SURGERY     UP3  . PERCUTANEOUS CORONARY STENT INTERVENTION (PCI-S)  10/2008   mRCA PCI  2.75 x 23 Abbott Xience V drug-eluting stent   . RIGHT/LEFT HEART CATH AND CORONARY/GRAFT ANGIOGRAPHY N/A 01/07/2020   Procedure: RIGHT/LEFT HEART CATH AND CORONARY/GRAFT ANGIOGRAPHY;  Surgeon: [name], MD;  Location: [facility];  Service: Cardiovascular;  Laterality: N/A;  . ULTRASOUND GUIDANCE FOR VASCULAR ACCESS  12/15/2017   Procedure: Ultrasound Guidance For Vascular Access;  Surgeon: [name], MD;  Location: [facility];  Service: Cardiovascular;;    Social History   Tobacco Use  . Smoking status: Never Smoker  . Smokeless tobacco: Never Used  Vaping Use  . Vaping Use: Never used  Substance Use Topics  . Alcohol use: No    Alcohol/week: 0.0 standard drinks    Comment: stopped drinking in 1998  . Drug use: No    Family History  Problem Relation Age of Onset  . Pancreatic cancer Brother   . Diabetes Brother   . Coronary artery disease Brother   . Stroke Brother   .  Diabetes Brother   . Diabetes Mother   . Heart failure Mother   . Heart failure Father   . Hypothyroidism Brother   . Coronary artery disease Brother   . Other Brother        colon surgery  . Heart attack Other        Nephew  . Irregular heart beat Daughter   . Cancer Maternal Grandmother        unknown   . Colon cancer Neg Hx   .  Stomach cancer Neg Hx   . Liver cancer Neg Hx   . Rectal cancer Neg Hx   . Esophageal cancer Neg Hx     Allergies  Allergen Reactions  . Morphine Other (See Comments)    hallucinations  . Shellfish-Derived Products Itching    Medication list has been reviewed and updated.  Current Outpatient Medications on File Prior to Visit  Medication Sig Dispense Refill  . albuterol (VENTOLIN HFA) 108 (90 Base) MCG/ACT inhaler Inhale 2 puffs into the lungs every 6 (six) hours as needed for wheezing or shortness of breath. 18 g 2  . amLODipine (NORVASC) 5 MG tablet Take 1 tablet (5 mg total) by mouth at bedtime. 90 tablet 2  . apixaban (ELIQUIS) 5 MG TABS tablet TAKE 1 TABLET BY MOUTH TWICE DAILY (Patient taking differently: Take 5 mg by mouth 2 (two) times daily.) 60 tablet 5  . atorvastatin (LIPITOR) 80 MG tablet Take 1 tablet (80 mg total) by mouth daily. (Patient taking differently: Take 80 mg by mouth at bedtime.) 90 tablet 3  . cetirizine (ZYRTEC) 10 MG tablet TAKE 1 TABLET (10 MG TOTAL) BY MOUTH DAILY. (Patient taking differently: Take 10 mg by mouth daily.) 100 tablet 2  . Continuous Blood Gluc Receiver (FREESTYLE LIBRE 14 DAY READER) DEVI For continuous Blood Glucose monitoring 1 each 0  . Continuous Blood Gluc Sensor (FREESTYLE LIBRE 14 DAY SENSOR) MISC USE TO CHECK BLOOD SUGAR AND CHANGE EVERY 14 DAYS 6 each 3  . Diclofenac Sodium (PENNSAID) 2 % SOLN Place 1 application onto the skin 2 (two) times daily. 112 g 2  . famotidine (PEPCID) 20 MG tablet TAKE 1 TABLET BY MOUTH 2 TIMES DAILY (Patient taking differently: Take 40 mg by mouth daily.) 60 tablet 2   . fenofibrate (TRICOR) 145 MG tablet TAKE 1 TABLET (145 MG TOTAL) BY MOUTH DAILY. (Patient taking differently: Take 145 mg by mouth daily.) 90 tablet 1  . furosemide (LASIX) 20 MG tablet TAKE 1 TABLET BY MOUTH DAILY AS NEEDED FOR EDEMA (Patient taking differently: Take 20 mg by mouth daily as needed for edema.) 30 tablet 3  . glucose blood (ACCU-CHEK AVIVA) test strip Use as directed to check blood sugar 4 times daily.  DX E11.9 200 each 6  . HYDROcodone-acetaminophen (NORCO/VICODIN) 5-325 MG tablet TAKE 1 TABLET BY MOUTH EVERY 8 (EIGHT) HOURS AS NEEDED. (Patient taking differently: Take 1 tablet by mouth every 6 (six) hours as needed for severe pain.) 15 tablet 0  . influenza vaccine adjuvanted (FLUAD) 0.5 ML injection TAKE AS DIRECTED .5 mL 0  . insulin aspart (FIASP FLEXTOUCH) 100 UNIT/ML FlexTouch Pen Inject under the skin 3 times a day (just before each meal) 70-80-120 units (Patient taking differently: Inject under the skin 3 times a day (just before each meal) 70-80-120 units) 270 mL 3  . insulin NPH Human (NOVOLIN N) 100 UNIT/ML injection INJECT 0.2 MLS (20 UNITS TOTAL) INTO THE SKIN AT BEDTIME. (Patient taking differently: Inject 20 Units into the skin at bedtime.) 20 mL 11  . Insulin Pen Needle 31G X 8 MM MISC USE TO CHECK BLOOD SUGAR THREE TIME DAILY 100 each 5  . Insulin Syringe-Needle U-100 31G X 5/16" 1 ML MISC USE AS DIRECTED WITH NOVOLIN 100 each 0  . Insulin Syringe-Needle U-100 31G X 5/16" 1 ML MISC USE AS DIRECTED WITH NOVOLIN 100 each 0  . isosorbide mononitrate (IMDUR) 120 MG 24 hr tablet TAKE 1 TABLET (120 MG TOTAL) BY MOUTH DAILY. (Patient  taking differently: Take 120 mg by mouth daily.) 90 tablet 3  . levothyroxine (SYNTHROID) 100 MCG tablet TAKE 1 TABLET (100 MCG TOTAL) BY MOUTH DAILY. (Patient taking differently: Take 100 mcg by mouth daily.) 90 tablet 1  . metoprolol succinate (TOPROL-XL) 50 MG 24 hr tablet TAKE 1 AND 1/2 TABLETS EVERY DAY FOR A TOTAL OF 75 MG (Patient taking  differently: Take 75 mg by mouth daily.) 135 tablet 3  . nitroGLYCERIN (NITROSTAT) 0.4 MG SL tablet PLACE 1 TABLET UNDER THE TONGUE EVERY 5 MINUTES AS NEEDED FOR CHEST PAIN. (Patient taking differently: Place 0.4 mg under the tongue every 5 (five) minutes as needed for chest pain.) 25 tablet 1  . pantoprazole (PROTONIX) 40 MG tablet Take 1 tablet (40 mg total) by mouth daily. 90 tablet 3  . pramipexole (MIRAPEX) 1 MG tablet TAKE 1/2 TO 1 TABLET AT BEDTIME (Patient taking differently: Take 0.5-1 mg by mouth at bedtime.) 90 tablet 0  . ranolazine (RANEXA) 500 MG 12 hr tablet TAKE 1 TABLET BY MOUTH TWICE DAILY (Patient taking differently: Take 500 mg by mouth 2 (two) times daily.) 60 tablet 5  . Semaglutide, 1 MG/DOSE, 4 MG/3ML SOPN INJECT 1 MG INTO THE SKIN ONCE A WEEK. 9 mL 3  . sertraline (ZOLOFT) 100 MG tablet TAKE 1 TABLET BY MOUTH TWICE DAILY (Patient taking differently: Take 100 mg by mouth 2 (two) times daily.) 180 tablet 1  . tiZANidine (ZANAFLEX) 4 MG tablet TAKE 1 TABLET BY MOUTH EVERY 6 HOURS AS NEEDED FOR MUSCLE SPASMS (Patient taking differently: Take 4 mg by mouth every 6 (six) hours as needed for muscle spasms.) 40 tablet 2  . Vitamin D, Ergocalciferol, (DRISDOL) 1.25 MG (50000 UNIT) CAPS capsule TAKE 1 CAPSULE BY MOUTH EVERY 7 DAYS (Patient taking differently: Take 50,000 Units by mouth every 7 (seven) days.) 4 capsule 4   No current facility-administered medications on file prior to visit.    Review of Systems:  As per HPI- otherwise negative.   Physical Examination: Vitals:   03/18/21 1110  BP: 132/68  Pulse: 63  Resp: 18  Temp: 98.5 F (36.9 C)  SpO2: 98%   Vitals:   03/18/21 1110  Weight: 210 lb (95.3 kg)  Height: 5\' 9"  (1.753 m)   Body mass index is 31.01 kg/m. Ideal Body Weight: Weight in (lb) to have BMI = 25: 168.9  GEN: no acute distress.  Overweight, looks well Poor dentition, no oral rash however HEENT: Atraumatic, Normocephalic.  Ears and Nose: No  external deformity. CV: RRR, No M/G/R. No JVD. No thrill. No extra heart sounds. PULM: CTA B, no wheezes, crackles, rhonchi. No retractions. No resp. distress. No accessory muscle use. EXTR: No c/c/e PSYCH: Normally interactive. Conversant.  His skin displays mild erythema and excoriations on the superior chest, more towards the right side.  He has a similar but milder skin finding to the left of the thoracic spine  Assessment and Plan: Dermatitis - Plan: triamcinolone cream (KENALOG) 0.1 %  Patient seen today with concern of skin dermatitis.  He notes this is already improving on its own.  Does not appear to be anything serious, though etiology is uncertain  Prescribed triamcinolone cream to use as needed for any remaining rash.  He will let me know if not continue to improve This visit occurred during the SARS-CoV-2 public health emergency.  Safety protocols were in place, including screening questions prior to the visit, additional usage of staff PPE, and extensive cleaning of exam  room while observing appropriate contact time as indicated for disinfecting solutions.    Signed Lamar Blinks, MD

## 2021-03-18 ENCOUNTER — Other Ambulatory Visit (HOSPITAL_BASED_OUTPATIENT_CLINIC_OR_DEPARTMENT_OTHER): Payer: Self-pay

## 2021-03-18 ENCOUNTER — Other Ambulatory Visit: Payer: Self-pay

## 2021-03-18 ENCOUNTER — Ambulatory Visit (INDEPENDENT_AMBULATORY_CARE_PROVIDER_SITE_OTHER): Payer: Medicare PPO | Admitting: Family Medicine

## 2021-03-18 ENCOUNTER — Encounter: Payer: Self-pay | Admitting: Family Medicine

## 2021-03-18 VITALS — BP 132/68 | HR 63 | Temp 98.5°F | Resp 18 | Ht 69.0 in | Wt 210.0 lb

## 2021-03-18 DIAGNOSIS — L309 Dermatitis, unspecified: Secondary | ICD-10-CM

## 2021-03-18 MED ORDER — TRIAMCINOLONE ACETONIDE 0.1 % EX CREA
1.0000 "application " | TOPICAL_CREAM | Freq: Two times a day (BID) | CUTANEOUS | 1 refills | Status: DC
  Filled 2021-03-18: qty 30, 15d supply, fill #0
  Filled 2021-04-01: qty 30, 15d supply, fill #1

## 2021-03-18 NOTE — Telephone Encounter (Signed)
LVM for pt to return call

## 2021-03-18 NOTE — Telephone Encounter (Signed)
Message sent thru Mychart °

## 2021-03-18 NOTE — Telephone Encounter (Signed)
Spoke with pt and he stated that Claiborne Billings is costing $1700 and he can not afford that. I did advise him on what you said for him to go to [facility] and get Novolin R and take the same dosage that you prescribed as other medications.  Please Advise

## 2021-03-18 NOTE — Telephone Encounter (Signed)
Yes, please take [facility] R at the same dosage.

## 2021-03-18 NOTE — Telephone Encounter (Signed)
[name] [name] is a reply to [place] previous message in regards to Dr. Charlett Blake been out of the office.

## 2021-03-18 NOTE — Addendum Note (Signed)
Addended by: Kem Boroughs D on: 03/18/2021 09:56 AM   Modules accepted: Orders

## 2021-03-18 NOTE — Telephone Encounter (Signed)
Caller: Wells Guiles ([facility]) Call back [phone]  States they are able to wait till Dr. Charlett Blake return back to the office.

## 2021-03-18 NOTE — Progress Notes (Signed)
Cardiology Office Note   Date:  03/19/2021   ID:  [name] [name], DOB [birth year], MRN [id number]  PCP:  [name], MD    No chief complaint on file.  CAD/preoperative cardiovascular exam  Wt Readings from Last 3 Encounters:  03/19/21 207 lb (93.9 kg)  03/18/21 210 lb (95.3 kg)  02/26/21 200 lb (90.7 kg)       History of Present Illness: [name] [name] is a 75 y.o. male  with history ofCAD (CABG 2000, stent to RCA 2009), paroxysmal atrial fibrillation,PSVT,CKD III, noncompliance, orthostatic hypotension, iron deficiency anemia, prior gastric erosions/ulcer, ?chronic diastolic CHF (no diastolic dysfunction on echo and normal BNP), bipolar depression, DM, HTN, HLD, kidney stones, OSA, RBBB, deconditoning, thyroid disease, spondylosis and back pain.  He has prior history of chest discomfort, palpitations and dyspnearequiring med titration and repeat caths/monitorsover the years. His last PCI was in 2009. A CPX in 2011 was unrevealing, felt to reflect deconditioning and perhaps mild chronotropic incompetence. He had caths in 2015, 2016, and 12/2017 showing stable anatomy with patent bypass grafts and medical therapy recommended withpossible component of microvascular angina. In 03/2016 he was admitted near syncope and transient confusion. MRI was negative for stroke. EEG was normal. He was seen by neurology. There was no significant carotid artery stenosis on CTA. Echocardiogram demonstrated normal LV function with mild diastolic dysfunction. No arrhythmias noted on telemetry. TIA could not be ruled out. Aspirin was changed to Plavix. He was bradycardic and his beta blocker dose was reduced. He was readmitted 03/2016 with lightheadedness, confusion, slurred speech and right facial droop,left hand tremors, and intermittent hypotension. There was also reported history of occasional episodes of dizziness, slurred speech, confusion followed by lethargy. Patient would also fall  asleep easily without memory of the event.He has since been diagnosed with narcolepsy. His metoprolol was discontinued at one point but eventually restarted. He also required midodrine previously for orthostasis although does not appear to currently be on this.A monitor did demonstrate atrial fib/flutter so he was changed from Plavix to warfarin. Metoprolol was subsequently increased. In 09/2016 he was started on amiodarone. There does appear to be a history of compliance issues - notably including taking midodrine incorrectly, not reducing amiodarone as instructed (was on 400mg  daily for a while), or - per last OV 06/2018 - not filling amiodarone since 2017. At last OV he was reporting increased palpitations and decreased exercise tolerance with DOE. He was in NSR. BNP was normal. 2D echo 07/2018 showed EF 41-96%, normal diastolic function, mild LAE. Event monitor showed NSR with PACs/PVCs and short runs of SVT, lowest HR 45bpm. Carotid duplex 07/2018 showed minimal plaque without significant stenosis. He was supposed to follow-up 1 month after that visit but never came. In 11/2018 he was admitted for seizures suspected due to noncompliance for financial reasons. He also reported chest pain in setting of noncompliance.  Repeat cath in 2021. Stable anatomy and normal right heart pressures. His SHOB was better after leaving the hospital.   T[facility]  came back, specifically with bending down and standing.  Had some left facial swelling in 4/21 which we thought might be from salivary gland obstruction. Marland Kitchen CRI stable at discharge.  In 2021, he had a flu like illness, COVID negative, and he has been gradually getting more energy.  Isosorbide has helped with chest pain. Below 120 mg, he has more chest pain.   Needs TKR along with cardiac clearance.  Denies : Chest pain. Dizziness.  Nitroglycerin use. Orthopnea. Palpitations. Paroxysmal nocturnal dyspnea.  Syncope.   Activity limited by knee pain.       Past Medical History:  Diagnosis Date  . Acquired atrophy of thyroid 06/05/2013   Overview:  July 2014: controlled on synthroid 42mcg since 2012 May 2015: decreased to Synthroid 50 mcg  Last Assessment & Plan:  Pt doing well with most recent TSH within normal range.  Will continue current dosage of Synthroid 11mcg. Pt reminded to take medication on empty stomach. Will continue to monitor with periodic laboratory assessment in 3 months.   . Anemia    hemoglobin 7.4, iron deficiency, January, 2011, 2 unit transfusion, endoscopy normal, capsule endoscopy February, 2011 no small bowel abnormalities.   Most likely source gastric erosions, followed by GI  . Anxiety   . Bradycardia   . CAD (coronary artery disease)    A. CABG in 2000,status post cardiac cath in 2006, 2009 ....continued chest pain and SOB despite oral medication adjestments including Ranexa. B. Cath November 2009/ mRCA - 2.75 x 23 Abbott Xience V drug-eluting stent ...11/26/2008 to distal  RCA leading to acute marginal.  C. Cath 07/2012 for CP - stable anatomy, med rx. d. cath 2015 and 05/30/2015 stable anatomy, consider Myoview if has CP again  . Carotid artery disease ([place]) 08/01/2015   Doppler, May 29, 2015, 1-39% bilateral ICA   . Cerebral ischemia    MRI November, 2010, chronic microvascular ischemia  . CKD (chronic kidney disease), stage III ([place])   . Concussion   . Depression    Bipolar  . Diabetes mellitus ([place])   . Edema   . Essential hypertension 06/05/2013   Overview:  July 2014: Controlled with Bystolic Sep 8563: Changed to atenolol.  Last Assessment & Plan:  Will change to atenolol for cost and follow Improved.  Medication compliance strongly encouraged BP: 116/64 mmHg    Overview:  July 2014: Controlled with Bystolic Sep 1497: Changed to atenolol.  Last Assessment & Plan:  Will change to atenolol for cost and follow  . Falling episodes    these have occurred in the past and again recurring 2011  . Family history of  adverse reaction to anesthesia    "mother died during bypass surgery but not sure if it has to do with anesthesia"  . Gastric ulcer   . GERD (gastroesophageal reflux disease)   . H/O medication noncompliance    Due to loss of insurance  . H/O multiple concussions   . Hard of hearing   . History of blood transfusion 12/20/2013  . Hx of CABG    2000,  / one median sternotomy suture broken her chest x-ray November, 2010, no clinical significance  . Hyperlipidemia   . Hypertension    pt. denies  . Iron deficiency anemia   . Long term (current) use of anticoagulants [Z79.01] 07/20/2016  . Low back pain 06/12/2009   Qualifier: Diagnosis of  By: [name]   . Nephrolithiasis   . Orthostasis   . OSA (obstructive sleep apnea)   . PAF (paroxysmal atrial fibrillation) ([place])    a. dx 2017, started on amiodarone/warfarin but patient intermittently noncompliant.  Marland Kitchen PSVT (paroxysmal supraventricular tachycardia) ([place])   . RBBB 07/09/2009   Qualifier: Diagnosis of  By: [name], MD, [name] [name]   . RLS (restless legs syndrome) 09/19/2009   Qualifier: Diagnosis of  By: [name]   Overview:  July 2014: Controlled with Mirapex  Last Assessment &  Plan:  Patient is doing well. Will continue current management and follow clinically.  . Seizure disorder ([place]) 01/09/2017  . Spondylosis    C5-6, C6-7 MRI 2010  . Syncope 03/2016  . Thyroid disease   . Tubulovillous adenoma of colon 2007  . Vitamin D deficiency 05/17/2017  . Wears glasses     Past Surgical History:  Procedure Laterality Date  . ANTERIOR CERVICAL DECOMP/DISCECTOMY FUSION N/A 05/31/2016   Procedure: ANTERIOR CERVICAL DECOMPRESSION/DISCECTOMY FUSION CERVICAL FIVE-SIX,CERVICAL SIX-SEVEN;  Surgeon: [name], MD;  Location: [place] NEURO ORS;  Service: Neurosurgery;  Laterality: N/A;  . CARDIAC CATHETERIZATION N/A 05/30/2015   Procedure: Left Heart Cath and Coronary Angiography;  Surgeon: [name], MD;  Location: [place]  CV LAB;  Service: Cardiovascular;  Laterality: N/A;  . COLONOSCOPY    . CORONARY ARTERY BYPASS GRAFT     2000  . ESOPHAGOGASTRODUODENOSCOPY    . LEFT HEART CATH AND CORS/GRAFTS ANGIOGRAPHY N/A 12/15/2017   Procedure: LEFT HEART CATH AND CORS/GRAFTS ANGIOGRAPHY;  Surgeon: [name], MD;  Location: [place] CV LAB;  Service: Cardiovascular;  Laterality: N/A;  . LEFT HEART CATHETERIZATION WITH CORONARY/GRAFT ANGIOGRAM N/A 08/01/2012   Procedure: LEFT HEART CATHETERIZATION WITH Beatrix Fetters;  Surgeon: [name], MD;  Location: [facility] CATH LAB;  Service: Cardiovascular;  Laterality: N/A;  . LEFT HEART CATHETERIZATION WITH CORONARY/GRAFT ANGIOGRAM N/A 01/03/2015   Procedure: LEFT HEART CATHETERIZATION WITH Beatrix Fetters;  Surgeon: [name], MD;  [facility] C[facility] LAB;  Service: Cardiovascular;  Laterality: N/A;  . NASAL SEPTUM SURGERY     UP3  . PERCUTANEOUS CORONARY STENT INTERVENTION (PCI-S)  10/2008   mRCA PCI  2.75 x 23 Abbott Xience V drug-eluting stent   . RIGHT/LEFT HEART CATH AND CORONARY/GRAFT ANGIOGRAPHY N/A 01/07/2020   Procedure: RIGHT/LEFT HEART CATH AND CORONARY/GRAFT ANGIOGRAPHY;  Surgeon: [name], MD;  Location: [place] CV LAB;  Service: Cardiovascular;  Laterality: N/A;  . ULTRASOUND GUIDANCE FOR VASCULAR ACCESS  12/15/2017   Procedure: Ultrasound Guidance For Vascular Access;  Surgeon: [name], MD;  Location: [place] CV LAB;  Service: Cardiovascular;;     Current Outpatient Medications  Medication Sig Dispense Refill  . albuterol (VENTOLIN HFA) 108 (90 Base) MCG/ACT inhaler Inhale 2 puffs into the lungs every 6 (six) hours as needed for wheezing or shortness of breath. 18 g 2  . amLODipine (NORVASC) 5 MG tablet Take 1 tablet (5 mg total) by mouth at bedtime. 90 tablet 2  . apixaban (ELIQUIS) 5 MG TABS tablet TAKE 1 TABLET BY MOUTH TWICE DAILY (Patient taking differently: Take 5 mg by mouth 2 (two) times  daily.) 60 tablet 5  . atorvastatin (LIPITOR) 80 MG tablet Take 1 tablet (80 mg total) by mouth daily. (Patient taking differently: Take 80 mg by mouth at bedtime.) 90 tablet 3  . cetirizine (ZYRTEC) 10 MG tablet TAKE 1 TABLET (10 MG TOTAL) BY MOUTH DAILY. (Patient taking differently: Take 10 mg by mouth daily.) 100 tablet 2  . Continuous Blood Gluc Receiver (FREESTYLE LIBRE 14 DAY READER) DEVI For continuous Blood Glucose monitoring 1 each 0  . Continuous Blood Gluc Sensor (FREESTYLE LIBRE 14 DAY SENSOR) MISC USE TO CHECK BLOOD SUGAR AND CHANGE EVERY 14 DAYS 6 each 3  . Diclofenac Sodium (PENNSAID) 2 % SOLN Place 1 application onto the skin 2 (two) times daily. 112 g 2  . famotidine (PEPCID) 20 MG tablet TAKE 1 TABLET BY MOUTH 2 TIMES DAILY (Patient taking  differently: Take 40 mg by mouth daily.) 60 tablet 2  . fenofibrate (TRICOR) 145 MG tablet TAKE 1 TABLET (145 MG TOTAL) BY MOUTH DAILY. (Patient taking differently: Take 145 mg by mouth daily.) 90 tablet 1  . furosemide (LASIX) 20 MG tablet TAKE 1 TABLET BY MOUTH DAILY AS NEEDED FOR EDEMA (Patient taking differently: Take 20 mg by mouth daily as needed for edema.) 30 tablet 3  . glucose blood (ACCU-CHEK AVIVA) test strip Use as directed to check blood sugar 4 times daily.  DX E11.9 200 each 6  . HYDROcodone-acetaminophen (NORCO/VICODIN) 5-325 MG tablet TAKE 1 TABLET BY MOUTH EVERY 8 (EIGHT) HOURS AS NEEDED. (Patient taking differently: Take 1 tablet by mouth every 6 (six) hours as needed for severe pain.) 15 tablet 0  . influenza vaccine adjuvanted (FLUAD) 0.5 ML injection TAKE AS DIRECTED .5 mL 0  . insulin aspart (FIASP FLEXTOUCH) 100 UNIT/ML FlexTouch Pen Inject under the skin 3 times a day (just before each meal) 70-80-120 units (Patient taking differently: Inject under the skin 3 times a day (just before each meal) 70-80-120 units) 270 mL 3  . insulin NPH Human (NOVOLIN N) 100 UNIT/ML injection INJECT 0.2 MLS (20 UNITS TOTAL) INTO THE SKIN AT  BEDTIME. (Patient taking differently: Inject 20 Units into the skin at bedtime.) 20 mL 11  . Insulin Pen Needle 31G X 8 MM MISC USE TO CHECK BLOOD SUGAR THREE TIME DAILY 100 each 5  . Insulin Syringe-Needle U-100 31G X 5/16" 1 ML MISC USE AS DIRECTED WITH NOVOLIN 100 each 0  . Insulin Syringe-Needle U-100 31G X 5/16" 1 ML MISC USE AS DIRECTED WITH NOVOLIN 100 each 0  . isosorbide mononitrate (IMDUR) 120 MG 24 hr tablet TAKE 1 TABLET (120 MG TOTAL) BY MOUTH DAILY. (Patient taking differently: Take 120 mg by mouth daily.) 90 tablet 3  . levothyroxine (SYNTHROID) 100 MCG tablet TAKE 1 TABLET (100 MCG TOTAL) BY MOUTH DAILY. (Patient taking differently: Take 100 mcg by mouth daily.) 90 tablet 1  . metoprolol succinate (TOPROL-XL) 50 MG 24 hr tablet TAKE 1 AND 1/2 TABLETS EVERY DAY FOR A TOTAL OF 75 MG (Patient taking differently: Take 75 mg by mouth daily.) 135 tablet 3  . nitroGLYCERIN (NITROSTAT) 0.4 MG SL tablet PLACE 1 TABLET UNDER THE TONGUE EVERY 5 MINUTES AS NEEDED FOR CHEST PAIN. (Patient taking differently: Place 0.4 mg under the tongue every 5 (five) minutes as needed for chest pain.) 25 tablet 1  . pantoprazole (PROTONIX) 40 MG tablet Take 1 tablet (40 mg total) by mouth daily. 90 tablet 3  . pramipexole (MIRAPEX) 1 MG tablet TAKE 1/2 TO 1 TABLET AT BEDTIME (Patient taking differently: Take 0.5-1 mg by mouth at bedtime.) 90 tablet 0  . ranolazine (RANEXA) 500 MG 12 hr tablet TAKE 1 TABLET BY MOUTH TWICE DAILY (Patient taking differently: Take 500 mg by mouth 2 (two) times daily.) 60 tablet 5  . Semaglutide, 1 MG/DOSE, 4 MG/3ML SOPN INJECT 1 MG INTO THE SKIN ONCE A WEEK. 9 mL 3  . sertraline (ZOLOFT) 100 MG tablet TAKE 1 TABLET BY MOUTH TWICE DAILY (Patient taking differently: Take 100 mg by mouth 2 (two) times daily.) 180 tablet 1  . tiZANidine (ZANAFLEX) 4 MG tablet TAKE 1 TABLET BY MOUTH EVERY 6 HOURS AS NEEDED FOR MUSCLE SPASMS (Patient taking differently: Take 4 mg by mouth every 6 (six) hours  as needed for muscle spasms.) 40 tablet 2  . triamcinolone cream (KENALOG) 0.1 % Apply 1  application topically 2 (two) times daily. Use as needed for itchy rash 30 g 1  . Vitamin D, Ergocalciferol, (DRISDOL) 1.25 MG (50000 UNIT) CAPS capsule TAKE 1 CAPSULE BY MOUTH EVERY 7 DAYS (Patient taking differently: Take 50,000 Units by mouth every 7 (seven) days.) 4 capsule 4   No current facility-administered medications for this visit.    Allergies:   Morphine and Shellfish-derived products    Social History:  The patient  reports that he has never smoked. He has never used smokeless tobacco. He reports that he does not drink alcohol and does not use drugs.   Family History:  The patient's family history includes Cancer in his maternal grandmother; Coronary artery disease in his brother and brother; Diabetes in his brother, brother, and mother; Heart attack in an other family member; Heart failure in his father and mother; Hypothyroidism in his brother; Irregular heart beat in his daughter; Other in his brother; Pancreatic cancer in his brother; Stroke in his brother.    ROS:  Please see the history of present illness.   Otherwise, review of systems are positive for knee pain.   All other systems are reviewed and negative.    PHYSICAL EXAM: VS:  BP 108/68   Pulse 63   Ht 5\' 9"  (1.753 m)   Wt 207 lb (93.9 kg)   BMI 30.57 kg/m  , BMI Body mass index is 30.57 kg/m. GEN: Well nourished, well developed, in no acute distress  HEENT: normal  Neck: no JVD, carotid bruits, or masses Cardiac: RRR; no murmurs, rubs, or gallops,; trace right ankle edema-chronic Respiratory:  clear to auscultation bilaterally, normal work of breathing GI: soft, nontender, nondistended, + BS MS: no deformity or atrophy  Skin: Right leg scar from vein harvesting Neuro:  Strength and sensation are intact Psych: euthymic mood, full affect   EKG:   The ekg ordered today demonstrates normal sinus rhythm, right bundle  branch block, no change from prior   Recent Labs: 02/23/2021: Hemoglobin 13.6; Platelets 218.0; TSH 3.87 03/11/2021: ALT 13; BUN 26; Creatinine, Ser 2.07; Potassium 4.4; Sodium 136   Lipid Panel    Component Value Date/Time   CHOL 115 02/23/2021 1432   CHOL 99 (L) 09/27/2017 0823   TRIG 129.0 02/23/2021 1432   HDL 38.40 (L) 02/23/2021 1432   HDL 30 (L) 09/27/2017 0823   CHOLHDL 3 02/23/2021 1432   VLDL 25.8 02/23/2021 1432   LDLCALC 51 02/23/2021 1432   LDLCALC 62 08/14/2020 1433   LDLDIRECT 77.0 08/20/2019 0924     Other studies Reviewed: Additional studies/ records that were reviewed today with results demonstrating: Labs reviewed.   ASSESSMENT AND PLAN:  1. CAD: No angina.  Continue aggressive secondary prevention.  He feels that he is stable from a cardiac standpoint. 2. AFib: Eliquis for stroke prevention. 3. Hyperlipidemia: LDL 51.  Continue atorvastatin. 4. DOE/obesity: Some deconditioning.  Hopefully, after knee replacement, he will be able to be more active. 5. Anticoagulated: Eliquis for stroke prevention.  We will have to talk with Pharm.D. about when this needs to be stopped prior to surgery given his renal dysfunction. 6. DM: Whole food, plant-based diet.  Last A1c was 8.3. 7. CKD: Cr 2.14.  Avoid nephrotoxins.  Stay well-hydrated. 8. Preoperative cardiovascular exam.  No further cardiac testing needed prior to knee replacement since he had a heart catheterization in 2021 which showed stable anatomy.   Current medicines are reviewed at length with the patient today.  The patient concerns regarding  his medicines were addressed.  The following changes have been made:  No change  Labs/ tests ordered today include:  No orders of the defined types were placed in this encounter.   Recommend 150 minutes/week of aerobic exercise Low fat, low carb, high fiber diet recommended  Disposition:   FU in as scheduled in 11/22.   Signed, Larae Grooms, MD  03/19/2021  2:54 PM    [facility] [place], [place], [place]  [address] Phone: [phone]; Fax: [phone]

## 2021-03-18 NOTE — Telephone Encounter (Signed)
Patient called and requested a call back from [facility]

## 2021-03-18 NOTE — Telephone Encounter (Signed)
Did you mean unable or what you typed?

## 2021-03-19 ENCOUNTER — Encounter: Payer: Self-pay | Admitting: Interventional Cardiology

## 2021-03-19 ENCOUNTER — Ambulatory Visit: Payer: Medicare PPO | Admitting: Interventional Cardiology

## 2021-03-19 VITALS — BP 108/68 | HR 63 | Ht 69.0 in | Wt 207.0 lb

## 2021-03-19 DIAGNOSIS — I5032 Chronic diastolic (congestive) heart failure: Secondary | ICD-10-CM

## 2021-03-19 DIAGNOSIS — N189 Chronic kidney disease, unspecified: Secondary | ICD-10-CM | POA: Diagnosis not present

## 2021-03-19 DIAGNOSIS — I480 Paroxysmal atrial fibrillation: Secondary | ICD-10-CM

## 2021-03-19 DIAGNOSIS — Z01810 Encounter for preprocedural cardiovascular examination: Secondary | ICD-10-CM

## 2021-03-19 DIAGNOSIS — I25700 Atherosclerosis of coronary artery bypass graft(s), unspecified, with unstable angina pectoris: Secondary | ICD-10-CM | POA: Diagnosis not present

## 2021-03-19 DIAGNOSIS — E785 Hyperlipidemia, unspecified: Secondary | ICD-10-CM

## 2021-03-19 NOTE — Patient Instructions (Signed)
Medication Instructions:  Your physician recommends that you continue on your current medications as directed. Please refer to the Current Medication list given to you today.  *If you need a refill on your cardiac medications before your next appointment, please call your pharmacy*   Lab Work: none If you have labs (blood work) drawn today and your tests are completely normal, you will receive your results only by: Marland Kitchen MyChart Message (if you have MyChart) OR . A paper copy in the mail If you have any lab test that is abnormal or we need to change your treatment, we will call you to review the results.   Testing/Procedures: none   Follow-Up: At [facility], you and your health needs are our priority.  As part of our continuing mission to provide you with exceptional heart care, we have created designated Provider Care Teams.  These Care Teams include your primary Cardiologist (physician) and Advanced Practice Providers (APPs -  Physician Assistants and Nurse Practitioners) who all work together to provide you with the care you need, when you need it.  We recommend signing up for the patient portal called "MyChart".  Sign up information is provided on this After Visit Summary.  MyChart is used to connect with patients for Virtual Visits (Telemedicine).  Patients are able to view lab/test results, encounter notes, upcoming appointments, etc.  Non-urgent messages can be sent to your provider as well.   To learn more about what you can do with MyChart, go to [email or web address].    Your next appointment:   November 2022  The format for your next appointment:   In Person  Provider:   You may see Larae Grooms, MD or one of the following Advanced Practice Providers on your designated Care Team:    Melina Copa, PA-C  Ermalinda Barrios, PA-C    Other Instructions

## 2021-03-19 NOTE — Telephone Encounter (Signed)
Pt seen in office today by Dr Irish Lack and cleared for surgery - "Preoperative cardiovascular exam.  No further cardiac testing needed prior to knee replacement since he had a heart catheterization in 2021 which showed stable anatomy." Prior PharmD clearance approved pt to hold Eliquis for 3 days prior to upcoming procedure.

## 2021-03-20 NOTE — Telephone Encounter (Signed)
    [name] [name] DOB:  [birth year]  MRN:  [id number]   Primary Cardiologist: Larae Grooms, MD  Chart reviewed as part of pre-operative protocol coverage. Given past medical history and time since last visit, based on ACC/AHA guidelines, [name] [name] would be at acceptable risk for the planned procedure without further cardiovascular testing. Seen in the office 03/19/21 for pre-op evaluation by [name]. Per MD note "Preoperative cardiovascular exam.No further cardiac testing needed prior to knee replacement since he had a heart catheterization in 2021 which showed stable anatomy." Prior PharmD clearance approved pt to hold Eliquis for 3 days prior to upcoming procedure.  I will route this recommendation to the requesting party via Epic fax function and remove from pre-op pool.  Please call with questions.  [name] Schildt Ninfa Meeker, PA-C 03/20/2021, 8:54 AM

## 2021-03-23 ENCOUNTER — Other Ambulatory Visit (HOSPITAL_BASED_OUTPATIENT_CLINIC_OR_DEPARTMENT_OTHER): Payer: Self-pay

## 2021-03-23 ENCOUNTER — Telehealth: Payer: Self-pay

## 2021-03-23 NOTE — Telephone Encounter (Signed)
Form faxed to [phone] [facility] notified that fax was sent.

## 2021-03-23 NOTE — Care Plan (Signed)
Ortho Bundle Case Management Note  Patient Details  Name: [name] [name] MRN: [id number] Date of Birth: 1946-09-01   spoke with patient prior to surgery. He will discharge to home with family to assist. Has equipment at home. HHPT referral to [facility] set up with [facility]. Patient and MD in agreement with plan. Choice offered                    DME Arranged:    DME Agency:     HH Arranged:  PT HH Agency:  [facility]  Additional Comments: Please contact me with any questions of if this plan should need to change.  Ladell Heads,  [facility]  [phone] 03/23/2021, 4:51 PM

## 2021-03-23 NOTE — Telephone Encounter (Signed)
done

## 2021-03-23 NOTE — Telephone Encounter (Signed)
[facility] called wanting to know the status of patient's surgical clearance , and if patient is cleared for surgery it would need to be in writting.    Call back   [phone] ext. [place]

## 2021-03-24 NOTE — Patient Instructions (Addendum)
DUE TO COVID-19 ONLY ONE VISITOR IS ALLOWED TO COME WITH YOU AND STAY IN THE WAITING ROOM ONLY DURING PRE OP AND PROCEDURE DAY OF SURGERY. THE 1 VISITOR  MAY VISIT WITH YOU AFTER SURGERY IN YOUR PRIVATE ROOM DURING VISITING HOURS ONLY!  YOU NEED TO HAVE A COVID 19 TEST ON: 03/27/21 @              , THIS TEST MUST BE DONE BEFORE SURGERY,  COVID TESTING SITE [place] [id number], IT IS ON THE RIGHT GOING OUT [name] APPROXIMATELY  2 MINUTES PAST [employer] ON THE RIGHT. ONCE YOUR COVID TEST IS COMPLETED,  PLEASE BEGIN THE QUARANTINE INSTRUCTIONS AS OUTLINED IN YOUR HANDOUT.                Cinda Quest Garbers    Your procedure is scheduled on: 03/31/21   Report to [facility] Main  Entrance   Report to admitting at : 9:50 AM     Call this number if you have problems the morning of surgery [phone]    Remember: NO SOLID FOOD AFTER MIDNIGHT THE NIGHT PRIOR TO SURGERY. NOTHING BY MOUTH EXCEPT CLEAR LIQUIDS UNTIL: 9:20 AM.   CLEAR LIQUID DIET  Foods Allowed                                                                     Foods Excluded  Coffee and tea, regular and decaf                             liquids that you cannot  Plain Jell-O any favor except red or purple                                           see through such as: Fruit ices (not with fruit pulp)                                     milk, soups, orange juice  Iced Popsicles                                    All solid food Carbonated beverages, regular and diet                                    Cranberry, grape and apple juices Sports drinks like Gatorade Lightly seasoned clear broth or consume(fat free) Sugar, honey syrup  Sample Menu Breakfast                                Lunch  Supper Cranberry juice                    Beef broth                            Chicken broth Jell-O                                     Grape juice                            Apple juice Coffee or tea                        Jell-O                                      Popsicle                                                Coffee or tea                        Coffee or tea  _____________________________________________________________________  BRUSH YOUR TEETH MORNING OF SURGERY AND RINSE YOUR MOUTH OUT, NO CHEWING GUM CANDY OR MINTS.    Take these medicines the morning of surgery with A SIP OF WATER:amlodipine,cetirizine,famotidine,isosorbide,ranolazine,levothyroxine,metoprolol,pantoprazole,sertraline. Use inhalers as usual.  How to Manage Your Diabetes Before and After Surgery  Why is it important to control my blood sugar before and after surgery? . Improving blood sugar levels before and after surgery helps healing and can limit problems. . A way of improving blood sugar control is eating a healthy diet by: o  Eating less sugar and carbohydrates o  Increasing activity/exercise o  Talking with your doctor about reaching your blood sugar goals . High blood sugars (greater than 180 mg/dL) can raise your risk of infections and slow your recovery, so you will need to focus on controlling your diabetes during the weeks before surgery. . Make sure that the doctor who takes care of your diabetes knows about your planned surgery including the date and location.  How do I manage my blood sugar before surgery? . Check your blood sugar at least 4 times a day, starting 2 days before surgery, to make sure that the level is not too high or low. o Check your blood sugar the morning of your surgery when you wake up and every 2 hours until you get to the Short Stay unit. . If your blood sugar is less than 70 mg/dL, you will need to treat for low blood sugar: o Do not take insulin. o Treat a low blood sugar (less than 70 mg/dL) with  cup of clear juice (cranberry or apple), 4 glucose tablets, OR glucose gel. o Recheck blood sugar in 15 minutes after treatment  (to make sure it is greater than 70 mg/dL). If your blood sugar is not greater than 70 mg/dL on recheck, call [phone] for further instructions. . Report your blood sugar to the short stay nurse when you get to Short Stay.  . If  you are admitted to the hospital after surgery: o Your blood sugar will be checked by the staff and you will probably be given insulin after surgery (instead of oral diabetes medicines) to make sure you have good blood sugar levels. o The goal for blood sugar control after surgery is 80-180 mg/dL.   WHAT DO I DO ABOUT MY DIABETES MEDICATION?  Marland Kitchen Do not take oral diabetes medicines (pills) the morning of surgery.  . THE DAY BEFORE SURGERY, use aspart insulin as usual for breakfast and lunch, DO NOT take the evening dose. Take half of the NPH insulin dose.      . THE MORNING OF SURGERY:  . If your CBG is greater than 220 mg/dL, you may take  of your sliding scale  . (correction) dose of insulin.                              You may not have any metal on your body including hair pins and              piercings  Do not wear jewelry, make-up, lotions, powders or perfumes, deodorant             Do not wear nail polish on your fingernails.  Do not shave  48 hours prior to surgery.              Men may shave face and neck.   Do not bring valuables to the hospital. [place].  Contacts, dentures or bridgework may not be worn into surgery.  Leave suitcase in the car. After surgery it may be brought to your room.     Patients discharged the day of surgery will not be allowed to drive home. IF YOU ARE HAVING SURGERY AND GOING HOME THE SAME DAY, YOU MUST HAVE AN ADULT TO DRIVE YOU HOME AND BE WITH YOU FOR 24 HOURS. YOU MAY GO HOME BY TAXI OR UBER OR ORTHERWISE, BUT AN ADULT MUST ACCOMPANY YOU HOME AND STAY WITH YOU FOR 24 HOURS.  Name and phone number of your driver:  Special Instructions: N/A              Please read  over the following fact sheets you were given: _____________________________________________________________________    PLEASE BRING CPAP MASK [place]. DEVICE WILL BE PROVIDED!       [place] - Preparing for Surgery Before surgery, you can play an important role.  Because skin is not sterile, your skin needs to be as free of germs as possible.  You can reduce the number of germs on your skin by washing with CHG (chlorahexidine gluconate) soap before surgery.  CHG is an antiseptic cleaner which kills germs and bonds with the skin to continue killing germs even after washing. Please DO NOT use if you have an allergy to CHG or antibacterial soaps.  If your skin becomes reddened/irritated stop using the CHG and inform your nurse when you arrive at Short Stay. Do not shave (including legs and underarms) for at least 48 hours prior to the first CHG shower.  You may shave your face/neck. Please follow these instructions carefully:  1.  Shower with CHG Soap the night before surgery and the  morning of Surgery.  2.  If you choose to wash your hair, wash your  hair first as usual with your  normal  shampoo.  3.  After you shampoo, rinse your hair and body thoroughly to remove the  shampoo.                           4.  Use CHG as you would any other liquid soap.  You can apply chg directly  to the skin and wash                       Gently with a scrungie or clean washcloth.  5.  Apply the CHG Soap to your body ONLY FROM THE NECK DOWN.   Do not use on face/ open                           Wound or open sores. Avoid contact with eyes, ears mouth and genitals (private parts).                       Wash face,  Genitals (private parts) with your normal soap.             6.  Wash thoroughly, paying special attention to the area where your surgery  will be performed.  7.  Thoroughly rinse your body with warm water from the neck down.  8.  DO NOT shower/wash with your normal soap after using and rinsing  off  the CHG Soap.                9.  Pat yourself dry with a clean towel.            10.  Wear clean pajamas.            11.  Place clean sheets on your bed the night of your first shower and do not  sleep with pets. Day of Surgery : Do not apply any lotions/deodorants the morning of surgery.  Please wear clean clothes to the hospital/surgery center.  FAILURE TO FOLLOW THESE INSTRUCTIONS MAY RESULT IN THE CANCELLATION OF YOUR SURGERY PATIENT SIGNATURE_________________________________  NURSE SIGNATURE__________________________________  ________________________________________________________________________   Adam Phenix  An incentive spirometer is a tool that can help keep your lungs clear and active. This tool measures how well you are filling your lungs with each breath. Taking long deep breaths may help reverse or decrease the chance of developing breathing (pulmonary) problems (especially infection) following:  A long period of time when you are unable to move or be active. BEFORE THE PROCEDURE   If the spirometer includes an indicator to show your best effort, your nurse or respiratory therapist will set it to a desired goal.  If possible, sit up straight or lean slightly forward. Try not to slouch.  Hold the incentive spirometer in an upright position. INSTRUCTIONS FOR USE  1. Sit on the edge of your bed if possible, or sit up as far as you can in bed or on a chair. 2. Hold the incentive spirometer in an upright position. 3. Breathe out normally. 4. Place the mouthpiece in your mouth and seal your lips tightly around it. 5. Breathe in slowly and as deeply as possible, raising the piston or the ball toward the top of the column. 6. Hold your breath for 3-5 seconds or for as long as possible. Allow the piston or ball to fall to the bottom of the column. 7. Remove  the mouthpiece from your mouth and breathe out normally. 8. Rest for a few seconds and repeat Steps 1  through 7 at least 10 times every 1-2 hours when you are awake. Take your time and take a few normal breaths between deep breaths. 9. The spirometer may include an indicator to show your best effort. Use the indicator as a goal to work toward during each repetition. 10. After each set of 10 deep breaths, practice coughing to be sure your lungs are clear. If you have an incision (the cut made at the time of surgery), support your incision when coughing by placing a pillow or rolled up towels firmly against it. Once you are able to get out of bed, walk around indoors and cough well. You may stop using the incentive spirometer when instructed by your caregiver.  RISKS AND COMPLICATIONS  Take your time so you do not get dizzy or light-headed.  If you are in pain, you may need to take or ask for pain medication before doing incentive spirometry. It is harder to take a deep breath if you are having pain. AFTER USE  Rest and breathe slowly and easily.  It can be helpful to keep track of a log of your progress. Your caregiver can provide you with a simple table to help with this. If you are using the spirometer at home, follow these instructions: [place] IF:   You are having difficultly using the spirometer.  You have trouble using the spirometer as often as instructed.  Your pain medication is not giving enough relief while using the spirometer.  You develop fever of 100.5 F (38.1 C) or higher. SEEK IMMEDIATE MEDICAL CARE IF:   You cough up bloody sputum that had not been present before.  You develop fever of 102 F (38.9 C) or greater.  You develop worsening pain at or near the incision site. MAKE SURE YOU:   Understand these instructions.  Will watch your condition.  Will get help right away if you are not doing well or get worse. Document Released: 04/04/2007 Document Revised: 02/14/2012 Document Reviewed: 06/05/2007 [facility] Patient Information 2014 [place],  [place].   ________________________________________________________________________

## 2021-03-25 ENCOUNTER — Other Ambulatory Visit: Payer: Self-pay

## 2021-03-25 ENCOUNTER — Encounter (HOSPITAL_COMMUNITY)
Admission: RE | Admit: 2021-03-25 | Discharge: 2021-03-25 | Disposition: A | Discharge status: Home / Self Care | Payer: Medicare PPO | Source: Ambulatory Visit | Attending: Orthopaedic Surgery | Admitting: Orthopaedic Surgery

## 2021-03-25 ENCOUNTER — Encounter (HOSPITAL_COMMUNITY): Payer: Self-pay

## 2021-03-25 ENCOUNTER — Ambulatory Visit (HOSPITAL_COMMUNITY)
Admission: RE | Admit: 2021-03-25 | Discharge: 2021-03-25 | Disposition: A | Discharge status: Home / Self Care | Payer: Medicare PPO | Source: Ambulatory Visit | Attending: Orthopaedic Surgery | Admitting: Orthopaedic Surgery

## 2021-03-25 DIAGNOSIS — N183 Chronic kidney disease, stage 3 unspecified: Secondary | ICD-10-CM | POA: Diagnosis not present

## 2021-03-25 DIAGNOSIS — Z01818 Encounter for other preprocedural examination: Secondary | ICD-10-CM | POA: Diagnosis not present

## 2021-03-25 DIAGNOSIS — I10 Essential (primary) hypertension: Secondary | ICD-10-CM | POA: Insufficient documentation

## 2021-03-25 DIAGNOSIS — Z794 Long term (current) use of insulin: Secondary | ICD-10-CM | POA: Insufficient documentation

## 2021-03-25 DIAGNOSIS — Z951 Presence of aortocoronary bypass graft: Secondary | ICD-10-CM | POA: Insufficient documentation

## 2021-03-25 DIAGNOSIS — E119 Type 2 diabetes mellitus without complications: Secondary | ICD-10-CM | POA: Insufficient documentation

## 2021-03-25 DIAGNOSIS — Z7984 Long term (current) use of oral hypoglycemic drugs: Secondary | ICD-10-CM | POA: Insufficient documentation

## 2021-03-25 DIAGNOSIS — Z7901 Long term (current) use of anticoagulants: Secondary | ICD-10-CM | POA: Insufficient documentation

## 2021-03-25 DIAGNOSIS — I480 Paroxysmal atrial fibrillation: Secondary | ICD-10-CM | POA: Diagnosis not present

## 2021-03-25 DIAGNOSIS — Z79899 Other long term (current) drug therapy: Secondary | ICD-10-CM | POA: Diagnosis not present

## 2021-03-25 DIAGNOSIS — I2510 Atherosclerotic heart disease of native coronary artery without angina pectoris: Secondary | ICD-10-CM | POA: Insufficient documentation

## 2021-03-25 LAB — BASIC METABOLIC PANEL
Anion gap: 9 (ref 5–15)
BUN: 24 mg/dL — ABNORMAL HIGH (ref 8–23)
CO2: 22 mmol/L (ref 22–32)
Calcium: 9.4 mg/dL (ref 8.9–10.3)
Chloride: 106 mmol/L (ref 98–111)
Creatinine, Ser: 2.23 mg/dL — ABNORMAL HIGH (ref 0.61–1.24)
GFR, Estimated: 31 mL/min — ABNORMAL LOW (ref >60)
Glucose, Bld: 291 mg/dL — ABNORMAL HIGH (ref 70–99)
Potassium: 4.3 mmol/L (ref 3.5–5.1)
Sodium: 137 mmol/L (ref 135–145)

## 2021-03-25 LAB — SURGICAL PCR SCREEN
MRSA, PCR: NEGATIVE
Staphylococcus aureus: POSITIVE — AB

## 2021-03-25 LAB — CBC WITH DIFFERENTIAL/PLATELET
Abs Immature Granulocytes: 0.03 10*3/uL (ref 0.00–0.07)
Basophils Absolute: 0 10*3/uL (ref 0.0–0.1)
Basophils Relative: 1 %
Eosinophils Absolute: 0.2 10*3/uL (ref 0.0–0.5)
Eosinophils Relative: 4 %
HCT: 37.7 % — ABNORMAL LOW (ref 39.0–52.0)
Hemoglobin: 12.8 g/dL — ABNORMAL LOW (ref 13.0–17.0)
Immature Granulocytes: 1 %
Lymphocytes Relative: 22 %
Lymphs Abs: 1.5 10*3/uL (ref 0.7–4.0)
MCH: 30.6 pg (ref 26.0–34.0)
MCHC: 34 g/dL (ref 30.0–36.0)
MCV: 90.2 fL (ref 80.0–100.0)
Monocytes Absolute: 0.5 10*3/uL (ref 0.1–1.0)
Monocytes Relative: 7 %
Neutro Abs: 4.4 10*3/uL (ref 1.7–7.7)
Neutrophils Relative %: 65 %
Platelets: 223 10*3/uL (ref 150–400)
RBC: 4.18 MIL/uL — ABNORMAL LOW (ref 4.22–5.81)
RDW: 13.9 % (ref 11.5–15.5)
WBC: 6.6 10*3/uL (ref 4.0–10.5)
nRBC: 0 % (ref 0.0–0.2)

## 2021-03-25 LAB — URINALYSIS, ROUTINE W REFLEX MICROSCOPIC
Bilirubin Urine: NEGATIVE
Glucose, UA: >=500 mg/dL — AB
Hgb urine dipstick: NEGATIVE
Ketones, ur: NEGATIVE mg/dL
Leukocytes,Ua: NEGATIVE
Nitrite: NEGATIVE
Protein, ur: NEGATIVE mg/dL
Specific Gravity, Urine: 1.016 (ref 1.005–1.030)
pH: 5 (ref 5.0–8.0)

## 2021-03-25 LAB — GLUCOSE, CAPILLARY: Glucose-Capillary: 297 mg/dL — ABNORMAL HIGH (ref 70–99)

## 2021-03-25 LAB — HEMOGLOBIN A1C
Hgb A1c MFr Bld: 8.9 % — ABNORMAL HIGH (ref 4.8–5.6)
Mean Plasma Glucose: 208.73 mg/dL

## 2021-03-25 LAB — PROTIME-INR
INR: 1.3 — ABNORMAL HIGH (ref 0.8–1.2)
Prothrombin Time: 15.7 seconds — ABNORMAL HIGH (ref 11.4–15.2)

## 2021-03-25 LAB — APTT: aPTT: 34 seconds (ref 24–36)

## 2021-03-25 NOTE — Progress Notes (Signed)
COVID Vaccine Completed: Yes Date COVID Vaccine completed: 09/12/20. [place] COVID vaccine manufacturer: Pfizer     PCP - Dr. Penni Homans Cardiologist - Dr. Larae Grooms. LOV: 03/19/21.Clearance: 03/05/21: EPIC  Chest x-ray -  EKG - 03/19/21 Stress Test -  ECHO - 01/08/20 Cardiac Cath -01/07/20  Pacemaker/ICD device last checked: A1-C: 9 @ 02/23/21 Sleep Study -  CPAP -   Fasting Blood Sugar - 90's-100's Checks Blood Sugar __4___ times a day  Blood Thinner Instructions: No yet. On Eliquis. Aspirin Instructions: Last Dose:  Anesthesia review: Hx: CAD,HTN,Rt. BBB,CKD III,DIA,CABBG,OSA(CPAP),PAF,PSVT. Pt. Is very active,he can climb a flight of staris without getting SOB.  Patient denies shortness of breath, fever, cough and chest pain at PAT appointment   Patient verbalized understanding of instructions that were given to them at the PAT appointment. Patient was also instructed that they will need to review over the PAT instructions again at home before surgery.

## 2021-03-26 ENCOUNTER — Encounter (HOSPITAL_COMMUNITY): Payer: Self-pay | Admitting: Emergency Medicine

## 2021-03-26 ENCOUNTER — Telehealth: Payer: Self-pay | Admitting: Endocrinology

## 2021-03-26 NOTE — Progress Notes (Signed)
Anesthesia Chart Review:   Case: [id number] Date/Time: 03/31/21 1207   Procedure: RIGHT TOTAL KNEE ARTHROPLASTY (Right Knee)   Anesthesia type: Spinal   Pre-op diagnosis: RIGHT KNEE DEGENERATIVE JOINT DISEASE   Location: [facility] 06 / [facility]   Surgeons: [name], MD      DISCUSSION: Pt is 75 years old with hx CAD (s/p CABG 2000; DES to RCA 2009), PAF, PSVT, HTN, carotid artery disease, DM, OSA, iron deficiency anemia, seizure disorder, CKD (stage 3)  Pt to hold eliquis 3 days before surgery  Pt's HbA1c is 8.9. I notified Wells Guiles in Dr. Jerald Kief office and routed lab results to his office for review.    VS: BP (!) 143/58   Pulse 76   Temp 36.7 C (Oral)   Ht 5\' 9"  (1.753 m)   Wt 93.4 kg   SpO2 97%   BMI 30.42 kg/m    PROVIDERS: - PCP is [name], MD - Cardiologist is Larae Grooms, MD. Last office visit 03/19/21   LABS:  - glucose 291, HbA1c 8.9 - Cr 2.23, BUN 24. This is consistent with prior results - PT 15.7. Pt is taking eliquis.   (all labs ordered are listed, but only abnormal results are displayed)  Labs Reviewed  SURGICAL PCR SCREEN - Abnormal; Notable for the following components:      Result Value   Staphylococcus aureus POSITIVE (*)    All other components within normal limits  CBC WITH DIFFERENTIAL/PLATELET - Abnormal; Notable for the following components:   RBC 4.18 (*)    Hemoglobin 12.8 (*)    HCT 37.7 (*)    All other components within normal limits  BASIC METABOLIC PANEL - Abnormal; Notable for the following components:   Glucose, Bld 291 (*)    BUN 24 (*)    Creatinine, Ser 2.23 (*)    GFR, Estimated 31 (*)    All other components within normal limits  PROTIME-INR - Abnormal; Notable for the following components:   Prothrombin Time 15.7 (*)    INR 1.3 (*)    All other components within normal limits  URINALYSIS, ROUTINE W REFLEX MICROSCOPIC - Abnormal; Notable for the following components:   Glucose, UA >=500 (*)     Bacteria, UA RARE (*)    All other components within normal limits  GLUCOSE, CAPILLARY - Abnormal; Notable for the following components:   Glucose-Capillary 297 (*)    All other components within normal limits  HEMOGLOBIN A1C - Abnormal; Notable for the following components:   Hgb A1c MFr Bld 8.9 (*)    All other components within normal limits  APTT  TYPE AND SCREEN     IMAGES: CXR 03/25/21: No active cardiopulmonary disease.  Renal US 01/05/20:  No cause for chronic renal disease identified.  Normal study  EKG 03/19/21: NSR RBBB   CV: Cardiac event monitor 09/01/20:   Paroxysmal AFib (1%), but mostly NSR (99%).  Rates mostly well controlled. Average AFib 94 bpm, average sinus rate 69 bpm.  Majority of his symptoms occurred while he was in NSR. - Continue Eliquis for stroke prevention.     Echo 01/08/20:  1. Left ventricular ejection fraction, by visual estimation, is 60 to 65%. The left ventricle has normal function. Mildly increased left ventricular posterior wall thickness. There is mildly increased left ventricular hypertrophy of the basal septum.  2. The left ventricle has no regional wall motion abnormalities.  3. Left ventricular diastolic parameters are indeterminate.  4. Elevated left  ventricular end-diastolic pressure.  5. Global right ventricle has normal systolic function.The right ventricular size is normal. No increase in right ventricular wall thickness.  6. Left atrial size was normal.  7. Right atrial size was normal.  8. The mitral valve is normal in structure. Mild mitral valve regurgitation. No evidence of mitral stenosis.  9. The tricuspid valve is normal in structure. Tricuspid valve regurgitation is not demonstrated.  10. The aortic valve is normal in structure. Aortic valve regurgitation is not visualized. No evidence of aortic valve sclerosis or stenosis.  11. The pulmonic valve was normal in structure. Pulmonic valve regurgitation is trivial.   12. The inferior vena cava is normal in size with <50% respiratory variability, suggesting right atrial pressure of 8 mmHg.    R/L Cardiac cath 01/07/20:   Right heart cath demonstrated normal filling pressures and pulmonary artery pressure.  Total occlusion of the mid LAD and diagonal.  Ramus intermedius is also totally occluded.  Mild to moderate disease is noted in the distal circumflex.  The native right coronary is totally occluded beyond the origin of the PDA.  The bypass graft to the continuation of the right coronary is widely patent.  The bypass graft to the diagonal is widely patent.  The bypass graft to the ramus intermedius is widely patent.  LIMA to the LAD is widely patent.  Left ventricular function was not assessed with this study.  Left ventricular end-diastolic pressure was normal. RECOMMENDATIONS:  In review of images, no significant change has occurred compared to 2019.  If angina, consider microvascular origin.  Uncertain explanation for the patient's dyspnea.     Past Medical History:  Diagnosis Date  . Acquired atrophy of thyroid 06/05/2013   Overview:  July 2014: controlled on synthroid 59mcg since 2012 May 2015: decreased to Synthroid 50 mcg  Last Assessment & Plan:  Pt doing well with most recent TSH within normal range.  Will continue current dosage of Synthroid 3mcg. Pt reminded to take medication on empty stomach. Will continue to monitor with periodic laboratory assessment in 3 months.   . Anemia    hemoglobin 7.4, iron deficiency, January, 2011, 2 unit transfusion, endoscopy normal, capsule endoscopy February, 2011 no small bowel abnormalities.   Most likely source gastric erosions, followed by GI  . Anxiety   . Bradycardia   . CAD (coronary artery disease)    A. CABG in 2000,status post cardiac cath in 2006, 2009 ....continued chest pain and SOB despite oral medication adjestments including Ranexa. B. Cath November 2009/ mRCA - 2.75 x 23 Abbott  Xience V drug-eluting stent ...11/26/2008 to distal  RCA leading to acute marginal.  C. Cath 07/2012 for CP - stable anatomy, med rx. d. cath 2015 and 05/30/2015 stable anatomy, consider Myoview if has CP again  . Carotid artery disease ([place]) 08/01/2015   Doppler, May 29, 2015, 1-39% bilateral ICA   . Cerebral ischemia    MRI November, 2010, chronic microvascular ischemia  . CKD (chronic kidney disease), stage III ([place])   . Concussion   . Depression    Bipolar  . Diabetes mellitus ([place])   . Edema   . Essential hypertension 06/05/2013   Overview:  July 2014: Controlled with Bystolic Sep 1093: Changed to atenolol.  Last Assessment & Plan:  Will change to atenolol for cost and follow Improved.  Medication compliance strongly encouraged BP: 116/64 mmHg    Overview:  July 2014: Controlled with Bystolic Sep 2355: Changed to atenolol.  Last Assessment &  Plan:  Will change to atenolol for cost and follow  . Falling episodes    these have occurred in the past and again recurring 2011  . Family history of adverse reaction to anesthesia    "mother died during bypass surgery but not sure if it has to do with anesthesia"  . Gastric ulcer   . GERD (gastroesophageal reflux disease)   . H/O medication noncompliance    Due to loss of insurance  . H/O multiple concussions   . Hard of hearing   . History of blood transfusion 12/20/2013  . Hx of CABG    2000,  / one median sternotomy suture broken her chest x-ray November, 2010, no clinical significance  . Hyperlipidemia   . Hypertension    pt. denies  . Iron deficiency anemia   . Long term (current) use of anticoagulants [Z79.01] 07/20/2016  . Low back pain 06/12/2009   Qualifier: Diagnosis of  By: [name]   . Nephrolithiasis   . Orthostasis   . OSA (obstructive sleep apnea)   . PAF (paroxysmal atrial fibrillation) ([place])    a. dx 2017, started on amiodarone/warfarin but patient intermittently noncompliant.  Marland Kitchen PSVT (paroxysmal supraventricular  tachycardia) ([place])   . RBBB 07/09/2009   Qualifier: Diagnosis of  By: [name], MD, [name] [name]   . RLS (restless legs syndrome) 09/19/2009   Qualifier: Diagnosis of  By: [name]   Overview:  July 2014: Controlled with Mirapex  Last Assessment & Plan:  Patient is doing well. Will continue current management and follow clinically.  . Seizure disorder ([place]) 01/09/2017  . Spondylosis    C5-6, C6-7 MRI 2010  . Syncope 03/2016  . Thyroid disease   . Tubulovillous adenoma of colon 2007  . Vitamin D deficiency 05/17/2017  . Wears glasses     Past Surgical History:  Procedure Laterality Date  . ANTERIOR CERVICAL DECOMP/DISCECTOMY FUSION N/A 05/31/2016   Procedure: ANTERIOR CERVICAL DECOMPRESSION/DISCECTOMY FUSION CERVICAL FIVE-SIX,CERVICAL SIX-SEVEN;  Surgeon: [name], MD;  Location: [place] [facility];  Service: Neurosurgery;  Laterality: N/A;  . CARDIAC CATHETERIZATION N/A 05/30/2015   Procedure: Left Heart Cath and Coronary Angiography;  Surgeon: [name], MD;  Location: [place] CV LAB;  Service: Cardiovascular;  Laterality: N/A;  . COLONOSCOPY    . CORONARY ARTERY BYPASS GRAFT     2000  . ESOPHAGOGASTRODUODENOSCOPY    . LEFT HEART CATH AND CORS/GRAFTS ANGIOGRAPHY N/A 12/15/2017   Procedure: LEFT HEART CATH AND CORS/GRAFTS ANGIOGRAPHY;  Surgeon: [name], MD;  Location: [place] CV LAB;  Service: Cardiovascular;  Laterality: N/A;  . LEFT HEART CATHETERIZATION WITH CORONARY/GRAFT ANGIOGRAM N/A 08/01/2012   Procedure: LEFT HEART CATHETERIZATION WITH Beatrix Fetters;  Surgeon: [name], MD;  Location: [facility] CATH LAB;  Service: Cardiovascular;  Laterality: N/A;  . LEFT HEART CATHETERIZATION WITH CORONARY/GRAFT ANGIOGRAM N/A 01/03/2015   Procedure: LEFT HEART CATHETERIZATION WITH Beatrix Fetters;  Surgeon: [name], MD;  Location: [facility] CATH LAB;  Service: Cardiovascular;  Laterality: N/A;  . NASAL SEPTUM SURGERY     UP3  . PERCUTANEOUS  CORONARY STENT INTERVENTION (PCI-S)  10/2008   mRCA PCI  2.75 x 23 Abbott Xience V drug-eluting stent   . RIGHT/LEFT HEART CATH AND CORONARY/GRAFT ANGIOGRAPHY N/A 01/07/2020   Procedure: RIGHT/LEFT HEART CATH AND CORONARY/GRAFT ANGIOGRAPHY;  Surgeon: [name], MD;  Location: [place] CV LAB;  Service: Cardiovascular;  Laterality: N/A;  . ULTRASOUND GUIDANCE FOR VASCULAR  ACCESS  12/15/2017   Procedure: Ultrasound Guidance For Vascular Access;  Surgeon: [name], MD;  Location: [place] CV LAB;  Service: Cardiovascular;;    MEDICATIONS: . albuterol (VENTOLIN HFA) 108 (90 Base) MCG/ACT inhaler  . amLODipine (NORVASC) 5 MG tablet  . apixaban (ELIQUIS) 5 MG TABS tablet  . atorvastatin (LIPITOR) 80 MG tablet  . cetirizine (ZYRTEC) 10 MG tablet  . Continuous Blood Gluc Receiver (FREESTYLE LIBRE 14 DAY READER) DEVI  . Continuous Blood Gluc Sensor (FREESTYLE LIBRE 14 DAY SENSOR) MISC  . Diclofenac Sodium (PENNSAID) 2 % SOLN  . famotidine (PEPCID) 20 MG tablet  . fenofibrate (TRICOR) 145 MG tablet  . furosemide (LASIX) 20 MG tablet  . glucose blood (ACCU-CHEK AVIVA) test strip  . HYDROcodone-acetaminophen (NORCO/VICODIN) 5-325 MG tablet  . influenza vaccine adjuvanted (FLUAD) 0.5 ML injection  . insulin aspart (FIASP FLEXTOUCH) 100 UNIT/ML FlexTouch Pen  . insulin NPH Human (NOVOLIN N) 100 UNIT/ML injection  . Insulin Pen Needle 31G X 8 MM MISC  . Insulin Syringe-Needle U-100 31G X 5/16" 1 ML MISC  . Insulin Syringe-Needle U-100 31G X 5/16" 1 ML MISC  . isosorbide mononitrate (IMDUR) 120 MG 24 hr tablet  . levothyroxine (SYNTHROID) 100 MCG tablet  . metoprolol succinate (TOPROL-XL) 50 MG 24 hr tablet  . nitroGLYCERIN (NITROSTAT) 0.4 MG SL tablet  . pantoprazole (PROTONIX) 40 MG tablet  . pramipexole (MIRAPEX) 1 MG tablet  . ranolazine (RANEXA) 500 MG 12 hr tablet  . Semaglutide, 1 MG/DOSE, 4 MG/3ML SOPN  . sertraline (ZOLOFT) 100 MG tablet  . tiZANidine (ZANAFLEX) 4 MG  tablet  . triamcinolone cream (KENALOG) 0.1 %  . Vitamin D, Ergocalciferol, (DRISDOL) 1.25 MG (50000 UNIT) CAPS capsule   No current facility-administered medications for this encounter.    Willeen Cass, PhD, FNP-BC [facility]/Anesthesiology Phone: [phone] 03/26/2021 11:24 AM

## 2021-03-26 NOTE — Telephone Encounter (Signed)
Message sent thru MyChart 

## 2021-03-26 NOTE — Telephone Encounter (Signed)
Patient's daughter called asking if nurse could give her a call back re: patient is supposed to have knee surgery and they are telling him now that his A1C is too high to have the surgery done right now. Daughter asked if we could give patient a call back at [phone].

## 2021-03-26 NOTE — Progress Notes (Signed)
Lab results: A1-C: 8.9 Glucose UA: > 500 Glucose,Bld: 291 Cr: 2.23 PCR: Positive STAPH

## 2021-03-26 NOTE — Telephone Encounter (Signed)
We are working to get the A1c better.  When the A1c is better, you will be ready to go ahead with the surgery.

## 2021-03-27 ENCOUNTER — Other Ambulatory Visit (HOSPITAL_COMMUNITY): Payer: Medicare PPO

## 2021-03-27 DIAGNOSIS — D509 Iron deficiency anemia, unspecified: Secondary | ICD-10-CM | POA: Diagnosis not present

## 2021-03-27 DIAGNOSIS — N1832 Chronic kidney disease, stage 3b: Secondary | ICD-10-CM | POA: Diagnosis not present

## 2021-03-27 DIAGNOSIS — I129 Hypertensive chronic kidney disease with stage 1 through stage 4 chronic kidney disease, or unspecified chronic kidney disease: Secondary | ICD-10-CM | POA: Diagnosis not present

## 2021-03-27 DIAGNOSIS — E785 Hyperlipidemia, unspecified: Secondary | ICD-10-CM | POA: Diagnosis not present

## 2021-03-27 DIAGNOSIS — N184 Chronic kidney disease, stage 4 (severe): Secondary | ICD-10-CM | POA: Diagnosis not present

## 2021-03-27 DIAGNOSIS — E1122 Type 2 diabetes mellitus with diabetic chronic kidney disease: Secondary | ICD-10-CM | POA: Diagnosis not present

## 2021-03-27 DIAGNOSIS — I2510 Atherosclerotic heart disease of native coronary artery without angina pectoris: Secondary | ICD-10-CM | POA: Diagnosis not present

## 2021-03-31 ENCOUNTER — Other Ambulatory Visit (HOSPITAL_BASED_OUTPATIENT_CLINIC_OR_DEPARTMENT_OTHER): Payer: Self-pay

## 2021-03-31 ENCOUNTER — Ambulatory Visit (HOSPITAL_COMMUNITY): Admission: RE | Admit: 2021-03-31 | Payer: Medicare PPO | Source: Home / Self Care | Admitting: Orthopaedic Surgery

## 2021-03-31 ENCOUNTER — Encounter (HOSPITAL_COMMUNITY): Admission: RE | Payer: Self-pay | Source: Home / Self Care

## 2021-03-31 ENCOUNTER — Encounter: Payer: Self-pay | Admitting: Endocrinology

## 2021-03-31 LAB — TYPE AND SCREEN
ABO/RH(D): O POS
Antibody Screen: NEGATIVE

## 2021-03-31 SURGERY — ARTHROPLASTY, KNEE, TOTAL
Anesthesia: Spinal | Site: Knee | Laterality: Right

## 2021-04-01 ENCOUNTER — Other Ambulatory Visit (HOSPITAL_BASED_OUTPATIENT_CLINIC_OR_DEPARTMENT_OTHER): Payer: Self-pay

## 2021-04-01 MED FILL — Ergocalciferol Cap 1.25 MG (50000 Unit): ORAL | 28 days supply | Qty: 4 | Fill #0 | Status: AC

## 2021-04-03 ENCOUNTER — Other Ambulatory Visit (HOSPITAL_BASED_OUTPATIENT_CLINIC_OR_DEPARTMENT_OTHER): Payer: Self-pay

## 2021-04-03 ENCOUNTER — Ambulatory Visit (INDEPENDENT_AMBULATORY_CARE_PROVIDER_SITE_OTHER): Payer: Medicare PPO | Admitting: Endocrinology

## 2021-04-03 ENCOUNTER — Other Ambulatory Visit: Payer: Self-pay

## 2021-04-03 VITALS — BP 132/60 | HR 69 | Ht 69.0 in | Wt 207.0 lb

## 2021-04-03 DIAGNOSIS — E1165 Type 2 diabetes mellitus with hyperglycemia: Secondary | ICD-10-CM | POA: Diagnosis not present

## 2021-04-03 MED ORDER — INSULIN PEN NEEDLE 31G X 8 MM MISC
3 refills | Status: DC
  Filled 2021-04-03: qty 100, 50d supply, fill #0

## 2021-04-03 MED ORDER — TOUJEO MAX SOLOSTAR 300 UNIT/ML ~~LOC~~ SOPN
200.0000 [IU] | PEN_INJECTOR | SUBCUTANEOUS | 3 refills | Status: DC
  Filled 2021-04-03: qty 60, 90d supply, fill #0
  Filled 2021-04-06: qty 6, 9d supply, fill #0

## 2021-04-03 NOTE — Patient Instructions (Addendum)
check your blood sugar twice a day.  vary the time of day when you check, between before the 3 meals, and at bedtime.  also check if you have symptoms of your blood sugar being too high or too low.  please keep a record of the readings and bring it to your next appointment here (or you can bring the meter itself).  You can write it on any piece of paper.  please call us sooner if your blood sugar goes below 70, or if you have a lot of readings over 200.   Please stop taking both current insulins, and: Start Toujeo, 200 units each morning.  Please come back for a follow-up appointment in 2 months.

## 2021-04-03 NOTE — Progress Notes (Signed)
Subjective:    Patient ID: [name] [name], male    DOB: 08-14-1946, 75 y.o.   MRN: [id number]  HPI Pt returns for f/u of diabetes mellitus:  DM type: Insulin-requiring type 2.  Dx'ed: 9476 Complications: PN, stage 3b CRI, CAD and PAD.   Therapy: insulin since 2008.   DKA: never Severe hypoglycemia: never Pancreatitis: never Pancreatic imaging: 2017 CT showed fatty replacement of the pancreatic head and body.   SDOH: HS insulin is NPH, due to cost; he could not afford Trulicity.   Other: he takes multiple daily injections; he is not using continuous glucose monitor.   Interval history: no cbg record, but states cbg varies from 65-300.  He says it is lowest at [facility].  He took last dose of Fiasp at 11PM, when he ate a sandwich. pt states he feels well in general.  Pt says he never misses the insulin, but he sometimes does not take Fiasp until [facility].     Past Surgical History:  Procedure Laterality Date  . ANTERIOR CERVICAL DECOMP/DISCECTOMY FUSION N/A 05/31/2016   Procedure: ANTERIOR CERVICAL DECOMPRESSION/DISCECTOMY FUSION CERVICAL FIVE-SIX,CERVICAL SIX-SEVEN;  Surgeon: [name], MD;  Location: [place] NEURO ORS;  Service: Neurosurgery;  Laterality: N/A;  . CARDIAC CATHETERIZATION N/A 05/30/2015   Procedure: Left Heart Cath and Coronary Angiography;  Surgeon: [name], MD;  Location: [place] CV LAB;  Service: Cardiovascular;  Laterality: N/A;  . COLONOSCOPY    . CORONARY ARTERY BYPASS GRAFT     2000  . ESOPHAGOGASTRODUODENOSCOPY    . LEFT HEART CATH AND CORS/GRAFTS ANGIOGRAPHY N/A 12/15/2017   Procedure: LEFT HEART CATH AND CORS/GRAFTS ANGIOGRAPHY;  Surgeon: [name], MD;  Location: [place] CV LAB;  Service: Cardiovascular;  Laterality: N/A;  . LEFT HEART CATHETERIZATION WITH CORONARY/GRAFT ANGIOGRAM N/A 08/01/2012   Procedure: LEFT HEART CATHETERIZATION WITH Beatrix Fetters;  Surgeon: [name], MD;  Location: [facility] CATH LAB;  Service: Cardiovascular;   Laterality: N/A;  . LEFT HEART CATHETERIZATION WITH CORONARY/GRAFT ANGIOGRAM N/A 01/03/2015   Procedure: LEFT HEART CATHETERIZATION WITH Beatrix Fetters;  Surgeon: [name], MD;  Location: [facility] CATH LAB;  Service: Cardiovascular;  Laterality: N/A;  . NASAL SEPTUM SURGERY     UP3  . PERCUTANEOUS CORONARY STENT INTERVENTION (PCI-S)  10/2008   mRCA PCI  2.75 x 23 Abbott Xience V drug-eluting stent   . RIGHT/LEFT HEART CATH AND CORONARY/GRAFT ANGIOGRAPHY N/A 01/07/2020   Procedure: RIGHT/LEFT HEART CATH AND CORONARY/GRAFT ANGIOGRAPHY;  Surgeon: [name], MD;  Location: [place] CV LAB;  Service: Cardiovascular;  Laterality: N/A;  . ULTRASOUND GUIDANCE FOR VASCULAR ACCESS  12/15/2017   Procedure: Ultrasound Guidance For Vascular Access;  Surgeon: [name], MD;  Location: [place] CV LAB;  Service: Cardiovascular;;    Social History   Socioeconomic History  . Marital status: Married    Spouse name: Not on file  . Number of children: 4  . Years of education: 35  . Highest education level: Not on file  Occupational History  . Occupation: retired  Tobacco Use  . Smoking status: Never Smoker  . Smokeless tobacco: Never Used  Vaping Use  . Vaping Use: Never used  Substance and Sexual Activity  . Alcohol use: No    Alcohol/week: 0.0 standard drinks    Comment: stopped drinking in 1998  . Drug use: No  . Sexual activity: Not on file  Other Topics Concern  . Not on file  Social History Narrative  Patient is right handed.   Patient does not drink caffeine.   Social Determinants of Health   Financial Resource Strain: Not on file  Food Insecurity: Not on file  Transportation Needs: Not on file  Physical Activity: Not on file  Stress: Not on file  Social Connections: Not on file  Intimate Partner Violence: Not on file    Current Outpatient Medications on File Prior to Visit  Medication Sig Dispense Refill  . albuterol (VENTOLIN HFA) 108 (90 Base)  MCG/ACT inhaler Inhale 2 puffs into the lungs every 6 (six) hours as needed for wheezing or shortness of breath. 18 g 2  . amLODipine (NORVASC) 5 MG tablet Take 1 tablet (5 mg total) by mouth at bedtime. 90 tablet 2  . apixaban (ELIQUIS) 5 MG TABS tablet TAKE 1 TABLET BY MOUTH TWICE DAILY (Patient taking differently: Take 5 mg by mouth 2 (two) times daily.) 60 tablet 5  . atorvastatin (LIPITOR) 80 MG tablet Take 1 tablet (80 mg total) by mouth daily. (Patient taking differently: Take 80 mg by mouth at bedtime.) 90 tablet 3  . cetirizine (ZYRTEC) 10 MG tablet TAKE 1 TABLET (10 MG TOTAL) BY MOUTH DAILY. (Patient taking differently: Take 10 mg by mouth daily.) 100 tablet 2  . Continuous Blood Gluc Receiver (FREESTYLE LIBRE 14 DAY READER) DEVI For continuous Blood Glucose monitoring 1 each 0  . Continuous Blood Gluc Sensor (FREESTYLE LIBRE 14 DAY SENSOR) MISC USE TO CHECK BLOOD SUGAR AND CHANGE EVERY 14 DAYS 6 each 3  . Diclofenac Sodium (PENNSAID) 2 % SOLN Place 1 application onto the skin 2 (two) times daily. 112 g 2  . famotidine (PEPCID) 20 MG tablet TAKE 1 TABLET BY MOUTH 2 TIMES DAILY (Patient taking differently: Take 40 mg by mouth daily.) 60 tablet 2  . fenofibrate (TRICOR) 145 MG tablet TAKE 1 TABLET (145 MG TOTAL) BY MOUTH DAILY. (Patient taking differently: Take 145 mg by mouth daily.) 90 tablet 1  . furosemide (LASIX) 20 MG tablet TAKE 1 TABLET BY MOUTH DAILY AS NEEDED FOR EDEMA (Patient taking differently: Take 20 mg by mouth daily as needed for edema.) 30 tablet 3  . glucose blood (ACCU-CHEK AVIVA) test strip Use as directed to check blood sugar 4 times daily.  DX E11.9 200 each 6  . HYDROcodone-acetaminophen (NORCO/VICODIN) 5-325 MG tablet TAKE 1 TABLET BY MOUTH EVERY 8 (EIGHT) HOURS AS NEEDED. (Patient taking differently: Take 1 tablet by mouth every 6 (six) hours as needed for severe pain.) 15 tablet 0  . influenza vaccine adjuvanted (FLUAD) 0.5 ML injection TAKE AS DIRECTED .5 mL 0  .  Insulin Pen Needle 31G X 8 MM MISC USE TO CHECK BLOOD SUGAR THREE TIME DAILY 100 each 5  . Insulin Syringe-Needle U-100 31G X 5/16" 1 ML MISC USE AS DIRECTED WITH NOVOLIN 100 each 0  . Insulin Syringe-Needle U-100 31G X 5/16" 1 ML MISC USE AS DIRECTED WITH NOVOLIN 100 each 0  . isosorbide mononitrate (IMDUR) 120 MG 24 hr tablet TAKE 1 TABLET (120 MG TOTAL) BY MOUTH DAILY. (Patient taking differently: Take 120 mg by mouth daily.) 90 tablet 3  . levothyroxine (SYNTHROID) 100 MCG tablet TAKE 1 TABLET (100 MCG TOTAL) BY MOUTH DAILY. (Patient taking differently: Take 100 mcg by mouth daily.) 90 tablet 1  . metoprolol succinate (TOPROL-XL) 50 MG 24 hr tablet TAKE 1 AND 1/2 TABLETS EVERY DAY FOR A TOTAL OF 75 MG (Patient taking differently: Take 75 mg by mouth daily.) 135 tablet  3  . nitroGLYCERIN (NITROSTAT) 0.4 MG SL tablet PLACE 1 TABLET UNDER THE TONGUE EVERY 5 MINUTES AS NEEDED FOR CHEST PAIN. (Patient taking differently: Place 0.4 mg under the tongue every 5 (five) minutes as needed for chest pain.) 25 tablet 1  . pantoprazole (PROTONIX) 40 MG tablet Take 1 tablet (40 mg total) by mouth daily. 90 tablet 3  . pramipexole (MIRAPEX) 1 MG tablet TAKE 1/2 TO 1 TABLET AT BEDTIME (Patient taking differently: Take 0.5-1 mg by mouth at bedtime.) 90 tablet 0  . ranolazine (RANEXA) 500 MG 12 hr tablet TAKE 1 TABLET BY MOUTH TWICE DAILY (Patient taking differently: Take 500 mg by mouth 2 (two) times daily.) 60 tablet 5  . sertraline (ZOLOFT) 100 MG tablet TAKE 1 TABLET BY MOUTH TWICE DAILY (Patient taking differently: Take 100 mg by mouth 2 (two) times daily.) 180 tablet 1  . tiZANidine (ZANAFLEX) 4 MG tablet TAKE 1 TABLET BY MOUTH EVERY 6 HOURS AS NEEDED FOR MUSCLE SPASMS (Patient taking differently: Take 4 mg by mouth every 6 (six) hours as needed for muscle spasms.) 40 tablet 2  . triamcinolone cream (KENALOG) 0.1 % Apply 1 application topically 2 (two) times daily. Use as needed for itchy rash 30 g 1  . Vitamin  D, Ergocalciferol, (DRISDOL) 1.25 MG (50000 UNIT) CAPS capsule TAKE 1 CAPSULE BY MOUTH EVERY 7 DAYS (Patient taking differently: Take 50,000 Units by mouth every 7 (seven) days.) 4 capsule 4   No current facility-administered medications on file prior to visit.    Allergies  Allergen Reactions  . Morphine Other (See Comments)    hallucinations  . Shellfish-Derived Products Itching    Family History  Problem Relation Age of Onset  . Pancreatic cancer Brother   . Diabetes Brother   . Coronary artery disease Brother   . Stroke Brother   . Diabetes Brother   . Diabetes Mother   . Heart failure Mother   . Heart failure Father   . Hypothyroidism Brother   . Coronary artery disease Brother   . Other Brother        colon surgery  . Heart attack Other        Nephew  . Irregular heart beat Daughter   . Cancer Maternal Grandmother        unknown   . Colon cancer Neg Hx   . Stomach cancer Neg Hx   . Liver cancer Neg Hx   . Rectal cancer Neg Hx   . Esophageal cancer Neg Hx     BP 132/60 (BP Location: Right Arm, Patient Position: Sitting, Cuff Size: Large)   Pulse 69   Ht 5\' 9"  (1.753 m)   Wt 207 lb (93.9 kg)   SpO2 98%   BMI 30.57 kg/m    Review of Systems     Objective:   Physical Exam VITAL SIGNS:  See vs page GENERAL: no distress Pulses: dorsalis pedis intact bilat.   MSK: no deformity of the feet.  CV: no leg edema.  Skin:  no ulcer on the feet.  normal color and temp on the feet.   Neuro: sensation is intact to touch on the feet.     Lab Results  Component Value Date   HGBA1C 8.9 (H) 03/25/2021       Assessment & Plan:  Insulin-requiring type 2 DM.  Uncontrolled. He has failed multiple daily injections.    Patient Instructions  check your blood sugar twice a day.  vary the time of  day when you check, between before the 3 meals, and at bedtime.  also check if you have symptoms of your blood sugar being too high or too low.  please keep a record of the  readings and bring it to your next appointment here (or you can bring the meter itself).  You can write it on any piece of paper.  please call us sooner if your blood sugar goes below 70, or if you have a lot of readings over 200.   Please stop taking both current insulins, and: Start Toujeo, 200 units each morning.  Please come back for a follow-up appointment in 2 months.

## 2021-04-06 ENCOUNTER — Other Ambulatory Visit (HOSPITAL_BASED_OUTPATIENT_CLINIC_OR_DEPARTMENT_OTHER): Payer: Self-pay

## 2021-04-06 MED FILL — Ranolazine Tab ER 12HR 500 MG: ORAL | 30 days supply | Qty: 60 | Fill #1 | Status: AC

## 2021-04-06 MED FILL — Furosemide Tab 20 MG: ORAL | 30 days supply | Qty: 30 | Fill #0 | Status: AC

## 2021-04-06 MED FILL — Apixaban Tab 5 MG: ORAL | 30 days supply | Qty: 60 | Fill #0 | Status: AC

## 2021-04-06 MED FILL — Famotidine Tab 20 MG: ORAL | 30 days supply | Qty: 60 | Fill #0 | Status: AC

## 2021-04-07 ENCOUNTER — Other Ambulatory Visit (HOSPITAL_BASED_OUTPATIENT_CLINIC_OR_DEPARTMENT_OTHER): Payer: Self-pay

## 2021-04-08 ENCOUNTER — Other Ambulatory Visit (HOSPITAL_BASED_OUTPATIENT_CLINIC_OR_DEPARTMENT_OTHER): Payer: Self-pay

## 2021-04-09 ENCOUNTER — Other Ambulatory Visit (HOSPITAL_BASED_OUTPATIENT_CLINIC_OR_DEPARTMENT_OTHER): Payer: Self-pay

## 2021-04-10 ENCOUNTER — Other Ambulatory Visit (HOSPITAL_COMMUNITY): Payer: Self-pay

## 2021-04-14 ENCOUNTER — Emergency Department (HOSPITAL_COMMUNITY)
Admission: EM | Admit: 2021-04-14 | Discharge: 2021-04-14 | Disposition: A | Discharge status: Home / Self Care | Payer: Medicare PPO | Attending: Emergency Medicine | Admitting: Emergency Medicine

## 2021-04-14 ENCOUNTER — Other Ambulatory Visit: Payer: Self-pay

## 2021-04-14 ENCOUNTER — Encounter (HOSPITAL_COMMUNITY): Payer: Self-pay

## 2021-04-14 DIAGNOSIS — R404 Transient alteration of awareness: Secondary | ICD-10-CM | POA: Diagnosis not present

## 2021-04-14 DIAGNOSIS — E1122 Type 2 diabetes mellitus with diabetic chronic kidney disease: Secondary | ICD-10-CM | POA: Insufficient documentation

## 2021-04-14 DIAGNOSIS — R519 Headache, unspecified: Secondary | ICD-10-CM | POA: Diagnosis not present

## 2021-04-14 DIAGNOSIS — E039 Hypothyroidism, unspecified: Secondary | ICD-10-CM | POA: Diagnosis not present

## 2021-04-14 DIAGNOSIS — Z79899 Other long term (current) drug therapy: Secondary | ICD-10-CM | POA: Insufficient documentation

## 2021-04-14 DIAGNOSIS — E161 Other hypoglycemia: Secondary | ICD-10-CM | POA: Diagnosis not present

## 2021-04-14 DIAGNOSIS — E11649 Type 2 diabetes mellitus with hypoglycemia without coma: Secondary | ICD-10-CM | POA: Insufficient documentation

## 2021-04-14 DIAGNOSIS — I4510 Unspecified right bundle-branch block: Secondary | ICD-10-CM | POA: Diagnosis not present

## 2021-04-14 DIAGNOSIS — Z951 Presence of aortocoronary bypass graft: Secondary | ICD-10-CM | POA: Diagnosis not present

## 2021-04-14 DIAGNOSIS — I480 Paroxysmal atrial fibrillation: Secondary | ICD-10-CM | POA: Insufficient documentation

## 2021-04-14 DIAGNOSIS — Z7901 Long term (current) use of anticoagulants: Secondary | ICD-10-CM | POA: Insufficient documentation

## 2021-04-14 DIAGNOSIS — N184 Chronic kidney disease, stage 4 (severe): Secondary | ICD-10-CM | POA: Diagnosis not present

## 2021-04-14 DIAGNOSIS — E162 Hypoglycemia, unspecified: Secondary | ICD-10-CM

## 2021-04-14 DIAGNOSIS — Z794 Long term (current) use of insulin: Secondary | ICD-10-CM | POA: Diagnosis not present

## 2021-04-14 DIAGNOSIS — I130 Hypertensive heart and chronic kidney disease with heart failure and stage 1 through stage 4 chronic kidney disease, or unspecified chronic kidney disease: Secondary | ICD-10-CM | POA: Diagnosis not present

## 2021-04-14 DIAGNOSIS — I2510 Atherosclerotic heart disease of native coronary artery without angina pectoris: Secondary | ICD-10-CM | POA: Insufficient documentation

## 2021-04-14 DIAGNOSIS — I5032 Chronic diastolic (congestive) heart failure: Secondary | ICD-10-CM | POA: Diagnosis not present

## 2021-04-14 DIAGNOSIS — R0902 Hypoxemia: Secondary | ICD-10-CM | POA: Diagnosis not present

## 2021-04-14 LAB — CBG MONITORING, ED
Glucose-Capillary: 53 mg/dL — ABNORMAL LOW (ref 70–99)
Glucose-Capillary: 60 mg/dL — ABNORMAL LOW (ref 70–99)
Glucose-Capillary: 207 mg/dL — ABNORMAL HIGH (ref 70–99)
Glucose-Capillary: 273 mg/dL — ABNORMAL HIGH (ref 70–99)

## 2021-04-14 LAB — COMPREHENSIVE METABOLIC PANEL
ALT: 16 U/L (ref 0–44)
AST: 22 U/L (ref 15–41)
Albumin: 4.2 g/dL (ref 3.5–5.0)
Alkaline Phosphatase: 42 U/L (ref 38–126)
Anion gap: 8 (ref 5–15)
BUN: 24 mg/dL — ABNORMAL HIGH (ref 8–23)
CO2: 25 mmol/L (ref 22–32)
Calcium: 9.4 mg/dL (ref 8.9–10.3)
Chloride: 105 mmol/L (ref 98–111)
Creatinine, Ser: 1.79 mg/dL — ABNORMAL HIGH (ref 0.61–1.24)
GFR, Estimated: 40 mL/min — ABNORMAL LOW (ref >60)
Glucose, Bld: 58 mg/dL — ABNORMAL LOW (ref 70–99)
Potassium: 3.9 mmol/L (ref 3.5–5.1)
Sodium: 138 mmol/L (ref 135–145)
Total Bilirubin: 1 mg/dL (ref 0.3–1.2)
Total Protein: 7.3 g/dL (ref 6.5–8.1)

## 2021-04-14 LAB — CBC WITH DIFFERENTIAL/PLATELET
Abs Immature Granulocytes: 0.03 10*3/uL (ref 0.00–0.07)
Basophils Absolute: 0 10*3/uL (ref 0.0–0.1)
Basophils Relative: 0 %
Eosinophils Absolute: 0.1 10*3/uL (ref 0.0–0.5)
Eosinophils Relative: 1 %
HCT: 41.3 % (ref 39.0–52.0)
Hemoglobin: 13.9 g/dL (ref 13.0–17.0)
Immature Granulocytes: 0 %
Lymphocytes Relative: 18 %
Lymphs Abs: 1.4 10*3/uL (ref 0.7–4.0)
MCH: 30.5 pg (ref 26.0–34.0)
MCHC: 33.7 g/dL (ref 30.0–36.0)
MCV: 90.6 fL (ref 80.0–100.0)
Monocytes Absolute: 0.5 10*3/uL (ref 0.1–1.0)
Monocytes Relative: 6 %
Neutro Abs: 5.9 10*3/uL (ref 1.7–7.7)
Neutrophils Relative %: 75 %
Platelets: 243 10*3/uL (ref 150–400)
RBC: 4.56 MIL/uL (ref 4.22–5.81)
RDW: 13.7 % (ref 11.5–15.5)
WBC: 7.9 10*3/uL (ref 4.0–10.5)
nRBC: 0 % (ref 0.0–0.2)

## 2021-04-14 NOTE — ED Provider Notes (Signed)
Emergency Medicine Provider Triage Evaluation Note  [name] [name] , a 75 y.o. male  was evaluated in triage.  Pt complains of loss of consciousness.  Found by wife this morning only responding to painful stimuli. Found in bed. CBG 49. Patient states diabetes meds recently changed. Just taking Tujeo now, takes it every morning. Last dose yesterday morning.  Now has a headache but feels fine. He is eating.   Review of Systems  Positive: LOC, headache Negative: Fevers, cough, vomiting, diarrhea  Physical Exam  BP (!) 147/80 (BP Location: Right Arm)   Pulse 61   Temp 97.6 F (36.4 C) (Oral)   Resp 18   SpO2 99%  Gen:   Awake, no distress  Resp:  Normal effort  MSK:   Moves extremities without difficulty  Other:  eating  Medical Decision Making  Medically screening exam initiated at 1:57 PM.  Appropriate orders placed.  Cinda Quest Waldeck was informed that the remainder of the evaluation will be completed by another provider, this initial triage assessment does not replace that evaluation, and the importance of remaining in the ED until their evaluation is complete.   Patient made aware this encounter is a triage and screening encounter and no beds are immediately available at this time in the ER.  Patient was informed that the remainder of the evaluation will be completed by another provider.  Patient made aware triage orders have been placed and patient will be placed in the waiting room while work up is initiated and until a room becomes available. Patient encouraged to await a formal ER encounter with a clinician.  Patient made aware that exiting the department prior to formal encounter with an ER clinician and completion of the work-up is considered leaving against medical advice.  At that time there is no guarantee that there are no emergency medical conditions present and patient assumes risks of leaving including worsening condition, permanent disability and death. Patient verbalizes  understanding.     Kinnie Feil, PA-C 04/14/21 1359    Lennice Sites, DO 04/14/21 [phone]

## 2021-04-14 NOTE — ED Notes (Signed)
Pt ambulatory to [facility]

## 2021-04-14 NOTE — ED Triage Notes (Signed)
Pt BIB from home by [facility]. Spouse called 911 d/t pt only responsive to painful stimuli. Pt last ate 10 pm last, usually wakes around 11am. Pt's CBG 49, gave 150cc  D10, CBG increased to 110, CBG 60 upon arrival to ED. Recently changed his novolog to TJO.    20G RH  BP 140/90 HR 80 100% RA

## 2021-04-14 NOTE — Discharge Instructions (Addendum)
It was wonderful to see you today.  Please cut back your Toujeo to 150 units in the morning.  Please check your sugar fasting before you eat and then 2 hours after your largest meal.  If you have any recurrent episodes similar today, chest pain, shortness of breath, persistent lightheadedness/dizziness with difficulty walking, please return to the ED.

## 2021-04-14 NOTE — ED Provider Notes (Signed)
[facility] EMERGENCY DEPARTMENT Provider Note   CSN: [id number] Arrival date & time: 04/14/21  1346     History Chief Complaint  Patient presents with  . Hypoglycemia    [name] [name] is a 75 y.o. male with a history of insulin-dependent type 2 diabetes and CKD, presenting via EMS due to hypoglycemia.  He was found by his wife this morning still in bed only responding to painful stimuli.  CBG at that time was 49.  Woke up prior to EMS arrival, states he felt slightly confused.  EMS was called and gave D10 with improvement of glucose into the 100s.  Upon arrival, was given orange juice and a sandwich.  Recheck upon arrival to ED with CBG of 60, then 53.   During our conversation, he is alert and oriented.  He states prior to this episode this morning he felt at his usual state of health.  He recently switched to Toujeo 200 units in the morning on 4/30.  He previously was taking 3 times daily NovoLog/fiasp injections (not taking consistently sometimes).  He denies any chest pain, shortness of breath, abdominal pain, palpitations, lightheadedness/dizziness.  Reports he has a small frontal throbbing headache, but otherwise feeling okay.  No associated falls with this episode.  Last A1c 8.9%.     Past Medical History:  Diagnosis Date  . Acquired atrophy of thyroid 06/05/2013   Overview:  July 2014: controlled on synthroid 20mcg since 2012 May 2015: decreased to Synthroid 50 mcg  Last Assessment & Plan:  Pt doing well with most recent TSH within normal range.  Will continue current dosage of Synthroid 60mcg. Pt reminded to take medication on empty stomach. Will continue to monitor with periodic laboratory assessment in 3 months.   . Anemia    hemoglobin 7.4, iron deficiency, January, 2011, 2 unit transfusion, endoscopy normal, capsule endoscopy February, 2011 no small bowel abnormalities.   Most likely source gastric erosions, followed by GI  . Anxiety   . Bradycardia   .  CAD (coronary artery disease)    A. CABG in 2000,status post cardiac cath in 2006, 2009 ....continued chest pain and SOB despite oral medication adjestments including Ranexa. B. Cath November 2009/ mRCA - 2.75 x 23 Abbott Xience V drug-eluting stent ...11/26/2008 to distal  RCA leading to acute marginal.  C. Cath 07/2012 for CP - stable anatomy, med rx. d. cath 2015 and 05/30/2015 stable anatomy, consider Myoview if has CP again  . Carotid artery disease ([place]) 08/01/2015   Doppler, May 29, 2015, 1-39% bilateral ICA   . Cerebral ischemia    MRI November, 2010, chronic microvascular ischemia  . CKD (chronic kidney disease), stage III ([place])   . Concussion   . Depression    Bipolar  . Diabetes mellitus ([place])   . Edema   . Essential hypertension 06/05/2013   Overview:  July 2014: Controlled with Bystolic Sep 2376: Changed to atenolol.  Last Assessment & Plan:  Will change to atenolol for cost and follow Improved.  Medication compliance strongly encouraged BP: 116/64 mmHg    Overview:  July 2014: Controlled with Bystolic Sep 2831: Changed to atenolol.  Last Assessment & Plan:  Will change to atenolol for cost and follow  . Falling episodes    these have occurred in the past and again recurring 2011  . Family history of adverse reaction to anesthesia    "mother died during bypass surgery but not sure if it has to do with anesthesia"  .  Gastric ulcer   . GERD (gastroesophageal reflux disease)   . H/O medication noncompliance    Due to loss of insurance  . H/O multiple concussions   . Hard of hearing   . History of blood transfusion 12/20/2013  . Hx of CABG    2000,  / one median sternotomy suture broken her chest x-ray November, 2010, no clinical significance  . Hyperlipidemia   . Hypertension    pt. denies  . Iron deficiency anemia   . Long term (current) use of anticoagulants [Z79.01] 07/20/2016  . Low back pain 06/12/2009   Qualifier: Diagnosis of  By: [name]   . Nephrolithiasis   .  Orthostasis   . OSA (obstructive sleep apnea)   . PAF (paroxysmal atrial fibrillation) ([place])    a. dx 2017, started on amiodarone/warfarin but patient intermittently noncompliant.  Marland Kitchen PSVT (paroxysmal supraventricular tachycardia) ([place])   . RBBB 07/09/2009   Qualifier: Diagnosis of  By: [name], MD, [name] [name]   . RLS (restless legs syndrome) 09/19/2009   Qualifier: Diagnosis of  By: [name]   Overview:  July 2014: Controlled with Mirapex  Last Assessment & Plan:  Patient is doing well. Will continue current management and follow clinically.  . Seizure disorder ([place]) 01/09/2017  . Spondylosis    C5-6, C6-7 MRI 2010  . Syncope 03/2016  . Thyroid disease   . Tubulovillous adenoma of colon 2007  . Vitamin D deficiency 05/17/2017  . Wears glasses     Patient Active Problem List   Diagnosis Date Noted  . Sprain of anterior talofibular ligament of left ankle 01/08/2021  . Achilles tendon pain 06/06/2020  . Thoracic back pain 05/11/2020  . CKD (chronic kidney disease), stage IV ([place])   . Left ankle pain 12/30/2019  . Frequency of urination 05/13/2019  . Melena 04/09/2019  . Obesity 02/05/2019  . Neck pain 02/05/2019  . MCI (mild cognitive impairment) 02/05/2019  . Fall at home, initial encounter 11/06/2018  . Localized swelling of both lower extremities 07/13/2018  . Cervical radiculopathy at C8 02/08/2018  . Hypersomnolence 10/18/2017  . Vitamin D deficiency 05/17/2017  . Seizure disorder ([place]) 01/09/2017  . Atrial fibrillation ([place]) [I48.91] 07/20/2016  . Long term (current) use of anticoagulants [Z79.01] 07/20/2016  . Foraminal stenosis of cervical region 05/31/2016  . DOE (dyspnea on exertion) 05/14/2016  . Chronic diastolic CHF (congestive heart failure) ([place]) 03/13/2016  . Headache 01/25/2016  . Carotid artery disease ([place]) 08/01/2015  . Spinal stenosis in cervical region 07/30/2015  . Occipital neuralgia of right side 07/15/2015  . Cervicogenic headache  07/15/2015  . Dizziness   . Chest pain 05/29/2015  . Medicare annual wellness visit, initial 05/11/2015  . Sun-damaged skin 05/11/2015  . Advance care planning 05/01/2015  . Post-traumatic headache 02/18/2015  . OA (osteoarthritis) of knee 01/07/2015  . Recurrent pain of right knee 05/10/2014  . Personal history of ongoing treatment with alendronate (Fosamax) 12/20/2013  . History of blood transfusion 12/20/2013  . Hyperlipidemia LDL goal <70 06/27/2013  . HLD (hyperlipidemia) 06/27/2013  . Essential hypertension 06/05/2013  . Arthritis of hand, degenerative 06/05/2013  . Acquired atrophy of thyroid 06/05/2013  . Hypothyroidism due to acquired atrophy of thyroid 06/05/2013  . Osteoarthritis of hand 06/05/2013  . Arteriosclerosis of coronary artery 05/01/2013  . Benign prostatic hyperplasia with urinary obstruction 05/01/2013  . BPH with obstruction/lower urinary tract symptoms 05/01/2013  . CAD (coronary artery disease) 05/01/2013  . Abdominal  pain, unspecified site 03/06/2013  . Right shoulder pain 10/24/2012  . Eczema 10/24/2012  . Ejection fraction   . Diabetes mellitus type II, uncontrolled ([place]) 08/31/2011  . Coronary artery disease involving native coronary artery   . Anemia   . OSA (obstructive sleep apnea)   . Edema   . Falling episodes   . Hx of CABG   . Palpitations   . Shortness of breath   . Insomnia 04/26/2011  . RLS (restless legs syndrome) 09/19/2009  . Depression 07/09/2009  . RBBB 07/09/2009  . Low back pain potentially associated with radiculopathy 06/12/2009  . SCIATICA 01/17/2009  . GERD (gastroesophageal reflux disease) 01/09/2009  . TUBULOVILLOUS ADENOMA, COLON, HX OF 01/08/2009  . Presence of drug coated stent in right coronary artery 10/29/2008  . NEPHROLITHIASIS, HX OF 12/20/2007    Past Surgical History:  Procedure Laterality Date  . ANTERIOR CERVICAL DECOMP/DISCECTOMY FUSION N/A 05/31/2016   Procedure: ANTERIOR CERVICAL  DECOMPRESSION/DISCECTOMY FUSION CERVICAL FIVE-SIX,CERVICAL SIX-SEVEN;  Surgeon: [name], MD;  Location: [facility] NEURO ORS;  Service: Neurosurgery;  Laterality: N/A;  . CARDIAC CATHETERIZATION N/A 05/30/2015   Procedure: Left Heart Cath and Coronary Angiography;  Surgeon: [name], MD;  Location: [facility] CV LAB;  Service: Cardiovascular;  Laterality: N/A;  . COLONOSCOPY    . CORONARY ARTERY BYPASS GRAFT     2000  . ESOPHAGOGASTRODUODENOSCOPY    . LEFT HEART CATH AND CORS/GRAFTS ANGIOGRAPHY N/A 12/15/2017   Procedure: LEFT HEART CATH AND CORS/GRAFTS ANGIOGRAPHY;  Surgeon: [name], MD;  Location: [facility] CV LAB;  Service: Cardiovascular;  Laterality: N/A;  . LEFT HEART CATHETERIZATION WITH CORONARY/GRAFT ANGIOGRAM N/A 08/01/2012   Procedure: LEFT HEART CATHETERIZATION WITH Beatrix Fetters;  Surgeon: [name], MD;  Location[facility] CATH LAB;  Service: Cardiovascular;  Laterality: N/A;  . LEFT HEART CATHETERIZATION WITH CORONARY/GRAFT ANGIOGRAM N/A 01/03/2015   Procedure: LEFT HEART CATHETERIZATION WITH Beatrix Fetters;  Surgeon: [name], MD;  Location: [facility] CATH LAB;  Service: Cardiovascular;  Laterality: N/A;  . NASAL SEPTUM SURGERY     UP3  . PERCUTANEOUS CORONARY STENT INTERVENTION (PCI-S)  10/2008   mRCA PCI  2.75 x 23 Abbott Xience V drug-eluting stent   . RIGHT/LEFT HEART CATH AND CORONARY/GRAFT ANGIOGRAPHY N/A 01/07/2020   Procedure: RIGHT/LEFT HEART CATH AND CORONARY/GRAFT ANGIOGRAPHY;  Surgeon: [name], MD;  Location: [facility] CV LAB;  Service: Cardiovascular;  Laterality: N/A;  . ULTRASOUND GUIDANCE FOR VASCULAR ACCESS  12/15/2017   Procedure: Ultrasound Guidance For Vascular Access;  Surgeon: [name], MD;  Location: [facility] CV LAB;  Service: Cardiovascular;;       Family History  Problem Relation Age of Onset  . Pancreatic cancer Brother   . Diabetes Brother   . Coronary artery disease Brother   . Stroke  Brother   . Diabetes Brother   . Diabetes Mother   . Heart failure Mother   . Heart failure Father   . Hypothyroidism Brother   . Coronary artery disease Brother   . Other Brother        colon surgery  . Heart attack Other        Nephew  . Irregular heart beat Daughter   . Cancer Maternal Grandmother        unknown   . Colon cancer Neg Hx   . Stomach cancer Neg Hx   . Liver cancer Neg Hx   . Rectal cancer Neg Hx   . Esophageal  cancer Neg Hx     Social History   Tobacco Use  . Smoking status: Never Smoker  . Smokeless tobacco: Never Used  Vaping Use  . Vaping Use: Never used  Substance Use Topics  . Alcohol use: No    Alcohol/week: 0.0 standard drinks    Comment: stopped drinking in 1998  . Drug use: No    Home Medications Prior to Admission medications   Medication Sig Start Date End Date Taking? Authorizing Provider  albuterol (VENTOLIN HFA) 108 (90 Base) MCG/ACT inhaler Inhale 2 puffs into the lungs every 6 (six) hours as needed for wheezing or shortness of breath. 06/25/19   [name], MD  amLODipine (NORVASC) 5 MG tablet Take 1 tablet (5 mg total) by mouth at bedtime. 11/20/20   [name], MD  apixaban (ELIQUIS) 5 MG TABS tablet TAKE 1 TABLET BY MOUTH TWICE DAILY Patient taking differently: Take 5 mg by mouth 2 (two) times daily. 11/27/20 11/27/21  [name], MD  atorvastatin (LIPITOR) 80 MG tablet Take 1 tablet (80 mg total) by mouth daily. Patient taking differently: Take 80 mg by mouth at bedtime. 03/09/21   [name], MD  cetirizine (ZYRTEC) 10 MG tablet TAKE 1 TABLET (10 MG TOTAL) BY MOUTH DAILY. Patient taking differently: Take 10 mg by mouth daily. 03/03/21 03/03/22  [name], MD  Continuous Blood Gluc Receiver (FREESTYLE LIBRE 14 DAY READER) DEVI For continuous Blood Glucose monitoring 06/23/20   Renato Shin, MD  Continuous Blood Gluc Sensor (FREESTYLE LIBRE 14 DAY SENSOR) MISC USE TO CHECK BLOOD SUGAR AND CHANGE EVERY 14 DAYS  02/25/21 02/25/22  Renato Shin, MD  Diclofenac Sodium (PENNSAID) 2 % SOLN Place 1 application onto the skin 2 (two) times daily. 10/17/20   Rosemarie Ax, MD  famotidine (PEPCID) 20 MG tablet TAKE 1 TABLET BY MOUTH 2 TIMES DAILY Patient taking differently: Take 40 mg by mouth daily. 03/03/21 03/03/22  [name], MD  fenofibrate (TRICOR) 145 MG tablet TAKE 1 TABLET (145 MG TOTAL) BY MOUTH DAILY. Patient taking differently: Take 145 mg by mouth daily. 11/27/20 11/27/21  [name], MD  furosemide (LASIX) 20 MG tablet TAKE 1 TABLET BY MOUTH DAILY AS NEEDED FOR EDEMA Patient taking differently: Take 20 mg by mouth daily as needed for edema. 01/28/21 01/28/22  [name], MD  glucose blood (ACCU-CHEK AVIVA) test strip Use as directed to check blood sugar 4 times daily.  DX E11.9 02/05/19   [name], MD  HYDROcodone-acetaminophen (NORCO/VICODIN) 5-325 MG tablet TAKE 1 TABLET BY MOUTH EVERY 8 (EIGHT) HOURS AS NEEDED. Patient taking differently: Take 1 tablet by mouth every 6 (six) hours as needed for severe pain. 02/25/21 08/24/21  Rosemarie Ax, MD  influenza vaccine adjuvanted (FLUAD) 0.5 ML injection TAKE AS DIRECTED 09/12/20 09/12/21  Carlyle Basques, MD  insulin glargine, 2 Unit Dial, (TOUJEO MAX SOLOSTAR) 300 UNIT/ML Solostar Pen Inject 200 Units into the skin every morning. 04/03/21   Renato Shin, MD  Insulin Pen Needle 31G X 8 MM MISC USE TO CHECK BLOOD SUGAR THREE TIME DAILY 11/27/20 11/27/21  Renato Shin, MD  Insulin Pen Needle 31G X 8 MM MISC use to inject insulin twice a day 04/03/21   Renato Shin, MD  Insulin Syringe-Needle U-100 31G X 5/16" 1 ML MISC USE AS DIRECTED WITH NOVOLIN 02/24/21 02/24/22  Renato Shin, MD  Insulin Syringe-Needle U-100 31G X 5/16" 1 ML MISC USE AS DIRECTED WITH NOVOLIN  02/24/21 02/24/22  Renato Shin, MD  isosorbide mononitrate (IMDUR) 120 MG 24 hr tablet TAKE 1 TABLET (120 MG TOTAL) BY MOUTH DAILY. Patient taking differently: Take 120 mg by  mouth daily. 10/23/20 10/23/21  [name], MD  levothyroxine (SYNTHROID) 100 MCG tablet TAKE 1 TABLET (100 MCG TOTAL) BY MOUTH DAILY. Patient taking differently: Take 100 mcg by mouth daily. 01/13/21 01/13/22  [name], MD  metoprolol succinate (TOPROL-XL) 50 MG 24 hr tablet TAKE 1 AND 1/2 TABLETS EVERY DAY FOR A TOTAL OF 75 MG Patient taking differently: Take 75 mg by mouth daily. 11/20/20   [name], MD  nitroGLYCERIN (NITROSTAT) 0.4 MG SL tablet PLACE 1 TABLET UNDER THE TONGUE EVERY 5 MINUTES AS NEEDED FOR CHEST PAIN. Patient taking differently: Place 0.4 mg under the tongue every 5 (five) minutes as needed for chest pain. 05/20/20   [name], MD  pantoprazole (PROTONIX) 40 MG tablet Take 1 tablet (40 mg total) by mouth daily. 06/10/20   [name], MD  pramipexole (MIRAPEX) 1 MG tablet TAKE 1/2 TO 1 TABLET AT BEDTIME Patient taking differently: Take 0.5-1 mg by mouth at bedtime. 02/13/21   [name], MD  ranolazine (RANEXA) 500 MG 12 hr tablet TAKE 1 TABLET BY MOUTH TWICE DAILY Patient taking differently: Take 500 mg by mouth 2 (two) times daily. 03/02/21 03/02/22  [name], MD  sertraline (ZOLOFT) 100 MG tablet TAKE 1 TABLET BY MOUTH TWICE DAILY Patient taking differently: Take 100 mg by mouth 2 (two) times daily. 10/27/20 10/27/21  [name], MD  tiZANidine (ZANAFLEX) 4 MG tablet TAKE 1 TABLET BY MOUTH EVERY 6 HOURS AS NEEDED FOR MUSCLE SPASMS Patient taking differently: Take 4 mg by mouth every 6 (six) hours as needed for muscle spasms. 12/19/20 12/19/21  [name], MD  triamcinolone cream (KENALOG) 0.1 % Apply 1 application topically 2 (two) times daily. Use as needed for itchy rash 03/18/21   Copland, Gay Filler, MD  Vitamin D, Ergocalciferol, (DRISDOL) 1.25 MG (50000 UNIT) CAPS capsule TAKE 1 CAPSULE BY MOUTH EVERY 7 DAYS Patient taking differently: Take 50,000 Units by mouth every 7 (seven) days. 10/23/20 10/23/21  [name], MD     Allergies    Morphine and Shellfish-derived products  Review of Systems   Review of Systems  Constitutional: Negative for chills, fatigue and fever.  Respiratory: Negative for cough and shortness of breath.   Cardiovascular: Negative for chest pain and palpitations.  Gastrointestinal: Negative for nausea and vomiting.  Skin: Negative for pallor, rash and wound.  Neurological: Positive for headaches. Negative for dizziness, seizures, syncope and light-headedness.    Physical Exam Updated Vital Signs BP (!) 124/57   Pulse (!) 58   Temp 97.6 F (36.4 C) (Oral)   Resp 18   SpO2 98%   Physical Exam Constitutional:      General: He is not in acute distress.    Appearance: Normal appearance. He is not ill-appearing or diaphoretic.     Comments: Sitting up at the edge of the bed, smiling and conversational  HENT:     Head: Normocephalic and atraumatic.     Mouth/Throat:     Mouth: Mucous membranes are moist.  Eyes:     Extraocular Movements: Extraocular movements intact.     Pupils: Pupils are equal, round, and reactive to light.  Cardiovascular:     Rate and Rhythm: Normal rate and regular rhythm.  Pulmonary:  Effort: Pulmonary effort is normal.     Breath sounds: Normal breath sounds.  Abdominal:     Palpations: Abdomen is soft.  Skin:    General: Skin is warm and dry.     Capillary Refill: Capillary refill takes less than 2 seconds.  Neurological:     General: No focal deficit present.     Mental Status: He is alert and oriented to person, place, and time.     Cranial Nerves: No cranial nerve deficit.     Sensory: No sensory deficit.     Motor: No weakness.     ED Results / Procedures / Treatments   Labs (all labs ordered are listed, but only abnormal results are displayed) Labs Reviewed  COMPREHENSIVE METABOLIC PANEL - Abnormal; Notable for the following components:      Result Value   Glucose, Bld 58 (*)    BUN 24 (*)    Creatinine, Ser 1.79 (*)     GFR, Estimated 40 (*)    All other components within normal limits  CBG MONITORING, ED - Abnormal; Notable for the following components:   Glucose-Capillary 60 (*)    All other components within normal limits  CBG MONITORING, ED - Abnormal; Notable for the following components:   Glucose-Capillary 53 (*)    All other components within normal limits  CBG MONITORING, ED - Abnormal; Notable for the following components:   Glucose-Capillary 207 (*)    All other components within normal limits  CBC WITH DIFFERENTIAL/PLATELET    EKG None  Radiology No results found.  Procedures Procedures   Medications Ordered in ED Medications - No data to display  ED Course  I have reviewed the triage vital signs and the nursing notes.  Pertinent labs & imaging results that were available during my care of the patient were reviewed by me and considered in my medical decision making (see chart for details).    MDM Rules/Calculators/A&P                          75 year old gentleman with a history of insulin-dependent T2DM presenting after hypoglycemic episode with CBG 49 earlier this morning. Upon initial evaluation in the ED, he is now well-appearing, alert, and neurologically intact.  CBG up to the 100s while on D10 via EMS, however in ED return back to 50-60s. Cr at baseline CKD, otherwise labs unremarkable. Given hydration and food with improvement.  Suspect episode is medication related, as he recently switched to Toujeo from TID fiasp.  Spoke with his endocrinologist, Dr. Loanne Drilling, who recommended decreasing to 150U from 200U and scheduled a follow-up visit on Thursday, 5/12.  Case discussed and signed out with Dr. Roslynn Amble.  Plan to repeat CBG around 1630.  If glucose and patient remain stable, plan to discharge home with follow-up as mentioned above.  Final Clinical Impression(s) / ED Diagnoses Final diagnoses:  Hypoglycemia    Rx / DC Orders ED Discharge Orders    None       Patriciaann Clan, DO 04/14/21 1640    Lucrezia Starch, MD 04/14/21 [phone]

## 2021-04-15 ENCOUNTER — Other Ambulatory Visit: Payer: Self-pay | Admitting: Family Medicine

## 2021-04-16 ENCOUNTER — Ambulatory Visit (INDEPENDENT_AMBULATORY_CARE_PROVIDER_SITE_OTHER): Payer: Medicare PPO | Admitting: Endocrinology

## 2021-04-16 ENCOUNTER — Other Ambulatory Visit: Payer: Self-pay

## 2021-04-16 ENCOUNTER — Other Ambulatory Visit (HOSPITAL_BASED_OUTPATIENT_CLINIC_OR_DEPARTMENT_OTHER): Payer: Self-pay

## 2021-04-16 VITALS — BP 160/64 | HR 62 | Ht 69.0 in | Wt 204.1 lb

## 2021-04-16 DIAGNOSIS — E1165 Type 2 diabetes mellitus with hyperglycemia: Secondary | ICD-10-CM | POA: Diagnosis not present

## 2021-04-16 MED ORDER — TOUJEO MAX SOLOSTAR 300 UNIT/ML ~~LOC~~ SOPN
140.0000 [IU] | PEN_INJECTOR | SUBCUTANEOUS | 3 refills | Status: DC
  Filled 2021-04-16: qty 12, 25d supply, fill #0
  Filled 2021-05-05: qty 12, 25d supply, fill #1
  Filled 2021-06-11: qty 12, 25d supply, fill #2

## 2021-04-16 NOTE — Patient Instructions (Addendum)
check your blood sugar twice a day.  vary the time of day when you check, between before the 3 meals, and at bedtime.  also check if you have symptoms of your blood sugar being too high or too low.  please keep a record of the readings and bring it to your next appointment here (or you can bring the meter itself).  You can write it on any piece of paper.  please call us sooner if your blood sugar goes below 70, or if you have a lot of readings over 200.   Please reduce Toujeo to 140 units each morning.   Please come back for a follow-up appointment in 2 months.

## 2021-04-16 NOTE — Progress Notes (Signed)
Subjective:    Patient ID: [name] [name], male    DOB: 29-Feb-1946, 75 y.o.   MRN: [id number]  HPI Pt returns for f/u of diabetes mellitus:  DM type: Insulin-requiring type 2.  Dx'ed: 7416 Complications: PN, stage 3b CRI, CAD and PAD.   Therapy: insulin since 2008.   DKA: never Severe hypoglycemia: never Pancreatitis: never Pancreatic imaging: 2017 CT showed fatty replacement of the pancreatic head and body.   SDOH: he could not afford Trulicity.   Other: he takes QD insulin, after poor results with multiple daily injections; he is not using continuous glucose monitor.   Interval history: He was seen in ER for severe hypoglycemia.  Toujeo was decreased to 150 unit qam.  Since then, cbg varies from 66-300.  It is in general higher as the day goes on.   Past Medical History:  Diagnosis Date  . Acquired atrophy of thyroid 06/05/2013   Overview:  July 2014: controlled on synthroid 31mcg since 2012 May 2015: decreased to Synthroid 50 mcg  Last Assessment & Plan:  Pt doing well with most recent TSH within normal range.  Will continue current dosage of Synthroid 63mcg. Pt reminded to take medication on empty stomach. Will continue to monitor with periodic laboratory assessment in 3 months.   . Anemia    hemoglobin 7.4, iron deficiency, January, 2011, 2 unit transfusion, endoscopy normal, capsule endoscopy February, 2011 no small bowel abnormalities.   Most likely source gastric erosions, followed by GI  . Anxiety   . Bradycardia   . CAD (coronary artery disease)    A. CABG in 2000,status post cardiac cath in 2006, 2009 ....continued chest pain and SOB despite oral medication adjestments including Ranexa. B. Cath November 2009/ mRCA - 2.75 x 23 Abbott Xience V drug-eluting stent ...11/26/2008 to distal  RCA leading to acute marginal.  C. Cath 07/2012 for CP - stable anatomy, med rx. d. cath 2015 and 05/30/2015 stable anatomy, consider Myoview if has CP again  . Carotid artery disease ([place])  08/01/2015   Doppler, May 29, 2015, 1-39% bilateral ICA   . Cerebral ischemia    MRI November, 2010, chronic microvascular ischemia  . CKD (chronic kidney disease), stage III ([place])   . Concussion   . Depression    Bipolar  . Diabetes mellitus ([name] [name])   . Edema   . Essential hypertension 06/05/2013   Overview:  July 2014: Controlled with Bystolic Sep 3845: Changed to atenolol.  Last Assessment & Plan:  Will change to atenolol for cost and follow Improved.  Medication compliance strongly encouraged BP: 116/64 mmHg    Overview:  July 2014: Controlled with Bystolic Sep 3646: Changed to atenolol.  Last Assessment & Plan:  Will change to atenolol for cost and follow  . Falling episodes    these have occurred in the past and again recurring 2011  . Family history of adverse reaction to anesthesia    "mother died during bypass surgery but not sure if it has to do with anesthesia"  . Gastric ulcer   . GERD (gastroesophageal reflux disease)   . H/O medication noncompliance    Due to loss of insurance  . H/O multiple concussions   . Hard of hearing   . History of blood transfusion 12/20/2013  . Hx of CABG    2000,  / one median sternotomy suture broken her chest x-ray November, 2010, no clinical significance  . Hyperlipidemia   . Hypertension    pt. denies  .  Iron deficiency anemia   . Long term (current) use of anticoagulants [Z79.01] 07/20/2016  . Low back pain 06/12/2009   Qualifier: Diagnosis of  By: [name]   . Nephrolithiasis   . Orthostasis   . OSA (obstructive sleep apnea)   . PAF (paroxysmal atrial fibrillation) ([place])    a. dx 2017, started on amiodarone/warfarin but patient intermittently noncompliant.  Marland Kitchen PSVT (paroxysmal supraventricular tachycardia) ([place])   . RBBB 07/09/2009   Qualifier: Diagnosis of  By: [name], MD, [name] [name]   . RLS (restless legs syndrome) 09/19/2009   Qualifier: Diagnosis of  By: [name]   Overview:  July 2014: Controlled with Mirapex   Last Assessment & Plan:  Patient is doing well. Will continue current management and follow clinically.  . Seizure disorder ([name] [name]) 01/09/2017  . Spondylosis    C5-6, C6-7 MRI 2010  . Syncope 03/2016  . Thyroid disease   . Tubulovillous adenoma of colon 2007  . Vitamin D deficiency 05/17/2017  . Wears glasses     Past Surgical History:  Procedure Laterality Date  . ANTERIOR CERVICAL DECOMP/DISCECTOMY FUSION N/A 05/31/2016   Procedure: ANTERIOR CERVICAL DECOMPRESSION/DISCECTOMY FUSION CERVICAL FIVE-SIX,CERVICAL SIX-SEVEN;  Surgeon: [name], MD;  Location: [facility];  Service: Neurosurgery;  Laterality: N/A;  . CARDIAC CATHETERIZATION N/A 05/30/2015   Procedure: Left Heart Cath and Coronary Angiography;  Surgeon: [name], MD;  Location: [facility];  Service: Cardiovascular;  Laterality: N/A;  . COLONOSCOPY    . CORONARY ARTERY BYPASS GRAFT     2000  . ESOPHAGOGASTRODUODENOSCOPY    . LEFT HEART CATH AND CORS/GRAFTS ANGIOGRAPHY N/A 12/15/2017   Procedure: LEFT HEART CATH AND CORS/GRAFTS ANGIOGRAPHY;  Surgeon: [name], MD;  Location: [facility];  Service: Cardiovascular;  Laterality: N/A;  . LEFT HEART CATHETERIZATION WITH CORONARY/GRAFT ANGIOGRAM N/A 08/01/2012   Procedure: LEFT HEART CATHETERIZATION WITH Beatrix Fetters;  Surgeon: [name], MD;  Location: [facility] CATH LAB;  Service: Cardiovascular;  Laterality: N/A;  . LEFT HEART CATHETERIZATION WITH CORONARY/GRAFT ANGIOGRAM N/A 01/03/2015   Procedure: LEFT HEART CATHETERIZATION WITH Beatrix Fetters;  Surgeon: [name], MD;  Location: [facility] CATH LAB;  Service: Cardiovascular;  Laterality: N/A;  . NASAL SEPTUM SURGERY     UP3  . PERCUTANEOUS CORONARY STENT INTERVENTION (PCI-S)  10/2008   mRCA PCI  2.75 x 23 Abbott Xience V drug-eluting stent   . RIGHT/LEFT HEART CATH AND CORONARY/GRAFT ANGIOGRAPHY N/A 01/07/2020   Procedure: RIGHT/LEFT HEART CATH AND CORONARY/GRAFT ANGIOGRAPHY;   Surgeon: [name], MD;  Location: [place];  Service: Cardiovascular;  Laterality: N/A;  . ULTRASOUND GUIDANCE FOR VASCULAR ACCESS  12/15/2017   Procedure: Ultrasound Guidance For Vascular Access;  Surgeon: [name], MD;  Location: [facility];  Service: Cardiovascular;;    Social History   Socioeconomic History  . Marital status: Married    Spouse name: Not on file  . Number of children: 4  . Years of education: 26  . Highest education level: Not on file  Occupational History  . Occupation: retired  Tobacco Use  . Smoking status: Never Smoker  . Smokeless tobacco: Never Used  Vaping Use  . Vaping Use: Never used  Substance and Sexual Activity  . Alcohol use: No    Alcohol/week: 0.0 standard drinks    Comment: stopped drinking in 1998  . Drug use: No  . Sexual activity: Not on file  Other Topics Concern  . Not on file  Social History Narrative   Patient is right handed.   Patient does not drink caffeine.   Social Determinants of Health   Financial Resource Strain: Not on file  Food Insecurity: Not on file  Transportation Needs: Not on file  Physical Activity: Not on file  Stress: Not on file  Social Connections: Not on file  Intimate Partner Violence: Not on file    Current Outpatient Medications on File Prior to Visit  Medication Sig Dispense Refill  . albuterol (VENTOLIN HFA) 108 (90 Base) MCG/ACT inhaler Inhale 2 puffs into the lungs every 6 (six) hours as needed for wheezing or shortness of breath. 18 g 2  . amLODipine (NORVASC) 5 MG tablet Take 1 tablet (5 mg total) by mouth at bedtime. 90 tablet 2  . apixaban (ELIQUIS) 5 MG TABS tablet TAKE 1 TABLET BY MOUTH TWICE DAILY (Patient taking differently: Take 5 mg by mouth 2 (two) times daily.) 60 tablet 5  . atorvastatin (LIPITOR) 80 MG tablet Take 1 tablet (80 mg total) by mouth daily. (Patient taking differently: Take 80 mg by mouth at bedtime.) 90 tablet 3  . cetirizine (ZYRTEC)  10 MG tablet TAKE 1 TABLET (10 MG TOTAL) BY MOUTH DAILY. (Patient taking differently: Take 10 mg by mouth daily.) 100 tablet 2  . Continuous Blood Gluc Receiver (FREESTYLE LIBRE 14 DAY READER) DEVI For continuous Blood Glucose monitoring 1 each 0  . Continuous Blood Gluc Sensor (FREESTYLE LIBRE 14 DAY SENSOR) MISC USE TO CHECK BLOOD SUGAR AND CHANGE EVERY 14 DAYS 6 each 3  . Diclofenac Sodium (PENNSAID) 2 % SOLN Place 1 application onto the skin 2 (two) times daily. 112 g 2  . famotidine (PEPCID) 20 MG tablet TAKE 1 TABLET BY MOUTH 2 TIMES DAILY (Patient taking differently: Take 40 mg by mouth daily.) 60 tablet 2  . fenofibrate (TRICOR) 145 MG tablet TAKE 1 TABLET (145 MG TOTAL) BY MOUTH DAILY. (Patient taking differently: Take 145 mg by mouth daily.) 90 tablet 1  . furosemide (LASIX) 20 MG tablet TAKE 1 TABLET BY MOUTH DAILY AS NEEDED FOR EDEMA (Patient taking differently: Take 20 mg by mouth daily as needed for edema.) 30 tablet 3  . glucose blood (ACCU-CHEK AVIVA) test strip Use as directed to check blood sugar 4 times daily.  DX E11.9 200 each 6  . HYDROcodone-acetaminophen (NORCO/VICODIN) 5-325 MG tablet TAKE 1 TABLET BY MOUTH EVERY 8 (EIGHT) HOURS AS NEEDED. (Patient taking differently: Take 1 tablet by mouth every 6 (six) hours as needed for severe pain.) 15 tablet 0  . influenza vaccine adjuvanted (FLUAD) 0.5 ML injection TAKE AS DIRECTED .5 mL 0  . Insulin Pen Needle 31G X 8 MM MISC USE TO CHECK BLOOD SUGAR THREE TIME DAILY 100 each 5  . Insulin Pen Needle 31G X 8 MM MISC use to inject insulin twice a day 100 each 3  . Insulin Syringe-Needle U-100 31G X 5/16" 1 ML MISC USE AS DIRECTED WITH NOVOLIN 100 each 0  . Insulin Syringe-Needle U-100 31G X 5/16" 1 ML MISC USE AS DIRECTED WITH NOVOLIN 100 each 0  . isosorbide mononitrate (IMDUR) 120 MG 24 hr tablet TAKE 1 TABLET (120 MG TOTAL) BY MOUTH DAILY. (Patient taking differently: Take 120 mg by mouth daily.) 90 tablet 3  . levothyroxine  (SYNTHROID) 100 MCG tablet TAKE 1 TABLET (100 MCG TOTAL) BY MOUTH DAILY. (Patient taking differently: Take 100 mcg by mouth daily.) 90 tablet 1  .  metoprolol succinate (TOPROL-XL) 50 MG 24 hr tablet TAKE 1 AND 1/2 TABLETS EVERY DAY FOR A TOTAL OF 75 MG (Patient taking differently: Take 75 mg by mouth daily.) 135 tablet 3  . nitroGLYCERIN (NITROSTAT) 0.4 MG SL tablet PLACE 1 TABLET UNDER THE TONGUE EVERY 5 MINUTES AS NEEDED FOR CHEST PAIN. (Patient taking differently: Place 0.4 mg under the tongue every 5 (five) minutes as needed for chest pain.) 25 tablet 1  . pantoprazole (PROTONIX) 40 MG tablet TAKE 1 TABLET EVERY DAY 90 tablet 3  . pramipexole (MIRAPEX) 1 MG tablet TAKE 1/2 TO 1 TABLET AT BEDTIME (Patient taking differently: Take 0.5-1 mg by mouth at bedtime.) 90 tablet 0  . ranolazine (RANEXA) 500 MG 12 hr tablet TAKE 1 TABLET BY MOUTH TWICE DAILY (Patient taking differently: Take 500 mg by mouth 2 (two) times daily.) 60 tablet 5  . sertraline (ZOLOFT) 100 MG tablet TAKE 1 TABLET BY MOUTH TWICE DAILY (Patient taking differently: Take 100 mg by mouth 2 (two) times daily.) 180 tablet 1  . tiZANidine (ZANAFLEX) 4 MG tablet TAKE 1 TABLET BY MOUTH EVERY 6 HOURS AS NEEDED FOR MUSCLE SPASMS (Patient taking differently: Take 4 mg by mouth every 6 (six) hours as needed for muscle spasms.) 40 tablet 2  . triamcinolone cream (KENALOG) 0.1 % Apply 1 application topically 2 (two) times daily. Use as needed for itchy rash 30 g 1  . Vitamin D, Ergocalciferol, (DRISDOL) 1.25 MG (50000 UNIT) CAPS capsule TAKE 1 CAPSULE BY MOUTH EVERY 7 DAYS (Patient taking differently: Take 50,000 Units by mouth every 7 (seven) days.) 4 capsule 4   No current facility-administered medications on file prior to visit.    Allergies  Allergen Reactions  . Morphine Other (See Comments)    hallucinations  . Shellfish-Derived Products Itching    Family History  Problem Relation Age of Onset  . Pancreatic cancer Brother   .  Diabetes Brother   . Coronary artery disease Brother   . Stroke Brother   . Diabetes Brother   . Diabetes Mother   . Heart failure Mother   . Heart failure Father   . Hypothyroidism Brother   . Coronary artery disease Brother   . Other Brother        colon surgery  . Heart attack Other        Nephew  . Irregular heart beat Daughter   . Cancer Maternal Grandmother        unknown   . Colon cancer Neg Hx   . Stomach cancer Neg Hx   . Liver cancer Neg Hx   . Rectal cancer Neg Hx   . Esophageal cancer Neg Hx     BP (!) 160/64 (BP Location: Right Arm, Patient Position: Sitting, Cuff Size: Normal)   Pulse 62   Ht 5\' 9"  (1.753 m)   Wt 204 lb 1.6 oz (92.6 kg)   SpO2 98%   BMI 30.14 kg/m   Review of Systems Denies n/v.      Objective:   Physical Exam VITAL SIGNS:  See vs page GENERAL: no distress      Assessment & Plan:  Insulin-requiring type 2 DM. Hypoglycemia, severe, due to insulin.  Due to improved med compliance when he was changed to qd insulin.   He may need a faster-acting qam insulin.   Patient Instructions  check your blood sugar twice a day.  vary the time of day when you check, between before the 3 meals, and at  bedtime.  also check if you have symptoms of your blood sugar being too high or too low.  please keep a record of the readings and bring it to your next appointment here (or you can bring the meter itself).  You can write it on any piece of paper.  please call us sooner if your blood sugar goes below 70, or if you have a lot of readings over 200.   Please reduce Toujeo to 140 units each morning.   Please come back for a follow-up appointment in 2 months.

## 2021-04-17 ENCOUNTER — Other Ambulatory Visit (HOSPITAL_BASED_OUTPATIENT_CLINIC_OR_DEPARTMENT_OTHER): Payer: Self-pay

## 2021-04-17 ENCOUNTER — Other Ambulatory Visit (HOSPITAL_COMMUNITY): Payer: Self-pay

## 2021-04-20 ENCOUNTER — Telehealth: Payer: Self-pay | Admitting: Endocrinology

## 2021-04-20 ENCOUNTER — Ambulatory Visit: Payer: Medicare PPO | Admitting: Interventional Cardiology

## 2021-04-20 ENCOUNTER — Other Ambulatory Visit: Payer: Self-pay | Admitting: Family Medicine

## 2021-04-20 NOTE — Telephone Encounter (Signed)
Patient called about Toujeo.  Ran ou this morning and only had 70 units to take and wont be able to pick up medication until he gets money. Does have left over [place] and wants to know if he can take that until he can get the Toujeo filled.  Call back # [phone]

## 2021-04-21 NOTE — Telephone Encounter (Signed)
You should buy NPH at [facility]. Take 110 units qam and 30 units qpm.

## 2021-04-21 NOTE — Telephone Encounter (Signed)
Message sent thru MyChart 

## 2021-04-21 NOTE — Telephone Encounter (Signed)
Please advise 

## 2021-04-27 ENCOUNTER — Telehealth: Payer: Self-pay | Admitting: *Deleted

## 2021-04-27 NOTE — Telephone Encounter (Signed)
Pt would like the vv appointment 04/28/21

## 2021-04-27 NOTE — Telephone Encounter (Signed)
Can we give him the 3:40 tomorrow. Can even be virtual. We could order a CT of head from that and some lab work to get started then refer to neurology.
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
## 2021-04-27 NOTE — Telephone Encounter (Signed)
I will be happy to schedule appointment but all we have is the afternoon appts in about 2-3 weeks or so.

## 2021-04-28 ENCOUNTER — Ambulatory Visit: Payer: Medicare PPO | Admitting: Endocrinology

## 2021-04-28 ENCOUNTER — Other Ambulatory Visit (HOSPITAL_BASED_OUTPATIENT_CLINIC_OR_DEPARTMENT_OTHER): Payer: Self-pay

## 2021-04-28 ENCOUNTER — Telehealth (INDEPENDENT_AMBULATORY_CARE_PROVIDER_SITE_OTHER): Payer: Medicare PPO | Admitting: Family Medicine

## 2021-04-28 ENCOUNTER — Other Ambulatory Visit: Payer: Self-pay

## 2021-04-28 DIAGNOSIS — F32A Depression, unspecified: Secondary | ICD-10-CM

## 2021-04-28 DIAGNOSIS — E559 Vitamin D deficiency, unspecified: Secondary | ICD-10-CM | POA: Diagnosis not present

## 2021-04-28 DIAGNOSIS — N184 Chronic kidney disease, stage 4 (severe): Secondary | ICD-10-CM

## 2021-04-28 DIAGNOSIS — I10 Essential (primary) hypertension: Secondary | ICD-10-CM

## 2021-04-28 DIAGNOSIS — F688 Other specified disorders of adult personality and behavior: Secondary | ICD-10-CM

## 2021-04-28 DIAGNOSIS — E034 Atrophy of thyroid (acquired): Secondary | ICD-10-CM

## 2021-04-28 DIAGNOSIS — E1165 Type 2 diabetes mellitus with hyperglycemia: Secondary | ICD-10-CM | POA: Diagnosis not present

## 2021-04-28 DIAGNOSIS — G3184 Mild cognitive impairment, so stated: Secondary | ICD-10-CM

## 2021-04-28 DIAGNOSIS — R519 Headache, unspecified: Secondary | ICD-10-CM

## 2021-04-28 MED ORDER — BUSPIRONE HCL 7.5 MG PO TABS
7.5000 mg | ORAL_TABLET | Freq: Two times a day (BID) | ORAL | 1 refills | Status: DC
Start: 2021-04-28 — End: 2021-07-13
  Filled 2021-04-28: qty 60, 30d supply, fill #0
  Filled 2021-06-04: qty 60, 30d supply, fill #1

## 2021-04-29 ENCOUNTER — Other Ambulatory Visit: Payer: Self-pay | Admitting: Family Medicine

## 2021-04-29 ENCOUNTER — Other Ambulatory Visit (HOSPITAL_BASED_OUTPATIENT_CLINIC_OR_DEPARTMENT_OTHER): Payer: Self-pay

## 2021-04-29 MED ORDER — TIZANIDINE HCL 4 MG PO TABS
ORAL_TABLET | Freq: Four times a day (QID) | ORAL | 2 refills | Status: DC | PRN
  Filled 2021-04-29: qty 40, 10d supply, fill #0

## 2021-04-29 NOTE — Assessment & Plan Note (Signed)
minimize simple carbs. Increase exercise as tolerated. Continue current meds  

## 2021-04-29 NOTE — Assessment & Plan Note (Signed)
Monitor and report any concerns, no changes to meds. Encouraged heart healthy diet such as the DASH diet and exercise as tolerated.  ?

## 2021-04-29 NOTE — Assessment & Plan Note (Signed)
Supplement and monitor 

## 2021-04-29 NOTE — Assessment & Plan Note (Signed)
Is increasingly stressed and irritable withincreasing anxiety. Continue Sertraline 100 mg bid and add Buspar 6.5 mg twice a day.

## 2021-04-29 NOTE — Assessment & Plan Note (Signed)
On Levothyroxine, continue to monitor 

## 2021-04-29 NOTE — Progress Notes (Signed)
MyChart Video Visit    Virtual Visit via Video Note   This visit type was conducted due to national recommendations for restrictions regarding the COVID-19 Pandemic (e.g. social distancing) in an effort to limit this patient's exposure and mitigate transmission in our community. This patient is at least at moderate risk for complications without adequate follow up. This format is felt to be most appropriate for this patient at this time. Physical exam was limited by quality of the video and audio technology used for the visit. S chism, CMA was able to get the patient set up on a video visit.  Patient location: home Patient and provider in visit Provider location: Office  I discussed the limitations of evaluation and management by telemedicine and the availability of in person appointments. The patient expressed understanding and agreed to proceed.  Visit Date: 04/28/2021  Today's healthcare provider: Penni Homans, MD     Subjective:    Patient ID: [name] [name], male    DOB: 1946-07-06, 75 y.o.   MRN: [id number]  Chief Complaint  Patient presents with  . Follow-up    HTN, Pt's daughter has concerns of alzheimer's.    HPI Patient is in today for evaluation of worsening memory loss, irritability and headaches. He is accompanied by his daughter. They both agree he is becoming increasingly irritable and unpredictable, he has a quick temper and is more easily forgetful. He is having daily headaches as well. No recent febrile illness or hospitalizations. Denies CP/palp/SOB/HA/congestion/fevers/GI or GU c/o. Taking meds as prescribed  Past Medical History:  Diagnosis Date  . Acquired atrophy of thyroid 06/05/2013   Overview:  July 2014: controlled on synthroid 30mcg since 2012 May 2015: decreased to Synthroid 50 mcg  Last Assessment & Plan:  Pt doing well with most recent TSH within normal range.  Will continue current dosage of Synthroid 46mcg. Pt reminded to take medication on  empty stomach. Will continue to monitor with periodic laboratory assessment in 3 months.   . Anemia    hemoglobin 7.4, iron deficiency, January, 2011, 2 unit transfusion, endoscopy normal, capsule endoscopy February, 2011 no small bowel abnormalities.   Most likely source gastric erosions, followed by GI  . Anxiety   . Bradycardia   . CAD (coronary artery disease)    A. CABG in 2000,status post cardiac cath in 2006, 2009 ....continued chest pain and SOB despite oral medication adjestments including Ranexa. B. Cath November 2009/ mRCA - 2.75 x 23 Abbott Xience V drug-eluting stent ...11/26/2008 to distal  RCA leading to acute marginal.  C. Cath 07/2012 for CP - stable anatomy, med rx. d. cath 2015 and 05/30/2015 stable anatomy, consider Myoview if has CP again  . Carotid artery disease ([place]) 08/01/2015   Doppler, May 29, 2015, 1-39% bilateral ICA   . Cerebral ischemia    MRI November, 2010, chronic microvascular ischemia  . CKD (chronic kidney disease), stage III ([place])   . Concussion   . Depression    Bipolar  . Diabetes mellitus ([place])   . Edema   . Essential hypertension 06/05/2013   Overview:  July 2014: Controlled with Bystolic Sep 7564: Changed to atenolol.  Last Assessment & Plan:  Will change to atenolol for cost and follow Improved.  Medication compliance strongly encouraged BP: 116/64 mmHg    Overview:  July 2014: Controlled with Bystolic Sep 3329: Changed to atenolol.  Last Assessment & Plan:  Will change to atenolol for cost and follow  . Falling episodes  these have occurred in the past and again recurring 2011  . Family history of adverse reaction to anesthesia    "mother died during bypass surgery but not sure if it has to do with anesthesia"  . Gastric ulcer   . GERD (gastroesophageal reflux disease)   . H/O medication noncompliance    Due to loss of insurance  . H/O multiple concussions   . Hard of hearing   . History of blood transfusion 12/20/2013  . Hx of CABG    2000,  /  one median sternotomy suture broken her chest x-ray November, 2010, no clinical significance  . Hyperlipidemia   . Hypertension    pt. denies  . Iron deficiency anemia   . Long term (current) use of anticoagulants [Z79.01] 07/20/2016  . Low back pain 06/12/2009   Qualifier: Diagnosis of  By: [name]   . Nephrolithiasis   . Orthostasis   . OSA (obstructive sleep apnea)   . PAF (paroxysmal atrial fibrillation) ([place])    a. dx 2017, started on amiodarone/warfarin but patient intermittently noncompliant.  Marland Kitchen PSVT (paroxysmal supraventricular tachycardia) ([place])   . RBBB 07/09/2009   Qualifier: Diagnosis of  By: [name], MD, [name] [name]   . RLS (restless legs syndrome) 09/19/2009   Qualifier: Diagnosis of  By: [name]   Overview:  July 2014: Controlled with Mirapex  Last Assessment & Plan:  Patient is doing well. Will continue current management and follow clinically.  . Seizure disorder ([place]) 01/09/2017  . Spondylosis    C5-6, C6-7 MRI 2010  . Syncope 03/2016  . Thyroid disease   . Tubulovillous adenoma of colon 2007  . Vitamin D deficiency 05/17/2017  . Wears glasses     Past Surgical History:  Procedure Laterality Date  . ANTERIOR CERVICAL DECOMP/DISCECTOMY FUSION N/A 05/31/2016   Procedure: ANTERIOR CERVICAL DECOMPRESSION/DISCECTOMY FUSION CERVICAL FIVE-SIX,CERVICAL SIX-SEVEN;  Surgeon: [name], MD;  Location: [facility];  Service: Neurosurgery;  Laterality: N/A;  . CARDIAC CATHETERIZATION N/A 05/30/2015   Procedure: Left Heart Cath and Coronary Angiography;  Surgeon: [name], MD;  Location: [facility];  Service: Cardiovascular;  Laterality: N/A;  . COLONOSCOPY    . CORONARY ARTERY BYPASS GRAFT     2000  . ESOPHAGOGASTRODUODENOSCOPY    . LEFT HEART CATH AND CORS/GRAFTS ANGIOGRAPHY N/A 12/15/2017   Procedure: LEFT HEART CATH AND CORS/GRAFTS ANGIOGRAPHY;  Surgeon: [name], MD;  Location: [facility];  Service: Cardiovascular;   Laterality: N/A;  . LEFT HEART CATHETERIZATION WITH CORONARY/GRAFT ANGIOGRAM N/A 08/01/2012   Procedure: LEFT HEART CATHETERIZATION WITH Beatrix Fetters;  Surgeon: [name], MD;  Location: [facility] C[facility];  Service: Cardiovascular;  Laterality: N/A;  . LEFT HEART CATHETERIZATION WITH CORONARY/GRAFT ANGIOGRAM N/A 01/03/2015   Procedure: LEFT HEART CATHETERIZATION WITH Beatrix Fetters;  Surgeon: [name], MD;  Location: [facility] CATH LAB;  Service: Cardiovascular;  Laterality: N/A;  . NASAL SEPTUM SURGERY     UP3  . PERCUTANEOUS CORONARY STENT INTERVENTION (PCI-S)  10/2008   mRCA PCI  2.75 x 23 Abbott Xience V drug-eluting stent   . RIGHT/LEFT HEART CATH AND CORONARY/GRAFT ANGIOGRAPHY N/A 01/07/2020   Procedure: RIGHT/LEFT HEART CATH AND CORONARY/GRAFT ANGIOGRAPHY;  Surgeon: [name], MD;  Location: [facility] CV LAB;  Service: Cardiovascular;  Laterality: N/A;  . ULTRASOUND GUIDANCE FOR VASCULAR ACCESS  12/15/2017   Procedure: Ultrasound Guidance For Vascular Access;  Surgeon: [name], MD;  Location: [facility];  Service: Cardiovascular;;    Family History  Problem Relation Age of Onset  . Pancreatic cancer Brother   . Diabetes Brother   . Coronary artery disease Brother   . Stroke Brother   . Diabetes Brother   . Diabetes Mother   . Heart failure Mother   . Heart failure Father   . Hypothyroidism Brother   . Coronary artery disease Brother   . Other Brother        colon surgery  . Heart attack Other        Nephew  . Irregular heart beat Daughter   . Cancer Maternal Grandmother        unknown   . Colon cancer Neg Hx   . Stomach cancer Neg Hx   . Liver cancer Neg Hx   . Rectal cancer Neg Hx   . Esophageal cancer Neg Hx     Social History   Socioeconomic History  . Marital status: Married    Spouse name: Not on file  . Number of children: 4  . Years of education: 28  . Highest education level: Not on file  Occupational  History  . Occupation: retired  Tobacco Use  . Smoking status: Never Smoker  . Smokeless tobacco: Never Used  Vaping Use  . Vaping Use: Never used  Substance and Sexual Activity  . Alcohol use: No    Alcohol/week: 0.0 standard drinks    Comment: stopped drinking in 1998  . Drug use: No  . Sexual activity: Not on file  Other Topics Concern  . Not on file  Social History Narrative   Patient is right handed.   Patient does not drink caffeine.   Social Determinants of Health   Financial Resource Strain: Not on file  Food Insecurity: Not on file  Transportation Needs: Not on file  Physical Activity: Not on file  Stress: Not on file  Social Connections: Not on file  Intimate Partner Violence: Not on file    Outpatient Medications Prior to Visit  Medication Sig Dispense Refill  . albuterol (VENTOLIN HFA) 108 (90 Base) MCG/ACT inhaler Inhale 2 puffs into the lungs every 6 (six) hours as needed for wheezing or shortness of breath. 18 g 2  . amLODipine (NORVASC) 5 MG tablet Take 1 tablet (5 mg total) by mouth at bedtime. 90 tablet 2  . apixaban (ELIQUIS) 5 MG TABS tablet TAKE 1 TABLET BY MOUTH TWICE DAILY (Patient taking differently: Take 5 mg by mouth 2 (two) times daily.) 60 tablet 5  . atorvastatin (LIPITOR) 80 MG tablet Take 1 tablet (80 mg total) by mouth daily. (Patient taking differently: Take 80 mg by mouth at bedtime.) 90 tablet 3  . cetirizine (ZYRTEC) 10 MG tablet TAKE 1 TABLET (10 MG TOTAL) BY MOUTH DAILY. (Patient taking differently: Take 10 mg by mouth daily.) 100 tablet 2  . Continuous Blood Gluc Receiver (FREESTYLE LIBRE 14 DAY READER) DEVI For continuous Blood Glucose monitoring 1 each 0  . Continuous Blood Gluc Sensor (FREESTYLE LIBRE 14 DAY SENSOR) MISC USE TO CHECK BLOOD SUGAR AND CHANGE EVERY 14 DAYS 6 each 3  . Diclofenac Sodium (PENNSAID) 2 % SOLN Place 1 application onto the skin 2 (two) times daily. 112 g 2  . famotidine (PEPCID) 20 MG tablet TAKE 1 TABLET BY  MOUTH 2 TIMES DAILY (Patient taking differently: Take 40 mg by mouth daily.) 60 tablet 2  . fenofibrate (TRICOR) 145 MG tablet TAKE 1 TABLET (  145 MG TOTAL) BY MOUTH DAILY. (Patient taking differently: Take 145 mg by mouth daily.) 90 tablet 1  . furosemide (LASIX) 20 MG tablet TAKE 1 TABLET BY MOUTH DAILY AS NEEDED FOR EDEMA (Patient taking differently: Take 20 mg by mouth daily as needed for edema.) 30 tablet 3  . glucose blood (ACCU-CHEK AVIVA) test strip Use as directed to check blood sugar 4 times daily.  DX E11.9 200 each 6  . HYDROcodone-acetaminophen (NORCO/VICODIN) 5-325 MG tablet TAKE 1 TABLET BY MOUTH EVERY 8 (EIGHT) HOURS AS NEEDED. (Patient taking differently: Take 1 tablet by mouth every 6 (six) hours as needed for severe pain.) 15 tablet 0  . influenza vaccine adjuvanted (FLUAD) 0.5 ML injection TAKE AS DIRECTED .5 mL 0  . insulin glargine, 2 Unit Dial, (TOUJEO MAX SOLOSTAR) 300 UNIT/ML Solostar Pen Inject 140 Units into the skin every morning. 48 mL 3  . Insulin Pen Needle 31G X 8 MM MISC USE TO CHECK BLOOD SUGAR THREE TIME DAILY 100 each 5  . Insulin Pen Needle 31G X 8 MM MISC use to inject insulin twice a day 100 each 3  . Insulin Syringe-Needle U-100 31G X 5/16" 1 ML MISC USE AS DIRECTED WITH NOVOLIN 100 each 0  . Insulin Syringe-Needle U-100 31G X 5/16" 1 ML MISC USE AS DIRECTED WITH NOVOLIN 100 each 0  . isosorbide mononitrate (IMDUR) 120 MG 24 hr tablet TAKE 1 TABLET (120 MG TOTAL) BY MOUTH DAILY. (Patient taking differently: Take 120 mg by mouth daily.) 90 tablet 3  . levothyroxine (SYNTHROID) 100 MCG tablet TAKE 1 TABLET (100 MCG TOTAL) BY MOUTH DAILY. (Patient taking differently: Take 100 mcg by mouth daily.) 90 tablet 1  . metoprolol succinate (TOPROL-XL) 50 MG 24 hr tablet TAKE 1 AND 1/2 TABLETS EVERY DAY FOR A TOTAL OF 75 MG (Patient taking differently: Take 75 mg by mouth daily.) 135 tablet 3  . nitroGLYCERIN (NITROSTAT) 0.4 MG SL tablet PLACE 1 TABLET UNDER THE TONGUE EVERY  5 MINUTES AS NEEDED FOR CHEST PAIN. (Patient taking differently: Place 0.4 mg under the tongue every 5 (five) minutes as needed for chest pain.) 25 tablet 1  . pantoprazole (PROTONIX) 40 MG tablet TAKE 1 TABLET EVERY DAY 90 tablet 3  . pramipexole (MIRAPEX) 1 MG tablet TAKE 1/2 TO 1 TABLET AT BEDTIME 90 tablet 0  . ranolazine (RANEXA) 500 MG 12 hr tablet TAKE 1 TABLET BY MOUTH TWICE DAILY (Patient taking differently: Take 500 mg by mouth 2 (two) times daily.) 60 tablet 5  . sertraline (ZOLOFT) 100 MG tablet TAKE 1 TABLET BY MOUTH TWICE DAILY (Patient taking differently: Take 100 mg by mouth 2 (two) times daily.) 180 tablet 1  . triamcinolone cream (KENALOG) 0.1 % Apply 1 application topically 2 (two) times daily. Use as needed for itchy rash 30 g 1  . Vitamin D, Ergocalciferol, (DRISDOL) 1.25 MG (50000 UNIT) CAPS capsule TAKE 1 CAPSULE BY MOUTH EVERY 7 DAYS (Patient taking differently: Take 50,000 Units by mouth every 7 (seven) days.) 4 capsule 4  . tiZANidine (ZANAFLEX) 4 MG tablet TAKE 1 TABLET BY MOUTH EVERY 6 HOURS AS NEEDED FOR MUSCLE SPASMS (Patient taking differently: Take 4 mg by mouth every 6 (six) hours as needed for muscle spasms.) 40 tablet 2   No facility-administered medications prior to visit.    Allergies  Allergen Reactions  . Morphine Other (See Comments)    hallucinations  . Shellfish-Derived Products Itching    Review of Systems  Constitutional: Positive  for malaise/fatigue. Negative for fever.  HENT: Negative for congestion.   Eyes: Negative for blurred vision.  Respiratory: Negative for cough.   Cardiovascular: Negative for chest pain and palpitations.  Gastrointestinal: Negative for vomiting.  Musculoskeletal: Negative for back pain.  Skin: Negative for rash.  Neurological: Negative for loss of consciousness and headaches.  Psychiatric/Behavioral: Positive for depression and memory loss. Negative for hallucinations and substance abuse. The patient is  nervous/anxious.        Objective:    Physical Exam Constitutional:      Appearance: Normal appearance. He is not ill-appearing.  HENT:     Head: Normocephalic and atraumatic.     Right Ear: External ear normal.     Left Ear: External ear normal.     Nose: Nose normal.  Pulmonary:     Effort: Pulmonary effort is normal.  Neurological:     Mental Status: He is alert and oriented to person, place, and time.  Psychiatric:        Behavior: Behavior normal.     There were no vitals taken for this visit. Wt Readings from Last 3 Encounters:  04/16/21 204 lb 1.6 oz (92.6 kg)  04/03/21 207 lb (93.9 kg)  03/25/21 206 lb (93.4 kg)    Diabetic Foot Exam - Simple   No data filed    Lab Results  Component Value Date   WBC 7.9 04/14/2021   HGB 13.9 04/14/2021   HCT 41.3 04/14/2021   PLT 243 04/14/2021   GLUCOSE 58 (L) 04/14/2021   CHOL 115 02/23/2021   TRIG 129.0 02/23/2021   HDL 38.40 (L) 02/23/2021   LDLDIRECT 77.0 08/20/2019   LDLCALC 51 02/23/2021   ALT 16 04/14/2021   AST 22 04/14/2021   NA 138 04/14/2021   K 3.9 04/14/2021   CL 105 04/14/2021   CREATININE 1.79 (H) 04/14/2021   BUN 24 (H) 04/14/2021   CO2 25 04/14/2021   TSH 3.87 02/23/2021   PSA 3.14 08/20/2019   INR 1.3 (H) 03/25/2021   HGBA1C 8.9 (H) 03/25/2021   MICROALBUR <0.7 05/14/2019    Lab Results  Component Value Date   TSH 3.87 02/23/2021   Lab Results  Component Value Date   WBC 7.9 04/14/2021   HGB 13.9 04/14/2021   HCT 41.3 04/14/2021   MCV 90.6 04/14/2021   PLT 243 04/14/2021   Lab Results  Component Value Date   NA 138 04/14/2021   K 3.9 04/14/2021   CO2 25 04/14/2021   GLUCOSE 58 (L) 04/14/2021   BUN 24 (H) 04/14/2021   CREATININE 1.79 (H) 04/14/2021   BILITOT 1.0 04/14/2021   ALKPHOS 42 04/14/2021   AST 22 04/14/2021   ALT 16 04/14/2021   PROT 7.3 04/14/2021   ALBUMIN 4.2 04/14/2021   CALCIUM 9.4 04/14/2021   ANIONGAP 8 04/14/2021   GFR 31.41 (L) 03/11/2021   Lab  Results  Component Value Date   CHOL 115 02/23/2021   Lab Results  Component Value Date   HDL 38.40 (L) 02/23/2021   Lab Results  Component Value Date   LDLCALC 51 02/23/2021   Lab Results  Component Value Date   TRIG 129.0 02/23/2021   Lab Results  Component Value Date   CHOLHDL 3 02/23/2021   Lab Results  Component Value Date   HGBA1C 8.9 (H) 03/25/2021       Assessment & Plan:   Problem List Items Addressed This Visit    Diabetes mellitus type II, uncontrolled ([place]) (Chronic)  minimize simple carbs. Increase exercise as tolerated. Continue current meds      Depression    Is increasingly stressed and irritable withincreasing anxiety. Continue Sertraline 100 mg bid and add Buspar 6.5 mg twice a day.      Relevant Medications   busPIRone (BUSPAR) 7.5 MG tablet   Other Relevant Orders   Ambulatory referral to Neurology   CT Head Wo Contrast   Essential hypertension    Monitor and report any concerns, no changes to meds. Encouraged heart healthy diet such as the DASH diet and exercise as tolerated.       Headache    Encouraged increased hydration, 64 ounces of clear fluids daily. Minimize alcohol and caffeine. Eat small frequent meals with lean proteins and complex carbs. Avoid high and low blood sugars. Get adequate sleep, 7-8 hours a night. Needs exercise daily preferably in the morning. Referred to neurology for further treatment.       Relevant Orders   Ambulatory referral to Neurology   CT Head Wo Contrast   Vitamin D deficiency    Supplement and monitor      Hypothyroidism due to acquired atrophy of thyroid    On Levothyroxine, continue to monitor      MCI (mild cognitive impairment)    He acknowledges his memory is worsening and he and his daughter report he is also beginning to have some personality changes. Much more irritable and quick tempered. More impulsive. He agrees to imaging and referral to neurology for further evaluation. 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 CT scan is also ordered      Relevant Orders   Ambulatory referral to Neurology   CT Head Wo Contrast   CKD (chronic kidney disease), stage IV ([place])    Hydrate and monitor       Other Visit Diagnoses    Personality change    -  Primary   Relevant Orders   Ambulatory referral to Neurology   CT Head Wo Contrast      I am having Dacian A. Koy start on busPIRone. I am also having him maintain his glucose blood, albuterol, nitroGLYCERIN, FreeStyle Libre 14 Day Reader, [place], metoprolol succinate, amLODipine, famotidine, cetirizine, FreeStyle Libre 14 Day Sensor, HYDROcodone-acetaminophen, Insulin Syringe-Needle U-100, Insulin Syringe-Needle U-100, furosemide, levothyroxine, apixaban, fenofibrate, Insulin Pen Needle, sertraline, Vitamin D (Ergocalciferol), isosorbide mononitrate, influenza vaccine adjuvanted, ranolazine, atorvastatin, triamcinolone cream, Insulin Pen Needle, pantoprazole, Toujeo Max SoloStar, and pramipexole.  Meds ordered this encounter  Medications  . busPIRone (BUSPAR) 7.5 MG tablet    Sig: Take 1 tablet (7.5 mg total) by mouth 2 (two) times daily.    Dispense:  60 tablet    Refill:  1    I discussed the assessment and treatment plan with the patient. The patient was provided an opportunity to ask questions and all were answered. The patient agreed with the plan and demonstrated an understanding of the instructions.   The patient was advised to call back or seek an in-person evaluation if the symptoms worsen or if the condition fails to improve as anticipated.  I provided 26 minutes of face-to-face time during this encounter.   Penni Homans, MD [facility] at [facility] [phone] (phone) [phone] (fax)  [place]

## 2021-04-29 NOTE — Assessment & Plan Note (Signed)
Encouraged increased hydration, 64 ounces of clear fluids daily. Minimize alcohol and caffeine. Eat small frequent meals with lean proteins and complex carbs. Avoid high and low blood sugars. Get adequate sleep, 7-8 hours a night. Needs exercise daily preferably in the morning. Referred to neurology for further treatment.

## 2021-04-29 NOTE — Assessment & Plan Note (Signed)
Hydrate and monitor 

## 2021-04-29 NOTE — Assessment & Plan Note (Addendum)
He acknowledges his memory is worsening and he and his daughter report he is also beginning to have some personality changes. Much more irritable and quick tempered. More impulsive. He agrees to imaging and referral to neurology for further evaluation. 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 CT scan is also ordered

## 2021-05-01 ENCOUNTER — Other Ambulatory Visit: Payer: Self-pay | Admitting: Family Medicine

## 2021-05-01 ENCOUNTER — Other Ambulatory Visit: Payer: Self-pay | Admitting: Interventional Cardiology

## 2021-05-01 ENCOUNTER — Other Ambulatory Visit (HOSPITAL_BASED_OUTPATIENT_CLINIC_OR_DEPARTMENT_OTHER): Payer: Self-pay

## 2021-05-01 MED ORDER — SERTRALINE HCL 100 MG PO TABS
ORAL_TABLET | Freq: Two times a day (BID) | ORAL | 1 refills | Status: DC
  Filled 2021-05-01: qty 180, 90d supply, fill #0
  Filled 2021-08-19: qty 180, 90d supply, fill #1

## 2021-05-01 MED ORDER — AMLODIPINE BESYLATE 5 MG PO TABS
ORAL_TABLET | Freq: Every day | ORAL | 2 refills | Status: DC
  Filled 2021-05-01: qty 90, 90d supply, fill #0
  Filled 2021-09-07: qty 90, 90d supply, fill #1
  Filled 2022-01-06: qty 90, 90d supply, fill #2

## 2021-05-01 MED FILL — Apixaban Tab 5 MG: ORAL | 30 days supply | Qty: 60 | Fill #1 | Status: AC

## 2021-05-01 MED FILL — Ranolazine Tab ER 12HR 500 MG: ORAL | 30 days supply | Qty: 60 | Fill #2 | Status: AC

## 2021-05-05 ENCOUNTER — Telehealth: Payer: Self-pay | Admitting: Family Medicine

## 2021-05-05 ENCOUNTER — Encounter: Payer: Self-pay | Admitting: Family Medicine

## 2021-05-05 ENCOUNTER — Other Ambulatory Visit (HOSPITAL_BASED_OUTPATIENT_CLINIC_OR_DEPARTMENT_OTHER): Payer: Self-pay

## 2021-05-05 NOTE — Telephone Encounter (Signed)
The patient states his surgery was cancelled due to his A1C being too high. The surgeon requires a recheck of his A1C in order to reschedule the appointment. Please advise

## 2021-05-05 NOTE — Telephone Encounter (Signed)
Usually re only redo it every 90 days. So July 20, could run a couple of weeks early this next time but to get it down he should be checking his sugars twice a day and letting us know his sugars so we can adjust meds and control his sugar tighter and/or he can start seeing endocrinology again.

## 2021-05-05 NOTE — Telephone Encounter (Signed)
Mychart was sent to patient

## 2021-05-05 NOTE — Telephone Encounter (Signed)
Last A1C was 8.9 on 03/25/21.  When should he recheck?

## 2021-05-06 ENCOUNTER — Other Ambulatory Visit (HOSPITAL_BASED_OUTPATIENT_CLINIC_OR_DEPARTMENT_OTHER): Payer: Self-pay

## 2021-05-08 ENCOUNTER — Ambulatory Visit: Payer: Medicare PPO | Attending: Internal Medicine

## 2021-05-08 ENCOUNTER — Ambulatory Visit (HOSPITAL_BASED_OUTPATIENT_CLINIC_OR_DEPARTMENT_OTHER)
Admission: RE | Admit: 2021-05-08 | Discharge: 2021-05-08 | Disposition: A | Discharge status: Home / Self Care | Payer: Medicare PPO | Source: Ambulatory Visit | Attending: Family Medicine | Admitting: Family Medicine

## 2021-05-08 ENCOUNTER — Other Ambulatory Visit: Payer: Self-pay

## 2021-05-08 DIAGNOSIS — F32A Depression, unspecified: Secondary | ICD-10-CM | POA: Diagnosis not present

## 2021-05-08 DIAGNOSIS — G3184 Mild cognitive impairment, so stated: Secondary | ICD-10-CM | POA: Diagnosis not present

## 2021-05-08 DIAGNOSIS — R4182 Altered mental status, unspecified: Secondary | ICD-10-CM | POA: Diagnosis not present

## 2021-05-08 DIAGNOSIS — F688 Other specified disorders of adult personality and behavior: Secondary | ICD-10-CM | POA: Diagnosis not present

## 2021-05-08 DIAGNOSIS — R519 Headache, unspecified: Secondary | ICD-10-CM | POA: Diagnosis not present

## 2021-05-08 DIAGNOSIS — Z23 Encounter for immunization: Secondary | ICD-10-CM

## 2021-05-08 NOTE — Progress Notes (Signed)
   Covid-19 Vaccination Clinic  Name:  [name] [name]    MRN: [id number] DOB: [birth year]  05/08/2021  [name] [name] was observed post Covid-19 immunization for 15 minutes without incident. He was provided with Vaccine Information Sheet and instruction to access the V-Safe system.   [name] [name] was instructed to call 911 with any severe reactions post vaccine: Marland Kitchen Difficulty breathing  . Swelling of face and throat  . A fast heartbeat  . A bad rash all over body  . Dizziness and weakness   Immunizations Administered    Name Date Dose VIS Date Route   PFIZER Comrnaty(Gray TOP) Covid-19 Vaccine 05/08/2021  9:00 AM 0.3 mL 11/13/2020 Intramuscular   Manufacturer: [employer]   Lot: [id number]   [place]: [phone]

## 2021-05-09 ENCOUNTER — Other Ambulatory Visit: Payer: Self-pay

## 2021-05-09 ENCOUNTER — Encounter (HOSPITAL_COMMUNITY): Payer: Self-pay

## 2021-05-09 ENCOUNTER — Emergency Department (HOSPITAL_COMMUNITY)
Admission: EM | Admit: 2021-05-09 | Discharge: 2021-05-09 | Disposition: A | Discharge status: Home / Self Care | Payer: Medicare PPO | Attending: Emergency Medicine | Admitting: Emergency Medicine

## 2021-05-09 DIAGNOSIS — E162 Hypoglycemia, unspecified: Secondary | ICD-10-CM

## 2021-05-09 DIAGNOSIS — I130 Hypertensive heart and chronic kidney disease with heart failure and stage 1 through stage 4 chronic kidney disease, or unspecified chronic kidney disease: Secondary | ICD-10-CM | POA: Insufficient documentation

## 2021-05-09 DIAGNOSIS — E1122 Type 2 diabetes mellitus with diabetic chronic kidney disease: Secondary | ICD-10-CM | POA: Diagnosis not present

## 2021-05-09 DIAGNOSIS — I10 Essential (primary) hypertension: Secondary | ICD-10-CM | POA: Diagnosis not present

## 2021-05-09 DIAGNOSIS — N184 Chronic kidney disease, stage 4 (severe): Secondary | ICD-10-CM | POA: Insufficient documentation

## 2021-05-09 DIAGNOSIS — E11649 Type 2 diabetes mellitus with hypoglycemia without coma: Secondary | ICD-10-CM | POA: Insufficient documentation

## 2021-05-09 DIAGNOSIS — R531 Weakness: Secondary | ICD-10-CM | POA: Diagnosis not present

## 2021-05-09 DIAGNOSIS — I5032 Chronic diastolic (congestive) heart failure: Secondary | ICD-10-CM | POA: Insufficient documentation

## 2021-05-09 DIAGNOSIS — Z79899 Other long term (current) drug therapy: Secondary | ICD-10-CM | POA: Diagnosis not present

## 2021-05-09 DIAGNOSIS — Z951 Presence of aortocoronary bypass graft: Secondary | ICD-10-CM | POA: Insufficient documentation

## 2021-05-09 DIAGNOSIS — Z7901 Long term (current) use of anticoagulants: Secondary | ICD-10-CM | POA: Insufficient documentation

## 2021-05-09 DIAGNOSIS — Z794 Long term (current) use of insulin: Secondary | ICD-10-CM | POA: Diagnosis not present

## 2021-05-09 DIAGNOSIS — I2510 Atherosclerotic heart disease of native coronary artery without angina pectoris: Secondary | ICD-10-CM | POA: Insufficient documentation

## 2021-05-09 DIAGNOSIS — E039 Hypothyroidism, unspecified: Secondary | ICD-10-CM | POA: Insufficient documentation

## 2021-05-09 LAB — CBC WITH DIFFERENTIAL/PLATELET
Abs Immature Granulocytes: 0.03 10*3/uL (ref 0.00–0.07)
Basophils Absolute: 0 10*3/uL (ref 0.0–0.1)
Basophils Relative: 0 %
Eosinophils Absolute: 0.1 10*3/uL (ref 0.0–0.5)
Eosinophils Relative: 1 %
HCT: 39.9 % (ref 39.0–52.0)
Hemoglobin: 13.3 g/dL (ref 13.0–17.0)
Immature Granulocytes: 0 %
Lymphocytes Relative: 7 %
Lymphs Abs: 0.5 10*3/uL — ABNORMAL LOW (ref 0.7–4.0)
MCH: 30.6 pg (ref 26.0–34.0)
MCHC: 33.3 g/dL (ref 30.0–36.0)
MCV: 91.7 fL (ref 80.0–100.0)
Monocytes Absolute: 0.7 10*3/uL (ref 0.1–1.0)
Monocytes Relative: 9 %
Neutro Abs: 6.9 10*3/uL (ref 1.7–7.7)
Neutrophils Relative %: 83 %
Platelets: 187 10*3/uL (ref 150–400)
RBC: 4.35 MIL/uL (ref 4.22–5.81)
RDW: 14 % (ref 11.5–15.5)
WBC: 8.3 10*3/uL (ref 4.0–10.5)
nRBC: 0 % (ref 0.0–0.2)

## 2021-05-09 LAB — COMPREHENSIVE METABOLIC PANEL
ALT: 17 U/L (ref 0–44)
AST: 25 U/L (ref 15–41)
Albumin: 4.1 g/dL (ref 3.5–5.0)
Alkaline Phosphatase: 46 U/L (ref 38–126)
Anion gap: 7 (ref 5–15)
BUN: 24 mg/dL — ABNORMAL HIGH (ref 8–23)
CO2: 25 mmol/L (ref 22–32)
Calcium: 9.3 mg/dL (ref 8.9–10.3)
Chloride: 107 mmol/L (ref 98–111)
Creatinine, Ser: 2.16 mg/dL — ABNORMAL HIGH (ref 0.61–1.24)
GFR, Estimated: 32 mL/min — ABNORMAL LOW (ref >60)
Glucose, Bld: 41 mg/dL — CL (ref 70–99)
Potassium: 3.8 mmol/L (ref 3.5–5.1)
Sodium: 139 mmol/L (ref 135–145)
Total Bilirubin: 0.7 mg/dL (ref 0.3–1.2)
Total Protein: 7.2 g/dL (ref 6.5–8.1)

## 2021-05-09 LAB — URINALYSIS, ROUTINE W REFLEX MICROSCOPIC
Bilirubin Urine: NEGATIVE
Glucose, UA: 150 mg/dL — AB
Hgb urine dipstick: NEGATIVE
Ketones, ur: NEGATIVE mg/dL
Leukocytes,Ua: NEGATIVE
Nitrite: NEGATIVE
Protein, ur: NEGATIVE mg/dL
Specific Gravity, Urine: 1.013 (ref 1.005–1.030)
pH: 7 (ref 5.0–8.0)

## 2021-05-09 LAB — CBG MONITORING, ED
Glucose-Capillary: 116 mg/dL — ABNORMAL HIGH (ref 70–99)
Glucose-Capillary: 41 mg/dL — CL (ref 70–99)
Glucose-Capillary: 46 mg/dL — ABNORMAL LOW (ref 70–99)
Glucose-Capillary: 55 mg/dL — ABNORMAL LOW (ref 70–99)
Glucose-Capillary: 182 mg/dL — ABNORMAL HIGH (ref 70–99)
Glucose-Capillary: 204 mg/dL — ABNORMAL HIGH (ref 70–99)
Glucose-Capillary: 170 mg/dL — ABNORMAL HIGH (ref 70–99)

## 2021-05-09 LAB — TROPONIN I (HIGH SENSITIVITY)
Troponin I (High Sensitivity): 9 ng/L (ref <18)
Troponin I (High Sensitivity): 10 ng/L (ref <18)

## 2021-05-09 MED ORDER — SODIUM CHLORIDE 0.9 % IV BOLUS
1000.0000 mL | Freq: Once | INTRAVENOUS | Status: AC
Start: 2021-05-09 — End: 2021-05-09
  Administered 2021-05-09: 1000 mL via INTRAVENOUS

## 2021-05-09 MED ORDER — DEXTROSE 50 % IV SOLN: 1.0000 | Freq: Once | INTRAVENOUS | Status: AC

## 2021-05-09 MED ORDER — DEXTROSE 50 % IV SOLN
INTRAVENOUS | Status: AC
  Administered 2021-05-09: 50 mL
  Filled 2021-05-09: qty 50

## 2021-05-09 MED ORDER — DEXTROSE 50 % IV SOLN
INTRAVENOUS | Status: AC
  Administered 2021-05-09: 50 mL via INTRAVENOUS
  Filled 2021-05-09: qty 50

## 2021-05-09 NOTE — ED Provider Notes (Signed)
[facility] EMERGENCY DEPARTMENT Provider Note   CSN: [id number] Arrival date & time: 05/09/21  9826     History Chief Complaint  Patient presents with  . Weakness  . Hypoglycemia    [name] [name] is a 75 y.o. male possible history of CAD, CKD, diabetes, hypertension brought in by EMS for evaluation of generalized weakness.  Patient reports that he remembers waking up today and he just felt like he was cold and felt weak and tired.  He states he does not remember much else.  Per EMS, family noted that patient was diaphoretic, pale.  They checked his blood sugar and it was 44.  They called EMS and when they got there, blood sugar was 91.  Patient was drowsy.  Patient states that he is currently on insulin and trijardy.  He denies any recent changes to his medication.  He has not taken extra.  He has not any nausea/vomiting recently.  He denies any fevers, cough, chest pain, difficulty breathing, abdominal pain, nausea/vomiting.  The history is provided by the patient.       Past Medical History:  Diagnosis Date  . Acquired atrophy of thyroid 06/05/2013   Overview:  July 2014: controlled on synthroid 14mcg since 2012 May 2015: decreased to Synthroid 50 mcg  Last Assessment & Plan:  Pt doing well with most recent TSH within normal range.  Will continue current dosage of Synthroid 63mcg. Pt reminded to take medication on empty stomach. Will continue to monitor with periodic laboratory assessment in 3 months.   . Anemia    hemoglobin 7.4, iron deficiency, January, 2011, 2 unit transfusion, endoscopy normal, capsule endoscopy February, 2011 no small bowel abnormalities.   Most likely source gastric erosions, followed by GI  . Anxiety   . Bradycardia   . CAD (coronary artery disease)    A. CABG in 2000,status post cardiac cath in 2006, 2009 ....continued chest pain and SOB despite oral medication adjestments including Ranexa. B. Cath November 2009/ mRCA - 2.75 x 23 Abbott  Xience V drug-eluting stent ...11/26/2008 to distal  RCA leading to acute marginal.  C. Cath 07/2012 for CP - stable anatomy, med rx. d. cath 2015 and 05/30/2015 stable anatomy, consider Myoview if has CP again  . Carotid artery disease ([place]) 08/01/2015   Doppler, May 29, 2015, 1-39% bilateral ICA   . Cerebral ischemia    MRI November, 2010, chronic microvascular ischemia  . CKD (chronic kidney disease), stage III ([place])   . Concussion   . Depression    Bipolar  . Diabetes mellitus ([place])   . Edema   . Essential hypertension 06/05/2013   Overview:  July 2014: Controlled with Bystolic Sep 4158: Changed to atenolol.  Last Assessment & Plan:  Will change to atenolol for cost and follow Improved.  Medication compliance strongly encouraged BP: 116/64 mmHg    Overview:  July 2014: Controlled with Bystolic Sep 3094: Changed to atenolol.  Last Assessment & Plan:  Will change to atenolol for cost and follow  . Falling episodes    these have occurred in the past and again recurring 2011  . Family history of adverse reaction to anesthesia    "mother died during bypass surgery but not sure if it has to do with anesthesia"  . Gastric ulcer   . GERD (gastroesophageal reflux disease)   . H/O medication noncompliance    Due to loss of insurance  . H/O multiple concussions   . Hard of hearing   .  History of blood transfusion 12/20/2013  . Hx of CABG    2000,  / one median sternotomy suture broken her chest x-ray November, 2010, no clinical significance  . Hyperlipidemia   . Hypertension    pt. denies  . Iron deficiency anemia   . Long term (current) use of anticoagulants [Z79.01] 07/20/2016  . Low back pain 06/12/2009   Qualifier: Diagnosis of  By: [name]   . Nephrolithiasis   . Orthostasis   . OSA (obstructive sleep apnea)   . PAF (paroxysmal atrial fibrillation) ([place])    a. dx 2017, started on amiodarone/warfarin but patient intermittently noncompliant.  Marland Kitchen PSVT (paroxysmal supraventricular  tachycardia) ([place])   . RBBB 07/09/2009   Qualifier: Diagnosis of  By: [name], MD, [name] [name]   . RLS (restless legs syndrome) 09/19/2009   Qualifier: Diagnosis of  By: [name]   Overview:  July 2014: Controlled with Mirapex  Last Assessment & Plan:  Patient is doing well. Will continue current management and follow clinically.  . Seizure disorder ([place]) 01/09/2017  . Spondylosis    C5-6, C6-7 MRI 2010  . Syncope 03/2016  . Thyroid disease   . Tubulovillous adenoma of colon 2007  . Vitamin D deficiency 05/17/2017  . Wears glasses     Patient Active Problem List   Diagnosis Date Noted  . Sprain of anterior talofibular ligament of left ankle 01/08/2021  . Achilles tendon pain 06/06/2020  . Thoracic back pain 05/11/2020  . CKD (chronic kidney disease), stage IV ([place])   . Left ankle pain 12/30/2019  . Frequency of urination 05/13/2019  . Melena 04/09/2019  . Obesity 02/05/2019  . Neck pain 02/05/2019  . MCI (mild cognitive impairment) 02/05/2019  . Fall at home, initial encounter 11/06/2018  . Localized swelling of both lower extremities 07/13/2018  . Cervical radiculopathy at C8 02/08/2018  . Hypersomnolence 10/18/2017  . Vitamin D deficiency 05/17/2017  . Seizure disorder ([place]) 01/09/2017  . Atrial fibrillation ([place]) [I48.91] 07/20/2016  . Long term (current) use of anticoagulants [Z79.01] 07/20/2016  . Foraminal stenosis of cervical region 05/31/2016  . DOE (dyspnea on exertion) 05/14/2016  . Chronic diastolic CHF (congestive heart failure) ([place]) 03/13/2016  . Headache 01/25/2016  . Carotid artery disease ([place]) 08/01/2015  . Spinal stenosis in cervical region 07/30/2015  . Occipital neuralgia of right side 07/15/2015  . Cervicogenic headache 07/15/2015  . Dizziness   . Chest pain 05/29/2015  . Medicare annual wellness visit, initial 05/11/2015  . Sun-damaged skin 05/11/2015  . Advance care planning 05/01/2015  . Post-traumatic headache 02/18/2015  . OA  (osteoarthritis) of knee 01/07/2015  . Recurrent pain of right knee 05/10/2014  . Personal history of ongoing treatment with alendronate (Fosamax) 12/20/2013  . History of blood transfusion 12/20/2013  . Hyperlipidemia LDL goal <70 06/27/2013  . HLD (hyperlipidemia) 06/27/2013  . Essential hypertension 06/05/2013  . Arthritis of hand, degenerative 06/05/2013  . Acquired atrophy of thyroid 06/05/2013  . Clinical depression 06/05/2013  . Hypothyroidism due to acquired atrophy of thyroid 06/05/2013  . Osteoarthritis of hand 06/05/2013  . Arteriosclerosis of coronary artery 05/01/2013  . Benign prostatic hyperplasia with urinary obstruction 05/01/2013  . BPH with obstruction/lower urinary tract symptoms 05/01/2013  . CAD (coronary artery disease) 05/01/2013  . Abdominal pain, unspecified site 03/06/2013  . Right shoulder pain 10/24/2012  . Eczema 10/24/2012  . Ejection fraction   . Diabetes mellitus type II, uncontrolled ([place]) 08/31/2011  . Coronary artery  disease involving native coronary artery   . Anemia   . OSA (obstructive sleep apnea)   . Edema   . Falling episodes   . Hx of CABG   . Palpitations   . Shortness of breath   . Insomnia 04/26/2011  . RLS (restless legs syndrome) 09/19/2009  . Depression 07/09/2009  . RBBB 07/09/2009  . Low back pain potentially associated with radiculopathy 06/12/2009  . SCIATICA 01/17/2009  . GERD (gastroesophageal reflux disease) 01/09/2009  . TUBULOVILLOUS ADENOMA, COLON, HX OF 01/08/2009  . Presence of drug coated stent in right coronary artery 10/29/2008  . NEPHROLITHIASIS, HX OF 12/20/2007    Past Surgical History:  Procedure Laterality Date  . ANTERIOR CERVICAL DECOMP/DISCECTOMY FUSION N/A 05/31/2016   Procedure: ANTERIOR CERVICAL DECOMPRESSION/DISCECTOMY FUSION CERVICAL FIVE-SIX,CERVICAL SIX-SEVEN;  Surgeon: [name], MD;  Location: [facility];  Service: Neurosurgery;  Laterality: N/A;  . CARDIAC CATHETERIZATION N/A 05/30/2015    Procedure: Left Heart Cath and Coronary Angiography;  Surgeon: [name], MD;  Location: [facility];  Service: Cardiovascular;  Laterality: N/A;  . COLONOSCOPY    . CORONARY ARTERY BYPASS GRAFT     2000  . ESOPHAGOGASTRODUODENOSCOPY    . LEFT HEART CATH AND CORS/GRAFTS ANGIOGRAPHY N/A 12/15/2017   Procedure: LEFT HEART CATH AND CORS/GRAFTS ANGIOGRAPHY;  Surgeon: [name], MD;  Location: [facility];  Service: Cardiovascular;  Laterality: N/A;  . LEFT HEART CATHETERIZATION WITH CORONARY/GRAFT ANGIOGRAM N/A 08/01/2012   Procedure: LEFT HEART CATHETERIZATION WITH Beatrix Fetters;  Surgeon: [name], MD;  Location: [facility] CATH LAB;  Service: Cardiovascular;  Laterality: N/A;  . LEFT HEART CATHETERIZATION WITH CORONARY/GRAFT ANGIOGRAM N/A 01/03/2015   Procedure: LEFT HEART CATHETERIZATION WITH Beatrix Fetters;  Surgeon: [name], MD;  Location: [facility] CATH LAB;  Service: Cardiovascular;  Laterality: N/A;  . NASAL SEPTUM SURGERY     UP3  . PERCUTANEOUS CORONARY STENT INTERVENTION (PCI-S)  10/2008   mRCA PCI  2.75 x 23 Abbott Xience V drug-eluting stent   . RIGHT/LEFT HEART CATH AND CORONARY/GRAFT ANGIOGRAPHY N/A 01/07/2020   Procedure: RIGHT/LEFT HEART CATH AND CORONARY/GRAFT ANGIOGRAPHY;  Surgeon: [name], MD;  Location: [place] [facility] CV LAB;  Service: Cardiovascular;  Laterality: N/A;  . ULTRASOUND GUIDANCE FOR VASCULAR ACCESS  12/15/2017   Procedure: Ultrasound Guidance For Vascular Access;  Surgeon: [name], MD;  Location: [facility] CV LAB;  Service: Cardiovascular;;       Family History  Problem Relation Age of Onset  . Pancreatic cancer Brother   . Diabetes Brother   . Coronary artery disease Brother   . Stroke Brother   . Diabetes Brother   . Diabetes Mother   . Heart failure Mother   . Heart failure Father   . Hypothyroidism Brother   . Coronary artery disease Brother   . Other Brother        colon surgery   . Heart attack Other        Nephew  . Irregular heart beat Daughter   . Cancer Maternal Grandmother        unknown   . Colon cancer Neg Hx   . Stomach cancer Neg Hx   . Liver cancer Neg Hx   . Rectal cancer Neg Hx   . Esophageal cancer Neg Hx     Social History   Tobacco Use  . Smoking status: Never Smoker  . Smokeless tobacco: Never Used  Vaping Use  . Vaping Use:  Never used  Substance Use Topics  . Alcohol use: No    Alcohol/week: 0.0 standard drinks    Comment: stopped drinking in 1998  . Drug use: No    Home Medications Prior to Admission medications   Medication Sig Start Date End Date Taking? Authorizing Provider  albuterol (VENTOLIN HFA) 108 (90 Base) MCG/ACT inhaler Inhale 2 puffs into the lungs every 6 (six) hours as needed for wheezing or shortness of breath. 06/25/19   [name], MD  amLODipine (NORVASC) 5 MG tablet Take 1 tablet (5 mg total) by mouth at bedtime. 11/20/20   [name], MD  amLODipine (NORVASC) 5 MG tablet TAKE 1 TABLET BY MOUTH ONCE DAILY 05/01/21 05/01/22  [name], MD  apixaban (ELIQUIS) 5 MG TABS tablet TAKE 1 TABLET BY MOUTH TWICE DAILY Patient taking differently: Take 5 mg by mouth 2 (two) times daily. 11/27/20 11/27/21  [name], MD  atorvastatin (LIPITOR) 80 MG tablet Take 1 tablet (80 mg total) by mouth daily. Patient taking differently: Take 80 mg by mouth at bedtime. 03/09/21   [name], MD  busPIRone (BUSPAR) 7.5 MG tablet Take 1 tablet (7.5 mg total) by mouth 2 (two) times daily. 04/28/21   [name], MD  cetirizine (ZYRTEC) 10 MG tablet TAKE 1 TABLET (10 MG TOTAL) BY MOUTH DAILY. Patient taking differently: Take 10 mg by mouth daily. 03/03/21 03/03/22  [name], MD  Continuous Blood Gluc Receiver (FREESTYLE LIBRE 14 DAY READER) DEVI For continuous Blood Glucose monitoring 06/23/20   Renato Shin, MD  Continuous Blood Gluc Sensor (FREESTYLE LIBRE 14 DAY SENSOR) MISC USE TO CHECK BLOOD  SUGAR AND CHANGE EVERY 14 DAYS 02/25/21 02/25/22  Renato Shin, MD  Diclofenac Sodium (PENNSAID) 2 % SOLN Place 1 application onto the skin 2 (two) times daily. 10/17/20   Rosemarie Ax, MD  famotidine (PEPCID) 20 MG tablet TAKE 1 TABLET BY MOUTH 2 TIMES DAILY Patient taking differently: Take 40 mg by mouth daily. 03/03/21 03/03/22  [name], MD  fenofibrate (TRICOR) 145 MG tablet TAKE 1 TABLET (145 MG TOTAL) BY MOUTH DAILY. Patient taking differently: Take 145 mg by mouth daily. 11/27/20 11/27/21  [name], MD  furosemide (LASIX) 20 MG tablet TAKE 1 TABLET BY MOUTH DAILY AS NEEDED FOR EDEMA Patient taking differently: Take 20 mg by mouth daily as needed for edema. 01/28/21 01/28/22  [name], MD  glucose blood (ACCU-CHEK AVIVA) test strip Use as directed to check blood sugar 4 times daily.  DX E11.9 02/05/19   [name], MD  HYDROcodone-acetaminophen (NORCO/VICODIN) 5-325 MG tablet TAKE 1 TABLET BY MOUTH EVERY 8 (EIGHT) HOURS AS NEEDED. Patient taking differently: Take 1 tablet by mouth every 6 (six) hours as needed for severe pain. 02/25/21 08/24/21  Rosemarie Ax, MD  influenza vaccine adjuvanted (FLUAD) 0.5 ML injection TAKE AS DIRECTED 09/12/20 09/12/21  Carlyle Basques, MD  insulin glargine, 2 Unit Dial, (TOUJEO MAX SOLOSTAR) 300 UNIT/ML Solostar Pen Inject 140 Units into the skin every morning. 04/16/21   Renato Shin, MD  Insulin Pen Needle 31G X 8 MM MISC USE TO CHECK BLOOD SUGAR THREE TIME DAILY 11/27/20 11/27/21  Renato Shin, MD  Insulin Pen Needle 31G X 8 MM MISC use to inject insulin twice a day 04/03/21   Renato Shin, MD  Insulin Syringe-Needle U-100 31G X 5/16" 1 ML MISC USE AS DIRECTED WITH NOVOLIN 02/24/21 02/24/22  Renato Shin, MD  Insulin  Syringe-Needle U-100 31G X 5/16" 1 ML MISC USE AS DIRECTED WITH NOVOLIN 02/24/21 02/24/22  Renato Shin, MD  isosorbide mononitrate (IMDUR) 120 MG 24 hr tablet TAKE 1 TABLET (120 MG TOTAL) BY MOUTH DAILY. Patient taking  differently: Take 120 mg by mouth daily. 10/23/20 10/23/21  [name], MD  levothyroxine (SYNTHROID) 100 MCG tablet TAKE 1 TABLET (100 MCG TOTAL) BY MOUTH DAILY. Patient taking differently: Take 100 mcg by mouth daily. 01/13/21 01/13/22  [name], MD  metoprolol succinate (TOPROL-XL) 50 MG 24 hr tablet TAKE 1 AND 1/2 TABLETS EVERY DAY FOR A TOTAL OF 75 MG Patient taking differently: Take 75 mg by mouth daily. 11/20/20   [name], MD  nitroGLYCERIN (NITROSTAT) 0.4 MG SL tablet PLACE 1 TABLET UNDER THE TONGUE EVERY 5 MINUTES AS NEEDED FOR CHEST PAIN. Patient taking differently: Place 0.4 mg under the tongue every 5 (five) minutes as needed for chest pain. 05/20/20   [name], MD  pantoprazole (PROTONIX) 40 MG tablet TAKE 1 TABLET EVERY DAY 04/16/21   [name], MD  pramipexole (MIRAPEX) 1 MG tablet TAKE 1/2 TO 1 TABLET AT BEDTIME 04/21/21   [name], MD  ranolazine (RANEXA) 500 MG 12 hr tablet TAKE 1 TABLET BY MOUTH TWICE DAILY Patient taking differently: Take 500 mg by mouth 2 (two) times daily. 03/02/21 03/02/22  [name], MD  sertraline (ZOLOFT) 100 MG tablet TAKE 1 TABLET BY MOUTH TWICE DAILY 05/01/21 05/01/22  [name], MD  tiZANidine (ZANAFLEX) 4 MG tablet TAKE 1 TABLET BY MOUTH EVERY 6 HOURS AS NEEDED FOR MUSCLE SPASMS 04/29/21 04/29/22  [name], MD  triamcinolone cream (KENALOG) 0.1 % Apply 1 application topically 2 (two) times daily. Use as needed for itchy rash 03/18/21   Copland, Gay Filler, MD  Vitamin D, Ergocalciferol, (DRISDOL) 1.25 MG (50000 UNIT) CAPS capsule TAKE 1 CAPSULE BY MOUTH EVERY 7 DAYS Patient taking differently: Take 50,000 Units by mouth every 7 (seven) days. 10/23/20 10/23/21  [name], MD    Allergies    Morphine and Shellfish-derived products  Review of Systems   Review of Systems  Constitutional: Positive for fatigue. Negative for fever.  Respiratory: Negative for cough and shortness of breath.    Cardiovascular: Negative for chest pain.  Gastrointestinal: Negative for abdominal pain, nausea and vomiting.  Genitourinary: Negative for dysuria and hematuria.  Neurological: Positive for weakness. Negative for headaches.  All other systems reviewed and are negative.   Physical Exam Updated Vital Signs BP 130/65   Pulse (!) 55   Temp 97.8 F (36.6 C) (Rectal)   Resp 11   SpO2 97%   Physical Exam Vitals and nursing note reviewed.  Constitutional:      Appearance: Normal appearance. He is well-developed.  HENT:     Head: Normocephalic and atraumatic.     Comments: No tenderness to palpation of skull. No deformities or crepitus noted. No open wounds, abrasions or lacerations.  Eyes:     General: Lids are normal.     Conjunctiva/sclera: Conjunctivae normal.     Pupils: Pupils are equal, round, and reactive to light.  Cardiovascular:     Rate and Rhythm: Normal rate and regular rhythm.     Pulses: Normal pulses.     Heart sounds: Normal heart sounds. No murmur heard. No friction rub. No gallop.   Pulmonary:     Effort: Pulmonary effort is normal.     Breath sounds: Normal  breath sounds.     Comments: Lungs clear to auscultation bilaterally.  Symmetric chest rise.  No wheezing, rales, rhonchi. Abdominal:     Palpations: Abdomen is soft. Abdomen is not rigid.     Tenderness: There is no abdominal tenderness. There is no guarding.     Comments: Abdomen is soft, non-distended, non-tender. No rigidity, No guarding. No peritoneal signs.  Musculoskeletal:        General: Normal range of motion.     Cervical back: Full passive range of motion without pain.  Skin:    General: Skin is warm and dry.     Capillary Refill: Capillary refill takes less than 2 seconds.  Neurological:     Mental Status: He is alert and oriented to person, place, and time.     Comments: Cranial nerves III-XII intact Follows commands, Moves all extremities  5/5 strength to BUE and BLE  Sensation intact  throughout all major nerve distributions No gait abnormalities  No slurred speech. No facial droop.   Psychiatric:        Speech: Speech normal.     ED Results / Procedures / Treatments   Labs (all labs ordered are listed, but only abnormal results are displayed) Labs Reviewed  COMPREHENSIVE METABOLIC PANEL - Abnormal; Notable for the following components:      Result Value   Glucose, Bld 41 (*)    BUN 24 (*)    Creatinine, Ser 2.16 (*)    GFR, Estimated 32 (*)    All other components within normal limits  CBC WITH DIFFERENTIAL/PLATELET - Abnormal; Notable for the following components:   Lymphs Abs 0.5 (*)    All other components within normal limits  URINALYSIS, ROUTINE W REFLEX MICROSCOPIC - Abnormal; Notable for the following components:   Glucose, UA 150 (*)    All other components within normal limits  CBG MONITORING, ED - Abnormal; Notable for the following components:   Glucose-Capillary 41 (*)    All other components within normal limits  CBG MONITORING, ED - Abnormal; Notable for the following components:   Glucose-Capillary 116 (*)    All other components within normal limits  CBG MONITORING, ED - Abnormal; Notable for the following components:   Glucose-Capillary 55 (*)    All other components within normal limits  CBG MONITORING, ED - Abnormal; Notable for the following components:   Glucose-Capillary 46 (*)    All other components within normal limits  CBG MONITORING, ED - Abnormal; Notable for the following components:   Glucose-Capillary 182 (*)    All other components within normal limits  CBG MONITORING, ED - Abnormal; Notable for the following components:   Glucose-Capillary 204 (*)    All other components within normal limits  CBG MONITORING, ED - Abnormal; Notable for the following components:   Glucose-Capillary 170 (*)    All other components within normal limits  TROPONIN I (HIGH SENSITIVITY)  TROPONIN I (HIGH SENSITIVITY)    EKG EKG  Interpretation  Date/Time:  Saturday May 09 2021 09:58:36 EDT Ventricular Rate:  56 PR Interval:  227 QRS Duration: 132 QT Interval:  463 QTC Calculation: 447 R Axis:   46 Text Interpretation: Sinus rhythm Prolonged PR interval Right bundle branch block No significant change since last tracing Confirmed by Theotis Burrow [phone]) on 05/09/2021 12:00:39 PM   Radiology CT Head Wo Contrast  Result Date: 05/08/2021 CLINICAL DATA:  Mental status change. Headache. Cognitive impairment. EXAM: CT HEAD WITHOUT CONTRAST TECHNIQUE: Contiguous axial images were  obtained from the base of the skull through the vertex without intravenous contrast. COMPARISON:  08/31/2017 FINDINGS: Brain: Generalized atrophy. Negative for hydrocephalus. Mild patchy white matter hypodensity bilaterally unchanged from prior studies. Hypodensity left pons not definitely seen on prior studies but likely due to chronic ischemia. Negative for acute infarct, hemorrhage, mass Vascular: Negative for hyperdense vessel Skull: Negative Sinuses/Orbits: Mild mucosal edema paranasal sinuses. Negative orbit. Other: Periapical lucencies around left upper molars. IMPRESSION: Atrophy and mild chronic microvascular ischemic change. Hypodensity left pons likely due to chronic ischemia. No acute abnormality. Electronically Signed   By: Franchot Gallo [name]   On: 05/08/2021 08:52    Procedures Procedures   Medications Ordered in ED Medications  dextrose 50 % solution (50 mLs  Given 05/09/21 0946)  sodium chloride 0.9 % bolus 1,000 mL (0 mLs Intravenous Stopped 05/09/21 1133)  dextrose 50 % solution 50 mL (50 mLs Intravenous Given 05/09/21 1224)    ED Course  I have reviewed the triage vital signs and the nursing notes.  Pertinent labs & imaging results that were available during my care of the patient were reviewed by me and considered in my medical decision making (see chart for details).    MDM Rules/Calculators/A&P                           75 year old male brought in for altered mental status.  Per family, patient was pale diaphoretic earlier today was altered.  They took blood sugar and 41.  On EMS arrival, is 26.  Patient reports he felt weak, tired.  Initially arrival, he is afebrile, slightly drowsy.  Blood sugar was 44.  Patient given D50.  Plan to check labs.  On my evaluation, he is alert and oriented x3, able to speak with any difficulty.  No neurodeficits noted on exam.  I suspect his altered mental status was likely related to hypoglycemia.  Do not suspect CVA, intracranial hemorrhage. We will check labs for infectious process, dehydration.  Wife is at bedside who provides more history.  She reports that today, he got up and was saying how cold he was.  She reports that she checked his blood sugar at home and it was 41.  She tried to give him a glucose tablet but he was not able to take it.  EMS was called.  They did not give him anything as his blood sugar was then 91.  CBC shows no leukocytosis.  Hemoglobin stable.  Glucose now 116 after D50.  Trope negative.  CMP shows BUN of 24, creatinine 2.16.  This is slightly above his baseline.  I was notified by RN that patient's repeat blood glucose was 43.  Patient given another amp of D50 and encouraged to eat.  Patient had repeat CBG showed 55.  He was given more food.  Repeat CBG was 46.  Patient given additional amp of D50.  We will plan to recheck.  Repeat CBG is 182.  Repeat CBG was 204.   Repeat CBG is 170.   Patient with 3 normal blood sugars.  He has been eating without any difficulty.  Discussed results with patient.  Patient is well-appearing, alert and oriented x3.  He is able to ambulate with any difficulty.  I discussed with him regarding his insulin use.  Patient instructed to follow-up with his primary care doctor. At this time, patient exhibits no emergent life-threatening condition that require further evaluation in ED. Patient had ample opportunity  for  questions and discussion. All patient's questions were answered with full understanding. Strict return precautions discussed. Patient expresses understanding and agreement to plan. Patient had ample opportunity for questions and discussion. All patient's questions were answered with full understanding.   Portions of this note were generated with [employer]. Dictation errors may occur despite best attempts at proofreading.   Final Clinical Impression(s) / ED Diagnoses Final diagnoses:  Hypoglycemia    Rx / DC Orders ED Discharge Orders    None       Desma Mcgregor 05/09/21 1532    Little, Wenda Overland, MD 05/10/21 [phone]

## 2021-05-09 NOTE — ED Notes (Signed)
Pt given [place] sandwhich bag and drink.

## 2021-05-09 NOTE — Discharge Instructions (Addendum)
As we discussed, refrain from taking your insulin tomorrow morning.  Please call your primary care doctor first thing Monday morning to arrange for an appointment.   Routinely check your blood sugars.  If they are continuously above 300, you can start taking insulin.  Return to emergency department if you have any low sugars.  Return the emergency department any confusion, vomiting, numbness/weakness or any other worsening concerning symptoms.

## 2021-05-09 NOTE — ED Notes (Signed)
Pt tolerating PO fluids

## 2021-05-09 NOTE — ED Notes (Signed)
Meal tray arrived

## 2021-05-09 NOTE — ED Triage Notes (Signed)
Pt from home BIB [facility] for c/o AMS, diaphoresis/pale presentation. BGL with family read 30; 76 with EMS. Pt drowsy and oriented x4. BGL on arrival 4`1. D50 given. Pt c/o generalized weakness and denies pain.

## 2021-05-11 ENCOUNTER — Telehealth: Payer: Self-pay | Admitting: Endocrinology

## 2021-05-11 NOTE — Telephone Encounter (Signed)
Pt had to go to hospital on Saturday due to low blood sugar. Due to blood sugar was 47. Pt wanted to let provider know. When checking blood sugar on 05/11/2021 6:30 120's at   8:05 it was 87 .  Pt is wondering what to do about blood sugar with this medication. Pt states blood sugars are dropping again. Request to be called.

## 2021-05-12 ENCOUNTER — Other Ambulatory Visit (HOSPITAL_BASED_OUTPATIENT_CLINIC_OR_DEPARTMENT_OTHER): Payer: Self-pay

## 2021-05-12 MED ORDER — PFIZER-BIONT COVID-19 VAC-TRIS 30 MCG/0.3ML IM SUSP
INTRAMUSCULAR | 0 refills | Status: DC
  Filled 2021-05-12: qty 0.3, 1d supply, fill #0

## 2021-05-12 NOTE — Telephone Encounter (Signed)
[name] [name], Is he off only the insulin or also Trijardy?   If he is off both, I would start with adding Trijardy back and let us know about the sugars in 1 or 2 days.   If he is only off insulin only, continue Trijardy and restart a low-dose NPH, 20 units in a.m. and 10 units at bedtime.  Let us know about the sugars in 1 or 2 days.

## 2021-05-12 NOTE — Telephone Encounter (Signed)
Pt calling to follow up on last message and states that his sugar is 223 this morning and states that the emergency room told him not to take medication until he talked with a provider. Pt would like a call back as soon as possible

## 2021-05-12 NOTE — Telephone Encounter (Signed)
Please advise 

## 2021-05-13 NOTE — Telephone Encounter (Signed)
Message left for patient to return my call.  

## 2021-05-13 NOTE — Telephone Encounter (Signed)
Spoken to patient and notified Dr Arman Filter comments. Verbalized understanding.

## 2021-05-13 NOTE — Telephone Encounter (Signed)
Ok, Let's restart Toujeo but at 20 units daily and let us know how the sugars are doing tomorrow.  We will most likely need to increase the dose but we need to do this slowly.

## 2021-05-13 NOTE — Telephone Encounter (Signed)
Patient called and left a voicemail on the after hours line. Stating he needs to know if he needs to start his insulin. He went to the ER on Saturday morning with an incident and they told him not to take his insulin until hearing back from the Dr (as previous message stated) and his sugar at the time of the call last night at 10pm was 450. Please advise.  Patient ph# [phone]

## 2021-05-13 NOTE — Telephone Encounter (Signed)
Patient called back. He stated that he have been off Toujeo since he was at the ER and he does not take Trijardy. He mention that he does not take any oral medication for DM.  Please advise.

## 2021-05-14 NOTE — Telephone Encounter (Signed)
Please advise 

## 2021-05-14 NOTE — Telephone Encounter (Signed)
Pt called to report his blood sugars this morning were 395 after breakfast (ate sausage mcmuffin)

## 2021-05-15 DIAGNOSIS — M1711 Unilateral primary osteoarthritis, right knee: Secondary | ICD-10-CM | POA: Diagnosis not present

## 2021-05-15 NOTE — Telephone Encounter (Signed)
I left a message before I left yesterday. Patient called back this morning.  He stated that he took 26 units yesterday morning and then 50 units last night. His blood sugar this morning was 160.  Please advise

## 2021-05-15 NOTE — Telephone Encounter (Signed)
Message left for patient to return my call.  

## 2021-05-18 ENCOUNTER — Other Ambulatory Visit (HOSPITAL_BASED_OUTPATIENT_CLINIC_OR_DEPARTMENT_OTHER): Payer: Self-pay

## 2021-05-18 ENCOUNTER — Other Ambulatory Visit: Payer: Self-pay | Admitting: Family Medicine

## 2021-05-18 MED ORDER — VITAMIN D (ERGOCALCIFEROL) 1.25 MG (50000 UNIT) PO CAPS
ORAL_CAPSULE | ORAL | 4 refills | Status: DC
  Filled 2021-05-18: qty 4, 28d supply, fill #0
  Filled 2021-06-10: qty 4, 28d supply, fill #1
  Filled 2021-07-10: qty 4, 28d supply, fill #2
  Filled 2021-08-19: qty 4, 28d supply, fill #3
  Filled 2021-10-09: qty 4, 28d supply, fill #4

## 2021-05-18 MED FILL — Isosorbide Mononitrate Tab ER 24HR 120 MG: ORAL | 90 days supply | Qty: 90 | Fill #0 | Status: AC

## 2021-05-18 MED FILL — Furosemide Tab 20 MG: ORAL | 30 days supply | Qty: 30 | Fill #1 | Status: AC

## 2021-05-18 MED FILL — Levothyroxine Sodium Tab 100 MCG: ORAL | 90 days supply | Qty: 90 | Fill #0 | Status: AC

## 2021-05-18 MED FILL — Continuous Glucose System Sensor: 28 days supply | Qty: 2 | Fill #0 | Status: AC

## 2021-05-18 MED FILL — Famotidine Tab 20 MG: ORAL | 30 days supply | Qty: 60 | Fill #1 | Status: AC

## 2021-05-18 NOTE — Telephone Encounter (Signed)
Last vit d checked on 02/23/21 and was 46.43.

## 2021-05-19 ENCOUNTER — Other Ambulatory Visit (HOSPITAL_BASED_OUTPATIENT_CLINIC_OR_DEPARTMENT_OTHER): Payer: Self-pay

## 2021-05-19 ENCOUNTER — Encounter: Payer: Medicare PPO | Attending: Endocrinology | Admitting: Nutrition

## 2021-05-19 DIAGNOSIS — E1165 Type 2 diabetes mellitus with hyperglycemia: Secondary | ICD-10-CM | POA: Insufficient documentation

## 2021-05-19 NOTE — Telephone Encounter (Signed)
Called and spoke with pt he is currently taking 140 units of Toujeo and his sugars have seemed to regulate.

## 2021-05-19 NOTE — Progress Notes (Signed)
[name] [name] is here to start the Libre 14 day sensors.  He says his insurance would not pay for this, but the pharmacist gave this to him. This was linked to his phone and to our clinic.  We discussed the difference between sensor readings and blood sugar readings, and when it is need to test his blood sugars.  He reported good understanding of this. He inserted the sensor into his left upper outer arm without difficulty.  He started the sensor and reviewed how to scan this.  He had no final questions.

## 2021-05-19 NOTE — Telephone Encounter (Signed)
Appointment scheduled for today 

## 2021-05-19 NOTE — Patient Instructions (Signed)
Insert a new sensor every 14 days Scan sensor at least every 8 hours. Call [phone] number if sensor falls off, or if questions.

## 2021-05-19 NOTE — Telephone Encounter (Signed)
Pt request an appt to set up freestyle libre. Requested a call.

## 2021-05-22 ENCOUNTER — Other Ambulatory Visit: Payer: Self-pay | Admitting: Family Medicine

## 2021-05-22 ENCOUNTER — Other Ambulatory Visit (HOSPITAL_BASED_OUTPATIENT_CLINIC_OR_DEPARTMENT_OTHER): Payer: Self-pay

## 2021-05-22 DIAGNOSIS — L309 Dermatitis, unspecified: Secondary | ICD-10-CM

## 2021-05-22 MED ORDER — TRIAMCINOLONE ACETONIDE 0.1 % EX CREA
1.0000 | TOPICAL_CREAM | Freq: Two times a day (BID) | CUTANEOUS | 1 refills | Status: DC
Start: 2021-05-22 — End: 2023-06-01
  Filled 2021-05-22: qty 30, 15d supply, fill #0

## 2021-05-25 ENCOUNTER — Other Ambulatory Visit (HOSPITAL_BASED_OUTPATIENT_CLINIC_OR_DEPARTMENT_OTHER): Payer: Self-pay

## 2021-05-26 ENCOUNTER — Encounter: Payer: Self-pay | Admitting: Family Medicine

## 2021-05-29 ENCOUNTER — Other Ambulatory Visit (HOSPITAL_BASED_OUTPATIENT_CLINIC_OR_DEPARTMENT_OTHER): Payer: Self-pay

## 2021-06-04 ENCOUNTER — Ambulatory Visit: Payer: Medicare PPO | Admitting: Endocrinology

## 2021-06-04 ENCOUNTER — Other Ambulatory Visit (HOSPITAL_BASED_OUTPATIENT_CLINIC_OR_DEPARTMENT_OTHER): Payer: Self-pay

## 2021-06-04 MED FILL — Continuous Glucose System Sensor: 28 days supply | Qty: 2 | Fill #1 | Status: AC

## 2021-06-05 ENCOUNTER — Encounter: Payer: Self-pay | Admitting: Internal Medicine

## 2021-06-05 ENCOUNTER — Ambulatory Visit (INDEPENDENT_AMBULATORY_CARE_PROVIDER_SITE_OTHER): Payer: Medicare PPO | Admitting: Internal Medicine

## 2021-06-05 ENCOUNTER — Other Ambulatory Visit (HOSPITAL_BASED_OUTPATIENT_CLINIC_OR_DEPARTMENT_OTHER): Payer: Self-pay

## 2021-06-05 ENCOUNTER — Other Ambulatory Visit: Payer: Self-pay

## 2021-06-05 VITALS — BP 122/66 | HR 61 | Temp 97.5°F | Resp 16 | Ht 69.0 in | Wt 207.2 lb

## 2021-06-05 DIAGNOSIS — R21 Rash and other nonspecific skin eruption: Secondary | ICD-10-CM | POA: Diagnosis not present

## 2021-06-05 DIAGNOSIS — D229 Melanocytic nevi, unspecified: Secondary | ICD-10-CM

## 2021-06-05 MED ORDER — HYDROCORTISONE 2.5 % EX CREA
TOPICAL_CREAM | Freq: Two times a day (BID) | CUTANEOUS | 1 refills | Status: DC
  Filled 2021-06-05: qty 30, 14d supply, fill #0
  Filled 2021-06-16: qty 30, 14d supply, fill #1

## 2021-06-05 NOTE — Patient Instructions (Addendum)
Apply the cream on your abdomen, twice daily for few days.  If you are not gradually better please let us know  We are referring you to a dermatologist for the moles on your back, this is not urgent, it may take a few days before they call you.

## 2021-06-05 NOTE — Progress Notes (Signed)
Subjective:    Patient ID: [name] [name], male    DOB: 11/16/1946, 76 y.o.   MRN: [id number]  DOS:  06/05/2021 Type of visit - description: Acute  After a recent ER visit he developed a rash at the abdomen, back, neck and arms. Overall is better but he still has the rash on the abdomen. It was extremely pruritic.  Also, has few moles on the back, he is concerned about them.  Chart is reviewed, went to the ER 05/09/2021, he was weak, diaphoretic, pale, work-up showed hypoglycemia, given D50, neuro exam was nonfocal, he felt better.  At this point CBGs ranged from 100-200.  No further symptoms.   Review of Systems See above   Past Medical History:  Diagnosis Date   Acquired atrophy of thyroid 06/05/2013   Overview:  July 2014: controlled on synthroid 75mcg since 2012 May 2015: decreased to Synthroid 50 mcg  Last Assessment & Plan:  Pt doing well with most recent TSH within normal range.  Will continue current dosage of Synthroid 71mcg. Pt reminded to take medication on empty stomach. Will continue to monitor with periodic laboratory assessment in 3 months.    Anemia    hemoglobin 7.4, iron deficiency, January, 2011, 2 unit transfusion, endoscopy normal, capsule endoscopy February, 2011 no small bowel abnormalities.   Most likely source gastric erosions, followed by GI   Anxiety    Bradycardia    CAD (coronary artery disease)    A. CABG in 2000,status post cardiac cath in 2006, 2009 ....continued chest pain and SOB despite oral medication adjestments including Ranexa. B. Cath November 2009/ mRCA - 2.75 x 23 Abbott Xience V drug-eluting stent ...11/26/2008 to distal  RCA leading to acute marginal.  C. Cath 07/2012 for CP - stable anatomy, med rx. d. cath 2015 and 05/30/2015 stable anatomy, consider Myoview if has CP again   Carotid artery disease ([place]) 08/01/2015   Doppler, May 29, 2015, 1-39% bilateral ICA    Cerebral ischemia    MRI November, 2010, chronic microvascular ischemia   CKD  (chronic kidney disease), stage III ([place])    Concussion    Depression    Bipolar   Diabetes mellitus ([place])    Edema    Essential hypertension 06/05/2013   Overview:  July 2014: Controlled with Bystolic Sep 5003: Changed to atenolol.  Last Assessment & Plan:  Will change to atenolol for cost and follow Improved.  Medication compliance strongly encouraged BP: 116/64 mmHg    Overview:  July 2014: Controlled with Bystolic Sep 7048: Changed to atenolol.  Last Assessment & Plan:  Will change to atenolol for cost and follow   Falling episodes    these have occurred in the past and again recurring 2011   Family history of adverse reaction to anesthesia    "mother died during bypass surgery but not sure if it has to do with anesthesia"   Gastric ulcer    GERD (gastroesophageal reflux disease)    H/O medication noncompliance    Due to loss of insurance   H/O multiple concussions    Hard of hearing    History of blood transfusion 12/20/2013   Hx of CABG    2000,  / one median sternotomy suture broken her chest x-ray November, 2010, no clinical significance   Hyperlipidemia    Hypertension    pt. denies   Iron deficiency anemia    Long term (current) use of anticoagulants [Z79.01] 07/20/2016   Low back pain 06/12/2009  Qualifier: Diagnosis of  By: [name]    Nephrolithiasis    Orthostasis    OSA (obstructive sleep apnea)    PAF (paroxysmal atrial fibrillation) ([place])    a. dx 2017, started on amiodarone/warfarin but patient intermittently noncompliant.   PSVT (paroxysmal supraventricular tachycardia) ([place])    RBBB 07/09/2009   Qualifier: Diagnosis of  By: [name], MD, [facility], [name]    RLS (restless legs syndrome) 09/19/2009   Qualifier: Diagnosis of  By: [name]   Overview:  July 2014: Controlled with Mirapex  Last Assessment & Plan:  Patient is doing well. Will continue current management and follow clinically.   Seizure disorder ([place]) 01/09/2017   Spondylosis    C5-6, C6-7  MRI 2010   Syncope 03/2016   Thyroid disease    Tubulovillous adenoma of colon 2007   Vitamin D deficiency 05/17/2017   Wears glasses     Past Surgical History:  Procedure Laterality Date   ANTERIOR CERVICAL DECOMP/DISCECTOMY FUSION N/A 05/31/2016   Procedure: ANTERIOR CERVICAL DECOMPRESSION/DISCECTOMY FUSION CERVICAL FIVE-SIX,CERVICAL SIX-SEVEN;  Surgeon: [name], MD;  Location: [place] [facility];  Service: Neurosurgery;  Laterality: N/A;   CARDIAC CATHETERIZATION N/A 05/30/2015   Procedure: Left Heart Cath and Coronary Angiography;  Surgeon: [name], MD;  Location: [place] [facility];  Service: Cardiovascular;  Laterality: N/A;   COLONOSCOPY     CORONARY ARTERY BYPASS GRAFT     2000   ESOPHAGOGASTRODUODENOSCOPY     LEFT HEART CATH AND CORS/GRAFTS ANGIOGRAPHY N/A 12/15/2017   Procedure: LEFT HEART CATH AND CORS/GRAFTS ANGIOGRAPHY;  Surgeon: [name], MD;  Location: [place] [facility];  Service: Cardiovascular;  Laterality: N/A;   LEFT HEART CATHETERIZATION WITH CORONARY/GRAFT ANGIOGRAM N/A 08/01/2012   Procedure: LEFT HEART CATHETERIZATION WITH Beatrix Fetters;  Surgeon: [name], MD;  Location: [facility];  Service: Cardiovascular;  Laterality: N/A;   LEFT HEART CATHETERIZATION WITH CORONARY/GRAFT ANGIOGRAM N/A 01/03/2015   Procedure: LEFT HEART CATHETERIZATION WITH Beatrix Fetters;  Surgeon: [name], MD;  Location: [facility];  Service: Cardiovascular;  Laterality: N/A;   NASAL SEPTUM SURGERY     UP3   PERCUTANEOUS CORONARY STENT INTERVENTION (PCI-S)  10/2008   mRCA PCI  2.75 x 23 Abbott Xience V drug-eluting stent    RIGHT/LEFT HEART CATH AND CORONARY/GRAFT ANGIOGRAPHY N/A 01/07/2020   Procedure: RIGHT/LEFT HEART CATH AND CORONARY/GRAFT ANGIOGRAPHY;  Surgeon: [name], MD;  Location: [place] [facility];  Service: Cardiovascular;  Laterality: N/A;   ULTRASOUND GUIDANCE FOR VASCULAR ACCESS  12/15/2017   Procedure: Ultrasound Guidance  For Vascular Access;  Surgeon: [name], MD;  Location: [place] [facility];  Service: Cardiovascular;;    Allergies as of 06/05/2021       Reactions   Morphine Other (See Comments)   hallucinations   Shellfish-derived Products Itching        Medication List        Accurate as of June 05, 2021 11:59 PM. If you have any questions, ask your nurse or doctor.          STOP taking these medications    Fluad Quadrivalent 0.5 ML injection Generic drug: influenza vaccine adjuvanted Stopped by: Kathlene November, MD   [place] by: Kathlene November, MD   Pfizer-BioNT COVID-19 Vac-TriS Susp injection Generic drug: COVID-19 mRNA Vac-TriS Therapist, music) Stopped by: Kathlene November, MD       TAKE these  medications    albuterol 108 (90 Base) MCG/ACT inhaler Commonly known as: VENTOLIN HFA Inhale 2 puffs into the lungs every 6 (six) hours as needed for wheezing or shortness of breath.   amLODipine 5 MG tablet Commonly known as: NORVASC TAKE 1 TABLET BY MOUTH ONCE DAILY What changed: Another medication with the same name was removed. Continue taking this medication, and follow the directions you see here. Changed by: Kathlene November, MD   atorvastatin 80 MG tablet Commonly known as: LIPITOR Take 1 tablet (80 mg total) by mouth daily. What changed: when to take this   busPIRone 7.5 MG tablet Commonly known as: BUSPAR Take 1 tablet (7.5 mg total) by mouth 2 (two) times daily.   cetirizine 10 MG tablet Commonly known as: ZYRTEC TAKE 1 TABLET (10 MG TOTAL) BY MOUTH DAILY. What changed: how much to take   Eliquis 5 MG Tabs tablet Generic drug: apixaban TAKE 1 TABLET BY MOUTH TWICE DAILY What changed: how much to take   famotidine 20 MG tablet Commonly known as: PEPCID TAKE 1 TABLET BY MOUTH 2 TIMES DAILY What changed:  how much to take when to take this   fenofibrate 145 MG tablet Commonly known as: TRICOR TAKE 1 TABLET (145 MG TOTAL) BY MOUTH  DAILY. What changed: how much to take   FreeStyle Libre 14 Day Sensor Misc USE TO CHECK BLOOD SUGAR AND CHANGE EVERY 14 DAYS   furosemide 20 MG tablet Commonly known as: LASIX TAKE 1 TABLET BY MOUTH DAILY AS NEEDED FOR EDEMA What changed:  how much to take how to take this when to take this reasons to take this   glucose blood test strip Commonly known as: Accu-Chek Aviva Use as directed to check blood sugar 4 times daily.  DX E11.9   HYDROcodone-acetaminophen 5-325 MG tablet Commonly known as: NORCO/VICODIN TAKE 1 TABLET BY MOUTH EVERY 8 (EIGHT) HOURS AS NEEDED.   hydrocortisone 2.5 % cream Apply on to the skin 2 (two) times daily. Started by: Kathlene November, MD   Insulin Pen Needle 31G X 8 MM Misc use to inject insulin twice a day What changed: Another medication with the same name was removed. Continue taking this medication, and follow the directions you see here. Changed by: Kathlene November, MD   isosorbide mononitrate 120 MG 24 hr tablet Commonly known as: IMDUR TAKE 1 TABLET (120 MG TOTAL) BY MOUTH DAILY. What changed:  how much to take how to take this when to take this   levothyroxine 100 MCG tablet Commonly known as: SYNTHROID TAKE 1 TABLET (100 MCG TOTAL) BY MOUTH DAILY. What changed: how much to take   metoprolol succinate 50 MG 24 hr tablet Commonly known as: TOPROL-XL TAKE 1 AND 1/2 TABLETS EVERY DAY FOR A TOTAL OF 75 MG What changed: See the new instructions.   nitroGLYCERIN 0.4 MG SL tablet Commonly known as: NITROSTAT PLACE 1 TABLET UNDER THE TONGUE EVERY 5 MINUTES AS NEEDED FOR CHEST PAIN.   pantoprazole 40 MG tablet Commonly known as: PROTONIX TAKE 1 TABLET EVERY DAY   Pennsaid 2 % Soln Generic drug: Diclofenac Sodium Place 1 application onto the skin 2 (two) times daily.   pramipexole 1 MG tablet Commonly known as: MIRAPEX TAKE 1/2 TO 1 TABLET AT BEDTIME   ranolazine 500 MG 12 hr tablet Commonly known as: RANEXA TAKE 1 TABLET BY MOUTH TWICE  DAILY What changed: how much to take   sertraline 100 MG tablet Commonly known as: ZOLOFT TAKE 1  TABLET BY MOUTH TWICE DAILY   tiZANidine 4 MG tablet Commonly known as: ZANAFLEX TAKE 1 TABLET BY MOUTH EVERY 6 HOURS AS NEEDED FOR MUSCLE SPASMS   Toujeo Max SoloStar 300 UNIT/ML Solostar Pen Generic drug: insulin glargine (2 Unit Dial) Inject 140 Units into the skin every morning.   triamcinolone cream 0.1 % Commonly known as: KENALOG Apply 1 application topically 2 (two) times daily. Use as needed for itchy rash   UltiCare Insulin Syringe 31G X 5/16" 1 ML Misc Generic drug: Insulin Syringe-Needle U-100 USE AS DIRECTED WITH NOVOLIN What changed: Another medication with the same name was removed. Continue taking this medication, and follow the directions you see here. Changed by: Kathlene November, MD   Vitamin D (Ergocalciferol) 1.25 MG (50000 UNIT) Caps capsule Commonly known as: DRISDOL TAKE 1 CAPSULE BY MOUTH EVERY 7 DAYS           Objective:   Physical Exam BP 122/66 (BP Location: Left Arm, Patient Position: Sitting, Cuff Size: Normal)   Pulse 61   Temp (!) 97.5 F (36.4 C) (Oral)   Resp 16   Ht 5\' 9"  (1.753 m)   Wt 207 lb 4 oz (94 kg)   SpO2 97%   BMI 30.61 kg/m  General:   Well developed, NAD, BMI noted. HEENT:  Normocephalic . Face symmetric, atraumatic  Skin: Not pale. Not jaundice. Has evidence of scratching both sides of the abdomen, skin is dry, there is no overuse rash. At the back he has a number of the skin lesions including SKs and also two dark-bicolor moles.  (Lower back right mid back)   Neurologic:  alert & oriented X3.  Speech normal, gait and transferring limited due to knee pain Psych--  Cognition and judgment appear intact.  Cooperative with normal attention span and concentration.  Behavior appropriate. No anxious or depressed appearing.      Assessment     75 year old gentleman, PMH includes CAD, atrial fibrillation, carotid artery  disease, CHF, HTN, OSA, DM, GERD, anticoagulated, and on multiple medications, presents with.  Pruritic rash: Rash is nonspecific,  improving, currently only at the abdomen.  Recommend hydrocortisone 2.5% for few days and observation. Moles: He has a number of SKs at the back and has 2 moles that are dark.  Refer to dermatology. DM: had  hypoglycemia, went to the ER (see HPI), now better   This visit occurred during the SARS-CoV-2 public health emergency.  Safety protocols were in place, including screening questions prior to the visit, additional usage of staff PPE, and extensive cleaning of exam room while observing appropriate contact time as indicated for disinfecting solutions.

## 2021-06-10 ENCOUNTER — Other Ambulatory Visit (HOSPITAL_BASED_OUTPATIENT_CLINIC_OR_DEPARTMENT_OTHER): Payer: Self-pay

## 2021-06-10 ENCOUNTER — Other Ambulatory Visit: Payer: Self-pay | Admitting: Family Medicine

## 2021-06-10 ENCOUNTER — Other Ambulatory Visit: Payer: Self-pay | Admitting: Interventional Cardiology

## 2021-06-10 DIAGNOSIS — I4821 Permanent atrial fibrillation: Secondary | ICD-10-CM

## 2021-06-10 MED ORDER — FENOFIBRATE 145 MG PO TABS
ORAL_TABLET | Freq: Every day | ORAL | 1 refills | Status: DC
  Filled 2021-06-10: qty 90, 90d supply, fill #0
  Filled 2021-09-08: qty 90, 90d supply, fill #1

## 2021-06-10 MED ORDER — APIXABAN 5 MG PO TABS
5.0000 mg | ORAL_TABLET | Freq: Two times a day (BID) | ORAL | 5 refills | Status: DC
  Filled 2021-06-10: qty 60, 30d supply, fill #0
  Filled 2021-07-10: qty 60, 30d supply, fill #1
  Filled 2021-08-19: qty 60, 30d supply, fill #2
  Filled 2021-11-02 (×2): qty 60, 30d supply, fill #3
  Filled 2021-12-07: qty 60, 30d supply, fill #4
  Filled 2022-01-06: qty 60, 30d supply, fill #5

## 2021-06-10 NOTE — Telephone Encounter (Signed)
Eliquis 5mg  refill request received. Patient is 75 years old, weight-94kg, Crea-2.16 on 05/09/2021, Diagnosis-Afib, and last seen by [name] on 03/19/2021. Dose is appropriate based on dosing criteria. Will send in refill to requested pharmacy.

## 2021-06-11 ENCOUNTER — Other Ambulatory Visit (HOSPITAL_BASED_OUTPATIENT_CLINIC_OR_DEPARTMENT_OTHER): Payer: Self-pay

## 2021-06-12 ENCOUNTER — Other Ambulatory Visit (HOSPITAL_BASED_OUTPATIENT_CLINIC_OR_DEPARTMENT_OTHER): Payer: Self-pay

## 2021-06-16 ENCOUNTER — Other Ambulatory Visit: Payer: Self-pay

## 2021-06-16 ENCOUNTER — Ambulatory Visit: Payer: Medicare PPO | Admitting: Endocrinology

## 2021-06-16 ENCOUNTER — Other Ambulatory Visit (HOSPITAL_BASED_OUTPATIENT_CLINIC_OR_DEPARTMENT_OTHER): Payer: Self-pay

## 2021-06-16 VITALS — BP 128/82 | HR 63 | Ht 69.0 in | Wt 205.0 lb

## 2021-06-16 DIAGNOSIS — E1165 Type 2 diabetes mellitus with hyperglycemia: Secondary | ICD-10-CM

## 2021-06-16 LAB — POCT GLYCOSYLATED HEMOGLOBIN (HGB A1C): Hemoglobin A1C: 7.2 % — AB (ref 4.0–5.6)

## 2021-06-16 MED ORDER — TOUJEO MAX SOLOSTAR 300 UNIT/ML ~~LOC~~ SOPN: 130.0000 [IU] | PEN_INJECTOR | SUBCUTANEOUS | 3 refills | Status: DC

## 2021-06-16 MED ORDER — TOUJEO MAX SOLOSTAR 300 UNIT/ML ~~LOC~~ SOPN
130.0000 [IU] | PEN_INJECTOR | SUBCUTANEOUS | 3 refills | Status: DC
  Filled 2021-06-16: qty 18, 31d supply, fill #0
  Filled 2021-07-09: qty 18, 31d supply, fill #1
  Filled 2021-07-10: qty 18, 42d supply, fill #1
  Filled 2021-09-09: qty 18, 42d supply, fill #2

## 2021-06-16 NOTE — Patient Instructions (Addendum)
check your blood sugar twice a day.  vary the time of day when you check, between before the 3 meals, and at bedtime.  also check if you have symptoms of your blood sugar being too high or too low.  please keep a record of the readings and bring it to your next appointment here (or you can bring the meter itself).  You can write it on any piece of paper.  please call us sooner if your blood sugar goes below 70, or if you have a lot of readings over 200.   Please reduce Toujeo to 130 units each morning.   Please come back for a follow-up appointment in 2 months.   Please try to buy just enough Toujeo to get you to your next appointment, as we may need to change to a different insulin next time.

## 2021-06-16 NOTE — Progress Notes (Signed)
Subjective:    Patient ID: [name] [name], male    DOB: January 24, 1946, 75 y.o.   MRN: [id number]  HPI Pt returns for f/u of diabetes mellitus:  DM type: Insulin-requiring type 2.  Dx'ed: 6606 Complications: PN, stage 3b CRI, CAD and PAD.   Therapy: insulin since 2008.   DKA: never Severe hypoglycemia: once (2022) Pancreatitis: never Pancreatic imaging: 2017 CT showed fatty replacement of the pancreatic head and body.   SDOH: he could not afford Trulicity.   Other: he takes QD insulin, after poor results with multiple daily injections; he is not using continuous glucose monitor.   Interval history: I reviewed continuous glucose monitor data.  Glucose varies from 40-350.  It is in general higher as the day goes on.  Past Medical History:  Diagnosis Date   Acquired atrophy of thyroid 06/05/2013   Overview:  July 2014: controlled on synthroid 59mcg since 2012 May 2015: decreased to Synthroid 50 mcg  Last Assessment & Plan:  Pt doing well with most recent TSH within normal range.  Will continue current dosage of Synthroid 51mcg. Pt reminded to take medication on empty stomach. Will continue to monitor with periodic laboratory assessment in 3 months.    Anemia    hemoglobin 7.4, iron deficiency, January, 2011, 2 unit transfusion, endoscopy normal, capsule endoscopy February, 2011 no small bowel abnormalities.   Most likely source gastric erosions, followed by GI   Anxiety    Bradycardia    CAD (coronary artery disease)    A. CABG in 2000,status post cardiac cath in 2006, 2009 ....continued chest pain and SOB despite oral medication adjestments including Ranexa. B. Cath November 2009/ mRCA - 2.75 x 23 Abbott Xience V drug-eluting stent ...11/26/2008 to distal  RCA leading to acute marginal.  C. Cath 07/2012 for CP - stable anatomy, med rx. d. cath 2015 and 05/30/2015 stable anatomy, consider Myoview if has CP again   Carotid artery disease ([place]) 08/01/2015   Doppler, May 29, 2015, 1-39% bilateral  ICA    Cerebral ischemia    MRI November, 2010, chronic microvascular ischemia   CKD (chronic kidney disease), stage III ([place])    Concussion    Depression    Bipolar   Diabetes mellitus ([place])    Edema    Essential hypertension 06/05/2013   Overview:  July 2014: Controlled with Bystolic Sep 3016: Changed to atenolol.  Last Assessment & Plan:  Will change to atenolol for cost and follow Improved.  Medication compliance strongly encouraged BP: 116/64 mmHg    Overview:  July 2014: Controlled with Bystolic Sep 0109: Changed to atenolol.  Last Assessment & Plan:  Will change to atenolol for cost and follow   Falling episodes    these have occurred in the past and again recurring 2011   Family history of adverse reaction to anesthesia    "mother died during bypass surgery but not sure if it has to do with anesthesia"   Gastric ulcer    GERD (gastroesophageal reflux disease)    H/O medication noncompliance    Due to loss of insurance   H/O multiple concussions    Hard of hearing    History of blood transfusion 12/20/2013   Hx of CABG    2000,  / one median sternotomy suture broken her chest x-ray November, 2010, no clinical significance   Hyperlipidemia    Hypertension    pt. denies   Iron deficiency anemia    Long term (current) use of anticoagulants [  Z79.01] 07/20/2016   Low back pain 06/12/2009   Qualifier: Diagnosis of  By: [name]    Nephrolithiasis    Orthostasis    OSA (obstructive sleep apnea)    PAF (paroxysmal atrial fibrillation) ([place])    a. dx 2017, started on amiodarone/warfarin but patient intermittently noncompliant.   PSVT (paroxysmal supraventricular tachycardia) ([place])    RBBB 07/09/2009   Qualifier: Diagnosis of  By: [name], MD, [facility], [name]    RLS (restless legs syndrome) 09/19/2009   Qualifier: Diagnosis of  By: [name]   Overview:  July 2014: Controlled with Mirapex  Last Assessment & Plan:  Patient is doing well. Will continue current management  and follow clinically.   Seizure disorder ([place]) 01/09/2017   Spondylosis    C5-6, C6-7 MRI 2010   Syncope 03/2016   Thyroid disease    Tubulovillous adenoma of colon 2007   Vitamin D deficiency 05/17/2017   Wears glasses     Past Surgical History:  Procedure Laterality Date   ANTERIOR CERVICAL DECOMP/DISCECTOMY FUSION N/A 05/31/2016   Procedure: ANTERIOR CERVICAL DECOMPRESSION/DISCECTOMY FUSION CERVICAL FIVE-SIX,CERVICAL SIX-SEVEN;  Surgeon: [name], MD;  Location: [place] [facility];  Service: Neurosurgery;  Laterality: N/A;   CARDIAC CATHETERIZATION N/A 05/30/2015   Procedure: Left Heart Cath and Coronary Angiography;  Surgeon: [name], MD;  Location: [place] [facility];  Service: Cardiovascular;  Laterality: N/A;   COLONOSCOPY     CORONARY ARTERY BYPASS GRAFT     2000   ESOPHAGOGASTRODUODENOSCOPY     LEFT HEART CATH AND CORS/GRAFTS ANGIOGRAPHY N/A 12/15/2017   Procedure: LEFT HEART CATH AND CORS/GRAFTS ANGIOGRAPHY;  Surgeon: [name], MD;  Location: [place] CV LAB;  Service: Cardiovascular;  Laterality: N/A;   LEFT HEART CATHETERIZATION WITH CORONARY/GRAFT ANGIOGRAM N/A 08/01/2012   Procedure: LEFT HEART CATHETERIZATION WITH Beatrix Fetters;  Surgeon: [name], MD;  Location: [facility] CATH LAB;  Service: Cardiovascular;  Laterality: N/A;   LEFT HEART CATHETERIZATION WITH CORONARY/GRAFT ANGIOGRAM N/A 01/03/2015   Procedure: LEFT HEART CATHETERIZATION WITH Beatrix Fetters;  Surgeon: [name], MD;  Location: [facility] CATH LAB;  Service: Cardiovascular;  Laterality: N/A;   NASAL SEPTUM SURGERY     UP3   PERCUTANEOUS CORONARY STENT INTERVENTION (PCI-S)  10/2008   mRCA PCI  2.75 x 23 Abbott Xience V drug-eluting stent    RIGHT/LEFT HEART CATH AND CORONARY/GRAFT ANGIOGRAPHY N/A 01/07/2020   Procedure: RIGHT/LEFT HEART CATH AND CORONARY/GRAFT ANGIOGRAPHY;  Surgeon: [name], MD;  Location: [place] CV LAB;  Service: Cardiovascular;  Laterality: N/A;    ULTRASOUND GUIDANCE FOR VASCULAR ACCESS  12/15/2017   Procedure: Ultrasound Guidance For Vascular Access;  Surgeon: [name], MD;  Location: [place] CV LAB;  Service: Cardiovascular;;    Social History   Socioeconomic History   Marital status: Married    Spouse name: Not on file   Number of children: 4   Years of education: 13   Highest education level: Not on file  Occupational History   Occupation: retired  Tobacco Use   Smoking status: Never   Smokeless tobacco: Never  Vaping Use   Vaping Use: Never used  Substance and Sexual Activity   Alcohol use: No    Alcohol/week: 0.0 standard drinks    Comment: stopped drinking in 1998   Drug use: No   Sexual activity: Not on file  Other Topics Concern   Not on file  Social History Narrative  Patient is right handed.   Patient does not drink caffeine.   Social Determinants of Health   Financial Resource Strain: Not on file  Food Insecurity: Not on file  Transportation Needs: Not on file  Physical Activity: Not on file  Stress: Not on file  Social Connections: Not on file  Intimate Partner Violence: Not on file    Current Outpatient Medications on File Prior to Visit  Medication Sig Dispense Refill   albuterol (VENTOLIN HFA) 108 (90 Base) MCG/ACT inhaler Inhale 2 puffs into the lungs every 6 (six) hours as needed for wheezing or shortness of breath. 18 g 2   amLODipine (NORVASC) 5 MG tablet TAKE 1 TABLET BY MOUTH ONCE DAILY 90 tablet 2   apixaban (ELIQUIS) 5 MG TABS tablet Take 1 tablet (5 mg total) by mouth 2 (two) times daily. 60 tablet 5   atorvastatin (LIPITOR) 80 MG tablet Take 1 tablet (80 mg total) by mouth daily. (Patient taking differently: Take 80 mg by mouth at bedtime.) 90 tablet 3   busPIRone (BUSPAR) 7.5 MG tablet Take 1 tablet (7.5 mg total) by mouth 2 (two) times daily. 60 tablet 1   cetirizine (ZYRTEC) 10 MG tablet TAKE 1 TABLET (10 MG TOTAL) BY MOUTH DAILY. (Patient taking differently: Take  10 mg by mouth daily.) 100 tablet 2   Continuous Blood Gluc Sensor (FREESTYLE LIBRE 14 DAY SENSOR) MISC USE TO CHECK BLOOD SUGAR AND CHANGE EVERY 14 DAYS 6 each 3   Diclofenac Sodium (PENNSAID) 2 % SOLN Place 1 application onto the skin 2 (two) times daily. 112 g 2   famotidine (PEPCID) 20 MG tablet TAKE 1 TABLET BY MOUTH 2 TIMES DAILY (Patient taking differently: Take 40 mg by mouth daily.) 60 tablet 2   fenofibrate (TRICOR) 145 MG tablet TAKE 1 TABLET (145 MG TOTAL) BY MOUTH DAILY. 90 tablet 1   furosemide (LASIX) 20 MG tablet TAKE 1 TABLET BY MOUTH DAILY AS NEEDED FOR EDEMA (Patient taking differently: Take 20 mg by mouth daily as needed for edema.) 30 tablet 3   glucose blood (ACCU-CHEK AVIVA) test strip Use as directed to check blood sugar 4 times daily.  DX E11.9 200 each 6   HYDROcodone-acetaminophen (NORCO/VICODIN) 5-325 MG tablet TAKE 1 TABLET BY MOUTH EVERY 8 (EIGHT) HOURS AS NEEDED. 15 tablet 0   hydrocortisone 2.5 % cream Apply on to the skin 2 (two) times daily. 30 g 1   Insulin Pen Needle 31G X 8 MM MISC use to inject insulin twice a day 100 each 3   Insulin Syringe-Needle U-100 31G X 5/16" 1 ML MISC USE AS DIRECTED WITH NOVOLIN 100 each 0   isosorbide mononitrate (IMDUR) 120 MG 24 hr tablet TAKE 1 TABLET (120 MG TOTAL) BY MOUTH DAILY. (Patient taking differently: Take 120 mg by mouth daily.) 90 tablet 3   levothyroxine (SYNTHROID) 100 MCG tablet TAKE 1 TABLET (100 MCG TOTAL) BY MOUTH DAILY. (Patient taking differently: Take 100 mcg by mouth daily.) 90 tablet 1   metoprolol succinate (TOPROL-XL) 50 MG 24 hr tablet TAKE 1 AND 1/2 TABLETS EVERY DAY FOR A TOTAL OF 75 MG (Patient taking differently: Take 75 mg by mouth daily.) 135 tablet 3   nitroGLYCERIN (NITROSTAT) 0.4 MG SL tablet PLACE 1 TABLET UNDER THE TONGUE EVERY 5 MINUTES AS NEEDED FOR CHEST PAIN. 25 tablet 1   pantoprazole (PROTONIX) 40 MG tablet TAKE 1 TABLET EVERY DAY 90 tablet 3   pramipexole (MIRAPEX) 1 MG tablet TAKE 1/2 TO 1  TABLET AT BEDTIME 90 tablet 0   ranolazine (RANEXA) 500 MG 12 hr tablet TAKE 1 TABLET BY MOUTH TWICE DAILY (Patient taking differently: Take 500 mg by mouth 2 (two) times daily.) 60 tablet 5   sertraline (ZOLOFT) 100 MG tablet TAKE 1 TABLET BY MOUTH TWICE DAILY 180 tablet 1   tiZANidine (ZANAFLEX) 4 MG tablet TAKE 1 TABLET BY MOUTH EVERY 6 HOURS AS NEEDED FOR MUSCLE SPASMS 40 tablet 2   triamcinolone cream (KENALOG) 0.1 % Apply 1 application topically 2 (two) times daily. Use as needed for itchy rash 30 g 1   Vitamin D, Ergocalciferol, (DRISDOL) 1.25 MG (50000 UNIT) CAPS capsule TAKE 1 CAPSULE BY MOUTH EVERY 7 DAYS 4 capsule 4   No current facility-administered medications on file prior to visit.    Allergies  Allergen Reactions   Morphine Other (See Comments)    hallucinations   Shellfish-Derived Products Itching    Family History  Problem Relation Age of Onset   Pancreatic cancer Brother    Diabetes Brother    Coronary artery disease Brother    Stroke Brother    Diabetes Brother    Diabetes Mother    Heart failure Mother    Heart failure Father    Hypothyroidism Brother    Coronary artery disease Brother    Other Brother        colon surgery   Heart attack Other        Nephew   Irregular heart beat Daughter    Cancer Maternal Grandmother        unknown    Colon cancer Neg Hx    Stomach cancer Neg Hx    Liver cancer Neg Hx    Rectal cancer Neg Hx    Esophageal cancer Neg Hx     BP 128/82 (BP Location: Left Arm, Patient Position: Sitting, Cuff Size: Normal)   Pulse 63   Ht 5\' 9"  (1.753 m)   Wt 205 lb (93 kg)   SpO2 97%   BMI 30.27 kg/m    Review of Systems He denies hypoglycemia.     Objective:   Physical Exam Pulses: dorsalis pedis intact bilat.   MSK: no deformity of the feet CV: 1+ bilat leg edema Skin:  no ulcer on the feet.  normal color and temp on the feet. Neuro: sensation is intact to touch on the feet.  Ext: there is bilateral onychomycosis of  the toenails.     A1c=7.2%    Assessment & Plan:  Insulin-requiring type 2 DM. Hypoglycemia, due to insulin: this limits aggressiveness of glycemic control   Patient Instructions  check your blood sugar twice a day.  vary the time of day when you check, between before the 3 meals, and at bedtime.  also check if you have symptoms of your blood sugar being too high or too low.  please keep a record of the readings and bring it to your next appointment here (or you can bring the meter itself).  You can write it on any piece of paper.  please call us sooner if your blood sugar goes below 70, or if you have a lot of readings over 200.   Please reduce Toujeo to 130 units each morning.   Please come back for a follow-up appointment in 2 months.   Please try to buy just enough Toujeo to get you to your next appointment, as we may need to change to a different insulin next time.

## 2021-06-17 DIAGNOSIS — M1711 Unilateral primary osteoarthritis, right knee: Secondary | ICD-10-CM | POA: Diagnosis not present

## 2021-06-17 DIAGNOSIS — M25561 Pain in right knee: Secondary | ICD-10-CM | POA: Diagnosis not present

## 2021-06-23 ENCOUNTER — Other Ambulatory Visit: Payer: Self-pay | Admitting: Family Medicine

## 2021-06-23 ENCOUNTER — Other Ambulatory Visit (HOSPITAL_BASED_OUTPATIENT_CLINIC_OR_DEPARTMENT_OTHER): Payer: Self-pay

## 2021-06-23 ENCOUNTER — Other Ambulatory Visit: Payer: Self-pay | Admitting: Internal Medicine

## 2021-06-23 MED ORDER — FAMOTIDINE 20 MG PO TABS
ORAL_TABLET | Freq: Two times a day (BID) | ORAL | 2 refills | Status: DC
  Filled 2021-06-23: qty 60, 30d supply, fill #0
  Filled 2021-07-13: qty 60, 30d supply, fill #1
  Filled 2021-08-19: qty 60, 30d supply, fill #2

## 2021-06-23 MED FILL — Cetirizine HCl Tab 10 MG: ORAL | 100 days supply | Qty: 100 | Fill #0 | Status: AC

## 2021-06-23 MED FILL — Ranolazine Tab ER 12HR 500 MG: ORAL | 30 days supply | Qty: 60 | Fill #3 | Status: AC

## 2021-06-24 ENCOUNTER — Other Ambulatory Visit: Payer: Self-pay | Admitting: Orthopaedic Surgery

## 2021-06-24 ENCOUNTER — Other Ambulatory Visit (HOSPITAL_BASED_OUTPATIENT_CLINIC_OR_DEPARTMENT_OTHER): Payer: Self-pay

## 2021-06-24 ENCOUNTER — Telehealth: Payer: Self-pay | Admitting: Interventional Cardiology

## 2021-06-24 DIAGNOSIS — Z01818 Encounter for other preprocedural examination: Secondary | ICD-10-CM

## 2021-06-24 MED ORDER — HYDROCORTISONE 2.5 % EX CREA
TOPICAL_CREAM | Freq: Two times a day (BID) | CUTANEOUS | 1 refills | Status: DC
  Filled 2021-06-24: qty 30, 15d supply, fill #0
  Filled 2021-06-29: qty 30, 14d supply, fill #0
  Filled 2021-09-09: qty 30, 14d supply, fill #1

## 2021-06-24 NOTE — Telephone Encounter (Signed)
   [facility] Pre-operative Risk Assessment    Request for surgical clearance:  What type of surgery is being performed? Right knew replacement   When is this surgery scheduled? Aug 16th   What type of clearance is required (medical clearance vs. Pharmacy clearance to hold med vs. Both)? both  Are there any medications that need to be held prior to surgery and how long? TBD ( apixaban (ELIQUIS) 5 MG TABS tablet)  Practice name and name of physician performing surgery?  Dr. [name]   What is your office phone number [phone]    7.   What is your office fax number [phone]  8.   Anesthesia type (None, local, MAC, general) ? Spinal anesthesia   Milbert Coulter 06/24/2021, 2:49 PM  _________________________________________________________________   (provider comments below)

## 2021-06-25 ENCOUNTER — Other Ambulatory Visit (HOSPITAL_BASED_OUTPATIENT_CLINIC_OR_DEPARTMENT_OTHER): Payer: Self-pay

## 2021-06-25 ENCOUNTER — Encounter: Payer: Self-pay | Admitting: Family Medicine

## 2021-06-25 NOTE — Telephone Encounter (Signed)
Patient with diagnosis of afib on Eliquis for anticoagulation.    Procedure: right knee replacement Date of procedure: 07/21/21  CHA2DS2-VASc Score = 5  This indicates a 7.2% annual risk of stroke. The patient's score is based upon: CHF History: Yes HTN History: Yes Diabetes History: Yes Stroke History: No Vascular Disease History: Yes Age Score: 1 Gender Score: 0      CrCl 38 ml/min Platelet count 187  Per office protocol, patient can hold Eliquis for 3 days prior to procedure.

## 2021-06-25 NOTE — Telephone Encounter (Signed)
Routing to pharmacy for recommendations on [name] [name].

## 2021-06-26 ENCOUNTER — Encounter: Payer: Self-pay | Admitting: *Deleted

## 2021-06-26 ENCOUNTER — Telehealth: Payer: Self-pay | Admitting: *Deleted

## 2021-06-26 NOTE — Telephone Encounter (Signed)
   Name: [name] [name]  DOB: Mar 31, 1946  MRN: [id number]   Primary Cardiologist: Larae Grooms, MD  Chart reviewed as part of pre-operative protocol coverage for R knee surgery. He has history of CAD (CABG 2000 with stent to RCA 2009), PAF, PSVT, CKDIII, noncompliance, orthostatic hypotension, iron deficiency anemia, prior gastric erosions, ulcer, DM2, HTN, HLD, kidney stones, OSA, RBBB, thyroid dz, spondylosis, and back pain.  Patient was contacted 06/26/2021 in reference to pre-operative risk assessment for pending surgery as outlined below.    [name] [name] was last seen on 4/41/22 by [name] for cardiac clearance.  He reported some DOE, attributed to deconditioning 2/2 needing a knee replacement. No chest pain. No further testing was felt needed prior to proceeding with surgery. Since that day, [name] [name] has done well.  He cannot achieve 4 METS with consideration of his knee issues.  His RCRI score is calculated at >11% risk of MACE when considering his CAD, renal function, and that he is on insulin for DM2. Based on ACC/AHA guidelines, he may proceed with the planned procedure without further cardiovascular testing. Last cath 01/07/20 with stable images compared with 2019 and right heart pressures nl without explanation for his DOE. Echo EF 60-65% with mildly incrased LVH of the basal septum.  He should hold his Eliquis for 3 days prior to the procedure.   I will route this recommendation to the requesting party via Epic fax function and remove from pre-op pool. Please call with questions.  Arvil Chaco, PA-C 06/26/2021, 8:17 AM

## 2021-06-26 NOTE — Telephone Encounter (Signed)
Appointment made for Monday

## 2021-06-26 NOTE — Telephone Encounter (Signed)
Received fax from [facility] for surgical clearance. Per Dr. Charlett Blake needs appointment before we can fill out.  Left message on machine for patient to call back to schedule.  Also mychart sent to patient.

## 2021-06-29 ENCOUNTER — Other Ambulatory Visit (HOSPITAL_BASED_OUTPATIENT_CLINIC_OR_DEPARTMENT_OTHER): Payer: Self-pay

## 2021-06-29 ENCOUNTER — Other Ambulatory Visit: Payer: Self-pay

## 2021-06-29 ENCOUNTER — Ambulatory Visit (INDEPENDENT_AMBULATORY_CARE_PROVIDER_SITE_OTHER): Payer: Medicare PPO | Admitting: Family Medicine

## 2021-06-29 ENCOUNTER — Encounter: Payer: Self-pay | Admitting: Family Medicine

## 2021-06-29 VITALS — BP 116/68 | HR 63 | Temp 98.2°F | Resp 16 | Ht 69.0 in | Wt 206.2 lb

## 2021-06-29 DIAGNOSIS — E1165 Type 2 diabetes mellitus with hyperglycemia: Secondary | ICD-10-CM | POA: Diagnosis not present

## 2021-06-29 DIAGNOSIS — M1711 Unilateral primary osteoarthritis, right knee: Secondary | ICD-10-CM

## 2021-06-29 DIAGNOSIS — N184 Chronic kidney disease, stage 4 (severe): Secondary | ICD-10-CM

## 2021-06-29 DIAGNOSIS — E785 Hyperlipidemia, unspecified: Secondary | ICD-10-CM | POA: Diagnosis not present

## 2021-06-29 DIAGNOSIS — L309 Dermatitis, unspecified: Secondary | ICD-10-CM | POA: Diagnosis not present

## 2021-06-29 DIAGNOSIS — E669 Obesity, unspecified: Secondary | ICD-10-CM | POA: Diagnosis not present

## 2021-06-29 DIAGNOSIS — E034 Atrophy of thyroid (acquired): Secondary | ICD-10-CM

## 2021-06-29 DIAGNOSIS — I10 Essential (primary) hypertension: Secondary | ICD-10-CM | POA: Diagnosis not present

## 2021-06-29 DIAGNOSIS — E559 Vitamin D deficiency, unspecified: Secondary | ICD-10-CM

## 2021-06-29 DIAGNOSIS — M25561 Pain in right knee: Secondary | ICD-10-CM

## 2021-06-29 DIAGNOSIS — F418 Other specified anxiety disorders: Secondary | ICD-10-CM

## 2021-06-29 DIAGNOSIS — G3184 Mild cognitive impairment, so stated: Secondary | ICD-10-CM

## 2021-06-29 LAB — CBC
HCT: 38.2 % — ABNORMAL LOW (ref 39.0–52.0)
Hemoglobin: 13 g/dL (ref 13.0–17.0)
MCHC: 34.1 g/dL (ref 30.0–36.0)
MCV: 90 fl (ref 78.0–100.0)
Platelets: 196 10*3/uL (ref 150.0–400.0)
RBC: 4.24 Mil/uL (ref 4.22–5.81)
RDW: 14.2 % (ref 11.5–15.5)
WBC: 7.3 10*3/uL (ref 4.0–10.5)

## 2021-06-29 LAB — LIPID PANEL
Cholesterol: 101 mg/dL (ref 0–200)
HDL: 31.4 mg/dL — ABNORMAL LOW (ref >39.00)
LDL Cholesterol: 46 mg/dL (ref 0–99)
NonHDL: 69.25
Total CHOL/HDL Ratio: 3
Triglycerides: 118 mg/dL (ref 0.0–149.0)
VLDL: 23.6 mg/dL (ref 0.0–40.0)

## 2021-06-29 LAB — COMPREHENSIVE METABOLIC PANEL
ALT: 21 U/L (ref 0–53)
AST: 25 U/L (ref 0–37)
Albumin: 4.5 g/dL (ref 3.5–5.2)
Alkaline Phosphatase: 48 U/L (ref 39–117)
BUN: 24 mg/dL — ABNORMAL HIGH (ref 6–23)
CO2: 27 mEq/L (ref 19–32)
Calcium: 10 mg/dL (ref 8.4–10.5)
Chloride: 103 mEq/L (ref 96–112)
Creatinine, Ser: 1.7 mg/dL — ABNORMAL HIGH (ref 0.40–1.50)
GFR: 39.7 mL/min — ABNORMAL LOW (ref >60.00)
Glucose, Bld: 125 mg/dL — ABNORMAL HIGH (ref 70–99)
Potassium: 4.1 mEq/L (ref 3.5–5.1)
Sodium: 138 mEq/L (ref 135–145)
Total Bilirubin: 0.7 mg/dL (ref 0.2–1.2)
Total Protein: 7 g/dL (ref 6.0–8.3)

## 2021-06-29 LAB — TSH: TSH: 3.42 u[IU]/mL (ref 0.35–5.50)

## 2021-06-29 MED ORDER — HYDROXYZINE HCL 10 MG PO TABS
10.0000 mg | ORAL_TABLET | Freq: Three times a day (TID) | ORAL | 1 refills | Status: DC | PRN
  Filled 2021-06-29: qty 30, 10d supply, fill #0
  Filled 2021-08-19: qty 30, 10d supply, fill #1

## 2021-06-29 NOTE — Patient Instructions (Addendum)
Add Vitamin D 1000 IU daily and continue weekly for now  Recommend calcium intake of 1200 to 1500 mg daily, divided into roughly 3 doses. Best source is the diet and a single dairy serving is about 500 mg, a supplement of calcium citrate once or twice daily to balance diet is fine if not getting enough in diet. Also need Vitamin D 2000 IU caps, 1 cap daily if not already taking vitamin D. Also recommend weight baring exercise on hips and upper body to keep bones strong.   Paxlovid or Molnupiravir are the new COVID medications we can give you if you get COVID so make sure you test if you have symptoms because we have to treat by day 5 of symptoms for it to be effective. If you are positive let us know so we can treat. If a home test is negative and your symptoms are persistent get a PCR test. Can check testing locations at [email or web address] If you are positive we will make an appointment with [place] and we will send in Paxlovid if you would like it. Check with your pharmacy before we meet to confirm they have it in stock, if they do not then we can get the prescription at the [facility]   Mild Neurocognitive Disorder Mild neurocognitive disorder, formerly known as mild cognitive impairment, is a disorder in which memory does not work as well as it should. This disorder may also cause problems with other mental functions, including thought, communication, behavior, and completion of tasks. These problems can be noticed and measured, but they usually do not interfere with daily activities or theability to live independently. Mild neurocognitive disorder typically develops after 73 years of age, but it can also develop at younger ages. It is not as serious as major neurocognitive disorder, also known as dementia, but it may be the first sign of it. Generally, symptoms of this condition get worse over time. In rare cases,symptoms can get better. What are the causes? This condition may be caused  by: Brain disorders like Alzheimer's disease, Parkinson's disease, and other conditions that gradually damage nerve cells (neurodegenerative conditions). Diseases that affect blood vessels in the brain and result in small strokes. Certain infections, such as HIV. Traumatic brain injury. Other medical conditions, such as brain tumors, underactive thyroid (hypothyroidism), and vitamin B12 deficiency. Use of certain drugs or prescription medicines. What increases the risk? The following factors may make you more likely to develop this condition: Being older than 65 years. Being male. Low education level. Diabetes, high blood pressure, high cholesterol, and other conditions that increase the risk for blood vessel diseases. Untreated or undertreated sleep apnea. Having a certain type of gene that can be passed from parent to child (inherited). Chronic health problems such as heart disease, lung disease, liver disease, kidney disease, or depression. What are the signs or symptoms? Symptoms of this condition include: Difficulty remembering. You may: Forget names, phone numbers, or details of recent events. Forget social events and appointments. Repeatedly forget where you put your car keys or other items. Difficulty thinking and solving problems. You may have trouble with complex tasks, such as: Paying bills. Driving in unfamiliar places. Difficulty communicating. You may have trouble: Finding the right word or naming an object. Forming a sentence that makes sense, or understanding what you read or hear. Changes in your behavior or personality. When this happens, you may: Lose interest in the things that you used to enjoy. Withdraw from social situations. Get angry  more easily than usual. Act before thinking. How is this diagnosed? This condition is diagnosed based on: Your symptoms. Your health care provider may ask you and the people you spend time with, such as family and friends,  about your symptoms. Evaluation of mental functions (neuropsychological testing). Your health care provider may refer you to a neurologist or mental health specialist to evaluate your mental functions in detail. To identify the cause of your condition, your health care provider may: Get a detailed medical history. Ask about use of alcohol, drugs, and prescription medicines. Do a physical exam. Order blood tests and brain imaging exams. How is this treated? Mild neurocognitive disorder that is caused by medicine use, drug use, infection, or another medical condition may improve when the cause is treated, or when medicines or drugs are stopped. If this disorder has another cause, it generally does not improve and may get worse. In these cases, the goal of treatment is to help you manage the loss of mental function. Treatments in these cases include: Medicine. Medicine mainly helps memory and behavior symptoms. Talk therapy. Talk therapy provides education, emotional support, memory aids, and other ways of making up for problems with mental function. Lifestyle changes, including: Getting regular exercise. Eating a healthy diet that includes omega-3 fatty acids. Challenging your thinking and memory skills. Having more social interaction. Follow these instructions at home: Eating and drinking  Drink enough fluid to keep your urine pale yellow. Eat a healthy diet that includes omega-3 fatty acids. These can be found in: Fish. Nuts. Leafy vegetables. Vegetable oils. If you drink alcohol: Limit how much you use to: 0-1 drink a day for women. 0-2 drinks a day for men. Be aware of how much alcohol is in your drink. In the [place] one drink equals one 12 oz bottle of beer (355 mL), one 5 oz glass of wine (148 mL), or one 1 oz glass of hard liquor (44 mL).  Lifestyle  Get regular exercise as told by your health care provider. Do not use any products that contain nicotine or tobacco, such as  cigarettes, e-cigarettes, and chewing tobacco. If you need help quitting, ask your health care provider. Practice ways to manage stress. If you need help managing stress, ask your health care provider. Continue to have social interaction. Keep your mind active with stimulating activities you enjoy, such as reading or playing games. Make sure to get quality sleep. Follow these tips: Avoid napping during the day. Keep your sleeping area dark and cool. Avoid exercising during the few hours before you go to bed. Avoid caffeine products in the evening.  General instructions Take over-the-counter and prescription medicines only as told by your health care provider. Your health care provider may recommend that you avoid taking medicines that can affect thinking, such as pain medicines or sleep medicines. Work with your health care provider to find out what you need help with and what your safety needs are. Keep all follow-up visits. This is important. Where to find more information [employer] on Aging: http://kim-miller.com/ Contact a health care provider if: You have any new symptoms. Get help right away if: You develop new confusion or your confusion gets worse. You act in ways that place you or your family in danger. Summary Mild neurocognitive disorder is a disorder in which memory does not work as well as it should. Mild neurocognitive disorder can have many causes. It may be the first stage of dementia. To manage your condition, get regular exercise,  keep your mind active, get quality sleep, and eat a healthy diet. This information is not intended to replace advice given to you by your health care provider. Make sure you discuss any questions you have with your healthcare provider. Document Revised: 04/07/2020 Document Reviewed: 04/07/2020 Elsevier Patient Education  [place].

## 2021-06-29 NOTE — Assessment & Plan Note (Signed)
Referred to [facility] for further consideration. Hydrate and monitor

## 2021-06-29 NOTE — Progress Notes (Signed)
Subjective:   By signing my name below, I, [name] [name], attest that this documentation has been prepared under the direction and in the presence of [name] Lukes, MD 06/29/2021   Patient ID: [name] [name], male    DOB: 08-23-1946, 75 y.o.   MRN: [id number]  Chief Complaint  Patient presents with   pre- op clearance    HPI Patient is in today for an office visit.  He will be having a right total knee replacement with Dr Rhona Raider on the 16th of August 2022 and needs a surgical clearance. He reports that a week ago, he developed an itchy rash on his trunk and legs. He uses some OTC diabetes lotion to manage the rash which only relieves the itching for a short time. He mentions he is the only one in his house having these symptoms. He also has some bumps on his head. He gets chills 2-3 times a week and it has not worsened or improved.He denies leg swelling, chest pains and urinary problems. He manages his diabetes with insulin and reports that levels tend to range from 150-200 when he takes his medication in the morning. It went up to 400 when he ate something he was not supposed to. He has been told that he has narcolepsy. He has 4 Covid-19 vaccinations at this time.  He is due to see his nephrologist for his clearance and has been cleared by his cardiologist.    Past Medical History:  Diagnosis Date   Acquired atrophy of thyroid 06/05/2013   Overview:  July 2014: controlled on synthroid 74mcg since 2012 May 2015: decreased to Synthroid 50 mcg  Last Assessment & Plan:  Pt doing well with most recent TSH within normal range.  Will continue current dosage of Synthroid 29mcg. Pt reminded to take medication on empty stomach. Will continue to monitor with periodic laboratory assessment in 3 months.    Anemia    hemoglobin 7.4, iron deficiency, January, 2011, 2 unit transfusion, endoscopy normal, capsule endoscopy February, 2011 no small bowel abnormalities.   Most likely source gastric  erosions, followed by GI   Anxiety    Bradycardia    CAD (coronary artery disease)    A. CABG in 2000,status post cardiac cath in 2006, 2009 ....continued chest pain and SOB despite oral medication adjestments including Ranexa. B. Cath November 2009/ mRCA - 2.75 x 23 Abbott Xience V drug-eluting stent ...11/26/2008 to distal  RCA leading to acute marginal.  C. Cath 07/2012 for CP - stable anatomy, med rx. d. cath 2015 and 05/30/2015 stable anatomy, consider Myoview if has CP again   Carotid artery disease ([place]) 08/01/2015   Doppler, May 29, 2015, 1-39% bilateral ICA    Cerebral ischemia    MRI November, 2010, chronic microvascular ischemia   CKD (chronic kidney disease), stage III ([place])    Concussion    Depression    Bipolar   Diabetes mellitus ([place])    Edema    Essential hypertension 06/05/2013   Overview:  July 2014: Controlled with Bystolic Sep 7829: Changed to atenolol.  Last Assessment & Plan:  Will change to atenolol for cost and follow Improved.  Medication compliance strongly encouraged BP: 116/64 mmHg    Overview:  July 2014: Controlled with Bystolic Sep 5621: Changed to atenolol.  Last Assessment & Plan:  Will change to atenolol for cost and follow   Falling episodes    these have occurred in the past and again recurring 2011   Family  history of adverse reaction to anesthesia    "mother died during bypass surgery but not sure if it has to do with anesthesia"   Gastric ulcer    GERD (gastroesophageal reflux disease)    H/O medication noncompliance    Due to loss of insurance   H/O multiple concussions    Hard of hearing    History of blood transfusion 12/20/2013   Hx of CABG    2000,  / one median sternotomy suture broken her chest x-ray November, 2010, no clinical significance   Hyperlipidemia    Hypertension    pt. denies   Iron deficiency anemia    Long term (current) use of anticoagulants [Z79.01] 07/20/2016   Low back pain 06/12/2009   Qualifier: Diagnosis of  By: [name]    Nephrolithiasis    Orthostasis    OSA (obstructive sleep apnea)    PAF (paroxysmal atrial fibrillation) ([place])    a. dx 2017, started on amiodarone/warfarin but patient intermittently noncompliant.   PSVT (paroxysmal supraventricular tachycardia) ([place])    RBBB 07/09/2009   Qualifier: Diagnosis of  By: [name], MD, [facility], [name]    RLS (restless legs syndrome) 09/19/2009   Qualifier: Diagnosis of  By: [name]   Overview:  July 2014: Controlled with Mirapex  Last Assessment & Plan:  Patient is doing well. Will continue current management and follow clinically.   Seizure disorder ([place]) 01/09/2017   Spondylosis    C5-6, C6-7 MRI 2010   Syncope 03/2016   Thyroid disease    Tubulovillous adenoma of colon 2007   Vitamin D deficiency 05/17/2017   Wears glasses     Past Surgical History:  Procedure Laterality Date   ANTERIOR CERVICAL DECOMP/DISCECTOMY FUSION N/A 05/31/2016   Procedure: ANTERIOR CERVICAL DECOMPRESSION/DISCECTOMY FUSION CERVICAL FIVE-SIX,CERVICAL SIX-SEVEN;  Surgeon: [name], MD;  Location: [place] [facility];  Service: Neurosurgery;  Laterality: N/A;   CARDIAC CATHETERIZATION N/A 05/30/2015   Procedure: Left Heart Cath and Coronary Angiography;  Surgeon: [name], MD;  Location: [place] [facility];  Service: Cardiovascular;  Laterality: N/A;   COLONOSCOPY     CORONARY ARTERY BYPASS GRAFT     2000   ESOPHAGOGASTRODUODENOSCOPY     LEFT HEART CATH AND CORS/GRAFTS ANGIOGRAPHY N/A 12/15/2017   Procedure: LEFT HEART CATH AND CORS/GRAFTS ANGIOGRAPHY;  Surgeon: [name], MD;  Location: [place] [facility];  Service: Cardiovascular;  Laterality: N/A;   LEFT HEART CATHETERIZATION WITH CORONARY/GRAFT ANGIOGRAM N/A 08/01/2012   Procedure: LEFT HEART CATHETERIZATION WITH Beatrix Fetters;  Surgeon: [name], MD;  Location: [facility] CATH LAB;  Service: Cardiovascular;  Laterality: N/A;   LEFT HEART CATHETERIZATION WITH CORONARY/GRAFT ANGIOGRAM N/A  01/03/2015   Procedure: LEFT HEART CATHETERIZATION WITH Beatrix Fetters;  Surgeon: [name], MD;  Location: [facility] CATH LAB;  Service: Cardiovascular;  Laterality: N/A;   NASAL SEPTUM SURGERY     UP3   PERCUTANEOUS CORONARY STENT INTERVENTION (PCI-S)  10/2008   mRCA PCI  2.75 x 23 Abbott Xience V drug-eluting stent    RIGHT/LEFT HEART CATH AND CORONARY/GRAFT ANGIOGRAPHY N/A 01/07/2020   Procedure: RIGHT/LEFT HEART CATH AND CORONARY/GRAFT ANGIOGRAPHY;  Surgeon: [name], MD;  Location: [place] [facility];  Service: Cardiovascular;  Laterality: N/A;   ULTRASOUND GUIDANCE FOR VASCULAR ACCESS  12/15/2017   Procedure: Ultrasound Guidance For Vascular Access;  Surgeon: [name], MD;  Location: [place] [facility];  Service: Cardiovascular;;    Family  History  Problem Relation Age of Onset   Pancreatic cancer Brother    Diabetes Brother    Coronary artery disease Brother    Stroke Brother    Diabetes Brother    Diabetes Mother    Heart failure Mother    Heart failure Father    Hypothyroidism Brother    Coronary artery disease Brother    Other Brother        colon surgery   Heart attack Other        Nephew   Irregular heart beat Daughter    Cancer Maternal Grandmother        unknown    Colon cancer Neg Hx    Stomach cancer Neg Hx    Liver cancer Neg Hx    Rectal cancer Neg Hx    Esophageal cancer Neg Hx     Social History   Socioeconomic History   Marital status: Married    Spouse name: Not on file   Number of children: 4   Years of education: 13   Highest education level: Not on file  Occupational History   Occupation: retired  Tobacco Use   Smoking status: Never   Smokeless tobacco: Never  Vaping Use   Vaping Use: Never used  Substance and Sexual Activity   Alcohol use: No    Alcohol/week: 0.0 standard drinks    Comment: stopped drinking in 1998   Drug use: No   Sexual activity: Not on file  Other Topics Concern   Not on file  Social  History Narrative   Patient is right handed.   Patient does not drink caffeine.   Social Determinants of Health   Financial Resource Strain: Not on file  Food Insecurity: Not on file  Transportation Needs: Not on file  Physical Activity: Not on file  Stress: Not on file  Social Connections: Not on file  Intimate Partner Violence: Not on file    Outpatient Medications Prior to Visit  Medication Sig Dispense Refill   albuterol (VENTOLIN HFA) 108 (90 Base) MCG/ACT inhaler Inhale 2 puffs into the lungs every 6 (six) hours as needed for wheezing or shortness of breath. 18 g 2   amLODipine (NORVASC) 5 MG tablet TAKE 1 TABLET BY MOUTH ONCE DAILY 90 tablet 2   apixaban (ELIQUIS) 5 MG TABS tablet Take 1 tablet (5 mg total) by mouth 2 (two) times daily. 60 tablet 5   atorvastatin (LIPITOR) 80 MG tablet Take 1 tablet (80 mg total) by mouth daily. (Patient taking differently: Take 80 mg by mouth at bedtime.) 90 tablet 3   busPIRone (BUSPAR) 7.5 MG tablet Take 1 tablet (7.5 mg total) by mouth 2 (two) times daily. 60 tablet 1   cetirizine (ZYRTEC) 10 MG tablet TAKE 1 TABLET (10 MG TOTAL) BY MOUTH DAILY. 100 tablet 2   Continuous Blood Gluc Sensor (FREESTYLE LIBRE 14 DAY SENSOR) MISC USE TO CHECK BLOOD SUGAR AND CHANGE EVERY 14 DAYS 6 each 3   Diclofenac Sodium (PENNSAID) 2 % SOLN Place 1 application onto the skin 2 (two) times daily. (Patient taking differently: Place 1 application onto the skin 2 (two) times daily as needed (pain).) 112 g 2   famotidine (PEPCID) 20 MG tablet TAKE 1 TABLET BY MOUTH 2 TIMES DAILY 60 tablet 2   fenofibrate (TRICOR) 145 MG tablet TAKE 1 TABLET (145 MG TOTAL) BY MOUTH DAILY. 90 tablet 1   furosemide (LASIX) 20 MG tablet TAKE 1 TABLET BY MOUTH DAILY AS NEEDED FOR  EDEMA 30 tablet 3   glucose blood (ACCU-CHEK AVIVA) test strip Use as directed to check blood sugar 4 times daily.  DX E11.9 200 each 6   HYDROcodone-acetaminophen (NORCO/VICODIN) 5-325 MG tablet TAKE 1 TABLET BY  MOUTH EVERY 8 (EIGHT) HOURS AS NEEDED. (Patient not taking: No sig reported) 15 tablet 0   hydrocortisone 2.5 % cream Apply on to the skin 2 (two) times daily. 30 g 1   insulin glargine, 2 Unit Dial, (TOUJEO MAX SOLOSTAR) 300 UNIT/ML Solostar Pen Inject 130 Units into the skin every morning. 48 mL 3   Insulin Pen Needle 31G X 8 MM MISC use to inject insulin twice a day 100 each 3   Insulin Syringe-Needle U-100 31G X 5/16" 1 ML MISC USE AS DIRECTED WITH NOVOLIN 100 each 0   isosorbide mononitrate (IMDUR) 120 MG 24 hr tablet TAKE 1 TABLET (120 MG TOTAL) BY MOUTH DAILY. 90 tablet 3   levothyroxine (SYNTHROID) 100 MCG tablet TAKE 1 TABLET (100 MCG TOTAL) BY MOUTH DAILY. 90 tablet 1   metoprolol succinate (TOPROL-XL) 50 MG 24 hr tablet TAKE 1 AND 1/2 TABLETS EVERY DAY FOR A TOTAL OF 75 MG 135 tablet 3   nitroGLYCERIN (NITROSTAT) 0.4 MG SL tablet PLACE 1 TABLET UNDER THE TONGUE EVERY 5 MINUTES AS NEEDED FOR CHEST PAIN. 25 tablet 1   pramipexole (MIRAPEX) 1 MG tablet TAKE 1/2 TO 1 TABLET AT BEDTIME (Patient taking differently: Take 1 mg by mouth at bedtime.) 90 tablet 0   ranolazine (RANEXA) 500 MG 12 hr tablet TAKE 1 TABLET BY MOUTH TWICE DAILY 60 tablet 5   sertraline (ZOLOFT) 100 MG tablet TAKE 1 TABLET BY MOUTH TWICE DAILY 180 tablet 1   tiZANidine (ZANAFLEX) 4 MG tablet TAKE 1 TABLET BY MOUTH EVERY 6 HOURS AS NEEDED FOR MUSCLE SPASMS 40 tablet 2   triamcinolone cream (KENALOG) 0.1 % Apply 1 application topically 2 (two) times daily. Use as needed for itchy rash (Patient not taking: No sig reported) 30 g 1   Vitamin D, Ergocalciferol, (DRISDOL) 1.25 MG (50000 UNIT) CAPS capsule TAKE 1 CAPSULE BY MOUTH EVERY 7 DAYS 4 capsule 4   pantoprazole (PROTONIX) 40 MG tablet TAKE 1 TABLET EVERY DAY 90 tablet 3   No facility-administered medications prior to visit.    Allergies  Allergen Reactions   Morphine Other (See Comments)    Hallucinations   Shellfish-Derived Products Itching    Review of Systems   Musculoskeletal:  Positive for joint pain.  Skin:  Positive for itching and rash.  Psychiatric/Behavioral:  Positive for memory loss.       Objective:    Physical Exam Constitutional:      General: He is not in acute distress. HENT:     Head: Normocephalic and atraumatic.     Right Ear: External ear normal.     Left Ear: External ear normal.  Eyes:     Conjunctiva/sclera: Conjunctivae normal.  Cardiovascular:     Rate and Rhythm: Normal rate and regular rhythm.     Heart sounds: Normal heart sounds. No murmur heard. Pulmonary:     Effort: No respiratory distress.     Breath sounds: Normal breath sounds.  Abdominal:     General: Bowel sounds are normal. There is no distension.     Palpations: Abdomen is soft.     Tenderness: There is no abdominal tenderness.  Musculoskeletal:     Cervical back: Neck supple.  Lymphadenopathy:     Cervical: No cervical  adenopathy.  Skin:    General: Skin is warm and dry.  Neurological:     Mental Status: He is alert and oriented to person, place, and time.  Psychiatric:        Behavior: Behavior normal.    BP 116/68   Pulse 63   Temp 98.2 F (36.8 C)   Resp 16   Ht 5\' 9"  (1.753 m)   Wt 206 lb 3.2 oz (93.5 kg)   SpO2 99%   BMI 30.45 kg/m  Wt Readings from Last 3 Encounters:  06/29/21 206 lb 3.2 oz (93.5 kg)  06/16/21 205 lb (93 kg)  06/05/21 207 lb 4 oz (94 kg)    Diabetic Foot Exam - Simple   No data filed    Lab Results  Component Value Date   WBC 7.3 06/29/2021   HGB 13.0 06/29/2021   HCT 38.2 (L) 06/29/2021   PLT 196.0 06/29/2021   GLUCOSE 125 (H) 06/29/2021   CHOL 101 06/29/2021   TRIG 118.0 06/29/2021   HDL 31.40 (L) 06/29/2021   LDLDIRECT 77.0 08/20/2019   LDLCALC 46 06/29/2021   ALT 21 06/29/2021   AST 25 06/29/2021   NA 138 06/29/2021   K 4.1 06/29/2021   CL 103 06/29/2021   CREATININE 1.70 (H) 06/29/2021   BUN 24 (H) 06/29/2021   CO2 27 06/29/2021   TSH 3.42 06/29/2021   PSA 3.14 08/20/2019   INR  1.3 (H) 03/25/2021   HGBA1C 7.2 (A) 06/16/2021   MICROALBUR <0.7 05/14/2019    Lab Results  Component Value Date   TSH 3.42 06/29/2021   Lab Results  Component Value Date   WBC 7.3 06/29/2021   HGB 13.0 06/29/2021   HCT 38.2 (L) 06/29/2021   MCV 90.0 06/29/2021   PLT 196.0 06/29/2021   Lab Results  Component Value Date   NA 138 06/29/2021   K 4.1 06/29/2021   CO2 27 06/29/2021   GLUCOSE 125 (H) 06/29/2021   BUN 24 (H) 06/29/2021   CREATININE 1.70 (H) 06/29/2021   BILITOT 0.7 06/29/2021   ALKPHOS 48 06/29/2021   AST 25 06/29/2021   ALT 21 06/29/2021   PROT 7.0 06/29/2021   ALBUMIN 4.5 06/29/2021   CALCIUM 10.0 06/29/2021   ANIONGAP 7 05/09/2021   GFR 39.70 (L) 06/29/2021   Lab Results  Component Value Date   CHOL 101 06/29/2021   Lab Results  Component Value Date   HDL 31.40 (L) 06/29/2021   Lab Results  Component Value Date   LDLCALC 46 06/29/2021   Lab Results  Component Value Date   TRIG 118.0 06/29/2021   Lab Results  Component Value Date   CHOLHDL 3 06/29/2021   Lab Results  Component Value Date   HGBA1C 7.2 (A) 06/16/2021       Assessment & Plan:   Problem List Items Addressed This Visit     Diabetes mellitus type II, uncontrolled ([place]) (Chronic)    hgba1c now 7.2. Tuojeo 130 units in am. Improving. Lowest in past 2 weeks of 45, highest after eating something he should not eat it spiked to 400. Prior to eating and am insulin is 150 to 200 roughly       Depression with anxiety    Continue Sertraline and allowed Hydroxyzine to use prn for anxiety and pruruitus       Relevant Medications   hydrOXYzine (ATARAX/VISTARIL) 10 MG tablet   Hyperlipidemia LDL goal <70 - Primary   Relevant Orders   Lipid  panel (Completed)   Essential hypertension    Well controlled, no changes to meds. Encouraged heart healthy diet such as the DASH diet and exercise as tolerated.        Relevant Orders   CBC (Completed)   Comprehensive metabolic panel  (Completed)   TSH (Completed)   Dermatitis    1 week worth of itchy rash on trunk improves with bath and lotion but recurs.        OA (osteoarthritis) of knee    He is scheduled to have a right TKR with Dr Novella Olive on July 21, 2021. He is anxious to proceed due to his level or pain and debility. He has been cleared by cardiology and is cleared medically today.       Vitamin D deficiency    Supplement and monitor       Hypothyroidism due to acquired atrophy of thyroid    On Levothyroxine, continue to monitor       Recurrent pain of right knee    Has surgery scheduled with Dr Novella Olive for Right TKR on 07/21/21. He is anxious to proceed due to pain, he is medically cl       Obesity    Encouraged DASH or MIND diet, decrease po intake and increase exercise as tolerated. Needs 7-8 hours of sleep nightly. Avoid trans fats, eat small, frequent meals every 4-5 hours with lean proteins, complex carbs and healthy fats. Minimize simple carbs, high fat foods and processed foods       MCI (mild cognitive impairment)    He has an appointment with neurology soon to address memory and headache concerns.        CKD (chronic kidney disease), stage IV ([place])    Referred to [place] [facility] for further consideration. Hydrate and monitor       Relevant Orders   Ambulatory referral to Nephrology      Meds ordered this encounter  Medications   hydrOXYzine (ATARAX/VISTARIL) 10 MG tablet    Sig: Take 1 tablet (10 mg total) by mouth 3 (three) times daily as needed for anxiety or itching.    Dispense:  30 tablet    Refill:  1    I, [name] Lukes, MD , personally preformed the services described in this documentation.  All medical record entries made by the scribe were at my direction and in my presence.  I have reviewed the chart and discharge instructions (if applicable) and agree that the record reflects my personal performance and is accurate and complete. 06/29/2021   I,[name]  [name],acting as a scribe for Penni Homans, MD.,have documented all relevant documentation on the behalf of Penni Homans, MD,as directed by  Penni Homans, MD while in the presence of Penni Homans, MD.    Penni Homans, MD

## 2021-06-29 NOTE — Assessment & Plan Note (Signed)
Well controlled, no changes to meds. Encouraged heart healthy diet such as the DASH diet and exercise as tolerated.  °

## 2021-06-29 NOTE — Assessment & Plan Note (Addendum)
Has surgery scheduled with Dr Novella Olive for Right TKR on 07/21/21. He is anxious to proceed due to pain, he is medically cl

## 2021-06-29 NOTE — Assessment & Plan Note (Signed)
1 week worth of itchy rash on trunk improves with bath and lotion but recurs.

## 2021-06-29 NOTE — Assessment & Plan Note (Signed)
hgba1c now 7.2. Tuojeo 130 units in am. Improving. Lowest in past 2 weeks of 45, highest after eating something he should not eat it spiked to 400. Prior to eating and am insulin is 150 to 200 roughly

## 2021-06-30 ENCOUNTER — Other Ambulatory Visit: Payer: Self-pay | Admitting: Family Medicine

## 2021-06-30 ENCOUNTER — Other Ambulatory Visit (HOSPITAL_BASED_OUTPATIENT_CLINIC_OR_DEPARTMENT_OTHER): Payer: Self-pay

## 2021-06-30 MED ORDER — PANTOPRAZOLE SODIUM 40 MG PO TBEC
40.0000 mg | DELAYED_RELEASE_TABLET | Freq: Every day | ORAL | 3 refills | Status: DC
  Filled 2021-06-30: qty 90, 90d supply, fill #0
  Filled 2022-02-24: qty 90, 90d supply, fill #1
  Filled 2022-05-27: qty 90, 90d supply, fill #2

## 2021-07-05 NOTE — Assessment & Plan Note (Signed)
Supplement and monitor 

## 2021-07-05 NOTE — Assessment & Plan Note (Signed)
Continue Sertraline and allowed Hydroxyzine to use prn for anxiety and pruruitus

## 2021-07-05 NOTE — Assessment & Plan Note (Signed)
On Levothyroxine, continue to monitor 

## 2021-07-05 NOTE — Assessment & Plan Note (Signed)
Encouraged DASH or MIND diet, decrease po intake and increase exercise as tolerated. Needs 7-8 hours of sleep nightly. Avoid trans fats, eat small, frequent meals every 4-5 hours with lean proteins, complex carbs and healthy fats. Minimize simple carbs, high fat foods and processed foods 

## 2021-07-05 NOTE — Assessment & Plan Note (Signed)
He has an appointment with neurology soon to address memory and headache concerns.

## 2021-07-05 NOTE — Assessment & Plan Note (Signed)
He is scheduled to have a right TKR with Dr Novella Olive on July 21, 2021. He is anxious to proceed due to his level or pain and debility. He has been cleared by cardiology and is cleared medically today.

## 2021-07-06 ENCOUNTER — Other Ambulatory Visit (HOSPITAL_BASED_OUTPATIENT_CLINIC_OR_DEPARTMENT_OTHER): Payer: Self-pay

## 2021-07-07 ENCOUNTER — Ambulatory Visit: Payer: Medicare Other | Admitting: Neurology

## 2021-07-07 ENCOUNTER — Other Ambulatory Visit (HOSPITAL_BASED_OUTPATIENT_CLINIC_OR_DEPARTMENT_OTHER): Payer: Self-pay

## 2021-07-07 ENCOUNTER — Encounter: Payer: Self-pay | Admitting: Neurology

## 2021-07-07 ENCOUNTER — Other Ambulatory Visit: Payer: Self-pay

## 2021-07-07 VITALS — BP 139/77 | HR 63 | Ht 69.0 in | Wt 206.4 lb

## 2021-07-07 DIAGNOSIS — E538 Deficiency of other specified B group vitamins: Secondary | ICD-10-CM | POA: Diagnosis not present

## 2021-07-07 DIAGNOSIS — G3184 Mild cognitive impairment, so stated: Secondary | ICD-10-CM

## 2021-07-07 MED ORDER — DIVALPROEX SODIUM 500 MG PO DR TAB
500.0000 mg | DELAYED_RELEASE_TABLET | Freq: Two times a day (BID) | ORAL | 5 refills | Status: DC
  Filled 2021-07-07: qty 60, 30d supply, fill #0
  Filled 2021-08-19: qty 60, 30d supply, fill #1

## 2021-07-07 NOTE — Progress Notes (Signed)
Reason for visit: Memory disturbance, headache, history of seizures  Referring physician: Dr. Kipp Laurence [name] [name] is a 75 y.o. male  History of present illness:  [name] is a 75 year old right-handed white male with a history of mild memory changes that date back several years.  The patient was seen and evaluated by Dr. Tomi [name] in 2016 for some cognitive changes felt associated with prior concussions.  The patient underwent neuropsychological evaluation in 2018, the results suggest that the patient did not have an organic dementia at that time.  The patient has had some history of seizure type events, he was on Depakote for a number of years, but sometime since 2020, he went off the Depakote.  The patient now reports that he is having headaches with some regularity, at least 1 or 2 headaches a week.  The patient has not had any recent seizures.  He does have a history of diabetes, his most recent hemoglobin A1c was 7.2.  He reports that he chronically has insomnia, he will go to bed around 10:30 PM or 11 PM and wakes up around 330 or 4 AM and cannot get back to sleep.  He has drowsiness during the day, he will oftentimes take naps.  He has chronic fatigue.  He reports some short-term memory, he cannot remember recent events.  He still drives a car but occasionally he will get lost.  He is able to keep up with his medications and appointments, he does some of the finances but his wife does most of them.  He does report some gait instability, he has significant right knee arthritis and will be having knee surgery for total knee replacement next week.  He denies any significant numbness of the feet.  He denies any focal weakness.  A recent CT scan of the brain does show some evidence of cerebrovascular disease including a left pontine stroke event.  He comes to this office for further evaluation.  Past Medical History:  Diagnosis Date   Acquired atrophy of thyroid 06/05/2013   Overview:   July 2014: controlled on synthroid 31mcg since 2012 May 2015: decreased to Synthroid 50 mcg  Last Assessment & Plan:  Pt doing well with most recent TSH within normal range.  Will continue current dosage of Synthroid 69mcg. Pt reminded to take medication on empty stomach. Will continue to monitor with periodic laboratory assessment in 3 months.    Anemia    hemoglobin 7.4, iron deficiency, January, 2011, 2 unit transfusion, endoscopy normal, capsule endoscopy February, 2011 no small bowel abnormalities.   Most likely source gastric erosions, followed by GI   Anxiety    Bradycardia    CAD (coronary artery disease)    A. CABG in 2000,status post cardiac cath in 2006, 2009 ....continued chest pain and SOB despite oral medication adjestments including Ranexa. B. Cath November 2009/ mRCA - 2.75 x 23 Abbott Xience V drug-eluting stent ...11/26/2008 to distal  RCA leading to acute marginal.  C. Cath 07/2012 for CP - stable anatomy, med rx. d. cath 2015 and 05/30/2015 stable anatomy, consider Myoview if has CP again   Carotid artery disease ([place]) 08/01/2015   Doppler, May 29, 2015, 1-39% bilateral ICA    Cerebral ischemia    MRI November, 2010, chronic microvascular ischemia   CKD (chronic kidney disease), stage III (HCC)    Concussion    Depression    Bipolar   Diabetes mellitus ([name] [name])    Edema    Essential hypertension  06/05/2013   Overview:  July 2014: Controlled with Bystolic Sep 6160: Changed to atenolol.  Last Assessment & Plan:  Will change to atenolol for cost and follow Improved.  Medication compliance strongly encouraged BP: 116/64 mmHg    Overview:  July 2014: Controlled with Bystolic Sep 7371: Changed to atenolol.  Last Assessment & Plan:  Will change to atenolol for cost and follow   Falling episodes    these have occurred in the past and again recurring 2011   Family history of adverse reaction to anesthesia    "mother died during bypass surgery but not sure if it has to do with anesthesia"    Gastric ulcer    GERD (gastroesophageal reflux disease)    H/O medication noncompliance    Due to loss of insurance   H/O multiple concussions    Hard of hearing    History of blood transfusion 12/20/2013   Hx of CABG    2000,  / one median sternotomy suture broken her chest x-ray November, 2010, no clinical significance   Hyperlipidemia    Hypertension    pt. denies   Iron deficiency anemia    Long term (current) use of anticoagulants [Z79.01] 07/20/2016   Low back pain 06/12/2009   Qualifier: Diagnosis of  By: [name]    Nephrolithiasis    Orthostasis    OSA (obstructive sleep apnea)    PAF (paroxysmal atrial fibrillation) ([name])    a. dx 2017, started on amiodarone/warfarin but patient intermittently noncompliant.   PSVT (paroxysmal supraventricular tachycardia) ([place])    RBBB 07/09/2009   Qualifier: Diagnosis of  By: [name], MD, [facility], [name]    RLS (restless legs syndrome) 09/19/2009   Qualifier: Diagnosis of  By: [name]   Overview:  July 2014: Controlled with Mirapex  Last Assessment & Plan:  Patient is doing well. Will continue current management and follow clinically.   Seizure disorder ([place]) 01/09/2017   Spondylosis    C5-6, C6-7 MRI 2010   Syncope 03/2016   TBI (traumatic brain injury) ([place]) 2019   Thyroid disease    Tubulovillous adenoma of colon 2007   Vitamin D deficiency 05/17/2017   Wears glasses     Past Surgical History:  Procedure Laterality Date   ANTERIOR CERVICAL DECOMP/DISCECTOMY FUSION N/A 05/31/2016   Procedure: ANTERIOR CERVICAL DECOMPRESSION/DISCECTOMY FUSION CERVICAL FIVE-SIX,CERVICAL SIX-SEVEN;  Surgeon: [name], MD;  Location: [facility];  Service: Neurosurgery;  Laterality: N/A;   CARDIAC CATHETERIZATION N/A 05/30/2015   Procedure: Left Heart Cath and Coronary Angiography;  Surgeon: [name], MD;  Location: [facility];  Service: Cardiovascular;  Laterality: N/A;   COLONOSCOPY     CORONARY ARTERY  BYPASS GRAFT     2000   ESOPHAGOGASTRODUODENOSCOPY     LEFT HEART CATH AND CORS/GRAFTS ANGIOGRAPHY N/A 12/15/2017   Procedure: LEFT HEART CATH AND CORS/GRAFTS ANGIOGRAPHY;  Surgeon: [name], MD;  Location: [facility];  Service: Cardiovascular;  Laterality: N/A;   LEFT HEART CATHETERIZATION WITH CORONARY/GRAFT ANGIOGRAM N/A 08/01/2012   Procedure: LEFT HEART CATHETERIZATION WITH Beatrix Fetters;  Surgeon: [name], MD;  Location: [facility];  Service: Cardiovascular;  Laterality: N/A;   LEFT HEART CATHETERIZATION WITH CORONARY/GRAFT ANGIOGRAM N/A 01/03/2015   Procedure: LEFT HEART CATHETERIZATION WITH Beatrix Fetters;  Surgeon: [name], MD;  Location: [facility];  Service: Cardiovascular;  Laterality: N/A;   NASAL SEPTUM SURGERY     UP3  PERCUTANEOUS CORONARY STENT INTERVENTION (PCI-S)  10/2008   mRCA PCI  2.75 x 23 Abbott Xience V drug-eluting stent    RIGHT/LEFT HEART CATH AND CORONARY/GRAFT ANGIOGRAPHY N/A 01/07/2020   Procedure: RIGHT/LEFT HEART CATH AND CORONARY/GRAFT ANGIOGRAPHY;  Surgeon: [name], MD;  Location: [facility];  Service: Cardiovascular;  Laterality: N/A;   ULTRASOUND GUIDANCE FOR VASCULAR ACCESS  12/15/2017   Procedure: Ultrasound Guidance For Vascular Access;  Surgeon: [name], MD;  Location: [facility];  Service: Cardiovascular;;    Family History  Problem Relation Age of Onset   Pancreatic cancer Brother    Diabetes Brother    Coronary artery disease Brother    Stroke Brother    Diabetes Brother    Diabetes Mother    Heart failure Mother    Heart failure Father    Hypothyroidism Brother    Coronary artery disease Brother    Other Brother        colon surgery   Heart attack Other        Nephew   Irregular heart beat Daughter    Cancer Maternal Grandmother        unknown    Colon cancer Neg Hx    Stomach cancer Neg Hx    Liver cancer Neg Hx    Rectal cancer Neg Hx     Esophageal cancer Neg Hx     Social history:  reports that he has never smoked. He has never used smokeless tobacco. He reports that he does not drink alcohol and does not use drugs.  Medications:  Prior to Admission medications   Medication Sig Start Date End Date Taking? Authorizing Provider  albuterol (VENTOLIN HFA) 108 (90 Base) MCG/ACT inhaler Inhale 2 puffs into the lungs every 6 (six) hours as needed for wheezing or shortness of breath. 06/25/19  Yes [name], MD  amLODipine (NORVASC) 5 MG tablet TAKE 1 TABLET BY MOUTH ONCE DAILY 05/01/21 05/01/22 Yes [name], MD  apixaban (ELIQUIS) 5 MG TABS tablet Take 1 tablet (5 mg total) by mouth 2 (two) times daily. 06/10/21  Yes [name], MD  aspirin EC 81 MG tablet Take 81 mg by mouth daily. Swallow whole.   Yes [provider]  atorvastatin (LIPITOR) 80 MG tablet Take 1 tablet (80 mg total) by mouth daily. Patient taking differently: Take 80 mg by mouth at bedtime. 03/09/21  Yes [name], MD  busPIRone (BUSPAR) 7.5 MG tablet Take 1 tablet (7.5 mg total) by mouth 2 (two) times daily. 04/28/21  Yes [name], MD  cetirizine (ZYRTEC) 10 MG tablet TAKE 1 TABLET (10 MG TOTAL) BY MOUTH DAILY. 03/03/21 03/03/22 Yes [name], MD  Continuous Blood Gluc Sensor (FREESTYLE LIBRE 14 DAY SENSOR) MISC USE TO CHECK BLOOD SUGAR AND CHANGE EVERY 14 DAYS 02/25/21 02/25/22 Yes Renato Shin, MD  Diclofenac Sodium (PENNSAID) 2 % SOLN Place 1 application onto the skin 2 (two) times daily. Patient taking differently: Place 1 application onto the skin 2 (two) times daily as needed (pain). 10/17/20  Yes Rosemarie Ax, MD  famotidine (PEPCID) 20 MG tablet TAKE 1 TABLET BY MOUTH 2 TIMES DAILY 06/23/21 06/23/22 Yes [name], MD  fenofibrate (TRICOR) 145 MG tablet TAKE 1 TABLET (145 MG TOTAL) BY MOUTH DAILY. 06/10/21 06/10/22 Yes [name], MD  furosemide (LASIX) 20 MG tablet TAKE 1 TABLET BY MOUTH DAILY AS NEEDED  FOR EDEMA 01/28/21 01/28/22 Yes Charlett Blake,  Bonnita Levan, MD  glucose blood (ACCU-CHEK AVIVA) test strip Use as directed to check blood sugar 4 times daily.  DX E11.9 02/05/19  Yes [name], MD  HYDROcodone-acetaminophen (NORCO/VICODIN) 5-325 MG tablet TAKE 1 TABLET BY MOUTH EVERY 8 (EIGHT) HOURS AS NEEDED. 02/25/21 08/24/21 Yes Rosemarie Ax, MD  hydrocortisone 2.5 % cream Apply on to the skin 2 (two) times daily. 06/24/21 06/24/22 Yes [name], MD  hydrOXYzine (ATARAX/VISTARIL) 10 MG tablet Take 1 tablet (10 mg total) by mouth 3 (three) times daily as needed for anxiety or itching. 06/29/21  Yes [name], MD  insulin glargine, 2 Unit Dial, (TOUJEO MAX SOLOSTAR) 300 UNIT/ML Solostar Pen Inject 130 Units into the skin every morning. 06/16/21  Yes Renato Shin, MD  Insulin Pen Needle 31G X 8 MM MISC use to inject insulin twice a day 04/03/21  Yes Renato Shin, MD  Insulin Syringe-Needle U-100 31G X 5/16" 1 ML MISC USE AS DIRECTED WITH NOVOLIN 02/24/21 02/24/22 Yes Renato Shin, MD  isosorbide mononitrate (IMDUR) 120 MG 24 hr tablet TAKE 1 TABLET (120 MG TOTAL) BY MOUTH DAILY. 10/23/20 10/23/21 Yes [name], MD  levothyroxine (SYNTHROID) 100 MCG tablet TAKE 1 TABLET (100 MCG TOTAL) BY MOUTH DAILY. 01/13/21 01/13/22 Yes [name], MD  metoprolol succinate (TOPROL-XL) 50 MG 24 hr tablet TAKE 1 AND 1/2 TABLETS EVERY DAY FOR A TOTAL OF 75 MG 11/20/20  Yes [name], MD  nitroGLYCERIN (NITROSTAT) 0.4 MG SL tablet PLACE 1 TABLET UNDER THE TONGUE EVERY 5 MINUTES AS NEEDED FOR CHEST PAIN. 05/20/20  Yes [name], MD  pantoprazole (PROTONIX) 40 MG tablet TAKE 1 TABLET BY MOUTH EVERY DAY 06/30/21  Yes [name], MD  pramipexole (MIRAPEX) 1 MG tablet TAKE 1/2 TO 1 TABLET AT BEDTIME Patient taking differently: Take 1 mg by mouth at bedtime. 04/21/21  Yes [name], MD  ranolazine (RANEXA) 500 MG 12 hr tablet TAKE 1 TABLET BY MOUTH TWICE DAILY 03/02/21 03/02/22 Yes [name], MD  sertraline (ZOLOFT) 100 MG tablet TAKE 1 TABLET BY MOUTH TWICE DAILY 05/01/21 05/01/22 Yes [name], MD  tiZANidine (ZANAFLEX) 4 MG tablet TAKE 1 TABLET BY MOUTH EVERY 6 HOURS AS NEEDED FOR MUSCLE SPASMS 04/29/21 04/29/22 Yes [name], MD  triamcinolone cream (KENALOG) 0.1 % Apply 1 application topically 2 (two) times daily. Use as needed for itchy rash 05/22/21  Yes [name], MD  Vitamin D, Ergocalciferol, (DRISDOL) 1.25 MG (50000 UNIT) CAPS capsule TAKE 1 CAPSULE BY MOUTH EVERY 7 DAYS 05/18/21 05/18/22 Yes [name], MD      Allergies  Allergen Reactions   Morphine Other (See Comments)    Hallucinations   Shellfish-Derived Products Itching    ROS:  Out of a complete 14 system review of symptoms, the patient complains only of the following symptoms, and all other reviewed systems are negative.  Memory problems Right knee pain, difficulty walking Headache History of seizures  Blood pressure 139/77, pulse 63, height 5\' 9"  (1.753 m), weight 206 lb 6.4 oz (93.6 kg).  Physical Exam  General: The patient is alert and cooperative at the time of the examination.  Eyes: Pupils are equal, round, and reactive to light. Discs are flat bilaterally.  Neck: The neck is supple, no carotid bruits are noted.  Respiratory: The respiratory examination is clear.  Cardiovascular: The cardiovascular examination reveals a regular rate and rhythm, no obvious murmurs or rubs  are noted.  Skin: Extremities are without significant edema.  Neurologic Exam  Mental status: The patient is alert and oriented x 3 at the time of the examination. The Mini-Mental status examination done today reveals a total score 22/30.  Patient is able to name 14 four-legged animals in 60 seconds.  Cranial nerves: Facial symmetry is present. There is good sensation of the face to pinprick and soft touch bilaterally. The strength of the facial muscles and the muscles to head turning and  shoulder shrug are normal bilaterally. Speech is well enunciated, no aphasia or dysarthria is noted. Extraocular movements are full. Visual fields are full. The tongue is midline, and the patient has symmetric elevation of the soft palate. No obvious hearing deficits are noted.  Motor: The motor testing reveals 5 over 5 strength of all 4 extremities. Good symmetric motor tone is noted throughout.  Sensory: Sensory testing is intact to pinprick, soft touch, vibration sensation, and position sense on all 4 extremities. No evidence of extinction is noted.  Coordination: Cerebellar testing reveals good finger-nose-finger and heel-to-shin bilaterally.  Gait and station: Gait is associated with a limping type gait on the right leg.  Tandem gait was not attempted.  Romberg is negative.  Reflexes: Deep tendon reflexes are symmetric and normal bilaterally, trace ankle jerk reflexes are seen bilaterally.  Toes are downgoing bilaterally.   CT head 05/08/21:  IMPRESSION: Atrophy and mild chronic microvascular ischemic change. Hypodensity left pons likely due to chronic ischemia. No acute abnormality.   * CT scan images were reviewed online. I agree with the written report.    Assessment/Plan:  1.  Reported memory disturbance  2.  Chronic insomnia, fatigue  3.  History of seizures off medications  4.  History of headache  The patient has had a recent CT of the head that does show some degree of cerebrovascular disease.  The patient reports some daytime drowsiness associated with chronic insomnia.  This may have some impact on his cognitive abilities.  He has noted decline in his short-term memory issues which has been a problem for him for several years.  Given the history of seizure type events, we would probably not consider the use of medication such as Exelon or Aricept.  The patient may be a candidate for Namenda.  He will be placed back on Depakote for his headaches at this point.  We will  follow the memory issues over time, we will check blood work today.  He will follow-up in about 4 to 5 months.  In the future, he can be followed through Dr. Brett Fairy.  Jill Alexanders MD 07/07/2021 10:58 AM  [facility] [address] [place], [place] [address]  Phone [phone] Fax [phone]

## 2021-07-07 NOTE — Patient Instructions (Signed)
We will start Depakote 500 mg twice a day for the headache.  Depakote (valproic acid) is a seizure medication that also has an FDA approval for migraine headache. The most common potential side effects of this medication include weight gain, tremor, or possible stomach upset. This medication can potentially cause liver problems. If confusion is noted on this medication, contact our office immediately.

## 2021-07-07 NOTE — Patient Instructions (Addendum)
DUE TO COVID-19 ONLY ONE VISITOR IS ALLOWED TO COME WITH YOU AND STAY IN THE WAITING ROOM ONLY DURING PRE OP AND PROCEDURE DAY OF SURGERY. THE 2 VISITOR  MAY VISIT WITH YOU AFTER SURGERY IN YOUR PRIVATE ROOM DURING VISITING HOURS ONLY!  YOU NEED TO HAVE A COVID 19 TEST ON__8/12_____ @_______ , THIS TEST MUST BE DONE BEFORE SURGERY,                [name] [name]     Your procedure is scheduled on: 07/21/21   Report to [facility] Main  Entrance   Report to SHORT STAY AT 5:15 AM     Call this number if you have problems the morning of surgery [phone]    BRUSH [place], NO CHEWING GUM CANDY OR MINTS.  No food after midnight.    You may have clear liquid until 4:30 AM.    At 4:00 AM drink pre surgery drink.   Nothing by mouth after 4:30 AM.     Take these medicines the morning of surgery with A SIP OF WATER:Isosorbide, Ranolazine, Synthroid, Pantoprazole, Buspirone, Zoloft, Metoprolol, amlodipine.   USE YOUR INHALER AND BRING IT WITH YOU. BRING YOUR MASK AND TUBING  Stop taking __Eliquis_________on ___8/12_______as instructed by Dr. Varanasi_____________.  Stop taking ____________as directed by your Surgeon/Cardiologist.  Contact your Surgeon/Cardiologist for instructions on Anticoagulant Therapy prior to surgery.         How to Manage Your Diabetes Before and After Surgery  Why is it important to control my blood sugar before and after surgery? Improving blood sugar levels before and after surgery helps healing and can limit problems. A way of improving blood sugar control is eating a healthy diet by:  Eating less sugar and carbohydrates  Increasing activity/exercise  Talking with your doctor about reaching your blood sugar goals High blood sugars (greater than 180 mg/dL) can raise your risk of infections and slow your recovery, so you will need to focus on controlling your diabetes during the weeks before  surgery. Make sure that the doctor who takes care of your diabetes knows about your planned surgery including the date and location.  How do I manage my blood sugar before surgery? Check your blood sugar at least 4 times a day, starting 2 days before surgery, to make sure that the level is not too high or low. Check your blood sugar the morning of your surgery when you wake up and every 2 hours until you get to the Short Stay unit. If your blood sugar is less than 70 mg/dL, you will need to treat for low blood sugar: Do not take insulin. Treat a low blood sugar (less than 70 mg/dL) with  cup of clear juice (cranberry or apple), 4 glucose tablets, OR glucose gel. Recheck blood sugar in 15 minutes after treatment (to make sure it is greater than 70 mg/dL). If your blood sugar is not greater than 70 mg/dL on recheck, call [phone] for further instructions. Report your blood sugar to the short stay nurse when you get to Short Stay.  If you are admitted to the hospital after surgery: Your blood sugar will be checked by the staff and you will probably be given insulin after surgery (instead of oral diabetes medicines) to make sure you have good blood sugar levels. The goal for blood sugar control after surgery is 80-180 mg/dL.   WHAT DO I DO ABOUT MY DIABETES MEDICATION?  Do not take oral diabetes medicines (pills) the morning of surgery.  THE NIGHT BEFORE SURGERY, take 0    units of insulin.       THE MORNING OF SURGERY, take  35 units of  glargine    insulin.  The day of surgery, do not take other diabetes injectables, including Byetta (exenatide), Bydureon (exenatide ER), Victoza (liraglutide), or Trulicity (dulaglutide).                              You may not have any metal on your body including hair pins and              piercings  Do not wear jewelry, make-up, lotions, powders or perfumes, deodorant                         Men may shave face and neck.   Do not bring  valuables to the hospital. [place].  Contacts, dentures or bridgework may not be worn into surgery.                   [place] - Preparing for Surgery Before surgery, you can play an important role.  Because skin is not sterile, your skin needs to be as free of germs as possible.  You can reduce the number of germs on your skin by washing with CHG (chlorahexidine gluconate) soap before surgery.  CHG is an antiseptic cleaner which kills germs and bonds with the skin to continue killing germs even after washing. Please DO NOT use if you have an allergy to CHG or antibacterial soaps.  If your skin becomes reddened/irritated stop using the CHG and inform your nurse when you arrive at Short Stay.   You may shave your face/neck.  Please follow these instructions carefully:  1.  Shower with CHG Soap the night before surgery and the  morning of Surgery.  2.  If you choose to wash your hair, wash your hair first as usual with your  normal  shampoo.  3.  After you shampoo, rinse your hair and body thoroughly to remove the  shampoo.                                       4.  Use CHG as you would any other liquid soap.  You can apply chg directly  to the skin and wash                       Gently with a scrungie or clean washcloth.  5.  Apply the CHG Soap to your body ONLY FROM THE NECK DOWN.   Do not use on face/ open                           Wound or open sores. Avoid contact with eyes, ears mouth and genitals (private parts).                       Wash face,  Genitals (private parts) with your normal soap.             6.  Wash thoroughly,  paying special attention to the area where your surgery  will be performed.  7.  Thoroughly rinse your body with warm water from the neck down.  8.  DO NOT shower/wash with your normal soap after using and rinsing off  the CHG Soap.            9.  Pat yourself dry with a clean towel.            10.  Wear clean  pajamas.            11.  Place clean sheets on your bed the night of your first shower and do not  sleep with pets. Day of Surgery : Do not apply any lotions/deodorants the morning of surgery.  Please wear clean clothes to the hospital/surgery center.  FAILURE TO FOLLOW THESE INSTRUCTIONS MAY RESULT IN THE CANCELLATION OF YOUR SURGERY PATIENT SIGNATURE_________________________________  NURSE SIGNATURE__________________________________  ________________________________________________________________________   Adam Phenix  An incentive spirometer is a tool that can help keep your lungs clear and active. This tool measures how well you are filling your lungs with each breath. Taking long deep breaths may help reverse or decrease the chance of developing breathing (pulmonary) problems (especially infection) following: A long period of time when you are unable to move or be active. BEFORE THE PROCEDURE  If the spirometer includes an indicator to show your best effort, your nurse or respiratory therapist will set it to a desired goal. If possible, sit up straight or lean slightly forward. Try not to slouch. Hold the incentive spirometer in an upright position. INSTRUCTIONS FOR USE  Sit on the edge of your bed if possible, or sit up as far as you can in bed or on a chair. Hold the incentive spirometer in an upright position. Breathe out normally. Place the mouthpiece in your mouth and seal your lips tightly around it. Breathe in slowly and as deeply as possible, raising the piston or the ball toward the top of the column. Hold your breath for 3-5 seconds or for as long as possible. Allow the piston or ball to fall to the bottom of the column. Remove the mouthpiece from your mouth and breathe out normally. Rest for a few seconds and repeat Steps 1 through 7 at least 10 times every 1-2 hours when you are awake. Take your time and take a few normal breaths between deep breaths. The  spirometer may include an indicator to show your best effort. Use the indicator as a goal to work toward during each repetition. After each set of 10 deep breaths, practice coughing to be sure your lungs are clear. If you have an incision (the cut made at the time of surgery), support your incision when coughing by placing a pillow or rolled up towels firmly against it. Once you are able to get out of bed, walk around indoors and cough well. You may stop using the incentive spirometer when instructed by your caregiver.  RISKS AND COMPLICATIONS Take your time so you do not get dizzy or light-headed. If you are in pain, you may need to take or ask for pain medication before doing incentive spirometry. It is harder to take a deep breath if you are having pain. AFTER USE Rest and breathe slowly and easily. It can be helpful to keep track of a log of your progress. Your caregiver can provide you with a simple table to help with this. If you are using the spirometer at home, follow these instructions:  SEEK MEDICAL CARE IF:  You are having difficultly using the spirometer. You have trouble using the spirometer as often as instructed. Your pain medication is not giving enough relief while using the spirometer. You develop fever of 100.5 F (38.1 C) or higher. SEEK IMMEDIATE MEDICAL CARE IF:  You cough up bloody sputum that had not been present before. You develop fever of 102 F (38.9 C) or greater. You develop worsening pain at or near the incision site. MAKE SURE YOU:  Understand these instructions. Will watch your condition. Will get help right away if you are not doing well or get worse. Document Released: 04/04/2007 Document Revised: 02/14/2012 Document Reviewed: 06/05/2007 [facility] Patient Information 2014 [place], [place].   ________________________________________________________________________

## 2021-07-08 ENCOUNTER — Encounter (HOSPITAL_COMMUNITY): Payer: Self-pay

## 2021-07-08 ENCOUNTER — Other Ambulatory Visit: Payer: Self-pay

## 2021-07-08 ENCOUNTER — Encounter (HOSPITAL_COMMUNITY)
Admission: RE | Admit: 2021-07-08 | Discharge: 2021-07-08 | Disposition: A | Discharge status: Home / Self Care | Payer: Medicare Other | Source: Ambulatory Visit | Attending: Orthopaedic Surgery | Admitting: Orthopaedic Surgery

## 2021-07-08 DIAGNOSIS — I129 Hypertensive chronic kidney disease with stage 1 through stage 4 chronic kidney disease, or unspecified chronic kidney disease: Secondary | ICD-10-CM | POA: Diagnosis not present

## 2021-07-08 DIAGNOSIS — Z01818 Encounter for other preprocedural examination: Secondary | ICD-10-CM | POA: Diagnosis present

## 2021-07-08 DIAGNOSIS — I2510 Atherosclerotic heart disease of native coronary artery without angina pectoris: Secondary | ICD-10-CM | POA: Diagnosis not present

## 2021-07-08 DIAGNOSIS — N184 Chronic kidney disease, stage 4 (severe): Secondary | ICD-10-CM | POA: Insufficient documentation

## 2021-07-08 DIAGNOSIS — Z951 Presence of aortocoronary bypass graft: Secondary | ICD-10-CM | POA: Insufficient documentation

## 2021-07-08 DIAGNOSIS — Z955 Presence of coronary angioplasty implant and graft: Secondary | ICD-10-CM | POA: Diagnosis not present

## 2021-07-08 DIAGNOSIS — M1711 Unilateral primary osteoarthritis, right knee: Secondary | ICD-10-CM | POA: Insufficient documentation

## 2021-07-08 DIAGNOSIS — E1122 Type 2 diabetes mellitus with diabetic chronic kidney disease: Secondary | ICD-10-CM | POA: Insufficient documentation

## 2021-07-08 HISTORY — DX: Personal history of urinary calculi: Z87.442

## 2021-07-08 LAB — CBC WITH DIFFERENTIAL/PLATELET
Abs Immature Granulocytes: 0.03 10*3/uL (ref 0.00–0.07)
Basophils Absolute: 0 10*3/uL (ref 0.0–0.1)
Basophils Relative: 0 %
Eosinophils Absolute: 0.4 10*3/uL (ref 0.0–0.5)
Eosinophils Relative: 5 %
HCT: 40.7 % (ref 39.0–52.0)
Hemoglobin: 13.7 g/dL (ref 13.0–17.0)
Immature Granulocytes: 0 %
Lymphocytes Relative: 22 %
Lymphs Abs: 1.7 10*3/uL (ref 0.7–4.0)
MCH: 30.5 pg (ref 26.0–34.0)
MCHC: 33.7 g/dL (ref 30.0–36.0)
MCV: 90.6 fL (ref 80.0–100.0)
Monocytes Absolute: 0.6 10*3/uL (ref 0.1–1.0)
Monocytes Relative: 7 %
Neutro Abs: 5.1 10*3/uL (ref 1.7–7.7)
Neutrophils Relative %: 66 %
Platelets: 214 10*3/uL (ref 150–400)
RBC: 4.49 MIL/uL (ref 4.22–5.81)
RDW: 13.8 % (ref 11.5–15.5)
WBC: 7.8 10*3/uL (ref 4.0–10.5)
nRBC: 0 % (ref 0.0–0.2)

## 2021-07-08 LAB — SEDIMENTATION RATE: Sed Rate: 2 mm/hr (ref 0–30)

## 2021-07-08 LAB — PROTIME-INR
INR: 1.2 (ref 0.8–1.2)
Prothrombin Time: 15.5 seconds — ABNORMAL HIGH (ref 11.4–15.2)

## 2021-07-08 LAB — APTT: aPTT: 35 seconds (ref 24–36)

## 2021-07-08 LAB — URINALYSIS, ROUTINE W REFLEX MICROSCOPIC
Bilirubin Urine: NEGATIVE
Glucose, UA: NEGATIVE mg/dL
Hgb urine dipstick: NEGATIVE
Ketones, ur: NEGATIVE mg/dL
Leukocytes,Ua: NEGATIVE
Nitrite: NEGATIVE
Protein, ur: NEGATIVE mg/dL
Specific Gravity, Urine: 1.018 (ref 1.005–1.030)
pH: 5 (ref 5.0–8.0)

## 2021-07-08 LAB — RPR: RPR Ser Ql: NONREACTIVE

## 2021-07-08 LAB — VITAMIN B12: Vitamin B-12: 646 pg/mL (ref 232–1245)

## 2021-07-08 LAB — BASIC METABOLIC PANEL
Anion gap: 10 (ref 5–15)
BUN: 28 mg/dL — ABNORMAL HIGH (ref 8–23)
CO2: 27 mmol/L (ref 22–32)
Calcium: 9.6 mg/dL (ref 8.9–10.3)
Chloride: 105 mmol/L (ref 98–111)
Creatinine, Ser: 1.99 mg/dL — ABNORMAL HIGH (ref 0.61–1.24)
GFR, Estimated: 35 mL/min — ABNORMAL LOW (ref >60)
Glucose, Bld: 64 mg/dL — ABNORMAL LOW (ref 70–99)
Potassium: 4 mmol/L (ref 3.5–5.1)
Sodium: 142 mmol/L (ref 135–145)

## 2021-07-08 LAB — HEMOGLOBIN A1C
Hgb A1c MFr Bld: 7.8 % — ABNORMAL HIGH (ref 4.8–5.6)
Mean Plasma Glucose: 177.16 mg/dL

## 2021-07-08 LAB — GLUCOSE, CAPILLARY: Glucose-Capillary: 78 mg/dL (ref 70–99)

## 2021-07-08 LAB — SURGICAL PCR SCREEN
MRSA, PCR: NEGATIVE
Staphylococcus aureus: POSITIVE — AB

## 2021-07-08 LAB — TYPE AND SCREEN
ABO/RH(D): O POS
Antibody Screen: NEGATIVE

## 2021-07-08 NOTE — Progress Notes (Signed)
COVID Vaccine Completed:Yes Date COVID Vaccine completed:02/19/20 Booster 09/12/20, 05/08/21 COVID vaccine manufacturer: Pfizer     PCP - Dr. Frederik Pear [place] 06/29/21 Cardiologist - Dr. Lendell Caprice LOV 03/25/21  Chest x-ray - 03/07/21-epic EKG - 06/30/21-epic Stress Test - no ECHO - 01/08/20-epic Cardiac Cath - 05/30/15,12/15/17, 01/07/20-epic CABG x5 2000 Pacemaker/ICD device last checked:NA  Sleep Study - yes CPAP - yes  Fasting Blood Sugar - 78-105 Checks Blood Sugar _____ times a day Libre continuous testing on RT arm  Blood Thinner Instructions:eliquis- [name] Aspirin Instructions:Hold for 3 days prior to DOS/ [name] Last Dose:07/17/21  Anesthesia review: Yes  Patient denies shortness of breath, fever, cough and chest pain at PAT appointment Pt reports no SOB doing housework or with ADLs.  His last A1c in April was 8.9 but he feels he has been doing better.he has not had a seizure in a long time but ran out of Depakote. He will try to resume them ASAP  Patient verbalized understanding of instructions that were given to them at the PAT appointment. Patient was also instructed that they will need to review over the PAT instructions again at home before surgery. yes

## 2021-07-09 ENCOUNTER — Encounter (HOSPITAL_COMMUNITY): Payer: Self-pay | Admitting: Physician Assistant

## 2021-07-09 ENCOUNTER — Other Ambulatory Visit (HOSPITAL_BASED_OUTPATIENT_CLINIC_OR_DEPARTMENT_OTHER): Payer: Self-pay

## 2021-07-09 ENCOUNTER — Other Ambulatory Visit: Payer: Self-pay | Admitting: Orthopaedic Surgery

## 2021-07-09 NOTE — Anesthesia Preprocedure Evaluation (Deleted)
Anesthesia Evaluation    Airway        Dental   Pulmonary           Cardiovascular hypertension,      Neuro/Psych    GI/Hepatic   Endo/Other  diabetes  Renal/GU      Musculoskeletal   Abdominal   Peds  Hematology   Anesthesia Other Findings   Reproductive/Obstetrics                             Anesthesia Physical Anesthesia Plan  ASA:   Anesthesia Plan:    Post-op Pain Management:    Induction:   PONV Risk Score and Plan:   Airway Management Planned:   Additional Equipment:   Intra-op Plan:   Post-operative Plan:   Informed Consent:   Plan Discussed with:   Anesthesia Plan Comments: (See PAT note 07/08/2021, Konrad Felix, PA-C)        Anesthesia Quick Evaluation

## 2021-07-09 NOTE — Progress Notes (Signed)
Anesthesia Chart Review   Case: [id number] Date/Time: 07/21/21 0715   Procedure: RIGHT TOTAL KNEE ARTHROPLASTY (Right: Knee)   Anesthesia type: Spinal   Pre-op diagnosis: RIGHT KNEE DEGENERATIVE JOINT DISEASE   Location: [facility] ROOM 06 / [facility]   Surgeons: [name], MD       DISCUSSION:75 y.o. never smoker with h/o GERD, OSA, HTN, CAD (CABG 2000, stent 2009), DM II, CKD Stage IV, right knee djd scheduled for above procedure 07/21/2021 with Dr. [name].   Per cardiology preoperative evaluation 06/26/2021, "[name] [name] was last seen on 4/41/22 by [name] for cardiac clearance.  He reported some DOE, attributed to deconditioning 2/2 needing a knee replacement. No chest pain. No further testing was felt needed prior to proceeding with surgery. Since that day, [name] [name] has done well.  He cannot achieve 4 METS with consideration of his knee issues.   His RCRI score is calculated at >11% risk of MACE when considering his CAD, renal function, and that he is on insulin for DM2. Based on ACC/AHA guidelines, he may proceed with the planned procedure without further cardiovascular testing. Last cath 01/07/20 with stable images compared with 2019 and right heart pressures nl without explanation for his DOE. Echo EF 60-65% with mildly incrased LVH of the basal septum.   He should hold his Eliquis for 3 days prior to the procedure."  Last seen by PCP 07/05/2021, per note pt cleared medically.   VS: BP 120/65   Pulse 65   Temp 36.8 C (Oral)   Resp 20   Ht 5\' 9"  (1.753 m)   Wt 92.5 kg   SpO2 100%   BMI 30.13 kg/m   PROVIDERS: [name], MD is PCP   Larae Grooms, MD is Cardiologist  LABS: Labs reviewed: Acceptable for surgery. (all labs ordered are listed, but only abnormal results are displayed)  Labs Reviewed  SURGICAL PCR SCREEN - Abnormal; Notable for the following components:      Result Value   Staphylococcus aureus POSITIVE (*)    All other  components within normal limits  BASIC METABOLIC PANEL - Abnormal; Notable for the following components:   Glucose, Bld 64 (*)    BUN 28 (*)    Creatinine, Ser 1.99 (*)    GFR, Estimated 35 (*)    All other components within normal limits  PROTIME-INR - Abnormal; Notable for the following components:   Prothrombin Time 15.5 (*)    All other components within normal limits  HEMOGLOBIN A1C - Abnormal; Notable for the following components:   Hgb A1c MFr Bld 7.8 (*)    All other components within normal limits  CBC WITH DIFFERENTIAL/PLATELET  APTT  URINALYSIS, ROUTINE W REFLEX MICROSCOPIC  GLUCOSE, CAPILLARY  TYPE AND SCREEN     IMAGES:   EKG: 05/09/2021 Rate 56 bpm  Sinus rhythm Prolonged PR interval  RBBB No change since last tracing  CV: Cardiac Cath 01/07/2020 Right heart cath demonstrated normal filling pressures and pulmonary artery pressure. Total occlusion of the mid LAD and diagonal.  Ramus intermedius is also totally occluded.  Mild to moderate disease is noted in the distal circumflex.  The native right coronary is totally occluded beyond the origin of the PDA. The bypass graft to the continuation of the right coronary is widely patent. The bypass graft to the diagonal is widely patent. The bypass graft to the ramus intermedius is widely patent. LIMA to the LAD is widely patent. Left ventricular function  was not assessed with this study.  Left ventricular end-diastolic pressure was normal.   RECOMMENDATIONS:   In review of images, no significant change has occurred compared to 2019. If angina, consider microvascular origin.  Uncertain explanation for the patient's dyspnea.  Echo 01/08/2020  1. Left ventricular ejection fraction, by visual estimation, is 60 to  65%. The left ventricle has normal function. Mildly increased left  ventricular posterior wall thickness. There is mildly increased left  ventricular hypertrophy of the basal septum.   2. The left ventricle  has no regional wall motion abnormalities.   3. Left ventricular diastolic parameters are indeterminate.   4. Elevated left ventricular end-diastolic pressure.   5. Global right ventricle has normal systolic function.The right  ventricular size is normal. No increase in right ventricular wall  thickness.   6. Left atrial size was normal.   7. Right atrial size was normal.   8. The mitral valve is normal in structure. Mild mitral valve  regurgitation. No evidence of mitral stenosis.   9. The tricuspid valve is normal in structure. Tricuspid valve  regurgitation is not demonstrated.  10. The aortic valve is normal in structure. Aortic valve regurgitation is  not visualized. No evidence of aortic valve sclerosis or stenosis.  11. The pulmonic valve was normal in structure. Pulmonic valve  regurgitation is trivial.  12. The inferior vena cava is normal in size with <50% respiratory  variability, suggesting right atrial pressure of 8 mmHg.  Past Medical History:  Diagnosis Date   Acquired atrophy of thyroid 06/05/2013   Overview:  July 2014: controlled on synthroid 37mcg since 2012 May 2015: decreased to Synthroid 50 mcg  Last Assessment & Plan:  Pt doing well with most recent TSH within normal range.  Will continue current dosage of Synthroid 71mcg. Pt reminded to take medication on empty stomach. Will continue to monitor with periodic laboratory assessment in 3 months.    Anemia    hemoglobin 7.4, iron deficiency, January, 2011, 2 unit transfusion, endoscopy normal, capsule endoscopy February, 2011 no small bowel abnormalities.   Most likely source gastric erosions, followed by GI   Anxiety    Bradycardia    CAD (coronary artery disease)    A. CABG in 2000,status post cardiac cath in 2006, 2009 ....continued chest pain and SOB despite oral medication adjestments including Ranexa. B. Cath November 2009/ mRCA - 2.75 x 23 Abbott Xience V drug-eluting stent ...11/26/2008 to distal  RCA leading to  acute marginal.  C. Cath 07/2012 for CP - stable anatomy, med rx. d. cath 2015 and 05/30/2015 stable anatomy, consider Myoview if has CP again   Carotid artery disease ([place]) 08/01/2015   Doppler, May 29, 2015, 1-39% bilateral ICA    Cerebral ischemia    MRI November, 2010, chronic microvascular ischemia   CKD (chronic kidney disease), stage III ([place])    Concussion    Depression    Bipolar   Diabetes mellitus ([place])    Edema    Essential hypertension 06/05/2013   Overview:  July 2014: Controlled with Bystolic Sep 7893: Changed to atenolol.  Last Assessment & Plan:  Will change to atenolol for cost and follow Improved.  Medication compliance strongly encouraged BP: 116/64 mmHg    Overview:  July 2014: Controlled with Bystolic Sep 8101: Changed to atenolol.  Last Assessment & Plan:  Will change to atenolol for cost and follow   Falling episodes    these have occurred in the past and again recurring 2011  Family history of adverse reaction to anesthesia    "mother died during bypass surgery but not sure if it has to do with anesthesia"   Gastric ulcer    GERD (gastroesophageal reflux disease)    H/O medication noncompliance    Due to loss of insurance   H/O multiple concussions    Hard of hearing    History of blood transfusion 12/20/2013   History of kidney stones    Hx of CABG    2000,  / one median sternotomy suture broken her chest x-ray November, 2010, no clinical significance   Hyperlipidemia    Hypertension    pt. denies   Iron deficiency anemia    Long term (current) use of anticoagulants [Z79.01] 07/20/2016   Low back pain 06/12/2009   Qualifier: Diagnosis of  By: [name]    Nephrolithiasis    Orthostasis    OSA (obstructive sleep apnea)    PAF (paroxysmal atrial fibrillation) ([place])    a. dx 2017, started on amiodarone/warfarin but patient intermittently noncompliant.   PSVT (paroxysmal supraventricular tachycardia) ([place])    RBBB 07/09/2009   Qualifier:  Diagnosis of  By: [name], MD, [facility], [name]    RLS (restless legs syndrome) 09/19/2009   Qualifier: Diagnosis of  By: [name]   Overview:  July 2014: Controlled with Mirapex  Last Assessment & Plan:  Patient is doing well. Will continue current management and follow clinically.   Seizure disorder ([place]) 01/09/2017   Spondylosis    C5-6, C6-7 MRI 2010   Syncope 03/2016   TBI (traumatic brain injury) ([place]) 2019   Thyroid disease    Tubulovillous adenoma of colon 2007   Vitamin D deficiency 05/17/2017   Wears glasses     Past Surgical History:  Procedure Laterality Date   ANTERIOR CERVICAL DECOMP/DISCECTOMY FUSION N/A 05/31/2016   Procedure: ANTERIOR CERVICAL DECOMPRESSION/DISCECTOMY FUSION CERVICAL FIVE-SIX,CERVICAL SIX-SEVEN;  Surgeon: [name], MD;  Location: [place] NEURO ORS;  Service: Neurosurgery;  Laterality: N/A;   CARDIAC CATHETERIZATION N/A 05/30/2015   Procedure: Left Heart Cath and Coronary Angiography;  Surgeon: [name], MD;  Location: [place] CV LAB;  Service: Cardiovascular;  Laterality: N/A;   COLONOSCOPY     CORONARY ARTERY BYPASS GRAFT     2000   ESOPHAGOGASTRODUODENOSCOPY     LEFT HEART CATH AND CORS/GRAFTS ANGIOGRAPHY N/A 12/15/2017   Procedure: LEFT HEART CATH AND CORS/GRAFTS ANGIOGRAPHY;  Surgeon: [name], MD;  Location: [place] CV LAB;  Service: Cardiovascular;  Laterality: N/A;   LEFT HEART CATHETERIZATION WITH CORONARY/GRAFT ANGIOGRAM N/A 08/01/2012   Procedure: LEFT HEART CATHETERIZATION WITH Beatrix Fetters;  Surgeon: [name], MD;  Location[facility] C[facility] LAB;  Service: Cardiovascular;  Laterality: N/A;   LEFT HEART CATHETERIZATION WITH CORONARY/GRAFT ANGIOGRAM N/A 01/03/2015   Procedure: LEFT HEART CATHETERIZATION WITH Beatrix Fetters;  Surgeon: [name], MD;  Location: [facility] CATH LAB;  Service: Cardiovascular;  Laterality: N/A;   NASAL SEPTUM SURGERY     UP3   PERCUTANEOUS CORONARY STENT  INTERVENTION (PCI-S)  10/2008   mRCA PCI  2.75 x 23 Abbott Xience V drug-eluting stent    RIGHT/LEFT HEART CATH AND CORONARY/GRAFT ANGIOGRAPHY N/A 01/07/2020   Procedure: RIGHT/LEFT HEART CATH AND CORONARY/GRAFT ANGIOGRAPHY;  Surgeon: [name], MD;  Location: [place] CV LAB;  Service: Cardiovascular;  Laterality: N/A;   ULTRASOUND GUIDANCE FOR VASCULAR ACCESS  12/15/2017   Procedure: Ultrasound Guidance For Vascular Access;  Surgeon: Irish Lack,  Charlann Lange, MD;  Location: [place] CV LAB;  Service: Cardiovascular;;    MEDICATIONS:  albuterol (VENTOLIN HFA) 108 (90 Base) MCG/ACT inhaler   amLODipine (NORVASC) 5 MG tablet   apixaban (ELIQUIS) 5 MG TABS tablet   aspirin EC 81 MG tablet   atorvastatin (LIPITOR) 80 MG tablet   busPIRone (BUSPAR) 7.5 MG tablet   cetirizine (ZYRTEC) 10 MG tablet   Continuous Blood Gluc Sensor (FREESTYLE LIBRE 14 DAY SENSOR) MISC   Diclofenac Sodium (PENNSAID) 2 % SOLN   divalproex (DEPAKOTE) 500 MG DR tablet   famotidine (PEPCID) 20 MG tablet   fenofibrate (TRICOR) 145 MG tablet   furosemide (LASIX) 20 MG tablet   glucose blood (ACCU-CHEK AVIVA) test strip   HYDROcodone-acetaminophen (NORCO/VICODIN) 5-325 MG tablet   hydrocortisone 2.5 % cream   hydrOXYzine (ATARAX/VISTARIL) 10 MG tablet   insulin glargine, 2 Unit Dial, (TOUJEO MAX SOLOSTAR) 300 UNIT/ML Solostar Pen   Insulin Pen Needle 31G X 8 MM MISC   Insulin Syringe-Needle U-100 31G X 5/16" 1 ML MISC   isosorbide mononitrate (IMDUR) 120 MG 24 hr tablet   levothyroxine (SYNTHROID) 100 MCG tablet   metoprolol succinate (TOPROL-XL) 50 MG 24 hr tablet   nitroGLYCERIN (NITROSTAT) 0.4 MG SL tablet   pantoprazole (PROTONIX) 40 MG tablet   pramipexole (MIRAPEX) 1 MG tablet   ranolazine (RANEXA) 500 MG 12 hr tablet   sertraline (ZOLOFT) 100 MG tablet   tiZANidine (ZANAFLEX) 4 MG tablet   triamcinolone cream (KENALOG) 0.1 %   Vitamin D, Ergocalciferol, (DRISDOL) 1.25 MG (50000 UNIT) CAPS capsule    No current facility-administered medications for this encounter.     Konrad Felix, PA-C [facility] Pre-Surgical Testing [phone]

## 2021-07-09 NOTE — Telephone Encounter (Signed)
Patient is following up regarding his clearance. He is requesting a call back to go over the recommendation.

## 2021-07-10 ENCOUNTER — Other Ambulatory Visit (HOSPITAL_BASED_OUTPATIENT_CLINIC_OR_DEPARTMENT_OTHER): Payer: Self-pay

## 2021-07-10 MED FILL — Continuous Glucose System Sensor: 28 days supply | Qty: 2 | Fill #2 | Status: CN

## 2021-07-13 ENCOUNTER — Other Ambulatory Visit (HOSPITAL_BASED_OUTPATIENT_CLINIC_OR_DEPARTMENT_OTHER): Payer: Self-pay

## 2021-07-13 ENCOUNTER — Other Ambulatory Visit: Payer: Self-pay | Admitting: Family Medicine

## 2021-07-13 MED ORDER — BUSPIRONE HCL 7.5 MG PO TABS
7.5000 mg | ORAL_TABLET | Freq: Two times a day (BID) | ORAL | 1 refills | Status: DC
  Filled 2021-07-13: qty 60, 30d supply, fill #0

## 2021-07-13 NOTE — Care Plan (Signed)
Ortho Bundle Case Management Note  Patient Details  Name: [name] [name] MRN: [id number] Date of Birth: 1946-11-06  Spoke with patient prior to surgery. Will discharge to home with family to assist. Has equipment at home. HHPT referral to [facility] and OPPT referral to [facility]. Patient and MD in agreement with plan. Choice offered                   DME Arranged:    DME Agency:     HH Arranged:  PT HH Agency:  [place]  Additional Comments: Please contact me with any questions of if this plan should need to change.  Ladell Heads,  [facility]  [phone] 07/13/2021, 11:23 AM

## 2021-07-15 ENCOUNTER — Other Ambulatory Visit (HOSPITAL_BASED_OUTPATIENT_CLINIC_OR_DEPARTMENT_OTHER): Payer: Self-pay

## 2021-07-15 MED FILL — Continuous Glucose System Sensor: 28 days supply | Qty: 2 | Fill #2 | Status: CN

## 2021-07-17 ENCOUNTER — Other Ambulatory Visit: Payer: Self-pay | Admitting: Orthopaedic Surgery

## 2021-07-17 LAB — SARS CORONAVIRUS 2 (TAT 6-24 HRS): SARS Coronavirus 2: POSITIVE — AB

## 2021-07-17 NOTE — H&P (Signed)
TOTAL KNEE ADMISSION H&P  Patient is being admitted for right total knee arthroplasty.  Subjective:  Chief Complaint:right knee pain.  HPI: [name] [name], 75 y.o. male, has a history of pain and functional disability in the right knee due to arthritis and has failed non-surgical conservative treatments for greater than 12 weeks to includeNSAID's and/or analgesics, corticosteriod injections, viscosupplementation injections, flexibility and strengthening excercises, supervised PT with diminished ADL's post treatment, use of assistive devices, weight reduction as appropriate, and activity modification.  Onset of symptoms was gradual, starting 5 years ago with gradually worsening course since that time. The patient noted no past surgery on the right knee(s).  Patient currently rates pain in the right knee(s) at 10 out of 10 with activity. Patient has night pain, worsening of pain with activity and weight bearing, pain that interferes with activities of daily living, and crepitus.  Patient has evidence of subchondral cysts, subchondral sclerosis, periarticular osteophytes, and joint space narrowing by imaging studies. There is no active infection.  Patient Active Problem List   Diagnosis Date Noted   Sprain of anterior talofibular ligament of left ankle 01/08/2021   Achilles tendon pain 06/06/2020   Thoracic back pain 05/11/2020   CKD (chronic kidney disease), stage IV ([place])    Left ankle pain 12/30/2019   Frequency of urination 05/13/2019   Obesity 02/05/2019   Neck pain 02/05/2019   MCI (mild cognitive impairment) 02/05/2019   Fall at home, initial encounter 11/06/2018   Localized swelling of both lower extremities 07/13/2018   Cervical radiculopathy at C8 02/08/2018   Hypersomnolence 10/18/2017   Vitamin D deficiency 05/17/2017   Seizure disorder ([place]) 01/09/2017   Atrial fibrillation ([place]) [I48.91] 07/20/2016   Long term (current) use of anticoagulants [Z79.01] 07/20/2016   Foraminal  stenosis of cervical region 05/31/2016   DOE (dyspnea on exertion) 05/14/2016   Chronic diastolic CHF (congestive heart failure) ([place]) 03/13/2016   Headache 01/25/2016   Carotid artery disease ([place]) 08/01/2015   Spinal stenosis in cervical region 07/30/2015   Occipital neuralgia of right side 07/15/2015   Cervicogenic headache 07/15/2015   Dizziness    Chest pain 05/29/2015   Medicare annual wellness visit, initial 05/11/2015   Sun-damaged skin 05/11/2015   Advance care planning 05/01/2015   Post-traumatic headache 02/18/2015   OA (osteoarthritis) of knee 01/07/2015   Recurrent pain of right knee 05/10/2014   Personal history of ongoing treatment with alendronate (Fosamax) 12/20/2013   History of blood transfusion 12/20/2013   Hyperlipidemia LDL goal <70 06/27/2013   HLD (hyperlipidemia) 06/27/2013   Essential hypertension 06/05/2013   Arthritis of hand, degenerative 06/05/2013   Acquired atrophy of thyroid 06/05/2013   Clinical depression 06/05/2013   Hypothyroidism due to acquired atrophy of thyroid 06/05/2013   Osteoarthritis of hand 06/05/2013   Arteriosclerosis of coronary artery 05/01/2013   Benign prostatic hyperplasia with urinary obstruction 05/01/2013   BPH with obstruction/lower urinary tract symptoms 05/01/2013   CAD (coronary artery disease) 05/01/2013   Abdominal pain, unspecified site 03/06/2013   Right shoulder pain 10/24/2012   Dermatitis 10/24/2012   Ejection fraction    Diabetes mellitus type II, uncontrolled ([place]) 08/31/2011   Coronary artery disease involving native coronary artery    Anemia    OSA (obstructive sleep apnea)    Edema    Falling episodes    Hx of CABG    Palpitations    Shortness of breath    Insomnia 04/26/2011   RLS (restless legs syndrome) 09/19/2009   Depression  with anxiety 07/09/2009   RBBB 07/09/2009   Low back pain potentially associated with radiculopathy 06/12/2009   SCIATICA 01/17/2009   GERD (gastroesophageal reflux  disease) 01/09/2009   TUBULOVILLOUS ADENOMA, COLON, HX OF 01/08/2009   Presence of drug coated stent in right coronary artery 10/29/2008   NEPHROLITHIASIS, HX OF 12/20/2007   Past Medical History:  Diagnosis Date   Acquired atrophy of thyroid 06/05/2013   Overview:  July 2014: controlled on synthroid 41mcg since 2012 May 2015: decreased to Synthroid 50 mcg  Last Assessment & Plan:  Pt doing well with most recent TSH within normal range.  Will continue current dosage of Synthroid 31mcg. Pt reminded to take medication on empty stomach. Will continue to monitor with periodic laboratory assessment in 3 months.    Anemia    hemoglobin 7.4, iron deficiency, January, 2011, 2 unit transfusion, endoscopy normal, capsule endoscopy February, 2011 no small bowel abnormalities.   Most likely source gastric erosions, followed by GI   Anxiety    Bradycardia    CAD (coronary artery disease)    A. CABG in 2000,status post cardiac cath in 2006, 2009 ....continued chest pain and SOB despite oral medication adjestments including Ranexa. B. Cath November 2009/ mRCA - 2.75 x 23 Abbott Xience V drug-eluting stent ...11/26/2008 to distal  RCA leading to acute marginal.  C. Cath 07/2012 for CP - stable anatomy, med rx. d. cath 2015 and 05/30/2015 stable anatomy, consider Myoview if has CP again   Carotid artery disease ([place]) 08/01/2015   Doppler, May 29, 2015, 1-39% bilateral ICA    Cerebral ischemia    MRI November, 2010, chronic microvascular ischemia   CKD (chronic kidney disease), stage III ([place])    Concussion    Depression    Bipolar   Diabetes mellitus ([place])    Edema    Essential hypertension 06/05/2013   Overview:  July 2014: Controlled with Bystolic Sep 1941: Changed to atenolol.  Last Assessment & Plan:  Will change to atenolol for cost and follow Improved.  Medication compliance strongly encouraged BP: 116/64 mmHg    Overview:  July 2014: Controlled with Bystolic Sep 7408: Changed to atenolol.  Last  Assessment & Plan:  Will change to atenolol for cost and follow   Falling episodes    these have occurred in the past and again recurring 2011   Family history of adverse reaction to anesthesia    "mother died during bypass surgery but not sure if it has to do with anesthesia"   Gastric ulcer    GERD (gastroesophageal reflux disease)    H/O medication noncompliance    Due to loss of insurance   H/O multiple concussions    Hard of hearing    History of blood transfusion 12/20/2013   History of kidney stones    Hx of CABG    2000,  / one median sternotomy suture broken her chest x-ray November, 2010, no clinical significance   Hyperlipidemia    Hypertension    pt. denies   Iron deficiency anemia    Long term (current) use of anticoagulants [Z79.01] 07/20/2016   Low back pain 06/12/2009   Qualifier: Diagnosis of  By: [name]    Nephrolithiasis    Orthostasis    OSA (obstructive sleep apnea)    PAF (paroxysmal atrial fibrillation) ([place])    a. dx 2017, started on amiodarone/warfarin but patient intermittently noncompliant.   PSVT (paroxysmal supraventricular tachycardia) ([place])    RBBB 07/09/2009  Qualifier: Diagnosis of  By: [name], MD, Caffie Damme    RLS (restless legs syndrome) 09/19/2009   Qualifier: Diagnosis of  By: [name]   Overview:  July 2014: Controlled with Mirapex  Last Assessment & Plan:  Patient is doing well. Will continue current management and follow clinically.   Seizure disorder ([place]) 01/09/2017   Spondylosis    C5-6, C6-7 MRI 2010   Syncope 03/2016   TBI (traumatic brain injury) ([place]) 2019   Thyroid disease    Tubulovillous adenoma of colon 2007   Vitamin D deficiency 05/17/2017   Wears glasses     Past Surgical History:  Procedure Laterality Date   ANTERIOR CERVICAL DECOMP/DISCECTOMY FUSION N/A 05/31/2016   Procedure: ANTERIOR CERVICAL DECOMPRESSION/DISCECTOMY FUSION CERVICAL FIVE-SIX,CERVICAL SIX-SEVEN;  Surgeon: [name], MD;   Location: [facility];  Service: Neurosurgery;  Laterality: N/A;   CARDIAC CATHETERIZATION N/A 05/30/2015   Procedure: Left Heart Cath and Coronary Angiography;  Surgeon: [name], MD;  Location: [facility];  Service: Cardiovascular;  Laterality: N/A;   COLONOSCOPY     CORONARY ARTERY BYPASS GRAFT     2000   ESOPHAGOGASTRODUODENOSCOPY     LEFT HEART CATH AND CORS/GRAFTS ANGIOGRAPHY N/A 12/15/2017   Procedure: LEFT HEART CATH AND CORS/GRAFTS ANGIOGRAPHY;  Surgeon: [name], MD;  Location: [facility];  Service: Cardiovascular;  Laterality: N/A;   LEFT HEART CATHETERIZATION WITH CORONARY/GRAFT ANGIOGRAM N/A 08/01/2012   Procedure: LEFT HEART CATHETERIZATION WITH Beatrix Fetters;  Surgeon: [name], MD;  Location: [facility] LAB;  Service: Cardiovascular;  Laterality: N/A;   LEFT HEART CATHETERIZATION WITH CORONARY/GRAFT ANGIOGRAM N/A 01/03/2015   Procedure: LEFT HEART CATHETERIZATION WITH Beatrix Fetters;  Surgeon: [name], MD;  Location: [facility] CATH LAB;  Service: Cardiovascular;  Laterality: N/A;   NASAL SEPTUM SURGERY     UP3   PERCUTANEOUS CORONARY STENT INTERVENTION (PCI-S)  10/2008   mRCA PCI  2.75 x 23 Abbott Xience V drug-eluting stent    RIGHT/LEFT HEART CATH AND CORONARY/GRAFT ANGIOGRAPHY N/A 01/07/2020   Procedure: RIGHT/LEFT HEART CATH AND CORONARY/GRAFT ANGIOGRAPHY;  Surgeon: [name], MD;  Location: [facility];  Service: Cardiovascular;  Laterality: N/A;   ULTRASOUND GUIDANCE FOR VASCULAR ACCESS  12/15/2017   Procedure: Ultrasound Guidance For Vascular Access;  Surgeon: [name], MD;  Location: [facility];  Service: Cardiovascular;;    No current facility-administered medications for this encounter.   Current Outpatient Medications  Medication Sig Dispense Refill Last Dose   albuterol (VENTOLIN HFA) 108 (90 Base) MCG/ACT inhaler Inhale 2 puffs into the lungs every 6 (six) hours as needed  for wheezing or shortness of breath. 18 g 2    amLODipine (NORVASC) 5 MG tablet TAKE 1 TABLET BY MOUTH ONCE DAILY 90 tablet 2    apixaban (ELIQUIS) 5 MG TABS tablet Take 1 tablet (5 mg total) by mouth 2 (two) times daily. 60 tablet 5    aspirin EC 81 MG tablet Take 81 mg by mouth daily. Swallow whole.      atorvastatin (LIPITOR) 80 MG tablet Take 1 tablet (80 mg total) by mouth daily. (Patient taking differently: Take 80 mg by mouth at bedtime.) 90 tablet 3    cetirizine (ZYRTEC) 10 MG tablet TAKE 1 TABLET (10 MG TOTAL) BY MOUTH DAILY. 100 tablet 2    Diclofenac Sodium (PENNSAID) 2 % SOLN Place 1 application onto the skin 2 (two) times daily. (Patient  taking differently: Place 1 application onto the skin 2 (two) times daily as needed (pain).) 112 g 2    famotidine (PEPCID) 20 MG tablet TAKE 1 TABLET BY MOUTH 2 TIMES DAILY 60 tablet 2    fenofibrate (TRICOR) 145 MG tablet TAKE 1 TABLET (145 MG TOTAL) BY MOUTH DAILY. 90 tablet 1    furosemide (LASIX) 20 MG tablet TAKE 1 TABLET BY MOUTH DAILY AS NEEDED FOR EDEMA 30 tablet 3    hydrocortisone 2.5 % cream Apply on to the skin 2 (two) times daily. 30 g 1    hydrOXYzine (ATARAX/VISTARIL) 10 MG tablet Take 1 tablet (10 mg total) by mouth 3 (three) times daily as needed for anxiety or itching. 30 tablet 1    insulin glargine, 2 Unit Dial, (TOUJEO MAX SOLOSTAR) 300 UNIT/ML Solostar Pen Inject 130 Units into the skin every morning. 48 mL 3    isosorbide mononitrate (IMDUR) 120 MG 24 hr tablet TAKE 1 TABLET (120 MG TOTAL) BY MOUTH DAILY. 90 tablet 3    levothyroxine (SYNTHROID) 100 MCG tablet TAKE 1 TABLET (100 MCG TOTAL) BY MOUTH DAILY. 90 tablet 1    metoprolol succinate (TOPROL-XL) 50 MG 24 hr tablet TAKE 1 AND 1/2 TABLETS EVERY DAY FOR A TOTAL OF 75 MG 135 tablet 3    nitroGLYCERIN (NITROSTAT) 0.4 MG SL tablet PLACE 1 TABLET UNDER THE TONGUE EVERY 5 MINUTES AS NEEDED FOR CHEST PAIN. 25 tablet 1    pantoprazole (PROTONIX) 40 MG tablet TAKE 1 TABLET BY MOUTH  EVERY DAY 90 tablet 3    pramipexole (MIRAPEX) 1 MG tablet TAKE 1/2 TO 1 TABLET AT BEDTIME (Patient taking differently: Take 1 mg by mouth at bedtime.) 90 tablet 0    ranolazine (RANEXA) 500 MG 12 hr tablet TAKE 1 TABLET BY MOUTH TWICE DAILY 60 tablet 5    sertraline (ZOLOFT) 100 MG tablet TAKE 1 TABLET BY MOUTH TWICE DAILY 180 tablet 1    tiZANidine (ZANAFLEX) 4 MG tablet TAKE 1 TABLET BY MOUTH EVERY 6 HOURS AS NEEDED FOR MUSCLE SPASMS 40 tablet 2    Vitamin D, Ergocalciferol, (DRISDOL) 1.25 MG (50000 UNIT) CAPS capsule TAKE 1 CAPSULE BY MOUTH EVERY 7 DAYS 4 capsule 4    busPIRone (BUSPAR) 7.5 MG tablet Take 1 tablet (7.5 mg total) by mouth 2 (two) times daily. 60 tablet 1    Continuous Blood Gluc Sensor (FREESTYLE LIBRE 14 DAY SENSOR) MISC USE TO CHECK BLOOD SUGAR AND CHANGE EVERY 14 DAYS 6 each 3    divalproex (DEPAKOTE) 500 MG DR tablet Take 1 tablet (500 mg total) by mouth 2 (two) times daily. 60 tablet 5    glucose blood (ACCU-CHEK AVIVA) test strip Use as directed to check blood sugar 4 times daily.  DX E11.9 200 each 6    HYDROcodone-acetaminophen (NORCO/VICODIN) 5-325 MG tablet TAKE 1 TABLET BY MOUTH EVERY 8 (EIGHT) HOURS AS NEEDED. 15 tablet 0 Completed Course   Insulin Pen Needle 31G X 8 MM MISC use to inject insulin twice a day 100 each 3    Insulin Syringe-Needle U-100 31G X 5/16" 1 ML MISC USE AS DIRECTED WITH NOVOLIN 100 each 0    triamcinolone cream (KENALOG) 0.1 % Apply 1 application topically 2 (two) times daily. Use as needed for itchy rash 30 g 1 Completed Course   Allergies  Allergen Reactions   Morphine Other (See Comments)    Hallucinations   Shellfish-Derived Products Itching    Social History   Tobacco Use  Smoking status: Never   Smokeless tobacco: Never  Substance Use Topics   Alcohol use: No    Alcohol/week: 0.0 standard drinks    Comment: stopped drinking in 1998    Family History  Problem Relation Age of Onset   Pancreatic cancer Brother    Diabetes  Brother    Coronary artery disease Brother    Stroke Brother    Diabetes Brother    Diabetes Mother    Heart failure Mother    Heart failure Father    Hypothyroidism Brother    Coronary artery disease Brother    Other Brother        colon surgery   Heart attack Other        Nephew   Irregular heart beat Daughter    Cancer Maternal Grandmother        unknown    Colon cancer Neg Hx    Stomach cancer Neg Hx    Liver cancer Neg Hx    Rectal cancer Neg Hx    Esophageal cancer Neg Hx      Review of Systems  Musculoskeletal:  Positive for arthralgias.       Right knee  All other systems reviewed and are negative.  Objective:  Physical Exam Constitutional:      Appearance: Normal appearance.  HENT:     Head: Normocephalic and atraumatic.     Nose: Nose normal.     Mouth/Throat:     Pharynx: Oropharynx is clear.  Eyes:     Extraocular Movements: Extraocular movements intact.  Cardiovascular:     Rate and Rhythm: Normal rate.  Pulmonary:     Effort: Pulmonary effort is normal.  Abdominal:     Palpations: Abdomen is soft.  Musculoskeletal:     Cervical back: Normal range of motion.     Comments: Right knee motion is about 0-100.  He has medial joint line pain and crepitation.  There is no effusion and he has no scars.  Hip motion is full and straight leg raise is negative.  Opposite knee moves a bit better.  He walks with an altered gait.   Skin:    General: Skin is warm and dry.  Neurological:     General: No focal deficit present.     Mental Status: He is alert and oriented to person, place, and time.  Psychiatric:        Mood and Affect: Mood normal.        Behavior: Behavior normal.        Thought Content: Thought content normal.        Judgment: Judgment normal.    Vital signs in last 24 hours:    Labs:   Estimated body mass index is 30.13 kg/m as calculated from the following:   Height as of 07/08/21: 5\' 9"  (1.753 m).   Weight as of 07/08/21: 92.5  kg.   Imaging Review Plain radiographs demonstrate severe degenerative joint disease of the right knee(s). The overall alignment isneutral. The bone quality appears to be good for age and reported activity level.      Assessment/Plan:  End stage primary arthritis, right knee   The patient history, physical examination, clinical judgment of the provider and imaging studies are consistent with end stage degenerative joint disease of the right knee(s) and total knee arthroplasty is deemed medically necessary. The treatment options including medical management, injection therapy arthroscopy and arthroplasty were discussed at length. The risks and benefits of total knee arthroplasty  were presented and reviewed. The risks due to aseptic loosening, infection, stiffness, patella tracking problems, thromboembolic complications and other imponderables were discussed. The patient acknowledged the explanation, agreed to proceed with the plan and consent was signed. Patient is being admitted for inpatient treatment for surgery, pain control, PT, OT, prophylactic antibiotics, VTE prophylaxis, progressive ambulation and ADL's and discharge planning. The patient is planning to be discharged home with home health services  Patient's anticipated LOS is less than 2 midnights, meeting these requirements: - Younger than 22 - Lives within 1 hour of care - Has a competent adult at home to recover with post-op recover - NO history of  - Chronic pain requiring opiods  - Diabetes  - Coronary Artery Disease  - Heart failure  - Heart attack  - Stroke  - DVT/VTE  - Cardiac arrhythmia  - Respiratory Failure/COPD  - Renal failure  - Anemia  - Advanced Liver disease

## 2021-07-18 NOTE — Progress Notes (Signed)
[facility] OR made aware of the pt's positive covid result.

## 2021-07-20 MED ORDER — BUPIVACAINE LIPOSOME 1.3 % IJ SUSP
20.0000 mL | Freq: Once | INTRAMUSCULAR | Status: DC
Start: 2021-07-21 — End: 2021-07-20
  Filled 2021-07-20: qty 20

## 2021-07-20 MED ORDER — TRANEXAMIC ACID 1000 MG/10ML IV SOLN
2000.0000 mg | INTRAVENOUS | Status: DC
  Filled 2021-07-20: qty 20

## 2021-07-21 ENCOUNTER — Ambulatory Visit (HOSPITAL_COMMUNITY): Admission: RE | Admit: 2021-07-21 | Payer: Medicare Other | Source: Home / Self Care | Admitting: Orthopaedic Surgery

## 2021-07-21 ENCOUNTER — Encounter: Payer: Self-pay | Admitting: Internal Medicine

## 2021-07-21 ENCOUNTER — Other Ambulatory Visit: Payer: Self-pay

## 2021-07-21 ENCOUNTER — Other Ambulatory Visit (HOSPITAL_BASED_OUTPATIENT_CLINIC_OR_DEPARTMENT_OTHER): Payer: Self-pay

## 2021-07-21 ENCOUNTER — Telehealth (INDEPENDENT_AMBULATORY_CARE_PROVIDER_SITE_OTHER): Payer: Medicare Other | Admitting: Internal Medicine

## 2021-07-21 VITALS — Ht 69.0 in

## 2021-07-21 DIAGNOSIS — U071 COVID-19: Secondary | ICD-10-CM

## 2021-07-21 MED ORDER — MOLNUPIRAVIR EUA 200MG CAPSULE
4.0000 | ORAL_CAPSULE | Freq: Two times a day (BID) | ORAL | 0 refills | Status: AC
  Filled 2021-07-21: qty 40, 5d supply, fill #0

## 2021-07-21 NOTE — Progress Notes (Signed)
It appears Patient's surgery was canceled and will need no CPAP at this time.

## 2021-07-21 NOTE — Progress Notes (Signed)
Subjective:    Patient ID: [name] [name], male    DOB: 01-Mar-1946, 75 y.o.   MRN: [id number]  DOS:  07/21/2021 Type of visit - description: Virtual Visit via Video Note  I connected with the above patient  by a video enabled telemedicine application and verified that I am speaking with the correct person using two identifiers.   THIS ENCOUNTER IS A VIRTUAL VISIT DUE TO COVID-19 - PATIENT WAS NOT SEEN IN THE OFFICE. PATIENT HAS CONSENTED TO VIRTUAL VISIT / TELEMEDICINE VISIT   Location of patient: home  Location of provider: office  Persons participating in the virtual visit: patient, provider   I discussed the limitations of evaluation and management by telemedicine and the availability of in person appointments. The patient expressed understanding and agreed to proceed.  Acute Symptoms a started 4 days ago: Dry cough, chills on and off, mild achiness.  His daughter and wife were diagnosed with COVID before him, he tested +4 days ago. Since then, he is not feeling better or worse.  At the end of the visit he also mentioned that developed a rash at his back about the same time he started with COVID symptoms. The rash is itchy, not painful, no blisters.  It is only in the back, no rash at the abdomen, chest, hands or feet.  Not taking any new medicines. Review of Systems Denies nausea, vomiting, diarrhea No chest pain or edema Occasionally feels slightly short of breath Ambulatory CBGs remained stable between 90 and 120  Past Medical History:  Diagnosis Date   Acquired atrophy of thyroid 06/05/2013   Overview:  July 2014: controlled on synthroid 91mcg since 2012 May 2015: decreased to Synthroid 50 mcg  Last Assessment & Plan:  Pt doing well with most recent TSH within normal range.  Will continue current dosage of Synthroid 40mcg. Pt reminded to take medication on empty stomach. Will continue to monitor with periodic laboratory assessment in 3 months.    Anemia    hemoglobin  7.4, iron deficiency, January, 2011, 2 unit transfusion, endoscopy normal, capsule endoscopy February, 2011 no small bowel abnormalities.   Most likely source gastric erosions, followed by GI   Anxiety    Bradycardia    CAD (coronary artery disease)    A. CABG in 2000,status post cardiac cath in 2006, 2009 ....continued chest pain and SOB despite oral medication adjestments including Ranexa. B. Cath November 2009/ mRCA - 2.75 x 23 Abbott Xience V drug-eluting stent ...11/26/2008 to distal  RCA leading to acute marginal.  C. Cath 07/2012 for CP - stable anatomy, med rx. d. cath 2015 and 05/30/2015 stable anatomy, consider Myoview if has CP again   Carotid artery disease ([place]) 08/01/2015   Doppler, May 29, 2015, 1-39% bilateral ICA    Cerebral ischemia    MRI November, 2010, chronic microvascular ischemia   CKD (chronic kidney disease), stage III ([place])    Concussion    Depression    Bipolar   Diabetes mellitus ([place])    Edema    Essential hypertension 06/05/2013   Overview:  July 2014: Controlled with Bystolic Sep 4403: Changed to atenolol.  Last Assessment & Plan:  Will change to atenolol for cost and follow Improved.  Medication compliance strongly encouraged BP: 116/64 mmHg    Overview:  July 2014: Controlled with Bystolic Sep 4742: Changed to atenolol.  Last Assessment & Plan:  Will change to atenolol for cost and follow   Falling episodes    these have  occurred in the past and again recurring 2011   Family history of adverse reaction to anesthesia    "mother died during bypass surgery but not sure if it has to do with anesthesia"   Gastric ulcer    GERD (gastroesophageal reflux disease)    H/O medication noncompliance    Due to loss of insurance   H/O multiple concussions    Hard of hearing    History of blood transfusion 12/20/2013   History of kidney stones    Hx of CABG    2000,  / one median sternotomy suture broken her chest x-ray November, 2010, no clinical significance    Hyperlipidemia    Hypertension    pt. denies   Iron deficiency anemia    Long term (current) use of anticoagulants [Z79.01] 07/20/2016   Low back pain 06/12/2009   Qualifier: Diagnosis of  By: [name]    Nephrolithiasis    Orthostasis    OSA (obstructive sleep apnea)    PAF (paroxysmal atrial fibrillation) ([place])    a. dx 2017, started on amiodarone/warfarin but patient intermittently noncompliant.   PSVT (paroxysmal supraventricular tachycardia) ([place])    RBBB 07/09/2009   Qualifier: Diagnosis of  By: [name], MD, [facility], [name]    RLS (restless legs syndrome) 09/19/2009   Qualifier: Diagnosis of  By: [name]   Overview:  July 2014: Controlled with Mirapex  Last Assessment & Plan:  Patient is doing well. Will continue current management and follow clinically.   Seizure disorder ([place]) 01/09/2017   Spondylosis    C5-6, C6-7 MRI 2010   Syncope 03/2016   TBI (traumatic brain injury) ([place]) 2019   Thyroid disease    Tubulovillous adenoma of colon 2007   Vitamin D deficiency 05/17/2017   Wears glasses     Past Surgical History:  Procedure Laterality Date   ANTERIOR CERVICAL DECOMP/DISCECTOMY FUSION N/A 05/31/2016   Procedure: ANTERIOR CERVICAL DECOMPRESSION/DISCECTOMY FUSION CERVICAL FIVE-SIX,CERVICAL SIX-SEVEN;  Surgeon: [name], MD;  Location: [place] [facility];  Service: Neurosurgery;  Laterality: N/A;   CARDIAC CATHETERIZATION N/A 05/30/2015   Procedure: Left Heart Cath and Coronary Angiography;  Surgeon: [name], MD;  Location: [place] [facility];  Service: Cardiovascular;  Laterality: N/A;   COLONOSCOPY     CORONARY ARTERY BYPASS GRAFT     2000   ESOPHAGOGASTRODUODENOSCOPY     LEFT HEART CATH AND CORS/GRAFTS ANGIOGRAPHY N/A 12/15/2017   Procedure: LEFT HEART CATH AND CORS/GRAFTS ANGIOGRAPHY;  Surgeon: [name], MD;  Location: [facility];  Service: Cardiovascular;  Laterality: N/A;   LEFT HEART CATHETERIZATION WITH CORONARY/GRAFT  ANGIOGRAM N/A 08/01/2012   Procedure: LEFT HEART CATHETERIZATION WITH Beatrix Fetters;  Surgeon: [name], MD;  Location[facility] CATH LAB;  Service: Cardiovascular;  Laterality: N/A;   LEFT HEART CATHETERIZATION WITH CORONARY/GRAFT ANGIOGRAM N/A 01/03/2015   Procedure: LEFT HEART CATHETERIZATION WITH Beatrix Fetters;  Surgeon: [name], MD;  Location: [facility] CATH LAB;  Service: Cardiovascular;  Laterality: N/A;   NASAL SEPTUM SURGERY     UP3   PERCUTANEOUS CORONARY STENT INTERVENTION (PCI-S)  10/2008   mRCA PCI  2.75 x 23 Abbott Xience V drug-eluting stent    RIGHT/LEFT HEART CATH AND CORONARY/GRAFT ANGIOGRAPHY N/A 01/07/2020   Procedure: RIGHT/LEFT HEART CATH AND CORONARY/GRAFT ANGIOGRAPHY;  Surgeon: [name], MD;  Location: [facility] LAB;  Service: Cardiovascular;  Laterality: N/A;   ULTRASOUND GUIDANCE FOR VASCULAR ACCESS  12/15/2017  Procedure: Ultrasound Guidance For Vascular Access;  Surgeon: [name], MD;  Location: [place] CV LAB;  Service: Cardiovascular;;    Allergies as of 07/21/2021       Reactions   Morphine Other (See Comments)   Hallucinations   Shellfish-derived Products Itching        Medication List        Accurate as of July 21, 2021 11:23 AM. If you have any questions, ask your nurse or doctor.          albuterol 108 (90 Base) MCG/ACT inhaler Commonly known as: VENTOLIN HFA Inhale 2 puffs into the lungs every 6 (six) hours as needed for wheezing or shortness of breath.   amLODipine 5 MG tablet Commonly known as: NORVASC TAKE 1 TABLET BY MOUTH ONCE DAILY   aspirin EC 81 MG tablet Take 81 mg by mouth daily. Swallow whole.   atorvastatin 80 MG tablet Commonly known as: LIPITOR Take 1 tablet (80 mg total) by mouth daily. What changed: when to take this   busPIRone 7.5 MG tablet Commonly known as: BUSPAR Take 1 tablet (7.5 mg total) by mouth 2 (two) times daily.   cetirizine 10 MG tablet Commonly  known as: ZYRTEC TAKE 1 TABLET (10 MG TOTAL) BY MOUTH DAILY.   divalproex 500 MG DR tablet Commonly known as: Depakote Take 1 tablet (500 mg total) by mouth 2 (two) times daily.   Eliquis 5 MG Tabs tablet Generic drug: apixaban Take 1 tablet (5 mg total) by mouth 2 (two) times daily.   famotidine 20 MG tablet Commonly known as: PEPCID TAKE 1 TABLET BY MOUTH 2 TIMES DAILY   fenofibrate 145 MG tablet Commonly known as: TRICOR TAKE 1 TABLET (145 MG TOTAL) BY MOUTH DAILY.   FreeStyle Libre 14 Day Sensor Misc USE TO CHECK BLOOD SUGAR AND CHANGE EVERY 14 DAYS   furosemide 20 MG tablet Commonly known as: LASIX TAKE 1 TABLET BY MOUTH DAILY AS NEEDED FOR EDEMA   glucose blood test strip Commonly known as: Accu-Chek Aviva Use as directed to check blood sugar 4 times daily.  DX E11.9   HYDROcodone-acetaminophen 5-325 MG tablet Commonly known as: NORCO/VICODIN TAKE 1 TABLET BY MOUTH EVERY 8 (EIGHT) HOURS AS NEEDED.   hydrocortisone 2.5 % cream Apply on to the skin 2 (two) times daily.   hydrOXYzine 10 MG tablet Commonly known as: ATARAX/VISTARIL Take 1 tablet (10 mg total) by mouth 3 (three) times daily as needed for anxiety or itching.   Insulin Pen Needle 31G X 8 MM Misc use to inject insulin twice a day   isosorbide mononitrate 120 MG 24 hr tablet Commonly known as: IMDUR TAKE 1 TABLET (120 MG TOTAL) BY MOUTH DAILY.   levothyroxine 100 MCG tablet Commonly known as: SYNTHROID TAKE 1 TABLET (100 MCG TOTAL) BY MOUTH DAILY.   metoprolol succinate 50 MG 24 hr tablet Commonly known as: TOPROL-XL TAKE 1 AND 1/2 TABLETS EVERY DAY FOR A TOTAL OF 75 MG   nitroGLYCERIN 0.4 MG SL tablet Commonly known as: NITROSTAT PLACE 1 TABLET UNDER THE TONGUE EVERY 5 MINUTES AS NEEDED FOR CHEST PAIN.   pantoprazole 40 MG tablet Commonly known as: PROTONIX TAKE 1 TABLET BY MOUTH EVERY DAY   Pennsaid 2 % Soln Generic drug: Diclofenac Sodium Place 1 application onto the skin 2 (two)  times daily. What changed:  when to take this reasons to take this   pramipexole 1 MG tablet Commonly known as: MIRAPEX TAKE 1/2 TO 1  TABLET AT BEDTIME What changed: See the new instructions.   ranolazine 500 MG 12 hr tablet Commonly known as: RANEXA TAKE 1 TABLET BY MOUTH TWICE DAILY   sertraline 100 MG tablet Commonly known as: ZOLOFT TAKE 1 TABLET BY MOUTH TWICE DAILY   tiZANidine 4 MG tablet Commonly known as: ZANAFLEX TAKE 1 TABLET BY MOUTH EVERY 6 HOURS AS NEEDED FOR MUSCLE SPASMS   Toujeo Max SoloStar 300 UNIT/ML Solostar Pen Generic drug: insulin glargine (2 Unit Dial) Inject 130 Units into the skin every morning.   triamcinolone cream 0.1 % Commonly known as: KENALOG Apply 1 application topically 2 (two) times daily. Use as needed for itchy rash   UltiCare Insulin Syringe 31G X 5/16" 1 ML Misc Generic drug: Insulin Syringe-Needle U-100 USE AS DIRECTED WITH NOVOLIN   Vitamin D (Ergocalciferol) 1.25 MG (50000 UNIT) Caps capsule Commonly known as: DRISDOL TAKE 1 CAPSULE BY MOUTH EVERY 7 DAYS           Objective:   Physical Exam Ht 5\' 9"  (1.753 m)   BMI 30.13 kg/m  This is a virtual video visit, no vital signs available, no O2 sats available, he is an elderly gentleman, in no distress, speaking in complete sentences. No cough noted. The daughter helped with the visit, she was able to focus the camera on the back, I saw small red patches possibly in a Christmas tree distribution.      Assessment      75 year old gentleman, PMH includes CAD, atrial fibrillation, carotid artery disease, CHF, HTN, OSA, DM, GERD, anticoagulated, and on multiple medications, presents with.   COVID-19: Symptoms a started 4 days ago, he has multiple comorbidities and take multiple medications.  He has 4 COVID vaccines. No vital signs or O2 sat available. He does not look distressed. We agreed on the start molnupiravir to decrease his chances to get sicker. Also close  observation of symptoms, definitely call if he is not gradually better or if he has severe symptoms. Good hydration also recommended along with Tylenol and Robitussin if needed. Rash: My exam is limited, done through the video, based on the distribution   it seems to be ptyriasis  rosea.  For now recommend observation, call if worse.  I discussed the assessment and treatment plan with the patient. The patient was provided an opportunity to ask questions and all were answered. The patient agreed with the plan and demonstrated an understanding of the instructions.   The patient was advised to call back or seek an in-person evaluation if the symptoms worsen or if the condition fails to improve as anticipated.

## 2021-07-23 ENCOUNTER — Other Ambulatory Visit (HOSPITAL_BASED_OUTPATIENT_CLINIC_OR_DEPARTMENT_OTHER): Payer: Self-pay

## 2021-07-23 MED FILL — Continuous Glucose System Sensor: 28 days supply | Qty: 2 | Fill #2 | Status: CN

## 2021-07-24 ENCOUNTER — Other Ambulatory Visit (HOSPITAL_BASED_OUTPATIENT_CLINIC_OR_DEPARTMENT_OTHER): Payer: Self-pay

## 2021-07-24 MED ORDER — TRIAMCINOLONE ACETONIDE 0.5 % EX CREA
TOPICAL_CREAM | Freq: Two times a day (BID) | CUTANEOUS | 0 refills | Status: DC
  Filled 2021-07-24: qty 15, 14d supply, fill #0

## 2021-07-29 ENCOUNTER — Other Ambulatory Visit (HOSPITAL_BASED_OUTPATIENT_CLINIC_OR_DEPARTMENT_OTHER): Payer: Self-pay

## 2021-08-07 ENCOUNTER — Other Ambulatory Visit (HOSPITAL_BASED_OUTPATIENT_CLINIC_OR_DEPARTMENT_OTHER): Payer: Self-pay

## 2021-08-12 ENCOUNTER — Other Ambulatory Visit (HOSPITAL_BASED_OUTPATIENT_CLINIC_OR_DEPARTMENT_OTHER): Payer: Self-pay

## 2021-08-14 ENCOUNTER — Other Ambulatory Visit (HOSPITAL_BASED_OUTPATIENT_CLINIC_OR_DEPARTMENT_OTHER): Payer: Self-pay

## 2021-08-14 MED FILL — Continuous Glucose System Sensor: 14 days supply | Qty: 1 | Fill #2 | Status: AC

## 2021-08-14 MED FILL — Continuous Glucose System Sensor: 28 days supply | Qty: 2 | Fill #2 | Status: CN

## 2021-08-19 ENCOUNTER — Other Ambulatory Visit (HOSPITAL_BASED_OUTPATIENT_CLINIC_OR_DEPARTMENT_OTHER): Payer: Self-pay

## 2021-08-19 MED FILL — Ranolazine Tab ER 12HR 500 MG: ORAL | 30 days supply | Qty: 60 | Fill #4 | Status: AC

## 2021-08-27 ENCOUNTER — Ambulatory Visit (INDEPENDENT_AMBULATORY_CARE_PROVIDER_SITE_OTHER): Payer: Medicare Other | Admitting: Endocrinology

## 2021-08-27 ENCOUNTER — Other Ambulatory Visit: Payer: Self-pay

## 2021-08-27 ENCOUNTER — Ambulatory Visit: Payer: Medicare Other | Admitting: Family Medicine

## 2021-08-27 VITALS — BP 110/40 | HR 57 | Ht 69.0 in | Wt 203.0 lb

## 2021-08-27 DIAGNOSIS — E1165 Type 2 diabetes mellitus with hyperglycemia: Secondary | ICD-10-CM

## 2021-08-27 LAB — POCT GLYCOSYLATED HEMOGLOBIN (HGB A1C): Hemoglobin A1C: 7.5 % — AB (ref 4.0–5.6)

## 2021-08-27 NOTE — Progress Notes (Signed)
Subjective:    Patient ID: [name] [name], male    DOB: May 21, 1946, 75 y.o.   MRN: [id number]  HPI Pt returns for f/u of diabetes mellitus:  DM type: Insulin-requiring type 2.  Dx'ed: 4098 Complications: PN, stage 3b CRI, CAD and PAD.   Therapy: insulin since 2008.   DKA: never Severe hypoglycemia: once (2022) Pancreatitis: never Pancreatic imaging: 2017 CT showed fatty replacement of the pancreatic head and body.   SDOH: he could not afford Trulicity.   Other: he takes QD insulin, after poor results with multiple daily injections; he uses FL continuous glucose monitor.   Interval history: I reviewed continuous glucose monitor data.  Glucose varies from 40-320.  It decreases overnight, but there is little trend throughout the day.  He needs an A1c of 7.5 or better for TKR.   Past Medical History:  Diagnosis Date   Acquired atrophy of thyroid 06/05/2013   Overview:  July 2014: controlled on synthroid 40mcg since 2012 May 2015: decreased to Synthroid 50 mcg  Last Assessment & Plan:  Pt doing well with most recent TSH within normal range.  Will continue current dosage of Synthroid 54mcg. Pt reminded to take medication on empty stomach. Will continue to monitor with periodic laboratory assessment in 3 months.    Anemia    hemoglobin 7.4, iron deficiency, January, 2011, 2 unit transfusion, endoscopy normal, capsule endoscopy February, 2011 no small bowel abnormalities.   Most likely source gastric erosions, followed by GI   Anxiety    Bradycardia    CAD (coronary artery disease)    A. CABG in 2000,status post cardiac cath in 2006, 2009 ....continued chest pain and SOB despite oral medication adjestments including Ranexa. B. Cath November 2009/ mRCA - 2.75 x 23 Abbott Xience V drug-eluting stent ...11/26/2008 to distal  RCA leading to acute marginal.  C. Cath 07/2012 for CP - stable anatomy, med rx. d. cath 2015 and 05/30/2015 stable anatomy, consider Myoview if has CP again   Carotid artery  disease ([place]) 08/01/2015   Doppler, May 29, 2015, 1-39% bilateral ICA    Cerebral ischemia    MRI November, 2010, chronic microvascular ischemia   CKD (chronic kidney disease), stage III ([place])    Concussion    Depression    Bipolar   Diabetes mellitus ([place])    Edema    Essential hypertension 06/05/2013   Overview:  July 2014: Controlled with Bystolic Sep 1191: Changed to atenolol.  Last Assessment & Plan:  Will change to atenolol for cost and follow Improved.  Medication compliance strongly encouraged BP: 116/64 mmHg    Overview:  July 2014: Controlled with Bystolic Sep 4782: Changed to atenolol.  Last Assessment & Plan:  Will change to atenolol for cost and follow   Falling episodes    these have occurred in the past and again recurring 2011   Family history of adverse reaction to anesthesia    "mother died during bypass surgery but not sure if it has to do with anesthesia"   Gastric ulcer    GERD (gastroesophageal reflux disease)    H/O medication noncompliance    Due to loss of insurance   H/O multiple concussions    Hard of hearing    History of blood transfusion 12/20/2013   History of kidney stones    Hx of CABG    2000,  / one median sternotomy suture broken her chest x-ray November, 2010, no clinical significance   Hyperlipidemia    Hypertension  pt. denies   Iron deficiency anemia    Long term (current) use of anticoagulants [Z79.01] 07/20/2016   Low back pain 06/12/2009   Qualifier: Diagnosis of  By: [name]    Nephrolithiasis    Orthostasis    OSA (obstructive sleep apnea)    PAF (paroxysmal atrial fibrillation) ([place])    a. dx 2017, started on amiodarone/warfarin but patient intermittently noncompliant.   PSVT (paroxysmal supraventricular tachycardia) ([place])    RBBB 07/09/2009   Qualifier: Diagnosis of  By: [name], MD, [facility], [name]    RLS (restless legs syndrome) 09/19/2009   Qualifier: Diagnosis of  By: [name]   Overview:  July 2014:  Controlled with Mirapex  Last Assessment & Plan:  Patient is doing well. Will continue current management and follow clinically.   Seizure disorder ([place]) 01/09/2017   Spondylosis    C5-6, C6-7 MRI 2010   Syncope 03/2016   TBI (traumatic brain injury) ([place]) 2019   Thyroid disease    Tubulovillous adenoma of colon 2007   Vitamin D deficiency 05/17/2017   Wears glasses     Past Surgical History:  Procedure Laterality Date   ANTERIOR CERVICAL DECOMP/DISCECTOMY FUSION N/A 05/31/2016   Procedure: ANTERIOR CERVICAL DECOMPRESSION/DISCECTOMY FUSION CERVICAL FIVE-SIX,CERVICAL SIX-SEVEN;  Surgeon: [name], MD;  Location: [place] [facility];  Service: Neurosurgery;  Laterality: N/A;   CARDIAC CATHETERIZATION N/A 05/30/2015   Procedure: Left Heart Cath and Coronary Angiography;  Surgeon: [name], MD;  Location: [place] [facility];  Service: Cardiovascular;  Laterality: N/A;   COLONOSCOPY     CORONARY ARTERY BYPASS GRAFT     2000   ESOPHAGOGASTRODUODENOSCOPY     LEFT HEART CATH AND CORS/GRAFTS ANGIOGRAPHY N/A 12/15/2017   Procedure: LEFT HEART CATH AND CORS/GRAFTS ANGIOGRAPHY;  Surgeon: [name], MD;  Location: [place] [facility];  Service: Cardiovascular;  Laterality: N/A;   LEFT HEART CATHETERIZATION WITH CORONARY/GRAFT ANGIOGRAM N/A 08/01/2012   Procedure: LEFT HEART CATHETERIZATION WITH Beatrix Fetters;  Surgeon: [name], MD;  Location[facility];  Service: Cardiovascular;  Laterality: N/A;   LEFT HEART CATHETERIZATION WITH CORONARY/GRAFT ANGIOGRAM N/A 01/03/2015   Procedure: LEFT HEART CATHETERIZATION WITH Beatrix Fetters;  Surgeon: [name], MD;  Location: [facility] CATH LAB;  Service: Cardiovascular;  Laterality: N/A;   NASAL SEPTUM SURGERY     UP3   PERCUTANEOUS CORONARY STENT INTERVENTION (PCI-S)  10/2008   mRCA PCI  2.75 x 23 Abbott Xience V drug-eluting stent    RIGHT/LEFT HEART CATH AND CORONARY/GRAFT ANGIOGRAPHY N/A 01/07/2020   Procedure:  RIGHT/LEFT HEART CATH AND CORONARY/GRAFT ANGIOGRAPHY;  Surgeon: [name], MD;  Location: [place] CV LAB;  Service: Cardiovascular;  Laterality: N/A;   ULTRASOUND GUIDANCE FOR VASCULAR ACCESS  12/15/2017   Procedure: Ultrasound Guidance For Vascular Access;  Surgeon: [name], MD;  Location: [place] CV LAB;  Service: Cardiovascular;;    Social History   Socioeconomic History   Marital status: Married    Spouse name: Not on file   Number of children: 4   Years of education: 13   Highest education level: Not on file  Occupational History   Occupation: retired  Tobacco Use   Smoking status: Never   Smokeless tobacco: Never  Vaping Use   Vaping Use: Never used  Substance and Sexual Activity   Alcohol use: No    Alcohol/week: 0.0 standard drinks    Comment: stopped drinking in 1998  Drug use: No   Sexual activity: Not on file  Other Topics Concern   Not on file  Social History Narrative   Patient is right handed.   Patient does not drink caffeine.   Social Determinants of Health   Financial Resource Strain: Not on file  Food Insecurity: Not on file  Transportation Needs: Not on file  Physical Activity: Not on file  Stress: Not on file  Social Connections: Not on file  Intimate Partner Violence: Not on file    Current Outpatient Medications on File Prior to Visit  Medication Sig Dispense Refill   albuterol (VENTOLIN HFA) 108 (90 Base) MCG/ACT inhaler Inhale 2 puffs into the lungs every 6 (six) hours as needed for wheezing or shortness of breath. 18 g 2   amLODipine (NORVASC) 5 MG tablet TAKE 1 TABLET BY MOUTH ONCE DAILY 90 tablet 2   apixaban (ELIQUIS) 5 MG TABS tablet Take 1 tablet (5 mg total) by mouth 2 (two) times daily. 60 tablet 5   aspirin EC 81 MG tablet Take 81 mg by mouth daily. Swallow whole.     atorvastatin (LIPITOR) 80 MG tablet Take 1 tablet (80 mg total) by mouth daily. (Patient taking differently: Take 80 mg by mouth at bedtime.) 90  tablet 3   busPIRone (BUSPAR) 7.5 MG tablet Take 1 tablet (7.5 mg total) by mouth 2 (two) times daily. 60 tablet 1   cetirizine (ZYRTEC) 10 MG tablet TAKE 1 TABLET (10 MG TOTAL) BY MOUTH DAILY. 100 tablet 2   Continuous Blood Gluc Sensor (FREESTYLE LIBRE 14 DAY SENSOR) MISC USE TO CHECK BLOOD SUGAR AND CHANGE EVERY 14 DAYS 6 each 3   Diclofenac Sodium (PENNSAID) 2 % SOLN Place 1 application onto the skin 2 (two) times daily. (Patient taking differently: Place 1 application onto the skin 2 (two) times daily as needed (pain).) 112 g 2   divalproex (DEPAKOTE) 500 MG DR tablet Take 1 tablet (500 mg total) by mouth 2 (two) times daily. 60 tablet 5   famotidine (PEPCID) 20 MG tablet TAKE 1 TABLET BY MOUTH 2 TIMES DAILY 60 tablet 2   fenofibrate (TRICOR) 145 MG tablet TAKE 1 TABLET (145 MG TOTAL) BY MOUTH DAILY. 90 tablet 1   furosemide (LASIX) 20 MG tablet TAKE 1 TABLET BY MOUTH DAILY AS NEEDED FOR EDEMA 30 tablet 3   glucose blood (ACCU-CHEK AVIVA) test strip Use as directed to check blood sugar 4 times daily.  DX E11.9 200 each 6   hydrocortisone 2.5 % cream Apply on to the skin 2 (two) times daily. 30 g 1   hydrOXYzine (ATARAX/VISTARIL) 10 MG tablet Take 1 tablet (10 mg total) by mouth 3 (three) times daily as needed for anxiety or itching. 30 tablet 1   insulin glargine, 2 Unit Dial, (TOUJEO MAX SOLOSTAR) 300 UNIT/ML Solostar Pen Inject 130 Units into the skin every morning. 48 mL 3   Insulin Pen Needle 31G X 8 MM MISC use to inject insulin twice a day 100 each 3   Insulin Syringe-Needle U-100 31G X 5/16" 1 ML MISC USE AS DIRECTED WITH NOVOLIN 100 each 0   isosorbide mononitrate (IMDUR) 120 MG 24 hr tablet TAKE 1 TABLET (120 MG TOTAL) BY MOUTH DAILY. 90 tablet 3   levothyroxine (SYNTHROID) 100 MCG tablet TAKE 1 TABLET (100 MCG TOTAL) BY MOUTH DAILY. 90 tablet 1   metoprolol succinate (TOPROL-XL) 50 MG 24 hr tablet TAKE 1 AND 1/2 TABLETS EVERY DAY FOR A TOTAL OF 75  MG 135 tablet 3   nitroGLYCERIN  (NITROSTAT) 0.4 MG SL tablet PLACE 1 TABLET UNDER THE TONGUE EVERY 5 MINUTES AS NEEDED FOR CHEST PAIN. 25 tablet 1   pantoprazole (PROTONIX) 40 MG tablet TAKE 1 TABLET BY MOUTH EVERY DAY 90 tablet 3   pramipexole (MIRAPEX) 1 MG tablet TAKE 1/2 TO 1 TABLET AT BEDTIME (Patient taking differently: Take 1 mg by mouth at bedtime.) 90 tablet 0   ranolazine (RANEXA) 500 MG 12 hr tablet TAKE 1 TABLET BY MOUTH TWICE DAILY 60 tablet 5   sertraline (ZOLOFT) 100 MG tablet TAKE 1 TABLET BY MOUTH TWICE DAILY 180 tablet 1   tiZANidine (ZANAFLEX) 4 MG tablet TAKE 1 TABLET BY MOUTH EVERY 6 HOURS AS NEEDED FOR MUSCLE SPASMS 40 tablet 2   triamcinolone cream (KENALOG) 0.1 % Apply 1 application topically 2 (two) times daily. Use as needed for itchy rash 30 g 1   triamcinolone cream (KENALOG) 0.5 % Apply 1 application on the skin twice a day 15 g 0   Vitamin D, Ergocalciferol, (DRISDOL) 1.25 MG (50000 UNIT) CAPS capsule TAKE 1 CAPSULE BY MOUTH EVERY 7 DAYS 4 capsule 4   No current facility-administered medications on file prior to visit.    Allergies  Allergen Reactions   Morphine Other (See Comments)    Hallucinations   Shellfish-Derived Products Itching    Family History  Problem Relation Age of Onset   Pancreatic cancer Brother    Diabetes Brother    Coronary artery disease Brother    Stroke Brother    Diabetes Brother    Diabetes Mother    Heart failure Mother    Heart failure Father    Hypothyroidism Brother    Coronary artery disease Brother    Other Brother        colon surgery   Heart attack Other        Nephew   Irregular heart beat Daughter    Cancer Maternal Grandmother        unknown    Colon cancer Neg Hx    Stomach cancer Neg Hx    Liver cancer Neg Hx    Rectal cancer Neg Hx    Esophageal cancer Neg Hx     BP (!) 110/40 (BP Location: Right Arm, Patient Position: Sitting, Cuff Size: Large)   Pulse (!) 57   Ht 5\' 9"  (1.753 m)   Wt 203 lb (92.1 kg)   SpO2 97%   BMI 29.98  kg/m    Review of Systems     Objective:   Physical Exam Pulses: dorsalis pedis intact bilat.   MSK: no deformity of the feet CV: 1+ bilat leg edema Skin:  no ulcer on the feet.  normal color and temp on the feet. Neuro: sensation is intact to touch on the feet.  Ext: there is bilateral onychomycosis of the toenails.   Lab Results  Component Value Date   HGBA1C 7.5 (A) 08/27/2021        Assessment & Plan:  Insulin-requiring type 2 DM. Hypoglycemia, due to insulin: we can't decrease insulin now, due to need to be cleared for surgery.  Patient Instructions  check your blood sugar twice a day.  vary the time of day when you check, between before the 3 meals, and at bedtime.  also check if you have symptoms of your blood sugar being too high or too low.  please keep a record of the readings and bring it to your next appointment here (  or you can bring the meter itself).  You can write it on any piece of paper.  please call us sooner if your blood sugar goes below 70, or if you have a lot of readings over 200.   Please reduce Toujeo to 130 units each morning.   Please come back for a follow-up appointment in 2 months.   Please try to buy just enough Toujeo to get you to your next appointment, as we may need to change to a different insulin next time.

## 2021-08-27 NOTE — Patient Instructions (Signed)
check your blood sugar twice a day.  vary the time of day when you check, between before the 3 meals, and at bedtime.  also check if you have symptoms of your blood sugar being too high or too low.  please keep a record of the readings and bring it to your next appointment here (or you can bring the meter itself).  You can write it on any piece of paper.  please call us sooner if your blood sugar goes below 70, or if you have a lot of readings over 200.   Please reduce Toujeo to 130 units each morning.   Please come back for a follow-up appointment in 2 months.   Please try to buy just enough Toujeo to get you to your next appointment, as we may need to change to a different insulin next time.

## 2021-08-28 ENCOUNTER — Other Ambulatory Visit (HOSPITAL_BASED_OUTPATIENT_CLINIC_OR_DEPARTMENT_OTHER): Payer: Self-pay

## 2021-08-28 MED FILL — Continuous Glucose System Sensor: 14 days supply | Qty: 1 | Fill #3 | Status: AC

## 2021-08-30 ENCOUNTER — Emergency Department (HOSPITAL_COMMUNITY): Payer: Medicare Other

## 2021-08-30 ENCOUNTER — Other Ambulatory Visit: Payer: Self-pay

## 2021-08-30 ENCOUNTER — Encounter (HOSPITAL_BASED_OUTPATIENT_CLINIC_OR_DEPARTMENT_OTHER): Payer: Self-pay | Admitting: *Deleted

## 2021-08-30 ENCOUNTER — Emergency Department (HOSPITAL_BASED_OUTPATIENT_CLINIC_OR_DEPARTMENT_OTHER)
Admission: EM | Admit: 2021-08-30 | Discharge: 2021-08-31 | Disposition: A | Discharge status: Home / Self Care | Payer: Medicare Other | Attending: Emergency Medicine | Admitting: Emergency Medicine

## 2021-08-30 ENCOUNTER — Emergency Department (HOSPITAL_BASED_OUTPATIENT_CLINIC_OR_DEPARTMENT_OTHER): Payer: Medicare Other

## 2021-08-30 DIAGNOSIS — Z79899 Other long term (current) drug therapy: Secondary | ICD-10-CM | POA: Diagnosis not present

## 2021-08-30 DIAGNOSIS — Z7982 Long term (current) use of aspirin: Secondary | ICD-10-CM | POA: Diagnosis not present

## 2021-08-30 DIAGNOSIS — D631 Anemia in chronic kidney disease: Secondary | ICD-10-CM | POA: Insufficient documentation

## 2021-08-30 DIAGNOSIS — I5032 Chronic diastolic (congestive) heart failure: Secondary | ICD-10-CM | POA: Diagnosis not present

## 2021-08-30 DIAGNOSIS — I130 Hypertensive heart and chronic kidney disease with heart failure and stage 1 through stage 4 chronic kidney disease, or unspecified chronic kidney disease: Secondary | ICD-10-CM | POA: Diagnosis not present

## 2021-08-30 DIAGNOSIS — R270 Ataxia, unspecified: Secondary | ICD-10-CM

## 2021-08-30 DIAGNOSIS — Z794 Long term (current) use of insulin: Secondary | ICD-10-CM | POA: Insufficient documentation

## 2021-08-30 DIAGNOSIS — R42 Dizziness and giddiness: Secondary | ICD-10-CM | POA: Diagnosis present

## 2021-08-30 DIAGNOSIS — I6782 Cerebral ischemia: Secondary | ICD-10-CM | POA: Insufficient documentation

## 2021-08-30 DIAGNOSIS — N184 Chronic kidney disease, stage 4 (severe): Secondary | ICD-10-CM | POA: Diagnosis not present

## 2021-08-30 DIAGNOSIS — Z7901 Long term (current) use of anticoagulants: Secondary | ICD-10-CM | POA: Diagnosis not present

## 2021-08-30 DIAGNOSIS — R110 Nausea: Secondary | ICD-10-CM | POA: Insufficient documentation

## 2021-08-30 DIAGNOSIS — I2510 Atherosclerotic heart disease of native coronary artery without angina pectoris: Secondary | ICD-10-CM | POA: Diagnosis not present

## 2021-08-30 DIAGNOSIS — I480 Paroxysmal atrial fibrillation: Secondary | ICD-10-CM | POA: Diagnosis not present

## 2021-08-30 DIAGNOSIS — E1122 Type 2 diabetes mellitus with diabetic chronic kidney disease: Secondary | ICD-10-CM | POA: Insufficient documentation

## 2021-08-30 DIAGNOSIS — Z951 Presence of aortocoronary bypass graft: Secondary | ICD-10-CM | POA: Diagnosis not present

## 2021-08-30 LAB — URINALYSIS, MICROSCOPIC (REFLEX): Bacteria, UA: NONE SEEN

## 2021-08-30 LAB — URINALYSIS, ROUTINE W REFLEX MICROSCOPIC
Bilirubin Urine: NEGATIVE
Glucose, UA: >=500 mg/dL — AB
Hgb urine dipstick: NEGATIVE
Ketones, ur: NEGATIVE mg/dL
Leukocytes,Ua: NEGATIVE
Nitrite: NEGATIVE
Protein, ur: NEGATIVE mg/dL
Specific Gravity, Urine: 1.01 (ref 1.005–1.030)
pH: 6.5 (ref 5.0–8.0)

## 2021-08-30 LAB — CBC
HCT: 36.8 % — ABNORMAL LOW (ref 39.0–52.0)
Hemoglobin: 12.6 g/dL — ABNORMAL LOW (ref 13.0–17.0)
MCH: 30.2 pg (ref 26.0–34.0)
MCHC: 34.2 g/dL (ref 30.0–36.0)
MCV: 88.2 fL (ref 80.0–100.0)
Platelets: 190 10*3/uL (ref 150–400)
RBC: 4.17 MIL/uL — ABNORMAL LOW (ref 4.22–5.81)
RDW: 13.7 % (ref 11.5–15.5)
WBC: 7.5 10*3/uL (ref 4.0–10.5)
nRBC: 0 % (ref 0.0–0.2)

## 2021-08-30 LAB — HEPATIC FUNCTION PANEL
ALT: 15 U/L (ref 0–44)
AST: 18 U/L (ref 15–41)
Albumin: 4 g/dL (ref 3.5–5.0)
Alkaline Phosphatase: 43 U/L (ref 38–126)
Bilirubin, Direct: 0.2 mg/dL (ref 0.0–0.2)
Indirect Bilirubin: 0.4 mg/dL (ref 0.3–0.9)
Total Bilirubin: 0.6 mg/dL (ref 0.3–1.2)
Total Protein: 6.6 g/dL (ref 6.5–8.1)

## 2021-08-30 LAB — BASIC METABOLIC PANEL
Anion gap: 8 (ref 5–15)
BUN: 22 mg/dL (ref 8–23)
CO2: 25 mmol/L (ref 22–32)
Calcium: 9.4 mg/dL (ref 8.9–10.3)
Chloride: 105 mmol/L (ref 98–111)
Creatinine, Ser: 1.56 mg/dL — ABNORMAL HIGH (ref 0.61–1.24)
GFR, Estimated: 47 mL/min — ABNORMAL LOW (ref >60)
Glucose, Bld: 229 mg/dL — ABNORMAL HIGH (ref 70–99)
Potassium: 4.3 mmol/L (ref 3.5–5.1)
Sodium: 138 mmol/L (ref 135–145)

## 2021-08-30 LAB — TROPONIN I (HIGH SENSITIVITY): Troponin I (High Sensitivity): 6 ng/L (ref <18)

## 2021-08-30 LAB — CBG MONITORING, ED
Glucose-Capillary: 220 mg/dL — ABNORMAL HIGH (ref 70–99)
Glucose-Capillary: 55 mg/dL — ABNORMAL LOW (ref 70–99)

## 2021-08-30 LAB — BRAIN NATRIURETIC PEPTIDE: B Natriuretic Peptide: 110.7 pg/mL — ABNORMAL HIGH (ref 0.0–100.0)

## 2021-08-30 LAB — MAGNESIUM: Magnesium: 1.9 mg/dL (ref 1.7–2.4)

## 2021-08-30 MED ORDER — LACTATED RINGERS IV BOLUS
500.0000 mL | Freq: Once | INTRAVENOUS | Status: AC
  Administered 2021-08-30: 500 mL via INTRAVENOUS

## 2021-08-30 MED ORDER — MECLIZINE HCL 25 MG PO TABS
25.0000 mg | ORAL_TABLET | Freq: Once | ORAL | Status: AC
  Administered 2021-08-30: 25 mg via ORAL
  Filled 2021-08-30: qty 1

## 2021-08-30 MED ORDER — GADOBUTROL 1 MMOL/ML IV SOLN
10.0000 mL | Freq: Once | INTRAVENOUS | Status: AC | PRN
  Administered 2021-08-30: 10 mL via INTRAVENOUS

## 2021-08-30 NOTE — ED Provider Notes (Signed)
Patient transferred here from [place] for MRI due to persistent dizziness and unsteady gait.  States dizzy with standing and feeling of almost no balance.  He was to the point of crawling across the floor at home this morning.  No other focal deficits noted on initial exam.  Labs were overall reassuring, was not orthostatic.  Discussed with neuro, recommended MRI brain MRA head/neck.  Results for orders placed or performed during the hospital encounter of 53/29/92  Basic metabolic panel  Result Value Ref Range   Sodium 138 135 - 145 mmol/L   Potassium 4.3 3.5 - 5.1 mmol/L   Chloride 105 98 - 111 mmol/L   CO2 25 22 - 32 mmol/L   Glucose, Bld 229 (H) 70 - 99 mg/dL   BUN 22 8 - 23 mg/dL   Creatinine, Ser 1.56 (H) 0.61 - 1.24 mg/dL   Calcium 9.4 8.9 - 10.3 mg/dL   GFR, Estimated 47 (L) >60 mL/min   Anion gap 8 5 - 15  CBC  Result Value Ref Range   WBC 7.5 4.0 - 10.5 K/uL   RBC 4.17 (L) 4.22 - 5.81 MIL/uL   Hemoglobin 12.6 (L) 13.0 - 17.0 g/dL   HCT 36.8 (L) 39.0 - 52.0 %   MCV 88.2 80.0 - 100.0 fL   MCH 30.2 26.0 - 34.0 pg   MCHC 34.2 30.0 - 36.0 g/dL   RDW 13.7 11.5 - 15.5 %   Platelets 190 150 - 400 K/uL   nRBC 0.0 0.0 - 0.2 %  Magnesium  Result Value Ref Range   Magnesium 1.9 1.7 - 2.4 mg/dL  Hepatic function panel  Result Value Ref Range   Total Protein 6.6 6.5 - 8.1 g/dL   Albumin 4.0 3.5 - 5.0 g/dL   AST 18 15 - 41 U/L   ALT 15 0 - 44 U/L   Alkaline Phosphatase 43 38 - 126 U/L   Total Bilirubin 0.6 0.3 - 1.2 mg/dL   Bilirubin, Direct 0.2 0.0 - 0.2 mg/dL   Indirect Bilirubin 0.4 0.3 - 0.9 mg/dL  Brain natriuretic peptide  Result Value Ref Range   B Natriuretic Peptide 110.7 (H) 0.0 - 100.0 pg/mL  CBG monitoring, ED  Result Value Ref Range   Glucose-Capillary 220 (H) 70 - 99 mg/dL  Troponin I (High Sensitivity)  Result Value Ref Range   Troponin I (High Sensitivity) 6 <18 ng/L   *Note: Due to a large number of results and/or encounters for the requested time  period, some results have not been displayed. A complete set of results can be found in Results Review.   CT HEAD WO CONTRAST (5MM)  Result Date: 08/30/2021 CLINICAL DATA:  Dizziness EXAM: CT HEAD WITHOUT CONTRAST TECHNIQUE: Contiguous axial images were obtained from the base of the skull through the vertex without intravenous contrast. COMPARISON:  CT 05/08/2021 FINDINGS: Brain: No acute territorial infarction, hemorrhage or intracranial mass. There is atrophy and mild chronic small vessel ischemic change of the white matter. The ventricles are stable size Vascular: No hyperdense vessels.  Carotid vascular calcification Skull: Normal. Negative for fracture or focal lesion. Sinuses/Orbits: No acute finding. Other: None IMPRESSION: 1. No CT evidence for acute intracranial abnormality. 2. Atrophy and chronic small vessel ischemic changes of the white matter. Electronically Signed   By: Donavan Foil [name]   On: 08/30/2021 16:13   MR ANGIO HEAD WO CONTRAST  Result Date: 08/30/2021 CLINICAL DATA:  Initial evaluation for neuro deficit, stroke suspected. EXAM: MRI HEAD WITHOUT  CONTRAST MRA HEAD WITHOUT CONTRAST MRA NECK WITHOUT AND WITH CONTRAST TECHNIQUE: Multiplanar, multi-echo pulse sequences of the brain and surrounding structures were acquired without intravenous contrast. Angiographic images of the Circle of Willis were acquired using MRA technique without intravenous contrast. Angiographic images of the neck were acquired using MRA technique without and with intravenous contrast. Carotid stenosis measurements (when applicable) are obtained utilizing NASCET criteria, using the distal internal carotid diameter as the denominator. CONTRAST:  10 cc of Gadavist. COMPARISON:  Prior head CT from earlier the same day. FINDINGS: MRI HEAD FINDINGS Brain: Diffuse prominence of the CSF containing spaces compatible generalized age-related cerebral atrophy. Patchy T2/FLAIR hyperintensity involving the periventricular deep  white matter both cerebral hemispheres, most consistent with chronic small vessel ischemic disease, overall mild in nature. Superimposed small remote lacunar infarct present at the posterior left cerebral centrum semi ovale. No abnormal foci of restricted diffusion to suggest acute or subacute ischemia. Gray-white matter differentiation maintained. No encephalomalacia to suggest chronic cortical infarction. No evidence for acute or chronic intracranial hemorrhage. No mass lesion, midline shift or mass effect. No hydrocephalus or extra-axial fluid collection. Pituitary gland and suprasellar region normal. Midline structures intact. Vascular: Major intracranial vascular flow voids are maintained. Skull and upper cervical spine: Craniocervical junction within normal limits. Bone marrow signal intensity normal. No scalp soft tissue abnormality. Sinuses/Orbits: Globes and orbital soft tissues within normal limits. Minimal mucosal thickening noted within the ethmoidal air cells. Paranasal sinuses are otherwise clear. Trace left mastoid effusion noted, of doubtful significance. Inner ear structures grossly normal. Other: None. MRA HEAD FINDINGS Anterior circulation: Visualized distal cervical segments of the internal carotid arteries are patent with antegrade flow. Petrous, cavernous, and supraclinoid segments patent without stenosis or other abnormality. A1 segments patent bilaterally. Left A1 slightly hypoplastic. Normal anterior communicating artery complex. Anterior cerebral arteries patent to their distal aspects without stenosis. Normal in stenosis or occlusion. Normal MCA bifurcations. Distal MCA branches well perfused and symmetric. Posterior circulation: Visualized vertebral arteries patent to the vertebrobasilar junction without stenosis. Right vertebral artery dominant. Right PICA origin patent. Left PICA origin not visualized. Tiny short-segment fenestration noted at the vertebrobasilar junction. Basilar patent  to its distal aspect without stenosis. Superior cerebellar arteries patent bilaterally. Right PCA supplied via the basilar. Left PCA supplied via the basilar as well as a prominent left posterior communicating artery. Both PCAs well perfused or distal aspects without stenosis. Anatomic variants: None significant.  No intracranial aneurysm. MRA NECK FINDINGS Aortic arch: Visualized aortic arch normal in caliber with normal 3 vessel morphology. No hemodynamically significant stenosis seen about the origin of the great vessels. Right carotid system: Right common carotid artery patent from its origin to the bifurcation without stenosis. No significant atheromatous narrowing or irregularity about the right bifurcation. Right ICA patent distally without stenosis, evidence for dissection or occlusion. Left carotid system: Left CCA widely patent proximally. Eccentric plaque within the distal left CCA with associated stenosis of up to 50% by NASCET criteria (series 2, image 92). No significant atheromatous change about the left carotid bulb distally. Left ICA widely patent without stenosis, evidence for dissection or occlusion. Vertebral arteries: Both vertebral arteries arise from the subclavian arteries. No proximal subclavian artery stenosis. Right vertebral artery dominant. Vertebral arteries patent without stenosis, evidence for dissection or occlusion. Other: Patient is status post ACDF at C5-C7. Median sternotomy wires noted. IMPRESSION: MRI HEAD: 1. No acute intracranial abnormality. 2. Age-related cerebral atrophy with mild chronic small vessel ischemic disease. MRA HEAD: Negative intracranial  MRA. No large vessel occlusion, hemodynamically significant stenosis, or other acute vascular abnormality. MRA NECK: 1. Eccentric plaque within the distal left CCA with associated stenosis of up to 40-50% by NASCET criteria. 2. No hemodynamically significant stenosis about the right carotid artery system. 3. Wide patency of  both vertebral arteries within the neck. Right vertebral artery dominant. Electronically Signed   By: Jeannine Boga [name]   On: 08/30/2021 22:01   MR Angiogram Neck W or Wo Contrast  Result Date: 08/30/2021 CLINICAL DATA:  Initial evaluation for neuro deficit, stroke suspected. EXAM: MRI HEAD WITHOUT CONTRAST MRA HEAD WITHOUT CONTRAST MRA NECK WITHOUT AND WITH CONTRAST TECHNIQUE: Multiplanar, multi-echo pulse sequences of the brain and surrounding structures were acquired without intravenous contrast. Angiographic images of the Circle of Willis were acquired using MRA technique without intravenous contrast. Angiographic images of the neck were acquired using MRA technique without and with intravenous contrast. Carotid stenosis measurements (when applicable) are obtained utilizing NASCET criteria, using the distal internal carotid diameter as the denominator. CONTRAST:  10 cc of Gadavist. COMPARISON:  Prior head CT from earlier the same day. FINDINGS: MRI HEAD FINDINGS Brain: Diffuse prominence of the CSF containing spaces compatible generalized age-related cerebral atrophy. Patchy T2/FLAIR hyperintensity involving the periventricular deep white matter both cerebral hemispheres, most consistent with chronic small vessel ischemic disease, overall mild in nature. Superimposed small remote lacunar infarct present at the posterior left cerebral centrum semi ovale. No abnormal foci of restricted diffusion to suggest acute or subacute ischemia. Gray-white matter differentiation maintained. No encephalomalacia to suggest chronic cortical infarction. No evidence for acute or chronic intracranial hemorrhage. No mass lesion, midline shift or mass effect. No hydrocephalus or extra-axial fluid collection. Pituitary gland and suprasellar region normal. Midline structures intact. Vascular: Major intracranial vascular flow voids are maintained. Skull and upper cervical spine: Craniocervical junction within normal limits.  Bone marrow signal intensity normal. No scalp soft tissue abnormality. Sinuses/Orbits: Globes and orbital soft tissues within normal limits. Minimal mucosal thickening noted within the ethmoidal air cells. Paranasal sinuses are otherwise clear. Trace left mastoid effusion noted, of doubtful significance. Inner ear structures grossly normal. Other: None. MRA HEAD FINDINGS Anterior circulation: Visualized distal cervical segments of the internal carotid arteries are patent with antegrade flow. Petrous, cavernous, and supraclinoid segments patent without stenosis or other abnormality. A1 segments patent bilaterally. Left A1 slightly hypoplastic. Normal anterior communicating artery complex. Anterior cerebral arteries patent to their distal aspects without stenosis. Normal in stenosis or occlusion. Normal MCA bifurcations. Distal MCA branches well perfused and symmetric. Posterior circulation: Visualized vertebral arteries patent to the vertebrobasilar junction without stenosis. Right vertebral artery dominant. Right PICA origin patent. Left PICA origin not visualized. Tiny short-segment fenestration noted at the vertebrobasilar junction. Basilar patent to its distal aspect without stenosis. Superior cerebellar arteries patent bilaterally. Right PCA supplied via the basilar. Left PCA supplied via the basilar as well as a prominent left posterior communicating artery. Both PCAs well perfused or distal aspects without stenosis. Anatomic variants: None significant.  No intracranial aneurysm. MRA NECK FINDINGS Aortic arch: Visualized aortic arch normal in caliber with normal 3 vessel morphology. No hemodynamically significant stenosis seen about the origin of the great vessels. Right carotid system: Right common carotid artery patent from its origin to the bifurcation without stenosis. No significant atheromatous narrowing or irregularity about the right bifurcation. Right ICA patent distally without stenosis, evidence for  dissection or occlusion. Left carotid system: Left CCA widely patent proximally. Eccentric plaque within the distal left  CCA with associated stenosis of up to 50% by NASCET criteria (series 2, image 92). No significant atheromatous change about the left carotid bulb distally. Left ICA widely patent without stenosis, evidence for dissection or occlusion. Vertebral arteries: Both vertebral arteries arise from the subclavian arteries. No proximal subclavian artery stenosis. Right vertebral artery dominant. Vertebral arteries patent without stenosis, evidence for dissection or occlusion. Other: Patient is status post ACDF at C5-C7. Median sternotomy wires noted. IMPRESSION: MRI HEAD: 1. No acute intracranial abnormality. 2. Age-related cerebral atrophy with mild chronic small vessel ischemic disease. MRA HEAD: Negative intracranial MRA. No large vessel occlusion, hemodynamically significant stenosis, or other acute vascular abnormality. MRA NECK: 1. Eccentric plaque within the distal left CCA with associated stenosis of up to 40-50% by NASCET criteria. 2. No hemodynamically significant stenosis about the right carotid artery system. 3. Wide patency of both vertebral arteries within the neck. Right vertebral artery dominant. Electronically Signed   By: Jeannine Boga [name]   On: 08/30/2021 22:01   MR BRAIN WO CONTRAST  Result Date: 08/30/2021 CLINICAL DATA:  Initial evaluation for neuro deficit, stroke suspected. EXAM: MRI HEAD WITHOUT CONTRAST MRA HEAD WITHOUT CONTRAST MRA NECK WITHOUT AND WITH CONTRAST TECHNIQUE: Multiplanar, multi-echo pulse sequences of the brain and surrounding structures were acquired without intravenous contrast. Angiographic images of the Circle of Willis were acquired using MRA technique without intravenous contrast. Angiographic images of the neck were acquired using MRA technique without and with intravenous contrast. Carotid stenosis measurements (when applicable) are obtained  utilizing NASCET criteria, using the distal internal carotid diameter as the denominator. CONTRAST:  10 cc of Gadavist. COMPARISON:  Prior head CT from earlier the same day. FINDINGS: MRI HEAD FINDINGS Brain: Diffuse prominence of the CSF containing spaces compatible generalized age-related cerebral atrophy. Patchy T2/FLAIR hyperintensity involving the periventricular deep white matter both cerebral hemispheres, most consistent with chronic small vessel ischemic disease, overall mild in nature. Superimposed small remote lacunar infarct present at the posterior left cerebral centrum semi ovale. No abnormal foci of restricted diffusion to suggest acute or subacute ischemia. Gray-white matter differentiation maintained. No encephalomalacia to suggest chronic cortical infarction. No evidence for acute or chronic intracranial hemorrhage. No mass lesion, midline shift or mass effect. No hydrocephalus or extra-axial fluid collection. Pituitary gland and suprasellar region normal. Midline structures intact. Vascular: Major intracranial vascular flow voids are maintained. Skull and upper cervical spine: Craniocervical junction within normal limits. Bone marrow signal intensity normal. No scalp soft tissue abnormality. Sinuses/Orbits: Globes and orbital soft tissues within normal limits. Minimal mucosal thickening noted within the ethmoidal air cells. Paranasal sinuses are otherwise clear. Trace left mastoid effusion noted, of doubtful significance. Inner ear structures grossly normal. Other: None. MRA HEAD FINDINGS Anterior circulation: Visualized distal cervical segments of the internal carotid arteries are patent with antegrade flow. Petrous, cavernous, and supraclinoid segments patent without stenosis or other abnormality. A1 segments patent bilaterally. Left A1 slightly hypoplastic. Normal anterior communicating artery complex. Anterior cerebral arteries patent to their distal aspects without stenosis. Normal in stenosis  or occlusion. Normal MCA bifurcations. Distal MCA branches well perfused and symmetric. Posterior circulation: Visualized vertebral arteries patent to the vertebrobasilar junction without stenosis. Right vertebral artery dominant. Right PICA origin patent. Left PICA origin not visualized. Tiny short-segment fenestration noted at the vertebrobasilar junction. Basilar patent to its distal aspect without stenosis. Superior cerebellar arteries patent bilaterally. Right PCA supplied via the basilar. Left PCA supplied via the basilar as well as a prominent left posterior communicating artery.  Both PCAs well perfused or distal aspects without stenosis. Anatomic variants: None significant.  No intracranial aneurysm. MRA NECK FINDINGS Aortic arch: Visualized aortic arch normal in caliber with normal 3 vessel morphology. No hemodynamically significant stenosis seen about the origin of the great vessels. Right carotid system: Right common carotid artery patent from its origin to the bifurcation without stenosis. No significant atheromatous narrowing or irregularity about the right bifurcation. Right ICA patent distally without stenosis, evidence for dissection or occlusion. Left carotid system: Left CCA widely patent proximally. Eccentric plaque within the distal left CCA with associated stenosis of up to 50% by NASCET criteria (series 2, image 92). No significant atheromatous change about the left carotid bulb distally. Left ICA widely patent without stenosis, evidence for dissection or occlusion. Vertebral arteries: Both vertebral arteries arise from the subclavian arteries. No proximal subclavian artery stenosis. Right vertebral artery dominant. Vertebral arteries patent without stenosis, evidence for dissection or occlusion. Other: Patient is status post ACDF at C5-C7. Median sternotomy wires noted. IMPRESSION: MRI HEAD: 1. No acute intracranial abnormality. 2. Age-related cerebral atrophy with mild chronic small vessel  ischemic disease. MRA HEAD: Negative intracranial MRA. No large vessel occlusion, hemodynamically significant stenosis, or other acute vascular abnormality. MRA NECK: 1. Eccentric plaque within the distal left CCA with associated stenosis of up to 40-50% by NASCET criteria. 2. No hemodynamically significant stenosis about the right carotid artery system. 3. Wide patency of both vertebral arteries within the neck. Right vertebral artery dominant. Electronically Signed   By: Jeannine Boga [name]   On: 08/30/2021 22:01    MRI's have been done, no acute findings aside form some plaques along carotids.  Patient reassessed, denies any acute changes since arrived here at [facility].  He does use cane at baseline as he awaiting knee surgery.  Have discussed with neuro-- trial of treatment for vertigo, they will assess in ED.  After meclizine, patient feeling much better.  He has been ambulatory in the hallway w/cane which is his baseline.  States he feels significantly better.  Neuro has evaluated, OK to d/c home with meclizine and close follow-up with his neurologist.  Patient comfortable with care plan.  Can return here for new concerns.   Larene Pickett, PA-C 08/31/21 Mary Sella    Noemi Chapel, MD 09/01/21 1250

## 2021-08-30 NOTE — ED Notes (Signed)
Pt ambulatory to restroom with standby assist

## 2021-08-30 NOTE — ED Notes (Signed)
[facility] has just left with him to go to [facility] E.D. I called report to [place], RN,CN at [facility].

## 2021-08-30 NOTE — ED Triage Notes (Signed)
Pt states last night he started to get dizzy with standing, When he sits down or lies he does not have dizziness, Nausea with dizziness. Recent loss of wife 2 weeks ago, thinks it could be stress.

## 2021-08-30 NOTE — ED Provider Notes (Signed)
[facility] EMERGENCY DEPT Provider Note   CSN: [id number] Arrival date & time: 08/30/21  1433     History Chief Complaint  Patient presents with   Dizziness    [name] [name] is a 75 y.o. male.   Dizziness Associated symptoms: nausea   Associated symptoms: no chest pain, no diarrhea, no headaches, no palpitations, no shortness of breath, no vomiting and no weakness   Patient presents for dizziness.  Onset was last night at 9 PM.  At that time, he got out of his recliner and felt a sudden onset of dizziness.  Dizziness was so severe that he had to crawl on the floor.  He reported that it improved with laying down.  When he got up during the night, he had continued dizziness.  This morning, he again experienced it with standing. With the dizziness, he also experienced nausea.  Patient recently suffered the loss of his wife 2 weeks ago.  He states that he has continued to take all of his medications and eat and drink normally.  He has 4 daughters who encouraged him to come to the ED for evaluation of his new symptoms.  Per chart review, most recent echocardiogram was in February of last year.  At that time, he had a 62 to 65% LVEF; mildly increased LVH of basal septum; normal RV function; and no valvular insufficiency or aortic stenosis.  He does have a cardiologist ([name]).  He had quadruple bypass surgery 18 years ago.  Currently, he takes aspirin and Eliquis.  He has no history of CVA.  Currently, at rest, he denies any symptoms.  Throughout and of these episodes, he did not have any chest discomfort or shortness of breath.  He denies any symptoms of focal weakness, numbness, or paresthesias.    Past Medical History:  Diagnosis Date   Acquired atrophy of thyroid 06/05/2013   Overview:  July 2014: controlled on synthroid 40mcg since 2012 May 2015: decreased to Synthroid 50 mcg  Last Assessment & Plan:  Pt doing well with most recent TSH within normal range.  Will  continue current dosage of Synthroid 95mcg. Pt reminded to take medication on empty stomach. Will continue to monitor with periodic laboratory assessment in 3 months.    Anemia    hemoglobin 7.4, iron deficiency, January, 2011, 2 unit transfusion, endoscopy normal, capsule endoscopy February, 2011 no small bowel abnormalities.   Most likely source gastric erosions, followed by GI   Anxiety    Bradycardia    CAD (coronary artery disease)    A. CABG in 2000,status post cardiac cath in 2006, 2009 ....continued chest pain and SOB despite oral medication adjestments including Ranexa. B. Cath November 2009/ mRCA - 2.75 x 23 Abbott Xience V drug-eluting stent ...11/26/2008 to distal  RCA leading to acute marginal.  C. Cath 07/2012 for CP - stable anatomy, med rx. d. cath 2015 and 05/30/2015 stable anatomy, consider Myoview if has CP again   Carotid artery disease ([place]) 08/01/2015   Doppler, May 29, 2015, 1-39% bilateral ICA    Cerebral ischemia    MRI November, 2010, chronic microvascular ischemia   CKD (chronic kidney disease), stage III ([place])    Concussion    Depression    Bipolar   Diabetes mellitus ([place])    Edema    Essential hypertension 06/05/2013   Overview:  July 2014: Controlled with Bystolic Sep 1914: Changed to atenolol.  Last Assessment & Plan:  Will change to atenolol for cost and  follow Improved.  Medication compliance strongly encouraged BP: 116/64 mmHg    Overview:  July 2014: Controlled with Bystolic Sep 1478: Changed to atenolol.  Last Assessment & Plan:  Will change to atenolol for cost and follow   Falling episodes    these have occurred in the past and again recurring 2011   Family history of adverse reaction to anesthesia    "mother died during bypass surgery but not sure if it has to do with anesthesia"   Gastric ulcer    GERD (gastroesophageal reflux disease)    H/O medication noncompliance    Due to loss of insurance   H/O multiple concussions    Hard of hearing     History of blood transfusion 12/20/2013   History of kidney stones    Hx of CABG    2000,  / one median sternotomy suture broken her chest x-ray November, 2010, no clinical significance   Hyperlipidemia    Hypertension    pt. denies   Iron deficiency anemia    Long term (current) use of anticoagulants [Z79.01] 07/20/2016   Low back pain 06/12/2009   Qualifier: Diagnosis of  By: [name]    Nephrolithiasis    Orthostasis    OSA (obstructive sleep apnea)    PAF (paroxysmal atrial fibrillation) ([place])    a. dx 2017, started on amiodarone/warfarin but patient intermittently noncompliant.   PSVT (paroxysmal supraventricular tachycardia) ([place])    RBBB 07/09/2009   Qualifier: Diagnosis of  By: [name], MD, [facility], [name]    RLS (restless legs syndrome) 09/19/2009   Qualifier: Diagnosis of  By: [name]   Overview:  July 2014: Controlled with Mirapex  Last Assessment & Plan:  Patient is doing well. Will continue current management and follow clinically.   Seizure disorder ([place]) 01/09/2017   Spondylosis    C5-6, C6-7 MRI 2010   Syncope 03/2016   TBI (traumatic brain injury) ([place]) 2019   Thyroid disease    Tubulovillous adenoma of colon 2007   Vitamin D deficiency 05/17/2017   Wears glasses     Patient Active Problem List   Diagnosis Date Noted   Sprain of anterior talofibular ligament of left ankle 01/08/2021   Achilles tendon pain 06/06/2020   Thoracic back pain 05/11/2020   CKD (chronic kidney disease), stage IV ([place])    Left ankle pain 12/30/2019   Frequency of urination 05/13/2019   Obesity 02/05/2019   Neck pain 02/05/2019   MCI (mild cognitive impairment) 02/05/2019   Fall at home, initial encounter 11/06/2018   Localized swelling of both lower extremities 07/13/2018   Cervical radiculopathy at C8 02/08/2018   Hypersomnolence 10/18/2017   Vitamin D deficiency 05/17/2017   Seizure disorder ([place]) 01/09/2017   Atrial fibrillation ([place]) [I48.91] 07/20/2016    Long term (current) use of anticoagulants [Z79.01] 07/20/2016   Foraminal stenosis of cervical region 05/31/2016   DOE (dyspnea on exertion) 05/14/2016   Chronic diastolic CHF (congestive heart failure) ([place]) 03/13/2016   Headache 01/25/2016   Carotid artery disease ([place]) 08/01/2015   Spinal stenosis in cervical region 07/30/2015   Occipital neuralgia of right side 07/15/2015   Cervicogenic headache 07/15/2015   Dizziness    Chest pain 05/29/2015   Medicare annual wellness visit, initial 05/11/2015   Sun-damaged skin 05/11/2015   Advance care planning 05/01/2015   Post-traumatic headache 02/18/2015   OA (osteoarthritis) of knee 01/07/2015   Recurrent pain of right knee 05/10/2014   Personal history  of ongoing treatment with alendronate (Fosamax) 12/20/2013   History of blood transfusion 12/20/2013   Hyperlipidemia LDL goal <70 06/27/2013   HLD (hyperlipidemia) 06/27/2013   Essential hypertension 06/05/2013   Arthritis of hand, degenerative 06/05/2013   Acquired atrophy of thyroid 06/05/2013   Clinical depression 06/05/2013   Hypothyroidism due to acquired atrophy of thyroid 06/05/2013   Osteoarthritis of hand 06/05/2013   Arteriosclerosis of coronary artery 05/01/2013   Benign prostatic hyperplasia with urinary obstruction 05/01/2013   BPH with obstruction/lower urinary tract symptoms 05/01/2013   CAD (coronary artery disease) 05/01/2013   Abdominal pain, unspecified site 03/06/2013   Right shoulder pain 10/24/2012   Dermatitis 10/24/2012   Ejection fraction    Diabetes mellitus type II, uncontrolled ([place]) 08/31/2011   Coronary artery disease involving native coronary artery    Anemia    OSA (obstructive sleep apnea)    Edema    Falling episodes    Hx of CABG    Palpitations    Shortness of breath    Insomnia 04/26/2011   RLS (restless legs syndrome) 09/19/2009   Depression with anxiety 07/09/2009   RBBB 07/09/2009   Low back pain potentially associated with  radiculopathy 06/12/2009   SCIATICA 01/17/2009   GERD (gastroesophageal reflux disease) 01/09/2009   TUBULOVILLOUS ADENOMA, COLON, HX OF 01/08/2009   Presence of drug coated stent in right coronary artery 10/29/2008   NEPHROLITHIASIS, HX OF 12/20/2007    Past Surgical History:  Procedure Laterality Date   ANTERIOR CERVICAL DECOMP/DISCECTOMY FUSION N/A 05/31/2016   Procedure: ANTERIOR CERVICAL DECOMPRESSION/DISCECTOMY FUSION CERVICAL FIVE-SIX,CERVICAL SIX-SEVEN;  Surgeon: [name], MD;  Location: [facility];  Service: Neurosurgery;  Laterality: N/A;   CARDIAC CATHETERIZATION N/A 05/30/2015   Procedure: Left Heart Cath and Coronary Angiography;  Surgeon: [name], MD;  Location: [facility];  Service: Cardiovascular;  Laterality: N/A;   COLONOSCOPY     CORONARY ARTERY BYPASS GRAFT     2000   ESOPHAGOGASTRODUODENOSCOPY     LEFT HEART CATH AND CORS/GRAFTS ANGIOGRAPHY N/A 12/15/2017   Procedure: LEFT HEART CATH AND CORS/GRAFTS ANGIOGRAPHY;  Surgeon: [name], MD;  Location: [facility];  Service: Cardiovascular;  Laterality: N/A;   LEFT HEART CATHETERIZATION WITH CORONARY/GRAFT ANGIOGRAM N/A 08/01/2012   Procedure: LEFT HEART CATHETERIZATION WITH Beatrix Fetters;  Surgeon: [name], MD;  Location: [facility] CATH LAB;  Service: Cardiovascular;  Laterality: N/A;   LEFT HEART CATHETERIZATION WITH CORONARY/GRAFT ANGIOGRAM N/A 01/03/2015   Procedure: LEFT HEART CATHETERIZATION WITH Beatrix Fetters;  Surgeon: [name], MD;  Location: [facility] CATH LAB;  Service: Cardiovascular;  Laterality: N/A;   NASAL SEPTUM SURGERY     UP3   PERCUTANEOUS CORONARY STENT INTERVENTION (PCI-S)  10/2008   mRCA PCI  2.75 x 23 Abbott Xience V drug-eluting stent    RIGHT/LEFT HEART CATH AND CORONARY/GRAFT ANGIOGRAPHY N/A 01/07/2020   Procedure: RIGHT/LEFT HEART CATH AND CORONARY/GRAFT ANGIOGRAPHY;  Surgeon: [name], MD;  Location: [facility] LAB;  Service:  Cardiovascular;  Laterality: N/A;   ULTRASOUND GUIDANCE FOR VASCULAR ACCESS  12/15/2017   Procedure: Ultrasound Guidance For Vascular Access;  Surgeon: [name], MD;  Location: [facility] LAB;  Service: Cardiovascular;;       Family History  Problem Relation Age of Onset   Pancreatic cancer Brother    Diabetes Brother    Coronary artery disease Brother    Stroke Brother    Diabetes Brother    Diabetes Mother  Heart failure Mother    Heart failure Father    Hypothyroidism Brother    Coronary artery disease Brother    Other Brother        colon surgery   Heart attack Other        Nephew   Irregular heart beat Daughter    Cancer Maternal Grandmother        unknown    Colon cancer Neg Hx    Stomach cancer Neg Hx    Liver cancer Neg Hx    Rectal cancer Neg Hx    Esophageal cancer Neg Hx     Social History   Tobacco Use   Smoking status: Never   Smokeless tobacco: Never  Vaping Use   Vaping Use: Never used  Substance Use Topics   Alcohol use: No    Alcohol/week: 0.0 standard drinks    Comment: stopped drinking in 1998   Drug use: No    Home Medications Prior to Admission medications   Medication Sig Start Date End Date Taking? Authorizing Provider  meclizine (ANTIVERT) 25 MG tablet Take 1 tablet (25 mg total) by mouth 3 (three) times daily as needed for dizziness. 08/31/21  Yes Larene Pickett, PA-C  albuterol (VENTOLIN HFA) 108 (90 Base) MCG/ACT inhaler Inhale 2 puffs into the lungs every 6 (six) hours as needed for wheezing or shortness of breath. 06/25/19   [name], MD  amLODipine (NORVASC) 5 MG tablet TAKE 1 TABLET BY MOUTH ONCE DAILY 05/01/21 05/01/22  [name], MD  apixaban (ELIQUIS) 5 MG TABS tablet Take 1 tablet (5 mg total) by mouth 2 (two) times daily. 06/10/21   [name], MD  aspirin EC 81 MG tablet Take 81 mg by mouth daily. Swallow whole.    [provider]  atorvastatin (LIPITOR) 80 MG tablet Take 1  tablet (80 mg total) by mouth daily. Patient taking differently: Take 80 mg by mouth at bedtime. 03/09/21   [name], MD  busPIRone (BUSPAR) 7.5 MG tablet Take 1 tablet (7.5 mg total) by mouth 2 (two) times daily. 07/13/21   [name], MD  cetirizine (ZYRTEC) 10 MG tablet TAKE 1 TABLET (10 MG TOTAL) BY MOUTH DAILY. 03/03/21 03/03/22  [name], MD  Continuous Blood Gluc Sensor (FREESTYLE LIBRE 14 DAY SENSOR) MISC USE TO CHECK BLOOD SUGAR AND CHANGE EVERY 14 DAYS 02/25/21 02/25/22  Renato Shin, MD  Diclofenac Sodium (PENNSAID) 2 % SOLN Place 1 application onto the skin 2 (two) times daily. Patient taking differently: Place 1 application onto the skin 2 (two) times daily as needed (pain). 10/17/20   Rosemarie Ax, MD  divalproex (DEPAKOTE) 500 MG DR tablet Take 1 tablet (500 mg total) by mouth 2 (two) times daily. 07/07/21   Kathrynn Ducking, MD  famotidine (PEPCID) 20 MG tablet TAKE 1 TABLET BY MOUTH 2 TIMES DAILY 06/23/21 06/23/22  [name], MD  fenofibrate (TRICOR) 145 MG tablet TAKE 1 TABLET (145 MG TOTAL) BY MOUTH DAILY. 06/10/21 06/10/22  [name], MD  furosemide (LASIX) 20 MG tablet TAKE 1 TABLET BY MOUTH DAILY AS NEEDED FOR EDEMA 01/28/21 01/28/22  [name], MD  glucose blood (ACCU-CHEK AVIVA) test strip Use as directed to check blood sugar 4 times daily.  DX E11.9 02/05/19   [name], MD  hydrocortisone 2.5 % cream Apply on to the skin 2 (two) times daily. 06/24/21 06/24/22  [name], MD  hydrOXYzine (ATARAX/VISTARIL)  10 MG tablet Take 1 tablet (10 mg total) by mouth 3 (three) times daily as needed for anxiety or itching. 06/29/21   [name], MD  insulin glargine, 2 Unit Dial, (TOUJEO MAX SOLOSTAR) 300 UNIT/ML Solostar Pen Inject 130 Units into the skin every morning. 06/16/21   Renato Shin, MD  Insulin Pen Needle 31G X 8 MM MISC use to inject insulin twice a day 04/03/21   Renato Shin, MD  Insulin Syringe-Needle U-100 31G X 5/16" 1 ML MISC USE AS  DIRECTED WITH NOVOLIN 02/24/21 02/24/22  Renato Shin, MD  isosorbide mononitrate (IMDUR) 120 MG 24 hr tablet TAKE 1 TABLET (120 MG TOTAL) BY MOUTH DAILY. 10/23/20 10/23/21  [name], MD  levothyroxine (SYNTHROID) 100 MCG tablet TAKE 1 TABLET (100 MCG TOTAL) BY MOUTH DAILY. 01/13/21 01/13/22  [name], MD  metoprolol succinate (TOPROL-XL) 50 MG 24 hr tablet TAKE 1 AND 1/2 TABLETS EVERY DAY FOR A TOTAL OF 75 MG 11/20/20   [name], MD  nitroGLYCERIN (NITROSTAT) 0.4 MG SL tablet PLACE 1 TABLET UNDER THE TONGUE EVERY 5 MINUTES AS NEEDED FOR CHEST PAIN. 05/20/20   [name], MD  pantoprazole (PROTONIX) 40 MG tablet TAKE 1 TABLET BY MOUTH EVERY DAY 06/30/21   [name], MD  pramipexole (MIRAPEX) 1 MG tablet TAKE 1/2 TO 1 TABLET AT BEDTIME Patient taking differently: Take 1 mg by mouth at bedtime. 04/21/21   [name], MD  ranolazine (RANEXA) 500 MG 12 hr tablet TAKE 1 TABLET BY MOUTH TWICE DAILY 03/02/21 03/02/22  [name], MD  sertraline (ZOLOFT) 100 MG tablet TAKE 1 TABLET BY MOUTH TWICE DAILY 05/01/21 05/01/22  [name], MD  tiZANidine (ZANAFLEX) 4 MG tablet TAKE 1 TABLET BY MOUTH EVERY 6 HOURS AS NEEDED FOR MUSCLE SPASMS 04/29/21 04/29/22  [name], MD  triamcinolone cream (KENALOG) 0.1 % Apply 1 application topically 2 (two) times daily. Use as needed for itchy rash 05/22/21   [name], MD  triamcinolone cream (KENALOG) 0.5 % Apply 1 application on the skin twice a day 07/24/21     Vitamin D, Ergocalciferol, (DRISDOL) 1.25 MG (50000 UNIT) CAPS capsule TAKE 1 CAPSULE BY MOUTH EVERY 7 DAYS 05/18/21 05/18/22  [name], MD    Allergies    Morphine and Shellfish-derived products  Review of Systems   Review of Systems  Constitutional:  Negative for activity change, appetite change, chills, diaphoresis, fatigue and fever.  HENT:  Negative for ear pain and sore throat.   Eyes:  Negative for pain and visual disturbance.  Respiratory:   Negative for cough, chest tightness and shortness of breath.   Cardiovascular:  Negative for chest pain and palpitations.  Gastrointestinal:  Positive for nausea. Negative for abdominal pain, constipation, diarrhea and vomiting.  Genitourinary:  Negative for dysuria, flank pain and hematuria.  Musculoskeletal:  Positive for gait problem. Negative for arthralgias, back pain, myalgias and neck pain.  Skin:  Negative for color change and rash.  Neurological:  Positive for dizziness. Negative for seizures, syncope, facial asymmetry, speech difficulty, weakness, numbness and headaches.  Hematological:  Bruises/bleeds easily (On Eliquis).  Psychiatric/Behavioral:  Negative for confusion and decreased concentration.   All other systems reviewed and are negative.  Physical Exam Updated Vital Signs BP (!) 151/57 (BP Location: Right Arm)   Pulse (!) 54   Temp 97.9 F (36.6 C) (Oral)   Resp 16   Ht 5\' 9"  (1.753 m)  Wt 91.6 kg   SpO2 100%   BMI 29.83 kg/m   Physical Exam Vitals and nursing note reviewed.  Constitutional:      General: He is not in acute distress.    Appearance: Normal appearance. He is well-developed and normal weight. He is not ill-appearing, toxic-appearing or diaphoretic.  HENT:     Head: Normocephalic and atraumatic.     Right Ear: External ear normal.     Left Ear: External ear normal.     Nose: Nose normal.     Mouth/Throat:     Mouth: Mucous membranes are moist.     Pharynx: Oropharynx is clear.  Eyes:     General: No visual field deficit.    Extraocular Movements: Extraocular movements intact.     Conjunctiva/sclera: Conjunctivae normal.  Cardiovascular:     Rate and Rhythm: Normal rate and regular rhythm.     Heart sounds: No murmur heard. Pulmonary:     Effort: Pulmonary effort is normal. No respiratory distress.     Breath sounds: Normal breath sounds. No wheezing or rales.  Chest:     Chest wall: No tenderness.  Abdominal:     Palpations: Abdomen is  soft.     Tenderness: There is no abdominal tenderness.  Musculoskeletal:        General: Normal range of motion.     Cervical back: Normal range of motion and neck supple. No rigidity.     Right lower leg: No edema.     Left lower leg: No edema.  Skin:    General: Skin is warm and dry.     Coloration: Skin is not jaundiced or pale.  Neurological:     General: No focal deficit present.     Mental Status: He is alert and oriented to person, place, and time.     Cranial Nerves: Cranial nerves are intact. No cranial nerve deficit, dysarthria or facial asymmetry.     Sensory: Sensation is intact. No sensory deficit.     Motor: Motor function is intact. No weakness, abnormal muscle tone or pronator drift.     Coordination: Romberg sign positive. Finger-Nose-Finger Test normal.  Psychiatric:        Mood and Affect: Mood normal.        Behavior: Behavior normal.    ED Results / Procedures / Treatments   Labs (all labs ordered are listed, but only abnormal results are displayed) Labs Reviewed  BASIC METABOLIC PANEL - Abnormal; Notable for the following components:      Result Value   Glucose, Bld 229 (*)    Creatinine, Ser 1.56 (*)    GFR, Estimated 47 (*)    All other components within normal limits  CBC - Abnormal; Notable for the following components:   RBC 4.17 (*)    Hemoglobin 12.6 (*)    HCT 36.8 (*)    All other components within normal limits  URINALYSIS, ROUTINE W REFLEX MICROSCOPIC - Abnormal; Notable for the following components:   Glucose, UA >=500 (*)    All other components within normal limits  BRAIN NATRIURETIC PEPTIDE - Abnormal; Notable for the following components:   B Natriuretic Peptide 110.7 (*)    All other components within normal limits  CBG MONITORING, ED - Abnormal; Notable for the following components:   Glucose-Capillary 220 (*)    All other components within normal limits  CBG MONITORING, ED - Abnormal; Notable for the following components:    Glucose-Capillary 55 (*)  All other components within normal limits  CBG MONITORING, ED - Abnormal; Notable for the following components:   Glucose-Capillary 112 (*)    All other components within normal limits  MAGNESIUM  HEPATIC FUNCTION PANEL  URINALYSIS, MICROSCOPIC (REFLEX)  TROPONIN I (HIGH SENSITIVITY)    EKG EKG Interpretation  Date/Time:  Sunday August 30 2021 15:06:12 EDT Ventricular Rate:  59 PR Interval:  222 QRS Duration: 133 QT Interval:  469 QTC Calculation: 465 R Axis:   14 Text Interpretation: Sinus rhythm Prolonged PR interval Right bundle branch block No change from prior ECG Confirmed by Godfrey Pick [phone]) on 08/30/2021 3:09:22 PM  Radiology CT HEAD WO CONTRAST (5MM)  Result Date: 08/30/2021 CLINICAL DATA:  Dizziness EXAM: CT HEAD WITHOUT CONTRAST TECHNIQUE: Contiguous axial images were obtained from the base of the skull through the vertex without intravenous contrast. COMPARISON:  CT 05/08/2021 FINDINGS: Brain: No acute territorial infarction, hemorrhage or intracranial mass. There is atrophy and mild chronic small vessel ischemic change of the white matter. The ventricles are stable size Vascular: No hyperdense vessels.  Carotid vascular calcification Skull: Normal. Negative for fracture or focal lesion. Sinuses/Orbits: No acute finding. Other: None IMPRESSION: 1. No CT evidence for acute intracranial abnormality. 2. Atrophy and chronic small vessel ischemic changes of the white matter. Electronically Signed   By: Donavan Foil [name]   On: 08/30/2021 16:13   MR ANGIO HEAD WO CONTRAST  Result Date: 08/30/2021 CLINICAL DATA:  Initial evaluation for neuro deficit, stroke suspected. EXAM: MRI HEAD WITHOUT CONTRAST MRA HEAD WITHOUT CONTRAST MRA NECK WITHOUT AND WITH CONTRAST TECHNIQUE: Multiplanar, multi-echo pulse sequences of the brain and surrounding structures were acquired without intravenous contrast. Angiographic images of the Circle of Willis were acquired  using MRA technique without intravenous contrast. Angiographic images of the neck were acquired using MRA technique without and with intravenous contrast. Carotid stenosis measurements (when applicable) are obtained utilizing NASCET criteria, using the distal internal carotid diameter as the denominator. CONTRAST:  10 cc of Gadavist. COMPARISON:  Prior head CT from earlier the same day. FINDINGS: MRI HEAD FINDINGS Brain: Diffuse prominence of the CSF containing spaces compatible generalized age-related cerebral atrophy. Patchy T2/FLAIR hyperintensity involving the periventricular deep white matter both cerebral hemispheres, most consistent with chronic small vessel ischemic disease, overall mild in nature. Superimposed small remote lacunar infarct present at the posterior left cerebral centrum semi ovale. No abnormal foci of restricted diffusion to suggest acute or subacute ischemia. Gray-white matter differentiation maintained. No encephalomalacia to suggest chronic cortical infarction. No evidence for acute or chronic intracranial hemorrhage. No mass lesion, midline shift or mass effect. No hydrocephalus or extra-axial fluid collection. Pituitary gland and suprasellar region normal. Midline structures intact. Vascular: Major intracranial vascular flow voids are maintained. Skull and upper cervical spine: Craniocervical junction within normal limits. Bone marrow signal intensity normal. No scalp soft tissue abnormality. Sinuses/Orbits: Globes and orbital soft tissues within normal limits. Minimal mucosal thickening noted within the ethmoidal air cells. Paranasal sinuses are otherwise clear. Trace left mastoid effusion noted, of doubtful significance. Inner ear structures grossly normal. Other: None. MRA HEAD FINDINGS Anterior circulation: Visualized distal cervical segments of the internal carotid arteries are patent with antegrade flow. Petrous, cavernous, and supraclinoid segments patent without stenosis or other  abnormality. A1 segments patent bilaterally. Left A1 slightly hypoplastic. Normal anterior communicating artery complex. Anterior cerebral arteries patent to their distal aspects without stenosis. Normal in stenosis or occlusion. Normal MCA bifurcations. Distal MCA branches well perfused and symmetric. Posterior  circulation: Visualized vertebral arteries patent to the vertebrobasilar junction without stenosis. Right vertebral artery dominant. Right PICA origin patent. Left PICA origin not visualized. Tiny short-segment fenestration noted at the vertebrobasilar junction. Basilar patent to its distal aspect without stenosis. Superior cerebellar arteries patent bilaterally. Right PCA supplied via the basilar. Left PCA supplied via the basilar as well as a prominent left posterior communicating artery. Both PCAs well perfused or distal aspects without stenosis. Anatomic variants: None significant.  No intracranial aneurysm. MRA NECK FINDINGS Aortic arch: Visualized aortic arch normal in caliber with normal 3 vessel morphology. No hemodynamically significant stenosis seen about the origin of the great vessels. Right carotid system: Right common carotid artery patent from its origin to the bifurcation without stenosis. No significant atheromatous narrowing or irregularity about the right bifurcation. Right ICA patent distally without stenosis, evidence for dissection or occlusion. Left carotid system: Left CCA widely patent proximally. Eccentric plaque within the distal left CCA with associated stenosis of up to 50% by NASCET criteria (series 2, image 92). No significant atheromatous change about the left carotid bulb distally. Left ICA widely patent without stenosis, evidence for dissection or occlusion. Vertebral arteries: Both vertebral arteries arise from the subclavian arteries. No proximal subclavian artery stenosis. Right vertebral artery dominant. Vertebral arteries patent without stenosis, evidence for dissection  or occlusion. Other: Patient is status post ACDF at C5-C7. Median sternotomy wires noted. IMPRESSION: MRI HEAD: 1. No acute intracranial abnormality. 2. Age-related cerebral atrophy with mild chronic small vessel ischemic disease. MRA HEAD: Negative intracranial MRA. No large vessel occlusion, hemodynamically significant stenosis, or other acute vascular abnormality. MRA NECK: 1. Eccentric plaque within the distal left CCA with associated stenosis of up to 40-50% by NASCET criteria. 2. No hemodynamically significant stenosis about the right carotid artery system. 3. Wide patency of both vertebral arteries within the neck. Right vertebral artery dominant. Electronically Signed   By: Jeannine Boga [name]   On: 08/30/2021 22:01   MR Angiogram Neck W or Wo Contrast  Result Date: 08/30/2021 CLINICAL DATA:  Initial evaluation for neuro deficit, stroke suspected. EXAM: MRI HEAD WITHOUT CONTRAST MRA HEAD WITHOUT CONTRAST MRA NECK WITHOUT AND WITH CONTRAST TECHNIQUE: Multiplanar, multi-echo pulse sequences of the brain and surrounding structures were acquired without intravenous contrast. Angiographic images of the Circle of Willis were acquired using MRA technique without intravenous contrast. Angiographic images of the neck were acquired using MRA technique without and with intravenous contrast. Carotid stenosis measurements (when applicable) are obtained utilizing NASCET criteria, using the distal internal carotid diameter as the denominator. CONTRAST:  10 cc of Gadavist. COMPARISON:  Prior head CT from earlier the same day. FINDINGS: MRI HEAD FINDINGS Brain: Diffuse prominence of the CSF containing spaces compatible generalized age-related cerebral atrophy. Patchy T2/FLAIR hyperintensity involving the periventricular deep white matter both cerebral hemispheres, most consistent with chronic small vessel ischemic disease, overall mild in nature. Superimposed small remote lacunar infarct present at the posterior  left cerebral centrum semi ovale. No abnormal foci of restricted diffusion to suggest acute or subacute ischemia. Gray-white matter differentiation maintained. No encephalomalacia to suggest chronic cortical infarction. No evidence for acute or chronic intracranial hemorrhage. No mass lesion, midline shift or mass effect. No hydrocephalus or extra-axial fluid collection. Pituitary gland and suprasellar region normal. Midline structures intact. Vascular: Major intracranial vascular flow voids are maintained. Skull and upper cervical spine: Craniocervical junction within normal limits. Bone marrow signal intensity normal. No scalp soft tissue abnormality. Sinuses/Orbits: Globes and orbital soft tissues within normal  limits. Minimal mucosal thickening noted within the ethmoidal air cells. Paranasal sinuses are otherwise clear. Trace left mastoid effusion noted, of doubtful significance. Inner ear structures grossly normal. Other: None. MRA HEAD FINDINGS Anterior circulation: Visualized distal cervical segments of the internal carotid arteries are patent with antegrade flow. Petrous, cavernous, and supraclinoid segments patent without stenosis or other abnormality. A1 segments patent bilaterally. Left A1 slightly hypoplastic. Normal anterior communicating artery complex. Anterior cerebral arteries patent to their distal aspects without stenosis. Normal in stenosis or occlusion. Normal MCA bifurcations. Distal MCA branches well perfused and symmetric. Posterior circulation: Visualized vertebral arteries patent to the vertebrobasilar junction without stenosis. Right vertebral artery dominant. Right PICA origin patent. Left PICA origin not visualized. Tiny short-segment fenestration noted at the vertebrobasilar junction. Basilar patent to its distal aspect without stenosis. Superior cerebellar arteries patent bilaterally. Right PCA supplied via the basilar. Left PCA supplied via the basilar as well as a prominent left  posterior communicating artery. Both PCAs well perfused or distal aspects without stenosis. Anatomic variants: None significant.  No intracranial aneurysm. MRA NECK FINDINGS Aortic arch: Visualized aortic arch normal in caliber with normal 3 vessel morphology. No hemodynamically significant stenosis seen about the origin of the great vessels. Right carotid system: Right common carotid artery patent from its origin to the bifurcation without stenosis. No significant atheromatous narrowing or irregularity about the right bifurcation. Right ICA patent distally without stenosis, evidence for dissection or occlusion. Left carotid system: Left CCA widely patent proximally. Eccentric plaque within the distal left CCA with associated stenosis of up to 50% by NASCET criteria (series 2, image 92). No significant atheromatous change about the left carotid bulb distally. Left ICA widely patent without stenosis, evidence for dissection or occlusion. Vertebral arteries: Both vertebral arteries arise from the subclavian arteries. No proximal subclavian artery stenosis. Right vertebral artery dominant. Vertebral arteries patent without stenosis, evidence for dissection or occlusion. Other: Patient is status post ACDF at C5-C7. Median sternotomy wires noted. IMPRESSION: MRI HEAD: 1. No acute intracranial abnormality. 2. Age-related cerebral atrophy with mild chronic small vessel ischemic disease. MRA HEAD: Negative intracranial MRA. No large vessel occlusion, hemodynamically significant stenosis, or other acute vascular abnormality. MRA NECK: 1. Eccentric plaque within the distal left CCA with associated stenosis of up to 40-50% by NASCET criteria. 2. No hemodynamically significant stenosis about the right carotid artery system. 3. Wide patency of both vertebral arteries within the neck. Right vertebral artery dominant. Electronically Signed   By: Jeannine Boga [name]   On: 08/30/2021 22:01   MR BRAIN WO CONTRAST  Result  Date: 08/30/2021 CLINICAL DATA:  Initial evaluation for neuro deficit, stroke suspected. EXAM: MRI HEAD WITHOUT CONTRAST MRA HEAD WITHOUT CONTRAST MRA NECK WITHOUT AND WITH CONTRAST TECHNIQUE: Multiplanar, multi-echo pulse sequences of the brain and surrounding structures were acquired without intravenous contrast. Angiographic images of the Circle of Willis were acquired using MRA technique without intravenous contrast. Angiographic images of the neck were acquired using MRA technique without and with intravenous contrast. Carotid stenosis measurements (when applicable) are obtained utilizing NASCET criteria, using the distal internal carotid diameter as the denominator. CONTRAST:  10 cc of Gadavist. COMPARISON:  Prior head CT from earlier the same day. FINDINGS: MRI HEAD FINDINGS Brain: Diffuse prominence of the CSF containing spaces compatible generalized age-related cerebral atrophy. Patchy T2/FLAIR hyperintensity involving the periventricular deep white matter both cerebral hemispheres, most consistent with chronic small vessel ischemic disease, overall mild in nature. Superimposed small remote lacunar infarct present at the  posterior left cerebral centrum semi ovale. No abnormal foci of restricted diffusion to suggest acute or subacute ischemia. Gray-white matter differentiation maintained. No encephalomalacia to suggest chronic cortical infarction. No evidence for acute or chronic intracranial hemorrhage. No mass lesion, midline shift or mass effect. No hydrocephalus or extra-axial fluid collection. Pituitary gland and suprasellar region normal. Midline structures intact. Vascular: Major intracranial vascular flow voids are maintained. Skull and upper cervical spine: Craniocervical junction within normal limits. Bone marrow signal intensity normal. No scalp soft tissue abnormality. Sinuses/Orbits: Globes and orbital soft tissues within normal limits. Minimal mucosal thickening noted within the ethmoidal air  cells. Paranasal sinuses are otherwise clear. Trace left mastoid effusion noted, of doubtful significance. Inner ear structures grossly normal. Other: None. MRA HEAD FINDINGS Anterior circulation: Visualized distal cervical segments of the internal carotid arteries are patent with antegrade flow. Petrous, cavernous, and supraclinoid segments patent without stenosis or other abnormality. A1 segments patent bilaterally. Left A1 slightly hypoplastic. Normal anterior communicating artery complex. Anterior cerebral arteries patent to their distal aspects without stenosis. Normal in stenosis or occlusion. Normal MCA bifurcations. Distal MCA branches well perfused and symmetric. Posterior circulation: Visualized vertebral arteries patent to the vertebrobasilar junction without stenosis. Right vertebral artery dominant. Right PICA origin patent. Left PICA origin not visualized. Tiny short-segment fenestration noted at the vertebrobasilar junction. Basilar patent to its distal aspect without stenosis. Superior cerebellar arteries patent bilaterally. Right PCA supplied via the basilar. Left PCA supplied via the basilar as well as a prominent left posterior communicating artery. Both PCAs well perfused or distal aspects without stenosis. Anatomic variants: None significant.  No intracranial aneurysm. MRA NECK FINDINGS Aortic arch: Visualized aortic arch normal in caliber with normal 3 vessel morphology. No hemodynamically significant stenosis seen about the origin of the great vessels. Right carotid system: Right common carotid artery patent from its origin to the bifurcation without stenosis. No significant atheromatous narrowing or irregularity about the right bifurcation. Right ICA patent distally without stenosis, evidence for dissection or occlusion. Left carotid system: Left CCA widely patent proximally. Eccentric plaque within the distal left CCA with associated stenosis of up to 50% by NASCET criteria (series 2, image  92). No significant atheromatous change about the left carotid bulb distally. Left ICA widely patent without stenosis, evidence for dissection or occlusion. Vertebral arteries: Both vertebral arteries arise from the subclavian arteries. No proximal subclavian artery stenosis. Right vertebral artery dominant. Vertebral arteries patent without stenosis, evidence for dissection or occlusion. Other: Patient is status post ACDF at C5-C7. Median sternotomy wires noted. IMPRESSION: MRI HEAD: 1. No acute intracranial abnormality. 2. Age-related cerebral atrophy with mild chronic small vessel ischemic disease. MRA HEAD: Negative intracranial MRA. No large vessel occlusion, hemodynamically significant stenosis, or other acute vascular abnormality. MRA NECK: 1. Eccentric plaque within the distal left CCA with associated stenosis of up to 40-50% by NASCET criteria. 2. No hemodynamically significant stenosis about the right carotid artery system. 3. Wide patency of both vertebral arteries within the neck. Right vertebral artery dominant. Electronically Signed   By: Jeannine Boga [name]   On: 08/30/2021 22:01    Procedures Procedures   Medications Ordered in ED Medications  lactated ringers bolus 500 mL (0 mLs Intravenous Stopped 08/30/21 1816)  gadobutrol (GADAVIST) 1 MMOL/ML injection 10 mL (10 mLs Intravenous Contrast Given 08/30/21 2131)  gadobutrol (GADAVIST) 1 MMOL/ML injection 10 mL (10 mLs Intravenous Contrast Given 08/30/21 2201)  meclizine (ANTIVERT) tablet 25 mg (25 mg Oral Given 08/30/21 2321)  ED Course  I have reviewed the triage vital signs and the nursing notes.  Pertinent labs & imaging results that were available during my care of the patient were reviewed by me and considered in my medical decision making (see chart for details).    MDM Rules/Calculators/A&P                           Patient presents for acute onset of dizziness while standing.  This first occurred last night at 9 PM.   It continues today.  Vital signs normal upon arrival.  On exam, patient is resting in reclined position, comfortable, without any physical complaints at this time.  Patient has no focal neurologic deficits on exam.  When standing, he is off balance.  Romberg positive without listing to one side in particular.   Hints exam not able to identify a peripheral cause, i.e. no corrective saccade. No significant nystagmus identified. No skew/dysconjugate gaze.  Orthostatic vital signs were checked.  Patient did not have any drop in blood pressure with sitting or standing.  EKG showed normal sinus rhythm with baseline RBBB.  Laboratory work-up was initiated.  Bolus of IV fluids ordered.  CT of head ordered. Initial workup unable to identify any metabolic or cardiac causes of his symptoms.  Following IV fluids, patient was reassessed.  He continued to have severe imbalance with standing or walking.  At this point, he also endorsed a headache that has been present intermittently over the past 3 days.  Patient reports a remote history of concussive injuries which appear unrelated to his current symptoms.  He states that prior to several days ago, he never had headaches ever.  He has never had dizziness before.  He has chronic hearing loss.  He denies any changes in his hearing, tinnitus, or aural fullness.  I spoke with patient about further testing with MRI to identify possible central etiology of his current symptoms.  Patient was agreeable to transfer to [facility] for MRI study.  On further chart review, patient has been seen by [facility] neurology, as recently as 7 weeks ago.  At that time, he was being evaluated for memory changes.  Per clinic note, he does have a history of headaches.  He also has a seizure history but has been off of AED medications.    At this point, he was accompanied by his daughter at bedside.  Patient's daughter spoke with me privately and stated that the patient has had chronic progressive  memory loss.  Since his wife died, he has had worsening issues with his memory.  This likely explains why the patient did not mention any headache pain initially.  Patient's daughter also mentioned that the patient has seemed to be having hallucinations since the passing of his wife.  These have been intermittent over the past 2 weeks.  The dizziness, however, is completely new.    Patient was transported to [facility] ED in stable condition.  Final Clinical Impression(s) / ED Diagnoses Final diagnoses:  Dizziness    Rx / DC Orders ED Discharge Orders          Ordered    meclizine (ANTIVERT) 25 MG tablet  3 times daily PRN        08/31/21 0043             Godfrey Pick, MD 08/31/21 [phone]

## 2021-08-30 NOTE — ED Notes (Signed)
The pt arrived by [facility] from [facility]  from there to the triage area.  He was ent here for a mri  he is alert and oriented and he does not have any problems with tight places rt sided headache and dizziness he cannot stand without the dizziness.  He has no dizziness while sitting down  none at present in the wheelchair

## 2021-08-30 NOTE — ED Provider Notes (Signed)
Emergency Medicine Provider Triage Evaluation Note  [name] [name] , a 75 y.o. male  was evaluated in triage.  Pt complains of dizziness.  Presents from [facility] as a transfer, was worked up for same there which was unremarkable.  Sent here for an MRI.  Patient states that he is dizzy when he stands up, and this started acutely this morning.  Had an associated fall with same, no loss of consciousness, did not hit his head.  Of note, patient is on Eliquis has not missed any doses.  Does state that his wife passed away 2 weeks ago and he is felt "off" ever since.  Review of Systems  Positive: Dizziness, nausea Negative: Chest pain, diarrhea, headaches, palpitations, shortness of breath, vomiting, weakness  Physical Exam  BP (!) 165/73 (BP Location: Left Arm)   Pulse (!) 51   Temp 97.9 F (36.6 C) (Oral)   Resp 16   Ht 5\' 9"  (1.753 m)   Wt 91.6 kg   SpO2 100%   BMI 29.83 kg/m  Gen:   Awake, no distress   Resp:  Normal effort  MSK:   Moves extremities without difficulty    Medical Decision Making  Medically screening exam initiated at 7:35 PM.  Appropriate orders placed.  Cinda Quest Batie was informed that the remainder of the evaluation will be completed by another provider, this initial triage assessment does not replace that evaluation, and the importance of remaining in the ED until their evaluation is complete.     Nestor Lewandowsky 08/30/21 1939    Fredia Sorrow, MD 09/07/21 1320

## 2021-08-30 NOTE — ED Notes (Signed)
Pt ok to have heart healthy diet order placed per Quincy Carnes, PA

## 2021-08-31 ENCOUNTER — Other Ambulatory Visit (HOSPITAL_BASED_OUTPATIENT_CLINIC_OR_DEPARTMENT_OTHER): Payer: Self-pay

## 2021-08-31 ENCOUNTER — Other Ambulatory Visit: Payer: Self-pay

## 2021-08-31 DIAGNOSIS — R42 Dizziness and giddiness: Secondary | ICD-10-CM

## 2021-08-31 LAB — CBG MONITORING, ED: Glucose-Capillary: 112 mg/dL — ABNORMAL HIGH (ref 70–99)

## 2021-08-31 MED ORDER — MECLIZINE HCL 25 MG PO TABS
25.0000 mg | ORAL_TABLET | Freq: Three times a day (TID) | ORAL | 0 refills | Status: DC | PRN
  Filled 2021-08-31: qty 30, 10d supply, fill #0

## 2021-08-31 NOTE — Discharge Instructions (Signed)
Can continue taking meclizine as needed for dizziness. Follow-up neurology. Return here for new concerns.

## 2021-08-31 NOTE — Consult Note (Signed)
Neurology Consult H&P  [name] [name] MR# [id number] 08/31/2021   CC: Dizziness  History is obtained from: Patient, chart  HPI: [name] [name] is a 75 y.o. male past medical history as reviewed below with acute onset dizziness transferred from OSH for MRI.  He stated he was in bed trying to get up and felt as if his environment was moving.  He laid back down and closes eyes and the dizziness subsided.  He rested a while and tried to get up again and had another attack very similar in character.  He noted the attacks were provoked with looking up and standing up and subsided with lying down closing his eyes.  He had mild nausea without any emesis.  There is no associated visual disturbance or hearing disturbance.   The following information was taken from ED note 08/30/21  1433:  "Patient presents for dizziness.  Onset was last night at 9 PM.  At that time, he got out of his recliner and felt a sudden onset of dizziness.  Dizziness was so severe that he had to crawl on the floor.  He reported that it improved with laying down.  When he got up during the night, he had continued dizziness.  This morning, he again experienced it with standing. With the dizziness, he also experienced nausea.  Patient recently suffered the loss of his wife 2 weeks ago.  He states that he has continued to take all of his medications and eat and drink normally.  He has 4 daughters who encouraged him to come to the ED for evaluation of his new symptoms.  Per chart review, most recent echocardiogram was in February of last year.  At that time, he had a 53 to 65% LVEF; mildly increased LVH of basal septum; normal RV function; and no valvular insufficiency or aortic stenosis.  He does have a cardiologist ([name]).  He had quadruple bypass surgery 18 years ago.  Currently, he takes aspirin and Eliquis.  He has no history of CVA.  Currently, at rest, he denies any symptoms.  Throughout and of these episodes, he did not  have any chest discomfort or shortness of breath.  He denies any symptoms of focal weakness, numbness, or paresthesias."   He does have chronic hearing loss.  LKW: Unclear tNK given: No OS W IR Thrombectomy No,  Modified Rankin Scale: 0-Completely asymptomatic and back to baseline post- stroke NIHSS: 0  ROS: A complete ROS was performed and is negative except as noted in the HPI.   Past Medical History:  Diagnosis Date   Acquired atrophy of thyroid 06/05/2013   Overview:  July 2014: controlled on synthroid 23mcg since 2012 May 2015: decreased to Synthroid 50 mcg  Last Assessment & Plan:  Pt doing well with most recent TSH within normal range.  Will continue current dosage of Synthroid 2mcg. Pt reminded to take medication on empty stomach. Will continue to monitor with periodic laboratory assessment in 3 months.    Anemia    hemoglobin 7.4, iron deficiency, January, 2011, 2 unit transfusion, endoscopy normal, capsule endoscopy February, 2011 no small bowel abnormalities.   Most likely source gastric erosions, followed by GI   Anxiety    Bradycardia    CAD (coronary artery disease)    A. CABG in 2000,status post cardiac cath in 2006, 2009 ....continued chest pain and SOB despite oral medication adjestments including Ranexa. B. Cath November 2009/ mRCA - 2.75 x 23 Abbott Xience V drug-eluting stent ...11/26/2008  to distal  RCA leading to acute marginal.  C. Cath 07/2012 for CP - stable anatomy, med rx. d. cath 2015 and 05/30/2015 stable anatomy, consider Myoview if has CP again   Carotid artery disease ([place]) 08/01/2015   Doppler, May 29, 2015, 1-39% bilateral ICA    Cerebral ischemia    MRI November, 2010, chronic microvascular ischemia   CKD (chronic kidney disease), stage III ([name])    Concussion    Depression    Bipolar   Diabetes mellitus ([place])    Edema    Essential hypertension 06/05/2013   Overview:  July 2014: Controlled with Bystolic Sep 5732: Changed to atenolol.  Last  Assessment & Plan:  Will change to atenolol for cost and follow Improved.  Medication compliance strongly encouraged BP: 116/64 mmHg    Overview:  July 2014: Controlled with Bystolic Sep 2025: Changed to atenolol.  Last Assessment & Plan:  Will change to atenolol for cost and follow   Falling episodes    these have occurred in the past and again recurring 2011   Family history of adverse reaction to anesthesia    "mother died during bypass surgery but not sure if it has to do with anesthesia"   Gastric ulcer    GERD (gastroesophageal reflux disease)    H/O medication noncompliance    Due to loss of insurance   H/O multiple concussions    Hard of hearing    History of blood transfusion 12/20/2013   History of kidney stones    Hx of CABG    2000,  / one median sternotomy suture broken her chest x-ray November, 2010, no clinical significance   Hyperlipidemia    Hypertension    pt. denies   Iron deficiency anemia    Long term (current) use of anticoagulants [Z79.01] 07/20/2016   Low back pain 06/12/2009   Qualifier: Diagnosis of  By: [name]    Nephrolithiasis    Orthostasis    OSA (obstructive sleep apnea)    PAF (paroxysmal atrial fibrillation) ([place])    a. dx 2017, started on amiodarone/warfarin but patient intermittently noncompliant.   PSVT (paroxysmal supraventricular tachycardia) ([place])    RBBB 07/09/2009   Qualifier: Diagnosis of  By: [name], MD, [facility], [name]    RLS (restless legs syndrome) 09/19/2009   Qualifier: Diagnosis of  By: [name]   Overview:  July 2014: Controlled with Mirapex  Last Assessment & Plan:  Patient is doing well. Will continue current management and follow clinically.   Seizure disorder ([place]) 01/09/2017   Spondylosis    C5-6, C6-7 MRI 2010   Syncope 03/2016   TBI (traumatic brain injury) ([place]) 2019   Thyroid disease    Tubulovillous adenoma of colon 2007   Vitamin D deficiency 05/17/2017   Wears glasses      Family History   Problem Relation Age of Onset   Pancreatic cancer Brother    Diabetes Brother    Coronary artery disease Brother    Stroke Brother    Diabetes Brother    Diabetes Mother    Heart failure Mother    Heart failure Father    Hypothyroidism Brother    Coronary artery disease Brother    Other Brother        colon surgery   Heart attack Other        Nephew   Irregular heart beat Daughter    Cancer Maternal Grandmother  unknown    Colon cancer Neg Hx    Stomach cancer Neg Hx    Liver cancer Neg Hx    Rectal cancer Neg Hx    Esophageal cancer Neg Hx     Social History:  reports that he has never smoked. He has never used smokeless tobacco. He reports that he does not drink alcohol and does not use drugs.   Prior to Admission medications   Medication Sig Start Date End Date Taking? Authorizing Provider  meclizine (ANTIVERT) 25 MG tablet Take 1 tablet (25 mg total) by mouth 3 (three) times daily as needed for dizziness. 08/31/21  Yes Larene Pickett, PA-C  albuterol (VENTOLIN HFA) 108 (90 Base) MCG/ACT inhaler Inhale 2 puffs into the lungs every 6 (six) hours as needed for wheezing or shortness of breath. 06/25/19   [name], MD  amLODipine (NORVASC) 5 MG tablet TAKE 1 TABLET BY MOUTH ONCE DAILY 05/01/21 05/01/22  [name], MD  apixaban (ELIQUIS) 5 MG TABS tablet Take 1 tablet (5 mg total) by mouth 2 (two) times daily. 06/10/21   [name], MD  aspirin EC 81 MG tablet Take 81 mg by mouth daily. Swallow whole.    [provider]  atorvastatin (LIPITOR) 80 MG tablet Take 1 tablet (80 mg total) by mouth daily. Patient taking differently: Take 80 mg by mouth at bedtime. 03/09/21   [name], MD  busPIRone (BUSPAR) 7.5 MG tablet Take 1 tablet (7.5 mg total) by mouth 2 (two) times daily. 07/13/21   [name], MD  cetirizine (ZYRTEC) 10 MG tablet TAKE 1 TABLET (10 MG TOTAL) BY MOUTH DAILY. 03/03/21 03/03/22  [name], MD  Continuous Blood  Gluc Sensor (FREESTYLE LIBRE 14 DAY SENSOR) MISC USE TO CHECK BLOOD SUGAR AND CHANGE EVERY 14 DAYS 02/25/21 02/25/22  Renato Shin, MD  Diclofenac Sodium (PENNSAID) 2 % SOLN Place 1 application onto the skin 2 (two) times daily. Patient taking differently: Place 1 application onto the skin 2 (two) times daily as needed (pain). 10/17/20   Rosemarie Ax, MD  divalproex (DEPAKOTE) 500 MG DR tablet Take 1 tablet (500 mg total) by mouth 2 (two) times daily. 07/07/21   Kathrynn Ducking, MD  famotidine (PEPCID) 20 MG tablet TAKE 1 TABLET BY MOUTH 2 TIMES DAILY 06/23/21 06/23/22  [name], MD  fenofibrate (TRICOR) 145 MG tablet TAKE 1 TABLET (145 MG TOTAL) BY MOUTH DAILY. 06/10/21 06/10/22  [name], MD  furosemide (LASIX) 20 MG tablet TAKE 1 TABLET BY MOUTH DAILY AS NEEDED FOR EDEMA 01/28/21 01/28/22  [name], MD  glucose blood (ACCU-CHEK AVIVA) test strip Use as directed to check blood sugar 4 times daily.  DX E11.9 02/05/19   [name], MD  hydrocortisone 2.5 % cream Apply on to the skin 2 (two) times daily. 06/24/21 06/24/22  [name], MD  hydrOXYzine (ATARAX/VISTARIL) 10 MG tablet Take 1 tablet (10 mg total) by mouth 3 (three) times daily as needed for anxiety or itching. 06/29/21   [name], MD  insulin glargine, 2 Unit Dial, (TOUJEO MAX SOLOSTAR) 300 UNIT/ML Solostar Pen Inject 130 Units into the skin every morning. 06/16/21   Renato Shin, MD  Insulin Pen Needle 31G X 8 MM MISC use to inject insulin twice a day 04/03/21   Renato Shin, MD  Insulin Syringe-Needle U-100 31G X 5/16" 1 ML MISC USE AS DIRECTED WITH NOVOLIN 02/24/21 02/24/22  Loanne Drilling,  Hilliard Clark, MD  isosorbide mononitrate (IMDUR) 120 MG 24 hr tablet TAKE 1 TABLET (120 MG TOTAL) BY MOUTH DAILY. 10/23/20 10/23/21  [name], MD  levothyroxine (SYNTHROID) 100 MCG tablet TAKE 1 TABLET (100 MCG TOTAL) BY MOUTH DAILY. 01/13/21 01/13/22  [name], MD  metoprolol succinate (TOPROL-XL) 50 MG 24 hr tablet TAKE 1  AND 1/2 TABLETS EVERY DAY FOR A TOTAL OF 75 MG 11/20/20   [name], MD  nitroGLYCERIN (NITROSTAT) 0.4 MG SL tablet PLACE 1 TABLET UNDER THE TONGUE EVERY 5 MINUTES AS NEEDED FOR CHEST PAIN. 05/20/20   [name], MD  pantoprazole (PROTONIX) 40 MG tablet TAKE 1 TABLET BY MOUTH EVERY DAY 06/30/21   [name], MD  pramipexole (MIRAPEX) 1 MG tablet TAKE 1/2 TO 1 TABLET AT BEDTIME Patient taking differently: Take 1 mg by mouth at bedtime. 04/21/21   [name], MD  ranolazine (RANEXA) 500 MG 12 hr tablet TAKE 1 TABLET BY MOUTH TWICE DAILY 03/02/21 03/02/22  [name], MD  sertraline (ZOLOFT) 100 MG tablet TAKE 1 TABLET BY MOUTH TWICE DAILY 05/01/21 05/01/22  [name], MD  tiZANidine (ZANAFLEX) 4 MG tablet TAKE 1 TABLET BY MOUTH EVERY 6 HOURS AS NEEDED FOR MUSCLE SPASMS 04/29/21 04/29/22  [name], MD  triamcinolone cream (KENALOG) 0.1 % Apply 1 application topically 2 (two) times daily. Use as needed for itchy rash 05/22/21   [name], MD  triamcinolone cream (KENALOG) 0.5 % Apply 1 application on the skin twice a day 07/24/21     Vitamin D, Ergocalciferol, (DRISDOL) 1.25 MG (50000 UNIT) CAPS capsule TAKE 1 CAPSULE BY MOUTH EVERY 7 DAYS 05/18/21 05/18/22  [name], MD    Exam: Current vital signs: BP (!) 151/57 (BP Location: Right Arm)   Pulse (!) 54   Temp 97.9 F (36.6 C) (Oral)   Resp 16   Ht 5\' 9"  (1.753 m)   Wt 91.6 kg   SpO2 100%   BMI 29.83 kg/m   Physical Exam  Constitutional: Appears well-developed and well-nourished.  Psych: Affect appropriate to situation Eyes: No scleral injection HENT: No OP obstruction. Head: Normocephalic.  Cardiovascular: Normal rate and regular rhythm.  Respiratory: Effort normal, symmetric excursions bilaterally, no audible wheezing. GI: Soft.  No distension. There is no tenderness.  Skin: WDI  Neuro: Mental Status: Patient is awake, alert, oriented to person, place, month, year, and  situation. Patient is able to give a clear and coherent history. Speech  fluent, intact comprehension and repetition. No signs of aphasia or neglect. Visual Fields are full. Pupils are equal, round, and reactive to light. EOMI without ptosis or diploplia.  Facial sensation is symmetric to temperature Facial movement is symmetric.  Hearing is intact to voice. Uvula midline and palate elevates symmetrically. Shoulder shrug is symmetric. Tongue is midline without atrophy or fasciculations.  Tone is normal. Bulk is normal. 5/5 strength was present in all four extremities. Sensation is symmetric to light touch and temperature in the arms and legs. Deep Tendon Reflexes: 2+ and symmetric in the biceps and patellae. Toes are downgoing bilaterally. FNF and HKS are intact bilaterally. Gait - Deferred   I have reviewed the images obtained: MRI brain and MRA head and neck showed No acute intracranial abnormality. Age-related cerebral atrophy with mild chronic small vessel ischemic disease. Head and neck: Negative intracranial MRA. No large vessel occlusion, hemodynamically significant stenosis, or other acute vascular abnormality. Eccentric plaque within the distal  left CCA with associated stenosis of up to 40-50% by NASCET criteria. No hemodynamically significant stenosis about the right carotid artery system. Wide patency of both vertebral arteries within the neck. Right vertebral artery dominant.  Assessment: [name] [name] is a 75 y.o. male PMHx as above with paroxysms of dizziness transferred for MRI brain and MRA head and neck.  MRI did not show acute ischemic changes, hemorrhage, related mass lesion.  MRA head and neck did not show large vessel occlusion or flow-limiting stenosis.  Distal left CCA with 40 to 50% stenosis with eccentric plaque.  Symptoms more suggestive of peripheral versus central.  The patient is on apixaban, aspirin and statin and has very close  follow-up.  Plan: Continue AC, ASA, statin  Follow-up PCP and outpatient neurologist. Please call for questions.  Electronically signed by:  Lynnae Sandhoff, MD Page: [phone] 08/31/2021, 12:56 AM

## 2021-09-01 ENCOUNTER — Encounter: Payer: Self-pay | Admitting: Family Medicine

## 2021-09-01 ENCOUNTER — Telehealth: Payer: Self-pay | Admitting: Endocrinology

## 2021-09-01 ENCOUNTER — Other Ambulatory Visit: Payer: Self-pay

## 2021-09-01 ENCOUNTER — Other Ambulatory Visit (HOSPITAL_BASED_OUTPATIENT_CLINIC_OR_DEPARTMENT_OTHER): Payer: Self-pay

## 2021-09-01 ENCOUNTER — Ambulatory Visit (INDEPENDENT_AMBULATORY_CARE_PROVIDER_SITE_OTHER): Payer: Medicare Other | Admitting: Family Medicine

## 2021-09-01 DIAGNOSIS — E559 Vitamin D deficiency, unspecified: Secondary | ICD-10-CM

## 2021-09-01 DIAGNOSIS — I10 Essential (primary) hypertension: Secondary | ICD-10-CM | POA: Diagnosis not present

## 2021-09-01 DIAGNOSIS — N184 Chronic kidney disease, stage 4 (severe): Secondary | ICD-10-CM

## 2021-09-01 DIAGNOSIS — G3184 Mild cognitive impairment, so stated: Secondary | ICD-10-CM

## 2021-09-01 DIAGNOSIS — I4821 Permanent atrial fibrillation: Secondary | ICD-10-CM

## 2021-09-01 DIAGNOSIS — F418 Other specified anxiety disorders: Secondary | ICD-10-CM

## 2021-09-01 DIAGNOSIS — R42 Dizziness and giddiness: Secondary | ICD-10-CM

## 2021-09-01 DIAGNOSIS — E1165 Type 2 diabetes mellitus with hyperglycemia: Secondary | ICD-10-CM

## 2021-09-01 DIAGNOSIS — E785 Hyperlipidemia, unspecified: Secondary | ICD-10-CM | POA: Diagnosis not present

## 2021-09-01 MED ORDER — BUSPIRONE HCL 7.5 MG PO TABS
7.5000 mg | ORAL_TABLET | Freq: Two times a day (BID) | ORAL | 3 refills | Status: DC
  Filled 2021-09-01: qty 60, 30d supply, fill #0
  Filled 2021-12-09: qty 180, 90d supply, fill #1

## 2021-09-01 NOTE — Progress Notes (Signed)
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 abnormal foci of restricted diffusion to suggest acute or subacute ischemia. Gray-white matter differentiation maintained. No encephalomalacia to suggest chronic cortical infarction. No evidence for acute or chronic intracranial hemorrhage. No mass lesion, midline shift or mass effect. No hydrocephalus or extra-axial fluid collection. Pituitary gland and suprasellar region normal. Midline structures intact. Vascular: Major intracranial vascular flow voids are maintained. Skull and upper cervical spine: Craniocervical junction within normal limits. Bone marrow signal intensity normal. No scalp soft tissue abnormality. Sinuses/Orbits: Globes and orbital soft tissues within normal limits. Minimal mucosal thickening noted within the ethmoidal air cells. Paranasal sinuses are otherwise clear. Trace left mastoid effusion noted, of doubtful significance. Inner ear structures grossly normal. Other: None. MRA HEAD FINDINGS Anterior circulation: Visualized distal cervical segments of the internal carotid arteries are patent with antegrade flow. Petrous, cavernous, and supraclinoid segments patent without stenosis or other abnormality. A1 segments patent  bilaterally. Left A1 slightly hypoplastic. Normal anterior communicating artery complex. Anterior cerebral arteries patent to their distal aspects without stenosis. Normal in stenosis or occlusion. Normal MCA bifurcations. Distal MCA branches well perfused and symmetric. Posterior circulation: Visualized vertebral arteries patent to the vertebrobasilar junction without stenosis. Right vertebral artery dominant. Right PICA origin patent. Left PICA origin not visualized. Tiny short-segment fenestration noted at the vertebrobasilar junction. Basilar patent to its distal aspect without stenosis. Superior cerebellar arteries patent  bilaterally. Right PCA supplied via the basilar. Left PCA supplied via the basilar as well as a prominent left posterior communicating artery. Both PCAs well perfused or distal aspects without stenosis. Anatomic variants: None significant.  No intracranial aneurysm. MRA NECK FINDINGS Aortic arch: Visualized aortic arch normal in caliber with normal 3 vessel morphology. No hemodynamically significant stenosis seen about the origin of the great vessels. Right carotid system: Right common carotid artery patent from its origin to the bifurcation without stenosis. No significant atheromatous narrowing or irregularity about the right bifurcation. Right ICA patent distally without stenosis, evidence for dissection or occlusion. Left carotid system: Left CCA widely patent proximally. Eccentric plaque within the distal left CCA with associated stenosis of up to 50% by NASCET criteria (series 2, image 92). No significant atheromatous change about the left carotid bulb distally. Left ICA widely patent without stenosis, evidence for dissection or occlusion. Vertebral arteries: Both vertebral arteries arise from the subclavian arteries. No proximal subclavian artery stenosis. Right vertebral artery dominant. Vertebral arteries patent without stenosis, evidence for dissection or occlusion. Other: Patient is  status post ACDF at C5-C7. Median sternotomy wires noted. IMPRESSION: MRI HEAD: 1. No acute intracranial abnormality. 2. Age-related cerebral atrophy with mild chronic small vessel ischemic disease. MRA HEAD: Negative intracranial MRA. No large vessel occlusion, hemodynamically significant stenosis, or other acute vascular abnormality. MRA NECK: 1. Eccentric plaque within the distal left CCA with associated stenosis of up to 40-50% by NASCET criteria. 2. No hemodynamically significant stenosis about the right carotid artery system. 3. Wide patency of both vertebral arteries within the neck. Right vertebral artery dominant. Electronically Signed   By: Jeannine Boga [name]   On: 08/30/2021 22:01     Assessment & Plan:  Plan    Meds ordered this encounter  Medications   busPIRone (BUSPAR) 7.5 MG tablet    Sig: Take 1 tablet (7.5 mg total) by mouth 2 (two) times daily.    Dispense:  60 tablet    Refill:  3     Problem List Items Addressed This Visit     Diabetes mellitus type II, uncontrolled ([place]) (Chronic)    Is following with endocrinology now minimize simple carbs. Increase exercise as tolerated. Continue current meds      Dizziness    Had a notable episode of the room spinning about 2 days ago. No falls or syncope. Has not recurred. Encouraged to hydrate and eat protein every 4 hours. Report if worsens.       Depression with anxiety    Now with significant grief after his wife died suddenly of a massive MI while recovering from [place] 31. He is managing with the support of family and friends. He declines any medications or counseling at this time but if he worsens and would like to proceed he will let us know. Refill given on Buspar 7.5 mg po bid      Relevant Medications   busPIRone (BUSPAR) 7.5 MG tablet   Hyperlipidemia LDL goal <70    Encourage heart healthy diet such as MIND or DASH diet, increase exercise, avoid trans fats, simple carbohydrates and processed foods,  consider a krill or fish or flaxseed oil cap daily. Tolerating Atorvastatin      Essential hypertension    Well controlled, no changes to meds. Encouraged heart healthy diet such as the DASH diet and exercise as tolerated.       Atrial fibrillation ([place]) [I48.91]    Rate controlled and tolerating meds  Vitamin D deficiency    Supplement and monitor      MCI (mild cognitive impairment)    Following with neurology and trying to stay active      CKD (chronic kidney disease), stage IV ([place])    Hydrate and monitor       Follow-up: Return in about 6 weeks (around 10/13/2021).  I, Suezanne Jacquet, acting as a scribe for Penni Homans, MD, have documented all relevent documentation on behalf of Penni Homans, MD, as directed by Penni Homans, MD while in the presence of Penni Homans, MD. DO:09/04/21.  I, [name], MD personally performed the services described in this documentation. All medical record entries made by the scribe were at my direction and in my presence. I have reviewed the chart and agree that the record reflects my personal performance and is accurate and complete

## 2021-09-01 NOTE — Patient Instructions (Signed)
Tdap is the tetanus shot you are due for, can get at the pharmacy but may not be paid for by insurance but if you get a cut in the skin we can give you a shot at the office and insurance   Now covid booster in October. [facility] pharmacy M-F 9-3  Shingrix is the new shingles shot, 2 shots over 2-6 months, confirm coverage with insurance and document, then can return here for shots with nurse appt or at pharmacy   Flu shot can be given with the COVID shot   Paxlovid or Molnupiravir is the new COVID medication we can give you if you get COVID so make sure you test if you have symptoms because we have to treat by day 5 of symptoms for it to be effective. If you are positive let us know so we can treat. If a home test is negative and your symptoms are persistent get a PCR test. Can check testing locations at [email or web address] If you are positive we will make an appointment with [place] and we will send in Paxlovid or Molnupiravir    Vertigo Vertigo is the feeling that you or your surroundings are moving when they are not. This feeling can come and go at any time. Vertigo often goes away on its own. Vertigo can be dangerous if it occurs while you are doing something that could endanger yourself or others, such as driving or operating machinery. Your health care provider will do tests to try to determine the cause of your vertigo. Tests will also help your health care provider decide how best to treat your condition. Follow these instructions at home: Eating and drinking   Dehydration can make vertigo worse. Drink enough fluid to keep your urine pale yellow. Do not drink alcohol. Activity Return to your normal activities as told by your health care provider. Ask your health care provider what activities are safe for you. In the morning, first sit up on the side of the bed. When you feel okay, stand slowly while you hold onto something until you know that your balance is fine. Move slowly. Avoid sudden body or  head movements or certain positions, as told by your health care provider. If you have trouble walking or keeping your balance, try using a cane for stability. If you feel dizzy or unstable, sit down right away. Avoid doing any tasks that would cause danger to you or others if vertigo occurs. Avoid bending down if you feel dizzy. Place items in your home so that they are easy for you to reach without bending or leaning over. Do not drive or use machinery if you feel dizzy. General instructions Take over-the-counter and prescription medicines only as told by your health care provider. Keep all follow-up visits. This is important. Contact a health care provider if: Your medicines do not relieve your vertigo or they make it worse. Your condition gets worse or you develop new symptoms. You have a fever. You develop nausea or vomiting, or if nausea gets worse. Your family or friends notice any behavioral changes. You have numbness or a prickling and tingling sensation in part of your body. Get help right away if you: Are always dizzy or you faint. Develop severe headaches. Develop a stiff neck. Develop sensitivity to light. Have difficulty moving or speaking. Have weakness in your hands, arms, or legs. Have changes in your hearing or vision. These symptoms may represent a serious problem that is an emergency. Do not wait to see if  the symptoms will go away. Get medical help right away. Call your local emergency services (911 in the [place] Do not drive yourself to the hospital. Summary Vertigo is the feeling that you or your surroundings are moving when they are not. Your health care provider will do tests to try to determine the cause of your vertigo. Follow instructions for home care. You may be told to avoid certain tasks, positions, or movements. Contact a health care provider if your medicines do not relieve your symptoms, or if you have a fever, nausea, vomiting, or changes in  behavior. Get help right away if you have severe headaches or difficulty speaking, or you develop hearing or vision problems. This information is not intended to replace advice given to you by your health care provider. Make sure you discuss any questions you have with your health care provider. Document Revised: 10/22/2020 Document Reviewed: 10/22/2020 Elsevier Patient Education  2022 [employer]. if you would like it. Check with your pharmacy before we meet to confirm they have it in stock, if they do not then we can get the prescription at the [facility]

## 2021-09-01 NOTE — Telephone Encounter (Signed)
Patient called re: Patient is trying to get scheduled for knee replacement surgery, however ,the Surgeon has not received recent lab results from Dr. Loanne Drilling so that Patient can have surgery (Surgeon requires reviewing labs before he will schedule the surgery). Patient requests recent labs including A1C be faxed to Marlin Canary, Surgeon Coordinator at Fax# [phone]

## 2021-09-02 NOTE — Telephone Encounter (Signed)
Advised patient that A1c had been faxed to number provided

## 2021-09-04 NOTE — Assessment & Plan Note (Signed)
Well controlled, no changes to meds. Encouraged heart healthy diet such as the DASH diet and exercise as tolerated.  °

## 2021-09-04 NOTE — Assessment & Plan Note (Signed)
Encourage heart healthy diet such as MIND or DASH diet, increase exercise, avoid trans fats, simple carbohydrates and processed foods, consider a krill or fish or flaxseed oil cap daily. Tolerating Atorvastatin 

## 2021-09-04 NOTE — Assessment & Plan Note (Signed)
Hydrate and monitor 

## 2021-09-04 NOTE — Assessment & Plan Note (Signed)
Supplement and monitor 

## 2021-09-04 NOTE — Assessment & Plan Note (Signed)
Had a notable episode of the room spinning about 2 days ago. No falls or syncope. Has not recurred. Encouraged to hydrate and eat protein every 4 hours. Report if worsens.

## 2021-09-04 NOTE — Assessment & Plan Note (Signed)
Is following with endocrinology now minimize simple carbs. Increase exercise as tolerated. Continue current meds

## 2021-09-04 NOTE — Assessment & Plan Note (Signed)
Rate controlled and tolerating meds.  

## 2021-09-04 NOTE — Assessment & Plan Note (Addendum)
Now with significant grief after his wife died suddenly of a massive MI while recovering from [place] 15. He is managing with the support of family and friends. He declines any medications or counseling at this time but if he worsens and would like to proceed he will let us know. Refill given on Buspar 7.5 mg po bid

## 2021-09-04 NOTE — Assessment & Plan Note (Signed)
Following with neurology and trying to stay active

## 2021-09-07 ENCOUNTER — Other Ambulatory Visit (HOSPITAL_BASED_OUTPATIENT_CLINIC_OR_DEPARTMENT_OTHER): Payer: Self-pay

## 2021-09-07 ENCOUNTER — Encounter: Payer: Self-pay | Admitting: Family Medicine

## 2021-09-07 MED ORDER — MECLIZINE HCL 25 MG PO TABS
25.0000 mg | ORAL_TABLET | Freq: Three times a day (TID) | ORAL | 0 refills | Status: DC | PRN
  Filled 2021-09-07: qty 30, 10d supply, fill #0

## 2021-09-08 ENCOUNTER — Other Ambulatory Visit (HOSPITAL_BASED_OUTPATIENT_CLINIC_OR_DEPARTMENT_OTHER): Payer: Self-pay

## 2021-09-09 ENCOUNTER — Encounter: Payer: Self-pay | Admitting: Family Medicine

## 2021-09-09 ENCOUNTER — Other Ambulatory Visit: Payer: Self-pay | Admitting: Orthopaedic Surgery

## 2021-09-09 ENCOUNTER — Other Ambulatory Visit: Payer: Self-pay | Admitting: Family Medicine

## 2021-09-09 ENCOUNTER — Other Ambulatory Visit (HOSPITAL_BASED_OUTPATIENT_CLINIC_OR_DEPARTMENT_OTHER): Payer: Self-pay

## 2021-09-09 MED ORDER — HYDROXYZINE HCL 10 MG PO TABS
10.0000 mg | ORAL_TABLET | Freq: Three times a day (TID) | ORAL | 1 refills | Status: DC | PRN
  Filled 2021-09-09: qty 30, 10d supply, fill #0
  Filled 2021-10-14: qty 30, 10d supply, fill #1

## 2021-09-10 ENCOUNTER — Other Ambulatory Visit (HOSPITAL_BASED_OUTPATIENT_CLINIC_OR_DEPARTMENT_OTHER): Payer: Self-pay

## 2021-09-10 ENCOUNTER — Other Ambulatory Visit: Payer: Self-pay | Admitting: Family Medicine

## 2021-09-10 MED ORDER — TRAMADOL HCL 50 MG PO TABS
50.0000 mg | ORAL_TABLET | Freq: Two times a day (BID) | ORAL | 0 refills | Status: DC | PRN
  Filled 2021-09-10: qty 15, 3d supply, fill #0

## 2021-09-11 ENCOUNTER — Other Ambulatory Visit (HOSPITAL_BASED_OUTPATIENT_CLINIC_OR_DEPARTMENT_OTHER): Payer: Self-pay

## 2021-09-11 ENCOUNTER — Telehealth: Payer: Self-pay | Admitting: Interventional Cardiology

## 2021-09-11 ENCOUNTER — Other Ambulatory Visit: Payer: Self-pay | Admitting: Family Medicine

## 2021-09-11 MED ORDER — LEVOTHYROXINE SODIUM 100 MCG PO TABS
ORAL_TABLET | Freq: Every day | ORAL | 1 refills | Status: DC
  Filled 2021-09-11: qty 90, 90d supply, fill #0
  Filled 2022-01-06: qty 90, 90d supply, fill #1

## 2021-09-11 NOTE — Telephone Encounter (Signed)
Patient calling in wanting to talk to Dr Irish Lack in regards to his up coming procedure/surgery he has. Patient states that his wife died two weeks ago because her heart bleed out during a procedure.patient is on edge about it because of his heart condition. He would like for Dr Irish Lack give him a call when he get a chance. Just want to see if he needs to go through with it. Please advise.

## 2021-09-11 NOTE — Telephone Encounter (Signed)
Left message for patient to call back  

## 2021-09-11 NOTE — Telephone Encounter (Signed)
Reviewed with Dr Irish Lack and it should be fine for patient to proceed with surgery as planned. I placed call to patient and left message to call office.

## 2021-09-11 NOTE — Telephone Encounter (Signed)
Patient states he is having leg surgery 10/18 and wants to know if he should have someone with him. He states he already has clearance. He says his wife died having a minor surgery recently so he just wants to make sure.

## 2021-09-14 ENCOUNTER — Other Ambulatory Visit: Payer: Self-pay | Admitting: Orthopaedic Surgery

## 2021-09-16 NOTE — Progress Notes (Addendum)
Anesthesia Review:  HWT:UUEKCM [name] [name] - LOV 09/01/21  Cardiologist : [name] [name] lov 03/19/21  Clearance 06/14/21 - epic  Endocrinologist- - [name] [name] 08/27/21- LOV  [name] [name]- [name] Margette Fast 07/07/21  Chest x-ray : 03/25/21-2v  EKG : 08/30/21  Monitor- 07/30/20  Echo : 01/08/20  Stress test: Cardiac Cath : 01/07/20  Activity level:  Sleep Study/ CPAP : Fasting Blood Sugar :      / Checks Blood Sugar -- times a day:   Blood Thinner/ Instructions /Last Dose: ASA / Instructions/ Last Dose :   DM- type  2  08/27/21-hgba1c- 7.5  81 mg Aspirin  Eliquis - pt stated he was told to stop a week prior to surgery Covid test on 09/18/21  Positive for covid on 07/17/21- Did not see in epic until after preop.  Made [name] [name] aware.   Wife deceased approx 10 days ago .  PT handling very well.   Glucose 71 at preop.  PT feels fine.  [name] [name] made aware. PT going to eat breakfast when he leaves preop appt.  PT given peanut butter crackers and bottled water to eat on way.   BMp done 09/18/21 routed to [name] Rhona Raider.  IN ED on 08/30/21 for dizziness.

## 2021-09-16 NOTE — Progress Notes (Signed)
DUE TO COVID-19 ONLY ONE VISITOR IS ALLOWED TO COME WITH YOU AND STAY IN THE WAITING ROOM ONLY DURING PRE OP AND PROCEDURE DAY OF SURGERY.  2 VISITOR  MAY VISIT WITH YOU AFTER SURGERY IN YOUR PRIVATE ROOM DURING VISITING HOURS ONLY!  YOU NEED TO HAVE A COVID 19 TEST ON___   09/18/2021 @_  @_from  8am-3pm _____, THIS TEST MUST BE DONE BEFORE SURGERY,  Covid test is done at [place], [place] [address].  This is a drive thru.  No appt required. Please see map.                 Your procedure is scheduled on:  09/22/2021   Report to [facility] Main  Entrance   Report to admitting at   0515am      Call this number if you have problems the morning of surgery [phone]    REMEMBER: NO  SOLID FOOD CANDY OR GUM AFTER MIDNIGHT. CLEAR LIQUIDS UNTIL    0445am      . NOTHING BY MOUTH EXCEPT CLEAR LIQUIDS UNTIL  0445am   . PLEASE FINISH ENSURE DRINK PER SURGEON ORDER  WHICH NEEDS TO BE COMPLETED AT    0445am   .      CLEAR LIQUID DIET   Foods Allowed                                                                    Coffee and tea, regular and decaf                            Fruit ices (not with fruit pulp)                                      Iced Popsicles                                    Carbonated beverages, regular and diet                                    Cranberry, grape and apple juices Sports drinks like Gatorade Lightly seasoned clear broth or consume(fat free) Sugar, honey syrup ___________________________________________________________________      BRUSH YOUR TEETH MORNING OF SURGERY AND RINSE YOUR MOUTH OUT, NO CHEWING GUM CANDY OR MINTS.     Take these medicines the morning of surgery with A SIP OF WATER:  zoloft, imdur, synthroid, toprol, amlodipine, guspar, depakote, zyrtec, pepcid protonix   DO NOT TAKE ANY DIABETIC MEDICATIONS DAY OF YOUR SURGERY                               You may not have any metal on your body including hair pins  and              piercings  Do not wear jewelry, make-up, lotions, powders or perfumes, deodorant  Do not wear nail polish on your fingernails.  Do not shave  48 hours prior to surgery.              Men may shave face and neck.   Do not bring valuables to the hospital. [place].  Contacts, dentures or bridgework may not be worn into surgery.  Leave suitcase in the car. After surgery it may be brought to your room.     Patients discharged the day of surgery will not be allowed to drive home. IF YOU ARE HAVING SURGERY AND GOING HOME THE SAME DAY, YOU MUST HAVE AN ADULT TO DRIVE YOU HOME AND BE WITH YOU FOR 24 HOURS. YOU MAY GO HOME BY TAXI OR UBER OR ORTHERWISE, BUT AN ADULT MUST ACCOMPANY YOU HOME AND STAY WITH YOU FOR 24 HOURS.  Name and phone number of your driver:  Special Instructions: N/A              Please read over the following fact sheets you were given: _____________________________________________________________________  [facility] - Preparing for Surgery Before surgery, you can play an important role.  Because skin is not sterile, your skin needs to be as free of germs as possible.  You can reduce the number of germs on your skin by washing with CHG (chlorahexidine gluconate) soap before surgery.  CHG is an antiseptic cleaner which kills germs and bonds with the skin to continue killing germs even after washing. Please DO NOT use if you have an allergy to CHG or antibacterial soaps.  If your skin becomes reddened/irritated stop using the CHG and inform your nurse when you arrive at Short Stay. Do not shave (including legs and underarms) for at least 48 hours prior to the first CHG shower.  You may shave your face/neck. Please follow these instructions carefully:  1.  Shower with CHG Soap the night before surgery and the  morning of Surgery.  2.  If you choose to wash your hair, wash your hair first as usual with your   normal  shampoo.  3.  After you shampoo, rinse your hair and body thoroughly to remove the  shampoo.                           4.  Use CHG as you would any other liquid soap.  You can apply chg directly  to the skin and wash                       Gently with a scrungie or clean washcloth.  5.  Apply the CHG Soap to your body ONLY FROM THE NECK DOWN.   Do not use on face/ open                           Wound or open sores. Avoid contact with eyes, ears mouth and genitals (private parts).                       Wash face,  Genitals (private parts) with your normal soap.             6.  Wash thoroughly, paying special attention to the area where your surgery  will be performed.  7.  Thoroughly rinse your body with  warm water from the neck down.  8.  DO NOT shower/wash with your normal soap after using and rinsing off  the CHG Soap.                9.  Pat yourself dry with a clean towel.            10.  Wear clean pajamas.            11.  Place clean sheets on your bed the night of your first shower and do not  sleep with pets. Day of Surgery : Do not apply any lotions/deodorants the morning of surgery.  Please wear clean clothes to the hospital/surgery center.  FAILURE TO FOLLOW THESE INSTRUCTIONS MAY RESULT IN THE CANCELLATION OF YOUR SURGERY PATIENT SIGNATURE_________________________________  NURSE SIGNATURE__________________________________  ________________________________________________________________________

## 2021-09-17 ENCOUNTER — Other Ambulatory Visit (HOSPITAL_BASED_OUTPATIENT_CLINIC_OR_DEPARTMENT_OTHER): Payer: Self-pay

## 2021-09-17 MED FILL — Continuous Glucose System Sensor: 28 days supply | Qty: 2 | Fill #4 | Status: AC

## 2021-09-18 ENCOUNTER — Encounter (HOSPITAL_COMMUNITY)
Admission: RE | Admit: 2021-09-18 | Discharge: 2021-09-18 | Disposition: A | Discharge status: Home / Self Care | Payer: Medicare Other | Source: Ambulatory Visit | Attending: Orthopaedic Surgery | Admitting: Orthopaedic Surgery

## 2021-09-18 ENCOUNTER — Encounter (HOSPITAL_COMMUNITY): Payer: Self-pay

## 2021-09-18 ENCOUNTER — Other Ambulatory Visit: Payer: Self-pay

## 2021-09-18 ENCOUNTER — Other Ambulatory Visit: Payer: Self-pay | Admitting: Orthopaedic Surgery

## 2021-09-18 DIAGNOSIS — Z79899 Other long term (current) drug therapy: Secondary | ICD-10-CM | POA: Diagnosis not present

## 2021-09-18 DIAGNOSIS — Z794 Long term (current) use of insulin: Secondary | ICD-10-CM | POA: Diagnosis not present

## 2021-09-18 DIAGNOSIS — Z7901 Long term (current) use of anticoagulants: Secondary | ICD-10-CM | POA: Insufficient documentation

## 2021-09-18 DIAGNOSIS — I4510 Unspecified right bundle-branch block: Secondary | ICD-10-CM | POA: Insufficient documentation

## 2021-09-18 DIAGNOSIS — I129 Hypertensive chronic kidney disease with stage 1 through stage 4 chronic kidney disease, or unspecified chronic kidney disease: Secondary | ICD-10-CM | POA: Insufficient documentation

## 2021-09-18 DIAGNOSIS — I2510 Atherosclerotic heart disease of native coronary artery without angina pectoris: Secondary | ICD-10-CM | POA: Diagnosis not present

## 2021-09-18 DIAGNOSIS — K219 Gastro-esophageal reflux disease without esophagitis: Secondary | ICD-10-CM | POA: Insufficient documentation

## 2021-09-18 DIAGNOSIS — Z01818 Encounter for other preprocedural examination: Secondary | ICD-10-CM | POA: Diagnosis present

## 2021-09-18 DIAGNOSIS — N183 Chronic kidney disease, stage 3 unspecified: Secondary | ICD-10-CM | POA: Insufficient documentation

## 2021-09-18 DIAGNOSIS — Z7982 Long term (current) use of aspirin: Secondary | ICD-10-CM | POA: Diagnosis not present

## 2021-09-18 DIAGNOSIS — I480 Paroxysmal atrial fibrillation: Secondary | ICD-10-CM | POA: Insufficient documentation

## 2021-09-18 DIAGNOSIS — M1711 Unilateral primary osteoarthritis, right knee: Secondary | ICD-10-CM | POA: Diagnosis not present

## 2021-09-18 DIAGNOSIS — E1122 Type 2 diabetes mellitus with diabetic chronic kidney disease: Secondary | ICD-10-CM | POA: Insufficient documentation

## 2021-09-18 HISTORY — DX: Cardiac murmur, unspecified: R01.1

## 2021-09-18 LAB — SURGICAL PCR SCREEN
MRSA, PCR: NEGATIVE
Staphylococcus aureus: POSITIVE — AB

## 2021-09-18 LAB — BASIC METABOLIC PANEL
Anion gap: 8 (ref 5–15)
BUN: 23 mg/dL (ref 8–23)
CO2: 26 mmol/L (ref 22–32)
Calcium: 8.7 mg/dL — ABNORMAL LOW (ref 8.9–10.3)
Chloride: 104 mmol/L (ref 98–111)
Creatinine, Ser: 1.69 mg/dL — ABNORMAL HIGH (ref 0.61–1.24)
GFR, Estimated: 42 mL/min — ABNORMAL LOW (ref >60)
Glucose, Bld: 72 mg/dL (ref 70–99)
Potassium: 4.6 mmol/L (ref 3.5–5.1)
Sodium: 138 mmol/L (ref 135–145)

## 2021-09-18 LAB — CBC
HCT: 39.7 % (ref 39.0–52.0)
Hemoglobin: 13.2 g/dL (ref 13.0–17.0)
MCH: 30.6 pg (ref 26.0–34.0)
MCHC: 33.2 g/dL (ref 30.0–36.0)
MCV: 92.1 fL (ref 80.0–100.0)
Platelets: 190 10*3/uL (ref 150–400)
RBC: 4.31 MIL/uL (ref 4.22–5.81)
RDW: 14.6 % (ref 11.5–15.5)
WBC: 6.3 10*3/uL (ref 4.0–10.5)
nRBC: 0 % (ref 0.0–0.2)

## 2021-09-18 LAB — GLUCOSE, CAPILLARY: Glucose-Capillary: 71 mg/dL (ref 70–99)

## 2021-09-18 NOTE — Anesthesia Preprocedure Evaluation (Addendum)
Anesthesia Evaluation  Patient identified by MRN, date of birth, ID band Patient awake    Reviewed: Allergy & Precautions, NPO status , Patient's Chart, lab work & pertinent test results  Airway Mallampati: II  TM Distance: >3 FB Neck ROM: Full    Dental no notable dental hx.    Pulmonary neg pulmonary ROS,    Pulmonary exam normal breath sounds clear to auscultation       Cardiovascular hypertension, + CAD, + CABG and + Peripheral Vascular Disease  Normal cardiovascular exam+ dysrhythmias  Rhythm:Regular Rate:Normal     Neuro/Psych negative neurological ROS  negative psych ROS   GI/Hepatic negative GI ROS, Neg liver ROS,   Endo/Other  negative endocrine ROSdiabetes  Renal/GU negative Renal ROS  negative genitourinary   Musculoskeletal negative musculoskeletal ROS (+)   Abdominal   Peds negative pediatric ROS (+)  Hematology negative hematology ROS (+)   Anesthesia Other Findings   Reproductive/Obstetrics negative OB ROS                            Anesthesia Physical Anesthesia Plan  ASA: 3  Anesthesia Plan: Spinal   Post-op Pain Management:  Regional for Post-op pain   Induction: Intravenous  PONV Risk Score and Plan: 1 and Treatment may vary due to age or medical condition  Airway Management Planned: Simple Face Mask  Additional Equipment:   Intra-op Plan:   Post-operative Plan:   Informed Consent: I have reviewed the patients History and Physical, chart, labs and discussed the procedure including the risks, benefits and alternatives for the proposed anesthesia with the patient or authorized representative who has indicated his/her understanding and acceptance.     Dental advisory given  Plan Discussed with: CRNA and Surgeon  Anesthesia Plan Comments: (See PAT note 09/18/2021, Konrad Felix Ward, PA-C)       Anesthesia Quick Evaluation

## 2021-09-18 NOTE — Progress Notes (Signed)
Anesthesia Chart Review   Case: [id number] Date/Time: 09/22/21 0730   Procedure: RIGHT TOTAL KNEE ARTHROPLASTY (Right: Knee)   Anesthesia type: Spinal   Pre-op diagnosis: RIGHT KNEE DEGENERATIVE JOINT DISEASE   Location: [facility] 06 / [facility]   Surgeons: [name], MD       DISCUSSION:75 y.o. never smoker with h/o OSA, HTN, GERD, RBBB, CAD (CABG 2000), PAF, CKD III, DM II, right knee djd scheduled for above procedure 09/22/2021 with Dr. [name].   Per cardiology preoperative evaluation 06/26/2021, "[name] [name] was last seen on 4/41/22 by [name] for cardiac clearance.  He reported some DOE, attributed to deconditioning 2/2 needing a knee replacement. No chest pain. No further testing was felt needed prior to proceeding with surgery. Since that day, ELISAH PARMER has done well.  He cannot achieve 4 METS with consideration of his knee issues.   His RCRI score is calculated at >11% risk of MACE when considering his CAD, renal function, and that he is on insulin for DM2. Based on ACC/AHA guidelines, he may proceed with the planned procedure without further cardiovascular testing. Last cath 01/07/20 with stable images compared with 2019 and right heart pressures nl without explanation for his DOE. Echo EF 60-65% with mildly incrased LVH of the basal septum.   He should hold his Eliquis for 3 days prior to the procedure."  Case previously scheduled 07/21/21, postponed due to [place].  No need for pre-op COVID test.   Anticipate pt can proceed with planned procedure barring acute status change.   VS: BP (!) 148/66   Pulse (!) 56   Temp 36.7 C (Oral)   Resp 16   Ht 5\' 9"  (1.753 m)   Wt 92.5 kg   SpO2 100%   BMI 30.13 kg/m   PROVIDERS: [name], MD is PCP    LABS: Labs reviewed: Acceptable for surgery. (all labs ordered are listed, but only abnormal results are displayed)  Labs Reviewed  BASIC METABOLIC PANEL - Abnormal; Notable for the following components:       Result Value   Creatinine, Ser 1.69 (*)    Calcium 8.7 (*)    GFR, Estimated 42 (*)    All other components within normal limits  SURGICAL PCR SCREEN  CBC  GLUCOSE, CAPILLARY     IMAGES:   EKG: 08/30/2021 Rate 59 bpm  Sinus rhythm  Prolonged PR interval  RBBB No change from prior EKG  CV: Cardiac Cath 01/07/20 Right heart cath demonstrated normal filling pressures and pulmonary artery pressure. Total occlusion of the mid LAD and diagonal.  Ramus intermedius is also totally occluded.  Mild to moderate disease is noted in the distal circumflex.  The native right coronary is totally occluded beyond the origin of the PDA. The bypass graft to the continuation of the right coronary is widely patent. The bypass graft to the diagonal is widely patent. The bypass graft to the ramus intermedius is widely patent. LIMA to the LAD is widely patent. Left ventricular function was not assessed with this study.  Left ventricular end-diastolic pressure was normal.   RECOMMENDATIONS:   In review of images, no significant change has occurred compared to 2019. If angina, consider microvascular origin.  Uncertain explanation for the patient's dyspnea.  Echo 01/08/2020  1. Left ventricular ejection fraction, by visual estimation, is 60 to  65%. The left ventricle has normal function. Mildly increased left  ventricular posterior wall thickness. There is mildly increased left  ventricular hypertrophy of the basal septum.   2. The left ventricle has no regional wall motion abnormalities.   3. Left ventricular diastolic parameters are indeterminate.   4. Elevated left ventricular end-diastolic pressure.   5. Global right ventricle has normal systolic function.The right  ventricular size is normal. No increase in right ventricular wall  thickness.   6. Left atrial size was normal.   7. Right atrial size was normal.   8. The mitral valve is normal in structure. Mild mitral valve   regurgitation. No evidence of mitral stenosis.   9. The tricuspid valve is normal in structure. Tricuspid valve  regurgitation is not demonstrated.  10. The aortic valve is normal in structure. Aortic valve regurgitation is  not visualized. No evidence of aortic valve sclerosis or stenosis.  11. The pulmonic valve was normal in structure. Pulmonic valve  regurgitation is trivial.  12. The inferior vena cava is normal in size with <50% respiratory  variability, suggesting right atrial pressure of 8 mmHg. Past Medical History:  Diagnosis Date   Acquired atrophy of thyroid 06/05/2013   Overview:  July 2014: controlled on synthroid 75mcg since 2012 May 2015: decreased to Synthroid 50 mcg  Last Assessment & Plan:  Pt doing well with most recent TSH within normal range.  Will continue current dosage of Synthroid 69mcg. Pt reminded to take medication on empty stomach. Will continue to monitor with periodic laboratory assessment in 3 months.    Anemia    hemoglobin 7.4, iron deficiency, January, 2011, 2 unit transfusion, endoscopy normal, capsule endoscopy February, 2011 no small bowel abnormalities.   Most likely source gastric erosions, followed by GI   Anxiety    Bradycardia    CAD (coronary artery disease)    A. CABG in 2000,status post cardiac cath in 2006, 2009 ....continued chest pain and SOB despite oral medication adjestments including Ranexa. B. Cath November 2009/ mRCA - 2.75 x 23 Abbott Xience V drug-eluting stent ...11/26/2008 to distal  RCA leading to acute marginal.  C. Cath 07/2012 for CP - stable anatomy, med rx. d. cath 2015 and 05/30/2015 stable anatomy, consider Myoview if has CP again   Carotid artery disease ([place]) 08/01/2015   Doppler, May 29, 2015, 1-39% bilateral ICA    Cerebral ischemia    MRI November, 2010, chronic microvascular ischemia   CKD (chronic kidney disease), stage III ([place])    Concussion    Depression    Bipolar   Diabetes mellitus ([place])    Edema     Essential hypertension 06/05/2013   Overview:  July 2014: Controlled with Bystolic Sep 8841: Changed to atenolol.  Last Assessment & Plan:  Will change to atenolol for cost and follow Improved.  Medication compliance strongly encouraged BP: 116/64 mmHg    Overview:  July 2014: Controlled with Bystolic Sep 6606: Changed to atenolol.  Last Assessment & Plan:  Will change to atenolol for cost and follow   Falling episodes    these have occurred in the past and again recurring 2011   Family history of adverse reaction to anesthesia    "mother died during bypass surgery but not sure if it has to do with anesthesia"   Gastric ulcer    GERD (gastroesophageal reflux disease)    H/O medication noncompliance    Due to loss of insurance   H/O multiple concussions    Hard of hearing    Heart murmur    History of blood transfusion 12/20/2013   History of  kidney stones    Hx of CABG    2000,  / one median sternotomy suture broken her chest x-ray November, 2010, no clinical significance   Hyperlipidemia    Hypertension    pt. denies   Iron deficiency anemia    Long term (current) use of anticoagulants [Z79.01] 07/20/2016   Low back pain 06/12/2009   Qualifier: Diagnosis of  By: [name]    Nephrolithiasis    Orthostasis    OSA (obstructive sleep apnea)    PAF (paroxysmal atrial fibrillation) ([place])    a. dx 2017, started on amiodarone/warfarin but patient intermittently noncompliant.   PSVT (paroxysmal supraventricular tachycardia) ([place])    RBBB 07/09/2009   Qualifier: Diagnosis of  By: [name], MD, [facility], [name]    RLS (restless legs syndrome) 09/19/2009   Qualifier: Diagnosis of  By: [name]   Overview:  July 2014: Controlled with Mirapex  Last Assessment & Plan:  Patient is doing well. Will continue current management and follow clinically.   Seizure disorder ([place]) 01/09/2017   Spondylosis    C5-6, C6-7 MRI 2010   Syncope 03/2016   TBI (traumatic brain injury) 2019    Thyroid disease    Tubulovillous adenoma of colon 2007   Vitamin D deficiency 05/17/2017   Wears glasses     Past Surgical History:  Procedure Laterality Date   ANTERIOR CERVICAL DECOMP/DISCECTOMY FUSION N/A 05/31/2016   Procedure: ANTERIOR CERVICAL DECOMPRESSION/DISCECTOMY FUSION CERVICAL FIVE-SIX,CERVICAL SIX-SEVEN;  Surgeon: [name], MD;  Location: [place] [facility];  Service: Neurosurgery;  Laterality: N/A;   CARDIAC CATHETERIZATION N/A 05/30/2015   Procedure: Left Heart Cath and Coronary Angiography;  Surgeon: [name], MD;  Location: [place] CV LAB;  Service: Cardiovascular;  Laterality: N/A;   COLONOSCOPY     CORONARY ARTERY BYPASS GRAFT     2000   ESOPHAGOGASTRODUODENOSCOPY     LEFT HEART CATH AND CORS/GRAFTS ANGIOGRAPHY N/A 12/15/2017   Procedure: LEFT HEART CATH AND CORS/GRAFTS ANGIOGRAPHY;  Surgeon: [name], MD;  Location: [place] CV LAB;  Service: Cardiovascular;  Laterality: N/A;   LEFT HEART CATHETERIZATION WITH CORONARY/GRAFT ANGIOGRAM N/A 08/01/2012   Procedure: LEFT HEART CATHETERIZATION WITH Beatrix Fetters;  Surgeon: [name], MD;  Location: [facility] CATH LAB;  Service: Cardiovascular;  Laterality: N/A;   LEFT HEART CATHETERIZATION WITH CORONARY/GRAFT ANGIOGRAM N/A 01/03/2015   Procedure: LEFT HEART CATHETERIZATION WITH Beatrix Fetters;  Surgeon: [name], MD;  Location: [facility] CATH LAB;  Service: Cardiovascular;  Laterality: N/A;   NASAL SEPTUM SURGERY     UP3   PERCUTANEOUS CORONARY STENT INTERVENTION (PCI-S)  10/2008   mRCA PCI  2.75 x 23 Abbott Xience V drug-eluting stent    RIGHT/LEFT HEART CATH AND CORONARY/GRAFT ANGIOGRAPHY N/A 01/07/2020   Procedure: RIGHT/LEFT HEART CATH AND CORONARY/GRAFT ANGIOGRAPHY;  Surgeon: [name], MD;  Location: [place] CV LAB;  Service: Cardiovascular;  Laterality: N/A;   ULTRASOUND GUIDANCE FOR VASCULAR ACCESS  12/15/2017   Procedure: Ultrasound Guidance For Vascular Access;   Surgeon: [name], MD;  Location: [place] CV LAB;  Service: Cardiovascular;;    MEDICATIONS:  albuterol (VENTOLIN HFA) 108 (90 Base) MCG/ACT inhaler   amLODipine (NORVASC) 5 MG tablet   apixaban (ELIQUIS) 5 MG TABS tablet   aspirin EC 81 MG tablet   atorvastatin (LIPITOR) 80 MG tablet   busPIRone (BUSPAR) 7.5 MG tablet   cetirizine (ZYRTEC) 10 MG tablet   Continuous Blood Gluc Sensor (  FREESTYLE LIBRE 14 DAY SENSOR) MISC   Diclofenac Sodium (PENNSAID) 2 % SOLN   divalproex (DEPAKOTE) 500 MG DR tablet   famotidine (PEPCID) 20 MG tablet   fenofibrate (TRICOR) 145 MG tablet   furosemide (LASIX) 20 MG tablet   glucose blood (ACCU-CHEK AVIVA) test strip   hydrocortisone 2.5 % cream   hydrOXYzine (ATARAX/VISTARIL) 10 MG tablet   insulin glargine, 2 Unit Dial, (TOUJEO MAX SOLOSTAR) 300 UNIT/ML Solostar Pen   Insulin Pen Needle 31G X 8 MM MISC   Insulin Syringe-Needle U-100 31G X 5/16" 1 ML MISC   isosorbide mononitrate (IMDUR) 120 MG 24 hr tablet   levothyroxine (SYNTHROID) 100 MCG tablet   meclizine (ANTIVERT) 25 MG tablet   metoprolol succinate (TOPROL-XL) 50 MG 24 hr tablet   nitroGLYCERIN (NITROSTAT) 0.4 MG SL tablet   pantoprazole (PROTONIX) 40 MG tablet   pramipexole (MIRAPEX) 1 MG tablet   ranolazine (RANEXA) 500 MG 12 hr tablet   sertraline (ZOLOFT) 100 MG tablet   tiZANidine (ZANAFLEX) 4 MG tablet   traMADol (ULTRAM) 50 MG tablet   triamcinolone cream (KENALOG) 0.1 %   triamcinolone cream (KENALOG) 0.5 %   Vitamin D, Ergocalciferol, (DRISDOL) 1.25 MG (50000 UNIT) CAPS capsule   No current facility-administered medications for this encounter.    Konrad Felix Ward, PA-C [facility] [phone]

## 2021-09-19 LAB — SARS CORONAVIRUS 2 (TAT 6-24 HRS): SARS Coronavirus 2: NEGATIVE

## 2021-09-21 NOTE — Progress Notes (Addendum)
Called pt and verified with pt that he takes Imdur and Ranexa.  PT stated he was told to stop Ranexa 3 days prior to surgery by MD but he could not remember their name . Verified with pt all meds that he currently takes.  PT stated he was glad that I had called because his grandson misplaced his preop instructions.   Reviewed that pt is to arrive at [place] am at [facility].  PT is to complete G2 Lower sugar driink by 0445am.  PT may have clear liquids until 0445am.  PT is to eat a good healthy snack prior to bedtime.  PT remembered about the hibiclens shower and veriifes that he has hibiclens and G2 Lower Sugar drink.  Pt asked if he could  have unsweetened tea in the am as a clear liquid.  Nurse stated yes.  Reviewed with pt what meds he is to take am of surgery.  PT is to take Amlodipine, buspar, zyrtec, depakote, pepcid, imdur synthroid, toprol  and zoloft am of surgery.  PT is to take 1/2 dose of Toujeo am of surgery.  PT voiced understanding.  PT stated Eliquis was stopped 3 days prior to surgery.  Reviewed meds with pt x 3.  PT voiced understanding.   Called and spoke with daughter, Torrie Mayers who was there at pt's home while he was talking with nurse over phone.  Daughter stated she saw pt writing information down.   PT states" I wish my wife were here.  She was my Network engineer.  " .  Informed daughter on phone call with her that pt seemed slightly anxious and a litte overwhelmed at times.  Daughter agreed .   Informed Shawn Stall of above .  No new orders given.

## 2021-09-21 NOTE — H&P (Signed)
TOTAL KNEE ADMISSION H&P  Patient is being admitted for right total knee arthroplasty.  Subjective:  Chief Complaint:right knee pain.  HPI: [name] QUEBEDEAUX, 75 y.o. male, has a history of pain and functional disability in the right knee due to arthritis and has failed non-surgical conservative treatments for greater than 12 weeks to includeNSAID's and/or analgesics, corticosteriod injections, flexibility and strengthening excercises, use of assistive devices, weight reduction as appropriate, and activity modification.  Onset of symptoms was gradual, starting 5 years ago with gradually worsening course since that time. The patient noted no past surgery on the right knee(s).  Patient currently rates pain in the right knee(s) at 10 out of 10 with activity. Patient has night pain, worsening of pain with activity and weight bearing, pain that interferes with activities of daily living, crepitus, and joint swelling.  Patient has evidence of subchondral cysts, subchondral sclerosis, periarticular osteophytes, and joint space narrowing by imaging studies.  There is no active infection.  Patient Active Problem List   Diagnosis Date Noted   Sprain of anterior talofibular ligament of left ankle 01/08/2021   Achilles tendon pain 06/06/2020   Thoracic back pain 05/11/2020   CKD (chronic kidney disease), stage IV ([place])    Left ankle pain 12/30/2019   Frequency of urination 05/13/2019   Obesity 02/05/2019   Neck pain 02/05/2019   MCI (mild cognitive impairment) 02/05/2019   Fall at home, initial encounter 11/06/2018   Localized swelling of both lower extremities 07/13/2018   Cervical radiculopathy at C8 02/08/2018   Hypersomnolence 10/18/2017   Vitamin D deficiency 05/17/2017   Seizure disorder ([place]) 01/09/2017   Atrial fibrillation ([place]) [I48.91] 07/20/2016   Long term (current) use of anticoagulants [Z79.01] 07/20/2016   Foraminal stenosis of cervical region 05/31/2016   DOE (dyspnea on exertion)  05/14/2016   Chronic diastolic CHF (congestive heart failure) ([place]) 03/13/2016   Headache 01/25/2016   Carotid artery disease ([place]) 08/01/2015   Spinal stenosis in cervical region 07/30/2015   Occipital neuralgia of right side 07/15/2015   Cervicogenic headache 07/15/2015   Dizziness    Chest pain 05/29/2015   Medicare annual wellness visit, initial 05/11/2015   Sun-damaged skin 05/11/2015   Advance care planning 05/01/2015   Post-traumatic headache 02/18/2015   OA (osteoarthritis) of knee 01/07/2015   Recurrent pain of right knee 05/10/2014   Personal history of ongoing treatment with alendronate (Fosamax) 12/20/2013   History of blood transfusion 12/20/2013   Hyperlipidemia LDL goal <70 06/27/2013   HLD (hyperlipidemia) 06/27/2013   Essential hypertension 06/05/2013   Arthritis of hand, degenerative 06/05/2013   Acquired atrophy of thyroid 06/05/2013   Clinical depression 06/05/2013   Hypothyroidism due to acquired atrophy of thyroid 06/05/2013   Osteoarthritis of hand 06/05/2013   Arteriosclerosis of coronary artery 05/01/2013   Benign prostatic hyperplasia with urinary obstruction 05/01/2013   BPH with obstruction/lower urinary tract symptoms 05/01/2013   CAD (coronary artery disease) 05/01/2013   Abdominal pain, unspecified site 03/06/2013   Right shoulder pain 10/24/2012   Dermatitis 10/24/2012   Ejection fraction    Diabetes mellitus type II, uncontrolled 08/31/2011   Coronary artery disease involving native coronary artery    Anemia    OSA (obstructive sleep apnea)    Edema    Falling episodes    Hx of CABG    Palpitations    Shortness of breath    Insomnia 04/26/2011   RLS (restless legs syndrome) 09/19/2009   Depression with anxiety 07/09/2009   RBBB 07/09/2009  Low back pain potentially associated with radiculopathy 06/12/2009   SCIATICA 01/17/2009   GERD (gastroesophageal reflux disease) 01/09/2009   TUBULOVILLOUS ADENOMA, COLON, HX OF 01/08/2009    Presence of drug coated stent in right coronary artery 10/29/2008   NEPHROLITHIASIS, HX OF 12/20/2007   Past Medical History:  Diagnosis Date   Acquired atrophy of thyroid 06/05/2013   Overview:  July 2014: controlled on synthroid 7mcg since 2012 May 2015: decreased to Synthroid 50 mcg  Last Assessment & Plan:  Pt doing well with most recent TSH within normal range.  Will continue current dosage of Synthroid 1mcg. Pt reminded to take medication on empty stomach. Will continue to monitor with periodic laboratory assessment in 3 months.    Anemia    hemoglobin 7.4, iron deficiency, January, 2011, 2 unit transfusion, endoscopy normal, capsule endoscopy February, 2011 no small bowel abnormalities.   Most likely source gastric erosions, followed by GI   Anxiety    Bradycardia    CAD (coronary artery disease)    A. CABG in 2000,status post cardiac cath in 2006, 2009 ....continued chest pain and SOB despite oral medication adjestments including Ranexa. B. Cath November 2009/ mRCA - 2.75 x 23 Abbott Xience V drug-eluting stent ...11/26/2008 to distal  RCA leading to acute marginal.  C. Cath 07/2012 for CP - stable anatomy, med rx. d. cath 2015 and 05/30/2015 stable anatomy, consider Myoview if has CP again   Carotid artery disease ([place]) 08/01/2015   Doppler, May 29, 2015, 1-39% bilateral ICA    Cerebral ischemia    MRI November, 2010, chronic microvascular ischemia   CKD (chronic kidney disease), stage III ([place])    Concussion    Depression    Bipolar   Diabetes mellitus ([place])    Edema    Essential hypertension 06/05/2013   Overview:  July 2014: Controlled with Bystolic Sep 3716: Changed to atenolol.  Last Assessment & Plan:  Will change to atenolol for cost and follow Improved.  Medication compliance strongly encouraged BP: 116/64 mmHg    Overview:  July 2014: Controlled with Bystolic Sep 9678: Changed to atenolol.  Last Assessment & Plan:  Will change to atenolol for cost and follow   Falling  episodes    these have occurred in the past and again recurring 2011   Family history of adverse reaction to anesthesia    "mother died during bypass surgery but not sure if it has to do with anesthesia"   Gastric ulcer    GERD (gastroesophageal reflux disease)    H/O medication noncompliance    Due to loss of insurance   H/O multiple concussions    Hard of hearing    Heart murmur    History of blood transfusion 12/20/2013   History of kidney stones    Hx of CABG    2000,  / one median sternotomy suture broken her chest x-ray November, 2010, no clinical significance   Hyperlipidemia    Hypertension    pt. denies   Iron deficiency anemia    Long term (current) use of anticoagulants [Z79.01] 07/20/2016   Low back pain 06/12/2009   Qualifier: Diagnosis of  By: [name]    Nephrolithiasis    Orthostasis    OSA (obstructive sleep apnea)    PAF (paroxysmal atrial fibrillation) ([place])    a. dx 2017, started on amiodarone/warfarin but patient intermittently noncompliant.   PSVT (paroxysmal supraventricular tachycardia) ([place])    RBBB 07/09/2009   Qualifier: Diagnosis of  By: [name], MD, [facility], [name]    RLS (restless legs syndrome) 09/19/2009   Qualifier: Diagnosis of  By: [name]   Overview:  July 2014: Controlled with Mirapex  Last Assessment & Plan:  Patient is doing well. Will continue current management and follow clinically.   Seizure disorder ([place]) 01/09/2017   Spondylosis    C5-6, C6-7 MRI 2010   Syncope 03/2016   TBI (traumatic brain injury) 2019   Thyroid disease    Tubulovillous adenoma of colon 2007   Vitamin D deficiency 05/17/2017   Wears glasses     Past Surgical History:  Procedure Laterality Date   ANTERIOR CERVICAL DECOMP/DISCECTOMY FUSION N/A 05/31/2016   Procedure: ANTERIOR CERVICAL DECOMPRESSION/DISCECTOMY FUSION CERVICAL FIVE-SIX,CERVICAL SIX-SEVEN;  Surgeon: [name], MD;  Location: [place] [facility];  Service: Neurosurgery;  Laterality:  N/A;   CARDIAC CATHETERIZATION N/A 05/30/2015   Procedure: Left Heart Cath and Coronary Angiography;  Surgeon: [name], MD;  Location: [place] [facility];  Service: Cardiovascular;  Laterality: N/A;   COLONOSCOPY     CORONARY ARTERY BYPASS GRAFT     2000   ESOPHAGOGASTRODUODENOSCOPY     LEFT HEART CATH AND CORS/GRAFTS ANGIOGRAPHY N/A 12/15/2017   Procedure: LEFT HEART CATH AND CORS/GRAFTS ANGIOGRAPHY;  Surgeon: [name], MD;  Location: [place] [facility];  Service: Cardiovascular;  Laterality: N/A;   LEFT HEART CATHETERIZATION WITH CORONARY/GRAFT ANGIOGRAM N/A 08/01/2012   Procedure: LEFT HEART CATHETERIZATION WITH Beatrix Fetters;  Surgeon: [name], MD;  Location: [facility] C[facility] LAB;  Service: Cardiovascular;  Laterality: N/A;   LEFT HEART CATHETERIZATION WITH CORONARY/GRAFT ANGIOGRAM N/A 01/03/2015   Procedure: LEFT HEART CATHETERIZATION WITH Beatrix Fetters;  Surgeon: [name], MD;  Location: [facility] CATH LAB;  Service: Cardiovascular;  Laterality: N/A;   NASAL SEPTUM SURGERY     UP3   PERCUTANEOUS CORONARY STENT INTERVENTION (PCI-S)  10/2008   mRCA PCI  2.75 x 23 Abbott Xience V drug-eluting stent    RIGHT/LEFT HEART CATH AND CORONARY/GRAFT ANGIOGRAPHY N/A 01/07/2020   Procedure: RIGHT/LEFT HEART CATH AND CORONARY/GRAFT ANGIOGRAPHY;  Surgeon: [name], MD;  Location: [place] [facility];  Service: Cardiovascular;  Laterality: N/A;   ULTRASOUND GUIDANCE FOR VASCULAR ACCESS  12/15/2017   Procedure: Ultrasound Guidance For Vascular Access;  Surgeon: [name], MD;  Location: [place] [facility];  Service: Cardiovascular;;    No current facility-administered medications for this encounter.   Current Outpatient Medications  Medication Sig Dispense Refill Last Dose   albuterol (VENTOLIN HFA) 108 (90 Base) MCG/ACT inhaler Inhale 2 puffs into the lungs every 6 (six) hours as needed for wheezing or shortness of breath. 18 g 2    amLODipine  (NORVASC) 5 MG tablet TAKE 1 TABLET BY MOUTH ONCE DAILY 90 tablet 2    apixaban (ELIQUIS) 5 MG TABS tablet Take 1 tablet (5 mg total) by mouth 2 (two) times daily. 60 tablet 5    aspirin EC 81 MG tablet Take 81 mg by mouth daily. Swallow whole.      atorvastatin (LIPITOR) 80 MG tablet Take 1 tablet (80 mg total) by mouth daily. (Patient taking differently: Take 80 mg by mouth at bedtime.) 90 tablet 3    busPIRone (BUSPAR) 7.5 MG tablet Take 1 tablet (7.5 mg total) by mouth 2 (two) times daily. 60 tablet 3    cetirizine (ZYRTEC) 10 MG tablet TAKE 1 TABLET (10 MG TOTAL) BY MOUTH DAILY. 100 tablet 2  Diclofenac Sodium (PENNSAID) 2 % SOLN Place 1 application onto the skin 2 (two) times daily. (Patient taking differently: Place 1 application onto the skin 2 (two) times daily as needed (pain).) 112 g 2    divalproex (DEPAKOTE) 500 MG DR tablet Take 1 tablet (500 mg total) by mouth 2 (two) times daily. 60 tablet 5    famotidine (PEPCID) 20 MG tablet TAKE 1 TABLET BY MOUTH 2 TIMES DAILY 60 tablet 2    fenofibrate (TRICOR) 145 MG tablet TAKE 1 TABLET (145 MG TOTAL) BY MOUTH DAILY. 90 tablet 1    furosemide (LASIX) 20 MG tablet TAKE 1 TABLET BY MOUTH DAILY AS NEEDED FOR EDEMA 30 tablet 3    hydrocortisone 2.5 % cream Apply on to the skin 2 (two) times daily. (Patient taking differently: Apply 1 application topically 2 (two) times daily as needed (itching).) 30 g 1    hydrOXYzine (ATARAX/VISTARIL) 10 MG tablet Take 1 tablet (10 mg total) by mouth 3 (three) times daily as needed for anxiety or itching. 30 tablet 1    insulin glargine, 2 Unit Dial, (TOUJEO MAX SOLOSTAR) 300 UNIT/ML Solostar Pen Inject 130 Units into the skin every morning. 48 mL 3    isosorbide mononitrate (IMDUR) 120 MG 24 hr tablet TAKE 1 TABLET (120 MG TOTAL) BY MOUTH DAILY. 90 tablet 3    levothyroxine (SYNTHROID) 100 MCG tablet TAKE 1 TABLET (100 MCG TOTAL) BY MOUTH DAILY. 90 tablet 1    meclizine (ANTIVERT) 25 MG tablet Take 1 tablet (25  mg total) by mouth 3 (three) times daily as needed for dizziness. 30 tablet 0    metoprolol succinate (TOPROL-XL) 50 MG 24 hr tablet TAKE 1 AND 1/2 TABLETS EVERY DAY FOR A TOTAL OF 75 MG 135 tablet 3    nitroGLYCERIN (NITROSTAT) 0.4 MG SL tablet PLACE 1 TABLET UNDER THE TONGUE EVERY 5 MINUTES AS NEEDED FOR CHEST PAIN. 25 tablet 1    pantoprazole (PROTONIX) 40 MG tablet TAKE 1 TABLET BY MOUTH EVERY DAY 90 tablet 3    pramipexole (MIRAPEX) 1 MG tablet TAKE 1/2 TO 1 TABLET AT BEDTIME (Patient taking differently: Take 0.5-1 mg by mouth at bedtime.) 90 tablet 0    ranolazine (RANEXA) 500 MG 12 hr tablet TAKE 1 TABLET BY MOUTH TWICE DAILY 60 tablet 5    sertraline (ZOLOFT) 100 MG tablet TAKE 1 TABLET BY MOUTH TWICE DAILY 180 tablet 1    tiZANidine (ZANAFLEX) 4 MG tablet TAKE 1 TABLET BY MOUTH EVERY 6 HOURS AS NEEDED FOR MUSCLE SPASMS 40 tablet 2    traMADol (ULTRAM) 50 MG tablet Take 1 tablet (50 mg total) by mouth every 12 (twelve) hours as needed. May take 1 tablet by mouth every 8 hours, maximum 6 tabs per day. 15 tablet 0    triamcinolone cream (KENALOG) 0.1 % Apply 1 application topically 2 (two) times daily. Use as needed for itchy rash (Patient taking differently: Apply 1 application topically 2 (two) times daily as needed (itchy rash). Use as needed for itchy rash) 30 g 1    Vitamin D, Ergocalciferol, (DRISDOL) 1.25 MG (50000 UNIT) CAPS capsule TAKE 1 CAPSULE BY MOUTH EVERY 7 DAYS (Patient taking differently: Take 50,000 Units by mouth every Friday.) 4 capsule 4    Continuous Blood Gluc Sensor (FREESTYLE LIBRE 14 DAY SENSOR) MISC USE TO CHECK BLOOD SUGAR AND CHANGE EVERY 14 DAYS 6 each 3    glucose blood (ACCU-CHEK AVIVA) test strip Use as directed to check blood sugar  4 times daily.  DX E11.9 (Patient not taking: Reported on 09/16/2021) 200 each 6 Not Taking   Insulin Pen Needle 31G X 8 MM MISC use to inject insulin twice a day 100 each 3    Insulin Syringe-Needle U-100 31G X 5/16" 1 ML MISC USE AS  DIRECTED WITH NOVOLIN 100 each 0    triamcinolone cream (KENALOG) 0.5 % Apply 1 application on the skin twice a day (Patient not taking: Reported on 09/16/2021) 15 g 0 Not Taking   Allergies  Allergen Reactions   Morphine Other (See Comments)    Hallucinations   Shellfish-Derived Products Itching    Social History   Tobacco Use   Smoking status: Never   Smokeless tobacco: Never  Substance Use Topics   Alcohol use: No    Alcohol/week: 0.0 standard drinks    Comment: stopped drinking in 1998    Family History  Problem Relation Age of Onset   Pancreatic cancer Brother    Diabetes Brother    Coronary artery disease Brother    Stroke Brother    Diabetes Brother    Diabetes Mother    Heart failure Mother    Heart failure Father    Hypothyroidism Brother    Coronary artery disease Brother    Other Brother        colon surgery   Heart attack Other        Nephew   Irregular heart beat Daughter    Cancer Maternal Grandmother        unknown    Colon cancer Neg Hx    Stomach cancer Neg Hx    Liver cancer Neg Hx    Rectal cancer Neg Hx    Esophageal cancer Neg Hx      Review of Systems  Musculoskeletal:  Positive for arthralgias.       Right knee  All other systems reviewed and are negative.  Objective:  Physical Exam Constitutional:      Appearance: Normal appearance.  HENT:     Head: Normocephalic and atraumatic.     Nose: Nose normal.     Mouth/Throat:     Pharynx: Oropharynx is clear.  Eyes:     Extraocular Movements: Extraocular movements intact.  Cardiovascular:     Rate and Rhythm: Normal rate.     Pulses: Normal pulses.  Pulmonary:     Effort: Pulmonary effort is normal.  Abdominal:     Palpations: Abdomen is soft.  Musculoskeletal:     Cervical back: Normal range of motion.     Comments: Right knee motion is about 0-100.  He has medial joint line pain and crepitation.  There is no effusion and he has no scars.  Hip motion is full and straight leg  raise is negative.  Opposite knee moves a bit better.  He walks with an altered gait.   Skin:    General: Skin is warm and dry.  Neurological:     General: No focal deficit present.     Mental Status: He is alert and oriented to person, place, and time.  Psychiatric:        Mood and Affect: Mood normal.        Behavior: Behavior normal.        Thought Content: Thought content normal.        Judgment: Judgment normal.    Vital signs in last 24 hours:    Labs:   Estimated body mass index is 30.13 kg/m as calculated  from the following:   Height as of 09/18/21: 5\' 9"  (1.753 m).   Weight as of 09/18/21: 92.5 kg.   Imaging Review Plain radiographs demonstrate severe degenerative joint disease of the right knee(s). The overall alignment isneutral. The bone quality appears to be good for age and reported activity level.      Assessment/Plan:  End stage primary arthritis, right knee   The patient history, physical examination, clinical judgment of the provider and imaging studies are consistent with end stage degenerative joint disease of the right knee(s) and total knee arthroplasty is deemed medically necessary. The treatment options including medical management, injection therapy arthroscopy and arthroplasty were discussed at length. The risks and benefits of total knee arthroplasty were presented and reviewed. The risks due to aseptic loosening, infection, stiffness, patella tracking problems, thromboembolic complications and other imponderables were discussed. The patient acknowledged the explanation, agreed to proceed with the plan and consent was signed. Patient is being admitted for inpatient treatment for surgery, pain control, PT, OT, prophylactic antibiotics, VTE prophylaxis, progressive ambulation and ADL's and discharge planning. The patient is planning to be discharged home with home health services   Patient's anticipated LOS is less than 2 midnights, meeting these  requirements: - Younger than 38 - Lives within 1 hour of care - Has a competent adult at home to recover with post-op recover - NO history of  - Chronic pain requiring opiods  - Diabetes  - Coronary Artery Disease  - Heart failure  - Heart attack  - Stroke  - DVT/VTE  - Cardiac arrhythmia  - Respiratory Failure/COPD  - Renal failure  - Anemia  - Advanced Liver disease

## 2021-09-22 ENCOUNTER — Ambulatory Visit (HOSPITAL_COMMUNITY): Payer: Medicare Other | Admitting: Anesthesiology

## 2021-09-22 ENCOUNTER — Ambulatory Visit (HOSPITAL_COMMUNITY): Payer: Medicare Other | Admitting: Physician Assistant

## 2021-09-22 ENCOUNTER — Encounter (HOSPITAL_COMMUNITY): Admission: RE | Payer: Self-pay | Source: Home / Self Care

## 2021-09-22 ENCOUNTER — Encounter (HOSPITAL_COMMUNITY): Payer: Self-pay | Admitting: Orthopaedic Surgery

## 2021-09-22 ENCOUNTER — Other Ambulatory Visit: Payer: Self-pay

## 2021-09-22 ENCOUNTER — Other Ambulatory Visit (HOSPITAL_BASED_OUTPATIENT_CLINIC_OR_DEPARTMENT_OTHER): Payer: Self-pay

## 2021-09-22 ENCOUNTER — Observation Stay (HOSPITAL_COMMUNITY)
Admission: RE | Admit: 2021-09-22 | Discharge: 2021-09-23 | Disposition: A | Discharge status: Home / Self Care | Payer: Medicare Other | Attending: Orthopaedic Surgery | Admitting: Orthopaedic Surgery

## 2021-09-22 ENCOUNTER — Encounter (HOSPITAL_COMMUNITY)
Admission: RE | Disposition: A | Discharge status: Home / Self Care | Payer: Self-pay | Source: Home / Self Care | Attending: Orthopaedic Surgery

## 2021-09-22 DIAGNOSIS — E119 Type 2 diabetes mellitus without complications: Secondary | ICD-10-CM | POA: Insufficient documentation

## 2021-09-22 DIAGNOSIS — M1711 Unilateral primary osteoarthritis, right knee: Secondary | ICD-10-CM | POA: Diagnosis present

## 2021-09-22 DIAGNOSIS — Z79899 Other long term (current) drug therapy: Secondary | ICD-10-CM | POA: Insufficient documentation

## 2021-09-22 DIAGNOSIS — N185 Chronic kidney disease, stage 5: Secondary | ICD-10-CM | POA: Diagnosis not present

## 2021-09-22 DIAGNOSIS — I132 Hypertensive heart and chronic kidney disease with heart failure and with stage 5 chronic kidney disease, or end stage renal disease: Secondary | ICD-10-CM | POA: Insufficient documentation

## 2021-09-22 DIAGNOSIS — I2510 Atherosclerotic heart disease of native coronary artery without angina pectoris: Secondary | ICD-10-CM | POA: Diagnosis not present

## 2021-09-22 DIAGNOSIS — Z794 Long term (current) use of insulin: Secondary | ICD-10-CM | POA: Diagnosis not present

## 2021-09-22 DIAGNOSIS — I5032 Chronic diastolic (congestive) heart failure: Secondary | ICD-10-CM | POA: Diagnosis not present

## 2021-09-22 DIAGNOSIS — Z7901 Long term (current) use of anticoagulants: Secondary | ICD-10-CM | POA: Diagnosis not present

## 2021-09-22 HISTORY — PX: TOTAL KNEE ARTHROPLASTY: SHX125

## 2021-09-22 LAB — GLUCOSE, CAPILLARY
Glucose-Capillary: 57 mg/dL — ABNORMAL LOW (ref 70–99)
Glucose-Capillary: 66 mg/dL — ABNORMAL LOW (ref 70–99)
Glucose-Capillary: 99 mg/dL (ref 70–99)
Glucose-Capillary: 117 mg/dL — ABNORMAL HIGH (ref 70–99)

## 2021-09-22 SURGERY — ARTHROPLASTY, KNEE, TOTAL
Anesthesia: Spinal | Site: Knee | Laterality: Right

## 2021-09-22 MED ORDER — LACTATED RINGERS IV SOLN: INTRAVENOUS | Status: DC

## 2021-09-22 MED ORDER — BUPIVACAINE LIPOSOME 1.3 % IJ SUSP
INTRAMUSCULAR | Status: DC | PRN
  Administered 2021-09-22: 20 mL

## 2021-09-22 MED ORDER — INSULIN GLARGINE (2 UNIT DIAL) 300 UNIT/ML ~~LOC~~ SOPN: 130.0000 [IU] | PEN_INJECTOR | SUBCUTANEOUS | Status: DC

## 2021-09-22 MED ORDER — OXYCODONE HCL 5 MG PO TABS
10.0000 mg | ORAL_TABLET | ORAL | Status: DC | PRN
  Administered 2021-09-22: 10 mg via ORAL
  Filled 2021-09-22: qty 2

## 2021-09-22 MED ORDER — TRANEXAMIC ACID-NACL 1000-0.7 MG/100ML-% IV SOLN
INTRAVENOUS | Status: AC
  Filled 2021-09-22: qty 100

## 2021-09-22 MED ORDER — DIVALPROEX SODIUM 250 MG PO DR TAB
500.0000 mg | DELAYED_RELEASE_TABLET | Freq: Two times a day (BID) | ORAL | Status: DC
  Administered 2021-09-22 – 2021-09-23 (×2): 500 mg via ORAL
  Filled 2021-09-22: qty 2

## 2021-09-22 MED ORDER — HYDROCODONE-ACETAMINOPHEN 5-325 MG PO TABS
1.0000 | ORAL_TABLET | ORAL | Status: DC | PRN
  Administered 2021-09-22: 1 via ORAL

## 2021-09-22 MED ORDER — INSULIN ASPART 100 UNIT/ML IJ SOLN: 0.0000 [IU] | Freq: Three times a day (TID) | INTRAMUSCULAR | Status: DC

## 2021-09-22 MED ORDER — 0.9 % SODIUM CHLORIDE (POUR BTL) OPTIME
TOPICAL | Status: DC | PRN
  Administered 2021-09-22: 1000 mL

## 2021-09-22 MED ORDER — OXYCODONE HCL 5 MG PO TABS
5.0000 mg | ORAL_TABLET | ORAL | Status: DC | PRN
Start: 2021-09-22 — End: 2021-09-23
  Administered 2021-09-23: 5 mg via ORAL
  Filled 2021-09-22: qty 1

## 2021-09-22 MED ORDER — ONDANSETRON HCL 4 MG/2ML IJ SOLN: 4.0000 mg | Freq: Once | INTRAMUSCULAR | Status: DC | PRN

## 2021-09-22 MED ORDER — CEFAZOLIN SODIUM-DEXTROSE 2-4 GM/100ML-% IV SOLN
2.0000 g | Freq: Once | INTRAVENOUS | Status: AC
  Administered 2021-09-23: 2 g via INTRAVENOUS
  Filled 2021-09-22: qty 100

## 2021-09-22 MED ORDER — DEXTROSE 50 % IV SOLN
1.0000 | Freq: Once | INTRAVENOUS | Status: AC
  Administered 2021-09-22: 50 mL via INTRAVENOUS

## 2021-09-22 MED ORDER — HYDROCODONE-ACETAMINOPHEN 7.5-325 MG PO TABS
ORAL_TABLET | ORAL | Status: AC
  Administered 2021-09-22: 1 via ORAL
  Filled 2021-09-22: qty 1

## 2021-09-22 MED ORDER — BUPIVACAINE LIPOSOME 1.3 % IJ SUSP: 20.0000 mL | Freq: Once | INTRAMUSCULAR | Status: DC

## 2021-09-22 MED ORDER — LACTATED RINGERS IV BOLUS
250.0000 mL | Freq: Once | INTRAVENOUS | Status: AC
  Administered 2021-09-22: 250 mL via INTRAVENOUS

## 2021-09-22 MED ORDER — LEVOTHYROXINE SODIUM 100 MCG PO TABS
100.0000 ug | ORAL_TABLET | Freq: Every day | ORAL | Status: DC
  Administered 2021-09-23: 100 ug via ORAL
  Filled 2021-09-22: qty 1

## 2021-09-22 MED ORDER — FAMOTIDINE 20 MG PO TABS
20.0000 mg | ORAL_TABLET | Freq: Two times a day (BID) | ORAL | Status: DC
  Administered 2021-09-22 – 2021-09-23 (×2): 20 mg via ORAL
  Filled 2021-09-22 (×2): qty 1

## 2021-09-22 MED ORDER — BUPIVACAINE-EPINEPHRINE 0.5% -1:200000 IJ SOLN
INTRAMUSCULAR | Status: AC
  Filled 2021-09-22: qty 1

## 2021-09-22 MED ORDER — BISACODYL 5 MG PO TBEC: 5.0000 mg | DELAYED_RELEASE_TABLET | Freq: Every day | ORAL | Status: DC | PRN

## 2021-09-22 MED ORDER — FENTANYL CITRATE (PF) 250 MCG/5ML IJ SOLN
INTRAMUSCULAR | Status: DC | PRN
  Administered 2021-09-22: 100 ug via INTRAVENOUS

## 2021-09-22 MED ORDER — FENOFIBRATE 160 MG PO TABS
160.0000 mg | ORAL_TABLET | Freq: Every day | ORAL | Status: DC
  Administered 2021-09-23: 160 mg via ORAL
  Filled 2021-09-22: qty 1

## 2021-09-22 MED ORDER — STERILE WATER FOR IRRIGATION IR SOLN
Status: DC | PRN
  Administered 2021-09-22: 6000 mL

## 2021-09-22 MED ORDER — MENTHOL 3 MG MT LOZG: 1.0000 | LOZENGE | OROMUCOSAL | Status: DC | PRN

## 2021-09-22 MED ORDER — CEFAZOLIN SODIUM-DEXTROSE 2-4 GM/100ML-% IV SOLN
2.0000 g | Freq: Four times a day (QID) | INTRAVENOUS | Status: AC
  Administered 2021-09-22: 2 g via INTRAVENOUS
  Filled 2021-09-22: qty 100

## 2021-09-22 MED ORDER — BUPIVACAINE IN DEXTROSE 0.75-8.25 % IT SOLN
INTRATHECAL | Status: DC | PRN
  Administered 2021-09-22: 1.6 mL via INTRATHECAL

## 2021-09-22 MED ORDER — METOCLOPRAMIDE HCL 5 MG PO TABS: 5.0000 mg | ORAL_TABLET | Freq: Three times a day (TID) | ORAL | Status: DC | PRN

## 2021-09-22 MED ORDER — FENTANYL CITRATE (PF) 100 MCG/2ML IJ SOLN
INTRAMUSCULAR | Status: AC
  Filled 2021-09-22: qty 2

## 2021-09-22 MED ORDER — KETOROLAC TROMETHAMINE 15 MG/ML IJ SOLN
7.5000 mg | Freq: Four times a day (QID) | INTRAMUSCULAR | Status: AC
  Administered 2021-09-22 – 2021-09-23 (×4): 7.5 mg via INTRAVENOUS
  Filled 2021-09-22 (×3): qty 1

## 2021-09-22 MED ORDER — EPHEDRINE SULFATE-NACL 50-0.9 MG/10ML-% IV SOSY
PREFILLED_SYRINGE | INTRAVENOUS | Status: DC | PRN
  Administered 2021-09-22: 10 mg via INTRAVENOUS
  Administered 2021-09-22: 5 mg via INTRAVENOUS
  Administered 2021-09-22: 10 mg via INTRAVENOUS
  Administered 2021-09-22: 5 mg via INTRAVENOUS
  Administered 2021-09-22 (×2): 10 mg via INTRAVENOUS

## 2021-09-22 MED ORDER — MECLIZINE HCL 25 MG PO TABS
25.0000 mg | ORAL_TABLET | Freq: Three times a day (TID) | ORAL | Status: DC | PRN
  Administered 2021-09-23: 25 mg via ORAL
  Filled 2021-09-22: qty 1

## 2021-09-22 MED ORDER — BUPIVACAINE HCL (PF) 0.5 % IJ SOLN
INTRAMUSCULAR | Status: DC | PRN
  Administered 2021-09-22: 20 mL via PERINEURAL

## 2021-09-22 MED ORDER — AMLODIPINE BESYLATE 5 MG PO TABS
5.0000 mg | ORAL_TABLET | Freq: Every day | ORAL | Status: DC
  Administered 2021-09-23: 5 mg via ORAL
  Filled 2021-09-22: qty 1

## 2021-09-22 MED ORDER — CHLORHEXIDINE GLUCONATE 0.12 % MT SOLN: 15.0000 mL | Freq: Once | OROMUCOSAL | Status: AC

## 2021-09-22 MED ORDER — BUPIVACAINE-EPINEPHRINE 0.5% -1:200000 IJ SOLN
INTRAMUSCULAR | Status: DC | PRN
  Administered 2021-09-22: 30 mL

## 2021-09-22 MED ORDER — ASPIRIN EC 81 MG PO TBEC
81.0000 mg | DELAYED_RELEASE_TABLET | Freq: Every day | ORAL | Status: DC
  Administered 2021-09-23: 81 mg via ORAL
  Filled 2021-09-22: qty 1

## 2021-09-22 MED ORDER — METHOCARBAMOL 500 MG PO TABS
500.0000 mg | ORAL_TABLET | Freq: Four times a day (QID) | ORAL | Status: DC | PRN
  Administered 2021-09-22 – 2021-09-23 (×3): 500 mg via ORAL
  Filled 2021-09-22 (×3): qty 1

## 2021-09-22 MED ORDER — ACETAMINOPHEN 500 MG PO TABS
500.0000 mg | ORAL_TABLET | Freq: Four times a day (QID) | ORAL | Status: AC
  Administered 2021-09-22 – 2021-09-23 (×4): 500 mg via ORAL
  Filled 2021-09-22 (×4): qty 1

## 2021-09-22 MED ORDER — SODIUM CHLORIDE (PF) 0.9 % IJ SOLN
INTRAMUSCULAR | Status: DC | PRN
  Administered 2021-09-22: 30 mL

## 2021-09-22 MED ORDER — TRANEXAMIC ACID 1000 MG/10ML IV SOLN
INTRAVENOUS | Status: DC | PRN
  Administered 2021-09-22: 2000 mg via TOPICAL

## 2021-09-22 MED ORDER — LACTATED RINGERS IV BOLUS
500.0000 mL | Freq: Once | INTRAVENOUS | Status: AC
  Administered 2021-09-22: 500 mL via INTRAVENOUS

## 2021-09-22 MED ORDER — RANOLAZINE ER 500 MG PO TB12
500.0000 mg | ORAL_TABLET | Freq: Two times a day (BID) | ORAL | Status: DC
  Administered 2021-09-22 – 2021-09-23 (×2): 500 mg via ORAL
  Filled 2021-09-22 (×3): qty 1

## 2021-09-22 MED ORDER — ISOSORBIDE MONONITRATE ER 60 MG PO TB24
120.0000 mg | ORAL_TABLET | Freq: Every day | ORAL | Status: DC
  Administered 2021-09-23: 120 mg via ORAL
  Filled 2021-09-22: qty 2

## 2021-09-22 MED ORDER — HYDROCODONE-ACETAMINOPHEN 5-325 MG PO TABS
ORAL_TABLET | ORAL | Status: AC
  Administered 2021-09-22: 1 via ORAL
  Filled 2021-09-22: qty 1

## 2021-09-22 MED ORDER — METHOCARBAMOL 500 MG IVPB - SIMPLE MED
INTRAVENOUS | Status: AC
  Filled 2021-09-22: qty 50

## 2021-09-22 MED ORDER — PHENOL 1.4 % MT LIQD: 1.0000 | OROMUCOSAL | Status: DC | PRN

## 2021-09-22 MED ORDER — TRANEXAMIC ACID-NACL 1000-0.7 MG/100ML-% IV SOLN
1000.0000 mg | INTRAVENOUS | Status: AC
  Administered 2021-09-22: 1000 mg via INTRAVENOUS

## 2021-09-22 MED ORDER — ATORVASTATIN CALCIUM 40 MG PO TABS
80.0000 mg | ORAL_TABLET | Freq: Every day | ORAL | Status: DC
  Administered 2021-09-22: 80 mg via ORAL
  Filled 2021-09-22: qty 2

## 2021-09-22 MED ORDER — POVIDONE-IODINE 10 % EX SWAB: 2.0000 "application " | Freq: Once | CUTANEOUS | Status: DC

## 2021-09-22 MED ORDER — DEXTROSE 50 % IV SOLN
INTRAVENOUS | Status: AC
  Filled 2021-09-22: qty 50

## 2021-09-22 MED ORDER — ALBUTEROL SULFATE (2.5 MG/3ML) 0.083% IN NEBU: 2.5000 mg | INHALATION_SOLUTION | Freq: Four times a day (QID) | RESPIRATORY_TRACT | Status: DC | PRN

## 2021-09-22 MED ORDER — HYDROCODONE-ACETAMINOPHEN 7.5-325 MG PO TABS: 1.0000 | ORAL_TABLET | ORAL | Status: DC | PRN

## 2021-09-22 MED ORDER — ACETAMINOPHEN 325 MG PO TABS: 325.0000 mg | ORAL_TABLET | Freq: Four times a day (QID) | ORAL | Status: DC | PRN

## 2021-09-22 MED ORDER — METOPROLOL SUCCINATE ER 50 MG PO TB24
75.0000 mg | ORAL_TABLET | Freq: Every day | ORAL | Status: DC
  Administered 2021-09-23: 75 mg via ORAL
  Filled 2021-09-22: qty 1

## 2021-09-22 MED ORDER — PANTOPRAZOLE SODIUM 40 MG PO TBEC
40.0000 mg | DELAYED_RELEASE_TABLET | Freq: Every day | ORAL | Status: DC
  Administered 2021-09-23: 40 mg via ORAL
  Filled 2021-09-22: qty 1

## 2021-09-22 MED ORDER — ACETAMINOPHEN 10 MG/ML IV SOLN: 1000.0000 mg | Freq: Once | INTRAVENOUS | Status: DC | PRN

## 2021-09-22 MED ORDER — HYDROXYZINE HCL 10 MG PO TABS
10.0000 mg | ORAL_TABLET | Freq: Three times a day (TID) | ORAL | Status: DC | PRN
  Filled 2021-09-22: qty 1

## 2021-09-22 MED ORDER — BUSPIRONE HCL 5 MG PO TABS
7.5000 mg | ORAL_TABLET | Freq: Two times a day (BID) | ORAL | Status: DC
  Administered 2021-09-22 – 2021-09-23 (×2): 7.5 mg via ORAL
  Filled 2021-09-22 (×2): qty 2

## 2021-09-22 MED ORDER — PRAMIPEXOLE DIHYDROCHLORIDE 0.25 MG PO TABS
0.5000 mg | ORAL_TABLET | Freq: Every day | ORAL | Status: DC
  Administered 2021-09-22: 0.5 mg via ORAL
  Filled 2021-09-22: qty 2

## 2021-09-22 MED ORDER — INSULIN GLARGINE-YFGN 100 UNIT/ML ~~LOC~~ SOLN
130.0000 [IU] | Freq: Every day | SUBCUTANEOUS | Status: DC
  Filled 2021-09-22: qty 1.3

## 2021-09-22 MED ORDER — MIDAZOLAM HCL 2 MG/2ML IJ SOLN
INTRAMUSCULAR | Status: AC
  Filled 2021-09-22: qty 2

## 2021-09-22 MED ORDER — HYDROMORPHONE HCL 1 MG/ML IJ SOLN
0.2500 mg | INTRAMUSCULAR | Status: DC | PRN
  Administered 2021-09-22 (×2): 0.5 mg via INTRAVENOUS

## 2021-09-22 MED ORDER — METHOCARBAMOL 500 MG IVPB - SIMPLE MED
500.0000 mg | Freq: Four times a day (QID) | INTRAVENOUS | Status: DC | PRN
  Administered 2021-09-22: 500 mg via INTRAVENOUS
  Filled 2021-09-22: qty 50

## 2021-09-22 MED ORDER — KETOROLAC TROMETHAMINE 15 MG/ML IJ SOLN
INTRAMUSCULAR | Status: AC
  Filled 2021-09-22: qty 1

## 2021-09-22 MED ORDER — ONDANSETRON HCL 4 MG PO TABS
4.0000 mg | ORAL_TABLET | Freq: Four times a day (QID) | ORAL | Status: DC | PRN
  Filled 2021-09-22: qty 1

## 2021-09-22 MED ORDER — DOCUSATE SODIUM 100 MG PO CAPS
100.0000 mg | ORAL_CAPSULE | Freq: Two times a day (BID) | ORAL | Status: DC
  Administered 2021-09-22 – 2021-09-23 (×2): 100 mg via ORAL
  Filled 2021-09-22 (×2): qty 1

## 2021-09-22 MED ORDER — TRANEXAMIC ACID-NACL 1000-0.7 MG/100ML-% IV SOLN: 1000.0000 mg | Freq: Once | INTRAVENOUS | Status: DC

## 2021-09-22 MED ORDER — APIXABAN 5 MG PO TABS
5.0000 mg | ORAL_TABLET | Freq: Two times a day (BID) | ORAL | Status: DC
  Administered 2021-09-23: 5 mg via ORAL
  Filled 2021-09-22: qty 1

## 2021-09-22 MED ORDER — SERTRALINE HCL 100 MG PO TABS
100.0000 mg | ORAL_TABLET | Freq: Two times a day (BID) | ORAL | Status: DC
  Administered 2021-09-22 – 2021-09-23 (×2): 100 mg via ORAL
  Filled 2021-09-22 (×2): qty 1

## 2021-09-22 MED ORDER — LACTATED RINGERS IV BOLUS: 250.0000 mL | Freq: Once | INTRAVENOUS | Status: DC

## 2021-09-22 MED ORDER — HYDROCODONE-ACETAMINOPHEN 5-325 MG PO TABS
ORAL_TABLET | ORAL | Status: AC
  Filled 2021-09-22: qty 1

## 2021-09-22 MED ORDER — HYDROMORPHONE HCL 1 MG/ML IJ SOLN
INTRAMUSCULAR | Status: AC
  Filled 2021-09-22: qty 1

## 2021-09-22 MED ORDER — PROPOFOL 500 MG/50ML IV EMUL
INTRAVENOUS | Status: DC | PRN
  Administered 2021-09-22: 100 ug/kg/min via INTRAVENOUS

## 2021-09-22 MED ORDER — CEFAZOLIN SODIUM-DEXTROSE 2-4 GM/100ML-% IV SOLN
2.0000 g | INTRAVENOUS | Status: AC
  Administered 2021-09-22: 2 g via INTRAVENOUS

## 2021-09-22 MED ORDER — ALUM & MAG HYDROXIDE-SIMETH 200-200-20 MG/5ML PO SUSP: 30.0000 mL | ORAL | Status: DC | PRN

## 2021-09-22 MED ORDER — CEFAZOLIN SODIUM-DEXTROSE 2-4 GM/100ML-% IV SOLN
INTRAVENOUS | Status: AC
  Filled 2021-09-22: qty 100

## 2021-09-22 MED ORDER — BUPIVACAINE LIPOSOME 1.3 % IJ SUSP
INTRAMUSCULAR | Status: AC
  Filled 2021-09-22: qty 20

## 2021-09-22 MED ORDER — METOCLOPRAMIDE HCL 5 MG/ML IJ SOLN: 5.0000 mg | Freq: Three times a day (TID) | INTRAMUSCULAR | Status: DC | PRN

## 2021-09-22 MED ORDER — FUROSEMIDE 20 MG PO TABS: 20.0000 mg | ORAL_TABLET | Freq: Every day | ORAL | Status: DC | PRN

## 2021-09-22 MED ORDER — HYDROCODONE-ACETAMINOPHEN 5-325 MG PO TABS
1.0000 | ORAL_TABLET | Freq: Four times a day (QID) | ORAL | 0 refills | Status: DC | PRN
  Filled 2021-09-22: qty 40, 5d supply, fill #0

## 2021-09-22 MED ORDER — SODIUM CHLORIDE 0.9 % IR SOLN
Status: DC | PRN
  Administered 2021-09-22: 3000 mL

## 2021-09-22 MED ORDER — ONDANSETRON HCL 4 MG/2ML IJ SOLN: 4.0000 mg | Freq: Four times a day (QID) | INTRAMUSCULAR | Status: DC | PRN

## 2021-09-22 MED ORDER — EPHEDRINE 5 MG/ML INJ
INTRAVENOUS | Status: AC
  Filled 2021-09-22: qty 10

## 2021-09-22 MED ORDER — ORAL CARE MOUTH RINSE
15.0000 mL | Freq: Once | OROMUCOSAL | Status: AC
  Administered 2021-09-22: 15 mL via OROMUCOSAL

## 2021-09-22 MED ORDER — DIPHENHYDRAMINE HCL 12.5 MG/5ML PO ELIX: 12.5000 mg | ORAL_SOLUTION | ORAL | Status: DC | PRN

## 2021-09-22 MED ORDER — TRANEXAMIC ACID 1000 MG/10ML IV SOLN
2000.0000 mg | INTRAVENOUS | Status: DC
  Filled 2021-09-22: qty 20

## 2021-09-22 SURGICAL SUPPLY — 57 items
ATTUNE MED DOME PAT 38 KNEE (Knees) ×1 IMPLANT
ATTUNE PS FEM RT SZ 6 CEM KNEE (Femur) ×1 IMPLANT
ATTUNE PSRP INSR SZ6 7 KNEE (Insert) ×1 IMPLANT
BAG COUNTER SPONGE SURGICOUNT (BAG) ×1 IMPLANT
BAG DECANTER FOR FLEXI CONT (MISCELLANEOUS) ×3 IMPLANT
BAG SPEC THK2 15X12 ZIP CLS (MISCELLANEOUS) ×1
BAG SPNG CNTER NS LX DISP (BAG)
BAG ZIPLOCK 12X15 (MISCELLANEOUS) ×2 IMPLANT
BASE TIBIAL ROT PLAT SZ 8 KNEE (Knees) IMPLANT
BLADE SAGITTAL 25.0X1.19X90 (BLADE) ×2 IMPLANT
BLADE SAW SGTL 11.0X1.19X90.0M (BLADE) ×2 IMPLANT
BLADE SURG SZ10 CARB STEEL (BLADE) ×2 IMPLANT
BNDG ELASTIC 3X5.8 VLCR STR LF (GAUZE/BANDAGES/DRESSINGS) ×1 IMPLANT
BNDG ELASTIC 6X5.8 VLCR STR LF (GAUZE/BANDAGES/DRESSINGS) ×2 IMPLANT
BOOTIES KNEE HIGH SLOAN (MISCELLANEOUS) ×2 IMPLANT
BOWL SMART MIX CTS (DISPOSABLE) ×2 IMPLANT
BSPLAT TIB 8 CMNT ROT PLAT STR (Knees) ×1 IMPLANT
CEMENT HV SMART SET (Cement) ×4 IMPLANT
COVER SURGICAL LIGHT HANDLE (MISCELLANEOUS) ×2 IMPLANT
CUFF TOURN SGL QUICK 34 (TOURNIQUET CUFF) ×2
CUFF TRNQT CYL 34X4.125X (TOURNIQUET CUFF) ×1 IMPLANT
DECANTER SPIKE VIAL GLASS SM (MISCELLANEOUS) ×3 IMPLANT
DRAPE INCISE IOBAN 66X45 STRL (DRAPES) ×2 IMPLANT
DRAPE ORTHO SPLIT 77X108 STRL (DRAPES) ×2
DRAPE SHEET LG 3/4 BI-LAMINATE (DRAPES) ×2 IMPLANT
DRAPE SURG ORHT 6 SPLT 77X108 (DRAPES) IMPLANT
DRAPE U-SHAPE 47X51 STRL (DRAPES) ×2 IMPLANT
DRSG AQUACEL AG ADV 3.5X10 (GAUZE/BANDAGES/DRESSINGS) ×2 IMPLANT
DURAPREP 26ML APPLICATOR (WOUND CARE) ×4 IMPLANT
ELECT REM PT RETURN 15FT ADLT (MISCELLANEOUS) ×2 IMPLANT
GLOVE SRG 8 PF TXTR STRL LF DI (GLOVE) ×2 IMPLANT
GLOVE SURG ENC MOIS LTX SZ8 (GLOVE) ×4 IMPLANT
GLOVE SURG UNDER POLY LF SZ8 (GLOVE) ×4
GOWN STRL REUS W/TWL XL LVL3 (GOWN DISPOSABLE) ×4 IMPLANT
HANDPIECE INTERPULSE COAX TIP (DISPOSABLE) ×2
HOLDER FOLEY CATH W/STRAP (MISCELLANEOUS) IMPLANT
HOOD PEEL AWAY FLYTE STAYCOOL (MISCELLANEOUS) ×6 IMPLANT
IV NS IRRIG 3000ML ARTHROMATIC (IV SOLUTION) ×1 IMPLANT
MANIFOLD NEPTUNE II (INSTRUMENTS) ×2 IMPLANT
NEEDLE HYPO 22GX1.5 SAFETY (NEEDLE) ×2 IMPLANT
NS IRRIG 1000ML POUR BTL (IV SOLUTION) ×2 IMPLANT
PACK TOTAL KNEE CUSTOM (KITS) ×2 IMPLANT
PAD ARMBOARD 7.5X6 YLW CONV (MISCELLANEOUS) ×3 IMPLANT
PROTECTOR NERVE ULNAR (MISCELLANEOUS) ×1 IMPLANT
SET HNDPC FAN SPRY TIP SCT (DISPOSABLE) ×1 IMPLANT
SUT ETHIBOND NAB CT1 #1 30IN (SUTURE) ×4 IMPLANT
SUT VIC AB 0 CT1 36 (SUTURE) ×2 IMPLANT
SUT VIC AB 2-0 CT1 27 (SUTURE) ×2
SUT VIC AB 2-0 CT1 TAPERPNT 27 (SUTURE) ×1 IMPLANT
SUT VICRYL AB 3-0 FS1 BRD 27IN (SUTURE) ×2 IMPLANT
SUT VLOC 180 0 24IN GS25 (SUTURE) ×2 IMPLANT
SYR 30ML LL (SYRINGE) ×1 IMPLANT
TIBIAL BASE ROT PLAT SZ 8 KNEE (Knees) ×2 IMPLANT
TRAY FOLEY MTR SLVR 16FR STAT (SET/KITS/TRAYS/PACK) ×1 IMPLANT
WATER STERILE IRR 1000ML POUR (IV SOLUTION) ×3 IMPLANT
WRAP KNEE MAXI GEL POST OP (GAUZE/BANDAGES/DRESSINGS) ×2 IMPLANT
YANKAUER SUCT BULB TIP NO VENT (SUCTIONS) ×2 IMPLANT

## 2021-09-22 NOTE — Anesthesia Procedure Notes (Signed)
Spinal  Patient location during procedure: OR Start time: 09/22/2021 7:39 AM End time: 09/22/2021 7:44 AM Reason for block: surgical anesthesia Staffing Performed: anesthesiologist  Anesthesiologist: Myrtie Soman, MD Preanesthetic Checklist Completed: patient identified, IV checked, site marked, risks and benefits discussed, surgical consent, monitors and equipment checked, pre-op evaluation and timeout performed Spinal Block Patient position: sitting Prep: Betadine Patient monitoring: heart rate, continuous pulse ox and blood pressure Approach: midline Location: L3-4 Injection technique: single-shot Needle Needle type: Sprotte  Needle gauge: 24 G Needle length: 9 cm Assessment Sensory level: T6 Events: CSF return Additional Notes

## 2021-09-22 NOTE — Progress Notes (Signed)
Pt CBG 57 on arrival. Pt denies any symptoms. Educated to make nurse aware if he begins feeling bad or begins to have symptoms. Will make Anesthesia aware when they get here.

## 2021-09-22 NOTE — Op Note (Signed)
PREOP DIAGNOSIS: DJD RIGHT KNEE POSTOP DIAGNOSIS: same PROCEDURE: RIGHT TKR ANESTHESIA: Spinal and MAC ATTENDING SURGEON: Hessie Dibble ASSISTANT: Loni Dolly PA  INDICATIONS FOR PROCEDURE: IPhilip A [name] is a 75 y.o. male who has struggled for a long time with pain due to degenerative arthritis of the right knee.  The patient has failed many conservative non-operative measures and at this point has pain which limits the ability to sleep and walk.  The patient is offered total knee replacement.  Informed operative consent was obtained after discussion of possible risks of anesthesia, infection, neurovascular injury, DVT, and death.  The importance of the post-operative rehabilitation protocol to optimize result was stressed extensively with the patient.  SUMMARY OF FINDINGS AND PROCEDURE:  [name] [name] was taken to the operative suite where under the above anesthesia a right knee replacement was performed.  There were advanced degenerative changes and the bone quality was excellent.  We used the DePuy Attune system and placed size 6 femur, 8 tibia, 38 mm all polyethylene patella, and a size 7 mm spacer.  Loni Dolly PA-C assisted throughout and was invaluable to the completion of the case in that he helped retract and maintain exposure while I placed components.  He also helped close thereby minimizing OR time.  The patient was admitted for appropriate post-op care to include perioperative antibiotics and mechanical and pharmacologic measures for DVT prophylaxis.  DESCRIPTION OF PROCEDURE:  [name] [name] was taken to the operative suite where the above anesthesia was applied.  The patient was positioned supine and prepped and draped in normal sterile fashion.  An appropriate time out was performed.  After the administration of kefzol pre-op antibiotic the leg was elevated and exsanguinated and a tourniquet inflated. A standard longitudinal incision was made on the anterior knee.   Dissection was carried down to the extensor mechanism.  All appropriate anti-infective measures were used including the pre-operative antibiotic, betadine impregnated drape, and closed hooded exhaust systems for each member of the surgical team.  A medial parapatellar incision was made in the extensor mechanism and the knee cap flipped and the knee flexed.  Some residual meniscal tissues were removed along with any remaining ACL/PCL tissue.  A guide was placed on the tibia and a flat cut was made on it's superior surface.  An intramedullary guide was placed in the femur and was utilized to make anterior and posterior cuts creating an appropriate flexion gap.  A second intramedullary guide was placed in the femur to make a distal cut properly balancing the knee with an extension gap equal to the flexion gap.  The three bones sized to the above mentioned sizes and the appropriate guides were placed and utilized.  A trial reduction was done and the knee easily came to full extension and the patella tracked well on flexion.  The trial components were removed and all bones were cleaned with pulsatile lavage and then dried thoroughly.  Cement was mixed and was pressurized onto the bones followed by placement of the aforementioned components.  Excess cement was trimmed and pressure was held on the components until the cement had hardened.  The tourniquet was deflated and a small amount of bleeding was controlled with cautery and pressure.  The knee was irrigated thoroughly.  The extensor mechanism was re-approximated with #1 ethibond in interrupted fashion.  The knee was flexed and the repair was solid.  The subcutaneous tissues were re-approximated with #0 and #2-0 vicryl and the skin closed with  a subcuticular stitch and steristrips.  A sterile dressing was applied.  Intraoperative fluids, EBL, and tourniquet time can be obtained from anesthesia records.  DISPOSITION:  The patient was taken to recovery room in stable  condition and admitted for appropriate post-op care to include peri-operative antibiotic and DVT prophylaxis with mechanical and pharmacologic measures.  [name] [name] 09/22/2021, 9:07 AM

## 2021-09-22 NOTE — Anesthesia Procedure Notes (Signed)
Procedure Name: MAC Date/Time: 09/22/2021 7:45 AM Performed by: Lieutenant Diego, CRNA Pre-anesthesia Checklist: Patient identified, Emergency Drugs available, Suction available, Patient being monitored and Timeout performed Patient Re-evaluated:Patient Re-evaluated prior to induction Oxygen Delivery Method: Simple face mask Preoxygenation: Pre-oxygenation with 100% oxygen Induction Type: IV induction

## 2021-09-22 NOTE — Interval H&P Note (Signed)
History and Physical Interval Note:  09/22/2021 7:34 AM  [name] [name]  has presented today for surgery, with the diagnosis of RIGHT KNEE DEGENERATIVE JOINT DISEASE.  The various methods of treatment have been discussed with the patient and family. After consideration of risks, benefits and other options for treatment, the patient has consented to  Procedure(s): RIGHT TOTAL KNEE ARTHROPLASTY (Right) as a surgical intervention.  The patient's history has been reviewed, patient examined, no change in status, stable for surgery.  I have reviewed the patient's chart and labs.  Questions were answered to the patient's satisfaction.     Hessie Dibble

## 2021-09-22 NOTE — Anesthesia Procedure Notes (Signed)
Anesthesia Procedure Image    

## 2021-09-22 NOTE — Anesthesia Postprocedure Evaluation (Signed)
Anesthesia Post Note  Patient: [name] [name]  Procedure(s) Performed: RIGHT TOTAL KNEE ARTHROPLASTY (Right: Knee)     Patient location during evaluation: PACU Anesthesia Type: Spinal Level of consciousness: oriented and awake and alert Pain management: pain level controlled Vital Signs Assessment: post-procedure vital signs reviewed and stable Respiratory status: spontaneous breathing, respiratory function stable and patient connected to nasal cannula oxygen Cardiovascular status: blood pressure returned to baseline and stable Postop Assessment: no headache, no backache and no apparent nausea or vomiting Anesthetic complications: no   No notable events documented.  Last Vitals:  Vitals:   09/22/21 0945 09/22/21 1000  BP: 137/64 (!) 153/64  Pulse: (!) 57 (!) 56  Resp: 20 18  Temp:    SpO2: 98% 98%    Last Pain:  Vitals:   09/22/21 1000  TempSrc:   PainSc: 0-No pain                 Lorella Gomez S

## 2021-09-22 NOTE — Evaluation (Signed)
Physical Therapy Evaluation Patient Details Name: [name] [name] MRN: [id number] DOB: 1946-04-13 Today's Date: 09/22/2021  History of Present Illness   Patient is 75 y.o. male s/p Rt TKA on 09/22/21 with PMH significant for CAD, CKDIII, anxiety, depression, bradycardia, HTN, GERD, HLD, CABG in 2000, seizrue, syncope, TBI in 2019.    Clinical Impression  [name] [name] is a 75 y.o. male POD 0 s/p Rt TKA. Patient reports independence with mobility at baseline. Patient is now limited by functional impairments (see PT problem list below) and requires min assist for transfers and gait with RW. Patient was able to ambulate ~20 feet with RW and min assist. Patient limited by dizziness/nausea with mobility and BP had orthostatic drop (see vitals). Pt also greatly limited by pain and unable to progress gait to stair training. Patient will benefit from continued skilled PT interventions to address impairments and progress towards PLOF. Acute PT will follow to progress mobility and stair training in preparation for safe discharge home.       Vital Signs Time           BP                Position 1200           172/42          Supine 1227           140/59          Standing after gait 1231           158/63          Seated (0 mins) 1237           148/70          Seated (6 mins - pt still symptomatic)     Recommendations for follow up therapy are one component of a multi-disciplinary discharge planning process, led by the attending physician.  Recommendations may be updated based on patient status, additional functional criteria and insurance authorization.  Follow Up Recommendations      Equipment Recommendations    Rolling Walker   Recommendations for Other Services       Precautions / Restrictions Restrictions Weight Bearing Restrictions: No        09/22/21 1200  PT Visit Information  Last PT Received On 09/22/21  Assistance Needed +1  History of Present Illness Patient is 75 y.o.  male s/p Rt TKA on 09/22/21 with PMH significant for CAD, CKDIII, anxiety, depression, bradycardia, HTN, GERD, HLD, CABG in 2000, seizrue, syncope, TBI in 2019.  Precautions  Precautions Fall  Restrictions  Weight Bearing Restrictions No  Other Position/Activity Restrictions WBAT  Home Living  Family/patient expects to be discharged to: Private residence  Living Arrangements Children  Available Help at Discharge Family  Type of [place] to enter  Entrance Stairs-Number of Steps 4+1  Entrance Stairs-Rails Right  Home Layout One level  Bathroom Biomedical scientist Yes  Home Equipment [place] - single point;Walker - standard  Prior Function  Level of Independence Independent with assistive device(s)  Comments using SPC for mobility, driving, food shopping.  Communication  Communication No difficulties  Pain Assessment  Pain Assessment 0-10  Pain Score 5  Pain Location Rt knee  Pain Descriptors / Indicators Aching;Discomfort  Pain Intervention(s) Limited activity within patient's tolerance;Monitored during session;Premedicated before session;Repositioned  Cognition  Arousal/Alertness Awake/alert  Behavior During Therapy WFL for tasks assessed/performed  Overall  Cognitive Status Within Functional Limits for tasks assessed  Upper Extremity Assessment  Upper Extremity Assessment Overall WFL for tasks assessed  Lower Extremity Assessment  Lower Extremity Assessment RLE deficits/detail  RLE Deficits / Details good quad activation, no extensor lag with SLR.  RLE Sensation WNL  RLE Coordination WNL  Cervical / Trunk Assessment  Cervical / Trunk Assessment Normal  Bed Mobility  Overal bed mobility Needs Assistance  Bed Mobility Supine to Sit  Supine to sit Min guard;HOB elevated  General bed mobility comments guarding for safety with bed mobility, pt able to bring LE's off EOB without assist.  Transfers   Overall transfer level Needs assistance  Equipment used Rolling walker (2 wheeled)  Transfers Sit to/from Stand  Sit to Stand Min guard  General transfer comment cues for safe hand placement with power up from EOB and cues for safe rach back to sit in recliner and extend Rt knee.  Ambulation/Gait  Ambulation/Gait assistance Min assist  Gait Distance (Feet) 16 Feet  Assistive device Rolling walker (2 wheeled)  Gait Pattern/deviations Step-to pattern;Decreased stride length;Decreased weight shift to right  General Gait Details cues for safe step to pattern and assist to manage walker position. Pt c/o dizzness/nausea with short bout of gait. pt's BP noted to have orthostatic drop.  Gait velocity decr  Balance  Overall balance assessment Needs assistance  Sitting-balance support Feet supported  Sitting balance-Leahy Scale Good  Standing balance support During functional activity;Bilateral upper extremity supported  Standing balance-Leahy Scale Poor  Exercises  Exercises Total Joint  Total Joint Exercises  Ankle Circles/Pumps AROM;Both;10 reps;Seated  PT - End of Session  Equipment Utilized During Treatment Gait belt  Activity Tolerance Patient tolerated treatment well  Patient left in chair;with call bell/phone within reach;with chair alarm set  Nurse Communication Mobility status  PT Assessment  PT Recommendation/Assessment Patient needs continued PT services  PT Visit Diagnosis Muscle weakness (generalized) (M62.81);Difficulty in walking, not elsewhere classified (R26.2)  PT Problem List Decreased strength;Decreased range of motion;Decreased activity tolerance;Decreased balance;Decreased mobility;Decreased knowledge of use of DME;Decreased knowledge of precautions;Pain  PT Plan  PT Frequency (ACUTE ONLY) 7X/week  PT Treatment/Interventions (ACUTE ONLY) DME instruction;Gait training;Stair training;Functional mobility training;Therapeutic activities;Therapeutic exercise;Balance  training;Patient/family education  AM-PAC PT "6 Clicks" Mobility Outcome Measure (Version 2)  Help needed turning from your back to your side while in a flat bed without using bedrails? 3  Help needed moving from lying on your back to sitting on the side of a flat bed without using bedrails? 3  Help needed moving to and from a bed to a chair (including a wheelchair)? 3  Help needed standing up from a chair using your arms (e.g., wheelchair or bedside chair)? 3  Help needed to walk in hospital room? 3  Help needed climbing 3-5 steps with a railing?  2  6 Click Score 17  Consider Recommendation of Discharge To: Home with [facility]  Progressive Mobility  What is the highest level of mobility based on the progressive mobility assessment? Level 4 (Walks with assist in room) - Balance while marching in place and cannot step forward and back - Complete  Mobility Out of bed to chair with meals;Out of bed for toileting;Ambulated with assistance in room  PT Recommendation  Follow Up Recommendations Follow surgeon's recommendation for DC plan and follow-up therapies  PT equipment Rolling walker with 5" wheels  Individuals Consulted  Consulted and Agree with Results and Recommendations Patient  Acute Rehab PT Goals  Patient Stated Goal  recover safely  PT Goal Formulation With patient  Time For Goal Achievement 09/29/21  Potential to Achieve Goals Good  PT Time Calculation  PT Start Time (ACUTE ONLY) 1211  PT Stop Time (ACUTE ONLY) 1241  PT Time Calculation (min) (ACUTE ONLY) 30 min  PT General Charges  $$ ACUTE PT VISIT 1 Visit  PT Evaluation  $PT Eval Low Complexity 1 Low  PT Treatments  $Gait Training 8-22 mins  Written Expression  Dominant Hand Right      Verner Mould, DPT Acute Rehabilitation Services Office [phone] Pager [phone]   Jacques Navy 09/22/2021, 6:24 PM

## 2021-09-22 NOTE — Anesthesia Procedure Notes (Signed)
Anesthesia Regional Block: Adductor canal block   Pre-Anesthetic Checklist: , timeout performed,  Correct Patient, Correct Site, Correct Laterality,  Correct Procedure, Correct Position, site marked,  Risks and benefits discussed,  Surgical consent,  Pre-op evaluation,  At surgeon's request and post-op pain management  Laterality: Right  Prep: chloraprep       Needles:  Injection technique: Single-shot  Needle Type: Echogenic Needle     Needle Length: 9cm      Additional Needles:   Procedures:,,,, ultrasound used (permanent image in chart),,    Narrative:  Start time: 09/22/2021 7:04 AM End time: 09/22/2021 7:10 AM Injection made incrementally with aspirations every 5 mL.  Performed by: Personally  Anesthesiologist: Myrtie Soman, MD  Additional Notes: Patient tolerated the procedure well without complications

## 2021-09-22 NOTE — Transfer of Care (Signed)
Immediate Anesthesia Transfer of Care Note  Patient: [name] [name]  Procedure(s) Performed: RIGHT TOTAL KNEE ARTHROPLASTY (Right: Knee)  Patient Location: PACU  Anesthesia Type:MAC, Regional and Spinal  Level of Consciousness: awake  Airway & Oxygen Therapy: Patient Spontanous Breathing  Post-op Assessment: Report given to RN and Post -op Vital signs reviewed and stable  Post vital signs: Reviewed and stable  Last Vitals:  Vitals Value Taken Time  BP 129/58 09/22/21 0931  Temp    Pulse 59 09/22/21 0933  Resp 19 09/22/21 0933  SpO2 100 % 09/22/21 0933  Vitals shown include unvalidated device data.  Last Pain:  Vitals:   09/22/21 0615  TempSrc: (P) Oral         Complications: No notable events documented.

## 2021-09-23 ENCOUNTER — Other Ambulatory Visit (HOSPITAL_BASED_OUTPATIENT_CLINIC_OR_DEPARTMENT_OTHER): Payer: Self-pay

## 2021-09-23 ENCOUNTER — Encounter (HOSPITAL_COMMUNITY): Payer: Self-pay | Admitting: Orthopaedic Surgery

## 2021-09-23 DIAGNOSIS — M1711 Unilateral primary osteoarthritis, right knee: Secondary | ICD-10-CM | POA: Diagnosis not present

## 2021-09-23 LAB — GLUCOSE, CAPILLARY
Glucose-Capillary: 33 mg/dL — CL (ref 70–99)
Glucose-Capillary: 34 mg/dL — CL (ref 70–99)
Glucose-Capillary: 46 mg/dL — ABNORMAL LOW (ref 70–99)
Glucose-Capillary: 84 mg/dL (ref 70–99)
Glucose-Capillary: 206 mg/dL — ABNORMAL HIGH (ref 70–99)

## 2021-09-23 MED ORDER — OXYCODONE HCL 5 MG PO TABS
5.0000 mg | ORAL_TABLET | Freq: Four times a day (QID) | ORAL | 0 refills | Status: DC | PRN
  Filled 2021-09-23: qty 40, 5d supply, fill #0

## 2021-09-23 NOTE — Progress Notes (Signed)
Discharge instructions reviewed with patient and his daughter at bedside. Patient verbalized full understanding of discharge instructions including medications. Daughter will drive patient home.

## 2021-09-23 NOTE — Plan of Care (Signed)
Plan of care reviewed and discussed with the patient. 

## 2021-09-23 NOTE — Progress Notes (Signed)
Physical Therapy Treatment Patient Details Name: [name] [name] MRN: [id number] DOB: [birth year] Today's Date: 09/23/2021   History of Present Illness Patient is 75 y.o. male s/p Rt TKA on 09/22/21 with PMH significant for CAD, CKDIII, anxiety, depression, bradycardia, HTN, GERD, HLD, CABG in 2000, seizrue, syncope, TBI in 2019.    PT Comments    Progressing with mobility. Pt reported moderate pain and fatigue. He denied dizziness while mobilizing but did report some to nursing once settled in recliner. Will plan to have a 2nd session to see if pt will meet his PT goals on today.     Recommendations for follow up therapy are one component of a multi-disciplinary discharge planning process, led by the attending physician.  Recommendations may be updated based on patient status, additional functional criteria and insurance authorization.  Follow Up Recommendations  Follow surgeon's recommendation for DC plan and follow-up therapies     Equipment Recommendations       Recommendations for Other Services       Precautions / Restrictions Precautions Precautions: Fall Restrictions Weight Bearing Restrictions: No Other Position/Activity Restrictions: WBAT     Mobility  Bed Mobility Overal bed mobility: Needs Assistance Bed Mobility: Supine to Sit     Supine to sit: Supervision     General bed mobility comments: Supv for safety. Pt denied dizziness.    Transfers Overall transfer level: Needs assistance Equipment used: Rolling walker (2 wheeled) Transfers: Sit to/from Stand Sit to Stand: Min guard         General transfer comment: Cues for safety, technique, hand/LE placement. Min guard for safety  Ambulation/Gait Ambulation/Gait assistance: Min guard Gait Distance (Feet): 65 Feet Assistive device: Rolling walker (2 wheeled) Gait Pattern/deviations: Step-through pattern;Decreased stride length     General Gait Details: Min guard for safety. Slow but steady gait.  Followed with recliner and used it to transport pt back to room. Pt denied dizziness but he c/o pain and fatigue.   Stairs             Wheelchair Mobility    Modified Rankin (Stroke Patients Only)       Balance Overall balance assessment: Needs assistance         Standing balance support: Bilateral upper extremity supported Standing balance-Leahy Scale: Fair                              Cognition Arousal/Alertness: Awake/alert Behavior During Therapy: WFL for tasks assessed/performed Overall Cognitive Status: Within Functional Limits for tasks assessed                                        Exercises Total Joint Exercises Ankle Circles/Pumps: AROM;Both;10 reps Quad Sets: AROM;Right;10 reps Hip ABduction/ADduction: AROM;Right;10 reps Straight Leg Raises: AROM;Right;10 reps Knee Flexion: AROM;Right;10 reps Goniometric ROM: ~10-70 degrees    General Comments        Pertinent Vitals/Pain Pain Assessment: 0-10 Pain Location: R knee Pain Descriptors / Indicators: Discomfort;Sore Pain Intervention(s): Limited activity within patient's tolerance;Monitored during session;Ice applied;Patient requesting pain meds-RN notified    Home Living                      Prior Function            PT Goals (current goals can now be found in the care plan section)  Progress towards PT goals: Progressing toward goals    Frequency    7X/week      PT Plan Current plan remains appropriate    Co-evaluation              AM-PAC PT "6 Clicks" Mobility   Outcome Measure  Help needed turning from your back to your side while in a flat bed without using bedrails?: A Little Help needed moving from lying on your back to sitting on the side of a flat bed without using bedrails?: A Little Help needed moving to and from a bed to a chair (including a wheelchair)?: A Little Help needed standing up from a chair using your arms (e.g.,  wheelchair or bedside chair)?: A Little Help needed to walk in hospital room?: A Little Help needed climbing 3-5 steps with a railing? : A Lot 6 Click Score: 17    End of Session Equipment Utilized During Treatment: Gait belt Activity Tolerance: Patient tolerated treatment well Patient left: in chair;with call bell/phone within reach;with family/visitor present   PT Visit Diagnosis: Other abnormalities of gait and mobility (R26.89);Pain Pain - Right/Left: Right Pain - part of body: Knee     Time: 1010-1036 PT Time Calculation (min) (ACUTE ONLY): 26 min  Charges:  $Gait Training: 8-22 mins $Therapeutic Exercise: 8-22 mins                        Doreatha Massed, PT Acute Rehabilitation  Office: [phone] Pager: [phone]

## 2021-09-23 NOTE — TOC Transition Note (Signed)
Transition of Care [facility]) - CM/SW Discharge Note  Patient Details  Name: [name] [name] MRN: [id number] Date of Birth: May 22, 1946  [facility] CM/SW Contact:  Sherie Don, LCSW Phone Number: 09/23/2021, 11:16 AM  Clinical Narrative: Patient is expected to discharge home after working with PT. CSW met with patient and daughter to discuss discharge plan and needs. Patient will discharge home with HHPT through [place] and then transition to [place] at [place] on [facility]. Daughter asked about switching OPPT locations and CSW explained she will need to ask the orthopedist's office as they set it up. Patient will need a rolling walker and wanted a 3N1, but declined as [employer] informed him it would not be covered at this time by Medicare. [employer] delivered walker to patient's room. TOC signing off.  Final next level of care: [place] Barriers to Discharge: No Barriers Identified  Patient Goals and CMS Choice Patient states their goals for this hospitalization and ongoing recovery are:: Discharge home with [place] CMS Medicare.gov Compare Post Acute Care list provided to:: Patient  Discharge Plan and Services         DME Arranged: Walker rolling DME Agency: [employer] Date DME Agency Contacted: 09/23/21 Representative spoke with at DME Agency: Ovid Curd HH Arranged: PT [place]: [facility] Representative spoke with at [place] in orthopedist's office  Readmission Risk Interventions No flowsheet data found.

## 2021-09-23 NOTE — Progress Notes (Signed)
Subjective: 1 Day Post-Op Procedure(s) (LRB): RIGHT TOTAL KNEE ARTHROPLASTY (Right)  Patient is resting comfortably this morning. His blood glucose came back at 33. He was given orange juice and graham crackers. He feels perfectly fine and asymptomatic. He is hoping to go home this morning.  Activity level:  wbat Diet tolerance:  ok Voiding:  ok Patient reports pain as mild.    Objective: Vital signs in last 24 hours: Temp:  [97.4 F (36.3 C)-98.4 F (36.9 C)] 98.4 F (36.9 C) (10/19 0214) Pulse Rate:  [51-59] 56 (10/19 0535) Resp:  [10-20] 16 (10/19 0535) BP: (121-161)/(48-75) 140/48 (10/19 0535) SpO2:  [94 %-100 %] 97 % (10/19 0535) Weight:  [93 kg] 93 kg (10/18 1511)  Labs: No results for input(s): HGB in the last 72 hours. No results for input(s): WBC, RBC, HCT, PLT in the last 72 hours. No results for input(s): NA, K, CL, CO2, BUN, CREATININE, GLUCOSE, CALCIUM in the last 72 hours. No results for input(s): LABPT, INR in the last 72 hours.  Physical Exam:  Neurologically intact ABD soft Neurovascular intact Sensation intact distally Intact pulses distally Dorsiflexion/Plantar flexion intact Incision: dressing C/D/I and no drainage No cellulitis present Compartment soft  Assessment/Plan:  1 Day Post-Op Procedure(s) (LRB): RIGHT TOTAL KNEE ARTHROPLASTY (Right) Advance diet Up with therapy D/C IV fluids Discharge home with home health today after PT if cleared and doing well. His blood glucose will be rechecked in the next 30 min to make sure it is coming back up. He will continue on his home eliquis. Follow up in office 2 weeks post op.    Larwance Sachs Kamryn Gauthier 09/23/2021, 7:44 AM

## 2021-09-23 NOTE — Progress Notes (Signed)
Physical Therapy Treatment Patient Details Name: [name] [name] MRN: [id number] DOB: 1946/02/23 Today's Date: 09/23/2021   History of Present Illness Patient is 75 y.o. male s/p Rt TKA on 09/22/21 with PMH significant for CAD, CKDIII, anxiety, depression, bradycardia, HTN, GERD, HLD, CABG in 2000, seizrue, syncope, TBI in 2019.    PT Comments    Progressing with mobility. Reviewed gait and stair training. All education completed. Okay to d/c from PT standpoint.     Recommendations for follow up therapy are one component of a multi-disciplinary discharge planning process, led by the attending physician.  Recommendations may be updated based on patient status, additional functional criteria and insurance authorization.  Follow Up Recommendations  Follow surgeon's recommendation for DC plan and follow-up therapies     Equipment Recommendations       Recommendations for Other Services       Precautions / Restrictions Precautions Precautions: Fall Restrictions Weight Bearing Restrictions: No Other Position/Activity Restrictions: WBAT     Mobility  Bed Mobility Overal bed mobility: Needs Assistance Bed Mobility: Supine to Sit          General bed mobility comments: oob in recliner    Transfers Overall transfer level: Needs assistance Equipment used: Rolling walker (2 wheeled) Transfers: Sit to/from Stand Sit to Stand: Min guard         General transfer comment: Cues for safety, technique, hand/LE placement. Min guard for safety  Ambulation/Gait Ambulation/Gait assistance: Min guard Gait Distance (Feet): 75 Feet Assistive device: Rolling walker (2 wheeled) Gait Pattern/deviations: Step-through pattern;Decreased stride length     General Gait Details: Min guard for safety. Slow but steady gait.   Stairs Stairs: Yes Min guard Stair Management: Step to pattern;Forwards;One rail Right Number of Stairs: 2 General stair comments: up and over portable  stairs. cues for safety, technique, sequence. daughter present to observe.   Wheelchair Mobility    Modified Rankin (Stroke Patients Only)       Balance Overall balance assessment: Needs assistance         Standing balance support: Bilateral upper extremity supported Standing balance-Leahy Scale: Fair                              Cognition Arousal/Alertness: Awake/alert Behavior During Therapy: WFL for tasks assessed/performed Overall Cognitive Status: Within Functional Limits for tasks assessed                                        Exercises     General Comments        Pertinent Vitals/Pain Pain Assessment: 0-10 Pain Score: 7  Pain Location: R knee Pain Descriptors / Indicators: Discomfort;Sore Pain Intervention(s): Limited activity within patient's tolerance;Monitored during session    Home Living                      Prior Function            PT Goals (current goals can now be found in the care plan section) Progress towards PT goals: Progressing toward goals    Frequency    7X/week      PT Plan Current plan remains appropriate    Co-evaluation              AM-PAC PT "6 Clicks" Mobility   Outcome Measure  Help needed turning from your  back to your side while in a flat bed without using bedrails?: A Little Help needed moving from lying on your back to sitting on the side of a flat bed without using bedrails?: A Little Help needed moving to and from a bed to a chair (including a wheelchair)?: A Little Help needed standing up from a chair using your arms (e.g., wheelchair or bedside chair)?: A Little Help needed to walk in hospital room?: A Little Help needed climbing 3-5 steps with a railing? : A Little 6 Click Score: 18    End of Session Equipment Utilized During Treatment: Gait belt Activity Tolerance: Patient tolerated treatment well Patient left: in chair;with call bell/phone within reach;with  family/visitor present   PT Visit Diagnosis: Other abnormalities of gait and mobility (R26.89) Pain - Right/Left: Right Pain - part of body: Knee     Time: 2671-2458 PT Time Calculation (min) (ACUTE ONLY): 17 min  Charges:  $Gait Training: 8-22 mins $Therapeutic Exercise: 8-22 mins                         Doreatha Massed, PT Acute Rehabilitation  Office: [phone] Pager: [phone]

## 2021-09-23 NOTE — Progress Notes (Signed)
Hypoglycemic Event  CBG: 34  Treatment: 8 oz juice/soda  Symptoms: None  Follow-up CBG: Time:0759 CBG Result:46  Possible Reasons for Event: Unknown  Comments/MD notified: Loni Dolly PA on unit and aware of blood sugar.    CBG: 46  Treatment: 8oz juice/soda and gram crackers.  Follow-up CBG Time: 0825    CBG: 84  Possible reason for event: unknown       Malena Catholic

## 2021-09-23 NOTE — Discharge Summary (Signed)
Patient ID: [name] [name] MRN: [id number] DOB/AGE: [birth year] 73 y.o.  Admit date: 09/22/2021 Discharge date: 09/23/2021  Admission Diagnoses:  Principal Problem:   Primary osteoarthritis of right knee   Discharge Diagnoses:  Same  Past Medical History:  Diagnosis Date   Acquired atrophy of thyroid 06/05/2013   Overview:  July 2014: controlled on synthroid 11mcg since 2012 May 2015: decreased to Synthroid 50 mcg  Last Assessment & Plan:  Pt doing well with most recent TSH within normal range.  Will continue current dosage of Synthroid 63mcg. Pt reminded to take medication on empty stomach. Will continue to monitor with periodic laboratory assessment in 3 months.    Anemia    hemoglobin 7.4, iron deficiency, January, 2011, 2 unit transfusion, endoscopy normal, capsule endoscopy February, 2011 no small bowel abnormalities.   Most likely source gastric erosions, followed by GI   Anxiety    Bradycardia    CAD (coronary artery disease)    A. CABG in 2000,status post cardiac cath in 2006, 2009 ....continued chest pain and SOB despite oral medication adjestments including Ranexa. B. Cath November 2009/ mRCA - 2.75 x 23 Abbott Xience V drug-eluting stent ...11/26/2008 to distal  RCA leading to acute marginal.  C. Cath 07/2012 for CP - stable anatomy, med rx. d. cath 2015 and 05/30/2015 stable anatomy, consider Myoview if has CP again   Carotid artery disease ([place]) 08/01/2015   Doppler, May 29, 2015, 1-39% bilateral ICA    Cerebral ischemia    MRI November, 2010, chronic microvascular ischemia   CKD (chronic kidney disease), stage III ([place])    Concussion    Depression    Bipolar   Diabetes mellitus ([place])    Edema    Essential hypertension 06/05/2013   Overview:  July 2014: Controlled with Bystolic Sep 3086: Changed to atenolol.  Last Assessment & Plan:  Will change to atenolol for cost and follow Improved.  Medication compliance strongly encouraged BP: 116/64 mmHg    Overview:  July  2014: Controlled with Bystolic Sep 5784: Changed to atenolol.  Last Assessment & Plan:  Will change to atenolol for cost and follow   Falling episodes    these have occurred in the past and again recurring 2011   Family history of adverse reaction to anesthesia    "mother died during bypass surgery but not sure if it has to do with anesthesia"   Gastric ulcer    GERD (gastroesophageal reflux disease)    H/O medication noncompliance    Due to loss of insurance   H/O multiple concussions    Hard of hearing    Heart murmur    History of blood transfusion 12/20/2013   History of kidney stones    Hx of CABG    2000,  / one median sternotomy suture broken her chest x-ray November, 2010, no clinical significance   Hyperlipidemia    Hypertension    pt. denies   Iron deficiency anemia    Long term (current) use of anticoagulants [Z79.01] 07/20/2016   Low back pain 06/12/2009   Qualifier: Diagnosis of  By: [name]    Nephrolithiasis    Orthostasis    OSA (obstructive sleep apnea)    PAF (paroxysmal atrial fibrillation) ([place])    a. dx 2017, started on amiodarone/warfarin but patient intermittently noncompliant.   PSVT (paroxysmal supraventricular tachycardia) ([place])    RBBB 07/09/2009   Qualifier: Diagnosis of  By: [name], MD, [facility], [name]    RLS (  restless legs syndrome) 09/19/2009   Qualifier: Diagnosis of  By: [name]   Overview:  July 2014: Controlled with Mirapex  Last Assessment & Plan:  Patient is doing well. Will continue current management and follow clinically.   Seizure disorder ([place]) 01/09/2017   Spondylosis    C5-6, C6-7 MRI 2010   Syncope 03/2016   TBI (traumatic brain injury) 2019   Thyroid disease    Tubulovillous adenoma of colon 2007   Vitamin D deficiency 05/17/2017   Wears glasses     Surgeries: Procedure(s): RIGHT TOTAL KNEE ARTHROPLASTY on 09/22/2021   Consultants:   Discharged Condition: Improved  Hospital Course: MYKA LUKINS  is an 75 y.o. male who was admitted 09/22/2021 for operative treatment ofPrimary osteoarthritis of right knee. Patient has severe unremitting pain that affects sleep, daily activities, and work/hobbies. After pre-op clearance the patient was taken to the operating room on 09/22/2021 and underwent  Procedure(s): RIGHT TOTAL KNEE ARTHROPLASTY.    Patient was given perioperative antibiotics:  Anti-infectives (From admission, onward)    Start     Dose/Rate Route Frequency Ordered Stop   09/23/21 0200  ceFAZolin (ANCEF) IVPB 2g/100 mL premix        2 g 200 mL/hr over 30 Minutes Intravenous  Once 09/22/21 2226 09/23/21 0243   09/22/21 1400  ceFAZolin (ANCEF) IVPB 2g/100 mL premix        2 g 200 mL/hr over 30 Minutes Intravenous Every 6 hours 09/22/21 0946 09/23/21 0159   09/22/21 0615  ceFAZolin (ANCEF) IVPB 2g/100 mL premix        2 g 200 mL/hr over 30 Minutes Intravenous On call to O.R. 09/22/21 0600 09/22/21 0750   09/22/21 0603  ceFAZolin (ANCEF) 2-4 GM/100ML-% IVPB       Note to Pharmacy: Charmayne Sheer   : cabinet override      09/22/21 0603 09/22/21 0742        Patient was given sequential compression devices, early ambulation, and chemoprophylaxis to prevent DVT.  Patient benefited maximally from hospital stay and there were no complications.    Recent vital signs: Patient Vitals for the past 24 hrs:  BP Temp Temp src Pulse Resp SpO2 Height Weight  09/23/21 0535 (!) 140/48 -- -- (!) 56 16 97 % -- --  09/23/21 0214 (!) 125/54 98.4 F (36.9 C) -- (!) 56 18 98 % -- --  09/22/21 2057 (!) 151/65 -- -- (!) 58 18 97 % -- --  09/22/21 1712 121/63 97.6 F (36.4 C) Oral (!) 54 14 96 % -- --  09/22/21 1511 131/66 98.3 F (36.8 C) Oral (!) 54 16 96 % 5' 8.9" (1.75 m) 93 kg  09/22/21 1400 140/67 98.1 F (36.7 C) -- (!) 52 20 94 % -- --  09/22/21 1230 (!) 158/63 -- -- (!) 51 15 95 % -- --  09/22/21 1115 -- -- -- (!) 51 -- 96 % -- --  09/22/21 1100 (!) 161/75 97.7 F (36.5 C) -- (!) 53  -- 96 % -- --  09/22/21 1044 (!) 161/64 97.7 F (36.5 C) Oral (!) 55 20 94 % -- --  09/22/21 1030 (!) 154/66 (!) 97.5 F (36.4 C) -- (!) 55 13 96 % -- --  09/22/21 1015 140/61 -- -- (!) 56 10 97 % -- --  09/22/21 1000 (!) 153/64 -- -- (!) 56 18 98 % -- --  09/22/21 0945 137/64 -- -- (!) 57 20 98 % -- --  09/22/21 0932 (!) 129/58 (!) 97.4 F (36.3 C) -- (!) 59 17 100 % -- --     Recent laboratory studies: No results for input(s): WBC, HGB, HCT, PLT, NA, K, CL, CO2, BUN, CREATININE, GLUCOSE, INR, CALCIUM in the last 72 hours.  Invalid input(s): PT, 2   Discharge Medications:   Allergies as of 09/23/2021       Reactions   Morphine Other (See Comments)   Hallucinations   Shellfish-derived Products Itching        Medication List     TAKE these medications    albuterol 108 (90 Base) MCG/ACT inhaler Commonly known as: VENTOLIN HFA Inhale 2 puffs into the lungs every 6 (six) hours as needed for wheezing or shortness of breath.   amLODipine 5 MG tablet Commonly known as: NORVASC TAKE 1 TABLET BY MOUTH ONCE DAILY   aspirin EC 81 MG tablet Take 81 mg by mouth daily. Swallow whole.   atorvastatin 80 MG tablet Commonly known as: LIPITOR Take 1 tablet (80 mg total) by mouth daily. What changed: when to take this   busPIRone 7.5 MG tablet Commonly known as: BUSPAR Take 1 tablet (7.5 mg total) by mouth 2 (two) times daily.   cetirizine 10 MG tablet Commonly known as: ZYRTEC TAKE 1 TABLET (10 MG TOTAL) BY MOUTH DAILY.   divalproex 500 MG DR tablet Commonly known as: Depakote Take 1 tablet (500 mg total) by mouth 2 (two) times daily.   Eliquis 5 MG Tabs tablet Generic drug: apixaban Take 1 tablet (5 mg total) by mouth 2 (two) times daily.   famotidine 20 MG tablet Commonly known as: PEPCID TAKE 1 TABLET BY MOUTH 2 TIMES DAILY   fenofibrate 145 MG tablet Commonly known as: TRICOR TAKE 1 TABLET (145 MG TOTAL) BY MOUTH DAILY.   FreeStyle Libre 14 Day Sensor  Misc USE TO CHECK BLOOD SUGAR AND CHANGE EVERY 14 DAYS   furosemide 20 MG tablet Commonly known as: LASIX TAKE 1 TABLET BY MOUTH DAILY AS NEEDED FOR EDEMA   glucose blood test strip Commonly known as: Accu-Chek Aviva Use as directed to check blood sugar 4 times daily.  DX E11.9   hydrocortisone 2.5 % cream Apply on to the skin 2 (two) times daily. What changed:  how much to take when to take this reasons to take this   hydrOXYzine 10 MG tablet Commonly known as: ATARAX/VISTARIL Take 1 tablet (10 mg total) by mouth 3 (three) times daily as needed for anxiety or itching.   Insulin Pen Needle 31G X 8 MM Misc use to inject insulin twice a day   isosorbide mononitrate 120 MG 24 hr tablet Commonly known as: IMDUR TAKE 1 TABLET (120 MG TOTAL) BY MOUTH DAILY.   levothyroxine 100 MCG tablet Commonly known as: SYNTHROID TAKE 1 TABLET (100 MCG TOTAL) BY MOUTH DAILY.   meclizine 25 MG tablet Commonly known as: ANTIVERT Take 1 tablet (25 mg total) by mouth 3 (three) times daily as needed for dizziness.   metoprolol succinate 50 MG 24 hr tablet Commonly known as: TOPROL-XL TAKE 1 AND 1/2 TABLETS EVERY DAY FOR A TOTAL OF 75 MG   nitroGLYCERIN 0.4 MG SL tablet Commonly known as: NITROSTAT PLACE 1 TABLET UNDER THE TONGUE EVERY 5 MINUTES AS NEEDED FOR CHEST PAIN.   oxyCODONE 5 MG immediate release tablet Commonly known as: Oxy IR/ROXICODONE Take 1-2 tablets (5-10 mg total) by mouth every 6 (six) hours as needed for moderate pain or severe pain (post  op pain).   pantoprazole 40 MG tablet Commonly known as: PROTONIX TAKE 1 TABLET BY MOUTH EVERY DAY   Pennsaid 2 % Soln Generic drug: Diclofenac Sodium Place 1 application onto the skin 2 (two) times daily. What changed:  when to take this reasons to take this   pramipexole 1 MG tablet Commonly known as: MIRAPEX TAKE 1/2 TO 1 TABLET AT BEDTIME What changed: See the new instructions.   ranolazine 500 MG 12 hr tablet Commonly  known as: RANEXA TAKE 1 TABLET BY MOUTH TWICE DAILY   sertraline 100 MG tablet Commonly known as: ZOLOFT TAKE 1 TABLET BY MOUTH TWICE DAILY   tiZANidine 4 MG tablet Commonly known as: ZANAFLEX TAKE 1 TABLET BY MOUTH EVERY 6 HOURS AS NEEDED FOR MUSCLE SPASMS   Toujeo Max SoloStar 300 UNIT/ML Solostar Pen Generic drug: insulin glargine (2 Unit Dial) Inject 130 Units into the skin every morning.   traMADol 50 MG tablet Commonly known as: ULTRAM Take 1 tablet (50 mg total) by mouth every 12 (twelve) hours as needed. May take 1 tablet by mouth every 8 hours, maximum 6 tabs per day.   triamcinolone cream 0.1 % Commonly known as: KENALOG Apply 1 application topically 2 (two) times daily. Use as needed for itchy rash What changed:  when to take this reasons to take this   triamcinolone cream 0.5 % Commonly known as: KENALOG Apply 1 application on the skin twice a day What changed: Another medication with the same name was changed. Make sure you understand how and when to take each.   UltiCare Insulin Syringe 31G X 5/16" 1 ML Misc Generic drug: Insulin Syringe-Needle U-100 USE AS DIRECTED WITH NOVOLIN   Vitamin D (Ergocalciferol) 1.25 MG (50000 UNIT) Caps capsule Commonly known as: DRISDOL TAKE 1 CAPSULE BY MOUTH EVERY 7 DAYS What changed:  how much to take when to take this               Durable Medical Equipment  (From admission, onward)           Start     Ordered   09/22/21 1507  DME Walker rolling  Once       Question:  Patient needs a walker to treat with the following condition  Answer:  Primary osteoarthritis of right knee   09/22/21 1506   09/22/21 1507  DME 3 n 1  Once        09/22/21 1506   09/22/21 1507  DME Bedside commode  Once       Question:  Patient needs a bedside commode to treat with the following condition  Answer:  Primary osteoarthritis of right knee   09/22/21 1506            Diagnostic Studies: CT HEAD WO CONTRAST  (5MM)  Result Date: 08/30/2021 CLINICAL DATA:  Dizziness EXAM: CT HEAD WITHOUT CONTRAST TECHNIQUE: Contiguous axial images were obtained from the base of the skull through the vertex without intravenous contrast. COMPARISON:  CT 05/08/2021 FINDINGS: Brain: No acute territorial infarction, hemorrhage or intracranial mass. There is atrophy and mild chronic small vessel ischemic change of the white matter. The ventricles are stable size Vascular: No hyperdense vessels.  Carotid vascular calcification Skull: Normal. Negative for fracture or focal lesion. Sinuses/Orbits: No acute finding. Other: None IMPRESSION: 1. No CT evidence for acute intracranial abnormality. 2. Atrophy and chronic small vessel ischemic changes of the white matter. Electronically Signed   By: Donavan Foil [name]   On:  08/30/2021 16:13   MR ANGIO HEAD WO CONTRAST  Result Date: 08/30/2021 CLINICAL DATA:  Initial evaluation for neuro deficit, stroke suspected. EXAM: MRI HEAD WITHOUT CONTRAST MRA HEAD WITHOUT CONTRAST MRA NECK WITHOUT AND WITH CONTRAST TECHNIQUE: Multiplanar, multi-echo pulse sequences of the brain and surrounding structures were acquired without intravenous contrast. Angiographic images of the Circle of Willis were acquired using MRA technique without intravenous contrast. Angiographic images of the neck were acquired using MRA technique without and with intravenous contrast. Carotid stenosis measurements (when applicable) are obtained utilizing NASCET criteria, using the distal internal carotid diameter as the denominator. CONTRAST:  10 cc of Gadavist. COMPARISON:  Prior head CT from earlier the same day. FINDINGS: MRI HEAD FINDINGS Brain: Diffuse prominence of the CSF containing spaces compatible generalized age-related cerebral atrophy. Patchy T2/FLAIR hyperintensity involving the periventricular deep white matter both cerebral hemispheres, most consistent with chronic small vessel ischemic disease, overall mild in nature.  Superimposed small remote lacunar infarct present at the posterior left cerebral centrum semi ovale. No abnormal foci of restricted diffusion to suggest acute or subacute ischemia. Gray-white matter differentiation maintained. No encephalomalacia to suggest chronic cortical infarction. No evidence for acute or chronic intracranial hemorrhage. No mass lesion, midline shift or mass effect. No hydrocephalus or extra-axial fluid collection. Pituitary gland and suprasellar region normal. Midline structures intact. Vascular: Major intracranial vascular flow voids are maintained. Skull and upper cervical spine: Craniocervical junction within normal limits. Bone marrow signal intensity normal. No scalp soft tissue abnormality. Sinuses/Orbits: Globes and orbital soft tissues within normal limits. Minimal mucosal thickening noted within the ethmoidal air cells. Paranasal sinuses are otherwise clear. Trace left mastoid effusion noted, of doubtful significance. Inner ear structures grossly normal. Other: None. MRA HEAD FINDINGS Anterior circulation: Visualized distal cervical segments of the internal carotid arteries are patent with antegrade flow. Petrous, cavernous, and supraclinoid segments patent without stenosis or other abnormality. A1 segments patent bilaterally. Left A1 slightly hypoplastic. Normal anterior communicating artery complex. Anterior cerebral arteries patent to their distal aspects without stenosis. Normal in stenosis or occlusion. Normal MCA bifurcations. Distal MCA branches well perfused and symmetric. Posterior circulation: Visualized vertebral arteries patent to the vertebrobasilar junction without stenosis. Right vertebral artery dominant. Right PICA origin patent. Left PICA origin not visualized. Tiny short-segment fenestration noted at the vertebrobasilar junction. Basilar patent to its distal aspect without stenosis. Superior cerebellar arteries patent bilaterally. Right PCA supplied via the basilar.  Left PCA supplied via the basilar as well as a prominent left posterior communicating artery. Both PCAs well perfused or distal aspects without stenosis. Anatomic variants: None significant.  No intracranial aneurysm. MRA NECK FINDINGS Aortic arch: Visualized aortic arch normal in caliber with normal 3 vessel morphology. No hemodynamically significant stenosis seen about the origin of the great vessels. Right carotid system: Right common carotid artery patent from its origin to the bifurcation without stenosis. No significant atheromatous narrowing or irregularity about the right bifurcation. Right ICA patent distally without stenosis, evidence for dissection or occlusion. Left carotid system: Left CCA widely patent proximally. Eccentric plaque within the distal left CCA with associated stenosis of up to 50% by NASCET criteria (series 2, image 92). No significant atheromatous change about the left carotid bulb distally. Left ICA widely patent without stenosis, evidence for dissection or occlusion. Vertebral arteries: Both vertebral arteries arise from the subclavian arteries. No proximal subclavian artery stenosis. Right vertebral artery dominant. Vertebral arteries patent without stenosis, evidence for dissection or occlusion. Other: Patient is status post ACDF at C5-C7.  Median sternotomy wires noted. IMPRESSION: MRI HEAD: 1. No acute intracranial abnormality. 2. Age-related cerebral atrophy with mild chronic small vessel ischemic disease. MRA HEAD: Negative intracranial MRA. No large vessel occlusion, hemodynamically significant stenosis, or other acute vascular abnormality. MRA NECK: 1. Eccentric plaque within the distal left CCA with associated stenosis of up to 40-50% by NASCET criteria. 2. No hemodynamically significant stenosis about the right carotid artery system. 3. Wide patency of both vertebral arteries within the neck. Right vertebral artery dominant. Electronically Signed   By: Jeannine Boga  [name]   On: 08/30/2021 22:01   MR Angiogram Neck W or Wo Contrast  Result Date: 08/30/2021 CLINICAL DATA:  Initial evaluation for neuro deficit, stroke suspected. EXAM: MRI HEAD WITHOUT CONTRAST MRA HEAD WITHOUT CONTRAST MRA NECK WITHOUT AND WITH CONTRAST TECHNIQUE: Multiplanar, multi-echo pulse sequences of the brain and surrounding structures were acquired without intravenous contrast. Angiographic images of the Circle of Willis were acquired using MRA technique without intravenous contrast. Angiographic images of the neck were acquired using MRA technique without and with intravenous contrast. Carotid stenosis measurements (when applicable) are obtained utilizing NASCET criteria, using the distal internal carotid diameter as the denominator. CONTRAST:  10 cc of Gadavist. COMPARISON:  Prior head CT from earlier the same day. FINDINGS: MRI HEAD FINDINGS Brain: Diffuse prominence of the CSF containing spaces compatible generalized age-related cerebral atrophy. Patchy T2/FLAIR hyperintensity involving the periventricular deep white matter both cerebral hemispheres, most consistent with chronic small vessel ischemic disease, overall mild in nature. Superimposed small remote lacunar infarct present at the posterior left cerebral centrum semi ovale. No abnormal foci of restricted diffusion to suggest acute or subacute ischemia. Gray-white matter differentiation maintained. No encephalomalacia to suggest chronic cortical infarction. No evidence for acute or chronic intracranial hemorrhage. No mass lesion, midline shift or mass effect. No hydrocephalus or extra-axial fluid collection. Pituitary gland and suprasellar region normal. Midline structures intact. Vascular: Major intracranial vascular flow voids are maintained. Skull and upper cervical spine: Craniocervical junction within normal limits. Bone marrow signal intensity normal. No scalp soft tissue abnormality. Sinuses/Orbits: Globes and orbital soft tissues  within normal limits. Minimal mucosal thickening noted within the ethmoidal air cells. Paranasal sinuses are otherwise clear. Trace left mastoid effusion noted, of doubtful significance. Inner ear structures grossly normal. Other: None. MRA HEAD FINDINGS Anterior circulation: Visualized distal cervical segments of the internal carotid arteries are patent with antegrade flow. Petrous, cavernous, and supraclinoid segments patent without stenosis or other abnormality. A1 segments patent bilaterally. Left A1 slightly hypoplastic. Normal anterior communicating artery complex. Anterior cerebral arteries patent to their distal aspects without stenosis. Normal in stenosis or occlusion. Normal MCA bifurcations. Distal MCA branches well perfused and symmetric. Posterior circulation: Visualized vertebral arteries patent to the vertebrobasilar junction without stenosis. Right vertebral artery dominant. Right PICA origin patent. Left PICA origin not visualized. Tiny short-segment fenestration noted at the vertebrobasilar junction. Basilar patent to its distal aspect without stenosis. Superior cerebellar arteries patent bilaterally. Right PCA supplied via the basilar. Left PCA supplied via the basilar as well as a prominent left posterior communicating artery. Both PCAs well perfused or distal aspects without stenosis. Anatomic variants: None significant.  No intracranial aneurysm. MRA NECK FINDINGS Aortic arch: Visualized aortic arch normal in caliber with normal 3 vessel morphology. No hemodynamically significant stenosis seen about the origin of the great vessels. Right carotid system: Right common carotid artery patent from its origin to the bifurcation without stenosis. No significant atheromatous narrowing or irregularity about the right  bifurcation. Right ICA patent distally without stenosis, evidence for dissection or occlusion. Left carotid system: Left CCA widely patent proximally. Eccentric plaque within the distal  left CCA with associated stenosis of up to 50% by NASCET criteria (series 2, image 92). No significant atheromatous change about the left carotid bulb distally. Left ICA widely patent without stenosis, evidence for dissection or occlusion. Vertebral arteries: Both vertebral arteries arise from the subclavian arteries. No proximal subclavian artery stenosis. Right vertebral artery dominant. Vertebral arteries patent without stenosis, evidence for dissection or occlusion. Other: Patient is status post ACDF at C5-C7. Median sternotomy wires noted. IMPRESSION: MRI HEAD: 1. No acute intracranial abnormality. 2. Age-related cerebral atrophy with mild chronic small vessel ischemic disease. MRA HEAD: Negative intracranial MRA. No large vessel occlusion, hemodynamically significant stenosis, or other acute vascular abnormality. MRA NECK: 1. Eccentric plaque within the distal left CCA with associated stenosis of up to 40-50% by NASCET criteria. 2. No hemodynamically significant stenosis about the right carotid artery system. 3. Wide patency of both vertebral arteries within the neck. Right vertebral artery dominant. Electronically Signed   By: Jeannine Boga [name]   On: 08/30/2021 22:01   MR BRAIN WO CONTRAST  Result Date: 08/30/2021 CLINICAL DATA:  Initial evaluation for neuro deficit, stroke suspected. EXAM: MRI HEAD WITHOUT CONTRAST MRA HEAD WITHOUT CONTRAST MRA NECK WITHOUT AND WITH CONTRAST TECHNIQUE: Multiplanar, multi-echo pulse sequences of the brain and surrounding structures were acquired without intravenous contrast. Angiographic images of the Circle of Willis were acquired using MRA technique without intravenous contrast. Angiographic images of the neck were acquired using MRA technique without and with intravenous contrast. Carotid stenosis measurements (when applicable) are obtained utilizing NASCET criteria, using the distal internal carotid diameter as the denominator. CONTRAST:  10 cc of Gadavist.  COMPARISON:  Prior head CT from earlier the same day. FINDINGS: MRI HEAD FINDINGS Brain: Diffuse prominence of the CSF containing spaces compatible generalized age-related cerebral atrophy. Patchy T2/FLAIR hyperintensity involving the periventricular deep white matter both cerebral hemispheres, most consistent with chronic small vessel ischemic disease, overall mild in nature. Superimposed small remote lacunar infarct present at the posterior left cerebral centrum semi ovale. No abnormal foci of restricted diffusion to suggest acute or subacute ischemia. Gray-white matter differentiation maintained. No encephalomalacia to suggest chronic cortical infarction. No evidence for acute or chronic intracranial hemorrhage. No mass lesion, midline shift or mass effect. No hydrocephalus or extra-axial fluid collection. Pituitary gland and suprasellar region normal. Midline structures intact. Vascular: Major intracranial vascular flow voids are maintained. Skull and upper cervical spine: Craniocervical junction within normal limits. Bone marrow signal intensity normal. No scalp soft tissue abnormality. Sinuses/Orbits: Globes and orbital soft tissues within normal limits. Minimal mucosal thickening noted within the ethmoidal air cells. Paranasal sinuses are otherwise clear. Trace left mastoid effusion noted, of doubtful significance. Inner ear structures grossly normal. Other: None. MRA HEAD FINDINGS Anterior circulation: Visualized distal cervical segments of the internal carotid arteries are patent with antegrade flow. Petrous, cavernous, and supraclinoid segments patent without stenosis or other abnormality. A1 segments patent bilaterally. Left A1 slightly hypoplastic. Normal anterior communicating artery complex. Anterior cerebral arteries patent to their distal aspects without stenosis. Normal in stenosis or occlusion. Normal MCA bifurcations. Distal MCA branches well perfused and symmetric. Posterior circulation:  Visualized vertebral arteries patent to the vertebrobasilar junction without stenosis. Right vertebral artery dominant. Right PICA origin patent. Left PICA origin not visualized. Tiny short-segment fenestration noted at the vertebrobasilar junction. Basilar patent to its distal aspect without  stenosis. Superior cerebellar arteries patent bilaterally. Right PCA supplied via the basilar. Left PCA supplied via the basilar as well as a prominent left posterior communicating artery. Both PCAs well perfused or distal aspects without stenosis. Anatomic variants: None significant.  No intracranial aneurysm. MRA NECK FINDINGS Aortic arch: Visualized aortic arch normal in caliber with normal 3 vessel morphology. No hemodynamically significant stenosis seen about the origin of the great vessels. Right carotid system: Right common carotid artery patent from its origin to the bifurcation without stenosis. No significant atheromatous narrowing or irregularity about the right bifurcation. Right ICA patent distally without stenosis, evidence for dissection or occlusion. Left carotid system: Left CCA widely patent proximally. Eccentric plaque within the distal left CCA with associated stenosis of up to 50% by NASCET criteria (series 2, image 92). No significant atheromatous change about the left carotid bulb distally. Left ICA widely patent without stenosis, evidence for dissection or occlusion. Vertebral arteries: Both vertebral arteries arise from the subclavian arteries. No proximal subclavian artery stenosis. Right vertebral artery dominant. Vertebral arteries patent without stenosis, evidence for dissection or occlusion. Other: Patient is status post ACDF at C5-C7. Median sternotomy wires noted. IMPRESSION: MRI HEAD: 1. No acute intracranial abnormality. 2. Age-related cerebral atrophy with mild chronic small vessel ischemic disease. MRA HEAD: Negative intracranial MRA. No large vessel occlusion, hemodynamically significant  stenosis, or other acute vascular abnormality. MRA NECK: 1. Eccentric plaque within the distal left CCA with associated stenosis of up to 40-50% by NASCET criteria. 2. No hemodynamically significant stenosis about the right carotid artery system. 3. Wide patency of both vertebral arteries within the neck. Right vertebral artery dominant. Electronically Signed   By: Jeannine Boga [name]   On: 08/30/2021 22:01    Disposition: Discharge disposition: 01-Home or Self Care       Discharge Instructions     Call MD / Call 911   Complete by: As directed    If you experience chest pain or shortness of breath, CALL 911 and be transported to the hospital emergency room.  If you develope a fever above 101 F, pus (white drainage) or increased drainage or redness at the wound, or calf pain, call your surgeon's office.   Call MD / Call 911   Complete by: As directed    If you experience chest pain or shortness of breath, CALL 911 and be transported to the hospital emergency room.  If you develope a fever above 101 F, pus (white drainage) or increased drainage or redness at the wound, or calf pain, call your surgeon's office.   Constipation Prevention   Complete by: As directed    Drink plenty of fluids.  Prune juice may be helpful.  You may use a stool softener, such as Colace (over the counter) 100 mg twice a day.  Use MiraLax (over the counter) for constipation as needed.   Constipation Prevention   Complete by: As directed    Drink plenty of fluids.  Prune juice may be helpful.  You may use a stool softener, such as Colace (over the counter) 100 mg twice a day.  Use MiraLax (over the counter) for constipation as needed.   Diet - low sodium heart healthy   Complete by: As directed    Diet - low sodium heart healthy   Complete by: As directed    Discharge instructions   Complete by: As directed    INSTRUCTIONS AFTER JOINT REPLACEMENT   Remove items at home which could result in a  fall. This  includes throw rugs or furniture in walking pathways ICE to the affected joint every three hours while awake for 30 minutes at a time, for at least the first 3-5 days, and then as needed for pain and swelling.  Continue to use ice for pain and swelling. You may notice swelling that will progress down to the foot and ankle.  This is normal after surgery.  Elevate your leg when you are not up walking on it.   Continue to use the breathing machine you got in the hospital (incentive spirometer) which will help keep your temperature down.  It is common for your temperature to cycle up and down following surgery, especially at night when you are not up moving around and exerting yourself.  The breathing machine keeps your lungs expanded and your temperature down.   DIET:  As you were doing prior to hospitalization, we recommend a well-balanced diet.  DRESSING / WOUND CARE / SHOWERING  You may shower 3 days after surgery, but keep the wounds dry during showering.  You may use an occlusive plastic wrap (Press'n Seal for example), NO SOAKING/SUBMERGING IN THE BATHTUB.  If the bandage gets wet, change with a clean dry gauze.  If the incision gets wet, pat the wound dry with a clean towel.  ACTIVITY  Increase activity slowly as tolerated, but follow the weight bearing instructions below.   No driving for 6 weeks or until further direction given by your physician.  You cannot drive while taking narcotics.  No lifting or carrying greater than 10 lbs. until further directed by your surgeon. Avoid periods of inactivity such as sitting longer than an hour when not asleep. This helps prevent blood clots.  You may return to work once you are authorized by your doctor.     WEIGHT BEARING   Weight bearing as tolerated with assist device (walker, cane, etc) as directed, use it as long as suggested by your surgeon or therapist, typically at least 4-6 weeks.   EXERCISES  Results after joint replacement surgery  are often greatly improved when you follow the exercise, range of motion and muscle strengthening exercises prescribed by your doctor. Safety measures are also important to protect the joint from further injury. Any time any of these exercises cause you to have increased pain or swelling, decrease what you are doing until you are comfortable again and then slowly increase them. If you have problems or questions, call your caregiver or physical therapist for advice.   Rehabilitation is important following a joint replacement. After just a few days of immobilization, the muscles of the leg can become weakened and shrink (atrophy).  These exercises are designed to build up the tone and strength of the thigh and leg muscles and to improve motion. Often times heat used for twenty to thirty minutes before working out will loosen up your tissues and help with improving the range of motion but do not use heat for the first two weeks following surgery (sometimes heat can increase post-operative swelling).   These exercises can be done on a training (exercise) mat, on the floor, on a table or on a bed. Use whatever works the best and is most comfortable for you.    Use music or television while you are exercising so that the exercises are a pleasant break in your day. This will make your life better with the exercises acting as a break in your routine that you can look forward to.  Perform all exercises about fifteen times, three times per day or as directed.  You should exercise both the operative leg and the other leg as well.  Exercises include:   Quad Sets - Tighten up the muscle on the front of the thigh (Quad) and hold for 5-10 seconds.   Straight Leg Raises - With your knee straight (if you were given a brace, keep it on), lift the leg to 60 degrees, hold for 3 seconds, and slowly lower the leg.  Perform this exercise against resistance later as your leg gets stronger.  Leg Slides: Lying on your back, slowly  slide your foot toward your buttocks, bending your knee up off the floor (only go as far as is comfortable). Then slowly slide your foot back down until your leg is flat on the floor again.  Angel Wings: Lying on your back spread your legs to the side as far apart as you can without causing discomfort.  Hamstring Strength:  Lying on your back, push your heel against the floor with your leg straight by tightening up the muscles of your buttocks.  Repeat, but this time bend your knee to a comfortable angle, and push your heel against the floor.  You may put a pillow under the heel to make it more comfortable if necessary.   A rehabilitation program following joint replacement surgery can speed recovery and prevent re-injury in the future due to weakened muscles. Contact your doctor or a physical therapist for more information on knee rehabilitation.    CONSTIPATION  Constipation is defined medically as fewer than three stools per week and severe constipation as less than one stool per week.  Even if you have a regular bowel pattern at home, your normal regimen is likely to be disrupted due to multiple reasons following surgery.  Combination of anesthesia, postoperative narcotics, change in appetite and fluid intake all can affect your bowels.   YOU MUST use at least one of the following options; they are listed in order of increasing strength to get the job done.  They are all available over the counter, and you may need to use some, POSSIBLY even all of these options:    Drink plenty of fluids (prune juice may be helpful) and high fiber foods Colace 100 mg by mouth twice a day  Senokot for constipation as directed and as needed Dulcolax (bisacodyl), take with full glass of water  Miralax (polyethylene glycol) once or twice a day as needed.  If you have tried all these things and are unable to have a bowel movement in the first 3-4 days after surgery call either your surgeon or your primary doctor.     If you experience loose stools or diarrhea, hold the medications until you stool forms back up.  If your symptoms do not get better within 1 week or if they get worse, check with your doctor.  If you experience "the worst abdominal pain ever" or develop nausea or vomiting, please contact the office immediately for further recommendations for treatment.   ITCHING:  If you experience itching with your medications, try taking only a single pain pill, or even half a pain pill at a time.  You can also use Benadryl over the counter for itching or also to help with sleep.   TED HOSE STOCKINGS:  Use stockings on both legs until for at least 2 weeks or as directed by physician office. They may be removed at night for sleeping.  MEDICATIONS:  See  your medication summary on the "After Visit Summary" that nursing will review with you.  You may have some home medications which will be placed on hold until you complete the course of blood thinner medication.  It is important for you to complete the blood thinner medication as prescribed.  PRECAUTIONS:  If you experience chest pain or shortness of breath - call 911 immediately for transfer to the hospital emergency department.   If you develop a fever greater that 101 F, purulent drainage from wound, increased redness or drainage from wound, foul odor from the wound/dressing, or calf pain - CONTACT YOUR SURGEON.                                                   FOLLOW-UP APPOINTMENTS:  If you do not already have a post-op appointment, please call the office for an appointment to be seen by your surgeon.  Guidelines for how soon to be seen are listed in your "After Visit Summary", but are typically between 1-4 weeks after surgery.  OTHER INSTRUCTIONS:   Knee Replacement:  Do not place pillow under knee, focus on keeping the knee straight while resting. CPM instructions: 0-90 degrees, 2 hours in the morning, 2 hours in the afternoon, and 2 hours in the evening.  Place foam block, curve side up under heel at all times except when in CPM or when walking.  DO NOT modify, tear, cut, or change the foam block in any way.  POST-OPERATIVE OPIOID TAPER INSTRUCTIONS: It is important to wean off of your opioid medication as soon as possible. If you do not need pain medication after your surgery it is ok to stop day one. Opioids include: Codeine, Hydrocodone(Norco, Vicodin), Oxycodone(Percocet, oxycontin) and hydromorphone amongst others.  Long term and even short term use of opiods can cause: Increased pain response Dependence Constipation Depression Respiratory depression And more.  Withdrawal symptoms can include Flu like symptoms Nausea, vomiting And more Techniques to manage these symptoms Hydrate well Eat regular healthy meals Stay active Use relaxation techniques(deep breathing, meditating, yoga) Do Not substitute Alcohol to help with tapering If you have been on opioids for less than two weeks and do not have pain than it is ok to stop all together.  Plan to wean off of opioids This plan should start within one week post op of your joint replacement. Maintain the same interval or time between taking each dose and first decrease the dose.  Cut the total daily intake of opioids by one tablet each day Next start to increase the time between doses. The last dose that should be eliminated is the evening dose.     MAKE SURE YOU:  Understand these instructions.  Get help right away if you are not doing well or get worse.    Thank you for letting us be a part of your medical care team.  It is a privilege we respect greatly.  We hope these instructions will help you stay on track for a fast and full recovery!   Discharge instructions   Complete by: As directed    INSTRUCTIONS AFTER JOINT REPLACEMENT   Remove items at home which could result in a fall. This includes throw rugs or furniture in walking pathways ICE to the affected joint every  three hours while awake for 30 minutes at a time, for  at least the first 3-5 days, and then as needed for pain and swelling.  Continue to use ice for pain and swelling. You may notice swelling that will progress down to the foot and ankle.  This is normal after surgery.  Elevate your leg when you are not up walking on it.   Continue to use the breathing machine you got in the hospital (incentive spirometer) which will help keep your temperature down.  It is common for your temperature to cycle up and down following surgery, especially at night when you are not up moving around and exerting yourself.  The breathing machine keeps your lungs expanded and your temperature down.   DIET:  As you were doing prior to hospitalization, we recommend a well-balanced diet.  DRESSING / WOUND CARE / SHOWERING  You may shower 3 days after surgery, but keep the wounds dry during showering.  You may use an occlusive plastic wrap (Press'n Seal for example), NO SOAKING/SUBMERGING IN THE BATHTUB.  If the bandage gets wet, change with a clean dry gauze.  If the incision gets wet, pat the wound dry with a clean towel.  ACTIVITY  Increase activity slowly as tolerated, but follow the weight bearing instructions below.   No driving for 6 weeks or until further direction given by your physician.  You cannot drive while taking narcotics.  No lifting or carrying greater than 10 lbs. until further directed by your surgeon. Avoid periods of inactivity such as sitting longer than an hour when not asleep. This helps prevent blood clots.  You may return to work once you are authorized by your doctor.     WEIGHT BEARING   Weight bearing as tolerated with assist device (walker, cane, etc) as directed, use it as long as suggested by your surgeon or therapist, typically at least 4-6 weeks.   EXERCISES  Results after joint replacement surgery are often greatly improved when you follow the exercise, range of motion and muscle  strengthening exercises prescribed by your doctor. Safety measures are also important to protect the joint from further injury. Any time any of these exercises cause you to have increased pain or swelling, decrease what you are doing until you are comfortable again and then slowly increase them. If you have problems or questions, call your caregiver or physical therapist for advice.   Rehabilitation is important following a joint replacement. After just a few days of immobilization, the muscles of the leg can become weakened and shrink (atrophy).  These exercises are designed to build up the tone and strength of the thigh and leg muscles and to improve motion. Often times heat used for twenty to thirty minutes before working out will loosen up your tissues and help with improving the range of motion but do not use heat for the first two weeks following surgery (sometimes heat can increase post-operative swelling).   These exercises can be done on a training (exercise) mat, on the floor, on a table or on a bed. Use whatever works the best and is most comfortable for you.    Use music or television while you are exercising so that the exercises are a pleasant break in your day. This will make your life better with the exercises acting as a break in your routine that you can look forward to.   Perform all exercises about fifteen times, three times per day or as directed.  You should exercise both the operative leg and the other leg as well.  Exercises include:   Quad Sets - Tighten up the muscle on the front of the thigh (Quad) and hold for 5-10 seconds.   Straight Leg Raises - With your knee straight (if you were given a brace, keep it on), lift the leg to 60 degrees, hold for 3 seconds, and slowly lower the leg.  Perform this exercise against resistance later as your leg gets stronger.  Leg Slides: Lying on your back, slowly slide your foot toward your buttocks, bending your knee up off the floor (only go  as far as is comfortable). Then slowly slide your foot back down until your leg is flat on the floor again.  Angel Wings: Lying on your back spread your legs to the side as far apart as you can without causing discomfort.  Hamstring Strength:  Lying on your back, push your heel against the floor with your leg straight by tightening up the muscles of your buttocks.  Repeat, but this time bend your knee to a comfortable angle, and push your heel against the floor.  You may put a pillow under the heel to make it more comfortable if necessary.   A rehabilitation program following joint replacement surgery can speed recovery and prevent re-injury in the future due to weakened muscles. Contact your doctor or a physical therapist for more information on knee rehabilitation.    CONSTIPATION  Constipation is defined medically as fewer than three stools per week and severe constipation as less than one stool per week.  Even if you have a regular bowel pattern at home, your normal regimen is likely to be disrupted due to multiple reasons following surgery.  Combination of anesthesia, postoperative narcotics, change in appetite and fluid intake all can affect your bowels.   YOU MUST use at least one of the following options; they are listed in order of increasing strength to get the job done.  They are all available over the counter, and you may need to use some, POSSIBLY even all of these options:    Drink plenty of fluids (prune juice may be helpful) and high fiber foods Colace 100 mg by mouth twice a day  Senokot for constipation as directed and as needed Dulcolax (bisacodyl), take with full glass of water  Miralax (polyethylene glycol) once or twice a day as needed.  If you have tried all these things and are unable to have a bowel movement in the first 3-4 days after surgery call either your surgeon or your primary doctor.    If you experience loose stools or diarrhea, hold the medications until you  stool forms back up.  If your symptoms do not get better within 1 week or if they get worse, check with your doctor.  If you experience "the worst abdominal pain ever" or develop nausea or vomiting, please contact the office immediately for further recommendations for treatment.   ITCHING:  If you experience itching with your medications, try taking only a single pain pill, or even half a pain pill at a time.  You can also use Benadryl over the counter for itching or also to help with sleep.   TED HOSE STOCKINGS:  Use stockings on both legs until for at least 2 weeks or as directed by physician office. They may be removed at night for sleeping.  MEDICATIONS:  See your medication summary on the "After Visit Summary" that nursing will review with you.  You may have some home medications which will be placed on hold until  you complete the course of blood thinner medication.  It is important for you to complete the blood thinner medication as prescribed.  PRECAUTIONS:  If you experience chest pain or shortness of breath - call 911 immediately for transfer to the hospital emergency department.   If you develop a fever greater that 101 F, purulent drainage from wound, increased redness or drainage from wound, foul odor from the wound/dressing, or calf pain - CONTACT YOUR SURGEON.                                                   FOLLOW-UP APPOINTMENTS:  If you do not already have a post-op appointment, please call the office for an appointment to be seen by your surgeon.  Guidelines for how soon to be seen are listed in your "After Visit Summary", but are typically between 1-4 weeks after surgery.  OTHER INSTRUCTIONS:   Knee Replacement:  Do not place pillow under knee, focus on keeping the knee straight while resting. CPM instructions: 0-90 degrees, 2 hours in the morning, 2 hours in the afternoon, and 2 hours in the evening. Place foam block, curve side up under heel at all times except when in CPM or  when walking.  DO NOT modify, tear, cut, or change the foam block in any way.  POST-OPERATIVE OPIOID TAPER INSTRUCTIONS: It is important to wean off of your opioid medication as soon as possible. If you do not need pain medication after your surgery it is ok to stop day one. Opioids include: Codeine, Hydrocodone(Norco, Vicodin), Oxycodone(Percocet, oxycontin) and hydromorphone amongst others.  Long term and even short term use of opiods can cause: Increased pain response Dependence Constipation Depression Respiratory depression And more.  Withdrawal symptoms can include Flu like symptoms Nausea, vomiting And more Techniques to manage these symptoms Hydrate well Eat regular healthy meals Stay active Use relaxation techniques(deep breathing, meditating, yoga) Do Not substitute Alcohol to help with tapering If you have been on opioids for less than two weeks and do not have pain than it is ok to stop all together.  Plan to wean off of opioids This plan should start within one week post op of your joint replacement. Maintain the same interval or time between taking each dose and first decrease the dose.  Cut the total daily intake of opioids by one tablet each day Next start to increase the time between doses. The last dose that should be eliminated is the evening dose.     MAKE SURE YOU:  Understand these instructions.  Get help right away if you are not doing well or get worse.    Thank you for letting us be a part of your medical care team.  It is a privilege we respect greatly.  We hope these instructions will help you stay on track for a fast and full recovery!   Increase activity slowly as tolerated   Complete by: As directed    Increase activity slowly as tolerated   Complete by: As directed    Post-operative opioid taper instructions:   Complete by: As directed    POST-OPERATIVE OPIOID TAPER INSTRUCTIONS: It is important to wean off of your opioid medication as soon  as possible. If you do not need pain medication after your surgery it is ok to stop day one. Opioids include: Codeine, Hydrocodone(Norco, Vicodin),  Oxycodone(Percocet, oxycontin) and hydromorphone amongst others.  Long term and even short term use of opiods can cause: Increased pain response Dependence Constipation Depression Respiratory depression And more.  Withdrawal symptoms can include Flu like symptoms Nausea, vomiting And more Techniques to manage these symptoms Hydrate well Eat regular healthy meals Stay active Use relaxation techniques(deep breathing, meditating, yoga) Do Not substitute Alcohol to help with tapering If you have been on opioids for less than two weeks and do not have pain than it is ok to stop all together.  Plan to wean off of opioids This plan should start within one week post op of your joint replacement. Maintain the same interval or time between taking each dose and first decrease the dose.  Cut the total daily intake of opioids by one tablet each day Next start to increase the time between doses. The last dose that should be eliminated is the evening dose.      Post-operative opioid taper instructions:   Complete by: As directed    POST-OPERATIVE OPIOID TAPER INSTRUCTIONS: It is important to wean off of your opioid medication as soon as possible. If you do not need pain medication after your surgery it is ok to stop day one. Opioids include: Codeine, Hydrocodone(Norco, Vicodin), Oxycodone(Percocet, oxycontin) and hydromorphone amongst others.  Long term and even short term use of opiods can cause: Increased pain response Dependence Constipation Depression Respiratory depression And more.  Withdrawal symptoms can include Flu like symptoms Nausea, vomiting And more Techniques to manage these symptoms Hydrate well Eat regular healthy meals Stay active Use relaxation techniques(deep breathing, meditating, yoga) Do Not substitute Alcohol to  help with tapering If you have been on opioids for less than two weeks and do not have pain than it is ok to stop all together.  Plan to wean off of opioids This plan should start within one week post op of your joint replacement. Maintain the same interval or time between taking each dose and first decrease the dose.  Cut the total daily intake of opioids by one tablet each day Next start to increase the time between doses. The last dose that should be eliminated is the evening dose.           Follow-up Information     [name], MD. Schedule an appointment as soon as possible for a visit in 2 week(s).   Specialty: Orthopedic Surgery Contact information: [place] [address] [phone]                  Signed: Larwance Sachs Nastasha Reising 09/23/2021, 7:51 AM

## 2021-09-25 ENCOUNTER — Other Ambulatory Visit (HOSPITAL_BASED_OUTPATIENT_CLINIC_OR_DEPARTMENT_OTHER): Payer: Self-pay

## 2021-09-28 ENCOUNTER — Encounter: Payer: Self-pay | Admitting: Neurology

## 2021-09-29 ENCOUNTER — Other Ambulatory Visit: Payer: Self-pay | Admitting: Neurology

## 2021-09-29 ENCOUNTER — Other Ambulatory Visit (HOSPITAL_BASED_OUTPATIENT_CLINIC_OR_DEPARTMENT_OTHER): Payer: Self-pay

## 2021-09-29 ENCOUNTER — Telehealth: Payer: Self-pay | Admitting: Neurology

## 2021-09-29 DIAGNOSIS — R350 Frequency of micturition: Secondary | ICD-10-CM

## 2021-09-29 MED ORDER — TRAMADOL HCL 50 MG PO TABS
50.0000 mg | ORAL_TABLET | Freq: Four times a day (QID) | ORAL | 1 refills | Status: DC | PRN
  Filled 2021-09-29: qty 50, 13d supply, fill #0

## 2021-09-29 MED ORDER — SULFAMETHOXAZOLE-TRIMETHOPRIM 800-160 MG PO TABS
1.0000 | ORAL_TABLET | Freq: Two times a day (BID) | ORAL | 0 refills | Status: DC
  Filled 2021-09-29: qty 10, 5d supply, fill #0

## 2021-09-29 NOTE — Telephone Encounter (Signed)
I called the daughter.  The patient had surgery last week, was mildly confused coming out of the hospital.  The patient has had an undulating course of confusion at home, at times he is lucid and other times not.  The oxycodone is new, he may be hallucinating at times.  His wife died in 09/04/21, the patient is not recall this at all times.  We will stop the oxycodone.  The patient was on Ultram prior to the surgery, seemed to do well with this, we will use the Ultram in place of the oxycodone.  The patient is not on tizanidine currently.  The daughter is trying to minimize the use of pain medications, but if he does not get any pain medication he gets more agitated.  The patient sleeps better at night if the drug regimen keeps up with the pain before gets bad.  The patient has hydroxyzine to take if needed for agitation which seems to be helpful.

## 2021-09-29 NOTE — Telephone Encounter (Signed)
I called the daughter.  The patient apparently has had recent surgery and is now having confusion, possibly in a delirium state.  I called and left a message, I will call back later.

## 2021-09-30 ENCOUNTER — Telehealth: Payer: Self-pay

## 2021-09-30 NOTE — Telephone Encounter (Signed)
Transition Care Management Follow-up Telephone Call Date of discharge and from where: 09/23/21 from [facility] How have you been since you were released from the hospital? "Its just going to take a while for the swelling to go down" reports improving Any questions or concerns? No  Items Reviewed: Did the pt receive and understand the discharge instructions provided? Yes  Medications obtained and verified? Yes  Other? No  Any new allergies since your discharge? No  Dietary orders reviewed? yes Do you have support at home? Yes   Home Care and Equipment/Supplies: Were home health services ordered? yes If so, what is the name of the agency? [facility]  Has the agency set up a time to come to the patient's home? yes Were any new equipment or medical supplies ordered?  Yes: walker, states will get a shower chair when it is time and will discuss with physical therapist any addition equipment needs. What is the name of the medical supply agency? Received at the hospital Were you able to get the supplies/equipment? not applicable Do you have any questions related to the use of the equipment or supplies? No  Functional Questionnaire: (I = Independent and D = Dependent) ADLs: I  Bathing/Dressing- I  Meal Prep- assistance  Eating- I  Maintaining continence- I  Transferring/Ambulation- I with walker  Managing Meds- I  Follow up appointments reviewed:  PCP Hospital f/u appt confirmed? Yes  Scheduled to see Penni Homans on 10/05/21 @ 3:20. [place] f/u appt confirmed? Yes  Scheduled to see Dr. Rhona Raider on 09/21/21 @ 9 am. [name] 10/19/21 @ 9:20 Are transportation arrangements needed? No  If their condition worsens, is the pt aware to call PCP or go to the Emergency Dept.? Yes Was the patient provided with contact information for the PCP's office or ED? Yes Was to pt encouraged to call back with questions or concerns? Yes    Thea Silversmith, RN, MSN, BSN,  [place] Care Management Coordinator [phone]

## 2021-10-05 ENCOUNTER — Encounter: Payer: Self-pay | Admitting: Family Medicine

## 2021-10-05 ENCOUNTER — Ambulatory Visit (INDEPENDENT_AMBULATORY_CARE_PROVIDER_SITE_OTHER): Payer: Medicare Other | Admitting: Family Medicine

## 2021-10-05 ENCOUNTER — Other Ambulatory Visit: Payer: Self-pay

## 2021-10-05 ENCOUNTER — Other Ambulatory Visit (HOSPITAL_BASED_OUTPATIENT_CLINIC_OR_DEPARTMENT_OTHER): Payer: Self-pay

## 2021-10-05 ENCOUNTER — Ambulatory Visit (HOSPITAL_BASED_OUTPATIENT_CLINIC_OR_DEPARTMENT_OTHER)
Admission: RE | Admit: 2021-10-05 | Discharge: 2021-10-05 | Disposition: A | Discharge status: Home / Self Care | Payer: Medicare Other | Source: Ambulatory Visit | Attending: Family Medicine | Admitting: Family Medicine

## 2021-10-05 VITALS — BP 138/60 | HR 66 | Temp 98.3°F

## 2021-10-05 DIAGNOSIS — M1711 Unilateral primary osteoarthritis, right knee: Secondary | ICD-10-CM

## 2021-10-05 DIAGNOSIS — N184 Chronic kidney disease, stage 4 (severe): Secondary | ICD-10-CM | POA: Diagnosis not present

## 2021-10-05 DIAGNOSIS — F418 Other specified anxiety disorders: Secondary | ICD-10-CM

## 2021-10-05 DIAGNOSIS — R112 Nausea with vomiting, unspecified: Secondary | ICD-10-CM

## 2021-10-05 DIAGNOSIS — M25561 Pain in right knee: Secondary | ICD-10-CM

## 2021-10-05 DIAGNOSIS — I10 Essential (primary) hypertension: Secondary | ICD-10-CM | POA: Diagnosis not present

## 2021-10-05 MED ORDER — ONDANSETRON HCL 4 MG PO TABS
4.0000 mg | ORAL_TABLET | Freq: Three times a day (TID) | ORAL | 1 refills | Status: DC | PRN
  Filled 2021-10-05: qty 30, 10d supply, fill #0
  Filled 2022-01-06: qty 30, 10d supply, fill #1

## 2021-10-05 NOTE — Assessment & Plan Note (Signed)
He is tearful during visit and is still struggling with loosing his wife recently. Will do a VV in next 2-4 weeks to discuss next steps

## 2021-10-05 NOTE — Assessment & Plan Note (Signed)
Hydrate and monitor 

## 2021-10-05 NOTE — Progress Notes (Signed)
Subjective:   By signing my name below, I, [name] [name], attest that this documentation has been prepared under the direction and in the presence of [name] Lukes, MD. 10/05/2021    Patient ID: [name] [name], male    DOB: 1946/11/23, 75 y.o.   MRN: [id number]  Chief Complaint  Patient presents with   Medical Management of Chronic Issues    3 m f/u and has been sick since his knee surgey the 09/22/21    HPI Patient is in today for an office visit and 3 month f/u. He is accompanied by his son.  He reports that he has been feeling nauseous since his surgery on 09/22/2021. He reports that since his surgery, he has not been feeling well and his right knee has become more stiff and painful. It has also become harder to move. He was undergoing physical therapy and after his last session on 10/01/2021, the knee pain started. Before this visit, he was vomiting and mentions that today was the first day he had eaten in the past few days.   He was also having urinary symptoms last week and was treated with Bactrin. He is not having any UTI symptoms at this time. He denies abdominal pain.   Past Medical History:  Diagnosis Date   Acquired atrophy of thyroid 06/05/2013   Overview:  July 2014: controlled on synthroid 55mcg since 2012 May 2015: decreased to Synthroid 50 mcg  Last Assessment & Plan:  Pt doing well with most recent TSH within normal range.  Will continue current dosage of Synthroid 30mcg. Pt reminded to take medication on empty stomach. Will continue to monitor with periodic laboratory assessment in 3 months.    Anemia    hemoglobin 7.4, iron deficiency, January, 2011, 2 unit transfusion, endoscopy normal, capsule endoscopy February, 2011 no small bowel abnormalities.   Most likely source gastric erosions, followed by GI   Anxiety    Bradycardia    CAD (coronary artery disease)    A. CABG in 2000,status post cardiac cath in 2006, 2009 ....continued chest pain and SOB despite oral  medication adjestments including Ranexa. B. Cath November 2009/ mRCA - 2.75 x 23 Abbott Xience V drug-eluting stent ...11/26/2008 to distal  RCA leading to acute marginal.  C. Cath 07/2012 for CP - stable anatomy, med rx. d. cath 2015 and 05/30/2015 stable anatomy, consider Myoview if has CP again   Carotid artery disease ([place]) 08/01/2015   Doppler, May 29, 2015, 1-39% bilateral ICA    Cerebral ischemia    MRI November, 2010, chronic microvascular ischemia   CKD (chronic kidney disease), stage III ([place])    Concussion    Depression    Bipolar   Diabetes mellitus ([place])    Edema    Essential hypertension 06/05/2013   Overview:  July 2014: Controlled with Bystolic Sep 2542: Changed to atenolol.  Last Assessment & Plan:  Will change to atenolol for cost and follow Improved.  Medication compliance strongly encouraged BP: 116/64 mmHg    Overview:  July 2014: Controlled with Bystolic Sep 7062: Changed to atenolol.  Last Assessment & Plan:  Will change to atenolol for cost and follow   Falling episodes    these have occurred in the past and again recurring 2011   Family history of adverse reaction to anesthesia    "mother died during bypass surgery but not sure if it has to do with anesthesia"   Gastric ulcer    GERD (gastroesophageal reflux disease)  H/O medication noncompliance    Due to loss of insurance   H/O multiple concussions    Hard of hearing    Heart murmur    History of blood transfusion 12/20/2013   History of kidney stones    Hx of CABG    2000,  / one median sternotomy suture broken her chest x-ray November, 2010, no clinical significance   Hyperlipidemia    Hypertension    pt. denies   Iron deficiency anemia    Long term (current) use of anticoagulants [Z79.01] 07/20/2016   Low back pain 06/12/2009   Qualifier: Diagnosis of  By: [name]    Nephrolithiasis    Orthostasis    OSA (obstructive sleep apnea)    PAF (paroxysmal atrial fibrillation) ([place])    a. dx  2017, started on amiodarone/warfarin but patient intermittently noncompliant.   PSVT (paroxysmal supraventricular tachycardia) ([place])    RBBB 07/09/2009   Qualifier: Diagnosis of  By: [name], MD, [facility], [name]    RLS (restless legs syndrome) 09/19/2009   Qualifier: Diagnosis of  By: [name]   Overview:  July 2014: Controlled with Mirapex  Last Assessment & Plan:  Patient is doing well. Will continue current management and follow clinically.   Seizure disorder ([place]) 01/09/2017   Spondylosis    C5-6, C6-7 MRI 2010   Syncope 03/2016   TBI (traumatic brain injury) 2019   Thyroid disease    Tubulovillous adenoma of colon 2007   Vitamin D deficiency 05/17/2017   Wears glasses     Past Surgical History:  Procedure Laterality Date   ANTERIOR CERVICAL DECOMP/DISCECTOMY FUSION N/A 05/31/2016   Procedure: ANTERIOR CERVICAL DECOMPRESSION/DISCECTOMY FUSION CERVICAL FIVE-SIX,CERVICAL SIX-SEVEN;  Surgeon: [name], MD;  Location: [place] NEURO ORS;  Service: Neurosurgery;  Laterality: N/A;   CARDIAC CATHETERIZATION N/A 05/30/2015   Procedure: Left Heart Cath and Coronary Angiography;  Surgeon: [name], MD;  Location: [place] CV LAB;  Service: Cardiovascular;  Laterality: N/A;   COLONOSCOPY     CORONARY ARTERY BYPASS GRAFT     2000   ESOPHAGOGASTRODUODENOSCOPY     LEFT HEART CATH AND CORS/GRAFTS ANGIOGRAPHY N/A 12/15/2017   Procedure: LEFT HEART CATH AND CORS/GRAFTS ANGIOGRAPHY;  Surgeon: [name], MD;  Location: [place] CV LAB;  Service: Cardiovascular;  Laterality: N/A;   LEFT HEART CATHETERIZATION WITH CORONARY/GRAFT ANGIOGRAM N/A 08/01/2012   Procedure: LEFT HEART CATHETERIZATION WITH Beatrix Fetters;  Surgeon: [name], MD;  Location[facility] CATH LAB;  Service: Cardiovascular;  Laterality: N/A;   LEFT HEART CATHETERIZATION WITH CORONARY/GRAFT ANGIOGRAM N/A 01/03/2015   Procedure: LEFT HEART CATHETERIZATION WITH Beatrix Fetters;  Surgeon:  [name], MD;  Location: [facility] CATH LAB;  Service: Cardiovascular;  Laterality: N/A;   NASAL SEPTUM SURGERY     UP3   PERCUTANEOUS CORONARY STENT INTERVENTION (PCI-S)  10/2008   mRCA PCI  2.75 x 23 Abbott Xience V drug-eluting stent    RIGHT/LEFT HEART CATH AND CORONARY/GRAFT ANGIOGRAPHY N/A 01/07/2020   Procedure: RIGHT/LEFT HEART CATH AND CORONARY/GRAFT ANGIOGRAPHY;  Surgeon: [name], MD;  Location: [place] CV LAB;  Service: Cardiovascular;  Laterality: N/A;   TOTAL KNEE ARTHROPLASTY Right 09/22/2021   Procedure: RIGHT TOTAL KNEE ARTHROPLASTY;  Surgeon: [name], MD;  Location: [facility] ORS;  Service: Orthopedics;  Laterality: Right;   ULTRASOUND GUIDANCE FOR VASCULAR ACCESS  12/15/2017   Procedure: Ultrasound Guidance For Vascular Access;  Surgeon: [name], MD;  Location: [facility]  INVASIVE CV LAB;  Service: Cardiovascular;;    Family History  Problem Relation Age of Onset   Pancreatic cancer Brother    Diabetes Brother    Coronary artery disease Brother    Stroke Brother    Diabetes Brother    Diabetes Mother    Heart failure Mother    Heart failure Father    Hypothyroidism Brother    Coronary artery disease Brother    Other Brother        colon surgery   Heart attack Other        Nephew   Irregular heart beat Daughter    Cancer Maternal Grandmother        unknown    Colon cancer Neg Hx    Stomach cancer Neg Hx    Liver cancer Neg Hx    Rectal cancer Neg Hx    Esophageal cancer Neg Hx     Social History   Socioeconomic History   Marital status: Married    Spouse name: Not on file   Number of children: 4   Years of education: 38   Highest education level: Not on file  Occupational History   Occupation: retired  Tobacco Use   Smoking status: Never   Smokeless tobacco: Never  Vaping Use   Vaping Use: Never used  Substance and Sexual Activity   Alcohol use: No    Alcohol/week: 0.0 standard drinks    Comment: stopped drinking in 1998   Drug  use: No   Sexual activity: Not on file  Other Topics Concern   Not on file  Social History Narrative   Patient is right handed.   Patient does not drink caffeine.   Social Determinants of Health   Financial Resource Strain: Not on file  Food Insecurity: Not on file  Transportation Needs: Not on file  Physical Activity: Not on file  Stress: Not on file  Social Connections: Not on file  Intimate Partner Violence: Not on file    Outpatient Medications Prior to Visit  Medication Sig Dispense Refill   amLODipine (NORVASC) 5 MG tablet TAKE 1 TABLET BY MOUTH ONCE DAILY 90 tablet 2   apixaban (ELIQUIS) 5 MG TABS tablet Take 1 tablet (5 mg total) by mouth 2 (two) times daily. 60 tablet 5   aspirin EC 81 MG tablet Take 81 mg by mouth daily. Swallow whole.     atorvastatin (LIPITOR) 80 MG tablet Take 1 tablet (80 mg total) by mouth daily. (Patient taking differently: Take 80 mg by mouth at bedtime.) 90 tablet 3   busPIRone (BUSPAR) 7.5 MG tablet Take 1 tablet (7.5 mg total) by mouth 2 (two) times daily. 60 tablet 3   cetirizine (ZYRTEC) 10 MG tablet TAKE 1 TABLET (10 MG TOTAL) BY MOUTH DAILY. 100 tablet 2   Continuous Blood Gluc Sensor (FREESTYLE LIBRE 14 DAY SENSOR) MISC USE TO CHECK BLOOD SUGAR AND CHANGE EVERY 14 DAYS 6 each 3   divalproex (DEPAKOTE) 500 MG DR tablet Take 1 tablet (500 mg total) by mouth 2 (two) times daily. 60 tablet 5   famotidine (PEPCID) 20 MG tablet TAKE 1 TABLET BY MOUTH 2 TIMES DAILY 60 tablet 2   fenofibrate (TRICOR) 145 MG tablet TAKE 1 TABLET (145 MG TOTAL) BY MOUTH DAILY. 90 tablet 1   furosemide (LASIX) 20 MG tablet TAKE 1 TABLET BY MOUTH DAILY AS NEEDED FOR EDEMA 30 tablet 3   glucose blood (ACCU-CHEK AVIVA) test strip Use as directed to check blood  sugar 4 times daily.  DX E11.9 (Patient taking differently: Use as directed to check blood sugar 4 times daily.  DX E11.9) 200 each 6   hydrOXYzine (ATARAX/VISTARIL) 10 MG tablet Take 1 tablet (10 mg total) by mouth  3 (three) times daily as needed for anxiety or itching. 30 tablet 1   insulin glargine, 2 Unit Dial, (TOUJEO MAX SOLOSTAR) 300 UNIT/ML Solostar Pen Inject 130 Units into the skin every morning. 48 mL 3   Insulin Pen Needle 31G X 8 MM MISC use to inject insulin twice a day 100 each 3   isosorbide mononitrate (IMDUR) 120 MG 24 hr tablet TAKE 1 TABLET (120 MG TOTAL) BY MOUTH DAILY. 90 tablet 3   levothyroxine (SYNTHROID) 100 MCG tablet TAKE 1 TABLET (100 MCG TOTAL) BY MOUTH DAILY. 90 tablet 1   meclizine (ANTIVERT) 25 MG tablet Take 1 tablet (25 mg total) by mouth 3 (three) times daily as needed for dizziness. 30 tablet 0   metoprolol succinate (TOPROL-XL) 50 MG 24 hr tablet TAKE 1 AND 1/2 TABLETS EVERY DAY FOR A TOTAL OF 75 MG 135 tablet 3   nitroGLYCERIN (NITROSTAT) 0.4 MG SL tablet PLACE 1 TABLET UNDER THE TONGUE EVERY 5 MINUTES AS NEEDED FOR CHEST PAIN. 25 tablet 1   oxyCODONE (OXY IR/ROXICODONE) 5 MG immediate release tablet Take 1-2 tablets (5-10 mg total) by mouth every 6 (six) hours as needed for moderate pain or severe pain (post op pain). 40 tablet 0   pantoprazole (PROTONIX) 40 MG tablet TAKE 1 TABLET BY MOUTH EVERY DAY 90 tablet 3   pramipexole (MIRAPEX) 1 MG tablet TAKE 1/2 TO 1 TABLET AT BEDTIME (Patient taking differently: Take 0.5-1 mg by mouth at bedtime.) 90 tablet 0   ranolazine (RANEXA) 500 MG 12 hr tablet TAKE 1 TABLET BY MOUTH TWICE DAILY 60 tablet 5   sertraline (ZOLOFT) 100 MG tablet TAKE 1 TABLET BY MOUTH TWICE DAILY 180 tablet 1   sulfamethoxazole-trimethoprim (BACTRIM DS) 800-160 MG tablet Take 1 tablet by mouth 2 (two) times daily. 10 tablet 0   tiZANidine (ZANAFLEX) 4 MG tablet TAKE 1 TABLET BY MOUTH EVERY 6 HOURS AS NEEDED FOR MUSCLE SPASMS 40 tablet 2   traMADol (ULTRAM) 50 MG tablet Take 1 tablet (50 mg total) by mouth every 6 (six) hours as needed. 50 tablet 1   triamcinolone cream (KENALOG) 0.1 % Apply 1 application topically 2 (two) times daily. Use as needed for itchy  rash (Patient taking differently: Apply 1 application topically 2 (two) times daily as needed (itchy rash). Use as needed for itchy rash) 30 g 1   Vitamin D, Ergocalciferol, (DRISDOL) 1.25 MG (50000 UNIT) CAPS capsule TAKE 1 CAPSULE BY MOUTH EVERY 7 DAYS (Patient taking differently: Take 50,000 Units by mouth every Friday.) 4 capsule 4   albuterol (VENTOLIN HFA) 108 (90 Base) MCG/ACT inhaler Inhale 2 puffs into the lungs every 6 (six) hours as needed for wheezing or shortness of breath. (Patient not taking: Reported on 10/05/2021) 18 g 2   Diclofenac Sodium (PENNSAID) 2 % SOLN Place 1 application onto the skin 2 (two) times daily. (Patient taking differently: Place 1 application onto the skin 2 (two) times daily as needed (pain).) 112 g 2   hydrocortisone 2.5 % cream Apply on to the skin 2 (two) times daily. (Patient not taking: Reported on 10/05/2021) 30 g 1   Insulin Syringe-Needle U-100 31G X 5/16" 1 ML MISC USE AS DIRECTED WITH NOVOLIN 100 each 0   triamcinolone  cream (KENALOG) 0.5 % Apply 1 application on the skin twice a day (Patient not taking: Reported on 10/05/2021) 15 g 0   No facility-administered medications prior to visit.    Allergies  Allergen Reactions   Morphine Other (See Comments)    Hallucinations   Shellfish-Derived Products Itching    Review of Systems  Constitutional:  Negative for fever and malaise/fatigue.  HENT:  Negative for congestion.   Eyes:  Negative for redness.  Respiratory:  Negative for shortness of breath.   Cardiovascular:  Negative for chest pain, palpitations and leg swelling.  Gastrointestinal:  Positive for nausea and vomiting. Negative for abdominal pain and blood in stool.  Genitourinary:  Negative for dysuria and frequency.  Musculoskeletal:  Positive for joint pain (right knee). Negative for falls.  Skin:  Negative for rash.  Neurological:  Negative for dizziness, loss of consciousness and headaches.  Endo/Heme/Allergies:  Negative for  polydipsia.  Psychiatric/Behavioral:  Negative for depression. The patient is not nervous/anxious.       Objective:    Physical Exam Constitutional:      Appearance: Normal appearance. He is not ill-appearing.  HENT:     Head: Normocephalic and atraumatic.     Right Ear: Tympanic membrane, ear canal and external ear normal.     Left Ear: Tympanic membrane, ear canal and external ear normal.  Eyes:     Conjunctiva/sclera: Conjunctivae normal.  Cardiovascular:     Rate and Rhythm: Normal rate and regular rhythm.     Heart sounds: Normal heart sounds. No murmur heard. Pulmonary:     Breath sounds: Normal breath sounds. No wheezing.  Abdominal:     General: Bowel sounds are normal. There is no distension.     Palpations: Abdomen is soft.     Tenderness: There is no abdominal tenderness.     Hernia: No hernia is present.  Musculoskeletal:     Cervical back: Neck supple.  Lymphadenopathy:     Cervical: No cervical adenopathy.  Skin:    General: Skin is warm and dry.  Neurological:     Mental Status: He is alert and oriented to person, place, and time.  Psychiatric:        Behavior: Behavior normal.    BP 138/60   Pulse 66   Temp 98.3 F (36.8 C) (Oral)   SpO2 99%  Wt Readings from Last 3 Encounters:  09/22/21 205 lb 0.4 oz (93 kg)  09/18/21 204 lb (92.5 kg)  09/01/21 206 lb 3.2 oz (93.5 kg)    Diabetic Foot Exam - Simple   No data filed    Lab Results  Component Value Date   WBC 6.3 09/18/2021   HGB 13.2 09/18/2021   HCT 39.7 09/18/2021   PLT 190 09/18/2021   GLUCOSE 72 09/18/2021   CHOL 101 06/29/2021   TRIG 118.0 06/29/2021   HDL 31.40 (L) 06/29/2021   LDLDIRECT 77.0 08/20/2019   LDLCALC 46 06/29/2021   ALT 15 08/30/2021   AST 18 08/30/2021   NA 138 09/18/2021   K 4.6 09/18/2021   CL 104 09/18/2021   CREATININE 1.69 (H) 09/18/2021   BUN 23 09/18/2021   CO2 26 09/18/2021   TSH 3.42 06/29/2021   PSA 3.14 08/20/2019   INR 1.2 07/08/2021   HGBA1C 7.5  (A) 08/27/2021   MICROALBUR <0.7 05/14/2019    Lab Results  Component Value Date   TSH 3.42 06/29/2021   Lab Results  Component Value Date   WBC 6.3  09/18/2021   HGB 13.2 09/18/2021   HCT 39.7 09/18/2021   MCV 92.1 09/18/2021   PLT 190 09/18/2021   Lab Results  Component Value Date   NA 138 09/18/2021   K 4.6 09/18/2021   CO2 26 09/18/2021   GLUCOSE 72 09/18/2021   BUN 23 09/18/2021   CREATININE 1.69 (H) 09/18/2021   BILITOT 0.6 08/30/2021   ALKPHOS 43 08/30/2021   AST 18 08/30/2021   ALT 15 08/30/2021   PROT 6.6 08/30/2021   ALBUMIN 4.0 08/30/2021   CALCIUM 8.7 (L) 09/18/2021   ANIONGAP 8 09/18/2021   GFR 39.70 (L) 06/29/2021   Lab Results  Component Value Date   CHOL 101 06/29/2021   Lab Results  Component Value Date   HDL 31.40 (L) 06/29/2021   Lab Results  Component Value Date   LDLCALC 46 06/29/2021   Lab Results  Component Value Date   TRIG 118.0 06/29/2021   Lab Results  Component Value Date   CHOLHDL 3 06/29/2021   Lab Results  Component Value Date   HGBA1C 7.5 (A) 08/27/2021       Assessment & Plan:   Problem List Items Addressed This Visit     Depression with anxiety    He is tearful during visit and is still struggling with loosing his wife recently. Will do a VV in next 2-4 weeks to discuss next steps      Essential hypertension    Well controlled, no changes to meds. Encouraged heart healthy diet such as the DASH diet and exercise as tolerated.       OA (osteoarthritis) of knee    Had right TKR on 10/18 with Dr Loletha Carrow and underwent PT. He was doing well until late last week when he had a sudden increase in knee pain without any obvious injury. No warmth or redness but their is some swelling. He is encouraged to reach out to his surgeon if pain continues to escalate. Will check cmp and CBC as well as right knee xray.       Nausea & vomiting    He has experienced some mild nausea on and off since his surgery in the middle of  the month but only vomited here today. Sent in Ondansetron 4 mg prn. Maintain free fluids and simple carbs only over next 24 hours then progress diet as needed. Seek care if not able to keep fluids in      CKD (chronic kidney disease), stage IV (HCC)    Hydrate and monitor      Other Visit Diagnoses     Right knee pain, unspecified chronicity    -  Primary   Relevant Orders   CBC w/Diff   Comprehensive metabolic panel   Sedimentation rate   DG Knee Complete 4 Views Right       Meds ordered this encounter  Medications   ondansetron (ZOFRAN) 4 MG tablet    Sig: Take 1 tablet (4 mg total) by mouth every 8 (eight) hours as needed for nausea or vomiting.    Dispense:  30 tablet    Refill:  1    I,[name] [name],acting as a scribe for Penni Homans, MD.,have documented all relevant documentation on the behalf of Penni Homans, MD,as directed by  Penni Homans, MD while in the presence of Penni Homans, MD.   I, [name] Lukes, MD., personally preformed the services described in this documentation.  All medical record entries made by the scribe were at my direction  and in my presence.  I have reviewed the chart and discharge instructions (if applicable) and agree that the record reflects my personal performance and is accurate and complete. 10/05/2021

## 2021-10-05 NOTE — Assessment & Plan Note (Signed)
Well controlled, no changes to meds. Encouraged heart healthy diet such as the DASH diet and exercise as tolerated.  °

## 2021-10-05 NOTE — Assessment & Plan Note (Signed)
Had right TKR on 10/18 with Dr Loletha Carrow and underwent PT. He was doing well until late last week when he had a sudden increase in knee pain without any obvious injury. No warmth or redness but their is some swelling. He is encouraged to reach out to his surgeon if pain continues to escalate. Will check cmp and CBC as well as right knee xray.

## 2021-10-05 NOTE — Assessment & Plan Note (Signed)
He has experienced some mild nausea on and off since his surgery in the middle of the month but only vomited here today. Sent in Ondansetron 4 mg prn. Maintain free fluids and simple carbs only over next 24 hours then progress diet as needed. Seek care if not able to keep fluids in

## 2021-10-05 NOTE — Patient Instructions (Signed)
Acute Knee Pain, Adult Acute knee pain is sudden and may be caused by damage, swelling, or irritation of the muscles and tissues that support the knee. Pain may result from: A fall. An injury to the knee from twisting motions. A hit to the knee. Infection. Acute knee pain may go away on its own with time and rest. If it does not, your health care provider may order tests to find the cause of the pain. These may include: Imaging tests, such as an X-ray, MRI, CT scan, or ultrasound. Joint aspiration. In this test, fluid is removed from the knee and evaluated. Arthroscopy. In this test, a lighted tube is inserted into the knee and an image is projected onto a TV screen. Biopsy. In this test, a sample of tissue is removed from the body and studied under a microscope. Follow these instructions at home: If you have a knee sleeve or brace:  Wear the knee sleeve or brace as told by your health care provider. Remove it only as told by your health care provider. Loosen it if your toes tingle, become numb, or turn cold and blue. Keep it clean. If the knee sleeve or brace is not waterproof: Do not let it get wet. Cover it with a watertight covering when you take a bath or shower.  Activity Rest your knee. Do not do things that cause pain or make pain worse. Avoid high-impact activities or exercises, such as running, jumping rope, or doing jumping jacks. Work with a physical therapist to make a safe exercise program, as recommended by your health care provider. Do exercises as told by your physical therapist. Managing pain, stiffness, and swelling  If directed, put ice on the affected knee. To do this: If you have a removable knee sleeve or brace, remove it as told by your health care provider. Put ice in a plastic bag. Place a towel between your skin and the bag. Leave the ice on for 20 minutes, 2-3 times a day. Remove the ice if your skin turns bright red. This is very important. If you cannot  feel pain, heat, or cold, you have a greater risk of damage to the area. If directed, use an elastic bandage to put pressure (compression) on your injured knee. This may control swelling, give support, and help with discomfort. Raise (elevate) your knee above the level of your heart while you are sitting or lying down. Sleep with a pillow under your knee.  General instructions Take over-the-counter and prescription medicines only as told by your health care provider. Do not use any products that contain nicotine or tobacco, such as cigarettes, e-cigarettes, and chewing tobacco. If you need help quitting, ask your health care provider. If you are overweight, work with your health care provider and a dietitian to set a weight-loss goal that is healthy and reasonable for you. Extra weight can put pressure on your knee. Pay attention to any changes in your symptoms. Keep all follow-up visits. This is important. Contact a health care provider if: Your knee pain continues, changes, or gets worse. You have a fever along with knee pain. Your knee feels warm to the touch or is red. Your knee buckles or locks up. Get help right away if: Your knee swells, and the swelling becomes worse. You cannot move your knee. You have severe pain in your knee that cannot be managed with pain medicine. Summary Acute knee pain can be caused by a fall, an injury, an infection, or damage, swelling,   or irritation of the tissues that support your knee. Your health care provider may perform tests to find out the cause of the pain. Pay attention to any changes in your symptoms. Relieve your pain with rest, medicines, light activity, and the use of ice. Get help right away if your knee swells, you cannot move your knee, or you have severe pain that cannot be managed with medicine. This information is not intended to replace advice given to you by your health care provider. Make sure you discuss any questions you have with  your healthcare provider. Document Revised: 05/07/2020 Document Reviewed: 05/07/2020 Elsevier Patient Education  2022 [employer]  

## 2021-10-06 LAB — CBC WITH DIFFERENTIAL/PLATELET
Basophils Absolute: 0.1 10*3/uL (ref 0.0–0.1)
Basophils Relative: 0.8 % (ref 0.0–3.0)
Eosinophils Absolute: 0.3 10*3/uL (ref 0.0–0.7)
Eosinophils Relative: 2.9 % (ref 0.0–5.0)
HCT: 33.3 % — ABNORMAL LOW (ref 39.0–52.0)
Hemoglobin: 10.9 g/dL — ABNORMAL LOW (ref 13.0–17.0)
Lymphocytes Relative: 12.7 % (ref 12.0–46.0)
Lymphs Abs: 1.5 10*3/uL (ref 0.7–4.0)
MCHC: 32.7 g/dL (ref 30.0–36.0)
MCV: 92.3 fl (ref 78.0–100.0)
Monocytes Absolute: 0.7 10*3/uL (ref 0.1–1.0)
Monocytes Relative: 6 % (ref 3.0–12.0)
Neutro Abs: 9.1 10*3/uL — ABNORMAL HIGH (ref 1.4–7.7)
Neutrophils Relative %: 77.6 % — ABNORMAL HIGH (ref 43.0–77.0)
Platelets: 474 10*3/uL — ABNORMAL HIGH (ref 150.0–400.0)
RBC: 3.61 Mil/uL — ABNORMAL LOW (ref 4.22–5.81)
RDW: 15.1 % (ref 11.5–15.5)
WBC: 11.7 10*3/uL — ABNORMAL HIGH (ref 4.0–10.5)

## 2021-10-06 LAB — COMPREHENSIVE METABOLIC PANEL
ALT: 18 U/L (ref 0–53)
AST: 33 U/L (ref 0–37)
Albumin: 4.2 g/dL (ref 3.5–5.2)
Alkaline Phosphatase: 73 U/L (ref 39–117)
BUN: 24 mg/dL — ABNORMAL HIGH (ref 6–23)
CO2: 22 mEq/L (ref 19–32)
Calcium: 9.7 mg/dL (ref 8.4–10.5)
Chloride: 101 mEq/L (ref 96–112)
Creatinine, Ser: 1.99 mg/dL — ABNORMAL HIGH (ref 0.40–1.50)
GFR: 32.8 mL/min — ABNORMAL LOW (ref >60.00)
Glucose, Bld: 262 mg/dL — ABNORMAL HIGH (ref 70–99)
Potassium: 4.7 mEq/L (ref 3.5–5.1)
Sodium: 135 mEq/L (ref 135–145)
Total Bilirubin: 1.4 mg/dL — ABNORMAL HIGH (ref 0.2–1.2)
Total Protein: 7.5 g/dL (ref 6.0–8.3)

## 2021-10-06 LAB — SEDIMENTATION RATE: Sed Rate: 31 mm/hr — ABNORMAL HIGH (ref 0–20)

## 2021-10-08 DIAGNOSIS — Z471 Aftercare following joint replacement surgery: Secondary | ICD-10-CM | POA: Diagnosis not present

## 2021-10-08 DIAGNOSIS — I5032 Chronic diastolic (congestive) heart failure: Secondary | ICD-10-CM | POA: Diagnosis not present

## 2021-10-08 DIAGNOSIS — H9190 Unspecified hearing loss, unspecified ear: Secondary | ICD-10-CM | POA: Diagnosis not present

## 2021-10-08 DIAGNOSIS — N184 Chronic kidney disease, stage 4 (severe): Secondary | ICD-10-CM | POA: Diagnosis not present

## 2021-10-08 DIAGNOSIS — I4510 Unspecified right bundle-branch block: Secondary | ICD-10-CM | POA: Diagnosis not present

## 2021-10-08 DIAGNOSIS — I480 Paroxysmal atrial fibrillation: Secondary | ICD-10-CM | POA: Diagnosis not present

## 2021-10-08 DIAGNOSIS — Z96651 Presence of right artificial knee joint: Secondary | ICD-10-CM | POA: Diagnosis not present

## 2021-10-08 DIAGNOSIS — M4722 Other spondylosis with radiculopathy, cervical region: Secondary | ICD-10-CM | POA: Diagnosis not present

## 2021-10-08 DIAGNOSIS — E559 Vitamin D deficiency, unspecified: Secondary | ICD-10-CM | POA: Diagnosis not present

## 2021-10-08 DIAGNOSIS — K219 Gastro-esophageal reflux disease without esophagitis: Secondary | ICD-10-CM | POA: Diagnosis not present

## 2021-10-08 DIAGNOSIS — I2510 Atherosclerotic heart disease of native coronary artery without angina pectoris: Secondary | ICD-10-CM | POA: Diagnosis not present

## 2021-10-08 DIAGNOSIS — E034 Atrophy of thyroid (acquired): Secondary | ICD-10-CM | POA: Diagnosis not present

## 2021-10-08 DIAGNOSIS — G2581 Restless legs syndrome: Secondary | ICD-10-CM | POA: Diagnosis not present

## 2021-10-08 DIAGNOSIS — G4700 Insomnia, unspecified: Secondary | ICD-10-CM | POA: Diagnosis not present

## 2021-10-08 DIAGNOSIS — E785 Hyperlipidemia, unspecified: Secondary | ICD-10-CM | POA: Diagnosis not present

## 2021-10-08 DIAGNOSIS — D509 Iron deficiency anemia, unspecified: Secondary | ICD-10-CM | POA: Diagnosis not present

## 2021-10-08 DIAGNOSIS — G4733 Obstructive sleep apnea (adult) (pediatric): Secondary | ICD-10-CM | POA: Diagnosis not present

## 2021-10-08 DIAGNOSIS — I130 Hypertensive heart and chronic kidney disease with heart failure and stage 1 through stage 4 chronic kidney disease, or unspecified chronic kidney disease: Secondary | ICD-10-CM | POA: Diagnosis not present

## 2021-10-08 DIAGNOSIS — I471 Supraventricular tachycardia: Secondary | ICD-10-CM | POA: Diagnosis not present

## 2021-10-08 DIAGNOSIS — I6529 Occlusion and stenosis of unspecified carotid artery: Secondary | ICD-10-CM | POA: Diagnosis not present

## 2021-10-08 DIAGNOSIS — M4802 Spinal stenosis, cervical region: Secondary | ICD-10-CM | POA: Diagnosis not present

## 2021-10-09 ENCOUNTER — Other Ambulatory Visit (HOSPITAL_BASED_OUTPATIENT_CLINIC_OR_DEPARTMENT_OTHER): Payer: Self-pay

## 2021-10-09 DIAGNOSIS — I5032 Chronic diastolic (congestive) heart failure: Secondary | ICD-10-CM | POA: Diagnosis not present

## 2021-10-09 DIAGNOSIS — E559 Vitamin D deficiency, unspecified: Secondary | ICD-10-CM | POA: Diagnosis not present

## 2021-10-09 DIAGNOSIS — I4510 Unspecified right bundle-branch block: Secondary | ICD-10-CM | POA: Diagnosis not present

## 2021-10-09 DIAGNOSIS — I2510 Atherosclerotic heart disease of native coronary artery without angina pectoris: Secondary | ICD-10-CM | POA: Diagnosis not present

## 2021-10-09 DIAGNOSIS — M4802 Spinal stenosis, cervical region: Secondary | ICD-10-CM | POA: Diagnosis not present

## 2021-10-09 DIAGNOSIS — E785 Hyperlipidemia, unspecified: Secondary | ICD-10-CM | POA: Diagnosis not present

## 2021-10-09 DIAGNOSIS — M4722 Other spondylosis with radiculopathy, cervical region: Secondary | ICD-10-CM | POA: Diagnosis not present

## 2021-10-09 DIAGNOSIS — G2581 Restless legs syndrome: Secondary | ICD-10-CM | POA: Diagnosis not present

## 2021-10-09 DIAGNOSIS — E034 Atrophy of thyroid (acquired): Secondary | ICD-10-CM | POA: Diagnosis not present

## 2021-10-09 DIAGNOSIS — I6529 Occlusion and stenosis of unspecified carotid artery: Secondary | ICD-10-CM | POA: Diagnosis not present

## 2021-10-09 DIAGNOSIS — H9190 Unspecified hearing loss, unspecified ear: Secondary | ICD-10-CM | POA: Diagnosis not present

## 2021-10-09 DIAGNOSIS — N184 Chronic kidney disease, stage 4 (severe): Secondary | ICD-10-CM | POA: Diagnosis not present

## 2021-10-09 DIAGNOSIS — Z96651 Presence of right artificial knee joint: Secondary | ICD-10-CM | POA: Diagnosis not present

## 2021-10-09 DIAGNOSIS — D509 Iron deficiency anemia, unspecified: Secondary | ICD-10-CM | POA: Diagnosis not present

## 2021-10-09 DIAGNOSIS — I471 Supraventricular tachycardia: Secondary | ICD-10-CM | POA: Diagnosis not present

## 2021-10-09 DIAGNOSIS — Z471 Aftercare following joint replacement surgery: Secondary | ICD-10-CM | POA: Diagnosis not present

## 2021-10-09 DIAGNOSIS — G4700 Insomnia, unspecified: Secondary | ICD-10-CM | POA: Diagnosis not present

## 2021-10-09 DIAGNOSIS — I130 Hypertensive heart and chronic kidney disease with heart failure and stage 1 through stage 4 chronic kidney disease, or unspecified chronic kidney disease: Secondary | ICD-10-CM | POA: Diagnosis not present

## 2021-10-09 DIAGNOSIS — G4733 Obstructive sleep apnea (adult) (pediatric): Secondary | ICD-10-CM | POA: Diagnosis not present

## 2021-10-09 DIAGNOSIS — K219 Gastro-esophageal reflux disease without esophagitis: Secondary | ICD-10-CM | POA: Diagnosis not present

## 2021-10-09 DIAGNOSIS — I480 Paroxysmal atrial fibrillation: Secondary | ICD-10-CM | POA: Diagnosis not present

## 2021-10-09 MED FILL — Isosorbide Mononitrate Tab ER 24HR 120 MG: ORAL | 90 days supply | Qty: 90 | Fill #1 | Status: AC

## 2021-10-12 DIAGNOSIS — E785 Hyperlipidemia, unspecified: Secondary | ICD-10-CM | POA: Diagnosis not present

## 2021-10-12 DIAGNOSIS — I4510 Unspecified right bundle-branch block: Secondary | ICD-10-CM | POA: Diagnosis not present

## 2021-10-12 DIAGNOSIS — I6529 Occlusion and stenosis of unspecified carotid artery: Secondary | ICD-10-CM | POA: Diagnosis not present

## 2021-10-12 DIAGNOSIS — I480 Paroxysmal atrial fibrillation: Secondary | ICD-10-CM | POA: Diagnosis not present

## 2021-10-12 DIAGNOSIS — G4700 Insomnia, unspecified: Secondary | ICD-10-CM | POA: Diagnosis not present

## 2021-10-12 DIAGNOSIS — D509 Iron deficiency anemia, unspecified: Secondary | ICD-10-CM | POA: Diagnosis not present

## 2021-10-12 DIAGNOSIS — H9190 Unspecified hearing loss, unspecified ear: Secondary | ICD-10-CM | POA: Diagnosis not present

## 2021-10-12 DIAGNOSIS — I130 Hypertensive heart and chronic kidney disease with heart failure and stage 1 through stage 4 chronic kidney disease, or unspecified chronic kidney disease: Secondary | ICD-10-CM | POA: Diagnosis not present

## 2021-10-12 DIAGNOSIS — Z96651 Presence of right artificial knee joint: Secondary | ICD-10-CM | POA: Diagnosis not present

## 2021-10-12 DIAGNOSIS — E034 Atrophy of thyroid (acquired): Secondary | ICD-10-CM | POA: Diagnosis not present

## 2021-10-12 DIAGNOSIS — G4733 Obstructive sleep apnea (adult) (pediatric): Secondary | ICD-10-CM | POA: Diagnosis not present

## 2021-10-12 DIAGNOSIS — K219 Gastro-esophageal reflux disease without esophagitis: Secondary | ICD-10-CM | POA: Diagnosis not present

## 2021-10-12 DIAGNOSIS — Z471 Aftercare following joint replacement surgery: Secondary | ICD-10-CM | POA: Diagnosis not present

## 2021-10-12 DIAGNOSIS — G2581 Restless legs syndrome: Secondary | ICD-10-CM | POA: Diagnosis not present

## 2021-10-12 DIAGNOSIS — N184 Chronic kidney disease, stage 4 (severe): Secondary | ICD-10-CM | POA: Diagnosis not present

## 2021-10-12 DIAGNOSIS — I5032 Chronic diastolic (congestive) heart failure: Secondary | ICD-10-CM | POA: Diagnosis not present

## 2021-10-12 DIAGNOSIS — E559 Vitamin D deficiency, unspecified: Secondary | ICD-10-CM | POA: Diagnosis not present

## 2021-10-12 DIAGNOSIS — I2510 Atherosclerotic heart disease of native coronary artery without angina pectoris: Secondary | ICD-10-CM | POA: Diagnosis not present

## 2021-10-12 DIAGNOSIS — M4802 Spinal stenosis, cervical region: Secondary | ICD-10-CM | POA: Diagnosis not present

## 2021-10-12 DIAGNOSIS — M4722 Other spondylosis with radiculopathy, cervical region: Secondary | ICD-10-CM | POA: Diagnosis not present

## 2021-10-12 DIAGNOSIS — I471 Supraventricular tachycardia: Secondary | ICD-10-CM | POA: Diagnosis not present

## 2021-10-14 ENCOUNTER — Other Ambulatory Visit (HOSPITAL_BASED_OUTPATIENT_CLINIC_OR_DEPARTMENT_OTHER): Payer: Self-pay

## 2021-10-15 DIAGNOSIS — I130 Hypertensive heart and chronic kidney disease with heart failure and stage 1 through stage 4 chronic kidney disease, or unspecified chronic kidney disease: Secondary | ICD-10-CM | POA: Diagnosis not present

## 2021-10-15 DIAGNOSIS — I480 Paroxysmal atrial fibrillation: Secondary | ICD-10-CM | POA: Diagnosis not present

## 2021-10-15 DIAGNOSIS — M4722 Other spondylosis with radiculopathy, cervical region: Secondary | ICD-10-CM | POA: Diagnosis not present

## 2021-10-15 DIAGNOSIS — I4510 Unspecified right bundle-branch block: Secondary | ICD-10-CM | POA: Diagnosis not present

## 2021-10-15 DIAGNOSIS — E559 Vitamin D deficiency, unspecified: Secondary | ICD-10-CM | POA: Diagnosis not present

## 2021-10-15 DIAGNOSIS — I2510 Atherosclerotic heart disease of native coronary artery without angina pectoris: Secondary | ICD-10-CM | POA: Diagnosis not present

## 2021-10-15 DIAGNOSIS — N184 Chronic kidney disease, stage 4 (severe): Secondary | ICD-10-CM | POA: Diagnosis not present

## 2021-10-15 DIAGNOSIS — K219 Gastro-esophageal reflux disease without esophagitis: Secondary | ICD-10-CM | POA: Diagnosis not present

## 2021-10-15 DIAGNOSIS — G4733 Obstructive sleep apnea (adult) (pediatric): Secondary | ICD-10-CM | POA: Diagnosis not present

## 2021-10-15 DIAGNOSIS — I471 Supraventricular tachycardia: Secondary | ICD-10-CM | POA: Diagnosis not present

## 2021-10-15 DIAGNOSIS — M4802 Spinal stenosis, cervical region: Secondary | ICD-10-CM | POA: Diagnosis not present

## 2021-10-15 DIAGNOSIS — I6529 Occlusion and stenosis of unspecified carotid artery: Secondary | ICD-10-CM | POA: Diagnosis not present

## 2021-10-15 DIAGNOSIS — G2581 Restless legs syndrome: Secondary | ICD-10-CM | POA: Diagnosis not present

## 2021-10-15 DIAGNOSIS — E034 Atrophy of thyroid (acquired): Secondary | ICD-10-CM | POA: Diagnosis not present

## 2021-10-15 DIAGNOSIS — Z96651 Presence of right artificial knee joint: Secondary | ICD-10-CM | POA: Diagnosis not present

## 2021-10-15 DIAGNOSIS — G4700 Insomnia, unspecified: Secondary | ICD-10-CM | POA: Diagnosis not present

## 2021-10-15 DIAGNOSIS — D509 Iron deficiency anemia, unspecified: Secondary | ICD-10-CM | POA: Diagnosis not present

## 2021-10-15 DIAGNOSIS — I5032 Chronic diastolic (congestive) heart failure: Secondary | ICD-10-CM | POA: Diagnosis not present

## 2021-10-15 DIAGNOSIS — H9190 Unspecified hearing loss, unspecified ear: Secondary | ICD-10-CM | POA: Diagnosis not present

## 2021-10-15 DIAGNOSIS — Z471 Aftercare following joint replacement surgery: Secondary | ICD-10-CM | POA: Diagnosis not present

## 2021-10-15 DIAGNOSIS — E785 Hyperlipidemia, unspecified: Secondary | ICD-10-CM | POA: Diagnosis not present

## 2021-10-16 ENCOUNTER — Telehealth: Payer: Self-pay | Admitting: Family Medicine

## 2021-10-16 NOTE — Telephone Encounter (Signed)
Error

## 2021-10-18 NOTE — Progress Notes (Deleted)
Cardiology Office Note   Date:  10/18/2021   ID:  [name] [name], DOB Jul 12, 1946, MRN [id number]  PCP:  [name] Lukes, MD    No chief complaint on file.  CAD  Wt Readings from Last 3 Encounters:  09/22/21 205 lb 0.4 oz (93 kg)  09/18/21 204 lb (92.5 kg)  09/01/21 206 lb 3.2 oz (93.5 kg)       History of Present Illness: [name] [name] is a 75 y.o. male   with history of CAD (CABG 2000, stent to RCA 2009), paroxysmal atrial fibrillation, PSVT, CKD III, noncompliance, orthostatic hypotension, iron deficiency anemia, prior gastric erosions/ulcer, ?chronic diastolic CHF (no diastolic dysfunction on echo and normal BNP), bipolar depression, DM, HTN, HLD, kidney stones, OSA, RBBB, deconditoning, thyroid disease, spondylosis and back pain.   He has prior history of chest discomfort, palpitations and dyspnea requiring med titration and repeat caths/monitors over the years. His last PCI was in 2009. A CPX in 2011 was unrevealing, felt to reflect deconditioning and perhaps mild chronotropic incompetence. He had caths in 2015, 2016, and 12/2017 showing stable anatomy with patent bypass grafts and medical therapy recommended with possible component of microvascular angina. In 03/2016 he was admitted near syncope and transient confusion. MRI was negative for stroke. EEG was normal. He was seen by neurology. There was no significant carotid artery stenosis on CTA. Echocardiogram demonstrated normal LV function with mild diastolic dysfunction. No arrhythmias noted on telemetry. TIA could not be ruled out. Aspirin was changed to Plavix. He was bradycardic and his beta blocker dose was reduced. He was readmitted 03/2016 with lightheadedness, confusion, slurred speech and right facial droop, left hand tremors, and intermittent hypotension. There was also reported history of occasional episodes of dizziness, slurred speech, confusion followed by lethargy. Patient would also fall asleep easily without  memory of the event. He has since been diagnosed with narcolepsy. His metoprolol was discontinued at one point but eventually restarted. He also required midodrine previously for orthostasis although does not appear to currently be on this. A monitor did demonstrate atrial fib/flutter so he was changed from Plavix to warfarin. Metoprolol was subsequently increased. In 09/2016 he was started on amiodarone. There does appear to be a history of compliance issues - notably including taking midodrine incorrectly, not reducing amiodarone as instructed (was on 400mg  daily for a while), or - per last OV 06/2018 - not filling amiodarone since 2017. At last OV he was reporting increased palpitations and decreased exercise tolerance with DOE. He was in NSR. BNP was normal. 2D echo 07/2018 showed EF 81-44%, normal diastolic function, mild LAE. Event monitor showed NSR with PACs/PVCs and short runs of SVT, lowest HR 45bpm. Carotid duplex 07/2018 showed minimal plaque without significant stenosis. He was supposed to follow-up 1 month after that visit but never came. In 11/2018 he was admitted for seizures suspected due to noncompliance for financial reasons. He also reported chest pain in setting of noncompliance.   Repeat cath in 2021.  Stable anatomy and normal right heart pressures. His SHOB was better after leaving the hospital.     THe [facility]  came back, specifically with bending down and standing.  Had some left facial swelling in 4/21 which we thought might be from salivary gland obstruction. Marland Kitchen  CRI stable at discharge.   In 2021, he had a flu like illness, COVID negative, and he has been gradually getting more energy.   Isosorbide has helped with chest pain. Below 120 mg,  he has more chest pain.    Needs TKR along with cardiac clearance.  Activity was limited by knee pain.       Past Medical History:  Diagnosis Date   Acquired atrophy of thyroid 06/05/2013   Overview:  July 2014: controlled on synthroid  46mcg since 2012 May 2015: decreased to Synthroid 50 mcg  Last Assessment & Plan:  Pt doing well with most recent TSH within normal range.  Will continue current dosage of Synthroid 24mcg. Pt reminded to take medication on empty stomach. Will continue to monitor with periodic laboratory assessment in 3 months.    Anemia    hemoglobin 7.4, iron deficiency, January, 2011, 2 unit transfusion, endoscopy normal, capsule endoscopy February, 2011 no small bowel abnormalities.   Most likely source gastric erosions, followed by GI   Anxiety    Bradycardia    CAD (coronary artery disease)    A. CABG in 2000,status post cardiac cath in 2006, 2009 ....continued chest pain and SOB despite oral medication adjestments including Ranexa. B. Cath November 2009/ mRCA - 2.75 x 23 Abbott Xience V drug-eluting stent ...11/26/2008 to distal  RCA leading to acute marginal.  C. Cath 07/2012 for CP - stable anatomy, med rx. d. cath 2015 and 05/30/2015 stable anatomy, consider Myoview if has CP again   Carotid artery disease ([place]) 08/01/2015   Doppler, May 29, 2015, 1-39% bilateral ICA    Cerebral ischemia    MRI November, 2010, chronic microvascular ischemia   CKD (chronic kidney disease), stage III ([place])    Concussion    Depression    Bipolar   Diabetes mellitus ([name] [name])    Edema    Essential hypertension 06/05/2013   Overview:  July 2014: Controlled with Bystolic Sep 0960: Changed to atenolol.  Last Assessment & Plan:  Will change to atenolol for cost and follow Improved.  Medication compliance strongly encouraged BP: 116/64 mmHg    Overview:  July 2014: Controlled with Bystolic Sep 4540: Changed to atenolol.  Last Assessment & Plan:  Will change to atenolol for cost and follow   Falling episodes    these have occurred in the past and again recurring 2011   Family history of adverse reaction to anesthesia    "[name] [name] died during bypass surgery but not sure if it has to do with anesthesia"   Gastric ulcer    GERD  (gastroesophageal reflux disease)    H/O medication noncompliance    Due to loss of insurance   H/O multiple concussions    Hard of hearing    Heart murmur    History of blood transfusion 12/20/2013   History of kidney stones    Hx of CABG    2000,  / one median sternotomy suture broken her chest x-ray November, 2010, no clinical significance   Hyperlipidemia    Hypertension    pt. denies   Iron deficiency anemia    Long term (current) use of anticoagulants [Z79.01] 07/20/2016   Low back pain 06/12/2009   Qualifier: Diagnosis of  By: [name]    Nephrolithiasis    Orthostasis    OSA (obstructive sleep apnea)    PAF (paroxysmal atrial fibrillation) ([place])    a. dx 2017, started on amiodarone/warfarin but patient intermittently noncompliant.   PSVT (paroxysmal supraventricular tachycardia) ([place])    RBBB 07/09/2009   Qualifier: Diagnosis of  By: [name], MD, [facility], [name]    RLS (restless legs syndrome) 09/19/2009   Qualifier: Diagnosis of  By: [name]   Overview:  July 2014: Controlled with Mirapex  Last Assessment & Plan:  Patient is doing well. Will continue current management and follow clinically.   Seizure disorder ([place]) 01/09/2017   Spondylosis    C5-6, C6-7 MRI 2010   Syncope 03/2016   TBI (traumatic brain injury) 2019   Thyroid disease    Tubulovillous adenoma of colon 2007   Vitamin D deficiency 05/17/2017   Wears glasses     Past Surgical History:  Procedure Laterality Date   ANTERIOR CERVICAL DECOMP/DISCECTOMY FUSION N/A 05/31/2016   Procedure: ANTERIOR CERVICAL DECOMPRESSION/DISCECTOMY FUSION CERVICAL FIVE-SIX,CERVICAL SIX-SEVEN;  Surgeon: [name], MD;  Location: [place] [facility]URO ORS;  Service: Neurosurgery;  Laterality: N/A;   CARDIAC CATHETERIZATION N/A 05/30/2015   Procedure: Left Heart Cath and Coronary Angiography;  Surgeon: [name], MD;  Location: [place] CV LAB;  Service: Cardiovascular;  Laterality: N/A;   COLONOSCOPY      CORONARY ARTERY BYPASS GRAFT     2000   ESOPHAGOGASTRODUODENOSCOPY     LEFT HEART CATH AND CORS/GRAFTS ANGIOGRAPHY N/A 12/15/2017   Procedure: LEFT HEART CATH AND CORS/GRAFTS ANGIOGRAPHY;  Surgeon: [name], MD;  Location: [place] CV LAB;  Service: Cardiovascular;  Laterality: N/A;   LEFT HEART CATHETERIZATION WITH CORONARY/GRAFT ANGIOGRAM N/A 08/01/2012   Procedure: LEFT HEART CATHETERIZATION WITH Beatrix Fetters;  Surgeon: [name], MD;  Location: [facility] C[facility] LAB;  Service: Cardiovascular;  Laterality: N/A;   LEFT HEART CATHETERIZATION WITH CORONARY/GRAFT ANGIOGRAM N/A 01/03/2015   Procedure: LEFT HEART CATHETERIZATION WITH Beatrix Fetters;  Surgeon: [name], MD;  Location: [facility] CATH LAB;  Service: Cardiovascular;  Laterality: N/A;   NASAL SEPTUM SURGERY     UP3   PERCUTANEOUS CORONARY STENT INTERVENTION (PCI-S)  10/2008   mRCA PCI  2.75 x 23 Abbott Xience V drug-eluting stent    RIGHT/LEFT HEART CATH AND CORONARY/GRAFT ANGIOGRAPHY N/A 01/07/2020   Procedure: RIGHT/LEFT HEART CATH AND CORONARY/GRAFT ANGIOGRAPHY;  Surgeon: [name], MD;  Location: [place] CV LAB;  Service: Cardiovascular;  Laterality: N/A;   TOTAL KNEE ARTHROPLASTY Right 09/22/2021   Procedure: RIGHT TOTAL KNEE ARTHROPLASTY;  Surgeon: [name], MD;  Location: [facility] ORS;  Service: Orthopedics;  Laterality: Right;   ULTRASOUND GUIDANCE FOR VASCULAR ACCESS  12/15/2017   Procedure: Ultrasound Guidance For Vascular Access;  Surgeon: [name], MD;  Location: [place] CV LAB;  Service: Cardiovascular;;     Current Outpatient Medications  Medication Sig Dispense Refill   albuterol (VENTOLIN HFA) 108 (90 Base) MCG/ACT inhaler Inhale 2 puffs into the lungs every 6 (six) hours as needed for wheezing or shortness of breath. (Patient not taking: Reported on 10/05/2021) 18 g 2   amLODipine (NORVASC) 5 MG tablet TAKE 1 TABLET BY MOUTH ONCE DAILY 90 tablet 2   apixaban  (ELIQUIS) 5 MG TABS tablet Take 1 tablet (5 mg total) by mouth 2 (two) times daily. 60 tablet 5   aspirin EC 81 MG tablet Take 81 mg by mouth daily. Swallow whole.     atorvastatin (LIPITOR) 80 MG tablet Take 1 tablet (80 mg total) by mouth daily. (Patient taking differently: Take 80 mg by mouth at bedtime.) 90 tablet 3   busPIRone (BUSPAR) 7.5 MG tablet Take 1 tablet (7.5 mg total) by mouth 2 (two) times daily. 60 tablet 3   cetirizine (ZYRTEC) 10 MG tablet TAKE 1 TABLET (10 MG TOTAL) BY MOUTH DAILY. 100 tablet 2   Continuous  Blood Gluc Sensor (FREESTYLE LIBRE 14 DAY SENSOR) MISC USE TO CHECK BLOOD SUGAR AND CHANGE EVERY 14 DAYS 6 each 3   Diclofenac Sodium (PENNSAID) 2 % SOLN Place 1 application onto the skin 2 (two) times daily. (Patient taking differently: Place 1 application onto the skin 2 (two) times daily as needed (pain).) 112 g 2   divalproex (DEPAKOTE) 500 MG DR tablet Take 1 tablet (500 mg total) by mouth 2 (two) times daily. 60 tablet 5   famotidine (PEPCID) 20 MG tablet TAKE 1 TABLET BY MOUTH 2 TIMES DAILY 60 tablet 2   fenofibrate (TRICOR) 145 MG tablet TAKE 1 TABLET (145 MG TOTAL) BY MOUTH DAILY. 90 tablet 1   furosemide (LASIX) 20 MG tablet TAKE 1 TABLET BY MOUTH DAILY AS NEEDED FOR EDEMA 30 tablet 3   glucose blood (ACCU-CHEK AVIVA) test strip Use as directed to check blood sugar 4 times daily.  DX E11.9 (Patient taking differently: Use as directed to check blood sugar 4 times daily.  DX E11.9) 200 each 6   hydrocortisone 2.5 % cream Apply on to the skin 2 (two) times daily. (Patient not taking: Reported on 10/05/2021) 30 g 1   hydrOXYzine (ATARAX/VISTARIL) 10 MG tablet Take 1 tablet (10 mg total) by mouth 3 (three) times daily as needed for anxiety or itching. 30 tablet 1   insulin glargine, 2 Unit Dial, (TOUJEO MAX SOLOSTAR) 300 UNIT/ML Solostar Pen Inject 130 Units into the skin every morning. 48 mL 3   Insulin Pen Needle 31G X 8 MM MISC use to inject insulin twice a day 100 each  3   Insulin Syringe-Needle U-100 31G X 5/16" 1 ML MISC USE AS DIRECTED WITH NOVOLIN 100 each 0   isosorbide mononitrate (IMDUR) 120 MG 24 hr tablet TAKE 1 TABLET (120 MG TOTAL) BY MOUTH DAILY. 90 tablet 3   levothyroxine (SYNTHROID) 100 MCG tablet TAKE 1 TABLET (100 MCG TOTAL) BY MOUTH DAILY. 90 tablet 1   meclizine (ANTIVERT) 25 MG tablet Take 1 tablet (25 mg total) by mouth 3 (three) times daily as needed for dizziness. 30 tablet 0   metoprolol succinate (TOPROL-XL) 50 MG 24 hr tablet TAKE 1 AND 1/2 TABLETS EVERY DAY FOR A TOTAL OF 75 MG 135 tablet 3   nitroGLYCERIN (NITROSTAT) 0.4 MG SL tablet PLACE 1 TABLET UNDER THE TONGUE EVERY 5 MINUTES AS NEEDED FOR CHEST PAIN. 25 tablet 1   ondansetron (ZOFRAN) 4 MG tablet Take 1 tablet (4 mg total) by mouth every 8 (eight) hours as needed for nausea or vomiting. 30 tablet 1   oxyCODONE (OXY IR/ROXICODONE) 5 MG immediate release tablet Take 1-2 tablets (5-10 mg total) by mouth every 6 (six) hours as needed for moderate pain or severe pain (post op pain). 40 tablet 0   pantoprazole (PROTONIX) 40 MG tablet TAKE 1 TABLET BY MOUTH EVERY DAY 90 tablet 3   pramipexole (MIRAPEX) 1 MG tablet TAKE 1/2 TO 1 TABLET AT BEDTIME (Patient taking differently: Take 0.5-1 mg by mouth at bedtime.) 90 tablet 0   ranolazine (RANEXA) 500 MG 12 hr tablet TAKE 1 TABLET BY MOUTH TWICE DAILY 60 tablet 5   sertraline (ZOLOFT) 100 MG tablet TAKE 1 TABLET BY MOUTH TWICE DAILY 180 tablet 1   sulfamethoxazole-trimethoprim (BACTRIM DS) 800-160 MG tablet Take 1 tablet by mouth 2 (two) times daily. 10 tablet 0   tiZANidine (ZANAFLEX) 4 MG tablet TAKE 1 TABLET BY MOUTH EVERY 6 HOURS AS NEEDED FOR MUSCLE SPASMS 40 tablet  2   traMADol (ULTRAM) 50 MG tablet Take 1 tablet (50 mg total) by mouth every 6 (six) hours as needed. 50 tablet 1   triamcinolone cream (KENALOG) 0.1 % Apply 1 application topically 2 (two) times daily. Use as needed for itchy rash (Patient taking differently: Apply 1  application topically 2 (two) times daily as needed (itchy rash). Use as needed for itchy rash) 30 g 1   triamcinolone cream (KENALOG) 0.5 % Apply 1 application on the skin twice a day (Patient not taking: Reported on 10/05/2021) 15 g 0   Vitamin D, Ergocalciferol, (DRISDOL) 1.25 MG (50000 UNIT) CAPS capsule TAKE 1 CAPSULE BY MOUTH EVERY 7 DAYS (Patient taking differently: Take 50,000 Units by mouth every Friday.) 4 capsule 4   No current facility-administered medications for this visit.    Allergies:   Morphine and Shellfish-derived products    Social History:  The patient  reports that he has never smoked. He has never used smokeless tobacco. He reports that he does not drink alcohol and does not use drugs.   Family History:  The patient's ***family history includes Cancer in his maternal grandmother; Coronary artery disease in his brother and brother; Diabetes in his brother, brother, and [name] [name]; Heart attack in an other family member; Heart failure in his father and [name] [name]; Hypothyroidism in his brother; Irregular heart beat in his daughter; Other in his brother; Pancreatic cancer in his brother; Stroke in his brother.    ROS:  Please see the history of present illness.   Otherwise, review of systems are positive for ***.   All other systems are reviewed and negative.    PHYSICAL EXAM: VS:  There were no vitals taken for this visit. , BMI There is no height or weight on file to calculate BMI. GEN: Well nourished, well developed, in no acute distress HEENT: normal Neck: no JVD, carotid bruits, or masses Cardiac: ***RRR; no murmurs, rubs, or gallops,no edema  Respiratory:  clear to auscultation bilaterally, normal work of breathing GI: soft, nontender, nondistended, + BS MS: no deformity or atrophy Skin: warm and dry, no rash Neuro:  Strength and sensation are intact Psych: euthymic mood, full affect   EKG:   The ekg ordered today demonstrates ***   Recent Labs: 06/29/2021: TSH  3.42 08/30/2021: B Natriuretic Peptide 110.7; Magnesium 1.9 10/05/2021: ALT 18; BUN 24; Creatinine, Ser 1.99; Hemoglobin 10.9; Platelets 474.0; Potassium 4.7; Sodium 135   Lipid Panel    Component Value Date/Time   CHOL 101 06/29/2021 1117   CHOL 99 (L) 09/27/2017 0823   TRIG 118.0 06/29/2021 1117   HDL 31.40 (L) 06/29/2021 1117   HDL 30 (L) 09/27/2017 0823   CHOLHDL 3 06/29/2021 1117   VLDL 23.6 06/29/2021 1117   LDLCALC 46 06/29/2021 1117   LDLCALC 62 08/14/2020 1433   LDLDIRECT 77.0 08/20/2019 0924     Other studies Reviewed: Additional studies/ records that were reviewed today with results demonstrating: ***.   ASSESSMENT AND PLAN:  CAD:  AFib: Hyperlipidemia: Anticoagulated: DM: CKD: DOE/obesity: Some deconditioning.    Current medicines are reviewed at length with the patient today.  The patient concerns regarding his medicines were addressed.  The following changes have been made:  No change***  Labs/ tests ordered today include: *** No orders of the defined types were placed in this encounter.   Recommend 150 minutes/week of aerobic exercise Low fat, low carb, high fiber diet recommended  Disposition:   FU in ***   Signed, [employer]  Irish Lack, MD  10/18/2021 1:10 PM    [facility] [place], [place], [place]  [address] Phone: [phone]; Fax: [phone]

## 2021-10-19 ENCOUNTER — Ambulatory Visit: Payer: Medicare Other | Admitting: Interventional Cardiology

## 2021-10-19 DIAGNOSIS — I5032 Chronic diastolic (congestive) heart failure: Secondary | ICD-10-CM

## 2021-10-19 DIAGNOSIS — N189 Chronic kidney disease, unspecified: Secondary | ICD-10-CM

## 2021-10-19 DIAGNOSIS — I25700 Atherosclerosis of coronary artery bypass graft(s), unspecified, with unstable angina pectoris: Secondary | ICD-10-CM

## 2021-10-19 DIAGNOSIS — I480 Paroxysmal atrial fibrillation: Secondary | ICD-10-CM

## 2021-10-19 DIAGNOSIS — E785 Hyperlipidemia, unspecified: Secondary | ICD-10-CM

## 2021-10-27 ENCOUNTER — Encounter: Payer: Self-pay | Admitting: Family Medicine

## 2021-11-02 ENCOUNTER — Other Ambulatory Visit: Payer: Self-pay

## 2021-11-02 ENCOUNTER — Other Ambulatory Visit (HOSPITAL_BASED_OUTPATIENT_CLINIC_OR_DEPARTMENT_OTHER): Payer: Self-pay

## 2021-11-02 ENCOUNTER — Ambulatory Visit (INDEPENDENT_AMBULATORY_CARE_PROVIDER_SITE_OTHER): Payer: Medicare Other | Admitting: Endocrinology

## 2021-11-02 ENCOUNTER — Telehealth: Payer: Self-pay | Admitting: Pharmacy Technician

## 2021-11-02 VITALS — BP 140/70 | HR 60 | Ht 68.9 in | Wt 188.6 lb

## 2021-11-02 DIAGNOSIS — E1165 Type 2 diabetes mellitus with hyperglycemia: Secondary | ICD-10-CM | POA: Diagnosis not present

## 2021-11-02 LAB — POCT GLYCOSYLATED HEMOGLOBIN (HGB A1C): Hemoglobin A1C: 6.4 % — AB (ref 4.0–5.6)

## 2021-11-02 MED ORDER — FREESTYLE LIBRE 2 READER DEVI
1.0000 | Freq: Once | 1 refills | Status: DC
  Filled 2021-11-02 (×2): qty 1, 90d supply, fill #0

## 2021-11-02 MED ORDER — TOUJEO MAX SOLOSTAR 300 UNIT/ML ~~LOC~~ SOPN: 120.0000 [IU] | PEN_INJECTOR | SUBCUTANEOUS | 3 refills | Status: DC

## 2021-11-02 NOTE — Patient Instructions (Addendum)
check your blood sugar twice a day.  vary the time of day when you check, between before the 3 meals, and at bedtime.  also check if you have symptoms of your blood sugar being too high or too low.  please keep a record of the readings and bring it to your next appointment here (or you can bring the meter itself).  You can write it on any piece of paper.  please call us sooner if your blood sugar goes below 70, or if you have a lot of readings over 200.   Please reduce Toujeo to 120 units each morning.  On this type of insulin schedule, you should eat meals on a regular schedule.  If a meal is missed or significantly delayed, your blood sugar could go low.   I have sent a prescription to your pharmacy, for a new continuous glucose monitor reader.   Please come back for a follow-up appointment in 2 months.

## 2021-11-02 NOTE — Progress Notes (Signed)
Subjective:    Patient ID: [name] [name], male    DOB: [birth year], 75 y.o.   MRN: [id number]  HPI Pt returns for f/u of diabetes mellitus:  DM type: Insulin-requiring type 2.  Dx'ed: 4098 Complications: PN, stage 3b CRI, CAD and PAD.   Therapy: insulin since 2008.   DKA: never Severe hypoglycemia: once (2022) Pancreatitis: never Pancreatic imaging: 2017 CT showed fatty replacement of the pancreatic head and body.  SDOH: he could not afford Trulicity.   Other: he takes QD insulin, after poor results with multiple daily injections; he uses FL continuous glucose monitor.   Interval history: I reviewed continuous glucose monitor download, but there are no data.  Pt says this is due to losing his reader.  Last week, he had an episode severe hypoglycemia.  This happened before lunch.  He had breakfast that day.    Past Medical History:  Diagnosis Date   Acquired atrophy of thyroid 06/05/2013   Overview:  July 2014: controlled on synthroid 70mcg since 2012 May 2015: decreased to Synthroid 50 mcg  Last Assessment & Plan:  Pt doing well with most recent TSH within normal range.  Will continue current dosage of Synthroid 91mcg. Pt reminded to take medication on empty stomach. Will continue to monitor with periodic laboratory assessment in 3 months.    Anemia    hemoglobin 7.4, iron deficiency, January, 2011, 2 unit transfusion, endoscopy normal, capsule endoscopy February, 2011 no small bowel abnormalities.   Most likely source gastric erosions, followed by GI   Anxiety    Bradycardia    CAD (coronary artery disease)    A. CABG in 2000,status post cardiac cath in 2006, 2009 ....continued chest pain and SOB despite oral medication adjestments including Ranexa. B. Cath November 2009/ mRCA - 2.75 x 23 Abbott Xience V drug-eluting stent ...11/26/2008 to distal  RCA leading to acute marginal.  C. Cath 07/2012 for CP - stable anatomy, med rx. d. cath 2015 and 05/30/2015 stable anatomy, consider  Myoview if has CP again   Carotid artery disease ([place]) 08/01/2015   Doppler, May 29, 2015, 1-39% bilateral ICA    Cerebral ischemia    MRI November, 2010, chronic microvascular ischemia   CKD (chronic kidney disease), stage III ([place])    Concussion    Depression    Bipolar   Diabetes mellitus ([place])    Edema    Essential hypertension 06/05/2013   Overview:  July 2014: Controlled with Bystolic Sep 1191: Changed to atenolol.  Last Assessment & Plan:  Will change to atenolol for cost and follow Improved.  Medication compliance strongly encouraged BP: 116/64 mmHg    Overview:  July 2014: Controlled with Bystolic Sep 4782: Changed to atenolol.  Last Assessment & Plan:  Will change to atenolol for cost and follow   Falling episodes    these have occurred in the past and again recurring 2011   Family history of adverse reaction to anesthesia    "mother died during bypass surgery but not sure if it has to do with anesthesia"   Gastric ulcer    GERD (gastroesophageal reflux disease)    H/O medication noncompliance    Due to loss of insurance   H/O multiple concussions    Hard of hearing    Heart murmur    History of blood transfusion 12/20/2013   History of kidney stones    Hx of CABG    2000,  / one median sternotomy suture broken her chest x-ray  November, 2010, no clinical significance   Hyperlipidemia    Hypertension    pt. denies   Iron deficiency anemia    Long term (current) use of anticoagulants [Z79.01] 07/20/2016   Low back pain 06/12/2009   Qualifier: Diagnosis of  By: [name]    Nephrolithiasis    Orthostasis    OSA (obstructive sleep apnea)    PAF (paroxysmal atrial fibrillation) ([place])    a. dx 2017, started on amiodarone/warfarin but patient intermittently noncompliant.   PSVT (paroxysmal supraventricular tachycardia) ([place])    RBBB 07/09/2009   Qualifier: Diagnosis of  By: [name], MD, [facility], [name]    RLS (restless legs syndrome) 09/19/2009   Qualifier:  Diagnosis of  By: [name]   Overview:  July 2014: Controlled with Mirapex  Last Assessment & Plan:  Patient is doing well. Will continue current management and follow clinically.   Seizure disorder ([place]) 01/09/2017   Spondylosis    C5-6, C6-7 MRI 2010   Syncope 03/2016   TBI (traumatic brain injury) 2019   Thyroid disease    Tubulovillous adenoma of colon 2007   Vitamin D deficiency 05/17/2017   Wears glasses     Past Surgical History:  Procedure Laterality Date   ANTERIOR CERVICAL DECOMP/DISCECTOMY FUSION N/A 05/31/2016   Procedure: ANTERIOR CERVICAL DECOMPRESSION/DISCECTOMY FUSION CERVICAL FIVE-SIX,CERVICAL SIX-SEVEN;  Surgeon: [name], MD;  Location: [place] [facility];  Service: Neurosurgery;  Laterality: N/A;   CARDIAC CATHETERIZATION N/A 05/30/2015   Procedure: Left Heart Cath and Coronary Angiography;  Surgeon: [name], MD;  Location: [place] [facility];  Service: Cardiovascular;  Laterality: N/A;   COLONOSCOPY     CORONARY ARTERY BYPASS GRAFT     2000   ESOPHAGOGASTRODUODENOSCOPY     LEFT HEART CATH AND CORS/GRAFTS ANGIOGRAPHY N/A 12/15/2017   Procedure: LEFT HEART CATH AND CORS/GRAFTS ANGIOGRAPHY;  Surgeon: [name], MD;  Location: [place] [facility];  Service: Cardiovascular;  Laterality: N/A;   LEFT HEART CATHETERIZATION WITH CORONARY/GRAFT ANGIOGRAM N/A 08/01/2012   Procedure: LEFT HEART CATHETERIZATION WITH Beatrix Fetters;  Surgeon: [name], MD;  Location: [facility] CATH LAB;  Service: Cardiovascular;  Laterality: N/A;   LEFT HEART CATHETERIZATION WITH CORONARY/GRAFT ANGIOGRAM N/A 01/03/2015   Procedure: LEFT HEART CATHETERIZATION WITH Beatrix Fetters;  Surgeon: [name], MD;  Location: [facility] CATH LAB;  Service: Cardiovascular;  Laterality: N/A;   NASAL SEPTUM SURGERY     UP3   PERCUTANEOUS CORONARY STENT INTERVENTION (PCI-S)  10/2008   mRCA PCI  2.75 x 23 Abbott Xience V drug-eluting stent    RIGHT/LEFT HEART CATH AND  CORONARY/GRAFT ANGIOGRAPHY N/A 01/07/2020   Procedure: RIGHT/LEFT HEART CATH AND CORONARY/GRAFT ANGIOGRAPHY;  Surgeon: [name], MD;  Location: [place] CV LAB;  Service: Cardiovascular;  Laterality: N/A;   TOTAL KNEE ARTHROPLASTY Right 09/22/2021   Procedure: RIGHT TOTAL KNEE ARTHROPLASTY;  Surgeon: [name], MD;  Location: [facility];  Service: Orthopedics;  Laterality: Right;   ULTRASOUND GUIDANCE FOR VASCULAR ACCESS  12/15/2017   Procedure: Ultrasound Guidance For Vascular Access;  Surgeon: [name], MD;  Location: [place] CV LAB;  Service: Cardiovascular;;    Social History   Socioeconomic History   Marital status: Married    Spouse name: Not on file   Number of children: 4   Years of education: 13   Highest education level: Not on file  Occupational History   Occupation: retired  Tobacco Use   Smoking  status: Never   Smokeless tobacco: Never  Vaping Use   Vaping Use: Never used  Substance and Sexual Activity   Alcohol use: No    Alcohol/week: 0.0 standard drinks    Comment: stopped drinking in 1998   Drug use: No   Sexual activity: Not on file  Other Topics Concern   Not on file  Social History Narrative   Patient is right handed.   Patient does not drink caffeine.   Social Determinants of Health   Financial Resource Strain: Not on file  Food Insecurity: Not on file  Transportation Needs: Not on file  Physical Activity: Not on file  Stress: Not on file  Social Connections: Not on file  Intimate Partner Violence: Not on file    Current Outpatient Medications on File Prior to Visit  Medication Sig Dispense Refill   albuterol (VENTOLIN HFA) 108 (90 Base) MCG/ACT inhaler Inhale 2 puffs into the lungs every 6 (six) hours as needed for wheezing or shortness of breath. 18 g 2   amLODipine (NORVASC) 5 MG tablet TAKE 1 TABLET BY MOUTH ONCE DAILY 90 tablet 2   apixaban (ELIQUIS) 5 MG TABS tablet Take 1 tablet (5 mg total) by mouth 2 (two) times  daily. 60 tablet 5   aspirin EC 81 MG tablet Take 81 mg by mouth daily. Swallow whole.     atorvastatin (LIPITOR) 80 MG tablet Take 1 tablet (80 mg total) by mouth daily. (Patient taking differently: Take 80 mg by mouth at bedtime.) 90 tablet 3   busPIRone (BUSPAR) 7.5 MG tablet Take 1 tablet (7.5 mg total) by mouth 2 (two) times daily. 60 tablet 3   cetirizine (ZYRTEC) 10 MG tablet TAKE 1 TABLET (10 MG TOTAL) BY MOUTH DAILY. 100 tablet 2   Continuous Blood Gluc Sensor (FREESTYLE LIBRE 14 DAY SENSOR) MISC USE TO CHECK BLOOD SUGAR AND CHANGE EVERY 14 DAYS 6 each 3   Diclofenac Sodium (PENNSAID) 2 % SOLN Place 1 application onto the skin 2 (two) times daily. (Patient taking differently: Place 1 application onto the skin 2 (two) times daily as needed (pain).) 112 g 2   divalproex (DEPAKOTE) 500 MG DR tablet Take 1 tablet (500 mg total) by mouth 2 (two) times daily. 60 tablet 5   famotidine (PEPCID) 20 MG tablet TAKE 1 TABLET BY MOUTH 2 TIMES DAILY 60 tablet 2   fenofibrate (TRICOR) 145 MG tablet TAKE 1 TABLET (145 MG TOTAL) BY MOUTH DAILY. 90 tablet 1   furosemide (LASIX) 20 MG tablet TAKE 1 TABLET BY MOUTH DAILY AS NEEDED FOR EDEMA 30 tablet 3   glucose blood (ACCU-CHEK AVIVA) test strip Use as directed to check blood sugar 4 times daily.  DX E11.9 (Patient taking differently: Use as directed to check blood sugar 4 times daily.  DX E11.9) 200 each 6   hydrocortisone 2.5 % cream Apply on to the skin 2 (two) times daily. 30 g 1   hydrOXYzine (ATARAX/VISTARIL) 10 MG tablet Take 1 tablet (10 mg total) by mouth 3 (three) times daily as needed for anxiety or itching. 30 tablet 1   Insulin Pen Needle 31G X 8 MM MISC use to inject insulin twice a day 100 each 3   Insulin Syringe-Needle U-100 31G X 5/16" 1 ML MISC USE AS DIRECTED WITH NOVOLIN 100 each 0   isosorbide mononitrate (IMDUR) 120 MG 24 hr tablet TAKE 1 TABLET (120 MG TOTAL) BY MOUTH DAILY. 90 tablet 3   levothyroxine (SYNTHROID) 100 MCG tablet  TAKE 1  TABLET (100 MCG TOTAL) BY MOUTH DAILY. 90 tablet 1   meclizine (ANTIVERT) 25 MG tablet Take 1 tablet (25 mg total) by mouth 3 (three) times daily as needed for dizziness. 30 tablet 0   metoprolol succinate (TOPROL-XL) 50 MG 24 hr tablet TAKE 1 AND 1/2 TABLETS EVERY DAY FOR A TOTAL OF 75 MG 135 tablet 3   nitroGLYCERIN (NITROSTAT) 0.4 MG SL tablet PLACE 1 TABLET UNDER THE TONGUE EVERY 5 MINUTES AS NEEDED FOR CHEST PAIN. 25 tablet 1   ondansetron (ZOFRAN) 4 MG tablet Take 1 tablet (4 mg total) by mouth every 8 (eight) hours as needed for nausea or vomiting. 30 tablet 1   pantoprazole (PROTONIX) 40 MG tablet TAKE 1 TABLET BY MOUTH EVERY DAY 90 tablet 3   pramipexole (MIRAPEX) 1 MG tablet TAKE 1/2 TO 1 TABLET AT BEDTIME (Patient taking differently: Take 0.5-1 mg by mouth at bedtime.) 90 tablet 0   ranolazine (RANEXA) 500 MG 12 hr tablet TAKE 1 TABLET BY MOUTH TWICE DAILY 60 tablet 5   sertraline (ZOLOFT) 100 MG tablet TAKE 1 TABLET BY MOUTH TWICE DAILY 180 tablet 1   sulfamethoxazole-trimethoprim (BACTRIM DS) 800-160 MG tablet Take 1 tablet by mouth 2 (two) times daily. 10 tablet 0   tiZANidine (ZANAFLEX) 4 MG tablet TAKE 1 TABLET BY MOUTH EVERY 6 HOURS AS NEEDED FOR MUSCLE SPASMS 40 tablet 2   traMADol (ULTRAM) 50 MG tablet Take 1 tablet (50 mg total) by mouth every 6 (six) hours as needed. 50 tablet 1   triamcinolone cream (KENALOG) 0.1 % Apply 1 application topically 2 (two) times daily. Use as needed for itchy rash (Patient taking differently: Apply 1 application topically 2 (two) times daily as needed (itchy rash). Use as needed for itchy rash) 30 g 1   triamcinolone cream (KENALOG) 0.5 % Apply 1 application on the skin twice a day 15 g 0   Vitamin D, Ergocalciferol, (DRISDOL) 1.25 MG (50000 UNIT) CAPS capsule TAKE 1 CAPSULE BY MOUTH EVERY 7 DAYS (Patient taking differently: Take 50,000 Units by mouth every Friday.) 4 capsule 4   No current facility-administered medications on file prior to visit.     Allergies  Allergen Reactions   Morphine Other (See Comments)    Hallucinations   Shellfish-Derived Products Itching    Family History  Problem Relation Age of Onset   Pancreatic cancer Brother    Diabetes Brother    Coronary artery disease Brother    Stroke Brother    Diabetes Brother    Diabetes Mother    Heart failure Mother    Heart failure Father    Hypothyroidism Brother    Coronary artery disease Brother    Other Brother        colon surgery   Heart attack Other        Nephew   Irregular heart beat Daughter    Cancer Maternal Grandmother        unknown    Colon cancer Neg Hx    Stomach cancer Neg Hx    Liver cancer Neg Hx    Rectal cancer Neg Hx    Esophageal cancer Neg Hx     BP 140/70   Pulse 60   Ht 5' 8.9" (1.75 m)   Wt 188 lb 9.6 oz (85.5 kg)   SpO2 98%   BMI 27.93 kg/m    Review of Systems     Objective:   Physical Exam    Lab Results  Component Value Date   HGBA1C 6.4 (A) 11/02/2021      Assessment & Plan:  Insulin-requiring type 2 DM: overcontrolled.   Hypoglycemia, severe: Prob due to lingering decreased insulin requirement from hospitalization.  Patient Instructions  check your blood sugar twice a day.  vary the time of day when you check, between before the 3 meals, and at bedtime.  also check if you have symptoms of your blood sugar being too high or too low.  please keep a record of the readings and bring it to your next appointment here (or you can bring the meter itself).  You can write it on any piece of paper.  please call us sooner if your blood sugar goes below 70, or if you have a lot of readings over 200.   Please reduce Toujeo to 120 units each morning.  On this type of insulin schedule, you should eat meals on a regular schedule.  If a meal is missed or significantly delayed, your blood sugar could go low.   I have sent a prescription to your pharmacy, for a new continuous glucose monitor reader.   Please come back  for a follow-up appointment in 2 months.

## 2021-11-02 NOTE — Telephone Encounter (Signed)
Patient Advocate Encounter  Received notification from [place] that prior authorization for FREESTYLE LIBRE 2 READER is required.   PA NOT SUBMITTED Key  BRATUXNP MUST SUBMIT TO ONE OF LISTED VENDORS, NOT PHARMACY:  Johnnette Gourd, MD call [facility] [phone], [place] [phone], [place]   [facility] will continue to follow  Luciano Cutter, CPhT Patient [facility] Phone: [phone] Fax:  [phone]

## 2021-11-03 ENCOUNTER — Ambulatory Visit: Payer: Medicare Other | Attending: Internal Medicine

## 2021-11-03 ENCOUNTER — Other Ambulatory Visit (HOSPITAL_BASED_OUTPATIENT_CLINIC_OR_DEPARTMENT_OTHER): Payer: Self-pay

## 2021-11-03 DIAGNOSIS — Z23 Encounter for immunization: Secondary | ICD-10-CM

## 2021-11-03 MED ORDER — INFLUENZA VAC A&B SA ADJ QUAD 0.5 ML IM PRSY
PREFILLED_SYRINGE | INTRAMUSCULAR | 0 refills | Status: DC
  Filled 2021-11-03: qty 0.5, 1d supply, fill #0

## 2021-11-03 NOTE — Progress Notes (Signed)
   Covid-19 Vaccination Clinic  Name:  [name] [name]    MRN: [id number] DOB: February 06, 1946  11/03/2021  [name] was observed post Covid-19 immunization for 15 minutes without incident. He was provided with Vaccine Information Sheet and instruction to access the V-Safe system.   [name] was instructed to call 911 with any severe reactions post vaccine: Difficulty breathing  Swelling of face and throat  A fast heartbeat  A bad rash all over body  Dizziness and weakness   Immunizations Administered     Name Date Dose VIS Date Route   Pfizer Covid-19 Vaccine Bivalent Booster 11/03/2021  2:43 PM 0.3 mL 08/05/2021 Intramuscular   Manufacturer: [place]   Lot: [id number]   [place]: [phone]

## 2021-11-06 NOTE — Telephone Encounter (Signed)
Message sent thru MyChart to contact one of the vendors to get the process started for help with covering the [facility].

## 2021-11-07 ENCOUNTER — Other Ambulatory Visit (HOSPITAL_BASED_OUTPATIENT_CLINIC_OR_DEPARTMENT_OTHER): Payer: Self-pay

## 2021-11-07 MED ORDER — PFIZER COVID-19 VAC BIVALENT 30 MCG/0.3ML IM SUSP
INTRAMUSCULAR | 0 refills | Status: DC
  Filled 2021-11-07: qty 0.3, 1d supply, fill #0

## 2021-11-09 ENCOUNTER — Other Ambulatory Visit (HOSPITAL_BASED_OUTPATIENT_CLINIC_OR_DEPARTMENT_OTHER): Payer: Self-pay

## 2021-11-12 ENCOUNTER — Other Ambulatory Visit (HOSPITAL_BASED_OUTPATIENT_CLINIC_OR_DEPARTMENT_OTHER): Payer: Self-pay

## 2021-11-12 ENCOUNTER — Other Ambulatory Visit: Payer: Self-pay | Admitting: Endocrinology

## 2021-11-12 MED ORDER — FREESTYLE LIBRE 2 READER DEVI: 1.0000 | Freq: Once | 1 refills | Status: AC

## 2021-11-12 MED ORDER — FREESTYLE LIBRE 14 DAY SENSOR MISC: 3 refills | Status: DC

## 2021-11-12 MED FILL — Continuous Glucose System Sensor: 28 days supply | Qty: 2 | Fill #5 | Status: CN

## 2021-11-13 ENCOUNTER — Other Ambulatory Visit: Payer: Self-pay | Admitting: Endocrinology

## 2021-11-13 ENCOUNTER — Other Ambulatory Visit (HOSPITAL_BASED_OUTPATIENT_CLINIC_OR_DEPARTMENT_OTHER): Payer: Self-pay

## 2021-11-13 DIAGNOSIS — E1165 Type 2 diabetes mellitus with hyperglycemia: Secondary | ICD-10-CM

## 2021-11-13 MED ORDER — FREESTYLE LIBRE 2 SENSOR MISC: 3 refills | Status: DC

## 2021-11-16 ENCOUNTER — Other Ambulatory Visit: Payer: Self-pay | Admitting: Family Medicine

## 2021-11-16 ENCOUNTER — Other Ambulatory Visit: Payer: Self-pay | Admitting: Endocrinology

## 2021-11-16 ENCOUNTER — Telehealth: Payer: Self-pay | Admitting: Endocrinology

## 2021-11-16 ENCOUNTER — Other Ambulatory Visit (HOSPITAL_BASED_OUTPATIENT_CLINIC_OR_DEPARTMENT_OTHER): Payer: Self-pay

## 2021-11-16 ENCOUNTER — Other Ambulatory Visit: Payer: Self-pay

## 2021-11-16 DIAGNOSIS — E1165 Type 2 diabetes mellitus with hyperglycemia: Secondary | ICD-10-CM

## 2021-11-16 DIAGNOSIS — M25561 Pain in right knee: Secondary | ICD-10-CM | POA: Diagnosis not present

## 2021-11-16 MED ORDER — FREESTYLE LIBRE 2 READER DEVI
1.0000 | Freq: Once | 1 refills | Status: AC
  Filled 2021-11-16 – 2022-05-20 (×2): qty 1, 90d supply, fill #0

## 2021-11-16 MED ORDER — HYDROXYZINE HCL 10 MG PO TABS
10.0000 mg | ORAL_TABLET | Freq: Three times a day (TID) | ORAL | 1 refills | Status: DC | PRN
  Filled 2021-11-16: qty 30, 10d supply, fill #0
  Filled 2021-12-07: qty 30, 10d supply, fill #1

## 2021-11-16 MED ORDER — FREESTYLE LIBRE 2 SENSOR MISC
3 refills | Status: DC
  Filled 2021-11-16: qty 2, 28d supply, fill #0

## 2021-11-16 MED FILL — Ranolazine Tab ER 12HR 500 MG: ORAL | 30 days supply | Qty: 60 | Fill #5 | Status: AC

## 2021-11-16 NOTE — Telephone Encounter (Signed)
Pt called needing the following: Continuous Blood Gluc Sensor (FREESTYLE LIBRE 14 DAY SENSOR) MISC Continuous Blood Gluc Sensor (FREESTYLE LIBRE 2 SENSOR) MISC insulin glargine, 2 Unit Dial, (TOUJEO MAX SOLOSTAR) 300 UNIT/ML Solostar Pen  Please sent to  [facility] Phone:  [phone]  Fax:  [phone]

## 2021-11-17 ENCOUNTER — Other Ambulatory Visit: Payer: Self-pay | Admitting: Endocrinology

## 2021-11-17 ENCOUNTER — Other Ambulatory Visit: Payer: Self-pay

## 2021-11-17 ENCOUNTER — Other Ambulatory Visit (HOSPITAL_BASED_OUTPATIENT_CLINIC_OR_DEPARTMENT_OTHER): Payer: Self-pay

## 2021-11-17 DIAGNOSIS — E1165 Type 2 diabetes mellitus with hyperglycemia: Secondary | ICD-10-CM

## 2021-11-17 MED ORDER — TOUJEO MAX SOLOSTAR 300 UNIT/ML ~~LOC~~ SOPN: 120.0000 [IU] | PEN_INJECTOR | SUBCUTANEOUS | 3 refills | Status: DC

## 2021-11-17 MED ORDER — TOUJEO MAX SOLOSTAR 300 UNIT/ML ~~LOC~~ SOPN
120.0000 [IU] | PEN_INJECTOR | SUBCUTANEOUS | 3 refills | Status: DC
  Filled 2021-11-17: qty 12, 30d supply, fill #0
  Filled 2021-12-22: qty 12, 30d supply, fill #1

## 2021-11-17 MED ORDER — FREESTYLE LIBRE 2 SENSOR MISC
3 refills | Status: DC
  Filled 2021-11-17: qty 2, 28d supply, fill #0

## 2021-11-17 NOTE — Telephone Encounter (Signed)
Rx sent in to requested pharmacy.  

## 2021-11-18 ENCOUNTER — Other Ambulatory Visit (HOSPITAL_BASED_OUTPATIENT_CLINIC_OR_DEPARTMENT_OTHER): Payer: Self-pay

## 2021-11-24 ENCOUNTER — Other Ambulatory Visit (HOSPITAL_BASED_OUTPATIENT_CLINIC_OR_DEPARTMENT_OTHER): Payer: Self-pay

## 2021-11-24 ENCOUNTER — Ambulatory Visit: Payer: Medicare Other | Admitting: Family Medicine

## 2021-11-25 ENCOUNTER — Telehealth: Payer: Self-pay | Admitting: Endocrinology

## 2021-11-25 NOTE — Telephone Encounter (Signed)
Patient called to find out if he he runs out of his Toujeo and is not able to refill it immediately (due to cost) can he take the [place] in place of the [place]. Patient advised he has Fiasp at home in his fridge that he used to take.  Patients call back number is [phone]

## 2021-11-26 DIAGNOSIS — R531 Weakness: Secondary | ICD-10-CM | POA: Diagnosis not present

## 2021-11-26 DIAGNOSIS — R262 Difficulty in walking, not elsewhere classified: Secondary | ICD-10-CM | POA: Diagnosis not present

## 2021-11-26 DIAGNOSIS — Z96651 Presence of right artificial knee joint: Secondary | ICD-10-CM | POA: Diagnosis not present

## 2021-11-26 DIAGNOSIS — Z4789 Encounter for other orthopedic aftercare: Secondary | ICD-10-CM | POA: Diagnosis not present

## 2021-11-26 NOTE — Telephone Encounter (Signed)
Patient has been informed, requested my chart message as well.

## 2021-12-01 ENCOUNTER — Telehealth: Payer: Self-pay | Admitting: Endocrinology

## 2021-12-01 NOTE — Telephone Encounter (Addendum)
LVM for pt to call back.  What have his readings been the last 3 days? What medications are you taking daily? Any recent steroids injections or medications? Mychart message sent to pt as well.

## 2021-12-01 NOTE — Telephone Encounter (Signed)
PT called experiencing low BS in the morning. Checks BS before going to bed and is usually around 170. PT continue to experience low BS each morning now. Please advise [phone]

## 2021-12-01 NOTE — Telephone Encounter (Signed)
Fasting morning bs  12/27: 43 12/26: 49 12/25: 50 Medication Toujeo 120 units every morning  No recent steroid injections.

## 2021-12-02 NOTE — Telephone Encounter (Signed)
Called and left a detailed message for pt to reduce his Toujeo to 90 units daily. Call us back if his blood sugars increase too much. Mychart message sent as well.

## 2021-12-07 ENCOUNTER — Other Ambulatory Visit (HOSPITAL_BASED_OUTPATIENT_CLINIC_OR_DEPARTMENT_OTHER): Payer: Self-pay

## 2021-12-07 ENCOUNTER — Other Ambulatory Visit: Payer: Self-pay | Admitting: Family Medicine

## 2021-12-07 NOTE — Telephone Encounter (Signed)
Last vit d checked 02/23/21 and was 46.43.  Do you want him to continue?

## 2021-12-08 ENCOUNTER — Other Ambulatory Visit (HOSPITAL_COMMUNITY): Payer: Self-pay

## 2021-12-08 ENCOUNTER — Ambulatory Visit: Payer: Medicare Other | Admitting: Neurology

## 2021-12-08 ENCOUNTER — Other Ambulatory Visit (HOSPITAL_BASED_OUTPATIENT_CLINIC_OR_DEPARTMENT_OTHER): Payer: Self-pay

## 2021-12-08 ENCOUNTER — Telehealth: Payer: Self-pay

## 2021-12-08 DIAGNOSIS — R262 Difficulty in walking, not elsewhere classified: Secondary | ICD-10-CM | POA: Diagnosis not present

## 2021-12-08 DIAGNOSIS — Z96651 Presence of right artificial knee joint: Secondary | ICD-10-CM | POA: Diagnosis not present

## 2021-12-08 DIAGNOSIS — Z4789 Encounter for other orthopedic aftercare: Secondary | ICD-10-CM | POA: Diagnosis not present

## 2021-12-08 DIAGNOSIS — R531 Weakness: Secondary | ICD-10-CM | POA: Diagnosis not present

## 2021-12-08 MED ORDER — VITAMIN D (ERGOCALCIFEROL) 1.25 MG (50000 UNIT) PO CAPS
ORAL_CAPSULE | ORAL | 4 refills | Status: AC
  Filled 2021-12-08: qty 4, 28d supply, fill #0
  Filled 2022-01-06: qty 4, 28d supply, fill #1

## 2021-12-08 NOTE — Telephone Encounter (Signed)
Patient Advocate Encounter   Received notification from [facility] that prior authorization for Claiborne [place] is required by his/her insurance [employer]   PA submitted on 12/08/21  Key#: [id number]  Status is pending    [facility] will continue to follow:  Patient Advocate Fax:  [phone]

## 2021-12-09 ENCOUNTER — Other Ambulatory Visit (HOSPITAL_BASED_OUTPATIENT_CLINIC_OR_DEPARTMENT_OTHER): Payer: Self-pay

## 2021-12-09 ENCOUNTER — Ambulatory Visit: Payer: Medicare Other | Admitting: Adult Health

## 2021-12-10 ENCOUNTER — Encounter: Payer: Self-pay | Admitting: Neurology

## 2021-12-10 ENCOUNTER — Other Ambulatory Visit: Payer: Self-pay

## 2021-12-10 DIAGNOSIS — R262 Difficulty in walking, not elsewhere classified: Secondary | ICD-10-CM | POA: Diagnosis not present

## 2021-12-10 DIAGNOSIS — Z96651 Presence of right artificial knee joint: Secondary | ICD-10-CM | POA: Diagnosis not present

## 2021-12-10 DIAGNOSIS — R531 Weakness: Secondary | ICD-10-CM | POA: Diagnosis not present

## 2021-12-10 DIAGNOSIS — E1165 Type 2 diabetes mellitus with hyperglycemia: Secondary | ICD-10-CM

## 2021-12-10 DIAGNOSIS — Z4789 Encounter for other orthopedic aftercare: Secondary | ICD-10-CM | POA: Diagnosis not present

## 2021-12-10 MED ORDER — FREESTYLE LIBRE 2 SENSOR MISC: 3 refills | Status: DC

## 2021-12-17 ENCOUNTER — Other Ambulatory Visit (HOSPITAL_BASED_OUTPATIENT_CLINIC_OR_DEPARTMENT_OTHER): Payer: Self-pay

## 2021-12-17 ENCOUNTER — Other Ambulatory Visit: Payer: Self-pay | Admitting: Endocrinology

## 2021-12-17 ENCOUNTER — Other Ambulatory Visit (HOSPITAL_COMMUNITY): Payer: Self-pay

## 2021-12-17 MED ORDER — FREESTYLE LIBRE 2 SENSOR MISC
1.0000 | 3 refills | Status: DC
  Filled 2021-12-17: qty 2, 28d supply, fill #0
  Filled 2022-02-04: qty 2, 28d supply, fill #1
  Filled 2022-02-24: qty 2, 28d supply, fill #2
  Filled 2022-04-06: qty 2, 28d supply, fill #3

## 2021-12-17 NOTE — Telephone Encounter (Signed)
Patient Advocate Encounter  Received notification from CoverMyMeds that the request for prior authorization for [name] [name] has been denied due it not being on your drug list formulary.  Medication authorization requires one of the following: (1) Your provider submits chart documentation/medical records reflecting that you have failed or have contraindication or intolerance to Humalog cartridge, Humalog Junior KwikPen, Humalog KwikPen, Insulin Lispro KwikPen, or Lyumjev KwikPen; (2) OR your provider has given detailed clinical rationale as to why the above covered drug(s) would not be as effective for you or would cause adverse effects..     Test Claim for Humalog and Lyumjev both process.

## 2021-12-17 NOTE — Telephone Encounter (Signed)
Message sent thru MyChart for pt to let us know about message sent regarding Fiasp from [place].

## 2021-12-22 ENCOUNTER — Ambulatory Visit: Payer: Medicare PPO | Admitting: Dermatology

## 2021-12-22 ENCOUNTER — Other Ambulatory Visit (HOSPITAL_BASED_OUTPATIENT_CLINIC_OR_DEPARTMENT_OTHER): Payer: Self-pay

## 2021-12-24 ENCOUNTER — Other Ambulatory Visit (HOSPITAL_BASED_OUTPATIENT_CLINIC_OR_DEPARTMENT_OTHER): Payer: Self-pay

## 2022-01-04 ENCOUNTER — Ambulatory Visit: Payer: Medicare Other | Admitting: Endocrinology

## 2022-01-06 ENCOUNTER — Other Ambulatory Visit: Payer: Self-pay | Admitting: Family Medicine

## 2022-01-06 ENCOUNTER — Encounter: Payer: Self-pay | Admitting: Family Medicine

## 2022-01-07 ENCOUNTER — Other Ambulatory Visit (HOSPITAL_BASED_OUTPATIENT_CLINIC_OR_DEPARTMENT_OTHER): Payer: Self-pay

## 2022-01-07 ENCOUNTER — Other Ambulatory Visit: Payer: Self-pay

## 2022-01-07 DIAGNOSIS — G2581 Restless legs syndrome: Secondary | ICD-10-CM

## 2022-01-07 MED ORDER — RANOLAZINE ER 500 MG PO TB12
ORAL_TABLET | Freq: Two times a day (BID) | ORAL | 5 refills | Status: DC
  Filled 2022-01-07: qty 60, 30d supply, fill #0

## 2022-01-07 MED ORDER — PRAMIPEXOLE DIHYDROCHLORIDE 1 MG PO TABS
1.0000 mg | ORAL_TABLET | Freq: Every day | ORAL | 0 refills | Status: DC
  Filled 2022-01-07: qty 90, 90d supply, fill #0

## 2022-01-07 MED ORDER — HYDROXYZINE HCL 10 MG PO TABS
10.0000 mg | ORAL_TABLET | Freq: Three times a day (TID) | ORAL | 1 refills | Status: DC | PRN
  Filled 2022-01-07: qty 30, 10d supply, fill #0
  Filled 2022-02-24: qty 30, 10d supply, fill #1

## 2022-01-07 MED ORDER — FUROSEMIDE 20 MG PO TABS
ORAL_TABLET | ORAL | 3 refills | Status: DC
  Filled 2022-01-07: qty 30, 30d supply, fill #0

## 2022-01-08 ENCOUNTER — Other Ambulatory Visit (HOSPITAL_BASED_OUTPATIENT_CLINIC_OR_DEPARTMENT_OTHER): Payer: Self-pay

## 2022-01-14 ENCOUNTER — Ambulatory Visit: Payer: Medicare Other | Admitting: Endocrinology

## 2022-01-19 ENCOUNTER — Other Ambulatory Visit (HOSPITAL_BASED_OUTPATIENT_CLINIC_OR_DEPARTMENT_OTHER): Payer: Self-pay

## 2022-01-19 ENCOUNTER — Ambulatory Visit (INDEPENDENT_AMBULATORY_CARE_PROVIDER_SITE_OTHER): Payer: Medicare Other | Admitting: Endocrinology

## 2022-01-19 ENCOUNTER — Other Ambulatory Visit: Payer: Self-pay

## 2022-01-19 VITALS — BP 150/64 | HR 77 | Ht 68.9 in | Wt 194.0 lb

## 2022-01-19 DIAGNOSIS — E1165 Type 2 diabetes mellitus with hyperglycemia: Secondary | ICD-10-CM | POA: Diagnosis not present

## 2022-01-19 LAB — POCT GLYCOSYLATED HEMOGLOBIN (HGB A1C): Hemoglobin A1C: 7.6 % — AB (ref 4.0–5.6)

## 2022-01-19 MED ORDER — TOUJEO MAX SOLOSTAR 300 UNIT/ML ~~LOC~~ SOPN
84.0000 [IU] | PEN_INJECTOR | SUBCUTANEOUS | 3 refills | Status: DC
  Filled 2022-01-19 – 2022-01-29 (×2): qty 30, 107d supply, fill #0

## 2022-01-19 NOTE — Patient Instructions (Addendum)
check your blood sugar twice a day.  vary the time of day when you check, between before the 3 meals, and at bedtime.  also check if you have symptoms of your blood sugar being too high or too low.  please keep a record of the readings and bring it to your next appointment here (or you can bring the meter itself).  You can write it on any piece of paper.  please call us sooner if your blood sugar goes below 70, or if you have a lot of readings over 200.   Please reduce Toujeo to 84 units each morning.  On this type of insulin schedule, you should eat meals on a regular schedule.  If a meal is missed or significantly delayed, your blood sugar could go low.   Please come back for a follow-up appointment in 2 months.

## 2022-01-19 NOTE — Progress Notes (Signed)
Subjective:    Patient ID: [name] [name], male    DOB: [birth year], 76 y.o.   MRN: [id number]  HPI Pt returns for f/u of diabetes mellitus:  DM type: Insulin-requiring type 2.  Dx'ed: 3009 Complications: PN, stage 3b CRI, CAD and PAD.   Therapy: insulin since 2008.   DKA: never Severe hypoglycemia: once (2022) Pancreatitis: never Pancreatic imaging: 2017 CT showed fatty replacement of the pancreatic head and body.  SDOH: he could not afford Trulicity.   Other: he takes QD insulin, after poor results with multiple daily injections; he uses FL continuous glucose monitor.    Interval history: pt reports mild hypoglycemia approx once per day.  This happens in the early hrs of the AM.  He takes Toujeo, 90 units each morning.  I reviewed continuous glucose monitor data.  Glucose varies from 55-400.  It is in general highest at 11PM-12MN, and lowest at 12N.  It decreases overnight, and increases throughout the afternoon and evening. Past Medical History:  Diagnosis Date   Acquired atrophy of thyroid 06/05/2013   Overview:  July 2014: controlled on synthroid 52mcg since 2012 May 2015: decreased to Synthroid 50 mcg  Last Assessment & Plan:  Pt doing well with most recent TSH within normal range.  Will continue current dosage of Synthroid 52mcg. Pt reminded to take medication on empty stomach. Will continue to monitor with periodic laboratory assessment in 3 months.    Anemia    hemoglobin 7.4, iron deficiency, January, 2011, 2 unit transfusion, endoscopy normal, capsule endoscopy February, 2011 no small bowel abnormalities.   Most likely source gastric erosions, followed by GI   Anxiety    Bradycardia    CAD (coronary artery disease)    A. CABG in 2000,status post cardiac cath in 2006, 2009 ....continued chest pain and SOB despite oral medication adjestments including Ranexa. B. Cath November 2009/ mRCA - 2.75 x 23 Abbott Xience V drug-eluting stent ...11/26/2008 to distal  RCA leading to acute  marginal.  C. Cath 07/2012 for CP - stable anatomy, med rx. d. cath 2015 and 05/30/2015 stable anatomy, consider Myoview if has CP again   Carotid artery disease ([place]) 08/01/2015   Doppler, May 29, 2015, 1-39% bilateral ICA    Cerebral ischemia    MRI November, 2010, chronic microvascular ischemia   CKD (chronic kidney disease), stage III ([place])    Concussion    Depression    Bipolar   Diabetes mellitus ([place])    Edema    Essential hypertension 06/05/2013   Overview:  July 2014: Controlled with Bystolic Sep 2330: Changed to atenolol.  Last Assessment & Plan:  Will change to atenolol for cost and follow Improved.  Medication compliance strongly encouraged BP: 116/64 mmHg    Overview:  July 2014: Controlled with Bystolic Sep 0762: Changed to atenolol.  Last Assessment & Plan:  Will change to atenolol for cost and follow   Falling episodes    these have occurred in the past and again recurring 2011   Family history of adverse reaction to anesthesia    "mother died during bypass surgery but not sure if it has to do with anesthesia"   Gastric ulcer    GERD (gastroesophageal reflux disease)    H/O medication noncompliance    Due to loss of insurance   H/O multiple concussions    Hard of hearing    Heart murmur    History of blood transfusion 12/20/2013   History of kidney stones  Hx of CABG    2000,  / one median sternotomy suture broken her chest x-ray November, 2010, no clinical significance   Hyperlipidemia    Hypertension    pt. denies   Iron deficiency anemia    Long term (current) use of anticoagulants [Z79.01] 07/20/2016   Low back pain 06/12/2009   Qualifier: Diagnosis of  By: [name]    Nephrolithiasis    Orthostasis    OSA (obstructive sleep apnea)    PAF (paroxysmal atrial fibrillation) ([place])    a. dx 2017, started on amiodarone/warfarin but patient intermittently noncompliant.   PSVT (paroxysmal supraventricular tachycardia) ([place])    RBBB 07/09/2009    Qualifier: Diagnosis of  By: [name], MD, [facility], [name]    RLS (restless legs syndrome) 09/19/2009   Qualifier: Diagnosis of  By: [name]   Overview:  July 2014: Controlled with Mirapex  Last Assessment & Plan:  Patient is doing well. Will continue current management and follow clinically.   Seizure disorder ([place]) 01/09/2017   Spondylosis    C5-6, C6-7 MRI 2010   Syncope 03/2016   TBI (traumatic brain injury) 2019   Thyroid disease    Tubulovillous adenoma of colon 2007   Vitamin D deficiency 05/17/2017   Wears glasses     Past Surgical History:  Procedure Laterality Date   ANTERIOR CERVICAL DECOMP/DISCECTOMY FUSION N/A 05/31/2016   Procedure: ANTERIOR CERVICAL DECOMPRESSION/DISCECTOMY FUSION CERVICAL FIVE-SIX,CERVICAL SIX-SEVEN;  Surgeon: [name], MD;  Location: [facility];  Service: Neurosurgery;  Laterality: N/A;   CARDIAC CATHETERIZATION N/A 05/30/2015   Procedure: Left Heart Cath and Coronary Angiography;  Surgeon: [name], MD;  Location: [facility];  Service: Cardiovascular;  Laterality: N/A;   COLONOSCOPY     CORONARY ARTERY BYPASS GRAFT     2000   ESOPHAGOGASTRODUODENOSCOPY     LEFT HEART CATH AND CORS/GRAFTS ANGIOGRAPHY N/A 12/15/2017   Procedure: LEFT HEART CATH AND CORS/GRAFTS ANGIOGRAPHY;  Surgeon: [name], MD;  Location: [facility];  Service: Cardiovascular;  Laterality: N/A;   LEFT HEART CATHETERIZATION WITH CORONARY/GRAFT ANGIOGRAM N/A 08/01/2012   Procedure: LEFT HEART CATHETERIZATION WITH Beatrix Fetters;  Surgeon: [name], MD;  Location: [facility] CATH LAB;  Service: Cardiovascular;  Laterality: N/A;   LEFT HEART CATHETERIZATION WITH CORONARY/GRAFT ANGIOGRAM N/A 01/03/2015   Procedure: LEFT HEART CATHETERIZATION WITH Beatrix Fetters;  Surgeon: [name], MD;  Location: [facility] CATH LAB;  Service: Cardiovascular;  Laterality: N/A;   NASAL SEPTUM SURGERY     UP3   PERCUTANEOUS CORONARY STENT  INTERVENTION (PCI-S)  10/2008   mRCA PCI  2.75 x 23 Abbott Xience V drug-eluting stent    RIGHT/LEFT HEART CATH AND CORONARY/GRAFT ANGIOGRAPHY N/A 01/07/2020   Procedure: RIGHT/LEFT HEART CATH AND CORONARY/GRAFT ANGIOGRAPHY;  Surgeon: [name], MD;  Location: [place] [facility];  Service: Cardiovascular;  Laterality: N/A;   TOTAL KNEE ARTHROPLASTY Right 09/22/2021   Procedure: RIGHT TOTAL KNEE ARTHROPLASTY;  Surgeon: [name], MD;  Location: [facility];  Service: Orthopedics;  Laterality: Right;   ULTRASOUND GUIDANCE FOR VASCULAR ACCESS  12/15/2017   Procedure: Ultrasound Guidance For Vascular Access;  Surgeon: [name], MD;  Location: [place] [facility];  Service: Cardiovascular;;    Social History   Socioeconomic History   Marital status: Married    Spouse name: Not on file   Number of children: 4   Years of education: 13   Highest education  level: Not on file  Occupational History   Occupation: retired  Tobacco Use   Smoking status: Never   Smokeless tobacco: Never  Vaping Use   Vaping Use: Never used  Substance and Sexual Activity   Alcohol use: No    Alcohol/week: 0.0 standard drinks    Comment: stopped drinking in 1998   Drug use: No   Sexual activity: Not on file  Other Topics Concern   Not on file  Social History Narrative   Patient is right handed.   Patient does not drink caffeine.   Social Determinants of Health   Financial Resource Strain: Not on file  Food Insecurity: Not on file  Transportation Needs: Not on file  Physical Activity: Not on file  Stress: Not on file  Social Connections: Not on file  Intimate Partner Violence: Not on file    Current Outpatient Medications on File Prior to Visit  Medication Sig Dispense Refill   albuterol (VENTOLIN HFA) 108 (90 Base) MCG/ACT inhaler Inhale 2 puffs into the lungs every 6 (six) hours as needed for wheezing or shortness of breath. 18 g 2   amLODipine (NORVASC) 5 MG tablet TAKE 1 TABLET BY  MOUTH ONCE DAILY 90 tablet 2   apixaban (ELIQUIS) 5 MG TABS tablet Take 1 tablet (5 mg total) by mouth 2 (two) times daily. 60 tablet 5   aspirin EC 81 MG tablet Take 81 mg by mouth daily. Swallow whole.     atorvastatin (LIPITOR) 80 MG tablet Take 1 tablet (80 mg total) by mouth daily. (Patient taking differently: Take 80 mg by mouth at bedtime.) 90 tablet 3   busPIRone (BUSPAR) 7.5 MG tablet Take 1 tablet (7.5 mg total) by mouth 2 (two) times daily. 60 tablet 3   cetirizine (ZYRTEC) 10 MG tablet TAKE 1 TABLET (10 MG TOTAL) BY MOUTH DAILY. 100 tablet 2   Continuous Blood Gluc Sensor (FREESTYLE LIBRE 2 SENSOR) MISC Use as instructed to check blood sugar, change every 14 days 6 each 3   Continuous Blood Gluc Sensor (FREESTYLE LIBRE 2 SENSOR) MISC Use as directed every 14 days 6 each 3   COVID-19 mRNA bivalent vaccine, Pfizer, (PFIZER COVID-19 VAC BIVALENT) injection Inject into the muscle. 0.3 mL 0   Diclofenac Sodium (PENNSAID) 2 % SOLN Place 1 application onto the skin 2 (two) times daily. (Patient taking differently: Place 1 application onto the skin 2 (two) times daily as needed (pain).) 112 g 2   divalproex (DEPAKOTE) 500 MG DR tablet Take 1 tablet (500 mg total) by mouth 2 (two) times daily. 60 tablet 5   famotidine (PEPCID) 20 MG tablet TAKE 1 TABLET BY MOUTH 2 TIMES DAILY 60 tablet 2   fenofibrate (TRICOR) 145 MG tablet TAKE 1 TABLET (145 MG TOTAL) BY MOUTH DAILY. 90 tablet 1   furosemide (LASIX) 20 MG tablet TAKE 1 TABLET BY MOUTH DAILY AS NEEDED FOR EDEMA 30 tablet 3   glucose blood (ACCU-CHEK AVIVA) test strip Use as directed to check blood sugar 4 times daily.  DX E11.9 (Patient taking differently: Use as directed to check blood sugar 4 times daily.  DX E11.9) 200 each 6   hydrocortisone 2.5 % cream Apply on to the skin 2 (two) times daily. 30 g 1   hydrOXYzine (ATARAX) 10 MG tablet Take 1 tablet (10 mg total) by mouth 3 (three) times daily as needed for anxiety or itching. 30 tablet 1    influenza vaccine adjuvanted (FLUAD) 0.5 ML injection Inject into  the muscle. 0.5 mL 0   levothyroxine (SYNTHROID) 100 MCG tablet TAKE 1 TABLET (100 MCG TOTAL) BY MOUTH DAILY. 90 tablet 1   meclizine (ANTIVERT) 25 MG tablet Take 1 tablet (25 mg total) by mouth 3 (three) times daily as needed for dizziness. 30 tablet 0   metoprolol succinate (TOPROL-XL) 50 MG 24 hr tablet TAKE 1 AND 1/2 TABLETS EVERY DAY FOR A TOTAL OF 75 MG 135 tablet 3   nitroGLYCERIN (NITROSTAT) 0.4 MG SL tablet PLACE 1 TABLET UNDER THE TONGUE EVERY 5 MINUTES AS NEEDED FOR CHEST PAIN. 25 tablet 1   ondansetron (ZOFRAN) 4 MG tablet Take 1 tablet (4 mg total) by mouth every 8 (eight) hours as needed for nausea or vomiting. 30 tablet 1   pantoprazole (PROTONIX) 40 MG tablet TAKE 1 TABLET BY MOUTH EVERY DAY 90 tablet 3   pramipexole (MIRAPEX) 1 MG tablet Take 1/2 to 1 tablet by mouth at bedtime 90 tablet 0   ranolazine (RANEXA) 500 MG 12 hr tablet TAKE 1 TABLET BY MOUTH TWICE DAILY 60 tablet 5   sertraline (ZOLOFT) 100 MG tablet TAKE 1 TABLET BY MOUTH TWICE DAILY 180 tablet 1   sulfamethoxazole-trimethoprim (BACTRIM DS) 800-160 MG tablet Take 1 tablet by mouth 2 (two) times daily. 10 tablet 0   tiZANidine (ZANAFLEX) 4 MG tablet TAKE 1 TABLET BY MOUTH EVERY 6 HOURS AS NEEDED FOR MUSCLE SPASMS 40 tablet 2   traMADol (ULTRAM) 50 MG tablet Take 1 tablet (50 mg total) by mouth every 6 (six) hours as needed. 50 tablet 1   triamcinolone cream (KENALOG) 0.1 % Apply 1 application topically 2 (two) times daily. Use as needed for itchy rash (Patient taking differently: Apply 1 application topically 2 (two) times daily as needed (itchy rash). Use as needed for itchy rash) 30 g 1   triamcinolone cream (KENALOG) 0.5 % Apply 1 application on the skin twice a day 15 g 0   Vitamin D, Ergocalciferol, (DRISDOL) 1.25 MG (50000 UNIT) CAPS capsule TAKE 1 CAPSULE BY MOUTH EVERY 7 DAYS 4 capsule 4   isosorbide mononitrate (IMDUR) 120 MG 24 hr tablet TAKE 1  TABLET (120 MG TOTAL) BY MOUTH DAILY. 90 tablet 3   No current facility-administered medications on file prior to visit.    Allergies  Allergen Reactions   Morphine Other (See Comments)    Hallucinations   Shellfish-Derived Products Itching    Family History  Problem Relation Age of Onset   Pancreatic cancer Brother    Diabetes Brother    Coronary artery disease Brother    Stroke Brother    Diabetes Brother    Diabetes Mother    Heart failure Mother    Heart failure Father    Hypothyroidism Brother    Coronary artery disease Brother    Other Brother        colon surgery   Heart attack Other        Nephew   Irregular heart beat Daughter    Cancer Maternal Grandmother        unknown    Colon cancer Neg Hx    Stomach cancer Neg Hx    Liver cancer Neg Hx    Rectal cancer Neg Hx    Esophageal cancer Neg Hx     BP (!) 150/64    Pulse 77    Ht 5' 8.9" (1.75 m)    Wt 194 lb (88 kg)    SpO2 97%    BMI 28.73 kg/m  Review of Systems     Objective:   Physical Exam    Lab Results  Component Value Date   HGBA1C 7.6 (A) 01/19/2022      Assessment & Plan:  Insulin-requiring type 2 DM. Hypoglycemia, due to insulin: this limits aggressiveness of glycemic control   Patient Instructions  check your blood sugar twice a day.  vary the time of day when you check, between before the 3 meals, and at bedtime.  also check if you have symptoms of your blood sugar being too high or too low.  please keep a record of the readings and bring it to your next appointment here (or you can bring the meter itself).  You can write it on any piece of paper.  please call us sooner if your blood sugar goes below 70, or if you have a lot of readings over 200.   Please reduce Toujeo to 84 units each morning.  On this type of insulin schedule, you should eat meals on a regular schedule.  If a meal is missed or significantly delayed, your blood sugar could go low.   Please come back for a  follow-up appointment in 2 months.

## 2022-01-22 ENCOUNTER — Telehealth: Payer: Self-pay | Admitting: Interventional Cardiology

## 2022-01-22 NOTE — Telephone Encounter (Signed)
Pt c/o of Chest Pain: STAT if CP now or developed within 24 hours  1. Are you having CP right now? no  2. Are you experiencing any other symptoms (ex. SOB, nausea, vomiting, sweating)? sob  3. How long have you been experiencing CP? About ten min after taking pill when it happens   4. Is your CP continuous or coming and going? Comes and goes   5. Have you taken Nitroglycerin? Yes  Pt is also stating that all of his teeth are going to have to be removed due to his medication rotting his teeth and would like you to advise him further in regards to this as well. ?

## 2022-01-22 NOTE — Telephone Encounter (Signed)
Left message for pt to call back to office r/t his previous call.

## 2022-01-22 NOTE — Telephone Encounter (Signed)
Pt returned call to office stating that he has been experiencing CP for the last month that is mid-sternal and radiates into L shoulder. States that the pain occurs each time he "gets up and gets to moving around." Pt states this morning when he was cleaning he felt his heart racing and the pain set in. States the pain typically goes away with rest, but didn't today. Pt took 1 NTG tablet approx 11:30am and sat down. Pain subsided approx 2pm. Pt took only 1 dose. Pt condones sweating associated with pain episodes and "some nausea" but no vomiting. Attests to some recent swelling in his legs that he props them up for. States weight on 2/17 when at diabetic doctor was 194lb and his usual is 186ish pounds, current weight 190 lb. States breathing is not labored, unless he feels his heart "doing its thing." Informed pt that I will let MD know, but that the NTG should have worked quicker than 2 hours, which may indicate a new blockage or narrowing. Gave pt ED precautions.

## 2022-01-25 ENCOUNTER — Other Ambulatory Visit (HOSPITAL_BASED_OUTPATIENT_CLINIC_OR_DEPARTMENT_OTHER): Payer: Self-pay

## 2022-01-26 ENCOUNTER — Other Ambulatory Visit (HOSPITAL_BASED_OUTPATIENT_CLINIC_OR_DEPARTMENT_OTHER): Payer: Self-pay

## 2022-01-26 DIAGNOSIS — Z03818 Encounter for observation for suspected exposure to other biological agents ruled out: Secondary | ICD-10-CM | POA: Diagnosis not present

## 2022-01-26 DIAGNOSIS — J069 Acute upper respiratory infection, unspecified: Secondary | ICD-10-CM | POA: Diagnosis not present

## 2022-01-26 MED ORDER — ALBUTEROL SULFATE HFA 108 (90 BASE) MCG/ACT IN AERS
INHALATION_SPRAY | RESPIRATORY_TRACT | 0 refills | Status: DC
  Filled 2022-01-26: qty 8.5, 17d supply, fill #0

## 2022-01-27 ENCOUNTER — Encounter: Payer: Self-pay | Admitting: Endocrinology

## 2022-01-27 ENCOUNTER — Ambulatory Visit: Payer: Medicare Other | Admitting: Family Medicine

## 2022-01-27 NOTE — Telephone Encounter (Signed)
I spoke with patient and gave him information from Dr Varanasi 

## 2022-01-27 NOTE — Telephone Encounter (Signed)
[name], MD  You; You; LATHAM, [facility] K 14 hours ago (5:36 PM)   He has had on and off sx through the years.  Several negative caths. Would continue to monitor his sx and let us know how things go.    Left message for patient to call office

## 2022-01-29 ENCOUNTER — Other Ambulatory Visit (HOSPITAL_BASED_OUTPATIENT_CLINIC_OR_DEPARTMENT_OTHER): Payer: Self-pay

## 2022-02-04 ENCOUNTER — Other Ambulatory Visit (HOSPITAL_BASED_OUTPATIENT_CLINIC_OR_DEPARTMENT_OTHER): Payer: Self-pay

## 2022-02-04 ENCOUNTER — Encounter: Payer: Self-pay | Admitting: Family Medicine

## 2022-02-05 ENCOUNTER — Other Ambulatory Visit: Payer: Self-pay

## 2022-02-05 DIAGNOSIS — R519 Headache, unspecified: Secondary | ICD-10-CM

## 2022-02-05 DIAGNOSIS — R42 Dizziness and giddiness: Secondary | ICD-10-CM

## 2022-02-08 ENCOUNTER — Other Ambulatory Visit (HOSPITAL_BASED_OUTPATIENT_CLINIC_OR_DEPARTMENT_OTHER): Payer: Self-pay

## 2022-02-08 ENCOUNTER — Other Ambulatory Visit: Payer: Self-pay | Admitting: Family Medicine

## 2022-02-08 MED ORDER — MECLIZINE HCL 25 MG PO TABS
25.0000 mg | ORAL_TABLET | Freq: Three times a day (TID) | ORAL | 0 refills | Status: DC | PRN
  Filled 2022-02-08: qty 30, 10d supply, fill #0

## 2022-02-12 ENCOUNTER — Emergency Department (HOSPITAL_BASED_OUTPATIENT_CLINIC_OR_DEPARTMENT_OTHER): Payer: Medicare Other | Admitting: Radiology

## 2022-02-12 ENCOUNTER — Emergency Department (HOSPITAL_BASED_OUTPATIENT_CLINIC_OR_DEPARTMENT_OTHER): Payer: Medicare Other

## 2022-02-12 ENCOUNTER — Other Ambulatory Visit: Payer: Self-pay

## 2022-02-12 ENCOUNTER — Observation Stay (HOSPITAL_BASED_OUTPATIENT_CLINIC_OR_DEPARTMENT_OTHER)
Admission: EM | Admit: 2022-02-12 | Discharge: 2022-02-14 | Disposition: A | Discharge status: Home / Self Care | Payer: Medicare Other | Attending: Internal Medicine | Admitting: Internal Medicine

## 2022-02-12 ENCOUNTER — Encounter (HOSPITAL_BASED_OUTPATIENT_CLINIC_OR_DEPARTMENT_OTHER): Payer: Self-pay | Admitting: Obstetrics and Gynecology

## 2022-02-12 DIAGNOSIS — Z794 Long term (current) use of insulin: Secondary | ICD-10-CM | POA: Diagnosis not present

## 2022-02-12 DIAGNOSIS — K219 Gastro-esophageal reflux disease without esophagitis: Secondary | ICD-10-CM | POA: Diagnosis not present

## 2022-02-12 DIAGNOSIS — Z951 Presence of aortocoronary bypass graft: Secondary | ICD-10-CM | POA: Insufficient documentation

## 2022-02-12 DIAGNOSIS — Z7901 Long term (current) use of anticoagulants: Secondary | ICD-10-CM | POA: Diagnosis not present

## 2022-02-12 DIAGNOSIS — D509 Iron deficiency anemia, unspecified: Secondary | ICD-10-CM | POA: Insufficient documentation

## 2022-02-12 DIAGNOSIS — R42 Dizziness and giddiness: Secondary | ICD-10-CM | POA: Insufficient documentation

## 2022-02-12 DIAGNOSIS — I10 Essential (primary) hypertension: Secondary | ICD-10-CM | POA: Diagnosis not present

## 2022-02-12 DIAGNOSIS — Z79899 Other long term (current) drug therapy: Secondary | ICD-10-CM | POA: Diagnosis not present

## 2022-02-12 DIAGNOSIS — G2581 Restless legs syndrome: Secondary | ICD-10-CM | POA: Diagnosis not present

## 2022-02-12 DIAGNOSIS — E1165 Type 2 diabetes mellitus with hyperglycemia: Secondary | ICD-10-CM

## 2022-02-12 DIAGNOSIS — M7989 Other specified soft tissue disorders: Secondary | ICD-10-CM | POA: Diagnosis not present

## 2022-02-12 DIAGNOSIS — N1832 Chronic kidney disease, stage 3b: Secondary | ICD-10-CM | POA: Diagnosis not present

## 2022-02-12 DIAGNOSIS — E782 Mixed hyperlipidemia: Secondary | ICD-10-CM | POA: Diagnosis not present

## 2022-02-12 DIAGNOSIS — Z96651 Presence of right artificial knee joint: Secondary | ICD-10-CM | POA: Diagnosis not present

## 2022-02-12 DIAGNOSIS — E162 Hypoglycemia, unspecified: Secondary | ICD-10-CM | POA: Diagnosis present

## 2022-02-12 DIAGNOSIS — Z7982 Long term (current) use of aspirin: Secondary | ICD-10-CM | POA: Insufficient documentation

## 2022-02-12 DIAGNOSIS — R600 Localized edema: Secondary | ICD-10-CM | POA: Diagnosis not present

## 2022-02-12 DIAGNOSIS — I2089 Other forms of angina pectoris: Secondary | ICD-10-CM | POA: Diagnosis present

## 2022-02-12 DIAGNOSIS — E11649 Type 2 diabetes mellitus with hypoglycemia without coma: Secondary | ICD-10-CM | POA: Diagnosis not present

## 2022-02-12 DIAGNOSIS — Z20822 Contact with and (suspected) exposure to covid-19: Secondary | ICD-10-CM | POA: Insufficient documentation

## 2022-02-12 DIAGNOSIS — G40909 Epilepsy, unspecified, not intractable, without status epilepticus: Secondary | ICD-10-CM

## 2022-02-12 DIAGNOSIS — I480 Paroxysmal atrial fibrillation: Secondary | ICD-10-CM | POA: Diagnosis not present

## 2022-02-12 DIAGNOSIS — F419 Anxiety disorder, unspecified: Secondary | ICD-10-CM | POA: Diagnosis present

## 2022-02-12 DIAGNOSIS — Z8249 Family history of ischemic heart disease and other diseases of the circulatory system: Secondary | ICD-10-CM | POA: Insufficient documentation

## 2022-02-12 DIAGNOSIS — R079 Chest pain, unspecified: Secondary | ICD-10-CM | POA: Diagnosis present

## 2022-02-12 DIAGNOSIS — I208 Other forms of angina pectoris: Secondary | ICD-10-CM | POA: Diagnosis present

## 2022-02-12 DIAGNOSIS — I25119 Atherosclerotic heart disease of native coronary artery with unspecified angina pectoris: Principal | ICD-10-CM | POA: Insufficient documentation

## 2022-02-12 DIAGNOSIS — I209 Angina pectoris, unspecified: Secondary | ICD-10-CM | POA: Diagnosis not present

## 2022-02-12 DIAGNOSIS — E785 Hyperlipidemia, unspecified: Secondary | ICD-10-CM | POA: Diagnosis present

## 2022-02-12 DIAGNOSIS — E1122 Type 2 diabetes mellitus with diabetic chronic kidney disease: Secondary | ICD-10-CM | POA: Diagnosis not present

## 2022-02-12 LAB — RESP PANEL BY RT-PCR (FLU A&B, COVID) ARPGX2
Influenza A by PCR: NEGATIVE
Influenza B by PCR: NEGATIVE
SARS Coronavirus 2 by RT PCR: NEGATIVE

## 2022-02-12 LAB — BASIC METABOLIC PANEL
Anion gap: 8 (ref 5–15)
BUN: 24 mg/dL — ABNORMAL HIGH (ref 8–23)
CO2: 26 mmol/L (ref 22–32)
Calcium: 9.4 mg/dL (ref 8.9–10.3)
Chloride: 105 mmol/L (ref 98–111)
Creatinine, Ser: 1.61 mg/dL — ABNORMAL HIGH (ref 0.61–1.24)
GFR, Estimated: 45 mL/min — ABNORMAL LOW (ref >60)
Glucose, Bld: 188 mg/dL — ABNORMAL HIGH (ref 70–99)
Potassium: 3.7 mmol/L (ref 3.5–5.1)
Sodium: 139 mmol/L (ref 135–145)

## 2022-02-12 LAB — HEPATIC FUNCTION PANEL
ALT: 16 U/L (ref 0–44)
AST: 21 U/L (ref 15–41)
Albumin: 4.3 g/dL (ref 3.5–5.0)
Alkaline Phosphatase: 53 U/L (ref 38–126)
Bilirubin, Direct: 0.2 mg/dL (ref 0.0–0.2)
Indirect Bilirubin: 0.5 mg/dL (ref 0.3–0.9)
Total Bilirubin: 0.7 mg/dL (ref 0.3–1.2)
Total Protein: 7.3 g/dL (ref 6.5–8.1)

## 2022-02-12 LAB — GLUCOSE, CAPILLARY: Glucose-Capillary: 108 mg/dL — ABNORMAL HIGH (ref 70–99)

## 2022-02-12 LAB — CBC
HCT: 36.5 % — ABNORMAL LOW (ref 39.0–52.0)
Hemoglobin: 12.2 g/dL — ABNORMAL LOW (ref 13.0–17.0)
MCH: 29.4 pg (ref 26.0–34.0)
MCHC: 33.4 g/dL (ref 30.0–36.0)
MCV: 88 fL (ref 80.0–100.0)
Platelets: 286 10*3/uL (ref 150–400)
RBC: 4.15 MIL/uL — ABNORMAL LOW (ref 4.22–5.81)
RDW: 14.7 % (ref 11.5–15.5)
WBC: 9 10*3/uL (ref 4.0–10.5)
nRBC: 0 % (ref 0.0–0.2)

## 2022-02-12 LAB — CBG MONITORING, ED: Glucose-Capillary: 43 mg/dL — CL (ref 70–99)

## 2022-02-12 LAB — TROPONIN I (HIGH SENSITIVITY)
Troponin I (High Sensitivity): 4 ng/L (ref <18)
Troponin I (High Sensitivity): 4 ng/L (ref <18)

## 2022-02-12 MED ORDER — DIVALPROEX SODIUM 500 MG PO DR TAB
500.0000 mg | DELAYED_RELEASE_TABLET | Freq: Two times a day (BID) | ORAL | Status: DC
  Administered 2022-02-13 – 2022-02-14 (×2): 500 mg via ORAL
  Filled 2022-02-12 (×3): qty 1

## 2022-02-12 MED ORDER — NITROGLYCERIN 0.3 MG SL SUBL: 0.3000 mg | SUBLINGUAL_TABLET | SUBLINGUAL | Status: DC | PRN

## 2022-02-12 MED ORDER — ACETAMINOPHEN 650 MG RE SUPP: 650.0000 mg | Freq: Four times a day (QID) | RECTAL | Status: DC | PRN

## 2022-02-12 MED ORDER — BUSPIRONE HCL 15 MG PO TABS: 7.5000 mg | ORAL_TABLET | Freq: Two times a day (BID) | ORAL | Status: DC

## 2022-02-12 MED ORDER — FENOFIBRATE 160 MG PO TABS
160.0000 mg | ORAL_TABLET | Freq: Every day | ORAL | Status: DC
  Administered 2022-02-13 – 2022-02-14 (×2): 160 mg via ORAL
  Filled 2022-02-12 (×2): qty 1

## 2022-02-12 MED ORDER — DEXTROSE 50 % IV SOLN
1.0000 | INTRAVENOUS | Status: DC | PRN
  Administered 2022-02-14: 50 mL via INTRAVENOUS
  Filled 2022-02-12: qty 50

## 2022-02-12 MED ORDER — ACETAMINOPHEN 325 MG PO TABS
650.0000 mg | ORAL_TABLET | Freq: Four times a day (QID) | ORAL | Status: DC | PRN
  Administered 2022-02-13: 650 mg via ORAL
  Filled 2022-02-12: qty 2

## 2022-02-12 MED ORDER — RANOLAZINE ER 500 MG PO TB12
500.0000 mg | ORAL_TABLET | Freq: Two times a day (BID) | ORAL | Status: DC
  Administered 2022-02-13 – 2022-02-14 (×4): 500 mg via ORAL
  Filled 2022-02-12 (×4): qty 1

## 2022-02-12 MED ORDER — NITROGLYCERIN 0.4 MG SL SUBL: 0.4000 mg | SUBLINGUAL_TABLET | SUBLINGUAL | Status: DC | PRN

## 2022-02-12 MED ORDER — PANTOPRAZOLE SODIUM 40 MG PO TBEC
40.0000 mg | DELAYED_RELEASE_TABLET | Freq: Every day | ORAL | Status: DC
  Administered 2022-02-13 – 2022-02-14 (×2): 40 mg via ORAL
  Filled 2022-02-12 (×2): qty 1

## 2022-02-12 MED ORDER — FAMOTIDINE 20 MG PO TABS
20.0000 mg | ORAL_TABLET | Freq: Two times a day (BID) | ORAL | Status: DC
  Administered 2022-02-13 – 2022-02-14 (×4): 20 mg via ORAL
  Filled 2022-02-12 (×4): qty 1

## 2022-02-12 MED ORDER — HYDROCORTISONE 1 % EX CREA
1.0000 "application " | TOPICAL_CREAM | Freq: Three times a day (TID) | CUTANEOUS | Status: DC | PRN
  Administered 2022-02-13: 1 via TOPICAL
  Filled 2022-02-12 (×2): qty 28

## 2022-02-12 MED ORDER — ASPIRIN EC 81 MG PO TBEC
81.0000 mg | DELAYED_RELEASE_TABLET | Freq: Every day | ORAL | Status: DC
  Administered 2022-02-13 – 2022-02-14 (×2): 81 mg via ORAL
  Filled 2022-02-12 (×2): qty 1

## 2022-02-12 MED ORDER — PRAMIPEXOLE DIHYDROCHLORIDE 1 MG PO TABS
1.0000 mg | ORAL_TABLET | Freq: Every day | ORAL | Status: DC
  Administered 2022-02-13 (×2): 1 mg via ORAL
  Filled 2022-02-12 (×3): qty 1

## 2022-02-12 MED ORDER — APIXABAN 5 MG PO TABS
5.0000 mg | ORAL_TABLET | Freq: Two times a day (BID) | ORAL | Status: DC
  Administered 2022-02-13 – 2022-02-14 (×4): 5 mg via ORAL
  Filled 2022-02-12 (×4): qty 1

## 2022-02-12 MED ORDER — INSULIN GLARGINE-YFGN 100 UNIT/ML ~~LOC~~ SOLN
20.0000 [IU] | Freq: Every day | SUBCUTANEOUS | Status: DC
  Administered 2022-02-13 – 2022-02-14 (×2): 20 [IU] via SUBCUTANEOUS
  Filled 2022-02-12 (×2): qty 0.2

## 2022-02-12 MED ORDER — METOPROLOL SUCCINATE ER 50 MG PO TB24: 75.0000 mg | ORAL_TABLET | Freq: Every day | ORAL | Status: DC

## 2022-02-12 MED ORDER — ATORVASTATIN CALCIUM 80 MG PO TABS
80.0000 mg | ORAL_TABLET | Freq: Every day | ORAL | Status: DC
  Administered 2022-02-13 (×2): 80 mg via ORAL
  Filled 2022-02-12 (×2): qty 1

## 2022-02-12 MED ORDER — INSULIN ASPART 100 UNIT/ML IJ SOLN: 0.0000 [IU] | Freq: Three times a day (TID) | INTRAMUSCULAR | Status: DC

## 2022-02-12 NOTE — ED Notes (Signed)
Patient transported to XRAY at this Time via Wheelchair. ?

## 2022-02-12 NOTE — ED Notes (Signed)
[facility] at the Bedside. 

## 2022-02-12 NOTE — H&P (Signed)
History and Physical    PLEASE NOTE THAT DRAGON DICTATION SOFTWARE WAS USED IN THE CONSTRUCTION OF THIS NOTE.   [name] [name] ASN:[id number] DOB: [birth year] DOA: 02/12/2022  PCP: [name], MD  Patient coming from: home   I have personally briefly reviewed patient's old medical records in [place]  Chief Complaint: chest pain  HPI: [name] [name] is a 76 y.o. male with medical history significant for coronary artery disease status post CABG in 2000, type 2 diabetes mellitus, generalized anxiety disorder, hypertension, GERD, hyperlipidemia, paroxysmal atrial fibrillation chronically anticoagulated on Eliquis, stage IIIb CKD with baseline creatinine 1.6-2.0, who is admitted to [facility] on 02/12/2022 by way of transfer from [facility] emergency department for further evaluation and management of presenting chest pain.  The patient reports that he has been experiencing exertional chest discomfort intermittently for at least the last several months.  This pain typically is relieved with 1-2 sublingual nitroglycerin as well as with rest.  Until the last 2 weeks, he reports that the frequency of this exertional chest discomfort has been an episode every few days.  However, over the last 2 weeks, the frequency of these episodes of nonradiating substernal chest pressure have increased such that he is experiencing similar episodes of chest discomfort every 1 to 2 days.  In spite of the increase in frequency, he reports that these episodes, while exertional, continue to resolve with 1-2 sublingual nitroglycerin, without increasing nitroglycerin requirements, and also improved with rest.  Episodes are nonpositional nonpleuritic, not reproducible with direct palpation of the anterior chest wall.  He notes that these episodes are associated with mild shortness of breath in the absence of any cough, hemoptysis, orthopnea, PND, or subjective fever, chills, rigors, or generalized  myalgias.  States that these episodes are not associated with any nausea, vomiting, diaphoresis, or palpitations.    In the setting of a known history of coronary disease status post CABG in 2000, he follows with [name] cardiology as outpatient cardiologist.  Over the last 2 weeks in the setting of increasing frequency of this chest discomfort, he reportedly was started on Ranexa, but has not noted any significant improvement in the frequency of the aforementioned chest pain episodes.  He conveys that his most recent episode of chest discomfort occurred on the afternoon of 02/11/2022, and was similar in intensity, distribution to the intermittent episodes that he has been experiencing over the course of the last 2 weeks.  He notes that this episode was exertional and resolved with sublingual nitroglycerin x1 as well as with rest.  Subsequently, he has been chest pain-free.  However, in the context of the recurrent nature of these intermittent episodes, the patient presents to [facility] emergency department today for further evaluation and management thereof.  Over the last 2 weeks, he is also noted mild swelling involving the right lower extremity, in the absence of any calf tenderness.  He notes good compliance with his chronic anticoagulation on Eliquis in the setting of a history of paroxysmal atrial fibrillation.  In addition to Eliquis, he is also on a daily baby aspirin as well as atorvastatin 80 mg p.o. daily as well as fenofibrate.  Outpatient cardiac regimen also notable for metoprolol succinate.  Most recent coronary angiography was reported to have occurred in February 2021.Of note, most recent echocardiogram occurred in February 2021 was notable for LVEF 60 to 65%, no evidence of focal wall motion normalities, indeterminate diastolic parameters, and mild mitral regurgitation.  [facility] ED Course:  Vital signs in the ED were notable for the following: Afebrile; heart rate  53-62; blood pressure 109/51- 129/56 mmHg; respiratory rate 16-17, oxygen saturation 97 to 100% on room air.  Labs were notable for the following: CMP notable for the following: Potassium 3.7, creatinine 1.61, liver enzymes within normal limits.  High-sensitivity troponin I x2 values were both found to be 4.  CBC notable for white cell count 10,000, hemoglobin 12.2 compared to most recent prior value of 10.9 on 10/05/2021.  COVID-19/influenza PCR negative.  Imaging and additional notable ED work-up: EKG, in comparison to most recent prior EKG from 08/30/2021, shows sinus rhythm with right bundle branch block, heart rate 71, T wave inversion in leads V1 and V3, which appear unchanged relative to most recent prior EKG, and no evidence of ST changes, including no evidence of ST elevation.  Venous ultrasound of the right lower extremity shows no evidence of DVT.  Two-view chest x-ray shows no evidence of acute cardiopulmonary process, clear evidence of infiltrate, edema, effusion, or pneumothorax.  EDP discussed the patient's case with the on-call [facility] cardiologist, Dr. Oval Linsey, Who recommended transfer to [facility] for admission to the hospitalist service for further ischemic evaluation, and will formally consult.   Subsequently, the patient was transferred to [facility] for further evaluation and management of presenting chest pain.      Review of Systems: As per HPI otherwise 10 point review of systems negative.   Past Medical History:  Diagnosis Date   Acquired atrophy of thyroid 06/05/2013   Overview:  July 2014: controlled on synthroid 37mg since 2012 May 2015: decreased to Synthroid 50 mcg  Last Assessment & Plan:  Pt doing well with most recent TSH within normal range.  Will continue current dosage of Synthroid 535m. Pt reminded to take medication on empty stomach. Will continue to monitor with periodic laboratory assessment in 3 months.    Anemia    hemoglobin 7.4, iron deficiency,  January, 2011, 2 unit transfusion, endoscopy normal, capsule endoscopy February, 2011 no small bowel abnormalities.   Most likely source gastric erosions, followed by GI   Anxiety    Bradycardia    CAD (coronary artery disease)    A. CABG in 2000,status post cardiac cath in 2006, 2009 ....continued chest pain and SOB despite oral medication adjestments including Ranexa. B. Cath November 2009/ mRCA - 2.75 x 23 Abbott Xience V drug-eluting stent ...11/26/2008 to distal  RCA leading to acute marginal.  C. Cath 07/2012 for CP - stable anatomy, med rx. d. cath 2015 and 05/30/2015 stable anatomy, consider Myoview if has CP again   Carotid artery disease ([place]08/26/2016   Doppler, May 29, 2015, 1-39% bilateral ICA    Cerebral ischemia    MRI November, 2010, chronic microvascular ischemia   CKD (chronic kidney disease), stage III ([place]   Concussion    Depression    Bipolar   Diabetes mellitus ([facility]   Edema    Essential hypertension 06/05/2013   Overview:  July 2014: Controlled with Bystolic Sep [id number] to atenolol.  Last Assessment & Plan:  Will change to atenolol for cost and follow Improved.  Medication compliance strongly encouraged BP: 116/64 mmHg    Overview:  July 2014: Controlled with Bystolic Sep [id number] to atenolol.  Last Assessment & Plan:  Will change to atenolol for cost and follow   Falling episodes    these have occurred in the past and again recurring 2011  Family history of adverse reaction to anesthesia    "mother died during bypass surgery but not sure if it has to do with anesthesia"   Gastric ulcer    GERD (gastroesophageal reflux disease)    H/O medication noncompliance    Due to loss of insurance   H/O multiple concussions    Hard of hearing    Heart murmur    History of blood transfusion 12/20/2013   History of kidney stones    Hx of CABG    2000,  / one median sternotomy suture broken her chest x-ray November, 2010, no clinical significance    Hyperlipidemia    Hypertension    pt. denies   Iron deficiency anemia    Long term (current) use of anticoagulants [Z79.01] 07/20/2016   Low back pain 06/12/2009   Qualifier: Diagnosis of  By: [name]    Nephrolithiasis    Orthostasis    OSA (obstructive sleep apnea)    PAF (paroxysmal atrial fibrillation) ([place])    a. dx 2017, started on amiodarone/warfarin but patient intermittently noncompliant.   PSVT (paroxysmal supraventricular tachycardia) ([place])    RBBB 07/09/2009   Qualifier: Diagnosis of  By: [name], MD, [facility], [name]    RLS (restless legs syndrome) 09/19/2009   Qualifier: Diagnosis of  By: [name]   Overview:  July 2014: Controlled with Mirapex  Last Assessment & Plan:  Patient is doing well. Will continue current management and follow clinically.   Seizure disorder ([place]) 01/09/2017   Spondylosis    C5-6, C6-7 MRI 2010   Syncope 03/2016   TBI (traumatic brain injury) 2019   Thyroid disease    Tubulovillous adenoma of colon 2007   Vitamin D deficiency 05/17/2017   Wears glasses     Past Surgical History:  Procedure Laterality Date   ANTERIOR CERVICAL DECOMP/DISCECTOMY FUSION N/A 05/31/2016   Procedure: ANTERIOR CERVICAL DECOMPRESSION/DISCECTOMY FUSION CERVICAL FIVE-SIX,CERVICAL SIX-SEVEN;  Surgeon: [name], MD;  Location: [place] NEURO ORS;  Service: Neurosurgery;  Laterality: N/A;   CARDIAC CATHETERIZATION N/A 05/30/2015   Procedure: Left Heart Cath and Coronary Angiography;  Surgeon: [name], MD;  Location: [place] CV LAB;  Service: Cardiovascular;  Laterality: N/A;   COLONOSCOPY     CORONARY ARTERY BYPASS GRAFT     2000   ESOPHAGOGASTRODUODENOSCOPY     LEFT HEART CATH AND CORS/GRAFTS ANGIOGRAPHY N/A 12/15/2017   Procedure: LEFT HEART CATH AND CORS/GRAFTS ANGIOGRAPHY;  Surgeon: [name], MD;  Location: [place] CV LAB;  Service: Cardiovascular;  Laterality: N/A;   LEFT HEART CATHETERIZATION WITH CORONARY/GRAFT ANGIOGRAM  N/A 08/01/2012   Procedure: LEFT HEART CATHETERIZATION WITH Beatrix Fetters;  Surgeon: [name], MD;  Location: [facility] CATH LAB;  Service: Cardiovascular;  Laterality: N/A;   LEFT HEART CATHETERIZATION WITH CORONARY/GRAFT ANGIOGRAM N/A 01/03/2015   Procedure: LEFT HEART CATHETERIZATION WITH Beatrix Fetters;  Surgeon: [name], MD;  Location[facility] CATH LAB;  Service: Cardiovascular;  Laterality: N/A;   NASAL SEPTUM SURGERY     UP3   PERCUTANEOUS CORONARY STENT INTERVENTION (PCI-S)  10/2008   mRCA PCI  2.75 x 23 Abbott Xience V drug-eluting stent    RIGHT/LEFT HEART CATH AND CORONARY/GRAFT ANGIOGRAPHY N/A 01/07/2020   Procedure: RIGHT/LEFT HEART CATH AND CORONARY/GRAFT ANGIOGRAPHY;  Surgeon: [name], MD;  Location: [place] CV LAB;  Service: Cardiovascular;  Laterality: N/A;   TOTAL KNEE ARTHROPLASTY Right 09/22/2021   Procedure: RIGHT TOTAL KNEE ARTHROPLASTY;  Surgeon:  [name], MD;  Location: [facility] ORS;  Service: Orthopedics;  Laterality: Right;   ULTRASOUND GUIDANCE FOR VASCULAR ACCESS  12/15/2017   Procedure: Ultrasound Guidance For Vascular Access;  Surgeon: [name], MD;  Location: [place] CV LAB;  Service: Cardiovascular;;    Social History:  reports that he has never smoked. He has never been exposed to tobacco smoke. He has never used smokeless tobacco. He reports that he does not drink alcohol and does not use drugs.   Allergies  Allergen Reactions   Morphine Other (See Comments)    Hallucinations   Shellfish-Derived Products Itching    Family History  Problem Relation Age of Onset   Pancreatic cancer Brother    Diabetes Brother    Coronary artery disease Brother    Stroke Brother    Diabetes Brother    Diabetes Mother    Heart failure Mother    Heart failure Father    Hypothyroidism Brother    Coronary artery disease Brother    Other Brother        colon surgery   Heart attack Other        Nephew   Irregular  heart beat Daughter    Cancer Maternal Grandmother        unknown    Colon cancer Neg Hx    Stomach cancer Neg Hx    Liver cancer Neg Hx    Rectal cancer Neg Hx    Esophageal cancer Neg Hx     Family history reviewed and not pertinent    Prior to Admission medications   Medication Sig Start Date End Date Taking? Authorizing Provider  albuterol (VENTOLIN HFA) 108 (90 Base) MCG/ACT inhaler Inhale 2 puffs into the lungs every 6 (six) hours as needed for wheezing or shortness of breath. 06/25/19   [name], MD  albuterol (VENTOLIN HFA) 108 (90 Base) MCG/ACT inhaler Inhale  2 puffs by mouth every 4 hours as needed for shortness of breath 01/26/22     amLODipine (NORVASC) 5 MG tablet TAKE 1 TABLET BY MOUTH ONCE DAILY 05/01/21 05/01/22  [name], MD  apixaban (ELIQUIS) 5 MG TABS tablet Take 1 tablet (5 mg total) by mouth 2 (two) times daily. 06/10/21   [name], MD  aspirin EC 81 MG tablet Take 81 mg by mouth daily. Swallow whole.    [provider]  atorvastatin (LIPITOR) 80 MG tablet Take 1 tablet (80 mg total) by mouth daily. Patient taking differently: Take 80 mg by mouth at bedtime. 03/09/21   [name], MD  busPIRone (BUSPAR) 7.5 MG tablet Take 1 tablet (7.5 mg total) by mouth 2 (two) times daily. 09/01/21   [name], MD  cetirizine (ZYRTEC) 10 MG tablet TAKE 1 TABLET (10 MG TOTAL) BY MOUTH DAILY. 03/03/21 03/03/22  [name], MD  Continuous Blood Gluc Sensor (FREESTYLE LIBRE 2 SENSOR) MISC Use as instructed to check blood sugar, change every 14 days 12/10/21   Renato Shin, MD  Continuous Blood Gluc Sensor (FREESTYLE LIBRE 2 SENSOR) MISC Use as directed every 14 days 12/17/21   Renato Shin, MD  COVID-19 mRNA bivalent vaccine, Pfizer, (PFIZER COVID-19 VAC BIVALENT) injection Inject into the muscle. 11/03/21   Carlyle Basques, MD  Diclofenac Sodium (PENNSAID) 2 % SOLN Place 1 application onto the skin 2 (two) times daily. Patient taking  differently: Place 1 application onto the skin 2 (two) times daily as needed (pain). 10/17/20   Rosemarie Ax,  MD  divalproex (DEPAKOTE) 500 MG DR tablet Take 1 tablet (500 mg total) by mouth 2 (two) times daily. 07/07/21   Kathrynn Ducking, MD  famotidine (PEPCID) 20 MG tablet TAKE 1 TABLET BY MOUTH 2 TIMES DAILY 06/23/21 06/23/22  [name], MD  fenofibrate (TRICOR) 145 MG tablet TAKE 1 TABLET (145 MG TOTAL) BY MOUTH DAILY. 06/10/21 06/10/22  [name], MD  furosemide (LASIX) 20 MG tablet TAKE 1 TABLET BY MOUTH DAILY AS NEEDED FOR EDEMA 01/07/22 01/07/23  [name], MD  glucose blood (ACCU-CHEK AVIVA) test strip Use as directed to check blood sugar 4 times daily.  DX E11.9 Patient taking differently: Use as directed to check blood sugar 4 times daily.  DX E11.9 02/05/19   [name], MD  hydrocortisone 2.5 % cream Apply on to the skin 2 (two) times daily. 06/24/21 06/24/22  [name], MD  hydrOXYzine (ATARAX) 10 MG tablet Take 1 tablet (10 mg total) by mouth 3 (three) times daily as needed for anxiety or itching. 01/07/22   [name], MD  influenza vaccine adjuvanted (FLUAD) 0.5 ML injection Inject into the muscle. 11/03/21   Carlyle Basques, MD  insulin glargine, 2 Unit Dial, (TOUJEO MAX SOLOSTAR) 300 UNIT/ML Solostar Pen Inject 84 Units into the skin every morning. And pen needles 1/day 01/19/22   Renato Shin, MD  isosorbide mononitrate (IMDUR) 120 MG 24 hr tablet TAKE 1 TABLET (120 MG TOTAL) BY MOUTH DAILY. 10/23/20 01/12/22  [name], MD  levothyroxine (SYNTHROID) 100 MCG tablet TAKE 1 TABLET (100 MCG TOTAL) BY MOUTH DAILY. 09/11/21 09/11/22  [name], MD  meclizine (ANTIVERT) 25 MG tablet Take 1 tablet (25 mg total) by mouth 3 (three) times daily as needed for dizziness. 02/08/22   [name], MD  metoprolol succinate (TOPROL-XL) 50 MG 24 hr tablet TAKE 1 AND 1/2 TABLETS EVERY DAY FOR A TOTAL OF 75 MG 11/20/20   [name], MD  nitroGLYCERIN  (NITROSTAT) 0.4 MG SL tablet PLACE 1 TABLET UNDER THE TONGUE EVERY 5 MINUTES AS NEEDED FOR CHEST PAIN. 05/20/20   [name], MD  ondansetron (ZOFRAN) 4 MG tablet Take 1 tablet (4 mg total) by mouth every 8 (eight) hours as needed for nausea or vomiting. 10/05/21   [name], MD  pantoprazole (PROTONIX) 40 MG tablet TAKE 1 TABLET BY MOUTH EVERY DAY 06/30/21   [name], MD  pramipexole (MIRAPEX) 1 MG tablet Take 1/2 to 1 tablet by mouth at bedtime 01/07/22   [name], MD  ranolazine (RANEXA) 500 MG 12 hr tablet TAKE 1 TABLET BY MOUTH TWICE DAILY 01/07/22 01/07/23  [name], MD  sertraline (ZOLOFT) 100 MG tablet TAKE 1 TABLET BY MOUTH TWICE DAILY 05/01/21 05/01/22  [name], MD  sulfamethoxazole-trimethoprim (BACTRIM DS) 800-160 MG tablet Take 1 tablet by mouth 2 (two) times daily. 09/29/21   Kathrynn Ducking, MD  tiZANidine (ZANAFLEX) 4 MG tablet TAKE 1 TABLET BY MOUTH EVERY 6 HOURS AS NEEDED FOR MUSCLE SPASMS 04/29/21 04/29/22  [name], MD  traMADol (ULTRAM) 50 MG tablet Take 1 tablet (50 mg total) by mouth every 6 (six) hours as needed. 09/29/21   Kathrynn Ducking, MD  triamcinolone cream (KENALOG) 0.1 % Apply 1 application topically 2 (two) times daily. Use as needed for itchy rash Patient taking differently: Apply 1 application topically 2 (two) times daily as needed (itchy rash). Use as needed  for itchy rash 05/22/21   [name], MD  triamcinolone cream (KENALOG) 0.5 % Apply 1 application on the skin twice a day 07/24/21     Vitamin D, Ergocalciferol, (DRISDOL) 1.25 MG (50000 UNIT) CAPS capsule TAKE 1 CAPSULE BY MOUTH EVERY 7 DAYS 12/08/21 12/08/22  [name], MD     Objective    Physical Exam: Vitals:   02/12/22 1700 02/12/22 1745 02/12/22 1905 02/12/22 2111  BP: (!) 107/56 (!) 103/54 113/74 (!) 129/56  Pulse: (!) 54 (!) 55 (!) 55 62  Resp: '10 17 16 14  '$ Temp:   (!) 97.5 F (36.4 C) 97.8 F (36.6 C)  TempSrc:   Oral Oral  SpO2: 100% 98% 97%  98%  Weight:      Height:        General: appears to be stated age; alert, oriented Skin: warm, dry, no rash Head:  AT/[place] Mouth:  Oral mucosa membranes appear moist, normal dentition Neck: supple; trachea midline Heart:  RRR; did not appreciate any M/R/G Lungs: CTAB, did not appreciate any wheezes, rales, or rhonchi Abdomen: + BS; soft, ND, NT Vascular: 2+ pedal pulses b/l; 2+ radial pulses b/l Extremities: no peripheral edema, no muscle wasting Neuro: strength and sensation intact in upper and lower extremities b/l  Labs on Admission: I have personally reviewed following labs and imaging studies  CBC: Recent Labs  Lab 02/12/22 1450  WBC 9.0  HGB 12.2*  HCT 36.5*  MCV 88.0  PLT 867   Basic Metabolic Panel: Recent Labs  Lab 02/12/22 1450  NA 139  K 3.7  CL 105  CO2 26  GLUCOSE 188*  BUN 24*  CREATININE 1.61*  CALCIUM 9.4   GFR: Estimated Creatinine Clearance: 44.4 mL/min (A) (by C-G formula based on SCr of 1.61 mg/dL (H)). Liver Function Tests: Recent Labs  Lab 02/12/22 1457  AST 21  ALT 16  ALKPHOS 53  BILITOT 0.7  PROT 7.3  ALBUMIN 4.3   No results for input(s): LIPASE, AMYLASE in the last 168 hours. No results for input(s): AMMONIA in the last 168 hours. Coagulation Profile: No results for input(s): INR, PROTIME in the last 168 hours. Cardiac Enzymes: No results for input(s): CKTOTAL, CKMB, CKMBINDEX, TROPONINI in the last 168 hours. BNP (last 3 results) No results for input(s): PROBNP in the last 8760 hours. HbA1C: No results for input(s): HGBA1C in the last 72 hours. CBG: Recent Labs  Lab 02/12/22 2011 02/12/22 2106  GLUCAP 43* 108*   Lipid Profile: No results for input(s): CHOL, HDL, LDLCALC, TRIG, CHOLHDL, LDLDIRECT in the last 72 hours. Thyroid Function Tests: No results for input(s): TSH, T4TOTAL, FREET4, T3FREE, THYROIDAB in the last 72 hours. Anemia Panel: No results for input(s): VITAMINB12, FOLATE, FERRITIN, TIBC, IRON,  RETICCTPCT in the last 72 hours. Urine analysis:    Component Value Date/Time   COLORURINE YELLOW 08/30/2021 1905   APPEARANCEUR CLEAR 08/30/2021 1905   LABSPEC 1.010 08/30/2021 1905   PHURINE 6.5 08/30/2021 1905   GLUCOSEU >=500 (A) 08/30/2021 1905   GLUCOSEU >=1000 (A) 08/20/2019 0924   HGBUR NEGATIVE 08/30/2021 1905   HGBUR negative 12/10/2010 1354   BILIRUBINUR NEGATIVE 08/30/2021 1905   BILIRUBINUR neg 03/06/2013 [place] 08/30/2021 1905   PROTEINUR NEGATIVE 08/30/2021 1905   UROBILINOGEN 0.2 08/20/2019 0924   NITRITE NEGATIVE 08/30/2021 1905   LEUKOCYTESUR NEGATIVE 08/30/2021 1905    Radiological Exams on Admission: DG Chest 2 View  Result Date: 02/12/2022 CLINICAL DATA:  chest  pain EXAM: CHEST - 2 VIEW COMPARISON:  Chest x-ray 03/25/2021, CT chest 03/22/2016 FINDINGS: Cardiovascular surgical changes overlie the mediastinum. Query associated coronary artery stent. The heart and mediastinal contours are unchanged. No focal consolidation. No pulmonary edema. No pleural effusion. No pneumothorax. No acute osseous abnormality. IMPRESSION: No active cardiopulmonary disease. Electronically Signed   By: Iven Finn [name]   On: 02/12/2022 15:17   CT Head Wo Contrast  Result Date: 02/12/2022 CLINICAL DATA:  Dizziness, persistent/recurrent, cardiac or vascular cause suspected EXAM: CT HEAD WITHOUT CONTRAST TECHNIQUE: Contiguous axial images were obtained from the base of the skull through the vertex without intravenous contrast. RADIATION DOSE REDUCTION: This exam was performed according to the departmental dose-optimization program which includes automated exposure control, adjustment of the mA and/or kV according to patient size and/or use of iterative reconstruction technique. COMPARISON:  08/30/2021. FINDINGS: Brain: No evidence of acute large vascular territory infarction, hemorrhage, hydrocephalus, extra-axial collection or mass lesion/mass effect. Similar cerebral  atrophy and mild chronic microvascular ischemic disease. Vascular: No hyperdense vessel identified. Calcific intracranial atherosclerosis. Skull: No acute fracture. Sinuses/Orbits: Mild scattered paranasal sinus mucosal thickening with secretions in the left frontal sinus. No acute orbital findings. Other: No mastoid effusions. IMPRESSION: 1. No evidence of acute intracranial abnormality. 2. Similar atrophy and chronic microvascular ischemic disease. Electronically Signed   By: Margaretha Sheffield [name]   On: 02/12/2022 19:12   US Venous Img Lower Unilateral Right (DVT)  Result Date: 02/12/2022 CLINICAL DATA:  Right lower leg swelling EXAM: RIGHT LOWER EXTREMITY VENOUS DOPPLER ULTRASOUND TECHNIQUE: Gray-scale sonography with compression, as well as color and duplex ultrasound, were performed to evaluate the deep venous system(s) from the level of the common femoral vein through the popliteal and proximal calf veins. COMPARISON:  07/19/2018. FINDINGS: VENOUS Normal compressibility of the common femoral, superficial femoral, and popliteal veins, as well as the visualized calf veins. Visualized portions of profunda femoral vein unremarkable. No filling defects to suggest DVT on grayscale or color Doppler imaging. Doppler waveforms show normal direction of venous flow, normal respiratory plasticity and response to augmentation. Limited views of the contralateral common femoral vein are unremarkable. OTHER None. Limitations: none IMPRESSION: Negative for DVT in the right lower extremity Electronically Signed   By: Merilyn Baba [name]   On: 02/12/2022 17:32     EKG: Independently reviewed, with result as described above.    Assessment/Plan   Principal Problem:   Chest pain Active Problems:   RLS (restless legs syndrome)   GERD (gastroesophageal reflux disease)   Essential hypertension   HLD (hyperlipidemia)   PAF (paroxysmal atrial fibrillation) (HCC)   Seizure disorder (HCC)   Hypoglycemia    Anxiety     #) Chest Pain: The patient presents with 2 weeks of increased frequency, relative to baseline, of typical sounding chest discomfort in the form of intermittent episodes of substernal chest pressure that is exertional, improves with 1-2 sublingual nitroglycerin as well as with rest.  While he has been experiencing similar episodes of substernal chest pressure for several months, the frequency of these episodes has increased over the last 2 weeks, raising the possibility of unstable angina in this patient with a known history of coronary artery disease status post CABG in 2000, with most recent coronary angiography occurring in February 2021, as further detailed above.  Currently chest pain-free.  High-sensitivity troponin high x2 values nonelevated at 4 on both occasions.  EKG shows no evidence of acute ischemic changes, but no evidence of ST elevation,  while chest x-ray shows no evidence of acute cardiopulmonary process, including no evidence of pneumothorax.  Currently chest pain-free.  Acute pulmonary embolism is felt to be less likely in the context of patient's reported good compliance with chronic anticoagulation on Eliquis in the setting of paroxysmal atrial fibrillation.  Case was discussed with the on-call [facility] cardiologist, Dr. Oval Linsey, Who recommended admission for further ischemic evaluation, and will formally consult, with additional recs pending at this time.     Plan: trend serial troponin. Monitor on telemetry. PRN sublingual nitroglycerin.  PRN EKG for subsequent episodes of chest pain. Check serum Mg level and repeat BMP in the morning. Repeat CBC in the AM.  Echo ordered for the morning.  Cardiology consulted, as above.  Continue home aspirin as well as home high intensity atorvastatin, with next dose now.  Continue home beta-blocker.  Check lipase.      #) Hypoglycemia: In the context of a documented history of type 2 diabetes mellitus, with most recent hemoglobin A1c  is 7.6% on 01/19/2022 and outpatient basal insulin consisting of Toujeo 84 units sq QAM, presenting blood sugar noted to be 41.  Patient asymptomatic, and blood sugar has improved to greater than 100 following interval food and beverage.  Not on any short acting insulin as an outpatient nor any oral hypoglycemic agents including no sulfonylureas.  We will reduce dose of basal insulin for now, to reduce risk for ensuing hypoglycemia, as further quantified below.  Plan: Reduce basal insulin to Lantus 20 units subcu every morning.  CBG every hour x3 occurrences followed by before every meal and at bedtime with low-dose sliding scale insulin.  Hypoglycemic protocol initiated, including prn amp of D50 for CBG less than 70.       #) Generalized anxiety sorter: On BuSpar as an outpatient.  Plan: Continue BuSpar.         #) Essential Hypertension: documented h/o such, with outpatient antihypertensive regimen including amlodipine, metoprolol succinate.  SBP's in the ED today: Normotensive.  However, in the setting of presenting will sounding chest pain, will hold home amlodipine for now given relative contraindication to calcium channel blockers in the setting of ACS.  Plan: Close monitoring of subsequent BP via routine VS. hold home amlodipine for now, as above.  Continue beta-blocker.          #) GERD: documented h/o such; on Pepcid as well as Protonix as outpatient.   Plan: continue home PPI and Pepcid.            #) Hyperlipidemia: documented h/o such. On high intensity atorvastatin as outpatient.    Plan: continue home statin.        #) Chronic iron deficiency anemia: Documented history of such, with baseline hemoglobin range of 11-13, with presenting hemoglobin consistent with this range, in the absence of any evidence of active bleed.  Plan: Repeat CBC in the morning.          #) Paroxysmal atrial fibrillation: Documented history of such. In setting of  CHA2DS2-VASc score of 5, there is an indication for chronic anticoagulation for thromboembolic prophylaxis. Consistent with this, patient is chronically anticoagulated on Eliquis. Home AV nodal blocking regimen: Metoprolol succinate.  Most recent echocardiogram occurred in February 2021, as further detailed above. Presenting EKG sinus rhythm without evidence of acute ischemic changes.    Plan: monitor strict I's & O's and daily weights. Repeat BMP/CBC in AM. Check serum mag level. Continue home AV nodal blocking regimen.  Continue home Eliquis.       #)  Seizure disorder: Document history of such, on Depakote as an outpatient.  No evidence of active seizures at this time.  Plan: Continue home Depakote therapy.        #) Restless leg syndrome: On Mirapex as an outpatient.  Plan: Continue home Mirapex.       #) Stage IIIb chronic kidney disease: Documented history of such, with baseline creatinine range noted to be 1.6-2.0, with presenting serum creatinine consistent with his baseline range.   Plan: Monitor strict I's and O's and daily weights.  Tempt avoid nephrotoxic agents.  Repeat BMP in the morning.      DVT prophylaxis: Home Eliquis Code Status: Full code Family Communication: none Disposition Plan: Per Rounding Team Consults called: case discussed with on-call [place] cardiology, Dr. Oval Linsey, as further detailed above;  Admission status: Inpatient, med telemetry   PLEASE NOTE THAT DRAGON DICTATION SOFTWARE WAS USED IN THE CONSTRUCTION OF THIS NOTE.   [place]anna DO [facility]  From [place]   02/12/2022, 10:27 PM
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
## 2022-02-12 NOTE — Assessment & Plan Note (Signed)
#)   Hypoglycemia: In the context of a documented history of type 2 diabetes mellitus, with most recent hemoglobin A1c is 7.6% on 01/19/2022 and outpatient basal insulin consisting of Toujeo 84 units sq QAM, presenting blood sugar noted to be 41.  Patient asymptomatic, and blood sugar has improved to greater than 100 following interval food and beverage.  Not on any short acting insulin as an outpatient nor any oral hypoglycemic agents including no sulfonylureas.  We will reduce dose of basal insulin for now, to reduce risk for ensuing hypoglycemia, as further quantified below. ?  ?Plan: Reduce basal insulin to Lantus 20 units subcu every morning.  CBG every hour x3 occurrences followed by before every meal and at bedtime with low-dose sliding scale insulin.  Hypoglycemic protocol initiated, including prn amp of D50 for CBG less than 70. ?  ?  ?  ?

## 2022-02-12 NOTE — Assessment & Plan Note (Signed)
#)   GERD: documented h/o such; on Pepcid as well as Protonix as outpatient.  ?  ?Plan: continue home PPI and Pepcid.  ?

## 2022-02-12 NOTE — Assessment & Plan Note (Signed)
#)   Paroxysmal atrial fibrillation: Documented history of such. In setting of CHA2DS2-VASc score of 5, there is an indication for chronic anticoagulation for thromboembolic prophylaxis. Consistent with this, patient is chronically anticoagulated on Eliquis. Home AV nodal blocking regimen: Metoprolol succinate.  Most recent echocardiogram occurred in February 2021, as further detailed above. Presenting EKG sinus rhythm without evidence of acute ischemic changes.  ?  ?  ?Plan: monitor strict I's & O's and daily weights. Repeat BMP/CBC in AM. Check serum mag level. Continue home AV nodal blocking regimen.  Continue home Eliquis. ?  ?

## 2022-02-12 NOTE — ED Notes (Signed)
Patient reports he also has had dizziness and head pains and falling frequently.  ?

## 2022-02-12 NOTE — Assessment & Plan Note (Signed)
#)   Essential Hypertension: documented h/o such, with outpatient antihypertensive regimen including amlodipine, metoprolol succinate.  SBP's in the ED today: Normotensive.  However, in the setting of presenting will sounding chest pain, will hold home amlodipine for now given relative contraindication to calcium channel blockers in the setting of ACS. ?  ?Plan: Close monitoring of subsequent BP via routine VS. hold home amlodipine for now, as above.  Continue beta-blocker. ?  ?

## 2022-02-12 NOTE — ED Notes (Signed)
Patient transported to CT at this Time. ?

## 2022-02-12 NOTE — ED Notes (Signed)
Patient has remained NPO for the Duration of the Shift. CBG assessed and CBG of 43 discovered. MD made aware and Patient provided with Cola and Catheryn Bacon. Patient has no complaints besides being hungry. No Changes in Mental Status and Patient comfortable.  ? ?[facility] and Lavella Lemons, RN at [facility] made aware. [facility] at the Bedside.  ?

## 2022-02-12 NOTE — ED Triage Notes (Signed)
Patient reports he has been having chest pain for "a while". Patient states he would take a nitro tab and rest and it would recede. Patient reports he has steadily be getting worsening shortness of breath. Patient also has a yellowish tint to his skin that his daughter reports is new.  ?

## 2022-02-12 NOTE — Assessment & Plan Note (Signed)
#)   Chest Pain: The patient presents with 2 weeks of increased frequency, relative to baseline, of typical sounding chest discomfort in the form of intermittent episodes of substernal chest pressure that is exertional, improves with 1-2 sublingual nitroglycerin as well as with rest.  While he has been experiencing similar episodes of substernal chest pressure for several months, the frequency of these episodes has increased over the last 2 weeks, raising the possibility of unstable angina in this patient with a known history of coronary artery disease status post CABG in 2000, with most recent coronary angiography occurring in February 2021, as further detailed above.  Currently chest pain-free.  High-sensitivity troponin high x2 values nonelevated at 4 on both occasions.  EKG shows no evidence of acute ischemic changes, but no evidence of ST elevation, while chest x-ray shows no evidence of acute cardiopulmonary process, including no evidence of pneumothorax.  Currently chest pain-free.  Acute pulmonary embolism is felt to be less likely in the context of patient's reported good compliance with chronic anticoagulation on Eliquis in the setting of paroxysmal atrial fibrillation. ?  ?Case was discussed with the on-call [facility] cardiologist, Dr. Oval Linsey, Who recommended admission for further ischemic evaluation, and will formally consult, with additional recs pending at this time.  ?  ?  ?  ?Plan: trend serial troponin. Monitor on telemetry. PRN sublingual nitroglycerin.  PRN EKG for subsequent episodes of chest pain. Check serum Mg level and repeat BMP in the morning. Repeat CBC in the AM.  Echo ordered for the morning.  Cardiology consulted, as above.  Continue home aspirin as well as home high intensity atorvastatin, with next dose now.  Continue home beta-blocker.  Check lipase. ?  ?

## 2022-02-12 NOTE — ED Notes (Signed)
Patient Report/Care Handoff given to Lavella Lemons, Therapist, sports at [facility]. All Questions answered at this Time. ?

## 2022-02-12 NOTE — Assessment & Plan Note (Signed)
   #)   Hyperlipidemia: documented h/o such. On high intensity atorvastatin as outpatient.    Plan: continue home statin.      

## 2022-02-12 NOTE — Assessment & Plan Note (Signed)
?#)   Restless leg syndrome: On Mirapex as an outpatient. ?  ?Plan: Continue home Mirapex. ?  ?

## 2022-02-12 NOTE — ED Notes (Signed)
Patient returned from CT at this Time. ?

## 2022-02-12 NOTE — Assessment & Plan Note (Signed)
#)   Generalized anxiety sorter: On BuSpar as an outpatient. ?  ?Plan: Continue BuSpar. ?  ?  ?

## 2022-02-12 NOTE — ED Notes (Signed)
Called [facility] to transport patient to [place] room 6 ?

## 2022-02-12 NOTE — ED Notes (Signed)
Patient ambulatory to [facility] with [facility].  ?

## 2022-02-12 NOTE — ED Notes (Signed)
Patient returned from XRAY 

## 2022-02-12 NOTE — Assessment & Plan Note (Signed)
#)   Seizure disorder: Document history of such, on Depakote as an outpatient.  No evidence of active seizures at this time. ?  ?Plan: Continue home Depakote therapy. ?

## 2022-02-13 ENCOUNTER — Encounter (HOSPITAL_COMMUNITY): Payer: Self-pay | Admitting: Internal Medicine

## 2022-02-13 ENCOUNTER — Other Ambulatory Visit: Payer: Self-pay

## 2022-02-13 ENCOUNTER — Observation Stay (HOSPITAL_BASED_OUTPATIENT_CLINIC_OR_DEPARTMENT_OTHER): Payer: Medicare Other

## 2022-02-13 DIAGNOSIS — F419 Anxiety disorder, unspecified: Secondary | ICD-10-CM | POA: Diagnosis not present

## 2022-02-13 DIAGNOSIS — R079 Chest pain, unspecified: Secondary | ICD-10-CM | POA: Diagnosis not present

## 2022-02-13 DIAGNOSIS — I208 Other forms of angina pectoris: Secondary | ICD-10-CM

## 2022-02-13 DIAGNOSIS — E119 Type 2 diabetes mellitus without complications: Secondary | ICD-10-CM

## 2022-02-13 DIAGNOSIS — K219 Gastro-esophageal reflux disease without esophagitis: Secondary | ICD-10-CM | POA: Diagnosis not present

## 2022-02-13 DIAGNOSIS — R55 Syncope and collapse: Secondary | ICD-10-CM | POA: Diagnosis not present

## 2022-02-13 DIAGNOSIS — I10 Essential (primary) hypertension: Secondary | ICD-10-CM | POA: Diagnosis not present

## 2022-02-13 DIAGNOSIS — G2581 Restless legs syndrome: Secondary | ICD-10-CM | POA: Diagnosis not present

## 2022-02-13 DIAGNOSIS — E782 Mixed hyperlipidemia: Secondary | ICD-10-CM | POA: Diagnosis not present

## 2022-02-13 DIAGNOSIS — I480 Paroxysmal atrial fibrillation: Secondary | ICD-10-CM | POA: Diagnosis not present

## 2022-02-13 DIAGNOSIS — E162 Hypoglycemia, unspecified: Secondary | ICD-10-CM | POA: Diagnosis not present

## 2022-02-13 LAB — CBC WITH DIFFERENTIAL/PLATELET
Abs Immature Granulocytes: 0.02 10*3/uL (ref 0.00–0.07)
Basophils Absolute: 0 10*3/uL (ref 0.0–0.1)
Basophils Relative: 1 %
Eosinophils Absolute: 0.5 10*3/uL (ref 0.0–0.5)
Eosinophils Relative: 6 %
HCT: 33.8 % — ABNORMAL LOW (ref 39.0–52.0)
Hemoglobin: 11.8 g/dL — ABNORMAL LOW (ref 13.0–17.0)
Immature Granulocytes: 0 %
Lymphocytes Relative: 25 %
Lymphs Abs: 2 10*3/uL (ref 0.7–4.0)
MCH: 30.5 pg (ref 26.0–34.0)
MCHC: 34.9 g/dL (ref 30.0–36.0)
MCV: 87.3 fL (ref 80.0–100.0)
Monocytes Absolute: 0.5 10*3/uL (ref 0.1–1.0)
Monocytes Relative: 6 %
Neutro Abs: 4.9 10*3/uL (ref 1.7–7.7)
Neutrophils Relative %: 62 %
Platelets: 252 10*3/uL (ref 150–400)
RBC: 3.87 MIL/uL — ABNORMAL LOW (ref 4.22–5.81)
RDW: 14.5 % (ref 11.5–15.5)
WBC: 7.9 10*3/uL (ref 4.0–10.5)
nRBC: 0 % (ref 0.0–0.2)

## 2022-02-13 LAB — TROPONIN I (HIGH SENSITIVITY)
Troponin I (High Sensitivity): 8 ng/L (ref <18)
Troponin I (High Sensitivity): 7 ng/L (ref <18)

## 2022-02-13 LAB — COMPREHENSIVE METABOLIC PANEL
ALT: 17 U/L (ref 0–44)
AST: 23 U/L (ref 15–41)
Albumin: 3.5 g/dL (ref 3.5–5.0)
Alkaline Phosphatase: 51 U/L (ref 38–126)
Anion gap: 7 (ref 5–15)
BUN: 19 mg/dL (ref 8–23)
CO2: 24 mmol/L (ref 22–32)
Calcium: 9 mg/dL (ref 8.9–10.3)
Chloride: 108 mmol/L (ref 98–111)
Creatinine, Ser: 1.51 mg/dL — ABNORMAL HIGH (ref 0.61–1.24)
GFR, Estimated: 48 mL/min — ABNORMAL LOW (ref >60)
Glucose, Bld: 165 mg/dL — ABNORMAL HIGH (ref 70–99)
Potassium: 3.7 mmol/L (ref 3.5–5.1)
Sodium: 139 mmol/L (ref 135–145)
Total Bilirubin: 0.4 mg/dL (ref 0.3–1.2)
Total Protein: 6.1 g/dL — ABNORMAL LOW (ref 6.5–8.1)

## 2022-02-13 LAB — GLUCOSE, CAPILLARY
Glucose-Capillary: 121 mg/dL — ABNORMAL HIGH (ref 70–99)
Glucose-Capillary: 83 mg/dL (ref 70–99)
Glucose-Capillary: 127 mg/dL — ABNORMAL HIGH (ref 70–99)
Glucose-Capillary: 122 mg/dL — ABNORMAL HIGH (ref 70–99)

## 2022-02-13 LAB — ECHOCARDIOGRAM COMPLETE
Area-P 1/2: 3.21 cm2
Height: 69 in
S' Lateral: 2.9 cm
Weight: 3040 oz

## 2022-02-13 LAB — MRSA NEXT GEN BY PCR, NASAL: MRSA by PCR Next Gen: NOT DETECTED

## 2022-02-13 LAB — LIPASE, BLOOD: Lipase: 25 U/L (ref 11–51)

## 2022-02-13 LAB — MAGNESIUM: Magnesium: 2 mg/dL (ref 1.7–2.4)

## 2022-02-13 MED ORDER — MELATONIN 1 MG PO TABS: 2.0000 mg | ORAL_TABLET | Freq: Every evening | ORAL | Status: DC | PRN

## 2022-02-13 MED ORDER — MELATONIN 3 MG PO TABS
1.5000 mg | ORAL_TABLET | Freq: Every evening | ORAL | Status: DC | PRN
  Administered 2022-02-14: 1.5 mg via ORAL
  Filled 2022-02-13: qty 1

## 2022-02-13 NOTE — Care Management Obs Status (Signed)
MEDICARE OBSERVATION STATUS NOTIFICATION ? ? ?Patient Details  ?Name: [name] [name] ?MRN: [id number] ?Date of Birth: [birth year] ? ? ?Medicare Observation Status Notification Given:  Yes ? ? ? ?Carles Collet, RN ?02/13/2022, 9:12 AM ?

## 2022-02-13 NOTE — Care Management CC44 (Signed)
Condition Code 44 Documentation Completed ? ?Patient Details  ?Name: [name] [name] ?MRN: [id number] ?Date of Birth: 1946-08-28 ? ? ?Condition Code 44 given:  Yes ?Patient signature on Condition Code 44 notice:  Yes ?Documentation of 2 MD's agreement:  Yes ?Code 44 added to claim:  Yes ? ? ? ?Carles Collet, RN ?02/13/2022, 9:12 AM ? ?

## 2022-02-13 NOTE — Discharge Summary (Incomplete)
Physician Discharge Summary   Patient: [name] [name] MRN: [id number] DOB: 20-Nov-1946  Admit date:     02/12/2022  Discharge date: {dischdate:26783}  Discharge Physician: Little Ishikawa   PCP: [name], MD   Recommendations at discharge:  {Tip this will not be part of the note when signed- Example include specific recommendations for outpatient follow-up, pending tests to follow-up on. (Optional):26781}  ***  Discharge Diagnoses: Principal Problem:   Chest pain Active Problems:   RLS (restless legs syndrome)   GERD (gastroesophageal reflux disease)   Essential hypertension   HLD (hyperlipidemia)   PAF (paroxysmal atrial fibrillation) (HCC)   Seizure disorder (HCC)   Hypoglycemia   Anxiety  Resolved Problems:   * No resolved hospital problems. *  Hospital Course: No notes on file  Assessment and Plan: * Chest pain #) Chest Pain: The patient presents with 2 weeks of increased frequency, relative to baseline, of typical sounding chest discomfort in the form of intermittent episodes of substernal chest pressure that is exertional, improves with 1-2 sublingual nitroglycerin as well as with rest.  While he has been experiencing similar episodes of substernal chest pressure for several months, the frequency of these episodes has increased over the last 2 weeks, raising the possibility of unstable angina in this patient with a known history of coronary artery disease status post CABG in 2000, with most recent coronary angiography occurring in February 2021, as further detailed above.  Currently chest pain-free.  High-sensitivity troponin high x2 values nonelevated at 4 on both occasions.  EKG shows no evidence of acute ischemic changes, but no evidence of ST elevation, while chest x-ray shows no evidence of acute cardiopulmonary process, including no evidence of pneumothorax.  Currently chest pain-free.  Acute pulmonary embolism is felt to be less likely in the context of  patient's reported good compliance with chronic anticoagulation on Eliquis in the setting of paroxysmal atrial fibrillation.   Case was discussed with the on-call [facility] cardiologist, Dr. Oval Linsey, Who recommended admission for further ischemic evaluation, and will formally consult, with additional recs pending at this time.        Plan: trend serial troponin. Monitor on telemetry. PRN sublingual nitroglycerin.  PRN EKG for subsequent episodes of chest pain. Check serum Mg level and repeat BMP in the morning. Repeat CBC in the AM.  Echo ordered for the morning.  Cardiology consulted, as above.  Continue home aspirin as well as home high intensity atorvastatin, with next dose now.  Continue home beta-blocker.  Check lipase.    Anxiety #) Generalized anxiety sorter: On BuSpar as an outpatient.   Plan: Continue BuSpar.      Hypoglycemia #) Hypoglycemia: In the context of a documented history of type 2 diabetes mellitus, with most recent hemoglobin A1c is 7.6% on 01/19/2022 and outpatient basal insulin consisting of Toujeo 84 units sq QAM, presenting blood sugar noted to be 41.  Patient asymptomatic, and blood sugar has improved to greater than 100 following interval food and beverage.  Not on any short acting insulin as an outpatient nor any oral hypoglycemic agents including no sulfonylureas.  We will reduce dose of basal insulin for now, to reduce risk for ensuing hypoglycemia, as further quantified below.   Plan: Reduce basal insulin to Lantus 20 units subcu every morning.  CBG every hour x3 occurrences followed by before every meal and at bedtime with low-dose sliding scale insulin.  Hypoglycemic protocol initiated, including prn amp of D50 for CBG less than 70.  Seizure disorder ([place]) #) Seizure disorder: Document history of such, on Depakote as an outpatient.  No evidence of active seizures at this time.   Plan: Continue home Depakote therapy.  PAF (paroxysmal atrial fibrillation)  (HCC) #) Paroxysmal atrial fibrillation: Documented history of such. In setting of CHA2DS2-VASc score of 5, there is an indication for chronic anticoagulation for thromboembolic prophylaxis. Consistent with this, patient is chronically anticoagulated on Eliquis. Home AV nodal blocking regimen: Metoprolol succinate.  Most recent echocardiogram occurred in February 2021, as further detailed above. Presenting EKG sinus rhythm without evidence of acute ischemic changes.      Plan: monitor strict I's & O's and daily weights. Repeat BMP/CBC in AM. Check serum mag level. Continue home AV nodal blocking regimen.  Continue home Eliquis.    HLD (hyperlipidemia) #) Hyperlipidemia: documented h/o such. On high intensity atorvastatin as outpatient.      Plan: continue home statin.     Essential hypertension #) Essential Hypertension: documented h/o such, with outpatient antihypertensive regimen including amlodipine, metoprolol succinate.  SBP's in the ED today: Normotensive.  However, in the setting of presenting will sounding chest pain, will hold home amlodipine for now given relative contraindication to calcium channel blockers in the setting of ACS.   Plan: Close monitoring of subsequent BP via routine VS. hold home amlodipine for now, as above.  Continue beta-blocker.    GERD (gastroesophageal reflux disease) #) GERD: documented h/o such; on Pepcid as well as Protonix as outpatient.    Plan: continue home PPI and Pepcid.   RLS (restless legs syndrome)   #) Restless leg syndrome: On Mirapex as an outpatient.   Plan: Continue home Mirapex.        {Tip this will not be part of the note when signed Body mass index is 28.06 kg/m. , ,  (Optional):26781}  {(NOTE) Pain control PDMP Statment (Optional):26782} Consultants: *** Procedures performed: ***  Disposition: {Plan; Disposition:26390} Diet recommendation:  {Diet_Plan:26776} DISCHARGE MEDICATION: Allergies as of 02/13/2022        Reactions   Morphine Other (See Comments)   Hallucinations   Shellfish-derived Products Itching     Med Rec must be completed prior to using this [facility]***       Discharge Exam: Filed Weights   02/12/22 1438  Weight: 86.2 kg   ***  Condition at discharge: {DC Condition:26389}  The results of significant diagnostics from this hospitalization (including imaging, microbiology, ancillary and laboratory) are listed below for reference.   Imaging Studies: DG Chest 2 View  Result Date: 02/12/2022 CLINICAL DATA:  chest pain EXAM: CHEST - 2 VIEW COMPARISON:  Chest x-ray 03/25/2021, CT chest 03/22/2016 FINDINGS: Cardiovascular surgical changes overlie the mediastinum. Query associated coronary artery stent. The heart and mediastinal contours are unchanged. No focal consolidation. No pulmonary edema. No pleural effusion. No pneumothorax. No acute osseous abnormality. IMPRESSION: No active cardiopulmonary disease. Electronically Signed   By: Iven Finn [name]   On: 02/12/2022 15:17   CT Head Wo Contrast  Result Date: 02/12/2022 CLINICAL DATA:  Dizziness, persistent/recurrent, cardiac or vascular cause suspected EXAM: CT HEAD WITHOUT CONTRAST TECHNIQUE: Contiguous axial images were obtained from the base of the skull through the vertex without intravenous contrast. RADIATION DOSE REDUCTION: This exam was performed according to the departmental dose-optimization program which includes automated exposure control, adjustment of the mA and/or kV according to patient size and/or use of iterative reconstruction technique. COMPARISON:  08/30/2021. FINDINGS: Brain: No evidence of acute large vascular territory infarction, hemorrhage, hydrocephalus, extra-axial  collection or mass lesion/mass effect. Similar cerebral atrophy and mild chronic microvascular ischemic disease. Vascular: No hyperdense vessel identified. Calcific intracranial atherosclerosis. Skull: No acute fracture. Sinuses/Orbits: Mild  scattered paranasal sinus mucosal thickening with secretions in the left frontal sinus. No acute orbital findings. Other: No mastoid effusions. IMPRESSION: 1. No evidence of acute intracranial abnormality. 2. Similar atrophy and chronic microvascular ischemic disease. Electronically Signed   By: Margaretha Sheffield [name]   On: 02/12/2022 19:12   US Venous Img Lower Unilateral Right (DVT)  Result Date: 02/12/2022 CLINICAL DATA:  Right lower leg swelling EXAM: RIGHT LOWER EXTREMITY VENOUS DOPPLER ULTRASOUND TECHNIQUE: Gray-scale sonography with compression, as well as color and duplex ultrasound, were performed to evaluate the deep venous system(s) from the level of the common femoral vein through the popliteal and proximal calf veins. COMPARISON:  07/19/2018. FINDINGS: VENOUS Normal compressibility of the common femoral, superficial femoral, and popliteal veins, as well as the visualized calf veins. Visualized portions of profunda femoral vein unremarkable. No filling defects to suggest DVT on grayscale or color Doppler imaging. Doppler waveforms show normal direction of venous flow, normal respiratory plasticity and response to augmentation. Limited views of the contralateral common femoral vein are unremarkable. OTHER None. Limitations: none IMPRESSION: Negative for DVT in the right lower extremity Electronically Signed   By: Merilyn Baba [name]   On: 02/12/2022 17:32    Microbiology: Results for orders placed or performed during the hospital encounter of 02/12/22  Resp Panel by RT-PCR (Flu A&B, Covid) Nasopharyngeal Swab     Status: None   Collection Time: 02/12/22  4:04 PM   Specimen: Nasopharyngeal Swab; Nasopharyngeal(NP) swabs in vial transport medium  Result Value Ref Range Status   SARS Coronavirus 2 by RT PCR NEGATIVE NEGATIVE Final    Comment: (NOTE) SARS-CoV-2 target nucleic acids are NOT DETECTED.  The SARS-CoV-2 RNA is generally detectable in upper respiratory specimens during the acute  phase of infection. The lowest concentration of SARS-CoV-2 viral copies this assay can detect is 138 copies/mL. A negative result does not preclude SARS-Cov-2 infection and should not be used as the sole basis for treatment or other patient management decisions. A negative result may occur with  improper specimen collection/handling, submission of specimen other than nasopharyngeal swab, presence of viral mutation(s) within the areas targeted by this assay, and inadequate number of viral copies(<138 copies/mL). A negative result must be combined with clinical observations, patient history, and epidemiological information. The expected result is Negative.  Fact Sheet for Patients:  EntrepreneurPulse.com.au  Fact Sheet for Healthcare Providers:  IncredibleEmployment.be  This test is no t yet approved or cleared by the [place] FDA and  has been authorized for detection and/or diagnosis of SARS-CoV-2 by FDA under an Emergency Use Authorization (EUA). This EUA will remain  in effect (meaning this test can be used) for the duration of the COVID-19 declaration under Section 564(b)(1) of the Act, 21 U.S.C.section 360bbb-3(b)(1), unless the authorization is terminated  or revoked sooner.       Influenza A by PCR NEGATIVE NEGATIVE Final   Influenza B by PCR NEGATIVE NEGATIVE Final    Comment: (NOTE) The Xpert Xpress SARS-CoV-2/FLU/RSV plus assay is intended as an aid in the diagnosis of influenza from Nasopharyngeal swab specimens and should not be used as a sole basis for treatment. Nasal washings and aspirates are unacceptable for Xpert Xpress SARS-CoV-2/FLU/RSV testing.  Fact Sheet for Patients: EntrepreneurPulse.com.au  Fact Sheet for Healthcare Providers: IncredibleEmployment.be  This test is not yet  approved or cleared by the [place] and has been authorized for detection and/or diagnosis of  SARS-CoV-2 by FDA under an Emergency Use Authorization (EUA). This EUA will remain in effect (meaning this test can be used) for the duration of the COVID-19 declaration under Section 564(b)(1) of the Act, 21 U.S.C. section 360bbb-3(b)(1), unless the authorization is terminated or revoked.  Performed at [facility], [address], [place], [place] [address]   MRSA Next Gen by PCR, Nasal     Status: None   Collection Time: 02/12/22  9:13 PM   Specimen: Nasal Mucosa; Nasal Swab  Result Value Ref Range Status   MRSA by PCR Next Gen NOT DETECTED NOT DETECTED Final    Comment: (NOTE) The GeneXpert MRSA Assay (FDA approved for NASAL specimens only), is one component of a comprehensive MRSA colonization surveillance program. It is not intended to diagnose MRSA infection nor to guide or monitor treatment for MRSA infections. Test performance is not FDA approved in patients less than 40 years old. Performed at [facility], [place] [address], [place], [place] [address]    *Note: Due to a large number of results and/or encounters for the requested time period, some results have not been displayed. A complete set of results can be found in Results Review.    Labs: CBC: Recent Labs  Lab 02/12/22 1450 02/13/22 0049  WBC 9.0 7.9  NEUTROABS  --  4.9  HGB 12.2* 11.8*  HCT 36.5* 33.8*  MCV 88.0 87.3  PLT 286 191   Basic Metabolic Panel: Recent Labs  Lab 02/12/22 1450 02/13/22 0049  NA 139 139  K 3.7 3.7  CL 105 108  CO2 26 24  GLUCOSE 188* 165*  BUN 24* 19  CREATININE 1.61* 1.51*  CALCIUM 9.4 9.0  MG  --  2.0   Liver Function Tests: Recent Labs  Lab 02/12/22 1457 02/13/22 0049  AST 21 23  ALT 16 17  ALKPHOS 53 51  BILITOT 0.7 0.4  PROT 7.3 6.1*  ALBUMIN 4.3 3.5   CBG: Recent Labs  Lab 02/12/22 2011 02/12/22 2106 02/13/22 0649  GLUCAP 43* 108* 121*    Discharge time spent: {LESS THAN/GREATER THAN:26388} 30  minutes.  Signed: Little Ishikawa, MD [facility] 02/13/2022

## 2022-02-13 NOTE — Plan of Care (Signed)
  Problem: Education: Goal: Understanding of cardiac disease, CV risk reduction, and recovery process will improve Outcome: Progressing Goal: Individualized Educational Video(s) Outcome: Progressing   Problem: Activity: Goal: Ability to tolerate increased activity will improve Outcome: Progressing   Problem: Cardiac: Goal: Ability to achieve and maintain adequate cardiovascular perfusion will improve Outcome: Progressing   Problem: Health Behavior/Discharge Planning: Goal: Ability to safely manage health-related needs after discharge will improve Outcome: Progressing   

## 2022-02-13 NOTE — Consult Note (Addendum)
Cardiology Consultation:   Patient ID: [name] [name] MRN: [id number]; DOB: 11/26/1946  Admit date: 02/12/2022 Date of Consult: 02/13/2022  PCP:  [name] Lukes, MD   [facility] Providers Cardiologist:  [name] Grooms, MD        Patient Profile:   [name] [name] is a 76 y.o. male with a PMH of CAD s/p CABG in 2000 with subsequent PCI/DES to RCA in 2009, HTN, HLD, paroxysmal atrial fibrillation, PSVT, DM type II, CKD stage III, anxiety, iron deficiency anemia, GERD, who is being seen 02/13/2022 for the evaluation of chest pain at the request of [name] Mail, PA-C.  History of Present Illness:   [name] [name] has been experiencing intermittent exertional chest pain for the past several months which would occur 2-3 times a week.  He reports that pain is typically relieved with 1-2 sublingual nitroglycerin and rest.  He called our office with these complaints and was recommended for ongoing watchful waiting given on the off symptoms over the past several years which correlate with negative cardiac catheterizations.  Over the past couple weeks he has noticed an increase in chest pain episodes, now occurring every 1 to 2 days which continued to resolve with 1-2 sublingual nitros.  These episodes are associated with mild shortness of breath but otherwise no nausea, vomiting, dizziness, lightheadedness, syncope, diaphoresis, palpitations, or reproducible pain.    He was last evaluated by cardiology as an outpatient visit with [name] [name] 03/2021 at which time he noted stable angina on isosorbide mononitrate 120 mg daily.  He was cleared for knee replacement surgery at that time given stable cardiovascular symptoms.  Ischemic evaluation was a [facility] 01/2020 which showed severe native disease with patent bypass grafts.  His last echocardiogram 01/2020 showed EF 60-65%, mild LVH of the basal septum, no RWMA, indeterminate LV diastolic function, and mild MR.  Hospital course: VSS. Labs notable  for electrolytes WNL, creatinine 1.6> 1.5, Hgb 11.8, PLT 252, HsTrop 4>4>8>7. EKG showed sinus rhythm, rate 71 bpm, chronic RBBB, chronic TWI in V2-3, no STE/D, overall unchanged from previous.  CXR showed no acute findings.  Given cardiac history decision was made to transfer the patient to [facility] for [facility] evaluation.  Cardiology asked to see.  At the time of this evaluation he is chest pain-free.  His only complaint is a headache.  He reports having frequent headaches and gait instability for the past several months.  In addition to intermittent chest pains he also reports several episodes of syncope in the past several months.  Last episode was 1 week ago.  He reports that these do not occur with any particular rhyme or reason.  He feels a sudden room spinning sensation and unsteadiness in his gait which results in falls and states he occasionally "blacks out".  These episodes sometimes occur shortly after standing but have also occurred when he has been walking around for a bit or even driving.  He reports he no longer drives due to these symptoms.  He tells me he has a visit with the neurology office next week.  He does not recall any chest pain or palpitations occurring with these syncopal events.  Typically his chest pain occurs with exertional activities like housecleaning.  He has increased his household chores over the past 6 months since the passing of his wife who unfortunately died after a complication with a GI procedure.  He has had occasional lower extremity edema, for which he takes Lasix 20 mg twice a day.  He does not have any complaints of shortness of breath at rest, orthopnea, PND.  He reports the episode that brought him to the ED 02/12/2022 was much sharper in nature than his typical chest pain which prompted him to seek further medical care.  Past Medical History:  Diagnosis Date   Acquired atrophy of thyroid 06/05/2013   Overview:  July 2014: controlled on synthroid 58mg since  2012 May 2015: decreased to Synthroid 50 mcg  Last Assessment & Plan:  Pt doing well with most recent TSH within normal range.  Will continue current dosage of Synthroid 577m. Pt reminded to take medication on empty stomach. Will continue to monitor with periodic laboratory assessment in 3 months.    Anemia    hemoglobin 7.4, iron deficiency, January, 2011, 2 unit transfusion, endoscopy normal, capsule endoscopy February, 2011 no small bowel abnormalities.   Most likely source gastric erosions, followed by GI   Anxiety    Bradycardia    CAD (coronary artery disease)    A. CABG in 2000,status post cardiac cath in 2006, 2009 ....continued chest pain and SOB despite oral medication adjestments including Ranexa. B. Cath November 2009/ mRCA - 2.75 x 23 Abbott Xience V drug-eluting stent ...11/26/2008 to distal  RCA leading to acute marginal.  C. Cath 07/2012 for CP - stable anatomy, med rx. d. cath 2015 and 05/30/2015 stable anatomy, consider Myoview if has CP again   Carotid artery disease ([place]/26/[place]   Doppler, May 29, 2015, 1-39% bilateral ICA    Cerebral ischemia    MRI November, 2010, chronic microvascular ischemia   CKD (chronic kidney disease), stage III ([place]   Concussion    Depression    Bipolar   Diabetes mellitus ([place]   Edema    Essential hypertension 06/05/2013   Overview:  July 2014: Controlled with Bystolic Sep [id number] to atenolol.  Last Assessment & Plan:  Will change to atenolol for cost and follow Improved.  Medication compliance strongly encouraged BP: 116/64 mmHg    Overview:  July 2014: Controlled with Bystolic Sep [id number] to atenolol.  Last Assessment & Plan:  Will change to atenolol for cost and follow   Falling episodes    these have occurred in the past and again recurring 2011   Family history of adverse reaction to anesthesia    "mother died during bypass surgery but not sure if it has to do with anesthesia"   Gastric ulcer    GERD (gastroesophageal  reflux disease)    H/O medication noncompliance    Due to loss of insurance   H/O multiple concussions    Hard of hearing    Heart murmur    History of blood transfusion 12/20/2013   History of kidney stones    Hx of CABG    2000,  / one median sternotomy suture broken her chest x-ray November, 2010, no clinical significance   Hyperlipidemia    Hypertension    pt. denies   Iron deficiency anemia    Long term (current) use of anticoagulants [Z79.01] 07/20/2016   Low back pain 06/12/2009   Qualifier: Diagnosis of  By: [name]  Nephrolithiasis    Orthostasis    OSA (obstructive sleep apnea)    PAF (paroxysmal atrial fibrillation) ([facility]   a. dx 2017, started on amiodarone/warfarin but patient intermittently noncompliant.   PSVT (paroxysmal supraventricular tachycardia) ([facility]   RBBB 07/09/2009   Qualifier: Diagnosis of  By: KaRon Parker  MD, [name] [name]    RLS (restless legs syndrome) 09/19/2009   Qualifier: Diagnosis of  By: [name]   Overview:  July 2014: Controlled with Mirapex  Last Assessment & Plan:  Patient is doing well. Will continue current management and follow clinically.   Seizure disorder ([place]) 01/09/2017   Spondylosis    C5-6, C6-7 MRI 2010   Syncope 03/2016   TBI (traumatic brain injury) 2019   Thyroid disease    Tubulovillous adenoma of colon 2007   Vitamin D deficiency 05/17/2017   Wears glasses     Past Surgical History:  Procedure Laterality Date   ANTERIOR CERVICAL DECOMP/DISCECTOMY FUSION N/A 05/31/2016   Procedure: ANTERIOR CERVICAL DECOMPRESSION/DISCECTOMY FUSION CERVICAL FIVE-SIX,CERVICAL SIX-SEVEN;  Surgeon: [name], MD;  Location: [place];  Service: Neurosurgery;  Laterality: N/A;   CARDIAC CATHETERIZATION N/A 05/30/2015   Procedure: Left Heart Cath and Coronary Angiography;  Surgeon: [name], MD;  Location: [place] [facility];  Service: Cardiovascular;  Laterality: N/A;   COLONOSCOPY     CORONARY ARTERY BYPASS  GRAFT     2000   ESOPHAGOGASTRODUODENOSCOPY     LEFT HEART CATH AND CORS/GRAFTS ANGIOGRAPHY N/A 12/15/2017   Procedure: LEFT HEART CATH AND CORS/GRAFTS ANGIOGRAPHY;  Surgeon: [name], MD;  Location: [place] [facility];  Service: Cardiovascular;  Laterality: N/A;   LEFT HEART CATHETERIZATION WITH CORONARY/GRAFT ANGIOGRAM N/A 08/01/2012   Procedure: LEFT HEART CATHETERIZATION WITH Beatrix Fetters;  Surgeon: [name], MD;  Location[facility] C[facility];  Service: Cardiovascular;  Laterality: N/A;   LEFT HEART CATHETERIZATION WITH CORONARY/GRAFT ANGIOGRAM N/A 01/03/2015   Procedure: LEFT HEART CATHETERIZATION WITH Beatrix Fetters;  Surgeon: [name], MD;  Location: [facility] CATH LAB;  Service: Cardiovascular;  Laterality: N/A;   NASAL SEPTUM SURGERY     UP3   PERCUTANEOUS CORONARY STENT INTERVENTION (PCI-S)  10/2008   mRCA PCI  2.75 x 23 Abbott Xience V drug-eluting stent    RIGHT/LEFT HEART CATH AND CORONARY/GRAFT ANGIOGRAPHY N/A 01/07/2020   Procedure: RIGHT/LEFT HEART CATH AND CORONARY/GRAFT ANGIOGRAPHY;  Surgeon: [name], MD;  Location: [place] CV LAB;  Service: Cardiovascular;  Laterality: N/A;   TOTAL KNEE ARTHROPLASTY Right 09/22/2021   Procedure: RIGHT TOTAL KNEE ARTHROPLASTY;  Surgeon: [name], MD;  Location: [facility];  Service: Orthopedics;  Laterality: Right;   ULTRASOUND GUIDANCE FOR VASCULAR ACCESS  12/15/2017   Procedure: Ultrasound Guidance For Vascular Access;  Surgeon: [name], MD;  Location: [place] CV LAB;  Service: Cardiovascular;;     Home Medications:  Prior to Admission medications   Medication Sig Start Date End Date Taking? Authorizing Provider  albuterol (VENTOLIN HFA) 108 (90 Base) MCG/ACT inhaler Inhale 2 puffs into the lungs every 6 (six) hours as needed for wheezing or shortness of breath. 06/25/19   [name] Lukes, MD  albuterol (VENTOLIN HFA) 108 (90 Base) MCG/ACT inhaler Inhale  2 puffs by mouth every 4  hours as needed for shortness of breath 01/26/22     amLODipine (NORVASC) 5 MG tablet TAKE 1 TABLET BY MOUTH ONCE DAILY 05/01/21 05/01/22  [name], MD  apixaban (ELIQUIS) 5 MG TABS tablet Take 1 tablet (5 mg total) by mouth 2 (two) times daily. 06/10/21   [name], MD  aspirin EC 81 MG tablet Take 81 mg by mouth daily. Swallow whole.    [provider]  atorvastatin (LIPITOR) 80 MG tablet Take 1 tablet (80 mg  total) by mouth daily. Patient taking differently: Take 80 mg by mouth at bedtime. 03/09/21   [name] Lukes, MD  busPIRone (BUSPAR) 7.5 MG tablet Take 1 tablet (7.5 mg total) by mouth 2 (two) times daily. 09/01/21   [name] Lukes, MD  cetirizine (ZYRTEC) 10 MG tablet TAKE 1 TABLET (10 MG TOTAL) BY MOUTH DAILY. 03/03/21 03/03/22  [name] Lukes, MD  Continuous Blood Gluc Sensor (FREESTYLE LIBRE 2 SENSOR) MISC Use as instructed to check blood sugar, change every 14 days 12/10/21   Renato Shin, MD  Continuous Blood Gluc Sensor (FREESTYLE LIBRE 2 SENSOR) MISC Use as directed every 14 days 12/17/21   Renato Shin, MD  COVID-19 mRNA bivalent vaccine, Pfizer, (PFIZER COVID-19 VAC BIVALENT) injection Inject into the muscle. 11/03/21   Carlyle Basques, MD  Diclofenac Sodium (PENNSAID) 2 % SOLN Place 1 application onto the skin 2 (two) times daily. Patient taking differently: Place 1 application onto the skin 2 (two) times daily as needed (pain). 10/17/20   Rosemarie Ax, MD  divalproex (DEPAKOTE) 500 MG DR tablet Take 1 tablet (500 mg total) by mouth 2 (two) times daily. 07/07/21   Kathrynn Ducking, MD  famotidine (PEPCID) 20 MG tablet TAKE 1 TABLET BY MOUTH 2 TIMES DAILY 06/23/21 06/23/22  [name] Lukes, MD  fenofibrate (TRICOR) 145 MG tablet TAKE 1 TABLET (145 MG TOTAL) BY MOUTH DAILY. 06/10/21 06/10/22  [name] Lukes, MD  furosemide (LASIX) 20 MG tablet TAKE 1 TABLET BY MOUTH DAILY AS NEEDED FOR EDEMA 01/07/22 01/07/23  [name] Lukes, MD  glucose blood (ACCU-CHEK AVIVA)  test strip Use as directed to check blood sugar 4 times daily.  DX E11.9 Patient taking differently: Use as directed to check blood sugar 4 times daily.  DX E11.9 02/05/19   [name] Lukes, MD  hydrocortisone 2.5 % cream Apply on to the skin 2 (two) times daily. 06/24/21 06/24/22  [name] Lukes, MD  hydrOXYzine (ATARAX) 10 MG tablet Take 1 tablet (10 mg total) by mouth 3 (three) times daily as needed for anxiety or itching. 01/07/22   [name] Lukes, MD  influenza vaccine adjuvanted (FLUAD) 0.5 ML injection Inject into the muscle. 11/03/21   Carlyle Basques, MD  insulin glargine, 2 Unit Dial, (TOUJEO MAX SOLOSTAR) 300 UNIT/ML Solostar Pen Inject 84 Units into the skin every morning. And pen needles 1/day 01/19/22   Renato Shin, MD  isosorbide mononitrate (IMDUR) 120 MG 24 hr tablet TAKE 1 TABLET (120 MG TOTAL) BY MOUTH DAILY. 10/23/20 01/12/22  [name], MD  levothyroxine (SYNTHROID) 100 MCG tablet TAKE 1 TABLET (100 MCG TOTAL) BY MOUTH DAILY. 09/11/21 09/11/22  [name] Lukes, MD  meclizine (ANTIVERT) 25 MG tablet Take 1 tablet (25 mg total) by mouth 3 (three) times daily as needed for dizziness. 02/08/22   [name] Lukes, MD  metoprolol succinate (TOPROL-XL) 50 MG 24 hr tablet TAKE 1 AND 1/2 TABLETS EVERY DAY FOR A TOTAL OF 75 MG 11/20/20   [name], MD  nitroGLYCERIN (NITROSTAT) 0.4 MG SL tablet PLACE 1 TABLET UNDER THE TONGUE EVERY 5 MINUTES AS NEEDED FOR CHEST PAIN. 05/20/20   [name] Lukes, MD  ondansetron (ZOFRAN) 4 MG tablet Take 1 tablet (4 mg total) by mouth every 8 (eight) hours as needed for nausea or vomiting. 10/05/21   [name] Lukes, MD  pantoprazole (PROTONIX) 40 MG tablet TAKE 1 TABLET BY MOUTH EVERY DAY 06/30/21   [name] Lukes, MD  pramipexole (MIRAPEX) 1 MG tablet Take 1/2 to 1 tablet by mouth at bedtime 01/07/22   [name] Lukes, MD  ranolazine (RANEXA) 500 MG 12 hr tablet TAKE 1 TABLET BY MOUTH TWICE DAILY 01/07/22 01/07/23  [name] Lukes, MD  sertraline  (ZOLOFT) 100 MG tablet TAKE 1 TABLET BY MOUTH TWICE DAILY 05/01/21 05/01/22  [name] Lukes, MD  sulfamethoxazole-trimethoprim (BACTRIM DS) 800-160 MG tablet Take 1 tablet by mouth 2 (two) times daily. 09/29/21   Kathrynn Ducking, MD  tiZANidine (ZANAFLEX) 4 MG tablet TAKE 1 TABLET BY MOUTH EVERY 6 HOURS AS NEEDED FOR MUSCLE SPASMS 04/29/21 04/29/22  [name] Lukes, MD  traMADol (ULTRAM) 50 MG tablet Take 1 tablet (50 mg total) by mouth every 6 (six) hours as needed. 09/29/21   Kathrynn Ducking, MD  triamcinolone cream (KENALOG) 0.1 % Apply 1 application topically 2 (two) times daily. Use as needed for itchy rash Patient taking differently: Apply 1 application topically 2 (two) times daily as needed (itchy rash). Use as needed for itchy rash 05/22/21   [name] Lukes, MD  triamcinolone cream (KENALOG) 0.5 % Apply 1 application on the skin twice a day 07/24/21     Vitamin D, Ergocalciferol, (DRISDOL) 1.25 MG (50000 UNIT) CAPS capsule TAKE 1 CAPSULE BY MOUTH EVERY 7 DAYS 12/08/21 12/08/22  [name] Lukes, MD    Inpatient Medications: Scheduled Meds:  apixaban  5 mg Oral BID   aspirin EC  81 mg Oral Daily   atorvastatin  80 mg Oral QHS   busPIRone  7.5 mg Oral BID   divalproex  500 mg Oral BID   famotidine  20 mg Oral BID   fenofibrate  160 mg Oral Daily   insulin aspart  0-6 Units Subcutaneous TID WC   insulin glargine-yfgn  20 Units Subcutaneous Daily   metoprolol succinate  75 mg Oral Daily   pantoprazole  40 mg Oral Daily   pramipexole  1 mg Oral QHS   ranolazine  500 mg Oral BID   Continuous Infusions:  PRN Meds: acetaminophen **OR** acetaminophen, dextrose, hydrocortisone cream, nitroGLYCERIN  Allergies:    Allergies  Allergen Reactions   Morphine Other (See Comments)    Hallucinations   Shellfish-Derived Products Itching    Social History:   Social History   Socioeconomic History   Marital status: Married    Spouse name: Not on file   Number of children: 4   Years of  education: 13   Highest education level: Not on file  Occupational History   Occupation: retired  Tobacco Use   Smoking status: Never    Passive exposure: Never   Smokeless tobacco: Never  Vaping Use   Vaping Use: Never used  Substance and Sexual Activity   Alcohol use: No    Alcohol/week: 0.0 standard drinks    Comment: stopped drinking in 1998   Drug use: No   Sexual activity: Not Currently    Partners: Female  Other Topics Concern   Not on file  Social History Narrative   Patient is right handed.   Patient does not drink caffeine.   Social Determinants of Health   Financial Resource Strain: Not on file  Food Insecurity: Not on file  Transportation Needs: Not on file  Physical Activity: Not on file  Stress: Not on file  Social Connections: Not on file  Intimate Partner Violence: Not on file    Family History:    Family History  Problem Relation Age of  Onset   Pancreatic cancer Brother    Diabetes Brother    Coronary artery disease Brother    Stroke Brother    Diabetes Brother    Diabetes Mother    Heart failure Mother    Heart failure Father    Hypothyroidism Brother    Coronary artery disease Brother    Other Brother        colon surgery   Heart attack Other        Nephew   Irregular heart beat Daughter    Cancer Maternal Grandmother        unknown    Colon cancer Neg Hx    Stomach cancer Neg Hx    Liver cancer Neg Hx    Rectal cancer Neg Hx    Esophageal cancer Neg Hx      ROS:  Please see the history of present illness.   All other ROS reviewed and negative.     Physical Exam/Data:   Vitals:   02/12/22 2300 02/13/22 0402 02/13/22 0700 02/13/22 0736  BP: 108/84 (!) 125/48 (!) 109/56 125/70  Pulse: 64 62 (!) 53 61  Resp: 17 15 (!) 23 16  Temp: 98.1 F (36.7 C) 97.9 F (36.6 C) 97.8 F (36.6 C) 97.8 F (36.6 C)  TempSrc: Oral Oral Oral Oral  SpO2: 99% 97% 96% 96%  Weight:      Height:        Intake/Output Summary (Last 24 hours) at  02/13/2022 0951 Last data filed at 02/13/2022 0700 Gross per 24 hour  Intake 360 ml  Output --  Net 360 ml   Last 3 Weights 02/12/2022 01/19/2022 11/02/2021  Weight (lbs) 190 lb 194 lb 188 lb 9.6 oz  Weight (kg) 86.183 kg 87.998 kg 85.548 kg     Body mass index is 28.06 kg/m.  General:  Well nourished, well developed, in no acute distress HEENT: Sclera anicteric, poor dentition. Neck: no JVD Vascular: No carotid bruits; Distal pulses 2+ bilaterally Cardiac:  normal S1, S2; RRR; no murmurs, rubs or gallops Lungs:  clear to auscultation bilaterally, no wheezing, rhonchi or rales  Abd: soft, nontender, no hepatomegaly  Ext: Trace LE edema Musculoskeletal:  No deformities, BUE and BLE strength normal and equal Skin: warm and dry  Neuro:  CNs 2-12 intact, no focal abnormalities noted Psych:  Normal affect   EKG:  The EKG was personally reviewed and demonstrates:  sinus rhythm, rate 71 bpm, chronic RBBB, chronic TWI in V2-3, no STE/D, overall unchanged from previous. Telemetry:  Telemetry was personally reviewed and demonstrates: Sinus rhythm with occasional PVCs  Relevant CV Studies: Echocardiogram 01/2020: 1. Left ventricular ejection fraction, by visual estimation, is 60 to  65%. The left ventricle has normal function. Mildly increased left  ventricular posterior wall thickness. There is mildly increased left  ventricular hypertrophy of the basal septum.   2. The left ventricle has no regional wall motion abnormalities.   3. Left ventricular diastolic parameters are indeterminate.   4. Elevated left ventricular end-diastolic pressure.   5. Global right ventricle has normal systolic function.The right  ventricular size is normal. No increase in right ventricular wall  thickness.   6. Left atrial size was normal.   7. Right atrial size was normal.   8. The mitral valve is normal in structure. Mild mitral valve  regurgitation. No evidence of mitral stenosis.   9. The tricuspid valve  is normal in structure. Tricuspid valve  regurgitation is not demonstrated.  10.  The aortic valve is normal in structure. Aortic valve regurgitation is  not visualized. No evidence of aortic valve sclerosis or stenosis.  11. The pulmonic valve was normal in structure. Pulmonic valve  regurgitation is trivial.  12. The inferior vena cava is normal in size with <50% respiratory  variability, suggesting right atrial pressure of 8 mmHg.   R/LHC 01/2020: Right heart cath demonstrated normal filling pressures and pulmonary artery pressure. Total occlusion of the mid LAD and diagonal.  Ramus intermedius is also totally occluded.  Mild to moderate disease is noted in the distal circumflex.  The native right coronary is totally occluded beyond the origin of the PDA. The bypass graft to the continuation of the right coronary is widely patent. The bypass graft to the diagonal is widely patent. The bypass graft to the ramus intermedius is widely patent. LIMA to the LAD is widely patent. Left ventricular function was not assessed with this study.  Left ventricular end-diastolic pressure was normal.   RECOMMENDATIONS:   In review of images, no significant change has occurred compared to 2019. If angina, consider microvascular origin.  Uncertain explanation for the patient's dyspnea.  Laboratory Data:  High Sensitivity Troponin:   Recent Labs  Lab 02/12/22 1450 02/12/22 1640 02/13/22 0049 02/13/22 0609  TROPONINIHS '4 4 8 7     '$ Chemistry Recent Labs  Lab 02/12/22 1450 02/13/22 0049  NA 139 139  K 3.7 3.7  CL 105 108  CO2 26 24  GLUCOSE 188* 165*  BUN 24* 19  CREATININE 1.61* 1.51*  CALCIUM 9.4 9.0  MG  --  2.0  GFRNONAA 45* 48*  ANIONGAP 8 7    Recent Labs  Lab 02/12/22 1457 02/13/22 0049  PROT 7.3 6.1*  ALBUMIN 4.3 3.5  AST 21 23  ALT 16 17  ALKPHOS 53 51  BILITOT 0.7 0.4   Lipids No results for input(s): CHOL, TRIG, HDL, LABVLDL, LDLCALC, CHOLHDL in the last 168 hours.   Hematology Recent Labs  Lab 02/12/22 1450 02/13/22 0049  WBC 9.0 7.9  RBC 4.15* 3.87*  HGB 12.2* 11.8*  HCT 36.5* 33.8*  MCV 88.0 87.3  MCH 29.4 30.5  MCHC 33.4 34.9  RDW 14.7 14.5  PLT 286 252   Thyroid No results for input(s): TSH, FREET4 in the last 168 hours.  BNPNo results for input(s): BNP, PROBNP in the last 168 hours.  DDimer No results for input(s): DDIMER in the last 168 hours.   Radiology/Studies:  DG Chest 2 View  Result Date: 02/12/2022 CLINICAL DATA:  chest pain EXAM: CHEST - 2 VIEW COMPARISON:  Chest x-ray 03/25/2021, CT chest 03/22/2016 FINDINGS: Cardiovascular surgical changes overlie the mediastinum. Query associated coronary artery stent. The heart and mediastinal contours are unchanged. No focal consolidation. No pulmonary edema. No pleural effusion. No pneumothorax. No acute osseous abnormality. IMPRESSION: No active cardiopulmonary disease. Electronically Signed   By: Iven Finn [name]   On: 02/12/2022 15:17   CT Head Wo Contrast  Result Date: 02/12/2022 CLINICAL DATA:  Dizziness, persistent/recurrent, cardiac or vascular cause suspected EXAM: CT HEAD WITHOUT CONTRAST TECHNIQUE: Contiguous axial images were obtained from the base of the skull through the vertex without intravenous contrast. RADIATION DOSE REDUCTION: This exam was performed according to the departmental dose-optimization program which includes automated exposure control, adjustment of the mA and/or kV according to patient size and/or use of iterative reconstruction technique. COMPARISON:  08/30/2021. FINDINGS: Brain: No evidence of acute large vascular territory infarction, hemorrhage, hydrocephalus, extra-axial collection or  mass lesion/mass effect. Similar cerebral atrophy and mild chronic microvascular ischemic disease. Vascular: No hyperdense vessel identified. Calcific intracranial atherosclerosis. Skull: No acute fracture. Sinuses/Orbits: Mild scattered paranasal sinus mucosal thickening  with secretions in the left frontal sinus. No acute orbital findings. Other: No mastoid effusions. IMPRESSION: 1. No evidence of acute intracranial abnormality. 2. Similar atrophy and chronic microvascular ischemic disease. Electronically Signed   By: Margaretha Sheffield [name]   On: 02/12/2022 19:12   US Venous Img Lower Unilateral Right (DVT)  Result Date: 02/12/2022 CLINICAL DATA:  Right lower leg swelling EXAM: RIGHT LOWER EXTREMITY VENOUS DOPPLER ULTRASOUND TECHNIQUE: Gray-scale sonography with compression, as well as color and duplex ultrasound, were performed to evaluate the deep venous system(s) from the level of the common femoral vein through the popliteal and proximal calf veins. COMPARISON:  07/19/2018. FINDINGS: VENOUS Normal compressibility of the common femoral, superficial femoral, and popliteal veins, as well as the visualized calf veins. Visualized portions of profunda femoral vein unremarkable. No filling defects to suggest DVT on grayscale or color Doppler imaging. Doppler waveforms show normal direction of venous flow, normal respiratory plasticity and response to augmentation. Limited views of the contralateral common femoral vein are unremarkable. OTHER None. Limitations: none IMPRESSION: Negative for DVT in the right lower extremity Electronically Signed   By: Merilyn Baba [name]   On: 02/12/2022 17:32     Assessment and Plan:   1.  Chest pain in patient with CAD s/p CABG in 2000 with subsequent PCI/DES to RCA in 2009: Patient presents with intermittent anginal complaints for the past several months, initially occurring once every 2 to 3 days and now occurring every 1 to 2 days.  Episodes occur with exertion and are relieved with 1-2 sublingual nitroglycerin and rest.  He had a similar episode 02/11/2021 and decided to present to [facility] ED for further evaluation.  EKG is nonischemic. HsTrop is negative x4.  His last ischemic evaluation was in [facility] 01/2020 which revealed patent grafts.   Echo at that time was also reassuring with EF 60-65%, no RWMA, indeterminate LV diastolic function, and mild MR. -Continue aspirin and statin -Continue amlodipine, Imdur, metoprolol, and Ranexa for antianginal effects -titration of medications is limited by soft blood pressures, mild bradycardia, and CKD -We will follow-up echocardiogram to evaluate LV function and wall motion which will determine if additional inpatient work-up is necessary -Could consider inpatient versus outpatient nuclear stress test  2.  Syncopal episodes: Unclear etiology given inconsistent occurrences.  He does have dizziness prior to "blacking out".  These do not occur at consistent times of the day or with consistent activities.  No palpitations or chest pain in the perisyncopal period.  Telemetry reassuring with occasional PVCs.  EKG does not show any significant changes from previous.  No carotid bruits on exam.  His last event monitor 07/2020 revealed paroxysmal atrial fibrillation with CVR.  Possible he is having recurrent episodes or possible AV block. -Consider outpatient cardiac monitor to further evaluate -Agree with outpatient neurology evaluation for gait instability and dizziness  3.  Paroxysmal atrial fibrillation: Appears to be maintaining sinus rhythm this admission.  Telemetry with sinus with occasional PVCs.  Unclear what role this plays in #2.  Recent syncope and falls certainly concerning with ongoing anticoagulation use. -We will continue Eliquis for stroke PPx.  Cautioned to utilize assistive devices with ambulation to minimize falls.  Encouraged ED evaluation for any head strikes. -Continue metoprolol for rate control  4. HTN: BP stable this admission -Continue  amlodipine and metoprolol  5.  HLD: LDL 46 06/2021 -Continue atorvastatin and fenofibrate  6.  DM type II: A1c 7.6 01/2022; goal <7.  On insulin at home -Continue management per primary team  7.  CKD stage IIIb: Creatinine stable at 1.5 this  morning; baseline ranges from 1.6-2.1 -Continue to monitor closely and limit nephrotoxic agents   Risk Assessment/Risk Scores:   HEAR Score (for undifferentiated chest pain):  HEAR Score: 6{     CHA2DS2-VASc Score = 5  This indicates a 7.2% annual risk of stroke. The patient's score is based upon: CHF History: 1 HTN History: 1 Diabetes History: 1 Stroke History: 0 Vascular Disease History: 1 Age Score: 1 Gender Score: 0        For questions or updates, please contact [facility] Please consult [email or web address] for contact info under    Signed, Abigail Butts, PA-C  02/13/2022 9:51 AM  Chest pain with typical and atypical features  Dyspnea on exertion  CAD with CABG 2000/PCI 2009  patent grafts 2021  Atrial fibrillation   Syncope recurrent presumably neurally mediated  Orthostatic hypotension doc 10/19 BP 142>>98  Seizure disorder  Grief  recent loss of wife   Multiple contributing issues,  recurrent atypical chest pain with cath last 2021 >> patent grafts; symptoms are now different with dyspnea on exertion and assoc with pallor LH and residual fatigue, which sound like a bezold-jarisch reflex possibly from inferior ischemia.  >> will do myoview  Need to check orthostatic VS, was very abnormal a few years ago but more recently were normal Has seen neuro fall 2022 for dizziness without specific diagnosis   No interval afib, continue Apixaban

## 2022-02-13 NOTE — Progress Notes (Signed)
PROGRESS NOTE    [name] [name]  BWG:[id number] DOB: 07/30/1946 DOA: 02/12/2022 PCP: [name], MD   Brief Narrative:  [name] [name] is a 76 y.o. male with medical history significant for coronary artery disease status post CABG in 2000, type 2 diabetes mellitus, generalized anxiety disorder, hypertension, GERD, hyperlipidemia, paroxysmal atrial fibrillation chronically anticoagulated on Eliquis, stage IIIb CKD with baseline creatinine 1.6-2.0, who is admitted to [facility] on 02/12/2022 by way of transfer from [facility] emergency department for further evaluation of chest pain.  Assessment & Plan:   Principal Problem:   Chest pain Active Problems:   RLS (restless legs syndrome)   GERD (gastroesophageal reflux disease)   Essential hypertension   HLD (hyperlipidemia)   PAF (paroxysmal atrial fibrillation) (HCC)   Seizure disorder (HCC)   Hypoglycemia   Anxiety   Atypical chest pain -Cardiology following, plan for Lexiscan per their schedule -Echo unchanged since prior -Continue current medication regimen pending clinical findings -Troponin and EKG reassuringly negative -Continue aspirin, statin, metoprolol, ranolazine  Paroxysmal Afib -rate controlled, continue eliquis HTN -continue current regimen including aspirin, metoprolol, ranolazine  CKD3B chronic, at baseline -continue to follow GERD -continue PPI HLD Continue fibrate Iron deficiency anemia, at baseline continue to follow Diabetes mellitus type 2, non-insulin-dependent, uncontrolled with hyperglycemia Continue insulin, hypoglycemic protocol Seizure disorder -continue home Depakote Anxiety, generalized currently well controlled on buspirone  DVT prophylaxis: Eliquis Code Status: Full Family Communication: None present  Status is: Observation  Dispo: The patient is from: Home              Anticipated d/c is to: Home              Anticipated d/c date is: 24 to 48 hours               Patient currently not medically stable for discharge  Consultants:  Cardiology  Procedures:  Lexiscan pending  Antimicrobials:  None  Subjective: No acute issues or events overnight denies nausea vomiting diarrhea constipation headache fevers chills or chest pain today, previous symptoms have resolved at this point  Objective: Vitals:   02/12/22 1905 02/12/22 2111 02/12/22 2300 02/13/22 0402  BP: 113/74 (!) 129/56 108/84 (!) 125/48  Pulse: (!) 55 62 64 62  Resp: '16 14 17 15  '$ Temp: (!) 97.5 F (36.4 C) 97.8 F (36.6 C) 98.1 F (36.7 C) 97.9 F (36.6 C)  TempSrc: Oral Oral Oral Oral  SpO2: 97% 98% 99% 97%  Weight:      Height:       No intake or output data in the 24 hours ending 02/13/22 0713 Filed Weights   02/12/22 1438  Weight: 86.2 kg    Examination:  General exam: Appears calm and comfortable  Respiratory system: Clear to auscultation. Respiratory effort normal. Cardiovascular system: S1 & S2 heard, RRR. No JVD, murmurs, rubs, gallops or clicks. No pedal edema. Gastrointestinal system: Abdomen is nondistended, soft and nontender. No organomegaly or masses felt. Normal bowel sounds heard. Central nervous system: Alert and oriented. No focal neurological deficits. Extremities: Symmetric 5 x 5 power. Skin: No rashes, lesions or ulcers Psychiatry: Judgement and insight appear normal. Mood & affect appropriate.     Data Reviewed: I have personally reviewed following labs and imaging studies  CBC: Recent Labs  Lab 02/12/22 1450 02/13/22 0049  WBC 9.0 7.9  NEUTROABS  --  4.9  HGB 12.2* 11.8*  HCT 36.5* 33.8*  MCV 88.0 87.3  PLT 286 252  Basic Metabolic Panel: Recent Labs  Lab 02/12/22 1450 02/13/22 0049  NA 139 139  K 3.7 3.7  CL 105 108  CO2 26 24  GLUCOSE 188* 165*  BUN 24* 19  CREATININE 1.61* 1.51*  CALCIUM 9.4 9.0  MG  --  2.0   GFR: Estimated Creatinine Clearance: 47.4 mL/min (A) (by C-G formula based on SCr of 1.51 mg/dL (H)). Liver  Function Tests: Recent Labs  Lab 02/12/22 1457 02/13/22 0049  AST 21 23  ALT 16 17  ALKPHOS 53 51  BILITOT 0.7 0.4  PROT 7.3 6.1*  ALBUMIN 4.3 3.5   Recent Labs  Lab 02/13/22 0049  LIPASE 25   No results for input(s): AMMONIA in the last 168 hours. Coagulation Profile: No results for input(s): INR, PROTIME in the last 168 hours. Cardiac Enzymes: No results for input(s): CKTOTAL, CKMB, CKMBINDEX, TROPONINI in the last 168 hours. BNP (last 3 results) No results for input(s): PROBNP in the last 8760 hours. HbA1C: No results for input(s): HGBA1C in the last 72 hours. CBG: Recent Labs  Lab 02/12/22 2011 02/12/22 2106 02/13/22 0649  GLUCAP 43* 108* 121*   Lipid Profile: No results for input(s): CHOL, HDL, LDLCALC, TRIG, CHOLHDL, LDLDIRECT in the last 72 hours. Thyroid Function Tests: No results for input(s): TSH, T4TOTAL, FREET4, T3FREE, THYROIDAB in the last 72 hours. Anemia Panel: No results for input(s): VITAMINB12, FOLATE, FERRITIN, TIBC, IRON, RETICCTPCT in the last 72 hours. Sepsis Labs: No results for input(s): PROCALCITON, LATICACIDVEN in the last 168 hours.  Recent Results (from the past 240 hour(s))  Resp Panel by RT-PCR (Flu A&B, Covid) Nasopharyngeal Swab     Status: None   Collection Time: 02/12/22  4:04 PM   Specimen: Nasopharyngeal Swab; Nasopharyngeal(NP) swabs in vial transport medium  Result Value Ref Range Status   SARS Coronavirus 2 by RT PCR NEGATIVE NEGATIVE Final    Comment: (NOTE) SARS-CoV-2 target nucleic acids are NOT DETECTED.  The SARS-CoV-2 RNA is generally detectable in upper respiratory specimens during the acute phase of infection. The lowest concentration of SARS-CoV-2 viral copies this assay can detect is 138 copies/mL. A negative result does not preclude SARS-Cov-2 infection and should not be used as the sole basis for treatment or other patient management decisions. A negative result may occur with  improper specimen  collection/handling, submission of specimen other than nasopharyngeal swab, presence of viral mutation(s) within the areas targeted by this assay, and inadequate number of viral copies(<138 copies/mL). A negative result must be combined with clinical observations, patient history, and epidemiological information. The expected result is Negative.  Fact Sheet for Patients:  EntrepreneurPulse.com.au  Fact Sheet for Healthcare Providers:  IncredibleEmployment.be  This test is no t yet approved or cleared by the [place] FDA and  has been authorized for detection and/or diagnosis of SARS-CoV-2 by FDA under an Emergency Use Authorization (EUA). This EUA will remain  in effect (meaning this test can be used) for the duration of the COVID-19 declaration under Section 564(b)(1) of the Act, 21 U.S.C.section 360bbb-3(b)(1), unless the authorization is terminated  or revoked sooner.       Influenza A by PCR NEGATIVE NEGATIVE Final   Influenza B by PCR NEGATIVE NEGATIVE Final    Comment: (NOTE) The Xpert Xpress SARS-CoV-2/FLU/RSV plus assay is intended as an aid in the diagnosis of influenza from Nasopharyngeal swab specimens and should not be used as a sole basis for treatment. Nasal washings and aspirates are unacceptable for Xpert Xpress SARS-CoV-2/FLU/RSV testing.  Fact Sheet for Patients: EntrepreneurPulse.com.au  Fact Sheet for Healthcare Providers: IncredibleEmployment.be  This test is not yet approved or cleared by the [place] FDA and has been authorized for detection and/or diagnosis of SARS-CoV-2 by FDA under an Emergency Use Authorization (EUA). This EUA will remain in effect (meaning this test can be used) for the duration of the COVID-19 declaration under Section 564(b)(1) of the Act, 21 U.S.C. section 360bbb-3(b)(1), unless the authorization is terminated or revoked.  Performed at [facility], [address] [place], [place] [address]   MRSA Next Gen by PCR, Nasal     Status: None   Collection Time: 02/12/22  9:13 PM   Specimen: Nasal Mucosa; Nasal Swab  Result Value Ref Range Status   MRSA by PCR Next Gen NOT DETECTED NOT DETECTED Final    Comment: (NOTE) The GeneXpert MRSA Assay (FDA approved for NASAL specimens only), is one component of a comprehensive MRSA colonization surveillance program. It is not intended to diagnose MRSA infection nor to guide or monitor treatment for MRSA infections. Test performance is not FDA approved in patients less than 68 years old. Performed at [facility], [place] [address] [place], [place] [address]          Radiology Studies: DG Chest 2 View  Result Date: 02/12/2022 CLINICAL DATA:  chest pain EXAM: CHEST - 2 VIEW COMPARISON:  Chest x-ray 03/25/2021, CT chest 03/22/2016 FINDINGS: Cardiovascular surgical changes overlie the mediastinum. Query associated coronary artery stent. The heart and mediastinal contours are unchanged. No focal consolidation. No pulmonary edema. No pleural effusion. No pneumothorax. No acute osseous abnormality. IMPRESSION: No active cardiopulmonary disease. Electronically Signed   By: Iven Finn [name]   On: 02/12/2022 15:17   CT Head Wo Contrast  Result Date: 02/12/2022 CLINICAL DATA:  Dizziness, persistent/recurrent, cardiac or vascular cause suspected EXAM: CT HEAD WITHOUT CONTRAST TECHNIQUE: Contiguous axial images were obtained from the base of the skull through the vertex without intravenous contrast. RADIATION DOSE REDUCTION: This exam was performed according to the departmental dose-optimization program which includes automated exposure control, adjustment of the mA and/or kV according to patient size and/or use of iterative reconstruction technique. COMPARISON:  08/30/2021. FINDINGS: Brain: No evidence of acute large vascular territory infarction, hemorrhage, hydrocephalus,  extra-axial collection or mass lesion/mass effect. Similar cerebral atrophy and mild chronic microvascular ischemic disease. Vascular: No hyperdense vessel identified. Calcific intracranial atherosclerosis. Skull: No acute fracture. Sinuses/Orbits: Mild scattered paranasal sinus mucosal thickening with secretions in the left frontal sinus. No acute orbital findings. Other: No mastoid effusions. IMPRESSION: 1. No evidence of acute intracranial abnormality. 2. Similar atrophy and chronic microvascular ischemic disease. Electronically Signed   By: Margaretha Sheffield [name]   On: 02/12/2022 19:12   US Venous Img Lower Unilateral Right (DVT)  Result Date: 02/12/2022 CLINICAL DATA:  Right lower leg swelling EXAM: RIGHT LOWER EXTREMITY VENOUS DOPPLER ULTRASOUND TECHNIQUE: Gray-scale sonography with compression, as well as color and duplex ultrasound, were performed to evaluate the deep venous system(s) from the level of the common femoral vein through the popliteal and proximal calf veins. COMPARISON:  07/19/2018. FINDINGS: VENOUS Normal compressibility of the common femoral, superficial femoral, and popliteal veins, as well as the visualized calf veins. Visualized portions of profunda femoral vein unremarkable. No filling defects to suggest DVT on grayscale or color Doppler imaging. Doppler waveforms show normal direction of venous flow, normal respiratory plasticity and response to augmentation. Limited views of the contralateral common femoral vein are unremarkable.  OTHER None. Limitations: none IMPRESSION: Negative for DVT in the right lower extremity Electronically Signed   By: Merilyn Baba [name]   On: 02/12/2022 17:32    Scheduled Meds:  apixaban  5 mg Oral BID   aspirin EC  81 mg Oral Daily   atorvastatin  80 mg Oral QHS   busPIRone  7.5 mg Oral BID   divalproex  500 mg Oral BID   famotidine  20 mg Oral BID   fenofibrate  160 mg Oral Daily   insulin aspart  0-6 Units Subcutaneous TID WC   insulin  glargine-yfgn  20 Units Subcutaneous Daily   metoprolol succinate  75 mg Oral Daily   pantoprazole  40 mg Oral Daily   pramipexole  1 mg Oral QHS   ranolazine  500 mg Oral BID   Continuous Infusions:   LOS: 1 day   Time spent: 59mn  Woodie Degraffenreid C Jhada Risk, DO [facility]  If 7PM-7AM, please contact night-coverage [email or web address]  02/13/2022, 7:13 AM

## 2022-02-13 NOTE — Progress Notes (Signed)
Received pt from [place] via [place] to 2C06.  Pt alert and oriented, pain free.  Pt admitted to room and oriented to plan of care.  Dr. Velia Meyer notified of pt arrival.  VSS.  Will continue to monitor patient. ?

## 2022-02-13 NOTE — Plan of Care (Signed)
?  Problem: Education: Goal: Understanding of cardiac disease, CV risk reduction, and recovery process will improve Outcome: Progressing   Problem: Activity: Goal: Ability to tolerate increased activity will improve Outcome: Progressing   Problem: Cardiac: Goal: Ability to achieve and maintain adequate cardiovascular perfusion will improve Outcome: Progressing   

## 2022-02-14 ENCOUNTER — Observation Stay (HOSPITAL_COMMUNITY): Payer: Medicare Other

## 2022-02-14 DIAGNOSIS — I208 Other forms of angina pectoris: Secondary | ICD-10-CM | POA: Diagnosis not present

## 2022-02-14 DIAGNOSIS — E162 Hypoglycemia, unspecified: Secondary | ICD-10-CM | POA: Diagnosis not present

## 2022-02-14 DIAGNOSIS — F419 Anxiety disorder, unspecified: Secondary | ICD-10-CM | POA: Diagnosis not present

## 2022-02-14 DIAGNOSIS — I480 Paroxysmal atrial fibrillation: Secondary | ICD-10-CM | POA: Diagnosis not present

## 2022-02-14 DIAGNOSIS — I10 Essential (primary) hypertension: Secondary | ICD-10-CM | POA: Diagnosis not present

## 2022-02-14 DIAGNOSIS — G2581 Restless legs syndrome: Secondary | ICD-10-CM | POA: Diagnosis not present

## 2022-02-14 DIAGNOSIS — E119 Type 2 diabetes mellitus without complications: Secondary | ICD-10-CM | POA: Diagnosis not present

## 2022-02-14 DIAGNOSIS — E782 Mixed hyperlipidemia: Secondary | ICD-10-CM | POA: Diagnosis not present

## 2022-02-14 DIAGNOSIS — K219 Gastro-esophageal reflux disease without esophagitis: Secondary | ICD-10-CM | POA: Diagnosis not present

## 2022-02-14 DIAGNOSIS — R079 Chest pain, unspecified: Secondary | ICD-10-CM | POA: Diagnosis not present

## 2022-02-14 LAB — CBC
HCT: 40 % (ref 39.0–52.0)
Hemoglobin: 13.6 g/dL (ref 13.0–17.0)
MCH: 30 pg (ref 26.0–34.0)
MCHC: 34 g/dL (ref 30.0–36.0)
MCV: 88.1 fL (ref 80.0–100.0)
Platelets: 272 10*3/uL (ref 150–400)
RBC: 4.54 MIL/uL (ref 4.22–5.81)
RDW: 14.5 % (ref 11.5–15.5)
WBC: 7.9 10*3/uL (ref 4.0–10.5)
nRBC: 0 % (ref 0.0–0.2)

## 2022-02-14 LAB — BASIC METABOLIC PANEL
Anion gap: 6 (ref 5–15)
BUN: 14 mg/dL (ref 8–23)
CO2: 25 mmol/L (ref 22–32)
Calcium: 9.5 mg/dL (ref 8.9–10.3)
Chloride: 109 mmol/L (ref 98–111)
Creatinine, Ser: 1.55 mg/dL — ABNORMAL HIGH (ref 0.61–1.24)
GFR, Estimated: 47 mL/min — ABNORMAL LOW (ref >60)
Glucose, Bld: 46 mg/dL — ABNORMAL LOW (ref 70–99)
Potassium: 3.8 mmol/L (ref 3.5–5.1)
Sodium: 140 mmol/L (ref 135–145)

## 2022-02-14 LAB — NM MYOCAR MULTI W/SPECT W/WALL MOTION / EF
Estimated workload: 1
Exercise duration (min): 0 min
Exercise duration (sec): 0 s
MPHR: 147 {beats}/min
Peak HR: 80 {beats}/min
Percent HR: 54 %
Rest HR: 49 {beats}/min
ST Depression (mm): 0 mm

## 2022-02-14 LAB — GLUCOSE, CAPILLARY
Glucose-Capillary: 57 mg/dL — ABNORMAL LOW (ref 70–99)
Glucose-Capillary: 78 mg/dL (ref 70–99)
Glucose-Capillary: 246 mg/dL — ABNORMAL HIGH (ref 70–99)

## 2022-02-14 MED ORDER — REGADENOSON 0.4 MG/5ML IV SOLN
INTRAVENOUS | Status: AC
Start: 2022-02-14 — End: 2022-02-14
  Administered 2022-02-14: 0.4 mg via INTRAVENOUS
  Filled 2022-02-14: qty 5

## 2022-02-14 MED ORDER — REGADENOSON 0.4 MG/5ML IV SOLN
0.4000 mg | Freq: Once | INTRAVENOUS | Status: AC
  Filled 2022-02-14: qty 5

## 2022-02-14 MED ORDER — TECHNETIUM TC 99M TETROFOSMIN IV KIT
9.9000 | PACK | Freq: Once | INTRAVENOUS | Status: AC | PRN
  Administered 2022-02-14: 9.9 via INTRAVENOUS

## 2022-02-14 MED ORDER — METOPROLOL SUCCINATE ER 25 MG PO TB24: 25.0000 mg | ORAL_TABLET | Freq: Every day | ORAL | Status: DC

## 2022-02-14 MED ORDER — FENOFIBRATE 160 MG PO TABS: 160.0000 mg | ORAL_TABLET | Freq: Every day | ORAL | 0 refills | Status: DC

## 2022-02-14 MED ORDER — TOUJEO MAX SOLOSTAR 300 UNIT/ML ~~LOC~~ SOPN: 20.0000 [IU] | PEN_INJECTOR | SUBCUTANEOUS | 0 refills | Status: DC

## 2022-02-14 MED ORDER — TECHNETIUM TC 99M TETROFOSMIN IV KIT
32.7000 | PACK | Freq: Once | INTRAVENOUS | Status: AC | PRN
  Administered 2022-02-14: 32.7 via INTRAVENOUS

## 2022-02-14 MED ORDER — POTASSIUM CHLORIDE CRYS ER 20 MEQ PO TBCR
20.0000 meq | EXTENDED_RELEASE_TABLET | Freq: Once | ORAL | Status: AC
  Administered 2022-02-14: 20 meq via ORAL
  Filled 2022-02-14: qty 1

## 2022-02-14 NOTE — Progress Notes (Incomplete Revision)
Progress Note  Patient Name: [name] [name] Date of Encounter: 02/14/2022  [facility] Cardiologist: Larae Grooms, MD   Subjective   Breathing at baseline. No recurrent chest pain overnight or this morning. Has been NPO since midnight for stress test today.   Inpatient Medications    Scheduled Meds:  apixaban  5 mg Oral BID   aspirin EC  81 mg Oral Daily   atorvastatin  80 mg Oral QHS   divalproex  500 mg Oral BID   famotidine  20 mg Oral BID   fenofibrate  160 mg Oral Daily   insulin aspart  0-6 Units Subcutaneous TID WC   insulin glargine-yfgn  20 Units Subcutaneous Daily   metoprolol succinate  75 mg Oral Daily   pantoprazole  40 mg Oral Daily   pramipexole  1 mg Oral QHS   ranolazine  500 mg Oral BID   Continuous Infusions:  PRN Meds: acetaminophen **OR** acetaminophen, dextrose, hydrocortisone cream, melatonin, nitroGLYCERIN   Vital Signs    Vitals:   02/13/22 1549 02/13/22 1916 02/14/22 0431 02/14/22 0745  BP: (!) 120/51 (!) 148/67 120/61 110/60  Pulse: (!) 56 (!) 55 62 (!) 53  Resp: '15 14 16 17  '$ Temp: 97.8 F (36.6 C) 97.8 F (36.6 C) (!) 97.5 F (36.4 C) 97.7 F (36.5 C)  TempSrc: Oral Oral Oral Oral  SpO2: 98% 98% 95% 100%  Weight:   83.5 kg   Height:       No intake or output data in the 24 hours ending 02/14/22 0850 Last 3 Weights 02/14/2022 02/12/2022 01/19/2022  Weight (lbs) 184 lb 1.4 oz 190 lb 194 lb  Weight (kg) 83.5 kg 86.183 kg 87.998 kg      Telemetry    Sinus bradycardia, HR in 40's to 50's. Occasional nocturnal pauses with the longest being 3.2 seconds.  - Personally Reviewed  ECG    NSR, HR 71 with 1st degree AV Block and known RBBB.  - Personally Reviewed  Physical Exam   GEN: Pleasant male appearing in no acute distress.   Neck: No JVD Cardiac: Regular rhythm with bradycardiac rate, no murmurs, rubs, or gallops.  Respiratory: Clear to auscultation bilaterally without wheezing or rales. GI: Soft, nontender,  non-distended  MS: No pitting edema; No deformity. Neuro:  Nonfocal  Psych: Normal affect   Labs    High Sensitivity Troponin:   Recent Labs  Lab 02/12/22 1450 02/12/22 1640 02/13/22 0049 02/13/22 0609  TROPONINIHS '4 4 8 7     '$ Chemistry Recent Labs  Lab 02/12/22 1450 02/12/22 1457 02/13/22 0049 02/14/22 0004  NA 139  --  139 140  K 3.7  --  3.7 3.8  CL 105  --  108 109  CO2 26  --  24 25  GLUCOSE 188*  --  165* 46*  BUN 24*  --  19 14  CREATININE 1.61*  --  1.51* 1.55*  CALCIUM 9.4  --  9.0 9.5  MG  --   --  2.0  --   PROT  --  7.3 6.1*  --   ALBUMIN  --  4.3 3.5  --   AST  --  21 23  --   ALT  --  16 17  --   ALKPHOS  --  53 51  --   BILITOT  --  0.7 0.4  --   GFRNONAA 45*  --  48* 47*  ANIONGAP 8  --  7 6  Lipids No results for input(s): CHOL, TRIG, HDL, LABVLDL, LDLCALC, CHOLHDL in the last 168 hours.  Hematology Recent Labs  Lab 02/12/22 1450 02/13/22 0049 02/14/22 0004  WBC 9.0 7.9 7.9  RBC 4.15* 3.87* 4.54  HGB 12.2* 11.8* 13.6  HCT 36.5* 33.8* 40.0  MCV 88.0 87.3 88.1  MCH 29.4 30.5 30.0  MCHC 33.4 34.9 34.0  RDW 14.7 14.5 14.5  PLT 286 252 272   Thyroid No results for input(s): TSH, FREET4 in the last 168 hours.  BNPNo results for input(s): BNP, PROBNP in the last 168 hours.  DDimer No results for input(s): DDIMER in the last 168 hours.   Radiology    DG Chest 2 View  Result Date: 02/12/2022 CLINICAL DATA:  chest pain EXAM: CHEST - 2 VIEW COMPARISON:  Chest x-ray 03/25/2021, CT chest 03/22/2016 FINDINGS: Cardiovascular surgical changes overlie the mediastinum. Query associated coronary artery stent. The heart and mediastinal contours are unchanged. No focal consolidation. No pulmonary edema. No pleural effusion. No pneumothorax. No acute osseous abnormality. IMPRESSION: No active cardiopulmonary disease. Electronically Signed   By: Iven Finn [name]   On: 02/12/2022 15:17   CT Head Wo Contrast  Result Date: 02/12/2022 CLINICAL DATA:   Dizziness, persistent/recurrent, cardiac or vascular cause suspected EXAM: CT HEAD WITHOUT CONTRAST TECHNIQUE: Contiguous axial images were obtained from the base of the skull through the vertex without intravenous contrast. RADIATION DOSE REDUCTION: This exam was performed according to the departmental dose-optimization program which includes automated exposure control, adjustment of the mA and/or kV according to patient size and/or use of iterative reconstruction technique. COMPARISON:  08/30/2021. FINDINGS: Brain: No evidence of acute large vascular territory infarction, hemorrhage, hydrocephalus, extra-axial collection or mass lesion/mass effect. Similar cerebral atrophy and mild chronic microvascular ischemic disease. Vascular: No hyperdense vessel identified. Calcific intracranial atherosclerosis. Skull: No acute fracture. Sinuses/Orbits: Mild scattered paranasal sinus mucosal thickening with secretions in the left frontal sinus. No acute orbital findings. Other: No mastoid effusions. IMPRESSION: 1. No evidence of acute intracranial abnormality. 2. Similar atrophy and chronic microvascular ischemic disease. Electronically Signed   By: Margaretha Sheffield [name]   On: 02/12/2022 19:12   US Venous Img Lower Unilateral Right (DVT)  Result Date: 02/12/2022 CLINICAL DATA:  Right lower leg swelling EXAM: RIGHT LOWER EXTREMITY VENOUS DOPPLER ULTRASOUND TECHNIQUE: Gray-scale sonography with compression, as well as color and duplex ultrasound, were performed to evaluate the deep venous system(s) from the level of the common femoral vein through the popliteal and proximal calf veins. COMPARISON:  07/19/2018. FINDINGS: VENOUS Normal compressibility of the common femoral, superficial femoral, and popliteal veins, as well as the visualized calf veins. Visualized portions of profunda femoral vein unremarkable. No filling defects to suggest DVT on grayscale or color Doppler imaging. Doppler waveforms show normal direction of  venous flow, normal respiratory plasticity and response to augmentation. Limited views of the contralateral common femoral vein are unremarkable. OTHER None. Limitations: none IMPRESSION: Negative for DVT in the right lower extremity Electronically Signed   By: Merilyn Baba [name]   On: 02/12/2022 17:32    Cardiac Studies   Echocardiogram: 02/13/2022 IMPRESSIONS     1. Left ventricular ejection fraction, by estimation, is 60 to 65%. The  left ventricle has normal function. The left ventricle has no regional  wall motion abnormalities. Left ventricular diastolic parameters were  normal. The average left ventricular  global longitudinal strain is -19.9 %. The global longitudinal strain is  normal.   2. Right ventricular systolic function is  normal. The right ventricular  size is normal.   3. Left atrial size was mildly dilated.   4. The mitral valve is normal in structure. Trivial mitral valve  regurgitation. No evidence of mitral stenosis.   5. The aortic valve is tricuspid. Aortic valve regurgitation is not  visualized. No aortic stenosis is present.   6. The inferior vena cava is normal in size with greater than 50%  respiratory variability, suggesting right atrial pressure of 3 mmHg.   Comparison(s): No significant change from prior study.   Patient Profile     76 y.o. male w/ PMH of CAD (s/p CABG in 2000 with subsequent PCI/DES to RCA in 2009, caths in 12/2017 and 01/2020 showing patent grafts), HTN, HLD, paroxysmal atrial fibrillation, PSVT, DM type II, CKD stage III, anxiety, iron deficiency anemia and GERD who is currently admitted for evaluation of chest pain.   Assessment & Plan    1.  Chest pain concerning for Angina/ Known CAD - He is s/p CABG in 2000 with subsequent PCI/DES to RCA in 2009 and recent catheterizations in 2019 and 2021 showed patent grafts.  - Presented with chest pain occurring with activity and relieved with rest or NTG. Hs Troponin values have been  negative with EKG showing no acute ST changes.  - Echo this admission shows a preserved EF of 60-65% with no regional WMA and no significant valve abnormalities. Planning for [facility] today to rule-out ischemia. If his stress test is reassuring, it is possible bradycardia could be playing a role with associated chronotropic incompetence as he was on Toprol-XL '75mg'$  daily prior to admission with HR in the 40's to 50's today despite this being held yesterday and he reports similar readings at home. Given HR in the 40's, will stop Toprol-XL for now. Continue ASA, Atorvastatin and Ranexa. PTA Imdur currently held due to soft BP.   2. Syncopal episodes/Bradycardia - Reports variable episodes of syncope at home with no clear etiology and no symptoms prior to the events. On telemetry, he has been bradycardic with HR in the 40's to 50's despite Toprol-XL being held yesterday. Holding now given HR in the 40's but will need to monitor for rebound tachycardia. Orthostatics negative yesterday. No evidence of high-grade AV block but did have nocturnal pauses up to 3.2 seconds overnight and would benefit from a sleep  study as an outpatient. Can also consider an outpatient monitor for further assessment of his HR.   3. Paroxysmal atrial fibrillation - He has a history of paroxysmal atrial fibrillation but overall low-burden at 1% by his monitor in 2021 and HR averaged in the 90's while in atrial fibrillation. Given his bradycardia while in sinus rhythm, will hold his BB as discussed above. Continue Eliquis '5mg'$  BID for anticoagulation.   4. HTN - BP has been stable, at 110/60 on most recent check. Holding Amlodipine, Imdur and Metoprolol for now.   5. HLD - LDL at 46 in 06/2021. Remains on Atorvastatin and Fenofibrate.    6. Type 2 DM - A1c 7.6 01/2022. On insulin at home. Management per the admitting team.   7. Stage 3 CKD - Creatinine at 1.55 this AM which is close to his baseline.   For questions or  updates, please contact [place] Please consult [email or web address] for contact info under        Signed, Erma Heritage, PA-C  02/14/2022, 8:50 AM    Chest pain with typical and atypical features   Dyspnea on exertion  CAD with CABG 2000/PCI 2009  patent grafts 2021   Atrial fibrillation paroxysmal  Sinus bradycardia    Syncope recurrent presumably neurally mediated   Orthostatic hypotension doc 10/19 BP 142>>98   Seizure disorder   Grief  recent loss of wife     Myoview results pending  Agree with BS PA- to decrease BB  75>>25 metop AFib rates ok If myoview non ischemic can be discharged today

## 2022-02-14 NOTE — Progress Notes (Signed)
? ?  [name] [name] presented for a nuclear stress test today.  No immediate complications.  Stress imaging is pending at this time. ? ?Preliminary EKG findings may be listed in the chart, but the stress test result will not be finalized until perfusion imaging is complete. ? ?One day study, [facility] radiology to read. ? ?Abigail Butts, PA-C ?02/14/2022, 10:59 AM  ? ?

## 2022-02-14 NOTE — Progress Notes (Cosign Needed)
Progress Note  Patient Name: [name] [name] Date of Encounter: 02/14/2022  [facility] Cardiologist: Larae Grooms, MD   Subjective   Breathing at baseline. No recurrent chest pain overnight or this morning. Has been NPO since midnight for stress test today.   Inpatient Medications    Scheduled Meds:  apixaban  5 mg Oral BID   aspirin EC  81 mg Oral Daily   atorvastatin  80 mg Oral QHS   divalproex  500 mg Oral BID   famotidine  20 mg Oral BID   fenofibrate  160 mg Oral Daily   insulin aspart  0-6 Units Subcutaneous TID WC   insulin glargine-yfgn  20 Units Subcutaneous Daily   metoprolol succinate  75 mg Oral Daily   pantoprazole  40 mg Oral Daily   pramipexole  1 mg Oral QHS   ranolazine  500 mg Oral BID   Continuous Infusions:  PRN Meds: acetaminophen **OR** acetaminophen, dextrose, hydrocortisone cream, melatonin, nitroGLYCERIN   Vital Signs    Vitals:   02/13/22 1549 02/13/22 1916 02/14/22 0431 02/14/22 0745  BP: (!) 120/51 (!) 148/67 120/61 110/60  Pulse: (!) 56 (!) 55 62 (!) 53  Resp: '15 14 16 17  '$ Temp: 97.8 F (36.6 C) 97.8 F (36.6 C) (!) 97.5 F (36.4 C) 97.7 F (36.5 C)  TempSrc: Oral Oral Oral Oral  SpO2: 98% 98% 95% 100%  Weight:   83.5 kg   Height:       No intake or output data in the 24 hours ending 02/14/22 0850 Last 3 Weights 02/14/2022 02/12/2022 01/19/2022  Weight (lbs) 184 lb 1.4 oz 190 lb 194 lb  Weight (kg) 83.5 kg 86.183 kg 87.998 kg      Telemetry    Sinus bradycardia, HR in 40's to 50's. Occasional nocturnal pauses with the longest being 3.2 seconds.  - Personally Reviewed  ECG    NSR, HR 71 with 1st degree AV Block and known RBBB.  - Personally Reviewed  Physical Exam   GEN: Pleasant male appearing in no acute distress.   Neck: No JVD Cardiac: Regular rhythm with bradycardiac rate, no murmurs, rubs, or gallops.  Respiratory: Clear to auscultation bilaterally without wheezing or rales. GI: Soft, nontender,  non-distended  MS: No pitting edema; No deformity. Neuro:  Nonfocal  Psych: Normal affect   Labs    High Sensitivity Troponin:   Recent Labs  Lab 02/12/22 1450 02/12/22 1640 02/13/22 0049 02/13/22 0609  TROPONINIHS '4 4 8 7     '$ Chemistry Recent Labs  Lab 02/12/22 1450 02/12/22 1457 02/13/22 0049 02/14/22 0004  NA 139  --  139 140  K 3.7  --  3.7 3.8  CL 105  --  108 109  CO2 26  --  24 25  GLUCOSE 188*  --  165* 46*  BUN 24*  --  19 14  CREATININE 1.61*  --  1.51* 1.55*  CALCIUM 9.4  --  9.0 9.5  MG  --   --  2.0  --   PROT  --  7.3 6.1*  --   ALBUMIN  --  4.3 3.5  --   AST  --  21 23  --   ALT  --  16 17  --   ALKPHOS  --  53 51  --   BILITOT  --  0.7 0.4  --   GFRNONAA 45*  --  48* 47*  ANIONGAP 8  --  7 6  Lipids No results for input(s): CHOL, TRIG, HDL, LABVLDL, LDLCALC, CHOLHDL in the last 168 hours.  Hematology Recent Labs  Lab 02/12/22 1450 02/13/22 0049 02/14/22 0004  WBC 9.0 7.9 7.9  RBC 4.15* 3.87* 4.54  HGB 12.2* 11.8* 13.6  HCT 36.5* 33.8* 40.0  MCV 88.0 87.3 88.1  MCH 29.4 30.5 30.0  MCHC 33.4 34.9 34.0  RDW 14.7 14.5 14.5  PLT 286 252 272   Thyroid No results for input(s): TSH, FREET4 in the last 168 hours.  BNPNo results for input(s): BNP, PROBNP in the last 168 hours.  DDimer No results for input(s): DDIMER in the last 168 hours.   Radiology    DG Chest 2 View  Result Date: 02/12/2022 CLINICAL DATA:  chest pain EXAM: CHEST - 2 VIEW COMPARISON:  Chest x-ray 03/25/2021, CT chest 03/22/2016 FINDINGS: Cardiovascular surgical changes overlie the mediastinum. Query associated coronary artery stent. The heart and mediastinal contours are unchanged. No focal consolidation. No pulmonary edema. No pleural effusion. No pneumothorax. No acute osseous abnormality. IMPRESSION: No active cardiopulmonary disease. Electronically Signed   By: Iven Finn [name]   On: 02/12/2022 15:17   CT Head Wo Contrast  Result Date: 02/12/2022 CLINICAL DATA:   Dizziness, persistent/recurrent, cardiac or vascular cause suspected EXAM: CT HEAD WITHOUT CONTRAST TECHNIQUE: Contiguous axial images were obtained from the base of the skull through the vertex without intravenous contrast. RADIATION DOSE REDUCTION: This exam was performed according to the departmental dose-optimization program which includes automated exposure control, adjustment of the mA and/or kV according to patient size and/or use of iterative reconstruction technique. COMPARISON:  08/30/2021. FINDINGS: Brain: No evidence of acute large vascular territory infarction, hemorrhage, hydrocephalus, extra-axial collection or mass lesion/mass effect. Similar cerebral atrophy and mild chronic microvascular ischemic disease. Vascular: No hyperdense vessel identified. Calcific intracranial atherosclerosis. Skull: No acute fracture. Sinuses/Orbits: Mild scattered paranasal sinus mucosal thickening with secretions in the left frontal sinus. No acute orbital findings. Other: No mastoid effusions. IMPRESSION: 1. No evidence of acute intracranial abnormality. 2. Similar atrophy and chronic microvascular ischemic disease. Electronically Signed   By: Margaretha Sheffield [name]   On: 02/12/2022 19:12   US Venous Img Lower Unilateral Right (DVT)  Result Date: 02/12/2022 CLINICAL DATA:  Right lower leg swelling EXAM: RIGHT LOWER EXTREMITY VENOUS DOPPLER ULTRASOUND TECHNIQUE: Gray-scale sonography with compression, as well as color and duplex ultrasound, were performed to evaluate the deep venous system(s) from the level of the common femoral vein through the popliteal and proximal calf veins. COMPARISON:  07/19/2018. FINDINGS: VENOUS Normal compressibility of the common femoral, superficial femoral, and popliteal veins, as well as the visualized calf veins. Visualized portions of profunda femoral vein unremarkable. No filling defects to suggest DVT on grayscale or color Doppler imaging. Doppler waveforms show normal direction of  venous flow, normal respiratory plasticity and response to augmentation. Limited views of the contralateral common femoral vein are unremarkable. OTHER None. Limitations: none IMPRESSION: Negative for DVT in the right lower extremity Electronically Signed   By: Merilyn Baba [name]   On: 02/12/2022 17:32    Cardiac Studies   Echocardiogram: 02/13/2022 IMPRESSIONS     1. Left ventricular ejection fraction, by estimation, is 60 to 65%. The  left ventricle has normal function. The left ventricle has no regional  wall motion abnormalities. Left ventricular diastolic parameters were  normal. The average left ventricular  global longitudinal strain is -19.9 %. The global longitudinal strain is  normal.   2. Right ventricular systolic function is  normal. The right ventricular  size is normal.   3. Left atrial size was mildly dilated.   4. The mitral valve is normal in structure. Trivial mitral valve  regurgitation. No evidence of mitral stenosis.   5. The aortic valve is tricuspid. Aortic valve regurgitation is not  visualized. No aortic stenosis is present.   6. The inferior vena cava is normal in size with greater than 50%  respiratory variability, suggesting right atrial pressure of 3 mmHg.   Comparison(s): No significant change from prior study.   Patient Profile     76 y.o. male w/ PMH of CAD (s/p CABG in 2000 with subsequent PCI/DES to RCA in 2009, caths in 12/2017 and 01/2020 showing patent grafts), HTN, HLD, paroxysmal atrial fibrillation, PSVT, DM type II, CKD stage III, anxiety, iron deficiency anemia and GERD who is currently admitted for evaluation of chest pain.   Assessment & Plan    1.  Chest pain concerning for Angina/ Known CAD - He is s/p CABG in 2000 with subsequent PCI/DES to RCA in 2009 and recent catheterizations in 2019 and 2021 showed patent grafts.  - Presented with chest pain occurring with activity and relieved with rest or NTG. Hs Troponin values have been  negative with EKG showing no acute ST changes.  - Echo this admission shows a preserved EF of 60-65% with no regional WMA and no significant valve abnormalities. Planning for [facility] today to rule-out ischemia. If his stress test is reassuring, it is possible bradycardia could be playing a role with associated chronotropic incompetence as he was on Toprol-XL '75mg'$  daily prior to admission with HR in the 40's to 50's today despite this being held yesterday and he reports similar readings at home. Given HR in the 40's, will stop Toprol-XL for now. Continue ASA, Atorvastatin and Ranexa. PTA Imdur currently held due to soft BP.   2. Syncopal episodes/Bradycardia - Reports variable episodes of syncope at home with no clear etiology and no symptoms prior to the events. On telemetry, he has been bradycardic with HR in the 40's to 50's despite Toprol-XL being held yesterday. Holding now given HR in the 40's but will need to monitor for rebound tachycardia. Orthostatics negative yesterday. No evidence of high-grade AV block but did have nocturnal pauses up to 3.2 seconds overnight and would benefit from a sleep  study as an outpatient. Can also consider an outpatient monitor for further assessment of his HR.   3. Paroxysmal atrial fibrillation - He has a history of paroxysmal atrial fibrillation but overall low-burden at 1% by his monitor in 2021 and HR averaged in the 90's while in atrial fibrillation. Given his bradycardia while in sinus rhythm, will hold his BB as discussed above. Continue Eliquis '5mg'$  BID for anticoagulation.   4. HTN - BP has been stable, at 110/60 on most recent check. Holding Amlodipine, Imdur and Metoprolol for now.   5. HLD - LDL at 46 in 06/2021. Remains on Atorvastatin and Fenofibrate.    6. Type 2 DM - A1c 7.6 01/2022. On insulin at home. Management per the admitting team.   7. Stage 3 CKD - Creatinine at 1.55 this AM which is close to his baseline.   For questions or  updates, please contact [place] Please consult [email or web address] for contact info under        Signed, Erma Heritage, PA-C  02/14/2022, 8:50 AM

## 2022-02-14 NOTE — Progress Notes (Signed)
Discharge instructions reviewed with patient and daughter, written copy given.Both verbalize understanding. Patient via wheelchair to waiting car in stable condition ?

## 2022-02-14 NOTE — Discharge Summary (Signed)
Physician Discharge Summary  [name] [name] HER:[id number] DOB: [birth year] DOA: 02/12/2022  PCP: [name], MD  Admit date: 02/12/2022 Discharge date: 02/14/2022  Admitted From: Home Disposition:  Home  Recommendations for Outpatient Follow-up:  Follow up with PCP in 1-2 weeks Please obtain BMP/CBC in one week Please follow up with cardiology as scheduled  Home Health:None  Equipment/Devices:None  Discharge Condition:Stable  CODE STATUS:Full  Diet recommendation: Low salt low carb diet    Brief/Interim Summary: [name] [name] is a 76 y.o. male with medical history significant for coronary artery disease status post CABG in 2000, type 2 diabetes mellitus, generalized anxiety disorder, hypertension, GERD, hyperlipidemia, paroxysmal atrial fibrillation chronically anticoagulated on Eliquis, stage IIIb CKD with baseline creatinine 1.6-2.0, who is admitted to [facility] on 02/12/2022 by way of transfer from [facility] emergency department for further evaluation of chest pain.  Cardiac evaluation reassuringly unremarkable - lexiscan today was low risk. Cardiology cleared for discharge for further outpatient evaluation and treatment per their schedule. Follow up with PCP in the next 1-2 weeks as scheduled. Of note patient's HTN and glucose were extremely well controlled here despite home medications being held/weaned down aggressively. Insulin decreased to 20u daily given his borderline hypoglycemia here on high dose insulin (likely indicating extremely poor diet at home) and given borderline bradycardia/hypotension patient's metoprolol, amlodipine, isosorbide, and furosemide were all discontinued. See med rec below for further medication changes.  Discharge Diagnoses:  Principal Problem:   Chest pain Active Problems:   RLS (restless legs syndrome)   GERD (gastroesophageal reflux disease)   Essential hypertension   HLD (hyperlipidemia)   PAF (paroxysmal atrial  fibrillation) (HCC)   Seizure disorder (HCC)   Angina at rest [facility])   Hypoglycemia   Anxiety  Atypical chest pain --Echo unchanged since prior -Cardiology outpatient follow up as scheduled -Troponin and EKG reassuringly negative; stress test low risk -Continue aspirin, statin, ranolazine only per cardiology   Paroxysmal Afib -rate controlled, continue eliquis HTN -currently well controlled despite holding isosorbide, metoprolol, and amlodipine.  CKD3B chronic, at baseline -continue to follow GERD -continue PPI HLD Continue fibrate/statin Iron deficiency anemia, at baseline continue to follow Diabetes mellitus type 2, non-insulin-dependent, uncontrolled with hyperglycemia Continue insulin at 20u daily given borderline hypoglycemia in the hospital - likely poor PO intake at home given elvated glucose  Seizure disorder -continue home Depakote Anxiety, generalized currently well controlled - no longer taking buspar    Discharge Instructions   Allergies as of 02/14/2022       Reactions   Morphine Other (See Comments)   Hallucinations   Shellfish-derived Products Itching        Medication List     STOP taking these medications    albuterol 108 (90 Base) MCG/ACT inhaler Commonly known as: VENTOLIN HFA   amLODipine 5 MG tablet Commonly known as: NORVASC   busPIRone 7.5 MG tablet Commonly known as: BUSPAR   famotidine 20 MG tablet Commonly known as: PEPCID   Fluad Quadrivalent 0.5 ML injection Generic drug: influenza vaccine adjuvanted   furosemide 20 MG tablet Commonly known as: LASIX   hydrocortisone 2.5 % cream   isosorbide mononitrate 120 MG 24 hr tablet Commonly known as: IMDUR   meclizine 25 MG tablet Commonly known as: ANTIVERT   metoprolol succinate 50 MG 24 hr tablet Commonly known as: TOPROL-XL   ondansetron 4 MG tablet Commonly known as: Zofran   Pfizer COVID-19 Vac Bivalent injection Generic drug: COVID-19 mRNA bivalent vaccine [employer]rap[employer], [employer])  sulfamethoxazole-trimethoprim 800-160 MG tablet Commonly known as: BACTRIM DS   tiZANidine 4 MG tablet Commonly known as: ZANAFLEX   traMADol 50 MG tablet Commonly known as: ULTRAM       TAKE these medications    aspirin EC 81 MG tablet Take 81 mg by mouth daily. Swallow whole.   atorvastatin 80 MG tablet Commonly known as: LIPITOR Take 1 tablet (80 mg total) by mouth daily. What changed: when to take this   cetirizine 10 MG tablet Commonly known as: ZYRTEC TAKE 1 TABLET (10 MG TOTAL) BY MOUTH DAILY. What changed: how much to take   divalproex 500 MG DR tablet Commonly known as: Depakote Take 1 tablet (500 mg total) by mouth 2 (two) times daily.   Eliquis 5 MG Tabs tablet Generic drug: apixaban Take 1 tablet (5 mg total) by mouth 2 (two) times daily.   fenofibrate 160 MG tablet Take 1 tablet (160 mg total) by mouth daily. Start taking on: February 15, 2022 What changed:  medication strength how much to take   FreeStyle Libre 2 Sensor Misc Use as instructed to check blood sugar, change every 14 days   FreeStyle Libre 2 Sensor Misc Use as directed every 14 days   glucose blood test strip Commonly known as: Accu-Chek Aviva Use as directed to check blood sugar 4 times daily.  DX E11.9   hydrOXYzine 10 MG tablet Commonly known as: ATARAX Take 1 tablet (10 mg total) by mouth 3 (three) times daily as needed for anxiety or itching. What changed: when to take this   levothyroxine 100 MCG tablet Commonly known as: SYNTHROID TAKE 1 TABLET (100 MCG TOTAL) BY MOUTH DAILY. What changed: how much to take   nitroGLYCERIN 0.4 MG SL tablet Commonly known as: NITROSTAT PLACE 1 TABLET UNDER THE TONGUE EVERY 5 MINUTES AS NEEDED FOR CHEST PAIN.   pantoprazole 40 MG tablet Commonly known as: PROTONIX TAKE 1 TABLET BY MOUTH EVERY DAY   Pennsaid 2 % Soln Generic drug: Diclofenac Sodium Place 1 application onto the skin 2 (two) times daily. What changed:  when to take  this reasons to take this   pramipexole 1 MG tablet Commonly known as: Mirapex Take 1/2 to 1 tablet by mouth at bedtime What changed: how much to take   ranolazine 500 MG 12 hr tablet Commonly known as: RANEXA TAKE 1 TABLET BY MOUTH TWICE DAILY What changed: how much to take   sertraline 100 MG tablet Commonly known as: ZOLOFT TAKE 1 TABLET BY MOUTH TWICE DAILY What changed: how much to take   Toujeo Max SoloStar 300 UNIT/ML Solostar Pen Generic drug: insulin glargine (2 Unit Dial) Inject 20 Units into the skin every morning. And pen needles 1/day What changed: how much to take   triamcinolone cream 0.1 % Commonly known as: KENALOG Apply 1 application topically 2 (two) times daily. Use as needed for itchy rash What changed:  when to take this reasons to take this Another medication with the same name was removed. Continue taking this medication, and follow the directions you see here.   Vitamin D (Ergocalciferol) 1.25 MG (50000 UNIT) Caps capsule Commonly known as: DRISDOL TAKE 1 CAPSULE BY MOUTH EVERY 7 DAYS What changed: how much to take        Allergies  Allergen Reactions   Morphine Other (See Comments)    Hallucinations   Shellfish-Derived Products Itching    Consultations: Cardiology  Procedures/Studies: DG Chest 2 View  Result Date: 02/12/2022 CLINICAL DATA:  chest pain EXAM: CHEST - 2 VIEW COMPARISON:  Chest x-ray 03/25/2021, CT chest 03/22/2016 FINDINGS: Cardiovascular surgical changes overlie the mediastinum. Query associated coronary artery stent. The heart and mediastinal contours are unchanged. No focal consolidation. No pulmonary edema. No pleural effusion. No pneumothorax. No acute osseous abnormality. IMPRESSION: No active cardiopulmonary disease. Electronically Signed   By: Iven Finn [name]   On: 02/12/2022 15:17   CT Head Wo Contrast  Result Date: 02/12/2022 CLINICAL DATA:  Dizziness, persistent/recurrent, cardiac or vascular cause  suspected EXAM: CT HEAD WITHOUT CONTRAST TECHNIQUE: Contiguous axial images were obtained from the base of the skull through the vertex without intravenous contrast. RADIATION DOSE REDUCTION: This exam was performed according to the departmental dose-optimization program which includes automated exposure control, adjustment of the mA and/or kV according to patient size and/or use of iterative reconstruction technique. COMPARISON:  08/30/2021. FINDINGS: Brain: No evidence of acute large vascular territory infarction, hemorrhage, hydrocephalus, extra-axial collection or mass lesion/mass effect. Similar cerebral atrophy and mild chronic microvascular ischemic disease. Vascular: No hyperdense vessel identified. Calcific intracranial atherosclerosis. Skull: No acute fracture. Sinuses/Orbits: Mild scattered paranasal sinus mucosal thickening with secretions in the left frontal sinus. No acute orbital findings. Other: No mastoid effusions. IMPRESSION: 1. No evidence of acute intracranial abnormality. 2. Similar atrophy and chronic microvascular ischemic disease. Electronically Signed   By: Margaretha Sheffield [name]   On: 02/12/2022 19:12   NM Myocar Multi W/Spect W/Wall Motion / EF  Result Date: 02/14/2022 CLINICAL DATA:  Nonspecific chest pain. EXAM: MYOCARDIAL IMAGING WITH SPECT (REST AND PHARMACOLOGIC-STRESS) GATED LEFT VENTRICULAR WALL MOTION STUDY LEFT VENTRICULAR EJECTION FRACTION TECHNIQUE: Standard myocardial SPECT imaging was performed after resting intravenous injection of 9.8 mCi Tc-96mtetrofosmin. Subsequently, intravenous infusion of Lexiscan was performed under the supervision of the Cardiology staff. At peak effect of the drug, 32.7 mCi Tc-919metrofosmin was injected intravenously and standard myocardial SPECT imaging was performed. Quantitative gated imaging was also performed to evaluate left ventricular wall motion, and estimate left ventricular ejection fraction. COMPARISON:  None. FINDINGS:  Perfusion: No decreased activity in the left ventricle on stress imaging to suggest reversible ischemia or infarction. Wall Motion: Normal left ventricular wall motion. No left ventricular dilation. Left Ventricular Ejection Fraction: 58 % End diastolic volume 84 ml End systolic volume 36 ml IMPRESSION: 1. No reversible ischemia or infarction. 2. Normal left ventricular wall motion. 3. Left ventricular ejection fraction 58% 4. Non invasive risk stratification*: Low *2012 Appropriate Use Criteria for Coronary Revascularization Focused Update: J Am Coll Cardiol. 202505;39(7):[email or web address]://[email or web address] Electronically Signed   By: KyAbigail Miyamoto.D.   On: 02/14/2022 14:58   [place]ous Img Lower Unilateral Right (DVT)  Result Date: 02/12/2022 CLINICAL DATA:  Right lower leg swelling EXAM: RIGHT LOWER EXTREMITY VENOUS DOPPLER ULTRASOUND TECHNIQUE: Gray-scale sonography with compression, as well as color and duplex ultrasound, were performed to evaluate the deep venous system(s) from the level of the common femoral vein through the popliteal and proximal calf veins. COMPARISON:  07/19/2018. FINDINGS: VENOUS Normal compressibility of the common femoral, superficial femoral, and popliteal veins, as well as the visualized calf veins. Visualized portions of profunda femoral vein unremarkable. No filling defects to suggest DVT on grayscale or color Doppler imaging. Doppler waveforms show normal direction of venous flow, normal respiratory plasticity and response to augmentation. Limited views of the contralateral common femoral vein are unremarkable. OTHER None. Limitations: none IMPRESSION: Negative for DVT in the right lower extremity Electronically Signed   By: AlFrancetta Found.  On: 02/12/2022 17:32   ECHOCARDIOGRAM COMPLETE  Result Date: 02/13/2022    ECHOCARDIOGRAM REPORT   Patient Name:   [name] [name] Date of Exam: 02/13/2022 Medical Rec #:  [id number]         Height:        69.0 in Accession #:    [id number]        Weight:       190.0 lb Date of Birth:  16-Aug-1946         BSA:          2.021 m Patient Age:    32 years          BP:           122/54 mmHg Patient Gender: M                 HR:           55 bpm. Exam Location:  Inpatient Procedure: 2D Echo, Cardiac Doppler, Color Doppler and Strain Analysis Indications:    Chest Pain R07.9  History:        Patient has prior history of Echocardiogram examinations, most                 recent 01/08/2020. CAD, Prior CABG, TIA and Carotid Disease,                 Arrythmias:RBBB, Atrial Fibrillation and Bradycardia,                 Signs/Symptoms:Murmur; Risk Factors:Diabetes, Hypertension,                 Dyslipidemia and Sleep Apnea. Chronic kidney disease.  Sonographer:    Darlina Sicilian RDCS Referring Phys: [id number] [place]  1. Left ventricular ejection fraction, by estimation, is 60 to 65%. The left ventricle has normal function. The left ventricle has no regional wall motion abnormalities. Left ventricular diastolic parameters were normal. The average left ventricular global longitudinal strain is -19.9 %. The global longitudinal strain is normal.  2. Right ventricular systolic function is normal. The right ventricular size is normal.  3. Left atrial size was mildly dilated.  4. The mitral valve is normal in structure. Trivial mitral valve regurgitation. No evidence of mitral stenosis.  5. The aortic valve is tricuspid. Aortic valve regurgitation is not visualized. No aortic stenosis is present.  6. The inferior vena cava is normal in size with greater than 50% respiratory variability, suggesting right atrial pressure of 3 mmHg. Comparison(s): No significant change from prior study. Conclusion(s)/Recommendation(s): Otherwise normal echocardiogram, with minor abnormalities described in the report. FINDINGS  Left Ventricle: Left ventricular ejection fraction, by estimation, is 60 to 65%. The left ventricle has normal  function. The left ventricle has no regional wall motion abnormalities. The average left ventricular global longitudinal strain is -19.9 %. The global longitudinal strain is normal. The left ventricular internal cavity size was normal in size. There is no left ventricular hypertrophy. Left ventricular diastolic parameters were normal. Right Ventricle: The right ventricular size is normal. No increase in right ventricular wall thickness. Right ventricular systolic function is normal. Left Atrium: Left atrial size was mildly dilated. Right Atrium: Right atrial size was normal in size. Pericardium: There is no evidence of pericardial effusion. Mitral Valve: The mitral valve is normal in structure. Trivial mitral valve regurgitation. No evidence of mitral valve stenosis. Tricuspid Valve: The tricuspid valve is normal in structure. Tricuspid valve regurgitation is trivial. No evidence of  tricuspid stenosis. Aortic Valve: The aortic valve is tricuspid. Aortic valve regurgitation is not visualized. No aortic stenosis is present. Pulmonic Valve: The pulmonic valve was not well visualized. Pulmonic valve regurgitation is not visualized. No evidence of pulmonic stenosis. Aorta: The aortic root, ascending aorta and aortic arch are all structurally normal, with no evidence of dilitation or obstruction. Venous: The inferior vena cava is normal in size with greater than 50% respiratory variability, suggesting right atrial pressure of 3 mmHg. IAS/Shunts: The atrial septum is grossly normal.  LEFT VENTRICLE PLAX 2D LVIDd:         4.70 cm   Diastology LVIDs:         2.90 cm   LV e' medial:    6.75 cm/s LV PW:         1.00 cm   LV E/e' medial:  10.9 LV IVS:        1.10 cm   LV e' lateral:   10.80 cm/s LVOT diam:     2.00 cm   LV E/e' lateral: 6.8 LV SV:         63 LV SV Index:   31        2D Longitudinal Strain LVOT Area:     3.14 cm  2D Strain GLS (A2C):   -17.7 %                          2D Strain GLS (A3C):   -19.8 %                           2D Strain GLS (A4C):   -22.1 %                          2D Strain GLS Avg:     -19.9 % RIGHT VENTRICLE RV S prime:     10.30 cm/s TAPSE (M-mode): 1.8 cm LEFT ATRIUM             Index        RIGHT ATRIUM           Index LA diam:        4.30 cm 2.13 cm/m   RA Area:     16.30 cm LA Vol (A2C):   57.1 ml 28.25 ml/m  RA Volume:   35.00 ml  17.31 ml/m LA Vol (A4C):   56.3 ml 27.85 ml/m LA Biplane Vol: 59.8 ml 29.58 ml/m  AORTIC VALVE LVOT Vmax:   84.00 cm/s LVOT Vmean:  54.200 cm/s LVOT VTI:    0.201 m  AORTA Ao Root diam: 3.40 cm Ao Asc diam:  3.00 cm MITRAL VALVE MV Area (PHT): 3.21 cm    SHUNTS MV Decel Time: 236 msec    Systemic VTI:  0.20 m MV E velocity: 73.90 cm/s  Systemic Diam: 2.00 cm MV A velocity: 59.20 cm/s MV E/A ratio:  1.25 Buford Dresser MD Electronically signed by Buford Dresser MD Signature Date/Time: 02/13/2022/1:34:33 PM    Final      Subjective: No acute issues/events overnight   Discharge Exam: Vitals:   02/14/22 1222 02/14/22 1624  BP: (!) 125/59 125/67  Pulse: 62 (!) 55  Resp: 17 16  Temp: 98.1 F (36.7 C) 98 F (36.7 C)  SpO2:  99%   Vitals:   02/14/22 1053 02/14/22 1055 02/14/22 1222 02/14/22 1624  BP: 136/60 (!) 141/60 (!) 125/59  125/67  Pulse: 72 68 62 (!) 55  Resp:   17 16  Temp:   98.1 F (36.7 C) 98 F (36.7 C)  TempSrc:   Oral Oral  SpO2:    99%  Weight:      Height:        General: Pt is alert, awake, not in acute distress Cardiovascular: RRR, S1/S2 +, no rubs, no gallops Respiratory: CTA bilaterally, no wheezing, no rhonchi Abdominal: Soft, NT, ND, bowel sounds + Extremities: no edema, no cyanosis    The results of significant diagnostics from this hospitalization (including imaging, microbiology, ancillary and laboratory) are listed below for reference.     Microbiology: Recent Results (from the past 240 hour(s))  Resp Panel by RT-PCR (Flu A&B, Covid) Nasopharyngeal Swab     Status: None   Collection Time:  02/12/22  4:04 PM   Specimen: Nasopharyngeal Swab; Nasopharyngeal(NP) swabs in vial transport medium  Result Value Ref Range Status   SARS Coronavirus 2 by RT PCR NEGATIVE NEGATIVE Final    Comment: (NOTE) SARS-CoV-2 target nucleic acids are NOT DETECTED.  The SARS-CoV-2 RNA is generally detectable in upper respiratory specimens during the acute phase of infection. The lowest concentration of SARS-CoV-2 viral copies this assay can detect is 138 copies/mL. A negative result does not preclude SARS-Cov-2 infection and should not be used as the sole basis for treatment or other patient management decisions. A negative result may occur with  improper specimen collection/handling, submission of specimen other than nasopharyngeal swab, presence of viral mutation(s) within the areas targeted by this assay, and inadequate number of viral copies(<138 copies/mL). A negative result must be combined with clinical observations, patient history, and epidemiological information. The expected result is Negative.  Fact Sheet for Patients:  EntrepreneurPulse.com.au  Fact Sheet for Healthcare Providers:  IncredibleEmployment.be  This test is no t yet approved or cleared by the [place] FDA and  has been authorized for detection and/or diagnosis of SARS-CoV-2 by FDA under an Emergency Use Authorization (EUA). This EUA will remain  in effect (meaning this test can be used) for the duration of the COVID-19 declaration under Section 564(b)(1) of the Act, 21 U.S.C.section 360bbb-3(b)(1), unless the authorization is terminated  or revoked sooner.       Influenza A by PCR NEGATIVE NEGATIVE Final   Influenza B by PCR NEGATIVE NEGATIVE Final    Comment: (NOTE) The Xpert Xpress SARS-CoV-2/FLU/RSV plus assay is intended as an aid in the diagnosis of influenza from Nasopharyngeal swab specimens and should not be used as a sole basis for treatment. Nasal washings  and aspirates are unacceptable for Xpert Xpress SARS-CoV-2/FLU/RSV testing.  Fact Sheet for Patients: EntrepreneurPulse.com.au  Fact Sheet for Healthcare Providers: IncredibleEmployment.be  This test is not yet approved or cleared by the [place] FDA and has been authorized for detection and/or diagnosis of SARS-CoV-2 by FDA under an Emergency Use Authorization (EUA). This EUA will remain in effect (meaning this test can be used) for the duration of the COVID-19 declaration under Section 564(b)(1) of the Act, 21 U.S.C. section 360bbb-3(b)(1), unless the authorization is terminated or revoked.  Performed at [facility], [address] [place], [place] [address]   MRSA Next Gen by PCR, Nasal     Status: None   Collection Time: 02/12/22  9:13 PM   Specimen: Nasal Mucosa; Nasal Swab  Result Value Ref Range Status   MRSA by PCR Next Gen NOT DETECTED NOT DETECTED Final    Comment: (NOTE)  The GeneXpert MRSA Assay (FDA approved for NASAL specimens only), is one component of a comprehensive MRSA colonization surveillance program. It is not intended to diagnose MRSA infection nor to guide or monitor treatment for MRSA infections. Test performance is not FDA approved in patients less than 76 years old. Performed at [facility], [place] [address] [place], [place] [address]      Labs: BNP (last 3 results) Recent Labs    08/30/21 1609  BNP 989.2*   Basic Metabolic Panel: Recent Labs  Lab 02/12/22 1450 02/13/22 0049 02/14/22 0004  NA 139 139 140  K 3.7 3.7 3.8  CL 105 108 109  CO2 '26 24 25  '$ GLUCOSE 188* 165* 46*  BUN 24* 19 14  CREATININE 1.61* 1.51* 1.55*  CALCIUM 9.4 9.0 9.5  MG  --  2.0  --    Liver Function Tests: Recent Labs  Lab 02/12/22 1457 02/13/22 0049  AST 21 23  ALT 16 17  ALKPHOS 53 51  BILITOT 0.7 0.4  PROT 7.3 6.1*  ALBUMIN 4.3 3.5   Recent Labs  Lab 02/13/22 0049  LIPASE 25    No results for input(s): AMMONIA in the last 168 hours. CBC: Recent Labs  Lab 02/12/22 1450 02/13/22 0049 02/14/22 0004  WBC 9.0 7.9 7.9  NEUTROABS  --  4.9  --   HGB 12.2* 11.8* 13.6  HCT 36.5* 33.8* 40.0  MCV 88.0 87.3 88.1  PLT 286 252 272   Cardiac Enzymes: No results for input(s): CKTOTAL, CKMB, CKMBINDEX, TROPONINI in the last 168 hours. BNP: Invalid input(s): POCBNP CBG: Recent Labs  Lab 02/13/22 1617 02/13/22 2130 02/14/22 0610 02/14/22 1229 02/14/22 1623  GLUCAP 127* 122* 57* 78 246*   D-Dimer No results for input(s): DDIMER in the last 72 hours. Hgb A1c No results for input(s): HGBA1C in the last 72 hours. Lipid Profile No results for input(s): CHOL, HDL, LDLCALC, TRIG, CHOLHDL, LDLDIRECT in the last 72 hours. Thyroid function studies No results for input(s): TSH, T4TOTAL, T3FREE, THYROIDAB in the last 72 hours.  Invalid input(s): FREET3 Anemia work up No results for input(s): VITAMINB12, FOLATE, FERRITIN, TIBC, IRON, RETICCTPCT in the last 72 hours. Urinalysis    Component Value Date/Time   COLORURINE YELLOW 08/30/2021 1905   APPEARANCEUR CLEAR 08/30/2021 1905   LABSPEC 1.010 08/30/2021 1905   PHURINE 6.5 08/30/2021 1905   GLUCOSEU >=500 (A) 08/30/2021 1905   GLUCOSEU >=1000 (A) 08/20/2019 0924   HGBUR NEGATIVE 08/30/2021 1905   HGBUR negative 12/10/2010 1354   BILIRUBINUR NEGATIVE 08/30/2021 1905   BILIRUBINUR neg 03/06/2013 1637   KETONESUR NEGATIVE 08/30/2021 1905   PROTEINUR NEGATIVE 08/30/2021 1905   UROBILINOGEN 0.2 08/20/2019 0924   NITRITE NEGATIVE 08/30/2021 1905   LEUKOCYTESUR NEGATIVE 08/30/2021 1905   Sepsis Labs Invalid input(s): PROCALCITONIN,  WBC,  LACTICIDVEN Microbiology Recent Results (from the past 240 hour(s))  Resp Panel by RT-PCR (Flu A&B, Covid) Nasopharyngeal Swab     Status: None   Collection Time: 02/12/22  4:04 PM   Specimen: Nasopharyngeal Swab; Nasopharyngeal(NP) swabs in vial transport medium  Result Value  Ref Range Status   SARS Coronavirus 2 by RT PCR NEGATIVE NEGATIVE Final    Comment: (NOTE) SARS-CoV-2 target nucleic acids are NOT DETECTED.  The SARS-CoV-2 RNA is generally detectable in upper respiratory specimens during the acute phase of infection. The lowest concentration of SARS-CoV-2 viral copies this assay can detect is 138 copies/mL. A negative result does not preclude SARS-Cov-2 infection and should  not be used as the sole basis for treatment or other patient management decisions. A negative result may occur with  improper specimen collection/handling, submission of specimen other than nasopharyngeal swab, presence of viral mutation(s) within the areas targeted by this assay, and inadequate number of viral copies(<138 copies/mL). A negative result must be combined with clinical observations, patient history, and epidemiological information. The expected result is Negative.  Fact Sheet for Patients:  EntrepreneurPulse.com.au  Fact Sheet for Healthcare Providers:  IncredibleEmployment.be  This test is no t yet approved or cleared by the [place] FDA and  has been authorized for detection and/or diagnosis of SARS-CoV-2 by FDA under an Emergency Use Authorization (EUA). This EUA will remain  in effect (meaning this test can be used) for the duration of the COVID-19 declaration under Section 564(b)(1) of the Act, 21 U.S.C.section 360bbb-3(b)(1), unless the authorization is terminated  or revoked sooner.       Influenza A by PCR NEGATIVE NEGATIVE Final   Influenza B by PCR NEGATIVE NEGATIVE Final    Comment: (NOTE) The Xpert Xpress SARS-CoV-2/FLU/RSV plus assay is intended as an aid in the diagnosis of influenza from Nasopharyngeal swab specimens and should not be used as a sole basis for treatment. Nasal washings and aspirates are unacceptable for Xpert Xpress SARS-CoV-2/FLU/RSV testing.  Fact Sheet for  Patients: EntrepreneurPulse.com.au  Fact Sheet for Healthcare Providers: IncredibleEmployment.be  This test is not yet approved or cleared by the [place] FDA and has been authorized for detection and/or diagnosis of SARS-CoV-2 by FDA under an Emergency Use Authorization (EUA). This EUA will remain in effect (meaning this test can be used) for the duration of the COVID-19 declaration under Section 564(b)(1) of the Act, 21 U.S.C. section 360bbb-3(b)(1), unless the authorization is terminated or revoked.  Performed at [facility], [address], [place], [place] [address]   MRSA Next Gen by PCR, Nasal     Status: None   Collection Time: 02/12/22  9:13 PM   Specimen: Nasal Mucosa; Nasal Swab  Result Value Ref Range Status   MRSA by PCR Next Gen NOT DETECTED NOT DETECTED Final    Comment: (NOTE) The GeneXpert MRSA Assay (FDA approved for NASAL specimens only), is one component of a comprehensive MRSA colonization surveillance program. It is not intended to diagnose MRSA infection nor to guide or monitor treatment for MRSA infections. Test performance is not FDA approved in patients less than 39 years old. Performed at [facility], [place] [address], [place], [place] [address]      Time coordinating discharge: Over 30 minutes  SIGNED:   Little Ishikawa, DO [facility] 02/14/2022, 4:50 PM Pager   If 7PM-7AM, please contact night-coverage [email or web address]

## 2022-02-14 NOTE — Plan of Care (Signed)
?  Problem: Education: ?Goal: Understanding of cardiac disease, CV risk reduction, and recovery process will improve ?Outcome: Completed/Met ?Goal: Individualized Educational Video(s) ?Outcome: Completed/Met ?  ?Problem: Activity: ?Goal: Ability to tolerate increased activity will improve ?Outcome: Completed/Met ?  ?Problem: Cardiac: ?Goal: Ability to achieve and maintain adequate cardiovascular perfusion will improve ?Outcome: Completed/Met ?  ?Problem: Health Behavior/Discharge Planning: ?Goal: Ability to safely manage health-related needs after discharge will improve ?Outcome: Completed/Met ?  ?

## 2022-02-15 ENCOUNTER — Other Ambulatory Visit (HOSPITAL_BASED_OUTPATIENT_CLINIC_OR_DEPARTMENT_OTHER): Payer: Self-pay

## 2022-02-15 ENCOUNTER — Telehealth: Payer: Self-pay

## 2022-02-15 NOTE — Telephone Encounter (Signed)
Transition Care Management Follow-up Telephone Call ?Date of discharge and from where: 02/14/2022-[facility] ?How have you been since you were released from the hospital? Feeling ok ?Any questions or concerns? No ? ?Items Reviewed: ?Did the pt receive and understand the discharge instructions provided? Yes  ?Medications obtained and verified? Yes  ?Other? Yes  ?Any new allergies since your discharge? No  ?Dietary orders reviewed? Yes ?Do you have support at home? Yes  ? ?Home Care and Equipment/Supplies: ?Were home health services ordered? no ?If so, what is the name of the agency? N/a  ?Has the agency set up a time to come to the patient's home? not applicable ?Were any new equipment or medical supplies ordered?  No ?What is the name of the medical supply agency? N/a ?Were you able to get the supplies/equipment? not applicable ?Do you have any questions related to the use of the equipment or supplies? N/a ? ?Functional Questionnaire: (I = Independent and D = Dependent) ?ADLs: I ? ?Bathing/Dressing- I ? ?Meal Prep- I ? ?Eating- I ? ?Maintaining continence- I ? ?Transferring/Ambulation- I ? ?Managing Meds- I ? ?Follow up appointments reviewed: ? ?PCP Hospital f/u appt confirmed? Yes  Scheduled to see Mackie Pai on 02/23/2022 @ 10:40. ?[facility] f/u appt confirmed? No  Cardiology to schedule ?Are transportation arrangements needed? No  ?If their condition worsens, is the pt aware to call PCP or go to the Emergency Dept.? Yes ?Was the patient provided with contact information for the PCP's office or ED? Yes ?Was to pt encouraged to call back with questions or concerns? Yes ? ?

## 2022-02-15 NOTE — Telephone Encounter (Signed)
Transition Care Management Unsuccessful Follow-up Telephone Call ? ?Date of discharge and from where:  02/14/2022-[facility] ? ?Attempts:  2nd Attempt ? ?Reason for unsuccessful TCM follow-up call:  No answer/busy ? ? ? ?

## 2022-02-19 ENCOUNTER — Other Ambulatory Visit: Payer: Self-pay | Admitting: Interventional Cardiology

## 2022-02-19 ENCOUNTER — Other Ambulatory Visit (HOSPITAL_BASED_OUTPATIENT_CLINIC_OR_DEPARTMENT_OTHER): Payer: Self-pay

## 2022-02-19 DIAGNOSIS — I4821 Permanent atrial fibrillation: Secondary | ICD-10-CM

## 2022-02-19 MED ORDER — APIXABAN 5 MG PO TABS
5.0000 mg | ORAL_TABLET | Freq: Two times a day (BID) | ORAL | 5 refills | Status: DC
  Filled 2022-02-19: qty 60, 30d supply, fill #0
  Filled 2022-03-30: qty 60, 30d supply, fill #1
  Filled 2022-04-23 – 2022-05-11 (×2): qty 60, 30d supply, fill #2
  Filled 2022-06-10 – 2022-07-08 (×2): qty 60, 30d supply, fill #3
  Filled 2022-08-30: qty 60, 30d supply, fill #4
  Filled 2022-10-07: qty 60, 30d supply, fill #5

## 2022-02-19 NOTE — Telephone Encounter (Signed)
Prescription refill request for Eliquis received. ?Indication: Atrial fib ?Last office visit: 03/19/21  Lendell Caprice MD ?Scr: 1.55 on 02/14/22 ?Age: 76 ?Weight: 93.9kg ? ?Based on above findings Eliquis '5mg'$  twice daily is the appropriate dose.  Refill approved. ? ?Pt was recently discharged from [facility] and seen by Dr Caryl Comes.  Pt has upcoming appt with Dr Irish Lack on 02/26/22. ? ?

## 2022-02-23 ENCOUNTER — Inpatient Hospital Stay: Payer: Medicare Other | Admitting: Medical

## 2022-02-24 ENCOUNTER — Ambulatory Visit: Payer: Medicare Other | Admitting: Neurology

## 2022-02-24 ENCOUNTER — Other Ambulatory Visit: Payer: Self-pay | Admitting: Family Medicine

## 2022-02-24 ENCOUNTER — Other Ambulatory Visit (HOSPITAL_BASED_OUTPATIENT_CLINIC_OR_DEPARTMENT_OTHER): Payer: Self-pay

## 2022-02-24 MED ORDER — SERTRALINE HCL 100 MG PO TABS
ORAL_TABLET | Freq: Two times a day (BID) | ORAL | 0 refills | Status: DC
  Filled 2022-02-24: qty 180, 90d supply, fill #0

## 2022-02-24 NOTE — Telephone Encounter (Signed)
90 day supply sent, letter sent via mychart that Pt is overdue for visit.  ?

## 2022-02-25 ENCOUNTER — Other Ambulatory Visit (HOSPITAL_BASED_OUTPATIENT_CLINIC_OR_DEPARTMENT_OTHER): Payer: Self-pay
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
## 2022-02-26 ENCOUNTER — Encounter: Payer: Self-pay | Admitting: Interventional Cardiology

## 2022-02-26 ENCOUNTER — Other Ambulatory Visit: Payer: Self-pay

## 2022-02-26 ENCOUNTER — Other Ambulatory Visit (HOSPITAL_BASED_OUTPATIENT_CLINIC_OR_DEPARTMENT_OTHER): Payer: Self-pay

## 2022-02-26 ENCOUNTER — Ambulatory Visit (INDEPENDENT_AMBULATORY_CARE_PROVIDER_SITE_OTHER): Payer: Medicare Other | Admitting: Interventional Cardiology

## 2022-02-26 VITALS — BP 138/68 | HR 74 | Ht 69.0 in | Wt 187.2 lb

## 2022-02-26 DIAGNOSIS — I4821 Permanent atrial fibrillation: Secondary | ICD-10-CM

## 2022-02-26 DIAGNOSIS — I480 Paroxysmal atrial fibrillation: Secondary | ICD-10-CM | POA: Diagnosis not present

## 2022-02-26 DIAGNOSIS — I25700 Atherosclerosis of coronary artery bypass graft(s), unspecified, with unstable angina pectoris: Secondary | ICD-10-CM | POA: Diagnosis not present

## 2022-02-26 DIAGNOSIS — I10 Essential (primary) hypertension: Secondary | ICD-10-CM

## 2022-02-26 DIAGNOSIS — I5032 Chronic diastolic (congestive) heart failure: Secondary | ICD-10-CM

## 2022-02-26 DIAGNOSIS — N189 Chronic kidney disease, unspecified: Secondary | ICD-10-CM | POA: Diagnosis not present

## 2022-02-26 DIAGNOSIS — E785 Hyperlipidemia, unspecified: Secondary | ICD-10-CM | POA: Diagnosis not present

## 2022-02-26 MED ORDER — ISOSORBIDE MONONITRATE ER 120 MG PO TB24
120.0000 mg | ORAL_TABLET | Freq: Every day | ORAL | 3 refills | Status: DC
  Filled 2022-02-26: qty 90, 90d supply, fill #0
  Filled 2022-06-10: qty 90, 90d supply, fill #1
  Filled 2022-10-07: qty 90, 90d supply, fill #2

## 2022-02-26 NOTE — Patient Instructions (Addendum)
Medication Instructions:  ?Your physician has recommended you make the following change in your medication: Stop taking Amlodipine and metoprolol. Continue taking Isosorbide mononitrate 120 mg by mouth daily ? ?*If you need a refill on your cardiac medications before your next appointment, please call your pharmacy* ? ? ?Lab Work: ?none ?If you have labs (blood work) drawn today and your tests are completely normal, you will receive your results only by: ?MyChart Message (if you have MyChart) OR ?A paper copy in the mail ?If you have any lab test that is abnormal or we need to change your treatment, we will call you to review the results. ? ? ?Testing/Procedures: ?none ? ? ?Follow-Up: ?At [facility], you and your health needs are our priority.  As part of our continuing mission to provide you with exceptional heart care, we have created designated Provider Care Teams.  These Care Teams include your primary Cardiologist (physician) and Advanced Practice Providers (APPs -  Physician Assistants and Nurse Practitioners) who all work together to provide you with the care you need, when you need it. ? ?We recommend signing up for the patient portal called "MyChart".  Sign up information is provided on this After Visit Summary.  MyChart is used to connect with patients for Virtual Visits (Telemedicine).  Patients are able to view lab/test results, encounter notes, upcoming appointments, etc.  Non-urgent messages can be sent to your provider as well.   ?To learn more about what you can do with MyChart, go to [email or web address].   ? ?Your next appointment:   ?6 month(s) ? ?The format for your next appointment:   ?In Person ? ?Provider:   ?Larae Grooms, MD   ? ? ?Other Instructions ?You have been referred to hypertension clinic in our office.  Please schedule appointment for 3-4 weeks  ? ?

## 2022-03-01 ENCOUNTER — Ambulatory Visit (INDEPENDENT_AMBULATORY_CARE_PROVIDER_SITE_OTHER): Payer: Medicare Other | Admitting: Neurology

## 2022-03-01 ENCOUNTER — Encounter: Payer: Self-pay | Admitting: Neurology

## 2022-03-01 ENCOUNTER — Other Ambulatory Visit (HOSPITAL_BASED_OUTPATIENT_CLINIC_OR_DEPARTMENT_OTHER): Payer: Self-pay

## 2022-03-01 VITALS — BP 132/43 | HR 57 | Ht 69.0 in | Wt 185.0 lb

## 2022-03-01 DIAGNOSIS — G3184 Mild cognitive impairment, so stated: Secondary | ICD-10-CM | POA: Diagnosis not present

## 2022-03-01 DIAGNOSIS — R42 Dizziness and giddiness: Secondary | ICD-10-CM

## 2022-03-01 MED ORDER — DONEPEZIL HCL 10 MG PO TABS
10.0000 mg | ORAL_TABLET | Freq: Every day | ORAL | 4 refills | Status: DC
  Filled 2022-03-01: qty 90, 90d supply, fill #0
  Filled 2022-06-10: qty 90, 90d supply, fill #1

## 2022-03-01 NOTE — Progress Notes (Signed)
? ?[facility] ? ?PATIENT: [name] [name] ?DOB: 1946-09-27 ? ?REQUESTING CLINICIAN: [name], MD ?HISTORY FROM: Patient and daughter ?REASON FOR VISIT: Worsening memory  ? ? ?HISTORICAL ? ?CHIEF COMPLAINT:  ?Chief Complaint  ?Patient presents with  ? Follow-up  ?  Room 12, daughter  ?NX [place] 2022/Internal referral for dizziness ?Reports 1-2 months ago sx of dizziness, headaches and short term memory have increase, pt states sx are due to concussion 5 years ago   ? ? ?HISTORY OF PRESENT ILLNESS:  ?Patient presents today for follow-up, he is accompanied by his daughter.  Last visit was in August 2022.  Since then daughter has reported that patient memory got worse.  He does repeat himself, asking the same questions over and over, daughter reports trouble with short-term memory, sometimes gets frustrated with his memory.  He is still having hallucinations, visual only, and now he is getting paranoid. At time, he will call daughter by the wife's name who is deceased in September 10, 2021.  He still able to cook, clean, pays his bills, he does not drive, does have word finding difficulty and sometimes calling daughter by the wrong name.  Other than that he remains active, does a lot in the house.  He has occasional dizziness for which he will take meclizine.  Daughter has reported that the dizziness mostly happens after patient exerts himself or stand all morning cooking and cleaning.  Denies any falls recently.  ? ? ? ?PREVIOUS HISTORY FROM DR. WILLIS 07/07/2021:  ?[name] [name] is a 76 year old right-handed white male with a history of mild memory changes that date back several years. The patient was seen and evaluated by Dr. Tomi [name] in 2016 for some cognitive changes felt associated with prior concussions. The patient underwent neuropsychological evaluation in 2018, the results suggest that the patient did not have an organic dementia at that time.  The patient has had some history of seizure  type events, he was on Depakote for a number of years, but sometime since 2020, he went off the Depakote.  The patient now reports that he is having headaches with some regularity, at least 1 or 2 headaches a week.  The patient has not had any recent seizures.  He does have a history of diabetes, his most recent hemoglobin A1c was 7.2.  He reports that he chronically has insomnia, he will go to bed around 10:30 PM or 11 PM and wakes up around 330 or 4 AM and cannot get back to sleep.  He has drowsiness during the day, he will oftentimes take naps.  He has chronic fatigue.  He reports some short-term memory, he cannot remember recent events. He still drives a car but occasionally he will get lost.  He is able to keep up with his medications and appointments, he does some of the finances but his wife does most of them.  He does report some gait instability, he has significant right knee arthritis and will be having knee surgery for total knee replacement next week.  He denies any significant numbness of the feet.  He denies any focal weakness.  A recent CT scan of the brain does show some evidence of cerebrovascular disease including a left pontine stroke event.  He comes to this office for further evaluation. ? ? ?OTHER MEDICAL CONDITIONS: Atrial fibrillation on Eliquis, CAD sp quadruple bypass, Diabetes, Hypothyroidism, anxiety depression ? ? ?REVIEW OF SYSTEMS: Full 14 system review of systems performed and negative with exception of: as  noted in the HPI, ? ?ALLERGIES: ?Allergies  ?Allergen Reactions  ? Morphine Other (See Comments)  ?  Hallucinations  ? Shellfish-Derived Products Itching  ? ? ?HOME MEDICATIONS: ?Outpatient Medications Prior to Visit  ?Medication Sig Dispense Refill  ? apixaban (ELIQUIS) 5 MG TABS tablet Take 1 tablet (5 mg total) by mouth 2 (two) times daily. 60 tablet 5  ? aspirin EC 81 MG tablet Take 81 mg by mouth daily. Swallow whole.    ? atorvastatin (LIPITOR) 80 MG tablet Take 1 tablet (80  mg total) by mouth daily. 90 tablet 3  ? cetirizine (ZYRTEC) 10 MG tablet TAKE 1 TABLET (10 MG TOTAL) BY MOUTH DAILY. 100 tablet 2  ? Continuous Blood Gluc Sensor (FREESTYLE LIBRE 2 SENSOR) MISC Use as instructed to check blood sugar, change every 14 days 6 each 3  ? Continuous Blood Gluc Sensor (FREESTYLE LIBRE 2 SENSOR) MISC Use as directed every 14 days 6 each 3  ? Diclofenac Sodium (PENNSAID) 2 % SOLN Place 1 application onto the skin 2 (two) times daily. (Patient taking differently: Place 1 application. onto the skin 2 (two) times daily as needed (pain).) 112 g 2  ? divalproex (DEPAKOTE) 500 MG DR tablet Take 1 tablet (500 mg total) by mouth 2 (two) times daily. 60 tablet 5  ? fenofibrate 160 MG tablet Take 1 tablet (160 mg total) by mouth daily. 30 tablet 0  ? glucose blood (ACCU-CHEK AVIVA) test strip Use as directed to check blood sugar 4 times daily.  DX E11.9 (Patient taking differently: Use as directed to check blood sugar 4 times daily.  DX E11.9) 200 each 6  ? hydrOXYzine (ATARAX) 10 MG tablet Take 1 tablet (10 mg total) by mouth 3 (three) times daily as needed for anxiety or itching. 30 tablet 1  ? insulin glargine, 2 Unit Dial, (TOUJEO MAX SOLOSTAR) 300 UNIT/ML Solostar Pen Inject 20 Units into the skin every morning. And pen needles 1/day 30 mL 0  ? isosorbide mononitrate (IMDUR) 120 MG 24 hr tablet Take 1 tablet (120 mg total) by mouth daily. 90 tablet 3  ? levothyroxine (SYNTHROID) 100 MCG tablet TAKE 1 TABLET (100 MCG TOTAL) BY MOUTH DAILY. 90 tablet 1  ? nitroGLYCERIN (NITROSTAT) 0.4 MG SL tablet PLACE 1 TABLET UNDER THE TONGUE EVERY 5 MINUTES AS NEEDED FOR CHEST PAIN. 25 tablet 1  ? pantoprazole (PROTONIX) 40 MG tablet TAKE 1 TABLET BY MOUTH EVERY DAY 90 tablet 3  ? pramipexole (MIRAPEX) 1 MG tablet Take 1/2 to 1 tablet by mouth at bedtime 90 tablet 0  ? ranolazine (RANEXA) 500 MG 12 hr tablet TAKE 1 TABLET BY MOUTH TWICE DAILY 60 tablet 5  ? sertraline (ZOLOFT) 100 MG tablet TAKE 1 TABLET BY  MOUTH TWICE DAILY 180 tablet 0  ? triamcinolone cream (KENALOG) 0.1 % Apply 1 application topically 2 (two) times daily. Use as needed for itchy rash 30 g 1  ? Vitamin D, Ergocalciferol, (DRISDOL) 1.25 MG (50000 UNIT) CAPS capsule TAKE 1 CAPSULE BY MOUTH EVERY 7 DAYS 4 capsule 4  ? ?No facility-administered medications prior to visit.  ? ? ?PAST MEDICAL HISTORY: ?Past Medical History:  ?Diagnosis Date  ? Acquired atrophy of thyroid 06/05/2013  ? Overview:  July 2014: controlled on synthroid 98mg since 2012 May 2015: decreased to Synthroid 50 mcg  Last Assessment & Plan:  Pt doing well with most recent TSH within normal range.  Will continue current dosage of Synthroid 515m. Pt reminded to take medication  on empty stomach. Will continue to monitor with periodic laboratory assessment in 3 months.   ? Anemia   ? hemoglobin 7.4, iron deficiency, January, 2011, 2 unit transfusion, endoscopy normal, capsule endoscopy February, 2011 no small bowel abnormalities.   Most likely source gastric erosions, followed by GI  ? Anxiety   ? Bradycardia   ? CAD (coronary artery disease)   ? A. CABG in 2000,status post cardiac cath in 2006, 2009 ....continued chest pain and SOB despite oral medication adjestments including Ranexa. B. Cath November 2009/ mRCA - 2.75 x 23 Abbott Xience V drug-eluting stent ...11/26/2008 to distal  RCA leading to acute marginal.  C. Cath 07/2012 for CP - stable anatomy, med rx. d. cath 2015 and 05/30/2015 stable anatomy, consider Myoview if has CP again  ? Carotid artery disease ([place]) 08/01/2015  ? Doppler, May 29, 2015, 1-39% bilateral ICA   ? Cerebral ischemia   ? MRI November, 2010, chronic microvascular ischemia  ? CKD (chronic kidney disease), stage III ([place])   ? Concussion   ? Depression   ? Bipolar  ? Diabetes mellitus ([place])   ? Edema   ? Essential hypertension 06/05/2013  ? Overview:  July 2014: Controlled with Bystolic Sep 0321: Changed to atenolol.  Last Assessment & Plan:  Will change to  atenolol for cost and follow Improved.  Medication compliance strongly encouraged BP: 116/64 mmHg    Overview:  July 2014: Controlled with Bystolic Sep 2248: Changed to atenolol.  Last Assessment & Plan:  Wil

## 2022-03-01 NOTE — Patient Instructions (Signed)
Start with Aricept 10 mg nightly, half tablet nightly for 2 weeks then increase to full tab ?Continue other medication ?Follow-up in 1 year ? ? ?There are well-accepted and sensible ways to reduce risk for Alzheimers disease and other degenerative brain disorders . ? ?Exercise Daily Walk A daily 20 minute walk should be part of your routine. Disease related apathy can be a significant roadblock to exercise and the only way to overcome this is to make it a daily routine and perhaps have a reward at the end (something your loved one loves to eat or drink perhaps) or a personal trainer coming to the home can also be very useful. Most importantly, the patient is much more likely to exercise if the caregiver / spouse does it with him/her. In general a structured, repetitive schedule is best. ? ?General Health: Any diseases which effect your body will effect your brain such as a pneumonia, urinary infection, blood clot, heart attack or stroke. Keep contact with your primary care doctor for regular follow ups. ? ?Sleep. A good nights sleep is healthy for the brain. Seven hours is recommended. If you have insomnia or poor sleep habits we can give you some instructions. If you have sleep apnea wear your mask. ? ?Diet: Eating a heart healthy diet is also a good idea; fish and poultry instead of red meat, nuts (mostly non-peanuts), vegetables, fruits, olive oil or canola oil (instead of butter), minimal salt (use other spices to flavor foods), whole grain rice, bread, cereal and pasta and wine in moderation.Research is now showing that the MIND diet, which is a combination of The Mediterranean diet and the DASH diet, is beneficial for cognitive processing and longevity. Information about this diet can be found in The MIND Diet, a book by Doyne Keel, MS, RDN, and online at [employer]book[employer].si ? ?Finances, Power of Producer, television/film/video Directives: You should consider putting legal safeguards in place  with regard to financial and medical decision making. While the spouse always has power of attorney for medical and financial issues in the absence of any form, you should consider what you want in case the spouse / caregiver is no longer around or capable of making decisions.  ? ? ? ?Heart-head connection ? ?New research shows there are things we can do to reduce the risk of mild cognitive impairment and dementia. ? ?Several conditions known to increase the risk of cardiovascular disease -- such as high blood pressure, diabetes and high cholesterol -- also increase the risk of developing Alzheimer's. Some autopsy studies show that as many as 68 percent of individuals with Alzheimer's disease also have cardiovascular disease. ? ?A longstanding question is why some people develop hallmark Alzheimer's plaques and tangles but do not develop the symptoms of Alzheimer's. Vascular disease may help researchers eventually find an answer. Some autopsy studies suggest that plaques and tangles may be present in the brain without causing symptoms of cognitive decline unless the brain also shows evidence of vascular disease. More research is needed to better understand the link between vascular health and Alzheimer's. ? ?Physical exercise and diet ?Regular physical exercise may be a beneficial strategy to lower the risk of Alzheimer's and vascular dementia. Exercise may directly benefit brain cells by increasing blood and oxygen flow in the brain. Because of its known cardiovascular benefits, a medically approved exercise program is a valuable part of any overall wellness plan. ? ?Current evidence suggests that heart-healthy eating may also help protect the brain. Heart-healthy eating includes limiting  the intake of sugar and saturated fats and making sure to eat plenty of fruits, vegetables, and whole grains. No one diet is best. Two diets that have been studied and may be beneficial are the DASH (Dietary Approaches to Stop  Hypertension) diet and the Mediterranean diet. The DASH diet emphasizes vegetables, fruits and fat-free or low-fat dairy products; includes whole grains, fish, poultry, beans, seeds, nuts and vegetable oils; and limits sodium, sweets, sugary beverages and red meats. A Mediterranean diet includes relatively little red meat and emphasizes whole grains, fruits and vegetables, fish and shellfish, and nuts, olive oil and other healthy fats. ? ?Social connections and intellectual activity ?A number of studies indicate that maintaining strong social connections and keeping mentally active as we age might lower the risk of cognitive decline and Alzheimer's. Experts are not certain about the reason for this association. It may be due to direct mechanisms through which social and mental stimulation strengthen connections between nerve cells in the brain. ? ?Head trauma ?There appears to be a strong link between future risk of Alzheimer's and serious head trauma, especially when injury involves loss of consciousness. You can help reduce your risk of Alzheimer's by protecting your head. ? Wear a seat belt ? Use a helmet when participating in sports ? "Fall-proof" your home ?  ?What you can do now ?While research is not yet conclusive, certain lifestyle choices, such as physical activity and diet, may help support brain health and prevent Alzheimer's. Many of these lifestyle changes have been shown to lower the risk of other diseases, like heart disease and diabetes, which have been linked to Alzheimer's. With few drawbacks and plenty of known benefits, healthy lifestyle choices can improve your health and possibly protect your brain. ? ?Learn more about brain health. ?You can help increase our knowledge by considering participation in a clinical study. Our free clinical trial matching services, TrialMatch?, can help you find clinical trials in your area that are seeking volunteers. ? ? ? ?

## 2022-03-08 ENCOUNTER — Other Ambulatory Visit: Payer: Self-pay

## 2022-03-08 ENCOUNTER — Other Ambulatory Visit (HOSPITAL_BASED_OUTPATIENT_CLINIC_OR_DEPARTMENT_OTHER): Payer: Self-pay

## 2022-03-08 DIAGNOSIS — L309 Dermatitis, unspecified: Secondary | ICD-10-CM

## 2022-03-08 MED ORDER — HYDROCORTISONE 2.5 % EX CREA
TOPICAL_CREAM | Freq: Two times a day (BID) | CUTANEOUS | 1 refills | Status: DC
  Filled 2022-03-08: qty 30, 15d supply, fill #0
  Filled 2022-03-19 – 2022-03-22 (×2): qty 30, 15d supply, fill #1

## 2022-03-09 ENCOUNTER — Other Ambulatory Visit (HOSPITAL_BASED_OUTPATIENT_CLINIC_OR_DEPARTMENT_OTHER): Payer: Self-pay

## 2022-03-10 ENCOUNTER — Other Ambulatory Visit (HOSPITAL_BASED_OUTPATIENT_CLINIC_OR_DEPARTMENT_OTHER): Payer: Self-pay

## 2022-03-10 DIAGNOSIS — M1711 Unilateral primary osteoarthritis, right knee: Secondary | ICD-10-CM | POA: Diagnosis not present

## 2022-03-10 MED ORDER — TIZANIDINE HCL 4 MG PO TABS
4.0000 mg | ORAL_TABLET | Freq: Every evening | ORAL | 0 refills | Status: DC
  Filled 2022-03-10: qty 30, 30d supply, fill #0

## 2022-03-11 ENCOUNTER — Other Ambulatory Visit: Payer: Self-pay | Admitting: Family Medicine

## 2022-03-11 ENCOUNTER — Other Ambulatory Visit (HOSPITAL_BASED_OUTPATIENT_CLINIC_OR_DEPARTMENT_OTHER): Payer: Self-pay

## 2022-03-12 ENCOUNTER — Other Ambulatory Visit (HOSPITAL_BASED_OUTPATIENT_CLINIC_OR_DEPARTMENT_OTHER): Payer: Self-pay

## 2022-03-15 ENCOUNTER — Other Ambulatory Visit (HOSPITAL_BASED_OUTPATIENT_CLINIC_OR_DEPARTMENT_OTHER): Payer: Self-pay

## 2022-03-15 MED ORDER — MECLIZINE HCL 25 MG PO TABS
25.0000 mg | ORAL_TABLET | Freq: Three times a day (TID) | ORAL | 0 refills | Status: DC | PRN
  Filled 2022-03-15: qty 30, 10d supply, fill #0

## 2022-03-15 MED ORDER — HYDROXYZINE HCL 10 MG PO TABS
10.0000 mg | ORAL_TABLET | Freq: Three times a day (TID) | ORAL | 1 refills | Status: DC | PRN
  Filled 2022-03-15: qty 30, 10d supply, fill #0
  Filled 2022-03-30: qty 30, 10d supply, fill #1

## 2022-03-18 DIAGNOSIS — H25013 Cortical age-related cataract, bilateral: Secondary | ICD-10-CM | POA: Diagnosis not present

## 2022-03-18 DIAGNOSIS — H2513 Age-related nuclear cataract, bilateral: Secondary | ICD-10-CM | POA: Diagnosis not present

## 2022-03-18 DIAGNOSIS — E113293 Type 2 diabetes mellitus with mild nonproliferative diabetic retinopathy without macular edema, bilateral: Secondary | ICD-10-CM | POA: Diagnosis not present

## 2022-03-18 DIAGNOSIS — D3131 Benign neoplasm of right choroid: Secondary | ICD-10-CM | POA: Diagnosis not present

## 2022-03-18 DIAGNOSIS — Z794 Long term (current) use of insulin: Secondary | ICD-10-CM | POA: Diagnosis not present

## 2022-03-18 LAB — HM DIABETES EYE EXAM

## 2022-03-19 ENCOUNTER — Other Ambulatory Visit (HOSPITAL_BASED_OUTPATIENT_CLINIC_OR_DEPARTMENT_OTHER): Payer: Self-pay

## 2022-03-22 ENCOUNTER — Other Ambulatory Visit: Payer: Self-pay | Admitting: Endocrinology

## 2022-03-22 ENCOUNTER — Other Ambulatory Visit (HOSPITAL_COMMUNITY): Payer: Self-pay

## 2022-03-22 ENCOUNTER — Ambulatory Visit (INDEPENDENT_AMBULATORY_CARE_PROVIDER_SITE_OTHER): Payer: Medicare Other | Admitting: Endocrinology

## 2022-03-22 ENCOUNTER — Other Ambulatory Visit (HOSPITAL_BASED_OUTPATIENT_CLINIC_OR_DEPARTMENT_OTHER): Payer: Self-pay

## 2022-03-22 ENCOUNTER — Encounter: Payer: Self-pay | Admitting: Endocrinology

## 2022-03-22 VITALS — BP 142/64 | HR 87 | Ht 69.0 in | Wt 190.6 lb

## 2022-03-22 DIAGNOSIS — E1165 Type 2 diabetes mellitus with hyperglycemia: Secondary | ICD-10-CM

## 2022-03-22 LAB — POCT GLYCOSYLATED HEMOGLOBIN (HGB A1C): Hemoglobin A1C: 8.7 % — AB (ref 4.0–5.6)

## 2022-03-22 MED ORDER — NOVOLIN N FLEXPEN 100 UNIT/ML ~~LOC~~ SUPN
80.0000 [IU] | PEN_INJECTOR | SUBCUTANEOUS | 1 refills | Status: DC
  Filled 2022-03-22: qty 60, 75d supply, fill #0

## 2022-03-22 NOTE — Patient Instructions (Addendum)
check your blood sugar twice a day.  vary the time of day when you check, between before the 3 meals, and at bedtime.  also check if you have symptoms of your blood sugar being too high or too low.  please keep a record of the readings and bring it to your next appointment here (or you can bring the meter itself).  You can write it on any piece of paper.  please call us sooner if your blood sugar goes below 70, or if you have a lot of readings over 200.   ?Please change the Toujeo to NPH, 80 units each morning.   ?On this type of insulin schedule, you should eat meals on a regular schedule.  If a meal is missed or significantly delayed, your blood sugar could go low.   ?You should have an endocrinology follow-up appointment in 3 months.   ?

## 2022-03-22 NOTE — Telephone Encounter (Signed)
Pt insurance does not prefer Novolin N Flexpen. Plan prefers HUMULIN N Flexpen

## 2022-03-22 NOTE — Progress Notes (Signed)
? ?Subjective:  ? ? Patient ID: [name] [name], male    DOB: 17-Jan-1946, 76 y.o.   MRN: [id number] ? ?HPI ?Pt returns for f/u of diabetes mellitus:  ?DM type: Insulin-requiring type 2.  ?Dx'ed: 1989 ?Complications: PN, stage 3b CRI, CAD and PAD.   ?Therapy: insulin since 2008.   ?DKA: never ?Severe hypoglycemia: once (2022) ?Pancreatitis: never ?Pancreatic imaging: 2017 CT showed fatty replacement of the pancreatic head and body.  ?SDOH: he could not afford Trulicity.   ?Other: he takes QD insulin, after poor results with multiple daily injections; he uses FL continuous glucose monitor.    ?Interval history: I reviewed continuous glucose monitor data.  Glucose varies from 80-400.  It is in general highest at 12MN, and lowest at 9AM.  It decreases overnight, and increases throughout the day.  Toujeo was decreased at hosp d/c, but he re-increased to 90/d.   ?Past Medical History:  ?Diagnosis Date  ? Acquired atrophy of thyroid 06/05/2013  ? Overview:  July 2014: controlled on synthroid 6mg since 2012 May 2015: decreased to Synthroid 50 mcg  Last Assessment & Plan:  Pt doing well with most recent TSH within normal range.  Will continue current dosage of Synthroid 526m. Pt reminded to take medication on empty stomach. Will continue to monitor with periodic laboratory assessment in 3 months.   ? Anemia   ? hemoglobin 7.4, iron deficiency, January, 2011, 2 unit transfusion, endoscopy normal, capsule endoscopy February, 2011 no small bowel abnormalities.   Most likely source gastric erosions, followed by GI  ? Anxiety   ? Bradycardia   ? CAD (coronary artery disease)   ? A. CABG in 2000,status post cardiac cath in 2006, 2009 ....continued chest pain and SOB despite oral medication adjestments including Ranexa. B. Cath November 2009/ mRCA - 2.75 x 23 Abbott Xience V drug-eluting stent ...11/26/2008 to distal  RCA leading to acute marginal.  C. Cath 07/2012 for CP - stable anatomy, med rx. d. cath 2015 and 05/30/2015  stable anatomy, consider Myoview if has CP again  ? Carotid artery disease ([place]08/26/2016  ? Doppler, May 29, 2015, 1-39% bilateral ICA   ? Cerebral ischemia   ? MRI November, 2010, chronic microvascular ischemia  ? CKD (chronic [place] disease), stage III ([place]  ? Concussion   ? Depression   ? Bipolar  ? Diabetes mellitus ([facility]  ? Edema   ? Essential hypertension 06/05/2013  ? Overview:  July 2014: Controlled with Bystolic Sep [id number] to atenolol.  Last Assessment & Plan:  Will change to atenolol for cost and follow Improved.  Medication compliance strongly encouraged BP: 116/64 mmHg    Overview:  July 2014: Controlled with Bystolic Sep [id number] to atenolol.  Last Assessment & Plan:  Will change to atenolol for cost and follow  ? Falling episodes   ? these have occurred in the past and again recurring 2011  ? Family history of adverse reaction to anesthesia   ? "mother died during bypass surgery but not sure if it has to do with anesthesia"  ? Gastric ulcer   ? GERD (gastroesophageal reflux disease)   ? H/O medication noncompliance   ? Due to loss of insurance  ? H/O multiple concussions   ? Hard of hearing   ? Heart murmur   ? History of blood transfusion 12/20/2013  ? History of kidney stones   ? Hx of CABG   ? 2000,  / one median sternotomy suture broken  her chest x-ray November, 2010, no clinical significance  ? Hyperlipidemia   ? Hypertension   ? pt. denies  ? Iron deficiency anemia   ? Long term (current) use of anticoagulants [Z79.01] 07/20/2016  ? Low back pain 06/12/2009  ? Qualifier: Diagnosis of  By: [name]   ? Nephrolithiasis   ? Orthostasis   ? OSA (obstructive sleep apnea)   ? PAF (paroxysmal atrial fibrillation) ([place])   ? a. dx 2017, started on amiodarone/warfarin but patient intermittently noncompliant.  ? PSVT (paroxysmal supraventricular tachycardia) ([place])   ? RBBB 07/09/2009  ? Qualifier: Diagnosis of  By: [name], MD, Caffie Damme   ? RLS (restless legs syndrome)  09/19/2009  ? Qualifier: Diagnosis of  By: [name]   Overview:  July 2014: Controlled with Mirapex  Last Assessment & Plan:  Patient is doing well. Will continue current management and follow clinically.  ? Seizure disorder ([facility]) 01/09/2017  ? Spondylosis   ? C5-6, C6-7 MRI 2010  ? Syncope 03/2016  ? TBI (traumatic brain injury) ([place]) 2019  ? Thyroid disease   ? Tubulovillous adenoma of colon 2007  ? Vitamin D deficiency 05/17/2017  ? Wears glasses   ? ? ?Past Surgical History:  ?Procedure Laterality Date  ? ANTERIOR CERVICAL DECOMP/DISCECTOMY FUSION N/A 05/31/2016  ? Procedure: ANTERIOR CERVICAL DECOMPRESSION/DISCECTOMY FUSION CERVICAL FIVE-SIX,CERVICAL SIX-SEVEN;  Surgeon: [name], MD;  Location: [place] [facility];  Service: Neurosurgery;  Laterality: N/A;  ? CARDIAC CATHETERIZATION N/A 05/30/2015  ? Procedure: Left Heart Cath and Coronary Angiography;  Surgeon: [name], MD;  Location: [place] [facility];  Service: Cardiovascular;  Laterality: N/A;  ? COLONOSCOPY    ? CORONARY ARTERY BYPASS GRAFT    ? 2000  ? ESOPHAGOGASTRODUODENOSCOPY    ? LEFT HEART CATH AND CORS/GRAFTS ANGIOGRAPHY N/A 12/15/2017  ? Procedure: LEFT HEART CATH AND CORS/GRAFTS ANGIOGRAPHY;  Surgeon: [name], MD;  Location: [place] [facility];  Service: Cardiovascular;  Laterality: N/A;  ? LEFT HEART CATHETERIZATION WITH CORONARY/GRAFT ANGIOGRAM N/A 08/01/2012  ? Procedure: LEFT HEART CATHETERIZATION WITH Beatrix Fetters;  Surgeon: [name], MD;  [facility];  Service: Cardiovascular;  Laterality: N/A;  ? LEFT HEART CATHETERIZATION WITH CORONARY/GRAFT ANGIOGRAM N/A 01/03/2015  ? Procedure: LEFT HEART CATHETERIZATION WITH Beatrix Fetters;  Surgeon: [name], MD;  Location: [facility] CATH LAB;  Service: Cardiovascular;  Laterality: N/A;  ? NASAL SEPTUM SURGERY    ? UP3  ? PERCUTANEOUS CORONARY STENT INTERVENTION (PCI-S)  10/2008  ? mRCA PCI  2.75 x 23 Abbott Xience V drug-eluting stent   ?  RIGHT/LEFT HEART CATH AND CORONARY/GRAFT ANGIOGRAPHY N/A 01/07/2020  ? Procedure: RIGHT/LEFT HEART CATH AND CORONARY/GRAFT ANGIOGRAPHY;  Surgeon: [name], MD;  Location: [place] CV LAB;  Service: Cardiovascular;  Laterality: N/A;  ? TOTAL KNEE ARTHROPLASTY Right 09/22/2021  ? Procedure: RIGHT TOTAL KNEE ARTHROPLASTY;  Surgeon: [name], MD;  Location: [facility];  Service: Orthopedics;  Laterality: Right;  ? ULTRASOUND GUIDANCE FOR VASCULAR ACCESS  12/15/2017  ? Procedure: Ultrasound Guidance For Vascular Access;  Surgeon: [name], MD;  Location: [place] CV LAB;  Service: Cardiovascular;;  ? ? ?Social History  ? ?Socioeconomic History  ? Marital status: Married  ?  Spouse name: Not on file  ? Number of children: 4  ? Years of education: 45  ? Highest education level: Not on file  ?Occupational History  ? Occupation: retired  ?Tobacco  Use  ? Smoking status: Never  ?  Passive exposure: Never  ? Smokeless tobacco: Never  ?Vaping Use  ? Vaping Use: Never used  ?Substance and Sexual Activity  ? Alcohol use: No  ?  Alcohol/week: 0.0 standard drinks  ?  Comment: stopped drinking in 1998  ? Drug use: No  ? Sexual activity: Not Currently  ?  Partners: Female  ?Other Topics Concern  ? Not on file  ?Social History Narrative  ? Patient is right handed.  ? Patient does not drink caffeine.  ? ?Social Determinants of Health  ? ?Financial Resource Strain: Not on file  ?Food Insecurity: Not on file  ?Transportation Needs: Not on file  ?Physical Activity: Not on file  ?Stress: Not on file  ?Social Connections: Not on file  ?Intimate Partner Violence: Not on file  ? ? ?Current Outpatient Medications on File Prior to Visit  ?Medication Sig Dispense Refill  ? apixaban (ELIQUIS) 5 MG TABS tablet Take 1 tablet (5 mg total) by mouth 2 (two) times daily. 60 tablet 5  ? aspirin EC 81 MG tablet Take 81 mg by mouth daily. Swallow whole.    ? atorvastatin (LIPITOR) 80 MG tablet Take 1 tablet (80 mg total) by mouth  daily. 90 tablet 3  ? Continuous Blood Gluc Sensor (FREESTYLE LIBRE 2 SENSOR) MISC Use as instructed to check blood sugar, change every 14 days 6 each 3  ? Continuous Blood Gluc Sensor (FREESTYLE LIBRE 2 SENS

## 2022-03-23 ENCOUNTER — Other Ambulatory Visit (HOSPITAL_BASED_OUTPATIENT_CLINIC_OR_DEPARTMENT_OTHER): Payer: Self-pay

## 2022-03-24 ENCOUNTER — Other Ambulatory Visit (HOSPITAL_BASED_OUTPATIENT_CLINIC_OR_DEPARTMENT_OTHER): Payer: Self-pay

## 2022-03-24 ENCOUNTER — Other Ambulatory Visit: Payer: Self-pay

## 2022-03-24 DIAGNOSIS — E1165 Type 2 diabetes mellitus with hyperglycemia: Secondary | ICD-10-CM

## 2022-03-24 MED ORDER — HUMULIN N KWIKPEN 100 UNIT/ML ~~LOC~~ SUPN
80.0000 [IU] | PEN_INJECTOR | Freq: Every morning | SUBCUTANEOUS | 1 refills | Status: DC
  Filled 2022-03-24: qty 24, 30d supply, fill #0

## 2022-03-25 ENCOUNTER — Other Ambulatory Visit (HOSPITAL_BASED_OUTPATIENT_CLINIC_OR_DEPARTMENT_OTHER): Payer: Self-pay

## 2022-03-25 NOTE — Progress Notes (Unsigned)
Patient ID: [name] [name]                 DOB: 08/17/1946                      MRN: [id number] ? ? ? ? ?HPI: ?[name] [name] is a 76 y.o. male referred by [name] to HTN clinic. PMH is significant for CAD s/p CABG in 2000 and DES to RCA in 2009, paroxysmal atrial fibrillation, PSVT, CKD III, noncompliance, orthostatic hypotension, iron deficiency anemia, prior gastric erosions/ulcer, bipolar depression, DM (A1c 7.6%), HTN, HLD, kidney stones, OSA, RBBB, thyroid disease, and back pain. ? ?Hospitalized in March 2023 with angina/chest pain and found to have "unremarkable cardiac evaluation" and cleared by cardiology for discharge and outpatient follow up. During admission, metoprolol, amlodipine, isosorbide, and furosemide were discontinued due to bradycardia/hypotension. At 02/26/22 follow-up with [name], patient reported continuing medications as he was uncomfortable taking recommendations from hospital doctors. BP in clinic above goal 138/68. Measured BP at home, systolic mostly in 983J. Isosorbide mononitrate 120 mg daily was continued; Toprol 75 mg and amlodipine 5 mg was stopped for dizziness. ? ?Patient presents to PharmD HTN clinic today. ***Denies dizziness, balance, lightheadedness, headache, blurred vision, edema. ? ?Dizziness ?Home BP log, cuff? ?Modifiable RF's  ?Diet ?Exercise ?Options ?Add back amlodipine if needed ?Toprol if palpitations/ room on HR ? ?Current HTN meds: none ? ?Previously tried: Toprol 75 mg daily, amlodipine 5 mg daily (discontinued during March 2023 but admission for bradycardia/hypotension but patient continued; stopped at 02/26/21 clinic visit with Irish Lack) ? ?BP goal: <130/80 ? ?Family History: Cancer in his maternal grandmother; Coronary artery disease in his brother and brother; Diabetes in his brother, brother, and mother; Heart attack in an other family member; Heart failure in his father and mother; Hypothyroidism in his brother; Irregular heart beat in his  daughter; Other in his brother; Pancreatic cancer in his brother; Stroke in his brother.  ? ?Social History: Denies tobacco, alcohol, or illicit drug use ? ?Diet: *** ? ?Exercise: *** ? ?Home BP readings: *** ? ?Wt Readings from Last 3 Encounters:  ?03/22/22 190 lb 9.6 oz (86.5 kg)  ?03/01/22 185 lb (83.9 kg)  ?02/26/22 187 lb 3.2 oz (84.9 kg)  ? ?BP Readings from Last 3 Encounters:  ?03/22/22 (!) 142/64  ?03/01/22 (!) 132/43  ?02/26/22 138/68  ? ?Pulse Readings from Last 3 Encounters:  ?03/22/22 87  ?03/01/22 (!) 57  ?02/26/22 74  ? ? ?Renal function: ?CrCl cannot be calculated (Patient's most recent lab result is older than the maximum 21 days allowed.). ? ?Past Medical History:  ?Diagnosis Date  ? Acquired atrophy of thyroid 06/05/2013  ? Overview:  July 2014: controlled on synthroid 76mg since 2012 May 2015: decreased to Synthroid 50 mcg  Last Assessment & Plan:  Pt doing well with most recent TSH within normal range.  Will continue current dosage of Synthroid 539m. Pt reminded to take medication on empty stomach. Will continue to monitor with periodic laboratory assessment in 3 months.   ? Anemia   ? hemoglobin 7.4, iron deficiency, January, 2011, 2 unit transfusion, endoscopy normal, capsule endoscopy February, 2011 no small bowel abnormalities.   Most likely source gastric erosions, followed by GI  ? Anxiety   ? Bradycardia   ? CAD (coronary artery disease)   ? A. CABG in 2000,status post cardiac cath in 2006, 2009 ....continued chest pain and SOB despite oral medication adjestments including Ranexa. B. Cath  November 2009/ mRCA - 2.75 x 23 Abbott Xience V drug-eluting stent ...11/26/2008 to distal  RCA leading to acute marginal.  C. Cath 07/2012 for CP - stable anatomy, med rx. d. cath 2015 and 05/30/2015 stable anatomy, consider Myoview if has CP again  ? Carotid artery disease ([facility]) 08/01/2015  ? Doppler, May 29, 2015, 1-39% bilateral ICA   ? Cerebral ischemia   ? MRI November, 2010, chronic microvascular  ischemia  ? CKD (chronic kidney disease), stage III ([place])   ? Concussion   ? Depression   ? Bipolar  ? Diabetes mellitus ([place])   ? Edema   ? Essential hypertension 06/05/2013  ? Overview:  July 2014: Controlled with Bystolic Sep 4818: Changed to atenolol.  Last Assessment & Plan:  Will change to atenolol for cost and follow Improved.  Medication compliance strongly encouraged BP: 116/64 mmHg    Overview:  July 2014: Controlled with Bystolic Sep 5631: Changed to atenolol.  Last Assessment & Plan:  Will change to atenolol for cost and follow  ? Falling episodes   ? these have occurred in the past and again recurring 2011  ? Family history of adverse reaction to anesthesia   ? "mother died during bypass surgery but not sure if it has to do with anesthesia"  ? Gastric ulcer   ? GERD (gastroesophageal reflux disease)   ? H/O medication noncompliance   ? Due to loss of insurance  ? H/O multiple concussions   ? Hard of hearing   ? Heart murmur   ? History of blood transfusion 12/20/2013  ? History of kidney stones   ? Hx of CABG   ? 2000,  / one median sternotomy suture broken her chest x-ray November, 2010, no clinical significance  ? Hyperlipidemia   ? Hypertension   ? pt. denies  ? Iron deficiency anemia   ? Long term (current) use of anticoagulants [Z79.01] 07/20/2016  ? Low back pain 06/12/2009  ? Qualifier: Diagnosis of  By: [name]   ? Nephrolithiasis   ? Orthostasis   ? OSA (obstructive sleep apnea)   ? PAF (paroxysmal atrial fibrillation) ([place])   ? a. dx 2017, started on amiodarone/warfarin but patient intermittently noncompliant.  ? PSVT (paroxysmal supraventricular tachycardia) ([place])   ? RBBB 07/09/2009  ? Qualifier: Diagnosis of  By: [name], MD, Caffie Damme   ? RLS (restless legs syndrome) 09/19/2009  ? Qualifier: Diagnosis of  By: [name]   Overview:  July 2014: Controlled with Mirapex  Last Assessment & Plan:  Patient is doing well. Will continue current management and follow  clinically.  ? Seizure disorder ([place]) 01/09/2017  ? Spondylosis   ? C5-6, C6-7 MRI 2010  ? Syncope 03/2016  ? TBI (traumatic brain injury) ([place]) 2019  ? Thyroid disease   ? Tubulovillous adenoma of colon 2007  ? Vitamin D deficiency 05/17/2017  ? Wears glasses   ? ? ?Current Outpatient Medications on File Prior to Visit  ?Medication Sig Dispense Refill  ? apixaban (ELIQUIS) 5 MG TABS tablet Take 1 tablet (5 mg total) by mouth 2 (two) times daily. 60 tablet 5  ? aspirin EC 81 MG tablet Take 81 mg by mouth daily. Swallow whole.    ? atorvastatin (LIPITOR) 80 MG tablet Take 1 tablet (80 mg total) by mouth daily. 90 tablet 3  ? cetirizine (ZYRTEC) 10 MG tablet TAKE 1 TABLET (10 MG TOTAL) BY MOUTH DAILY. 100 tablet 2  ?  Continuous Blood Gluc Sensor (FREESTYLE LIBRE 2 SENSOR) MISC Use as instructed to check blood sugar, change every 14 days 6 each 3  ? Continuous Blood Gluc Sensor (FREESTYLE LIBRE 2 SENSOR) MISC Use as directed every 14 days 6 each 3  ? Diclofenac Sodium (PENNSAID) 2 % SOLN Place 1 application onto the skin 2 (two) times daily. (Patient taking differently: Place 1 application. onto the skin 2 (two) times daily as needed (pain).) 112 g 2  ? divalproex (DEPAKOTE) 500 MG DR tablet Take 1 tablet (500 mg total) by mouth 2 (two) times daily. 60 tablet 5  ? donepezil (ARICEPT) 10 MG tablet Take 1 tablet (10 mg total) by mouth at bedtime. 90 tablet 4  ? fenofibrate 160 MG tablet Take 1 tablet (160 mg total) by mouth daily. 30 tablet 0  ? glucose blood (ACCU-CHEK AVIVA) test strip Use as directed to check blood sugar 4 times daily.  DX E11.9 (Patient taking differently: Use as directed to check blood sugar 4 times daily.  DX E11.9) 200 each 6  ? hydrocortisone 2.5 % cream Apply on to the skin 2 (two) times daily. 30 g 1  ? hydrOXYzine (ATARAX) 10 MG tablet Take 1 tablet (10 mg total) by mouth 3 (three) times daily as needed for anxiety or itching. 30 tablet 1  ? Insulin NPH, Human,, Isophane, (HUMULIN N KWIKPEN)  100 UNIT/ML Kiwkpen Inject 80 Units into the skin in the morning. 90 mL 1  ? isosorbide mononitrate (IMDUR) 120 MG 24 hr tablet Take 1 tablet (120 mg total) by mouth daily. 90 tablet 3  ? levothyroxine (SYNTHROID

## 2022-03-26 ENCOUNTER — Other Ambulatory Visit (HOSPITAL_BASED_OUTPATIENT_CLINIC_OR_DEPARTMENT_OTHER): Payer: Self-pay

## 2022-03-26 ENCOUNTER — Ambulatory Visit: Payer: Medicare Other | Admitting: Pharmacist

## 2022-03-29 ENCOUNTER — Other Ambulatory Visit (HOSPITAL_BASED_OUTPATIENT_CLINIC_OR_DEPARTMENT_OTHER): Payer: Self-pay

## 2022-03-30 ENCOUNTER — Other Ambulatory Visit (HOSPITAL_BASED_OUTPATIENT_CLINIC_OR_DEPARTMENT_OTHER): Payer: Self-pay

## 2022-03-30 ENCOUNTER — Other Ambulatory Visit: Payer: Self-pay | Admitting: Family Medicine

## 2022-03-30 ENCOUNTER — Ambulatory Visit: Payer: Medicare Other | Admitting: Endocrinology

## 2022-03-30 DIAGNOSIS — G2581 Restless legs syndrome: Secondary | ICD-10-CM

## 2022-03-30 MED ORDER — MECLIZINE HCL 25 MG PO TABS
25.0000 mg | ORAL_TABLET | Freq: Three times a day (TID) | ORAL | 0 refills | Status: DC | PRN
  Filled 2022-03-30: qty 30, 10d supply, fill #0

## 2022-03-30 MED ORDER — PRAMIPEXOLE DIHYDROCHLORIDE 1 MG PO TABS
1.0000 mg | ORAL_TABLET | Freq: Every day | ORAL | 0 refills | Status: DC
  Filled 2022-03-30: qty 90, 90d supply, fill #0

## 2022-03-31 ENCOUNTER — Other Ambulatory Visit (HOSPITAL_BASED_OUTPATIENT_CLINIC_OR_DEPARTMENT_OTHER): Payer: Self-pay

## 2022-03-31 ENCOUNTER — Other Ambulatory Visit: Payer: Self-pay | Admitting: Family Medicine

## 2022-04-01 ENCOUNTER — Other Ambulatory Visit (HOSPITAL_BASED_OUTPATIENT_CLINIC_OR_DEPARTMENT_OTHER): Payer: Self-pay

## 2022-04-01 MED ORDER — ATORVASTATIN CALCIUM 80 MG PO TABS
80.0000 mg | ORAL_TABLET | Freq: Every day | ORAL | 0 refills | Status: DC
  Filled 2022-04-01: qty 90, 90d supply, fill #0

## 2022-04-02 ENCOUNTER — Other Ambulatory Visit (HOSPITAL_BASED_OUTPATIENT_CLINIC_OR_DEPARTMENT_OTHER): Payer: Self-pay

## 2022-04-02 ENCOUNTER — Other Ambulatory Visit: Payer: Self-pay | Admitting: Family Medicine

## 2022-04-02 MED ORDER — NITROGLYCERIN 0.4 MG SL SUBL
SUBLINGUAL_TABLET | SUBLINGUAL | 1 refills | Status: DC
  Filled 2022-04-02: qty 25, 5d supply, fill #0
  Filled 2022-04-29: qty 25, 5d supply, fill #1

## 2022-04-06 ENCOUNTER — Other Ambulatory Visit (HOSPITAL_BASED_OUTPATIENT_CLINIC_OR_DEPARTMENT_OTHER): Payer: Self-pay

## 2022-04-08 ENCOUNTER — Other Ambulatory Visit (HOSPITAL_BASED_OUTPATIENT_CLINIC_OR_DEPARTMENT_OTHER): Payer: Self-pay

## 2022-04-12 NOTE — Progress Notes (Signed)
?Name: [name] [name]  ?Age/ Sex: 76 y.o., male   ?MRN/ DOB: [id number], 04-Nov-1946    ? ?PCP: [name], MD   ?Reason for Endocrinology Evaluation: Type 2 Diabetes Mellitus  ?Initial Endocrine Consultative Visit: 11/21/2017  ? ? ?PATIENT IDENTIFIER: [name] [name] is a 76 y.o. male with a past medical history of T2DM, HTN, CAD,Hypothyroidism CHF and OSA. The patient has followed with Endocrinology clinic since 11/21/2017 for consultative assistance with management of his diabetes. ? ? ?On his initia visit with me 04/2022 he was on NPH only with an A1c 8.7 % . He declined SGLT-2i and opted for prandial insulin . We restarted toujeo and stopped NPH ? ?DIABETIC HISTORY:  ?[name] was diagnosed with DM in 1989.He was on Metformin at some point.  His hemoglobin A1c has ranged from 6.4% in 2022, peaking at 8.7% in 2023. ? ? ?SUBJECTIVE:  ? ?During the last visit (03/22/2022): A1c  8.7%, saw Dr. Loanne Drilling  ? ?Today (04/13/2022): [name] [name] is here for follow-up on diabetes management. He is accompanied by his daughter today  He checks his blood sugars multiple  times daily through CGM. The patient has had hypoglycemic episodes since the last clinic visit, but he did not recall this .  ? ?Denies nausea, vomiting or diarrhea  ? ? ?HOME DIABETES REGIMEN:  ?NPH 80 units daily  ? ? ? ? ? ?Statin: yes ?ACE-I/ARB: no ?  ? ? ? ?CONTINUOUS GLUCOSE MONITORING RECORD INTERPRETATION   ? ?Dates of Recording: 4/26-04/13/2022 ? ?Sensor description:freestyle libre 2 ? ?Results statistics: ?  ?CGM use % of time 96  ?Average and SD 246/34.4  ?Time in range   23     %  ?% Time Above 180 29  ?% Time above 250 48  ?% Time Below target 0  ? ?Glycemic patterns summary: Hyperglycemia noted during the day and night  ? ?Hyperglycemic episodes  postprandial ? ?Hypoglycemic episodes occurred yes morning  ? ?Overnight periods: trends down  ? ? ? ? ? ? ? ?DIABETIC COMPLICATIONS: ?Microvascular complications:  ?Neuropathy, CKD  III ?Denies:  ?Last Eye Exam: Completed  ? ?Macrovascular complications:  ?CAD, PVD ?Denies: CVA ? ? ?HISTORY:  ?Past Medical History:  ?Past Medical History:  ?Diagnosis Date  ? Acquired atrophy of thyroid 06/05/2013  ? Overview:  July 2014: controlled on synthroid 40mg since 2012 May 2015: decreased to Synthroid 50 mcg  Last Assessment & Plan:  Pt doing well with most recent TSH within normal range.  Will continue current dosage of Synthroid 550m. Pt reminded to take medication on empty stomach. Will continue to monitor with periodic laboratory assessment in 3 months.   ? Anemia   ? hemoglobin 7.4, iron deficiency, January, 2011, 2 unit transfusion, endoscopy normal, capsule endoscopy February, 2011 no small bowel abnormalities.   Most likely source gastric erosions, followed by GI  ? Anxiety   ? Bradycardia   ? CAD (coronary artery disease)   ? A. CABG in 2000,status post cardiac cath in 2006, 2009 ....continued chest pain and SOB despite oral medication adjestments including Ranexa. B. Cath November 2009/ mRCA - 2.75 x 23 Abbott Xience V drug-eluting stent ...11/26/2008 to distal  RCA leading to acute marginal.  C. Cath 07/2012 for CP - stable anatomy, med rx. d. cath 2015 and 05/30/2015 stable anatomy, consider Myoview if has CP again  ? Carotid artery disease ([place]  ? Doppler, May 29, 2015, 1-39% bilateral ICA   ? Cerebral  ischemia   ? MRI November, 2010, chronic microvascular ischemia  ? CKD (chronic kidney disease), stage III ([place])   ? Concussion   ? Depression   ? Bipolar  ? Diabetes mellitus ([place])   ? Edema   ? Essential hypertension 06/05/2013  ? Overview:  July 2014: Controlled with Bystolic Sep 7616: Changed to atenolol.  Last Assessment & Plan:  Will change to atenolol for cost and follow Improved.  Medication compliance strongly encouraged BP: 116/64 mmHg    Overview:  July 2014: Controlled with Bystolic Sep 0737: Changed to atenolol.  Last Assessment & Plan:  Will change to atenolol for  cost and follow  ? Falling episodes   ? these have occurred in the past and again recurring 2011  ? Family history of adverse reaction to anesthesia   ? "mother died during bypass surgery but not sure if it has to do with anesthesia"  ? Gastric ulcer   ? GERD (gastroesophageal reflux disease)   ? H/O medication noncompliance   ? Due to loss of insurance  ? H/O multiple concussions   ? Hard of hearing   ? Heart murmur   ? History of blood transfusion 12/20/2013  ? History of kidney stones   ? Hx of CABG   ? 2000,  / one median sternotomy suture broken her chest x-ray November, 2010, no clinical significance  ? Hyperlipidemia   ? Hypertension   ? pt. denies  ? Iron deficiency anemia   ? Long term (current) use of anticoagulants [Z79.01] 07/20/2016  ? Low back pain 06/12/2009  ? Qualifier: Diagnosis of  By: [name]   ? Nephrolithiasis   ? Orthostasis   ? OSA (obstructive sleep apnea)   ? PAF (paroxysmal atrial fibrillation) ([place])   ? a. dx 2017, started on amiodarone/warfarin but patient intermittently noncompliant.  ? PSVT (paroxysmal supraventricular tachycardia) ([place])   ? RBBB 07/09/2009  ? Qualifier: Diagnosis of  By: [name], MD, Caffie Damme   ? RLS (restless legs syndrome) 09/19/2009  ? Qualifier: Diagnosis of  By: [name]   Overview:  July 2014: Controlled with Mirapex  Last Assessment & Plan:  Patient is doing well. Will continue current management and follow clinically.  ? Seizure disorder ([place]) 01/09/2017  ? Spondylosis   ? C5-6, C6-7 MRI 2010  ? Syncope 03/2016  ? TBI (traumatic brain injury) ([place]) 2019  ? Thyroid disease   ? Tubulovillous adenoma of colon 2007  ? Vitamin D deficiency 05/17/2017  ? Wears glasses   ? ?Past Surgical History:  ?Past Surgical History:  ?Procedure Laterality Date  ? ANTERIOR CERVICAL DECOMP/DISCECTOMY FUSION N/A 05/31/2016  ? Procedure: ANTERIOR CERVICAL DECOMPRESSION/DISCECTOMY FUSION CERVICAL FIVE-SIX,CERVICAL SIX-SEVEN;  Surgeon: [name], MD;   Location: [facility];  Service: Neurosurgery;  Laterality: N/A;  ? CARDIAC CATHETERIZATION N/A 05/30/2015  ? Procedure: Left Heart Cath and Coronary Angiography;  Surgeon: [name], MD;  Location: [facility];  Service: Cardiovascular;  Laterality: N/A;  ? COLONOSCOPY    ? CORONARY ARTERY BYPASS GRAFT    ? 2000  ? ESOPHAGOGASTRODUODENOSCOPY    ? LEFT HEART CATH AND CORS/GRAFTS ANGIOGRAPHY N/A 12/15/2017  ? Procedure: LEFT HEART CATH AND CORS/GRAFTS ANGIOGRAPHY;  Surgeon: [name], MD;  Location: [facility];  Service: Cardiovascular;  Laterality: N/A;  ? LEFT HEART CATHETERIZATION WITH CORONARY/GRAFT ANGIOGRAM N/A 08/01/2012  ? Procedure: LEFT HEART CATHETERIZATION WITH Beatrix Fetters;  Surgeon: Loretha Brasil  Lia Foyer, MD;  Location: [facility];  Service: Cardiovascular;  Laterality: N/A;  ? LEFT HEART CATHETERIZATION WITH CORONARY/GRAFT ANGIOGRAM N/A 01/03/2015  ? Procedure: LEFT HEART CATHETERIZATION WITH Beatrix Fetters;  Surgeon: [name], MD;  Location: [facility] CATH LAB;  Service: Cardiovascular;  Laterality: N/A;  ? NASAL SEPTUM SURGERY    ? UP3  ? PERCUTANEOUS CORONARY STENT INTERVENTION (PCI-S)  10/2008  ? mRCA PCI  2.75 x 23 Abbott Xience V drug-eluting stent   ? RIGHT/LEFT HEART CATH AND CORONARY/GRAFT ANGIOGRAPHY N/A 01/07/2020  ? Procedure: RIGHT/LEFT HEART CATH AND CORONARY/GRAFT ANGIOGRAPHY;  Surgeon: [name], MD;  Location: [facility];  Service: Cardiovascular;  Laterality: N/A;  ? TOTAL KNEE ARTHROPLASTY Right 09/22/2021  ? Procedure: RIGHT TOTAL KNEE ARTHROPLASTY;  Surgeon: [name], MD;  Location: [facility];  Service: Orthopedics;  Laterality: Right;  ? ULTRASOUND GUIDANCE FOR VASCULAR ACCESS  12/15/2017  ? Procedure: Ultrasound Guidance For Vascular Access;  Surgeon: [name], MD;  Location: [facility];  Service: Cardiovascular;;  ? ?Social History:  reports that he has never smoked. He has never been exposed to  tobacco smoke. He has never used smokeless tobacco. He reports that he does not drink alcohol and does not use drugs. ?Family History:  ?Family History  ?Problem Relation Age of Onset  ? Pancreatic cancer Brother   ? D

## 2022-04-13 ENCOUNTER — Other Ambulatory Visit: Payer: Self-pay | Admitting: Family Medicine

## 2022-04-13 ENCOUNTER — Other Ambulatory Visit (HOSPITAL_BASED_OUTPATIENT_CLINIC_OR_DEPARTMENT_OTHER): Payer: Self-pay

## 2022-04-13 ENCOUNTER — Encounter: Payer: Self-pay | Admitting: Internal Medicine

## 2022-04-13 ENCOUNTER — Ambulatory Visit: Payer: Medicare Other | Admitting: Internal Medicine

## 2022-04-13 VITALS — BP 122/70 | HR 65 | Ht 69.0 in | Wt 192.6 lb

## 2022-04-13 DIAGNOSIS — E1122 Type 2 diabetes mellitus with diabetic chronic kidney disease: Secondary | ICD-10-CM | POA: Insufficient documentation

## 2022-04-13 DIAGNOSIS — E1165 Type 2 diabetes mellitus with hyperglycemia: Secondary | ICD-10-CM | POA: Diagnosis not present

## 2022-04-13 DIAGNOSIS — E1159 Type 2 diabetes mellitus with other circulatory complications: Secondary | ICD-10-CM

## 2022-04-13 DIAGNOSIS — Z794 Long term (current) use of insulin: Secondary | ICD-10-CM | POA: Insufficient documentation

## 2022-04-13 DIAGNOSIS — E119 Type 2 diabetes mellitus without complications: Secondary | ICD-10-CM | POA: Insufficient documentation

## 2022-04-13 DIAGNOSIS — N1831 Chronic kidney disease, stage 3a: Secondary | ICD-10-CM | POA: Diagnosis not present

## 2022-04-13 DIAGNOSIS — E1142 Type 2 diabetes mellitus with diabetic polyneuropathy: Secondary | ICD-10-CM

## 2022-04-13 MED ORDER — INSULIN PEN NEEDLE 31G X 5 MM MISC
1.0000 | Freq: Four times a day (QID) | 3 refills | Status: DC
  Filled 2022-04-13: qty 400, 100d supply, fill #0

## 2022-04-13 MED ORDER — FREESTYLE LIBRE 2 SENSOR MISC
1.0000 | 3 refills | Status: DC
  Filled 2022-04-13 – 2022-04-27 (×3): qty 6, 84d supply, fill #0
  Filled 2022-07-05: qty 6, 84d supply, fill #1
  Filled 2022-09-06: qty 6, 84d supply, fill #2
  Filled 2022-11-22: qty 6, 84d supply, fill #3

## 2022-04-13 MED ORDER — TOUJEO MAX SOLOSTAR 300 UNIT/ML ~~LOC~~ SOPN
65.0000 [IU] | PEN_INJECTOR | Freq: Every day | SUBCUTANEOUS | 2 refills | Status: DC
  Filled 2022-04-13: qty 30, 138d supply, fill #0
  Filled 2022-06-04: qty 18, 83d supply, fill #0
  Filled 2022-06-24: qty 30, 138d supply, fill #0
  Filled 2022-07-21: qty 18, 83d supply, fill #0
  Filled 2022-07-21: qty 30, 138d supply, fill #0
  Filled 2022-10-07: qty 18, 83d supply, fill #1
  Filled 2023-01-06 – 2023-02-07 (×3): qty 18, 83d supply, fill #2
  Filled 2023-02-23: qty 6, 27d supply, fill #2

## 2022-04-13 MED ORDER — LEVOTHYROXINE SODIUM 100 MCG PO TABS
ORAL_TABLET | Freq: Every day | ORAL | 1 refills | Status: DC
  Filled 2022-04-13: qty 90, 90d supply, fill #0
  Filled 2022-07-21: qty 90, 90d supply, fill #1

## 2022-04-13 MED ORDER — HYDROXYZINE HCL 10 MG PO TABS
10.0000 mg | ORAL_TABLET | Freq: Three times a day (TID) | ORAL | 1 refills | Status: DC | PRN
  Filled 2022-04-13: qty 30, 10d supply, fill #0
  Filled 2022-04-23: qty 30, 10d supply, fill #1

## 2022-04-13 MED ORDER — INSULIN LISPRO (1 UNIT DIAL) 100 UNIT/ML (KWIKPEN)
10.0000 [IU] | PEN_INJECTOR | Freq: Three times a day (TID) | SUBCUTANEOUS | 3 refills | Status: DC
  Filled 2022-04-13: qty 30, 100d supply, fill #0
  Filled 2022-07-21: qty 30, 100d supply, fill #1
  Filled 2023-01-06 – 2023-02-07 (×3): qty 30, 100d supply, fill #2
  Filled 2023-02-23: qty 9, 30d supply, fill #2

## 2022-04-13 NOTE — Patient Instructions (Addendum)
-   Stop Humulin -N (NPH) ?- Restart Toujeo 65 units ONCE daily  ?- START Humalog 10 units with each meal ( breakfast, lunch and supper) ?-Humalog correctional insulin: ADD extra units on insulin to your meal-time Humalog dose if your blood sugars are higher than 150. Use the scale below to help guide you:  ? ?Blood sugar before meal Number of units to inject  ?Less than 155 0 unit  ?156 - 180 1 units  ?181 -  205 2 units  ?206 -  230 3 units  ?231 -  255 4 units  ?256 -  280 5 units  ?281 -  305 6 units  ?306 -  330 7 units  ?331 -  355 8 units  ?356 - 380 9 units   ? ?HOW TO TREAT LOW BLOOD SUGARS (Blood sugar LESS THAN 70 MG/DL) ?Please follow the RULE OF 15 for the treatment of hypoglycemia treatment (when your (blood sugars are less than 70 mg/dL)  ? ?STEP 1: Take 15 grams of carbohydrates when your blood sugar is low, which includes:  ?3-4 GLUCOSE TABS  OR ?3-4 OZ OF JUICE OR REGULAR SODA OR ?ONE TUBE OF GLUCOSE GEL   ? ?STEP 2: RECHECK blood sugar in 15 MINUTES ?STEP 3: If your blood sugar is still low at the 15 minute recheck --> then, go back to STEP 1 and treat AGAIN with another 15 grams of carbohydrates. ? ?

## 2022-04-20 ENCOUNTER — Other Ambulatory Visit (HOSPITAL_BASED_OUTPATIENT_CLINIC_OR_DEPARTMENT_OTHER): Payer: Self-pay

## 2022-04-20 ENCOUNTER — Other Ambulatory Visit: Payer: Self-pay | Admitting: Family Medicine

## 2022-04-21 ENCOUNTER — Other Ambulatory Visit (HOSPITAL_BASED_OUTPATIENT_CLINIC_OR_DEPARTMENT_OTHER): Payer: Self-pay

## 2022-04-21 MED ORDER — MECLIZINE HCL 25 MG PO TABS
25.0000 mg | ORAL_TABLET | Freq: Three times a day (TID) | ORAL | 0 refills | Status: DC | PRN
  Filled 2022-04-21: qty 30, 10d supply, fill #0

## 2022-04-23 ENCOUNTER — Other Ambulatory Visit: Payer: Self-pay | Admitting: Interventional Cardiology

## 2022-04-23 ENCOUNTER — Other Ambulatory Visit (HOSPITAL_BASED_OUTPATIENT_CLINIC_OR_DEPARTMENT_OTHER): Payer: Self-pay

## 2022-04-26 ENCOUNTER — Other Ambulatory Visit (HOSPITAL_BASED_OUTPATIENT_CLINIC_OR_DEPARTMENT_OTHER): Payer: Self-pay

## 2022-04-26 DIAGNOSIS — H43811 Vitreous degeneration, right eye: Secondary | ICD-10-CM | POA: Diagnosis not present

## 2022-04-27 ENCOUNTER — Encounter: Payer: Self-pay | Admitting: Family Medicine

## 2022-04-27 ENCOUNTER — Other Ambulatory Visit (HOSPITAL_BASED_OUTPATIENT_CLINIC_OR_DEPARTMENT_OTHER): Payer: Self-pay

## 2022-04-27 ENCOUNTER — Ambulatory Visit (INDEPENDENT_AMBULATORY_CARE_PROVIDER_SITE_OTHER): Payer: Medicare Other | Admitting: Family Medicine

## 2022-04-27 VITALS — BP 124/78 | HR 71 | Resp 20 | Ht 69.0 in | Wt 192.0 lb

## 2022-04-27 DIAGNOSIS — E785 Hyperlipidemia, unspecified: Secondary | ICD-10-CM

## 2022-04-27 DIAGNOSIS — F418 Other specified anxiety disorders: Secondary | ICD-10-CM

## 2022-04-27 DIAGNOSIS — E559 Vitamin D deficiency, unspecified: Secondary | ICD-10-CM

## 2022-04-27 DIAGNOSIS — N184 Chronic kidney disease, stage 4 (severe): Secondary | ICD-10-CM

## 2022-04-27 DIAGNOSIS — N1831 Chronic kidney disease, stage 3a: Secondary | ICD-10-CM

## 2022-04-27 DIAGNOSIS — I10 Essential (primary) hypertension: Secondary | ICD-10-CM

## 2022-04-27 DIAGNOSIS — R700 Elevated erythrocyte sedimentation rate: Secondary | ICD-10-CM | POA: Diagnosis not present

## 2022-04-27 DIAGNOSIS — E1122 Type 2 diabetes mellitus with diabetic chronic kidney disease: Secondary | ICD-10-CM | POA: Diagnosis not present

## 2022-04-27 DIAGNOSIS — L309 Dermatitis, unspecified: Secondary | ICD-10-CM

## 2022-04-27 DIAGNOSIS — Z794 Long term (current) use of insulin: Secondary | ICD-10-CM | POA: Diagnosis not present

## 2022-04-27 LAB — COMPREHENSIVE METABOLIC PANEL
ALT: 17 U/L (ref 0–53)
AST: 20 U/L (ref 0–37)
Albumin: 4.3 g/dL (ref 3.5–5.2)
Alkaline Phosphatase: 91 U/L (ref 39–117)
BUN: 15 mg/dL (ref 6–23)
CO2: 28 mEq/L (ref 19–32)
Calcium: 9.4 mg/dL (ref 8.4–10.5)
Chloride: 103 mEq/L (ref 96–112)
Creatinine, Ser: 1.27 mg/dL (ref 0.40–1.50)
GFR: 56 mL/min — ABNORMAL LOW (ref >60.00)
Glucose, Bld: 163 mg/dL — ABNORMAL HIGH (ref 70–99)
Potassium: 4.4 mEq/L (ref 3.5–5.1)
Sodium: 138 mEq/L (ref 135–145)
Total Bilirubin: 0.6 mg/dL (ref 0.2–1.2)
Total Protein: 6.6 g/dL (ref 6.0–8.3)

## 2022-04-27 LAB — VITAMIN D 25 HYDROXY (VIT D DEFICIENCY, FRACTURES): VITD: 34.19 ng/mL (ref 30.00–100.00)

## 2022-04-27 LAB — SEDIMENTATION RATE: Sed Rate: 5 mm/hr (ref 0–20)

## 2022-04-27 MED ORDER — PERMETHRIN 5 % EX CREA
TOPICAL_CREAM | CUTANEOUS | 1 refills | Status: DC
  Filled 2022-04-27: qty 60, 7d supply, fill #0

## 2022-04-27 NOTE — Patient Instructions (Signed)
Chronic Kidney Disease, Adult Chronic kidney disease (CKD) occurs when the kidneys are slowly and permanently damaged over a long period of time. The kidneys are a pair of organs that do many important jobs in the body, including: Removing waste and extra fluid from the blood to make urine. Making hormones that maintain the amount of fluid in tissues and blood vessels. Maintaining the right amount of fluids and chemicals in the body. A small amount of kidney damage may not cause problems, but a large amount of damage may make it hard or impossible for the kidneys to work right. Steps must be taken to slow kidney damage or to stop it from getting worse. If steps are not taken, the kidneys may stop working permanently (end-stage renal disease, or ESRD). Most of the time, CKD does not go away, but it can often be controlled. People who have CKD are usually able to live full lives. What are the causes? The most common causes of this condition are diabetes and high blood pressure (hypertension). Other causes include: Cardiovascular diseases. These affect the heart and blood vessels. Kidney diseases. These include: Glomerulonephritis, or inflammation of the tiny filters in the kidneys. Interstitial nephritis. This is swelling of the small tubes of the kidneys and of the surrounding structures. Polycystic kidney disease, in which clusters of fluid-filled sacs form within the kidneys. Renal vascular disease. This includes disorders that affect the arteries and veins of the kidneys. Diseases that affect the body's defense system (immune system). A problem with urine flow. This may be caused by: Kidney stones. Cancer. An enlarged prostate, in males. A kidney infection or urinary tract infection (UTI) that keeps coming back. Vasculitis. This is swelling or inflammation of the blood vessels. What increases the risk? Your chances of having kidney disease increase with age. The following factors may make  you more likely to develop this condition: A family history of kidney disease or kidney failure. Kidney failure means the kidneys can no longer work right. Certain genetic diseases. Taking medicines often that are damaging to the kidneys. Being around or being in contact with toxic substances. Obesity. A history of tobacco use. What are the signs or symptoms? Symptoms of this condition include: Feeling very tired (lethargic) and having less energy. Swelling, or edema, of the face, legs, ankles, or feet. Nausea or vomiting, or loss of appetite. Confusion or trouble concentrating. Muscle twitches and cramps, especially in the legs. Dry, itchy skin. A metallic taste in the mouth. Producing less urine, or producing more urine (especially at night). Shortness of breath. Trouble sleeping. CKD may also result in not having enough red blood cells or hemoglobin in the blood (anemia) or having weak bones (bone disease). Symptoms develop slowly and may not be obvious until the kidney damage becomes severe. It is possible to have kidney disease for years without having symptoms. How is this diagnosed? This condition may be diagnosed based on: Blood tests. Urine tests. Imaging tests, such as an ultrasound or a CT scan. A kidney biopsy. This involves removing a sample of kidney tissue to be looked at under a microscope. Results from these tests will help to determine how serious the CKD is. How is this treated? There is no cure for most cases of this condition, but treatment usually relieves symptoms and prevents or slows the worsening of the disease. Treatment may include: Diet changes, which may require you to avoid alcohol and foods that are high in salt, potassium, phosphorous, and protein. Medicines. These may:   Lower blood pressure. Control blood sugar (glucose). Relieve anemia. Relieve swelling. Protect your bones. Improve the balance of salts and minerals in your blood  (electrolytes). Dialysis, which is a type of treatment that removes toxic waste from the body. It may be needed if you have kidney failure. Managing any other conditions that are causing your CKD or making it worse. Follow these instructions at home: Medicines Take over-the-counter and prescription medicines only as told by your health care provider. The amount of some medicines that you take may need to be changed. Do not take any new medicines unless approved by your health care provider. Many medicines can make kidney damage worse. Do not take any vitamin and mineral supplements unless approved by your health care provider. Many nutritional supplements can make kidney damage worse. Lifestyle  Do not use any products that contain nicotine or tobacco, such as cigarettes, e-cigarettes, and chewing tobacco. If you need help quitting, ask your health care provider. If you drink alcohol: Limit how much you use to: 0-1 drink a day for women who are not pregnant. 0-2 drinks a day for men. Know how much alcohol is in your drink. In the [place] one drink equals one 12 oz bottle of beer (355 mL), one 5 oz glass of wine (148 mL), or one 1 oz glass of hard liquor (44 mL). Maintain a healthy weight. If you need help, ask your health care provider. General instructions  Follow instructions from your health care provider about eating or drinking restrictions, including any prescribed diet. Track your blood pressure at home. Report changes in your blood pressure as told. If you are being treated for diabetes, track your blood glucose levels as told. Start or continue an exercise plan. Exercise at least 30 minutes a day, 5 days a week. Keep your immunizations up to date as told. Keep all follow-up visits. This is important. Where to find more information American Association of Kidney Patients: www.aakp.org National Kidney Foundation: www.kidney.org American Kidney Fund: www.akfinc.org Life Options:  www.lifeoptions.org Kidney School: www.kidneyschool.org Contact a health care provider if: Your symptoms get worse. You develop new symptoms. Get help right away if: You develop symptoms of ESRD. These include: Headaches. Numbness in your hands or feet. Easy bruising. Frequent hiccups. Chest pain. Shortness of breath. Lack of menstrual periods, in women. You have a fever. You are producing less urine than usual. You have pain or bleeding when you urinate or when you have a bowel movement. These symptoms may represent a serious problem that is an emergency. Do not wait to see if the symptoms will go away. Get medical help right away. Call your local emergency services (911 in the [place] Do not drive yourself to the hospital. Summary Chronic kidney disease (CKD) occurs when the kidneys become damaged slowly over a long period of time. The most common causes of this condition are diabetes and high blood pressure (hypertension). There is no cure for most cases of CKD, but treatment usually relieves symptoms and prevents or slows the worsening of the disease. Treatment may include a combination of lifestyle changes, medicines, and dialysis. This information is not intended to replace advice given to you by your health care provider. Make sure you discuss any questions you have with your health care provider. Document Revised: 02/27/2020 Document Reviewed: 02/27/2020 Elsevier Patient Education  2023 [employer]  

## 2022-04-27 NOTE — Assessment & Plan Note (Signed)
Supplement and monitor 

## 2022-04-27 NOTE — Assessment & Plan Note (Addendum)
hgba1c acceptable,mildly elevated continue to monitor, minimize simple carbs. Increase exercise as tolerated. Continue current meds, he questions if his CGM with numbers running high but when he uses his old meter his numbers are normal now.

## 2022-04-27 NOTE — Assessment & Plan Note (Signed)
Hydrate and monitor 

## 2022-04-27 NOTE — Assessment & Plan Note (Signed)
Continue to monitor low inflammation

## 2022-04-27 NOTE — Assessment & Plan Note (Signed)
Well controlled, no changes to meds. Encouraged heart healthy diet such as the DASH diet and exercise as tolerated.  °

## 2022-04-27 NOTE — Progress Notes (Signed)
Subjective:    Patient ID: [name] [name], male    DOB: Apr 29, 1946, 76 y.o.   MRN: [id number]  Chief Complaint  Patient presents with   Follow-up   Rash    HPI Patient is in today for a follow up on chronic medical concerns. No recent febrile illness or acute hospitalizations. He is struggling with the loss of his wife but is getting through this time with the support of family Denies CP/palp/SOB/HA/congestion/fevers/GI or GU c/o. Taking meds as prescribed   Past Medical History:  Diagnosis Date   Acquired atrophy of thyroid 06/05/2013   Overview:  July 2014: controlled on synthroid 89mg since 2012 May 2015: decreased to Synthroid 50 mcg  Last Assessment & Plan:  Pt doing well with most recent TSH within normal range.  Will continue current dosage of Synthroid 5105m. Pt reminded to take medication on empty stomach. Will continue to monitor with periodic laboratory assessment in 3 months.    Anemia    hemoglobin 7.4, iron deficiency, January, 2011, 2 unit transfusion, endoscopy normal, capsule endoscopy February, 2011 no small bowel abnormalities.   Most likely source gastric erosions, followed by GI   Anxiety    Bradycardia    CAD (coronary artery disease)    A. CABG in 2000,status post cardiac cath in 2006, 2009 ....continued chest pain and SOB despite oral medication adjestments including Ranexa. B. Cath November 2009/ mRCA - 2.75 x 23 Abbott Xience V drug-eluting stent ...11/26/2008 to distal  RCA leading to acute marginal.  C. Cath 07/2012 for CP - stable anatomy, med rx. d. cath 2015 and 05/30/2015 stable anatomy, consider Myoview if has CP again   Carotid artery disease ([place]/26/2016   Doppler, May 29, 2015, 1-39% bilateral ICA    Cerebral ischemia    MRI November, 2010, chronic microvascular ischemia   CKD (chronic kidney disease), stage III ([place]   Concussion    Depression    Bipolar   Diabetes mellitus ([place]   Edema    Essential hypertension 06/05/2013   Overview:   July 2014: Controlled with Bystolic Sep [id number] to atenolol.  Last Assessment & Plan:  Will change to atenolol for cost and follow Improved.  Medication compliance strongly encouraged BP: 116/64 mmHg    Overview:  July 2014: Controlled with Bystolic Sep [id number] to atenolol.  Last Assessment & Plan:  Will change to atenolol for cost and follow   Falling episodes    these have occurred in the past and again recurring 2011   Family history of adverse reaction to anesthesia    "mother died during bypass surgery but not sure if it has to do with anesthesia"   Gastric ulcer    GERD (gastroesophageal reflux disease)    H/O medication noncompliance    Due to loss of insurance   H/O multiple concussions    Hard of hearing    Heart murmur    History of blood transfusion 12/20/2013   History of kidney stones    Hx of CABG    2000,  / one median sternotomy suture broken her chest x-ray November, 2010, no clinical significance   Hyperlipidemia    Hypertension    pt. denies   Iron deficiency anemia    Long term (current) use of anticoagulants [Z79.01] 07/20/2016   Low back pain 06/12/2009   Qualifier: Diagnosis of  By: [name]  Nephrolithiasis    Orthostasis    OSA (obstructive  sleep apnea)    PAF (paroxysmal atrial fibrillation) ([place])    a. dx 2017, started on amiodarone/warfarin but patient intermittently noncompliant.   PSVT (paroxysmal supraventricular tachycardia) ([place])    RBBB 07/09/2009   Qualifier: Diagnosis of  By: [name], MD, [facility], [name]    RLS (restless legs syndrome) 09/19/2009   Qualifier: Diagnosis of  By: [name]   Overview:  July 2014: Controlled with Mirapex  Last Assessment & Plan:  Patient is doing well. Will continue current management and follow clinically.   Seizure disorder ([place]) 01/09/2017   Spondylosis    C5-6, C6-7 MRI 2010   Syncope 03/2016   TBI (traumatic brain injury) ([place]) 2019   Thyroid disease    Tubulovillous adenoma  of colon 2007   Vitamin D deficiency 05/17/2017   Wears glasses     Past Surgical History:  Procedure Laterality Date   ANTERIOR CERVICAL DECOMP/DISCECTOMY FUSION N/A 05/31/2016   Procedure: ANTERIOR CERVICAL DECOMPRESSION/DISCECTOMY FUSION CERVICAL FIVE-SIX,CERVICAL SIX-SEVEN;  Surgeon: [name], MD;  Location: [place] [facility]URO ORS;  Service: Neurosurgery;  Laterality: N/A;   CARDIAC CATHETERIZATION N/A 05/30/2015   Procedure: Left Heart Cath and Coronary Angiography;  Surgeon: [name], MD;  Location: [place] [facility] LAB;  Service: Cardiovascular;  Laterality: N/A;   COLONOSCOPY     CORONARY ARTERY BYPASS GRAFT     2000   ESOPHAGOGASTRODUODENOSCOPY     LEFT HEART CATH AND CORS/GRAFTS ANGIOGRAPHY N/A 12/15/2017   Procedure: LEFT HEART CATH AND CORS/GRAFTS ANGIOGRAPHY;  Surgeon: [name], MD;  Location: [place] CV LAB;  Service: Cardiovascular;  Laterality: N/A;   LEFT HEART CATHETERIZATION WITH CORONARY/GRAFT ANGIOGRAM N/A 08/01/2012   Procedure: LEFT HEART CATHETERIZATION WITH Beatrix Fetters;  Surgeon: [name], MD;  Location[facility] CATH LAB;  Service: Cardiovascular;  Laterality: N/A;   LEFT HEART CATHETERIZATION WITH CORONARY/GRAFT ANGIOGRAM N/A 01/03/2015   Procedure: LEFT HEART CATHETERIZATION WITH Beatrix Fetters;  Surgeon: [name], MD;  Location: [facility] CATH LAB;  Service: Cardiovascular;  Laterality: N/A;   NASAL SEPTUM SURGERY     UP3   PERCUTANEOUS CORONARY STENT INTERVENTION (PCI-S)  10/2008   mRCA PCI  2.75 x 23 Abbott Xience V drug-eluting stent    RIGHT/LEFT HEART CATH AND CORONARY/GRAFT ANGIOGRAPHY N/A 01/07/2020   Procedure: RIGHT/LEFT HEART CATH AND CORONARY/GRAFT ANGIOGRAPHY;  Surgeon: [name], MD;  Location: [place] CV LAB;  Service: Cardiovascular;  Laterality: N/A;   TOTAL KNEE ARTHROPLASTY Right 09/22/2021   Procedure: RIGHT TOTAL KNEE ARTHROPLASTY;  Surgeon: [name], MD;  Location: [facility] ORS;  Service:  Orthopedics;  Laterality: Right;   ULTRASOUND GUIDANCE FOR VASCULAR ACCESS  12/15/2017   Procedure: Ultrasound Guidance For Vascular Access;  Surgeon: [name], MD;  Location: [place] CV LAB;  Service: Cardiovascular;;    Family History  Problem Relation Age of Onset   Pancreatic cancer Brother    Diabetes Brother    Coronary artery disease Brother    Stroke Brother    Diabetes Brother    Diabetes Mother    Heart failure Mother    Heart failure Father    Hypothyroidism Brother    Coronary artery disease Brother    Other Brother        colon surgery   Heart attack Other        Nephew   Irregular heart beat Daughter    Cancer Maternal Grandmother        unknown  Colon cancer Neg Hx    Stomach cancer Neg Hx    Liver cancer Neg Hx    Rectal cancer Neg Hx    Esophageal cancer Neg Hx     Social History   Socioeconomic History   Marital status: Married    Spouse name: Not on file   Number of children: 4   Years of education: 13   Highest education level: Not on file  Occupational History   Occupation: retired  Tobacco Use   Smoking status: Never    Passive exposure: Never   Smokeless tobacco: Never  Vaping Use   Vaping Use: Never used  Substance and Sexual Activity   Alcohol use: No    Alcohol/week: 0.0 standard drinks    Comment: stopped drinking in 1998   Drug use: No   Sexual activity: Not Currently    Partners: Female  Other Topics Concern   Not on file  Social History Narrative   Patient is right handed.   Patient does not drink caffeine.   Social Determinants of Health   Financial Resource Strain: Not on file  Food Insecurity: Not on file  Transportation Needs: Not on file  Physical Activity: Not on file  Stress: Not on file  Social Connections: Not on file  Intimate Partner Violence: Not on file    Outpatient Medications Prior to Visit  Medication Sig Dispense Refill   apixaban (ELIQUIS) 5 MG TABS tablet Take 1 tablet (5 mg  total) by mouth 2 (two) times daily. 60 tablet 5   aspirin EC 81 MG tablet Take 81 mg by mouth daily. Swallow whole.     atorvastatin (LIPITOR) 80 MG tablet Take 1 tablet (80 mg total) by mouth daily. 90 tablet 0   Continuous Blood Gluc Sensor (FREESTYLE LIBRE 2 SENSOR) MISC Use as instructed to check blood sugar, change every 14 days 6 each 3   Diclofenac Sodium (PENNSAID) 2 % SOLN Place 1 application onto the skin 2 (two) times daily. (Patient taking differently: Place 1 application. onto the skin 2 (two) times daily as needed (pain).) 112 g 2   divalproex (DEPAKOTE) 500 MG DR tablet Take 1 tablet (500 mg total) by mouth 2 (two) times daily. 60 tablet 5   donepezil (ARICEPT) 10 MG tablet Take 1 tablet (10 mg total) by mouth at bedtime. 90 tablet 4   fenofibrate 160 MG tablet Take 1 tablet (160 mg total) by mouth daily. 30 tablet 0   glucose blood (ACCU-CHEK AVIVA) test strip Use as directed to check blood sugar 4 times daily.  DX E11.9 (Patient taking differently: Use as directed to check blood sugar 4 times daily.  DX E11.9) 200 each 6   hydrocortisone 2.5 % cream Apply on to the skin 2 (two) times daily. 30 g 1   hydrOXYzine (ATARAX) 10 MG tablet Take 1 tablet (10 mg total) by mouth 3 (three) times daily as needed for anxiety or itching. 30 tablet 1   insulin glargine, 2 Unit Dial, (TOUJEO MAX SOLOSTAR) 300 UNIT/ML Solostar Pen Inject 65 Units into the skin daily in the afternoon. 30 mL 2   insulin lispro (HUMALOG KWIKPEN) 100 UNIT/ML KwikPen Inject 10 Units into the skin 3 (three) times daily. 30 mL 3   Insulin Pen Needle 31G X 5 MM MISC Use as directed to inject insulin in the morning, at noon, in the evening, and at bedtime. 400 each 3   isosorbide mononitrate (IMDUR) 120 MG 24 hr tablet Take 1  tablet (120 mg total) by mouth daily. 90 tablet 3   levothyroxine (SYNTHROID) 100 MCG tablet TAKE 1 TABLET (100 MCG TOTAL) BY MOUTH DAILY. 90 tablet 1   meclizine (ANTIVERT) 25 MG tablet Take 1 tablet  (25 mg total) by mouth 3 (three) times daily as needed for dizziness. 30 tablet 0   nitroGLYCERIN (NITROSTAT) 0.4 MG SL tablet PLACE 1 TABLET UNDER THE TONGUE EVERY 5 MINUTES AS NEEDED FOR CHEST PAIN. 25 tablet 1   pantoprazole (PROTONIX) 40 MG tablet TAKE 1 TABLET BY MOUTH EVERY DAY 90 tablet 3   pramipexole (MIRAPEX) 1 MG tablet Take 1/2 to 1 tablet by mouth at bedtime 90 tablet 0   ranolazine (RANEXA) 500 MG 12 hr tablet TAKE 1 TABLET BY MOUTH TWICE DAILY 60 tablet 5   sertraline (ZOLOFT) 100 MG tablet TAKE 1 TABLET BY MOUTH TWICE DAILY 180 tablet 0   tiZANidine (ZANAFLEX) 4 MG tablet Take 1 tablet (4 mg total) by mouth at night for spasms and pain. 30 tablet 0   triamcinolone cream (KENALOG) 0.1 % Apply 1 application topically 2 (two) times daily. Use as needed for itchy rash 30 g 1   Vitamin D, Ergocalciferol, (DRISDOL) 1.25 MG (50000 UNIT) CAPS capsule TAKE 1 CAPSULE BY MOUTH EVERY 7 DAYS 4 capsule 4   cetirizine (ZYRTEC) 10 MG tablet TAKE 1 TABLET (10 MG TOTAL) BY MOUTH DAILY. 100 tablet 2   No facility-administered medications prior to visit.    Allergies  Allergen Reactions   Morphine Other (See Comments)    Hallucinations   Shellfish-Derived Products Itching    Review of Systems  Constitutional:  Positive for malaise/fatigue. Negative for fever.  HENT:  Negative for congestion.   Eyes:  Negative for blurred vision.  Respiratory:  Negative for shortness of breath.   Cardiovascular:  Negative for chest pain, palpitations and leg swelling.  Gastrointestinal:  Negative for abdominal pain, blood in stool and nausea.  Genitourinary:  Negative for dysuria and frequency.  Musculoskeletal:  Negative for falls.  Skin:  Positive for itching and rash.  Neurological:  Negative for dizziness, loss of consciousness and headaches.  Endo/Heme/Allergies:  Negative for environmental allergies.  Psychiatric/Behavioral:  Positive for depression. The patient is nervous/anxious.        Objective:    Physical Exam Constitutional:      General: He is not in acute distress.    Appearance: Normal appearance. He is not ill-appearing or toxic-appearing.  HENT:     Head: Normocephalic and atraumatic.     Right Ear: External ear normal.     Left Ear: External ear normal.     Nose: Nose normal.  Eyes:     General:        Right eye: No discharge.        Left eye: No discharge.  Cardiovascular:     Rate and Rhythm: Normal rate.     Heart sounds: No murmur heard. Pulmonary:     Effort: Pulmonary effort is normal.  Abdominal:     General: Bowel sounds are normal.     Palpations: There is no mass.     Tenderness: There is no guarding.  Musculoskeletal:     Cervical back: Neck supple.     Right lower leg: No edema.     Left lower leg: No edema.  Skin:    General: Skin is warm.     Findings: Erythema and rash present.     Comments: Maculoapular rash over both  flanks, back of neck patches on arms  Neurological:     Mental Status: He is alert and oriented to person, place, and time.  Psychiatric:        Behavior: Behavior normal.    BP 124/78 (BP Location: Left Arm, Patient Position: Sitting, Cuff Size: Normal)   Pulse 71   Resp 20   Ht '5\' 9"'$  (1.753 m)   Wt 192 lb (87.1 kg)   SpO2 98%   BMI 28.35 kg/m  Wt Readings from Last 3 Encounters:  04/27/22 192 lb (87.1 kg)  04/13/22 192 lb 9.6 oz (87.4 kg)  03/22/22 190 lb 9.6 oz (86.5 kg)    Diabetic Foot Exam - Simple   No data filed    Lab Results  Component Value Date   WBC 7.9 02/14/2022   HGB 13.6 02/14/2022   HCT 40.0 02/14/2022   PLT 272 02/14/2022   GLUCOSE 46 (L) 02/14/2022   CHOL 101 06/29/2021   TRIG 118.0 06/29/2021   HDL 31.40 (L) 06/29/2021   LDLDIRECT 77.0 08/20/2019   LDLCALC 46 06/29/2021   ALT 17 02/13/2022   AST 23 02/13/2022   NA 140 02/14/2022   K 3.8 02/14/2022   CL 109 02/14/2022   CREATININE 1.55 (H) 02/14/2022   BUN 14 02/14/2022   CO2 25 02/14/2022   TSH 3.42 06/29/2021    PSA 3.14 08/20/2019   INR 1.2 07/08/2021   HGBA1C 8.7 (A) 03/22/2022   MICROALBUR <0.7 05/14/2019    Lab Results  Component Value Date   TSH 3.42 06/29/2021   Lab Results  Component Value Date   WBC 7.9 02/14/2022   HGB 13.6 02/14/2022   HCT 40.0 02/14/2022   MCV 88.1 02/14/2022   PLT 272 02/14/2022   Lab Results  Component Value Date   NA 140 02/14/2022   K 3.8 02/14/2022   CO2 25 02/14/2022   GLUCOSE 46 (L) 02/14/2022   BUN 14 02/14/2022   CREATININE 1.55 (H) 02/14/2022   BILITOT 0.4 02/13/2022   ALKPHOS 51 02/13/2022   AST 23 02/13/2022   ALT 17 02/13/2022   PROT 6.1 (L) 02/13/2022   ALBUMIN 3.5 02/13/2022   CALCIUM 9.5 02/14/2022   ANIONGAP 6 02/14/2022   GFR 32.80 (L) 10/05/2021   Lab Results  Component Value Date   CHOL 101 06/29/2021   Lab Results  Component Value Date   HDL 31.40 (L) 06/29/2021   Lab Results  Component Value Date   LDLCALC 46 06/29/2021   Lab Results  Component Value Date   TRIG 118.0 06/29/2021   Lab Results  Component Value Date   CHOLHDL 3 06/29/2021   Lab Results  Component Value Date   HGBA1C 8.7 (A) 03/22/2022       Assessment & Plan:   Problem List Items Addressed This Visit     Depression with anxiety - Primary    Struggles with the loss of his wife, is managing with family support       Hyperlipidemia LDL goal <70    Encourage heart healthy diet such as MIND or DASH diet, increase exercise, avoid trans fats, simple carbohydrates and processed foods, consider a krill or fish or flaxseed oil cap daily. Tolerating Atorvastatin       Essential hypertension    Well controlled, no changes to meds. Encouraged heart healthy diet such as the DASH diet and exercise as tolerated.        Relevant Orders   Comprehensive metabolic panel  Dermatitis    Consider scabies permetrin cream use head to toe qhs once and can repeat once. Referred to dermatology if on response.        Relevant Orders   Ambulatory  referral to Dermatology   Vitamin D deficiency    Supplement and monitor       Relevant Orders   VITAMIN D 25 Hydroxy (Vit-D Deficiency, Fractures)   CKD (chronic kidney disease), stage IV ([place])    Hydrate and monitor       Type 2 diabetes mellitus with stage 3a chronic kidney disease, with long-term current use of insulin ([place])    hgba1c acceptable,mildly elevated continue to monitor, minimize simple carbs. Increase exercise as tolerated. Continue current meds, he questions if his CGM with numbers running high but when he uses his old meter his numbers are normal now.        Elevated sed rate    Continue to monitor low inflammation       Relevant Orders   Sedimentation rate    I am having Jasani A. Jirak start on permethrin. I am also having him maintain his glucose blood, Pennsaid, cetirizine, triamcinolone cream, pantoprazole, aspirin EC, divalproex, Vitamin D (Ergocalciferol), ranolazine, fenofibrate, apixaban, sertraline, isosorbide mononitrate, donepezil, hydrocortisone, tiZANidine, pramipexole, atorvastatin, nitroGLYCERIN, FreeStyle Libre 2 Sensor, Toujeo Max SoloStar, insulin lispro, Insulin Pen Needle, levothyroxine, hydrOXYzine, and meclizine.  Meds ordered this encounter  Medications   permethrin (ELIMITE) 5 % cream    Sig: Apply cream head to day qhs wash off in am, can repeat once in 1-2 weeks    Dispense:  60 g    Refill:  1

## 2022-04-27 NOTE — Assessment & Plan Note (Signed)
Encourage heart healthy diet such as MIND or DASH diet, increase exercise, avoid trans fats, simple carbohydrates and processed foods, consider a krill or fish or flaxseed oil cap daily. Tolerating Atorvastatin 

## 2022-04-27 NOTE — Assessment & Plan Note (Signed)
Struggles with the loss of his wife, is managing with family support

## 2022-04-27 NOTE — Assessment & Plan Note (Signed)
Consider scabies permetrin cream use head to toe qhs once and can repeat once. Referred to dermatology if on response.

## 2022-04-29 ENCOUNTER — Other Ambulatory Visit (HOSPITAL_BASED_OUTPATIENT_CLINIC_OR_DEPARTMENT_OTHER): Payer: Self-pay

## 2022-04-30 ENCOUNTER — Encounter: Payer: Self-pay | Admitting: Family Medicine

## 2022-05-05 ENCOUNTER — Other Ambulatory Visit (HOSPITAL_BASED_OUTPATIENT_CLINIC_OR_DEPARTMENT_OTHER): Payer: Self-pay

## 2022-05-06 ENCOUNTER — Other Ambulatory Visit: Payer: Self-pay | Admitting: Family Medicine

## 2022-05-07 ENCOUNTER — Other Ambulatory Visit (HOSPITAL_BASED_OUTPATIENT_CLINIC_OR_DEPARTMENT_OTHER): Payer: Self-pay

## 2022-05-07 MED ORDER — MECLIZINE HCL 25 MG PO TABS
25.0000 mg | ORAL_TABLET | Freq: Three times a day (TID) | ORAL | 0 refills | Status: DC | PRN
  Filled 2022-05-07: qty 30, 10d supply, fill #0

## 2022-05-07 MED ORDER — HYDROXYZINE HCL 10 MG PO TABS
10.0000 mg | ORAL_TABLET | Freq: Three times a day (TID) | ORAL | 1 refills | Status: DC | PRN
  Filled 2022-05-07: qty 30, 10d supply, fill #0
  Filled 2022-05-27: qty 30, 10d supply, fill #1

## 2022-05-11 ENCOUNTER — Other Ambulatory Visit (HOSPITAL_BASED_OUTPATIENT_CLINIC_OR_DEPARTMENT_OTHER): Payer: Self-pay

## 2022-05-13 ENCOUNTER — Other Ambulatory Visit: Payer: Self-pay

## 2022-05-19 ENCOUNTER — Other Ambulatory Visit (HOSPITAL_BASED_OUTPATIENT_CLINIC_OR_DEPARTMENT_OTHER): Payer: Self-pay

## 2022-05-20 ENCOUNTER — Other Ambulatory Visit (HOSPITAL_BASED_OUTPATIENT_CLINIC_OR_DEPARTMENT_OTHER): Payer: Self-pay

## 2022-05-24 NOTE — Progress Notes (Unsigned)
Patient ID: [name] [name]                 DOB: 1946/09/21                      MRN: [id number]     HPI: MOXON MESSLER is a 76 y.o. male referred by [name] to HTN clinic. PMH is significant for CAD s/p CABG in 2000 and DES to RCA in 2009, paroxysmal atrial fibrillation, PSVT, CKD III, noncompliance, orthostatic hypotension, iron deficiency anemia, prior gastric erosions/ulcer, bipolar depression, DM (A1c 7.6%), HTN, HLD, kidney stones, OSA, RBBB, thyroid disease, and back pain.  Hospitalized in March 2023 with angina/chest pain and found to have "unremarkable cardiac evaluation" and cleared by cardiology for discharge and outpatient follow up. During admission, metoprolol, amlodipine, isosorbide, and furosemide were discontinued due to bradycardia/hypotension. At 02/26/22 follow-up with [name], patient reported continuing medications as he was uncomfortable taking recommendations from hospital doctors. BP in clinic above goal 138/68. Measured BP at home, systolic mostly in 614E. Isosorbide mononitrate 120 mg daily was continued; metoprolol succinate 75 mg and amlodipine 5 mg were stopped for dizziness. Patient has been controlled at recent endocrinology appt (122/70) and PCP appt (124/78).   Patient presents to [facility] HT[facility] clinic today. ***Denies dizziness, balance, lightheadedness, headache, blurred vision, edema.  Dizziness Home BP log, cuff? Modifiable RF's  Diet Exercise Options Add back amlodipine if needed Toprol if palpitations/ room on HR  Current HTN meds: *** ?isosorbide mononitrate 120 mg daily  Previously tried: Metoprolol succinate 75 mg daily, amlodipine 5 mg daily (discontinued during March 2023 but admission for bradycardia/hypotension but patient continued; stopped at 02/26/21 clinic visit with Irish Lack)  BP goal: <130/80 mmHg  Family History: Cancer in his maternal grandmother; Coronary artery disease in his brother and brother; Diabetes in his brother,  brother, and mother; Heart attack in an other family member; Heart failure in his father and mother; Hypothyroidism in his brother; Irregular heart beat in his daughter; Other in his brother; Pancreatic cancer in his brother; Stroke in his brother.   Social History: Denies tobacco, alcohol, or illicit drug use  Diet: ***  Exercise: ***  Home BP readings: ***  Wt Readings from Last 3 Encounters:  04/27/22 192 lb (87.1 kg)  04/13/22 192 lb 9.6 oz (87.4 kg)  03/22/22 190 lb 9.6 oz (86.5 kg)   BP Readings from Last 3 Encounters:  04/27/22 124/78  04/13/22 122/70  03/22/22 (!) 142/64   Pulse Readings from Last 3 Encounters:  04/27/22 71  04/13/22 65  03/22/22 87   Renal function: CrCl cannot be calculated (Patient's most recent lab result is older than the maximum 21 days allowed.).  Past Medical History:  Diagnosis Date   Acquired atrophy of thyroid 06/05/2013   Overview:  July 2014: controlled on synthroid 54mg since 2012 May 2015: decreased to Synthroid 50 mcg  Last Assessment & Plan:  Pt doing well with most recent TSH within normal range.  Will continue current dosage of Synthroid 544m. Pt reminded to take medication on empty stomach. Will continue to monitor with periodic laboratory assessment in 3 months.    Anemia    hemoglobin 7.4, iron deficiency, January, 2011, 2 unit transfusion, endoscopy normal, capsule endoscopy February, 2011 no small bowel abnormalities.   Most likely source gastric erosions, followed by GI   Anxiety    Bradycardia    CAD (coronary artery disease)    A. CABG in 2000,status  post cardiac cath in 2006, 2009 ....continued chest pain and SOB despite oral medication adjestments including Ranexa. B. Cath November 2009/ mRCA - 2.75 x 23 Abbott Xience V drug-eluting stent ...11/26/2008 to distal  RCA leading to acute marginal.  C. Cath 07/2012 for CP - stable anatomy, med rx. d. cath 2015 and 05/30/2015 stable anatomy, consider Myoview if has CP again    Carotid artery disease ([place]) 08/01/2015   Doppler, May 29, 2015, 1-39% bilateral ICA    Cerebral ischemia    MRI November, 2010, chronic microvascular ischemia   CKD (chronic kidney disease), stage III ([place])    Concussion    Depression    Bipolar   Diabetes mellitus ([place])    Edema    Essential hypertension 06/05/2013   Overview:  July 2014: Controlled with Bystolic Sep 8315: Changed to atenolol.  Last Assessment & Plan:  Will change to atenolol for cost and follow Improved.  Medication compliance strongly encouraged BP: 116/64 mmHg    Overview:  July 2014: Controlled with Bystolic Sep 1761: Changed to atenolol.  Last Assessment & Plan:  Will change to atenolol for cost and follow   Falling episodes    these have occurred in the past and again recurring 2011   Family history of adverse reaction to anesthesia    "mother died during bypass surgery but not sure if it has to do with anesthesia"   Gastric ulcer    GERD (gastroesophageal reflux disease)    H/O medication noncompliance    Due to loss of insurance   H/O multiple concussions    Hard of hearing    Heart murmur    History of blood transfusion 12/20/2013   History of kidney stones    Hx of CABG    2000,  / one median sternotomy suture broken her chest x-ray November, 2010, no clinical significance   Hyperlipidemia    Hypertension    pt. denies   Iron deficiency anemia    Long term (current) use of anticoagulants [Z79.01] 07/20/2016   Low back pain 06/12/2009   Qualifier: Diagnosis of  By: [name]    Nephrolithiasis    Orthostasis    OSA (obstructive sleep apnea)    PAF (paroxysmal atrial fibrillation) ([place])    a. dx 2017, started on amiodarone/warfarin but patient intermittently noncompliant.   PSVT (paroxysmal supraventricular tachycardia) ([place])    RBBB 07/09/2009   Qualifier: Diagnosis of  By: [name], MD, [facility], [name]    RLS (restless legs syndrome) 09/19/2009   Qualifier: Diagnosis of  By: [name]   Overview:  July 2014: Controlled with Mirapex  Last Assessment & Plan:  Patient is doing well. Will continue current management and follow clinically.   Seizure disorder ([place]) 01/09/2017   Spondylosis    C5-6, C6-7 MRI 2010   Syncope 03/2016   TBI (traumatic brain injury) ([place]) 2019   Thyroid disease    Tubulovillous adenoma of colon 2007   Vitamin D deficiency 05/17/2017   Wears glasses    Current Outpatient Medications on File Prior to Visit  Medication Sig Dispense Refill   apixaban (ELIQUIS) 5 MG TABS tablet Take 1 tablet (5 mg total) by mouth 2 (two) times daily. 60 tablet 5   aspirin EC 81 MG tablet Take 81 mg by mouth daily. Swallow whole.     atorvastatin (LIPITOR) 80 MG tablet Take 1 tablet (80 mg total) by mouth daily. 90 tablet 0   cetirizine (  ZYRTEC) 10 MG tablet TAKE 1 TABLET (10 MG TOTAL) BY MOUTH DAILY. 100 tablet 2   Continuous Blood Gluc Receiver (FREESTYLE LIBRE 2 READER) DEVI Use as directed 1 each 1   Continuous Blood Gluc Sensor (FREESTYLE LIBRE 2 SENSOR) MISC Use as instructed to check blood sugar, change every 14 days 6 each 3   Diclofenac Sodium (PENNSAID) 2 % SOLN Place 1 application onto the skin 2 (two) times daily. (Patient taking differently: Place 1 application. onto the skin 2 (two) times daily as needed (pain).) 112 g 2   divalproex (DEPAKOTE) 500 MG DR tablet Take 1 tablet (500 mg total) by mouth 2 (two) times daily. 60 tablet 5   donepezil (ARICEPT) 10 MG tablet Take 1 tablet (10 mg total) by mouth at bedtime. 90 tablet 4   fenofibrate 160 MG tablet Take 1 tablet (160 mg total) by mouth daily. 30 tablet 0   glucose blood (ACCU-CHEK AVIVA) test strip Use as directed to check blood sugar 4 times daily.  DX E11.9 (Patient taking differently: Use as directed to check blood sugar 4 times daily.  DX E11.9) 200 each 6   hydrocortisone 2.5 % cream Apply on to the skin 2 (two) times daily. 30 g 1   hydrOXYzine (ATARAX) 10 MG tablet Take 1 tablet (10 mg  total) by mouth 3 (three) times daily as needed for anxiety or itching. 30 tablet 1   insulin glargine, 2 Unit Dial, (TOUJEO MAX SOLOSTAR) 300 UNIT/ML Solostar Pen Inject 65 Units into the skin daily in the afternoon. 30 mL 2   insulin lispro (HUMALOG KWIKPEN) 100 UNIT/ML KwikPen Inject 10 Units into the skin 3 (three) times daily. 30 mL 3   Insulin Pen Needle 31G X 5 MM MISC Use as directed to inject insulin in the morning, at noon, in the evening, and at bedtime. 400 each 3   isosorbide mononitrate (IMDUR) 120 MG 24 hr tablet Take 1 tablet (120 mg total) by mouth daily. 90 tablet 3   levothyroxine (SYNTHROID) 100 MCG tablet TAKE 1 TABLET (100 MCG TOTAL) BY MOUTH DAILY. 90 tablet 1   meclizine (ANTIVERT) 25 MG tablet Take 1 tablet (25 mg total) by mouth 3 (three) times daily as needed for dizziness. 30 tablet 0   nitroGLYCERIN (NITROSTAT) 0.4 MG SL tablet PLACE 1 TABLET UNDER THE TONGUE EVERY 5 MINUTES AS NEEDED FOR CHEST PAIN. 25 tablet 1   pantoprazole (PROTONIX) 40 MG tablet TAKE 1 TABLET BY MOUTH EVERY DAY 90 tablet 3   permethrin (ELIMITE) 5 % cream Apply cream head to bottom soles of feet at bedtime and wash off in morning, can repeat once in 1-2 weeks 60 g 1   pramipexole (MIRAPEX) 1 MG tablet Take 1/2 to 1 tablet by mouth at bedtime 90 tablet 0   ranolazine (RANEXA) 500 MG 12 hr tablet TAKE 1 TABLET BY MOUTH TWICE DAILY 60 tablet 5   sertraline (ZOLOFT) 100 MG tablet TAKE 1 TABLET BY MOUTH TWICE DAILY 180 tablet 0   tiZANidine (ZANAFLEX) 4 MG tablet Take 1 tablet (4 mg total) by mouth at night for spasms and pain. 30 tablet 0   triamcinolone cream (KENALOG) 0.1 % Apply 1 application topically 2 (two) times daily. Use as needed for itchy rash 30 g 1   Vitamin D, Ergocalciferol, (DRISDOL) 1.25 MG (50000 UNIT) CAPS capsule TAKE 1 CAPSULE BY MOUTH EVERY 7 DAYS 4 capsule 4   No current facility-administered medications on file prior to visit.  Allergies  Allergen Reactions   Morphine Other  (See Comments)    Hallucinations   Shellfish-Derived Products Itching   There were no vitals taken for this visit.   Assessment/Plan:  1. Hypertension - BP of *** is *** at goal of <130/80 mmHg.

## 2022-05-25 ENCOUNTER — Other Ambulatory Visit (HOSPITAL_BASED_OUTPATIENT_CLINIC_OR_DEPARTMENT_OTHER): Payer: Self-pay

## 2022-05-25 ENCOUNTER — Ambulatory Visit: Payer: Medicare Other

## 2022-05-25 DIAGNOSIS — H2513 Age-related nuclear cataract, bilateral: Secondary | ICD-10-CM | POA: Diagnosis not present

## 2022-05-25 DIAGNOSIS — H2511 Age-related nuclear cataract, right eye: Secondary | ICD-10-CM | POA: Diagnosis not present

## 2022-05-25 DIAGNOSIS — H18413 Arcus senilis, bilateral: Secondary | ICD-10-CM | POA: Diagnosis not present

## 2022-05-25 DIAGNOSIS — H25043 Posterior subcapsular polar age-related cataract, bilateral: Secondary | ICD-10-CM | POA: Diagnosis not present

## 2022-05-25 DIAGNOSIS — H25013 Cortical age-related cataract, bilateral: Secondary | ICD-10-CM | POA: Diagnosis not present

## 2022-05-25 MED ORDER — BESIVANCE 0.6 % OP SUSP
OPHTHALMIC | 1 refills | Status: DC
  Filled 2022-05-25: qty 5, 33d supply, fill #0
  Filled 2023-04-08: qty 5, 33d supply, fill #1
  Filled 2023-04-12: qty 5, 30d supply, fill #1

## 2022-05-25 MED ORDER — PREDNISOLONE ACETATE 1 % OP SUSP
OPHTHALMIC | 1 refills | Status: DC
  Filled 2022-05-25: qty 5, 25d supply, fill #0

## 2022-05-25 MED ORDER — KETOROLAC TROMETHAMINE 0.5 % OP SOLN
OPHTHALMIC | 1 refills | Status: DC
  Filled 2022-05-25: qty 5, 25d supply, fill #0

## 2022-05-26 ENCOUNTER — Other Ambulatory Visit (HOSPITAL_BASED_OUTPATIENT_CLINIC_OR_DEPARTMENT_OTHER): Payer: Self-pay

## 2022-05-27 ENCOUNTER — Other Ambulatory Visit (HOSPITAL_BASED_OUTPATIENT_CLINIC_OR_DEPARTMENT_OTHER): Payer: Self-pay

## 2022-05-27 ENCOUNTER — Other Ambulatory Visit: Payer: Self-pay | Admitting: Family Medicine

## 2022-05-27 MED ORDER — SERTRALINE HCL 100 MG PO TABS
ORAL_TABLET | Freq: Two times a day (BID) | ORAL | 0 refills | Status: DC
  Filled 2022-05-27: qty 180, 90d supply, fill #0

## 2022-05-27 MED ORDER — MECLIZINE HCL 25 MG PO TABS
25.0000 mg | ORAL_TABLET | Freq: Three times a day (TID) | ORAL | 0 refills | Status: DC | PRN
  Filled 2022-05-27: qty 30, 10d supply, fill #0

## 2022-05-28 ENCOUNTER — Encounter: Payer: Self-pay | Admitting: Family Medicine

## 2022-05-28 DIAGNOSIS — R21 Rash and other nonspecific skin eruption: Secondary | ICD-10-CM

## 2022-06-01 ENCOUNTER — Ambulatory Visit (INDEPENDENT_AMBULATORY_CARE_PROVIDER_SITE_OTHER): Payer: Medicare Other | Admitting: Internal Medicine

## 2022-06-01 ENCOUNTER — Encounter: Payer: Self-pay | Admitting: Internal Medicine

## 2022-06-01 VITALS — BP 124/70 | HR 62 | Ht 69.0 in | Wt 185.0 lb

## 2022-06-01 DIAGNOSIS — Z794 Long term (current) use of insulin: Secondary | ICD-10-CM | POA: Diagnosis not present

## 2022-06-01 DIAGNOSIS — E1122 Type 2 diabetes mellitus with diabetic chronic kidney disease: Secondary | ICD-10-CM | POA: Diagnosis not present

## 2022-06-01 DIAGNOSIS — N1831 Chronic kidney disease, stage 3a: Secondary | ICD-10-CM

## 2022-06-01 DIAGNOSIS — E1165 Type 2 diabetes mellitus with hyperglycemia: Secondary | ICD-10-CM | POA: Diagnosis not present

## 2022-06-01 DIAGNOSIS — G63 Polyneuropathy in diseases classified elsewhere: Secondary | ICD-10-CM

## 2022-06-01 DIAGNOSIS — E1159 Type 2 diabetes mellitus with other circulatory complications: Secondary | ICD-10-CM | POA: Diagnosis not present

## 2022-06-01 DIAGNOSIS — E1142 Type 2 diabetes mellitus with diabetic polyneuropathy: Secondary | ICD-10-CM | POA: Diagnosis not present

## 2022-06-01 LAB — POCT GLUCOSE (DEVICE FOR HOME USE): Glucose Fasting, POC: 208 mg/dL — AB (ref 70–99)

## 2022-06-01 LAB — POCT GLYCOSYLATED HEMOGLOBIN (HGB A1C): Hemoglobin A1C: 9.4 % — AB (ref 4.0–5.6)

## 2022-06-01 NOTE — Progress Notes (Signed)
Name: [name] [name]  Age/ Sex: 76 y.o., male   MRN/ DOB: [id number], March 31, 1946     PCP: [name], MD   Reason for Endocrinology Evaluation: Type 2 Diabetes Mellitus  Initial Endocrine Consultative Visit: 11/21/2017    PATIENT IDENTIFIER: [name] [name] is a 76 y.o. male with a past medical history of T2DM, HTN, CAD,Hypothyroidism CHF and OSA. The patient has followed with Endocrinology clinic since 11/21/2017 for consultative assistance with management of his diabetes.   On his initia visit with me 04/2022 he was on NPH only with an A1c 8.7 % . He declined SGLT-2i and opted for prandial insulin . We restarted toujeo and stopped NPH  DIABETIC HISTORY:  [name] [name] was diagnosed with DM in 1989.He was on Metformin at some point.  His hemoglobin A1c has ranged from 6.4% in 2022, peaking at 8.7% in 2023.   SUBJECTIVE:   During the last visit (03/22/2022): A1c  8.7%, saw Dr. Loanne Drilling   Today (06/01/2022): [name] [name] is here for follow-up on diabetes management. He is accompanied by his daughter today  He checks his blood sugars multiple  times daily through CGM. The patient has had hypoglycemic episodes since the last clinic visit, but he did not recall this .   Denies nausea, vomiting or diarrhea  He has tingling and  numbness of feet daily   HOME DIABETES REGIMEN:  Toujeo 65 units daily  Humalog 10 units TID QAC Correction Factor : HUmalog (BG-130/25)      Statin: yes ACE-I/ARB: no      CONTINUOUS GLUCOSE MONITORING RECORD INTERPRETATION   Unable to download  61- Hi       DIABETIC COMPLICATIONS: Microvascular complications:  Neuropathy, CKD III Denies:  Last Eye Exam: Completed   Macrovascular complications:  CAD, PVD Denies: CVA   HISTORY:  Past Medical History:  Past Medical History:  Diagnosis Date   Acquired atrophy of thyroid 06/05/2013   Overview:  July 2014: controlled on synthroid 8mg since 2012 May 2015: decreased to  Synthroid 50 mcg  Last Assessment & Plan:  Pt doing well with most recent TSH within normal range.  Will continue current dosage of Synthroid 563m. Pt reminded to take medication on empty stomach. Will continue to monitor with periodic laboratory assessment in 3 months.    Anemia    hemoglobin 7.4, iron deficiency, January, 2011, 2 unit transfusion, endoscopy normal, capsule endoscopy February, 2011 no small bowel abnormalities.   Most likely source gastric erosions, followed by GI   Anxiety    Bradycardia    CAD (coronary artery disease)    A. CABG in 2000,status post cardiac cath in 2006, 2009 ....continued chest pain and SOB despite oral medication adjestments including Ranexa. B. Cath November 2009/ mRCA - 2.75 x 23 Abbott Xience V drug-eluting stent ...11/26/2008 to distal  RCA leading to acute marginal.  C. Cath 07/2012 for CP - stable anatomy, med rx. d. cath 2015 and 05/30/2015 stable anatomy, consider Myoview if has CP again   Carotid artery disease ([place]08/26/2016   Doppler, May 29, 2015, 1-39% bilateral ICA    Cerebral ischemia    MRI November, 2010, chronic microvascular ischemia   CKD (chronic kidney disease), stage III ([place]   Concussion    Depression    Bipolar   Diabetes mellitus ([place]   Edema    Essential hypertension 06/05/2013   Overview:  July 2014: Controlled with Bystolic Sep 201607Changed to atenolol.  Last Assessment &  Plan:  Will change to atenolol for cost and follow Improved.  Medication compliance strongly encouraged BP: 116/64 mmHg    Overview:  July 2014: Controlled with Bystolic Sep 2025: Changed to atenolol.  Last Assessment & Plan:  Will change to atenolol for cost and follow   Falling episodes    these have occurred in the past and again recurring 2011   Family history of adverse reaction to anesthesia    "mother died during bypass surgery but not sure if it has to do with anesthesia"   Gastric ulcer    GERD (gastroesophageal reflux disease)    H/O  medication noncompliance    Due to loss of insurance   H/O multiple concussions    Hard of hearing    Heart murmur    History of blood transfusion 12/20/2013   History of kidney stones    Hx of CABG    2000,  / one median sternotomy suture broken her chest x-ray November, 2010, no clinical significance   Hyperlipidemia    Hypertension    pt. denies   Iron deficiency anemia    Long term (current) use of anticoagulants [Z79.01] 07/20/2016   Low back pain 06/12/2009   Qualifier: Diagnosis of  By: [name]    Nephrolithiasis    Orthostasis    OSA (obstructive sleep apnea)    PAF (paroxysmal atrial fibrillation) ([place])    a. dx 2017, started on amiodarone/warfarin but patient intermittently noncompliant.   PSVT (paroxysmal supraventricular tachycardia) ([place])    RBBB 07/09/2009   Qualifier: Diagnosis of  By: [name], MD, [facility], [name]    RLS (restless legs syndrome) 09/19/2009   Qualifier: Diagnosis of  By: [name]   Overview:  July 2014: Controlled with Mirapex  Last Assessment & Plan:  Patient is doing well. Will continue current management and follow clinically.   Seizure disorder ([place]) 01/09/2017   Spondylosis    C5-6, C6-7 MRI 2010   Syncope 03/2016   TBI (traumatic brain injury) ([place]) 2019   Thyroid disease    Tubulovillous adenoma of colon 2007   Vitamin D deficiency 05/17/2017   Wears glasses    Past Surgical History:  Past Surgical History:  Procedure Laterality Date   ANTERIOR CERVICAL DECOMP/DISCECTOMY FUSION N/A 05/31/2016   Procedure: ANTERIOR CERVICAL DECOMPRESSION/DISCECTOMY FUSION CERVICAL FIVE-SIX,CERVICAL SIX-SEVEN;  Surgeon: [name], MD;  Location: [place] NEURO ORS;  Service: Neurosurgery;  Laterality: N/A;   CARDIAC CATHETERIZATION N/A 05/30/2015   Procedure: Left Heart Cath and Coronary Angiography;  Surgeon: [name], MD;  Location: [place] CV LAB;  Service: Cardiovascular;  Laterality: N/A;   COLONOSCOPY     CORONARY ARTERY  BYPASS GRAFT     2000   ESOPHAGOGASTRODUODENOSCOPY     LEFT HEART CATH AND CORS/GRAFTS ANGIOGRAPHY N/A 12/15/2017   Procedure: LEFT HEART CATH AND CORS/GRAFTS ANGIOGRAPHY;  Surgeon: [name], MD;  Location: [place] CV LAB;  Service: Cardiovascular;  Laterality: N/A;   LEFT HEART CATHETERIZATION WITH CORONARY/GRAFT ANGIOGRAM N/A 08/01/2012   Procedure: LEFT HEART CATHETERIZATION WITH Beatrix Fetters;  Surgeon: [name], MD;  Location[facility] C[facility] LAB;  Service: Cardiovascular;  Laterality: N/A;   LEFT HEART CATHETERIZATION WITH CORONARY/GRAFT ANGIOGRAM N/A 01/03/2015   Procedure: LEFT HEART CATHETERIZATION WITH Beatrix Fetters;  Surgeon: [name], MD;  Location: [facility] CATH LAB;  Service: Cardiovascular;  Laterality: N/A;   NASAL SEPTUM SURGERY     UP3   PERCUTANEOUS CORONARY STENT  INTERVENTION (PCI-S)  10/2008   mRCA PCI  2.75 x 23 Abbott Xience V drug-eluting stent    RIGHT/LEFT HEART CATH AND CORONARY/GRAFT ANGIOGRAPHY N/A 01/07/2020   Procedure: RIGHT/LEFT HEART CATH AND CORONARY/GRAFT ANGIOGRAPHY;  Surgeon: [name], MD;  Location: [place] CV LAB;  Service: Cardiovascular;  Laterality: N/A;   TOTAL KNEE ARTHROPLASTY Right 09/22/2021   Procedure: RIGHT TOTAL KNEE ARTHROPLASTY;  Surgeon: [name], MD;  Location: [facility];  Service: Orthopedics;  Laterality: Right;   ULTRASOUND GUIDANCE FOR VASCULAR ACCESS  12/15/2017   Procedure: Ultrasound Guidance For Vascular Access;  Surgeon: [name], MD;  Location: [place] CV LAB;  Service: Cardiovascular;;   Social History:  reports that he has never smoked. He has never been exposed to tobacco smoke. He has never used smokeless tobacco. He reports that he does not drink alcohol and does not use drugs. Family History:  Family History  Problem Relation Age of Onset   Pancreatic cancer Brother    Diabetes Brother    Coronary artery disease Brother    Stroke Brother    Diabetes  Brother    Diabetes Mother    Heart failure Mother    Heart failure Father    Hypothyroidism Brother    Coronary artery disease Brother    Other Brother        colon surgery   Heart attack Other        Nephew   Irregular heart beat Daughter    Cancer Maternal Grandmother        unknown    Colon cancer Neg Hx    Stomach cancer Neg Hx    Liver cancer Neg Hx    Rectal cancer Neg Hx    Esophageal cancer Neg Hx      HOME MEDICATIONS: Allergies as of 06/01/2022       Reactions   Morphine Other (See Comments)   Hallucinations   Shellfish-derived Products Itching        Medication List        Accurate as of June 01, 2022 10:35 AM. If you have any questions, ask your nurse or doctor.          aspirin EC 81 MG tablet Take 81 mg by mouth daily. Swallow whole.   atorvastatin 80 MG tablet Commonly known as: LIPITOR Take 1 tablet (80 mg total) by mouth daily.   Besivance 0.6 % Susp Generic drug: Besifloxacin HCl Instill 1 drop into right eye three times a day as directed. Please store upside down!!   cetirizine 10 MG tablet Commonly known as: ZYRTEC TAKE 1 TABLET (10 MG TOTAL) BY MOUTH DAILY.   divalproex 500 MG DR tablet Commonly known as: Depakote Take 1 tablet (500 mg total) by mouth 2 (two) times daily.   donepezil 10 MG tablet Commonly known as: ARICEPT Take 1 tablet (10 mg total) by mouth at bedtime.   Eliquis 5 MG Tabs tablet Generic drug: apixaban Take 1 tablet (5 mg total) by mouth 2 (two) times daily.   fenofibrate 160 MG tablet Take 1 tablet (160 mg total) by mouth daily.   FreeStyle [place] 2 [employer] as directed   [employer] 2 [employer] Use as instructed to check blood sugar, change every 14 days   glucose blood test strip Commonly known as: Accu-Chek Aviva Use as directed to check blood sugar 4 times daily.  DX E11.9   HumaLOG KwikPen 100 UNIT/ML KwikPen Generic drug: insulin lispro Inject  10 Units into the skin 3 (three)  times daily.   hydrocortisone 2.5 % cream Apply on to the skin 2 (two) times daily.   hydrOXYzine 10 MG tablet Commonly known as: ATARAX Take 1 tablet (10 mg total) by mouth 3 (three) times daily as needed for anxiety or itching.   isosorbide mononitrate 120 MG 24 hr tablet Commonly known as: IMDUR Take 1 tablet (120 mg total) by mouth daily.   ketorolac 0.5 % ophthalmic solution Commonly known as: ACULAR Place 1 drop into the right eye 4 times a day as directed   levothyroxine 100 MCG tablet Commonly known as: SYNTHROID TAKE 1 TABLET (100 MCG TOTAL) BY MOUTH DAILY.   meclizine 25 MG tablet Commonly known as: ANTIVERT Take 1 tablet (25 mg total) by mouth 3 (three) times daily as needed for dizziness.   nitroGLYCERIN 0.4 MG SL tablet Commonly known as: NITROSTAT PLACE 1 TABLET UNDER THE TONGUE EVERY 5 MINUTES AS NEEDED FOR CHEST PAIN.   pantoprazole 40 MG tablet Commonly known as: PROTONIX TAKE 1 TABLET BY MOUTH EVERY DAY   Pennsaid 2 % Soln Generic drug: diclofenac Sodium Place 1 application onto the skin 2 (two) times daily. What changed:  when to take this reasons to take this   Pentips 31G X 5 MM Misc Generic drug: Insulin Pen Needle Use as directed to inject insulin in the morning, at noon, in the evening, and at bedtime.   permethrin 5 % cream Commonly known as: ELIMITE Apply cream head to bottom soles of feet at bedtime and wash off in morning, can repeat once in 1-2 weeks   pramipexole 1 MG tablet Commonly known as: Mirapex Take 1/2 to 1 tablet by mouth at bedtime   prednisoLONE acetate 1 % ophthalmic suspension Commonly known as: PRED FORTE Place 1 drop into the right eye 4 times a day as directed. Please shake bottle well before each use.   ranolazine 500 MG 12 hr tablet Commonly known as: RANEXA TAKE 1 TABLET BY MOUTH TWICE DAILY   sertraline 100 MG tablet Commonly known as: ZOLOFT TAKE 1 TABLET BY MOUTH TWICE DAILY   tiZANidine 4 MG  tablet Commonly known as: ZANAFLEX Take 1 tablet (4 mg total) by mouth at night for spasms and pain.   Toujeo Max SoloStar 300 UNIT/ML Solostar Pen Generic drug: insulin glargine (2 Unit Dial) Inject 65 Units into the skin daily in the afternoon.   triamcinolone cream 0.1 % Commonly known as: KENALOG Apply 1 application topically 2 (two) times daily. Use as needed for itchy rash   Vitamin D (Ergocalciferol) 1.25 MG (50000 UNIT) Caps capsule Commonly known as: DRISDOL TAKE 1 CAPSULE BY MOUTH EVERY 7 DAYS         OBJECTIVE:   Vital Signs: BP 124/70 (BP Location: Left Arm, Patient Position: Sitting, Cuff Size: Small)   Pulse 62   Ht '5\' 9"'$  (1.753 m)   Wt 185 lb (83.9 kg)   SpO2 96%   BMI 27.32 kg/m   Wt Readings from Last 3 Encounters:  06/01/22 185 lb (83.9 kg)  04/27/22 192 lb (87.1 kg)  04/13/22 192 lb 9.6 oz (87.4 kg)     Exam: General: Pt appears well and is in NAD  Lungs: Clear with good BS bilat with no rales, rhonchi, or wheezes  Heart: RRR with normal S1 and S2 and no gallops; no murmurs; no rub  Abdomen: Normoactive bowel sounds, soft, nontender, without masses or organomegaly palpable  Extremities: Trace  pretibial edema.   Neuro: MS is good with appropriate affect, pt is alert and Ox3       DATA REVIEWED:  Lab Results  Component Value Date   HGBA1C 9.4 (A) 06/01/2022   HGBA1C 8.7 (A) 03/22/2022   HGBA1C 7.6 (A) 01/19/2022    Latest Reference Range & Units 02/14/22 00:04  Sodium 135 - 145 mmol/L 140  Potassium 3.5 - 5.1 mmol/L 3.8  Chloride 98 - 111 mmol/L 109  CO2 22 - 32 mmol/L 25  Glucose 70 - 99 mg/dL 46 (L)  BUN 8 - 23 mg/dL 14  Creatinine 0.61 - 1.24 mg/dL 1.55 (H)  Calcium 8.9 - 10.3 mg/dL 9.5  Anion gap 5 - 15  6  GFR, Estimated >60 mL/min 47 (L)     ASSESSMENT / PLAN / RECOMMENDATIONS:   1) Type 2 Diabetes Mellitus, poorly controlled, With neuropathic, CKD III and macrovascular complications - Most recent A1c of 9.4 %. Goal A1c  <7.0%.      -Patient has been noted with hyperglycemia through the day. -Discussed pharmacokinetics of basal/bolus insulin and the importance of taking prandial insulin with meals.  We also discussed avoiding sugar-sweetened beverages and snacks, when possible.   -We had discussed SGLT-2 inhibitors , discussed cardiovascular and renal benefits as well as risk of genital infection. He opted for prandial insulin as he is not comfortable taking SGLT-2 I at the time of his visit in May 2023 -We also discussed the importance of separating Humalog dosing to prevent insulin stacking that would cause hypoglycemia -I will increase his prandial dose of insulin and encouraged him to continue to use the correction scale  MEDICATIONS:  Continue Toujeo 65 units ONCE daily  Increase Humalog 12 units TIDQAC Correction Factor : HUmalog (BG -130/25)   EDUCATION / INSTRUCTIONS: BG monitoring instructions: Patient is instructed to check his blood sugars 3 times a day, before meals . Call [place] [facility] clinic if: BG persistently < 70  I reviewed the Rule of 15 for the treatment of hypoglycemia in detail with the patient. Literature supplied.   2) Diabetic complications:  Eye: Does not have known diabetic retinopathy.  Neuro/ Feet: Does  have known diabetic peripheral neuropathy .  Renal: Patient does  have known baseline CKD. He   is not on an ACEI/ARB at present.    3) Peripheral Neuropathy:  -We discussed gabapentin as a treatment option, we also discussed side effects to include lower extremity edema, and drowsiness -We have opted to proceed with topical treatment such as capsaicin cream  F/U in 3 months     Signed electronically by: Mack Guise, MD  [facility]  [facility] [address] [place], [place] [address] Phone: [phone] FAX: [phone]   CC: [name], [address] [place]  [address] Phone: [phone]  Fax: [phone]  Return to Endocrinology clinic as below: Future Appointments  Date Time Provider [place]  06/22/2022  2:30 PM [facility] PHARMACIST [facility]  08/05/2022  8:40 AM [name], MD [facility]  08/13/2022  8:50 AM Colisha Redler, Melanie Crazier, MD [facility] None  09/03/2022  9:20 AM [name], MD [facility]  03/02/2023  9:15 AM Alric Ran, MD [facility] None

## 2022-06-02 DIAGNOSIS — G63 Polyneuropathy in diseases classified elsewhere: Secondary | ICD-10-CM | POA: Insufficient documentation

## 2022-06-04 ENCOUNTER — Other Ambulatory Visit (HOSPITAL_BASED_OUTPATIENT_CLINIC_OR_DEPARTMENT_OTHER): Payer: Self-pay

## 2022-06-10 ENCOUNTER — Other Ambulatory Visit (HOSPITAL_BASED_OUTPATIENT_CLINIC_OR_DEPARTMENT_OTHER): Payer: Self-pay

## 2022-06-10 ENCOUNTER — Other Ambulatory Visit: Payer: Self-pay | Admitting: Family Medicine

## 2022-06-11 ENCOUNTER — Other Ambulatory Visit (HOSPITAL_BASED_OUTPATIENT_CLINIC_OR_DEPARTMENT_OTHER): Payer: Self-pay

## 2022-06-11 MED ORDER — HYDROXYZINE HCL 10 MG PO TABS
10.0000 mg | ORAL_TABLET | Freq: Three times a day (TID) | ORAL | 1 refills | Status: DC | PRN
  Filled 2022-06-11 – 2023-03-08 (×2): qty 30, 10d supply, fill #0
  Filled 2023-03-30: qty 30, 10d supply, fill #1

## 2022-06-16 ENCOUNTER — Encounter (HOSPITAL_BASED_OUTPATIENT_CLINIC_OR_DEPARTMENT_OTHER): Payer: Self-pay

## 2022-06-16 ENCOUNTER — Emergency Department (HOSPITAL_BASED_OUTPATIENT_CLINIC_OR_DEPARTMENT_OTHER)
Admission: EM | Admit: 2022-06-16 | Discharge: 2022-06-16 | Disposition: A | Discharge status: Home / Self Care | Payer: Medicare Other | Attending: Emergency Medicine | Admitting: Emergency Medicine

## 2022-06-16 ENCOUNTER — Other Ambulatory Visit: Payer: Self-pay

## 2022-06-16 ENCOUNTER — Other Ambulatory Visit (HOSPITAL_BASED_OUTPATIENT_CLINIC_OR_DEPARTMENT_OTHER): Payer: Self-pay

## 2022-06-16 ENCOUNTER — Emergency Department (HOSPITAL_BASED_OUTPATIENT_CLINIC_OR_DEPARTMENT_OTHER): Payer: Medicare Other | Admitting: Radiology

## 2022-06-16 DIAGNOSIS — Z794 Long term (current) use of insulin: Secondary | ICD-10-CM | POA: Insufficient documentation

## 2022-06-16 DIAGNOSIS — R079 Chest pain, unspecified: Secondary | ICD-10-CM | POA: Diagnosis not present

## 2022-06-16 DIAGNOSIS — Z7901 Long term (current) use of anticoagulants: Secondary | ICD-10-CM | POA: Insufficient documentation

## 2022-06-16 DIAGNOSIS — R0602 Shortness of breath: Secondary | ICD-10-CM | POA: Diagnosis not present

## 2022-06-16 DIAGNOSIS — I2510 Atherosclerotic heart disease of native coronary artery without angina pectoris: Secondary | ICD-10-CM | POA: Diagnosis not present

## 2022-06-16 DIAGNOSIS — Z955 Presence of coronary angioplasty implant and graft: Secondary | ICD-10-CM | POA: Diagnosis not present

## 2022-06-16 DIAGNOSIS — R072 Precordial pain: Secondary | ICD-10-CM | POA: Diagnosis not present

## 2022-06-16 DIAGNOSIS — R112 Nausea with vomiting, unspecified: Secondary | ICD-10-CM | POA: Diagnosis not present

## 2022-06-16 DIAGNOSIS — Z7982 Long term (current) use of aspirin: Secondary | ICD-10-CM | POA: Diagnosis not present

## 2022-06-16 LAB — BASIC METABOLIC PANEL
Anion gap: 12 (ref 5–15)
BUN: 21 mg/dL (ref 8–23)
CO2: 24 mmol/L (ref 22–32)
Calcium: 9.7 mg/dL (ref 8.9–10.3)
Chloride: 103 mmol/L (ref 98–111)
Creatinine, Ser: 1.08 mg/dL (ref 0.61–1.24)
GFR, Estimated: >60 mL/min (ref >60)
Glucose, Bld: 269 mg/dL — ABNORMAL HIGH (ref 70–99)
Potassium: 4.1 mmol/L (ref 3.5–5.1)
Sodium: 139 mmol/L (ref 135–145)

## 2022-06-16 LAB — CBC
HCT: 39.9 % (ref 39.0–52.0)
Hemoglobin: 13.8 g/dL (ref 13.0–17.0)
MCH: 29.6 pg (ref 26.0–34.0)
MCHC: 34.6 g/dL (ref 30.0–36.0)
MCV: 85.4 fL (ref 80.0–100.0)
Platelets: 159 10*3/uL (ref 150–400)
RBC: 4.67 MIL/uL (ref 4.22–5.81)
RDW: 14.1 % (ref 11.5–15.5)
WBC: 7.8 10*3/uL (ref 4.0–10.5)
nRBC: 0 % (ref 0.0–0.2)

## 2022-06-16 LAB — PROTIME-INR
INR: 1.2 (ref 0.8–1.2)
Prothrombin Time: 14.6 seconds (ref 11.4–15.2)

## 2022-06-16 LAB — TROPONIN I (HIGH SENSITIVITY)
Troponin I (High Sensitivity): 6 ng/L (ref <18)
Troponin I (High Sensitivity): 7 ng/L (ref <18)

## 2022-06-16 LAB — APTT: aPTT: 32 seconds (ref 24–36)

## 2022-06-16 MED ORDER — NITROGLYCERIN IN D5W 200-5 MCG/ML-% IV SOLN
0.0000 ug/min | INTRAVENOUS | Status: DC
  Filled 2022-06-16: qty 250

## 2022-06-16 MED ORDER — HEPARIN SODIUM (PORCINE) 5000 UNIT/ML IJ SOLN: 4000.0000 [IU] | Freq: Once | INTRAMUSCULAR | Status: DC

## 2022-06-16 MED ORDER — HEPARIN (PORCINE) 25000 UT/250ML-% IV SOLN
1150.0000 [IU]/h | INTRAVENOUS | Status: DC
  Filled 2022-06-16: qty 250

## 2022-06-16 NOTE — Progress Notes (Signed)
ANTICOAGULATION CONSULT NOTE - Initial Consult  Pharmacy Consult for Heparin Indication: chest pain/ACS  Allergies  Allergen Reactions   Morphine Other (See Comments)    Hallucinations   Shellfish-Derived Products Itching    Patient Measurements: Height: '5\' 9"'$  (175.3 cm) Weight: 83.9 kg (184 lb 15.5 oz) IBW/kg (Calculated) : 70.7 Heparin Dosing Weight: 83.9 kg  Vital Signs: Temp: 97.8 F (36.6 C) (07/12 1147) BP: 135/59 (07/12 1445) Pulse Rate: 58 (07/12 1445)  Labs: Recent Labs    06/16/22 1154 06/16/22 1348  HGB 13.8  --   HCT 39.9  --   PLT 159  --   CREATININE 1.08  --   TROPONINIHS 6 7    Estimated Creatinine Clearance: 60.9 mL/min (by C-G formula based on SCr of 1.08 mg/dL).   Medical History: Past Medical History:  Diagnosis Date   Acquired atrophy of thyroid 06/05/2013   Overview:  July 2014: controlled on synthroid 28mg since 2012 May 2015: decreased to Synthroid 50 mcg  Last Assessment & Plan:  Pt doing well with most recent TSH within normal range.  Will continue current dosage of Synthroid 516m. Pt reminded to take medication on empty stomach. Will continue to monitor with periodic laboratory assessment in 3 months.    Anemia    hemoglobin 7.4, iron deficiency, January, 2011, 2 unit transfusion, endoscopy normal, capsule endoscopy February, 2011 no small bowel abnormalities.   Most likely source gastric erosions, followed by GI   Anxiety    Bradycardia    CAD (coronary artery disease)    A. CABG in 2000,status post cardiac cath in 2006, 2009 ....continued chest pain and SOB despite oral medication adjestments including Ranexa. B. Cath November 2009/ mRCA - 2.75 x 23 Abbott Xience V drug-eluting stent ...11/26/2008 to distal  RCA leading to acute marginal.  C. Cath 07/2012 for CP - stable anatomy, med rx. d. cath 2015 and 05/30/2015 stable anatomy, consider Myoview if has CP again   Carotid artery disease ([place]08/26/2016   Doppler, May 29, 2015, 1-39%  bilateral ICA    Cerebral ischemia    MRI November, 2010, chronic microvascular ischemia   CKD (chronic kidney disease), stage III ([facility]   Concussion    Depression    Bipolar   Diabetes mellitus ([facility]   Edema    Essential hypertension 06/05/2013   Overview:  July 2014: Controlled with Bystolic Sep [id number] to atenolol.  Last Assessment & Plan:  Will change to atenolol for cost and follow Improved.  Medication compliance strongly encouraged BP: 116/64 mmHg    Overview:  July 2014: Controlled with Bystolic Sep [id number] to atenolol.  Last Assessment & Plan:  Will change to atenolol for cost and follow   Falling episodes    these have occurred in the past and again recurring 2011   Family history of adverse reaction to anesthesia    "mother died during bypass surgery but not sure if it has to do with anesthesia"   Gastric ulcer    GERD (gastroesophageal reflux disease)    H/O medication noncompliance    Due to loss of insurance   H/O multiple concussions    Hard of hearing    Heart murmur    History of blood transfusion 12/20/2013   History of kidney stones    Hx of CABG    2000,  / one median sternotomy suture broken her chest x-ray November, 2010, no clinical significance   Hyperlipidemia    Hypertension  pt. denies   Iron deficiency anemia    Long term (current) use of anticoagulants [Z79.01] 07/20/2016   Low back pain 06/12/2009   Qualifier: Diagnosis of  By: [name]    Nephrolithiasis    Orthostasis    OSA (obstructive sleep apnea)    PAF (paroxysmal atrial fibrillation) ([place])    a. dx 2017, started on amiodarone/warfarin but patient intermittently noncompliant.   PSVT (paroxysmal supraventricular tachycardia) ([place])    RBBB 07/09/2009   Qualifier: Diagnosis of  By: [name], MD, [facility], [name]    RLS (restless legs syndrome) 09/19/2009   Qualifier: Diagnosis of  By: [name]   Overview:  July 2014: Controlled with Mirapex  Last Assessment &  Plan:  Patient is doing well. Will continue current management and follow clinically.   Seizure disorder ([place]) 01/09/2017   Spondylosis    C5-6, C6-7 MRI 2010   Syncope 03/2016   TBI (traumatic brain injury) ([place]) 2019   Thyroid disease    Tubulovillous adenoma of colon 2007   Vitamin D deficiency 05/17/2017   Wears glasses     Medications:  (Not in a hospital admission)  Scheduled:  Infusions:   heparin     nitroGLYCERIN     PRN:   Assessment: [name] [name] with a history of T2DM, HTN, CAD,Hypothyroidism CHF and OSA. Patient is presenting with chest pain. Heparin per pharmacy consult placed for chest pain/ACS.  Patient is on apixaban prior to arrival. Last dose 7/11pm. Will require aPTT monitoring due to likely falsely high anti-Xa level secondary to DOAC use.  Hgb 13.8; plt 159 Baseline HL and aPTT ordered  Goal of Therapy:  Heparin level 0.3-0.7 units/ml aPTT 66-102 seconds Monitor platelets by anticoagulation protocol: Yes   Plan:  No initial heparin bolus Start heparin infusion at 1150 units/hr Check aPTT & anti-Xa level at 2200 and daily while on heparin Continue to monitor via aPTT until levels are correlated Continue to monitor H&H and platelets  Lorelei Pont, PharmD, BCPS 06/16/2022 3:09 PM ED Clinical Pharmacist -  [phone]

## 2022-06-16 NOTE — ED Provider Notes (Signed)
[facility] EMERGENCY DEPT Provider Note   CSN: [id number] Arrival date & time: 06/16/22  1135     History  Chief Complaint  Patient presents with   Chest Pain    [name] [name] is a 76 y.o. male.  Patient with known history of coronary artery disease.  Followed by [facility] cardiology.  Followed by Dr. Emeterio Reeve.  Last seen by him on March 24 following an admission for angina on March 10.  Patient is status post CABG in 2000 and in 2009 had a RCA stent placed.  During his last admission patient had nonexercise stress test and echocardiogram without acute findings and his troponins were normal.  Patient known to have a component of angina.  Has a long-acting nitrate as well as has sublingual nitro.  Patient states that for the past 2 days has had intermittent chest pain but is been there more than not there today starting at 6 in the morning the chest pain became constant.  At rest it is better worse with walking his nitroglycerin improves a little bit but does not completely resolve it.  Patient states the pain is in the substernal area and radiates to the left arm.       Home Medications Prior to Admission medications   Medication Sig Start Date End Date Taking? Authorizing Provider  apixaban (ELIQUIS) 5 MG TABS tablet Take 1 tablet (5 mg total) by mouth 2 (two) times daily. 02/19/22   [name], MD  aspirin EC 81 MG tablet Take 81 mg by mouth daily. Swallow whole.    [provider]  atorvastatin (LIPITOR) 80 MG tablet Take 1 tablet (80 mg total) by mouth daily. 04/01/22   [name], MD  BESIVANCE 0.6 % SUSP Instill 1 drop into right eye three times a day as directed. Please store upside down!! 05/25/22     cetirizine (ZYRTEC) 10 MG tablet TAKE 1 TABLET (10 MG TOTAL) BY MOUTH DAILY. 03/03/21 03/03/22  [name], MD  Continuous Blood Gluc Receiver (FREESTYLE LIBRE 2 READER) DEVI Use as directed 11/16/21 08/18/22  Renato Shin, MD  Continuous  Blood Gluc Sensor (FREESTYLE LIBRE 2 SENSOR) MISC Use as instructed to check blood sugar, change every 14 days 04/13/22   Shamleffer, Melanie Crazier, MD  Diclofenac Sodium (PENNSAID) 2 % SOLN Place 1 application onto the skin 2 (two) times daily. Patient taking differently: Place 1 application  onto the skin 2 (two) times daily as needed (pain). 10/17/20   Rosemarie Ax, MD  divalproex (DEPAKOTE) 500 MG DR tablet Take 1 tablet (500 mg total) by mouth 2 (two) times daily. 07/07/21   Kathrynn Ducking, MD  donepezil (ARICEPT) 10 MG tablet Take 1 tablet (10 mg total) by mouth at bedtime. 03/01/22 09/08/22  Alric Ran, MD  fenofibrate 160 MG tablet Take 1 tablet (160 mg total) by mouth daily. 02/15/22   Little Ishikawa, MD  glucose blood (ACCU-CHEK AVIVA) test strip Use as directed to check blood sugar 4 times daily.  DX E11.9 Patient taking differently: Use as directed to check blood sugar 4 times daily.  DX E11.9 02/05/19   [name], MD  hydrocortisone 2.5 % cream Apply on to the skin 2 (two) times daily. 03/08/22 03/08/23  [name], MD  hydrOXYzine (ATARAX) 10 MG tablet Take 1 tablet (10 mg total) by mouth 3 (three) times daily as needed for anxiety or itching. 06/11/22   [name], MD  insulin glargine,  2 Unit Dial, (TOUJEO MAX SOLOSTAR) 300 UNIT/ML Solostar Pen Inject 65 Units into the skin daily in the afternoon. 04/13/22   Shamleffer, Melanie Crazier, MD  insulin lispro (HUMALOG KWIKPEN) 100 UNIT/ML KwikPen Inject 10 Units into the skin 3 (three) times daily. 04/13/22   Shamleffer, Melanie Crazier, MD  Insulin Pen Needle 31G X 5 MM MISC Use as directed to inject insulin in the morning, at noon, in the evening, and at bedtime. 04/13/22   Shamleffer, Melanie Crazier, MD  isosorbide mononitrate (IMDUR) 120 MG 24 hr tablet Take 1 tablet (120 mg total) by mouth daily. 02/26/22   [name], MD  ketorolac (ACULAR) 0.5 % ophthalmic solution Place 1 drop into the right eye 4 times a  day as directed 05/25/22   Darleen Crocker, MD  levothyroxine (SYNTHROID) 100 MCG tablet TAKE 1 TABLET (100 MCG TOTAL) BY MOUTH DAILY. 04/13/22 04/13/23  [name], MD  meclizine (ANTIVERT) 25 MG tablet Take 1 tablet (25 mg total) by mouth 3 (three) times daily as needed for dizziness. 05/27/22   [name], MD  nitroGLYCERIN (NITROSTAT) 0.4 MG SL tablet PLACE 1 TABLET UNDER THE TONGUE EVERY 5 MINUTES AS NEEDED FOR CHEST PAIN. 04/02/22   [name], MD  pantoprazole (PROTONIX) 40 MG tablet TAKE 1 TABLET BY MOUTH EVERY DAY 06/30/21   [name], MD  permethrin (ELIMITE) 5 % cream Apply cream head to bottom soles of feet at bedtime and wash off in morning, can repeat once in 1-2 weeks 04/27/22   [name], MD  pramipexole (MIRAPEX) 1 MG tablet Take 1/2 to 1 tablet by mouth at bedtime 03/30/22   [name], MD  prednisoLONE acetate (PRED FORTE) 1 % ophthalmic suspension Place 1 drop into the right eye 4 times a day as directed. Please shake bottle well before each use. 05/25/22   Darleen Crocker, MD  ranolazine (RANEXA) 500 MG 12 hr tablet TAKE 1 TABLET BY MOUTH TWICE DAILY 01/07/22 01/07/23  [name], MD  sertraline (ZOLOFT) 100 MG tablet TAKE 1 TABLET BY MOUTH TWICE DAILY 05/27/22   [name], MD  tiZANidine (ZANAFLEX) 4 MG tablet Take 1 tablet (4 mg total) by mouth at night for spasms and pain. 03/10/22     triamcinolone cream (KENALOG) 0.1 % Apply 1 application topically 2 (two) times daily. Use as needed for itchy rash 05/22/21   [name], MD  Vitamin D, Ergocalciferol, (DRISDOL) 1.25 MG (50000 UNIT) CAPS capsule TAKE 1 CAPSULE BY MOUTH EVERY 7 DAYS 12/08/21 12/08/22  [name], MD      Allergies    Morphine and Shellfish-derived products    Review of Systems   Review of Systems  Constitutional:  Negative for chills and fever.  HENT:  Negative for ear pain and sore throat.   Eyes:  Negative for pain and visual disturbance.  Respiratory:  Negative for cough  and shortness of breath.   Cardiovascular:  Positive for chest pain. Negative for palpitations.  Gastrointestinal:  Negative for abdominal pain and vomiting.  Genitourinary:  Negative for dysuria and hematuria.  Musculoskeletal:  Negative for arthralgias and back pain.  Skin:  Negative for color change and rash.  Neurological:  Negative for seizures and syncope.  All other systems reviewed and are negative.   Physical Exam Updated Vital Signs BP (!) 115/52   Pulse (!) 57   Temp 97.8 F (36.6 C)   Resp 11   Ht  1.753 m ('5\' 9"'$ )   Wt 83.9 kg   SpO2 97%   BMI 27.31 kg/m  Physical Exam Vitals and nursing note reviewed.  Constitutional:      General: He is not in acute distress.    Appearance: He is well-developed. He is not ill-appearing or toxic-appearing.  HENT:     Head: Normocephalic and atraumatic.  Eyes:     Conjunctiva/sclera: Conjunctivae normal.  Cardiovascular:     Rate and Rhythm: Normal rate and regular rhythm.     Heart sounds: No murmur heard. Pulmonary:     Effort: Pulmonary effort is normal. No respiratory distress.     Breath sounds: Normal breath sounds. No decreased breath sounds or wheezing.  Abdominal:     Palpations: Abdomen is soft.     Tenderness: There is no abdominal tenderness.  Musculoskeletal:        General: No swelling.     Cervical back: Neck supple.     Right lower leg: No edema.  Skin:    General: Skin is warm and dry.     Capillary Refill: Capillary refill takes less than 2 seconds.  Neurological:     General: No focal deficit present.     Mental Status: He is alert.  Psychiatric:        Mood and Affect: Mood normal.     ED Results / Procedures / Treatments   Labs (all labs ordered are listed, but only abnormal results are displayed) Labs Reviewed  BASIC METABOLIC PANEL - Abnormal; Notable for the following components:      Result Value   Glucose, Bld 269 (*)    All other components within normal limits  CBC  PROTIME-INR   APTT  APTT  HEPARIN LEVEL (UNFRACTIONATED)  HEPARIN LEVEL (UNFRACTIONATED)  TROPONIN I (HIGH SENSITIVITY)  TROPONIN I (HIGH SENSITIVITY)    EKG EKG Interpretation  Date/Time:  Wednesday June 16 2022 11:43:06 EDT Ventricular Rate:  73 PR Interval:  182 QRS Duration: 120 QT Interval:  428 QTC Calculation: 471 R Axis:   3 Text Interpretation: Normal sinus rhythm Right bundle branch block Abnormal ECG When compared with ECG of 12-Feb-2022 14:39, No significant change was found Confirmed by Fredia Sorrow [phone]) on 06/16/2022 2:14:43 PM  Radiology DG Chest 2 View  Result Date: 06/16/2022 CLINICAL DATA:  Chest pains coming and going over the last few days. When pain is happening it feels like a stabbing in left shoulder and radiates into left arm. SOB, nausea and vomiting during the episodes. EXAM: CHEST - 2 VIEW COMPARISON:  02/12/2022 FINDINGS: Prior CABG. Lower cervical plate and screw fixator. Upper normal heart size. The lungs appear clear. No blunting of the costophrenic angles. Lower thoracic spondylosis noted. IMPRESSION: 1.  No active cardiopulmonary disease is radiographically apparent. 2. Prior CABG. 3. Thoracic spondylosis. Electronically Signed   By: Van Clines [name]   On: 06/16/2022 12:27    Procedures Procedures    Medications Ordered in ED Medications  heparin ADULT infusion 100 units/mL (25000 units/238m) (has no administration in time range)  nitroGLYCERIN 50 mg in dextrose 5 % 250 mL (0.2 mg/mL) infusion (has no administration in time range)    ED Course/ Medical Decision Making/ A&P                           Medical Decision Making Amount and/or Complexity of Data Reviewed Labs: ordered. Radiology: ordered.  Risk Prescription drug management.   Initially  made an error and called [place] cardiology and discussed with Dr. Nicolette Bang.  He recommended admission for unstable angina.  Which includes starting him on heparin and nitroglycerin  drip.  Family figured out that he is actually followed by [facility] cardiologist and spoke to Dr. Tamala Julian.  Dr. Tamala Julian reviewed all his stuff and then called me back.  He felt that his delta troponins were normal which they are first 1 was 6 repeat was 7 the patient could be discharged home and he is frequently seen for pain like this and has had several admissions.  Without the troponins being elevated they would not intervene with cardiac cath.  Patient is okay with going home.  We will follow back up with Dr. Emeterio Reeve.  Negative make some adjustments in his medications.  Delta troponins without significant change patient metabolic panel normal other than glucose 269 CBC is normal patient's chest x-ray no acute cardiopulmonary process evidence of a prior CABG.  Patient's chest pain is improved but is not completely resolved.  Patient okay with the again plan for discharge home.   Final Clinical Impression(s) / ED Diagnoses Final diagnoses:  Precordial pain    Rx / DC Orders ED Discharge Orders     None         Fredia Sorrow, MD 06/16/22 1550

## 2022-06-16 NOTE — ED Triage Notes (Signed)
Patient here POV from Home.  Endorses CP that began approximately 3-4 Days. Worsening since, Especially Today. Mainly located in Central Chest and Radiates to Left Shoulder and and down Left Arm.  Mild SOB Intermittently. Mild Nausea. A Few Episodes of Emesis previously. No Known Fevers.   NAD Noted during Triage. A&Ox4. GCS 15. BIB Wheelchair.

## 2022-06-16 NOTE — Discharge Instructions (Signed)
Long discussion with Dr. Tamala Julian on-call for cardiology.  Cleared for discharge home since your troponins were normal.  Make an appointment to follow-up with your cardiologist.  Return for any new or worse symptoms.  Continue all your current medications.

## 2022-06-16 NOTE — ED Notes (Addendum)
Once roomed, pt complaining of worse chest pain and pain in his left shoulder/fingers. Butch Penny, RN notified and 2nd EKG performed.

## 2022-06-17 ENCOUNTER — Other Ambulatory Visit (HOSPITAL_BASED_OUTPATIENT_CLINIC_OR_DEPARTMENT_OTHER): Payer: Self-pay

## 2022-06-21 ENCOUNTER — Other Ambulatory Visit (HOSPITAL_BASED_OUTPATIENT_CLINIC_OR_DEPARTMENT_OTHER): Payer: Self-pay

## 2022-06-21 NOTE — Progress Notes (Deleted)
Patient ID: [name] [name]                 DOB: May 17, 1946                      MRN: [id number]     HPI: [name] [name] is a 76 y.o. male referred by [name] to HTN clinic. PMH is significant for CAD s/p CABG in 2000 and DES to RCA in 2009, paroxysmal atrial fibrillation, PSVT, CKD III, noncompliance, orthostatic hypotension, iron deficiency anemia, prior gastric erosions/ulcer, bipolar depression, DM (A1c 7.6%), HTN, HLD, kidney stones, OSA, RBBB, thyroid disease, and back pain.  Hospitalized in March 2023 with angina/chest pain and found to have unremarkable cardiac evaluation"and cleared by cardiology for discharge and outpatient follow up. During admission, metoprolol, amlodipine, isosorbide, and furosemide were discontinued due to bradycardia/hypotension. At 02/26/22 follow-up with [name], patient reported continuing medications as he was uncomfortable taking recommendations from hospital doctors. BP in clinic above goal 138/68. Measured BP at home, systolic mostly in 267T. Isosorbide mononitrate 120 mg daily was continued; metoprolol succinate 75 mg and amlodipine 5 mg were stopped for dizziness. He was referred for follow up in March 2023 but missed PharmD follow up and missed rescheduled appt as well. BP has been well controlled at recent endocrinology appt (122/70) and PCP appt (124/78). Went to the ED 7/12 with chest pain, troponins were negative, no changes were made.  Patient presents to PharmD HTN clinic today. ***Denies dizziness, balance, lightheadedness, headache, blurred vision, edema.  Dizziness Home BP log, cuff? Modifiable RF's  Diet Exercise Options Add back amlodipine if needed Toprol if palpitations/ room on HR  Current HTN meds: *** ?isosorbide mononitrate 120 mg daily  Previously tried: Metoprolol succinate 75 mg daily, amlodipine 5 mg daily (discontinued during March 2023 but admission for bradycardia/hypotension but patient continued; stopped at 02/26/21  clinic visit with Irish Lack)  BP goal: <130/80 mmHg  Family History: Cancer in his maternal grandmother; Coronary artery disease in his brother and brother; Diabetes in his brother, brother, and mother; Heart attack in an other family member; Heart failure in his father and mother; Hypothyroidism in his brother; Irregular heart beat in his daughter; Other in his brother; Pancreatic cancer in his brother; Stroke in his brother.   Social History: Denies tobacco, alcohol, or illicit drug use  Diet: ***  Exercise: ***  Home BP readings: ***  Wt Readings from Last 3 Encounters:  06/16/22 184 lb 15.5 oz (83.9 kg)  06/01/22 185 lb (83.9 kg)  04/27/22 192 lb (87.1 kg)   BP Readings from Last 3 Encounters:  06/16/22 (!) 115/52  06/01/22 124/70  04/27/22 124/78   Pulse Readings from Last 3 Encounters:  06/16/22 (!) 57  06/01/22 62  04/27/22 71   Renal function: Estimated Creatinine Clearance: 60.9 mL/min (by C-G formula based on SCr of 1.08 mg/dL).  Past Medical History:  Diagnosis Date   Acquired atrophy of thyroid 06/05/2013   Overview:  July 2014: controlled on synthroid 34mg since 2012 May 2015: decreased to Synthroid 50 mcg  Last Assessment & Plan:  Pt doing well with most recent TSH within normal range.  Will continue current dosage of Synthroid 533m. Pt reminded to take medication on empty stomach. Will continue to monitor with periodic laboratory assessment in 3 months.    Anemia    hemoglobin 7.4, iron deficiency, January, 2011, 2 unit transfusion, endoscopy normal, capsule endoscopy February, 2011 no small bowel abnormalities.  Most likely source gastric erosions, followed by GI   Anxiety    Bradycardia    CAD (coronary artery disease)    A. CABG in 2000,status post cardiac cath in 2006, 2009 ....continued chest pain and SOB despite oral medication adjestments including Ranexa. B. Cath November 2009/ mRCA - 2.75 x 23 Abbott Xience V drug-eluting stent ...11/26/2008 to  distal  RCA leading to acute marginal.  C. Cath 07/2012 for CP - stable anatomy, med rx. d. cath 2015 and 05/30/2015 stable anatomy, consider Myoview if has CP again   Carotid artery disease ([place]) 08/01/2015   Doppler, May 29, 2015, 1-39% bilateral ICA    Cerebral ischemia    MRI November, 2010, chronic microvascular ischemia   CKD (chronic kidney disease), stage III ([place])    Concussion    Depression    Bipolar   Diabetes mellitus ([place])    Edema    Essential hypertension 06/05/2013   Overview:  July 2014: Controlled with Bystolic Sep 9983: Changed to atenolol.  Last Assessment & Plan:  Will change to atenolol for cost and follow Improved.  Medication compliance strongly encouraged BP: 116/64 mmHg    Overview:  July 2014: Controlled with Bystolic Sep 3825: Changed to atenolol.  Last Assessment & Plan:  Will change to atenolol for cost and follow   Falling episodes    these have occurred in the past and again recurring 2011   Family history of adverse reaction to anesthesia    "mother died during bypass surgery but not sure if it has to do with anesthesia"   Gastric ulcer    GERD (gastroesophageal reflux disease)    H/O medication noncompliance    Due to loss of insurance   H/O multiple concussions    Hard of hearing    Heart murmur    History of blood transfusion 12/20/2013   History of kidney stones    Hx of CABG    2000,  / one median sternotomy suture broken her chest x-ray November, 2010, no clinical significance   Hyperlipidemia    Hypertension    pt. denies   Iron deficiency anemia    Long term (current) use of anticoagulants [Z79.01] 07/20/2016   Low back pain 06/12/2009   Qualifier: Diagnosis of  By: [name]    Nephrolithiasis    Orthostasis    OSA (obstructive sleep apnea)    PAF (paroxysmal atrial fibrillation) ([place])    a. dx 2017, started on amiodarone/warfarin but patient intermittently noncompliant.   PSVT (paroxysmal supraventricular tachycardia) ([place])     RBBB 07/09/2009   Qualifier: Diagnosis of  By: [name], MD, [facility], [name]    RLS (restless legs syndrome) 09/19/2009   Qualifier: Diagnosis of  By: [name]   Overview:  July 2014: Controlled with Mirapex  Last Assessment & Plan:  Patient is doing well. Will continue current management and follow clinically.   Seizure disorder ([place]) 01/09/2017   Spondylosis    C5-6, C6-7 MRI 2010   Syncope 03/2016   TBI (traumatic brain injury) ([place]) 2019   Thyroid disease    Tubulovillous adenoma of colon 2007   Vitamin D deficiency 05/17/2017   Wears glasses    Current Outpatient Medications on File Prior to Visit  Medication Sig Dispense Refill   apixaban (ELIQUIS) 5 MG TABS tablet Take 1 tablet (5 mg total) by mouth 2 (two) times daily. 60 tablet 5   aspirin EC 81 MG tablet Take 81 mg  by mouth daily. Swallow whole.     atorvastatin (LIPITOR) 80 MG tablet Take 1 tablet (80 mg total) by mouth daily. 90 tablet 0   BESIVANCE 0.6 % SUSP Instill 1 drop into right eye three times a day as directed. Please store upside down!! 5 mL 1   cetirizine (ZYRTEC) 10 MG tablet TAKE 1 TABLET (10 MG TOTAL) BY MOUTH DAILY. 100 tablet 2   Continuous Blood Gluc Receiver (FREESTYLE LIBRE 2 READER) DEVI Use as directed 1 each 1   Continuous Blood Gluc Sensor (FREESTYLE LIBRE 2 SENSOR) MISC Use as instructed to check blood sugar, change every 14 days 6 each 3   Diclofenac Sodium (PENNSAID) 2 % SOLN Place 1 application onto the skin 2 (two) times daily. (Patient taking differently: Place 1 application  onto the skin 2 (two) times daily as needed (pain).) 112 g 2   divalproex (DEPAKOTE) 500 MG DR tablet Take 1 tablet (500 mg total) by mouth 2 (two) times daily. 60 tablet 5   donepezil (ARICEPT) 10 MG tablet Take 1 tablet (10 mg total) by mouth at bedtime. 90 tablet 4   fenofibrate 160 MG tablet Take 1 tablet (160 mg total) by mouth daily. 30 tablet 0   glucose blood (ACCU-CHEK AVIVA) test strip Use as directed to  check blood sugar 4 times daily.  DX E11.9 (Patient taking differently: Use as directed to check blood sugar 4 times daily.  DX E11.9) 200 each 6   hydrocortisone 2.5 % cream Apply on to the skin 2 (two) times daily. 30 g 1   hydrOXYzine (ATARAX) 10 MG tablet Take 1 tablet (10 mg total) by mouth 3 (three) times daily as needed for anxiety or itching. 30 tablet 1   insulin glargine, 2 Unit Dial, (TOUJEO MAX SOLOSTAR) 300 UNIT/ML Solostar Pen Inject 65 Units into the skin daily in the afternoon. 30 mL 2   insulin lispro (HUMALOG KWIKPEN) 100 UNIT/ML KwikPen Inject 10 Units into the skin 3 (three) times daily. 30 mL 3   Insulin Pen Needle 31G X 5 MM MISC Use as directed to inject insulin in the morning, at noon, in the evening, and at bedtime. 400 each 3   isosorbide mononitrate (IMDUR) 120 MG 24 hr tablet Take 1 tablet (120 mg total) by mouth daily. 90 tablet 3   ketorolac (ACULAR) 0.5 % ophthalmic solution Place 1 drop into the right eye 4 times a day as directed 5 mL 1   levothyroxine (SYNTHROID) 100 MCG tablet TAKE 1 TABLET (100 MCG TOTAL) BY MOUTH DAILY. 90 tablet 1   meclizine (ANTIVERT) 25 MG tablet Take 1 tablet (25 mg total) by mouth 3 (three) times daily as needed for dizziness. 30 tablet 0   nitroGLYCERIN (NITROSTAT) 0.4 MG SL tablet PLACE 1 TABLET UNDER THE TONGUE EVERY 5 MINUTES AS NEEDED FOR CHEST PAIN. 25 tablet 1   pantoprazole (PROTONIX) 40 MG tablet TAKE 1 TABLET BY MOUTH EVERY DAY 90 tablet 3   permethrin (ELIMITE) 5 % cream Apply cream head to bottom soles of feet at bedtime and wash off in morning, can repeat once in 1-2 weeks 60 g 1   pramipexole (MIRAPEX) 1 MG tablet Take 1/2 to 1 tablet by mouth at bedtime 90 tablet 0   prednisoLONE acetate (PRED FORTE) 1 % ophthalmic suspension Place 1 drop into the right eye 4 times a day as directed. Please shake bottle well before each use. 5 mL 1   ranolazine (RANEXA) 500 MG 12  hr tablet TAKE 1 TABLET BY MOUTH TWICE DAILY 60 tablet 5    sertraline (ZOLOFT) 100 MG tablet TAKE 1 TABLET BY MOUTH TWICE DAILY 180 tablet 0   tiZANidine (ZANAFLEX) 4 MG tablet Take 1 tablet (4 mg total) by mouth at night for spasms and pain. 30 tablet 0   triamcinolone cream (KENALOG) 0.1 % Apply 1 application topically 2 (two) times daily. Use as needed for itchy rash 30 g 1   Vitamin D, Ergocalciferol, (DRISDOL) 1.25 MG (50000 UNIT) CAPS capsule TAKE 1 CAPSULE BY MOUTH EVERY 7 DAYS 4 capsule 4   No current facility-administered medications on file prior to visit.   Allergies  Allergen Reactions   Morphine Other (See Comments)    Hallucinations   Shellfish-Derived Products Itching   There were no vitals taken for this visit.   Assessment/Plan:  1. Hypertension - BP of *** is *** at goal of <130/80 mmHg.

## 2022-06-22 ENCOUNTER — Ambulatory Visit: Payer: Medicare Other

## 2022-06-23 ENCOUNTER — Encounter: Payer: Self-pay | Admitting: Internal Medicine

## 2022-06-24 ENCOUNTER — Other Ambulatory Visit (HOSPITAL_BASED_OUTPATIENT_CLINIC_OR_DEPARTMENT_OTHER): Payer: Self-pay

## 2022-06-25 ENCOUNTER — Telehealth: Payer: Self-pay | Admitting: Interventional Cardiology

## 2022-06-25 NOTE — Telephone Encounter (Signed)
Left message to call back  

## 2022-06-25 NOTE — Telephone Encounter (Signed)
Spouse returning nurses call Call transferred

## 2022-06-25 NOTE — Telephone Encounter (Signed)
Informed dtr that [name] is out of the office until end of the month. Aware it may be several weeks before a return call. Aware forwarding to his nurse to address this when he returns. Dtr verbalized understanding and agreeable to plan.

## 2022-06-25 NOTE — Telephone Encounter (Signed)
Calling to see if the dr can  recommend a dentist for the patient to have his teeth remove. Please advise

## 2022-06-26 NOTE — Telephone Encounter (Signed)
[name] [name], [place] [address] [address] Teena Dunk DDS MD PA Phone [phone]  I have had patients who have done well with this group.

## 2022-06-28 NOTE — Telephone Encounter (Signed)
Left message to call office

## 2022-06-30 ENCOUNTER — Other Ambulatory Visit (HOSPITAL_BASED_OUTPATIENT_CLINIC_OR_DEPARTMENT_OTHER): Payer: Self-pay

## 2022-07-05 ENCOUNTER — Other Ambulatory Visit (HOSPITAL_BASED_OUTPATIENT_CLINIC_OR_DEPARTMENT_OTHER): Payer: Self-pay

## 2022-07-05 NOTE — Telephone Encounter (Signed)
Left message to call office

## 2022-07-06 NOTE — Telephone Encounter (Signed)
Pt daughter returning a call for recommended dental office. Informed her of Dr. Hassell Done recommendation from 06/26/22.

## 2022-07-08 ENCOUNTER — Other Ambulatory Visit (HOSPITAL_BASED_OUTPATIENT_CLINIC_OR_DEPARTMENT_OTHER): Payer: Self-pay

## 2022-07-12 ENCOUNTER — Other Ambulatory Visit (HOSPITAL_BASED_OUTPATIENT_CLINIC_OR_DEPARTMENT_OTHER): Payer: Self-pay

## 2022-07-12 MED ORDER — AMOXICILLIN 500 MG PO CAPS
2000.0000 mg | ORAL_CAPSULE | Freq: Every day | ORAL | 0 refills | Status: DC
  Filled 2022-07-12: qty 4, 1d supply, fill #0

## 2022-07-15 ENCOUNTER — Telehealth: Payer: Self-pay | Admitting: *Deleted

## 2022-07-15 NOTE — Telephone Encounter (Signed)
   Pre-operative Risk Assessment    Patient Name: [name] [name]  DOB: July 18, 1946 MRN: [id number]     Request for Surgical Clearance    Procedure:  Dental Extraction - Amount of Teeth to be Pulled:  24  Date of Surgery:  Clearance TBD                                 Surgeon:  DR. REHM Surgeon's Group or Practice Name:  [facility] Phone number:  [phone] Fax number:  [phone]   Type of Clearance Requested:   - Medical  - Pharmacy:  Hold Apixaban (Eliquis) NOT INDICATED   Type of Anesthesia:   IV SEDATION WITH VERSED, ATROPINE, DECADRON, FENTANYL, & SODIUM BREVITAL   Additional requests/questions:    Signed, Jeanann Lewandowsky   07/15/2022, 2:06 PM

## 2022-07-15 NOTE — Telephone Encounter (Signed)
   Name: SIGISMUND CROSS  DOB: 1945/12/19  MRN: [id number]  Primary Cardiologist: Larae Grooms, MD  Chart reviewed as part of pre-operative protocol coverage. Because of Hodges Treiber Cedotal's past medical history and time since last visit, he will require a follow-up in-office visit in order to better assess preoperative cardiovascular risk.  Had ER visit 06/16/22 for chest pain. Per notes, initially planned for admission then they spoke with [facility] and felt OK for OP follow-up. This has not yet occurred -> needs visit for both ER f/u and pre-op. Will also need review for any SBE ppx needs at [place].  Pre-op covering staff: - Please schedule appointment and call patient to inform them. If patient already had an upcoming appointment within acceptable timeframe, please add "pre-op clearance" to the appointment notes so provider is aware. - Please contact requesting surgeon's office via preferred method (i.e, phone, fax) to inform them of need for appointment prior to surgery.  This message will also be routed to pharmacy pool for input on holding anticoagulation as requested below so that this information is available to the clearing provider at time of patient's appointment.  Charlie Pitter, PA-C  07/15/2022, 5:35 PM

## 2022-07-16 NOTE — Telephone Encounter (Signed)
I s/w DPR pt's daughter Albina Billet about sooner appt for pt for pre op clearance. Albina Billet states they will keep the appt 09/03/22 with [name] as the procedure is going to cost $10,000.00.  Albina Billet did also ask if [name] may be able to recommend another dental practice as well. I assured Albina Billet I will put this in my notes from [place].   Per Albina Billet we are not going to schedule anything sooner due to the cost of the dental procedure.

## 2022-07-16 NOTE — Telephone Encounter (Signed)
Patient with diagnosis of afib on Eliquis for anticoagulation.    Procedure: 24 dental extractions Date of procedure: TBD  CHA2DS2-VASc Score = 5  This indicates a 7.2% annual risk of stroke. The patient's score is based upon: CHF History: 1 HTN History: 1 Diabetes History: 1 Stroke History: 0 Vascular Disease History: 1 Age Score: 1 Gender Score: 0   CrCl 24m/min Platelet count 159K  Per office protocol, patient can hold Eliquis for 2-3 days prior to procedure. I do not see an indication for SBE ppx but his dentist has prescribed preop amoxicillin for him already.  **This guidance is not considered finalized until pre-operative APP has relayed final recommendations.**

## 2022-07-20 ENCOUNTER — Other Ambulatory Visit (HOSPITAL_BASED_OUTPATIENT_CLINIC_OR_DEPARTMENT_OTHER): Payer: Self-pay

## 2022-07-21 ENCOUNTER — Other Ambulatory Visit (HOSPITAL_BASED_OUTPATIENT_CLINIC_OR_DEPARTMENT_OTHER): Payer: Self-pay

## 2022-07-21 ENCOUNTER — Other Ambulatory Visit: Payer: Self-pay | Admitting: Family Medicine

## 2022-07-21 DIAGNOSIS — G2581 Restless legs syndrome: Secondary | ICD-10-CM

## 2022-07-22 ENCOUNTER — Other Ambulatory Visit (HOSPITAL_BASED_OUTPATIENT_CLINIC_OR_DEPARTMENT_OTHER): Payer: Self-pay

## 2022-07-22 MED ORDER — ATORVASTATIN CALCIUM 80 MG PO TABS
80.0000 mg | ORAL_TABLET | Freq: Every day | ORAL | 0 refills | Status: DC
  Filled 2022-07-22: qty 90, 90d supply, fill #0

## 2022-07-22 MED ORDER — PRAMIPEXOLE DIHYDROCHLORIDE 1 MG PO TABS
1.0000 mg | ORAL_TABLET | Freq: Every day | ORAL | 0 refills | Status: DC
  Filled 2022-07-22: qty 90, 90d supply, fill #0

## 2022-07-23 ENCOUNTER — Other Ambulatory Visit (HOSPITAL_BASED_OUTPATIENT_CLINIC_OR_DEPARTMENT_OTHER): Payer: Self-pay

## 2022-07-26 ENCOUNTER — Telehealth: Payer: Self-pay

## 2022-07-26 NOTE — Telephone Encounter (Signed)
Pt's daughter called in states that pt has two nodules, one on the back of next and other on stomach. Nodule on back of neck has  been there for while but it growing and causing pain. One on stomach has also been there a while it does have some pain but not that bad.

## 2022-07-26 NOTE — Telephone Encounter (Signed)
Patient scheduled to see Dr. Nani Ravens on 07/28/2022.

## 2022-07-28 ENCOUNTER — Ambulatory Visit (INDEPENDENT_AMBULATORY_CARE_PROVIDER_SITE_OTHER): Payer: Medicare Other | Admitting: Family Medicine

## 2022-07-28 ENCOUNTER — Encounter: Payer: Self-pay | Admitting: Family Medicine

## 2022-07-28 ENCOUNTER — Other Ambulatory Visit (HOSPITAL_BASED_OUTPATIENT_CLINIC_OR_DEPARTMENT_OTHER): Payer: Self-pay

## 2022-07-28 VITALS — BP 130/64 | HR 71 | Temp 98.4°F | Ht 67.0 in | Wt 183.4 lb

## 2022-07-28 DIAGNOSIS — L299 Pruritus, unspecified: Secondary | ICD-10-CM | POA: Diagnosis not present

## 2022-07-28 DIAGNOSIS — D489 Neoplasm of uncertain behavior, unspecified: Secondary | ICD-10-CM

## 2022-07-28 DIAGNOSIS — L080 Pyoderma: Secondary | ICD-10-CM | POA: Diagnosis not present

## 2022-07-28 MED ORDER — LEVOCETIRIZINE DIHYDROCHLORIDE 5 MG PO TABS
5.0000 mg | ORAL_TABLET | Freq: Every evening | ORAL | 2 refills | Status: DC
  Filled 2022-07-28: qty 30, 30d supply, fill #0
  Filled 2023-03-08: qty 30, 30d supply, fill #1
  Filled 2023-04-07: qty 30, 30d supply, fill #2

## 2022-07-28 NOTE — Progress Notes (Signed)
Chief Complaint  Patient presents with   nodule on neck and stomach   Rash    [name] [name] is a 76 y.o. male here for a skin complaint.  He is here with his daughter who helps with the history.  Duration: 2 years Location: arms, neck Pruritic? Yes Painful? No Drainage? No New soaps/lotions/topicals/detergents? No Sick contacts? No Other associated symptoms: seems to pop up in new areas Does not affect groin or hands Therapies tried thus far: Topical steroids, freq bathing, washing bedding routinely. Lotion  On the back of his neck over the past few weeks, he has noticed an enlarging mass.  There is no drainage or itching but he is having some discomfort.  No new topicals or close contacts with similar symptoms.  He has not tried anything so far.  Past Medical History:  Diagnosis Date   Acquired atrophy of thyroid 06/05/2013   Overview:  July 2014: controlled on synthroid 27mg since 2012 May 2015: decreased to Synthroid 50 mcg  Last Assessment & Plan:  Pt doing well with most recent TSH within normal range.  Will continue current dosage of Synthroid 560m. Pt reminded to take medication on empty stomach. Will continue to monitor with periodic laboratory assessment in 3 months.    Anemia    hemoglobin 7.4, iron deficiency, January, 2011, 2 unit transfusion, endoscopy normal, capsule endoscopy February, 2011 no small bowel abnormalities.   Most likely source gastric erosions, followed by GI   Anxiety    Bradycardia    CAD (coronary artery disease)    A. CABG in 2000,status post cardiac cath in 2006, 2009 ....continued chest pain and SOB despite oral medication adjestments including Ranexa. B. Cath November 2009/ mRCA - 2.75 x 23 Abbott Xience V drug-eluting stent ...11/26/2008 to distal  RCA leading to acute marginal.  C. Cath 07/2012 for CP - stable anatomy, med rx. d. cath 2015 and 05/30/2015 stable anatomy, consider Myoview if has CP again   Carotid artery disease ([facility]/26/2016    Doppler, May 29, 2015, 1-39% bilateral ICA    Cerebral ischemia    MRI November, 2010, chronic microvascular ischemia   CKD (chronic kidney disease), stage III ([facility]   Concussion    Depression    Bipolar   Diabetes mellitus ([facility]   Edema    Essential hypertension 06/05/2013   Overview:  July 2014: Controlled with Bystolic Sep [id number] to atenolol.  Last Assessment & Plan:  Will change to atenolol for cost and follow Improved.  Medication compliance strongly encouraged BP: 116/64 mmHg    Overview:  July 2014: Controlled with Bystolic Sep [id number] to atenolol.  Last Assessment & Plan:  Will change to atenolol for cost and follow   Falling episodes    these have occurred in the past and again recurring 2011   Family history of adverse reaction to anesthesia    "mother died during bypass surgery but not sure if it has to do with anesthesia"   Gastric ulcer    GERD (gastroesophageal reflux disease)    H/O medication noncompliance    Due to loss of insurance   H/O multiple concussions    Hard of hearing    Heart murmur    History of blood transfusion 12/20/2013   History of kidney stones    Hx of CABG    2000,  / one median sternotomy suture broken her chest x-ray November, 2010, no clinical significance   Hyperlipidemia  Hypertension    pt. denies   Iron deficiency anemia    Long term (current) use of anticoagulants [Z79.01] 07/20/2016   Low back pain 06/12/2009   Qualifier: Diagnosis of  By: [name]    Nephrolithiasis    Orthostasis    OSA (obstructive sleep apnea)    PAF (paroxysmal atrial fibrillation) ([place])    a. dx 2017, started on amiodarone/warfarin but patient intermittently noncompliant.   PSVT (paroxysmal supraventricular tachycardia) ([place])    RBBB 07/09/2009   Qualifier: Diagnosis of  By: [name], MD, [facility], [name]    RLS (restless legs syndrome) 09/19/2009   Qualifier: Diagnosis of  By: [name]   Overview:  July 2014:  Controlled with Mirapex  Last Assessment & Plan:  Patient is doing well. Will continue current management and follow clinically.   Seizure disorder ([place]) 01/09/2017   Spondylosis    C5-6, C6-7 MRI 2010   Syncope 03/2016   TBI (traumatic brain injury) ([place]) 2019   Thyroid disease    Tubulovillous adenoma of colon 2007   Vitamin D deficiency 05/17/2017   Wears glasses     BP 130/64   Pulse 71   Temp 98.4 F (36.9 C) (Oral)   Ht '5\' 7"'$  (1.702 m)   Wt 183 lb 6 oz (83.2 kg)   SpO2 97%   BMI 28.72 kg/m  Gen: awake, alert, appearing stated age Lungs: No accessory muscle use Skin: Circular and raised/rubbery lesion measuring 2 cm in diameter on his posterior neck centrally located over the C2-4 vertebrae.  There are various excoriated papules over the extremities; the hands and groin region are spared. Psych: Age appropriate judgment and insight  Procedure note; punch biopsy  Informed consent was obtained. The area was cleaned with alcohol and then injected with 1.5 mL of 1% lidocaine with epinephrine. 11 blade scalpel was used to make a linear/sagittal incision, no drainage noted.   Then, a 4 mm punch biopsy was used over the same area. The specimen was grasped with forceps and separated with iris scissors. The specimen was placed in a sterile specimen cup and sent to the lab. 1 simple 5-0 Ethilon suture placed. Triple antibiotic ointment and a bandage was placed. There were no complications noted. The patient tolerated the procedure well.  Pruritic dermatitis - Plan: levocetirizine (XYZAL) 5 MG tablet  Neoplasm of uncertain behavior - Plan: Surgical pathology( [place])  This issue has been going on for a long time.  Add oral antihistamine.  Avoid scented products.  Try not to scratch.  His regular PCP has referred him to dermatology.  Some lesions resemble bedbug bites but he does not have a history that fits this. Initially scheduled for an I&D, after the initial  incision, it was clear this is not a cyst.  We took a punch biopsy today and we will send it off for pathology review.  I will see him in 5 days for suture removal.  Aftercare instructions verbalized and written down. The patient and his daughter voiced understanding and agreement to the plan.  [place], DO 07/28/22 4:45 PM

## 2022-07-28 NOTE — Patient Instructions (Addendum)
Do not shower for the rest of the day. When you do wash it, use only soap and water. Do not vigorously scrub. Apply triple antibiotic ointment (like Neosporin) twice daily. Keep the area clean and dry.   Things to look out for: increasing pain not relieved by ice/acetaminophen, fevers, spreading redness, drainage of pus, or foul odor.  Give [place] 1 week to get the results of your biopsy back.  This does not currently look infected to me.   Consider Aquaphor or Aveeno unscented for moisturizing.  Try not to scratch as this can make things worse. Avoid scented products while dealing with this. You may resume when the itchiness resolves. Cold/cool compresses can help.   Let us know if you need anything.

## 2022-08-02 ENCOUNTER — Encounter: Payer: Self-pay | Admitting: Family Medicine

## 2022-08-02 ENCOUNTER — Ambulatory Visit (INDEPENDENT_AMBULATORY_CARE_PROVIDER_SITE_OTHER): Payer: Medicare Other | Admitting: *Deleted

## 2022-08-02 ENCOUNTER — Ambulatory Visit (INDEPENDENT_AMBULATORY_CARE_PROVIDER_SITE_OTHER): Payer: Medicare Other | Admitting: Family Medicine

## 2022-08-02 VITALS — BP 134/67 | HR 60 | Temp 97.8°F | Ht 67.0 in | Wt 180.8 lb

## 2022-08-02 DIAGNOSIS — Z0000 Encounter for general adult medical examination without abnormal findings: Secondary | ICD-10-CM

## 2022-08-02 DIAGNOSIS — D489 Neoplasm of uncertain behavior, unspecified: Secondary | ICD-10-CM

## 2022-08-02 NOTE — Patient Instructions (Signed)
[name] [name] , Thank you for taking time to come for your Medicare Wellness Visit. I appreciate your ongoing commitment to your health goals. Please review the following plan we discussed and let me know if I can assist you in the future.   These are the goals we discussed:  Goals      Go fishing more        This is a list of the screening recommended for you and due dates:  Health Maintenance  Topic Date Due   Zoster (Shingles) Vaccine (1 of 2) Never done   Tetanus Vaccine  12/17/2017   Urine Protein Check  05/13/2020   COVID-19 Vaccine (6 - Pfizer risk series) 12/29/2021   Colon Cancer Screening  05/15/2022   Flu Shot  07/06/2022   Complete foot exam   08/27/2022   Hemoglobin A1C  12/01/2022   Eye exam for diabetics  03/19/2023   Pneumonia Vaccine  Completed   Hepatitis C Screening: USPSTF Recommendation to screen - Ages 32-79 yo.  Completed   HPV Vaccine  Aged Out     Next appointment: Follow up in one year for your annual wellness visit.   Preventive Care 97 Years and Older, Male Preventive care refers to lifestyle choices and visits with your health care provider that can promote health and wellness. What does preventive care include? A yearly physical exam. This is also called an annual well check. Dental exams once or twice a year. Routine eye exams. Ask your health care provider how often you should have your eyes checked. Personal lifestyle choices, including: Daily care of your teeth and gums. Regular physical activity. Eating a healthy diet. Avoiding tobacco and drug use. Limiting alcohol use. Practicing safe sex. Taking low doses of aspirin every day. Taking vitamin and mineral supplements as recommended by your health care provider. What happens during an annual well check? The services and screenings done by your health care provider during your annual well check will depend on your age, overall health, lifestyle risk factors, and family history of  disease. Counseling  Your health care provider may ask you questions about your: Alcohol use. Tobacco use. Drug use. Emotional well-being. Home and relationship well-being. Sexual activity. Eating habits. History of falls. Memory and ability to understand (cognition). Work and work Statistician. Screening  You may have the following tests or measurements: Height, weight, and BMI. Blood pressure. Lipid and cholesterol levels. These may be checked every 5 years, or more frequently if you are over 44 years old. Skin check. Lung cancer screening. You may have this screening every year starting at age 77 if you have a 30-pack-year history of smoking and currently smoke or have quit within the past 15 years. Fecal occult blood test (FOBT) of the stool. You may have this test every year starting at age 75. Flexible sigmoidoscopy or colonoscopy. You may have a sigmoidoscopy every 5 years or a colonoscopy every 10 years starting at age 57. Prostate cancer screening. Recommendations will vary depending on your family history and other risks. Hepatitis C blood test. Hepatitis B blood test. Sexually transmitted disease (STD) testing. Diabetes screening. This is done by checking your blood sugar (glucose) after you have not eaten for a while (fasting). You may have this done every 1-3 years. Abdominal aortic aneurysm (AAA) screening. You may need this if you are a current or former smoker. Osteoporosis. You may be screened starting at age 78 if you are at high risk. Talk with your health care  provider about your test results, treatment options, and if necessary, the need for more tests. Vaccines  Your health care provider may recommend certain vaccines, such as: Influenza vaccine. This is recommended every year. Tetanus, diphtheria, and acellular pertussis (Tdap, Td) vaccine. You may need a Td booster every 10 years. Zoster vaccine. You may need this after age 67. Pneumococcal 13-valent  conjugate (PCV13) vaccine. One dose is recommended after age 25. Pneumococcal polysaccharide (PPSV23) vaccine. One dose is recommended after age 84. Talk to your health care provider about which screenings and vaccines you need and how often you need them. This information is not intended to replace advice given to you by your health care provider. Make sure you discuss any questions you have with your health care provider. Document Released: 12/19/2015 Document Revised: 08/11/2016 Document Reviewed: 09/23/2015 Elsevier Interactive Patient Education  2017 [employer] Prevention in the Home Falls can cause injuries. They can happen to people of all ages. There are many things you can do to make your home safe and to help prevent falls. What can I do on the outside of my home? Regularly fix the edges of walkways and driveways and fix any cracks. Remove anything that might make you trip as you walk through a door, such as a raised step or threshold. Trim any bushes or trees on the path to your home. Use bright outdoor lighting. Clear any walking paths of anything that might make someone trip, such as rocks or tools. Regularly check to see if handrails are loose or broken. Make sure that both sides of any steps have handrails. Any raised decks and porches should have guardrails on the edges. Have any leaves, snow, or ice cleared regularly. Use sand or salt on walking paths during winter. Clean up any spills in your garage right away. This includes oil or grease spills. What can I do in the bathroom? Use night lights. Install grab bars by the toilet and in the tub and shower. Do not use towel bars as grab bars. Use non-skid mats or decals in the tub or shower. If you need to sit down in the shower, use a plastic, non-slip stool. Keep the floor dry. Clean up any water that spills on the floor as soon as it happens. Remove soap buildup in the tub or shower regularly. Attach bath mats  securely with double-sided non-slip rug tape. Do not have throw rugs and other things on the floor that can make you trip. What can I do in the bedroom? Use night lights. Make sure that you have a light by your bed that is easy to reach. Do not use any sheets or blankets that are too big for your bed. They should not hang down onto the floor. Have a firm chair that has side arms. You can use this for support while you get dressed. Do not have throw rugs and other things on the floor that can make you trip. What can I do in the kitchen? Clean up any spills right away. Avoid walking on wet floors. Keep items that you use a lot in easy-to-reach places. If you need to reach something above you, use a strong step stool that has a grab bar. Keep electrical cords out of the way. Do not use floor polish or wax that makes floors slippery. If you must use wax, use non-skid floor wax. Do not have throw rugs and other things on the floor that can make you trip. What can I  do with my stairs? Do not leave any items on the stairs. Make sure that there are handrails on both sides of the stairs and use them. Fix handrails that are broken or loose. Make sure that handrails are as long as the stairways. Check any carpeting to make sure that it is firmly attached to the stairs. Fix any carpet that is loose or worn. Avoid having throw rugs at the top or bottom of the stairs. If you do have throw rugs, attach them to the floor with carpet tape. Make sure that you have a light switch at the top of the stairs and the bottom of the stairs. If you do not have them, ask someone to add them for you. What else can I do to help prevent falls? Wear shoes that: Do not have high heels. Have rubber bottoms. Are comfortable and fit you well. Are closed at the toe. Do not wear sandals. If you use a stepladder: Make sure that it is fully opened. Do not climb a closed stepladder. Make sure that both sides of the stepladder  are locked into place. Ask someone to hold it for you, if possible. Clearly mark and make sure that you can see: Any grab bars or handrails. First and last steps. Where the edge of each step is. Use tools that help you move around (mobility aids) if they are needed. These include: Canes. Walkers. Scooters. Crutches. Turn on the lights when you go into a dark area. Replace any light bulbs as soon as they burn out. Set up your furniture so you have a clear path. Avoid moving your furniture around. If any of your floors are uneven, fix them. If there are any pets around you, be aware of where they are. Review your medicines with your doctor. Some medicines can make you feel dizzy. This can increase your chance of falling. Ask your doctor what other things that you can do to help prevent falls. This information is not intended to replace advice given to you by your health care provider. Make sure you discuss any questions you have with your health care provider. Document Released: 09/18/2009 Document Revised: 04/29/2016 Document Reviewed: 12/27/2014 Elsevier Interactive Patient Education  2017 [employer].

## 2022-08-02 NOTE — Progress Notes (Signed)
CC: F/u  Patient had a biopsy clinic lesion last week.  He is here for a single stitch removal.  The biopsy results are not currently back.  He has another lesion on the abdominal region that intermittently is bothersome.  He denies any fevers, redness, drainage, or foul odor.  He was compliant with keeping the area clean/dry and using triple antibiotic ointment.  1 simple suture removed.  Further aftercare instructions verbalized.  Follow-up as needed, we will be in touch regarding his biopsy results.  The patient voiced understanding and agreement with the plan.  Crosby Oyster Jovanka [place] 3:12 PM 08/02/22

## 2022-08-02 NOTE — Progress Notes (Signed)
Subjective:   [name] [name] is a 76 y.o. male who presents for Medicare Annual/Subsequent preventive examination.  Review of Systems    Defer to PCP       Objective:    Today's Vitals   08/02/22 1452  BP: 134/67  Pulse: 60  Temp: 97.8 F (36.6 C)  Height: '5\' 7"'$  (1.702 m)   Body mass index is 28.72 kg/m.     08/02/2022    3:01 PM 06/16/2022   11:48 AM 02/13/2022    1:07 AM 02/12/2022    2:40 PM 09/22/2021    3:37 PM 08/30/2021    2:56 PM 07/08/2021    8:55 AM  Advanced Directives  Does Patient Have a Medical Advance Directive? No No No No Yes No No  Type of Advance Directive     Living will    Does patient want to make changes to medical advance directive?     No - Patient declined    Would patient like information on creating a medical advance directive? No - Patient declined No - Patient declined No - Patient declined No - Patient declined   Yes (MAU/Ambulatory/Procedural Areas - Information given)    Current Medications (verified) Outpatient Encounter Medications as of 08/02/2022  Medication Sig   amoxicillin (AMOXIL) 500 MG capsule Take 4 capsules (2,000 mg total) by mouth 1 hour before dental procedure.   apixaban (ELIQUIS) 5 MG TABS tablet Take 1 tablet (5 mg total) by mouth 2 (two) times daily.   aspirin EC 81 MG tablet Take 81 mg by mouth daily. Swallow whole.   atorvastatin (LIPITOR) 80 MG tablet Take 1 tablet (80 mg total) by mouth daily.   BESIVANCE 0.6 % SUSP Instill 1 drop into right eye three times a day as directed. Please store upside down!!   Continuous Blood Gluc Receiver (FREESTYLE LIBRE 2 READER) DEVI Use as directed   Continuous Blood Gluc Sensor (FREESTYLE LIBRE 2 SENSOR) MISC Use as instructed to check blood sugar, change every 14 days   Diclofenac Sodium (PENNSAID) 2 % SOLN Place 1 application onto the skin 2 (two) times daily. (Patient taking differently: Place 1 application  onto the skin 2 (two) times daily as needed (pain).)   divalproex  (DEPAKOTE) 500 MG DR tablet Take 1 tablet (500 mg total) by mouth 2 (two) times daily.   donepezil (ARICEPT) 10 MG tablet Take 1 tablet (10 mg total) by mouth at bedtime.   fenofibrate 160 MG tablet Take 1 tablet (160 mg total) by mouth daily.   glucose blood (ACCU-CHEK AVIVA) test strip Use as directed to check blood sugar 4 times daily.  DX E11.9 (Patient taking differently: Use as directed to check blood sugar 4 times daily.  DX E11.9)   hydrocortisone 2.5 % cream Apply on to the skin 2 (two) times daily.   hydrOXYzine (ATARAX) 10 MG tablet Take 1 tablet (10 mg total) by mouth 3 (three) times daily as needed for anxiety or itching.   insulin glargine, 2 Unit Dial, (TOUJEO MAX SOLOSTAR) 300 UNIT/ML Solostar Pen Inject 65 Units into the skin daily in the afternoon.   insulin lispro (HUMALOG KWIKPEN) 100 UNIT/ML KwikPen Inject 10 Units into the skin 3 (three) times daily.   Insulin Pen Needle 31G X 5 MM MISC Use as directed to inject insulin in the morning, at noon, in the evening, and at bedtime.   isosorbide mononitrate (IMDUR) 120 MG 24 hr tablet Take 1 tablet (120 mg total) by mouth  daily.   ketorolac (ACULAR) 0.5 % ophthalmic solution Place 1 drop into the right eye 4 times a day as directed   levocetirizine (XYZAL) 5 MG tablet Take 1 tablet (5 mg total) by mouth every evening.   levothyroxine (SYNTHROID) 100 MCG tablet TAKE 1 TABLET (100 MCG TOTAL) BY MOUTH DAILY.   meclizine (ANTIVERT) 25 MG tablet Take 1 tablet (25 mg total) by mouth 3 (three) times daily as needed for dizziness.   nitroGLYCERIN (NITROSTAT) 0.4 MG SL tablet PLACE 1 TABLET UNDER THE TONGUE EVERY 5 MINUTES AS NEEDED FOR CHEST PAIN.   pantoprazole (PROTONIX) 40 MG tablet TAKE 1 TABLET BY MOUTH EVERY DAY   permethrin (ELIMITE) 5 % cream Apply cream head to bottom soles of feet at bedtime and wash off in morning, can repeat once in 1-2 weeks   pramipexole (MIRAPEX) 1 MG tablet Take 1/2 to 1 tablet by mouth at bedtime    prednisoLONE acetate (PRED FORTE) 1 % ophthalmic suspension Place 1 drop into the right eye 4 times a day as directed. Please shake bottle well before each use.   ranolazine (RANEXA) 500 MG 12 hr tablet TAKE 1 TABLET BY MOUTH TWICE DAILY   sertraline (ZOLOFT) 100 MG tablet TAKE 1 TABLET BY MOUTH TWICE DAILY   tiZANidine (ZANAFLEX) 4 MG tablet Take 1 tablet (4 mg total) by mouth at night for spasms and pain.   triamcinolone cream (KENALOG) 0.1 % Apply 1 application topically 2 (two) times daily. Use as needed for itchy rash   Vitamin D, Ergocalciferol, (DRISDOL) 1.25 MG (50000 UNIT) CAPS capsule TAKE 1 CAPSULE BY MOUTH EVERY 7 DAYS   No facility-administered encounter medications on file as of 08/02/2022.    Allergies (verified) Morphine and Shellfish-derived products   History: Past Medical History:  Diagnosis Date   Acquired atrophy of thyroid 06/05/2013   Overview:  July 2014: controlled on synthroid 33mg since 2012 May 2015: decreased to Synthroid 50 mcg  Last Assessment & Plan:  Pt doing well with most recent TSH within normal range.  Will continue current dosage of Synthroid 544m. Pt reminded to take medication on empty stomach. Will continue to monitor with periodic laboratory assessment in 3 months.    Anemia    hemoglobin 7.4, iron deficiency, January, 2011, 2 unit transfusion, endoscopy normal, capsule endoscopy February, 2011 no small bowel abnormalities.   Most likely source gastric erosions, followed by GI   Anxiety    Bradycardia    CAD (coronary artery disease)    A. CABG in 2000,status post cardiac cath in 2006, 2009 ....continued chest pain and SOB despite oral medication adjestments including Ranexa. B. Cath November 2009/ mRCA - 2.75 x 23 Abbott Xience V drug-eluting stent ...11/26/2008 to distal  RCA leading to acute marginal.  C. Cath 07/2012 for CP - stable anatomy, med rx. d. cath 2015 and 05/30/2015 stable anatomy, consider Myoview if has CP again   Carotid artery  disease ([facility]08/26/2016   Doppler, May 29, 2015, 1-39% bilateral ICA    Cerebral ischemia    MRI November, 2010, chronic microvascular ischemia   CKD (chronic kidney disease), stage III ([facility]   Concussion    Depression    Bipolar   Diabetes mellitus ([facility] [place]   Edema    Essential hypertension 06/05/2013   Overview:  July 2014: Controlled with Bystolic Sep [id number] to atenolol.  Last Assessment & Plan:  Will change to atenolol for cost and follow Improved.  Medication compliance strongly  encouraged BP: 116/64 mmHg    Overview:  July 2014: Controlled with Bystolic Sep 8144: Changed to atenolol.  Last Assessment & Plan:  Will change to atenolol for cost and follow   Falling episodes    these have occurred in the past and again recurring 2011   Family history of adverse reaction to anesthesia    "mother died during bypass surgery but not sure if it has to do with anesthesia"   Gastric ulcer    GERD (gastroesophageal reflux disease)    H/O medication noncompliance    Due to loss of insurance   H/O multiple concussions    Hard of hearing    Heart murmur    History of blood transfusion 12/20/2013   History of kidney stones    Hx of CABG    2000,  / one median sternotomy suture broken her chest x-ray November, 2010, no clinical significance   Hyperlipidemia    Hypertension    pt. denies   Iron deficiency anemia    Long term (current) use of anticoagulants [Z79.01] 07/20/2016   Low back pain 06/12/2009   Qualifier: Diagnosis of  By: [name]    Nephrolithiasis    Orthostasis    OSA (obstructive sleep apnea)    PAF (paroxysmal atrial fibrillation) ([place])    a. dx 2017, started on amiodarone/warfarin but patient intermittently noncompliant.   PSVT (paroxysmal supraventricular tachycardia) ([place])    RBBB 07/09/2009   Qualifier: Diagnosis of  By: [name], MD, [facility], [name]    RLS (restless legs syndrome) 09/19/2009   Qualifier: Diagnosis of  By: [name]    Overview:  July 2014: Controlled with Mirapex  Last Assessment & Plan:  Patient is doing well. Will continue current management and follow clinically.   Seizure disorder ([place]) 01/09/2017   Spondylosis    C5-6, C6-7 MRI 2010   Syncope 03/2016   TBI (traumatic brain injury) ([place]) 2019   Thyroid disease    Tubulovillous adenoma of colon 2007   Vitamin D deficiency 05/17/2017   Wears glasses    Past Surgical History:  Procedure Laterality Date   ANTERIOR CERVICAL DECOMP/DISCECTOMY FUSION N/A 05/31/2016   Procedure: ANTERIOR CERVICAL DECOMPRESSION/DISCECTOMY FUSION CERVICAL FIVE-SIX,CERVICAL SIX-SEVEN;  Surgeon: [name], MD;  Location: [place] NEURO ORS;  Service: Neurosurgery;  Laterality: N/A;   CARDIAC CATHETERIZATION N/A 05/30/2015   Procedure: Left Heart Cath and Coronary Angiography;  Surgeon: [name], MD;  Location: [place] CV LAB;  Service: Cardiovascular;  Laterality: N/A;   COLONOSCOPY     CORONARY ARTERY BYPASS GRAFT     2000   ESOPHAGOGASTRODUODENOSCOPY     LEFT HEART CATH AND CORS/GRAFTS ANGIOGRAPHY N/A 12/15/2017   Procedure: LEFT HEART CATH AND CORS/GRAFTS ANGIOGRAPHY;  Surgeon: [name], MD;  Location: [place] CV LAB;  Service: Cardiovascular;  Laterality: N/A;   LEFT HEART CATHETERIZATION WITH CORONARY/GRAFT ANGIOGRAM N/A 08/01/2012   Procedure: LEFT HEART CATHETERIZATION WITH Beatrix Fetters;  Surgeon: [name], MD;  [facility];  Service: Cardiovascular;  Laterality: N/A;   LEFT HEART CATHETERIZATION WITH CORONARY/GRAFT ANGIOGRAM N/A 01/03/2015   Procedure: LEFT HEART CATHETERIZATION WITH Beatrix Fetters;  Surgeon: [name], MD;  Location: [facility] CATH LAB;  Service: Cardiovascular;  Laterality: N/A;   NASAL SEPTUM SURGERY     UP3   PERCUTANEOUS CORONARY STENT INTERVENTION (PCI-S)  10/2008   mRCA PCI  2.75 x 23 Abbott Xience V drug-eluting stent  RIGHT/LEFT HEART CATH AND CORONARY/GRAFT ANGIOGRAPHY N/A 01/07/2020    Procedure: RIGHT/LEFT HEART CATH AND CORONARY/GRAFT ANGIOGRAPHY;  Surgeon: [name], MD;  Location: [place] CV LAB;  Service: Cardiovascular;  Laterality: N/A;   TOTAL KNEE ARTHROPLASTY Right 09/22/2021   Procedure: RIGHT TOTAL KNEE ARTHROPLASTY;  Surgeon: [name], MD;  Location: [facility];  Service: Orthopedics;  Laterality: Right;   ULTRASOUND GUIDANCE FOR VASCULAR ACCESS  12/15/2017   Procedure: Ultrasound Guidance For Vascular Access;  Surgeon: [name], MD;  Location: [place] CV LAB;  Service: Cardiovascular;;   Family History  Problem Relation Age of Onset   Pancreatic cancer Brother    Diabetes Brother    Coronary artery disease Brother    Stroke Brother    Diabetes Brother    Diabetes Mother    Heart failure Mother    Heart failure Father    Hypothyroidism Brother    Coronary artery disease Brother    Other Brother        colon surgery   Heart attack Other        Nephew   Irregular heart beat Daughter    Cancer Maternal Grandmother        unknown    Colon cancer Neg Hx    Stomach cancer Neg Hx    Liver cancer Neg Hx    Rectal cancer Neg Hx    Esophageal cancer Neg Hx    Social History   Socioeconomic History   Marital status: Married    Spouse name: Not on file   Number of children: 4   Years of education: 54   Highest education level: Not on file  Occupational History   Occupation: retired  Tobacco Use   Smoking status: Never    Passive exposure: Never   Smokeless tobacco: Never  Vaping Use   Vaping Use: Never used  Substance and Sexual Activity   Alcohol use: No    Alcohol/week: 0.0 standard drinks of alcohol    Comment: stopped drinking in 1998   Drug use: No   Sexual activity: Not Currently    Partners: Female  Other Topics Concern   Not on file  Social History Narrative   Patient is right handed.   Patient does not drink caffeine.   Social Determinants of Health   Financial Resource Strain: Medium Risk  (08/02/2022)   Overall Financial Resource Strain (CARDIA)    Difficulty of Paying Living Expenses: Somewhat hard  Food Insecurity: No Food Insecurity (08/02/2022)   Hunger Vital Sign    Worried About Running Out of Food in the Last Year: Never true    Ran Out of Food in the Last Year: Never true  Transportation Needs: No Transportation Needs (08/02/2022)   PRAPARE - Hydrologist (Medical): No    Lack of Transportation (Non-Medical): No  Physical Activity: Insufficiently Active (08/02/2022)   Exercise Vital Sign    Days of Exercise per Week: 7 days    Minutes of Exercise per Session: 20 min  Stress: Stress Concern Present (08/02/2022)   [place]    Feeling of Stress : To some extent  Social Connections: Moderately Isolated (08/02/2022)   Social Connection and Isolation Panel [NHANES]    Frequency of Communication with Friends and Family: More than three times a week    Frequency of Social Gatherings with Friends and Family: More than three times a week    Attends  Religious Services: More than 4 times per year    Active Member of Clubs or Organizations: No    Attends Archivist Meetings: Never    Marital Status: Widowed    Tobacco Counseling Counseling given: Not Answered   Clinical Intake:  Pre-visit preparation completed: Yes  Pain : No/denies pain     Nutritional Risks: None Diabetes: Yes CBG done?: No Did pt. bring in CBG monitor from home?: No  How often do you need to have someone help you when you read instructions, pamphlets, or other written materials from your doctor or pharmacy?: 1 - Never  Diabetic? Yes Nutrition Risk Assessment:  Has the patient had any N/V/D within the last 2 months?  No  Does the patient have any non-healing wounds?  No  Has the patient had any unintentional weight loss or weight gain?  No   Diabetes:  Is the patient diabetic?  Yes   If diabetic, was a CBG obtained today?  No  Did the patient bring in their glucometer from home?  No  How often do you monitor your CBG's? 3-4 times day.   Financial Strains and Diabetes Management:  Are you having any financial strains with the device, your supplies or your medication? No .  Does the patient want to be seen by Chronic Care Management for management of their diabetes?  Yes  Would the patient like to be referred to a Nutritionist or for Diabetic Management?  No   Diabetic Exams:  Diabetic Eye Exam: Completed 03/18/22 Diabetic Foot Exam: Completed 08/27/21    Interpreter Needed?: No  Information entered by :: Beatris Ship, [place]   Activities of Daily Living    02/13/2022    1:09 AM 02/13/2022    1:08 AM  In your present state of health, do you have any difficulty performing the following activities:  Hearing?  0  Vision?  0  Difficulty concentrating or making decisions?  0  Walking or climbing stairs?  1  Dressing or bathing?  0  Doing errands, shopping? 0     Patient Care Team: [name], MD as PCP - General (Family Medicine) [name], MD as PCP - Cardiology (Cardiology) [name], MD as Consulting Physician (Cardiology) Deboraha Sprang, MD as Consulting Physician (Cardiology) [place], Mike Gip, MD (Inactive) as Consulting Physician (Pulmonary Disease) Warden Fillers, MD as Consulting Physician (Ophthalmology) Kathrynn Ducking, MD (Inactive) (Neurology) Renato Shin, MD (Inactive) as Consulting Physician (Endocrinology)  Indicate any recent Medical Services you may have received from other than [facility] providers in the past year (date may be approximate).     Assessment:   This is a routine wellness examination for Odie.  Hearing/Vision screen No results found.  Dietary issues and exercise activities discussed:     Goals Addressed             This Visit's Progress    Go fishing more   Not on track       Depression Screen    08/02/2022    3:09 PM 04/27/2022    8:53 AM 06/05/2021    3:37 PM 11/20/2020    1:36 PM 04/21/2017    8:23 AM 03/17/2017    3:56 PM 12/30/2016    4:07 PM  PHQ 2/9 Scores  PHQ - 2 Score 6 0 0 0 0 1 0  PHQ- 9 Score 19 0 0        Fall Risk    08/02/2022  3:02 PM 04/27/2022    8:52 AM 10/05/2021    4:11 PM 07/07/2021   10:26 AM 11/20/2020    1:36 PM  Fall Risk   Falls in the past year? 1 0 '1 1 1  '$ Number falls in past yr: 1 0 '1 1 1  '$ Injury with Fall? 0 0 0 0 0  Risk for fall due to : History of fall(s) Other (Comment) Impaired balance/gait;History of fall(s)    Risk for fall due to: Comment  Narc     Follow up Falls evaluation completed Falls evaluation completed Falls evaluation completed      [place]:  Any stairs in or around the home? Yes  If so, are there any without handrails? No  Home free of loose throw rugs in walkways, pet beds, electrical cords, etc? Yes  Adequate lighting in your home to reduce risk of falls? Yes   ASSISTIVE DEVICES UTILIZED TO PREVENT FALLS:  Life alert? No  Use of a cane, walker or w/c? Yes  Grab bars in the bathroom? No  Shower chair or bench in shower? Yes  Elevated toilet seat or a handicapped toilet? No   TIMED UP AND GO:  Was the test performed? Yes .  Length of time to ambulate 10 feet: 10 sec.   Gait steady and fast without use of assistive device  Cognitive Function:    07/07/2021   10:16 AM 04/21/2017   11:00 AM 01/21/2017    9:44 AM 01/15/2016    3:03 PM 02/18/2015   10:38 AM  MMSE - Mini Mental State Exam  Not completed:  Unable to complete     Orientation to time '4  4 5 5  '$ Orientation to Place '4  5 4 5  '$ Registration '3  3 3 3  '$ Attention/ Calculation 1  0 0 2  Recall '3  3 2 3  '$ Language- name 2 objects '2  2 2 2  '$ Language- repeat 0  '1 1 1  '$ Language- follow 3 step command '3  3 3 3  '$ Language- read & follow direction '1  1 1 1  '$ Write a sentence '1  1 1 1  '$ Copy design 0  '1  1 1  '$ Total score '22  24 23 27        '$ Immunizations Immunization History  Administered Date(s) Administered   Fluad Quad(high Dose 65+) 07/31/2019, 11/03/2021   Influenza Split 09/21/2012   Influenza Whole 10/16/2007, 10/01/2008, 09/23/2009, 09/22/2010   Influenza, High Dose Seasonal PF 10/08/2013, 08/28/2014, 09/08/2017, 08/08/2018, 09/12/2020   Influenza,inj,Quad PF,6+ Mos 08/22/2015, 08/22/2015, 08/24/2016   Influenza-Unspecified 09/05/2012, 09/04/2014   PFIZER Comirnaty(Gray Top)Covid-19 Tri-Sucrose Vaccine 05/08/2021   PFIZER(Purple Top)SARS-COV-2 Vaccination 01/29/2020, 02/19/2020, 09/12/2020   Pfizer Covid-19 Vaccine Bivalent Booster 74yr & up 11/03/2021   Pneumococcal Conjugate-13 01/23/2014, 08/24/2016   Pneumococcal Polysaccharide-23 12/18/2008, 01/03/2015   Td 12/18/2007   Zoster, Live 11/25/2010, 12/07/2011, 11/30/2012    TDAP status: Due, Education has been provided regarding the importance of this vaccine. Advised may receive this vaccine at local pharmacy or Health Dept. Aware to provide a copy of the vaccination record if obtained from local pharmacy or Health Dept. Verbalized acceptance and understanding.  Flu Vaccine status: Up to date  Pneumococcal vaccine status: Up to date  Covid-19 vaccine status: Information provided on how to obtain vaccines.   Qualifies for Shingles Vaccine? Yes   Zostavax completed Yes   Shingrix Completed?: No.    Education has been  provided regarding the importance of this vaccine. Patient has been advised to call insurance company to determine out of pocket expense if they have not yet received this vaccine. Advised may also receive vaccine at local pharmacy or Health Dept. Verbalized acceptance and understanding.  Screening Tests Health Maintenance  Topic Date Due   Zoster Vaccines- Shingrix (1 of 2) Never done   TETANUS/TDAP  12/17/2017   URINE MICROALBUMIN  05/13/2020   COVID-19 Vaccine (6 - Pfizer risk series) 12/29/2021    COLONOSCOPY (Pts 45-73yr Insurance coverage will need to be confirmed)  05/15/2022   INFLUENZA VACCINE  07/06/2022   FOOT EXAM  08/27/2022   HEMOGLOBIN A1C  12/01/2022   OPHTHALMOLOGY EXAM  03/19/2023   Pneumonia Vaccine 76 Years old  Completed   Hepatitis C Screening  Completed   HPV VACCINES  Aged Out    Health Maintenance  Health Maintenance Due  Topic Date Due   Zoster Vaccines- Shingrix (1 of 2) Never done   TETANUS/TDAP  12/17/2017   URINE MICROALBUMIN  05/13/2020   COVID-19 Vaccine (6 - Pfizer risk series) 12/29/2021   COLONOSCOPY (Pts 45-475yrInsurance coverage will need to be confirmed)  05/15/2022   INFLUENZA VACCINE  07/06/2022    Colorectal cancer screening: Type of screening: Colonoscopy. Completed 05/16/19. Repeat every 3 years  Lung Cancer Screening: (Low Dose CT Chest recommended if Age 74-80ears, 30 pack-year currently smoking OR have quit w/in 15years.) does not qualify.   Lung Cancer Screening Referral: N/a  Additional Screening:  Hepatitis C Screening: does qualify; Completed 04/24/14  Vision Screening: Recommended annual ophthalmology exams for early detection of glaucoma and other disorders of the eye. Is the patient up to date with their annual eye exam?  Yes  Who is the provider or what is the name of the office in which the patient attends annual eye exams? Su[facility]yf pt is not established with a provider, would they like to be referred to a provider to establish care? No .   Dental Screening: Recommended annual dental exams for proper oral hygiene. Yes  Community Resource Referral / Chronic Care Management: CRR required this visit?  No   CCM required this visit?  Yes      Plan:     I have personally reviewed and noted the following in the patient's chart:   Medical and social history Use of alcohol, tobacco or illicit drugs  Current medications and supplements including opioid prescriptions. Patient is not currently  taking opioid prescriptions. Functional ability and status Nutritional status Physical activity Advanced directives List of other physicians Hospitalizations, surgeries, and ER visits in previous 12 months Vitals Screenings to include cognitive, depression, and falls Referrals and appointments  In addition, I have reviewed and discussed with patient certain preventive protocols, quality metrics, and best practice recommendations. A written personalized care plan for preventive services as well as general preventive health recommendations were provided to patient.     BeBeatris ShipCM[place] 08/02/2022   Nurse Notes: None

## 2022-08-05 ENCOUNTER — Telehealth: Payer: Medicare Other | Admitting: Family Medicine

## 2022-08-05 ENCOUNTER — Other Ambulatory Visit (HOSPITAL_BASED_OUTPATIENT_CLINIC_OR_DEPARTMENT_OTHER): Payer: Self-pay

## 2022-08-05 NOTE — Progress Notes (Shared)
Subjective:   By signing my name below, I, [name], attest that this documentation has been prepared under the direction and in the presence of [name], MD 08/05/2022.     Patient ID: [name] [name], male    DOB: 1946-03-20, 76 y.o.   MRN: [id number]  No chief complaint on file.  HPI Patient is in today for an office visit.   Past Medical History:  Diagnosis Date   Acquired atrophy of thyroid 06/05/2013   Overview:  July 2014: controlled on synthroid 84mg since 2012 May 2015: decreased to Synthroid 50 mcg  Last Assessment & Plan:  Pt doing well with most recent TSH within normal range.  Will continue current dosage of Synthroid 524m. Pt reminded to take medication on empty stomach. Will continue to monitor with periodic laboratory assessment in 3 months.    Anemia    hemoglobin 7.4, iron deficiency, January, 2011, 2 unit transfusion, endoscopy normal, capsule endoscopy February, 2011 no small bowel abnormalities.   Most likely source gastric erosions, followed by GI   Anxiety    Bradycardia    CAD (coronary artery disease)    A. CABG in 2000,status post cardiac cath in 2006, 2009 ....continued chest pain and SOB despite oral medication adjestments including Ranexa. B. Cath November 2009/ mRCA - 2.75 x 23 Abbott Xience V drug-eluting stent ...11/26/2008 to distal  RCA leading to acute marginal.  C. Cath 07/2012 for CP - stable anatomy, med rx. d. cath 2015 and 05/30/2015 stable anatomy, consider Myoview if has CP again   Carotid artery disease ([facility]08/26/2016   Doppler, May 29, 2015, 1-39% bilateral ICA    Cerebral ischemia    MRI November, 2010, chronic microvascular ischemia   CKD (chronic kidney disease), stage III ([facility]   Concussion    Depression    Bipolar   Diabetes mellitus ([facility]   Edema    Essential hypertension 06/05/2013   Overview:  July 2014: Controlled with Bystolic Sep [id number] to atenolol.  Last Assessment & Plan:  Will change to atenolol  for cost and follow Improved.  Medication compliance strongly encouraged BP: 116/64 mmHg    Overview:  July 2014: Controlled with Bystolic Sep [id number] to atenolol.  Last Assessment & Plan:  Will change to atenolol for cost and follow   Falling episodes    these have occurred in the past and again recurring 2011   Family history of adverse reaction to anesthesia    "mother died during bypass surgery but not sure if it has to do with anesthesia"   Gastric ulcer    GERD (gastroesophageal reflux disease)    H/O medication noncompliance    Due to loss of insurance   H/O multiple concussions    Hard of hearing    Heart murmur    History of blood transfusion 12/20/2013   History of kidney stones    Hx of CABG    2000,  / one median sternotomy suture broken her chest x-ray November, 2010, no clinical significance   Hyperlipidemia    Hypertension    pt. denies   Iron deficiency anemia    Long term (current) use of anticoagulants [Z79.01] 07/20/2016   Low back pain 06/12/2009   Qualifier: Diagnosis of  By: [name]  Nephrolithiasis    Orthostasis    OSA (obstructive sleep apnea)    PAF (paroxysmal atrial fibrillation) ([facility]   a. dx 2017, started on  amiodarone/warfarin but patient intermittently noncompliant.   PSVT (paroxysmal supraventricular tachycardia) ([place])    RBBB 07/09/2009   Qualifier: Diagnosis of  By: [name], MD, [facility], [name]    RLS (restless legs syndrome) 09/19/2009   Qualifier: Diagnosis of  By: [name]   Overview:  July 2014: Controlled with Mirapex  Last Assessment & Plan:  Patient is doing well. Will continue current management and follow clinically.   Seizure disorder ([name]) 01/09/2017   Spondylosis    C5-6, C6-7 MRI 2010   Syncope 03/2016   TBI (traumatic brain injury) ([name]) 2019   Thyroid disease    Tubulovillous adenoma of colon 2007   Vitamin D deficiency 05/17/2017   Wears glasses    Past Surgical History:  Procedure Laterality  Date   ANTERIOR CERVICAL DECOMP/DISCECTOMY FUSION N/A 05/31/2016   Procedure: ANTERIOR CERVICAL DECOMPRESSION/DISCECTOMY FUSION CERVICAL FIVE-SIX,CERVICAL SIX-SEVEN;  Surgeon: [name], MD;  Location: [facility];  Service: Neurosurgery;  Laterality: N/A;   CARDIAC CATHETERIZATION N/A 05/30/2015   Procedure: Left Heart Cath and Coronary Angiography;  Surgeon: [name], MD;  Location: [place] [facility];  Service: Cardiovascular;  Laterality: N/A;   COLONOSCOPY     CORONARY ARTERY BYPASS GRAFT     2000   ESOPHAGOGASTRODUODENOSCOPY     LEFT HEART CATH AND CORS/GRAFTS ANGIOGRAPHY N/A 12/15/2017   Procedure: LEFT HEART CATH AND CORS/GRAFTS ANGIOGRAPHY;  Surgeon: [name], MD;  Location: [place] CV LAB;  Service: Cardiovascular;  Laterality: N/A;   LEFT HEART CATHETERIZATION WITH CORONARY/GRAFT ANGIOGRAM N/A 08/01/2012   Procedure: LEFT HEART CATHETERIZATION WITH Beatrix Fetters;  Surgeon: [name], MD;  Location: [facility] LAB;  Service: Cardiovascular;  Laterality: N/A;   LEFT HEART CATHETERIZATION WITH CORONARY/GRAFT ANGIOGRAM N/A 01/03/2015   Procedure: LEFT HEART CATHETERIZATION WITH Beatrix Fetters;  Surgeon: [name], MD;  Location: [facility] CATH LAB;  Service: Cardiovascular;  Laterality: N/A;   NASAL SEPTUM SURGERY     UP3   PERCUTANEOUS CORONARY STENT INTERVENTION (PCI-S)  10/2008   mRCA PCI  2.75 x 23 Abbott Xience V drug-eluting stent    RIGHT/LEFT HEART CATH AND CORONARY/GRAFT ANGIOGRAPHY N/A 01/07/2020   Procedure: RIGHT/LEFT HEART CATH AND CORONARY/GRAFT ANGIOGRAPHY;  Surgeon: [name], MD;  Location: [place] CV LAB;  Service: Cardiovascular;  Laterality: N/A;   TOTAL KNEE ARTHROPLASTY Right 09/22/2021   Procedure: RIGHT TOTAL KNEE ARTHROPLASTY;  Surgeon: [name], MD;  Location: [facility];  Service: Orthopedics;  Laterality: Right;   ULTRASOUND GUIDANCE FOR VASCULAR ACCESS  12/15/2017   Procedure: Ultrasound Guidance For  Vascular Access;  Surgeon: [name], MD;  Location: [place] CV LAB;  Service: Cardiovascular;;   Family History  Problem Relation Age of Onset   Pancreatic cancer Brother    Diabetes Brother    Coronary artery disease Brother    Stroke Brother    Diabetes Brother    Diabetes Mother    Heart failure Mother    Heart failure Father    Hypothyroidism Brother    Coronary artery disease Brother    Other Brother        colon surgery   Heart attack Other        Nephew   Irregular heart beat Daughter    Cancer Maternal Grandmother        unknown    Colon cancer Neg Hx    Stomach cancer Neg Hx    Liver cancer Neg Hx  Rectal cancer Neg Hx    Esophageal cancer Neg Hx    Social History   Socioeconomic History   Marital status: Married    Spouse name: Not on file   Number of children: 4   Years of education: 13   Highest education level: Not on file  Occupational History   Occupation: retired  Tobacco Use   Smoking status: Never    Passive exposure: Never   Smokeless tobacco: Never  Vaping Use   Vaping Use: Never used  Substance and Sexual Activity   Alcohol use: No    Alcohol/week: 0.0 standard drinks of alcohol    Comment: stopped drinking in 1998   Drug use: No   Sexual activity: Not Currently    Partners: Female  Other Topics Concern   Not on file  Social History Narrative   Patient is right handed.   Patient does not drink caffeine.   Social Determinants of Health   Financial Resource Strain: Medium Risk (08/02/2022)   Overall Financial Resource Strain (CARDIA)    Difficulty of Paying Living Expenses: Somewhat hard  Food Insecurity: No Food Insecurity (08/02/2022)   Hunger Vital Sign    Worried About Running Out of Food in the Last Year: Never true    Ran Out of Food in the Last Year: Never true  Transportation Needs: No Transportation Needs (08/02/2022)   PRAPARE - Hydrologist (Medical): No    Lack of  Transportation (Non-Medical): No  Physical Activity: Insufficiently Active (08/02/2022)   Exercise Vital Sign    Days of Exercise per Week: 7 days    Minutes of Exercise per Session: 20 min  Stress: Stress Concern Present (08/02/2022)   [place]    Feeling of Stress : To some extent  Social Connections: Moderately Isolated (08/02/2022)   Social Connection and Isolation Panel [NHANES]    Frequency of Communication with Friends and Family: More than three times a week    Frequency of Social Gatherings with Friends and Family: More than three times a week    Attends Religious Services: More than 4 times per year    Active Member of [employer] or Organizations: No    Attends Archivist Meetings: Never    Marital Status: Widowed  Intimate Partner Violence: Not At Risk (08/02/2022)   Humiliation, Afraid, Rape, and Kick questionnaire    Fear of Current or Ex-Partner: No    Emotionally Abused: No    Physically Abused: No    Sexually Abused: No   Outpatient Medications Prior to Visit  Medication Sig Dispense Refill   amoxicillin (AMOXIL) 500 MG capsule Take 4 capsules (2,000 mg total) by mouth 1 hour before dental procedure. 4 capsule 0   apixaban (ELIQUIS) 5 MG TABS tablet Take 1 tablet (5 mg total) by mouth 2 (two) times daily. 60 tablet 5   aspirin EC 81 MG tablet Take 81 mg by mouth daily. Swallow whole.     atorvastatin (LIPITOR) 80 MG tablet Take 1 tablet (80 mg total) by mouth daily. 90 tablet 0   BESIVANCE 0.6 % SUSP Instill 1 drop into right eye three times a day as directed. Please store upside down!! 5 mL 1   Continuous Blood Gluc Receiver (FREESTYLE LIBRE 2 READER) DEVI Use as directed 1 each 1   Continuous Blood Gluc Sensor (FREESTYLE LIBRE 2 SENSOR) MISC Use as instructed to check blood sugar, change every 14 days  6 each 3   Diclofenac Sodium (PENNSAID) 2 % SOLN Place 1 application onto the skin 2 (two) times  daily. (Patient taking differently: Place 1 application  onto the skin 2 (two) times daily as needed (pain).) 112 g 2   divalproex (DEPAKOTE) 500 MG DR tablet Take 1 tablet (500 mg total) by mouth 2 (two) times daily. 60 tablet 5   donepezil (ARICEPT) 10 MG tablet Take 1 tablet (10 mg total) by mouth at bedtime. 90 tablet 4   fenofibrate 160 MG tablet Take 1 tablet (160 mg total) by mouth daily. 30 tablet 0   glucose blood (ACCU-CHEK AVIVA) test strip Use as directed to check blood sugar 4 times daily.  DX E11.9 (Patient taking differently: Use as directed to check blood sugar 4 times daily.  DX E11.9) 200 each 6   hydrocortisone 2.5 % cream Apply on to the skin 2 (two) times daily. 30 g 1   hydrOXYzine (ATARAX) 10 MG tablet Take 1 tablet (10 mg total) by mouth 3 (three) times daily as needed for anxiety or itching. 30 tablet 1   insulin glargine, 2 Unit Dial, (TOUJEO MAX SOLOSTAR) 300 UNIT/ML Solostar Pen Inject 65 Units into the skin daily in the afternoon. 30 mL 2   insulin lispro (HUMALOG KWIKPEN) 100 UNIT/ML KwikPen Inject 10 Units into the skin 3 (three) times daily. 30 mL 3   Insulin Pen Needle 31G X 5 MM MISC Use as directed to inject insulin in the morning, at noon, in the evening, and at bedtime. 400 each 3   isosorbide mononitrate (IMDUR) 120 MG 24 hr tablet Take 1 tablet (120 mg total) by mouth daily. 90 tablet 3   ketorolac (ACULAR) 0.5 % ophthalmic solution Place 1 drop into the right eye 4 times a day as directed 5 mL 1   levocetirizine (XYZAL) 5 MG tablet Take 1 tablet (5 mg total) by mouth every evening. 30 tablet 2   levothyroxine (SYNTHROID) 100 MCG tablet TAKE 1 TABLET (100 MCG TOTAL) BY MOUTH DAILY. 90 tablet 1   meclizine (ANTIVERT) 25 MG tablet Take 1 tablet (25 mg total) by mouth 3 (three) times daily as needed for dizziness. 30 tablet 0   nitroGLYCERIN (NITROSTAT) 0.4 MG SL tablet PLACE 1 TABLET UNDER THE TONGUE EVERY 5 MINUTES AS NEEDED FOR CHEST PAIN. 25 tablet 1    pantoprazole (PROTONIX) 40 MG tablet TAKE 1 TABLET BY MOUTH EVERY DAY 90 tablet 3   permethrin (ELIMITE) 5 % cream Apply cream head to bottom soles of feet at bedtime and wash off in morning, can repeat once in 1-2 weeks 60 g 1   pramipexole (MIRAPEX) 1 MG tablet Take 1/2 to 1 tablet by mouth at bedtime 90 tablet 0   prednisoLONE acetate (PRED FORTE) 1 % ophthalmic suspension Place 1 drop into the right eye 4 times a day as directed. Please shake bottle well before each use. 5 mL 1   ranolazine (RANEXA) 500 MG 12 hr tablet TAKE 1 TABLET BY MOUTH TWICE DAILY 60 tablet 5   sertraline (ZOLOFT) 100 MG tablet TAKE 1 TABLET BY MOUTH TWICE DAILY 180 tablet 0   tiZANidine (ZANAFLEX) 4 MG tablet Take 1 tablet (4 mg total) by mouth at night for spasms and pain. 30 tablet 0   triamcinolone cream (KENALOG) 0.1 % Apply 1 application topically 2 (two) times daily. Use as needed for itchy rash 30 g 1   Vitamin D, Ergocalciferol, (DRISDOL) 1.25 MG (50000 UNIT) CAPS  capsule TAKE 1 CAPSULE BY MOUTH EVERY 7 DAYS 4 capsule 4   No facility-administered medications prior to visit.   Allergies  Allergen Reactions   Morphine Other (See Comments)    Hallucinations   Shellfish-Derived Products Itching   ROS     Objective:    Physical Exam Constitutional:      General: He is not in acute distress.    Appearance: Normal appearance. He is not ill-appearing.  HENT:     Head: Normocephalic and atraumatic.     Right Ear: External ear normal.     Left Ear: External ear normal.     Mouth/Throat:     Mouth: Mucous membranes are moist.     Pharynx: Oropharynx is clear.  Eyes:     Extraocular Movements: Extraocular movements intact.     Pupils: Pupils are equal, round, and reactive to light.  Neck:     Vascular: No carotid bruit.  Cardiovascular:     Rate and Rhythm: Normal rate and regular rhythm.     Pulses: Normal pulses.     Heart sounds: Normal heart sounds. No murmur heard.    No gallop.  Pulmonary:      Effort: Pulmonary effort is normal. No respiratory distress.     Breath sounds: Normal breath sounds. No wheezing or rales.  Abdominal:     General: Bowel sounds are normal.  Lymphadenopathy:     Cervical: No cervical adenopathy.  Skin:    General: Skin is warm and dry.  Neurological:     Mental Status: He is alert and oriented to person, place, and time.  Psychiatric:        Mood and Affect: Mood normal.        Behavior: Behavior normal.        Judgment: Judgment normal.    There were no vitals taken for this visit. Wt Readings from Last 3 Encounters:  08/02/22 180 lb 12.8 oz (82 kg)  07/28/22 183 lb 6 oz (83.2 kg)  06/16/22 184 lb 15.5 oz (83.9 kg)   Diabetic Foot Exam - Simple   No data filed    Lab Results  Component Value Date   WBC 7.8 06/16/2022   HGB 13.8 06/16/2022   HCT 39.9 06/16/2022   PLT 159 06/16/2022   GLUCOSE 269 (H) 06/16/2022   CHOL 101 06/29/2021   TRIG 118.0 06/29/2021   HDL 31.40 (L) 06/29/2021   LDLDIRECT 77.0 08/20/2019   LDLCALC 46 06/29/2021   ALT 17 04/27/2022   AST 20 04/27/2022   NA 139 06/16/2022   K 4.1 06/16/2022   CL 103 06/16/2022   CREATININE 1.08 06/16/2022   BUN 21 06/16/2022   CO2 24 06/16/2022   TSH 3.42 06/29/2021   PSA 3.14 08/20/2019   INR 1.2 06/16/2022   HGBA1C 9.4 (A) 06/01/2022   MICROALBUR <0.7 05/14/2019   Lab Results  Component Value Date   TSH 3.42 06/29/2021   Lab Results  Component Value Date   WBC 7.8 06/16/2022   HGB 13.8 06/16/2022   HCT 39.9 06/16/2022   MCV 85.4 06/16/2022   PLT 159 06/16/2022   Lab Results  Component Value Date   NA 139 06/16/2022   K 4.1 06/16/2022   CO2 24 06/16/2022   GLUCOSE 269 (H) 06/16/2022   BUN 21 06/16/2022   CREATININE 1.08 06/16/2022   BILITOT 0.6 04/27/2022   ALKPHOS 91 04/27/2022   AST 20 04/27/2022   ALT 17 04/27/2022   PROT 6.6  04/27/2022   ALBUMIN 4.3 04/27/2022   CALCIUM 9.7 06/16/2022   ANIONGAP 12 06/16/2022   GFR 56.00 (L) 04/27/2022   Lab  Results  Component Value Date   CHOL 101 06/29/2021   Lab Results  Component Value Date   HDL 31.40 (L) 06/29/2021   Lab Results  Component Value Date   LDLCALC 46 06/29/2021   Lab Results  Component Value Date   TRIG 118.0 06/29/2021   Lab Results  Component Value Date   CHOLHDL 3 06/29/2021   Lab Results  Component Value Date   HGBA1C 9.4 (A) 06/01/2022      Assessment & Plan:   Problem List Items Addressed This Visit   None  No orders of the defined types were placed in this encounter.  I, [name], personally preformed the services described in this documentation.  All medical record entries made by the scribe were at my direction and in my presence.  I have reviewed the chart and discharge instructions (if applicable) and agree that the record reflects my personal performance and is accurate and complete. 08/05/2022  I,Mohammed Iqbal,acting as a scribe for Penni Homans, MD.,have documented all relevant documentation on the behalf of Penni Homans, MD,as directed by  Penni Homans, MD while in the presence of Penni Homans, MD.  [name]

## 2022-08-06 ENCOUNTER — Encounter: Payer: Self-pay | Admitting: Internal Medicine

## 2022-08-06 ENCOUNTER — Ambulatory Visit (INDEPENDENT_AMBULATORY_CARE_PROVIDER_SITE_OTHER): Payer: Medicare Other | Admitting: Internal Medicine

## 2022-08-06 ENCOUNTER — Other Ambulatory Visit: Payer: Self-pay | Admitting: Family Medicine

## 2022-08-06 DIAGNOSIS — E1122 Type 2 diabetes mellitus with diabetic chronic kidney disease: Secondary | ICD-10-CM

## 2022-08-06 DIAGNOSIS — M79641 Pain in right hand: Secondary | ICD-10-CM | POA: Diagnosis not present

## 2022-08-06 DIAGNOSIS — I25111 Atherosclerotic heart disease of native coronary artery with angina pectoris with documented spasm: Secondary | ICD-10-CM

## 2022-08-06 DIAGNOSIS — I10 Essential (primary) hypertension: Secondary | ICD-10-CM

## 2022-08-06 DIAGNOSIS — I5032 Chronic diastolic (congestive) heart failure: Secondary | ICD-10-CM

## 2022-08-06 DIAGNOSIS — I480 Paroxysmal atrial fibrillation: Secondary | ICD-10-CM

## 2022-08-06 DIAGNOSIS — E785 Hyperlipidemia, unspecified: Secondary | ICD-10-CM

## 2022-08-06 NOTE — Progress Notes (Signed)
   Subjective:   Patient ID: [name] [name], male    DOB: February 11, 1946, 76 y.o.   MRN: [id number]  Hand Injury  Pertinent negatives include no chest pain.   The patient is a 76 YO man coming in for scratch to hand. Went to get cataract surgery today and they would not do until he was assessed.   Review of Systems  Constitutional: Negative.   HENT: Negative.    Eyes: Negative.   Respiratory:  Negative for cough, chest tightness and shortness of breath.   Cardiovascular:  Negative for chest pain, palpitations and leg swelling.  Gastrointestinal:  Negative for abdominal distention, abdominal pain, constipation, diarrhea, nausea and vomiting.  Musculoskeletal: Negative.   Skin:  Positive for wound.  Neurological: Negative.   Psychiatric/Behavioral: Negative.      Objective:  Physical Exam Constitutional:      Appearance: He is well-developed.  HENT:     Head: Normocephalic and atraumatic.  Cardiovascular:     Rate and Rhythm: Normal rate and regular rhythm.  Pulmonary:     Effort: Pulmonary effort is normal. No respiratory distress.     Breath sounds: Normal breath sounds. No wheezing or rales.  Abdominal:     General: Bowel sounds are normal. There is no distension.     Palpations: Abdomen is soft.     Tenderness: There is no abdominal tenderness. There is no rebound.  Musculoskeletal:        General: Tenderness present.     Cervical back: Normal range of motion.     Comments: Wound right hand without involvement of tendons, normal sensation and blood flow. No signs of infection.   Skin:    General: Skin is warm and dry.  Neurological:     Mental Status: He is alert and oriented to person, place, and time.     Coordination: Coordination normal.     Vitals:   08/06/22 1020  BP: 120/80  Pulse: (!) 55  Temp: 98.6 F (37 C)  TempSrc: Oral  SpO2: 99%  Weight: 185 lb (83.9 kg)  Height: '5\' 7"'$  (1.702 m)    Assessment & Plan:  Visit time 15 minutes in face to face  communication with patient and coordination of care, additional 5 minutes spent in record review, coordination or care, ordering tests, communicating/referring to other healthcare professionals, documenting in medical records all on the same day of the visit for total time 20 minutes spent on the visit.

## 2022-08-06 NOTE — Assessment & Plan Note (Signed)
Due to scratch. Last tetanus 2009 and advised to get a booster at pharmacy. We do not have Td in stock to give. No antibiotics needed. Letter done for eye doctor that he is safe to proceed with cataract surgery.

## 2022-08-06 NOTE — Patient Instructions (Signed)
We have given you the tetanus shot and the note for the eye doctor.

## 2022-08-10 ENCOUNTER — Telehealth: Payer: Self-pay | Admitting: Family Medicine

## 2022-08-10 NOTE — Telephone Encounter (Signed)
For our records:  We have received surgical clearance PW from [facility] eye care. The PW has been placed in both of Dr. Nathanial Millman boxes.

## 2022-08-12 ENCOUNTER — Telehealth: Payer: Self-pay | Admitting: *Deleted

## 2022-08-12 NOTE — Chronic Care Management (AMB) (Unsigned)
  Chronic Care Management   Outreach Note  08/12/2022 Name: [name] [name] MRN: [id number] DOB: Aug 24, 1946  [name] is a 76 y.o. year old male who is a primary care patient of [name], MD. I reached out to [name] by phone today in response to a referral sent by [name]'s primary care provider.  An unsuccessful telephone outreach was attempted today. The patient was referred to the case management team for assistance with care management and care coordination.   Follow Up Plan: A HIPAA compliant phone message was left for the patient providing contact information and requesting a return call.   Julian Hy, [place] Direct Dial: [phone]

## 2022-08-13 ENCOUNTER — Ambulatory Visit: Payer: Medicare Other | Admitting: Internal Medicine

## 2022-08-13 NOTE — Progress Notes (Deleted)
Name: [name] [name]  Age/ Sex: 76 y.o., male   MRN/ DOB: [id number], 02-Oct-1946     PCP: [name], MD   Reason for Endocrinology Evaluation: Type 2 Diabetes Mellitus  Initial Endocrine Consultative Visit: 11/21/2017    PATIENT IDENTIFIER: [name] [name] is a 76 y.o. male with a past medical history of T2DM, HTN, CAD,Hypothyroidism CHF and OSA. The patient has followed with Endocrinology clinic since 11/21/2017 for consultative assistance with management of his diabetes.   On his initia visit with me 04/2022 he was on NPH only with an A1c 8.7 % . He declined SGLT-2i and opted for prandial insulin . We restarted toujeo and stopped NPH  DIABETIC HISTORY:  [name] [name] was diagnosed with DM in 1989.He was on Metformin at some point.  His hemoglobin A1c has ranged from 6.4% in 2022, peaking at 8.7% in 2023.   SUBJECTIVE:   During the last visit (06/01/2022): A1c  9.4%, we adjusted MDI regimen     Today (08/13/2022): [name] [name] is here for follow-up on diabetes management. He is accompanied by his daughter today  He checks his blood sugars multiple  times daily through CGM. The patient has had hypoglycemic episodes since the last clinic visit, but he did not recall this .   He had an ED visit in July 2023 for chest pain, troponins were negative  HOME DIABETES REGIMEN:  Toujeo 65 units daily  Humalog 12 units TID QAC Correction Factor : HUmalog (BG-130/25)      Statin: yes ACE-I/ARB: no      CONTINUOUS GLUCOSE MONITORING RECORD INTERPRETATION   Unable to download  52- Hi       DIABETIC COMPLICATIONS: Microvascular complications:  Neuropathy, CKD III Denies:  Last Eye Exam: Completed   Macrovascular complications:  CAD, PVD Denies: CVA   HISTORY:  Past Medical History:  Past Medical History:  Diagnosis Date   Acquired atrophy of thyroid 06/05/2013   Overview:  July 2014: controlled on synthroid 73mg since 2012 May 2015: decreased to  Synthroid 50 mcg  Last Assessment & Plan:  Pt doing well with most recent TSH within normal range.  Will continue current dosage of Synthroid 515m. Pt reminded to take medication on empty stomach. Will continue to monitor with periodic laboratory assessment in 3 months.    Anemia    hemoglobin 7.4, iron deficiency, January, 2011, 2 unit transfusion, endoscopy normal, capsule endoscopy February, 2011 no small bowel abnormalities.   Most likely source gastric erosions, followed by GI   Anxiety    Bradycardia    CAD (coronary artery disease)    A. CABG in 2000,status post cardiac cath in 2006, 2009 ....continued chest pain and SOB despite oral medication adjestments including Ranexa. B. Cath November 2009/ mRCA - 2.75 x 23 Abbott Xience V drug-eluting stent ...11/26/2008 to distal  RCA leading to acute marginal.  C. Cath 07/2012 for CP - stable anatomy, med rx. d. cath 2015 and 05/30/2015 stable anatomy, consider Myoview if has CP again   Carotid artery disease ([place]/26/2016   Doppler, May 29, 2015, 1-39% bilateral ICA    Cerebral ischemia    MRI November, 2010, chronic microvascular ischemia   CKD (chronic kidney disease), stage III ([place]   Concussion    Depression    Bipolar   Diabetes mellitus ([place]   Edema    Essential hypertension 06/05/2013   Overview:  July 2014: Controlled with Bystolic Sep [id number] to atenolol.  Last  Assessment & Plan:  Will change to atenolol for cost and follow Improved.  Medication compliance strongly encouraged BP: 116/64 mmHg    Overview:  July 2014: Controlled with Bystolic Sep 7564: Changed to atenolol.  Last Assessment & Plan:  Will change to atenolol for cost and follow   Falling episodes    these have occurred in the past and again recurring 2011   Family history of adverse reaction to anesthesia    "mother died during bypass surgery but not sure if it has to do with anesthesia"   Gastric ulcer    GERD (gastroesophageal reflux disease)    H/O  medication noncompliance    Due to loss of insurance   H/O multiple concussions    Hard of hearing    Heart murmur    History of blood transfusion 12/20/2013   History of kidney stones    Hx of CABG    2000,  / one median sternotomy suture broken her chest x-ray November, 2010, no clinical significance   Hyperlipidemia    Hypertension    pt. denies   Iron deficiency anemia    Long term (current) use of anticoagulants [Z79.01] 07/20/2016   Low back pain 06/12/2009   Qualifier: Diagnosis of  By: [name]    Nephrolithiasis    Orthostasis    OSA (obstructive sleep apnea)    PAF (paroxysmal atrial fibrillation) ([place])    a. dx 2017, started on amiodarone/warfarin but patient intermittently noncompliant.   PSVT (paroxysmal supraventricular tachycardia) ([place])    RBBB 07/09/2009   Qualifier: Diagnosis of  By: [name], MD, [facility], [name]    RLS (restless legs syndrome) 09/19/2009   Qualifier: Diagnosis of  By: [name]   Overview:  July 2014: Controlled with Mirapex  Last Assessment & Plan:  Patient is doing well. Will continue current management and follow clinically.   Seizure disorder ([place]) 01/09/2017   Spondylosis    C5-6, C6-7 MRI 2010   Syncope 03/2016   TBI (traumatic brain injury) ([place]) 2019   Thyroid disease    Tubulovillous adenoma of colon 2007   Vitamin D deficiency 05/17/2017   Wears glasses    Past Surgical History:  Past Surgical History:  Procedure Laterality Date   ANTERIOR CERVICAL DECOMP/DISCECTOMY FUSION N/A 05/31/2016   Procedure: ANTERIOR CERVICAL DECOMPRESSION/DISCECTOMY FUSION CERVICAL FIVE-SIX,CERVICAL SIX-SEVEN;  Surgeon: [name], MD;  Location: [place] [facility] ORS;  Service: Neurosurgery;  Laterality: N/A;   CARDIAC CATHETERIZATION N/A 05/30/2015   Procedure: Left Heart Cath and Coronary Angiography;  Surgeon: [name], MD;  Location: [place] CV LAB;  Service: Cardiovascular;  Laterality: N/A;   COLONOSCOPY     CORONARY ARTERY  BYPASS GRAFT     2000   ESOPHAGOGASTRODUODENOSCOPY     LEFT HEART CATH AND CORS/GRAFTS ANGIOGRAPHY N/A 12/15/2017   Procedure: LEFT HEART CATH AND CORS/GRAFTS ANGIOGRAPHY;  Surgeon: [name], MD;  Location: [place] CV LAB;  Service: Cardiovascular;  Laterality: N/A;   LEFT HEART CATHETERIZATION WITH CORONARY/GRAFT ANGIOGRAM N/A 08/01/2012   Procedure: LEFT HEART CATHETERIZATION WITH Beatrix Fetters;  Surgeon: [name], MD;  Location[facility] LAB;  Service: Cardiovascular;  Laterality: N/A;   LEFT HEART CATHETERIZATION WITH CORONARY/GRAFT ANGIOGRAM N/A 01/03/2015   Procedure: LEFT HEART CATHETERIZATION WITH Beatrix Fetters;  Surgeon: [name], MD;  [facility] C[facility];  Service: Cardiovascular;  Laterality: N/A;   NASAL SEPTUM SURGERY     UP3   PERCUTANEOUS  CORONARY STENT INTERVENTION (PCI-S)  10/2008   mRCA PCI  2.75 x 23 Abbott Xience V drug-eluting stent    RIGHT/LEFT HEART CATH AND CORONARY/GRAFT ANGIOGRAPHY N/A 01/07/2020   Procedure: RIGHT/LEFT HEART CATH AND CORONARY/GRAFT ANGIOGRAPHY;  Surgeon: [name], MD;  Location: [place] CV LAB;  Service: Cardiovascular;  Laterality: N/A;   TOTAL KNEE ARTHROPLASTY Right 09/22/2021   Procedure: RIGHT TOTAL KNEE ARTHROPLASTY;  Surgeon: [name], MD;  Location: [facility];  Service: Orthopedics;  Laterality: Right;   ULTRASOUND GUIDANCE FOR VASCULAR ACCESS  12/15/2017   Procedure: Ultrasound Guidance For Vascular Access;  Surgeon: [name], MD;  Location: [place] CV LAB;  Service: Cardiovascular;;   Social History:  reports that he has never smoked. He has never been exposed to tobacco smoke. He has never used smokeless tobacco. He reports that he does not drink alcohol and does not use drugs. Family History:  Family History  Problem Relation Age of Onset   Pancreatic cancer Brother    Diabetes Brother    Coronary artery disease Brother    Stroke Brother    Diabetes  Brother    Diabetes Mother    Heart failure Mother    Heart failure Father    Hypothyroidism Brother    Coronary artery disease Brother    Other Brother        colon surgery   Heart attack Other        Nephew   Irregular heart beat Daughter    Cancer Maternal Grandmother        unknown    Colon cancer Neg Hx    Stomach cancer Neg Hx    Liver cancer Neg Hx    Rectal cancer Neg Hx    Esophageal cancer Neg Hx      HOME MEDICATIONS: Allergies as of 08/13/2022       Reactions   Morphine Other (See Comments)   Hallucinations   Shellfish-derived Products Itching        Medication List        Accurate as of August 13, 2022  7:13 AM. If you have any questions, ask your nurse or doctor.          amoxicillin 500 MG capsule Commonly known as: AMOXIL Take 4 capsules (2,000 mg total) by mouth 1 hour before dental procedure.   aspirin EC 81 MG tablet Take 81 mg by mouth daily. Swallow whole.   atorvastatin 80 MG tablet Commonly known as: LIPITOR Take 1 tablet (80 mg total) by mouth daily.   Besivance 0.6 % Susp Generic drug: Besifloxacin HCl Instill 1 drop into right eye three times a day as directed. Please store upside down!!   divalproex 500 MG DR tablet Commonly known as: Depakote Take 1 tablet (500 mg total) by mouth 2 (two) times daily.   donepezil 10 MG tablet Commonly known as: ARICEPT Take 1 tablet (10 mg total) by mouth at bedtime.   Eliquis 5 MG Tabs tablet Generic drug: apixaban Take 1 tablet (5 mg total) by mouth 2 (two) times daily.   fenofibrate 160 MG tablet Take 1 tablet (160 mg total) by mouth daily.   FreeStyle [place] 2 [employer] as directed   [employer] 2 [employer] Use as instructed to check blood sugar, change every 14 days   glucose blood test strip Commonly known as: Accu-Chek Aviva Use as directed to check blood sugar 4 times daily.  DX E11.9   HumaLOG KwikPen 100  UNIT/ML KwikPen Generic drug: insulin  lispro Inject 10 Units into the skin 3 (three) times daily.   hydrocortisone 2.5 % cream Apply on to the skin 2 (two) times daily.   hydrOXYzine 10 MG tablet Commonly known as: ATARAX Take 1 tablet (10 mg total) by mouth 3 (three) times daily as needed for anxiety or itching.   isosorbide mononitrate 120 MG 24 hr tablet Commonly known as: IMDUR Take 1 tablet (120 mg total) by mouth daily.   ketorolac 0.5 % ophthalmic solution Commonly known as: ACULAR Place 1 drop into the right eye 4 times a day as directed   levocetirizine 5 MG tablet Commonly known as: XYZAL Take 1 tablet (5 mg total) by mouth every evening.   levothyroxine 100 MCG tablet Commonly known as: SYNTHROID TAKE 1 TABLET (100 MCG TOTAL) BY MOUTH DAILY.   meclizine 25 MG tablet Commonly known as: ANTIVERT Take 1 tablet (25 mg total) by mouth 3 (three) times daily as needed for dizziness.   nitroGLYCERIN 0.4 MG SL tablet Commonly known as: NITROSTAT PLACE 1 TABLET UNDER THE TONGUE EVERY 5 MINUTES AS NEEDED FOR CHEST PAIN.   pantoprazole 40 MG tablet Commonly known as: PROTONIX TAKE 1 TABLET BY MOUTH EVERY DAY   Pennsaid 2 % Soln Generic drug: diclofenac Sodium Place 1 application onto the skin 2 (two) times daily. What changed:  when to take this reasons to take this   Pentips 31G X 5 MM Misc Generic drug: Insulin Pen Needle Use as directed to inject insulin in the morning, at noon, in the evening, and at bedtime.   permethrin 5 % cream Commonly known as: ELIMITE Apply cream head to bottom soles of feet at bedtime and wash off in morning, can repeat once in 1-2 weeks   pramipexole 1 MG tablet Commonly known as: Mirapex Take 1/2 to 1 tablet by mouth at bedtime   prednisoLONE acetate 1 % ophthalmic suspension Commonly known as: PRED FORTE Place 1 drop into the right eye 4 times a day as directed. Please shake bottle well before each use.   ranolazine 500 MG 12 hr tablet Commonly known as:  RANEXA TAKE 1 TABLET BY MOUTH TWICE DAILY   sertraline 100 MG tablet Commonly known as: ZOLOFT TAKE 1 TABLET BY MOUTH TWICE DAILY   tiZANidine 4 MG tablet Commonly known as: ZANAFLEX Take 1 tablet (4 mg total) by mouth at night for spasms and pain.   Toujeo Max SoloStar 300 UNIT/ML Solostar Pen Generic drug: insulin glargine (2 Unit Dial) Inject 65 Units into the skin daily in the afternoon.   triamcinolone cream 0.1 % Commonly known as: KENALOG Apply 1 application topically 2 (two) times daily. Use as needed for itchy rash   Vitamin D (Ergocalciferol) 1.25 MG (50000 UNIT) Caps capsule Commonly known as: DRISDOL TAKE 1 CAPSULE BY MOUTH EVERY 7 DAYS         OBJECTIVE:   Vital Signs: There were no vitals taken for this visit.  Wt Readings from Last 3 Encounters:  08/06/22 185 lb (83.9 kg)  08/02/22 180 lb 12.8 oz (82 kg)  07/28/22 183 lb 6 oz (83.2 kg)     Exam: General: Pt appears well and is in NAD  Lungs: Clear with good BS bilat with no rales, rhonchi, or wheezes  Heart: RRR with normal S1 and S2 and no gallops; no murmurs; no rub  Abdomen: Normoactive bowel sounds, soft, nontender, without masses or organomegaly palpable  Extremities: Trace  pretibial edema.  Neuro: MS is good with appropriate affect, pt is alert and Ox3       DATA REVIEWED:  Lab Results  Component Value Date   HGBA1C 9.4 (A) 06/01/2022   HGBA1C 8.7 (A) 03/22/2022   HGBA1C 7.6 (A) 01/19/2022    Latest Reference Range & Units 02/14/22 00:04  Sodium 135 - 145 mmol/L 140  Potassium 3.5 - 5.1 mmol/L 3.8  Chloride 98 - 111 mmol/L 109  CO2 22 - 32 mmol/L 25  Glucose 70 - 99 mg/dL 46 (L)  BUN 8 - 23 mg/dL 14  Creatinine 0.61 - 1.24 mg/dL 1.55 (H)  Calcium 8.9 - 10.3 mg/dL 9.5  Anion gap 5 - 15  6  GFR, Estimated >60 mL/min 47 (L)     ASSESSMENT / PLAN / RECOMMENDATIONS:   1) Type 2 Diabetes Mellitus, poorly controlled, With neuropathic, CKD III and macrovascular complications -  Most recent A1c of 9.4 %. Goal A1c <7.0%.      -Patient has been noted with hyperglycemia through the day. -Discussed pharmacokinetics of basal/bolus insulin and the importance of taking prandial insulin with meals.  We also discussed avoiding sugar-sweetened beverages and snacks, when possible.   -We had discussed SGLT-2 inhibitors , discussed cardiovascular and renal benefits as well as risk of genital infection. He opted for prandial insulin as he is not comfortable taking SGLT-2 I at the time of his visit in May 2023 -We also discussed the importance of separating Humalog dosing to prevent insulin stacking that would cause hypoglycemia -I will increase his prandial dose of insulin and encouraged him to continue to use the correction scale  MEDICATIONS:  Continue Toujeo 65 units ONCE daily  Increase Humalog 12 units TIDQAC Correction Factor : HUmalog (BG -130/25)   EDUCATION / INSTRUCTIONS: BG monitoring instructions: Patient is instructed to check his blood sugars 3 times a day, before meals . Call [place] [facility] clinic if: BG persistently < 70  I reviewed the Rule of 15 for the treatment of hypoglycemia in detail with the patient. Literature supplied.   2) Diabetic complications:  Eye: Does not have known diabetic retinopathy.  Neuro/ Feet: Does  have known diabetic peripheral neuropathy .  Renal: Patient does  have known baseline CKD. He   is not on an ACEI/ARB at present.    3) Peripheral Neuropathy:  -We discussed gabapentin as a treatment option, we also discussed side effects to include lower extremity edema, and drowsiness -We have opted to proceed with topical treatment such as capsaicin cream  F/U in 3 months     Signed electronically by: Mack Guise, MD  [facility] Endocrinology  [facility] [address] [place], [place] [address] Phone: [phone] FAX: [phone]   CC: [name], [address] [place] [address] Phone: [phone]  Fax: [phone]  Return to Endocrinology clinic as below: Future Appointments  Date Time Provider Adin  08/13/2022  8:50 AM Keidra Withers, Melanie Crazier, MD [facility] None  09/03/2022  9:20 AM [name], MD [facility]  03/02/2023  9:15 AM Alric Ran, MD [facility] None

## 2022-08-16 NOTE — Chronic Care Management (AMB) (Unsigned)
  Chronic Care Management   Outreach Note  08/16/2022 Name: [name] [name] MRN: [id number] DOB: September 14, 1946  [name] is a 76 y.o. year old male who is a primary care patient of [name], MD. I reached out to [name] by phone today in response to a referral sent by Mr. Slayde Brault Kats's primary care provider.  A second unsuccessful telephone outreach was attempted today. The patient was referred to the case management team for assistance with care management and care coordination.   Follow Up Plan: A HIPAA compliant phone message was left for the patient providing contact information and requesting a return call.   Julian Hy, [place] Direct Dial: [phone]

## 2022-08-17 NOTE — Chronic Care Management (AMB) (Signed)
  Chronic Care Management   Note  08/17/2022 Name: [name] [name] MRN: [id number] DOB: 02/12/1946  [name] is a 76 y.o. year old male who is a primary care patient of [name], MD. I reached out to [name] by phone today in response to a referral sent by [name]'s PCP.  [name] was given information about Chronic Care Management services today including:  CCM service includes personalized support from designated clinical staff supervised by his physician, including individualized plan of care and coordination with other care providers 24/7 contact phone numbers for assistance for urgent and routine care needs. Service will only be billed when office clinical staff spend 20 minutes or more in a month to coordinate care. Only one practitioner may furnish and bill the service in a calendar month. The patient may stop CCM services at any time (effective at the end of the month) by phone call to the office staff. The patient is responsible for co-pay (up to 20% after annual deductible is met) if co-pay is required by the individual health plan.   Patient agreed to services and verbal consent obtained.   Follow up plan: Telephone appointment with care management team member scheduled for: 08/23/2022  Julian Hy, [place] Direct Dial: [phone]

## 2022-08-20 ENCOUNTER — Other Ambulatory Visit (HOSPITAL_BASED_OUTPATIENT_CLINIC_OR_DEPARTMENT_OTHER): Payer: Self-pay

## 2022-08-23 ENCOUNTER — Telehealth: Payer: Medicare Other

## 2022-08-24 ENCOUNTER — Telehealth: Payer: Self-pay | Admitting: Interventional Cardiology

## 2022-08-24 NOTE — Telephone Encounter (Signed)
Spoke with pt's daughter, DPR and recommended pt contact other dental offices re: quotes for cost of procedure.  Pt's daughter verbalizes understanding and agrees with current plan.

## 2022-08-24 NOTE — Telephone Encounter (Signed)
See previous phone note for clearance (07/15/22). Pt wanting to know if we have any recommendations on other dental offices that can do his procedure at a cheaper cost

## 2022-08-27 ENCOUNTER — Telehealth: Payer: Self-pay | Admitting: *Deleted

## 2022-08-27 NOTE — Chronic Care Management (AMB) (Unsigned)
  Chronic Care Management Note  08/27/2022 Name: DAVARIUS RIDENER MRN: [id number] DOB: Mar 19, 1946  Cinda Quest Nedd is a 76 y.o. year old male who is a primary care patient of [name], MD and is actively engaged with the care management team. I reached out to [name] by phone today to assist with re-scheduling an initial visit with the Licensed Clinical Social Worker  Follow up plan: Unsuccessful telephone outreach attempt made. A HIPAA compliant phone message was left for the patient providing contact information and requesting a return call.   Julian Hy, [place] Direct Dial: [phone]

## 2022-08-30 ENCOUNTER — Other Ambulatory Visit (HOSPITAL_BASED_OUTPATIENT_CLINIC_OR_DEPARTMENT_OTHER): Payer: Self-pay

## 2022-08-31 NOTE — Chronic Care Management (AMB) (Signed)
  Chronic Care Management Note  08/31/2022 Name: [name] MRN: [id number] DOB: Feb 17, 1946  [name] [name] is a 76 y.o. year old male who is a primary care patient of [name], MD and is actively engaged with the care management team. I reached out to [name] by phone today to assist with re-scheduling an initial visit with the RN Case Manager  Follow up plan: We have been unable to make contact with the patient for follow up. The care management team is available to follow up with the patient after provider conversation with the patient regarding recommendation for care management engagement and subsequent re-referral to the care management team.   Julian Hy, [place] Direct Dial: [phone]
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
## 2022-09-03 ENCOUNTER — Other Ambulatory Visit (HOSPITAL_BASED_OUTPATIENT_CLINIC_OR_DEPARTMENT_OTHER): Payer: Self-pay

## 2022-09-03 ENCOUNTER — Ambulatory Visit: Payer: Medicare Other | Attending: Interventional Cardiology | Admitting: Interventional Cardiology

## 2022-09-03 ENCOUNTER — Encounter: Payer: Self-pay | Admitting: Interventional Cardiology

## 2022-09-03 VITALS — BP 92/62 | HR 107 | Ht 67.0 in | Wt 185.6 lb

## 2022-09-03 DIAGNOSIS — I25700 Atherosclerosis of coronary artery bypass graft(s), unspecified, with unstable angina pectoris: Secondary | ICD-10-CM

## 2022-09-03 DIAGNOSIS — I480 Paroxysmal atrial fibrillation: Secondary | ICD-10-CM

## 2022-09-03 DIAGNOSIS — N189 Chronic kidney disease, unspecified: Secondary | ICD-10-CM | POA: Diagnosis not present

## 2022-09-03 DIAGNOSIS — I5032 Chronic diastolic (congestive) heart failure: Secondary | ICD-10-CM

## 2022-09-03 DIAGNOSIS — I10 Essential (primary) hypertension: Secondary | ICD-10-CM

## 2022-09-03 DIAGNOSIS — E785 Hyperlipidemia, unspecified: Secondary | ICD-10-CM

## 2022-09-03 MED ORDER — AMIODARONE HCL 200 MG PO TABS
400.0000 mg | ORAL_TABLET | Freq: Every day | ORAL | 0 refills | Status: DC
Start: 2022-09-03 — End: 2022-09-14
  Filled 2022-09-03: qty 180, 90d supply, fill #0

## 2022-09-03 NOTE — Patient Instructions (Signed)
Medication Instructions:  Your physician has recommended you make the following change in your medication: Start Amiodarone 400 mg by mouth daily   *If you need a refill on your cardiac medications before your next appointment, please call your pharmacy*   Lab Work: none If you have labs (blood work) drawn today and your tests are completely normal, you will receive your results only by: [place] (if you have MyChart) OR A paper copy in the mail If you have any lab test that is abnormal or we need to change your treatment, we will call you to review the results.   Testing/Procedures: none   Follow-Up: At [facility], you and your health needs are our priority.  As part of our continuing mission to provide you with exceptional heart care, we have created designated Provider Care Teams.  These Care Teams include your primary Cardiologist (physician) and Advanced Practice Providers (APPs -  Physician Assistants and Nurse Practitioners) who all work together to provide you with the care you need, when you need it.  We recommend signing up for the patient portal called "MyChart".  Sign up information is provided on this After Visit Summary.  MyChart is used to connect with patients for Virtual Visits (Telemedicine).  Patients are able to view lab/test results, encounter notes, upcoming appointments, etc.  Non-urgent messages can be sent to your provider as well.   To learn more about what you can do with MyChart, go to [email or web address].    Your next appointment:   6 month(s)  The format for your next appointment:   In Person  Provider:   Larae Grooms, MD     Other Instructions  Please schedule nurse room visit for 3 weeks from now for EKG  Important Information About Sugar

## 2022-09-06 ENCOUNTER — Other Ambulatory Visit (HOSPITAL_BASED_OUTPATIENT_CLINIC_OR_DEPARTMENT_OTHER): Payer: Self-pay

## 2022-09-06 ENCOUNTER — Emergency Department (HOSPITAL_BASED_OUTPATIENT_CLINIC_OR_DEPARTMENT_OTHER)
Admission: EM | Admit: 2022-09-06 | Discharge: 2022-09-06 | Disposition: A | Discharge status: Home / Self Care | Payer: Medicare Other | Attending: Emergency Medicine | Admitting: Emergency Medicine

## 2022-09-06 ENCOUNTER — Encounter (HOSPITAL_BASED_OUTPATIENT_CLINIC_OR_DEPARTMENT_OTHER): Payer: Self-pay

## 2022-09-06 ENCOUNTER — Emergency Department (HOSPITAL_BASED_OUTPATIENT_CLINIC_OR_DEPARTMENT_OTHER): Payer: Medicare Other | Admitting: Radiology

## 2022-09-06 ENCOUNTER — Other Ambulatory Visit: Payer: Self-pay

## 2022-09-06 DIAGNOSIS — Z951 Presence of aortocoronary bypass graft: Secondary | ICD-10-CM | POA: Insufficient documentation

## 2022-09-06 DIAGNOSIS — Z794 Long term (current) use of insulin: Secondary | ICD-10-CM | POA: Diagnosis not present

## 2022-09-06 DIAGNOSIS — Z7901 Long term (current) use of anticoagulants: Secondary | ICD-10-CM | POA: Insufficient documentation

## 2022-09-06 DIAGNOSIS — E1140 Type 2 diabetes mellitus with diabetic neuropathy, unspecified: Secondary | ICD-10-CM | POA: Diagnosis not present

## 2022-09-06 DIAGNOSIS — R0602 Shortness of breath: Secondary | ICD-10-CM | POA: Diagnosis not present

## 2022-09-06 DIAGNOSIS — I209 Angina pectoris, unspecified: Secondary | ICD-10-CM

## 2022-09-06 DIAGNOSIS — E1122 Type 2 diabetes mellitus with diabetic chronic kidney disease: Secondary | ICD-10-CM | POA: Diagnosis not present

## 2022-09-06 DIAGNOSIS — I5032 Chronic diastolic (congestive) heart failure: Secondary | ICD-10-CM | POA: Diagnosis not present

## 2022-09-06 DIAGNOSIS — N184 Chronic kidney disease, stage 4 (severe): Secondary | ICD-10-CM | POA: Diagnosis not present

## 2022-09-06 DIAGNOSIS — Z96651 Presence of right artificial knee joint: Secondary | ICD-10-CM | POA: Diagnosis not present

## 2022-09-06 DIAGNOSIS — R079 Chest pain, unspecified: Secondary | ICD-10-CM | POA: Diagnosis not present

## 2022-09-06 DIAGNOSIS — I2510 Atherosclerotic heart disease of native coronary artery without angina pectoris: Secondary | ICD-10-CM | POA: Diagnosis not present

## 2022-09-06 DIAGNOSIS — I130 Hypertensive heart and chronic kidney disease with heart failure and stage 1 through stage 4 chronic kidney disease, or unspecified chronic kidney disease: Secondary | ICD-10-CM | POA: Diagnosis not present

## 2022-09-06 DIAGNOSIS — Z79899 Other long term (current) drug therapy: Secondary | ICD-10-CM | POA: Insufficient documentation

## 2022-09-06 DIAGNOSIS — Z7982 Long term (current) use of aspirin: Secondary | ICD-10-CM | POA: Diagnosis not present

## 2022-09-06 DIAGNOSIS — E039 Hypothyroidism, unspecified: Secondary | ICD-10-CM | POA: Diagnosis not present

## 2022-09-06 LAB — BASIC METABOLIC PANEL
Anion gap: 9 (ref 5–15)
BUN: 24 mg/dL — ABNORMAL HIGH (ref 8–23)
CO2: 24 mmol/L (ref 22–32)
Calcium: 9.6 mg/dL (ref 8.9–10.3)
Chloride: 104 mmol/L (ref 98–111)
Creatinine, Ser: 1.38 mg/dL — ABNORMAL HIGH (ref 0.61–1.24)
GFR, Estimated: 54 mL/min — ABNORMAL LOW (ref >60)
Glucose, Bld: 135 mg/dL — ABNORMAL HIGH (ref 70–99)
Potassium: 4.1 mmol/L (ref 3.5–5.1)
Sodium: 137 mmol/L (ref 135–145)

## 2022-09-06 LAB — CBC
HCT: 38.5 % — ABNORMAL LOW (ref 39.0–52.0)
Hemoglobin: 13 g/dL (ref 13.0–17.0)
MCH: 29.6 pg (ref 26.0–34.0)
MCHC: 33.8 g/dL (ref 30.0–36.0)
MCV: 87.7 fL (ref 80.0–100.0)
Platelets: 172 10*3/uL (ref 150–400)
RBC: 4.39 MIL/uL (ref 4.22–5.81)
RDW: 14.6 % (ref 11.5–15.5)
WBC: 7 10*3/uL (ref 4.0–10.5)
nRBC: 0 % (ref 0.0–0.2)

## 2022-09-06 LAB — TROPONIN I (HIGH SENSITIVITY)
Troponin I (High Sensitivity): 6 ng/L (ref <18)
Troponin I (High Sensitivity): 7 ng/L (ref <18)

## 2022-09-06 MED ORDER — AMLODIPINE BESYLATE 2.5 MG PO TABS
2.5000 mg | ORAL_TABLET | Freq: Every day | ORAL | 0 refills | Status: DC
  Filled 2022-09-06: qty 30, 30d supply, fill #0

## 2022-09-06 MED ORDER — NITROGLYCERIN 0.4 MG SL SUBL
SUBLINGUAL_TABLET | SUBLINGUAL | 1 refills | Status: DC
  Filled 2022-09-06: qty 25, 5d supply, fill #0

## 2022-09-06 NOTE — Discharge Instructions (Addendum)
We evaluated you for your chest pain.  Your cardiac enzymes are negative and your EKG was unchanged.  We talked to the cardiologist, who recommended starting you on a medicine called amlodipine which can help with this type of chest pain.  We have also refilled your nitroglycerin to take when you develop chest pain.  Please follow-up closely with your cardiologist.  Please return to the emergency department if your pain is severe, you develop persistent vomiting, trouble breathing, fainting, or the pain changes.

## 2022-09-06 NOTE — ED Triage Notes (Signed)
Pt states has been having chest on/off for weeks and pain has increased and is radiating down left arm.   +SHOB  Denies nausea Hx of atrial fib

## 2022-09-06 NOTE — ED Provider Notes (Signed)
[facility] EMERGENCY DEPT Provider Note  CSN: [id number] Arrival date & time: 09/06/22 1733  Chief Complaint(s) Chest Pain  HPI [name] [name] is a 76 y.o. male history of coronary artery disease, atrial fibrillation on Eliquis, CKD hypertension, diabetes, presenting for chest pain.  Patient reports around 3 weeks of chest pain, worse with exertion, associated with nausea, diaphoresis, shortness of breath.  He reports that it improves with rest but recently has been lasting longer.  He reports mild ongoing chest pain.  No vomiting.  No syncope.  No cough, runny nose.  No leg swelling, recent travel or surgeries.     Past Medical History Past Medical History:  Diagnosis Date   Acquired atrophy of thyroid 06/05/2013   Overview:  July 2014: controlled on synthroid 47mg since 2012 May 2015: decreased to Synthroid 50 mcg  Last Assessment & Plan:  Pt doing well with most recent TSH within normal range.  Will continue current dosage of Synthroid 538m. Pt reminded to take medication on empty stomach. Will continue to monitor with periodic laboratory assessment in 3 months.    Anemia    hemoglobin 7.4, iron deficiency, January, 2011, 2 unit transfusion, endoscopy normal, capsule endoscopy February, 2011 no small bowel abnormalities.   Most likely source gastric erosions, followed by GI   Anxiety    Bradycardia    CAD (coronary artery disease)    A. CABG in 2000,status post cardiac cath in 2006, 2009 ....continued chest pain and SOB despite oral medication adjestments including Ranexa. B. Cath November 2009/ mRCA - 2.75 x 23 Abbott Xience V drug-eluting stent ...11/26/2008 to distal  RCA leading to acute marginal.  C. Cath 07/2012 for CP - stable anatomy, med rx. d. cath 2015 and 05/30/2015 stable anatomy, consider Myoview if has CP again   Carotid artery disease (HCFajardo08/26/2016   Doppler, May 29, 2015, 1-39% bilateral ICA    Cerebral ischemia    MRI November, 2010, chronic  microvascular ischemia   CKD (chronic kidney disease), stage III (HCEagle   Concussion    Depression    Bipolar   Diabetes mellitus ([facility]   Edema    Essential hypertension 06/05/2013   Overview:  July 2014: Controlled with Bystolic Sep [id number] to atenolol.  Last Assessment & Plan:  Will change to atenolol for cost and follow Improved.  Medication compliance strongly encouraged BP: 116/64 mmHg    Overview:  July 2014: Controlled with Bystolic Sep [id number] to atenolol.  Last Assessment & Plan:  Will change to atenolol for cost and follow   Falling episodes    these have occurred in the past and again recurring 2011   Family history of adverse reaction to anesthesia    "mother died during bypass surgery but not sure if it has to do with anesthesia"   Gastric ulcer    GERD (gastroesophageal reflux disease)    H/O medication noncompliance    Due to loss of insurance   H/O multiple concussions    Hard of hearing    Heart murmur    History of blood transfusion 12/20/2013   History of kidney stones    Hx of CABG    2000,  / one median sternotomy suture broken her chest x-ray November, 2010, no clinical significance   Hyperlipidemia    Hypertension    pt. denies   Iron deficiency anemia    Long term (current) use of anticoagulants [Z79.01] 07/20/2016   Low back pain 06/12/2009  Qualifier: Diagnosis of  By: [name]    Nephrolithiasis    Orthostasis    OSA (obstructive sleep apnea)    PAF (paroxysmal atrial fibrillation) ([place])    a. dx 2017, started on amiodarone/warfarin but patient intermittently noncompliant.   PSVT (paroxysmal supraventricular tachycardia)    RBBB 07/09/2009   Qualifier: Diagnosis of  By: [name], MD, [facility], [name]    RLS (restless legs syndrome) 09/19/2009   Qualifier: Diagnosis of  By: [name]   Overview:  July 2014: Controlled with Mirapex  Last Assessment & Plan:  Patient is doing well. Will continue current management and follow  clinically.   Seizure disorder ([place]) 01/09/2017   Spondylosis    C5-6, C6-7 MRI 2010   Syncope 03/2016   TBI (traumatic brain injury) ([place]) 2019   Thyroid disease    Tubulovillous adenoma of colon 2007   Vitamin D deficiency 05/17/2017   Wears glasses    Patient Active Problem List   Diagnosis Date Noted   Right hand pain 08/06/2022   Polyneuropathy associated with underlying disease ([place]) 06/02/2022   Elevated sed rate 04/27/2022   Diabetes mellitus ([place]) 04/13/2022   Type 2 diabetes mellitus with stage 3a chronic kidney disease, with long-term current use of insulin ([place]) 04/13/2022   Type 2 diabetes mellitus with diabetic polyneuropathy, with long-term current use of insulin ([place]) 04/13/2022   Angina at rest 02/12/2022   Hypoglycemia 02/12/2022   Primary osteoarthritis of right knee 09/22/2021   Sprain of anterior talofibular ligament of left ankle 01/08/2021   Achilles tendon pain 06/06/2020   Thoracic back pain 05/11/2020   CKD (chronic kidney disease), stage IV (HCC)    Frequency of urination 05/13/2019   Obesity 02/05/2019   Neck pain 02/05/2019   MCI (mild cognitive impairment) 02/05/2019   Fall at home, initial encounter 11/06/2018   Localized swelling of both lower extremities 07/13/2018   Cervical radiculopathy at C8 02/08/2018   Hypersomnolence 10/18/2017   Vitamin D deficiency 05/17/2017   Seizure disorder ([place]) 01/09/2017   PAF (paroxysmal atrial fibrillation) ([place]) 07/20/2016   Long term (current) use of anticoagulants [Z79.01] 07/20/2016   Foraminal stenosis of cervical region 05/31/2016   DOE (dyspnea on exertion) 05/14/2016   Chronic diastolic CHF (congestive heart failure) ([place]) 03/13/2016   Headache 01/25/2016   Carotid artery disease ([place]) 08/01/2015   Spinal stenosis in cervical region 07/30/2015   Occipital neuralgia of right side 07/15/2015   Cervicogenic headache 07/15/2015   Dizziness    Chest pain 05/29/2015   Medicare annual wellness visit,  initial 05/11/2015   Sun-damaged skin 05/11/2015   Advance care planning 05/01/2015   Post-traumatic headache 02/18/2015   OA (osteoarthritis) of knee 01/07/2015   Recurrent pain of right knee 05/10/2014   Personal history of ongoing treatment with alendronate (Fosamax) 12/20/2013   History of blood transfusion 12/20/2013   Hyperlipidemia LDL goal <70 06/27/2013   Essential hypertension 06/05/2013   Arthritis of hand, degenerative 06/05/2013   Acquired atrophy of thyroid 06/05/2013   Hypothyroidism due to acquired atrophy of thyroid 06/05/2013   Osteoarthritis of hand 06/05/2013   Arteriosclerosis of coronary artery 05/01/2013   BPH with obstruction/lower urinary tract symptoms 05/01/2013   CAD (coronary artery disease) 05/01/2013   Abdominal pain, unspecified site 03/06/2013   Right shoulder pain 10/24/2012   Dermatitis 10/24/2012   Ejection fraction    Diabetes mellitus type II, uncontrolled 08/31/2011   Coronary artery disease involving native coronary artery  Anemia    OSA (obstructive sleep apnea)    Edema    Falling episodes    Hx of CABG    Palpitations    Shortness of breath    Insomnia 04/26/2011   RLS (restless legs syndrome) 09/19/2009   Depression with anxiety 07/09/2009   RBBB 07/09/2009   Low back pain potentially associated with radiculopathy 06/12/2009   SCIATICA 01/17/2009   GERD (gastroesophageal reflux disease) 01/09/2009   TUBULOVILLOUS ADENOMA, COLON, HX OF 01/08/2009   Presence of drug coated stent in right coronary artery 10/29/2008   NEPHROLITHIASIS, HX OF 12/20/2007   Home Medication(s) Prior to Admission medications   Medication Sig Start Date End Date Taking? Authorizing Provider  amLODipine (NORVASC) 2.5 MG tablet Take 1 tablet (2.5 mg total) by mouth daily. 09/06/22  Yes Cristie Hem, MD  amiodarone (PACERONE) 200 MG tablet Take 2 tablets (400 mg total) by mouth daily. 09/03/22   [name], MD  amoxicillin (AMOXIL) 500 MG  capsule Take 4 capsules (2,000 mg total) by mouth 1 hour before dental procedure. 07/12/22     apixaban (ELIQUIS) 5 MG TABS tablet Take 1 tablet (5 mg total) by mouth 2 (two) times daily. 02/19/22   [name], MD  aspirin EC 81 MG tablet Take 81 mg by mouth daily. Swallow whole.    [provider]  atorvastatin (LIPITOR) 80 MG tablet Take 1 tablet (80 mg total) by mouth daily. 07/22/22   [name], MD  BESIVANCE 0.6 % SUSP Instill 1 drop into right eye three times a day as directed. Please store upside down!! 05/25/22     Continuous Blood Gluc Sensor (FREESTYLE LIBRE 2 SENSOR) MISC Use as instructed to check blood sugar, change every 14 days 04/13/22   Shamleffer, Melanie Crazier, MD  Diclofenac Sodium (PENNSAID) 2 % SOLN Place 1 application onto the skin 2 (two) times daily. 10/17/20   Rosemarie Ax, MD  divalproex (DEPAKOTE) 500 MG DR tablet Take 1 tablet (500 mg total) by mouth 2 (two) times daily. 07/07/21   Kathrynn Ducking, MD  donepezil (ARICEPT) 10 MG tablet Take 1 tablet (10 mg total) by mouth at bedtime. 03/01/22 09/08/22  Alric Ran, MD  fenofibrate 160 MG tablet Take 1 tablet (160 mg total) by mouth daily. 02/15/22   Little Ishikawa, MD  glucose blood (ACCU-CHEK AVIVA) test strip Use as directed to check blood sugar 4 times daily.  DX E11.9 02/05/19   [name], MD  hydrocortisone 2.5 % cream Apply on to the skin 2 (two) times daily. 03/08/22 03/08/23  [name], MD  hydrOXYzine (ATARAX) 10 MG tablet Take 1 tablet (10 mg total) by mouth 3 (three) times daily as needed for anxiety or itching. 06/11/22   [name], MD  insulin glargine, 2 Unit Dial, (TOUJEO MAX SOLOSTAR) 300 UNIT/ML Solostar Pen Inject 65 Units into the skin daily in the afternoon. 04/13/22   Shamleffer, Melanie Crazier, MD  insulin lispro (HUMALOG KWIKPEN) 100 UNIT/ML KwikPen Inject 10 Units into the skin 3 (three) times daily. 04/13/22   Shamleffer, Melanie Crazier, MD  Insulin Pen Needle  31G X 5 MM MISC Use as directed to inject insulin in the morning, at noon, in the evening, and at bedtime. 04/13/22   Shamleffer, Melanie Crazier, MD  isosorbide mononitrate (IMDUR) 120 MG 24 hr tablet Take 1 tablet (120 mg total) by mouth daily. 02/26/22   [name], MD  ketorolac (ACULAR) 0.5 %  ophthalmic solution Place 1 drop into the right eye 4 times a day as directed 05/25/22   Darleen Crocker, MD  levocetirizine (XYZAL) 5 MG tablet Take 1 tablet (5 mg total) by mouth every evening. 07/28/22   Nani Ravens, Crosby Oyster, DO  levothyroxine (SYNTHROID) 100 MCG tablet TAKE 1 TABLET (100 MCG TOTAL) BY MOUTH DAILY. 04/13/22 04/13/23  [name], MD  meclizine (ANTIVERT) 25 MG tablet Take 1 tablet (25 mg total) by mouth 3 (three) times daily as needed for dizziness. 05/27/22   [name], MD  nitroGLYCERIN (NITROSTAT) 0.4 MG SL tablet PLACE 1 TABLET UNDER THE TONGUE EVERY 5 MINUTES AS NEEDED FOR CHEST PAIN. 09/06/22   Cristie Hem, MD  pantoprazole (PROTONIX) 40 MG tablet TAKE 1 TABLET BY MOUTH EVERY DAY 06/30/21   [name], MD  permethrin (ELIMITE) 5 % cream Apply cream head to bottom soles of feet at bedtime and wash off in morning, can repeat once in 1-2 weeks 04/27/22   [name], MD  pramipexole (MIRAPEX) 1 MG tablet Take 1/2 to 1 tablet by mouth at bedtime 07/22/22   [name], MD  prednisoLONE acetate (PRED FORTE) 1 % ophthalmic suspension Place 1 drop into the right eye 4 times a day as directed. Please shake bottle well before each use. 05/25/22   Darleen Crocker, MD  ranolazine (RANEXA) 500 MG 12 hr tablet TAKE 1 TABLET BY MOUTH TWICE DAILY 01/07/22 01/07/23  [name], MD  sertraline (ZOLOFT) 100 MG tablet TAKE 1 TABLET BY MOUTH TWICE DAILY 05/27/22   [name], MD  tiZANidine (ZANAFLEX) 4 MG tablet Take 1 tablet (4 mg total) by mouth at night for spasms and pain. 03/10/22     triamcinolone cream (KENALOG) 0.1 % Apply 1 application topically 2 (two) times  daily. Use as needed for itchy rash 05/22/21   [name], MD  Vitamin D, Ergocalciferol, (DRISDOL) 1.25 MG (50000 UNIT) CAPS capsule TAKE 1 CAPSULE BY MOUTH EVERY 7 DAYS 12/08/21 12/08/22  [name], MD                                                                                                                                    Past Surgical History Past Surgical History:  Procedure Laterality Date   ANTERIOR CERVICAL DECOMP/DISCECTOMY FUSION N/A 05/31/2016   Procedure: ANTERIOR CERVICAL DECOMPRESSION/DISCECTOMY FUSION CERVICAL FIVE-SIX,CERVICAL SIX-SEVEN;  Surgeon: [name], MD;  Location: [facility] NEURO ORS;  Service: Neurosurgery;  Laterality: N/A;   CARDIAC CATHETERIZATION N/A 05/30/2015   Procedure: Left Heart Cath and Coronary Angiography;  Surgeon: [name], MD;  Location: [facility] CV LAB;  Service: Cardiovascular;  Laterality: N/A;   COLONOSCOPY     CORONARY ARTERY BYPASS GRAFT     2000   ESOPHAGOGASTRODUODENOSCOPY     LEFT HEART CATH AND CORS/GRAFTS ANGIOGRAPHY N/A 12/15/2017   Procedure: LEFT HEART CATH AND  CORS/GRAFTS ANGIOGRAPHY;  Surgeon: [name], MD;  Location: [facility] CV LAB;  Service: Cardiovascular;  Laterality: N/A;   LEFT HEART CATHETERIZATION WITH CORONARY/GRAFT ANGIOGRAM N/A 08/01/2012   Procedure: LEFT HEART CATHETERIZATION WITH Beatrix Fetters;  Surgeon: [name], MD;  Location: [facility] CATH LAB;  Service: Cardiovascular;  Laterality: N/A;   LEFT HEART CATHETERIZATION WITH CORONARY/GRAFT ANGIOGRAM N/A 01/03/2015   Procedure: LEFT HEART CATHETERIZATION WITH Beatrix Fetters;  Surgeon: [name], MD;  Location: [facility] CATH LAB;  Service: Cardiovascular;  Laterality: N/A;   NASAL SEPTUM SURGERY     UP3   PERCUTANEOUS CORONARY STENT INTERVENTION (PCI-S)  10/2008   mRCA PCI  2.75 x 23 Abbott Xience V drug-eluting stent    RIGHT/LEFT HEART CATH AND CORONARY/GRAFT ANGIOGRAPHY N/A 01/07/2020   Procedure: RIGHT/LEFT HEART CATH  AND CORONARY/GRAFT ANGIOGRAPHY;  Surgeon: [name], MD;  Location: [facility] CV LAB;  Service: Cardiovascular;  Laterality: N/A;   TOTAL KNEE ARTHROPLASTY Right 09/22/2021   Procedure: RIGHT TOTAL KNEE ARTHROPLASTY;  Surgeon: [name], MD;  Location: [facility] ORS;  Service: Orthopedics;  Laterality: Right;   ULTRASOUND GUIDANCE FOR VASCULAR ACCESS  12/15/2017   Procedure: Ultrasound Guidance For Vascular Access;  Surgeon: [name], MD;  Location: [place] CV LAB;  Service: Cardiovascular;;   Family History Family History  Problem Relation Age of Onset   Pancreatic cancer Brother    Diabetes Brother    Coronary artery disease Brother    Stroke Brother    Diabetes Brother    Diabetes Mother    Heart failure Mother    Heart failure Father    Hypothyroidism Brother    Coronary artery disease Brother    Other Brother        colon surgery   Heart attack Other        Nephew   Irregular heart beat Daughter    Cancer Maternal Grandmother        unknown    Colon cancer Neg Hx    Stomach cancer Neg Hx    Liver cancer Neg Hx    Rectal cancer Neg Hx    Esophageal cancer Neg Hx     Social History Social History   Tobacco Use   Smoking status: Never    Passive exposure: Never   Smokeless tobacco: Never  Vaping Use   Vaping Use: Never used  Substance Use Topics   Alcohol use: No    Alcohol/week: 0.0 standard drinks of alcohol    Comment: stopped drinking in 1998   Drug use: No   Allergies Morphine and Shellfish-derived products  Review of Systems Review of Systems  All other systems reviewed and are negative.   Physical Exam Vital Signs  I have reviewed the triage vital signs BP 136/63   Pulse (!) 56   Temp 98.1 F (36.7 C)   Resp 16   Ht '5\' 8"'$  (1.727 m)   Wt 81.6 kg   SpO2 97%   BMI 27.37 kg/m  Physical Exam Vitals and nursing note reviewed.  Constitutional:      General: He is not in acute distress.    Appearance: Normal appearance.   HENT:     Mouth/Throat:     Mouth: Mucous membranes are moist.  Eyes:     Conjunctiva/sclera: Conjunctivae normal.  Cardiovascular:     Rate and Rhythm: Normal rate and regular rhythm.  Pulmonary:     Effort: Pulmonary effort is normal. No respiratory distress.  Breath sounds: Normal breath sounds.  Abdominal:     General: Abdomen is flat.     Palpations: Abdomen is soft.     Tenderness: There is no abdominal tenderness.  Musculoskeletal:     Right lower leg: No edema.     Left lower leg: No edema.  Skin:    General: Skin is warm and dry.     Capillary Refill: Capillary refill takes less than 2 seconds.  Neurological:     Mental Status: He is alert and oriented to person, place, and time. Mental status is at baseline.  Psychiatric:        Mood and Affect: Mood normal.        Behavior: Behavior normal.     ED Results and Treatments Labs (all labs ordered are listed, but only abnormal results are displayed) Labs Reviewed  BASIC METABOLIC PANEL - Abnormal; Notable for the following components:      Result Value   Glucose, Bld 135 (*)    BUN 24 (*)    Creatinine, Ser 1.38 (*)    GFR, Estimated 54 (*)    All other components within normal limits  CBC - Abnormal; Notable for the following components:   HCT 38.5 (*)    All other components within normal limits  TROPONIN I (HIGH SENSITIVITY)  TROPONIN I (HIGH SENSITIVITY)                                                                                                                          Radiology DG Chest 2 View  Result Date: 09/06/2022 CLINICAL DATA:  Chest pain and shortness of breath. EXAM: CHEST - 2 VIEW COMPARISON:  June 16, 2022 FINDINGS: Multiple sternal wires and vascular clips are noted. The heart size and mediastinal contours are within normal limits. Both lungs are clear. A radiopaque fusion plate and screws are seen within the lower cervical spine. The visualized skeletal structures are otherwise  unremarkable. IMPRESSION: 1. Evidence of prior median sternotomy/CABG. 2. No acute or active cardiopulmonary disease. Electronically Signed   By: Virgina Norfolk [name]   On: 09/06/2022 18:45    Pertinent labs & imaging results that were available during my care of the patient were reviewed by me and considered in my medical decision making (see MDM for details).  Medications Ordered in ED Medications - No data to display  Procedures Procedures  (including critical care time)  Medical Decision Making / ED Course   MDM:  76 year old male presenting to the emergency department chest pain.  Patient overall well-appearing, EKG appears similar to prior.  Chest x-ray clear.  Patient does have a history of chronic chest pain, unclear if current symptoms could be related to chronic angina or ACS but history is concerning.  Initial troponin negative.  Low concern for PE, no tachycardia, hypoxia.  Patient already on anticoagulation.  Doubt pneumonia with clear lungs, no pneumothorax on chest x-ray, doubt esophageal pathology.  Symptoms have been present for weeks, doubt aortic dissection.  Will obtain delta troponin.  Likely discussed with cardiology.  Clinical Course as of 09/06/22 2133  Mon Sep 06, 2022  2112 Discussed results with Dr. Humphrey Rolls.  Patient does have history of chronic chest pain.  He is currently chest pain-free.  His cardiac enzymes have been negative x2.  His EKG does not appear to have any acute changes.  He had a negative Lexiscan in March.  Dr. Yancey Flemings reviewed his prior work-up including prior catheterizations, thinks that symptoms are possibly due to microvascular angina, recommend starting amlodipine.  Does not think inpatient hospitalization would benefit the patient in terms of further work-up for intervenable ischemic heart disease especially with  negative troponins.  Will discharge patient and have him follow-up closely with his outpatient cardiologist, will start him on low-dose amlodipine.  Patient also reports he is out of his nitroglycerin so we will refill this. [WS]    Clinical Course User Index [WS] Cristie Hem, MD     Additional history obtained: -Additional history obtained from family -External records from outside source obtained and reviewed including: Chart review including previous notes, labs, imaging, consultation notes   Lab Tests: -I ordered, reviewed, and interpreted labs.   The pertinent results include:   Labs Reviewed  BASIC METABOLIC PANEL - Abnormal; Notable for the following components:      Result Value   Glucose, Bld 135 (*)    BUN 24 (*)    Creatinine, Ser 1.38 (*)    GFR, Estimated 54 (*)    All other components within normal limits  CBC - Abnormal; Notable for the following components:   HCT 38.5 (*)    All other components within normal limits  TROPONIN I (HIGH SENSITIVITY)  TROPONIN I (HIGH SENSITIVITY)      EKG   EKG Interpretation  Date/Time:  Monday September 06 2022 17:37:07 EDT Ventricular Rate:  75 PR Interval:  172 QRS Duration: 126 QT Interval:  420 QTC Calculation: 469 R Axis:   -23 Text Interpretation: Sinus rhythm with Premature atrial complexes Right bundle branch block Abnormal ECG When compared with ECG of 16-Jun-2022 11:43, Premature atrial complexes are now Present Confirmed by Garnette Gunner [phone]) on 09/06/2022 7:22:41 PM         Imaging Studies ordered: I ordered imaging studies including CXR On my interpretation imaging demonstrates clear lungs I independently visualized and interpreted imaging. I agree with the radiologist interpretation   Medicines ordered and prescription drug management: Meds ordered this encounter  Medications   nitroGLYCERIN (NITROSTAT) 0.4 MG SL tablet    Sig: PLACE 1 TABLET UNDER THE TONGUE EVERY 5 MINUTES AS NEEDED  FOR CHEST PAIN.    Dispense:  25 tablet    Refill:  1   amLODipine (NORVASC) 2.5 MG tablet    Sig: Take 1 tablet (2.5 mg total) by mouth daily.    Dispense:  30 tablet    Refill:  0    -I have reviewed the patients home medicines and have made adjustments as needed   Consultations Obtained: I requested consultation with the cardiologist,  and discussed lab and imaging findings as well as pertinent plan - they recommend: discharge with amlodipine, close outpatient follow up   Cardiac Monitoring: The patient was maintained on a cardiac monitor.  I personally viewed and interpreted the cardiac monitored which showed an underlying rhythm of: NSR   Reevaluation: After the interventions noted above, I reevaluated the patient and found that they have resolved  Co morbidities that complicate the patient evaluation  Past Medical History:  Diagnosis Date   Acquired atrophy of thyroid 06/05/2013   Overview:  July 2014: controlled on synthroid 58mg since 2012 May 2015: decreased to Synthroid 50 mcg  Last Assessment & Plan:  Pt doing well with most recent TSH within normal range.  Will continue current dosage of Synthroid 582m. Pt reminded to take medication on empty stomach. Will continue to monitor with periodic laboratory assessment in 3 months.    Anemia    hemoglobin 7.4, iron deficiency, January, 2011, 2 unit transfusion, endoscopy normal, capsule endoscopy February, 2011 no small bowel abnormalities.   Most likely source gastric erosions, followed by GI   Anxiety    Bradycardia    CAD (coronary artery disease)    A. CABG in 2000,status post cardiac cath in 2006, 2009 ....continued chest pain and SOB despite oral medication adjestments including Ranexa. B. Cath November 2009/ mRCA - 2.75 x 23 Abbott Xience V drug-eluting stent ...11/26/2008 to distal  RCA leading to acute marginal.  C. Cath 07/2012 for CP - stable anatomy, med rx. d. cath 2015 and 05/30/2015 stable anatomy, consider Myoview  if has CP again   Carotid artery disease ([place]08/26/2016   Doppler, May 29, 2015, 1-39% bilateral ICA    Cerebral ischemia    MRI November, 2010, chronic microvascular ischemia   CKD (chronic kidney disease), stage III ([place]   Concussion    Depression    Bipolar   Diabetes mellitus ([facility]   Edema    Essential hypertension 06/05/2013   Overview:  July 2014: Controlled with Bystolic Sep [id number] to atenolol.  Last Assessment & Plan:  Will change to atenolol for cost and follow Improved.  Medication compliance strongly encouraged BP: 116/64 mmHg    Overview:  July 2014: Controlled with Bystolic Sep [id number] to atenolol.  Last Assessment & Plan:  Will change to atenolol for cost and follow   Falling episodes    these have occurred in the past and again recurring 2011   Family history of adverse reaction to anesthesia    "mother died during bypass surgery but not sure if it has to do with anesthesia"   Gastric ulcer    GERD (gastroesophageal reflux disease)    H/O medication noncompliance    Due to loss of insurance   H/O multiple concussions    Hard of hearing    Heart murmur    History of blood transfusion 12/20/2013   History of kidney stones    Hx of CABG    2000,  / one median sternotomy suture broken her chest x-ray November, 2010, no clinical significance   Hyperlipidemia    Hypertension    pt. denies   Iron deficiency anemia    Long term (current) use of anticoagulants [Z79.01] 07/20/2016   Low back pain 06/12/2009  Qualifier: Diagnosis of  By: [name]    Nephrolithiasis    Orthostasis    OSA (obstructive sleep apnea)    PAF (paroxysmal atrial fibrillation) ([place])    a. dx 2017, started on amiodarone/warfarin but patient intermittently noncompliant.   PSVT (paroxysmal supraventricular tachycardia)    RBBB 07/09/2009   Qualifier: Diagnosis of  By: [name], MD, [facility], [name]    RLS (restless legs syndrome) 09/19/2009   Qualifier: Diagnosis of   By: [name]   Overview:  July 2014: Controlled with Mirapex  Last Assessment & Plan:  Patient is doing well. Will continue current management and follow clinically.   Seizure disorder ([place]) 01/09/2017   Spondylosis    C5-6, C6-7 MRI 2010   Syncope 03/2016   TBI (traumatic brain injury) ([place]) 2019   Thyroid disease    Tubulovillous adenoma of colon 2007   Vitamin D deficiency 05/17/2017   Wears glasses       Dispostion: Discharge    Final Clinical Impression(s) / ED Diagnoses Final diagnoses:  Angina pectoris ([place])     This chart was dictated using voice recognition software.  Despite best efforts to proofread,  errors can occur which can change the documentation meaning.    Cristie Hem, MD 09/06/22 2133

## 2022-09-07 ENCOUNTER — Other Ambulatory Visit (HOSPITAL_BASED_OUTPATIENT_CLINIC_OR_DEPARTMENT_OTHER): Payer: Self-pay

## 2022-09-08 ENCOUNTER — Telehealth: Payer: Self-pay | Admitting: Interventional Cardiology

## 2022-09-08 NOTE — Telephone Encounter (Signed)
Pt would like a callback regarding hospital f/u. Please advise

## 2022-09-08 NOTE — Telephone Encounter (Signed)
Left message to call office

## 2022-09-10 NOTE — Telephone Encounter (Signed)
Appointment has been scheduled.

## 2022-09-14 ENCOUNTER — Other Ambulatory Visit (HOSPITAL_BASED_OUTPATIENT_CLINIC_OR_DEPARTMENT_OTHER): Payer: Self-pay

## 2022-09-14 ENCOUNTER — Ambulatory Visit: Payer: Medicare Other | Attending: Interventional Cardiology | Admitting: Interventional Cardiology

## 2022-09-14 VITALS — BP 118/60 | HR 61 | Ht 71.0 in | Wt 178.0 lb

## 2022-09-14 DIAGNOSIS — E785 Hyperlipidemia, unspecified: Secondary | ICD-10-CM | POA: Diagnosis not present

## 2022-09-14 DIAGNOSIS — N189 Chronic kidney disease, unspecified: Secondary | ICD-10-CM

## 2022-09-14 DIAGNOSIS — Z7901 Long term (current) use of anticoagulants: Secondary | ICD-10-CM

## 2022-09-14 DIAGNOSIS — I25700 Atherosclerosis of coronary artery bypass graft(s), unspecified, with unstable angina pectoris: Secondary | ICD-10-CM | POA: Diagnosis not present

## 2022-09-14 DIAGNOSIS — I10 Essential (primary) hypertension: Secondary | ICD-10-CM | POA: Diagnosis not present

## 2022-09-14 DIAGNOSIS — I480 Paroxysmal atrial fibrillation: Secondary | ICD-10-CM | POA: Diagnosis not present

## 2022-09-14 MED ORDER — AMIODARONE HCL 200 MG PO TABS
200.0000 mg | ORAL_TABLET | Freq: Every day | ORAL | 3 refills | Status: DC
  Filled 2022-09-14 – 2023-03-08 (×2): qty 90, 90d supply, fill #0
  Filled 2023-05-31: qty 90, 90d supply, fill #1
  Filled 2023-08-26: qty 90, 90d supply, fill #2
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
## 2022-09-14 NOTE — Patient Instructions (Addendum)
Medication Instructions:  Your physician has recommended you make the following change in your medication: Decrease amiodarone to 200 mg by mouth daily  Stop amlodipine  *If you need a refill on your cardiac medications before your next appointment, please call your pharmacy*   Lab Work: none If you have labs (blood work) drawn today and your tests are completely normal, you will receive your results only by: [place] (if you have MyChart) OR A paper copy in the mail If you have any lab test that is abnormal or we need to change your treatment, we will call you to review the results.   Testing/Procedures: none   Follow-Up: At [facility], you and your health needs are our priority.  As part of our continuing mission to provide you with exceptional heart care, we have created designated Provider Care Teams.  These Care Teams include your primary Cardiologist (physician) and Advanced Practice Providers (APPs -  Physician Assistants and Nurse Practitioners) who all work together to provide you with the care you need, when you need it.  We recommend signing up for the patient portal called "MyChart".  Sign up information is provided on this After Visit Summary.  MyChart is used to connect with patients for Virtual Visits (Telemedicine).  Patients are able to view lab/test results, encounter notes, upcoming appointments, etc.  Non-urgent messages can be sent to your provider as well.   To learn more about what you can do with MyChart, go to [email or web address].    Your next appointment:   February 15, 2023  The format for your next appointment:   In Person  Provider:   Larae Grooms, MD     Other Instructions    Important Information About Sugar

## 2022-09-15 ENCOUNTER — Encounter: Payer: Self-pay | Admitting: Family Medicine

## 2022-09-21 ENCOUNTER — Other Ambulatory Visit (HOSPITAL_BASED_OUTPATIENT_CLINIC_OR_DEPARTMENT_OTHER): Payer: Self-pay

## 2022-09-21 ENCOUNTER — Telehealth (INDEPENDENT_AMBULATORY_CARE_PROVIDER_SITE_OTHER): Payer: Medicare Other | Admitting: Family Medicine

## 2022-09-21 ENCOUNTER — Encounter: Payer: Self-pay | Admitting: Family Medicine

## 2022-09-21 DIAGNOSIS — G4700 Insomnia, unspecified: Secondary | ICD-10-CM

## 2022-09-21 MED ORDER — AMITRIPTYLINE HCL 10 MG PO TABS
10.0000 mg | ORAL_TABLET | Freq: Every day | ORAL | 1 refills | Status: DC
  Filled 2022-09-21: qty 90, 30d supply, fill #0

## 2022-09-21 NOTE — Progress Notes (Signed)
Virtual telephone visit Virtual Visit via Telephone Note   This visit type was conducted due to national recommendations for restrictions regarding the COVID-19 Pandemic (e.g. social distancing) in an effort to limit this patient's exposure and mitigate transmission in our community. Due to his co-morbid illnesses, this patient is at least at moderate risk for complications without adequate follow up. This format is felt to be most appropriate for this patient at this time. The patient did not have access to video technology or had technical difficulties with video requiring transitioning to audio format only (telephone). Physical exam was limited to content and character of the telephone converstion. [place], CMA, was able to get the patient set up on a telephone visit.  Patient location: Patient at home and provider in visit Provider location: Office  I discussed the limitations of evaluation and management by telemedicine and the availability of in person appointments. The patient expressed understanding and agreed to proceed.   Visit Date: 09/21/2022  Today's healthcare provider: Penni Homans, MD     Subjective:    Patient ID: [name] [name], male    DOB: [birth year], 77 y.o.   MRN: [id number]  Chief Complaint  Patient presents with   Insomnia    Discuss sleep only sleeps about an hour at a time. Going on for 31m   HPI Patient is in today for a telephone visit. His daughter is speaking on his behalf today.   Sleep: His daughter reports that he has been having difficulty sleeping. She states that he sleeps for one hour before awakening. She further states that he has taken melatonin supplements, but this has been ineffective. He is interested in taking depression medications to help manage his sleep.   Past Medical History:  Diagnosis Date   Acquired atrophy of thyroid 06/05/2013   Overview:  July 2014: controlled on synthroid 770m since 2012 May 2015: decreased to Synthroid  50 mcg  Last Assessment & Plan:  Pt doing well with most recent TSH within normal range.  Will continue current dosage of Synthroid 5052m Pt reminded to take medication on empty stomach. Will continue to monitor with periodic laboratory assessment in 3 months.    Anemia    hemoglobin 7.4, iron deficiency, January, 2011, 2 unit transfusion, endoscopy normal, capsule endoscopy February, 2011 no small bowel abnormalities.   Most likely source gastric erosions, followed by GI   Anxiety    Bradycardia    CAD (coronary artery disease)    A. CABG in 2000,status post cardiac cath in 2006, 2009 ....continued chest pain and SOB despite oral medication adjestments including Ranexa. B. Cath November 2009/ mRCA - 2.75 x 23 Abbott Xience V drug-eluting stent ...11/26/2008 to distal  RCA leading to acute marginal.  C. Cath 07/2012 for CP - stable anatomy, med rx. d. cath 2015 and 05/30/2015 stable anatomy, consider Myoview if has CP again   Carotid artery disease ([place]CC[place]8/26/2016   Doppler, May 29, 2015, 1-39% bilateral ICA    Cerebral ischemia    MRI November, 2010, chronic microvascular ischemia   CKD (chronic kidney disease), stage III ([facility]  Concussion    Depression    Bipolar   Diabetes mellitus ([place]  Edema    Essential hypertension 06/05/2013   Overview:  July 2014: Controlled with Bystolic Sep [id number] to atenolol.  Last Assessment & Plan:  Will change to atenolol for cost and follow Improved.  Medication compliance strongly encouraged BP: 116/64 mmHg  Overview:  July 2014: Controlled with Bystolic Sep 4097: Changed to atenolol.  Last Assessment & Plan:  Will change to atenolol for cost and follow   Falling episodes    these have occurred in the past and again recurring 2011   Family history of adverse reaction to anesthesia    "mother died during bypass surgery but not sure if it has to do with anesthesia"   Gastric ulcer    GERD (gastroesophageal reflux disease)    H/O medication  noncompliance    Due to loss of insurance   H/O multiple concussions    Hard of hearing    Heart murmur    History of blood transfusion 12/20/2013   History of kidney stones    Hx of CABG    2000,  / one median sternotomy suture broken her chest x-ray November, 2010, no clinical significance   Hyperlipidemia    Hypertension    pt. denies   Iron deficiency anemia    Long term (current) use of anticoagulants [Z79.01] 07/20/2016   Low back pain 06/12/2009   Qualifier: Diagnosis of  By: [name]    Nephrolithiasis    Orthostasis    OSA (obstructive sleep apnea)    PAF (paroxysmal atrial fibrillation) ([place])    a. dx 2017, started on amiodarone/warfarin but patient intermittently noncompliant.   PSVT (paroxysmal supraventricular tachycardia)    RBBB 07/09/2009   Qualifier: Diagnosis of  By: [name], MD, [facility], [name]    RLS (restless legs syndrome) 09/19/2009   Qualifier: Diagnosis of  By: [name]   Overview:  July 2014: Controlled with Mirapex  Last Assessment & Plan:  Patient is doing well. Will continue current management and follow clinically.   Seizure disorder ([place]) 01/09/2017   Spondylosis    C5-6, C6-7 MRI 2010   Syncope 03/2016   TBI (traumatic brain injury) ([place]) 2019   Thyroid disease    Tubulovillous adenoma of colon 2007   Vitamin D deficiency 05/17/2017   Wears glasses    Past Surgical History:  Procedure Laterality Date   ANTERIOR CERVICAL DECOMP/DISCECTOMY FUSION N/A 05/31/2016   Procedure: ANTERIOR CERVICAL DECOMPRESSION/DISCECTOMY FUSION CERVICAL FIVE-SIX,CERVICAL SIX-SEVEN;  Surgeon: [name], MD;  Location: [place] [facility]URO ORS;  Service: Neurosurgery;  Laterality: N/A;   CARDIAC CATHETERIZATION N/A 05/30/2015   Procedure: Left Heart Cath and Coronary Angiography;  Surgeon: [name], MD;  Location: [place] CV LAB;  Service: Cardiovascular;  Laterality: N/A;   COLONOSCOPY     CORONARY ARTERY BYPASS GRAFT     2000    ESOPHAGOGASTRODUODENOSCOPY     LEFT HEART CATH AND CORS/GRAFTS ANGIOGRAPHY N/A 12/15/2017   Procedure: LEFT HEART CATH AND CORS/GRAFTS ANGIOGRAPHY;  Surgeon: [name], MD;  Location: [place] CV LAB;  Service: Cardiovascular;  Laterality: N/A;   LEFT HEART CATHETERIZATION WITH CORONARY/GRAFT ANGIOGRAM N/A 08/01/2012   Procedure: LEFT HEART CATHETERIZATION WITH Beatrix Fetters;  Surgeon: [name], MD;  Location: [facility] CATH LAB;  Service: Cardiovascular;  Laterality: N/A;   LEFT HEART CATHETERIZATION WITH CORONARY/GRAFT ANGIOGRAM N/A 01/03/2015   Procedure: LEFT HEART CATHETERIZATION WITH Beatrix Fetters;  Surgeon: [name], MD;  Location: [facility] CATH LAB;  Service: Cardiovascular;  Laterality: N/A;   NASAL SEPTUM SURGERY     UP3   PERCUTANEOUS CORONARY STENT INTERVENTION (PCI-S)  10/2008   mRCA PCI  2.75 x 23 Abbott Xience V drug-eluting stent    RIGHT/LEFT HEART CATH AND CORONARY/GRAFT ANGIOGRAPHY N/A  01/07/2020   Procedure: RIGHT/LEFT HEART CATH AND CORONARY/GRAFT ANGIOGRAPHY;  Surgeon: [name], MD;  Location: [place] CV LAB;  Service: Cardiovascular;  Laterality: N/A;   TOTAL KNEE ARTHROPLASTY Right 09/22/2021   Procedure: RIGHT TOTAL KNEE ARTHROPLASTY;  Surgeon: [name], MD;  Location: [facility];  Service: Orthopedics;  Laterality: Right;   ULTRASOUND GUIDANCE FOR VASCULAR ACCESS  12/15/2017   Procedure: Ultrasound Guidance For Vascular Access;  Surgeon: [name], MD;  Location: [place] CV LAB;  Service: Cardiovascular;;   Family History  Problem Relation Age of Onset   Pancreatic cancer Brother    Diabetes Brother    Coronary artery disease Brother    Stroke Brother    Diabetes Brother    Diabetes Mother    Heart failure Mother    Heart failure Father    Hypothyroidism Brother    Coronary artery disease Brother    Other Brother        colon surgery   Heart attack Other        Nephew   Irregular heart beat  Daughter    Cancer Maternal Grandmother        unknown    Colon cancer Neg Hx    Stomach cancer Neg Hx    Liver cancer Neg Hx    Rectal cancer Neg Hx    Esophageal cancer Neg Hx    Social History   Socioeconomic History   Marital status: Married    Spouse name: Not on file   Number of children: 4   Years of education: 51   Highest education level: Not on file  Occupational History   Occupation: retired  Tobacco Use   Smoking status: Never    Passive exposure: Never   Smokeless tobacco: Never  Vaping Use   Vaping Use: Never used  Substance and Sexual Activity   Alcohol use: No    Alcohol/week: 0.0 standard drinks of alcohol    Comment: stopped drinking in 1998   Drug use: No   Sexual activity: Not Currently    Partners: Female  Other Topics Concern   Not on file  Social History Narrative   Patient is right handed.   Patient does not drink caffeine.   Social Determinants of Health   Financial Resource Strain: Medium Risk (08/02/2022)   Overall Financial Resource Strain (CARDIA)    Difficulty of Paying Living Expenses: Somewhat hard  Food Insecurity: No Food Insecurity (08/02/2022)   Hunger Vital Sign    Worried About Running Out of Food in the Last Year: Never true    Ran Out of Food in the Last Year: Never true  Transportation Needs: No Transportation Needs (08/02/2022)   PRAPARE - Hydrologist (Medical): No    Lack of Transportation (Non-Medical): No  Physical Activity: Insufficiently Active (08/02/2022)   Exercise Vital Sign    Days of Exercise per Week: 7 days    Minutes of Exercise per Session: 20 min  Stress: Stress Concern Present (08/02/2022)   [place]    Feeling of Stress : To some extent  Social Connections: Moderately Isolated (08/02/2022)   Social Connection and Isolation Panel [NHANES]    Frequency of Communication with Friends and Family: More than three  times a week    Frequency of Social Gatherings with Friends and Family: More than three times a week    Attends Religious Services: More than 4 times per  year    Active Member of Clubs or Organizations: No    Attends Archivist Meetings: Never    Marital Status: Widowed  Intimate Partner Violence: Not At Risk (08/02/2022)   Humiliation, Afraid, Rape, and Kick questionnaire    Fear of Current or Ex-Partner: No    Emotionally Abused: No    Physically Abused: No    Sexually Abused: No   Outpatient Medications Prior to Visit  Medication Sig Dispense Refill   amiodarone (PACERONE) 200 MG tablet Take 1 tablet (200 mg total) by mouth daily. 90 tablet 3   amoxicillin (AMOXIL) 500 MG capsule Take 4 capsules (2,000 mg total) by mouth 1 hour before dental procedure. 4 capsule 0   apixaban (ELIQUIS) 5 MG TABS tablet Take 1 tablet (5 mg total) by mouth 2 (two) times daily. 60 tablet 5   aspirin EC 81 MG tablet Take 81 mg by mouth daily. Swallow whole.     atorvastatin (LIPITOR) 80 MG tablet Take 1 tablet (80 mg total) by mouth daily. 90 tablet 0   BESIVANCE 0.6 % SUSP Instill 1 drop into right eye three times a day as directed. Please store upside down!! 5 mL 1   Continuous Blood Gluc Sensor (FREESTYLE LIBRE 2 SENSOR) MISC Use as instructed to check blood sugar, change every 14 days 6 each 3   Diclofenac Sodium (PENNSAID) 2 % SOLN Place 1 application onto the skin 2 (two) times daily. 112 g 2   divalproex (DEPAKOTE) 500 MG DR tablet Take 1 tablet (500 mg total) by mouth 2 (two) times daily. 60 tablet 5   fenofibrate 160 MG tablet Take 1 tablet (160 mg total) by mouth daily. 30 tablet 0   glucose blood (ACCU-CHEK AVIVA) test strip Use as directed to check blood sugar 4 times daily.  DX E11.9 200 each 6   hydrocortisone 2.5 % cream Apply on to the skin 2 (two) times daily. 30 g 1   hydrOXYzine (ATARAX) 10 MG tablet Take 1 tablet (10 mg total) by mouth 3 (three) times daily as needed for anxiety  or itching. 30 tablet 1   insulin glargine, 2 Unit Dial, (TOUJEO MAX SOLOSTAR) 300 UNIT/ML Solostar Pen Inject 65 Units into the skin daily in the afternoon. 30 mL 2   insulin lispro (HUMALOG KWIKPEN) 100 UNIT/ML KwikPen Inject 10 Units into the skin 3 (three) times daily. 30 mL 3   Insulin Pen Needle 31G X 5 MM MISC Use as directed to inject insulin in the morning, at noon, in the evening, and at bedtime. 400 each 3   isosorbide mononitrate (IMDUR) 120 MG 24 hr tablet Take 1 tablet (120 mg total) by mouth daily. 90 tablet 3   ketorolac (ACULAR) 0.5 % ophthalmic solution Place 1 drop into the right eye 4 times a day as directed 5 mL 1   levocetirizine (XYZAL) 5 MG tablet Take 1 tablet (5 mg total) by mouth every evening. 30 tablet 2   levothyroxine (SYNTHROID) 100 MCG tablet TAKE 1 TABLET (100 MCG TOTAL) BY MOUTH DAILY. 90 tablet 1   meclizine (ANTIVERT) 25 MG tablet Take 1 tablet (25 mg total) by mouth 3 (three) times daily as needed for dizziness. 30 tablet 0   nitroGLYCERIN (NITROSTAT) 0.4 MG SL tablet PLACE 1 TABLET UNDER THE TONGUE EVERY 5 MINUTES AS NEEDED FOR CHEST PAIN. 25 tablet 1   pantoprazole (PROTONIX) 40 MG tablet TAKE 1 TABLET BY MOUTH EVERY DAY 90 tablet 3   pramipexole (  MIRAPEX) 1 MG tablet Take 1/2 to 1 tablet by mouth at bedtime 90 tablet 0   prednisoLONE acetate (PRED FORTE) 1 % ophthalmic suspension Place 1 drop into the right eye 4 times a day as directed. Please shake bottle well before each use. 5 mL 1   ranolazine (RANEXA) 500 MG 12 hr tablet TAKE 1 TABLET BY MOUTH TWICE DAILY 60 tablet 5   sertraline (ZOLOFT) 100 MG tablet TAKE 1 TABLET BY MOUTH TWICE DAILY 180 tablet 0   triamcinolone cream (KENALOG) 0.1 % Apply 1 application topically 2 (two) times daily. Use as needed for itchy rash 30 g 1   Vitamin D, Ergocalciferol, (DRISDOL) 1.25 MG (50000 UNIT) CAPS capsule TAKE 1 CAPSULE BY MOUTH EVERY 7 DAYS 4 capsule 4   donepezil (ARICEPT) 10 MG tablet Take 1 tablet (10 mg total)  by mouth at bedtime. 90 tablet 4   permethrin (ELIMITE) 5 % cream Apply cream head to bottom soles of feet at bedtime and wash off in morning, can repeat once in 1-2 weeks (Patient not taking: Reported on 09/14/2022) 60 g 1   tiZANidine (ZANAFLEX) 4 MG tablet Take 1 tablet (4 mg total) by mouth at night for spasms and pain. (Patient not taking: Reported on 09/14/2022) 30 tablet 0   No facility-administered medications prior to visit.   Allergies  Allergen Reactions   Morphine Other (See Comments)    Hallucinations   Shellfish-Derived Products Itching   ROS    Objective:    Physical Exam  Ht '5\' 9"'$  (1.753 m)   Wt 179 lb (81.2 kg)   BMI 26.43 kg/m  Wt Readings from Last 3 Encounters:  09/21/22 179 lb (81.2 kg)  09/14/22 178 lb (80.7 kg)  09/06/22 180 lb (81.6 kg)   Diabetic Foot Exam - Simple   No data filed    Lab Results  Component Value Date   WBC 7.0 09/06/2022   HGB 13.0 09/06/2022   HCT 38.5 (L) 09/06/2022   PLT 172 09/06/2022   GLUCOSE 135 (H) 09/06/2022   CHOL 101 06/29/2021   TRIG 118.0 06/29/2021   HDL 31.40 (L) 06/29/2021   LDLDIRECT 77.0 08/20/2019   LDLCALC 46 06/29/2021   ALT 17 04/27/2022   AST 20 04/27/2022   NA 137 09/06/2022   K 4.1 09/06/2022   CL 104 09/06/2022   CREATININE 1.38 (H) 09/06/2022   BUN 24 (H) 09/06/2022   CO2 24 09/06/2022   TSH 3.42 06/29/2021   PSA 3.14 08/20/2019   INR 1.2 06/16/2022   HGBA1C 9.4 (A) 06/01/2022   MICROALBUR <0.7 05/14/2019   Lab Results  Component Value Date   TSH 3.42 06/29/2021   Lab Results  Component Value Date   WBC 7.0 09/06/2022   HGB 13.0 09/06/2022   HCT 38.5 (L) 09/06/2022   MCV 87.7 09/06/2022   PLT 172 09/06/2022   Lab Results  Component Value Date   NA 137 09/06/2022   K 4.1 09/06/2022   CO2 24 09/06/2022   GLUCOSE 135 (H) 09/06/2022   BUN 24 (H) 09/06/2022   CREATININE 1.38 (H) 09/06/2022   BILITOT 0.6 04/27/2022   ALKPHOS 91 04/27/2022   AST 20 04/27/2022   ALT 17  04/27/2022   PROT 6.6 04/27/2022   ALBUMIN 4.3 04/27/2022   CALCIUM 9.6 09/06/2022   ANIONGAP 9 09/06/2022   GFR 56.00 (L) 04/27/2022   Lab Results  Component Value Date   CHOL 101 06/29/2021   Lab Results  Component  Value Date   HDL 31.40 (L) 06/29/2021   Lab Results  Component Value Date   LDLCALC 46 06/29/2021   Lab Results  Component Value Date   TRIG 118.0 06/29/2021   Lab Results  Component Value Date   CHOLHDL 3 06/29/2021   Lab Results  Component Value Date   HGBA1C 9.4 (A) 06/01/2022      Assessment & Plan:   Problem List Items Addressed This Visit   None  No orders of the defined types were placed in this encounter.  I discussed the assessment and treatment plan with the patient. The patient was provided an opportunity to ask questions and all were answered. The patient agreed with the plan and demonstrated an understanding of the instructions.   The patient was advised to call back or seek an in-person evaluation if the symptoms worsen or if the condition fails to improve as anticipated.  I provided 20 minutes of non-face-to-face time during this encounter.  I,Mohammed Iqbal,acting as a scribe for Penni Homans, MD.,have documented all relevant documentation on the behalf of Penni Homans, MD,as directed by  Penni Homans, MD while in the presence of Penni Homans, MD.  Penni Homans, MD [facility] [phone] (phone) [phone] (fax)  [place]

## 2022-09-22 ENCOUNTER — Other Ambulatory Visit (HOSPITAL_BASED_OUTPATIENT_CLINIC_OR_DEPARTMENT_OTHER): Payer: Self-pay

## 2022-09-22 NOTE — Assessment & Plan Note (Signed)
Patient is struggling with trouble falling and staying asleep. Encouraged good sleep hygiene such as dark, quiet room. No blue/green glowing lights such as computer screens in bedroom. No alcohol or stimulants in evening. Cut down on caffeine as able. Regular exercise is helpful but not just prior to bed time. Will try low dose Elavil 10-30 mg qhs and reevaluate. Warned to watch for side effects and stop if any concerns and report back to [place].

## 2022-09-23 ENCOUNTER — Other Ambulatory Visit (HOSPITAL_BASED_OUTPATIENT_CLINIC_OR_DEPARTMENT_OTHER): Payer: Self-pay

## 2022-09-23 ENCOUNTER — Other Ambulatory Visit: Payer: Self-pay | Admitting: Family Medicine

## 2022-09-23 MED ORDER — PANTOPRAZOLE SODIUM 40 MG PO TBEC
40.0000 mg | DELAYED_RELEASE_TABLET | Freq: Every day | ORAL | 1 refills | Status: DC
  Filled 2022-09-23: qty 90, 90d supply, fill #0
  Filled 2023-03-08: qty 90, 90d supply, fill #1

## 2022-09-24 ENCOUNTER — Ambulatory Visit: Payer: Medicare Other

## 2022-09-27 ENCOUNTER — Other Ambulatory Visit (HOSPITAL_BASED_OUTPATIENT_CLINIC_OR_DEPARTMENT_OTHER): Payer: Self-pay

## 2022-09-27 MED ORDER — FLUAD QUADRIVALENT 0.5 ML IM PRSY
PREFILLED_SYRINGE | INTRAMUSCULAR | 0 refills | Status: DC
  Filled 2022-09-27: qty 0.5, 1d supply, fill #0

## 2022-09-27 MED ORDER — COMIRNATY 30 MCG/0.3ML IM SUSY
PREFILLED_SYRINGE | INTRAMUSCULAR | 0 refills | Status: DC
  Filled 2022-09-27: qty 0.3, 1d supply, fill #0

## 2022-10-07 ENCOUNTER — Other Ambulatory Visit (HOSPITAL_BASED_OUTPATIENT_CLINIC_OR_DEPARTMENT_OTHER): Payer: Self-pay

## 2022-10-07 ENCOUNTER — Other Ambulatory Visit: Payer: Self-pay | Admitting: Family Medicine

## 2022-10-07 MED ORDER — SERTRALINE HCL 100 MG PO TABS
100.0000 mg | ORAL_TABLET | Freq: Two times a day (BID) | ORAL | 0 refills | Status: DC
  Filled 2022-10-07: qty 180, 90d supply, fill #0

## 2022-10-08 ENCOUNTER — Other Ambulatory Visit (HOSPITAL_BASED_OUTPATIENT_CLINIC_OR_DEPARTMENT_OTHER): Payer: Self-pay

## 2022-10-08 DIAGNOSIS — H2512 Age-related nuclear cataract, left eye: Secondary | ICD-10-CM | POA: Diagnosis not present

## 2022-10-08 DIAGNOSIS — H2511 Age-related nuclear cataract, right eye: Secondary | ICD-10-CM | POA: Diagnosis not present

## 2022-10-08 MED ORDER — PREDNISOLONE ACETATE 1 % OP SUSP
1.0000 [drp] | Freq: Four times a day (QID) | OPHTHALMIC | 1 refills | Status: DC
  Filled 2022-10-08: qty 5, 25d supply, fill #0

## 2022-10-08 MED ORDER — BESIVANCE 0.6 % OP SUSP
OPHTHALMIC | 1 refills | Status: DC
  Filled 2022-10-08: qty 5, 30d supply, fill #0

## 2022-10-08 MED ORDER — KETOROLAC TROMETHAMINE 0.5 % OP SOLN
1.0000 [drp] | Freq: Four times a day (QID) | OPHTHALMIC | 1 refills | Status: DC
  Filled 2022-10-08: qty 5, 25d supply, fill #0
  Filled 2023-04-08 – 2023-04-12 (×2): qty 5, 25d supply, fill #1

## 2022-10-12 ENCOUNTER — Other Ambulatory Visit (HOSPITAL_BASED_OUTPATIENT_CLINIC_OR_DEPARTMENT_OTHER): Payer: Self-pay

## 2022-10-22 ENCOUNTER — Other Ambulatory Visit (HOSPITAL_BASED_OUTPATIENT_CLINIC_OR_DEPARTMENT_OTHER): Payer: Self-pay

## 2022-10-22 DIAGNOSIS — H2512 Age-related nuclear cataract, left eye: Secondary | ICD-10-CM | POA: Diagnosis not present

## 2022-11-05 ENCOUNTER — Other Ambulatory Visit (HOSPITAL_BASED_OUTPATIENT_CLINIC_OR_DEPARTMENT_OTHER): Payer: Self-pay

## 2022-11-05 ENCOUNTER — Other Ambulatory Visit: Payer: Self-pay | Admitting: Family Medicine

## 2022-11-05 MED ORDER — ATORVASTATIN CALCIUM 80 MG PO TABS
80.0000 mg | ORAL_TABLET | Freq: Every day | ORAL | 1 refills | Status: DC
  Filled 2022-11-05 – 2022-12-06 (×2): qty 90, 90d supply, fill #0
  Filled 2023-03-08: qty 90, 90d supply, fill #1

## 2022-11-09 ENCOUNTER — Telehealth (INDEPENDENT_AMBULATORY_CARE_PROVIDER_SITE_OTHER): Payer: Medicare Other | Admitting: Family Medicine

## 2022-11-09 DIAGNOSIS — D649 Anemia, unspecified: Secondary | ICD-10-CM | POA: Diagnosis not present

## 2022-11-09 DIAGNOSIS — G3184 Mild cognitive impairment, so stated: Secondary | ICD-10-CM | POA: Diagnosis not present

## 2022-11-09 DIAGNOSIS — R350 Frequency of micturition: Secondary | ICD-10-CM | POA: Diagnosis not present

## 2022-11-09 DIAGNOSIS — I5032 Chronic diastolic (congestive) heart failure: Secondary | ICD-10-CM | POA: Diagnosis not present

## 2022-11-09 DIAGNOSIS — E785 Hyperlipidemia, unspecified: Secondary | ICD-10-CM | POA: Diagnosis not present

## 2022-11-09 DIAGNOSIS — E1142 Type 2 diabetes mellitus with diabetic polyneuropathy: Secondary | ICD-10-CM | POA: Diagnosis not present

## 2022-11-09 DIAGNOSIS — N184 Chronic kidney disease, stage 4 (severe): Secondary | ICD-10-CM | POA: Diagnosis not present

## 2022-11-09 DIAGNOSIS — E034 Atrophy of thyroid (acquired): Secondary | ICD-10-CM

## 2022-11-09 DIAGNOSIS — E559 Vitamin D deficiency, unspecified: Secondary | ICD-10-CM

## 2022-11-09 DIAGNOSIS — Z794 Long term (current) use of insulin: Secondary | ICD-10-CM

## 2022-11-09 DIAGNOSIS — I10 Essential (primary) hypertension: Secondary | ICD-10-CM

## 2022-11-09 DIAGNOSIS — E1159 Type 2 diabetes mellitus with other circulatory complications: Secondary | ICD-10-CM

## 2022-11-09 DIAGNOSIS — G4700 Insomnia, unspecified: Secondary | ICD-10-CM | POA: Diagnosis not present

## 2022-11-09 NOTE — Progress Notes (Signed)
MyChart Video Visit Virtual Visit via Video Note   This visit type was conducted due to national recommendations for restrictions regarding the COVID-19 Pandemic (e.g. social distancing) in an effort to limit this patient's exposure and mitigate transmission in our community. This patient is at least at moderate risk for complications without adequate follow up. This format is felt to be most appropriate for this patient at this time. Physical exam was limited by quality of the video and audio technology used for the visit. Shamaine, CMA, was able to get the patient set up on a video visit.  Patient location: Home Provider location: Office  I discussed the limitations of evaluation and management by telemedicine and the availability of in person appointments. The patient expressed understanding and agreed to proceed.  Visit Date: 11/09/2022  Today's healthcare provider: Penni Homans, MD     Subjective:    Patient ID: [name] [name], male    DOB: Mar 14, 1946, 76 y.o.   MRN: [id number]  No chief complaint on file.  HPI Patient is in today for a video visit.  He denies CP/ palp/ SOB/ HA/ congestion/fevers/GI or GU c/o.  Cataracts Surgery Patient recently underwent bilateral cataracts surgery and reports that his vision has significantly improved.  Memory Loss Patient complains of worsening memory loss across the past several months. He has been struggling with short term memory, forgetting activities he is about to do. He currently takes Donepezil 10 mg nightly which is prescribed by his neurologist Dr. April Manson.  Sleep Patient reports that Amitriptyline 10 mg has been extremely effective at helping him sleep. He has not taken this medication for the past 2 nights and has been unable to fall asleep.  Past Medical History:  Diagnosis Date   Acquired atrophy of thyroid 06/05/2013   Overview:  July 2014: controlled on synthroid 47mg since 2012 May 2015: decreased to Synthroid 50  mcg  Last Assessment & Plan:  Pt doing well with most recent TSH within normal range.  Will continue current dosage of Synthroid 515m. Pt reminded to take medication on empty stomach. Will continue to monitor with periodic laboratory assessment in 3 months.    Anemia    hemoglobin 7.4, iron deficiency, January, 2011, 2 unit transfusion, endoscopy normal, capsule endoscopy February, 2011 no small bowel abnormalities.   Most likely source gastric erosions, followed by GI   Anxiety    Bradycardia    CAD (coronary artery disease)    A. CABG in 2000,status post cardiac cath in 2006, 2009 ....continued chest pain and SOB despite oral medication adjestments including Ranexa. B. Cath November 2009/ mRCA - 2.75 x 23 Abbott Xience V drug-eluting stent ...11/26/2008 to distal  RCA leading to acute marginal.  C. Cath 07/2012 for CP - stable anatomy, med rx. d. cath 2015 and 05/30/2015 stable anatomy, consider Myoview if has CP again   Carotid artery disease ([facility]/26/2016   Doppler, May 29, 2015, 1-39% bilateral ICA    Cerebral ischemia    MRI November, 2010, chronic microvascular ischemia   CKD (chronic kidney disease), stage III ([facility]   Concussion    Depression    Bipolar   Diabetes mellitus ([facility] [place]   Edema    Essential hypertension 06/05/2013   Overview:  July 2014: Controlled with Bystolic Sep [id number] to atenolol.  Last Assessment & Plan:  Will change to atenolol for cost and follow Improved.  Medication compliance strongly encouraged BP: 116/64 mmHg    Overview:  July 2014:  Controlled with Bystolic Sep 4235: Changed to atenolol.  Last Assessment & Plan:  Will change to atenolol for cost and follow   Falling episodes    these have occurred in the past and again recurring 2011   Family history of adverse reaction to anesthesia    "mother died during bypass surgery but not sure if it has to do with anesthesia"   Gastric ulcer    GERD (gastroesophageal reflux disease)    H/O medication  noncompliance    Due to loss of insurance   H/O multiple concussions    Hard of hearing    Heart murmur    History of blood transfusion 12/20/2013   History of kidney stones    Hx of CABG    2000,  / one median sternotomy suture broken her chest x-ray November, 2010, no clinical significance   Hyperlipidemia    Hypertension    pt. denies   Iron deficiency anemia    Long term (current) use of anticoagulants [Z79.01] 07/20/2016   Low back pain 06/12/2009   Qualifier: Diagnosis of  By: [name]    Nephrolithiasis    Orthostasis    OSA (obstructive sleep apnea)    PAF (paroxysmal atrial fibrillation) ([place])    a. dx 2017, started on amiodarone/warfarin but patient intermittently noncompliant.   PSVT (paroxysmal supraventricular tachycardia)    RBBB 07/09/2009   Qualifier: Diagnosis of  By: [name], MD, [facility], [name]    RLS (restless legs syndrome) 09/19/2009   Qualifier: Diagnosis of  By: [name]   Overview:  July 2014: Controlled with Mirapex  Last Assessment & Plan:  Patient is doing well. Will continue current management and follow clinically.   Seizure disorder ([place]) 01/09/2017   Spondylosis    C5-6, C6-7 MRI 2010   Syncope 03/2016   TBI (traumatic brain injury) ([place]) 2019   Thyroid disease    Tubulovillous adenoma of colon 2007   Vitamin D deficiency 05/17/2017   Wears glasses    Past Surgical History:  Procedure Laterality Date   ANTERIOR CERVICAL DECOMP/DISCECTOMY FUSION N/A 05/31/2016   Procedure: ANTERIOR CERVICAL DECOMPRESSION/DISCECTOMY FUSION CERVICAL FIVE-SIX,CERVICAL SIX-SEVEN;  Surgeon: [name], MD;  Location: [place] [facility]URO ORS;  Service: Neurosurgery;  Laterality: N/A;   CARDIAC CATHETERIZATION N/A 05/30/2015   Procedure: Left Heart Cath and Coronary Angiography;  Surgeon: [name], MD;  Location: [place] CV LAB;  Service: Cardiovascular;  Laterality: N/A;   COLONOSCOPY     CORONARY ARTERY BYPASS GRAFT     2000    ESOPHAGOGASTRODUODENOSCOPY     LEFT HEART CATH AND CORS/GRAFTS ANGIOGRAPHY N/A 12/15/2017   Procedure: LEFT HEART CATH AND CORS/GRAFTS ANGIOGRAPHY;  Surgeon: [name], MD;  Location: [place] CV LAB;  Service: Cardiovascular;  Laterality: N/A;   LEFT HEART CATHETERIZATION WITH CORONARY/GRAFT ANGIOGRAM N/A 08/01/2012   Procedure: LEFT HEART CATHETERIZATION WITH Beatrix Fetters;  Surgeon: [name], MD;  Location: [facility];  Service: Cardiovascular;  Laterality: N/A;   LEFT HEART CATHETERIZATION WITH CORONARY/GRAFT ANGIOGRAM N/A 01/03/2015   Procedure: LEFT HEART CATHETERIZATION WITH Beatrix Fetters;  Surgeon: [name], MD;  Location: [facility] CATH LAB;  Service: Cardiovascular;  Laterality: N/A;   NASAL SEPTUM SURGERY     UP3   PERCUTANEOUS CORONARY STENT INTERVENTION (PCI-S)  10/2008   mRCA PCI  2.75 x 23 Abbott Xience V drug-eluting stent    RIGHT/LEFT HEART CATH AND CORONARY/GRAFT ANGIOGRAPHY N/A 01/07/2020   Procedure:  RIGHT/LEFT HEART CATH AND CORONARY/GRAFT ANGIOGRAPHY;  Surgeon: [name], MD;  Location: [place] CV LAB;  Service: Cardiovascular;  Laterality: N/A;   TOTAL KNEE ARTHROPLASTY Right 09/22/2021   Procedure: RIGHT TOTAL KNEE ARTHROPLASTY;  Surgeon: [name], MD;  Location: [facility];  Service: Orthopedics;  Laterality: Right;   ULTRASOUND GUIDANCE FOR VASCULAR ACCESS  12/15/2017   Procedure: Ultrasound Guidance For Vascular Access;  Surgeon: [name], MD;  Location: [place] CV LAB;  Service: Cardiovascular;;   Family History  Problem Relation Age of Onset   Pancreatic cancer Brother    Diabetes Brother    Coronary artery disease Brother    Stroke Brother    Diabetes Brother    Diabetes Mother    Heart failure Mother    Heart failure Father    Hypothyroidism Brother    Coronary artery disease Brother    Other Brother        colon surgery   Heart attack Other        Nephew   Irregular heart beat  Daughter    Cancer Maternal Grandmother        unknown    Colon cancer Neg Hx    Stomach cancer Neg Hx    Liver cancer Neg Hx    Rectal cancer Neg Hx    Esophageal cancer Neg Hx    Social History   Socioeconomic History   Marital status: Married    Spouse name: Not on file   Number of children: 4   Years of education: 58   Highest education level: Not on file  Occupational History   Occupation: retired  Tobacco Use   Smoking status: Never    Passive exposure: Never   Smokeless tobacco: Never  Vaping Use   Vaping Use: Never used  Substance and Sexual Activity   Alcohol use: No    Alcohol/week: 0.0 standard drinks of alcohol    Comment: stopped drinking in 1998   Drug use: No   Sexual activity: Not Currently    Partners: Female  Other Topics Concern   Not on file  Social History Narrative   Patient is right handed.   Patient does not drink caffeine.   Social Determinants of Health   Financial Resource Strain: Medium Risk (08/02/2022)   Overall Financial Resource Strain (CARDIA)    Difficulty of Paying Living Expenses: Somewhat hard  Food Insecurity: No Food Insecurity (08/02/2022)   Hunger Vital Sign    Worried About Running Out of Food in the Last Year: Never true    Ran Out of Food in the Last Year: Never true  Transportation Needs: No Transportation Needs (08/02/2022)   PRAPARE - Hydrologist (Medical): No    Lack of Transportation (Non-Medical): No  Physical Activity: Insufficiently Active (08/02/2022)   Exercise Vital Sign    Days of Exercise per Week: 7 days    Minutes of Exercise per Session: 20 min  Stress: Stress Concern Present (08/02/2022)   [place]    Feeling of Stress : To some extent  Social Connections: Moderately Isolated (08/02/2022)   Social Connection and Isolation Panel [NHANES]    Frequency of Communication with Friends and Family: More than three  times a week    Frequency of Social Gatherings with Friends and Family: More than three times a week    Attends Religious Services: More than 4 times per year  Active Member of Clubs or Organizations: No    Attends Archivist Meetings: Never    Marital Status: Widowed  Intimate Partner Violence: Not At Risk (08/02/2022)   Humiliation, Afraid, Rape, and Kick questionnaire    Fear of Current or Ex-Partner: No    Emotionally Abused: No    Physically Abused: No    Sexually Abused: No   Outpatient Medications Prior to Visit  Medication Sig Dispense Refill   amiodarone (PACERONE) 200 MG tablet Take 1 tablet (200 mg total) by mouth daily. 90 tablet 3   amitriptyline (ELAVIL) 10 MG tablet Take 1-3 tablets (10-30 mg total) by mouth at bedtime. 90 tablet 1   amoxicillin (AMOXIL) 500 MG capsule Take 4 capsules (2,000 mg total) by mouth 1 hour before dental procedure. 4 capsule 0   apixaban (ELIQUIS) 5 MG TABS tablet Take 1 tablet (5 mg total) by mouth 2 (two) times daily. 60 tablet 5   aspirin EC 81 MG tablet Take 81 mg by mouth daily. Swallow whole.     atorvastatin (LIPITOR) 80 MG tablet Take 1 tablet (80 mg total) by mouth daily. 90 tablet 1   BESIVANCE 0.6 % SUSP Instill 1 drop into right eye three times a day as directed. Please store upside down!! 5 mL 1   BESIVANCE 0.6 % SUSP Instill 1 drop into left eye three times a day as directed 5 mL 1   Continuous Blood Gluc Sensor (FREESTYLE LIBRE 2 SENSOR) MISC Use as instructed to check blood sugar, change every 14 days 6 each 3   COVID-19 mRNA vaccine 2023-2024 (COMIRNATY) syringe Inject into the muscle. 0.3 mL 0   Diclofenac Sodium (PENNSAID) 2 % SOLN Place 1 application onto the skin 2 (two) times daily. 112 g 2   divalproex (DEPAKOTE) 500 MG DR tablet Take 1 tablet (500 mg total) by mouth 2 (two) times daily. 60 tablet 5   donepezil (ARICEPT) 10 MG tablet Take 1 tablet (10 mg total) by mouth at bedtime. 90 tablet 4   fenofibrate 160 MG  tablet Take 1 tablet (160 mg total) by mouth daily. 30 tablet 0   glucose blood (ACCU-CHEK AVIVA) test strip Use as directed to check blood sugar 4 times daily.  DX E11.9 200 each 6   hydrocortisone 2.5 % cream Apply on to the skin 2 (two) times daily. 30 g 1   hydrOXYzine (ATARAX) 10 MG tablet Take 1 tablet (10 mg total) by mouth 3 (three) times daily as needed for anxiety or itching. 30 tablet 1   influenza vaccine adjuvanted (FLUAD QUADRIVALENT) 0.5 ML injection Inject into the muscle. 0.5 mL 0   insulin glargine, 2 Unit Dial, (TOUJEO MAX SOLOSTAR) 300 UNIT/ML Solostar Pen Inject 65 Units into the skin daily in the afternoon. 30 mL 2   insulin lispro (HUMALOG KWIKPEN) 100 UNIT/ML KwikPen Inject 10 Units into the skin 3 (three) times daily. 30 mL 3   Insulin Pen Needle 31G X 5 MM MISC Use as directed to inject insulin in the morning, at noon, in the evening, and at bedtime. 400 each 3   isosorbide mononitrate (IMDUR) 120 MG 24 hr tablet Take 1 tablet (120 mg total) by mouth daily. 90 tablet 3   ketorolac (ACULAR) 0.5 % ophthalmic solution Place 1 drop into the right eye 4 times a day as directed 5 mL 1   ketorolac (ACULAR) 0.5 % ophthalmic solution Place 1 drop into the left eye 4 (four) times daily. 5 mL  1   levocetirizine (XYZAL) 5 MG tablet Take 1 tablet (5 mg total) by mouth every evening. 30 tablet 2   levothyroxine (SYNTHROID) 100 MCG tablet TAKE 1 TABLET (100 MCG TOTAL) BY MOUTH DAILY. 90 tablet 1   meclizine (ANTIVERT) 25 MG tablet Take 1 tablet (25 mg total) by mouth 3 (three) times daily as needed for dizziness. 30 tablet 0   nitroGLYCERIN (NITROSTAT) 0.4 MG SL tablet PLACE 1 TABLET UNDER THE TONGUE EVERY 5 MINUTES AS NEEDED FOR CHEST PAIN. 25 tablet 1   pantoprazole (PROTONIX) 40 MG tablet Take 1 tablet (40 mg total) by mouth daily. 90 tablet 1   permethrin (ELIMITE) 5 % cream Apply cream head to bottom soles of feet at bedtime and wash off in morning, can repeat once in 1-2 weeks  (Patient not taking: Reported on 09/14/2022) 60 g 1   pramipexole (MIRAPEX) 1 MG tablet Take 1/2 to 1 tablet by mouth at bedtime 90 tablet 0   prednisoLONE acetate (PRED FORTE) 1 % ophthalmic suspension Place 1 drop into the right eye 4 times a day as directed. Please shake bottle well before each use. 5 mL 1   prednisoLONE acetate (PRED FORTE) 1 % ophthalmic suspension Place 1 drop into the left eye 4 (four) times daily. 5 mL 1   ranolazine (RANEXA) 500 MG 12 hr tablet TAKE 1 TABLET BY MOUTH TWICE DAILY 60 tablet 5   sertraline (ZOLOFT) 100 MG tablet Take 1 tablet (100 mg total) by mouth 2 (two) times daily. 180 tablet 0   triamcinolone cream (KENALOG) 0.1 % Apply 1 application topically 2 (two) times daily. Use as needed for itchy rash 30 g 1   Vitamin D, Ergocalciferol, (DRISDOL) 1.25 MG (50000 UNIT) CAPS capsule TAKE 1 CAPSULE BY MOUTH EVERY 7 DAYS 4 capsule 4   No facility-administered medications prior to visit.   Allergies  Allergen Reactions   Morphine Other (See Comments)    Hallucinations   Shellfish-Derived Products Itching  ROS    Objective:    Physical Exam  There were no vitals taken for this visit. Wt Readings from Last 3 Encounters:  09/21/22 179 lb (81.2 kg)  09/14/22 178 lb (80.7 kg)  09/06/22 180 lb (81.6 kg)   Diabetic Foot Exam - Simple   No data filed    Lab Results  Component Value Date   WBC 7.0 09/06/2022   HGB 13.0 09/06/2022   HCT 38.5 (L) 09/06/2022   PLT 172 09/06/2022   GLUCOSE 135 (H) 09/06/2022   CHOL 101 06/29/2021   TRIG 118.0 06/29/2021   HDL 31.40 (L) 06/29/2021   LDLDIRECT 77.0 08/20/2019   LDLCALC 46 06/29/2021   ALT 17 04/27/2022   AST 20 04/27/2022   NA 137 09/06/2022   K 4.1 09/06/2022   CL 104 09/06/2022   CREATININE 1.38 (H) 09/06/2022   BUN 24 (H) 09/06/2022   CO2 24 09/06/2022   TSH 3.42 06/29/2021   PSA 3.14 08/20/2019   INR 1.2 06/16/2022   HGBA1C 9.4 (A) 06/01/2022   MICROALBUR <0.7 05/14/2019   Lab Results   Component Value Date   TSH 3.42 06/29/2021   Lab Results  Component Value Date   WBC 7.0 09/06/2022   HGB 13.0 09/06/2022   HCT 38.5 (L) 09/06/2022   MCV 87.7 09/06/2022   PLT 172 09/06/2022   Lab Results  Component Value Date   NA 137 09/06/2022   K 4.1 09/06/2022   CO2 24 09/06/2022  GLUCOSE 135 (H) 09/06/2022   BUN 24 (H) 09/06/2022   CREATININE 1.38 (H) 09/06/2022   BILITOT 0.6 04/27/2022   ALKPHOS 91 04/27/2022   AST 20 04/27/2022   ALT 17 04/27/2022   PROT 6.6 04/27/2022   ALBUMIN 4.3 04/27/2022   CALCIUM 9.6 09/06/2022   ANIONGAP 9 09/06/2022   GFR 56.00 (L) 04/27/2022   Lab Results  Component Value Date   CHOL 101 06/29/2021   Lab Results  Component Value Date   HDL 31.40 (L) 06/29/2021   Lab Results  Component Value Date   LDLCALC 46 06/29/2021   Lab Results  Component Value Date   TRIG 118.0 06/29/2021   Lab Results  Component Value Date   CHOLHDL 3 06/29/2021   Lab Results  Component Value Date   HGBA1C 9.4 (A) 06/01/2022      Assessment & Plan:   Labs An appointment for routine blood work has been made.   Problem List Items Addressed This Visit   None  No orders of the defined types were placed in this encounter.  I discussed the assessment and treatment plan with the patient. The patient was provided an opportunity to ask questions and all were answered. The patient agreed with the plan and demonstrated an understanding of the instructions.   The patient was advised to call back or seek an in-person evaluation if the symptoms worsen or if the condition fails to improve as anticipated.  I provided 20 minutes of face-to-face time during this encounter.  I,Mohammed Iqbal,acting as a scribe for Penni Homans, MD.,have documented all relevant documentation on the behalf of Penni Homans, MD,as directed by  Penni Homans, MD while in the presence of Penni Homans, MD.  Penni Homans, MD [facility] at [facility] [phone] (phone) [phone] (fax)  [place]

## 2022-11-10 ENCOUNTER — Telehealth: Payer: Self-pay

## 2022-11-10 NOTE — Assessment & Plan Note (Signed)
Encourage heart healthy diet such as MIND or DASH diet, increase exercise, avoid trans fats, simple carbohydrates and processed foods, consider a krill or fish or flaxseed oil cap daily. Tolerating Atorvastatin 

## 2022-11-10 NOTE — Assessment & Plan Note (Signed)
On Levothyroxine, continue to monitor 

## 2022-11-10 NOTE — Assessment & Plan Note (Signed)
hgba1c acceptable, minimize simple carbs. Increase exercise as tolerated. Continue current meds 

## 2022-11-10 NOTE — Assessment & Plan Note (Signed)
Good response to Amitriptyline, will continue

## 2022-11-10 NOTE — Assessment & Plan Note (Signed)
He is following with neurology and he is worried about progressive memory loss. He will follow up with neurology and continue current meds for now

## 2022-11-10 NOTE — Assessment & Plan Note (Signed)
Hydrate and monitor 

## 2022-11-10 NOTE — Assessment & Plan Note (Signed)
Stable, no exacerbation, no changes, continue to follow with cardiology

## 2022-11-10 NOTE — Telephone Encounter (Signed)
Called pt lab appt made follow up  Appt made

## 2022-11-12 ENCOUNTER — Other Ambulatory Visit (INDEPENDENT_AMBULATORY_CARE_PROVIDER_SITE_OTHER): Payer: Medicare Other

## 2022-11-12 ENCOUNTER — Other Ambulatory Visit (HOSPITAL_BASED_OUTPATIENT_CLINIC_OR_DEPARTMENT_OTHER): Payer: Self-pay

## 2022-11-12 DIAGNOSIS — I10 Essential (primary) hypertension: Secondary | ICD-10-CM

## 2022-11-12 DIAGNOSIS — Z794 Long term (current) use of insulin: Secondary | ICD-10-CM | POA: Diagnosis not present

## 2022-11-12 DIAGNOSIS — E785 Hyperlipidemia, unspecified: Secondary | ICD-10-CM | POA: Diagnosis not present

## 2022-11-12 DIAGNOSIS — E559 Vitamin D deficiency, unspecified: Secondary | ICD-10-CM

## 2022-11-12 DIAGNOSIS — E1142 Type 2 diabetes mellitus with diabetic polyneuropathy: Secondary | ICD-10-CM

## 2022-11-12 DIAGNOSIS — R350 Frequency of micturition: Secondary | ICD-10-CM

## 2022-11-12 LAB — LIPID PANEL
Cholesterol: 95 mg/dL (ref 0–200)
HDL: 41 mg/dL (ref >39.00)
LDL Cholesterol: 37 mg/dL (ref 0–99)
NonHDL: 53.64
Total CHOL/HDL Ratio: 2
Triglycerides: 81 mg/dL (ref 0.0–149.0)
VLDL: 16.2 mg/dL (ref 0.0–40.0)

## 2022-11-12 LAB — COMPREHENSIVE METABOLIC PANEL
ALT: 31 U/L (ref 0–53)
AST: 27 U/L (ref 0–37)
Albumin: 4.3 g/dL (ref 3.5–5.2)
Alkaline Phosphatase: 95 U/L (ref 39–117)
BUN: 21 mg/dL (ref 6–23)
CO2: 29 mEq/L (ref 19–32)
Calcium: 9 mg/dL (ref 8.4–10.5)
Chloride: 104 mEq/L (ref 96–112)
Creatinine, Ser: 1.36 mg/dL (ref 0.40–1.50)
GFR: 51.39 mL/min — ABNORMAL LOW (ref >60.00)
Glucose, Bld: 185 mg/dL — ABNORMAL HIGH (ref 70–99)
Potassium: 4.1 mEq/L (ref 3.5–5.1)
Sodium: 140 mEq/L (ref 135–145)
Total Bilirubin: 0.6 mg/dL (ref 0.2–1.2)
Total Protein: 6.7 g/dL (ref 6.0–8.3)

## 2022-11-12 LAB — CBC
HCT: 40.8 % (ref 39.0–52.0)
Hemoglobin: 14.1 g/dL (ref 13.0–17.0)
MCHC: 34.6 g/dL (ref 30.0–36.0)
MCV: 87.8 fl (ref 78.0–100.0)
Platelets: 164 10*3/uL (ref 150.0–400.0)
RBC: 4.65 Mil/uL (ref 4.22–5.81)
RDW: 15.1 % (ref 11.5–15.5)
WBC: 7.1 10*3/uL (ref 4.0–10.5)

## 2022-11-12 LAB — VITAMIN D 25 HYDROXY (VIT D DEFICIENCY, FRACTURES): VITD: 28.02 ng/mL — ABNORMAL LOW (ref 30.00–100.00)

## 2022-11-12 LAB — URINALYSIS
Bilirubin Urine: NEGATIVE
Hgb urine dipstick: NEGATIVE
Ketones, ur: NEGATIVE
Leukocytes,Ua: NEGATIVE
Nitrite: NEGATIVE
Specific Gravity, Urine: 1.025 (ref 1.000–1.030)
Total Protein, Urine: NEGATIVE
Urine Glucose: 250 — AB
Urobilinogen, UA: 0.2 (ref 0.0–1.0)
pH: 6 (ref 5.0–8.0)

## 2022-11-12 LAB — HEMOGLOBIN A1C: Hgb A1c MFr Bld: 8.6 % — ABNORMAL HIGH (ref 4.6–6.5)

## 2022-11-12 LAB — TSH: TSH: 13.42 u[IU]/mL — ABNORMAL HIGH (ref 0.35–5.50)

## 2022-11-13 LAB — URINE CULTURE
MICRO NUMBER:: 14289840
Result:: NO GROWTH
SPECIMEN QUALITY:: ADEQUATE

## 2022-11-18 ENCOUNTER — Other Ambulatory Visit: Payer: Self-pay | Admitting: Interventional Cardiology

## 2022-11-18 ENCOUNTER — Other Ambulatory Visit (HOSPITAL_BASED_OUTPATIENT_CLINIC_OR_DEPARTMENT_OTHER): Payer: Self-pay

## 2022-11-18 DIAGNOSIS — I4821 Permanent atrial fibrillation: Secondary | ICD-10-CM

## 2022-11-18 MED ORDER — APIXABAN 5 MG PO TABS
5.0000 mg | ORAL_TABLET | Freq: Two times a day (BID) | ORAL | 5 refills | Status: DC
  Filled 2022-11-18: qty 60, 30d supply, fill #0
  Filled 2023-01-06 – 2023-03-08 (×2): qty 60, 30d supply, fill #1
  Filled 2023-04-07: qty 60, 30d supply, fill #2
  Filled 2023-05-02: qty 60, 30d supply, fill #3
  Filled 2023-05-30: qty 60, 30d supply, fill #4
  Filled 2023-06-27 – 2023-06-29 (×3): qty 60, 30d supply, fill #5

## 2022-11-18 NOTE — Telephone Encounter (Signed)
Prescription refill request for Eliquis received. Indication: Afib  Last office visit: 09/14/09/23 [place])  Scr: 1.36(11/12/22)  Age: 76 Weight: 81.2kg  Appropriate dose and refill sent to requested pharmacy.

## 2022-11-22 ENCOUNTER — Other Ambulatory Visit (HOSPITAL_BASED_OUTPATIENT_CLINIC_OR_DEPARTMENT_OTHER): Payer: Self-pay

## 2022-11-23 ENCOUNTER — Other Ambulatory Visit (HOSPITAL_BASED_OUTPATIENT_CLINIC_OR_DEPARTMENT_OTHER): Payer: Self-pay

## 2022-12-07 ENCOUNTER — Other Ambulatory Visit: Payer: Self-pay

## 2022-12-07 ENCOUNTER — Other Ambulatory Visit (HOSPITAL_BASED_OUTPATIENT_CLINIC_OR_DEPARTMENT_OTHER): Payer: Self-pay

## 2022-12-15 ENCOUNTER — Other Ambulatory Visit (HOSPITAL_BASED_OUTPATIENT_CLINIC_OR_DEPARTMENT_OTHER): Payer: Self-pay

## 2022-12-16 ENCOUNTER — Other Ambulatory Visit (HOSPITAL_BASED_OUTPATIENT_CLINIC_OR_DEPARTMENT_OTHER): Payer: Self-pay

## 2022-12-21 ENCOUNTER — Encounter: Payer: Self-pay | Admitting: Family Medicine

## 2022-12-21 ENCOUNTER — Other Ambulatory Visit (HOSPITAL_BASED_OUTPATIENT_CLINIC_OR_DEPARTMENT_OTHER): Payer: Self-pay

## 2022-12-21 ENCOUNTER — Ambulatory Visit: Payer: Medicare Other | Admitting: Family Medicine

## 2022-12-21 ENCOUNTER — Other Ambulatory Visit: Payer: Self-pay | Admitting: Family Medicine

## 2022-12-21 ENCOUNTER — Ambulatory Visit (HOSPITAL_BASED_OUTPATIENT_CLINIC_OR_DEPARTMENT_OTHER)
Admission: RE | Admit: 2022-12-21 | Discharge: 2022-12-21 | Disposition: A | Discharge status: Home / Self Care | Payer: Medicare Other | Source: Ambulatory Visit | Attending: Family Medicine | Admitting: Family Medicine

## 2022-12-21 VITALS — BP 134/54 | HR 54 | Temp 97.6°F | Resp 16 | Ht 68.0 in | Wt 176.6 lb

## 2022-12-21 DIAGNOSIS — F32A Depression, unspecified: Secondary | ICD-10-CM

## 2022-12-21 DIAGNOSIS — G3184 Mild cognitive impairment, so stated: Secondary | ICD-10-CM

## 2022-12-21 DIAGNOSIS — L309 Dermatitis, unspecified: Secondary | ICD-10-CM | POA: Diagnosis not present

## 2022-12-21 DIAGNOSIS — R053 Chronic cough: Secondary | ICD-10-CM | POA: Diagnosis not present

## 2022-12-21 DIAGNOSIS — G2581 Restless legs syndrome: Secondary | ICD-10-CM

## 2022-12-21 DIAGNOSIS — G4700 Insomnia, unspecified: Secondary | ICD-10-CM

## 2022-12-21 DIAGNOSIS — R059 Cough, unspecified: Secondary | ICD-10-CM | POA: Diagnosis not present

## 2022-12-21 DIAGNOSIS — F419 Anxiety disorder, unspecified: Secondary | ICD-10-CM

## 2022-12-21 MED ORDER — HYDROCORTISONE 2.5 % EX CREA
TOPICAL_CREAM | Freq: Two times a day (BID) | CUTANEOUS | 1 refills | Status: DC
  Filled 2022-12-21: qty 30, 15d supply, fill #0
  Filled 2023-02-23: qty 30, 30d supply, fill #0
  Filled 2023-03-14 – 2023-03-18 (×2): qty 30, 30d supply, fill #1

## 2022-12-21 MED ORDER — AMITRIPTYLINE HCL 10 MG PO TABS
10.0000 mg | ORAL_TABLET | Freq: Every day | ORAL | 1 refills | Status: DC
  Filled 2022-12-21 – 2023-04-20 (×3): qty 90, 30d supply, fill #0
  Filled 2023-05-30: qty 90, 30d supply, fill #1

## 2022-12-21 MED ORDER — PRAMIPEXOLE DIHYDROCHLORIDE 1 MG PO TABS
0.5000 mg | ORAL_TABLET | Freq: Every day | ORAL | 0 refills | Status: DC
  Filled 2022-12-21 – 2023-03-08 (×2): qty 90, 90d supply, fill #0

## 2022-12-21 NOTE — Progress Notes (Signed)
Acute Office Visit  Subjective:     Patient ID: [name] [name], male    DOB: August 11, 1946, 77 y.o.   MRN: [id number]  Chief Complaint  Patient presents with   Referral    Neurology    HPI Patient is in today for memory concerns. He is here with his daughter.   She reports that his memory/mood has been gradually changing over the past 2 years, but last couple of months have been more of a rapid change. He is forgetting things easily, being more irritable/aggitated, and anxiety seems worse. He continues to struggle with sleeping issues; he will stay up until 4am and then finally fall asleep and sleep until the afternoon. He had a virtual visit with PCP to discuss this a few months ago and she prescribed amitriptyline, but he reports he was not aware of the new prescription. He admits to being more forgetfil. For example, he will get up from the living room to go make some lunch, but by the time he gets to the kitchen he forgets what he was doing. Reports he had a concussion 4-5 years ago with mild cognitive impairment and was following with [facility], but given the recent worsening, they would like a second opinion. He denies any SI/HI. Currently taking 100 mg of Zoloft twice a day.  Additionally, he reports that he has had a chronic dry couhg for hte past 3 months or so. States it has not had any sputum production, URI symtpoms, chets pain, dyspnea, fever, weight loss, etc. Never a smoker. Reports he does have some environmental alelriges. Denies any reflux symtpoms.      12/21/2022   10:40 AM 09/21/2022    4:54 PM 08/06/2022   10:27 AM  PHQ9 SCORE ONLY  PHQ-9 Total Score 25 0 0      12/21/2022   10:40 AM  GAD 7 : Generalized Anxiety Score  Nervous, Anxious, on Edge 3  Control/stop worrying 3  Worry too much - different things 3  Trouble relaxing 3  Restless 3  Easily annoyed or irritable 3  Afraid - awful might happen 3  Total GAD 7 Score 21  Anxiety Difficulty Extremely difficult          ROS All review of systems negative except what is listed in the HPI      Objective:    BP (!) 134/54   Pulse (!) 54   Temp 97.6 F (36.4 C)   Resp 16   Ht '5\' 8"'$  (1.727 m)   Wt 176 lb 9.6 oz (80.1 kg)   SpO2 98%   BMI 26.85 kg/m    Physical Exam Vitals reviewed.  Constitutional:      Appearance: Normal appearance.  Cardiovascular:     Rate and Rhythm: Normal rate and regular rhythm.     Pulses: Normal pulses.     Heart sounds: Normal heart sounds.  Pulmonary:     Effort: Pulmonary effort is normal.     Breath sounds: Normal breath sounds.  Skin:    General: Skin is warm and dry.  Neurological:     Mental Status: He is alert and oriented to person, place, and time.  Psychiatric:        Mood and Affect: Mood normal.        Behavior: Behavior normal.        Thought Content: Thought content normal.        Judgment: Judgment normal.     No results found for  any visits on 12/21/22.      Assessment & Plan:   Problem List Items Addressed This Visit       Musculoskeletal and Integument   Dermatitis Stable, at baseline. Refill provided.    Relevant Medications   hydrocortisone 2.5 % cream     Other   Insomnia Recommend starting the amitriptyline PCP previously prescribed - education provided.  Discussed sleep hygiene.    Relevant Medications   amitriptyline (ELAVIL) 10 MG tablet   MCI (mild cognitive impairment)   Relevant Orders   Ambulatory referral to Neurology   Other Visit Diagnoses     Anxiety and depression    -  Primary Continue Zoloft. See if amitriptyline for sleep will provided any mood benefits as well. No SI/HI. Referral placed for med management and counseling.    Relevant Medications   amitriptyline (ELAVIL) 10 MG tablet   Other Relevant Orders   Ambulatory referral to Behavioral Health   Chronic cough     No alarm findings on exam CXR today - update plan as needed based on results Discussed potential triggers and  supportive measures - allergic rhinitis, reflux     Relevant Orders   DG Chest 2 View       Meds ordered this encounter  Medications   amitriptyline (ELAVIL) 10 MG tablet    Sig: Take 1-3 tablets (10-30 mg total) by mouth at bedtime.    Dispense:  90 tablet    Refill:  1    Order Specific Question:   Supervising Provider    Answer:   Penni Homans A [[id number]]   hydrocortisone 2.5 % cream    Sig: Apply on to the skin 2 (two) times daily.    Dispense:  30 g    Refill:  1    Order Specific Question:   Supervising Provider    Answer:   [name] [[id number]]    Return for keep upcoming PCP appointment .  Terrilyn Saver, NP

## 2022-12-21 NOTE — Patient Instructions (Addendum)
Try the amitriptyline Dr. Charlett Blake previously prescribed - will send new prescription for you.  Referral to behavioral health and neuro to further evaluate Refilling your hydrocortisone for dermatitis  Cough - xray today. Try focusing on allergy and reflux supportive measures.

## 2022-12-22 ENCOUNTER — Other Ambulatory Visit (HOSPITAL_BASED_OUTPATIENT_CLINIC_OR_DEPARTMENT_OTHER): Payer: Self-pay

## 2022-12-28 ENCOUNTER — Telehealth: Payer: Self-pay

## 2022-12-28 ENCOUNTER — Other Ambulatory Visit: Payer: Self-pay

## 2022-12-28 DIAGNOSIS — G4700 Insomnia, unspecified: Secondary | ICD-10-CM

## 2022-12-28 DIAGNOSIS — R053 Chronic cough: Secondary | ICD-10-CM

## 2022-12-28 NOTE — Telephone Encounter (Signed)
Called pt was advised a referral was put in  And should hear from they office soon. Put urgent CPAP.

## 2023-01-03 ENCOUNTER — Other Ambulatory Visit (HOSPITAL_BASED_OUTPATIENT_CLINIC_OR_DEPARTMENT_OTHER): Payer: Self-pay

## 2023-01-05 ENCOUNTER — Other Ambulatory Visit: Payer: Self-pay

## 2023-01-05 ENCOUNTER — Emergency Department (HOSPITAL_COMMUNITY)
Admission: EM | Admit: 2023-01-05 | Discharge: 2023-01-05 | Disposition: A | Discharge status: Home / Self Care | Payer: Medicare Other | Attending: Emergency Medicine | Admitting: Emergency Medicine

## 2023-01-05 ENCOUNTER — Emergency Department (HOSPITAL_COMMUNITY): Payer: Medicare Other

## 2023-01-05 ENCOUNTER — Encounter (HOSPITAL_COMMUNITY): Payer: Self-pay

## 2023-01-05 DIAGNOSIS — Z79899 Other long term (current) drug therapy: Secondary | ICD-10-CM | POA: Insufficient documentation

## 2023-01-05 DIAGNOSIS — E1122 Type 2 diabetes mellitus with diabetic chronic kidney disease: Secondary | ICD-10-CM | POA: Diagnosis not present

## 2023-01-05 DIAGNOSIS — I209 Angina pectoris, unspecified: Secondary | ICD-10-CM | POA: Insufficient documentation

## 2023-01-05 DIAGNOSIS — Z7901 Long term (current) use of anticoagulants: Secondary | ICD-10-CM | POA: Diagnosis not present

## 2023-01-05 DIAGNOSIS — Z794 Long term (current) use of insulin: Secondary | ICD-10-CM | POA: Diagnosis not present

## 2023-01-05 DIAGNOSIS — Z743 Need for continuous supervision: Secondary | ICD-10-CM | POA: Diagnosis not present

## 2023-01-05 DIAGNOSIS — R0789 Other chest pain: Secondary | ICD-10-CM | POA: Diagnosis not present

## 2023-01-05 DIAGNOSIS — N183 Chronic kidney disease, stage 3 unspecified: Secondary | ICD-10-CM | POA: Insufficient documentation

## 2023-01-05 DIAGNOSIS — Z951 Presence of aortocoronary bypass graft: Secondary | ICD-10-CM | POA: Insufficient documentation

## 2023-01-05 DIAGNOSIS — Z7982 Long term (current) use of aspirin: Secondary | ICD-10-CM | POA: Diagnosis not present

## 2023-01-05 DIAGNOSIS — I2089 Other forms of angina pectoris: Secondary | ICD-10-CM

## 2023-01-05 DIAGNOSIS — R0602 Shortness of breath: Secondary | ICD-10-CM | POA: Diagnosis not present

## 2023-01-05 DIAGNOSIS — R079 Chest pain, unspecified: Secondary | ICD-10-CM | POA: Diagnosis not present

## 2023-01-05 DIAGNOSIS — R110 Nausea: Secondary | ICD-10-CM | POA: Diagnosis not present

## 2023-01-05 DIAGNOSIS — I129 Hypertensive chronic kidney disease with stage 1 through stage 4 chronic kidney disease, or unspecified chronic kidney disease: Secondary | ICD-10-CM | POA: Insufficient documentation

## 2023-01-05 LAB — CBC
HCT: 40.3 % (ref 39.0–52.0)
Hemoglobin: 13.7 g/dL (ref 13.0–17.0)
MCH: 30.6 pg (ref 26.0–34.0)
MCHC: 34 g/dL (ref 30.0–36.0)
MCV: 90.2 fL (ref 80.0–100.0)
Platelets: 170 10*3/uL (ref 150–400)
RBC: 4.47 MIL/uL (ref 4.22–5.81)
RDW: 14.7 % (ref 11.5–15.5)
WBC: 9.9 10*3/uL (ref 4.0–10.5)
nRBC: 0 % (ref 0.0–0.2)

## 2023-01-05 LAB — BASIC METABOLIC PANEL
Anion gap: 8 (ref 5–15)
BUN: 26 mg/dL — ABNORMAL HIGH (ref 8–23)
CO2: 25 mmol/L (ref 22–32)
Calcium: 8.9 mg/dL (ref 8.9–10.3)
Chloride: 104 mmol/L (ref 98–111)
Creatinine, Ser: 1.6 mg/dL — ABNORMAL HIGH (ref 0.61–1.24)
GFR, Estimated: 45 mL/min — ABNORMAL LOW (ref >60)
Glucose, Bld: 129 mg/dL — ABNORMAL HIGH (ref 70–99)
Potassium: 4.1 mmol/L (ref 3.5–5.1)
Sodium: 137 mmol/L (ref 135–145)

## 2023-01-05 LAB — TROPONIN I (HIGH SENSITIVITY)
Troponin I (High Sensitivity): 7 ng/L (ref <18)
Troponin I (High Sensitivity): 6 ng/L (ref <18)

## 2023-01-05 MED ORDER — SODIUM CHLORIDE 0.9 % IV BOLUS
500.0000 mL | Freq: Once | INTRAVENOUS | Status: AC
  Administered 2023-01-05: 500 mL via INTRAVENOUS

## 2023-01-05 MED ORDER — NITROGLYCERIN 0.4 MG SL SUBL: SUBLINGUAL_TABLET | SUBLINGUAL | 1 refills | Status: DC

## 2023-01-05 MED ORDER — NITROGLYCERIN 0.4 MG SL SUBL: 0.4000 mg | SUBLINGUAL_TABLET | SUBLINGUAL | Status: DC | PRN

## 2023-01-05 NOTE — ED Notes (Signed)
All discharge instructions reviewed with patient including follow up care and prescriptions. Patient verbalized understanding of same and had no other questions. Patient stable at time of discharge and wheel chair was provided to patient prior to leaving department.

## 2023-01-05 NOTE — ED Provider Notes (Signed)
[facility] Provider Note   CSN: [id number] Arrival date & time: 01/05/23  1625     History {Add pertinent medical, surgical, social history, OB history to HPI:1} Chief Complaint  Patient presents with   Chest Pain    [name] [name] is a 77 y.o. male with past medical history significant for coronary artery disease, history of CABG, diabetes, GERD, right bundle branch block, hypertension, seizure disorder, CKD stage III who presents with concern for chest pain, shortness of breath which are both exertional in nature when he moves around to get worse.  He endorses nausea, radiation of chest pain to his left neck, shoulder, arm.  Patient reports the symptoms began around 7 PM last night, he took 1 nitro last night and was able to get enough comfort to fall asleep.  Symptoms began this morning again when he started moving around, he received multiple nitro and route with EMS with some relief of his chest pain.  He rates still a dull chest pain on my exam.   Chest Pain      Home Medications Prior to Admission medications   Medication Sig Start Date End Date Taking? Authorizing Provider  amiodarone (PACERONE) 200 MG tablet Take 1 tablet (200 mg total) by mouth daily. 09/14/22   [name], MD  amitriptyline (ELAVIL) 10 MG tablet Take 1-3 tablets (10-30 mg total) by mouth at bedtime. 12/21/22   Terrilyn Saver, NP  amoxicillin (AMOXIL) 500 MG capsule Take 4 capsules (2,000 mg total) by mouth 1 hour before dental procedure. 07/12/22     apixaban (ELIQUIS) 5 MG TABS tablet Take 1 tablet (5 mg total) by mouth 2 (two) times daily. 11/18/22   [name], MD  aspirin EC 81 MG tablet Take 81 mg by mouth daily. Swallow whole.    [provider]  atorvastatin (LIPITOR) 80 MG tablet Take 1 tablet (80 mg total) by mouth daily. 11/05/22   [name], MD  BESIVANCE 0.6 % SUSP Instill 1 drop into right eye three times a day as  directed. Please store upside down!! 05/25/22     BESIVANCE 0.6 % SUSP Instill 1 drop into left eye three times a day as directed 10/08/22     Continuous Blood Gluc Sensor (FREESTYLE LIBRE 2 SENSOR) MISC Use as instructed to check blood sugar, change every 14 days 04/13/22   Shamleffer, Melanie Crazier, MD  COVID-19 mRNA vaccine [phone] (COMIRNATY) syringe Inject into the muscle. 09/27/22   Carlyle Basques, MD  Diclofenac Sodium (PENNSAID) 2 % SOLN Place 1 application onto the skin 2 (two) times daily. 10/17/20   Rosemarie Ax, MD  divalproex (DEPAKOTE) 500 MG DR tablet Take 1 tablet (500 mg total) by mouth 2 (two) times daily. 07/07/21   Kathrynn Ducking, MD  donepezil (ARICEPT) 10 MG tablet Take 1 tablet (10 mg total) by mouth at bedtime. 03/01/22 09/08/22  Alric Ran, MD  fenofibrate 160 MG tablet Take 1 tablet (160 mg total) by mouth daily. 02/15/22   Little Ishikawa, MD  glucose blood (ACCU-CHEK AVIVA) test strip Use as directed to check blood sugar 4 times daily.  DX E11.9 02/05/19   [name], MD  hydrocortisone 2.5 % cream Apply on to the skin 2 (two) times daily. 12/21/22 12/21/23  Terrilyn Saver, NP  hydrOXYzine (ATARAX) 10 MG tablet Take 1 tablet (10 mg total) by mouth 3 (three) times daily as needed for anxiety or  itching. 06/11/22   [name], MD  influenza vaccine adjuvanted (FLUAD QUADRIVALENT) 0.5 ML injection Inject into the muscle. 09/27/22   Carlyle Basques, MD  insulin glargine, 2 Unit Dial, (TOUJEO MAX SOLOSTAR) 300 UNIT/ML Solostar Pen Inject 65 Units into the skin daily in the afternoon. 04/13/22   Shamleffer, Melanie Crazier, MD  insulin lispro (HUMALOG KWIKPEN) 100 UNIT/ML KwikPen Inject 10 Units into the skin 3 (three) times daily. 04/13/22   Shamleffer, Melanie Crazier, MD  Insulin Pen Needle 31G X 5 MM MISC Use as directed to inject insulin in the morning, at noon, in the evening, and at bedtime. 04/13/22   Shamleffer, Melanie Crazier, MD  isosorbide mononitrate  (IMDUR) 120 MG 24 hr tablet Take 1 tablet (120 mg total) by mouth daily. 02/26/22   [name], MD  ketorolac (ACULAR) 0.5 % ophthalmic solution Place 1 drop into the right eye 4 times a day as directed 05/25/22   Darleen Crocker, MD  ketorolac (ACULAR) 0.5 % ophthalmic solution Place 1 drop into the left eye 4 (four) times daily. 10/08/22   Darleen Crocker, MD  levocetirizine (XYZAL) 5 MG tablet Take 1 tablet (5 mg total) by mouth every evening. 07/28/22   Nani Ravens, Crosby Oyster, DO  levothyroxine (SYNTHROID) 100 MCG tablet TAKE 1 TABLET (100 MCG TOTAL) BY MOUTH DAILY. 04/13/22 04/13/23  [name], MD  meclizine (ANTIVERT) 25 MG tablet Take 1 tablet (25 mg total) by mouth 3 (three) times daily as needed for dizziness. 05/27/22   [name], MD  nitroGLYCERIN (NITROSTAT) 0.4 MG SL tablet PLACE 1 TABLET UNDER THE TONGUE EVERY 5 MINUTES AS NEEDED FOR CHEST PAIN. 09/06/22   Cristie Hem, MD  pantoprazole (PROTONIX) 40 MG tablet Take 1 tablet (40 mg total) by mouth daily. 09/23/22   [name], MD  pramipexole (MIRAPEX) 1 MG tablet Take 1/2 to 1 tablet (0.5-1 mg total) by mouth at bedtime. 12/21/22   [name], MD  prednisoLONE acetate (PRED FORTE) 1 % ophthalmic suspension Place 1 drop into the right eye 4 times a day as directed. Please shake bottle well before each use. 05/25/22   Darleen Crocker, MD  prednisoLONE acetate (PRED FORTE) 1 % ophthalmic suspension Place 1 drop into the left eye 4 (four) times daily. 10/08/22   Darleen Crocker, MD  ranolazine (RANEXA) 500 MG 12 hr tablet TAKE 1 TABLET BY MOUTH TWICE DAILY 01/07/22 01/07/23  [name], MD  sertraline (ZOLOFT) 100 MG tablet Take 1 tablet (100 mg total) by mouth 2 (two) times daily. 10/07/22   [name], MD  triamcinolone cream (KENALOG) 0.1 % Apply 1 application topically 2 (two) times daily. Use as needed for itchy rash 05/22/21   [name], MD      Allergies    Morphine and Shellfish-derived products     Review of Systems   Review of Systems  Cardiovascular:  Positive for chest pain.  All other systems reviewed and are negative.   Physical Exam Updated Vital Signs BP (!) 146/65   Pulse (!) 57   Temp 98.5 F (36.9 C) (Oral)   Resp 11   SpO2 100%  Physical Exam Vitals and nursing note reviewed.  Constitutional:      General: He is not in acute distress.    Appearance: Normal appearance.  HENT:     Head: Normocephalic and atraumatic.  Eyes:     General:        Right eye:  No discharge.        Left eye: No discharge.  Cardiovascular:     Rate and Rhythm: Normal rate and regular rhythm.     Heart sounds: No murmur heard.    No friction rub. No gallop.  Pulmonary:     Effort: Pulmonary effort is normal.     Breath sounds: Normal breath sounds.  Abdominal:     General: Bowel sounds are normal.     Palpations: Abdomen is soft.  Skin:    General: Skin is warm and dry.     Capillary Refill: Capillary refill takes less than 2 seconds.  Neurological:     Mental Status: He is alert and oriented to person, place, and time.  Psychiatric:        Mood and Affect: Mood normal.        Behavior: Behavior normal.     ED Results / Procedures / Treatments   Labs (all labs ordered are listed, but only abnormal results are displayed) Labs Reviewed  CBC  BASIC METABOLIC PANEL  TROPONIN I (HIGH SENSITIVITY)    EKG EKG Interpretation  Date/Time:  Wednesday January 05 2023 16:53:22 EST Ventricular Rate:  58 PR Interval:  158 QRS Duration: 140 QT Interval:  473 QTC Calculation: 465 R Axis:   -3 Text Interpretation: Normal sinus rhythm Right bundle branch block Confirmed by Garnette Gunner [phone]) on 01/05/2023 4:58:41 PM  Radiology DG Chest 2 View  Result Date: 01/05/2023 CLINICAL DATA:  Chest and left arm pain EXAM: CHEST - 2 VIEW COMPARISON:  12/21/2022 FINDINGS: Status post median sternotomy and CABG. The heart size and mediastinal contours are within normal limits. No  focal pulmonary opacity. No pleural effusion or pneumothorax. Redemonstrated ACDF hardware. No acute osseous abnormality. IMPRESSION: No active cardiopulmonary disease. Electronically Signed   By: Merilyn Baba [name]   On: 01/05/2023 17:46    Procedures Procedures  {Document cardiac monitor, telemetry assessment procedure when appropriate:1}  Medications Ordered in ED Medications - No data to display  ED Course/ Medical Decision Making/ A&P   {   Click here for ABCD2, HEART and other calculatorsREFRESH Note before signing :1}                          Medical Decision Making Amount and/or Complexity of Data Reviewed Labs: ordered. Radiology: ordered.   ***  {Document critical care time when appropriate:1} {Document review of labs and clinical decision tools ie heart score, Chads2Vasc2 etc:1}  {Document your independent review of radiology images, and any outside records:1} {Document your discussion with family members, caretakers, and with consultants:1} {Document social determinants of health affecting pt's care:1} {Document your decision making why or why not admission, treatments were needed:1} Final Clinical Impression(s) / ED Diagnoses Final diagnoses:  None    Rx / DC Orders ED Discharge Orders     None

## 2023-01-05 NOTE — ED Provider Notes (Signed)
   ED Course / MDM   Clinical Course as of 01/05/23 2113  Wed Jan 05, 2023  2016 Discussed with Dr. Margaretann Loveless with cardiology, with recent stress test unlikely patient would receive further inpatient ischemic workup.  He is currently chest pain-free.  His troponin is negative x 2.  His EKG is no acute ischemic changes.  He does have a known history of stable angina and is prescribed nitroglycerin.  He is currently out of nitroglycerin.  Will represcribe nitroglycerin.  Discussed staying in the hospital for observation versus close outpatient follow-up.  With shared decision making patient prefers to go home and understands that he should return if his chest pain is worsening, occurs at rest, or fails to improve with his anti-angial therapy. Placed referral to expedite follow up with his cardiologist [name]. Will discharge patient to home. All questions answered. Patient comfortable with plan of discharge. Return precautions discussed with patient and specified on the after visit summary.  [WS]    Clinical Course User Index [WS] Cristie Hem, MD   Medical Decision Making Problems Addressed: Stable angina: chronic illness or injury with exacerbation, progression, or side effects of treatment  Amount and/or Complexity of Data Reviewed Labs: ordered. Radiology: ordered and independent interpretation performed. ECG/medicine tests: independent interpretation performed.  Risk Prescription drug management. Decision regarding hospitalization.          Cristie Hem, MD 01/05/23 2113

## 2023-01-05 NOTE — Discharge Instructions (Addendum)
We evaluated you for your chest pain.  Your cardiac enzymes were negative.  Your EKG did not show any signs of a heart attack.  We discussed staying in the hospital for observation or going home and following up closely with [name].  You preferred to go home and follow-up with [name].  We have refilled your nitroglycerin.  Please take this if you develop recurrent chest pain.  We have placed a referral for an expedited follow-up with [name]. Please call him tomorrow as well to help schedule your appointment.  If you develop any severe or worsening chest pain, your chest pain does not improve with rest or nitroglycerin, or you develop chest pain while you are sitting down and not active, you should return to the emergency department immediately.  You should also return to the emergency department if you develop any other new symptoms such as difficulty breathing, vomiting, lightheadedness, fainting, or any other concerning symptoms.

## 2023-01-05 NOTE — ED Triage Notes (Signed)
Pt BIB [facility] from home d/t CP & SOB that are both exertional in nature when he moves around they get worse. Endorses nausea & radiation of the CP to his Lt neck/shoulder & arm. States this began at 1900 last night & only took one Nitro last night then again this morning with a small amt of help in discomfort. EMS reports 12 L was NSR with Rt BBB, A/Ox4, 126/58, 60 bpm, 18 resp, 100% RA, 18g Lt AC.

## 2023-01-06 ENCOUNTER — Other Ambulatory Visit (HOSPITAL_BASED_OUTPATIENT_CLINIC_OR_DEPARTMENT_OTHER): Payer: Self-pay

## 2023-01-06 ENCOUNTER — Other Ambulatory Visit: Payer: Self-pay | Admitting: Internal Medicine

## 2023-01-06 DIAGNOSIS — E1165 Type 2 diabetes mellitus with hyperglycemia: Secondary | ICD-10-CM

## 2023-01-07 ENCOUNTER — Other Ambulatory Visit (HOSPITAL_BASED_OUTPATIENT_CLINIC_OR_DEPARTMENT_OTHER): Payer: Self-pay

## 2023-01-07 ENCOUNTER — Encounter (HOSPITAL_BASED_OUTPATIENT_CLINIC_OR_DEPARTMENT_OTHER): Payer: Self-pay

## 2023-01-10 ENCOUNTER — Institutional Professional Consult (permissible substitution): Payer: Medicare Other | Admitting: Primary Care

## 2023-01-11 ENCOUNTER — Ambulatory Visit: Payer: Self-pay | Admitting: Behavioral Health

## 2023-01-12 ENCOUNTER — Ambulatory Visit: Payer: Self-pay | Admitting: Behavioral Health

## 2023-01-14 ENCOUNTER — Telehealth: Payer: Self-pay

## 2023-01-14 ENCOUNTER — Other Ambulatory Visit: Payer: Self-pay | Admitting: Internal Medicine

## 2023-01-14 ENCOUNTER — Other Ambulatory Visit (HOSPITAL_BASED_OUTPATIENT_CLINIC_OR_DEPARTMENT_OTHER): Payer: Self-pay

## 2023-01-14 DIAGNOSIS — E1165 Type 2 diabetes mellitus with hyperglycemia: Secondary | ICD-10-CM

## 2023-01-14 MED ORDER — FREESTYLE LIBRE 2 SENSOR MISC
1.0000 | 0 refills | Status: DC
  Filled 2023-01-14: qty 6, 84d supply, fill #0

## 2023-01-14 NOTE — Telephone Encounter (Signed)
Contact patient to schedule appointment

## 2023-01-17 NOTE — Telephone Encounter (Signed)
Message left on daughter's cell phone for patient to call office to schedule follow up appointment.

## 2023-01-19 ENCOUNTER — Institutional Professional Consult (permissible substitution): Payer: Medicare Other | Admitting: Primary Care

## 2023-01-19 ENCOUNTER — Other Ambulatory Visit (HOSPITAL_BASED_OUTPATIENT_CLINIC_OR_DEPARTMENT_OTHER): Payer: Self-pay

## 2023-01-19 DIAGNOSIS — S63502A Unspecified sprain of left wrist, initial encounter: Secondary | ICD-10-CM | POA: Diagnosis not present

## 2023-01-20 ENCOUNTER — Ambulatory Visit: Payer: Medicare Other | Admitting: Internal Medicine

## 2023-01-20 NOTE — Progress Notes (Deleted)
Name: [name] [name]  Age/ Sex: 77 y.o., male   MRN/ DOB: [id number], 21-Aug-1946     PCP: [name], MD   Reason for Endocrinology Evaluation: Type 2 Diabetes Mellitus  Initial Endocrine Consultative Visit: 11/21/2017    PATIENT IDENTIFIER: [name] [name] is a 77 y.o. male with a past medical history of T2DM, HTN, CAD,Hypothyroidism CHF and OSA. The patient has followed with Endocrinology clinic since 11/21/2017 for consultative assistance with management of his diabetes.   On his initia visit with me 04/2022 he was on NPH only with an A1c 8.7 % . He declined SGLT-2i and opted for prandial insulin . We restarted toujeo and stopped NPH  DIABETIC HISTORY:  [name] [name] was diagnosed with DM in 1989.He was on Metformin at some point.  His hemoglobin A1c has ranged from 6.4% in 2022, peaking at 8.7% in 2023.  He was followed by Dr. Loanne Drilling from 2018 until 03/2022.  On his initial visit with me his A1c was 8.7%, he was on NPH once daily.  I switched him to multiple daily injections    SUBJECTIVE:   During the last visit (03/22/2022): A1c 8.7%  Today (01/20/2023): [name] [name] is here for follow-up on diabetes management. He is accompanied by his daughter today  He checks his blood sugars multiple  times daily through CGM. The patient has had hypoglycemic episodes since the last clinic visit, but he did not recall this .   Patient presented to the ED 01/05/2023 for chest pain, troponins negative x 2, with no EKG symptoms, was prescribed nitroglycerin for history of stable angina He was seen by his cardiologist 09/14/2022 Denies nausea, vomiting or diarrhea  He has tingling and  numbness of feet daily   HOME DIABETES REGIMEN:  Toujeo 65 units daily  Humalog 12 units TID QAC Correction Factor : HUmalog (BG-130/25)      Statin: yes ACE-I/ARB: no      CONTINUOUS GLUCOSE MONITORING RECORD INTERPRETATION   Unable to download  71- Hi       DIABETIC  COMPLICATIONS: Microvascular complications:  Neuropathy, CKD III Denies:  Last Eye Exam: Completed   Macrovascular complications:  CAD, PVD Denies: CVA   HISTORY:  Past Medical History:  Past Medical History:  Diagnosis Date   Acquired atrophy of thyroid 06/05/2013   Overview:  July 2014: controlled on synthroid 23mg since 2012 May 2015: decreased to Synthroid 50 mcg  Last Assessment & Plan:  Pt doing well with most recent TSH within normal range.  Will continue current dosage of Synthroid 563m. Pt reminded to take medication on empty stomach. Will continue to monitor with periodic laboratory assessment in 3 months.    Anemia    hemoglobin 7.4, iron deficiency, January, 2011, 2 unit transfusion, endoscopy normal, capsule endoscopy February, 2011 no small bowel abnormalities.   Most likely source gastric erosions, followed by GI   Anxiety    Bradycardia    CAD (coronary artery disease)    A. CABG in 2000,status post cardiac cath in 2006, 2009 ....continued chest pain and SOB despite oral medication adjestments including Ranexa. B. Cath November 2009/ mRCA - 2.75 x 23 Abbott Xience V drug-eluting stent ...11/26/2008 to distal  RCA leading to acute marginal.  C. Cath 07/2012 for CP - stable anatomy, med rx. d. cath 2015 and 05/30/2015 stable anatomy, consider Myoview if has CP again   Carotid artery disease ([place]08/26/2016   Doppler, May 29, 2015, 1-39% bilateral ICA  Cerebral ischemia    MRI November, 2010, chronic microvascular ischemia   CKD (chronic kidney disease), stage III ([place])    Concussion    Depression    Bipolar   Diabetes mellitus ([place])    Edema    Essential hypertension 06/05/2013   Overview:  July 2014: Controlled with Bystolic Sep 123456: Changed to atenolol.  Last Assessment & Plan:  Will change to atenolol for cost and follow Improved.  Medication compliance strongly encouraged BP: 116/64 mmHg    Overview:  July 2014: Controlled with Bystolic Sep 123456: Changed to  atenolol.  Last Assessment & Plan:  Will change to atenolol for cost and follow   Falling episodes    these have occurred in the past and again recurring 2011   Family history of adverse reaction to anesthesia    "mother died during bypass surgery but not sure if it has to do with anesthesia"   Gastric ulcer    GERD (gastroesophageal reflux disease)    H/O medication noncompliance    Due to loss of insurance   H/O multiple concussions    Hard of hearing    Heart murmur    History of blood transfusion 12/20/2013   History of kidney stones    Hx of CABG    2000,  / one median sternotomy suture broken her chest x-ray November, 2010, no clinical significance   Hyperlipidemia    Hypertension    pt. denies   Iron deficiency anemia    Long term (current) use of anticoagulants [Z79.01] 07/20/2016   Low back pain 06/12/2009   Qualifier: Diagnosis of  By: [name]    Nephrolithiasis    Orthostasis    OSA (obstructive sleep apnea)    PAF (paroxysmal atrial fibrillation) ([place])    a. dx 2017, started on amiodarone/warfarin but patient intermittently noncompliant.   PSVT (paroxysmal supraventricular tachycardia)    RBBB 07/09/2009   Qualifier: Diagnosis of  By: [name], MD, [facility], [name]    RLS (restless legs syndrome) 09/19/2009   Qualifier: Diagnosis of  By: [name]   Overview:  July 2014: Controlled with Mirapex  Last Assessment & Plan:  Patient is doing well. Will continue current management and follow clinically.   Seizure disorder ([place]) 01/09/2017   Spondylosis    C5-6, C6-7 MRI 2010   Syncope 03/2016   TBI (traumatic brain injury) ([place]) 2019   Thyroid disease    Tubulovillous adenoma of colon 2007   Vitamin D deficiency 05/17/2017   Wears glasses    Past Surgical History:  Past Surgical History:  Procedure Laterality Date   ANTERIOR CERVICAL DECOMP/DISCECTOMY FUSION N/A 05/31/2016   Procedure: ANTERIOR CERVICAL DECOMPRESSION/DISCECTOMY FUSION CERVICAL  FIVE-SIX,CERVICAL SIX-SEVEN;  Surgeon: [name], MD;  Location: [facility];  Service: Neurosurgery;  Laterality: N/A;   CARDIAC CATHETERIZATION N/A 05/30/2015   Procedure: Left Heart Cath and Coronary Angiography;  Surgeon: [name], MD;  Location: [facility];  Service: Cardiovascular;  Laterality: N/A;   CATARACT EXTRACTION     COLONOSCOPY     CORONARY ARTERY BYPASS GRAFT     2000   ESOPHAGOGASTRODUODENOSCOPY     LEFT HEART CATH AND CORS/GRAFTS ANGIOGRAPHY N/A 12/15/2017   Procedure: LEFT HEART CATH AND CORS/GRAFTS ANGIOGRAPHY;  Surgeon: [name], MD;  Location: [facility];  Service: Cardiovascular;  Laterality: N/A;   LEFT HEART CATHETERIZATION WITH CORONARY/GRAFT ANGIOGRAM N/A 08/01/2012   Procedure: LEFT HEART CATHETERIZATION WITH  Beatrix Fetters;  Surgeon: [name], MD;  Location: [facility] CATH LAB;  Service: Cardiovascular;  Laterality: N/A;   LEFT HEART CATHETERIZATION WITH CORONARY/GRAFT ANGIOGRAM N/A 01/03/2015   Procedure: LEFT HEART CATHETERIZATION WITH Beatrix Fetters;  Surgeon: [name], MD;  Location: [facility] CATH LAB;  Service: Cardiovascular;  Laterality: N/A;   NASAL SEPTUM SURGERY     UP3   PERCUTANEOUS CORONARY STENT INTERVENTION (PCI-S)  10/06/2008   mRCA PCI  2.75 x 23 Abbott Xience V drug-eluting stent    RIGHT/LEFT HEART CATH AND CORONARY/GRAFT ANGIOGRAPHY N/A 01/07/2020   Procedure: RIGHT/LEFT HEART CATH AND CORONARY/GRAFT ANGIOGRAPHY;  Surgeon: [name], MD;  Location: [facility];  Service: Cardiovascular;  Laterality: N/A;   TOTAL KNEE ARTHROPLASTY Right 09/22/2021   Procedure: RIGHT TOTAL KNEE ARTHROPLASTY;  Surgeon: [name], MD;  Location: [facility];  Service: Orthopedics;  Laterality: Right;   ULTRASOUND GUIDANCE FOR VASCULAR ACCESS  12/15/2017   Procedure: Ultrasound Guidance For Vascular Access;  Surgeon: [name], MD;  Location: [facility];  Service:  Cardiovascular;;   Social History:  reports that he has never smoked. He has never been exposed to tobacco smoke. He has never used smokeless tobacco. He reports that he does not drink alcohol and does not use drugs. Family History:  Family History  Problem Relation Age of Onset   Pancreatic cancer Brother    Diabetes Brother    Coronary artery disease Brother    Stroke Brother    Diabetes Brother    Diabetes Mother    Heart failure Mother    Heart failure Father    Hypothyroidism Brother    Coronary artery disease Brother    Other Brother        colon surgery   Heart attack Other        Nephew   Irregular heart beat Daughter    Cancer Maternal Grandmother        unknown    Colon cancer Neg Hx    Stomach cancer Neg Hx    Liver cancer Neg Hx    Rectal cancer Neg Hx    Esophageal cancer Neg Hx      HOME MEDICATIONS: Allergies as of 01/20/2023       Reactions   Morphine Other (See Comments)   Hallucinations   Shellfish-derived Products Itching        Medication List        Accurate as of January 20, 2023  7:23 AM. If you have any questions, ask your nurse or doctor.          amiodarone 200 MG tablet Commonly known as: PACERONE Take 1 tablet (200 mg total) by mouth daily.   amitriptyline 10 MG tablet Commonly known as: ELAVIL Take 1-3 tablets (10-30 mg total) by mouth at bedtime.   amoxicillin 500 MG capsule Commonly known as: AMOXIL Take 4 capsules (2,000 mg total) by mouth 1 hour before dental procedure.   aspirin EC 81 MG tablet Take 81 mg by mouth daily. Swallow whole.   atorvastatin 80 MG tablet Commonly known as: LIPITOR Take 1 tablet (80 mg total) by mouth daily.   Besivance 0.6 % Susp Generic drug: Besifloxacin HCl Instill 1 drop into right eye three times a day as directed. Please store upside down!!   Besivance 0.6 % Susp Generic drug: Besifloxacin HCl Instill 1 drop into left eye three times a day as directed   Comirnaty  syringe Generic drug: COVID-19  mRNA vaccine 2023-2024 Inject into the muscle.   divalproex 500 MG DR tablet Commonly known as: Depakote Take 1 tablet (500 mg total) by mouth 2 (two) times daily.   donepezil 10 MG tablet Commonly known as: ARICEPT Take 1 tablet (10 mg total) by mouth at bedtime.   Eliquis 5 MG Tabs tablet Generic drug: apixaban Take 1 tablet (5 mg total) by mouth 2 (two) times daily.   fenofibrate 160 MG tablet Take 1 tablet (160 mg total) by mouth daily.   Fluad Quadrivalent 0.5 ML injection Generic drug: influenza vaccine adjuvanted Inject into the muscle.   FreeStyle Libre 2 Sensor Misc Use as instructed to check blood sugar, change every 14 days *No further refills until appt is scheduled*   glucose blood test strip Commonly known as: Accu-Chek Aviva Use as directed to check blood sugar 4 times daily.  DX E11.9   HumaLOG KwikPen 100 UNIT/ML KwikPen Generic drug: insulin lispro Inject 10 Units into the skin 3 (three) times daily.   hydrocortisone 2.5 % cream Apply on to the skin 2 (two) times daily.   hydrOXYzine 10 MG tablet Commonly known as: ATARAX Take 1 tablet (10 mg total) by mouth 3 (three) times daily as needed for anxiety or itching.   isosorbide mononitrate 120 MG 24 hr tablet Commonly known as: IMDUR Take 1 tablet (120 mg total) by mouth daily.   ketorolac 0.5 % ophthalmic solution Commonly known as: ACULAR Place 1 drop into the right eye 4 times a day as directed   ketorolac 0.5 % ophthalmic solution Commonly known as: ACULAR Place 1 drop into the left eye 4 (four) times daily.   levocetirizine 5 MG tablet Commonly known as: XYZAL Take 1 tablet (5 mg total) by mouth every evening.   levothyroxine 100 MCG tablet Commonly known as: SYNTHROID TAKE 1 TABLET (100 MCG TOTAL) BY MOUTH DAILY.   meclizine 25 MG tablet Commonly known as: ANTIVERT Take 1 tablet (25 mg total) by mouth 3 (three) times daily as needed for dizziness.    nitroGLYCERIN 0.4 MG SL tablet Commonly known as: NITROSTAT PLACE 1 TABLET UNDER THE TONGUE EVERY 5 MINUTES AS NEEDED FOR CHEST PAIN.   pantoprazole 40 MG tablet Commonly known as: PROTONIX Take 1 tablet (40 mg total) by mouth daily.   Pennsaid 2 % Soln Generic drug: diclofenac Sodium Place 1 application onto the skin 2 (two) times daily.   Pentips 31G X 5 MM Misc Generic drug: Insulin Pen Needle Use as directed to inject insulin in the morning, at noon, in the evening, and at bedtime.   pramipexole 1 MG tablet Commonly known as: Mirapex Take 1/2 to 1 tablet (0.5-1 mg total) by mouth at bedtime.   prednisoLONE acetate 1 % ophthalmic suspension Commonly known as: PRED FORTE Place 1 drop into the right eye 4 times a day as directed. Please shake bottle well before each use.   prednisoLONE acetate 1 % ophthalmic suspension Commonly known as: PRED FORTE Place 1 drop into the left eye 4 (four) times daily.   ranolazine 500 MG 12 hr tablet Commonly known as: RANEXA TAKE 1 TABLET BY MOUTH TWICE DAILY   sertraline 100 MG tablet Commonly known as: ZOLOFT Take 1 tablet (100 mg total) by mouth 2 (two) times daily.   Toujeo Max SoloStar 300 UNIT/ML Solostar Pen Generic drug: insulin glargine (2 Unit Dial) Inject 65 Units into the skin daily in the afternoon.   triamcinolone cream 0.1 % Commonly known as: KENALOG  Apply 1 application topically 2 (two) times daily. Use as needed for itchy rash         OBJECTIVE:   Vital Signs: There were no vitals taken for this visit.  Wt Readings from Last 3 Encounters:  12/21/22 176 lb 9.6 oz (80.1 kg)  09/21/22 179 lb (81.2 kg)  09/14/22 178 lb (80.7 kg)     Exam: General: Pt appears well and is in NAD  Lungs: Clear with good BS bilat with no rales, rhonchi, or wheezes  Heart: RRR with normal S1 and S2 and no gallops; no murmurs; no rub  Abdomen: Normoactive bowel sounds, soft, nontender, without masses or organomegaly palpable   Extremities: Trace  pretibial edema.   Neuro: MS is good with appropriate affect, pt is alert and Ox3       DATA REVIEWED:  Lab Results  Component Value Date   HGBA1C 8.6 (H) 11/12/2022   HGBA1C 9.4 (A) 06/01/2022   HGBA1C 8.7 (A) 03/22/2022    Latest Reference Range & Units 02/14/22 00:04  Sodium 135 - 145 mmol/L 140  Potassium 3.5 - 5.1 mmol/L 3.8  Chloride 98 - 111 mmol/L 109  CO2 22 - 32 mmol/L 25  Glucose 70 - 99 mg/dL 46 (L)  BUN 8 - 23 mg/dL 14  Creatinine 0.61 - 1.24 mg/dL 1.55 (H)  Calcium 8.9 - 10.3 mg/dL 9.5  Anion gap 5 - 15  6  GFR, Estimated >60 mL/min 47 (L)     ASSESSMENT / PLAN / RECOMMENDATIONS:   1) Type 2 Diabetes Mellitus, poorly controlled, With neuropathic, CKD III and macrovascular complications - Most recent A1c of 9.4 %. Goal A1c <7.0%.      -Patient has been noted with hyperglycemia through the day. -Discussed pharmacokinetics of basal/bolus insulin and the importance of taking prandial insulin with meals.  We also discussed avoiding sugar-sweetened beverages and snacks, when possible.   -We had discussed SGLT-2 inhibitors , discussed cardiovascular and renal benefits as well as risk of genital infection. He opted for prandial insulin as he is not comfortable taking SGLT-2 I at the time of his visit in May 2023 -We also discussed the importance of separating Humalog dosing to prevent insulin stacking that would cause hypoglycemia -I will increase his prandial dose of insulin and encouraged him to continue to use the correction scale  MEDICATIONS:  Continue Toujeo 65 units ONCE daily  Increase Humalog 12 units TIDQAC Correction Factor : HUmalog (BG -130/25)   EDUCATION / INSTRUCTIONS: BG monitoring instructions: Patient is instructed to check his blood sugars 3 times a day, before meals . Call [facility] if: BG persistently < 70  I reviewed the Rule of 15 for the treatment of hypoglycemia in detail with the patient.  Literature supplied.   2) Diabetic complications:  Eye: Does not have known diabetic retinopathy.  Neuro/ Feet: Does  have known diabetic peripheral neuropathy .  Renal: Patient does  have known baseline CKD. He   is not on an ACEI/ARB at present.    3) Peripheral Neuropathy:  -We discussed gabapentin as a treatment option, we also discussed side effects to include lower extremity edema, and drowsiness -We have opted to proceed with topical treatment such as capsaicin cream  F/U in 3 months     Signed electronically by: Mack Guise, MD  [facility]  [facility] [address] [place], [place] [address] Phone: [phone] FAX: [phone]   CC: [name],  MD [place]  [address] Phone: [phone]  Fax: [phone]  Return to Endocrinology clinic as below: Future Appointments  Date Time Provider [place]  01/20/2023  9:50 AM Tonie Vizcarrondo, Melanie Crazier, MD [facility] None  01/27/2023  1:45 PM Alric Ran, MD [facility] None  02/15/2023  3:00 PM [name], MD [facility]  02/24/2023  9:00 AM [name], MD [facility]

## 2023-01-27 ENCOUNTER — Encounter: Payer: Self-pay | Admitting: Neurology

## 2023-01-27 ENCOUNTER — Other Ambulatory Visit (HOSPITAL_BASED_OUTPATIENT_CLINIC_OR_DEPARTMENT_OTHER): Payer: Self-pay

## 2023-01-27 ENCOUNTER — Telehealth: Payer: Self-pay | Admitting: Neurology

## 2023-01-27 ENCOUNTER — Ambulatory Visit (INDEPENDENT_AMBULATORY_CARE_PROVIDER_SITE_OTHER): Payer: Medicare Other | Admitting: Neurology

## 2023-01-27 VITALS — BP 176/76 | HR 61 | Ht 68.0 in | Wt 176.0 lb

## 2023-01-27 DIAGNOSIS — F03A18 Unspecified dementia, mild, with other behavioral disturbance: Secondary | ICD-10-CM

## 2023-01-27 DIAGNOSIS — W19XXXD Unspecified fall, subsequent encounter: Secondary | ICD-10-CM

## 2023-01-27 DIAGNOSIS — R2689 Other abnormalities of gait and mobility: Secondary | ICD-10-CM

## 2023-01-27 DIAGNOSIS — R2681 Unsteadiness on feet: Secondary | ICD-10-CM | POA: Diagnosis not present

## 2023-01-27 DIAGNOSIS — G3184 Mild cognitive impairment, so stated: Secondary | ICD-10-CM

## 2023-01-27 MED ORDER — QUETIAPINE FUMARATE 25 MG PO TABS
25.0000 mg | ORAL_TABLET | Freq: Every day | ORAL | 6 refills | Status: DC
  Filled 2023-01-27 – 2023-02-23 (×2): qty 30, 30d supply, fill #0
  Filled 2023-03-30: qty 30, 30d supply, fill #1
  Filled 2023-04-30: qty 30, 30d supply, fill #2
  Filled 2023-05-30: qty 30, 30d supply, fill #3
  Filled 2023-06-27: qty 30, 30d supply, fill #4
  Filled 2023-07-27: qty 30, 30d supply, fill #5
  Filled 2023-08-26: qty 30, 30d supply, fill #6

## 2023-01-27 MED ORDER — MEMANTINE HCL 10 MG PO TABS
10.0000 mg | ORAL_TABLET | Freq: Two times a day (BID) | ORAL | 11 refills | Status: DC
  Filled 2023-01-27 – 2023-02-23 (×2): qty 60, 30d supply, fill #0
  Filled 2023-04-07: qty 60, 30d supply, fill #1
  Filled 2023-05-02: qty 60, 30d supply, fill #2
  Filled 2023-05-30: qty 60, 30d supply, fill #3
  Filled 2023-06-27: qty 60, 30d supply, fill #4
  Filled 2023-07-27: qty 60, 30d supply, fill #5
  Filled 2023-08-26: qty 60, 30d supply, fill #6
  Filled 2023-10-03: qty 60, 30d supply, fill #7
  Filled 2023-10-31: qty 60, 30d supply, fill #8
  Filled 2023-12-05: qty 60, 30d supply, fill #9
  Filled 2023-12-28 – 2024-01-05 (×4): qty 60, 30d supply, fill #10
  Filled 2024-01-18 – 2024-01-31 (×2): qty 60, 30d supply, fill #11

## 2023-01-27 NOTE — Progress Notes (Signed)
[facility]  PATIENT: [name] [name] DOB: [birth year]  REQUESTING CLINICIAN: [name], MD HISTORY FROM: Patient and daughter REASON FOR VISIT: Worsening memory    HISTORICAL  CHIEF COMPLAINT:  Chief Complaint  Patient presents with   Follow-up    Rm 12, State his memory has worsen    INTERVAL HISTORY 01/27/2023:  Patient presents today for follow-up, last visit was a year ago in March.  He is accompanied today by daughter.  Since last visit daughter reports that his memory got worse.  He cannot remember nothing.  Patient is aware also that his memory is worse.  He reports that he can go to the kitchen and does not know why he came to the kitchen or what he needs to do.  On top of that, daughter reports that he had multiple falls, and also have sundowning effect.  He might get agitated at night.   Memory is getting worse, can't remember nothing  Will go to the kitchen and not know what he wants to do  Sundowning  Multiple falls  Continue to decline     HISTORY OF PRESENT ILLNESS:  Patient presents today for follow-up, he is accompanied by his daughter.  Last visit was in August 2022.  Since then daughter has reported that patient memory got worse.  He does repeat himself, asking the same questions over and over, daughter reports trouble with short-term memory, sometimes gets frustrated with his memory.  He is still having hallucinations, visual only, and now he is getting paranoid. At time, he will call daughter by the wife's name who is deceased in 08-17-2021.  He still able to cook, clean, pays his bills, he does not drive, does have word finding difficulty and sometimes calling daughter by the wrong name.  Other than that he remains active, does a lot in the house.  He has occasional dizziness for which he will take meclizine.  Daughter has reported that the dizziness mostly happens after patient exerts himself or stand all morning cooking and  cleaning.  Denies any falls recently.     PREVIOUS HISTORY FROM DR. WILLIS 07/07/2021:  [name] [name] is a 77 year old right-handed white male with a history of mild memory changes that date back several years. The patient was seen and evaluated by Dr. [name] in 2016 for some cognitive changes felt associated with prior concussions. The patient underwent neuropsychological evaluation in 2018, the results suggest that the patient did not have an organic dementia at that time.  The patient has had some history of seizure type events, he was on Depakote for a number of years, but sometime since 2020, he went off the Depakote.  The patient now reports that he is having headaches with some regularity, at least 1 or 2 headaches a week.  The patient has not had any recent seizures.  He does have a history of diabetes, his most recent hemoglobin A1c was 7.2.  He reports that he chronically has insomnia, he will go to bed around 10:30 PM or 11 PM and wakes up around 330 or 4 AM and cannot get back to sleep.  He has drowsiness during the day, he will oftentimes take naps.  He has chronic fatigue.  He reports some short-term memory, he cannot remember recent events. He still drives a car but occasionally he will get lost.  He is able to keep up with his medications and appointments, he does some of the finances but his wife does  most of them.  He does report some gait instability, he has significant right knee arthritis and will be having knee surgery for total knee replacement next week.  He denies any significant numbness of the feet.  He denies any focal weakness.  A recent CT scan of the brain does show some evidence of cerebrovascular disease including a left pontine stroke event.  He comes to this office for further evaluation.   OTHER MEDICAL CONDITIONS: Atrial fibrillation on Eliquis, CAD sp quadruple bypass, Diabetes, Hypothyroidism, anxiety depression   REVIEW OF SYSTEMS: Full 14 system review of systems  performed and negative with exception of: as  noted in the HPI,  ALLERGIES: Allergies  Allergen Reactions   Morphine Other (See Comments)    Hallucinations   Shellfish-Derived Products Itching    HOME MEDICATIONS: Outpatient Medications Prior to Visit  Medication Sig Dispense Refill   amiodarone (PACERONE) 200 MG tablet Take 1 tablet (200 mg total) by mouth daily. 90 tablet 3   amitriptyline (ELAVIL) 10 MG tablet Take 1-3 tablets (10-30 mg total) by mouth at bedtime. 90 tablet 1   amoxicillin (AMOXIL) 500 MG capsule Take 4 capsules (2,000 mg total) by mouth 1 hour before dental procedure. 4 capsule 0   apixaban (ELIQUIS) 5 MG TABS tablet Take 1 tablet (5 mg total) by mouth 2 (two) times daily. 60 tablet 5   aspirin EC 81 MG tablet Take 81 mg by mouth daily. Swallow whole.     atorvastatin (LIPITOR) 80 MG tablet Take 1 tablet (80 mg total) by mouth daily. 90 tablet 1   BESIVANCE 0.6 % SUSP Instill 1 drop into right eye three times a day as directed. Please store upside down!! 5 mL 1   BESIVANCE 0.6 % SUSP Instill 1 drop into left eye three times a day as directed 5 mL 1   Continuous Blood Gluc Sensor (FREESTYLE LIBRE 2 SENSOR) MISC Use as instructed to check blood sugar, change every 14 days *No further refills until appt is scheduled* 6 each 0   COVID-19 mRNA vaccine 2023-2024 (COMIRNATY) syringe Inject into the muscle. 0.3 mL 0   Diclofenac Sodium (PENNSAID) 2 % SOLN Place 1 application onto the skin 2 (two) times daily. 112 g 2   divalproex (DEPAKOTE) 500 MG DR tablet Take 1 tablet (500 mg total) by mouth 2 (two) times daily. 60 tablet 5   fenofibrate 160 MG tablet Take 1 tablet (160 mg total) by mouth daily. 30 tablet 0   glucose blood (ACCU-CHEK AVIVA) test strip Use as directed to check blood sugar 4 times daily.  DX E11.9 200 each 6   hydrocortisone 2.5 % cream Apply on to the skin 2 (two) times daily. 30 g 1   hydrOXYzine (ATARAX) 10 MG tablet Take 1 tablet (10 mg total) by mouth 3  (three) times daily as needed for anxiety or itching. 30 tablet 1   influenza vaccine adjuvanted (FLUAD QUADRIVALENT) 0.5 ML injection Inject into the muscle. 0.5 mL 0   insulin glargine, 2 Unit Dial, (TOUJEO MAX SOLOSTAR) 300 UNIT/ML Solostar Pen Inject 65 Units into the skin daily in the afternoon. 30 mL 2   insulin lispro (HUMALOG KWIKPEN) 100 UNIT/ML KwikPen Inject 10 Units into the skin 3 (three) times daily. 30 mL 3   Insulin Pen Needle 31G X 5 MM MISC Use as directed to inject insulin in the morning, at noon, in the evening, and at bedtime. 400 each 3   isosorbide mononitrate (IMDUR) 120 MG  24 hr tablet Take 1 tablet (120 mg total) by mouth daily. 90 tablet 3   ketorolac (ACULAR) 0.5 % ophthalmic solution Place 1 drop into the right eye 4 times a day as directed 5 mL 1   ketorolac (ACULAR) 0.5 % ophthalmic solution Place 1 drop into the left eye 4 (four) times daily. 5 mL 1   levocetirizine (XYZAL) 5 MG tablet Take 1 tablet (5 mg total) by mouth every evening. 30 tablet 2   levothyroxine (SYNTHROID) 100 MCG tablet TAKE 1 TABLET (100 MCG TOTAL) BY MOUTH DAILY. 90 tablet 1   meclizine (ANTIVERT) 25 MG tablet Take 1 tablet (25 mg total) by mouth 3 (three) times daily as needed for dizziness. 30 tablet 0   nitroGLYCERIN (NITROSTAT) 0.4 MG SL tablet PLACE 1 TABLET UNDER THE TONGUE EVERY 5 MINUTES AS NEEDED FOR CHEST PAIN. 25 tablet 1   pantoprazole (PROTONIX) 40 MG tablet Take 1 tablet (40 mg total) by mouth daily. 90 tablet 1   pramipexole (MIRAPEX) 1 MG tablet Take 1/2 to 1 tablet (0.5-1 mg total) by mouth at bedtime. 90 tablet 0   prednisoLONE acetate (PRED FORTE) 1 % ophthalmic suspension Place 1 drop into the right eye 4 times a day as directed. Please shake bottle well before each use. 5 mL 1   prednisoLONE acetate (PRED FORTE) 1 % ophthalmic suspension Place 1 drop into the left eye 4 (four) times daily. 5 mL 1   sertraline (ZOLOFT) 100 MG tablet Take 1 tablet (100 mg total) by mouth 2 (two)  times daily. 180 tablet 0   triamcinolone cream (KENALOG) 0.1 % Apply 1 application topically 2 (two) times daily. Use as needed for itchy rash 30 g 1   donepezil (ARICEPT) 10 MG tablet Take 1 tablet (10 mg total) by mouth at bedtime. 90 tablet 4   ranolazine (RANEXA) 500 MG 12 hr tablet TAKE 1 TABLET BY MOUTH TWICE DAILY 60 tablet 5   No facility-administered medications prior to visit.    PAST MEDICAL HISTORY: Past Medical History:  Diagnosis Date   Acquired atrophy of thyroid 06/05/2013   Overview:  July 2014: controlled on synthroid 30mg since 2012 May 2015: decreased to Synthroid 50 mcg  Last Assessment & Plan:  Pt doing well with most recent TSH within normal range.  Will continue current dosage of Synthroid 512m. Pt reminded to take medication on empty stomach. Will continue to monitor with periodic laboratory assessment in 3 months.    Anemia    hemoglobin 7.4, iron deficiency, January, 2011, 2 unit transfusion, endoscopy normal, capsule endoscopy February, 2011 no small bowel abnormalities.   Most likely source gastric erosions, followed by GI   Anxiety    Bradycardia    CAD (coronary artery disease)    A. CABG in 2000,status post cardiac cath in 2006, 2009 ....continued chest pain and SOB despite oral medication adjestments including Ranexa. B. Cath November 2009/ mRCA - 2.75 x 23 Abbott Xience V drug-eluting stent ...11/26/2008 to distal  RCA leading to acute marginal.  C. Cath 07/2012 for CP - stable anatomy, med rx. d. cath 2015 and 05/30/2015 stable anatomy, consider Myoview if has CP again   [place]   Doppler, May 29, 2015, 1-39% bilateral ICA    Cerebral ischemia    MRI November, 2010, chronic microvascular ischemia   CKD (chronic kidney disease), stage III ([facility]   Concussion    Depression    Bipolar   Diabetes mellitus (  [place])    Edema    Essential hypertension 06/05/2013   Overview:  July 2014: Controlled with Bystolic Sep 123456: Changed  to atenolol.  Last Assessment & Plan:  Will change to atenolol for cost and follow Improved.  Medication compliance strongly encouraged BP: 116/64 mmHg    Overview:  July 2014: Controlled with Bystolic Sep 123456: Changed to atenolol.  Last Assessment & Plan:  Will change to atenolol for cost and follow   Falling episodes    these have occurred in the past and again recurring 2011   Family history of adverse reaction to anesthesia    "mother died during bypass surgery but not sure if it has to do with anesthesia"   Gastric ulcer    GERD (gastroesophageal reflux disease)    H/O medication noncompliance    Due to loss of insurance   H/O multiple concussions    Hard of hearing    Heart murmur    History of blood transfusion 12/20/2013   History of kidney stones    Hx of CABG    2000,  / one median sternotomy suture broken her chest x-ray November, 2010, no clinical significance   Hyperlipidemia    Hypertension    pt. denies   Iron deficiency anemia    Long term (current) use of anticoagulants [Z79.01] 07/20/2016   Low back pain 06/12/2009   Qualifier: Diagnosis of  By: [name]    Nephrolithiasis    Orthostasis    OSA (obstructive sleep apnea)    PAF (paroxysmal atrial fibrillation) ([place])    a. dx 2017, started on amiodarone/warfarin but patient intermittently noncompliant.   PSVT (paroxysmal supraventricular tachycardia)    RBBB 07/09/2009   Qualifier: Diagnosis of  By: [name], MD, [facility], [name]    RLS (restless legs syndrome) 09/19/2009   Qualifier: Diagnosis of  By: [name]   Overview:  July 2014: Controlled with Mirapex  Last Assessment & Plan:  Patient is doing well. Will continue current management and follow clinically.   Seizure disorder ([place]) 01/09/2017   Spondylosis    C5-6, C6-7 MRI 2010   Syncope 03/2016   TBI (traumatic brain injury) ([place]) 2019   Thyroid disease    Tubulovillous adenoma of colon 2007   Vitamin D deficiency 05/17/2017   Wears  glasses     PAST SURGICAL HISTORY: Past Surgical History:  Procedure Laterality Date   ANTERIOR CERVICAL DECOMP/DISCECTOMY FUSION N/A 05/31/2016   Procedure: ANTERIOR CERVICAL DECOMPRESSION/DISCECTOMY FUSION CERVICAL FIVE-SIX,CERVICAL SIX-SEVEN;  Surgeon: [name], MD;  Location: [place] [facility];  Service: Neurosurgery;  Laterality: N/A;   CARDIAC CATHETERIZATION N/A 05/30/2015   Procedure: Left Heart Cath and Coronary Angiography;  Surgeon: [name], MD;  Location: [place] [facility];  Service: Cardiovascular;  Laterality: N/A;   CATARACT EXTRACTION     COLONOSCOPY     CORONARY ARTERY BYPASS GRAFT     2000   ESOPHAGOGASTRODUODENOSCOPY     LEFT HEART CATH AND CORS/GRAFTS ANGIOGRAPHY N/A 12/15/2017   Procedure: LEFT HEART CATH AND CORS/GRAFTS ANGIOGRAPHY;  Surgeon: [name], MD;  Location: [place] [facility];  Service: Cardiovascular;  Laterality: N/A;   LEFT HEART CATHETERIZATION WITH CORONARY/GRAFT ANGIOGRAM N/A 08/01/2012   Procedure: LEFT HEART CATHETERIZATION WITH Beatrix Fetters;  Surgeon: [name], MD;  Location: [facility] CATH LAB;  Service: Cardiovascular;  Laterality: N/A;   LEFT HEART CATHETERIZATION WITH CORONARY/GRAFT ANGIOGRAM N/A 01/03/2015   Procedure: LEFT HEART CATHETERIZATION WITH  Beatrix Fetters;  Surgeon: [name], MD;  Location: [facility] C[facility] LAB;  Service: Cardiovascular;  Laterality: N/A;   NASAL SEPTUM SURGERY     UP3   PERCUTANEOUS CORONARY STENT INTERVENTION (PCI-S)  10/06/2008   mRCA PCI  2.75 x 23 Abbott Xience V drug-eluting stent    RIGHT/LEFT HEART CATH AND CORONARY/GRAFT ANGIOGRAPHY N/A 01/07/2020   Procedure: RIGHT/LEFT HEART CATH AND CORONARY/GRAFT ANGIOGRAPHY;  Surgeon: [name], MD;  Location: [place] [facility] LAB;  Service: Cardiovascular;  Laterality: N/A;   TOTAL KNEE ARTHROPLASTY Right 09/22/2021   Procedure: RIGHT TOTAL KNEE ARTHROPLASTY;  Surgeon: [name], MD;  Location: [facility];  Service:  Orthopedics;  Laterality: Right;   ULTRASOUND GUIDANCE FOR VASCULAR ACCESS  12/15/2017   Procedure: Ultrasound Guidance For Vascular Access;  Surgeon: [name], MD;  Location: [place] [facility] LAB;  Service: Cardiovascular;;    FAMILY HISTORY: Family History  Problem Relation Age of Onset   Pancreatic cancer Brother    Diabetes Brother    Coronary artery disease Brother    Stroke Brother    Diabetes Brother    Diabetes Mother    Heart failure Mother    Heart failure Father    Hypothyroidism Brother    Coronary artery disease Brother    Other Brother        colon surgery   Heart attack Other        Nephew   Irregular heart beat Daughter    Cancer Maternal Grandmother        unknown    Colon cancer Neg Hx    Stomach cancer Neg Hx    Liver cancer Neg Hx    Rectal cancer Neg Hx    Esophageal cancer Neg Hx     SOCIAL HISTORY: Social History   Socioeconomic History   Marital status: Married    Spouse name: Not on file   Number of children: 4   Years of education: 13   Highest education level: Not on file  Occupational History   Occupation: retired  Tobacco Use   Smoking status: Never    Passive exposure: Never   Smokeless tobacco: Never  Vaping Use   Vaping Use: Never used  Substance and Sexual Activity   Alcohol use: No    Alcohol/week: 0.0 standard drinks of alcohol    Comment: stopped drinking in 1998   Drug use: No   Sexual activity: Not Currently    Partners: Female  Other Topics Concern   Not on file  Social History Narrative   Patient is right handed.   Patient does not drink caffeine.   Social Determinants of Health   Financial Resource Strain: Medium Risk (08/02/2022)   Overall Financial Resource Strain (CARDIA)    Difficulty of Paying Living Expenses: Somewhat hard  Food Insecurity: No Food Insecurity (08/02/2022)   Hunger Vital Sign    Worried About Running Out of Food in the Last Year: Never true    Ran Out of Food in the Last Year:  Never true  Transportation Needs: No Transportation Needs (08/02/2022)   PRAPARE - Hydrologist (Medical): No    Lack of Transportation (Non-Medical): No  Physical Activity: Insufficiently Active (08/02/2022)   Exercise Vital Sign    Days of Exercise per Week: 7 days    Minutes of Exercise per Session: 20 min  Stress: Stress Concern Present (08/02/2022)   [place]  Feeling of Stress : To some extent  Social Connections: Moderately Isolated (08/02/2022)   Social Connection and Isolation Panel [NHANES]    Frequency of Communication with Friends and Family: More than three times a week    Frequency of Social Gatherings with Friends and Family: More than three times a week    Attends Religious Services: More than 4 times per year    Active Member of [employer] or Organizations: No    Attends Archivist Meetings: Never    Marital Status: Widowed  Intimate Partner Violence: Not At Risk (08/02/2022)   Humiliation, Afraid, Rape, and Kick questionnaire    Fear of Current or Ex-Partner: No    Emotionally Abused: No    Physically Abused: No    Sexually Abused: No    PHYSICAL EXAM  GENERAL EXAM/CONSTITUTIONAL: Vitals:  Vitals:   01/27/23 1403  BP: (!) 176/76  Pulse: 61  Weight: 176 lb (79.8 kg)  Height: '5\' 8"'$  (1.727 m)    Body mass index is 26.76 kg/m. Wt Readings from Last 3 Encounters:  01/27/23 176 lb (79.8 kg)  12/21/22 176 lb 9.6 oz (80.1 kg)  09/21/22 179 lb (81.2 kg)   Patient is in no distress; well developed, nourished and groomed; neck is supple  EYES: Visual fields full to confrontation, Extraocular movements intacts,   MUSCULOSKELETAL: Gait, strength, tone, movements noted in Neurologic exam below  NEUROLOGIC: MENTAL STATUS:     12/21/2022   10:41 AM 07/07/2021   10:16 AM 04/21/2017   11:00 AM  MMSE - Mini Mental State Exam  Not completed:   Unable to complete   Orientation to time 4 4   Orientation to Place 4 4   Registration 3 3   Attention/ Calculation 0 1   Recall 3 3   Language- name 2 objects 2 2   Language- repeat 1 0   Language- follow 3 step command 3 3   Language- read & follow direction 1 1   Write a sentence 1 1   Copy design 1 0   Total score 23 22    awake, alert, oriented to person, place and time Was only able to recall the last 2 [place] presidents.   CRANIAL NERVE:  2nd, 3rd, 4th, 6th - visual fields full to confrontation, extraocular muscles intact, no nystagmus 5th - facial sensation symmetric 7th - facial strength symmetric 8th - hearing intact 9th - palate elevates symmetrically, uvula midline 11th - shoulder shrug symmetric 12th - tongue protrusion midline  MOTOR:  normal bulk and tone, full strength in the BUE, BLE  SENSORY:  normal and symmetric to light touch  COORDINATION:  finger-nose-finger, fine finger movements normal  REFLEXES:  deep tendon reflexes present and symmetric  GAIT/STATION:   Slow, wide base. He does have a positive romberg.      DIAGNOSTIC DATA (LABS, IMAGING, TESTING) - I reviewed patient records, labs, notes, testing and imaging myself where available.  Lab Results  Component Value Date   WBC 9.9 01/05/2023   HGB 13.7 01/05/2023   HCT 40.3 01/05/2023   MCV 90.2 01/05/2023   PLT 170 01/05/2023      Component Value Date/Time   NA 137 01/05/2023 1643   NA 134 10/23/2020 1054   K 4.1 01/05/2023 1643   CL 104 01/05/2023 1643   CO2 25 01/05/2023 1643   GLUCOSE 129 (H) 01/05/2023 1643   BUN 26 (H) 01/05/2023 1643   BUN 22 10/23/2020 1054   CREATININE 1.60 (  H) 01/05/2023 1643   CREATININE 2.02 (H) 09/15/2020 0855   CALCIUM 8.9 01/05/2023 1643   PROT 6.7 11/12/2022 0843   PROT 6.8 09/27/2017 0823   ALBUMIN 4.3 11/12/2022 0843   ALBUMIN 4.4 09/27/2017 0823   AST 27 11/12/2022 0843   ALT 31 11/12/2022 0843   ALKPHOS 95 11/12/2022 0843   BILITOT 0.6 11/12/2022 0843    BILITOT 0.6 09/27/2017 0823   GFRNONAA 45 (L) 01/05/2023 1643   GFRAA 38 (L) 10/23/2020 1054   Lab Results  Component Value Date   CHOL 95 11/12/2022   HDL 41.00 11/12/2022   LDLCALC 37 11/12/2022   LDLDIRECT 77.0 08/20/2019   TRIG 81.0 11/12/2022   CHOLHDL 2 11/12/2022   Lab Results  Component Value Date   HGBA1C 8.6 (H) 11/12/2022   Lab Results  Component Value Date   VITAMINB12 646 07/07/2021   Lab Results  Component Value Date   TSH 13.42 (H) 11/12/2022    Head CT 02/12/2022 1. No evidence of acute intracranial abnormality. 2. Similar atrophy and chronic microvascular ischemic disease.    ASSESSMENT AND PLAN  77 y.o. year old male with multiple medical conditions including atrial fibrillation, coronary artery disease, diabetes, hypertension, hyperlipidemia who is presenting for follow-up for his memory problem.  They report that his memory is getting worse.  He is becoming more forgetful and having sundowning effect.  He is already on Aricept 10 mg nightly, will add Namenda 10 mg twice daily.  I will also get ATN profile to look for Alzheimer biomarker and refer him to home health services for PT.  I will contact the patient to go over the results, otherwise I will see him in 1 year or sooner if worse.  I asked them to contact me if his agitation get worse or if he is having new hallucinations.  They voiced understanding.    1. MCI (mild cognitive impairment)   2. Mild dementia with other behavioral disturbance, unspecified dementia type ([place])   3. Balance problem   4. Fall, subsequent encounter   5. Gait instability      Patient Instructions  Continue with Aricept 10 mg nightly  Start Memantine 10 mg twice daily  Seroquel 25 mg at bedtime for sundowning and sleep aid  ATN profile to look for Alzheimer biomarker  Referral to home health services  Follow up in 1 year or sooner if worse    There are well-accepted and sensible ways to reduce risk for Alzheimers  disease and other degenerative brain disorders .  Exercise Daily Walk A daily 20 minute walk should be part of your routine. Disease related apathy can be a significant roadblock to exercise and the only way to overcome this is to make it a daily routine and perhaps have a reward at the end (something your loved one loves to eat or drink perhaps) or a personal trainer coming to the home can also be very useful. Most importantly, the patient is much more likely to exercise if the caregiver / spouse does it with him/her. In general a structured, repetitive schedule is best.  General Health: Any diseases which effect your body will effect your brain such as a pneumonia, urinary infection, blood clot, heart attack or stroke. Keep contact with your primary care doctor for regular follow ups.  Sleep. A good nights sleep is healthy for the brain. Seven hours is recommended. If you have insomnia or poor sleep habits we can give you some instructions. If you  have sleep apnea wear your mask.  Diet: Eating a heart healthy diet is also a good idea; fish and poultry instead of red meat, nuts (mostly non-peanuts), vegetables, fruits, olive oil or canola oil (instead of butter), minimal salt (use other spices to flavor foods), whole grain rice, bread, cereal and pasta and wine in moderation.Research is now showing that the MIND diet, which is a combination of The Mediterranean diet and the DASH diet, is beneficial for cognitive processing and longevity. Information about this diet can be found in The MIND Diet, a book by Doyne Keel, MS, RDN, and online at NotebookDistributors.si  Finances, Power of Attorney and Advance Directives: You should consider putting legal safeguards in place with regard to financial and medical decision making. While the spouse always has power of attorney for medical and financial issues in the absence of any form, you should consider what you want in case the spouse /  caregiver is no longer around or capable of making decisions.   Orders Placed This Encounter  Procedures   ATN PROFILE   Ambulatory referral to [facility] ordered this encounter  Medications   memantine (NAMENDA) 10 MG tablet    Sig: Take 1 tablet (10 mg total) by mouth 2 (two) times daily.    Dispense:  60 tablet    Refill:  11   QUEtiapine (SEROQUEL) 25 MG tablet    Sig: Take 1 tablet (25 mg total) by mouth at bedtime.    Dispense:  30 tablet    Refill:  6    Return in about 1 year (around 01/28/2024).  I have spent a total of 45 minutes dedicated to this patient today, preparing to see patient, performing a medically appropriate examination and evaluation, ordering tests and/or medications and procedures, and counseling and educating the patient/family/caregiver; independently interpreting result and communicating results to the family/patient/caregiver; and documenting clinical information in the electronic medical record.   Alric Ran, MD 01/30/2023, 1:01 PM  [facility] [address], [place], [place] [address] [phone]

## 2023-01-27 NOTE — Telephone Encounter (Signed)
[facility] has accepted pt, they will reach out to him to set up.

## 2023-01-28 ENCOUNTER — Other Ambulatory Visit (HOSPITAL_BASED_OUTPATIENT_CLINIC_OR_DEPARTMENT_OTHER): Payer: Self-pay

## 2023-01-29 LAB — ATN PROFILE
A -- Beta-amyloid 42/40 Ratio: 0.095 — ABNORMAL LOW (ref >0.102)
Beta-amyloid 40: 221.09 pg/mL
Beta-amyloid 42: 21.05 pg/mL
N -- NfL, Plasma: 5.34 pg/mL (ref 0.00–7.64)
T -- p-tau181: 3.35 pg/mL — ABNORMAL HIGH (ref 0.00–0.97)

## 2023-01-30 NOTE — Patient Instructions (Addendum)
Continue with Aricept 10 mg nightly  Start Memantine 10 mg twice daily  Seroquel 25 mg at bedtime for sundowning and sleep aid  ATN profile to look for Alzheimer biomarker  Referral to home health services  Follow up in 1 year or sooner if worse    There are well-accepted and sensible ways to reduce risk for Alzheimers disease and other degenerative brain disorders .  Exercise Daily Walk A daily 20 minute walk should be part of your routine. Disease related apathy can be a significant roadblock to exercise and the only way to overcome this is to make it a daily routine and perhaps have a reward at the end (something your loved one loves to eat or drink perhaps) or a personal trainer coming to the home can also be very useful. Most importantly, the patient is much more likely to exercise if the caregiver / spouse does it with him/her. In general a structured, repetitive schedule is best.  General Health: Any diseases which effect your body will effect your brain such as a pneumonia, urinary infection, blood clot, heart attack or stroke. Keep contact with your primary care doctor for regular follow ups.  Sleep. A good nights sleep is healthy for the brain. Seven hours is recommended. If you have insomnia or poor sleep habits we can give you some instructions. If you have sleep apnea wear your mask.  Diet: Eating a heart healthy diet is also a good idea; fish and poultry instead of red meat, nuts (mostly non-peanuts), vegetables, fruits, olive oil or canola oil (instead of butter), minimal salt (use other spices to flavor foods), whole grain rice, bread, cereal and pasta and wine in moderation.Research is now showing that the MIND diet, which is a combination of The Mediterranean diet and the DASH diet, is beneficial for cognitive processing and longevity. Information about this diet can be found in The MIND Diet, a book by Doyne Keel, MS, RDN, and online at  [employer]book[employer].si  Finances, Power of Attorney and Advance Directives: You should consider putting legal safeguards in place with regard to financial and medical decision making. While the spouse always has power of attorney for medical and financial issues in the absence of any form, you should consider what you want in case the spouse / caregiver is no longer around or capable of making decisions.

## 2023-02-02 ENCOUNTER — Encounter: Payer: Self-pay | Admitting: Neurology

## 2023-02-03 DIAGNOSIS — E1122 Type 2 diabetes mellitus with diabetic chronic kidney disease: Secondary | ICD-10-CM | POA: Diagnosis not present

## 2023-02-03 DIAGNOSIS — E559 Vitamin D deficiency, unspecified: Secondary | ICD-10-CM | POA: Diagnosis not present

## 2023-02-03 DIAGNOSIS — E785 Hyperlipidemia, unspecified: Secondary | ICD-10-CM | POA: Diagnosis not present

## 2023-02-03 DIAGNOSIS — Z7901 Long term (current) use of anticoagulants: Secondary | ICD-10-CM | POA: Diagnosis not present

## 2023-02-03 DIAGNOSIS — H9190 Unspecified hearing loss, unspecified ear: Secondary | ICD-10-CM | POA: Diagnosis not present

## 2023-02-03 DIAGNOSIS — Z7982 Long term (current) use of aspirin: Secondary | ICD-10-CM | POA: Diagnosis not present

## 2023-02-03 DIAGNOSIS — I129 Hypertensive chronic kidney disease with stage 1 through stage 4 chronic kidney disease, or unspecified chronic kidney disease: Secondary | ICD-10-CM | POA: Diagnosis not present

## 2023-02-03 DIAGNOSIS — G4733 Obstructive sleep apnea (adult) (pediatric): Secondary | ICD-10-CM | POA: Diagnosis not present

## 2023-02-03 DIAGNOSIS — D631 Anemia in chronic kidney disease: Secondary | ICD-10-CM | POA: Diagnosis not present

## 2023-02-03 DIAGNOSIS — M47812 Spondylosis without myelopathy or radiculopathy, cervical region: Secondary | ICD-10-CM | POA: Diagnosis not present

## 2023-02-03 DIAGNOSIS — I4510 Unspecified right bundle-branch block: Secondary | ICD-10-CM | POA: Diagnosis not present

## 2023-02-03 DIAGNOSIS — F0284 Dementia in other diseases classified elsewhere, unspecified severity, with anxiety: Secondary | ICD-10-CM | POA: Diagnosis not present

## 2023-02-03 DIAGNOSIS — I480 Paroxysmal atrial fibrillation: Secondary | ICD-10-CM | POA: Diagnosis not present

## 2023-02-03 DIAGNOSIS — K219 Gastro-esophageal reflux disease without esophagitis: Secondary | ICD-10-CM | POA: Diagnosis not present

## 2023-02-03 DIAGNOSIS — F32A Depression, unspecified: Secondary | ICD-10-CM | POA: Diagnosis not present

## 2023-02-03 DIAGNOSIS — I2510 Atherosclerotic heart disease of native coronary artery without angina pectoris: Secondary | ICD-10-CM | POA: Diagnosis not present

## 2023-02-03 DIAGNOSIS — Z794 Long term (current) use of insulin: Secondary | ICD-10-CM | POA: Diagnosis not present

## 2023-02-03 DIAGNOSIS — I471 Supraventricular tachycardia, unspecified: Secondary | ICD-10-CM | POA: Diagnosis not present

## 2023-02-03 DIAGNOSIS — G2581 Restless legs syndrome: Secondary | ICD-10-CM | POA: Diagnosis not present

## 2023-02-03 DIAGNOSIS — D509 Iron deficiency anemia, unspecified: Secondary | ICD-10-CM | POA: Diagnosis not present

## 2023-02-03 DIAGNOSIS — E039 Hypothyroidism, unspecified: Secondary | ICD-10-CM | POA: Diagnosis not present

## 2023-02-03 DIAGNOSIS — N183 Chronic kidney disease, stage 3 unspecified: Secondary | ICD-10-CM | POA: Diagnosis not present

## 2023-02-03 DIAGNOSIS — F0283 Dementia in other diseases classified elsewhere, unspecified severity, with mood disturbance: Secondary | ICD-10-CM | POA: Diagnosis not present

## 2023-02-03 NOTE — Telephone Encounter (Signed)
Yes, please have them schedule a follow up or add them to the cancellation list. Thanks

## 2023-02-04 ENCOUNTER — Telehealth: Payer: Self-pay | Admitting: Neurology

## 2023-02-04 NOTE — Telephone Encounter (Signed)
[facility] ([facility]) request verbal orders for skills nursing. Frequency 1x wk for 4 wks                    1x wk every other wk for 4 wks                    1 prn

## 2023-02-07 ENCOUNTER — Other Ambulatory Visit (HOSPITAL_BASED_OUTPATIENT_CLINIC_OR_DEPARTMENT_OTHER): Payer: Self-pay

## 2023-02-07 NOTE — Telephone Encounter (Signed)
Called and verbalized orders with [facility]

## 2023-02-09 DIAGNOSIS — D509 Iron deficiency anemia, unspecified: Secondary | ICD-10-CM | POA: Diagnosis not present

## 2023-02-09 DIAGNOSIS — I480 Paroxysmal atrial fibrillation: Secondary | ICD-10-CM | POA: Diagnosis not present

## 2023-02-09 DIAGNOSIS — D631 Anemia in chronic kidney disease: Secondary | ICD-10-CM | POA: Diagnosis not present

## 2023-02-09 DIAGNOSIS — M47812 Spondylosis without myelopathy or radiculopathy, cervical region: Secondary | ICD-10-CM | POA: Diagnosis not present

## 2023-02-09 DIAGNOSIS — K219 Gastro-esophageal reflux disease without esophagitis: Secondary | ICD-10-CM | POA: Diagnosis not present

## 2023-02-09 DIAGNOSIS — Z794 Long term (current) use of insulin: Secondary | ICD-10-CM | POA: Diagnosis not present

## 2023-02-09 DIAGNOSIS — E039 Hypothyroidism, unspecified: Secondary | ICD-10-CM | POA: Diagnosis not present

## 2023-02-09 DIAGNOSIS — G4733 Obstructive sleep apnea (adult) (pediatric): Secondary | ICD-10-CM | POA: Diagnosis not present

## 2023-02-09 DIAGNOSIS — Z7901 Long term (current) use of anticoagulants: Secondary | ICD-10-CM | POA: Diagnosis not present

## 2023-02-09 DIAGNOSIS — F32A Depression, unspecified: Secondary | ICD-10-CM | POA: Diagnosis not present

## 2023-02-09 DIAGNOSIS — F0284 Dementia in other diseases classified elsewhere, unspecified severity, with anxiety: Secondary | ICD-10-CM | POA: Diagnosis not present

## 2023-02-09 DIAGNOSIS — I4510 Unspecified right bundle-branch block: Secondary | ICD-10-CM | POA: Diagnosis not present

## 2023-02-09 DIAGNOSIS — H9190 Unspecified hearing loss, unspecified ear: Secondary | ICD-10-CM | POA: Diagnosis not present

## 2023-02-09 DIAGNOSIS — I2510 Atherosclerotic heart disease of native coronary artery without angina pectoris: Secondary | ICD-10-CM | POA: Diagnosis not present

## 2023-02-09 DIAGNOSIS — E785 Hyperlipidemia, unspecified: Secondary | ICD-10-CM | POA: Diagnosis not present

## 2023-02-09 DIAGNOSIS — E1122 Type 2 diabetes mellitus with diabetic chronic kidney disease: Secondary | ICD-10-CM | POA: Diagnosis not present

## 2023-02-09 DIAGNOSIS — E559 Vitamin D deficiency, unspecified: Secondary | ICD-10-CM | POA: Diagnosis not present

## 2023-02-09 DIAGNOSIS — F0283 Dementia in other diseases classified elsewhere, unspecified severity, with mood disturbance: Secondary | ICD-10-CM | POA: Diagnosis not present

## 2023-02-09 DIAGNOSIS — I471 Supraventricular tachycardia, unspecified: Secondary | ICD-10-CM | POA: Diagnosis not present

## 2023-02-09 DIAGNOSIS — N183 Chronic kidney disease, stage 3 unspecified: Secondary | ICD-10-CM | POA: Diagnosis not present

## 2023-02-09 DIAGNOSIS — I129 Hypertensive chronic kidney disease with stage 1 through stage 4 chronic kidney disease, or unspecified chronic kidney disease: Secondary | ICD-10-CM | POA: Diagnosis not present

## 2023-02-09 DIAGNOSIS — G2581 Restless legs syndrome: Secondary | ICD-10-CM | POA: Diagnosis not present

## 2023-02-09 DIAGNOSIS — Z7982 Long term (current) use of aspirin: Secondary | ICD-10-CM | POA: Diagnosis not present

## 2023-02-10 ENCOUNTER — Telehealth: Payer: Self-pay | Admitting: Neurology

## 2023-02-10 DIAGNOSIS — E1122 Type 2 diabetes mellitus with diabetic chronic kidney disease: Secondary | ICD-10-CM | POA: Diagnosis not present

## 2023-02-10 DIAGNOSIS — I129 Hypertensive chronic kidney disease with stage 1 through stage 4 chronic kidney disease, or unspecified chronic kidney disease: Secondary | ICD-10-CM | POA: Diagnosis not present

## 2023-02-10 DIAGNOSIS — K219 Gastro-esophageal reflux disease without esophagitis: Secondary | ICD-10-CM | POA: Diagnosis not present

## 2023-02-10 DIAGNOSIS — E039 Hypothyroidism, unspecified: Secondary | ICD-10-CM | POA: Diagnosis not present

## 2023-02-10 DIAGNOSIS — F0283 Dementia in other diseases classified elsewhere, unspecified severity, with mood disturbance: Secondary | ICD-10-CM | POA: Diagnosis not present

## 2023-02-10 DIAGNOSIS — I2510 Atherosclerotic heart disease of native coronary artery without angina pectoris: Secondary | ICD-10-CM | POA: Diagnosis not present

## 2023-02-10 DIAGNOSIS — Z7982 Long term (current) use of aspirin: Secondary | ICD-10-CM | POA: Diagnosis not present

## 2023-02-10 DIAGNOSIS — M47812 Spondylosis without myelopathy or radiculopathy, cervical region: Secondary | ICD-10-CM | POA: Diagnosis not present

## 2023-02-10 DIAGNOSIS — Z794 Long term (current) use of insulin: Secondary | ICD-10-CM | POA: Diagnosis not present

## 2023-02-10 DIAGNOSIS — I471 Supraventricular tachycardia, unspecified: Secondary | ICD-10-CM | POA: Diagnosis not present

## 2023-02-10 DIAGNOSIS — F32A Depression, unspecified: Secondary | ICD-10-CM | POA: Diagnosis not present

## 2023-02-10 DIAGNOSIS — N183 Chronic kidney disease, stage 3 unspecified: Secondary | ICD-10-CM | POA: Diagnosis not present

## 2023-02-10 DIAGNOSIS — I4510 Unspecified right bundle-branch block: Secondary | ICD-10-CM | POA: Diagnosis not present

## 2023-02-10 DIAGNOSIS — D631 Anemia in chronic kidney disease: Secondary | ICD-10-CM | POA: Diagnosis not present

## 2023-02-10 DIAGNOSIS — G2581 Restless legs syndrome: Secondary | ICD-10-CM | POA: Diagnosis not present

## 2023-02-10 DIAGNOSIS — E785 Hyperlipidemia, unspecified: Secondary | ICD-10-CM | POA: Diagnosis not present

## 2023-02-10 DIAGNOSIS — Z7901 Long term (current) use of anticoagulants: Secondary | ICD-10-CM | POA: Diagnosis not present

## 2023-02-10 DIAGNOSIS — D509 Iron deficiency anemia, unspecified: Secondary | ICD-10-CM | POA: Diagnosis not present

## 2023-02-10 DIAGNOSIS — F0284 Dementia in other diseases classified elsewhere, unspecified severity, with anxiety: Secondary | ICD-10-CM | POA: Diagnosis not present

## 2023-02-10 DIAGNOSIS — E559 Vitamin D deficiency, unspecified: Secondary | ICD-10-CM | POA: Diagnosis not present

## 2023-02-10 DIAGNOSIS — I480 Paroxysmal atrial fibrillation: Secondary | ICD-10-CM | POA: Diagnosis not present

## 2023-02-10 DIAGNOSIS — H9190 Unspecified hearing loss, unspecified ear: Secondary | ICD-10-CM | POA: Diagnosis not present

## 2023-02-10 DIAGNOSIS — G4733 Obstructive sleep apnea (adult) (pediatric): Secondary | ICD-10-CM | POA: Diagnosis not present

## 2023-02-10 NOTE — Telephone Encounter (Signed)
Michelle from [facility] called requesting VO for 1x8 weeks for PT to work on Engineer, materials, Insurance underwriter and Walking.

## 2023-02-10 NOTE — Telephone Encounter (Signed)
I called Sharyn Lull back @ [facility] and LVM with verbal orders for PT for 1x8 weeks.

## 2023-02-10 NOTE — Telephone Encounter (Signed)
Thanks

## 2023-02-11 DIAGNOSIS — H9190 Unspecified hearing loss, unspecified ear: Secondary | ICD-10-CM | POA: Diagnosis not present

## 2023-02-11 DIAGNOSIS — Z7982 Long term (current) use of aspirin: Secondary | ICD-10-CM | POA: Diagnosis not present

## 2023-02-11 DIAGNOSIS — M47812 Spondylosis without myelopathy or radiculopathy, cervical region: Secondary | ICD-10-CM | POA: Diagnosis not present

## 2023-02-11 DIAGNOSIS — I129 Hypertensive chronic kidney disease with stage 1 through stage 4 chronic kidney disease, or unspecified chronic kidney disease: Secondary | ICD-10-CM | POA: Diagnosis not present

## 2023-02-11 DIAGNOSIS — F32A Depression, unspecified: Secondary | ICD-10-CM | POA: Diagnosis not present

## 2023-02-11 DIAGNOSIS — E1122 Type 2 diabetes mellitus with diabetic chronic kidney disease: Secondary | ICD-10-CM | POA: Diagnosis not present

## 2023-02-11 DIAGNOSIS — Z794 Long term (current) use of insulin: Secondary | ICD-10-CM | POA: Diagnosis not present

## 2023-02-11 DIAGNOSIS — E559 Vitamin D deficiency, unspecified: Secondary | ICD-10-CM | POA: Diagnosis not present

## 2023-02-11 DIAGNOSIS — D631 Anemia in chronic kidney disease: Secondary | ICD-10-CM | POA: Diagnosis not present

## 2023-02-11 DIAGNOSIS — E039 Hypothyroidism, unspecified: Secondary | ICD-10-CM | POA: Diagnosis not present

## 2023-02-11 DIAGNOSIS — I4510 Unspecified right bundle-branch block: Secondary | ICD-10-CM | POA: Diagnosis not present

## 2023-02-11 DIAGNOSIS — I2510 Atherosclerotic heart disease of native coronary artery without angina pectoris: Secondary | ICD-10-CM | POA: Diagnosis not present

## 2023-02-11 DIAGNOSIS — F0283 Dementia in other diseases classified elsewhere, unspecified severity, with mood disturbance: Secondary | ICD-10-CM | POA: Diagnosis not present

## 2023-02-11 DIAGNOSIS — D509 Iron deficiency anemia, unspecified: Secondary | ICD-10-CM | POA: Diagnosis not present

## 2023-02-11 DIAGNOSIS — E785 Hyperlipidemia, unspecified: Secondary | ICD-10-CM | POA: Diagnosis not present

## 2023-02-11 DIAGNOSIS — N183 Chronic kidney disease, stage 3 unspecified: Secondary | ICD-10-CM | POA: Diagnosis not present

## 2023-02-11 DIAGNOSIS — G2581 Restless legs syndrome: Secondary | ICD-10-CM | POA: Diagnosis not present

## 2023-02-11 DIAGNOSIS — Z7901 Long term (current) use of anticoagulants: Secondary | ICD-10-CM | POA: Diagnosis not present

## 2023-02-11 DIAGNOSIS — I480 Paroxysmal atrial fibrillation: Secondary | ICD-10-CM | POA: Diagnosis not present

## 2023-02-11 DIAGNOSIS — I471 Supraventricular tachycardia, unspecified: Secondary | ICD-10-CM | POA: Diagnosis not present

## 2023-02-11 DIAGNOSIS — F0284 Dementia in other diseases classified elsewhere, unspecified severity, with anxiety: Secondary | ICD-10-CM | POA: Diagnosis not present

## 2023-02-11 DIAGNOSIS — K219 Gastro-esophageal reflux disease without esophagitis: Secondary | ICD-10-CM | POA: Diagnosis not present

## 2023-02-11 DIAGNOSIS — G4733 Obstructive sleep apnea (adult) (pediatric): Secondary | ICD-10-CM | POA: Diagnosis not present

## 2023-02-12 DIAGNOSIS — W19XXXA Unspecified fall, initial encounter: Secondary | ICD-10-CM | POA: Diagnosis not present

## 2023-02-12 DIAGNOSIS — S93691A Other sprain of right foot, initial encounter: Secondary | ICD-10-CM | POA: Diagnosis not present

## 2023-02-12 DIAGNOSIS — S93401A Sprain of unspecified ligament of right ankle, initial encounter: Secondary | ICD-10-CM | POA: Diagnosis not present

## 2023-02-14 ENCOUNTER — Other Ambulatory Visit (HOSPITAL_BASED_OUTPATIENT_CLINIC_OR_DEPARTMENT_OTHER): Payer: Self-pay

## 2023-02-14 ENCOUNTER — Other Ambulatory Visit: Payer: Self-pay

## 2023-02-14 MED ORDER — DICLOFENAC SODIUM 75 MG PO TBEC
75.0000 mg | DELAYED_RELEASE_TABLET | Freq: Two times a day (BID) | ORAL | 0 refills | Status: DC
  Filled 2023-02-14 – 2023-02-23 (×2): qty 20, 10d supply, fill #0
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
## 2023-02-15 ENCOUNTER — Ambulatory Visit: Payer: Medicare Other | Admitting: Interventional Cardiology

## 2023-02-15 ENCOUNTER — Other Ambulatory Visit (HOSPITAL_BASED_OUTPATIENT_CLINIC_OR_DEPARTMENT_OTHER): Payer: Self-pay

## 2023-02-15 DIAGNOSIS — Z7901 Long term (current) use of anticoagulants: Secondary | ICD-10-CM

## 2023-02-15 DIAGNOSIS — F32A Depression, unspecified: Secondary | ICD-10-CM | POA: Diagnosis not present

## 2023-02-15 DIAGNOSIS — Z7982 Long term (current) use of aspirin: Secondary | ICD-10-CM | POA: Diagnosis not present

## 2023-02-15 DIAGNOSIS — G2581 Restless legs syndrome: Secondary | ICD-10-CM | POA: Diagnosis not present

## 2023-02-15 DIAGNOSIS — I25700 Atherosclerosis of coronary artery bypass graft(s), unspecified, with unstable angina pectoris: Secondary | ICD-10-CM

## 2023-02-15 DIAGNOSIS — E039 Hypothyroidism, unspecified: Secondary | ICD-10-CM | POA: Diagnosis not present

## 2023-02-15 DIAGNOSIS — I480 Paroxysmal atrial fibrillation: Secondary | ICD-10-CM

## 2023-02-15 DIAGNOSIS — F0283 Dementia in other diseases classified elsewhere, unspecified severity, with mood disturbance: Secondary | ICD-10-CM | POA: Diagnosis not present

## 2023-02-15 DIAGNOSIS — Z794 Long term (current) use of insulin: Secondary | ICD-10-CM | POA: Diagnosis not present

## 2023-02-15 DIAGNOSIS — I2510 Atherosclerotic heart disease of native coronary artery without angina pectoris: Secondary | ICD-10-CM | POA: Diagnosis not present

## 2023-02-15 DIAGNOSIS — I471 Supraventricular tachycardia, unspecified: Secondary | ICD-10-CM | POA: Diagnosis not present

## 2023-02-15 DIAGNOSIS — G4733 Obstructive sleep apnea (adult) (pediatric): Secondary | ICD-10-CM | POA: Diagnosis not present

## 2023-02-15 DIAGNOSIS — E785 Hyperlipidemia, unspecified: Secondary | ICD-10-CM | POA: Diagnosis not present

## 2023-02-15 DIAGNOSIS — E1122 Type 2 diabetes mellitus with diabetic chronic kidney disease: Secondary | ICD-10-CM | POA: Diagnosis not present

## 2023-02-15 DIAGNOSIS — I129 Hypertensive chronic kidney disease with stage 1 through stage 4 chronic kidney disease, or unspecified chronic kidney disease: Secondary | ICD-10-CM | POA: Diagnosis not present

## 2023-02-15 DIAGNOSIS — I4510 Unspecified right bundle-branch block: Secondary | ICD-10-CM | POA: Diagnosis not present

## 2023-02-15 DIAGNOSIS — D631 Anemia in chronic kidney disease: Secondary | ICD-10-CM | POA: Diagnosis not present

## 2023-02-15 DIAGNOSIS — I4821 Permanent atrial fibrillation: Secondary | ICD-10-CM

## 2023-02-15 DIAGNOSIS — I10 Essential (primary) hypertension: Secondary | ICD-10-CM

## 2023-02-15 DIAGNOSIS — M47812 Spondylosis without myelopathy or radiculopathy, cervical region: Secondary | ICD-10-CM | POA: Diagnosis not present

## 2023-02-15 DIAGNOSIS — F0284 Dementia in other diseases classified elsewhere, unspecified severity, with anxiety: Secondary | ICD-10-CM | POA: Diagnosis not present

## 2023-02-15 DIAGNOSIS — D509 Iron deficiency anemia, unspecified: Secondary | ICD-10-CM | POA: Diagnosis not present

## 2023-02-15 DIAGNOSIS — H9190 Unspecified hearing loss, unspecified ear: Secondary | ICD-10-CM | POA: Diagnosis not present

## 2023-02-15 DIAGNOSIS — N189 Chronic kidney disease, unspecified: Secondary | ICD-10-CM

## 2023-02-15 DIAGNOSIS — E559 Vitamin D deficiency, unspecified: Secondary | ICD-10-CM | POA: Diagnosis not present

## 2023-02-15 DIAGNOSIS — K219 Gastro-esophageal reflux disease without esophagitis: Secondary | ICD-10-CM | POA: Diagnosis not present

## 2023-02-15 DIAGNOSIS — N183 Chronic kidney disease, stage 3 unspecified: Secondary | ICD-10-CM | POA: Diagnosis not present

## 2023-02-15 DIAGNOSIS — I5032 Chronic diastolic (congestive) heart failure: Secondary | ICD-10-CM

## 2023-02-17 DIAGNOSIS — E1122 Type 2 diabetes mellitus with diabetic chronic kidney disease: Secondary | ICD-10-CM | POA: Diagnosis not present

## 2023-02-17 DIAGNOSIS — Z794 Long term (current) use of insulin: Secondary | ICD-10-CM | POA: Diagnosis not present

## 2023-02-17 DIAGNOSIS — I480 Paroxysmal atrial fibrillation: Secondary | ICD-10-CM | POA: Diagnosis not present

## 2023-02-17 DIAGNOSIS — D631 Anemia in chronic kidney disease: Secondary | ICD-10-CM | POA: Diagnosis not present

## 2023-02-17 DIAGNOSIS — Z7982 Long term (current) use of aspirin: Secondary | ICD-10-CM | POA: Diagnosis not present

## 2023-02-17 DIAGNOSIS — F0283 Dementia in other diseases classified elsewhere, unspecified severity, with mood disturbance: Secondary | ICD-10-CM | POA: Diagnosis not present

## 2023-02-17 DIAGNOSIS — N183 Chronic kidney disease, stage 3 unspecified: Secondary | ICD-10-CM | POA: Diagnosis not present

## 2023-02-17 DIAGNOSIS — F0284 Dementia in other diseases classified elsewhere, unspecified severity, with anxiety: Secondary | ICD-10-CM | POA: Diagnosis not present

## 2023-02-17 DIAGNOSIS — D509 Iron deficiency anemia, unspecified: Secondary | ICD-10-CM | POA: Diagnosis not present

## 2023-02-17 DIAGNOSIS — M47812 Spondylosis without myelopathy or radiculopathy, cervical region: Secondary | ICD-10-CM | POA: Diagnosis not present

## 2023-02-17 DIAGNOSIS — H9190 Unspecified hearing loss, unspecified ear: Secondary | ICD-10-CM | POA: Diagnosis not present

## 2023-02-17 DIAGNOSIS — I2510 Atherosclerotic heart disease of native coronary artery without angina pectoris: Secondary | ICD-10-CM | POA: Diagnosis not present

## 2023-02-17 DIAGNOSIS — G2581 Restless legs syndrome: Secondary | ICD-10-CM | POA: Diagnosis not present

## 2023-02-17 DIAGNOSIS — I4510 Unspecified right bundle-branch block: Secondary | ICD-10-CM | POA: Diagnosis not present

## 2023-02-17 DIAGNOSIS — K219 Gastro-esophageal reflux disease without esophagitis: Secondary | ICD-10-CM | POA: Diagnosis not present

## 2023-02-17 DIAGNOSIS — I471 Supraventricular tachycardia, unspecified: Secondary | ICD-10-CM | POA: Diagnosis not present

## 2023-02-17 DIAGNOSIS — E785 Hyperlipidemia, unspecified: Secondary | ICD-10-CM | POA: Diagnosis not present

## 2023-02-17 DIAGNOSIS — E039 Hypothyroidism, unspecified: Secondary | ICD-10-CM | POA: Diagnosis not present

## 2023-02-17 DIAGNOSIS — F32A Depression, unspecified: Secondary | ICD-10-CM | POA: Diagnosis not present

## 2023-02-17 DIAGNOSIS — Z7901 Long term (current) use of anticoagulants: Secondary | ICD-10-CM | POA: Diagnosis not present

## 2023-02-17 DIAGNOSIS — G4733 Obstructive sleep apnea (adult) (pediatric): Secondary | ICD-10-CM | POA: Diagnosis not present

## 2023-02-17 DIAGNOSIS — E559 Vitamin D deficiency, unspecified: Secondary | ICD-10-CM | POA: Diagnosis not present

## 2023-02-17 DIAGNOSIS — I129 Hypertensive chronic kidney disease with stage 1 through stage 4 chronic kidney disease, or unspecified chronic kidney disease: Secondary | ICD-10-CM | POA: Diagnosis not present

## 2023-02-21 ENCOUNTER — Other Ambulatory Visit (HOSPITAL_BASED_OUTPATIENT_CLINIC_OR_DEPARTMENT_OTHER): Payer: Self-pay

## 2023-02-21 DIAGNOSIS — D509 Iron deficiency anemia, unspecified: Secondary | ICD-10-CM | POA: Diagnosis not present

## 2023-02-21 DIAGNOSIS — I129 Hypertensive chronic kidney disease with stage 1 through stage 4 chronic kidney disease, or unspecified chronic kidney disease: Secondary | ICD-10-CM | POA: Diagnosis not present

## 2023-02-21 DIAGNOSIS — H9190 Unspecified hearing loss, unspecified ear: Secondary | ICD-10-CM | POA: Diagnosis not present

## 2023-02-21 DIAGNOSIS — E1122 Type 2 diabetes mellitus with diabetic chronic kidney disease: Secondary | ICD-10-CM | POA: Diagnosis not present

## 2023-02-21 DIAGNOSIS — N183 Chronic kidney disease, stage 3 unspecified: Secondary | ICD-10-CM | POA: Diagnosis not present

## 2023-02-21 DIAGNOSIS — Z7901 Long term (current) use of anticoagulants: Secondary | ICD-10-CM | POA: Diagnosis not present

## 2023-02-21 DIAGNOSIS — Z7982 Long term (current) use of aspirin: Secondary | ICD-10-CM | POA: Diagnosis not present

## 2023-02-21 DIAGNOSIS — F0283 Dementia in other diseases classified elsewhere, unspecified severity, with mood disturbance: Secondary | ICD-10-CM | POA: Diagnosis not present

## 2023-02-21 DIAGNOSIS — E559 Vitamin D deficiency, unspecified: Secondary | ICD-10-CM | POA: Diagnosis not present

## 2023-02-21 DIAGNOSIS — F0284 Dementia in other diseases classified elsewhere, unspecified severity, with anxiety: Secondary | ICD-10-CM | POA: Diagnosis not present

## 2023-02-21 DIAGNOSIS — I480 Paroxysmal atrial fibrillation: Secondary | ICD-10-CM | POA: Diagnosis not present

## 2023-02-21 DIAGNOSIS — D631 Anemia in chronic kidney disease: Secondary | ICD-10-CM | POA: Diagnosis not present

## 2023-02-21 DIAGNOSIS — K219 Gastro-esophageal reflux disease without esophagitis: Secondary | ICD-10-CM | POA: Diagnosis not present

## 2023-02-21 DIAGNOSIS — F32A Depression, unspecified: Secondary | ICD-10-CM | POA: Diagnosis not present

## 2023-02-21 DIAGNOSIS — I2510 Atherosclerotic heart disease of native coronary artery without angina pectoris: Secondary | ICD-10-CM | POA: Diagnosis not present

## 2023-02-21 DIAGNOSIS — G2581 Restless legs syndrome: Secondary | ICD-10-CM | POA: Diagnosis not present

## 2023-02-21 DIAGNOSIS — G4733 Obstructive sleep apnea (adult) (pediatric): Secondary | ICD-10-CM | POA: Diagnosis not present

## 2023-02-21 DIAGNOSIS — M47812 Spondylosis without myelopathy or radiculopathy, cervical region: Secondary | ICD-10-CM | POA: Diagnosis not present

## 2023-02-21 DIAGNOSIS — I471 Supraventricular tachycardia, unspecified: Secondary | ICD-10-CM | POA: Diagnosis not present

## 2023-02-21 DIAGNOSIS — Z794 Long term (current) use of insulin: Secondary | ICD-10-CM | POA: Diagnosis not present

## 2023-02-21 DIAGNOSIS — E039 Hypothyroidism, unspecified: Secondary | ICD-10-CM | POA: Diagnosis not present

## 2023-02-21 DIAGNOSIS — E785 Hyperlipidemia, unspecified: Secondary | ICD-10-CM | POA: Diagnosis not present

## 2023-02-21 DIAGNOSIS — I4510 Unspecified right bundle-branch block: Secondary | ICD-10-CM | POA: Diagnosis not present

## 2023-02-22 NOTE — Progress Notes (Deleted)
Office Visit    Patient Name: [name] Date of Encounter: 02/22/2023  Primary Care Provider:  [name], MD Primary Cardiologist:  Larae Grooms, MD Primary Electrophysiologist: None  Chief Complaint    [name] [name] is a 77 y.o. male with PMH of CAD s/p CABG in 2000 with repeat cath in 2006 and 2009 with DES to distal RCA, DM type II, HTN, HLD RBBB, OSA, spondylosis, HFpEF, PAF (on Eliquis) who presents today for 88-month follow-up of coronary artery disease and AF.  Past Medical History    Past Medical History:  Diagnosis Date   Acquired atrophy of thyroid 06/05/2013   Overview:  July 2014: controlled on synthroid 71mcg since 2012 May 2015: decreased to Synthroid 50 mcg  Last Assessment & Plan:  Pt doing well with most recent TSH within normal range.  Will continue current dosage of Synthroid 77mcg. Pt reminded to take medication on empty stomach. Will continue to monitor with periodic laboratory assessment in 3 months.    Anemia    hemoglobin 7.4, iron deficiency, January, 2011, 2 unit transfusion, endoscopy normal, capsule endoscopy February, 2011 no small bowel abnormalities.   Most likely source gastric erosions, followed by GI   Anxiety    Bradycardia    CAD (coronary artery disease)    A. CABG in 2000,status post cardiac cath in 2006, 2009 ....continued chest pain and SOB despite oral medication adjestments including Ranexa. B. Cath November 2009/ mRCA - 2.75 x 23 Abbott Xience V drug-eluting stent ...11/26/2008 to distal  RCA leading to acute marginal.  C. Cath 07/2012 for CP - stable anatomy, med rx. d. cath 2015 and 05/30/2015 stable anatomy, consider Myoview if has CP again   Carotid artery disease ([place]) 08/01/2015   Doppler, May 29, 2015, 1-39% bilateral ICA    Cerebral ischemia    MRI November, 2010, chronic microvascular ischemia   CKD (chronic kidney disease), stage III ([place])    Concussion    Depression    Bipolar   Diabetes mellitus ([place])     Edema    Essential hypertension 06/05/2013   Overview:  July 2014: Controlled with Bystolic Sep 123456: Changed to atenolol.  Last Assessment & Plan:  Will change to atenolol for cost and follow Improved.  Medication compliance strongly encouraged BP: 116/64 mmHg    Overview:  July 2014: Controlled with Bystolic Sep 123456: Changed to atenolol.  Last Assessment & Plan:  Will change to atenolol for cost and follow   Falling episodes    these have occurred in the past and again recurring 2011   Family history of adverse reaction to anesthesia    "mother died during bypass surgery but not sure if it has to do with anesthesia"   Gastric ulcer    GERD (gastroesophageal reflux disease)    H/O medication noncompliance    Due to loss of insurance   H/O multiple concussions    Hard of hearing    Heart murmur    History of blood transfusion 12/20/2013   History of kidney stones    Hx of CABG    2000,  / one median sternotomy suture broken her chest x-ray November, 2010, no clinical significance   Hyperlipidemia    Hypertension    pt. denies   Iron deficiency anemia    Long term (current) use of anticoagulants [Z79.01] 07/20/2016   Low back pain 06/12/2009   Qualifier: Diagnosis of  By: [name]  Nephrolithiasis    Orthostasis    OSA (obstructive sleep apnea)    PAF (paroxysmal atrial fibrillation) ([place])    a. dx 2017, started on amiodarone/warfarin but patient intermittently noncompliant.   PSVT (paroxysmal supraventricular tachycardia)    RBBB 07/09/2009   Qualifier: Diagnosis of  By: [name], MD, [facility], [name]    RLS (restless legs syndrome) 09/19/2009   Qualifier: Diagnosis of  By: [name]   Overview:  July 2014: Controlled with Mirapex  Last Assessment & Plan:  Patient is doing well. Will continue current management and follow clinically.   Seizure disorder ([place]) 01/09/2017   Spondylosis    C5-6, C6-7 MRI 2010   Syncope 03/2016   TBI (traumatic brain injury)  ([place]) 2019   Thyroid disease    Tubulovillous adenoma of colon 2007   Vitamin D deficiency 05/17/2017   Wears glasses    Past Surgical History:  Procedure Laterality Date   ANTERIOR CERVICAL DECOMP/DISCECTOMY FUSION N/A 05/31/2016   Procedure: ANTERIOR CERVICAL DECOMPRESSION/DISCECTOMY FUSION CERVICAL FIVE-SIX,CERVICAL SIX-SEVEN;  Surgeon: [name], MD;  Location: [place] NEURO ORS;  Service: Neurosurgery;  Laterality: N/A;   CARDIAC CATHETERIZATION N/A 05/30/2015   Procedure: Left Heart Cath and Coronary Angiography;  Surgeon: [name], MD;  Location: [place] CV LAB;  Service: Cardiovascular;  Laterality: N/A;   CATARACT EXTRACTION     COLONOSCOPY     CORONARY ARTERY BYPASS GRAFT     2000   ESOPHAGOGASTRODUODENOSCOPY     LEFT HEART CATH AND CORS/GRAFTS ANGIOGRAPHY N/A 12/15/2017   Procedure: LEFT HEART CATH AND CORS/GRAFTS ANGIOGRAPHY;  Surgeon: [name], MD;  Location: [place] CV LAB;  Service: Cardiovascular;  Laterality: N/A;   LEFT HEART CATHETERIZATION WITH CORONARY/GRAFT ANGIOGRAM N/A 08/01/2012   Procedure: LEFT HEART CATHETERIZATION WITH Beatrix Fetters;  Surgeon: [name], MD;  Location[facility] CATH LAB;  Service: Cardiovascular;  Laterality: N/A;   LEFT HEART CATHETERIZATION WITH CORONARY/GRAFT ANGIOGRAM N/A 01/03/2015   Procedure: LEFT HEART CATHETERIZATION WITH Beatrix Fetters;  Surgeon: [name], MD;  Location: [facility] CATH LAB;  Service: Cardiovascular;  Laterality: N/A;   NASAL SEPTUM SURGERY     UP3   PERCUTANEOUS CORONARY STENT INTERVENTION (PCI-S)  10/06/2008   mRCA PCI  2.75 x 23 Abbott Xience V drug-eluting stent    RIGHT/LEFT HEART CATH AND CORONARY/GRAFT ANGIOGRAPHY N/A 01/07/2020   Procedure: RIGHT/LEFT HEART CATH AND CORONARY/GRAFT ANGIOGRAPHY;  Surgeon: [name], MD;  Location: [place] CV LAB;  Service: Cardiovascular;  Laterality: N/A;   TOTAL KNEE ARTHROPLASTY Right 09/22/2021   Procedure: RIGHT TOTAL  KNEE ARTHROPLASTY;  Surgeon: [name], MD;  Location: [facility] ORS;  Service: Orthopedics;  Laterality: Right;   ULTRASOUND GUIDANCE FOR VASCULAR ACCESS  12/15/2017   Procedure: Ultrasound Guidance For Vascular Access;  Surgeon: [name], MD;  Location: [place] CV LAB;  Service: Cardiovascular;;    Allergies  Allergies  Allergen Reactions   Morphine Other (See Comments)    Hallucinations   Shellfish-Derived Products Itching    History of Present Illness    AJAHNI BENZIGER  is a 77 year old male with the above mention past medical history who presents today for follow-up of CAD and atrial fibrillation.  Mr. Mannis has an extensive coronary history dating back to 2000 when he underwent CABG with subsequent left heart And 2015, 2016 and 2019 that showed stable coronaries.  In 2017 he suffered near syncope with transient confusion and completed a  MRI of the head that was negative for stroke.  He also reported falling asleep easily and was diagnosed with narcolepsy.  He was started on Plavix and heart monitor was worn that demonstrated atrial fibs/flutter and Plavix was transition to Eliquis.  He completed a 2D echo in 01/2020 that showed EF of 60 to 65%.  He reported to the ED 09/2022 with 3-week complaint of chest pain.  EKG was performed with no acute changes noted and delta troponins completed that were negative x 2.  He was sent for Lexiscan in the outpatient setting that showed no ischemia and was negative.  He was last seen by [name] in 09/2022 for post ED follow-up.  During visit patient had ongoing chest pain and amlodipine was stopped and amiodarone was decreased to 200 mg daily.  He presented to the ED 01/05/2023 with complaint of exertional chest pain that was relieved with nitroglycerin.  He was transported to the hospital via EMS and troponins were found to be negative x 2.  EKG was also reassuring and patient had resolution of angina in the ED.  He was given  advisement to follow-up with cardiology in the outpatient setting.   Since last being seen in the office patient reports***.  Patient denies chest pain, palpitations, dyspnea, PND, orthopnea, nausea, vomiting, dizziness, syncope, edema, weight gain, or early satiety.     ***Notes:  Home Medications    Current Outpatient Medications  Medication Sig Dispense Refill   amiodarone (PACERONE) 200 MG tablet Take 1 tablet (200 mg total) by mouth daily. 90 tablet 3   amitriptyline (ELAVIL) 10 MG tablet Take 1-3 tablets (10-30 mg total) by mouth at bedtime. 90 tablet 1   amoxicillin (AMOXIL) 500 MG capsule Take 4 capsules (2,000 mg total) by mouth 1 hour before dental procedure. 4 capsule 0   apixaban (ELIQUIS) 5 MG TABS tablet Take 1 tablet (5 mg total) by mouth 2 (two) times daily. 60 tablet 5   aspirin EC 81 MG tablet Take 81 mg by mouth daily. Swallow whole.     atorvastatin (LIPITOR) 80 MG tablet Take 1 tablet (80 mg total) by mouth daily. 90 tablet 1   BESIVANCE 0.6 % SUSP Instill 1 drop into right eye three times a day as directed. Please store upside down!! 5 mL 1   BESIVANCE 0.6 % SUSP Instill 1 drop into left eye three times a day as directed 5 mL 1   Continuous Blood Gluc Sensor (FREESTYLE LIBRE 2 SENSOR) MISC Use as instructed to check blood sugar, change every 14 days *No further refills until appt is scheduled* 6 each 0   COVID-19 mRNA vaccine 2023-2024 (COMIRNATY) syringe Inject into the muscle. 0.3 mL 0   diclofenac (VOLTAREN) 75 MG EC tablet Take 1 tablet (75 mg total) by mouth 2 (two) times daily. 20 tablet 0   Diclofenac Sodium (PENNSAID) 2 % SOLN Place 1 application onto the skin 2 (two) times daily. 112 g 2   divalproex (DEPAKOTE) 500 MG DR tablet Take 1 tablet (500 mg total) by mouth 2 (two) times daily. 60 tablet 5   donepezil (ARICEPT) 10 MG tablet Take 1 tablet (10 mg total) by mouth at bedtime. 90 tablet 4   fenofibrate 160 MG tablet Take 1 tablet (160 mg total) by mouth  daily. 30 tablet 0   glucose blood (ACCU-CHEK AVIVA) test strip Use as directed to check blood sugar 4 times daily.  DX E11.9 200 each 6   hydrocortisone 2.5 %  cream Apply on to the skin 2 (two) times daily. 30 g 1   hydrOXYzine (ATARAX) 10 MG tablet Take 1 tablet (10 mg total) by mouth 3 (three) times daily as needed for anxiety or itching. 30 tablet 1   influenza vaccine adjuvanted (FLUAD QUADRIVALENT) 0.5 ML injection Inject into the muscle. 0.5 mL 0   insulin glargine, 2 Unit Dial, (TOUJEO MAX SOLOSTAR) 300 UNIT/ML Solostar Pen Inject 65 Units into the skin daily in the afternoon. 30 mL 2   insulin lispro (HUMALOG KWIKPEN) 100 UNIT/ML KwikPen Inject 10 Units into the skin 3 (three) times daily. 30 mL 3   Insulin Pen Needle 31G X 5 MM MISC Use as directed to inject insulin in the morning, at noon, in the evening, and at bedtime. 400 each 3   isosorbide mononitrate (IMDUR) 120 MG 24 hr tablet Take 1 tablet (120 mg total) by mouth daily. 90 tablet 3   ketorolac (ACULAR) 0.5 % ophthalmic solution Place 1 drop into the right eye 4 times a day as directed 5 mL 1   ketorolac (ACULAR) 0.5 % ophthalmic solution Place 1 drop into the left eye 4 (four) times daily. 5 mL 1   levocetirizine (XYZAL) 5 MG tablet Take 1 tablet (5 mg total) by mouth every evening. 30 tablet 2   levothyroxine (SYNTHROID) 100 MCG tablet TAKE 1 TABLET (100 MCG TOTAL) BY MOUTH DAILY. 90 tablet 1   meclizine (ANTIVERT) 25 MG tablet Take 1 tablet (25 mg total) by mouth 3 (three) times daily as needed for dizziness. 30 tablet 0   memantine (NAMENDA) 10 MG tablet Take 1 tablet (10 mg total) by mouth 2 (two) times daily. 60 tablet 11   nitroGLYCERIN (NITROSTAT) 0.4 MG SL tablet PLACE 1 TABLET UNDER THE TONGUE EVERY 5 MINUTES AS NEEDED FOR CHEST PAIN. 25 tablet 1   pantoprazole (PROTONIX) 40 MG tablet Take 1 tablet (40 mg total) by mouth daily. 90 tablet 1   pramipexole (MIRAPEX) 1 MG tablet Take 1/2 to 1 tablet (0.5-1 mg total) by mouth  at bedtime. 90 tablet 0   prednisoLONE acetate (PRED FORTE) 1 % ophthalmic suspension Place 1 drop into the right eye 4 times a day as directed. Please shake bottle well before each use. 5 mL 1   prednisoLONE acetate (PRED FORTE) 1 % ophthalmic suspension Place 1 drop into the left eye 4 (four) times daily. 5 mL 1   QUEtiapine (SEROQUEL) 25 MG tablet Take 1 tablet (25 mg total) by mouth at bedtime. 30 tablet 6   ranolazine (RANEXA) 500 MG 12 hr tablet TAKE 1 TABLET BY MOUTH TWICE DAILY 60 tablet 5   sertraline (ZOLOFT) 100 MG tablet Take 1 tablet (100 mg total) by mouth 2 (two) times daily. 180 tablet 0   triamcinolone cream (KENALOG) 0.1 % Apply 1 application topically 2 (two) times daily. Use as needed for itchy rash 30 g 1   No current facility-administered medications for this visit.     Review of Systems  Please see the history of present illness.    (+)*** (+)***  All other systems reviewed and are otherwise negative except as noted above.  Physical Exam    Wt Readings from Last 3 Encounters:  01/27/23 176 lb (79.8 kg)  12/21/22 176 lb 9.6 oz (80.1 kg)  09/21/22 179 lb (81.2 kg)   [id number] were no vitals filed for this visit.,There is no height or weight on file to calculate BMI.  Constitutional:  Appearance: Healthy appearance. Not in distress.  Neck:     Vascular: JVD normal.  Pulmonary:     Effort: Pulmonary effort is normal.     Breath sounds: No wheezing. No rales. Diminished in the bases Cardiovascular:     Normal rate. Regular rhythm. Normal S1. Normal S2.      Murmurs: There is no murmur.  Edema:    Peripheral edema absent.  Abdominal:     Palpations: Abdomen is soft non tender. There is no hepatomegaly.  Skin:    General: Skin is warm and dry.  Neurological:     General: No focal deficit present.     Mental Status: Alert and oriented to person, place and time.     Cranial Nerves: Cranial nerves are intact.  EKG/LABS/ Recent Cardiac Studies    ECG  personally reviewed by me today - ***  Risk Assessment/Calculations:   {Does this patient have ATRIAL FIBRILLATION?:[phone]}        Lab Results  Component Value Date   WBC 9.9 01/05/2023   HGB 13.7 01/05/2023   HCT 40.3 01/05/2023   MCV 90.2 01/05/2023   PLT 170 01/05/2023   Lab Results  Component Value Date   CREATININE 1.60 (H) 01/05/2023   BUN 26 (H) 01/05/2023   NA 137 01/05/2023   K 4.1 01/05/2023   CL 104 01/05/2023   CO2 25 01/05/2023   Lab Results  Component Value Date   ALT 31 11/12/2022   AST 27 11/12/2022   ALKPHOS 95 11/12/2022   BILITOT 0.6 11/12/2022   Lab Results  Component Value Date   CHOL 95 11/12/2022   HDL 41.00 11/12/2022   LDLCALC 37 11/12/2022   LDLDIRECT 77.0 08/20/2019   TRIG 81.0 11/12/2022   CHOLHDL 2 11/12/2022    Lab Results  Component Value Date   HGBA1C 8.6 (H) 11/12/2022    Cardiac Studies & Procedures   CARDIAC CATHETERIZATION  CARDIAC CATHETERIZATION 01/07/2020  Narrative  Right heart cath demonstrated normal filling pressures and pulmonary artery pressure.  Total occlusion of the mid LAD and diagonal.  Ramus intermedius is also totally occluded.  Mild to moderate disease is noted in the distal circumflex.  The native right coronary is totally occluded beyond the origin of the PDA.  The bypass graft to the continuation of the right coronary is widely patent.  The bypass graft to the diagonal is widely patent.  The bypass graft to the ramus intermedius is widely patent.  LIMA to the LAD is widely patent.  Left ventricular function was not assessed with this study.  Left ventricular end-diastolic pressure was normal.  RECOMMENDATIONS:   In review of images, no significant change has occurred compared to 2019.  If angina, consider microvascular origin.  Uncertain explanation for the patient's dyspnea.  Findings Coronary Findings Diagnostic  Dominance: Right  Left Anterior Descending Prox LAD lesion is 90%  stenosed. The lesion is segmental and chronically occluded.  First Diagonal Branch Vessel is angiographically normal. Ost 1st Diag lesion is 100% stenosed. The lesion is located at the major branch and discrete.  Ramus Intermedius Vessel is small. Ost Ramus to Ramus lesion is 100% stenosed.  Left Circumflex Prox Cx lesion is 20% stenosed. The lesion is discrete. Mid Cx lesion is 30% stenosed. The lesion is located at the major branch and discrete.  First Obtuse Marginal Branch Vessel is small in size.  Right Coronary Artery Mid RCA to Dist RCA lesion is 5% stenosed. The lesion was previously  treatedover 2 years ago. Nov 2009  Right Posterior Atrioventricular Artery Vessel is moderate in size. RPAV lesion is 100% stenosed.  First Right Posterolateral Branch Vessel is small in size.  Second Right Posterolateral Branch Vessel is small in size.  Third Right Posterolateral Branch Vessel is small in size.  LIMA LIMA Graft To Dist LAD LIMA and is normal in caliber. Small downstream distal LAD - minimal CAD  Single Graft Graft To RPAV and is normal in caliber.  Graft To 1st Diag  Graft To Ramus  Intervention  No interventions have been documented.   CARDIAC CATHETERIZATION  CARDIAC CATHETERIZATION 12/15/2017  Narrative  Ost 1st Diag to 1st Diag lesion is 95% stenosed.  Prox LAD to Mid LAD lesion is 90% stenosed.  Ost 2nd Diag to 2nd Diag lesion is 100% stenosed.  Prox Cx lesion is 20% stenosed.  Mid Cx lesion is 30% stenosed.  Mid RCA to Dist RCA lesion is 5% stenosed.  LIMA and is normal in caliber.  And is normal in caliber.  The flow in the graft is reversed.  There is competitive flow.  And is normal in caliber.  And is normal in caliber.  Post Atrio lesion is 100% stenosed.  Mid LAD lesion is 100% stenosed.  The left ventricular systolic function is normal.  LV end diastolic pressure is normal.  The left ventricular ejection fraction  is 55-65% by visual estimate.  There is no aortic valve stenosis.  Severe 2 vessel CAD.  All 4 bypass grafts are patent.  Continue medical therapy.  Findings Coronary Findings Diagnostic  Dominance: Right  Left Anterior Descending Prox LAD to Mid LAD lesion is 90% stenosed. The lesion is segmental and chronically occluded. Mid LAD lesion is 100% stenosed. The lesion is segmental and chronically occluded. The lesion was previously treatedover 2 years ago.  First Diagonal Branch Vessel is angiographically normal. Ost 1st Diag to 1st Diag lesion is 95% stenosed. The lesion is located at the major branch and discrete.  Second Diagonal Branch Vessel is small in size. There is moderate  diffuse disease in the vessel. Ost 2nd Diag to 2nd Diag lesion is 100% stenosed. The lesion is discrete. The lesion was previously treatedover 2 years ago.  Ramus Intermedius Vessel is small.  Left Circumflex Prox Cx lesion is 20% stenosed. The lesion is discrete. Mid Cx lesion is 30% stenosed. The lesion is located at the major branch and discrete.  First Obtuse Marginal Branch Vessel is small in size.  Right Coronary Artery Mid RCA to Dist RCA lesion is 5% stenosed. The lesion was previously treatedover 2 years ago. Nov 2009  Right Posterior Atrioventricular Artery Vessel is moderate in size. Post Atrio lesion is 100% stenosed.  First Right Posterolateral Branch Vessel is small in size.  Second Right Posterolateral Branch Vessel is small in size.  Third Right Posterolateral Branch Vessel is small in size.  LIMA LIMA Graft To Dist LAD LIMA and is normal in caliber. Small downstream distal LAD - minimal CAD  Single Graft Graft To 1st Diag and is normal in caliber. The flow in the graft is reversed. There is competitive flow.  Single Graft Graft To 2nd Diag and is normal in caliber.  Single Graft Graft To RPAV and is normal in caliber.  Intervention  No interventions have been  documented.   STRESS TESTS  NM MYOCAR MULTI W/SPECT W 02/14/2022  Narrative CLINICAL DATA:  Nonspecific chest pain.  EXAM: MYOCARDIAL IMAGING WITH SPECT (REST AND  PHARMACOLOGIC-STRESS)  GATED LEFT VENTRICULAR WALL MOTION STUDY  LEFT VENTRICULAR EJECTION FRACTION  TECHNIQUE: Standard myocardial SPECT imaging was performed after resting intravenous injection of 9.8 mCi Tc-53m tetrofosmin. Subsequently, intravenous infusion of Lexiscan was performed under the supervision of the Cardiology staff. At peak effect of the drug, 32.7 mCi Tc-74m tetrofosmin was injected intravenously and standard myocardial SPECT imaging was performed. Quantitative gated imaging was also performed to evaluate left ventricular wall motion, and estimate left ventricular ejection fraction.  COMPARISON:  None.  FINDINGS: Perfusion: No decreased activity in the left ventricle on stress imaging to suggest reversible ischemia or infarction.  Wall Motion: Normal left ventricular wall motion. No left ventricular dilation.  Left Ventricular Ejection Fraction: 58 %  End diastolic volume 84 ml  End systolic volume 36 ml  IMPRESSION: 1. No reversible ischemia or infarction.  2. Normal left ventricular wall motion.  3. Left ventricular ejection fraction 58%  4. Non invasive risk stratification*: Low  *2012 Appropriate Use Criteria for Coronary Revascularization Focused Update: J Am Coll Cardiol. [id number]. http://content.airportbarriers.com.aspx?articleid=1201161   Electronically Signed By: Abigail Miyamoto [name] On: 02/14/2022 14:58   ECHOCARDIOGRAM  ECHOCARDIOGRAM COMPLETE 02/13/2022  Narrative ECHOCARDIOGRAM REPORT    Patient Name:   [name] [name] Date of Exam: 02/13/2022 Medical Rec #:  [id number]         Height:       69.0 in Accession #:    [id number]        Weight:       190.0 lb Date of Birth:  02/20/1946         BSA:          2.021 m Patient Age:    66 years           BP:           122/54 mmHg Patient Gender: M                 HR:           55 bpm. Exam Location:  Inpatient  Procedure: 2D Echo, Cardiac Doppler, Color Doppler and Strain Analysis  Indications:    Chest Pain R07.9  History:        Patient has prior history of Echocardiogram examinations, most recent 01/08/2020. CAD, Prior CABG, TIA and Carotid Disease, Arrythmias:RBBB, Atrial Fibrillation and Bradycardia, Signs/Symptoms:Murmur; Risk Factors:Diabetes, Hypertension, Dyslipidemia and Sleep Apnea. Chronic kidney disease.  Sonographer:    Darlina Sicilian RDCS Referring Phys: [id number] [place]   1. Left ventricular ejection fraction, by estimation, is 60 to 65%. The left ventricle has normal function. The left ventricle has no regional wall motion abnormalities. Left ventricular diastolic parameters were normal. The average left ventricular global longitudinal strain is -19.9 %. The global longitudinal strain is normal. 2. Right ventricular systolic function is normal. The right ventricular size is normal. 3. Left atrial size was mildly dilated. 4. The mitral valve is normal in structure. Trivial mitral valve regurgitation. No evidence of mitral stenosis. 5. The aortic valve is tricuspid. Aortic valve regurgitation is not visualized. No aortic stenosis is present. 6. The inferior vena cava is normal in size with greater than 50% respiratory variability, suggesting right atrial pressure of 3 mmHg.  Comparison(s): No significant change from prior study.  Conclusion(s)/Recommendation(s): Otherwise normal echocardiogram, with minor abnormalities described in the report.  FINDINGS Left Ventricle: Left ventricular ejection fraction, by estimation, is 60 to 65%. The left ventricle has normal function. The left ventricle has  no regional wall motion abnormalities. The average left ventricular global longitudinal strain is -19.9 %. The global longitudinal strain is normal. The  left ventricular internal cavity size was normal in size. There is no left ventricular hypertrophy. Left ventricular diastolic parameters were normal.  Right Ventricle: The right ventricular size is normal. No increase in right ventricular wall thickness. Right ventricular systolic function is normal.  Left Atrium: Left atrial size was mildly dilated.  Right Atrium: Right atrial size was normal in size.  Pericardium: There is no evidence of pericardial effusion.  Mitral Valve: The mitral valve is normal in structure. Trivial mitral valve regurgitation. No evidence of mitral valve stenosis.  Tricuspid Valve: The tricuspid valve is normal in structure. Tricuspid valve regurgitation is trivial. No evidence of tricuspid stenosis.  Aortic Valve: The aortic valve is tricuspid. Aortic valve regurgitation is not visualized. No aortic stenosis is present.  Pulmonic Valve: The pulmonic valve was not well visualized. Pulmonic valve regurgitation is not visualized. No evidence of pulmonic stenosis.  Aorta: The aortic root, ascending aorta and aortic arch are all structurally normal, with no evidence of dilitation or obstruction.  Venous: The inferior vena cava is normal in size with greater than 50% respiratory variability, suggesting right atrial pressure of 3 mmHg.  IAS/Shunts: The atrial septum is grossly normal.   LEFT VENTRICLE PLAX 2D LVIDd:         4.70 cm   Diastology LVIDs:         2.90 cm   LV e' medial:    6.75 cm/s LV PW:         1.00 cm   LV E/e' medial:  10.9 LV IVS:        1.10 cm   LV e' lateral:   10.80 cm/s LVOT diam:     2.00 cm   LV E/e' lateral: 6.8 LV SV:         63 LV SV Index:   31        2D Longitudinal Strain LVOT Area:     3.14 cm  2D Strain GLS (A2C):   -17.7 % 2D Strain GLS (A3C):   -19.8 % 2D Strain GLS (A4C):   -22.1 % 2D Strain GLS Avg:     -19.9 %  RIGHT VENTRICLE RV S prime:     10.30 cm/s TAPSE (M-mode): 1.8 cm  LEFT ATRIUM             Index         RIGHT ATRIUM           Index LA diam:        4.30 cm 2.13 cm/m   RA Area:     16.30 cm LA Vol (A2C):   57.1 ml 28.25 ml/m  RA Volume:   35.00 ml  17.31 ml/m LA Vol (A4C):   56.3 ml 27.85 ml/m LA Biplane Vol: 59.8 ml 29.58 ml/m AORTIC VALVE LVOT Vmax:   84.00 cm/s LVOT Vmean:  54.200 cm/s LVOT VTI:    0.201 m  AORTA Ao Root diam: 3.40 cm Ao Asc diam:  3.00 cm  MITRAL VALVE MV Area (PHT): 3.21 cm    SHUNTS MV Decel Time: 236 msec    Systemic VTI:  0.20 m MV E velocity: 73.90 cm/s  Systemic Diam: 2.00 cm MV A velocity: 59.20 cm/s MV E/A ratio:  1.25  Buford Dresser MD Electronically signed by Buford Dresser MD Signature Date/Time: 02/13/2022/1:34:33 PM    Final  MONITORS  CARDIAC EVENT MONITOR 08/14/2020           Assessment & Plan    1.  History of CAD/chest pain: -s/p CABG in 2000 with subsequent PCI/DES to RCA in 2009.  Patient had Lexiscan Myoview completed in March 2023 that was normal.  He was seen recently 01/05/2023 in the ED with complaint of chest pain that was relieved with nitroglycerin. -Today patient reports***  2.  Paroxysmal atrial fibrillation: -Patient currently rate controlled with amiodarone 200 mg daily -Continue Eliquis***  3.  Essential hypertension: -Patient's blood pressure today was***  4.  Hyperlipidemia: -Patient's most recent LDL was*** Continue***  5.  DM type II: -Patient's last hemoglobin A1c was***      Disposition: Follow-up with Larae Grooms, MD or APP in *** months {Are you ordering a CV Procedure (e.g. stress test, cath, DCCV, TEE, etc)?   Press F2        :[id number]   Medication Adjustments/Labs and Tests Ordered: Current medicines are reviewed at length with the patient today.  Concerns regarding medicines are outlined above.   Signed, Mable Fill, Marissa Nestle, NP 02/22/2023, 7:36 AM [facility]  Note:  This document was prepared using Dragon voice recognition software  and may include unintentional dictation errors.

## 2023-02-23 ENCOUNTER — Other Ambulatory Visit (HOSPITAL_BASED_OUTPATIENT_CLINIC_OR_DEPARTMENT_OTHER): Payer: Self-pay

## 2023-02-23 DIAGNOSIS — K219 Gastro-esophageal reflux disease without esophagitis: Secondary | ICD-10-CM | POA: Diagnosis not present

## 2023-02-23 DIAGNOSIS — Z7901 Long term (current) use of anticoagulants: Secondary | ICD-10-CM | POA: Diagnosis not present

## 2023-02-23 DIAGNOSIS — Z794 Long term (current) use of insulin: Secondary | ICD-10-CM | POA: Diagnosis not present

## 2023-02-23 DIAGNOSIS — I2510 Atherosclerotic heart disease of native coronary artery without angina pectoris: Secondary | ICD-10-CM | POA: Diagnosis not present

## 2023-02-23 DIAGNOSIS — G2581 Restless legs syndrome: Secondary | ICD-10-CM | POA: Diagnosis not present

## 2023-02-23 DIAGNOSIS — F32A Depression, unspecified: Secondary | ICD-10-CM | POA: Diagnosis not present

## 2023-02-23 DIAGNOSIS — Z7982 Long term (current) use of aspirin: Secondary | ICD-10-CM | POA: Diagnosis not present

## 2023-02-23 DIAGNOSIS — D509 Iron deficiency anemia, unspecified: Secondary | ICD-10-CM | POA: Diagnosis not present

## 2023-02-23 DIAGNOSIS — I480 Paroxysmal atrial fibrillation: Secondary | ICD-10-CM | POA: Diagnosis not present

## 2023-02-23 DIAGNOSIS — H9190 Unspecified hearing loss, unspecified ear: Secondary | ICD-10-CM | POA: Diagnosis not present

## 2023-02-23 DIAGNOSIS — I4510 Unspecified right bundle-branch block: Secondary | ICD-10-CM | POA: Diagnosis not present

## 2023-02-23 DIAGNOSIS — F0284 Dementia in other diseases classified elsewhere, unspecified severity, with anxiety: Secondary | ICD-10-CM | POA: Diagnosis not present

## 2023-02-23 DIAGNOSIS — E559 Vitamin D deficiency, unspecified: Secondary | ICD-10-CM | POA: Diagnosis not present

## 2023-02-23 DIAGNOSIS — G4733 Obstructive sleep apnea (adult) (pediatric): Secondary | ICD-10-CM | POA: Diagnosis not present

## 2023-02-23 DIAGNOSIS — D631 Anemia in chronic kidney disease: Secondary | ICD-10-CM | POA: Diagnosis not present

## 2023-02-23 DIAGNOSIS — F0283 Dementia in other diseases classified elsewhere, unspecified severity, with mood disturbance: Secondary | ICD-10-CM | POA: Diagnosis not present

## 2023-02-23 DIAGNOSIS — E785 Hyperlipidemia, unspecified: Secondary | ICD-10-CM | POA: Diagnosis not present

## 2023-02-23 DIAGNOSIS — E039 Hypothyroidism, unspecified: Secondary | ICD-10-CM | POA: Diagnosis not present

## 2023-02-23 DIAGNOSIS — M47812 Spondylosis without myelopathy or radiculopathy, cervical region: Secondary | ICD-10-CM | POA: Diagnosis not present

## 2023-02-23 DIAGNOSIS — E1122 Type 2 diabetes mellitus with diabetic chronic kidney disease: Secondary | ICD-10-CM | POA: Diagnosis not present

## 2023-02-23 DIAGNOSIS — N183 Chronic kidney disease, stage 3 unspecified: Secondary | ICD-10-CM | POA: Diagnosis not present

## 2023-02-23 DIAGNOSIS — I129 Hypertensive chronic kidney disease with stage 1 through stage 4 chronic kidney disease, or unspecified chronic kidney disease: Secondary | ICD-10-CM | POA: Diagnosis not present

## 2023-02-23 DIAGNOSIS — I471 Supraventricular tachycardia, unspecified: Secondary | ICD-10-CM | POA: Diagnosis not present

## 2023-02-23 NOTE — Assessment & Plan Note (Deleted)
Supplement and monitor 

## 2023-02-23 NOTE — Assessment & Plan Note (Deleted)
No recent exacerbation 

## 2023-02-23 NOTE — Assessment & Plan Note (Deleted)
Encourage heart healthy diet such as MIND or DASH diet, increase exercise, avoid trans fats, simple carbohydrates and processed foods, consider a krill or fish or flaxseed oil cap daily. Tolerating Atorvastatin 

## 2023-02-23 NOTE — Assessment & Plan Note (Deleted)
hgba1c elevated at last check will repeat, minimize simple carbs. Increase exercise as tolerated. Continue current meds

## 2023-02-23 NOTE — Assessment & Plan Note (Deleted)
On Levothyroxine, continue to monitor 

## 2023-02-23 NOTE — Assessment & Plan Note (Deleted)
Well controlled, no changes to meds. Encouraged heart healthy diet such as the DASH diet and exercise as tolerated.  °

## 2023-02-24 ENCOUNTER — Ambulatory Visit: Payer: Medicare Other | Admitting: Family Medicine

## 2023-02-24 ENCOUNTER — Ambulatory Visit: Payer: Medicare Other | Admitting: Nurse Practitioner

## 2023-02-24 DIAGNOSIS — I10 Essential (primary) hypertension: Secondary | ICD-10-CM

## 2023-02-24 DIAGNOSIS — E785 Hyperlipidemia, unspecified: Secondary | ICD-10-CM

## 2023-02-24 DIAGNOSIS — R079 Chest pain, unspecified: Secondary | ICD-10-CM

## 2023-02-24 DIAGNOSIS — I480 Paroxysmal atrial fibrillation: Secondary | ICD-10-CM

## 2023-02-24 DIAGNOSIS — E559 Vitamin D deficiency, unspecified: Secondary | ICD-10-CM

## 2023-02-24 DIAGNOSIS — I5032 Chronic diastolic (congestive) heart failure: Secondary | ICD-10-CM

## 2023-02-24 DIAGNOSIS — I25111 Atherosclerotic heart disease of native coronary artery with angina pectoris with documented spasm: Secondary | ICD-10-CM

## 2023-02-24 DIAGNOSIS — E034 Atrophy of thyroid (acquired): Secondary | ICD-10-CM

## 2023-02-24 DIAGNOSIS — E1142 Type 2 diabetes mellitus with diabetic polyneuropathy: Secondary | ICD-10-CM

## 2023-02-24 NOTE — Progress Notes (Shared)
Subjective:   By signing my name below, I, [name], attest that this documentation has been prepared under the direction and in the presence of [name], MD., 02/24/2023.   Patient ID: [name] [name], male    DOB: [birth year], 77 y.o.   MRN: [id number]  No chief complaint on file.  HPI Patient is in today for an office visit. He denies CP/palpitations/SOB/HA/fever/ chills/GI or GU symptoms.  Past Medical History:  Diagnosis Date   Acquired atrophy of thyroid 06/05/2013   Overview:  July 2014: controlled on synthroid 58mcg since 2012 May 2015: decreased to Synthroid 50 mcg  Last Assessment & Plan:  Pt doing well with most recent TSH within normal range.  Will continue current dosage of Synthroid 73mcg. Pt reminded to take medication on empty stomach. Will continue to monitor with periodic laboratory assessment in 3 months.    Anemia    hemoglobin 7.4, iron deficiency, January, 2011, 2 unit transfusion, endoscopy normal, capsule endoscopy February, 2011 no small bowel abnormalities.   Most likely source gastric erosions, followed by GI   Anxiety    Bradycardia    CAD (coronary artery disease)    A. CABG in 2000,status post cardiac cath in 2006, 2009 ....continued chest pain and SOB despite oral medication adjestments including Ranexa. B. Cath November 2009/ mRCA - 2.75 x 23 Abbott Xience V drug-eluting stent ...11/26/2008 to distal  RCA leading to acute marginal.  C. Cath 07/2012 for CP - stable anatomy, med rx. d. cath 2015 and 05/30/2015 stable anatomy, consider Myoview if has CP again   Carotid artery disease ([place]) 08/01/2015   Doppler, May 29, 2015, 1-39% bilateral ICA    Cerebral ischemia    MRI November, 2010, chronic microvascular ischemia   CKD (chronic kidney disease), stage III ([place])    Concussion    Depression    Bipolar   Diabetes mellitus ([place])    Edema    Essential hypertension 06/05/2013   Overview:  July 2014: Controlled with Bystolic Sep 123456: Changed  to atenolol.  Last Assessment & Plan:  Will change to atenolol for cost and follow Improved.  Medication compliance strongly encouraged BP: 116/64 mmHg    Overview:  July 2014: Controlled with Bystolic Sep 123456: Changed to atenolol.  Last Assessment & Plan:  Will change to atenolol for cost and follow   Falling episodes    these have occurred in the past and again recurring 2011   Family history of adverse reaction to anesthesia    "mother died during bypass surgery but not sure if it has to do with anesthesia"   Gastric ulcer    GERD (gastroesophageal reflux disease)    H/O medication noncompliance    Due to loss of insurance   H/O multiple concussions    Hard of hearing    Heart murmur    History of blood transfusion 12/20/2013   History of kidney stones    Hx of CABG    2000,  / one median sternotomy suture broken her chest x-ray November, 2010, no clinical significance   Hyperlipidemia    Hypertension    pt. denies   Iron deficiency anemia    Long term (current) use of anticoagulants [Z79.01] 07/20/2016   Low back pain 06/12/2009   Qualifier: Diagnosis of  By: [name]    Nephrolithiasis    Orthostasis    OSA (obstructive sleep apnea)    PAF (paroxysmal atrial fibrillation) ([place])    a.  dx 2017, started on amiodarone/warfarin but patient intermittently noncompliant.   PSVT (paroxysmal supraventricular tachycardia)    RBBB 07/09/2009   Qualifier: Diagnosis of  By: [name], MD, [facility], [name]    RLS (restless legs syndrome) 09/19/2009   Qualifier: Diagnosis of  By: [name]   Overview:  July 2014: Controlled with Mirapex  Last Assessment & Plan:  Patient is doing well. Will continue current management and follow clinically.   Seizure disorder ([place]) 01/09/2017   Spondylosis    C5-6, C6-7 MRI 2010   Syncope 03/2016   TBI (traumatic brain injury) ([place]) 2019   Thyroid disease    Tubulovillous adenoma of colon 2007   Vitamin D deficiency 05/17/2017   Wears  glasses     Past Surgical History:  Procedure Laterality Date   ANTERIOR CERVICAL DECOMP/DISCECTOMY FUSION N/A 05/31/2016   Procedure: ANTERIOR CERVICAL DECOMPRESSION/DISCECTOMY FUSION CERVICAL FIVE-SIX,CERVICAL SIX-SEVEN;  Surgeon: [name], MD;  Location: [facility];  Service: Neurosurgery;  Laterality: N/A;   CARDIAC CATHETERIZATION N/A 05/30/2015   Procedure: Left Heart Cath and Coronary Angiography;  Surgeon: [name], MD;  Location: [place] CV LAB;  Service: Cardiovascular;  Laterality: N/A;   CATARACT EXTRACTION     COLONOSCOPY     CORONARY ARTERY BYPASS GRAFT     2000   ESOPHAGOGASTRODUODENOSCOPY     LEFT HEART CATH AND CORS/GRAFTS ANGIOGRAPHY N/A 12/15/2017   Procedure: LEFT HEART CATH AND CORS/GRAFTS ANGIOGRAPHY;  Surgeon: [name], MD;  Location: [place] CV LAB;  Service: Cardiovascular;  Laterality: N/A;   LEFT HEART CATHETERIZATION WITH CORONARY/GRAFT ANGIOGRAM N/A 08/01/2012   Procedure: LEFT HEART CATHETERIZATION WITH Beatrix Fetters;  Surgeon: [name], MD;  Location: [facility] CATH LAB;  Service: Cardiovascular;  Laterality: N/A;   LEFT HEART CATHETERIZATION WITH CORONARY/GRAFT ANGIOGRAM N/A 01/03/2015   Procedure: LEFT HEART CATHETERIZATION WITH Beatrix Fetters;  Surgeon: [name], MD;  Location: [facility] CATH LAB;  Service: Cardiovascular;  Laterality: N/A;   NASAL SEPTUM SURGERY     UP3   PERCUTANEOUS CORONARY STENT INTERVENTION (PCI-S)  10/06/2008   mRCA PCI  2.75 x 23 Abbott Xience V drug-eluting stent    RIGHT/LEFT HEART CATH AND CORONARY/GRAFT ANGIOGRAPHY N/A 01/07/2020   Procedure: RIGHT/LEFT HEART CATH AND CORONARY/GRAFT ANGIOGRAPHY;  Surgeon: [name], MD;  Location: [place] CV LAB;  Service: Cardiovascular;  Laterality: N/A;   TOTAL KNEE ARTHROPLASTY Right 09/22/2021   Procedure: RIGHT TOTAL KNEE ARTHROPLASTY;  Surgeon: [name], MD;  Location: [facility] ORS;  Service: Orthopedics;  Laterality: Right;    ULTRASOUND GUIDANCE FOR VASCULAR ACCESS  12/15/2017   Procedure: Ultrasound Guidance For Vascular Access;  Surgeon: [name], MD;  Location: [place] CV LAB;  Service: Cardiovascular;;    Family History  Problem Relation Age of Onset   Pancreatic cancer Brother    Diabetes Brother    Coronary artery disease Brother    Stroke Brother    Diabetes Brother    Diabetes Mother    Heart failure Mother    Heart failure Father    Hypothyroidism Brother    Coronary artery disease Brother    Other Brother        colon surgery   Heart attack Other        Nephew   Irregular heart beat Daughter    Cancer Maternal Grandmother        unknown    Colon cancer Neg Hx    Stomach cancer  Neg Hx    Liver cancer Neg Hx    Rectal cancer Neg Hx    Esophageal cancer Neg Hx     Social History   Socioeconomic History   Marital status: Married    Spouse name: Not on file   Number of children: 4   Years of education: 13   Highest education level: Not on file  Occupational History   Occupation: retired  Tobacco Use   Smoking status: Never    Passive exposure: Never   Smokeless tobacco: Never  Vaping Use   Vaping Use: Never used  Substance and Sexual Activity   Alcohol use: No    Alcohol/week: 0.0 standard drinks of alcohol    Comment: stopped drinking in 1998   Drug use: No   Sexual activity: Not Currently    Partners: Female  Other Topics Concern   Not on file  Social History Narrative   Patient is right handed.   Patient does not drink caffeine.   Social Determinants of Health   Financial Resource Strain: Medium Risk (08/02/2022)   Overall Financial Resource Strain (CARDIA)    Difficulty of Paying Living Expenses: Somewhat hard  Food Insecurity: No Food Insecurity (08/02/2022)   Hunger Vital Sign    Worried About Running Out of Food in the Last Year: Never true    Ran Out of Food in the Last Year: Never true  Transportation Needs: No Transportation Needs  (08/02/2022)   PRAPARE - Hydrologist (Medical): No    Lack of Transportation (Non-Medical): No  Physical Activity: Insufficiently Active (08/02/2022)   Exercise Vital Sign    Days of Exercise per Week: 7 days    Minutes of Exercise per Session: 20 min  Stress: Stress Concern Present (08/02/2022)   [place]    Feeling of Stress : To some extent  Social Connections: Moderately Isolated (08/02/2022)   Social Connection and Isolation Panel [NHANES]    Frequency of Communication with Friends and Family: More than three times a week    Frequency of Social Gatherings with Friends and Family: More than three times a week    Attends Religious Services: More than 4 times per year    Active Member of [employer] or Organizations: No    Attends Archivist Meetings: Never    Marital Status: Widowed  Intimate Partner Violence: Not At Risk (08/02/2022)   Humiliation, Afraid, Rape, and Kick questionnaire    Fear of Current or Ex-Partner: No    Emotionally Abused: No    Physically Abused: No    Sexually Abused: No    Outpatient Medications Prior to Visit  Medication Sig Dispense Refill   amiodarone (PACERONE) 200 MG tablet Take 1 tablet (200 mg total) by mouth daily. 90 tablet 3   amitriptyline (ELAVIL) 10 MG tablet Take 1-3 tablets (10-30 mg total) by mouth at bedtime. 90 tablet 1   amoxicillin (AMOXIL) 500 MG capsule Take 4 capsules (2,000 mg total) by mouth 1 hour before dental procedure. 4 capsule 0   apixaban (ELIQUIS) 5 MG TABS tablet Take 1 tablet (5 mg total) by mouth 2 (two) times daily. 60 tablet 5   aspirin EC 81 MG tablet Take 81 mg by mouth daily. Swallow whole.     atorvastatin (LIPITOR) 80 MG tablet Take 1 tablet (80 mg total) by mouth daily. 90 tablet 1   BESIVANCE 0.6 % SUSP Instill 1 drop into right  eye three times a day as directed. Please store upside down!! 5 mL 1   BESIVANCE 0.6 %  SUSP Instill 1 drop into left eye three times a day as directed 5 mL 1   Continuous Blood Gluc Sensor (FREESTYLE LIBRE 2 SENSOR) MISC Use as instructed to check blood sugar, change every 14 days *No further refills until appt is scheduled* 6 each 0   COVID-19 mRNA vaccine 2023-2024 (COMIRNATY) syringe Inject into the muscle. 0.3 mL 0   diclofenac (VOLTAREN) 75 MG EC tablet Take 1 tablet (75 mg total) by mouth 2 (two) times daily. 20 tablet 0   Diclofenac Sodium (PENNSAID) 2 % SOLN Place 1 application onto the skin 2 (two) times daily. 112 g 2   divalproex (DEPAKOTE) 500 MG DR tablet Take 1 tablet (500 mg total) by mouth 2 (two) times daily. 60 tablet 5   donepezil (ARICEPT) 10 MG tablet Take 1 tablet (10 mg total) by mouth at bedtime. 90 tablet 4   fenofibrate 160 MG tablet Take 1 tablet (160 mg total) by mouth daily. 30 tablet 0   glucose blood (ACCU-CHEK AVIVA) test strip Use as directed to check blood sugar 4 times daily.  DX E11.9 200 each 6   hydrocortisone 2.5 % cream Apply on to the skin 2 (two) times daily. 30 g 1   hydrOXYzine (ATARAX) 10 MG tablet Take 1 tablet (10 mg total) by mouth 3 (three) times daily as needed for anxiety or itching. 30 tablet 1   influenza vaccine adjuvanted (FLUAD QUADRIVALENT) 0.5 ML injection Inject into the muscle. 0.5 mL 0   insulin glargine, 2 Unit Dial, (TOUJEO MAX SOLOSTAR) 300 UNIT/ML Solostar Pen Inject 65 Units into the skin daily in the afternoon. 30 mL 2   insulin lispro (HUMALOG KWIKPEN) 100 UNIT/ML KwikPen Inject 10 Units into the skin 3 (three) times daily. 30 mL 3   Insulin Pen Needle 31G X 5 MM MISC Use as directed to inject insulin in the morning, at noon, in the evening, and at bedtime. 400 each 3   isosorbide mononitrate (IMDUR) 120 MG 24 hr tablet Take 1 tablet (120 mg total) by mouth daily. 90 tablet 3   ketorolac (ACULAR) 0.5 % ophthalmic solution Place 1 drop into the right eye 4 times a day as directed 5 mL 1   ketorolac (ACULAR) 0.5 %  ophthalmic solution Place 1 drop into the left eye 4 (four) times daily. 5 mL 1   levocetirizine (XYZAL) 5 MG tablet Take 1 tablet (5 mg total) by mouth every evening. 30 tablet 2   levothyroxine (SYNTHROID) 100 MCG tablet TAKE 1 TABLET (100 MCG TOTAL) BY MOUTH DAILY. 90 tablet 1   meclizine (ANTIVERT) 25 MG tablet Take 1 tablet (25 mg total) by mouth 3 (three) times daily as needed for dizziness. 30 tablet 0   memantine (NAMENDA) 10 MG tablet Take 1 tablet (10 mg total) by mouth 2 (two) times daily. 60 tablet 11   nitroGLYCERIN (NITROSTAT) 0.4 MG SL tablet PLACE 1 TABLET UNDER THE TONGUE EVERY 5 MINUTES AS NEEDED FOR CHEST PAIN. 25 tablet 1   pantoprazole (PROTONIX) 40 MG tablet Take 1 tablet (40 mg total) by mouth daily. 90 tablet 1   pramipexole (MIRAPEX) 1 MG tablet Take 1/2 to 1 tablet (0.5-1 mg total) by mouth at bedtime. 90 tablet 0   prednisoLONE acetate (PRED FORTE) 1 % ophthalmic suspension Place 1 drop into the right eye 4 times a day as directed. Please  shake bottle well before each use. 5 mL 1   prednisoLONE acetate (PRED FORTE) 1 % ophthalmic suspension Place 1 drop into the left eye 4 (four) times daily. 5 mL 1   QUEtiapine (SEROQUEL) 25 MG tablet Take 1 tablet (25 mg total) by mouth at bedtime. 30 tablet 6   ranolazine (RANEXA) 500 MG 12 hr tablet TAKE 1 TABLET BY MOUTH TWICE DAILY 60 tablet 5   sertraline (ZOLOFT) 100 MG tablet Take 1 tablet (100 mg total) by mouth 2 (two) times daily. 180 tablet 0   triamcinolone cream (KENALOG) 0.1 % Apply 1 application topically 2 (two) times daily. Use as needed for itchy rash 30 g 1   No facility-administered medications prior to visit.    Allergies  Allergen Reactions   Morphine Other (See Comments)    Hallucinations   Shellfish-Derived Products Itching    Review of Systems  Constitutional:  Negative for chills and fever.  Respiratory:  Negative for shortness of breath.   Cardiovascular:  Negative for chest pain and palpitations.   Gastrointestinal:  Negative for abdominal pain, blood in stool, constipation, diarrhea, nausea and vomiting.  Genitourinary:  Negative for dysuria, frequency, hematuria and urgency.  Skin:           Neurological:  Negative for headaches.       Objective:    Physical Exam Constitutional:      General: He is not in acute distress.    Appearance: Normal appearance. He is normal weight. He is not ill-appearing.  HENT:     Head: Normocephalic and atraumatic.     Right Ear: External ear normal.     Left Ear: External ear normal.     Nose: Nose normal.     Mouth/Throat:     Mouth: Mucous membranes are moist.     Pharynx: Oropharynx is clear.  Eyes:     General:        Right eye: No discharge.        Left eye: No discharge.     Extraocular Movements: Extraocular movements intact.     Conjunctiva/sclera: Conjunctivae normal.     Pupils: Pupils are equal, round, and reactive to light.  Cardiovascular:     Rate and Rhythm: Normal rate and regular rhythm.     Pulses: Normal pulses.     Heart sounds: Normal heart sounds. No murmur heard.    No gallop.  Pulmonary:     Effort: Pulmonary effort is normal. No respiratory distress.     Breath sounds: Normal breath sounds. No wheezing or rales.  Abdominal:     General: Bowel sounds are normal.     Palpations: Abdomen is soft.     Tenderness: There is no abdominal tenderness. There is no guarding.  Musculoskeletal:        General: Normal range of motion.     Cervical back: Normal range of motion.     Right lower leg: No edema.     Left lower leg: No edema.  Skin:    General: Skin is warm and dry.  Neurological:     Mental Status: He is alert and oriented to person, place, and time.  Psychiatric:        Mood and Affect: Mood normal.        Behavior: Behavior normal.        Judgment: Judgment normal.     There were no vitals taken for this visit. Wt Readings from Last 3 Encounters:  01/27/23  176 lb (79.8 kg)  12/21/22 176 lb  9.6 oz (80.1 kg)  09/21/22 179 lb (81.2 kg)    Diabetic Foot Exam - Simple   No data filed    Lab Results  Component Value Date   WBC 9.9 01/05/2023   HGB 13.7 01/05/2023   HCT 40.3 01/05/2023   PLT 170 01/05/2023   GLUCOSE 129 (H) 01/05/2023   CHOL 95 11/12/2022   TRIG 81.0 11/12/2022   HDL 41.00 11/12/2022   LDLDIRECT 77.0 08/20/2019   LDLCALC 37 11/12/2022   ALT 31 11/12/2022   AST 27 11/12/2022   NA 137 01/05/2023   K 4.1 01/05/2023   CL 104 01/05/2023   CREATININE 1.60 (H) 01/05/2023   BUN 26 (H) 01/05/2023   CO2 25 01/05/2023   TSH 13.42 (H) 11/12/2022   PSA 3.14 08/20/2019   INR 1.2 06/16/2022   HGBA1C 8.6 (H) 11/12/2022   MICROALBUR <0.7 05/14/2019    Lab Results  Component Value Date   TSH 13.42 (H) 11/12/2022   Lab Results  Component Value Date   WBC 9.9 01/05/2023   HGB 13.7 01/05/2023   HCT 40.3 01/05/2023   MCV 90.2 01/05/2023   PLT 170 01/05/2023   Lab Results  Component Value Date   NA 137 01/05/2023   K 4.1 01/05/2023   CO2 25 01/05/2023   GLUCOSE 129 (H) 01/05/2023   BUN 26 (H) 01/05/2023   CREATININE 1.60 (H) 01/05/2023   BILITOT 0.6 11/12/2022   ALKPHOS 95 11/12/2022   AST 27 11/12/2022   ALT 31 11/12/2022   PROT 6.7 11/12/2022   ALBUMIN 4.3 11/12/2022   CALCIUM 8.9 01/05/2023   ANIONGAP 8 01/05/2023   GFR 51.39 (L) 11/12/2022   Lab Results  Component Value Date   CHOL 95 11/12/2022   Lab Results  Component Value Date   HDL 41.00 11/12/2022   Lab Results  Component Value Date   LDLCALC 37 11/12/2022   Lab Results  Component Value Date   TRIG 81.0 11/12/2022   Lab Results  Component Value Date   CHOLHDL 2 11/12/2022   Lab Results  Component Value Date   HGBA1C 8.6 (H) 11/12/2022      Assessment & Plan:   Problem List Items Addressed This Visit       Cardiovascular and Mediastinum   Essential hypertension   Chronic diastolic CHF (congestive heart failure) ([place]) - Primary     Endocrine   Hypothyroidism  due to acquired atrophy of thyroid     Other   Hyperlipidemia LDL goal <70   Vitamin D deficiency   No orders of the defined types were placed in this encounter.  I, [name], personally preformed the services described in this documentation.  All medical record entries made by the scribe were at my direction and in my presence.  I have reviewed the chart and discharge instructions (if applicable) and agree that the record reflects my personal performance and is accurate and complete. 02/24/2023  I,Mohammed Iqbal,acting as a scribe for Penni Homans, MD.,have documented all relevant documentation on the behalf of Penni Homans, MD,as directed by  Penni Homans, MD while in the presence of Penni Homans, MD.  [name]

## 2023-02-28 DIAGNOSIS — Z794 Long term (current) use of insulin: Secondary | ICD-10-CM | POA: Diagnosis not present

## 2023-02-28 DIAGNOSIS — I2510 Atherosclerotic heart disease of native coronary artery without angina pectoris: Secondary | ICD-10-CM | POA: Diagnosis not present

## 2023-02-28 DIAGNOSIS — H9190 Unspecified hearing loss, unspecified ear: Secondary | ICD-10-CM | POA: Diagnosis not present

## 2023-02-28 DIAGNOSIS — I4510 Unspecified right bundle-branch block: Secondary | ICD-10-CM | POA: Diagnosis not present

## 2023-02-28 DIAGNOSIS — K219 Gastro-esophageal reflux disease without esophagitis: Secondary | ICD-10-CM | POA: Diagnosis not present

## 2023-02-28 DIAGNOSIS — I129 Hypertensive chronic kidney disease with stage 1 through stage 4 chronic kidney disease, or unspecified chronic kidney disease: Secondary | ICD-10-CM | POA: Diagnosis not present

## 2023-02-28 DIAGNOSIS — F0283 Dementia in other diseases classified elsewhere, unspecified severity, with mood disturbance: Secondary | ICD-10-CM | POA: Diagnosis not present

## 2023-02-28 DIAGNOSIS — M47812 Spondylosis without myelopathy or radiculopathy, cervical region: Secondary | ICD-10-CM | POA: Diagnosis not present

## 2023-02-28 DIAGNOSIS — N183 Chronic kidney disease, stage 3 unspecified: Secondary | ICD-10-CM | POA: Diagnosis not present

## 2023-02-28 DIAGNOSIS — G4733 Obstructive sleep apnea (adult) (pediatric): Secondary | ICD-10-CM | POA: Diagnosis not present

## 2023-02-28 DIAGNOSIS — D631 Anemia in chronic kidney disease: Secondary | ICD-10-CM | POA: Diagnosis not present

## 2023-02-28 DIAGNOSIS — E039 Hypothyroidism, unspecified: Secondary | ICD-10-CM | POA: Diagnosis not present

## 2023-02-28 DIAGNOSIS — F32A Depression, unspecified: Secondary | ICD-10-CM | POA: Diagnosis not present

## 2023-02-28 DIAGNOSIS — E559 Vitamin D deficiency, unspecified: Secondary | ICD-10-CM | POA: Diagnosis not present

## 2023-02-28 DIAGNOSIS — G2581 Restless legs syndrome: Secondary | ICD-10-CM | POA: Diagnosis not present

## 2023-02-28 DIAGNOSIS — F0284 Dementia in other diseases classified elsewhere, unspecified severity, with anxiety: Secondary | ICD-10-CM | POA: Diagnosis not present

## 2023-02-28 DIAGNOSIS — I471 Supraventricular tachycardia, unspecified: Secondary | ICD-10-CM | POA: Diagnosis not present

## 2023-02-28 DIAGNOSIS — Z7982 Long term (current) use of aspirin: Secondary | ICD-10-CM | POA: Diagnosis not present

## 2023-02-28 DIAGNOSIS — Z7901 Long term (current) use of anticoagulants: Secondary | ICD-10-CM | POA: Diagnosis not present

## 2023-02-28 DIAGNOSIS — I480 Paroxysmal atrial fibrillation: Secondary | ICD-10-CM | POA: Diagnosis not present

## 2023-02-28 DIAGNOSIS — E785 Hyperlipidemia, unspecified: Secondary | ICD-10-CM | POA: Diagnosis not present

## 2023-02-28 DIAGNOSIS — D509 Iron deficiency anemia, unspecified: Secondary | ICD-10-CM | POA: Diagnosis not present

## 2023-02-28 DIAGNOSIS — E1122 Type 2 diabetes mellitus with diabetic chronic kidney disease: Secondary | ICD-10-CM | POA: Diagnosis not present

## 2023-03-02 ENCOUNTER — Other Ambulatory Visit (HOSPITAL_BASED_OUTPATIENT_CLINIC_OR_DEPARTMENT_OTHER): Payer: Self-pay

## 2023-03-02 ENCOUNTER — Telehealth: Payer: Self-pay | Admitting: Internal Medicine

## 2023-03-02 ENCOUNTER — Ambulatory Visit: Payer: Medicare Other | Admitting: Neurology

## 2023-03-02 ENCOUNTER — Other Ambulatory Visit: Payer: Self-pay

## 2023-03-02 DIAGNOSIS — Z7901 Long term (current) use of anticoagulants: Secondary | ICD-10-CM | POA: Diagnosis not present

## 2023-03-02 DIAGNOSIS — Z7982 Long term (current) use of aspirin: Secondary | ICD-10-CM | POA: Diagnosis not present

## 2023-03-02 DIAGNOSIS — K219 Gastro-esophageal reflux disease without esophagitis: Secondary | ICD-10-CM | POA: Diagnosis not present

## 2023-03-02 DIAGNOSIS — D509 Iron deficiency anemia, unspecified: Secondary | ICD-10-CM | POA: Diagnosis not present

## 2023-03-02 DIAGNOSIS — N183 Chronic kidney disease, stage 3 unspecified: Secondary | ICD-10-CM | POA: Diagnosis not present

## 2023-03-02 DIAGNOSIS — I129 Hypertensive chronic kidney disease with stage 1 through stage 4 chronic kidney disease, or unspecified chronic kidney disease: Secondary | ICD-10-CM | POA: Diagnosis not present

## 2023-03-02 DIAGNOSIS — E1165 Type 2 diabetes mellitus with hyperglycemia: Secondary | ICD-10-CM

## 2023-03-02 DIAGNOSIS — I4510 Unspecified right bundle-branch block: Secondary | ICD-10-CM | POA: Diagnosis not present

## 2023-03-02 DIAGNOSIS — I471 Supraventricular tachycardia, unspecified: Secondary | ICD-10-CM | POA: Diagnosis not present

## 2023-03-02 DIAGNOSIS — E1122 Type 2 diabetes mellitus with diabetic chronic kidney disease: Secondary | ICD-10-CM | POA: Diagnosis not present

## 2023-03-02 DIAGNOSIS — G2581 Restless legs syndrome: Secondary | ICD-10-CM | POA: Diagnosis not present

## 2023-03-02 DIAGNOSIS — Z794 Long term (current) use of insulin: Secondary | ICD-10-CM | POA: Diagnosis not present

## 2023-03-02 DIAGNOSIS — H9190 Unspecified hearing loss, unspecified ear: Secondary | ICD-10-CM | POA: Diagnosis not present

## 2023-03-02 DIAGNOSIS — F0283 Dementia in other diseases classified elsewhere, unspecified severity, with mood disturbance: Secondary | ICD-10-CM | POA: Diagnosis not present

## 2023-03-02 DIAGNOSIS — E039 Hypothyroidism, unspecified: Secondary | ICD-10-CM | POA: Diagnosis not present

## 2023-03-02 DIAGNOSIS — F32A Depression, unspecified: Secondary | ICD-10-CM | POA: Diagnosis not present

## 2023-03-02 DIAGNOSIS — F0284 Dementia in other diseases classified elsewhere, unspecified severity, with anxiety: Secondary | ICD-10-CM | POA: Diagnosis not present

## 2023-03-02 DIAGNOSIS — I2510 Atherosclerotic heart disease of native coronary artery without angina pectoris: Secondary | ICD-10-CM | POA: Diagnosis not present

## 2023-03-02 DIAGNOSIS — E559 Vitamin D deficiency, unspecified: Secondary | ICD-10-CM | POA: Diagnosis not present

## 2023-03-02 DIAGNOSIS — D631 Anemia in chronic kidney disease: Secondary | ICD-10-CM | POA: Diagnosis not present

## 2023-03-02 DIAGNOSIS — G4733 Obstructive sleep apnea (adult) (pediatric): Secondary | ICD-10-CM | POA: Diagnosis not present

## 2023-03-02 DIAGNOSIS — M47812 Spondylosis without myelopathy or radiculopathy, cervical region: Secondary | ICD-10-CM | POA: Diagnosis not present

## 2023-03-02 DIAGNOSIS — E785 Hyperlipidemia, unspecified: Secondary | ICD-10-CM | POA: Diagnosis not present

## 2023-03-02 DIAGNOSIS — I480 Paroxysmal atrial fibrillation: Secondary | ICD-10-CM | POA: Diagnosis not present

## 2023-03-02 MED ORDER — FREESTYLE LIBRE 2 SENSOR MISC
1.0000 | 0 refills | Status: DC
  Filled 2023-03-02: qty 2, 28d supply, fill #0

## 2023-03-02 NOTE — Telephone Encounter (Signed)
done

## 2023-03-02 NOTE — Telephone Encounter (Signed)
MEDICATION:  FreeStyle Libre 2 Sensor Continuous Blood Gluc Sensor (FREESTYLE LIBRE 2 SENSOR) MISC  PHARMACY:  [facility] (Ph: [phone])   HAS THE PATIENT CONTACTED THEIR PHARMACY?  Yes  IS THIS A 90 DAY SUPPLY : Yes  IS PATIENT OUT OF MEDICATION: Yes  IF NOT; HOW MUCH IS LEFT:   LAST APPOINTMENT DATE: @6 /27/2023  NEXT APPOINTMENT DATE:@3 /28/2024  DO WE HAVE YOUR PERMISSION TO LEAVE A DETAILED MESSAGE?: Yes  OTHER COMMENTS: Patient's last sensor fell off in the shower today.   **Let patient know to contact pharmacy at the end of the day to make sure medication is ready. **  ** Please notify patient to allow 48-72 hours to process**  **Encourage patient to contact the pharmacy for refills or they can request refills through [facility]**

## 2023-03-03 ENCOUNTER — Ambulatory Visit (INDEPENDENT_AMBULATORY_CARE_PROVIDER_SITE_OTHER): Payer: Medicare Other | Admitting: Internal Medicine

## 2023-03-03 ENCOUNTER — Other Ambulatory Visit (HOSPITAL_BASED_OUTPATIENT_CLINIC_OR_DEPARTMENT_OTHER): Payer: Self-pay

## 2023-03-03 ENCOUNTER — Encounter: Payer: Self-pay | Admitting: Internal Medicine

## 2023-03-03 VITALS — BP 126/84 | HR 62 | Ht 68.0 in | Wt 173.0 lb

## 2023-03-03 DIAGNOSIS — E1142 Type 2 diabetes mellitus with diabetic polyneuropathy: Secondary | ICD-10-CM

## 2023-03-03 DIAGNOSIS — E1159 Type 2 diabetes mellitus with other circulatory complications: Secondary | ICD-10-CM

## 2023-03-03 DIAGNOSIS — E1165 Type 2 diabetes mellitus with hyperglycemia: Secondary | ICD-10-CM | POA: Diagnosis not present

## 2023-03-03 DIAGNOSIS — N1831 Chronic kidney disease, stage 3a: Secondary | ICD-10-CM

## 2023-03-03 DIAGNOSIS — Z794 Long term (current) use of insulin: Secondary | ICD-10-CM

## 2023-03-03 DIAGNOSIS — E1122 Type 2 diabetes mellitus with diabetic chronic kidney disease: Secondary | ICD-10-CM | POA: Diagnosis not present

## 2023-03-03 LAB — POCT GLYCOSYLATED HEMOGLOBIN (HGB A1C): Hemoglobin A1C: 9.3 % — AB (ref 4.0–5.6)

## 2023-03-03 LAB — POCT GLUCOSE (DEVICE FOR HOME USE): Glucose Fasting, POC: 148 mg/dL — AB (ref 70–99)

## 2023-03-03 MED ORDER — FREESTYLE LIBRE 2 SENSOR MISC
1.0000 | 3 refills | Status: DC
  Filled 2023-03-03: qty 6, 84d supply, fill #0
  Filled 2023-04-29: qty 1, 14d supply, fill #1
  Filled 2023-05-05: qty 6, 84d supply, fill #1

## 2023-03-03 MED ORDER — TOUJEO MAX SOLOSTAR 300 UNIT/ML ~~LOC~~ SOPN
60.0000 [IU] | PEN_INJECTOR | Freq: Every day | SUBCUTANEOUS | 2 refills | Status: DC
  Filled 2023-03-03: qty 30, 150d supply, fill #0
  Filled 2023-04-07: qty 6, 30d supply, fill #0
  Filled 2023-05-02: qty 6, 30d supply, fill #1
  Filled 2023-05-30: qty 6, 30d supply, fill #2
  Filled 2023-06-27: qty 6, 30d supply, fill #3
  Filled 2023-07-27: qty 6, 30d supply, fill #4
  Filled 2023-08-26: qty 6, 30d supply, fill #5
  Filled 2023-10-03: qty 6, 30d supply, fill #6
  Filled 2023-10-31: qty 6, 30d supply, fill #7
  Filled 2023-12-05: qty 6, 30d supply, fill #8
  Filled 2023-12-28 – 2024-01-05 (×4): qty 6, 30d supply, fill #9
  Filled 2024-01-18 – 2024-01-31 (×2): qty 6, 30d supply, fill #10
  Filled 2024-02-09 – 2024-02-27 (×2): qty 6, 30d supply, fill #11

## 2023-03-03 MED ORDER — DAPAGLIFLOZIN PROPANEDIOL 5 MG PO TABS
5.0000 mg | ORAL_TABLET | Freq: Every day | ORAL | 6 refills | Status: DC
  Filled 2023-03-03: qty 30, 30d supply, fill #0
  Filled 2023-04-07: qty 30, 30d supply, fill #1
  Filled 2023-05-02: qty 30, 30d supply, fill #2

## 2023-03-03 MED ORDER — INSULIN PEN NEEDLE 31G X 5 MM MISC
1.0000 | Freq: Four times a day (QID) | 3 refills | Status: DC
  Filled 2023-03-03: qty 400, 100d supply, fill #0
  Filled 2023-03-03: qty 400, 90d supply, fill #0
  Filled 2023-04-08: qty 400, 100d supply, fill #0

## 2023-03-03 MED ORDER — INSULIN LISPRO (1 UNIT DIAL) 100 UNIT/ML (KWIKPEN)
10.0000 [IU] | PEN_INJECTOR | Freq: Three times a day (TID) | SUBCUTANEOUS | 3 refills | Status: DC
  Filled 2023-03-03: qty 45, 150d supply, fill #0
  Filled 2023-04-07: qty 9, 30d supply, fill #0
  Filled 2023-05-02: qty 9, 30d supply, fill #1
  Filled 2023-05-30: qty 9, 30d supply, fill #2
  Filled 2023-06-27: qty 9, 30d supply, fill #3
  Filled 2023-07-27: qty 9, 30d supply, fill #4
  Filled 2023-08-26: qty 9, 30d supply, fill #5
  Filled 2023-10-03: qty 9, 30d supply, fill #6
  Filled 2023-10-31: qty 9, 30d supply, fill #7
  Filled 2023-12-05: qty 9, 30d supply, fill #8
  Filled 2023-12-28 – 2024-01-05 (×4): qty 9, 30d supply, fill #9
  Filled 2024-01-18: qty 9, 30d supply, fill #10
  Filled 2024-01-24: qty 6, 20d supply, fill #10
  Filled 2024-02-02: qty 27, 90d supply, fill #11
  Filled ????-??-?? (×2): fill #10

## 2023-03-03 NOTE — Patient Instructions (Addendum)
-   Start Farxiga 5 mg , 1 tablet every morning  - Decrease  Toujeo 60 units ONCE daily  - Continue  Humalog 12 units with each meal ( breakfast, lunch and supper) -Humalog correctional insulin: ADD extra units on insulin to your meal-time Humalog dose if your blood sugars are higher than 150. Use the scale below to help guide you:   Blood sugar before meal Number of units to inject  Less than 155 0 unit  156 - 180 1 units  181 -  205 2 units  206 -  230 3 units  231 -  255 4 units  256 -  280 5 units  281 -  305 6 units  306 -  330 7 units  331 -  355 8 units  356 - 380 9 units    HOW TO TREAT LOW BLOOD SUGARS (Blood sugar LESS THAN 70 MG/DL) Please follow the RULE OF 15 for the treatment of hypoglycemia treatment (when your (blood sugars are less than 70 mg/dL)   STEP 1: Take 15 grams of carbohydrates when your blood sugar is low, which includes:  3-4 GLUCOSE TABS  OR 3-4 OZ OF JUICE OR REGULAR SODA OR ONE TUBE OF GLUCOSE GEL    STEP 2: RECHECK blood sugar in 15 MINUTES STEP 3: If your blood sugar is still low at the 15 minute recheck --> then, go back to STEP 1 and treat AGAIN with another 15 grams of carbohydrates.

## 2023-03-03 NOTE — Progress Notes (Signed)
Name: [name] [name]  Age/ Sex: 77 y.o., male   MRN/ DOB: [id number], 04-18-1946     PCP: [name], MD   Reason for Endocrinology Evaluation: Type 2 Diabetes Mellitus  Initial Endocrine Consultative Visit: 11/21/2017    PATIENT IDENTIFIER: [name] [name] is a 77 y.o. male with a past medical history of T2DM, HTN, CAD,Hypothyroidism CHF and OSA. The patient has followed with Endocrinology clinic since 11/21/2017 for consultative assistance with management of his diabetes.   On his initia visit with me 04/2022 he was on NPH only with an A1c 8.7 % . He declined SGLT-2i and opted for prandial insulin . We restarted toujeo and stopped NPH  DIABETIC HISTORY:  [name] [name] was diagnosed with DM in 1989.He was on Metformin at some point.  His hemoglobin A1c has ranged from 6.4% in 2022, peaking at 8.7% in 2023.   SUBJECTIVE:   During the last visit (06/01/2022): A1c  9.3%  Today (03/03/2023): [name] [name] is here for follow-up on diabetes management. He is accompanied by his daughter today . He checks his blood sugars multiple  times daily through CGM. The patient has had hypoglycemic episodes since the last clinic visit, but he did not recall this .    He was seen by neurology 01/27/2023 for mild cognitive impairment, mild dementia and gait instability He presented to the ED 01/05/2023 for stable angina Denies nausea, vomiting or diarrhea     HOME DIABETES REGIMEN:  Toujeo 65 units daily  Humalog 10 units TID QAC Correction Factor : HUmalog (BG-130/25)      Statin: yes ACE-I/ARB: no      CONTINUOUS GLUCOSE MONITORING RECORD INTERPRETATION      Dates of Recording: 3/14-3/27/2024  Sensor description: [employer] 2  Results statistics:   CGM use % of time 90  Average and SD 237/47.2  Time in range    32    %  % Time Above 180 16  % Time above 250 46  % Time Below target 5   Glycemic patterns summary: BG's trend down overnight but increased during  the day  Hyperglycemic episodes all day  Hypoglycemic episodes occurred overnight  Overnight periods: Trends down       DIABETIC COMPLICATIONS: Microvascular complications:  Neuropathy, CKD III Denies:  Last Eye Exam: Completed 10/22/2022  Macrovascular complications:  CAD, PVD Denies: CVA   HISTORY:  Past Medical History:  Past Medical History:  Diagnosis Date   Acquired atrophy of thyroid 06/05/2013   Overview:  July 2014: controlled on synthroid 60mcg since 2012 May 2015: decreased to Synthroid 50 mcg  Last Assessment & Plan:  Pt doing well with most recent TSH within normal range.  Will continue current dosage of Synthroid 40mcg. Pt reminded to take medication on empty stomach. Will continue to monitor with periodic laboratory assessment in 3 months.    Anemia    hemoglobin 7.4, iron deficiency, January, 2011, 2 unit transfusion, endoscopy normal, capsule endoscopy February, 2011 no small bowel abnormalities.   Most likely source gastric erosions, followed by GI   Anxiety    Bradycardia    CAD (coronary artery disease)    A. CABG in 2000,status post cardiac cath in 2006, 2009 ....continued chest pain and SOB despite oral medication adjestments including Ranexa. B. Cath November 2009/ mRCA - 2.75 x 23 Abbott Xience V drug-eluting stent ...11/26/2008 to distal  RCA leading to acute marginal.  C. Cath 07/2012 for CP - stable anatomy, med rx. d.  cath 2015 and 05/30/2015 stable anatomy, consider Myoview if has CP again   Carotid artery disease ([place]) 08/01/2015   Doppler, May 29, 2015, 1-39% bilateral ICA    Cerebral ischemia    MRI November, 2010, chronic microvascular ischemia   CKD (chronic kidney disease), stage III ([place])    Concussion    Depression    Bipolar   Diabetes mellitus ([place])    Edema    Essential hypertension 06/05/2013   Overview:  July 2014: Controlled with Bystolic Sep 123456: Changed to atenolol.  Last Assessment & Plan:  Will change to atenolol for cost  and follow Improved.  Medication compliance strongly encouraged BP: 116/64 mmHg    Overview:  July 2014: Controlled with Bystolic Sep 123456: Changed to atenolol.  Last Assessment & Plan:  Will change to atenolol for cost and follow   Falling episodes    these have occurred in the past and again recurring 2011   Family history of adverse reaction to anesthesia    "mother died during bypass surgery but not sure if it has to do with anesthesia"   Gastric ulcer    GERD (gastroesophageal reflux disease)    H/O medication noncompliance    Due to loss of insurance   H/O multiple concussions    Hard of hearing    Heart murmur    History of blood transfusion 12/20/2013   History of kidney stones    Hx of CABG    2000,  / one median sternotomy suture broken her chest x-ray November, 2010, no clinical significance   Hyperlipidemia    Hypertension    pt. denies   Iron deficiency anemia    Long term (current) use of anticoagulants [Z79.01] 07/20/2016   Low back pain 06/12/2009   Qualifier: Diagnosis of  By: [name]    Nephrolithiasis    Orthostasis    OSA (obstructive sleep apnea)    PAF (paroxysmal atrial fibrillation) ([place])    a. dx 2017, started on amiodarone/warfarin but patient intermittently noncompliant.   PSVT (paroxysmal supraventricular tachycardia)    RBBB 07/09/2009   Qualifier: Diagnosis of  By: [name], MD, [facility], [name]    RLS (restless legs syndrome) 09/19/2009   Qualifier: Diagnosis of  By: [name]   Overview:  July 2014: Controlled with Mirapex  Last Assessment & Plan:  Patient is doing well. Will continue current management and follow clinically.   Seizure disorder ([place]) 01/09/2017   Spondylosis    C5-6, C6-7 MRI 2010   Syncope 03/2016   TBI (traumatic brain injury) ([place]) 2019   Thyroid disease    Tubulovillous adenoma of colon 2007   Vitamin D deficiency 05/17/2017   Wears glasses    Past Surgical History:  Past Surgical History:  Procedure  Laterality Date   ANTERIOR CERVICAL DECOMP/DISCECTOMY FUSION N/A 05/31/2016   Procedure: ANTERIOR CERVICAL DECOMPRESSION/DISCECTOMY FUSION CERVICAL FIVE-SIX,CERVICAL SIX-SEVEN;  Surgeon: [name], MD;  Location: [facility];  Service: Neurosurgery;  Laterality: N/A;   CARDIAC CATHETERIZATION N/A 05/30/2015   Procedure: Left Heart Cath and Coronary Angiography;  Surgeon: [name], MD;  Location: [facility];  Service: Cardiovascular;  Laterality: N/A;   CATARACT EXTRACTION     COLONOSCOPY     CORONARY ARTERY BYPASS GRAFT     2000   ESOPHAGOGASTRODUODENOSCOPY     LEFT HEART CATH AND CORS/GRAFTS ANGIOGRAPHY N/A 12/15/2017   Procedure: LEFT HEART CATH AND CORS/GRAFTS ANGIOGRAPHY;  Surgeon: Larae Grooms  S, MD;  Location: [facility];  Service: Cardiovascular;  Laterality: N/A;   LEFT HEART CATHETERIZATION WITH CORONARY/GRAFT ANGIOGRAM N/A 08/01/2012   Procedure: LEFT HEART CATHETERIZATION WITH Beatrix Fetters;  Surgeon: [name], MD;  Location[facility] CATH [facility];  Service: Cardiovascular;  Laterality: N/A;   LEFT HEART CATHETERIZATION WITH CORONARY/GRAFT ANGIOGRAM N/A 01/03/2015   Procedure: LEFT HEART CATHETERIZATION WITH Beatrix Fetters;  Surgeon: [name], MD;  Location: [facility]ATH LAB;  Service: Cardiovascular;  Laterality: N/A;   NASAL SEPTUM SURGERY     UP3   PERCUTANEOUS CORONARY STENT INTERVENTION (PCI-S)  10/06/2008   mRCA PCI  2.75 x 23 Abbott Xience V drug-eluting stent    RIGHT/LEFT HEART CATH AND CORONARY/GRAFT ANGIOGRAPHY N/A 01/07/2020   Procedure: RIGHT/LEFT HEART CATH AND CORONARY/GRAFT ANGIOGRAPHY;  Surgeon: [name], MD;  Location: [facility];  Service: Cardiovascular;  Laterality: N/A;   TOTAL KNEE ARTHROPLASTY Right 09/22/2021   Procedure: RIGHT TOTAL KNEE ARTHROPLASTY;  Surgeon: [name], MD;  Location: [facility];  Service: Orthopedics;  Laterality: Right;   ULTRASOUND GUIDANCE FOR VASCULAR ACCESS   12/15/2017   Procedure: Ultrasound Guidance For Vascular Access;  Surgeon: [name], MD;  Location: [facility];  Service: Cardiovascular;;   Social History:  reports that he has never smoked. He has never been exposed to tobacco smoke. He has never used smokeless tobacco. He reports that he does not drink alcohol and does not use drugs. Family History:  Family History  Problem Relation Age of Onset   Pancreatic cancer Brother    Diabetes Brother    Coronary artery disease Brother    Stroke Brother    Diabetes Brother    Diabetes Mother    Heart failure Mother    Heart failure Father    Hypothyroidism Brother    Coronary artery disease Brother    Other Brother        colon surgery   Heart attack Other        Nephew   Irregular heart beat Daughter    Cancer Maternal Grandmother        unknown    Colon cancer Neg Hx    Stomach cancer Neg Hx    Liver cancer Neg Hx    Rectal cancer Neg Hx    Esophageal cancer Neg Hx      HOME MEDICATIONS: Allergies as of 03/03/2023       Reactions   Morphine Other (See Comments)   Hallucinations   Shellfish-derived Products Itching        Medication List        Accurate as of March 03, 2023  9:38 AM. If you have any questions, ask your nurse or doctor.          STOP taking these medications    amoxicillin 500 MG capsule Commonly known as: AMOXIL Stopped by: Dorita Sciara, MD   prednisoLONE acetate 1 % ophthalmic suspension Commonly known as: PRED FORTE Stopped by: Dorita Sciara, MD       TAKE these medications    amiodarone 200 MG tablet Commonly known as: PACERONE Take 1 tablet (200 mg total) by mouth daily.   amitriptyline 10 MG tablet Commonly known as: ELAVIL Take 1-3 tablets (10-30 mg total) by mouth at bedtime.   aspirin EC 81 MG tablet Take 81 mg by mouth daily. Swallow whole.   atorvastatin 80 MG tablet Commonly known as: LIPITOR Take 1 tablet (80 mg  total) by mouth  daily.   Besivance 0.6 % Susp Generic drug: Besifloxacin HCl Instill 1 drop into right eye three times a day as directed. Please store upside down!!   Besivance 0.6 % Susp Generic drug: Besifloxacin HCl Instill 1 drop into left eye three times a day as directed   Comirnaty syringe Generic drug: COVID-19 mRNA vaccine 2023-2024 Inject into the muscle.   diclofenac 75 MG EC tablet Commonly known as: VOLTAREN Take 1 tablet (75 mg total) by mouth 2 (two) times daily.   divalproex 500 MG DR tablet Commonly known as: Depakote Take 1 tablet (500 mg total) by mouth 2 (two) times daily.   donepezil 10 MG tablet Commonly known as: ARICEPT Take 1 tablet (10 mg total) by mouth at bedtime.   Eliquis 5 MG Tabs tablet Generic drug: apixaban Take 1 tablet (5 mg total) by mouth 2 (two) times daily.   fenofibrate 160 MG tablet Take 1 tablet (160 mg total) by mouth daily.   Fluad Quadrivalent 0.5 ML injection Generic drug: influenza vaccine adjuvanted Inject into the muscle.   FreeStyle Libre 2 Sensor Misc Use as instructed to check blood sugar, change every 14 days *No further refills until appt is scheduled*   glucose blood test strip Commonly known as: Accu-Chek Aviva Use as directed to check blood sugar 4 times daily.  DX E11.9   HumaLOG KwikPen 100 UNIT/ML KwikPen Generic drug: insulin lispro Inject 10 Units into the skin 3 (three) times daily.   hydrocortisone 2.5 % cream Apply on to the skin 2 (two) times daily.   hydrOXYzine 10 MG tablet Commonly known as: ATARAX Take 1 tablet (10 mg total) by mouth 3 (three) times daily as needed for anxiety or itching.   isosorbide mononitrate 120 MG 24 hr tablet Commonly known as: IMDUR Take 1 tablet (120 mg total) by mouth daily.   ketorolac 0.5 % ophthalmic solution Commonly known as: ACULAR Place 1 drop into the right eye 4 times a day as directed   ketorolac 0.5 % ophthalmic solution Commonly known as: ACULAR Place 1 drop  into the left eye 4 (four) times daily.   levocetirizine 5 MG tablet Commonly known as: XYZAL Take 1 tablet (5 mg total) by mouth every evening.   levothyroxine 100 MCG tablet Commonly known as: SYNTHROID TAKE 1 TABLET (100 MCG TOTAL) BY MOUTH DAILY.   meclizine 25 MG tablet Commonly known as: ANTIVERT Take 1 tablet (25 mg total) by mouth 3 (three) times daily as needed for dizziness.   memantine 10 MG tablet Commonly known as: Namenda Take 1 tablet (10 mg total) by mouth 2 (two) times daily.   nitroGLYCERIN 0.4 MG SL tablet Commonly known as: NITROSTAT PLACE 1 TABLET UNDER THE TONGUE EVERY 5 MINUTES AS NEEDED FOR CHEST PAIN.   pantoprazole 40 MG tablet Commonly known as: PROTONIX Take 1 tablet (40 mg total) by mouth daily.   Pennsaid 2 % Soln Generic drug: diclofenac Sodium Place 1 application onto the skin 2 (two) times daily.   Pentips 31G X 5 MM Misc Generic drug: Insulin Pen Needle Use as directed to inject insulin in the morning, at noon, in the evening, and at bedtime.   pramipexole 1 MG tablet Commonly known as: Mirapex Take 1/2 to 1 tablet (0.5-1 mg total) by mouth at bedtime.   QUEtiapine 25 MG tablet Commonly known as: SEROquel Take 1 tablet (25 mg total) by mouth at bedtime.   ranolazine 500 MG 12 hr tablet Commonly  known as: RANEXA TAKE 1 TABLET BY MOUTH TWICE DAILY   sertraline 100 MG tablet Commonly known as: ZOLOFT Take 1 tablet (100 mg total) by mouth 2 (two) times daily.   Toujeo Max SoloStar 300 UNIT/ML Solostar Pen Generic drug: insulin glargine (2 Unit Dial) Inject 65 Units into the skin daily in the afternoon.   triamcinolone cream 0.1 % Commonly known as: KENALOG Apply 1 application topically 2 (two) times daily. Use as needed for itchy rash         OBJECTIVE:   Vital Signs: BP 126/84 (BP Location: Left Arm, Patient Position: Sitting, Cuff Size: Small)   Pulse 62   Ht 5\' 8"  (1.727 m)   Wt 173 lb (78.5 kg)   SpO2 97%   BMI  26.30 kg/m   Wt Readings from Last 3 Encounters:  03/03/23 173 lb (78.5 kg)  01/27/23 176 lb (79.8 kg)  12/21/22 176 lb 9.6 oz (80.1 kg)     Exam: General: Pt appears well and is in NAD  Lungs: Clear with good BS bilat  Heart: RRR   Abdomen:  soft, nontender  Extremities: Trace  pretibial edema.   Neuro: MS is good with appropriate affect, pt is alert and Ox3     DM foot exam 03/03/2023  The skin of the feet is intact without sores or ulcerations. The pedal pulses are 2+ on right and 2+ on left. The sensation is decreased   to a screening 5.07, 10 gram monofilament bilaterally      DATA REVIEWED:  Lab Results  Component Value Date   HGBA1C 8.6 (H) 11/12/2022   HGBA1C 9.4 (A) 06/01/2022   HGBA1C 8.7 (A) 03/22/2022    Latest Reference Range & Units 02/14/22 00:04  Sodium 135 - 145 mmol/L 140  Potassium 3.5 - 5.1 mmol/L 3.8  Chloride 98 - 111 mmol/L 109  CO2 22 - 32 mmol/L 25  Glucose 70 - 99 mg/dL 46 (L)  BUN 8 - 23 mg/dL 14  Creatinine 0.61 - 1.24 mg/dL 1.55 (H)  Calcium 8.9 - 10.3 mg/dL 9.5  Anion gap 5 - 15  6  GFR, Estimated >60 mL/min 47 (L)     ASSESSMENT / PLAN / RECOMMENDATIONS:   1) Type 2 Diabetes Mellitus, poorly controlled, With neuropathic, CKD III and macrovascular complications - Most recent A1c of 9.3 %. Goal A1c <7.0%.    -Patient continues with poorly controlled diabetes -In reviewing his CGM data, the patient has been noted with inconsistent prandial intake -Barriers to diabetes self-care is cognitive impairment. -I have recommended SGLT2 inhibitors, discussed cardiovascular and renal benefits -Due to hypoglycemia on CGM download, I will reduce his basal insulin as below  MEDICATIONS: Start Farxiga 5 mg daily Decrease Toujeo 60 units ONCE daily  Continue Humalog 12 units TIDQAC Continue correction Factor : HUmalog (BG -130/25)   EDUCATION / INSTRUCTIONS: BG monitoring instructions: Patient is instructed to check his blood sugars 3  times a day, before meals . Call [facility] if: BG persistently < 70  I reviewed the Rule of 15 for the treatment of hypoglycemia in detail with the patient. Literature supplied.   2) Diabetic complications:  Eye: Does not have known diabetic retinopathy.  Neuro/ Feet: Does  have known diabetic peripheral neuropathy .  Renal: Patient does  have known baseline CKD. He   is not on an ACEI/ARB at present.      F/U in 3 months     Signed electronically by: Elenora Gamma  Kelton Pillar, MD  [facility]  [facility] [address] [place], [place] [address] Phone: [phone] FAX: [phone]   CC: [name], [address] [place] [address] Phone: [phone]  Fax: [phone]  Return to Endocrinology clinic as below: Future Appointments  Date Time Provider [place]  03/15/2023  8:45 AM Alric Ran, MD [facility] None  04/13/2023 10:30 AM Marylu Lund., NP [facility]  02/02/2024 11:15 AM Alric Ran, MD [facility] None

## 2023-03-04 ENCOUNTER — Other Ambulatory Visit (HOSPITAL_BASED_OUTPATIENT_CLINIC_OR_DEPARTMENT_OTHER): Payer: Self-pay

## 2023-03-07 ENCOUNTER — Telehealth: Payer: Self-pay | Admitting: Neurology

## 2023-03-07 ENCOUNTER — Encounter: Payer: Self-pay | Admitting: Internal Medicine

## 2023-03-07 ENCOUNTER — Other Ambulatory Visit: Payer: Self-pay

## 2023-03-07 NOTE — Telephone Encounter (Signed)
Thank you, please advised them to follow up with PCP.

## 2023-03-07 NOTE — Telephone Encounter (Signed)
Chrys Racer called from [facility]. Stated pt blood pressure was elevated 162/83. Stated pt said he was stress out with the dog and haven't had a chance to take blood pressure medicine.

## 2023-03-07 NOTE — Telephone Encounter (Signed)
Called pt and daughter/caretaker picked up and was informed.

## 2023-03-08 ENCOUNTER — Other Ambulatory Visit: Payer: Self-pay | Admitting: Interventional Cardiology

## 2023-03-08 ENCOUNTER — Other Ambulatory Visit: Payer: Self-pay

## 2023-03-08 ENCOUNTER — Other Ambulatory Visit (HOSPITAL_BASED_OUTPATIENT_CLINIC_OR_DEPARTMENT_OTHER): Payer: Self-pay

## 2023-03-08 ENCOUNTER — Other Ambulatory Visit: Payer: Self-pay | Admitting: Family Medicine

## 2023-03-08 MED ORDER — ISOSORBIDE MONONITRATE ER 120 MG PO TB24
120.0000 mg | ORAL_TABLET | Freq: Every day | ORAL | 3 refills | Status: DC
  Filled 2023-03-08: qty 90, 90d supply, fill #0

## 2023-03-08 MED ORDER — SERTRALINE HCL 100 MG PO TABS
100.0000 mg | ORAL_TABLET | Freq: Two times a day (BID) | ORAL | 0 refills | Status: DC
  Filled 2023-03-08: qty 180, 90d supply, fill #0

## 2023-03-08 NOTE — Telephone Encounter (Signed)
Pt was called back and after reviewing DPR a message was left, stating pt needs to contact PCP about dizziness .

## 2023-03-08 NOTE — Telephone Encounter (Signed)
[name] [name] called back today and wanted to inform the provider that the pt informed her that the dizziness never really goes away and is always there.

## 2023-03-14 ENCOUNTER — Other Ambulatory Visit (HOSPITAL_BASED_OUTPATIENT_CLINIC_OR_DEPARTMENT_OTHER): Payer: Self-pay

## 2023-03-15 ENCOUNTER — Encounter: Payer: Self-pay | Admitting: Neurology

## 2023-03-15 ENCOUNTER — Ambulatory Visit: Payer: Medicare Other | Admitting: Neurology

## 2023-03-15 ENCOUNTER — Other Ambulatory Visit (HOSPITAL_BASED_OUTPATIENT_CLINIC_OR_DEPARTMENT_OTHER): Payer: Self-pay

## 2023-03-15 VITALS — BP 176/71 | HR 61 | Ht 68.0 in | Wt 170.5 lb

## 2023-03-15 DIAGNOSIS — G2581 Restless legs syndrome: Secondary | ICD-10-CM | POA: Diagnosis not present

## 2023-03-15 DIAGNOSIS — D631 Anemia in chronic kidney disease: Secondary | ICD-10-CM | POA: Diagnosis not present

## 2023-03-15 DIAGNOSIS — E559 Vitamin D deficiency, unspecified: Secondary | ICD-10-CM | POA: Diagnosis not present

## 2023-03-15 DIAGNOSIS — G3184 Mild cognitive impairment, so stated: Secondary | ICD-10-CM

## 2023-03-15 DIAGNOSIS — I4510 Unspecified right bundle-branch block: Secondary | ICD-10-CM | POA: Diagnosis not present

## 2023-03-15 DIAGNOSIS — I129 Hypertensive chronic kidney disease with stage 1 through stage 4 chronic kidney disease, or unspecified chronic kidney disease: Secondary | ICD-10-CM | POA: Diagnosis not present

## 2023-03-15 DIAGNOSIS — Z7982 Long term (current) use of aspirin: Secondary | ICD-10-CM | POA: Diagnosis not present

## 2023-03-15 DIAGNOSIS — F0284 Dementia in other diseases classified elsewhere, unspecified severity, with anxiety: Secondary | ICD-10-CM | POA: Diagnosis not present

## 2023-03-15 DIAGNOSIS — N183 Chronic kidney disease, stage 3 unspecified: Secondary | ICD-10-CM | POA: Diagnosis not present

## 2023-03-15 DIAGNOSIS — Z794 Long term (current) use of insulin: Secondary | ICD-10-CM | POA: Diagnosis not present

## 2023-03-15 DIAGNOSIS — M47812 Spondylosis without myelopathy or radiculopathy, cervical region: Secondary | ICD-10-CM | POA: Diagnosis not present

## 2023-03-15 DIAGNOSIS — Z7901 Long term (current) use of anticoagulants: Secondary | ICD-10-CM | POA: Diagnosis not present

## 2023-03-15 DIAGNOSIS — F32A Depression, unspecified: Secondary | ICD-10-CM | POA: Diagnosis not present

## 2023-03-15 DIAGNOSIS — I2510 Atherosclerotic heart disease of native coronary artery without angina pectoris: Secondary | ICD-10-CM | POA: Diagnosis not present

## 2023-03-15 DIAGNOSIS — I480 Paroxysmal atrial fibrillation: Secondary | ICD-10-CM | POA: Diagnosis not present

## 2023-03-15 DIAGNOSIS — E785 Hyperlipidemia, unspecified: Secondary | ICD-10-CM | POA: Diagnosis not present

## 2023-03-15 DIAGNOSIS — D509 Iron deficiency anemia, unspecified: Secondary | ICD-10-CM | POA: Diagnosis not present

## 2023-03-15 DIAGNOSIS — F0283 Dementia in other diseases classified elsewhere, unspecified severity, with mood disturbance: Secondary | ICD-10-CM | POA: Diagnosis not present

## 2023-03-15 DIAGNOSIS — H9190 Unspecified hearing loss, unspecified ear: Secondary | ICD-10-CM | POA: Diagnosis not present

## 2023-03-15 DIAGNOSIS — I471 Supraventricular tachycardia, unspecified: Secondary | ICD-10-CM | POA: Diagnosis not present

## 2023-03-15 DIAGNOSIS — E1122 Type 2 diabetes mellitus with diabetic chronic kidney disease: Secondary | ICD-10-CM | POA: Diagnosis not present

## 2023-03-15 DIAGNOSIS — G4733 Obstructive sleep apnea (adult) (pediatric): Secondary | ICD-10-CM | POA: Diagnosis not present

## 2023-03-15 DIAGNOSIS — E039 Hypothyroidism, unspecified: Secondary | ICD-10-CM | POA: Diagnosis not present

## 2023-03-15 DIAGNOSIS — K219 Gastro-esophageal reflux disease without esophagitis: Secondary | ICD-10-CM | POA: Diagnosis not present

## 2023-03-15 NOTE — Patient Instructions (Signed)
Decrease Aricept to half tablet nightly for 2 weeks due to concern of dizziness then increase to full tablet. Continue with Namenda 10 mg twice daily Continue your other medications Continue follow-up PCP Return as scheduled in a year or sooner if worse.   There are well-accepted and sensible ways to reduce risk for Alzheimers disease and other degenerative brain disorders .  Exercise Daily Walk A daily 20 minute walk should be part of your routine. Disease related apathy can be a significant roadblock to exercise and the only way to overcome this is to make it a daily routine and perhaps have a reward at the end (something your loved one loves to eat or drink perhaps) or a personal trainer coming to the home can also be very useful. Most importantly, the patient is much more likely to exercise if the caregiver / spouse does it with him/her. In general a structured, repetitive schedule is best.  General Health: Any diseases which effect your body will effect your brain such as a pneumonia, urinary infection, blood clot, heart attack or stroke. Keep contact with your primary care doctor for regular follow ups.  Sleep. A good nights sleep is healthy for the brain. Seven hours is recommended. If you have insomnia or poor sleep habits we can give you some instructions. If you have sleep apnea wear your mask.  Diet: Eating a heart healthy diet is also a good idea; fish and poultry instead of red meat, nuts (mostly non-peanuts), vegetables, fruits, olive oil or canola oil (instead of butter), minimal salt (use other spices to flavor foods), whole grain rice, bread, cereal and pasta and wine in moderation.Research is now showing that the MIND diet, which is a combination of The Mediterranean diet and the DASH diet, is beneficial for cognitive processing and longevity. Information about this diet can be found in The MIND Diet, a book by Alonna Minium, MS, RDN, and online at  [employer]Wild[employer].es  Finances, Power of [address] and Advance Directives: You should consider putting legal safeguards in place with regard to financial and medical decision making. While the spouse always has power of attorney for medical and financial issues in the absence of any form, you should consider what you want in case the spouse / caregiver is no longer around or capable of making decisions.

## 2023-03-15 NOTE — Progress Notes (Signed)
[facility]  PATIENT: [name] [name] DOB: 06-18-1946  REQUESTING CLINICIAN: Bradd Canary, MD HISTORY FROM: Patient and daughter REASON FOR VISIT: Worsening memory    HISTORICAL  CHIEF COMPLAINT:  Chief Complaint  Patient presents with   Follow-up    Rm 15, daughter present  Cognitive impairment, unsteady gait "FEELS DRUNK" ongoing for 6.5 months but gotten worse. [place] to better understand atn profile results.    INTERVAL HISTORY 03/15/2023:  Patient presents today for follow-up, he is accompanied by her daughter who had questions regarding the diagnosis.  His last visit was in February.  At that time we refer him to home PT for gait abnormality.  We also completed his ATN profile which was positive for Alzheimer dementia biomarkers.  He is on Namenda and Aricept.  He does complaints of some gait instability, feeling like he is drunk but this is improving. He is doing PT, no recent falls    INTERVAL HISTORY 01/27/2023:  Patient presents today for follow-up, last visit was a year ago in March.  He is accompanied today by daughter.  Since last visit daughter reports that his memory got worse.  He cannot remember nothing.  Patient is aware also that his memory is worse.  He reports that he can go to the kitchen and does not know why he came to the kitchen or what he needs to do.  On top of that, daughter reports that he had multiple falls, and also have sundowning effect.  He might get agitated at night.   HISTORY OF PRESENT ILLNESS:  Patient presents today for follow-up, he is accompanied by his daughter.  Last visit was in August 2022.  Since then daughter has reported that patient memory got worse.  He does repeat himself, asking the same questions over and over, daughter reports trouble with short-term memory, sometimes gets frustrated with his memory.  He is still having hallucinations, visual only, and now he is getting paranoid. At time, he will call  daughter by the wife's name who is deceased in August 18, 2021.  He still able to cook, clean, pays his bills, he does not drive, does have word finding difficulty and sometimes calling daughter by the wrong name.  Other than that he remains active, does a lot in the house.  He has occasional dizziness for which he will take meclizine.  Daughter has reported that the dizziness mostly happens after patient exerts himself or stand all morning cooking and cleaning.  Denies any falls recently.     PREVIOUS HISTORY FROM DR. WILLIS 07/07/2021:  [name] [name] is a 77 year old right-handed white male with a history of mild memory changes that date back several years. The patient was seen and evaluated by Dr. Everlena Cooper in 2016 for some cognitive changes felt associated with prior concussions. The patient underwent neuropsychological evaluation in 2018, the results suggest that the patient did not have an organic dementia at that time.  The patient has had some history of seizure type events, he was on Depakote for a number of years, but sometime since 2020, he went off the Depakote.  The patient now reports that he is having headaches with some regularity, at least 1 or 2 headaches a week.  The patient has not had any recent seizures.  He does have a history of diabetes, his most recent hemoglobin A1c was 7.2.  He reports that he chronically has insomnia, he will go to bed around 10:30 PM or 11 PM and wakes  up around 330 or 4 AM and cannot get back to sleep.  He has drowsiness during the day, he will oftentimes take naps.  He has chronic fatigue.  He reports some short-term memory, he cannot remember recent events. He still drives a car but occasionally he will get lost.  He is able to keep up with his medications and appointments, he does some of the finances but his wife does most of them.  He does report some gait instability, he has significant right knee arthritis and will be having knee surgery for total knee replacement  next week.  He denies any significant numbness of the feet.  He denies any focal weakness.  A recent CT scan of the brain does show some evidence of cerebrovascular disease including a left pontine stroke event.  He comes to this office for further evaluation.   OTHER MEDICAL CONDITIONS: Atrial fibrillation on Eliquis, CAD sp quadruple bypass, Diabetes, Hypothyroidism, anxiety depression   REVIEW OF SYSTEMS: Full 14 system review of systems performed and negative with exception of: as  noted in the HPI,  ALLERGIES: Allergies  Allergen Reactions   Morphine Other (See Comments)    Hallucinations   Shellfish-Derived Products Itching    HOME MEDICATIONS: Outpatient Medications Prior to Visit  Medication Sig Dispense Refill   amiodarone (PACERONE) 200 MG tablet Take 1 tablet (200 mg total) by mouth daily. 90 tablet 3   amitriptyline (ELAVIL) 10 MG tablet Take 1-3 tablets (10-30 mg total) by mouth at bedtime. 90 tablet 1   apixaban (ELIQUIS) 5 MG TABS tablet Take 1 tablet (5 mg total) by mouth 2 (two) times daily. 60 tablet 5   aspirin EC 81 MG tablet Take 81 mg by mouth daily. Swallow whole.     atorvastatin (LIPITOR) 80 MG tablet Take 1 tablet (80 mg total) by mouth daily. 90 tablet 1   BESIVANCE 0.6 % SUSP Instill 1 drop into right eye three times a day as directed. Please store upside down!! 5 mL 1   BESIVANCE 0.6 % SUSP Instill 1 drop into left eye three times a day as directed 5 mL 1   Continuous Blood Gluc Sensor (FREESTYLE LIBRE 2 SENSOR) MISC Use as instructed to check blood sugar, change every 14 days *No further refills until appt is scheduled* 6 each 3   COVID-19 mRNA vaccine 2023-2024 (COMIRNATY) syringe Inject into the muscle. 0.3 mL 0   dapagliflozin propanediol (FARXIGA) 5 MG TABS tablet Take 1 tablet (5 mg total) by mouth daily before breakfast. 30 tablet 6   diclofenac (VOLTAREN) 75 MG EC tablet Take 1 tablet (75 mg total) by mouth 2 (two) times daily. 20 tablet 0    Diclofenac Sodium (PENNSAID) 2 % SOLN Place 1 application onto the skin 2 (two) times daily. 112 g 2   divalproex (DEPAKOTE) 500 MG DR tablet Take 1 tablet (500 mg total) by mouth 2 (two) times daily. 60 tablet 5   donepezil (ARICEPT) 10 MG tablet Take 1 tablet (10 mg total) by mouth at bedtime. 90 tablet 4   fenofibrate 160 MG tablet Take 1 tablet (160 mg total) by mouth daily. 30 tablet 0   glucose blood (ACCU-CHEK AVIVA) test strip Use as directed to check blood sugar 4 times daily.  DX E11.9 200 each 6   hydrocortisone 2.5 % cream Apply on to the skin 2 (two) times daily. 30 g 1   hydrOXYzine (ATARAX) 10 MG tablet Take 1 tablet (10 mg total) by mouth  3 (three) times daily as needed for anxiety or itching. 30 tablet 1   influenza vaccine adjuvanted (FLUAD QUADRIVALENT) 0.5 ML injection Inject into the muscle. 0.5 mL 0   insulin glargine, 2 Unit Dial, (TOUJEO MAX SOLOSTAR) 300 UNIT/ML Solostar Pen Inject 60 Units into the skin daily in the afternoon. 30 mL 2   insulin lispro (HUMALOG KWIKPEN) 100 UNIT/ML KwikPen Inject 10 Units into the skin 3 (three) times daily. 45 mL 3   Insulin Pen Needle 31G X 5 MM MISC Use as directed to inject insulin in the morning, at noon, in the evening, and at bedtime. 400 each 3   isosorbide mononitrate (IMDUR) 120 MG 24 hr tablet Take 1 tablet (120 mg total) by mouth daily. 90 tablet 3   ketorolac (ACULAR) 0.5 % ophthalmic solution Place 1 drop into the right eye 4 times a day as directed 5 mL 1   ketorolac (ACULAR) 0.5 % ophthalmic solution Place 1 drop into the left eye 4 (four) times daily. 5 mL 1   levocetirizine (XYZAL) 5 MG tablet Take 1 tablet (5 mg total) by mouth every evening. 30 tablet 2   levothyroxine (SYNTHROID) 100 MCG tablet TAKE 1 TABLET (100 MCG TOTAL) BY MOUTH DAILY. 90 tablet 1   meclizine (ANTIVERT) 25 MG tablet Take 1 tablet (25 mg total) by mouth 3 (three) times daily as needed for dizziness. 30 tablet 0   memantine (NAMENDA) 10 MG tablet Take  1 tablet (10 mg total) by mouth 2 (two) times daily. 60 tablet 11   nitroGLYCERIN (NITROSTAT) 0.4 MG SL tablet PLACE 1 TABLET UNDER THE TONGUE EVERY 5 MINUTES AS NEEDED FOR CHEST PAIN. 25 tablet 1   pantoprazole (PROTONIX) 40 MG tablet Take 1 tablet (40 mg total) by mouth daily. 90 tablet 1   pramipexole (MIRAPEX) 1 MG tablet Take 1/2 to 1 tablet (0.5-1 mg total) by mouth at bedtime. 90 tablet 0   QUEtiapine (SEROQUEL) 25 MG tablet Take 1 tablet (25 mg total) by mouth at bedtime. 30 tablet 6   ranolazine (RANEXA) 500 MG 12 hr tablet TAKE 1 TABLET BY MOUTH TWICE DAILY (Patient taking differently: Take 500 mg by mouth 2 (two) times daily.) 60 tablet 5   sertraline (ZOLOFT) 100 MG tablet Take 1 tablet (100 mg total) by mouth 2 (two) times daily. 180 tablet 0   triamcinolone cream (KENALOG) 0.1 % Apply 1 application topically 2 (two) times daily. Use as needed for itchy rash 30 g 1   No facility-administered medications prior to visit.    PAST MEDICAL HISTORY: Past Medical History:  Diagnosis Date   Acquired atrophy of thyroid 06/05/2013   Overview:  July 2014: controlled on synthroid since 2012 May 2015: decreased to Synthroid 50 mcg  Last Assessment & Plan:  Pt doing well with most recent TSH within normal range.  Will continue current dosage of Synthroid . Pt reminded to take medication on empty stomach. Will continue to monitor with periodic laboratory assessment in 3 months.    Anemia    hemoglobin 7.4, iron deficiency, January, 2011, 2 unit transfusion, endoscopy normal, capsule endoscopy February, 2011 no small bowel abnormalities.   Most likely source gastric erosions, followed by GI   Anxiety    Bradycardia    CAD (coronary artery disease)    A. CABG in 2000,status post cardiac cath in 2006, 2009 ....continued chest pain and SOB despite oral medication adjestments including Ranexa. B. Cath November 2009/ mRCA - 2.75 x 23  Abbott Xience V drug-eluting stent ...11/26/2008 to distal   RCA leading to acute marginal.  C. Cath 07/2012 for CP - stable anatomy, med rx. d. cath 2015 and 05/30/2015 stable anatomy, consider Myoview if has CP again   Carotid artery disease 08/01/2015   Doppler, May 29, 2015, 1-39% bilateral ICA    Cerebral ischemia    MRI November, 2010, chronic microvascular ischemia   CKD (chronic kidney disease), stage III    Concussion    Depression    Bipolar   Diabetes mellitus    Edema    Essential hypertension 06/05/2013   Overview:  July 2014: Controlled with Bystolic Sep 2015: Changed to atenolol.  Last Assessment & Plan:  Will change to atenolol for cost and follow Improved.  Medication compliance strongly encouraged BP: 116/64 mmHg    Overview:  July 2014: Controlled with Bystolic Sep 2015: Changed to atenolol.  Last Assessment & Plan:  Will change to atenolol for cost and follow   Falling episodes    these have occurred in the past and again recurring 2011   Family history of adverse reaction to anesthesia    "mother died during bypass surgery but not sure if it has to do with anesthesia"   Gastric ulcer    GERD (gastroesophageal reflux disease)    H/O medication noncompliance    Due to loss of insurance   H/O multiple concussions    Hard of hearing    Heart murmur    History of blood transfusion 12/20/2013   History of kidney stones    Hx of CABG    2000,  / one median sternotomy suture broken her chest x-ray November, 2010, no clinical significance   Hyperlipidemia    Hypertension    pt. denies   Iron deficiency anemia    Long term (current) use of anticoagulants [Z79.01] 07/20/2016   Low back pain 06/12/2009   Qualifier: Diagnosis of  By: Nena Jordan    Nephrolithiasis    Orthostasis    OSA (obstructive sleep apnea)    PAF (paroxysmal atrial fibrillation)    a. dx 2017, started on amiodarone/warfarin but patient intermittently noncompliant.   PSVT (paroxysmal supraventricular tachycardia)    RBBB 07/09/2009   Qualifier:  Diagnosis of  By: Myrtis Ser, MD, [facility], Lemmie Evens    RLS (restless legs syndrome) 09/19/2009   Qualifier: Diagnosis of  By: Nena Jordan   Overview:  July 2014: Controlled with Mirapex  Last Assessment & Plan:  Patient is doing well. Will continue current management and follow clinically.   Seizure disorder 01/09/2017   Spondylosis    C5-6, C6-7 MRI 2010   Syncope 03/2016   TBI (traumatic brain injury) 2019   Thyroid disease    Tubulovillous adenoma of colon 2007   Vitamin D deficiency 05/17/2017   Wears glasses     PAST SURGICAL HISTORY: Past Surgical History:  Procedure Laterality Date   ANTERIOR CERVICAL DECOMP/DISCECTOMY FUSION N/A 05/31/2016   Procedure: ANTERIOR CERVICAL DECOMPRESSION/DISCECTOMY FUSION CERVICAL FIVE-SIX,CERVICAL SIX-SEVEN;  Surgeon: Julio Sicks, MD;  Location: [facility] NEURO ORS;  Service: Neurosurgery;  Laterality: N/A;   CARDIAC CATHETERIZATION N/A 05/30/2015   Procedure: Left Heart Cath and Coronary Angiography;  Surgeon: Marykay Lex, MD;  Location: [facility] INVASIVE CV LAB;  Service: Cardiovascular;  Laterality: N/A;   CATARACT EXTRACTION     COLONOSCOPY     CORONARY ARTERY BYPASS GRAFT     2000   ESOPHAGOGASTRODUODENOSCOPY     LEFT  HEART CATH AND CORS/GRAFTS ANGIOGRAPHY N/A 12/15/2017   Procedure: LEFT HEART CATH AND CORS/GRAFTS ANGIOGRAPHY;  Surgeon: Corky Crafts, MD;  Location: [facility] INVASIVE CV LAB;  Service: Cardiovascular;  Laterality: N/A;   LEFT HEART CATHETERIZATION WITH CORONARY/GRAFT ANGIOGRAM N/A 08/01/2012   Procedure: LEFT HEART CATHETERIZATION WITH Isabel Caprice;  Surgeon: Herby Abraham, MD;  Location: [facility] CATH LAB;  Service: Cardiovascular;  Laterality: N/A;   LEFT HEART CATHETERIZATION WITH CORONARY/GRAFT ANGIOGRAM N/A 01/03/2015   Procedure: LEFT HEART CATHETERIZATION WITH Isabel Caprice;  Surgeon: Runell Gess, MD;  Location: [facility] CATH LAB;  Service: Cardiovascular;  Laterality: N/A;   NASAL SEPTUM SURGERY     UP3    PERCUTANEOUS CORONARY STENT INTERVENTION (PCI-S)  10/06/2008   mRCA PCI  2.75 x 23 Abbott Xience V drug-eluting stent    RIGHT/LEFT HEART CATH AND CORONARY/GRAFT ANGIOGRAPHY N/A 01/07/2020   Procedure: RIGHT/LEFT HEART CATH AND CORONARY/GRAFT ANGIOGRAPHY;  Surgeon: Lyn Records, MD;  Location: [facility] INVASIVE CV LAB;  Service: Cardiovascular;  Laterality: N/A;   TOTAL KNEE ARTHROPLASTY Right 09/22/2021   Procedure: RIGHT TOTAL KNEE ARTHROPLASTY;  Surgeon: Marcene Corning, MD;  Location: [facility] ORS;  Service: Orthopedics;  Laterality: Right;   ULTRASOUND GUIDANCE FOR VASCULAR ACCESS  12/15/2017   Procedure: Ultrasound Guidance For Vascular Access;  Surgeon: Corky Crafts, MD;  Location: [facility] INVASIVE CV LAB;  Service: Cardiovascular;;    FAMILY HISTORY: Family History  Problem Relation Age of Onset   Pancreatic cancer Brother    Diabetes Brother    Coronary artery disease Brother    Stroke Brother    Diabetes Brother    Diabetes Mother    Heart failure Mother    Heart failure Father    Hypothyroidism Brother    Coronary artery disease Brother    Other Brother        colon surgery   Heart attack Other        Nephew   Irregular heart beat Daughter    Cancer Maternal Grandmother        unknown    Colon cancer Neg Hx    Stomach cancer Neg Hx    Liver cancer Neg Hx    Rectal cancer Neg Hx    Esophageal cancer Neg Hx     SOCIAL HISTORY: Social History   Socioeconomic History   Marital status: Married    Spouse name: Not on file   Number of children: 4   Years of education: 13   Highest education level: Not on file  Occupational History   Occupation: retired  Tobacco Use   Smoking status: Never    Passive exposure: Never   Smokeless tobacco: Never  Vaping Use   Vaping Use: Never used  Substance and Sexual Activity   Alcohol use: No    Alcohol/week: 0.0 standard drinks of alcohol    Comment: stopped drinking in 1998   Drug use: No   Sexual activity: Not Currently     Partners: Female  Other Topics Concern   Not on file  Social History Narrative   Patient is right handed.   Patient does not drink caffeine.   Social Determinants of Health   Financial Resource Strain: Medium Risk (08/02/2022)   Overall Financial Resource Strain (CARDIA)    Difficulty of Paying Living Expenses: Somewhat hard  Food Insecurity: No Food Insecurity (08/02/2022)   Hunger Vital Sign    Worried About Running Out of Food in the Last Year: Never true  Ran Out of Food in the Last Year: Never true  Transportation Needs: No Transportation Needs (08/02/2022)   PRAPARE - Administrator, Civil Service (Medical): No    Lack of Transportation (Non-Medical): No  Physical Activity: Insufficiently Active (08/02/2022)   Exercise Vital Sign    Days of Exercise per Week: 7 days    Minutes of Exercise per Session: 20 min  Stress: Stress Concern Present (08/02/2022)   [employer] of Occupational Health - Occupational Stress Questionnaire    Feeling of Stress : To some extent  Social Connections: Moderately Isolated (08/02/2022)   Social Connection and Isolation Panel [NHANES]    Frequency of Communication with Friends and Family: More than three times a week    Frequency of Social Gatherings with Friends and Family: More than three times a week    Attends Religious Services: More than 4 times per year    Active Member of [employer] or Organizations: No    Attends Banker Meetings: Never    Marital Status: Widowed  Intimate Partner Violence: Not At Risk (08/02/2022)   Humiliation, Afraid, Rape, and Kick questionnaire    Fear of Current or Ex-Partner: No    Emotionally Abused: No    Physically Abused: No    Sexually Abused: No    PHYSICAL EXAM  GENERAL EXAM/CONSTITUTIONAL: Vitals:  Vitals:   03/15/23 0836 03/15/23 0841  BP: (!) 183/70 (!) 176/71  Pulse: (!) 58 61  Weight: 170 lb 8 oz (77.3 kg)   Height: 5\' 8"  (1.727 m)     Body mass index is 25.92  kg/m. Wt Readings from Last 3 Encounters:  03/15/23 170 lb 8 oz (77.3 kg)  03/03/23 173 lb (78.5 kg)  01/27/23 176 lb (79.8 kg)   Patient is in no distress; well developed, nourished and groomed; neck is supple  EYES: Visual fields full to confrontation, Extraocular movements intacts,   MUSCULOSKELETAL: Gait, strength, tone, movements noted in Neurologic exam below  NEUROLOGIC: MENTAL STATUS:     12/21/2022   10:41 AM 07/07/2021   10:16 AM 04/21/2017   11:00 AM  MMSE - Mini Mental State Exam  Not completed:   Unable to complete  Orientation to time 4 4   Orientation to Place 4 4   Registration 3 3   Attention/ Calculation 0 1   Recall 3 3   Language- name 2 objects 2 2   Language- repeat 1 0   Language- follow 3 step command 3 3   Language- read & follow direction 1 1   Write a sentence 1 1   Copy design 1 0   Total score 23 22    awake, alert, oriented to person, place and time Was only able to recall the last 2 [place] presidents.   CRANIAL NERVE:  2nd, 3rd, 4th, 6th - visual fields full to confrontation, extraocular muscles intact, no nystagmus 5th - facial sensation symmetric 7th - facial strength symmetric 8th - hearing intact 9th - palate elevates symmetrically, uvula midline 11th - shoulder shrug symmetric 12th - tongue protrusion midline  MOTOR:  normal bulk and tone, full strength in the BUE, BLE  SENSORY:  normal and symmetric to light touch  COORDINATION:  finger-nose-finger, fine finger movements normal  REFLEXES:  deep tendon reflexes present and symmetric  GAIT/STATION:   Slow, wide base. He does have a positive romberg.      DIAGNOSTIC DATA (LABS, IMAGING, TESTING) - I reviewed patient records, labs, notes, testing  and imaging myself where available.  Lab Results  Component Value Date   WBC 9.9 01/05/2023   HGB 13.7 01/05/2023   HCT 40.3 01/05/2023   MCV 90.2 01/05/2023   PLT 170 01/05/2023      Component Value Date/Time   NA 137  01/05/2023 1643   NA 134 10/23/2020 1054   K 4.1 01/05/2023 1643   CL 104 01/05/2023 1643   CO2 25 01/05/2023 1643   GLUCOSE 129 (H) 01/05/2023 1643   BUN 26 (H) 01/05/2023 1643   BUN 22 10/23/2020 1054   CREATININE 1.60 (H) 01/05/2023 1643   CREATININE 2.02 (H) 09/15/2020 0855   CALCIUM 8.9 01/05/2023 1643   PROT 6.7 11/12/2022 0843   PROT 6.8 09/27/2017 0823   ALBUMIN 4.3 11/12/2022 0843   ALBUMIN 4.4 09/27/2017 0823   AST 27 11/12/2022 0843   ALT 31 11/12/2022 0843   ALKPHOS 95 11/12/2022 0843   BILITOT 0.6 11/12/2022 0843   BILITOT 0.6 09/27/2017 0823   GFRNONAA 45 (L) 01/05/2023 1643   GFRAA 38 (L) 10/23/2020 1054   Lab Results  Component Value Date   CHOL 95 11/12/2022   HDL 41.00 11/12/2022   LDLCALC 37 11/12/2022   LDLDIRECT 77.0 08/20/2019   TRIG 81.0 11/12/2022   CHOLHDL 2 11/12/2022   Lab Results  Component Value Date   HGBA1C 9.3 (A) 03/03/2023   Lab Results  Component Value Date   VITAMINB12 646 07/07/2021   Lab Results  Component Value Date   TSH 13.42 (H) 11/12/2022    Head CT 02/12/2022 1. No evidence of acute intracranial abnormality. 2. Similar atrophy and chronic microvascular ischemic disease.  ATN Profile positive for presence for Alzheimer disease biomarker's    ASSESSMENT AND PLAN  77 y.o. year old male with multiple medical conditions including atrial fibrillation, coronary artery disease, diabetes, hypertension, hyperlipidemia who is presenting for follow-up for his mild cognitive impairment due to Alzheimer disease. Doing well on Aricept and Namenda. He is complaining of dizziness. I have asked him to decrease Aricept to half tablet for 2 weeks then increase back to full tablet. I will see them for follow up in a year.    1. MCI (mild cognitive impairment)      Patient Instructions  Decrease Aricept to half tablet nightly for 2 weeks due to concern of dizziness then increase to full tablet. Continue with Namenda 10 mg twice  daily Continue your other medications Continue follow-up PCP Return as scheduled in a year or sooner if worse.   There are well-accepted and sensible ways to reduce risk for Alzheimers disease and other degenerative brain disorders .  Exercise Daily Walk A daily 20 minute walk should be part of your routine. Disease related apathy can be a significant roadblock to exercise and the only way to overcome this is to make it a daily routine and perhaps have a reward at the end (something your loved one loves to eat or drink perhaps) or a personal trainer coming to the home can also be very useful. Most importantly, the patient is much more likely to exercise if the caregiver / spouse does it with him/her. In general a structured, repetitive schedule is best.  General Health: Any diseases which effect your body will effect your brain such as a pneumonia, urinary infection, blood clot, heart attack or stroke. Keep contact with your primary care doctor for regular follow ups.  Sleep. A good nights sleep is healthy for the brain. Seven hours is  recommended. If you have insomnia or poor sleep habits we can give you some instructions. If you have sleep apnea wear your mask.  Diet: Eating a heart healthy diet is also a good idea; fish and poultry instead of red meat, nuts (mostly non-peanuts), vegetables, fruits, olive oil or canola oil (instead of butter), minimal salt (use other spices to flavor foods), whole grain rice, bread, cereal and pasta and wine in moderation.Research is now showing that the MIND diet, which is a combination of The Mediterranean diet and the DASH diet, is beneficial for cognitive processing and longevity. Information about this diet can be found in The MIND Diet, a book by Alonna Minium, MS, RDN, and online at [employer]  Finances, Power of [address] and Advance Directives: You should consider putting legal safeguards in place with regard to financial  and medical decision making. While the spouse always has power of attorney for medical and financial issues in the absence of any form, you should consider what you want in case the spouse / caregiver is no longer around or capable of making decisions.   No orders of the defined types were placed in this encounter.   No orders of the defined types were placed in this encounter.   No follow-ups on file.    Windell Norfolk, MD 03/15/2023, 9:25 AM  [facility] [address], [address] [place], [place] [address] [phone]

## 2023-03-16 DIAGNOSIS — D509 Iron deficiency anemia, unspecified: Secondary | ICD-10-CM | POA: Diagnosis not present

## 2023-03-16 DIAGNOSIS — H9190 Unspecified hearing loss, unspecified ear: Secondary | ICD-10-CM | POA: Diagnosis not present

## 2023-03-16 DIAGNOSIS — K219 Gastro-esophageal reflux disease without esophagitis: Secondary | ICD-10-CM | POA: Diagnosis not present

## 2023-03-16 DIAGNOSIS — Z7982 Long term (current) use of aspirin: Secondary | ICD-10-CM | POA: Diagnosis not present

## 2023-03-16 DIAGNOSIS — F32A Depression, unspecified: Secondary | ICD-10-CM | POA: Diagnosis not present

## 2023-03-16 DIAGNOSIS — F0283 Dementia in other diseases classified elsewhere, unspecified severity, with mood disturbance: Secondary | ICD-10-CM | POA: Diagnosis not present

## 2023-03-16 DIAGNOSIS — E785 Hyperlipidemia, unspecified: Secondary | ICD-10-CM | POA: Diagnosis not present

## 2023-03-16 DIAGNOSIS — N183 Chronic kidney disease, stage 3 unspecified: Secondary | ICD-10-CM | POA: Diagnosis not present

## 2023-03-16 DIAGNOSIS — I4510 Unspecified right bundle-branch block: Secondary | ICD-10-CM | POA: Diagnosis not present

## 2023-03-16 DIAGNOSIS — G2581 Restless legs syndrome: Secondary | ICD-10-CM | POA: Diagnosis not present

## 2023-03-16 DIAGNOSIS — I480 Paroxysmal atrial fibrillation: Secondary | ICD-10-CM | POA: Diagnosis not present

## 2023-03-16 DIAGNOSIS — E559 Vitamin D deficiency, unspecified: Secondary | ICD-10-CM | POA: Diagnosis not present

## 2023-03-16 DIAGNOSIS — I471 Supraventricular tachycardia, unspecified: Secondary | ICD-10-CM | POA: Diagnosis not present

## 2023-03-16 DIAGNOSIS — E1122 Type 2 diabetes mellitus with diabetic chronic kidney disease: Secondary | ICD-10-CM | POA: Diagnosis not present

## 2023-03-16 DIAGNOSIS — F0284 Dementia in other diseases classified elsewhere, unspecified severity, with anxiety: Secondary | ICD-10-CM | POA: Diagnosis not present

## 2023-03-16 DIAGNOSIS — I2510 Atherosclerotic heart disease of native coronary artery without angina pectoris: Secondary | ICD-10-CM | POA: Diagnosis not present

## 2023-03-16 DIAGNOSIS — G4733 Obstructive sleep apnea (adult) (pediatric): Secondary | ICD-10-CM | POA: Diagnosis not present

## 2023-03-16 DIAGNOSIS — Z7901 Long term (current) use of anticoagulants: Secondary | ICD-10-CM | POA: Diagnosis not present

## 2023-03-16 DIAGNOSIS — E039 Hypothyroidism, unspecified: Secondary | ICD-10-CM | POA: Diagnosis not present

## 2023-03-16 DIAGNOSIS — I129 Hypertensive chronic kidney disease with stage 1 through stage 4 chronic kidney disease, or unspecified chronic kidney disease: Secondary | ICD-10-CM | POA: Diagnosis not present

## 2023-03-16 DIAGNOSIS — D631 Anemia in chronic kidney disease: Secondary | ICD-10-CM | POA: Diagnosis not present

## 2023-03-16 DIAGNOSIS — M47812 Spondylosis without myelopathy or radiculopathy, cervical region: Secondary | ICD-10-CM | POA: Diagnosis not present

## 2023-03-16 DIAGNOSIS — Z794 Long term (current) use of insulin: Secondary | ICD-10-CM | POA: Diagnosis not present

## 2023-03-17 ENCOUNTER — Other Ambulatory Visit (HOSPITAL_BASED_OUTPATIENT_CLINIC_OR_DEPARTMENT_OTHER): Payer: Self-pay

## 2023-03-21 ENCOUNTER — Other Ambulatory Visit (HOSPITAL_BASED_OUTPATIENT_CLINIC_OR_DEPARTMENT_OTHER): Payer: Self-pay

## 2023-03-21 ENCOUNTER — Encounter: Payer: Self-pay | Admitting: *Deleted

## 2023-03-25 DIAGNOSIS — I4510 Unspecified right bundle-branch block: Secondary | ICD-10-CM | POA: Diagnosis not present

## 2023-03-25 DIAGNOSIS — H9190 Unspecified hearing loss, unspecified ear: Secondary | ICD-10-CM | POA: Diagnosis not present

## 2023-03-25 DIAGNOSIS — F0284 Dementia in other diseases classified elsewhere, unspecified severity, with anxiety: Secondary | ICD-10-CM | POA: Diagnosis not present

## 2023-03-25 DIAGNOSIS — G2581 Restless legs syndrome: Secondary | ICD-10-CM | POA: Diagnosis not present

## 2023-03-25 DIAGNOSIS — I2510 Atherosclerotic heart disease of native coronary artery without angina pectoris: Secondary | ICD-10-CM | POA: Diagnosis not present

## 2023-03-25 DIAGNOSIS — E1122 Type 2 diabetes mellitus with diabetic chronic kidney disease: Secondary | ICD-10-CM | POA: Diagnosis not present

## 2023-03-25 DIAGNOSIS — K219 Gastro-esophageal reflux disease without esophagitis: Secondary | ICD-10-CM | POA: Diagnosis not present

## 2023-03-25 DIAGNOSIS — E785 Hyperlipidemia, unspecified: Secondary | ICD-10-CM | POA: Diagnosis not present

## 2023-03-25 DIAGNOSIS — F0283 Dementia in other diseases classified elsewhere, unspecified severity, with mood disturbance: Secondary | ICD-10-CM | POA: Diagnosis not present

## 2023-03-25 DIAGNOSIS — M47812 Spondylosis without myelopathy or radiculopathy, cervical region: Secondary | ICD-10-CM | POA: Diagnosis not present

## 2023-03-25 DIAGNOSIS — D509 Iron deficiency anemia, unspecified: Secondary | ICD-10-CM | POA: Diagnosis not present

## 2023-03-25 DIAGNOSIS — F32A Depression, unspecified: Secondary | ICD-10-CM | POA: Diagnosis not present

## 2023-03-25 DIAGNOSIS — D631 Anemia in chronic kidney disease: Secondary | ICD-10-CM | POA: Diagnosis not present

## 2023-03-25 DIAGNOSIS — E039 Hypothyroidism, unspecified: Secondary | ICD-10-CM | POA: Diagnosis not present

## 2023-03-25 DIAGNOSIS — N183 Chronic kidney disease, stage 3 unspecified: Secondary | ICD-10-CM | POA: Diagnosis not present

## 2023-03-25 DIAGNOSIS — I480 Paroxysmal atrial fibrillation: Secondary | ICD-10-CM | POA: Diagnosis not present

## 2023-03-25 DIAGNOSIS — I129 Hypertensive chronic kidney disease with stage 1 through stage 4 chronic kidney disease, or unspecified chronic kidney disease: Secondary | ICD-10-CM | POA: Diagnosis not present

## 2023-03-25 DIAGNOSIS — Z794 Long term (current) use of insulin: Secondary | ICD-10-CM | POA: Diagnosis not present

## 2023-03-25 DIAGNOSIS — Z7982 Long term (current) use of aspirin: Secondary | ICD-10-CM | POA: Diagnosis not present

## 2023-03-25 DIAGNOSIS — E559 Vitamin D deficiency, unspecified: Secondary | ICD-10-CM | POA: Diagnosis not present

## 2023-03-25 DIAGNOSIS — G4733 Obstructive sleep apnea (adult) (pediatric): Secondary | ICD-10-CM | POA: Diagnosis not present

## 2023-03-25 DIAGNOSIS — Z7901 Long term (current) use of anticoagulants: Secondary | ICD-10-CM | POA: Diagnosis not present

## 2023-03-25 DIAGNOSIS — I471 Supraventricular tachycardia, unspecified: Secondary | ICD-10-CM | POA: Diagnosis not present

## 2023-03-30 ENCOUNTER — Other Ambulatory Visit: Payer: Self-pay | Admitting: Family Medicine

## 2023-03-30 ENCOUNTER — Other Ambulatory Visit (HOSPITAL_BASED_OUTPATIENT_CLINIC_OR_DEPARTMENT_OTHER): Payer: Self-pay

## 2023-03-30 DIAGNOSIS — G2581 Restless legs syndrome: Secondary | ICD-10-CM | POA: Diagnosis not present

## 2023-03-30 DIAGNOSIS — G4733 Obstructive sleep apnea (adult) (pediatric): Secondary | ICD-10-CM | POA: Diagnosis not present

## 2023-03-30 DIAGNOSIS — E785 Hyperlipidemia, unspecified: Secondary | ICD-10-CM | POA: Diagnosis not present

## 2023-03-30 DIAGNOSIS — F0284 Dementia in other diseases classified elsewhere, unspecified severity, with anxiety: Secondary | ICD-10-CM | POA: Diagnosis not present

## 2023-03-30 DIAGNOSIS — E559 Vitamin D deficiency, unspecified: Secondary | ICD-10-CM | POA: Diagnosis not present

## 2023-03-30 DIAGNOSIS — Z7901 Long term (current) use of anticoagulants: Secondary | ICD-10-CM | POA: Diagnosis not present

## 2023-03-30 DIAGNOSIS — I471 Supraventricular tachycardia, unspecified: Secondary | ICD-10-CM | POA: Diagnosis not present

## 2023-03-30 DIAGNOSIS — L309 Dermatitis, unspecified: Secondary | ICD-10-CM

## 2023-03-30 DIAGNOSIS — Z794 Long term (current) use of insulin: Secondary | ICD-10-CM | POA: Diagnosis not present

## 2023-03-30 DIAGNOSIS — F0283 Dementia in other diseases classified elsewhere, unspecified severity, with mood disturbance: Secondary | ICD-10-CM | POA: Diagnosis not present

## 2023-03-30 DIAGNOSIS — I4510 Unspecified right bundle-branch block: Secondary | ICD-10-CM | POA: Diagnosis not present

## 2023-03-30 DIAGNOSIS — E1122 Type 2 diabetes mellitus with diabetic chronic kidney disease: Secondary | ICD-10-CM | POA: Diagnosis not present

## 2023-03-30 DIAGNOSIS — I480 Paroxysmal atrial fibrillation: Secondary | ICD-10-CM | POA: Diagnosis not present

## 2023-03-30 DIAGNOSIS — D509 Iron deficiency anemia, unspecified: Secondary | ICD-10-CM | POA: Diagnosis not present

## 2023-03-30 DIAGNOSIS — I2510 Atherosclerotic heart disease of native coronary artery without angina pectoris: Secondary | ICD-10-CM | POA: Diagnosis not present

## 2023-03-30 DIAGNOSIS — Z7982 Long term (current) use of aspirin: Secondary | ICD-10-CM | POA: Diagnosis not present

## 2023-03-30 DIAGNOSIS — N183 Chronic kidney disease, stage 3 unspecified: Secondary | ICD-10-CM | POA: Diagnosis not present

## 2023-03-30 DIAGNOSIS — E039 Hypothyroidism, unspecified: Secondary | ICD-10-CM | POA: Diagnosis not present

## 2023-03-30 DIAGNOSIS — M47812 Spondylosis without myelopathy or radiculopathy, cervical region: Secondary | ICD-10-CM | POA: Diagnosis not present

## 2023-03-30 DIAGNOSIS — I129 Hypertensive chronic kidney disease with stage 1 through stage 4 chronic kidney disease, or unspecified chronic kidney disease: Secondary | ICD-10-CM | POA: Diagnosis not present

## 2023-03-30 DIAGNOSIS — K219 Gastro-esophageal reflux disease without esophagitis: Secondary | ICD-10-CM | POA: Diagnosis not present

## 2023-03-30 DIAGNOSIS — F32A Depression, unspecified: Secondary | ICD-10-CM | POA: Diagnosis not present

## 2023-03-30 DIAGNOSIS — H9190 Unspecified hearing loss, unspecified ear: Secondary | ICD-10-CM | POA: Diagnosis not present

## 2023-03-30 DIAGNOSIS — D631 Anemia in chronic kidney disease: Secondary | ICD-10-CM | POA: Diagnosis not present

## 2023-03-30 MED ORDER — HYDROCORTISONE 2.5 % EX CREA
TOPICAL_CREAM | Freq: Two times a day (BID) | CUTANEOUS | 1 refills | Status: DC
  Filled 2023-03-30: qty 30, fill #0
  Filled 2023-04-18 – 2023-04-20 (×2): qty 30, 30d supply, fill #0
  Filled 2023-05-30: qty 30, 30d supply, fill #1

## 2023-03-31 NOTE — Telephone Encounter (Signed)
[place] called from [facility]. Stated pt is non-compliance with the timing of his medication.Pt stated having blurry vision and stated he is having heart palpation. Statted she needs order for Rolator. Need verbal for PT for 1 week one and 2 week one, and one week, six. Fyi: Beg: 148/87  heart reate 62  End 154/76 heart rate 59

## 2023-04-07 ENCOUNTER — Other Ambulatory Visit: Payer: Self-pay | Admitting: Family Medicine

## 2023-04-07 ENCOUNTER — Other Ambulatory Visit (HOSPITAL_BASED_OUTPATIENT_CLINIC_OR_DEPARTMENT_OTHER): Payer: Self-pay

## 2023-04-07 DIAGNOSIS — L308 Other specified dermatitis: Secondary | ICD-10-CM

## 2023-04-07 MED ORDER — LEVOCETIRIZINE DIHYDROCHLORIDE 5 MG PO TABS
5.0000 mg | ORAL_TABLET | Freq: Every evening | ORAL | 2 refills | Status: DC
  Filled 2023-04-07 – 2023-05-02 (×2): qty 30, 30d supply, fill #0
  Filled 2023-05-30: qty 30, 30d supply, fill #1
  Filled 2023-06-27: qty 30, 30d supply, fill #2

## 2023-04-07 NOTE — Telephone Encounter (Signed)
Called and spoke to caroline and gave Verbal PT orders.

## 2023-04-07 NOTE — Telephone Encounter (Signed)
Please call [place] Citizen from [facility] to discuss this order put in for the pt. and changes that are going to be needed. [phone]

## 2023-04-07 NOTE — Telephone Encounter (Signed)
Called and Left a VM for CB.

## 2023-04-08 ENCOUNTER — Other Ambulatory Visit (HOSPITAL_BASED_OUTPATIENT_CLINIC_OR_DEPARTMENT_OTHER): Payer: Self-pay

## 2023-04-08 ENCOUNTER — Other Ambulatory Visit: Payer: Self-pay | Admitting: Family Medicine

## 2023-04-08 MED ORDER — ATORVASTATIN CALCIUM 80 MG PO TABS
80.0000 mg | ORAL_TABLET | Freq: Every day | ORAL | 0 refills | Status: DC
  Filled 2023-04-08 – 2023-05-31 (×2): qty 90, 90d supply, fill #0

## 2023-04-08 MED ORDER — LEVOTHYROXINE SODIUM 100 MCG PO TABS
100.0000 ug | ORAL_TABLET | Freq: Every day | ORAL | 0 refills | Status: DC
  Filled 2023-04-08 – 2023-04-20 (×2): qty 90, 90d supply, fill #0

## 2023-04-08 NOTE — Telephone Encounter (Signed)
Thanks. Agree with PT

## 2023-04-11 ENCOUNTER — Other Ambulatory Visit: Payer: Self-pay

## 2023-04-11 ENCOUNTER — Other Ambulatory Visit (HOSPITAL_BASED_OUTPATIENT_CLINIC_OR_DEPARTMENT_OTHER): Payer: Self-pay

## 2023-04-11 NOTE — Progress Notes (Deleted)
Office Visit    Patient Name: [name] [name] Date of Encounter: 04/11/2023  Primary Care Provider:  Bradd Canary, MD Primary Cardiologist:  Lance Muss, MD Primary Electrophysiologist: None   Past Medical History    Past Medical History:  Diagnosis Date   Acquired atrophy of thyroid 06/05/2013   Overview:  July 2014: controlled on synthroid since 2012 May 2015: decreased to Synthroid 50 mcg  Last Assessment & Plan:  Pt doing well with most recent TSH within normal range.  Will continue current dosage of Synthroid . Pt reminded to take medication on empty stomach. Will continue to monitor with periodic laboratory assessment in 3 months.    Anemia    hemoglobin 7.4, iron deficiency, January, 2011, 2 unit transfusion, endoscopy normal, capsule endoscopy February, 2011 no small bowel abnormalities.   Most likely source gastric erosions, followed by GI   Anxiety    Bradycardia    CAD (coronary artery disease)    A. CABG in 2000,status post cardiac cath in 2006, 2009 ....continued chest pain and SOB despite oral medication adjestments including Ranexa. B. Cath November 2009/ mRCA - 2.75 x 23 Abbott Xience V drug-eluting stent ...11/26/2008 to distal  RCA leading to acute marginal.  C. Cath 07/2012 for CP - stable anatomy, med rx. d. cath 2015 and 05/30/2015 stable anatomy, consider Myoview if has CP again   Carotid artery disease (HCC) 08/01/2015   Doppler, May 29, 2015, 1-39% bilateral ICA    Cerebral ischemia    MRI November, 2010, chronic microvascular ischemia   CKD (chronic kidney disease), stage III (HCC)    Concussion    Depression    Bipolar   Diabetes mellitus (HCC)    Edema    Essential hypertension 06/05/2013   Overview:  July 2014: Controlled with Bystolic Sep 2015: Changed to atenolol.  Last Assessment & Plan:  Will change to atenolol for cost and follow Improved.  Medication compliance strongly encouraged BP: 116/64 mmHg    Overview:  July 2014:  Controlled with Bystolic Sep 2015: Changed to atenolol.  Last Assessment & Plan:  Will change to atenolol for cost and follow   Falling episodes    these have occurred in the past and again recurring 2011   Family history of adverse reaction to anesthesia    "mother died during bypass surgery but not sure if it has to do with anesthesia"   Gastric ulcer    GERD (gastroesophageal reflux disease)    H/O medication noncompliance    Due to loss of insurance   H/O multiple concussions    Hard of hearing    Heart murmur    History of blood transfusion 12/20/2013   History of kidney stones    Hx of CABG    2000,  / one median sternotomy suture broken her chest x-ray November, 2010, no clinical significance   Hyperlipidemia    Hypertension    pt. denies   Iron deficiency anemia    Long term (current) use of anticoagulants [Z79.01] 07/20/2016   Low back pain 06/12/2009   Qualifier: Diagnosis of  By: Nena Jordan    Nephrolithiasis    Orthostasis    OSA (obstructive sleep apnea)    PAF (paroxysmal atrial fibrillation) (HCC)    a. dx 2017, started on amiodarone/warfarin but patient intermittently noncompliant.   PSVT (paroxysmal supraventricular tachycardia)    RBBB 07/09/2009   Qualifier: Diagnosis of  By: Myrtis Ser, MD, Ruthann Cancer Lemmie Evens  RLS (restless legs syndrome) 09/19/2009   Qualifier: Diagnosis of  By: Nena Jordan   Overview:  July 2014: Controlled with Mirapex  Last Assessment & Plan:  Patient is doing well. Will continue current management and follow clinically.   Seizure disorder (HCC) 01/09/2017   Spondylosis    C5-6, C6-7 MRI 2010   Syncope 03/2016   TBI (traumatic brain injury) (HCC) 2019   Thyroid disease    Tubulovillous adenoma of colon 2007   Vitamin D deficiency 05/17/2017   Wears glasses    Past Surgical History:  Procedure Laterality Date   ANTERIOR CERVICAL DECOMP/DISCECTOMY FUSION N/A 05/31/2016   Procedure: ANTERIOR CERVICAL  DECOMPRESSION/DISCECTOMY FUSION CERVICAL FIVE-SIX,CERVICAL SIX-SEVEN;  Surgeon: Julio Sicks, MD;  Location: [facility];  Service: Neurosurgery;  Laterality: N/A;   CARDIAC CATHETERIZATION N/A 05/30/2015   Procedure: Left Heart Cath and Coronary Angiography;  Surgeon: Marykay Lex, MD;  Location: [facility] INVASIVE CV LAB;  Service: Cardiovascular;  Laterality: N/A;   CATARACT EXTRACTION     COLONOSCOPY     CORONARY ARTERY BYPASS GRAFT     2000   ESOPHAGOGASTRODUODENOSCOPY     LEFT HEART CATH AND CORS/GRAFTS ANGIOGRAPHY N/A 12/15/2017   Procedure: LEFT HEART CATH AND CORS/GRAFTS ANGIOGRAPHY;  Surgeon: Corky Crafts, MD;  Location: [facility] INVASIVE CV LAB;  Service: Cardiovascular;  Laterality: N/A;   LEFT HEART CATHETERIZATION WITH CORONARY/GRAFT ANGIOGRAM N/A 08/01/2012   Procedure: LEFT HEART CATHETERIZATION WITH Isabel Caprice;  Surgeon: Herby Abraham, MD;  Location: [facility] CATH LAB;  Service: Cardiovascular;  Laterality: N/A;   LEFT HEART CATHETERIZATION WITH CORONARY/GRAFT ANGIOGRAM N/A 01/03/2015   Procedure: LEFT HEART CATHETERIZATION WITH Isabel Caprice;  Surgeon: Runell Gess, MD;  Location: [facility] CATH LAB;  Service: Cardiovascular;  Laterality: N/A;   NASAL SEPTUM SURGERY     UP3   PERCUTANEOUS CORONARY STENT INTERVENTION (PCI-S)  10/06/2008   mRCA PCI  2.75 x 23 Abbott Xience V drug-eluting stent    RIGHT/LEFT HEART CATH AND CORONARY/GRAFT ANGIOGRAPHY N/A 01/07/2020   Procedure: RIGHT/LEFT HEART CATH AND CORONARY/GRAFT ANGIOGRAPHY;  Surgeon: Lyn Records, MD;  Location: [facility] INVASIVE CV LAB;  Service: Cardiovascular;  Laterality: N/A;   TOTAL KNEE ARTHROPLASTY Right 09/22/2021   Procedure: RIGHT TOTAL KNEE ARTHROPLASTY;  Surgeon: Marcene Corning, MD;  Location: [facility] ORS;  Service: Orthopedics;  Laterality: Right;   ULTRASOUND GUIDANCE FOR VASCULAR ACCESS  12/15/2017   Procedure: Ultrasound Guidance For Vascular Access;  Surgeon: Corky Crafts, MD;  Location:  [facility] INVASIVE CV LAB;  Service: Cardiovascular;;    Allergies  Allergies  Allergen Reactions   Morphine Other (See Comments)    Hallucinations   Shellfish-Derived Products Itching     History of Present Illness    [name] PUTNAM is a 77 y.o. male with PMH of CAD s/p CABG in 2000 with repeat cath in 2006 and 2009 with DES to distal RCA, DM type II, HTN, HLD RBBB, OSA, spondylosis, HFpEF, PAF (on Eliquis) who presents today for 52-month follow-up of coronary artery disease and AF.    [name] [name] has an extensive coronary history dating back to 2000 when he underwent CABG with subsequent left heart And 2015, 2016 and 2019 that showed stable coronaries.  In 2017 he suffered near syncope with transient confusion and completed a MRI of the head that was negative for stroke.  He also reported falling asleep easily and was diagnosed with narcolepsy.  He was started on Plavix and heart monitor was  worn that demonstrated atrial fibs/flutter and Plavix was transition to Eliquis.  He completed a 2D echo in 01/2020 that showed EF of 60 to 65%.  He reported to the ED 09/2022 with 3-week complaint of chest pain.  EKG was performed with no acute changes noted and delta troponins completed that were negative x 2.  He was sent for Lexiscan in the outpatient setting that showed no ischemia and was negative.  He was last seen by Dr. Eldridge Dace in 09/2022 for post ED follow-up.  During visit patient had ongoing chest pain and amlodipine was stopped and amiodarone was decreased to 200 mg daily.  He presented to the ED 01/05/2023 with complaint of exertional chest pain that was relieved with nitroglycerin.  He was transported to the hospital via EMS and troponins were found to be negative x 2.  EKG was also reassuring and patient had resolution of angina in the ED.  He was given advisement to follow-up with cardiology in the outpatient setting.    Since last being seen in the office patient reports***.  Patient denies  chest pain, palpitations, dyspnea, PND, orthopnea, nausea, vomiting, dizziness, syncope, edema, weight gain, or early satiety.   ***Notes:  Home Medications    Current Outpatient Medications  Medication Sig Dispense Refill   amiodarone (PACERONE) 200 MG tablet Take 1 tablet (200 mg total) by mouth daily. 90 tablet 3   amitriptyline (ELAVIL) 10 MG tablet Take 1-3 tablets (10-30 mg total) by mouth at bedtime. 90 tablet 1   apixaban (ELIQUIS) 5 MG TABS tablet Take 1 tablet (5 mg total) by mouth 2 (two) times daily. 60 tablet 5   aspirin EC 81 MG tablet Take 81 mg by mouth daily. Swallow whole.     atorvastatin (LIPITOR) 80 MG tablet Take 1 tablet (80 mg total) by mouth daily. 90 tablet 0   BESIVANCE 0.6 % SUSP Instill 1 drop into right eye three times a day as directed. Please store upside down!! 5 mL 1   BESIVANCE 0.6 % SUSP Instill 1 drop into left eye three times a day as directed 5 mL 1   Continuous Blood Gluc Sensor (FREESTYLE LIBRE 2 SENSOR) MISC Use as instructed to check blood sugar, change every 14 days *No further refills until appt is scheduled* 6 each 3   COVID-19 mRNA vaccine 2023-2024 (COMIRNATY) syringe Inject into the muscle. 0.3 mL 0   dapagliflozin propanediol (FARXIGA) 5 MG TABS tablet Take 1 tablet (5 mg total) by mouth daily before breakfast. 30 tablet 6   diclofenac (VOLTAREN) 75 MG EC tablet Take 1 tablet (75 mg total) by mouth 2 (two) times daily. 20 tablet 0   Diclofenac Sodium (PENNSAID) 2 % SOLN Place 1 application onto the skin 2 (two) times daily. 112 g 2   divalproex (DEPAKOTE) 500 MG DR tablet Take 1 tablet (500 mg total) by mouth 2 (two) times daily. 60 tablet 5   donepezil (ARICEPT) 10 MG tablet Take 1 tablet (10 mg total) by mouth at bedtime. 90 tablet 4   fenofibrate 160 MG tablet Take 1 tablet (160 mg total) by mouth daily. 30 tablet 0   glucose blood (ACCU-CHEK AVIVA) test strip Use as directed to check blood sugar 4 times daily.  DX E11.9 200 each 6    hydrocortisone 2.5 % cream Apply on to the skin 2 (two) times daily. 30 g 1   hydrOXYzine (ATARAX) 10 MG tablet Take 1 tablet (10 mg total) by mouth 3 (three) times  daily as needed for anxiety or itching. 30 tablet 1   influenza vaccine adjuvanted (FLUAD QUADRIVALENT) 0.5 ML injection Inject into the muscle. 0.5 mL 0   insulin glargine, 2 Unit Dial, (TOUJEO MAX SOLOSTAR) 300 UNIT/ML Solostar Pen Inject 60 Units into the skin daily in the afternoon. 30 mL 2   insulin lispro (HUMALOG KWIKPEN) 100 UNIT/ML KwikPen Inject 10 Units into the skin 3 (three) times daily. 45 mL 3   Insulin Pen Needle 31G X 5 MM MISC Use as directed to inject insulin in the morning, at noon, in the evening, and at bedtime. 400 each 3   isosorbide mononitrate (IMDUR) 120 MG 24 hr tablet Take 1 tablet (120 mg total) by mouth daily. 90 tablet 3   ketorolac (ACULAR) 0.5 % ophthalmic solution Place 1 drop into the right eye 4 times a day as directed 5 mL 1   ketorolac (ACULAR) 0.5 % ophthalmic solution Place 1 drop into the left eye 4 (four) times daily. 5 mL 1   levocetirizine (XYZAL) 5 MG tablet Take 1 tablet (5 mg total) by mouth every evening. 30 tablet 2   levothyroxine (SYNTHROID) 100 MCG tablet Take 1 tablet (100 mcg total) by mouth daily before breakfast. 90 tablet 0   meclizine (ANTIVERT) 25 MG tablet Take 1 tablet (25 mg total) by mouth 3 (three) times daily as needed for dizziness. 30 tablet 0   memantine (NAMENDA) 10 MG tablet Take 1 tablet (10 mg total) by mouth 2 (two) times daily. 60 tablet 11   nitroGLYCERIN (NITROSTAT) 0.4 MG SL tablet PLACE 1 TABLET UNDER THE TONGUE EVERY 5 MINUTES AS NEEDED FOR CHEST PAIN. 25 tablet 1   pantoprazole (PROTONIX) 40 MG tablet Take 1 tablet (40 mg total) by mouth daily. 90 tablet 1   pramipexole (MIRAPEX) 1 MG tablet Take 1/2 to 1 tablet (0.5-1 mg total) by mouth at bedtime. 90 tablet 0   QUEtiapine (SEROQUEL) 25 MG tablet Take 1 tablet (25 mg total) by mouth at bedtime. 30 tablet 6    ranolazine (RANEXA) 500 MG 12 hr tablet TAKE 1 TABLET BY MOUTH TWICE DAILY (Patient taking differently: Take 500 mg by mouth 2 (two) times daily.) 60 tablet 5   sertraline (ZOLOFT) 100 MG tablet Take 1 tablet (100 mg total) by mouth 2 (two) times daily. 180 tablet 0   triamcinolone cream (KENALOG) 0.1 % Apply 1 application topically 2 (two) times daily. Use as needed for itchy rash 30 g 1   No current facility-administered medications for this visit.     Review of Systems  Please see the history of present illness.    (+)*** (+)***  All other systems reviewed and are otherwise negative except as noted above.  Physical Exam    Wt Readings from Last 3 Encounters:  03/15/23 170 lb 8 oz (77.3 kg)  03/03/23 173 lb (78.5 kg)  01/27/23 176 lb (79.8 kg)   ZO:XWRUE were no vitals filed for this visit.,There is no height or weight on file to calculate BMI.  Constitutional:      Appearance: Healthy appearance. Not in distress.  Neck:     Vascular: JVD normal.  Pulmonary:     Effort: Pulmonary effort is normal.     Breath sounds: No wheezing. No rales. Diminished in the bases Cardiovascular:     Normal rate. Regular rhythm. Normal S1. Normal S2.      Murmurs: There is no murmur.  Edema:    Peripheral edema absent.  Abdominal:     Palpations: Abdomen is soft non tender. There is no hepatomegaly.  Skin:    General: Skin is warm and dry.  Neurological:     General: No focal deficit present.     Mental Status: Alert and oriented to person, place and time.     Cranial Nerves: Cranial nerves are intact.  EKG/LABS/ Recent Cardiac Studies    ECG personally reviewed by me today - ***  Cardiac Studies & Procedures   CARDIAC CATHETERIZATION  CARDIAC CATHETERIZATION 01/07/2020  Narrative  Right heart cath demonstrated normal filling pressures and pulmonary artery pressure.  Total occlusion of the mid LAD and diagonal.  Ramus intermedius is also totally occluded.  Mild to moderate  disease is noted in the distal circumflex.  The native right coronary is totally occluded beyond the origin of the PDA.  The bypass graft to the continuation of the right coronary is widely patent.  The bypass graft to the diagonal is widely patent.  The bypass graft to the ramus intermedius is widely patent.  LIMA to the LAD is widely patent.  Left ventricular function was not assessed with this study.  Left ventricular end-diastolic pressure was normal.  RECOMMENDATIONS:   In review of images, no significant change has occurred compared to 2019.  If angina, consider microvascular origin.  Uncertain explanation for the patient's dyspnea.  Findings Coronary Findings Diagnostic  Dominance: Right  Left Anterior Descending Prox LAD lesion is 90% stenosed. The lesion is segmental and chronically occluded.  First Diagonal Branch Vessel is angiographically normal. Ost 1st Diag lesion is 100% stenosed. The lesion is located at the major branch and discrete.  Ramus Intermedius Vessel is small. Ost Ramus to Ramus lesion is 100% stenosed.  Left Circumflex Prox Cx lesion is 20% stenosed. The lesion is discrete. Mid Cx lesion is 30% stenosed. The lesion is located at the major branch and discrete.  First Obtuse Marginal Branch Vessel is small in size.  Right Coronary Artery Mid RCA to Dist RCA lesion is 5% stenosed. The lesion was previously treatedover 2 years ago. Nov 2009  Right Posterior Atrioventricular Artery Vessel is moderate in size. RPAV lesion is 100% stenosed.  First Right Posterolateral Branch Vessel is small in size.  Second Right Posterolateral Branch Vessel is small in size.  Third Right Posterolateral Branch Vessel is small in size.  LIMA LIMA Graft To Dist LAD LIMA and is normal in caliber. Small downstream distal LAD - minimal CAD  Single Graft Graft To RPAV and is normal in caliber.  Graft To 1st Diag  Graft To Ramus  Intervention  No  interventions have been documented.   STRESS TESTS  NM MYOCAR MULTI W/SPECT W 02/14/2022  Narrative CLINICAL DATA:  Nonspecific chest pain.  EXAM: MYOCARDIAL IMAGING WITH SPECT (REST AND PHARMACOLOGIC-STRESS)  GATED LEFT VENTRICULAR WALL MOTION STUDY  LEFT VENTRICULAR EJECTION FRACTION  TECHNIQUE: Standard myocardial SPECT imaging was performed after resting intravenous injection of 9.8 mCi Tc-57m tetrofosmin. Subsequently, intravenous infusion of Lexiscan was performed under the supervision of the Cardiology staff. At peak effect of the drug, 32.7 mCi Tc-44m tetrofosmin was injected intravenously and standard myocardial SPECT imaging was performed. Quantitative gated imaging was also performed to evaluate left ventricular wall motion, and estimate left ventricular ejection fraction.  COMPARISON:  None.  FINDINGS: Perfusion: No decreased activity in the left ventricle on stress imaging to suggest reversible ischemia or infarction.  Wall Motion: Normal left ventricular wall motion. No left ventricular dilation.  Left Ventricular Ejection Fraction: 58 %  End diastolic volume 84 ml  End systolic volume 36 ml  IMPRESSION: 1. No reversible ischemia or infarction.  2. Normal left ventricular wall motion.  3. Left ventricular ejection fraction 58%  4. Non invasive risk stratification*: Low  *2012 Appropriate Use Criteria for Coronary Revascularization Focused Update: J Am Coll Cardiol. 2012;59(9):857-881. http://content.dementiazones.com.aspx?articleid=1201161   Electronically Signed By: Jeronimo Greaves [name] On: 02/14/2022 14:58   ECHOCARDIOGRAM  ECHOCARDIOGRAM COMPLETE 02/13/2022  Narrative ECHOCARDIOGRAM REPORT    Patient Name:   CAMDON BOCHENEK Date of Exam: 02/13/2022 Medical Rec #:  [id number]         Height:       69.0 in Accession #:    [id number]        Weight:       190.0 lb Date of Birth:  [birth year]         BSA:          2.021 m Patient Age:     77 years          BP:           122/54 mmHg Patient Gender: M                 HR:           55 bpm. Exam Location:  Inpatient  Procedure: 2D Echo, Cardiac Doppler, Color Doppler and Strain Analysis  Indications:    Chest Pain R07.9  History:        Patient has prior history of Echocardiogram examinations, most recent 01/08/2020. CAD, Prior CABG, TIA and Carotid Disease, Arrythmias:RBBB, Atrial Fibrillation and Bradycardia, Signs/Symptoms:Murmur; Risk Factors:Diabetes, Hypertension, Dyslipidemia and Sleep Apnea. Chronic kidney disease.  Sonographer:    Leta Jungling RDCS Referring Phys: [id number] Angie Fava  IMPRESSIONS   1. Left ventricular ejection fraction, by estimation, is 60 to 65%. The left ventricle has normal function. The left ventricle has no regional wall motion abnormalities. Left ventricular diastolic parameters were normal. The average left ventricular global longitudinal strain is -19.9 %. The global longitudinal strain is normal. 2. Right ventricular systolic function is normal. The right ventricular size is normal. 3. Left atrial size was mildly dilated. 4. The mitral valve is normal in structure. Trivial mitral valve regurgitation. No evidence of mitral stenosis. 5. The aortic valve is tricuspid. Aortic valve regurgitation is not visualized. No aortic stenosis is present. 6. The inferior vena cava is normal in size with greater than 50% respiratory variability, suggesting right atrial pressure of 3 mmHg.  Comparison(s): No significant change from prior study.  Conclusion(s)/Recommendation(s): Otherwise normal echocardiogram, with minor abnormalities described in the report.  FINDINGS Left Ventricle: Left ventricular ejection fraction, by estimation, is 60 to 65%. The left ventricle has normal function. The left ventricle has no regional wall motion abnormalities. The average left ventricular global longitudinal strain is -19.9 %. The global longitudinal  strain is normal. The left ventricular internal cavity size was normal in size. There is no left ventricular hypertrophy. Left ventricular diastolic parameters were normal.  Right Ventricle: The right ventricular size is normal. No increase in right ventricular wall thickness. Right ventricular systolic function is normal.  Left Atrium: Left atrial size was mildly dilated.  Right Atrium: Right atrial size was normal in size.  Pericardium: There is no evidence of pericardial effusion.  Mitral Valve: The mitral valve is normal in structure. Trivial mitral valve regurgitation. No evidence of mitral valve stenosis.  Tricuspid Valve: The  tricuspid valve is normal in structure. Tricuspid valve regurgitation is trivial. No evidence of tricuspid stenosis.  Aortic Valve: The aortic valve is tricuspid. Aortic valve regurgitation is not visualized. No aortic stenosis is present.  Pulmonic Valve: The pulmonic valve was not well visualized. Pulmonic valve regurgitation is not visualized. No evidence of pulmonic stenosis.  Aorta: The aortic root, ascending aorta and aortic arch are all structurally normal, with no evidence of dilitation or obstruction.  Venous: The inferior vena cava is normal in size with greater than 50% respiratory variability, suggesting right atrial pressure of 3 mmHg.  IAS/Shunts: The atrial septum is grossly normal.   LEFT VENTRICLE PLAX 2D LVIDd:         4.70 cm   Diastology LVIDs:         2.90 cm   LV e' medial:    6.75 cm/s LV PW:         1.00 cm   LV E/e' medial:  10.9 LV IVS:        1.10 cm   LV e' lateral:   10.80 cm/s LVOT diam:     2.00 cm   LV E/e' lateral: 6.8 LV SV:         63 LV SV Index:   31        2D Longitudinal Strain LVOT Area:     3.14 cm  2D Strain GLS (A2C):   -17.7 % 2D Strain GLS (A3C):   -19.8 % 2D Strain GLS (A4C):   -22.1 % 2D Strain GLS Avg:     -19.9 %  RIGHT VENTRICLE RV S prime:     10.30 cm/s TAPSE (M-mode): 1.8 cm  LEFT ATRIUM              Index        RIGHT ATRIUM           Index LA diam:        4.30 cm 2.13 cm/m   RA Area:     16.30 cm LA Vol (A2C):   57.1 ml 28.25 ml/m  RA Volume:   35.00 ml  17.31 ml/m LA Vol (A4C):   56.3 ml 27.85 ml/m LA Biplane Vol: 59.8 ml 29.58 ml/m AORTIC VALVE LVOT Vmax:   84.00 cm/s LVOT Vmean:  54.200 cm/s LVOT VTI:    0.201 m  AORTA Ao Root diam: 3.40 cm Ao Asc diam:  3.00 cm  MITRAL VALVE MV Area (PHT): 3.21 cm    SHUNTS MV Decel Time: 236 msec    Systemic VTI:  0.20 m MV E velocity: 73.90 cm/s  Systemic Diam: 2.00 cm MV A velocity: 59.20 cm/s MV E/A ratio:  1.25  Jodelle Red MD Electronically signed by Jodelle Red MD Signature Date/Time: 02/13/2022/1:34:33 PM    Final    MONITORS  CARDIAC EVENT MONITOR 08/14/2020           Risk Assessment/Calculations:   {Does this patient have ATRIAL FIBRILLATION?:[phone]}        Lab Results  Component Value Date   WBC 9.9 01/05/2023   HGB 13.7 01/05/2023   HCT 40.3 01/05/2023   MCV 90.2 01/05/2023   PLT 170 01/05/2023   Lab Results  Component Value Date   CREATININE 1.60 (H) 01/05/2023   BUN 26 (H) 01/05/2023   NA 137 01/05/2023   K 4.1 01/05/2023   CL 104 01/05/2023   CO2 25 01/05/2023   Lab Results  Component Value Date   ALT 31 11/12/2022  AST 27 11/12/2022   ALKPHOS 95 11/12/2022   BILITOT 0.6 11/12/2022   Lab Results  Component Value Date   CHOL 95 11/12/2022   HDL 41.00 11/12/2022   LDLCALC 37 11/12/2022   LDLDIRECT 77.0 08/20/2019   TRIG 81.0 11/12/2022   CHOLHDL 2 11/12/2022    Lab Results  Component Value Date   HGBA1C 9.3 (A) 03/03/2023     Assessment & Plan    1.  History of CAD/chest pain: -s/p CABG in 2000 with subsequent PCI/DES to RCA in 2009.  Patient had Lexiscan Myoview completed in March 2023 that was normal.  He was seen recently 01/05/2023 in the ED with complaint of chest pain that was relieved with nitroglycerin. -Today patient reports***   2.   Paroxysmal atrial fibrillation: -Patient currently rate controlled with amiodarone 200 mg daily -Continue Eliquis***   3.  Essential hypertension: -Patient's blood pressure today was***   4.  Hyperlipidemia: -Patient's most recent LDL was*** Continue***   5.  DM type II: -Patient's last hemoglobin A1c was***      Disposition: Follow-up with Lance Muss, MD or APP in *** months {Are you ordering a CV Procedure (e.g. stress test, cath, DCCV, TEE, etc)?   Press F2        :[id number]}   Medication Adjustments/Labs and Tests Ordered: Current medicines are reviewed at length with the patient today.  Concerns regarding medicines are outlined above.   Signed, Napoleon Form, Leodis Rains, NP 04/11/2023, 4:44 PM [facility]

## 2023-04-12 ENCOUNTER — Other Ambulatory Visit: Payer: Self-pay

## 2023-04-12 ENCOUNTER — Other Ambulatory Visit (HOSPITAL_BASED_OUTPATIENT_CLINIC_OR_DEPARTMENT_OTHER): Payer: Self-pay

## 2023-04-13 ENCOUNTER — Telehealth: Payer: Self-pay

## 2023-04-13 ENCOUNTER — Ambulatory Visit: Payer: Medicare Other | Attending: Interventional Cardiology | Admitting: Nurse Practitioner

## 2023-04-13 DIAGNOSIS — Z7982 Long term (current) use of aspirin: Secondary | ICD-10-CM | POA: Diagnosis not present

## 2023-04-13 DIAGNOSIS — I480 Paroxysmal atrial fibrillation: Secondary | ICD-10-CM

## 2023-04-13 DIAGNOSIS — G4733 Obstructive sleep apnea (adult) (pediatric): Secondary | ICD-10-CM | POA: Diagnosis not present

## 2023-04-13 DIAGNOSIS — E559 Vitamin D deficiency, unspecified: Secondary | ICD-10-CM | POA: Diagnosis not present

## 2023-04-13 DIAGNOSIS — D509 Iron deficiency anemia, unspecified: Secondary | ICD-10-CM | POA: Diagnosis not present

## 2023-04-13 DIAGNOSIS — G2581 Restless legs syndrome: Secondary | ICD-10-CM | POA: Diagnosis not present

## 2023-04-13 DIAGNOSIS — M47812 Spondylosis without myelopathy or radiculopathy, cervical region: Secondary | ICD-10-CM | POA: Diagnosis not present

## 2023-04-13 DIAGNOSIS — Z7901 Long term (current) use of anticoagulants: Secondary | ICD-10-CM | POA: Diagnosis not present

## 2023-04-13 DIAGNOSIS — F0283 Dementia in other diseases classified elsewhere, unspecified severity, with mood disturbance: Secondary | ICD-10-CM | POA: Diagnosis not present

## 2023-04-13 DIAGNOSIS — Z794 Long term (current) use of insulin: Secondary | ICD-10-CM | POA: Diagnosis not present

## 2023-04-13 DIAGNOSIS — E785 Hyperlipidemia, unspecified: Secondary | ICD-10-CM

## 2023-04-13 DIAGNOSIS — D631 Anemia in chronic kidney disease: Secondary | ICD-10-CM | POA: Diagnosis not present

## 2023-04-13 DIAGNOSIS — N183 Chronic kidney disease, stage 3 unspecified: Secondary | ICD-10-CM | POA: Diagnosis not present

## 2023-04-13 DIAGNOSIS — I129 Hypertensive chronic kidney disease with stage 1 through stage 4 chronic kidney disease, or unspecified chronic kidney disease: Secondary | ICD-10-CM | POA: Diagnosis not present

## 2023-04-13 DIAGNOSIS — I471 Supraventricular tachycardia, unspecified: Secondary | ICD-10-CM | POA: Diagnosis not present

## 2023-04-13 DIAGNOSIS — I4510 Unspecified right bundle-branch block: Secondary | ICD-10-CM | POA: Diagnosis not present

## 2023-04-13 DIAGNOSIS — F32A Depression, unspecified: Secondary | ICD-10-CM | POA: Diagnosis not present

## 2023-04-13 DIAGNOSIS — I2510 Atherosclerotic heart disease of native coronary artery without angina pectoris: Secondary | ICD-10-CM | POA: Diagnosis not present

## 2023-04-13 DIAGNOSIS — E1122 Type 2 diabetes mellitus with diabetic chronic kidney disease: Secondary | ICD-10-CM | POA: Diagnosis not present

## 2023-04-13 DIAGNOSIS — E039 Hypothyroidism, unspecified: Secondary | ICD-10-CM | POA: Diagnosis not present

## 2023-04-13 DIAGNOSIS — H9190 Unspecified hearing loss, unspecified ear: Secondary | ICD-10-CM | POA: Diagnosis not present

## 2023-04-13 DIAGNOSIS — K219 Gastro-esophageal reflux disease without esophagitis: Secondary | ICD-10-CM | POA: Diagnosis not present

## 2023-04-13 DIAGNOSIS — F0284 Dementia in other diseases classified elsewhere, unspecified severity, with anxiety: Secondary | ICD-10-CM | POA: Diagnosis not present

## 2023-04-13 DIAGNOSIS — I10 Essential (primary) hypertension: Secondary | ICD-10-CM

## 2023-04-13 NOTE — Telephone Encounter (Signed)
Faxed order to [facility] for [facility] certification and plan of care

## 2023-04-14 ENCOUNTER — Encounter: Payer: Self-pay | Admitting: Nurse Practitioner

## 2023-04-18 ENCOUNTER — Other Ambulatory Visit: Payer: Self-pay

## 2023-04-18 ENCOUNTER — Other Ambulatory Visit (HOSPITAL_BASED_OUTPATIENT_CLINIC_OR_DEPARTMENT_OTHER): Payer: Self-pay

## 2023-04-19 ENCOUNTER — Other Ambulatory Visit (HOSPITAL_BASED_OUTPATIENT_CLINIC_OR_DEPARTMENT_OTHER): Payer: Self-pay

## 2023-04-20 ENCOUNTER — Other Ambulatory Visit (HOSPITAL_BASED_OUTPATIENT_CLINIC_OR_DEPARTMENT_OTHER): Payer: Self-pay

## 2023-04-20 DIAGNOSIS — M47812 Spondylosis without myelopathy or radiculopathy, cervical region: Secondary | ICD-10-CM | POA: Diagnosis not present

## 2023-04-20 DIAGNOSIS — H9190 Unspecified hearing loss, unspecified ear: Secondary | ICD-10-CM | POA: Diagnosis not present

## 2023-04-20 DIAGNOSIS — F0284 Dementia in other diseases classified elsewhere, unspecified severity, with anxiety: Secondary | ICD-10-CM | POA: Diagnosis not present

## 2023-04-20 DIAGNOSIS — G4733 Obstructive sleep apnea (adult) (pediatric): Secondary | ICD-10-CM | POA: Diagnosis not present

## 2023-04-20 DIAGNOSIS — Z7901 Long term (current) use of anticoagulants: Secondary | ICD-10-CM | POA: Diagnosis not present

## 2023-04-20 DIAGNOSIS — E039 Hypothyroidism, unspecified: Secondary | ICD-10-CM | POA: Diagnosis not present

## 2023-04-20 DIAGNOSIS — Z7982 Long term (current) use of aspirin: Secondary | ICD-10-CM | POA: Diagnosis not present

## 2023-04-20 DIAGNOSIS — I4510 Unspecified right bundle-branch block: Secondary | ICD-10-CM | POA: Diagnosis not present

## 2023-04-20 DIAGNOSIS — F32A Depression, unspecified: Secondary | ICD-10-CM | POA: Diagnosis not present

## 2023-04-20 DIAGNOSIS — I129 Hypertensive chronic kidney disease with stage 1 through stage 4 chronic kidney disease, or unspecified chronic kidney disease: Secondary | ICD-10-CM | POA: Diagnosis not present

## 2023-04-20 DIAGNOSIS — N183 Chronic kidney disease, stage 3 unspecified: Secondary | ICD-10-CM | POA: Diagnosis not present

## 2023-04-20 DIAGNOSIS — D509 Iron deficiency anemia, unspecified: Secondary | ICD-10-CM | POA: Diagnosis not present

## 2023-04-20 DIAGNOSIS — Z794 Long term (current) use of insulin: Secondary | ICD-10-CM | POA: Diagnosis not present

## 2023-04-20 DIAGNOSIS — E785 Hyperlipidemia, unspecified: Secondary | ICD-10-CM | POA: Diagnosis not present

## 2023-04-20 DIAGNOSIS — K219 Gastro-esophageal reflux disease without esophagitis: Secondary | ICD-10-CM | POA: Diagnosis not present

## 2023-04-20 DIAGNOSIS — G2581 Restless legs syndrome: Secondary | ICD-10-CM | POA: Diagnosis not present

## 2023-04-20 DIAGNOSIS — I471 Supraventricular tachycardia, unspecified: Secondary | ICD-10-CM | POA: Diagnosis not present

## 2023-04-20 DIAGNOSIS — F0283 Dementia in other diseases classified elsewhere, unspecified severity, with mood disturbance: Secondary | ICD-10-CM | POA: Diagnosis not present

## 2023-04-20 DIAGNOSIS — D631 Anemia in chronic kidney disease: Secondary | ICD-10-CM | POA: Diagnosis not present

## 2023-04-20 DIAGNOSIS — I480 Paroxysmal atrial fibrillation: Secondary | ICD-10-CM | POA: Diagnosis not present

## 2023-04-20 DIAGNOSIS — E559 Vitamin D deficiency, unspecified: Secondary | ICD-10-CM | POA: Diagnosis not present

## 2023-04-20 DIAGNOSIS — E1122 Type 2 diabetes mellitus with diabetic chronic kidney disease: Secondary | ICD-10-CM | POA: Diagnosis not present

## 2023-04-20 DIAGNOSIS — I2510 Atherosclerotic heart disease of native coronary artery without angina pectoris: Secondary | ICD-10-CM | POA: Diagnosis not present

## 2023-04-24 DIAGNOSIS — L259 Unspecified contact dermatitis, unspecified cause: Secondary | ICD-10-CM | POA: Diagnosis not present

## 2023-04-27 ENCOUNTER — Telehealth: Payer: Self-pay | Admitting: Neurology

## 2023-04-27 DIAGNOSIS — E559 Vitamin D deficiency, unspecified: Secondary | ICD-10-CM | POA: Diagnosis not present

## 2023-04-27 DIAGNOSIS — F32A Depression, unspecified: Secondary | ICD-10-CM | POA: Diagnosis not present

## 2023-04-27 DIAGNOSIS — D631 Anemia in chronic kidney disease: Secondary | ICD-10-CM | POA: Diagnosis not present

## 2023-04-27 DIAGNOSIS — I129 Hypertensive chronic kidney disease with stage 1 through stage 4 chronic kidney disease, or unspecified chronic kidney disease: Secondary | ICD-10-CM | POA: Diagnosis not present

## 2023-04-27 DIAGNOSIS — I480 Paroxysmal atrial fibrillation: Secondary | ICD-10-CM | POA: Diagnosis not present

## 2023-04-27 DIAGNOSIS — I2510 Atherosclerotic heart disease of native coronary artery without angina pectoris: Secondary | ICD-10-CM | POA: Diagnosis not present

## 2023-04-27 DIAGNOSIS — N183 Chronic kidney disease, stage 3 unspecified: Secondary | ICD-10-CM | POA: Diagnosis not present

## 2023-04-27 DIAGNOSIS — E1122 Type 2 diabetes mellitus with diabetic chronic kidney disease: Secondary | ICD-10-CM | POA: Diagnosis not present

## 2023-04-27 DIAGNOSIS — G4733 Obstructive sleep apnea (adult) (pediatric): Secondary | ICD-10-CM | POA: Diagnosis not present

## 2023-04-27 DIAGNOSIS — E785 Hyperlipidemia, unspecified: Secondary | ICD-10-CM | POA: Diagnosis not present

## 2023-04-27 DIAGNOSIS — Z7901 Long term (current) use of anticoagulants: Secondary | ICD-10-CM | POA: Diagnosis not present

## 2023-04-27 DIAGNOSIS — D509 Iron deficiency anemia, unspecified: Secondary | ICD-10-CM | POA: Diagnosis not present

## 2023-04-27 DIAGNOSIS — K219 Gastro-esophageal reflux disease without esophagitis: Secondary | ICD-10-CM | POA: Diagnosis not present

## 2023-04-27 DIAGNOSIS — I471 Supraventricular tachycardia, unspecified: Secondary | ICD-10-CM | POA: Diagnosis not present

## 2023-04-27 DIAGNOSIS — Z7982 Long term (current) use of aspirin: Secondary | ICD-10-CM | POA: Diagnosis not present

## 2023-04-27 DIAGNOSIS — H9190 Unspecified hearing loss, unspecified ear: Secondary | ICD-10-CM | POA: Diagnosis not present

## 2023-04-27 DIAGNOSIS — I4510 Unspecified right bundle-branch block: Secondary | ICD-10-CM | POA: Diagnosis not present

## 2023-04-27 DIAGNOSIS — G2581 Restless legs syndrome: Secondary | ICD-10-CM | POA: Diagnosis not present

## 2023-04-27 DIAGNOSIS — R2689 Other abnormalities of gait and mobility: Secondary | ICD-10-CM

## 2023-04-27 DIAGNOSIS — R2681 Unsteadiness on feet: Secondary | ICD-10-CM

## 2023-04-27 DIAGNOSIS — F0284 Dementia in other diseases classified elsewhere, unspecified severity, with anxiety: Secondary | ICD-10-CM | POA: Diagnosis not present

## 2023-04-27 DIAGNOSIS — M47812 Spondylosis without myelopathy or radiculopathy, cervical region: Secondary | ICD-10-CM | POA: Diagnosis not present

## 2023-04-27 DIAGNOSIS — W19XXXD Unspecified fall, subsequent encounter: Secondary | ICD-10-CM

## 2023-04-27 DIAGNOSIS — F0283 Dementia in other diseases classified elsewhere, unspecified severity, with mood disturbance: Secondary | ICD-10-CM | POA: Diagnosis not present

## 2023-04-27 DIAGNOSIS — E039 Hypothyroidism, unspecified: Secondary | ICD-10-CM | POA: Diagnosis not present

## 2023-04-27 DIAGNOSIS — Z794 Long term (current) use of insulin: Secondary | ICD-10-CM | POA: Diagnosis not present

## 2023-04-27 NOTE — Telephone Encounter (Signed)
[name] @ [facility] has called to provide Pt update. Pt's heart rate is low, no sign of distress. Start was 53 end was 56.  Pt would like a Rolator.  [name] [name] believes he would benefit from one also. [name] [name] also states that pt informed her that his dog somehow chewed up the pin/syringe to prevent pt from being able to properly check his blood sugars, he is unable to find his sensor.  [name] [name] said pt did not inform her of how long it has been since he has been able to check his blood sugars. His care giving status is: he has neighbors that check in on him daily. She is unsure if daughter lives with him or not,she will look into that further.  [name] [name] also said he is fairly safe. [name] [name] can be reached at [phone] secure vm

## 2023-04-27 NOTE — Telephone Encounter (Signed)
We can send him a rolator

## 2023-04-27 NOTE — Telephone Encounter (Signed)
Caroline from [facility] called back because she forgot to report a fall the pt had. She states that the pt informed her that a couple of weeks ago he fell in the driveway but had no injuries. She states that this is just an FYI and if she does not hear back they will continue as normal as tolerated.

## 2023-04-28 ENCOUNTER — Telehealth: Payer: Self-pay | Admitting: Neurology

## 2023-04-28 ENCOUNTER — Telehealth: Payer: Self-pay

## 2023-04-28 ENCOUNTER — Other Ambulatory Visit (HOSPITAL_COMMUNITY): Payer: Self-pay

## 2023-04-28 NOTE — Addendum Note (Signed)
Addended byWindell Norfolk on: 04/28/2023 09:39 AM   Modules accepted: Orders

## 2023-04-28 NOTE — Telephone Encounter (Signed)
Done. Thanks.

## 2023-04-28 NOTE — Telephone Encounter (Signed)
Attempted to call patient on 5/22. No answer and left message to return call. Spoke with Rayfield Citizen at [facility] and she suggests patient would benefit from rolator. Discussed with Dr. Teresa Coombs and fax sen to [facility] with rolator order

## 2023-04-28 NOTE — Telephone Encounter (Signed)
Misty Stanley called from [facility]. Stated they do not supply rolator order.

## 2023-04-28 NOTE — Telephone Encounter (Signed)
Caroline,PT @ [facility] reports that pt told her of a rash that he had on arm that was clearing up.  Rayfield Citizen says she did not see much of anything, and that pt was reluctant to allow her to exam arm.Pt also mentioned a rash on thigh which Rayfield Citizen was unable to confirm.  Rayfield Citizen ,PT can be reached at [phone] vm secure

## 2023-04-28 NOTE — Telephone Encounter (Signed)
Community order message to order Rolator placed

## 2023-04-28 NOTE — Addendum Note (Signed)
Addended by: Lenn Cal on: 04/28/2023 09:39 AM   Modules accepted: Orders

## 2023-04-28 NOTE — Telephone Encounter (Signed)
Returned call to [name] and she stated that the pt is at home right now. I stated that she should contact his primary care in regards to rash. She voiced understanding.

## 2023-04-29 ENCOUNTER — Other Ambulatory Visit (HOSPITAL_BASED_OUTPATIENT_CLINIC_OR_DEPARTMENT_OTHER): Payer: Self-pay

## 2023-04-29 ENCOUNTER — Encounter: Payer: Self-pay | Admitting: Family Medicine

## 2023-04-29 ENCOUNTER — Ambulatory Visit (INDEPENDENT_AMBULATORY_CARE_PROVIDER_SITE_OTHER): Payer: Medicare Other | Admitting: Family Medicine

## 2023-04-29 VITALS — BP 127/51 | HR 52 | Ht 68.0 in | Wt 169.0 lb

## 2023-04-29 DIAGNOSIS — R21 Rash and other nonspecific skin eruption: Secondary | ICD-10-CM

## 2023-04-29 LAB — CBC WITH DIFFERENTIAL/PLATELET
Basophils Absolute: 0 10*3/uL (ref 0.0–0.1)
Basophils Relative: 0.2 % (ref 0.0–3.0)
Eosinophils Absolute: 0 10*3/uL (ref 0.0–0.7)
Eosinophils Relative: 0 % (ref 0.0–5.0)
HCT: 41.5 % (ref 39.0–52.0)
Hemoglobin: 14.1 g/dL (ref 13.0–17.0)
Lymphocytes Relative: 8.8 % — ABNORMAL LOW (ref 12.0–46.0)
Lymphs Abs: 1 10*3/uL (ref 0.7–4.0)
MCHC: 33.9 g/dL (ref 30.0–36.0)
MCV: 91.5 fl (ref 78.0–100.0)
Monocytes Absolute: 0.4 10*3/uL (ref 0.1–1.0)
Monocytes Relative: 3.9 % (ref 3.0–12.0)
Neutro Abs: 9.9 10*3/uL — ABNORMAL HIGH (ref 1.4–7.7)
Neutrophils Relative %: 87.1 % — ABNORMAL HIGH (ref 43.0–77.0)
Platelets: 213 10*3/uL (ref 150.0–400.0)
RBC: 4.54 Mil/uL (ref 4.22–5.81)
RDW: 13.9 % (ref 11.5–15.5)
WBC: 11.4 10*3/uL — ABNORMAL HIGH (ref 4.0–10.5)

## 2023-04-29 LAB — COMPREHENSIVE METABOLIC PANEL
ALT: 27 U/L (ref 0–53)
AST: 19 U/L (ref 0–37)
Albumin: 4.2 g/dL (ref 3.5–5.2)
Alkaline Phosphatase: 95 U/L (ref 39–117)
BUN: 36 mg/dL — ABNORMAL HIGH (ref 6–23)
CO2: 27 mEq/L (ref 19–32)
Calcium: 9.4 mg/dL (ref 8.4–10.5)
Chloride: 98 mEq/L (ref 96–112)
Creatinine, Ser: 1.52 mg/dL — ABNORMAL HIGH (ref 0.40–1.50)
GFR: 44.82 mL/min — ABNORMAL LOW (ref >60.00)
Glucose, Bld: 371 mg/dL — ABNORMAL HIGH (ref 70–99)
Potassium: 4.7 mEq/L (ref 3.5–5.1)
Sodium: 135 mEq/L (ref 135–145)
Total Bilirubin: 0.7 mg/dL (ref 0.2–1.2)
Total Protein: 6.8 g/dL (ref 6.0–8.3)

## 2023-04-29 NOTE — Progress Notes (Signed)
   Acute Office Visit  Subjective:     Patient ID: [name] [name], male    DOB: 1946/08/31, 77 y.o.   MRN: [id number]  Chief Complaint  Patient presents with   Rash    HPI Patient is in today for rash.    Patient states he was recently at urgent care at started prednisone for possible allergic reaction to Tide detergent he used 1-2 weeks ago. States he has noticed some mild improvement with the prednisone. However, on further discussion, he reports that the rash has actually been going on for several months, with no known changes to daily routine/products/foods since that time. Rash is red, raised, itchy. He is using CeraVe anti-itch cream which helps. Daughter pulled provider aside and discussed concerns about the cleanliness of his house and reports that he recently rescued 4 dogs, one of which just had several puppies. She is trying to start getting his house cleaned up and find other homes for the animals. He denies any new sneezing, coughing, rhinorrhea, itchy/watery eyes, fevers, chills, body aches, signs of infection.       ROS All review of systems negative except what is listed in the HPI      Objective:    BP (!) 127/51   Pulse (!) 52   Ht 5\' 8"  (1.727 m)   Wt 169 lb (76.7 kg)   SpO2 100%   BMI 25.70 kg/m    Physical Exam Vitals reviewed.  Constitutional:      Appearance: Normal appearance. He is not ill-appearing.  Pulmonary:     Effort: Pulmonary effort is normal. No respiratory distress.  Skin:    Findings: Rash present.     Comments: Erythematous maculopapular rash to left upper leg, right arm, entire back. Some areas are scabbed from scratching. No areas of induration/fluctuance, drainage, warmth.   Neurological:     Mental Status: He is alert and oriented to person, place, and time.  Psychiatric:        Mood and Affect: Mood normal.        Behavior: Behavior normal.        Thought Content: Thought content normal.        Judgment: Judgment normal.      No results found for any visits on 04/29/23.      Assessment & Plan:   Problem List Items Addressed This Visit   None Visit Diagnoses     Rash    -  Primary Consider a contact dermatitis given presentation. Continue prednisone. Keep areas clean. Can use topical hydrocortisone cream as needed for itching.  Labs today and referral to dermatology given how long this rash has been going on.  Try not to scratch. If you develop any open areas, you can use some neosporin.     Relevant Orders   Ambulatory referral to Dermatology   CBC w/Diff   Comp Met (CMET)       No orders of the defined types were placed in this encounter.   Return if symptoms worsen or fail to improve.  Clayborne Dana, NP

## 2023-04-29 NOTE — Patient Instructions (Signed)
Labs today and referral to dermatology given how long this rash has been going on.  Try not to scratch. If you develop any open areas, you can use some neosporin.

## 2023-04-30 ENCOUNTER — Other Ambulatory Visit: Payer: Self-pay | Admitting: Family Medicine

## 2023-04-30 NOTE — Addendum Note (Signed)
Addended by: Hyman Hopes B on: 04/30/2023 11:13 PM   Modules accepted: Orders

## 2023-05-02 ENCOUNTER — Other Ambulatory Visit: Payer: Self-pay

## 2023-05-03 ENCOUNTER — Other Ambulatory Visit (HOSPITAL_BASED_OUTPATIENT_CLINIC_OR_DEPARTMENT_OTHER): Payer: Self-pay

## 2023-05-03 MED ORDER — HYDROXYZINE HCL 10 MG PO TABS
10.0000 mg | ORAL_TABLET | Freq: Three times a day (TID) | ORAL | 1 refills | Status: DC | PRN
  Filled 2023-05-03: qty 30, 10d supply, fill #0
  Filled 2023-06-02: qty 30, 10d supply, fill #1

## 2023-05-05 ENCOUNTER — Other Ambulatory Visit (HOSPITAL_BASED_OUTPATIENT_CLINIC_OR_DEPARTMENT_OTHER): Payer: Self-pay

## 2023-05-06 ENCOUNTER — Other Ambulatory Visit (HOSPITAL_BASED_OUTPATIENT_CLINIC_OR_DEPARTMENT_OTHER): Payer: Self-pay

## 2023-05-13 ENCOUNTER — Other Ambulatory Visit (HOSPITAL_BASED_OUTPATIENT_CLINIC_OR_DEPARTMENT_OTHER): Payer: Self-pay

## 2023-05-13 ENCOUNTER — Encounter: Payer: Self-pay | Admitting: Internal Medicine

## 2023-05-13 ENCOUNTER — Ambulatory Visit: Payer: Medicare Other | Admitting: Internal Medicine

## 2023-05-13 VITALS — BP 132/72 | HR 70

## 2023-05-13 DIAGNOSIS — E1142 Type 2 diabetes mellitus with diabetic polyneuropathy: Secondary | ICD-10-CM

## 2023-05-13 DIAGNOSIS — E1165 Type 2 diabetes mellitus with hyperglycemia: Secondary | ICD-10-CM

## 2023-05-13 DIAGNOSIS — E1122 Type 2 diabetes mellitus with diabetic chronic kidney disease: Secondary | ICD-10-CM | POA: Diagnosis not present

## 2023-05-13 DIAGNOSIS — Z794 Long term (current) use of insulin: Secondary | ICD-10-CM

## 2023-05-13 DIAGNOSIS — N1831 Chronic kidney disease, stage 3a: Secondary | ICD-10-CM

## 2023-05-13 DIAGNOSIS — E1159 Type 2 diabetes mellitus with other circulatory complications: Secondary | ICD-10-CM

## 2023-05-13 LAB — POCT GLYCOSYLATED HEMOGLOBIN (HGB A1C): Hemoglobin A1C: 10.6 % — AB (ref 4.0–5.6)

## 2023-05-13 MED ORDER — FREESTYLE LIBRE 3 SENSOR MISC
1.0000 | 3 refills | Status: DC
  Filled 2023-05-13 – 2023-05-17 (×3): qty 6, 84d supply, fill #0
  Filled 2023-05-18: qty 6, fill #0
  Filled 2023-05-18 – 2023-07-07 (×3): qty 6, 84d supply, fill #0
  Filled 2023-09-09: qty 2, 28d supply, fill #1
  Filled 2023-09-09: qty 6, 84d supply, fill #1
  Filled 2023-09-09: qty 2, 28d supply, fill #1
  Filled 2023-10-07: qty 2, 28d supply, fill #2
  Filled 2023-11-05: qty 2, 28d supply, fill #3
  Filled 2023-12-06: qty 2, 28d supply, fill #4
  Filled 2023-12-15: qty 1, 14d supply, fill #5
  Filled 2023-12-27: qty 2, 28d supply, fill #6
  Filled 2024-01-13: qty 2, 28d supply, fill #7
  Filled 2024-01-24: qty 2, 28d supply, fill #8
  Filled 2024-02-27: qty 2, 28d supply, fill #9

## 2023-05-13 MED ORDER — DAPAGLIFLOZIN PROPANEDIOL 10 MG PO TABS
10.0000 mg | ORAL_TABLET | Freq: Every day | ORAL | 3 refills | Status: DC
  Filled 2023-05-13: qty 30, 30d supply, fill #0
  Filled 2023-06-06: qty 30, 30d supply, fill #1
  Filled 2023-07-11 – 2023-07-28 (×2): qty 30, 30d supply, fill #2
  Filled 2023-08-29: qty 30, 30d supply, fill #3
  Filled 2023-10-03: qty 30, 30d supply, fill #4
  Filled 2023-10-31: qty 30, 30d supply, fill #5
  Filled 2023-12-05: qty 30, 30d supply, fill #6
  Filled 2023-12-28 – 2024-01-05 (×4): qty 30, 30d supply, fill #7
  Filled 2024-01-18 – 2024-01-31 (×2): qty 30, 30d supply, fill #8
  Filled 2024-02-09 – 2024-02-27 (×2): qty 30, 30d supply, fill #9
  Filled 2024-03-21 – 2024-03-22 (×2): qty 30, 30d supply, fill #10
  Filled 2024-04-10 – 2024-04-20 (×3): qty 30, 30d supply, fill #11

## 2023-05-13 NOTE — Progress Notes (Signed)
Name: [name] [name]  Age/ Sex: 77 y.o., male   MRN/ DOB: [id number], [birth year]     PCP: Bradd Canary, MD   Reason for Endocrinology Evaluation: Type 2 Diabetes Mellitus  Initial Endocrine Consultative Visit: 11/21/2017    PATIENT IDENTIFIER: [name] [name] is a 77 y.o. male with a past medical history of T2DM, HTN, CAD,Hypothyroidism CHF and OSA. The patient has followed with Endocrinology clinic since 11/21/2017 for consultative assistance with management of his diabetes.   On his initia visit with me 04/2022 he was on NPH only with an A1c 8.7 % . He declined SGLT-2i and opted for prandial insulin . We restarted toujeo and stopped NPH  DIABETIC HISTORY:  [name] [name] was diagnosed with DM in 1989.He was on Metformin at some point.  His hemoglobin A1c has ranged from 6.4% in 2022, peaking at 8.7% in 2023.   SUBJECTIVE:   During the last visit (03/02/2022): A1c  9.3%  Today (05/13/2023): [name] [name] is here for follow-up on diabetes management. He is accompanied by his daughter today [name]).  He typically comes in with [facility], but [name] was recently involved in his care, he does have a third daughter who manages his medication.  He checks his blood sugars multiple  times daily through CGM. The patient has had hypoglycemic episodes since the last clinic visit, patient is symptomatic with these episodes.   He continues to follow-up with neurology for mild cognitive impairment, mild dementia and gait instability, undergoing PT Denies nausea, vomiting Denies constipation or diarrhea  Has been under the weather lately   HOME DIABETES REGIMEN:  Farxiga 5 mg daily Toujeo 60 units daily  Humalog 12 units TID QAC Correction Factor : HUmalog (BG-130/25)      Statin: yes ACE-I/ARB: no      CONTINUOUS GLUCOSE MONITORING RECORD INTERPRETATION      Dates of Recording: 5/25-05/13/2023  Sensor description: [employer] 2  Results statistics:   CGM use % of  time 44  Average and SD 266/44.2  Time in range  29    %  % Time Above 180 17  % Time above 250 52  % Time Below target 2   Glycemic patterns summary: BG's trend down overnight, and increased through the day  Hyperglycemic episodes all day  Hypoglycemic episodes occurred overnight  Overnight periods: Trends down       DIABETIC COMPLICATIONS: Microvascular complications:  Neuropathy, CKD III Denies:  Last Eye Exam: Completed 10/22/2022  Macrovascular complications:  CAD, PVD Denies: CVA   HISTORY:  Past Medical History:  Past Medical History:  Diagnosis Date   Acquired atrophy of thyroid 06/05/2013   Overview:  July 2014: controlled on synthroid since 2012 May 2015: decreased to Synthroid 50 mcg  Last Assessment & Plan:  Pt doing well with most recent TSH within normal range.  Will continue current dosage of Synthroid . Pt reminded to take medication on empty stomach. Will continue to monitor with periodic laboratory assessment in 3 months.    Anemia    hemoglobin 7.4, iron deficiency, January, 2011, 2 unit transfusion, endoscopy normal, capsule endoscopy February, 2011 no small bowel abnormalities.   Most likely source gastric erosions, followed by GI   Anxiety    Bradycardia    CAD (coronary artery disease)    A. CABG in 2000,status post cardiac cath in 2006, 2009 ....continued chest pain and SOB despite oral medication adjestments including Ranexa. B. Cath November 2009/ mRCA - 2.75 x  23 Abbott Xience V drug-eluting stent ...11/26/2008 to distal  RCA leading to acute marginal.  C. Cath 07/2012 for CP - stable anatomy, med rx. d. cath 2015 and 05/30/2015 stable anatomy, consider Myoview if has CP again   Carotid artery disease (HCC) 08/01/2015   Doppler, May 29, 2015, 1-39% bilateral ICA    Cerebral ischemia    MRI November, 2010, chronic microvascular ischemia   CKD (chronic kidney disease), stage III (HCC)    Concussion    Depression    Bipolar    Diabetes mellitus (HCC)    Edema    Essential hypertension 06/05/2013   Overview:  July 2014: Controlled with Bystolic Sep 2015: Changed to atenolol.  Last Assessment & Plan:  Will change to atenolol for cost and follow Improved.  Medication compliance strongly encouraged BP: 116/64 mmHg    Overview:  July 2014: Controlled with Bystolic Sep 2015: Changed to atenolol.  Last Assessment & Plan:  Will change to atenolol for cost and follow   Falling episodes    these have occurred in the past and again recurring 2011   Family history of adverse reaction to anesthesia    "mother died during bypass surgery but not sure if it has to do with anesthesia"   Gastric ulcer    GERD (gastroesophageal reflux disease)    H/O medication noncompliance    Due to loss of insurance   H/O multiple concussions    Hard of hearing    Heart murmur    History of blood transfusion 12/20/2013   History of kidney stones    Hx of CABG    2000,  / one median sternotomy suture broken her chest x-ray November, 2010, no clinical significance   Hyperlipidemia    Hypertension    pt. denies   Iron deficiency anemia    Long term (current) use of anticoagulants [Z79.01] 07/20/2016   Low back pain 06/12/2009   Qualifier: Diagnosis of  By: Nena Jordan    Nephrolithiasis    Orthostasis    OSA (obstructive sleep apnea)    PAF (paroxysmal atrial fibrillation) (HCC)    a. dx 2017, started on amiodarone/warfarin but patient intermittently noncompliant.   PSVT (paroxysmal supraventricular tachycardia)    RBBB 07/09/2009   Qualifier: Diagnosis of  By: Myrtis Ser, MD, [facility], Lemmie Evens    RLS (restless legs syndrome) 09/19/2009   Qualifier: Diagnosis of  By: Nena Jordan   Overview:  July 2014: Controlled with Mirapex  Last Assessment & Plan:  Patient is doing well. Will continue current management and follow clinically.   Seizure disorder (HCC) 01/09/2017   Spondylosis    C5-6, C6-7 MRI 2010   Syncope 03/2016   TBI  (traumatic brain injury) (HCC) 2019   Thyroid disease    Tubulovillous adenoma of colon 2007   Vitamin D deficiency 05/17/2017   Wears glasses    Past Surgical History:  Past Surgical History:  Procedure Laterality Date   ANTERIOR CERVICAL DECOMP/DISCECTOMY FUSION N/A 05/31/2016   Procedure: ANTERIOR CERVICAL DECOMPRESSION/DISCECTOMY FUSION CERVICAL FIVE-SIX,CERVICAL SIX-SEVEN;  Surgeon: Julio Sicks, MD;  Location: [facility] NEURO ORS;  Service: Neurosurgery;  Laterality: N/A;   CARDIAC CATHETERIZATION N/A 05/30/2015   Procedure: Left Heart Cath and Coronary Angiography;  Surgeon: Marykay Lex, MD;  Location: [facility] INVASIVE CV LAB;  Service: Cardiovascular;  Laterality: N/A;   CATARACT EXTRACTION     COLONOSCOPY     CORONARY ARTERY BYPASS GRAFT     2000  ESOPHAGOGASTRODUODENOSCOPY     LEFT HEART CATH AND CORS/GRAFTS ANGIOGRAPHY N/A 12/15/2017   Procedure: LEFT HEART CATH AND CORS/GRAFTS ANGIOGRAPHY;  Surgeon: Corky Crafts, MD;  Location: [facility] INVASIVE CV LAB;  Service: Cardiovascular;  Laterality: N/A;   LEFT HEART CATHETERIZATION WITH CORONARY/GRAFT ANGIOGRAM N/A 08/01/2012   Procedure: LEFT HEART CATHETERIZATION WITH Isabel Caprice;  Surgeon: Herby Abraham, MD;  Location: [facility] CATH LAB;  Service: Cardiovascular;  Laterality: N/A;   LEFT HEART CATHETERIZATION WITH CORONARY/GRAFT ANGIOGRAM N/A 01/03/2015   Procedure: LEFT HEART CATHETERIZATION WITH Isabel Caprice;  Surgeon: Runell Gess, MD;  Location: [facility] CATH LAB;  Service: Cardiovascular;  Laterality: N/A;   NASAL SEPTUM SURGERY     UP3   PERCUTANEOUS CORONARY STENT INTERVENTION (PCI-S)  10/06/2008   mRCA PCI  2.75 x 23 Abbott Xience V drug-eluting stent    RIGHT/LEFT HEART CATH AND CORONARY/GRAFT ANGIOGRAPHY N/A 01/07/2020   Procedure: RIGHT/LEFT HEART CATH AND CORONARY/GRAFT ANGIOGRAPHY;  Surgeon: Lyn Records, MD;  Location: [facility] INVASIVE CV LAB;  Service: Cardiovascular;  Laterality: N/A;   TOTAL KNEE  ARTHROPLASTY Right 09/22/2021   Procedure: RIGHT TOTAL KNEE ARTHROPLASTY;  Surgeon: Marcene Corning, MD;  Location: [facility] ORS;  Service: Orthopedics;  Laterality: Right;   ULTRASOUND GUIDANCE FOR VASCULAR ACCESS  12/15/2017   Procedure: Ultrasound Guidance For Vascular Access;  Surgeon: Corky Crafts, MD;  Location: [facility] INVASIVE CV LAB;  Service: Cardiovascular;;   Social History:  reports that he has never smoked. He has never been exposed to tobacco smoke. He has never used smokeless tobacco. He reports that he does not drink alcohol and does not use drugs. Family History:  Family History  Problem Relation Age of Onset   Pancreatic cancer Brother    Diabetes Brother    Coronary artery disease Brother    Stroke Brother    Diabetes Brother    Diabetes Mother    Heart failure Mother    Heart failure Father    Hypothyroidism Brother    Coronary artery disease Brother    Other Brother        colon surgery   Heart attack Other        Nephew   Irregular heart beat Daughter    Cancer Maternal Grandmother        unknown    Colon cancer Neg Hx    Stomach cancer Neg Hx    Liver cancer Neg Hx    Rectal cancer Neg Hx    Esophageal cancer Neg Hx      HOME MEDICATIONS: Allergies as of 05/13/2023       Reactions   Morphine Other (See Comments)   Hallucinations   Shellfish-derived Products Itching        Medication List        Accurate as of May 13, 2023 11:00 AM. If you have any questions, ask your nurse or doctor.          amiodarone 200 MG tablet Commonly known as: PACERONE Take 1 tablet (200 mg total) by mouth daily.   amitriptyline 10 MG tablet Commonly known as: ELAVIL Take 1-3 tablets (10-30 mg total) by mouth at bedtime.   aspirin EC 81 MG tablet Take 81 mg by mouth daily. Swallow whole.   atorvastatin 80 MG tablet Commonly known as: LIPITOR Take 1 tablet (80 mg total) by mouth daily.   Besivance 0.6 % Susp Generic drug: Besifloxacin HCl Instill 1  drop into right eye three times a day as directed.  Please store upside down!!   Besivance 0.6 % Susp Generic drug: Besifloxacin HCl Instill 1 drop into left eye three times a day as directed   Comirnaty syringe Generic drug: COVID-19 mRNA vaccine 2023-2024 Inject into the muscle.   dapagliflozin propanediol 10 MG Tabs tablet Commonly known as: Farxiga Take 1 tablet (10 mg total) by mouth daily. What changed:  medication strength how much to take when to take this Changed by: Scarlette Shorts, MD   diclofenac 75 MG EC tablet Commonly known as: VOLTAREN Take 1 tablet (75 mg total) by mouth 2 (two) times daily.   divalproex 500 MG DR tablet Commonly known as: Depakote Take 1 tablet (500 mg total) by mouth 2 (two) times daily.   donepezil 10 MG tablet Commonly known as: ARICEPT Take 1 tablet (10 mg total) by mouth at bedtime.   Eliquis 5 MG Tabs tablet Generic drug: apixaban Take 1 tablet (5 mg total) by mouth 2 (two) times daily.   fenofibrate 160 MG tablet Take 1 tablet (160 mg total) by mouth daily.   FreeStyle Libre 3 Sensor Misc Place 1 sensor on the skin every 14 days. Use to check glucose continuously What changed: how to take this Changed by: Scarlette Shorts, MD   glucose blood test strip Commonly known as: Accu-Chek Aviva Use as directed to check blood sugar 4 times daily.  DX E11.9   hydrocortisone 2.5 % cream Apply on to the skin 2 (two) times daily.   hydrOXYzine 10 MG tablet Commonly known as: ATARAX Take 1 tablet (10 mg total) by mouth 3 (three) times daily as needed for anxiety or itching.   insulin lispro 100 UNIT/ML KwikPen Commonly known as: HumaLOG KwikPen Inject 10 Units into the skin 3 (three) times daily.   Insulin Pen Needle 31G X 5 MM Misc Use as directed to inject insulin in the morning, at noon, in the evening, and at bedtime.   isosorbide mononitrate 120 MG 24 hr tablet Commonly known as: IMDUR Take 1 tablet (120 mg total)  by mouth daily.   ketorolac 0.5 % ophthalmic solution Commonly known as: ACULAR Place 1 drop into the right eye 4 times a day as directed   ketorolac 0.5 % ophthalmic solution Commonly known as: ACULAR Place 1 drop into the left eye 4 (four) times daily.   levocetirizine 5 MG tablet Commonly known as: XYZAL Take 1 tablet (5 mg total) by mouth every evening.   levothyroxine 100 MCG tablet Commonly known as: SYNTHROID Take 1 tablet (100 mcg total) by mouth daily before breakfast.   meclizine 25 MG tablet Commonly known as: ANTIVERT Take 1 tablet (25 mg total) by mouth 3 (three) times daily as needed for dizziness.   memantine 10 MG tablet Commonly known as: Namenda Take 1 tablet (10 mg total) by mouth 2 (two) times daily.   nitroGLYCERIN 0.4 MG SL tablet Commonly known as: NITROSTAT PLACE 1 TABLET UNDER THE TONGUE EVERY 5 MINUTES AS NEEDED FOR CHEST PAIN.   pantoprazole 40 MG tablet Commonly known as: PROTONIX Take 1 tablet (40 mg total) by mouth daily.   Pennsaid 2 % Soln Generic drug: diclofenac Sodium Place 1 application onto the skin 2 (two) times daily.   pramipexole 1 MG tablet Commonly known as: Mirapex Take 1/2 to 1 tablet (0.5-1 mg total) by mouth at bedtime.   predniSONE 10 MG (48) Tbpk tablet Commonly known as: STERAPRED UNI-PAK 48 TAB Take 1 tablet by mouth as directed.  QUEtiapine 25 MG tablet Commonly known as: SEROquel Take 1 tablet (25 mg total) by mouth at bedtime.   ranolazine 500 MG 12 hr tablet Commonly known as: RANEXA TAKE 1 TABLET BY MOUTH TWICE DAILY What changed: how much to take   sertraline 100 MG tablet Commonly known as: ZOLOFT Take 1 tablet (100 mg total) by mouth 2 (two) times daily.   Toujeo Max SoloStar 300 UNIT/ML Solostar Pen Generic drug: insulin glargine (2 Unit Dial) Inject 60 Units into the skin daily in the afternoon.   triamcinolone cream 0.1 % Commonly known as: KENALOG Apply 1 application topically 2 (two)  times daily. Use as needed for itchy rash         OBJECTIVE:   Vital Signs: BP 132/72 (BP Location: Right Arm, Patient Position: Sitting, Cuff Size: Normal)   Pulse 70   Wt Readings from Last 3 Encounters:  04/29/23 169 lb (76.7 kg)  03/15/23 170 lb 8 oz (77.3 kg)  03/03/23 173 lb (78.5 kg)     Exam: General: Pt appears well and is in NAD  Lungs: Clear with good BS bilat  Heart: RRR   Abdomen:  soft, nontender  Extremities: No  pretibial edema.   Neuro: MS is good with appropriate affect, pt is alert and Ox3     DM foot exam 03/03/2023  The skin of the feet is intact without sores or ulcerations. The pedal pulses are 2+ on right and 2+ on left. The sensation is decreased   to a screening 5.07, 10 gram monofilament bilaterally      DATA REVIEWED:  Lab Results  Component Value Date   HGBA1C 10.6 (A) 05/13/2023   HGBA1C 9.3 (A) 03/03/2023   HGBA1C 8.6 (H) 11/12/2022    Latest Reference Range & Units 04/29/23 09:21  Sodium 135 - 145 mEq/L 135  Potassium 3.5 - 5.1 mEq/L 4.7  Chloride 96 - 112 mEq/L 98  CO2 19 - 32 mEq/L 27  Glucose 70 - 99 mg/dL 962 (H)  BUN 6 - 23 mg/dL 36 (H)  Creatinine 9.52 - 1.50 mg/dL 8.41 (H)  Calcium 8.4 - 10.5 mg/dL 9.4  Alkaline Phosphatase 39 - 117 U/L 95  Albumin 3.5 - 5.2 g/dL 4.2  AST 0 - 37 U/L 19  ALT 0 - 53 U/L 27  Total Protein 6.0 - 8.3 g/dL 6.8  (H): Data is abnormally high  Old records , labs and images have been reviewed.    ASSESSMENT / PLAN / RECOMMENDATIONS:   1) Type 2 Diabetes Mellitus, poorly controlled, With neuropathic, CKD III and macrovascular complications - Most recent A1c of 10.6%. Goal A1c <7.0%.    -Patient continues with poorly controlled diabetes -His A1c has worsened from 9.3% to 10.6%, he did receive glucocorticoid courses, but historically he has been forgetting to take prandial insulin, patient does state that he has been consistently taking Humalog, but in reviewing his CGM data, the patient  has been noted with severe hyperglycemia during the day, and at times there is evidence of prandial intake which results in hypoglycemia -I have also discussed switching Humalog to either sulfonylurea or repaglinide, to see if he would do better, his other daughter has been managing his pillbox, but there is no supervision for insulin intake -At this time, we have opted to remain on the current regimen and [name] and his other daughters will monitor intake and proceed from there -He continues to use higher doses than previously prescribed of Toujeo, I have advised  him again to reduce it as below -I will increase Marcelline Deist -The patient does have unsteady gait but this has been prior to starting [place]    MEDICATIONS: Increase Farxiga 10 mg daily Decrease Toujeo 60 units ONCE daily  Continue Humalog 12 units TIDQAC Continue correction Factor : HUmalog (BG -130/25) 3 times daily before every meal  EDUCATION / INSTRUCTIONS: BG monitoring instructions: Patient is instructed to check his blood sugars 3 times a day, before meals . Call [facility] if: BG persistently < 70  I reviewed the Rule of 15 for the treatment of hypoglycemia in detail with the patient. Literature supplied.   2) Diabetic complications:  Eye: Does not have known diabetic retinopathy.  Neuro/ Feet: Does  have known diabetic peripheral neuropathy .  Renal: Patient does  have known baseline CKD. He   is not on an ACEI/ARB at present.      F/U in 3 months    I spent 30 minutes preparing to see the patient by review of recent labs, imaging and procedures, obtaining and reviewing separately obtained history, communicating with the patient/family or caregiver, ordering medications, tests or procedures, and documenting clinical information in the EHR including the differential Dx, treatment, and any further evaluation and other management   Signed electronically by: Lyndle Herrlich, MD  [facility]  Endocrinology  [facility] [address] [place], [place] [address] Phone: [phone] FAX: [phone]   CC: Bradd Canary, MD [address] [place] [address] Phone: [phone]  Fax: [phone]  Return to Endocrinology clinic as below: Future Appointments  Date Time Provider Department Center  06/03/2023  2:40 PM Elenore Paddy, NP [facility] None  06/22/2023 10:05 AM Sharlene Dory, PA-C [facility]  09/21/2023  3:45 PM Windell Norfolk, MD [facility] None  02/02/2024 11:15 AM Windell Norfolk, MD [facility] None

## 2023-05-13 NOTE — Patient Instructions (Signed)
-   Increase  Farxiga 10 mg , 1 tablet every morning  - Decrease  Toujeo 60 units ONCE daily  - Continue  Humalog 12 units with each meal ( breakfast, lunch and supper) -Humalog correctional insulin: ADD extra units on insulin to your meal-time Humalog dose if your blood sugars are higher than 150. Use the scale below to help guide you:   Blood sugar before meal Number of units to inject  Less than 155 0 unit  156 - 180 1 units  181 -  205 2 units  206 -  230 3 units  231 -  255 4 units  256 -  280 5 units  281 -  305 6 units  306 -  330 7 units  331 -  355 8 units  356 - 380 9 units    HOW TO TREAT LOW BLOOD SUGARS (Blood sugar LESS THAN 70 MG/DL) Please follow the RULE OF 15 for the treatment of hypoglycemia treatment (when your (blood sugars are less than 70 mg/dL)   STEP 1: Take 15 grams of carbohydrates when your blood sugar is low, which includes:  3-4 GLUCOSE TABS  OR 3-4 OZ OF JUICE OR REGULAR SODA OR ONE TUBE OF GLUCOSE GEL    STEP 2: RECHECK blood sugar in 15 MINUTES STEP 3: If your blood sugar is still low at the 15 minute recheck --> then, go back to STEP 1 and treat AGAIN with another 15 grams of carbohydrates.

## 2023-05-16 ENCOUNTER — Other Ambulatory Visit (HOSPITAL_BASED_OUTPATIENT_CLINIC_OR_DEPARTMENT_OTHER): Payer: Self-pay

## 2023-05-16 ENCOUNTER — Encounter: Payer: Self-pay | Admitting: Internal Medicine

## 2023-05-17 ENCOUNTER — Other Ambulatory Visit (HOSPITAL_BASED_OUTPATIENT_CLINIC_OR_DEPARTMENT_OTHER): Payer: Self-pay

## 2023-05-18 ENCOUNTER — Ambulatory Visit (INDEPENDENT_AMBULATORY_CARE_PROVIDER_SITE_OTHER): Payer: Medicare Other | Admitting: Family Medicine

## 2023-05-18 ENCOUNTER — Ambulatory Visit: Payer: Medicare Other | Admitting: Medical

## 2023-05-18 ENCOUNTER — Other Ambulatory Visit (HOSPITAL_BASED_OUTPATIENT_CLINIC_OR_DEPARTMENT_OTHER): Payer: Self-pay

## 2023-05-18 ENCOUNTER — Encounter: Payer: Self-pay | Admitting: Family Medicine

## 2023-05-18 ENCOUNTER — Other Ambulatory Visit: Payer: Self-pay | Admitting: Family Medicine

## 2023-05-18 VITALS — BP 115/62 | HR 76 | Wt 165.0 lb

## 2023-05-18 DIAGNOSIS — R2681 Unsteadiness on feet: Secondary | ICD-10-CM | POA: Insufficient documentation

## 2023-05-18 DIAGNOSIS — R944 Abnormal results of kidney function studies: Secondary | ICD-10-CM | POA: Diagnosis not present

## 2023-05-18 DIAGNOSIS — R21 Rash and other nonspecific skin eruption: Secondary | ICD-10-CM | POA: Diagnosis not present

## 2023-05-18 DIAGNOSIS — F0390 Unspecified dementia without behavioral disturbance: Secondary | ICD-10-CM

## 2023-05-18 MED ORDER — NITROGLYCERIN 0.4 MG SL SUBL
SUBLINGUAL_TABLET | SUBLINGUAL | 1 refills | Status: DC
  Filled 2023-05-18: qty 25, 8d supply, fill #0
  Filled 2023-06-17: qty 25, 8d supply, fill #1

## 2023-05-18 NOTE — Progress Notes (Signed)
Acute Office Visit  Subjective:     Patient ID: [name] [name], male    DOB: 11-24-1946, 77 y.o.   MRN: [id number]  Chief Complaint  Patient presents with   Gait Problem    HPI Patient is in today for gait instability. He is here with his daughter.    Discussed the use of AI scribe software for clinical note transcription with the patient, who gave verbal consent to proceed.  History of Present Illness   The patient, a known case of dementia, hypertension, and diabetes, presents with a chief complaint of increasing weakness and dizziness over the past few weeks. The patient reports difficulty walking and maintaining balance, often veering off to the right and feeling as if he is about to fall. The patient's legs feel weak and unsteady, and he reports a sensation of dizziness that affects his entire body, not just his head. The patient has had to resort to sitting down immediately when these episodes occur, as he no longer trusts himself to walk a good distance. The patient's daughter reports that the patient's symptoms seemed to worsen significantly a few weeks ago, after a period of relative stability and improvement with physical therapy. The patient also reports increased irritability and forgetfulness, including misplacing items shortly after setting them down. Daughter notes some possible discrepancies in making his pill box, unsure if he may have been taking too much Namenda and Imdur lately - she is going to go over there and check the cabinets to confirm and let us know.      They have been following with neurology and are on a cancellation list for a sooner follow-up appointment.           ROS All review of systems negative except what is listed in the HPI      Objective:    BP 115/62   Pulse 76   Wt 165 lb (74.8 kg)   SpO2 98%   BMI 25.09 kg/m    Physical Exam Vitals reviewed.  Constitutional:      Appearance: Normal appearance.  Cardiovascular:      Rate and Rhythm: Normal rate and regular rhythm.     Pulses: Normal pulses.     Heart sounds: Normal heart sounds.  Pulmonary:     Effort: Pulmonary effort is normal.     Breath sounds: Normal breath sounds.  Skin:    General: Skin is warm and dry.  Neurological:     Mental Status: He is alert and oriented to person, place, and time.  Psychiatric:        Mood and Affect: Mood normal.        Behavior: Behavior normal.        Thought Content: Thought content normal.        Judgment: Judgment normal.     No results found for any visits on 05/18/23.      Assessment & Plan:   Problem List Items Addressed This Visit     Gait instability - Primary    Advised they check pill boxes/bottles closely when they get home and ensure that he is taking medications as prescribed.  Slight drop in BP with position changes - discussed staying hydrated, changing positions slowly, ensure we are not taking double medications, monitor BP at home. Scheduled to see cardiology in a few weeks, but if low readings at home, let us know so we can see about lowering dosages. Will try to get home health and [facility]  care management on board to help Discussed safety       Relevant Orders   Ambulatory referral to Home Health   Ambulatory referral to Pulmonology   AMB Referral to [facility] Coordinaton (ACO Patients)   Dementia without behavioral disturbance, psychotic disturbance, mood disturbance, or anxiety (HCC)    Same as above      Relevant Orders   Ambulatory referral to Home Health   Ambulatory referral to Pulmonology   AMB Referral to [facility] Coordinaton (ACO Patients)   No orders of the defined types were placed in this encounter.   Return for first available with Dr. Abner Greenspan.  Clayborne Dana, NP

## 2023-05-18 NOTE — Assessment & Plan Note (Signed)
Same as above

## 2023-05-18 NOTE — Assessment & Plan Note (Addendum)
Advised they check pill boxes/bottles closely when they get home and ensure that he is taking medications as prescribed.  Slight drop in BP with position changes - discussed staying hydrated, changing positions slowly, ensure we are not taking double medications, monitor BP at home. Scheduled to see cardiology in a few weeks, but if low readings at home, let us know so we can see about lowering dosages. Will try to get home health and [facility] care management on board to help Discussed safety

## 2023-05-19 ENCOUNTER — Telehealth: Payer: Self-pay | Admitting: *Deleted

## 2023-05-19 LAB — CBC WITH DIFFERENTIAL/PLATELET
Basophils Absolute: 0.1 10*3/uL (ref 0.0–0.1)
Basophils Relative: 1 % (ref 0.0–3.0)
Eosinophils Absolute: 0.3 10*3/uL (ref 0.0–0.7)
Eosinophils Relative: 4.9 % (ref 0.0–5.0)
HCT: 40.3 % (ref 39.0–52.0)
Hemoglobin: 13.7 g/dL (ref 13.0–17.0)
Lymphocytes Relative: 16.5 % (ref 12.0–46.0)
Lymphs Abs: 1.2 10*3/uL (ref 0.7–4.0)
MCHC: 34.1 g/dL (ref 30.0–36.0)
MCV: 90.6 fl (ref 78.0–100.0)
Monocytes Absolute: 0.4 10*3/uL (ref 0.1–1.0)
Monocytes Relative: 6.3 % (ref 3.0–12.0)
Neutro Abs: 5.1 10*3/uL (ref 1.4–7.7)
Neutrophils Relative %: 71.3 % (ref 43.0–77.0)
Platelets: 169 10*3/uL (ref 150.0–400.0)
RBC: 4.45 Mil/uL (ref 4.22–5.81)
RDW: 13.3 % (ref 11.5–15.5)
WBC: 7.1 10*3/uL (ref 4.0–10.5)

## 2023-05-19 LAB — BASIC METABOLIC PANEL
BUN: 27 mg/dL — ABNORMAL HIGH (ref 6–23)
CO2: 26 mEq/L (ref 19–32)
Calcium: 8.8 mg/dL (ref 8.4–10.5)
Chloride: 101 mEq/L (ref 96–112)
Creatinine, Ser: 1.59 mg/dL — ABNORMAL HIGH (ref 0.40–1.50)
GFR: 42.45 mL/min — ABNORMAL LOW (ref >60.00)
Glucose, Bld: 139 mg/dL — ABNORMAL HIGH (ref 70–99)
Potassium: 4.4 mEq/L (ref 3.5–5.1)
Sodium: 135 mEq/L (ref 135–145)

## 2023-05-19 NOTE — Addendum Note (Signed)
Addended by: Hyman Hopes B on: 05/19/2023 05:29 PM   Modules accepted: Orders

## 2023-05-19 NOTE — Progress Notes (Signed)
  Care Coordination  Outreach Note  05/19/2023 Name: [name] [name] MRN: [id number] DOB: 12-Jun-1946   Care Coordination Outreach Attempts: An unsuccessful telephone outreach was attempted today to offer the patient information about available care coordination services.  Follow Up Plan:  Additional outreach attempts will be made to offer the patient care coordination information and services.   Encounter Outcome:  No Answer  Burman Nieves, CCMA Care Coordination Care Guide Direct Dial: [phone]

## 2023-05-23 ENCOUNTER — Other Ambulatory Visit: Payer: Self-pay

## 2023-05-23 NOTE — Progress Notes (Signed)
  Care Coordination   Note   05/23/2023 Name: [name] MRN: [id number] DOB: 08/28/1946  [name] [name] is a 77 y.o. year old male who sees Bradd Canary, MD for primary care. I reached out to Arcelia Jew by phone today to offer care coordination services.  [name] was given information about Care Coordination services today including:   The Care Coordination services include support from the care team which includes your Nurse Coordinator, Clinical Social Worker, or Pharmacist.  The Care Coordination team is here to help remove barriers to the health concerns and goals most important to you. Care Coordination services are voluntary, and the patient may decline or stop services at any time by request to their care team member.   Care Coordination Consent Status: Patient agreed to services and verbal consent obtained.   Follow up plan:  Telephone appointment with care coordination team member scheduled for:  05/24/2023 and 05/31/2023  Encounter Outcome:  Pt. Scheduled from referral   Burman Nieves, [facility] Care Coordination Care Guide Direct Dial: [phone]

## 2023-05-24 ENCOUNTER — Other Ambulatory Visit: Payer: Self-pay

## 2023-05-24 ENCOUNTER — Ambulatory Visit: Payer: Self-pay | Admitting: Licensed Clinical Social Worker

## 2023-05-24 DIAGNOSIS — R2681 Unsteadiness on feet: Secondary | ICD-10-CM

## 2023-05-24 NOTE — Patient Instructions (Signed)
Social Work Visit Information  Thank you for taking time to visit with me today. Please don't hesitate to contact me if I can be of assistance to you.   Following are the goals we discussed today:   Goals Addressed             This Visit's Progress    Caregiver support       Activities and task to complete in order to accomplish goals.   Keep all upcoming appointment discussed today Continue with compliance of taking medication prescribed by Doctor Caregiver resources mailed to daughter          Our next appointment is by telephone on 05/30/23 at 2:00   Please call the care guide team at [phone] if you need to cancel or reschedule your appointment.   If you or anyone you know are experiencing a Mental Health or Behavioral Health Crisis or need someone to talk to, please call the Suicide and Crisis Lifeline: 988 call the [place] National Suicide Prevention Lifeline: (684) 527-6290 or TTY: (308) 556-5926 TTY (217)817-8307) to talk to a trained counselor call 1-800-273-TALK (toll free, 24 hour hotline)   Patient's dau. verbalizes understanding of instructions and care plan provided today and agrees to view in MyChart. Active MyChart status and patient understanding of how to access instructions and care plan via MyChart confirmed with patient.     Sammuel Hines, LCSW Social Work Care Coordination  [facility] [employer] [employer] [phone]

## 2023-05-24 NOTE — Patient Outreach (Signed)
  Care Coordination  Initial Visit Note   05/24/2023 Name: AREG CHEY MRN: [id number] DOB: 26-Dec-1945  Nicole Kindred Streeper is a 77 y.o. year old male who sees Bradd Canary, MD for primary care. I spoke with  Nicole Kindred Dayley's dau. by phone today.  What matters to the patients health and wellness today?    Provided caregiver support and resources during this encounter.   Goals Addressed             This Visit's Progress    Caregiver support       Activities and task to complete in order to accomplish goals.   Keep all upcoming appointment discussed today Continue with compliance of taking medication prescribed by Doctor Caregiver resources mailed to daughter         SDOH assessments and interventions completed:  Yes  SDOH Interventions Today    Flowsheet Row Most Recent Value  SDOH Interventions   Food Insecurity Interventions Intervention Not Indicated  Housing Interventions Intervention Not Indicated  Transportation Interventions Intervention Not Indicated  Utilities Interventions Intervention Not Indicated  Financial Strain Interventions Intervention Not Indicated       Care Coordination Interventions:  Yes, provided  Interventions Today    Flowsheet Row Most Recent Value  Chronic Disease   Chronic disease during today's visit Hypertension (HTN), Congestive Heart Failure (CHF), Diabetes  General Interventions   General Interventions Discussed/Reviewed General Interventions Reviewed, Level of Care, Durable Medical Equipment (DME)  [reviewed care coordination program]  Durable Medical Equipment (DME) Dorita Sciara dau. walker is ordered]  Education Interventions   Education Provided Provided Education  Provided Verbal Education On [employer]  Mental Health Interventions   Mental Health Discussed/Reviewed Other  [caregiver stress / assessing needs ( active listening, solution focused, and problem solving]  Safety Interventions   Safety  Discussed/Reviewed Home Safety  [will start PT on 05/25/23]  Advanced Directive Interventions   Advanced Directives Discussed/Reviewed Advanced Directives Discussed  [does not have one in place]       Follow up plan: Follow up call scheduled for 05/30/23 with patient    Encounter Outcome:  Pt. Visit Completed   Sammuel Hines, LCSW Social Work Care Coordination  [facility] [employer] [phone]

## 2023-05-25 ENCOUNTER — Telehealth: Payer: Self-pay | Admitting: Family Medicine

## 2023-05-25 NOTE — Telephone Encounter (Signed)
Caller/Agency: [facility] ([place])  Callback Number: [phone]  Requesting OT/PT/Skilled Nursing/Social Work/Speech Therapy: PT Pt was not feeling well so they are requesting start of care to start on Friday instead.

## 2023-05-26 ENCOUNTER — Encounter: Payer: Self-pay | Admitting: Family Medicine

## 2023-05-26 DIAGNOSIS — R2681 Unsteadiness on feet: Secondary | ICD-10-CM

## 2023-05-26 NOTE — Telephone Encounter (Signed)
Called spoke with [facility] verbal order was given for PT/ OT Nursing  Caller/Agency: [facility] ([facility])  Callback Number: [phone]  Requesting OT/PT/Skilled Nursing/Social Work/Speech Therapy: PT Pt was not feeling well so they are requesting start of care to start on Friday instead.

## 2023-05-27 ENCOUNTER — Telehealth: Payer: Self-pay | Admitting: Family Medicine

## 2023-05-27 NOTE — Telephone Encounter (Signed)
[facility] called and wanted to [facility] provider that patient has refused at home PT

## 2023-05-30 ENCOUNTER — Ambulatory Visit: Payer: Self-pay | Admitting: Licensed Clinical Social Worker

## 2023-05-30 ENCOUNTER — Other Ambulatory Visit: Payer: Self-pay

## 2023-05-30 DIAGNOSIS — F418 Other specified anxiety disorders: Secondary | ICD-10-CM

## 2023-05-30 MED ORDER — ISOSORBIDE MONONITRATE ER 120 MG PO TB24: 120.0000 mg | ORAL_TABLET | Freq: Every day | ORAL | 0 refills | Status: DC

## 2023-05-30 NOTE — Patient Instructions (Signed)
Social Work Visit Information  Thank you for taking time to visit with me today. Please don't hesitate to contact me if I can be of assistance to you.   Following are the goals we discussed today:   Goals Addressed             This Visit's Progress    Connect for therapy       Activities and task to complete in order to accomplish goals.   Per your request I am sending an advance directive packet,  Have advance directive notarized and provide a copy to provider office  I have placed a referral with [facility] they will contact you.  [phone] To schedule your therapy appointment         Our next appointment is by telephone on 06/14/23 at 3:30   Please call the care guide team at [phone] if you need to cancel or reschedule your appointment.   If you or anyone you know are experiencing a Mental Health or Behavioral Health Crisis or need someone to talk to, please call the Suicide and Crisis Lifeline: 988 call the [place] National Suicide Prevention Lifeline: (714)579-3784 or TTY: (646) 058-6829 TTY (412)540-9122) to talk to a trained counselor call 1-800-273-TALK (toll free, 24 hour hotline)   Patient verbalizes understanding of instructions and care plan provided today and agrees to view in MyChart. Active MyChart status and patient understanding of how to access instructions and care plan via MyChart confirmed with patient.       Sammuel Hines, LCSW Social Work Care Coordination  [facility] [employer] [employer] [phone]

## 2023-05-30 NOTE — Patient Outreach (Signed)
  Care Coordination  Follow Up Visit Note   05/30/2023 Name: [name] [name] MRN: [id number] DOB: 1946-07-06  [name] [name] is a 77 y.o. year old male who sees Bradd Canary, MD for primary care. I spoke with  Arcelia Jew by phone today.  What matters to the patients health and wellness today?  Managing symptoms of grief     Goals Addressed             This Visit's Progress    Connect for therapy       Activities and task to complete in order to accomplish goals.   Per your request I am sending an advance directive packet,  Have advance directive notarized and provide a copy to provider office  I have placed a referral with [facility] they will contact you.  [phone] To schedule your therapy appointment        SDOH assessments and interventions completed:  Yes  SDOH Interventions Today    Flowsheet Row Most Recent Value  SDOH Interventions   Alcohol Usage Interventions Intervention Not Indicated (Score <7)  Stress Interventions Intervention Not Indicated, Provide Counseling       Care Coordination Interventions:  Yes, provided  Interventions Today    Flowsheet Row Most Recent Value  Chronic Disease   Chronic disease during today's visit Hypertension (HTN), Congestive Heart Failure (CHF), Diabetes, Chronic Kidney Disease/End Stage Renal Disease (ESRD)  General Interventions   General Interventions Discussed/Reviewed General Interventions Reviewed  [reviewed care coordination program]  Education Interventions   Education Provided Provided Education  Provided Verbal Education On Mental Health/Coping with Illness  Mental Health Interventions   Mental Health Discussed/Reviewed Mental Health Reviewed, Anxiety, Depression, Grief and Loss  Nutrition Interventions   Nutrition Discussed/Reviewed Nutrition Reviewed  Safety Interventions   Safety Discussed/Reviewed Home Safety  [per patient he is scheduled to start PT this week]  Advanced  Directive Interventions   Advanced Directives Discussed/Reviewed Advanced Directives Discussed, Provided resource for acquiring and filling out documents  [will mail documents]       Follow up plan: Follow up call scheduled for 06/14/23    Encounter Outcome:  Pt. Visit Completed   Sammuel Hines, LCSW Social Work Care Coordination  [facility] [employer] [employer] [phone]

## 2023-05-31 ENCOUNTER — Other Ambulatory Visit: Payer: Self-pay | Admitting: Family Medicine

## 2023-05-31 ENCOUNTER — Other Ambulatory Visit (HOSPITAL_BASED_OUTPATIENT_CLINIC_OR_DEPARTMENT_OTHER): Payer: Self-pay

## 2023-05-31 ENCOUNTER — Ambulatory Visit: Payer: Self-pay

## 2023-05-31 ENCOUNTER — Other Ambulatory Visit: Payer: Self-pay

## 2023-05-31 DIAGNOSIS — G2581 Restless legs syndrome: Secondary | ICD-10-CM

## 2023-05-31 MED ORDER — SERTRALINE HCL 100 MG PO TABS
100.0000 mg | ORAL_TABLET | Freq: Two times a day (BID) | ORAL | 0 refills | Status: DC
  Filled 2023-05-31: qty 180, 90d supply, fill #0

## 2023-05-31 MED ORDER — PRAMIPEXOLE DIHYDROCHLORIDE 1 MG PO TABS
0.5000 mg | ORAL_TABLET | Freq: Every day | ORAL | 0 refills | Status: DC
Start: 2023-05-31 — End: 2023-08-05
  Filled 2023-05-31: qty 90, 90d supply, fill #0

## 2023-05-31 MED ORDER — PANTOPRAZOLE SODIUM 40 MG PO TBEC
40.0000 mg | DELAYED_RELEASE_TABLET | Freq: Every day | ORAL | 1 refills | Status: DC
  Filled 2023-05-31: qty 90, 90d supply, fill #0
  Filled 2023-08-29: qty 90, 90d supply, fill #1

## 2023-05-31 NOTE — Patient Outreach (Signed)
  Care Coordination   Follow Up Visit Note   05/31/2023 Name: [name] [name] MRN: [id number] DOB: [birth year]  [name] [name] is a 77 y.o. year old male who sees Bradd Canary, MD for primary care. I spoke with daughter [name] [name](daughter) by phone today.  What matters to the patients health and wellness today?  [name] Land reports she wanted to discuss her concerns with RNCM prior to [facility] reaching out as patient has dementia and may not be able to answer all questions such as regarding his medications. She expresses that she and her sisters recently noted a mental decline in patient's ability to self care for certain things such as cleaning, medication management. Per [name] Land, she and her sisters are working together to assist in patient's care. [name] Land is assisting patient with medication management. She reports patient is taking medications better now-they are now using a pill box filled weekly with an alarm to remind patient of when to take his medications. However, there are some medications that she is unclear if patient is supposed to be taking and/or does not have. [name] Land also expressed she has noticed some weight loss in patient. She states that they are making sure he has meals prepared now. RNCM attempted to contact patient today, however he was not home. RNCM will complete pharmacy referral regarding medication review/reconciliation.  Goals Addressed             This Visit's Progress    Assistance with health management       Interventions Today    Flowsheet Row Most Recent Value  Chronic Disease   Chronic disease during today's visit Diabetes, Congestive Heart Failure (CHF), Hypertension (HTN), Other, Atrial Fibrillation (AFib)  [Dementia]  General Interventions   General Interventions Discussed/Reviewed General Interventions Discussed, Communication with  [reiterated importance of updating designaated party release form.]  Communication with Pharmacists  Education  Interventions   Education Provided Provided Education  [advised can weigh weekly as concerned about weight loss and also can take and record blood pressure routinely to keep track]  Pharmacy Interventions   Pharmacy Dicussed/Reviewed Pharmacy Topics Discussed, Medication Adherence, Referral to Pharmacist  [per daughter [name] Land, who manages medciations. [name] Land reviewed the medications she knows that patient is taking. she expressed some medications she is not sure if patient is supposed to be taking and does not have.]  Medication Adherence Not taking medication            SDOH assessments and interventions completed:  No recently completed. No changes.   Care Coordination Interventions:  Yes, provided   Follow up plan:  within 1-2 weeks    Encounter Outcome:  Pt. Visit Completed   Kathyrn Sheriff, RN, MSN, BSN, CCM [facility] Care Coordinator [phone]

## 2023-05-31 NOTE — Patient Instructions (Signed)
Visit Information  Thank you for taking time to visit with me today. Please don't hesitate to contact me if I can be of assistance to you.   Following are the goals we discussed today:  I have place a referral to clinical pharmacy to review medications/assist you with medication questions, management Attend provider visits as scheduled Contact provider with health questions/concerns as needed   Our next appointment is by telephone on 06/07/23 at 11:00 am  Please call the care guide team at [phone] if you need to cancel or reschedule your appointment.   If you are experiencing a Mental Health or Behavioral Health Crisis or need someone to talk to, please call the Suicide and Crisis Lifeline: 59  Kathyrn Sheriff, RN, MSN, BSN, CCM [facility] Care Coordinator [phone]

## 2023-06-01 ENCOUNTER — Other Ambulatory Visit (HOSPITAL_BASED_OUTPATIENT_CLINIC_OR_DEPARTMENT_OTHER): Payer: Self-pay

## 2023-06-01 ENCOUNTER — Ambulatory Visit (INDEPENDENT_AMBULATORY_CARE_PROVIDER_SITE_OTHER): Payer: Medicare Other | Admitting: Pharmacist

## 2023-06-01 DIAGNOSIS — I25111 Atherosclerotic heart disease of native coronary artery with angina pectoris with documented spasm: Secondary | ICD-10-CM

## 2023-06-01 DIAGNOSIS — Z794 Long term (current) use of insulin: Secondary | ICD-10-CM

## 2023-06-01 DIAGNOSIS — E1142 Type 2 diabetes mellitus with diabetic polyneuropathy: Secondary | ICD-10-CM

## 2023-06-01 DIAGNOSIS — R42 Dizziness and giddiness: Secondary | ICD-10-CM

## 2023-06-01 DIAGNOSIS — F0390 Unspecified dementia without behavioral disturbance: Secondary | ICD-10-CM

## 2023-06-01 DIAGNOSIS — E785 Hyperlipidemia, unspecified: Secondary | ICD-10-CM

## 2023-06-01 DIAGNOSIS — I480 Paroxysmal atrial fibrillation: Secondary | ICD-10-CM

## 2023-06-02 ENCOUNTER — Other Ambulatory Visit (HOSPITAL_BASED_OUTPATIENT_CLINIC_OR_DEPARTMENT_OTHER): Payer: Self-pay

## 2023-06-02 ENCOUNTER — Other Ambulatory Visit: Payer: Self-pay | Admitting: Family Medicine

## 2023-06-02 ENCOUNTER — Telehealth: Payer: Self-pay | Admitting: Pharmacist

## 2023-06-02 MED ORDER — DONEPEZIL HCL 10 MG PO TABS
10.0000 mg | ORAL_TABLET | Freq: Every day | ORAL | 0 refills | Status: DC
  Filled 2023-06-02 – 2023-06-07 (×4): qty 90, 90d supply, fill #0

## 2023-06-02 MED ORDER — DONEPEZIL HCL 5 MG PO TABS
5.0000 mg | ORAL_TABLET | Freq: Every day | ORAL | 0 refills | Status: DC
  Filled 2023-06-02: qty 30, 30d supply, fill #0

## 2023-06-02 MED ORDER — RANOLAZINE ER 500 MG PO TB12
ORAL_TABLET | Freq: Two times a day (BID) | ORAL | 0 refills | Status: DC
  Filled 2023-06-02: qty 180, 90d supply, fill #0

## 2023-06-02 MED ORDER — LEVOTHYROXINE SODIUM 100 MCG PO TABS
100.0000 ug | ORAL_TABLET | Freq: Every day | ORAL | 0 refills | Status: DC
  Filled 2023-06-02 – 2023-07-28 (×2): qty 90, 90d supply, fill #0

## 2023-06-02 NOTE — Addendum Note (Signed)
Addended by: Henrene Pastor B on: 06/02/2023 02:26 PM   Modules accepted: Orders

## 2023-06-02 NOTE — Progress Notes (Signed)
06/02/2023 Name: [name] [name] MRN: [id number] DOB: 10-Apr-1946  Chief Complaint  Patient presents with   Medication Management    [name] [name] is a 77 y.o. year old male who presented for a telephone visit.   They were referred to the pharmacist by their Case Management Team  for assistance in managing complex medication management.   Received request form [name] Sheriff, RN that patient's daughter [name] [name] would like medication review/reconciliation. Patient's daughters are assisting with medication administration due to his dementia.   Subjective:  Care Team: Primary Care Provider: Bradd Canary, MD ; Next Scheduled Visit: not currently scheduled but there is an appointment with [name] Guest, NP at [facility] for 06/03/2023 Cardiologist: [name] [name]; Next Scheduled Visit: 06/22/2023 Endocrinologist [name] [name] [name]; Next Scheduled Visit: no appt currently scheduled - LV was 05/13/2023 Neurologist: [name] [name]; Next Scheduled Visit: 09/21/2023  Medication Access/Adherence  Current Pharmacy:  [facility] [place] - [facility] [address] [place] [address] Phone: [phone] Fax: [phone]  [id number] - [place], [place] - [address] AT [facility] [address] [place] [address] Phone: [phone] Fax: [phone]  [phone] [place], [place] - [address] [place] [address] Phone: [phone] Fax: [phone]   Patient reports affordability concerns with their medications: No  Patient reports access/transportation concerns to their pharmacy:  daughters are picking up prescriptions with [facility] Patient reports adherence concerns with their medications:  Yes  daughters are not sure which medications he should be taking and which he should not.    Diabetes:  Current medications:  Toujeo  60 units daily and Humalog 12 units 3 times a day with meals - use additional correction factor of bg-130 / 25 However per patient he is taking 64 units of Toujeo and he gave 21 units of Humalog 3 times today.   Previous medications: Trulicity and Ozempic - recommended but not started due to cost. Current blood glucose is 400.    Farxiga 10mg  daily (does increased 05/13/2023)   Using Libre 3 Continuous Glucose Monitor.     At last Endo Visit  Current  CGM use % of time 44 90%  Average and SD 266/44.2 326 / 28.9  Time in range  29    % 11%  % Time Above 181-250 17 8%  % Time above 250 52 81%  % Time Below target 2 0%   Trend in Continuous Glucose Monitor shows most days patient's blood glucose is lowest between midnight and 8am. Then blood glucose increases quickly and stay high 300 to 400 for rest of day.  Patient administers his own insulin. This is not monitored by his daughters. He reports rarely missing doses of insulin  Current glucose readings:   @CGM @  Patient reports hypoglycemic s/sx including dizziness but denies shakiness, sweating. I suspect that dizziness could be from hyperglycemia or other causes like medications.  Patient denies hyperglycemic symptoms including no polyuria, polydipsia, polyphagia, nocturia, neuropathy, blurred vision.  Current meal patterns:  Daughter states that they purchase healthy foods for patient but she cannot be sure that he does not possible sneak and get candy or other foods that could increase his blood glucose.    Hyperlipidemia/ASCVD Risk Reduction / CAD / Afib  Current lipid lowering medications: atorvastatin 80mg  daily  Fenofibrate in on his med list but has not  been filled in several months. Last Tg were at goal when checked 11/2022  Antiplatelet regimen: aspirin 81mg  daily  Also taking Eliquis 5mg  twice a day for stroke prevention related to afib.   Other cardiovascular medications: amiodarone 200mg  daily for rate  control isosorbide ER 120mg  daily for chest pain / CAD Ranolazine 500mg  twice a day for chest pain / CAD  - patient has not filled in about 1 year. He was seen in ED this year for chest pain. Needs updated Rx.   Patient has reported dizziness. He is taking several medications that could cause dizziness:  Amitriptyline, hydroxyzine, quetiapine, sertraline, cardio medications.  [name] [name] had recommended in April 2024 lowering dose of donepezil to 5mg  for 2 weeks to see if dizziness improved but per daughter's report he does not have donepezil on hand and refill history shows patient has not filled donepepzil since 06/2022 so I don't highly suspect donepezil as cause of dizziness.   Medication Management:  Current adherence strategy: daughters have just started to help with medications. He has clock with loud alarm to remind him to take medications.   Patient reports Poor adherence to medications  Patient reports the following barriers to adherence:  Multiple comorbidities Complex medication regimen Lack of care coordination between multiple providers Independent pausing, stopping or controlling of the medication.. Memory changes  Objective:  Lab Results  Component Value Date   HGBA1C 10.6 (A) 05/13/2023    Lab Results  Component Value Date   CREATININE 1.59 (H) 05/18/2023   BUN 27 (H) 05/18/2023   NA 135 05/18/2023   K 4.4 05/18/2023   CL 101 05/18/2023   CO2 26 05/18/2023    Lab Results  Component Value Date   CHOL 95 11/12/2022   HDL 41.00 11/12/2022   LDLCALC 37 11/12/2022   LDLDIRECT 77.0 08/20/2019   TRIG 81.0 11/12/2022   CHOLHDL 2 11/12/2022    Medications Reviewed Today     Reviewed by [name] [name], RPH-CPP (Pharmacist) on 06/01/23 at 1729  Med List Status: <None>   Medication Order Taking? Sig Documenting Provider Last Dose Status Informant  amiodarone (PACERONE) 200 MG tablet [id number] Yes Take 1 tablet (200 mg total) by mouth daily. [name] Crafts,  MD Taking Active   amitriptyline (ELAVIL) 10 MG tablet [id number] Yes Take 1-3 tablets (10-30 mg total) by mouth at bedtime. Clayborne Dana, NP Taking Active   apixaban (ELIQUIS) 5 MG TABS tablet [id number] Yes Take 1 tablet (5 mg total) by mouth 2 (two) times daily. [name] Crafts, MD Taking Active   aspirin EC 81 MG tablet [id number] Yes Take 81 mg by mouth daily. Swallow whole. [provider] Taking Active Self  atorvastatin (LIPITOR) 80 MG tablet [id number] Yes Take 1 tablet (80 mg total) by mouth daily. [name] Canary, MD Taking Active   Continuous Glucose Sensor (FREESTYLE LIBRE 3 SENSOR) [place] [id number] Yes Place 1 sensor on the skin every 14 days. Use to check glucose continuously Shamleffer, Konrad Dolores, MD Taking Active   dapagliflozin propanediol (FARXIGA) 10 MG TABS tablet [id number] Yes Take 1 tablet (10 mg total) by mouth daily. Shamleffer, Konrad Dolores, MD Taking Active   donepezil (ARICEPT) 10 MG tablet [id number] No Take 1 tablet (10 mg total) by mouth at bedtime.  Patient not taking: Reported on 05/31/2023   Windell Norfolk, MD Not Taking Active   glucose blood (ACCU-CHEK AVIVA) test strip [id number] Yes Use as directed to check blood sugar 4 times daily.  DX E11.9  [name] Canary, MD Taking Active Self  hydrocortisone 2.5 % cream [id number] Yes Apply 1 Application topically 2 (two) times daily. [provider] Taking Active   hydrOXYzine (ATARAX) 10 MG tablet [id number] Yes Take 1 tablet (10 mg total) by mouth 3 (three) times daily as needed for anxiety or itching. [name] Canary, MD Taking Active   insulin glargine, 2 Unit Dial, (TOUJEO MAX SOLOSTAR) 300 UNIT/ML Solostar Pen [id number] Yes Inject 60 Units into the skin daily in the afternoon. Shamleffer, Konrad Dolores, MD Taking Active   insulin lispro (HUMALOG KWIKPEN) 100 UNIT/ML KwikPen [id number] Yes Inject 10 Units into the skin 3 (three) times daily.  Patient taking differently: Inject 12 Units into the  skin 3 (three) times daily. Plus correction blood glucose- 130 / 25   Shamleffer, Konrad Dolores, MD Taking Active   Insulin Pen Needle 31G X 5 MM MISC [id number] Yes Use as directed to inject insulin in the morning, at noon, in the evening, and at bedtime. Shamleffer, Konrad Dolores, MD Taking Active   isosorbide mononitrate (IMDUR) 120 MG 24 hr tablet [id number] Yes Take 1 tablet (120 mg total) by mouth daily. [name] Canary, MD Taking Active            Med Note Earlene Plater, [place]   Tue May 31, 2023 11:22 AM) Reports picking up today  levocetirizine (XYZAL) 5 MG tablet [id number] Yes Take 1 tablet (5 mg total) by mouth every evening. Sharlene Dory, DO Taking Active   levothyroxine (SYNTHROID) 100 MCG tablet [id number] Yes Take 1 tablet (100 mcg total) by mouth daily before breakfast. [name] Canary, MD Taking Active   memantine [facility]) 10 MG tablet [id number] Yes Take 1 tablet (10 mg total) by mouth 2 (two) times daily. Windell Norfolk, MD Taking Active   nitroGLYCERIN (NITROSTAT) 0.4 MG SL tablet [id number]  PLACE 1 TABLET UNDER THE TONGUE EVERY 5 MINUTES AS NEEDED FOR CHEST PAIN. [name] Canary, MD  Active   pantoprazole (PROTONIX) 40 MG tablet [id number] Yes Take 1 tablet (40 mg total) by mouth daily. [name] Canary, MD Taking Active   pramipexole (MIRAPEX) 1 MG tablet [id number] Yes Take 1/2 to 1 tablet (0.5-1 mg total) by mouth at bedtime. [name] Canary, MD Taking Active   QUEtiapine (SEROQUEL) 25 MG tablet [id number] Yes Take 1 tablet (25 mg total) by mouth at bedtime. Windell Norfolk, MD Taking Active   ranolazine (RANEXA) 500 MG 12 hr tablet [id number] Yes TAKE 1 TABLET BY MOUTH TWICE DAILY [name] Canary, MD Taking Active Self  sertraline (ZOLOFT) 100 MG tablet [id number] Yes Take 1 tablet (100 mg total) by mouth 2 (two) times daily. [name] Canary, MD Taking Active               Assessment/Plan:   Diabetes: poorly controlled - possibly related to low adherence to meal  time insulin - Reviewed long term cardiovascular and renal outcomes of uncontrolled blood sugar - Reviewed goal A1c, goal fasting, and goal 2 hour post prandial glucose - Based on current correction factor, recommended patient take 23 units of Humalog insulin now. Will check back tomorrow to see if blood glucose has improved.  - I am hesitant to recommended increase in Toujeo as patient has had lows overnight in that past.  - Continue to use Libre 3 Continuous Glucose Monitor - connected with patient on [facility] [employer].  - Patient should increase water intake to avoid dehydration.  - If blood glucose continues to  be > 300 he should contact [name] office for recommendations.  - could reconsider GLP1 and try for medication assistance program - Ozempic   Hyperlipidemia/ASCVD Risk Reduction / CAD / Afib. - still has occasional chest pain - Will consult with PCP for refill for ranolazine 500mg  twice a day - Continue isosorbide 120mg  (just picked up), aspirin 81mg  daily and Elquis 5mg  twice a day.  - Continue atorvastatin. Removed fenofibrate form med list - patient has not taken in > 1 year   Medication Management: - Update medication list - removed that following medications - diclofenac tablets, diclofenac solution, divalproex (has not been prescribed since 2022), fenofibrate, triamcinolone.  - reviewed medications that could affect daytime drowsiness or cause dizziness:   Recommend only take hydroxyzine if needed and at night  Only take 10mg  of amitriptyline for sleep - if able try to taper off in future due to potential to cause daytime drowsiness and anti-cholinergic effects.   - due to memory deficits - recommended restarting donepezil at 5mg  at bedtime for 4 weeks, then 10mg  daily if tolerated.  - Last TSH was elevated - patient is due to recheck. Continue levothyroxine daily until TSH rechecked.    [name] [name], PharmD Clinical Pharmacist [facility]

## 2023-06-02 NOTE — Telephone Encounter (Signed)
Spoke with patient. He woke up around noon today and blood glucose was 250. He states this is lowest number he has seen in awhile.  He has not taken Toujeo yet but plans to when we get off the phone.  He plans to eat in another hour.  Reviewed with him that prior to eating he should check blood glucose and use 12 units of Humalog + correction factor of bg - 130/25 to given additional Humalog. Patient voices understanding.

## 2023-06-02 NOTE — Telephone Encounter (Signed)
Called patient to follow up on Continuous Glucose Monitor report from the last 24 hours and discuss meals and to see how much insulin he has given over the last 24 hours.  Unable to reach patient - LM on VM.  I did speak with his daughter [name] and she will try to contact patient and ask him to call me.

## 2023-06-02 NOTE — Telephone Encounter (Signed)
Pt was returning Tammy's call.  

## 2023-06-03 ENCOUNTER — Other Ambulatory Visit (HOSPITAL_BASED_OUTPATIENT_CLINIC_OR_DEPARTMENT_OTHER): Payer: Self-pay

## 2023-06-03 ENCOUNTER — Ambulatory Visit: Payer: Medicare Other | Admitting: Nurse Practitioner

## 2023-06-06 ENCOUNTER — Other Ambulatory Visit (INDEPENDENT_AMBULATORY_CARE_PROVIDER_SITE_OTHER): Payer: Medicare Other

## 2023-06-06 ENCOUNTER — Other Ambulatory Visit (HOSPITAL_BASED_OUTPATIENT_CLINIC_OR_DEPARTMENT_OTHER): Payer: Self-pay

## 2023-06-06 DIAGNOSIS — R944 Abnormal results of kidney function studies: Secondary | ICD-10-CM

## 2023-06-07 ENCOUNTER — Telehealth: Payer: Self-pay

## 2023-06-07 ENCOUNTER — Other Ambulatory Visit: Payer: Self-pay

## 2023-06-07 ENCOUNTER — Other Ambulatory Visit (HOSPITAL_BASED_OUTPATIENT_CLINIC_OR_DEPARTMENT_OTHER): Payer: Self-pay

## 2023-06-07 ENCOUNTER — Ambulatory Visit: Payer: Self-pay

## 2023-06-07 LAB — BASIC METABOLIC PANEL
BUN: 12 mg/dL (ref 6–23)
CO2: 26 mEq/L (ref 19–32)
Calcium: 9.1 mg/dL (ref 8.4–10.5)
Chloride: 101 mEq/L (ref 96–112)
Creatinine, Ser: 1.37 mg/dL (ref 0.40–1.50)
GFR: 50.73 mL/min — ABNORMAL LOW (ref >60.00)
Glucose, Bld: 504 mg/dL (ref 70–99)
Potassium: 4.4 mEq/L (ref 3.5–5.1)
Sodium: 135 mEq/L (ref 135–145)

## 2023-06-07 NOTE — Telephone Encounter (Signed)
CRITICAL VALUE STICKER  CRITICAL VALUE: Glucose-[id number]  RECEIVER (on-site recipient of call): Hendricks Limes (representative from lab): [id number]

## 2023-06-07 NOTE — Telephone Encounter (Signed)
Spoke to daughter, Burnell Blanks and instructions given. See result note.

## 2023-06-07 NOTE — Patient Instructions (Signed)
Visit Information  Thank you for taking time to visit with me today. Please don't hesitate to contact me if I can be of assistance to you.   Following are the goals we discussed today:  Continue to take medications as prescribed Continue to attend provider visits as scheduled Contact your provider with health questions or concerns as needed Continue to check blood sugars as recommended by your provider and notify provider if outside recommended range.   Our next appointment is by telephone on 07/11/23 at 10:30 am  Please call the care guide team at [phone] if you need to cancel or reschedule your appointment.   If you are experiencing a Mental Health or Behavioral Health Crisis or need someone to talk to, please call the Suicide and Crisis Lifeline: 68  Kathyrn Sheriff, RN, MSN, BSN, CCM [facility] Care Coordinator [phone]

## 2023-06-07 NOTE — Patient Outreach (Signed)
  Care Coordination   Follow Up Visit Note   06/07/2023 Name: [name] [name] MRN: [id number] DOB: 19-Nov-1946  [name] [name] is a 77 y.o. year old male who sees Bradd Canary, MD for primary care. I spoke with  Arcelia Jew by phone today.  What matters to the patients health and wellness today?  Mr. Kyne reports he is doing well. He reports he was told that he has early Alzheimer's and is in the process of completing advanced directives. He report he is not going to let it bother him and reports he has supportive daughters. He reports he continues to change position slowly because it will make him dizzy if he changes positions too quickly. He reports he is active with home health therapy. He continues to check blood sugar as recommended and reports blood sugar this morning was 152. He states he has four daughters assisting with his health and reports RNCM can talk to any of his daughter(Angela Olean Ree Mccorry, Dianna Strater and [employer]).  Goals Addressed             This Visit's Progress    Assistance with health management       Interventions Today    Flowsheet Row Most Recent Value  Chronic Disease   Chronic disease during today's visit Diabetes  General Interventions   General Interventions Discussed/Reviewed General Interventions Reviewed, Doctor Visits  Doctor Visits Discussed/Reviewed Specialist  [reviewed instructions per endocrinologist]  Durable Medical Equipment (DME) Dan Humphreys  Education Interventions   Education Provided Provided Education  Provided Verbal Education On Exercise, [employer], Blood Sugar Monitoring  [discussed importance of completing recommended exercise by therapist. discussed OTC benefit from insurance company. reviewed rule of 15 for low blood sugar]  Pharmacy Interventions   Pharmacy Dicussed/Reviewed Pharmacy Topics Reviewed  Safety Interventions   Safety Discussed/Reviewed Safety Reviewed, Fall Risk  [remains active  with home health therapy]  Advanced Directive Interventions   Advanced Directives Discussed/Reviewed Advanced Directives Discussed  [reports he is in the process of completing]            SDOH assessments and interventions completed:  No  Care Coordination Interventions:  Yes, provided   Follow up plan: Follow up call scheduled for 07/11/23    Encounter Outcome:  Pt. Visit Completed   Kathyrn Sheriff, RN, MSN, BSN, CCM [facility] Care Coordinator [phone]

## 2023-06-14 ENCOUNTER — Ambulatory Visit: Payer: Self-pay | Admitting: Licensed Clinical Social Worker

## 2023-06-14 NOTE — Patient Outreach (Signed)
  Care Coordination  Follow Up Visit Note   06/14/2023 Name: [name] [name] MRN: [id number] DOB: 05/11/1946  Nicole Kindred Scheffel is a 77 y.o. year old male who sees Bradd Canary, MD for primary care. I spoke with  Arcelia Jew 's daughter by phone today.  What matters to the patients health and wellness today?    Unable to reach patient during schedule appointment. Patient and daugher were together at an appointment.  LCSW will f/u with patient in 2 to 7 days.  Goals Addressed             This Visit's Progress    COMPLETED: Caregiver support       Activities and task to complete in order to accomplish goals.   No additional support needed at this time        SDOH assessments and interventions completed:  No   Care Coordination Interventions:  Yes, provided  Interventions Today    Flowsheet Row Most Recent Value  Chronic Disease   Chronic disease during today's visit Hypertension (HTN), Congestive Heart Failure (CHF), Diabetes  General Interventions   General Interventions Discussed/Reviewed General Interventions Reviewed  Education Interventions   Provided Verbal Education On Mental Health/Coping with Illness  [caregiver stress]  Advanced Directive Interventions   Advanced Directives Discussed/Reviewed Advanced Directives Discussed       Follow up plan:  No f/u scheduled at this time.  Will call patient in 2 to 7 days to reschedule.    Encounter Outcome:  Pt. Visit Completed   Sammuel Hines, LCSW Social Work Care Coordination  [facility] [employer] [phone]

## 2023-06-14 NOTE — Patient Instructions (Signed)
  I am sorry you were unable to keep your phone appointment today.   I will call you later on this week  Sammuel Hines, LCSW Social Work Care Coordination  [phone]

## 2023-06-15 ENCOUNTER — Telehealth: Payer: Self-pay | Admitting: Licensed Clinical Social Worker

## 2023-06-15 NOTE — Patient Outreach (Signed)
  Care Coordination   06/15/2023 Name: RICE WALSH MRN: [id number] DOB: Aug 28, 1946   Care Coordination Outreach Attempts:  An unsuccessful telephone outreach was attempted today to offer the patient information about available care coordination services.  Follow Up Plan:  Additional outreach attempts will be made to offer the patient care coordination information and services.   Encounter Outcome:  No Answer   Care Coordination Interventions:  No, not indicated    Sammuel Hines, LCSW Social Work Care Coordination  [facility] [employer] [phone]

## 2023-06-16 ENCOUNTER — Other Ambulatory Visit: Payer: Self-pay

## 2023-06-16 ENCOUNTER — Other Ambulatory Visit (HOSPITAL_BASED_OUTPATIENT_CLINIC_OR_DEPARTMENT_OTHER): Payer: Self-pay

## 2023-06-16 MED ORDER — ISOSORBIDE MONONITRATE ER 120 MG PO TB24
120.0000 mg | ORAL_TABLET | Freq: Every day | ORAL | 0 refills | Status: DC
  Filled 2023-06-16 – 2023-06-24 (×7): qty 30, 30d supply, fill #0

## 2023-06-17 ENCOUNTER — Other Ambulatory Visit (HOSPITAL_BASED_OUTPATIENT_CLINIC_OR_DEPARTMENT_OTHER): Payer: Self-pay

## 2023-06-20 ENCOUNTER — Encounter: Payer: Self-pay | Admitting: Neurology

## 2023-06-20 ENCOUNTER — Other Ambulatory Visit (HOSPITAL_BASED_OUTPATIENT_CLINIC_OR_DEPARTMENT_OTHER): Payer: Self-pay

## 2023-06-20 ENCOUNTER — Ambulatory Visit: Payer: Medicare Other | Admitting: Neurology

## 2023-06-20 ENCOUNTER — Telehealth: Payer: Self-pay

## 2023-06-20 VITALS — BP 135/60 | HR 63 | Ht 68.0 in | Wt 166.5 lb

## 2023-06-20 DIAGNOSIS — E1142 Type 2 diabetes mellitus with diabetic polyneuropathy: Secondary | ICD-10-CM

## 2023-06-20 DIAGNOSIS — R2689 Other abnormalities of gait and mobility: Secondary | ICD-10-CM

## 2023-06-20 DIAGNOSIS — G301 Alzheimer's disease with late onset: Secondary | ICD-10-CM

## 2023-06-20 DIAGNOSIS — F02A0 Dementia in other diseases classified elsewhere, mild, without behavioral disturbance, psychotic disturbance, mood disturbance, and anxiety: Secondary | ICD-10-CM

## 2023-06-20 DIAGNOSIS — R2681 Unsteadiness on feet: Secondary | ICD-10-CM

## 2023-06-20 NOTE — Telephone Encounter (Signed)
 Sent pt message

## 2023-06-20 NOTE — Patient Instructions (Signed)
Discontinue Aricept Continue your other medications Continue with aggressive management of diabetes Continue with physical therapy as scheduled Follow-up in 6 months as scheduled or sooner if worse.
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
## 2023-06-21 ENCOUNTER — Other Ambulatory Visit (HOSPITAL_BASED_OUTPATIENT_CLINIC_OR_DEPARTMENT_OTHER): Payer: Self-pay

## 2023-06-21 DIAGNOSIS — R269 Unspecified abnormalities of gait and mobility: Secondary | ICD-10-CM | POA: Diagnosis not present

## 2023-06-21 DIAGNOSIS — R296 Repeated falls: Secondary | ICD-10-CM | POA: Diagnosis not present
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
## 2023-06-22 ENCOUNTER — Other Ambulatory Visit (HOSPITAL_BASED_OUTPATIENT_CLINIC_OR_DEPARTMENT_OTHER): Payer: Self-pay

## 2023-06-22 ENCOUNTER — Ambulatory Visit: Payer: Medicare Other | Attending: Interventional Cardiology | Admitting: Physician Assistant

## 2023-06-22 ENCOUNTER — Encounter: Payer: Self-pay | Admitting: Physician Assistant

## 2023-06-22 ENCOUNTER — Telehealth: Payer: Self-pay | Admitting: Family Medicine

## 2023-06-22 ENCOUNTER — Ambulatory Visit: Payer: Medicare Other | Admitting: Physician Assistant

## 2023-06-22 VITALS — BP 120/52 | HR 72 | Ht 68.0 in | Wt 164.0 lb

## 2023-06-22 DIAGNOSIS — I2510 Atherosclerotic heart disease of native coronary artery without angina pectoris: Secondary | ICD-10-CM | POA: Diagnosis not present

## 2023-06-22 DIAGNOSIS — E119 Type 2 diabetes mellitus without complications: Secondary | ICD-10-CM | POA: Diagnosis not present

## 2023-06-22 DIAGNOSIS — I480 Paroxysmal atrial fibrillation: Secondary | ICD-10-CM | POA: Diagnosis not present

## 2023-06-22 DIAGNOSIS — E785 Hyperlipidemia, unspecified: Secondary | ICD-10-CM

## 2023-06-22 DIAGNOSIS — Z7982 Long term (current) use of aspirin: Secondary | ICD-10-CM | POA: Diagnosis not present

## 2023-06-22 DIAGNOSIS — Z7901 Long term (current) use of anticoagulants: Secondary | ICD-10-CM | POA: Diagnosis not present

## 2023-06-22 DIAGNOSIS — N189 Chronic kidney disease, unspecified: Secondary | ICD-10-CM

## 2023-06-22 DIAGNOSIS — I10 Essential (primary) hypertension: Secondary | ICD-10-CM

## 2023-06-22 DIAGNOSIS — Z794 Long term (current) use of insulin: Secondary | ICD-10-CM | POA: Diagnosis not present

## 2023-06-22 NOTE — Telephone Encounter (Signed)
Called [employer] and verbal was given for OT PT.

## 2023-06-22 NOTE — Patient Instructions (Signed)
Medication Instructions:   Your physician recommends that you continue on your current medications as directed. Please refer to the Current Medication list given to you today.   *If you need a refill on your cardiac medications before your next appointment, please call your pharmacy*    Labs: MAKE SURE IN DEC YOU GET LIVER AND CHOLESTEROL CHECKED    If you have labs (blood work) drawn today and your tests are completely normal, you will receive your results only by: MyChart Message (if you have MyChart) OR A paper copy in the mail If you have any lab test that is abnormal or we need to change your treatment, we will call you to review the results.   Testing/Procedures: NONE ORDERED  TODAY      Follow-Up: At [facility], you and your health needs are our priority.  As part of our continuing mission to provide you with exceptional heart care, we have created designated Provider Care Teams.  These Care Teams include your primary Cardiologist (physician) and Advanced Practice Providers (APPs -  Physician Assistants and Nurse Practitioners) who all work together to provide you with the care you need, when you need it.  We recommend signing up for the patient portal called "MyChart".  Sign up information is provided on this After Visit Summary.  MyChart is used to connect with patients for Virtual Visits (Telemedicine).  Patients are able to view lab/test results, encounter notes, upcoming appointments, etc.  Non-urgent messages can be sent to your provider as well.   To learn more about what you can do with MyChart, go to [email or web address].    Your next appointment:    1 year(s)  Provider:    Lance Muss, MD     Other Instructions

## 2023-06-22 NOTE — Telephone Encounter (Signed)
Caller/Agency: stephanie ([facility])  Callback Number: [phone]  Requesting OT/PT/Skilled Nursing/Social Work/Speech Therapy: nursing Frequency: 1x for 4 weeks

## 2023-06-23 ENCOUNTER — Telehealth: Payer: Self-pay | Admitting: Licensed Clinical Social Worker

## 2023-06-23 ENCOUNTER — Other Ambulatory Visit (HOSPITAL_BASED_OUTPATIENT_CLINIC_OR_DEPARTMENT_OTHER): Payer: Self-pay

## 2023-06-23 NOTE — Patient Outreach (Signed)
  Care Coordination   06/23/2023 Name: [name] [name] MRN: [id number] DOB: [birth year]   Care Coordination Outreach Attempts:  An unsuccessful telephone outreach was attempted today to offer the patient information about available care coordination services.  Follow Up Plan:  Additional outreach attempts will be made to offer the patient care coordination information and services.   Encounter Outcome:  No Answer   Care Coordination Interventions:  No, not indicated    Sammuel Hines, LCSW Social Work Care Coordination  [facility] Emmie Niemann [employer] [phone]

## 2023-06-24 ENCOUNTER — Other Ambulatory Visit (HOSPITAL_BASED_OUTPATIENT_CLINIC_OR_DEPARTMENT_OTHER): Payer: Self-pay

## 2023-06-24 DIAGNOSIS — Z794 Long term (current) use of insulin: Secondary | ICD-10-CM | POA: Diagnosis not present

## 2023-06-24 DIAGNOSIS — I10 Essential (primary) hypertension: Secondary | ICD-10-CM | POA: Diagnosis not present

## 2023-06-24 DIAGNOSIS — E119 Type 2 diabetes mellitus without complications: Secondary | ICD-10-CM | POA: Diagnosis not present

## 2023-06-24 DIAGNOSIS — Z7982 Long term (current) use of aspirin: Secondary | ICD-10-CM | POA: Diagnosis not present

## 2023-06-24 DIAGNOSIS — Z7901 Long term (current) use of anticoagulants: Secondary | ICD-10-CM | POA: Diagnosis not present

## 2023-06-27 ENCOUNTER — Telehealth: Payer: Self-pay | Admitting: Family Medicine

## 2023-06-27 ENCOUNTER — Other Ambulatory Visit (HOSPITAL_BASED_OUTPATIENT_CLINIC_OR_DEPARTMENT_OTHER): Payer: Self-pay

## 2023-06-27 ENCOUNTER — Other Ambulatory Visit: Payer: Self-pay

## 2023-06-27 NOTE — Telephone Encounter (Signed)
Caller/Agency: [facility] ([facility])  Callback Number: [phone] (secure) Requesting OT/PT/Skilled Nursing/Social Work/Speech Therapy: PT Frequency:  1x for 1 week 2x for 3 weeks 1x biweekly for 3 weeks.

## 2023-06-27 NOTE — Telephone Encounter (Signed)
Called verbal ordered was given Caller/Agency: [facility] ([facility])  Callback Number: [phone] (secure) Requesting OT/PT/Skilled Nursing/Social Work/Speech Therapy: PT Frequency:  1x for 1 week 2x for 3 weeks 1x biweekly for 3 weeks.

## 2023-06-28 ENCOUNTER — Other Ambulatory Visit: Payer: Self-pay

## 2023-06-29 ENCOUNTER — Other Ambulatory Visit: Payer: Self-pay

## 2023-06-29 ENCOUNTER — Other Ambulatory Visit: Payer: Self-pay | Admitting: Family Medicine

## 2023-06-29 ENCOUNTER — Other Ambulatory Visit (HOSPITAL_BASED_OUTPATIENT_CLINIC_OR_DEPARTMENT_OTHER): Payer: Self-pay

## 2023-06-29 DIAGNOSIS — G4700 Insomnia, unspecified: Secondary | ICD-10-CM

## 2023-06-29 DIAGNOSIS — Z794 Long term (current) use of insulin: Secondary | ICD-10-CM | POA: Diagnosis not present

## 2023-06-29 DIAGNOSIS — I10 Essential (primary) hypertension: Secondary | ICD-10-CM | POA: Diagnosis not present

## 2023-06-29 DIAGNOSIS — Z7901 Long term (current) use of anticoagulants: Secondary | ICD-10-CM | POA: Diagnosis not present

## 2023-06-29 DIAGNOSIS — F419 Anxiety disorder, unspecified: Secondary | ICD-10-CM

## 2023-06-29 DIAGNOSIS — E119 Type 2 diabetes mellitus without complications: Secondary | ICD-10-CM | POA: Diagnosis not present

## 2023-06-29 DIAGNOSIS — Z7982 Long term (current) use of aspirin: Secondary | ICD-10-CM | POA: Diagnosis not present

## 2023-06-29 MED ORDER — AMITRIPTYLINE HCL 10 MG PO TABS
10.0000 mg | ORAL_TABLET | Freq: Every day | ORAL | 1 refills | Status: DC
Start: 2023-06-29 — End: 2023-08-29
  Filled 2023-06-29 – 2023-07-01 (×3): qty 90, 30d supply, fill #0
  Filled 2023-08-01: qty 90, 30d supply, fill #1

## 2023-06-30 ENCOUNTER — Other Ambulatory Visit (HOSPITAL_BASED_OUTPATIENT_CLINIC_OR_DEPARTMENT_OTHER): Payer: Self-pay

## 2023-06-30 DIAGNOSIS — Z794 Long term (current) use of insulin: Secondary | ICD-10-CM | POA: Diagnosis not present

## 2023-06-30 DIAGNOSIS — I10 Essential (primary) hypertension: Secondary | ICD-10-CM | POA: Diagnosis not present

## 2023-06-30 DIAGNOSIS — E119 Type 2 diabetes mellitus without complications: Secondary | ICD-10-CM | POA: Diagnosis not present

## 2023-06-30 DIAGNOSIS — Z7982 Long term (current) use of aspirin: Secondary | ICD-10-CM | POA: Diagnosis not present

## 2023-06-30 DIAGNOSIS — Z7901 Long term (current) use of anticoagulants: Secondary | ICD-10-CM | POA: Diagnosis not present

## 2023-07-01 ENCOUNTER — Other Ambulatory Visit (HOSPITAL_BASED_OUTPATIENT_CLINIC_OR_DEPARTMENT_OTHER): Payer: Self-pay

## 2023-07-01 DIAGNOSIS — Z7901 Long term (current) use of anticoagulants: Secondary | ICD-10-CM | POA: Diagnosis not present

## 2023-07-01 DIAGNOSIS — E119 Type 2 diabetes mellitus without complications: Secondary | ICD-10-CM | POA: Diagnosis not present

## 2023-07-01 DIAGNOSIS — I10 Essential (primary) hypertension: Secondary | ICD-10-CM | POA: Diagnosis not present

## 2023-07-01 DIAGNOSIS — Z794 Long term (current) use of insulin: Secondary | ICD-10-CM | POA: Diagnosis not present

## 2023-07-01 DIAGNOSIS — Z7982 Long term (current) use of aspirin: Secondary | ICD-10-CM | POA: Diagnosis not present

## 2023-07-05 ENCOUNTER — Other Ambulatory Visit (HOSPITAL_BASED_OUTPATIENT_CLINIC_OR_DEPARTMENT_OTHER): Payer: Self-pay

## 2023-07-05 ENCOUNTER — Other Ambulatory Visit (HOSPITAL_COMMUNITY): Payer: Self-pay

## 2023-07-05 DIAGNOSIS — Z794 Long term (current) use of insulin: Secondary | ICD-10-CM | POA: Diagnosis not present

## 2023-07-05 DIAGNOSIS — Z7901 Long term (current) use of anticoagulants: Secondary | ICD-10-CM | POA: Diagnosis not present

## 2023-07-05 DIAGNOSIS — E119 Type 2 diabetes mellitus without complications: Secondary | ICD-10-CM | POA: Diagnosis not present

## 2023-07-05 DIAGNOSIS — Z7982 Long term (current) use of aspirin: Secondary | ICD-10-CM | POA: Diagnosis not present

## 2023-07-05 DIAGNOSIS — I10 Essential (primary) hypertension: Secondary | ICD-10-CM | POA: Diagnosis not present

## 2023-07-06 DIAGNOSIS — Z7901 Long term (current) use of anticoagulants: Secondary | ICD-10-CM | POA: Diagnosis not present

## 2023-07-06 DIAGNOSIS — Z794 Long term (current) use of insulin: Secondary | ICD-10-CM | POA: Diagnosis not present

## 2023-07-06 DIAGNOSIS — I10 Essential (primary) hypertension: Secondary | ICD-10-CM | POA: Diagnosis not present

## 2023-07-06 DIAGNOSIS — E119 Type 2 diabetes mellitus without complications: Secondary | ICD-10-CM | POA: Diagnosis not present

## 2023-07-06 DIAGNOSIS — Z7982 Long term (current) use of aspirin: Secondary | ICD-10-CM | POA: Diagnosis not present

## 2023-07-07 ENCOUNTER — Other Ambulatory Visit (HOSPITAL_BASED_OUTPATIENT_CLINIC_OR_DEPARTMENT_OTHER): Payer: Self-pay

## 2023-07-07 ENCOUNTER — Other Ambulatory Visit: Payer: Self-pay | Admitting: Family Medicine

## 2023-07-07 MED ORDER — HYDROXYZINE HCL 10 MG PO TABS
10.0000 mg | ORAL_TABLET | Freq: Three times a day (TID) | ORAL | 1 refills | Status: DC | PRN
  Filled 2023-07-07 (×2): qty 30, 10d supply, fill #0
  Filled 2023-08-05: qty 30, 10d supply, fill #1

## 2023-07-11 ENCOUNTER — Other Ambulatory Visit (HOSPITAL_BASED_OUTPATIENT_CLINIC_OR_DEPARTMENT_OTHER): Payer: Self-pay

## 2023-07-11 ENCOUNTER — Ambulatory Visit: Payer: Self-pay

## 2023-07-11 NOTE — Patient Outreach (Signed)
  Care Coordination   Follow Up Visit Note   07/11/2023 Name: [name] [name] MRN: [id number] DOB: 1946/10/27  [name] [name] is a 77 y.o. year old male who sees Bradd Canary, MD for primary care. I spoke with  Arcelia Jew by phone today.  What matters to the patients health and wellness today?  Mr. Katzer reports he is doing well. He confirms he remains active with home health therapy. And states he is following through with the recommended exercises between visits. He reports he tripped over bottom of his walker that was sticking out last week. He denies injury. He states therapist to come tomorrow and states he will discuss with therapist. Mr. Townsend reports supportive family and friends. He states his brother recently in hospital with similar symptoms. Mr. Sistare states he and his daughters are going to discuss with PCP to see if there if there is similar family condition. He reports his daughters are working with his provider to obtain a new CPAP machine.   Goals Addressed             This Visit's Progress    Assistance with health management       Interventions Today    Flowsheet Row Most Recent Value  Chronic Disease   Chronic disease during today's visit Diabetes, Other  General Interventions   General Interventions Discussed/Reviewed General Interventions Reviewed, Doctor Visits  Doctor Visits Discussed/Reviewed Doctor Visits Discussed, PCP, Specialist  PCP/Specialist Visits Compliance with follow-up visit  [reviewed upcoming/scheduled appointments]  Exercise Interventions   Exercise Discussed/Reviewed Exercise Reviewed  [confirmed remains active with home health therapy]  Education Interventions   Education Provided Provided Education  Provided Verbal Education On Other  [reviewed fall prevention strategies]  Pharmacy Interventions   Pharmacy Dicussed/Reviewed Pharmacy Topics Reviewed  Safety Interventions   Safety Discussed/Reviewed Safety Reviewed, Fall  Risk            SDOH assessments and interventions completed:  No  Care Coordination Interventions:  Yes, provided   Follow up plan: Follow up call scheduled for 08/11/23    Encounter Outcome:  Pt. Visit Completed   Kathyrn Sheriff, RN, MSN, BSN, CCM [facility] Care Coordinator [phone]

## 2023-07-11 NOTE — Patient Instructions (Signed)
Visit Information  Thank you for taking time to visit with me today. Please don't hesitate to contact me if I can be of assistance to you.   Following are the goals we discussed today:   Goals Addressed             This Visit's Progress    Assistance with health management       Interventions Today    Flowsheet Row Most Recent Value  Chronic Disease   Chronic disease during today's visit Diabetes, Other  General Interventions   General Interventions Discussed/Reviewed General Interventions Reviewed, Doctor Visits  Doctor Visits Discussed/Reviewed Doctor Visits Discussed, PCP, Specialist  PCP/Specialist Visits Compliance with follow-up visit  [reviewed upcoming/scheduled appointments]  Exercise Interventions   Exercise Discussed/Reviewed Exercise Reviewed  [confirmed remains active with home health therapy]  Education Interventions   Education Provided Provided Education  Provided Verbal Education On Other  [reviewed fall prevention strategies]  Pharmacy Interventions   Pharmacy Dicussed/Reviewed Pharmacy Topics Reviewed  Safety Interventions   Safety Discussed/Reviewed Safety Reviewed, Fall Risk            Our next appointment is by telephone on 08/11/23 at 11:30 am  Please call the care guide team at [phone] if you need to cancel or reschedule your appointment.   If you are experiencing a Mental Health or Behavioral Health Crisis or need someone to talk to, please call the Suicide and Crisis Lifeline: 4  Kathyrn Sheriff, RN, MSN, BSN, CCM [facility] Care Coordinator [phone]

## 2023-07-12 ENCOUNTER — Other Ambulatory Visit (HOSPITAL_BASED_OUTPATIENT_CLINIC_OR_DEPARTMENT_OTHER): Payer: Self-pay

## 2023-07-12 ENCOUNTER — Encounter: Payer: Self-pay | Admitting: Family

## 2023-07-12 ENCOUNTER — Ambulatory Visit (INDEPENDENT_AMBULATORY_CARE_PROVIDER_SITE_OTHER): Payer: Medicare Other | Admitting: Family

## 2023-07-12 VITALS — BP 124/62 | HR 74 | Ht 68.0 in | Wt 165.0 lb

## 2023-07-12 DIAGNOSIS — R899 Unspecified abnormal finding in specimens from other organs, systems and tissues: Secondary | ICD-10-CM

## 2023-07-12 DIAGNOSIS — R42 Dizziness and giddiness: Secondary | ICD-10-CM | POA: Diagnosis not present

## 2023-07-12 LAB — COMPREHENSIVE METABOLIC PANEL
ALT: 27 U/L (ref 0–53)
AST: 21 U/L (ref 0–37)
Albumin: 4.3 g/dL (ref 3.5–5.2)
Alkaline Phosphatase: 68 U/L (ref 39–117)
BUN: 22 mg/dL (ref 6–23)
CO2: 27 mEq/L (ref 19–32)
Calcium: 9.6 mg/dL (ref 8.4–10.5)
Chloride: 101 mEq/L (ref 96–112)
Creatinine, Ser: 1.35 mg/dL (ref 0.40–1.50)
GFR: 51.6 mL/min — ABNORMAL LOW (ref >60.00)
Glucose, Bld: 177 mg/dL — ABNORMAL HIGH (ref 70–99)
Potassium: 4 mEq/L (ref 3.5–5.1)
Sodium: 137 mEq/L (ref 135–145)
Total Bilirubin: 0.8 mg/dL (ref 0.2–1.2)
Total Protein: 6.7 g/dL (ref 6.0–8.3)

## 2023-07-12 LAB — CBC WITH DIFFERENTIAL/PLATELET
Basophils Absolute: 0.1 10*3/uL (ref 0.0–0.1)
Basophils Relative: 0.6 % (ref 0.0–3.0)
Eosinophils Absolute: 0.4 10*3/uL (ref 0.0–0.7)
Eosinophils Relative: 4.6 % (ref 0.0–5.0)
HCT: 42.6 % (ref 39.0–52.0)
Hemoglobin: 14.4 g/dL (ref 13.0–17.0)
Lymphocytes Relative: 21.8 % (ref 12.0–46.0)
Lymphs Abs: 2 10*3/uL (ref 0.7–4.0)
MCHC: 33.7 g/dL (ref 30.0–36.0)
MCV: 90.3 fl (ref 78.0–100.0)
Monocytes Absolute: 0.8 10*3/uL (ref 0.1–1.0)
Monocytes Relative: 8.5 % (ref 3.0–12.0)
Neutro Abs: 5.8 10*3/uL (ref 1.4–7.7)
Neutrophils Relative %: 64.5 % (ref 43.0–77.0)
Platelets: 176 10*3/uL (ref 150.0–400.0)
RBC: 4.72 Mil/uL (ref 4.22–5.81)
RDW: 14.7 % (ref 11.5–15.5)
WBC: 9 10*3/uL (ref 4.0–10.5)

## 2023-07-12 LAB — VITAMIN B12: Vitamin B-12: 328 pg/mL (ref 211–911)

## 2023-07-12 NOTE — Progress Notes (Signed)
[name] [name] is a 77 y.o. male with the following history as recorded in Epic[employer]:  Patient Active Problem List   Diagnosis Date Noted   Gait instability 05/18/2023   Dementia without behavioral disturbance, psychotic disturbance, mood disturbance, or anxiety (HCC) 05/18/2023   Right hand pain 08/06/2022   Polyneuropathy associated with underlying disease (HCC) 06/02/2022   Elevated sed rate 04/27/2022   Diabetes mellitus (HCC) 04/13/2022   Type 2 diabetes mellitus with stage 3a chronic kidney disease, with long-term current use of insulin (HCC) 04/13/2022   Type 2 diabetes mellitus with diabetic polyneuropathy, with long-term current use of insulin (HCC) 04/13/2022   Angina at rest 02/12/2022   Hypoglycemia 02/12/2022   Primary osteoarthritis of right knee 09/22/2021   Sprain of anterior talofibular ligament of left ankle 01/08/2021   Achilles tendon pain 06/06/2020   Thoracic back pain 05/11/2020   CKD (chronic kidney disease), stage IV (HCC)    Frequency of urination 05/13/2019   Obesity 02/05/2019   Neck pain 02/05/2019   MCI (mild cognitive impairment) 02/05/2019   Fall at home, initial encounter 11/06/2018   Localized swelling of both lower extremities 07/13/2018   Cervical radiculopathy at C8 02/08/2018   Hypersomnolence 10/18/2017   Vitamin D deficiency 05/17/2017   Seizure disorder (HCC) 01/09/2017   PAF (paroxysmal atrial fibrillation) (HCC) 07/20/2016   Long term (current) use of anticoagulants [Z79.01] 07/20/2016   Foraminal stenosis of cervical region 05/31/2016   DOE (dyspnea on exertion) 05/14/2016   Chronic diastolic CHF (congestive heart failure) (HCC) 03/13/2016   Headache 01/25/2016   Carotid artery disease (HCC) 08/01/2015   Spinal stenosis in cervical region 07/30/2015   Occipital neuralgia of right side 07/15/2015   Cervicogenic headache 07/15/2015   Dizziness    Chest pain 05/29/2015   Medicare annual wellness visit, initial 05/11/2015    Sun-damaged skin 05/11/2015   Advance care planning 05/01/2015   Post-traumatic headache 02/18/2015   OA (osteoarthritis) of knee 01/07/2015   Recurrent pain of right knee 05/10/2014   Personal history of ongoing treatment with alendronate (Fosamax) 12/20/2013   History of blood transfusion 12/20/2013   Hyperlipidemia LDL goal <70 06/27/2013   Essential hypertension 06/05/2013   Arthritis of hand, degenerative 06/05/2013   Acquired atrophy of thyroid 06/05/2013   Hypothyroidism due to acquired atrophy of thyroid 06/05/2013   Osteoarthritis of hand 06/05/2013   Arteriosclerosis of coronary artery 05/01/2013   BPH with obstruction/lower urinary tract symptoms 05/01/2013   CAD (coronary artery disease) 05/01/2013   Abdominal pain, unspecified site 03/06/2013   Right shoulder pain 10/24/2012   Dermatitis 10/24/2012   Ejection fraction    Diabetes mellitus type II, uncontrolled 08/31/2011   Coronary artery disease involving native coronary artery    Anemia    OSA (obstructive sleep apnea)    Edema    Falling episodes    Hx of CABG    Palpitations    Shortness of breath    Insomnia 04/26/2011   RLS (restless legs syndrome) 09/19/2009   Depression with anxiety 07/09/2009   RBBB 07/09/2009   Low back pain potentially associated with radiculopathy 06/12/2009   SCIATICA 01/17/2009   GERD (gastroesophageal reflux disease) 01/09/2009   TUBULOVILLOUS ADENOMA, COLON, HX OF 01/08/2009   Presence of drug coated stent in right coronary artery 10/29/2008   NEPHROLITHIASIS, HX OF 12/20/2007    Current Outpatient Medications  Medication Sig Dispense Refill   amiodarone (PACERONE) 200 MG tablet Take 1 tablet (200 mg total) by mouth daily.  90 tablet 3   amitriptyline (ELAVIL) 10 MG tablet Take 1-3 tablets (10-30 mg total) by mouth at bedtime. 90 tablet 1   apixaban (ELIQUIS) 5 MG TABS tablet Take 1 tablet (5 mg total) by mouth 2 (two) times daily. 60 tablet 5   aspirin EC 81 MG tablet Take 81  mg by mouth daily. Swallow whole.     atorvastatin (LIPITOR) 80 MG tablet Take 1 tablet (80 mg total) by mouth daily. 90 tablet 0   Continuous Glucose Sensor (FREESTYLE LIBRE 3 SENSOR) MISC Place 1 sensor on the skin every 14 days. Use to check glucose continuously 6 each 3   dapagliflozin propanediol (FARXIGA) 10 MG TABS tablet Take 1 tablet (10 mg total) by mouth daily. 90 tablet 3   donepezil (ARICEPT) 10 MG tablet Take 1 tablet (10 mg total) by mouth at bedtime. 90 tablet 0   glucose blood (ACCU-CHEK AVIVA) test strip Use as directed to check blood sugar 4 times daily.  DX E11.9 200 each 6   hydrocortisone 2.5 % cream Apply 1 Application topically 2 (two) times daily.     hydrOXYzine (ATARAX) 10 MG tablet Take 1 tablet (10 mg total) by mouth 3 (three) times daily as needed for anxiety or itching. 30 tablet 1   insulin glargine, 2 Unit Dial, (TOUJEO MAX SOLOSTAR) 300 UNIT/ML Solostar Pen Inject 60 Units into the skin daily in the afternoon. 30 mL 2   insulin lispro (HUMALOG KWIKPEN) 100 UNIT/ML KwikPen Inject 10 Units into the skin 3 (three) times daily. (Patient taking differently: Inject 12 Units into the skin 3 (three) times daily. Plus correction blood glucose- 130 / 25) 45 mL 3   Insulin Pen Needle 31G X 5 MM MISC Use as directed to inject insulin in the morning, at noon, in the evening, and at bedtime. 400 each 3   isosorbide mononitrate (IMDUR) 120 MG 24 hr tablet Take 1 tablet (120 mg total) by mouth daily. 30 tablet 0   levocetirizine (XYZAL) 5 MG tablet Take 1 tablet (5 mg total) by mouth every evening. 30 tablet 2   levothyroxine (SYNTHROID) 100 MCG tablet Take 1 tablet (100 mcg total) by mouth daily before breakfast. 90 tablet 0   memantine (NAMENDA) 10 MG tablet Take 1 tablet (10 mg total) by mouth 2 (two) times daily. 60 tablet 11   nitroGLYCERIN (NITROSTAT) 0.4 MG SL tablet PLACE 1 TABLET UNDER THE TONGUE EVERY 5 MINUTES AS NEEDED FOR CHEST PAIN. 25 tablet 1   pantoprazole  (PROTONIX) 40 MG tablet Take 1 tablet (40 mg total) by mouth daily. 90 tablet 1   pramipexole (MIRAPEX) 1 MG tablet Take 1/2 to 1 tablet (0.5-1 mg total) by mouth at bedtime. 90 tablet 0   QUEtiapine (SEROQUEL) 25 MG tablet Take 1 tablet (25 mg total) by mouth at bedtime. 30 tablet 6   ranolazine (RANEXA) 500 MG 12 hr tablet TAKE 1 TABLET BY MOUTH TWICE DAILY 180 tablet 0   sertraline (ZOLOFT) 100 MG tablet Take 1 tablet (100 mg total) by mouth 2 (two) times daily. 180 tablet 0   No current facility-administered medications for this visit.    Allergies: Morphine and Shellfish-derived products  Past Medical History:  Diagnosis Date   Acquired atrophy of thyroid 06/05/2013   Overview:  July 2014: controlled on synthroid since 2012 May 2015: decreased to Synthroid 50 mcg  Last Assessment & Plan:  Pt doing well with most recent TSH within normal range.  Will continue  current dosage of Synthroid . Pt reminded to take medication on empty stomach. Will continue to monitor with periodic laboratory assessment in 3 months.    Anemia    hemoglobin 7.4, iron deficiency, January, 2011, 2 unit transfusion, endoscopy normal, capsule endoscopy February, 2011 no small bowel abnormalities.   Most likely source gastric erosions, followed by GI   Anxiety    Bradycardia    CAD (coronary artery disease)    A. CABG in 2000,status post cardiac cath in 2006, 2009 ....continued chest pain and SOB despite oral medication adjestments including Ranexa. B. Cath November 2009/ mRCA - 2.75 x 23 Abbott Xience V drug-eluting stent ...11/26/2008 to distal  RCA leading to acute marginal.  C. Cath 07/2012 for CP - stable anatomy, med rx. d. cath 2015 and 05/30/2015 stable anatomy, consider Myoview if has CP again   Carotid artery disease (HCC) 08/01/2015   Doppler, May 29, 2015, 1-39% bilateral ICA    Cerebral ischemia    MRI November, 2010, chronic microvascular ischemia   CKD (chronic kidney disease), stage III (HCC)     Concussion    Depression    Bipolar   Diabetes mellitus (HCC)    Edema    Essential hypertension 06/05/2013   Overview:  July 2014: Controlled with Bystolic Sep 2015: Changed to atenolol.  Last Assessment & Plan:  Will change to atenolol for cost and follow Improved.  Medication compliance strongly encouraged BP: 116/64 mmHg    Overview:  July 2014: Controlled with Bystolic Sep 2015: Changed to atenolol.  Last Assessment & Plan:  Will change to atenolol for cost and follow   Falling episodes    these have occurred in the past and again recurring 2011   Family history of adverse reaction to anesthesia    "mother died during bypass surgery but not sure if it has to do with anesthesia"   Gastric ulcer    GERD (gastroesophageal reflux disease)    H/O medication noncompliance    Due to loss of insurance   H/O multiple concussions    Hard of hearing    Heart murmur    History of blood transfusion 12/20/2013   History of kidney stones    Hx of CABG    2000,  / one median sternotomy suture broken her chest x-ray November, 2010, no clinical significance   Hyperlipidemia    Hypertension    pt. denies   Iron deficiency anemia    Long term (current) use of anticoagulants [Z79.01] 07/20/2016   Low back pain 06/12/2009   Qualifier: Diagnosis of  By: Nena Jordan    Nephrolithiasis    Orthostasis    OSA (obstructive sleep apnea)    PAF (paroxysmal atrial fibrillation) (HCC)    a. dx 2017, started on amiodarone/warfarin but patient intermittently noncompliant.   PSVT (paroxysmal supraventricular tachycardia)    RBBB 07/09/2009   Qualifier: Diagnosis of  By: Myrtis Ser, MD, [facility], Lemmie Evens    RLS (restless legs syndrome) 09/19/2009   Qualifier: Diagnosis of  By: Nena Jordan   Overview:  July 2014: Controlled with Mirapex  Last Assessment & Plan:  Patient is doing well. Will continue current management and follow clinically.   Seizure disorder (HCC) 01/09/2017   Spondylosis     C5-6, C6-7 MRI 2010   Syncope 03/2016   TBI (traumatic brain injury) (HCC) 2019   Thyroid disease    Tubulovillous adenoma of colon 2007   Vitamin D deficiency 05/17/2017  Wears glasses     Past Surgical History:  Procedure Laterality Date   ANTERIOR CERVICAL DECOMP/DISCECTOMY FUSION N/A 05/31/2016   Procedure: ANTERIOR CERVICAL DECOMPRESSION/DISCECTOMY FUSION CERVICAL FIVE-SIX,CERVICAL SIX-SEVEN;  Surgeon: Julio Sicks, MD;  Location: [facility] NEURO ORS;  Service: Neurosurgery;  Laterality: N/A;   CARDIAC CATHETERIZATION N/A 05/30/2015   Procedure: Left Heart Cath and Coronary Angiography;  Surgeon: Marykay Lex, MD;  Location: [facility] INVASIVE CV LAB;  Service: Cardiovascular;  Laterality: N/A;   CATARACT EXTRACTION     COLONOSCOPY     CORONARY ARTERY BYPASS GRAFT     2000   ESOPHAGOGASTRODUODENOSCOPY     LEFT HEART CATH AND CORS/GRAFTS ANGIOGRAPHY N/A 12/15/2017   Procedure: LEFT HEART CATH AND CORS/GRAFTS ANGIOGRAPHY;  Surgeon: Corky Crafts, MD;  Location: [facility] INVASIVE CV LAB;  Service: Cardiovascular;  Laterality: N/A;   LEFT HEART CATHETERIZATION WITH CORONARY/GRAFT ANGIOGRAM N/A 08/01/2012   Procedure: LEFT HEART CATHETERIZATION WITH Isabel Caprice;  Surgeon: Herby Abraham, MD;  Location: [facility] CATH LAB;  Service: Cardiovascular;  Laterality: N/A;   LEFT HEART CATHETERIZATION WITH CORONARY/GRAFT ANGIOGRAM N/A 01/03/2015   Procedure: LEFT HEART CATHETERIZATION WITH Isabel Caprice;  Surgeon: Runell Gess, MD;  Location: [facility] CATH LAB;  Service: Cardiovascular;  Laterality: N/A;   NASAL SEPTUM SURGERY     UP3   PERCUTANEOUS CORONARY STENT INTERVENTION (PCI-S)  10/06/2008   mRCA PCI  2.75 x 23 Abbott Xience V drug-eluting stent    RIGHT/LEFT HEART CATH AND CORONARY/GRAFT ANGIOGRAPHY N/A 01/07/2020   Procedure: RIGHT/LEFT HEART CATH AND CORONARY/GRAFT ANGIOGRAPHY;  Surgeon: Lyn Records, MD;  Location: [facility] INVASIVE CV LAB;  Service: Cardiovascular;  Laterality:  N/A;   TOTAL KNEE ARTHROPLASTY Right 09/22/2021   Procedure: RIGHT TOTAL KNEE ARTHROPLASTY;  Surgeon: Marcene Corning, MD;  Location: [facility] ORS;  Service: Orthopedics;  Laterality: Right;   ULTRASOUND GUIDANCE FOR VASCULAR ACCESS  12/15/2017   Procedure: Ultrasound Guidance For Vascular Access;  Surgeon: Corky Crafts, MD;  Location: [facility] INVASIVE CV LAB;  Service: Cardiovascular;;    Family History  Problem Relation Age of Onset   Pancreatic cancer Brother    Diabetes Brother    Coronary artery disease Brother    Stroke Brother    Diabetes Brother    Diabetes Mother    Heart failure Mother    Heart failure Father    Hypothyroidism Brother    Coronary artery disease Brother    Other Brother        colon surgery   Heart attack Other        Nephew   Irregular heart beat Daughter    Cancer Maternal Grandmother        unknown    Colon cancer Neg Hx    Stomach cancer Neg Hx    Liver cancer Neg Hx    Rectal cancer Neg Hx    Esophageal cancer Neg Hx     Social History   Tobacco Use   Smoking status: Never    Passive exposure: Never   Smokeless tobacco: Never  Substance Use Topics   Alcohol use: No    Alcohol/week: 0.0 standard drinks of alcohol    Comment: stopped drinking in 1998    Subjective:   Requesting to have his sodium level today; notes that his brother was recently hospitalized with low sodium recently and patient wants to be sure that is not what is causing his chronic dizzy spells; is under care of neurology and cardiology; is also working with  Dr. Lonzo Cloud for management of diabetes;   Objective:  Vitals:   07/12/23 0821  BP: 124/62  Pulse: 74  SpO2: 98%  Weight: 165 lb (74.8 kg)  Height: 5\' 8"  (1.727 m)    General: Well developed, well nourished, in no acute distress  Skin : Warm and dry.  Head: Normocephalic and atraumatic  Lungs: Respirations unlabored; clear to auscultation bilaterally without wheeze, rales, rhonchi  CVS exam: normal rate and  regular rhythm.  Neurologic: Alert and oriented; speech intact; face symmetrical; uses cane for stability  Assessment:  1. Dizziness   2. Abnormal laboratory test result     Plan:  Chronic issue- under care of both neurology and cardiology; will check labs per patient request but reassurance that his sodium was normal when last checked in early July;   No follow-ups on file.  Orders Placed This Encounter  Procedures   CBC with Differential/Platelet   Comp Met (CMET)   B12    Requested Prescriptions    No prescriptions requested or ordered in this encounter

## 2023-07-13 DIAGNOSIS — E119 Type 2 diabetes mellitus without complications: Secondary | ICD-10-CM | POA: Diagnosis not present

## 2023-07-13 DIAGNOSIS — Z7982 Long term (current) use of aspirin: Secondary | ICD-10-CM | POA: Diagnosis not present

## 2023-07-13 DIAGNOSIS — Z7901 Long term (current) use of anticoagulants: Secondary | ICD-10-CM | POA: Diagnosis not present

## 2023-07-13 DIAGNOSIS — I10 Essential (primary) hypertension: Secondary | ICD-10-CM | POA: Diagnosis not present

## 2023-07-13 DIAGNOSIS — Z794 Long term (current) use of insulin: Secondary | ICD-10-CM | POA: Diagnosis not present

## 2023-07-15 DIAGNOSIS — Z794 Long term (current) use of insulin: Secondary | ICD-10-CM | POA: Diagnosis not present

## 2023-07-15 DIAGNOSIS — E119 Type 2 diabetes mellitus without complications: Secondary | ICD-10-CM | POA: Diagnosis not present

## 2023-07-15 DIAGNOSIS — Z7982 Long term (current) use of aspirin: Secondary | ICD-10-CM | POA: Diagnosis not present

## 2023-07-15 DIAGNOSIS — I10 Essential (primary) hypertension: Secondary | ICD-10-CM | POA: Diagnosis not present

## 2023-07-15 DIAGNOSIS — Z7901 Long term (current) use of anticoagulants: Secondary | ICD-10-CM | POA: Diagnosis not present

## 2023-07-18 ENCOUNTER — Telehealth: Payer: Self-pay | Admitting: Interventional Cardiology

## 2023-07-18 NOTE — Telephone Encounter (Signed)
Called and spoke with pt daughter reports pt has Alzheimer.  Wants to know if pt can see a regular dentist d/t DM and cardiac issues or if needs to see an oral surgeon.  Reports pt needs to have teeth pulled and get either dentures or implants. Wants to know if Cardiology recommends dental care if it would help with insurance concerns.  (Will care be more likely to be covered) Also would like recommendations on where to go for dental work. Advised will send concern to MD to advise.

## 2023-07-18 NOTE — Telephone Encounter (Signed)
Patient is requesting call back to get information given to him at his last appt that he states he lost. He says Dr. Eldridge Dace gave him the name of another doctor and the #. Requesting this information again.

## 2023-07-19 ENCOUNTER — Ambulatory Visit: Payer: Self-pay | Admitting: Licensed Clinical Social Worker

## 2023-07-19 NOTE — Telephone Encounter (Signed)
I spoke with patient's daughter and gave her recommendations from Dr Eldridge Dace.  She will let us know if referral is needed.

## 2023-07-19 NOTE — Telephone Encounter (Signed)
I think an oral surgeon may be better in this case because he takes a blood thinner.  I would refer to Dr. [name] or Dr. Dorcas Carrow.  Not sure it will affect insurance coverage whether we refer or someone else refers.

## 2023-07-19 NOTE — Telephone Encounter (Signed)
Left message to call office

## 2023-07-19 NOTE — Patient Outreach (Signed)
  Care Coordination  Follow Up Visit Note   07/19/2023 Name: [name] [name] MRN: [id number] DOB: 15-Oct-1946  [name] [name] is a 77 y.o. year old male who sees Bradd Canary, MD for primary care. I spoke with  [name] [name] 's daughter [name] [name] by phone today.  What matters to the patients health and wellness today?    Per Daughter they are starting to notice patient's diease progress.  Other daughter will be moving in with patient to provided additional support.  Family will explore and educate themselves on additional support option for the future. No need goals identified during this encounter.   Goals Addressed             This Visit's Progress    COMPLETED: Caregiver support       Activities and task to complete in order to accomplish goals.   Per your request in have e-mailed information for additional support in the home and information to educate yourself on what options are available for out of home placement.  This information have been e-mailed to [facility] list for Skilled Nursing Care and Assisted living  Educational information When a Loved One needs Long-term care   Brochure on Special Assistance  Top questions people ask when applying for long-term care Medicaid       COMPLETED: Connect for therapy       Activities and task to complete in order to accomplish goals.   Per your request I am sending an advance directive packet,  Have advance directive notarized and provide a copy to provider office  Per Lawanna Kobus you have decided not to move forward with therapy       SDOH assessments and interventions completed:  No   Care Coordination Interventions:  Yes, provided  Interventions Today    Flowsheet Row Most Recent Value  Chronic Disease   Chronic disease during today's visit Hypertension (HTN), Diabetes, Congestive Heart Failure (CHF)  General Interventions   General Interventions Discussed/Reviewed Level of Care  Level of Care --   [reviewed placement process and discussed inhome care options]  Education Interventions   Education Provided Provided Education  Provided Verbal Education On [employer], Applications  Mental Health Interventions   Mental Health Discussed/Reviewed Coping Strategies  [caregiver stress ( active listening, solution focused, task centered, problem solving)]  Safety Interventions   Safety Discussed/Reviewed Home Safety  [patients dau. Florentina Addison will be moving in with him]  Advanced Directive Interventions   Advanced Directives Discussed/Reviewed Advanced Directives Discussed  [will work on completing this week]       Follow up plan: No further intervention required by Social Work at this time  Encounter Outcome:  Pt. Visit Completed   Sammuel Hines, LCSW Social Work Care Coordination  [employer] [facility] [employer] [phone]

## 2023-07-19 NOTE — Telephone Encounter (Signed)
Patient's daughter is returning phone call.  °

## 2023-07-19 NOTE — Patient Instructions (Signed)
Social Work Visit Information  Thank you for taking time to visit with me today. Please don't hesitate to contact me if I can be of assistance to you.   Following are the goals we discussed today:   Goals Addressed             This Visit's Progress    COMPLETED: Caregiver support       Activities and task to complete in order to accomplish goals.   Per your request in have e-mailed information for additional support in the home and information to educate yourself on what options are available for out of home placement.  This information have been e-mailed to [facility] list for Skilled Nursing Care and Assisted living  Educational information When a Loved One needs Long-term care   Brochure on Special Assistance  Top questions people ask when applying for long-term care Medicaid       COMPLETED: Connect for therapy       Activities and task to complete in order to accomplish goals.   Per your request I am sending an advance directive packet,  Have advance directive notarized and provide a copy to provider office  Per Lawanna Kobus you have decided not to move forward with therapy        No follow up scheduled with social work at this time. Patient will call office if needed.  Please call the care guide team at [phone] if you need to cancel or reschedule your appointment.   If you or anyone you know are experiencing a Mental Health or Behavioral Health Crisis or need someone to talk to, please call the Suicide and Crisis Lifeline: 988 call the [place] National Suicide Prevention Lifeline: 903-826-9500 or TTY: (979)695-1704 TTY (581)553-4330) to talk to a trained counselor call 1-800-273-TALK (toll free, 24 hour hotline) go to [facility] [address] [place] [phone])   Patient verbalizes understanding of instructions and care plan provided today and agrees to view in MyChart. Active MyChart status and patient understanding  of how to access instructions and care plan via MyChart confirmed with patient.       Sammuel Hines, LCSW Social Work Care Coordination  [facility] [employer] [phone]

## 2023-07-20 ENCOUNTER — Other Ambulatory Visit: Payer: Self-pay | Admitting: Family Medicine

## 2023-07-20 ENCOUNTER — Other Ambulatory Visit (HOSPITAL_BASED_OUTPATIENT_CLINIC_OR_DEPARTMENT_OTHER): Payer: Self-pay

## 2023-07-20 MED ORDER — ISOSORBIDE MONONITRATE ER 120 MG PO TB24
120.0000 mg | ORAL_TABLET | Freq: Every day | ORAL | 0 refills | Status: DC
  Filled 2023-07-20 – 2023-07-21 (×2): qty 30, 30d supply, fill #0

## 2023-07-21 ENCOUNTER — Other Ambulatory Visit (HOSPITAL_BASED_OUTPATIENT_CLINIC_OR_DEPARTMENT_OTHER): Payer: Self-pay

## 2023-07-21 DIAGNOSIS — Z7901 Long term (current) use of anticoagulants: Secondary | ICD-10-CM | POA: Diagnosis not present

## 2023-07-21 DIAGNOSIS — Z794 Long term (current) use of insulin: Secondary | ICD-10-CM | POA: Diagnosis not present

## 2023-07-21 DIAGNOSIS — E119 Type 2 diabetes mellitus without complications: Secondary | ICD-10-CM | POA: Diagnosis not present

## 2023-07-21 DIAGNOSIS — Z7982 Long term (current) use of aspirin: Secondary | ICD-10-CM | POA: Diagnosis not present

## 2023-07-21 DIAGNOSIS — I10 Essential (primary) hypertension: Secondary | ICD-10-CM | POA: Diagnosis not present

## 2023-07-26 ENCOUNTER — Other Ambulatory Visit (HOSPITAL_BASED_OUTPATIENT_CLINIC_OR_DEPARTMENT_OTHER): Payer: Self-pay

## 2023-07-27 ENCOUNTER — Other Ambulatory Visit: Payer: Self-pay | Admitting: Interventional Cardiology

## 2023-07-27 ENCOUNTER — Other Ambulatory Visit (HOSPITAL_BASED_OUTPATIENT_CLINIC_OR_DEPARTMENT_OTHER): Payer: Self-pay

## 2023-07-27 ENCOUNTER — Other Ambulatory Visit: Payer: Self-pay | Admitting: Family Medicine

## 2023-07-27 ENCOUNTER — Other Ambulatory Visit: Payer: Self-pay

## 2023-07-27 DIAGNOSIS — L308 Other specified dermatitis: Secondary | ICD-10-CM

## 2023-07-27 DIAGNOSIS — I4821 Permanent atrial fibrillation: Secondary | ICD-10-CM

## 2023-07-27 MED ORDER — LEVOCETIRIZINE DIHYDROCHLORIDE 5 MG PO TABS
5.0000 mg | ORAL_TABLET | Freq: Every evening | ORAL | 2 refills | Status: DC
  Filled 2023-07-27: qty 30, 30d supply, fill #0
  Filled 2023-08-26: qty 30, 30d supply, fill #1
  Filled 2023-10-03: qty 30, 30d supply, fill #2

## 2023-07-27 MED ORDER — APIXABAN 5 MG PO TABS
5.0000 mg | ORAL_TABLET | Freq: Two times a day (BID) | ORAL | 5 refills | Status: DC
Start: 2023-07-27 — End: 2024-01-18
  Filled 2023-07-27: qty 60, 30d supply, fill #0
  Filled 2023-08-26: qty 60, 30d supply, fill #1
  Filled 2023-10-03: qty 60, 30d supply, fill #2
  Filled 2023-10-31: qty 60, 30d supply, fill #3
  Filled 2023-12-05: qty 60, 30d supply, fill #4
  Filled 2023-12-28 – 2024-01-06 (×5): qty 60, 30d supply, fill #5

## 2023-07-27 NOTE — Telephone Encounter (Signed)
Eliquis mg refill request received. Patient is 77 years old, weight-74.8kg, Crea-1.35 on 07/12/23, Diagnosis-Afib, and last seen by Jari Favre on 06/22/23. Dose is appropriate based on dosing criteria. Will send in refill to requested pharmacy.

## 2023-07-28 ENCOUNTER — Other Ambulatory Visit (HOSPITAL_BASED_OUTPATIENT_CLINIC_OR_DEPARTMENT_OTHER): Payer: Self-pay

## 2023-08-02 ENCOUNTER — Other Ambulatory Visit (HOSPITAL_BASED_OUTPATIENT_CLINIC_OR_DEPARTMENT_OTHER): Payer: Self-pay

## 2023-08-04 DIAGNOSIS — Z7901 Long term (current) use of anticoagulants: Secondary | ICD-10-CM | POA: Diagnosis not present

## 2023-08-04 DIAGNOSIS — I10 Essential (primary) hypertension: Secondary | ICD-10-CM | POA: Diagnosis not present

## 2023-08-04 DIAGNOSIS — Z794 Long term (current) use of insulin: Secondary | ICD-10-CM | POA: Diagnosis not present

## 2023-08-04 DIAGNOSIS — E119 Type 2 diabetes mellitus without complications: Secondary | ICD-10-CM | POA: Diagnosis not present

## 2023-08-04 DIAGNOSIS — Z7982 Long term (current) use of aspirin: Secondary | ICD-10-CM | POA: Diagnosis not present

## 2023-08-05 ENCOUNTER — Other Ambulatory Visit (HOSPITAL_BASED_OUTPATIENT_CLINIC_OR_DEPARTMENT_OTHER): Payer: Self-pay

## 2023-08-05 ENCOUNTER — Other Ambulatory Visit: Payer: Self-pay

## 2023-08-05 ENCOUNTER — Other Ambulatory Visit: Payer: Self-pay | Admitting: Family Medicine

## 2023-08-05 DIAGNOSIS — G2581 Restless legs syndrome: Secondary | ICD-10-CM

## 2023-08-05 MED ORDER — PRAMIPEXOLE DIHYDROCHLORIDE 1 MG PO TABS
0.5000 mg | ORAL_TABLET | Freq: Every day | ORAL | 0 refills | Status: DC
Start: 2023-08-05 — End: 2023-11-30
  Filled 2023-08-05 – 2023-08-31 (×4): qty 90, 90d supply, fill #0

## 2023-08-11 ENCOUNTER — Ambulatory Visit: Payer: Self-pay

## 2023-08-11 NOTE — Patient Instructions (Signed)
Visit Information  Thank you for taking time to visit with me today. Please don't hesitate to contact me if I can be of assistance to you.   Following are the goals we discussed today:  Continue to take medications as prescribed Continue to attend provider visits as scheduled/recommended Contact your provider with any health questions or concerns   If you are experiencing a Mental Health or Behavioral Health Crisis or need someone to talk to, please call the Suicide and Crisis Lifeline: 988 call the [place] National Suicide Prevention Lifeline: (203) 733-4483 or TTY: 541-787-0263 TTY 430-575-5380) to talk to a trained counselor call 1-800-273-TALK (toll free, 24 hour hotline)  Kathyrn Sheriff, RN, MSN, BSN, CCM Care Management Coordinator [phone]

## 2023-08-11 NOTE — Patient Outreach (Signed)
  Care Coordination   Follow Up Visit Note   08/11/2023 Name: [name] [name] MRN: [id number] DOB: 07/20/1946  [name] [name] is a 77 y.o. year old male who sees [name] Canary, MD for primary care. I spoke with patient's daughter, [name] [name] by phone today.  What matters to the patients health and wellness today?  Per [name] [name], "I feel like he is doing pretty well". She denies any questions or concerns at this time. She denies any care coordination needs. [name] [name] reports she will contact RNCM or Primary Provider if care coordination needs in the future.   Goals Addressed             This Visit's Progress    COMPLETED: Assistance with health management       Interventions Today    Flowsheet Row Most Recent Value  Chronic Disease   Chronic disease during today's visit Other  General Interventions   General Interventions Discussed/Reviewed General Interventions Reviewed, Doctor Visits  [advised to contact RNCM and or primary care provider if care coordination needs in the future. Conformed with daughter [name] [name] they have RNCM's contact number.]  Doctor Visits Discussed/Reviewed PCP, Doctor Visits Reviewed  PCP/Specialist Visits Compliance with follow-up visit  [reviewed upcoming/scheduled appointments]            SDOH assessments and interventions completed:  No  Care Coordination Interventions:  Yes, provided   Follow up plan: No further intervention required.   Encounter Outcome:  Patient Visit Completed   Kathyrn Sheriff, RN, MSN, BSN, CCM Care Management Coordinator [phone]

## 2023-08-18 DIAGNOSIS — E119 Type 2 diabetes mellitus without complications: Secondary | ICD-10-CM | POA: Diagnosis not present

## 2023-08-18 DIAGNOSIS — Z7982 Long term (current) use of aspirin: Secondary | ICD-10-CM | POA: Diagnosis not present

## 2023-08-18 DIAGNOSIS — Z794 Long term (current) use of insulin: Secondary | ICD-10-CM | POA: Diagnosis not present

## 2023-08-18 DIAGNOSIS — I10 Essential (primary) hypertension: Secondary | ICD-10-CM | POA: Diagnosis not present

## 2023-08-18 DIAGNOSIS — Z7901 Long term (current) use of anticoagulants: Secondary | ICD-10-CM | POA: Diagnosis not present

## 2023-08-26 ENCOUNTER — Other Ambulatory Visit (HOSPITAL_BASED_OUTPATIENT_CLINIC_OR_DEPARTMENT_OTHER): Payer: Self-pay

## 2023-08-26 ENCOUNTER — Other Ambulatory Visit: Payer: Self-pay

## 2023-08-26 ENCOUNTER — Other Ambulatory Visit: Payer: Self-pay | Admitting: Family Medicine

## 2023-08-26 MED ORDER — ATORVASTATIN CALCIUM 80 MG PO TABS
80.0000 mg | ORAL_TABLET | Freq: Every day | ORAL | 0 refills | Status: DC
  Filled 2023-08-26 – 2023-08-31 (×4): qty 90, 90d supply, fill #0

## 2023-08-26 MED ORDER — ISOSORBIDE MONONITRATE ER 120 MG PO TB24
120.0000 mg | ORAL_TABLET | Freq: Every day | ORAL | 0 refills | Status: DC
  Filled 2023-08-26: qty 30, 30d supply, fill #0

## 2023-08-29 ENCOUNTER — Other Ambulatory Visit: Payer: Self-pay | Admitting: Family Medicine

## 2023-08-29 ENCOUNTER — Other Ambulatory Visit: Payer: Self-pay

## 2023-08-29 ENCOUNTER — Other Ambulatory Visit (HOSPITAL_BASED_OUTPATIENT_CLINIC_OR_DEPARTMENT_OTHER): Payer: Self-pay

## 2023-08-29 DIAGNOSIS — G4700 Insomnia, unspecified: Secondary | ICD-10-CM

## 2023-08-29 DIAGNOSIS — F32A Depression, unspecified: Secondary | ICD-10-CM

## 2023-08-29 MED ORDER — AMITRIPTYLINE HCL 10 MG PO TABS
10.0000 mg | ORAL_TABLET | Freq: Every day | ORAL | 1 refills | Status: DC
Start: 2023-08-29 — End: 2023-10-31
  Filled 2023-08-29: qty 90, 30d supply, fill #0
  Filled 2023-10-03: qty 90, 30d supply, fill #1

## 2023-08-29 MED ORDER — SERTRALINE HCL 100 MG PO TABS
100.0000 mg | ORAL_TABLET | Freq: Two times a day (BID) | ORAL | 0 refills | Status: DC
  Filled 2023-08-29 (×2): qty 180, 90d supply, fill #0

## 2023-08-30 ENCOUNTER — Other Ambulatory Visit (HOSPITAL_BASED_OUTPATIENT_CLINIC_OR_DEPARTMENT_OTHER): Payer: Self-pay

## 2023-08-31 ENCOUNTER — Other Ambulatory Visit: Payer: Self-pay | Admitting: Family Medicine

## 2023-08-31 ENCOUNTER — Other Ambulatory Visit (HOSPITAL_BASED_OUTPATIENT_CLINIC_OR_DEPARTMENT_OTHER): Payer: Self-pay

## 2023-08-31 MED ORDER — HYDROXYZINE HCL 10 MG PO TABS
10.0000 mg | ORAL_TABLET | Freq: Three times a day (TID) | ORAL | 1 refills | Status: DC | PRN
  Filled 2023-08-31: qty 30, 10d supply, fill #0
  Filled 2023-11-10: qty 30, 10d supply, fill #1

## 2023-09-01 ENCOUNTER — Encounter: Payer: Self-pay | Admitting: Physician Assistant

## 2023-09-01 ENCOUNTER — Other Ambulatory Visit (HOSPITAL_BASED_OUTPATIENT_CLINIC_OR_DEPARTMENT_OTHER): Payer: Self-pay

## 2023-09-01 ENCOUNTER — Ambulatory Visit (INDEPENDENT_AMBULATORY_CARE_PROVIDER_SITE_OTHER): Payer: Medicare Other | Admitting: Physician Assistant

## 2023-09-01 VITALS — BP 126/66 | HR 70 | Temp 97.5°F | Resp 16 | Ht 68.0 in | Wt 166.0 lb

## 2023-09-01 DIAGNOSIS — Z794 Long term (current) use of insulin: Secondary | ICD-10-CM | POA: Diagnosis not present

## 2023-09-01 DIAGNOSIS — E1142 Type 2 diabetes mellitus with diabetic polyneuropathy: Secondary | ICD-10-CM

## 2023-09-01 MED ORDER — COMIRNATY 30 MCG/0.3ML IM SUSY
PREFILLED_SYRINGE | INTRAMUSCULAR | 0 refills | Status: DC
  Filled 2023-09-01: qty 0.3, 1d supply, fill #0

## 2023-09-01 NOTE — Progress Notes (Signed)
Established patient visit   Patient: [name] [name]   DOB: 1946/02/20   77 y.o. Male  MRN: [id number] Visit Date: 09/01/2023  Today's healthcare provider: Alfredia Ferguson, PA-C  Cc. B/l LE numbness  Subjective    HPI  Pt reports worsening numbness of b/l LE. Starts bottom of feet up to mid calf. Denies pain, wounds.   Medications: Outpatient Medications Prior to Visit  Medication Sig   amiodarone (PACERONE) 200 MG tablet Take 1 tablet (200 mg total) by mouth daily.   amitriptyline (ELAVIL) 10 MG tablet Take 1-3 tablets (10-30 mg total) by mouth at bedtime.   apixaban (ELIQUIS) 5 MG TABS tablet Take 1 tablet (5 mg total) by mouth 2 (two) times daily.   aspirin EC 81 MG tablet Take 81 mg by mouth daily. Swallow whole.   atorvastatin (LIPITOR) 80 MG tablet Take 1 tablet (80 mg total) by mouth daily.   Continuous Glucose Sensor (FREESTYLE LIBRE 3 SENSOR) MISC Place 1 sensor on the skin every 14 days. Use to check glucose continuously   dapagliflozin propanediol (FARXIGA) 10 MG TABS tablet Take 1 tablet (10 mg total) by mouth daily.   donepezil (ARICEPT) 10 MG tablet Take 1 tablet (10 mg total) by mouth at bedtime.   glucose blood (ACCU-CHEK AVIVA) test strip Use as directed to check blood sugar 4 times daily.  DX E11.9   hydrocortisone 2.5 % cream Apply 1 Application topically 2 (two) times daily.   hydrOXYzine (ATARAX) 10 MG tablet Take 1 tablet (10 mg total) by mouth 3 (three) times daily as needed for anxiety or itching.   insulin glargine, 2 Unit Dial, (TOUJEO MAX SOLOSTAR) 300 UNIT/ML Solostar Pen Inject 60 Units into the skin daily in the afternoon.   insulin lispro (HUMALOG KWIKPEN) 100 UNIT/ML KwikPen Inject 10 Units into the skin 3 (three) times daily. (Patient taking differently: Inject 12 Units into the skin 3 (three) times daily. Plus correction blood glucose- 130 / 25)   Insulin Pen Needle 31G X 5 MM MISC Use as directed to inject insulin in the morning, at noon, in  the evening, and at bedtime.   isosorbide mononitrate (IMDUR) 120 MG 24 hr tablet Take 1 tablet (120 mg total) by mouth daily.   levocetirizine (XYZAL) 5 MG tablet Take 1 tablet (5 mg total) by mouth every evening.   levothyroxine (SYNTHROID) 100 MCG tablet Take 1 tablet (100 mcg total) by mouth daily before breakfast.   memantine (NAMENDA) 10 MG tablet Take 1 tablet (10 mg total) by mouth 2 (two) times daily.   pantoprazole (PROTONIX) 40 MG tablet Take 1 tablet (40 mg total) by mouth daily.   pramipexole (MIRAPEX) 1 MG tablet Take 1/2 to 1 tablet (0.5-1 mg total) by mouth at bedtime.   QUEtiapine (SEROQUEL) 25 MG tablet Take 1 tablet (25 mg total) by mouth at bedtime.   ranolazine (RANEXA) 500 MG 12 hr tablet TAKE 1 TABLET BY MOUTH TWICE DAILY   sertraline (ZOLOFT) 100 MG tablet Take 1 tablet (100 mg total) by mouth 2 (two) times daily.   nitroGLYCERIN (NITROSTAT) 0.4 MG SL tablet PLACE 1 TABLET UNDER THE TONGUE EVERY 5 MINUTES AS NEEDED FOR CHEST PAIN. (Patient not taking: Reported on 09/01/2023)   No facility-administered medications prior to visit.    Review of Systems  Constitutional:  Negative for fatigue and fever.  Respiratory:  Negative for cough and shortness of breath.   Cardiovascular:  Negative for chest pain, palpitations and  leg swelling.  Neurological:  Negative for dizziness and headaches.      Objective    BP 126/66   Pulse 70   Temp (!) 97.5 F (36.4 C) (Oral)   Resp 16   Ht 5\' 8"  (1.727 m)   Wt 166 lb (75.3 kg)   SpO2 96%   BMI 25.24 kg/m   Physical Exam Vitals reviewed.  Constitutional:      Appearance: He is not ill-appearing.  HENT:     Head: Normocephalic.  Eyes:     Conjunctiva/sclera: Conjunctivae normal.  Cardiovascular:     Rate and Rhythm: Normal rate.     Pulses:          Dorsalis pedis pulses are 3+ on the right side and 3+ on the left side.       Posterior tibial pulses are 2+ on the right side and 2+ on the left side.  Pulmonary:      Effort: Pulmonary effort is normal. No respiratory distress.  Feet:     Right foot:     Protective Sensation: 5 sites tested.  2 sites sensed.     Skin integrity: Skin integrity normal.     Toenail Condition: Right toenails are normal.     Left foot:     Protective Sensation: 5 sites tested.  2 sites sensed.     Skin integrity: Skin integrity normal.     Toenail Condition: Left toenails are normal.     Comments: Pt able to feel some sites--top of feet sharper than bottom. Bottom of foot pt unable to sense.  No wounds seen. Poor odor. Some hyperpigmentation around ankles. Neurological:     General: No focal deficit present.     Mental Status: He is alert and oriented to person, place, and time.  Psychiatric:        Mood and Affect: Mood normal.        Behavior: Behavior normal.      No results found for any visits on 09/01/23.  Assessment & Plan     1. Type 2 diabetes mellitus with diabetic polyneuropathy, with long-term current use of insulin (HCC) Will check some labs., likely sequela of DM2 poor control . Will refer to podiatry going forward given high risk of infection/injury w/ numbness  - Vitamin B12 - TSH - T4, free - Comp Met (CMET) - HgB A1c - Ambulatory referral to Podiatry   Return if symptoms worsen or fail to improve.      I, Alfredia Ferguson, PA-C have reviewed all documentation for this visit. The documentation on  09/01/23   for the exam, diagnosis, procedures, and orders are all accurate and complete.    Alfredia Ferguson, PA-C  [facility] at [facility] [phone] (phone) [phone] (fax)  [facility]

## 2023-09-01 NOTE — Addendum Note (Signed)
Addended by: Mervin Kung A on: 09/01/2023 03:20 PM   Modules accepted: Orders

## 2023-09-02 LAB — COMPREHENSIVE METABOLIC PANEL
ALT: 36 U/L (ref 0–53)
AST: 28 U/L (ref 0–37)
Albumin: 4.2 g/dL (ref 3.5–5.2)
Alkaline Phosphatase: 102 U/L (ref 39–117)
BUN: 22 mg/dL (ref 6–23)
CO2: 27 meq/L (ref 19–32)
Calcium: 9.2 mg/dL (ref 8.4–10.5)
Chloride: 96 meq/L (ref 96–112)
Creatinine, Ser: 1.39 mg/dL (ref 0.40–1.50)
GFR: 49.78 mL/min — ABNORMAL LOW (ref >60.00)
Glucose, Bld: 396 mg/dL — ABNORMAL HIGH (ref 70–99)
Potassium: 4.2 meq/L (ref 3.5–5.1)
Sodium: 133 meq/L — ABNORMAL LOW (ref 135–145)
Total Bilirubin: 0.8 mg/dL (ref 0.2–1.2)
Total Protein: 6.6 g/dL (ref 6.0–8.3)

## 2023-09-02 LAB — T4, FREE: Free T4: 0.84 ng/dL (ref 0.60–1.60)

## 2023-09-02 LAB — VITAMIN B12: Vitamin B-12: 523 pg/mL (ref 211–911)

## 2023-09-02 LAB — TSH: TSH: 10.12 u[IU]/mL — ABNORMAL HIGH (ref 0.35–5.50)

## 2023-09-02 LAB — HEMOGLOBIN A1C: Hgb A1c MFr Bld: 10.3 % — ABNORMAL HIGH (ref 4.6–6.5)

## 2023-09-05 ENCOUNTER — Other Ambulatory Visit: Payer: Self-pay

## 2023-09-05 ENCOUNTER — Other Ambulatory Visit (HOSPITAL_BASED_OUTPATIENT_CLINIC_OR_DEPARTMENT_OTHER): Payer: Self-pay

## 2023-09-05 MED ORDER — LEVOTHYROXINE SODIUM 125 MCG PO TABS
125.0000 ug | ORAL_TABLET | Freq: Every day | ORAL | 3 refills | Status: DC
  Filled 2023-09-05: qty 90, 90d supply, fill #0
  Filled 2023-11-30: qty 90, 90d supply, fill #1
  Filled 2023-12-28 – 2024-01-18 (×3): qty 90, 90d supply, fill #2
  Filled 2024-02-27: qty 30, 30d supply, fill #2
  Filled 2024-03-21 – 2024-03-22 (×2): qty 30, 30d supply, fill #3
  Filled 2024-04-10 – 2024-04-17 (×2): qty 30, 30d supply, fill #4
  Filled ????-??-?? (×2): fill #2

## 2023-09-07 NOTE — Assessment & Plan Note (Signed)
Well controlled, no changes to meds. Encouraged heart healthy diet such as the DASH diet and exercise as tolerated.  °

## 2023-09-07 NOTE — Assessment & Plan Note (Signed)
No recent palpitations. 

## 2023-09-07 NOTE — Assessment & Plan Note (Signed)
Hydrate and monitor 

## 2023-09-07 NOTE — Assessment & Plan Note (Signed)
Encourage heart healthy diet such as MIND or DASH diet, increase exercise, avoid trans fats, simple carbohydrates and processed foods, consider a krill or fish or flaxseed oil cap daily. Tolerating Atorvastatin 

## 2023-09-07 NOTE — Assessment & Plan Note (Signed)
hgba1c acceptable, minimize simple carbs. Increase exercise as tolerated. Continue current meds 

## 2023-09-07 NOTE — Assessment & Plan Note (Signed)
Supplement and monitor 

## 2023-09-07 NOTE — Assessment & Plan Note (Signed)
Continues to live at home with family support

## 2023-09-08 ENCOUNTER — Ambulatory Visit: Payer: Medicare Other | Admitting: Family Medicine

## 2023-09-08 ENCOUNTER — Other Ambulatory Visit (HOSPITAL_BASED_OUTPATIENT_CLINIC_OR_DEPARTMENT_OTHER): Payer: Self-pay

## 2023-09-08 ENCOUNTER — Other Ambulatory Visit: Payer: Self-pay | Admitting: Family Medicine

## 2023-09-08 ENCOUNTER — Other Ambulatory Visit: Payer: Self-pay

## 2023-09-08 VITALS — BP 124/68 | HR 77 | Temp 98.0°F | Resp 16 | Ht 68.0 in | Wt 167.0 lb

## 2023-09-08 DIAGNOSIS — N184 Chronic kidney disease, stage 4 (severe): Secondary | ICD-10-CM | POA: Diagnosis not present

## 2023-09-08 DIAGNOSIS — E559 Vitamin D deficiency, unspecified: Secondary | ICD-10-CM

## 2023-09-08 DIAGNOSIS — E785 Hyperlipidemia, unspecified: Secondary | ICD-10-CM

## 2023-09-08 DIAGNOSIS — E1159 Type 2 diabetes mellitus with other circulatory complications: Secondary | ICD-10-CM

## 2023-09-08 DIAGNOSIS — Z23 Encounter for immunization: Secondary | ICD-10-CM

## 2023-09-08 DIAGNOSIS — Z794 Long term (current) use of insulin: Secondary | ICD-10-CM | POA: Diagnosis not present

## 2023-09-08 DIAGNOSIS — F0390 Unspecified dementia without behavioral disturbance: Secondary | ICD-10-CM

## 2023-09-08 DIAGNOSIS — I480 Paroxysmal atrial fibrillation: Secondary | ICD-10-CM

## 2023-09-08 DIAGNOSIS — I10 Essential (primary) hypertension: Secondary | ICD-10-CM | POA: Diagnosis not present

## 2023-09-08 MED ORDER — VENLAFAXINE HCL ER 37.5 MG PO CP24
37.5000 mg | ORAL_CAPSULE | Freq: Every day | ORAL | 1 refills | Status: DC
  Filled 2023-09-08: qty 7, 7d supply, fill #0
  Filled 2023-09-09 – 2023-10-04 (×3): qty 7, 7d supply, fill #1

## 2023-09-08 MED ORDER — SERTRALINE HCL 100 MG PO TABS: 100.0000 mg | ORAL_TABLET | Freq: Every day | ORAL | Status: DC

## 2023-09-08 MED ORDER — VENLAFAXINE HCL ER 75 MG PO CP24
75.0000 mg | ORAL_CAPSULE | Freq: Every day | ORAL | 1 refills | Status: DC
  Filled 2023-09-08: qty 90, 90d supply, fill #0
  Filled 2023-11-30: qty 90, 90d supply, fill #1

## 2023-09-08 MED ORDER — DONEPEZIL HCL 10 MG PO TABS
10.0000 mg | ORAL_TABLET | Freq: Every day | ORAL | 1 refills | Status: DC
  Filled 2023-09-08 (×2): qty 90, 90d supply, fill #0

## 2023-09-08 MED ORDER — TETANUS-DIPHTH-ACELL PERTUSSIS 5-2.5-18.5 LF-MCG/0.5 IM SUSY
0.5000 mL | PREFILLED_SYRINGE | Freq: Once | INTRAMUSCULAR | 0 refills | Status: AC
  Filled 2023-09-08: qty 0.5, 1d supply, fill #0

## 2023-09-08 NOTE — Patient Instructions (Signed)
Tetanus at pharmacy

## 2023-09-09 ENCOUNTER — Other Ambulatory Visit (HOSPITAL_BASED_OUTPATIENT_CLINIC_OR_DEPARTMENT_OTHER): Payer: Self-pay

## 2023-09-09 ENCOUNTER — Ambulatory Visit: Payer: Medicare Other | Admitting: Podiatry

## 2023-09-11 ENCOUNTER — Encounter: Payer: Self-pay | Admitting: Family Medicine

## 2023-09-11 NOTE — Progress Notes (Signed)
Subjective:    Patient ID: Arcelia Jew, male    DOB: 1946-07-23, 77 y.o.   MRN: [id number]  Chief Complaint  Patient presents with  . Follow-up    Follow up    HPI Discussed the use of AI scribe software for clinical note transcription with the patient, who gave verbal consent to proceed.  History of Present Illness   The patient, with a history of Alzheimer's, traumatic brain injury, diabetes, and thyroid issues, presents with memory loss, dizziness, agitation, and numbness in the legs and feet. The patient's memory loss is attributed to both Alzheimer's and the traumatic brain injury, with the patient reporting difficulty remembering things and becoming easily agitated. The patient's dizziness varies in intensity and is sometimes so severe that the patient has to keep his head down to avoid falling. The patient's diabetes is currently managed with medication, but the patient reports fluctuations in blood sugar levels, with occasional drops to as low as 50. The patient's thyroid issues are managed with levothyroxine, but recent tests show that the patient's TSH levels are still high, indicating an underactive thyroid. The patient's daughter expresses concern about the cost of the patient's Eliquis medication.        Past Medical History:  Diagnosis Date  . Acquired atrophy of thyroid 06/05/2013   Overview:  July 2014: controlled on synthroid since 2012 May 2015: decreased to Synthroid 50 mcg  Last Assessment & Plan:  Pt doing well with most recent TSH within normal range.  Will continue current dosage of Synthroid . Pt reminded to take medication on empty stomach. Will continue to monitor with periodic laboratory assessment in 3 months.   . Anemia    hemoglobin 7.4, iron deficiency, January, 2011, 2 unit transfusion, endoscopy normal, capsule endoscopy February, 2011 no small bowel abnormalities.   Most likely source gastric erosions, followed by GI  . Anxiety   .  Bradycardia   . CAD (coronary artery disease)    A. CABG in 2000,status post cardiac cath in 2006, 2009 ....continued chest pain and SOB despite oral medication adjestments including Ranexa. B. Cath November 2009/ mRCA - 2.75 x 23 Abbott Xience V drug-eluting stent ...11/26/2008 to distal  RCA leading to acute marginal.  C. Cath 07/2012 for CP - stable anatomy, med rx. d. cath 2015 and 05/30/2015 stable anatomy, consider Myoview if has CP again  . Carotid artery disease (HCC) 08/01/2015   Doppler, May 29, 2015, 1-39% bilateral ICA   . Cerebral ischemia    MRI November, 2010, chronic microvascular ischemia  . CKD (chronic kidney disease), stage III (HCC)   . Concussion   . Depression    Bipolar  . Diabetes mellitus (HCC)   . Edema   . Essential hypertension 06/05/2013   Overview:  July 2014: Controlled with Bystolic Sep 2015: Changed to atenolol.  Last Assessment & Plan:  Will change to atenolol for cost and follow Improved.  Medication compliance strongly encouraged BP: 116/64 mmHg    Overview:  July 2014: Controlled with Bystolic Sep 2015: Changed to atenolol.  Last Assessment & Plan:  Will change to atenolol for cost and follow  . Falling episodes    these have occurred in the past and again recurring 2011  . Family history of adverse reaction to anesthesia    "mother died during bypass surgery but not sure if it has to do with anesthesia"  . Gastric ulcer   . GERD (gastroesophageal reflux disease)   . H/O  medication noncompliance    Due to loss of insurance  . H/O multiple concussions   . Hard of hearing   . Heart murmur   . History of blood transfusion 12/20/2013  . History of kidney stones   . Hx of CABG    2000,  / one median sternotomy suture broken her chest x-ray November, 2010, no clinical significance  . Hyperlipidemia   . Hypertension    pt. denies  . Iron deficiency anemia   . Long term (current) use of anticoagulants [Z79.01] 07/20/2016  . Low back pain 06/12/2009    Qualifier: Diagnosis of  By: Nena Jordan   . Nephrolithiasis   . Orthostasis   . OSA (obstructive sleep apnea)   . PAF (paroxysmal atrial fibrillation) (HCC)    a. dx 2017, started on amiodarone/warfarin but patient intermittently noncompliant.  Marland Kitchen PSVT (paroxysmal supraventricular tachycardia)   . RBBB 07/09/2009   Qualifier: Diagnosis of  By: Myrtis Ser, MD, Ruthann Cancer Lemmie Evens   . RLS (restless legs syndrome) 09/19/2009   Qualifier: Diagnosis of  By: Nena Jordan   Overview:  July 2014: Controlled with Mirapex  Last Assessment & Plan:  Patient is doing well. Will continue current management and follow clinically.  . Seizure disorder (HCC) 01/09/2017  . Spondylosis    C5-6, C6-7 MRI 2010  . Syncope 03/2016  . TBI (traumatic brain injury) (HCC) 2019  . Thyroid disease   . Tubulovillous adenoma of colon 2007  . Vitamin D deficiency 05/17/2017  . Wears glasses     Past Surgical History:  Procedure Laterality Date  . ANTERIOR CERVICAL DECOMP/DISCECTOMY FUSION N/A 05/31/2016   Procedure: ANTERIOR CERVICAL DECOMPRESSION/DISCECTOMY FUSION CERVICAL FIVE-SIX,CERVICAL SIX-SEVEN;  Surgeon: Julio Sicks, MD;  Location: [facility] NEURO ORS;  Service: Neurosurgery;  Laterality: N/A;  . CARDIAC CATHETERIZATION N/A 05/30/2015   Procedure: Left Heart Cath and Coronary Angiography;  Surgeon: Marykay Lex, MD;  Location: [facility] INVASIVE CV LAB;  Service: Cardiovascular;  Laterality: N/A;  . CATARACT EXTRACTION    . COLONOSCOPY    . CORONARY ARTERY BYPASS GRAFT     2000  . ESOPHAGOGASTRODUODENOSCOPY    . LEFT HEART CATH AND CORS/GRAFTS ANGIOGRAPHY N/A 12/15/2017   Procedure: LEFT HEART CATH AND CORS/GRAFTS ANGIOGRAPHY;  Surgeon: Corky Crafts, MD;  Location: [facility] INVASIVE CV LAB;  Service: Cardiovascular;  Laterality: N/A;  . LEFT HEART CATHETERIZATION WITH CORONARY/GRAFT ANGIOGRAM N/A 08/01/2012   Procedure: LEFT HEART CATHETERIZATION WITH Isabel Caprice;  Surgeon: Herby Abraham, MD;   Location: [facility] CATH LAB;  Service: Cardiovascular;  Laterality: N/A;  . LEFT HEART CATHETERIZATION WITH CORONARY/GRAFT ANGIOGRAM N/A 01/03/2015   Procedure: LEFT HEART CATHETERIZATION WITH Isabel Caprice;  Surgeon: Runell Gess, MD;  Location: [facility] CATH LAB;  Service: Cardiovascular;  Laterality: N/A;  . NASAL SEPTUM SURGERY     UP3  . PERCUTANEOUS CORONARY STENT INTERVENTION (PCI-S)  10/06/2008   mRCA PCI  2.75 x 23 Abbott Xience V drug-eluting stent   . RIGHT/LEFT HEART CATH AND CORONARY/GRAFT ANGIOGRAPHY N/A 01/07/2020   Procedure: RIGHT/LEFT HEART CATH AND CORONARY/GRAFT ANGIOGRAPHY;  Surgeon: Lyn Records, MD;  Location: [facility] INVASIVE CV LAB;  Service: Cardiovascular;  Laterality: N/A;  . TOTAL KNEE ARTHROPLASTY Right 09/22/2021   Procedure: RIGHT TOTAL KNEE ARTHROPLASTY;  Surgeon: Marcene Corning, MD;  Location: [facility] ORS;  Service: Orthopedics;  Laterality: Right;  . ULTRASOUND GUIDANCE FOR VASCULAR ACCESS  12/15/2017   Procedure: Ultrasound Guidance For Vascular Access;  Surgeon: Lance Muss  S, MD;  Location: [facility] INVASIVE CV LAB;  Service: Cardiovascular;;    Family History  Problem Relation Age of Onset  . Pancreatic cancer Brother   . Diabetes Brother   . Coronary artery disease Brother   . Stroke Brother   . Diabetes Brother   . Diabetes Mother   . Heart failure Mother   . Heart failure Father   . Hypothyroidism Brother   . Coronary artery disease Brother   . Other Brother        colon surgery  . Heart attack Other        Nephew  . Irregular heart beat Daughter   . Cancer Maternal Grandmother        unknown   . Colon cancer Neg Hx   . Stomach cancer Neg Hx   . Liver cancer Neg Hx   . Rectal cancer Neg Hx   . Esophageal cancer Neg Hx     Social History   Socioeconomic History  . Marital status: Married    Spouse name: Not on file  . Number of children: 4  . Years of education: 27  . Highest education level: Not on file  Occupational History  .  Occupation: retired  Tobacco Use  . Smoking status: Never    Passive exposure: Never  . Smokeless tobacco: Never  Vaping Use  . Vaping status: Never Used  Substance and Sexual Activity  . Alcohol use: No    Alcohol/week: 0.0 standard drinks of alcohol    Comment: stopped drinking in 1998  . Drug use: No  . Sexual activity: Not Currently    Partners: Female  Other Topics Concern  . Not on file  Social History Narrative   Patient is right handed.   Patient does not drink caffeine.   Social Determinants of Health   Financial Resource Strain: Low Risk  (05/24/2023)   Overall Financial Resource Strain (CARDIA)   . Difficulty of Paying Living Expenses: Not very hard  Food Insecurity: No Food Insecurity (05/24/2023)   Hunger Vital Sign   . Worried About Programme researcher, broadcasting/film/video in the Last Year: Never true   . Ran Out of Food in the Last Year: Never true  Transportation Needs: No Transportation Needs (05/24/2023)   PRAPARE - Transportation   . Lack of Transportation (Medical): No   . Lack of Transportation (Non-Medical): No  Physical Activity: Insufficiently Active (08/02/2022)   Exercise Vital Sign   . Days of Exercise per Week: 7 days   . Minutes of Exercise per Session: 20 min  Stress: No Stress Concern Present (05/30/2023)   [employer] of Occupational Health - Occupational Stress Questionnaire   . Feeling of Stress : Only a little  Social Connections: Moderately Isolated (08/02/2022)   Social Connection and Isolation Panel [NHANES]   . Frequency of Communication with Friends and Family: More than three times a week   . Frequency of Social Gatherings with Friends and Family: More than three times a week   . Attends Religious Services: More than 4 times per year   . Active Member of Clubs or Organizations: No   . Attends Banker Meetings: Never   . Marital Status: Widowed  Intimate Partner Violence: Not At Risk (05/30/2023)   Humiliation, Afraid, Rape, and Kick  questionnaire   . Fear of Current or Ex-Partner: No   . Emotionally Abused: No   . Physically Abused: No   . Sexually Abused: No    Outpatient  Medications Prior to Visit  Medication Sig Dispense Refill  . amiodarone (PACERONE) 200 MG tablet Take 1 tablet (200 mg total) by mouth daily. 90 tablet 3  . amitriptyline (ELAVIL) 10 MG tablet Take 1-3 tablets (10-30 mg total) by mouth at bedtime. 90 tablet 1  . apixaban (ELIQUIS) 5 MG TABS tablet Take 1 tablet (5 mg total) by mouth 2 (two) times daily. 60 tablet 5  . aspirin EC 81 MG tablet Take 81 mg by mouth daily. Swallow whole.    Marland Kitchen atorvastatin (LIPITOR) 80 MG tablet Take 1 tablet (80 mg total) by mouth daily. 90 tablet 0  . Continuous Glucose Sensor (FREESTYLE LIBRE 3 SENSOR) MISC Place 1 sensor on the skin every 14 days. Use to check glucose continuously 6 each 3  . COVID-19 mRNA vaccine, Pfizer, (COMIRNATY) syringe Inject into the muscle. 0.3 mL 0  . dapagliflozin propanediol (FARXIGA) 10 MG TABS tablet Take 1 tablet (10 mg total) by mouth daily. 90 tablet 3  . donepezil (ARICEPT) 10 MG tablet Take 1 tablet (10 mg total) by mouth at bedtime. 90 tablet 1  . glucose blood (ACCU-CHEK AVIVA) test strip Use as directed to check blood sugar 4 times daily.  DX E11.9 200 each 6  . hydrocortisone 2.5 % cream Apply 1 Application topically 2 (two) times daily.    . hydrOXYzine (ATARAX) 10 MG tablet Take 1 tablet (10 mg total) by mouth 3 (three) times daily as needed for anxiety or itching. 30 tablet 1  . insulin glargine, 2 Unit Dial, (TOUJEO MAX SOLOSTAR) 300 UNIT/ML Solostar Pen Inject 60 Units into the skin daily in the afternoon. 30 mL 2  . insulin lispro (HUMALOG KWIKPEN) 100 UNIT/ML KwikPen Inject 10 Units into the skin 3 (three) times daily. (Patient taking differently: Inject 12 Units into the skin 3 (three) times daily. Plus correction blood glucose- 130 / 25) 45 mL 3  . Insulin Pen Needle 31G X 5 MM MISC Use as directed to inject insulin in the  morning, at noon, in the evening, and at bedtime. 400 each 3  . isosorbide mononitrate (IMDUR) 120 MG 24 hr tablet Take 1 tablet (120 mg total) by mouth daily. 30 tablet 0  . levocetirizine (XYZAL) 5 MG tablet Take 1 tablet (5 mg total) by mouth every evening. 30 tablet 2  . levothyroxine (SYNTHROID) 125 MCG tablet Take 1 tablet (125 mcg total) by mouth daily. 90 tablet 3  . memantine (NAMENDA) 10 MG tablet Take 1 tablet (10 mg total) by mouth 2 (two) times daily. 60 tablet 11  . nitroGLYCERIN (NITROSTAT) 0.4 MG SL tablet PLACE 1 TABLET UNDER THE TONGUE EVERY 5 MINUTES AS NEEDED FOR CHEST PAIN. 25 tablet 1  . pantoprazole (PROTONIX) 40 MG tablet Take 1 tablet (40 mg total) by mouth daily. 90 tablet 1  . pramipexole (MIRAPEX) 1 MG tablet Take 1/2 to 1 tablet (0.5-1 mg total) by mouth at bedtime. 90 tablet 0  . QUEtiapine (SEROQUEL) 25 MG tablet Take 1 tablet (25 mg total) by mouth at bedtime. 30 tablet 6  . ranolazine (RANEXA) 500 MG 12 hr tablet TAKE 1 TABLET BY MOUTH TWICE DAILY 180 tablet 0  . sertraline (ZOLOFT) 100 MG tablet Take 1 tablet (100 mg total) by mouth 2 (two) times daily. 180 tablet 0   No facility-administered medications prior to visit.    Allergies  Allergen Reactions  . Morphine Other (See Comments)    Hallucinations  . Shellfish-Derived Products Itching  Review of Systems  Constitutional:  Positive for malaise/fatigue. Negative for fever.  HENT:  Negative for congestion.   Eyes:  Negative for blurred vision.  Respiratory:  Negative for shortness of breath.   Cardiovascular:  Negative for chest pain, palpitations and leg swelling.  Gastrointestinal:  Negative for abdominal pain, blood in stool and nausea.  Genitourinary:  Negative for dysuria and frequency.  Musculoskeletal:  Negative for falls.  Skin:  Negative for rash.  Neurological:  Negative for dizziness, loss of consciousness and headaches.  Endo/Heme/Allergies:  Negative for environmental allergies.   Psychiatric/Behavioral:  Positive for depression and memory loss. Negative for hallucinations, substance abuse and suicidal ideas. The patient is nervous/anxious.       Objective:    Physical Exam Vitals reviewed.  Constitutional:      Appearance: Normal appearance. He is not ill-appearing.  HENT:     Head: Normocephalic and atraumatic.     Nose: Nose normal.  Eyes:     Conjunctiva/sclera: Conjunctivae normal.  Cardiovascular:     Rate and Rhythm: Normal rate.     Pulses: Normal pulses.     Heart sounds: Normal heart sounds. No murmur heard. Pulmonary:     Effort: Pulmonary effort is normal.     Breath sounds: Normal breath sounds. No wheezing.  Abdominal:     Palpations: Abdomen is soft. There is no mass.     Tenderness: There is no abdominal tenderness.  Musculoskeletal:     Cervical back: Normal range of motion.     Right lower leg: No edema.     Left lower leg: No edema.  Skin:    General: Skin is warm and dry.  Neurological:     General: No focal deficit present.     Mental Status: He is alert and oriented to person, place, and time.  Psychiatric:        Mood and Affect: Mood normal.   BP 124/68 (BP Location: Left Arm, Patient Position: Sitting, Cuff Size: Normal)   Pulse 77   Temp 98 F (36.7 C) (Oral)   Resp 16   Ht 5\' 8"  (1.727 m)   Wt 167 lb (75.8 kg)   SpO2 97%   BMI 25.39 kg/m  Wt Readings from Last 3 Encounters:  09/08/23 167 lb (75.8 kg)  09/01/23 166 lb (75.3 kg)  07/12/23 165 lb (74.8 kg)    Diabetic Foot Exam - Simple   No data filed    Lab Results  Component Value Date   WBC 9.0 07/12/2023   HGB 14.4 07/12/2023   HCT 42.6 07/12/2023   PLT 176.0 07/12/2023   GLUCOSE 396 (H) 09/01/2023   CHOL 95 11/12/2022   TRIG 81.0 11/12/2022   HDL 41.00 11/12/2022   LDLDIRECT 77.0 08/20/2019   LDLCALC 37 11/12/2022   ALT 36 09/01/2023   AST 28 09/01/2023   NA 133 (L) 09/01/2023   K 4.2 09/01/2023   CL 96 09/01/2023   CREATININE 1.39  09/01/2023   BUN 22 09/01/2023   CO2 27 09/01/2023   TSH 10.12 (H) 09/01/2023   PSA 3.14 08/20/2019   INR 1.2 06/16/2022   HGBA1C 10.3 (H) 09/01/2023   MICROALBUR <0.7 05/14/2019    Lab Results  Component Value Date   TSH 10.12 (H) 09/01/2023   Lab Results  Component Value Date   WBC 9.0 07/12/2023   HGB 14.4 07/12/2023   HCT 42.6 07/12/2023   MCV 90.3 07/12/2023   PLT 176.0 07/12/2023   Lab  Results  Component Value Date   NA 133 (L) 09/01/2023   K 4.2 09/01/2023   CO2 27 09/01/2023   GLUCOSE 396 (H) 09/01/2023   BUN 22 09/01/2023   CREATININE 1.39 09/01/2023   BILITOT 0.8 09/01/2023   ALKPHOS 102 09/01/2023   AST 28 09/01/2023   ALT 36 09/01/2023   PROT 6.6 09/01/2023   ALBUMIN 4.2 09/01/2023   CALCIUM 9.2 09/01/2023   ANIONGAP 8 01/05/2023   GFR 49.78 (L) 09/01/2023   Lab Results  Component Value Date   CHOL 95 11/12/2022   Lab Results  Component Value Date   HDL 41.00 11/12/2022   Lab Results  Component Value Date   LDLCALC 37 11/12/2022   Lab Results  Component Value Date   TRIG 81.0 11/12/2022   Lab Results  Component Value Date   CHOLHDL 2 11/12/2022   Lab Results  Component Value Date   HGBA1C 10.3 (H) 09/01/2023       Assessment & Plan:  CKD (chronic kidney disease), stage IV (HCC) Assessment & Plan: Hydrate and monitor   Dementia without behavioral disturbance, psychotic disturbance, mood disturbance, or anxiety, unspecified dementia severity, unspecified dementia type (HCC) Assessment & Plan: Continues to live at home with family support   Type 2 diabetes mellitus with other circulatory complication, with long-term current use of insulin (HCC) Assessment & Plan: hgba1c acceptable, minimize simple carbs. Increase exercise as tolerated. Continue current meds    Essential hypertension Assessment & Plan: Well controlled, no changes to meds. Encouraged heart healthy diet such as the DASH diet and exercise as tolerated.     Hyperlipidemia LDL goal <70 Assessment & Plan: Encourage heart healthy diet such as MIND or DASH diet, increase exercise, avoid trans fats, simple carbohydrates and processed foods, consider a krill or fish or flaxseed oil cap daily. Tolerating Atorvastatin   PAF (paroxysmal atrial fibrillation) (HCC) Assessment & Plan: No recent palpitations   Vitamin D deficiency Assessment & Plan: Supplement and monitor   Need for influenza vaccination -     Flu Vaccine Trivalent High Dose (Fluad)  Other orders -     Sertraline HCl; Take 1 tablet (100 mg total) by mouth daily. -     Venlafaxine HCl ER; Take 1 capsule (37.5 mg total) by mouth daily with breakfast.  Dispense: 7 capsule; Refill: 1 -     Venlafaxine HCl ER; Take 1 capsule (75 mg total) by mouth daily with breakfast.  Dispense: 90 capsule; Refill: 1    Assessment and Plan    Mixed Dementia (Alzheimer's and Traumatic Brain Injury) Patient reports significant memory loss. Discussed the complex interplay of Alzheimer's and TBI contributing to his symptoms. -Continue current management.  Diabetes Mellitus Patient reports occasional hypoglycemic episodes and dizziness, but overall blood sugar control remains suboptimal with elevated A1c. -Refer to pharmacist specializing in diabetes for closer monitoring and medication titration. -Encourage regular protein intake every four hours to help stabilize blood sugar levels.  Hypothyroidism TSH levels remain high despite current levothyroxine therapy, indicating underactive thyroid. -Increase levothyroxine dose to daily. -Recheck TSH levels in three months.  Agitation and Anxiety Patient reports increased agitation, likely related to dementia and chronic stress. Currently on maximum dose of sertraline (Zoloft). -Reduce sertraline to 100mg  daily. -Add venlafaxine (Effexor) 37.5mg  at night for one week, then increase to 75mg  at night.  General Health Maintenance -Administer  influenza vaccine today. -Discussed the importance of COVID-19 booster vaccination. -Schedule follow-up:virtual visit in 8 weeks, in-person visit in  4 months.         Danise Edge, MD

## 2023-09-13 ENCOUNTER — Other Ambulatory Visit: Payer: Medicare Other | Admitting: Pharmacist

## 2023-09-13 NOTE — Progress Notes (Signed)
09/13/2023 Name: [name] [name] MRN: [id number] DOB: Feb 08, 1946  Chief Complaint  Patient presents with   Medication Management   Diabetes    [name] [name] is a 77 y.o. year old male who presented for a telephone visit.   They were referred to the pharmacist by their PCP for assistance in managing diabetes and complex medication management.    Subjective:  Care Team: Primary Care Provider: Bradd Canary, MD ; Next Scheduled Visit: 10/10/2023 Neurologist: Dr Teresa Coombs; Next Scheduled Visit: 02/02/2024  Medication Access/Adherence  Current Pharmacy:  [facility] [place] [facility] [address], [address] [name] [name] [address] Phone: [phone] Fax: [phone]   Patient reports affordability concerns with their medications: No  Patient reports access/transportation concerns to their pharmacy: No  - patient currently is not driving but his daughters help with appointments and getting medications for him from pharmacy.  Patient reports adherence concerns with their medications:  No  - daughters help with filling medication containers for patient.    Diabetes:  Current medications:   Farxiga 10mg  daily  Toujeo 60 units once each morning;  Humalog / Insulin lispro -  Inject 12 Units into the skin 3 (three) times daily. Plus correction blood glucose- 130 / 25   Per patient he has a chart that he uses for correction:  Blood glucose < 155 - no extra correct dose Blood glucose 156 to 180 = 1 additional unit Blood glucose 181 to 205 = 2 additional units Blood glucose 206 to 230 = 3 additional units Blood glucose 231 to 255 = 4 additional units Blood glucose 256 to 285 = 5 additional units Blood glucose 286 to 305 = 6 additional units Blood glucose 306 to 330 = 7 additional units Blood glucose 3310 to 355 = 9 additional units Blood glucose 356 to 380 = 10 additional units.   Previous medications: Trulicity and Ozempic - recommended but  not started due to cost.    Using [place] 3 Continuous Glucose Monitor  Reviewed his profile in [facility] but the last info available was from 08/23/2023. See report below.   Observed trends: had 3 lows, which all occurred between midnight and 9am. Blood glucose rises significantly during the day.           Hyperlipidemia/ASCVD Risk Reduction / CAD / Afib   Current lipid lowering medications: atorvastatin 80mg  daily   Previous medications: Fenofibrate - stopped 05/2023 - patient had not taken in over a year.     Antiplatelet regimen: aspirin 81mg  daily  Also taking Eliquis 5mg  twice a day for stroke prevention related to afib.    Other cardiovascular medications: amiodarone 200mg  daily for rate control isosorbide ER 120mg  daily for chest pain / CAD Ranolazine 500mg  twice a day for chest pain / CAD - last refill was 06/06/2023 and patient checked his medications, did not have ranolazine at home.    Medication Management:   Current adherence strategy: daughters helping with medications. He has clock with loud alarm to remind him to take medications.   Patient reports not optimal adherence to medications  There seems to be a little confusion about donepezil. 06/20/2023 - Dr Teresa Coombs, neurologist stopped donepezil due to diarrhea but on   06/22/23, cardiology notes reported "Updated donepezil to 10mg  daily". It also appears that donepezil was refilled with most recent medications on 09/11/2023.  Patient reports the following barriers to adherence:  Multiple comorbidities Complex medication regimen Lack of care coordination  between multiple providers Independent pausing, stopping or controlling of the medication.. Memory changes  Objective:  Lab Results  Component Value Date   HGBA1C 10.3 (H) 09/01/2023    Lab Results  Component Value Date   CREATININE 1.39 09/01/2023   BUN 22 09/01/2023   NA 133 (L) 09/01/2023   K 4.2 09/01/2023   CL 96 09/01/2023   CO2 27  09/01/2023    Lab Results  Component Value Date   CHOL 95 11/12/2022   HDL 41.00 11/12/2022   LDLCALC 37 11/12/2022   LDLDIRECT 77.0 08/20/2019   TRIG 81.0 11/12/2022   CHOLHDL 2 11/12/2022    Medications Reviewed Today   Medications were not reviewed in this encounter       Assessment/Plan:   Diabetes: - Currently uncontrolled - need more updated Continuous Glucose Monitor information to determine how to potentially adjust medications fro better blood glucose control. Attempted to reconnect on [facility] with patient over the phone but was unsuccessful. Will meet with patient face to face in 3 days. - Recommend to continue current insulin therapy. Consider trial of GLP1 with samples if we have any in the office.  - Recommend to check glucose prior to meals and at bedtime.  - Reviewed hypoglycemia treatment    Hyperlipidemia/ASCVD Risk Reduction: - Currently controlled.  - Recommend to restart ranolazine - will discuss this with patient's daughter to make sure there is no undocumented reason for stopping ranolazine.   Medication Management: - Current strategy insufficient to maintain appropriate adherence to prescribed medication regimen - Continue use of weekly pill box to organize medications - Recommended patient stop donepezil. Will verify with his daughter that he was told to hold by Dr Teresa Coombs. - Once status of ranolazine and donepezil are verified will communication with pharmacy. Could consider adherence packaging.   Follow Up Plan: 3 days for face to face visit  [name] [name], PharmD Clinical Pharmacist [facility]

## 2023-09-15 ENCOUNTER — Encounter: Payer: Self-pay | Admitting: Podiatry

## 2023-09-15 ENCOUNTER — Ambulatory Visit: Payer: Medicare Other | Admitting: Podiatry

## 2023-09-15 DIAGNOSIS — E1142 Type 2 diabetes mellitus with diabetic polyneuropathy: Secondary | ICD-10-CM

## 2023-09-15 DIAGNOSIS — M2042 Other hammer toe(s) (acquired), left foot: Secondary | ICD-10-CM | POA: Diagnosis not present

## 2023-09-15 DIAGNOSIS — M2041 Other hammer toe(s) (acquired), right foot: Secondary | ICD-10-CM | POA: Diagnosis not present

## 2023-09-15 DIAGNOSIS — Z794 Long term (current) use of insulin: Secondary | ICD-10-CM

## 2023-09-15 DIAGNOSIS — E119 Type 2 diabetes mellitus without complications: Secondary | ICD-10-CM | POA: Diagnosis not present

## 2023-09-15 NOTE — Progress Notes (Signed)
Subjective:  Patient ID: [name] [name], male    DOB: 16-Nov-1946,  MRN: [id number]  Chief Complaint  Patient presents with   Diabetes    Routine foot care. New patient. Complaining of neuropathy. Last A1c 9. Frequent falls, last fall about 1.5 weeks ago    Discussed the use of AI scribe software for clinical note transcription with the patient, who gave verbal consent to proceed.  History of Present Illness   The patient, a diabetic, presents with worsening numbness in his feet. He describes the sensation as constant and worsening over time. He also reports a tingling sensation when his feet are touched. The patient has a history of poor glycemic control with his last hemoglobin A1c at 9%, although it is typically around 7-8%. He admits to occasional non-compliance with his diabetic diet. The patient also reports balance issues and has a history of falls. 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          Objective:    Physical Exam   EXTREMITIES: Pulses palpable plus two, dorsalis pedis and posterior tibial bilaterally. Feet warm and well perfused. Presence of semi-reducible hammer toe contractures and mild hallux valgus deformity. NEUROLOGICAL: Loss of protective sensation to monofilament at the level of the midfoot and ankle bilaterally.       No images are attached to the encounter.    Results   LABS Hemoglobin A1c: 9%      Assessment:   1. Encounter for diabetic foot exam (HCC)   2. Diabetic peripheral neuropathy associated with type 2 diabetes mellitus (HCC)   3. Type 2 diabetes mellitus with diabetic polyneuropathy, with long-term current use of insulin (HCC)   4. Hammertoe of left foot   5. Hammertoe of right foot      Plan:  Patient was evaluated and treated and all questions answered.  Assessment and Plan    Diabetic Peripheral Neuopathy   He experiences numbness  and tingling in his feet, which has worsened over time without pain or burning sensations. A significant loss of protective sensation to the level of the midfoot and ankle bilaterally was noted. We discussed how elevated A1c impacts neuropathy progression and stressed the importance of daily foot checks to prevent injury and infection. We encouraged him to improve glycemic control to potentially slow the progression of neuropathy. We advised against neuropathy medications due to potential side effects and limited benefit for numbness. Daily foot checks and avoidance of going barefoot were recommended to prevent injury and infection. We scheduled a fitting for diabetic shoes and insoles to protect his feet and prevent ulceration.  Diabetes Mellitus   His last A1c was 9, typically ranging between 7-8. We discussed the impact of elevated A1c on neuropathy and other diabetes complications. We encouraged him to improve glycemic control, aiming for an A1c under 8%.  We will follow up in 1 year for a check-up and exam, or sooner if any concerns arise regarding foot health.          Return in about 1 year (around 09/14/2024) for diabetic foot examination.

## 2023-09-16 ENCOUNTER — Encounter: Payer: Self-pay | Admitting: Pharmacist

## 2023-09-16 ENCOUNTER — Other Ambulatory Visit (INDEPENDENT_AMBULATORY_CARE_PROVIDER_SITE_OTHER): Payer: Medicare Other | Admitting: Pharmacist

## 2023-09-16 VITALS — BP 122/62 | HR 65

## 2023-09-16 DIAGNOSIS — Z794 Long term (current) use of insulin: Secondary | ICD-10-CM

## 2023-09-16 DIAGNOSIS — N1831 Chronic kidney disease, stage 3a: Secondary | ICD-10-CM

## 2023-09-16 DIAGNOSIS — I25111 Atherosclerotic heart disease of native coronary artery with angina pectoris with documented spasm: Secondary | ICD-10-CM

## 2023-09-16 DIAGNOSIS — E1122 Type 2 diabetes mellitus with diabetic chronic kidney disease: Secondary | ICD-10-CM

## 2023-09-16 NOTE — Progress Notes (Signed)
09/16/2023 Name: [name] [name] MRN: [id number] DOB: [birth year]  Chief Complaint  Patient presents with   Medication Management   Diabetes    [name] [name] is a 77 y.o. year old male. A home visit was completed today.    They were referred to the pharmacist by their PCP for assistance in managing diabetes and complex medication management.    Subjective:  Care Team: Primary Care Provider: Bradd Canary, MD ; Next Scheduled Visit: 10/10/2023 Neurologist: Dr Teresa Coombs; Next Scheduled Visit: 02/02/2024  Medication Access/Adherence  Current Pharmacy:  [facility] [place] [facility] [address], [address] [place] [address] Phone: [phone] Fax: [phone]   Patient reports affordability concerns with their medications: No  Patient reports access/transportation concerns to their pharmacy: No  - patient currently is not driving but his daughters help with appointments and getting medications for him from pharmacy.  Patient reports adherence concerns with their medications:  No  - daughters help with filling medication containers for patient. Lafonda Mosses comes every Wednesday and fills med tray.   Diabetes:  Current medications:   Farxiga 10mg  daily  Toujeo 60 units once each morning;  Humalog / Insulin lispro -  Inject 12 Units into the skin 3 (three) times daily. Plus correction blood glucose- 130 / 25   Per patient he has a chart that he uses for correction:  Blood glucose < 155 - no extra correct dose Blood glucose 156 to 180 = 1 additional unit Blood glucose 181 to 205 = 2 additional units Blood glucose 206 to 230 = 3 additional units Blood glucose 231 to 255 = 4 additional units Blood glucose 256 to 285 = 5 additional units Blood glucose 286 to 305 = 6 additional units Blood glucose 306 to 330 = 7 additional units Blood glucose 3310 to 355 = 9 additional units Blood glucose 356 to 380 = 10 additional units.   Previous  medications: Trulicity and Ozempic - recommended but not started due to cost.   Using [place] 3 Continuous Glucose Monitor  Reviewed his profile in [facility] but the last info available was from 08/23/2023. I observed today that patient has not been able to log into his [place] app. I am not certain how long he has not been able to use the [place] app on his phone which is that only way that he would be able to access the blood glucose readings. This leads me to believe that he has not been adjusting his mealtime insulin per above scale.   Once I assisted patient in resetting his [place] login. I reviewed his records:   Low blood glucose event report:  Has had 3 low blood glucose events in the last 30 days - 2 occurred between 6am and 9am and he had one low between 9pm and midnight.   Current blood glucose with Continuous Glucose Monitor was Hi Average blood glucose for the last 14 days = 270 mg/dL  Observed blood glucose trends on [place] phone app:  had 3 lows, which all occurred between midnight and 9am. Blood glucose rises significantly during the day.   Patient reports polyuria and polydipsia.    Hyperlipidemia/ASCVD Risk Reduction / CAD / Afib   Current lipid lowering medications: atorvastatin 80mg  daily   Previous medications: Fenofibrate - stopped 05/2023 - patient had not taken in over a year.     Antiplatelet regimen: aspirin 81mg  daily  Also taking Eliquis 5mg  twice a day  for stroke prevention related to afib.    Other cardiovascular medications: amiodarone 200mg  daily for rate control isosorbide ER 120mg  daily for chest pain / CAD Ranolazine 500mg  twice a day for chest pain / CAD - last refill was 06/06/2023. Patient did have a bottle of ranolazine today but there was no prescription label on the bottle. Per patient he received the medication in a baggie with 3 bottles of medication but he lost the baggie which had the medication's label with instructions.     Medication  Management:   Current adherence strategy: daughters helping with medications. He has clock with loud alarm to remind him to take medications.   Patient reports adherence to medications is not optimal but has improved with his daughters assistance.   Patient reports the following barriers to adherence:  Multiple comorbidities Complex medication regimen Lack of care coordination between multiple providers Independent pausing, stopping or controlling of the medication.. Memory changes  Objective:  Vitals:   09/16/23 1346  BP: 122/62  Pulse: 65    Lab Results  Component Value Date   HGBA1C 10.3 (H) 09/01/2023    Lab Results  Component Value Date   CREATININE 1.39 09/01/2023   BUN 22 09/01/2023   NA 133 (L) 09/01/2023   K 4.2 09/01/2023   CL 96 09/01/2023   CO2 27 09/01/2023    Lab Results  Component Value Date   CHOL 95 11/12/2022   HDL 41.00 11/12/2022   LDLCALC 37 11/12/2022   LDLDIRECT 77.0 08/20/2019   TRIG 81.0 11/12/2022   CHOLHDL 2 11/12/2022    Medications Reviewed Today   Medications were not reviewed in this encounter       Assessment/Plan:   Diabetes: Currently uncontrolled - Patient did not seem to be using Humalog with his sliding scale due to not having assess to [place] App on his phone.  - Assisted patient with resetting his password to the [place] app on his phone. Wrote down his log in information for reference in the future if needed.  - Education provided about how to use the information provided by the [place] Continuous Glucose Monitor app. Discussed using trend arrows to help him know where blood glucose is heading and how to interpret this information.  - Set reminders on the [place] app to remind him to check blood glucose 3 times a day around his mealtimes.  - Also showed patient how to enter notes into the app - like when he takes insulin and how much.  - Recommend to continue current insulin therapy. In future consider trial of GLP1 with  samples if we have any in the office and then apply for medication assistance program if GLP1 is beneficial.  - Observed patient injecting correction dose of 10 units of Humalog insulin while I was present. I did not observe and issues with insulin injection technique. Blood glucose had decreased about 25 points during our visit (was 1hr and 15 minutes)  - Reviewed hypoglycemia treatment    Hyperlipidemia/ASCVD Risk Reduction: - Currently controlled.  - Recommend to restart ranolazine - will discuss this with patient's daughter to make sure there is no undocumented reason for stopping ranolazine.   Medication Management: - Current strategy insufficient to maintain appropriate adherence to prescribed medication regimen - Reviewed each medication with patient . Wrote on each bottle why he was taking. Also added directions to the ranolazine bottle.   - Continue use of weekly pill box to organize medications - Could consider adherence packaging. Will  discuss this further with his daughter s).   Follow Up Plan: 5 to 7 days.   Henrene Pastor, PharmD Clinical Pharmacist [facility]

## 2023-09-19 ENCOUNTER — Telehealth: Payer: Self-pay | Admitting: Pharmacist

## 2023-09-19 ENCOUNTER — Other Ambulatory Visit (HOSPITAL_BASED_OUTPATIENT_CLINIC_OR_DEPARTMENT_OTHER): Payer: Self-pay

## 2023-09-19 ENCOUNTER — Other Ambulatory Visit: Payer: Self-pay | Admitting: Family Medicine

## 2023-09-19 MED ORDER — RANOLAZINE ER 500 MG PO TB12
500.0000 mg | ORAL_TABLET | Freq: Two times a day (BID) | ORAL | 1 refills | Status: DC
  Filled 2023-09-19 – 2023-10-27 (×2): qty 180, 90d supply, fill #0
  Filled 2023-12-28 – 2024-01-03 (×2): qty 180, 90d supply, fill #1
  Filled 2024-01-03 – 2024-01-05 (×2): qty 60, 30d supply, fill #1
  Filled 2024-01-18 – 2024-01-31 (×2): qty 60, 30d supply, fill #2
  Filled 2024-02-09 – 2024-02-27 (×2): qty 60, 30d supply, fill #3

## 2023-09-19 NOTE — Telephone Encounter (Signed)
Patient's daughter Florentina Addison called back. Discussed with Florentina Addison and Lafonda Mosses about recent medication changes.  She will stop putting donepezil in his med box and restart ranolazine twice a day.   Notified pharmacy about changes as well and requested refill for ranolazine.

## 2023-09-19 NOTE — Telephone Encounter (Signed)
When I spoke to patient on Friday he asked if I would contact [name] [name], his daughter regarding his medications. There are 3 contacts listed on his chart at daughters - [name] [name], [name] [name] and [name] [name].  I reviewed that last HIPAA on file form 2020 and approved contacts for medical information were [name] [name] and [name] [name] [name] [name] is listed as his wife).  I tried to call [name] [name] and [name] [name] but had to leave a message on VM with my CB# [phone]

## 2023-09-20 ENCOUNTER — Other Ambulatory Visit (HOSPITAL_BASED_OUTPATIENT_CLINIC_OR_DEPARTMENT_OTHER): Payer: Self-pay

## 2023-09-21 ENCOUNTER — Ambulatory Visit: Payer: Medicare Other | Admitting: Neurology

## 2023-09-22 ENCOUNTER — Other Ambulatory Visit: Payer: Self-pay | Admitting: Pharmacist

## 2023-09-30 ENCOUNTER — Other Ambulatory Visit (HOSPITAL_BASED_OUTPATIENT_CLINIC_OR_DEPARTMENT_OTHER): Payer: Self-pay

## 2023-09-30 ENCOUNTER — Ambulatory Visit: Payer: Medicare Other

## 2023-09-30 DIAGNOSIS — E1142 Type 2 diabetes mellitus with diabetic polyneuropathy: Secondary | ICD-10-CM

## 2023-09-30 NOTE — Progress Notes (Signed)
Patient presents to the office today for diabetic shoe and insole measuring.  Patient was measured with brannock device to determine size and width for 1 pair of extra depth shoes and foam casted for 3 pair of insoles.   Documentation of medical necessity will be sent to patient's treating diabetic doctor to verify and sign.   Patient's diabetic provider: Romero Belling MD  Shoes and insoles will be ordered at that time and patient will be notified for an appointment for fitting when they arrive.   Shoe size (per patient): 9 Brannock measurement: 9.5 Patient shoe selection- Shoe choice:   [id number] / [id number] Shoe size ordered: 9.77M [employer] signed  Addison Bailey Cped, CFo, CFm

## 2023-10-03 ENCOUNTER — Other Ambulatory Visit (HOSPITAL_BASED_OUTPATIENT_CLINIC_OR_DEPARTMENT_OTHER): Payer: Self-pay

## 2023-10-03 ENCOUNTER — Other Ambulatory Visit: Payer: Self-pay | Admitting: Neurology

## 2023-10-03 ENCOUNTER — Other Ambulatory Visit: Payer: Self-pay | Admitting: Family Medicine

## 2023-10-03 ENCOUNTER — Other Ambulatory Visit: Payer: Self-pay

## 2023-10-03 MED ORDER — QUETIAPINE FUMARATE 25 MG PO TABS
25.0000 mg | ORAL_TABLET | Freq: Every day | ORAL | 6 refills | Status: DC
  Filled 2023-10-03: qty 30, 30d supply, fill #0
  Filled 2023-10-31: qty 30, 30d supply, fill #1
  Filled 2023-12-05: qty 30, 30d supply, fill #2
  Filled 2023-12-28 – 2024-01-05 (×4): qty 30, 30d supply, fill #3
  Filled 2024-01-18 – 2024-01-31 (×2): qty 30, 30d supply, fill #4
  Filled 2024-02-09 – 2024-02-27 (×2): qty 30, 30d supply, fill #5
  Filled 2024-03-21 – 2024-03-22 (×2): qty 30, 30d supply, fill #6

## 2023-10-03 MED ORDER — ISOSORBIDE MONONITRATE ER 120 MG PO TB24
120.0000 mg | ORAL_TABLET | Freq: Every day | ORAL | 0 refills | Status: DC
  Filled 2023-10-03: qty 90, 90d supply, fill #0

## 2023-10-03 NOTE — Telephone Encounter (Signed)
Rx refilled.

## 2023-10-05 ENCOUNTER — Other Ambulatory Visit (HOSPITAL_BASED_OUTPATIENT_CLINIC_OR_DEPARTMENT_OTHER): Payer: Self-pay

## 2023-10-07 ENCOUNTER — Other Ambulatory Visit (HOSPITAL_BASED_OUTPATIENT_CLINIC_OR_DEPARTMENT_OTHER): Payer: Self-pay

## 2023-10-10 ENCOUNTER — Other Ambulatory Visit (HOSPITAL_BASED_OUTPATIENT_CLINIC_OR_DEPARTMENT_OTHER): Payer: Self-pay

## 2023-10-10 ENCOUNTER — Other Ambulatory Visit: Payer: Self-pay | Admitting: Family Medicine

## 2023-10-10 ENCOUNTER — Ambulatory Visit: Payer: Medicare Other | Admitting: Family Medicine

## 2023-10-10 ENCOUNTER — Ambulatory Visit: Payer: Medicare Other | Admitting: Physician Assistant

## 2023-10-10 MED ORDER — VENLAFAXINE HCL ER 37.5 MG PO CP24
37.5000 mg | ORAL_CAPSULE | Freq: Every day | ORAL | 1 refills | Status: DC
  Filled 2023-10-10: qty 7, 7d supply, fill #0

## 2023-10-11 ENCOUNTER — Ambulatory Visit: Payer: Medicare Other | Admitting: Family Medicine

## 2023-10-14 ENCOUNTER — Inpatient Hospital Stay (HOSPITAL_COMMUNITY)
Admission: EM | Admit: 2023-10-14 | Discharge: 2023-10-19 | DRG: 183 | Disposition: A | Discharge status: Home Health | Payer: Medicare Other | Attending: Surgery | Admitting: Surgery

## 2023-10-14 ENCOUNTER — Emergency Department (HOSPITAL_COMMUNITY): Payer: Medicare Other

## 2023-10-14 ENCOUNTER — Other Ambulatory Visit: Payer: Self-pay

## 2023-10-14 ENCOUNTER — Encounter (HOSPITAL_COMMUNITY): Payer: Self-pay | Admitting: *Deleted

## 2023-10-14 DIAGNOSIS — T1490XA Injury, unspecified, initial encounter: Secondary | ICD-10-CM | POA: Diagnosis present

## 2023-10-14 DIAGNOSIS — Y92009 Unspecified place in unspecified non-institutional (private) residence as the place of occurrence of the external cause: Secondary | ICD-10-CM

## 2023-10-14 DIAGNOSIS — Z91013 Allergy to seafood: Secondary | ICD-10-CM

## 2023-10-14 DIAGNOSIS — K219 Gastro-esophageal reflux disease without esophagitis: Secondary | ICD-10-CM | POA: Diagnosis not present

## 2023-10-14 DIAGNOSIS — R296 Repeated falls: Secondary | ICD-10-CM | POA: Diagnosis present

## 2023-10-14 DIAGNOSIS — F419 Anxiety disorder, unspecified: Secondary | ICD-10-CM | POA: Diagnosis present

## 2023-10-14 DIAGNOSIS — Z87442 Personal history of urinary calculi: Secondary | ICD-10-CM

## 2023-10-14 DIAGNOSIS — M542 Cervicalgia: Secondary | ICD-10-CM | POA: Diagnosis not present

## 2023-10-14 DIAGNOSIS — J948 Other specified pleural conditions: Secondary | ICD-10-CM | POA: Diagnosis not present

## 2023-10-14 DIAGNOSIS — E1122 Type 2 diabetes mellitus with diabetic chronic kidney disease: Secondary | ICD-10-CM | POA: Diagnosis present

## 2023-10-14 DIAGNOSIS — I4719 Other supraventricular tachycardia: Secondary | ICD-10-CM | POA: Diagnosis not present

## 2023-10-14 DIAGNOSIS — S3681XA Injury of peritoneum, initial encounter: Secondary | ICD-10-CM | POA: Diagnosis not present

## 2023-10-14 DIAGNOSIS — S0990XA Unspecified injury of head, initial encounter: Secondary | ICD-10-CM | POA: Diagnosis not present

## 2023-10-14 DIAGNOSIS — R161 Splenomegaly, not elsewhere classified: Secondary | ICD-10-CM | POA: Diagnosis not present

## 2023-10-14 DIAGNOSIS — I2510 Atherosclerotic heart disease of native coronary artery without angina pectoris: Secondary | ICD-10-CM | POA: Diagnosis not present

## 2023-10-14 DIAGNOSIS — D509 Iron deficiency anemia, unspecified: Secondary | ICD-10-CM | POA: Diagnosis not present

## 2023-10-14 DIAGNOSIS — N183 Chronic kidney disease, stage 3 unspecified: Secondary | ICD-10-CM | POA: Diagnosis present

## 2023-10-14 DIAGNOSIS — I951 Orthostatic hypotension: Secondary | ICD-10-CM | POA: Diagnosis not present

## 2023-10-14 DIAGNOSIS — Z794 Long term (current) use of insulin: Secondary | ICD-10-CM | POA: Diagnosis not present

## 2023-10-14 DIAGNOSIS — G2581 Restless legs syndrome: Secondary | ICD-10-CM | POA: Diagnosis present

## 2023-10-14 DIAGNOSIS — Z860101 Personal history of adenomatous and serrated colon polyps: Secondary | ICD-10-CM

## 2023-10-14 DIAGNOSIS — E1142 Type 2 diabetes mellitus with diabetic polyneuropathy: Secondary | ICD-10-CM | POA: Diagnosis present

## 2023-10-14 DIAGNOSIS — Z91148 Patient's other noncompliance with medication regimen for other reason: Secondary | ICD-10-CM

## 2023-10-14 DIAGNOSIS — W19XXXA Unspecified fall, initial encounter: Principal | ICD-10-CM

## 2023-10-14 DIAGNOSIS — S272XXA Traumatic hemopneumothorax, initial encounter: Secondary | ICD-10-CM | POA: Diagnosis not present

## 2023-10-14 DIAGNOSIS — I4510 Unspecified right bundle-branch block: Secondary | ICD-10-CM | POA: Diagnosis present

## 2023-10-14 DIAGNOSIS — J939 Pneumothorax, unspecified: Secondary | ICD-10-CM | POA: Diagnosis not present

## 2023-10-14 DIAGNOSIS — Z951 Presence of aortocoronary bypass graft: Secondary | ICD-10-CM | POA: Diagnosis not present

## 2023-10-14 DIAGNOSIS — I129 Hypertensive chronic kidney disease with stage 1 through stage 4 chronic kidney disease, or unspecified chronic kidney disease: Secondary | ICD-10-CM | POA: Diagnosis present

## 2023-10-14 DIAGNOSIS — E785 Hyperlipidemia, unspecified: Secondary | ICD-10-CM | POA: Diagnosis present

## 2023-10-14 DIAGNOSIS — Z885 Allergy status to narcotic agent status: Secondary | ICD-10-CM

## 2023-10-14 DIAGNOSIS — J942 Hemothorax: Secondary | ICD-10-CM

## 2023-10-14 DIAGNOSIS — G4733 Obstructive sleep apnea (adult) (pediatric): Secondary | ICD-10-CM | POA: Diagnosis present

## 2023-10-14 DIAGNOSIS — Z7982 Long term (current) use of aspirin: Secondary | ICD-10-CM

## 2023-10-14 DIAGNOSIS — Z955 Presence of coronary angioplasty implant and graft: Secondary | ICD-10-CM

## 2023-10-14 DIAGNOSIS — S2242XA Multiple fractures of ribs, left side, initial encounter for closed fracture: Principal | ICD-10-CM | POA: Diagnosis present

## 2023-10-14 DIAGNOSIS — Z79899 Other long term (current) drug therapy: Secondary | ICD-10-CM

## 2023-10-14 DIAGNOSIS — M549 Dorsalgia, unspecified: Secondary | ICD-10-CM | POA: Diagnosis present

## 2023-10-14 DIAGNOSIS — Z8711 Personal history of peptic ulcer disease: Secondary | ICD-10-CM

## 2023-10-14 DIAGNOSIS — S199XXA Unspecified injury of neck, initial encounter: Secondary | ICD-10-CM | POA: Diagnosis not present

## 2023-10-14 DIAGNOSIS — R0789 Other chest pain: Secondary | ICD-10-CM | POA: Diagnosis not present

## 2023-10-14 DIAGNOSIS — Z91199 Patient's noncompliance with other medical treatment and regimen due to unspecified reason: Secondary | ICD-10-CM

## 2023-10-14 DIAGNOSIS — E1165 Type 2 diabetes mellitus with hyperglycemia: Secondary | ICD-10-CM | POA: Diagnosis not present

## 2023-10-14 DIAGNOSIS — S3991XA Unspecified injury of abdomen, initial encounter: Secondary | ICD-10-CM | POA: Diagnosis not present

## 2023-10-14 DIAGNOSIS — S2239XA Fracture of one rib, unspecified side, initial encounter for closed fracture: Secondary | ICD-10-CM | POA: Diagnosis not present

## 2023-10-14 DIAGNOSIS — E119 Type 2 diabetes mellitus without complications: Secondary | ICD-10-CM | POA: Diagnosis not present

## 2023-10-14 DIAGNOSIS — Z79890 Hormone replacement therapy (postmenopausal): Secondary | ICD-10-CM

## 2023-10-14 DIAGNOSIS — Z96651 Presence of right artificial knee joint: Secondary | ICD-10-CM | POA: Diagnosis not present

## 2023-10-14 DIAGNOSIS — Z87820 Personal history of traumatic brain injury: Secondary | ICD-10-CM

## 2023-10-14 DIAGNOSIS — I480 Paroxysmal atrial fibrillation: Secondary | ICD-10-CM | POA: Diagnosis not present

## 2023-10-14 DIAGNOSIS — Z043 Encounter for examination and observation following other accident: Secondary | ICD-10-CM | POA: Diagnosis not present

## 2023-10-14 DIAGNOSIS — E034 Atrophy of thyroid (acquired): Secondary | ICD-10-CM | POA: Diagnosis present

## 2023-10-14 DIAGNOSIS — Z8249 Family history of ischemic heart disease and other diseases of the circulatory system: Secondary | ICD-10-CM

## 2023-10-14 DIAGNOSIS — Z820 Family history of epilepsy and other diseases of the nervous system: Secondary | ICD-10-CM

## 2023-10-14 DIAGNOSIS — Z7901 Long term (current) use of anticoagulants: Secondary | ICD-10-CM

## 2023-10-14 DIAGNOSIS — F32A Depression, unspecified: Secondary | ICD-10-CM | POA: Diagnosis present

## 2023-10-14 DIAGNOSIS — Z800 Family history of malignant neoplasm of digestive organs: Secondary | ICD-10-CM

## 2023-10-14 DIAGNOSIS — G40909 Epilepsy, unspecified, not intractable, without status epilepticus: Secondary | ICD-10-CM | POA: Diagnosis present

## 2023-10-14 DIAGNOSIS — F0393 Unspecified dementia, unspecified severity, with mood disturbance: Secondary | ICD-10-CM | POA: Diagnosis present

## 2023-10-14 DIAGNOSIS — Z8371 Family history of adenomatous and serrated polyps: Secondary | ICD-10-CM

## 2023-10-14 DIAGNOSIS — Z823 Family history of stroke: Secondary | ICD-10-CM

## 2023-10-14 DIAGNOSIS — W01198A Fall on same level from slipping, tripping and stumbling with subsequent striking against other object, initial encounter: Secondary | ICD-10-CM | POA: Diagnosis present

## 2023-10-14 DIAGNOSIS — Z833 Family history of diabetes mellitus: Secondary | ICD-10-CM

## 2023-10-14 DIAGNOSIS — R519 Headache, unspecified: Secondary | ICD-10-CM | POA: Diagnosis not present

## 2023-10-14 LAB — CBC WITH DIFFERENTIAL/PLATELET
Abs Immature Granulocytes: 0.06 10*3/uL (ref 0.00–0.07)
Basophils Absolute: 0 10*3/uL (ref 0.0–0.1)
Basophils Relative: 0 %
Eosinophils Absolute: 0.2 10*3/uL (ref 0.0–0.5)
Eosinophils Relative: 2 %
HCT: 39.7 % (ref 39.0–52.0)
Hemoglobin: 13.9 g/dL (ref 13.0–17.0)
Immature Granulocytes: 1 %
Lymphocytes Relative: 15 %
Lymphs Abs: 1.4 10*3/uL (ref 0.7–4.0)
MCH: 31.1 pg (ref 26.0–34.0)
MCHC: 35 g/dL (ref 30.0–36.0)
MCV: 88.8 fL (ref 80.0–100.0)
Monocytes Absolute: 0.6 10*3/uL (ref 0.1–1.0)
Monocytes Relative: 7 %
Neutro Abs: 6.9 10*3/uL (ref 1.7–7.7)
Neutrophils Relative %: 75 %
Platelets: 172 10*3/uL (ref 150–400)
RBC: 4.47 MIL/uL (ref 4.22–5.81)
RDW: 13.7 % (ref 11.5–15.5)
WBC: 9.2 10*3/uL (ref 4.0–10.5)
nRBC: 0 % (ref 0.0–0.2)

## 2023-10-14 LAB — COMPREHENSIVE METABOLIC PANEL
ALT: 53 U/L — ABNORMAL HIGH (ref 0–44)
AST: 44 U/L — ABNORMAL HIGH (ref 15–41)
Albumin: 3.8 g/dL (ref 3.5–5.0)
Alkaline Phosphatase: 77 U/L (ref 38–126)
Anion gap: 11 (ref 5–15)
BUN: 19 mg/dL (ref 8–23)
CO2: 23 mmol/L (ref 22–32)
Calcium: 9.3 mg/dL (ref 8.9–10.3)
Chloride: 103 mmol/L (ref 98–111)
Creatinine, Ser: 1.23 mg/dL (ref 0.61–1.24)
GFR, Estimated: >60 mL/min (ref >60)
Glucose, Bld: 166 mg/dL — ABNORMAL HIGH (ref 70–99)
Potassium: 4.1 mmol/L (ref 3.5–5.1)
Sodium: 137 mmol/L (ref 135–145)
Total Bilirubin: 0.7 mg/dL (ref <1.2)
Total Protein: 6.5 g/dL (ref 6.5–8.1)

## 2023-10-14 LAB — I-STAT CHEM 8, ED
BUN: 21 mg/dL (ref 8–23)
Calcium, Ion: 1.11 mmol/L — ABNORMAL LOW (ref 1.15–1.40)
Chloride: 102 mmol/L (ref 98–111)
Creatinine, Ser: 1.3 mg/dL — ABNORMAL HIGH (ref 0.61–1.24)
Glucose, Bld: 159 mg/dL — ABNORMAL HIGH (ref 70–99)
HCT: 39 % (ref 39.0–52.0)
Hemoglobin: 13.3 g/dL (ref 13.0–17.0)
Potassium: 3.9 mmol/L (ref 3.5–5.1)
Sodium: 137 mmol/L (ref 135–145)
TCO2: 23 mmol/L (ref 22–32)

## 2023-10-14 LAB — TROPONIN I (HIGH SENSITIVITY): Troponin I (High Sensitivity): 11 ng/L (ref <18)

## 2023-10-14 MED ORDER — IOHEXOL 350 MG/ML SOLN
75.0000 mL | Freq: Once | INTRAVENOUS | Status: AC | PRN
  Administered 2023-10-14: 75 mL via INTRAVENOUS

## 2023-10-14 MED ORDER — ONDANSETRON HCL 4 MG/2ML IJ SOLN
4.0000 mg | Freq: Once | INTRAMUSCULAR | Status: AC
  Administered 2023-10-14: 4 mg via INTRAVENOUS
  Filled 2023-10-14: qty 2

## 2023-10-14 MED ORDER — FENTANYL CITRATE PF 50 MCG/ML IJ SOSY
50.0000 ug | PREFILLED_SYRINGE | Freq: Once | INTRAMUSCULAR | Status: AC
Start: 2023-10-14 — End: 2023-10-14
  Administered 2023-10-14: 50 ug via INTRAVENOUS
  Filled 2023-10-14: qty 1

## 2023-10-14 MED ORDER — FENTANYL CITRATE PF 50 MCG/ML IJ SOSY
25.0000 ug | PREFILLED_SYRINGE | Freq: Once | INTRAMUSCULAR | Status: AC
Start: 2023-10-14 — End: 2023-10-14
  Administered 2023-10-14: 25 ug via INTRAVENOUS
  Filled 2023-10-14: qty 1

## 2023-10-14 NOTE — ED Provider Notes (Signed)
[place] EMERGENCY DEPARTMENT AT [facility] Provider Note   CSN: [id number] Arrival date & time: 10/14/23  1931     History  Chief Complaint  Patient presents with   Harmon Pier is a 77 y.o. male.  77 year old male presents today for concern of fall on thinners.  Presents as a level 2.  He states he got tripped on his dog's collar causing him to fall on hardwood floor.  Complaining of pain to the left sided chest wall.  Does endorse loss of consciousness.  No other complaints.  The history is provided by the patient. No language interpreter was used.       Home Medications Prior to Admission medications   Medication Sig Start Date End Date Taking? Authorizing Provider  ranolazine (RANEXA) 500 MG 12 hr tablet Take 1 tablet (500 mg total) by mouth 2 (two) times daily. 09/19/23   Bradd Canary, MD  amiodarone (PACERONE) 200 MG tablet Take 1 tablet (200 mg total) by mouth daily. 09/14/22   Corky Crafts, MD  amitriptyline (ELAVIL) 10 MG tablet Take 1-3 tablets (10-30 mg total) by mouth at bedtime. 08/29/23   Bradd Canary, MD  apixaban (ELIQUIS) 5 MG TABS tablet Take 1 tablet (5 mg total) by mouth 2 (two) times daily. 07/27/23   Corky Crafts, MD  aspirin EC 81 MG tablet Take 81 mg by mouth daily. Swallow whole.    [provider]  atorvastatin (LIPITOR) 80 MG tablet Take 1 tablet (80 mg total) by mouth daily. 08/26/23   Bradd Canary, MD  Continuous Glucose Sensor (FREESTYLE LIBRE 3 SENSOR) MISC Place 1 sensor on the skin every 14 days. Use to check glucose continuously 05/13/23   Shamleffer, Konrad Dolores, MD  dapagliflozin propanediol (FARXIGA) 10 MG TABS tablet Take 1 tablet (10 mg total) by mouth daily. 05/13/23   Shamleffer, Konrad Dolores, MD  glucose blood (ACCU-CHEK AVIVA) test strip Use as directed to check blood sugar 4 times daily.  DX E11.9 Patient not taking: Reported on 09/13/2023 02/05/19   Bradd Canary, MD   hydrocortisone 2.5 % cream Apply 1 Application topically 2 (two) times daily.    [provider]  hydrOXYzine (ATARAX) 10 MG tablet Take 1 tablet (10 mg total) by mouth 3 (three) times daily as needed for anxiety or itching. 08/31/23   Bradd Canary, MD  insulin glargine, 2 Unit Dial, (TOUJEO MAX SOLOSTAR) 300 UNIT/ML Solostar Pen Inject 60 Units into the skin daily in the afternoon. 03/03/23   Shamleffer, Konrad Dolores, MD  insulin lispro (HUMALOG KWIKPEN) 100 UNIT/ML KwikPen Inject 10 Units into the skin 3 (three) times daily. Patient taking differently: Inject 12 Units into the skin 3 (three) times daily. Plus correction blood glucose- 130 / 25 03/03/23   Shamleffer, Konrad Dolores, MD  Insulin Pen Needle 31G X 5 MM MISC Use as directed to inject insulin in the morning, at noon, in the evening, and at bedtime. 03/03/23   Shamleffer, Konrad Dolores, MD  isosorbide mononitrate (IMDUR) 120 MG 24 hr tablet Take 1 tablet (120 mg total) by mouth daily. 10/03/23   Bradd Canary, MD  levocetirizine (XYZAL) 5 MG tablet Take 1 tablet (5 mg total) by mouth every evening. 07/27/23   Carmelia Roller, Jilda Roche, DO  levothyroxine (SYNTHROID) 125 MCG tablet Take 1 tablet (125 mcg total) by mouth daily. 09/05/23   Bradd Canary, MD  memantine (NAMENDA) 10 MG tablet Take  1 tablet (10 mg total) by mouth 2 (two) times daily. 01/27/23 01/22/24  Windell Norfolk, MD  nitroGLYCERIN (NITROSTAT) 0.4 MG SL tablet PLACE 1 TABLET UNDER THE TONGUE EVERY 5 MINUTES AS NEEDED FOR CHEST PAIN. 05/18/23   Bradd Canary, MD  pantoprazole (PROTONIX) 40 MG tablet Take 1 tablet (40 mg total) by mouth daily. 05/31/23   Bradd Canary, MD  pramipexole (MIRAPEX) 1 MG tablet Take 1/2 to 1 tablet (0.5-1 mg total) by mouth at bedtime. 08/05/23   Bradd Canary, MD  QUEtiapine (SEROQUEL) 25 MG tablet Take 1 tablet (25 mg total) by mouth at bedtime. 10/03/23 04/30/24  Windell Norfolk, MD  sertraline (ZOLOFT) 100 MG tablet Take 1 tablet  (100 mg total) by mouth daily. 09/08/23   Bradd Canary, MD  venlafaxine XR (EFFEXOR XR) 37.5 MG 24 hr capsule Take 1 capsule (37.5 mg total) by mouth daily with breakfast. 10/10/23   Bradd Canary, MD  venlafaxine XR (EFFEXOR XR) 75 MG 24 hr capsule Take 1 capsule (75 mg total) by mouth daily with breakfast. 09/08/23   Bradd Canary, MD      Allergies    Morphine and Shellfish-derived products    Review of Systems   Review of Systems  Constitutional:  Negative for chills and fever.  Respiratory:  Negative for shortness of breath.   Cardiovascular:  Positive for chest pain (chest wall pain).  Neurological:  Positive for syncope.  All other systems reviewed and are negative.   Physical Exam Updated Vital Signs BP 138/60   Pulse 69   Temp 98 F (36.7 C) (Oral)   Resp (!) 23   Ht 5\' 8"  (1.727 m)   Wt 75.8 kg   SpO2 98%   BMI 25.41 kg/m  Physical Exam Vitals and nursing note reviewed.  Constitutional:      General: He is not in acute distress.    Appearance: Normal appearance. He is not ill-appearing.  HENT:     Head: Normocephalic and atraumatic.     Nose: Nose normal.  Eyes:     General: No scleral icterus.    Extraocular Movements: Extraocular movements intact.     Conjunctiva/sclera: Conjunctivae normal.  Cardiovascular:     Rate and Rhythm: Normal rate and regular rhythm.     Heart sounds: Normal heart sounds.  Pulmonary:     Effort: Pulmonary effort is normal. No respiratory distress.     Breath sounds: Normal breath sounds. No wheezing.  Abdominal:     General: There is no distension.     Palpations: Abdomen is soft.     Tenderness: There is no abdominal tenderness.  Musculoskeletal:        General: Normal range of motion.     Cervical back: Normal range of motion.     Comments: Left sided chest wall pain  Skin:    General: Skin is warm and dry.  Neurological:     General: No focal deficit present.     Mental Status: He is alert. Mental status is at  baseline.     ED Results / Procedures / Treatments   Labs (all labs ordered are listed, but only abnormal results are displayed) Labs Reviewed  COMPREHENSIVE METABOLIC PANEL - Abnormal; Notable for the following components:      Result Value   Glucose, Bld 166 (*)    AST 44 (*)    ALT 53 (*)    All other components within normal limits  I-STAT  CHEM 8, ED - Abnormal; Notable for the following components:   Creatinine, Ser 1.30 (*)    Glucose, Bld 159 (*)    Calcium, Ion 1.11 (*)    All other components within normal limits  CBC WITH DIFFERENTIAL/PLATELET  TROPONIN I (HIGH SENSITIVITY)    EKG EKG Interpretation Date/Time:  Friday October 14 2023 19:54:15 EST Ventricular Rate:  72 PR Interval:  205 QRS Duration:  151 QT Interval:  438 QTC Calculation: 480 R Axis:   160  Text Interpretation: Sinus rhythm Right bundle branch block No significant change since last tracing Confirmed by Elayne Snare ([id number]) on 10/14/2023 8:08:38 PM  Radiology CT Head Wo Contrast  Result Date: 10/14/2023 CLINICAL DATA:  Head trauma, moderate-severe Fall on thinners; Polytrauma, blunt striking his head he is on a bloodd thinner he is c/o lt sided head pain he was tangeled up in his dog collar and lost his balance EXAM: CT HEAD WITHOUT CONTRAST CT CERVICAL SPINE WITHOUT CONTRAST TECHNIQUE: Multidetector CT imaging of the head and cervical spine was performed following the standard protocol without intravenous contrast. Multiplanar CT image reconstructions of the cervical spine were also generated. RADIATION DOSE REDUCTION: This exam was performed according to the departmental dose-optimization program which includes automated exposure control, adjustment of the mA and/or kV according to patient size and/or use of iterative reconstruction technique. COMPARISON:  None Available. FINDINGS: CT HEAD FINDINGS Brain: Cerebral ventricle sizes are concordant with the degree of cerebral volume loss. Patchy and  confluent areas of decreased attenuation are noted throughout the deep and periventricular white matter of the cerebral hemispheres bilaterally, compatible with chronic microvascular ischemic disease. No evidence of large-territorial acute infarction. No parenchymal hemorrhage. No mass lesion. No extra-axial collection. No mass effect or midline shift. No hydrocephalus. Basilar cisterns are patent. Vascular: No hyperdense vessel. Atherosclerotic calcifications are present within the cavernous internal carotid and vertebral arteries. Skull: No acute fracture or focal lesion. Sinuses/Orbits: Left sphenoid sinus mucosal thickening. Other paranasal sinuses and mastoid air cells are clear. Bilateral lens replacement. Otherwise the orbits are unremarkable. Other: None. CT CERVICAL SPINE FINDINGS Alignment: Normal. Skull base and vertebrae: C5-C7 anterior cervical discectomy and fusion. Multilevel moderate degenerative changes spine. No associated severe osseous neural foraminal or central canal stenosis. No acute fracture. No aggressive appearing focal osseous lesion or focal pathologic process. Soft tissues and spinal canal: No prevertebral fluid or swelling. No visible canal hematoma. Upper chest: Trace to small volume left pneumohemothorax. Other: None. IMPRESSION: 1. No acute intracranial abnormality. 2. No acute displaced fracture or traumatic listhesis of the cervical spine. 3. Trace to small volume left apical pneumothorax. These results were called by telephone at the time of interpretation on 10/14/2023 at 9:43 pm to provider Dr. Theresia Lo, who verbally acknowledged these results. Electronically Signed   By: Tish Frederickson [name]   On: 10/14/2023 21:56   CT Cervical Spine Wo Contrast  Result Date: 10/14/2023 CLINICAL DATA:  Head trauma, moderate-severe Fall on thinners; Polytrauma, blunt striking his head he is on a bloodd thinner he is c/o lt sided head pain he was tangeled up in his dog collar and lost his  balance EXAM: CT HEAD WITHOUT CONTRAST CT CERVICAL SPINE WITHOUT CONTRAST TECHNIQUE: Multidetector CT imaging of the head and cervical spine was performed following the standard protocol without intravenous contrast. Multiplanar CT image reconstructions of the cervical spine were also generated. RADIATION DOSE REDUCTION: This exam was performed according to the departmental dose-optimization program which includes automated exposure control, adjustment  of the mA and/or kV according to patient size and/or use of iterative reconstruction technique. COMPARISON:  None Available. FINDINGS: CT HEAD FINDINGS Brain: Cerebral ventricle sizes are concordant with the degree of cerebral volume loss. Patchy and confluent areas of decreased attenuation are noted throughout the deep and periventricular white matter of the cerebral hemispheres bilaterally, compatible with chronic microvascular ischemic disease. No evidence of large-territorial acute infarction. No parenchymal hemorrhage. No mass lesion. No extra-axial collection. No mass effect or midline shift. No hydrocephalus. Basilar cisterns are patent. Vascular: No hyperdense vessel. Atherosclerotic calcifications are present within the cavernous internal carotid and vertebral arteries. Skull: No acute fracture or focal lesion. Sinuses/Orbits: Left sphenoid sinus mucosal thickening. Other paranasal sinuses and mastoid air cells are clear. Bilateral lens replacement. Otherwise the orbits are unremarkable. Other: None. CT CERVICAL SPINE FINDINGS Alignment: Normal. Skull base and vertebrae: C5-C7 anterior cervical discectomy and fusion. Multilevel moderate degenerative changes spine. No associated severe osseous neural foraminal or central canal stenosis. No acute fracture. No aggressive appearing focal osseous lesion or focal pathologic process. Soft tissues and spinal canal: No prevertebral fluid or swelling. No visible canal hematoma. Upper chest: Trace to small volume left  pneumohemothorax. Other: None. IMPRESSION: 1. No acute intracranial abnormality. 2. No acute displaced fracture or traumatic listhesis of the cervical spine. 3. Trace to small volume left apical pneumothorax. These results were called by telephone at the time of interpretation on 10/14/2023 at 9:43 pm to provider Dr. Theresia Lo, who verbally acknowledged these results. Electronically Signed   By: Tish Frederickson [name]   On: 10/14/2023 21:56   CT CHEST ABDOMEN PELVIS W CONTRAST  Result Date: 10/14/2023 CLINICAL DATA:  Polytrauma, blunt.  Fall EXAM: CT CHEST, ABDOMEN, AND PELVIS WITH CONTRAST TECHNIQUE: Multidetector CT imaging of the chest, abdomen and pelvis was performed following the standard protocol during bolus administration of intravenous contrast. RADIATION DOSE REDUCTION: This exam was performed according to the departmental dose-optimization program which includes automated exposure control, adjustment of the mA and/or kV according to patient size and/or use of iterative reconstruction technique. CONTRAST:  75mL OMNIPAQUE IOHEXOL 350 MG/ML SOLN COMPARISON:  None Available. FINDINGS: CHEST: Cardiovascular: No aortic injury. The thoracic aorta is normal in caliber. The heart is normal in size. No significant pericardial effusion. Atherosclerotic plaque. Four-vessel coronary calcifications status post coronary artery bypass graft. Mediastinum/Nodes: No pneumomediastinum. No mediastinal hematoma. The esophagus is unremarkable. The thyroid is unremarkable. The central airways are patent. No mediastinal, hilar, or axillary lymphadenopathy. Lungs/Pleura: Bilateral lower lobe and lingular atelectasis. No focal consolidation. No pulmonary nodule. No pulmonary mass. No definite pulmonary contusion or laceration. No pneumatocele formation. No pleural effusion. No pneumothorax. No hemothorax. Musculoskeletal/Chest wall: No chest wall mass. No acute rib or sternal fracture. No spinal fracture. ABDOMEN / PELVIS:  Hepatobiliary: Not enlarged. No focal lesion. No laceration or subcapsular hematoma. The gallbladder is otherwise unremarkable with no radio-opaque gallstones. Contracted gallbladder. No biliary ductal dilatation. Pancreas: Normal pancreatic contour. No main pancreatic duct dilatation. Spleen: Splenule is enlarged measuring up to 13.5 cm. No focal lesion. No laceration, subcapsular hematoma, or vascular injury. Adrenals/Urinary Tract: No nodularity bilaterally. Bilateral kidneys enhance symmetrically. No hydronephrosis. No contusion, laceration, or subcapsular hematoma. No injury to the vascular structures or collecting systems. No hydroureter. The urinary bladder is unremarkable. Stomach/Bowel: No small or large bowel wall thickening or dilatation. The appendix is unremarkable. Vasculature/Lymphatics: At least moderate atherosclerotic plaque. No abdominal aorta or iliac aneurysm. No active contrast extravasation or pseudoaneurysm. No abdominal, pelvic, inguinal  lymphadenopathy. Reproductive: The prostate is enlarged measuring up to 4.9 cm. Other: No simple free fluid ascites. No pneumoperitoneum. No hemoperitoneum. No mesenteric hematoma identified. No organized fluid collection. Musculoskeletal: No significant soft tissue hematoma. Anterior abdominal wall subcutaneus soft tissue edema likely due to medication injection (3:89). No acute pelvic fracture. No spinal fracture. Ports and Devices: None. IMPRESSION: 1. At least small volume left hemopneumothorax. 2. Acute displaced left lateral fifth and sixth rib fractures. 3. Bilateral lower lobe and lingular atelectasis with superimposed developing contusions not excluded. 4.  No acute intra-abdominal, intrapelvic traumatic injury. 5. No acute fracture or traumatic malalignment of the thoracic or lumbar spine. 6. Other imaging findings of potential clinical significance: Mild splenomegaly. Prostatomegaly. Aortic Atherosclerosis (ICD10-I70.0). Electronically Signed    By: Tish Frederickson [name]   On: 10/14/2023 21:52   DG Pelvis Portable  Result Date: 10/14/2023 CLINICAL DATA:  fall EXAM: PORTABLE PELVIS 1-2 VIEWS COMPARISON:  CT abdomen pelvis 10/14/2023 FINDINGS: Limited evaluation due to overlapping osseous structures and overlying soft tissues. There is no evidence of pelvic fracture or diastasis. No acute displaced fracture or dislocation of either hips. No pelvic bone lesions are seen. IMPRESSION: Negative for acute traumatic injury. Electronically Signed   By: Tish Frederickson [name]   On: 10/14/2023 21:37   DG Chest Portable 1 View  Result Date: 10/14/2023 CLINICAL DATA:  fall EXAM: PORTABLE CHEST 1 VIEW COMPARISON:  CT C-spine 10/14/2023, chest x-ray 01/05/2023 FINDINGS: The heart and mediastinal contours are within normal limits. Vascular clips overlie the mediastinum. No focal consolidation. No pulmonary edema. No pleural effusion. Trace to small volume left apical pneumothorax. Trace left pleural fluid. No right pneumothorax. No right pleural effusion. Acute displaced lateral sixth rib fracture. Sternotomy wires are intact.  Cervical spine surgical hardware. IMPRESSION: 1. Small volume left hydropneumothorax. 2. Acute displaced lateral sixth rib fracture. Electronically Signed   By: Tish Frederickson [name]   On: 10/14/2023 21:36    Procedures .Critical Care  Performed by: Marita Kansas, PA-C Authorized by: Marita Kansas, PA-C   Critical care provider statement:    Critical care time (minutes):  30   Critical care was necessary to treat or prevent imminent or life-threatening deterioration of the following conditions: level 2 trauma.   Critical care was time spent personally by me on the following activities:  Development of treatment plan with patient or surrogate, discussions with consultants, evaluation of patient's response to treatment, examination of patient, ordering and review of laboratory studies, ordering and review of radiographic studies, ordering and  performing treatments and interventions, pulse oximetry, re-evaluation of patient's condition and review of old charts     Medications Ordered in ED Medications  fentaNYL (SUBLIMAZE) injection 50 mcg (has no administration in time range)  iohexol (OMNIPAQUE) 350 MG/ML injection 75 mL (75 mLs Intravenous Contrast Given 10/14/23 2036)  ondansetron (ZOFRAN) injection 4 mg (4 mg Intravenous Given 10/14/23 2133)  fentaNYL (SUBLIMAZE) injection 25 mcg (25 mcg Intravenous Given 10/14/23 2134)    ED Course/ Medical Decision Making/ A&P                                 Medical Decision Making Amount and/or Complexity of Data Reviewed Radiology: ordered.  Risk Prescription drug management.   Medical Decision Making / ED Course   This patient presents to the ED for concern of fall on thinners, this involves an extensive number of treatment options, and is a  complaint that carries with it a high risk of complications and morbidity.  The differential diagnosis includes acute intracranial bleed, other trauma and fracture  MDM: 77 year old male presents today as a level 2 follow-up thinners.  Complains of chest wall pain on the left side.  Hemodynamically stable.  On room air.  CBC is unremarkable, CMP with glucose of 166 otherwise without acute concern.  Initial troponin of 11.  CT chest abdomen pelvis with small hemopneumothorax with left fifth and sixth rib fractures.  CT head and cervical spine without acute intracranial or cervical spine pathology.  Chest x-ray with small hemopneumothorax, pelvis x-ray unremarkable.  Discussed with trauma surgeon.  They will admit for further management.   Lab Tests: -I ordered, reviewed, and interpreted labs.   The pertinent results include:   Labs Reviewed  COMPREHENSIVE METABOLIC PANEL - Abnormal; Notable for the following components:      Result Value   Glucose, Bld 166 (*)    AST 44 (*)    ALT 53 (*)    All other components within normal  limits  I-STAT CHEM 8, ED - Abnormal; Notable for the following components:   Creatinine, Ser 1.30 (*)    Glucose, Bld 159 (*)    Calcium, Ion 1.11 (*)    All other components within normal limits  CBC WITH DIFFERENTIAL/PLATELET  TROPONIN I (HIGH SENSITIVITY)      EKG  EKG Interpretation Date/Time:  Friday October 14 2023 19:54:15 EST Ventricular Rate:  72 PR Interval:  205 QRS Duration:  151 QT Interval:  438 QTC Calculation: 480 R Axis:   160  Text Interpretation: Sinus rhythm Right bundle branch block No significant change since last tracing Confirmed by Elayne Snare ([id number]) on 10/14/2023 8:08:38 PM         Imaging Studies ordered: I ordered imaging studies including chest x-ray, pelvic x-ray, CT chest abdomen pelvis with contrast, CT head, CT cervical spine I independently visualized and interpreted imaging. I agree with the radiologist interpretation   Medicines ordered and prescription drug management: Meds ordered this encounter  Medications   iohexol (OMNIPAQUE) 350 MG/ML injection 75 mL   ondansetron (ZOFRAN) injection 4 mg   fentaNYL (SUBLIMAZE) injection 25 mcg   fentaNYL (SUBLIMAZE) injection 50 mcg    -I have reviewed the patients home medicines and have made adjustments as needed  Consultations Obtained: I requested consultation with the trauma,  and discussed lab and imaging findings as well as pertinent plan - they recommend: as above    Reevaluation: After the interventions noted above, I reevaluated the patient and found that they have :stayed the same  Co morbidities that complicate the patient evaluation  Past Medical History:  Diagnosis Date   Acquired atrophy of thyroid 06/05/2013   Overview:  July 2014: controlled on synthroid since 2012 May 2015: decreased to Synthroid 50 mcg  Last Assessment & Plan:  Pt doing well with most recent TSH within normal range.  Will continue current dosage of Synthroid . Pt reminded to take  medication on empty stomach. Will continue to monitor with periodic laboratory assessment in 3 months.    Anemia    hemoglobin 7.4, iron deficiency, January, 2011, 2 unit transfusion, endoscopy normal, capsule endoscopy February, 2011 no small bowel abnormalities.   Most likely source gastric erosions, followed by GI   Anxiety    Bradycardia    CAD (coronary artery disease)    A. CABG in 2000,status post cardiac cath in 2006, 2009 ....continued  chest pain and SOB despite oral medication adjestments including Ranexa. B. Cath November 2009/ mRCA - 2.75 x 23 Abbott Xience V drug-eluting stent ...11/26/2008 to distal  RCA leading to acute marginal.  C. Cath 07/2012 for CP - stable anatomy, med rx. d. cath 2015 and 05/30/2015 stable anatomy, consider Myoview if has CP again   Carotid artery disease (HCC) 08/01/2015   Doppler, May 29, 2015, 1-39% bilateral ICA    Cerebral ischemia    MRI November, 2010, chronic microvascular ischemia   CKD (chronic kidney disease), stage III (HCC)    Concussion    Depression    Bipolar   Diabetes mellitus (HCC)    Edema    Essential hypertension 06/05/2013   Overview:  July 2014: Controlled with Bystolic Sep 2015: Changed to atenolol.  Last Assessment & Plan:  Will change to atenolol for cost and follow Improved.  Medication compliance strongly encouraged BP: 116/64 mmHg    Overview:  July 2014: Controlled with Bystolic Sep 2015: Changed to atenolol.  Last Assessment & Plan:  Will change to atenolol for cost and follow   Falling episodes    these have occurred in the past and again recurring 2011   Family history of adverse reaction to anesthesia    "mother died during bypass surgery but not sure if it has to do with anesthesia"   Gastric ulcer    GERD (gastroesophageal reflux disease)    H/O medication noncompliance    Due to loss of insurance   H/O multiple concussions    Hard of hearing    Heart murmur    History of blood transfusion 12/20/2013   History  of kidney stones    Hx of CABG    2000,  / one median sternotomy suture broken her chest x-ray November, 2010, no clinical significance   Hyperlipidemia    Hypertension    pt. denies   Iron deficiency anemia    Long term (current) use of anticoagulants [Z79.01] 07/20/2016   Low back pain 06/12/2009   Qualifier: Diagnosis of  By: Nena Jordan    Nephrolithiasis    Orthostasis    OSA (obstructive sleep apnea)    PAF (paroxysmal atrial fibrillation) (HCC)    a. dx 2017, started on amiodarone/warfarin but patient intermittently noncompliant.   PSVT (paroxysmal supraventricular tachycardia) (HCC)    RBBB 07/09/2009   Qualifier: Diagnosis of  By: Myrtis Ser, MD, [facility], Lemmie Evens    RLS (restless legs syndrome) 09/19/2009   Qualifier: Diagnosis of  By: Nena Jordan   Overview:  July 2014: Controlled with Mirapex  Last Assessment & Plan:  Patient is doing well. Will continue current management and follow clinically.   Seizure disorder (HCC) 01/09/2017   Spondylosis    C5-6, C6-7 MRI 2010   Syncope 03/2016   TBI (traumatic brain injury) (HCC) 2019   Thyroid disease    Tubulovillous adenoma of colon 2007   Vitamin D deficiency 05/17/2017   Wears glasses       Dispostion: Admitted to trauma service   Final Clinical Impression(s) / ED Diagnoses Final diagnoses:  Fall, initial encounter  Hemopneumothorax on left    Rx / DC Orders ED Discharge Orders     None         Marita Kansas, PA-C 10/14/23 2248    Elayne Snare K, DO 10/14/23 2302

## 2023-10-14 NOTE — ED Triage Notes (Signed)
The pt fell earlier tonight 0700 striking his head he is on a bloodd thinner he is c/o lt sided head pain  he was tangeled up in his dog  collar and lost his balance

## 2023-10-14 NOTE — H&P (Signed)
[name] [name] [birth year]  [id number].    HPI:  77 y/o M w/ a hx of hypothyroidism, CAD s/p CABG and DES, CKD, Depression, DM, HTN, GERD, OSA, pAFib (on Eliquis), and nephrolithiasis who presented to the ED after he sustained a mechanical fall.  He got caught up on his dog's leash and wound up on the floor.  Denies LOC. Endorsing left-sided chest and back pain to palpation. CT imaging shows left 5/6th rib fractures with small hemopneumothorax.  On exam, patient resting in bed.  Appears moderately uncomfortable.  NAD. Labs notable for Cr 1.3. Hb 13.3.  He is AF and HDS.   ROS: Review of Systems  Constitutional: Negative.   HENT: Negative.    Eyes: Negative.   Respiratory: Negative.    Cardiovascular: Negative.   Gastrointestinal: Negative.   Genitourinary: Negative.   Musculoskeletal:        Left chest pain with palpation  Skin: Negative.     Family History  Problem Relation Age of Onset   Pancreatic cancer Brother    Diabetes Brother    Coronary artery disease Brother    Stroke Brother    Diabetes Brother    Diabetes Mother    Heart failure Mother    Heart failure Father    Hypothyroidism Brother    Coronary artery disease Brother    Other Brother        colon surgery   Heart attack Other        Nephew   Irregular heart beat Daughter    Cancer Maternal Grandmother        unknown    Colon cancer Neg Hx    Stomach cancer Neg Hx    Liver cancer Neg Hx    Rectal cancer Neg Hx    Esophageal cancer Neg Hx     Past Medical History:  Diagnosis Date   Acquired atrophy of thyroid 06/05/2013   Overview:  July 2014: controlled on synthroid since 2012 May 2015: decreased to Synthroid 50 mcg  Last Assessment & Plan:  Pt doing well with most recent TSH within normal range.  Will continue current dosage of Synthroid . Pt reminded to take medication on empty stomach. Will continue to monitor with periodic laboratory assessment in 3 months.    Anemia     hemoglobin 7.4, iron deficiency, January, 2011, 2 unit transfusion, endoscopy normal, capsule endoscopy February, 2011 no small bowel abnormalities.   Most likely source gastric erosions, followed by GI   Anxiety    Bradycardia    CAD (coronary artery disease)    A. CABG in 2000,status post cardiac cath in 2006, 2009 ....continued chest pain and SOB despite oral medication adjestments including Ranexa. B. Cath November 2009/ mRCA - 2.75 x 23 Abbott Xience V drug-eluting stent ...11/26/2008 to distal  RCA leading to acute marginal.  C. Cath 07/2012 for CP - stable anatomy, med rx. d. cath 2015 and 05/30/2015 stable anatomy, consider Myoview if has CP again   Carotid artery disease (HCC) 08/01/2015   Doppler, May 29, 2015, 1-39% bilateral ICA    Cerebral ischemia    MRI November, 2010, chronic microvascular ischemia   CKD (chronic kidney disease), stage III (HCC)    Concussion    Depression    Bipolar   Diabetes mellitus (HCC)    Edema    Essential hypertension 06/05/2013   Overview:  July 2014: Controlled with Bystolic Sep 2015: Changed to atenolol.  Last Assessment & Plan:  Will change to atenolol for cost and follow Improved.  Medication compliance strongly encouraged BP: 116/64 mmHg    Overview:  July 2014: Controlled with Bystolic Sep 2015: Changed to atenolol.  Last Assessment & Plan:  Will change to atenolol for cost and follow   Falling episodes    these have occurred in the past and again recurring 2011   Family history of adverse reaction to anesthesia    "mother died during bypass surgery but not sure if it has to do with anesthesia"   Gastric ulcer    GERD (gastroesophageal reflux disease)    H/O medication noncompliance    Due to loss of insurance   H/O multiple concussions    Hard of hearing    Heart murmur    History of blood transfusion 12/20/2013   History of kidney stones    Hx of CABG    2000,  / one median sternotomy suture broken her chest x-ray November, 2010, no  clinical significance   Hyperlipidemia    Hypertension    pt. denies   Iron deficiency anemia    Long term (current) use of anticoagulants [Z79.01] 07/20/2016   Low back pain 06/12/2009   Qualifier: Diagnosis of  By: Nena Jordan    Nephrolithiasis    Orthostasis    OSA (obstructive sleep apnea)    PAF (paroxysmal atrial fibrillation) (HCC)    a. dx 2017, started on amiodarone/warfarin but patient intermittently noncompliant.   PSVT (paroxysmal supraventricular tachycardia) (HCC)    RBBB 07/09/2009   Qualifier: Diagnosis of  By: Myrtis Ser, MD, [facility], Lemmie Evens    RLS (restless legs syndrome) 09/19/2009   Qualifier: Diagnosis of  By: Nena Jordan   Overview:  July 2014: Controlled with Mirapex  Last Assessment & Plan:  Patient is doing well. Will continue current management and follow clinically.   Seizure disorder (HCC) 01/09/2017   Spondylosis    C5-6, C6-7 MRI 2010   Syncope 03/2016   TBI (traumatic brain injury) (HCC) 2019   Thyroid disease    Tubulovillous adenoma of colon 2007   Vitamin D deficiency 05/17/2017   Wears glasses     Past Surgical History:  Procedure Laterality Date   ANTERIOR CERVICAL DECOMP/DISCECTOMY FUSION N/A 05/31/2016   Procedure: ANTERIOR CERVICAL DECOMPRESSION/DISCECTOMY FUSION CERVICAL FIVE-SIX,CERVICAL SIX-SEVEN;  Surgeon: Julio Sicks, MD;  Location: [facility] NEURO ORS;  Service: Neurosurgery;  Laterality: N/A;   CARDIAC CATHETERIZATION N/A 05/30/2015   Procedure: Left Heart Cath and Coronary Angiography;  Surgeon: Marykay Lex, MD;  Location: [facility] INVASIVE CV LAB;  Service: Cardiovascular;  Laterality: N/A;   CATARACT EXTRACTION     COLONOSCOPY     CORONARY ARTERY BYPASS GRAFT     2000   ESOPHAGOGASTRODUODENOSCOPY     LEFT HEART CATH AND CORS/GRAFTS ANGIOGRAPHY N/A 12/15/2017   Procedure: LEFT HEART CATH AND CORS/GRAFTS ANGIOGRAPHY;  Surgeon: Corky Crafts, MD;  Location: [facility] INVASIVE CV LAB;  Service: Cardiovascular;  Laterality: N/A;    LEFT HEART CATHETERIZATION WITH CORONARY/GRAFT ANGIOGRAM N/A 08/01/2012   Procedure: LEFT HEART CATHETERIZATION WITH Isabel Caprice;  Surgeon: Herby Abraham, MD;  Location: [facility] CATH LAB;  Service: Cardiovascular;  Laterality: N/A;   LEFT HEART CATHETERIZATION WITH CORONARY/GRAFT ANGIOGRAM N/A 01/03/2015   Procedure: LEFT HEART CATHETERIZATION WITH Isabel Caprice;  Surgeon: Runell Gess, MD;  Location: [facility] CATH LAB;  Service: Cardiovascular;  Laterality: N/A;   NASAL SEPTUM SURGERY     UP3   PERCUTANEOUS CORONARY  STENT INTERVENTION (PCI-S)  10/06/2008   mRCA PCI  2.75 x 23 Abbott Xience V drug-eluting stent    RIGHT/LEFT HEART CATH AND CORONARY/GRAFT ANGIOGRAPHY N/A 01/07/2020   Procedure: RIGHT/LEFT HEART CATH AND CORONARY/GRAFT ANGIOGRAPHY;  Surgeon: Lyn Records, MD;  Location: [facility] INVASIVE CV LAB;  Service: Cardiovascular;  Laterality: N/A;   TOTAL KNEE ARTHROPLASTY Right 09/22/2021   Procedure: RIGHT TOTAL KNEE ARTHROPLASTY;  Surgeon: Marcene Corning, MD;  Location: [facility] ORS;  Service: Orthopedics;  Laterality: Right;   ULTRASOUND GUIDANCE FOR VASCULAR ACCESS  12/15/2017   Procedure: Ultrasound Guidance For Vascular Access;  Surgeon: Corky Crafts, MD;  Location: [facility] INVASIVE CV LAB;  Service: Cardiovascular;;    Social History:  reports that he has never smoked. He has never been exposed to tobacco smoke. He has never used smokeless tobacco. He reports that he does not drink alcohol and does not use drugs.  Allergies:  Allergies  Allergen Reactions   Morphine Other (See Comments)    Hallucinations   Shellfish-Derived Products Itching    (Not in a hospital admission)   Physical Exam: Blood pressure 138/60, pulse 69, temperature 98 F (36.7 C), temperature source Oral, resp. rate (!) 23, height 5\' 8"  (1.727 m), weight 75.8 kg, SpO2 98%. Gen: elderly male, NAD HEENT: atraumatic Resp: equal chest rise, TTP along the left side of the rib cage, no  clicking, no crepitus  CV: HR 80s on monitor, BP 120s/60s Abd: soft, non-distended, non-tender Neuro: moving all extremities  Results for orders placed or performed during the hospital encounter of 10/14/23 (from the past 48 hour(s))  CBC with Differential     Status: None   Collection Time: 10/14/23  7:49 PM  Result Value Ref Range   WBC 9.2 4.0 - 10.5 K/uL   RBC 4.47 4.22 - 5.81 MIL/uL   Hemoglobin 13.9 13.0 - 17.0 g/dL   HCT 16.1 09.6 - 04.5 %   MCV 88.8 80.0 - 100.0 fL   MCH 31.1 26.0 - 34.0 pg   MCHC 35.0 30.0 - 36.0 g/dL   RDW 40.9 81.1 - 91.4 %   Platelets 172 150 - 400 K/uL   nRBC 0.0 0.0 - 0.2 %   Neutrophils Relative % 75 %   Neutro Abs 6.9 1.7 - 7.7 K/uL   Lymphocytes Relative 15 %   Lymphs Abs 1.4 0.7 - 4.0 K/uL   Monocytes Relative 7 %   Monocytes Absolute 0.6 0.1 - 1.0 K/uL   Eosinophils Relative 2 %   Eosinophils Absolute 0.2 0.0 - 0.5 K/uL   Basophils Relative 0 %   Basophils Absolute 0.0 0.0 - 0.1 K/uL   Immature Granulocytes 1 %   Abs Immature Granulocytes 0.06 0.00 - 0.07 K/uL    Comment: Performed at [facility], [address], [place], [place] [address]  Comprehensive metabolic panel     Status: Abnormal   Collection Time: 10/14/23  7:49 PM  Result Value Ref Range   Sodium 137 135 - 145 mmol/L   Potassium 4.1 3.5 - 5.1 mmol/L   Chloride 103 98 - 111 mmol/L   CO2 23 22 - 32 mmol/L   Glucose, Bld 166 (H) 70 - 99 mg/dL    Comment: Glucose reference range applies only to samples taken after fasting for at least 8 hours.   BUN 19 8 - 23 mg/dL   Creatinine, Ser 6.21 0.61 - 1.24 mg/dL   Calcium 9.3 8.9 - 30.8 mg/dL   Total Protein  6.5 6.5 - 8.1 g/dL   Albumin 3.8 3.5 - 5.0 g/dL   AST 44 (H) 15 - 41 U/L   ALT 53 (H) 0 - 44 U/L   Alkaline Phosphatase 77 38 - 126 U/L   Total Bilirubin 0.7 <1.2 mg/dL   GFR, Estimated >32 >44 mL/min    Comment: (NOTE) Calculated using the CKD-EPI Creatinine Equation (2021)    Anion gap 11 5 - 15    Comment:  Performed at [facility], [address], [place], [place] [address]  Troponin I (High Sensitivity)     Status: None   Collection Time: 10/14/23  7:49 PM  Result Value Ref Range   Troponin I (High Sensitivity) 11 <18 ng/L    Comment: (NOTE) Elevated high sensitivity troponin I (hsTnI) values and significant  changes across serial measurements may suggest ACS but many other  chronic and acute conditions are known to elevate hsTnI results.  Refer to the "Links" section for chest pain algorithms and additional  guidance. Performed at [facility], [address] [place], [place] [address]   I-stat chem 8, ED (not at [facility], [facility] or [facility])     Status: Abnormal   Collection Time: 10/14/23  8:10 PM  Result Value Ref Range   Sodium 137 135 - 145 mmol/L   Potassium 3.9 3.5 - 5.1 mmol/L   Chloride 102 98 - 111 mmol/L   BUN 21 8 - 23 mg/dL   Creatinine, Ser 4.40 (H) 0.61 - 1.24 mg/dL   Glucose, Bld 347 (H) 70 - 99 mg/dL    Comment: Glucose reference range applies only to samples taken after fasting for at least 8 hours.   Calcium, Ion 1.11 (L) 1.15 - 1.40 mmol/L   TCO2 23 22 - 32 mmol/L   Hemoglobin 13.3 13.0 - 17.0 g/dL   HCT 42.5 95.6 - 38.7 %   *Note: Due to a large number of results and/or encounters for the requested time period, some results have not been displayed. A complete set of results can be found in Results Review.   CT Head Wo Contrast  Result Date: 10/14/2023 CLINICAL DATA:  Head trauma, moderate-severe Fall on thinners; Polytrauma, blunt striking his head he is on a bloodd thinner he is c/o lt sided head pain he was tangeled up in his dog collar and lost his balance EXAM: CT HEAD WITHOUT CONTRAST CT CERVICAL SPINE WITHOUT CONTRAST TECHNIQUE: Multidetector CT imaging of the head and cervical spine was performed following the standard protocol without intravenous contrast. Multiplanar CT image reconstructions of the cervical spine were also generated. RADIATION DOSE  REDUCTION: This exam was performed according to the departmental dose-optimization program which includes automated exposure control, adjustment of the mA and/or kV according to patient size and/or use of iterative reconstruction technique. COMPARISON:  None Available. FINDINGS: CT HEAD FINDINGS Brain: Cerebral ventricle sizes are concordant with the degree of cerebral volume loss. Patchy and confluent areas of decreased attenuation are noted throughout the deep and periventricular white matter of the cerebral hemispheres bilaterally, compatible with chronic microvascular ischemic disease. No evidence of large-territorial acute infarction. No parenchymal hemorrhage. No mass lesion. No extra-axial collection. No mass effect or midline shift. No hydrocephalus. Basilar cisterns are patent. Vascular: No hyperdense vessel. Atherosclerotic calcifications are present within the cavernous internal carotid and vertebral arteries. Skull: No acute fracture or focal lesion. Sinuses/Orbits: Left sphenoid sinus mucosal thickening. Other paranasal sinuses and mastoid air cells are clear. Bilateral lens replacement. Otherwise  the orbits are unremarkable. Other: None. CT CERVICAL SPINE FINDINGS Alignment: Normal. Skull base and vertebrae: C5-C7 anterior cervical discectomy and fusion. Multilevel moderate degenerative changes spine. No associated severe osseous neural foraminal or central canal stenosis. No acute fracture. No aggressive appearing focal osseous lesion or focal pathologic process. Soft tissues and spinal canal: No prevertebral fluid or swelling. No visible canal hematoma. Upper chest: Trace to small volume left pneumohemothorax. Other: None. IMPRESSION: 1. No acute intracranial abnormality. 2. No acute displaced fracture or traumatic listhesis of the cervical spine. 3. Trace to small volume left apical pneumothorax. These results were called by telephone at the time of interpretation on 10/14/2023 at 9:43 pm to provider  Dr. Theresia Lo, who verbally acknowledged these results. Electronically Signed   By: Tish Frederickson [name]   On: 10/14/2023 21:56   CT Cervical Spine Wo Contrast  Result Date: 10/14/2023 CLINICAL DATA:  Head trauma, moderate-severe Fall on thinners; Polytrauma, blunt striking his head he is on a bloodd thinner he is c/o lt sided head pain he was tangeled up in his dog collar and lost his balance EXAM: CT HEAD WITHOUT CONTRAST CT CERVICAL SPINE WITHOUT CONTRAST TECHNIQUE: Multidetector CT imaging of the head and cervical spine was performed following the standard protocol without intravenous contrast. Multiplanar CT image reconstructions of the cervical spine were also generated. RADIATION DOSE REDUCTION: This exam was performed according to the departmental dose-optimization program which includes automated exposure control, adjustment of the mA and/or kV according to patient size and/or use of iterative reconstruction technique. COMPARISON:  None Available. FINDINGS: CT HEAD FINDINGS Brain: Cerebral ventricle sizes are concordant with the degree of cerebral volume loss. Patchy and confluent areas of decreased attenuation are noted throughout the deep and periventricular white matter of the cerebral hemispheres bilaterally, compatible with chronic microvascular ischemic disease. No evidence of large-territorial acute infarction. No parenchymal hemorrhage. No mass lesion. No extra-axial collection. No mass effect or midline shift. No hydrocephalus. Basilar cisterns are patent. Vascular: No hyperdense vessel. Atherosclerotic calcifications are present within the cavernous internal carotid and vertebral arteries. Skull: No acute fracture or focal lesion. Sinuses/Orbits: Left sphenoid sinus mucosal thickening. Other paranasal sinuses and mastoid air cells are clear. Bilateral lens replacement. Otherwise the orbits are unremarkable. Other: None. CT CERVICAL SPINE FINDINGS Alignment: Normal. Skull base and vertebrae:  C5-C7 anterior cervical discectomy and fusion. Multilevel moderate degenerative changes spine. No associated severe osseous neural foraminal or central canal stenosis. No acute fracture. No aggressive appearing focal osseous lesion or focal pathologic process. Soft tissues and spinal canal: No prevertebral fluid or swelling. No visible canal hematoma. Upper chest: Trace to small volume left pneumohemothorax. Other: None. IMPRESSION: 1. No acute intracranial abnormality. 2. No acute displaced fracture or traumatic listhesis of the cervical spine. 3. Trace to small volume left apical pneumothorax. These results were called by telephone at the time of interpretation on 10/14/2023 at 9:43 pm to provider Dr. Theresia Lo, who verbally acknowledged these results. Electronically Signed   By: Tish Frederickson [name]   On: 10/14/2023 21:56   CT CHEST ABDOMEN PELVIS W CONTRAST  Result Date: 10/14/2023 CLINICAL DATA:  Polytrauma, blunt.  Fall EXAM: CT CHEST, ABDOMEN, AND PELVIS WITH CONTRAST TECHNIQUE: Multidetector CT imaging of the chest, abdomen and pelvis was performed following the standard protocol during bolus administration of intravenous contrast. RADIATION DOSE REDUCTION: This exam was performed according to the departmental dose-optimization program which includes automated exposure control, adjustment of the mA and/or kV according to patient size and/or use  of iterative reconstruction technique. CONTRAST:  75mL OMNIPAQUE IOHEXOL 350 MG/ML SOLN COMPARISON:  None Available. FINDINGS: CHEST: Cardiovascular: No aortic injury. The thoracic aorta is normal in caliber. The heart is normal in size. No significant pericardial effusion. Atherosclerotic plaque. Four-vessel coronary calcifications status post coronary artery bypass graft. Mediastinum/Nodes: No pneumomediastinum. No mediastinal hematoma. The esophagus is unremarkable. The thyroid is unremarkable. The central airways are patent. No mediastinal, hilar, or axillary  lymphadenopathy. Lungs/Pleura: Bilateral lower lobe and lingular atelectasis. No focal consolidation. No pulmonary nodule. No pulmonary mass. No definite pulmonary contusion or laceration. No pneumatocele formation. No pleural effusion. No pneumothorax. No hemothorax. Musculoskeletal/Chest wall: No chest wall mass. No acute rib or sternal fracture. No spinal fracture. ABDOMEN / PELVIS: Hepatobiliary: Not enlarged. No focal lesion. No laceration or subcapsular hematoma. The gallbladder is otherwise unremarkable with no radio-opaque gallstones. Contracted gallbladder. No biliary ductal dilatation. Pancreas: Normal pancreatic contour. No main pancreatic duct dilatation. Spleen: Splenule is enlarged measuring up to 13.5 cm. No focal lesion. No laceration, subcapsular hematoma, or vascular injury. Adrenals/Urinary Tract: No nodularity bilaterally. Bilateral kidneys enhance symmetrically. No hydronephrosis. No contusion, laceration, or subcapsular hematoma. No injury to the vascular structures or collecting systems. No hydroureter. The urinary bladder is unremarkable. Stomach/Bowel: No small or large bowel wall thickening or dilatation. The appendix is unremarkable. Vasculature/Lymphatics: At least moderate atherosclerotic plaque. No abdominal aorta or iliac aneurysm. No active contrast extravasation or pseudoaneurysm. No abdominal, pelvic, inguinal lymphadenopathy. Reproductive: The prostate is enlarged measuring up to 4.9 cm. Other: No simple free fluid ascites. No pneumoperitoneum. No hemoperitoneum. No mesenteric hematoma identified. No organized fluid collection. Musculoskeletal: No significant soft tissue hematoma. Anterior abdominal wall subcutaneus soft tissue edema likely due to medication injection (3:89). No acute pelvic fracture. No spinal fracture. Ports and Devices: None. IMPRESSION: 1. At least small volume left hemopneumothorax. 2. Acute displaced left lateral fifth and sixth rib fractures. 3. Bilateral  lower lobe and lingular atelectasis with superimposed developing contusions not excluded. 4.  No acute intra-abdominal, intrapelvic traumatic injury. 5. No acute fracture or traumatic malalignment of the thoracic or lumbar spine. 6. Other imaging findings of potential clinical significance: Mild splenomegaly. Prostatomegaly. Aortic Atherosclerosis (ICD10-I70.0). Electronically Signed   By: Tish Frederickson [name]   On: 10/14/2023 21:52   DG Pelvis Portable  Result Date: 10/14/2023 CLINICAL DATA:  fall EXAM: PORTABLE PELVIS 1-2 VIEWS COMPARISON:  CT abdomen pelvis 10/14/2023 FINDINGS: Limited evaluation due to overlapping osseous structures and overlying soft tissues. There is no evidence of pelvic fracture or diastasis. No acute displaced fracture or dislocation of either hips. No pelvic bone lesions are seen. IMPRESSION: Negative for acute traumatic injury. Electronically Signed   By: Tish Frederickson [name]   On: 10/14/2023 21:37   DG Chest Portable 1 View  Result Date: 10/14/2023 CLINICAL DATA:  fall EXAM: PORTABLE CHEST 1 VIEW COMPARISON:  CT C-spine 10/14/2023, chest x-ray 01/05/2023 FINDINGS: The heart and mediastinal contours are within normal limits. Vascular clips overlie the mediastinum. No focal consolidation. No pulmonary edema. No pleural effusion. Trace to small volume left apical pneumothorax. Trace left pleural fluid. No right pneumothorax. No right pleural effusion. Acute displaced lateral sixth rib fracture. Sternotomy wires are intact.  Cervical spine surgical hardware. IMPRESSION: 1. Small volume left hydropneumothorax. 2. Acute displaced lateral sixth rib fracture. Electronically Signed   By: Tish Frederickson [name]   On: 10/14/2023 21:36    Assessment/Plan 77 y/o M with multiple comorbidity who presented after a mechanical fall and has a  CT showing left-sided rib fx and hemopneumothorax  L 5/6th rib fx - pain control, pulm toilet, IS Hemopneumothorax - no sign of active extrav, repeat CBC  in AM, Repeat CXR  CAD s/p CABG and DES (POA) - Home meds, cardiac monitor Hypothyroidism (POA) - Home synthroid DM (POA) - SSI Depression (POA) - continue home meds GERD (POA)  - Protonix CKD (POA) - Avoid nephrotoxic meds, monitor UOP, daily BMP pAFib (POA) - cardiac monitor, holding Eliquis    FEN - Regular VTE - Holding ID - None indicated  Admit - med/surg  I reviewed last 24 h vitals and pain scores, last 24 h labs and trends, and last 24 h imaging results.  T[facility] 10/14/2023, 10:34 PM Please see Amion for pager number during day hours 7:00am-4:30pm or 7:00am -11:30am on weekends

## 2023-10-14 NOTE — Progress Notes (Signed)
Orthopedic Tech Progress Note Patient Details:  [name] [name] 04-07-1946 [id number]  Level 2 trauma  Patient ID: [name] [name], male   DOB: 10/07/1946, 77 y.o.   MRN: [id number]  [name] 10/14/2023, 8:18 PM

## 2023-10-14 NOTE — Progress Notes (Signed)
   10/14/23 2027  Spiritual Encounters  Type of Visit Initial  Care provided to: Pt and family  Conversation partners present during encounter Nurse  Referral source Trauma page  Reason for visit Trauma  OnCall Visit Yes  Spiritual Framework  Presenting Themes Caregiving needs;Impactful experiences and emotions;Community and relationships  Community/Connection Family  Patient Stress Factors Health changes  Family Stress Factors Major life changes  Interventions  Spiritual Care Interventions Made Established relationship of care and support;Compassionate presence;Reflective listening;Normalization of emotions;Encouragement  Intervention Outcomes  Outcomes Connection to spiritual care;Awareness of support;Reduced isolation;Reduced anxiety  Spiritual Care Plan  Spiritual Care Issues Still Outstanding No further spiritual care needs at this time (see row info)   Chaplain greeted patient. Patient stated his daughter [name] was in the waiting room. Chaplain met patient's daughter and provided spiritual and emotional support through compassionate presence and reflective listening. Chaplain assisted patient's daughter back to the patient's room. No further needs at this time.   Arlyce Dice, Chaplain Resident [phone]

## 2023-10-15 ENCOUNTER — Inpatient Hospital Stay (HOSPITAL_COMMUNITY): Payer: Medicare Other

## 2023-10-15 DIAGNOSIS — I951 Orthostatic hypotension: Secondary | ICD-10-CM | POA: Diagnosis present

## 2023-10-15 DIAGNOSIS — Z96651 Presence of right artificial knee joint: Secondary | ICD-10-CM | POA: Diagnosis present

## 2023-10-15 DIAGNOSIS — I2510 Atherosclerotic heart disease of native coronary artery without angina pectoris: Secondary | ICD-10-CM | POA: Diagnosis present

## 2023-10-15 DIAGNOSIS — I4719 Other supraventricular tachycardia: Secondary | ICD-10-CM | POA: Diagnosis present

## 2023-10-15 DIAGNOSIS — R42 Dizziness and giddiness: Secondary | ICD-10-CM | POA: Diagnosis not present

## 2023-10-15 DIAGNOSIS — I480 Paroxysmal atrial fibrillation: Secondary | ICD-10-CM | POA: Diagnosis present

## 2023-10-15 DIAGNOSIS — S272XXA Traumatic hemopneumothorax, initial encounter: Secondary | ICD-10-CM | POA: Diagnosis present

## 2023-10-15 DIAGNOSIS — S3681XA Injury of peritoneum, initial encounter: Secondary | ICD-10-CM | POA: Diagnosis not present

## 2023-10-15 DIAGNOSIS — J90 Pleural effusion, not elsewhere classified: Secondary | ICD-10-CM | POA: Diagnosis not present

## 2023-10-15 DIAGNOSIS — E785 Hyperlipidemia, unspecified: Secondary | ICD-10-CM | POA: Diagnosis present

## 2023-10-15 DIAGNOSIS — Z951 Presence of aortocoronary bypass graft: Secondary | ICD-10-CM | POA: Diagnosis not present

## 2023-10-15 DIAGNOSIS — G2581 Restless legs syndrome: Secondary | ICD-10-CM | POA: Diagnosis present

## 2023-10-15 DIAGNOSIS — Z794 Long term (current) use of insulin: Secondary | ICD-10-CM | POA: Diagnosis not present

## 2023-10-15 DIAGNOSIS — R918 Other nonspecific abnormal finding of lung field: Secondary | ICD-10-CM | POA: Diagnosis not present

## 2023-10-15 DIAGNOSIS — G40909 Epilepsy, unspecified, not intractable, without status epilepticus: Secondary | ICD-10-CM | POA: Diagnosis present

## 2023-10-15 DIAGNOSIS — J9811 Atelectasis: Secondary | ICD-10-CM | POA: Diagnosis not present

## 2023-10-15 DIAGNOSIS — T1490XA Injury, unspecified, initial encounter: Secondary | ICD-10-CM | POA: Diagnosis present

## 2023-10-15 DIAGNOSIS — E1122 Type 2 diabetes mellitus with diabetic chronic kidney disease: Secondary | ICD-10-CM | POA: Diagnosis present

## 2023-10-15 DIAGNOSIS — Z79890 Hormone replacement therapy (postmenopausal): Secondary | ICD-10-CM | POA: Diagnosis not present

## 2023-10-15 DIAGNOSIS — I129 Hypertensive chronic kidney disease with stage 1 through stage 4 chronic kidney disease, or unspecified chronic kidney disease: Secondary | ICD-10-CM | POA: Diagnosis present

## 2023-10-15 DIAGNOSIS — K219 Gastro-esophageal reflux disease without esophagitis: Secondary | ICD-10-CM | POA: Diagnosis present

## 2023-10-15 DIAGNOSIS — J939 Pneumothorax, unspecified: Secondary | ICD-10-CM | POA: Diagnosis not present

## 2023-10-15 DIAGNOSIS — W01198A Fall on same level from slipping, tripping and stumbling with subsequent striking against other object, initial encounter: Secondary | ICD-10-CM | POA: Diagnosis present

## 2023-10-15 DIAGNOSIS — E119 Type 2 diabetes mellitus without complications: Secondary | ICD-10-CM | POA: Diagnosis not present

## 2023-10-15 DIAGNOSIS — F32A Depression, unspecified: Secondary | ICD-10-CM | POA: Diagnosis present

## 2023-10-15 DIAGNOSIS — E1165 Type 2 diabetes mellitus with hyperglycemia: Secondary | ICD-10-CM | POA: Diagnosis present

## 2023-10-15 DIAGNOSIS — Y92009 Unspecified place in unspecified non-institutional (private) residence as the place of occurrence of the external cause: Secondary | ICD-10-CM | POA: Diagnosis not present

## 2023-10-15 DIAGNOSIS — S2242XA Multiple fractures of ribs, left side, initial encounter for closed fracture: Secondary | ICD-10-CM | POA: Diagnosis not present

## 2023-10-15 DIAGNOSIS — F0393 Unspecified dementia, unspecified severity, with mood disturbance: Secondary | ICD-10-CM | POA: Diagnosis present

## 2023-10-15 DIAGNOSIS — Z7901 Long term (current) use of anticoagulants: Secondary | ICD-10-CM | POA: Diagnosis not present

## 2023-10-15 DIAGNOSIS — D509 Iron deficiency anemia, unspecified: Secondary | ICD-10-CM | POA: Diagnosis present

## 2023-10-15 DIAGNOSIS — N183 Chronic kidney disease, stage 3 unspecified: Secondary | ICD-10-CM | POA: Diagnosis present

## 2023-10-15 DIAGNOSIS — E1142 Type 2 diabetes mellitus with diabetic polyneuropathy: Secondary | ICD-10-CM | POA: Diagnosis present

## 2023-10-15 DIAGNOSIS — E034 Atrophy of thyroid (acquired): Secondary | ICD-10-CM | POA: Diagnosis present

## 2023-10-15 DIAGNOSIS — I517 Cardiomegaly: Secondary | ICD-10-CM | POA: Diagnosis not present

## 2023-10-15 LAB — BASIC METABOLIC PANEL
Anion gap: 6 (ref 5–15)
BUN: 16 mg/dL (ref 8–23)
CO2: 27 mmol/L (ref 22–32)
Calcium: 8.8 mg/dL — ABNORMAL LOW (ref 8.9–10.3)
Chloride: 104 mmol/L (ref 98–111)
Creatinine, Ser: 1.37 mg/dL — ABNORMAL HIGH (ref 0.61–1.24)
GFR, Estimated: 54 mL/min — ABNORMAL LOW (ref >60)
Glucose, Bld: 200 mg/dL — ABNORMAL HIGH (ref 70–99)
Potassium: 3.7 mmol/L (ref 3.5–5.1)
Sodium: 137 mmol/L (ref 135–145)

## 2023-10-15 LAB — CBC
HCT: 39.3 % (ref 39.0–52.0)
HCT: 37.4 % — ABNORMAL LOW (ref 39.0–52.0)
Hemoglobin: 13.6 g/dL (ref 13.0–17.0)
Hemoglobin: 13.2 g/dL (ref 13.0–17.0)
MCH: 31.1 pg (ref 26.0–34.0)
MCH: 31.7 pg (ref 26.0–34.0)
MCHC: 34.6 g/dL (ref 30.0–36.0)
MCHC: 35.3 g/dL (ref 30.0–36.0)
MCV: 89.9 fL (ref 80.0–100.0)
MCV: 89.7 fL (ref 80.0–100.0)
Platelets: 156 10*3/uL (ref 150–400)
Platelets: 144 10*3/uL — ABNORMAL LOW (ref 150–400)
RBC: 4.37 MIL/uL (ref 4.22–5.81)
RBC: 4.17 MIL/uL — ABNORMAL LOW (ref 4.22–5.81)
RDW: 13.7 % (ref 11.5–15.5)
RDW: 14 % (ref 11.5–15.5)
WBC: 10 10*3/uL (ref 4.0–10.5)
WBC: 8.8 10*3/uL (ref 4.0–10.5)
nRBC: 0 % (ref 0.0–0.2)
nRBC: 0 % (ref 0.0–0.2)

## 2023-10-15 LAB — CBG MONITORING, ED: Glucose-Capillary: 63 mg/dL — ABNORMAL LOW (ref 70–99)

## 2023-10-15 LAB — GLUCOSE, CAPILLARY
Glucose-Capillary: 107 mg/dL — ABNORMAL HIGH (ref 70–99)
Glucose-Capillary: 148 mg/dL — ABNORMAL HIGH (ref 70–99)
Glucose-Capillary: 128 mg/dL — ABNORMAL HIGH (ref 70–99)
Glucose-Capillary: 127 mg/dL — ABNORMAL HIGH (ref 70–99)
Glucose-Capillary: 131 mg/dL — ABNORMAL HIGH (ref 70–99)
Glucose-Capillary: 78 mg/dL (ref 70–99)

## 2023-10-15 LAB — CREATININE, SERUM
Creatinine, Ser: 1.2 mg/dL (ref 0.61–1.24)
GFR, Estimated: >60 mL/min (ref >60)

## 2023-10-15 MED ORDER — HYDROXYZINE HCL 10 MG PO TABS: 10.0000 mg | ORAL_TABLET | Freq: Three times a day (TID) | ORAL | Status: DC | PRN

## 2023-10-15 MED ORDER — OXYCODONE HCL 5 MG PO TABS
5.0000 mg | ORAL_TABLET | ORAL | Status: DC | PRN
  Administered 2023-10-15 – 2023-10-17 (×3): 5 mg via ORAL
  Filled 2023-10-15 (×3): qty 1

## 2023-10-15 MED ORDER — AMITRIPTYLINE HCL 10 MG PO TABS
10.0000 mg | ORAL_TABLET | Freq: Every day | ORAL | Status: DC
Start: 2023-10-15 — End: 2023-10-18
  Administered 2023-10-15 – 2023-10-17 (×3): 30 mg via ORAL
  Filled 2023-10-15 (×3): qty 3

## 2023-10-15 MED ORDER — DAPAGLIFLOZIN PROPANEDIOL 10 MG PO TABS
10.0000 mg | ORAL_TABLET | Freq: Every day | ORAL | Status: DC
  Administered 2023-10-15 – 2023-10-19 (×5): 10 mg via ORAL
  Filled 2023-10-15 (×5): qty 1

## 2023-10-15 MED ORDER — QUETIAPINE FUMARATE 50 MG PO TABS
25.0000 mg | ORAL_TABLET | Freq: Every day | ORAL | Status: DC
  Administered 2023-10-15 – 2023-10-18 (×4): 25 mg via ORAL
  Filled 2023-10-15 (×4): qty 1

## 2023-10-15 MED ORDER — ACETAMINOPHEN 500 MG PO TABS
1000.0000 mg | ORAL_TABLET | Freq: Four times a day (QID) | ORAL | Status: DC
  Administered 2023-10-15 – 2023-10-19 (×17): 1000 mg via ORAL
  Filled 2023-10-15 (×18): qty 2

## 2023-10-15 MED ORDER — POLYETHYLENE GLYCOL 3350 17 G PO PACK: 17.0000 g | PACK | Freq: Every day | ORAL | Status: DC | PRN

## 2023-10-15 MED ORDER — PRAMIPEXOLE DIHYDROCHLORIDE 0.25 MG PO TABS
0.5000 mg | ORAL_TABLET | Freq: Every day | ORAL | Status: DC
Start: 2023-10-15 — End: 2023-10-19
  Administered 2023-10-15: 0.5 mg via ORAL
  Administered 2023-10-16 – 2023-10-18 (×3): 1 mg via ORAL
  Filled 2023-10-15 (×2): qty 4
  Filled 2023-10-15: qty 2
  Filled 2023-10-15: qty 4

## 2023-10-15 MED ORDER — INSULIN ASPART 100 UNIT/ML IJ SOLN: 3.0000 [IU] | Freq: Three times a day (TID) | INTRAMUSCULAR | Status: DC

## 2023-10-15 MED ORDER — INSULIN GLARGINE-YFGN 100 UNIT/ML ~~LOC~~ SOLN
40.0000 [IU] | Freq: Every day | SUBCUTANEOUS | Status: DC
  Administered 2023-10-15 – 2023-10-19 (×5): 40 [IU] via SUBCUTANEOUS
  Filled 2023-10-15 (×5): qty 0.4

## 2023-10-15 MED ORDER — SERTRALINE HCL 100 MG PO TABS
100.0000 mg | ORAL_TABLET | Freq: Every day | ORAL | Status: DC
  Administered 2023-10-15 – 2023-10-19 (×5): 100 mg via ORAL
  Filled 2023-10-15 (×5): qty 1

## 2023-10-15 MED ORDER — MEMANTINE HCL 10 MG PO TABS
10.0000 mg | ORAL_TABLET | Freq: Two times a day (BID) | ORAL | Status: DC
  Administered 2023-10-15 – 2023-10-19 (×10): 10 mg via ORAL
  Filled 2023-10-15 (×11): qty 1

## 2023-10-15 MED ORDER — INSULIN ASPART 100 UNIT/ML IJ SOLN
8.0000 [IU] | Freq: Three times a day (TID) | INTRAMUSCULAR | Status: DC
  Administered 2023-10-15 – 2023-10-19 (×10): 8 [IU] via SUBCUTANEOUS

## 2023-10-15 MED ORDER — ISOSORBIDE MONONITRATE ER 60 MG PO TB24
120.0000 mg | ORAL_TABLET | Freq: Every day | ORAL | Status: DC
  Administered 2023-10-15 – 2023-10-19 (×5): 120 mg via ORAL
  Filled 2023-10-15 (×5): qty 2

## 2023-10-15 MED ORDER — RANOLAZINE ER 500 MG PO TB12
500.0000 mg | ORAL_TABLET | Freq: Two times a day (BID) | ORAL | Status: DC
  Administered 2023-10-15 – 2023-10-19 (×9): 500 mg via ORAL
  Filled 2023-10-15 (×10): qty 1

## 2023-10-15 MED ORDER — VENLAFAXINE HCL ER 37.5 MG PO CP24
37.5000 mg | ORAL_CAPSULE | Freq: Every day | ORAL | Status: DC
  Filled 2023-10-15: qty 1

## 2023-10-15 MED ORDER — ENOXAPARIN SODIUM 30 MG/0.3ML IJ SOSY
30.0000 mg | PREFILLED_SYRINGE | Freq: Two times a day (BID) | INTRAMUSCULAR | Status: DC
  Administered 2023-10-17 – 2023-10-19 (×5): 30 mg via SUBCUTANEOUS
  Filled 2023-10-15 (×5): qty 0.3

## 2023-10-15 MED ORDER — ONDANSETRON HCL 4 MG/2ML IJ SOLN: 4.0000 mg | Freq: Four times a day (QID) | INTRAMUSCULAR | Status: DC | PRN

## 2023-10-15 MED ORDER — HYDROMORPHONE HCL 1 MG/ML IJ SOLN
0.5000 mg | INTRAMUSCULAR | Status: DC | PRN
  Administered 2023-10-16 (×2): 0.5 mg via INTRAVENOUS
  Filled 2023-10-15 (×2): qty 0.5

## 2023-10-15 MED ORDER — DOCUSATE SODIUM 100 MG PO CAPS
100.0000 mg | ORAL_CAPSULE | Freq: Two times a day (BID) | ORAL | Status: DC
  Administered 2023-10-15 – 2023-10-19 (×9): 100 mg via ORAL
  Filled 2023-10-15 (×11): qty 1

## 2023-10-15 MED ORDER — INSULIN ASPART 100 UNIT/ML IJ SOLN
0.0000 [IU] | Freq: Every day | INTRAMUSCULAR | Status: DC
  Administered 2023-10-18: 3 [IU] via SUBCUTANEOUS

## 2023-10-15 MED ORDER — METOPROLOL TARTRATE 5 MG/5ML IV SOLN: 5.0000 mg | Freq: Four times a day (QID) | INTRAVENOUS | Status: DC | PRN

## 2023-10-15 MED ORDER — ASPIRIN 81 MG PO TBEC
81.0000 mg | DELAYED_RELEASE_TABLET | Freq: Every day | ORAL | Status: DC
Start: 2023-10-15 — End: 2023-10-19
  Administered 2023-10-15 – 2023-10-19 (×5): 81 mg via ORAL
  Filled 2023-10-15 (×5): qty 1

## 2023-10-15 MED ORDER — AMIODARONE HCL 200 MG PO TABS
200.0000 mg | ORAL_TABLET | Freq: Every day | ORAL | Status: DC
  Administered 2023-10-15 – 2023-10-19 (×5): 200 mg via ORAL
  Filled 2023-10-15 (×5): qty 1

## 2023-10-15 MED ORDER — INSULIN ASPART 100 UNIT/ML IJ SOLN
0.0000 [IU] | Freq: Three times a day (TID) | INTRAMUSCULAR | Status: DC
  Administered 2023-10-15 – 2023-10-17 (×6): 1 [IU] via SUBCUTANEOUS
  Administered 2023-10-18 (×3): 5 [IU] via SUBCUTANEOUS
  Administered 2023-10-19: 2 [IU] via SUBCUTANEOUS

## 2023-10-15 MED ORDER — ATORVASTATIN CALCIUM 80 MG PO TABS
80.0000 mg | ORAL_TABLET | Freq: Every day | ORAL | Status: DC
  Administered 2023-10-15 – 2023-10-19 (×5): 80 mg via ORAL
  Filled 2023-10-15 (×5): qty 1

## 2023-10-15 MED ORDER — LEVOTHYROXINE SODIUM 25 MCG PO TABS
125.0000 ug | ORAL_TABLET | Freq: Every day | ORAL | Status: DC
  Administered 2023-10-15 – 2023-10-19 (×5): 125 ug via ORAL
  Filled 2023-10-15 (×6): qty 1

## 2023-10-15 MED ORDER — METHOCARBAMOL 500 MG PO TABS
500.0000 mg | ORAL_TABLET | Freq: Three times a day (TID) | ORAL | Status: AC
  Administered 2023-10-15 – 2023-10-17 (×9): 500 mg via ORAL
  Filled 2023-10-15 (×9): qty 1

## 2023-10-15 MED ORDER — HYDRALAZINE HCL 20 MG/ML IJ SOLN: 10.0000 mg | INTRAMUSCULAR | Status: DC | PRN

## 2023-10-15 MED ORDER — METHOCARBAMOL 1000 MG/10ML IJ SOLN: 500.0000 mg | Freq: Three times a day (TID) | INTRAMUSCULAR | Status: AC

## 2023-10-15 MED ORDER — VENLAFAXINE HCL ER 75 MG PO CP24
75.0000 mg | ORAL_CAPSULE | Freq: Every day | ORAL | Status: DC
  Administered 2023-10-15 – 2023-10-19 (×5): 75 mg via ORAL
  Filled 2023-10-15 (×4): qty 1

## 2023-10-15 MED ORDER — NITROGLYCERIN 0.4 MG SL SUBL: 0.4000 mg | SUBLINGUAL_TABLET | SUBLINGUAL | Status: DC | PRN

## 2023-10-15 MED ORDER — ONDANSETRON 4 MG PO TBDP: 4.0000 mg | ORAL_TABLET | Freq: Four times a day (QID) | ORAL | Status: DC | PRN

## 2023-10-15 MED ORDER — LORATADINE 10 MG PO TABS
10.0000 mg | ORAL_TABLET | Freq: Every evening | ORAL | Status: DC
  Administered 2023-10-15 – 2023-10-18 (×4): 10 mg via ORAL
  Filled 2023-10-15 (×4): qty 1

## 2023-10-15 MED ORDER — PANTOPRAZOLE SODIUM 40 MG PO TBEC
40.0000 mg | DELAYED_RELEASE_TABLET | Freq: Every day | ORAL | Status: DC
  Administered 2023-10-15 – 2023-10-19 (×5): 40 mg via ORAL
  Filled 2023-10-15 (×5): qty 1

## 2023-10-15 NOTE — ED Notes (Signed)
ED TO INPATIENT HANDOFF REPORT  ED Nurse Name and Phone #:  Tavius Turgeon [phone]  S Name/Age/Gender [name] [name] 77 y.o. male Room/Bed: 034C/034C  Code Status   Code Status: Full Code  Home/SNF/Other Home Patient oriented to: self, place, time, and situation Is this baseline? Yes   Triage Complete: Triage complete  Chief Complaint Trauma [T14.90XA]  Triage Note The pt fell earlier tonight 0700 striking his head he is on a bloodd thinner he is c/o lt sided head pain  he was tangeled up in his dog  collar and lost his balance   Allergies Allergies  Allergen Reactions   Morphine Other (See Comments)    Hallucinations   Shellfish-Derived Products Itching    Level of Care/Admitting Diagnosis ED Disposition     ED Disposition  Admit   Condition  --   Comment  Hospital Area: [facility] [[id number]]  Level of Care: Med-Surg [16]  May admit patient to [facility] or [facility] if equivalent level of care is available:: No  Covid Evaluation: Asymptomatic - no recent exposure (last 10 days) testing not required  Diagnosis: Trauma [536644]  Admitting Physician: TRAUMA MD [[id number]]  Attending Physician: TRAUMA MD [[id number]]  Certification:: I certify this patient will need inpatient services for at least 2 midnights  Estimated Length of Stay: 2          B Medical/Surgery History Past Medical History:  Diagnosis Date   Acquired atrophy of thyroid 06/05/2013   Overview:  July 2014: controlled on synthroid since 2012 May 2015: decreased to Synthroid 50 mcg  Last Assessment & Plan:  Pt doing well with most recent TSH within normal range.  Will continue current dosage of Synthroid . Pt reminded to take medication on empty stomach. Will continue to monitor with periodic laboratory assessment in 3 months.    Anemia    hemoglobin 7.4, iron deficiency, January, 2011, 2 unit transfusion, endoscopy normal, capsule endoscopy February, 2011 no small bowel  abnormalities.   Most likely source gastric erosions, followed by GI   Anxiety    Bradycardia    CAD (coronary artery disease)    A. CABG in 2000,status post cardiac cath in 2006, 2009 ....continued chest pain and SOB despite oral medication adjestments including Ranexa. B. Cath November 2009/ mRCA - 2.75 x 23 Abbott Xience V drug-eluting stent ...11/26/2008 to distal  RCA leading to acute marginal.  C. Cath 07/2012 for CP - stable anatomy, med rx. d. cath 2015 and 05/30/2015 stable anatomy, consider Myoview if has CP again   Carotid artery disease (HCC) 08/01/2015   Doppler, May 29, 2015, 1-39% bilateral ICA    Cerebral ischemia    MRI November, 2010, chronic microvascular ischemia   CKD (chronic kidney disease), stage III (HCC)    Concussion    Depression    Bipolar   Diabetes mellitus (HCC)    Edema    Essential hypertension 06/05/2013   Overview:  July 2014: Controlled with Bystolic Sep 2015: Changed to atenolol.  Last Assessment & Plan:  Will change to atenolol for cost and follow Improved.  Medication compliance strongly encouraged BP: 116/64 mmHg    Overview:  July 2014: Controlled with Bystolic Sep 2015: Changed to atenolol.  Last Assessment & Plan:  Will change to atenolol for cost and follow   Falling episodes    these have occurred in the past and again recurring 2011   Family history of adverse reaction to anesthesia    "  mother died during bypass surgery but not sure if it has to do with anesthesia"   Gastric ulcer    GERD (gastroesophageal reflux disease)    H/O medication noncompliance    Due to loss of insurance   H/O multiple concussions    Hard of hearing    Heart murmur    History of blood transfusion 12/20/2013   History of kidney stones    Hx of CABG    2000,  / one median sternotomy suture broken her chest x-ray November, 2010, no clinical significance   Hyperlipidemia    Hypertension    pt. denies   Iron deficiency anemia    Long term (current) use of  anticoagulants [Z79.01] 07/20/2016   Low back pain 06/12/2009   Qualifier: Diagnosis of  By: Nena Jordan    Nephrolithiasis    Orthostasis    OSA (obstructive sleep apnea)    PAF (paroxysmal atrial fibrillation) (HCC)    a. dx 2017, started on amiodarone/warfarin but patient intermittently noncompliant.   PSVT (paroxysmal supraventricular tachycardia) (HCC)    RBBB 07/09/2009   Qualifier: Diagnosis of  By: Myrtis Ser, MD, [facility], Lemmie Evens    RLS (restless legs syndrome) 09/19/2009   Qualifier: Diagnosis of  By: Nena Jordan   Overview:  July 2014: Controlled with Mirapex  Last Assessment & Plan:  Patient is doing well. Will continue current management and follow clinically.   Seizure disorder (HCC) 01/09/2017   Spondylosis    C5-6, C6-7 MRI 2010   Syncope 03/2016   TBI (traumatic brain injury) (HCC) 2019   Thyroid disease    Tubulovillous adenoma of colon 2007   Vitamin D deficiency 05/17/2017   Wears glasses    Past Surgical History:  Procedure Laterality Date   ANTERIOR CERVICAL DECOMP/DISCECTOMY FUSION N/A 05/31/2016   Procedure: ANTERIOR CERVICAL DECOMPRESSION/DISCECTOMY FUSION CERVICAL FIVE-SIX,CERVICAL SIX-SEVEN;  Surgeon: Julio Sicks, MD;  Location: [facility] NEURO ORS;  Service: Neurosurgery;  Laterality: N/A;   CARDIAC CATHETERIZATION N/A 05/30/2015   Procedure: Left Heart Cath and Coronary Angiography;  Surgeon: Marykay Lex, MD;  Location: [facility] INVASIVE CV LAB;  Service: Cardiovascular;  Laterality: N/A;   CATARACT EXTRACTION     COLONOSCOPY     CORONARY ARTERY BYPASS GRAFT     2000   ESOPHAGOGASTRODUODENOSCOPY     LEFT HEART CATH AND CORS/GRAFTS ANGIOGRAPHY N/A 12/15/2017   Procedure: LEFT HEART CATH AND CORS/GRAFTS ANGIOGRAPHY;  Surgeon: Corky Crafts, MD;  Location: [facility] INVASIVE CV LAB;  Service: Cardiovascular;  Laterality: N/A;   LEFT HEART CATHETERIZATION WITH CORONARY/GRAFT ANGIOGRAM N/A 08/01/2012   Procedure: LEFT HEART CATHETERIZATION WITH  Isabel Caprice;  Surgeon: Herby Abraham, MD;  Location: [facility] CATH LAB;  Service: Cardiovascular;  Laterality: N/A;   LEFT HEART CATHETERIZATION WITH CORONARY/GRAFT ANGIOGRAM N/A 01/03/2015   Procedure: LEFT HEART CATHETERIZATION WITH Isabel Caprice;  Surgeon: Runell Gess, MD;  Location: [facility] CATH LAB;  Service: Cardiovascular;  Laterality: N/A;   NASAL SEPTUM SURGERY     UP3   PERCUTANEOUS CORONARY STENT INTERVENTION (PCI-S)  10/06/2008   mRCA PCI  2.75 x 23 Abbott Xience V drug-eluting stent    RIGHT/LEFT HEART CATH AND CORONARY/GRAFT ANGIOGRAPHY N/A 01/07/2020   Procedure: RIGHT/LEFT HEART CATH AND CORONARY/GRAFT ANGIOGRAPHY;  Surgeon: Lyn Records, MD;  Location: [facility] INVASIVE CV LAB;  Service: Cardiovascular;  Laterality: N/A;   TOTAL KNEE ARTHROPLASTY Right 09/22/2021   Procedure: RIGHT TOTAL KNEE ARTHROPLASTY;  Surgeon: Marcene Corning, MD;  Location: [facility] ORS;  Service: Orthopedics;  Laterality: Right;   ULTRASOUND GUIDANCE FOR VASCULAR ACCESS  12/15/2017   Procedure: Ultrasound Guidance For Vascular Access;  Surgeon: Corky Crafts, MD;  Location: [facility] INVASIVE CV LAB;  Service: Cardiovascular;;     A IV Location/Drains/Wounds Patient Lines/Drains/Airways Status     Active Line/Drains/Airways     Name Placement date Placement time Site Days   Peripheral IV 10/14/23 20 G Right Antecubital 10/14/23  2003  Antecubital  1            Intake/Output Last 24 hours No intake or output data in the 24 hours ending 10/15/23 0044  Labs/Imaging Results for orders placed or performed during the hospital encounter of 10/14/23 (from the past 48 hour(s))  CBC with Differential     Status: None   Collection Time: 10/14/23  7:49 PM  Result Value Ref Range   WBC 9.2 4.0 - 10.5 K/uL   RBC 4.47 4.22 - 5.81 MIL/uL   Hemoglobin 13.9 13.0 - 17.0 g/dL   HCT 69.6 29.5 - 28.4 %   MCV 88.8 80.0 - 100.0 fL   MCH 31.1 26.0 - 34.0 pg   MCHC 35.0 30.0 - 36.0 g/dL   RDW  13.2 44.0 - 10.2 %   Platelets 172 150 - 400 K/uL   nRBC 0.0 0.0 - 0.2 %   Neutrophils Relative % 75 %   Neutro Abs 6.9 1.7 - 7.7 K/uL   Lymphocytes Relative 15 %   Lymphs Abs 1.4 0.7 - 4.0 K/uL   Monocytes Relative 7 %   Monocytes Absolute 0.6 0.1 - 1.0 K/uL   Eosinophils Relative 2 %   Eosinophils Absolute 0.2 0.0 - 0.5 K/uL   Basophils Relative 0 %   Basophils Absolute 0.0 0.0 - 0.1 K/uL   Immature Granulocytes 1 %   Abs Immature Granulocytes 0.06 0.00 - 0.07 K/uL    Comment: Performed at [facility], [address] [place], [place] [address]  Comprehensive metabolic panel     Status: Abnormal   Collection Time: 10/14/23  7:49 PM  Result Value Ref Range   Sodium 137 135 - 145 mmol/L   Potassium 4.1 3.5 - 5.1 mmol/L   Chloride 103 98 - 111 mmol/L   CO2 23 22 - 32 mmol/L   Glucose, Bld 166 (H) 70 - 99 mg/dL    Comment: Glucose reference range applies only to samples taken after fasting for at least 8 hours.   BUN 19 8 - 23 mg/dL   Creatinine, Ser 6.44 0.61 - 1.24 mg/dL   Calcium 9.3 8.9 - 03.4 mg/dL   Total Protein 6.5 6.5 - 8.1 g/dL   Albumin 3.8 3.5 - 5.0 g/dL   AST 44 (H) 15 - 41 U/L   ALT 53 (H) 0 - 44 U/L   Alkaline Phosphatase 77 38 - 126 U/L   Total Bilirubin 0.7 <1.2 mg/dL   GFR, Estimated >74 >25 mL/min    Comment: (NOTE) Calculated using the CKD-EPI Creatinine Equation (2021)    Anion gap 11 5 - 15    Comment: Performed at [facility], [address] [place], [place] [address]  Troponin I (High Sensitivity)     Status: None   Collection Time: 10/14/23  7:49 PM  Result Value Ref Range   Troponin I (High Sensitivity) 11 <18 ng/L    Comment: (NOTE) Elevated high sensitivity troponin I (hsTnI) values and significant  changes across serial measurements  may suggest ACS but many other  chronic and acute conditions are known to elevate hsTnI results.  Refer to the "Links" section for chest pain algorithms and additional  guidance. Performed at  [facility], [address], [place], [place] [address]   I-stat chem 8, ED (not at [facility], [facility] or [facility])     Status: Abnormal   Collection Time: 10/14/23  8:10 PM  Result Value Ref Range   Sodium 137 135 - 145 mmol/L   Potassium 3.9 3.5 - 5.1 mmol/L   Chloride 102 98 - 111 mmol/L   BUN 21 8 - 23 mg/dL   Creatinine, Ser 4.25 (H) 0.61 - 1.24 mg/dL   Glucose, Bld 956 (H) 70 - 99 mg/dL    Comment: Glucose reference range applies only to samples taken after fasting for at least 8 hours.   Calcium, Ion 1.11 (L) 1.15 - 1.40 mmol/L   TCO2 23 22 - 32 mmol/L   Hemoglobin 13.3 13.0 - 17.0 g/dL   HCT 38.7 56.4 - 33.2 %   *Note: Due to a large number of results and/or encounters for the requested time period, some results have not been displayed. A complete set of results can be found in Results Review.   CT Head Wo Contrast  Result Date: 10/14/2023 CLINICAL DATA:  Head trauma, moderate-severe Fall on thinners; Polytrauma, blunt striking his head he is on a bloodd thinner he is c/o lt sided head pain he was tangeled up in his dog collar and lost his balance EXAM: CT HEAD WITHOUT CONTRAST CT CERVICAL SPINE WITHOUT CONTRAST TECHNIQUE: Multidetector CT imaging of the head and cervical spine was performed following the standard protocol without intravenous contrast. Multiplanar CT image reconstructions of the cervical spine were also generated. RADIATION DOSE REDUCTION: This exam was performed according to the departmental dose-optimization program which includes automated exposure control, adjustment of the mA and/or kV according to patient size and/or use of iterative reconstruction technique. COMPARISON:  None Available. FINDINGS: CT HEAD FINDINGS Brain: Cerebral ventricle sizes are concordant with the degree of cerebral volume loss. Patchy and confluent areas of decreased attenuation are noted throughout the deep and periventricular white matter of the cerebral hemispheres bilaterally, compatible with  chronic microvascular ischemic disease. No evidence of large-territorial acute infarction. No parenchymal hemorrhage. No mass lesion. No extra-axial collection. No mass effect or midline shift. No hydrocephalus. Basilar cisterns are patent. Vascular: No hyperdense vessel. Atherosclerotic calcifications are present within the cavernous internal carotid and vertebral arteries. Skull: No acute fracture or focal lesion. Sinuses/Orbits: Left sphenoid sinus mucosal thickening. Other paranasal sinuses and mastoid air cells are clear. Bilateral lens replacement. Otherwise the orbits are unremarkable. Other: None. CT CERVICAL SPINE FINDINGS Alignment: Normal. Skull base and vertebrae: C5-C7 anterior cervical discectomy and fusion. Multilevel moderate degenerative changes spine. No associated severe osseous neural foraminal or central canal stenosis. No acute fracture. No aggressive appearing focal osseous lesion or focal pathologic process. Soft tissues and spinal canal: No prevertebral fluid or swelling. No visible canal hematoma. Upper chest: Trace to small volume left pneumohemothorax. Other: None. IMPRESSION: 1. No acute intracranial abnormality. 2. No acute displaced fracture or traumatic listhesis of the cervical spine. 3. Trace to small volume left apical pneumothorax. These results were called by telephone at the time of interpretation on 10/14/2023 at 9:43 pm to provider Dr. Theresia Lo, who verbally acknowledged these results. Electronically Signed   By: Tish Frederickson [name]   On: 10/14/2023 21:56   CT Cervical Spine  Wo Contrast  Result Date: 10/14/2023 CLINICAL DATA:  Head trauma, moderate-severe Fall on thinners; Polytrauma, blunt striking his head he is on a bloodd thinner he is c/o lt sided head pain he was tangeled up in his dog collar and lost his balance EXAM: CT HEAD WITHOUT CONTRAST CT CERVICAL SPINE WITHOUT CONTRAST TECHNIQUE: Multidetector CT imaging of the head and cervical spine was performed following  the standard protocol without intravenous contrast. Multiplanar CT image reconstructions of the cervical spine were also generated. RADIATION DOSE REDUCTION: This exam was performed according to the departmental dose-optimization program which includes automated exposure control, adjustment of the mA and/or kV according to patient size and/or use of iterative reconstruction technique. COMPARISON:  None Available. FINDINGS: CT HEAD FINDINGS Brain: Cerebral ventricle sizes are concordant with the degree of cerebral volume loss. Patchy and confluent areas of decreased attenuation are noted throughout the deep and periventricular white matter of the cerebral hemispheres bilaterally, compatible with chronic microvascular ischemic disease. No evidence of large-territorial acute infarction. No parenchymal hemorrhage. No mass lesion. No extra-axial collection. No mass effect or midline shift. No hydrocephalus. Basilar cisterns are patent. Vascular: No hyperdense vessel. Atherosclerotic calcifications are present within the cavernous internal carotid and vertebral arteries. Skull: No acute fracture or focal lesion. Sinuses/Orbits: Left sphenoid sinus mucosal thickening. Other paranasal sinuses and mastoid air cells are clear. Bilateral lens replacement. Otherwise the orbits are unremarkable. Other: None. CT CERVICAL SPINE FINDINGS Alignment: Normal. Skull base and vertebrae: C5-C7 anterior cervical discectomy and fusion. Multilevel moderate degenerative changes spine. No associated severe osseous neural foraminal or central canal stenosis. No acute fracture. No aggressive appearing focal osseous lesion or focal pathologic process. Soft tissues and spinal canal: No prevertebral fluid or swelling. No visible canal hematoma. Upper chest: Trace to small volume left pneumohemothorax. Other: None. IMPRESSION: 1. No acute intracranial abnormality. 2. No acute displaced fracture or traumatic listhesis of the cervical spine. 3. Trace  to small volume left apical pneumothorax. These results were called by telephone at the time of interpretation on 10/14/2023 at 9:43 pm to provider Dr. Theresia Lo, who verbally acknowledged these results. Electronically Signed   By: Tish Frederickson [name]   On: 10/14/2023 21:56   CT CHEST ABDOMEN PELVIS W CONTRAST  Result Date: 10/14/2023 CLINICAL DATA:  Polytrauma, blunt.  Fall EXAM: CT CHEST, ABDOMEN, AND PELVIS WITH CONTRAST TECHNIQUE: Multidetector CT imaging of the chest, abdomen and pelvis was performed following the standard protocol during bolus administration of intravenous contrast. RADIATION DOSE REDUCTION: This exam was performed according to the departmental dose-optimization program which includes automated exposure control, adjustment of the mA and/or kV according to patient size and/or use of iterative reconstruction technique. CONTRAST:  75mL OMNIPAQUE IOHEXOL 350 MG/ML SOLN COMPARISON:  None Available. FINDINGS: CHEST: Cardiovascular: No aortic injury. The thoracic aorta is normal in caliber. The heart is normal in size. No significant pericardial effusion. Atherosclerotic plaque. Four-vessel coronary calcifications status post coronary artery bypass graft. Mediastinum/Nodes: No pneumomediastinum. No mediastinal hematoma. The esophagus is unremarkable. The thyroid is unremarkable. The central airways are patent. No mediastinal, hilar, or axillary lymphadenopathy. Lungs/Pleura: Bilateral lower lobe and lingular atelectasis. No focal consolidation. No pulmonary nodule. No pulmonary mass. No definite pulmonary contusion or laceration. No pneumatocele formation. No pleural effusion. No pneumothorax. No hemothorax. Musculoskeletal/Chest wall: No chest wall mass. No acute rib or sternal fracture. No spinal fracture. ABDOMEN / PELVIS: Hepatobiliary: Not enlarged. No focal lesion. No laceration or subcapsular hematoma. The gallbladder is otherwise unremarkable with no radio-opaque  gallstones. Contracted  gallbladder. No biliary ductal dilatation. Pancreas: Normal pancreatic contour. No main pancreatic duct dilatation. Spleen: Splenule is enlarged measuring up to 13.5 cm. No focal lesion. No laceration, subcapsular hematoma, or vascular injury. Adrenals/Urinary Tract: No nodularity bilaterally. Bilateral kidneys enhance symmetrically. No hydronephrosis. No contusion, laceration, or subcapsular hematoma. No injury to the vascular structures or collecting systems. No hydroureter. The urinary bladder is unremarkable. Stomach/Bowel: No small or large bowel wall thickening or dilatation. The appendix is unremarkable. Vasculature/Lymphatics: At least moderate atherosclerotic plaque. No abdominal aorta or iliac aneurysm. No active contrast extravasation or pseudoaneurysm. No abdominal, pelvic, inguinal lymphadenopathy. Reproductive: The prostate is enlarged measuring up to 4.9 cm. Other: No simple free fluid ascites. No pneumoperitoneum. No hemoperitoneum. No mesenteric hematoma identified. No organized fluid collection. Musculoskeletal: No significant soft tissue hematoma. Anterior abdominal wall subcutaneus soft tissue edema likely due to medication injection (3:89). No acute pelvic fracture. No spinal fracture. Ports and Devices: None. IMPRESSION: 1. At least small volume left hemopneumothorax. 2. Acute displaced left lateral fifth and sixth rib fractures. 3. Bilateral lower lobe and lingular atelectasis with superimposed developing contusions not excluded. 4.  No acute intra-abdominal, intrapelvic traumatic injury. 5. No acute fracture or traumatic malalignment of the thoracic or lumbar spine. 6. Other imaging findings of potential clinical significance: Mild splenomegaly. Prostatomegaly. Aortic Atherosclerosis (ICD10-I70.0). Electronically Signed   By: Tish Frederickson [name]   On: 10/14/2023 21:52   DG Pelvis Portable  Result Date: 10/14/2023 CLINICAL DATA:  fall EXAM: PORTABLE PELVIS 1-2 VIEWS COMPARISON:  CT  abdomen pelvis 10/14/2023 FINDINGS: Limited evaluation due to overlapping osseous structures and overlying soft tissues. There is no evidence of pelvic fracture or diastasis. No acute displaced fracture or dislocation of either hips. No pelvic bone lesions are seen. IMPRESSION: Negative for acute traumatic injury. Electronically Signed   By: Tish Frederickson [name]   On: 10/14/2023 21:37   DG Chest Portable 1 View  Result Date: 10/14/2023 CLINICAL DATA:  fall EXAM: PORTABLE CHEST 1 VIEW COMPARISON:  CT C-spine 10/14/2023, chest x-ray 01/05/2023 FINDINGS: The heart and mediastinal contours are within normal limits. Vascular clips overlie the mediastinum. No focal consolidation. No pulmonary edema. No pleural effusion. Trace to small volume left apical pneumothorax. Trace left pleural fluid. No right pneumothorax. No right pleural effusion. Acute displaced lateral sixth rib fracture. Sternotomy wires are intact.  Cervical spine surgical hardware. IMPRESSION: 1. Small volume left hydropneumothorax. 2. Acute displaced lateral sixth rib fracture. Electronically Signed   By: Tish Frederickson [name]   On: 10/14/2023 21:36    Pending Labs Unresulted Labs (From admission, onward)     Start     Ordered   10/22/23 0500  Creatinine, serum  (enoxaparin (LOVENOX)  - Medium risk, CrCl >/= 30 )  Weekly,   R     Comments: while on enoxaparin therapy.    10/15/23 0031   10/15/23 0500  CBC  Daily,   R      10/15/23 0031   10/15/23 0500  Basic metabolic panel  Daily,   R      10/15/23 0031   10/15/23 0029  CBC  (enoxaparin (LOVENOX)  - Medium risk, CrCl >/= 30 )  Once,   R       Comments: Baseline for enoxaparin therapy IF NOT already drawn.  Notify MD if PLT < 100 K.    10/15/23 0031   10/15/23 0029  Creatinine, serum  (enoxaparin (LOVENOX)  - Medium risk, CrCl >/= 30 )  Once,   R       Comments: Baseline for enoxaparin therapy IF NOT already drawn.    10/15/23 0031            Vitals/Pain Today's Vitals    10/14/23 2215 10/14/23 2230 10/14/23 2245 10/14/23 2300  BP: 127/62 (!) 136/57 (!) 140/60 132/67  Pulse: 69 68 67 68  Resp: (!) 21 13 16 17   Temp:      TempSrc:      SpO2: 100% 99% 100% 99%  Weight:      Height:      PainSc:        Isolation Precautions No active isolations  Medications Medications  acetaminophen (TYLENOL) tablet 1,000 mg (has no administration in time range)  methocarbamol (ROBAXIN) tablet 500 mg (has no administration in time range)    Or  methocarbamol (ROBAXIN) injection 500 mg (has no administration in time range)  docusate sodium (COLACE) capsule 100 mg (has no administration in time range)  polyethylene glycol (MIRALAX / GLYCOLAX) packet 17 g (has no administration in time range)  ondansetron (ZOFRAN-ODT) disintegrating tablet 4 mg (has no administration in time range)    Or  ondansetron (ZOFRAN) injection 4 mg (has no administration in time range)  metoprolol tartrate (LOPRESSOR) injection 5 mg (has no administration in time range)  hydrALAZINE (APRESOLINE) injection 10 mg (has no administration in time range)  enoxaparin (LOVENOX) injection 30 mg (has no administration in time range)  oxyCODONE (Oxy IR/ROXICODONE) immediate release tablet 5 mg (has no administration in time range)  HYDROmorphone (DILAUDID) injection 0.5 mg (has no administration in time range)  amiodarone (PACERONE) tablet 200 mg (has no administration in time range)  aspirin EC tablet 81 mg (has no administration in time range)  atorvastatin (LIPITOR) tablet 80 mg (has no administration in time range)  loratadine (CLARITIN) tablet 10 mg (has no administration in time range)  levothyroxine (SYNTHROID) tablet 125 mcg (has no administration in time range)  memantine (NAMENDA) tablet 10 mg (has no administration in time range)  pantoprazole (PROTONIX) EC tablet 40 mg (has no administration in time range)  ranolazine (RANEXA) 12 hr tablet 500 mg (has no administration in time range)   venlafaxine XR (EFFEXOR-XR) 24 hr capsule 37.5 mg (has no administration in time range)  insulin aspart (novoLOG) injection 0-9 Units (has no administration in time range)  insulin aspart (novoLOG) injection 0-5 Units (has no administration in time range)  insulin aspart (novoLOG) injection 3 Units (has no administration in time range)  iohexol (OMNIPAQUE) 350 MG/ML injection 75 mL (75 mLs Intravenous Contrast Given 10/14/23 2036)  ondansetron (ZOFRAN) injection 4 mg (4 mg Intravenous Given 10/14/23 2133)  fentaNYL (SUBLIMAZE) injection 25 mcg (25 mcg Intravenous Given 10/14/23 2134)  fentaNYL (SUBLIMAZE) injection 50 mcg (50 mcg Intravenous Given 10/14/23 2309)    Mobility walks        R Recommendations: See Admitting Provider Note  Report given to:   Additional Notes:  Pt is A&Ox4, continent x2, ambulatory at baseline. Wearing Non re breather

## 2023-10-15 NOTE — TOC CAGE-AID Note (Signed)
Transition of Care [facility]) - CAGE-AID Screening   Patient Details  Name: [name] [name] MRN: [id number] Date of Birth: 05-Mar-1946  Hewitt Shorts, RN Trauma Response Nurse Phone Number: [phone] 10/15/2023, 10:43 AM    CAGE-AID Screening:    Have You Ever Felt You Ought to Cut Down on Your Drinking or Drug Use?: No Have People Annoyed You By [employer] Your Drinking Or Drug Use?: No Have You Felt Bad Or Guilty About Your Drinking Or Drug Use?: No Have You Ever Had a Drink or Used Drugs First Thing In The Morning to Steady Your Nerves or to Get Rid of a Hangover?: No CAGE-AID Score: 0  Substance Abuse Education Offered: No (denies alcohol or drug use)

## 2023-10-15 NOTE — Inpatient Diabetes Management (Signed)
Inpatient Diabetes Program Recommendations  AACE/ADA: New Consensus Statement on Inpatient Glycemic Control (2015)  Target Ranges:  Prepandial:   less than 140 mg/dL      Peak postprandial:   less than 180 mg/dL (1-2 hours)      Critically ill patients:  140 - 180 mg/dL   Lab Results  Component Value Date   GLUCAP 128 (H) 10/15/2023   HGBA1C 10.3 (H) 09/01/2023    Review of Glycemic Control  Latest Reference Range & Units 10/15/23 01:36 10/15/23 02:12 10/15/23 09:28 10/15/23 12:00  Glucose-Capillary 70 - 99 mg/dL 63 (L) 161 (H) 096 (H) 128 (H)   Diabetes history: DM 2 Outpatient Diabetes medications: Toujeo 60 units, Humalog 12 units tid, Farxiga 10 mg Daily Current orders for Inpatient glycemic control:  Farxiga 10 mg Daily Novolog 0-9 units tid + hs + 8 units tid meal coverage  A1c 10.3% on 9/26 at PCP visit, pt has had many follow up visits for medication titration and DM follow up since A1c obtained.  Pt follow up visits 10/3 10/8 10/11   Since pt has challenges with memory due to Dementia and past hx of TBI pt PCP and pharmacist helping with DM management is working with pt and family to ensure alarms and regimen is able to be taken as prescribed.   Will follow glucose trends over the next 24 hours and determine if adjustments need to be made for d/c. However, pt has very good follow up with PCP and this can be deferred to them.  Thanks,  Christena Deem RN, MSN, BC-ADM Inpatient Diabetes Coordinator Team Pager [phone] (8a-5p)

## 2023-10-15 NOTE — Evaluation (Signed)
Occupational Therapy Evaluation Patient Details Name: [name] [name] MRN: [id number] DOB: [birth year] Today's Date: 10/15/2023   History of Present Illness 77 y/o M presented to the ED 11/8 after he sustained a mechanical fall. CT imaging shows left 5/6th rib fractures with small hemopneumothorax. PMHx: hypothyroidism, CAD s/p CABG and DES, CKD, Depression, DM, HTN, GERD, OSA, pAFib (on Eliquis), and nephrolithiasis.   Clinical Impression   Pt typically lives alone with 3 dogs. Reports frequent falls, but independent in ADL and mobility, does not drive. Daughter brings groceries weekly (she has been trying to get him to move in with her). Today Pt is slow and stedy and overall close guard for transfers with RW and standing grooming tasks at sink. Pt requires mod A for LB ADL due to pain with bending as Pt is unable to perform figure 4 at this time. Pt will benefit from skilled OT in the acute setting and next session should plan on AE demonstration and practice for LB ADL as well as continued OOB transfers and activities. Do not anticipate need for post-acute OT at this time if he can use AE to address LB.      If plan is discharge home, recommend the following: A little help with walking and/or transfers;A lot of help with bathing/dressing/bathroom;Assistance with cooking/housework;Assist for transportation;Help with stairs or ramp for entrance    Functional Status Assessment  Patient has had a recent decline in their functional status and demonstrates the ability to make significant improvements in function in a reasonable and predictable amount of time.  Equipment Recommendations  BSC/3in1    Recommendations for Other Services PT consult     Precautions / Restrictions Precautions Precautions: Fall (left rib fx) Restrictions Weight Bearing Restrictions: No      Mobility Bed Mobility               General bed mobility comments: in recliner at beginning and end of session     Transfers Overall transfer level: Needs assistance Equipment used: Rolling walker (2 wheels) Transfers: Sit to/from Stand Sit to Stand: Contact guard assist           General transfer comment: Slow and effortful, cues for hand placement to minimize discomfort and maximize lift. CGA for safety, RW for support and balance      Balance Overall balance assessment: Needs assistance Sitting-balance support: No upper extremity supported, Feet supported Sitting balance-Leahy Scale: Fair     Standing balance support: Bilateral upper extremity supported, Reliant on assistive device for balance Standing balance-Leahy Scale: Poor                             ADL either performed or assessed with clinical judgement   ADL Overall ADL's : Needs assistance/impaired Eating/Feeding: Modified independent;Sitting   Grooming: Wash/dry hands;Wash/dry face;Oral care;Contact guard assist;Standing Grooming Details (indicate cue type and reason): sink level Upper Body Bathing: Moderate assistance Upper Body Bathing Details (indicate cue type and reason): for back Lower Body Bathing: Moderate assistance Lower Body Bathing Details (indicate cue type and reason): knees down, pain with bending Upper Body Dressing : Minimal assistance;Sitting Upper Body Dressing Details (indicate cue type and reason): extra gown like robe Lower Body Dressing: Moderate assistance Lower Body Dressing Details (indicate cue type and reason): unable to perform figure 4, pain with bending Toilet Transfer: Contact guard assist;Ambulation;Rollator (4 wheels);Minimal assistance Toilet Transfer Details (indicate cue type and reason): ambulated into bathroom Toileting- Clothing Manipulation  and Hygiene: Contact guard assist;Sitting/lateral lean       Functional mobility during ADLs: Contact guard assist;Cueing for safety;Rolling walker (2 wheels) General ADL Comments: decreased access to LB for ADL, decreased  activity tolerance, decreased balance     Vision Patient Visual Report: No change from baseline Vision Assessment?: No apparent visual deficits     Perception         Praxis         Pertinent Vitals/Pain Pain Assessment Pain Assessment: Faces Faces Pain Scale: Hurts whole lot Pain Location: Lt ribs with movement. Pain Descriptors / Indicators: Aching Pain Intervention(s): Monitored during session, Repositioned, Patient requesting pain meds-RN notified     Extremity/Trunk Assessment Upper Extremity Assessment Upper Extremity Assessment: Generalized weakness       Cervical / Trunk Assessment Cervical / Trunk Assessment: Other exceptions Cervical / Trunk Exceptions: rib fx   Communication Communication Communication: No apparent difficulties   Cognition Arousal: Alert Behavior During Therapy: WFL for tasks assessed/performed Overall Cognitive Status: No family/caregiver present to determine baseline cognitive functioning Area of Impairment: Safety/judgement                         Safety/Judgement: Decreased awareness of safety     General Comments: safety awareness vs want to be independent     General Comments  VSS on RA throughout session    Exercises     Shoulder Instructions      Home Living Family/patient expects to be discharged to:: Private residence Living Arrangements: Alone Available Help at Discharge: Family;Available PRN/intermittently;Neighbor Type of Home: House Home Access: Stairs to enter [employer] of Steps: 4+1 Entrance Stairs-Rails: Right Home Layout: One level     Bathroom Shower/Tub: Chief Strategy Officer: Standard Bathroom Accessibility: Yes   Home Equipment: Agricultural consultant (2 wheels);Cane - single point   Additional Comments: Plans to stay at daughter's house upon d/c. States no stairs, they can help him as needed. "they have been trying to get me to move in with them for a while." has 3  dogs.      Prior Functioning/Environment Prior Level of Function : Independent/Modified Independent;History of Falls (last six months)             Mobility Comments: no device used inside, reports multiple falls. uses RW when he leaves the house ADLs Comments: no longer driving. Daughter brings groceries once per week. Able to bathe, dress, and cook for himself. Has 3 dogs, lets them outside but does not walk them.        OT Problem List: Decreased strength;Decreased activity tolerance;Impaired balance (sitting and/or standing);Decreased range of motion;Decreased safety awareness;Decreased knowledge of use of DME or AE;Decreased knowledge of precautions;Pain      OT Treatment/Interventions: Self-care/ADL training;DME and/or AE instruction;Energy conservation;Therapeutic activities;Patient/family education;Balance training    OT Goals(Current goals can be found in the care plan section) Acute Rehab OT Goals Patient Stated Goal: walk easier again, get back to dogs OT Goal Formulation: With patient Time For Goal Achievement: 10/29/23 Potential to Achieve Goals: Good ADL Goals Pt Will Perform Grooming: with modified independence;standing Pt Will Perform Upper Body Dressing: with modified independence;sitting Pt Will Perform Lower Body Dressing: with modified independence;with adaptive equipment;sit to/from stand Pt Will Transfer to Toilet: with modified independence;ambulating Pt Will Perform Toileting - Clothing Manipulation and hygiene: with modified independence;sit to/from stand Additional ADL Goal #1: Pt will verbalize at least 3 energy conservation strategies for ADL with no  cues  OT Frequency: Min 1X/week    Co-evaluation              AM-PAC OT "6 Clicks" Daily Activity     Outcome Measure Help from another person eating meals?: None Help from another person taking care of personal grooming?: A Little Help from another person toileting, which includes using toliet,  bedpan, or urinal?: A Little Help from another person bathing (including washing, rinsing, drying)?: A Lot Help from another person to put on and taking off regular upper body clothing?: A Little Help from another person to put on and taking off regular lower body clothing?: A Lot 6 Click Score: 17   End of Session Equipment Utilized During Treatment: Gait belt;Rolling walker (2 wheels) Nurse Communication: Mobility status;Precautions  Activity Tolerance: Patient tolerated treatment well Patient left: in chair;with call bell/phone within reach;with chair alarm set  OT Visit Diagnosis: Unsteadiness on feet (R26.81);Other abnormalities of gait and mobility (R26.89);Muscle weakness (generalized) (M62.81);History of falling (Z91.81);Repeated falls (R29.6)                Time: 1610-9604 OT Time Calculation (min): 28 min Charges:  OT General Charges $OT Visit: 1 Visit OT Evaluation $OT Eval Moderate Complexity: 1 Mod OT Treatments $Self Care/Home Management : 8-22 mins  Nyoka Cowden OTR/L Acute Rehabilitation Services Office: [phone]   Evern Bio [facility] 10/15/2023, 5:41 PM

## 2023-10-15 NOTE — ED Notes (Signed)
Pt placed on non-re breather 02 mask per providers orders.

## 2023-10-15 NOTE — Progress Notes (Signed)
Subjective/Chief Complaint: Patient still very sore, and pain is severe when he moves.  Has been doing his IS.  Is working on getting OOB. No SOB.     Objective: Vital signs in last 24 hours: Temp:  [97.6 F (36.4 C)-98.2 F (36.8 C)] 97.6 F (36.4 C) (11/09 0741) Pulse Rate:  [57-75] 57 (11/09 0741) Resp:  [12-23] 16 (11/09 0741) BP: (122-145)/(55-67) 122/60 (11/09 0741) SpO2:  [96 %-100 %] 99 % (11/09 0741) Weight:  [75.8 kg] 75.8 kg (11/08 1941) Last BM Date : 10/14/23  Intake/Output from previous day: 11/08 0701 - 11/09 0700 In: 360 [P.O.:360] Out: 325 [Urine:325] Intake/Output this shift: No intake/output data recorded.  General appearance: alert, cooperative, no distress, and ambulating with walker Resp: having some splinting with deep breathing. Pulling 1000 on IS GI: soft, non tender, sl protuberant.   Lab Results:  Recent Labs    10/15/23 0126 10/15/23 0603  WBC 10.0 8.8  HGB 13.6 13.2  HCT 39.3 37.4*  PLT 156 144*   BMET Recent Labs    10/14/23 1949 10/14/23 2010 10/15/23 0126 10/15/23 0603  NA 137 137  --  137  K 4.1 3.9  --  3.7  CL 103 102  --  104  CO2 23  --   --  27  GLUCOSE 166* 159*  --  200*  BUN 19 21  --  16  CREATININE 1.23 1.30* 1.20 1.37*  CALCIUM 9.3  --   --  8.8*   PT/INR No results for input(s): "LABPROT", "INR" in the last 72 hours. ABG No results for input(s): "PHART", "HCO3" in the last 72 hours.  Invalid input(s): "PCO2", "PO2"  Studies/Results: DG CHEST PORT 1 VIEW  Result Date: 10/15/2023 CLINICAL DATA:  Pneumothorax. EXAM: PORTABLE CHEST 1 VIEW COMPARISON:  October 14, 2023. FINDINGS: Stable cardiomediastinal silhouette. Status post coronary bypass graft. Stable small left apical pneumothorax. Left basilar subsegmental atelectasis and small effusion remains. Bony thorax is unremarkable. IMPRESSION: Stable small left apical pneumothorax. Mild left basilar subsegmental atelectasis and small pleural effusion is  noted. Electronically Signed   By: Lupita Raider [name]   On: 10/15/2023 08:17   CT Head Wo Contrast  Result Date: 10/14/2023 CLINICAL DATA:  Head trauma, moderate-severe Fall on thinners; Polytrauma, blunt striking his head he is on a bloodd thinner he is c/o lt sided head pain he was tangeled up in his dog collar and lost his balance EXAM: CT HEAD WITHOUT CONTRAST CT CERVICAL SPINE WITHOUT CONTRAST TECHNIQUE: Multidetector CT imaging of the head and cervical spine was performed following the standard protocol without intravenous contrast. Multiplanar CT image reconstructions of the cervical spine were also generated. RADIATION DOSE REDUCTION: This exam was performed according to the departmental dose-optimization program which includes automated exposure control, adjustment of the mA and/or kV according to patient size and/or use of iterative reconstruction technique. COMPARISON:  None Available. FINDINGS: CT HEAD FINDINGS Brain: Cerebral ventricle sizes are concordant with the degree of cerebral volume loss. Patchy and confluent areas of decreased attenuation are noted throughout the deep and periventricular white matter of the cerebral hemispheres bilaterally, compatible with chronic microvascular ischemic disease. No evidence of large-territorial acute infarction. No parenchymal hemorrhage. No mass lesion. No extra-axial collection. No mass effect or midline shift. No hydrocephalus. Basilar cisterns are patent. Vascular: No hyperdense vessel. Atherosclerotic calcifications are present within the cavernous internal carotid and vertebral arteries. Skull: No acute fracture or focal lesion. Sinuses/Orbits: Left sphenoid sinus mucosal thickening. Other  paranasal sinuses and mastoid air cells are clear. Bilateral lens replacement. Otherwise the orbits are unremarkable. Other: None. CT CERVICAL SPINE FINDINGS Alignment: Normal. Skull base and vertebrae: C5-C7 anterior cervical discectomy and fusion. Multilevel  moderate degenerative changes spine. No associated severe osseous neural foraminal or central canal stenosis. No acute fracture. No aggressive appearing focal osseous lesion or focal pathologic process. Soft tissues and spinal canal: No prevertebral fluid or swelling. No visible canal hematoma. Upper chest: Trace to small volume left pneumohemothorax. Other: None. IMPRESSION: 1. No acute intracranial abnormality. 2. No acute displaced fracture or traumatic listhesis of the cervical spine. 3. Trace to small volume left apical pneumothorax. These results were called by telephone at the time of interpretation on 10/14/2023 at 9:43 pm to provider Dr. Theresia Lo, who verbally acknowledged these results. Electronically Signed   By: Tish Frederickson [name]   On: 10/14/2023 21:56   CT Cervical Spine Wo Contrast  Result Date: 10/14/2023 CLINICAL DATA:  Head trauma, moderate-severe Fall on thinners; Polytrauma, blunt striking his head he is on a bloodd thinner he is c/o lt sided head pain he was tangeled up in his dog collar and lost his balance EXAM: CT HEAD WITHOUT CONTRAST CT CERVICAL SPINE WITHOUT CONTRAST TECHNIQUE: Multidetector CT imaging of the head and cervical spine was performed following the standard protocol without intravenous contrast. Multiplanar CT image reconstructions of the cervical spine were also generated. RADIATION DOSE REDUCTION: This exam was performed according to the departmental dose-optimization program which includes automated exposure control, adjustment of the mA and/or kV according to patient size and/or use of iterative reconstruction technique. COMPARISON:  None Available. FINDINGS: CT HEAD FINDINGS Brain: Cerebral ventricle sizes are concordant with the degree of cerebral volume loss. Patchy and confluent areas of decreased attenuation are noted throughout the deep and periventricular white matter of the cerebral hemispheres bilaterally, compatible with chronic microvascular ischemic  disease. No evidence of large-territorial acute infarction. No parenchymal hemorrhage. No mass lesion. No extra-axial collection. No mass effect or midline shift. No hydrocephalus. Basilar cisterns are patent. Vascular: No hyperdense vessel. Atherosclerotic calcifications are present within the cavernous internal carotid and vertebral arteries. Skull: No acute fracture or focal lesion. Sinuses/Orbits: Left sphenoid sinus mucosal thickening. Other paranasal sinuses and mastoid air cells are clear. Bilateral lens replacement. Otherwise the orbits are unremarkable. Other: None. CT CERVICAL SPINE FINDINGS Alignment: Normal. Skull base and vertebrae: C5-C7 anterior cervical discectomy and fusion. Multilevel moderate degenerative changes spine. No associated severe osseous neural foraminal or central canal stenosis. No acute fracture. No aggressive appearing focal osseous lesion or focal pathologic process. Soft tissues and spinal canal: No prevertebral fluid or swelling. No visible canal hematoma. Upper chest: Trace to small volume left pneumohemothorax. Other: None. IMPRESSION: 1. No acute intracranial abnormality. 2. No acute displaced fracture or traumatic listhesis of the cervical spine. 3. Trace to small volume left apical pneumothorax. These results were called by telephone at the time of interpretation on 10/14/2023 at 9:43 pm to provider Dr. Theresia Lo, who verbally acknowledged these results. Electronically Signed   By: Tish Frederickson [name]   On: 10/14/2023 21:56   CT CHEST ABDOMEN PELVIS W CONTRAST  Result Date: 10/14/2023 CLINICAL DATA:  Polytrauma, blunt.  Fall EXAM: CT CHEST, ABDOMEN, AND PELVIS WITH CONTRAST TECHNIQUE: Multidetector CT imaging of the chest, abdomen and pelvis was performed following the standard protocol during bolus administration of intravenous contrast. RADIATION DOSE REDUCTION: This exam was performed according to the departmental dose-optimization program which includes automated  exposure  control, adjustment of the mA and/or kV according to patient size and/or use of iterative reconstruction technique. CONTRAST:  75mL OMNIPAQUE IOHEXOL 350 MG/ML SOLN COMPARISON:  None Available. FINDINGS: CHEST: Cardiovascular: No aortic injury. The thoracic aorta is normal in caliber. The heart is normal in size. No significant pericardial effusion. Atherosclerotic plaque. Four-vessel coronary calcifications status post coronary artery bypass graft. Mediastinum/Nodes: No pneumomediastinum. No mediastinal hematoma. The esophagus is unremarkable. The thyroid is unremarkable. The central airways are patent. No mediastinal, hilar, or axillary lymphadenopathy. Lungs/Pleura: Bilateral lower lobe and lingular atelectasis. No focal consolidation. No pulmonary nodule. No pulmonary mass. No definite pulmonary contusion or laceration. No pneumatocele formation. No pleural effusion. No pneumothorax. No hemothorax. Musculoskeletal/Chest wall: No chest wall mass. No acute rib or sternal fracture. No spinal fracture. ABDOMEN / PELVIS: Hepatobiliary: Not enlarged. No focal lesion. No laceration or subcapsular hematoma. The gallbladder is otherwise unremarkable with no radio-opaque gallstones. Contracted gallbladder. No biliary ductal dilatation. Pancreas: Normal pancreatic contour. No main pancreatic duct dilatation. Spleen: Splenule is enlarged measuring up to 13.5 cm. No focal lesion. No laceration, subcapsular hematoma, or vascular injury. Adrenals/Urinary Tract: No nodularity bilaterally. Bilateral kidneys enhance symmetrically. No hydronephrosis. No contusion, laceration, or subcapsular hematoma. No injury to the vascular structures or collecting systems. No hydroureter. The urinary bladder is unremarkable. Stomach/Bowel: No small or large bowel wall thickening or dilatation. The appendix is unremarkable. Vasculature/Lymphatics: At least moderate atherosclerotic plaque. No abdominal aorta or iliac aneurysm. No active  contrast extravasation or pseudoaneurysm. No abdominal, pelvic, inguinal lymphadenopathy. Reproductive: The prostate is enlarged measuring up to 4.9 cm. Other: No simple free fluid ascites. No pneumoperitoneum. No hemoperitoneum. No mesenteric hematoma identified. No organized fluid collection. Musculoskeletal: No significant soft tissue hematoma. Anterior abdominal wall subcutaneus soft tissue edema likely due to medication injection (3:89). No acute pelvic fracture. No spinal fracture. Ports and Devices: None. IMPRESSION: 1. At least small volume left hemopneumothorax. 2. Acute displaced left lateral fifth and sixth rib fractures. 3. Bilateral lower lobe and lingular atelectasis with superimposed developing contusions not excluded. 4.  No acute intra-abdominal, intrapelvic traumatic injury. 5. No acute fracture or traumatic malalignment of the thoracic or lumbar spine. 6. Other imaging findings of potential clinical significance: Mild splenomegaly. Prostatomegaly. Aortic Atherosclerosis (ICD10-I70.0). Electronically Signed   By: Tish Frederickson [name]   On: 10/14/2023 21:52   DG Pelvis Portable  Result Date: 10/14/2023 CLINICAL DATA:  fall EXAM: PORTABLE PELVIS 1-2 VIEWS COMPARISON:  CT abdomen pelvis 10/14/2023 FINDINGS: Limited evaluation due to overlapping osseous structures and overlying soft tissues. There is no evidence of pelvic fracture or diastasis. No acute displaced fracture or dislocation of either hips. No pelvic bone lesions are seen. IMPRESSION: Negative for acute traumatic injury. Electronically Signed   By: Tish Frederickson [name]   On: 10/14/2023 21:37   DG Chest Portable 1 View  Result Date: 10/14/2023 CLINICAL DATA:  fall EXAM: PORTABLE CHEST 1 VIEW COMPARISON:  CT C-spine 10/14/2023, chest x-ray 01/05/2023 FINDINGS: The heart and mediastinal contours are within normal limits. Vascular clips overlie the mediastinum. No focal consolidation. No pulmonary edema. No pleural effusion. Trace to  small volume left apical pneumothorax. Trace left pleural fluid. No right pneumothorax. No right pleural effusion. Acute displaced lateral sixth rib fracture. Sternotomy wires are intact.  Cervical spine surgical hardware. IMPRESSION: 1. Small volume left hydropneumothorax. 2. Acute displaced lateral sixth rib fracture. Electronically Signed   By: Tish Frederickson [name]   On: 10/14/2023 21:36    Anti-infectives: Anti-infectives (From  admission, onward)    None       Assessment/Plan:  77 y/o M with multiple comorbidity who presented after a mechanical fall 10/14/23 and has a CT showing left-sided rib fx and hemopneumothorax    L 5/6th rib fx - pain control, pulm toilet, IS Hemopneumothorax - repeat CXR today appears better. Repeat CXR in AM. Continue IS and ambulation.  PT to see today.      CAD s/p CABG and DES (POA) - Home meds, cardiac monitor Hypothyroidism (POA) - Home synthroid DM (POA) - adding back home meds.  Giving lower doses of his long acting insulin to decrease risk of hypoglycemia at first.  Depression (POA) - continue home meds GERD (POA)  - Protonix CKD (POA) - Avoid nephrotoxic meds, monitor UOP, daily BMP pAFib (POA) - cardiac monitor, holding Eliquis      FEN - Regular VTE - Holding, start tomorrow if HCT stable ID - None indicated  Admit - med/surg, not ready for d/c today.    LOS: 0 days    Almond Lint 10/15/2023

## 2023-10-15 NOTE — ED Notes (Signed)
Pt given orange juice with food for cbg of 63, IP RN informed.

## 2023-10-15 NOTE — Evaluation (Signed)
Physical Therapy Evaluation Patient Details Name: [name] MRN: [id number] DOB: [birth year] Today's Date: 10/15/2023  History of Present Illness  77 y/o M presented to the ED 11/8 after he sustained a mechanical fall. CT imaging shows left 5/6th rib fractures with small hemopneumothorax. PMHx: hypothyroidism, CAD s/p CABG and DES, CKD, Depression, DM, HTN, GERD, OSA, pAFib (on Eliquis), and nephrolithiasis.   Clinical Impression  Pt admitted with above diagnosis. Previously independent with RW use outdoors only. 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 He also reports hx of peripheral neuropathy of the feet, likely attributing to falls. Has not seen a physical therapist for fall/balance/gait training. Currently requires CGA-supervision level assist with bed mobility, transfer, and gait. Plans to stay with daughter at [name] and reports she can assist with all needs until he recovers. SpO2 in mid to high 90s on RA during session. O2 reapplied end of session. Would greatly benefit from HHPT follow-up at [name] and consider transition to neuro-vestibular rehab specialist at outpatient physical therapy clinic once capable of safely transporting to appointments. Pt currently with functional limitations due to the deficits listed below (see PT Problem List). Pt will benefit from acute skilled PT to increase their independence and safety with mobility to allow discharge.           If plan is discharge home, recommend the following: A little help with walking and/or transfers;A little help with bathing/dressing/bathroom;Assistance with cooking/housework;Assist for transportation;Help with stairs or ramp for entrance   Can travel by private vehicle        Equipment Recommendations None recommended by PT  Recommendations for Other Services       Functional Status Assessment Patient has had a  recent decline in their functional status and demonstrates the ability to make significant improvements in function in a reasonable and predictable amount of time.     Precautions / Restrictions Precautions Precautions: Fall (left rib fx) Restrictions Weight Bearing Restrictions: No      Mobility  Bed Mobility Overal bed mobility: Needs Assistance Bed Mobility: Rolling, Sidelying to Sit Rolling: Contact guard assist Sidelying to sit: Contact guard assist       General bed mobility comments: Educated on log roll towards right side, VC for technique, light rail use, HOB eleavted, CGA for transition to EOB, very slow and guarded. Pillow for bracing.    Transfers Overall transfer level: Needs assistance Equipment used: Rolling walker (2 wheels) Transfers: Sit to/from Stand Sit to Stand: Contact guard assist           General transfer comment: Slow and effortful, cues for hand placement to minimze discomfort and maximize lift. CGA for safety, RW for support to steady.    Ambulation/Gait Ambulation/Gait assistance: Supervision Gait Distance (Feet): 50 Feet Assistive device: Rolling walker (2 wheels) Gait Pattern/deviations: Step-through pattern, Decreased stride length, Trunk flexed Gait velocity: dec Gait velocity interpretation: <1.31 ft/sec, indicative of household ambulator   General Gait Details: Slow and guarded, stable with RW for support and feels confident with this device. Educated on turns, backing up, and awareness of proximity to device for stability. Distance limited by pain. HR in 60s, SpO2 mid to upper 90s on RA.  Stairs            Wheelchair Mobility     Tilt Bed    Modified Rankin (Stroke Patients Only)  Balance Overall balance assessment: Needs assistance Sitting-balance support: No upper extremity supported, Feet supported Sitting balance-Leahy Scale: Fair     Standing balance support: Bilateral upper extremity supported, Reliant  on assistive device for balance Standing balance-Leahy Scale: Poor                               Pertinent Vitals/Pain Pain Assessment Pain Assessment: Faces Faces Pain Scale: Hurts whole lot Pain Location: Lt ribs with movement. Pain Descriptors / Indicators: Aching Pain Intervention(s): Monitored during session, Repositioned    Home Living Family/patient expects to be discharged to:: Private residence Living Arrangements: Alone Available Help at Discharge: Family;Available PRN/intermittently;Neighbor Type of Home: House Home Access: Stairs to enter Entrance Stairs-Rails: Right Entrance Stairs-Number of Steps: 4+1   Home Layout: One level Home Equipment: Agricultural consultant (2 wheels);Cane - single point Additional Comments: Plans to stay at daughter's house upon [name]. States no stairs, they can help him as needed.    Prior Function Prior Level of Function : Independent/Modified Independent;History of Falls (last six months)             Mobility Comments: no device used inside, reports multiple falls. uses RW when he leaves the house ADLs Comments: no longer driving. Daughter brings groceries once per week. Able to bathe, dress, and cook for himself. Has 3 dogs, lets them outside but does not walk them.     Extremity/Trunk Assessment   Upper Extremity Assessment Upper Extremity Assessment: Defer to OT evaluation    Lower Extremity Assessment Lower Extremity Assessment: Generalized weakness       Communication   Communication Communication: No apparent difficulties  Cognition Arousal: Alert Behavior During Therapy: WFL for tasks assessed/performed Overall Cognitive Status: Within Functional Limits for tasks assessed                                          General Comments General comments (skin integrity, edema, etc.): SpO2 95% on RA, HR 64 at rest. Reapplied O2 end of session.    Exercises Other Exercises Other Exercises: Reviewed  IS use. He was doing it wrong initially.   Assessment/Plan    PT Assessment Patient needs continued PT services  PT Problem List Decreased strength;Decreased activity tolerance;Decreased balance;Decreased mobility;Decreased range of motion;Decreased knowledge of use of DME;Pain       PT Treatment Interventions DME instruction;Gait training;Stair training;Functional mobility training;Therapeutic activities;Therapeutic exercise;Balance training;Neuromuscular re-education;Patient/family education;Modalities    PT Goals (Current goals can be found in the Care Plan section)  Acute Rehab PT Goals Patient Stated Goal: Return home with family support PT Goal Formulation: With patient Time For Goal Achievement: 10/29/23 Potential to Achieve Goals: Good    Frequency Min 1X/week     Co-evaluation               AM-PAC PT "6 Clicks" Mobility  Outcome Measure Help needed turning from your back to your side while in a flat bed without using bedrails?: A Little Help needed moving from lying on your back to sitting on the side of a flat bed without using bedrails?: A Little Help needed moving to and from a bed to a chair (including a wheelchair)?: A Little Help needed standing up from a chair using your arms (e.g., wheelchair or bedside chair)?: A Little Help needed to walk in hospital room?: A  Little Help needed climbing 3-5 steps with a railing? : A Little 6 Click Score: 18    End of Session Equipment Utilized During Treatment: Oxygen Activity Tolerance: Patient tolerated treatment well Patient left: in bed;with call bell/phone within reach;with bed alarm set;with SCD's reapplied (Encouraged OOB for lunch and frequently during stay; denies at this time.) Nurse Communication: Mobility status PT Visit Diagnosis: Unsteadiness on feet (R26.81);Other abnormalities of gait and mobility (R26.89);Repeated falls (R29.6);Muscle weakness (generalized) (M62.81);History of falling  (Z91.81);Pain Pain - Right/Left: Left Pain - part of body:  (flank)    Time: 8119-1478 PT Time Calculation (min) (ACUTE ONLY): 23 min   Charges:   PT Evaluation $PT Eval Low Complexity: 1 Low PT Treatments $Therapeutic Activity: 8-22 mins PT General Charges $$ ACUTE PT VISIT: 1 Visit         Kathlyn Sacramento, PT, DPT [facility]  Rehabilitation Services Physical Therapist Office: [phone] Website: [email or web address]   Berton Mount 10/15/2023, 11:26 AM

## 2023-10-16 ENCOUNTER — Inpatient Hospital Stay (HOSPITAL_COMMUNITY): Payer: Medicare Other

## 2023-10-16 DIAGNOSIS — T1490XA Injury, unspecified, initial encounter: Secondary | ICD-10-CM

## 2023-10-16 LAB — BASIC METABOLIC PANEL
Anion gap: 8 (ref 5–15)
BUN: 14 mg/dL (ref 8–23)
CO2: 26 mmol/L (ref 22–32)
Calcium: 8.7 mg/dL — ABNORMAL LOW (ref 8.9–10.3)
Chloride: 106 mmol/L (ref 98–111)
Creatinine, Ser: 1.41 mg/dL — ABNORMAL HIGH (ref 0.61–1.24)
GFR, Estimated: 52 mL/min — ABNORMAL LOW (ref >60)
Glucose, Bld: 100 mg/dL — ABNORMAL HIGH (ref 70–99)
Potassium: 3.7 mmol/L (ref 3.5–5.1)
Sodium: 140 mmol/L (ref 135–145)

## 2023-10-16 LAB — GLUCOSE, CAPILLARY
Glucose-Capillary: 128 mg/dL — ABNORMAL HIGH (ref 70–99)
Glucose-Capillary: 108 mg/dL — ABNORMAL HIGH (ref 70–99)
Glucose-Capillary: 94 mg/dL (ref 70–99)
Glucose-Capillary: 105 mg/dL — ABNORMAL HIGH (ref 70–99)

## 2023-10-16 LAB — CBC
HCT: 37.1 % — ABNORMAL LOW (ref 39.0–52.0)
Hemoglobin: 12.5 g/dL — ABNORMAL LOW (ref 13.0–17.0)
MCH: 30.6 pg (ref 26.0–34.0)
MCHC: 33.7 g/dL (ref 30.0–36.0)
MCV: 90.9 fL (ref 80.0–100.0)
Platelets: 150 10*3/uL (ref 150–400)
RBC: 4.08 MIL/uL — ABNORMAL LOW (ref 4.22–5.81)
RDW: 14.1 % (ref 11.5–15.5)
WBC: 8 10*3/uL (ref 4.0–10.5)
nRBC: 0 % (ref 0.0–0.2)

## 2023-10-16 MED ORDER — GADOBUTROL 1 MMOL/ML IV SOLN
7.0000 mL | Freq: Once | INTRAVENOUS | Status: AC | PRN
  Administered 2023-10-16: 7 mL via INTRAVENOUS

## 2023-10-16 NOTE — Plan of Care (Signed)

## 2023-10-16 NOTE — Progress Notes (Signed)
Subjective/Chief Complaint: Pain better. Still dizzy. Glucose better.     Objective: Vital signs in last 24 hours: Temp:  [97.6 F (36.4 C)-98.2 F (36.8 C)] 97.9 F (36.6 C) (11/10 0805) Pulse Rate:  [61-75] 64 (11/10 0805) Resp:  [15-18] 18 (11/10 0805) BP: (110-122)/(55-64) 115/55 (11/10 0805) SpO2:  [95 %-100 %] 99 % (11/10 0805) Last BM Date : 10/14/23  Intake/Output from previous day: 11/09 0701 - 11/10 0700 In: 240 [P.O.:240] Out: -  Intake/Output this shift: No intake/output data recorded.  General appearance: alert, cooperative, no distress Resp: breathing comfortably GI: soft, non tender, sl protuberant.   Lab Results:  Recent Labs    10/15/23 0603 10/16/23 0605  WBC 8.8 8.0  HGB 13.2 12.5*  HCT 37.4* 37.1*  PLT 144* 150   BMET Recent Labs    10/15/23 0603 10/16/23 0605  NA 137 140  K 3.7 3.7  CL 104 106  CO2 27 26  GLUCOSE 200* 100*  BUN 16 14  CREATININE 1.37* 1.41*  CALCIUM 8.8* 8.7*   PT/INR No results for input(s): "LABPROT", "INR" in the last 72 hours. ABG No results for input(s): "PHART", "HCO3" in the last 72 hours.  Invalid input(s): "PCO2", "PO2"  Studies/Results: DG CHEST PORT 1 VIEW  Result Date: 10/15/2023 CLINICAL DATA:  Pneumothorax. EXAM: PORTABLE CHEST 1 VIEW COMPARISON:  October 14, 2023. FINDINGS: Stable cardiomediastinal silhouette. Status post coronary bypass graft. Stable small left apical pneumothorax. Left basilar subsegmental atelectasis and small effusion remains. Bony thorax is unremarkable. IMPRESSION: Stable small left apical pneumothorax. Mild left basilar subsegmental atelectasis and small pleural effusion is noted. Electronically Signed   By: Lupita Raider [name]   On: 10/15/2023 08:17   CT Head Wo Contrast  Result Date: 10/14/2023 CLINICAL DATA:  Head trauma, moderate-severe Fall on thinners; Polytrauma, blunt striking his head he is on a bloodd thinner he is c/o lt sided head pain he was tangeled up in  his dog collar and lost his balance EXAM: CT HEAD WITHOUT CONTRAST CT CERVICAL SPINE WITHOUT CONTRAST TECHNIQUE: Multidetector CT imaging of the head and cervical spine was performed following the standard protocol without intravenous contrast. Multiplanar CT image reconstructions of the cervical spine were also generated. RADIATION DOSE REDUCTION: This exam was performed according to the departmental dose-optimization program which includes automated exposure control, adjustment of the mA and/or kV according to patient size and/or use of iterative reconstruction technique. COMPARISON:  None Available. FINDINGS: CT HEAD FINDINGS Brain: Cerebral ventricle sizes are concordant with the degree of cerebral volume loss. Patchy and confluent areas of decreased attenuation are noted throughout the deep and periventricular white matter of the cerebral hemispheres bilaterally, compatible with chronic microvascular ischemic disease. No evidence of large-territorial acute infarction. No parenchymal hemorrhage. No mass lesion. No extra-axial collection. No mass effect or midline shift. No hydrocephalus. Basilar cisterns are patent. Vascular: No hyperdense vessel. Atherosclerotic calcifications are present within the cavernous internal carotid and vertebral arteries. Skull: No acute fracture or focal lesion. Sinuses/Orbits: Left sphenoid sinus mucosal thickening. Other paranasal sinuses and mastoid air cells are clear. Bilateral lens replacement. Otherwise the orbits are unremarkable. Other: None. CT CERVICAL SPINE FINDINGS Alignment: Normal. Skull base and vertebrae: C5-C7 anterior cervical discectomy and fusion. Multilevel moderate degenerative changes spine. No associated severe osseous neural foraminal or central canal stenosis. No acute fracture. No aggressive appearing focal osseous lesion or focal pathologic process. Soft tissues and spinal canal: No prevertebral fluid or swelling. No visible canal hematoma. Upper  chest:  Trace to small volume left pneumohemothorax. Other: None. IMPRESSION: 1. No acute intracranial abnormality. 2. No acute displaced fracture or traumatic listhesis of the cervical spine. 3. Trace to small volume left apical pneumothorax. These results were called by telephone at the time of interpretation on 10/14/2023 at 9:43 pm to provider Dr. Theresia Lo, who verbally acknowledged these results. Electronically Signed   By: Tish Frederickson [name]   On: 10/14/2023 21:56   CT Cervical Spine Wo Contrast  Result Date: 10/14/2023 CLINICAL DATA:  Head trauma, moderate-severe Fall on thinners; Polytrauma, blunt striking his head he is on a bloodd thinner he is c/o lt sided head pain he was tangeled up in his dog collar and lost his balance EXAM: CT HEAD WITHOUT CONTRAST CT CERVICAL SPINE WITHOUT CONTRAST TECHNIQUE: Multidetector CT imaging of the head and cervical spine was performed following the standard protocol without intravenous contrast. Multiplanar CT image reconstructions of the cervical spine were also generated. RADIATION DOSE REDUCTION: This exam was performed according to the departmental dose-optimization program which includes automated exposure control, adjustment of the mA and/or kV according to patient size and/or use of iterative reconstruction technique. COMPARISON:  None Available. FINDINGS: CT HEAD FINDINGS Brain: Cerebral ventricle sizes are concordant with the degree of cerebral volume loss. Patchy and confluent areas of decreased attenuation are noted throughout the deep and periventricular white matter of the cerebral hemispheres bilaterally, compatible with chronic microvascular ischemic disease. No evidence of large-territorial acute infarction. No parenchymal hemorrhage. No mass lesion. No extra-axial collection. No mass effect or midline shift. No hydrocephalus. Basilar cisterns are patent. Vascular: No hyperdense vessel. Atherosclerotic calcifications are present within the cavernous internal  carotid and vertebral arteries. Skull: No acute fracture or focal lesion. Sinuses/Orbits: Left sphenoid sinus mucosal thickening. Other paranasal sinuses and mastoid air cells are clear. Bilateral lens replacement. Otherwise the orbits are unremarkable. Other: None. CT CERVICAL SPINE FINDINGS Alignment: Normal. Skull base and vertebrae: C5-C7 anterior cervical discectomy and fusion. Multilevel moderate degenerative changes spine. No associated severe osseous neural foraminal or central canal stenosis. No acute fracture. No aggressive appearing focal osseous lesion or focal pathologic process. Soft tissues and spinal canal: No prevertebral fluid or swelling. No visible canal hematoma. Upper chest: Trace to small volume left pneumohemothorax. Other: None. IMPRESSION: 1. No acute intracranial abnormality. 2. No acute displaced fracture or traumatic listhesis of the cervical spine. 3. Trace to small volume left apical pneumothorax. These results were called by telephone at the time of interpretation on 10/14/2023 at 9:43 pm to provider Dr. Theresia Lo, who verbally acknowledged these results. Electronically Signed   By: Tish Frederickson [name]   On: 10/14/2023 21:56   CT CHEST ABDOMEN PELVIS W CONTRAST  Result Date: 10/14/2023 CLINICAL DATA:  Polytrauma, blunt.  Fall EXAM: CT CHEST, ABDOMEN, AND PELVIS WITH CONTRAST TECHNIQUE: Multidetector CT imaging of the chest, abdomen and pelvis was performed following the standard protocol during bolus administration of intravenous contrast. RADIATION DOSE REDUCTION: This exam was performed according to the departmental dose-optimization program which includes automated exposure control, adjustment of the mA and/or kV according to patient size and/or use of iterative reconstruction technique. CONTRAST:  75mL OMNIPAQUE IOHEXOL 350 MG/ML SOLN COMPARISON:  None Available. FINDINGS: CHEST: Cardiovascular: No aortic injury. The thoracic aorta is normal in caliber. The heart is normal in  size. No significant pericardial effusion. Atherosclerotic plaque. Four-vessel coronary calcifications status post coronary artery bypass graft. Mediastinum/Nodes: No pneumomediastinum. No mediastinal hematoma. The esophagus is unremarkable. The thyroid is unremarkable.  The central airways are patent. No mediastinal, hilar, or axillary lymphadenopathy. Lungs/Pleura: Bilateral lower lobe and lingular atelectasis. No focal consolidation. No pulmonary nodule. No pulmonary mass. No definite pulmonary contusion or laceration. No pneumatocele formation. No pleural effusion. No pneumothorax. No hemothorax. Musculoskeletal/Chest wall: No chest wall mass. No acute rib or sternal fracture. No spinal fracture. ABDOMEN / PELVIS: Hepatobiliary: Not enlarged. No focal lesion. No laceration or subcapsular hematoma. The gallbladder is otherwise unremarkable with no radio-opaque gallstones. Contracted gallbladder. No biliary ductal dilatation. Pancreas: Normal pancreatic contour. No main pancreatic duct dilatation. Spleen: Splenule is enlarged measuring up to 13.5 cm. No focal lesion. No laceration, subcapsular hematoma, or vascular injury. Adrenals/Urinary Tract: No nodularity bilaterally. Bilateral kidneys enhance symmetrically. No hydronephrosis. No contusion, laceration, or subcapsular hematoma. No injury to the vascular structures or collecting systems. No hydroureter. The urinary bladder is unremarkable. Stomach/Bowel: No small or large bowel wall thickening or dilatation. The appendix is unremarkable. Vasculature/Lymphatics: At least moderate atherosclerotic plaque. No abdominal aorta or iliac aneurysm. No active contrast extravasation or pseudoaneurysm. No abdominal, pelvic, inguinal lymphadenopathy. Reproductive: The prostate is enlarged measuring up to 4.9 cm. Other: No simple free fluid ascites. No pneumoperitoneum. No hemoperitoneum. No mesenteric hematoma identified. No organized fluid collection. Musculoskeletal: No  significant soft tissue hematoma. Anterior abdominal wall subcutaneus soft tissue edema likely due to medication injection (3:89). No acute pelvic fracture. No spinal fracture. Ports and Devices: None. IMPRESSION: 1. At least small volume left hemopneumothorax. 2. Acute displaced left lateral fifth and sixth rib fractures. 3. Bilateral lower lobe and lingular atelectasis with superimposed developing contusions not excluded. 4.  No acute intra-abdominal, intrapelvic traumatic injury. 5. No acute fracture or traumatic malalignment of the thoracic or lumbar spine. 6. Other imaging findings of potential clinical significance: Mild splenomegaly. Prostatomegaly. Aortic Atherosclerosis (ICD10-I70.0). Electronically Signed   By: Tish Frederickson [name]   On: 10/14/2023 21:52   DG Pelvis Portable  Result Date: 10/14/2023 CLINICAL DATA:  fall EXAM: PORTABLE PELVIS 1-2 VIEWS COMPARISON:  CT abdomen pelvis 10/14/2023 FINDINGS: Limited evaluation due to overlapping osseous structures and overlying soft tissues. There is no evidence of pelvic fracture or diastasis. No acute displaced fracture or dislocation of either hips. No pelvic bone lesions are seen. IMPRESSION: Negative for acute traumatic injury. Electronically Signed   By: Tish Frederickson [name]   On: 10/14/2023 21:37   DG Chest Portable 1 View  Result Date: 10/14/2023 CLINICAL DATA:  fall EXAM: PORTABLE CHEST 1 VIEW COMPARISON:  CT C-spine 10/14/2023, chest x-ray 01/05/2023 FINDINGS: The heart and mediastinal contours are within normal limits. Vascular clips overlie the mediastinum. No focal consolidation. No pulmonary edema. No pleural effusion. Trace to small volume left apical pneumothorax. Trace left pleural fluid. No right pneumothorax. No right pleural effusion. Acute displaced lateral sixth rib fracture. Sternotomy wires are intact.  Cervical spine surgical hardware. IMPRESSION: 1. Small volume left hydropneumothorax. 2. Acute displaced lateral sixth rib  fracture. Electronically Signed   By: Tish Frederickson [name]   On: 10/14/2023 21:36    Anti-infectives: Anti-infectives (From admission, onward)    None       Assessment/Plan:  77 y/o M with multiple comorbidity who presented after a mechanical fall 10/14/23 and has a CT showing left-sided rib fx and hemopneumothorax    L 5/6th rib fx - pain control, pulm toilet, IS Hemopneumothorax -await read on today's film.  Looks like pneumothorax continues to improve.  Continue IS and ambulation.  Pt and OT are ok with d/c home.  Does need help and no stairs.       CAD s/p CABG and DES (POA) - Home meds, cardiac monitor Hypothyroidism (POA) - Home synthroid CKD - stage III DM (POA) - adding back home meds.  Giving lower doses of his long acting insulin to decrease risk of hypoglycemia at first.  Depression (POA) - continue home meds GERD (POA)  - Protonix CKD (POA) - Avoid nephrotoxic meds, monitor UOP, daily BMP pAFib (POA) - cardiac monitor, holding Eliquis      FEN - Regular VTE - start today.  ID - None indicated  Dispo- med/surg, barriers to discharge are safe discharge plan.  Patient does pretty well, but has advancing dementia and dizziness that impairs his ability to care for himself.  Getting neurology to see and ordering MRI brain to assess.  May need geropsych to see.   Patient and family goals are to live independently as long as it is safe and reasonable.  This might not be the case for much longer.  Discussed with daughter and POA.     LOS: 1 day    Almond Lint 10/16/2023

## 2023-10-16 NOTE — Inpatient Diabetes Management (Signed)
Inpatient Diabetes Program Recommendations  AACE/ADA: New Consensus Statement on Inpatient Glycemic Control (2015)  Target Ranges:  Prepandial:   less than 140 mg/dL      Peak postprandial:   less than 180 mg/dL (1-2 hours)      Critically ill patients:  140 - 180 mg/dL   Lab Results  Component Value Date   GLUCAP 128 (H) 10/16/2023   HGBA1C 10.3 (H) 09/01/2023    Review of Glycemic Control  Latest Reference Range & Units 10/15/23 09:28 10/15/23 12:00 10/15/23 15:29 10/15/23 16:47 10/15/23 21:24 10/16/23 08:04  Glucose-Capillary 70 - 99 mg/dL 161 (H) 096 (H) 045 (H) 131 (H) 78 128 (H)   Diabetes history: DM 2 Outpatient Diabetes medications: Toujeo 60 units, Humalog 12 units tid, Farxiga 10 mg Daily Current orders for Inpatient glycemic control:  Farxiga 10 mg Daily Novolog 0-9 units tid + hs Novolog 8 units tid meal coverage  A1c 10.3% on 9/26 at PCP visit, pt has had many follow up visits for medication titration and DM follow up since A1c obtained.  Pt follow up visits 10/3 10/8 10/11   -   Keep pt on semglee 40 units -   Discontinue Novolog 8 units tid meal coverage (pt not receiving sand glucose trends look ok)  Thanks,  Christena Deem RN, MSN, BC-ADM Inpatient Diabetes Coordinator Team Pager [phone] (8a-5p)

## 2023-10-16 NOTE — Consult Note (Signed)
NEUROLOGY CONSULT NOTE   Date of service: October 16, 2023 Patient Name: [name] [name] MRN:  [id number] DOB:  December 03, 1946 Chief Complaint: "Dizziness" Requesting Provider: Md, Trauma, MD  History of Present Illness  [name] [name] is a 77 y.o. male  has a past medical history of Acquired atrophy of thyroid (06/05/2013), Anemia, Anxiety, Bradycardia, CAD (coronary artery disease), Carotid artery disease (HCC) (08/01/2015), Cerebral ischemia, CKD (chronic kidney disease), stage III (HCC), Concussion, Depression, Diabetes mellitus (HCC), Edema, Essential hypertension (06/05/2013), Falling episodes, Family history of adverse reaction to anesthesia, Gastric ulcer, GERD (gastroesophageal reflux disease), H/O medication noncompliance, H/O multiple concussions, Hard of hearing, Heart murmur, History of blood transfusion (12/20/2013), History of kidney stones, CABG, Hyperlipidemia, Hypertension, Iron deficiency anemia, Long term (current) use of anticoagulants [Z79.01] (07/20/2016), Low back pain (06/12/2009), Nephrolithiasis, Orthostasis, OSA (obstructive sleep apnea), PAF (paroxysmal atrial fibrillation) (HCC), PSVT (paroxysmal supraventricular tachycardia) (HCC), RBBB (07/09/2009), RLS (restless legs syndrome) (09/19/2009), Seizure disorder (HCC) (01/09/2017), Spondylosis, Syncope (03/2016), TBI (traumatic brain injury) (HCC) (2019), Thyroid disease, Tubulovillous adenoma of colon (2007), Vitamin D deficiency (05/17/2017), and Wears glasses. who presents after a fall at home in which she sustained rib fractures and a hemopneumothorax.  Patient states that he has been having dizziness for a long time and is not able to state exactly when it started.  He reports the dizziness is worse when he stands up, and that he often needs to pause a minute after standing to get his bearings so he does not fall.  He is unable to state whether he feels like the room is spinning or he is spinning.  He states he has had  frequent falls at home but attributes this to not using his cane as directed.  He was seen by Dr. Teresa Coombs for this problem in July, and it was attributed to peripheral neuropathy and poor control of his diabetes.  Patient states that he has been working on this and that he feels his control of his diabetes is improving.  Patient verbalizes intent to use his cane as directed to prevent future falls.  MRI brain was negative for acute abnormality.    ROS  Comprehensive ROS performed and pertinent positives documented in HPI    Past History   Past Medical History:  Diagnosis Date   Acquired atrophy of thyroid 06/05/2013   Overview:  July 2014: controlled on synthroid since 2012 May 2015: decreased to Synthroid 50 mcg  Last Assessment & Plan:  Pt doing well with most recent TSH within normal range.  Will continue current dosage of Synthroid . Pt reminded to take medication on empty stomach. Will continue to monitor with periodic laboratory assessment in 3 months.    Anemia    hemoglobin 7.4, iron deficiency, January, 2011, 2 unit transfusion, endoscopy normal, capsule endoscopy February, 2011 no small bowel abnormalities.   Most likely source gastric erosions, followed by GI   Anxiety    Bradycardia    CAD (coronary artery disease)    A. CABG in 2000,status post cardiac cath in 2006, 2009 ....continued chest pain and SOB despite oral medication adjestments including Ranexa. B. Cath November 2009/ mRCA - 2.75 x 23 Abbott Xience V drug-eluting stent ...11/26/2008 to distal  RCA leading to acute marginal.  C. Cath 07/2012 for CP - stable anatomy, med rx. d. cath 2015 and 05/30/2015 stable anatomy, consider Myoview if has CP again   Carotid artery disease (HCC) 08/01/2015   Doppler, May 29, 2015, 1-39% bilateral ICA  Cerebral ischemia    MRI November, 2010, chronic microvascular ischemia   CKD (chronic kidney disease), stage III (HCC)    Concussion    Depression    Bipolar   Diabetes  mellitus (HCC)    Edema    Essential hypertension 06/05/2013   Overview:  July 2014: Controlled with Bystolic Sep 2015: Changed to atenolol.  Last Assessment & Plan:  Will change to atenolol for cost and follow Improved.  Medication compliance strongly encouraged BP: 116/64 mmHg    Overview:  July 2014: Controlled with Bystolic Sep 2015: Changed to atenolol.  Last Assessment & Plan:  Will change to atenolol for cost and follow   Falling episodes    these have occurred in the past and again recurring 2011   Family history of adverse reaction to anesthesia    "mother died during bypass surgery but not sure if it has to do with anesthesia"   Gastric ulcer    GERD (gastroesophageal reflux disease)    H/O medication noncompliance    Due to loss of insurance   H/O multiple concussions    Hard of hearing    Heart murmur    History of blood transfusion 12/20/2013   History of kidney stones    Hx of CABG    2000,  / one median sternotomy suture broken her chest x-ray November, 2010, no clinical significance   Hyperlipidemia    Hypertension    pt. denies   Iron deficiency anemia    Long term (current) use of anticoagulants [Z79.01] 07/20/2016   Low back pain 06/12/2009   Qualifier: Diagnosis of  By: Nena Jordan    Nephrolithiasis    Orthostasis    OSA (obstructive sleep apnea)    PAF (paroxysmal atrial fibrillation) (HCC)    a. dx 2017, started on amiodarone/warfarin but patient intermittently noncompliant.   PSVT (paroxysmal supraventricular tachycardia) (HCC)    RBBB 07/09/2009   Qualifier: Diagnosis of  By: Myrtis Ser, MD, [facility], Lemmie Evens    RLS (restless legs syndrome) 09/19/2009   Qualifier: Diagnosis of  By: Nena Jordan   Overview:  July 2014: Controlled with Mirapex  Last Assessment & Plan:  Patient is doing well. Will continue current management and follow clinically.   Seizure disorder (HCC) 01/09/2017   Spondylosis    C5-6, C6-7 MRI 2010   Syncope 03/2016   TBI  (traumatic brain injury) (HCC) 2019   Thyroid disease    Tubulovillous adenoma of colon 2007   Vitamin D deficiency 05/17/2017   Wears glasses     Past Surgical History:  Procedure Laterality Date   ANTERIOR CERVICAL DECOMP/DISCECTOMY FUSION N/A 05/31/2016   Procedure: ANTERIOR CERVICAL DECOMPRESSION/DISCECTOMY FUSION CERVICAL FIVE-SIX,CERVICAL SIX-SEVEN;  Surgeon: Julio Sicks, MD;  Location: [facility] NEURO ORS;  Service: Neurosurgery;  Laterality: N/A;   CARDIAC CATHETERIZATION N/A 05/30/2015   Procedure: Left Heart Cath and Coronary Angiography;  Surgeon: Marykay Lex, MD;  Location: [facility] INVASIVE CV LAB;  Service: Cardiovascular;  Laterality: N/A;   CATARACT EXTRACTION     COLONOSCOPY     CORONARY ARTERY BYPASS GRAFT     2000   ESOPHAGOGASTRODUODENOSCOPY     LEFT HEART CATH AND CORS/GRAFTS ANGIOGRAPHY N/A 12/15/2017   Procedure: LEFT HEART CATH AND CORS/GRAFTS ANGIOGRAPHY;  Surgeon: Corky Crafts, MD;  Location: [facility] INVASIVE CV LAB;  Service: Cardiovascular;  Laterality: N/A;   LEFT HEART CATHETERIZATION WITH CORONARY/GRAFT ANGIOGRAM N/A 08/01/2012   Procedure: LEFT HEART CATHETERIZATION WITH CORONARY/GRAFT ANGIOGRAM;  Surgeon: Herby Abraham, MD;  Location: [facility] CATH LAB;  Service: Cardiovascular;  Laterality: N/A;   LEFT HEART CATHETERIZATION WITH CORONARY/GRAFT ANGIOGRAM N/A 01/03/2015   Procedure: LEFT HEART CATHETERIZATION WITH Isabel Caprice;  Surgeon: Runell Gess, MD;  Location: [facility] CATH LAB;  Service: Cardiovascular;  Laterality: N/A;   NASAL SEPTUM SURGERY     UP3   PERCUTANEOUS CORONARY STENT INTERVENTION (PCI-S)  10/06/2008   mRCA PCI  2.75 x 23 Abbott Xience V drug-eluting stent    RIGHT/LEFT HEART CATH AND CORONARY/GRAFT ANGIOGRAPHY N/A 01/07/2020   Procedure: RIGHT/LEFT HEART CATH AND CORONARY/GRAFT ANGIOGRAPHY;  Surgeon: Lyn Records, MD;  Location: [facility] INVASIVE CV LAB;  Service: Cardiovascular;  Laterality: N/A;   TOTAL KNEE ARTHROPLASTY Right  09/22/2021   Procedure: RIGHT TOTAL KNEE ARTHROPLASTY;  Surgeon: Marcene Corning, MD;  Location: [facility] ORS;  Service: Orthopedics;  Laterality: Right;   ULTRASOUND GUIDANCE FOR VASCULAR ACCESS  12/15/2017   Procedure: Ultrasound Guidance For Vascular Access;  Surgeon: Corky Crafts, MD;  Location: [facility] INVASIVE CV LAB;  Service: Cardiovascular;;    Family History: Family History  Problem Relation Age of Onset   Pancreatic cancer Brother    Diabetes Brother    Coronary artery disease Brother    Stroke Brother    Diabetes Brother    Diabetes Mother    Heart failure Mother    Heart failure Father    Hypothyroidism Brother    Coronary artery disease Brother    Other Brother        colon surgery   Heart attack Other        Nephew   Irregular heart beat Daughter    Cancer Maternal Grandmother        unknown    Colon cancer Neg Hx    Stomach cancer Neg Hx    Liver cancer Neg Hx    Rectal cancer Neg Hx    Esophageal cancer Neg Hx     Social History  reports that he has never smoked. He has never been exposed to tobacco smoke. He has never used smokeless tobacco. He reports that he does not drink alcohol and does not use drugs.  Allergies  Allergen Reactions   Morphine Other (See Comments)    Hallucinations   Shellfish-Derived Products Itching    Medications   Current Facility-Administered Medications:    acetaminophen (TYLENOL) tablet 1,000 mg, 1,000 mg, Oral, Q6H, Hillery Hunter Lucilla Edin, MD, 1,000 mg at 10/16/23 1312   amiodarone (PACERONE) tablet 200 mg, 200 mg, Oral, Daily, Moise Boring, MD, 200 mg at 10/16/23 1610   amitriptyline (ELAVIL) tablet 10-30 mg, 10-30 mg, Oral, QHS, Almond Lint, MD, 30 mg at 10/15/23 2121   aspirin EC tablet 81 mg, 81 mg, Oral, Daily, Moise Boring, MD, 81 mg at 10/16/23 9604   atorvastatin (LIPITOR) tablet 80 mg, 80 mg, Oral, Daily, Moise Boring, MD, 80 mg at 10/16/23 5409   dapagliflozin propanediol (FARXIGA) tablet 10 mg,  10 mg, Oral, Daily, Almond Lint, MD, 10 mg at 10/16/23 8119   docusate sodium (COLACE) capsule 100 mg, 100 mg, Oral, BID, Moise Boring, MD, 100 mg at 10/16/23 0918   [START ON 10/17/2023] enoxaparin (LOVENOX) injection 30 mg, 30 mg, Subcutaneous, Q12H, Moise Boring, MD   hydrALAZINE (APRESOLINE) injection 10 mg, 10 mg, Intravenous, Q2H PRN, Moise Boring, MD   HYDROmorphone (DILAUDID) injection 0.5 mg, 0.5 mg, Intravenous, Q2H PRN, Moise Boring, MD, 0.5 mg at 10/16/23  1140   hydrOXYzine (ATARAX) tablet 10 mg, 10 mg, Oral, TID PRN, Almond Lint, MD   insulin aspart (novoLOG) injection 0-5 Units, 0-5 Units, Subcutaneous, QHS, Metzger, Lucilla Edin, MD   insulin aspart (novoLOG) injection 0-9 Units, 0-9 Units, Subcutaneous, TID WC, Hillery Hunter Lucilla Edin, MD, 1 Units at 10/16/23 0917   insulin aspart (novoLOG) injection 8 Units, 8 Units, Subcutaneous, TID WC, Almond Lint, MD, 8 Units at 10/16/23 1312   insulin glargine-yfgn (SEMGLEE) injection 40 Units, 40 Units, Subcutaneous, Q1500, Almond Lint, MD, 40 Units at 10/16/23 1516   isosorbide mononitrate (IMDUR) 24 hr tablet 120 mg, 120 mg, Oral, Daily, Almond Lint, MD, 120 mg at 10/16/23 8413   levothyroxine (SYNTHROID) tablet 125 mcg, 125 mcg, Oral, Daily, Moise Boring, MD, 125 mcg at 10/16/23 2440   loratadine (CLARITIN) tablet 10 mg, 10 mg, Oral, QPM, Moise Boring, MD, 10 mg at 10/15/23 1649   memantine (NAMENDA) tablet 10 mg, 10 mg, Oral, BID, Moise Boring, MD, 10 mg at 10/16/23 1027   methocarbamol (ROBAXIN) tablet 500 mg, 500 mg, Oral, Q8H, 500 mg at 10/16/23 0917 **OR** methocarbamol (ROBAXIN) injection 500 mg, 500 mg, Intravenous, Q8H, Moise Boring, MD   metoprolol tartrate (LOPRESSOR) injection 5 mg, 5 mg, Intravenous, Q6H PRN, Hillery Hunter Lucilla Edin, MD   nitroGLYCERIN (NITROSTAT) SL tablet 0.4 mg, 0.4 mg, Sublingual, Q5 min PRN, Almond Lint, MD   ondansetron (ZOFRAN-ODT) disintegrating tablet 4 mg,  4 mg, Oral, Q6H PRN **OR** ondansetron (ZOFRAN) injection 4 mg, 4 mg, Intravenous, Q6H PRN, Moise Boring, MD   oxyCODONE (Oxy IR/ROXICODONE) immediate release tablet 5 mg, 5 mg, Oral, Q4H PRN, Moise Boring, MD, 5 mg at 10/15/23 0817   pantoprazole (PROTONIX) EC tablet 40 mg, 40 mg, Oral, Daily, Moise Boring, MD, 40 mg at 10/16/23 2536   polyethylene glycol (MIRALAX / GLYCOLAX) packet 17 g, 17 g, Oral, Daily PRN, Moise Boring, MD   pramipexole (MIRAPEX) tablet 0.5-1 mg, 0.5-1 mg, Oral, QHS, Almond Lint, MD, 0.5 mg at 10/15/23 2121   QUEtiapine (SEROQUEL) tablet 25 mg, 25 mg, Oral, QHS, Almond Lint, MD, 25 mg at 10/15/23 2121   ranolazine (RANEXA) 12 hr tablet 500 mg, 500 mg, Oral, BID, Moise Boring, MD, 500 mg at 10/16/23 6440   sertraline (ZOLOFT) tablet 100 mg, 100 mg, Oral, Daily, Almond Lint, MD, 100 mg at 10/16/23 0917   venlafaxine XR (EFFEXOR-XR) 24 hr capsule 75 mg, 75 mg, Oral, Q breakfast, Almond Lint, MD, 75 mg at 10/16/23 0918  Vitals   Vitals:   10/16/23 0359 10/16/23 0411 10/16/23 0805 10/16/23 1543  BP: 122/64 116/61 (!) 115/55 121/65  Pulse: 62 62 64 69  Resp: 15 18 18 17   Temp: 98.2 F (36.8 C) 98.2 F (36.8 C) 97.9 F (36.6 C) 98.3 F (36.8 C)  TempSrc: Oral Oral Oral Oral  SpO2: 97% 100% 99% 97%  Weight:      Height:        Body mass index is 25.41 kg/m.  Physical Exam   Constitutional: Appears well-developed and well-nourished.  Psych: Affect appropriate to situation.  Eyes: No scleral injection.  HENT: No OP obstruction.  Head: Normocephalic.  Respiratory: Effort normal, non-labored breathing.  Skin: WDI.   Neurologic Examination    NEURO:  Mental Status: Alert and oriented to person place and situation, states year is 2014 and is able to give some history of his current illness Speech/Language: speech is without dysarthria or aphasia.  Cranial Nerves:  II: PERRL.  III, IV, VI: EOMI. Eyelids elevate  symmetrically.  V: Sensation is intact to light touch and symmetrical to face.  VII: Smile is symmetrical. Able to puff cheeks and raise eyebrows.  VIII: hearing intact to voice. IX, X: Phonation is normal.  GL:OVFIEPPI shrug 5/5. XII: tongue is midline without fasciculations. Motor: 5/5 strength to all muscle groups tested.  Tone: is normal and bulk is normal Sensation- Intact to light touch bilaterally.  Coordination: FTN intact bilaterally.No drift.  Gait- deferred Unable to perform full HINTS exam due to some stiffness of neck, which patient states has been present since his fall, but no nystagmus or skew seen  Labs/Imaging/Neurodiagnostic studies   CBC:  Recent Labs  Lab 2023-10-16 1949 Oct 16, 2023 2010 10/15/23 0603 10/16/23 0605  WBC 9.2   < > 8.8 8.0  NEUTROABS 6.9  --   --   --   HGB 13.9   < > 13.2 12.5*  HCT 39.7   < > 37.4* 37.1*  MCV 88.8   < > 89.7 90.9  PLT 172   < > 144* 150   < > = values in this interval not displayed.    Basic Metabolic Panel:  Lab Results  Component Value Date   NA 140 10/16/2023   K 3.7 10/16/2023   CO2 26 10/16/2023   GLUCOSE 100 (H) 10/16/2023   BUN 14 10/16/2023   CREATININE 1.41 (H) 10/16/2023   CALCIUM 8.7 (L) 10/16/2023   GFRNONAA 52 (L) 10/16/2023   GFRAA 38 (L) 10/23/2020    Lipid Panel:  Lab Results  Component Value Date   LDLCALC 37 11/12/2022    HgbA1c:  Lab Results  Component Value Date   HGBA1C 10.3 (H) 09/01/2023    Urine Drug Screen: No results found for: "LABOPIA", "COCAINSCRNUR", "LABBENZ", "AMPHETMU", "THCU", "LABBARB"   Alcohol Level     Component Value Date/Time   ETH <10 01/15/2016 1510    INR  Lab Results  Component Value Date   INR 1.2 06/16/2022    APTT  Lab Results  Component Value Date   APTT 32 06/16/2022    AED levels: No results found for: "PHENYTOIN", "ZONISAMIDE", "LAMOTRIGINE", "LEVETIRACETA"    CT Head without contrast(Personally reviewed): No acute abnormality  CT  cervical spine demonstrates no acute displaced fracture or traumatic listhesis of the cervical spine  MRI brain: No acute abnormality  Impression   DEFOREST FLOERKE is a 77 y.o. male  has a past medical history of Acquired atrophy of thyroid (06/05/2013), Anemia, Anxiety, Bradycardia, CAD (coronary artery disease), Carotid artery disease (HCC) (08/01/2015), Cerebral ischemia, CKD (chronic kidney disease), stage III (HCC), Concussion, Depression, Diabetes mellitus (HCC), Edema, Essential hypertension (06/05/2013), Falling episodes, Family history of adverse reaction to anesthesia, Gastric ulcer, GERD (gastroesophageal reflux disease), H/O medication noncompliance, H/O multiple concussions, Hard of hearing, Heart murmur, History of blood transfusion (12/20/2013), History of kidney stones, CABG, Hyperlipidemia, Hypertension, Iron deficiency anemia, Long term (current) use of anticoagulants [Z79.01] (07/20/2016), Low back pain (06/12/2009), Nephrolithiasis, Orthostasis, OSA (obstructive sleep apnea), PAF (paroxysmal atrial fibrillation) (HCC), PSVT (paroxysmal supraventricular tachycardia) (HCC), RBBB (07/09/2009), RLS (restless legs syndrome) (09/19/2009), Seizure disorder (HCC) (01/09/2017), Spondylosis, Syncope (03/2016), TBI (traumatic brain injury) (HCC) (2019), Thyroid disease, Tubulovillous adenoma of colon (2007), Vitamin D deficiency (05/17/2017), and Wears glasses.  Patient presents after a fall at home where he tripped over his dog's leash.  Patient states that he has had frequent falls at home and attributes some of  this to not using his cane.  He states that he is willing to use his cane as directed in the future to prevent future falls.  Patient reports that he has been having problems with dizziness for quite a long time and is unable to say exactly when they started.  Dizziness is worse when patient stands up, so we will check orthostatic vital signs.  Patient was seen by outpatient neurology for  this problem, and it was attributed to dehydration and poor control of his diabetes along with peripheral neuropathy.  Emphasized with patient the importance of obtaining good control of his diabetes, standing up slowly and pausing after he stands to get his bearings and using his cane as directed to prevent falls in the setting of dizziness.  Recommendations  -Orthostatic vital signs -PT/OT -Follow-up with outpatient neurology -We will signoff. ______________________________________________________________________    Signed,  Marjorie Smolder [facility]   NEUROHOSPITALIST ADDENDUM Performed a face to face diagnostic evaluation.   I have reviewed the contents of history and physical exam as documented by PA/ARNP/Resident and agree with above documentation.  I have discussed and formulated the above plan as documented. Edits to the note have been made as needed.  Erick Blinks, MD [facility] [phone]   If 7pm to 7am, please call on call as listed on AMION.

## 2023-10-16 NOTE — Progress Notes (Signed)
PHARMACY - ANTICOAGULATION CONSULT NOTE  Pharmacy Consult for Lovenox Indication: VTE prophylaxis  Allergies  Allergen Reactions   Morphine Other (See Comments)    Hallucinations   Shellfish-Derived Products Itching    Patient Measurements: Height: 5\' 8"  (172.7 cm) Weight: 75.8 kg (167 lb 1.7 oz) IBW/kg (Calculated) : 68.4  Vital Signs: Temp: 97.9 F (36.6 C) (11/10 0805) Temp Source: Oral (11/10 0805) BP: 115/55 (11/10 0805) Pulse Rate: 64 (11/10 0805)  Labs: Recent Labs    10/14/23 1949 10/14/23 2010 10/15/23 0126 10/15/23 0603 10/16/23 0605  HGB 13.9   < > 13.6 13.2 12.5*  HCT 39.7   < > 39.3 37.4* 37.1*  PLT 172  --  156 144* 150  CREATININE 1.23   < > 1.20 1.37* 1.41*  TROPONINIHS 11  --   --   --   --    < > = values in this interval not displayed.    Estimated Creatinine Clearance: 43.8 mL/min (A) (by C-G formula based on SCr of 1.41 mg/dL (H)).   Medical History: Past Medical History:  Diagnosis Date   Acquired atrophy of thyroid 06/05/2013   Overview:  July 2014: controlled on synthroid since 2012 May 2015: decreased to Synthroid 50 mcg  Last Assessment & Plan:  Pt doing well with most recent TSH within normal range.  Will continue current dosage of Synthroid . Pt reminded to take medication on empty stomach. Will continue to monitor with periodic laboratory assessment in 3 months.    Anemia    hemoglobin 7.4, iron deficiency, January, 2011, 2 unit transfusion, endoscopy normal, capsule endoscopy February, 2011 no small bowel abnormalities.   Most likely source gastric erosions, followed by GI   Anxiety    Bradycardia    CAD (coronary artery disease)    A. CABG in 2000,status post cardiac cath in 2006, 2009 ....continued chest pain and SOB despite oral medication adjestments including Ranexa. B. Cath November 2009/ mRCA - 2.75 x 23 Abbott Xience V drug-eluting stent ...11/26/2008 to distal  RCA leading to acute marginal.  C. Cath 07/2012 for CP  - stable anatomy, med rx. d. cath 2015 and 05/30/2015 stable anatomy, consider Myoview if has CP again   Carotid artery disease (HCC) 08/01/2015   Doppler, May 29, 2015, 1-39% bilateral ICA    Cerebral ischemia    MRI November, 2010, chronic microvascular ischemia   CKD (chronic kidney disease), stage III (HCC)    Concussion    Depression    Bipolar   Diabetes mellitus (HCC)    Edema    Essential hypertension 06/05/2013   Overview:  July 2014: Controlled with Bystolic Sep 2015: Changed to atenolol.  Last Assessment & Plan:  Will change to atenolol for cost and follow Improved.  Medication compliance strongly encouraged BP: 116/64 mmHg    Overview:  July 2014: Controlled with Bystolic Sep 2015: Changed to atenolol.  Last Assessment & Plan:  Will change to atenolol for cost and follow   Falling episodes    these have occurred in the past and again recurring 2011   Family history of adverse reaction to anesthesia    "mother died during bypass surgery but not sure if it has to do with anesthesia"   Gastric ulcer    GERD (gastroesophageal reflux disease)    H/O medication noncompliance    Due to loss of insurance   H/O multiple concussions    Hard of hearing    Heart murmur  History of blood transfusion 12/20/2013   History of kidney stones    Hx of CABG    2000,  / one median sternotomy suture broken her chest x-ray November, 2010, no clinical significance   Hyperlipidemia    Hypertension    pt. denies   Iron deficiency anemia    Long term (current) use of anticoagulants [Z79.01] 07/20/2016   Low back pain 06/12/2009   Qualifier: Diagnosis of  By: Nena Jordan    Nephrolithiasis    Orthostasis    OSA (obstructive sleep apnea)    PAF (paroxysmal atrial fibrillation) (HCC)    a. dx 2017, started on amiodarone/warfarin but patient intermittently noncompliant.   PSVT (paroxysmal supraventricular tachycardia) (HCC)    RBBB 07/09/2009   Qualifier: Diagnosis of  By: Myrtis Ser, MD,  [facility], Lemmie Evens    RLS (restless legs syndrome) 09/19/2009   Qualifier: Diagnosis of  By: Nena Jordan   Overview:  July 2014: Controlled with Mirapex  Last Assessment & Plan:  Patient is doing well. Will continue current management and follow clinically.   Seizure disorder (HCC) 01/09/2017   Spondylosis    C5-6, C6-7 MRI 2010   Syncope 03/2016   TBI (traumatic brain injury) (HCC) 2019   Thyroid disease    Tubulovillous adenoma of colon 2007   Vitamin D deficiency 05/17/2017   Wears glasses      Assessment: 77 y/o M with multiple comorbidity who presented after a mechanical fall 10/14/23 and has a CT showing left-sided rib fx and hemopneumothorax .  Pharmacy consulted for Lovenox dosing for AKI.    Scr - 1.41, CrCl - 44 ml/min  Plan:  Continue Lovenox 30 mg subcutaneous q12hr Monitor Scr and UOP closely, and adjust if CrCl < 30 ml/min  Jeanella Cara, PharmD, [facility] Clinical Pharmacist Please see AMION for all Pharmacists' Contact Phone Numbers 10/16/2023, 9:25 AM

## 2023-10-17 ENCOUNTER — Inpatient Hospital Stay (HOSPITAL_COMMUNITY): Payer: Medicare Other

## 2023-10-17 LAB — BASIC METABOLIC PANEL
Anion gap: 9 (ref 5–15)
BUN: 16 mg/dL (ref 8–23)
CO2: 23 mmol/L (ref 22–32)
Calcium: 8.5 mg/dL — ABNORMAL LOW (ref 8.9–10.3)
Chloride: 107 mmol/L (ref 98–111)
Creatinine, Ser: 1.31 mg/dL — ABNORMAL HIGH (ref 0.61–1.24)
GFR, Estimated: 57 mL/min — ABNORMAL LOW (ref >60)
Glucose, Bld: 128 mg/dL — ABNORMAL HIGH (ref 70–99)
Potassium: 3.7 mmol/L (ref 3.5–5.1)
Sodium: 139 mmol/L (ref 135–145)

## 2023-10-17 LAB — GLUCOSE, CAPILLARY
Glucose-Capillary: 127 mg/dL — ABNORMAL HIGH (ref 70–99)
Glucose-Capillary: 88 mg/dL (ref 70–99)
Glucose-Capillary: 126 mg/dL — ABNORMAL HIGH (ref 70–99)
Glucose-Capillary: 121 mg/dL — ABNORMAL HIGH (ref 70–99)

## 2023-10-17 LAB — CBC
HCT: 37.7 % — ABNORMAL LOW (ref 39.0–52.0)
Hemoglobin: 12.5 g/dL — ABNORMAL LOW (ref 13.0–17.0)
MCH: 30.1 pg (ref 26.0–34.0)
MCHC: 33.2 g/dL (ref 30.0–36.0)
MCV: 90.8 fL (ref 80.0–100.0)
Platelets: 150 10*3/uL (ref 150–400)
RBC: 4.15 MIL/uL — ABNORMAL LOW (ref 4.22–5.81)
RDW: 13.9 % (ref 11.5–15.5)
WBC: 7.9 10*3/uL (ref 4.0–10.5)
nRBC: 0 % (ref 0.0–0.2)

## 2023-10-17 MED ORDER — LIDOCAINE 5 % EX PTCH
2.0000 | MEDICATED_PATCH | CUTANEOUS | Status: DC
  Administered 2023-10-17 – 2023-10-18 (×2): 2 via TRANSDERMAL
  Filled 2023-10-17 (×2): qty 2

## 2023-10-17 NOTE — Plan of Care (Signed)

## 2023-10-17 NOTE — Progress Notes (Signed)
Subjective/Chief Complaint: Sitting up eating breakfast. Having some left chest wall pain from rib fractures. Denies SHOB or pain elsewhere. He has not moved much yesterday but not having dizziness at rest. Says he is drinking 5-6 cups of water a day  Objective: Vital signs in last 24 hours: Temp:  [97.6 F (36.4 C)-98.3 F (36.8 C)] 97.6 F (36.4 C) (11/11 0739) Pulse Rate:  [64-74] 64 (11/11 0739) Resp:  [16-18] 16 (11/11 0739) BP: (115-123)/(46-65) 122/55 (11/11 0739) SpO2:  [95 %-99 %] 96 % (11/11 0739) Last BM Date : 10/14/23  Intake/Output from previous day: 11/10 0701 - 11/11 0700 In: 680 [P.O.:680] Out: -  Intake/Output this shift: No intake/output data recorded.  General appearance: alert, cooperative, no distress Resp: breathing comfortably. Pulls 1000 on IS GI: soft, non tender, protuberant.   Lab Results:  Recent Labs    10/15/23 0603 10/16/23 0605  WBC 8.8 8.0  HGB 13.2 12.5*  HCT 37.4* 37.1*  PLT 144* 150   BMET Recent Labs    10/15/23 0603 10/16/23 0605  NA 137 140  K 3.7 3.7  CL 104 106  CO2 27 26  GLUCOSE 200* 100*  BUN 16 14  CREATININE 1.37* 1.41*  CALCIUM 8.8* 8.7*   PT/INR No results for input(s): "LABPROT", "INR" in the last 72 hours. ABG No results for input(s): "PHART", "HCO3" in the last 72 hours.  Invalid input(s): "PCO2", "PO2"  Studies/Results: MR BRAIN W WO CONTRAST  Result Date: 10/16/2023 CLINICAL DATA:  Dizziness EXAM: MRI HEAD WITHOUT AND WITH CONTRAST TECHNIQUE: Multiplanar, multiecho pulse sequences of the brain and surrounding structures were obtained without and with intravenous contrast. CONTRAST:  7mL GADAVIST GADOBUTROL 1 MMOL/ML IV SOLN COMPARISON:  08/30/2021 MRI head, correlation is also made with 10/14/2023 CT head FINDINGS: Brain: No restricted diffusion to suggest acute or subacute infarct. No abnormal parenchymal or meningeal enhancement. No acute hemorrhage, mass, mass effect, or midline shift. No  hydrocephalus or extra-axial collection. Pituitary and craniocervical junction within normal limits. No hemosiderin deposition to suggest remote hemorrhage. Cerebral volume is within normal limits for age. Scattered T2 hyperintense signal in the periventricular white matter, likely the sequela of mild to moderate chronic small vessel ischemic disease. Vascular: Normal arterial flow voids. Normal arterial and venous enhancement. Skull and upper cervical spine: Normal marrow signal. Sinuses/Orbits: Mucosal thickening in the left sphenoid sinus and posterior ethmoid air cells. Status post bilateral lens replacements. Other: The mastoid air cells are well aerated. IMPRESSION: No acute intracranial process. No evidence of acute or subacute infarct. Electronically Signed   By: Wiliam Ke [name]   On: 10/16/2023 22:44   DG CHEST PORT 1 VIEW  Result Date: 10/16/2023 CLINICAL DATA:  Follow-up of pneumothorax. EXAM: PORTABLE CHEST 1 VIEW COMPARISON:  Plain film of yesterday FINDINGS: Cervical spine fixation. Prior median sternotomy. CABG. Apical lordotic positioning. Midline trachea. Mild cardiomegaly. No pleural fluid. 5% left apical pneumothorax is not significantly changed. Left-greater-than-right base atelectasis and/or scarring. Slightly improved left base aeration. Numerous leads and wires project over the chest. IMPRESSION: 1. Unchanged approximately 5% left apical pneumothorax. 2. Slightly improved left base aeration; subsegmental atelectasis and/or scarring remains. Electronically Signed   By: Jeronimo Greaves [name]   On: 10/16/2023 10:32    Anti-infectives: Anti-infectives (From admission, onward)    None       Assessment/Plan:  77 y/o M with multiple comorbidity who presented after a mechanical fall 10/14/23 and has a CT showing left-sided rib fx and hemopneumothorax  L 5/6th rib fx - pain control, pulm toilet, IS Hemopneumothorax - CXR 11/10 with stable PTX.  Continue IS and ambulation.  Pt and OT  are ok with d/c home with [facility].  Does need help and no stairs. PT reccs HH PT      CAD s/p CABG and DES (POA) - Home meds, cardiac monitor Hypothyroidism (POA) - Home synthroid CKD - stage III DM (POA) - adding back home meds.  Giving lower doses of his long acting insulin to decrease risk of hypoglycemia at first. Recent glucose have overall been in good range  Depression (POA) - continue home meds GERD (POA)  - Protonix CKD (POA) - Avoid nephrotoxic meds, monitor UOP, daily BMP. CR better at 1.31 today pAFib (POA) - cardiac monitor, holding Eliquis  Dizziness - neuro consulted and signed off. MRI brain negative. Has orthostatic hypotension. PT reccs neuro-vestibular rehab specialist at dc     FEN - Regular VTE - lovenox ID - None indicated   Dispo- med/surg, barriers to discharge are safe discharge plan.  Patient does pretty well, but has advancing dementia and dizziness that impairs his ability to care for himself.   He plans to discharge with daughter initially and will have someone with him 24 hours initially  Patient and family goals are to live independently as long as it is safe and reasonable.  This might not be the case for much longer.  Overall medically stable for discharge pending repeat CXR but will need to assure safe dispo plan    LOS: 2 days   Eric Form, [facility] 10/17/2023, 7:56 AM Please see Amion for pager number during day hours 7:00am-4:30pm

## 2023-10-17 NOTE — Progress Notes (Signed)
Occupational Therapy Treatment Patient Details Name: [name] [name] MRN: [id number] DOB: [birth year] Today's Date: 10/17/2023   History of present illness 77 y/o M presented to the ED 11/8 after he sustained a mechanical fall. CT imaging shows left 5/6th rib fractures with small hemopneumothorax. PMHx: hypothyroidism, CAD s/p CABG and DES, CKD, Depression, DM, HTN, GERD, OSA, pAFib (on Eliquis), and nephrolithiasis.   OT comments  Patient making good gains with OT treatment with CGA for bed mobility and is able to ambulate to bathroom for toilet transfer with CGA and patient able to perform toilet hygiene seated. Patient stood at sink for grooming and bathing tasks with CGA for safety. AE training performed with reacher and sock aide for LB dressing with patient requiring mod assist due to rib pain. Energy conservation strategies handout provided to patient and reviewed. Acute OT to continue to follow to address bed mobility, transfers, self care, and energy conservation.       If plan is discharge home, recommend the following:  A little help with walking and/or transfers;A lot of help with bathing/dressing/bathroom;Assistance with cooking/housework;Assist for transportation;Help with stairs or ramp for entrance   Equipment Recommendations  BSC/3in1    Recommendations for Other Services      Precautions / Restrictions Precautions Precautions: Fall (left rib fx) Restrictions Weight Bearing Restrictions: No       Mobility Bed Mobility Overal bed mobility: Needs Assistance Bed Mobility: Rolling, Sidelying to Sit Rolling: Contact guard assist Sidelying to sit: Contact guard assist, Used rails       General bed mobility comments: increased time and cues for bed rail use to assist    Transfers Overall transfer level: Needs assistance Equipment used: Rolling walker (2 wheels) Transfers: Sit to/from Stand Sit to Stand: Contact guard assist           General transfer  comment: CGA from EOB and toilet     Balance Overall balance assessment: Needs assistance Sitting-balance support: No upper extremity supported, Feet supported Sitting balance-Leahy Scale: Fair Sitting balance - Comments: EOB   Standing balance support: Single extremity supported, Bilateral upper extremity supported, During functional activity Standing balance-Leahy Scale: Poor Standing balance comment: one extremity support while standing at sink for self care                           ADL either performed or assessed with clinical judgement   ADL Overall ADL's : Needs assistance/impaired     Grooming: Wash/dry hands;Wash/dry face;Oral care;Contact guard assist;Standing Grooming Details (indicate cue type and reason): at sink Upper Body Bathing: Minimal assistance;Standing Upper Body Bathing Details (indicate cue type and reason): for back     Upper Body Dressing : Minimal assistance;Sitting Upper Body Dressing Details (indicate cue type and reason): to donn gown Lower Body Dressing: Moderate assistance;With adaptive equipment;Sitting/lateral leans Lower Body Dressing Details (indicate cue type and reason): education on reacher use to doff socks and sock aide to donn with  mod assist to use due to pain Toilet Transfer: Contact guard assist;Ambulation;Regular Toilet;Rolling walker (2 wheels) Toilet Transfer Details (indicate cue type and reason): ambulated into bathroom Toileting- Clothing Manipulation and Hygiene: Supervision/safety;Sitting/lateral lean         General ADL Comments: increased time due to pain    Extremity/Trunk Assessment              Vision       Perception     Praxis  Cognition Arousal: Alert Behavior During Therapy: WFL for tasks assessed/performed Overall Cognitive Status: No family/caregiver present to determine baseline cognitive functioning Area of Impairment: Safety/judgement                          Safety/Judgement: Decreased awareness of safety     General Comments: cues for safety, talkative        Exercises      Shoulder Instructions       General Comments Energy conservation strategies handout provided and reviewed with patient    Pertinent Vitals/ Pain       Pain Assessment Pain Assessment: Faces Faces Pain Scale: Hurts little more Pain Location: Lt ribs with movement. Pain Descriptors / Indicators: Aching Pain Intervention(s): Limited activity within patient's tolerance, Monitored during session, Premedicated before session, Repositioned  Home Living                                          Prior Functioning/Environment              Frequency  Min 1X/week        Progress Toward Goals  OT Goals(current goals can now be found in the care plan section)  Progress towards OT goals: Progressing toward goals  Acute Rehab OT Goals Patient Stated Goal: go home and be with his dogs OT Goal Formulation: With patient Time For Goal Achievement: 10/29/23 Potential to Achieve Goals: Good ADL Goals Pt Will Perform Grooming: with modified independence;standing Pt Will Perform Upper Body Dressing: with modified independence;sitting Pt Will Perform Lower Body Dressing: with modified independence;with adaptive equipment;sit to/from stand Pt Will Transfer to Toilet: with modified independence;ambulating Pt Will Perform Toileting - Clothing Manipulation and hygiene: with modified independence;sit to/from stand Additional ADL Goal #1: Pt will verbalize at least 3 energy conservation strategies for ADL with no cues  Plan      Co-evaluation                 AM-PAC OT "6 Clicks" Daily Activity     Outcome Measure   Help from another person eating meals?: None Help from another person taking care of personal grooming?: A Little Help from another person toileting, which includes using toliet, bedpan, or urinal?: A Little Help from another  person bathing (including washing, rinsing, drying)?: A Lot Help from another person to put on and taking off regular upper body clothing?: A Little Help from another person to put on and taking off regular lower body clothing?: A Lot 6 Click Score: 17    End of Session Equipment Utilized During Treatment: Gait belt;Rolling walker (2 wheels)  OT Visit Diagnosis: Unsteadiness on feet (R26.81);Other abnormalities of gait and mobility (R26.89);Muscle weakness (generalized) (M62.81);History of falling (Z91.81);Repeated falls (R29.6)   Activity Tolerance Patient tolerated treatment well   Patient Left in chair;with call bell/phone within reach;with chair alarm set   Nurse Communication Mobility status;Precautions        Time: [id number] OT Time Calculation (min): 31 min  Charges: OT General Charges $OT Visit: 1 Visit OT Treatments $Self Care/Home Management : 23-37 mins  Alfonse Flavors, OTA Acute Rehabilitation Services  Office [phone]   Dewain Penning 10/17/2023, 12:04 PM

## 2023-10-17 NOTE — Progress Notes (Addendum)
Physical Therapy Treatment Patient Details Name: [name] [name] MRN: [id number] DOB: February 18, 1946 Today's Date: 10/17/2023   History of Present Illness 77 y/o M presented to the ED 11/8 after he sustained a mechanical fall. CT imaging shows left 5/6th rib fractures with small hemopneumothorax. PMHx: hypothyroidism, CAD s/p CABG and DES, CKD, Depression, DM, HTN, GERD, OSA, pAFib (on Eliquis), and nephrolithiasis.    PT Comments  Pt received in chair, agreeable to therapy session, pt c/o fatigue after sitting up in chair a few hours and pain with increased severity while standing/using RW. Pt BP checked and with symptomatic drop in BP initially but stable with 3 minute standing BP reading, pt c/o mostly fatigue/dizziness and pain at L rib fx site, RN/MD notified. Pt needing increased assist, up to minA (chair follow for safety) with backward stepping/turning and stair ascent/descent. Pt able to perform 3 steps with R rail/LUE external assist for stability but starting to buckle on descending third step and chair pulled up for pt to sit in. Pt may need to enter home via wheelchair if discharging this date due to increased c/o pain while standing/performing functional tasks. Per chart review, pt will have assist from family for first few days, will need lift/steadying assist for all functional mobility when he goes home, especially with gait/stairs. RN/MD notified of pt increase in pain and corresponding instability/buckling with minimal exertion. Pt continues to benefit from PT services to progress toward functional mobility goals.    10/17/23 1225  Vital Signs  BP Location Left Arm  BP Method Automatic  Patient Position (if appropriate) Orthostatic Vitals  Orthostatic Sitting  BP- Sitting 130/64  Pulse- Sitting 78  Orthostatic Standing at 0 minutes  BP- Standing at 0 minutes 113/58  Pulse- Standing at 0 minutes 79  Orthostatic Standing at 3 minutes  BP- Standing at 3 minutes 127/62  Pulse-  Standing at 3 minutes 79     If plan is discharge home, recommend the following: A little help with walking and/or transfers;A little help with bathing/dressing/bathroom;Assistance with cooking/housework;Assist for transportation;Help with stairs or ramp for entrance;Supervision due to cognitive status   Can travel by private vehicle        Equipment Recommendations  None recommended by PT (pt reports he has RW and cane at home; could consider BSC if daughter does not have rails over toilet at home)    Recommendations for Other Services       Precautions / Restrictions Precautions Precautions: Fall (left rib fx) Precaution Comments: chronic dizziness Restrictions Weight Bearing Restrictions: No     Mobility  Bed Mobility Overal bed mobility: Needs Assistance Bed Mobility: Sit to Sidelying       Sit to supine: Mod assist, HOB elevated   General bed mobility comments: BLE assist to return to supine, increased assist due to pt c/o pain; pt manually splinting with LUE on pillow due to severe c/o pain    Transfers Overall transfer level: Needs assistance Equipment used: Rolling walker (2 wheels) Transfers: Sit to/from Stand Sit to Stand: Min assist           General transfer comment: minA from chair<>RW x3 reps and from EOB x2 reps, flexed forward and pt stops halfway through raising up, needing heavy minA to continue trunk extension/for stability while reaching for RW due to c/o pain. CGA to minA for lowering assist when sitting, with cues for safe RUE/LUE placement    Ambulation/Gait Ambulation/Gait assistance: Min assist, +2 safety/equipment Gait Distance (Feet): 25 Feet  Assistive device: Rolling walker (2 wheels) Gait Pattern/deviations: Step-through pattern, Decreased stride length, Trunk flexed       General Gait Details: Slow and guarded, stable with RW for support for forward stepping and feels confident with this device. Educated on turns, backing up, and  awareness of proximity to device for stability, pt needing minA with backward stepping and turning due to increased c/o pain and pt keeping eyes closed as he fatigued/pain increased despite cues to maintain eyes open. ~28ft, then ~53ft x2 with seated breaks between. Distance limited by pain. HR 70-75 bpm. chair follow for safety given pt fatigue/symptoms.   Stairs Stairs: Yes Stairs assistance: Min assist Stair Management: One rail Right, Step to pattern, Forwards (LUE support at forearm/shoulder for pt safety) Number of Stairs: 3 General stair comments: standard step in stairwell x3 reps (defer 3 in a row due to pt symptoms of pain and mild buckling with 2nd/3rd step attempt, pt had to sit down back in chair prior to attempting 4th step and reports pain is too high to continue. Poor portable pulse ox signal, spO2 WFL on RA once back to room where better sensor located   Wheelchair Mobility     Tilt Bed    Modified Rankin (Stroke Patients Only)       Balance Overall balance assessment: Needs assistance Sitting-balance support: No upper extremity supported, Feet supported Sitting balance-Leahy Scale: Fair Sitting balance - Comments: EOB   Standing balance support: Single extremity supported, Bilateral upper extremity supported, During functional activity Standing balance-Leahy Scale: Poor Standing balance comment: forward flexion, pt tends to keep eyes closed; c/o dizziness, heavy reliance on RW; poor stability esp with backward stepping                            Cognition Arousal: Alert Behavior During Therapy: WFL for tasks assessed/performed Overall Cognitive Status: No family/caregiver present to determine baseline cognitive functioning Area of Impairment: Safety/judgement                         Safety/Judgement: Decreased awareness of safety     General Comments: cues for safety, talkative        Exercises Other Exercises Other Exercises:  Reviewed IS use, pt defers at time of session due to increased c/o pain    General Comments General comments (skin integrity, edema, etc.): see orthostatics in comments above; dizziness persists but pt unable to describe quality/severity of dizziness, mostly c/o L rib increased pain.      Pertinent Vitals/Pain Pain Assessment Pain Assessment: Faces Faces Pain Scale: Hurts whole lot Pain Location: Lt ribs with movement, increases while standing Pain Descriptors / Indicators: Aching    Home Living                          Prior Function            PT Goals (current goals can now be found in the care plan section) Acute Rehab PT Goals Patient Stated Goal: Return home with support from my daughter PT Goal Formulation: With patient Time For Goal Achievement: 10/29/23 Progress towards PT goals: Progressing toward goals (slowly)    Frequency    Min 1X/week      PT Plan      Co-evaluation              AM-PAC PT "6 Clicks" Mobility  Outcome Measure  Help needed turning from your back to your side while in a flat bed without using bedrails?: A Little Help needed moving from lying on your back to sitting on the side of a flat bed without using bedrails?: A Little Help needed moving to and from a bed to a chair (including a wheelchair)?: A Little Help needed standing up from a chair using your arms (e.g., wheelchair or bedside chair)?: A Little Help needed to walk in hospital room?: A Lot (chair follow currently due to pain) Help needed climbing 3-5 steps with a railing? : A Lot 6 Click Score: 16    End of Session Equipment Utilized During Treatment: Gait belt (belt position low on hips due to 3-6 rib fxs) Activity Tolerance: Patient limited by pain Patient left: in bed;with call bell/phone within reach;with bed alarm set;Other (comment) (bed in chair posture as pt preparing to eat lunch, splinting on LUE with pillow for pain and pillows under his RUE to  avoid significant R lean due to guarding) Nurse Communication: Mobility status;Patient requests pain meds;Precautions PT Visit Diagnosis: Unsteadiness on feet (R26.81);Other abnormalities of gait and mobility (R26.89);Repeated falls (R29.6);Muscle weakness (generalized) (M62.81);History of falling (Z91.81);Pain Pain - Right/Left: Left Pain - part of body:  (ribs)     Time: 1210-1250 PT Time Calculation (min) (ACUTE ONLY): 40 min  Charges:    $Gait Training: 8-22 mins $Therapeutic Activity: 23-37 mins PT General Charges $$ ACUTE PT VISIT: 1 Visit                     Asani Mcburney P., PTA Acute Rehabilitation Services Secure Chat Preferred 9a-5:30pm Office: [phone]    Dorathy Kinsman [facility] 10/17/2023, 1:23 PM

## 2023-10-18 LAB — CBC
HCT: 36.8 % — ABNORMAL LOW (ref 39.0–52.0)
Hemoglobin: 12.6 g/dL — ABNORMAL LOW (ref 13.0–17.0)
MCH: 30.4 pg (ref 26.0–34.0)
MCHC: 34.2 g/dL (ref 30.0–36.0)
MCV: 88.9 fL (ref 80.0–100.0)
Platelets: 150 10*3/uL (ref 150–400)
RBC: 4.14 MIL/uL — ABNORMAL LOW (ref 4.22–5.81)
RDW: 13.8 % (ref 11.5–15.5)
WBC: 7.9 10*3/uL (ref 4.0–10.5)
nRBC: 0 % (ref 0.0–0.2)

## 2023-10-18 LAB — BASIC METABOLIC PANEL
Anion gap: 8 (ref 5–15)
BUN: 16 mg/dL (ref 8–23)
CO2: 25 mmol/L (ref 22–32)
Calcium: 8.7 mg/dL — ABNORMAL LOW (ref 8.9–10.3)
Chloride: 106 mmol/L (ref 98–111)
Creatinine, Ser: 1.4 mg/dL — ABNORMAL HIGH (ref 0.61–1.24)
GFR, Estimated: 52 mL/min — ABNORMAL LOW (ref >60)
Glucose, Bld: 76 mg/dL (ref 70–99)
Potassium: 3.4 mmol/L — ABNORMAL LOW (ref 3.5–5.1)
Sodium: 139 mmol/L (ref 135–145)

## 2023-10-18 LAB — GLUCOSE, CAPILLARY
Glucose-Capillary: 63 mg/dL — ABNORMAL LOW (ref 70–99)
Glucose-Capillary: 262 mg/dL — ABNORMAL HIGH (ref 70–99)
Glucose-Capillary: 255 mg/dL — ABNORMAL HIGH (ref 70–99)
Glucose-Capillary: 265 mg/dL — ABNORMAL HIGH (ref 70–99)
Glucose-Capillary: 261 mg/dL — ABNORMAL HIGH (ref 70–99)

## 2023-10-18 MED ORDER — OXYCODONE HCL 5 MG PO TABS
2.5000 mg | ORAL_TABLET | ORAL | Status: DC | PRN
  Administered 2023-10-18 – 2023-10-19 (×5): 5 mg via ORAL
  Filled 2023-10-18 (×5): qty 1

## 2023-10-18 MED ORDER — POTASSIUM CHLORIDE CRYS ER 20 MEQ PO TBCR
40.0000 meq | EXTENDED_RELEASE_TABLET | Freq: Two times a day (BID) | ORAL | Status: AC
  Administered 2023-10-18 (×2): 40 meq via ORAL
  Filled 2023-10-18 (×2): qty 2

## 2023-10-18 MED ORDER — METHOCARBAMOL 500 MG PO TABS
500.0000 mg | ORAL_TABLET | Freq: Four times a day (QID) | ORAL | Status: DC
  Administered 2023-10-18 – 2023-10-19 (×5): 500 mg via ORAL
  Filled 2023-10-18 (×5): qty 1

## 2023-10-18 MED ORDER — HYDROMORPHONE HCL 1 MG/ML IJ SOLN: 0.5000 mg | INTRAMUSCULAR | Status: DC | PRN

## 2023-10-18 MED ORDER — AMITRIPTYLINE HCL 10 MG PO TABS
20.0000 mg | ORAL_TABLET | Freq: Every day | ORAL | Status: DC
  Administered 2023-10-18: 20 mg via ORAL
  Filled 2023-10-18 (×2): qty 2

## 2023-10-18 NOTE — Plan of Care (Signed)
  Problem: Pain Management: Goal: General experience of comfort will improve Outcome: Progressing   Problem: Safety: Goal: Ability to remain free from injury will improve Outcome: Progressing   Problem: Skin Integrity: Goal: Risk for impaired skin integrity will decrease Outcome: Progressing

## 2023-10-18 NOTE — Progress Notes (Signed)
Patient ID: [name] [name], male   DOB: [birth year], 77 y.o.   MRN: [id number] [facility] Surgery Progress Note     Subjective: CC-  Daughter at bedside.  Patient states that he is ok at rest, but has pain in the left chest when he moves. Pulling 1200 on IS. On room air. Daughter states that multiple family members will be able to provide a lot of support after discharge, but there will be gaps of time that he would be alone at home. Concerned about him having to get up during those times.  Objective: Vital signs in last 24 hours: Temp:  [98.1 F (36.7 C)-98.2 F (36.8 C)] 98.2 F (36.8 C) (11/12 0751) Pulse Rate:  [63-70] 65 (11/12 0751) Resp:  [16-18] 17 (11/12 0751) BP: (121-133)/(56-64) 133/60 (11/12 0751) SpO2:  [96 %-98 %] 96 % (11/12 0751) Last BM Date : 10/14/23  Intake/Output from previous day: No intake/output data recorded. Intake/Output this shift: No intake/output data recorded.  PE: Gen:  Alert, NAD, pleasant HEENT: EOM's intact, pupils equal and round Card:  RRR Pulm:  CTAB, no W/R/R, rate and effort normal on room air Abd: Soft, NT/ND Psych: A&Ox4  Skin: no rashes noted, warm and dry  Lab Results:  Recent Labs    10/17/23 0725 10/18/23 0628  WBC 7.9 7.9  HGB 12.5* 12.6*  HCT 37.7* 36.8*  PLT 150 150   BMET Recent Labs    10/17/23 0725 10/18/23 0628  NA 139 139  K 3.7 3.4*  CL 107 106  CO2 23 25  GLUCOSE 128* 76  BUN 16 16  CREATININE 1.31* 1.40*  CALCIUM 8.5* 8.7*   PT/INR No results for input(s): "LABPROT", "INR" in the last 72 hours. CMP     Component Value Date/Time   NA 139 10/18/2023 0628   NA 134 10/23/2020 1054   K 3.4 (L) 10/18/2023 0628   CL 106 10/18/2023 0628   CO2 25 10/18/2023 0628   GLUCOSE 76 10/18/2023 0628   BUN 16 10/18/2023 0628   BUN 22 10/23/2020 1054   CREATININE 1.40 (H) 10/18/2023 0628   CREATININE 2.02 (H) 09/15/2020 0855   CALCIUM 8.7 (L) 10/18/2023 0628   PROT 6.5 10/14/2023 1949   PROT 6.8  09/27/2017 0823   ALBUMIN 3.8 10/14/2023 1949   ALBUMIN 4.4 09/27/2017 0823   AST 44 (H) 10/14/2023 1949   ALT 53 (H) 10/14/2023 1949   ALKPHOS 77 10/14/2023 1949   BILITOT 0.7 10/14/2023 1949   BILITOT 0.6 09/27/2017 0823   GFRNONAA 52 (L) 10/18/2023 0628   GFRAA 38 (L) 10/23/2020 1054   Lipase     Component Value Date/Time   LIPASE 25 02/13/2022 0049       Studies/Results: DG CHEST PORT 1 VIEW  Result Date: 10/17/2023 CLINICAL DATA:  Pneumothorax EXAM: PORTABLE CHEST 1 VIEW COMPARISON:  Chest x-ray 10/16/2023.  Chest CT 10/14/2023. FINDINGS: Small left apical pneumothorax appears unchanged from the prior exam. There are bands of atelectasis in both lung bases similar to prior. There is no pleural effusion. The cardiomediastinal silhouette is within normal limits. Patient is status post cardiac surgery. The osseous structures are stable. IMPRESSION: 1. Small left apical pneumothorax appears unchanged from the prior exam. 2. Bands of atelectasis in both lung bases similar to prior. Electronically Signed   By: Darliss Cheney [name]   On: 10/17/2023 15:54   MR BRAIN W WO CONTRAST  Result Date: 10/16/2023 CLINICAL DATA:  Dizziness EXAM: MRI HEAD WITHOUT AND  WITH CONTRAST TECHNIQUE: Multiplanar, multiecho pulse sequences of the brain and surrounding structures were obtained without and with intravenous contrast. CONTRAST:  7mL GADAVIST GADOBUTROL 1 MMOL/ML IV SOLN COMPARISON:  08/30/2021 MRI head, correlation is also made with 10/14/2023 CT head FINDINGS: Brain: No restricted diffusion to suggest acute or subacute infarct. No abnormal parenchymal or meningeal enhancement. No acute hemorrhage, mass, mass effect, or midline shift. No hydrocephalus or extra-axial collection. Pituitary and craniocervical junction within normal limits. No hemosiderin deposition to suggest remote hemorrhage. Cerebral volume is within normal limits for age. Scattered T2 hyperintense signal in the periventricular white  matter, likely the sequela of mild to moderate chronic small vessel ischemic disease. Vascular: Normal arterial flow voids. Normal arterial and venous enhancement. Skull and upper cervical spine: Normal marrow signal. Sinuses/Orbits: Mucosal thickening in the left sphenoid sinus and posterior ethmoid air cells. Status post bilateral lens replacements. Other: The mastoid air cells are well aerated. IMPRESSION: No acute intracranial process. No evidence of acute or subacute infarct. Electronically Signed   By: Wiliam Ke [name]   On: 10/16/2023 22:44    Anti-infectives: Anti-infectives (From admission, onward)    None        Assessment/Plan 77 y/o M with multiple comorbidity who presented after a mechanical fall 10/14/23 and has a CT showing left-sided rib fx and hemopneumothorax   L 5/6th rib fx - pain control, pulm toilet, IS. Reorder scheduled robaxin. Oxy 2.5-5mg  PRN Hemopneumothorax - CXR 11/11 with stable PTX.  Continue IS and ambulation.     CAD s/p CABG and DES (POA) - Home meds, cardiac monitor Hypothyroidism (POA) - Home synthroid CKD - stage III DM (POA) - adding back home meds.  Giving lower doses of his long acting insulin to decrease risk of hypoglycemia at first. Recent glucose have overall been in good range  Depression (POA) - continue home meds GERD (POA)  - Protonix CKD (POA) - Avoid nephrotoxic meds, monitor UOP. Cr stable at 1.4 pAFib (POA) - cardiac monitor, holding Eliquis  Dizziness - neuro consulted and signed off. MRI brain negative. Has orthostatic hypotension. PT reccs neuro-vestibular rehab specialist at dc   FEN - Regular VTE - lovenox ID - None indicated    Dispo- med/surg, barriers to discharge are safe discharge plan.  Patient does pretty well, but has advancing dementia and dizziness that impairs his ability to care for himself. Patient to work with PT today - I have reached out to them to try and coordinate time while family is present.  Family will be  able to provide support after discharge but there would be gaps of time he would be alone. Will see how he does with therapy today. If not safe to be home for stents of time by himself will consider SNF. Overall medically stable for discharge pending safe dispo plan  I reviewed last 24 h vitals and pain scores, last 48 h intake and output, last 24 h labs and trends, and last 24 h imaging results.    LOS: 3 days    Franne Forts, [facility] Surgery 10/18/2023, 8:52 AM Please see Amion for pager number during day hours 7:00am-4:30pm,

## 2023-10-18 NOTE — Progress Notes (Signed)
Physical Therapy Treatment Patient Details Name: [name] [name] MRN: [id number] DOB: 09/22/1946 Today's Date: 10/18/2023   History of Present Illness 77 y/o M presented to the ED 11/8 after he sustained a mechanical fall. CT imaging shows left 5/6th rib fractures with small hemopneumothorax. PMHx: hypothyroidism, CAD s/p CABG and DES, CKD, Depression, DM, HTN, GERD, OSA, pAFib (on Eliquis), and nephrolithiasis.    PT Comments  PT treatment focused on progressive gait and stair training to prepare pt for discharge home. Pt was able to ambulate household distances with the use of a RW and CGA. PT challenged the pt by adding head nods and turns during gait. The pt reported increased dizziness during dynamic gait, PT did not observe an increase in instability during that time. Pt required verbal cues to attempt to eliminate trunk flexion during ambulation. PT discussed discharge planning with pt daughter to prepare for safe return home. PT recommended pt use lift chair and BSC while home alone. Pt daughter was agreeable to discharge plan for home. PT continues to recommend HHPT as the best option for the pt post discharge from hospital. The pt will continue to benefit from skilled PT to address remaining functional deficits.   If plan is discharge home, recommend the following: A little help with walking and/or transfers;A little help with bathing/dressing/bathroom;Assistance with cooking/housework;Assist for transportation;Help with stairs or ramp for entrance;Supervision due to cognitive status   Can travel by private vehicle        Equipment Recommendations  BSC/3in1;Rolling walker (2 wheels)    Recommendations for Other Services       Precautions / Restrictions Precautions Precautions: Fall Precaution Comments: chronic dizziness Restrictions Weight Bearing Restrictions: No     Mobility  Bed Mobility Overal bed mobility: Needs Assistance Bed Mobility: Supine to Sit, Sit to Supine      Supine to sit: Modified independent (Device/Increase time) Sit to supine: Modified independent (Device/Increase time)   General bed mobility comments: Pt needed increased time to get in/out bed. Pt observed guarding left rib area due to pain    Transfers Overall transfer level: Needs assistance Equipment used: Rolling walker (2 wheels) Transfers: Sit to/from Stand Sit to Stand: Min assist, Contact guard assist (MinA for STS from lower bed; CGA needed with STS from chair with an arm rest.)           General transfer comment: Pt required minA for initiating gait and trunk extension. CGA to minA for lowering assist when sitting.    Ambulation/Gait Ambulation/Gait assistance: Contact guard assist Gait Distance (Feet): 300 Feet Assistive device: Rolling walker (2 wheels) Gait Pattern/deviations: Step-through pattern, Decreased stride length, Trunk flexed Gait velocity: dec Gait velocity interpretation: <1.8 ft/sec, indicate of risk for recurrent falls   General Gait Details: Pt ambulates with slight trunk flexion due to pain and reports dizziness with head nods and turns to left and right.   Stairs Stairs: Yes Stairs assistance: Contact guard assist Stair Management: One rail Right, Step to pattern, Forwards Number of Stairs: 4 General stair comments: Pt required both hands on rail when descending stairs due to dizziness   Wheelchair Mobility     Tilt Bed    Modified Rankin (Stroke Patients Only)       Balance Overall balance assessment: Needs assistance Sitting-balance support: No upper extremity supported, Feet supported Sitting balance-Leahy Scale: Fair Sitting balance - Comments: EOB   Standing balance support: Bilateral upper extremity supported, During functional activity, Reliant on assistive device for balance Standing balance-Leahy  Scale: Poor Standing balance comment: forward flexion, pt tends to look down at feet due to dizziness with upright  posture, heavy reliance on RW.                            Cognition Arousal: Alert Behavior During Therapy: WFL for tasks assessed/performed Overall Cognitive Status: No family/caregiver present to determine baseline cognitive functioning Area of Impairment: Safety/judgement                         Safety/Judgement: Decreased awareness of safety              Exercises      General Comments General comments (skin integrity, edema, etc.): Pt reports dizziness been going on for ~ four months and  pt describes dizziness as spinning.      Pertinent Vitals/Pain Pain Assessment Pain Assessment: Faces Faces Pain Scale: Hurts whole lot Pain Location: L ribs Pain Descriptors / Indicators: Aching Pain Intervention(s): Monitored during session    Home Living                          Prior Function            PT Goals (current goals can now be found in the care plan section) Acute Rehab PT Goals Patient Stated Goal: Return home with support from my daughter PT Goal Formulation: With patient Time For Goal Achievement: 10/29/23 Potential to Achieve Goals: Good    Frequency    Min 1X/week      PT Plan      Co-evaluation              AM-PAC PT "6 Clicks" Mobility   Outcome Measure  Help needed turning from your back to your side while in a flat bed without using bedrails?: A Little Help needed moving from lying on your back to sitting on the side of a flat bed without using bedrails?: A Little Help needed moving to and from a bed to a chair (including a wheelchair)?: A Little Help needed standing up from a chair using your arms (e.g., wheelchair or bedside chair)?: A Little Help needed to walk in hospital room?: A Lot Help needed climbing 3-5 steps with a railing? : A Little 6 Click Score: 17    End of Session Equipment Utilized During Treatment: Gait belt Activity Tolerance: Patient limited by pain Patient left: in bed;with  call bell/phone within reach;with bed alarm set Nurse Communication: Mobility status PT Visit Diagnosis: Unsteadiness on feet (R26.81);Other abnormalities of gait and mobility (R26.89);Repeated falls (R29.6);Muscle weakness (generalized) (M62.81);History of falling (Z91.81);Pain Pain - Right/Left: Left Pain - part of body:  (Rib area)     Time: 9562-1308 PT Time Calculation (min) (ACUTE ONLY): 35 min  Charges:    $Gait Training: 23-37 mins PT General Charges $$ ACUTE PT VISIT: 1 Visit                    Caryl Comes, SPT Acute Rehabilitation Office Phone [phone]   Caryl Comes 10/18/2023, 5:26 PM

## 2023-10-18 NOTE — Discharge Summary (Signed)
[facility] Discharge Summary   Patient ID: [name] [name] MRN: [id number] DOB/AGE: 1946-03-16 77 y.o.  Admit date: 10/14/2023 Discharge date: 10/19/2023   Discharge Diagnosis Mechanical fall 10/14/23  L 5/6th rib fx  Hemopneumothorax CAD s/p CABG and DES  Hypothyroidism  CKD DM Depression GERD CKD pAFib  Dizziness   Consultants Neurology  Imaging: No results found.  Procedures None  HPI:  [name] [name] is a 77 y.o. male w/ a hx of hypothyroidism, CAD s/p CABG and DES, CKD, Depression, DM, HTN, GERD, OSA, pAFib (on Eliquis), and nephrolithiasis who presented to the ED 10/14/23 after he sustained a mechanical fall.  He got caught up on his dog's leash and wound up on the floor.  Denies LOC. Endorsing left-sided chest and back pain to palpation. CT imaging shows left 5/6th rib fractures with small hemopneumothorax.   Hospital Course: Patient was admitted to the trauma service for pain control, pulmonary toilet, and observation. Follow up chest xray stable with improved pneumothorax. Patient remained stable from a respiratory standpoint on room air.  Patient with advancing dementia and dizziness prior to admission. MRI brain obtained and was negative for acute abnormality. Neurology consulted and recommended therapies as well as outpatient neurology follow up. Patient worked with therapies during this admission who recommended home health PT. On 10/19/23 the patient was felt stable for discharge to his daughters home with her support.  Patient will follow up as below and knows to call with questions or concerns.    I have personally reviewed the patients medication history on the [place] controlled substance database.    Allergies as of 10/19/2023       Reactions   Morphine Other (See Comments)   Hallucinations   Shellfish-derived Products Itching        Medication List     STOP taking these medications    pantoprazole 40 MG tablet Commonly  known as: PROTONIX       TAKE these medications    acetaminophen 500 MG tablet Commonly known as: TYLENOL Take 2 tablets (1,000 mg total) by mouth every 6 (six) hours as needed for mild pain (pain score 1-3).   amiodarone 200 MG tablet Commonly known as: PACERONE Take 1 tablet (200 mg total) by mouth daily.   amitriptyline 10 MG tablet Commonly known as: ELAVIL Take 1-3 tablets (10-30 mg total) by mouth at bedtime.   aspirin EC 81 MG tablet Take 81 mg by mouth daily. Swallow whole.   atorvastatin 80 MG tablet Commonly known as: LIPITOR Take 1 tablet (80 mg total) by mouth daily.   docusate sodium 100 MG capsule Commonly known as: COLACE Take 1 capsule (100 mg total) by mouth 2 (two) times daily as needed for mild constipation or moderate constipation.   Eliquis 5 MG Tabs tablet Generic drug: apixaban Take 1 tablet (5 mg total) by mouth 2 (two) times daily.   Farxiga 10 MG Tabs tablet Generic drug: dapagliflozin propanediol Take 1 tablet (10 mg total) by mouth daily.   FreeStyle Libre 3 Sensor Misc Place 1 sensor on the skin every 14 days. Use to check glucose continuously   glucose blood test strip Commonly known as: Accu-Chek Aviva Use as directed to check blood sugar 4 times daily.  DX E11.9   hydrocortisone 2.5 % cream Apply 1 Application topically 2 (two) times daily.   hydrOXYzine 10 MG tablet Commonly known as: ATARAX Take 1 tablet (10 mg total) by mouth 3 (three) times daily as needed  for anxiety or itching.   insulin lispro 100 UNIT/ML KwikPen Commonly known as: HumaLOG KwikPen Inject 10 Units into the skin 3 (three) times daily. What changed:  how much to take additional instructions   Insulin Pen Needle 31G X 5 MM Misc Use as directed to inject insulin in the morning, at noon, in the evening, and at bedtime.   isosorbide mononitrate 120 MG 24 hr tablet Commonly known as: IMDUR Take 1 tablet (120 mg total) by mouth daily.   levocetirizine 5  MG tablet Commonly known as: XYZAL Take 1 tablet (5 mg total) by mouth every evening.   levothyroxine 125 MCG tablet Commonly known as: SYNTHROID Take 1 tablet (125 mcg total) by mouth daily.   lidocaine 5 % Commonly known as: LIDODERM Place 2 patches onto the skin daily. Remove & Discard patch within 12 hours or as directed by MD   memantine 10 MG tablet Commonly known as: Namenda Take 1 tablet (10 mg total) by mouth 2 (two) times daily.   nitroGLYCERIN 0.4 MG SL tablet Commonly known as: NITROSTAT PLACE 1 TABLET UNDER THE TONGUE EVERY 5 MINUTES AS NEEDED FOR CHEST PAIN.   oxyCODONE 5 MG immediate release tablet Commonly known as: Oxy IR/ROXICODONE Take 0.5 tablets (2.5 mg total) by mouth every 6 (six) hours as needed for up to 5 days for severe pain (pain score 7-10) or moderate pain (pain score 4-6).   polyethylene glycol 17 g packet Commonly known as: MIRALAX / GLYCOLAX Take 17 g by mouth daily as needed (constipation).   pramipexole 1 MG tablet Commonly known as: Mirapex Take 1/2 to 1 tablet (0.5-1 mg total) by mouth at bedtime.   QUEtiapine 25 MG tablet Commonly known as: SEROquel Take 1 tablet (25 mg total) by mouth at bedtime.   ranolazine 500 MG 12 hr tablet Commonly known as: RANEXA Take 1 tablet (500 mg total) by mouth 2 (two) times daily.   sertraline 100 MG tablet Commonly known as: ZOLOFT Take 1 tablet (100 mg total) by mouth daily.   Toujeo Max SoloStar 300 UNIT/ML Solostar Pen Generic drug: insulin glargine (2 Unit Dial) Inject 60 Units into the skin daily in the afternoon.   venlafaxine XR 75 MG 24 hr capsule Commonly known as: Effexor XR Take 1 capsule (75 mg total) by mouth daily with breakfast. What changed: Another medication with the same name was removed. Continue taking this medication, and follow the directions you see here.          Follow-up Information     Bradd Canary, MD. Schedule an appointment as soon as possible for a  visit in 1 week(s).   Specialty: Family Medicine Why: Follow up with your primary care physician 1-2 weeks after discharge Contact information: [address] [place] [address] [phone]         [facility] Follow up.   Specialty: Home Health Services Why: Agency will call you to arrange appts for home health physical therapy Contact information: [address] [place] [address] [phone]                  Signed: Clarise Cruz [facility] Surgery 10/25/2023, 10:12 AM Please see Amion for pager number during day hours 7:00am-4:30pm

## 2023-10-18 NOTE — Progress Notes (Signed)
Hypoglycemic Event  CBG: 63  Treatment: 4 oz juice/soda, pt ate breakfast  Symptoms: None  Follow-up CBG Time: 0853 CBG Result: 262  Possible Reasons for Event: Unknown  Comments/MD notified: Carlena Bjornstad, PA Instructed to admin sliding scale coverage for recheck CBG - see MAR   Merrilyn Puma, RN

## 2023-10-18 NOTE — Plan of Care (Signed)
  Problem: Pain Management: Goal: General experience of comfort will improve Outcome: Progressing   Problem: Safety: Goal: Ability to remain free from injury will improve Outcome: Progressing

## 2023-10-18 NOTE — Care Management Important Message (Signed)
Important Message  Patient Details  Name: [name] [name] MRN: [id number] Date of Birth: 26-Apr-1946   Important Message Given:  Yes - Medicare IM     Sherilyn Banker 10/18/2023, 2:31 PM

## 2023-10-19 ENCOUNTER — Other Ambulatory Visit (HOSPITAL_BASED_OUTPATIENT_CLINIC_OR_DEPARTMENT_OTHER): Payer: Self-pay

## 2023-10-19 LAB — BASIC METABOLIC PANEL
Anion gap: 7 (ref 5–15)
BUN: 15 mg/dL (ref 8–23)
CO2: 23 mmol/L (ref 22–32)
Calcium: 9 mg/dL (ref 8.9–10.3)
Chloride: 109 mmol/L (ref 98–111)
Creatinine, Ser: 1.2 mg/dL (ref 0.61–1.24)
GFR, Estimated: >60 mL/min (ref >60)
Glucose, Bld: 143 mg/dL — ABNORMAL HIGH (ref 70–99)
Potassium: 4.2 mmol/L (ref 3.5–5.1)
Sodium: 139 mmol/L (ref 135–145)

## 2023-10-19 LAB — GLUCOSE, CAPILLARY
Glucose-Capillary: 109 mg/dL — ABNORMAL HIGH (ref 70–99)
Glucose-Capillary: 163 mg/dL — ABNORMAL HIGH (ref 70–99)
Glucose-Capillary: 193 mg/dL — ABNORMAL HIGH (ref 70–99)

## 2023-10-19 MED ORDER — ACETAMINOPHEN 500 MG PO TABS: 1000.0000 mg | ORAL_TABLET | Freq: Four times a day (QID) | ORAL | Status: DC | PRN

## 2023-10-19 MED ORDER — OXYCODONE HCL 5 MG PO TABS
2.5000 mg | ORAL_TABLET | Freq: Four times a day (QID) | ORAL | 0 refills | Status: DC | PRN
  Filled 2023-10-19: qty 10, 5d supply, fill #0

## 2023-10-19 MED ORDER — DOCUSATE SODIUM 100 MG PO CAPS: 100.0000 mg | ORAL_CAPSULE | Freq: Two times a day (BID) | ORAL | Status: DC | PRN

## 2023-10-19 MED ORDER — LIDOCAINE 5 % EX PTCH: 2.0000 | MEDICATED_PATCH | CUTANEOUS | Status: DC

## 2023-10-19 MED ORDER — POLYETHYLENE GLYCOL 3350 17 G PO PACK: 17.0000 g | PACK | Freq: Every day | ORAL | Status: DC | PRN

## 2023-10-19 NOTE — TOC Transition Note (Signed)
Transition of Care [facility]) - CM/SW Discharge Note   Patient Details  Name: [name] [name] MRN: [id number] Date of Birth: 06-05-1946  Transition of Care [facility]) CM/SW Contact:  Glennon Mac, RN Phone Number: 10/19/2023, 2:19 PM   Clinical Narrative:    77 y/o M presented to the ED 11/8 after he sustained a mechanical fall. CT imaging shows left 5/6th rib fractures with small hemopneumothorax.  PTA, pt independent and living at home alone; he has support from his daughters who bring groceries and food.  PT recommending HH follow up, and patient/family agreeable to services.  Referral to [facility], as patient has used this agency in the past.  Referral to [facility] for [facility] as recommended by OT.  Spoke with [name], patient's daughter; she states patient will dc home with her, and provide needed assistance as recommended by therapies.   DC Address is [address]  [place], [place] [address] [name], daughter, phone: [phone].    Final next level of care: Home w Home Health Services Barriers to Discharge: Barriers Resolved   Patient Goals and CMS Choice CMS Medicare.gov Compare Post Acute Care list provided to:: Patient Represenative (must comment) (daughter [name]) Choice offered to / list presented to : Adult Children                        Discharge Plan and Services Additional resources added to the After Visit Summary for     Discharge Planning Services: CM Consult Post Acute Care Choice: Home Health          DME Arranged: Bedside commode DME Agency: [facility] Date DME Agency Contacted: 10/19/23 Time DME Agency Contacted: 1237 Representative spoke with at DME Agency: Mickeal Needy HH Arranged: PT HH Agency: [facility] Date [facility] Agency Contacted: 10/19/23 Time HH Agency Contacted: 1418 Representative spoke with at [facility] Agency: Hassel Neth  Social Determinants of Health (SDOH) Interventions SDOH Screenings   Food Insecurity: No Food  Insecurity (10/15/2023)  Housing: Low Risk  (10/15/2023)  Transportation Needs: No Transportation Needs (10/15/2023)  Utilities: Not At Risk (10/15/2023)  Alcohol Screen: Low Risk  (05/30/2023)  Depression (PHQ2-9): Low Risk  (09/01/2023)  Financial Resource Strain: Low Risk  (05/24/2023)  Physical Activity: Insufficiently Active (08/02/2022)  Social Connections: Moderately Isolated (08/02/2022)  Stress: No Stress Concern Present (05/30/2023)  Tobacco Use: Low Risk  (10/14/2023)     Readmission Risk Interventions     No data to display         Quintella Baton, RN, BSN  Trauma/Neuro ICU Case Manager [phone]

## 2023-10-19 NOTE — Progress Notes (Signed)
Patient ID: [name] [name], male   DOB: 05-24-1946, 77 y.o.   MRN: [id number] [facility] Surgery Progress Note     Subjective: CC-  Daughter at bedside.  Patient still with some pain and dizziness with movement but good pain control at rest and no SHOB. Eating/drinking well. Worked with PT yesterday and daughter will be able to provide support on discharge. He will not have 24 hour care but patient and daughter desire for return to home and he will have good support overall  Objective: Vital signs in last 24 hours: Temp:  [97.5 F (36.4 C)-98.1 F (36.7 C)] 97.5 F (36.4 C) (11/13 0832) Pulse Rate:  [60-72] 60 (11/13 0832) Resp:  [12-18] 18 (11/13 0832) BP: (115-140)/(58-68) 136/63 (11/13 0832) SpO2:  [95 %-97 %] 97 % (11/13 0832) Last BM Date : 10/17/23  Intake/Output from previous day: 11/12 0701 - 11/13 0700 In: 1320 [P.O.:1320] Out: 240 [Urine:240] Intake/Output this shift: No intake/output data recorded.  PE: Gen:  Alert, NAD, pleasant HEENT: EOM's intact, pupils equal and round Card:  RRR Pulm:  CTAB, no W/R/R, rate and effort normal on room air Abd: Soft, NT/ND Psych: A&Ox4  Skin: no rashes noted, warm and dry  Lab Results:  Recent Labs    10/17/23 0725 10/18/23 0628  WBC 7.9 7.9  HGB 12.5* 12.6*  HCT 37.7* 36.8*  PLT 150 150   BMET Recent Labs    10/17/23 0725 10/18/23 0628  NA 139 139  K 3.7 3.4*  CL 107 106  CO2 23 25  GLUCOSE 128* 76  BUN 16 16  CREATININE 1.31* 1.40*  CALCIUM 8.5* 8.7*   PT/INR No results for input(s): "LABPROT", "INR" in the last 72 hours. CMP     Component Value Date/Time   NA 139 10/18/2023 0628   NA 134 10/23/2020 1054   K 3.4 (L) 10/18/2023 0628   CL 106 10/18/2023 0628   CO2 25 10/18/2023 0628   GLUCOSE 76 10/18/2023 0628   BUN 16 10/18/2023 0628   BUN 22 10/23/2020 1054   CREATININE 1.40 (H) 10/18/2023 0628   CREATININE 2.02 (H) 09/15/2020 0855   CALCIUM 8.7 (L) 10/18/2023 0628   PROT 6.5  10/14/2023 1949   PROT 6.8 09/27/2017 0823   ALBUMIN 3.8 10/14/2023 1949   ALBUMIN 4.4 09/27/2017 0823   AST 44 (H) 10/14/2023 1949   ALT 53 (H) 10/14/2023 1949   ALKPHOS 77 10/14/2023 1949   BILITOT 0.7 10/14/2023 1949   BILITOT 0.6 09/27/2017 0823   GFRNONAA 52 (L) 10/18/2023 0628   GFRAA 38 (L) 10/23/2020 1054   Lipase     Component Value Date/Time   LIPASE 25 02/13/2022 0049       Studies/Results: DG CHEST PORT 1 VIEW  Result Date: 10/17/2023 CLINICAL DATA:  Pneumothorax EXAM: PORTABLE CHEST 1 VIEW COMPARISON:  Chest x-ray 10/16/2023.  Chest CT 10/14/2023. FINDINGS: Small left apical pneumothorax appears unchanged from the prior exam. There are bands of atelectasis in both lung bases similar to prior. There is no pleural effusion. The cardiomediastinal silhouette is within normal limits. Patient is status post cardiac surgery. The osseous structures are stable. IMPRESSION: 1. Small left apical pneumothorax appears unchanged from the prior exam. 2. Bands of atelectasis in both lung bases similar to prior. Electronically Signed   By: Darliss Cheney [name]   On: 10/17/2023 15:54    Anti-infectives: Anti-infectives (From admission, onward)    None        Assessment/Plan 77 y/o  M with multiple comorbidity who presented after a mechanical fall 10/14/23 and has a CT showing left-sided rib fx and hemopneumothorax   L 5/6th rib fx - pain control, pulm toilet, IS. Reorder scheduled robaxin. Oxy 2.5-5mg  PRN Hemopneumothorax - CXR 11/11 with stable PTX.  Continue IS and ambulation.     CAD s/p CABG and DES (POA) - Home meds, cardiac monitor Hypothyroidism (POA) - Home synthroid CKD - stage III DM (POA) - adding back home meds.  Giving lower doses of his long acting insulin to decrease risk of hypoglycemia at first. More significant hyperglycemia over last 24 hours and can resume home dose humalog on dc Depression (POA) - continue home meds GERD (POA)  - Protonix CKD (POA) - Avoid  nephrotoxic meds, monitor UOP. Cr stable at 1.4 pAFib (POA) - cardiac monitor, holding Eliquis - can resume on dc Dizziness - neuro consulted and signed off. MRI brain negative. Has orthostatic hypotension. PT reccs neuro-vestibular rehab specialist at dc   FEN - Regular VTE - lovenox, resume eliquis at dc ID - None indicated    Dispo- med/surg,  Patient does pretty well, but has advancing dementia and dizziness that impairs his ability to care for himself. Patient worked with PT yesterday with daughter present and patient overall safe for home with good family support and HH PT. We discussed fall prevention and the risk for worsening mental function and risk for future falls. Discharge today  I reviewed last 24 h vitals and pain scores, last 48 h intake and output, last 24 h labs and trends, and last 24 h imaging results.    LOS: 4 days    Eric Form, [facility] 10/19/2023, 8:50 AM Please see Amion for pager number during day hours 7:00am-4:30pm,

## 2023-10-19 NOTE — TOC CM/SW Note (Signed)
Patient confined to one room and can't get to bathroom therefore needs bedside commode.

## 2023-10-19 NOTE — Progress Notes (Addendum)
PHARMACY - ANTICOAGULATION CONSULT NOTE  Pharmacy Consult for Lovenox Indication: VTE prophylaxis  Allergies  Allergen Reactions   Morphine Other (See Comments)    Hallucinations   Shellfish-Derived Products Itching    Patient Measurements: Height: 5\' 8"  (172.7 cm) Weight: 75.8 kg (167 lb 1.7 oz) IBW/kg (Calculated) : 68.4  Vital Signs: Temp: 97.5 F (36.4 C) (11/13 0832) Temp Source: Oral (11/13 0514) BP: 136/63 (11/13 0832) Pulse Rate: 60 (11/13 0832)  Labs: Recent Labs    10/17/23 0725 10/18/23 0628 10/19/23 1008  HGB 12.5* 12.6*  --   HCT 37.7* 36.8*  --   PLT 150 150  --   CREATININE 1.31* 1.40* 1.20    Estimated Creatinine Clearance: 51.5 mL/min (by C-G formula based on SCr of 1.2 mg/dL).   Medical History: Past Medical History:  Diagnosis Date   Acquired atrophy of thyroid 06/05/2013   Overview:  July 2014: controlled on synthroid since 2012 May 2015: decreased to Synthroid 50 mcg  Last Assessment & Plan:  Pt doing well with most recent TSH within normal range.  Will continue current dosage of Synthroid . Pt reminded to take medication on empty stomach. Will continue to monitor with periodic laboratory assessment in 3 months.    Anemia    hemoglobin 7.4, iron deficiency, January, 2011, 2 unit transfusion, endoscopy normal, capsule endoscopy February, 2011 no small bowel abnormalities.   Most likely source gastric erosions, followed by GI   Anxiety    Bradycardia    CAD (coronary artery disease)    A. CABG in 2000,status post cardiac cath in 2006, 2009 ....continued chest pain and SOB despite oral medication adjestments including Ranexa. B. Cath November 2009/ mRCA - 2.75 x 23 Abbott Xience V drug-eluting stent ...11/26/2008 to distal  RCA leading to acute marginal.  C. Cath 07/2012 for CP - stable anatomy, med rx. d. cath 2015 and 05/30/2015 stable anatomy, consider Myoview if has CP again   Carotid artery disease (HCC) 08/01/2015   Doppler, May 29, 2015, 1-39% bilateral ICA    Cerebral ischemia    MRI November, 2010, chronic microvascular ischemia   CKD (chronic kidney disease), stage III (HCC)    Concussion    Depression    Bipolar   Diabetes mellitus (HCC)    Edema    Essential hypertension 06/05/2013   Overview:  July 2014: Controlled with Bystolic Sep 2015: Changed to atenolol.  Last Assessment & Plan:  Will change to atenolol for cost and follow Improved.  Medication compliance strongly encouraged BP: 116/64 mmHg    Overview:  July 2014: Controlled with Bystolic Sep 2015: Changed to atenolol.  Last Assessment & Plan:  Will change to atenolol for cost and follow   Falling episodes    these have occurred in the past and again recurring 2011   Family history of adverse reaction to anesthesia    "mother died during bypass surgery but not sure if it has to do with anesthesia"   Gastric ulcer    GERD (gastroesophageal reflux disease)    H/O medication noncompliance    Due to loss of insurance   H/O multiple concussions    Hard of hearing    Heart murmur    History of blood transfusion 12/20/2013   History of kidney stones    Hx of CABG    2000,  / one median sternotomy suture broken her chest x-ray November, 2010, no clinical significance   Hyperlipidemia    Hypertension  pt. denies   Iron deficiency anemia    Long term (current) use of anticoagulants [Z79.01] 07/20/2016   Low back pain 06/12/2009   Qualifier: Diagnosis of  By: Nena Jordan    Nephrolithiasis    Orthostasis    OSA (obstructive sleep apnea)    PAF (paroxysmal atrial fibrillation) (HCC)    a. dx 2017, started on amiodarone/warfarin but patient intermittently noncompliant.   PSVT (paroxysmal supraventricular tachycardia) (HCC)    RBBB 07/09/2009   Qualifier: Diagnosis of  By: Myrtis Ser, MD, [facility], Lemmie Evens    RLS (restless legs syndrome) 09/19/2009   Qualifier: Diagnosis of  By: Nena Jordan   Overview:  July 2014: Controlled with Mirapex  Last  Assessment & Plan:  Patient is doing well. Will continue current management and follow clinically.   Seizure disorder (HCC) 01/09/2017   Spondylosis    C5-6, C6-7 MRI 2010   Syncope 03/2016   TBI (traumatic brain injury) (HCC) 2019   Thyroid disease    Tubulovillous adenoma of colon 2007   Vitamin D deficiency 05/17/2017   Wears glasses      Assessment: 77 y/o M with multiple comorbidity who presented after a mechanical fall 10/14/23 and has a CT showing left-sided rib fx and hemopneumothorax .  Pharmacy consulted for Lovenox dosing for AKI.    Scr improving at 1.2, CrCl ~51 ml/min. CBC stable. No bleeding reported.   Plan:  Continue Lovenox 30 mg subcutaneous q12hr - F/up to transition back to home Eliquis.  Monitor Scr and UOP closely, and adjust if CrCl < 30 ml/min  Link Snuffer, PharmD, BCPS, BCCCP Please refer to [facility] for [facility] Pharmacy numbers 10/19/2023, 3:01 PM

## 2023-10-20 ENCOUNTER — Telehealth: Payer: Self-pay

## 2023-10-20 NOTE — Transitions of Care (Post Inpatient/ED Visit) (Signed)
   10/20/2023  Name: ANISH LAESSIG MRN: [id number] DOB: Jun 14, 1946  Today's TOC FU Call Status: Today's TOC FU Call Status:: Unsuccessful Call (1st Attempt) Unsuccessful Call (1st Attempt) Date: 10/20/23  Attempted to reach the patient regarding the most recent Inpatient/ED visit.  Follow Up Plan: Additional outreach attempts will be made to reach the patient to complete the Transitions of Care (Post Inpatient/ED visit) call.    Quasim Doyon, CMA  [facility] AW[facility] Team Direct Dial: [phone]

## 2023-10-20 NOTE — Transitions of Care (Post Inpatient/ED Visit) (Signed)
   10/20/2023  Name: ALAMEEN MISENCIK MRN: [id number] DOB: Jul 23, 1946  Today's TOC FU Call Status: Today's TOC FU Call Status:: Unsuccessful Call (2nd Attempt) Unsuccessful Call (1st Attempt) Date: 10/20/23 Unsuccessful Call (2nd Attempt) Date: 10/20/23  Attempted to reach the patient regarding the most recent Inpatient/ED visit.  Follow Up Plan: Additional outreach attempts will be made to reach the patient to complete the Transitions of Care (Post Inpatient/ED visit) call.    Kenny Stern, CMA  [facility] Team Direct Dial: [phone]

## 2023-10-24 DIAGNOSIS — S2242XD Multiple fractures of ribs, left side, subsequent encounter for fracture with routine healing: Secondary | ICD-10-CM | POA: Diagnosis not present

## 2023-10-24 DIAGNOSIS — Z951 Presence of aortocoronary bypass graft: Secondary | ICD-10-CM | POA: Diagnosis not present

## 2023-10-24 DIAGNOSIS — K219 Gastro-esophageal reflux disease without esophagitis: Secondary | ICD-10-CM | POA: Diagnosis not present

## 2023-10-24 DIAGNOSIS — D509 Iron deficiency anemia, unspecified: Secondary | ICD-10-CM | POA: Diagnosis not present

## 2023-10-24 DIAGNOSIS — Z794 Long term (current) use of insulin: Secondary | ICD-10-CM | POA: Diagnosis not present

## 2023-10-24 DIAGNOSIS — I2510 Atherosclerotic heart disease of native coronary artery without angina pectoris: Secondary | ICD-10-CM | POA: Diagnosis not present

## 2023-10-24 DIAGNOSIS — Z7982 Long term (current) use of aspirin: Secondary | ICD-10-CM | POA: Diagnosis not present

## 2023-10-24 DIAGNOSIS — Z7901 Long term (current) use of anticoagulants: Secondary | ICD-10-CM | POA: Diagnosis not present

## 2023-10-24 DIAGNOSIS — Z79899 Other long term (current) drug therapy: Secondary | ICD-10-CM | POA: Diagnosis not present

## 2023-10-24 DIAGNOSIS — G4733 Obstructive sleep apnea (adult) (pediatric): Secondary | ICD-10-CM | POA: Diagnosis not present

## 2023-10-24 DIAGNOSIS — I480 Paroxysmal atrial fibrillation: Secondary | ICD-10-CM | POA: Diagnosis not present

## 2023-10-24 DIAGNOSIS — I129 Hypertensive chronic kidney disease with stage 1 through stage 4 chronic kidney disease, or unspecified chronic kidney disease: Secondary | ICD-10-CM | POA: Diagnosis not present

## 2023-10-24 DIAGNOSIS — E559 Vitamin D deficiency, unspecified: Secondary | ICD-10-CM | POA: Diagnosis not present

## 2023-10-24 DIAGNOSIS — D631 Anemia in chronic kidney disease: Secondary | ICD-10-CM | POA: Diagnosis not present

## 2023-10-24 DIAGNOSIS — Z7984 Long term (current) use of oral hypoglycemic drugs: Secondary | ICD-10-CM | POA: Diagnosis not present

## 2023-10-24 DIAGNOSIS — N183 Chronic kidney disease, stage 3 unspecified: Secondary | ICD-10-CM | POA: Diagnosis not present

## 2023-10-24 DIAGNOSIS — H9190 Unspecified hearing loss, unspecified ear: Secondary | ICD-10-CM | POA: Diagnosis not present

## 2023-10-24 DIAGNOSIS — E039 Hypothyroidism, unspecified: Secondary | ICD-10-CM | POA: Diagnosis not present

## 2023-10-24 DIAGNOSIS — G2581 Restless legs syndrome: Secondary | ICD-10-CM | POA: Diagnosis not present

## 2023-10-24 DIAGNOSIS — Z9181 History of falling: Secondary | ICD-10-CM | POA: Diagnosis not present

## 2023-10-24 DIAGNOSIS — E1122 Type 2 diabetes mellitus with diabetic chronic kidney disease: Secondary | ICD-10-CM | POA: Diagnosis not present

## 2023-10-25 ENCOUNTER — Telehealth: Payer: Self-pay | Admitting: Family Medicine

## 2023-10-25 ENCOUNTER — Other Ambulatory Visit: Payer: Self-pay | Admitting: Family Medicine

## 2023-10-25 MED ORDER — OXYCODONE HCL 5 MG PO TABS: 2.5000 mg | ORAL_TABLET | Freq: Four times a day (QID) | ORAL | 0 refills | Status: AC | PRN

## 2023-10-25 NOTE — Telephone Encounter (Signed)
Pt daughter calling to see if Dr. Abner Greenspan can call in some more oxycodone 5 mg in. He has no more but sees blyth this Friday. Please give her a call if there is an issue. [facility] to [facility] [address] .

## 2023-10-25 NOTE — Telephone Encounter (Signed)
Caller/Agency: [facility]  Callback Number: [phone] Requesting OT/PT/Skilled Nursing/Social Work/Speech Therapy: PT  Frequency: 1x1 , 2x4, 1x4

## 2023-10-26 NOTE — Telephone Encounter (Signed)
Pt daughter notified rx has been sent in.

## 2023-10-26 NOTE — Telephone Encounter (Signed)
[name] [name] can you please make note of this when he sees you on Friday for HFU.  Verbal orders given to [place].

## 2023-10-27 ENCOUNTER — Other Ambulatory Visit (HOSPITAL_BASED_OUTPATIENT_CLINIC_OR_DEPARTMENT_OTHER): Payer: Self-pay

## 2023-10-28 ENCOUNTER — Ambulatory Visit: Payer: Medicare Other | Admitting: Physician Assistant

## 2023-10-28 ENCOUNTER — Telehealth: Payer: Self-pay | Admitting: *Deleted

## 2023-10-28 ENCOUNTER — Encounter: Payer: Self-pay | Admitting: Physician Assistant

## 2023-10-28 ENCOUNTER — Other Ambulatory Visit: Payer: Self-pay

## 2023-10-28 VITALS — BP 138/78 | HR 78 | Temp 97.9°F | Ht 68.0 in | Wt 167.4 lb

## 2023-10-28 DIAGNOSIS — S2242XA Multiple fractures of ribs, left side, initial encounter for closed fracture: Secondary | ICD-10-CM | POA: Diagnosis not present

## 2023-10-28 DIAGNOSIS — K529 Noninfective gastroenteritis and colitis, unspecified: Secondary | ICD-10-CM | POA: Diagnosis not present

## 2023-10-28 DIAGNOSIS — F0390 Unspecified dementia without behavioral disturbance: Secondary | ICD-10-CM | POA: Diagnosis not present

## 2023-10-28 DIAGNOSIS — J942 Hemothorax: Secondary | ICD-10-CM

## 2023-10-28 DIAGNOSIS — Z9181 History of falling: Secondary | ICD-10-CM | POA: Diagnosis not present

## 2023-10-28 DIAGNOSIS — R32 Unspecified urinary incontinence: Secondary | ICD-10-CM

## 2023-10-28 NOTE — Progress Notes (Signed)
Established patient visit   Patient: [name] [name]   DOB: [birth year]   77 y.o. Male  MRN: [id number] Visit Date: 10/28/2023  Today's healthcare provider: Alfredia Ferguson, PA-C   Cc. Hospital f/u  Subjective     Pt presents today for a hospital follow up, with daughter. Pt was admitted from 11/8-11/13 for a mechanical fall. CT demonstrated left 5/6 rib fractures and a small hemopneumothorax. MRI brain negative. Discharged with home PT.  Discussed the use of AI scribe software for clinical note transcription with the patient, who gave verbal consent to proceed.  History of Present Illness   The patient, with a history of forgetfulness and recent hospitalization due to a fall, presents with concerns about bowel urgency and occasional incontinence. The patient reports that these symptoms have been ongoing for a while, with a temporary improvement during the hospital stay, but have since returned. The patient denies any abdominal pain but has been experiencing diarrhea, which he reports started before the hospitalization. The patient is unsure of the cause of the diarrhea as he does not have a cold or any other apparent illness.  The patient is currently on pain medication for this. The patient and his family member are considering home health care to assist with daily activities and ensure safety, as the patient has expressed a desire to live independently.   Medications: Outpatient Medications Prior to Visit  Medication Sig   acetaminophen (TYLENOL) 500 MG tablet Take 2 tablets (1,000 mg total) by mouth every 6 (six) hours as needed for mild pain (pain score 1-3).   amiodarone (PACERONE) 200 MG tablet Take 1 tablet (200 mg total) by mouth daily.   amitriptyline (ELAVIL) 10 MG tablet Take 1-3 tablets (10-30 mg total) by mouth at bedtime.   apixaban (ELIQUIS) 5 MG TABS tablet Take 1 tablet (5 mg total) by mouth 2 (two) times daily.   aspirin EC 81 MG tablet Take 81 mg by mouth  daily. Swallow whole.   atorvastatin (LIPITOR) 80 MG tablet Take 1 tablet (80 mg total) by mouth daily.   Continuous Glucose Sensor (FREESTYLE LIBRE 3 SENSOR) MISC Place 1 sensor on the skin every 14 days. Use to check glucose continuously   dapagliflozin propanediol (FARXIGA) 10 MG TABS tablet Take 1 tablet (10 mg total) by mouth daily.   docusate sodium (COLACE) 100 MG capsule Take 1 capsule (100 mg total) by mouth 2 (two) times daily as needed for mild constipation or moderate constipation.   glucose blood (ACCU-CHEK AVIVA) test strip Use as directed to check blood sugar 4 times daily.  DX E11.9   hydrocortisone 2.5 % cream Apply 1 Application topically 2 (two) times daily.   hydrOXYzine (ATARAX) 10 MG tablet Take 1 tablet (10 mg total) by mouth 3 (three) times daily as needed for anxiety or itching.   insulin glargine, 2 Unit Dial, (TOUJEO MAX SOLOSTAR) 300 UNIT/ML Solostar Pen Inject 60 Units into the skin daily in the afternoon.   insulin lispro (HUMALOG KWIKPEN) 100 UNIT/ML KwikPen Inject 10 Units into the skin 3 (three) times daily. (Patient taking differently: Inject 12 Units into the skin 3 (three) times daily. Plus correction blood glucose- 130 / 25)   Insulin Pen Needle 31G X 5 MM MISC Use as directed to inject insulin in the morning, at noon, in the evening, and at bedtime.   isosorbide mononitrate (IMDUR) 120 MG 24 hr tablet Take 1 tablet (120 mg total) by mouth daily.  levocetirizine (XYZAL) 5 MG tablet Take 1 tablet (5 mg total) by mouth every evening.   levothyroxine (SYNTHROID) 125 MCG tablet Take 1 tablet (125 mcg total) by mouth daily.   lidocaine (LIDODERM) 5 % Place 2 patches onto the skin daily. Remove & Discard patch within 12 hours or as directed by MD   memantine (NAMENDA) 10 MG tablet Take 1 tablet (10 mg total) by mouth 2 (two) times daily.   nitroGLYCERIN (NITROSTAT) 0.4 MG SL tablet PLACE 1 TABLET UNDER THE TONGUE EVERY 5 MINUTES AS NEEDED FOR CHEST PAIN.   oxyCODONE  (OXY IR/ROXICODONE) 5 MG immediate release tablet Take 0.5 tablets (2.5 mg total) by mouth every 6 (six) hours as needed for up to 5 days for severe pain (pain score 7-10) or moderate pain (pain score 4-6).   polyethylene glycol (MIRALAX / GLYCOLAX) 17 g packet Take 17 g by mouth daily as needed (constipation).   pramipexole (MIRAPEX) 1 MG tablet Take 1/2 to 1 tablet (0.5-1 mg total) by mouth at bedtime.   QUEtiapine (SEROQUEL) 25 MG tablet Take 1 tablet (25 mg total) by mouth at bedtime.   ranolazine (RANEXA) 500 MG 12 hr tablet Take 1 tablet (500 mg total) by mouth 2 (two) times daily.   sertraline (ZOLOFT) 100 MG tablet Take 1 tablet (100 mg total) by mouth daily.   venlafaxine XR (EFFEXOR XR) 75 MG 24 hr capsule Take 1 capsule (75 mg total) by mouth daily with breakfast.   No facility-administered medications prior to visit.    Review of Systems  Constitutional:  Negative for fatigue and fever.  Respiratory:  Negative for cough and shortness of breath.   Cardiovascular:  Negative for chest pain, palpitations and leg swelling.  Neurological:  Negative for dizziness and headaches.       Objective    BP 138/78   Pulse 78   Temp 97.9 F (36.6 C) (Oral)   Ht 5\' 8"  (1.727 m)   Wt 167 lb 6 oz (75.9 kg)   SpO2 97%   BMI 25.45 kg/m    Physical Exam Constitutional:      General: He is awake.     Appearance: He is well-developed.  HENT:     Head: Normocephalic.  Eyes:     Conjunctiva/sclera: Conjunctivae normal.  Cardiovascular:     Rate and Rhythm: Normal rate.  Pulmonary:     Effort: Pulmonary effort is normal.     Breath sounds: Normal breath sounds.  Skin:    General: Skin is warm.  Neurological:     Mental Status: He is alert and oriented to person, place, and time.  Psychiatric:        Attention and Perception: Attention normal.        Mood and Affect: Mood normal.        Speech: Speech normal.        Behavior: Behavior is cooperative.     No results found for  any visits on 10/28/23.  Assessment & Plan    Dementia without behavioral disturbance, psychotic disturbance, mood disturbance, or anxiety, unspecified dementia severity, unspecified dementia type (HCC) -     AMB Referral VBCI Care Management At risk for falls -     AMB Referral VBCI Care Management  Closed fracture of multiple ribs of left side, initial encounter Hemopneumothorax - Continue oxycodone as needed for pain - Encourage use of incentive spirometer - Monitor pain levels and adjust medication as necessary  Urinary incontinence, unspecified type -  Monitor urinary symptoms - Consider urology referral if symptoms persist or worsen  Chronic diarrhea Recommending increasing fluids and taking a fiber supplement.   Post-Hospitalization Care and Home Health Needs Recently hospitalized after a fall. Currently with family but wishes to return to independent living. Family concerned about ability to manage daily activities and prevent falls. Discussed home health services, physical therapy, and home safety modifications. - Coordinate with care team for home health services - Arrange physical therapy for mobility and fall prevention -- pt already approved from home PT to be managed by PCP - Discuss home safety modifications - Consider home health aide for daily assistance  Return if symptoms worsen or fail to improve.       Alfredia Ferguson, PA-C  [facility] [phone] (phone) [phone] (fax)  [facility]

## 2023-10-28 NOTE — Progress Notes (Signed)
  Care Coordination   Note   10/28/2023 Name: CHANTHA MURAD MRN: [id number] DOB: [birth year]  Nicole Kindred Winch is a 77 y.o. year old male who sees Bradd Canary, MD for primary care. I reached out to Arcelia Jew by phone today to offer care coordination services.  Mr. Gadaleta was given information about Care Coordination services today including:   The Care Coordination services include support from the care team which includes your Nurse Coordinator, Clinical Social Worker, or Pharmacist.  The Care Coordination team is here to help remove barriers to the health concerns and goals most important to you. Care Coordination services are voluntary, and the patient may decline or stop services at any time by request to their care team member.   Care Coordination Consent Status: Patient agreed to services and verbal consent obtained.   Follow up plan:  Telephone appointment with care coordination team member scheduled for:  11/01/2023  Encounter Outcome:  Patient Scheduled from referral   Burman Nieves, [facility] Care Coordination Care Guide Direct Dial: [phone]

## 2023-10-28 NOTE — Telephone Encounter (Signed)
Noted mentioned in ov today

## 2023-10-31 ENCOUNTER — Other Ambulatory Visit (HOSPITAL_BASED_OUTPATIENT_CLINIC_OR_DEPARTMENT_OTHER): Payer: Self-pay

## 2023-10-31 ENCOUNTER — Encounter: Payer: Self-pay | Admitting: Neurology

## 2023-10-31 ENCOUNTER — Other Ambulatory Visit: Payer: Self-pay

## 2023-10-31 ENCOUNTER — Other Ambulatory Visit: Payer: Self-pay | Admitting: Family Medicine

## 2023-10-31 ENCOUNTER — Encounter: Payer: Self-pay | Admitting: Family Medicine

## 2023-10-31 DIAGNOSIS — L308 Other specified dermatitis: Secondary | ICD-10-CM

## 2023-10-31 DIAGNOSIS — F419 Anxiety disorder, unspecified: Secondary | ICD-10-CM

## 2023-10-31 DIAGNOSIS — G4700 Insomnia, unspecified: Secondary | ICD-10-CM

## 2023-10-31 DIAGNOSIS — F32A Depression, unspecified: Secondary | ICD-10-CM

## 2023-10-31 MED ORDER — LEVOCETIRIZINE DIHYDROCHLORIDE 5 MG PO TABS
5.0000 mg | ORAL_TABLET | Freq: Every evening | ORAL | 2 refills | Status: DC
  Filled 2023-10-31: qty 30, 30d supply, fill #0
  Filled 2023-12-05: qty 30, 30d supply, fill #1
  Filled 2023-12-28 – 2024-01-06 (×5): qty 30, 30d supply, fill #2

## 2023-10-31 MED ORDER — AMITRIPTYLINE HCL 10 MG PO TABS
10.0000 mg | ORAL_TABLET | Freq: Every day | ORAL | 1 refills | Status: DC
Start: 2023-10-31 — End: 2023-12-21
  Filled 2023-10-31: qty 90, 30d supply, fill #0
  Filled 2023-12-05: qty 90, 30d supply, fill #1

## 2023-11-01 ENCOUNTER — Other Ambulatory Visit: Payer: Self-pay | Admitting: Family Medicine

## 2023-11-01 ENCOUNTER — Ambulatory Visit: Payer: Self-pay | Admitting: Licensed Clinical Social Worker

## 2023-11-01 MED ORDER — OXYCODONE HCL 5 MG PO TABS
5.0000 mg | ORAL_TABLET | Freq: Two times a day (BID) | ORAL | 0 refills | Status: DC | PRN
  Filled 2023-11-01: qty 40, 20d supply, fill #0

## 2023-11-01 NOTE — Telephone Encounter (Signed)
Requesting:Oxycodone IR 5mg  Contract:No UDS:NO Last Visit:09/08/2023 Next Visit:None Last Refill:10/25/2023  Please Advise

## 2023-11-01 NOTE — Telephone Encounter (Signed)
PCP or [place].

## 2023-11-01 NOTE — Patient Outreach (Signed)
  Care Coordination   Initial Visit Note   11/01/2023 Name: [name] [name] MRN: [id number] DOB: May 02, 1946  [name] [name] is a 77 y.o. year old male who sees [name] Canary, MD for primary care. I spoke with  [name] [name]  [name] [name] by phone today.  What matters to the patients health and wellness today?  LCSW A Felton Clinton provided caregiver support and provided education on resources.     Goals Addressed             This Visit's Progress    Care Cooridnation       Activities and task to complete in order to accomplish goals.   Call your insurance provider for more information about your Enhanced Benefits (for pca servicesl) Review private pay home care options provided and discussed Complete Advance Directive packet,  Have advance directive notarized and provide a copy to provider office          SDOH assessments and interventions completed:  Yes  SDOH Interventions Today    Flowsheet Row Most Recent Value  SDOH Interventions   Food Insecurity Interventions Intervention Not Indicated  Housing Interventions Intervention Not Indicated  Transportation Interventions Intervention Not Indicated, Patient Resources (Friends/Family)  Utilities Interventions Intervention Not Indicated        Care Coordination Interventions:  Yes, provided   Interventions Today    Flowsheet Row Most Recent Value  Chronic Disease   Chronic disease during today's visit Other  [Pt [name] reports [name] [name] dx wiht dementia Jan 2024]  General Interventions   General Interventions Discussed/Reviewed Level of Care  Level of Care Personal Care Services  [[name] [name] is requesting help with pca services to assist with adl's]  Pharmacy Interventions   Pharmacy Dicussed/Reviewed Medication Adherence  [[name] [name] [name] ensures dad takes medication]  Safety Interventions   Safety Discussed/Reviewed Safety Reviewed, Fall Risk  [3-4 weeks ago [name] fell and  broke ribs.]  Advanced Directive Interventions   Advanced Directives Discussed/Reviewed Advanced Directives Discussed        Follow up plan: Follow up call scheduled for 11/07/2023    Encounter Outcome:  Patient Visit Completed   Gwyndolyn Saxon MSW, LCSW Licensed Clinical Social Worker  [facility], Population Health Direct Dial: [phone]  Fax: [phone]

## 2023-11-02 ENCOUNTER — Other Ambulatory Visit: Payer: Self-pay | Admitting: Family Medicine

## 2023-11-02 ENCOUNTER — Other Ambulatory Visit (HOSPITAL_BASED_OUTPATIENT_CLINIC_OR_DEPARTMENT_OTHER): Payer: Self-pay

## 2023-11-02 DIAGNOSIS — Z79899 Other long term (current) drug therapy: Secondary | ICD-10-CM | POA: Diagnosis not present

## 2023-11-02 DIAGNOSIS — G4733 Obstructive sleep apnea (adult) (pediatric): Secondary | ICD-10-CM | POA: Diagnosis not present

## 2023-11-02 DIAGNOSIS — S2242XD Multiple fractures of ribs, left side, subsequent encounter for fracture with routine healing: Secondary | ICD-10-CM | POA: Diagnosis not present

## 2023-11-02 DIAGNOSIS — K219 Gastro-esophageal reflux disease without esophagitis: Secondary | ICD-10-CM | POA: Diagnosis not present

## 2023-11-02 DIAGNOSIS — Z9181 History of falling: Secondary | ICD-10-CM | POA: Diagnosis not present

## 2023-11-02 DIAGNOSIS — E559 Vitamin D deficiency, unspecified: Secondary | ICD-10-CM | POA: Diagnosis not present

## 2023-11-02 DIAGNOSIS — I129 Hypertensive chronic kidney disease with stage 1 through stage 4 chronic kidney disease, or unspecified chronic kidney disease: Secondary | ICD-10-CM | POA: Diagnosis not present

## 2023-11-02 DIAGNOSIS — G2581 Restless legs syndrome: Secondary | ICD-10-CM | POA: Diagnosis not present

## 2023-11-02 DIAGNOSIS — E039 Hypothyroidism, unspecified: Secondary | ICD-10-CM | POA: Diagnosis not present

## 2023-11-02 DIAGNOSIS — I2510 Atherosclerotic heart disease of native coronary artery without angina pectoris: Secondary | ICD-10-CM | POA: Diagnosis not present

## 2023-11-02 DIAGNOSIS — D509 Iron deficiency anemia, unspecified: Secondary | ICD-10-CM | POA: Diagnosis not present

## 2023-11-02 DIAGNOSIS — Z7982 Long term (current) use of aspirin: Secondary | ICD-10-CM | POA: Diagnosis not present

## 2023-11-02 DIAGNOSIS — Z794 Long term (current) use of insulin: Secondary | ICD-10-CM | POA: Diagnosis not present

## 2023-11-02 DIAGNOSIS — Z951 Presence of aortocoronary bypass graft: Secondary | ICD-10-CM | POA: Diagnosis not present

## 2023-11-02 DIAGNOSIS — Z7984 Long term (current) use of oral hypoglycemic drugs: Secondary | ICD-10-CM | POA: Diagnosis not present

## 2023-11-02 DIAGNOSIS — D631 Anemia in chronic kidney disease: Secondary | ICD-10-CM | POA: Diagnosis not present

## 2023-11-02 DIAGNOSIS — E1122 Type 2 diabetes mellitus with diabetic chronic kidney disease: Secondary | ICD-10-CM | POA: Diagnosis not present

## 2023-11-02 DIAGNOSIS — I480 Paroxysmal atrial fibrillation: Secondary | ICD-10-CM | POA: Diagnosis not present

## 2023-11-02 DIAGNOSIS — N183 Chronic kidney disease, stage 3 unspecified: Secondary | ICD-10-CM | POA: Diagnosis not present

## 2023-11-02 DIAGNOSIS — Z7901 Long term (current) use of anticoagulants: Secondary | ICD-10-CM | POA: Diagnosis not present

## 2023-11-02 DIAGNOSIS — H9190 Unspecified hearing loss, unspecified ear: Secondary | ICD-10-CM | POA: Diagnosis not present

## 2023-11-02 MED ORDER — OXYCODONE HCL 5 MG PO TABS: 5.0000 mg | ORAL_TABLET | Freq: Two times a day (BID) | ORAL | 0 refills | Status: DC | PRN

## 2023-11-02 NOTE — Telephone Encounter (Addendum)
[name] called back and stated she found someone to pickup patient's prescription. Disregard last message and leave at [facility].

## 2023-11-02 NOTE — Telephone Encounter (Signed)
[name] called back and stated that she is the caregiver for the patient and will not be able to pick up his medication that was sent to our community pharmacy. She messaged a few days ago for [facility] [address]. Please re-route prescription by today if possible.

## 2023-11-07 ENCOUNTER — Ambulatory Visit: Payer: Self-pay | Admitting: Licensed Clinical Social Worker

## 2023-11-07 NOTE — Patient Outreach (Signed)
  Care Coordination   11/07/2023 Name: [name] [name] MRN: [id number] DOB: September 04, 1946   Care Coordination Outreach Attempts:  An unsuccessful telephone outreach was attempted today to offer the patient information about available care coordination services.  Follow Up Plan:  Additional outreach attempts will be made to offer the patient care coordination information and services.   Encounter Outcome:  No Answer   Care Coordination Interventions:  Yes, provided    Gwyndolyn Saxon MSW, LCSW Licensed Clinical Social Worker  [facility], Population Health Direct Dial: [phone]  Fax: [phone]

## 2023-11-08 DIAGNOSIS — S2242XD Multiple fractures of ribs, left side, subsequent encounter for fracture with routine healing: Secondary | ICD-10-CM | POA: Diagnosis not present

## 2023-11-08 DIAGNOSIS — D631 Anemia in chronic kidney disease: Secondary | ICD-10-CM | POA: Diagnosis not present

## 2023-11-08 DIAGNOSIS — N183 Chronic kidney disease, stage 3 unspecified: Secondary | ICD-10-CM | POA: Diagnosis not present

## 2023-11-08 DIAGNOSIS — G4733 Obstructive sleep apnea (adult) (pediatric): Secondary | ICD-10-CM | POA: Diagnosis not present

## 2023-11-08 DIAGNOSIS — I129 Hypertensive chronic kidney disease with stage 1 through stage 4 chronic kidney disease, or unspecified chronic kidney disease: Secondary | ICD-10-CM | POA: Diagnosis not present

## 2023-11-08 DIAGNOSIS — K219 Gastro-esophageal reflux disease without esophagitis: Secondary | ICD-10-CM | POA: Diagnosis not present

## 2023-11-08 DIAGNOSIS — F319 Bipolar disorder, unspecified: Secondary | ICD-10-CM

## 2023-11-08 DIAGNOSIS — G40909 Epilepsy, unspecified, not intractable, without status epilepticus: Secondary | ICD-10-CM

## 2023-11-08 DIAGNOSIS — I2510 Atherosclerotic heart disease of native coronary artery without angina pectoris: Secondary | ICD-10-CM | POA: Diagnosis not present

## 2023-11-08 DIAGNOSIS — E1122 Type 2 diabetes mellitus with diabetic chronic kidney disease: Secondary | ICD-10-CM | POA: Diagnosis not present

## 2023-11-08 DIAGNOSIS — I480 Paroxysmal atrial fibrillation: Secondary | ICD-10-CM | POA: Diagnosis not present

## 2023-11-08 DIAGNOSIS — E039 Hypothyroidism, unspecified: Secondary | ICD-10-CM | POA: Diagnosis not present

## 2023-11-09 DIAGNOSIS — E1122 Type 2 diabetes mellitus with diabetic chronic kidney disease: Secondary | ICD-10-CM | POA: Diagnosis not present

## 2023-11-09 DIAGNOSIS — D509 Iron deficiency anemia, unspecified: Secondary | ICD-10-CM | POA: Diagnosis not present

## 2023-11-09 DIAGNOSIS — G2581 Restless legs syndrome: Secondary | ICD-10-CM | POA: Diagnosis not present

## 2023-11-09 DIAGNOSIS — Z79899 Other long term (current) drug therapy: Secondary | ICD-10-CM | POA: Diagnosis not present

## 2023-11-09 DIAGNOSIS — K219 Gastro-esophageal reflux disease without esophagitis: Secondary | ICD-10-CM | POA: Diagnosis not present

## 2023-11-09 DIAGNOSIS — Z9181 History of falling: Secondary | ICD-10-CM | POA: Diagnosis not present

## 2023-11-09 DIAGNOSIS — E559 Vitamin D deficiency, unspecified: Secondary | ICD-10-CM | POA: Diagnosis not present

## 2023-11-09 DIAGNOSIS — I129 Hypertensive chronic kidney disease with stage 1 through stage 4 chronic kidney disease, or unspecified chronic kidney disease: Secondary | ICD-10-CM | POA: Diagnosis not present

## 2023-11-09 DIAGNOSIS — Z7982 Long term (current) use of aspirin: Secondary | ICD-10-CM | POA: Diagnosis not present

## 2023-11-09 DIAGNOSIS — Z794 Long term (current) use of insulin: Secondary | ICD-10-CM | POA: Diagnosis not present

## 2023-11-09 DIAGNOSIS — I2510 Atherosclerotic heart disease of native coronary artery without angina pectoris: Secondary | ICD-10-CM | POA: Diagnosis not present

## 2023-11-09 DIAGNOSIS — N183 Chronic kidney disease, stage 3 unspecified: Secondary | ICD-10-CM | POA: Diagnosis not present

## 2023-11-09 DIAGNOSIS — Z7984 Long term (current) use of oral hypoglycemic drugs: Secondary | ICD-10-CM | POA: Diagnosis not present

## 2023-11-09 DIAGNOSIS — G4733 Obstructive sleep apnea (adult) (pediatric): Secondary | ICD-10-CM | POA: Diagnosis not present

## 2023-11-09 DIAGNOSIS — H9190 Unspecified hearing loss, unspecified ear: Secondary | ICD-10-CM | POA: Diagnosis not present

## 2023-11-09 DIAGNOSIS — Z7901 Long term (current) use of anticoagulants: Secondary | ICD-10-CM | POA: Diagnosis not present

## 2023-11-09 DIAGNOSIS — I480 Paroxysmal atrial fibrillation: Secondary | ICD-10-CM | POA: Diagnosis not present

## 2023-11-09 DIAGNOSIS — S2242XD Multiple fractures of ribs, left side, subsequent encounter for fracture with routine healing: Secondary | ICD-10-CM | POA: Diagnosis not present

## 2023-11-09 DIAGNOSIS — Z951 Presence of aortocoronary bypass graft: Secondary | ICD-10-CM | POA: Diagnosis not present

## 2023-11-09 DIAGNOSIS — E039 Hypothyroidism, unspecified: Secondary | ICD-10-CM | POA: Diagnosis not present

## 2023-11-09 DIAGNOSIS — D631 Anemia in chronic kidney disease: Secondary | ICD-10-CM | POA: Diagnosis not present

## 2023-11-10 ENCOUNTER — Ambulatory Visit: Payer: Self-pay | Admitting: Licensed Clinical Social Worker

## 2023-11-10 ENCOUNTER — Other Ambulatory Visit (HOSPITAL_BASED_OUTPATIENT_CLINIC_OR_DEPARTMENT_OTHER): Payer: Self-pay

## 2023-11-10 ENCOUNTER — Other Ambulatory Visit: Payer: Self-pay

## 2023-11-10 ENCOUNTER — Other Ambulatory Visit: Payer: Self-pay | Admitting: Family Medicine

## 2023-11-10 MED ORDER — NITROGLYCERIN 0.4 MG SL SUBL
SUBLINGUAL_TABLET | SUBLINGUAL | 1 refills | Status: DC
  Filled 2023-11-10: qty 25, 25d supply, fill #0
  Filled 2023-12-06: qty 25, 25d supply, fill #1

## 2023-11-11 DIAGNOSIS — Z79899 Other long term (current) drug therapy: Secondary | ICD-10-CM | POA: Diagnosis not present

## 2023-11-11 DIAGNOSIS — K219 Gastro-esophageal reflux disease without esophagitis: Secondary | ICD-10-CM | POA: Diagnosis not present

## 2023-11-11 DIAGNOSIS — E039 Hypothyroidism, unspecified: Secondary | ICD-10-CM | POA: Diagnosis not present

## 2023-11-11 DIAGNOSIS — E559 Vitamin D deficiency, unspecified: Secondary | ICD-10-CM | POA: Diagnosis not present

## 2023-11-11 DIAGNOSIS — E1122 Type 2 diabetes mellitus with diabetic chronic kidney disease: Secondary | ICD-10-CM | POA: Diagnosis not present

## 2023-11-11 DIAGNOSIS — G2581 Restless legs syndrome: Secondary | ICD-10-CM | POA: Diagnosis not present

## 2023-11-11 DIAGNOSIS — D509 Iron deficiency anemia, unspecified: Secondary | ICD-10-CM | POA: Diagnosis not present

## 2023-11-11 DIAGNOSIS — Z7984 Long term (current) use of oral hypoglycemic drugs: Secondary | ICD-10-CM | POA: Diagnosis not present

## 2023-11-11 DIAGNOSIS — S2242XD Multiple fractures of ribs, left side, subsequent encounter for fracture with routine healing: Secondary | ICD-10-CM | POA: Diagnosis not present

## 2023-11-11 DIAGNOSIS — Z951 Presence of aortocoronary bypass graft: Secondary | ICD-10-CM | POA: Diagnosis not present

## 2023-11-11 DIAGNOSIS — I480 Paroxysmal atrial fibrillation: Secondary | ICD-10-CM | POA: Diagnosis not present

## 2023-11-11 DIAGNOSIS — H9190 Unspecified hearing loss, unspecified ear: Secondary | ICD-10-CM | POA: Diagnosis not present

## 2023-11-11 DIAGNOSIS — Z7901 Long term (current) use of anticoagulants: Secondary | ICD-10-CM | POA: Diagnosis not present

## 2023-11-11 DIAGNOSIS — I129 Hypertensive chronic kidney disease with stage 1 through stage 4 chronic kidney disease, or unspecified chronic kidney disease: Secondary | ICD-10-CM | POA: Diagnosis not present

## 2023-11-11 DIAGNOSIS — D631 Anemia in chronic kidney disease: Secondary | ICD-10-CM | POA: Diagnosis not present

## 2023-11-11 DIAGNOSIS — G4733 Obstructive sleep apnea (adult) (pediatric): Secondary | ICD-10-CM | POA: Diagnosis not present

## 2023-11-11 DIAGNOSIS — Z9181 History of falling: Secondary | ICD-10-CM | POA: Diagnosis not present

## 2023-11-11 DIAGNOSIS — N183 Chronic kidney disease, stage 3 unspecified: Secondary | ICD-10-CM | POA: Diagnosis not present

## 2023-11-11 DIAGNOSIS — Z7982 Long term (current) use of aspirin: Secondary | ICD-10-CM | POA: Diagnosis not present

## 2023-11-11 DIAGNOSIS — Z794 Long term (current) use of insulin: Secondary | ICD-10-CM | POA: Diagnosis not present

## 2023-11-11 DIAGNOSIS — I2510 Atherosclerotic heart disease of native coronary artery without angina pectoris: Secondary | ICD-10-CM | POA: Diagnosis not present

## 2023-11-11 NOTE — Patient Outreach (Signed)
  Care Coordination   Follow Up Visit Note   11/11/2023 Name: [name] [name] MRN: [id number] DOB: 1946/08/08  [name] [name] is a 77 y.o. year old male who sees Bradd Canary, MD for primary care. I spoke with  Arcelia Jew daughter [name] [name] by phone today.  What matters to the patients health and wellness today?  Completed follow up with pt daughter [name] [name]. Discussed future plans of skilled nursing facility and pca services.     Goals Addressed             This Visit's Progress    Care Cooridnation       Activities and task to complete in order to accomplish goals.   Call your insurance provider for more information about your Enhanced Benefits (for pca servicesl) Review private pay home care options provided and discussed Complete Advance Directive packet,  Have advance directive notarized and provide a copy to provider office   Review list of nursing facilities         SDOH assessments and interventions completed:  Yes     Care Coordination Interventions:  Yes, provided     Follow up plan: Follow up call scheduled for 11/25/2023    Encounter Outcome:  Patient Visit Completed   Gwyndolyn Saxon MSW, LCSW Licensed Clinical Social Worker  [facility], Population Health Direct Dial: [phone]  Fax: [phone]

## 2023-11-15 DIAGNOSIS — I2510 Atherosclerotic heart disease of native coronary artery without angina pectoris: Secondary | ICD-10-CM | POA: Diagnosis not present

## 2023-11-15 DIAGNOSIS — E039 Hypothyroidism, unspecified: Secondary | ICD-10-CM | POA: Diagnosis not present

## 2023-11-15 DIAGNOSIS — Z7901 Long term (current) use of anticoagulants: Secondary | ICD-10-CM | POA: Diagnosis not present

## 2023-11-15 DIAGNOSIS — Z7984 Long term (current) use of oral hypoglycemic drugs: Secondary | ICD-10-CM | POA: Diagnosis not present

## 2023-11-15 DIAGNOSIS — S2242XD Multiple fractures of ribs, left side, subsequent encounter for fracture with routine healing: Secondary | ICD-10-CM | POA: Diagnosis not present

## 2023-11-15 DIAGNOSIS — D631 Anemia in chronic kidney disease: Secondary | ICD-10-CM | POA: Diagnosis not present

## 2023-11-15 DIAGNOSIS — Z7982 Long term (current) use of aspirin: Secondary | ICD-10-CM | POA: Diagnosis not present

## 2023-11-15 DIAGNOSIS — I480 Paroxysmal atrial fibrillation: Secondary | ICD-10-CM | POA: Diagnosis not present

## 2023-11-15 DIAGNOSIS — Z9181 History of falling: Secondary | ICD-10-CM | POA: Diagnosis not present

## 2023-11-15 DIAGNOSIS — I129 Hypertensive chronic kidney disease with stage 1 through stage 4 chronic kidney disease, or unspecified chronic kidney disease: Secondary | ICD-10-CM | POA: Diagnosis not present

## 2023-11-15 DIAGNOSIS — G2581 Restless legs syndrome: Secondary | ICD-10-CM | POA: Diagnosis not present

## 2023-11-15 DIAGNOSIS — N183 Chronic kidney disease, stage 3 unspecified: Secondary | ICD-10-CM | POA: Diagnosis not present

## 2023-11-15 DIAGNOSIS — D509 Iron deficiency anemia, unspecified: Secondary | ICD-10-CM | POA: Diagnosis not present

## 2023-11-15 DIAGNOSIS — Z794 Long term (current) use of insulin: Secondary | ICD-10-CM | POA: Diagnosis not present

## 2023-11-15 DIAGNOSIS — E559 Vitamin D deficiency, unspecified: Secondary | ICD-10-CM | POA: Diagnosis not present

## 2023-11-15 DIAGNOSIS — K219 Gastro-esophageal reflux disease without esophagitis: Secondary | ICD-10-CM | POA: Diagnosis not present

## 2023-11-15 DIAGNOSIS — E1122 Type 2 diabetes mellitus with diabetic chronic kidney disease: Secondary | ICD-10-CM | POA: Diagnosis not present

## 2023-11-15 DIAGNOSIS — G4733 Obstructive sleep apnea (adult) (pediatric): Secondary | ICD-10-CM | POA: Diagnosis not present

## 2023-11-15 DIAGNOSIS — H9190 Unspecified hearing loss, unspecified ear: Secondary | ICD-10-CM | POA: Diagnosis not present

## 2023-11-15 DIAGNOSIS — Z79899 Other long term (current) drug therapy: Secondary | ICD-10-CM | POA: Diagnosis not present

## 2023-11-15 DIAGNOSIS — Z951 Presence of aortocoronary bypass graft: Secondary | ICD-10-CM | POA: Diagnosis not present

## 2023-11-17 DIAGNOSIS — E039 Hypothyroidism, unspecified: Secondary | ICD-10-CM | POA: Diagnosis not present

## 2023-11-17 DIAGNOSIS — G4733 Obstructive sleep apnea (adult) (pediatric): Secondary | ICD-10-CM | POA: Diagnosis not present

## 2023-11-17 DIAGNOSIS — Z7984 Long term (current) use of oral hypoglycemic drugs: Secondary | ICD-10-CM | POA: Diagnosis not present

## 2023-11-17 DIAGNOSIS — I480 Paroxysmal atrial fibrillation: Secondary | ICD-10-CM | POA: Diagnosis not present

## 2023-11-17 DIAGNOSIS — E1122 Type 2 diabetes mellitus with diabetic chronic kidney disease: Secondary | ICD-10-CM | POA: Diagnosis not present

## 2023-11-17 DIAGNOSIS — D631 Anemia in chronic kidney disease: Secondary | ICD-10-CM | POA: Diagnosis not present

## 2023-11-17 DIAGNOSIS — Z7982 Long term (current) use of aspirin: Secondary | ICD-10-CM | POA: Diagnosis not present

## 2023-11-17 DIAGNOSIS — Z79899 Other long term (current) drug therapy: Secondary | ICD-10-CM | POA: Diagnosis not present

## 2023-11-17 DIAGNOSIS — S2242XD Multiple fractures of ribs, left side, subsequent encounter for fracture with routine healing: Secondary | ICD-10-CM | POA: Diagnosis not present

## 2023-11-17 DIAGNOSIS — H9190 Unspecified hearing loss, unspecified ear: Secondary | ICD-10-CM | POA: Diagnosis not present

## 2023-11-17 DIAGNOSIS — D509 Iron deficiency anemia, unspecified: Secondary | ICD-10-CM | POA: Diagnosis not present

## 2023-11-17 DIAGNOSIS — Z9181 History of falling: Secondary | ICD-10-CM | POA: Diagnosis not present

## 2023-11-17 DIAGNOSIS — K219 Gastro-esophageal reflux disease without esophagitis: Secondary | ICD-10-CM | POA: Diagnosis not present

## 2023-11-17 DIAGNOSIS — Z794 Long term (current) use of insulin: Secondary | ICD-10-CM | POA: Diagnosis not present

## 2023-11-17 DIAGNOSIS — G2581 Restless legs syndrome: Secondary | ICD-10-CM | POA: Diagnosis not present

## 2023-11-17 DIAGNOSIS — I2510 Atherosclerotic heart disease of native coronary artery without angina pectoris: Secondary | ICD-10-CM | POA: Diagnosis not present

## 2023-11-17 DIAGNOSIS — Z7901 Long term (current) use of anticoagulants: Secondary | ICD-10-CM | POA: Diagnosis not present

## 2023-11-17 DIAGNOSIS — E559 Vitamin D deficiency, unspecified: Secondary | ICD-10-CM | POA: Diagnosis not present

## 2023-11-17 DIAGNOSIS — I129 Hypertensive chronic kidney disease with stage 1 through stage 4 chronic kidney disease, or unspecified chronic kidney disease: Secondary | ICD-10-CM | POA: Diagnosis not present

## 2023-11-17 DIAGNOSIS — Z951 Presence of aortocoronary bypass graft: Secondary | ICD-10-CM | POA: Diagnosis not present

## 2023-11-17 DIAGNOSIS — N183 Chronic kidney disease, stage 3 unspecified: Secondary | ICD-10-CM | POA: Diagnosis not present

## 2023-11-21 DIAGNOSIS — K219 Gastro-esophageal reflux disease without esophagitis: Secondary | ICD-10-CM | POA: Diagnosis not present

## 2023-11-21 DIAGNOSIS — H9190 Unspecified hearing loss, unspecified ear: Secondary | ICD-10-CM | POA: Diagnosis not present

## 2023-11-21 DIAGNOSIS — Z79899 Other long term (current) drug therapy: Secondary | ICD-10-CM | POA: Diagnosis not present

## 2023-11-21 DIAGNOSIS — Z9181 History of falling: Secondary | ICD-10-CM | POA: Diagnosis not present

## 2023-11-21 DIAGNOSIS — N183 Chronic kidney disease, stage 3 unspecified: Secondary | ICD-10-CM | POA: Diagnosis not present

## 2023-11-21 DIAGNOSIS — Z7982 Long term (current) use of aspirin: Secondary | ICD-10-CM | POA: Diagnosis not present

## 2023-11-21 DIAGNOSIS — S2242XD Multiple fractures of ribs, left side, subsequent encounter for fracture with routine healing: Secondary | ICD-10-CM | POA: Diagnosis not present

## 2023-11-21 DIAGNOSIS — Z7984 Long term (current) use of oral hypoglycemic drugs: Secondary | ICD-10-CM | POA: Diagnosis not present

## 2023-11-21 DIAGNOSIS — I480 Paroxysmal atrial fibrillation: Secondary | ICD-10-CM | POA: Diagnosis not present

## 2023-11-21 DIAGNOSIS — D509 Iron deficiency anemia, unspecified: Secondary | ICD-10-CM | POA: Diagnosis not present

## 2023-11-21 DIAGNOSIS — Z794 Long term (current) use of insulin: Secondary | ICD-10-CM | POA: Diagnosis not present

## 2023-11-21 DIAGNOSIS — E1122 Type 2 diabetes mellitus with diabetic chronic kidney disease: Secondary | ICD-10-CM | POA: Diagnosis not present

## 2023-11-21 DIAGNOSIS — D631 Anemia in chronic kidney disease: Secondary | ICD-10-CM | POA: Diagnosis not present

## 2023-11-21 DIAGNOSIS — G2581 Restless legs syndrome: Secondary | ICD-10-CM | POA: Diagnosis not present

## 2023-11-21 DIAGNOSIS — I129 Hypertensive chronic kidney disease with stage 1 through stage 4 chronic kidney disease, or unspecified chronic kidney disease: Secondary | ICD-10-CM | POA: Diagnosis not present

## 2023-11-21 DIAGNOSIS — Z951 Presence of aortocoronary bypass graft: Secondary | ICD-10-CM | POA: Diagnosis not present

## 2023-11-21 DIAGNOSIS — E559 Vitamin D deficiency, unspecified: Secondary | ICD-10-CM | POA: Diagnosis not present

## 2023-11-21 DIAGNOSIS — Z7901 Long term (current) use of anticoagulants: Secondary | ICD-10-CM | POA: Diagnosis not present

## 2023-11-21 DIAGNOSIS — G4733 Obstructive sleep apnea (adult) (pediatric): Secondary | ICD-10-CM | POA: Diagnosis not present

## 2023-11-21 DIAGNOSIS — E039 Hypothyroidism, unspecified: Secondary | ICD-10-CM | POA: Diagnosis not present

## 2023-11-21 DIAGNOSIS — I2510 Atherosclerotic heart disease of native coronary artery without angina pectoris: Secondary | ICD-10-CM | POA: Diagnosis not present

## 2023-11-22 ENCOUNTER — Telehealth: Payer: Self-pay | Admitting: Podiatry

## 2023-11-22 NOTE — Telephone Encounter (Signed)
I put the correct order into [employer]est[employer] and have faxed it to Dr [facility] office

## 2023-11-22 NOTE — Telephone Encounter (Signed)
Pts daughter called upset that she has not heard anything about the pts shoes and he was measured in October. She said her dad asks her daily. I checked and the paperwork was sent to Dr Everardo All who has retired. He see's Dr Lonzo Cloud. Could we get the paperwork sent to them asap. So pt can get his shoes asap.Marland Kitchen

## 2023-11-24 DIAGNOSIS — E1122 Type 2 diabetes mellitus with diabetic chronic kidney disease: Secondary | ICD-10-CM | POA: Diagnosis not present

## 2023-11-24 DIAGNOSIS — Z79899 Other long term (current) drug therapy: Secondary | ICD-10-CM | POA: Diagnosis not present

## 2023-11-24 DIAGNOSIS — E039 Hypothyroidism, unspecified: Secondary | ICD-10-CM | POA: Diagnosis not present

## 2023-11-24 DIAGNOSIS — Z794 Long term (current) use of insulin: Secondary | ICD-10-CM | POA: Diagnosis not present

## 2023-11-24 DIAGNOSIS — I480 Paroxysmal atrial fibrillation: Secondary | ICD-10-CM | POA: Diagnosis not present

## 2023-11-24 DIAGNOSIS — I129 Hypertensive chronic kidney disease with stage 1 through stage 4 chronic kidney disease, or unspecified chronic kidney disease: Secondary | ICD-10-CM | POA: Diagnosis not present

## 2023-11-24 DIAGNOSIS — G2581 Restless legs syndrome: Secondary | ICD-10-CM | POA: Diagnosis not present

## 2023-11-24 DIAGNOSIS — Z7982 Long term (current) use of aspirin: Secondary | ICD-10-CM | POA: Diagnosis not present

## 2023-11-24 DIAGNOSIS — S2242XD Multiple fractures of ribs, left side, subsequent encounter for fracture with routine healing: Secondary | ICD-10-CM | POA: Diagnosis not present

## 2023-11-24 DIAGNOSIS — D509 Iron deficiency anemia, unspecified: Secondary | ICD-10-CM | POA: Diagnosis not present

## 2023-11-24 DIAGNOSIS — Z951 Presence of aortocoronary bypass graft: Secondary | ICD-10-CM | POA: Diagnosis not present

## 2023-11-24 DIAGNOSIS — I2510 Atherosclerotic heart disease of native coronary artery without angina pectoris: Secondary | ICD-10-CM | POA: Diagnosis not present

## 2023-11-24 DIAGNOSIS — G4733 Obstructive sleep apnea (adult) (pediatric): Secondary | ICD-10-CM | POA: Diagnosis not present

## 2023-11-24 DIAGNOSIS — E559 Vitamin D deficiency, unspecified: Secondary | ICD-10-CM | POA: Diagnosis not present

## 2023-11-24 DIAGNOSIS — K219 Gastro-esophageal reflux disease without esophagitis: Secondary | ICD-10-CM | POA: Diagnosis not present

## 2023-11-24 DIAGNOSIS — N183 Chronic kidney disease, stage 3 unspecified: Secondary | ICD-10-CM | POA: Diagnosis not present

## 2023-11-24 DIAGNOSIS — D631 Anemia in chronic kidney disease: Secondary | ICD-10-CM | POA: Diagnosis not present

## 2023-11-24 DIAGNOSIS — Z7901 Long term (current) use of anticoagulants: Secondary | ICD-10-CM | POA: Diagnosis not present

## 2023-11-24 DIAGNOSIS — Z9181 History of falling: Secondary | ICD-10-CM | POA: Diagnosis not present

## 2023-11-24 DIAGNOSIS — H9190 Unspecified hearing loss, unspecified ear: Secondary | ICD-10-CM | POA: Diagnosis not present

## 2023-11-24 DIAGNOSIS — Z7984 Long term (current) use of oral hypoglycemic drugs: Secondary | ICD-10-CM | POA: Diagnosis not present

## 2023-11-30 ENCOUNTER — Other Ambulatory Visit: Payer: Self-pay | Admitting: Family Medicine

## 2023-11-30 ENCOUNTER — Other Ambulatory Visit: Payer: Self-pay | Admitting: Interventional Cardiology

## 2023-11-30 DIAGNOSIS — G2581 Restless legs syndrome: Secondary | ICD-10-CM

## 2023-12-01 ENCOUNTER — Other Ambulatory Visit: Payer: Self-pay

## 2023-12-01 ENCOUNTER — Other Ambulatory Visit (HOSPITAL_BASED_OUTPATIENT_CLINIC_OR_DEPARTMENT_OTHER): Payer: Self-pay

## 2023-12-01 MED ORDER — ATORVASTATIN CALCIUM 80 MG PO TABS
80.0000 mg | ORAL_TABLET | Freq: Every day | ORAL | 0 refills | Status: DC
  Filled 2023-12-01: qty 90, 90d supply, fill #0

## 2023-12-01 MED ORDER — AMIODARONE HCL 200 MG PO TABS
200.0000 mg | ORAL_TABLET | Freq: Every day | ORAL | 1 refills | Status: DC
  Filled 2023-12-01: qty 90, 90d supply, fill #0
  Filled 2023-12-28 – 2024-01-18 (×3): qty 90, 90d supply, fill #1
  Filled 2024-02-27: qty 30, 30d supply, fill #1
  Filled 2024-03-21 – 2024-03-22 (×2): qty 30, 30d supply, fill #2
  Filled 2024-04-10 – 2024-04-20 (×3): qty 30, 30d supply, fill #3
  Filled ????-??-?? (×2): fill #1

## 2023-12-01 MED ORDER — PRAMIPEXOLE DIHYDROCHLORIDE 1 MG PO TABS
0.5000 mg | ORAL_TABLET | Freq: Every day | ORAL | 0 refills | Status: DC
Start: 2023-12-01 — End: 2023-12-21
  Filled 2023-12-01: qty 90, 90d supply, fill #0

## 2023-12-02 ENCOUNTER — Encounter: Payer: Self-pay | Admitting: Family Medicine

## 2023-12-02 DIAGNOSIS — D631 Anemia in chronic kidney disease: Secondary | ICD-10-CM | POA: Diagnosis not present

## 2023-12-02 DIAGNOSIS — G4733 Obstructive sleep apnea (adult) (pediatric): Secondary | ICD-10-CM | POA: Diagnosis not present

## 2023-12-02 DIAGNOSIS — Z7982 Long term (current) use of aspirin: Secondary | ICD-10-CM | POA: Diagnosis not present

## 2023-12-02 DIAGNOSIS — Z79899 Other long term (current) drug therapy: Secondary | ICD-10-CM | POA: Diagnosis not present

## 2023-12-02 DIAGNOSIS — D509 Iron deficiency anemia, unspecified: Secondary | ICD-10-CM | POA: Diagnosis not present

## 2023-12-02 DIAGNOSIS — I2510 Atherosclerotic heart disease of native coronary artery without angina pectoris: Secondary | ICD-10-CM | POA: Diagnosis not present

## 2023-12-02 DIAGNOSIS — Z794 Long term (current) use of insulin: Secondary | ICD-10-CM | POA: Diagnosis not present

## 2023-12-02 DIAGNOSIS — G2581 Restless legs syndrome: Secondary | ICD-10-CM | POA: Diagnosis not present

## 2023-12-02 DIAGNOSIS — S2242XD Multiple fractures of ribs, left side, subsequent encounter for fracture with routine healing: Secondary | ICD-10-CM | POA: Diagnosis not present

## 2023-12-02 DIAGNOSIS — Z951 Presence of aortocoronary bypass graft: Secondary | ICD-10-CM | POA: Diagnosis not present

## 2023-12-02 DIAGNOSIS — H9190 Unspecified hearing loss, unspecified ear: Secondary | ICD-10-CM | POA: Diagnosis not present

## 2023-12-02 DIAGNOSIS — E039 Hypothyroidism, unspecified: Secondary | ICD-10-CM | POA: Diagnosis not present

## 2023-12-02 DIAGNOSIS — I480 Paroxysmal atrial fibrillation: Secondary | ICD-10-CM | POA: Diagnosis not present

## 2023-12-02 DIAGNOSIS — E559 Vitamin D deficiency, unspecified: Secondary | ICD-10-CM | POA: Diagnosis not present

## 2023-12-02 DIAGNOSIS — K219 Gastro-esophageal reflux disease without esophagitis: Secondary | ICD-10-CM | POA: Diagnosis not present

## 2023-12-02 DIAGNOSIS — Z7901 Long term (current) use of anticoagulants: Secondary | ICD-10-CM | POA: Diagnosis not present

## 2023-12-02 DIAGNOSIS — N183 Chronic kidney disease, stage 3 unspecified: Secondary | ICD-10-CM | POA: Diagnosis not present

## 2023-12-02 DIAGNOSIS — Z9181 History of falling: Secondary | ICD-10-CM | POA: Diagnosis not present

## 2023-12-02 DIAGNOSIS — E1122 Type 2 diabetes mellitus with diabetic chronic kidney disease: Secondary | ICD-10-CM | POA: Diagnosis not present

## 2023-12-02 DIAGNOSIS — I129 Hypertensive chronic kidney disease with stage 1 through stage 4 chronic kidney disease, or unspecified chronic kidney disease: Secondary | ICD-10-CM | POA: Diagnosis not present

## 2023-12-02 DIAGNOSIS — Z7984 Long term (current) use of oral hypoglycemic drugs: Secondary | ICD-10-CM | POA: Diagnosis not present

## 2023-12-02 NOTE — Telephone Encounter (Signed)
Spoke with Patient's daughter. She said the Freestyle reader range is between 60 to 160. The lowest reading is 53. His last meal is usually around 8:30 at night and he is bottoming out around 3 am. The highest reading was 353 on the 22nd  and that usually occurs up in the day when the patient is eating. The average reading for the week is 165

## 2023-12-02 NOTE — Telephone Encounter (Signed)
Just spoke with daughter [name] and she said she's not sure if her father has been taking his insulin correctly. She is going to get clarification from Tammy before we start switching doses around. She said they have a chart according to what his blood glucose is, as to how much insulin they should take.

## 2023-12-06 ENCOUNTER — Other Ambulatory Visit: Payer: Self-pay

## 2023-12-06 ENCOUNTER — Other Ambulatory Visit: Payer: Self-pay | Admitting: Family Medicine

## 2023-12-06 ENCOUNTER — Other Ambulatory Visit (HOSPITAL_COMMUNITY): Payer: Self-pay

## 2023-12-06 ENCOUNTER — Other Ambulatory Visit (HOSPITAL_BASED_OUTPATIENT_CLINIC_OR_DEPARTMENT_OTHER): Payer: Self-pay

## 2023-12-06 ENCOUNTER — Telehealth: Payer: Self-pay | Admitting: Pharmacist

## 2023-12-06 MED ORDER — HYDROXYZINE HCL 10 MG PO TABS
10.0000 mg | ORAL_TABLET | Freq: Three times a day (TID) | ORAL | 0 refills | Status: DC | PRN
  Filled 2023-12-06: qty 270, 90d supply, fill #0

## 2023-12-06 MED ORDER — SERTRALINE HCL 100 MG PO TABS
100.0000 mg | ORAL_TABLET | Freq: Every day | ORAL | 0 refills | Status: DC
  Filled 2023-12-06: qty 30, 30d supply, fill #0
  Filled 2023-12-06: qty 90, 90d supply, fill #0
  Filled 2023-12-28 – 2024-01-05 (×4): qty 30, 30d supply, fill #1
  Filled 2024-01-18 – 2024-01-31 (×2): qty 30, 30d supply, fill #2

## 2023-12-06 NOTE — Progress Notes (Signed)
 Covering for [employer]. Contacted patient's daughter, Jon, to discuss hypoglycemia.   Discussed with Jon. He has been taking Toujeo  in the morning, but has now shifted to taking in the afternoon. She denies any continued issues with overnight hypoglycemia. I do not have access to [facility], so we discussed adding to our general [facility] so I could see readings. Jon needed to check on her father's [employer] password/username, as it was making her log back in before she could add another clinic. She notes she will call me back once she has done this.

## 2023-12-09 ENCOUNTER — Other Ambulatory Visit: Payer: Self-pay

## 2023-12-09 DIAGNOSIS — K219 Gastro-esophageal reflux disease without esophagitis: Secondary | ICD-10-CM | POA: Diagnosis not present

## 2023-12-09 DIAGNOSIS — Z7984 Long term (current) use of oral hypoglycemic drugs: Secondary | ICD-10-CM | POA: Diagnosis not present

## 2023-12-09 DIAGNOSIS — Z79899 Other long term (current) drug therapy: Secondary | ICD-10-CM | POA: Diagnosis not present

## 2023-12-09 DIAGNOSIS — E559 Vitamin D deficiency, unspecified: Secondary | ICD-10-CM | POA: Diagnosis not present

## 2023-12-09 DIAGNOSIS — Z7901 Long term (current) use of anticoagulants: Secondary | ICD-10-CM | POA: Diagnosis not present

## 2023-12-09 DIAGNOSIS — E039 Hypothyroidism, unspecified: Secondary | ICD-10-CM | POA: Diagnosis not present

## 2023-12-09 DIAGNOSIS — I129 Hypertensive chronic kidney disease with stage 1 through stage 4 chronic kidney disease, or unspecified chronic kidney disease: Secondary | ICD-10-CM | POA: Diagnosis not present

## 2023-12-09 DIAGNOSIS — N183 Chronic kidney disease, stage 3 unspecified: Secondary | ICD-10-CM | POA: Diagnosis not present

## 2023-12-09 DIAGNOSIS — Z794 Long term (current) use of insulin: Secondary | ICD-10-CM | POA: Diagnosis not present

## 2023-12-09 DIAGNOSIS — Z951 Presence of aortocoronary bypass graft: Secondary | ICD-10-CM | POA: Diagnosis not present

## 2023-12-09 DIAGNOSIS — S2242XD Multiple fractures of ribs, left side, subsequent encounter for fracture with routine healing: Secondary | ICD-10-CM | POA: Diagnosis not present

## 2023-12-09 DIAGNOSIS — I2510 Atherosclerotic heart disease of native coronary artery without angina pectoris: Secondary | ICD-10-CM | POA: Diagnosis not present

## 2023-12-09 DIAGNOSIS — G4733 Obstructive sleep apnea (adult) (pediatric): Secondary | ICD-10-CM | POA: Diagnosis not present

## 2023-12-09 DIAGNOSIS — D631 Anemia in chronic kidney disease: Secondary | ICD-10-CM | POA: Diagnosis not present

## 2023-12-09 DIAGNOSIS — G2581 Restless legs syndrome: Secondary | ICD-10-CM | POA: Diagnosis not present

## 2023-12-09 DIAGNOSIS — H9190 Unspecified hearing loss, unspecified ear: Secondary | ICD-10-CM | POA: Diagnosis not present

## 2023-12-09 DIAGNOSIS — D509 Iron deficiency anemia, unspecified: Secondary | ICD-10-CM | POA: Diagnosis not present

## 2023-12-09 DIAGNOSIS — Z9181 History of falling: Secondary | ICD-10-CM | POA: Diagnosis not present

## 2023-12-09 DIAGNOSIS — Z7982 Long term (current) use of aspirin: Secondary | ICD-10-CM | POA: Diagnosis not present

## 2023-12-09 DIAGNOSIS — E1122 Type 2 diabetes mellitus with diabetic chronic kidney disease: Secondary | ICD-10-CM | POA: Diagnosis not present

## 2023-12-09 DIAGNOSIS — I480 Paroxysmal atrial fibrillation: Secondary | ICD-10-CM | POA: Diagnosis not present

## 2023-12-09 NOTE — Progress Notes (Signed)
 Tried to review Continuous Glucose Monitor report but last date available in [facility] is 10/19/2023.  Tried to call patient's daughter to review and hopefully assist in downloading more recent blood glucose information. Unable to reach his daughter but LM on VM with my CB# [phone] or at [facility] office today [phone].

## 2023-12-12 ENCOUNTER — Other Ambulatory Visit: Payer: Self-pay

## 2023-12-12 DIAGNOSIS — Z7984 Long term (current) use of oral hypoglycemic drugs: Secondary | ICD-10-CM | POA: Diagnosis not present

## 2023-12-12 DIAGNOSIS — Z9181 History of falling: Secondary | ICD-10-CM | POA: Diagnosis not present

## 2023-12-12 DIAGNOSIS — Z951 Presence of aortocoronary bypass graft: Secondary | ICD-10-CM | POA: Diagnosis not present

## 2023-12-12 DIAGNOSIS — S2242XD Multiple fractures of ribs, left side, subsequent encounter for fracture with routine healing: Secondary | ICD-10-CM | POA: Diagnosis not present

## 2023-12-12 DIAGNOSIS — Z7982 Long term (current) use of aspirin: Secondary | ICD-10-CM | POA: Diagnosis not present

## 2023-12-12 DIAGNOSIS — D631 Anemia in chronic kidney disease: Secondary | ICD-10-CM | POA: Diagnosis not present

## 2023-12-12 DIAGNOSIS — E039 Hypothyroidism, unspecified: Secondary | ICD-10-CM | POA: Diagnosis not present

## 2023-12-12 DIAGNOSIS — K219 Gastro-esophageal reflux disease without esophagitis: Secondary | ICD-10-CM | POA: Diagnosis not present

## 2023-12-12 DIAGNOSIS — H9190 Unspecified hearing loss, unspecified ear: Secondary | ICD-10-CM | POA: Diagnosis not present

## 2023-12-12 DIAGNOSIS — D509 Iron deficiency anemia, unspecified: Secondary | ICD-10-CM | POA: Diagnosis not present

## 2023-12-12 DIAGNOSIS — Z7901 Long term (current) use of anticoagulants: Secondary | ICD-10-CM | POA: Diagnosis not present

## 2023-12-12 DIAGNOSIS — I129 Hypertensive chronic kidney disease with stage 1 through stage 4 chronic kidney disease, or unspecified chronic kidney disease: Secondary | ICD-10-CM | POA: Diagnosis not present

## 2023-12-12 DIAGNOSIS — Z79899 Other long term (current) drug therapy: Secondary | ICD-10-CM | POA: Diagnosis not present

## 2023-12-12 DIAGNOSIS — Z794 Long term (current) use of insulin: Secondary | ICD-10-CM | POA: Diagnosis not present

## 2023-12-12 DIAGNOSIS — I480 Paroxysmal atrial fibrillation: Secondary | ICD-10-CM | POA: Diagnosis not present

## 2023-12-12 DIAGNOSIS — E1122 Type 2 diabetes mellitus with diabetic chronic kidney disease: Secondary | ICD-10-CM | POA: Diagnosis not present

## 2023-12-12 DIAGNOSIS — N183 Chronic kidney disease, stage 3 unspecified: Secondary | ICD-10-CM | POA: Diagnosis not present

## 2023-12-12 DIAGNOSIS — E559 Vitamin D deficiency, unspecified: Secondary | ICD-10-CM | POA: Diagnosis not present

## 2023-12-12 DIAGNOSIS — I2510 Atherosclerotic heart disease of native coronary artery without angina pectoris: Secondary | ICD-10-CM | POA: Diagnosis not present

## 2023-12-12 DIAGNOSIS — G2581 Restless legs syndrome: Secondary | ICD-10-CM | POA: Diagnosis not present

## 2023-12-12 DIAGNOSIS — G4733 Obstructive sleep apnea (adult) (pediatric): Secondary | ICD-10-CM | POA: Diagnosis not present

## 2023-12-13 ENCOUNTER — Telehealth: Payer: Self-pay | Admitting: Podiatry

## 2023-12-13 ENCOUNTER — Telehealth: Payer: Self-pay | Admitting: Family Medicine

## 2023-12-13 NOTE — Telephone Encounter (Signed)
 Copied from CRM [id number]. Topic: General - Other >> Dec 13, 2023 10:54 AM Gerardine PARAS wrote: Reason for CRM: Dawn called from [facility] in regarding speaking with Dr. Trisha nurse to confirm if Dr. Domenica could sign off on paperwork. Please call back at [phone] and confirm so paperwork can be sent over, voicemail's are okay

## 2023-12-13 NOTE — Telephone Encounter (Signed)
 Pts daughter called upset about diabetic shoes. She states they were measured in October and was told 4 to 6 wks and she called back in December about it and was able to give her some information at that time. The paperwork was corrected and sent to endo after the call in December but She has not heard anything else and is frustrated about it as pt ask her daily( hx of alzheimer's). She asked to talk to administrator. I transfered but no answer. She called back.She asked if I could look up anything and I did.  Checked [employer] and we did get the paperwork but the last office visit from the endo was June 2024 and it has to be within 6 months and I do not see pt scheduled with them at all.Daughter said last appt with them they were told to come back in a yr . The daughter stated that they have been going to Dr Vickii office working with a specialist there to help manage diabetes. I told her I would call the daughter back.  I told pts daughter I would call Dr Vickii office and see if she would be willing to sign off on the shoes since that office has been dealing with pts diabetes.  I called and spoke to Kalissa(melissa) it was hard to hear her and she was sending a message to Dr Benjamine and her assistant to see if they would be willing to sign off for pt to get the shoes since that office has been working with pt closely. I was told nurse/assistant would call me back by this afternoon or tomorrow morning.  She would still like the administrator to call her back.   Pts daughter would like a call

## 2023-12-14 ENCOUNTER — Encounter: Payer: Self-pay | Admitting: Family Medicine

## 2023-12-14 ENCOUNTER — Telehealth: Payer: Self-pay

## 2023-12-14 NOTE — Telephone Encounter (Signed)
 Looks like the paperwork is about diabetic shoes and that she can fax over paperwork so we can sign.

## 2023-12-14 NOTE — Telephone Encounter (Signed)
 Notified pts daughter that I have left message yesterday for Dr Mariel Aloe nurse to call back to see if she would sign off on paperwork

## 2023-12-14 NOTE — Telephone Encounter (Signed)
 Got a message from Dr Elisabeth nurse [place] that ok to fax paperwork for shoes to them today at 327pm. Fax number [phone].  I have faxed it over and spoke to [place] at Dr [facility] office and they did not see it so I refaxed it and they are looking out for it to give to the nurse.   I left message for pts daughter that I have faxed it to Dr Domenica as her assistant said. I will keep eye on it to make sure we get it back.

## 2023-12-14 NOTE — Telephone Encounter (Signed)
 Copied from CRM [id number]. Topic: General - Other >> Dec 14, 2023  3:53 PM Franky GRADE wrote: Reason for CRM: Dawn from [facility] is calling to follow up if a fax was received that she sent a few moments ago that [name] [name] was pending.  Call back number is [phone].

## 2023-12-15 ENCOUNTER — Other Ambulatory Visit (HOSPITAL_BASED_OUTPATIENT_CLINIC_OR_DEPARTMENT_OTHER): Payer: Self-pay

## 2023-12-19 NOTE — Telephone Encounter (Signed)
 Left message on machine that fax was received.

## 2023-12-21 ENCOUNTER — Other Ambulatory Visit: Payer: Self-pay | Admitting: Family Medicine

## 2023-12-21 ENCOUNTER — Other Ambulatory Visit: Payer: Self-pay

## 2023-12-21 DIAGNOSIS — F419 Anxiety disorder, unspecified: Secondary | ICD-10-CM

## 2023-12-21 DIAGNOSIS — G2581 Restless legs syndrome: Secondary | ICD-10-CM

## 2023-12-21 DIAGNOSIS — G4700 Insomnia, unspecified: Secondary | ICD-10-CM

## 2023-12-22 ENCOUNTER — Other Ambulatory Visit (HOSPITAL_COMMUNITY): Payer: Self-pay

## 2023-12-22 DIAGNOSIS — N183 Chronic kidney disease, stage 3 unspecified: Secondary | ICD-10-CM | POA: Diagnosis not present

## 2023-12-22 DIAGNOSIS — E1122 Type 2 diabetes mellitus with diabetic chronic kidney disease: Secondary | ICD-10-CM | POA: Diagnosis not present

## 2023-12-22 DIAGNOSIS — G2581 Restless legs syndrome: Secondary | ICD-10-CM | POA: Diagnosis not present

## 2023-12-22 DIAGNOSIS — I480 Paroxysmal atrial fibrillation: Secondary | ICD-10-CM | POA: Diagnosis not present

## 2023-12-22 DIAGNOSIS — I2510 Atherosclerotic heart disease of native coronary artery without angina pectoris: Secondary | ICD-10-CM | POA: Diagnosis not present

## 2023-12-22 DIAGNOSIS — Z79899 Other long term (current) drug therapy: Secondary | ICD-10-CM | POA: Diagnosis not present

## 2023-12-22 DIAGNOSIS — Z794 Long term (current) use of insulin: Secondary | ICD-10-CM | POA: Diagnosis not present

## 2023-12-22 DIAGNOSIS — Z7984 Long term (current) use of oral hypoglycemic drugs: Secondary | ICD-10-CM | POA: Diagnosis not present

## 2023-12-22 DIAGNOSIS — E559 Vitamin D deficiency, unspecified: Secondary | ICD-10-CM | POA: Diagnosis not present

## 2023-12-22 DIAGNOSIS — Z9181 History of falling: Secondary | ICD-10-CM | POA: Diagnosis not present

## 2023-12-22 DIAGNOSIS — K219 Gastro-esophageal reflux disease without esophagitis: Secondary | ICD-10-CM | POA: Diagnosis not present

## 2023-12-22 DIAGNOSIS — S2242XD Multiple fractures of ribs, left side, subsequent encounter for fracture with routine healing: Secondary | ICD-10-CM | POA: Diagnosis not present

## 2023-12-22 DIAGNOSIS — Z7901 Long term (current) use of anticoagulants: Secondary | ICD-10-CM | POA: Diagnosis not present

## 2023-12-22 DIAGNOSIS — G4733 Obstructive sleep apnea (adult) (pediatric): Secondary | ICD-10-CM | POA: Diagnosis not present

## 2023-12-22 DIAGNOSIS — I129 Hypertensive chronic kidney disease with stage 1 through stage 4 chronic kidney disease, or unspecified chronic kidney disease: Secondary | ICD-10-CM | POA: Diagnosis not present

## 2023-12-22 DIAGNOSIS — Z951 Presence of aortocoronary bypass graft: Secondary | ICD-10-CM | POA: Diagnosis not present

## 2023-12-22 DIAGNOSIS — D509 Iron deficiency anemia, unspecified: Secondary | ICD-10-CM | POA: Diagnosis not present

## 2023-12-22 DIAGNOSIS — H9190 Unspecified hearing loss, unspecified ear: Secondary | ICD-10-CM | POA: Diagnosis not present

## 2023-12-22 DIAGNOSIS — Z7982 Long term (current) use of aspirin: Secondary | ICD-10-CM | POA: Diagnosis not present

## 2023-12-22 DIAGNOSIS — E039 Hypothyroidism, unspecified: Secondary | ICD-10-CM | POA: Diagnosis not present

## 2023-12-22 DIAGNOSIS — D631 Anemia in chronic kidney disease: Secondary | ICD-10-CM | POA: Diagnosis not present

## 2023-12-22 MED ORDER — ISOSORBIDE MONONITRATE ER 120 MG PO TB24
120.0000 mg | ORAL_TABLET | Freq: Every day | ORAL | 0 refills | Status: DC
  Filled 2023-12-22 – 2024-01-03 (×4): qty 90, 90d supply, fill #0
  Filled 2024-01-03 – 2024-01-05 (×2): qty 30, 30d supply, fill #0
  Filled 2024-01-18 – 2024-01-31 (×2): qty 30, 30d supply, fill #1
  Filled 2024-02-09 – 2024-02-27 (×2): qty 30, 30d supply, fill #2

## 2023-12-22 MED ORDER — HYDROXYZINE HCL 10 MG PO TABS
10.0000 mg | ORAL_TABLET | Freq: Three times a day (TID) | ORAL | 0 refills | Status: DC | PRN
  Filled 2023-12-22 – 2024-02-27 (×5): qty 270, 90d supply, fill #0
  Filled ????-??-?? (×2): fill #0

## 2023-12-22 MED ORDER — AMITRIPTYLINE HCL 10 MG PO TABS
ORAL_TABLET | ORAL | 1 refills | Status: DC
  Filled 2023-12-22 – 2024-01-05 (×7): qty 90, 30d supply, fill #0
  Filled 2024-01-18 – 2024-01-31 (×2): qty 90, 30d supply, fill #1

## 2023-12-22 MED ORDER — ATORVASTATIN CALCIUM 80 MG PO TABS
80.0000 mg | ORAL_TABLET | Freq: Every day | ORAL | 0 refills | Status: DC
  Filled 2023-12-22 – 2024-01-18 (×4): qty 90, 90d supply, fill #0
  Filled 2024-02-27: qty 30, 30d supply, fill #0
  Filled 2024-03-21 – 2024-03-22 (×2): qty 30, 30d supply, fill #1
  Filled 2024-04-10 – 2024-04-20 (×3): qty 30, 30d supply, fill #2
  Filled ????-??-?? (×2): fill #0

## 2023-12-22 MED ORDER — PRAMIPEXOLE DIHYDROCHLORIDE 1 MG PO TABS
ORAL_TABLET | ORAL | 0 refills | Status: DC
  Filled 2023-12-22 – 2024-01-18 (×4): qty 90, 90d supply, fill #0
  Filled 2024-02-27: qty 30, 30d supply, fill #0
  Filled 2024-03-21 – 2024-03-22 (×2): qty 30, 30d supply, fill #1
  Filled 2024-04-10 – 2024-04-24 (×4): qty 30, 30d supply, fill #2
  Filled ????-??-?? (×2): fill #0

## 2023-12-22 MED ORDER — VENLAFAXINE HCL ER 75 MG PO CP24
75.0000 mg | ORAL_CAPSULE | Freq: Every day | ORAL | 1 refills | Status: DC
  Filled 2023-12-22 – 2024-01-18 (×4): qty 90, 90d supply, fill #0
  Filled 2024-02-27: qty 30, 30d supply, fill #0
  Filled 2024-03-21 – 2024-03-22 (×2): qty 30, 30d supply, fill #1
  Filled 2024-04-10 – 2024-04-20 (×3): qty 30, 30d supply, fill #2
  Filled 2024-05-09 – 2024-05-17 (×3): qty 30, 30d supply, fill #3
  Filled 2024-06-13: qty 30, 30d supply, fill #4
  Filled 2024-07-19: qty 30, 30d supply, fill #5
  Filled ????-??-?? (×2): fill #0

## 2023-12-22 MED ORDER — NITROGLYCERIN 0.4 MG SL SUBL
SUBLINGUAL_TABLET | SUBLINGUAL | 1 refills | Status: DC
  Filled 2023-12-22: qty 25, fill #0
  Filled 2023-12-26 – 2023-12-28 (×2): qty 25, 5d supply, fill #0
  Filled 2024-01-03: qty 25, 10d supply, fill #0
  Filled 2024-01-03: qty 25, 5d supply, fill #0
  Filled 2024-01-05: qty 25, 8d supply, fill #0
  Filled 2024-01-18 – 2024-01-31 (×2): qty 25, 8d supply, fill #1

## 2023-12-23 ENCOUNTER — Other Ambulatory Visit: Payer: Self-pay

## 2023-12-23 ENCOUNTER — Other Ambulatory Visit (HOSPITAL_COMMUNITY): Payer: Self-pay

## 2023-12-23 ENCOUNTER — Other Ambulatory Visit (HOSPITAL_BASED_OUTPATIENT_CLINIC_OR_DEPARTMENT_OTHER): Payer: Self-pay

## 2023-12-26 ENCOUNTER — Other Ambulatory Visit: Payer: Self-pay

## 2023-12-27 ENCOUNTER — Other Ambulatory Visit (HOSPITAL_BASED_OUTPATIENT_CLINIC_OR_DEPARTMENT_OTHER): Payer: Self-pay

## 2023-12-27 ENCOUNTER — Other Ambulatory Visit (HOSPITAL_COMMUNITY): Payer: Self-pay

## 2023-12-27 ENCOUNTER — Other Ambulatory Visit: Payer: Self-pay

## 2023-12-28 ENCOUNTER — Other Ambulatory Visit (HOSPITAL_COMMUNITY): Payer: Self-pay

## 2023-12-28 ENCOUNTER — Other Ambulatory Visit: Payer: Self-pay

## 2023-12-29 ENCOUNTER — Other Ambulatory Visit: Payer: Self-pay

## 2024-01-03 ENCOUNTER — Other Ambulatory Visit (HOSPITAL_COMMUNITY): Payer: Self-pay

## 2024-01-03 ENCOUNTER — Other Ambulatory Visit: Payer: Self-pay

## 2024-01-04 ENCOUNTER — Telehealth: Payer: Self-pay

## 2024-01-04 ENCOUNTER — Other Ambulatory Visit: Payer: Self-pay

## 2024-01-04 NOTE — Telephone Encounter (Signed)
Called to schedule him for a earlier date.

## 2024-01-05 ENCOUNTER — Other Ambulatory Visit (HOSPITAL_BASED_OUTPATIENT_CLINIC_OR_DEPARTMENT_OTHER): Payer: Self-pay

## 2024-01-05 ENCOUNTER — Other Ambulatory Visit: Payer: Self-pay

## 2024-01-05 ENCOUNTER — Other Ambulatory Visit (HOSPITAL_COMMUNITY): Payer: Self-pay

## 2024-01-06 ENCOUNTER — Other Ambulatory Visit: Payer: Self-pay

## 2024-01-09 ENCOUNTER — Other Ambulatory Visit: Payer: Self-pay

## 2024-01-10 ENCOUNTER — Other Ambulatory Visit (HOSPITAL_BASED_OUTPATIENT_CLINIC_OR_DEPARTMENT_OTHER): Payer: Self-pay

## 2024-01-10 ENCOUNTER — Other Ambulatory Visit: Payer: Self-pay

## 2024-01-13 ENCOUNTER — Other Ambulatory Visit (HOSPITAL_BASED_OUTPATIENT_CLINIC_OR_DEPARTMENT_OTHER): Payer: Self-pay

## 2024-01-18 ENCOUNTER — Other Ambulatory Visit: Payer: Self-pay

## 2024-01-18 ENCOUNTER — Other Ambulatory Visit: Payer: Self-pay | Admitting: Interventional Cardiology

## 2024-01-18 ENCOUNTER — Other Ambulatory Visit (HOSPITAL_BASED_OUTPATIENT_CLINIC_OR_DEPARTMENT_OTHER): Payer: Self-pay

## 2024-01-18 ENCOUNTER — Other Ambulatory Visit (HOSPITAL_COMMUNITY): Payer: Self-pay

## 2024-01-18 ENCOUNTER — Other Ambulatory Visit: Payer: Self-pay | Admitting: Family Medicine

## 2024-01-18 DIAGNOSIS — I4821 Permanent atrial fibrillation: Secondary | ICD-10-CM

## 2024-01-18 DIAGNOSIS — L308 Other specified dermatitis: Secondary | ICD-10-CM

## 2024-01-18 MED ORDER — APIXABAN 5 MG PO TABS
5.0000 mg | ORAL_TABLET | Freq: Two times a day (BID) | ORAL | 5 refills | Status: DC
  Filled 2024-01-18 – 2024-01-31 (×2): qty 60, 30d supply, fill #0
  Filled 2024-02-09 – 2024-02-27 (×2): qty 60, 30d supply, fill #1
  Filled 2024-03-21 – 2024-03-22 (×2): qty 60, 30d supply, fill #2
  Filled 2024-04-10 – 2024-04-20 (×3): qty 60, 30d supply, fill #3
  Filled 2024-05-09 – 2024-05-17 (×3): qty 60, 30d supply, fill #4
  Filled 2024-06-13: qty 60, 30d supply, fill #5

## 2024-01-18 MED ORDER — LEVOCETIRIZINE DIHYDROCHLORIDE 5 MG PO TABS
5.0000 mg | ORAL_TABLET | Freq: Every evening | ORAL | 2 refills | Status: DC
  Filled 2024-01-31: qty 30, 30d supply, fill #0
  Filled 2024-02-09 – 2024-02-27 (×2): qty 30, 30d supply, fill #1
  Filled 2024-03-21 – 2024-03-22 (×2): qty 30, 30d supply, fill #2

## 2024-01-18 NOTE — Telephone Encounter (Signed)
Prescription refill request for Eliquis received. Indication: Afib  Last office visit: 06/22/23 Asa Lente)  Scr: 1.20 (10/19/23)  Age: 78 Weight: 75.9kg  Appropriate dose. Refill sent.

## 2024-01-19 ENCOUNTER — Other Ambulatory Visit: Payer: Self-pay

## 2024-01-19 DIAGNOSIS — S93402A Sprain of unspecified ligament of left ankle, initial encounter: Secondary | ICD-10-CM | POA: Diagnosis not present

## 2024-01-24 ENCOUNTER — Other Ambulatory Visit: Payer: Self-pay | Admitting: Family Medicine

## 2024-01-24 ENCOUNTER — Other Ambulatory Visit: Payer: Self-pay

## 2024-01-24 ENCOUNTER — Other Ambulatory Visit (HOSPITAL_COMMUNITY): Payer: Self-pay

## 2024-01-24 ENCOUNTER — Other Ambulatory Visit (HOSPITAL_BASED_OUTPATIENT_CLINIC_OR_DEPARTMENT_OTHER): Payer: Self-pay

## 2024-01-24 MED ORDER — INSULIN LISPRO (1 UNIT DIAL) 100 UNIT/ML (KWIKPEN)
10.0000 [IU] | PEN_INJECTOR | Freq: Three times a day (TID) | SUBCUTANEOUS | 0 refills | Status: DC
  Filled 2024-01-24 – 2024-02-03 (×2): qty 15, 50d supply, fill #0

## 2024-01-30 ENCOUNTER — Ambulatory Visit (INDEPENDENT_AMBULATORY_CARE_PROVIDER_SITE_OTHER): Payer: Medicare Other

## 2024-01-30 DIAGNOSIS — M2142 Flat foot [pes planus] (acquired), left foot: Secondary | ICD-10-CM | POA: Diagnosis not present

## 2024-01-30 DIAGNOSIS — Z794 Long term (current) use of insulin: Secondary | ICD-10-CM | POA: Diagnosis not present

## 2024-01-30 DIAGNOSIS — M2041 Other hammer toe(s) (acquired), right foot: Secondary | ICD-10-CM

## 2024-01-30 DIAGNOSIS — E1142 Type 2 diabetes mellitus with diabetic polyneuropathy: Secondary | ICD-10-CM | POA: Diagnosis not present

## 2024-01-30 DIAGNOSIS — M2141 Flat foot [pes planus] (acquired), right foot: Secondary | ICD-10-CM

## 2024-01-30 DIAGNOSIS — M2042 Other hammer toe(s) (acquired), left foot: Secondary | ICD-10-CM | POA: Diagnosis not present

## 2024-01-30 NOTE — Progress Notes (Signed)
 Patient presents today to pick up diabetic shoes and insoles.  Patient was dispensed 1 pair of diabetic shoes and 3 pairs of foam casted diabetic insoles. Fit was satisfactory. Instructions for break-in and wear was reviewed and a copy was given to the patient.   Re-appointment for regularly scheduled diabetic foot care visits or if they should experience any trouble with the shoes or insoles.   Patient was very happy with fit and function of shoes and inserts  Patient will call office if any problems  Arise   Addison Bailey Cped, CFo, CFm

## 2024-01-31 ENCOUNTER — Other Ambulatory Visit: Payer: Self-pay

## 2024-01-31 ENCOUNTER — Other Ambulatory Visit (HOSPITAL_COMMUNITY): Payer: Self-pay

## 2024-01-31 ENCOUNTER — Other Ambulatory Visit: Payer: Self-pay | Admitting: Neurology

## 2024-02-01 ENCOUNTER — Other Ambulatory Visit: Payer: Self-pay

## 2024-02-01 ENCOUNTER — Other Ambulatory Visit (HOSPITAL_COMMUNITY): Payer: Self-pay

## 2024-02-01 ENCOUNTER — Other Ambulatory Visit: Payer: Self-pay | Admitting: Family Medicine

## 2024-02-01 MED ORDER — MEMANTINE HCL 10 MG PO TABS
10.0000 mg | ORAL_TABLET | Freq: Two times a day (BID) | ORAL | 11 refills | Status: AC
  Filled 2024-02-01: qty 60, 30d supply, fill #0
  Filled 2024-02-09 – 2024-02-27 (×2): qty 60, 30d supply, fill #1
  Filled 2024-03-21 – 2024-03-22 (×2): qty 60, 30d supply, fill #2
  Filled 2024-04-10 – 2024-04-20 (×3): qty 60, 30d supply, fill #3
  Filled 2024-05-09 – 2024-05-17 (×3): qty 60, 30d supply, fill #4
  Filled 2024-06-13: qty 60, 30d supply, fill #5
  Filled 2024-07-19: qty 60, 30d supply, fill #6
  Filled 2024-08-21 – 2024-08-28 (×3): qty 60, 30d supply, fill #7
  Filled 2024-09-17 – 2024-09-24 (×2): qty 60, 30d supply, fill #8
  Filled 2024-10-23: qty 60, 30d supply, fill #9
  Filled 2024-11-20: qty 60, 30d supply, fill #10
  Filled 2024-12-23: qty 60, 30d supply, fill #11

## 2024-02-02 ENCOUNTER — Encounter: Payer: Self-pay | Admitting: Neurology

## 2024-02-02 ENCOUNTER — Other Ambulatory Visit: Payer: Self-pay

## 2024-02-02 ENCOUNTER — Other Ambulatory Visit (HOSPITAL_BASED_OUTPATIENT_CLINIC_OR_DEPARTMENT_OTHER): Payer: Self-pay

## 2024-02-02 ENCOUNTER — Ambulatory Visit: Payer: Medicare Other | Admitting: Neurology

## 2024-02-02 VITALS — BP 131/69 | HR 72 | Ht 68.0 in | Wt 170.0 lb

## 2024-02-02 DIAGNOSIS — F02A0 Dementia in other diseases classified elsewhere, mild, without behavioral disturbance, psychotic disturbance, mood disturbance, and anxiety: Secondary | ICD-10-CM | POA: Diagnosis not present

## 2024-02-02 DIAGNOSIS — E1142 Type 2 diabetes mellitus with diabetic polyneuropathy: Secondary | ICD-10-CM

## 2024-02-02 DIAGNOSIS — G301 Alzheimer's disease with late onset: Secondary | ICD-10-CM | POA: Diagnosis not present

## 2024-02-02 DIAGNOSIS — Z794 Long term (current) use of insulin: Secondary | ICD-10-CM | POA: Diagnosis not present

## 2024-02-02 DIAGNOSIS — W19XXXD Unspecified fall, subsequent encounter: Secondary | ICD-10-CM | POA: Diagnosis not present

## 2024-02-02 DIAGNOSIS — R2681 Unsteadiness on feet: Secondary | ICD-10-CM

## 2024-02-02 NOTE — Progress Notes (Signed)
 [facility]  PATIENT: [name] [name] DOB: 1946/05/19  REQUESTING CLINICIAN: Bradd Canary, MD HISTORY FROM: Patient and daughter REASON FOR VISIT: Worsening memory    HISTORICAL  CHIEF COMPLAINT:  Chief Complaint  Patient presents with   Room 13    Pt is here with his Daughter. Pt states that his headaches has cleared up. Pt states that both of his legs are weak. Pt's daughter states that pt has had 2 falls and the last one was 2 weeks ago. Pt's daughter states that pt went to the Hospital in November for a fall. Pt's Daughter states that she would like to discuss if pt needs a MRI.     INTERVAL HISTORY 02/02/2024:  Patient presents today for follow-up, he is accompanied by daughter.  Last visit was in July.  Since then, his memory continues to get worse.  He is also having multiple falls.  Patient was recently admitted in the hospital in November for fall complicated by rib fractures.  Since then he has been living with her daughter.  Daughter is helping with his medications, helping with food and he has been stable.  She also mentioned that she when he was taking opioid for his broken ribs, he was having more hallucination, visual distortion and worse memory.  Since completing the course of pain meds he has been doing better, his mind is clear.  He also completed physical therapy.  He does use a cane with ambulation in the house and a walker outside the house.   INTERVAL HISTORY 06/20/2023:  Patient presents today for follow-up, he is accompanied by daughter.  At last visit we confirmed his cognitive impairment likely due to Alzheimer disease.  Today he is presenting with worsening gait and dizziness.  He has diabetes and is diabetes is uncontrolled his last A1c is 10 and his glucose on BMP July 1 was more than 500.  On exam today he does have peripheral neuropathy.  I did explain to the patient that his dizziness might be multifactorial and related to dehydration  from uncontrolled diabetes and peripheral neuropathy.  He is set to start physical therapy for gait training .  He is also on Aricept and has side effect of diarrhea we will discontinue it.   INTERVAL HISTORY 03/15/2023:  Patient presents today for follow-up, he is accompanied by her daughter who had questions regarding the diagnosis.  His last visit was in February.  At that time we refer him to home PT for gait abnormality.  We also completed his ATN profile which was positive for Alzheimer dementia biomarkers.  He is on Namenda and Aricept.  He does complaints of some gait instability, feeling like he is drunk but this is improving. He is doing PT, no recent falls   INTERVAL HISTORY 01/27/2023:  Patient presents today for follow-up, last visit was a year ago in March.  He is accompanied today by daughter.  Since last visit daughter reports that his memory got worse.  He cannot remember nothing.  Patient is aware also that his memory is worse.  He reports that he can go to the kitchen and does not know why he came to the kitchen or what he needs to do.  On top of that, daughter reports that he had multiple falls, and also have sundowning effect.  He might get agitated at night.   HISTORY OF PRESENT ILLNESS:  Patient presents today for follow-up, he is accompanied by his daughter.  Last visit  was in August 2022.  Since then daughter has reported that patient memory got worse.  He does repeat himself, asking the same questions over and over, daughter reports trouble with short-term memory, sometimes gets frustrated with his memory.  He is still having hallucinations, visual only, and now he is getting paranoid. At time, he will call daughter by the wife's name who is deceased in 08-21-2021.  He still able to cook, clean, pays his bills, he does not drive, does have word finding difficulty and sometimes calling daughter by the wrong name.  Other than that he remains active, does a lot in the house.  He  has occasional dizziness for which he will take meclizine.  Daughter has reported that the dizziness mostly happens after patient exerts himself or stand all morning cooking and cleaning.  Denies any falls recently.    PREVIOUS HISTORY FROM DR. WILLIS 07/07/2021:  [name] is a 78 year old right-handed white male with a history of mild memory changes that date back several years. The patient was seen and evaluated by Dr. Everlena Cooper in 2016 for some cognitive changes felt associated with prior concussions. The patient underwent neuropsychological evaluation in 2018, the results suggest that the patient did not have an organic dementia at that time.  The patient has had some history of seizure type events, he was on Depakote for a number of years, but sometime since 2020, he went off the Depakote.  The patient now reports that he is having headaches with some regularity, at least 1 or 2 headaches a week.  The patient has not had any recent seizures.  He does have a history of diabetes, his most recent hemoglobin A1c was 7.2.  He reports that he chronically has insomnia, he will go to bed around 10:30 PM or 11 PM and wakes up around 330 or 4 AM and cannot get back to sleep.  He has drowsiness during the day, he will oftentimes take naps.  He has chronic fatigue.  He reports some short-term memory, he cannot remember recent events. He still drives a car but occasionally he will get lost.  He is able to keep up with his medications and appointments, he does some of the finances but his wife does most of them.  He does report some gait instability, he has significant right knee arthritis and will be having knee surgery for total knee replacement next week.  He denies any significant numbness of the feet.  He denies any focal weakness.  A recent CT scan of the brain does show some evidence of cerebrovascular disease including a left pontine stroke event.  He comes to this office for further evaluation.   OTHER MEDICAL  CONDITIONS: Atrial fibrillation on Eliquis, CAD sp quadruple bypass, Diabetes, Hypothyroidism, anxiety depression   REVIEW OF SYSTEMS: Full 14 system review of systems performed and negative with exception of: as  noted in the HPI,  ALLERGIES: Allergies  Allergen Reactions   Morphine Other (See Comments)    Hallucinations   Shellfish-Derived Products Itching    HOME MEDICATIONS: Outpatient Medications Prior to Visit  Medication Sig Dispense Refill   amiodarone (PACERONE) 200 MG tablet Take 1 tablet (200 mg total) by mouth daily. 90 tablet 1   amitriptyline (ELAVIL) 10 MG tablet Take 1-3 tablets (10-30 mg total) by mouth at bedtime. 90 tablet 1   apixaban (ELIQUIS) 5 MG TABS tablet Take 1 tablet (5 mg total) by mouth 2 (two) times daily. 60 tablet 5  aspirin EC 81 MG tablet Take 81 mg by mouth daily. Swallow whole.     atorvastatin (LIPITOR) 80 MG tablet Take 1 tablet (80 mg total) by mouth daily. 90 tablet 0   Continuous Glucose Sensor (FREESTYLE LIBRE 3 SENSOR) MISC Place 1 sensor on the skin every 14 days. Use to check glucose continuously 6 each 3   dapagliflozin propanediol (FARXIGA) 10 MG TABS tablet Take 1 tablet (10 mg total) by mouth daily. 90 tablet 3   hydrOXYzine (ATARAX) 10 MG tablet Take 1 tablet (10 mg total) by mouth 3 (three) times daily as needed for anxiety or itching. 270 tablet 0   insulin glargine, 2 Unit Dial, (TOUJEO MAX SOLOSTAR) 300 UNIT/ML Solostar Pen Inject 60 Units into the skin daily in the afternoon. 30 mL 2   insulin lispro (HUMALOG KWIKPEN) 100 UNIT/ML KwikPen Inject 10 Units into the skin 3 (three) times daily. (Patient taking differently: Inject 12 Units into the skin 3 (three) times daily. Plus correction blood glucose- 130 / 25) 45 mL 3   insulin lispro (HUMALOG KWIKPEN) 100 UNIT/ML KwikPen Inject 10 Units into the skin 3 (three) times daily. 15 mL 0   isosorbide mononitrate (IMDUR) 120 MG 24 hr tablet Take 1 tablet (120 mg total) by mouth daily. 90  tablet 0   levocetirizine (XYZAL) 5 MG tablet Take 1 tablet (5 mg total) by mouth every evening. 30 tablet 2   levothyroxine (SYNTHROID) 125 MCG tablet Take 1 tablet (125 mcg total) by mouth daily. 90 tablet 3   memantine (NAMENDA) 10 MG tablet Take 1 tablet (10 mg total) by mouth 2 (two) times daily. 60 tablet 11   nitroGLYCERIN (NITROSTAT) 0.4 MG SL tablet PLACE 1 TABLET UNDER THE TONGUE EVERY 5 MINUTES AS NEEDED FOR CHEST PAIN. 25 tablet 1   polyethylene glycol (MIRALAX / GLYCOLAX) 17 g packet Take 17 g by mouth daily as needed (constipation).     pramipexole (MIRAPEX) 1 MG tablet Take 1/2 to 1 tablet (0.5-1 mg total) by mouth at bedtime. 90 tablet 0   QUEtiapine (SEROQUEL) 25 MG tablet Take 1 tablet (25 mg total) by mouth at bedtime. 30 tablet 6   ranolazine (RANEXA) 500 MG 12 hr tablet Take 1 tablet (500 mg total) by mouth 2 (two) times daily. 180 tablet 1   sertraline (ZOLOFT) 100 MG tablet Take 1 tablet (100 mg total) by mouth daily. 90 tablet 0   venlafaxine XR (EFFEXOR XR) 75 MG 24 hr capsule Take 1 capsule (75 mg total) by mouth daily with breakfast. 90 capsule 1   acetaminophen (TYLENOL) 500 MG tablet Take 2 tablets (1,000 mg total) by mouth every 6 (six) hours as needed for mild pain (pain score 1-3).     docusate sodium (COLACE) 100 MG capsule Take 1 capsule (100 mg total) by mouth 2 (two) times daily as needed for mild constipation or moderate constipation.     glucose blood (ACCU-CHEK AVIVA) test strip Use as directed to check blood sugar 4 times daily.  DX E11.9 200 each 6   hydrocortisone 2.5 % cream Apply 1 Application topically 2 (two) times daily.     Insulin Pen Needle 31G X 5 MM MISC Use as directed to inject insulin in the morning, at noon, in the evening, and at bedtime. 400 each 3   lidocaine (LIDODERM) 5 % Place 2 patches onto the skin daily. Remove & Discard patch within 12 hours or as directed by MD     oxyCODONE (  OXY IR/ROXICODONE) 5 MG immediate release tablet Take 1  tablet (5 mg total) by mouth 2 (two) times daily as needed. 40 tablet 0   No facility-administered medications prior to visit.    PAST MEDICAL HISTORY: Past Medical History:  Diagnosis Date   Acquired atrophy of thyroid 06/05/2013   Overview:  July 2014: controlled on synthroid since 2012 May 2015: decreased to Synthroid 50 mcg  Last Assessment & Plan:  Pt doing well with most recent TSH within normal range.  Will continue current dosage of Synthroid . Pt reminded to take medication on empty stomach. Will continue to monitor with periodic laboratory assessment in 3 months.    Anemia    hemoglobin 7.4, iron deficiency, January, 2011, 2 unit transfusion, endoscopy normal, capsule endoscopy February, 2011 no small bowel abnormalities.   Most likely source gastric erosions, followed by GI   Anxiety    Bradycardia    CAD (coronary artery disease)    A. CABG in 2000,status post cardiac cath in 2006, 2009 ....continued chest pain and SOB despite oral medication adjestments including Ranexa. B. Cath November 2009/ mRCA - 2.75 x 23 Abbott Xience V drug-eluting stent ...11/26/2008 to distal  RCA leading to acute marginal.  C. Cath 07/2012 for CP - stable anatomy, med rx. d. cath 2015 and 05/30/2015 stable anatomy, consider Myoview if has CP again   Carotid artery disease (HCC) 08/01/2015   Doppler, May 29, 2015, 1-39% bilateral ICA    Cerebral ischemia    MRI November, 2010, chronic microvascular ischemia   CKD (chronic kidney disease), stage III (HCC)    Concussion    Depression    Bipolar   Diabetes mellitus (HCC)    Edema    Essential hypertension 06/05/2013   Overview:  July 2014: Controlled with Bystolic Sep 2015: Changed to atenolol.  Last Assessment & Plan:  Will change to atenolol for cost and follow Improved.  Medication compliance strongly encouraged BP: 116/64 mmHg    Overview:  July 2014: Controlled with Bystolic Sep 2015: Changed to atenolol.  Last Assessment & Plan:  Will  change to atenolol for cost and follow   Falling episodes    these have occurred in the past and again recurring 2011   Family history of adverse reaction to anesthesia    "mother died during bypass surgery but not sure if it has to do with anesthesia"   Gastric ulcer    GERD (gastroesophageal reflux disease)    H/O medication noncompliance    Due to loss of insurance   H/O multiple concussions    Hard of hearing    Heart murmur    History of blood transfusion 12/20/2013   History of kidney stones    Hx of CABG    2000,  / one median sternotomy suture broken her chest x-ray November, 2010, no clinical significance   Hyperlipidemia    Hypertension    pt. denies   Iron deficiency anemia    Long term (current) use of anticoagulants [Z79.01] 07/20/2016   Low back pain 06/12/2009   Qualifier: Diagnosis of  By: Nena Jordan    Nephrolithiasis    Orthostasis    OSA (obstructive sleep apnea)    PAF (paroxysmal atrial fibrillation) (HCC)    a. dx 2017, started on amiodarone/warfarin but patient intermittently noncompliant.   PSVT (paroxysmal supraventricular tachycardia) (HCC)    RBBB 07/09/2009   Qualifier: Diagnosis of  By: Myrtis Ser, MD, Ruthann Cancer Lemmie Evens  RLS (restless legs syndrome) 09/19/2009   Qualifier: Diagnosis of  By: Nena Jordan   Overview:  July 2014: Controlled with Mirapex  Last Assessment & Plan:  Patient is doing well. Will continue current management and follow clinically.   Seizure disorder (HCC) 01/09/2017   Spondylosis    C5-6, C6-7 MRI 2010   Syncope 03/2016   TBI (traumatic brain injury) (HCC) 2019   Thyroid disease    Tubulovillous adenoma of colon 2007   Vitamin D deficiency 05/17/2017   Wears glasses     PAST SURGICAL HISTORY: Past Surgical History:  Procedure Laterality Date   ANTERIOR CERVICAL DECOMP/DISCECTOMY FUSION N/A 05/31/2016   Procedure: ANTERIOR CERVICAL DECOMPRESSION/DISCECTOMY FUSION CERVICAL FIVE-SIX,CERVICAL SIX-SEVEN;   Surgeon: Julio Sicks, MD;  Location: [facility];  Service: Neurosurgery;  Laterality: N/A;   CARDIAC CATHETERIZATION N/A 05/30/2015   Procedure: Left Heart Cath and Coronary Angiography;  Surgeon: Marykay Lex, MD;  [facility] INVASIVE CV LAB;  Service: Cardiovascular;  Laterality: N/A;   CATARACT EXTRACTION     COLONOSCOPY     CORONARY ARTERY BYPASS GRAFT     2000   ESOPHAGOGASTRODUODENOSCOPY     LEFT HEART CATH AND CORS/GRAFTS ANGIOGRAPHY N/A 12/15/2017   Procedure: LEFT HEART CATH AND CORS/GRAFTS ANGIOGRAPHY;  Surgeon: Corky Crafts, MD;  Location: [facility] INVASIVE CV LAB;  Service: Cardiovascular;  Laterality: N/A;   LEFT HEART CATHETERIZATION WITH CORONARY/GRAFT ANGIOGRAM N/A 08/01/2012   Procedure: LEFT HEART CATHETERIZATION WITH Isabel Caprice;  Surgeon: Herby Abraham, MD;  Location: [facility] CATH LAB;  Service: Cardiovascular;  Laterality: N/A;   LEFT HEART CATHETERIZATION WITH CORONARY/GRAFT ANGIOGRAM N/A 01/03/2015   Procedure: LEFT HEART CATHETERIZATION WITH Isabel Caprice;  Surgeon: Runell Gess, MD;  Location: [facility] CATH LAB;  Service: Cardiovascular;  Laterality: N/A;   NASAL SEPTUM SURGERY     UP3   PERCUTANEOUS CORONARY STENT INTERVENTION (PCI-S)  10/06/2008   mRCA PCI  2.75 x 23 Abbott Xience V drug-eluting stent    RIGHT/LEFT HEART CATH AND CORONARY/GRAFT ANGIOGRAPHY N/A 01/07/2020   Procedure: RIGHT/LEFT HEART CATH AND CORONARY/GRAFT ANGIOGRAPHY;  Surgeon: Lyn Records, MD;  Location: [facility] INVASIVE CV LAB;  Service: Cardiovascular;  Laterality: N/A;   TOTAL KNEE ARTHROPLASTY Right 09/22/2021   Procedure: RIGHT TOTAL KNEE ARTHROPLASTY;  Surgeon: Marcene Corning, MD;  Location: [facility] ORS;  Service: Orthopedics;  Laterality: Right;   ULTRASOUND GUIDANCE FOR VASCULAR ACCESS  12/15/2017   Procedure: Ultrasound Guidance For Vascular Access;  Surgeon: Corky Crafts, MD;  Location: [facility] INVASIVE CV LAB;  Service: Cardiovascular;;    FAMILY  HISTORY: Family History  Problem Relation Age of Onset   Pancreatic cancer Brother    Diabetes Brother    Coronary artery disease Brother    Stroke Brother    Diabetes Brother    Diabetes Mother    Heart failure Mother    Heart failure Father    Hypothyroidism Brother    Coronary artery disease Brother    Other Brother        colon surgery   Heart attack Other        Nephew   Irregular heart beat Daughter    Cancer Maternal Grandmother        unknown    Colon cancer Neg Hx    Stomach cancer Neg Hx    Liver cancer Neg Hx    Rectal cancer Neg Hx    Esophageal cancer Neg Hx     SOCIAL HISTORY: Social  History   Socioeconomic History   Marital status: Married    Spouse name: Not on file   Number of children: 4   Years of education: 13   Highest education level: Not on file  Occupational History   Occupation: retired  Tobacco Use   Smoking status: Never    Passive exposure: Never   Smokeless tobacco: Never  Vaping Use   Vaping status: Never Used  Substance and Sexual Activity   Alcohol use: No    Alcohol/week: 0.0 standard drinks of alcohol    Comment: stopped drinking in 1998   Drug use: No   Sexual activity: Not Currently    Partners: Female  Other Topics Concern   Not on file  Social History Narrative   Patient is right handed.   Patient does not drink caffeine.   Social Drivers of Corporate investment banker Strain: Low Risk  (05/24/2023)   Overall Financial Resource Strain (CARDIA)    Difficulty of Paying Living Expenses: Not very hard  Food Insecurity: No Food Insecurity (11/01/2023)   Hunger Vital Sign    Worried About Running Out of Food in the Last Year: Never true    Ran Out of Food in the Last Year: Never true  Transportation Needs: No Transportation Needs (11/01/2023)   PRAPARE - Administrator, Civil Service (Medical): No    Lack of Transportation (Non-Medical): No  Physical Activity: Insufficiently Active (08/02/2022)   Exercise  Vital Sign    Days of Exercise per Week: 7 days    Minutes of Exercise per Session: 20 min  Stress: No Stress Concern Present (05/30/2023)   [employer] of Occupational Health - Occupational Stress Questionnaire    Feeling of Stress : Only a little  Social Connections: Moderately Isolated (08/02/2022)   Social Connection and Isolation Panel [NHANES]    Frequency of Communication with Friends and Family: More than three times a week    Frequency of Social Gatherings with Friends and Family: More than three times a week    Attends Religious Services: More than 4 times per year    Active Member of [employer] or Organizations: No    Attends Banker Meetings: Never    Marital Status: Widowed  Intimate Partner Violence: Unknown (10/15/2023)   Humiliation, Afraid, Rape, and Kick questionnaire    Fear of Current or Ex-Partner: Patient declined    Emotionally Abused: No    Physically Abused: No    Sexually Abused: No    PHYSICAL EXAM  GENERAL EXAM/CONSTITUTIONAL: Vitals:  Vitals:   02/02/24 1106  BP: 131/69  Pulse: 72  Weight: 170 lb (77.1 kg)  Height: 5\' 8"  (1.727 m)    Body mass index is 25.85 kg/m. Wt Readings from Last 3 Encounters:  02/02/24 170 lb (77.1 kg)  10/28/23 167 lb 6 oz (75.9 kg)  10/14/23 167 lb 1.7 oz (75.8 kg)   Patient is in no distress; well developed, nourished and groomed; neck is supple  MUSCULOSKELETAL: Gait, strength, tone, movements noted in Neurologic exam below  NEUROLOGIC: MENTAL STATUS:     02/02/2024   11:07 AM 12/21/2022   10:41 AM 07/07/2021   10:16 AM  MMSE - Mini Mental State Exam  Orientation to time 2 4 4   Orientation to Place 5 4 4   Registration 3 3 3   Attention/ Calculation 0 0 1  Recall 1 3 3   Language- name 2 objects 2 2 2   Language- repeat 0 1 0  Language- follow 3 step command 3 3 3   Language- read & follow direction 1 1 1   Write a sentence 1 1 1   Copy design 0 1 0  Total score 18 23 22      CRANIAL NERVE:   2nd, 3rd, 4th, 6th - visual fields full to confrontation, extraocular muscles intact, no nystagmus 5th - facial sensation symmetric 7th - facial strength symmetric 8th - hearing intact 9th - palate elevates symmetrically, uvula midline 11th - shoulder shrug symmetric 12th - tongue protrusion midline  MOTOR:  normal bulk and tone, full strength in the BUE, BLE  SENSORY:  normal and symmetric to light touch and pinprick but decrease vibration bilateral feet up to ankles.   COORDINATION:  finger-nose-finger, fine finger movements normal  GAIT/STATION:   Slow, wide base. He does have a positive romberg.   Uses a cane with ambulation    DIAGNOSTIC DATA (LABS, IMAGING, TESTING) - I reviewed patient records, labs, notes, testing and imaging myself where available.  Lab Results  Component Value Date   WBC 7.9 10/18/2023   HGB 12.6 (L) 10/18/2023   HCT 36.8 (L) 10/18/2023   MCV 88.9 10/18/2023   PLT 150 10/18/2023      Component Value Date/Time   NA 139 10/19/2023 1008   NA 134 10/23/2020 1054   K 4.2 10/19/2023 1008   CL 109 10/19/2023 1008   CO2 23 10/19/2023 1008   GLUCOSE 143 (H) 10/19/2023 1008   BUN 15 10/19/2023 1008   BUN 22 10/23/2020 1054   CREATININE 1.20 10/19/2023 1008   CREATININE 2.02 (H) 09/15/2020 0855   CALCIUM 9.0 10/19/2023 1008   PROT 6.5 10/14/2023 1949   PROT 6.8 09/27/2017 0823   ALBUMIN 3.8 10/14/2023 1949   ALBUMIN 4.4 09/27/2017 0823   AST 44 (H) 10/14/2023 1949   ALT 53 (H) 10/14/2023 1949   ALKPHOS 77 10/14/2023 1949   BILITOT 0.7 10/14/2023 1949   BILITOT 0.6 09/27/2017 0823   GFRNONAA >60 10/19/2023 1008   GFRAA 38 (L) 10/23/2020 1054   Lab Results  Component Value Date   CHOL 95 11/12/2022   HDL 41.00 11/12/2022   LDLCALC 37 11/12/2022   LDLDIRECT 77.0 08/20/2019   TRIG 81.0 11/12/2022   CHOLHDL 2 11/12/2022   Lab Results  Component Value Date   HGBA1C 10.3 (H) 09/01/2023   Lab Results  Component Value Date   VITAMINB12  523 09/01/2023   Lab Results  Component Value Date   TSH 10.12 (H) 09/01/2023    Head CT 02/12/2022 1. No evidence of acute intracranial abnormality. 2. Similar atrophy and chronic microvascular ischemic disease.  MRI Brain 10/16/2023 No acute intracranial process. No evidence of acute or subacute infarct.  ATN Profile positive for presence for Alzheimer disease biomarker's    ASSESSMENT AND PLAN  78 y.o. year old male with multiple medical conditions including atrial fibrillation, coronary artery disease, diabetes, hypertension, hyperlipidemia, dementia who is presenting for follow-up for mild dementia and gait and balance issues.  For his mild dementia, he is stable, on Namenda.  Daughter is helping with the medications.  Explained to daughter that unfortunately with this disease, over time his memory will get worse. Will continue Namenda for now.  For his neuropathy, she is still having gait abnormality and fall.  Advised him to use device with ambulation and to control his diabetes. If his diabetes continue to get worse, his neuropathy will also get worse.  They are both understanding.  I will  see him in 1 year for follow-up or sooner if worse.   1. Mild late onset Alzheimer's dementia without behavioral disturbance, psychotic disturbance, mood disturbance, or anxiety (HCC)   2. Diabetic peripheral neuropathy (HCC)   3. Fall, subsequent encounter   4. Gait instability      Patient Instructions  Continue current medications Please make sure to use a device with ambulation Continue to follow with PCP Return in 1 year or sooner if worse.  No orders of the defined types were placed in this encounter.   No orders of the defined types were placed in this encounter.   Return in about 1 year (around 02/01/2025).   I have spent a total of 43 minutes dedicated to this patient today, preparing to see patient, performing a medically appropriate examination and evaluation, ordering  tests and/or medications and procedures, and counseling and educating the patient/family/caregiver; independently interpreting result and communicating results to the family/patient/caregiver; and documenting clinical information in the electronic medical record.    Windell Norfolk, MD 02/02/2024, 5:31 PM  [facility] [address], [address] [place], [place] [address] [phone]

## 2024-02-02 NOTE — Patient Instructions (Signed)
 Continue current medications Please make sure to use a device with ambulation Continue to follow with PCP Return in 1 year or sooner if worse.

## 2024-02-03 ENCOUNTER — Other Ambulatory Visit: Payer: Self-pay

## 2024-02-03 ENCOUNTER — Other Ambulatory Visit (HOSPITAL_COMMUNITY): Payer: Self-pay

## 2024-02-03 ENCOUNTER — Other Ambulatory Visit (HOSPITAL_BASED_OUTPATIENT_CLINIC_OR_DEPARTMENT_OTHER): Payer: Self-pay

## 2024-02-06 ENCOUNTER — Other Ambulatory Visit: Payer: Self-pay

## 2024-02-07 ENCOUNTER — Emergency Department (HOSPITAL_COMMUNITY)

## 2024-02-07 ENCOUNTER — Encounter (HOSPITAL_COMMUNITY): Payer: Self-pay

## 2024-02-07 ENCOUNTER — Observation Stay (HOSPITAL_COMMUNITY)
Admission: EM | Admit: 2024-02-07 | Discharge: 2024-02-08 | Disposition: A | Discharge status: Home / Self Care | Attending: Internal Medicine | Admitting: Internal Medicine

## 2024-02-07 ENCOUNTER — Other Ambulatory Visit: Payer: Self-pay

## 2024-02-07 DIAGNOSIS — Z7901 Long term (current) use of anticoagulants: Secondary | ICD-10-CM | POA: Diagnosis not present

## 2024-02-07 DIAGNOSIS — E039 Hypothyroidism, unspecified: Secondary | ICD-10-CM | POA: Diagnosis not present

## 2024-02-07 DIAGNOSIS — Q600 Renal agenesis, unilateral: Secondary | ICD-10-CM | POA: Diagnosis not present

## 2024-02-07 DIAGNOSIS — R0789 Other chest pain: Secondary | ICD-10-CM | POA: Diagnosis not present

## 2024-02-07 DIAGNOSIS — E785 Hyperlipidemia, unspecified: Secondary | ICD-10-CM | POA: Insufficient documentation

## 2024-02-07 DIAGNOSIS — N183 Chronic kidney disease, stage 3 unspecified: Secondary | ICD-10-CM | POA: Diagnosis present

## 2024-02-07 DIAGNOSIS — I517 Cardiomegaly: Secondary | ICD-10-CM | POA: Diagnosis not present

## 2024-02-07 DIAGNOSIS — I480 Paroxysmal atrial fibrillation: Secondary | ICD-10-CM | POA: Diagnosis not present

## 2024-02-07 DIAGNOSIS — Z7982 Long term (current) use of aspirin: Secondary | ICD-10-CM | POA: Diagnosis not present

## 2024-02-07 DIAGNOSIS — Z951 Presence of aortocoronary bypass graft: Secondary | ICD-10-CM | POA: Diagnosis not present

## 2024-02-07 DIAGNOSIS — Z1152 Encounter for screening for COVID-19: Secondary | ICD-10-CM | POA: Insufficient documentation

## 2024-02-07 DIAGNOSIS — Z96651 Presence of right artificial knee joint: Secondary | ICD-10-CM | POA: Diagnosis not present

## 2024-02-07 DIAGNOSIS — R748 Abnormal levels of other serum enzymes: Secondary | ICD-10-CM | POA: Diagnosis present

## 2024-02-07 DIAGNOSIS — E1165 Type 2 diabetes mellitus with hyperglycemia: Secondary | ICD-10-CM | POA: Diagnosis not present

## 2024-02-07 DIAGNOSIS — I130 Hypertensive heart and chronic kidney disease with heart failure and stage 1 through stage 4 chronic kidney disease, or unspecified chronic kidney disease: Secondary | ICD-10-CM | POA: Insufficient documentation

## 2024-02-07 DIAGNOSIS — R413 Other amnesia: Secondary | ICD-10-CM | POA: Diagnosis not present

## 2024-02-07 DIAGNOSIS — Z794 Long term (current) use of insulin: Secondary | ICD-10-CM | POA: Diagnosis not present

## 2024-02-07 DIAGNOSIS — G2581 Restless legs syndrome: Secondary | ICD-10-CM | POA: Diagnosis not present

## 2024-02-07 DIAGNOSIS — I509 Heart failure, unspecified: Secondary | ICD-10-CM | POA: Insufficient documentation

## 2024-02-07 DIAGNOSIS — N200 Calculus of kidney: Secondary | ICD-10-CM | POA: Diagnosis not present

## 2024-02-07 DIAGNOSIS — R079 Chest pain, unspecified: Principal | ICD-10-CM | POA: Diagnosis present

## 2024-02-07 DIAGNOSIS — I2510 Atherosclerotic heart disease of native coronary artery without angina pectoris: Secondary | ICD-10-CM | POA: Diagnosis not present

## 2024-02-07 DIAGNOSIS — Z79899 Other long term (current) drug therapy: Secondary | ICD-10-CM | POA: Insufficient documentation

## 2024-02-07 DIAGNOSIS — E1122 Type 2 diabetes mellitus with diabetic chronic kidney disease: Secondary | ICD-10-CM | POA: Diagnosis not present

## 2024-02-07 DIAGNOSIS — N1831 Chronic kidney disease, stage 3a: Secondary | ICD-10-CM | POA: Diagnosis not present

## 2024-02-07 DIAGNOSIS — F0390 Unspecified dementia without behavioral disturbance: Secondary | ICD-10-CM | POA: Insufficient documentation

## 2024-02-07 DIAGNOSIS — E119 Type 2 diabetes mellitus without complications: Secondary | ICD-10-CM

## 2024-02-07 DIAGNOSIS — M79603 Pain in arm, unspecified: Secondary | ICD-10-CM | POA: Diagnosis not present

## 2024-02-07 LAB — COMPREHENSIVE METABOLIC PANEL
ALT: 42 U/L (ref 0–44)
AST: 33 U/L (ref 15–41)
Albumin: 4.1 g/dL (ref 3.5–5.0)
Alkaline Phosphatase: 94 U/L (ref 38–126)
Anion gap: 11 (ref 5–15)
BUN: 29 mg/dL — ABNORMAL HIGH (ref 8–23)
CO2: 23 mmol/L (ref 22–32)
Calcium: 9.4 mg/dL (ref 8.9–10.3)
Chloride: 101 mmol/L (ref 98–111)
Creatinine, Ser: 1.4 mg/dL — ABNORMAL HIGH (ref 0.61–1.24)
GFR, Estimated: 52 mL/min — ABNORMAL LOW (ref >60)
Glucose, Bld: 178 mg/dL — ABNORMAL HIGH (ref 70–99)
Potassium: 4.8 mmol/L (ref 3.5–5.1)
Sodium: 135 mmol/L (ref 135–145)
Total Bilirubin: 1.2 mg/dL (ref 0.0–1.2)
Total Protein: 7.1 g/dL (ref 6.5–8.1)

## 2024-02-07 LAB — TROPONIN I (HIGH SENSITIVITY)
Troponin I (High Sensitivity): 6 ng/L (ref <18)
Troponin I (High Sensitivity): 7 ng/L (ref <18)

## 2024-02-07 LAB — LIPASE, BLOOD: Lipase: 60 U/L — ABNORMAL HIGH (ref 11–51)

## 2024-02-07 LAB — CBC
HCT: 45.4 % (ref 39.0–52.0)
Hemoglobin: 15.8 g/dL (ref 13.0–17.0)
MCH: 31 pg (ref 26.0–34.0)
MCHC: 34.8 g/dL (ref 30.0–36.0)
MCV: 89 fL (ref 80.0–100.0)
Platelets: 167 10*3/uL (ref 150–400)
RBC: 5.1 MIL/uL (ref 4.22–5.81)
RDW: 14.6 % (ref 11.5–15.5)
WBC: 10.6 10*3/uL — ABNORMAL HIGH (ref 4.0–10.5)
nRBC: 0 % (ref 0.0–0.2)

## 2024-02-07 MED ORDER — ONDANSETRON 4 MG PO TBDP: 4.0000 mg | ORAL_TABLET | Freq: Once | ORAL | Status: DC

## 2024-02-07 MED ORDER — IOHEXOL 350 MG/ML SOLN
75.0000 mL | Freq: Once | INTRAVENOUS | Status: AC | PRN
  Administered 2024-02-07: 75 mL via INTRAVENOUS

## 2024-02-07 NOTE — ED Provider Triage Note (Signed)
 Emergency Medicine Provider Triage Evaluation Note  [name] [name] , a 78 y.o. male  was evaluated in triage.  Pt complains of chest pain sudden onset around 1:30 PM with associated nausea, lightheadedness, headache neck pain left arm abdominal pain and pain radiating down the left leg.  This was severe and sudden.  History of CAD with CABG.  Has not had pain like this in some time..  Review of Systems  Positive: Chest pain Negative: Neurologic deficit  Physical Exam  BP 137/71 (BP Location: Left Arm)   Pulse 79   Temp 98.2 F (36.8 C)   Resp 16   SpO2 99%  Gen:   Awake, no distress   Resp:  Normal effort  MSK:   Moves extremities without difficulty  Other:    Medical Decision Making  Medically screening exam initiated at 3:47 PM.  Appropriate orders placed.  Nicole Kindred Stegner was informed that the remainder of the evaluation will be completed by another provider, this initial triage assessment does not replace that evaluation, and the importance of remaining in the ED until their evaluation is complete.     Arthor Captain, PA-C 02/07/24 1549

## 2024-02-07 NOTE — ED Triage Notes (Signed)
 Pt is coming in with chest pain that is located mid sternal and radiates to the right and left, this started in the last few hours. He also mentions also some radiation to the left arm. Has a cardiac Hx but endorses it has been a long time since he has experience pain like this, also took x2 nitroglycerin prior to arrival with minimal to no chest pain relief

## 2024-02-08 ENCOUNTER — Telehealth: Payer: Self-pay | Admitting: Cardiology

## 2024-02-08 ENCOUNTER — Observation Stay (HOSPITAL_BASED_OUTPATIENT_CLINIC_OR_DEPARTMENT_OTHER)

## 2024-02-08 ENCOUNTER — Other Ambulatory Visit (HOSPITAL_COMMUNITY)

## 2024-02-08 DIAGNOSIS — R079 Chest pain, unspecified: Secondary | ICD-10-CM | POA: Diagnosis not present

## 2024-02-08 DIAGNOSIS — N183 Chronic kidney disease, stage 3 unspecified: Secondary | ICD-10-CM

## 2024-02-08 DIAGNOSIS — E119 Type 2 diabetes mellitus without complications: Secondary | ICD-10-CM

## 2024-02-08 DIAGNOSIS — G2581 Restless legs syndrome: Secondary | ICD-10-CM | POA: Diagnosis not present

## 2024-02-08 DIAGNOSIS — R072 Precordial pain: Secondary | ICD-10-CM | POA: Diagnosis not present

## 2024-02-08 DIAGNOSIS — E039 Hypothyroidism, unspecified: Secondary | ICD-10-CM | POA: Diagnosis not present

## 2024-02-08 DIAGNOSIS — I2510 Atherosclerotic heart disease of native coronary artery without angina pectoris: Secondary | ICD-10-CM | POA: Diagnosis not present

## 2024-02-08 DIAGNOSIS — I200 Unstable angina: Secondary | ICD-10-CM

## 2024-02-08 DIAGNOSIS — E1165 Type 2 diabetes mellitus with hyperglycemia: Secondary | ICD-10-CM | POA: Diagnosis not present

## 2024-02-08 DIAGNOSIS — I25810 Atherosclerosis of coronary artery bypass graft(s) without angina pectoris: Secondary | ICD-10-CM

## 2024-02-08 DIAGNOSIS — Z794 Long term (current) use of insulin: Secondary | ICD-10-CM

## 2024-02-08 DIAGNOSIS — R413 Other amnesia: Secondary | ICD-10-CM | POA: Diagnosis not present

## 2024-02-08 DIAGNOSIS — R748 Abnormal levels of other serum enzymes: Secondary | ICD-10-CM | POA: Diagnosis present

## 2024-02-08 LAB — TSH: TSH: 6.103 u[IU]/mL — ABNORMAL HIGH (ref 0.350–4.500)

## 2024-02-08 LAB — LIPID PANEL
Cholesterol: 107 mg/dL (ref 0–200)
HDL: 42 mg/dL (ref >40)
LDL Cholesterol: 35 mg/dL (ref 0–99)
Total CHOL/HDL Ratio: 2.5 ratio
Triglycerides: 149 mg/dL (ref <150)
VLDL: 30 mg/dL (ref 0–40)

## 2024-02-08 LAB — HEMOGLOBIN A1C
Hgb A1c MFr Bld: 8 % — ABNORMAL HIGH (ref 4.8–5.6)
Mean Plasma Glucose: 182.9 mg/dL

## 2024-02-08 LAB — ECHOCARDIOGRAM COMPLETE
Area-P 1/2: 2.92 cm2
Calc EF: 49.1 %
S' Lateral: 3.1 cm
Single Plane A2C EF: 51.9 %
Single Plane A4C EF: 48 %

## 2024-02-08 LAB — CBG MONITORING, ED: Glucose-Capillary: 121 mg/dL — ABNORMAL HIGH (ref 70–99)

## 2024-02-08 MED ORDER — ASPIRIN 81 MG PO TBEC: 81.0000 mg | DELAYED_RELEASE_TABLET | Freq: Every day | ORAL | Status: DC

## 2024-02-08 MED ORDER — AMIODARONE HCL 200 MG PO TABS
200.0000 mg | ORAL_TABLET | Freq: Every day | ORAL | Status: DC
  Administered 2024-02-08: 200 mg via ORAL
  Filled 2024-02-08: qty 1

## 2024-02-08 MED ORDER — ACETAMINOPHEN 650 MG RE SUPP: 650.0000 mg | Freq: Four times a day (QID) | RECTAL | Status: DC | PRN

## 2024-02-08 MED ORDER — ACETAMINOPHEN 325 MG PO TABS: 650.0000 mg | ORAL_TABLET | Freq: Four times a day (QID) | ORAL | Status: DC | PRN

## 2024-02-08 MED ORDER — AMITRIPTYLINE HCL 10 MG PO TABS: 10.0000 mg | ORAL_TABLET | Freq: Every evening | ORAL | Status: DC | PRN

## 2024-02-08 MED ORDER — TRIMETHOBENZAMIDE HCL 100 MG/ML IM SOLN: 200.0000 mg | Freq: Four times a day (QID) | INTRAMUSCULAR | Status: DC | PRN

## 2024-02-08 MED ORDER — ALBUTEROL SULFATE (2.5 MG/3ML) 0.083% IN NEBU: 2.5000 mg | INHALATION_SOLUTION | Freq: Four times a day (QID) | RESPIRATORY_TRACT | Status: DC | PRN

## 2024-02-08 MED ORDER — NITROGLYCERIN 0.4 MG SL SUBL: 0.4000 mg | SUBLINGUAL_TABLET | SUBLINGUAL | Status: DC | PRN

## 2024-02-08 MED ORDER — ASPIRIN 325 MG PO TABS
325.0000 mg | ORAL_TABLET | Freq: Once | ORAL | Status: AC
  Administered 2024-02-08: 325 mg via ORAL
  Filled 2024-02-08: qty 1

## 2024-02-08 MED ORDER — SODIUM CHLORIDE 0.9% FLUSH: 3.0000 mL | Freq: Two times a day (BID) | INTRAVENOUS | Status: DC

## 2024-02-08 MED ORDER — QUETIAPINE FUMARATE 25 MG PO TABS: 25.0000 mg | ORAL_TABLET | Freq: Every day | ORAL | Status: DC

## 2024-02-08 MED ORDER — HYDRALAZINE HCL 20 MG/ML IJ SOLN: 10.0000 mg | INTRAMUSCULAR | Status: DC | PRN

## 2024-02-08 MED ORDER — MEMANTINE HCL 10 MG PO TABS
10.0000 mg | ORAL_TABLET | Freq: Two times a day (BID) | ORAL | Status: DC
  Administered 2024-02-08: 10 mg via ORAL
  Filled 2024-02-08: qty 1

## 2024-02-08 MED ORDER — INSULIN GLARGINE 100 UNIT/ML ~~LOC~~ SOLN
30.0000 [IU] | Freq: Every day | SUBCUTANEOUS | Status: DC
  Filled 2024-02-08: qty 0.3

## 2024-02-08 MED ORDER — ISOSORBIDE MONONITRATE ER 30 MG PO TB24: 120.0000 mg | ORAL_TABLET | Freq: Every day | ORAL | Status: DC

## 2024-02-08 MED ORDER — VENLAFAXINE HCL ER 75 MG PO CP24: 75.0000 mg | ORAL_CAPSULE | Freq: Every day | ORAL | Status: DC

## 2024-02-08 MED ORDER — SERTRALINE HCL 100 MG PO TABS
100.0000 mg | ORAL_TABLET | Freq: Every day | ORAL | Status: DC
  Administered 2024-02-08: 100 mg via ORAL
  Filled 2024-02-08: qty 1

## 2024-02-08 MED ORDER — APIXABAN 5 MG PO TABS
5.0000 mg | ORAL_TABLET | Freq: Two times a day (BID) | ORAL | Status: DC
  Administered 2024-02-08: 5 mg via ORAL
  Filled 2024-02-08: qty 1

## 2024-02-08 MED ORDER — INSULIN ASPART 100 UNIT/ML IJ SOLN: 0.0000 [IU] | Freq: Three times a day (TID) | INTRAMUSCULAR | Status: DC

## 2024-02-08 MED ORDER — CETIRIZINE HCL 10 MG PO TABS
5.0000 mg | ORAL_TABLET | Freq: Every evening | ORAL | Status: DC
  Filled 2024-02-08: qty 1

## 2024-02-08 MED ORDER — PRAMIPEXOLE DIHYDROCHLORIDE 0.25 MG PO TABS
0.5000 mg | ORAL_TABLET | Freq: Every day | ORAL | Status: DC
  Filled 2024-02-08: qty 4

## 2024-02-08 MED ORDER — LEVOTHYROXINE SODIUM 25 MCG PO TABS
125.0000 ug | ORAL_TABLET | Freq: Every day | ORAL | Status: DC
  Administered 2024-02-08: 125 ug via ORAL
  Filled 2024-02-08: qty 1

## 2024-02-08 MED ORDER — HYDROXYZINE HCL 10 MG PO TABS: 30.0000 mg | ORAL_TABLET | Freq: Every day | ORAL | Status: DC

## 2024-02-08 MED ORDER — ATORVASTATIN CALCIUM 80 MG PO TABS: 80.0000 mg | ORAL_TABLET | Freq: Every day | ORAL | Status: DC

## 2024-02-08 NOTE — ED Provider Notes (Signed)
 [place] [facility] AT [facility] Provider Note   CSN: [id number] Arrival date & time: 02/07/24  1523     History  Chief Complaint  Patient presents with   Chest Pain    [name] [name] is a 78 y.o. male.  78 y/o male with hx of HLD, CAD s/p CABG, HTN, PAF (on Eliquis), DM, CKD, and dementia presents to the ED for evaluation of chest pain. Reports onset of pain earlier this afternoon in his central chest radiating to his LUE. In triage also reported radiating to his LLE, though states this has resolved. Chest pain would be aggravated by doing things "around the house", but improved with NTG x 2. He notes associated nausea, lightheadedness. No diaphoresis, syncope, fevers, abdominal pain, vomiting, leg swelling.   The history is provided by the patient. No language interpreter was used.  Chest Pain      Home Medications Prior to Admission medications   Medication Sig Start Date End Date Taking? Authorizing Provider  amiodarone (PACERONE) 200 MG tablet Take 1 tablet (200 mg total) by mouth daily. 12/01/23  Yes Conte, Tessa N, PA-C  amitriptyline (ELAVIL) 10 MG tablet Take 1-3 tablets (10-30 mg total) by mouth at bedtime. 12/22/23  Yes Bradd Canary, MD  apixaban (ELIQUIS) 5 MG TABS tablet Take 1 tablet (5 mg total) by mouth 2 (two) times daily. 01/18/24  Yes Sharlene Dory, PA-C  aspirin EC 81 MG tablet Take 81 mg by mouth daily. Swallow whole.   Yes [provider]  atorvastatin (LIPITOR) 80 MG tablet Take 1 tablet (80 mg total) by mouth daily. 12/22/23  Yes Bradd Canary, MD  dapagliflozin propanediol (FARXIGA) 10 MG TABS tablet Take 1 tablet (10 mg total) by mouth daily. 05/13/23  Yes Shamleffer, Konrad Dolores, MD  insulin glargine, 2 Unit Dial, (TOUJEO MAX SOLOSTAR) 300 UNIT/ML Solostar Pen Inject 60 Units into the skin daily in the afternoon. 03/03/23  Yes Shamleffer, Konrad Dolores, MD  insulin lispro (HUMALOG KWIKPEN) 100 UNIT/ML KwikPen  Inject 10 Units into the skin 3 (three) times daily. Patient taking differently: Inject 12 Units into the skin 3 (three) times daily. Plus correction blood glucose- 130 / 25 03/03/23  Yes Shamleffer, Konrad Dolores, MD  isosorbide mononitrate (IMDUR) 120 MG 24 hr tablet Take 1 tablet (120 mg total) by mouth daily. 12/22/23  Yes Bradd Canary, MD  levocetirizine (XYZAL) 5 MG tablet Take 1 tablet (5 mg total) by mouth every evening. 01/18/24  Yes Bradd Canary, MD  levothyroxine (SYNTHROID) 125 MCG tablet Take 1 tablet (125 mcg total) by mouth daily. 09/05/23  Yes Bradd Canary, MD  memantine (NAMENDA) 10 MG tablet Take 1 tablet (10 mg total) by mouth 2 (two) times daily. 02/01/24 01/26/25 Yes Bradd Canary, MD  nitroGLYCERIN (NITROSTAT) 0.4 MG SL tablet PLACE 1 TABLET UNDER THE TONGUE EVERY 5 MINUTES AS NEEDED FOR CHEST PAIN. 12/22/23  Yes Bradd Canary, MD  pramipexole (MIRAPEX) 1 MG tablet Take 1/2 to 1 tablet (0.5-1 mg total) by mouth at bedtime. 12/22/23  Yes Bradd Canary, MD  QUEtiapine (SEROQUEL) 25 MG tablet Take 1 tablet (25 mg total) by mouth at bedtime. 10/03/23 04/30/24 Yes Windell Norfolk, MD  ranolazine (RANEXA) 500 MG 12 hr tablet Take 1 tablet (500 mg total) by mouth 2 (two) times daily. 09/19/23  Yes Bradd Canary, MD  sertraline (ZOLOFT) 100 MG tablet Take 1 tablet (100 mg total) by mouth daily. 12/06/23  Yes Bradd Canary, MD  venlafaxine XR (EFFEXOR XR) 75 MG 24 hr capsule Take 1 capsule (75 mg total) by mouth daily with breakfast. 12/22/23  Yes Bradd Canary, MD  Continuous Glucose Sensor (FREESTYLE LIBRE 3 SENSOR) MISC Place 1 sensor on the skin every 14 days. Use to check glucose continuously 05/13/23   Shamleffer, Konrad Dolores, MD  hydrOXYzine (ATARAX) 10 MG tablet Take 1 tablet (10 mg total) by mouth 3 (three) times daily as needed for anxiety or itching. Patient taking differently: Take 30 mg by mouth at bedtime. 12/22/23   Bradd Canary, MD  insulin lispro (HUMALOG  KWIKPEN) 100 UNIT/ML KwikPen Inject 10 Units into the skin 3 (three) times daily. Patient not taking: Reported on 02/08/2024 01/24/24   Bradd Canary, MD      Allergies    Morphine and Shellfish-derived products    Review of Systems   Review of Systems  Cardiovascular:  Positive for chest pain.  Ten systems reviewed and are negative for acute change, except as noted in the HPI.    Physical Exam Updated Vital Signs BP 137/68   Pulse 74   Temp 98.6 F (37 C) (Oral)   Resp 14   SpO2 99%   Physical Exam Vitals and nursing note reviewed.  Constitutional:      General: He is not in acute distress.    Appearance: He is well-developed. He is not diaphoretic.     Comments: Nontoxic appearing, pleasant.  HENT:     Head: Normocephalic and atraumatic.  Eyes:     General: No scleral icterus.    Conjunctiva/sclera: Conjunctivae normal.  Cardiovascular:     Rate and Rhythm: Normal rate and regular rhythm.     Pulses: Normal pulses.  Pulmonary:     Effort: Pulmonary effort is normal. No respiratory distress.     Breath sounds: No stridor. No wheezing.     Comments: Respirations even and unlabored Abdominal:     General: There is no distension.  Musculoskeletal:        General: Normal range of motion.     Cervical back: Normal range of motion.  Skin:    General: Skin is warm and dry.     Coloration: Skin is not pale.     Findings: No erythema or rash.  Neurological:     Mental Status: He is alert and oriented to person, place, and time.     Coordination: Coordination normal.  Psychiatric:        Behavior: Behavior normal.     ED Results / Procedures / Treatments   Labs (all labs ordered are listed, but only abnormal results are displayed) Labs Reviewed  CBC - Abnormal; Notable for the following components:      Result Value   WBC 10.6 (*)    All other components within normal limits  COMPREHENSIVE METABOLIC PANEL - Abnormal; Notable for the following components:    Glucose, Bld 178 (*)    BUN 29 (*)    Creatinine, Ser 1.40 (*)    GFR, Estimated 52 (*)    All other components within normal limits  LIPASE, BLOOD - Abnormal; Notable for the following components:   Lipase 60 (*)    All other components within normal limits  TROPONIN I (HIGH SENSITIVITY)  TROPONIN I (HIGH SENSITIVITY)    EKG EKG Interpretation Date/Time:  Tuesday February 07 2024 15:31:17 EST Ventricular Rate:  82 PR Interval:  184 QRS Duration:  150 QT Interval:  432 QTC Calculation: 504 R Axis:   129  Text Interpretation: Normal sinus rhythm Right bundle branch block , plus right ventricular hypertrophy Left posterior fascicular block Bifascicular block Abnormal ECG When compared with ECG of 14-Oct-2023 19:54, PREVIOUS ECG IS PRESENT Confirmed by Kennis Carina [phone]) on 02/08/2024 2:09:14 AM  Radiology CT Angio Chest/Abd/Pel for Dissection W and/or Wo Contrast Result Date: 02/07/2024 CLINICAL DATA:  Chest, arm, and leg pain, sudden onset EXAM: CT ANGIOGRAPHY CHEST, ABDOMEN AND PELVIS TECHNIQUE: Non-contrast CT of the chest was initially obtained. Multidetector CT imaging through the chest, abdomen and pelvis was performed using the standard protocol during bolus administration of intravenous contrast. Multiplanar reconstructed images and MIPs were obtained and reviewed to evaluate the vascular anatomy. RADIATION DOSE REDUCTION: This exam was performed according to the departmental dose-optimization program which includes automated exposure control, adjustment of the mA and/or kV according to patient size and/or use of iterative reconstruction technique. CONTRAST:  75mL OMNIPAQUE IOHEXOL 350 MG/ML SOLN COMPARISON:  10/14/2023 FINDINGS: CTA CHEST FINDINGS VASCULAR Aorta: Satisfactory opacification of the aorta. Normal contour and caliber of the thoracic aorta. No evidence of aneurysm, dissection, or other acute aortic pathology. Mild aortic atherosclerosis. Cardiovascular: No evidence of  pulmonary embolism on limited non-tailored examination. Cardiomegaly. Three-vessel coronary artery calcifications status post median sternotomy and CABG. No pericardial effusion. Review of the MIP images confirms the above findings. NON VASCULAR Mediastinum/Nodes: No enlarged mediastinal, hilar, or axillary lymph nodes. Thyroid gland, trachea, and esophagus demonstrate no significant findings. Lungs/Pleura: Minimal bibasilar scarring or atelectasis. No pleural effusion or pneumothorax. Musculoskeletal: No chest wall abnormality. No acute osseous findings. Subacute appearing fractures of the lateral left ribs (series 8, image 68). Review of the MIP images confirms the above findings. CTA ABDOMEN AND PELVIS FINDINGS VASCULAR Normal contour and caliber of the abdominal aorta. No evidence of aneurysm, dissection, or other acute aortic pathology. Standard branching pattern of the abdominal aorta with solitary bilateral renal arteries. Moderate aortic atherosclerosis. Review of the MIP images confirms the above findings. NON-VASCULAR Hepatobiliary: No solid liver abnormality is seen. No gallstones, gallbladder wall thickening, or biliary dilatation. Pancreas: Unremarkable. No pancreatic ductal dilatation or surrounding inflammatory changes. Spleen: Normal in size without significant abnormality. Adrenals/Urinary Tract: Adrenal glands are unremarkable. Punctuate nonobstructive calculus of the inferior pole of the right kidney. Bladder is unremarkable. Stomach/Bowel: Stomach is within normal limits. Appendix appears normal. No evidence of bowel wall thickening, distention, or inflammatory changes. Lymphatic: No enlarged abdominal or pelvic lymph nodes. Reproductive: Prostatomegaly. Other: No abdominal wall hernia or abnormality. No ascites. Musculoskeletal: No acute osseous findings. IMPRESSION: 1. Normal contour and caliber of the thoracic and abdominal aorta. No evidence of aneurysm, dissection, or other acute aortic  pathology. Aortic atherosclerosis. 2. Cardiomegaly and coronary artery disease. 3. Punctuate nonobstructive calculus of the inferior pole of the right kidney. 4. Prostatomegaly. Aortic Atherosclerosis (ICD10-I70.0). Electronically Signed   By: Jearld Lesch [name]   On: 02/07/2024 19:09   DG Chest 2 View Result Date: 02/07/2024 CLINICAL DATA:  Chest pain EXAM: CHEST - 2 VIEW COMPARISON:  10/17/2023 FINDINGS: Mild hyperinflation. Median sternotomy for CABG. Apical lordotic frontal radiograph. Midline trachea. Borderline cardiomegaly. No pleural effusion or pneumothorax. Clear lungs. IMPRESSION: No acute cardiopulmonary disease. Electronically Signed   By: Jeronimo Greaves [name]   On: 02/07/2024 18:26    Procedures Procedures    Medications Ordered in ED Medications  ondansetron (ZOFRAN-ODT) disintegrating tablet 4 mg (has no administration in time range)  iohexol (OMNIPAQUE) 350 MG/ML injection  75 mL (75 mLs Intravenous Contrast Given 02/07/24 1820)    ED Course/ Medical Decision Making/ A&P Clinical Course as of 02/08/24 9629  Wed Feb 08, 2024  0203 Spoke with MD Brayton Layman of Cardiology. He recommends inpatient observation with stress test in AM, cycle troponin levels. If low risk stress test, would be reasonable for patient to continue f/u outpatient. Cardiology will plan to see later this AM. [KH]    Clinical Course User Index [KH] Antony Madura, PA-C                                 Medical Decision Making Amount and/or Complexity of Data Reviewed Labs: ordered. Radiology: ordered.  Risk Decision regarding hospitalization.   This patient presents to the ED for concern of chest pain, this involves an extensive number of treatment options, and is a complaint that carries with it a high risk of complications and morbidity.  The differential diagnosis includes ACS vs PTX vs pleural effusion vs dissection vs MSK vs PUD/gastritis.   Co morbidities that complicate the patient  evaluation  HLD HTN PAF CAD s/p CABG CKD Dementia    Additional history obtained:  External records from outside source obtained and reviewed including echocardiogram and Lexiscan in March 2023 which were reassuring. Heart catheterization in 2021 stable compared to prior with patent bypass grafts.   Lab Tests:  I Ordered, and personally interpreted labs.  The pertinent results include:  WBC 10.6, Creatinine 1.4 (stable), Lipase 60, Glucose 178.   Imaging Studies ordered:  I ordered imaging studies including CTA dissection study, CXR  I independently visualized and interpreted imaging which showed no acute cardiopulmonary abnormality. No aortic dissection. I agree with the radiologist interpretation   Cardiac Monitoring:  The patient was maintained on a cardiac monitor.  I personally viewed and interpreted the cardiac monitored which showed an underlying rhythm of: NSR   Medicines ordered and prescription drug management:  I have reviewed the patients home medicines and have made adjustments as needed   Test Considered:  Bedside echocardiogram   Consultations Obtained:  I requested consultation with Cardiology, Dr. Lily Peer, and discussed lab and imaging findings as well as pertinent plan - they recommend: inpatient observation and AM stress test.   Problem List / ED Course:  As above Work up reassuring; however, high risk cardiac history. Heart score 7. Reports aggravation of pain with exertion, associated nausea, lightheadedness, improvement with NTG. Cardiology recommendations for inpatient observation, stress test in AM. Hospitalist to admit.   Reevaluation:  After the interventions noted above, I reevaluated the patient and found that they have :stayed the same   Social Determinants of Health:  Dementia Lives independently   Dispostion:  After consideration of the diagnostic results and the patients response to treatment, I feel that the patent  would benefit from inpatient observation for chest pain rule out. Hospitalist to admit.          Final Clinical Impression(s) / ED Diagnoses Final diagnoses:  Chest pain, unspecified type    Rx / DC Orders ED Discharge Orders     None         Antony Madura, PA-C 02/08/24 0322    Sabas Sous, MD 02/08/24 0730

## 2024-02-08 NOTE — Telephone Encounter (Signed)
 Spoke with pt's daughter (per DPR). She is aware that a Lexiscan stress test has been ordered and that someone will be reaching out to schedule the test.

## 2024-02-08 NOTE — Progress Notes (Signed)
   Patient Name: [name] [name] Date of Encounter: 02/08/2024 [facility] Cardiologist: None   Interval Summary  .    Brought into the ER with chest pain occurred while lasting with 2 nitroglycerin with some improvement. Feels well now. With family.   Vital Signs .    Vitals:   02/08/24 0700 02/08/24 0715 02/08/24 0730 02/08/24 0745  BP: (!) 148/65 (!) 148/62 (!) 142/62 131/67  Pulse: 64 64 66 62  Resp: (!) 21 (!) 22 (!) 22 15  Temp:      TempSrc:      SpO2: 98% 98% 97% 99%   No intake or output data in the 24 hours ending 02/08/24 1158    02/02/2024   11:06 AM 10/28/2023   10:23 AM 10/14/2023    7:41 PM  Last 3 Weights  Weight (lbs) 170 lb 167 lb 6 oz 167 lb 1.7 oz  Weight (kg) 77.111 kg 75.921 kg 75.8 kg      Telemetry/ECG    No adverse arrhythmias- Personally Reviewed  Physical Exam .   GEN: No acute distress.   Neck: No JVD Cardiac: CABG scar RRR, no murmurs, rubs, or gallops.  Respiratory: Clear to auscultation bilaterally. GI: Soft, nontender, non-distended  MS: No edema  Assessment & Plan .     78 year old with known coronary disease status post CABG with atypical chest pain  Chest pain - Plan by consulting cardiologist was to perform nuclear stress test.  I think this is reasonable however we can do this in the outpatient setting for further restratification.  His troponins are all normal.  He does have underlying memory issues.  We will go ahead and get him set up for outpatient nuclear stress test and I am comfortable with him being discharged home.  Recent echo with EF of 65% in 2023 left heart catheterization in 2021 showed no change from 2019-continue with medical management.  OK with DC. Will set up NUC stress as outpatient.   For questions or updates, please contact [facility] Please consult [email or web address] for contact info under        Signed, Donato Schultz, MD

## 2024-02-08 NOTE — ED Notes (Signed)
 Checked patient cbg it was 20 notified RN of blood sugar

## 2024-02-08 NOTE — Telephone Encounter (Signed)
 Patient currently being seen in the ER for chest pain and negative troponins.  Plan for outpatient Lexiscan.  Please call patient and help him organize this.

## 2024-02-08 NOTE — Addendum Note (Signed)
 Addended by: Erick Alley on: 02/08/2024 01:10 PM   Modules accepted: Orders

## 2024-02-08 NOTE — ED Notes (Signed)
 Pt ambulated to bathroom with no assistance.

## 2024-02-08 NOTE — H&P (Signed)
 History and Physical    Patient: [name] [name] JWJ:[id number] DOB: 19-Mar-1946 DOA: 02/07/2024 DOS: the patient was seen and examined on 02/08/2024 PCP: Bradd Canary, MD  Patient coming from: Home  Chief Complaint:  Chief Complaint  Patient presents with   Chest Pain   HPI: [name] [name] is a 78 y.o. male with medical history significant of hypertension, hyperlipidemia, paroxysmal atrial fibrillation, PSVT, CAD s/p CABG, history of heart failure, diabetes mellitus type 2, hypothyroidism, seizure disorder, iron deficiency anemia, gastric ulcer, OSA,, and chronic back pain presents with left-sided chest pain radiating to the arm. He is accompanied by his youngest daughter.  He experiences severe left-sided chest pain radiating into his arm, described as 'like lightning.' The pain was severe enough to prompt him to seek medical attention last night. He attempted to alleviate the pain with two nitroglycerin tablets, which provided some relief. During the episode of chest pain, he also experienced shortness of breath and sweating. No nausea or vomiting.  He reports a recent cough that has been present for about three days, which he initially thought might be related to the flu. No recent contact with individuals known to have the flu.  His feet remain swollen, and he is currently under the care of a neurologist for this issue.  He has no history of smoking and reports minimal alcohol use in the past.  On admission to the emergency department patient was noted to be afebrile with blood pressures elevated up to 149/64, and O2 saturation maintained on room air.  Labs significant for WBC 10.6, BUN 29, creatinine 1.4, lipase 60, and high-sensitivity troponins negative x 2.  Chest x-ray showed no acute abnormality.  CT angiogram of the chest abdomen and pelvis have been obtained which noted no acute abnormality.  Chronic findings included cardiomegaly, coronary artery disease, nonobstructive  calculus of the inferior pole of the right kidney, and prostamegaly.  Cardiology had been formally consulted and plan on possible stress test in a.m.  Review of Systems: As mentioned in the history of present illness. All other systems reviewed and are negative. Past Medical History:  Diagnosis Date   Acquired atrophy of thyroid 06/05/2013   Overview:  July 2014: controlled on synthroid since 2012 May 2015: decreased to Synthroid 50 mcg  Last Assessment & Plan:  Pt doing well with most recent TSH within normal range.  Will continue current dosage of Synthroid . Pt reminded to take medication on empty stomach. Will continue to monitor with periodic laboratory assessment in 3 months.    Anemia    hemoglobin 7.4, iron deficiency, January, 2011, 2 unit transfusion, endoscopy normal, capsule endoscopy February, 2011 no small bowel abnormalities.   Most likely source gastric erosions, followed by GI   Anxiety    Bradycardia    CAD (coronary artery disease)    A. CABG in 2000,status post cardiac cath in 2006, 2009 ....continued chest pain and SOB despite oral medication adjestments including Ranexa. B. Cath November 2009/ mRCA - 2.75 x 23 Abbott Xience V drug-eluting stent ...11/26/2008 to distal  RCA leading to acute marginal.  C. Cath 07/2012 for CP - stable anatomy, med rx. d. cath 2015 and 05/30/2015 stable anatomy, consider Myoview if has CP again   Carotid artery disease (HCC) 08/01/2015   Doppler, May 29, 2015, 1-39% bilateral ICA    Cerebral ischemia    MRI November, 2010, chronic microvascular ischemia   CKD (chronic kidney disease), stage III (HCC)    Concussion  Depression    Bipolar   Diabetes mellitus (HCC)    Edema    Essential hypertension 06/05/2013   Overview:  July 2014: Controlled with Bystolic Sep 2015: Changed to atenolol.  Last Assessment & Plan:  Will change to atenolol for cost and follow Improved.  Medication compliance strongly encouraged BP: 116/64 mmHg     Overview:  July 2014: Controlled with Bystolic Sep 2015: Changed to atenolol.  Last Assessment & Plan:  Will change to atenolol for cost and follow   Falling episodes    these have occurred in the past and again recurring 2011   Family history of adverse reaction to anesthesia    "mother died during bypass surgery but not sure if it has to do with anesthesia"   Gastric ulcer    GERD (gastroesophageal reflux disease)    H/O medication noncompliance    Due to loss of insurance   H/O multiple concussions    Hard of hearing    Heart murmur    History of blood transfusion 12/20/2013   History of kidney stones    Hx of CABG    2000,  / one median sternotomy suture broken her chest x-ray November, 2010, no clinical significance   Hyperlipidemia    Hypertension    pt. denies   Iron deficiency anemia    Long term (current) use of anticoagulants [Z79.01] 07/20/2016   Low back pain 06/12/2009   Qualifier: Diagnosis of  By: Nena Jordan    Nephrolithiasis    Orthostasis    OSA (obstructive sleep apnea)    PAF (paroxysmal atrial fibrillation) (HCC)    a. dx 2017, started on amiodarone/warfarin but patient intermittently noncompliant.   PSVT (paroxysmal supraventricular tachycardia) (HCC)    RBBB 07/09/2009   Qualifier: Diagnosis of  By: Myrtis Ser, MD, [facility], Lemmie Evens    RLS (restless legs syndrome) 09/19/2009   Qualifier: Diagnosis of  By: Nena Jordan   Overview:  July 2014: Controlled with Mirapex  Last Assessment & Plan:  Patient is doing well. Will continue current management and follow clinically.   Seizure disorder (HCC) 01/09/2017   Spondylosis    C5-6, C6-7 MRI 2010   Syncope 03/2016   TBI (traumatic brain injury) (HCC) 2019   Thyroid disease    Tubulovillous adenoma of colon 2007   Vitamin D deficiency 05/17/2017   Wears glasses    Past Surgical History:  Procedure Laterality Date   ANTERIOR CERVICAL DECOMP/DISCECTOMY FUSION N/A 05/31/2016   Procedure: ANTERIOR  CERVICAL DECOMPRESSION/DISCECTOMY FUSION CERVICAL FIVE-SIX,CERVICAL SIX-SEVEN;  Surgeon: Julio Sicks, MD;  Location: [facility] NEURO ORS;  Service: Neurosurgery;  Laterality: N/A;   CARDIAC CATHETERIZATION N/A 05/30/2015   Procedure: Left Heart Cath and Coronary Angiography;  Surgeon: Marykay Lex, MD;  Location: [facility] INVASIVE CV LAB;  Service: Cardiovascular;  Laterality: N/A;   CATARACT EXTRACTION     COLONOSCOPY     CORONARY ARTERY BYPASS GRAFT     2000   ESOPHAGOGASTRODUODENOSCOPY     LEFT HEART CATH AND CORS/GRAFTS ANGIOGRAPHY N/A 12/15/2017   Procedure: LEFT HEART CATH AND CORS/GRAFTS ANGIOGRAPHY;  Surgeon: Corky Crafts, MD;  Location: [facility] INVASIVE CV LAB;  Service: Cardiovascular;  Laterality: N/A;   LEFT HEART CATHETERIZATION WITH CORONARY/GRAFT ANGIOGRAM N/A 08/01/2012   Procedure: LEFT HEART CATHETERIZATION WITH Isabel Caprice;  Surgeon: Herby Abraham, MD;  Location: [facility] CATH LAB;  Service: Cardiovascular;  Laterality: N/A;   LEFT HEART CATHETERIZATION WITH CORONARY/GRAFT ANGIOGRAM N/A 01/03/2015  Procedure: LEFT HEART CATHETERIZATION WITH Isabel Caprice;  Surgeon: Runell Gess, MD;  Location: [facility] CATH LAB;  Service: Cardiovascular;  Laterality: N/A;   NASAL SEPTUM SURGERY     UP3   PERCUTANEOUS CORONARY STENT INTERVENTION (PCI-S)  10/06/2008   mRCA PCI  2.75 x 23 Abbott Xience V drug-eluting stent    RIGHT/LEFT HEART CATH AND CORONARY/GRAFT ANGIOGRAPHY N/A 01/07/2020   Procedure: RIGHT/LEFT HEART CATH AND CORONARY/GRAFT ANGIOGRAPHY;  Surgeon: Lyn Records, MD;  Location: [facility] INVASIVE CV LAB;  Service: Cardiovascular;  Laterality: N/A;   TOTAL KNEE ARTHROPLASTY Right 09/22/2021   Procedure: RIGHT TOTAL KNEE ARTHROPLASTY;  Surgeon: Marcene Corning, MD;  Location: [facility] ORS;  Service: Orthopedics;  Laterality: Right;   ULTRASOUND GUIDANCE FOR VASCULAR ACCESS  12/15/2017   Procedure: Ultrasound Guidance For Vascular Access;  Surgeon: Corky Crafts, MD;   Location: [facility] INVASIVE CV LAB;  Service: Cardiovascular;;   Social History:  reports that he has never smoked. He has never been exposed to tobacco smoke. He has never used smokeless tobacco. He reports that he does not drink alcohol and does not use drugs.  Allergies  Allergen Reactions   Morphine Other (See Comments)    Hallucinations   Shellfish-Derived Products Itching    Family History  Problem Relation Age of Onset   Pancreatic cancer Brother    Diabetes Brother    Coronary artery disease Brother    Stroke Brother    Diabetes Brother    Diabetes Mother    Heart failure Mother    Heart failure Father    Hypothyroidism Brother    Coronary artery disease Brother    Other Brother        colon surgery   Heart attack Other        Nephew   Irregular heart beat Daughter    Cancer Maternal Grandmother        unknown    Colon cancer Neg Hx    Stomach cancer Neg Hx    Liver cancer Neg Hx    Rectal cancer Neg Hx    Esophageal cancer Neg Hx     Prior to Admission medications   Medication Sig Start Date End Date Taking? Authorizing Provider  amiodarone (PACERONE) 200 MG tablet Take 1 tablet (200 mg total) by mouth daily. 12/01/23  Yes Conte, Tessa N, PA-C  amitriptyline (ELAVIL) 10 MG tablet Take 1-3 tablets (10-30 mg total) by mouth at bedtime. 12/22/23  Yes Bradd Canary, MD  apixaban (ELIQUIS) 5 MG TABS tablet Take 1 tablet (5 mg total) by mouth 2 (two) times daily. 01/18/24  Yes Sharlene Dory, PA-C  aspirin EC 81 MG tablet Take 81 mg by mouth daily. Swallow whole.   Yes [provider]  atorvastatin (LIPITOR) 80 MG tablet Take 1 tablet (80 mg total) by mouth daily. 12/22/23  Yes Bradd Canary, MD  dapagliflozin propanediol (FARXIGA) 10 MG TABS tablet Take 1 tablet (10 mg total) by mouth daily. 05/13/23  Yes Shamleffer, Konrad Dolores, MD  insulin glargine, 2 Unit Dial, (TOUJEO MAX SOLOSTAR) 300 UNIT/ML Solostar Pen Inject 60 Units into the skin daily in the  afternoon. 03/03/23  Yes Shamleffer, Konrad Dolores, MD  insulin lispro (HUMALOG KWIKPEN) 100 UNIT/ML KwikPen Inject 10 Units into the skin 3 (three) times daily. Patient taking differently: Inject 12 Units into the skin 3 (three) times daily. Plus correction blood glucose- 130 / 25 03/03/23  Yes Shamleffer, Konrad Dolores, MD  isosorbide mononitrate (IMDUR) 120  MG 24 hr tablet Take 1 tablet (120 mg total) by mouth daily. 12/22/23  Yes Bradd Canary, MD  levocetirizine (XYZAL) 5 MG tablet Take 1 tablet (5 mg total) by mouth every evening. 01/18/24  Yes Bradd Canary, MD  levothyroxine (SYNTHROID) 125 MCG tablet Take 1 tablet (125 mcg total) by mouth daily. 09/05/23  Yes Bradd Canary, MD  memantine (NAMENDA) 10 MG tablet Take 1 tablet (10 mg total) by mouth 2 (two) times daily. 02/01/24 01/26/25 Yes Bradd Canary, MD  nitroGLYCERIN (NITROSTAT) 0.4 MG SL tablet PLACE 1 TABLET UNDER THE TONGUE EVERY 5 MINUTES AS NEEDED FOR CHEST PAIN. 12/22/23  Yes Bradd Canary, MD  pramipexole (MIRAPEX) 1 MG tablet Take 1/2 to 1 tablet (0.5-1 mg total) by mouth at bedtime. 12/22/23  Yes Bradd Canary, MD  QUEtiapine (SEROQUEL) 25 MG tablet Take 1 tablet (25 mg total) by mouth at bedtime. 10/03/23 04/30/24 Yes Windell Norfolk, MD  ranolazine (RANEXA) 500 MG 12 hr tablet Take 1 tablet (500 mg total) by mouth 2 (two) times daily. 09/19/23  Yes Bradd Canary, MD  sertraline (ZOLOFT) 100 MG tablet Take 1 tablet (100 mg total) by mouth daily. 12/06/23  Yes Bradd Canary, MD  venlafaxine XR (EFFEXOR XR) 75 MG 24 hr capsule Take 1 capsule (75 mg total) by mouth daily with breakfast. 12/22/23  Yes Bradd Canary, MD  Continuous Glucose Sensor (FREESTYLE LIBRE 3 SENSOR) MISC Place 1 sensor on the skin every 14 days. Use to check glucose continuously 05/13/23   Shamleffer, Konrad Dolores, MD  hydrOXYzine (ATARAX) 10 MG tablet Take 1 tablet (10 mg total) by mouth 3 (three) times daily as needed for anxiety or  itching. Patient taking differently: Take 30 mg by mouth at bedtime. 12/22/23   Bradd Canary, MD  insulin lispro (HUMALOG KWIKPEN) 100 UNIT/ML KwikPen Inject 10 Units into the skin 3 (three) times daily. Patient not taking: Reported on 02/08/2024 01/24/24   Bradd Canary, MD    Physical Exam: Vitals:   02/08/24 0415 02/08/24 0500 02/08/24 0600 02/08/24 0700  BP: (!) 147/61 (!) 149/64 (!) 148/63 (!) 148/65  Pulse: 66 68 67 64  Resp: (!) 21 20 20  (!) 21  Temp: 98 F (36.7 C)     TempSrc: Oral     SpO2: 100% 99% 98% 98%   Constitutional: Elderly male currently in no acute distress Eyes: PERRL, lids and conjunctivae normal ENMT: Mucous membranes are moist.  Dentition Neck: normal, supple, no masses, no JVD appreciated. Respiratory: clear to auscultation bilaterally, no wheezing, no crackles. Normal respiratory effort. No accessory muscle use.  Cardiovascular: Regular rate and rhythm, no murmurs / rubs / gallops. No extremity edema. 2+ pedal pulses. No carotid bruits.  Abdomen: no tenderness, no masses palpated. No hepatosplenomegaly. Bowel sounds positive.  Musculoskeletal: no clubbing / cyanosis. No joint deformity upper and lower extremities. Good ROM, no contractures. Normal muscle tone.  Skin: no rashes, lesions, ulcers. No induration Neurologic: CN 2-12 grossly intact.   Strength 5/5 in all 4.  Psychiatric: Normal judgment and insight. Alert and oriented x 3. Normal mood.   Data Reviewed:   EKG revealed normal sinus rhythm 82 bpm, right bundle branch block plus right ventricular hypertrophy, left posterior fascicular block, and QT 514..  Assessment and Plan:  Chest pain  CAD Acute.  Patient presents with complaints of left-sided chest pain.  High-sensitivity troponins negative x 2.  Patient had taken nitroglycerin at home prior  to arrival with some improvement in chest pain symptoms.  Chest x-ray showed no acute abnormality.  EKG noted a bifascicular block.  Patient with prior  history of CABG last heart cath was in 01/2020 which noted total occlusion of the mid LAD, diagonal, native RCA, and ramus intermedius.  Mild to moderate disease in the distal circumflex and all bypass grafts were noted to be widely patent and unchanged from previous in 2019. -Admit to a cardiac telemetry bed -N.p.o. for possible need of stress testing -Check echocardiogram -Check lipid panel -Cardiology consulted, will follow-up for any further recommendations  Elevated lipase Lipase elevated at 60.  CT of the chest abdomen pelvis did not note any findings suggestive of pancreatitis.  Patient denies any significant history of alcohol abuse. -Continue to monitor  Uncontrolled diabetes mellitus type 2, with long-term use of insulin On admission glucose 178.  Last hemoglobin A1c 10.3 when checked 08/2023. -Hypoglycemic protocol -Continue Farxiga -Reduce Lantus dose to 30 units due to patient being n.p.o. for need of procedure -CBGs before every meal with a sliding scale insulin  Chronic kidney disease stage IIIa Creatinine 1.14 with BUN 29. -Recheck BMP in a.m.  Hypothyroidism TSH last noted to be 10.12 when checked 09/01/2023. -Check TSH -Continue levothyroxine  Memory loss  -Continue Namenda  Restless leg syndrome -Continue Requip    Hyperlipidemia -Continue atorvastatin   DVT prophylaxis: Eliquis Advance Care Planning:   Code Status: Full Code   Consults: Cardiology  Family Communication: Daughter updated at bedside  Severity of Illness: The appropriate patient status for this patient is OBSERVATION. Observation status is judged to be reasonable and necessary in order to provide the required intensity of service to ensure the patient's safety. The patient's presenting symptoms, physical exam findings, and initial radiographic and laboratory data in the context of their medical condition is felt to place them at decreased risk for further clinical deterioration.  Furthermore, it is anticipated that the patient will be medically stable for discharge from the hospital within 2 midnights of admission.   Author: Clydie Braun, MD 02/08/2024 8:24 AM  For on call review [email or web address].

## 2024-02-08 NOTE — Consult Note (Signed)
 Cardiology Consultation   Patient ID: [name] [name] MRN: [id number]; DOB: 1946/06/23  Admit date: 02/07/2024 Date of Consult: 02/08/2024  PCP:  Bradd Canary, MD   [facility] Providers Cardiologist:  Lance Muss, MD       Patient Profile:   [name] [name] is a 78 y.o. male with a hx of CAD (CABG 2000, stent to RCA 2009), paroxysmal atrial fibrillation, PSVT, CKD III, noncompliance, orthostatic hypotension, iron deficiency anemia, prior gastric erosions/ulcer, ?chronic diastolic CHF (no diastolic dysfunction on echo and normal BNP), bipolar depression, DM, HTN, HLD, kidney stones, OSA, RBBB, deconditoning, thyroid disease, spondylosis and back pain  who is being seen 02/08/2024 for the evaluation of chest pain at the request of Dr. Matt Holmes.  History of Present Illness:   [name] [name] is a 78 y.o. male with a hx of CAD (CABG 2000, stent to RCA 2009), paroxysmal atrial fibrillation, PSVT, CKD III, noncompliance, orthostatic hypotension, iron deficiency anemia, prior gastric erosions/ulcer, ?chronic diastolic CHF (no diastolic dysfunction on echo and normal BNP), bipolar depression, DM, HTN, HLD, kidney stones, OSA, RBBB, deconditoning, thyroid disease, spondylosis and back pain  who is being seen 02/08/2024 for the evaluation of chest pain   Patient was brought into the ER with complaints of chest pain that occurred at home while resting, took 2 nitroglycerin with some improvement and came to the ER.  Upon arrival to the ER he was chest pain free, initial labs including troponin x 2 negative, EKG normal sinus rhythm with right bundle branch block.  Chest pain was rapid on onset and radiated to her upper extremity as well as lower extremity, some exertional component to chest pain as well Denies any heart failure symptoms or palpitations or syncope.  Compliant with medication. He does not recall if he is taking ranexa or imdur. He reports he is on multiple drugs He had a  few more chest pains at home but was not intense like the one he had in the afternoon.  Denies any chest pain with exertion  Last seen cardiology was 06/2022 was doing well.  Does have memory issues  Cardiac testing Left heart cath 2021 Right heart cath demonstrated normal filling pressures and pulmonary artery pressure. Total occlusion of the mid LAD and diagonal.  Ramus intermedius is also totally occluded.  Mild to moderate disease is noted in the distal circumflex.  The native right coronary is totally occluded beyond the origin of the PDA. The bypass graft to the continuation of the right coronary is widely patent. The bypass graft to the diagonal is widely patent. The bypass graft to the ramus intermedius is widely patent. LIMA to the LAD is widely patent. Left ventricular function was not assessed with this study.  Left ventricular end-diastolic pressure was normal.   RECOMMENDATIONS:   In review of images, no significant change has occurred compared to 2019. If angina, consider microvascular origin.  Uncertain explanation for the patient's dyspnea.  Echo 02/2022  1. Left ventricular ejection fraction, by estimation, is 60 to 65%. The  left ventricle has normal function. The left ventricle has no regional  wall motion abnormalities. Left ventricular diastolic parameters were  normal. The average left ventricular  global longitudinal strain is -19.9 %. The global longitudinal strain is  normal.   2. Right ventricular systolic function is normal. The right ventricular  size is normal.   3. Left atrial size was mildly dilated.   4. The mitral valve is normal in structure.  Trivial mitral valve  regurgitation. No evidence of mitral stenosis.   5. The aortic valve is tricuspid. Aortic valve regurgitation is not  visualized. No aortic stenosis is present.   6. The inferior vena cava is normal in size with greater than 50%  respiratory variability, suggesting right atrial pressure of  3 mmHg.   Past Medical History:  Diagnosis Date   Acquired atrophy of thyroid 06/05/2013   Overview:  July 2014: controlled on synthroid since 2012 May 2015: decreased to Synthroid 50 mcg  Last Assessment & Plan:  Pt doing well with most recent TSH within normal range.  Will continue current dosage of Synthroid . Pt reminded to take medication on empty stomach. Will continue to monitor with periodic laboratory assessment in 3 months.    Anemia    hemoglobin 7.4, iron deficiency, January, 2011, 2 unit transfusion, endoscopy normal, capsule endoscopy February, 2011 no small bowel abnormalities.   Most likely source gastric erosions, followed by GI   Anxiety    Bradycardia    CAD (coronary artery disease)    A. CABG in 2000,status post cardiac cath in 2006, 2009 ....continued chest pain and SOB despite oral medication adjestments including Ranexa. B. Cath November 2009/ mRCA - 2.75 x 23 Abbott Xience V drug-eluting stent ...11/26/2008 to distal  RCA leading to acute marginal.  C. Cath 07/2012 for CP - stable anatomy, med rx. d. cath 2015 and 05/30/2015 stable anatomy, consider Myoview if has CP again   Carotid artery disease (HCC) 08/01/2015   Doppler, May 29, 2015, 1-39% bilateral ICA    Cerebral ischemia    MRI November, 2010, chronic microvascular ischemia   CKD (chronic kidney disease), stage III (HCC)    Concussion    Depression    Bipolar   Diabetes mellitus (HCC)    Edema    Essential hypertension 06/05/2013   Overview:  July 2014: Controlled with Bystolic Sep 2015: Changed to atenolol.  Last Assessment & Plan:  Will change to atenolol for cost and follow Improved.  Medication compliance strongly encouraged BP: 116/64 mmHg    Overview:  July 2014: Controlled with Bystolic Sep 2015: Changed to atenolol.  Last Assessment & Plan:  Will change to atenolol for cost and follow   Falling episodes    these have occurred in the past and again recurring 2011   Family history of adverse  reaction to anesthesia    "mother died during bypass surgery but not sure if it has to do with anesthesia"   Gastric ulcer    GERD (gastroesophageal reflux disease)    H/O medication noncompliance    Due to loss of insurance   H/O multiple concussions    Hard of hearing    Heart murmur    History of blood transfusion 12/20/2013   History of kidney stones    Hx of CABG    2000,  / one median sternotomy suture broken her chest x-ray November, 2010, no clinical significance   Hyperlipidemia    Hypertension    pt. denies   Iron deficiency anemia    Long term (current) use of anticoagulants [Z79.01] 07/20/2016   Low back pain 06/12/2009   Qualifier: Diagnosis of  By: Nena Jordan    Nephrolithiasis    Orthostasis    OSA (obstructive sleep apnea)    PAF (paroxysmal atrial fibrillation) (HCC)    a. dx 2017, started on amiodarone/warfarin but patient intermittently noncompliant.   PSVT (paroxysmal supraventricular tachycardia) (HCC)  RBBB 07/09/2009   Qualifier: Diagnosis of  By: Myrtis Ser, MD, [facility], Lemmie Evens    RLS (restless legs syndrome) 09/19/2009   Qualifier: Diagnosis of  By: Nena Jordan   Overview:  July 2014: Controlled with Mirapex  Last Assessment & Plan:  Patient is doing well. Will continue current management and follow clinically.   Seizure disorder (HCC) 01/09/2017   Spondylosis    C5-6, C6-7 MRI 2010   Syncope 03/2016   TBI (traumatic brain injury) (HCC) 2019   Thyroid disease    Tubulovillous adenoma of colon 2007   Vitamin D deficiency 05/17/2017   Wears glasses     Past Surgical History:  Procedure Laterality Date   ANTERIOR CERVICAL DECOMP/DISCECTOMY FUSION N/A 05/31/2016   Procedure: ANTERIOR CERVICAL DECOMPRESSION/DISCECTOMY FUSION CERVICAL FIVE-SIX,CERVICAL SIX-SEVEN;  Surgeon: Julio Sicks, MD;  Location: [facility] NEURO ORS;  Service: Neurosurgery;  Laterality: N/A;   CARDIAC CATHETERIZATION N/A 05/30/2015   Procedure: Left Heart Cath and Coronary  Angiography;  Surgeon: Marykay Lex, MD;  Location: [facility] INVASIVE CV LAB;  Service: Cardiovascular;  Laterality: N/A;   CATARACT EXTRACTION     COLONOSCOPY     CORONARY ARTERY BYPASS GRAFT     2000   ESOPHAGOGASTRODUODENOSCOPY     LEFT HEART CATH AND CORS/GRAFTS ANGIOGRAPHY N/A 12/15/2017   Procedure: LEFT HEART CATH AND CORS/GRAFTS ANGIOGRAPHY;  Surgeon: Corky Crafts, MD;  Location: [facility] INVASIVE CV LAB;  Service: Cardiovascular;  Laterality: N/A;   LEFT HEART CATHETERIZATION WITH CORONARY/GRAFT ANGIOGRAM N/A 08/01/2012   Procedure: LEFT HEART CATHETERIZATION WITH Isabel Caprice;  Surgeon: Herby Abraham, MD;  Location: [facility] CATH LAB;  Service: Cardiovascular;  Laterality: N/A;   LEFT HEART CATHETERIZATION WITH CORONARY/GRAFT ANGIOGRAM N/A 01/03/2015   Procedure: LEFT HEART CATHETERIZATION WITH Isabel Caprice;  Surgeon: Runell Gess, MD;  Location: [facility] CATH LAB;  Service: Cardiovascular;  Laterality: N/A;   NASAL SEPTUM SURGERY     UP3   PERCUTANEOUS CORONARY STENT INTERVENTION (PCI-S)  10/06/2008   mRCA PCI  2.75 x 23 Abbott Xience V drug-eluting stent    RIGHT/LEFT HEART CATH AND CORONARY/GRAFT ANGIOGRAPHY N/A 01/07/2020   Procedure: RIGHT/LEFT HEART CATH AND CORONARY/GRAFT ANGIOGRAPHY;  Surgeon: Lyn Records, MD;  Location: [facility] INVASIVE CV LAB;  Service: Cardiovascular;  Laterality: N/A;   TOTAL KNEE ARTHROPLASTY Right 09/22/2021   Procedure: RIGHT TOTAL KNEE ARTHROPLASTY;  Surgeon: Marcene Corning, MD;  Location: [facility] ORS;  Service: Orthopedics;  Laterality: Right;   ULTRASOUND GUIDANCE FOR VASCULAR ACCESS  12/15/2017   Procedure: Ultrasound Guidance For Vascular Access;  Surgeon: Corky Crafts, MD;  Location: [facility] INVASIVE CV LAB;  Service: Cardiovascular;;       Inpatient Medications: Scheduled Meds:  ondansetron  4 mg Oral Once   Continuous Infusions:  PRN Meds:   Allergies:    Allergies  Allergen Reactions   Morphine Other (See  Comments)    Hallucinations   Shellfish-Derived Products Itching    Social History:   Social History   Socioeconomic History   Marital status: Married    Spouse name: Not on file   Number of children: 4   Years of education: 13   Highest education level: Not on file  Occupational History   Occupation: retired  Tobacco Use   Smoking status: Never    Passive exposure: Never   Smokeless tobacco: Never  Vaping Use   Vaping status: Never Used  Substance and Sexual Activity   Alcohol use: No    Alcohol/week:  0.0 standard drinks of alcohol    Comment: stopped drinking in 1998   Drug use: No   Sexual activity: Not Currently    Partners: Female  Other Topics Concern   Not on file  Social History Narrative   Patient is right handed.   Patient does not drink caffeine.   Social Drivers of Corporate investment banker Strain: Low Risk  (05/24/2023)   Overall Financial Resource Strain (CARDIA)    Difficulty of Paying Living Expenses: Not very hard  Food Insecurity: No Food Insecurity (11/01/2023)   Hunger Vital Sign    Worried About Running Out of Food in the Last Year: Never true    Ran Out of Food in the Last Year: Never true  Transportation Needs: No Transportation Needs (11/01/2023)   PRAPARE - Administrator, Civil Service (Medical): No    Lack of Transportation (Non-Medical): No  Physical Activity: Insufficiently Active (08/02/2022)   Exercise Vital Sign    Days of Exercise per Week: 7 days    Minutes of Exercise per Session: 20 min  Stress: No Stress Concern Present (05/30/2023)   [employer] of Occupational Health - Occupational Stress Questionnaire    Feeling of Stress : Only a little  Social Connections: Moderately Isolated (08/02/2022)   Social Connection and Isolation Panel [NHANES]    Frequency of Communication with Friends and Family: More than three times a week    Frequency of Social Gatherings with Friends and Family: More than three times a  week    Attends Religious Services: More than 4 times per year    Active Member of [employer] or Organizations: No    Attends Banker Meetings: Never    Marital Status: Widowed  Intimate Partner Violence: Unknown (10/15/2023)   Humiliation, Afraid, Rape, and Kick questionnaire    Fear of Current or Ex-Partner: Patient declined    Emotionally Abused: No    Physically Abused: No    Sexually Abused: No    Family History:    Family History  Problem Relation Age of Onset   Pancreatic cancer Brother    Diabetes Brother    Coronary artery disease Brother    Stroke Brother    Diabetes Brother    Diabetes Mother    Heart failure Mother    Heart failure Father    Hypothyroidism Brother    Coronary artery disease Brother    Other Brother        colon surgery   Heart attack Other        Nephew   Irregular heart beat Daughter    Cancer Maternal Grandmother        unknown    Colon cancer Neg Hx    Stomach cancer Neg Hx    Liver cancer Neg Hx    Rectal cancer Neg Hx    Esophageal cancer Neg Hx      ROS:  Please see the history of present illness.   All other ROS reviewed and negative.     Physical Exam/Data:   Vitals:   02/07/24 1528 02/07/24 2023 02/08/24 0015  BP: 137/71 113/63 137/68  Pulse: 79 75 74  Resp: 16 18 14   Temp: 98.2 F (36.8 C) 98 F (36.7 C) 98.6 F (37 C)  TempSrc:  Oral Oral  SpO2: 99% 99% 99%   No intake or output data in the 24 hours ending 02/08/24 0219    02/02/2024   11:06 AM 10/28/2023   10:23  AM 10/14/2023    7:41 PM  Last 3 Weights  Weight (lbs) 170 lb 167 lb 6 oz 167 lb 1.7 oz  Weight (kg) 77.111 kg 75.921 kg 75.8 kg     There is no height or weight on file to calculate BMI.  General:  Well nourished, well developed, in no acute distress HEENT: normal Neck: no JVD Vascular: No carotid bruits; Distal pulses 2+ bilaterally Cardiac:  normal S1, S2; RRR; no murmur  Lungs:  clear to auscultation bilaterally, no wheezing, rhonchi  or rales  Abd: soft, nontender, no hepatomegaly  Ext: no edema Musculoskeletal:  No deformities, BUE and BLE strength normal and equal Skin: warm and dry  Neuro:  CNs 2-12 intact, no focal abnormalities noted Psych:  Normal affect   EKG:  The EKG was personally reviewed and demonstrates: Sinus rhythm right bundle branch block Telemetry:  Telemetry was personally reviewed and demonstrates: Normal sinus rhythm  Relevant CV Studies: As noted above  Laboratory Data:  High Sensitivity Troponin:   Recent Labs  Lab 02/07/24 1545 02/07/24 2003  TROPONINIHS 6 7     Chemistry Recent Labs  Lab 02/07/24 1545  NA 135  K 4.8  CL 101  CO2 23  GLUCOSE 178*  BUN 29*  CREATININE 1.40*  CALCIUM 9.4  GFRNONAA 52*  ANIONGAP 11    Recent Labs  Lab 02/07/24 1545  PROT 7.1  ALBUMIN 4.1  AST 33  ALT 42  ALKPHOS 94  BILITOT 1.2   Lipids No results for input(s): "CHOL", "TRIG", "HDL", "LABVLDL", "LDLCALC", "CHOLHDL" in the last 168 hours.  Hematology Recent Labs  Lab 02/07/24 1545  WBC 10.6*  RBC 5.10  HGB 15.8  HCT 45.4  MCV 89.0  MCH 31.0  MCHC 34.8  RDW 14.6  PLT 167   Thyroid No results for input(s): "TSH", "FREET4" in the last 168 hours.  BNPNo results for input(s): "BNP", "PROBNP" in the last 168 hours.  DDimer No results for input(s): "DDIMER" in the last 168 hours.   Radiology/Studies:  CT Angio Chest/Abd/Pel for Dissection W and/or Wo Contrast Result Date: 02/07/2024 CLINICAL DATA:  Chest, arm, and leg pain, sudden onset EXAM: CT ANGIOGRAPHY CHEST, ABDOMEN AND PELVIS TECHNIQUE: Non-contrast CT of the chest was initially obtained. Multidetector CT imaging through the chest, abdomen and pelvis was performed using the standard protocol during bolus administration of intravenous contrast. Multiplanar reconstructed images and MIPs were obtained and reviewed to evaluate the vascular anatomy. RADIATION DOSE REDUCTION: This exam was performed according to the departmental  dose-optimization program which includes automated exposure control, adjustment of the mA and/or kV according to patient size and/or use of iterative reconstruction technique. CONTRAST:  75mL OMNIPAQUE IOHEXOL 350 MG/ML SOLN COMPARISON:  10/14/2023 FINDINGS: CTA CHEST FINDINGS VASCULAR Aorta: Satisfactory opacification of the aorta. Normal contour and caliber of the thoracic aorta. No evidence of aneurysm, dissection, or other acute aortic pathology. Mild aortic atherosclerosis. Cardiovascular: No evidence of pulmonary embolism on limited non-tailored examination. Cardiomegaly. Three-vessel coronary artery calcifications status post median sternotomy and CABG. No pericardial effusion. Review of the MIP images confirms the above findings. NON VASCULAR Mediastinum/Nodes: No enlarged mediastinal, hilar, or axillary lymph nodes. Thyroid gland, trachea, and esophagus demonstrate no significant findings. Lungs/Pleura: Minimal bibasilar scarring or atelectasis. No pleural effusion or pneumothorax. Musculoskeletal: No chest wall abnormality. No acute osseous findings. Subacute appearing fractures of the lateral left ribs (series 8, image 68). Review of the MIP images confirms the above findings. CTA ABDOMEN AND PELVIS  FINDINGS VASCULAR Normal contour and caliber of the abdominal aorta. No evidence of aneurysm, dissection, or other acute aortic pathology. Standard branching pattern of the abdominal aorta with solitary bilateral renal arteries. Moderate aortic atherosclerosis. Review of the MIP images confirms the above findings. NON-VASCULAR Hepatobiliary: No solid liver abnormality is seen. No gallstones, gallbladder wall thickening, or biliary dilatation. Pancreas: Unremarkable. No pancreatic ductal dilatation or surrounding inflammatory changes. Spleen: Normal in size without significant abnormality. Adrenals/Urinary Tract: Adrenal glands are unremarkable. Punctuate nonobstructive calculus of the inferior pole of the  right kidney. Bladder is unremarkable. Stomach/Bowel: Stomach is within normal limits. Appendix appears normal. No evidence of bowel wall thickening, distention, or inflammatory changes. Lymphatic: No enlarged abdominal or pelvic lymph nodes. Reproductive: Prostatomegaly. Other: No abdominal wall hernia or abnormality. No ascites. Musculoskeletal: No acute osseous findings. IMPRESSION: 1. Normal contour and caliber of the thoracic and abdominal aorta. No evidence of aneurysm, dissection, or other acute aortic pathology. Aortic atherosclerosis. 2. Cardiomegaly and coronary artery disease. 3. Punctuate nonobstructive calculus of the inferior pole of the right kidney. 4. Prostatomegaly. Aortic Atherosclerosis (ICD10-I70.0). Electronically Signed   By: Jearld Lesch [name]   On: 02/07/2024 19:09   DG Chest 2 View Result Date: 02/07/2024 CLINICAL DATA:  Chest pain EXAM: CHEST - 2 VIEW COMPARISON:  10/17/2023 FINDINGS: Mild hyperinflation. Median sternotomy for CABG. Apical lordotic frontal radiograph. Midline trachea. Borderline cardiomegaly. No pleural effusion or pneumothorax. Clear lungs. IMPRESSION: No acute cardiopulmonary disease. Electronically Signed   By: Jeronimo Greaves [name]   On: 02/07/2024 18:26     Assessment and Plan:   Chest pain, mixed symptoms (neg EKG/enzymes) CAD status post CABG 2000, stent to RCA 2009.  LHC 2021: Patent LIMA to LAD, SVG to RCA, SVG to diagonal, SVG to ramus intermedius, negative severe disease.  Unchanged from 2019 Multiple other comorbidities including CKD stage IIIb, creatinine 1.4, paroxysmal A-fib.  Diabetes, hypertension, hyperlipidemia, memory issues   Plan: -Admit under medicine for observation.  Keep him n.p.o.  -Plan for nuclear stress test in a.m., if low risk and go home with continued antianginal therapy as well as aspirin and home medication.  He is currently chest pain-free, troponin x 2 negative EKG is normal sinus rhythm right bundle branch block.  If  moderate or high risk then will consider cardiac catheterization. -Obtain echocardiogram. Continue telemonitoring. Continue home medication  Will follow   Risk Assessment/Risk Scores:               For questions or updates, please contact [facility] Please consult [email or web address] for contact info under    Signed, Elmon Kirschner, MD  02/08/2024 2:19 AM

## 2024-02-08 NOTE — Discharge Summary (Signed)
 Physician Discharge Summary   Patient: [name] [name] MRN: [id number] DOB: 31-Mar-1946  Admit date:     02/07/2024  Discharge date: 02/08/24  Discharge Physician: Clydie Braun   PCP: Bradd Canary, MD   Recommendations at discharge:   Outpatient follow-up with cardiology for nuclear stress testing  Discharge Diagnoses: Principal Problem:   Chest pain Active Problems:   CAD (coronary artery disease)   Elevated lipase   Uncontrolled type 2 diabetes mellitus with hyperglycemia, with long-term current use of insulin (HCC)   CKD (chronic kidney disease), stage III (HCC)   Hypothyroidism   Memory loss   RLS (restless legs syndrome)    Hospital Course:  [name] [name] is a 78 y.o. male with medical history significant of hypertension, hyperlipidemia, paroxysmal atrial fibrillation, PSVT, CAD s/p CABG, history of heart failure, diabetes mellitus type 2, hypothyroidism, seizure disorder, iron deficiency anemia, gastric ulcer, OSA,, and chronic back pain presents with left-sided chest pain radiating to the arm. He is accompanied by his youngest daughter.   He experiences severe left-sided chest pain radiating into his arm, described as 'like lightning.' The pain was severe enough to prompt him to seek medical attention last night. He attempted to alleviate the pain with two nitroglycerin tablets, which provided some relief. During the episode of chest pain, he also experienced shortness of breath, sweating, and nausea.  Denied having any vomiting.   He reports a recent cough that has been present for about three days, which he initially thought might be related to the flu. No recent contact with individuals known to have the flu.   His feet remain swollen, and he is currently under the care of a neurologist for this issue.   He has no history of smoking and reports minimal alcohol use in the past.   On admission to the emergency department patient was noted to be afebrile with  blood pressures elevated up to 149/64, and O2 saturation maintained on room air.  Labs significant for WBC 10.6, BUN 29, creatinine 1.4, lipase 60, and high-sensitivity troponins negative x 2.  Chest x-ray showed no acute abnormality.  CT angiogram of the chest abdomen and pelvis have been obtained which noted no acute abnormality.  Chronic findings included cardiomegaly, coronary artery disease, nonobstructive calculus of the inferior pole of the right kidney, and prostamegaly.  Cardiology had been formally consulted and plan on possible stress test in a.m. patient reports being chest pain-free at this time.    Assessment and Plan: Chest pain  CAD Acute.  Patient presents with complaints of left-sided chest pain.  High-sensitivity troponins negative x 2.  Patient had taken nitroglycerin at home prior to arrival with some improvement in chest pain symptoms.  Chest x-ray showed no acute abnormality.  EKG noted a bifascicular block.  Patient with prior history of CABG last heart cath was in 01/2020 which noted total occlusion of the mid LAD, diagonal, native RCA, and ramus intermedius.  Mild to moderate disease in the distal circumflex and all bypass grafts were noted to be widely patent and unchanged from previous in 2019.  Patient had been given full dose aspirin.  Cardiology was consulted and initially recommended patient remain n.p.o. for stress testing.  Echocardiogram was obtained and noted EF to be 50 to 55% with grade 1 diastolic dysfunction similar to prior.  Cardiology evaluate the patient and recommended for outpatient nuclear stress test.  Elevated lipase Lipase elevated at 60.  CT of the chest abdomen pelvis did  not note any findings suggestive of pancreatitis.  Patient denies any significant history of alcohol abuse.  Lower suspicion for pancreatitis as cause of patient's symptoms.   Uncontrolled diabetes mellitus type 2, with long-term use of insulin On admission glucose 178.  Last hemoglobin  A1c 10.3 when checked 08/2023.  Repeat hemoglobin A1c noted to be 8 which is improved from prior. -Continue Marcelline Deist and current medication regimen.   Chronic kidney disease stage IIIa Creatinine 1.14 with BUN 29.  Hypothyroidism TSH last noted to be 10.12 when checked 09/01/2023.  TSH was improved from prior at 6.103. -Continue levothyroxine   Memory loss  -Continue Namenda   Restless leg syndrome -Continue Requip   Hyperlipidemia Lipid panel revealed total cholesterol 107, HDL 42, LDL 35, and triglycerides 147. -Continue atorvastatin           Consultants: Cardiology Procedures performed: None Disposition: Home Diet recommendation:  Cardiac and Carb modified diet DISCHARGE MEDICATION: Allergies as of 02/08/2024       Reactions   Morphine Other (See Comments)   Hallucinations   Shellfish-derived Products Itching        Medication List     TAKE these medications    amiodarone 200 MG tablet Commonly known as: PACERONE Take 1 tablet (200 mg total) by mouth daily.   amitriptyline 10 MG tablet Commonly known as: ELAVIL Take 1-3 tablets (10-30 mg total) by mouth at bedtime.   aspirin EC 81 MG tablet Take 81 mg by mouth daily. Swallow whole.   atorvastatin 80 MG tablet Commonly known as: LIPITOR Take 1 tablet (80 mg total) by mouth daily.   Eliquis 5 MG Tabs tablet Generic drug: apixaban Take 1 tablet (5 mg total) by mouth 2 (two) times daily.   Farxiga 10 MG Tabs tablet Generic drug: dapagliflozin propanediol Take 1 tablet (10 mg total) by mouth daily.   FreeStyle Libre 3 Sensor Misc Place 1 sensor on the skin every 14 days. Use to check glucose continuously   hydrOXYzine 10 MG tablet Commonly known as: ATARAX Take 1 tablet (10 mg total) by mouth 3 (three) times daily as needed for anxiety or itching.   insulin lispro 100 UNIT/ML KwikPen Commonly known as: HumaLOG KwikPen Inject 10 Units into the skin 3 (three) times daily. What changed:  how  much to take additional instructions Another medication with the same name was removed. Continue taking this medication, and follow the directions you see here.   isosorbide mononitrate 120 MG 24 hr tablet Commonly known as: IMDUR Take 1 tablet (120 mg total) by mouth daily.   levocetirizine 5 MG tablet Commonly known as: XYZAL Take 1 tablet (5 mg total) by mouth every evening.   levothyroxine 125 MCG tablet Commonly known as: SYNTHROID Take 1 tablet (125 mcg total) by mouth daily.   memantine 10 MG tablet Commonly known as: Namenda Take 1 tablet (10 mg total) by mouth 2 (two) times daily.   nitroGLYCERIN 0.4 MG SL tablet Commonly known as: NITROSTAT PLACE 1 TABLET UNDER THE TONGUE EVERY 5 MINUTES AS NEEDED FOR CHEST PAIN.   pramipexole 1 MG tablet Commonly known as: Mirapex Take 1/2 to 1 tablet (0.5-1 mg total) by mouth at bedtime.   QUEtiapine 25 MG tablet Commonly known as: SEROquel Take 1 tablet (25 mg total) by mouth at bedtime.   ranolazine 500 MG 12 hr tablet Commonly known as: RANEXA Take 1 tablet (500 mg total) by mouth 2 (two) times daily.   sertraline 100 MG tablet Commonly  known as: ZOLOFT Take 1 tablet (100 mg total) by mouth daily.   Toujeo Max SoloStar 300 UNIT/ML Solostar Pen Generic drug: insulin glargine (2 Unit Dial) Inject 60 Units into the skin daily in the afternoon.   venlafaxine XR 75 MG 24 hr capsule Commonly known as: Effexor XR Take 1 capsule (75 mg total) by mouth daily with breakfast.        Discharge Exam: There were no vitals filed for this visit. Constitutional: Elderly male currently in no acute distress Eyes: PERRL, lids and conjunctivae normal ENMT: Mucous membranes are moist.  Dentition Neck: normal, supple, no masses, no JVD appreciated. Respiratory: clear to auscultation bilaterally, no wheezing, no crackles. Normal respiratory effort. No accessory muscle use.  Cardiovascular: Regular rate and rhythm, no murmurs / rubs /  gallops. No extremity edema. 2+ pedal pulses. No carotid bruits.  Abdomen: no tenderness, no masses palpated. No hepatosplenomegaly. Bowel sounds positive.  Musculoskeletal: no clubbing / cyanosis. No joint deformity upper and lower extremities. Good ROM, no contractures. Normal muscle tone.  Skin: no rashes, lesions, ulcers. No induration Neurologic: CN 2-12 grossly intact.   Strength 5/5 in all 4.  Psychiatric: Normal judgment and insight. Alert and oriented x 3. Normal mood.   Condition at discharge: good  The results of significant diagnostics from this hospitalization (including imaging, microbiology, ancillary and laboratory) are listed below for reference.   Imaging Studies: ECHOCARDIOGRAM COMPLETE Result Date: 02/08/2024    ECHOCARDIOGRAM REPORT   Patient Name:   MUHAMMADALI RIES Date of Exam: 02/08/2024 Medical Rec #:  [id number]         Height:       68.0 in Accession #:    [id number]        Weight:       170.0 lb Date of Birth:  23-Nov-1946         BSA:          1.907 m Patient Age:    77 years          BP:           131/67 mmHg Patient Gender: M                 HR:           65 bpm. Exam Location:  Inpatient Procedure: 2D Echo, Cardiac Doppler and Color Doppler (Both Spectral and Color            Flow Doppler were utilized during procedure). Indications:    Chest Pain  History:        Patient has prior history of Echocardiogram examinations, most                 recent 02/13/2022. CAD, Prior CABG, TIA, Arrythmias:RBBB, Atrial                 Fibrillation and Bradycardia; Risk Factors:Diabetes,                 Hypertension and Dyslipidemia.  Sonographer:    Melissa Morford RDCS (AE, PE) Referring Phys: [phone] Dietrich Samuelson A Fionna Merriott IMPRESSIONS  1. Strain and 3D EF not performed. Left ventricular ejection fraction, by estimation, is 50 to 55%. The left ventricle has low normal function. The left ventricle has no regional wall motion abnormalities. Left ventricular diastolic parameters are consistent with  Grade I diastolic dysfunction (impaired relaxation).  2. Right ventricular systolic function is normal. The right ventricular size is normal.  3. Left atrial size was  mildly dilated.  4. The mitral valve is abnormal. Trivial mitral valve regurgitation. No evidence of mitral stenosis.  5. The aortic valve is tricuspid. There is mild calcification of the aortic valve. There is mild thickening of the aortic valve. Aortic valve regurgitation is not visualized. Aortic valve sclerosis is present, with no evidence of aortic valve stenosis.  6. The inferior vena cava is normal in size with greater than 50% respiratory variability, suggesting right atrial pressure of 3 mmHg. FINDINGS  Left Ventricle: Strain and 3D EF not performed. Left ventricular ejection fraction, by estimation, is 50 to 55%. The left ventricle has low normal function. The left ventricle has no regional wall motion abnormalities. Strain was performed and the global longitudinal strain is indeterminate. The left ventricular internal cavity size was normal in size. There is no left ventricular hypertrophy. Left ventricular diastolic parameters are consistent with Grade I diastolic dysfunction (impaired relaxation). Right Ventricle: The right ventricular size is normal. No increase in right ventricular wall thickness. Right ventricular systolic function is normal. Left Atrium: Left atrial size was mildly dilated. Right Atrium: Right atrial size was normal in size. Pericardium: There is no evidence of pericardial effusion. Mitral Valve: The mitral valve is abnormal. There is mild thickening of the mitral valve leaflet(s). Trivial mitral valve regurgitation. No evidence of mitral valve stenosis. Tricuspid Valve: The tricuspid valve is normal in structure. Tricuspid valve regurgitation is trivial. No evidence of tricuspid stenosis. Aortic Valve: The aortic valve is tricuspid. There is mild calcification of the aortic valve. There is mild thickening of the  aortic valve. Aortic valve regurgitation is not visualized. Aortic valve sclerosis is present, with no evidence of aortic valve stenosis. Pulmonic Valve: The pulmonic valve was normal in structure. Pulmonic valve regurgitation is not visualized. No evidence of pulmonic stenosis. Aorta: The aortic root is normal in size and structure. Venous: The inferior vena cava is normal in size with greater than 50% respiratory variability, suggesting right atrial pressure of 3 mmHg. IAS/Shunts: The interatrial septum was not well visualized. Additional Comments: 3D was performed not requiring image post processing on an independent workstation and was indeterminate.  LEFT VENTRICLE PLAX 2D LVIDd:         4.50 cm     Diastology LVIDs:         3.10 cm     LV e' medial:    4.46 cm/s LV PW:         1.00 cm     LV E/e' medial:  11.7 LV IVS:        0.80 cm     LV e' lateral:   10.60 cm/s LVOT diam:     2.00 cm     LV E/e' lateral: 4.9 LV SV:         52 LV SV Index:   27 LVOT Area:     3.14 cm  LV Volumes (MOD) LV vol d, MOD A2C: 79.5 ml LV vol d, MOD A4C: 81.7 ml LV vol s, MOD A2C: 38.2 ml LV vol s, MOD A4C: 42.5 ml LV SV MOD A2C:     41.3 ml LV SV MOD A4C:     81.7 ml LV SV MOD BP:      40.6 ml LEFT ATRIUM             Index        RIGHT ATRIUM           Index LA diam:  3.90 cm 2.04 cm/m   RA Area:     17.70 cm LA Vol (A2C):   57.3 ml 30.04 ml/m  RA Volume:   42.40 ml  22.23 ml/m LA Vol (A4C):   31.2 ml 16.36 ml/m LA Biplane Vol: 45.8 ml 24.01 ml/m  AORTIC VALVE LVOT Vmax:   83.50 cm/s LVOT Vmean:  56.300 cm/s LVOT VTI:    0.166 m  AORTA Ao Root diam: 3.60 cm Ao Asc diam:  3.20 cm MITRAL VALVE MV Area (PHT): 2.92 cm    SHUNTS MV Decel Time: 260 msec    Systemic VTI:  0.17 m MV E velocity: 52.10 cm/s  Systemic Diam: 2.00 cm MV A velocity: 73.70 cm/s MV E/A ratio:  0.71 Charlton Haws MD Electronically signed by Charlton Haws MD Signature Date/Time: 02/08/2024/12:18:09 PM    Final    CT Angio Chest/Abd/Pel for Dissection W  and/or Wo Contrast Result Date: 02/07/2024 CLINICAL DATA:  Chest, arm, and leg pain, sudden onset EXAM: CT ANGIOGRAPHY CHEST, ABDOMEN AND PELVIS TECHNIQUE: Non-contrast CT of the chest was initially obtained. Multidetector CT imaging through the chest, abdomen and pelvis was performed using the standard protocol during bolus administration of intravenous contrast. Multiplanar reconstructed images and MIPs were obtained and reviewed to evaluate the vascular anatomy. RADIATION DOSE REDUCTION: This exam was performed according to the departmental dose-optimization program which includes automated exposure control, adjustment of the mA and/or kV according to patient size and/or use of iterative reconstruction technique. CONTRAST:  75mL OMNIPAQUE IOHEXOL 350 MG/ML SOLN COMPARISON:  10/14/2023 FINDINGS: CTA CHEST FINDINGS VASCULAR Aorta: Satisfactory opacification of the aorta. Normal contour and caliber of the thoracic aorta. No evidence of aneurysm, dissection, or other acute aortic pathology. Mild aortic atherosclerosis. Cardiovascular: No evidence of pulmonary embolism on limited non-tailored examination. Cardiomegaly. Three-vessel coronary artery calcifications status post median sternotomy and CABG. No pericardial effusion. Review of the MIP images confirms the above findings. NON VASCULAR Mediastinum/Nodes: No enlarged mediastinal, hilar, or axillary lymph nodes. Thyroid gland, trachea, and esophagus demonstrate no significant findings. Lungs/Pleura: Minimal bibasilar scarring or atelectasis. No pleural effusion or pneumothorax. Musculoskeletal: No chest wall abnormality. No acute osseous findings. Subacute appearing fractures of the lateral left ribs (series 8, image 68). Review of the MIP images confirms the above findings. CTA ABDOMEN AND PELVIS FINDINGS VASCULAR Normal contour and caliber of the abdominal aorta. No evidence of aneurysm, dissection, or other acute aortic pathology. Standard branching pattern of  the abdominal aorta with solitary bilateral renal arteries. Moderate aortic atherosclerosis. Review of the MIP images confirms the above findings. NON-VASCULAR Hepatobiliary: No solid liver abnormality is seen. No gallstones, gallbladder wall thickening, or biliary dilatation. Pancreas: Unremarkable. No pancreatic ductal dilatation or surrounding inflammatory changes. Spleen: Normal in size without significant abnormality. Adrenals/Urinary Tract: Adrenal glands are unremarkable. Punctuate nonobstructive calculus of the inferior pole of the right kidney. Bladder is unremarkable. Stomach/Bowel: Stomach is within normal limits. Appendix appears normal. No evidence of bowel wall thickening, distention, or inflammatory changes. Lymphatic: No enlarged abdominal or pelvic lymph nodes. Reproductive: Prostatomegaly. Other: No abdominal wall hernia or abnormality. No ascites. Musculoskeletal: No acute osseous findings. IMPRESSION: 1. Normal contour and caliber of the thoracic and abdominal aorta. No evidence of aneurysm, dissection, or other acute aortic pathology. Aortic atherosclerosis. 2. Cardiomegaly and coronary artery disease. 3. Punctuate nonobstructive calculus of the inferior pole of the right kidney. 4. Prostatomegaly. Aortic Atherosclerosis (ICD10-I70.0). Electronically Signed   By: Jearld Lesch [name]   On: 02/07/2024 19:09  DG Chest 2 View Result Date: 02/07/2024 CLINICAL DATA:  Chest pain EXAM: CHEST - 2 VIEW COMPARISON:  10/17/2023 FINDINGS: Mild hyperinflation. Median sternotomy for CABG. Apical lordotic frontal radiograph. Midline trachea. Borderline cardiomegaly. No pleural effusion or pneumothorax. Clear lungs. IMPRESSION: No acute cardiopulmonary disease. Electronically Signed   By: Jeronimo Greaves [name]   On: 02/07/2024 18:26    Microbiology: Results for orders placed or performed in visit on 11/12/22  Urine Culture     Status: None   Collection Time: 11/12/22  8:43 AM   Specimen: Urine  Result  Value Ref Range Status   MICRO NUMBER: [id number]  Final   SPECIMEN QUALITY: Adequate  Final   Sample Source NOT GIVEN  Final   STATUS: FINAL  Final   Result: No Growth  Final   *Note: Due to a large number of results and/or encounters for the requested time period, some results have not been displayed. A complete set of results can be found in Results Review.    Labs: CBC: Recent Labs  Lab 02/07/24 1545  WBC 10.6*  HGB 15.8  HCT 45.4  MCV 89.0  PLT 167   Basic Metabolic Panel: Recent Labs  Lab 02/07/24 1545  NA 135  K 4.8  CL 101  CO2 23  GLUCOSE 178*  BUN 29*  CREATININE 1.40*  CALCIUM 9.4   Liver Function Tests: Recent Labs  Lab 02/07/24 1545  AST 33  ALT 42  ALKPHOS 94  BILITOT 1.2  PROT 7.1  ALBUMIN 4.1   CBG: Recent Labs  Lab 02/08/24 1213  GLUCAP 121*    Discharge time spent: less than 30 minutes.  Signed: Clydie Braun, MD [facility] 02/08/2024

## 2024-02-09 ENCOUNTER — Other Ambulatory Visit: Payer: Self-pay | Admitting: Family Medicine

## 2024-02-09 ENCOUNTER — Other Ambulatory Visit: Payer: Self-pay

## 2024-02-09 ENCOUNTER — Other Ambulatory Visit (HOSPITAL_COMMUNITY): Payer: Self-pay

## 2024-02-09 DIAGNOSIS — G4700 Insomnia, unspecified: Secondary | ICD-10-CM

## 2024-02-09 DIAGNOSIS — F419 Anxiety disorder, unspecified: Secondary | ICD-10-CM

## 2024-02-09 MED ORDER — SERTRALINE HCL 100 MG PO TABS
100.0000 mg | ORAL_TABLET | Freq: Every day | ORAL | 0 refills | Status: DC
  Filled 2024-02-27: qty 30, 30d supply, fill #0
  Filled 2024-03-21 – 2024-03-22 (×2): qty 30, 30d supply, fill #1
  Filled 2024-04-10 – 2024-04-20 (×3): qty 30, 30d supply, fill #2

## 2024-02-09 MED ORDER — AMITRIPTYLINE HCL 10 MG PO TABS
ORAL_TABLET | ORAL | 1 refills | Status: DC
Start: 2024-02-09 — End: 2024-04-10
  Filled 2024-02-27: qty 90, 30d supply, fill #0
  Filled 2024-03-21 – 2024-03-22 (×2): qty 90, 30d supply, fill #1

## 2024-02-15 NOTE — Addendum Note (Signed)
 Addended byYvonna Alanis on: 02/15/2024 07:25 AM   Modules accepted: Orders

## 2024-02-16 ENCOUNTER — Telehealth (HOSPITAL_COMMUNITY): Payer: Self-pay

## 2024-02-16 NOTE — Telephone Encounter (Signed)
 Detailed instructions left on the patient's answering machine. Asked to call back with any questions.  S.Alessa Mazur CCT

## 2024-02-21 ENCOUNTER — Ambulatory Visit (HOSPITAL_COMMUNITY): Attending: Cardiology

## 2024-02-21 DIAGNOSIS — I200 Unstable angina: Secondary | ICD-10-CM | POA: Diagnosis not present

## 2024-02-21 LAB — MYOCARDIAL PERFUSION IMAGING
LV dias vol: 67 mL (ref 62–150)
LV sys vol: 29 mL
Nuc Stress EF: 56 %
Peak HR: 75 {beats}/min
Rest HR: 65 {beats}/min
Rest Nuclear Isotope Dose: 8.3 mCi
SDS: 0
SRS: 0
SSS: 0
ST Depression (mm): 0 mm
Stress Nuclear Isotope Dose: 28.6 mCi
TID: 1.25

## 2024-02-21 MED ORDER — TECHNETIUM TC 99M TETROFOSMIN IV KIT
8.3000 | PACK | Freq: Once | INTRAVENOUS | Status: AC | PRN
  Administered 2024-02-21: 8.3 via INTRAVENOUS

## 2024-02-21 MED ORDER — REGADENOSON 0.4 MG/5ML IV SOLN: 0.4000 mg | Freq: Once | INTRAVENOUS | Status: AC

## 2024-02-21 MED ORDER — TECHNETIUM TC 99M TETROFOSMIN IV KIT: 28.6000 | PACK | Freq: Once | INTRAVENOUS | Status: AC | PRN

## 2024-02-23 ENCOUNTER — Ambulatory Visit: Payer: Medicare Other | Admitting: Dermatology

## 2024-02-27 ENCOUNTER — Other Ambulatory Visit: Payer: Self-pay

## 2024-02-28 ENCOUNTER — Other Ambulatory Visit: Payer: Self-pay

## 2024-03-15 ENCOUNTER — Encounter: Payer: Self-pay | Admitting: Internal Medicine

## 2024-03-15 ENCOUNTER — Other Ambulatory Visit (HOSPITAL_BASED_OUTPATIENT_CLINIC_OR_DEPARTMENT_OTHER): Payer: Self-pay

## 2024-03-15 ENCOUNTER — Ambulatory Visit: Admitting: Internal Medicine

## 2024-03-15 VITALS — BP 118/72 | HR 86 | Ht 68.0 in | Wt 166.0 lb

## 2024-03-15 DIAGNOSIS — E1159 Type 2 diabetes mellitus with other circulatory complications: Secondary | ICD-10-CM | POA: Diagnosis not present

## 2024-03-15 DIAGNOSIS — E1142 Type 2 diabetes mellitus with diabetic polyneuropathy: Secondary | ICD-10-CM | POA: Diagnosis not present

## 2024-03-15 DIAGNOSIS — N1831 Chronic kidney disease, stage 3a: Secondary | ICD-10-CM

## 2024-03-15 DIAGNOSIS — G63 Polyneuropathy in diseases classified elsewhere: Secondary | ICD-10-CM

## 2024-03-15 DIAGNOSIS — E1122 Type 2 diabetes mellitus with diabetic chronic kidney disease: Secondary | ICD-10-CM

## 2024-03-15 DIAGNOSIS — Z794 Long term (current) use of insulin: Secondary | ICD-10-CM | POA: Diagnosis not present

## 2024-03-15 MED ORDER — FREESTYLE LIBRE 3 PLUS SENSOR MISC
1.0000 | 3 refills | Status: DC
  Filled 2024-03-15 – 2024-03-16 (×2): qty 6, 90d supply, fill #0
  Filled 2024-03-19: qty 4, 60d supply, fill #0
  Filled 2024-03-19: qty 6, 90d supply, fill #0
  Filled 2024-05-09: qty 6, 90d supply, fill #1
  Filled 2024-07-16: qty 6, 90d supply, fill #2

## 2024-03-15 MED ORDER — INSULIN LISPRO (1 UNIT DIAL) 100 UNIT/ML (KWIKPEN)
10.0000 [IU] | PEN_INJECTOR | Freq: Three times a day (TID) | SUBCUTANEOUS | 3 refills | Status: DC
Start: 2024-03-15 — End: 2024-07-19
  Filled 2024-03-15: qty 60, 100d supply, fill #0
  Filled 2024-07-06: qty 60, 100d supply, fill #1

## 2024-03-15 MED ORDER — TOUJEO MAX SOLOSTAR 300 UNIT/ML ~~LOC~~ SOPN
60.0000 [IU] | PEN_INJECTOR | Freq: Every day | SUBCUTANEOUS | 3 refills | Status: DC
  Filled 2024-03-15: qty 24, 120d supply, fill #0
  Filled 2024-03-21: qty 30, 150d supply, fill #0
  Filled 2024-03-22: qty 6, 30d supply, fill #0
  Filled 2024-04-10 – 2024-04-17 (×2): qty 6, 30d supply, fill #1
  Filled 2024-05-09 – 2024-05-17 (×3): qty 6, 30d supply, fill #2
  Filled 2024-06-13: qty 6, 30d supply, fill #3
  Filled 2024-07-19: qty 6, 30d supply, fill #4

## 2024-03-15 MED ORDER — FREESTYLE LIBRE 3 READER DEVI
1.0000 | Freq: Every day | 0 refills | Status: DC
  Filled 2024-03-15: qty 1, 30d supply, fill #0

## 2024-03-15 NOTE — Progress Notes (Signed)
 Name: [name] [name]  Age/ Sex: 78 y.o., male   MRN/ DOB: [id number], 1946-06-15     PCP: Bradd Canary, MD   Reason for Endocrinology Evaluation: Type 2 Diabetes Mellitus  Initial Endocrine Consultative Visit: 11/21/2017    PATIENT IDENTIFIER: [name] [name] is a 78 y.o. male with a past medical history of T2DM, HTN, CAD,Hypothyroidism CHF and OSA. The patient has followed with Endocrinology clinic since 11/21/2017 for consultative assistance with management of his diabetes.   On his initia visit with me 04/2022 he was on NPH only with an A1c 8.7 % . He declined SGLT-2i and opted for prandial insulin . We restarted toujeo and stopped NPH  DIABETIC HISTORY:  [name] [name] was diagnosed with DM in 1989.He was on Metformin at some point.  His hemoglobin A1c has ranged from 6.4% in 2022, peaking at 8.7% in 2023.   SUBJECTIVE:   During the last visit (05/13/2023): A1c 10.6 %  Today (03/15/2024): [name] [name] is here for follow-up on diabetes management. He is accompanied by his daughter today .  He checks his blood sugars multiple  times daily through CGM. The patient has had hypoglycemic episodes since the last clinic visit, patient is symptomatic with these episodes.  Patient presented to the ED with chest pain 02/07/2024, troponins negative, his TSH was slightly elevated at 6.103 uIU/mL, patient on levothyroxine 125 mcg daily   He continues to follow-up with neurology for mild cognitive impairment, mild dementia and gait instability, with falls  Patient was diagnosed with left 5/6 rib fractures as well as hemopneumothorax 10/2023 following a mechanical fall  He was seen by podiatry 09/15/2023 Has Numbness of feet  Denies nausea, vomiting Denies constipation or diarrhea   HOME DIABETES REGIMEN:  Farxiga 10 mg daily Toujeo 60 units daily  Humalog 12 units TID QAC Correction Factor : HUmalog (BG-130/25)      Statin: yes ACE-I/ARB: no      CONTINUOUS GLUCOSE  MONITORING RECORD INTERPRETATION      Dates of Recording: 3/28-4/09/2024  Sensor description: [employer] 2  Results statistics:   CGM use % of time 95  Average and SD 184/38.2  Time in range 53  %  % Time Above 180 29  % Time above 250 16  % Time Below target 2   Glycemic patterns summary: Bg's are optimal at night, and fluctuate throughout the day Hyperglycemic episodes postprandial  Hypoglycemic episodes occurred after bolus  Overnight periods: Optimal       DIABETIC COMPLICATIONS: Microvascular complications:  Neuropathy, CKD III Denies:  Last Eye Exam: Completed 10/22/2022  Macrovascular complications:  CAD, PVD Denies: CVA   HISTORY:  Past Medical History:  Past Medical History:  Diagnosis Date   Acquired atrophy of thyroid 06/05/2013   Overview:  July 2014: controlled on synthroid since 2012 May 2015: decreased to Synthroid 50 mcg  Last Assessment & Plan:  Pt doing well with most recent TSH within normal range.  Will continue current dosage of Synthroid . Pt reminded to take medication on empty stomach. Will continue to monitor with periodic laboratory assessment in 3 months.    Anemia    hemoglobin 7.4, iron deficiency, January, 2011, 2 unit transfusion, endoscopy normal, capsule endoscopy February, 2011 no small bowel abnormalities.   Most likely source gastric erosions, followed by GI   Anxiety    Bradycardia    CAD (coronary artery disease)    A. CABG in 2000,status post cardiac cath in 2006, 2009 .Marland KitchenMarland KitchenMarland Kitchen  continued chest pain and SOB despite oral medication adjestments including Ranexa. B. Cath November 2009/ mRCA - 2.75 x 23 Abbott Xience V drug-eluting stent ...11/26/2008 to distal  RCA leading to acute marginal.  C. Cath 07/2012 for CP - stable anatomy, med rx. d. cath 2015 and 05/30/2015 stable anatomy, consider Myoview if has CP again   Carotid artery disease (HCC) 08/01/2015   Doppler, May 29, 2015, 1-39% bilateral ICA    Cerebral  ischemia    MRI November, 2010, chronic microvascular ischemia   CKD (chronic kidney disease), stage III (HCC)    Concussion    Depression    Bipolar   Diabetes mellitus (HCC)    Edema    Essential hypertension 06/05/2013   Overview:  July 2014: Controlled with Bystolic Sep 2015: Changed to atenolol.  Last Assessment & Plan:  Will change to atenolol for cost and follow Improved.  Medication compliance strongly encouraged BP: 116/64 mmHg    Overview:  July 2014: Controlled with Bystolic Sep 2015: Changed to atenolol.  Last Assessment & Plan:  Will change to atenolol for cost and follow   Falling episodes    these have occurred in the past and again recurring 2011   Family history of adverse reaction to anesthesia    "mother died during bypass surgery but not sure if it has to do with anesthesia"   Gastric ulcer    GERD (gastroesophageal reflux disease)    H/O medication noncompliance    Due to loss of insurance   H/O multiple concussions    Hard of hearing    Heart murmur    History of blood transfusion 12/20/2013   History of kidney stones    Hx of CABG    2000,  / one median sternotomy suture broken her chest x-ray November, 2010, no clinical significance   Hyperlipidemia    Hypertension    pt. denies   Iron deficiency anemia    Long term (current) use of anticoagulants [Z79.01] 07/20/2016   Low back pain 06/12/2009   Qualifier: Diagnosis of  By: Nena Jordan    Nephrolithiasis    Orthostasis    OSA (obstructive sleep apnea)    PAF (paroxysmal atrial fibrillation) (HCC)    a. dx 2017, started on amiodarone/warfarin but patient intermittently noncompliant.   PSVT (paroxysmal supraventricular tachycardia) (HCC)    RBBB 07/09/2009   Qualifier: Diagnosis of  By: Myrtis Ser, MD, [facility], Lemmie Evens    RLS (restless legs syndrome) 09/19/2009   Qualifier: Diagnosis of  By: Nena Jordan   Overview:  July 2014: Controlled with Mirapex  Last Assessment & Plan:  Patient is doing  well. Will continue current management and follow clinically.   Seizure disorder (HCC) 01/09/2017   Spondylosis    C5-6, C6-7 MRI 2010   Syncope 03/2016   TBI (traumatic brain injury) (HCC) 2019   Thyroid disease    Tubulovillous adenoma of colon 2007   Vitamin D deficiency 05/17/2017   Wears glasses    Past Surgical History:  Past Surgical History:  Procedure Laterality Date   ANTERIOR CERVICAL DECOMP/DISCECTOMY FUSION N/A 05/31/2016   Procedure: ANTERIOR CERVICAL DECOMPRESSION/DISCECTOMY FUSION CERVICAL FIVE-SIX,CERVICAL SIX-SEVEN;  Surgeon: Julio Sicks, MD;  Location: [facility] NEURO ORS;  Service: Neurosurgery;  Laterality: N/A;   CARDIAC CATHETERIZATION N/A 05/30/2015   Procedure: Left Heart Cath and Coronary Angiography;  Surgeon: Marykay Lex, MD;  Location: [facility] INVASIVE CV LAB;  Service: Cardiovascular;  Laterality: N/A;   CATARACT  EXTRACTION     COLONOSCOPY     CORONARY ARTERY BYPASS GRAFT     2000   ESOPHAGOGASTRODUODENOSCOPY     LEFT HEART CATH AND CORS/GRAFTS ANGIOGRAPHY N/A 12/15/2017   Procedure: LEFT HEART CATH AND CORS/GRAFTS ANGIOGRAPHY;  Surgeon: Corky Crafts, MD;  Location: [facility] INVASIVE CV LAB;  Service: Cardiovascular;  Laterality: N/A;   LEFT HEART CATHETERIZATION WITH CORONARY/GRAFT ANGIOGRAM N/A 08/01/2012   Procedure: LEFT HEART CATHETERIZATION WITH Isabel Caprice;  Surgeon: Herby Abraham, MD;  Location: [facility] CATH LAB;  Service: Cardiovascular;  Laterality: N/A;   LEFT HEART CATHETERIZATION WITH CORONARY/GRAFT ANGIOGRAM N/A 01/03/2015   Procedure: LEFT HEART CATHETERIZATION WITH Isabel Caprice;  Surgeon: Runell Gess, MD;  Location: [facility] CATH LAB;  Service: Cardiovascular;  Laterality: N/A;   NASAL SEPTUM SURGERY     UP3   PERCUTANEOUS CORONARY STENT INTERVENTION (PCI-S)  10/06/2008   mRCA PCI  2.75 x 23 Abbott Xience V drug-eluting stent    RIGHT/LEFT HEART CATH AND CORONARY/GRAFT ANGIOGRAPHY N/A 01/07/2020   Procedure: RIGHT/LEFT  HEART CATH AND CORONARY/GRAFT ANGIOGRAPHY;  Surgeon: Lyn Records, MD;  Location: [facility] INVASIVE CV LAB;  Service: Cardiovascular;  Laterality: N/A;   TOTAL KNEE ARTHROPLASTY Right 09/22/2021   Procedure: RIGHT TOTAL KNEE ARTHROPLASTY;  Surgeon: Marcene Corning, MD;  Location: [facility] ORS;  Service: Orthopedics;  Laterality: Right;   ULTRASOUND GUIDANCE FOR VASCULAR ACCESS  12/15/2017   Procedure: Ultrasound Guidance For Vascular Access;  Surgeon: Corky Crafts, MD;  Location: [facility] INVASIVE CV LAB;  Service: Cardiovascular;;   Social History:  reports that he has never smoked. He has never been exposed to tobacco smoke. He has never used smokeless tobacco. He reports that he does not drink alcohol and does not use drugs. Family History:  Family History  Problem Relation Age of Onset   Pancreatic cancer Brother    Diabetes Brother    Coronary artery disease Brother    Stroke Brother    Diabetes Brother    Diabetes Mother    Heart failure Mother    Heart failure Father    Hypothyroidism Brother    Coronary artery disease Brother    Other Brother        colon surgery   Heart attack Other        Nephew   Irregular heart beat Daughter    Cancer Maternal Grandmother        unknown    Colon cancer Neg Hx    Stomach cancer Neg Hx    Liver cancer Neg Hx    Rectal cancer Neg Hx    Esophageal cancer Neg Hx      HOME MEDICATIONS: Allergies as of 03/15/2024       Reactions   Morphine Other (See Comments)   Hallucinations   Shellfish-derived Products Itching        Medication List        Accurate as of March 15, 2024  8:14 AM. If you have any questions, ask your nurse or doctor.          amiodarone 200 MG tablet Commonly known as: PACERONE Take 1 tablet (200 mg total) by mouth daily.   amitriptyline 10 MG tablet Commonly known as: ELAVIL Take 1-3 tablets (10-30 mg total) by mouth at bedtime.   aspirin EC 81 MG tablet Take 81 mg by mouth daily. Swallow whole.    atorvastatin 80 MG tablet Commonly known as: LIPITOR Take 1 tablet (80 mg total) by mouth daily.  Eliquis 5 MG Tabs tablet Generic drug: apixaban Take 1 tablet (5 mg total) by mouth 2 (two) times daily.   Farxiga 10 MG Tabs tablet Generic drug: dapagliflozin propanediol Take 1 tablet (10 mg total) by mouth daily.   FreeStyle Libre 3 Sensor Misc Place 1 sensor on the skin every 14 days. Use to check glucose continuously   hydrOXYzine 10 MG tablet Commonly known as: ATARAX Take 1 tablet (10 mg total) by mouth 3 (three) times daily as needed for anxiety or itching. What changed:  how much to take when to take this   insulin lispro 100 UNIT/ML KwikPen Commonly known as: HumaLOG KwikPen Inject 10 Units into the skin 3 (three) times daily. What changed:  how much to take additional instructions   isosorbide mononitrate 120 MG 24 hr tablet Commonly known as: IMDUR Take 1 tablet (120 mg total) by mouth daily.   levocetirizine 5 MG tablet Commonly known as: XYZAL Take 1 tablet (5 mg total) by mouth every evening.   levothyroxine 125 MCG tablet Commonly known as: SYNTHROID Take 1 tablet (125 mcg total) by mouth daily.   memantine 10 MG tablet Commonly known as: Namenda Take 1 tablet (10 mg total) by mouth 2 (two) times daily.   nitroGLYCERIN 0.4 MG SL tablet Commonly known as: NITROSTAT PLACE 1 TABLET UNDER THE TONGUE EVERY 5 MINUTES AS NEEDED FOR CHEST PAIN.   pramipexole 1 MG tablet Commonly known as: Mirapex Take 1/2 - 1 tablet by mouth at bedtime   QUEtiapine 25 MG tablet Commonly known as: SEROquel Take 1 tablet (25 mg total) by mouth at bedtime.   ranolazine 500 MG 12 hr tablet Commonly known as: RANEXA Take 1 tablet (500 mg total) by mouth 2 (two) times daily.   sertraline 100 MG tablet Commonly known as: ZOLOFT Take 1 tablet (100 mg total) by mouth daily.   Toujeo Max SoloStar 300 UNIT/ML Solostar Pen Generic drug: insulin glargine (2 Unit  Dial) Inject 60 Units into the skin daily in the afternoon. What changed: how much to take   venlafaxine XR 75 MG 24 hr capsule Commonly known as: Effexor XR Take 1 capsule (75 mg total) by mouth daily with breakfast.         OBJECTIVE:   Vital Signs: BP 118/72 (BP Location: Left Arm, Patient Position: Sitting, Cuff Size: Normal)   Pulse 86   Ht 5\' 8"  (1.727 m)   Wt 166 lb (75.3 kg)   SpO2 95%   BMI 25.24 kg/m   Wt Readings from Last 3 Encounters:  03/15/24 166 lb (75.3 kg)  02/02/24 170 lb (77.1 kg)  10/28/23 167 lb 6 oz (75.9 kg)     Exam: General: Pt appears well and is in NAD  Lungs: Clear with good BS bilat  Heart: RRR   Abdomen:  soft, nontender  Extremities: No  pretibial edema.   Neuro: MS is good with appropriate affect, pt is alert and Ox3     DM foot exam 09/15/2023 per podiatry     DATA REVIEWED:  Lab Results  Component Value Date   HGBA1C 8.0 (H) 02/07/2024   HGBA1C 10.3 (H) 09/01/2023   HGBA1C 10.6 (A) 05/13/2023    Latest Reference Range & Units 02/07/24 15:45  Sodium 135 - 145 mmol/L 135  Potassium 3.5 - 5.1 mmol/L 4.8  Chloride 98 - 111 mmol/L 101  CO2 22 - 32 mmol/L 23  Glucose 70 - 99 mg/dL 161 (H)  Mean Plasma Glucose mg/dL  182.9  BUN 8 - 23 mg/dL 29 (H)  Creatinine 1.61 - 1.24 mg/dL 0.96 (H)  Calcium 8.9 - 10.3 mg/dL 9.4  Anion gap 5 - 15  11  Alkaline Phosphatase 38 - 126 U/L 94  Albumin 3.5 - 5.0 g/dL 4.1  Lipase 11 - 51 U/L 60 (H)  AST 15 - 41 U/L 33  ALT 0 - 44 U/L 42  Total Protein 6.5 - 8.1 g/dL 7.1  Total Bilirubin 0.0 - 1.2 mg/dL 1.2  GFR, Estimated >04 mL/min 52 (L)    Latest Reference Range & Units 02/08/24 08:36  Total CHOL/HDL Ratio RATIO 2.5  Cholesterol 0 - 200 mg/dL 540  HDL Cholesterol >98 mg/dL 42  LDL (calc) 0 - 99 mg/dL 35  Triglycerides <119 mg/dL 147  VLDL 0 - 40 mg/dL 30    Old records , labs and images have been reviewed.    ASSESSMENT / PLAN / RECOMMENDATIONS:   1) Type 2 Diabetes  Mellitus, poorly controlled, With neuropathic, CKD III and macrovascular complications - Most recent A1c of 8.0%. Goal A1c <7.0%.    -His A1c has trended down from 10.6% to 8.0% -He has been living with his daughter [name], and they have come up with a good system to help improve medication intake -Main barriers to diabetes self-care is cognitive impairment -He has been noted with hypoglycemia after bolus, will decrease Humalog as below  -Patient would like a prescription for freestyle libre receiver as he did not like using the phone, which was sent today   MEDICATIONS: Continue Farxiga 10 mg daily Continue Toujeo 60 units ONCE daily  Decrease Humalog 10 units TIDQAC Continue correction Factor : HUmalog (BG -130/25) 3 times daily before every meal  EDUCATION / INSTRUCTIONS: BG monitoring instructions: Patient is instructed to check his blood sugars 3 times a day, before meals . Call [facility] if: BG persistently < 70  I reviewed the Rule of 15 for the treatment of hypoglycemia in detail with the patient. Literature supplied.   2) Diabetic complications:  Eye: Does not have known diabetic retinopathy.  Neuro/ Feet: Does  have known diabetic peripheral neuropathy .  Renal: Patient does  have known baseline CKD. He   is not on an ACEI/ARB at present.    3)Hypothyroidism:  -Patient is clinically euthyroid -His TSH is above goal, but it has been trending down, question imperfect adherence -He is not using the pillbox, will not make any changes at this time but if his TSH remains elevated we will adjust the dose  Medication Continue levothyroxine 125 mcg daily   F/U in 4 months     I spent 25 minutes preparing to see the patient by review of recent labs, imaging and procedures, obtaining and reviewing separately obtained history, communicating with the patient/family or caregiver, ordering medications, tests or procedures, and documenting clinical information in  the EHR including the differential Dx, treatment, and any further evaluation and other management     Signed electronically by: Lyndle Herrlich, MD  [facility]  [facility] [address] [place] [place], [place] [address] Phone: [phone] FAX: [phone]   CC: Bradd Canary, MD [address] [place] [address] Phone: [phone]  Fax: [phone]  Return to Endocrinology clinic as below: Future Appointments  Date Time Provider Department Center  02/07/2025 11:45 AM Windell Norfolk, MD [facility] None

## 2024-03-15 NOTE — Patient Instructions (Signed)
-   Continue Farxiga 10 mg , 1 tablet every morning  - Continue Toujeo 60 units ONCE daily  - Decrease Humalog 10 units with each meal ( breakfast, lunch and supper) -Humalog correctional insulin: ADD extra units on insulin to your meal-time Humalog dose if your blood sugars are higher than 150. Use the scale below to help guide you:   Blood sugar before meal Number of units to inject  Less than 155 0 unit  156 - 180 1 units  181 -  205 2 units  206 -  230 3 units  231 -  255 4 units  256 -  280 5 units  281 -  305 6 units  306 -  330 7 units  331 -  355 8 units  356 - 380 9 units    HOW TO TREAT LOW BLOOD SUGARS (Blood sugar LESS THAN 70 MG/DL) Please follow the RULE OF 15 for the treatment of hypoglycemia treatment (when your (blood sugars are less than 70 mg/dL)   STEP 1: Take 15 grams of carbohydrates when your blood sugar is low, which includes:  3-4 GLUCOSE TABS  OR 3-4 OZ OF JUICE OR REGULAR SODA OR ONE TUBE OF GLUCOSE GEL    STEP 2: RECHECK blood sugar in 15 MINUTES STEP 3: If your blood sugar is still low at the 15 minute recheck --> then, go back to STEP 1 and treat AGAIN with another 15 grams of carbohydrates.

## 2024-03-16 ENCOUNTER — Encounter: Payer: Self-pay | Admitting: Internal Medicine

## 2024-03-16 ENCOUNTER — Other Ambulatory Visit (HOSPITAL_BASED_OUTPATIENT_CLINIC_OR_DEPARTMENT_OTHER): Payer: Self-pay

## 2024-03-16 LAB — MICROALBUMIN / CREATININE URINE RATIO
Creatinine, Urine: 52 mg/dL (ref 20–320)
Microalb Creat Ratio: 8 mg/g{creat} (ref <30)
Microalb, Ur: 0.4 mg/dL

## 2024-03-19 ENCOUNTER — Other Ambulatory Visit (HOSPITAL_BASED_OUTPATIENT_CLINIC_OR_DEPARTMENT_OTHER): Payer: Self-pay

## 2024-03-19 ENCOUNTER — Other Ambulatory Visit: Payer: Self-pay

## 2024-03-21 ENCOUNTER — Other Ambulatory Visit (HOSPITAL_COMMUNITY): Payer: Self-pay

## 2024-03-21 ENCOUNTER — Other Ambulatory Visit: Payer: Self-pay

## 2024-03-21 ENCOUNTER — Other Ambulatory Visit: Payer: Self-pay | Admitting: Family Medicine

## 2024-03-21 MED ORDER — RANOLAZINE ER 500 MG PO TB12
500.0000 mg | ORAL_TABLET | Freq: Two times a day (BID) | ORAL | 0 refills | Status: DC
  Filled 2024-03-21: qty 180, 90d supply, fill #0
  Filled 2024-03-22: qty 60, 30d supply, fill #0
  Filled 2024-04-10 – 2024-04-20 (×3): qty 60, 30d supply, fill #1
  Filled 2024-05-09 – 2024-05-17 (×3): qty 60, 30d supply, fill #2

## 2024-03-21 MED ORDER — HYDROXYZINE HCL 10 MG PO TABS
10.0000 mg | ORAL_TABLET | Freq: Three times a day (TID) | ORAL | 0 refills | Status: AC | PRN
  Filled 2024-03-21 – 2024-10-13 (×3): qty 270, 90d supply, fill #0

## 2024-03-21 MED ORDER — ISOSORBIDE MONONITRATE ER 120 MG PO TB24
120.0000 mg | ORAL_TABLET | Freq: Every day | ORAL | 0 refills | Status: DC
  Filled 2024-03-21: qty 90, 90d supply, fill #0
  Filled 2024-03-22: qty 30, 30d supply, fill #0
  Filled 2024-04-10 – 2024-04-20 (×3): qty 30, 30d supply, fill #1
  Filled 2024-05-09 – 2024-05-17 (×3): qty 30, 30d supply, fill #2

## 2024-03-22 ENCOUNTER — Other Ambulatory Visit: Payer: Self-pay

## 2024-03-22 ENCOUNTER — Other Ambulatory Visit (HOSPITAL_COMMUNITY): Payer: Self-pay

## 2024-03-23 ENCOUNTER — Other Ambulatory Visit: Payer: Self-pay

## 2024-03-26 ENCOUNTER — Other Ambulatory Visit: Payer: Self-pay

## 2024-04-10 ENCOUNTER — Other Ambulatory Visit: Payer: Self-pay | Admitting: Family Medicine

## 2024-04-10 ENCOUNTER — Other Ambulatory Visit: Payer: Self-pay | Admitting: Internal Medicine

## 2024-04-10 ENCOUNTER — Other Ambulatory Visit: Payer: Self-pay | Admitting: Neurology

## 2024-04-10 ENCOUNTER — Other Ambulatory Visit: Payer: Self-pay

## 2024-04-10 DIAGNOSIS — F419 Anxiety disorder, unspecified: Secondary | ICD-10-CM

## 2024-04-10 DIAGNOSIS — L308 Other specified dermatitis: Secondary | ICD-10-CM

## 2024-04-10 DIAGNOSIS — G4700 Insomnia, unspecified: Secondary | ICD-10-CM

## 2024-04-11 ENCOUNTER — Other Ambulatory Visit: Payer: Self-pay

## 2024-04-11 ENCOUNTER — Other Ambulatory Visit (HOSPITAL_COMMUNITY): Payer: Self-pay

## 2024-04-11 MED ORDER — QUETIAPINE FUMARATE 25 MG PO TABS
25.0000 mg | ORAL_TABLET | Freq: Every day | ORAL | 6 refills | Status: DC
  Filled 2024-04-11 – 2024-04-20 (×3): qty 30, 30d supply, fill #0
  Filled 2024-05-09 – 2024-05-17 (×3): qty 30, 30d supply, fill #1
  Filled 2024-06-13: qty 30, 30d supply, fill #2
  Filled 2024-07-19: qty 30, 30d supply, fill #3
  Filled 2024-08-21 – 2024-08-28 (×3): qty 30, 30d supply, fill #4
  Filled 2024-09-17 – 2024-09-24 (×2): qty 30, 30d supply, fill #5
  Filled 2024-10-13 – 2024-10-23 (×2): qty 30, 30d supply, fill #6

## 2024-04-11 MED ORDER — FREESTYLE LIBRE 3 READER DEVI
1.0000 | Freq: Every day | 0 refills | Status: DC
  Filled 2024-04-11 – 2024-07-05 (×4): qty 1, 30d supply, fill #0
  Filled 2024-07-06: qty 1, 90d supply, fill #0
  Filled 2024-07-13: qty 1, 30d supply, fill #0

## 2024-04-11 MED ORDER — AMITRIPTYLINE HCL 10 MG PO TABS
10.0000 mg | ORAL_TABLET | Freq: Every day | ORAL | 0 refills | Status: DC
  Filled 2024-04-11: qty 270, 90d supply, fill #0
  Filled 2024-04-12: qty 90, 30d supply, fill #0
  Filled 2024-04-12: qty 270, 90d supply, fill #0
  Filled 2024-04-17 – 2024-04-24 (×3): qty 90, 30d supply, fill #0
  Filled 2024-05-17 (×3): qty 90, 30d supply, fill #1
  Filled 2024-06-13 (×2): qty 90, 30d supply, fill #2

## 2024-04-11 MED ORDER — LEVOCETIRIZINE DIHYDROCHLORIDE 5 MG PO TABS
5.0000 mg | ORAL_TABLET | Freq: Every evening | ORAL | 0 refills | Status: DC
  Filled 2024-04-11: qty 90, 90d supply, fill #0
  Filled 2024-04-18 – 2024-04-20 (×2): qty 30, 30d supply, fill #0
  Filled 2024-05-09 – 2024-05-17 (×3): qty 30, 30d supply, fill #1
  Filled 2024-06-13: qty 30, 30d supply, fill #2

## 2024-04-12 ENCOUNTER — Other Ambulatory Visit (HOSPITAL_COMMUNITY): Payer: Self-pay

## 2024-04-12 ENCOUNTER — Other Ambulatory Visit: Payer: Self-pay

## 2024-04-12 ENCOUNTER — Other Ambulatory Visit (HOSPITAL_BASED_OUTPATIENT_CLINIC_OR_DEPARTMENT_OTHER): Payer: Self-pay

## 2024-04-13 ENCOUNTER — Telehealth: Payer: Self-pay | Admitting: Physician Assistant

## 2024-04-13 NOTE — Telephone Encounter (Signed)
 Trula Gable is requesting a callback regarding her needing a copy of the referral for the pt to be seen there faxed over to them. She stated the pt came in he was referred there by Lovette Rud but they don't have a referral. Please advise.

## 2024-04-13 NOTE — Telephone Encounter (Signed)
 Patient identification verified by 2 forms. Hilton Lucky, RN    Called and spoke to patients daughter [name] states:   -patient will not be having oral surgery appointment, it was canceled    -will be looking for a new office   -new to taking over patients care, unsure what is needed   -patient is having pain with his teeth, unable to eat and causing weight loss   -patient need teeth implant/dentures   -PCP is following weight/nutrition issue as well   -at this time patient does not have cardiology symptoms or concerns  Informed Angela:   -patient will likely need surgical clearance  -patient needs new appointment with new cardiologist RN offered OV 5/12 with Dr. Elnita Hai declined OV would like a different provider  Patient scheduled for 6/11 at 9:00 AM with Dr. Floreen Hunger has no further questions at this time

## 2024-04-16 ENCOUNTER — Other Ambulatory Visit: Payer: Self-pay | Admitting: Family Medicine

## 2024-04-17 ENCOUNTER — Other Ambulatory Visit: Payer: Self-pay

## 2024-04-17 ENCOUNTER — Other Ambulatory Visit (HOSPITAL_BASED_OUTPATIENT_CLINIC_OR_DEPARTMENT_OTHER): Payer: Self-pay

## 2024-04-17 ENCOUNTER — Other Ambulatory Visit (HOSPITAL_COMMUNITY): Payer: Self-pay

## 2024-04-17 MED ORDER — NITROGLYCERIN 0.4 MG SL SUBL
SUBLINGUAL_TABLET | SUBLINGUAL | 1 refills | Status: DC
  Filled 2024-04-17: qty 25, 5d supply, fill #0
  Filled 2024-04-17: qty 25, 5d supply, fill #1

## 2024-04-18 ENCOUNTER — Other Ambulatory Visit: Payer: Self-pay

## 2024-04-18 ENCOUNTER — Other Ambulatory Visit: Payer: Self-pay | Admitting: Family Medicine

## 2024-04-18 ENCOUNTER — Other Ambulatory Visit (HOSPITAL_COMMUNITY): Payer: Self-pay

## 2024-04-18 MED ORDER — LEVOTHYROXINE SODIUM 125 MCG PO TABS
125.0000 ug | ORAL_TABLET | Freq: Every day | ORAL | 0 refills | Status: DC
  Filled 2024-04-18 – 2024-04-20 (×2): qty 30, 30d supply, fill #0
  Filled 2024-05-09 – 2024-05-17 (×3): qty 30, 30d supply, fill #1
  Filled 2024-06-13: qty 30, 30d supply, fill #2

## 2024-04-19 ENCOUNTER — Other Ambulatory Visit (HOSPITAL_COMMUNITY): Payer: Self-pay

## 2024-04-19 ENCOUNTER — Other Ambulatory Visit: Payer: Self-pay

## 2024-04-20 ENCOUNTER — Other Ambulatory Visit: Payer: Self-pay | Admitting: Family Medicine

## 2024-04-20 ENCOUNTER — Other Ambulatory Visit: Payer: Self-pay

## 2024-04-23 ENCOUNTER — Other Ambulatory Visit (HOSPITAL_COMMUNITY): Payer: Self-pay

## 2024-04-23 ENCOUNTER — Other Ambulatory Visit: Payer: Self-pay

## 2024-04-24 ENCOUNTER — Other Ambulatory Visit: Payer: Self-pay

## 2024-05-01 ENCOUNTER — Encounter: Payer: Self-pay | Admitting: Neurology

## 2024-05-01 ENCOUNTER — Encounter: Payer: Self-pay | Admitting: Physician Assistant

## 2024-05-01 ENCOUNTER — Ambulatory Visit (INDEPENDENT_AMBULATORY_CARE_PROVIDER_SITE_OTHER): Admitting: Physician Assistant

## 2024-05-01 VITALS — BP 145/62 | HR 65 | Ht 68.0 in | Wt 177.4 lb

## 2024-05-01 DIAGNOSIS — R4182 Altered mental status, unspecified: Secondary | ICD-10-CM | POA: Diagnosis not present

## 2024-05-01 LAB — POCT URINALYSIS DIP (MANUAL ENTRY)
Bilirubin, UA: NEGATIVE
Blood, UA: NEGATIVE
Glucose, UA: >=1000 mg/dL — AB
Ketones, POC UA: NEGATIVE mg/dL
Leukocytes, UA: NEGATIVE
Nitrite, UA: NEGATIVE
Protein Ur, POC: NEGATIVE mg/dL
Spec Grav, UA: 1.015 (ref 1.010–1.025)
Urobilinogen, UA: 0.2 U/dL
pH, UA: 5 (ref 5.0–8.0)

## 2024-05-01 NOTE — Progress Notes (Signed)
 Established patient visit   Patient: [name] [name]   DOB: [birth year]   78 y.o. Male  MRN: [id number] Visit Date: 05/01/2024  Today's healthcare provider: Trenton Frock, PA-C   Cc. Change in mental status  Subjective     Pt presents with daughter today, reports insomnia, nightmares, acute mental status changes aside from baseline dementia, concern for UTI. Daughter reports nightmares, asking about ghosts in the house, hearing noises at night.    Pt denies any acute concerns. Denies insomnia, myalgias, fever, urinary symptoms.  Medications: Outpatient Medications Prior to Visit  Medication Sig   amiodarone  (PACERONE ) 200 MG tablet Take 1 tablet (200 mg total) by mouth daily.   amitriptyline  (ELAVIL ) 10 MG tablet Take 1-3 tablets (10-30 mg total) by mouth at bedtime.   apixaban  (ELIQUIS ) 5 MG TABS tablet Take 1 tablet (5 mg total) by mouth 2 (two) times daily.   aspirin  EC 81 MG tablet Take 81 mg by mouth daily. Swallow whole.   atorvastatin  (LIPITOR ) 80 MG tablet Take 1 tablet (80 mg total) by mouth daily.   Continuous Glucose Receiver (FREESTYLE LIBRE 3 READER) DEVI Use with sensor daily in the afternoon.   Continuous Glucose Sensor (FREESTYLE LIBRE 3 PLUS SENSOR) MISC Place 1 sensor on the skin and change every 15 days.   dapagliflozin  propanediol (FARXIGA ) 10 MG TABS tablet Take 1 tablet (10 mg total) by mouth daily.   hydrOXYzine  (ATARAX ) 10 MG tablet Take 1 tablet (10 mg total) by mouth 3 (three) times daily as needed for anxiety or itching.   insulin  glargine, 2 Unit Dial , (TOUJEO  MAX SOLOSTAR) 300 UNIT/ML Solostar Pen Inject 60 Units into the skin daily in the afternoon.   insulin  lispro (HUMALOG  KWIKPEN) 100 UNIT/ML KwikPen Inject 10-20 Units into the skin 3 (three) times daily.   isosorbide  mononitrate (IMDUR ) 120 MG 24 hr tablet Take 1 tablet (120 mg total) by mouth daily.   levocetirizine (XYZAL ) 5 MG tablet Take 1 tablet (5 mg total) by mouth every evening.    levothyroxine  (SYNTHROID ) 125 MCG tablet Take 1 tablet (125 mcg total) by mouth daily before breakfast.   memantine  (NAMENDA ) 10 MG tablet Take 1 tablet (10 mg total) by mouth 2 (two) times daily.   nitroGLYCERIN  (NITROSTAT ) 0.4 MG SL tablet PLACE 1 TABLET UNDER THE TONGUE EVERY 5 MINUTES AS NEEDED FOR CHEST PAIN.   pramipexole  (MIRAPEX ) 1 MG tablet Take 1/2 - 1 tablet by mouth at bedtime   QUEtiapine  (SEROQUEL ) 25 MG tablet Take 1 tablet (25 mg total) by mouth at bedtime.   ranolazine  (RANEXA ) 500 MG 12 hr tablet Take 1 tablet (500 mg total) by mouth 2 (two) times daily.   sertraline  (ZOLOFT ) 100 MG tablet Take 1 tablet (100 mg total) by mouth daily.   venlafaxine  XR (EFFEXOR  XR) 75 MG 24 hr capsule Take 1 capsule (75 mg total) by mouth daily with breakfast.   Facility-Administered Medications Prior to Visit  Medication Dose Route Frequency Provider   regadenoson  (LEXISCAN ) injection SOLN 0.4 mg  0.4 mg Intravenous Once Tobb, Kardie, DO   technetium tetrofosmin  (TC-MYOVIEW ) injection 28.6 millicurie  28.6 millicurie Intravenous Once PRN Tobb, Kardie, DO    Review of Systems  Constitutional:  Negative for fatigue and fever.  Respiratory:  Negative for cough and shortness of breath.   Cardiovascular:  Negative for chest pain, palpitations and leg swelling.  Neurological:  Negative for dizziness and headaches.       Objective  BP (!) 145/62   Pulse 65   Ht 5\' 8"  (1.727 m)   Wt 177 lb 6.4 oz (80.5 kg)   BMI 26.97 kg/m    Physical Exam Constitutional:      General: He is awake.     Appearance: He is well-developed.  HENT:     Head: Normocephalic.  Eyes:     Conjunctiva/sclera: Conjunctivae normal.  Cardiovascular:     Rate and Rhythm: Normal rate and regular rhythm.     Heart sounds: Normal heart sounds.  Pulmonary:     Effort: Pulmonary effort is normal.     Breath sounds: Normal breath sounds.  Abdominal:     Palpations: Abdomen is soft.     Tenderness: There is no  abdominal tenderness. There is no right CVA tenderness or left CVA tenderness.  Skin:    General: Skin is warm.  Neurological:     Mental Status: He is alert and oriented to person, place, and time.  Psychiatric:        Attention and Perception: Attention normal.        Mood and Affect: Mood normal.        Speech: Speech normal.        Behavior: Behavior is cooperative.     Results for orders placed or performed in visit on 05/01/24  POCT urinalysis dipstick  Result Value Ref Range   Color, UA yellow yellow   Clarity, UA clear clear   Glucose, UA >=1,000 (A) negative mg/dL   Bilirubin, UA negative negative   Ketones, POC UA negative negative mg/dL   Spec Grav, UA 1.610 9.604 - 1.025   Blood, UA negative negative   pH, UA 5.0 5.0 - 8.0   Protein Ur, POC negative negative mg/dL   Urobilinogen, UA 0.2 0.2 or 1.0 E.U./dL   Nitrite, UA Negative Negative   Leukocytes, UA Negative Negative    Assessment & Plan    Altered mental status, unspecified altered mental status type -     POCT urinalysis dipstick -     Urine Culture  UA clear, will send for culture regardless. Recommend f/u with neurology.  Return if symptoms worsen or fail to improve.       Trenton Frock, PA-C  [facility] at [facility] [phone] (phone) [phone] (fax)  [facility]

## 2024-05-02 NOTE — Telephone Encounter (Signed)
 Please advise daughter to re-orient and reassure patient, if it is not enough then they can follow up with us  of PCP for additional management. The next step is prescription of antipsychotics to decrease the incidence of the hallucinations.

## 2024-05-03 DIAGNOSIS — H905 Unspecified sensorineural hearing loss: Secondary | ICD-10-CM | POA: Diagnosis not present

## 2024-05-03 LAB — URINE CULTURE
MICRO NUMBER:: 16508445
Result:: NO GROWTH
SPECIMEN QUALITY:: ADEQUATE

## 2024-05-04 ENCOUNTER — Ambulatory Visit: Payer: Self-pay | Admitting: Physician Assistant

## 2024-05-09 ENCOUNTER — Other Ambulatory Visit: Payer: Self-pay | Admitting: Physician Assistant

## 2024-05-09 ENCOUNTER — Other Ambulatory Visit: Payer: Self-pay | Admitting: Family Medicine

## 2024-05-09 ENCOUNTER — Other Ambulatory Visit: Payer: Self-pay | Admitting: Internal Medicine

## 2024-05-09 ENCOUNTER — Other Ambulatory Visit: Payer: Self-pay

## 2024-05-09 DIAGNOSIS — G2581 Restless legs syndrome: Secondary | ICD-10-CM

## 2024-05-09 MED ORDER — PRAMIPEXOLE DIHYDROCHLORIDE 1 MG PO TABS
ORAL_TABLET | ORAL | 0 refills | Status: DC
  Filled 2024-05-17 (×2): qty 30, 30d supply, fill #0
  Filled 2024-06-13: qty 30, 30d supply, fill #1
  Filled 2024-07-19 – 2024-07-24 (×2): qty 30, 30d supply, fill #2

## 2024-05-09 MED ORDER — ATORVASTATIN CALCIUM 80 MG PO TABS
80.0000 mg | ORAL_TABLET | Freq: Every day | ORAL | 0 refills | Status: DC
  Filled 2024-05-17 (×2): qty 30, 30d supply, fill #0
  Filled 2024-06-13: qty 30, 30d supply, fill #1
  Filled 2024-07-19: qty 30, 30d supply, fill #2

## 2024-05-09 MED ORDER — DAPAGLIFLOZIN PROPANEDIOL 10 MG PO TABS
10.0000 mg | ORAL_TABLET | Freq: Every day | ORAL | 3 refills | Status: DC
  Filled 2024-05-17 (×2): qty 30, 30d supply, fill #0
  Filled 2024-06-13: qty 30, 30d supply, fill #1
  Filled 2024-07-19: qty 30, 30d supply, fill #2

## 2024-05-09 MED ORDER — SERTRALINE HCL 100 MG PO TABS
100.0000 mg | ORAL_TABLET | Freq: Every day | ORAL | 0 refills | Status: DC
  Filled 2024-05-17 (×2): qty 30, 30d supply, fill #0
  Filled 2024-06-13: qty 30, 30d supply, fill #1
  Filled 2024-07-19: qty 30, 30d supply, fill #2

## 2024-05-10 ENCOUNTER — Other Ambulatory Visit (HOSPITAL_COMMUNITY): Payer: Self-pay

## 2024-05-10 ENCOUNTER — Other Ambulatory Visit: Payer: Self-pay

## 2024-05-10 MED ORDER — AMIODARONE HCL 200 MG PO TABS
200.0000 mg | ORAL_TABLET | Freq: Every day | ORAL | 0 refills | Status: DC
  Filled 2024-05-17 (×2): qty 30, 30d supply, fill #0
  Filled 2024-06-13: qty 30, 30d supply, fill #1
  Filled 2024-07-19: qty 30, 30d supply, fill #2
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
## 2024-05-16 ENCOUNTER — Ambulatory Visit: Attending: Cardiovascular Disease | Admitting: Cardiology

## 2024-05-16 ENCOUNTER — Encounter: Payer: Self-pay | Admitting: Cardiology

## 2024-05-16 VITALS — BP 124/60 | HR 65 | Ht 68.0 in | Wt 176.6 lb

## 2024-05-16 DIAGNOSIS — I480 Paroxysmal atrial fibrillation: Secondary | ICD-10-CM

## 2024-05-16 DIAGNOSIS — E118 Type 2 diabetes mellitus with unspecified complications: Secondary | ICD-10-CM | POA: Diagnosis not present

## 2024-05-16 DIAGNOSIS — N189 Chronic kidney disease, unspecified: Secondary | ICD-10-CM | POA: Diagnosis not present

## 2024-05-16 NOTE — Patient Instructions (Signed)
 Medication Instructions:  Your physician recommends that you continue on your current medications as directed. Please refer to the Current Medication list given to you today.  *If you need a refill on your cardiac medications before your next appointment, please call your pharmacy*  Lab Work: NONE If you have labs (blood work) drawn today and your tests are completely normal, you will receive your results only by: MyChart Message (if you have MyChart) OR A paper copy in the mail If you have any lab test that is abnormal or we need to change your treatment, we will call you to review the results.  Testing/Procedures: NONE  Follow-Up: At [facility], you and your health needs are our priority.  As part of our continuing mission to provide you with exceptional heart care, our providers are all part of one team.  This team includes your primary Cardiologist (physician) and Advanced Practice Providers or APPs (Physician Assistants and Nurse Practitioners) who all work together to provide you with the care you need, when you need it.  Your next appointment:   1 year(s)  Provider:   Lavonne Prairie, MD  We recommend signing up for the patient portal called MyChart.  Sign up information is provided on this After Visit Summary.  MyChart is used to connect with patients for Virtual Visits (Telemedicine).  Patients are able to view lab/test results, encounter notes, upcoming appointments, etc.  Non-urgent messages can be sent to your provider as well.   To learn more about what you can do with MyChart, go to [email or web address].

## 2024-05-17 ENCOUNTER — Other Ambulatory Visit (HOSPITAL_COMMUNITY): Payer: Self-pay

## 2024-05-17 ENCOUNTER — Other Ambulatory Visit: Payer: Self-pay

## 2024-05-18 ENCOUNTER — Other Ambulatory Visit: Payer: Self-pay

## 2024-05-28 ENCOUNTER — Other Ambulatory Visit: Payer: Self-pay

## 2024-05-30 ENCOUNTER — Encounter: Payer: Self-pay | Admitting: Family Medicine

## 2024-05-30 DIAGNOSIS — G4733 Obstructive sleep apnea (adult) (pediatric): Secondary | ICD-10-CM

## 2024-06-13 ENCOUNTER — Other Ambulatory Visit: Payer: Self-pay

## 2024-06-13 ENCOUNTER — Other Ambulatory Visit: Payer: Self-pay | Admitting: Family Medicine

## 2024-06-13 MED ORDER — RANOLAZINE ER 500 MG PO TB12
500.0000 mg | ORAL_TABLET | Freq: Two times a day (BID) | ORAL | 0 refills | Status: DC
  Filled 2024-06-13: qty 60, 30d supply, fill #0
  Filled 2024-07-19: qty 60, 30d supply, fill #1
  Filled 2024-08-21 – 2024-08-28 (×3): qty 60, 30d supply, fill #2

## 2024-06-13 MED ORDER — ISOSORBIDE MONONITRATE ER 120 MG PO TB24
120.0000 mg | ORAL_TABLET | Freq: Every day | ORAL | 0 refills | Status: DC
  Filled 2024-06-13: qty 30, 30d supply, fill #0
  Filled 2024-07-19: qty 30, 30d supply, fill #1
  Filled 2024-08-21 – 2024-08-28 (×3): qty 30, 30d supply, fill #2

## 2024-06-20 ENCOUNTER — Other Ambulatory Visit (HOSPITAL_COMMUNITY): Payer: Self-pay

## 2024-06-20 ENCOUNTER — Other Ambulatory Visit: Payer: Self-pay | Admitting: Family Medicine

## 2024-06-20 ENCOUNTER — Other Ambulatory Visit: Payer: Self-pay

## 2024-06-20 MED ORDER — NITROGLYCERIN 0.4 MG SL SUBL
0.4000 mg | SUBLINGUAL_TABLET | SUBLINGUAL | 1 refills | Status: DC | PRN
  Filled 2024-06-20: qty 25, 9d supply, fill #0
  Filled 2024-08-06: qty 25, 9d supply, fill #1

## 2024-06-28 ENCOUNTER — Other Ambulatory Visit: Payer: Self-pay

## 2024-07-02 ENCOUNTER — Other Ambulatory Visit (HOSPITAL_COMMUNITY): Payer: Self-pay

## 2024-07-02 ENCOUNTER — Other Ambulatory Visit: Payer: Self-pay

## 2024-07-02 ENCOUNTER — Other Ambulatory Visit: Payer: Self-pay | Admitting: Physician Assistant

## 2024-07-02 ENCOUNTER — Other Ambulatory Visit: Payer: Self-pay | Admitting: Family Medicine

## 2024-07-02 DIAGNOSIS — G4700 Insomnia, unspecified: Secondary | ICD-10-CM

## 2024-07-02 DIAGNOSIS — I4821 Permanent atrial fibrillation: Secondary | ICD-10-CM

## 2024-07-02 DIAGNOSIS — F32A Depression, unspecified: Secondary | ICD-10-CM

## 2024-07-02 DIAGNOSIS — L308 Other specified dermatitis: Secondary | ICD-10-CM

## 2024-07-02 MED ORDER — APIXABAN 5 MG PO TABS
5.0000 mg | ORAL_TABLET | Freq: Two times a day (BID) | ORAL | 5 refills | Status: AC
  Filled 2024-07-19: qty 60, 30d supply, fill #0
  Filled 2024-08-21 – 2024-08-28 (×3): qty 60, 30d supply, fill #1
  Filled 2024-09-17 – 2024-09-24 (×2): qty 60, 30d supply, fill #2
  Filled 2024-10-23: qty 60, 30d supply, fill #3
  Filled 2024-11-20: qty 60, 30d supply, fill #4
  Filled 2024-12-23 – 2024-12-25 (×4): qty 60, 30d supply, fill #5

## 2024-07-02 MED ORDER — LEVOCETIRIZINE DIHYDROCHLORIDE 5 MG PO TABS
5.0000 mg | ORAL_TABLET | Freq: Every evening | ORAL | 0 refills | Status: DC
Start: 2024-07-02 — End: 2024-10-13
  Filled 2024-07-19: qty 30, 30d supply, fill #0
  Filled 2024-08-21 – 2024-08-28 (×3): qty 30, 30d supply, fill #1
  Filled 2024-09-17 – 2024-09-24 (×2): qty 30, 30d supply, fill #2

## 2024-07-02 MED ORDER — LEVOTHYROXINE SODIUM 125 MCG PO TABS
125.0000 ug | ORAL_TABLET | Freq: Every day | ORAL | 0 refills | Status: DC
Start: 2024-07-02 — End: 2024-07-19
  Filled 2024-07-19: qty 30, 30d supply, fill #0

## 2024-07-02 MED ORDER — AMITRIPTYLINE HCL 10 MG PO TABS
10.0000 mg | ORAL_TABLET | Freq: Every day | ORAL | 0 refills | Status: DC
  Filled 2024-07-19 – 2024-07-24 (×2): qty 90, 30d supply, fill #0
  Filled 2024-08-15 – 2024-08-20 (×3): qty 90, 30d supply, fill #1
  Filled 2024-09-17 (×3): qty 90, 30d supply, fill #2

## 2024-07-02 NOTE — Telephone Encounter (Signed)
 Prescription refill request for Eliquis  received. Indication:afib Last office visit:6/25 Scr:1.40  3/25 Age: 78 Weight:80.1  kg  Prescription refilled

## 2024-07-05 ENCOUNTER — Other Ambulatory Visit (HOSPITAL_COMMUNITY): Payer: Self-pay

## 2024-07-06 ENCOUNTER — Other Ambulatory Visit: Payer: Self-pay

## 2024-07-06 ENCOUNTER — Other Ambulatory Visit (HOSPITAL_COMMUNITY): Payer: Self-pay

## 2024-07-13 ENCOUNTER — Other Ambulatory Visit: Payer: Self-pay

## 2024-07-13 ENCOUNTER — Other Ambulatory Visit (HOSPITAL_COMMUNITY): Payer: Self-pay

## 2024-07-15 ENCOUNTER — Encounter (HOSPITAL_COMMUNITY): Payer: Self-pay

## 2024-07-16 ENCOUNTER — Other Ambulatory Visit (HOSPITAL_COMMUNITY): Payer: Self-pay

## 2024-07-19 ENCOUNTER — Encounter: Payer: Self-pay | Admitting: Internal Medicine

## 2024-07-19 ENCOUNTER — Other Ambulatory Visit (HOSPITAL_COMMUNITY): Payer: Self-pay

## 2024-07-19 ENCOUNTER — Ambulatory Visit: Admitting: Internal Medicine

## 2024-07-19 ENCOUNTER — Other Ambulatory Visit: Payer: Self-pay

## 2024-07-19 VITALS — BP 124/80 | Ht 68.0 in | Wt 186.0 lb

## 2024-07-19 DIAGNOSIS — E1122 Type 2 diabetes mellitus with diabetic chronic kidney disease: Secondary | ICD-10-CM | POA: Diagnosis not present

## 2024-07-19 DIAGNOSIS — E1159 Type 2 diabetes mellitus with other circulatory complications: Secondary | ICD-10-CM

## 2024-07-19 DIAGNOSIS — Z7984 Long term (current) use of oral hypoglycemic drugs: Secondary | ICD-10-CM

## 2024-07-19 DIAGNOSIS — N1831 Chronic kidney disease, stage 3a: Secondary | ICD-10-CM

## 2024-07-19 DIAGNOSIS — E039 Hypothyroidism, unspecified: Secondary | ICD-10-CM | POA: Diagnosis not present

## 2024-07-19 DIAGNOSIS — Z794 Long term (current) use of insulin: Secondary | ICD-10-CM

## 2024-07-19 DIAGNOSIS — E1142 Type 2 diabetes mellitus with diabetic polyneuropathy: Secondary | ICD-10-CM

## 2024-07-19 LAB — POCT GLYCOSYLATED HEMOGLOBIN (HGB A1C): Hemoglobin A1C: 7.1 % — AB (ref 4.0–5.6)

## 2024-07-19 MED ORDER — INSULIN LISPRO (1 UNIT DIAL) 100 UNIT/ML (KWIKPEN)
10.0000 [IU] | PEN_INJECTOR | Freq: Three times a day (TID) | SUBCUTANEOUS | 3 refills | Status: AC
  Filled 2024-07-19: qty 18, 30d supply, fill #0
  Filled 2024-10-13: qty 60, 100d supply, fill #0

## 2024-07-19 MED ORDER — DAPAGLIFLOZIN PROPANEDIOL 10 MG PO TABS
10.0000 mg | ORAL_TABLET | Freq: Every day | ORAL | 3 refills | Status: AC
Start: 2024-07-19 — End: ?
  Filled 2024-07-19: qty 30, 30d supply, fill #0
  Filled 2024-08-21 – 2024-08-28 (×3): qty 30, 30d supply, fill #1
  Filled 2024-09-24: qty 30, 30d supply, fill #2
  Filled 2024-10-23: qty 30, 30d supply, fill #3
  Filled 2024-11-20: qty 30, 30d supply, fill #4
  Filled 2024-12-24: qty 30, 30d supply, fill #5

## 2024-07-19 MED ORDER — TOUJEO MAX SOLOSTAR 300 UNIT/ML ~~LOC~~ SOPN
56.0000 [IU] | PEN_INJECTOR | Freq: Every day | SUBCUTANEOUS | 3 refills | Status: AC
  Filled 2024-07-19: qty 6, 32d supply, fill #0
  Filled 2024-08-20: qty 6, 32d supply, fill #1
  Filled 2024-09-17 (×2): qty 6, 32d supply, fill #2
  Filled 2024-10-19 (×2): qty 6, 32d supply, fill #3
  Filled 2024-11-19 (×2): qty 6, 32d supply, fill #4
  Filled 2024-12-18 – 2024-12-21 (×2): qty 6, 32d supply, fill #5

## 2024-07-19 MED ORDER — LEVOTHYROXINE SODIUM 125 MCG PO TABS
125.0000 ug | ORAL_TABLET | Freq: Every day | ORAL | 3 refills | Status: DC
  Filled 2024-07-19: qty 30, 30d supply, fill #0
  Filled 2024-08-21 – 2024-08-28 (×3): qty 30, 30d supply, fill #1
  Filled 2024-09-24: qty 30, 30d supply, fill #2
  Filled 2024-10-23: qty 30, 30d supply, fill #3
  Filled 2024-11-20: qty 30, 30d supply, fill #4

## 2024-07-19 MED ORDER — FREESTYLE LIBRE 3 PLUS SENSOR MISC: Status: AC

## 2024-07-19 NOTE — Patient Instructions (Addendum)
-   Continue Farxiga  10 mg , 1 tablet every morning  - Decrease Toujeo  56 units ONCE daily  - Continue Humalog  10 units with each meal ( breakfast, lunch and supper) -Humalog  correctional insulin : ADD extra units on insulin  to your meal-time Humalog  dose if your blood sugars are higher than 150. Use the scale below to help guide you:   Blood sugar before meal Number of units to inject  Less than 155 0 unit  156 - 180 1 units  181 -  205 2 units  206 -  230 3 units  231 -  255 4 units  256 -  280 5 units  281 -  305 6 units  306 -  330 7 units  331 -  355 8 units  356 - 380 9 units    HOW TO TREAT LOW BLOOD SUGARS (Blood sugar LESS THAN 70 MG/DL) Please follow the RULE OF 15 for the treatment of hypoglycemia treatment (when your (blood sugars are less than 70 mg/dL)   STEP 1: Take 15 grams of carbohydrates when your blood sugar is low, which includes:  3-4 GLUCOSE TABS  OR 3-4 OZ OF JUICE OR REGULAR SODA OR ONE TUBE OF GLUCOSE GEL    STEP 2: RECHECK blood sugar in 15 MINUTES STEP 3: If your blood sugar is still low at the 15 minute recheck --> then, go back to STEP 1 and treat AGAIN with another 15 grams of carbohydrates.

## 2024-07-19 NOTE — Progress Notes (Signed)
 Name: [name] [name]  Age/ Sex: 78 y.o., male   MRN/ DOB: [id number], 11-02-1946     PCP: Domenica Harlene DELENA, MD   Reason for Endocrinology Evaluation: Type 2 Diabetes Mellitus  Initial Endocrine Consultative Visit: 11/21/2017    PATIENT IDENTIFIER: [name] [name] is a 78 y.o. male with a past medical history of T2DM, HTN, CAD,Hypothyroidism CHF and OSA. The patient has followed with Endocrinology clinic since 11/21/2017 for consultative assistance with management of his diabetes.   On his initia visit with me 04/2022 he was on NPH only with an A1c 8.7 % . He declined SGLT-2i and opted for prandial insulin  . We restarted toujeo  and stopped NPH  DIABETIC HISTORY:  Mr. Atienza was diagnosed with DM in 1989.He was on Metformin  at some point.  His hemoglobin A1c has ranged from 6.4% in 2022, peaking at 8.7% in 2023.   SUBJECTIVE:   During the last visit (03/15/2024): A1c 8.0 %     Today (07/19/2024): [name] is here for follow-up on diabetes management. He is accompanied by his daughter today .  He checks his blood sugars multiple  times daily through CGM. The patient has had hypoglycemic episodes since the last clinic visit, patient is symptomatic with these episodes.  He follows with cardiology for CAD, A-fib and dyslipidemia He also follows with neurology for mild cognitive impairment, mild dementia and gait instability Per daughter, memory is worsening  He was seen by podiatry 09/15/2023 Denies nausea, vomiting Denies constipation or diarrhea   HOME DIABETES REGIMEN:  Farxiga  10 mg daily Toujeo  60 units daily  Humalog  10 units TID QAC Correction Factor : HUmalog  (BG-130/25)     Statin: yes ACE-I/ARB: no      CONTINUOUS GLUCOSE MONITORING RECORD INTERPRETATION      Dates of Recording: 8/1-8/14/2025  Sensor description: [employer] 2  Results statistics:   CGM use % of time 96  Average and SD 179/36.5  Time in range 53 %  % Time Above 180 30   % Time above 250 16  % Time Below target 1   Glycemic patterns summary: BGs trend down overnight and fluctuate during the day Hypoglycemic episodes occurred at night and during the day  Overnight periods: Trends down       DIABETIC COMPLICATIONS: Microvascular complications:  Neuropathy, CKD III Denies:  Last Eye Exam: Completed 10/22/2022  Macrovascular complications:  CAD, PVD Denies: CVA   HISTORY:  Past Medical History:  Past Medical History:  Diagnosis Date   Acquired atrophy of thyroid  06/05/2013   Overview:  July 2014: controlled on synthroid  since 2012 May 2015: decreased to Synthroid  50 mcg  Last Assessment & Plan:  Pt doing well with most recent TSH within normal range.  Will continue current dosage of Synthroid  50mcg. Pt reminded to take medication on empty stomach. Will continue to monitor with periodic laboratory assessment in 3 months.    Anemia    hemoglobin 7.4, iron deficiency, January, 2011, 2 unit transfusion, endoscopy normal, capsule endoscopy February, 2011 no small bowel abnormalities.   Most likely source gastric erosions, followed by GI   Anxiety    Bradycardia    CAD (coronary artery disease)    A. CABG in 2000,status post cardiac cath in 2006, 2009 ....continued chest pain and SOB despite oral medication adjestments including Ranexa . B. Cath November 2009/ mRCA - 2.75 x 23 Abbott Xience V drug-eluting stent ...11/26/2008 to distal  RCA leading to acute marginal.  C. Cath 07/2012  for CP - stable anatomy, med rx. d. cath 2015 and 05/30/2015 stable anatomy, consider Myoview  if has CP again   Carotid artery disease (HCC) 08/01/2015   Doppler, May 29, 2015, 1-39% bilateral ICA    Cerebral ischemia    MRI November, 2010, chronic microvascular ischemia   CKD (chronic kidney disease), stage III (HCC)    Concussion    Depression    Bipolar   Diabetes mellitus (HCC)    Edema    Essential hypertension 06/05/2013   Overview:  July 2014: Controlled  with Bystolic  Sep 2015: Changed to atenolol.  Last Assessment & Plan:  Will change to atenolol for cost and follow Improved.  Medication compliance strongly encouraged BP: 116/64 mmHg    Overview:  July 2014: Controlled with Bystolic  Sep 2015: Changed to atenolol.  Last Assessment & Plan:  Will change to atenolol for cost and follow   Falling episodes    these have occurred in the past and again recurring 2011   Family history of adverse reaction to anesthesia    mother died during bypass surgery but not sure if it has to do with anesthesia   Gastric ulcer    GERD (gastroesophageal reflux disease)    H/O medication noncompliance    Due to loss of insurance   H/O multiple concussions    Hard of hearing    Heart murmur    History of blood transfusion 12/20/2013   History of kidney stones    Hx of CABG    2000,  / one median sternotomy suture broken her chest x-ray November, 2010, no clinical significance   Hyperlipidemia    Hypertension    pt. denies   Iron deficiency anemia    Long term (current) use of anticoagulants [Z79.01] 07/20/2016   Low back pain 06/12/2009   Qualifier: Diagnosis of  By: Georgian ROSALEA CHARM Lamar    Nephrolithiasis    Orthostasis    OSA (obstructive sleep apnea)    PAF (paroxysmal atrial fibrillation) (HCC)    a. dx 2017, started on amiodarone /warfarin but patient intermittently noncompliant.   PSVT (paroxysmal supraventricular tachycardia) (HCC)    RBBB 07/09/2009   Qualifier: Diagnosis of  By: Micky, MD, [facility], Reyes Lenis    RLS (restless legs syndrome) 09/19/2009   Qualifier: Diagnosis of  By: Georgian ROSALEA CHARM Lamar   Overview:  July 2014: Controlled with Mirapex   Last Assessment & Plan:  Patient is doing well. Will continue current management and follow clinically.   Seizure disorder (HCC) 01/09/2017   Spondylosis    C5-6, C6-7 MRI 2010   Syncope 03/2016   TBI (traumatic brain injury) (HCC) 2019   Thyroid  disease    Tubulovillous adenoma of colon 2007    Vitamin D  deficiency 05/17/2017   Wears glasses    Past Surgical History:  Past Surgical History:  Procedure Laterality Date   ANTERIOR CERVICAL DECOMP/DISCECTOMY FUSION N/A 05/31/2016   Procedure: ANTERIOR CERVICAL DECOMPRESSION/DISCECTOMY FUSION CERVICAL FIVE-SIX,CERVICAL SIX-SEVEN;  Surgeon: Victory Gunnels, MD;  Location: [facility]O [facility];  Service: Neurosurgery;  Laterality: N/A;   CARDIAC CATHETERIZATION N/A 05/30/2015   Procedure: Left Heart Cath and Coronary Angiography;  Surgeon: Lenis LELON Clay, MD;  Location: [facility] INVASIVE CV LAB;  Service: Cardiovascular;  Laterality: N/A;   CATARACT EXTRACTION     COLONOSCOPY     CORONARY ARTERY BYPASS GRAFT     2000   ESOPHAGOGASTRODUODENOSCOPY     LEFT HEART CATH AND CORS/GRAFTS ANGIOGRAPHY N/A 12/15/2017   Procedure: LEFT  HEART CATH AND CORS/GRAFTS ANGIOGRAPHY;  Surgeon: Dann Candyce RAMAN, MD;  Location: [facility] INVASIVE CV LAB;  Service: Cardiovascular;  Laterality: N/A;   LEFT HEART CATHETERIZATION WITH CORONARY/GRAFT ANGIOGRAM N/A 08/01/2012   Procedure: LEFT HEART CATHETERIZATION WITH EL BILE;  Surgeon: Debby JONETTA Como, MD;  Location: [facility] CATH LAB;  Service: Cardiovascular;  Laterality: N/A;   LEFT HEART CATHETERIZATION WITH CORONARY/GRAFT ANGIOGRAM N/A 01/03/2015   Procedure: LEFT HEART CATHETERIZATION WITH EL BILE;  Surgeon: Dorn JINNY Lesches, MD;  Location: [facility] CATH LAB;  Service: Cardiovascular;  Laterality: N/A;   NASAL SEPTUM SURGERY     UP3   PERCUTANEOUS CORONARY STENT INTERVENTION (PCI-S)  10/06/2008   mRCA PCI  2.75 x 23 Abbott Xience V drug-eluting stent    RIGHT/LEFT HEART CATH AND CORONARY/GRAFT ANGIOGRAPHY N/A 01/07/2020   Procedure: RIGHT/LEFT HEART CATH AND CORONARY/GRAFT ANGIOGRAPHY;  Surgeon: Claudene Victory ORN, MD;  Location: [facility] INVASIVE CV LAB;  Service: Cardiovascular;  Laterality: N/A;   TOTAL KNEE ARTHROPLASTY Right 09/22/2021   Procedure: RIGHT TOTAL KNEE ARTHROPLASTY;  Surgeon: Sheril Coy,  MD;  Location: [facility] ORS;  Service: Orthopedics;  Laterality: Right;   ULTRASOUND GUIDANCE FOR VASCULAR ACCESS  12/15/2017   Procedure: Ultrasound Guidance For Vascular Access;  Surgeon: Dann Candyce RAMAN, MD;  Location: [facility] INVASIVE CV LAB;  Service: Cardiovascular;;   Social History:  reports that he has never smoked. He has never been exposed to tobacco smoke. He has never used smokeless tobacco. He reports that he does not drink alcohol and does not use drugs. Family History:  Family History  Problem Relation Age of Onset   Pancreatic cancer Brother    Diabetes Brother    Coronary artery disease Brother    Stroke Brother    Diabetes Brother    Diabetes Mother    Heart failure Mother    Heart failure Father    Hypothyroidism Brother    Coronary artery disease Brother    Other Brother        colon surgery   Heart attack Other        Nephew   Irregular heart beat Daughter    Cancer Maternal Grandmother        unknown    Colon cancer Neg Hx    Stomach cancer Neg Hx    Liver cancer Neg Hx    Rectal cancer Neg Hx    Esophageal cancer Neg Hx      HOME MEDICATIONS: Allergies as of 07/19/2024       Reactions   Morphine Other (See Comments)   Hallucinations   Shellfish-derived Products Itching        Medication List        Accurate as of July 19, 2024  8:46 AM. If you have any questions, ask your nurse or doctor.          amiodarone  200 MG tablet Commonly known as: PACERONE  Take 1 tablet (200 mg total) by mouth daily.   amitriptyline  10 MG tablet Commonly known as: ELAVIL  Take 1-3 tablets (10-30 mg total) by mouth at bedtime.   apixaban  5 MG Tabs tablet Commonly known as: ELIQUIS  Take 1 tablet (5 mg total) by mouth 2 (two) times daily.   aspirin  EC 81 MG tablet Take 81 mg by mouth daily. Swallow whole.   atorvastatin  80 MG tablet Commonly known as: LIPITOR  Take 1 tablet (80 mg total) by mouth daily.   Farxiga  10 MG Tabs tablet Generic drug:  dapagliflozin  propanediol Take 1 tablet (10  mg total) by mouth daily.   FreeStyle Libre 3 Plus Sensor Misc Place 1 sensor on the skin and change every 15 days.   FreeStyle Libre 3 Reader [place] Use with sensor daily in the afternoon.   hydrOXYzine  10 MG tablet Commonly known as: ATARAX  Take 1 tablet (10 mg total) by mouth 3 (three) times daily as needed for anxiety or itching.   insulin  lispro 100 UNIT/ML KwikPen Commonly known as: HumaLOG  KwikPen Inject 10-20 Units into the skin 3 (three) times daily.   isosorbide  mononitrate 120 MG 24 hr tablet Commonly known as: IMDUR  Take 1 tablet (120 mg total) by mouth daily.   levocetirizine 5 MG tablet Commonly known as: XYZAL  Take 1 tablet (5 mg total) by mouth every evening.   levothyroxine  125 MCG tablet Commonly known as: SYNTHROID  Take 1 tablet (125 mcg total) by mouth daily before breakfast.   memantine  10 MG tablet Commonly known as: Namenda  Take 1 tablet (10 mg total) by mouth 2 (two) times daily.   nitroGLYCERIN  0.4 MG SL tablet Commonly known as: NITROSTAT  Place 1 tablet (0.4 mg total) under the tongue every 5 (five) minutes x 3 doses as needed for chest pain.   pramipexole  1 MG tablet Commonly known as: Mirapex  Take 1/2 - 1 tablet by mouth at bedtime   QUEtiapine  25 MG tablet Commonly known as: SEROquel  Take 1 tablet (25 mg total) by mouth at bedtime.   ranolazine  500 MG 12 hr tablet Commonly known as: RANEXA  Take 1 tablet (500 mg total) by mouth 2 (two) times daily.   sertraline  100 MG tablet Commonly known as: ZOLOFT  Take 1 tablet (100 mg total) by mouth daily.   Toujeo  Max SoloStar 300 UNIT/ML Solostar Pen Generic drug: insulin  glargine (2 Unit Dial ) Inject 60 Units into the skin daily in the afternoon.   venlafaxine  XR 75 MG 24 hr capsule Commonly known as: Effexor  XR Take 1 capsule (75 mg total) by mouth daily with breakfast.         OBJECTIVE:   Vital Signs: BP 124/80 (BP Location: Left Arm,  Patient Position: Sitting, Cuff Size: Normal)   Ht 5' 8 (1.727 m)   Wt 186 lb (84.4 kg)   BMI 28.28 kg/m   Wt Readings from Last 3 Encounters:  07/19/24 186 lb (84.4 kg)  05/16/24 176 lb 9.6 oz (80.1 kg)  05/01/24 177 lb 6.4 oz (80.5 kg)     Exam: General: Pt appears well and is in NAD  Lungs: Clear with good BS bilat  Heart: RRR   Abdomen:  soft, nontender  Extremities: No  pretibial edema.   Neuro: MS is good with appropriate affect, pt is alert and Ox3     DM foot exam 07/19/2024 per podiatry  The skin of the feet is intact without sores or ulcerations. The pedal pulses are 1+ on right and 1+ on left. The sensation is absent to a screening 5.07, 10 gram monofilament bilaterally at the toes     DATA REVIEWED:  Lab Results  Component Value Date   HGBA1C 8.0 (H) 02/07/2024   HGBA1C 10.3 (H) 09/01/2023   HGBA1C 10.6 (A) 05/13/2023    Latest Reference Range & Units 02/07/24 15:45  Sodium 135 - 145 mmol/L 135  Potassium 3.5 - 5.1 mmol/L 4.8  Chloride 98 - 111 mmol/L 101  CO2 22 - 32 mmol/L 23  Glucose 70 - 99 mg/dL 821 (H)  Mean Plasma Glucose mg/dL 817.0  BUN 8 - 23 mg/dL 29 (  H)  Creatinine 0.61 - 1.24 mg/dL 8.59 (H)  Calcium  8.9 - 10.3 mg/dL 9.4  Anion gap 5 - 15  11  Alkaline Phosphatase 38 - 126 U/L 94  Albumin 3.5 - 5.0 g/dL 4.1  Lipase 11 - 51 U/L 60 (H)  AST 15 - 41 U/L 33  ALT 0 - 44 U/L 42  Total Protein 6.5 - 8.1 g/dL 7.1  Total Bilirubin 0.0 - 1.2 mg/dL 1.2  GFR, Estimated >39 mL/min 52 (L)    Latest Reference Range & Units 02/08/24 08:36  Total CHOL/HDL Ratio RATIO 2.5  Cholesterol 0 - 200 mg/dL 892  HDL Cholesterol >59 mg/dL 42  LDL (calc) 0 - 99 mg/dL 35  Triglycerides <849 mg/dL 850  VLDL 0 - 40 mg/dL 30    Old records , labs and images have been reviewed.    ASSESSMENT / PLAN / RECOMMENDATIONS:   1) Type 2 Diabetes Mellitus, Optimally  controlled, With neuropathic, CKD III and macrovascular complications - Most recent A1c of 7.1%.  Goal A1c <7.0%.    -His A1c has trended down from  8.0% to 7.% , Jon is a caregiver, barriers to diabetes self-care is memory loss, there will be a caregiver with him as well to help with insulin  intake -He does tend to either forget to take prandial insulin  or at times he will takes it too early or too late -I did briefly discuss with Jon the tandem pump, she will look into this - Patient has been noted with hypoglycemia overnight, will decrease basal insulin  as below   MEDICATIONS: Continue Farxiga  10 mg daily Decrease Toujeo  56 units ONCE daily  Continue Humalog  10 units TIDQAC Continue correction Factor : HUmalog  (BG -130/25) 3 times daily before every meal  EDUCATION / INSTRUCTIONS: BG monitoring instructions: Patient is instructed to check his blood sugars 3 times a day, before meals . Call [place] [facility] if: BG persistently < 70  I reviewed the Rule of 15 for the treatment of hypoglycemia in detail with the patient. Literature supplied.   2) Diabetic complications:  Eye: Does not have known diabetic retinopathy.  Neuro/ Feet: Does  have known diabetic peripheral neuropathy .  Renal: Patient does  have known baseline CKD. He   is not on an ACEI/ARB at present.    3)Hypothyroidism:  -Patient is clinically euthyroid - Will check TFTs on next visit  Medication Continue levothyroxine  125 mcg daily   F/U in 4 months      Signed electronically by: Stefano Redgie Butts, MD  [facility] Endocrinology  [facility] [address] [place], [place] [address] Phone: [phone] FAX: [phone]   CC: Domenica Harlene LABOR, MD [address] [place] [address] Phone: [phone]  Fax: [phone]  Return to Endocrinology clinic as below: Future Appointments  Date Time Provider Department Center  07/24/2024  9:15 AM Neysa Reggy BIRCH, MD [facility] None  02/07/2025 11:45 AM Gregg Lek, MD [facility] None

## 2024-07-20 ENCOUNTER — Other Ambulatory Visit: Payer: Self-pay

## 2024-07-22 NOTE — Progress Notes (Unsigned)
 07/24/24- 77 yoM never smoker for sleep evaluation courtesy of Dr. Domenica with concern of OSA Sleep study lab 08/2016 AHI 14. Autocpap 5-15 cm water  per Everitt Morgan  - download 04/26/2019  Only using > 4h /day x 47% of days, avg 4h 23m per day, autoset 15 and AHI 10.3 > referred back  to sleep medicine  CPAP- Download- Epworth score- Body weight today-   Past Medical History:  Diagnosis Date   Acquired atrophy of thyroid  06/05/2013   Overview:  July 2014: controlled on synthroid  75mcg since 2012 May 2015: decreased to Synthroid  50 mcg  Last Assessment & Plan:  Pt doing well with most recent TSH within normal range.  Will continue current dosage of Synthroid  50mcg. Pt reminded to take medication on empty stomach. Will continue to monitor with periodic laboratory assessment in 3 months.    Anemia    hemoglobin 7.4, iron deficiency, January, 2011, 2 unit transfusion, endoscopy normal, capsule endoscopy February, 2011 no small bowel abnormalities.   Most likely source gastric erosions, followed by GI   Anxiety    Bradycardia    CAD (coronary artery disease)    A. CABG in 2000,status post cardiac cath in 2006, 2009 ....continued chest pain and SOB despite oral medication adjestments including Ranexa . B. Cath November 2009/ mRCA - 2.75 x 23 Abbott Xience V drug-eluting stent ...11/26/2008 to distal  RCA leading to acute marginal.  C. Cath 07/2012 for CP - stable anatomy, med rx. d. cath 2015 and 05/30/2015 stable anatomy, consider Myoview  if has CP again   Carotid artery disease (HCC) 08/01/2015   Doppler, May 29, 2015, 1-39% bilateral ICA    Cerebral ischemia    MRI November, 2010, chronic microvascular ischemia   CKD (chronic kidney disease), stage III (HCC)    Concussion    Depression    Bipolar   Diabetes mellitus (HCC)    Edema    Essential hypertension 06/05/2013   Overview:  July 2014: Controlled with Bystolic  Sep 2015: Changed to atenolol.  Last Assessment & Plan:  Will change to atenolol for  cost and follow Improved.  Medication compliance strongly encouraged BP: 116/64 mmHg    Overview:  July 2014: Controlled with Bystolic  Sep 2015: Changed to atenolol.  Last Assessment & Plan:  Will change to atenolol for cost and follow   Falling episodes    these have occurred in the past and again recurring 2011   Family history of adverse reaction to anesthesia    mother died during bypass surgery but not sure if it has to do with anesthesia   Gastric ulcer    GERD (gastroesophageal reflux disease)    H/O medication noncompliance    Due to loss of insurance   H/O multiple concussions    Hard of hearing    Heart murmur    History of blood transfusion 12/20/2013   History of kidney stones    Hx of CABG    2000,  / one median sternotomy suture broken her chest x-ray November, 2010, no clinical significance   Hyperlipidemia    Hypertension    pt. denies   Iron deficiency anemia    Long term (current) use of anticoagulants [Z79.01] 07/20/2016   Low back pain 06/12/2009   Qualifier: Diagnosis of  By: Georgian ROSALEA CHARM Lamar    Nephrolithiasis    Orthostasis    OSA (obstructive sleep apnea)    PAF (paroxysmal atrial fibrillation) (HCC)    a. dx 2017, started on amiodarone patsi  but patient intermittently noncompliant.   PSVT (paroxysmal supraventricular tachycardia) (HCC)    RBBB 07/09/2009   Qualifier: Diagnosis of  By: Micky, MD, [facility], Reyes Lenis    RLS (restless legs syndrome) 09/19/2009   Qualifier: Diagnosis of  By: Georgian ROSALEA CHARM Lamar   Overview:  July 2014: Controlled with Mirapex   Last Assessment & Plan:  Patient is doing well. Will continue current management and follow clinically.   Seizure disorder (HCC) 01/09/2017   Spondylosis    C5-6, C6-7 MRI 2010   Syncope 03/2016   TBI (traumatic brain injury) (HCC) 2019   Thyroid  disease    Tubulovillous adenoma of colon 2007   Vitamin D  deficiency 05/17/2017   Wears glasses    Prior to Admission medications   Medication Sig  Start Date End Date Taking? Authorizing Provider  amiodarone  (PACERONE ) 200 MG tablet Take 1 tablet (200 mg total) by mouth daily. 05/10/24   Lucien Orren SAILOR, PA-C  amitriptyline  (ELAVIL ) 10 MG tablet Take 1-3 tablets (10-30 mg total) by mouth at bedtime. 07/02/24   Domenica Harlene LABOR, MD  apixaban  (ELIQUIS ) 5 MG TABS tablet Take 1 tablet (5 mg total) by mouth 2 (two) times daily. 07/02/24   Lucien Orren SAILOR, PA-C  aspirin  EC 81 MG tablet Take 81 mg by mouth daily. Swallow whole.    [provider]  atorvastatin  (LIPITOR ) 80 MG tablet Take 1 tablet (80 mg total) by mouth daily. 05/09/24   Domenica Harlene LABOR, MD  Continuous Glucose Sensor (FREESTYLE LIBRE 3 PLUS SENSOR) MISC Change sensor every 15 days. 07/19/24   Shamleffer, Ibtehal Jaralla, MD  dapagliflozin  propanediol (FARXIGA ) 10 MG TABS tablet Take 1 tablet (10 mg total) by mouth daily. 07/19/24   Shamleffer, Ibtehal Jaralla, MD  hydrOXYzine  (ATARAX ) 10 MG tablet Take 1 tablet (10 mg total) by mouth 3 (three) times daily as needed for anxiety or itching. 03/21/24   Domenica Harlene LABOR, MD  insulin  glargine, 2 Unit Dial , (TOUJEO  MAX SOLOSTAR) 300 UNIT/ML Solostar Pen Inject 56 Units into the skin daily in the afternoon. 07/19/24   Shamleffer, Ibtehal Jaralla, MD  insulin  lispro (HUMALOG  KWIKPEN) 100 UNIT/ML KwikPen Inject 10-20 Units into the skin 3 (three) times daily. 07/19/24   Shamleffer, Ibtehal Jaralla, MD  isosorbide  mononitrate (IMDUR ) 120 MG 24 hr tablet Take 1 tablet (120 mg total) by mouth daily. 06/13/24   Domenica Harlene LABOR, MD  levocetirizine (XYZAL ) 5 MG tablet Take 1 tablet (5 mg total) by mouth every evening. 07/02/24   Domenica Harlene LABOR, MD  levothyroxine  (SYNTHROID ) 125 MCG tablet Take 1 tablet (125 mcg total) by mouth daily before breakfast. 07/19/24   Shamleffer, Ibtehal Jaralla, MD  memantine  (NAMENDA ) 10 MG tablet Take 1 tablet (10 mg total) by mouth 2 (two) times daily. 02/01/24 01/26/25  Domenica Harlene LABOR, MD  nitroGLYCERIN  (NITROSTAT ) 0.4 MG SL  tablet Place 1 tablet (0.4 mg total) under the tongue every 5 (five) minutes x 3 doses as needed for chest pain. 06/20/24   Domenica Harlene LABOR, MD  pramipexole  (MIRAPEX ) 1 MG tablet Take 1/2 - 1 tablet by mouth at bedtime 05/09/24   Domenica Harlene LABOR, MD  QUEtiapine  (SEROQUEL ) 25 MG tablet Take 1 tablet (25 mg total) by mouth at bedtime. 04/11/24 11/07/24  Camara, Amadou, MD  ranolazine  (RANEXA ) 500 MG 12 hr tablet Take 1 tablet (500 mg total) by mouth 2 (two) times daily. 06/13/24   Domenica Harlene LABOR, MD  sertraline  (ZOLOFT ) 100 MG tablet Take 1 tablet (  100 mg total) by mouth daily. 05/09/24   Domenica Harlene LABOR, MD  venlafaxine  XR (EFFEXOR  XR) 75 MG 24 hr capsule Take 1 capsule (75 mg total) by mouth daily with breakfast. 12/22/23   Domenica Harlene LABOR, MD

## 2024-07-23 ENCOUNTER — Other Ambulatory Visit: Payer: Self-pay

## 2024-07-24 ENCOUNTER — Encounter: Payer: Self-pay | Admitting: Internal Medicine

## 2024-07-24 ENCOUNTER — Other Ambulatory Visit: Payer: Self-pay

## 2024-07-24 ENCOUNTER — Ambulatory Visit (INDEPENDENT_AMBULATORY_CARE_PROVIDER_SITE_OTHER): Admitting: Internal Medicine

## 2024-07-24 VITALS — BP 167/75 | HR 60 | Temp 97.4°F | Ht 68.0 in | Wt 185.2 lb

## 2024-07-24 DIAGNOSIS — G4733 Obstructive sleep apnea (adult) (pediatric): Secondary | ICD-10-CM | POA: Diagnosis not present

## 2024-07-24 DIAGNOSIS — G4752 REM sleep behavior disorder: Secondary | ICD-10-CM

## 2024-07-24 NOTE — Patient Instructions (Signed)
 Order- schedule Split Night Sleep study   dx OSA, RBD   Contact person for scheduling  Jon Rummer( daughter, power of attorney)                                      Mobile  [phone]

## 2024-07-25 ENCOUNTER — Other Ambulatory Visit: Payer: Self-pay

## 2024-08-06 ENCOUNTER — Other Ambulatory Visit: Payer: Self-pay

## 2024-08-08 ENCOUNTER — Encounter: Payer: Self-pay | Admitting: Internal Medicine

## 2024-08-08 NOTE — Telephone Encounter (Signed)
 Please schedule split night

## 2024-08-15 ENCOUNTER — Other Ambulatory Visit: Payer: Self-pay

## 2024-08-15 ENCOUNTER — Other Ambulatory Visit: Payer: Self-pay | Admitting: Physician Assistant

## 2024-08-15 ENCOUNTER — Other Ambulatory Visit (HOSPITAL_COMMUNITY): Payer: Self-pay

## 2024-08-15 ENCOUNTER — Other Ambulatory Visit: Payer: Self-pay | Admitting: Family Medicine

## 2024-08-15 DIAGNOSIS — G2581 Restless legs syndrome: Secondary | ICD-10-CM

## 2024-08-15 MED ORDER — SERTRALINE HCL 100 MG PO TABS
100.0000 mg | ORAL_TABLET | Freq: Every day | ORAL | 1 refills | Status: AC
  Filled 2024-08-15 – 2024-08-28 (×4): qty 30, 30d supply, fill #0
  Filled 2024-09-24: qty 30, 30d supply, fill #1
  Filled 2024-10-13 – 2024-10-23 (×2): qty 30, 30d supply, fill #2
  Filled 2024-11-20: qty 30, 30d supply, fill #3
  Filled 2024-12-23: qty 30, 30d supply, fill #4

## 2024-08-15 MED ORDER — PRAMIPEXOLE DIHYDROCHLORIDE 1 MG PO TABS
ORAL_TABLET | ORAL | 1 refills | Status: AC
  Filled 2024-08-15: qty 30, 30d supply, fill #0
  Filled 2024-09-17 (×2): qty 30, 30d supply, fill #1
  Filled 2024-10-13: qty 30, 30d supply, fill #2
  Filled 2024-11-26 – 2024-11-27 (×2): qty 30, 30d supply, fill #3
  Filled 2024-12-30: qty 30, 30d supply, fill #4

## 2024-08-15 MED ORDER — ATORVASTATIN CALCIUM 80 MG PO TABS
80.0000 mg | ORAL_TABLET | Freq: Every day | ORAL | 1 refills | Status: AC
  Filled 2024-08-15 – 2024-08-28 (×4): qty 30, 30d supply, fill #0
  Filled 2024-09-17 – 2024-09-24 (×2): qty 30, 30d supply, fill #1
  Filled 2024-10-13 – 2024-10-23 (×2): qty 30, 30d supply, fill #2
  Filled 2024-11-20: qty 30, 30d supply, fill #3
  Filled 2024-12-23: qty 30, 30d supply, fill #4

## 2024-08-15 MED ORDER — VENLAFAXINE HCL ER 75 MG PO CP24
75.0000 mg | ORAL_CAPSULE | Freq: Every day | ORAL | 1 refills | Status: AC
  Filled 2024-08-15 – 2024-08-28 (×4): qty 30, 30d supply, fill #0
  Filled 2024-09-24: qty 30, 30d supply, fill #1
  Filled 2024-10-13 – 2024-10-23 (×2): qty 30, 30d supply, fill #2
  Filled 2024-11-20: qty 30, 30d supply, fill #3
  Filled 2024-12-23: qty 30, 30d supply, fill #4

## 2024-08-16 ENCOUNTER — Other Ambulatory Visit (HOSPITAL_COMMUNITY): Payer: Self-pay

## 2024-08-16 ENCOUNTER — Other Ambulatory Visit: Payer: Self-pay

## 2024-08-20 ENCOUNTER — Other Ambulatory Visit: Payer: Self-pay

## 2024-08-21 ENCOUNTER — Other Ambulatory Visit: Payer: Self-pay

## 2024-08-21 ENCOUNTER — Other Ambulatory Visit: Payer: Self-pay | Admitting: Family Medicine

## 2024-08-22 ENCOUNTER — Other Ambulatory Visit: Payer: Self-pay

## 2024-08-23 ENCOUNTER — Other Ambulatory Visit: Payer: Self-pay

## 2024-08-24 ENCOUNTER — Other Ambulatory Visit (HOSPITAL_COMMUNITY): Payer: Self-pay

## 2024-08-24 ENCOUNTER — Other Ambulatory Visit: Payer: Self-pay | Admitting: Family Medicine

## 2024-08-24 ENCOUNTER — Other Ambulatory Visit: Payer: Self-pay

## 2024-08-24 MED ORDER — AMIODARONE HCL 200 MG PO TABS
200.0000 mg | ORAL_TABLET | Freq: Every day | ORAL | 0 refills | Status: DC
  Filled 2024-08-24 – 2024-08-28 (×2): qty 30, 30d supply, fill #0
  Filled 2024-09-17 – 2024-09-24 (×2): qty 30, 30d supply, fill #1
  Filled 2024-10-13 – 2024-10-23 (×2): qty 30, 30d supply, fill #2

## 2024-08-28 ENCOUNTER — Other Ambulatory Visit: Payer: Self-pay

## 2024-09-04 ENCOUNTER — Other Ambulatory Visit (HOSPITAL_BASED_OUTPATIENT_CLINIC_OR_DEPARTMENT_OTHER): Payer: Self-pay

## 2024-09-17 ENCOUNTER — Other Ambulatory Visit: Payer: Self-pay

## 2024-09-17 ENCOUNTER — Other Ambulatory Visit: Payer: Self-pay | Admitting: Family Medicine

## 2024-09-24 ENCOUNTER — Other Ambulatory Visit (HOSPITAL_COMMUNITY): Payer: Self-pay

## 2024-09-24 ENCOUNTER — Other Ambulatory Visit: Payer: Self-pay

## 2024-09-24 ENCOUNTER — Other Ambulatory Visit: Payer: Self-pay | Admitting: Family Medicine

## 2024-09-24 MED ORDER — ISOSORBIDE MONONITRATE ER 120 MG PO TB24
120.0000 mg | ORAL_TABLET | Freq: Every day | ORAL | 0 refills | Status: DC
  Filled 2024-09-24: qty 30, 30d supply, fill #0
  Filled 2024-10-13 – 2024-10-23 (×2): qty 30, 30d supply, fill #1
  Filled 2024-11-20: qty 30, 30d supply, fill #2

## 2024-09-24 MED ORDER — RANOLAZINE ER 500 MG PO TB12
500.0000 mg | ORAL_TABLET | Freq: Two times a day (BID) | ORAL | 0 refills | Status: DC
  Filled 2024-09-24: qty 60, 30d supply, fill #0
  Filled 2024-10-13 – 2024-10-23 (×2): qty 60, 30d supply, fill #1
  Filled 2024-11-20: qty 60, 30d supply, fill #2

## 2024-09-25 ENCOUNTER — Other Ambulatory Visit: Payer: Self-pay

## 2024-09-26 ENCOUNTER — Ambulatory Visit (HOSPITAL_BASED_OUTPATIENT_CLINIC_OR_DEPARTMENT_OTHER): Attending: Internal Medicine | Admitting: Internal Medicine

## 2024-09-26 ENCOUNTER — Other Ambulatory Visit: Payer: Self-pay

## 2024-09-26 DIAGNOSIS — G4752 REM sleep behavior disorder: Secondary | ICD-10-CM | POA: Insufficient documentation

## 2024-09-26 DIAGNOSIS — G4733 Obstructive sleep apnea (adult) (pediatric): Secondary | ICD-10-CM | POA: Diagnosis present

## 2024-09-28 ENCOUNTER — Other Ambulatory Visit: Payer: Self-pay

## 2024-10-01 ENCOUNTER — Other Ambulatory Visit: Payer: Self-pay

## 2024-10-03 ENCOUNTER — Other Ambulatory Visit: Payer: Self-pay

## 2024-10-06 DIAGNOSIS — G4733 Obstructive sleep apnea (adult) (pediatric): Secondary | ICD-10-CM | POA: Diagnosis not present

## 2024-10-06 NOTE — Procedures (Signed)
 Indications for Polysomnography The patient is a 78 year old Male who is 5' 8 and weighs 175.0 lbs.  His BMI equals 26.8.  A diagnostic polysomnogram was performed to evaluate for -.  After 132.0 minutes of sleep time the patient exhibited sufficient respiratory events qualifying him  for a CPAP trial which was then initiated.  Patient reported taking his medications at 9:30 pm.RanolazineQuetiapineMemantineEliquisLevocetirizine dihydrochlorideAtaraxMirapexElavil Polysomnogram Data A full night polysomnogram was performed recording the standard physiologic parameters including EEG, EOG, EMG, EKG, nasal and oral airflow.  Respiratory parameters of chest and abdominal movements are recorded with Piezo-Crystal motion transducers.   Oxygen saturation was recorded by pulse oximetry.  Sleep Architecture The total recording time of the diagnostic portion of the study was 141.2 minutes.  The total sleep time was 132.0 minutes.  During the diagnostic portion of the study, the patient spent 11.7% of total sleep time in Stage N1, 88.3% in Stage N2, 0.0% in  Stages N3, and 0.0% in REM.   Sleep latency was 1.2 minutes.  REM latency was - minutes.  Sleep Efficiency was 93.5%.  Wake after Sleep Onset time was 8.0 minutes.  At 01:04:03 AM the patient was placed on PAP treatment and was titrated at pressures ranging from 6/2* cm/H20 with supplemental oxygen at - up to 23/19/0** cm/H20 with supplemental oxygen at -.  The total recording time of the treatment portion of the  study was 240.8 minutes.  The total sleep time was 201.0 minutes.  During the treatment portion of the study, the patient spent 9.7% of total sleep time in Stage N1, 66.4% in Stage N2, 0.0% in Stages N3, and 23.9% in REM.   Sleep latency was 2.5 minutes.   REM latency was 60.0 minutes.  Sleep Efficiency was 83.5%.  Wake after Sleep Onset time was 37.0 minutes.  Respiratory Events During the diagnostic portion of the study, the polysomnogram  revealed a presence of 4 obstructive, - central, and - mixed apneas resulting in an Apnea index of 1.8 events per hour.  There were 90 hypopneas (GreaterEqual to3% desaturation and/or arousal)  resulting in an Apnea\Hypopnea Index (AHI GreaterEqual to3% desaturation and/or arousal) of 42.7 events per hour.  There were 57 hypopneas (GreaterEqual to4% desaturation) resulting in an Apnea\Hypopnea Index (AHI GreaterEqual to4% desaturation) of 27.7  events per hour.  There were 9 Respiratory Effort Related Arousals resulting in a RERA index of 4.1 events per hour. The Respiratory Disturbance Index is 46.8 events per hour.  The snore index was 195.5 events per hour.  Mean oxygen saturation was  92.5%.  The lowest oxygen saturation during sleep was 87.0%.  Time spent LessEqual to88% oxygen saturation was  minutes ().  During the treatment portion of the study, the polysomnogram revealed a presence of 5 obstructive, - central, and 4 mixed apneas resulting in an Apnea index of 2.7 events per hour.  There were 80 hypopneas (GreaterEqual to3% desaturation and/or arousal)  resulting in an Apnea\Hypopnea Index (AHI GreaterEqual to3% desaturation and/or arousal) of 26.6 events per hour.  There were 35 hypopneas (GreaterEqual to4% desaturation) resulting in an Apnea\Hypopnea Index (AHI GreaterEqual to4% desaturation) of 13.1  events per hour.  There were 40 Respiratory Effort Related Arousals resulting in a RERA index of 11.9 events per hour. The Respiratory Disturbance Index is 38.5 events per hour.  The snore index was 119.4 events per hour.  Mean oxygen saturation was  94.9%.  The lowest oxygen saturation during sleep was 85.0%.  Time spent LessEqual to88% oxygen  saturation was  minutes ().  Limb Activity During the diagnostic portion of the study, there were 7 limb movements recorded.  Of this total, 7 were classified as PLMs.  Of the PLMs, - were associated with arousals.  The Limb Movement index was 3.2 per hour  while the PLM index was 3.2 per hour.  During the treatment portion of the study, there were 38 limb movements recorded.  Of this total, 26 were classified as PLMs.  Of the PLMs, - were associated with arousals.  The Limb Movement index was 11.3 per hour while the PLM index was 7.8 per hour.  Cardiac Summary During the diagnostic portion of the study, the average pulse rate was 52.1 bpm.  The minimum pulse rate was 49.0 bpm while the maximum pulse rate was 62.0 bpm.  During the treatment portion of the study, the average pulse rate was 51.4 bpm.  The minimum pulse rate was 46.0 bpm while the maximum pulse rate was 67.0 bpm.  Comment :  Diagnosis:  Recommendations:   This study was personally reviewed and electronically signed by: Dr. Reggy Salt Accredited Board Certified in Sleep Medicine Date/Time:

## 2024-10-06 NOTE — Procedures (Signed)
 [facility] [address] [place], [place] [address] Tel: [phone]   Fax: [phone]  Split Night Interpretation  Patient Name:  [name] [name], [name] [name] Date:  09/26/2024 Referring Physician:  REGGY SALT [phone]) %%startinterp%% Indications for Polysomnography The patient is a 78 year old Male who is 5' 8 and weighs 175.0 lbs.  His BMI equals 26.8.  A diagnostic polysomnogram was performed to evaluate for -.  After 132.0 minutes of sleep time the patient exhibited sufficient respiratory events qualifying him for a CPAP trial which was then initiated.    Patient reported taking his medications at 9:30 pm.  Ranolazine   Quetiapine   Memantine   Eliquis   Levocetirizine dihydrochloride   Atarax   Mirapex   Elavil    Polysomnogram Data A full night polysomnogram was performed recording the standard physiologic parameters including EEG, EOG, EMG, EKG, nasal and oral airflow.  Respiratory parameters of chest and abdominal movements are recorded with Piezo-Crystal motion transducers.  Oxygen saturation was recorded by pulse oximetry.    Sleep Architecture The total recording time of the diagnostic portion of the study was 141.2 minutes.  The total sleep time was 132.0 minutes.  During the diagnostic portion of the study, the patient spent 11.7% of total sleep time in Stage N1, 88.3% in Stage N2, 0.0% in Stages N3, and 0.0% in REM.   Sleep latency was 1.2 minutes.  REM latency was - minutes.  Sleep Efficiency was 93.5%.  Wake after Sleep Onset time was 8.0 minutes.   At 01:04:03 AM the patient was placed on PAP treatment and was titrated at pressures ranging from 6/2* cm/H20 with supplemental oxygen at - up to 23/19/0** cm/H20 with supplemental oxygen at -.  The total recording time of the treatment portion of the study was 240.8 minutes.  The total sleep time was 201.0 minutes.  During the treatment portion of the study, the patient spent 9.7% of total  sleep time in Stage N1, 66.4% in Stage N2, 0.0% in Stages N3, and 23.9% in REM.   Sleep latency was 2.5 minutes.  REM latency was 60.0 minutes.  Sleep Efficiency was 83.5%.  Wake after Sleep Onset time was 37.0 minutes.  Respiratory Events During the diagnostic portion of the study, the polysomnogram revealed a presence of 4 obstructive, - central, and - mixed apneas resulting in an Apnea index of 1.8 events per hour.  There were 90 hypopneas (>=3% desaturation and/or arousal) resulting in an Apnea\Hypopnea Index (AHI >=3% desaturation and/or arousal) of 42.7 events per hour.  There were 57 hypopneas (>=4% desaturation) resulting in an Apnea\Hypopnea Index (AHI >=4% desaturation) of 27.7 events per hour.  There were 9 Respiratory Effort Related Arousals resulting in a RERA index of 4.1 events per hour. The Respiratory Disturbance Index is 46.8 events per hour.  The snore index was 195.5 events per hour.  Mean oxygen saturation was 92.5%.  The lowest oxygen saturation during sleep was 87.0%.  Time spent <=88% oxygen saturation was 1.6 minutes (1.1%).  During the treatment portion of the study, the polysomnogram revealed a presence of 5 obstructive, - central, and 4 mixed apneas resulting in an Apnea index of 2.7 events per hour.  There were 80 hypopneas (>=3% desaturation and/or arousal) resulting in an Apnea\Hypopnea Index (AHI >=3% desaturation and/or arousal) of 26.6 events per hour.  There were 35 hypopneas (>=4% desaturation) resulting in an Apnea\Hypopnea Index (AHI >=4% desaturation) of 13.1 events per hour.  There were 40 Respiratory Effort Related Arousals resulting in a RERA  index of 11.9 events per hour. The Respiratory Disturbance Index is 38.5 events per hour.  The snore index was 119.4 events per hour.  Mean oxygen saturation was 94.9%.  The lowest oxygen saturation during sleep was 85.0%.  Time spent <=88% oxygen saturation was 0.3 minutes (0.1%).  Limb Activity During the diagnostic portion of  the study, there were 7 limb movements recorded.  Of this total, 7 were classified as PLMs.  Of the PLMs, - were associated with arousals.  The Limb Movement index was 3.2 per hour while the PLM index was 3.2 per hour.  During the treatment portion of the study, there were 38 limb movements recorded.  Of this total, 26 were classified as PLMs.  Of the PLMs, - were associated with arousals.  The Limb Movement index was 11.3 per hour while the PLM index was 7.8 per hour.  Cardiac Summary During the diagnostic portion of the study, the average pulse rate was 52.1 bpm.  The minimum pulse rate was 49.0 bpm while the maximum pulse rate was 62.0 bpm. Occasional PVCs.  During the treatment portion of the study, the average pulse rate was 51.4 bpm.  The minimum pulse rate was 46.0 bpm while the maximum pulse rate was 67.0 bpm.   Comment : Mild obstructive sleep apnea, AHI(4%) 13/hr. Snoring with oxygen desaturation to a nadir of 85%, mean 94.1%. CPAP titration provided inadequate control at tolerated pressure and was changed to BIPAP 23/19, PS 0 with residual AHI(4%) 0/hr, saturation nadir 94%, mean 95.8%.  Diagnosis: Obstructive sleep apnea  Recommendations: BIPAP 23/ 19, PS 0. Patient used a medium FP Simplus full face mask with heated humidity.   This study was personally reviewed and electronically signed by: Dr. Reggy Salt Accredited Board Certified in Sleep Medicine Date/Time: 10/06/24  1:11    %%endinterp%%  Split Night Report  Patient Name: [name] [name], [name] [name] Study Date: 09/26/2024  Date of Birth: 04/28/1946 Study Type: Split Night  Age: 23 year MRN #: [id number]  Sex: Male Interpreting Physician: SALT REGGY, [id number]  Height: 5' 8 Referring Physician: REGGY SALT [phone])  Weight: 175.0 lbs Recording Tech: Orie Sires RRT RPSGT RST  BMI: 26.8 Scoring Tech: Orie Sires RRT RPSGT RST  ESS: 19 Neck Size: 15.5  Mask Type [employer] Simplus Final Pressure: 23/19 CMH2O  Mask  Size: Medium Supplemental O2: -   Study Overview  DIAGNOSTIC TREATMENT  Lights Off: 10:42:45 PM Lights Off: 01:03:56 AM  Lights On: 01:03:56 AM Lights On: 05:04:42 AM  Time in Bed: 141.2 min. Time in Bed: 240.8 min.  Total Sleep Time: 132.0 min. Total Sleep Time: 201.0 min.  Sleep Efficiency: 93.5% Sleep Efficiency: 83.5%  Sleep Latency: 1.2 min. Sleep Latency: 2.5 min.  REM Latency from Sleep Onset: - min. REM Latency from Sleep Onset: 60.0 min.  Wake After Sleep Onset: 8.0 min. Wake After Sleep Onset: 37.0 min.   DIAGNOSTIC TREATMENT   Count Index  Count Index  Awakenings: 11 5.0 Awakenings: 13 3.9  Arousals: 54 24.5 Arousals: 80 23.9  AHI (>=3% Desat and/or Ar.): 94 42.7 AHI (>=3% Desat and/or Ar.): 89 26.6  AHI (>=4% Desat): 61 27.7 AHI (>=4% Desat): 44 13.1   Limb Movements: 7 3.2 Limb Movements: 38 11.3  Snore: 430 195.5 Snore: 400 119.4  Desaturations: 90 40.9 Desaturations: 81 24.2  Minimum SpO2 TST: 87.0% Minimum SpO2 TST: 85.0%    Sleep Architecture   DIAGNOSTIC TREATMENT ENTIRE NIGHT  Stages Time (mins) % Sleep Time Time (mins) %  Sleep Time Time (mins) % Sleep Time  Wake 9.5  40.0  49.5   Stage N1 15.5 11.7% 19.5 9.7% 35.0 10.5%  Stage N2 116.5 88.3% 133.5 66.4% 250.0 75.1%  Stage N3 0.0 0.0% 0.0 0.0% 0.0 0.0%  REM 0.0 0.0% 48.0 23.9% 48.0 14.4%   Arousal Summary   DIAGNOSTIC TREATMENT   NREM REM TST Index NREM REM TST Index  Respiratory Ar. 36 - 36 16.4 30 31  61 18.2  PLM Ar. - - - - - - - -  Isolated Limb Movement Ar. - - - - 1 2 3  0.9  Snore Ar. 4 - 4 1.8 4 - 4 1.2  Spontaneous Ar. 14 - 14 6.4 13 1 14  4.2  Total Ar. 54 - 54 24.5 47 33 80 23.9    Respiratory Summary  DIAGNOSTIC By Sleep Stage By Body Position Total   NREM REM Supine Non-Supine   Time (min) 132.0 0.0 132.0 - 132.0         Obstructive Apnea 4 - 4 - 4  Mixed Apnea - - - - -  Central Apnea - - - - -  Total Apneas 4 - 4 - 4  Total Apnea Index 1.8 - 1.8 - 1.8         Hypopneas (>=3%  Desat and/or Ar.) 90 - 90 - 90  AHI (>=3% Desat and/or Ar.) 42.7 - 42.7 - 42.7         Hypopneas (>=4% Desat) 57 - 57 - 57  AHI (>=4% Desat) 27.7 - 27.7 - 27.7          RERAs 9 - 9 - 9  RERA Index 4.1 - 4.1 - 4.1         RDI 46.8 - 46.8 - 46.8    TREATMENT By Sleep Stage By Body Position Total   NREM REM Supine Non-Supine   Time (min) 153.0 48.0 201.0 - 201.0         Obstructive Apnea 5 - 5 - 5  Mixed Apnea 3 1 4  - 4  Central Apnea - - - - -  Total Apneas 8 1 9  - 9  Total Apnea Index 3.1 1.3 2.7 - 2.7         Hypopneas (>=3% Desat and/or Ar.) 51 29 80 - 80  AHI (>=3% Desat and/or Ar.) 23.1 37.5 26.6 - 26.6         Hypopneas (>=4% Desat) 20 15 35 - 35  AHI (>=4% Desat) 11.0 20.0 13.1 - 13.1          RERAs 18 22 40 - 40  RERA Index 7.1 27.5 11.9 - 11.9         RDI 30.2 65.0 38.5 - 38.5    Respiratory Event Durations   DIAGNOSTIC TREATMENT  Apnea NREM REM NREM REM  Average (seconds) 23.5 - 28.2 21.7  Maximum (seconds) 31.2 - 37.1 21.7  Hypopnea      Average (seconds) 27.8 - 37.3 29.6  Maximum (seconds) 58.5 - 75.8 60.6    Limb Movement Summary   DIAGNOSTIC TREATMENT   Count Index Count Index  Isolated Limb Movements - - 12 3.6  Periodic Limb Movements (PLMs) 7 3.2 26 7.8  Total Limb Movements 7 3.2 38 11.3    Oxygen Saturation Summary   DIAGNOSTIC TREATMENT   Wake NREM REM TST Wake NREM REM TST  Average SpO2 93.9% 92.4% - 92.4% 95.7% 94.8% 94.7% 94.8%  Minimum SpO2 88.0% 87.0% - 87.0%  89.0% 85.0% 88.0% 85.0%   Maximum SpO2 98.0% 98.0% - 98.0%  100.0% 98.0% 98.0% 98.0%    DIAGNOSTIC Oxygen Saturation Distribution  Range (%) Time in range (min) Time in range (%)   90.0 - 100.0 124.2 87.8%  80.0 - 90.0 17.3 12.2%  70.0 - 80.0 - -  60.0 - 70.0 - -  50.0 - 60.0 - -  0.0 - 50.0 - -  Time Spent <=88% SpO2  Range (%) Time in range (min) Time in range (%)  0.0 - 88.0 1.6 1.1%      Count Index  Desaturations: 90 40.9   TREATMENT Oxygen Saturation  Distribution  Range (%) Time in range (min) Time in range (%)   90.0 - 100.0 226.2 99.3%  80.0 - 90.0 1.7 0.7%  70.0 - 80.0 - -  60.0 - 70.0 - -  50.0 - 60.0 - -  0.0 - 50.0 - -  Time Spent <=88% SpO2  Range (%) Time in range (min) Time in range (%)  0.0 - 88.0 0.3 0.1%      Count Index  Desaturations: 81 24.2     Cardiac Summary   DIAGNOSTIC TREATMENT   Wake NREM REM Total Wake NREM REM Total  Average Pulse Rate (BPM) 53.3 52.0 - 52.1 55.9 51.0 50.2 51.4  Minimum Pulse Rate (BPM) 50.0 49.0 - 49.0 48.0 46.0 46.0 46.0  Maximum Pulse Rate (BPM) 59.0 62.0 - 62.0 67.0 59.0 57.0 67.0   Pulse Rate Distribution   DIAGNOSTIC  Range (bpm) Time in range (min) Time in range (%)  0.0 - 40.0 - -  40.0 - 60.0 140.9 99.6%  60.0 - 80.0 0.1 0.0%  80.0 - 100.0 - -  100.0 - 120.0 - -  120.0 - 140.0 - -  140.0 - 200.0 - -   TREATMENT  Range (bpm) Time in range (min) Time in range (%)  0.0 - 40.0 - -  40.0 - 60.0 220.7 96.0%  60.0 - 80.0 6.8 3.0%  80.0 - 100.0 - -  100.0 - 120.0 - -  120.0 - 140.0 - -  140.0 - 200.0 - -    Titration Summary  PAP Device PAP Level O2 Level Time (min) Wake (min) NREM (min) REM (min) Supine TST (min) Sleep Eff% OA# CA# MA# Hyp# (>=3%) AHI (>=3%) Hyp# (>=4%) AHI (>=%4) RERA RDI SpO2 <=88% (min) Min SpO2 Mean SpO2 Ar. Index  - Off - 141.5 9.5 132.0 0.0  132.0 93.3% 4 - - 90 42.7 57  27.7 9  46.8  1.6 87.0 92.4 24.5  CPAP EPR 6/2 - 25.0 4.5 20.5 0.0  20.5 82.0% 1 - - 13 41.0 5  17.6 1  43.9  0.2 85.0 93.2 11.7  CPAP EPR 8/2 - 14.5 0.0 14.5 0.0  14.5 100.0% - - - 7 29.0 3  12.4 -  29.0  0.0 90.0 93.4 8.3  CPAP EPR 10/2 - 30.0 0.0 23.0 7.0  30.0 100.0% - - 2 11 26.0 7  18.0 2  30.0  0.0 90.0 94.4 18.0  CPAP EPR 12/2 - 25.5 0.0 0.0 25.5  25.5 100.0% - - - 23 54.1 11  25.9 15  89.4  0.0 88.0 94.1 54.1  CPAP EPR 14/2 - 61.0 24.5 24.5 12.0  36.5 59.8% - - - 4 6.6 2  3.3 5  14.8  0.0 92.0 95.3 14.8  CPAP EPR 16/2 - 39.5 6.5 29.5 3.5  33.0 83.5% - - - 9  16.4 2  3.6 10  34.5  0.0 93.0 95.4 32.7  Bilevel 20/16/0 - 20.0 1.5 18.5 0.0  18.5 92.5% 3 - 2 3 25.9 3  25.9 3  35.7  0.0 93.0 95.6 25.9  Bilevel 22/18/0 - 21.0 1.5 19.5 0.0  19.5 92.9% 1 - - 9 30.8 2  9.2 4  43.1  0.0 93.0 95.7 15.4  Bilevel 23/19/0 - 4.5 1.5 3.0 0.0  3.0 66.7% - - - 1 20.0 -  - -  20.0  0.0 94.0 95.8 40.0    Hypnograms                           Technologist Comments  Patient is a 78 year old white male who was sent to the sleep center for OSA and Rem behavior disorder. Patient was placed on CPAP of 6 CMH2O with 2 CMH2O of EPR at 1:04 am and was increased to a Bilevel pressure of 23/19 CMH2O with no audible snoring noted. Patient was increased for respiratory events with and without a 4%  desat or arousal, and audible snoring. Patient tolerated CPAP/ Bilevel trial fairly well. However, tech had difficulty titrating patient due to patient unable to maintain sleep in early am. No oxygen was applied. Patient reported taking his medications at 9:30 pm. Occassional PLM's/PLMA's were noted. Note, Rem behavior disorder montage was used for patient's study, however no Rem Behavior disorder was noted  during the night. Questionable cardiac arrhythmias were noted see epochs for examples: 36, 94, 407, etc. One restroom visit was noted. Study was performed in room # 4. Patient was fitted with a [employer] nasal/oral full face mask size medium with heated humidification.                                Reggy Salt Diplomate, Biomedical Engineer of Sleep Medicine  ELECTRONICALLY SIGNED ON:  10/06/2024, 1:17 PM [facility] PH: [phone]   FX: [phone] ACCREDITED BY THE [employer]ICAN ACAD[employer]Y OF SLEEP MEDICINE

## 2024-10-13 ENCOUNTER — Other Ambulatory Visit: Payer: Self-pay | Admitting: Family Medicine

## 2024-10-13 ENCOUNTER — Other Ambulatory Visit (HOSPITAL_COMMUNITY): Payer: Self-pay

## 2024-10-13 DIAGNOSIS — L308 Other specified dermatitis: Secondary | ICD-10-CM

## 2024-10-13 DIAGNOSIS — G4700 Insomnia, unspecified: Secondary | ICD-10-CM

## 2024-10-13 DIAGNOSIS — F32A Depression, unspecified: Secondary | ICD-10-CM

## 2024-10-15 ENCOUNTER — Encounter: Payer: Self-pay | Admitting: *Deleted

## 2024-10-15 ENCOUNTER — Other Ambulatory Visit: Payer: Self-pay

## 2024-10-15 ENCOUNTER — Other Ambulatory Visit (HOSPITAL_COMMUNITY): Payer: Self-pay

## 2024-10-15 MED ORDER — NITROGLYCERIN 0.4 MG SL SUBL
0.4000 mg | SUBLINGUAL_TABLET | SUBLINGUAL | 0 refills | Status: DC | PRN
  Filled 2024-10-15: qty 25, 7d supply, fill #0

## 2024-10-15 MED ORDER — AMITRIPTYLINE HCL 10 MG PO TABS
10.0000 mg | ORAL_TABLET | Freq: Every day | ORAL | 0 refills | Status: AC
  Filled 2024-10-15 – 2024-10-17 (×2): qty 90, 30d supply, fill #0
  Filled 2024-11-12 – 2024-11-27 (×10): qty 90, 30d supply, fill #1
  Filled 2024-12-20 – 2024-12-21 (×3): qty 90, 30d supply, fill #2

## 2024-10-15 MED ORDER — LEVOCETIRIZINE DIHYDROCHLORIDE 5 MG PO TABS
5.0000 mg | ORAL_TABLET | Freq: Every evening | ORAL | 0 refills | Status: AC
  Filled 2024-10-15: qty 90, 90d supply, fill #0
  Filled 2024-10-23: qty 30, 30d supply, fill #0
  Filled 2024-11-20: qty 30, 30d supply, fill #1
  Filled 2024-12-24: qty 30, 30d supply, fill #2

## 2024-10-16 ENCOUNTER — Other Ambulatory Visit: Payer: Self-pay

## 2024-10-16 ENCOUNTER — Other Ambulatory Visit (HOSPITAL_BASED_OUTPATIENT_CLINIC_OR_DEPARTMENT_OTHER): Payer: Self-pay

## 2024-10-16 ENCOUNTER — Other Ambulatory Visit: Payer: Self-pay | Admitting: Internal Medicine

## 2024-10-16 ENCOUNTER — Other Ambulatory Visit (HOSPITAL_COMMUNITY): Payer: Self-pay

## 2024-10-16 DIAGNOSIS — E1122 Type 2 diabetes mellitus with diabetic chronic kidney disease: Secondary | ICD-10-CM

## 2024-10-16 MED ORDER — FREESTYLE LIBRE 3 PLUS SENSOR MISC
1.0000 | 3 refills | Status: AC
  Filled 2024-10-16: qty 6, 90d supply, fill #0
  Filled 2024-12-30: qty 6, 90d supply, fill #1

## 2024-10-17 ENCOUNTER — Other Ambulatory Visit: Payer: Self-pay

## 2024-10-17 ENCOUNTER — Other Ambulatory Visit (HOSPITAL_COMMUNITY): Payer: Self-pay

## 2024-10-18 ENCOUNTER — Other Ambulatory Visit (HOSPITAL_BASED_OUTPATIENT_CLINIC_OR_DEPARTMENT_OTHER): Payer: Self-pay

## 2024-10-18 MED ORDER — FLUZONE HIGH-DOSE 0.5 ML IM SUSY
0.5000 mL | PREFILLED_SYRINGE | Freq: Once | INTRAMUSCULAR | 0 refills | Status: AC
  Filled 2024-10-18: qty 0.5, 1d supply, fill #0

## 2024-10-18 MED ORDER — COMIRNATY 30 MCG/0.3ML IM SUSY
0.3000 mL | PREFILLED_SYRINGE | Freq: Once | INTRAMUSCULAR | 0 refills | Status: AC
  Filled 2024-10-18: qty 0.3, 1d supply, fill #0

## 2024-10-19 ENCOUNTER — Other Ambulatory Visit (HOSPITAL_BASED_OUTPATIENT_CLINIC_OR_DEPARTMENT_OTHER): Payer: Self-pay

## 2024-10-19 ENCOUNTER — Other Ambulatory Visit (HOSPITAL_COMMUNITY): Payer: Self-pay

## 2024-10-22 NOTE — Progress Notes (Unsigned)
 07/24/24- 77 yoM never smoker for sleep evaluation courtesy of Dr. Domenica with concern of OSA Medical problems include dementia, managed by Neurology, CAD/ CABG. Sleep study lab 08/2016 AHI 14. Autocpap 5-15 cm water  per Everitt Morgan  - download 04/26/2019  Only using > 4h /day x 47% of days, avg 4h 19m per day, autoset 15 and AHI 10.3 > referred back  to sleep medicine  His CPAP broke 10 months ago, unknown DME. He slept better with CPAP. Epworth score-9 Body weight today-185 lbs Discussed the use of AI scribe software for clinical note transcription with the patient, who gave verbal consent to proceed.  History of Present Illness   [name] [name] is a 78 year old male with sleep apnea who presents with difficulty sleeping due to a broken CPAP machine. He is accompanied by daughter [name] [name], with whom he lives, who has medical power of attorney. He is followed by Neurology for dementia.  Sleep apnea was diagnosed in 2017 with a sleep study showing 14 apneic episodes per hour. The CPAP machine, which he used regularly, broke 10 months ago, leading to poor sleep quality and increased daytime sleepiness. He experiences frequent daytime napping and easily falls asleep when sitting quietly. He has not driven in about a year due to concerns about alertness.  He experiences restlessness at night, often tearing up bed covers, and frequently wakes up from dreams. No history of sleep walking . Description suggests REM Behavior Disorder. He consumes minimal caffeine, limited to one cup of coffee at breakfast. No current use of medications to aid sleep.   History of ENT for nasal fracture and UPPP. Hx CABG.       Past Medical History:  Diagnosis Date   Acquired atrophy of thyroid  06/05/2013   Overview:  July 2014: controlled on synthroid  75mcg since 2012 May 2015: decreased to Synthroid  50 mcg  Last Assessment & Plan:  Pt doing well with most recent TSH within normal range.  Will continue current dosage  of Synthroid  50mcg. Pt reminded to take medication on empty stomach. Will continue to monitor with periodic laboratory assessment in 3 months.    Anemia    hemoglobin 7.4, iron deficiency, January, 2011, 2 unit transfusion, endoscopy normal, capsule endoscopy February, 2011 no small bowel abnormalities.   Most likely source gastric erosions, followed by GI   Anxiety    Bradycardia    CAD (coronary artery disease)    A. CABG in 2000,status post cardiac cath in 2006, 2009 ....continued chest pain and SOB despite oral medication adjestments including Ranexa . B. Cath November 2009/ mRCA - 2.75 x 23 Abbott Xience V drug-eluting stent ...11/26/2008 to distal  RCA leading to acute marginal.  C. Cath 07/2012 for CP - stable anatomy, med rx. d. cath 2015 and 05/30/2015 stable anatomy, consider Myoview  if has CP again   Carotid artery disease (HCC) 08/01/2015   Doppler, May 29, 2015, 1-39% bilateral ICA    Cerebral ischemia    MRI November, 2010, chronic microvascular ischemia   CKD (chronic kidney disease), stage III (HCC)    Concussion    Depression    Bipolar   Diabetes mellitus (HCC)    Edema    Essential hypertension 06/05/2013   Overview:  July 2014: Controlled with Bystolic  Sep 2015: Changed to atenolol.  Last Assessment & Plan:  Will change to atenolol for cost and follow Improved.  Medication compliance strongly encouraged BP: 116/64 mmHg    Overview:  July 2014: Controlled with  Bystolic  Sep 2015: Changed to atenolol.  Last Assessment & Plan:  Will change to atenolol for cost and follow   Falling episodes    these have occurred in the past and again recurring 2011   Family history of adverse reaction to anesthesia    mother died during bypass surgery but not sure if it has to do with anesthesia   Gastric ulcer    GERD (gastroesophageal reflux disease)    H/O medication noncompliance    Due to loss of insurance   H/O multiple concussions    Hard of hearing    Heart murmur    History of  blood transfusion 12/20/2013   History of kidney stones    Hx of CABG    2000,  / one median sternotomy suture broken her chest x-ray November, 2010, no clinical significance   Hyperlipidemia    Hypertension    pt. denies   Iron deficiency anemia    Long term (current) use of anticoagulants [Z79.01] 07/20/2016   Low back pain 06/12/2009   Qualifier: Diagnosis of  By: Georgian ROSALEA CHARM Lamar    Nephrolithiasis    Orthostasis    OSA (obstructive sleep apnea)    PAF (paroxysmal atrial fibrillation) (HCC)    a. dx 2017, started on amiodarone /warfarin but patient intermittently noncompliant.   PSVT (paroxysmal supraventricular tachycardia) (HCC)    RBBB 07/09/2009   Qualifier: Diagnosis of  By: Micky, MD, [facility], Reyes Lenis    RLS (restless legs syndrome) 09/19/2009   Qualifier: Diagnosis of  By: Georgian ROSALEA CHARM Lamar   Overview:  July 2014: Controlled with Mirapex   Last Assessment & Plan:  Patient is doing well. Will continue current management and follow clinically.   Seizure disorder (HCC) 01/09/2017   Spondylosis    C5-6, C6-7 MRI 2010   Syncope 03/2016   TBI (traumatic brain injury) (HCC) 2019   Thyroid  disease    Tubulovillous adenoma of colon 2007   Vitamin D  deficiency 05/17/2017   Wears glasses    Prior to Admission medications   Medication Sig Start Date End Date Taking? Authorizing Provider  amiodarone  (PACERONE ) 200 MG tablet Take 1 tablet (200 mg total) by mouth daily. 05/10/24   Lucien Orren SAILOR, PA-C  amitriptyline  (ELAVIL ) 10 MG tablet Take 1-3 tablets (10-30 mg total) by mouth at bedtime. 07/02/24   Domenica Harlene LABOR, MD  apixaban  (ELIQUIS ) 5 MG TABS tablet Take 1 tablet (5 mg total) by mouth 2 (two) times daily. 07/02/24   Lucien Orren SAILOR, PA-C  aspirin  EC 81 MG tablet Take 81 mg by mouth daily. Swallow whole.    [provider]  atorvastatin  (LIPITOR ) 80 MG tablet Take 1 tablet (80 mg total) by mouth daily. 05/09/24   Domenica Harlene LABOR, MD  Continuous Glucose Sensor (FREESTYLE  LIBRE 3 PLUS SENSOR) MISC Change sensor every 15 days. 07/19/24   Shamleffer, Ibtehal Jaralla, MD  dapagliflozin  propanediol (FARXIGA ) 10 MG TABS tablet Take 1 tablet (10 mg total) by mouth daily. 07/19/24   Shamleffer, Ibtehal Jaralla, MD  hydrOXYzine  (ATARAX ) 10 MG tablet Take 1 tablet (10 mg total) by mouth 3 (three) times daily as needed for anxiety or itching. 03/21/24   Domenica Harlene LABOR, MD  insulin  glargine, 2 Unit Dial , (TOUJEO  MAX SOLOSTAR) 300 UNIT/ML Solostar Pen Inject 56 Units into the skin daily in the afternoon. 07/19/24   Shamleffer, Ibtehal Jaralla, MD  insulin  lispro (HUMALOG  KWIKPEN) 100 UNIT/ML KwikPen Inject 10-20 Units into the skin 3 (three)  times daily. 07/19/24   Shamleffer, Ibtehal Jaralla, MD  isosorbide  mononitrate (IMDUR ) 120 MG 24 hr tablet Take 1 tablet (120 mg total) by mouth daily. 06/13/24   Domenica Harlene LABOR, MD  levocetirizine (XYZAL ) 5 MG tablet Take 1 tablet (5 mg total) by mouth every evening. 07/02/24   Domenica Harlene LABOR, MD  levothyroxine  (SYNTHROID ) 125 MCG tablet Take 1 tablet (125 mcg total) by mouth daily before breakfast. 07/19/24   Shamleffer, Ibtehal Jaralla, MD  memantine  (NAMENDA ) 10 MG tablet Take 1 tablet (10 mg total) by mouth 2 (two) times daily. 02/01/24 01/26/25  Domenica Harlene LABOR, MD  nitroGLYCERIN  (NITROSTAT ) 0.4 MG SL tablet Place 1 tablet (0.4 mg total) under the tongue every 5 (five) minutes x 3 doses as needed for chest pain. 06/20/24   Domenica Harlene LABOR, MD  pramipexole  (MIRAPEX ) 1 MG tablet Take 1/2 - 1 tablet by mouth at bedtime 05/09/24   Domenica Harlene LABOR, MD  QUEtiapine  (SEROQUEL ) 25 MG tablet Take 1 tablet (25 mg total) by mouth at bedtime. 04/11/24 11/07/24  Camara, Amadou, MD  ranolazine  (RANEXA ) 500 MG 12 hr tablet Take 1 tablet (500 mg total) by mouth 2 (two) times daily. 06/13/24   Domenica Harlene LABOR, MD  sertraline  (ZOLOFT ) 100 MG tablet Take 1 tablet (100 mg total) by mouth daily. 05/09/24   Domenica Harlene LABOR, MD  venlafaxine  XR (EFFEXOR  XR) 75 MG 24 hr capsule  Take 1 capsule (75 mg total) by mouth daily with breakfast. 12/22/23   Domenica Harlene LABOR, MD   Past Surgical History:  Procedure Laterality Date   ANTERIOR CERVICAL DECOMP/DISCECTOMY FUSION N/A 05/31/2016   Procedure: ANTERIOR CERVICAL DECOMPRESSION/DISCECTOMY FUSION CERVICAL FIVE-SIX,CERVICAL SIX-SEVEN;  Surgeon: Victory Gunnels, MD;  Location: [facility] NEURO ORS;  Service: Neurosurgery;  Laterality: N/A;   CARDIAC CATHETERIZATION N/A 05/30/2015   Procedure: Left Heart Cath and Coronary Angiography;  Surgeon: Alm LELON Clay, MD;  Location: [facility] INVASIVE CV LAB;  Service: Cardiovascular;  Laterality: N/A;   CATARACT EXTRACTION     COLONOSCOPY     CORONARY ARTERY BYPASS GRAFT     2000   ESOPHAGOGASTRODUODENOSCOPY     LEFT HEART CATH AND CORS/GRAFTS ANGIOGRAPHY N/A 12/15/2017   Procedure: LEFT HEART CATH AND CORS/GRAFTS ANGIOGRAPHY;  Surgeon: Dann Candyce RAMAN, MD;  Location: [facility] INVASIVE CV LAB;  Service: Cardiovascular;  Laterality: N/A;   LEFT HEART CATHETERIZATION WITH CORONARY/GRAFT ANGIOGRAM N/A 08/01/2012   Procedure: LEFT HEART CATHETERIZATION WITH EL BILE;  Surgeon: Debby JONETTA Como, MD;  Location: [facility] CATH LAB;  Service: Cardiovascular;  Laterality: N/A;   LEFT HEART CATHETERIZATION WITH CORONARY/GRAFT ANGIOGRAM N/A 01/03/2015   Procedure: LEFT HEART CATHETERIZATION WITH EL BILE;  Surgeon: Dorn JINNY Lesches, MD;  Location: [facility] CATH LAB;  Service: Cardiovascular;  Laterality: N/A;   NASAL SEPTUM SURGERY     UP3   PERCUTANEOUS CORONARY STENT INTERVENTION (PCI-S)  10/06/2008   mRCA PCI  2.75 x 23 Abbott Xience V drug-eluting stent    RIGHT/LEFT HEART CATH AND CORONARY/GRAFT ANGIOGRAPHY N/A 01/07/2020   Procedure: RIGHT/LEFT HEART CATH AND CORONARY/GRAFT ANGIOGRAPHY;  Surgeon: Claudene Victory LELON, MD;  Location: [facility] INVASIVE CV LAB;  Service: Cardiovascular;  Laterality: N/A;   TOTAL KNEE ARTHROPLASTY Right 09/22/2021   Procedure: RIGHT TOTAL KNEE ARTHROPLASTY;  Surgeon:  Sheril Coy, MD;  Location: [facility] ORS;  Service: Orthopedics;  Laterality: Right;   ULTRASOUND GUIDANCE FOR VASCULAR ACCESS  12/15/2017   Procedure: Ultrasound Guidance For Vascular Access;  Surgeon: Dann Candyce RAMAN, MD;  Location:  [facility] INVASIVE CV LAB;  Service: Cardiovascular;;   Family History  Problem Relation Age of Onset   Pancreatic cancer Brother    Diabetes Brother    Coronary artery disease Brother    Stroke Brother    Diabetes Brother    Diabetes Mother    Heart failure Mother    Heart failure Father    Hypothyroidism Brother    Coronary artery disease Brother    Other Brother        colon surgery   Heart attack Other        Nephew   Irregular heart beat Daughter    Cancer Maternal Grandmother        unknown    Colon cancer Neg Hx    Stomach cancer Neg Hx    Liver cancer Neg Hx    Rectal cancer Neg Hx    Esophageal cancer Neg Hx    Social History   Socioeconomic History   Marital status: Married    Spouse name: Not on file   Number of children: 4   Years of education: 13   Highest education level: Not on file  Occupational History   Occupation: retired  Tobacco Use   Smoking status: Never    Passive exposure: Never   Smokeless tobacco: Never  Vaping Use   Vaping status: Never Used  Substance and Sexual Activity   Alcohol use: No    Alcohol/week: 0.0 standard drinks of alcohol    Comment: stopped drinking in 1998   Drug use: No   Sexual activity: Not Currently    Partners: Female  Other Topics Concern   Not on file  Social History Narrative   Patient is right handed.   Patient does not drink caffeine.   Social Drivers of Corporate Investment Banker Strain: Low Risk  (05/24/2023)   Overall Financial Resource Strain (CARDIA)    Difficulty of Paying Living Expenses: Not very hard  Food Insecurity: No Food Insecurity (11/01/2023)   Hunger Vital Sign    Worried About Running Out of Food in the Last Year: Never true    Ran Out of Food in the  Last Year: Never true  Transportation Needs: No Transportation Needs (11/01/2023)   PRAPARE - Administrator, Civil Service (Medical): No    Lack of Transportation (Non-Medical): No  Physical Activity: Insufficiently Active (08/02/2022)   Exercise Vital Sign    Days of Exercise per Week: 7 days    Minutes of Exercise per Session: 20 min  Stress: No Stress Concern Present (05/30/2023)   [employer] of Occupational Health - Occupational Stress Questionnaire    Feeling of Stress : Only a little  Social Connections: Moderately Isolated (08/02/2022)   Social Connection and Isolation Panel    Frequency of Communication with Friends and Family: More than three times a week    Frequency of Social Gatherings with Friends and Family: More than three times a week    Attends Religious Services: More than 4 times per year    Active Member of [employer] or Organizations: No    Attends Banker Meetings: Never    Marital Status: Widowed  Intimate Partner Violence: Unknown (10/15/2023)   Humiliation, Afraid, Rape, and Kick questionnaire    Fear of Current or Ex-Partner: Patient declined    Emotionally Abused: No    Physically Abused: No    Sexually Abused: No   Assessment and Plan:    Obstructive sleep apnea Obstructive sleep  apnea with 14 apneic events per hour per 2017 study. CPAP effective but discontinued due to equipment failure. Reports poor sleep quality, daytime somnolence, and ceased driving due to sleepiness. No recent nasal or throat surgeries. History of nasal surgery for broken nose and palatoplasty. No sleep aids or significant caffeine intake. No smoking history. Previous cardiac bypass surgery. - Order new sleep study to reassess apnea, REM Behavior Disorder,  and facilitate CPAP insurance coverage. - Coordinate in-center sleep study at [facility], moving to [facility] in September. - Share results with neurologist at [facility].  Restless sleep with nocturnal movements Reports restless sleep with nocturnal movements, tearing up bed covers, and being awakened by dreams. Possible REM behavior disorder considered, more common in older men. - Include assessment of nocturnal movements and potential REM behavior disorder in sleep study. - Ensure sleep study records leg movements and physical activities during sleep.  - Neurology will assess connection to his dementia.   10/23/24- 78 yoM followed for OSA, complicated by  Dementia, managed by Neurology, CAD/ CABG. Sleep study lab 08/2016 AHI 14.n alternatives. Discussed autopap. I suspect , with no weight change and previous comfort, that his Daughter here- agrees with plan.  Autocpap 5-15 cm water  per [facility] night Study 09/26/24- AHI 13/ hr, desat to 85%, body weight 175 lbs. Titration required BIPAP 23/19. Residual AHI was 0/hr, desat nadir 94%. Mild PLM. Body weight today-186 lbs For treatment decision- He did well before CPAP broke and is comfortable with getting replacement CPAP. I rather suspect that his recent study over-titrated him. We are going to let him try with auto 5-15 which was comfortable until machine broke.  ROS-see HPI   + = positive Constitutional:    weight loss, night sweats, fevers, chills, fatigue, lassitude. HEENT:    headaches, difficulty swallowing, +tooth/dental problems, sore throat,       sneezing, itching, ear ache, nasal congestion, post nasal drip, snoring CV:   + chest pain, orthopnea, PND, swelling in lower extremities, anasarca,                                   dizziness, +palpitations Resp:   +shortness of breath with exertion or at rest.                productive cough,   non-productive cough, coughing up of blood.              change in color of mucus.  wheezing.   Skin:    rash or lesions. GI:  No-   heartburn, indigestion, abdominal pain, nausea, vomiting, diarrhea,                 change in bowel habits,  loss of appetite GU: dysuria, change in color of urine, no urgency or frequency.   flank pain. MS:   joint pain, +stiffness, decreased range of motion, back pain. Neuro-     +HPI Psych:  change in mood or affect.  depression or anxiety.   +memory loss.  OBJ- Physical Exam General- Alert, Oriented, Affect-appropriate, Distress- none acute Skin- rash-none, lesions- none, excoriation- none Lymphadenopathy- none Head- atraumatic            Eyes- Gross vision intact, PERRLA, conjunctivae and secretions clear            Ears- +Hearing aids  Nose- Clear, no-Septal dev, mucus, polyps, erosion, perforation             Throat- Mallampati II/ UPPP, mucosa clear , drainage- none, tonsils- atrophic Neck- flexible , trachea midline, no stridor , thyroid  nl, carotid no bruit Chest - symmetrical excursion , unlabored           Heart/CV- RRR , no murmur , no gallop  , no rub, nl s1 s2                           - JVD- none , edema- none, stasis changes- none, varices- none           Lung- clear to P&A, wheeze- none, cough- none , dullness-none, rub- none           Chest wall-  Abd-  Br/ Gen/ Rectal- Not done, not indicated Extrem- cyanosis- none, clubbing, none, atrophy- none, strength- nl Neuro- grossly intact to observation

## 2024-10-23 ENCOUNTER — Encounter: Payer: Self-pay | Admitting: Internal Medicine

## 2024-10-23 ENCOUNTER — Other Ambulatory Visit: Payer: Self-pay

## 2024-10-23 ENCOUNTER — Ambulatory Visit: Admitting: Internal Medicine

## 2024-10-23 VITALS — BP 116/60 | HR 62 | Temp 97.6°F | Ht 69.0 in | Wt 186.8 lb

## 2024-10-23 DIAGNOSIS — F0390 Unspecified dementia without behavioral disturbance: Secondary | ICD-10-CM

## 2024-10-23 DIAGNOSIS — G4733 Obstructive sleep apnea (adult) (pediatric): Secondary | ICD-10-CM | POA: Diagnosis not present

## 2024-10-23 NOTE — Assessment & Plan Note (Signed)
 May need BIPAP, but titration seems too high. Plan- try replacing machine with  auto 5-15

## 2024-10-23 NOTE — Assessment & Plan Note (Signed)
 Dementia not apparent during our modest conversation. Daughter stepped in a few times.

## 2024-10-23 NOTE — Patient Instructions (Addendum)
 Order- DME [facility] branch)  please provide new CPAP auto 5-15, mask of choice, humidifier, supplies, AirView/ card  Ask the front desk to bring you back with a sleep doctor   Please call if we can help

## 2024-10-24 ENCOUNTER — Other Ambulatory Visit: Payer: Self-pay

## 2024-10-24 ENCOUNTER — Other Ambulatory Visit (HOSPITAL_COMMUNITY): Payer: Self-pay

## 2024-10-24 ENCOUNTER — Encounter: Payer: Self-pay | Admitting: Internal Medicine

## 2024-10-24 ENCOUNTER — Telehealth: Payer: Self-pay

## 2024-10-24 NOTE — Telephone Encounter (Signed)
 Pharmacy called to get change in manufacturer  for levothyroxine  from [employer] to [employer] approved.  Call back [phone]

## 2024-10-24 NOTE — Telephone Encounter (Signed)
 Pharmacy aware manufacturer change ok.

## 2024-10-25 ENCOUNTER — Other Ambulatory Visit: Payer: Self-pay

## 2024-10-26 ENCOUNTER — Other Ambulatory Visit: Payer: Self-pay

## 2024-10-29 ENCOUNTER — Other Ambulatory Visit: Payer: Self-pay

## 2024-11-12 ENCOUNTER — Other Ambulatory Visit (HOSPITAL_BASED_OUTPATIENT_CLINIC_OR_DEPARTMENT_OTHER): Payer: Self-pay

## 2024-11-12 ENCOUNTER — Other Ambulatory Visit: Payer: Self-pay

## 2024-11-19 ENCOUNTER — Other Ambulatory Visit: Payer: Self-pay

## 2024-11-19 ENCOUNTER — Other Ambulatory Visit (HOSPITAL_COMMUNITY): Payer: Self-pay

## 2024-11-19 ENCOUNTER — Other Ambulatory Visit (HOSPITAL_BASED_OUTPATIENT_CLINIC_OR_DEPARTMENT_OTHER): Payer: Self-pay

## 2024-11-20 ENCOUNTER — Other Ambulatory Visit: Payer: Self-pay | Admitting: Family

## 2024-11-20 ENCOUNTER — Other Ambulatory Visit: Payer: Self-pay

## 2024-11-20 ENCOUNTER — Other Ambulatory Visit: Payer: Self-pay | Admitting: Neurology

## 2024-11-20 MED ORDER — QUETIAPINE FUMARATE 25 MG PO TABS
25.0000 mg | ORAL_TABLET | Freq: Every day | ORAL | 6 refills | Status: AC
  Filled 2024-11-20: qty 30, 30d supply, fill #0
  Filled 2024-12-23: qty 30, 30d supply, fill #1

## 2024-11-20 MED ORDER — AMIODARONE HCL 200 MG PO TABS
200.0000 mg | ORAL_TABLET | Freq: Every day | ORAL | 1 refills | Status: AC
  Filled 2024-11-20: qty 30, 30d supply, fill #0
  Filled 2024-12-23: qty 30, 30d supply, fill #1

## 2024-11-21 ENCOUNTER — Other Ambulatory Visit: Payer: Self-pay

## 2024-11-21 ENCOUNTER — Other Ambulatory Visit (HOSPITAL_BASED_OUTPATIENT_CLINIC_OR_DEPARTMENT_OTHER): Payer: Self-pay

## 2024-11-22 ENCOUNTER — Other Ambulatory Visit: Payer: Self-pay

## 2024-11-26 ENCOUNTER — Other Ambulatory Visit: Payer: Self-pay

## 2024-11-26 ENCOUNTER — Other Ambulatory Visit (HOSPITAL_COMMUNITY): Payer: Self-pay

## 2024-11-26 ENCOUNTER — Other Ambulatory Visit (HOSPITAL_BASED_OUTPATIENT_CLINIC_OR_DEPARTMENT_OTHER): Payer: Self-pay

## 2024-11-27 ENCOUNTER — Other Ambulatory Visit (HOSPITAL_COMMUNITY): Payer: Self-pay

## 2024-11-27 ENCOUNTER — Other Ambulatory Visit: Payer: Self-pay

## 2024-12-03 ENCOUNTER — Telehealth: Payer: Self-pay | Admitting: Internal Medicine

## 2024-12-03 ENCOUNTER — Encounter: Payer: Self-pay | Admitting: Internal Medicine

## 2024-12-03 ENCOUNTER — Other Ambulatory Visit (HOSPITAL_BASED_OUTPATIENT_CLINIC_OR_DEPARTMENT_OTHER): Payer: Self-pay

## 2024-12-03 ENCOUNTER — Other Ambulatory Visit

## 2024-12-03 ENCOUNTER — Ambulatory Visit: Admitting: Internal Medicine

## 2024-12-03 VITALS — BP 118/68 | Ht 69.0 in | Wt 185.0 lb

## 2024-12-03 DIAGNOSIS — N1831 Chronic kidney disease, stage 3a: Secondary | ICD-10-CM

## 2024-12-03 DIAGNOSIS — E1159 Type 2 diabetes mellitus with other circulatory complications: Secondary | ICD-10-CM | POA: Diagnosis not present

## 2024-12-03 DIAGNOSIS — Z794 Long term (current) use of insulin: Secondary | ICD-10-CM

## 2024-12-03 DIAGNOSIS — E1142 Type 2 diabetes mellitus with diabetic polyneuropathy: Secondary | ICD-10-CM | POA: Diagnosis not present

## 2024-12-03 DIAGNOSIS — E1122 Type 2 diabetes mellitus with diabetic chronic kidney disease: Secondary | ICD-10-CM | POA: Diagnosis not present

## 2024-12-03 DIAGNOSIS — E039 Hypothyroidism, unspecified: Secondary | ICD-10-CM | POA: Diagnosis not present

## 2024-12-03 LAB — POCT GLYCOSYLATED HEMOGLOBIN (HGB A1C): Hemoglobin A1C: 7.3 % — AB (ref 4.0–5.6)

## 2024-12-03 MED ORDER — GVOKE HYPOPEN 1-PACK 1 MG/0.2ML ~~LOC~~ SOAJ
1.0000 | SUBCUTANEOUS | 1 refills | Status: AC | PRN
  Filled 2024-12-03: qty 0.2, 1d supply, fill #0

## 2024-12-03 NOTE — Patient Instructions (Addendum)
-   Continue Farxiga  10 mg , 1 tablet every morning  - Continue Toujeo  56 units ONCE daily  - Continue Humalog  10 units with each meal ( breakfast, lunch and supper) -Humalog  correctional insulin : ADD extra units on insulin  to your meal-time Humalog  dose if your blood sugars are higher than 160. Use the scale below to help guide you:   Blood sugar before meal Number of units to inject  Less than 160 0 unit  161 - 190 1 units  191 - 220 2 units  221 - 250 3 units  251 - 280 4 units  281 - 310 5 units  311 - 340 6 units  341- 370 7 units   HOW TO TREAT LOW BLOOD SUGARS (Blood sugar LESS THAN 70 MG/DL) Please follow the RULE OF 15 for the treatment of hypoglycemia treatment (when your (blood sugars are less than 70 mg/dL)   STEP 1: Take 15 grams of carbohydrates when your blood sugar is low, which includes:  3-4 GLUCOSE TABS  OR 3-4 OZ OF JUICE OR REGULAR SODA OR ONE TUBE OF GLUCOSE GEL    STEP 2: RECHECK blood sugar in 15 MINUTES STEP 3: If your blood sugar is still low at the 15 minute recheck --> then, go back to STEP 1 and treat AGAIN with another 15 grams of carbohydrates.

## 2024-12-03 NOTE — Telephone Encounter (Signed)
 Tandem order done in parachute.

## 2024-12-03 NOTE — Progress Notes (Unsigned)
 "   Name: [name] [name]  Age/ Sex: 78 y.o., male   MRN/ DOB: [id number], [birth year]     PCP: Domenica Harlene DELENA, MD   Reason for Endocrinology Evaluation: Type 2 Diabetes Mellitus  Initial Endocrine Consultative Visit: 11/21/2017    PATIENT IDENTIFIER: [name] [name] is a 78 y.o. male with a past medical history of T2DM, HTN, CAD,Hypothyroidism CHF and OSA. The patient has followed with Endocrinology clinic since 11/21/2017 for consultative assistance with management of his diabetes.   On his initia visit with me 04/2022 he was on NPH only with an A1c 8.7 % . He declined SGLT-2i and opted for prandial insulin  . We restarted toujeo  and stopped NPH  DIABETIC HISTORY:  [name] [name] was diagnosed with DM in 1989.He was on Metformin  at some point.  His hemoglobin A1c has ranged from 6.4% in 2022, peaking at 8.7% in 2023.   SUBJECTIVE:   During the last visit (07/19/2024): A1c 7.1 %     Today (12/03/2024): [name] [name] is here for follow-up on diabetes management.  He checks his blood sugars multiple  times daily through CGM. The patient has had hypoglycemic episodes since the last clinic visit, patient is symptomatic with these episodes.  He follows with cardiology for CAD, A-fib and dyslipidemia He also follows with neurology for mild cognitive impairment, mild dementia and gait instability He is accompanied by his daughter Crystal   No nausea  No constipation but has occasional loose stools    HOME DIABETES REGIMEN:  Farxiga  10 mg daily Toujeo  56 units daily  Humalog  10 units TID QAC Correction Factor : HUmalog  (BG-130/25) Levothyroxine  125 mcg daily    Statin: yes ACE-I/ARB: no      CONTINUOUS GLUCOSE MONITORING RECORD INTERPRETATION      Dates of Recording:  11/29-12/11/2024  Sensor description: [employer] 2  Results statistics:   CGM use % of time 99  Average and SD 198/35.2  Time in range 44 %  % Time Above 180 33  % Time above 250 22  % Time  Below target 1   Glycemic patterns summary: BGs trend down overnight to optimal and remain elevated during the day Hypoglycemic episodes occurred after bolus  Overnight periods: Variable but mostly optimal   DIABETIC COMPLICATIONS: Microvascular complications:  Neuropathy, CKD III Denies:  Last Eye Exam: Completed 10/22/2022  Macrovascular complications:  CAD, PVD Denies: CVA   HISTORY:  Past Medical History:  Past Medical History:  Diagnosis Date   Acquired atrophy of thyroid  06/05/2013   Overview:  July 2014: controlled on synthroid  since 2012 May 2015: decreased to Synthroid  50 mcg  Last Assessment & Plan:  Pt doing well with most recent TSH within normal range.  Will continue current dosage of Synthroid  50mcg. Pt reminded to take medication on empty stomach. Will continue to monitor with periodic laboratory assessment in 3 months.    Anemia    hemoglobin 7.4, iron deficiency, January, 2011, 2 unit transfusion, endoscopy normal, capsule endoscopy February, 2011 no small bowel abnormalities.   Most likely source gastric erosions, followed by GI   Anxiety    Bradycardia    CAD (coronary artery disease)    A. CABG in 2000,status post cardiac cath in 2006, 2009 ....continued chest pain and SOB despite oral medication adjestments including Ranexa . B. Cath November 2009/ mRCA - 2.75 x 23 Abbott Xience V drug-eluting stent ...11/26/2008 to distal  RCA leading to acute marginal.  C. Cath 07/2012 for CP -  stable anatomy, med rx. d. cath 2015 and 05/30/2015 stable anatomy, consider Myoview  if has CP again   Carotid artery disease 08/01/2015   Doppler, May 29, 2015, 1-39% bilateral ICA    Cerebral ischemia    MRI November, 2010, chronic microvascular ischemia   CKD (chronic kidney disease), stage III (HCC)    Concussion    Depression    Bipolar   Diabetes mellitus (HCC)    Edema    Essential hypertension 06/05/2013   Overview:  July 2014: Controlled with Bystolic  Sep 2015:  Changed to atenolol.  Last Assessment & Plan:  Will change to atenolol for cost and follow Improved.  Medication compliance strongly encouraged BP: 116/64 mmHg    Overview:  July 2014: Controlled with Bystolic  Sep 2015: Changed to atenolol.  Last Assessment & Plan:  Will change to atenolol for cost and follow   Falling episodes    these have occurred in the past and again recurring 2011   Family history of adverse reaction to anesthesia    mother died during bypass surgery but not sure if it has to do with anesthesia   Gastric ulcer    GERD (gastroesophageal reflux disease)    H/O medication noncompliance    Due to loss of insurance   H/O multiple concussions    Hard of hearing    Heart murmur    History of blood transfusion 12/20/2013   History of kidney stones    Hx of CABG    2000,  / one median sternotomy suture broken her chest x-ray November, 2010, no clinical significance   Hyperlipidemia    Hypertension    pt. denies   Iron deficiency anemia    Long term (current) use of anticoagulants [Z79.01] 07/20/2016   Low back pain 06/12/2009   Qualifier: Diagnosis of  By: Georgian ROSALEA CHARM Lamar    Nephrolithiasis    Orthostasis    OSA (obstructive sleep apnea)    PAF (paroxysmal atrial fibrillation) (HCC)    a. dx 2017, started on amiodarone /warfarin but patient intermittently noncompliant.   PSVT (paroxysmal supraventricular tachycardia)    RBBB 07/09/2009   Qualifier: Diagnosis of  By: Micky, MD, [facility], Reyes Lenis    RLS (restless legs syndrome) 09/19/2009   Qualifier: Diagnosis of  By: Georgian ROSALEA CHARM Lamar   Overview:  July 2014: Controlled with Mirapex   Last Assessment & Plan:  Patient is doing well. Will continue current management and follow clinically.   Seizure disorder (HCC) 01/09/2017   Spondylosis    C5-6, C6-7 MRI 2010   Syncope 03/2016   TBI (traumatic brain injury) (HCC) 2019   Thyroid  disease    Tubulovillous adenoma of colon 2007   Vitamin D  deficiency 05/17/2017    Wears glasses    Past Surgical History:  Past Surgical History:  Procedure Laterality Date   ANTERIOR CERVICAL DECOMP/DISCECTOMY FUSION N/A 05/31/2016   Procedure: ANTERIOR CERVICAL DECOMPRESSION/DISCECTOMY FUSION CERVICAL FIVE-SIX,CERVICAL SIX-SEVEN;  Surgeon: Victory Gunnels, MD;  Location: [facility];  Service: Neurosurgery;  Laterality: N/A;   CARDIAC CATHETERIZATION N/A 05/30/2015   Procedure: Left Heart Cath and Coronary Angiography;  Surgeon: Lenis LELON Clay, MD;  Location: [facility] INVASIVE CV LAB;  Service: Cardiovascular;  Laterality: N/A;   CATARACT EXTRACTION     COLONOSCOPY     CORONARY ARTERY BYPASS GRAFT     2000   ESOPHAGOGASTRODUODENOSCOPY     LEFT HEART CATH AND CORS/GRAFTS ANGIOGRAPHY N/A 12/15/2017   Procedure: LEFT HEART CATH AND CORS/GRAFTS ANGIOGRAPHY;  Surgeon: Dann Candyce RAMAN, MD;  Location: [facility] INVASIVE CV LAB;  Service: Cardiovascular;  Laterality: N/A;   LEFT HEART CATHETERIZATION WITH CORONARY/GRAFT ANGIOGRAM N/A 08/01/2012   Procedure: LEFT HEART CATHETERIZATION WITH EL BILE;  Surgeon: Debby JONETTA Como, MD;  Location: [facility] CATH LAB;  Service: Cardiovascular;  Laterality: N/A;   LEFT HEART CATHETERIZATION WITH CORONARY/GRAFT ANGIOGRAM N/A 01/03/2015   Procedure: LEFT HEART CATHETERIZATION WITH EL BILE;  Surgeon: Dorn JINNY Lesches, MD;  Location: [facility] CATH LAB;  Service: Cardiovascular;  Laterality: N/A;   NASAL SEPTUM SURGERY     UP3   PERCUTANEOUS CORONARY STENT INTERVENTION (PCI-S)  10/06/2008   mRCA PCI  2.75 x 23 Abbott Xience V drug-eluting stent    RIGHT/LEFT HEART CATH AND CORONARY/GRAFT ANGIOGRAPHY N/A 01/07/2020   Procedure: RIGHT/LEFT HEART CATH AND CORONARY/GRAFT ANGIOGRAPHY;  Surgeon: Claudene Victory ORN, MD;  Location: [facility] INVASIVE CV LAB;  Service: Cardiovascular;  Laterality: N/A;   TOTAL KNEE ARTHROPLASTY Right 09/22/2021   Procedure: RIGHT TOTAL KNEE ARTHROPLASTY;  Surgeon: Sheril Coy, MD;  Location: [facility] ORS;  Service:  Orthopedics;  Laterality: Right;   ULTRASOUND GUIDANCE FOR VASCULAR ACCESS  12/15/2017   Procedure: Ultrasound Guidance For Vascular Access;  Surgeon: Dann Candyce RAMAN, MD;  Location: [facility] INVASIVE CV LAB;  Service: Cardiovascular;;   Social History:  reports that he has never smoked. He has never been exposed to tobacco smoke. He has never used smokeless tobacco. He reports that he does not drink alcohol and does not use drugs. Family History:  Family History  Problem Relation Age of Onset   Pancreatic cancer Brother    Diabetes Brother    Coronary artery disease Brother    Stroke Brother    Diabetes Brother    Diabetes Mother    Heart failure Mother    Heart failure Father    Hypothyroidism Brother    Coronary artery disease Brother    Other Brother        colon surgery   Heart attack Other        Nephew   Irregular heart beat Daughter    Cancer Maternal Grandmother        unknown    Colon cancer Neg Hx    Stomach cancer Neg Hx    Liver cancer Neg Hx    Rectal cancer Neg Hx    Esophageal cancer Neg Hx      HOME MEDICATIONS: Allergies as of 12/03/2024       Reactions   Morphine Other (See Comments)   Hallucinations   Shellfish Protein-containing Drug Products Itching        Medication List        Accurate as of December 03, 2024  9:06 AM. If you have any questions, ask your nurse or doctor.          pramipexole  1 MG tablet Commonly known as: MIRAPEX  Take 1/2 - 1 tablet by mouth at bedtime The timing of this medication is very important.   amiodarone  200 MG tablet Commonly known as: PACERONE  Take 1 tablet (200 mg total) by mouth daily.   amitriptyline  10 MG tablet Commonly known as: ELAVIL  Take 1-3 tablets (10-30 mg total) by mouth at bedtime.   aspirin  EC 81 MG tablet Take 81 mg by mouth daily. Swallow whole.   atorvastatin  80 MG tablet Commonly known as: LIPITOR  Take 1 tablet (80 mg total) by mouth daily.   Eliquis  5 MG Tabs  tablet Generic drug: apixaban  Take 1 tablet (5 mg  total) by mouth 2 (two) times daily.   Farxiga  10 MG Tabs tablet Generic drug: dapagliflozin  propanediol Take 1 tablet (10 mg total) by mouth daily.   FreeStyle Libre 3 Plus Sensor Misc Change sensor every 15 days.   FreeStyle Libre 3 Plus Sensor Misc Place 1 sensor on the skin and change every 15 days.   hydrOXYzine  10 MG tablet Commonly known as: ATARAX  Take 1 tablet (10 mg total) by mouth 3 (three) times daily as needed for anxiety or itching.   insulin  lispro 100 UNIT/ML KwikPen Commonly known as: HumaLOG  KwikPen Inject 10-20 Units into the skin 3 (three) times daily.   isosorbide  mononitrate 120 MG 24 hr tablet Commonly known as: IMDUR  Take 1 tablet (120 mg total) by mouth daily.   levocetirizine 5 MG tablet Commonly known as: XYZAL  Take 1 tablet (5 mg total) by mouth every evening.   levothyroxine  125 MCG tablet Commonly known as: SYNTHROID  Take 1 tablet (125 mcg total) by mouth daily before breakfast.   memantine  10 MG tablet Commonly known as: Namenda  Take 1 tablet (10 mg total) by mouth 2 (two) times daily.   nitroGLYCERIN  0.4 MG SL tablet Commonly known as: NITROSTAT  Place 1 tablet (0.4 mg total) under the tongue every 5 (five) minutes x 3 doses as needed for chest pain.   QUEtiapine  25 MG tablet Commonly known as: SEROquel  Take 1 tablet (25 mg total) by mouth at bedtime.   ranolazine  500 MG 12 hr tablet Commonly known as: RANEXA  Take 1 tablet (500 mg total) by mouth 2 (two) times daily.   sertraline  100 MG tablet Commonly known as: ZOLOFT  Take 1 tablet (100 mg total) by mouth daily.   Toujeo  Max SoloStar 300 UNIT/ML Solostar Pen Generic drug: insulin  glargine (2 Unit Dial ) Inject 56 Units into the skin daily in the afternoon.   venlafaxine  XR 75 MG 24 hr capsule Commonly known as: Effexor  XR Take 1 capsule (75 mg total) by mouth daily with breakfast.         OBJECTIVE:   Vital Signs: BP  118/68   Ht 5' 9 (1.753 m)   Wt 185 lb (83.9 kg)   BMI 27.32 kg/m   Wt Readings from Last 3 Encounters:  12/03/24 185 lb (83.9 kg)  10/23/24 186 lb 12.8 oz (84.7 kg)  09/26/24 175 lb (79.4 kg)     Exam: General: Pt appears well and is in NAD  Lungs: Clear with good BS bilat  Heart: RRR   Abdomen:  soft, nontender  Extremities: No  pretibial edema.   Neuro: MS is good with appropriate affect, pt is alert and Ox3     DM foot exam 07/19/2024 per podiatry  The skin of the feet is intact without sores or ulcerations. The pedal pulses are 1+ on right and 1+ on left. The sensation is absent to a screening 5.07, 10 gram monofilament bilaterally at the toes     DATA REVIEWED:  Lab Results  Component Value Date   HGBA1C 7.3 (A) 12/03/2024   HGBA1C 7.1 (A) 07/19/2024   HGBA1C 8.0 (H) 02/07/2024     Latest Reference Range & Units 02/08/24 08:36  Total CHOL/HDL Ratio RATIO 2.5  Cholesterol 0 - 200 mg/dL 892  HDL Cholesterol >59 mg/dL 42  LDL (calc) 0 - 99 mg/dL 35  Triglycerides <849 mg/dL 850  VLDL 0 - 40 mg/dL 30    Latest Reference Range & Units 12/03/24 09:38  Sodium 135 - 146 mmol/L 141  Potassium  3.5 - 5.3 mmol/L 4.2  Chloride 98 - 110 mmol/L 104  CO2 20 - 32 mmol/L 29  Glucose 65 - 99 mg/dL 839 (H)  BUN 7 - 25 mg/dL 22  Creatinine 9.29 - 8.71 mg/dL 8.45 (H)  Calcium  8.6 - 10.3 mg/dL 9.4  BUN/Creatinine Ratio 6 - 22 (calc) 14  eGFR > OR = 60 mL/min/1.30m2 46 (L)     Latest Reference Range & Units 12/03/24 09:38  TSH 0.40 - 4.50 mIU/L 2.92  T4,Free(Direct) 0.8 - 1.8 ng/dL 1.3    Old records , labs and images have been reviewed.    ASSESSMENT / PLAN / RECOMMENDATIONS:   1) Type 2 Diabetes Mellitus, Optimally  controlled, With neuropathic, CKD III and macrovascular complications - Most recent A1c of 7.3%. Goal A1c <7.0%.    -A1c acceptable - Barriers to diabetes self-care is memory loss -He does tend to either forget to take prandial insulin  or at  times he will takes it too early or too late - Per his daughter Camelia, Jon who is his main caregiver has agreed to proceed with tandem pump - Will check serum glucose and C-peptide today - I referral to our CDE has been placed - I will change his sensitivity factor from 25 to 30 to prevent hypoglycemia - A prescription for Gvoke hypopen was sent to the pharmacy, but I did explain that this is going to be given if the patient has lost consciousness, oral intake is always a priority  MEDICATIONS: Continue Farxiga  10 mg daily Continue Toujeo  56 units ONCE daily  Continue Humalog  10 units TIDQAC Change correction Factor : HUmalog  (BG -130/30) 3 times daily before every meal  EDUCATION / INSTRUCTIONS: BG monitoring instructions: Patient is instructed to check his blood sugars 3 times a day, before meals . Call [place] [facility] if: BG persistently < 70  I reviewed the Rule of 15 for the treatment of hypoglycemia in detail with the patient. Literature supplied.   2) Diabetic complications:  Eye: Does not have known diabetic retinopathy.  Neuro/ Feet: Does  have known diabetic peripheral neuropathy .  Renal: Patient does  have known baseline CKD. He   is not on an ACEI/ARB at present.    3)Hypothyroidism:  -Patient is clinically euthyroid - TFTs are within normal range, no changes   Medication Continue levothyroxine  125 mcg daily   F/U in 4 months      Signed electronically by: Stefano Redgie Butts, MD  [facility] Endocrinology  [facility] [address] [place] [place], [place] [address] Phone: [phone] FAX: [phone]   CC: Domenica Harlene LABOR, MD [address] [place] [address] Phone: [phone]  Fax: [phone]  Return to Endocrinology clinic as below: Future Appointments  Date Time Provider Department Center  12/03/2024  9:10 AM Steve Gregg, Donell Redgie, MD [facility] None  02/01/2025  8:30 AM Theodoro Lakes, MD [facility] [address]  02/07/2025 11:45 AM Gregg Lek, MD [facility] None  04/22/2025  2:20 PM Domenica Harlene LABOR, MD [facility] [address]    "

## 2024-12-03 NOTE — Telephone Encounter (Signed)
 Can you please proceed tandem pump orders on this patient?   C-peptide is pending  Thanks

## 2024-12-04 ENCOUNTER — Other Ambulatory Visit (HOSPITAL_BASED_OUTPATIENT_CLINIC_OR_DEPARTMENT_OTHER): Payer: Self-pay

## 2024-12-04 ENCOUNTER — Ambulatory Visit: Payer: Self-pay | Admitting: Internal Medicine

## 2024-12-04 LAB — BASIC METABOLIC PANEL WITH GFR
BUN/Creatinine Ratio: 14 (calc) (ref 6–22)
BUN: 22 mg/dL (ref 7–25)
CO2: 29 mmol/L (ref 20–32)
Calcium: 9.4 mg/dL (ref 8.6–10.3)
Chloride: 104 mmol/L (ref 98–110)
Creat: 1.54 mg/dL — ABNORMAL HIGH (ref 0.70–1.28)
Glucose, Bld: 160 mg/dL — ABNORMAL HIGH (ref 65–99)
Potassium: 4.2 mmol/L (ref 3.5–5.3)
Sodium: 141 mmol/L (ref 135–146)
eGFR: 46 mL/min/1.73m2 — ABNORMAL LOW

## 2024-12-04 LAB — C-PEPTIDE: C-Peptide: 1.34 ng/mL (ref 0.80–3.85)

## 2024-12-04 LAB — TSH: TSH: 2.92 m[IU]/L (ref 0.40–4.50)

## 2024-12-04 LAB — T4, FREE: Free T4: 1.3 ng/dL (ref 0.8–1.8)

## 2024-12-04 MED ORDER — LEVOTHYROXINE SODIUM 125 MCG PO TABS
125.0000 ug | ORAL_TABLET | Freq: Every day | ORAL | 3 refills | Status: AC
  Filled 2024-12-04 – 2024-12-23 (×3): qty 90, 90d supply, fill #0

## 2024-12-05 ENCOUNTER — Other Ambulatory Visit (HOSPITAL_BASED_OUTPATIENT_CLINIC_OR_DEPARTMENT_OTHER): Payer: Self-pay

## 2024-12-18 ENCOUNTER — Other Ambulatory Visit (HOSPITAL_BASED_OUTPATIENT_CLINIC_OR_DEPARTMENT_OTHER): Payer: Self-pay

## 2024-12-19 ENCOUNTER — Other Ambulatory Visit (HOSPITAL_BASED_OUTPATIENT_CLINIC_OR_DEPARTMENT_OTHER): Payer: Self-pay

## 2024-12-20 ENCOUNTER — Emergency Department (HOSPITAL_COMMUNITY)
Admission: EM | Admit: 2024-12-20 | Discharge: 2024-12-20 | Disposition: A | Discharge status: Home / Self Care | Attending: Emergency Medicine | Admitting: Emergency Medicine

## 2024-12-20 ENCOUNTER — Encounter (HOSPITAL_COMMUNITY): Payer: Self-pay

## 2024-12-20 ENCOUNTER — Emergency Department (HOSPITAL_COMMUNITY)

## 2024-12-20 ENCOUNTER — Other Ambulatory Visit (HOSPITAL_BASED_OUTPATIENT_CLINIC_OR_DEPARTMENT_OTHER): Payer: Self-pay

## 2024-12-20 ENCOUNTER — Other Ambulatory Visit: Payer: Self-pay

## 2024-12-20 DIAGNOSIS — I4891 Unspecified atrial fibrillation: Secondary | ICD-10-CM | POA: Diagnosis not present

## 2024-12-20 DIAGNOSIS — Z7982 Long term (current) use of aspirin: Secondary | ICD-10-CM | POA: Diagnosis not present

## 2024-12-20 DIAGNOSIS — Z7901 Long term (current) use of anticoagulants: Secondary | ICD-10-CM | POA: Insufficient documentation

## 2024-12-20 DIAGNOSIS — Z794 Long term (current) use of insulin: Secondary | ICD-10-CM | POA: Insufficient documentation

## 2024-12-20 DIAGNOSIS — I2510 Atherosclerotic heart disease of native coronary artery without angina pectoris: Secondary | ICD-10-CM | POA: Diagnosis not present

## 2024-12-20 DIAGNOSIS — I509 Heart failure, unspecified: Secondary | ICD-10-CM | POA: Diagnosis not present

## 2024-12-20 DIAGNOSIS — E1022 Type 1 diabetes mellitus with diabetic chronic kidney disease: Secondary | ICD-10-CM | POA: Insufficient documentation

## 2024-12-20 DIAGNOSIS — N189 Chronic kidney disease, unspecified: Secondary | ICD-10-CM | POA: Diagnosis not present

## 2024-12-20 DIAGNOSIS — W19XXXA Unspecified fall, initial encounter: Secondary | ICD-10-CM

## 2024-12-20 DIAGNOSIS — S0990XA Unspecified injury of head, initial encounter: Secondary | ICD-10-CM | POA: Insufficient documentation

## 2024-12-20 DIAGNOSIS — Z79899 Other long term (current) drug therapy: Secondary | ICD-10-CM | POA: Insufficient documentation

## 2024-12-20 DIAGNOSIS — I130 Hypertensive heart and chronic kidney disease with heart failure and stage 1 through stage 4 chronic kidney disease, or unspecified chronic kidney disease: Secondary | ICD-10-CM | POA: Insufficient documentation

## 2024-12-20 DIAGNOSIS — W1830XA Fall on same level, unspecified, initial encounter: Secondary | ICD-10-CM | POA: Diagnosis not present

## 2024-12-20 LAB — I-STAT CHEM 8, ED
BUN: 27 mg/dL — ABNORMAL HIGH (ref 8–23)
Calcium, Ion: 1.24 mmol/L (ref 1.15–1.40)
Chloride: 103 mmol/L (ref 98–111)
Creatinine, Ser: 1.6 mg/dL — ABNORMAL HIGH (ref 0.61–1.24)
Glucose, Bld: 117 mg/dL — ABNORMAL HIGH (ref 70–99)
HCT: 42 % (ref 39.0–52.0)
Hemoglobin: 14.3 g/dL (ref 13.0–17.0)
Potassium: 4.6 mmol/L (ref 3.5–5.1)
Sodium: 142 mmol/L (ref 135–145)
TCO2: 25 mmol/L (ref 22–32)

## 2024-12-20 LAB — COMPREHENSIVE METABOLIC PANEL WITH GFR
ALT: 40 U/L (ref 0–44)
AST: 35 U/L (ref 15–41)
Albumin: 4.4 g/dL (ref 3.5–5.0)
Alkaline Phosphatase: 114 U/L (ref 38–126)
Anion gap: 9 (ref 5–15)
BUN: 26 mg/dL — ABNORMAL HIGH (ref 8–23)
CO2: 26 mmol/L (ref 22–32)
Calcium: 9.5 mg/dL (ref 8.9–10.3)
Chloride: 104 mmol/L (ref 98–111)
Creatinine, Ser: 1.48 mg/dL — ABNORMAL HIGH (ref 0.61–1.24)
GFR, Estimated: 49 mL/min — ABNORMAL LOW
Glucose, Bld: 119 mg/dL — ABNORMAL HIGH (ref 70–99)
Potassium: 4.8 mmol/L (ref 3.5–5.1)
Sodium: 138 mmol/L (ref 135–145)
Total Bilirubin: 0.6 mg/dL (ref 0.0–1.2)
Total Protein: 7.1 g/dL (ref 6.5–8.1)

## 2024-12-20 LAB — CBC
HCT: 42.8 % (ref 39.0–52.0)
Hemoglobin: 14.7 g/dL (ref 13.0–17.0)
MCH: 32.2 pg (ref 26.0–34.0)
MCHC: 34.3 g/dL (ref 30.0–36.0)
MCV: 93.9 fL (ref 80.0–100.0)
Platelets: 152 K/uL (ref 150–400)
RBC: 4.56 MIL/uL (ref 4.22–5.81)
RDW: 13.8 % (ref 11.5–15.5)
WBC: 8.4 K/uL (ref 4.0–10.5)
nRBC: 0 % (ref 0.0–0.2)

## 2024-12-20 NOTE — ED Triage Notes (Incomplete)
 Trauma Response Nurse Documentation   [name] [name] is a 79 y.o. male arriving to [facility] ED via [facility]  On Eliquis . Trauma was activated as a Level 2 by Charge based on the following trauma criteria Elderly patients > 65 with head trauma on anti-coagulation (excluding ASA).  Patient cleared for CT by Dr. Yolande. Pt transported to CT with trauma response nurse present to monitor. RN remained with the patient throughout their absence from the department for clinical observation.   GCS 15.   History   Past Medical History:  Diagnosis Date   Acquired atrophy of thyroid  06/05/2013   Overview:  July 2014: controlled on synthroid  75mcg since 2012 May 2015: decreased to Synthroid  50 mcg  Last Assessment & Plan:  Pt doing well with most recent TSH within normal range.  Will continue current dosage of Synthroid  50mcg. Pt reminded to take medication on empty stomach. Will continue to monitor with periodic laboratory assessment in 3 months.    Anemia    hemoglobin 7.4, iron deficiency, January, 2011, 2 unit transfusion, endoscopy normal, capsule endoscopy February, 2011 no small bowel abnormalities.   Most likely source gastric erosions, followed by GI   Anxiety    Bradycardia    CAD (coronary artery disease)    A. CABG in 2000,status post cardiac cath in 2006, 2009 ....continued chest pain and SOB despite oral medication adjestments including Ranexa . B. Cath November 2009/ mRCA - 2.75 x 23 Abbott Xience V drug-eluting stent ...11/26/2008 to distal  RCA leading to acute marginal.  C. Cath 07/2012 for CP - stable anatomy, med rx. d. cath 2015 and 05/30/2015 stable anatomy, consider Myoview  if has CP again   Carotid artery disease 08/01/2015   Doppler, May 29, 2015, 1-39% bilateral ICA    Cerebral ischemia    MRI November, 2010, chronic microvascular ischemia   CKD (chronic kidney disease), stage III (HCC)    Concussion    Depression    Bipolar   Diabetes mellitus (HCC)     Edema    Essential hypertension 06/05/2013   Overview:  July 2014: Controlled with Bystolic  Sep 2015: Changed to atenolol.  Last Assessment & Plan:  Will change to atenolol for cost and follow Improved.  Medication compliance strongly encouraged BP: 116/64 mmHg    Overview:  July 2014: Controlled with Bystolic  Sep 2015: Changed to atenolol.  Last Assessment & Plan:  Will change to atenolol for cost and follow   Falling episodes    these have occurred in the past and again recurring 2011   Family history of adverse reaction to anesthesia    mother died during bypass surgery but not sure if it has to do with anesthesia   Gastric ulcer    GERD (gastroesophageal reflux disease)    H/O medication noncompliance    Due to loss of insurance   H/O multiple concussions    Hard of hearing    Heart murmur    History of blood transfusion 12/20/2013   History of kidney stones    Hx of CABG    2000,  / one median sternotomy suture broken her chest x-ray November, 2010, no clinical significance   Hyperlipidemia    Hypertension    pt. denies   Iron deficiency anemia    Long term (current) use of anticoagulants [Z79.01] 07/20/2016   Low back pain 06/12/2009   Qualifier: Diagnosis of  By: Georgian ROSALEA CHARM Lamar    Nephrolithiasis    Orthostasis  OSA (obstructive sleep apnea)    PAF (paroxysmal atrial fibrillation) (HCC)    a. dx 2017, started on amiodarone /warfarin but patient intermittently noncompliant.   PSVT (paroxysmal supraventricular tachycardia)    RBBB 07/09/2009   Qualifier: Diagnosis of  By: Micky, MD, [facility], Reyes Lenis    RLS (restless legs syndrome) 09/19/2009   Qualifier: Diagnosis of  By: Georgian ROSALEA CHARM Lamar   Overview:  July 2014: Controlled with Mirapex   Last Assessment & Plan:  Patient is doing well. Will continue current management and follow clinically.   Seizure disorder (HCC) 01/09/2017   Spondylosis    C5-6, C6-7 MRI 2010   Syncope 03/2016   TBI (traumatic brain injury) (HCC)  2019   Thyroid  disease    Tubulovillous adenoma of colon 2007   Vitamin D  deficiency 05/17/2017   Wears glasses      Past Surgical History:  Procedure Laterality Date   ANTERIOR CERVICAL DECOMP/DISCECTOMY FUSION N/A 05/31/2016   Procedure: ANTERIOR CERVICAL DECOMPRESSION/DISCECTOMY FUSION CERVICAL FIVE-SIX,CERVICAL SIX-SEVEN;  Surgeon: Victory Gunnels, MD;  Location: [facility];  Service: Neurosurgery;  Laterality: N/A;   CARDIAC CATHETERIZATION N/A 05/30/2015   Procedure: Left Heart Cath and Coronary Angiography;  Surgeon: Lenis LELON Clay, MD;  Location: [facility] INVASIVE CV LAB;  Service: Cardiovascular;  Laterality: N/A;   CATARACT EXTRACTION     COLONOSCOPY     CORONARY ARTERY BYPASS GRAFT     2000   ESOPHAGOGASTRODUODENOSCOPY     LEFT HEART CATH AND CORS/GRAFTS ANGIOGRAPHY N/A 12/15/2017   Procedure: LEFT HEART CATH AND CORS/GRAFTS ANGIOGRAPHY;  Surgeon: Dann Candyce RAMAN, MD;  Location: [facility] INVASIVE CV LAB;  Service: Cardiovascular;  Laterality: N/A;   LEFT HEART CATHETERIZATION WITH CORONARY/GRAFT ANGIOGRAM N/A 08/01/2012   Procedure: LEFT HEART CATHETERIZATION WITH EL BILE;  Surgeon: Debby JONETTA Como, MD;  Location: [facility] CATH LAB;  Service: Cardiovascular;  Laterality: N/A;   LEFT HEART CATHETERIZATION WITH CORONARY/GRAFT ANGIOGRAM N/A 01/03/2015   Procedure: LEFT HEART CATHETERIZATION WITH EL BILE;  Surgeon: Dorn JINNY Lesches, MD;  Location: [facility] CATH LAB;  Service: Cardiovascular;  Laterality: N/A;   NASAL SEPTUM SURGERY     UP3   PERCUTANEOUS CORONARY STENT INTERVENTION (PCI-S)  10/06/2008   mRCA PCI  2.75 x 23 Abbott Xience V drug-eluting stent    RIGHT/LEFT HEART CATH AND CORONARY/GRAFT ANGIOGRAPHY N/A 01/07/2020   Procedure: RIGHT/LEFT HEART CATH AND CORONARY/GRAFT ANGIOGRAPHY;  Surgeon: Claudene Victory LELON, MD;  Location: [facility] INVASIVE CV LAB;  Service: Cardiovascular;  Laterality: N/A;   TOTAL KNEE ARTHROPLASTY Right 09/22/2021   Procedure: RIGHT TOTAL  KNEE ARTHROPLASTY;  Surgeon: Sheril Coy, MD;  Location: [facility] ORS;  Service: Orthopedics;  Laterality: Right;   ULTRASOUND GUIDANCE FOR VASCULAR ACCESS  12/15/2017   Procedure: Ultrasound Guidance For Vascular Access;  Surgeon: Dann Candyce RAMAN, MD;  Location: [facility] INVASIVE CV LAB;  Service: Cardiovascular;;       Initial Focused Assessment (If applicable, or please see trauma documentation): Airway - Clear Breathing - Unlabored Circulation - no bleeding noted- hematoma to right forehead GCS - 15 -- per daughter - early stages of Alzheimer's recently dx  CT's Completed:   CT Head, CT C-Spine, CT Chest w/ contrast, and CT abdomen/pelvis w/ contrast   Interventions:  Labs Xrays CT scans  Plan for disposition:  {Trauma Dispo:26867}   Consults completed:  {Trauma Consults:26862} at ***.  Event Summary:  Darice CHRISTELLA Rouleau  Trauma Response RN  Please call TRN at [phone] for further assistance.

## 2024-12-20 NOTE — Discharge Instructions (Signed)
 You were seen for your fall in the emergency department.   At home, please use Tylenol  for your pain.  Ice your forehead to limit the swelling.    Check your MyChart online for the results of any tests that had not resulted by the time you left the emergency department.   Follow-up with your primary doctor in 2-3 days regarding your visit.    Return immediately to the emergency department if you experience any of the following: Severe headache, vomiting, or any other concerning symptoms.    Thank you for visiting our Emergency Department. It was a pleasure taking care of you today.

## 2024-12-20 NOTE — ED Provider Notes (Signed)
 " [place] [facility] AT [facility] Provider Note   CSN: [id number] Arrival date & time: 12/20/24  1759     Patient presents with: FOT L2   [name] [name] is a 79 y.o. male.   79 year old male history of CAD, CHF, hypertension, insulin -dependent diabetes, and AF on Eliquis  use who presents to the emergency department after a fall.  Patient states he was walking and turned around too fast.  Lost his balance and fell.  Did hit his head.  No LOC.  Also is having neck pain.  Says he has got chronic bilateral lower extremity pain but no other injuries as a result of the fall.       Prior to Admission medications  Medication Sig Start Date End Date Taking? Authorizing Provider  amiodarone  (PACERONE ) 200 MG tablet Take 1 tablet (200 mg total) by mouth daily. 11/20/24   Domenica Harlene DELENA, MD  amitriptyline  (ELAVIL ) 10 MG tablet Take 1-3 tablets (10-30 mg total) by mouth at bedtime. 10/15/24   Domenica Harlene DELENA, MD  apixaban  (ELIQUIS ) 5 MG TABS tablet Take 1 tablet (5 mg total) by mouth 2 (two) times daily. 07/02/24   Lucien Orren SAILOR, PA-C  aspirin  EC 81 MG tablet Take 81 mg by mouth daily. Swallow whole.    [provider]  atorvastatin  (LIPITOR ) 80 MG tablet Take 1 tablet (80 mg total) by mouth daily. 08/15/24   Domenica Harlene DELENA, MD  Continuous Glucose Sensor (FREESTYLE LIBRE 3 PLUS SENSOR) MISC Change sensor every 15 days. 07/19/24   Shamleffer, Ibtehal Jaralla, MD  Continuous Glucose Sensor (FREESTYLE LIBRE 3 PLUS SENSOR) MISC Place 1 sensor on the skin and change every 15 days. 10/16/24   Shamleffer, Ibtehal Jaralla, MD  dapagliflozin  propanediol (FARXIGA ) 10 MG TABS tablet Take 1 tablet (10 mg total) by mouth daily. 07/19/24   Shamleffer, Donell Cardinal, MD  Glucagon  (GVOKE HYPOPEN  1-PACK) 1 MG/0.2ML SOAJ Inject 1 Device into the skin as needed. 12/03/24   Shamleffer, Ibtehal Jaralla, MD  hydrOXYzine  (ATARAX ) 10 MG tablet Take 1 tablet (10 mg total) by mouth 3  (three) times daily as needed for anxiety or itching. 03/21/24   Domenica Harlene DELENA, MD  insulin  glargine, 2 Unit Dial , (TOUJEO  MAX SOLOSTAR) 300 UNIT/ML Solostar Pen Inject 56 Units into the skin daily in the afternoon. 07/19/24   Shamleffer, Ibtehal Jaralla, MD  insulin  lispro (HUMALOG  KWIKPEN) 100 UNIT/ML KwikPen Inject 10-20 Units into the skin 3 (three) times daily. 07/19/24   Shamleffer, Ibtehal Jaralla, MD  isosorbide  mononitrate (IMDUR ) 120 MG 24 hr tablet Take 1 tablet (120 mg total) by mouth daily. 12/21/24   Domenica Harlene DELENA, MD  levocetirizine (XYZAL ) 5 MG tablet Take 1 tablet (5 mg total) by mouth every evening. 10/15/24   Domenica Harlene DELENA, MD  levothyroxine  (SYNTHROID ) 125 MCG tablet Take 1 tablet (125 mcg total) by mouth daily before breakfast. 12/04/24   Shamleffer, Ibtehal Jaralla, MD  memantine  (NAMENDA ) 10 MG tablet Take 1 tablet (10 mg total) by mouth 2 (two) times daily. 02/01/24 01/26/25  Domenica Harlene DELENA, MD  nitroGLYCERIN  (NITROSTAT ) 0.4 MG SL tablet Place 1 tablet (0.4 mg total) under the tongue every 5 (five) minutes x 3 doses as needed for chest pain. 10/15/24   Domenica Harlene DELENA, MD  pramipexole  (MIRAPEX ) 1 MG tablet Take 1/2 - 1 tablet by mouth at bedtime 08/15/24   Domenica Harlene DELENA, MD  QUEtiapine  (SEROQUEL ) 25 MG tablet Take 1 tablet (25 mg total)  by mouth at bedtime. 11/20/24 06/18/25  Camara, Amadou, MD  ranolazine  (RANEXA ) 500 MG 12 hr tablet Take 1 tablet (500 mg total) by mouth 2 (two) times daily. 12/21/24   Domenica Harlene LABOR, MD  sertraline  (ZOLOFT ) 100 MG tablet Take 1 tablet (100 mg total) by mouth daily. 08/15/24   Domenica Harlene LABOR, MD  venlafaxine  XR (EFFEXOR  XR) 75 MG 24 hr capsule Take 1 capsule (75 mg total) by mouth daily with breakfast. 08/15/24   Domenica Harlene LABOR, MD    Allergies: Morphine and Shellfish protein-containing drug products    Review of Systems  Updated Vital Signs BP (!) 130/57   Pulse (!) 56   Temp (!) 97.1 F (36.2 C) (Oral)   Resp 16   Ht 5' 9 (1.753  m)   Wt 83.9 kg   SpO2 97%   BMI 27.32 kg/m   Physical Exam Constitutional:      General: He is not in acute distress.    Appearance: Normal appearance. He is not ill-appearing.  HENT:     Head: Normocephalic and atraumatic.     Right Ear: External ear normal.     Left Ear: External ear normal.     Mouth/Throat:     Mouth: Mucous membranes are moist.     Pharynx: Oropharynx is clear.  Eyes:     Extraocular Movements: Extraocular movements intact.     Conjunctiva/sclera: Conjunctivae normal.     Pupils: Pupils are equal, round, and reactive to light.  Neck:     Comments: Placed in c-collar Cardiovascular:     Rate and Rhythm: Normal rate and regular rhythm.     Pulses: Normal pulses.     Heart sounds: Normal heart sounds.  Pulmonary:     Effort: Pulmonary effort is normal. No respiratory distress.     Breath sounds: Normal breath sounds.  Abdominal:     General: Abdomen is flat. There is no distension.     Palpations: Abdomen is soft. There is no mass.     Tenderness: There is no abdominal tenderness. There is no guarding.     Comments: Bruising from insulin   Musculoskeletal:        General: No deformity. Normal range of motion.     Cervical back: Tenderness present.     Comments: No tenderness to palpation of midline thoracic or lumbar spine.  No step-offs palpated.  No tenderness to palpation of chest wall.  No bruising noted.  No tenderness to palpation of bilateral clavicles.  No tenderness to palpation, bruising, or deformities noted of bilateral shoulders, elbows, wrists, hips, knees, or ankles.  Neurological:     General: No focal deficit present.     Mental Status: He is alert and oriented to person, place, and time. Mental status is at baseline.     Cranial Nerves: No cranial nerve deficit.     Sensory: No sensory deficit.     Motor: No weakness.     (all labs ordered are listed, but only abnormal results are displayed) Labs Reviewed  COMPREHENSIVE  METABOLIC PANEL WITH GFR - Abnormal; Notable for the following components:      Result Value   Glucose, Bld 119 (*)    BUN 26 (*)    Creatinine, Ser 1.48 (*)    GFR, Estimated 49 (*)    All other components within normal limits  I-STAT CHEM 8, ED - Abnormal; Notable for the following components:   BUN 27 (*)    Creatinine,  Ser 1.60 (*)    Glucose, Bld 117 (*)    All other components within normal limits  CBC    EKG: EKG Interpretation Date/Time:  Thursday December 20 2024 18:05:59 EST Ventricular Rate:  65 PR Interval:  212 QRS Duration:  168 QT Interval:  467 QTC Calculation: 486 R Axis:   260  Text Interpretation: Sinus rhythm Borderline prolonged PR interval RBBB and LAFB Confirmed by Yolande Charleston [id number]) on 12/20/2024 6:07:35 PM  Radiology: CT CERVICAL SPINE WO CONTRAST Result Date: 12/20/2024 EXAM: CT CERVICAL SPINE WITHOUT CONTRAST 12/20/2024 06:22:29 PM TECHNIQUE: CT of the cervical spine was performed without the administration of intravenous contrast. Multiplanar reformatted images are provided for review. Automated exposure control, iterative reconstruction, and/or weight based adjustment of the mA/kV was utilized to reduce the radiation dose to as low as reasonably achievable. COMPARISON: None available. CLINICAL HISTORY: Polytrauma, blunt. FINDINGS: BONES AND ALIGNMENT: C5-C7 interspaces fusion surgical hardware. Grade 1 anterolisthesis of C4 on C5. No acute fracture. DEGENERATIVE CHANGES: Multilevel mild-to-moderate degenerative changes of the spine. SOFT TISSUES: No prevertebral soft tissue swelling. LUNGS: Biapical pleural and pulmonary scarring. IMPRESSION: 1. No acute findings. Electronically signed by: Morgane Naveau MD 12/20/2024 06:37 PM EST RP Workstation: [id number]   CT HEAD WO CONTRAST Result Date: 12/20/2024 EXAM: CT HEAD WITHOUT CONTRAST 12/20/2024 06:22:29 PM TECHNIQUE: CT of the head was performed without the administration of intravenous contrast.  Automated exposure control, iterative reconstruction, and/or weight based adjustment of the mA/kV was utilized to reduce the radiation dose to as low as reasonably achievable. COMPARISON: None available. CLINICAL HISTORY: Head trauma, moderate-severe. FINDINGS: BRAIN AND VENTRICLES: No acute hemorrhage. No evidence of acute infarct. Patchy and confluent decreased attenuation throughout bilateral cerebral hemispheres deep and periventricular white matter, compatible with chronic microvascular ischemic disease. Cerebral ventricle sizes concordant with cerebral volume loss. No hydrocephalus. No extra-axial collection. No mass effect or midline shift. Atherosclerotic calcifications within cavernous internal carotid and vertebral arteries. ORBITS: Bilateral lens replacement. SINUSES: Periapical lucency along the maxillary teeth. SOFT TISSUES AND SKULL: Right frontal scalp 6 mm soft tissue hematoma. No skull fracture. IMPRESSION: 1. No acute intracranial abnormality related to head trauma. 2. Periapical lucency along the maxillary teeth, which can be seen with periodontal disease. Electronically signed by: Morgane Naveau MD 12/20/2024 06:34 PM EST RP Workstation: [id number]   DG Pelvis Portable Result Date: 12/20/2024 EXAM: 1 or 2 VIEW(S) XRAY OF THE PELVIS 12/20/2024 06:17:00 PM COMPARISON: None available. CLINICAL HISTORY: Trauma Trauma Trauma FINDINGS: BONES AND JOINTS: No acute fracture. No malalignment. Mild degenerative changes of bilateral hips. SOFT TISSUES: Vascular calcifications. IMPRESSION: 1. No evidence of acute traumatic injury. Electronically signed by: Morgane Naveau MD 12/20/2024 06:32 PM EST RP Workstation: [id number]   DG Chest Port 1 View Result Date: 12/20/2024 CLINICAL DATA:  Trauma. EXAM: PORTABLE CHEST 1 VIEW COMPARISON:  Chest radiograph dated 02/07/2024. FINDINGS: Shallow inspiration. Faint bilateral interstitial streaky densities may represent atelectasis. Atypical infiltrate or  aspiration are not excluded. No pneumothorax. Mild cardiomegaly. Median sternotomy wires and CABG vascular clips. No acute osseous pathology. IMPRESSION: Faint bilateral interstitial streaky densities may represent atelectasis. Atypical infiltrate or aspiration are not excluded. Electronically Signed   By: Vanetta Chou [name]   On: 12/20/2024 18:26     Procedures   Medications Ordered in the ED - No data to display  Medical Decision Making Amount and/or Complexity of Data Reviewed Labs: ordered. Radiology: ordered.   [name] [name] is a 79 year old male history of CAD, CHF, hypertension, insulin -dependent diabetes, and AF on Eliquis  use who presents to the emergency department after a fall.   Initial Ddx:  TBI, c-spine injury, mechanical fall, syncope   MDM:  Concerned about possible TBI given the patient's fall and the fact that they are on blood thinners so will obtain a head CT at this time.  With their age and TTP we will also obtain a CT of the C-spine as well.  Appears that this was a result of mechanical fall and did not have any preceding symptoms that would suggest syncope.  No other signs of trauma at this time on physical exam.   Plan:  CT head CT C-spine  ED Summary/Re-evaluation:  No acute injuries were found on the patient's imaging.  CT noted dental disease that family was informed of dental disease that they are already working with a dentist about.  Patient also had lab work that was drawn that was unremarkable.  Had a chest x-ray and pelvis x-ray without any signs of injury.  Chest x-ray did show possible atelectasis and did talk to the patient again and it appears that he is not having any infectious symptoms or respiratory symptoms so feel pneumonia is highly unlikely.  Will have them follow-up with their primary doctor in several days regarding their symptoms.  This patient presents to the ED for concern of complaints listed in  HPI, this involves an extensive number of treatment options, and is a complaint that carries with it a high risk of complications and morbidity. Disposition including potential need for admission considered.   Dispo: DC Home. Return precautions discussed including, but not limited to, those listed in the AVS. Allowed pt time to ask questions which were answered fully prior to dc.  Additional history obtained from daughter Records reviewed Outpatient Clinic Notes The following labs were independently interpreted: Chemistry and show CKD I independently reviewed the following imaging with scope of interpretation limited to determining acute life threatening conditions related to emergency care: CT Head and agree with the radiologist interpretation with the following exceptions: none I personally reviewed and interpreted cardiac monitoring: normal sinus rhythm  I personally reviewed and interpreted the pt's EKG: see above for interpretation  I have reviewed the patients home medications and made adjustments as needed Social Determinants of health:  Elderly   Final diagnoses:  Fall, initial encounter  Minor head injury, initial encounter    ED Discharge Orders     None          Yolande Lamar BROCKS, MD 12/21/24 1223  "

## 2024-12-20 NOTE — ED Notes (Signed)
 Patient transported to CT

## 2024-12-21 ENCOUNTER — Other Ambulatory Visit: Payer: Self-pay | Admitting: Family

## 2024-12-21 ENCOUNTER — Other Ambulatory Visit: Payer: Self-pay

## 2024-12-21 ENCOUNTER — Other Ambulatory Visit (HOSPITAL_BASED_OUTPATIENT_CLINIC_OR_DEPARTMENT_OTHER): Payer: Self-pay

## 2024-12-21 ENCOUNTER — Other Ambulatory Visit (HOSPITAL_COMMUNITY): Payer: Self-pay

## 2024-12-21 MED ORDER — RANOLAZINE ER 500 MG PO TB12
500.0000 mg | ORAL_TABLET | Freq: Two times a day (BID) | ORAL | 1 refills | Status: AC
  Filled 2024-12-21: qty 180, 90d supply, fill #0

## 2024-12-21 MED ORDER — ISOSORBIDE MONONITRATE ER 120 MG PO TB24
120.0000 mg | ORAL_TABLET | Freq: Every day | ORAL | 1 refills | Status: AC
  Filled 2024-12-21: qty 30, 30d supply, fill #0

## 2024-12-23 ENCOUNTER — Other Ambulatory Visit: Payer: Self-pay

## 2024-12-24 ENCOUNTER — Other Ambulatory Visit: Payer: Self-pay

## 2024-12-25 ENCOUNTER — Other Ambulatory Visit (HOSPITAL_BASED_OUTPATIENT_CLINIC_OR_DEPARTMENT_OTHER): Payer: Self-pay

## 2024-12-25 ENCOUNTER — Other Ambulatory Visit: Payer: Self-pay

## 2024-12-26 ENCOUNTER — Encounter: Payer: Self-pay | Admitting: Neurology

## 2024-12-28 ENCOUNTER — Other Ambulatory Visit: Payer: Self-pay | Admitting: Family Medicine

## 2024-12-28 ENCOUNTER — Other Ambulatory Visit: Payer: Self-pay

## 2024-12-28 MED ORDER — NITROGLYCERIN 0.4 MG SL SUBL
0.4000 mg | SUBLINGUAL_TABLET | SUBLINGUAL | 2 refills | Status: AC | PRN
  Filled 2024-12-28: qty 25, 8d supply, fill #0

## 2024-12-30 ENCOUNTER — Other Ambulatory Visit: Payer: Self-pay

## 2024-12-30 ENCOUNTER — Other Ambulatory Visit (HOSPITAL_COMMUNITY): Payer: Self-pay

## 2024-12-31 ENCOUNTER — Other Ambulatory Visit: Payer: Self-pay

## 2024-12-31 ENCOUNTER — Other Ambulatory Visit (HOSPITAL_COMMUNITY): Payer: Self-pay

## 2025-02-01 ENCOUNTER — Encounter

## 2025-02-07 ENCOUNTER — Ambulatory Visit: Payer: Medicare Other | Admitting: Neurology

## 2025-04-04 ENCOUNTER — Ambulatory Visit: Admitting: Internal Medicine

## 2025-04-22 ENCOUNTER — Encounter: Admitting: Family Medicine
